# Patient Record
Sex: Female | Born: 1935 | Race: White | Hispanic: No | Smoking: Never smoker
Health system: Southern US, Community
[De-identification: ages and names within clinical notes are randomized; demographics above are authoritative.]

## PROBLEM LIST (undated history)

## (undated) DIAGNOSIS — G629 Polyneuropathy, unspecified: Secondary | ICD-10-CM

## (undated) DIAGNOSIS — I739 Peripheral vascular disease, unspecified: Secondary | ICD-10-CM

## (undated) DIAGNOSIS — Z9889 Other specified postprocedural states: Secondary | ICD-10-CM

## (undated) DIAGNOSIS — D649 Anemia, unspecified: Secondary | ICD-10-CM

## (undated) DIAGNOSIS — R55 Syncope and collapse: Secondary | ICD-10-CM

## (undated) DIAGNOSIS — R112 Nausea with vomiting, unspecified: Secondary | ICD-10-CM

## (undated) DIAGNOSIS — I10 Essential (primary) hypertension: Secondary | ICD-10-CM

## (undated) DIAGNOSIS — Z950 Presence of cardiac pacemaker: Secondary | ICD-10-CM

## (undated) DIAGNOSIS — K219 Gastro-esophageal reflux disease without esophagitis: Secondary | ICD-10-CM

## (undated) HISTORY — PX: COLONOSCOPY: SHX174

## (undated) HISTORY — DX: Anemia, unspecified: D64.9

## (undated) HISTORY — DX: Syncope and collapse: R55

## (undated) HISTORY — DX: Gastro-esophageal reflux disease without esophagitis: K21.9

---

## 1967-12-13 HISTORY — PX: APPENDECTOMY: SHX54

## 1967-12-13 HISTORY — PX: ABDOMINAL HYSTERECTOMY: SHX81

## 1999-12-13 HISTORY — PX: BACK SURGERY: SHX140

## 2002-12-12 HISTORY — PX: JOINT REPLACEMENT: SHX530

## 2002-12-12 HISTORY — PX: INSERT / REPLACE / REMOVE PACEMAKER: SUR710

## 2004-10-28 ENCOUNTER — Ambulatory Visit: Payer: Self-pay | Admitting: Unknown Physician Specialty

## 2004-11-08 ENCOUNTER — Ambulatory Visit: Payer: Self-pay | Admitting: Unknown Physician Specialty

## 2008-09-02 ENCOUNTER — Ambulatory Visit: Payer: Self-pay | Admitting: Specialist

## 2009-03-16 ENCOUNTER — Ambulatory Visit: Payer: Self-pay

## 2009-04-30 ENCOUNTER — Ambulatory Visit: Payer: Self-pay | Admitting: Specialist

## 2009-04-30 ENCOUNTER — Ambulatory Visit: Payer: Self-pay | Admitting: Cardiology

## 2009-05-07 ENCOUNTER — Ambulatory Visit: Payer: Self-pay | Admitting: Specialist

## 2009-08-06 ENCOUNTER — Ambulatory Visit: Payer: Self-pay | Admitting: Unknown Physician Specialty

## 2009-09-08 ENCOUNTER — Encounter: Payer: Self-pay | Admitting: Specialist

## 2009-09-11 ENCOUNTER — Encounter: Payer: Self-pay | Admitting: Specialist

## 2009-10-12 ENCOUNTER — Encounter: Payer: Self-pay | Admitting: Specialist

## 2010-01-17 ENCOUNTER — Inpatient Hospital Stay: Payer: Self-pay | Admitting: Internal Medicine

## 2010-01-22 ENCOUNTER — Ambulatory Visit: Payer: Self-pay | Admitting: Unknown Physician Specialty

## 2010-05-03 ENCOUNTER — Encounter: Payer: Self-pay | Admitting: Unknown Physician Specialty

## 2010-05-17 ENCOUNTER — Encounter: Payer: Self-pay | Admitting: Unknown Physician Specialty

## 2010-05-28 ENCOUNTER — Ambulatory Visit: Payer: Self-pay | Admitting: Neurological Surgery

## 2010-06-11 ENCOUNTER — Encounter: Payer: Self-pay | Admitting: Unknown Physician Specialty

## 2010-07-17 ENCOUNTER — Observation Stay: Payer: Self-pay | Admitting: Internal Medicine

## 2010-07-20 ENCOUNTER — Ambulatory Visit: Payer: Self-pay | Admitting: Neurology

## 2010-07-27 ENCOUNTER — Ambulatory Visit: Payer: Self-pay | Admitting: Unknown Physician Specialty

## 2010-09-06 ENCOUNTER — Encounter: Payer: Self-pay | Admitting: Internal Medicine

## 2010-09-11 ENCOUNTER — Encounter: Payer: Self-pay | Admitting: Internal Medicine

## 2010-10-07 ENCOUNTER — Ambulatory Visit: Payer: Self-pay | Admitting: Internal Medicine

## 2010-10-12 ENCOUNTER — Ambulatory Visit: Payer: Self-pay | Admitting: Internal Medicine

## 2010-10-15 ENCOUNTER — Ambulatory Visit: Payer: Self-pay | Admitting: Internal Medicine

## 2010-10-22 ENCOUNTER — Ambulatory Visit: Payer: Self-pay | Admitting: Internal Medicine

## 2010-10-29 ENCOUNTER — Ambulatory Visit: Payer: Self-pay | Admitting: Internal Medicine

## 2010-11-03 ENCOUNTER — Ambulatory Visit: Payer: Self-pay | Admitting: Internal Medicine

## 2010-11-10 ENCOUNTER — Ambulatory Visit: Payer: Self-pay | Admitting: Internal Medicine

## 2010-11-15 ENCOUNTER — Ambulatory Visit: Payer: Self-pay | Admitting: Internal Medicine

## 2010-11-18 ENCOUNTER — Ambulatory Visit: Payer: Self-pay | Admitting: Internal Medicine

## 2010-11-22 ENCOUNTER — Ambulatory Visit: Payer: Self-pay | Admitting: Internal Medicine

## 2010-11-29 ENCOUNTER — Ambulatory Visit: Payer: Self-pay | Admitting: Internal Medicine

## 2010-12-03 ENCOUNTER — Ambulatory Visit: Payer: Self-pay | Admitting: Internal Medicine

## 2010-12-08 ENCOUNTER — Ambulatory Visit: Payer: Self-pay

## 2011-03-09 ENCOUNTER — Encounter: Payer: Self-pay | Admitting: Neurological Surgery

## 2011-03-13 ENCOUNTER — Encounter: Payer: Self-pay | Admitting: Neurological Surgery

## 2011-04-12 ENCOUNTER — Encounter: Payer: Self-pay | Admitting: Neurological Surgery

## 2011-05-11 DIAGNOSIS — M069 Rheumatoid arthritis, unspecified: Secondary | ICD-10-CM | POA: Insufficient documentation

## 2011-10-11 ENCOUNTER — Encounter: Payer: Self-pay | Admitting: Neurological Surgery

## 2011-10-13 ENCOUNTER — Encounter: Payer: Self-pay | Admitting: Neurological Surgery

## 2011-11-21 ENCOUNTER — Ambulatory Visit: Payer: Self-pay | Admitting: Unknown Physician Specialty

## 2011-12-20 DIAGNOSIS — K602 Anal fissure, unspecified: Secondary | ICD-10-CM | POA: Insufficient documentation

## 2011-12-20 DIAGNOSIS — Z9889 Other specified postprocedural states: Secondary | ICD-10-CM | POA: Insufficient documentation

## 2012-04-02 ENCOUNTER — Ambulatory Visit: Payer: Self-pay | Admitting: Specialist

## 2012-04-04 ENCOUNTER — Ambulatory Visit: Payer: Self-pay | Admitting: Internal Medicine

## 2012-05-21 DIAGNOSIS — K8020 Calculus of gallbladder without cholecystitis without obstruction: Secondary | ICD-10-CM | POA: Insufficient documentation

## 2012-11-13 DIAGNOSIS — I447 Left bundle-branch block, unspecified: Secondary | ICD-10-CM | POA: Insufficient documentation

## 2012-11-21 ENCOUNTER — Ambulatory Visit: Payer: Self-pay | Admitting: Internal Medicine

## 2012-12-12 HISTORY — PX: PACEMAKER INSERTION: SHX728

## 2013-09-09 ENCOUNTER — Ambulatory Visit: Payer: Self-pay | Admitting: Unknown Physician Specialty

## 2013-11-12 DIAGNOSIS — I442 Atrioventricular block, complete: Secondary | ICD-10-CM | POA: Insufficient documentation

## 2013-11-22 ENCOUNTER — Ambulatory Visit: Payer: Self-pay | Admitting: Internal Medicine

## 2014-06-11 DIAGNOSIS — I10 Essential (primary) hypertension: Secondary | ICD-10-CM | POA: Diagnosis present

## 2014-06-11 DIAGNOSIS — D649 Anemia, unspecified: Secondary | ICD-10-CM | POA: Diagnosis present

## 2014-10-20 DIAGNOSIS — R1312 Dysphagia, oropharyngeal phase: Secondary | ICD-10-CM | POA: Insufficient documentation

## 2014-11-24 ENCOUNTER — Ambulatory Visit: Payer: Self-pay | Admitting: Internal Medicine

## 2015-01-07 ENCOUNTER — Ambulatory Visit: Payer: Self-pay | Admitting: Neurology

## 2015-01-29 ENCOUNTER — Ambulatory Visit: Payer: Self-pay | Admitting: Internal Medicine

## 2015-02-02 DIAGNOSIS — M5412 Radiculopathy, cervical region: Secondary | ICD-10-CM | POA: Insufficient documentation

## 2015-09-07 ENCOUNTER — Other Ambulatory Visit: Payer: Self-pay | Admitting: Internal Medicine

## 2015-09-07 DIAGNOSIS — Z1231 Encounter for screening mammogram for malignant neoplasm of breast: Secondary | ICD-10-CM

## 2015-09-10 ENCOUNTER — Encounter: Payer: Medicare Other | Attending: Surgery | Admitting: Surgery

## 2015-09-10 DIAGNOSIS — X58XXXA Exposure to other specified factors, initial encounter: Secondary | ICD-10-CM | POA: Diagnosis not present

## 2015-09-10 DIAGNOSIS — M109 Gout, unspecified: Secondary | ICD-10-CM | POA: Diagnosis not present

## 2015-09-10 DIAGNOSIS — M069 Rheumatoid arthritis, unspecified: Secondary | ICD-10-CM | POA: Diagnosis not present

## 2015-09-10 DIAGNOSIS — I10 Essential (primary) hypertension: Secondary | ICD-10-CM | POA: Diagnosis not present

## 2015-09-10 DIAGNOSIS — M1990 Unspecified osteoarthritis, unspecified site: Secondary | ICD-10-CM | POA: Insufficient documentation

## 2015-09-10 DIAGNOSIS — T23272A Burn of second degree of left wrist, initial encounter: Secondary | ICD-10-CM | POA: Insufficient documentation

## 2015-09-10 NOTE — Progress Notes (Signed)
[name], [name] ([id number]) Visit Report for 09/10/2015 Abuse/Suicide Risk Screen Details Patient Name: [name], [name] 09/10/2015 10:15 Date of Service: AM Medical Record [id number] Number: Patient Account Number: [id number] [birth year] (81 y.o. Treating RN: Clover Mealy, RN, BSN, [place] [facility] Date of Birth/Sex: Female) Other Clinician: Primary Care Physician: Aram Beecham Treating Britto, Errol Referring Physician: Physician/Extender: Tania Ade in Treatment: 0 Abuse/Suicide Risk Screen Items Answer ABUSE/SUICIDE RISK SCREEN: Has anyone close to you tried to hurt or harm you recentlyo No Do you feel uncomfortable with anyone in your familyo No Has anyone forced you do things that you didnot want to doo No Do you have any thoughts of harming yourselfo No Patient displays signs or symptoms of abuse and/or neglect. No Electronic Signature(s) Signed: 09/10/2015 10:28:19 AM By: Elpidio Eric BSN, RN Entered By: Elpidio Eric on 09/10/2015 10:28:19 [name] ([id number]) -------------------------------------------------------------------------------- Activities of Daily Living Details Patient Name: [name], [name] 09/10/2015 10:15 Date of Service: AM Medical Record [id number] Number: Patient Account Number: [id number] 10/15/1930 (81 y.o. Treating RN: Clover Mealy, RN, BSN, [place] [facility] Date of Birth/Sex: Female) Other Clinician: Primary Care Physician: Aram Beecham Treating Evlyn Kanner Referring Physician: Physician/Extender: Tania Ade in Treatment: 0 Activities of Daily Living Items Answer Activities of Daily Living (Please select one for each item) Drive Automobile Completely Able Take Medications Completely Able Use Telephone Completely Able Care for Appearance Completely Able Use Toilet Completely Able Bath / Shower Completely Able Dress Self Completely Able Feed Self Completely Able Walk Completely Able Get In / Out Bed Completely Able Housework Completely Able Prepare Meals Completely  Able Handle Money Completely Able Shop for Self Completely Able Electronic Signature(s) Signed: 09/10/2015 10:28:56 AM By: Elpidio Eric BSN, RN Entered By: Elpidio Eric on 09/10/2015 10:28:55 [name] ([id number]) -------------------------------------------------------------------------------- Education Assessment Details Patient Name: [name], [name] 09/10/2015 10:15 Date of Service: AM Medical Record [id number] Number: Patient Account Number: [id number] Jan 17, 1930 (81 y.o. Treating RN: Clover Mealy, RN, BSN, [place] Date of Birth/Sex: Female) Other Clinician: Primary Care Physician: Aram Beecham Treating Evlyn Kanner Referring Physician: Physician/Extender: Tania Ade in Treatment: 0 Primary Learner Assessed: Patient Learning Preferences/Education Level/Primary Language Learning Preference: Explanation Highest Education Level: College or Above Preferred Language: English Cognitive Barrier Assessment/Beliefs Language Barrier: No Physical Barrier Assessment Impaired Vision: No Impaired Hearing: Yes Hearing Aid Decreased Hand dexterity: No Knowledge/Comprehension Assessment Knowledge Level: High Comprehension Level: High Ability to understand written High instructions: Ability to understand verbal High instructions: Motivation Assessment Anxiety Level: Calm Cooperation: Cooperative Education Importance: Acknowledges Need Interest in Health Problems: Asks Questions Perception: Coherent Willingness to Engage in Self- High Management Activities: Readiness to Engage in Self- High Management Activities: Electronic Signature(s) Signed: 09/10/2015 10:29:44 AM By: Elpidio Eric BSN, RN Entered By: Elpidio Eric on 09/10/2015 10:29:43 [name] ([id number]) [name], [name] ([id number]) -------------------------------------------------------------------------------- Fall Risk Assessment Details Patient Name: [name], [name] 09/10/2015 10:15 Date of Service: AM Medical  Record [id number] Number: Patient Account Number: [id number] 1930-07-23 (81 y.o. Treating RN: Clover Mealy, RN, BSN, [place] [facility] Date of Birth/Sex: Female) Other Clinician: Primary Care Physician: Aram Beecham Treating Britto, Errol Referring Physician: Physician/Extender: Tania Ade in Treatment: 0 Fall Risk Assessment Items FALL RISK ASSESSMENT: History of falling - immediate or within 3 months 0 No Secondary diagnosis 0 No Ambulatory aid None/bed rest/wheelchair/nurse 0 Yes Crutches/cane/walker 0 No Furniture 0 No IV Access/Saline Lock 0 No Gait/Training Normal/bed rest/immobile 0 Yes Weak 0 No Impaired 0 No Mental Status Oriented to own ability 0 Yes Electronic Signature(s) Signed: 09/10/2015 10:29:54 AM By: Elpidio Eric BSN, RN Entered  ByElpidio Eric on 09/10/2015 10:29:54 [name] ([id number]) -------------------------------------------------------------------------------- Foot Assessment Details Patient Name: [name], [name] 09/10/2015 10:15 Date of Service: AM Medical Record [id number] Number: Patient Account Number: [id number] 22-Jul-1930 (81 y.o. Treating RN: Clover Mealy, RN, BSN, [place] Date of Birth/Sex: Female) Other Clinician: Primary Care Physician: Aram Beecham Treating Britto, Errol Referring Physician: Physician/Extender: Tania Ade in Treatment: 0 Foot Assessment Items Site Locations + = Sensation present, - = Sensation absent, C = Callus, U = Ulcer R = Redness, W = Warmth, M = Maceration, PU = Pre-ulcerative lesion F = Fissure, S = Swelling, D = Dryness Assessment Right: Left: Other Deformity: No No Prior Foot Ulcer: No No Prior Amputation: No No Charcot Joint: No No Ambulatory Status: Ambulatory Without Help Gait: Steady Electronic Signature(s) Signed: 09/10/2015 10:30:19 AM By: Elpidio Eric BSN, RN Entered By: Elpidio Eric on 09/10/2015 10:30:19 [name]  ([id number]) -------------------------------------------------------------------------------- Nutrition Risk Assessment Details Patient Name: [name], [name] 09/10/2015 10:15 Date of Service: AM Medical Record [id number] Number: Patient Account Number: [id number] 1930/08/28 (81 y.o. Treating RN: Afful, RN, BSN, [place] Date of Birth/Sex: Female) Other Clinician: Primary Care Physician: Aram Beecham Treating Britto, Errol Referring Physician: Physician/Extender: Tania Ade in Treatment: 0 Height (in): 61 Weight (lbs): 128 Body Mass Index (BMI): 24.2 Nutrition Risk Assessment Items NUTRITION RISK SCREEN: I have an illness or condition that made me change the kind and/or 0 No amount of food I eat I eat fewer than two meals per day 0 No I eat few fruits and vegetables, or milk products 0 No I have three or more drinks of beer, liquor or wine almost every day 0 No I have tooth or mouth problems that make it hard for me to eat 0 No I don't always have enough money to buy the food I need 0 No I eat alone most of the time 0 No I take three or more different prescribed or over-the-counter drugs a 0 No day Without wanting to, I have lost or gained 10 pounds in the last six 0 No months I am not always physically able to shop, cook and/or feed myself 0 No Nutrition Protocols Good Risk Protocol 0 No interventions needed Moderate Risk Protocol Electronic Signature(s) Signed: 09/10/2015 10:30:10 AM By: Elpidio Eric BSN, RN Entered By: Elpidio Eric on 09/10/2015 10:30:10

## 2015-09-10 NOTE — Progress Notes (Signed)
[name], [name] ([id number]) Visit Report for 09/10/2015 Allergy List Details Patient Name: [name], [name]. Date of Service: 09/10/2015 10:15 AM Medical Record Number: [id number] Patient Account Number: [id number] Date of Birth/Sex: 1930-05-26 (81 y.o. Female) Treating RN: Afful, RN, BSN, [place] Sink Primary Care Physician: Aram Beecham Other Clinician: Referring Physician: Treating Physician/Extender: Rudene Re in Treatment: 0 Allergies Active Allergies Penicillins Reaction: rash Severity: Moderate Sporanox Reaction: elevated LFT Severity: Moderate adhesive Reaction: skin alergy Severity: Moderate codeine Reaction: n/v Severity: Moderate Cipro Reaction: diarrhea Severity: Moderate Allergy Notes Electronic Signature(s) Signed: 09/10/2015 10:23:15 AM By: Elpidio Eric BSN, RN Entered By: Elpidio Eric on 09/10/2015 10:23:14 [name] ([id number]) -------------------------------------------------------------------------------- Arrival Information Details Patient Name: [name] Date of Service: 09/10/2015 10:15 AM Medical Record Number: [id number] Patient Account Number: [id number] Date of Birth/Sex: 24-Dec-1929 (81 y.o. Female) Treating RN: Clover Mealy, RN, BSN, [place] Sink Primary Care Physician: Aram Beecham Other Clinician: Referring Physician: Treating Physician/Extender: Rudene Re in Treatment: 0 Visit Information Patient Arrived: Ambulatory Arrival Time: 10:26 Accompanied By: SELF Transfer Assistance: None Patient Identification Verified: Yes Secondary Verification Process Yes Completed: Patient Requires Transmission-Based No Precautions: Patient Has Alerts: No History Since Last Visit Added or deleted any medications: No Any new allergies or adverse reactions: No Had a fall or experienced change in activities of daily living that may affect risk of falls: No Signs or symptoms of abuse/neglect since last visito No Hospitalized since last  visit: No Has Dressing in Place as Prescribed: Yes Pain Present Now: No Electronic Signature(s) Signed: 09/10/2015 10:27:21 AM By: Elpidio Eric BSN, RN Entered By: Elpidio Eric on 09/10/2015 10:27:21 [name] ([id number]) -------------------------------------------------------------------------------- Clinic Level of Care Assessment Details Patient Name: [name] Date of Service: 09/10/2015 10:15 AM Medical Record Number: [id number] Patient Account Number: [id number] Date of Birth/Sex: 1930-03-25 (81 y.o. Female) Treating RN: Afful, RN, BSN, [place] Sink Primary Care Physician: Aram Beecham Other Clinician: Referring Physician: Treating Physician/Extender: Rudene Re in Treatment: 0 Clinic Level of Care Assessment Items TOOL 2 Quantity Score []  - Use when only an EandM is performed on the INITIAL visit 0 ASSESSMENTS - Nursing Assessment / Reassessment X - General Physical Exam (combine w/ comprehensive assessment (listed just 1 20 below) when performed on new pt. evals) X - Comprehensive Assessment (HX, ROS, Risk Assessments, Wounds Hx, etc.) 1 25 ASSESSMENTS - Wound and Skin Assessment / Reassessment X - Simple Wound Assessment / Reassessment - one wound 1 5 []  - Complex Wound Assessment / Reassessment - multiple wounds 0 []  - Dermatologic / Skin Assessment (not related to wound area) 0 ASSESSMENTS - Ostomy and/or Continence Assessment and Care []  - Incontinence Assessment and Management 0 []  - Ostomy Care Assessment and Management (repouching, etc.) 0 PROCESS - Coordination of Care X - Simple Patient / Family Education for ongoing care 1 15 []  - Complex (extensive) Patient / Family Education for ongoing care 0 X - Staff obtains , Records, Test Results / Process Orders 1 10 []  - Staff telephones HHA, Nursing Homes / Clarify orders / etc 0 []  - Routine Transfer to another Facility (non-emergent condition) 0 []  - Routine Hospital Admission (non-emergent  condition) 0 []  - New Admissions / / Ordering NPWT, Apligraf, etc. 0 []  - Emergency Hospital Admission (emergent condition) 0 X - Simple Discharge Coordination 1 10 CATERIN, TABARES V. ( ) []  - Complex (extensive) Discharge Coordination 0 PROCESS - Special Needs []  - Pediatric / Minor Patient Management 0 []  - Isolation Patient Management 0 []  -  Hearing / Language / Visual special needs 0 []  - Assessment of Community assistance (transportation, D/C planning, etc.) 0 []  - Additional assistance / Altered mentation 0 []  - Support Surface(s) Assessment (bed, cushion, seat, etc.) 0 INTERVENTIONS - Wound Cleansing / Measurement X - Wound Imaging (photographs - any number of wounds) 1 5 []  - Wound Tracing (instead of photographs) 0 X - Simple Wound Measurement - one wound 1 5 []  - Complex Wound Measurement - multiple wounds 0 []  - Simple Wound Cleansing - one wound 0 []  - Complex Wound Cleansing - multiple wounds 0 INTERVENTIONS - Wound Dressings X - Small Wound Dressing one or multiple wounds 1 10 []  - Medium Wound Dressing one or multiple wounds 0 []  - Large Wound Dressing one or multiple wounds 0 []  - Application of Medications - injection 0 INTERVENTIONS - Miscellaneous []  - External ear exam 0 []  - Specimen Collection (cultures, biopsies, blood, body fluids, etc.) 0 []  - Specimen(s) / Culture(s) sent or taken to Lab for analysis 0 []  - Patient Transfer (multiple staff / / Similar devices) 0 []  - Simple Staple / Suture removal (25 or less) 0 []  - Complex Staple / Suture removal (26 or more) 0 DELAYNA, SPARLIN V. ( ) []  - Hypo / Hyperglycemic Management (close monitor of Blood Glucose) 0 []  - Ankle / Brachial Index (ABI) - do not check if billed separately 0 Has the patient been seen at the hospital within the last three years: Yes Total Score: 105 Level Of Care: New/Established - Level 3 Electronic Signature(s) Signed: 09/10/2015  10:42:40 AM By: BSN, RN Entered By: on 09/10/2015 10:42:40 ( ) -------------------------------------------------------------------------------- Encounter Discharge Information Details Patient Name: Date of Service: 09/10/2015 10:15 AM Medical Record Number: Patient Account Number: Date of Birth/Sex: 01-17-1930 (81 y.o. Female) Treating RN: , RN, BSN, Primary Care Physician: Candiss Norse Other Clinician: Referring Physician: Treating Physician/Extender: [id number] in Treatment: 0 Encounter Discharge Information Items Discharge Pain Level: 0 Discharge Condition: Stable Ambulatory Status: Ambulatory Discharge Destination: Home Transportation: Private Auto Accompanied By: self Schedule Follow-up Appointment: No Medication Reconciliation completed and provided to Patient/Care No Kirsta Probert: Provided on Clinical Summary of Care: 09/10/2015 Form Type Recipient Paper Patient [facility] Electronic Signature(s) Signed: 09/10/2015 10:50:08 AM By: Elpidio Eric Previous Signature: 09/10/2015 10:40:02 AM Version By: 09/12/2015 BSN, RN Entered By: [name] on 09/10/2015 10:50:08 [name] (09/12/2015) -------------------------------------------------------------------------------- Lower Extremity Assessment Details Patient Name: [id number] Date of Service: 09/10/2015 10:15 AM Medical Record Number: 13/05/1930 Patient Account Number: 05-05-2006 Date of Birth/Sex: 12-29-1929 (81 y.o. Female) Treating RN: Afful, RN, BSN, Aram Beecham Primary Care Physician: Rudene Re Other Clinician: Referring Physician: Treating Physician/Extender: 09/12/2015 in Treatment: 0 Electronic Signature(s) Signed: 09/10/2015 10:30:42 AM By: 09/12/2015 BSN, RN Entered By: Gwenlyn Perking on 09/10/2015 10:30:42 Elpidio Eric  (Gwenlyn Perking) -------------------------------------------------------------------------------- Multi Wound Chart Details Patient Name: 09/12/2015 Date of Service: 09/10/2015 10:15 AM Medical Record Number: [id number] Patient Account Number: [name] Date of Birth/Sex: [birth year] (81 y.o. Female) Treating RN: [id number], RN, BSN, 13/05/1930 Primary Care Physician: 05-05-2006 Other Clinician: Referring Physician: Treating Physician/Extender: [place] Sink in Treatment: 0 Vital Signs Height(in): 61 Pulse(bpm): 67 Weight(lbs): 128 Blood Pressure 111/42 (mmHg): Body Mass Index(BMI): 24 Temperature(F): 97.8 Respiratory Rate 16 (breaths/min): Photos: [2:No Photos] [N/A:N/A] Wound Location: [2:Left Wrist] [N/A:N/A] Wounding Event: [2:Shear/Friction] [N/A:N/A] Primary Etiology: [2:1st degree Burn] [N/A:N/A] Comorbid History: [2:Hypertension, Gout, Rheumatoid  Arthritis, Osteoarthritis] [N/A:N/A] Date Acquired: [2:09/02/2015] [N/A:N/A] Weeks of Treatment: [2:0] [N/A:N/A] Wound Status: [2:Open] [N/A:N/A] Measurements L x W x D 1x1.1x0.1 [N/A:N/A] (cm) Area (cm) : [2:0.864] [N/A:N/A] Volume (cm) : [2:0.086] [N/A:N/A] % Reduction in Area: [2:0.00%] [N/A:N/A] % Reduction in Volume: 0.00% [N/A:N/A] Classification: [2:Partial Thickness] [N/A:N/A] Exudate Amount: [2:Small] [N/A:N/A] Wound Margin: [2:Distinct, outline attached] [N/A:N/A] Granulation Amount: [2:Medium (34-66%)] [N/A:N/A] Granulation Quality: [2:Pale] [N/A:N/A] Necrotic Amount: [2:Small (1-33%)] [N/A:N/A] Exposed Structures: [2:Fascia: No Fat: No Tendon: No Muscle: No Joint: No Bone: No Limited to Skin Breakdown] [N/A:N/A] Epithelialization: None N/A N/A Periwound Skin Texture: Edema: No N/A N/A Excoriation: No Induration: No Callus: No Crepitus: No Fluctuance: No Friable: No Rash: No Scarring: No Periwound Skin Moist: Yes N/A N/A Moisture: Maceration: No Dry/Scaly: No Periwound Skin Color: Atrophie  Blanche: No N/A N/A Cyanosis: No Ecchymosis: No Erythema: No Hemosiderin Staining: No Mottled: No Pallor: No Rubor: No Temperature: No Abnormality N/A N/A Tenderness on No N/A N/A Palpation: Wound Preparation: Ulcer Cleansing: N/A N/A Rinsed/Irrigated with Saline Topical Anesthetic Applied: Other: LIDOCAINE 4% Treatment Notes Electronic Signature(s) Signed: 09/10/2015 10:38:36 AM By: Elpidio Eric BSN, RN Entered By: Elpidio Eric on 09/10/2015 10:38:36 [name] ([id number]) -------------------------------------------------------------------------------- Multi-Disciplinary Care Plan Details Patient Name: [name] Date of Service: 09/10/2015 10:15 AM Medical Record Number: [id number] Patient Account Number: [id number] Date of Birth/Sex: Mar 30, 1930 (81 y.o. Female) Treating RN: Afful, RN, BSN, [place] Sink Primary Care Physician: Aram Beecham Other Clinician: Referring Physician: Treating Physician/Extender: Rudene Re in Treatment: 0 Active Inactive Orientation to the Wound Care Program Nursing Diagnoses: Knowledge deficit related to the wound healing center program Goals: Patient/caregiver will verbalize understanding of the Wound Healing Center Program Date Initiated: 09/10/2015 Goal Status: Active Interventions: Provide education on orientation to the wound center Notes: Wound/Skin Impairment Nursing Diagnoses: Knowledge deficit related to ulceration/compromised skin integrity Goals: Patient/caregiver will verbalize understanding of skin care regimen Date Initiated: 09/10/2015 Goal Status: Active Ulcer/skin breakdown will have a volume reduction of 30% by week 4 Date Initiated: 09/10/2015 Goal Status: Active Ulcer/skin breakdown will have a volume reduction of 50% by week 8 Date Initiated: 09/10/2015 Goal Status: Active Ulcer/skin breakdown will have a volume reduction of 80% by week 12 Date Initiated: 09/10/2015 Goal Status: Active Ulcer/skin  breakdown will heal within 14 weeks Date Initiated: 09/10/2015 Goal Status: Active REITA, SHINDLER ([id number]) Interventions: Assess patient/caregiver ability to obtain necessary supplies Assess patient/caregiver ability to perform ulcer/skin care regimen upon admission and as needed Assess ulceration(s) every visit Provide education on ulcer and skin care Treatment Activities: Skin care regimen initiated : 09/10/2015 Topical wound management initiated : 09/10/2015 Notes: Electronic Signature(s) Signed: 09/10/2015 10:37:46 AM By: Elpidio Eric BSN, RN Entered By: Elpidio Eric on 09/10/2015 10:37:46 [name] ([id number]) -------------------------------------------------------------------------------- Pain Assessment Details Patient Name: [name] Date of Service: 09/10/2015 10:15 AM Medical Record Number: [id number] Patient Account Number: [id number] Date of Birth/Sex: 06-May-1930 (81 y.o. Female) Treating RN: Clover Mealy, RN, BSN, [place] Sink Primary Care Physician: Aram Beecham Other Clinician: Referring Physician: Treating Physician/Extender: Rudene Re in Treatment: 0 Active Problems Location of Pain Severity and Description of Pain Patient Has Paino No Site Locations Pain Management and Medication Current Pain Management: Electronic Signature(s) Signed: 09/10/2015 10:27:37 AM By: Elpidio Eric BSN, RN Entered By: Elpidio Eric on 09/10/2015 10:27:37 [name] ([id number]) -------------------------------------------------------------------------------- Patient/Caregiver Education Details Patient Name: [name] Date of Service: 09/10/2015 10:15 AM Medical Record Number: [id number] Patient Account Number: [id number] Date of Birth/Gender: 04-Aug-1930 (81 y.o.  Female) Treating RN: Clover Mealy, RN, BSN, [place] Sink Primary Care Physician: Aram Beecham Other Clinician: Referring Physician: Treating Physician/Extender: Rudene Re in Treatment: 0 Education  Assessment Education Provided To: Patient Education Topics Provided Basic Hygiene: Methods: Explain/Verbal Responses: State content correctly Welcome To The [facility]: Methods: Explain/Verbal Responses: State content correctly Wound/Skin Impairment: Methods: Explain/Verbal Responses: State content correctly Electronic Signature(s) Signed: 09/10/2015 10:40:21 AM By: Elpidio Eric BSN, RN Entered By: Elpidio Eric on 09/10/2015 10:40:21 [name] ([id number]) -------------------------------------------------------------------------------- Wound Assessment Details Patient Name: [name] Date of Service: 09/10/2015 10:15 AM Medical Record Number: [id number] Patient Account Number: [id number] Date of Birth/Sex: 02/09/1930 (81 y.o. Female) Treating RN: Afful, RN, BSN, [place] Primary Care Physician: Aram Beecham Other Clinician: Referring Physician: Treating Physician/Extender: Rudene Re in Treatment: 0 Wound Status Wound Number: 2 Primary 1st degree Burn Etiology: Wound Location: Left Wrist Wound Open Wounding Event: Shear/Friction Status: Date Acquired: 09/02/2015 Comorbid Hypertension, Gout, Rheumatoid Weeks Of Treatment: 0 History: Arthritis, Osteoarthritis Clustered Wound: No Photos Photo Uploaded By: Curtis Sites on 09/10/2015 10:58:07 Wound Measurements Length: (cm) 1 Width: (cm) 1.1 Depth: (cm) 0.1 Area: (cm) 0.864 Volume: (cm) 0.086 % Reduction in Area: 0% % Reduction in Volume: 0% Epithelialization: None Tunneling: No Undermining: No Wound Description Classification: Partial Thickness Wound Margin: Distinct, outline attached Exudate Amount: Small Foul Odor After Cleansing: No Wound Bed Granulation Amount: Medium (34-66%) Exposed Structure Granulation Quality: Pale Fascia Exposed: No Necrotic Amount: Small (1-33%) Fat Layer Exposed: No Necrotic Quality: Adherent Slough Tendon Exposed: No Muscle Exposed: No Joint  Exposed: No VIRA, BILSON ([id number]) Bone Exposed: No Limited to Skin Breakdown Periwound Skin Texture Texture Color No Abnormalities Noted: No No Abnormalities Noted: No Callus: No Atrophie Blanche: No Crepitus: No Cyanosis: No Excoriation: No Ecchymosis: No Fluctuance: No Erythema: No Friable: No Hemosiderin Staining: No Induration: No Mottled: No Localized Edema: No Pallor: No Rash: No Rubor: No Scarring: No Temperature / Pain Moisture Temperature: No Abnormality No Abnormalities Noted: No Dry / Scaly: No Maceration: No Moist: Yes Wound Preparation Ulcer Cleansing: Rinsed/Irrigated with Saline Topical Anesthetic Applied: Other: LIDOCAINE 4%, Electronic Signature(s) Signed: 09/10/2015 10:32:58 AM By: Elpidio Eric BSN, RN Entered By: Elpidio Eric on 09/10/2015 10:32:58 [name] ([id number]) -------------------------------------------------------------------------------- Vitals Details Patient Name: [name] Date of Service: 09/10/2015 10:15 AM Medical Record Number: [id number] Patient Account Number: [id number] Date of Birth/Sex: 04-27-1930 (81 y.o. Female) Treating RN: Afful, RN, BSN, [place] Primary Care Physician: Aram Beecham Other Clinician: Referring Physician: Treating Physician/Extender: Rudene Re in Treatment: 0 Vital Signs Time Taken: 10:27 Temperature (F): 97.8 Height (in): 61 Pulse (bpm): 67 Source: Stated Respiratory Rate (breaths/min): 16 Weight (lbs): 128 Blood Pressure (mmHg): 111/42 Source: Stated Reference Range: 80 - 120 mg / dl Body Mass Index (BMI): 24.2 Electronic Signature(s) Signed: 09/10/2015 10:33:44 AM By: Elpidio Eric BSN, RN Previous Signature: 09/10/2015 10:28:05 AM Version By: Elpidio Eric BSN, RN Entered By: Elpidio Eric on 09/10/2015 10:33:44

## 2015-09-11 NOTE — Progress Notes (Signed)
SUANN, EGGLETON ([id number]) Visit Report for 09/10/2015 Chief Complaint Document Details Patient Name: [name], [name] 09/10/2015 10:15 Date of Service: AM Medical Record [id number] Number: Patient Account Number: [id number] 12/06/1930 (81 y.o. Treating RN: Clover Mealy, RN, BSN, [place] Sink Date of Birth/Sex: Female) Other Clinician: Primary Care Physician: Aram Beecham Treating Evlyn Kanner Referring Physician: Physician/Extender: Tania Ade in Treatment: 0 Information Obtained from: Patient Chief Complaint Patient presents to the wound care center with burn wound too her left wrist which she sustained about a week ago. Electronic Signature(s) Signed: 09/10/2015 10:47:49 AM By: Evlyn Kanner MD, FACS Previous Signature: 09/10/2015 10:47:37 AM Version By: Evlyn Kanner MD, FACS Entered By: Evlyn Kanner on 09/10/2015 10:47:48 [name] ([id number]) -------------------------------------------------------------------------------- HPI Details Patient Name: [name], [name] 09/10/2015 10:15 Date of Service: AM Medical Record [id number] Number: Patient Account Number: [id number] June 28, 1930 (81 y.o. Treating RN: Afful, RN, BSN, [place] Sink Date of Birth/Sex: Female) Other Clinician: Primary Care Physician: Aram Beecham Treating Evlyn Kanner Referring Physician: Physician/Extender: Tania Ade in Treatment: 0 History of Present Illness Location: left medial wrist Quality: Patient reports experiencing an aching pain to affected area(s). Severity: Patient states wound (s) are getting better. Duration: Patient has had the wound for < 2 weeks prior to presenting for treatment Timing: Pain in wound is Intermittent (comes and goes Context: The wound occurred when the patient burned herself with a hot dish coming out of the abdomen Modifying Factors: Other treatment(s) tried include: has been applying some Neosporin ointment over this Associated Signs and Symptoms: Patient reports having: no bleeding or  discharge from the wound. HPI Description: 81 year old patient who is looking much younger than his stated age comes in with a history of having a burn to her medial left wrist which she sustained about a week ago. She has several medical comorbidities including degenerative arthritis, scoliosis, history of back surgery, pacemaker placement,AMA positive, ulnar neuropathy and left carpal tunnel syndrome. she is also had sclerotherapy for varicose veins in May 2003. her medications include some prednisone at the present time which she may be coming off soon. Electronic Signature(s) Signed: 09/10/2015 10:53:27 AM By: Evlyn Kanner MD, FACS Entered By: Evlyn Kanner on 09/10/2015 10:53:27 [name] ([id number]) -------------------------------------------------------------------------------- Physical Exam Details Patient Name: [name], [name] 09/10/2015 10:15 Date of Service: AM Medical Record [id number] Number: Patient Account Number: [id number] 08/31/1930 (81 y.o. Treating RN: Clover Mealy, RN, BSN, [place] Sink Date of Birth/Sex: Female) Other Clinician: Primary Care Physician: Aram Beecham Treating Evlyn Kanner Referring Physician: Physician/Extender: Tania Ade in Treatment: 0 Constitutional . Pulse regular. Respirations normal and unlabored. Afebrile. . Eyes Nonicteric. Reactive to light. Ears, Nose, Mouth, and Throat Lips, teeth, and gums WNL.Marland Kitchen Moist mucosa without lesions . Neck supple and nontender. No palpable supraclavicular or cervical adenopathy. Normal sized without goiter. Respiratory WNL. No retractions.. Cardiovascular Pedal Pulses WNL. No clubbing, cyanosis or edema. Gastrointestinal (GI) Abdomen without masses or tenderness.. No liver or spleen enlargement or tenderness.. Lymphatic No adneopathy. No adenopathy. No adenopathy. Musculoskeletal Adexa without tenderness or enlargement.. Digits and nails w/o clubbing, cyanosis, infection, petechiae, ischemia, or inflammatory  conditions.. Integumentary (Hair, Skin) No suspicious lesions. No crepitus or fluctuance. No peri-wound warmth or erythema. No masses.Marland Kitchen Psychiatric Judgement and insight Intact.. No evidence of depression, anxiety, or agitation.. Notes She has a second-degree burn on the medial left wrist which has brisk bleeding on gently wiping it off with saline gauze. Electronic Signature(s) Signed: 09/10/2015 10:54:14 AM By: Evlyn Kanner MD, FACS Entered By: Evlyn Kanner on 09/10/2015 10:54:12 [name] ([id number]) --------------------------------------------------------------------------------  Physician Orders Details Patient Name: [name], [name] 09/10/2015 10:15 Date of Service: AM Medical Record [id number] Number: Patient Account Number: [id number] 02-14-1930 (81 y.o. Treating RN: Clover Mealy, RN, BSN, [place] Sink Date of Birth/Sex: Female) Other Clinician: Primary Care Physician: Aram Beecham Treating Evlyn Kanner Referring Physician: Physician/Extender: Tania Ade in Treatment: 0 Verbal / Phone Orders: Yes Clinician: Afful, RN, BSN, [place] Read Back and Verified: Yes Diagnosis Coding Wound Cleansing Wound #2 Left Wrist o Cleanse wound with mild soap and water o May Shower, gently pat wound dry prior to applying new dressing. o May shower with protection. Anesthetic Wound #2 Left Wrist o Topical Lidocaine 4% cream applied to wound bed prior to debridement Primary Wound Dressing o Silvadene Cream Secondary Dressing Wound #2 Left Wrist o Gauze and Kerlix/Conform Dressing Change Frequency Wound #2 Left Wrist o Change dressing every day. Follow-up Appointments Wound #2 Left Wrist o Return Appointment in 1 week. Electronic Signature(s) Signed: 09/10/2015 10:41:26 AM By: Elpidio Eric BSN, RN Signed: 09/10/2015 5:16:51 PM By: Evlyn Kanner MD, FACS Entered By: Elpidio Eric on 09/10/2015 10:41:26 [name]  ([id number]) -------------------------------------------------------------------------------- Problem List Details Patient Name: [name], [name] 09/10/2015 10:15 Date of Service: AM Medical Record [id number] Number: Patient Account Number: [id number] July 23, 1930 (81 y.o. Treating RN: Clover Mealy, RN, BSN, [place] Sink Date of Birth/Sex: Female) Other Clinician: Primary Care Physician: Aram Beecham Treating Evlyn Kanner Referring Physician: Physician/Extender: Tania Ade in Treatment: 0 Active Problems ICD-10 Encounter Code Description Active Date Diagnosis T23.272A Burn of second degree of left wrist, initial encounter 09/10/2015 Yes Inactive Problems Resolved Problems Electronic Signature(s) Signed: 09/10/2015 10:47:02 AM By: Evlyn Kanner MD, FACS Entered By: Evlyn Kanner on 09/10/2015 10:47:02 [name] ([id number]) -------------------------------------------------------------------------------- Progress Note Details Patient Name: [name], [name] 09/10/2015 10:15 Date of Service: AM Medical Record [id number] Number: Patient Account Number: [id number] 06/04/1930 (81 y.o. Treating RN: Clover Mealy, RN, BSN, [place] Sink Date of Birth/Sex: Female) Other Clinician: Primary Care Physician: Aram Beecham Treating Evlyn Kanner Referring Physician: Physician/Extender: Tania Ade in Treatment: 0 Subjective Chief Complaint Information obtained from Patient Patient presents to the wound care center with burn wound too her left wrist which she sustained about a week ago. History of Present Illness (HPI) The following HPI elements were documented for the patient's wound: Location: left medial wrist Quality: Patient reports experiencing an aching pain to affected area(s). Severity: Patient states wound (s) are getting better. Duration: Patient has had the wound for < 2 weeks prior to presenting for treatment Timing: Pain in wound is Intermittent (comes and goes Context: The wound occurred when the  patient burned herself with a hot dish coming out of the abdomen Modifying Factors: Other treatment(s) tried include: has been applying some Neosporin ointment over this Associated Signs and Symptoms: Patient reports having: no bleeding or discharge from the wound. 81 year old patient who is looking much younger than his stated age comes in with a history of having a burn to her medial left wrist which she sustained about a week ago. She has several medical comorbidities including degenerative arthritis, scoliosis, history of back surgery, pacemaker placement,AMA positive, ulnar neuropathy and left carpal tunnel syndrome. she is also had sclerotherapy for varicose veins in May 2003. her medications include some prednisone at the present time which she may be coming off soon. Patient History Information obtained from Patient. Allergies Penicillins (Severity: Moderate, Reaction: rash), Sporanox (Severity: Moderate, Reaction: elevated LFT), adhesive (Severity: Moderate, Reaction: skin alergy), codeine (Severity: Moderate, Reaction: n/Barnett), Cipro (Severity: Moderate, Reaction: diarrhea) Family History Cancer - Mother, Siblings,  Diabetes - Father, Heart Disease - Father, Siblings, Hypertension - Father, Siblings, Lung Disease - Siblings, [name], [name] ([id number]) No family history of Hereditary Spherocytosis, Kidney Disease, Seizures, Stroke, Thyroid Problems, Tuberculosis. Social History Never smoker, Marital Status - Married, Alcohol Use - Never, Drug Use - No History, Caffeine Use - Never. Medical And Surgical History Notes Ear/Nose/Mouth/Throat bilateral hearing aides Cardiovascular pacemaker Review of Systems (ROS) Eyes Complains or has symptoms of Glasses / Contacts. Ear/Nose/Mouth/Throat The patient has no complaints or symptoms. Hematologic/Lymphatic The patient has no complaints or symptoms. Respiratory The patient has no complaints or symptoms. Cardiovascular The patient  has no complaints or symptoms. Gastrointestinal The patient has no complaints or symptoms. Endocrine The patient has no complaints or symptoms. Genitourinary The patient has no complaints or symptoms. Immunological The patient has no complaints or symptoms. Integumentary (Skin) Complains or has symptoms of Wounds. Musculoskeletal The patient has no complaints or symptoms. Neurologic The patient has no complaints or symptoms. Oncologic The patient has no complaints or symptoms. Psychiatric The patient has no complaints or symptoms. List of medications: vitamin B12, Tylenol, prednisone, vitamin D, omeprazole, Cozaar, amlodipine, multivitamin, oropharyngeal, vitamin C, Bystolic, Neurontin. [name], Natasha VMarland Kitchen ([id number]) Objective Constitutional Pulse regular. Respirations normal and unlabored. Afebrile. Vitals Time Taken: 10:27 AM, Height: 61 in, Source: Stated, Weight: 128 lbs, Source: Stated, BMI: 24.2, Temperature: 97.8 F, Pulse: 67 bpm, Respiratory Rate: 16 breaths/min, Blood Pressure: 111/42 mmHg. Eyes Nonicteric. Reactive to light. Ears, Nose, Mouth, and Throat Lips, teeth, and gums WNL.Marland Kitchen Moist mucosa without lesions . Neck supple and nontender. No palpable supraclavicular or cervical adenopathy. Normal sized without goiter. Respiratory WNL. No retractions.. Cardiovascular Pedal Pulses WNL. No clubbing, cyanosis or edema. Gastrointestinal (GI) Abdomen without masses or tenderness.. No liver or spleen enlargement or tenderness.. Lymphatic No adneopathy. No adenopathy. No adenopathy. Musculoskeletal Adexa without tenderness or enlargement.. Digits and nails w/o clubbing, cyanosis, infection, petechiae, ischemia, or inflammatory conditions.Marland Kitchen Psychiatric Judgement and insight Intact.. No evidence of depression, anxiety, or agitation.. General Notes: She has a second-degree burn on the medial left wrist which has brisk bleeding on gently wiping it off with saline  gauze. Integumentary (Hair, Skin) No suspicious lesions. No crepitus or fluctuance. No peri-wound warmth or erythema. No masses.Marland Kitchen [name], [name] ([id number]) Wound #2 status is Open. Original cause of wound was Shear/Friction. The wound is located on the Left Wrist. The wound measures 1cm length x 1.1cm width x 0.1cm depth; 0.864cm^2 area and 0.086cm^3 volume. The wound is limited to skin breakdown. There is no tunneling or undermining noted. There is a small amount of drainage noted. The wound margin is distinct with the outline attached to the wound base. There is medium (34-66%) pale granulation within the wound bed. There is a small (1-33%) amount of necrotic tissue within the wound bed including Adherent Slough. The periwound skin appearance exhibited: Moist. The periwound skin appearance did not exhibit: Callus, Crepitus, Excoriation, Fluctuance, Friable, Induration, Localized Edema, Rash, Scarring, Dry/Scaly, Maceration, Atrophie Blanche, Cyanosis, Ecchymosis, Hemosiderin Staining, Mottled, Pallor, Rubor, Erythema. Periwound temperature was noted as No Abnormality. Assessment Active Problems ICD-10 T23.272A - Burn of second degree of left wrist, initial encounter The patient has a second-degree burn on the left medial wrist and I have recommended application of Silvadene ointment and daily basis and a bordered foam over this. She can take a shower daily wash with soap and water pat it dry and then do her dressing. She will come back and see me in a week's time.  Plan Wound Cleansing: Wound #2 Left Wrist: Cleanse wound with mild soap and water May Shower, gently pat wound dry prior to applying new dressing. May shower with protection. Anesthetic: Wound #2 Left Wrist: Topical Lidocaine 4% cream applied to wound bed prior to debridement Primary Wound Dressing: Silvadene Cream Secondary Dressing: Wound #2 Left Wrist: Gauze and Kerlix/Conform Dressing Change Frequency: Wound #2  Left Wrist: [name], [name] ([id number]) Change dressing every day. Follow-up Appointments: Wound #2 Left Wrist: Return Appointment in 1 week. The patient has a second-degree burn on the left medial wrist and I have recommended application of Silvadene ointment and daily basis and a bordered foam over this. She can take a shower daily wash with soap and water pat it dry and then do her dressing. She will come back and see me in a week's time. Electronic Signature(s) Signed: 09/10/2015 10:57:04 AM By: Evlyn Kanner MD, FACS Entered By: Evlyn Kanner on 09/10/2015 10:57:04 [name] ([id number]) -------------------------------------------------------------------------------- ROS/PFSH Details Patient Name: [name], [name] 09/10/2015 10:15 Date of Service: AM Medical Record [id number] Number: Patient Account Number: [id number] 12-03-1930 (81 y.o. Treating RN: Clover Mealy, RN, BSN, [place] Sink Date of Birth/Sex: Female) Other Clinician: Primary Care Physician: Aram Beecham Treating Evlyn Kanner Referring Physician: Physician/Extender: Tania Ade in Treatment: 0 Information Obtained From Patient Wound History Do you currently have one or more open woundso Yes Eyes Complaints and Symptoms: Positive for: Glasses / Contacts Medical History: Negative for: Cataracts; Glaucoma; Optic Neuritis Integumentary (Skin) Complaints and Symptoms: Positive for: Wounds Ear/Nose/Mouth/Throat Complaints and Symptoms: No Complaints or Symptoms Medical History: Negative for: Chronic sinus problems/congestion; Middle ear problems Past Medical History Notes: bilateral hearing aides Hematologic/Lymphatic Complaints and Symptoms: No Complaints or Symptoms Medical History: Negative for: Anemia; Hemophilia; Human Immunodeficiency Virus; Lymphedema; Sickle Cell Disease Respiratory Complaints and Symptoms: No Complaints or Symptoms KLAUDIA, BEIRNE ([id number]) Medical History: Negative for: Aspiration;  Asthma; Chronic Obstructive Pulmonary Disease (COPD); Pneumothorax; Sleep Apnea; Tuberculosis Cardiovascular Complaints and Symptoms: No Complaints or Symptoms Medical History: Positive for: Hypertension Negative for: Angina; Arrhythmia; Congestive Heart Failure; Coronary Artery Disease; Deep Vein Thrombosis; Hypotension; Myocardial Infarction; Peripheral Arterial Disease; Peripheral Venous Disease; Phlebitis; Vasculitis Past Medical History Notes: pacemaker Gastrointestinal Complaints and Symptoms: No Complaints or Symptoms Medical History: Negative for: Cirrhosis ; Colitis; Crohnos; Hepatitis A; Hepatitis B; Hepatitis C Endocrine Complaints and Symptoms: No Complaints or Symptoms Medical History: Negative for: Type I Diabetes; Type II Diabetes Genitourinary Complaints and Symptoms: No Complaints or Symptoms Medical History: Negative for: End Stage Renal Disease Immunological Complaints and Symptoms: No Complaints or Symptoms Medical History: Negative for: Lupus Erythematosus; Raynaudos; Scleroderma Musculoskeletal LEVIE, OWENSBY ([id number]) Complaints and Symptoms: No Complaints or Symptoms Medical History: Positive for: Gout; Rheumatoid Arthritis; Osteoarthritis Negative for: Osteomyelitis Neurologic Complaints and Symptoms: No Complaints or Symptoms Medical History: Negative for: Dementia; Neuropathy; Quadriplegia; Paraplegia; Seizure Disorder Oncologic Complaints and Symptoms: No Complaints or Symptoms Medical History: Negative for: Received Chemotherapy; Received Radiation Psychiatric Complaints and Symptoms: No Complaints or Symptoms Medical History: Negative for: Anorexia/bulimia; Confinement Anxiety Family and Social History Cancer: Yes - Mother, Siblings; Diabetes: Yes - Father; Heart Disease: Yes - Father, Siblings; Hereditary Spherocytosis: No; Hypertension: Yes - Father, Siblings; Kidney Disease: No; Lung Disease: Yes - Siblings; Seizures: No;  Stroke: No; Thyroid Problems: No; Tuberculosis: No; Never smoker; Marital Status - Married; Alcohol Use: Never; Drug Use: No History; Caffeine Use: Never; Financial Concerns: No; Food, Clothing or Shelter Needs: No; Support System Lacking: No; Transportation Concerns: No; Advanced Directives: No; Patient  does not want information on Advanced Directives; Medical Power of Attorney: Yes - Demyah Smyre (Copy provided) Electronic Signature(s) Signed: 09/10/2015 10:24:21 AM By: Elpidio Eric BSN, RN Signed: 09/10/2015 5:16:51 PM By: Evlyn Kanner MD, FACS Entered By: Elpidio Eric on 09/10/2015 10:24:20 [name] ([id number]) -------------------------------------------------------------------------------- SuperBill Details Patient Name: [name] Date of Service: 09/10/2015 Medical Record Patient Account Number: [id number] Number: Afful, RN, BSN, Treating RN: [birth year] (81 y.o. [place] Sink Date of Birth/Sex: Female) Other Clinician: Primary Care Physician: Aram Beecham Treating Evlyn Kanner Referring Physician: Physician/Extender: Tania Ade in Treatment: 0 Diagnosis Coding ICD-10 Codes Code Description T23.272A Burn of second degree of left wrist, initial encounter Facility Procedures CPT4 Code: 41660630 Description: 99213 - WOUND CARE VISIT-LEV 3 EST PT Modifier: Quantity: 1 Physician Procedures CPT4 Code: 1601093 Description: 99214 - WC PHYS LEVEL 4 - EST PT ICD-10 Description Diagnosis T23.272A Burn of second degree of left wrist, initial enco Modifier: unter Quantity: 1 Electronic Signature(s) Signed: 09/10/2015 10:57:40 AM By: Evlyn Kanner MD, FACS Previous Signature: 09/10/2015 10:42:51 AM Version By: Elpidio Eric BSN, RN Entered By: Evlyn Kanner on 09/10/2015 10:57:40

## 2015-09-16 ENCOUNTER — Ambulatory Visit: Payer: Self-pay | Admitting: Surgery

## 2015-09-17 ENCOUNTER — Encounter: Payer: Self-pay | Admitting: General Surgery

## 2015-09-17 ENCOUNTER — Encounter: Payer: Medicare Other | Attending: General Surgery | Admitting: General Surgery

## 2015-09-17 DIAGNOSIS — T23272A Burn of second degree of left wrist, initial encounter: Secondary | ICD-10-CM | POA: Diagnosis present

## 2015-09-17 DIAGNOSIS — S51802D Unspecified open wound of left forearm, subsequent encounter: Secondary | ICD-10-CM

## 2015-09-17 DIAGNOSIS — X58XXXA Exposure to other specified factors, initial encounter: Secondary | ICD-10-CM | POA: Insufficient documentation

## 2015-09-17 NOTE — Progress Notes (Addendum)
RIFKA, THOLE ([id number]) Visit Report for 09/17/2015 Arrival Information Details Patient Name: [name], [name]. Date of Service: 09/17/2015 10:15 AM Medical Record Number: [id number] Patient Account Number: [id number] Date of Birth/Sex: 03/25/1930 (81 y.o. Female) Treating RN: Afful, RN, BSN, [place] Sink Primary Care Physician: Aram Beecham Other Clinician: Referring Physician: Aram Beecham Treating Physician/Extender: Elayne Snare in Treatment: 1 Visit Information History Since Last Visit Any new allergies or adverse reactions: No Patient Arrived: Ambulatory Had a fall or experienced change in No Arrival Time: 10:15 activities of daily living that may affect Accompanied By: self risk of falls: Transfer Assistance: None Signs or symptoms of abuse/neglect since last No Patient Identification Verified: Yes visito Secondary Verification Process Yes Hospitalized since last visit: No Completed: Has Dressing in Place as Prescribed: Yes Patient Requires Transmission-Based No Pain Present Now: No Precautions: Patient Has Alerts: No Electronic Signature(s) Signed: 09/17/2015 10:16:22 AM By: Elpidio Eric BSN, RN Entered By: Elpidio Eric on 09/17/2015 10:16:22 [name] ([id number]) -------------------------------------------------------------------------------- Clinic Level of Care Assessment Details Patient Name: [name] Date of Service: 09/17/2015 10:15 AM Medical Record Number: [id number] Patient Account Number: [id number] Date of Birth/Sex: 05-Jul-1930 (81 y.o. Female) Treating RN: Afful, RN, BSN, [place] Sink Primary Care Physician: Aram Beecham Other Clinician: Referring Physician: Aram Beecham Treating Physician/Extender: Elayne Snare in Treatment: 1 Clinic Level of Care Assessment Items TOOL 4 Quantity Score []  - Use when only an EandM is performed on FOLLOW-UP visit 0 ASSESSMENTS - Nursing Assessment / Reassessment X - Reassessment of  Co-morbidities (includes updates in patient status) 1 10 X - Reassessment of Adherence to Treatment Plan 1 5 ASSESSMENTS - Wound and Skin Assessment / Reassessment X - Simple Wound Assessment / Reassessment - one wound 1 5 []  - Complex Wound Assessment / Reassessment - multiple wounds 0 []  - Dermatologic / Skin Assessment (not related to wound area) 0 ASSESSMENTS - Focused Assessment []  - Circumferential Edema Measurements - multi extremities 0 []  - Nutritional Assessment / Counseling / Intervention 0 []  - Lower Extremity Assessment (monofilament, tuning fork, pulses) 0 []  - Peripheral Arterial Disease Assessment (using hand held doppler) 0 ASSESSMENTS - Ostomy and/or Continence Assessment and Care []  - Incontinence Assessment and Management 0 []  - Ostomy Care Assessment and Management (repouching, etc.) 0 PROCESS - Coordination of Care []  - Simple Patient / Family Education for ongoing care 0 []  - Complex (extensive) Patient / Family Education for ongoing care 0 []  - Staff obtains Chiropractor, Records, Test Results / Process Orders 0 []  - Staff telephones HHA, Nursing Homes / Clarify orders / etc 0 []  - Routine Transfer to another Facility (non-emergent condition) 0 ESTEFANY, JANTZ ([id number]) []  - Routine Hospital Admission (non-emergent condition) 0 []  - New Admissions / Manufacturing engineer / Ordering NPWT, Apligraf, etc. 0 []  - Emergency Hospital Admission (emergent condition) 0 X - Simple Discharge Coordination 1 10 []  - Complex (extensive) Discharge Coordination 0 PROCESS - Special Needs []  - Pediatric / Minor Patient Management 0 []  - Isolation Patient Management 0 []  - Hearing / Language / Visual special needs 0 []  - Assessment of Community assistance (transportation, D/C planning, etc.) 0 []  - Additional assistance / Altered mentation 0 []  - Support Surface(s) Assessment (bed, cushion, seat, etc.) 0 INTERVENTIONS - Wound Cleansing / Measurement []  - Simple Wound  Cleansing - one wound 0 []  - Complex Wound Cleansing - multiple wounds 0 X - Wound Imaging (photographs - any number of wounds) 1 5 []  - Wound Tracing (instead  of photographs) 0 []  - Simple Wound Measurement - one wound 0 []  - Complex Wound Measurement - multiple wounds 0 INTERVENTIONS - Wound Dressings []  - Small Wound Dressing one or multiple wounds 0 []  - Medium Wound Dressing one or multiple wounds 0 []  - Large Wound Dressing one or multiple wounds 0 []  - Application of Medications - topical 0 []  - Application of Medications - injection 0 INTERVENTIONS - Miscellaneous []  - External ear exam 0 HILARI, WETHINGTON ( ) []  - Specimen Collection (cultures, biopsies, blood, body fluids, etc.) 0 []  - Specimen(s) / Culture(s) sent or taken to Lab for analysis 0 []  - Patient Transfer (multiple staff / Lift / Similar devices) 0 []  - Simple Staple / Suture removal (25 or less) 0 []  - Complex Staple / Suture removal (26 or more) 0 []  - Hypo / Hyperglycemic Management (close monitor of Blood Glucose) 0 []  - Ankle / Brachial Index (ABI) - do not check if billed separately 0 X - Vital Signs 1 5 Has the patient been seen at the hospital within the last three years: Yes Total Score: 40 Level Of Care: New/Established - Level 2 Electronic Signature(s) Signed: 09/17/2015 10:30:12 AM By: BSN, RN Entered By: on 09/17/2015 10:30:10 ([name]) -------------------------------------------------------------------------------- Encounter Discharge Information Details Patient Name: [id number] Date of Service: 09/17/2015 10:15 AM Medical Record Number: Patient Account Number: Date of Birth/Sex: 08-20-1930 (81 y.o. Female) Treating RN: , RN, BSN, Primary Care Physician: Other Clinician: Referring Physician: 11/17/2015 Treating Physician/Extender: Elpidio Eric in Treatment: 1 Encounter  Discharge Information Items Discharge Pain Level: 0 Discharge Condition: Stable Ambulatory Status: Ambulatory Discharge Destination: Home Transportation: Private Auto Accompanied By: self Schedule Follow-up Appointment: No Medication Reconciliation completed and provided to Patient/Care No Tahj Lindseth: Provided on Clinical Summary of Care: 09/17/2015 Form Type Recipient Paper Patient [facility] Electronic Signature(s) Signed: 09/17/2015 5:11:41 PM By: [id number] MD Previous Signature: 09/17/2015 10:30:37 AM Version By: 11/17/2015 BSN, RN Previous Signature: 09/17/2015 10:29:10 AM Version By: [id number] Entered By: 13/05/1930 on 09/17/2015 12:31:47 Clover Mealy ([place] Sink) -------------------------------------------------------------------------------- Lower Extremity Assessment Details Patient Name: Aram Beecham Date of Service: 09/17/2015 10:15 AM Medical Record Number: Elayne Snare Patient Account Number: 11/17/2015 Date of Birth/Sex: [birth year] (81 y.o. Female) Treating RN: Afful, RN, BSN, Ardath Sax Primary Care Physician: 11/17/2015 Other Clinician: Referring Physician: Elpidio Eric Treating Physician/Extender: 11/17/2015 in Treatment: 1 Electronic Signature(s) Signed: 09/17/2015 10:17:50 AM By: Ardath Sax BSN, RN Entered By: 11/17/2015 on 09/17/2015 10:17:50 [id number] ([name]) -------------------------------------------------------------------------------- Multi Wound Chart Details Patient Name: 11/17/2015 Date of Service: 09/17/2015 10:15 AM Medical Record Number: [id number] Patient Account Number: 13/05/1930 Date of Birth/Sex: 1930-06-20 (81 y.o. Female) Treating RN: Aram Beecham, RN, BSN, Aram Beecham Primary Care Physician: Elayne Snare Other Clinician: Referring Physician: 11/17/2015 Treating Physician/Extender: Elpidio Eric in Treatment: 1 Vital Signs Height(in): 61 Pulse(bpm): 59 Weight(lbs): 128 Blood  Pressure 132/48 (mmHg): Body Mass Index(BMI): 24 Temperature(F): 97.8 Respiratory Rate 16 (breaths/min): Photos: [2:No Photos] [N/A:N/A] Wound Location: [2:Left Wrist] [N/A:N/A] Wounding Event: [2:Shear/Friction] [N/A:N/A] Primary Etiology: [2:2nd degree Burn] [N/A:N/A] Comorbid History: [2:Hypertension, Gout, Rheumatoid Arthritis, Osteoarthritis] [N/A:N/A] Date Acquired: [2:09/02/2015] [N/A:N/A] Weeks of Treatment: [2:1] [N/A:N/A] Wound Status: [2:Healed - Epithelialized] [N/A:N/A] Measurements L x W x D 0x0x0 [N/A:N/A] (cm) Area (cm) : [2:0] [N/A:N/A] Volume (cm) : [2:0] [N/A:N/A] % Reduction in Area: [2:100.00%] [N/A:N/A] % Reduction in Volume: 100.00% [N/A:N/A] Classification: [  2:Partial Thickness] [N/A:N/A] Exudate Amount: [2:None Present] [N/A:N/A] Wound Margin: [2:Flat and Intact] [N/A:N/A] Granulation Amount: [2:Large (67-100%)] [N/A:N/A] Necrotic Amount: [2:None Present (0%)] [N/A:N/A] Exposed Structures: [2:Fascia: No Fat: No Tendon: No Muscle: No Joint: No Bone: No Limited to Skin Breakdown] [N/A:N/A] Epithelialization: [2:Large (67-100%)] [N/A:N/A] Periwound Skin Texture: Edema: No N/A N/A Excoriation: No Induration: No Callus: No Crepitus: No Fluctuance: No Friable: No Rash: No Scarring: No Periwound Skin Dry/Scaly: Yes N/A N/A Moisture: Maceration: No Moist: No Periwound Skin Color: Atrophie Blanche: No N/A N/A Cyanosis: No Ecchymosis: No Erythema: No Hemosiderin Staining: No Mottled: No Pallor: No Rubor: No Temperature: No Abnormality N/A N/A Tenderness on No N/A N/A Palpation: Wound Preparation: Ulcer Cleansing: N/A N/A Rinsed/Irrigated with Saline Topical Anesthetic Applied: None Treatment Notes Electronic Signature(s) Signed: 09/17/2015 10:29:33 AM By: Elpidio Eric BSN, RN Entered By: Elpidio Eric on 09/17/2015 10:29:32 [name]  ([id number]) -------------------------------------------------------------------------------- Multi-Disciplinary Care Plan Details Patient Name: [name] Date of Service: 09/17/2015 10:15 AM Medical Record Number: [id number] Patient Account Number: [id number] Date of Birth/Sex: August 07, 1930 (81 y.o. Female) Treating RN: Afful, RN, BSN, [place] Sink Primary Care Physician: Aram Beecham Other Clinician: Referring Physician: Aram Beecham Treating Physician/Extender: Elayne Snare in Treatment: 1 Active Inactive Electronic Signature(s) Signed: 09/17/2015 10:26:26 AM By: Elpidio Eric BSN, RN Previous Signature: 09/17/2015 10:08:00 AM Version By: Elpidio Eric BSN, RN Entered By: Elpidio Eric on 09/17/2015 10:26:25 [name] ([id number]) -------------------------------------------------------------------------------- Pain Assessment Details Patient Name: [name] Date of Service: 09/17/2015 10:15 AM Medical Record Number: [id number] Patient Account Number: [id number] Date of Birth/Sex: [birth year] (81 y.o. Female) Treating RN: Clover Mealy, RN, BSN, [place] Sink Primary Care Physician: Aram Beecham Other Clinician: Referring Physician: Aram Beecham Treating Physician/Extender: Elayne Snare in Treatment: 1 Active Problems Location of Pain Severity and Description of Pain Patient Has Paino No Site Locations Pain Management and Medication Current Pain Management: Electronic Signature(s) Signed: 09/17/2015 10:17:39 AM By: Elpidio Eric BSN, RN Entered By: Elpidio Eric on 09/17/2015 10:17:39 [name] ([id number]) -------------------------------------------------------------------------------- Patient/Caregiver Education Details Patient Name: [name] Date of Service: 09/17/2015 10:15 AM Medical Record Number: [id number] Patient Account Number: [id number] Date of Birth/Gender: [birth year] (81 y.o. Female) Treating RN: Clover Mealy, RN, BSN, [place] Sink Primary Care Physician:  Aram Beecham Other Clinician: Referring Physician: Aram Beecham Treating Physician/Extender: Elayne Snare in Treatment: 1 Education Assessment Education Provided To: Patient Education Topics Provided Basic Hygiene: Methods: Explain/Verbal Responses: State content correctly Wound/Skin Impairment: Methods: Explain/Verbal Responses: State content correctly Electronic Signature(s) Signed: 09/17/2015 5:11:41 PM By: Ardath Sax MD Previous Signature: 09/17/2015 10:30:54 AM Version By: Elpidio Eric BSN, RN Entered By: Ardath Sax on 09/17/2015 12:31:58 [name] ([id number]) -------------------------------------------------------------------------------- Wound Assessment Details Patient Name: [name] Date of Service: 09/17/2015 10:15 AM Medical Record Number: [id number] Patient Account Number: [id number] Date of Birth/Sex: [birth year] (81 y.o. Female) Treating RN: Afful, RN, BSN, [place] Sink Primary Care Physician: Aram Beecham Other Clinician: Referring Physician: Aram Beecham Treating Physician/Extender: Elayne Snare in Treatment: 1 Wound Status Wound Number: 2 Primary 2nd degree Burn Etiology: Wound Location: Left Wrist Wound Healed - Epithelialized Wounding Event: Shear/Friction Status: Date Acquired: 09/02/2015 Comorbid Hypertension, Gout, Rheumatoid Weeks Of Treatment: 1 History: Arthritis, Osteoarthritis Clustered Wound: No Photos Photo Uploaded By: Elpidio Eric on 09/17/2015 15:04:27 Wound Measurements Length: (cm) 0 % Reduction Width: (cm) 0 % Reduction Depth: (cm) 0 Epithelializ Area: (cm) 0 Tunneling: Volume: (cm) 0 Undermining in Area: 100% in Volume: 100% ation: Large (67-100%) No : No Wound Description Classification:  Partial Thickness Wound Margin: Flat and Intact Exudate Amount: None Present Foul Odor After Cleansing: No Wound Bed Granulation Amount: Large (67-100%) Exposed Structure Necrotic Amount: None  Present (0%) Fascia Exposed: No Fat Layer Exposed: No Tendon Exposed: No Muscle Exposed: No Joint Exposed: No MAGDALYN, ARENIVAS ([id number]) Bone Exposed: No Limited to Skin Breakdown Periwound Skin Texture Texture Color No Abnormalities Noted: No No Abnormalities Noted: No Callus: No Atrophie Blanche: No Crepitus: No Cyanosis: No Excoriation: No Ecchymosis: No Fluctuance: No Erythema: No Friable: No Hemosiderin Staining: No Induration: No Mottled: No Localized Edema: No Pallor: No Rash: No Rubor: No Scarring: No Temperature / Pain Moisture Temperature: No Abnormality No Abnormalities Noted: No Dry / Scaly: Yes Maceration: No Moist: No Wound Preparation Ulcer Cleansing: Rinsed/Irrigated with Saline Topical Anesthetic Applied: None Electronic Signature(s) Signed: 09/17/2015 10:28:54 AM By: Elpidio Eric BSN, RN Entered By: Elpidio Eric on 09/17/2015 10:28:54 [name] ([id number]) -------------------------------------------------------------------------------- Vitals Details Patient Name: [name] Date of Service: 09/17/2015 10:15 AM Medical Record Number: [id number] Patient Account Number: [id number] Date of Birth/Sex: Mar 28, 1930 (81 y.o. Female) Treating RN: Afful, RN, BSN, Rita Primary Care Physician: Aram Beecham Other Clinician: Referring Physician: Aram Beecham Treating Physician/Extender: Elayne Snare in Treatment: 1 Vital Signs Time Taken: 10:18 Temperature (F): 97.8 Height (in): 61 Pulse (bpm): 59 Weight (lbs): 128 Respiratory Rate (breaths/min): 16 Body Mass Index (BMI): 24.2 Blood Pressure (mmHg): 132/48 Reference Range: 80 - 120 mg / dl Electronic Signature(s) Signed: 09/17/2015 10:18:42 AM By: Elpidio Eric BSN, RN Entered By: Elpidio Eric on 09/17/2015 10:18:41

## 2015-09-17 NOTE — Progress Notes (Addendum)
[name], [name] ([id number]) Visit Report for Barnett/05/2015 Chief Complaint Document Details Patient Name: [name], [name] Barnett/05/2015 Barnett:15 Date of Service: AM Medical Record [id number] Number: Patient Account Number: [id number] [name] (81 y.o. Treating RN: Clover Mealy, RN, BSN, [place] Sink Date of Birth/Sex: Female) Other Clinician: Primary Care Physician: Audie Clear, Zenobia Kuennen Referring Physician: Aram Beecham Physician/Extender: Weeks in Treatment: 1 Information Obtained from: Patient Chief Complaint Patient presents to the wound care center with burn wound too her left wrist which she sustained about a week ago. Electronic Signature(s) Signed: 09/17/2015 5:11:41 PM By: Ardath Sax MD Entered By: Ardath Sax on Barnett/05/2015 12:30:29 [name] ([id number]) -------------------------------------------------------------------------------- HPI Details Patient Name: [name], [name] Barnett/05/2015 Barnett:15 Date of Service: AM Medical Record [id number] Number: Patient Account Number: [id number] [name] (81 y.o. Treating RN: Clover Mealy, RN, BSN, [place] Sink Date of Birth/Sex: Female) Other Clinician: Primary Care Physician: Audie Clear, Nasha Diss Referring Physician: Aram Beecham Physician/Extender: Weeks in Treatment: 1 History of Present Illness Location: left medial wrist Quality: Patient reports experiencing an aching pain to affected area(s). Severity: Patient states wound (s) are getting better. Duration: Patient has had the wound for < 2 weeks prior to presenting for treatment Timing: Pain in wound is Intermittent (comes and goes Context: The wound occurred when the patient burned herself with a hot dish coming out of the abdomen Modifying Factors: Other treatment(s) tried include: has been applying some Neosporin ointment over this Associated Signs and Symptoms: Patient reports having: no bleeding or discharge from the wound. HPI Description:  81 year old patient who is looking much younger than his stated age comes in with a history of having a burn to her medial left wrist which she sustained about a week ago. She has several medical comorbidities including degenerative arthritis, scoliosis, history of back surgery, pacemaker placement,AMA positive, ulnar neuropathy and left carpal tunnel syndrome. she is also had sclerotherapy for varicose veins in May 2003. her medications include some prednisone at the present time which she may be coming off soon. Electronic Signature(s) Signed: 09/17/2015 5:11:41 PM By: Ardath Sax MD Entered By: Ardath Sax on Barnett/05/2015 12:30:36 [name] ([id number]) -------------------------------------------------------------------------------- Physical Exam Details Patient Name: [name], [name] Barnett/05/2015 Barnett:15 Date of Service: AM Medical Record [id number] Number: Patient Account Number: [id number] [name] (81 y.o. Treating RN: Clover Mealy, RN, BSN, [place] Sink Date of Birth/Sex: Female) Other Clinician: Primary Care Physician: Audie Clear, Sarh Kirschenbaum Referring Physician: Aram Beecham Physician/Extender: Tania Ade in Treatment: 1 Electronic Signature(s) Signed: 09/17/2015 5:11:41 PM By: Ardath Sax MD Entered By: Ardath Sax on Barnett/05/2015 12:30:44 [name] ([id number]) -------------------------------------------------------------------------------- Physician Orders Details Patient Name: [name], [name] Barnett/05/2015 Barnett:15 Date of Service: AM Medical Record [id number] Number: Patient Account Number: [id number] March 20, [name] (81 y.o. Treating RN: Clover Mealy, RN, BSN, [place] Sink Date of Birth/Sex: Female) Other Clinician: Primary Care Physician: Audie Clear, [place]cl[place] Mailloux Referring Physician: Aram Beecham Physician/Extender: Tania Ade in Treatment: 1 Verbal / Phone Orders: Yes Clinician: Afful, RN, BSN, Rita Read Back and Verified: Yes Diagnosis Coding Discharge From  [facility] Services o Discharge from Wound Care Center - Treatment Completed Electronic Signature(s) Signed: 09/22/2015 9:09:19 AM By: Ardath Sax MD Previous Signature: Barnett/05/2015 Barnett:29:24 AM Version By: Elpidio Eric BSN, RN Entered By: Ardath Sax on Barnett/10/2015 09:09:19 [name] ([id number]) -------------------------------------------------------------------------------- Problem List Details Patient Name: [name], [name] Barnett/05/2015 Barnett:15 Date of Service: AM Medical Record [id number] Number: Patient Account Number: [id number] [name] (81 y.o. Treating RN: Clover Mealy, RN, BSN, [place] Sink Date of Birth/Sex: Female) Other Clinician: Primary  Care Physician: Aram Beecham Treating Ardath Sax Referring Physician: Aram Beecham Physician/Extender: Tania Ade in Treatment: 1 Active Problems ICD-Barnett Encounter Code Description Active Date Diagnosis T23.272A Burn of second degree of left wrist, initial encounter 09/10/2015 Yes Inactive Problems Resolved Problems Electronic Signature(s) Signed: 09/17/2015 5:11:41 PM By: Ardath Sax MD Entered By: Ardath Sax on Barnett/05/2015 12:30:16 [name] ([id number]) -------------------------------------------------------------------------------- Progress Note Details Patient Name: [name], [name] Barnett/05/2015 Barnett:15 Date of Service: AM Medical Record [id number] Number: Patient Account Number: [id number] March [birth year], [name] (81 y.o. Treating RN: Clover Mealy, RN, BSN, [place] Sink Date of Birth/Sex: Female) Other Clinician: Primary Care Physician: Audie Clear, Breckon Reeves Referring Physician: Aram Beecham Physician/Extender: Weeks in Treatment: 1 Subjective Chief Complaint Information obtained from Patient Patient presents to the wound care center with burn wound too her left wrist which she sustained about a week ago. History of Present Illness (HPI) The following HPI elements were documented for the patient's wound: Location: left medial  wrist Quality: Patient reports experiencing an aching pain to affected area(s). Severity: Patient states wound (s) are getting better. Duration: Patient has had the wound for < 2 weeks prior to presenting for treatment Timing: Pain in wound is Intermittent (comes and goes Context: The wound occurred when the patient burned herself with a hot dish coming out of the abdomen Modifying Factors: Other treatment(s) tried include: has been applying some Neosporin ointment over this Associated Signs and Symptoms: Patient reports having: no bleeding or discharge from the wound. 81 year old patient who is looking much younger than his stated age comes in with a history of having a burn to her medial left wrist which she sustained about a week ago. She has several medical comorbidities including degenerative arthritis, scoliosis, history of back surgery, pacemaker placement,AMA positive, ulnar neuropathy and left carpal tunnel syndrome. she is also had sclerotherapy for varicose veins in May 2003. her medications include some prednisone at the present time which she may be coming off soon. Objective Constitutional Vitals Time Taken: Barnett:18 AM, Height: 61 in, Weight: 128 lbs, BMI: 24.2, Temperature: 97.8 F, Pulse: 59 bpm, Respiratory Rate: 16 breaths/min, Blood Pressure: 132/48 mmHg. Integumentary (Hair, Skin) [name], [name] ([id number]) Wound #2 status is Healed - Epithelialized. Original cause of wound was Shear/Friction. The wound is located on the Left Wrist. The wound measures 0cm length x 0cm width x 0cm depth; 0cm^2 area and 0cm^3 volume. The wound is limited to skin breakdown. There is no tunneling or undermining noted. There is a none present amount of drainage noted. The wound margin is flat and intact. There is large (67-100%) granulation within the wound bed. There is no necrotic tissue within the wound bed. The periwound skin appearance exhibited: Dry/Scaly. The periwound skin appearance  did not exhibit: Callus, Crepitus, Excoriation, Fluctuance, Friable, Induration, Localized Edema, Rash, Scarring, Maceration, Moist, Atrophie Blanche, Cyanosis, Ecchymosis, Hemosiderin Staining, Mottled, Pallor, Rubor, Erythema. Periwound temperature was noted as No Abnormality. Assessment Active Problems ICD-Barnett T23.272A - Burn of second degree of left wrist, initial encounter Procedures Wound healed Plan Discharge From [facility] Services: Discharge from Wound Care Center - Treatment Completed Follow-Up Appointments: A Patient Clinical Summary of Care was provided to [facility] Wound healed return prn [name], [name] ([id number]) Electronic Signature(s) Signed: 09/17/2015 5:11:41 PM By: Ardath Sax MD Entered By: Ardath Sax on Barnett/05/2015 12:31:17 [name] ([id number]) -------------------------------------------------------------------------------- SuperBill Details Patient Name: [name] Date of Service: Barnett/05/2015 Medical Record Patient Account Number: [id number] Number: Afful, RN, BSN, Treating RN: [name], [name] (81 y.o. [place] Sink  Date of Birth/Sex: Female) Other Clinician: Primary Care Physician: Audie Clear, [place] Quezada Referring Physician: Aram Beecham Physician/Extender: Weeks in Treatment: 1 Diagnosis Coding ICD-Barnett Codes Code Description T23.272A Burn of second degree of left wrist, initial encounter Facility Procedures CPT4 Code: [id number] Description: [phone] - WOUND CARE VISIT-LEV 2 EST PT Modifier: Quantity: 1 Physician Procedures CPT4 Code: [id number] Description: 99211 - WC PHYS LEVEL 1 EST PT ICD-Barnett Description Diagnosis T23.272A Burn of second degree of left wrist, initial enc Modifier: ounter Quantity: 1 Electronic Signature(s) Signed: 09/17/2015 5:11:41 PM By: Ardath Sax MD Previous Signature: Barnett/05/2015 Barnett:31:31 AM Version By: Elpidio Eric BSN, RN Entered By: Ardath Sax on Barnett/05/2015 12:31:31

## 2015-09-17 NOTE — Progress Notes (Signed)
seeiheal 

## 2015-11-13 ENCOUNTER — Other Ambulatory Visit: Payer: Self-pay | Admitting: Internal Medicine

## 2015-11-13 DIAGNOSIS — R102 Pelvic and perineal pain: Secondary | ICD-10-CM

## 2015-11-13 DIAGNOSIS — R109 Unspecified abdominal pain: Principal | ICD-10-CM

## 2015-11-19 ENCOUNTER — Ambulatory Visit: Admission: RE | Admit: 2015-11-19 | Payer: Medicare Other | Source: Ambulatory Visit

## 2015-11-26 ENCOUNTER — Other Ambulatory Visit: Payer: Self-pay | Admitting: Internal Medicine

## 2015-11-26 ENCOUNTER — Ambulatory Visit
Admission: RE | Admit: 2015-11-26 | Discharge: 2015-11-26 | Disposition: A | Discharge status: Home / Self Care | Payer: Medicare Other | Source: Ambulatory Visit | Attending: Internal Medicine | Admitting: Internal Medicine

## 2015-11-26 DIAGNOSIS — Z1231 Encounter for screening mammogram for malignant neoplasm of breast: Secondary | ICD-10-CM | POA: Diagnosis not present

## 2015-11-26 DIAGNOSIS — J309 Allergic rhinitis, unspecified: Secondary | ICD-10-CM | POA: Insufficient documentation

## 2015-11-26 DIAGNOSIS — K219 Gastro-esophageal reflux disease without esophagitis: Secondary | ICD-10-CM | POA: Diagnosis present

## 2015-11-27 ENCOUNTER — Ambulatory Visit
Admission: RE | Admit: 2015-11-27 | Discharge: 2015-11-27 | Disposition: A | Discharge status: Home / Self Care | Payer: Medicare Other | Source: Ambulatory Visit | Attending: Internal Medicine | Admitting: Internal Medicine

## 2015-11-27 DIAGNOSIS — R102 Pelvic and perineal pain: Secondary | ICD-10-CM

## 2015-11-27 DIAGNOSIS — R109 Unspecified abdominal pain: Secondary | ICD-10-CM

## 2015-11-27 DIAGNOSIS — K8020 Calculus of gallbladder without cholecystitis without obstruction: Secondary | ICD-10-CM | POA: Insufficient documentation

## 2015-11-27 DIAGNOSIS — R1030 Lower abdominal pain, unspecified: Secondary | ICD-10-CM | POA: Insufficient documentation

## 2015-11-27 HISTORY — DX: Essential (primary) hypertension: I10

## 2015-11-27 MED ORDER — IOHEXOL 300 MG/ML  SOLN
100.0000 mL | Freq: Once | INTRAMUSCULAR | Status: AC | PRN
  Administered 2015-11-27: 75 mL via INTRAVENOUS

## 2016-07-04 ENCOUNTER — Encounter: Payer: Medicare Other | Attending: Surgery | Admitting: Surgery

## 2016-07-04 DIAGNOSIS — S81812A Laceration without foreign body, left lower leg, initial encounter: Secondary | ICD-10-CM | POA: Diagnosis not present

## 2016-07-04 DIAGNOSIS — G629 Polyneuropathy, unspecified: Secondary | ICD-10-CM | POA: Diagnosis not present

## 2016-07-04 DIAGNOSIS — I10 Essential (primary) hypertension: Secondary | ICD-10-CM | POA: Diagnosis not present

## 2016-07-04 DIAGNOSIS — M1990 Unspecified osteoarthritis, unspecified site: Secondary | ICD-10-CM | POA: Diagnosis not present

## 2016-07-04 DIAGNOSIS — M419 Scoliosis, unspecified: Secondary | ICD-10-CM | POA: Diagnosis not present

## 2016-07-04 DIAGNOSIS — M109 Gout, unspecified: Secondary | ICD-10-CM | POA: Diagnosis not present

## 2016-07-04 DIAGNOSIS — X58XXXA Exposure to other specified factors, initial encounter: Secondary | ICD-10-CM | POA: Insufficient documentation

## 2016-07-04 DIAGNOSIS — Z950 Presence of cardiac pacemaker: Secondary | ICD-10-CM | POA: Insufficient documentation

## 2016-07-04 DIAGNOSIS — Z880 Allergy status to penicillin: Secondary | ICD-10-CM | POA: Insufficient documentation

## 2016-07-04 DIAGNOSIS — L97222 Non-pressure chronic ulcer of left calf with fat layer exposed: Secondary | ICD-10-CM | POA: Diagnosis not present

## 2016-07-04 NOTE — Progress Notes (Signed)
[name], [name] ([id number]) Visit Report for 07/04/2016 Allergy List Details Patient Name: [name], MONTS. Date of Service: 07/04/2016 8:45 AM Medical Record Number: [id number] Patient Account Number: [id number] Date of Birth/Sex: 29-Aug-1930 (82 y.o. Female) Treating RN: Huel Coventry Primary Care Physician: Aram Beecham Other Clinician: Referring Physician: Aram Beecham Treating Physician/Extender: Rudene Re in Treatment: 0 Allergies Active Allergies Penicillins Reaction: rash Severity: Moderate Sporanox Reaction: elevated LFT Severity: Moderate adhesive Reaction: skin alergy Severity: Moderate codeine Reaction: n/v Severity: Moderate Cipro Reaction: diarrhea Severity: Moderate Allergy Notes Electronic Signature(s) Signed: 07/04/2016 3:03:12 PM By: Elliot Gurney, RN, BSN, Kim RN, BSN Entered By: Elliot Gurney, RN, BSN, Kim on 07/04/2016 09:03:41 [name] ([id number]) -------------------------------------------------------------------------------- Arrival Information Details Patient Name: [name] Date of Service: 07/04/2016 8:45 AM Medical Record Number: [id number] Patient Account Number: [id number] Date of Birth/Sex: 09-02-1930 (82 y.o. Female) Treating RN: Huel Coventry Primary Care Physician: Aram Beecham Other Clinician: Referring Physician: Aram Beecham Treating Physician/Extender: Rudene Re in Treatment: 0 Visit Information Patient Arrived: Ambulatory Arrival Time: 08:54 Accompanied By: self Transfer Assistance: None Patient Identification Verified: Yes Secondary Verification Process Yes Completed: Patient Requires Transmission-Based No Precautions: Patient Has Alerts: No History Since Last Visit Added or deleted any medications: No Any new allergies or adverse reactions: No Had a fall or experienced change in activities of daily living that may affect risk of falls: No Signs or symptoms of abuse/neglect since last visito  No Electronic Signature(s) Signed: 07/04/2016 3:03:12 PM By: Elliot Gurney, RN, BSN, Kim RN, BSN Entered By: Elliot Gurney, RN, BSN, Kim on 07/04/2016 08:55:05 [name] ([id number]) -------------------------------------------------------------------------------- Clinic Level of Care Assessment Details Patient Name: [name] Date of Service: 07/04/2016 8:45 AM Medical Record Number: [id number] Patient Account Number: [id number] Date of Birth/Sex: June 25, 1930 (82 y.o. Female) Treating RN: Huel Coventry Primary Care Physician: Aram Beecham Other Clinician: Referring Physician: Aram Beecham Treating Physician/Extender: Rudene Re in Treatment: 0 Clinic Level of Care Assessment Items TOOL 2 Quantity Score []  - Use when only an EandM is performed on the INITIAL visit 0 ASSESSMENTS - Nursing Assessment / Reassessment X - General Physical Exam (combine w/ comprehensive assessment (listed just 1 20 below) when performed on new pt. evals) X - Comprehensive Assessment (HX, ROS, Risk Assessments, Wounds Hx, etc.) 1 25 ASSESSMENTS - Wound and Skin Assessment / Reassessment X - Simple Wound Assessment / Reassessment - one wound 1 5 []  - Complex Wound Assessment / Reassessment - multiple wounds 0 []  - Dermatologic / Skin Assessment (not related to wound area) 0 ASSESSMENTS - Ostomy and/or Continence Assessment and Care []  - Incontinence Assessment and Management 0 []  - Ostomy Care Assessment and Management (repouching, etc.) 0 PROCESS - Coordination of Care X - Simple Patient / Family Education for ongoing care 1 15 []  - Complex (extensive) Patient / Family Education for ongoing care 0 X - Staff obtains Chiropractor, Records, Test Results / Process Orders 1 10 []  - Staff telephones HHA, Nursing Homes / Clarify orders / etc 0 []  - Routine Transfer to another Facility (non-emergent condition) 0 []  - Routine Hospital Admission (non-emergent condition) 0 X - New Admissions / Ambulance person / Ordering NPWT, Apligraf, etc. 1 15 []  - Emergency Hospital Admission (emergent condition) 0 X - Simple Discharge Coordination 1 10 [name], [name] ([id number]) []  - Complex (extensive) Discharge Coordination 0 PROCESS - Special Needs []  - Pediatric / Minor Patient Management 0 []  - Isolation Patient Management 0 []  - Hearing / Language / Visual  special needs 0 []  - Assessment of Community assistance (transportation, D/C planning, etc.) 0 []  - Additional assistance / Altered mentation 0 []  - Support Surface(s) Assessment (bed, cushion, seat, etc.) 0 INTERVENTIONS - Wound Cleansing / Measurement X - Wound Imaging (photographs - any number of wounds) 1 5 []  - Wound Tracing (instead of photographs) 0 X - Simple Wound Measurement - one wound 1 5 []  - Complex Wound Measurement - multiple wounds 0 X - Simple Wound Cleansing - one wound 1 5 []  - Complex Wound Cleansing - multiple wounds 0 INTERVENTIONS - Wound Dressings []  - Small Wound Dressing one or multiple wounds 0 X - Medium Wound Dressing one or multiple wounds 1 15 []  - Large Wound Dressing one or multiple wounds 0 []  - Application of Medications - injection 0 INTERVENTIONS - Miscellaneous []  - External ear exam 0 []  - Specimen Collection (cultures, biopsies, blood, body fluids, etc.) 0 []  - Specimen(s) / Culture(s) sent or taken to Lab for analysis 0 []  - Patient Transfer (multiple staff / / Similar devices) 0 []  - Simple Staple / Suture removal (25 or less) 0 []  - Complex Staple / Suture removal (26 or more) 0 [name], [name] ( ) []  - Hypo / Hyperglycemic Management (close monitor of Blood Glucose) 0 X - Ankle / Brachial Index (ABI) - do not check if billed separately 1 15 Has the patient been seen at the hospital within the last three years: Yes Total Score: 145 Level Of Care: New/Established - Level 4 Electronic Signature(s) Signed: 07/04/2016 3:03:12 PM By: , RN, BSN, Kim RN,  BSN Entered By: , RN, BSN, Kim on 07/04/2016 09:41:45 ( ) -------------------------------------------------------------------------------- Encounter Discharge Information Details Patient Name: Date of Service: 07/04/2016 8:45 AM Medical Record Number: Patient Account Number: Date of Birth/Sex: December 14, 1929 (82 y.o. Female) Treating RN: Primary Care Physician: Candiss Norse Other Clinician: Referring Physician: [id number] Treating Physician/Extender: in Treatment: 0 Encounter Discharge Information Items Discharge Pain Level: 0 Discharge Condition: Stable Ambulatory Status: Ambulatory Discharge Destination: Home Transportation: Other Accompanied By: self Schedule Follow-up Appointment: Yes Medication Reconciliation completed and provided to Patient/Care Yes Aimar Borghi: Provided on Clinical Summary of Care: 07/04/2016 Form Type Recipient Paper Patient [facility] Notes patient sis not bring cane to visit. RN escorted patient out to her car. Electronic Signature(s) Signed: 07/04/2016 3:03:12 PM By: 07/06/2016 RN, BSN, Kim RN, BSN Previous Signature: 07/04/2016 9:31:02 AM Version By: [id number] Entered By: Natasha Basset RN, BSN, Kim on 07/04/2016 09:44:21 [id number] ([id number]) -------------------------------------------------------------------------------- Lower Extremity Assessment Details Patient Name: [name], [name] Date of Service: 07/04/2016 8:45 AM Medical Record Number: Huel Coventry Patient Account Number: Aram Beecham Date of Birth/Sex: [birth year] (82 y.o. Female) Treating RN: 07/06/2016 Primary Care Physician: [facility] Other Clinician: Referring Physician: 07/06/2016 Treating Physician/Extender: Elliot Gurney in Treatment: 0 Edema Assessment Assessed: 07/06/2016: No] [Right: No] E[Left: dema] [Right: :] Calf Left: Right: Point of Measurement: 34 cm From Medial  Instep 34 cm cm Ankle Left: Right: Point of Measurement: 10 cm From Medial Instep 21 cm cm Vascular Assessment Pulses: Posterior Tibial Dorsalis Pedis Palpable: [Left:Yes] Extremity colors, hair growth, and conditions: Extremity Color: [Left:Hyperpigmented] Hair Growth on Extremity: [Left:No] Temperature of Extremity: [Left:Warm] Capillary Refill: [Left:< 3 seconds] Blood Pressure: Brachial: [Left:120] Dorsalis Pedis: 140 [Left:Dorsalis Pedis:] Ankle: Posterior Tibial: [Left:Posterior Tibial: 1.17] Toe Nail Assessment Left: Right: Thick: No Discolored: No Deformed: No Improper Length and Hygiene: No Electronic  Signature(s) Signed: 07/04/2016 3:03:12 PM By: Elliot Gurney RN, BSN, Kim RN, BSN [address], [place] ([id number]) Entered By: Elliot Gurney, RN, BSN, Kim on 07/04/2016 09:06:56 [name] ([id number]) -------------------------------------------------------------------------------- Multi Wound Chart Details Patient Name: [name], [name] Date of Service: 07/04/2016 8:45 AM Medical Record Number: [id number] Patient Account Number: [id number] Date of Birth/Sex: 1930/10/22 (82 y.o. Female) Treating RN: Huel Coventry Primary Care Physician: Aram Beecham Other Clinician: Referring Physician: Aram Beecham Treating Physician/Extender: Rudene Re in Treatment: 0 Vital Signs Height(in): 60 Pulse(bpm): 66 Weight(lbs): 126 Blood Pressure 131/49 (mmHg): Body Mass Index(BMI): 25 Temperature(F): 98.0 Respiratory Rate 18 (breaths/min): Photos: [N/A:N/A] Wound Location: Left Lower Leg - Medial N/A N/A Wounding Event: Trauma N/A N/A Primary Etiology: Trauma, Other N/A N/A Date Acquired: 06/27/2016 N/A N/A Weeks of Treatment: 0 N/A N/A Wound Status: Open N/A N/A Measurements L x W x D 6x8x0.1 N/A N/A (cm) Area (cm) : 37.699 N/A N/A Volume (cm) : 3.77 N/A N/A Classification: Partial Thickness N/A N/A Exudate Amount: Medium N/A N/A Exudate Type: Serous N/A  N/A Exudate Color: amber N/A N/A Wound Margin: Flat and Intact N/A N/A Granulation Amount: None Present (0%) N/A N/A Necrotic Amount: None Present (0%) N/A N/A Exposed Structures: Fascia: No N/A N/A Fat: No Tendon: No Muscle: No Joint: No Bone: No KEIONNA, KINNAIRD V. ([id number]) Limited to Skin Breakdown Epithelialization: None N/A N/A Periwound Skin Texture: Edema: No N/A N/A Excoriation: No Induration: No Callus: No Crepitus: No Fluctuance: No Friable: No Rash: No Scarring: No Periwound Skin Moist: Yes N/A N/A Moisture: Maceration: No Dry/Scaly: No Periwound Skin Color: Ecchymosis: Yes N/A N/A Atrophie Blanche: No Cyanosis: No Erythema: No Hemosiderin Staining: No Mottled: No Pallor: No Rubor: No Temperature: No Abnormality N/A N/A Tenderness on Yes N/A N/A Palpation: Wound Preparation: Ulcer Cleansing: N/A N/A Rinsed/Irrigated with Saline Topical Anesthetic Applied: Other: lidociane 4% Treatment Notes Electronic Signature(s) Signed: 07/04/2016 3:03:12 PM By: Elliot Gurney, RN, BSN, Kim RN, BSN Entered By: Elliot Gurney, RN, BSN, Kim on 07/04/2016 09:11:02 [name] ([id number]) -------------------------------------------------------------------------------- Multi-Disciplinary Care Plan Details Patient Name: [name], [name] Date of Service: 07/04/2016 8:45 AM Medical Record Number: [id number] Patient Account Number: [id number] Date of Birth/Sex: 1930-10-28 (82 y.o. Female) Treating RN: Huel Coventry Primary Care Physician: Aram Beecham Other Clinician: Referring Physician: Aram Beecham Treating Physician/Extender: Rudene Re in Treatment: 0 Active Inactive Abuse / Safety / Falls / Self Care Management Nursing Diagnoses: Potential for falls Goals: Patient will remain injury free Date Initiated: 07/04/2016 Goal Status: Active Interventions: Assess fall risk on admission and as needed Notes: Orientation to the Wound Care Program Nursing  Diagnoses: Knowledge deficit related to the wound healing center program Goals: Patient/caregiver will verbalize understanding of the Wound Healing Center Program Date Initiated: 07/04/2016 Goal Status: Active Interventions: Provide education on orientation to the wound center Notes: Soft Tissue Infection Nursing Diagnoses: Impaired tissue integrity Potential for infection: soft tissue Goals: Patient will remain free of wound infection [name], [name] ([id number]) Date Initiated: 07/04/2016 Goal Status: Active Interventions: Assess signs and symptoms of infection every visit Notes: Wound/Skin Impairment Nursing Diagnoses: Impaired tissue integrity Goals: Ulcer/skin breakdown will have a volume reduction of 30% by week 4 Date Initiated: 07/04/2016 Goal Status: Active Interventions: Assess ulceration(s) every visit Notes: Electronic Signature(s) Signed: 07/04/2016 3:03:12 PM By: Elliot Gurney, RN, BSN, Kim RN, BSN Entered By: Elliot Gurney, RN, BSN, Kim on 07/04/2016 09:10:36 [name] ([id number]) -------------------------------------------------------------------------------- Pain Assessment Details Patient Name: [name] Date of Service: 07/04/2016 8:45 AM Medical Record Number:  [id number] Patient Account Number: [id number] Date of Birth/Sex: [birth year] (82 y.o. Female) Treating RN: Huel Coventry Primary Care Physician: Aram Beecham Other Clinician: Referring Physician: Aram Beecham Treating Physician/Extender: Rudene Re in Treatment: 0 Active Problems Location of Pain Severity and Description of Pain Patient Has Paino No Site Locations With Dressing Change: No Pain Management and Medication Current Pain Management: Electronic Signature(s) Signed: 07/04/2016 3:03:12 PM By: Elliot Gurney, RN, BSN, Kim RN, BSN Entered By: Elliot Gurney, RN, BSN, Kim on 07/04/2016 08:55:12 [name]  ([id number]) -------------------------------------------------------------------------------- Patient/Caregiver Education Details Patient Name: [name] Date of Service: 07/04/2016 8:45 AM Medical Record Number: [id number] Patient Account Number: [id number] Date of Birth/Gender: Jan 02, 1930 (82 y.o. Female) Treating RN: Huel Coventry Primary Care Physician: Aram Beecham Other Clinician: Referring Physician: Aram Beecham Treating Physician/Extender: Rudene Re in Treatment: 0 Education Assessment Education Provided To: Patient Education Topics Provided Wound/Skin Impairment: Handouts: Caring for Your Ulcer, Other: do not get dressing wet Methods: Demonstration, Explain/Verbal Responses: State content correctly Electronic Signature(s) Signed: 07/04/2016 3:03:12 PM By: Elliot Gurney, RN, BSN, Kim RN, BSN Entered By: Elliot Gurney, RN, BSN, Kim on 07/04/2016 09:44:54 [name] ([id number]) -------------------------------------------------------------------------------- Wound Assessment Details Patient Name: [name] Date of Service: 07/04/2016 8:45 AM Medical Record Number: [id number] Patient Account Number: [id number] Date of Birth/Sex: 25-Jun-1930 (82 y.o. Female) Treating RN: Huel Coventry Primary Care Physician: Aram Beecham Other Clinician: Referring Physician: Aram Beecham Treating Physician/Extender: Rudene Re in Treatment: 0 Wound Status Wound Number: 3 Primary Etiology: Trauma, Other Wound Location: Left Lower Leg - Medial Wound Status: Open Wounding Event: Trauma Date Acquired: 06/27/2016 Weeks Of Treatment: 0 Clustered Wound: No Photos Wound Measurements Length: (cm) 6 Width: (cm) 8 Depth: (cm) 0.1 Area: (cm) 37.699 Volume: (cm) 3.77 % Reduction in Area: % Reduction in Volume: Epithelialization: None Tunneling: No Undermining: No Wound Description Classification: Partial Thickness Wound Margin: Flat and Intact Exudate  Amount: Medium Exudate Type: Serous Exudate Color: amber Foul Odor After Cleansing: No Wound Bed Granulation Amount: None Present (0%) Exposed Structure Necrotic Amount: None Present (0%) Fascia Exposed: No Fat Layer Exposed: No Tendon Exposed: No Muscle Exposed: No [name], [name] ([id number]) Joint Exposed: No Bone Exposed: No Limited to Skin Breakdown Periwound Skin Texture Texture Color No Abnormalities Noted: No No Abnormalities Noted: No Callus: No Atrophie Blanche: No Crepitus: No Cyanosis: No Excoriation: No Ecchymosis: Yes Fluctuance: No Erythema: No Friable: No Hemosiderin Staining: No Induration: No Mottled: No Localized Edema: No Pallor: No Rash: No Rubor: No Scarring: No Temperature / Pain Moisture Temperature: No Abnormality No Abnormalities Noted: No Tenderness on Palpation: Yes Dry / Scaly: No Maceration: No Moist: Yes Wound Preparation Ulcer Cleansing: Rinsed/Irrigated with Saline Topical Anesthetic Applied: Other: lidociane 4%, Electronic Signature(s) Signed: 07/04/2016 3:03:12 PM By: Elliot Gurney, RN, BSN, Kim RN, BSN Entered By: Elliot Gurney, RN, BSN, Kim on 07/04/2016 09:03:07 [name] ([id number]) -------------------------------------------------------------------------------- Vitals Details Patient Name: [name] Date of Service: 07/04/2016 8:45 AM Medical Record Number: [id number] Patient Account Number: [id number] Date of Birth/Sex: 15-Oct-1930 (82 y.o. Female) Treating RN: Huel Coventry Primary Care Physician: Aram Beecham Other Clinician: Referring Physician: Aram Beecham Treating Physician/Extender: Rudene Re in Treatment: 0 Vital Signs Time Taken: 08:55 Temperature (F): 98.0 Height (in): 60 Pulse (bpm): 66 Source: Stated Respiratory Rate (breaths/min): 18 Weight (lbs): 126 Blood Pressure (mmHg): 131/49 Source: Stated Reference Range: 80 - 120 mg / dl Body Mass Index (BMI): 24.6 Electronic  Signature(s) Signed: 07/04/2016 3:03:12 PM By: Elliot Gurney, RN, BSN, Kim RN, BSN  Entered By: Elliot Gurney, RN, BSN, Kim on 07/04/2016 08:56:01

## 2016-07-04 NOTE — Progress Notes (Signed)
[name], [name] ([id number]) Visit Report for 07/04/2016 Abuse/Suicide Risk Screen Details Patient Name: [name], [name] Date of Service: 07/04/2016 8:45 AM Medical Record Number: [id number] Patient Account Number: [id number] Date of Birth/Sex: 01/20/1930 (82 y.o. Female) Treating RN: Huel Coventry Primary Care Physician: Aram Beecham Other Clinician: Referring Physician: Aram Beecham Treating Physician/Extender: Rudene Re in Treatment: 0 Abuse/Suicide Risk Screen Items Answer ABUSE/SUICIDE RISK SCREEN: Has anyone close to you tried to hurt or harm you recentlyo No Do you feel uncomfortable with anyone in your familyo No Has anyone forced you do things that you didnot want to doo No Do you have any thoughts of harming yourselfo No Patient displays signs or symptoms of abuse and/or neglect. No Electronic Signature(s) Signed: 07/04/2016 3:03:12 PM By: Elliot Gurney, RN, BSN, Kim RN, BSN Entered By: Elliot Gurney, RN, BSN, Kim on 07/04/2016 09:07:13 [name] ([id number]) -------------------------------------------------------------------------------- Activities of Daily Living Details Patient Name: [name], [name] Date of Service: 07/04/2016 8:45 AM Medical Record Number: [id number] Patient Account Number: [id number] Date of Birth/Sex: 12/22/1929 (82 y.o. Female) Treating RN: Huel Coventry Primary Care Physician: Aram Beecham Other Clinician: Referring Physician: Aram Beecham Treating Physician/Extender: Rudene Re in Treatment: 0 Activities of Daily Living Items Answer Activities of Daily Living (Please select one for each item) Drive Automobile Completely Able Take Medications Completely Able Use Telephone Completely Able Care for Appearance Completely Able Use Toilet Completely Able Bath / Shower Completely Able Dress Self Completely Able Feed Self Completely Able Walk Completely Able Get In / Out Bed Completely Able Housework Completely Able Prepare Meals  Completely Able Handle Money Completely Able Shop for Self Completely Able Electronic Signature(s) Signed: 07/04/2016 3:03:12 PM By: Elliot Gurney, RN, BSN, Kim RN, BSN Entered By: Elliot Gurney, RN, BSN, Kim on 07/04/2016 09:07:25 [name] ([id number]) -------------------------------------------------------------------------------- Education Assessment Details Patient Name: [name] Date of Service: 07/04/2016 8:45 AM Medical Record Number: [id number] Patient Account Number: [id number] Date of Birth/Sex: 07-09-1930 (82 y.o. Female) Treating RN: Huel Coventry Primary Care Physician: Aram Beecham Other Clinician: Referring Physician: Aram Beecham Treating Physician/Extender: Rudene Re in Treatment: 0 Primary Learner Assessed: Patient Learning Preferences/Education Level/Primary Language Learning Preference: Explanation, Demonstration Highest Education Level: College or Above Preferred Language: English Cognitive Barrier Assessment/Beliefs Language Barrier: No Translator Needed: No Memory Deficit: No Emotional Barrier: No Cultural/Religious Beliefs Affecting Medical No Care: Physical Barrier Assessment Impaired Vision: No Impaired Hearing: No Decreased Hand dexterity: No Knowledge/Comprehension Assessment Knowledge Level: High Comprehension Level: High Ability to understand written High instructions: Ability to understand verbal High instructions: Motivation Assessment Anxiety Level: Calm Cooperation: Cooperative Education Importance: Acknowledges Need Interest in Health Problems: Asks Questions Perception: Coherent Willingness to Engage in Self- High Management Activities: Readiness to Engage in Self- High Management Activities: Electronic Signature(s) [name], [name] ([id number]) Signed: 07/04/2016 3:03:12 PM By: Elliot Gurney, RN, BSN, Kim RN, BSN Entered By: Elliot Gurney, RN, BSN, Kim on 07/04/2016 09:08:12 [name]  ([id number]) -------------------------------------------------------------------------------- Fall Risk Assessment Details Patient Name: [name] Date of Service: 07/04/2016 8:45 AM Medical Record Number: [id number] Patient Account Number: [id number] Date of Birth/Sex: 1930/12/07 (81 y.o. Female) Treating RN: Huel Coventry Primary Care Physician: Aram Beecham Other Clinician: Referring Physician: Aram Beecham Treating Physician/Extender: Rudene Re in Treatment: 0 Fall Risk Assessment Items Have you had 2 or more falls in the last 12 monthso 0 Yes Have you had any fall that resulted in injury in the last 12 monthso 0 Yes FALL RISK ASSESSMENT: History of falling - immediate or within  3 months 25 Yes Secondary diagnosis 0 No Ambulatory aid None/bed rest/wheelchair/nurse 0 Yes Crutches/cane/walker 0 No Furniture 0 No IV Access/Saline Lock 0 No Gait/Training Normal/bed rest/immobile 0 No Weak 10 Yes Impaired 0 No Mental Status Oriented to own ability 0 Yes Electronic Signature(s) Signed: 07/04/2016 3:03:12 PM By: Elliot Gurney, RN, BSN, Kim RN, BSN Entered By: Elliot Gurney, RN, BSN, Kim on 07/04/2016 09:08:33 [name] ([id number]) -------------------------------------------------------------------------------- Foot Assessment Details Patient Name: [name] Date of Service: 07/04/2016 8:45 AM Medical Record Number: [id number] Patient Account Number: [id number] Date of Birth/Sex: 1930/11/30 (82 y.o. Female) Treating RN: Huel Coventry Primary Care Physician: Aram Beecham Other Clinician: Referring Physician: Aram Beecham Treating Physician/Extender: Rudene Re in Treatment: 0 Foot Assessment Items Site Locations + = Sensation present, - = Sensation absent, C = Callus, U = Ulcer R = Redness, W = Warmth, M = Maceration, PU = Pre-ulcerative lesion F = Fissure, S = Swelling, D = Dryness Assessment Right: Left: Other Deformity: No No Prior Foot Ulcer:  No No Prior Amputation: No No Charcot Joint: No No Ambulatory Status: Ambulatory Without Help Gait: Steady Electronic Signature(s) Signed: 07/04/2016 3:03:12 PM By: Elliot Gurney, RN, BSN, Kim RN, BSN Entered By: Elliot Gurney, RN, BSN, Kim on 07/04/2016 09:09:29 [name] ([id number]) -------------------------------------------------------------------------------- Nutrition Risk Assessment Details Patient Name: [name] Date of Service: 07/04/2016 8:45 AM Medical Record Number: [id number] Patient Account Number: [id number] Date of Birth/Sex: [birth year] (82 y.o. Female) Treating RN: Huel Coventry Primary Care Physician: Aram Beecham Other Clinician: Referring Physician: Aram Beecham Treating Physician/Extender: Rudene Re in Treatment: 0 Height (in): 60 Weight (lbs): 126 Body Mass Index (BMI): 24.6 Nutrition Risk Assessment Items NUTRITION RISK SCREEN: I have an illness or condition that made me change the kind and/or 0 No amount of food I eat I eat fewer than two meals per day 0 No I eat few fruits and vegetables, or milk products 0 No I have three or more drinks of beer, liquor or wine almost every day 0 No I have tooth or mouth problems that make it hard for me to eat 0 No I don't always have enough money to buy the food I need 0 No I eat alone most of the time 0 No I take three or more different prescribed or over-the-counter drugs a 1 Yes day Without wanting to, I have lost or gained 10 pounds in the last six 0 No months I am not always physically able to shop, cook and/or feed myself 0 No Nutrition Protocols Good Risk Protocol 0 No interventions needed Moderate Risk Protocol Electronic Signature(s) Signed: 07/04/2016 3:03:12 PM By: Elliot Gurney, RN, BSN, Kim RN, BSN Entered By: Elliot Gurney, RN, BSN, Kim on 07/04/2016 09:08:46

## 2016-07-05 NOTE — Progress Notes (Addendum)
[name] ([id number]) Visit Report for 07/04/2016 Chief Complaint Document Details Patient Name: [name], [name]. Date of Service: 07/04/2016 8:45 AM Medical Record Number: [id number] Patient Account Number: [id number] Date of Birth/Sex: 1930-12-03 (82 y.o. Female) Treating Barnett: [name] Primary Care Physician: [name] Other Clinician: Referring Physician: Aram Barnett Treating Physician/Extender: [name] in Treatment: 0 Information Obtained from: Patient Chief Complaint Patient seen for complaints of Non-Healing Wound to the left lower extremity for about a week Electronic Signature(s) Signed: 07/04/2016 9:24:48 AM By: Natasha Kanner MD, FACS Entered By: [name] on 07/04/2016 09:24:48 [name] ([id number]) -------------------------------------------------------------------------------- HPI Details Patient Name: [name] Date of Service: 07/04/2016 8:45 AM Medical Record Number: [id number] Patient Account Number: [id number] Date of Birth/Sex: July 29, 1930 (82 y.o. Female) Treating Barnett: [name] Primary Care Physician: [name] Other Clinician: Referring Physician: Aram Barnett Treating Physician/Extender: [name] in Treatment: 0 History of Present Illness Location: left lower extremity Quality: Patient reports experiencing an aching pain to affected area(s). Severity: Patient states wound (s) are getting better. Duration: Patient has had the wound for < 1 weeks prior to presenting for treatment Timing: Pain in wound is Intermittent (comes and goes) Context: The wound occurred when the patient tripped and injured her left lower extremity where she had some skin tears. Modifying Factors: Other treatment(s) tried include: has been applying some Neosporin ointment over this Associated Signs and Symptoms: Patient reports having: no bleeding or discharge from the wound. HPI Description: 82 year old patient who is  looking much younger than his stated age comes in with a history of having a laceration to her left lower extremity which she sustained about a week ago. She has several medical comorbidities including degenerative arthritis, scoliosis, history of back surgery, pacemaker placement,AMA positive, ulnar neuropathy and left carpal tunnel syndrome. she is also had sclerotherapy for varicose veins in May 2003. her medications include some prednisone at the present time which she may be coming off soon. She went to the [facility] where they have been dressing her wound and she is hear for review. Electronic Signature(s) Signed: 07/04/2016 9:26:38 AM By: Natasha Kanner MD, FACS Entered By: [name] on 07/04/2016 09:26:37 [name] ([id number]) -------------------------------------------------------------------------------- Physical Exam Details Patient Name: [name] Date of Service: 07/04/2016 8:45 AM Medical Record Number: [id number] Patient Account Number: [id number] Date of Birth/Sex: May 05, 1930 (82 y.o. Female) Treating Barnett: [name] Primary Care Physician: [name] Other Clinician: Referring Physician: Aram Barnett Treating Physician/Extender: [name] in Treatment: 0 Constitutional . Pulse regular. Respirations normal and unlabored. Afebrile. . Eyes Nonicteric. Reactive to light. Ears, Nose, Mouth, and Throat Lips, teeth, and gums WNL.Marland Kitchen Moist mucosa without lesions. Neck supple and nontender. No palpable supraclavicular or cervical adenopathy. Normal sized without goiter. Respiratory WNL. No retractions.. Cardiovascular Pedal Pulses WNL. ABI on the left is 1.17. No clubbing, cyanosis or edema. Chest Breast tissue WNL, no masses, lumps, or tenderness.. Gastrointestinal (GI) Abdomen without masses or tenderness.. No liver or spleen enlargement or tenderness.. Lymphatic No adneopathy. No adenopathy. No adenopathy. Musculoskeletal Adexa  without tenderness or enlargement.. Digits and nails w/o clubbing, cyanosis, infection, petechiae, ischemia, or inflammatory conditions.. Integumentary (Hair, Skin) No suspicious lesions. No crepitus or fluctuance. No peri-wound warmth or erythema. No masses.Marland Kitchen Psychiatric Judgement and insight Intact.. No evidence of depression, anxiety, or agitation.. Notes She has 2 skin tears on the left medial calf a little below her knee and both areas have significant amount of ecchymosis around them.  The exposed subcutaneous tissue is a bit dry but there is no cellulitis or inflammation. Electronic Signature(s) Signed: 07/04/2016 9:27:55 AM By: Natasha Kanner MD, FACS Entered By: [name] on 07/04/2016 09:27:54 [name] ([id number]) [name], [name] ([id number]) -------------------------------------------------------------------------------- Physician Orders Details Patient Name: [name] Date of Service: 07/04/2016 8:45 AM Medical Record Number: [id number] Patient Account Number: [id number] Date of Birth/Sex: May 30, 1930 (82 y.o. Female) Treating Barnett: [name] Primary Care Physician: [name] Other Clinician: Referring Physician: Aram Barnett Treating Physician/Extender: [name] in Treatment: 0 Verbal / Phone Orders: Yes Clinician: Huel Barnett Read Back and Verified: Yes Diagnosis Coding Wound Cleansing Wound #3 Left,Medial Lower Leg o Clean wound with Normal Saline. Primary Wound Dressing Wound #3 Left,Medial Lower Leg o Mepitel One o Foam Dressing Change Frequency Wound #3 Left,Medial Lower Leg o Change dressing every week Follow-up Appointments Wound #3 Left,Medial Lower Leg o Return Appointment in 1 week. Edema Control o Other: - Kerlix and Coban from base of toes to 3 cm below knee. Electronic Signature(s) Signed: 07/04/2016 3:03:12 PM By: Natasha Gurney Barnett, BSN, [name], BSN Signed: 07/04/2016 4:20:18 PM By: Natasha Kanner MD,  FACS Entered By: Natasha Gurney Barnett, BSN, Natasha on 07/04/2016 09:21:38 [name] ([id number]) -------------------------------------------------------------------------------- Problem List Details Patient Name: [name], [name] Date of Service: 07/04/2016 8:45 AM Medical Record Number: [id number] Patient Account Number: [id number] Date of Birth/Sex: 1930-01-12 (82 y.o. 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 Female) Treating Barnett: [name] Primary Care Physician: [name] Other Clinician: Referring Physician: Aram Barnett Treating Physician/Extender: [name] in Treatment: 0 Subjective Chief Complaint Information obtained from Patient Patient seen for complaints of Non-Healing Wound to the left lower extremity for about a week History of Present Illness (HPI) The following HPI elements were documented for the patient's wound: Location: left lower extremity Quality: Patient reports experiencing an aching pain to affected area(s). Severity: Patient states wound (s) are getting better. Duration: Patient has  had the wound for < 1 weeks prior to presenting for treatment Timing: Pain in wound is Intermittent (comes and goes) Context: The wound occurred when the patient tripped and injured her left lower extremity where she had some skin tears. Modifying Factors: Other treatment(s) tried include: has been applying some Neosporin ointment over this Associated Signs and Symptoms: Patient reports having: no bleeding or discharge from the wound. 82 year old patient who is looking much younger than his stated age comes in with a history of having a laceration to her left lower extremity which she sustained about a week ago. She has several medical comorbidities including degenerative arthritis, scoliosis, history of back surgery, pacemaker placement,AMA positive, ulnar neuropathy and left carpal tunnel syndrome. she is also had sclerotherapy for varicose veins in May 2003. her medications include some prednisone at the present time which she may be coming off soon. She went to the [facility] where they have been dressing her wound and she is hear for review. Wound History Patient presents with 2 open wounds that have been present for approximately 1 week. Patient has been treating wounds in the following manner: polysporin. Laboratory tests have not been performed in the last month. Patient reportedly has not tested positive for an antibiotic resistant organism. Patient reportedly has not tested positive for osteomyelitis. Patient reportedly has not had testing performed to evaluate circulation in the legs. Patient History Information obtained from Patient. Allergies Penicillins (Severity: Moderate, Reaction: rash), Sporanox (Severity: Moderate, Reaction: elevated LFT), adhesive (Severity: Moderate, Reaction: skin alergy), codeine (Severity: Moderate, Reaction: n/v), Cipro [name], [name] ([id number]) (Severity: Moderate, Reaction: diarrhea) Family History Cancer - Mother, Siblings, Diabetes -  Father, Heart Disease - Father, Siblings, Hypertension - Father, Siblings, Lung Disease - Siblings, No family history of Hereditary Spherocytosis, Kidney Disease, Seizures, Stroke, Thyroid Problems, Tuberculosis. Social History Never smoker, Marital Status - Married, Alcohol Use - Never, Drug Use - No History, Caffeine Use - Never. Medical History Musculoskeletal Denies history of Rheumatoid Arthritis Neurologic Patient has history of Neuropathy - lower legs Medical And Surgical History Notes Constitutional Symptoms (General Health) Back pain Ear/Nose/Mouth/Throat bilateral hearing aides Cardiovascular pacemaker Review of Systems (ROS) Constitutional Symptoms (General Health) The patient has no complaints or symptoms. Eyes The patient has no complaints or symptoms. Ear/Nose/Mouth/Throat The patient has no complaints or symptoms. Hematologic/Lymphatic The patient has no complaints or symptoms. Cardiovascular Complains or has symptoms of Chest pain. Denies complaints or symptoms of LE edema. Gastrointestinal The patient has no complaints or symptoms. Endocrine The patient has no complaints or symptoms. Genitourinary The patient has no complaints or symptoms. Immunological The patient has no complaints or symptoms. Integumentary (Skin) Complains or has symptoms of Wounds. Oncologic The patient has no complaints or symptoms. Psychiatric [name], [name] ([id number]) The patient has no complaints or symptoms. Objective Constitutional Pulse regular. Respirations normal and unlabored. Afebrile. Vitals Time Taken: 8:55 AM, Height: 60 in, Source: Stated, Weight: 126 lbs, Source: Stated, BMI: 24.6, Temperature: 98.0 F, Pulse: 66 bpm, Respiratory Rate: 18 breaths/min, Blood Pressure: 131/49 mmHg. Eyes Nonicteric. Reactive to light. Ears, Nose, Mouth, and Throat Lips, teeth, and gums WNL.Marland Kitchen Moist mucosa without lesions. Neck supple and nontender. No palpable supraclavicular  or cervical adenopathy. Normal sized without goiter. Respiratory WNL. No retractions.. Cardiovascular Pedal Pulses WNL. ABI on the left is 1.17. No clubbing, cyanosis or edema. Chest Breast tissue WNL, no masses, lumps, or tenderness.. Gastrointestinal (GI) Abdomen without masses or tenderness.. No liver or spleen enlargement or tenderness.. Lymphatic No adneopathy. No adenopathy. No adenopathy. Musculoskeletal Adexa without tenderness or enlargement.. Digits and nails w/o clubbing, cyanosis, infection, petechiae, ischemia, or inflammatory conditions.Marland Kitchen Psychiatric Judgement and insight Intact.. No evidence of depression, anxiety, or agitation.. General Notes: She has 2 skin tears on the left medial calf a little below her knee and both areas have significant amount of ecchymosis around them. The exposed subcutaneous tissue is a bit dry but there is NELSON, WESTERGAARD ([id number]) no cellulitis or inflammation. Integumentary (Hair, Skin) No suspicious lesions. No crepitus or fluctuance. No peri-wound warmth or erythema. No masses.. Wound #3 status is Open. Original cause of wound was Trauma. The wound is located on the Left,Medial Lower Leg. The wound measures 6cm length x 8cm width x 0.1cm depth; 37.699cm^2 area and 3.77cm^3 volume. The wound is limited to skin breakdown. There is no tunneling or undermining noted. There is a medium amount of serous drainage noted.  The wound margin is flat and intact. There is no granulation within the wound bed. There is no necrotic tissue within the wound bed. The periwound skin appearance exhibited: Moist, Ecchymosis. The periwound skin appearance did not exhibit: Callus, Crepitus, Excoriation, Fluctuance, Friable, Induration, Localized Edema, Rash, Scarring, Dry/Scaly, Maceration, Atrophie Blanche, Cyanosis, Hemosiderin Staining, Mottled, Pallor, Rubor, Erythema. Periwound temperature was noted as No Abnormality. The periwound has tenderness on  palpation. Assessment Active Problems ICD-10 S81.812A - Laceration without foreign body, left lower leg, initial encounter L97.222 - Non-pressure chronic ulcer of left calf with fat layer exposed This pleasant 82 year old patient has had a laceration of her left calf with significant amount of ecchymosis but no cellulitis. I recommended a piece of Mepitel on the wound and foam over this. We will wrap her leg with Kerlix and Coban and leave it on for a week. All her questions have been answered. Plan Wound Cleansing: Wound #3 Left,Medial Lower Leg: Clean wound with Normal Saline. Primary Wound Dressing: Wound #3 Left,Medial Lower Leg: Mepitel One Foam ILIYAH, [name] ([id number]) Dressing Change Frequency: Wound #3 Left,Medial Lower Leg: Change dressing every week Follow-up Appointments: Wound #3 Left,Medial Lower Leg: Return Appointment in 1 week. Edema Control: Other: - Kerlix and Coban from base of toes to 3 cm below knee. This pleasant 82 year old patient has had a laceration of her left calf with significant amount of ecchymosis but no cellulitis. I recommended a piece of Mepitel on the wound and foam over this. We will wrap her leg with Kerlix and Coban and leave it on for a week. All her questions have been answered. Electronic Signature(s) Signed: 07/04/2016 4:21:29 PM By: Natasha Kanner MD, FACS Previous Signature: 07/04/2016 9:29:46 AM Version By: Natasha Kanner MD, FACS Entered By: [name] on 07/04/2016 16:21:29 [name] ([id number]) -------------------------------------------------------------------------------- ROS/PFSH Details Patient Name: [name] Date of Service: 07/04/2016 8:45 AM Medical Record Number: [id number] Patient Account Number: [id number] Date of Birth/Sex: 26-Sep-1930 (82 y.o. Female) Treating Barnett: [name] Primary Care Physician: [name] Other Clinician: Referring Physician: Aram Barnett Treating Physician/Extender:  [name] in Treatment: 0 Information Obtained From Patient Wound History Do you currently have one or more open woundso Yes How many open wounds do you currently haveo 2 Approximately how long have you had your woundso 1 week How have you been treating your wound(s) until nowo polysporin Has your wound(s) ever healed and then Barnett-openedo No Have you had any lab work done in the past montho No Have you tested positive for an antibiotic resistant organism (MRSA, VRE)o No Have you tested positive for osteomyelitis (bone infection)o No Have you had any tests for circulation on your legso No Cardiovascular Complaints and Symptoms: Positive for: Chest pain Negative for: LE edema Medical History: Positive for: Hypertension Negative for: Angina; Arrhythmia; Congestive Heart Failure; Coronary Artery Disease; Deep Vein Thrombosis; Hypotension; Myocardial Infarction; Peripheral Arterial Disease; Peripheral Venous Disease; Phlebitis; Vasculitis Past Medical History Notes: pacemaker Integumentary (Skin) Complaints and Symptoms: Positive for: Wounds Constitutional Symptoms (General Health) Complaints and Symptoms: No Complaints or Symptoms Medical History: Past Medical History Notes: Back pain Eyes [name], [name] ([id number]) Complaints and Symptoms: No Complaints or Symptoms Medical History: Negative for: Cataracts; Glaucoma; Optic Neuritis Ear/Nose/Mouth/Throat Complaints and Symptoms: No Complaints or Symptoms Medical History: Negative for: Chronic sinus problems/congestion; Middle ear problems Past Medical History Notes: bilateral hearing aides Hematologic/Lymphatic Complaints and Symptoms: No Complaints or Symptoms Medical History: Negative for: Anemia; Hemophilia; Human Immunodeficiency Virus; Lymphedema; Sickle Cell  Disease Respiratory Medical History: Negative for: Aspiration; Asthma; Chronic Obstructive Pulmonary Disease (COPD); Pneumothorax; Sleep Apnea;  Tuberculosis Gastrointestinal Complaints and Symptoms: No Complaints or Symptoms Medical History: Negative for: Cirrhosis ; Colitis; Crohnos; Hepatitis A; Hepatitis B; Hepatitis C Endocrine Complaints and Symptoms: No Complaints or Symptoms Medical History: Negative for: Type I Diabetes; Type II Diabetes Genitourinary Complaints and Symptoms: No Complaints or Symptoms Medical HistoryNAYDELINE, [name] ([id number]) Negative for: End Stage Renal Disease Immunological Complaints and Symptoms: No Complaints or Symptoms Medical History: Negative for: Lupus Erythematosus; Raynaudos; Scleroderma Musculoskeletal Medical History: Positive for: Gout; Osteoarthritis Negative for: Rheumatoid Arthritis; Osteomyelitis Neurologic Medical History: Positive for: Neuropathy - lower legs Negative for: Dementia; Quadriplegia; Paraplegia; Seizure Disorder Oncologic Complaints and Symptoms: No Complaints or Symptoms Medical History: Negative for: Received Chemotherapy; Received Radiation Psychiatric Complaints and Symptoms: No Complaints or Symptoms Medical History: Negative for: Anorexia/bulimia; Confinement Anxiety Immunizations Tetanus Vaccine: Last tetanus shot: 07/16/2010 Family and Social History Cancer: Yes - Mother, Siblings; Diabetes: Yes - Father; Heart Disease: Yes - Father, Siblings; Hereditary Spherocytosis: No; Hypertension: Yes - Father, Siblings; Kidney Disease: No; Lung Disease: Yes - Siblings; Seizures: No; Stroke: No; Thyroid Problems: No; Tuberculosis: No; Never smoker; Marital Status - Married; Alcohol Use: Never; Drug Use: No History; Caffeine Use: Never; Financial Concerns: No; Food, Clothing or Shelter Needs: No; Support System Lacking: No; Transportation Concerns: No; Advanced Directives: No; Patient does not want information on Advanced Directives; Medical Power of Attorney: Yes - Meegan Shanafelt (Copy provided) Physician Affirmation [name], [name] ([id number]) I  have reviewed and agree with the above information. Electronic Signature(s) Signed: 07/04/2016 3:03:12 PM By: Natasha Gurney Barnett, BSN, [name], BSN Signed: 07/04/2016 4:20:18 PM By: Natasha Kanner MD, FACS Entered By: Natasha Gurney Barnett, BSN, Natasha on 07/04/2016 09:45:32 [name] ([id number]) -------------------------------------------------------------------------------- SuperBill Details Patient Name: [name], [name] Date of Service: 07/04/2016 Medical Record Number: [id number] Patient Account Number: [id number] Date of Birth/Sex: 1930-03-13 (82 y.o. Female) Treating Barnett: [name] Primary Care Physician: [name] Other Clinician: Referring Physician: Aram Barnett Treating Physician/Extender: [name] in Treatment: 0 Diagnosis Coding ICD-10 Codes Code Description [phone] Laceration without foreign body, left lower leg, initial encounter L97.222 Non-pressure chronic ulcer of left calf with fat layer exposed Facility Procedures CPT4 Code: [id number] Description: 99214 - WOUND CARE VISIT-LEV 4 EST PT Modifier: Quantity: 1 Physician Procedures CPT4 Code Description: [id number] 99214 - WC PHYS LEVEL 4 - EST PT ICD-10 Description Diagnosis S81.812A Laceration without foreign body, left lower leg, ini L97.222 Non-pressure chronic ulcer of left calf with fat lay Modifier: tial encounter er exposed Quantity: 1 Electronic Signature(s) Signed: 07/04/2016 3:03:12 PM By: Natasha Gurney, Barnett, BSN, [name], BSN Signed: 07/04/2016 4:20:18 PM By: Natasha Kanner MD, FACS Previous Signature: 07/04/2016 9:29:59 AM Version By: Natasha Kanner MD, FACS Entered By: Natasha Gurney, Barnett, BSN, Natasha on 07/04/2016 09:42:27

## 2016-07-11 ENCOUNTER — Ambulatory Visit: Payer: Medicare Other | Admitting: Surgery

## 2016-07-11 ENCOUNTER — Encounter: Payer: Medicare Other | Admitting: Surgery

## 2016-07-11 DIAGNOSIS — S81812A Laceration without foreign body, left lower leg, initial encounter: Secondary | ICD-10-CM | POA: Diagnosis not present

## 2016-07-11 NOTE — Progress Notes (Signed)
ZAKARIAH, DEJARNETTE ([id number]) Visit Report for 07/11/2016 Chief Complaint Document Details Patient Name: [name], [name]. Date of Service: 07/11/2016 3:45 PM Medical Record Number: [id number] Patient Account Number: [id number] Date of Birth/Sex: 12/06/1930 (82 y.o. Female) Treating RN: Curtis Sites Primary Care Physician: Aram Beecham Other Clinician: Referring Physician: Aram Beecham Treating Physician/Extender: Rudene Re in Treatment: 1 Information Obtained from: Patient Chief Complaint Patient seen for complaints of Non-Healing Wound to the left lower extremity for about a week Electronic Signature(s) Signed: 07/11/2016 4:22:41 PM By: Evlyn Kanner MD, FACS Entered By: Evlyn Kanner on 07/11/2016 16:22:41 [name] ([id number]) -------------------------------------------------------------------------------- HPI Details Patient Name: [name] Date of Service: 07/11/2016 3:45 PM Medical Record Number: [id number] Patient Account Number: [id number] Date of Birth/Sex: 1930/01/21 (82 y.o. Female) Treating RN: Curtis Sites Primary Care Physician: Aram Beecham Other Clinician: Referring Physician: Aram Beecham Treating Physician/Extender: Rudene Re in Treatment: 1 History of Present Illness Location: left lower extremity Quality: Patient reports experiencing an aching pain to affected area(s). Severity: Patient states wound (s) are getting better. Duration: Patient has had the wound for < 1 weeks prior to presenting for treatment Timing: Pain in wound is Intermittent (comes and goes) Context: The wound occurred when the patient tripped and injured her left lower extremity where she had some skin tears. Modifying Factors: Other treatment(s) tried include: has been applying some Neosporin ointment over this Associated Signs and Symptoms: Patient reports having: no bleeding or discharge from the wound. HPI Description: 83 year old patient who  is looking much younger than his stated age comes in with a history of having a laceration to her left lower extremity which she sustained about a week ago. She has several medical comorbidities including degenerative arthritis, scoliosis, history of back surgery, pacemaker placement,AMA positive, ulnar neuropathy and left carpal tunnel syndrome. she is also had sclerotherapy for varicose veins in May 2003. her medications include some prednisone at the present time which she may be coming off soon. She went to the [place] [facility] where they have been dressing her wound and she is hear for review. Electronic Signature(s) Signed: 07/11/2016 4:22:45 PM By: Evlyn Kanner MD, FACS Entered By: Evlyn Kanner on 07/11/2016 16:22:45 [name] ([id number]) -------------------------------------------------------------------------------- Physical Exam Details Patient Name: [name] Date of Service: 07/11/2016 3:45 PM Medical Record Number: [id number] Patient Account Number: [id number] Date of Birth/Sex: [birth year] (82 y.o. Female) Treating RN: Curtis Sites Primary Care Physician: Aram Beecham Other Clinician: Referring Physician: Aram Beecham Treating Physician/Extender: Rudene Re in Treatment: 1 Constitutional . Pulse regular. Respirations normal and unlabored. Afebrile. . Eyes Nonicteric. Reactive to light. Ears, Nose, Mouth, and Throat Lips, teeth, and gums WNL.Marland Kitchen Moist mucosa without lesions. Neck supple and nontender. No palpable supraclavicular or cervical adenopathy. Normal sized without goiter. Respiratory WNL. No retractions.. Cardiovascular Pedal Pulses WNL. No clubbing, cyanosis or edema. Lymphatic No adneopathy. No adenopathy. No adenopathy. Musculoskeletal Adexa without tenderness or enlargement.. Digits and nails w/o clubbing, cyanosis, infection, petechiae, ischemia, or inflammatory conditions.. Integumentary (Hair, Skin) No suspicious lesions.  No crepitus or fluctuance. No peri-wound warmth or erythema. No masses.Marland Kitchen Psychiatric Judgement and insight Intact.. No evidence of depression, anxiety, or agitation.. Notes the skin flaps are still in place and the there are dusky they do not seem to be necrotic. No debridement was required today Electronic Signature(s) Signed: 07/11/2016 4:23:19 PM By: Evlyn Kanner MD, FACS Entered By: Evlyn Kanner on 07/11/2016 16:23:19 [name] ([id number]) -------------------------------------------------------------------------------- Physician Orders Details Patient Name: Natasha Norse  V. Date of Service: 07/11/2016 3:45 PM Medical Record Number: [id number] Patient Account Number: [id number] Date of Birth/Sex: [birth year] (82 y.o. Female) Treating RN: Curtis Sites Primary Care Physician: Aram Beecham Other Clinician: Referring Physician: Aram Beecham Treating Physician/Extender: Rudene Re in Treatment: 1 Verbal / Phone Orders: Yes Clinician: Curtis Sites Read Back and Verified: Yes Diagnosis Coding Wound Cleansing Wound #3 Left,Medial Lower Leg o Clean wound with Normal Saline. Anesthetic Wound #3 Left,Medial Lower Leg o Topical Lidocaine 4% cream applied to wound bed prior to debridement - for clinic use Skin Barriers/Peri-Wound Care Wound #3 Left,Medial Lower Leg o Skin Prep Primary Wound Dressing Wound #3 Left,Medial Lower Leg o Other: - Mepitel Secondary Dressing Wound #3 Left,Medial Lower Leg o Foam Dressing Change Frequency Wound #3 Left,Medial Lower Leg o Change dressing every week Follow-up Appointments Wound #3 Left,Medial Lower Leg o Return Appointment in 1 week. Edema Control o Elevate legs to the level of the heart and pump ankles as often as possible o Other: - Kerlix and Coban from base of toes to 3 cm below knee. Additional Orders / Instructions Wound #3 Left,Medial Lower Leg o Increase protein intake. KHILYNN, BORNTREGER ([id number]) Electronic Signature(s) Signed: 07/11/2016 4:24:16 PM By: Evlyn Kanner MD, FACS Signed: 07/11/2016 4:29:04 PM By: Curtis Sites Entered By: Curtis Sites on 07/11/2016 16:02:35 [name] ([id number]) -------------------------------------------------------------------------------- Problem List Details Patient Name: TOBEY, LIPPARD Date of Service: 07/11/2016 3:45 PM Medical Record Number: [id number] Patient Account Number: [id number] Date of Birth/Sex: 02/24/1930 (82 y.o. 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 Female) Treating RN: Curtis Sites Primary Care Physician: Aram Beecham Other Clinician: Referring Physician: Aram Beecham Treating Physician/Extender: Rudene Re in Treatment: 1 Subjective Chief Complaint Information obtained from Patient Patient seen for complaints of Non-Healing Wound to the left lower extremity for about a week History of Present Illness (HPI) The following HPI elements were documented for the patient's wound: Location: left lower extremity Quality:  Patient reports experiencing an aching pain to affected area(s). Severity: Patient states wound (s) are getting better. Duration: Patient has had the wound for < 1 weeks prior to presenting for treatment Timing: Pain in wound is Intermittent (comes and goes) Context: The wound occurred when the patient tripped and injured her left lower extremity where she had some skin tears. Modifying Factors: Other treatment(s) tried include: has been applying some Neosporin ointment over this Associated Signs and Symptoms: Patient reports having: no bleeding or discharge from the wound. 82 year old patient who is looking much younger than his stated age comes in with a history of having a laceration to her left lower extremity which she sustained about a week ago. She has several medical comorbidities including degenerative arthritis, scoliosis, history of back surgery, pacemaker placement,AMA positive, ulnar neuropathy and left carpal tunnel syndrome. she is also had sclerotherapy for varicose veins in May 2003. her medications  include some prednisone at the present time which she may be coming off soon. She went to the [place] [facility] where they have been dressing her wound and she is hear for review. Objective Constitutional Pulse regular. Respirations normal and unlabored. Afebrile. Vitals Time Taken: 3:49 PM, Height: 60 in, Weight: 126 lbs, BMI: 24.6, Temperature: 98.6 F, Pulse: 75 bpm, Respiratory Rate: 18 breaths/min, Blood Pressure: 100/43 mmHg. SOILA, IPPOLITI VMarland Kitchen ([id number]) Eyes Nonicteric. Reactive to light. Ears, Nose, Mouth, and Throat Lips, teeth, and gums WNL.Marland Kitchen Moist mucosa without lesions. Neck supple and nontender. No palpable supraclavicular or cervical adenopathy. Normal sized without goiter. Respiratory WNL. No retractions.. Cardiovascular Pedal Pulses WNL. No clubbing, cyanosis or edema. Lymphatic No adneopathy. No adenopathy. No adenopathy. Musculoskeletal Adexa without  tenderness or enlargement.. Digits and nails w/o clubbing, cyanosis, infection, petechiae, ischemia, or inflammatory conditions.Marland Kitchen Psychiatric Judgement and insight Intact.. No evidence of depression, anxiety, or agitation.. General Notes: the skin flaps are still in place and the there are dusky they do not seem to be necrotic. No debridement was required today Integumentary (Hair, Skin) No suspicious lesions. No crepitus or fluctuance. No peri-wound warmth or erythema. No masses.. Wound #3 status is Open. Original cause of wound was Trauma. The wound is located on the Left,Medial Lower Leg. The wound measures 4.6cm length x 7.3cm width x 0.1cm depth; 26.374cm^2 area and 2.637cm^3 volume. The wound is limited to skin breakdown. There is no tunneling or undermining noted. There is a medium amount of serous drainage noted. The wound margin is flat and intact. There is no granulation within the wound bed. There is no necrotic tissue within the wound bed. The periwound skin appearance exhibited: Moist, Ecchymosis. The periwound skin appearance did not exhibit: Callus, Crepitus, Excoriation, Fluctuance, Friable, Induration, Localized Edema, Rash, Scarring, Dry/Scaly, Maceration, Atrophie Blanche, Cyanosis, Hemosiderin Staining, Mottled, Pallor, Rubor, Erythema. Periwound temperature was noted as No Abnormality. The periwound has tenderness on palpation. Assessment Active Problems ICD-10 FAATIMA, PEIKERT ([id number]) [phone] - Laceration without foreign body, left lower leg, initial encounter L97.222 - Non-pressure chronic ulcer of left calf with fat layer exposed Plan Wound Cleansing: Wound #3 Left,Medial Lower Leg: Clean wound with Normal Saline. Anesthetic: Wound #3 Left,Medial Lower Leg: Topical Lidocaine 4% cream applied to wound bed prior to debridement - for clinic use Skin Barriers/Peri-Wound Care: Wound #3 Left,Medial Lower Leg: Skin Prep Primary Wound Dressing: Wound #3  Left,Medial Lower Leg: Other: - Mepitel Secondary Dressing: Wound #3 Left,Medial Lower Leg: Foam Dressing Change Frequency: Wound #3 Left,Medial Lower Leg: Change dressing every week Follow-up Appointments: Wound #3 Left,Medial Lower Leg: Return Appointment in 1 week. Edema Control: Elevate legs to the level of the heart and pump ankles as often as possible Other: - Kerlix and Coban from base of toes to 3 cm below knee. Additional Orders / Instructions: Wound #3 Left,Medial Lower Leg: Increase protein intake. I recommended a piece of Mepitel on the wound and foam over this. We will wrap her leg with Kerlix and Coban and leave it on for a week. All her questions have been answered. KEIMANI, PAIR ([id number]) Electronic Signature(s) Signed: 07/11/2016 4:23:44 PM By: Evlyn Kanner MD, FACS Entered By: Evlyn Kanner on 07/11/2016 16:23:44 [name] ([id number]) -------------------------------------------------------------------------------- SuperBill Details Patient Name: [name] Date of Service: 07/11/2016 Medical Record Number: [id number] Patient Account Number: [id number] Date of Birth/Sex: 1929/12/20 (82 y.o. Female) Treating RN: Curtis Sites Primary Care Physician: Aram Beecham Other Clinician: Referring Physician: Aram Beecham Treating Physician/Extender: Evlyn Kanner  Weeks in Treatment: 1 Diagnosis Coding ICD-10 Codes Code Description [phone] Laceration without foreign body, left lower leg, initial encounter L97.222 Non-pressure chronic ulcer of left calf with fat layer exposed Facility Procedures CPT4 Code: [id number] Description: 99213 - WOUND CARE VISIT-LEV 3 EST PT Modifier: Quantity: 1 Physician Procedures CPT4 Code Description: [id number] 99213 - WC PHYS LEVEL 3 - EST PT ICD-10 Description Diagnosis S81.812A Laceration without foreign body, left lower leg, ini L97.222 Non-pressure chronic ulcer of left calf with fat lay Modifier: tial  encounter er exposed Quantity: 1 Electronic Signature(s) Signed: 07/11/2016 4:23:55 PM By: Evlyn Kanner MD, FACS Entered By: Evlyn Kanner on 07/11/2016 16:23:55

## 2016-07-11 NOTE — Progress Notes (Signed)
[name], [name] ([id number]) Visit Report for 07/11/2016 Arrival Information Details Patient Name: [name], [name]. Date of Service: 07/11/2016 3:45 PM Medical Record Number: [id number] Patient Account Number: [id number] Date of Birth/Sex: Oct 16, 1930 (82 y.o. Female) Treating RN: Curtis Sites Primary Care Physician: Aram Beecham Other Clinician: Referring Physician: Aram Beecham Treating Physician/Extender: Rudene Re in Treatment: 1 Visit Information History Since Last Visit All ordered tests and consults were completed: No Patient Arrived: [name] Added or deleted any medications: No Arrival Time: 15:47 Any new allergies or adverse reactions: No Accompanied By: self Had a fall or experienced change in No Transfer Assistance: None activities of daily living that may affect Patient Identification Verified: Yes risk of falls: Secondary Verification Process Completed: Yes Signs or symptoms of abuse/neglect since last No Patient Requires Transmission-Based No visito Precautions: Hospitalized since last visit: No Patient Has Alerts: No Pain Present Now: No Electronic Signature(s) Signed: 07/11/2016 4:29:04 PM By: Curtis Sites Entered By: Curtis Sites on 07/11/2016 15:48:03 [name] ([id number]) -------------------------------------------------------------------------------- Clinic Level of Care Assessment Details Patient Name: [name] Date of Service: 07/11/2016 3:45 PM Medical Record Number: [id number] Patient Account Number: [id number] Date of Birth/Sex: 02-Mar-1930 (82 y.o. Female) Treating RN: Curtis Sites Primary Care Physician: Aram Beecham Other Clinician: Referring Physician: Aram Beecham Treating Physician/Extender: Rudene Re in Treatment: 1 Clinic Level of Care Assessment Items TOOL 4 Quantity Score []  - Use when only an EandM is performed on FOLLOW-UP visit 0 ASSESSMENTS - Nursing Assessment / Reassessment X -  Reassessment of Co-morbidities (includes updates in patient status) 1 10 X - Reassessment of Adherence to Treatment Plan 1 5 ASSESSMENTS - Wound and Skin Assessment / Reassessment X - Simple Wound Assessment / Reassessment - one wound 1 5 []  - Complex Wound Assessment / Reassessment - multiple wounds 0 []  - Dermatologic / Skin Assessment (not related to wound area) 0 ASSESSMENTS - Focused Assessment X - Circumferential Edema Measurements - multi extremities 1 5 []  - Nutritional Assessment / Counseling / Intervention 0 X - Lower Extremity Assessment (monofilament, tuning fork, pulses) 1 5 []  - Peripheral Arterial Disease Assessment (using hand held doppler) 0 ASSESSMENTS - Ostomy and/or Continence Assessment and Care []  - Incontinence Assessment and Management 0 []  - Ostomy Care Assessment and Management (repouching, etc.) 0 PROCESS - Coordination of Care X - Simple Patient / Family Education for ongoing care 1 15 []  - Complex (extensive) Patient / Family Education for ongoing care 0 []  - Staff obtains , Records, Test Results / Process Orders 0 []  - Staff telephones HHA, Nursing Homes / Clarify orders / etc 0 []  - Routine Transfer to another Facility (non-emergent condition) 0 [name], [name] ( ) []  - Routine Hospital Admission (non-emergent condition) 0 []  - New Admissions / / Ordering NPWT, Apligraf, etc. 0 []  - Emergency Hospital Admission (emergent condition) 0 X - Simple Discharge Coordination 1 10 []  - Complex (extensive) Discharge Coordination 0 PROCESS - Special Needs []  - Pediatric / Minor Patient Management 0 []  - Isolation Patient Management 0 []  - Hearing / Language / Visual special needs 0 []  - Assessment of Community assistance (transportation, D/C planning, etc.) 0 []  - Additional assistance / Altered mentation 0 []  - Support Surface(s) Assessment (bed, cushion, seat, etc.) 0 INTERVENTIONS - Wound Cleansing / Measurement X -  Simple Wound Cleansing - one wound 1 5 []  - Complex Wound Cleansing - multiple wounds 0 X - Wound Imaging (photographs - any number of wounds) 1 5 []  -  Wound Tracing (instead of photographs) 0 X - Simple Wound Measurement - one wound 1 5 []  - Complex Wound Measurement - multiple wounds 0 INTERVENTIONS - Wound Dressings []  - Small Wound Dressing one or multiple wounds 0 []  - Medium Wound Dressing one or multiple wounds 0 X - Large Wound Dressing one or multiple wounds 1 20 []  - Application of Medications - topical 0 []  - Application of Medications - injection 0 INTERVENTIONS - Miscellaneous []  - External ear exam 0 [name], [name] ( ) []  - Specimen Collection (cultures, biopsies, blood, body fluids, etc.) 0 []  - Specimen(s) / Culture(s) sent or taken to Lab for analysis 0 []  - Patient Transfer (multiple staff / Lift / Similar devices) 0 []  - Simple Staple / Suture removal (25 or less) 0 []  - Complex Staple / Suture removal (26 or more) 0 []  - Hypo / Hyperglycemic Management (close monitor of Blood Glucose) 0 []  - Ankle / Brachial Index (ABI) - do not check if billed separately 0 X - Vital Signs 1 5 Has the patient been seen at the hospital within the last three years: Yes Total Score: 95 Level Of Care: New/Established - Level 3 Electronic Signature(s) Signed: 07/11/2016 4:29:04 PM By: Entered By: on 07/11/2016 16:19:54 [id number] ( ) -------------------------------------------------------------------------------- Encounter Discharge Information Details Patient Name: Date of Service: 07/11/2016 3:45 PM Medical Record Number: Michiel Sites Patient Account Number: Date of Birth/Sex: [birth year] (82 y.o. Female) Treating RN: Primary Care Physician: 07/13/2016 Other Clinician: Referring Physician: Curtis Sites Treating Physician/Extender: Curtis Sites in Treatment:  1 Encounter Discharge Information Items Discharge Pain Level: 0 Discharge Condition: Stable Ambulatory Status: Cane Discharge Destination: Home Transportation: Private Auto Accompanied By: self Schedule Follow-up Appointment: Yes Medication Reconciliation completed No and provided to Patient/Care Avanna Sowder: Provided on Clinical Summary of Care: 07/11/2016 Form Type Recipient Paper Patient [facility] Electronic Signature(s) Signed: 07/11/2016 4:14:41 PM By: [name] Entered By: 07/13/2016 on 07/11/2016 16:14:40 [id number] (13/05/1930) -------------------------------------------------------------------------------- Lower Extremity Assessment Details Patient Name: 89 Date of Service: 07/11/2016 3:45 PM Medical Record Number: Aram Beecham Patient Account Number: Aram Beecham Date of Birth/Sex: Apr 11, 1930 (82 y.o. Female) Treating RN: [facility] Primary Care Physician: 07/13/2016 Other Clinician: Referring Physician: Gwenlyn Perking Treating Physician/Extender: Gwenlyn Perking in Treatment: 1 Edema Assessment Assessed: [Left: No] [Right: No] E[Left: dema] [Right: :] Calf Left: Right: Point of Measurement: 24 cm From Medial Instep 31 cm cm Ankle Left: Right: Point of Measurement: 10 cm From Medial Instep 19.2 cm cm Vascular Assessment Pulses: Posterior Tibial Dorsalis Pedis Palpable: [Left:Yes] Extremity colors, hair growth, and conditions: Extremity Color: [Left:Hyperpigmented] Temperature of Extremity: [Left:Warm] Capillary Refill: [Left:< 3 seconds] Toe Nail Assessment Left: Right: Thick: No Discolored: No Deformed: No Improper Length and Hygiene: No Electronic Signature(s) Signed: 07/11/2016 4:29:04 PM By: [name] Entered By: [id number] on 07/11/2016 15:53:15 07/13/2016 ([id number]) -------------------------------------------------------------------------------- Multi Wound Chart Details Patient Name: [id number] Date of Service: 07/11/2016 3:45 PM Medical Record Number: 91 Patient Account Number: Curtis Sites Date of Birth/Sex: Mar 11, 1930 (82 y.o. Female) Treating RN: Rudene Re Primary Care Physician: 07/13/2016 Other Clinician: Referring Physician: Curtis Sites Treating Physician/Extender: Curtis Sites in Treatment: 1 Vital Signs Height(in): 60 Pulse(bpm): 75 Weight(lbs): 126 Blood Pressure 100/43 (mmHg): Body Mass Index(BMI): 25 Temperature(F): 98.6 Respiratory Rate 18 (breaths/min): Photos: [N/A:N/A] Wound Location: Left Lower Leg - Medial N/A N/A Wounding Event: Trauma N/A N/A Primary Etiology:  Trauma, Other N/A N/A Comorbid History: Hypertension, Gout, N/A N/A Osteoarthritis, Neuropathy Date Acquired: 06/27/2016 N/A N/A Weeks of Treatment: 1 N/A N/A Wound Status: Open N/A N/A Clustered Wound: Yes N/A N/A Measurements L x W x D 4.6x7.3x0.1 N/A N/A (cm) Area (cm) : 26.374 N/A N/A Volume (cm) : 2.637 N/A N/A % Reduction in Area: 30.00% N/A N/A % Reduction in Volume: 30.10% N/A N/A Classification: Partial Thickness N/A N/A Exudate Amount: Medium N/A N/A Exudate Type: Serous N/A N/A Exudate Color: amber N/A N/A Wound Margin: Flat and Intact N/A N/A Granulation Amount: None Present (0%) N/A N/A Necrotic Amount: None Present (0%) N/A N/A [name], [name] ([id number]) Exposed Structures: Fascia: No N/A N/A Fat: No Tendon: No Muscle: No Joint: No Bone: No Limited to Skin Breakdown Epithelialization: None N/A N/A Periwound Skin Texture: Edema: No N/A N/A Excoriation: No Induration: No Callus: No Crepitus: No Fluctuance: No Friable: No Rash: No Scarring: No Periwound Skin Moist: Yes N/A N/A Moisture: Maceration: No Dry/Scaly: No Periwound Skin Color: Ecchymosis: Yes N/A N/A Atrophie Blanche: No Cyanosis: No Erythema: No Hemosiderin Staining: No Mottled: No Pallor: No Rubor: No Temperature: No Abnormality N/A N/A Tenderness  on Yes N/A N/A Palpation: Wound Preparation: Ulcer Cleansing: N/A N/A Rinsed/Irrigated with Saline Topical Anesthetic Applied: Other: lidociane 4% Treatment Notes Electronic Signature(s) Signed: 07/11/2016 4:29:04 PM By: Curtis Sites Entered By: Curtis Sites on 07/11/2016 15:55:35 [name] ([id number]) -------------------------------------------------------------------------------- Multi-Disciplinary Care Plan Details Patient Name: [name] Date of Service: 07/11/2016 3:45 PM Medical Record Number: [id number] Patient Account Number: [id number] Date of Birth/Sex: 04/14/1930 (82 y.o. Female) Treating RN: Curtis Sites Primary Care Physician: Aram Beecham Other Clinician: Referring Physician: Aram Beecham Treating Physician/Extender: Rudene Re in Treatment: 1 Active Inactive Abuse / Safety / Falls / Self Care Management Nursing Diagnoses: Potential for falls Goals: Patient will remain injury free Date Initiated: 07/04/2016 Goal Status: Active Interventions: Assess fall risk on admission and as needed Notes: Orientation to the Wound Care Program Nursing Diagnoses: Knowledge deficit related to the wound healing center program Goals: Patient/caregiver will verbalize understanding of the Wound Healing Center Program Date Initiated: 07/04/2016 Goal Status: Active Interventions: Provide education on orientation to the wound center Notes: Soft Tissue Infection Nursing Diagnoses: Impaired tissue integrity Potential for infection: soft tissue Goals: Patient will remain free of wound infection [name], [name] ([id number]) Date Initiated: 07/04/2016 Goal Status: Active Interventions: Assess signs and symptoms of infection every visit Notes: Wound/Skin Impairment Nursing Diagnoses: Impaired tissue integrity Goals: Ulcer/skin breakdown will have a volume reduction of 30% by week 4 Date Initiated: 07/04/2016 Goal Status:  Active Interventions: Assess ulceration(s) every visit Notes: Electronic Signature(s) Signed: 07/11/2016 4:29:04 PM By: Curtis Sites Entered By: Curtis Sites on 07/11/2016 15:55:28 [name] ([id number]) -------------------------------------------------------------------------------- Pain Assessment Details Patient Name: [name] Date of Service: 07/11/2016 3:45 PM Medical Record Number: [id number] Patient Account Number: [id number] Date of Birth/Sex: [birth year] (82 y.o. Female) Treating RN: Curtis Sites Primary Care Physician: Aram Beecham Other Clinician: Referring Physician: Aram Beecham Treating Physician/Extender: Rudene Re in Treatment: 1 Active Problems Location of Pain Severity and Description of Pain Patient Has Paino No Site Locations With Dressing Change: No Pain Management and Medication Current Pain Management: Electronic Signature(s) Signed: 07/11/2016 4:29:04 PM By: Curtis Sites Entered By: Curtis Sites on 07/11/2016 15:49:32 [name] ([id number]) -------------------------------------------------------------------------------- Patient/Caregiver Education Details Patient Name: [name] Date of Service: 07/11/2016 3:45 PM Medical Record Number: [id number] Patient Account Number: [id number] Date of Birth/Gender:  09-Jul-1930 (82 y.o. Female) Treating RN: Curtis Sites Primary Care Physician: Aram Beecham Other Clinician: Referring Physician: Aram Beecham Treating Physician/Extender: Rudene Re in Treatment: 1 Education Assessment Education Provided To: Patient Education Topics Provided Wound/Skin Impairment: Handouts: Other: do not get your wrap wet Methods: Demonstration, Explain/Verbal Responses: State content correctly Electronic Signature(s) Signed: 07/11/2016 4:29:04 PM By: Curtis Sites Entered By: Curtis Sites on 07/11/2016 16:04:27 [name]  ([id number]) -------------------------------------------------------------------------------- Wound Assessment Details Patient Name: [name] Date of Service: 07/11/2016 3:45 PM Medical Record Number: [id number] Patient Account Number: [id number] Date of Birth/Sex: [birth year] (82 y.o. Female) Treating RN: Curtis Sites Primary Care Physician: Aram Beecham Other Clinician: Referring Physician: Aram Beecham Treating Physician/Extender: Rudene Re in Treatment: 1 Wound Status Wound Number: 3 Primary Trauma, Other Etiology: Wound Location: Left Lower Leg - Medial Wound Status: Open Wounding Event: Trauma Comorbid Hypertension, Gout, Osteoarthritis, Date Acquired: 06/27/2016 History: Neuropathy Weeks Of Treatment: 1 Clustered Wound: Yes Photos Wound Measurements Length: (cm) 4.6 Width: (cm) 7.3 Depth: (cm) 0.1 Area: (cm) 26.374 Volume: (cm) 2.637 % Reduction in Area: 30% % Reduction in Volume: 30.1% Epithelialization: None Tunneling: No Undermining: No Wound Description Classification: Partial Thickness Wound Margin: Flat and Intact Exudate Amount: Medium Exudate Type: Serous Exudate Color: amber Foul Odor After Cleansing: No Wound Bed Granulation Amount: None Present (0%) Exposed Structure Necrotic Amount: None Present (0%) Fascia Exposed: No Fat Layer Exposed: No Tendon Exposed: No Muscle Exposed: No CATHYANN, KILFOYLE ([id number]) Joint Exposed: No Bone Exposed: No Limited to Skin Breakdown Periwound Skin Texture Texture Color No Abnormalities Noted: No No Abnormalities Noted: No Callus: No Atrophie Blanche: No Crepitus: No Cyanosis: No Excoriation: No Ecchymosis: Yes Fluctuance: No Erythema: No Friable: No Hemosiderin Staining: No Induration: No Mottled: No Localized Edema: No Pallor: No Rash: No Rubor: No Scarring: No Temperature / Pain Moisture Temperature: No Abnormality No Abnormalities Noted: No Tenderness on  Palpation: Yes Dry / Scaly: No Maceration: No Moist: Yes Wound Preparation Ulcer Cleansing: Rinsed/Irrigated with Saline Topical Anesthetic Applied: Other: lidociane 4%, Treatment Notes Wound #3 (Left, Medial Lower Leg) 1. Cleansed with: Cleanse wound with antibacterial soap and water 2. Anesthetic Topical Lidocaine 4% cream to wound bed prior to debridement 3. Peri-wound Care: Skin Prep 4. Dressing Applied: Mepitel 5. Secondary Dressing Applied Foam 7. Secured with Tape Notes Kerlix and Coban from base of toes to 3cm below knee. Electronic Signature(s) Signed: 07/11/2016 4:29:04 PM By: Curtis Sites Entered By: Curtis Sites on 07/11/2016 15:55:09 [name] ([id number]) [address] [place]as ([id number]) -------------------------------------------------------------------------------- Vitals Details Patient Name: [name] Date of Service: 07/11/2016 3:45 PM Medical Record Number: [id number] Patient Account Number: [id number] Date of Birth/Sex: [birth year] (82 y.o. Female) Treating RN: Curtis Sites Primary Care Physician: Aram Beecham Other Clinician: Referring Physician: Aram Beecham Treating Physician/Extender: Rudene Re in Treatment: 1 Vital Signs Time Taken: 15:49 Temperature (F): 98.6 Height (in): 60 Pulse (bpm): 75 Weight (lbs): 126 Respiratory Rate (breaths/min): 18 Body Mass Index (BMI): 24.6 Blood Pressure (mmHg): 100/43 Reference Range: 80 - 120 mg / dl Electronic Signature(s) Signed: 07/11/2016 4:29:04 PM By: Curtis Sites Entered By: Curtis Sites on 07/11/2016 15:50:01

## 2016-07-13 ENCOUNTER — Encounter: Payer: Medicare Other | Attending: Internal Medicine

## 2016-07-13 DIAGNOSIS — W19XXXA Unspecified fall, initial encounter: Secondary | ICD-10-CM | POA: Insufficient documentation

## 2016-07-13 DIAGNOSIS — M1990 Unspecified osteoarthritis, unspecified site: Secondary | ICD-10-CM | POA: Diagnosis not present

## 2016-07-13 DIAGNOSIS — I10 Essential (primary) hypertension: Secondary | ICD-10-CM | POA: Diagnosis not present

## 2016-07-13 DIAGNOSIS — G629 Polyneuropathy, unspecified: Secondary | ICD-10-CM | POA: Insufficient documentation

## 2016-07-13 DIAGNOSIS — Z880 Allergy status to penicillin: Secondary | ICD-10-CM | POA: Diagnosis not present

## 2016-07-13 DIAGNOSIS — M109 Gout, unspecified: Secondary | ICD-10-CM | POA: Diagnosis not present

## 2016-07-13 DIAGNOSIS — M419 Scoliosis, unspecified: Secondary | ICD-10-CM | POA: Diagnosis not present

## 2016-07-13 DIAGNOSIS — S81812A Laceration without foreign body, left lower leg, initial encounter: Secondary | ICD-10-CM | POA: Diagnosis present

## 2016-07-13 DIAGNOSIS — L97222 Non-pressure chronic ulcer of left calf with fat layer exposed: Secondary | ICD-10-CM | POA: Diagnosis not present

## 2016-07-13 DIAGNOSIS — Z950 Presence of cardiac pacemaker: Secondary | ICD-10-CM | POA: Insufficient documentation

## 2016-07-14 ENCOUNTER — Ambulatory Visit: Payer: Medicare Other | Admitting: Surgery

## 2016-07-14 NOTE — Progress Notes (Signed)
Natasha, Barnett ([id number]) Visit Report for 07/13/2016 Arrival Information Details Patient Name: [name], [name]. Date of Service: 07/13/2016 9:45 AM Medical Record Patient Account Number: [id number] Number: Treating RN: Huel Coventry 1930-03-22 (82 y.o. Other Clinician: Date of Birth/Sex: Female) Treating ROBSON, MICHAEL Primary Care Physician/Extender: Tawanna Solo Physician: Referring Physician: Bing Plume in Treatment: 1 Visit Information History Since Last Visit Added or deleted any medications: No Patient Arrived: Ambulatory Any new allergies or adverse reactions: No Arrival Time: 10:20 Had a fall or experienced change in No Accompanied By: self activities of daily living that may affect Transfer Assistance: None risk of falls: Patient Identification Verified: Yes Signs or symptoms of abuse/neglect since last No Secondary Verification Process Yes visito Completed: Hospitalized since last visit: No Patient Requires Transmission-Based No Has Dressing in Place as Prescribed: Yes Precautions: Has Compression in Place as Prescribed: Yes Patient Has Alerts: No Pain Present Now: No Electronic Signature(s) Signed: 07/14/2016 8:02:19 AM By: Elliot Gurney, RN, BSN, Kim RN, BSN Entered By: Elliot Gurney, RN, BSN, Kim on 07/13/2016 10:28:35 [name] ([id number]) -------------------------------------------------------------------------------- Clinic Level of Care Assessment Details Patient Name: [name] Date of Service: 07/13/2016 9:45 AM Medical Record Patient Account Number: [id number] Number: Treating RN: Huel Coventry [birth year] (82 y.o. Other Clinician: Date of Birth/Sex: Female) Treating ROBSON, MICHAEL Primary Care Physician/Extender: Tawanna Solo Physician: Referring Physician: Bing Plume in Treatment: 1 Clinic Level of Care Assessment Items TOOL 4 Quantity Score []  - Use when only an EandM is performed on FOLLOW-UP  visit 0 ASSESSMENTS - Nursing Assessment / Reassessment []  - Reassessment of Co-morbidities (includes updates in patient status) 0 X - Reassessment of Adherence to Treatment Plan 1 5 ASSESSMENTS - Wound and Skin Assessment / Reassessment X - Simple Wound Assessment / Reassessment - one wound 1 5 []  - Complex Wound Assessment / Reassessment - multiple wounds 0 []  - Dermatologic / Skin Assessment (not related to wound area) 0 ASSESSMENTS - Focused Assessment []  - Circumferential Edema Measurements - multi extremities 0 []  - Nutritional Assessment / Counseling / Intervention 0 []  - Lower Extremity Assessment (monofilament, tuning fork, pulses) 0 []  - Peripheral Arterial Disease Assessment (using hand held doppler) 0 ASSESSMENTS - Ostomy and/or Continence Assessment and Care []  - Incontinence Assessment and Management 0 []  - Ostomy Care Assessment and Management (repouching, etc.) 0 PROCESS - Coordination of Care X - Simple Patient / Family Education for ongoing care 1 15 []  - Complex (extensive) Patient / Family Education for ongoing care 0 []  - Staff obtains Consents, Records, Test Results / Process Orders 0 ALAYSHA, ZIMMERER ([id number]) []  - Staff telephones HHA, Nursing Homes / Clarify orders / etc 0 []  - Routine Transfer to another Facility (non-emergent condition) 0 []  - Routine Hospital Admission (non-emergent condition) 0 []  - New Admissions / Manufacturing engineer / Ordering NPWT, Apligraf, etc. 0 []  - Emergency Hospital Admission (emergent condition) 0 X - Simple Discharge Coordination 1 10 []  - Complex (extensive) Discharge Coordination 0 PROCESS - Special Needs []  - Pediatric / Minor Patient Management 0 []  - Isolation Patient Management 0 []  - Hearing / Language / Visual special needs 0 []  - Assessment of Community assistance (transportation, D/C planning, etc.) 0 []  - Additional assistance / Altered mentation 0 []  - Support Surface(s) Assessment (bed, cushion, seat,  etc.) 0 INTERVENTIONS - Wound Cleansing / Measurement []  - Simple Wound Cleansing - one wound 0 []  - Complex Wound Cleansing - multiple wounds 0 []  -  Wound Imaging (photographs - any number of wounds) 0 []  - Wound Tracing (instead of photographs) 0 X - Simple Wound Measurement - one wound 1 5 []  - Complex Wound Measurement - multiple wounds 0 INTERVENTIONS - Wound Dressings []  - Small Wound Dressing one or multiple wounds 0 []  - Medium Wound Dressing one or multiple wounds 0 []  - Large Wound Dressing one or multiple wounds 0 []  - Application of Medications - topical 0 []  - Application of Medications - injection 0 ARYIANNA, EARWOOD ( ) INTERVENTIONS - Miscellaneous []  - External ear exam 0 []  - Specimen Collection (cultures, biopsies, blood, body fluids, etc.) 0 []  - Specimen(s) / Culture(s) sent or taken to Lab for analysis 0 []  - Patient Transfer (multiple staff / / Similar devices) 0 []  - Simple Staple / Suture removal (25 or less) 0 []  - Complex Staple / Suture removal (26 or more) 0 []  - Hypo / Hyperglycemic Management (close monitor of Blood Glucose) 0 []  - Ankle / Brachial Index (ABI) - do not check if billed separately 0 []  - Vital Signs 0 Has the patient been seen at the hospital within the last three years: Yes Total Score: 40 Level Of Care: New/Established - Level 2 Electronic Signature(s) Signed: 07/14/2016 8:02:19 AM By: , RN, BSN, Kim RN, BSN Entered By: , RN, BSN, Kim on 07/13/2016 10:32:12 [name] ([id number]) -------------------------------------------------------------------------------- Encounter Discharge Information Details Patient Name: Date of Service: 07/13/2016 9:45 AM Medical Record Patient Account Number:  Number: Treating RN: Nurse, adult 06-16-1930 (82 y.o. Other Clinician: Date of Birth/Sex: Female) Treating ROBSON, MICHAEL Primary Care Physician/Extender: Physician: Referring Physician: in Treatment: 1 Encounter Discharge Information Items Discharge Pain Level: 0 Discharge Condition: Stable Ambulatory Status: Cane Discharge Destination: Home Private Transportation: Auto Accompanied By: self Schedule Follow-up Appointment: No Medication Reconciliation completed and Yes provided to Patient/Care Liana Camerer: Clinical Summary of Care: Electronic Signature(s) Signed: 07/14/2016 8:02:19 AM By: 09/13/2016, RN, BSN, Kim RN, BSN Entered By: Elliot Gurney, RN, BSN, Kim on 07/13/2016 10:31:39 09/12/2016 ([name]) -------------------------------------------------------------------------------- Patient/Caregiver Education Details Patient Name: [id number] Date of Service: 07/13/2016 9:45 AM Medical Record Patient Account Number: 09/12/2016 [id number] Number: Treating RN: [id number] 07-May-1930 (82 y.o. Other Clinician: Date of Birth/Gender: Female) Treating ROBSON, MICHAEL Primary Care Physician/Extender: 01-21-1990 Physician: Tawanna Solo in Treatment: 1 Referring Physician: Bing Plume Education Assessment Education Provided To: Patient Education Topics Provided Venous: Handouts: Controlling Swelling with Multilayered Compression Wraps Methods: Demonstration, Explain/Verbal Responses: State content correctly Wound/Skin Impairment: Electronic Signature(s) Signed: 07/14/2016 8:02:19 AM By: Elliot Gurney, RN, BSN, Kim RN, BSN Entered By: Elliot Gurney, RN, BSN, Kim on 07/13/2016 10:31:01 [name] ([id number]) -------------------------------------------------------------------------------- Wound Assessment Details Patient Name: [name] Date of Service: 07/13/2016 9:45 AM Medical Record Patient Account Number: [id number] Number: Treating RN: Huel Coventry 1930-01-07 (82 y.o. Other Clinician: Date of Birth/Sex: Female) Treating ROBSON, MICHAEL Primary Care Physician/Extender: Tawanna Solo Physician: Referring Physician: Tania Ade in Treatment: 1 Wound Status Wound Number: 3 Primary Trauma, Other Etiology: Wound Location: Left Lower Leg - Medial Wound Status: Open Wounding Event: Trauma Comorbid Hypertension, Gout, Osteoarthritis, Date Acquired: 06/27/2016 History: Neuropathy Weeks Of Treatment: 1 Clustered Wound: Yes Wound Measurements Length: (cm) 4.5 Width: (cm) 7.3 Depth: (cm) 0.1 Area: (cm) 25.8 Volume: (cm) 2.58 % Reduction in Area: 31.6% % Reduction in Volume: 31.6% Epithelialization: None Wound Description Classification: Partial Thickness Wound Margin: Flat and Intact  Exudate Amount: Medium Exudate Type: Serous Exudate Color: amber Foul Odor After Cleansing: No Wound Bed Granulation Amount: None Present (0%) Exposed Structure Necrotic Amount: None Present (0%) Fascia Exposed: No Fat Layer Exposed: No Tendon Exposed: No Muscle Exposed: No Joint Exposed: No Bone Exposed: No Limited to Skin Breakdown Periwound Skin Texture Texture Color No Abnormalities Noted: No No Abnormalities Noted: No Callus: No Atrophie Blanche: No RAYCHELL, HOLCOMB ([id number]) Crepitus: No Cyanosis: No Excoriation: No Ecchymosis: Yes Fluctuance: No Erythema: No Friable: No Hemosiderin Staining: No Induration: No Mottled: No Localized Edema: No Pallor: No Rash: No Rubor: No Scarring: No Temperature / Pain Moisture Temperature: No Abnormality No Abnormalities Noted: No Tenderness on Palpation: Yes Dry / Scaly: No Maceration: No Moist: Yes Wound Preparation Ulcer Cleansing: Not Cleansed Topical Anesthetic Applied: None Treatment Notes Wound #3 (Left, Medial Lower Leg) 4. Dressing Applied: Mepitel 5. Secondary Dressing Applied Foam 7. Secured with Other (specify in notes) Notes Kerlix and Coban from base of toes to 3cm below knee. Electronic Signature(s) Signed: 07/14/2016 8:02:19 AM By: Elliot Gurney, RN, BSN, Kim RN, BSN Entered  By: Elliot Gurney, RN, BSN, Kim on 07/13/2016 10:29:49

## 2016-07-18 ENCOUNTER — Encounter: Payer: Medicare Other | Admitting: Surgery

## 2016-07-18 DIAGNOSIS — S81812A Laceration without foreign body, left lower leg, initial encounter: Secondary | ICD-10-CM | POA: Diagnosis not present

## 2016-07-19 NOTE — Progress Notes (Signed)
ADRINNE, SZE ([id number]) Visit Report for 07/18/2016 Chief Complaint Document Details Patient Name: LANGLEY, FLATLEY. Date of Service: 07/18/2016 10:45 AM Medical Record Number: [id number] Patient Account Number: [id number] Date of Birth/Sex: 04-10-1930 (82 y.o. Female) Treating RN: Huel Coventry Primary Care Physician: Aram Beecham Other Clinician: Referring Physician: Aram Beecham Treating Physician/Extender: Rudene Re in Treatment: 2 Information Obtained from: Patient Chief Complaint Patient seen for complaints of Non-Healing Wound to the left lower extremity for about a week Electronic Signature(s) Signed: 07/18/2016 11:12:30 AM By: Evlyn Kanner MD, FACS Entered By: Evlyn Kanner on 07/18/2016 11:12:29 [name] ([id number]) -------------------------------------------------------------------------------- HPI Details Patient Name: [name] Date of Service: 07/18/2016 10:45 AM Medical Record Number: [id number] Patient Account Number: [id number] Date of Birth/Sex: 10-04-1930 (82 y.o. Female) Treating RN: Huel Coventry Primary Care Physician: Aram Beecham Other Clinician: Referring Physician: Aram Beecham Treating Physician/Extender: Rudene Re in Treatment: 2 History of Present Illness Location: left lower extremity Quality: Patient reports experiencing an aching pain to affected area(s). Severity: Patient states wound (s) are getting better. Duration: Patient has had the wound for < 1 weeks prior to presenting for treatment Timing: Pain in wound is Intermittent (comes and goes) Context: The wound occurred when the patient tripped and injured her left lower extremity where she had some skin tears. Modifying Factors: Other treatment(s) tried include: has been applying some Neosporin ointment over this Associated Signs and Symptoms: Patient reports having: no bleeding or discharge from the wound. HPI Description: 82 year old patient who is  looking much younger than his stated age comes in with a history of having a laceration to her left lower extremity which she sustained about a week ago. She has several medical comorbidities including degenerative arthritis, scoliosis, history of back surgery, pacemaker placement,AMA positive, ulnar neuropathy and left carpal tunnel syndrome. she is also had sclerotherapy for varicose veins in May 2003. her medications include some prednisone at the present time which she may be coming off soon. She went to the [facility] where they have been dressing her wound and she is hear for review. Electronic Signature(s) Signed: 07/18/2016 11:12:35 AM By: Evlyn Kanner MD, FACS Entered By: Evlyn Kanner on 07/18/2016 11:12:34 [name] ([id number]) -------------------------------------------------------------------------------- Physical Exam Details Patient Name: [name] Date of Service: 07/18/2016 10:45 AM Medical Record Number: [id number] Patient Account Number: [id number] Date of Birth/Sex: 1930-01-21 (82 y.o. Female) Treating RN: Huel Coventry Primary Care Physician: Aram Beecham Other Clinician: Referring Physician: Aram Beecham Treating Physician/Extender: Rudene Re in Treatment: 2 Constitutional . Pulse regular. Respirations normal and unlabored. Afebrile. . Eyes Nonicteric. Reactive to light. Ears, Nose, Mouth, and Throat Lips, teeth, and gums WNL.Marland Kitchen Moist mucosa without lesions. Neck supple and nontender. No palpable supraclavicular or cervical adenopathy. Normal sized without goiter. Respiratory WNL. No retractions.. Cardiovascular Pedal Pulses WNL. No clubbing, cyanosis or edema. Lymphatic No adneopathy. No adenopathy. No adenopathy. Musculoskeletal Adexa without tenderness or enlargement.. Digits and nails w/o clubbing, cyanosis, infection, petechiae, ischemia, or inflammatory conditions.. Integumentary (Hair, Skin) No suspicious lesions. No  crepitus or fluctuance. No peri-wound warmth or erythema. No masses.Marland Kitchen Psychiatric Judgement and insight Intact.. No evidence of depression, anxiety, or agitation.. Notes the skin was washed out with moist saline and some of the epidermis came out easily. No debridement was required today. Electronic Signature(s) Signed: 07/18/2016 11:13:04 AM By: Evlyn Kanner MD, FACS Entered By: Evlyn Kanner on 07/18/2016 11:13:03 [name] ([id number]) -------------------------------------------------------------------------------- Physician Orders Details Patient Name: [name] Date of  Service: 07/18/2016 10:45 AM Medical Record Number: [id number] Patient Account Number: [id number] Date of Birth/Sex: November 02, 1930 (82 y.o. Female) Treating RN: Huel Coventry Primary Care Physician: Aram Beecham Other Clinician: Referring Physician: Aram Beecham Treating Physician/Extender: Rudene Re in Treatment: 2 Verbal / Phone Orders: Yes Clinician: Huel Coventry Read Back and Verified: Yes Diagnosis Coding Wound Cleansing Wound #3 Left,Medial Lower Leg o Clean wound with Normal Saline. Primary Wound Dressing Wound #3 Left,Medial Lower Leg o Prisma Ag Secondary Dressing Wound #3 Left,Medial Lower Leg o ABD pad Dressing Change Frequency Wound #3 Left,Medial Lower Leg o Change dressing every week Follow-up Appointments Wound #3 Left,Medial Lower Leg o Return Appointment in 1 week. Edema Control Wound #3 Left,Medial Lower Leg o Other: - Kerlix and coban from toes to knee Electronic Signature(s) Signed: 07/18/2016 3:30:17 PM By: Evlyn Kanner MD, FACS Signed: 07/18/2016 5:30:41 PM By: Elliot Gurney, RN, BSN, Kim RN, BSN Entered By: Elliot Gurney, RN, BSN, Kim on 07/18/2016 11:07:39 [name] ([id number]) -------------------------------------------------------------------------------- Problem List Details Patient Name: NAYRA, COURY Date of Service: 07/18/2016 10:45 AM Medical  Record Number: [id number] Patient Account Number: [id number] Date of Birth/Sex: [birth year] (82 y.o. Female) Treating RN: Huel Coventry Primary Care Physician: Aram Beecham Other Clinician: Referring Physician: Aram Beecham Treating Physician/Extender: Rudene Re in Treatment: 2 Active Problems ICD-10 Encounter Code Description Active Date Diagnosis S81.812A Laceration without foreign body, left lower leg, initial 07/04/2016 Yes encounter L97.222 Non-pressure chronic ulcer of left calf with fat layer 07/04/2016 Yes exposed Inactive Problems Resolved Problems Electronic Signature(s) Signed: 07/18/2016 11:12:23 AM By: Evlyn Kanner MD, FACS Entered By: Evlyn Kanner on 07/18/2016 11:12:22 [name] ([id number]) -------------------------------------------------------------------------------- Progress Note Details Patient Name: [name] Date of Service: 07/18/2016 10:45 AM Medical Record Number: [id number] Patient Account Number: [id number] Date of Birth/Sex: 10-04-1930 (82 y.o. Female) Treating RN: Huel Coventry Primary Care Physician: Aram Beecham Other Clinician: Referring Physician: Aram Beecham Treating Physician/Extender: Rudene Re in Treatment: 2 Subjective Chief Complaint Information obtained from Patient Patient seen for complaints of Non-Healing Wound to the left lower extremity for about a week History of Present Illness (HPI) The following HPI elements were documented for the patient's wound: Location: left lower extremity Quality: Patient reports experiencing an aching pain to affected area(s). Severity: Patient states wound (s) are getting better. Duration: Patient has had the wound for < 1 weeks prior to presenting for treatment Timing: Pain in wound is Intermittent (comes and goes) Context: The wound occurred when the patient tripped and injured her left lower extremity where she had some skin tears. Modifying Factors: Other  treatment(s) tried include: has been applying some Neosporin ointment over this Associated Signs and Symptoms: Patient reports having: no bleeding or discharge from the wound. 82 year old patient who is looking much younger than his stated age comes in with a history of having a laceration to her left lower extremity which she sustained about a week ago. She has several medical comorbidities including degenerative arthritis, scoliosis, history of back surgery, pacemaker placement,AMA positive, ulnar neuropathy and left carpal tunnel syndrome. she is also had sclerotherapy for varicose veins in May 2003. her medications include some prednisone at the present time which she may be coming off soon. She went to the [facility] where they have been dressing her wound and she is hear for review. Objective Constitutional Pulse regular. Respirations normal and unlabored. Afebrile. Vitals Time Taken: 10:54 AM, Height: 60 in, Weight: 126 lbs, BMI: 24.6, Temperature: 98.6 F, Pulse: 66 bpm, Respiratory Rate:  18 breaths/min, Blood Pressure: 148/56 mmHg. CARALINE, DEUTSCHMAN VMarland Kitchen ([id number]) Eyes Nonicteric. Reactive to light. Ears, Nose, Mouth, and Throat Lips, teeth, and gums WNL.Marland Kitchen Moist mucosa without lesions. Neck supple and nontender. No palpable supraclavicular or cervical adenopathy. Normal sized without goiter. Respiratory WNL. No retractions.. Cardiovascular Pedal Pulses WNL. No clubbing, cyanosis or edema. Lymphatic No adneopathy. No adenopathy. No adenopathy. Musculoskeletal Adexa without tenderness or enlargement.. Digits and nails w/o clubbing, cyanosis, infection, petechiae, ischemia, or inflammatory conditions.Marland Kitchen Psychiatric Judgement and insight Intact.. No evidence of depression, anxiety, or agitation.. General Notes: the skin was washed out with moist saline and some of the epidermis came out easily. No debridement was required today. Integumentary (Hair, Skin) No suspicious  lesions. No crepitus or fluctuance. No peri-wound warmth or erythema. No masses.. Wound #3 status is Open. Original cause of wound was Trauma. The wound is located on the Left,Medial Lower Leg. The wound measures 5.2cm length x 7cm width x 0.1cm depth; 28.588cm^2 area and 2.859cm^3 volume. The wound is limited to skin breakdown. There is a medium amount of serous drainage noted. The wound margin is flat and intact. There is no granulation within the wound bed. There is no necrotic tissue within the wound bed. The periwound skin appearance exhibited: Moist, Ecchymosis. The periwound skin appearance did not exhibit: Callus, Crepitus, Excoriation, Fluctuance, Friable, Induration, Localized Edema, Rash, Scarring, Dry/Scaly, Maceration, Atrophie Blanche, Cyanosis, Hemosiderin Staining, Mottled, Pallor, Rubor, Erythema. Periwound temperature was noted as No Abnormality. The periwound has tenderness on palpation. Assessment Active Problems ICD-10 AFREEN, SIEBELS ([id number]) [phone] - Laceration without foreign body, left lower leg, initial encounter L97.222 - Non-pressure chronic ulcer of left calf with fat layer exposed Plan Wound Cleansing: Wound #3 Left,Medial Lower Leg: Clean wound with Normal Saline. Primary Wound Dressing: Wound #3 Left,Medial Lower Leg: Prisma Ag Secondary Dressing: Wound #3 Left,Medial Lower Leg: ABD pad Dressing Change Frequency: Wound #3 Left,Medial Lower Leg: Change dressing every week Follow-up Appointments: Wound #3 Left,Medial Lower Leg: Return Appointment in 1 week. Edema Control: Wound #3 Left,Medial Lower Leg: Other: - Kerlix and coban from toes to knee I recommended a piece of Prisma AG on the wound and foam over this. We will wrap her leg with Kerlix and Coban and leave it on for a week. All her questions have been answered. Electronic Signature(s) Signed: 07/18/2016 11:13:28 AM By: Evlyn Kanner MD, FACS Entered By: Evlyn Kanner on 07/18/2016  11:13:28 [name] ([id number]) -------------------------------------------------------------------------------- SuperBill Details Patient Name: [name] Date of Service: 07/18/2016 Medical Record Number: [id number] Patient Account Number: [id number] Date of Birth/Sex: June 07, 1930 (82 y.o. Female) Treating RN: Huel Coventry Primary Care Physician: Aram Beecham Other Clinician: Referring Physician: Aram Beecham Treating Physician/Extender: Rudene Re in Treatment: 2 Diagnosis Coding ICD-10 Codes Code Description [phone] Laceration without foreign body, left lower leg, initial encounter L97.222 Non-pressure chronic ulcer of left calf with fat layer exposed Physician Procedures CPT4 Code Description: [id number] - WC PHYS LEVEL 3 - EST PT ICD-10 Description Diagnosis S81.812A Laceration without foreign body, left lower leg, ini L97.222 Non-pressure chronic ulcer of left calf with fat lay Modifier: tial encounter er exposed Quantity: 1 Electronic Signature(s) Signed: 07/18/2016 11:13:41 AM By: Evlyn Kanner MD, FACS Entered By: Evlyn Kanner on 07/18/2016 11:13:41

## 2016-07-19 NOTE — Progress Notes (Signed)
[name], [name] ([id number]) Visit Report for 07/18/2016 Arrival Information Details Patient Name: [name]. Date of Service: 07/18/2016 10:45 AM Medical Record Number: [id number] Patient Account Number: [id number] Date of Birth/Sex: Oct 16, 1930 (82 y.o. Female) Treating RN: Huel Coventry Primary Care Physician: Aram Beecham Other Clinician: Referring Physician: Aram Beecham Treating Physician/Extender: Rudene Re in Treatment: 2 Visit Information History Since Last Visit Added or deleted any medications: No Patient Arrived: [place] Any new allergies or adverse reactions: No Arrival Time: 10:48 Had a fall or experienced change in No Accompanied By: self activities of daily living that may affect Transfer Assistance: None risk of falls: Patient Identification Verified: Yes Signs or symptoms of abuse/neglect since last No Secondary Verification Process Completed: Yes visito Patient Requires Transmission-Based No Hospitalized since last visit: No Precautions: Has Dressing in Place as Prescribed: Yes Patient Has Alerts: No Pain Present Now: No Electronic Signature(s) Signed: 07/18/2016 5:30:41 PM By: Elliot Gurney, RN, BSN, Kim RN, BSN Entered By: Elliot Gurney, RN, BSN, Kim on 07/18/2016 10:54:13 [name] ([id number]) -------------------------------------------------------------------------------- Clinic Level of Care Assessment Details Patient Name: [name] Date of Service: 07/18/2016 10:45 AM Medical Record Number: [id number] Patient Account Number: [id number] Date of Birth/Sex: 1930-04-06 (82 y.o. Female) Treating RN: Huel Coventry Primary Care Physician: Aram Beecham Other Clinician: Referring Physician: Aram Beecham Treating Physician/Extender: Rudene Re in Treatment: 2 Clinic Level of Care Assessment Items TOOL 1 Quantity Score []  - Use when EandM and Procedure is performed on INITIAL visit 0 ASSESSMENTS - Nursing Assessment / Reassessment X -  General Physical Exam (combine w/ comprehensive assessment (listed just 1 20 below) when performed on new pt. evals) X - Comprehensive Assessment (HX, ROS, Risk Assessments, Wounds Hx, etc.) 1 25 ASSESSMENTS - Wound and Skin Assessment / Reassessment []  - Dermatologic / Skin Assessment (not related to wound area) 0 ASSESSMENTS - Ostomy and/or Continence Assessment and Care []  - Incontinence Assessment and Management 0 []  - Ostomy Care Assessment and Management (repouching, etc.) 0 PROCESS - Coordination of Care X - Simple Patient / Family Education for ongoing care 1 15 []  - Complex (extensive) Patient / Family Education for ongoing care 0 X - Staff obtains Chiropractor, Records, Test Results / Process Orders 1 10 []  - Staff telephones HHA, Nursing Homes / Clarify orders / etc 0 []  - Routine Transfer to another Facility (non-emergent condition) 0 []  - Routine Hospital Admission (non-emergent condition) 0 X - New Admissions / Manufacturing engineer / Ordering NPWT, Apligraf, etc. 1 15 []  - Emergency Hospital Admission (emergent condition) 0 PROCESS - Special Needs []  - Pediatric / Minor Patient Management 0 []  - Isolation Patient Management 0 [name], [name] ([id number]) []  - Hearing / Language / Visual special needs 0 []  - Assessment of Community assistance (transportation, D/C planning, etc.) 0 []  - Additional assistance / Altered mentation 0 []  - Support Surface(s) Assessment (bed, cushion, seat, etc.) 0 INTERVENTIONS - Miscellaneous []  - External ear exam 0 []  - Patient Transfer (multiple staff / Nurse, adult / Similar devices) 0 []  - Simple Staple / Suture removal (25 or less) 0 []  - Complex Staple / Suture removal (26 or more) 0 []  - Hypo/Hyperglycemic Management (do not check if billed separately) 0 []  - Ankle / Brachial Index (ABI) - do not check if billed separately 0 Has the patient been seen at the hospital within the last three years: Yes Total Score: 85 Level Of Care:  New/Established - Level 3 Electronic Signature(s) Signed: 07/18/2016 5:30:41 PM By:  Elliot Gurney, RN, BSN, Radio producer, BSN Entered By: Elliot Gurney, RN, BSN, Kim on 07/18/2016 11:20:08 [name] ([id number]) -------------------------------------------------------------------------------- Encounter Discharge Information Details Patient Name: [name], [name] Date of Service: 07/18/2016 10:45 AM Medical Record Number: [id number] Patient Account Number: [id number] Date of Birth/Sex: 02-16-1930 (82 y.o. Female) Treating RN: Huel Coventry Primary Care Physician: Aram Beecham Other Clinician: Referring Physician: Aram Beecham Treating Physician/Extender: Rudene Re in Treatment: 2 Encounter Discharge Information Items Discharge Pain Level: 0 Discharge Condition: Stable Ambulatory Status: Cane Discharge Destination: Home Transportation: Private Auto Accompanied By: self Schedule Follow-up Appointment: Yes Medication Reconciliation completed Yes and provided to Patient/Care Jannine Abreu: Provided on Clinical Summary of Care: 07/18/2016 Form Type Recipient Paper Patient [facility] Electronic Signature(s) Signed: 07/18/2016 5:30:41 PM By: Elliot Gurney RN, BSN, Kim RN, BSN Previous Signature: 07/18/2016 11:16:25 AM Version By: Gwenlyn Perking Entered By: Elliot Gurney RN, BSN, Kim on 07/18/2016 11:22:13 [name] ([id number]) -------------------------------------------------------------------------------- Lower Extremity Assessment Details Patient Name: [name] Date of Service: 07/18/2016 10:45 AM Medical Record Number: [id number] Patient Account Number: [id number] Date of Birth/Sex: 02-Mar-1930 (82 y.o. Female) Treating RN: Huel Coventry Primary Care Physician: Aram Beecham Other Clinician: Referring Physician: Aram Beecham Treating Physician/Extender: Rudene Re in Treatment: 2 Edema Assessment Assessed: [Left: No] [Right: No] E[Left: dema] [Right: :] Calf Left: Right: Point of  Measurement: 24 cm From Medial Instep 30 cm cm Ankle Left: Right: Point of Measurement: 10 cm From Medial Instep 19.4 cm cm Vascular Assessment Pulses: Posterior Tibial Palpable: [Left:Yes] Dorsalis Pedis Palpable: [Left:Yes] Extremity colors, hair growth, and conditions: Extremity Color: [Left:Hyperpigmented] Hair Growth on Extremity: [Left:Yes] Temperature of Extremity: [Left:Warm] Capillary Refill: [Left:< 3 seconds] Toe Nail Assessment Left: Right: Thick: No Discolored: No Deformed: No Improper Length and Hygiene: No Electronic Signature(s) Signed: 07/18/2016 5:30:41 PM By: Elliot Gurney, RN, BSN, Kim RN, BSN Entered By: Elliot Gurney, RN, BSN, Kim on 07/18/2016 10:57:39 [name] ([id number]) [name], [name] ([id number]) -------------------------------------------------------------------------------- Multi Wound Chart Details Patient Name: [name] Date of Service: 07/18/2016 10:45 AM Medical Record Number: [id number] Patient Account Number: [id number] Date of Birth/Sex: 27-Apr-1930 (82 y.o. Female) Treating RN: Huel Coventry Primary Care Physician: Aram Beecham Other Clinician: Referring Physician: Aram Beecham Treating Physician/Extender: Rudene Re in Treatment: 2 Vital Signs Height(in): 60 Pulse(bpm): 66 Weight(lbs): 126 Blood Pressure 148/56 (mmHg): Body Mass Index(BMI): 25 Temperature(F): 98.6 Respiratory Rate 18 (breaths/min): Photos: [N/A:N/A] Wound Location: Left Lower Leg - Medial N/A N/A Wounding Event: Trauma N/A N/A Primary Etiology: Trauma, Other N/A N/A Comorbid History: Hypertension, Gout, N/A N/A Osteoarthritis, Neuropathy Date Acquired: 06/27/2016 N/A N/A Weeks of Treatment: 2 N/A N/A Wound Status: Open N/A N/A Clustered Wound: Yes N/A N/A Measurements L x W x D 5.2x7x0.1 N/A N/A (cm) Area (cm) : 28.588 N/A N/A Volume (cm) : 2.859 N/A N/A % Reduction in Area: 24.20% N/A N/A % Reduction in Volume: 24.20% N/A  N/A Classification: Partial Thickness N/A N/A Exudate Amount: Medium N/A N/A Exudate Type: Serous N/A N/A Exudate Color: amber N/A N/A Wound Margin: Flat and Intact N/A N/A Granulation Amount: None Present (0%) N/A N/A Necrotic Amount: None Present (0%) N/A N/A [name], [name] ([id number]) Exposed Structures: Fascia: No N/A N/A Fat: No Tendon: No Muscle: No Joint: No Bone: No Limited to Skin Breakdown Epithelialization: None N/A N/A Periwound Skin Texture: Edema: No N/A N/A Excoriation: No Induration: No Callus: No Crepitus: No Fluctuance: No Friable: No Rash: No Scarring: No Periwound Skin Moist: Yes N/A N/A Moisture: Maceration: No Dry/Scaly: No Periwound Skin  Color: Ecchymosis: Yes N/A N/A Atrophie Blanche: No Cyanosis: No Erythema: No Hemosiderin Staining: No Mottled: No Pallor: No Rubor: No Temperature: No Abnormality N/A N/A Tenderness on Yes N/A N/A Palpation: Wound Preparation: Ulcer Cleansing: N/A N/A Rinsed/Irrigated with Saline Topical Anesthetic Applied: Other: lidocaine 4% Treatment Notes Electronic Signature(s) Signed: 07/18/2016 5:30:41 PM By: Elliot Gurney, RN, BSN, Kim RN, BSN Entered By: Elliot Gurney, RN, BSN, Kim on 07/18/2016 11:00:30 [name] ([id number]) -------------------------------------------------------------------------------- Multi-Disciplinary Care Plan Details Patient Name: [name], [name] Date of Service: 07/18/2016 10:45 AM Medical Record Number: [id number] Patient Account Number: [id number] Date of Birth/Sex: [birth year] (82 y.o. Female) Treating RN: Huel Coventry Primary Care Physician: Aram Beecham Other Clinician: Referring Physician: Aram Beecham Treating Physician/Extender: Rudene Re in Treatment: 2 Active Inactive Abuse / Safety / Falls / Self Care Management Nursing Diagnoses: Potential for falls Goals: Patient will remain injury free Date Initiated: 07/04/2016 Goal Status: Active Interventions: Assess  fall risk on admission and as needed Notes: Soft Tissue Infection Nursing Diagnoses: Impaired tissue integrity Potential for infection: soft tissue Goals: Patient will remain free of wound infection Date Initiated: 07/04/2016 Goal Status: Active Interventions: Assess signs and symptoms of infection every visit Notes: Wound/Skin Impairment Nursing Diagnoses: Impaired tissue integrity Goals: Ulcer/skin breakdown will have a volume reduction of 30% by week 4 [name], [name] ([id number]) Date Initiated: 07/04/2016 Goal Status: Active Interventions: Assess ulceration(s) every visit Notes: Electronic Signature(s) Signed: 07/18/2016 5:30:41 PM By: Elliot Gurney, RN, BSN, Kim RN, BSN Entered By: Elliot Gurney, RN, BSN, Kim on 07/18/2016 11:00:07 [name] ([id number]) -------------------------------------------------------------------------------- Pain Assessment Details Patient Name: [name] Date of Service: 07/18/2016 10:45 AM Medical Record Number: [id number] Patient Account Number: [id number] Date of Birth/Sex: 02/12/1930 (82 y.o. Female) Treating RN: Huel Coventry Primary Care Physician: Aram Beecham Other Clinician: Referring Physician: Aram Beecham Treating Physician/Extender: Rudene Re in Treatment: 2 Active Problems Location of Pain Severity and Description of Pain Patient Has Paino No Site Locations Pain Management and Medication Current Pain Management: Electronic Signature(s) Signed: 07/18/2016 5:30:41 PM By: Elliot Gurney, RN, BSN, Kim RN, BSN Entered By: Elliot Gurney, RN, BSN, Kim on 07/18/2016 10:54:19 [name] ([id number]) -------------------------------------------------------------------------------- Patient/Caregiver Education Details Patient Name: [name] Date of Service: 07/18/2016 10:45 AM Medical Record Number: [id number] Patient Account Number: [id number] Date of Birth/Gender: 06-21-1930 (82 y.o. Female) Treating RN: Huel Coventry Primary Care  Physician: Aram Beecham Other Clinician: Referring Physician: Aram Beecham Treating Physician/Extender: Rudene Re in Treatment: 2 Education Assessment Education Provided To: Patient Education Topics Provided Venous: Controlling Swelling with Multilayered Compression Wraps, Other: continue wounbd care as Handouts: prescribed Methods: Demonstration, Explain/Verbal Responses: State content correctly Electronic Signature(s) Signed: 07/18/2016 5:30:41 PM By: Elliot Gurney, RN, BSN, Kim RN, BSN Entered By: Elliot Gurney, RN, BSN, Kim on 07/18/2016 11:22:52 [name] ([id number]) -------------------------------------------------------------------------------- Wound Assessment Details Patient Name: [name] Date of Service: 07/18/2016 10:45 AM Medical Record Number: [id number] Patient Account Number: [id number] Date of Birth/Sex: [birth year] (82 y.o. Female) Treating RN: Huel Coventry Primary Care Physician: Aram Beecham Other Clinician: Referring Physician: Aram Beecham Treating Physician/Extender: Rudene Re in Treatment: 2 Wound Status Wound Number: 3 Primary Trauma, Other Etiology: Wound Location: Left Lower Leg - Medial Wound Status: Open Wounding Event: Trauma Comorbid Hypertension, Gout, Osteoarthritis, Date Acquired: 06/27/2016 History: Neuropathy Weeks Of Treatment: 2 Clustered Wound: Yes Photos Wound Measurements Length: (cm) 5.2 Width: (cm) 7 Depth: (cm) 0.1 Area: (cm) 28.588 Volume: (cm) 2.859 % Reduction in Area: 24.2% % Reduction in Volume: 24.2% Epithelialization: None  Wound Description Classification: Partial Thickness Wound Margin: Flat and Intact Exudate Amount: Medium Exudate Type: Serous Exudate Color: amber Foul Odor After Cleansing: No Wound Bed Granulation Amount: None Present (0%) Exposed Structure Necrotic Amount: None Present (0%) Fascia Exposed: No Fat Layer Exposed: No Tendon Exposed: No Muscle Exposed:  No [name], [name] ([id number]) Joint Exposed: No Bone Exposed: No Limited to Skin Breakdown Periwound Skin Texture Texture Color No Abnormalities Noted: No No Abnormalities Noted: No Callus: No Atrophie Blanche: No Crepitus: No Cyanosis: No Excoriation: No Ecchymosis: Yes Fluctuance: No Erythema: No Friable: No Hemosiderin Staining: No Induration: No Mottled: No Localized Edema: No Pallor: No Rash: No Rubor: No Scarring: No Temperature / Pain Moisture Temperature: No Abnormality No Abnormalities Noted: No Tenderness on Palpation: Yes Dry / Scaly: No Maceration: No Moist: Yes Wound Preparation Ulcer Cleansing: Rinsed/Irrigated with Saline Topical Anesthetic Applied: None Treatment Notes Wound #3 (Left, Medial Lower Leg) 1. Cleansed with: Clean wound with Normal Saline 4. Dressing Applied: Prisma Ag 5. Secondary Dressing Applied ABD Pad 7. Secured with Tape Other (specify in notes) Notes Kerlix and Coban from base of toes to 3cm below knee. Electronic Signature(s) Signed: 07/18/2016 5:30:41 PM By: Elliot Gurney, RN, BSN, Kim RN, BSN Entered By: Elliot Gurney, RN, BSN, Kim on 07/18/2016 11:01:06 [name] ([id number]) -------------------------------------------------------------------------------- Vitals Details Patient Name: [name] Date of Service: 07/18/2016 10:45 AM Medical Record Number: [id number] Patient Account Number: [id number] Date of Birth/Sex: [birth year] (82 y.o. Female) Treating RN: Huel Coventry Primary Care Physician: Aram Beecham Other Clinician: Referring Physician: Aram Beecham Treating Physician/Extender: Rudene Re in Treatment: 2 Vital Signs Time Taken: 10:54 Temperature (F): 98.6 Height (in): 60 Pulse (bpm): 66 Weight (lbs): 126 Respiratory Rate (breaths/min): 18 Body Mass Index (BMI): 24.6 Blood Pressure (mmHg): 148/56 Reference Range: 80 - 120 mg / dl Electronic Signature(s) Signed: 07/18/2016 5:30:41 PM By: Elliot Gurney,  RN, BSN, Kim RN, BSN Entered By: Elliot Gurney, RN, BSN, Kim on 07/18/2016 10:54:39

## 2016-07-25 ENCOUNTER — Encounter: Payer: Medicare Other | Admitting: Surgery

## 2016-07-25 DIAGNOSIS — S81812A Laceration without foreign body, left lower leg, initial encounter: Secondary | ICD-10-CM | POA: Diagnosis not present

## 2016-07-29 NOTE — Progress Notes (Signed)
[name], [name] ([id number]) Visit Report for 07/25/2016 Arrival Information Details Patient Name: [name], [name] Date of Service: 07/25/2016 2:30 PM Medical Record Number: [id number] Patient Account Number: [id number] Date of Birth/Sex: 21-Jul-1930 (82 y.o. Female) Treating RN: Huel Coventry Primary Care Physician: Aram Beecham Other Clinician: Referring Physician: Aram Beecham Treating Physician/Extender: Rudene Re in Treatment: 3 Visit Information History Since Last Visit Added or deleted any medications: No Patient Arrived: Ambulatory Any new allergies or adverse reactions: No Arrival Time: 14:27 Had a fall or experienced change in No Accompanied By: self activities of daily living that may affect Transfer Assistance: None risk of falls: Patient Identification Verified: Yes Signs or symptoms of abuse/neglect since last No Secondary Verification Process Yes visito Completed: Hospitalized since last visit: No Patient Requires Transmission-Based No Has Dressing in Place as Prescribed: Yes Precautions: Pain Present Now: No Patient Has Alerts: No Electronic Signature(s) Signed: 07/28/2016 5:06:33 PM By: Elliot Gurney, RN, BSN, Kim RN, BSN Entered By: Elliot Gurney, RN, BSN, Kim on 07/25/2016 14:29:17 [name] ([id number]) -------------------------------------------------------------------------------- Encounter Discharge Information Details Patient Name: [name] Date of Service: 07/25/2016 2:30 PM Medical Record Number: [id number] Patient Account Number: [id number] Date of Birth/Sex: 03/10/1930 (82 y.o. Female) Treating RN: Huel Coventry Primary Care Physician: Aram Beecham Other Clinician: Referring Physician: Aram Beecham Treating Physician/Extender: Rudene Re in Treatment: 3 Encounter Discharge Information Items Discharge Pain Level: 2 Discharge Condition: Stable Ambulatory Status: Cane Discharge Destination: Home Transportation: Private  Auto Accompanied By: self Schedule Follow-up Appointment: Yes Medication Reconciliation completed No and provided to Patient/Care Tawnie Ehresman: Provided on Clinical Summary of Care: 07/25/2016 Form Type Recipient Paper Patient [facility] Electronic Signature(s) Signed: 07/28/2016 5:06:33 PM By: Elliot Gurney RN, BSN, Kim RN, BSN Previous Signature: 07/25/2016 3:05:58 PM Version By: Gwenlyn Perking Entered By: Elliot Gurney RN, BSN, Kim on 07/25/2016 15:06:49 [name] ([id number]) -------------------------------------------------------------------------------- Lower Extremity Assessment Details Patient Name: [name] Date of Service: 07/25/2016 2:30 PM Medical Record Number: [id number] Patient Account Number: [id number] Date of Birth/Sex: 1930/05/23 (82 y.o. Female) Treating RN: Huel Coventry Primary Care Physician: Aram Beecham Other Clinician: Referring Physician: Aram Beecham Treating Physician/Extender: Rudene Re in Treatment: 3 Edema Assessment Assessed: [Left: No] [Right: No] E[Left: dema] [Right: :] Calf Left: Right: Point of Measurement: 24 cm From Medial Instep 30 cm cm Ankle Left: Right: Point of Measurement: 10 cm From Medial Instep 19.4 cm cm Vascular Assessment Pulses: Posterior Tibial Dorsalis Pedis Palpable: [Left:Yes] Extremity colors, hair growth, and conditions: Extremity Color: [Left:Hyperpigmented] Temperature of Extremity: [Left:Cool] Capillary Refill: [Left:< 3 seconds] Toe Nail Assessment Left: Right: Thick: No Discolored: No Deformed: No Improper Length and Hygiene: No Electronic Signature(s) Signed: 07/28/2016 5:06:33 PM By: Elliot Gurney, RN, BSN, Kim RN, BSN Entered By: Elliot Gurney, RN, BSN, Kim on 07/25/2016 14:41:32 [name] ([id number]) -------------------------------------------------------------------------------- Multi Wound Chart Details Patient Name: [name] Date of Service: 07/25/2016 2:30 PM Medical Record Number:  [id number] Patient Account Number: [id number] Date of Birth/Sex: Feb 29, 1930 (82 y.o. Female) Treating RN: Huel Coventry Primary Care Physician: Aram Beecham Other Clinician: Referring Physician: Aram Beecham Treating Physician/Extender: Rudene Re in Treatment: 3 Vital Signs Height(in): 60 Pulse(bpm): 68 Weight(lbs): 126 Blood Pressure 119/42 (mmHg): Body Mass Index(BMI): 25 Temperature(F): 98.2 Respiratory Rate 18 (breaths/min): Photos: [N/A:N/A] Wound Location: Left Lower Leg - Medial N/A N/A Wounding Event: Trauma N/A N/A Primary Etiology: Trauma, Other N/A N/A Comorbid History: Hypertension, Gout, N/A N/A Osteoarthritis, Neuropathy Date Acquired: 06/27/2016 N/A N/A Weeks of Treatment: 3 N/A N/A Wound Status: Open  N/A N/A Clustered Wound: Yes N/A N/A Measurements L x W x D 5x6x0.1 N/A N/A (cm) Area (cm) : 23.562 N/A N/A Volume (cm) : 2.356 N/A N/A % Reduction in Area: 37.50% N/A N/A % Reduction in Volume: 37.50% N/A N/A Classification: Partial Thickness N/A N/A Exudate Amount: Medium N/A N/A Exudate Type: Serous N/A N/A Exudate Color: amber N/A N/A Wound Margin: Flat and Intact N/A N/A Granulation Amount: None Present (0%) N/A N/A Necrotic Amount: None Present (0%) N/A N/A [name], [name] ([id number]) Exposed Structures: Fascia: No N/A N/A Fat: No Tendon: No Muscle: No Joint: No Bone: No Limited to Skin Breakdown Epithelialization: None N/A N/A Periwound Skin Texture: Edema: No N/A N/A Excoriation: No Induration: No Callus: No Crepitus: No Fluctuance: No Friable: No Rash: No Scarring: No Periwound Skin Moist: Yes N/A N/A Moisture: Maceration: No Dry/Scaly: No Periwound Skin Color: Ecchymosis: Yes N/A N/A Atrophie Blanche: No Cyanosis: No Erythema: No Hemosiderin Staining: No Mottled: No Pallor: No Rubor: No Temperature: No Abnormality N/A N/A Tenderness on Yes N/A N/A Palpation: Wound Preparation: Ulcer Cleansing: N/A  N/A Rinsed/Irrigated with Saline Topical Anesthetic Applied: None Treatment Notes Electronic Signature(s) Signed: 07/28/2016 5:06:33 PM By: Elliot Gurney, RN, BSN, Kim RN, BSN Entered By: Elliot Gurney, RN, BSN, Kim on 07/25/2016 14:43:31 [name] ([id number]) -------------------------------------------------------------------------------- Multi-Disciplinary Care Plan Details Patient Name: [name] Date of Service: 07/25/2016 2:30 PM Medical Record Number: [id number] Patient Account Number: [id number] Date of Birth/Sex: April 04, 1930 (82 y.o. Female) Treating RN: Huel Coventry Primary Care Physician: Aram Beecham Other Clinician: Referring Physician: Aram Beecham Treating Physician/Extender: Rudene Re in Treatment: 3 Active Inactive Abuse / Safety / Falls / Self Care Management Nursing Diagnoses: Potential for falls Goals: Patient will remain injury free Date Initiated: 07/04/2016 Goal Status: Active Interventions: Assess fall risk on admission and as needed Notes: Soft Tissue Infection Nursing Diagnoses: Impaired tissue integrity Potential for infection: soft tissue Goals: Patient will remain free of wound infection Date Initiated: 07/04/2016 Goal Status: Active Interventions: Assess signs and symptoms of infection every visit Notes: Wound/Skin Impairment Nursing Diagnoses: Impaired tissue integrity Goals: Ulcer/skin breakdown will have a volume reduction of 30% by week 4 [name], [name] ([id number]) Date Initiated: 07/04/2016 Goal Status: Active Interventions: Assess ulceration(s) every visit Notes: Electronic Signature(s) Signed: 07/28/2016 5:06:33 PM By: Elliot Gurney, RN, BSN, Kim RN, BSN Entered By: Elliot Gurney, RN, BSN, Kim on 07/25/2016 14:43:22 [name] ([id number]) -------------------------------------------------------------------------------- Pain Assessment Details Patient Name: [name] Date of Service: 07/25/2016 2:30 PM Medical Record  Number: [id number] Patient Account Number: [id number] Date of Birth/Sex: September 27, 1930 (82 y.o. Female) Treating RN: Huel Coventry Primary Care Physician: Aram Beecham Other Clinician: Referring Physician: Aram Beecham Treating Physician/Extender: Rudene Re in Treatment: 3 Active Problems Location of Pain Severity and Description of Pain Patient Has Paino No Site Locations With Dressing Change: No Pain Management and Medication Current Pain Management: Electronic Signature(s) Signed: 07/28/2016 5:06:33 PM By: Elliot Gurney, RN, BSN, Kim RN, BSN Entered By: Elliot Gurney, RN, BSN, Kim on 07/25/2016 14:29:27 [name] ([id number]) -------------------------------------------------------------------------------- Patient/Caregiver Education Details Patient Name: [name] Date of Service: 07/25/2016 2:30 PM Medical Record Number: [id number] Patient Account Number: [id number] Date of Birth/Gender: 1930/08/09 (82 y.o. Female) Treating RN: Huel Coventry Primary Care Physician: Aram Beecham Other Clinician: Referring Physician: Aram Beecham Treating Physician/Extender: Rudene Re in Treatment: 3 Education Assessment Education Provided To: Patient Education Topics Provided Wound/Skin Impairment: Handouts: Caring for Your Ulcer, Other: wound care as prescribed Methods: Demonstration, Explain/Verbal Responses: State content  correctly Electronic Signature(s) Signed: 07/28/2016 5:06:33 PM By: Elliot Gurney, RN, BSN, Kim RN, BSN Entered By: Elliot Gurney, RN, BSN, Kim on 07/25/2016 15:07:10 [name] ([id number]) -------------------------------------------------------------------------------- Wound Assessment Details Patient Name: [name], [name] Date of Service: 07/25/2016 2:30 PM Medical Record Number: [id number] Patient Account Number: [id number] Date of Birth/Sex: 1930-10-21 (82 y.o. Female) Treating RN: Huel Coventry Primary Care Physician: Aram Beecham Other  Clinician: Referring Physician: Aram Beecham Treating Physician/Extender: Rudene Re in Treatment: 3 Wound Status Wound Number: 3 Primary Trauma, Other Etiology: Wound Location: Left Lower Leg - Medial Wound Status: Open Wounding Event: Trauma Comorbid Hypertension, Gout, Osteoarthritis, Date Acquired: 06/27/2016 History: Neuropathy Weeks Of Treatment: 3 Clustered Wound: Yes Photos Wound Measurements Length: (cm) 5 Width: (cm) 6 Depth: (cm) 0.1 Area: (cm) 23.562 Volume: (cm) 2.356 % Reduction in Area: 37.5% % Reduction in Volume: 37.5% Epithelialization: None Tunneling: No Undermining: No Wound Description Classification: Partial Thickness Wound Margin: Flat and Intact Exudate Amount: Medium Exudate Type: Serous Exudate Color: amber Foul Odor After Cleansing: No Wound Bed Granulation Amount: None Present (0%) Exposed Structure Necrotic Amount: None Present (0%) Fascia Exposed: No Fat Layer Exposed: No Tendon Exposed: No Muscle Exposed: No [name], [name] ([id number]) Joint Exposed: No Bone Exposed: No Limited to Skin Breakdown Periwound Skin Texture Texture Color No Abnormalities Noted: No No Abnormalities Noted: No Callus: No Atrophie Blanche: No Crepitus: No Cyanosis: No Excoriation: No Ecchymosis: Yes Fluctuance: No Erythema: No Friable: No Hemosiderin Staining: No Induration: No Mottled: No Localized Edema: No Pallor: No Rash: No Rubor: No Scarring: No Temperature / Pain Moisture Temperature: No Abnormality No Abnormalities Noted: No Tenderness on Palpation: Yes Dry / Scaly: No Maceration: No Moist: Yes Wound Preparation Ulcer Cleansing: Rinsed/Irrigated with Saline Topical Anesthetic Applied: None Treatment Notes Wound #3 (Left, Medial Lower Leg) 1. Cleansed with: Clean wound with Normal Saline 2. Anesthetic Topical Lidocaine 4% cream to wound bed prior to debridement 4. Dressing Applied: Mepitel 5. Secondary  Dressing Applied Foam 7. Secured with Other (specify in notes) Notes Kerlix and Coban from base of toes to 3cm below knee. Electronic Signature(s) Signed: 07/28/2016 5:06:33 PM By: Elliot Gurney, RN, BSN, Kim RN, BSN Entered By: Elliot Gurney, RN, BSN, Kim on 07/25/2016 14:42:18 [name] ([id number]) -------------------------------------------------------------------------------- Vitals Details Patient Name: [name] Date of Service: 07/25/2016 2:30 PM Medical Record Number: [id number] Patient Account Number: [id number] Date of Birth/Sex: Nov 01, 1930 (82 y.o. Female) Treating RN: Huel Coventry Primary Care Physician: Aram Beecham Other Clinician: Referring Physician: Aram Beecham Treating Physician/Extender: Rudene Re in Treatment: 3 Vital Signs Time Taken: 14:29 Temperature (F): 98.2 Height (in): 60 Pulse (bpm): 68 Weight (lbs): 126 Respiratory Rate (breaths/min): 18 Body Mass Index (BMI): 24.6 Blood Pressure (mmHg): 119/42 Reference Range: 80 - 120 mg / dl Electronic Signature(s) Signed: 07/28/2016 5:06:33 PM By: Elliot Gurney, RN, BSN, Kim RN, BSN Entered By: Elliot Gurney, RN, BSN, Kim on 07/25/2016 14:29:48

## 2016-07-29 NOTE — Progress Notes (Signed)
[name], [name] ([id number]) Visit Report for 07/25/2016 Chief Complaint Document Details Patient Name: [name], PEELER. Date of Service: 07/25/2016 2:30 PM Medical Record Number: [id number] Patient Account Number: [id number] Date of Birth/Sex: [birth year] (82 y.o. Female) Treating RN: Huel Coventry Primary Care Physician: Aram Beecham Other Clinician: Referring Physician: Aram Beecham Treating Physician/Extender: Rudene Re in Treatment: 3 Information Obtained from: Patient Chief Complaint Patient seen for complaints of Non-Healing Wound to the left lower extremity for about a week Electronic Signature(s) Signed: 07/25/2016 3:00:33 PM By: Evlyn Kanner MD, FACS Entered By: Evlyn Kanner on 07/25/2016 15:00:32 [name] ([id number]) -------------------------------------------------------------------------------- Debridement Details Patient Name: [name] Date of Service: 07/25/2016 2:30 PM Medical Record Number: [id number] Patient Account Number: [id number] Date of Birth/Sex: [birth year] (82 y.o. Female) Treating RN: Huel Coventry Primary Care Physician: Aram Beecham Other Clinician: Referring Physician: Aram Beecham Treating Physician/Extender: Rudene Re in Treatment: 3 Debridement Performed for Wound #3 Left,Medial Lower Leg Assessment: Performed By: Physician Evlyn Kanner, MD Debridement: Debridement Pre-procedure Yes Verification/Time Out Taken: Start Time: 14:45 Pain Control: Other : licocaine 4% Level: Skin/Subcutaneous Tissue Total Area Debrided (L x 3 (cm) x 6 (cm) = 18 (cm) W): Tissue and other Viable, Non-Viable, Eschar, Exudate, Fibrin/Slough, Skin, Subcutaneous material debrided: Instrument: Forceps Bleeding: Minimum Hemostasis Achieved: Pressure End Time: 14:55 Procedural Pain: 0 Post Procedural Pain: 0 Response to Treatment: Procedure was tolerated well Post Debridement Measurements of Total Wound Length: (cm)  5 Width: (cm) 6 Depth: (cm) 0.1 Volume: (cm) 2.356 Post Procedure Diagnosis Same as Pre-procedure Electronic Signature(s) Signed: 07/25/2016 2:58:45 PM By: Evlyn Kanner MD, FACS Signed: 07/28/2016 5:06:33 PM By: Elliot Gurney RN, BSN, Kim RN, BSN Entered By: Evlyn Kanner on 07/25/2016 14:58:45 [name] ([id number]) -------------------------------------------------------------------------------- HPI Details Patient Name: [name] Date of Service: 07/25/2016 2:30 PM Medical Record Number: [id number] Patient Account Number: [id number] Date of Birth/Sex: 1930/07/21 (82 y.o. Female) Treating RN: Huel Coventry Primary Care Physician: Aram Beecham Other Clinician: Referring Physician: Aram Beecham Treating Physician/Extender: Rudene Re in Treatment: 3 History of Present Illness Location: left lower extremity Quality: Patient reports experiencing an aching pain to affected area(s). Severity: Patient states wound (s) are getting better. Duration: Patient has had the wound for < 1 weeks prior to presenting for treatment Timing: Pain in wound is Intermittent (comes and goes) Context: The wound occurred when the patient tripped and injured her left lower extremity where she had some skin tears. Modifying Factors: Other treatment(s) tried include: has been applying some Neosporin ointment over this Associated Signs and Symptoms: Patient reports having: no bleeding or discharge from the wound. HPI Description: 82 year old patient who is looking much younger than his stated age comes in with a history of having a laceration to her left lower extremity which she sustained about a week ago. She has several medical comorbidities including degenerative arthritis, scoliosis, history of back surgery, pacemaker placement,AMA positive, ulnar neuropathy and left carpal tunnel syndrome. she is also had sclerotherapy for varicose veins in May 2003. her medications include some  prednisone at the present time which she may be coming off soon. She went to the [facility] where they have been dressing her wound and she is hear for review. Electronic Signature(s) Signed: 07/25/2016 3:00:39 PM By: Evlyn Kanner MD, FACS Entered By: Evlyn Kanner on 07/25/2016 15:00:39 [name] ([id number]) -------------------------------------------------------------------------------- Physical Exam Details Patient Name: [name] Date of Service: 07/25/2016 2:30 PM Medical Record Number: [id number] Patient Account Number: [id number] Date of  Birth/Sex: 1930/04/28 (82 y.o. Female) Treating RN: Huel Coventry Primary Care Physician: Aram Beecham Other Clinician: Referring Physician: Aram Beecham Treating Physician/Extender: Rudene Re in Treatment: 3 Constitutional . Pulse regular. Respirations normal and unlabored. Afebrile. . Eyes Nonicteric. Reactive to light. Ears, Nose, Mouth, and Throat Lips, teeth, and gums WNL.Marland Kitchen Moist mucosa without lesions. Neck supple and nontender. No palpable supraclavicular or cervical adenopathy. Normal sized without goiter. Respiratory WNL. No retractions.. Cardiovascular Pedal Pulses WNL. No clubbing, cyanosis or edema. Lymphatic No adneopathy. No adenopathy. No adenopathy. Musculoskeletal Adexa without tenderness or enlargement.. Digits and nails w/o clubbing, cyanosis, infection, petechiae, ischemia, or inflammatory conditions.. Integumentary (Hair, Skin) No suspicious lesions. No crepitus or fluctuance. No peri-wound warmth or erythema. No masses.Marland Kitchen Psychiatric Judgement and insight Intact.. No evidence of depression, anxiety, or agitation.. Notes there was a lot of subcutaneous debris and adherent dressing material which I had to sharply removed with a forcep and scissors. Minimal bleeding controlled with pressure. Electronic Signature(s) Signed: 07/25/2016 3:02:48 PM By: Evlyn Kanner MD, FACS Entered By:  Evlyn Kanner on 07/25/2016 15:02:47 [name] ([id number]) -------------------------------------------------------------------------------- Physician Orders Details Patient Name: [name] Date of Service: 07/25/2016 2:30 PM Medical Record Number: [id number] Patient Account Number: [id number] Date of Birth/Sex: 1930-06-07 (82 y.o. Female) Treating RN: Huel Coventry Primary Care Physician: Aram Beecham Other Clinician: Referring Physician: Aram Beecham Treating Physician/Extender: Rudene Re in Treatment: 3 Verbal / Phone Orders: Yes Clinician: Huel Coventry Read Back and Verified: Yes Diagnosis Coding Wound Cleansing Wound #3 Left,Medial Lower Leg o Clean wound with Normal Saline. Primary Wound Dressing Wound #3 Left,Medial Lower Leg o Mepitel One Secondary Dressing Wound #3 Left,Medial Lower Leg o Foam Dressing Change Frequency Wound #3 Left,Medial Lower Leg o Change dressing every week Follow-up Appointments Wound #3 Left,Medial Lower Leg o Return Appointment in 1 week. Edema Control Wound #3 Left,Medial Lower Leg o Other: - Kerlix and coban from toes to knee Electronic Signature(s) Signed: 07/25/2016 4:23:27 PM By: Evlyn Kanner MD, FACS Signed: 07/28/2016 5:06:33 PM By: Elliot Gurney, RN, BSN, Kim RN, BSN Entered By: Elliot Gurney, RN, BSN, Kim on 07/25/2016 14:55:47 [name] ([id number]) -------------------------------------------------------------------------------- Problem List Details Patient Name: [name], [name] Date of Service: 07/25/2016 2:30 PM Medical Record Number: [id number] Patient Account Number: [id number] Date of Birth/Sex: 12/15/1929 (82 y.o. 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 Female) Treating RN: Huel Coventry Primary Care Physician: Aram Beecham Other Clinician: Referring Physician: Aram Beecham Treating Physician/Extender: Rudene Re in Treatment: 3 Subjective Chief Complaint Information obtained from Patient Patient seen for complaints of Non-Healing Wound to the left lower extremity for about a week History of Present Illness (HPI) The following HPI elements were documented for the patient's wound: Location: left lower extremity Quality: Patient reports experiencing an aching pain to affected area(s). Severity: Patient states wound (s) are getting better. Duration: Patient has had the  wound for < 1 weeks prior to presenting for treatment Timing: Pain in wound is Intermittent (comes and goes) Context: The wound occurred when the patient tripped and injured her left lower extremity where she had some skin tears. Modifying Factors: Other treatment(s) tried include: has been applying some Neosporin ointment over this Associated Signs and Symptoms: Patient reports having: no bleeding or discharge from the wound. 82 year old patient who is looking much younger than his stated age comes in with a history of having a laceration to her left lower extremity which she sustained about a week ago. She has several medical comorbidities  including degenerative arthritis, scoliosis, history of back surgery, pacemaker placement,AMA positive, ulnar neuropathy and left carpal tunnel syndrome. she is also had sclerotherapy for varicose veins in May 2003. her medications include some prednisone at the present time which she may be coming off soon. She went to the [facility] where they have been dressing her wound and she is hear for review. Objective Constitutional Pulse regular. Respirations normal and unlabored. Afebrile. Vitals Time Taken: 2:29 PM, Height: 60 in, Weight: 126 lbs, BMI: 24.6, Temperature: 98.2 F, Pulse: 68 bpm, Respiratory Rate: 18 breaths/min, Blood Pressure: 119/42 mmHg. [name], [name] VMarland Kitchen ([id number]) Eyes Nonicteric. Reactive to light. Ears, Nose, Mouth, and Throat Lips, teeth, and gums WNL.Marland Kitchen Moist mucosa without lesions. Neck supple and nontender. No palpable supraclavicular or cervical adenopathy. Normal sized without goiter. Respiratory WNL. No retractions.. Cardiovascular Pedal Pulses WNL. No clubbing, cyanosis or edema. Lymphatic No adneopathy. No adenopathy. No adenopathy. Musculoskeletal Adexa without tenderness or enlargement.. Digits and nails w/o clubbing, cyanosis, infection, petechiae, ischemia, or inflammatory conditions.Marland Kitchen Psychiatric Judgement and insight Intact.. No evidence of depression, anxiety, or agitation.. General Notes: there was a lot of subcutaneous debris and adherent dressing material which I had to sharply removed with a forcep and scissors. Minimal bleeding controlled with pressure. Integumentary (Hair, Skin) No suspicious lesions. No crepitus or fluctuance. No peri-wound warmth or erythema. No masses.. Wound #3 status is Open. Original cause of wound was Trauma. The wound is located on the Left,Medial Lower Leg. The wound measures 5cm length x 6cm width x 0.1cm depth; 23.562cm^2 area and 2.356cm^3 volume. The wound is limited to skin breakdown. There is no tunneling  or undermining noted. There is a medium amount of serous drainage noted. The wound margin is flat and intact. There is no granulation within the wound bed. There is no necrotic tissue within the wound bed. The periwound skin appearance exhibited: Moist, Ecchymosis. The periwound skin appearance did not exhibit: Callus, Crepitus, Excoriation, Fluctuance, Friable, Induration, Localized Edema, Rash, Scarring, Dry/Scaly, Maceration, Atrophie Blanche, Cyanosis, Hemosiderin Staining, Mottled, Pallor, Rubor, Erythema. Periwound temperature was noted as No Abnormality. The periwound has tenderness on palpation. Assessment Active Problems ICD-10 [name], [name] ([id number]) [phone] - Laceration without foreign body, left lower leg, initial encounter L97.222 - Non-pressure chronic ulcer of left calf with fat layer exposed Procedures Wound #3 Wound #3 is a Trauma, Other located on the Left,Medial Lower Leg . There was a Skin/Subcutaneous Tissue Debridement ([id number]) debridement with total area of 18 sq cm performed by Evlyn Kanner, MD. with the following instrument(s): Forceps to remove Viable and Non-Viable tissue/material including Exudate, Fibrin/Slough, Eschar, Skin, and Subcutaneous after achieving pain control using Other (licocaine 4%). A time out was conducted prior to the start of the procedure. A Minimum amount of bleeding was controlled with Pressure. The procedure was tolerated well with a pain level of 0 throughout and a pain level  of 0 following the procedure. Post Debridement Measurements: 5cm length x 6cm width x 0.1cm depth; 2.356cm^3 volume. Post procedure Diagnosis Wound #3: Same as Pre-Procedure Plan Wound Cleansing: Wound #3 Left,Medial Lower Leg: Clean wound with Normal Saline. Primary Wound Dressing: Wound #3 Left,Medial Lower Leg: Mepitel One Secondary Dressing: Wound #3 Left,Medial Lower Leg: Foam Dressing Change Frequency: Wound #3 Left,Medial Lower  Leg: Change dressing every week Follow-up Appointments: Wound #3 Left,Medial Lower Leg: Return Appointment in 1 week. Edema Control: Wound #3 Left,Medial Lower Leg: Other: - Kerlix and coban from toes to knee [name], [name] ([id number]) Her edema has increased a bit and I have recommended elevation and exercise. we'll use a piece of Mepitel over this wound and then some foam and use Kerlix and Coban as before. She will come back and see me next week and hopefully it'll be healing a bit better. Electronic Signature(s) Signed: 07/25/2016 3:04:02 PM By: Evlyn Kanner MD, FACS Entered By: Evlyn Kanner on 07/25/2016 15:04:02 [name] ([id number]) -------------------------------------------------------------------------------- SuperBill Details Patient Name: [name] Date of Service: 07/25/2016 Medical Record Number: [id number] Patient Account Number: [id number] Date of Birth/Sex: 1930/11/07 (82 y.o. 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

## 2016-08-01 ENCOUNTER — Encounter: Payer: Medicare Other | Admitting: Surgery

## 2016-08-01 DIAGNOSIS — S81812A Laceration without foreign body, left lower leg, initial encounter: Secondary | ICD-10-CM | POA: Diagnosis not present

## 2016-08-02 NOTE — Progress Notes (Signed)
EDOM, SCHMUHL ([id number]) Visit Report for 08/01/2016 Arrival Information Details Patient Name: [name], [name]. Date of Service: 08/01/2016 12:45 PM Medical Record Number: [id number] Patient Account Number: [id number] Date of Birth/Sex: 1930/06/17 (82 y.o. Female) Treating RN: Georgana Curio Primary Care Physician: Aram Beecham Other Clinician: Referring Physician: Aram Beecham Treating Physician/Extender: Rudene Re in Treatment: 4 Visit Information History Since Last Visit All ordered tests and consults were completed: No Patient Arrived: Gilmer Mor Added or deleted any medications: No Arrival Time: 12:49 Any new allergies or adverse reactions: No Accompanied By: self Had a fall or experienced change in No Transfer Assistance: None activities of daily living that may affect Patient Identification Verified: Yes risk of falls: Secondary Verification Process Completed: Yes Signs or symptoms of abuse/neglect since last No Patient Requires Transmission-Based No visito Precautions: Hospitalized since last visit: No Patient Has Alerts: No Has Dressing in Place as Prescribed: Yes Has Compression in Place as Prescribed: Yes Pain Present Now: No Electronic Signature(s) Signed: 08/01/2016 4:58:52 PM By: Georgana Curio Entered By: Georgana Curio on 08/01/2016 12:55:35 [name] ([id number]) -------------------------------------------------------------------------------- Clinic Level of Care Assessment Details Patient Name: [name] Date of Service: 08/01/2016 12:45 PM Medical Record Number: [id number] Patient Account Number: [id number] Date of Birth/Sex: [birth year] (82 y.o. Female) Treating RN: Georgana Curio Primary Care Physician: Aram Beecham Other Clinician: Referring Physician: Aram Beecham Treating Physician/Extender: Rudene Re in Treatment: 4 Clinic Level of Care Assessment Items TOOL 4 Quantity Score X - Use when only an EandM is  performed on FOLLOW-UP visit 1 0 ASSESSMENTS - Nursing Assessment / Reassessment X - Reassessment of Co-morbidities (includes updates in patient status) 1 10 X - Reassessment of Adherence to Treatment Plan 1 5 ASSESSMENTS - Wound and Skin Assessment / Reassessment X - Simple Wound Assessment / Reassessment - one wound 1 5 []  - Complex Wound Assessment / Reassessment - multiple wounds 0 []  - Dermatologic / Skin Assessment (not related to wound area) 0 ASSESSMENTS - Focused Assessment []  - Circumferential Edema Measurements - multi extremities 0 []  - Nutritional Assessment / Counseling / Intervention 0 X - Lower Extremity Assessment (monofilament, tuning fork, pulses) 1 5 []  - Peripheral Arterial Disease Assessment (using hand held doppler) 0 ASSESSMENTS - Ostomy and/or Continence Assessment and Care []  - Incontinence Assessment and Management 0 []  - Ostomy Care Assessment and Management (repouching, etc.) 0 PROCESS - Coordination of Care []  - Simple Patient / Family Education for ongoing care 0 []  - Complex (extensive) Patient / Family Education for ongoing care 0 X - Staff obtains , Records, Test Results / Process Orders 1 10 []  - Staff telephones HHA, Nursing Homes / Clarify orders / etc 0 []  - Routine Transfer to another Facility (non-emergent condition) 0 [name], [name] ( ) []  - Routine Hospital Admission (non-emergent condition) 0 []  - New Admissions / / Ordering NPWT, Apligraf, etc. 0 []  - Emergency Hospital Admission (emergent condition) 0 X - Simple Discharge Coordination 1 10 []  - Complex (extensive) Discharge Coordination 0 PROCESS - Special Needs []  - Pediatric / Minor Patient Management 0 []  - Isolation Patient Management 0 []  - Hearing / Language / Visual special needs 0 []  - Assessment of Community assistance (transportation, D/C planning, etc.) 0 []  - Additional assistance / Altered mentation 0 []  - Support Surface(s)  Assessment (bed, cushion, seat, etc.) 0 INTERVENTIONS - Wound Cleansing / Measurement X - Simple Wound Cleansing - one wound 1 5 []  - Complex Wound Cleansing - multiple  wounds 0 []  - Wound Imaging (photographs - any number of wounds) 0 []  - Wound Tracing (instead of photographs) 0 X - Simple Wound Measurement - one wound 1 5 []  - Complex Wound Measurement - multiple wounds 0 INTERVENTIONS - Wound Dressings X - Small Wound Dressing one or multiple wounds 1 10 []  - Medium Wound Dressing one or multiple wounds 0 []  - Large Wound Dressing one or multiple wounds 0 []  - Application of Medications - topical 0 []  - Application of Medications - injection 0 INTERVENTIONS - Miscellaneous []  - External ear exam 0 [name], [name] ([id number]) []  - Specimen Collection (cultures, biopsies, blood, body fluids, etc.) 0 []  - Specimen(s) / Culture(s) sent or taken to Lab for analysis 0 []  - Patient Transfer (multiple staff / Michiel Sites Lift / Similar devices) 0 []  - Simple Staple / Suture removal (25 or less) 0 []  - Complex Staple / Suture removal (26 or more) 0 []  - Hypo / Hyperglycemic Management (close monitor of Blood Glucose) 0 []  - Ankle / Brachial Index (ABI) - do not check if billed separately 0 X - Vital Signs 1 5 Has the patient been seen at the hospital within the last three years: Yes Total Score: 70 Level Of Care: New/Established - Level 2 Electronic Signature(s) Signed: 08/01/2016 4:58:52 PM By: Georgana Curio Entered By: Georgana Curio on 08/01/2016 13:48:43 [name] ([id number]) -------------------------------------------------------------------------------- Encounter Discharge Information Details Patient Name: [name] Date of Service: 08/01/2016 12:45 PM Medical Record Number: [id number] Patient Account Number: [id number] Date of Birth/Sex: 05/15/1930 (82 y.o. Female) Treating RN: Georgana Curio Primary Care Physician: Aram Beecham Other Clinician: Referring Physician:  Aram Beecham Treating Physician/Extender: Rudene Re in Treatment: 4 Encounter Discharge Information Items Discharge Pain Level: 0 Discharge Condition: Stable Ambulatory Status: Cane Discharge Destination: Home Private Transportation: Auto Accompanied By: self Schedule Follow-up Appointment: Yes Medication Reconciliation completed and Yes provided to Patient/Care Atarah Cadogan: Patient Clinical Summary of Care: Declined Electronic Signature(s) Signed: 08/01/2016 4:58:52 PM By: Georgana Curio Entered By: Georgana Curio on 08/01/2016 13:45:45 [name] ([id number]) -------------------------------------------------------------------------------- Lower Extremity Assessment Details Patient Name: [name] Date of Service: 08/01/2016 12:45 PM Medical Record Number: [id number] Patient Account Number: [id number] Date of Birth/Sex: [birth year] (82 y.o. Female) Treating RN: Georgana Curio Primary Care Physician: Aram Beecham Other Clinician: Referring Physician: Aram Beecham Treating Physician/Extender: Rudene Re in Treatment: 4 Edema Assessment Assessed: [name]: No] [Right: No] E[Left: dema] [Right: :] Calf Left: Right: Point of Measurement: cm From Medial Instep 33.3 cm cm Ankle Left: Right: Point of Measurement: cm From Medial Instep 19.8 cm cm Vascular Assessment Pulses: Posterior Tibial Dorsalis Pedis Palpable: [Left:Yes] Extremity colors, hair growth, and conditions: Extremity Color: [Left:Hyperpigmented] Hair Growth on Extremity: [Left:No] Temperature of Extremity: [Left:Warm] Capillary Refill: [Left:< 3 seconds] Dependent Rubor: [Left:No] Lipodermatosclerosis: [Left:No] Toe Nail Assessment Left: Right: Thick: Yes Discolored: No Deformed: No Improper Length and Hygiene: No Electronic Signature(s) Signed: 08/01/2016 4:58:52 PM By: Georgana Curio Entered By: Georgana Curio on 08/01/2016 13:04:34 [name] ([id number]) [name],  [name] ([id number]) -------------------------------------------------------------------------------- Multi Wound Chart Details Patient Name: [name] Date of Service: 08/01/2016 12:45 PM Medical Record Number: [id number] Patient Account Number: [id number] Date of Birth/Sex: [birth year] (82 y.o. Female) Treating RN: Georgana Curio Primary Care Physician: Aram Beecham Other Clinician: Referring Physician: Aram Beecham Treating Physician/Extender: Rudene Re in Treatment: 4 Vital Signs Height(in): 60 Pulse(bpm): 62 Weight(lbs): 126 Blood Pressure 107/39 (mmHg): Body Mass Index(BMI): 25 Temperature(F): 97.9 Respiratory Rate  16 (breaths/min): Photos: [3:No Photos] [N/A:N/A] Wound Location: [3:Left Lower Leg - Medial N/A] Wounding Event: [3:Trauma] [N/A:N/A] Primary Etiology: [3:Trauma, Other] [N/A:N/A] Comorbid History: [3:Hypertension, Gout, Osteoarthritis, Neuropathy] [N/A:N/A] Date Acquired: [3:06/27/2016] [N/A:N/A] Weeks of Treatment: [3:4] [N/A:N/A] Wound Status: [3:Open] [N/A:N/A] Clustered Wound: [3:Yes] [N/A:N/A] Measurements L x W x D 5.2x5.8x0.1 [N/A:N/A] (cm) Area (cm) : [3:23.688] [N/A:N/A] Volume (cm) : [3:2.369] [N/A:N/A] % Reduction in Area: [3:37.20%] [N/A:N/A] % Reduction in Volume: 37.20% [N/A:N/A] Classification: [3:Partial Thickness] [N/A:N/A] Exudate Amount: [3:Medium] [N/A:N/A] Exudate Type: [3:Serous] [N/A:N/A] Exudate Color: [3:amber] [N/A:N/A] Wound Margin: [3:Flat and Intact] [N/A:N/A] Granulation Amount: [3:Large (67-100%)] [N/A:N/A] Necrotic Amount: [3:Small (1-33%)] [N/A:N/A] Exposed Structures: [3:Fascia: No Fat: No Tendon: No Muscle: No Joint: No Bone: No] [N/A:N/A] Limited to Skin Breakdown Epithelialization: Small (1-33%) N/A N/A Periwound Skin Texture: Edema: No N/A N/A Excoriation: No Induration: No Callus: No Crepitus: No Fluctuance: No Friable: No Rash: No Scarring: No Periwound Skin Moist: Yes N/A  N/A Moisture: Maceration: No Dry/Scaly: No Periwound Skin Color: Ecchymosis: Yes N/A N/A Atrophie Blanche: No Cyanosis: No Erythema: No Hemosiderin Staining: No Mottled: No Pallor: No Rubor: No Temperature: No Abnormality N/A N/A Tenderness on Yes N/A N/A Palpation: Wound Preparation: Ulcer Cleansing: N/A N/A Rinsed/Irrigated with Saline Topical Anesthetic Applied: Other: lidocaine 4% Treatment Notes Electronic Signature(s) Signed: 08/01/2016 4:58:52 PM By: Georgana Curio Entered By: Georgana Curio on 08/01/2016 13:23:59 [name] ([id number]) -------------------------------------------------------------------------------- Multi-Disciplinary Care Plan Details Patient Name: [name] Date of Service: 08/01/2016 12:45 PM Medical Record Number: [id number] Patient Account Number: [id number] Date of Birth/Sex: [birth year] (82 y.o. Female) Treating RN: Georgana Curio Primary Care Physician: Aram Beecham Other Clinician: Referring Physician: Aram Beecham Treating Physician/Extender: Rudene Re in Treatment: 4 Active Inactive Abuse / Safety / Falls / Self Care Management Nursing Diagnoses: Potential for falls Goals: Patient will remain injury free Date Initiated: 07/04/2016 Goal Status: Active Interventions: Assess fall risk on admission and as needed Notes: Soft Tissue Infection Nursing Diagnoses: Impaired tissue integrity Potential for infection: soft tissue Goals: Patient will remain free of wound infection Date Initiated: 07/04/2016 Goal Status: Active Interventions: Assess signs and symptoms of infection every visit Notes: Wound/Skin Impairment Nursing Diagnoses: Impaired tissue integrity Goals: Ulcer/skin breakdown will have a volume reduction of 30% by week 4 [name], [name] ([id number]) Date Initiated: 07/04/2016 Goal Status: Active Interventions: Assess ulceration(s) every visit Notes: Electronic Signature(s) Signed: 08/01/2016  4:58:52 PM By: Georgana Curio Entered By: Georgana Curio on 08/01/2016 13:15:48 [name] ([id number]) -------------------------------------------------------------------------------- Pain Assessment Details Patient Name: [name] Date of Service: 08/01/2016 12:45 PM Medical Record Number: [id number] Patient Account Number: [id number] Date of Birth/Sex: [birth year] (82 y.o. Female) Treating RN: Georgana Curio Primary Care Physician: Aram Beecham Other Clinician: Referring Physician: Aram Beecham Treating Physician/Extender: Rudene Re in Treatment: 4 Active Problems Location of Pain Severity and Description of Pain Patient Has Paino No Site Locations Pain Management and Medication Current Pain Management: Electronic Signature(s) Signed: 08/01/2016 4:58:52 PM By: Georgana Curio Entered By: Georgana Curio on 08/01/2016 13:00:42 [name] ([id number]) -------------------------------------------------------------------------------- Patient/Caregiver Education Details Patient Name: [name] Date of Service: 08/01/2016 12:45 PM Medical Record Number: [id number] Patient Account Number: [id number] Date of Birth/Gender: January 26, 1930 (82 y.o. Female) Treating RN: Georgana Curio Primary Care Physician: Aram Beecham Other Clinician: Referring Physician: Aram Beecham Treating Physician/Extender: Rudene Re in Treatment: 4 Education Assessment Education Provided To: Patient Education Topics Provided Wound/Skin Impairment: Methods: Explain/Verbal Responses: State content correctly Electronic Signature(s) Signed: 08/01/2016 4:58:52 PM By: Eliberto Ivory,  Betsy Entered By: Georgana Curio on 08/01/2016 13:46:10 [name] ([id number]) -------------------------------------------------------------------------------- Wound Assessment Details Patient Name: [name], [name] Date of Service: 08/01/2016 12:45 PM Medical Record Number: [id number] Patient  Account Number: [id number] Date of Birth/Sex: 1930-03-22 (82 y.o. Female) Treating RN: Georgana Curio Primary Care Physician: Aram Beecham Other Clinician: Referring Physician: Aram Beecham Treating Physician/Extender: Rudene Re in Treatment: 4 Wound Status Wound Number: 3 Primary Trauma, Other Etiology: Wound Location: Left Lower Leg - Medial Wound Status: Open Wounding Event: Trauma Comorbid Hypertension, Gout, Osteoarthritis, Date Acquired: 06/27/2016 History: Neuropathy Weeks Of Treatment: 4 Clustered Wound: Yes Wound Measurements Length: (cm) 5.2 Width: (cm) 5.8 Depth: (cm) 0.1 Area: (cm) 23.688 Volume: (cm) 2.369 % Reduction in Area: 37.2% % Reduction in Volume: 37.2% Epithelialization: Small (1-33%) Tunneling: No Undermining: No Wound Description Classification: Partial Thickness Foul Odor Af Wound Margin: Flat and Intact Exudate Amount: Medium Exudate Type: Serous Exudate Color: amber ter Cleansing: No Wound Bed Granulation Amount: Large (67-100%) Exposed Structure Necrotic Amount: Small (1-33%) Fascia Exposed: No Fat Layer Exposed: No Tendon Exposed: No Muscle Exposed: No Joint Exposed: No Bone Exposed: No Limited to Skin Breakdown Periwound Skin Texture Texture Color No Abnormalities Noted: No No Abnormalities Noted: No Callus: No Atrophie Blanche: No Crepitus: No Cyanosis: No Excoriation: No Ecchymosis: Yes Fluctuance: No Erythema: No [name], [name] ([id number]) Friable: No Hemosiderin Staining: No Induration: No Mottled: No Localized Edema: No Pallor: No Rash: No Rubor: No Scarring: No Temperature / Pain Moisture Temperature: No Abnormality No Abnormalities Noted: No Tenderness on Palpation: Yes Dry / Scaly: No Maceration: No Moist: Yes Wound Preparation Ulcer Cleansing: Rinsed/Irrigated with Saline Topical Anesthetic Applied: Other: lidocaine 4%, Electronic Signature(s) Signed: 08/01/2016 4:58:52 PM By:  Georgana Curio Entered By: Georgana Curio on 08/01/2016 13:10:24 [name] ([id number]) -------------------------------------------------------------------------------- Vitals Details Patient Name: [name] Date of Service: 08/01/2016 12:45 PM Medical Record Number: [id number] Patient Account Number: [id number] Date of Birth/Sex: [birth year] (82 y.o. Female) Treating RN: Georgana Curio Primary Care Physician: Aram Beecham Other Clinician: Referring Physician: Aram Beecham Treating Physician/Extender: Rudene Re in Treatment: 4 Vital Signs Time Taken: 01:00 Temperature (F): 97.9 Height (in): 60 Pulse (bpm): 62 Weight (lbs): 126 Respiratory Rate (breaths/min): 16 Body Mass Index (BMI): 24.6 Blood Pressure (mmHg): 107/39 Reference Range: 80 - 120 mg / dl Electronic Signature(s) Signed: 08/01/2016 4:58:52 PM By: Georgana Curio Entered By: Georgana Curio on 08/01/2016 13:02:19

## 2016-08-02 NOTE — Progress Notes (Signed)
GABRIALLE, DIVITO ([id number]) Visit Report for 08/01/2016 Chief Complaint Document Details Patient Name: MECHEL, PATRAS 08/01/2016 12:45 Date of Service: PM Medical Record [id number] Number: Patient Account Number: [id number] 1930-11-30 (82 y.o. Treating RN: Georgana Curio Date of Birth/Sex: Female) Other Clinician: Primary Care Physician: Aram Beecham Treating Evlyn Kanner Referring Physician: Aram Beecham Physician/Extender: Tania Ade in Treatment: 4 Information Obtained from: Patient Chief Complaint Patient seen for complaints of Non-Healing Wound to the left lower extremity for about a week Electronic Signature(s) Signed: 08/01/2016 1:28:38 PM By: Evlyn Kanner MD, FACS Entered By: Evlyn Kanner on 08/01/2016 13:28:38 [name] ([id number]) -------------------------------------------------------------------------------- HPI Details Patient Name: THERASA, KORNBLUTH 08/01/2016 12:45 Date of Service: PM Medical Record [id number] Number: Patient Account Number: [id number] 1930-07-03 (82 y.o. Treating RN: Georgana Curio Date of Birth/Sex: Female) Other Clinician: Primary Care Physician: Aram Beecham Treating Evlyn Kanner Referring Physician: Aram Beecham Physician/Extender: Tania Ade in Treatment: 4 History of Present Illness Location: left lower extremity Quality: Patient reports experiencing an aching pain to affected area(s). Severity: Patient states wound (s) are getting better. Duration: Patient has had the wound for < 1 weeks prior to presenting for treatment Timing: Pain in wound is Intermittent (comes and goes) Context: The wound occurred when the patient tripped and injured her left lower extremity where she had some skin tears. Modifying Factors: Other treatment(s) tried include: has been applying some Neosporin ointment over this Associated Signs and Symptoms: Patient reports having: no bleeding or discharge from the wound. HPI Description: 82 year old  patient who is looking much younger than his stated age comes in with a history of having a laceration to her left lower extremity which she sustained about a week ago. She has several medical comorbidities including degenerative arthritis, scoliosis, history of back surgery, pacemaker placement,AMA positive, ulnar neuropathy and left carpal tunnel syndrome. she is also had sclerotherapy for varicose veins in May 2003. her medications include some prednisone at the present time which she may be coming off soon. She went to the [facility] [place] [facility] where they have been dressing her wound and she is hear for review. Electronic Signature(s) Signed: 08/01/2016 1:28:44 PM By: Evlyn Kanner MD, FACS Entered By: Evlyn Kanner on 08/01/2016 13:28:44 [name] ([id number]) -------------------------------------------------------------------------------- Physical Exam Details Patient Name: MARYALICE, AYOTTE 08/01/2016 12:45 Date of Service: PM Medical Record [id number] Number: Patient Account Number: [id number] 10-May-1930 (82 y.o. Treating RN: Georgana Curio Date of Birth/Sex: Female) Other Clinician: Primary Care Physician: Aram Beecham Treating Evlyn Kanner Referring Physician: Aram Beecham Physician/Extender: Tania Ade in Treatment: 4 Constitutional . Pulse regular. Respirations normal and unlabored. Afebrile. . Eyes Nonicteric. Reactive to light. Ears, Nose, Mouth, and Throat Lips, teeth, and gums WNL.Marland Kitchen Moist mucosa without lesions. Neck supple and nontender. No palpable supraclavicular or cervical adenopathy. Normal sized without goiter. Respiratory WNL. No retractions.. Cardiovascular Pedal Pulses WNL. No clubbing, cyanosis or edema. Lymphatic No adneopathy. No adenopathy. No adenopathy. Musculoskeletal Adexa without tenderness or enlargement.. Digits and nails w/o clubbing, cyanosis, infection, petechiae, ischemia, or inflammatory conditions.. Integumentary (Hair, Skin) No  suspicious lesions. No crepitus or fluctuance. No peri-wound warmth or erythema. No masses.Marland Kitchen Psychiatric Judgement and insight Intact.. No evidence of depression, anxiety, or agitation.. Notes the wound looks much better today with no adherence of dressing material and the subcutaneous tissue looks clean and it was washed out with moist saline gauze. Electronic Signature(s) Signed: 08/01/2016 1:29:23 PM By: Evlyn Kanner MD, FACS Entered By: Evlyn Kanner on 08/01/2016 13:29:22 [name] ([id number]) -------------------------------------------------------------------------------- Physician Orders Details Patient  Name: [name], Randy V. 08/01/2016 12:45 Date of Service: PM Medical Record [id number] Number: Patient Account Number: [id number] 08/30/1930 (82 y.o. Treating RN: Georgana CurioAustin, Betsy Date of Birth/Sex: Female) Other Clinician: Primary Care Physician: Aram BeechamSparks, Jeffrey Treating Evlyn KannerBritto, Sanaai Doane Referring Physician: Aram BeechamSparks, Jeffrey Physician/Extender: Tania AdeWeeks in Treatment: 4 Verbal / Phone Orders: No Diagnosis Coding Wound Cleansing Wound #3 Left,Medial Lower Leg o Clean wound with Normal Saline. Primary Wound Dressing Wound #3 Left,Medial Lower Leg o Mepitel One Secondary Dressing Wound #3 Left,Medial Lower Leg o Foam Dressing Change Frequency Wound #3 Left,Medial Lower Leg o Change dressing every week Follow-up Appointments Wound #3 Left,Medial Lower Leg o Return Appointment in 1 week. Edema Control Wound #3 Left,Medial Lower Leg o Other: - Kerlix and coban from toes to knee Electronic Signature(s) Signed: 08/01/2016 4:08:58 PM By: Evlyn KannerBritto, Aulani Shipton MD, FACS Signed: 08/01/2016 4:58:52 PM By: Georgana CurioAustin, Betsy Entered By: Georgana CurioAustin, Betsy on 08/01/2016 13:26:02 [name], Samari V. ([id number]) -------------------------------------------------------------------------------- Problem List Details Patient Name: [name], Micheale V. 08/01/2016 12:45 Date of Service: PM Medical  Record [id number] Number: Patient Account Number: [id number] 12/14/1929 (82 y.o. Treating RN: Georgana CurioAustin, Betsy Date of Birth/Sex: Female) Other Clinician: Primary Care Physician: Aram BeechamSparks, Jeffrey Treating Evlyn KannerBritto, Taquita Demby Referring Physician: Aram BeechamSparks, Jeffrey Physician/Extender: Tania AdeWeeks in Treatment: 4 Active Problems ICD-10 Encounter Code Description Active Date Diagnosis S81.812A Laceration without foreign body, left lower leg, initial 07/04/2016 Yes encounter L97.222 Non-pressure chronic ulcer of left calf with fat layer 07/04/2016 Yes exposed Inactive Problems Resolved Problems Electronic Signature(s) Signed: 08/01/2016 1:28:31 PM By: Evlyn KannerBritto, Romi Rathel MD, FACS Entered By: Evlyn KannerBritto, Awais Cobarrubias on 08/01/2016 13:28:31 [name], Mckaylie V. ([id number]) -------------------------------------------------------------------------------- Progress Note Details Patient Name: [name], Banessa V. 08/01/2016 12:45 Date of Service: PM Medical Record [id number] Number: Patient Account Number: [id number] 02/16/1930 (82 y.o. Treating RN: Georgana CurioAustin, Betsy Date of Birth/Sex: Female) Other Clinician: Primary Care Physician: Aram BeechamSparks, Jeffrey Treating Evlyn KannerBritto, Annette Liotta Referring Physician: Aram BeechamSparks, Jeffrey Physician/Extender: Tania AdeWeeks in Treatment: 4 Subjective Chief Complaint Information obtained from Patient Patient seen for complaints of Non-Healing Wound to the left lower extremity for about a week History of Present Illness (HPI) The following HPI elements were documented for the patient's wound: Location: left lower extremity Quality: Patient reports experiencing an aching pain to affected area(s). Severity: Patient states wound (s) are getting better. Duration: Patient has had the wound for < 1 weeks prior to presenting for treatment Timing: Pain in wound is Intermittent (comes and goes) Context: The wound occurred when the patient tripped and injured her left lower extremity where she had some skin tears. Modifying  Factors: Other treatment(s) tried include: has been applying some Neosporin ointment over this Associated Signs and Symptoms: Patient reports having: no bleeding or discharge from the wound. 82 year old patient who is looking much younger than his stated age comes in with a history of having a laceration to her left lower extremity which she sustained about a week ago. She has several medical comorbidities including degenerative arthritis, scoliosis, history of back surgery, pacemaker placement,AMA positive, ulnar neuropathy and left carpal tunnel syndrome. she is also had sclerotherapy for varicose veins in May 2003. her medications include some prednisone at the present time which she may be coming off soon. [facility] to [facility] where they have been dressing her wound and she is hear for review. Objective Constitutional Pulse regular. Respirations normal and unlabored. Afebrile. Vitals Time Taken: 1:00 AM, Height: 60 in, Weight: 126 lbs, BMI: 24.6, Temperature: 97.9 F, Pulse: 62 bpm, Respiratory Rate: 16 breaths/min, Blood Pressure: 107/39 mmHg. Cappelli, Dimitria V. (  [id number]) Eyes Nonicteric. Reactive to light. Ears, Nose, Mouth, and Throat Lips, teeth, and gums WNL.Marland Kitchen Moist mucosa without lesions. Neck supple and nontender. No palpable supraclavicular or cervical adenopathy. Normal sized without goiter. Respiratory WNL. No retractions.. Cardiovascular Pedal Pulses WNL. No clubbing, cyanosis or edema. Lymphatic No adneopathy. No adenopathy. No adenopathy. Musculoskeletal Adexa without tenderness or enlargement.. Digits and nails w/o clubbing, cyanosis, infection, petechiae, ischemia, or inflammatory conditions.Marland Kitchen Psychiatric Judgement and insight Intact.. No evidence of depression, anxiety, or agitation.. General Notes: the wound looks much better today with no adherence of dressing material and the subcutaneous tissue looks clean and it was washed out with moist saline  gauze. Integumentary (Hair, Skin) No suspicious lesions. No crepitus or fluctuance. No peri-wound warmth or erythema. No masses.. Wound #3 status is Open. Original cause of wound was Trauma. The wound is located on the Left,Medial Lower Leg. The wound measures 5.2cm length x 5.8cm width x 0.1cm depth; 23.688cm^2 area and 2.369cm^3 volume. The wound is limited to skin breakdown. There is no tunneling or undermining noted. There is a medium amount of serous drainage noted. The wound margin is flat and intact. There is large (67-100%) granulation within the wound bed. There is a small (1-33%) amount of necrotic tissue within the wound bed. The periwound skin appearance exhibited: Moist, Ecchymosis. The periwound skin appearance did not exhibit: Callus, Crepitus, Excoriation, Fluctuance, Friable, Induration, Localized Edema, Rash, Scarring, Dry/Scaly, Maceration, Atrophie Blanche, Cyanosis, Hemosiderin Staining, Mottled, Pallor, Rubor, Erythema. Periwound temperature was noted as No Abnormality. The periwound has tenderness on palpation. Assessment KARLEY, PHO ([id number]) Active Problems ICD-10 [phone] - Laceration without foreign body, left lower leg, initial encounter L97.222 - Non-pressure chronic ulcer of left calf with fat layer exposed Plan Wound Cleansing: Wound #3 Left,Medial Lower Leg: Clean wound with Normal Saline. Primary Wound Dressing: Wound #3 Left,Medial Lower Leg: Mepitel One Secondary Dressing: Wound #3 Left,Medial Lower Leg: Foam Dressing Change Frequency: Wound #3 Left,Medial Lower Leg: Change dressing every week Follow-up Appointments: Wound #3 Left,Medial Lower Leg: Return Appointment in 1 week. Edema Control: Wound #3 Left,Medial Lower Leg: Other: - Kerlix and coban from toes to knee Her edema has increased a bit and I have recommended elevation and exercise. we'll use a piece of Mepitel over this wound and then some foam and use Kerlix and Coban as  before. She will come back and see me next week. Electronic Signature(s) Signed: 08/01/2016 1:30:15 PM By: Evlyn Kanner MD, FACS [name] ([id number]) Entered By: Evlyn Kanner on 08/01/2016 13:30:15 [name] ([id number]) -------------------------------------------------------------------------------- SuperBill Details Patient Name: [name] Date of Service: 08/01/2016 Medical Record Number: [id number] Patient Account Number: [id number] Date of Birth/Sex: 1930-09-27 (82 y.o. Female) Treating RN: Georgana Curio Primary Care Physician: Aram Beecham Other Clinician: Referring Physician: Aram Beecham Treating Physician/Extender: Rudene Re in Treatment: 4 Diagnosis Coding ICD-10 Codes Code Description [phone] Laceration without foreign body, left lower leg, initial encounter L97.222 Non-pressure chronic ulcer of left calf with fat layer exposed Facility Procedures CPT4 Code: [id number] Description: [phone] - WOUND CARE VISIT-LEV 2 EST PT Modifier: Quantity: 1 Physician Procedures CPT4 Code Description: [id number] - WC PHYS LEVEL 3 - EST PT ICD-10 Description Diagnosis S81.812A Laceration without foreign body, left lower leg, ini L97.222 Non-pressure chronic ulcer of left calf with fat lay Modifier: tial encounter er exposed Quantity: 1 Electronic Signature(s) Signed: 08/01/2016 4:08:58 PM By: Evlyn Kanner MD, FACS Signed: 08/01/2016 4:58:52 PM By: Georgana Curio Previous Signature: 08/01/2016 1:30:36 PM Version  By: Evlyn Kanner MD, FACS Entered By: Georgana Curio on 08/01/2016 13:48:59

## 2016-08-08 ENCOUNTER — Encounter: Payer: Medicare Other | Admitting: Surgery

## 2016-08-08 DIAGNOSIS — S81812A Laceration without foreign body, left lower leg, initial encounter: Secondary | ICD-10-CM | POA: Diagnosis not present

## 2016-08-08 NOTE — Progress Notes (Signed)
RAKSHA, WOLFGANG ([id number]) Visit Report for 08/08/2016 Chief Complaint Document Details Patient Name: [name], [name] 08/08/2016 10:45 Date of Service: AM Medical Record [id number] Number: Patient Account Number: [id number] [birth year] (82 y.o. Treating RN: Huel Coventry Date of Birth/Sex: Female) Other Clinician: Primary Care Physician: Aram Beecham Treating Evlyn Kanner Referring Physician: Aram Beecham Physician/Extender: Tania Ade in Treatment: 5 Information Obtained from: Patient Chief Complaint Patient seen for complaints of Non-Healing Wound to the left lower extremity for about a week Electronic Signature(s) Signed: 08/08/2016 11:24:01 AM By: Evlyn Kanner MD, FACS Entered By: Evlyn Kanner on 08/08/2016 11:24:01 [name] ([id number]) -------------------------------------------------------------------------------- HPI Details Patient Name: [name], [name] 08/08/2016 10:45 Date of Service: AM Medical Record [id number] Number: Patient Account Number: [id number] May 21, 1930 (82 y.o. Treating RN: Huel Coventry Date of Birth/Sex: Female) Other Clinician: Primary Care Physician: Aram Beecham Treating Evlyn Kanner Referring Physician: Aram Beecham Physician/Extender: Tania Ade in Treatment: 5 History of Present Illness Location: left lower extremity Quality: Patient reports experiencing an aching pain to affected area(s). Severity: Patient states wound (s) are getting better. Duration: Patient has had the wound for < 1 weeks prior to presenting for treatment Timing: Pain in wound is Intermittent (comes and goes) Context: The wound occurred when the patient tripped and injured her left lower extremity where she had some skin tears. Modifying Factors: Other treatment(s) tried include: has been applying some Neosporin ointment over this Associated Signs and Symptoms: Patient reports having: no bleeding or discharge from the wound. HPI Description: 82 year old patient  who is looking much younger than his stated age comes in with a history of having a laceration to her left lower extremity which she sustained about a week ago. She has several medical comorbidities including degenerative arthritis, scoliosis, history of back surgery, pacemaker placement,AMA positive, ulnar neuropathy and left carpal tunnel syndrome. she is also had sclerotherapy for varicose veins in May 2003. her medications include some prednisone at the present time which she may be coming off soon. She went to the [facility] where they have been dressing her wound and she is hear for review. Electronic Signature(s) Signed: 08/08/2016 11:24:07 AM By: Evlyn Kanner MD, FACS Entered By: Evlyn Kanner on 08/08/2016 11:24:07 [name] ([id number]) -------------------------------------------------------------------------------- Physical Exam Details Patient Name: [name], [name] 08/08/2016 10:45 Date of Service: AM Medical Record [id number] Number: Patient Account Number: [id number] [birth year] (82 y.o. Treating RN: Huel Coventry Date of Birth/Sex: Female) Other Clinician: Primary Care Physician: Aram Beecham Treating Evlyn Kanner Referring Physician: Aram Beecham Physician/Extender: Tania Ade in Treatment: 5 Constitutional . Pulse regular. Respirations normal and unlabored. Afebrile. . Eyes Nonicteric. Reactive to light. Ears, Nose, Mouth, and Throat Lips, teeth, and gums WNL.Marland Kitchen Moist mucosa without lesions. Neck supple and nontender. No palpable supraclavicular or cervical adenopathy. Normal sized without goiter. Respiratory WNL. No retractions.. Breath sounds WNL, No rubs, rales, rhonchi, or wheeze.. Cardiovascular Heart rhythm and rate regular, no murmur or gallop.. Pedal Pulses WNL. No clubbing, cyanosis or edema. Chest Breasts symmetical and no nipple discharge.. Breast tissue WNL, no masses, lumps, or tenderness.. Lymphatic No adneopathy. No adenopathy. No  adenopathy. Musculoskeletal Adexa without tenderness or enlargement.. Digits and nails w/o clubbing, cyanosis, infection, petechiae, ischemia, or inflammatory conditions.. Integumentary (Hair, Skin) No suspicious lesions. No crepitus or fluctuance. No peri-wound warmth or erythema. No masses.Marland Kitchen Psychiatric Judgement and insight Intact.. No evidence of depression, anxiety, or agitation.. Notes the wounds both look very good and there is minimal amount of debris which was washed out with moist saline gauze.  Electronic Signature(s) Signed: 08/08/2016 11:24:35 AM By: Evlyn Kanner MD, FACS Entered By: Evlyn Kanner on 08/08/2016 11:24:34 [name] ([id number]) -------------------------------------------------------------------------------- Physician Orders Details Patient Name: [name], [name] 08/08/2016 10:45 Date of Service: AM Medical Record [id number] Number: Patient Account Number: [id number] 1930/09/17 (82 y.o. Treating RN: Huel Coventry Date of Birth/Sex: Female) Other Clinician: Primary Care Physician: Aram Beecham Treating Evlyn Kanner Referring Physician: Aram Beecham Physician/Extender: Tania Ade in Treatment: 5 Verbal / Phone Orders: Yes Clinician: Huel Coventry Read Back and Verified: Yes Diagnosis Coding Wound Cleansing Wound #3 Left,Medial Lower Leg o Clean wound with Normal Saline. o May Shower, gently pat wound dry prior to applying new dressing. Anesthetic Wound #3 Left,Medial Lower Leg o Topical Lidocaine 4% cream applied to wound bed prior to debridement Primary Wound Dressing Wound #3 Left,Medial Lower Leg o Mepitel One Secondary Dressing Wound #3 Left,Medial Lower Leg o Boardered Foam Dressing Dressing Change Frequency Wound #3 Left,Medial Lower Leg o Change dressing every other day. Follow-up Appointments Wound #3 Left,Medial Lower Leg o Return Appointment in 2 weeks. Edema Control Wound #3 Left,Medial Lower Leg o Support  Garment 30-40 mm/Hg pressure to: Electronic Signature(s) Signed: 08/08/2016 3:52:04 PM By: Evlyn Kanner MD, FACS Signed: 08/08/2016 4:33:55 PM By: Elliot Gurney RN, BSN, Kim RN, BSN [address], [place] ([id number]) Entered By: Elliot Gurney, RN, BSN, Kim on 08/08/2016 11:12:20 [name] ([id number]) -------------------------------------------------------------------------------- Problem List Details Patient Name: [name], [name] 08/08/2016 10:45 Date of Service: AM Medical Record [id number] Number: Patient Account Number: [id number] 11/18/1930 (82 y.o. Treating RN: Huel Coventry Date of Birth/Sex: Female) Other Clinician: Primary Care Physician: Aram Beecham Treating Evlyn Kanner Referring Physician: Aram Beecham Physician/Extender: Tania Ade in Treatment: 5 Active Problems ICD-10 Encounter Code Description Active Date Diagnosis S81.812A Laceration without foreign body, left lower leg, initial 07/04/2016 Yes encounter L97.222 Non-pressure chronic ulcer of left calf with fat layer 07/04/2016 Yes exposed Inactive Problems Resolved Problems Electronic Signature(s) Signed: 08/08/2016 11:23:54 AM By: Evlyn Kanner MD, FACS Entered By: Evlyn Kanner on 08/08/2016 11:23:54 [name] ([id number]) -------------------------------------------------------------------------------- Progress Note Details Patient Name: [name], [name] 08/08/2016 10:45 Date of Service: AM Medical Record [id number] Number: Patient Account Number: [id number] 09/21/1930 (82 y.o. Treating RN: Huel Coventry Date of Birth/Sex: Female) Other Clinician: Primary Care Physician: Aram Beecham Treating Evlyn Kanner Referring Physician: Aram Beecham Physician/Extender: Tania Ade in Treatment: 5 Subjective Chief Complaint Information obtained from Patient Patient seen for complaints of Non-Healing Wound to the left lower extremity for about a week History of Present Illness (HPI) The following HPI elements were  documented for the patient's wound: Location: left lower extremity Quality: Patient reports experiencing an aching pain to affected area(s). Severity: Patient states wound (s) are getting better. Duration: Patient has had the wound for < 1 weeks prior to presenting for treatment Timing: Pain in wound is Intermittent (comes and goes) Context: The wound occurred when the patient tripped and injured her left lower extremity where she had some skin tears. Modifying Factors: Other treatment(s) tried include: has been applying some Neosporin ointment over this Associated Signs and Symptoms: Patient reports having: no bleeding or discharge from the wound. 82 year old patient who is looking much younger than his stated age comes in with a history of having a laceration to her left lower extremity which she sustained about a week ago. She has several medical comorbidities including degenerative arthritis, scoliosis, history of back surgery, pacemaker placement,AMA positive, ulnar neuropathy and left carpal tunnel syndrome. she is also had sclerotherapy for varicose veins in May  2003. her medications include some prednisone at the present time which she may be coming off soon. She went to the [place] [facility] where they have been dressing her wound and she is hear for review. Objective Constitutional Pulse regular. Respirations normal and unlabored. Afebrile. Vitals Time Taken: 10:49 AM, Height: 60 in, Weight: 126 lbs, BMI: 24.6, Temperature: 97.7 F, Pulse: 63 bpm, Respiratory Rate: 18 breaths/min, Blood Pressure: 121/47 mmHg. [name], [name] ([id number]) General Notes: Patient instructed to follow-up with PCP regarding diastolic BP. Eyes Nonicteric. Reactive to light. Ears, Nose, Mouth, and Throat Lips, teeth, and gums WNL.Marland Kitchen Moist mucosa without lesions. Neck supple and nontender. No palpable supraclavicular or cervical adenopathy. Normal sized without goiter. Respiratory WNL. No retractions..  Breath sounds WNL, No rubs, rales, rhonchi, or wheeze.. Cardiovascular Heart rhythm and rate regular, no murmur or gallop.. Pedal Pulses WNL. No clubbing, cyanosis or edema. Chest Breasts symmetical and no nipple discharge.. Breast tissue WNL, no masses, lumps, or tenderness.. Lymphatic No adneopathy. No adenopathy. No adenopathy. Musculoskeletal Adexa without tenderness or enlargement.. Digits and nails w/o clubbing, cyanosis, infection, petechiae, ischemia, or inflammatory conditions.Marland Kitchen Psychiatric Judgement and insight Intact.. No evidence of depression, anxiety, or agitation.. General Notes: the wounds both look very good and there is minimal amount of debris which was washed out with moist saline gauze. Integumentary (Hair, Skin) No suspicious lesions. No crepitus or fluctuance. No peri-wound warmth or erythema. No masses.. Wound #3 status is Open. Original cause of wound was Trauma. The wound is located on the Left,Medial Lower Leg. The wound measures 3.2cm length x 5.8cm width x 0.1cm depth; 14.577cm^2 area and 1.458cm^3 volume. The wound is limited to skin breakdown. There is no tunneling or undermining noted. There is a medium amount of serosanguineous drainage noted. The wound margin is flat and intact. There is medium (34-66%) granulation within the wound bed. There is a medium (34-66%) amount of necrotic tissue within the wound bed including Adherent Slough. The periwound skin appearance exhibited: Moist, Ecchymosis. The periwound skin appearance did not exhibit: Callus, Crepitus, Excoriation, Fluctuance, Friable, Induration, Localized Edema, Rash, Scarring, Dry/Scaly, Maceration, Atrophie Blanche, Cyanosis, Hemosiderin Staining, Mottled, Pallor, Rubor, Erythema. Periwound temperature was noted as No Abnormality. The periwound has tenderness on palpation. [name], [name] ([id number]) Assessment Active Problems ICD-10 [phone] - Laceration without foreign body, left lower leg,  initial encounter L97.222 - Non-pressure chronic ulcer of left calf with fat layer exposed Plan Wound Cleansing: Wound #3 Left,Medial Lower Leg: Clean wound with Normal Saline. May Shower, gently pat wound dry prior to applying new dressing. Anesthetic: Wound #3 Left,Medial Lower Leg: Topical Lidocaine 4% cream applied to wound bed prior to debridement Primary Wound Dressing: Wound #3 Left,Medial Lower Leg: Mepitel One Secondary Dressing: Wound #3 Left,Medial Lower Leg: Boardered Foam Dressing Dressing Change Frequency: Wound #3 Left,Medial Lower Leg: Change dressing every other day. Follow-up Appointments: Wound #3 Left,Medial Lower Leg: Return Appointment in 2 weeks. Edema Control: Wound #3 Left,Medial Lower Leg: Support Garment 30-40 mm/Hg pressure to: We'll use a piece of Mepitel over this wound and then some foam and use her dural layer compression stockings to manage her lymphedema. She will come back and see me Monday after next because of the holiday [name], [name] ([id number]) Electronic Signature(s) Signed: 08/08/2016 11:25:52 AM By: Evlyn Kanner MD, FACS Entered By: Evlyn Kanner on 08/08/2016 11:25:52 [name] ([id number]) -------------------------------------------------------------------------------- SuperBill Details Patient Name: [name] Date of Service: 08/08/2016 Medical Record Number: [id number] Patient Account Number: [id number] Date of  Birth/Sex: 29-Aug-1930 (82 y.o. Female) Treating RN: Huel Coventry Primary Care Physician: Aram Beecham Other Clinician: Referring Physician: Aram Beecham Treating Physician/Extender: Rudene Re in Treatment: 5 Diagnosis Coding ICD-10 Codes Code Description [phone] Laceration without foreign body, left lower leg, initial encounter L97.222 Non-pressure chronic ulcer of left calf with fat layer exposed Facility Procedures CPT4 Code: [id number] Description: [phone] - WOUND CARE VISIT-LEV 2 EST  PT Modifier: Quantity: 1 Physician Procedures CPT4 Code Description: [id number] - WC PHYS LEVEL 3 - EST PT ICD-10 Description Diagnosis S81.812A Laceration without foreign body, left lower leg, ini L97.222 Non-pressure chronic ulcer of left calf with fat lay Modifier: tial encounter er exposed Quantity: 1 Electronic Signature(s) Signed: 08/08/2016 11:26:02 AM By: Evlyn Kanner MD, FACS Entered By: Evlyn Kanner on 08/08/2016 11:26:02

## 2016-08-08 NOTE — Progress Notes (Signed)
[name], Camesha V. ([id number]) Visit Report for 08/08/2016 Arrival Information Details Patient Name: [name], [name] Date of Service: 08/08/2016 10:45 AM Medical Record Number: [id number] Patient Account Number: [id number] Date of Birth/Sex: 08/07/1930 (82 y.o. Female) Treating RN: Huel CoventryWoody, Kim Primary Care Physician: Aram BeechamSparks, Jeffrey Other Clinician: Referring Physician: Aram BeechamSparks, Jeffrey Treating Physician/Extender: Rudene ReBritto, Errol Weeks in Treatment: 5 Visit Information History Since Last Visit Added or deleted any medications: No Patient Arrived: [facility] MorCane Any new allergies or adverse reactions: No Arrival Time: 10:48 Had a fall or experienced change in No Accompanied By: self activities of daily living that may affect Transfer Assistance: None risk of falls: Patient Identification Verified: Yes Signs or symptoms of abuse/neglect since last No Secondary Verification Process Completed: Yes visito Patient Requires Transmission-Based No Hospitalized since last visit: No Precautions: Has Dressing in Place as Prescribed: Yes Patient Has Alerts: No Has Compression in Place as Prescribed: Yes Pain Present Now: No Electronic Signature(s) Signed: 08/08/2016 4:33:55 PM By: Elliot GurneyWoody, RN, BSN, Kim RN, BSN Entered By: Elliot GurneyWoody, RN, BSN, Kim on 08/08/2016 10:49:20 [name], [name] ([id number]) -------------------------------------------------------------------------------- Clinic Level of Care Assessment Details Patient Name: [name], Angelissa V. Date of Service: 08/08/2016 10:45 AM Medical Record Number: [id number] Patient Account Number: [id number] Date of Birth/Sex: 02/04/1930 (82 y.o. Female) Treating RN: Huel CoventryWoody, Kim Primary Care Physician: Aram BeechamSparks, Jeffrey Other Clinician: Referring Physician: Aram BeechamSparks, Jeffrey Treating Physician/Extender: Rudene ReBritto, Errol Weeks in Treatment: 5 Clinic Level of Care Assessment Items TOOL 4 Quantity Score []  - Use when only an EandM is performed on FOLLOW-UP visit  0 ASSESSMENTS - Nursing Assessment / Reassessment []  - Reassessment of Co-morbidities (includes updates in patient status) 0 X - Reassessment of Adherence to Treatment Plan 1 5 ASSESSMENTS - Wound and Skin Assessment / Reassessment X - Simple Wound Assessment / Reassessment - one wound 1 5 []  - Complex Wound Assessment / Reassessment - multiple wounds 0 []  - Dermatologic / Skin Assessment (not related to wound area) 0 ASSESSMENTS - Focused Assessment X - Circumferential Edema Measurements - multi extremities 1 5 []  - Nutritional Assessment / Counseling / Intervention 0 []  - Lower Extremity Assessment (monofilament, tuning fork, pulses) 0 []  - Peripheral Arterial Disease Assessment (using hand held doppler) 0 ASSESSMENTS - Ostomy and/or Continence Assessment and Care []  - Incontinence Assessment and Management 0 []  - Ostomy Care Assessment and Management (repouching, etc.) 0 PROCESS - Coordination of Care []  - Simple Patient / Family Education for ongoing care 0 []  - Complex (extensive) Patient / Family Education for ongoing care 0 []  - Staff obtains ChiropractorConsents, Records, Test Results / Process Orders 0 []  - Staff telephones HHA, Nursing Homes / Clarify orders / etc 0 []  - Routine Transfer to another Facility (non-emergent condition) 0 [name], [name] ([id number]) []  - Routine Hospital Admission (non-emergent condition) 0 []  - New Admissions / Manufacturing engineernsurance Authorizations / Ordering NPWT, Apligraf, etc. 0 []  - Emergency Hospital Admission (emergent condition) 0 X - Simple Discharge Coordination 1 10 []  - Complex (extensive) Discharge Coordination 0 PROCESS - Special Needs []  - Pediatric / Minor Patient Management 0 []  - Isolation Patient Management 0 []  - Hearing / Language / Visual special needs 0 []  - Assessment of Community assistance (transportation, D/C planning, etc.) 0 []  - Additional assistance / Altered mentation 0 []  - Support Surface(s) Assessment (bed, cushion, seat, etc.)  0 INTERVENTIONS - Wound Cleansing / Measurement X - Simple Wound Cleansing - one wound 1 5 []  - Complex Wound Cleansing - multiple wounds 0 X - Wound  Imaging (photographs - any number of wounds) 1 5 []  - Wound Tracing (instead of photographs) 0 X - Simple Wound Measurement - one wound 1 5 []  - Complex Wound Measurement - multiple wounds 0 INTERVENTIONS - Wound Dressings X - Small Wound Dressing one or multiple wounds 1 10 []  - Medium Wound Dressing one or multiple wounds 0 []  - Large Wound Dressing one or multiple wounds 0 []  - Application of Medications - topical 0 []  - Application of Medications - injection 0 INTERVENTIONS - Miscellaneous []  - External ear exam 0 [name], [name] ( ) []  - Specimen Collection (cultures, biopsies, blood, body fluids, etc.) 0 []  - Specimen(s) / Culture(s) sent or taken to Lab for analysis 0 []  - Patient Transfer (multiple staff / Lift / Similar devices) 0 []  - Simple Staple / Suture removal (25 or less) 0 []  - Complex Staple / Suture removal (26 or more) 0 []  - Hypo / Hyperglycemic Management (close monitor of Blood Glucose) 0 []  - Ankle / Brachial Index (ABI) - do not check if billed separately 0 X - Vital Signs 1 5 Has the patient been seen at the hospital within the last three years: Yes Total Score: 55 Level Of Care: New/Established - Level 2 Electronic Signature(s) Signed: 08/08/2016 4:33:55 PM By: , RN, BSN, Kim RN, BSN Entered By: , RN, BSN, Kim on 08/08/2016 11:13:06 [name] ([id number]) -------------------------------------------------------------------------------- Encounter Discharge Information Details Patient Name: Date of Service: 08/08/2016 10:45 AM Medical Record Number: Patient Account Number: Michiel Sites Date of Birth/Sex: 11-29-1930 (82 y.o. Female) Treating RN: Primary Care Physician: Other Clinician: Referring Physician: 08/10/2016 Treating Physician/Extender: Elliot Gurney in Treatment: 5 Encounter Discharge Information Items Discharge Pain Level: 0 Discharge Condition: Stable Ambulatory Status: Cane Discharge Destination: Home Transportation: Private Auto Accompanied By: self Schedule Follow-up Appointment: Yes Medication Reconciliation completed Yes and provided to Patient/Care Codie Krogh: Provided on Clinical Summary of Care: 08/08/2016 Form Type Recipient Paper Patient [facility] Electronic Signature(s) Signed: 08/08/2016 11:24:36 AM By: [id number] Entered By: [name] on 08/08/2016 11:24:36 [id number] ([id number]) -------------------------------------------------------------------------------- Lower Extremity Assessment Details Patient Name: 13/05/1930 Date of Service: 08/08/2016 10:45 AM Medical Record Number: Huel Coventry Patient Account Number: Aram Beecham Date of Birth/Sex: Jun 29, 1930 (83 y.o. Female) Treating RN: 08/10/2016 Primary Care Physician: [facility] Other Clinician: Referring Physician: 08/10/2016 Treating Physician/Extender: Gwenlyn Perking in Treatment: 5 Edema Assessment Assessed: [Left: No] [Right: No] E[Left: dema] [Right: :] Calf Left: Right: Point of Measurement: 24 cm From Medial Instep 28.7 cm cm Ankle Left: Right: Point of Measurement: 10 cm From Medial Instep 19.5 cm cm Vascular Assessment Pulses: Posterior Tibial Dorsalis Pedis Palpable: [Left:Yes] Extremity colors, hair growth, and conditions: Extremity Color: [Left:Hyperpigmented] Hair Growth on Extremity: [Left:No] Temperature of Extremity: [Left:Warm] Capillary Refill: [Left:< 3 seconds] Toe Nail Assessment Left: Right: Thick: Yes Discolored: No Deformed: No Improper Length and Hygiene: No Electronic Signature(s) Signed: 08/08/2016 4:33:55 PM By: 08/10/2016, RN, BSN, Kim RN, BSN Entered By: Natasha Basset, RN, BSN, Kim on 08/08/2016 10:58:45 [name]  (08/10/2016) -------------------------------------------------------------------------------- Multi Wound Chart Details Patient Name: [id number] Date of Service: 08/08/2016 10:45 AM Medical Record Number: 13/05/1930 Patient Account Number: [id number] Date of Birth/Sex: [birth year] (82 y.o. Female) Treating RN: Aram Beecham Primary Care Physician: Rudene Re Other Clinician: Referring Physician: 08/10/2016 Treating Physician/Extender: Elliot Gurney in Treatment: 5 Vital Signs Height(in): 60 Pulse(bpm): 63 Weight(lbs): 126 Blood Pressure 121/47 (mmHg):  Body Mass Index(BMI): 25 Temperature(F): 97.7 Respiratory Rate 18 (breaths/min): Photos: [N/A:N/A] Wound Location: Left Lower Leg - Medial N/A N/A Wounding Event: Trauma N/A N/A Primary Etiology: Trauma, Other N/A N/A Comorbid History: Hypertension, Gout, N/A N/A Osteoarthritis, Neuropathy Date Acquired: 06/27/2016 N/A N/A Weeks of Treatment: 5 N/A N/A Wound Status: Open N/A N/A Clustered Wound: Yes N/A N/A Measurements L x W x D 3.2x5.8x0.1 N/A N/A (cm) Area (cm) : 14.577 N/A N/A Volume (cm) : 1.458 N/A N/A % Reduction in Area: 61.30% N/A N/A % Reduction in Volume: 61.30% N/A N/A Classification: Partial Thickness N/A N/A Exudate Amount: Medium N/A N/A Exudate Type: Serosanguineous N/A N/A Exudate Color: red, brown N/A N/A Wound Margin: Flat and Intact N/A N/A Granulation Amount: Medium (34-66%) N/A N/A Necrotic Amount: Medium (34-66%) N/A N/A [name], [name] ([id number]) Exposed Structures: Fascia: No N/A N/A Fat: No Tendon: No Muscle: No Joint: No Bone: No Limited to Skin Breakdown Epithelialization: Small (1-33%) N/A N/A Periwound Skin Texture: Edema: No N/A N/A Excoriation: No Induration: No Callus: No Crepitus: No Fluctuance: No Friable: No Rash: No Scarring: No Periwound Skin Moist: Yes N/A N/A Moisture: Maceration: No Dry/Scaly: No Periwound Skin Color: Ecchymosis: Yes N/A  N/A Atrophie Blanche: No Cyanosis: No Erythema: No Hemosiderin Staining: No Mottled: No Pallor: No Rubor: No Temperature: No Abnormality N/A N/A Tenderness on Yes N/A N/A Palpation: Wound Preparation: Ulcer Cleansing: N/A N/A Rinsed/Irrigated with Saline Topical Anesthetic Applied: Other: lidocaine 4% Treatment Notes Electronic Signature(s) Signed: 08/08/2016 4:33:55 PM By: Elliot Gurney, RN, BSN, Kim RN, BSN Entered By: Elliot Gurney, RN, BSN, Kim on 08/08/2016 11:08:50 [name] ([id number]) -------------------------------------------------------------------------------- Multi-Disciplinary Care Plan Details Patient Name: [name], [name] Date of Service: 08/08/2016 10:45 AM Medical Record Number: [id number] Patient Account Number: [id number] Date of Birth/Sex: Dec 21, 1929 (82 y.o. Female) Treating RN: Huel Coventry Primary Care Physician: Aram Beecham Other Clinician: Referring Physician: Aram Beecham Treating Physician/Extender: Rudene Re in Treatment: 5 Active Inactive Abuse / Safety / Falls / Self Care Management Nursing Diagnoses: Potential for falls Goals: Patient will remain injury free Date Initiated: 07/04/2016 Goal Status: Active Interventions: Assess fall risk on admission and as needed Notes: Soft Tissue Infection Nursing Diagnoses: Impaired tissue integrity Potential for infection: soft tissue Goals: Patient will remain free of wound infection Date Initiated: 07/04/2016 Goal Status: Active Interventions: Assess signs and symptoms of infection every visit Notes: Wound/Skin Impairment Nursing Diagnoses: Impaired tissue integrity Goals: Ulcer/skin breakdown will have a volume reduction of 30% by week 4 [name], [name] ([id number]) Date Initiated: 07/04/2016 Goal Status: Active Interventions: Assess ulceration(s) every visit Notes: Electronic Signature(s) Signed: 08/08/2016 4:33:55 PM By: Elliot Gurney, RN, BSN, Kim RN, BSN Entered By: Elliot Gurney, RN, BSN,  Kim on 08/08/2016 11:02:39 [name] ([id number]) -------------------------------------------------------------------------------- Pain Assessment Details Patient Name: [name] Date of Service: 08/08/2016 10:45 AM Medical Record Number: [id number] Patient Account Number: [id number] Date of Birth/Sex: [birth year] (82 y.o. Female) Treating RN: Huel Coventry Primary Care Physician: Aram Beecham Other Clinician: Referring Physician: Aram Beecham Treating Physician/Extender: Rudene Re in Treatment: 5 Active Problems Location of Pain Severity and Description of Pain Patient Has Paino No Site Locations With Dressing Change: No Pain Management and Medication Current Pain Management: Electronic Signature(s) Signed: 08/08/2016 4:33:55 PM By: Elliot Gurney, RN, BSN, Kim RN, BSN Entered By: Elliot Gurney, RN, BSN, Kim on 08/08/2016 10:49:27 [name] ([id number]) -------------------------------------------------------------------------------- Patient/Caregiver Education Details Patient Name: [name] Date of Service: 08/08/2016 10:45 AM Medical Record Number: [id number] Patient Account Number: [id number] Date  of Birth/Gender: August 05, 1930 (82 y.o. Female) Treating RN: Huel Coventry Primary Care Physician: Aram Beecham Other Clinician: Referring Physician: Aram Beecham Treating Physician/Extender: Rudene Re in Treatment: 5 Education Assessment Education Provided To: Patient Education Topics Provided Venous: Handouts: Controlling Swelling with Compression Stockings Methods: Demonstration Responses: State content correctly Wound/Skin Impairment: Handouts: Caring for Your Ulcer, Other: wound care every other day as prescribed Methods: Demonstration Responses: State content correctly Electronic Signature(s) Signed: 08/08/2016 4:33:55 PM By: Elliot Gurney, RN, BSN, Kim RN, BSN Entered By: Elliot Gurney, RN, BSN, Kim on 08/08/2016 11:15:33 [name]  ([id number]) -------------------------------------------------------------------------------- Wound Assessment Details Patient Name: [name] Date of Service: 08/08/2016 10:45 AM Medical Record Number: [id number] Patient Account Number: [id number] Date of Birth/Sex: 11-07-1930 (82 y.o. Female) Treating RN: Huel Coventry Primary Care Physician: Aram Beecham Other Clinician: Referring Physician: Aram Beecham Treating Physician/Extender: Rudene Re in Treatment: 5 Wound Status Wound Number: 3 Primary Trauma, Other Etiology: Wound Location: Left Lower Leg - Medial Wound Status: Open Wounding Event: Trauma Comorbid Hypertension, Gout, Osteoarthritis, Date Acquired: 06/27/2016 History: Neuropathy Weeks Of Treatment: 5 Clustered Wound: Yes Photos Wound Measurements Length: (cm) 3.2 Width: (cm) 5.8 Depth: (cm) 0.1 Area: (cm) 14.577 Volume: (cm) 1.458 % Reduction in Area: 61.3% % Reduction in Volume: 61.3% Epithelialization: Small (1-33%) Tunneling: No Undermining: No Wound Description Classification: Partial Thickness Wound Margin: Flat and Intact Exudate Amount: Medium Exudate Type: Serosanguineous Exudate Color: red, brown Foul Odor After Cleansing: No Wound Bed Granulation Amount: Medium (34-66%) Exposed Structure Necrotic Amount: Medium (34-66%) Fascia Exposed: No Necrotic Quality: Adherent Slough Fat Layer Exposed: No Tendon Exposed: No Muscle Exposed: No [name], [name] ([id number]) Joint Exposed: No Bone Exposed: No Limited to Skin Breakdown Periwound Skin Texture Texture Color No Abnormalities Noted: No No Abnormalities Noted: No Callus: No Atrophie Blanche: No Crepitus: No Cyanosis: No Excoriation: No Ecchymosis: Yes Fluctuance: No Erythema: No Friable: No Hemosiderin Staining: No Induration: No Mottled: No Localized Edema: No Pallor: No Rash: No Rubor: No Scarring: No Temperature / Pain Moisture Temperature: No  Abnormality No Abnormalities Noted: No Tenderness on Palpation: Yes Dry / Scaly: No Maceration: No Moist: Yes Wound Preparation Ulcer Cleansing: Rinsed/Irrigated with Saline Topical Anesthetic Applied: Other: lidocaine 4%, Treatment Notes Wound #3 (Left, Medial Lower Leg) 1. Cleansed with: Clean wound with Normal Saline May Shower, gently pat wound dry prior to applying new dressing. 2. Anesthetic Topical Lidocaine 4% cream to wound bed prior to debridement 4. Dressing Applied: Mepitel 5. Secondary Dressing Applied Bordered Foam Dressing 6. Footwear/Offloading device applied Compression stockings Electronic Signature(s) Signed: 08/08/2016 4:33:55 PM By: Elliot Gurney, RN, BSN, Kim RN, BSN Entered By: Elliot Gurney, RN, BSN, Kim on 08/08/2016 11:01:12 [name] ([id number]) -------------------------------------------------------------------------------- Vitals Details Patient Name: [name] Date of Service: 08/08/2016 10:45 AM Medical Record Number: [id number] Patient Account Number: [id number] Date of Birth/Sex: 21-Dec-1929 (82 y.o. Female) Treating RN: Huel Coventry Primary Care Physician: Aram Beecham Other Clinician: Referring Physician: Aram Beecham Treating Physician/Extender: Rudene Re in Treatment: 5 Vital Signs Time Taken: 10:49 Temperature (F): 97.7 Height (in): 60 Pulse (bpm): 63 Weight (lbs): 126 Respiratory Rate (breaths/min): 18 Body Mass Index (BMI): 24.6 Blood Pressure (mmHg): 121/47 Reference Range: 80 - 120 mg / dl Notes Patient instructed to follow-up with PCP regarding diastolic BP. Electronic Signature(s) Signed: 08/08/2016 4:33:55 PM By: Elliot Gurney, RN, BSN, Kim RN, BSN Entered By: Elliot Gurney, RN, BSN, Kim on 08/08/2016 10:54:27

## 2016-08-18 ENCOUNTER — Encounter: Payer: Medicare Other | Attending: Surgery | Admitting: Surgery

## 2016-08-18 DIAGNOSIS — Z880 Allergy status to penicillin: Secondary | ICD-10-CM | POA: Insufficient documentation

## 2016-08-18 DIAGNOSIS — S81812A Laceration without foreign body, left lower leg, initial encounter: Secondary | ICD-10-CM | POA: Insufficient documentation

## 2016-08-18 DIAGNOSIS — L97222 Non-pressure chronic ulcer of left calf with fat layer exposed: Secondary | ICD-10-CM | POA: Insufficient documentation

## 2016-08-18 DIAGNOSIS — M1990 Unspecified osteoarthritis, unspecified site: Secondary | ICD-10-CM | POA: Insufficient documentation

## 2016-08-18 DIAGNOSIS — M419 Scoliosis, unspecified: Secondary | ICD-10-CM | POA: Insufficient documentation

## 2016-08-18 DIAGNOSIS — W19XXXA Unspecified fall, initial encounter: Secondary | ICD-10-CM | POA: Insufficient documentation

## 2016-08-18 DIAGNOSIS — G629 Polyneuropathy, unspecified: Secondary | ICD-10-CM | POA: Diagnosis not present

## 2016-08-18 DIAGNOSIS — M109 Gout, unspecified: Secondary | ICD-10-CM | POA: Diagnosis not present

## 2016-08-18 DIAGNOSIS — Z950 Presence of cardiac pacemaker: Secondary | ICD-10-CM | POA: Insufficient documentation

## 2016-08-18 DIAGNOSIS — I10 Essential (primary) hypertension: Secondary | ICD-10-CM | POA: Diagnosis not present

## 2016-08-19 NOTE — Progress Notes (Signed)
[name], [name] ([id number]) Visit Report for 08/18/2016 Chief Complaint Document Details Patient Name: [name]. Date of Service: 08/18/2016 10:45 AM Medical Record Number: [id number] Patient Account Number: [id number] Date of Birth/Sex: 20-Feb-1930 (82 y.o. Female) Treating RN: Huel Coventry Primary Care Physician: Aram Beecham Other Clinician: Referring Physician: Aram Beecham Treating Physician/Extender: Rudene Re in Treatment: 6 Information Obtained from: Patient Chief Complaint Patient seen for complaints of Non-Healing Wound to the left lower extremity for about a week Electronic Signature(s) Signed: 08/18/2016 11:34:27 AM By: Evlyn Kanner MD, FACS Entered By: Evlyn Kanner on 08/18/2016 11:34:27 [name] ([id number]) -------------------------------------------------------------------------------- HPI Details Patient Name: [name] Date of Service: 08/18/2016 10:45 AM Medical Record Number: [id number] Patient Account Number: [id number] Date of Birth/Sex: 11/04/1930 (82 y.o. Female) Treating RN: Huel Coventry Primary Care Physician: Aram Beecham Other Clinician: Referring Physician: Aram Beecham Treating Physician/Extender: Rudene Re in Treatment: 6 History of Present Illness Location: left lower extremity Quality: Patient reports experiencing an aching pain to affected area(s). Severity: Patient states wound (s) are getting better. Duration: Patient has had the wound for < 1 weeks prior to presenting for treatment Timing: Pain in wound is Intermittent (comes and goes) Context: The wound occurred when the patient tripped and injured her left lower extremity where she had some skin tears. Modifying Factors: Other treatment(s) tried include: has been applying some Neosporin ointment over this Associated Signs and Symptoms: Patient reports having: no bleeding or discharge from the wound. HPI Description: 82 year old patient who is  looking much younger than his stated age comes in with a history of having a laceration to her left lower extremity which she sustained about a week ago. She has several medical comorbidities including degenerative arthritis, scoliosis, history of back surgery, pacemaker placement,AMA positive, ulnar neuropathy and left carpal tunnel syndrome. she is also had sclerotherapy for varicose veins in May 2003. her medications include some prednisone at the present time which she may be coming off soon. She went to the [facility] where they have been dressing her wound and she is hear for review. 08/18/2016 -- a small traumatic ulceration just superior medial to her previous wound and this was caused while she was trying to get her dressing off Electronic Signature(s) Signed: 08/18/2016 11:34:52 AM By: Evlyn Kanner MD, FACS Entered By: Evlyn Kanner on 08/18/2016 11:34:52 [name] ([id number]) -------------------------------------------------------------------------------- Physical Exam Details Patient Name: [name] Date of Service: 08/18/2016 10:45 AM Medical Record Number: [id number] Patient Account Number: [id number] Date of Birth/Sex: 1930-05-23 (82 y.o. Female) Treating RN: Huel Coventry Primary Care Physician: Aram Beecham Other Clinician: Referring Physician: Aram Beecham Treating Physician/Extender: Rudene Re in Treatment: 6 Constitutional . Pulse regular. Respirations normal and unlabored. Afebrile. . Eyes Nonicteric. Reactive to light. Ears, Nose, Mouth, and Throat Lips, teeth, and gums WNL.Marland Kitchen Moist mucosa without lesions. Neck supple and nontender. No palpable supraclavicular or cervical adenopathy. Normal sized without goiter. Respiratory WNL. No retractions.. Breath sounds WNL, No rubs, rales, rhonchi, or wheeze.. Cardiovascular Heart rhythm and rate regular, no murmur or gallop.. Pedal Pulses WNL. No clubbing, cyanosis or edema. Lymphatic No  adneopathy. No adenopathy. No adenopathy. Musculoskeletal Adexa without tenderness or enlargement.. Digits and nails w/o clubbing, cyanosis, infection, petechiae, ischemia, or inflammatory conditions.. Integumentary (Hair, Skin) No suspicious lesions. No crepitus or fluctuance. No peri-wound warmth or erythema. No masses.Marland Kitchen Psychiatric Judgement and insight Intact.. No evidence of depression, anxiety, or agitation.. Notes wound has minimal hyper granulation tissue and the new area  is fairly superficial and no debridement was required today. Electronic Signature(s) Signed: 08/18/2016 11:35:20 AM By: Evlyn Kanner MD, FACS Entered By: Evlyn Kanner on 08/18/2016 11:35:19 [name] ([id number]) -------------------------------------------------------------------------------- Physician Orders Details Patient Name: [name] Date of Service: 08/18/2016 10:45 AM Medical Record Patient Account Number: [id number] Number: Afful, RN, BSN, Treating RN: Nov 05, 1930 (82 y.o. [name] Date of Birth/Sex: Female) Other Clinician: Primary Care Physician: Aram Beecham Treating Evlyn Kanner Referring Physician: Aram Beecham Physician/Extender: Tania Ade in Treatment: 6 Verbal / Phone Orders: Yes Clinician: Afful, RN, BSN, Rita Read Back and Verified: Yes Diagnosis Coding Wound Cleansing Wound #3 Left,Medial Lower Leg o Clean wound with Normal Saline. o May Shower, gently pat wound dry prior to applying new dressing. Anesthetic Wound #3 Left,Medial Lower Leg o Topical Lidocaine 4% cream applied to wound bed prior to debridement Primary Wound Dressing Wound #3 Left,Medial Lower Leg o Hydrafera Blue Wound #4 Left,Medial Lower Leg o Hydrafera Blue Secondary Dressing o ABD and Kerlix/Conform Wound #3 Left,Medial Lower Leg o ABD and Kerlix/Conform Dressing Change Frequency Wound #3 Left,Medial Lower Leg o Change dressing every other day. Follow-up  Appointments Wound #3 Left,Medial Lower Leg o Return Appointment in 2 weeks. Edema Control Wound #3 Left,Medial Lower Leg o Support Garment 30-40 mm/Hg pressure to: [name], [name] ([id number]) Additional Orders / Instructions Wound #3 Left,Medial Lower Leg o Increase protein intake. o Activity as tolerated Wound #4 Left,Medial Lower Leg o Increase protein intake. o Activity as tolerated Electronic Signature(s) Signed: 08/18/2016 4:26:59 PM By: Evlyn Kanner MD, FACS Signed: 08/18/2016 5:31:48 PM By: Elpidio Eric BSN, RN Entered By: Elpidio Eric on 08/18/2016 11:25:31 [name] ([id number]) -------------------------------------------------------------------------------- Problem List Details Patient Name: [name] Date of Service: 08/18/2016 10:45 AM Medical Record Number: [id number] Patient Account Number: [id number] Date of Birth/Sex: [birth year] (82 y.o. 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 Female) Treating RN: Huel Coventry Primary Care Physician: Aram Beecham Other Clinician: Referring  Physician: Aram Beecham Treating Physician/Extender: Rudene Re in Treatment: 6 Subjective Chief Complaint Information obtained from Patient Patient seen for complaints of Non-Healing Wound to the left lower extremity for about a week History of Present Illness (HPI) The following HPI elements were documented for the patient's wound: Location: left lower extremity Quality: Patient reports experiencing an aching pain to affected area(s). Severity: Patient states wound (s) are getting better. Duration: Patient has had the wound for < 1 weeks prior to presenting for treatment Timing: Pain in wound is Intermittent (comes and goes) Context: The wound occurred when the patient tripped and injured her left lower extremity where she had some skin tears. Modifying Factors: Other treatment(s) tried include: has been applying some Neosporin ointment over this Associated Signs and Symptoms: Patient reports having: no bleeding or discharge from the wound. 82 year old patient who is looking much younger than his stated age comes in with  a history of having a laceration to her left lower extremity which she sustained about a week ago. She has several medical comorbidities including degenerative arthritis, scoliosis, history of back surgery, pacemaker placement,AMA positive, ulnar neuropathy and left carpal tunnel syndrome. she is also had sclerotherapy for varicose veins in May 2003. her medications include some prednisone at the present time which she may be coming off soon. She went to the [facility] where they have been dressing her wound and she is hear for review. 08/18/2016 -- a small traumatic ulceration just superior medial to her previous wound and this was caused while she was trying to get her dressing off Objective Constitutional Pulse regular. Respirations normal and unlabored. Afebrile. Vitals Time Taken: 11:01 AM, Height: 60 in, Weight: 126 lbs, BMI: 24.6, Temperature:  97.8 F, Pulse: 62 [name], [name] ([id number]) bpm, Respiratory Rate: 16 breaths/min, Blood Pressure: 148/48 mmHg. Eyes Nonicteric. Reactive to light. Ears, Nose, Mouth, and Throat Lips, teeth, and gums WNL.Marland Kitchen Moist mucosa without lesions. Neck supple and nontender. No palpable supraclavicular or cervical adenopathy. Normal sized without goiter. Respiratory WNL. No retractions.. Breath sounds WNL, No rubs, rales, rhonchi, or wheeze.. Cardiovascular Heart rhythm and rate regular, no murmur or gallop.. Pedal Pulses WNL. No clubbing, cyanosis or edema. Lymphatic No adneopathy. No adenopathy. No adenopathy. Musculoskeletal Adexa without tenderness or enlargement.. Digits and nails w/o clubbing, cyanosis, infection, petechiae, ischemia, or inflammatory conditions.Marland Kitchen Psychiatric Judgement and insight Intact.. No evidence of depression, anxiety, or agitation.. General Notes: wound has minimal hyper granulation tissue and the new area is fairly superficial and no debridement was required today. Integumentary (Hair, Skin) No suspicious lesions. No crepitus or fluctuance. No peri-wound warmth or erythema. No masses.. Wound #3 status is Open. Original cause of wound was Trauma. The wound is located on the Left,Medial Lower Leg. The wound measures 3.2cm length x 4.5cm width x 0.1cm depth; 11.31cm^2 area and 1.131cm^3 volume. The wound is limited to skin breakdown. There is no tunneling or undermining noted. There is a medium amount of serosanguineous drainage noted. The wound margin is flat and intact. There is medium (34-66%) granulation within the wound bed. There is a medium (34-66%) amount of necrotic tissue within the wound bed including Adherent Slough. The periwound skin appearance exhibited: Moist, Ecchymosis. The periwound skin appearance did not exhibit: Callus, Crepitus, Excoriation, Fluctuance, Friable, Induration, Localized Edema, Rash, Scarring, Dry/Scaly, Maceration, Atrophie  Blanche, Cyanosis, Hemosiderin Staining, Mottled, Pallor, Rubor, Erythema. Periwound temperature was noted as No Abnormality. The periwound has tenderness on palpation. Wound #4 status is Open. Original cause of wound was Trauma. The wound is located on the Left,Medial Lower Leg. The wound measures 0.7cm length x 0.7cm width x 0.1cm depth; 0.385cm^2 area and 0.038cm^3 volume. The wound is limited to skin breakdown. There is no tunneling or undermining noted. There is a medium amount of serosanguineous drainage noted. The wound margin is flat and intact. There is medium (34-66%) red granulation within the wound bed. There is a medium (34-66%) amount of necrotic [name], [name]. ([id number]) tissue within the wound bed including Eschar and Adherent Slough. The periwound skin appearance exhibited: Moist. The periwound skin appearance did not exhibit: Callus, Crepitus, Excoriation, Fluctuance, Friable, Induration, Localized Edema, Rash, Scarring, Dry/Scaly, Maceration, Atrophie Blanche, Cyanosis, Ecchymosis, Hemosiderin Staining, Mottled, Pallor, Rubor, Erythema. Assessment Active Problems ICD-10 S81.812A - Laceration without foreign body, left lower leg, initial encounter L97.222 - Non-pressure chronic ulcer of left calf with fat layer exposed Plan Wound Cleansing: Wound #3 Left,Medial Lower  Leg: Clean wound with Normal Saline. May Shower, gently pat wound dry prior to applying new dressing. Anesthetic: Wound #3 Left,Medial Lower Leg: Topical Lidocaine 4% cream applied to wound bed prior to debridement Primary Wound Dressing: Wound #3 Left,Medial Lower Leg: Hydrafera Blue Wound #4 Left,Medial Lower Leg: Hydrafera Blue Secondary Dressing: ABD and Kerlix/Conform Wound #3 Left,Medial Lower Leg: ABD and Kerlix/Conform Dressing Change Frequency: Wound #3 Left,Medial Lower Leg: Change dressing every other day. Follow-up Appointments: Wound #3 Left,Medial Lower Leg: Return Appointment in  2 weeks. Edema Control: Wound #3 Left,Medial Lower Leg: Support Garment 30-40 mm/Hg pressure to: Additional Orders / Instructions: Wound #3 Left,Medial Lower Leg: BIRTIE, FULLAM ([id number]) Increase protein intake. Activity as tolerated Wound #4 Left,Medial Lower Leg: Increase protein intake. Activity as tolerated We'll use a piece of RTD over this wound and then some foam and use her dural layer compression stockings to manage her lymphedema. She will come back and see me next week Electronic Signature(s) Signed: 08/18/2016 11:36:07 AM By: Evlyn Kanner MD, FACS Entered By: Evlyn Kanner on 08/18/2016 11:36:06 [name] ([id number]) -------------------------------------------------------------------------------- SuperBill Details Patient Name: [name] Date of Service: 08/18/2016 Medical Record Number: [id number] Patient Account Number: [id number] Date of Birth/Sex: 1930/03/21 (82 y.o. Female) Treating RN: Huel Coventry Primary Care Physician: Aram Beecham Other Clinician: Referring Physician: Aram Beecham Treating Physician/Extender: Rudene Re in Treatment: 6 Diagnosis Coding ICD-10 Codes Code Description [id number] Laceration without foreign body, left lower leg, initial encounter L97.222 Non-pressure chronic ulcer of left calf with fat layer exposed Facility Procedures CPT4 Code: [id number] Description: 99213 - WOUND CARE VISIT-LEV 3 EST PT Modifier: Quantity: 1 Physician Procedures CPT4 Code Description: [id number] 99213 - WC PHYS LEVEL 3 - EST PT ICD-10 Description Diagnosis S81.812A Laceration without foreign body, left lower leg, ini L97.222 Non-pressure chronic ulcer of left calf with fat lay Modifier: tial encounter er exposed Quantity: 1 Electronic Signature(s) Signed: 08/18/2016 11:42:29 AM By: Elpidio Eric BSN, RN Signed: 08/18/2016 4:26:59 PM By: Evlyn Kanner MD, FACS Previous Signature: 08/18/2016 11:36:19 AM Version By: Evlyn Kanner MD,  FACS Entered By: Elpidio Eric on 08/18/2016 11:42:29

## 2016-08-19 NOTE — Progress Notes (Signed)
ARINE, FOLEY ([id number]) Visit Report for 08/18/2016 Arrival Information Details Patient Name: [name], [name]. Date of Service: 08/18/2016 10:45 AM Medical Record Number: [id number] Patient Account Number: [id number] Date of Birth/Sex: 06-12-1930 (82 y.o. Female) Treating RN: Huel Coventry Primary Care Physician: Aram Beecham Other Clinician: Referring Physician: Aram Beecham Treating Physician/Extender: Rudene Re in Treatment: 6 Visit Information History Since Last Visit Added or deleted any medications: No Patient Arrived: Ambulatory Any new allergies or adverse reactions: No Arrival Time: 11:00 Had a fall or experienced change in No Accompanied By: self activities of daily living that may affect Transfer Assistance: None risk of falls: Patient Identification Verified: Yes Signs or symptoms of abuse/neglect since last No Secondary Verification Process Yes visito Completed: Hospitalized since last visit: No Patient Requires Transmission-Based No Has Dressing in Place as Prescribed: Yes Precautions: Has Compression in Place as Prescribed: Yes Patient Has Alerts: No Pain Present Now: No Electronic Signature(s) Signed: 08/18/2016 3:26:10 PM By: Elliot Gurney, RN, BSN, Kim RN, BSN Entered By: Elliot Gurney, RN, BSN, Kim on 08/18/2016 11:01:27 [name] ([id number]) -------------------------------------------------------------------------------- Clinic Level of Care Assessment Details Patient Name: [name] Date of Service: 08/18/2016 10:45 AM Medical Record Number: [id number] Patient Account Number: [id number] Date of Birth/Sex: 10-18-1930 (82 y.o. Female) Treating RN: Afful, RN, BSN, Rita Primary Care Physician: Aram Beecham Other Clinician: Referring Physician: Aram Beecham Treating Physician/Extender: Rudene Re in Treatment: 6 Clinic Level of Care Assessment Items TOOL 4 Quantity Score []  - Use when only [name] EandM is performed on FOLLOW-UP visit  0 ASSESSMENTS - Nursing Assessment / Reassessment X - Reassessment of Co-morbidities (includes updates in patient status) 1 10 X - Reassessment of Adherence to Treatment Plan 1 5 ASSESSMENTS - Wound and Skin Assessment / Reassessment []  - Simple Wound Assessment / Reassessment - one wound 0 X - Complex Wound Assessment / Reassessment - multiple wounds 2 5 []  - Dermatologic / Skin Assessment (not related to wound area) 0 ASSESSMENTS - Focused Assessment []  - Circumferential Edema Measurements - multi extremities 0 []  - Nutritional Assessment / Counseling / Intervention 0 X - Lower Extremity Assessment (monofilament, tuning fork, pulses) 1 5 []  - Peripheral Arterial Disease Assessment (using hand held doppler) 0 ASSESSMENTS - Ostomy and/or Continence Assessment and Care []  - Incontinence Assessment and Management 0 []  - Ostomy Care Assessment and Management (repouching, etc.) 0 PROCESS - Coordination of Care X - Simple Patient / Family Education for ongoing care 1 15 []  - Complex (extensive) Patient / Family Education for ongoing care 0 []  - Staff obtains , Records, Test Results / Process Orders 0 []  - Staff telephones HHA, Nursing Homes / Clarify orders / etc 0 []  - Routine Transfer to another Facility (non-emergent condition) 0 [name], [name] ( ) []  - Routine Hospital Admission (non-emergent condition) 0 []  - New Admissions / / Ordering NPWT, Apligraf, etc. 0 []  - Emergency Hospital Admission (emergent condition) 0 []  - Simple Discharge Coordination 0 []  - Complex (extensive) Discharge Coordination 0 PROCESS - Special Needs []  - Pediatric / Minor Patient Management 0 []  - Isolation Patient Management 0 []  - Hearing / Language / Visual special needs 0 []  - Assessment of Community assistance (transportation, D/C planning, etc.) 0 []  - Additional assistance / Altered mentation 0 []  - Support Surface(s) Assessment (bed, cushion, seat, etc.)  0 INTERVENTIONS - Wound Cleansing / Measurement []  - Simple Wound Cleansing - one wound 0 X - Complex Wound Cleansing - multiple wounds 2 5 []  -  Wound Imaging (photographs - any number of wounds) 0 []  - Wound Tracing (instead of photographs) 0 []  - Simple Wound Measurement - one wound 0 X - Complex Wound Measurement - multiple wounds 2 5 INTERVENTIONS - Wound Dressings X - Small Wound Dressing one or multiple wounds 2 10 []  - Medium Wound Dressing one or multiple wounds 0 []  - Large Wound Dressing one or multiple wounds 0 []  - Application of Medications - topical 0 []  - Application of Medications - injection 0 INTERVENTIONS - Miscellaneous []  - External ear exam 0 [name], [name] ([id number]) []  - Specimen Collection (cultures, biopsies, blood, body fluids, etc.) 0 []  - Specimen(s) / Culture(s) sent or taken to Lab for analysis 0 []  - Patient Transfer (multiple staff / Michiel SitesHoyer Lift / Similar devices) 0 []  - Simple Staple / Suture removal (25 or less) 0 []  - Complex Staple / Suture removal (26 or more) 0 []  - Hypo / Hyperglycemic Management (close monitor of Blood Glucose) 0 []  - Ankle / Brachial Index (ABI) - do not check if billed separately 0 X - Vital Signs 1 5 Has the patient been seen at the hospital within the last three years: Yes Total Score: 90 Level Of Care: New/Established - Level 3 Electronic Signature(s) Signed: 08/18/2016 5:31:48 PM By: Elpidio EricAfful, Rita BSN, RN Entered By: Elpidio EricAfful, Rita on 08/18/2016 11:42:12 [name], [name] ([id number]) -------------------------------------------------------------------------------- Encounter Discharge Information Details Patient Name: [name], [name] Date of Service: 08/18/2016 10:45 AM Medical Record Number: [id number] Patient Account Number: [id number] Date of Birth/Sex: 09/14/1930 (82 y.o. Female) Treating RN: Huel CoventryWoody, Kim Primary Care Physician: Aram BeechamSparks, Jeffrey Other Clinician: Referring Physician: Aram BeechamSparks, Jeffrey Treating  Physician/Extender: Rudene ReBritto, Errol Weeks in Treatment: 6 Encounter Discharge Information Items Discharge Pain Level: 0 Discharge Condition: Stable Ambulatory Status: Cane Discharge Destination: Home Transportation: Private Auto Accompanied By: self Schedule Follow-up Appointment: Yes Medication Reconciliation completed Yes and provided to Patient/Care Adrina Armijo: Provided on Clinical Summary of Care: 08/18/2016 Form Type [facility]ipient Paper [facility] Signature(s) Signed: 08/18/2016 11:32:59 AM By: Gwenlyn PerkingMoore, Shelia Entered By: Gwenlyn PerkingMoore, Shelia on 08/18/2016 11:32:59 [name], Charizma V. ([id number]) -------------------------------------------------------------------------------- Lower Extremity Assessment Details Patient Name: [name], [name] Date of Service: 08/18/2016 10:45 AM Medical Record Number: [id number] Patient Account Number: [id number] Date of Birth/Sex: 10/16/1930 (82 y.o. Female) Treating RN: Huel CoventryWoody, Kim Primary Care Physician: Aram BeechamSparks, Jeffrey Other Clinician: Referring Physician: Aram BeechamSparks, Jeffrey Treating Physician/Extender: Rudene ReBritto, Errol Weeks in Treatment: 6 Edema Assessment Assessed: [Left: No] [Right: No] E[Left: dema] [Right: :] Calf Left: Right: Point of Measurement: 24 cm From Medial Instep 30 cm cm Ankle Left: Right: Point of Measurement: 10 cm From Medial Instep 19.8 cm cm Vascular Assessment Claudication: Claudication Assessment [Left:None] Pulses: Posterior Tibial Palpable: [Left:Yes] Dorsalis Pedis Palpable: [Left:Yes] Extremity colors, hair growth, and conditions: Extremity Color: [Left:Hyperpigmented] Hair Growth on Extremity: [Left:Yes] Temperature of Extremity: [Left:Cool] Capillary Refill: [Left:< 3 seconds] Dependent Rubor: [Left:No] Blanched when Elevated: [Left:No] Lipodermatosclerosis: [Left:No] Toe Nail Assessment Left: Right: Thick: No Discolored: No Deformed: No Improper Length and Hygiene: No [name], [name]  ([id number]) Electronic Signature(s) Signed: 08/18/2016 3:26:10 PM By: Elliot GurneyWoody, RN, BSN, Kim RN, BSN Entered By: Elliot GurneyWoody, RN, BSN, Kim on 08/18/2016 11:07:54 [name], Yenesis V. ([id number]) -------------------------------------------------------------------------------- Multi Wound Chart Details Patient Name: [name], [name] Date of Service: 08/18/2016 10:45 AM Medical Record Number: [id number] Patient Account Number: [id number] Date of Birth/Sex: 05/31/1930 (82 y.o. Female) Treating RN: Afful, RN, BSN, [place] Primary Care Physician: Aram BeechamSparks, Jeffrey Other Clinician: Referring Physician: Aram BeechamSparks, Jeffrey Treating Physician/Extender: Meyer RusselBritto,  Errol Weeks in Treatment: 6 Vital Signs Height(in): 60 Pulse(bpm): 62 Weight(lbs): 126 Blood Pressure 148/48 (mmHg): Body Mass Index(BMI): 25 Temperature(F): 97.8 Respiratory Rate 16 (breaths/min): Photos: [N/A:N/A] Wound Location: Left Lower Leg - Medial Left Lower Leg - Medial N/A Wounding Event: Trauma Trauma N/A Primary Etiology: Trauma, Other Trauma, Other N/A Comorbid History: Hypertension, Gout, Hypertension, Gout, N/A Osteoarthritis, Neuropathy Osteoarthritis, Neuropathy Date Acquired: 06/27/2016 08/11/2016 N/A Weeks of Treatment: 6 0 N/A Wound Status: Open Open N/A Clustered Wound: Yes No N/A Clustered Quantity: 2 N/A N/A Measurements L x W x D 3.2x4.5x0.1 0.7x0.7x0.1 N/A (cm) Area (cm) : 11.31 0.385 N/A Volume (cm) : 1.131 0.038 N/A % Reduction in Area: 70.00% N/A N/A % Reduction in Volume: 70.00% N/A N/A Classification: Partial Thickness Partial Thickness N/A Exudate Amount: Medium Medium N/A Exudate Type: Serosanguineous Serosanguineous N/A Exudate Color: red, brown red, brown N/A Wound Margin: Flat and Intact Flat and Intact N/A Granulation Amount: Medium (34-66%) Medium (34-66%) N/A HILA, DEANES ([id number]) Granulation Quality: N/A Red N/A Necrotic Amount: Medium (34-66%) Medium (34-66%) N/A Necrotic Tissue: Adherent  Slough Eschar, Adherent Slough N/A Exposed Structures: Fascia: No Fascia: No N/A Fat: No Fat: No Tendon: No Tendon: No Muscle: No Muscle: No Joint: No Joint: No Bone: No Bone: No Limited to Skin Limited to Skin Breakdown Breakdown Epithelialization: Small (1-33%) Large (67-100%) N/A Periwound Skin Texture: Edema: No Edema: No N/A Excoriation: No Excoriation: No Induration: No Induration: No Callus: No Callus: No Crepitus: No Crepitus: No Fluctuance: No Fluctuance: No Friable: No Friable: No Rash: No Rash: No Scarring: No Scarring: No Periwound Skin Moist: Yes Moist: Yes N/A Moisture: Maceration: No Maceration: No Dry/Scaly: No Dry/Scaly: No Periwound Skin Color: Ecchymosis: Yes Atrophie Blanche: No N/A Atrophie Blanche: No Cyanosis: No Cyanosis: No Ecchymosis: No Erythema: No Erythema: No Hemosiderin Staining: No Hemosiderin Staining: No Mottled: No Mottled: No Pallor: No Pallor: No Rubor: No Rubor: No Temperature: No Abnormality N/A N/A Tenderness on Yes No N/A Palpation: Wound Preparation: Ulcer Cleansing: Ulcer Cleansing: N/A Rinsed/Irrigated with Rinsed/Irrigated with Saline Saline Topical Anesthetic Topical Anesthetic Applied: Other: lidocaine Applied: Other: lidocaine 4% 4% Treatment Notes Electronic Signature(s) Signed: 08/18/2016 5:31:48 PM By: Elpidio Eric BSN, RN Entered By: Elpidio Eric on 08/18/2016 11:21:19 [name] ([id number]) ANEVAEH, GANGI ([id number]) -------------------------------------------------------------------------------- Multi-Disciplinary Care Plan Details Patient Name: [name], [name] Date of Service: 08/18/2016 10:45 AM Medical Record Number: [id number] Patient Account Number: [id number] Date of Birth/Sex: Mar 23, 1930 (82 y.o. Female) Treating RN: Afful, RN, BSN, [place] Primary Care Physician: Aram Beecham Other Clinician: Referring Physician: Aram Beecham Treating Physician/Extender: Rudene Re in Treatment: 6 Active Inactive Abuse / Safety / Falls / Self Care Management Nursing Diagnoses: Potential for falls Goals: Patient will remain injury free Date Initiated: 07/04/2016 Goal Status: Active Interventions: Assess fall risk on admission and as needed Notes: Soft Tissue Infection Nursing Diagnoses: Impaired tissue integrity Potential for infection: soft tissue Goals: Patient will remain free of wound infection Date Initiated: 07/04/2016 Goal Status: Active Interventions: Assess signs and symptoms of infection every visit Notes: Wound/Skin Impairment Nursing Diagnoses: Impaired tissue integrity Goals: Ulcer/skin breakdown will have a volume reduction of 30% by week 4 PRANIKA, KAPLA ([id number]) Date Initiated: 07/04/2016 Goal Status: Active Interventions: Assess ulceration(s) every visit Notes: Electronic Signature(s) Signed: 08/18/2016 5:31:48 PM By: Elpidio Eric BSN, RN Entered By: Elpidio Eric on 08/18/2016 11:20:11 [name] ([id number]) -------------------------------------------------------------------------------- Pain Assessment Details Patient Name: [name] Date of Service: 08/18/2016 10:45 AM Medical Record Number:  [id number] Patient Account Number: [id number] Date of Birth/Sex: 11-07-1930 (82 y.o. Female) Treating RN: Huel Coventry Primary Care Physician: Aram Beecham Other Clinician: Referring Physician: Aram Beecham Treating Physician/Extender: Rudene Re in Treatment: 6 Active Problems Location of Pain Severity and Description of Pain Patient Has Paino No Site Locations With Dressing Change: No Pain Management and Medication Current Pain Management: Electronic Signature(s) Signed: 08/18/2016 3:26:10 PM By: Elliot Gurney, RN, BSN, Kim RN, BSN Entered By: Elliot Gurney, RN, BSN, Kim on 08/18/2016 11:01:36 [name]  ([id number]) -------------------------------------------------------------------------------- Patient/Caregiver Education Details Patient Name: [name] Date of Service: 08/18/2016 10:45 AM Medical Record Number: [id number] Patient Account Number: [id number] Date of Birth/Gender: 1930-05-24 (82 y.o. Female) Treating RN: Huel Coventry Primary Care Physician: Aram Beecham Other Clinician: Referring Physician: Aram Beecham Treating Physician/Extender: Rudene Re in Treatment: 6 Education Assessment Education Provided To: Patient Education Topics Provided Wound/Skin Impairment: Handouts: Caring for Your Ulcer, Other: continue wound care as prescribed Methods: Demonstration Responses: State content correctly Electronic Signature(s) Signed: 08/18/2016 3:26:10 PM By: Elliot Gurney, RN, BSN, Kim RN, BSN Entered By: Elliot Gurney, RN, BSN, Kim on 08/18/2016 11:32:50 [name] ([id number]) -------------------------------------------------------------------------------- Wound Assessment Details Patient Name: [name] Date of Service: 08/18/2016 10:45 AM Medical Record Number: [id number] Patient Account Number: [id number] Date of Birth/Sex: Apr 08, 1930 (82 y.o. Female) Treating RN: Huel Coventry Primary Care Physician: Aram Beecham Other Clinician: Referring Physician: Aram Beecham Treating Physician/Extender: Rudene Re in Treatment: 6 Wound Status Wound Number: 3 Primary Trauma, Other Etiology: Wound Location: Left Lower Leg - Medial Wound Status: Open Wounding Event: Trauma Comorbid Hypertension, Gout, Osteoarthritis, Date Acquired: 06/27/2016 History: Neuropathy Weeks Of Treatment: 6 Clustered Wound: Yes Photos Wound Measurements Length: (cm) 3.2 Width: (cm) 4.5 Depth: (cm) 0.1 Clustered Quantity: 2 Area: (cm) 11.31 Volume: (cm) 1.131 % Reduction in Area: 70% % Reduction in Volume: 70% Epithelialization: Small (1-33%) Tunneling:  No Undermining: No Wound Description Classification: Partial Thickness Wound Margin: Flat and Intact Exudate Amount: Medium Exudate Type: Serosanguineous Exudate Color: red, brown Foul Odor After Cleansing: No Wound Bed Granulation Amount: Medium (34-66%) Exposed Structure Necrotic Amount: Medium (34-66%) Fascia Exposed: No Necrotic Quality: Adherent Slough Fat Layer Exposed: No Tendon Exposed: No [name], [name] ([id number]) Muscle Exposed: No Joint Exposed: No Bone Exposed: No Limited to Skin Breakdown Periwound Skin Texture Texture Color No Abnormalities Noted: No No Abnormalities Noted: No Callus: No Atrophie Blanche: No Crepitus: No Cyanosis: No Excoriation: No Ecchymosis: Yes Fluctuance: No Erythema: No Friable: No Hemosiderin Staining: No Induration: No Mottled: No Localized Edema: No Pallor: No Rash: No Rubor: No Scarring: No Temperature / Pain Moisture Temperature: No Abnormality No Abnormalities Noted: No Tenderness on Palpation: Yes Dry / Scaly: No Maceration: No Moist: Yes Wound Preparation Ulcer Cleansing: Rinsed/Irrigated with Saline Topical Anesthetic Applied: Other: lidocaine 4%, Treatment Notes Wound #3 (Left, Medial Lower Leg) 1. Cleansed with: Clean wound with Normal Saline 2. Anesthetic Topical Lidocaine 4% cream to wound bed prior to debridement 4. Dressing Applied: Hydrafera Blue 5. Secondary Dressing Applied ABD and Kerlix/Conform 7. Secured with Magazine features editor) Signed: 08/18/2016 3:26:10 PM By: Elliot Gurney, RN, BSN, Kim RN, BSN Entered By: Elliot Gurney, RN, BSN, Kim on 08/18/2016 11:12:37 [name] ([id number]) -------------------------------------------------------------------------------- Wound Assessment Details Patient Name: [name] Date of Service: 08/18/2016 10:45 AM Medical Record Number: [id number] Patient Account Number: [id number] Date of Birth/Sex: [birth year] (82 y.o. Female) Treating RN:  Huel Coventry Primary Care Physician: Aram Beecham Other Clinician: Referring Physician: Aram Beecham Treating Physician/Extender: Meyer Russel,  Errol Weeks in Treatment: 6 Wound Status Wound Number: 4 Primary Trauma, Other Etiology: Wound Location: Left Lower Leg - Medial Wound Status: Open Wounding Event: Trauma Comorbid Hypertension, Gout, Osteoarthritis, Date Acquired: 08/11/2016 History: Neuropathy Weeks Of Treatment: 0 Clustered Wound: No Photos Wound Measurements Length: (cm) 0.7 Width: (cm) 0.7 Depth: (cm) 0.1 Area: (cm) 0.385 Volume: (cm) 0.038 % Reduction in Area: % Reduction in Volume: Epithelialization: Large (67-100%) Tunneling: No Undermining: No Wound Description Classification: Partial Thickness Wound Margin: Flat and Intact Exudate Amount: Medium Exudate Type: Serosanguineous Exudate Color: red, brown Wound Bed Granulation Amount: Medium (34-66%) Exposed Structure Granulation Quality: Red Fascia Exposed: No Necrotic Amount: Medium (34-66%) Fat Layer Exposed: No Necrotic Quality: Eschar, Adherent Slough Tendon Exposed: No Muscle Exposed: No KHALESSI, BLOUGH ([id number]) Joint Exposed: No Bone Exposed: No Limited to Skin Breakdown Periwound Skin Texture Texture Color No Abnormalities Noted: No No Abnormalities Noted: No Callus: No Atrophie Blanche: No Crepitus: No Cyanosis: No Excoriation: No Ecchymosis: No Fluctuance: No Erythema: No Friable: No Hemosiderin Staining: No Induration: No Mottled: No Localized Edema: No Pallor: No Rash: No Rubor: No Scarring: No Moisture No Abnormalities Noted: No Dry / Scaly: No Maceration: No Moist: Yes Wound Preparation Ulcer Cleansing: Rinsed/Irrigated with Saline Topical Anesthetic Applied: Other: lidocaine 4%, Treatment Notes Wound #4 (Left, Medial Lower Leg) 1. Cleansed with: Clean wound with Normal Saline 2. Anesthetic Topical Lidocaine 4% cream to wound bed prior to debridement 4.  Dressing Applied: Hydrafera Blue 5. Secondary Dressing Applied ABD and Kerlix/Conform 7. Secured with Magazine features editor) Signed: 08/18/2016 3:26:10 PM By: Elliot Gurney, RN, BSN, Kim RN, BSN Entered By: Elliot Gurney, RN, BSN, Kim on 08/18/2016 11:15:02 [name] ([id number]) -------------------------------------------------------------------------------- Vitals Details Patient Name: [name] Date of Service: 08/18/2016 10:45 AM Medical Record Number: [id number] Patient Account Number: [id number] Date of Birth/Sex: [birth year] (82 y.o. Female) Treating RN: Huel Coventry Primary Care Physician: Aram Beecham Other Clinician: Referring Physician: Aram Beecham Treating Physician/Extender: Rudene Re in Treatment: 6 Vital Signs Time Taken: 11:01 Temperature (F): 97.8 Height (in): 60 Pulse (bpm): 62 Weight (lbs): 126 Respiratory Rate (breaths/min): 16 Body Mass Index (BMI): 24.6 Blood Pressure (mmHg): 148/48 Reference Range: 80 - 120 mg / dl Electronic Signature(s) Signed: 08/18/2016 3:26:10 PM By: Elliot Gurney, RN, BSN, Kim RN, BSN Entered By: Elliot Gurney, RN, BSN, Kim on 08/18/2016 11:01:59

## 2016-08-22 ENCOUNTER — Ambulatory Visit: Payer: Medicare Other | Admitting: Surgery

## 2016-08-25 ENCOUNTER — Encounter: Payer: Medicare Other | Admitting: Surgery

## 2016-08-25 DIAGNOSIS — S81812A Laceration without foreign body, left lower leg, initial encounter: Secondary | ICD-10-CM | POA: Diagnosis not present

## 2016-08-25 NOTE — Progress Notes (Signed)
SHARIFA, BUCHOLZ ([id number]) Visit Report for 08/25/2016 Chief Complaint Document Details Patient Name: [name], [name] 08/25/2016 10:45 Date of Service: AM Medical Record [id number] Number: Patient Account Number: [id number] 1929/12/30 (82 y.o. Treating RN: Phillis Haggis Date of Birth/Sex: Female) Other Clinician: Primary Care Physician: Aram Beecham Treating Evlyn Kanner Referring Physician: Aram Beecham Physician/Extender: Tania Ade in Treatment: 7 Information Obtained from: Patient Chief Complaint Patient seen for complaints of Non-Healing Wound to the left lower extremity for about a week Electronic Signature(s) Signed: 08/25/2016 11:25:10 AM By: Evlyn Kanner MD, FACS Entered By: Evlyn Kanner on 08/25/2016 11:25:09 [name] ([id number]) -------------------------------------------------------------------------------- HPI Details Patient Name: SHANENA, PELLEGRINO 08/25/2016 10:45 Date of Service: AM Medical Record [id number] Number: Patient Account Number: [id number] Feb 04, 1930 (82 y.o. Treating RN: Phillis Haggis Date of Birth/Sex: Female) Other Clinician: Primary Care Physician: Aram Beecham Treating Evlyn Kanner Referring Physician: Aram Beecham Physician/Extender: Tania Ade in Treatment: 7 History of Present Illness Location: left lower extremity Quality: Patient reports experiencing an aching pain to affected area(s). Severity: Patient states wound (s) are getting better. Duration: Patient has had the wound for < 1 weeks prior to presenting for treatment Timing: Pain in wound is Intermittent (comes and goes) Context: The wound occurred when the patient tripped and injured her left lower extremity where she had some skin tears. Modifying Factors: Other treatment(s) tried include: has been applying some Neosporin ointment over this Associated Signs and Symptoms: Patient reports having: no bleeding or discharge from the wound. HPI Description:  82 year old patient who is looking much younger than his stated age comes in with a history of having a laceration to her left lower extremity which she sustained about a week ago. She has several medical comorbidities including degenerative arthritis, scoliosis, history of back surgery, pacemaker placement,AMA positive, ulnar neuropathy and left carpal tunnel syndrome. she is also had sclerotherapy for varicose veins in May 2003. her medications include some prednisone at the present time which she may be coming off soon. She went to the [facility] where they have been dressing her wound and she is hear for review. 08/18/2016 -- a small traumatic ulceration just superior medial to her previous wound and this was caused while she was trying to get her dressing off Electronic Signature(s) Signed: 08/25/2016 11:25:18 AM By: Evlyn Kanner MD, FACS Entered By: Evlyn Kanner on 08/25/2016 11:25:18 [name] ([id number]) -------------------------------------------------------------------------------- Physical Exam Details Patient Name: MORGIN, HALLS 08/25/2016 10:45 Date of Service: AM Medical Record [id number] Number: Patient Account Number: [id number] 10/07/1930 (82 y.o. Treating RN: Phillis Haggis Date of Birth/Sex: Female) Other Clinician: Primary Care Physician: Aram Beecham Treating Evlyn Kanner Referring Physician: Aram Beecham Physician/Extender: Weeks in Treatment: 7 Constitutional . Pulse regular. Respirations normal and unlabored. Afebrile. . Eyes Nonicteric. Reactive to light. Ears, Nose, Mouth, and Throat Lips, teeth, and gums WNL.Marland Kitchen Moist mucosa without lesions. Neck supple and nontender. No palpable supraclavicular or cervical adenopathy. Normal sized without goiter. Respiratory WNL. No retractions.. Cardiovascular Pedal Pulses WNL. No clubbing, cyanosis or edema. Lymphatic No adneopathy. No adenopathy. No adenopathy. Musculoskeletal Adexa without  tenderness or enlargement.. Digits and nails w/o clubbing, cyanosis, infection, petechiae, ischemia, or inflammatory conditions.. Integumentary (Hair, Skin) No suspicious lesions. No crepitus or fluctuance. No peri-wound warmth or erythema. No masses.Marland Kitchen Psychiatric Judgement and insight Intact.. No evidence of depression, anxiety, or agitation.. Notes wound looks excellent and no debridement was required and overall the lymphedema has gone down significantly. Electronic Signature(s) Signed: 08/25/2016 11:26:12 AM By: Evlyn Kanner MD, FACS Entered  By: Evlyn Kanner on 08/25/2016 11:26:10 [name] ([id number]) -------------------------------------------------------------------------------- Physician Orders Details Patient Name: LATOIA, MANGOLD 08/25/2016 10:45 Date of Service: AM Medical Record [id number] Number: Patient Account Number: [id number] 10/23/1930 (82 y.o. Treating RN: Clover Mealy, RN, BSN,  Sink Date of Birth/Sex: Female) Other Clinician: Primary Care Physician: Monia Sabal Referring Physician: Aram Beecham Physician/Extender: Tania Ade in Treatment: 7 Verbal / Phone Orders: Yes Clinician: Afful, RN, BSN, Rita Read Back and Verified: Yes Diagnosis Coding Wound Cleansing Wound #3 Left,Medial Lower Leg o Clean wound with Normal Saline. o May Shower, gently pat wound dry prior to applying new dressing. Anesthetic Wound #3 Left,Medial Lower Leg o Topical Lidocaine 4% cream applied to wound bed prior to debridement Primary Wound Dressing Wound #3 Left,Medial Lower Leg o Hydrafera Blue Wound #4 Left,Proximal Lower Leg o Hydrafera Blue Secondary Dressing o ABD and Kerlix/Conform Wound #3 Left,Medial Lower Leg o ABD and Kerlix/Conform Dressing Change Frequency Wound #3 Left,Medial Lower Leg o Change dressing every other day. Follow-up Appointments Wound #3 Left,Medial Lower Leg o Return Appointment in 1 week. Wound #4  Left,Proximal Lower Leg o Return Appointment in 1 week. Edema Control ZIANNA, PARELLA ([id number]) Wound #3 Left,Medial Lower Leg o Support Garment 30-40 mm/Hg pressure to: Wound #4 Left,Proximal Lower Leg o Support Garment 30-40 mm/Hg pressure to: Additional Orders / Instructions Wound #3 Left,Medial Lower Leg o Increase protein intake. o Activity as tolerated Wound #4 Left,Proximal Lower Leg o Increase protein intake. o Activity as tolerated Electronic Signature(s) Signed: 08/25/2016 12:46:10 PM By: Evlyn Kanner MD, FACS Signed: 08/25/2016 12:49:55 PM By: Elpidio Eric BSN, RN Entered By: Elpidio Eric on 08/25/2016 11:22:53 [name] ([id number]) -------------------------------------------------------------------------------- Problem List Details Patient Name: SYREETA, BRAUTIGAN 08/25/2016 10:45 Date of Service: AM Medical Record [id number] Number: Patient Account Number: [id number] Feb 16, 1930 (82 y.o. Treating RN: Phillis Haggis Date of Birth/Sex: Female) Other Clinician: Primary Care Physician: Aram Beecham Treating Evlyn Kanner Referring Physician: Aram Beecham Physician/Extender: Tania Ade in Treatment: 7 Active Problems ICD-10 Encounter Code Description Active Date Diagnosis S81.812A Laceration without foreign body, left lower leg, initial 07/04/2016 Yes encounter L97.222 Non-pressure chronic ulcer of left calf with fat layer 07/04/2016 Yes exposed Inactive Problems Resolved Problems Electronic Signature(s) Signed: 08/25/2016 11:24:52 AM By: Evlyn Kanner MD, FACS Entered By: Evlyn Kanner on 08/25/2016 11:24:52 [name] ([id number]) -------------------------------------------------------------------------------- Progress Note Details Patient Name: STEVE, SCHEIBLE 08/25/2016 10:45 Date of Service: AM Medical Record [id number] Number: Patient Account Number: [id number] 09-12-1930 (82 y.o. Treating RN: Phillis Haggis Date of  Birth/Sex: Female) Other Clinician: Primary Care Physician: Aram Beecham Treating Evlyn Kanner Referring Physician: Aram Beecham Physician/Extender: Tania Ade in Treatment: 7 Subjective Chief Complaint Information obtained from Patient Patient seen for complaints of Non-Healing Wound to the left lower extremity for about a week History of Present Illness (HPI) The following HPI elements were documented for the patient's wound: Location: left lower extremity Quality: Patient reports experiencing an aching pain to affected area(s). Severity: Patient states wound (s) are getting better. Duration: Patient has had the wound for < 1 weeks prior to presenting for treatment Timing: Pain in wound is Intermittent (comes and goes) Context: The wound occurred when the patient tripped and injured her left lower extremity where she had some skin tears. Modifying Factors: Other treatment(s) tried include: has been applying some Neosporin ointment over this Associated Signs and Symptoms: Patient reports having: no bleeding or discharge from the wound. 82 year old patient who is looking much younger than his stated age comes  in with a history of having a laceration to her left lower extremity which she sustained about a week ago. She has several medical comorbidities including degenerative arthritis, scoliosis, history of back surgery, pacemaker placement,AMA positive, ulnar neuropathy and left carpal tunnel syndrome. she is also had sclerotherapy for varicose veins in May 2003. her medications include some prednisone at the present time which she may be coming off soon. She went to the [facility] where they have been dressing her wound and she is hear for review. 08/18/2016 -- a small traumatic ulceration just superior medial to her previous wound and this was caused while she was trying to get her dressing off Objective Constitutional Pulse regular. Respirations normal and unlabored.  Afebrile. CRISTIN, PENAFLOR VMarland Kitchen ([id number]) Vitals Time Taken: 11:04 AM, Height: 60 in, Weight: 126 lbs, BMI: 24.6, Temperature: 98.0 F, Pulse: 60 bpm, Respiratory Rate: 18 breaths/min, Blood Pressure: 116/52 mmHg. Eyes Nonicteric. Reactive to light. Ears, Nose, Mouth, and Throat Lips, teeth, and gums WNL.Marland Kitchen Moist mucosa without lesions. Neck supple and nontender. No palpable supraclavicular or cervical adenopathy. Normal sized without goiter. Respiratory WNL. No retractions.. Cardiovascular Pedal Pulses WNL. No clubbing, cyanosis or edema. Lymphatic No adneopathy. No adenopathy. No adenopathy. Musculoskeletal Adexa without tenderness or enlargement.. Digits and nails w/o clubbing, cyanosis, infection, petechiae, ischemia, or inflammatory conditions.Marland Kitchen Psychiatric Judgement and insight Intact.. No evidence of depression, anxiety, or agitation.. General Notes: wound looks excellent and no debridement was required and overall the lymphedema has gone down significantly. Integumentary (Hair, Skin) No suspicious lesions. No crepitus or fluctuance. No peri-wound warmth or erythema. No masses.. Wound #3 status is Open. Original cause of wound was Trauma. The wound is located on the Left,Medial Lower Leg. The wound measures 1cm length x 1cm width x 0.1cm depth; 0.785cm^2 area and 0.079cm^3 volume. The wound is limited to skin breakdown. There is no tunneling or undermining noted. There is a large amount of serosanguineous drainage noted. The wound margin is flat and intact. There is small (1-33%) red granulation within the wound bed. There is a small (1-33%) amount of necrotic tissue within the wound bed including Adherent Slough. The periwound skin appearance exhibited: Moist, Ecchymosis. The periwound skin appearance did not exhibit: Callus, Crepitus, Excoriation, Fluctuance, Friable, Induration, Localized Edema, Rash, Scarring, Dry/Scaly, Maceration, Atrophie Blanche, Cyanosis,  Hemosiderin Staining, Mottled, Pallor, Rubor, Erythema. Periwound temperature was noted as No Abnormality. The periwound has tenderness on palpation. Wound #4 status is Open. Original cause of wound was Trauma. The wound is located on the Left,Proximal Lower Leg. The wound measures 0.1cm length x 0.1cm width x 0.1cm depth; 0.008cm^2 area and 0.001cm^3 volume. The wound is limited to skin breakdown. There is no tunneling or undermining noted. FANY, CAVANAUGH VMarland Kitchen ([id number]) There is a none present amount of drainage noted. The wound margin is flat and intact. There is no granulation within the wound bed. There is a large (67-100%) amount of necrotic tissue within the wound bed including Eschar. The periwound skin appearance exhibited: Moist. The periwound skin appearance did not exhibit: Callus, Crepitus, Excoriation, Fluctuance, Friable, Induration, Localized Edema, Rash, Scarring, Dry/Scaly, Maceration, Atrophie Blanche, Cyanosis, Ecchymosis, Hemosiderin Staining, Mottled, Pallor, Rubor, Erythema. Assessment Active Problems ICD-10 S81.812A - Laceration without foreign body, left lower leg, initial encounter L97.222 - Non-pressure chronic ulcer of left calf with fat layer exposed Plan Wound Cleansing: Wound #3 Left,Medial Lower Leg: Clean wound with Normal Saline. May Shower, gently pat wound dry prior to applying new dressing. Anesthetic: Wound #3 Left,Medial Lower Leg:  Topical Lidocaine 4% cream applied to wound bed prior to debridement Primary Wound Dressing: Wound #3 Left,Medial Lower Leg: Hydrafera Blue Wound #4 Left,Proximal Lower Leg: Hydrafera Blue Secondary Dressing: ABD and Kerlix/Conform Wound #3 Left,Medial Lower Leg: ABD and Kerlix/Conform Dressing Change Frequency: Wound #3 Left,Medial Lower Leg: Change dressing every other day. Follow-up Appointments: Wound #3 Left,Medial Lower Leg: Return Appointment in 1 week. Wound #4 Left,Proximal Lower Leg: Return  Appointment in 1 week. Edema Control: AAREN, MCINTOSH ([id number]) Wound #3 Left,Medial Lower Leg: Support Garment 30-40 mm/Hg pressure to: Wound #4 Left,Proximal Lower Leg: Support Garment 30-40 mm/Hg pressure to: Additional Orders / Instructions: Wound #3 Left,Medial Lower Leg: Increase protein intake. Activity as tolerated Wound #4 Left,Proximal Lower Leg: Increase protein intake. Activity as tolerated We'll use a piece of RTD over this wound and then some foam and use her dural layer compression stockings to manage her lymphedema. She will come back and see me next week Electronic Signature(s) Signed: 08/25/2016 11:26:55 AM By: Evlyn Kanner MD, FACS Entered By: Evlyn Kanner on 08/25/2016 11:26:55 [name] ([id number]) -------------------------------------------------------------------------------- SuperBill Details Patient Name: [name] Date of Service: 08/25/2016 Medical Record Number: [id number] Patient Account Number: [id number] Date of Birth/Sex: 1930-05-05 (82 y.o. Female) Treating RN: Ashok Cordia, Debi Primary Care Physician: Aram Beecham Other Clinician: Referring Physician: Aram Beecham Treating Physician/Extender: Rudene Re in Treatment: 7 Diagnosis Coding ICD-10 Codes Code Description [phone] Laceration without foreign body, left lower leg, initial encounter L97.222 Non-pressure chronic ulcer of left calf with fat layer exposed Facility Procedures CPT4 Code: 72094709 Description: 99213 - WOUND CARE VISIT-LEV 3 EST PT Modifier: Quantity: 1 Physician Procedures CPT4 Code Description: [id number] 99213 - WC PHYS LEVEL 3 - EST PT ICD-10 Description Diagnosis S81.812A Laceration without foreign body, left lower leg, ini L97.222 Non-pressure chronic ulcer of left calf with fat lay Modifier: tial encounter er exposed Quantity: 1 Electronic Signature(s) Signed: 08/25/2016 11:42:49 AM By: Elpidio Eric BSN, RN Signed: 08/25/2016 12:46:10 PM  By: Evlyn Kanner MD, FACS Previous Signature: 08/25/2016 11:27:11 AM Version By: Evlyn Kanner MD, FACS Entered By: Elpidio Eric on 08/25/2016 11:42:49

## 2016-08-27 NOTE — Progress Notes (Signed)
THAILA, BOTTOMS ([id number]) Visit Report for 08/25/2016 Arrival Information Details Patient Name: [name], [name] Date of Service: 08/25/2016 10:45 AM Medical Record Number: [id number] Patient Account Number: [id number] Date of Birth/Sex: [birth year] (82 y.o. Female) Treating RN: Ashok Cordia, Debi Primary Care Physician: Aram Beecham Other Clinician: Referring Physician: Aram Beecham Treating Physician/Extender: Rudene Re in Treatment: 7 Visit Information History Since Last Visit All ordered tests and consults were completed: No Patient Arrived: Gilmer Mor Added or deleted any medications: No Arrival Time: 11:02 Any new allergies or adverse reactions: No Accompanied By: self Had a fall or experienced change in No Transfer Assistance: None activities of daily living that may affect Patient Identification Verified: Yes risk of falls: Secondary Verification Process Completed: Yes Signs or symptoms of abuse/neglect since last No Patient Requires Transmission-Based No visito Precautions: Hospitalized since last visit: No Patient Has Alerts: No Pain Present Now: No Electronic Signature(s) Signed: 08/26/2016 4:43:58 PM By: Alejandro Mulling Entered By: Alejandro Mulling on 08/25/2016 11:02:47 [name] ([id number]) -------------------------------------------------------------------------------- Clinic Level of Care Assessment Details Patient Name: [name] Date of Service: 08/25/2016 10:45 AM Medical Record Number: [id number] Patient Account Number: [id number] Date of Birth/Sex: 04-14-1930 (82 y.o. Female) Treating RN: Afful, RN, BSN, Rita Primary Care Physician: Aram Beecham Other Clinician: Referring Physician: Aram Beecham Treating Physician/Extender: Rudene Re in Treatment: 7 Clinic Level of Care Assessment Items TOOL 4 Quantity Score []  - Use when only an EandM is performed on FOLLOW-UP visit 0 ASSESSMENTS - Nursing Assessment /  Reassessment X - Reassessment of Co-morbidities (includes updates in patient status) 1 10 X - Reassessment of Adherence to Treatment Plan 1 5 ASSESSMENTS - Wound and Skin Assessment / Reassessment X - Simple Wound Assessment / Reassessment - one wound 1 5 []  - Complex Wound Assessment / Reassessment - multiple wounds 0 []  - Dermatologic / Skin Assessment (not related to wound area) 0 ASSESSMENTS - Focused Assessment []  - Circumferential Edema Measurements - multi extremities 0 []  - Nutritional Assessment / Counseling / Intervention 0 X - Lower Extremity Assessment (monofilament, tuning fork, pulses) 1 5 []  - Peripheral Arterial Disease Assessment (using hand held doppler) 0 ASSESSMENTS - Ostomy and/or Continence Assessment and Care []  - Incontinence Assessment and Management 0 []  - Ostomy Care Assessment and Management (repouching, etc.) 0 PROCESS - Coordination of Care X - Simple Patient / Family Education for ongoing care 1 15 []  - Complex (extensive) Patient / Family Education for ongoing care 0 []  - Staff obtains , Records, Test Results / Process Orders 0 []  - Staff telephones HHA, Nursing Homes / Clarify orders / etc 0 []  - Routine Transfer to another Facility (non-emergent condition) 0 ANABELLA, CAPSHAW ( ) []  - Routine Hospital Admission (non-emergent condition) 0 []  - New Admissions / / Ordering NPWT, Apligraf, etc. 0 []  - Emergency Hospital Admission (emergent condition) 0 []  - Simple Discharge Coordination 0 []  - Complex (extensive) Discharge Coordination 0 PROCESS - Special Needs []  - Pediatric / Minor Patient Management 0 []  - Isolation Patient Management 0 []  - Hearing / Language / Visual special needs 0 []  - Assessment of Community assistance (transportation, D/C planning, etc.) 0 []  - Additional assistance / Altered mentation 0 []  - Support Surface(s) Assessment (bed, cushion, seat, etc.) 0 INTERVENTIONS - Wound Cleansing /  Measurement []  - Simple Wound Cleansing - one wound 0 X - Complex Wound Cleansing - multiple wounds 2 5 X - Wound Imaging (photographs - any number of wounds) 1 5 []  -  Wound Tracing (instead of photographs) 0 []  - Simple Wound Measurement - one wound 0 X - Complex Wound Measurement - multiple wounds 2 5 INTERVENTIONS - Wound Dressings X - Small Wound Dressing one or multiple wounds 2 10 []  - Medium Wound Dressing one or multiple wounds 0 []  - Large Wound Dressing one or multiple wounds 0 []  - Application of Medications - topical 0 []  - Application of Medications - injection 0 INTERVENTIONS - Miscellaneous []  - External ear exam 0 MELIA, HOPES ( ) []  - Specimen Collection (cultures, biopsies, blood, body fluids, etc.) 0 []  - Specimen(s) / Culture(s) sent or taken to Lab for analysis 0 []  - Patient Transfer (multiple staff / Lift / Similar devices) 0 []  - Simple Staple / Suture removal (25 or less) 0 []  - Complex Staple / Suture removal (26 or more) 0 []  - Hypo / Hyperglycemic Management (close monitor of Blood Glucose) 0 []  - Ankle / Brachial Index (ABI) - do not check if billed separately 0 X - Vital Signs 1 5 Has the patient been seen at the hospital within the last three years: Yes Total Score: 90 Level Of Care: New/Established - Level 3 Electronic Signature(s) Signed: 08/25/2016 12:49:55 PM By: BSN, RN Entered By: on 08/25/2016 11:42:32 [id number] ( ) -------------------------------------------------------------------------------- Encounter Discharge Information Details Patient Name: Date of Service: 08/25/2016 10:45 AM Medical Record Number: Michiel Sites Patient Account Number: Date of Birth/Sex: [birth year] (82 y.o. Female) Treating RN: Primary Care Physician: 08/27/2016 Other Clinician: Referring Physician: Elpidio Eric Treating Physician/Extender: Elpidio Eric  in Treatment: 7 Encounter Discharge Information Items Discharge Pain Level: 0 Discharge Condition: Stable Ambulatory Status: Cane Discharge Destination: Home Transportation: Private Auto Accompanied By: self Schedule Follow-up Appointment: Yes Medication Reconciliation completed Yes and provided to Patient/Care Caryl Fate: Provided on Clinical Summary of Care: 08/25/2016 Form Type Recipient Paper Patient [facility] Electronic Signature(s) Signed: 08/25/2016 11:30:39 AM By: [name] Entered By: 08/27/2016 on 08/25/2016 11:30:39 [id number] (13/05/1930) -------------------------------------------------------------------------------- Lower Extremity Assessment Details Patient Name: 01-21-1990 Date of Service: 08/25/2016 10:45 AM Medical Record Number: Aram Beecham Patient Account Number: Aram Beecham Date of Birth/Sex: 09/27/1930 (82 y.o. Female) Treating RN: [facility], Debi Primary Care Physician: 08/27/2016 Other Clinician: Referring Physician: Gwenlyn Perking Treating Physician/Extender: Gwenlyn Perking in Treatment: 7 Vascular Assessment Pulses: Posterior Tibial Dorsalis Pedis Palpable: [Left:Yes] Extremity colors, hair growth, and conditions: Extremity Color: [Left:Hyperpigmented] Temperature of Extremity: [Left:Warm] Capillary Refill: [Left:< 3 seconds] Toe Nail Assessment Left: Right: Thick: No Discolored: No Deformed: No Improper Length and Hygiene: No Electronic Signature(s) Signed: 08/26/2016 4:43:58 PM By: [name] Entered By: [id number] on 08/25/2016 11:05:31 08/27/2016 ([id number]) -------------------------------------------------------------------------------- Multi Wound Chart Details Patient Name: [id number] Date of Service: 08/25/2016 10:45 AM Medical Record Number: 01-21-1990 Patient Account Number: Ashok Cordia Date of Birth/Sex: [birth year] (82 y.o. Female) Treating RN: Rudene Re, RN, BSN, 08/28/2016 Primary Care Physician:  Alejandro Mulling Other Clinician: Referring Physician: Alejandro Mulling Treating Physician/Extender: 08/27/2016 in Treatment: 7 Vital Signs Height(in): 60 Pulse(bpm): 60 Weight(lbs): 126 Blood Pressure 116/52 (mmHg): Body Mass Index(BMI): 25 Temperature(F): 98.0 Respiratory Rate 18 (breaths/min): Photos: [3:No Photos] [4:No Photos] [N/A:N/A] Wound Location: [3:Left Lower Leg - Medial Left Lower Leg - Proximal N/A] Wounding Event: [3:Trauma] [4:Trauma] [N/A:N/A] Primary Etiology: [3:Trauma, Other] [4:Trauma, Other] [N/A:N/A] Comorbid History: [3:Hypertension, Gout, Osteoarthritis, Neuropathy Osteoarthritis, Neuropathy] [4:Hypertension, Gout,] [N/A:N/A] Date Acquired: [3:06/27/2016] [4:08/11/2016] [N/A:N/A] Weeks of Treatment: [3:7] [4:1] [N/A:N/A]  Wound Status: [3:Open] [4:Open] [N/A:N/A] Clustered Wound: [3:Yes] [4:No] [N/A:N/A] Clustered Quantity: [3:2] [4:N/A] [N/A:N/A] Measurements L x W x D 1x1x0.1 [4:0.1x0.1x0.1] [N/A:N/A] (cm) Area (cm) : [3:0.785] [4:0.008] [N/A:N/A] Volume (cm) : [3:0.079] [4:0.001] [N/A:N/A] % Reduction in Area: [3:97.90%] [4:97.90%] [N/A:N/A] % Reduction in Volume: 97.90% [4:97.40%] [N/A:N/A] Classification: [3:Partial Thickness] [4:Partial Thickness] [N/A:N/A] Exudate Amount: [3:Large] [4:None Present] [N/A:N/A] Exudate Type: [3:Serosanguineous] [4:N/A] [N/A:N/A] Exudate Color: [3:red, brown] [4:N/A] [N/A:N/A] Wound Margin: [3:Flat and Intact] [4:Flat and Intact] [N/A:N/A] Granulation Amount: [3:Small (1-33%)] [4:None Present (0%)] [N/A:N/A] Granulation Quality: [3:Red] [4:N/A] [N/A:N/A] Necrotic Amount: [3:Small (1-33%)] [4:Large (67-100%)] [N/A:N/A] Necrotic Tissue: [3:Adherent Slough] [4:Eschar] [N/A:N/A] Exposed Structures: [3:Fascia: No Fat: No Tendon: No] [4:Fascia: No Fat: No Tendon: No] [N/A:N/A] Muscle: No Muscle: No Joint: No Joint: No Bone: No Bone: No Limited to Skin Limited to Skin Breakdown  Breakdown Epithelialization: Small (1-33%) Large (67-100%) N/A Periwound Skin Texture: Edema: No Edema: No N/A Excoriation: No Excoriation: No Induration: No Induration: No Callus: No Callus: No Crepitus: No Crepitus: No Fluctuance: No Fluctuance: No Friable: No Friable: No Rash: No Rash: No Scarring: No Scarring: No Periwound Skin Moist: Yes Moist: Yes N/A Moisture: Maceration: No Maceration: No Dry/Scaly: No Dry/Scaly: No Periwound Skin Color: Ecchymosis: Yes Atrophie Blanche: No N/A Atrophie Blanche: No Cyanosis: No Cyanosis: No Ecchymosis: No Erythema: No Erythema: No Hemosiderin Staining: No Hemosiderin Staining: No Mottled: No Mottled: No Pallor: No Pallor: No Rubor: No Rubor: No Temperature: No Abnormality N/A N/A Tenderness on Yes No N/A Palpation: Wound Preparation: Ulcer Cleansing: Ulcer Cleansing: N/A Rinsed/Irrigated with Rinsed/Irrigated with Saline Saline Topical Anesthetic Topical Anesthetic Applied: Other: lidocaine Applied: Other: lidocaine 4% 4% Treatment Notes Electronic Signature(s) Signed: 08/25/2016 12:49:55 PM By: Elpidio Eric BSN, RN Entered By: Elpidio Eric on 08/25/2016 11:19:44 [name] ([id number]) -------------------------------------------------------------------------------- Multi-Disciplinary Care Plan Details Patient Name: [name] Date of Service: 08/25/2016 10:45 AM Medical Record Number: [id number] Patient Account Number: [id number] Date of Birth/Sex: 1930-03-23 (82 y.o. Female) Treating RN: Afful, RN, BSN,  Sink Primary Care Physician: Aram Beecham Other Clinician: Referring Physician: Aram Beecham Treating Physician/Extender: Rudene Re in Treatment: 7 Active Inactive Abuse / Safety / Falls / Self Care Management Nursing Diagnoses: Potential for falls Goals: Patient will remain injury free Date Initiated: 07/04/2016 Goal Status: Active Interventions: Assess fall risk on admission  and as needed Notes: Soft Tissue Infection Nursing Diagnoses: Impaired tissue integrity Potential for infection: soft tissue Goals: Patient will remain free of wound infection Date Initiated: 07/04/2016 Goal Status: Active Interventions: Assess signs and symptoms of infection every visit Notes: Wound/Skin Impairment Nursing Diagnoses: Impaired tissue integrity Goals: Ulcer/skin breakdown will have a volume reduction of 30% by week 4 ANGELO, CAROLL ([id number]) Date Initiated: 07/04/2016 Goal Status: Active Interventions: Assess ulceration(s) every visit Notes: Electronic Signature(s) Signed: 08/25/2016 12:49:55 PM By: Elpidio Eric BSN, RN Entered By: Elpidio Eric on 08/25/2016 11:19:36 [name] ([id number]) -------------------------------------------------------------------------------- Pain Assessment Details Patient Name: [name] Date of Service: 08/25/2016 10:45 AM Medical Record Number: [id number] Patient Account Number: [id number] Date of Birth/Sex: 09/01/1930 (82 y.o. Female) Treating RN: Phillis Haggis Primary Care Physician: Aram Beecham Other Clinician: Referring Physician: Aram Beecham Treating Physician/Extender: Rudene Re in Treatment: 7 Active Problems Location of Pain Severity and Description of Pain Patient Has Paino No Site Locations With Dressing Change: No Pain Management and Medication Current Pain Management: Electronic Signature(s) Signed: 08/26/2016 4:43:58 PM By: Alejandro Mulling Entered By: Alejandro Mulling on 08/25/2016 11:02:53 [name] ([id number]) -------------------------------------------------------------------------------- Patient/Caregiver Education  Details Patient Name: ARIAL, BUBEL. Date of Service: 08/25/2016 10:45 AM Medical Record Number: [id number] Patient Account Number: [id number] Date of Birth/Gender: 03/11/1930 (82 y.o. Female) Treating RN: Phillis Haggis Primary Care Physician:  Aram Beecham Other Clinician: Referring Physician: Aram Beecham Treating Physician/Extender: Rudene Re in Treatment: 7 Education Assessment Education Provided To: Patient Education Topics Provided Wound/Skin Impairment: Handouts: Other: change dressing as ordered Methods: Demonstration, Explain/Verbal Responses: State content correctly Electronic Signature(s) Signed: 08/26/2016 4:43:58 PM By: Alejandro Mulling Entered By: Alejandro Mulling on 08/25/2016 11:29:26 [name] ([id number]) -------------------------------------------------------------------------------- Wound Assessment Details Patient Name: [name] Date of Service: 08/25/2016 10:45 AM Medical Record Number: [id number] Patient Account Number: [id number] Date of Birth/Sex: 10-Jun-1930 (82 y.o. Female) Treating RN: Ashok Cordia, Debi Primary Care Physician: Aram Beecham Other Clinician: Referring Physician: Aram Beecham Treating Physician/Extender: Rudene Re in Treatment: 7 Wound Status Wound Number: 3 Primary Trauma, Other Etiology: Wound Location: Left Lower Leg - Medial Wound Status: Open Wounding Event: Trauma Comorbid Hypertension, Gout, Osteoarthritis, Date Acquired: 06/27/2016 History: Neuropathy Weeks Of Treatment: 7 Clustered Wound: Yes Photos Photo Uploaded By: Alejandro Mulling on 08/25/2016 11:36:11 Wound Measurements Length: (cm) 1 Width: (cm) 1 Depth: (cm) 0.1 Clustered Quantity: 2 Area: (cm) 0.785 Volume: (cm) 0.079 % Reduction in Area: 97.9% % Reduction in Volume: 97.9% Epithelialization: Small (1-33%) Tunneling: No Undermining: No Wound Description Classification: Partial Thickness Wound Margin: Flat and Intact Exudate Amount: Large Exudate Type: Serosanguineous Exudate Color: red, brown Foul Odor After Cleansing: No Wound Bed Granulation Amount: Small (1-33%) Exposed Structure Granulation Quality: Red Fascia Exposed: No Necrotic Amount:  Small (1-33%) Fat Layer Exposed: No CONNELLY, ARAMBURU ([id number]) Necrotic Quality: Adherent Slough Tendon Exposed: No Muscle Exposed: No Joint Exposed: No Bone Exposed: No Limited to Skin Breakdown Periwound Skin Texture Texture Color No Abnormalities Noted: No No Abnormalities Noted: No Callus: No Atrophie Blanche: No Crepitus: No Cyanosis: No Excoriation: No Ecchymosis: Yes Fluctuance: No Erythema: No Friable: No Hemosiderin Staining: No Induration: No Mottled: No Localized Edema: No Pallor: No Rash: No Rubor: No Scarring: No Temperature / Pain Moisture Temperature: No Abnormality No Abnormalities Noted: No Tenderness on Palpation: Yes Dry / Scaly: No Maceration: No Moist: Yes Wound Preparation Ulcer Cleansing: Rinsed/Irrigated with Saline Topical Anesthetic Applied: Other: lidocaine 4%, Treatment Notes Wound #3 (Left, Medial Lower Leg) 1. Cleansed with: Clean wound with Normal Saline 2. Anesthetic Topical Lidocaine 4% cream to wound bed prior to debridement 4. Dressing Applied: Hydrafera Blue 5. Secondary Dressing Applied Dry Gauze Kerlix/Conform 7. Secured with Secretary/administrator) Signed: 08/26/2016 4:43:58 PM By: Alejandro Mulling Entered By: Alejandro Mulling on 08/25/2016 11:12:04 [name] ([id number]) [address], [place] ([id number]) -------------------------------------------------------------------------------- Wound Assessment Details Patient Name: [name] Date of Service: 08/25/2016 10:45 AM Medical Record Number: [id number] Patient Account Number: [id number] Date of Birth/Sex: Nov 29, 1930 (82 y.o. Female) Treating RN: Ashok Cordia, Debi Primary Care Physician: Aram Beecham Other Clinician: Referring Physician: Aram Beecham Treating Physician/Extender: Rudene Re in Treatment: 7 Wound Status Wound Number: 4 Primary Trauma, Other Etiology: Wound Location: Left Lower Leg - Proximal Wound Status:  Open Wounding Event: Trauma Comorbid Hypertension, Gout, Osteoarthritis, Date Acquired: 08/11/2016 History: Neuropathy Weeks Of Treatment: 1 Clustered Wound: No Photos Photo Uploaded By: Alejandro Mulling on 08/25/2016 11:36:12 Wound Measurements Length: (cm) 0.1 Width: (cm) 0.1 Depth: (cm) 0.1 Area: (cm) 0.008 Volume: (cm) 0.001 % Reduction in Area: 97.9% % Reduction in Volume: 97.4% Epithelialization: Large (67-100%) Tunneling: No Undermining: No Wound Description Classification: Partial Thickness Wound Margin:  Flat and Intact Exudate Amount: None Present Wound Bed Granulation Amount: None Present (0%) Exposed Structure Necrotic Amount: Large (67-100%) Fascia Exposed: No Necrotic Quality: Eschar Fat Layer Exposed: No Tendon Exposed: No Muscle Exposed: No Joint Exposed: No [name], Pariss V. ([id number]) Bone Exposed: No Limited to Skin Breakdown Periwound Skin Texture Texture Color No Abnormalities Noted: No No Abnormalities Noted: No Callus: No Atrophie Blanche: No Crepitus: No Cyanosis: No Excoriation: No Ecchymosis: No Fluctuance: No Erythema: No Friable: No Hemosiderin Staining: No Induration: No Mottled: No Localized Edema: No Pallor: No Rash: No Rubor: No Scarring: No Moisture No Abnormalities Noted: No Dry / Scaly: No Maceration: No Moist: Yes Wound Preparation Ulcer Cleansing: Rinsed/Irrigated with Saline Topical Anesthetic Applied: Other: lidocaine 4%, Treatment Notes Wound #4 (Left, Proximal Lower Leg) 1. Cleansed with: Clean wound with Normal Saline 2. Anesthetic Topical Lidocaine 4% cream to wound bed prior to debridement 4. Dressing Applied: Hydrafera Blue 5. Secondary Dressing Applied Dry Gauze Kerlix/Conform 7. Secured with Secretary/administratorTape Electronic Signature(s) Signed: 08/26/2016 4:43:58 PM By: Alejandro MullingPinkerton, Debra Entered By: Alejandro MullingPinkerton, Debra on 08/25/2016 11:12:25 [name], Shamon V.  ([id number]) -------------------------------------------------------------------------------- Vitals Details Patient Name: [name], Portia V. Date of Service: 08/25/2016 10:45 AM Medical Record Number: [id number] Patient Account Number: [id number] Date of Birth/Sex: 11/03/1930 (82 y.o. Female) Treating RN: Ashok CordiaPinkerton, Debi Primary Care Physician: Aram BeechamSparks, Jeffrey Other Clinician: Referring Physician: Aram BeechamSparks, Jeffrey Treating Physician/Extender: Rudene ReBritto, Errol Weeks in Treatment: 7 Vital Signs Time Taken: 11:04 Temperature (F): 98.0 Height (in): 60 Pulse (bpm): 60 Weight (lbs): 126 Respiratory Rate (breaths/min): 18 Body Mass Index (BMI): 24.6 Blood Pressure (mmHg): 116/52 Reference Range: 80 - 120 mg / dl Electronic Signature(s) Signed: 08/26/2016 4:43:58 PM By: Alejandro MullingPinkerton, Debra Entered By: Alejandro MullingPinkerton, Debra on 08/25/2016 11:04:35

## 2016-09-01 ENCOUNTER — Encounter: Payer: Medicare Other | Admitting: Surgery

## 2016-09-01 DIAGNOSIS — S81812A Laceration without foreign body, left lower leg, initial encounter: Secondary | ICD-10-CM | POA: Diagnosis not present

## 2016-09-02 ENCOUNTER — Other Ambulatory Visit: Payer: Self-pay | Admitting: Internal Medicine

## 2016-09-02 DIAGNOSIS — Z1231 Encounter for screening mammogram for malignant neoplasm of breast: Secondary | ICD-10-CM

## 2016-09-02 NOTE — Progress Notes (Signed)
[name], [name] ([id number]) Visit Report for 09/01/2016 Arrival Information Details Patient Name: [name], [name]. Date of Service: 09/01/2016 12:45 PM Medical Record Number: [id number] Patient Account Number: [id number] Date of Birth/Sex: 14-Apr-1930 (82 y.o. Female) Treating RN: Huel Coventry Primary Care Physician: Aram Beecham Other Clinician: Referring Physician: Aram Beecham Treating Physician/Extender: Rudene Re in Treatment: 8 Visit Information History Since Last Visit Added or deleted any medications: Yes Patient Arrived: Ambulatory Any new allergies or adverse reactions: No Arrival Time: 12:55 Had a fall or experienced change in No Accompanied By: self activities of daily living that may affect Transfer Assistance: None risk of falls: Patient Identification Verified: Yes Signs or symptoms of abuse/neglect since last No Secondary Verification Process Yes visito Completed: Hospitalized since last visit: No Patient Requires Transmission-Based No Has Dressing in Place as Prescribed: Yes Precautions: Pain Present Now: No Patient Has Alerts: No Electronic Signature(s) Signed: 09/02/2016 10:50:50 AM By: Elliot Gurney, RN, BSN, Kim RN, BSN Entered By: Elliot Gurney, RN, BSN, Kim on 09/01/2016 12:55:44 [name] ([id number]) -------------------------------------------------------------------------------- Clinic Level of Care Assessment Details Patient Name: [name] Date of Service: 09/01/2016 12:45 PM Medical Record Number: [id number] Patient Account Number: [id number] Date of Birth/Sex: [birth year] (82 y.o. Female) Treating RN: Huel Coventry Primary Care Physician: Aram Beecham Other Clinician: Referring Physician: Aram Beecham Treating Physician/Extender: Rudene Re in Treatment: 8 Clinic Level of Care Assessment Items TOOL 4 Quantity Score []  - Use when only an EandM is performed on FOLLOW-UP visit 0 ASSESSMENTS - Nursing Assessment /  Reassessment []  - Reassessment of Co-morbidities (includes updates in patient status) 0 X - Reassessment of Adherence to Treatment Plan 1 5 ASSESSMENTS - Wound and Skin Assessment / Reassessment X - Simple Wound Assessment / Reassessment - one wound 1 5 []  - Complex Wound Assessment / Reassessment - multiple wounds 0 []  - Dermatologic / Skin Assessment (not related to wound area) 0 ASSESSMENTS - Focused Assessment []  - Circumferential Edema Measurements - multi extremities 0 []  - Nutritional Assessment / Counseling / Intervention 0 []  - Lower Extremity Assessment (monofilament, tuning fork, pulses) 0 []  - Peripheral Arterial Disease Assessment (using hand held doppler) 0 ASSESSMENTS - Ostomy and/or Continence Assessment and Care []  - Incontinence Assessment and Management 0 []  - Ostomy Care Assessment and Management (repouching, etc.) 0 PROCESS - Coordination of Care X - Simple Patient / Family Education for ongoing care 1 15 []  - Complex (extensive) Patient / Family Education for ongoing care 0 X - Staff obtains Chiropractor, Records, Test Results / Process Orders 1 10 []  - Staff telephones HHA, Nursing Homes / Clarify orders / etc 0 []  - Routine Transfer to another Facility (non-emergent condition) 0 [name], [name] ([id number]) []  - Routine Hospital Admission (non-emergent condition) 0 []  - New Admissions / Manufacturing engineer / Ordering NPWT, Apligraf, etc. 0 []  - Emergency Hospital Admission (emergent condition) 0 X - Simple Discharge Coordination 1 10 []  - Complex (extensive) Discharge Coordination 0 PROCESS - Special Needs []  - Pediatric / Minor Patient Management 0 []  - Isolation Patient Management 0 []  - Hearing / Language / Visual special needs 0 []  - Assessment of Community assistance (transportation, D/C planning, etc.) 0 []  - Additional assistance / Altered mentation 0 []  - Support Surface(s) Assessment (bed, cushion, seat, etc.) 0 INTERVENTIONS - Wound Cleansing /  Measurement X - Simple Wound Cleansing - one wound 1 5 []  - Complex Wound Cleansing - multiple wounds 0 X - Wound Imaging (photographs - any number of  wounds) 1 5 []  - Wound Tracing (instead of photographs) 0 X - Simple Wound Measurement - one wound 1 5 []  - Complex Wound Measurement - multiple wounds 0 INTERVENTIONS - Wound Dressings X - Small Wound Dressing one or multiple wounds 1 10 []  - Medium Wound Dressing one or multiple wounds 0 []  - Large Wound Dressing one or multiple wounds 0 []  - Application of Medications - topical 0 []  - Application of Medications - injection 0 INTERVENTIONS - Miscellaneous []  - External ear exam 0 [name], [name] ([id number]) []  - Specimen Collection (cultures, biopsies, blood, body fluids, etc.) 0 []  - Specimen(s) / Culture(s) sent or taken to Lab for analysis 0 []  - Patient Transfer (multiple staff / Michiel Sites Lift / Similar devices) 0 []  - Simple Staple / Suture removal (25 or less) 0 []  - Complex Staple / Suture removal (26 or more) 0 []  - Hypo / Hyperglycemic Management (close monitor of Blood Glucose) 0 []  - Ankle / Brachial Index (ABI) - do not check if billed separately 0 X - Vital Signs 1 5 Has the patient been seen at the hospital within the last three years: Yes Total Score: 75 Level Of Care: New/Established - Level 2 Electronic Signature(s) Signed: 09/02/2016 10:50:50 AM By: Elliot Gurney, RN, BSN, Kim RN, BSN Entered By: Elliot Gurney, RN, BSN, Kim on 09/01/2016 13:18:48 [name] ([id number]) -------------------------------------------------------------------------------- Encounter Discharge Information Details Patient Name: [name] Date of Service: 09/01/2016 12:45 PM Medical Record Number: [id number] Patient Account Number: [id number] Date of Birth/Sex: 03/10/1930 (82 y.o. Female) Treating RN: Huel Coventry Primary Care Physician: Aram Beecham Other Clinician: Referring Physician: Aram Beecham Treating Physician/Extender: Rudene Re in Treatment: 8 Encounter Discharge Information Items Discharge Pain Level: 0 Discharge Condition: Stable Ambulatory Status: Cane Discharge Destination: Home Transportation: Private Auto Accompanied By: self Schedule Follow-up Appointment: Yes Medication Reconciliation completed Yes and provided to Patient/Care Rubi Tooley: Provided on Clinical Summary of Care: 09/01/2016 Form Type Recipient Paper Patient [name] Electronic Signature(s) Signed: 09/02/2016 10:50:50 AM By: Elliot Gurney RN, BSN, Kim RN, BSN Previous Signature: 09/01/2016 1:19:09 PM Version By: Gwenlyn Perking Entered By: Elliot Gurney RN, BSN, Kim on 09/01/2016 13:19:39 [name] ([id number]) -------------------------------------------------------------------------------- Lower Extremity Assessment Details Patient Name: [name] Date of Service: 09/01/2016 12:45 PM Medical Record Number: [id number] Patient Account Number: [id number] Date of Birth/Sex: December 20, 1929 (82 y.o. Female) Treating RN: Huel Coventry Primary Care Physician: Aram Beecham Other Clinician: Referring Physician: Aram Beecham Treating Physician/Extender: Rudene Re in Treatment: 8 Vascular Assessment Pulses: Posterior Tibial Dorsalis Pedis Palpable: [Left:Yes] Extremity colors, hair growth, and conditions: Extremity Color: [Left:Hyperpigmented] Hair Growth on Extremity: [Left:No] Temperature of Extremity: [Left:Warm] Capillary Refill: [Left:< 3 seconds] Dependent Rubor: [Left:No] Blanched when Elevated: [Left:No] Lipodermatosclerosis: [Left:No] Toe Nail Assessment Left: Right: Thick: No Discolored: No Deformed: No Improper Length and Hygiene: No Electronic Signature(s) Signed: 09/02/2016 10:50:50 AM By: Elliot Gurney, RN, BSN, Kim RN, BSN Entered By: Elliot Gurney, RN, BSN, Kim on 09/01/2016 13:02:15 [name] ([id number]) -------------------------------------------------------------------------------- Multi Wound Chart  Details Patient Name: [name] Date of Service: 09/01/2016 12:45 PM Medical Record Number: [id number] Patient Account Number: [id number] Date of Birth/Sex: 05/29/1930 (82 y.o. Female) Treating RN: Huel Coventry Primary Care Physician: Aram Beecham Other Clinician: Referring Physician: Aram Beecham Treating Physician/Extender: Rudene Re in Treatment: 8 Vital Signs Height(in): 60 Pulse(bpm): 63 Weight(lbs): 126 Blood Pressure 150/57 (mmHg): Body Mass Index(BMI): 25 Temperature(F): 98.3 Respiratory Rate 18 (breaths/min): Photos: [N/A:N/A] Wound Location: Left Lower Leg - Medial Left, Proximal  Lower Leg N/A Wounding Event: Trauma Trauma N/A Primary Etiology: Trauma, Other Trauma, Other N/A Comorbid History: Hypertension, Gout, N/A N/A Osteoarthritis, Neuropathy Date Acquired: 06/27/2016 08/11/2016 N/A Weeks of Treatment: 8 2 N/A Wound Status: Open Healed - Epithelialized N/A Clustered Wound: Yes No N/A Clustered Quantity: 2 N/A N/A Measurements L x W x D 0.9x0.8x0.1 0x0x0 N/A (cm) Area (cm) : 0.565 0 N/A Volume (cm) : 0.057 0 N/A % Reduction in Area: 98.50% 100.00% N/A % Reduction in Volume: 98.50% 100.00% N/A Classification: Partial Thickness Partial Thickness N/A Exudate Amount: Large N/A N/A Exudate Type: Serosanguineous N/A N/A Exudate Color: red, brown N/A N/A Wound Margin: Flat and Intact N/A N/A Granulation Amount: Small (1-33%) N/A N/A Granulation Quality: Red, Hyper-granulation N/A N/A Necrotic Amount: Small (1-33%) N/A N/A [name], [name] ([id number]) Exposed Structures: Fascia: No N/A N/A Fat: No Tendon: No Muscle: No Joint: No Bone: No Limited to Skin Breakdown Epithelialization: Medium (34-66%) N/A N/A Periwound Skin Texture: Scarring: Yes No Abnormalities Noted N/A Edema: No Excoriation: No Induration: No Callus: No Crepitus: No Fluctuance: No Friable: No Rash: No Periwound Skin Moist: Yes No Abnormalities Noted  N/A Moisture: Maceration: No Dry/Scaly: No Periwound Skin Color: Ecchymosis: Yes No Abnormalities Noted N/A Atrophie Blanche: No Cyanosis: No Erythema: No Hemosiderin Staining: No Mottled: No Pallor: No Rubor: No Temperature: No Abnormality N/A N/A Tenderness on Yes No N/A Palpation: Wound Preparation: Ulcer Cleansing: N/A N/A Rinsed/Irrigated with Saline Topical Anesthetic Applied: Other: lidocaine 4% Treatment Notes Electronic Signature(s) Signed: 09/02/2016 10:50:50 AM By: Elliot Gurney, RN, BSN, Kim RN, BSN Entered By: Elliot Gurney, RN, BSN, Kim on 09/01/2016 13:05:15 [name] ([id number]) -------------------------------------------------------------------------------- Multi-Disciplinary Care Plan Details Patient Name: [name], [name] Date of Service: 09/01/2016 12:45 PM Medical Record Number: [id number] Patient Account Number: [id number] Date of Birth/Sex: [birth year] (82 y.o. Female) Treating RN: Huel Coventry Primary Care Physician: Aram Beecham Other Clinician: Referring Physician: Aram Beecham Treating Physician/Extender: Rudene Re in Treatment: 8 Active Inactive Abuse / Safety / Falls / Self Care Management Nursing Diagnoses: Potential for falls Goals: Patient will remain injury free Date Initiated: 07/04/2016 Goal Status: Active Interventions: Assess fall risk on admission and as needed Notes: Soft Tissue Infection Nursing Diagnoses: Impaired tissue integrity Potential for infection: soft tissue Goals: Patient will remain free of wound infection Date Initiated: 07/04/2016 Goal Status: Active Interventions: Assess signs and symptoms of infection every visit Notes: Wound/Skin Impairment Nursing Diagnoses: Impaired tissue integrity Goals: Ulcer/skin breakdown will have a volume reduction of 30% by week 4 [name], [name] ([id number]) Date Initiated: 07/04/2016 Goal Status: Active Interventions: Assess ulceration(s) every  visit Notes: Electronic Signature(s) Signed: 09/02/2016 10:50:50 AM By: Elliot Gurney, RN, BSN, Kim RN, BSN Entered By: Elliot Gurney, RN, BSN, Kim on 09/01/2016 13:05:03 [name] ([id number]) -------------------------------------------------------------------------------- Pain Assessment Details Patient Name: [name] Date of Service: 09/01/2016 12:45 PM Medical Record Number: [id number] Patient Account Number: [id number] Date of Birth/Sex: 22-Jun-1930 (82 y.o. Female) Treating RN: Huel Coventry Primary Care Physician: Aram Beecham Other Clinician: Referring Physician: Aram Beecham Treating Physician/Extender: Rudene Re in Treatment: 8 Active Problems Location of Pain Severity and Description of Pain Patient Has Paino No Site Locations With Dressing Change: No Pain Management and Medication Current Pain Management: Electronic Signature(s) Signed: 09/02/2016 10:50:50 AM By: Elliot Gurney, RN, BSN, Kim RN, BSN Entered By: Elliot Gurney, RN, BSN, Kim on 09/01/2016 12:57:13 [name] ([id number]) -------------------------------------------------------------------------------- Patient/Caregiver Education Details Patient Name: [name] Date of Service: 09/01/2016 12:45 PM Medical Record Number: [id number] Patient Account  Number: [id number] Date of Birth/Gender: 01/17/1930 (82 y.o. Female) Treating RN: Huel CoventryWoody, Kim Primary Care Physician: Aram BeechamSparks, Jeffrey Other Clinician: Referring Physician: Aram BeechamSparks, Jeffrey Treating Physician/Extender: Rudene ReBritto, Errol Weeks in Treatment: 8 Education Assessment Education Provided To: Patient Education Topics Provided Venous: Handouts: Controlling Swelling with Compression Stockings Methods: Demonstration Responses: State content correctly Electronic Signature(s) Signed: 09/02/2016 10:50:50 AM By: Elliot GurneyWoody, RN, BSN, Kim RN, BSN Entered By: Elliot GurneyWoody, RN, BSN, Kim on 09/01/2016 13:19:52 [name], [name]  ([id number]) -------------------------------------------------------------------------------- Wound Assessment Details Patient Name: [name], [name] Date of Service: 09/01/2016 12:45 PM Medical Record Number: [id number] Patient Account Number: [id number] Date of Birth/Sex: 06/13/1930 (82 y.o. Female) Treating RN: Huel CoventryWoody, Kim Primary Care Physician: Aram BeechamSparks, Jeffrey Other Clinician: Referring Physician: Aram BeechamSparks, Jeffrey Treating Physician/Extender: Rudene ReBritto, Errol Weeks in Treatment: 8 Wound Status Wound Number: 3 Primary Trauma, Other Etiology: Wound Location: Left Lower Leg - Medial Wound Status: Open Wounding Event: Trauma Comorbid Hypertension, Gout, Osteoarthritis, Date Acquired: 06/27/2016 History: Neuropathy Weeks Of Treatment: 8 Clustered Wound: Yes Photos Wound Measurements Length: (cm) 0.9 Width: (cm) 0.8 Depth: (cm) 0.1 Clustered Quantity: 2 Area: (cm) 0.565 Volume: (cm) 0.057 % Reduction in Area: 98.5% % Reduction in Volume: 98.5% Epithelialization: Medium (34-66%) Tunneling: No Undermining: No Wound Description Classification: Partial Thickness Wound Margin: Flat and Intact Exudate Amount: Large Exudate Type: Serosanguineous Exudate Color: red, brown Foul Odor After Cleansing: No Wound Bed Granulation Amount: Small (1-33%) Exposed Structure Granulation Quality: Red, Hyper-granulation Fascia Exposed: No Necrotic Amount: Small (1-33%) Fat Layer Exposed: No Necrotic Quality: Adherent Slough Tendon Exposed: No Muscle Exposed: No Joint Exposed: No [name], [name] ([id number]) Bone Exposed: No Limited to Skin Breakdown Periwound Skin Texture Texture Color No Abnormalities Noted: No No Abnormalities Noted: No Callus: No Atrophie Blanche: No Crepitus: No Cyanosis: No Excoriation: No Ecchymosis: Yes Fluctuance: No Erythema: No Friable: No Hemosiderin Staining: No Induration: No Mottled: No Localized Edema: No Pallor: No Rash: No Rubor:  No Scarring: Yes Temperature / Pain Moisture Temperature: No Abnormality No Abnormalities Noted: No Tenderness on Palpation: Yes Dry / Scaly: No Maceration: No Moist: Yes Wound Preparation Ulcer Cleansing: Rinsed/Irrigated with Saline Topical Anesthetic Applied: Other: lidocaine 4%, Treatment Notes Wound #3 (Left, Medial Lower Leg) 1. Cleansed with: Clean wound with Normal Saline 2. Anesthetic Topical Lidocaine 4% cream to wound bed prior to debridement 4. Dressing Applied: Hydrafera Blue 5. Secondary Dressing Applied Bordered Foam Dressing 7. Secured with Patient to wear own compression stockings Electronic Signature(s) Signed: 09/02/2016 10:50:50 AM By: Elliot GurneyWoody, RN, BSN, Kim RN, BSN Entered By: Elliot GurneyWoody, RN, BSN, Kim on 09/01/2016 13:03:33 [name], [place] V. ([id number]) -------------------------------------------------------------------------------- Wound Assessment Details Patient Name: [name], [name] Date of Service: 09/01/2016 12:45 PM Medical Record Number: [id number] Patient Account Number: [id number] Date of Birth/Sex: 03/27/1930 (82 y.o. Female) Treating RN: Huel CoventryWoody, Kim Primary Care Physician: Aram BeechamSparks, Jeffrey Other Clinician: Referring Physician: Aram BeechamSparks, Jeffrey Treating Physician/Extender: Rudene ReBritto, Errol Weeks in Treatment: 8 Wound Status Wound Number: 4 Primary Etiology: Trauma, Other Wound Location: Left, Proximal Lower Leg Wound Status: Healed - Epithelialized Wounding Event: Trauma Date Acquired: 08/11/2016 Weeks Of Treatment: 2 Clustered Wound: No Photos Photo Uploaded By: Elliot GurneyWoody, RN, BSN, Kim on 09/01/2016 13:03:59 Wound Measurements Length: (cm) 0 % Reduction Width: (cm) 0 % Reduction Depth: (cm) 0 Area: (cm) 0 Volume: (cm) 0 in Area: 100% in Volume: 100% Wound Description Classification: Partial Thickness Periwound Skin Texture Texture Color No Abnormalities Noted: No No Abnormalities Noted: No Moisture No Abnormalities Noted: No Electronic  Signature(s) Signed: 09/02/2016 10:50:50 AM By: Elliot GurneyWoody,  RN, BSN, Radio producer, BSN Entered By: Elliot Gurney, RN, BSN, Kim on 09/01/2016 13:02:44 [name] ([id number]) -------------------------------------------------------------------------------- Vitals Details Patient Name: [name] Date of Service: 09/01/2016 12:45 PM Medical Record Number: [id number] Patient Account Number: [id number] Date of Birth/Sex: March 12, 1930 (82 y.o. Female) Treating RN: Huel Coventry Primary Care Physician: Aram Beecham Other Clinician: Referring Physician: Aram Beecham Treating Physician/Extender: Rudene Re in Treatment: 8 Vital Signs Time Taken: 12:57 Temperature (F): 98.3 Height (in): 60 Pulse (bpm): 63 Weight (lbs): 126 Respiratory Rate (breaths/min): 18 Body Mass Index (BMI): 24.6 Blood Pressure (mmHg): 150/57 Reference Range: 80 - 120 mg / dl Electronic Signature(s) Signed: 09/02/2016 10:50:50 AM By: Elliot Gurney, RN, BSN, Kim RN, BSN Entered By: Elliot Gurney, RN, BSN, Kim on 09/01/2016 12:57:49

## 2016-09-02 NOTE — Progress Notes (Signed)
[name], Rishita V. ([id number]) Visit Report for 09/01/2016 Chief Complaint Document Details Patient Name: [name], [name] 09/01/2016 12:45 Date of Service: PM Medical Record [id number] Number: Patient Account Number: [id number] 11/08/1930 (82 y.o. Treating RN: Huel CoventryWoody, Kim Date of Birth/Sex: Female) Other Clinician: Primary Care Physician: Aram BeechamSparks, Jeffrey Treating Evlyn KannerBritto, Armaan Pond Referring Physician: Aram BeechamSparks, Jeffrey Physician/Extender: Tania AdeWeeks in Treatment: 8 Information Obtained from: Patient Chief Complaint Patient seen for complaints of Non-Healing Wound to the left lower extremity for about a week Electronic Signature(s) Signed: 09/01/2016 1:24:06 PM By: Evlyn KannerBritto, Shaarav Ripple MD, FACS Entered By: Evlyn KannerBritto, Julian Askin on 09/01/2016 13:24:06 [name], Hildagard V. ([id number]) -------------------------------------------------------------------------------- HPI Details Patient Name: [name], [name] 09/01/2016 12:45 Date of Service: PM Medical Record [id number] Number: Patient Account Number: [id number] 09/09/1930 (82 y.o. Treating RN: Huel CoventryWoody, Kim Date of Birth/Sex: Female) Other Clinician: Primary Care Physician: Aram BeechamSparks, Jeffrey Treating Evlyn KannerBritto, Tyion Boylen Referring Physician: Aram BeechamSparks, Jeffrey Physician/Extender: Tania AdeWeeks in Treatment: 8 History of Present Illness Location: left lower extremity Quality: Patient reports experiencing an aching pain to affected area(s). Severity: Patient states wound (s) are getting better. Duration: Patient has had the wound for < 1 weeks prior to presenting for treatment Timing: Pain in wound is Intermittent (comes and goes) Context: The wound occurred when the patient tripped and injured her left lower extremity where she had some skin tears. Modifying Factors: Other treatment(s) tried include: has been applying some Neosporin ointment over this Associated Signs and Symptoms: Patient reports having: no bleeding or discharge from the wound. HPI Description: 82 year old patient  who is looking much younger than his stated age comes in with a history of having a laceration to her left lower extremity which she sustained about a week ago. She has several medical comorbidities including degenerative arthritis, scoliosis, history of back surgery, pacemaker placement,AMA positive, ulnar neuropathy and left carpal tunnel syndrome. she is also had sclerotherapy for varicose veins in May 2003. her medications include some prednisone at the present time which she may be coming off soon. [facility] where they have been dressing her wound and she is hear for review. 08/18/2016 -- a small traumatic ulceration just superior medial to her previous wound and this was caused while she was trying to get her dressing off Electronic Signature(s) Signed: 09/01/2016 1:24:11 PM By: Evlyn KannerBritto, Lanard Arguijo MD, FACS Entered By: Evlyn KannerBritto, Akshitha Culmer on 09/01/2016 13:24:11 [name], Anjolaoluwa V. ([id number]) -------------------------------------------------------------------------------- Physical Exam Details Patient Name: [name], [name] 09/01/2016 12:45 Date of Service: PM Medical Record [id number] Number: Patient Account Number: [id number] 11/09/1930 (82 y.o. Treating RN: Huel CoventryWoody, Kim Date of Birth/Sex: Female) Other Clinician: Primary Care Physician: Aram BeechamSparks, Jeffrey Treating Evlyn KannerBritto, Elyas Villamor Referring Physician: Aram BeechamSparks, Jeffrey Physician/Extender: Tania AdeWeeks in Treatment: 8 Constitutional . Pulse regular. Respirations normal and unlabored. Afebrile. . Eyes Nonicteric. Reactive to light. Ears, Nose, Mouth, and Throat Lips, teeth, and gums WNL.Marland Kitchen. Moist mucosa without lesions. Neck supple and nontender. No palpable supraclavicular or cervical adenopathy. Normal sized without goiter. Respiratory WNL. No retractions.. Breath sounds WNL, No rubs, rales, rhonchi, or wheeze.. Cardiovascular Heart rhythm and rate regular, no murmur or gallop.. Pedal Pulses WNL. No clubbing, cyanosis or  edema. Lymphatic No adneopathy. No adenopathy. No adenopathy. Musculoskeletal Adexa without tenderness or enlargement.. Digits and nails w/o clubbing, cyanosis, infection, petechiae, ischemia, or inflammatory conditions.. Integumentary (Hair, Skin) No suspicious lesions. No crepitus or fluctuance. No peri-wound warmth or erythema. No masses.Marland Kitchen. Psychiatric Judgement and insight Intact.. No evidence of depression, anxiety, or agitation.. Notes No debridement is required and lymphedema is looking much better  and the wound is much smaller. Electronic Signature(s) Signed: 09/01/2016 1:24:37 PM By: Evlyn Kanner MD, FACS Entered By: Evlyn Kanner on 09/01/2016 13:24:36 [name] ([id number]) -------------------------------------------------------------------------------- Physician Orders Details Patient Name: [name] 09/01/2016 12:45 Date of Service: PM Medical Record [id number] Number: Patient Account Number: [id number] 07/19/1930 (82 y.o. Treating RN: Huel Coventry Date of Birth/Sex: Female) Other Clinician: Primary Care Physician: Aram Beecham Treating Evlyn Kanner Referring Physician: Aram Beecham Physician/Extender: Tania Ade in Treatment: 8 Verbal / Phone Orders: Yes Clinician: Huel Coventry Read Back and Verified: Yes Diagnosis Coding Wound Cleansing Wound #3 Left,Medial Lower Leg o Clean wound with Normal Saline. o May Shower, gently pat wound dry prior to applying new dressing. Anesthetic Wound #3 Left,Medial Lower Leg o Topical Lidocaine 4% cream applied to wound bed prior to debridement Primary Wound Dressing Wound #3 Left,Medial Lower Leg o Hydrafera Blue Secondary Dressing o ABD and Kerlix/Conform Wound #3 Left,Medial Lower Leg o Gauze and Kerlix/Conform Dressing Change Frequency Wound #3 Left,Medial Lower Leg o Change dressing every other day. Follow-up Appointments Wound #3 Left,Medial Lower Leg o Return Appointment in 1  week. Edema Control Wound #3 Left,Medial Lower Leg o Support Garment 30-40 mm/Hg pressure to: Additional Orders / Instructions Wound #3 Left,Medial Lower Leg o Increase protein intake. KELLSEY, SANSONE ([id number]) o Activity as tolerated Electronic Signature(s) Signed: 09/01/2016 4:25:20 PM By: Evlyn Kanner MD, FACS Signed: 09/02/2016 10:50:50 AM By: Elliot Gurney RN, BSN, Kim RN, BSN Entered By: Elliot Gurney, RN, BSN, Kim on 09/01/2016 13:15:35 [name] ([id number]) -------------------------------------------------------------------------------- Problem List Details Patient Name: FAYLINN, SCHWENN 09/01/2016 12:45 Date of Service: PM Medical Record [id number] Number: Patient Account Number: [id number] 01/19/1930 (82 y.o. Treating RN: Huel Coventry Date of Birth/Sex: Female) Other Clinician: Primary Care Physician: Aram Beecham Treating Evlyn Kanner Referring Physician: Aram Beecham Physician/Extender: Tania Ade in Treatment: 8 Active Problems ICD-10 Encounter Code Description Active Date Diagnosis S81.812A Laceration without foreign body, left lower leg, initial 07/04/2016 Yes encounter L97.222 Non-pressure chronic ulcer of left calf with fat layer 07/04/2016 Yes exposed Inactive Problems Resolved Problems Electronic Signature(s) Signed: 09/01/2016 1:23:56 PM By: Evlyn Kanner MD, FACS Entered By: Evlyn Kanner on 09/01/2016 13:23:56 [name] ([id number]) -------------------------------------------------------------------------------- Progress Note Details Patient Name: MERIAN, WROE 09/01/2016 12:45 Date of Service: PM Medical Record [id number] Number: Patient Account Number: [id number] November 17, 1930 (82 y.o. Treating RN: Huel Coventry Date of Birth/Sex: Female) Other Clinician: Primary Care Physician: Aram Beecham Treating Evlyn Kanner Referring Physician: Aram Beecham Physician/Extender: Tania Ade in Treatment: 8 Subjective Chief Complaint Information  obtained from Patient Patient seen for complaints of Non-Healing Wound to the left lower extremity for about a week History of Present Illness (HPI) The following HPI elements were documented for the patient's wound: Location: left lower extremity Quality: Patient reports experiencing an aching pain to affected area(s). Severity: Patient states wound (s) are getting better. Duration: Patient has had the wound for < 1 weeks prior to presenting for treatment Timing: Pain in wound is Intermittent (comes and goes) Context: The wound occurred when the patient tripped and injured her left lower extremity where she had some skin tears. Modifying Factors: Other treatment(s) tried include: has been applying some Neosporin ointment over this Associated Signs and Symptoms: Patient reports having: no bleeding or discharge from the wound. 82 year old patient who is looking much younger than his stated age comes in with a history of having a laceration to her left lower extremity which she sustained about a week ago. She has several medical comorbidities including  degenerative arthritis, scoliosis, history of back surgery, pacemaker placement,AMA positive, ulnar neuropathy and left carpal tunnel syndrome. she is also had sclerotherapy for varicose veins in May 2003. her medications include some prednisone at the present time which she may be coming off soon. She went to the [facility] where they have been dressing her wound and she is hear for review. 08/18/2016 -- a small traumatic ulceration just superior medial to her previous wound and this was caused while she was trying to get her dressing off Objective Constitutional Pulse regular. Respirations normal and unlabored. Afebrile. JAHYRA, SUKUP ([id number]) Vitals Time Taken: 12:57 PM, Height: 60 in, Weight: 126 lbs, BMI: 24.6, Temperature: 98.3 F, Pulse: 63 bpm, Respiratory Rate: 18 breaths/min, Blood Pressure: 150/57  mmHg. Eyes Nonicteric. Reactive to light. Ears, Nose, Mouth, and Throat Lips, teeth, and gums WNL.Marland Kitchen Moist mucosa without lesions. Neck supple and nontender. No palpable supraclavicular or cervical adenopathy. Normal sized without goiter. Respiratory WNL. No retractions.. Breath sounds WNL, No rubs, rales, rhonchi, or wheeze.. Cardiovascular Heart rhythm and rate regular, no murmur or gallop.. Pedal Pulses WNL. No clubbing, cyanosis or edema. Lymphatic No adneopathy. No adenopathy. No adenopathy. Musculoskeletal Adexa without tenderness or enlargement.. Digits and nails w/o clubbing, cyanosis, infection, petechiae, ischemia, or inflammatory conditions.Marland Kitchen Psychiatric Judgement and insight Intact.. No evidence of depression, anxiety, or agitation.. General Notes: No debridement is required and lymphedema is looking much better and the wound is much smaller. Integumentary (Hair, Skin) No suspicious lesions. No crepitus or fluctuance. No peri-wound warmth or erythema. No masses.. Wound #3 status is Open. Original cause of wound was Trauma. The wound is located on the Left,Medial Lower Leg. The wound measures 0.9cm length x 0.8cm width x 0.1cm depth; 0.565cm^2 area and 0.057cm^3 volume. The wound is limited to skin breakdown. There is no tunneling or undermining noted. There is a large amount of serosanguineous drainage noted. The wound margin is flat and intact. There is small (1-33%) red granulation within the wound bed. There is a small (1-33%) amount of necrotic tissue within the wound bed including Adherent Slough. The periwound skin appearance exhibited: Scarring, Moist, Ecchymosis. The periwound skin appearance did not exhibit: Callus, Crepitus, Excoriation, Fluctuance, Friable, Induration, Localized Edema, Rash, Dry/Scaly, Maceration, Atrophie Blanche, Cyanosis, Hemosiderin Staining, Mottled, Pallor, Rubor, Erythema. Periwound temperature was noted as No Abnormality. The periwound has  tenderness on palpation. Wound #4 status is Healed - Epithelialized. Original cause of wound was Trauma. The wound is located on the Left,Proximal Lower Leg. The wound measures 0cm length x 0cm width x 0cm depth; 0cm^2 area and LAKRISTA, SCADUTO V. ([id number]) 0cm^3 volume. Assessment Active Problems ICD-10 S81.812A - Laceration without foreign body, left lower leg, initial encounter L97.222 - Non-pressure chronic ulcer of left calf with fat layer exposed Plan Wound Cleansing: Wound #3 Left,Medial Lower Leg: Clean wound with Normal Saline. May Shower, gently pat wound dry prior to applying new dressing. Anesthetic: Wound #3 Left,Medial Lower Leg: Topical Lidocaine 4% cream applied to wound bed prior to debridement Primary Wound Dressing: Wound #3 Left,Medial Lower Leg: Hydrafera Blue Secondary Dressing: ABD and Kerlix/Conform Wound #3 Left,Medial Lower Leg: Gauze and Kerlix/Conform Dressing Change Frequency: Wound #3 Left,Medial Lower Leg: Change dressing every other day. Follow-up Appointments: Wound #3 Left,Medial Lower Leg: Return Appointment in 1 week. Edema Control: Wound #3 Left,Medial Lower Leg: Support Garment 30-40 mm/Hg pressure to: Additional Orders / Instructions: Wound #3 Left,Medial Lower Leg: Increase protein intake. Activity as tolerated SPECIAL, RANES ([id number]) We'll use a  piece of RTD over this wound and then some foam and use her dural layer compression stockings to manage her lymphedema. She will come back and see me next week Electronic Signature(s) Signed: 09/01/2016 1:25:02 PM By: Evlyn Kanner MD, FACS Entered By: Evlyn Kanner on 09/01/2016 13:25:02 [name] ([id number]) -------------------------------------------------------------------------------- SuperBill Details Patient Name: [name] Date of Service: 09/01/2016 Medical Record Number: [id number] Patient Account Number: [id number] Date of Birth/Sex: 1930-09-05 (82 y.o.  Female) Treating RN: Huel Coventry Primary Care Physician: Aram Beecham Other Clinician: Referring Physician: Aram Beecham Treating Physician/Extender: Rudene Re in Treatment: 8 Diagnosis Coding ICD-10 Codes Code Description [phone] Laceration without foreign body, left lower leg, initial encounter L97.222 Non-pressure chronic ulcer of left calf with fat layer exposed Facility Procedures CPT4 Code: [id number] Description: [phone] - WOUND CARE VISIT-LEV 2 EST PT Modifier: Quantity: 1 Physician Procedures CPT4 Code Description: [id number] - WC PHYS LEVEL 3 - EST PT ICD-10 Description Diagnosis S81.812A Laceration without foreign body, left lower leg, ini L97.222 Non-pressure chronic ulcer of left calf with fat lay Modifier: tial encounter er exposed Quantity: 1 Electronic Signature(s) Signed: 09/01/2016 1:25:20 PM By: Evlyn Kanner MD, FACS Entered By: Evlyn Kanner on 09/01/2016 13:25:20

## 2016-09-08 ENCOUNTER — Encounter: Payer: Medicare Other | Admitting: Surgery

## 2016-09-08 DIAGNOSIS — S81812A Laceration without foreign body, left lower leg, initial encounter: Secondary | ICD-10-CM | POA: Diagnosis not present

## 2016-09-09 NOTE — Progress Notes (Signed)
RASHEA, HOSKIE ([id number]) Visit Report for 09/08/2016 Arrival Information Details Patient Name: [name], [name]. Date of Service: 09/08/2016 10:00 AM Medical Record Number: [id number] Patient Account Number: [id number] Date of Birth/Sex: 01-30-1930 (82 y.o. Female) Treating RN: Huel Coventry Primary Care Physician: Aram Beecham Other Clinician: Referring Physician: Aram Beecham Treating Physician/Extender: Rudene Re in Treatment: 9 Visit Information History Since Last Visit Added or deleted any medications: No Patient Arrived: Ambulatory Any new allergies or adverse reactions: No Arrival Time: 10:04 Had a fall or experienced change in No Accompanied By: self activities of daily living that may affect Transfer Assistance: None risk of falls: Patient Identification Verified: Yes Signs or symptoms of abuse/neglect since last No Secondary Verification Process Yes visito Completed: Hospitalized since last visit: No Patient Requires Transmission-Based No Has Dressing in Place as Prescribed: Yes Precautions: Pain Present Now: No Patient Has Alerts: No Electronic Signature(s) Signed: 09/08/2016 1:56:10 PM By: Elliot Gurney, RN, BSN, Kim RN, BSN Entered By: Elliot Gurney, RN, BSN, Kim on 09/08/2016 10:05:13 [name] ([id number]) -------------------------------------------------------------------------------- Clinic Level of Care Assessment Details Patient Name: [name] Date of Service: 09/08/2016 10:00 AM Medical Record Number: [id number] Patient Account Number: [id number] Date of Birth/Sex: [birth year] (82 y.o. Female) Treating RN: Afful, RN, BSN, Rita Primary Care Physician: Aram Beecham Other Clinician: Referring Physician: Aram Beecham Treating Physician/Extender: Rudene Re in Treatment: 9 Clinic Level of Care Assessment Items TOOL 4 Quantity Score []  - Use when only an EandM is performed on FOLLOW-UP visit 0 ASSESSMENTS - Nursing Assessment /  Reassessment X - Reassessment of Co-morbidities (includes updates in patient status) 1 10 X - Reassessment of Adherence to Treatment Plan 1 5 ASSESSMENTS - Wound and Skin Assessment / Reassessment X - Simple Wound Assessment / Reassessment - one wound 1 5 []  - Complex Wound Assessment / Reassessment - multiple wounds 0 []  - Dermatologic / Skin Assessment (not related to wound area) 0 ASSESSMENTS - Focused Assessment []  - Circumferential Edema Measurements - multi extremities 0 []  - Nutritional Assessment / Counseling / Intervention 0 X - Lower Extremity Assessment (monofilament, tuning fork, pulses) 1 5 []  - Peripheral Arterial Disease Assessment (using hand held doppler) 0 ASSESSMENTS - Ostomy and/or Continence Assessment and Care []  - Incontinence Assessment and Management 0 []  - Ostomy Care Assessment and Management (repouching, etc.) 0 PROCESS - Coordination of Care X - Simple Patient / Family Education for ongoing care 1 15 []  - Complex (extensive) Patient / Family Education for ongoing care 0 []  - Staff obtains , Records, Test Results / Process Orders 0 []  - Staff telephones HHA, Nursing Homes / Clarify orders / etc 0 []  - Routine Transfer to another Facility (non-emergent condition) 0 [name], [name] ( ) []  - Routine Hospital Admission (non-emergent condition) 0 []  - New Admissions / / Ordering NPWT, Apligraf, etc. 0 []  - Emergency Hospital Admission (emergent condition) 0 []  - Simple Discharge Coordination 0 []  - Complex (extensive) Discharge Coordination 0 PROCESS - Special Needs []  - Pediatric / Minor Patient Management 0 []  - Isolation Patient Management 0 []  - Hearing / Language / Visual special needs 0 []  - Assessment of Community assistance (transportation, D/C planning, etc.) 0 []  - Additional assistance / Altered mentation 0 []  - Support Surface(s) Assessment (bed, cushion, seat, etc.) 0 INTERVENTIONS - Wound Cleansing /  Measurement X - Simple Wound Cleansing - one wound 1 5 []  - Complex Wound Cleansing - multiple wounds 0 X - Wound Imaging (photographs - any  number of wounds) 1 5 []  - Wound Tracing (instead of photographs) 0 X - Simple Wound Measurement - one wound 1 5 []  - Complex Wound Measurement - multiple wounds 0 INTERVENTIONS - Wound Dressings X - Small Wound Dressing one or multiple wounds 1 10 []  - Medium Wound Dressing one or multiple wounds 0 []  - Large Wound Dressing one or multiple wounds 0 []  - Application of Medications - topical 0 []  - Application of Medications - injection 0 INTERVENTIONS - Miscellaneous []  - External ear exam 0 [name], [name] ([id number]) []  - Specimen Collection (cultures, biopsies, blood, body fluids, etc.) 0 []  - Specimen(s) / Culture(s) sent or taken to Lab for analysis 0 []  - Patient Transfer (multiple staff / Michiel Sites Lift / Similar devices) 0 []  - Simple Staple / Suture removal (25 or less) 0 []  - Complex Staple / Suture removal (26 or more) 0 []  - Hypo / Hyperglycemic Management (close monitor of Blood Glucose) 0 []  - Ankle / Brachial Index (ABI) - do not check if billed separately 0 X - Vital Signs 1 5 Has the patient been seen at the hospital within the last three years: Yes Total Score: 70 Level Of Care: New/Established - Level 2 Electronic Signature(s) Signed: 09/08/2016 5:32:18 PM By: Elpidio Eric BSN, RN Entered By: Elpidio Eric on 09/08/2016 10:24:30 [name] ([id number]) -------------------------------------------------------------------------------- Encounter Discharge Information Details Patient Name: [name] Date of Service: 09/08/2016 10:00 AM Medical Record Number: [id number] Patient Account Number: [id number] Date of Birth/Sex: Oct 24, 1930 (82 y.o. Female) Treating RN: Huel Coventry Primary Care Physician: Aram Beecham Other Clinician: Referring Physician: Aram Beecham Treating Physician/Extender: Rudene Re in  Treatment: 9 Encounter Discharge Information Items Discharge Pain Level: 0 Discharge Condition: Stable Ambulatory Status: Ambulatory Discharge Destination: Home Transportation: Private Auto Accompanied By: self Schedule Follow-up Appointment: Yes Medication Reconciliation completed and provided to Patient/Care Yes Brixon Zhen: Provided on Clinical Summary of Care: 09/08/2016 Form Type Recipient Paper Patient [facility] Electronic Signature(s) Signed: 09/08/2016 5:32:18 PM By: Elpidio Eric BSN, RN Previous Signature: 09/08/2016 10:28:47 AM Version By: Gwenlyn Perking Entered By: Elpidio Eric on 09/08/2016 10:31:02 [name] ([id number]) -------------------------------------------------------------------------------- Lower Extremity Assessment Details Patient Name: [name] Date of Service: 09/08/2016 10:00 AM Medical Record Number: [id number] Patient Account Number: [id number] Date of Birth/Sex: 09-Dec-1930 (82 y.o. Female) Treating RN: Huel Coventry Primary Care Physician: Aram Beecham Other Clinician: Referring Physician: Aram Beecham Treating Physician/Extender: Rudene Re in Treatment: 9 Vascular Assessment Pulses: Posterior Tibial Palpable: [Left:Yes] Dorsalis Pedis Palpable: [Left:Yes] Extremity colors, hair growth, and conditions: Extremity Color: [Left:Hyperpigmented] Hair Growth on Extremity: [Left:No] Temperature of Extremity: [Left:Cool] Capillary Refill: [Left:< 3 seconds] Dependent Rubor: [Left:No] Blanched when Elevated: [Left:No] Lipodermatosclerosis: [Left:No] Toe Nail Assessment Left: Right: Thick: No Discolored: No Deformed: No Improper Length and Hygiene: No Electronic Signature(s) Signed: 09/08/2016 1:56:10 PM By: Elliot Gurney, RN, BSN, Kim RN, BSN Entered By: Elliot Gurney, RN, BSN, Kim on 09/08/2016 10:11:01 [name] ([id number]) -------------------------------------------------------------------------------- Multi Wound Chart  Details Patient Name: [name] Date of Service: 09/08/2016 10:00 AM Medical Record Number: [id number] Patient Account Number: [id number] Date of Birth/Sex: [birth year] (82 y.o. Female) Treating RN: Clover Mealy, RN, BSN, [place] Primary Care Physician: Aram Beecham Other Clinician: Referring Physician: Aram Beecham Treating Physician/Extender: Rudene Re in Treatment: 9 Vital Signs Height(in): 60 Pulse(bpm): 65 Weight(lbs): 126 Blood Pressure 124/49 (mmHg): Body Mass Index(BMI): 25 Temperature(F): 97.9 Respiratory Rate 18 (breaths/min): Photos: [N/A:N/A] Wound Location: Left Lower Leg - Medial N/A N/A Wounding Event:  Trauma N/A N/A Primary Etiology: Trauma, Other N/A N/A Comorbid History: Hypertension, Gout, N/A N/A Osteoarthritis, Neuropathy Date Acquired: 06/27/2016 N/A N/A Weeks of Treatment: 9 N/A N/A Wound Status: Open N/A N/A Clustered Wound: Yes N/A N/A Clustered Quantity: 2 N/A N/A Measurements L x W x D 0.5x0.4x0.1 N/A N/A (cm) Area (cm) : 0.157 N/A N/A Volume (cm) : 0.016 N/A N/A % Reduction in Area: 99.60% N/A N/A % Reduction in Volume: 99.60% N/A N/A Classification: Partial Thickness N/A N/A Exudate Amount: Large N/A N/A Exudate Type: Serosanguineous N/A N/A Exudate Color: red, brown N/A N/A Wound Margin: Flat and Intact N/A N/A Granulation Amount: Small (1-33%) N/A N/A Granulation Quality: Red, Hyper-granulation N/A N/A Necrotic Amount: Small (1-33%) N/A N/A [name], [name] ([id number]) Exposed Structures: Fascia: No N/A N/A Fat: No Tendon: No Muscle: No Joint: No Bone: No Limited to Skin Breakdown Epithelialization: Medium (34-66%) N/A N/A Periwound Skin Texture: Scarring: Yes N/A N/A Edema: No Excoriation: No Induration: No Callus: No Crepitus: No Fluctuance: No Friable: No Rash: No Periwound Skin Moist: Yes N/A N/A Moisture: Maceration: No Dry/Scaly: No Periwound Skin Color: Ecchymosis: Yes N/A N/A Atrophie Blanche:  No Cyanosis: No Erythema: No Hemosiderin Staining: No Mottled: No Pallor: No Rubor: No Temperature: No Abnormality N/A N/A Tenderness on Yes N/A N/A Palpation: Wound Preparation: Ulcer Cleansing: N/A N/A Rinsed/Irrigated with Saline Topical Anesthetic Applied: Other: lidocaine 4% Treatment Notes Electronic Signature(s) Signed: 09/08/2016 5:32:18 PM By: Elpidio Eric BSN, RN Entered By: Elpidio Eric on 09/08/2016 10:20:31 [name] ([id number]) -------------------------------------------------------------------------------- Multi-Disciplinary Care Plan Details Patient Name: [name] Date of Service: 09/08/2016 10:00 AM Medical Record Number: [id number] Patient Account Number: [id number] Date of Birth/Sex: 12/28/1929 (82 y.o. Female) Treating RN: Afful, RN, BSN, [place]ink Primary Care Physician: Aram Beecham Other Clinician: Referring Physician: Aram Beecham Treating Physician/Extender: Rudene Re in Treatment: 9 Active Inactive Abuse / Safety / Falls / Self Care Management Nursing Diagnoses: Potential for falls Goals: Patient will remain injury free Date Initiated: 07/04/2016 Goal Status: Active Interventions: Assess fall risk on admission and as needed Notes: Soft Tissue Infection Nursing Diagnoses: Impaired tissue integrity Potential for infection: soft tissue Goals: Patient will remain free of wound infection Date Initiated: 07/04/2016 Goal Status: Active Interventions: Assess signs and symptoms of infection every visit Notes: Wound/Skin Impairment Nursing Diagnoses: Impaired tissue integrity Goals: Ulcer/skin breakdown will have a volume reduction of 30% by week 4 [name], [name] ([id number]) Date Initiated: 07/04/2016 Goal Status: Active Interventions: Assess ulceration(s) every visit Notes: Electronic Signature(s) Signed: 09/08/2016 5:32:18 PM By: Elpidio Eric BSN, RN Entered By: Elpidio Eric on 09/08/2016 10:20:11 [name]  ([id number]) -------------------------------------------------------------------------------- Pain Assessment Details Patient Name: [name] Date of Service: 09/08/2016 10:00 AM Medical Record Number: [id number] Patient Account Number: [id number] Date of Birth/Sex: 20-Jan-1930 (82 y.o. Female) Treating RN: Huel Coventry Primary Care Physician: Aram Beecham Other Clinician: Referring Physician: Aram Beecham Treating Physician/Extender: Rudene Re in Treatment: 9 Active Problems Location of Pain Severity and Description of Pain Patient Has Paino No Site Locations With Dressing Change: No Pain Management and Medication Current Pain Management: Electronic Signature(s) Signed: 09/08/2016 1:56:10 PM By: Elliot Gurney, RN, BSN, Kim RN, BSN Entered By: Elliot Gurney, RN, BSN, Kim on 09/08/2016 10:05:20 [name] ([id number]) -------------------------------------------------------------------------------- Wound Assessment Details Patient Name: [name] Date of Service: 09/08/2016 10:00 AM Medical Record Number: [id number] Patient Account Number: [id number] Date of Birth/Sex: [birth year] (82 y.o. Female) Treating RN: Huel Coventry Primary Care Physician: Aram Beecham Other Clinician: Referring  Physician: Aram Beecham Treating Physician/Extender: Rudene Re in Treatment: 9 Wound Status Wound Number: 3 Primary Trauma, Other Etiology: Wound Location: Left Lower Leg - Medial Wound Status: Open Wounding Event: Trauma Comorbid Hypertension, Gout, Osteoarthritis, Date Acquired: 06/27/2016 History: Neuropathy Weeks Of Treatment: 9 Clustered Wound: Yes Photos Wound Measurements Length: (cm) 0.5 Width: (cm) 0.4 Depth: (cm) 0.1 Clustered Quantity: 2 Area: (cm) 0.157 Volume: (cm) 0.016 % Reduction in Area: 99.6% % Reduction in Volume: 99.6% Epithelialization: Medium (34-66%) Tunneling: No Undermining: No Wound Description Classification: Partial  Thickness Wound Margin: Flat and Intact Exudate Amount: Large Exudate Type: Serosanguineous Exudate Color: red, brown Foul Odor After Cleansing: No Wound Bed Granulation Amount: Small (1-33%) Exposed Structure Granulation Quality: Red, Hyper-granulation Fascia Exposed: No Necrotic Amount: Small (1-33%) Fat Layer Exposed: No Necrotic Quality: Adherent Slough Tendon Exposed: No Muscle Exposed: No Joint Exposed: No [name], [name] ([id number]) Bone Exposed: No Limited to Skin Breakdown Periwound Skin Texture Texture Color No Abnormalities Noted: No No Abnormalities Noted: No Callus: No Atrophie Blanche: No Crepitus: No Cyanosis: No Excoriation: No Ecchymosis: Yes Fluctuance: No Erythema: No Friable: No Hemosiderin Staining: No Induration: No Mottled: No Localized Edema: No Pallor: No Rash: No Rubor: No Scarring: Yes Temperature / Pain Moisture Temperature: No Abnormality No Abnormalities Noted: No Tenderness on Palpation: Yes Dry / Scaly: No Maceration: No Moist: Yes Wound Preparation Ulcer Cleansing: Rinsed/Irrigated with Saline Topical Anesthetic Applied: Other: lidocaine 4%, Treatment Notes Wound #3 (Left, Medial Lower Leg) 1. Cleansed with: Clean wound with Normal Saline 2. Anesthetic Topical Lidocaine 4% cream to wound bed prior to debridement 4. Dressing Applied: Hydrafera Blue 5. Secondary Dressing Applied Bordered Foam Dressing Electronic Signature(s) Signed: 09/08/2016 1:56:10 PM By: Elliot Gurney, RN, BSN, Kim RN, BSN Entered By: Elliot Gurney, RN, BSN, Kim on 09/08/2016 10:10:06 [name] ([id number]) -------------------------------------------------------------------------------- Vitals Details Patient Name: [name] Date of Service: 09/08/2016 10:00 AM Medical Record Number: [id number] Patient Account Number: [id number] Date of Birth/Sex: [birth year] (82 y.o. Female) Treating RN: Huel Coventry Primary Care Physician: Aram Beecham Other  Clinician: Referring Physician: Aram Beecham Treating Physician/Extender: Rudene Re in Treatment: 9 Vital Signs Time Taken: 10:05 Temperature (F): 97.9 Height (in): 60 Pulse (bpm): 65 Weight (lbs): 126 Respiratory Rate (breaths/min): 18 Body Mass Index (BMI): 24.6 Blood Pressure (mmHg): 124/49 Reference Range: 80 - 120 mg / dl Electronic Signature(s) Signed: 09/08/2016 1:56:10 PM By: Elliot Gurney, RN, BSN, Kim RN, BSN Entered By: Elliot Gurney, RN, BSN, Kim on 09/08/2016 10:06:01

## 2016-09-09 NOTE — Progress Notes (Signed)
MASSIE, COGLIANO ([id number]) Visit Report for 09/08/2016 Chief Complaint Document Details Patient Name: [name], [name] 09/08/2016 10:00 Date of Service: AM Medical Record [id number] Number: Patient Account Number: [id number] 02-02-1930 (82 y.o. Treating RN: Huel Coventry Date of Birth/Sex: Female) Other Clinician: Primary Care Physician: Aram Beecham Treating Evlyn Kanner Referring Physician: Aram Beecham Physician/Extender: Tania Ade in Treatment: 9 Information Obtained from: Patient Chief Complaint Patient seen for complaints of Non-Healing Wound to the left lower extremity for about a week Electronic Signature(s) Signed: 09/08/2016 10:25:09 AM By: Evlyn Kanner MD, FACS Entered By: Evlyn Kanner on 09/08/2016 10:25:09 [name] ([id number]) -------------------------------------------------------------------------------- HPI Details Patient Name: [name], [name] 09/08/2016 10:00 Date of Service: AM Medical Record [id number] Number: Patient Account Number: [id number] 25-Jul-1930 (82 y.o. Treating RN: Huel Coventry Date of Birth/Sex: Female) Other Clinician: Primary Care Physician: Aram Beecham Treating Evlyn Kanner Referring Physician: Aram Beecham Physician/Extender: Tania Ade in Treatment: 9 History of Present Illness Location: left lower extremity Quality: Patient reports experiencing an aching pain to affected area(s). Severity: Patient states wound (s) are getting better. Duration: Patient has had the wound for < 1 weeks prior to presenting for treatment Timing: Pain in wound is Intermittent (comes and goes) Context: The wound occurred when the patient tripped and injured her left lower extremity where she had some skin tears. Modifying Factors: Other treatment(s) tried include: has been applying some Neosporin ointment over this Associated Signs and Symptoms: Patient reports having: no bleeding or discharge from the wound. HPI Description: 82 year old patient  who is looking much younger than his stated age comes in with a history of having a laceration to her left lower extremity which she sustained about a week ago. She has several medical comorbidities including degenerative arthritis, scoliosis, history of back surgery, pacemaker placement,AMA positive, ulnar neuropathy and left carpal tunnel syndrome. she is also had sclerotherapy for varicose veins in May 2003. her medications include some prednisone at the present time which she may be coming off soon. She went to the [facility] where they have been dressing her wound and she is hear for review. 08/18/2016 -- a small traumatic ulceration just superior medial to her previous wound and this was caused while she was trying to get her dressing off Electronic Signature(s) Signed: 09/08/2016 10:25:16 AM By: Evlyn Kanner MD, FACS Entered By: Evlyn Kanner on 09/08/2016 10:25:16 [name] ([id number]) -------------------------------------------------------------------------------- Physical Exam Details Patient Name: [name], [name] 09/08/2016 10:00 Date of Service: AM Medical Record [id number] Number: Patient Account Number: [id number] [birth year] (82 y.o. Treating RN: Huel Coventry Date of Birth/Sex: Female) Other Clinician: Primary Care Physician: Aram Beecham Treating Evlyn Kanner Referring Physician: Aram Beecham Physician/Extender: Weeks in Treatment: 9 Constitutional . Pulse regular. Respirations normal and unlabored. Afebrile. . Eyes Nonicteric. Reactive to light. Ears, Nose, Mouth, and Throat Lips, teeth, and gums WNL.Marland Kitchen Moist mucosa without lesions. Neck supple and nontender. No palpable supraclavicular or cervical adenopathy. Normal sized without goiter. Respiratory WNL. No retractions.. Breath sounds WNL, No rubs, rales, rhonchi, or wheeze.. Cardiovascular Heart rhythm and rate regular, no murmur or gallop.. Pedal Pulses WNL. No clubbing, cyanosis or  edema. Chest Breasts symmetical and no nipple discharge.. Breast tissue WNL, no masses, lumps, or tenderness.. Lymphatic No adneopathy. No adenopathy. No adenopathy. Musculoskeletal Adexa without tenderness or enlargement.. Digits and nails w/o clubbing, cyanosis, infection, petechiae, ischemia, or inflammatory conditions.. Integumentary (Hair, Skin) No suspicious lesions. No crepitus or fluctuance. No peri-wound warmth or erythema. No masses.Marland Kitchen Psychiatric Judgement and insight Intact.. No evidence of  depression, anxiety, or agitation.. Notes the wound is looking very healthy and after removing some superficial eschar there is healthy granulation tissue Electronic Signature(s) Signed: 09/08/2016 10:25:42 AM By: Evlyn Kanner MD, FACS Entered By: Evlyn Kanner on 09/08/2016 10:25:41 [name] ([id number]) -------------------------------------------------------------------------------- Physician Orders Details Patient Name: [name], [name] 09/08/2016 10:00 Date of Service: AM Medical Record [id number] Number: Patient Account Number: [id number] 05/11/1930 (82 y.o. Treating RN: Clover Mealy, RN, BSN, [place] Date of Birth/Sex: Female) Other Clinician: Primary Care Physician: Monia Sabal Referring Physician: Aram Beecham Physician/Extender: Weeks in Treatment: 9 Verbal / Phone Orders: Yes Clinician: Afful, RN, BSN, Rita Read Back and Verified: Yes Diagnosis Coding Wound Cleansing Wound #3 Left,Medial Lower Leg o Clean wound with Normal Saline. o May Shower, gently pat wound dry prior to applying new dressing. Anesthetic Wound #3 Left,Medial Lower Leg o Topical Lidocaine 4% cream applied to wound bed prior to debridement Primary Wound Dressing Wound #3 Left,Medial Lower Leg o Hydrafera Blue Dressing Change Frequency Wound #3 Left,Medial Lower Leg o Change dressing every other day. Follow-up Appointments Wound #3 Left,Medial Lower  Leg o Return Appointment in 2 weeks. - she prefers a monday Edema Control Wound #3 Left,Medial Lower Leg o Support Garment 30-40 mm/Hg pressure to: Additional Orders / Instructions Wound #3 Left,Medial Lower Leg o Increase protein intake. o Activity as tolerated Notes Bandaid as secondary dressing KARIANA, WILES ([id number]) Electronic Signature(s) Signed: 09/08/2016 4:25:16 PM By: Evlyn Kanner MD, FACS Signed: 09/08/2016 5:32:18 PM By: Elpidio Eric BSN, RN Entered By: Elpidio Eric on 09/08/2016 10:25:04 [name] ([id number]) -------------------------------------------------------------------------------- Problem List Details Patient Name: [name], [name] 09/08/2016 10:00 Date of Service: AM Medical Record [id number] Number: Patient Account Number: [id number] Mar 17, 1930 (82 y.o. 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 Treating RN: Huel Coventry Date of Birth/Sex: Female) Other Clinician: Primary Care Physician: Aram Beecham Treating Evlyn Kanner Referring Physician: Aram Beecham Physician/Extender: Tania Ade in Treatment: 9 Subjective Chief Complaint Information obtained from Patient Patient seen for complaints of Non-Healing Wound to the left lower extremity for about a week History of Present Illness (HPI) The following HPI elements were documented for the patient's wound: Location: left lower extremity Quality: Patient reports experiencing an aching pain to affected area(s). Severity: Patient states wound (s) are getting better. Duration: Patient has had the wound for < 1 weeks prior to presenting for treatment Timing: Pain in wound is Intermittent (comes and goes) Context: The wound occurred when the patient tripped and injured her left lower extremity where she had some skin tears. Modifying Factors: Other treatment(s) tried include: has been applying some Neosporin ointment over this Associated Signs and Symptoms: Patient reports having: no bleeding or discharge from the wound. 82 year old patient who is looking much younger than his stated age comes in with a history of having a laceration to her left lower extremity which she  sustained about a week ago. She has several medical comorbidities including degenerative arthritis, scoliosis, history of back surgery, pacemaker placement,AMA positive, ulnar neuropathy and left carpal tunnel syndrome. she is also had sclerotherapy for varicose veins in May 2003. her medications include some prednisone at the present time which she may be coming off soon. She went to the [facility] where they have been dressing her wound and she is hear for review. 08/18/2016 -- a small traumatic ulceration just superior medial to her previous wound and this was caused while she was trying to get her dressing off Objective Constitutional Pulse regular. Respirations normal and unlabored. Afebrile. [name], [name] Kitchen ([id number]) Vitals Time Taken: 10:05 AM, Height: 60 in, Weight: 126 lbs, BMI: 24.6, Temperature: 97.9 F,  Pulse: 65 bpm, Respiratory Rate: 18 breaths/min, Blood Pressure: 124/49 mmHg. Eyes Nonicteric. Reactive to light. Ears, Nose, Mouth, and Throat Lips, teeth, and gums WNL.Marland Kitchen Moist mucosa without lesions. Neck supple and nontender. No palpable supraclavicular or cervical adenopathy. Normal sized without goiter. Respiratory WNL. No retractions.. Breath sounds WNL, No rubs, rales, rhonchi, or wheeze.. Cardiovascular Heart rhythm and rate regular, no murmur or gallop.. Pedal Pulses WNL. No clubbing, cyanosis or edema. Chest Breasts symmetical and no nipple discharge.. Breast tissue WNL, no masses, lumps, or tenderness.. Lymphatic No adneopathy. No adenopathy. No adenopathy. Musculoskeletal Adexa without tenderness or enlargement.. Digits and nails w/o clubbing, cyanosis, infection, petechiae, ischemia, or inflammatory conditions.Marland Kitchen Psychiatric Judgement and insight Intact.. No evidence of depression, anxiety, or agitation.. General Notes: the wound is looking very healthy and after removing some superficial eschar there is healthy granulation tissue Integumentary (Hair, Skin) No suspicious lesions. No crepitus or fluctuance. No peri-wound warmth or erythema. No masses.. Wound #3 status is Open. Original cause of wound was Trauma. The wound is located on the Left,Medial Lower Leg. The wound measures 0.5cm length x 0.4cm width x 0.1cm depth; 0.157cm^2 area and 0.016cm^3 volume. The wound is limited to skin breakdown. There is no tunneling or undermining noted. There is a large amount of serosanguineous drainage noted. The wound margin is flat and intact. There is small (1-33%) red granulation within the wound bed. There is a small (1-33%) amount of necrotic tissue within the wound bed including Adherent Slough. The periwound skin appearance exhibited: Scarring, Moist, Ecchymosis. The periwound skin appearance did not exhibit: Callus, Crepitus, Excoriation, Fluctuance, Friable, Induration,  Localized Edema, Rash, Dry/Scaly, Maceration, Atrophie Blanche, Cyanosis, Hemosiderin Staining, Mottled, Pallor, Rubor, Erythema. Periwound temperature was noted as No Abnormality. The periwound has tenderness on palpation. [name], [name] ([id number]) Assessment Active Problems ICD-10 [phone] - Laceration without foreign body, left lower leg, initial encounter L97.222 - Non-pressure chronic ulcer of left calf with fat layer exposed Plan Wound Cleansing: Wound #3 Left,Medial Lower Leg: Clean wound with Normal Saline. May Shower, gently pat wound dry prior to applying new dressing. Anesthetic: Wound #3 Left,Medial Lower Leg: Topical Lidocaine 4% cream applied to wound bed prior to debridement Primary Wound Dressing: Wound #3 Left,Medial Lower Leg: Hydrafera Blue Dressing Change Frequency: Wound #3 Left,Medial Lower Leg: Change dressing every other day. Follow-up Appointments: Wound #3 Left,Medial Lower Leg: Return Appointment in 2 weeks. - she prefers a monday Edema Control: Wound #3 Left,Medial Lower Leg: Support Garment 30-40 mm/Hg pressure to: Additional Orders / Instructions: Wound #3 Left,Medial Lower Leg: Increase protein intake. Activity as tolerated General Notes: Bandaid as secondary dressing We'll use a piece of RTD over this wound and then some foam and use her dual layer compression stockings  to manage her lymphedema. [name], LIMMER ([id number]) She will come back and see me next week Electronic Signature(s) Signed: 09/08/2016 10:26:23 AM By: Evlyn Kanner MD, FACS Entered By: Evlyn Kanner on 09/08/2016 10:26:23 [name] ([id number]) -------------------------------------------------------------------------------- SuperBill Details Patient Name: [name] Date of Service: 09/08/2016 Medical Record Number: [id number] Patient Account Number: [id number] Date of Birth/Sex: [birth year] (82 y.o. Female) Treating RN: Huel Coventry Primary Care Physician:  Aram Beecham Other Clinician: Referring Physician: Aram Beecham Treating Physician/Extender: Rudene Re in Treatment: 9 Diagnosis Coding ICD-10 Codes Code Description [phone] Laceration without foreign body, left lower leg, initial encounter L97.222 Non-pressure chronic ulcer of left calf with fat layer exposed Facility Procedures CPT4 Code: [id number] Description: [phone] - WOUND CARE VISIT-LEV 2 EST PT Modifier: Quantity: 1 Physician Procedures CPT4 Code Description: [id number] - WC PHYS LEVEL 3 - EST PT ICD-10 Description Diagnosis S81.812A Laceration without foreign body, left lower leg, ini L97.222 Non-pressure chronic ulcer of left calf with fat lay Modifier: tial encounter er exposed Quantity: 1 Electronic Signature(s) Signed: 09/08/2016 10:26:34 AM By: Evlyn Kanner MD, FACS Entered By: Evlyn Kanner on 09/08/2016 10:26:34

## 2016-09-19 ENCOUNTER — Encounter: Payer: Medicare Other | Attending: Nurse Practitioner | Admitting: Nurse Practitioner

## 2016-09-19 DIAGNOSIS — S81812A Laceration without foreign body, left lower leg, initial encounter: Secondary | ICD-10-CM | POA: Insufficient documentation

## 2016-09-19 DIAGNOSIS — M109 Gout, unspecified: Secondary | ICD-10-CM | POA: Diagnosis not present

## 2016-09-19 DIAGNOSIS — Z880 Allergy status to penicillin: Secondary | ICD-10-CM | POA: Insufficient documentation

## 2016-09-19 DIAGNOSIS — M1990 Unspecified osteoarthritis, unspecified site: Secondary | ICD-10-CM | POA: Diagnosis not present

## 2016-09-19 DIAGNOSIS — W19XXXA Unspecified fall, initial encounter: Secondary | ICD-10-CM | POA: Insufficient documentation

## 2016-09-19 DIAGNOSIS — G629 Polyneuropathy, unspecified: Secondary | ICD-10-CM | POA: Insufficient documentation

## 2016-09-19 DIAGNOSIS — I10 Essential (primary) hypertension: Secondary | ICD-10-CM | POA: Insufficient documentation

## 2016-09-19 DIAGNOSIS — M419 Scoliosis, unspecified: Secondary | ICD-10-CM | POA: Diagnosis not present

## 2016-09-19 DIAGNOSIS — Z950 Presence of cardiac pacemaker: Secondary | ICD-10-CM | POA: Insufficient documentation

## 2016-09-19 DIAGNOSIS — L97222 Non-pressure chronic ulcer of left calf with fat layer exposed: Secondary | ICD-10-CM | POA: Insufficient documentation

## 2016-09-20 NOTE — Progress Notes (Signed)
TICARA, WANER ([id number]) Visit Report for 09/19/2016 Arrival Information Details Patient Name: [name], [name]. Date of Service: 09/19/2016 2:15 PM Medical Record Number: [id number] Patient Account Number: [id number] Date of Birth/Sex: [birth year] (82 y.o. Female) Treating RN: Ashok Cordia, Debi Primary Care Physician: Aram Beecham Other Clinician: Referring Physician: Aram Beecham Treating Physician/Extender: Eugene Garnet in Treatment: 11 Visit Information History Since Last Visit All ordered tests and consults were completed: No Patient Arrived: Gilmer Mor Added or deleted any medications: No Arrival Time: 14:26 Any new allergies or adverse reactions: No Accompanied By: self Had a fall or experienced change in No Transfer Assistance: None activities of daily living that may affect Patient Identification Verified: Yes risk of falls: Secondary Verification Process Completed: Yes Signs or symptoms of abuse/neglect since last No Patient Requires Transmission-Based No visito Precautions: Hospitalized since last visit: No Patient Has Alerts: No Pain Present Now: No Electronic Signature(s) Signed: 09/19/2016 4:44:08 PM By: Alejandro Mulling Entered By: Alejandro Mulling on 09/19/2016 14:26:24 [name] ([id number]) -------------------------------------------------------------------------------- Clinic Level of Care Assessment Details Patient Name: [name] Date of Service: 09/19/2016 2:15 PM Medical Record Number: [id number] Patient Account Number: [id number] Date of Birth/Sex: 12-25-1929 (82 y.o. Female) Treating RN: Ashok Cordia, Debi Primary Care Physician: Aram Beecham Other Clinician: Referring Physician: Aram Beecham Treating Physician/Extender: Eugene Garnet in Treatment: 11 Clinic Level of Care Assessment Items TOOL 4 Quantity Score X - Use when only an EandM is performed on FOLLOW-UP visit 1 0 ASSESSMENTS - Nursing Assessment /  Reassessment X - Reassessment of Co-morbidities (includes updates in patient status) 1 10 X - Reassessment of Adherence to Treatment Plan 1 5 ASSESSMENTS - Wound and Skin Assessment / Reassessment X - Simple Wound Assessment / Reassessment - one wound 1 5 []  - Complex Wound Assessment / Reassessment - multiple wounds 0 []  - Dermatologic / Skin Assessment (not related to wound area) 0 ASSESSMENTS - Focused Assessment []  - Circumferential Edema Measurements - multi extremities 0 []  - Nutritional Assessment / Counseling / Intervention 0 []  - Lower Extremity Assessment (monofilament, tuning fork, pulses) 0 []  - Peripheral Arterial Disease Assessment (using hand held doppler) 0 ASSESSMENTS - Ostomy and/or Continence Assessment and Care []  - Incontinence Assessment and Management 0 []  - Ostomy Care Assessment and Management (repouching, etc.) 0 PROCESS - Coordination of Care X - Simple Patient / Family Education for ongoing care 1 15 []  - Complex (extensive) Patient / Family Education for ongoing care 0 X - Staff obtains , Records, Test Results / Process Orders 1 10 []  - Staff telephones HHA, Nursing Homes / Clarify orders / etc 0 []  - Routine Transfer to another Facility (non-emergent condition) 0 OSIRIS, CHARLES ( ) []  - Routine Hospital Admission (non-emergent condition) 0 []  - New Admissions / / Ordering NPWT, Apligraf, etc. 0 []  - Emergency Hospital Admission (emergent condition) 0 X - Simple Discharge Coordination 1 10 []  - Complex (extensive) Discharge Coordination 0 PROCESS - Special Needs []  - Pediatric / Minor Patient Management 0 []  - Isolation Patient Management 0 []  - Hearing / Language / Visual special needs 0 []  - Assessment of Community assistance (transportation, D/C planning, etc.) 0 []  - Additional assistance / Altered mentation 0 []  - Support Surface(s) Assessment (bed, cushion, seat, etc.) 0 INTERVENTIONS - Wound Cleansing /  Measurement X - Simple Wound Cleansing - one wound 1 5 []  - Complex Wound Cleansing - multiple wounds 0 X - Wound Imaging (photographs - any number of wounds) 1 5 []  -  Wound Tracing (instead of photographs) 0 X - Simple Wound Measurement - one wound 1 5 []  - Complex Wound Measurement - multiple wounds 0 INTERVENTIONS - Wound Dressings X - Small Wound Dressing one or multiple wounds 1 10 []  - Medium Wound Dressing one or multiple wounds 0 []  - Large Wound Dressing one or multiple wounds 0 X - Application of Medications - topical 1 5 []  - Application of Medications - injection 0 INTERVENTIONS - Miscellaneous []  - External ear exam 0 TANISA, LAGACE ( ) []  - Specimen Collection (cultures, biopsies, blood, body fluids, etc.) 0 []  - Specimen(s) / Culture(s) sent or taken to Lab for analysis 0 []  - Patient Transfer (multiple staff / Lift / Similar devices) 0 []  - Simple Staple / Suture removal (25 or less) 0 []  - Complex Staple / Suture removal (26 or more) 0 []  - Hypo / Hyperglycemic Management (close monitor of Blood Glucose) 0 []  - Ankle / Brachial Index (ABI) - do not check if billed separately 0 X - Vital Signs 1 5 Has the patient been seen at the hospital within the last three years: Yes Total Score: 90 Level Of Care: New/Established - Level 3 Electronic Signature(s) Signed: 09/19/2016 4:44:08 PM By: Entered By: [name] on 09/19/2016 16:41:48 ( ) -------------------------------------------------------------------------------- Encounter Discharge Information Details Patient Name: Date of Service: 09/19/2016 2:15 PM Medical Record Number: Patient Account Number: Date of Birth/Sex: 07-Dec-1930 (82 y.o. Female) Treating RN: 11/19/2016, Debi Primary Care Physician: Alejandro Mulling Other Clinician: Referring Physician: Alejandro Mulling Treating Physician/Extender: 11/19/2016 in Treatment: 11 Encounter Discharge Information Items Discharge Pain Level: 0 Discharge Condition: Stable Ambulatory Status: Cane Discharge Destination: Home Transportation: Private Auto Accompanied By: self Schedule Follow-up Appointment: Yes Medication Reconciliation completed Yes and provided to Patient/Care Shamell Hittle: Provided on Clinical Summary of Care: 09/19/2016 Form Type Recipient Paper Patient [facility] Electronic Signature(s) Signed: 09/19/2016 4:44:08 PM By: 11/19/2016 Previous Signature: 09/19/2016 2:52:32 PM Version By: [id number] Entered By: 13/05/1930 on 09/19/2016 15:01:39 Ashok Cordia (Aram Beecham) -------------------------------------------------------------------------------- Lower Extremity Assessment Details Patient Name: Aram Beecham Date of Service: 09/19/2016 2:15 PM Medical Record Number: 11/19/2016 Patient Account Number: [facility] Date of Birth/Sex: 1930/09/17 (82 y.o. Female) Treating RN: 11/19/2016, Debi Primary Care Physician: Gwenlyn Perking Other Clinician: Referring Physician: Alejandro Mulling Treating Physician/Extender: 11/19/2016 in Treatment: 11 Vascular Assessment Pulses: Posterior Tibial Dorsalis Pedis Palpable: [Left:Yes] Extremity colors, hair growth, and conditions: Extremity Color: [Left:Hyperpigmented] Temperature of Extremity: [Left:Cool] Capillary Refill: [Left:< 3 seconds] Toe Nail Assessment Left: Right: Thick: No Discolored: No Deformed: No Improper Length and Hygiene: No Electronic Signature(s) Signed: 09/19/2016 4:44:08 PM By: [id number] Entered By: [name] on 09/19/2016 14:31:08 [id number] ([id number]) -------------------------------------------------------------------------------- Multi Wound Chart Details Patient Name: 13/05/1930 Date of Service: 09/19/2016 2:15 PM Medical Record Number: Ashok Cordia Patient Account Number: Aram Beecham Date of  Birth/Sex: 1930/05/10 (82 y.o. Female) Treating RN: 11/19/2016 Primary Care Physician: Alejandro Mulling Other Clinician: Referring Physician: Alejandro Mulling Treating Physician/Extender: 11/19/2016 in Treatment: 11 Vital Signs Height(in): 60 Pulse(bpm): 64 Weight(lbs): 126 Blood Pressure 117/47 (mmHg): Body Mass Index(BMI): 25 Temperature(F): 97.9 Respiratory Rate 18 (breaths/min): Photos: [3:No Photos] [N/A:N/A] Wound Location: [3:Left Lower Leg - Medial N/A] Wounding Event: [3:Trauma] [N/A:N/A] Primary Etiology: [3:Trauma, Other] [N/A:N/A] Comorbid History: [3:Hypertension, Gout, Osteoarthritis, Neuropathy] [N/A:N/A] Date Acquired: [3:06/27/2016] [N/A:N/A] Weeks of Treatment: [3:11] [N/A:N/A] Wound Status: [3:Open] [N/A:N/A] Clustered Wound: [3:Yes] [N/A:N/A] Clustered Quantity: [  3:2] [N/A:N/A] Measurements L x W x D 0.1x0.1x0.1 [N/A:N/A] (cm) Area (cm) : [3:0.008] [N/A:N/A] Volume (cm) : [3:0.001] [N/A:N/A] % Reduction in Area: [3:100.00%] [N/A:N/A] % Reduction in Volume: 100.00% [N/A:N/A] Classification: [3:Partial Thickness] [N/A:N/A] Exudate Amount: [3:Small] [N/A:N/A] Exudate Type: [3:Serosanguineous] [N/A:N/A] Exudate Color: [3:red, brown] [N/A:N/A] Wound Margin: [3:Flat and Intact] [N/A:N/A] Granulation Amount: [3:Large (67-100%)] [N/A:N/A] Granulation Quality: [3:Red, Hyper-granulation] [N/A:N/A] Necrotic Amount: [3:None Present (0%)] [N/A:N/A] Exposed Structures: [3:Fascia: No Fat: No Tendon: No Muscle: No Joint: No] [N/A:N/A] Bone: No Limited to Skin Breakdown Epithelialization: Large (67-100%) N/A N/A Periwound Skin Texture: Scarring: Yes N/A N/A Edema: No Excoriation: No Induration: No Callus: No Crepitus: No Fluctuance: No Friable: No Rash: No Periwound Skin Moist: Yes N/A N/A Moisture: Maceration: No Dry/Scaly: No Periwound Skin Color: Ecchymosis: Yes N/A N/A Atrophie Blanche: No Cyanosis: No Erythema: No Hemosiderin  Staining: No Mottled: No Pallor: No Rubor: No Temperature: No Abnormality N/A N/A Tenderness on Yes N/A N/A Palpation: Wound Preparation: Ulcer Cleansing: N/A N/A Rinsed/Irrigated with Saline Topical Anesthetic Applied: Other: lidocaine 4% Treatment Notes Electronic Signature(s) Signed: 09/19/2016 4:44:08 PM By: Alejandro Mulling Entered By: Alejandro Mulling on 09/19/2016 14:40:18 [name] ([id number]) -------------------------------------------------------------------------------- Multi-Disciplinary Care Plan Details Patient Name: [name] Date of Service: 09/19/2016 2:15 PM Medical Record Number: [id number] Patient Account Number: [id number] Date of Birth/Sex: 07-Jan-1930 (82 y.o. Female) Treating RN: Ashok Cordia, Debi Primary Care Physician: Aram Beecham Other Clinician: Referring Physician: Aram Beecham Treating Physician/Extender: Eugene Garnet in Treatment: 11 Active Inactive Abuse / Safety / Falls / Self Care Management Nursing Diagnoses: Potential for falls Goals: Patient will remain injury free Date Initiated: 07/04/2016 Goal Status: Active Interventions: Assess fall risk on admission and as needed Notes: Soft Tissue Infection Nursing Diagnoses: Impaired tissue integrity Potential for infection: soft tissue Goals: Patient will remain free of wound infection Date Initiated: 07/04/2016 Goal Status: Active Interventions: Assess signs and symptoms of infection every visit Notes: Wound/Skin Impairment Nursing Diagnoses: Impaired tissue integrity Goals: Ulcer/skin breakdown will have a volume reduction of 30% by week 4 LAYTON, TAPPAN ([id number]) Date Initiated: 07/04/2016 Goal Status: Active Interventions: Assess ulceration(s) every visit Notes: Electronic Signature(s) Signed: 09/19/2016 4:44:08 PM By: Alejandro Mulling Entered By: Alejandro Mulling on 09/19/2016 14:39:28 [name]  ([id number]) -------------------------------------------------------------------------------- Pain Assessment Details Patient Name: [name] Date of Service: 09/19/2016 2:15 PM Medical Record Number: [id number] Patient Account Number: [id number] Date of Birth/Sex: [birth year] (82 y.o. Female) Treating RN: Ashok Cordia, Debi Primary Care Physician: Aram Beecham Other Clinician: Referring Physician: Aram Beecham Treating Physician/Extender: Eugene Garnet in Treatment: 11 Active Problems Location of Pain Severity and Description of Pain Patient Has Paino No Site Locations With Dressing Change: No Pain Management and Medication Current Pain Management: Electronic Signature(s) Signed: 09/19/2016 4:44:08 PM By: Alejandro Mulling Entered By: Alejandro Mulling on 09/19/2016 14:26:33 [name] ([id number]) -------------------------------------------------------------------------------- Patient/Caregiver Education Details Patient Name: [name] Date of Service: 09/19/2016 2:15 PM Medical Record Number: [id number] Patient Account Number: [id number] Date of Birth/Gender: [birth year] (82 y.o. Female) Treating RN: Ashok Cordia, Debi Primary Care Physician: Aram Beecham Other Clinician: Referring Physician: Aram Beecham Treating Physician/Extender: Eugene Garnet in Treatment: 11 Education Assessment Education Provided To: Patient Education Topics Provided Wound/Skin Impairment: Handouts: Other: change dressing as ordered Methods: Demonstration, Explain/Verbal Responses: State content correctly Electronic Signature(s) Signed: 09/19/2016 4:44:08 PM By: Alejandro Mulling Entered By: Alejandro Mulling on 09/19/2016 15:01:53 [name] ([id number]) -------------------------------------------------------------------------------- Wound Assessment Details Patient Name: [name] Date of Service: 09/19/2016 2:15 PM  Medical Record Number:  [id number] Patient Account Number: [id number] Date of Birth/Sex: 11/12/1930 (82 y.o. Female) Treating RN: Ashok Cordia, Debi Primary Care Physician: Aram Beecham Other Clinician: Referring Physician: Aram Beecham Treating Physician/Extender: Eugene Garnet in Treatment: 11 Wound Status Wound Number: 3 Primary Trauma, Other Etiology: Wound Location: Left Lower Leg - Medial Wound Status: Open Wounding Event: Trauma Comorbid Hypertension, Gout, Osteoarthritis, Date Acquired: 06/27/2016 History: Neuropathy Weeks Of Treatment: 11 Clustered Wound: Yes Wound Measurements Length: (cm) 0.1 Width: (cm) 0.1 Depth: (cm) 0.1 Clustered Quantity: 2 Area: (cm) 0.008 Volume: (cm) 0.001 % Reduction in Area: 100% % Reduction in Volume: 100% Epithelialization: Large (67-100%) Tunneling: No Undermining: No Wound Description Classification: Partial Thickness Wound Margin: Flat and Intact Exudate Amount: Small Exudate Type: Serosanguineous Exudate Color: red, brown Foul Odor After Cleansing: No Wound Bed Granulation Amount: Large (67-100%) Exposed Structure Granulation Quality: Red, Hyper-granulation Fascia Exposed: No Necrotic Amount: None Present (0%) Fat Layer Exposed: No Tendon Exposed: No Muscle Exposed: No Joint Exposed: No Bone Exposed: No Limited to Skin Breakdown Periwound Skin Texture Texture Color No Abnormalities Noted: No No Abnormalities Noted: No Callus: No Atrophie Blanche: No Crepitus: No Cyanosis: No Excoriation: No Ecchymosis: Yes DAFFANY, TOBIN ([id number]) Fluctuance: No Erythema: No Friable: No Hemosiderin Staining: No Induration: No Mottled: No Localized Edema: No Pallor: No Rash: No Rubor: No Scarring: Yes Temperature / Pain Moisture Temperature: No Abnormality No Abnormalities Noted: No Tenderness on Palpation: Yes Dry / Scaly: No Maceration: No Moist: Yes Wound Preparation Ulcer Cleansing: Rinsed/Irrigated with  Saline Topical Anesthetic Applied: Other: lidocaine 4%, Treatment Notes Wound #3 (Left, Medial Lower Leg) 1. Cleansed with: Clean wound with Normal Saline 2. Anesthetic Topical Lidocaine 4% cream to wound bed prior to debridement 4. Dressing Applied: Other dressing (specify in notes) 5. Secondary Dressing Applied Dry Gauze Non-Adherent pad Notes band-aide, iodosorb-gel Electronic Signature(s) Signed: 09/19/2016 4:44:08 PM By: Alejandro Mulling Entered By: Alejandro Mulling on 09/19/2016 14:35:13 [name] ([id number]) -------------------------------------------------------------------------------- Vitals Details Patient Name: [name] Date of Service: 09/19/2016 2:15 PM Medical Record Number: [id number] Patient Account Number: [id number] Date of Birth/Sex: [birth year] (82 y.o. Female) Treating RN: Ashok Cordia, Debi Primary Care Physician: Aram Beecham Other Clinician: Referring Physician: Aram Beecham Treating Physician/Extender: Eugene Garnet in Treatment: 11 Vital Signs Time Taken: 14:26 Temperature (F): 97.9 Height (in): 60 Pulse (bpm): 64 Weight (lbs): 126 Respiratory Rate (breaths/min): 18 Body Mass Index (BMI): 24.6 Blood Pressure (mmHg): 117/47 Reference Range: 80 - 120 mg / dl Notes Natasha Roan, NP aware of BP. Electronic Signature(s) Signed: 09/19/2016 4:44:08 PM By: Alejandro Mulling Entered By: Alejandro Mulling on 09/19/2016 14:30:57

## 2016-09-20 NOTE — Progress Notes (Signed)
[name], [name] ([id number]) Visit Report for 09/19/2016 Chief Complaint Document Details Patient Name: [name], [name]. Date of Service: 09/19/2016 2:15 PM Medical Record Number: [id number] Patient Account Number: [id number] Date of Birth/Sex: 1930-02-18 (82 y.o. Female) Treating RN: Ashok Cordia, Debi Primary Care Physician: Aram Beecham Other Clinician: Referring Physician: Aram Beecham Treating Physician/Extender: Eugene Garnet in Treatment: 11 Information Obtained from: Patient Chief Complaint Patient seen for complaints of Non-Healing Wound to the left lower extremity for about a week Electronic Signature(s) Signed: 09/19/2016 4:05:49 PM By: Georges Lynch FNP Entered By: Georges Lynch on 09/19/2016 14:57:06 [name] ([id number]) -------------------------------------------------------------------------------- HPI Details Patient Name: [name] Date of Service: 09/19/2016 2:15 PM Medical Record Number: [id number] Patient Account Number: [id number] Date of Birth/Sex: Nov 14, 1930 (82 y.o. Female) Treating RN: Phillis Haggis Primary Care Physician: Aram Beecham Other Clinician: Referring Physician: Aram Beecham Treating Physician/Extender: Eugene Garnet in Treatment: 11 History of Present Illness Location: left lower extremity Quality: Patient reports experiencing an aching pain to affected area(s). Severity: Patient states wound (s) are getting better. Duration: Patient has had the wound for < 1 weeks prior to presenting for treatment Timing: Pain in wound is Intermittent (comes and goes) Context: The wound occurred when the patient tripped and injured her left lower extremity where she had some skin tears. Modifying Factors: Other treatment(s) tried include: has been applying some Neosporin ointment over this Associated Signs and Symptoms: Patient reports having: no bleeding or discharge from the wound. HPI Description: 82 year old  patient who is looking much younger than his stated age comes in with a history of having a laceration to her left lower extremity which she sustained about a week ago. She has several medical comorbidities including degenerative arthritis, scoliosis, history of back surgery, pacemaker placement,AMA positive, ulnar neuropathy and left carpal tunnel syndrome. she is also had sclerotherapy for varicose veins in May 2003. her medications include some prednisone at the present time which she may be coming off soon. She went to the [facility] where they have been dressing her wound and she is hear for review. 08/18/2016 -- a small traumatic ulceration just superior medial to her previous wound and this was caused while she was trying to get her dressing off 09/19/16: returns today for ongoing evaluation and management of a left lower extremity wound, which is very small today. denies new wounds or skin breakdown. no systemic s/s of infection. Electronic Signature(s) Signed: 09/19/2016 4:05:49 PM By: Georges Lynch FNP Entered By: Georges Lynch on 09/19/2016 14:57:49 [name] ([id number]) -------------------------------------------------------------------------------- Physical Exam Details Patient Name: [name] Date of Service: 09/19/2016 2:15 PM Medical Record Number: [id number] Patient Account Number: [id number] Date of Birth/Sex: 19-Dec-1929 (82 y.o. Female) Treating RN: Phillis Haggis Primary Care Physician: Aram Beecham Other Clinician: Referring Physician: Aram Beecham Treating Physician/Extender: Eugene Garnet in Treatment: 11 Constitutional Patient's appearance is neat and clean. Appears in no acute distress. Well nourished and well developed.. Ears, Nose, Mouth, and Throat Patient can hear normal speaking tones without difficulty.. Cardiovascular Extremities are free of varicosities, clubbing or edema. Peripheral pulses strong and equal.  Capillary refill < 3 seconds.. Psychiatric Judgement and insight intact.. Alert and oriented times 3.. Short and long term memory intact.. No evidence of depression, anxiety, or agitation. Calm, cooperative, and communicative. Appropriate interactions and affect.. Electronic Signature(s) Signed: 09/19/2016 4:05:49 PM By: Georges Lynch FNP Entered By: Georges Lynch on 09/19/2016 14:58:21 [name] ([id number]) -------------------------------------------------------------------------------- Physician Orders Details Patient Name: [name] Date  of Service: 09/19/2016 2:15 PM Medical Record Number: [id number] Patient Account Number: [id number] Date of Birth/Sex: 1930-03-03 (82 y.o. Female) Treating RN: Ashok Cordia, Debi Primary Care Physician: Aram Beecham Other Clinician: Referring Physician: Aram Beecham Treating Physician/Extender: Eugene Garnet in Treatment: 11 Verbal / Phone Orders: Yes Clinician: Ashok Cordia, Debi Read Back and Verified: Yes Diagnosis Coding ICD-10 Coding Code Description [phone] Laceration without foreign body, left lower leg, initial encounter L97.222 Non-pressure chronic ulcer of left calf with fat layer exposed Wound Cleansing Wound #3 Left,Medial Lower Leg o Clean wound with Normal Saline. o May Shower, gently pat wound dry prior to applying new dressing. Anesthetic Wound #3 Left,Medial Lower Leg o Topical Lidocaine 4% cream applied to wound bed prior to debridement Primary Wound Dressing Wound #3 Left,Medial Lower Leg o Iodosorb Ointment Secondary Dressing Wound #3 Left,Medial Lower Leg o Dry Gauze o Non-adherent pad - band-aide Dressing Change Frequency Wound #3 Left,Medial Lower Leg o Change dressing every other day. Follow-up Appointments Wound #3 Left,Medial Lower Leg o Return Appointment in 1 week. Edema Control Wound #3 Left,Medial Lower Leg o Support Garment 30-40 mm/Hg pressure  to: [name], [name] ([id number]) Additional Orders / Instructions Wound #3 Left,Medial Lower Leg o Increase protein intake. o Activity as tolerated Electronic Signature(s) Signed: 09/19/2016 4:05:49 PM By: Georges Lynch FNP Signed: 09/19/2016 4:44:08 PM By: Alejandro Mulling Entered By: Alejandro Mulling on 09/19/2016 14:44:00 [name] ([id number]) -------------------------------------------------------------------------------- Problem List Details Patient Name: [name] Date of Service: 09/19/2016 2:15 PM Medical Record Number: [id number] Patient Account Number: [id number] Date of Birth/Sex: [birth year] (82 y.o. Female) Treating RN: Ashok Cordia, Debi Primary Care Physician: Aram Beecham Other Clinician: Referring Physician: Aram Beecham Treating Physician/Extender: Eugene Garnet in Treatment: 11 Active Problems ICD-10 Encounter Code Description Active Date Diagnosis S81.812A Laceration without foreign body, left lower leg, initial 07/04/2016 Yes encounter L97.222 Non-pressure chronic ulcer of left calf with fat layer 07/04/2016 Yes exposed Inactive Problems Resolved Problems Electronic Signature(s) Signed: 09/19/2016 4:05:49 PM By: Georges Lynch FNP Entered By: Georges Lynch on 09/19/2016 14:56:58 [name] ([id number]) -------------------------------------------------------------------------------- Progress Note Details Patient Name: [name] Date of Service: 09/19/2016 2:15 PM Medical Record Number: [id number] Patient Account Number: [id number] Date of Birth/Sex: 06-19-1930 (82 y.o. Female) Treating RN: Ashok Cordia, Debi Primary Care Physician: Aram Beecham Other Clinician: Referring Physician: Aram Beecham Treating Physician/Extender: Eugene Garnet in Treatment: 11 Subjective Chief Complaint Information obtained from Patient Patient seen for complaints of Non-Healing Wound to the left lower extremity for  about a week History of Present Illness (HPI) The following HPI elements were documented for the patient's wound: Location: left lower extremity Quality: Patient reports experiencing an aching pain to affected area(s). Severity: Patient states wound (s) are getting better. Duration: Patient has had the wound for < 1 weeks prior to presenting for treatment Timing: Pain in wound is Intermittent (comes and goes) Context: The wound occurred when the patient tripped and injured her left lower extremity where she had some skin tears. Modifying Factors: Other treatment(s) tried include: has been applying some Neosporin ointment over this Associated Signs and Symptoms: Patient reports having: no bleeding or discharge from the wound. 82 year old patient who is looking much younger than his stated age comes in with a history of having a laceration to her left lower extremity which she sustained about a week ago. She has several medical comorbidities including degenerative arthritis, scoliosis, history of back surgery, pacemaker placement,AMA positive, ulnar neuropathy and left carpal tunnel syndrome. she is also had  sclerotherapy for varicose veins in May 2003. her medications include some prednisone at the present time which she may be coming off soon. She went to the [facility] where they have been dressing her wound and she is hear for review. 08/18/2016 -- a small traumatic ulceration just superior medial to her previous wound and this was caused while she was trying to get her dressing off 09/19/16: returns today for ongoing evaluation and management of a left lower extremity wound, which is very small today. denies new wounds or skin breakdown. no systemic s/s of infection. Objective Constitutional Patient's appearance is neat and clean. Appears in no acute distress. Well nourished and well developed.Marland Kitchen [name], [name] Kitchen ([id number]) Vitals Time Taken: 2:26 PM, Height: 60 in, Weight: 126 lbs,  BMI: 24.6, Temperature: 97.9 F, Pulse: 64 bpm, Respiratory Rate: 18 breaths/min, Blood Pressure: 117/47 mmHg. General Notes: Natasha Roan, NP aware of BP. Ears, Nose, Mouth, and Throat Patient can hear normal speaking tones without difficulty.. Cardiovascular Extremities are free of varicosities, clubbing or edema. Peripheral pulses strong and equal. Capillary refill < 3 seconds.. Psychiatric Judgement and insight intact.. Alert and oriented times 3.. Short and long term memory intact.. No evidence of depression, anxiety, or agitation. Calm, cooperative, and communicative. Appropriate interactions and affect.. Integumentary (Hair, Skin) Wound #3 status is Open. Original cause of wound was Trauma. The wound is located on the Left,Medial Lower Leg. The wound measures 0.1cm length x 0.1cm width x 0.1cm depth; 0.008cm^2 area and 0.001cm^3 volume. The wound is limited to skin breakdown. There is no tunneling or undermining noted. There is a small amount of serosanguineous drainage noted. The wound margin is flat and intact. There is large (67-100%) red granulation within the wound bed. There is no necrotic tissue within the wound bed. The periwound skin appearance exhibited: Scarring, Moist, Ecchymosis. The periwound skin appearance did not exhibit: Callus, Crepitus, Excoriation, Fluctuance, Friable, Induration, Localized Edema, Rash, Dry/Scaly, Maceration, Atrophie Blanche, Cyanosis, Hemosiderin Staining, Mottled, Pallor, Rubor, Erythema. Periwound temperature was noted as No Abnormality. The periwound has tenderness on palpation. Assessment Active Problems ICD-10 S81.812A - Laceration without foreign body, left lower leg, initial encounter I45.809 - Non-pressure chronic ulcer of left calf with fat layer exposed Plan Wound Cleansing: TNIA, SZOSTEK ([id number]) Wound #3 Left,Medial Lower Leg: Clean wound with Normal Saline. May Shower, gently pat wound dry prior to applying new  dressing. Anesthetic: Wound #3 Left,Medial Lower Leg: Topical Lidocaine 4% cream applied to wound bed prior to debridement Primary Wound Dressing: Wound #3 Left,Medial Lower Leg: Iodosorb Ointment Secondary Dressing: Wound #3 Left,Medial Lower Leg: Dry Gauze Non-adherent pad - band-aide Dressing Change Frequency: Wound #3 Left,Medial Lower Leg: Change dressing every other day. Follow-up Appointments: Wound #3 Left,Medial Lower Leg: Return Appointment in 1 week. Edema Control: Wound #3 Left,Medial Lower Leg: Support Garment 30-40 mm/Hg pressure to: Additional Orders / Instructions: Wound #3 Left,Medial Lower Leg: Increase protein intake. Activity as tolerated Follow-Up Appointments: A Patient Clinical Summary of Care was provided to [facility] 1. discussed clinical findings and implications with pt. all questions were answered. 2. d/c current wound dressings. 3. Iodosorb to wound qod and prn. Electronic Signature(s) Signed: 09/19/2016 4:05:49 PM By: Georges Lynch FNP Entered By: Georges Lynch on 09/19/2016 15:00:08 [name] ([id number]) -------------------------------------------------------------------------------- SuperBill Details Patient Name: [name] Date of Service: 09/19/2016 Medical Record Number: [id number] Patient Account Number: [id number] Date of Birth/Sex: August 20, 1930 (82 y.o. Female) Treating RN: Ashok Cordia, Debi Primary Care Physician: Aram Beecham Other Clinician: Referring  Physician: Aram Beecham Treating Physician/Extender: Eugene Garnet in Treatment: 11 Diagnosis Coding ICD-10 Codes Code Description [phone] Laceration without foreign body, left lower leg, initial encounter L97.222 Non-pressure chronic ulcer of left calf with fat layer exposed Facility Procedures CPT4 Code: [id number] Description: 99213 - WOUND CARE VISIT-LEV 3 EST PT Modifier: Quantity: 1 Physician Procedures CPT4 Code Description: [id number] 99213 - WC PHYS  LEVEL 3 - EST PT ICD-10 Description Diagnosis S81.812A Laceration without foreign body, left lower leg, ini L97.222 Non-pressure chronic ulcer of left calf with fat lay Modifier: tial encounter er exposed Quantity: 1 Electronic Signature(s) Signed: 09/19/2016 4:44:08 PM By: Alejandro Mulling Previous Signature: 09/19/2016 4:05:49 PM Version By: Georges Lynch FNP Entered By: Alejandro Mulling on 09/19/2016 16:42:07

## 2016-09-26 ENCOUNTER — Encounter (HOSPITAL_BASED_OUTPATIENT_CLINIC_OR_DEPARTMENT_OTHER): Payer: Medicare Other | Admitting: General Surgery

## 2016-09-26 DIAGNOSIS — L97222 Non-pressure chronic ulcer of left calf with fat layer exposed: Secondary | ICD-10-CM

## 2016-09-26 DIAGNOSIS — S81812A Laceration without foreign body, left lower leg, initial encounter: Secondary | ICD-10-CM | POA: Diagnosis not present

## 2016-09-26 NOTE — Progress Notes (Signed)
ADLYNN, LARIOS ([id number]) Visit Report for 09/26/2016 Arrival Information Details Patient Name: [name], [name]. Date of Service: 09/26/2016 1:30 PM Medical Record Number: [id number] Patient Account Number: [id number] Date of Birth/Sex: 02-09-1930 (82 y.o. Female) Treating RN: [name], [name] Primary Care Physician: Aram Beecham Other Clinician: Referring Physician: Aram Beecham Treating Physician/Extender: [name] in Treatment: 12 Visit Information History Since Last Visit All ordered tests and consults were completed: No Patient Arrived: [name] Added or deleted any medications: No Arrival Time: 13:32 Any new allergies or adverse reactions: No Accompanied By: self Had a fall or experienced change in No Transfer Assistance: None activities of daily living that may affect Patient Identification Verified: Yes risk of falls: Secondary Verification Process Completed: Yes Signs or symptoms of abuse/neglect since last No Patient Requires Transmission-Based No visito Precautions: Hospitalized since last visit: No Patient Has Alerts: No Pain Present Now: No Electronic Signature(s) Signed: 09/26/2016 4:03:46 PM By: [name] Entered By: [name] on 09/26/2016 13:35:09 [name] ([id number]) -------------------------------------------------------------------------------- Clinic Level of Care Assessment Details Patient Name: [name] Date of Service: 09/26/2016 1:30 PM Medical Record Number: [id number] Patient Account Number: [id number] Date of Birth/Sex: 11-Oct-1930 (82 y.o. Female) Treating RN: [name], [name] Primary Care Physician: Aram Beecham Other Clinician: Referring Physician: Aram Beecham Treating Physician/Extender: [name] in Treatment: 12 Clinic Level of Care Assessment Items TOOL 4 Quantity Score X - Use when only an EandM is performed on FOLLOW-UP visit 1 0 ASSESSMENTS - Nursing Assessment /  Reassessment X - Reassessment of Co-morbidities (includes updates in patient status) 1 10 X - Reassessment of Adherence to Treatment Plan 1 5 ASSESSMENTS - Wound and Skin Assessment / Reassessment X - Simple Wound Assessment / Reassessment - one wound 1 5 []  - Complex Wound Assessment / Reassessment - multiple wounds 0 []  - Dermatologic / Skin Assessment (not related to wound area) 0 ASSESSMENTS - Focused Assessment []  - Circumferential Edema Measurements - multi extremities 0 []  - Nutritional Assessment / Counseling / Intervention 0 []  - Lower Extremity Assessment (monofilament, tuning fork, pulses) 0 []  - Peripheral Arterial Disease Assessment (using hand held doppler) 0 ASSESSMENTS - Ostomy and/or Continence Assessment and Care []  - Incontinence Assessment and Management 0 []  - Ostomy Care Assessment and Management (repouching, etc.) 0 PROCESS - Coordination of Care X - Simple Patient / Family Education for ongoing care 1 15 []  - Complex (extensive) Patient / Family Education for ongoing care 0 X - Staff obtains Chiropractor, Records, Test Results / Process Orders 1 10 []  - Staff telephones HHA, Nursing Homes / Clarify orders / etc 0 []  - Routine Transfer to another Facility (non-emergent condition) 0 [name], [name] ([id number]) []  - Routine Hospital Admission (non-emergent condition) 0 []  - New Admissions / Manufacturing engineer / Ordering NPWT, Apligraf, etc. 0 []  - Emergency Hospital Admission (emergent condition) 0 X - Simple Discharge Coordination 1 10 []  - Complex (extensive) Discharge Coordination 0 PROCESS - Special Needs []  - Pediatric / Minor Patient Management 0 []  - Isolation Patient Management 0 []  - Hearing / Language / Visual special needs 0 []  - Assessment of Community assistance (transportation, D/C planning, etc.) 0 []  - Additional assistance / Altered mentation 0 []  - Support Surface(s) Assessment (bed, cushion, seat, etc.) 0 INTERVENTIONS - Wound Cleansing /  Measurement X - Simple Wound Cleansing - one wound 1 5 []  - Complex Wound Cleansing - multiple wounds 0 X - Wound Imaging (photographs - any number of wounds) 1 5 []  -  Wound Tracing (instead of photographs) 0 []  - Simple Wound Measurement - one wound 0 []  - Complex Wound Measurement - multiple wounds 0 INTERVENTIONS - Wound Dressings []  - Small Wound Dressing one or multiple wounds 0 []  - Medium Wound Dressing one or multiple wounds 0 []  - Large Wound Dressing one or multiple wounds 0 []  - Application of Medications - topical 0 []  - Application of Medications - injection 0 INTERVENTIONS - Miscellaneous []  - External ear exam 0 [name], [name] ([id number]) []  - Specimen Collection (cultures, biopsies, blood, body fluids, etc.) 0 []  - Specimen(s) / Culture(s) sent or taken to Lab for analysis 0 []  - Patient Transfer (multiple staff / Michiel Sites Lift / Similar devices) 0 []  - Simple Staple / Suture removal (25 or less) 0 []  - Complex Staple / Suture removal (26 or more) 0 []  - Hypo / Hyperglycemic Management (close monitor of Blood Glucose) 0 []  - Ankle / Brachial Index (ABI) - do not check if billed separately 0 X - Vital Signs 1 5 Has the patient been seen at the hospital within the last three years: Yes Total Score: 70 Level Of Care: New/Established - Level 2 Electronic Signature(s) Signed: 09/26/2016 4:03:46 PM By: [name] Entered By: [name] on 09/26/2016 15:16:20 [name] ([id number]) -------------------------------------------------------------------------------- Encounter Discharge Information Details Patient Name: [name] Date of Service: 09/26/2016 1:30 PM Medical Record Number: [id number] Patient Account Number: [id number] Date of Birth/Sex: 1930/06/16 (82 y.o. Female) Treating RN: [name] Primary Care Physician: Aram Beecham Other Clinician: Referring Physician: Aram Beecham Treating Physician/Extender: [name] in  Treatment: 12 Encounter Discharge Information Items Discharge Pain Level: 0 Discharge Condition: Stable Ambulatory Status: Cane Discharge Destination: Home Transportation: Private Auto Accompanied By: self Schedule Follow-up Appointment: No Medication Reconciliation completed and provided to Patient/Care Yes [name]: Provided on Clinical Summary of Care: 09/26/2016 Form Type Recipient Paper Patient [facility] Electronic Signature(s) Signed: 09/26/2016 2:20:42 PM By: Natasha Sax MD Previous Signature: 09/26/2016 2:03:27 PM Version By: Gwenlyn Perking Entered By: [name] on 09/26/2016 14:20:41 [name] ([id number]) -------------------------------------------------------------------------------- Lower Extremity Assessment Details Patient Name: [name] Date of Service: 09/26/2016 1:30 PM Medical Record Number: [id number] Patient Account Number: [id number] Date of Birth/Sex: [birth year] (82 y.o. Female) Treating RN: [name], [name] Primary Care Physician: Aram Beecham Other Clinician: Referring Physician: Aram Beecham Treating Physician/Extender: [name] in Treatment: 12 Vascular Assessment Pulses: Posterior Tibial Dorsalis Pedis Palpable: [Left:Yes] Extremity colors, hair growth, and conditions: Extremity Color: [Left:Hyperpigmented] Temperature of Extremity: [Left:Warm] Capillary Refill: [Left:< 3 seconds] Electronic Signature(s) Signed: 09/26/2016 4:03:46 PM By: [name] Entered By: [name] on 09/26/2016 13:37:56 [name] ([id number]) -------------------------------------------------------------------------------- Multi Wound Chart Details Patient Name: [name] Date of Service: 09/26/2016 1:30 PM Medical Record Number: [id number] Patient Account Number: [id number] Date of Birth/Sex: [birth year] (82 y.o. Female) Treating RN: [name] Primary Care Physician: Aram Beecham Other  Clinician: Referring Physician: Aram Beecham Treating Physician/Extender: [name] in Treatment: 12 Vital Signs Height(in): 60 Pulse(bpm): 66 Weight(lbs): 126 Blood Pressure 109/40 (mmHg): Body Mass Index(BMI): 25 Temperature(F): 97.6 Respiratory Rate 18 (breaths/min): Photos: [3:No Photos] [N/A:N/A] Wound Location: [3:Left Lower Leg - Medial N/A] Wounding Event: [3:Trauma] [N/A:N/A] Primary Etiology: [3:Trauma, Other] [N/A:N/A] Comorbid History: [3:Hypertension, Gout, Osteoarthritis, Neuropathy] [N/A:N/A] Date Acquired: [3:06/27/2016] [N/A:N/A] Weeks of Treatment: [3:12] [N/A:N/A] Wound Status: [3:Open] [N/A:N/A] Clustered Wound: [3:Yes] [N/A:N/A] Clustered Quantity: [3:2] [N/A:N/A] Measurements L x W x D 0x0x0 [N/A:N/A] (cm) Area (cm) : [3:0] [N/A:N/A] Volume (cm) : [  3:0] [N/A:N/A] % Reduction in Area: [3:100.00%] [N/A:N/A] % Reduction in Volume: 100.00% [N/A:N/A] Classification: [3:Partial Thickness] [N/A:N/A] Exudate Amount: [3:None Present] [N/A:N/A] Wound Margin: [3:Flat and Intact] [N/A:N/A] Granulation Amount: [3:None Present (0%)] [N/A:N/A] Necrotic Amount: [3:None Present (0%)] [N/A:N/A] Exposed Structures: [3:Fascia: No Fat: No Tendon: No Muscle: No Joint: No Bone: No Limited to Skin Breakdown] [N/A:N/A] Epithelialization: Large (67-100%) N/A N/A Periwound Skin Texture: Scarring: Yes N/A N/A Edema: No Excoriation: No Induration: No Callus: No Crepitus: No Fluctuance: No Friable: No Rash: No Periwound Skin Moist: Yes N/A N/A Moisture: Maceration: No Dry/Scaly: No Periwound Skin Color: Ecchymosis: Yes N/A N/A Atrophie Blanche: No Cyanosis: No Erythema: No Hemosiderin Staining: No Mottled: No Pallor: No Rubor: No Temperature: No Abnormality N/A N/A Tenderness on Yes N/A N/A Palpation: Wound Preparation: Ulcer Cleansing: N/A N/A Rinsed/Irrigated with Saline Topical Anesthetic Applied: None Treatment Notes Electronic  Signature(s) Signed: 09/26/2016 4:03:46 PM By: [name] Entered By: [name] on 09/26/2016 14:00:01 [name] ([id number]) -------------------------------------------------------------------------------- Multi-Disciplinary Care Plan Details Patient Name: [name] Date of Service: 09/26/2016 1:30 PM Medical Record Number: [id number] Patient Account Number: [id number] Date of Birth/Sex: Apr 20, 1930 (82 y.o. Female) Treating RN: [name], [name] Primary Care Physician: Aram Beecham Other Clinician: Referring Physician: Aram Beecham Treating Physician/Extender: [name] in Treatment: 12 Active Inactive Electronic Signature(s) Signed: 09/26/2016 4:03:46 PM By: [name] Entered By: [name] on 09/26/2016 13:59:45 [name] ([id number]) -------------------------------------------------------------------------------- Pain Assessment Details Patient Name: [name] Date of Service: 09/26/2016 1:30 PM Medical Record Number: [id number] Patient Account Number: [id number] Date of Birth/Sex: [birth year] (82 y.o. Female) Treating RN: [name] Primary Care Physician: Aram Beecham Other Clinician: Referring Physician: Aram Beecham Treating Physician/Extender: [name] in Treatment: 12 Active Problems Location of Pain Severity and Description of Pain Patient Has Paino No Site Locations With Dressing Change: No Pain Management and Medication Current Pain Management: Electronic Signature(s) Signed: 09/26/2016 4:03:46 PM By: [name] Entered By: [name] on 09/26/2016 13:35:31 [name] ([id number]) -------------------------------------------------------------------------------- Patient/Caregiver Education Details Patient Name: [name] Date of Service: 09/26/2016 1:30 PM Medical Record Number: [id number] Patient Account Number: [id number] Date of Birth/Gender:  [birth year] (82 y.o. Female) Treating RN: [name] Primary Care Physician: Aram Beecham Other Clinician: Referring Physician: Aram Beecham Treating Physician/Extender: [name] in Treatment: 12 Education Assessment Education Provided To: Patient Education Topics Provided Wound/Skin Impairment: Handouts: Other: Please call our office if you have any questions or concerns. Methods: Explain/Verbal Responses: State content correctly Electronic Signature(s) Signed: 09/26/2016 3:40:17 PM By: Natasha Sax MD Entered By: [name] on 09/26/2016 14:20:51 [name] ([id number]) -------------------------------------------------------------------------------- Wound Assessment Details Patient Name: [name] Date of Service: 09/26/2016 1:30 PM Medical Record Number: [id number] Patient Account Number: [id number] Date of Birth/Sex: [birth year] (82 y.o. Female) Treating RN: [name], [name] Primary Care Physician: Aram Beecham Other Clinician: Referring Physician: Aram Beecham Treating Physician/Extender: [name] in Treatment: 12 Wound Status Wound Number: 3 Primary Trauma, Other Etiology: Wound Location: Left Lower Leg - Medial Wound Status: Open Wounding Event: Trauma Comorbid Hypertension, Gout, Osteoarthritis, Date Acquired: 06/27/2016 History: Neuropathy Weeks Of Treatment: 12 Clustered Wound: Yes Photos Photo Uploaded By: [name] on 09/26/2016 16:13:27 Wound Measurements Length: (cm) 0 Width: (cm) 0 Depth: (cm) 0 Clustered Quantity: 2 Area: (cm) 0 Volume: (cm) 0 % Reduction in Area: 100% % Reduction in Volume: 100% Epithelialization: Large (67-100%) Tunneling: No Undermining: No Wound Description Classification: Partial Thickness Wound Margin: Flat and Intact Exudate Amount: None  Present Foul Odor After Cleansing: No Wound Bed Granulation Amount: None Present (0%) Exposed Structure Necrotic Amount:  None Present (0%) Fascia Exposed: No Fat Layer Exposed: No Tendon Exposed: No Muscle Exposed: No MAHALIA, DYKES ([id number]) Joint Exposed: No Bone Exposed: No Limited to Skin Breakdown Periwound Skin Texture Texture Color No Abnormalities Noted: No No Abnormalities Noted: No Callus: No Atrophie Blanche: No Crepitus: No Cyanosis: No Excoriation: No Ecchymosis: Yes Fluctuance: No Erythema: No Friable: No Hemosiderin Staining: No Induration: No Mottled: No Localized Edema: No Pallor: No Rash: No Rubor: No Scarring: Yes Temperature / Pain Moisture Temperature: No Abnormality No Abnormalities Noted: No Tenderness on Palpation: Yes Dry / Scaly: No Maceration: No Moist: Yes Wound Preparation Ulcer Cleansing: Rinsed/Irrigated with Saline Topical Anesthetic Applied: None Electronic Signature(s) Signed: 09/26/2016 4:03:46 PM By: [name] Entered By: [name] on 09/26/2016 13:41:24 [name] ([id number]) -------------------------------------------------------------------------------- Vitals Details Patient Name: [name] Date of Service: 09/26/2016 1:30 PM Medical Record Number: [id number] Patient Account Number: [id number] Date of Birth/Sex: 11-02-1930 (82 y.o. Female) Treating RN: [name], [name] Primary Care Physician: Aram Beecham Other Clinician: Referring Physician: Aram Beecham Treating Physician/Extender: [name] in Treatment: 12 Vital Signs Time Taken: 13:35 Temperature (F): 97.6 Height (in): 60 Pulse (bpm): 66 Weight (lbs): 126 Respiratory Rate (breaths/min): 18 Body Mass Index (BMI): 24.6 Blood Pressure (mmHg): 109/40 Reference Range: 80 - 120 mg / dl Notes Made Dr. Jimmey Ralph aware of pt's BP. Electronic Signature(s) Signed: 09/26/2016 4:03:46 PM By: [name] Entered By: [name] on 09/26/2016 13:37:44

## 2016-09-26 NOTE — Progress Notes (Signed)
ANAY, WALTER ([id number]) Visit Report for 09/26/2016 Chief Complaint Document Details Patient Name: [name], [name] 09/26/2016 1:30 Date of Service: PM Medical Record [id number] Number: Patient Account Number: [id number] Aug 22, 1930 (82 y.o. Treating RN: Phillis Haggis Date of Birth/Sex: Female) Other Clinician: Primary Care Physician: Audie Clear, Fantasha Daniele Referring Physician: Aram Beecham Physician/Extender: Weeks in Treatment: 12 Information Obtained from: Patient Chief Complaint Patient seen for complaints of Non-Healing Wound to the left lower extremity for about a week Electronic Signature(s) Signed: 09/26/2016 2:15:34 PM By: Ardath Sax MD Entered By: Ardath Sax on 09/26/2016 14:15:34 [name] ([id number]) -------------------------------------------------------------------------------- HPI Details Patient Name: [name], [name] 09/26/2016 1:30 Date of Service: PM Medical Record [id number] Number: Patient Account Number: [id number] [birth year] (82 y.o. Treating RN: Phillis Haggis Date of Birth/Sex: Female) Other Clinician: Primary Care Physician: Audie Clear, Jourden Gilson Referring Physician: Aram Beecham Physician/Extender: Weeks in Treatment: 12 History of Present Illness Location: left lower extremity Quality: Patient reports experiencing an aching pain to affected area(s). Severity: Patient states wound (s) are getting better. Duration: Patient has had the wound for < 1 weeks prior to presenting for treatment Timing: Pain in wound is Intermittent (comes and goes) Context: The wound occurred when the patient tripped and injured her left lower extremity where she had some skin tears. Modifying Factors: Other treatment(s) tried include: has been applying some Neosporin ointment over this Associated Signs and Symptoms: Patient reports having: no bleeding or discharge from the wound. HPI Description: 82 year old  patient who is looking much younger than his stated age comes in with a history of having a laceration to her left lower extremity which she sustained about a week ago. She has several medical comorbidities including degenerative arthritis, scoliosis, history of back surgery, pacemaker placement,AMA positive, ulnar neuropathy and left carpal tunnel syndrome. she is also had sclerotherapy for varicose veins in May 2003. her medications include some prednisone at the present time which she may be coming off soon. She went to the [facility] where they have been dressing her wound and she is hear for review. 08/18/2016 -- a small traumatic ulceration just superior medial to her previous wound and this was caused while she was trying to get her dressing off 09/19/16: returns today for ongoing evaluation and management of a left lower extremity wound, which is very small today. denies new wounds or skin breakdown. no systemic s/s of infection. Electronic Signature(s) Signed: 09/26/2016 2:16:16 PM By: Ardath Sax MD Entered By: Ardath Sax on 09/26/2016 14:16:16 [name] ([id number]) -------------------------------------------------------------------------------- Physical Exam Details Patient Name: [name], [name] 09/26/2016 1:30 Date of Service: PM Medical Record [id number] Number: Patient Account Number: [id number] Jul 24, 1977 (82 y.o. Treating RN: Phillis Haggis Date of Birth/Sex: Female) Other Clinician: Primary Care Physician: Audie Clear, Bryer Cozzolino Referring Physician: Aram Beecham Physician/Extender: Tania Ade in Treatment: 12 Electronic Signature(s) Signed: 09/26/2016 2:16:28 PM By: Ardath Sax MD Entered By: Ardath Sax on 09/26/2016 14:16:28 [name] ([id number]) -------------------------------------------------------------------------------- Physician Orders Details Patient Name: [name], [name] 09/26/2016 1:30 Date of  Service: PM Medical Record [id number] Number: Patient Account Number: [id number] 06/15/1977 (82 y.o. Treating RN: Phillis Haggis Date of Birth/Sex: Female) Other Clinician: Primary Care Physician: Audie Clear, Rajvir Ernster Referring Physician: Aram Beecham Physician/Extender: Tania Ade in Treatment: 12 Verbal / Phone Orders: Yes Clinician: Ashok Cordia, Debi Read Back and Verified: Yes Diagnosis Coding Discharge From [facility] o Discharge from Wound Care Center - Please call our office if you have any questions or concerns.  Electronic Signature(s) Signed: 09/26/2016 3:40:17 PM By: Ardath Sax MD Signed: 09/26/2016 4:03:46 PM By: Alejandro Mulling Entered By: Alejandro Mulling on 09/26/2016 14:00:40 [name] ([id number]) -------------------------------------------------------------------------------- Problem List Details Patient Name: [name], [name] 09/26/2016 1:30 Date of Service: PM Medical Record [id number] Number: Patient Account Number: [id number] July 22, 1977 (82 y.o. Treating RN: Phillis Haggis Date of Birth/Sex: Female) Other Clinician: Primary Care Physician: Audie Clear, Gerarda Conklin Referring Physician: Aram Beecham Physician/Extender: Weeks in Treatment: 12 Active Problems ICD-10 Encounter Code Description Active Date Diagnosis S81.812A Laceration without foreign body, left lower leg, initial 07/04/2016 Yes encounter L97.222 Non-pressure chronic ulcer of left calf with fat layer 07/04/2016 Yes exposed Inactive Problems Resolved Problems Electronic Signature(s) Signed: 09/26/2016 2:15:09 PM By: Ardath Sax MD Entered By: Ardath Sax on 09/26/2016 14:15:09 [name] ([id number]) -------------------------------------------------------------------------------- Progress Note Details Patient Name: [name], [name] 09/26/2016 1:30 Date of Service: PM Medical Record [id number] Number: Patient Account Number:  [id number] May 06, 1977 (82 y.o. Treating RN: Phillis Haggis Date of Birth/Sex: Female) Other Clinician: Primary Care Physician: Audie Clear, Afrah Burlison Referring Physician: Aram Beecham Physician/Extender: Weeks in Treatment: 12 Subjective Chief Complaint Information obtained from Patient Patient seen for complaints of Non-Healing Wound to the left lower extremity for about a week History of Present Illness (HPI) The following HPI elements were documented for the patient's wound: Location: left lower extremity Quality: Patient reports experiencing an aching pain to affected area(s). Severity: Patient states wound (s) are getting better. Duration: Patient has had the wound for < 1 weeks prior to presenting for treatment Timing: Pain in wound is Intermittent (comes and goes) Context: The wound occurred when the patient tripped and injured her left lower extremity where she had some skin tears. Modifying Factors: Other treatment(s) tried include: has been applying some Neosporin ointment over this Associated Signs and Symptoms: Patient reports having: no bleeding or discharge from the wound. 82 year old patient who is looking much younger than his stated age comes in with a history of having a laceration to her left lower extremity which she sustained about a week ago. She has several medical comorbidities including degenerative arthritis, scoliosis, history of back surgery, pacemaker placement,AMA positive, ulnar neuropathy and left carpal tunnel syndrome. she is also had sclerotherapy for varicose veins in May 2003. her medications include some prednisone at the present time which she may be coming off soon. She went to the [facility] where they have been dressing her wound and she is hear for review. 08/18/2016 -- a small traumatic ulceration just superior medial to her previous wound and this was caused while she was trying to get her dressing off 09/19/16:  returns today for ongoing evaluation and management of a left lower extremity wound, which is very small today. denies new wounds or skin breakdown. no systemic s/s of infection. Objective Constitutional [name], [name] ([id number]) Vitals Time Taken: 1:35 PM, Height: 60 in, Weight: 126 lbs, BMI: 24.6, Temperature: 97.6 F, Pulse: 66 bpm, Respiratory Rate: 18 breaths/min, Blood Pressure: 109/40 mmHg. General Notes: Made Dr. Jimmey Ralph aware of pt's BP. Integumentary (Hair, Skin) Wound #3 status is Open. Original cause of wound was Trauma. The wound is located on the Left,Medial Lower Leg. The wound measures 0cm length x 0cm width x 0cm depth; 0cm^2 area and 0cm^3 volume. The wound is limited to skin breakdown. There is no tunneling or undermining noted. There is a none present amount of drainage noted. The wound margin is flat and intact. There is no granulation within  the wound bed. There is no necrotic tissue within the wound bed. The periwound skin appearance exhibited: Scarring, Moist, Ecchymosis. The periwound skin appearance did not exhibit: Callus, Crepitus, Excoriation, Fluctuance, Friable, Induration, Localized Edema, Rash, Dry/Scaly, Maceration, Atrophie Blanche, Cyanosis, Hemosiderin Staining, Mottled, Pallor, Rubor, Erythema. Periwound temperature was noted as No Abnormality. The periwound has tenderness on palpation. Traumatic wound almost healed. Contiue collagen. RTC prn Assessment Active Problems ICD-10 S81.812A - Laceration without foreign body, left lower leg, initial encounter L97.222 - Non-pressure chronic ulcer of left calf with fat layer exposed Plan Discharge From [facility]: Discharge from Wound Care Center - Please call our office if you have any questions or concerns. Follow-Up Appointments: Medication Reconciliation completed and provided to Patient/Care Provider. A Patient Clinical Summary of Care was provided to [facility] [name], [name] ([id number]) Electronic  Signature(s) Signed: 09/26/2016 2:19:35 PM By: Ardath Sax MD Entered By: Ardath Sax on 09/26/2016 14:19:35 [name] ([id number]) -------------------------------------------------------------------------------- SuperBill Details Patient Name: [name] Date of Service: 09/26/2016 Medical Record Number: [id number] Patient Account Number: [id number] Date of Birth/Sex: February 10, 1930 (82 y.o. Female) Treating RN: Ashok Cordia, Debi Primary Care Physician: Aram Beecham Other Clinician: Referring Physician: Aram Beecham Treating Physician/Extender: Elayne Snare in Treatment: 12 Diagnosis Coding ICD-10 Codes Code Description [phone] Laceration without foreign body, left lower leg, initial encounter L97.222 Non-pressure chronic ulcer of left calf with fat layer exposed Facility Procedures CPT4 Code: [id number] Description: [phone] - WOUND CARE VISIT-LEV 2 EST PT Modifier: Quantity: 1 Physician Procedures CPT4 Code Description: [id number] - WC PHYS LEVEL 2 - EST PT ICD-10 Description Diagnosis S81.812A Laceration without foreign body, left lower leg, ini L97.222 Non-pressure chronic ulcer of left calf with fat lay Modifier: tial encounter er exposed Quantity: 1 Electronic Signature(s) Signed: 09/26/2016 3:40:17 PM By: Ardath Sax MD Signed: 09/26/2016 4:03:46 PM By: Alejandro Mulling Previous Signature: 09/26/2016 2:20:22 PM Version By: Ardath Sax MD Entered By: Alejandro Mulling on 09/26/2016 15:16:32

## 2016-09-26 NOTE — Progress Notes (Signed)
See I heal 

## 2016-10-04 DIAGNOSIS — R270 Ataxia, unspecified: Secondary | ICD-10-CM | POA: Insufficient documentation

## 2016-10-04 DIAGNOSIS — G2581 Restless legs syndrome: Secondary | ICD-10-CM | POA: Insufficient documentation

## 2016-10-12 ENCOUNTER — Other Ambulatory Visit: Payer: Self-pay | Admitting: Neurology

## 2016-10-12 DIAGNOSIS — R270 Ataxia, unspecified: Secondary | ICD-10-CM

## 2016-10-24 ENCOUNTER — Ambulatory Visit
Admission: RE | Admit: 2016-10-24 | Discharge: 2016-10-24 | Disposition: A | Discharge status: Home / Self Care | Payer: Medicare Other | Source: Ambulatory Visit | Attending: Neurology | Admitting: Neurology

## 2016-10-24 DIAGNOSIS — G319 Degenerative disease of nervous system, unspecified: Secondary | ICD-10-CM | POA: Diagnosis not present

## 2016-10-24 DIAGNOSIS — R269 Unspecified abnormalities of gait and mobility: Secondary | ICD-10-CM | POA: Diagnosis present

## 2016-10-24 DIAGNOSIS — R270 Ataxia, unspecified: Secondary | ICD-10-CM

## 2016-10-24 DIAGNOSIS — J0130 Acute sphenoidal sinusitis, unspecified: Secondary | ICD-10-CM | POA: Insufficient documentation

## 2016-11-28 ENCOUNTER — Ambulatory Visit
Admission: RE | Admit: 2016-11-28 | Discharge: 2016-11-28 | Disposition: A | Discharge status: Home / Self Care | Payer: Medicare Other | Source: Ambulatory Visit | Attending: Internal Medicine | Admitting: Internal Medicine

## 2016-11-28 DIAGNOSIS — Z1231 Encounter for screening mammogram for malignant neoplasm of breast: Secondary | ICD-10-CM | POA: Diagnosis not present

## 2017-01-10 ENCOUNTER — Other Ambulatory Visit: Payer: Self-pay | Admitting: Physician Assistant

## 2017-01-10 DIAGNOSIS — M5416 Radiculopathy, lumbar region: Secondary | ICD-10-CM

## 2017-01-10 DIAGNOSIS — M5412 Radiculopathy, cervical region: Secondary | ICD-10-CM

## 2017-01-27 ENCOUNTER — Ambulatory Visit
Admission: RE | Admit: 2017-01-27 | Discharge: 2017-01-27 | Disposition: A | Discharge status: Home / Self Care | Payer: Medicare Other | Source: Ambulatory Visit | Attending: Physician Assistant | Admitting: Physician Assistant

## 2017-01-27 DIAGNOSIS — Z981 Arthrodesis status: Secondary | ICD-10-CM | POA: Diagnosis not present

## 2017-01-27 DIAGNOSIS — M5416 Radiculopathy, lumbar region: Secondary | ICD-10-CM

## 2017-01-27 DIAGNOSIS — M4802 Spinal stenosis, cervical region: Secondary | ICD-10-CM | POA: Diagnosis not present

## 2017-01-27 DIAGNOSIS — Z9889 Other specified postprocedural states: Secondary | ICD-10-CM | POA: Insufficient documentation

## 2017-01-27 DIAGNOSIS — M5412 Radiculopathy, cervical region: Secondary | ICD-10-CM | POA: Insufficient documentation

## 2017-01-27 DIAGNOSIS — M4316 Spondylolisthesis, lumbar region: Secondary | ICD-10-CM | POA: Insufficient documentation

## 2017-01-27 DIAGNOSIS — M48061 Spinal stenosis, lumbar region without neurogenic claudication: Secondary | ICD-10-CM | POA: Diagnosis not present

## 2017-01-27 MED ORDER — LIDOCAINE HCL (PF) 1 % IJ SOLN
5.0000 mL | Freq: Once | INTRAMUSCULAR | Status: AC
  Administered 2017-01-27: 5 mL
  Filled 2017-01-27: qty 5

## 2017-01-27 MED ORDER — IOPAMIDOL (ISOVUE-M 300) INJECTION 61%
15.0000 mL | Freq: Once | INTRAMUSCULAR | Status: AC | PRN
  Administered 2017-01-27: 10 mL via INTRATHECAL

## 2017-01-27 MED ORDER — IOPAMIDOL (ISOVUE-300) INJECTION 61%: 10.0000 mL | Freq: Once | INTRAVENOUS | Status: DC | PRN

## 2017-01-27 NOTE — Discharge Instructions (Signed)
Myelogram, Care After °Introduction °Refer to this sheet in the next few weeks. These instructions provide you with information about caring for yourself after your procedure. Your health care provider may also give you more specific instructions. Your treatment has been planned according to current medical practices, but problems sometimes occur. Call your health care provider if you have any problems or questions after your procedure. °What can I expect after the procedure? °After the procedure, it is common to have: °· Soreness at your injection site. °· A mild headache. °Follow these instructions at home: °· Drink enough fluid to keep your urine clear or pale yellow. This will help flush out the dye (contrast material) from your spine. °· Rest as told by your health care provider. Lie flat with your head slightly raised (elevated) to reduce the risk of headache. °· Do not bend, lift, or do any strenuous activity for 24-48 hours or as told by your health care provider. °· Take over-the-counter and prescription medicines only as told by your health care provider. °· Take care of and remove your bandage (dressing) as told by your health care provider. °· Bathe or shower as told by your health care provider. °Contact a health care provider if: °· You have a fever. °· You have a headache that lasts longer than 24 hours. °· You feel nauseous or vomit. °· You have a stiff neck or numbness in your legs. °· You are unable to urinate or have a bowel movement. °· You develop a rash, itching, or sneezing. °Get help right away if: °· You have new symptoms or your symptoms get worse. °· You have a seizure. °· You have trouble breathing. °This information is not intended to replace advice given to you by your health care provider. Make sure you discuss any questions you have with your health care provider. °Document Released: 12/25/2015 Document Revised: 05/05/2016 Document Reviewed: 09/10/2015 °© 2017 [employer]vier ° °

## 2017-01-27 NOTE — Progress Notes (Signed)
Pt. Stable after myelogram.VSS.Back stable.D/C instructions given.F/U with her M.D.

## 2017-02-22 DIAGNOSIS — Z79631 Long term (current) use of antimetabolite agent: Secondary | ICD-10-CM | POA: Insufficient documentation

## 2017-03-21 DIAGNOSIS — M4712 Other spondylosis with myelopathy, cervical region: Secondary | ICD-10-CM | POA: Insufficient documentation

## 2017-06-27 ENCOUNTER — Ambulatory Visit: Payer: Medicare Other | Attending: Family Medicine | Admitting: Physical Therapy

## 2017-06-27 DIAGNOSIS — M542 Cervicalgia: Secondary | ICD-10-CM | POA: Diagnosis present

## 2017-06-27 DIAGNOSIS — M25512 Pain in left shoulder: Secondary | ICD-10-CM | POA: Diagnosis present

## 2017-06-27 DIAGNOSIS — R262 Difficulty in walking, not elsewhere classified: Secondary | ICD-10-CM | POA: Diagnosis present

## 2017-06-27 DIAGNOSIS — G8929 Other chronic pain: Secondary | ICD-10-CM | POA: Diagnosis present

## 2017-06-27 NOTE — Patient Instructions (Addendum)
5x sit to stand - 28.11 seconds with valgus and use of UEs  64m Walk - 21.78 seconds with a cane   TUG - 25.36 seconds with cane   FGA 1. 1 2. 2 3. 0 4. 0  5. 1  6. 0 7. 0 8.  0 9. 1 10.  2 Total - 7/30  VOR x 1 - no nystagmus noted  VOR x 2 - no nystagmus noted   Feels more comfortable turning head to L while ambulating not as much with R (appears to be more mechanical vs having "vertigo" like symptoms).   TherEx  Standing hip abductions with yellow t-band  Supine bridging  Mini Squats  Side stepping with yellow t-band

## 2017-06-27 NOTE — Therapy (Signed)
[name] [facility] PHYSICAL AND SPORTS MEDICINE [address], [place], [address] Phone: [phone]   Fax:  [phone]  Physical Therapy Evaluation  Patient Details  Name: [name] MRN: [id number] Date of Birth: Jan 24, 1930 Referring Provider: Dr. Lorenso Courier  Encounter Date: 06/27/2017      PT End of Session - 06/27/17 1415    Visit Number 1   Number of Visits 13   Date for PT Re-Evaluation 08/22/17   PT Start Time 1302   PT Stop Time 1405   PT Time Calculation (min) 63 min   Activity Tolerance Patient tolerated treatment well   Behavior During Therapy [facility] for tasks assessed/performed      Past Medical History:  Diagnosis Date  . Hypertension     No past surgical history on file.  There were no vitals filed for this visit.       Subjective Assessment - 06/27/17 1311    Subjective Patient reports she underwent ACDF for C4-C6 on 04/19/2017 primarily for balance deficits and difficulty with walking. She reports she loses her balance if she looks R vs L while ambulating. She reports she was having some pain/weakness in her UEs for some time (has had injections). In her L arm she still has issues from her L shoulder to elbow. She has had a few falls reports she would pass out, she saw a cardiologist and he put in a pacemaker and has not had any more related to mechanical deficits. Her neck now only gets sore/tired as do her arms with prolonged sitting like watching TV and    Pertinent History She had a previous cervical surgery when she was 38, has 3 surgeries in her lumbar spine.    Limitations Walking;House hold activities   Patient Stated Goals To be able to walk her dog around the neighborhood and be able to garden.    Currently in Pain? Yes   Pain Score --  Gets pain for staying on her feet too long (more low back ache), neck and shoulders feel tired after prolonged sitting.    Pain Location Thoracic   Pain Orientation Posterior   Pain Descriptors / Indicators Sore   Pain Type Surgical pain;Chronic pain   Pain Onset More than a month ago   Pain Frequency Intermittent            [facility] PT Assessment - 06/27/17 1422      Assessment   Medical Diagnosis S/P ACDF   Referring Provider Dr. Lorenso Courier   Onset Date/Surgical Date 04/19/17   Hand Dominance Right     Precautions   Precautions Cervical   Precaution Comments No lifting > 10 pounds     Restrictions   Weight Bearing Restrictions No     Balance Screen   Has the patient fallen in the past 6 months Yes   How many times? 1     Home Environment   Living Environment Private residence   Living Arrangements Alone   Additional Comments Able to live on 1st floor, a few steps in     Prior Function   Level of Independence Requires assistive device for independence   Vocation Retired     IT consultant   Overall Cognitive Status Within Functional Limits for tasks assessed      5x sit to stand - 28.11 seconds with valgus and use of UEs  64m Walk - 21.78 seconds with a cane   TUG - 25.36 seconds with cane  FGA 1. 1 2. 2 3. 0 4. 0  5. 1  6. 0 7. 0 8.  0 9. 1 10.  2 Total - 7/30  VOR x 1 - no nystagmus noted  VOR x 2 - no nystagmus noted   Feels more comfortable turning head to L while ambulating not as much with R (appears to be more mechanical vs having "vertigo" like symptoms).   TherEx  Standing hip abductions with yellow t-band x 10 with cuing for more pure abduction so as not to use TFL Supine bridging x 8 repetitions (appropriately challenging) Mini Squats -- cuing for more posterior hip movement while completing, otherwise appropriate knee flexion to roughly 45 degress with HHA while completing  Side stepping with yellow t-band x 8 per side with yellow t-band, appropriate use of hip abductors.        Objective measurements completed on examination: See above findings.                  PT Education - 06/27/17 1415     Education provided Yes   Education Details Severe LE weakness indicated by all testing and likely contibuting to her decreased balance and difficulty ambulating.    Person(s) Educated Patient   Methods Explanation;Demonstration;Handout   Comprehension Verbalized understanding;Returned demonstration;Need further instruction             PT Long Term Goals - 06/27/17 1417      PT LONG TERM GOAL #1   Title Patient will be able to demonstrate sit to stand without use of UEs demonstrating improved functional LE strength.    Baseline Unable to stand without use of UEs.    Time 8   Period Weeks   Status New     PT LONG TERM GOAL #2   Title Patient will demonstrate FGA of at least 12/30 to demonstrate improved dynamic balance to demonstrate improved safety with community mobility.    Baseline 7/30   Time 8   Period Weeks   Status New     PT LONG TERM GOAL #3   Title Patient will demonstrate 5x sit to stand and TUG score of less than 17" to demonstrate improved safety with ambulation.    Time 8   Period Weeks   Status New     PT LONG TERM GOAL #4   Title Patient will be able to ambulate around her neighborhood with her dog to return to previous level of activity.    Baseline Unable to complete.    Time 8   Period Weeks   Status New                Plan - 06/27/17 1420    Clinical Impression Statement Patient is an 83 y/o female that presents roughly 2 months s/p ACDF for balance improvement and weakness. She has not seen much improvement to date, in this evaluation she is noted to have significant LE power and strengthe defiicts. She is unable to turn without taking quarter steps as she reports some of her balance deficits are when she turns her head, though this does not seem to be related to vestibular system. Balance is generally multi-factorial, however based on her presentation, improving her functional strength and speed are paramount . She really does not appear to be  having any pain or overt weakness post-op in her neck or UEs. Patient would benefit from skilled PT services to address her LE weakness and balance deficits to allow her to  ambulate with less risk of falling.    Clinical Presentation Stable   Clinical Decision Making Moderate   Rehab Potential Fair   PT Frequency 2x / week   PT Duration 6 weeks   PT Treatment/Interventions Aquatic Therapy;Moist Heat;Cryotherapy;Electrical Stimulation;Ultrasound;Therapeutic exercise;Therapeutic activities;Gait training;Stair training;Neuromuscular re-education;Manual techniques;Taping;Patient/family education   PT Next Visit Plan Progress dynamic functional balance and generalized strengthening.    PT Home Exercise Plan Supine bridging, mini squats, standing hip abductions with yellow t-bands, side stepping with yellow t-band   Consulted and Agree with Plan of Care Patient      Patient will benefit from skilled therapeutic intervention in order to improve the following deficits and impairments:  Abnormal gait, Decreased activity tolerance, Decreased balance, Decreased endurance, Decreased strength, Difficulty walking, Decreased knowledge of precautions, Decreased mobility, Pain  Visit Diagnosis: Difficulty in walking, not elsewhere classified - Plan: PT plan of care cert/re-cert  Neck pain - Plan: PT plan of care cert/re-cert  Chronic left shoulder pain - Plan: PT plan of care cert/re-cert      G-Codes - 07-03-17 1416    Functional Assessment Tool Used (Outpatient Only) 50m walk, FGA, TUG, 5x sit to stand    Functional Limitation Mobility: Walking and moving around   Mobility: Walking and Moving Around Current Status (G6440) At least 40 percent but less than 60 percent impaired, limited or restricted   Mobility: Walking and Moving Around Goal Status (H4742) At least 1 percent but less than 20 percent impaired, limited or restricted       Problem List There are no active problems to display for this  patient.  Alva Garnet PT, DPT, CSCS    03-Jul-2017, 5:19 PM  [place] [facility] PHYSICAL AND SPORTS MEDICINE [address] [place], [address] Phone: [phone]   Fax:  [phone]  Name: [name] MRN: [id number] Date of Birth: Aug 03, 1930

## 2017-07-04 ENCOUNTER — Ambulatory Visit: Payer: Medicare Other | Admitting: Physical Therapy

## 2017-07-04 DIAGNOSIS — R262 Difficulty in walking, not elsewhere classified: Secondary | ICD-10-CM

## 2017-07-04 DIAGNOSIS — M542 Cervicalgia: Secondary | ICD-10-CM

## 2017-07-04 DIAGNOSIS — M25512 Pain in left shoulder: Secondary | ICD-10-CM

## 2017-07-04 DIAGNOSIS — G8929 Other chronic pain: Secondary | ICD-10-CM

## 2017-07-04 NOTE — Therapy (Signed)
[place] [facility] PH[facility]ORTS MEDICINE [address] [place], [address] Phone: [phone]   Fax:  [phone]  Physical Therapy Treatment  Patient Details  Name: [name] MRN: [id number] Date of Birth: 1930-03-21 Referring Provider: Dr. Lorenso Courier  Encounter Date: 07/04/2017      PT End of Session - 07/04/17 1513    Visit Number 2   Number of Visits 13   Date for PT Re-Evaluation 08/22/17   PT Start Time 1353   PT Stop Time 1431   PT Time Calculation (min) 38 min   Activity Tolerance Patient tolerated treatment well   Behavior During Therapy [facility] for tasks assessed/performed      Past Medical History:  Diagnosis Date  . Hypertension     No past surgical history on file.  There were no vitals filed for this visit.      Subjective Assessment - 07/04/17 1418    Subjective Patient reports no new falls. She has been doing her HEP with no pain, but does report feeling wobbly/weakness in her LEs after completion.    Pertinent History She had a previous cervical surgery when she was 24, has 3 surgeries in her lumbar spine.    Limitations Walking;House hold activities   Patient Stated Goals To be able to walk her dog around the neighborhood and be able to garden.    Currently in Pain? No/denies      TRX sit to stands x 5 for 3 sets   Agility ladder laterally with bilateral HHA progressing to responding to verbal cuing with notable delays, but appropriate kinematics noted.   Standing hip abductions with yellow t-band x 10 for 2 sets   Single leg stance bowler squats x 12 with HHA on blue foam pad   Single leg stance with forward, lateral, and retro stepping on blue foam pad x 10 for 2 sets with random cuing from therapist   Gait with bilateral HHA with cuing for longer stride lengths which she was able to increase gait speed and stride length, likely a result of increased confidence in gait, though likely attributable to LE  weakness as well.   Lateral walking on blue foam pad with 1 finger HHA x 5 laps on TM.                             PT Education - 07/04/17 1512    Education provided Yes   Education Details Try HEP every other day to allow for more rest/recovery time.    Person(s) Educated Patient   Methods Explanation   Comprehension Verbalized understanding             PT Long Term Goals - 06/27/17 1417      PT LONG TERM GOAL #1   Title Patient will be able to demonstrate sit to stand without use of UEs demonstrating improved functional LE strength.    Baseline Unable to stand without use of UEs.    Time 8   Period Weeks   Status New     PT LONG TERM GOAL #2   Title Patient will demonstrate FGA of at least 12/30 to demonstrate improved dynamic balance to demonstrate improved safety with community mobility.    Baseline 7/30   Time 8   Period Weeks   Status New     PT LONG TERM GOAL #3   Title Patient will demonstrate 5x sit to  stand and TUG score of less than 17" to demonstrate improved safety with ambulation.    Time 8   Period Weeks   Status New     PT LONG TERM GOAL #4   Title Patient will be able to ambulate around her neighborhood with her dog to return to previous level of activity.    Baseline Unable to complete.    Time 8   Period Weeks   Status New               Plan - 07/04/17 1513    Clinical Impression Statement Patient demonstrates ability to take longer strides/increase gait speed with cuing and bilateral HHA. Her typical stride length is quite limited by LE weakness, which is being managed with HEP. She also has challenge with quick change of direction tasks, which will need to continue to be addressed.    Clinical Presentation Stable   Clinical Decision Making Moderate   Rehab Potential Fair   PT Frequency 2x / week   PT Duration 6 weeks   PT Treatment/Interventions Aquatic Therapy;Moist Heat;Cryotherapy;Electrical  Stimulation;Ultrasound;Therapeutic exercise;Therapeutic activities;Gait training;Stair training;Neuromuscular re-education;Manual techniques;Taping;Patient/family education   PT Next Visit Plan Progress dynamic functional balance and generalized strengthening.    PT Home Exercise Plan Supine bridging, mini squats, standing hip abductions with yellow t-bands, side stepping with yellow t-band   Consulted and Agree with Plan of Care Patient      Patient will benefit from skilled therapeutic intervention in order to improve the following deficits and impairments:  Abnormal gait, Decreased activity tolerance, Decreased balance, Decreased endurance, Decreased strength, Difficulty walking, Decreased knowledge of precautions, Decreased mobility, Pain  Visit Diagnosis: Difficulty in walking, not elsewhere classified  Neck pain  Chronic left shoulder pain     Problem List There are no active problems to display for this patient.  Alva Garnet PT, DPT, CSCS    07/04/2017, 3:15 PM  [place] [facility] PHYSICAL AND SPORTS MEDICINE [address], [place], [address] Phone: [phone]   Fax:  [phone]  Name: [name] MRN: [id number] Date of Birth: 04/08/1930

## 2017-07-04 NOTE — Patient Instructions (Signed)
TRX sit to stands x 5 for 3 sets   Agility ladder laterally with bilateral HHA   Standing hip abductions with yellow t-band x 10 for 2 sets   Single leg stance bowler squats x 12 with HHA on blue foam pad   Single leg stance with forward, lateral, and retro stepping on blue foam pad x 10 for 2 sets with random cuing from therapist   Gait with bilateral HHA with cuing for longer stride lengths   Lateral walking on blue foam pad with 1 finger HHA x 5 laps on TM.

## 2017-07-06 ENCOUNTER — Ambulatory Visit: Payer: Medicare Other | Admitting: Physical Therapy

## 2017-07-06 DIAGNOSIS — R262 Difficulty in walking, not elsewhere classified: Secondary | ICD-10-CM

## 2017-07-06 DIAGNOSIS — M25512 Pain in left shoulder: Secondary | ICD-10-CM

## 2017-07-06 DIAGNOSIS — M542 Cervicalgia: Secondary | ICD-10-CM

## 2017-07-06 DIAGNOSIS — G8929 Other chronic pain: Secondary | ICD-10-CM

## 2017-07-06 NOTE — Therapy (Signed)
[place] [facility] PH[facility]ORTS MEDICINE [address] [place], [address] Phone: [phone]   Fax:  [phone]  Physical Therapy Treatment  Patient Details  Name: [name] MRN: [id number] Date of Birth: Jun 27, 1930 Referring Provider: Dr. Lorenso Courier  Encounter Date: 07/06/2017      PT End of Session - 07/06/17 1250    Visit Number 3   Number of Visits 13   Date for PT Re-Evaluation 08/22/17   PT Start Time 1120   PT Stop Time 1200   PT Time Calculation (min) 40 min   Activity Tolerance Patient tolerated treatment well   Behavior During Therapy [facility] for tasks assessed/performed      Past Medical History:  Diagnosis Date  . Hypertension     No past surgical history on file.  There were no vitals filed for this visit.      Subjective Assessment - 07/06/17 1248    Subjective Patient reports she had some fatigue after previous session, no pain or soreness noted.    Pertinent History She had a previous cervical surgery when she was 82, has 3 surgeries in her lumbar spine.    Limitations Walking;House hold activities   Patient Stated Goals To be able to walk her dog around the neighborhood and be able to garden.    Currently in Pain? No/denies      Sit to stands from black mat table at difficult ranges for her (near the bottom or tracks) x 6 for 3 sets with yellow t-band around her knees to increase gluteal demand.   SLS on blue foam with minimal use of 1 finger for balance assistance x 6 repetitions for 2 sets x 5-10" holds   Monster walks without bands while holding on to PT hands, x 15' forward and retro.   Hip abductions on blue foam pad x 13 repetitions x 3 sets   Cervical ROM 48 degrees to the R, 58 degrees to the L   TUG 26.46 (did not follow all commands), on 2nd bout 18.64 seconds.   73m walk - 17.06 seconds   Educated and cued patient to round corners and turns with increased stride length and upright LEs  while leaning torso slightly, which she was able to do with SPC confidently and with increased gait speed.                            PT Education - 07/06/17 1249    Education provided Yes   Education Details Technique for rounding corners and turning.    Person(s) Educated Patient   Methods Explanation   Comprehension Verbalized understanding             PT Long Term Goals - 06/27/17 1417      PT LONG TERM GOAL #1   Title Patient will be able to demonstrate sit to stand without use of UEs demonstrating improved functional LE strength.    Baseline Unable to stand without use of UEs.    Time 8   Period Weeks   Status New     PT LONG TERM GOAL #2   Title Patient will demonstrate FGA of at least 12/30 to demonstrate improved dynamic balance to demonstrate improved safety with community mobility.    Baseline 7/30   Time 8   Period Weeks   Status New     PT LONG TERM GOAL #3   Title Patient will demonstrate  5x sit to stand and TUG score of less than 17" to demonstrate improved safety with ambulation.    Time 8   Period Weeks   Status New     PT LONG TERM GOAL #4   Title Patient will be able to ambulate around her neighborhood with her dog to return to previous level of activity.    Baseline Unable to complete.    Time 8   Period Weeks   Status New               Plan - 07/06/17 1250    Clinical Impression Statement Patient is demonstrating increasing gait speed, and is able to perform turns with increasing confidence with cuing for increased stride length and not shuffling. She still lacks Le strength still to ambulate independently, thus will require continued progressive resistance training.    Clinical Presentation Stable   Clinical Decision Making Moderate   Rehab Potential Fair   PT Frequency 2x / week   PT Duration 6 weeks   PT Treatment/Interventions Aquatic Therapy;Moist Heat;Cryotherapy;Electrical  Stimulation;Ultrasound;Therapeutic exercise;Therapeutic activities;Gait training;Stair training;Neuromuscular re-education;Manual techniques;Taping;Patient/family education   PT Next Visit Plan Progress dynamic functional balance and generalized strengthening.    PT Home Exercise Plan Supine bridging, mini squats, standing hip abductions with yellow t-bands, side stepping with yellow t-band   Consulted and Agree with Plan of Care Patient      Patient will benefit from skilled therapeutic intervention in order to improve the following deficits and impairments:  Abnormal gait, Decreased activity tolerance, Decreased balance, Decreased endurance, Decreased strength, Difficulty walking, Decreased knowledge of precautions, Decreased mobility, Pain  Visit Diagnosis: Difficulty in walking, not elsewhere classified  Neck pain  Chronic left shoulder pain     Problem List There are no active problems to display for this patient.  Alva Garnet PT, DPT, CSCS    07/06/2017, 12:54 PM  [facility] PHYSICAL AND SPORTS MEDICINE [address], [place], [address] Phone: [phone]   Fax:  [phone]  Name: [name] MRN: [id number] Date of Birth: 19-Jan-1930

## 2017-07-11 ENCOUNTER — Ambulatory Visit: Payer: Medicare Other | Admitting: Physical Therapy

## 2017-07-11 DIAGNOSIS — M25512 Pain in left shoulder: Secondary | ICD-10-CM

## 2017-07-11 DIAGNOSIS — R262 Difficulty in walking, not elsewhere classified: Secondary | ICD-10-CM | POA: Diagnosis not present

## 2017-07-11 DIAGNOSIS — M542 Cervicalgia: Secondary | ICD-10-CM

## 2017-07-11 DIAGNOSIS — G8929 Other chronic pain: Secondary | ICD-10-CM

## 2017-07-11 NOTE — Therapy (Addendum)
[place] [facility] PHYSICAL [facility]ORTS MEDICINE [address] [place], [address] Phone: [phone]   Fax:  [phone]  Physical Therapy Treatment  Patient Details  Name: [name] MRN: [id number] Date of Birth: [birth year] Referring Provider: Dr. Lorenso Courier  Encounter Date: 07/11/2017      PT End of Session - 07/11/17 1635    Visit Number 4   Number of Visits 13   Date for PT Re-Evaluation 08/22/17   PT Start Time 1430   PT Stop Time 1515   PT Time Calculation (min) 45 min   Activity Tolerance Patient tolerated treatment well   Behavior During Therapy [facility] for tasks assessed/performed      Past Medical History:  Diagnosis Date  . Hypertension     No past surgical history on file.  There were no vitals filed for this visit.      Subjective Assessment - 07/11/17 1634    Subjective Patient reports she thinks she is a bit stiffer than normal today due to the weather, reports she has noticed some improvement in the strength she is experiencing in her LEs.    Pertinent History She had a previous cervical surgery when she was 50, has 3 surgeries in her lumbar spine.    Limitations Walking;House hold activities   Patient Stated Goals To be able to walk her dog around the neighborhood and be able to garden.    Currently in Pain? No/denies     FGA  1. - 2 2. - 2 3. -2 4. -2 5. -3 6.- 1 7. - 0 8. -0 9. -2 10. -2  TUG - 16.49 seconds with SPC  14m walk with SPC - 13.02 seconds   Eyes closed SLS on firm surface with minimal assistance from 1-2 fingers x 5 bouts x 5-10" holds per side   Agility ladder forward/laterally/diagonally with HHA with cuing for improving gait speed -- notable for improving gait speed and trunk control relative to previous sessions, though she continues to be quite hesitant with step distance.  Tandem gait at TM x 3 laps with HHA (cued to complete at home as narrow BOS continues to be quite difficult for  her.  Late addition exercise selection  Alva Garnet PT, DPT, CSCS                             PT Education - 07/11/17 1635    Education provided Yes   Education Details Improvement in balance outcome measures thus far.    Person(s) Educated Patient   Methods Explanation   Comprehension Verbalized understanding             PT Long Term Goals - 06/27/17 1417      PT LONG TERM GOAL #1   Title Patient will be able to demonstrate sit to stand without use of UEs demonstrating improved functional LE strength.    Baseline Unable to stand without use of UEs.    Time 8   Period Weeks   Status New     PT LONG TERM GOAL #2   Title Patient will demonstrate FGA of at least 12/30 to demonstrate improved dynamic balance to demonstrate improved safety with community mobility.    Baseline 7/30   Time 8   Period Weeks   Status New     PT LONG TERM GOAL #3   Title Patient will demonstrate 5x sit to stand and TUG  score of less than 17" to demonstrate improved safety with ambulation.    Time 8   Period Weeks   Status New     PT LONG TERM GOAL #4   Title Patient will be able to ambulate around her neighborhood with her dog to return to previous level of activity.    Baseline Unable to complete.    Time 8   Period Weeks   Status New               Plan - 07/11/17 1635    Clinical Impression Statement Patient continues to demonstrate improvement in 28m walk time and TUG. Her FGA is indicative of deficits with tandem stance/balance as well as heavy reliance on use of eyes to complete balance tasks.    Clinical Presentation Stable   Clinical Decision Making Moderate   Rehab Potential Fair   PT Frequency 2x / week   PT Duration 6 weeks   PT Treatment/Interventions Aquatic Therapy;Moist Heat;Cryotherapy;Electrical Stimulation;Ultrasound;Therapeutic exercise;Therapeutic activities;Gait training;Stair training;Neuromuscular re-education;Manual  techniques;Taping;Patient/family education   PT Next Visit Plan Progress dynamic functional balance and generalized strengthening.    PT Home Exercise Plan Supine bridging, mini squats, standing hip abductions with yellow t-bands, side stepping with yellow t-band   Consulted and Agree with Plan of Care Patient      Patient will benefit from skilled therapeutic intervention in order to improve the following deficits and impairments:  Abnormal gait, Decreased activity tolerance, Decreased balance, Decreased endurance, Decreased strength, Difficulty walking, Decreased knowledge of precautions, Decreased mobility, Pain  Visit Diagnosis: Difficulty in walking, not elsewhere classified  Neck pain  Chronic left shoulder pain     Problem List There are no active problems to display for this patient.  Alva Garnet PT, DPT, CSCS    07/11/2017, 4:37 PM  [place] [facility] PHYSICAL AND SPORTS MEDICINE [address] [place], [address] Phone: [phone]   Fax:  [phone]  Name: [name] MRN: [id number] Date of Birth: 1930/11/11

## 2017-07-13 ENCOUNTER — Ambulatory Visit: Payer: Medicare Other | Attending: Family Medicine | Admitting: Physical Therapy

## 2017-07-13 DIAGNOSIS — R262 Difficulty in walking, not elsewhere classified: Secondary | ICD-10-CM | POA: Insufficient documentation

## 2017-07-13 DIAGNOSIS — M542 Cervicalgia: Secondary | ICD-10-CM | POA: Insufficient documentation

## 2017-07-13 DIAGNOSIS — M25512 Pain in left shoulder: Secondary | ICD-10-CM | POA: Insufficient documentation

## 2017-07-13 DIAGNOSIS — G8929 Other chronic pain: Secondary | ICD-10-CM | POA: Diagnosis present

## 2017-07-13 NOTE — Therapy (Signed)
[place] [facility] PHYSICAL AND SPORTS MEDICINE [address], [place], [address] Phone: [phone]   Fax:  [phone]  Physical Therapy Treatment  Patient Details  Name: [name] MRN: [id number] Date of Birth: 03/30/1930 Referring Provider: Dr. Lorenso Courier  Encounter Date: 07/13/2017      PT End of Session - 07/13/17 1537    Visit Number 5   Number of Visits 13   Date for PT Re-Evaluation 08/22/17   PT Start Time 1517   PT Stop Time 1600   PT Time Calculation (min) 43 min   Activity Tolerance Patient tolerated treatment well   Behavior During Therapy [facility] for tasks assessed/performed      Past Medical History:  Diagnosis Date  . Hypertension     No past surgical history on file.  There were no vitals filed for this visit.      Subjective Assessment - 07/13/17 1519    Subjective Patient reports she has continued to be quite active. She has been fairly tired today.    Pertinent History She had a previous cervical surgery when she was 89, has 3 surgeries in her lumbar spine.    Limitations Walking;House hold activities   Patient Stated Goals To be able to walk her dog around the neighborhood and be able to garden.    Currently in Pain? No/denies      Standing hip abductions on MATRIX 10# x 12 for 3 sets   Tandem stance (and eyes closed with use of HHA on railing when needed for lateral sway) -- patient demonstrated significant decrease in performance and increase in lateral sway with eyes closed condition indicating strong reliance on visual input for balance.   Sit to stands with 2# from mat table with tactile cuing x 10 repetitions (cuing not to collapse into valgus) -- verbal cuing to anteriorly translate weight to improve quad leverage   Balance course with blue foam and half ball with min-mod use of UEs for assistance. (for perturbation and reactionary training)                             PT  Education - 07/13/17 1653    Education provided Yes   Education Details Need to continue to work on balance strategies outside of vision (proprioceptive, sensory) via eyes closed training. Will work on [employer] as well.    Person(s) Educated Patient   Methods Explanation   Comprehension Verbalized understanding             PT Long Term Goals - 06/27/17 1417      PT LONG TERM GOAL #1   Title Patient will be able to demonstrate sit to stand without use of UEs demonstrating improved functional LE strength.    Baseline Unable to stand without use of UEs.    Time 8   Period Weeks   Status New     PT LONG TERM GOAL #2   Title Patient will demonstrate FGA of at least 12/30 to demonstrate improved dynamic balance to demonstrate improved safety with community mobility.    Baseline 7/30   Time 8   Period Weeks   Status New     PT LONG TERM GOAL #3   Title Patient will demonstrate 5x sit to stand and TUG score of less than 17" to demonstrate improved safety with ambulation.    Time 8   Period Weeks   Status New  PT LONG TERM GOAL #4   Title Patient will be able to ambulate around her neighborhood with her dog to return to previous level of activity.    Baseline Unable to complete.    Time 8   Period Weeks   Status New               Plan - 07/13/17 1545    Clinical Impression Statement Patient continues to tolerate increase in demand with resistance training, notable for difficulty but improving balance with eyes closed on firm and soft surfaces. She continues to show hesitation and limited step length with gait initially.    Clinical Presentation Stable   Clinical Decision Making Moderate   Rehab Potential Fair   PT Frequency 2x / week   PT Duration 6 weeks   PT Treatment/Interventions Aquatic Therapy;Moist Heat;Cryotherapy;Electrical Stimulation;Ultrasound;Therapeutic exercise;Therapeutic activities;Gait training;Stair training;Neuromuscular  re-education;Manual techniques;Taping;Patient/family education   PT Next Visit Plan Progress dynamic functional balance and generalized strengthening.    PT Home Exercise Plan Supine bridging, mini squats, standing hip abductions with yellow t-bands, side stepping with yellow t-band   Consulted and Agree with Plan of Care Patient      Patient will benefit from skilled therapeutic intervention in order to improve the following deficits and impairments:  Abnormal gait, Decreased activity tolerance, Decreased balance, Decreased endurance, Decreased strength, Difficulty walking, Decreased knowledge of precautions, Decreased mobility, Pain  Visit Diagnosis: Difficulty in walking, not elsewhere classified  Neck pain  Chronic left shoulder pain     Problem List There are no active problems to display for this patient.  Alva Garnet PT, DPT, CSCS     07/13/2017, 4:55 PM  [place] [facility] PHYSICAL AND SPORTS MEDICINE [address] [place], [address] Phone: [phone]   Fax:  [phone]  Name: PERCY COMP MRN: [id number] Date of Birth: 1930/09/07

## 2017-07-13 NOTE — Patient Instructions (Addendum)
Standing hip abductions on MATRIX 10# x 12 for 3 sets   Tandem stance (and eyes closed with use of HHA on railing when needed for lateral sway)   Sit to stands with 2# from mat table with tactile cuing

## 2017-07-17 ENCOUNTER — Ambulatory Visit: Payer: Medicare Other | Admitting: Physical Therapy

## 2017-07-17 DIAGNOSIS — G8929 Other chronic pain: Secondary | ICD-10-CM

## 2017-07-17 DIAGNOSIS — R262 Difficulty in walking, not elsewhere classified: Secondary | ICD-10-CM

## 2017-07-17 DIAGNOSIS — M25512 Pain in left shoulder: Secondary | ICD-10-CM

## 2017-07-17 DIAGNOSIS — M542 Cervicalgia: Secondary | ICD-10-CM

## 2017-07-17 NOTE — Patient Instructions (Addendum)
BP - 120/57 HR-72 bpm   Standing hip abductions 3 x 12 @ 25#  Cervical flexion/overpressure   Cervical rotation with gait   Agility ladder (forward/retro) going R and L

## 2017-07-18 NOTE — Therapy (Signed)
[place] [facility] [address], [place], [address] Phone: [phone]   Fax:  [phone]  Physical Therapy Treatment  Patient Details  Name: [name] MRN: [id number] Date of Birth: 1930/07/15 Referring Provider: Dr. Lorenso Courier  Encounter Date: 07/17/2017      PT End of Session - 07/17/17 1320    Visit Number 6   Number of Visits 13   Date for PT Re-Evaluation 08/22/17   PT Start Time 1305   PT Stop Time 1345   PT Time Calculation (min) 40 min   Activity Tolerance Patient tolerated treatment well   Behavior During Therapy [facility] for tasks assessed/performed      Past Medical History:  Diagnosis Date  . Hypertension     No past surgical history on file.  There were no vitals filed for this visit.      Subjective Assessment - 07/17/17 1310    Subjective Patient reports she has felt quite weak and wobbly today in her LEs. She reports she did "well" over the weekend. Her 3 month follow up is tomorrow. She is anxious about her husband potentially returning home from ALF as she does not feel physically ready to manage him yet.    Pertinent History She had a previous cervical surgery when she was 67, has 3 surgeries in her lumbar spine.    Limitations Walking;House hold activities   Patient Stated Goals To be able to walk her dog around the neighborhood and be able to garden.    Currently in Pain? No/denies      BP - 120/57 HR-72 bpm   Standing hip abductions 3 x 12 @ 25# (too difficult) thus all sets performed at 10# with bilateral HHA  Cervical rotation/overpressure x 8 bilaterally with 2 finger overpressure by treating therapist, discussed with patient to add this to her HEP as her ROM continues to be quite limited R>L in terms of limitations   Cervical rotation with gait -- patient continues to require decrease in speed, increased reliance on SPC while looking laterally.  Agility ladder (forward/retro)  going R and L with cuing from therapist for unexpected change of direction -- patient demonstrates intermittent steps where she does not adequately flex her thigh to step over barrier, but on the whole this is much improved from previous visits, she is able to take longer strides and react more quickly to verbal stimuli to change directions.   L drill (cones set up in a L pattern roughly 8' apart -- observed initially to require coming to near full stop with quarter turns to re-direct her gait pattern, cued patient to round corners to complete more quickly, scan towards target by rotating at cervical spine which she was able to complete with improved gait speed and improved mechanics into turns.                             PT Education - 07/18/17 1118    Education provided Yes   Education Details Educated patient need to begin to address ROM deficits in her cervical spine to allow for improved balance with turning/looking to the side while ambulating.    Person(s) Educated Patient   Methods Explanation   Comprehension Verbalized understanding             PT Long Term Goals - 06/27/17 1417      PT LONG TERM GOAL #1   Title  Patient will be able to demonstrate sit to stand without use of UEs demonstrating improved functional LE strength.    Baseline Unable to stand without use of UEs.    Time 8   Period Weeks   Status New     PT LONG TERM GOAL #2   Title Patient will demonstrate FGA of at least 12/30 to demonstrate improved dynamic balance to demonstrate improved safety with community mobility.    Baseline 7/30   Time 8   Period Weeks   Status New     PT LONG TERM GOAL #3   Title Patient will demonstrate 5x sit to stand and TUG score of less than 17" to demonstrate improved safety with ambulation.    Time 8   Period Weeks   Status New     PT LONG TERM GOAL #4   Title Patient will be able to ambulate around her neighborhood with her dog to return to  previous level of activity.    Baseline Unable to complete.    Time 8   Period Weeks   Status New               Plan - 07/18/17 1118    Clinical Impression Statement Patient noted to have moderate to severe loss of cervical rotation ROM, given her significant dependence on visual input for balance, it appears this is likely also contributing to her balance deficits. She was able to respond to cues to round off turns to not have to stop and perform quarter turns (compensation for not having the cervical ROM to see targets laterally). She does report her LEs are feeling stronger, and her gait speed has certainly improved. She will continue to benefit from work on increasing cervical ROM and training to decrease reliance on visual inputs for balance.    Clinical Presentation Stable   Clinical Decision Making Moderate   Rehab Potential Fair   PT Frequency 2x / week   PT Duration 6 weeks   PT Treatment/Interventions Aquatic Therapy;Moist Heat;Cryotherapy;Electrical Stimulation;Ultrasound;Therapeutic exercise;Therapeutic activities;Gait training;Stair training;Neuromuscular re-education;Manual techniques;Taping;Patient/family education   PT Next Visit Plan Progress dynamic functional balance and generalized strengthening.    PT Home Exercise Plan Supine bridging, mini squats, standing hip abductions with yellow t-bands, side stepping with yellow t-band   Consulted and Agree with Plan of Care Patient      Patient will benefit from skilled therapeutic intervention in order to improve the following deficits and impairments:  Abnormal gait, Decreased activity tolerance, Decreased balance, Decreased endurance, Decreased strength, Difficulty walking, Decreased knowledge of precautions, Decreased mobility, Pain  Visit Diagnosis: Difficulty in walking, not elsewhere classified  Neck pain  Chronic left shoulder pain     Problem List There are no active problems to display for this  patient.  Alva Garnet PT, DPT, CSCS    07/18/2017, 11:24 AM  [place] [facility] PHYSICAL AND SPORTS MEDICINE [address], [place], [address] Phone: [phone]   Fax:  [phone]  Name: [name] MRN: [id number] Date of Birth: Jul 13, 1930

## 2017-07-20 ENCOUNTER — Ambulatory Visit: Payer: Medicare Other | Admitting: Physical Therapy

## 2017-07-20 DIAGNOSIS — M542 Cervicalgia: Secondary | ICD-10-CM

## 2017-07-20 DIAGNOSIS — G8929 Other chronic pain: Secondary | ICD-10-CM

## 2017-07-20 DIAGNOSIS — R262 Difficulty in walking, not elsewhere classified: Secondary | ICD-10-CM

## 2017-07-20 DIAGNOSIS — M25512 Pain in left shoulder: Secondary | ICD-10-CM

## 2017-07-20 NOTE — Therapy (Signed)
[place] [facility] PH[facility]TS MEDICINE [address], [place], [address] Phone: [phone]   Fax:  [phone]  Physical Therapy Treatment  Patient Details  Name: [name] MRN: [id number] Date of Birth: [birth year] Referring Provider: Dr. Lorenso Courier  Encounter Date: 07/20/2017      PT End of Session - 07/20/17 1734    Visit Number 7   Number of Visits 13   Date for PT Re-Evaluation 08/22/17   PT Start Time 1420   PT Stop Time 1500   PT Time Calculation (min) 40 min   Activity Tolerance Patient tolerated treatment well   Behavior During Therapy [facility] for tasks assessed/performed      Past Medical History:  Diagnosis Date  . Hypertension     No past surgical history on file.  There were no vitals filed for this visit.      Subjective Assessment - 07/20/17 1730    Subjective Patient reports she still does not feel great today. She chose not to go to her follow up yesterday as she is not satisfied with her progress thus far. She does feel that PT has been beneficial thus far and has been doing her HEP.    Pertinent History She had a previous cervical surgery when she was 30, has 3 surgeries in her lumbar spine.    Limitations Walking;House hold activities   Patient Stated Goals To be able to walk her dog around the neighborhood and be able to garden.    Currently in Pain? No/denies      Turning with SPC and cuing to use her cervical rotation ROM to scan turns x 6 repetitions per side with canes and objects used as reference point in main gym area   Standing hip abductions with red t-band x 12 for 2 sets with single HHA   Side stepping with red t-band at TM x 12 for 2 sets with bilateral HHA   Sit to stands with pillow doubled over with no hands (inconsistent loss of balance posteriorly) x 6 for 3 sets (continues to show significant valgus collapse and use of adductors to help with sit to stand from chair.   Tandem stance  with eyes closed x 10" holds x 3 bilaterally with min A from L hand.                              PT Education - 07/20/17 1731    Education provided Yes   Education Details Educated patient that improving balance is both difficult and can be a lengthy process. Need to improve strength, proprioception, and postural reactions.    Person(s) Educated Patient   Methods Explanation;Demonstration;Handout   Comprehension Verbalized understanding;Returned demonstration             PT Long Term Goals - 06/27/17 1417      PT LONG TERM GOAL #1   Title Patient will be able to demonstrate sit to stand without use of UEs demonstrating improved functional LE strength.    Baseline Unable to stand without use of UEs.    Time 8   Period Weeks   Status New     PT LONG TERM GOAL #2   Title Patient will demonstrate FGA of at least 12/30 to demonstrate improved dynamic balance to demonstrate improved safety with community mobility.    Baseline 7/30   Time 8   Period Weeks   Status New  PT LONG TERM GOAL #3   Title Patient will demonstrate 5x sit to stand and TUG score of less than 17" to demonstrate improved safety with ambulation.    Time 8   Period Weeks   Status New     PT LONG TERM GOAL #4   Title Patient will be able to ambulate around her neighborhood with her dog to return to previous level of activity.    Baseline Unable to complete.    Time 8   Period Weeks   Status New               Plan - 07/20/17 1735    Clinical Impression Statement Patient educated that improving balance especially with her strength loss and other deficits will take a long time and multiple bouts of completing her HEP. Her strength is improving slowly in functional tasks (can perform sit to stands with a pillow underneath her without hands now).    Clinical Presentation Stable   Clinical Decision Making Moderate   Rehab Potential Fair   PT Frequency 2x / week   PT  Duration 6 weeks   PT Treatment/Interventions Aquatic Therapy;Moist Heat;Cryotherapy;Electrical Stimulation;Ultrasound;Therapeutic exercise;Therapeutic activities;Gait training;Stair training;Neuromuscular re-education;Manual techniques;Taping;Patient/family education   PT Next Visit Plan Progress dynamic functional balance and generalized strengthening.    PT Home Exercise Plan Supine bridging, mini squats, standing hip abductions with yellow t-bands, side stepping with yellow t-band   Consulted and Agree with Plan of Care Patient      Patient will benefit from skilled therapeutic intervention in order to improve the following deficits and impairments:  Abnormal gait, Decreased activity tolerance, Decreased balance, Decreased endurance, Decreased strength, Difficulty walking, Decreased knowledge of precautions, Decreased mobility, Pain  Visit Diagnosis: Difficulty in walking, not elsewhere classified  Neck pain  Chronic left shoulder pain     Problem List There are no active problems to display for this patient.  Alva Garnet PT, DPT, CSCS    07/20/2017, 5:39 PM  [place] [facility] PHYS[facility] AND SPORTS MEDICINE [address] [place], [address] Phone: [phone]   Fax:  [phone]  Name: [name] MRN: [id number] Date of Birth: [birth year]

## 2017-07-25 ENCOUNTER — Ambulatory Visit: Payer: Medicare Other | Admitting: Physical Therapy

## 2017-07-27 ENCOUNTER — Encounter: Payer: Medicare Other | Admitting: Physical Therapy

## 2017-07-27 ENCOUNTER — Encounter: Payer: Self-pay | Admitting: Physical Therapy

## 2017-07-27 ENCOUNTER — Ambulatory Visit: Payer: Medicare Other | Admitting: Physical Therapy

## 2017-07-27 DIAGNOSIS — R262 Difficulty in walking, not elsewhere classified: Secondary | ICD-10-CM

## 2017-07-27 DIAGNOSIS — G8929 Other chronic pain: Secondary | ICD-10-CM

## 2017-07-27 DIAGNOSIS — M25512 Pain in left shoulder: Secondary | ICD-10-CM

## 2017-07-27 DIAGNOSIS — M542 Cervicalgia: Secondary | ICD-10-CM

## 2017-07-27 NOTE — Therapy (Signed)
[place] [facility] PHYSICAL [facility] SPORTS MEDICINE [address], [place], [address] Phone: [phone]   Fax:  [phone]  Physical Therapy Treatment  Patient Details  Name: [name] MRN: [id number] Date of Birth: 10/29/1930 Referring Provider: Dr. Lorenso Courier  Encounter Date: 07/27/2017      PT End of Session - 07/27/17 1010    Visit Number 8   Number of Visits 13   Date for PT Re-Evaluation 08/22/17   PT Start Time 1010   PT Stop Time 1056   PT Time Calculation (min) 46 min   Activity Tolerance Patient tolerated treatment well   Behavior During Therapy [facility] for tasks assessed/performed      Past Medical History:  Diagnosis Date  . Hypertension     History reviewed. No pertinent surgical history.  There were no vitals filed for this visit.      Subjective Assessment - 07/27/17 1012    Subjective Pt saw her PCP who suggested for pt to have another EMG done in the next few weeks to re-check for neuropathy.  Pt reports her balance is worse in the morning.  Pt had difficulty getting RTB around her feet so she has been completing these exercises as part of her HEP without the band.  No complaints or concerns.     Pertinent History She had a previous cervical surgery when she was 51, has 3 surgeries in her lumbar spine.    Limitations Walking;House hold activities   Patient Stated Goals To be able to walk her dog around the neighborhood and be able to garden.    Currently in Pain? No/denies   Multiple Pain Sites No       TREATMENT   Ambulating in gym with [facility] and practicing turns and vertical and horizontal head turns x400 ft. Most difficulty with vertical head turns, pt decreases gait speed with this.   Practiced set up of RTB in sitting and positioning of chair directly in front of countertop (TM during session) which pt was able to perform independently.   Standing hip abductions with red t-band x 10 for 2 sets with single HHA    Side stepping with red t-band at TM x 10 for 2 sets with bilateral HHA. Cues for slight knee bend for greater glute recruitment.   Sit to stands without UE support with cues to power up to standing x10. Repeated with mirror feedback and cues to avoid hip adduction x10 and again without mirror with proper form x10.   Tandem stance with intermittent 1UE supported on railing 2x30 seconds each LE.   Tandem walking on airex pad x10 lengths   Marching on airex pad x20 each LE with 1UE supported.   Step ups to airex pad x20 each LE with 1UE supported.             PT Education - 07/27/17 1009    Education provided Yes   Education Details Exercise technique   Person(s) Educated Patient   Methods Explanation;Demonstration   Comprehension Verbalized understanding;Returned demonstration;Need further instruction             PT Long Term Goals - 06/27/17 1417      PT LONG TERM GOAL #1   Title Patient will be able to demonstrate sit to stand without use of UEs demonstrating improved functional LE strength.    Baseline Unable to stand without use of UEs.    Time 8   Period Weeks   Status New  PT LONG TERM GOAL #2   Title Patient will demonstrate FGA of at least 12/30 to demonstrate improved dynamic balance to demonstrate improved safety with community mobility.    Baseline 7/30   Time 8   Period Weeks   Status New     PT LONG TERM GOAL #3   Title Patient will demonstrate 5x sit to stand and TUG score of less than 17" to demonstrate improved safety with ambulation.    Time 8   Period Weeks   Status New     PT LONG TERM GOAL #4   Title Patient will be able to ambulate around her neighborhood with her dog to return to previous level of activity.    Baseline Unable to complete.    Time 8   Period Weeks   Status New               Plan - 07/27/17 1101    Clinical Impression Statement Practiced set up using RTB to complete HEP which pt performed independently.   She demonstrates improved technique and form with sit<>stand exercise with cues and mirror feedback for decreased Bil hip adduction.  She demonstrates instability with balance activities and has a difficult time decreasing amount of UE support due to fear of falling.  She demonstrates instability with head turns, especially vertical head turns.  She will benefit from continued skilled PT for improved balance, strength, and decreased risk of falling.     Rehab Potential Fair   PT Frequency 2x / week   PT Duration 6 weeks   PT Treatment/Interventions Aquatic Therapy;Moist Heat;Cryotherapy;Electrical Stimulation;Ultrasound;Therapeutic exercise;Therapeutic activities;Gait training;Stair training;Neuromuscular re-education;Manual techniques;Taping;Patient/family education   PT Next Visit Plan Progress dynamic functional balance and generalized strengthening.    PT Home Exercise Plan Supine bridging, mini squats, standing hip abductions with yellow t-bands, side stepping with yellow t-band   Consulted and Agree with Plan of Care Patient      Patient will benefit from skilled therapeutic intervention in order to improve the following deficits and impairments:  Abnormal gait, Decreased activity tolerance, Decreased balance, Decreased endurance, Decreased strength, Difficulty walking, Decreased knowledge of precautions, Decreased mobility, Pain  Visit Diagnosis: Difficulty in walking, not elsewhere classified  Neck pain  Chronic left shoulder pain     Problem List There are no active problems to display for this patient.   Encarnacion Chu PT, DPT 07/27/2017, 11:04 AM  [place] [facility] PHYSICAL AND SPORTS MEDICINE [address] [place], [address] Phone: [phone]   Fax:  [phone]  Name: [name] MRN: [id number] Date of Birth: 04-01-1930

## 2017-08-01 ENCOUNTER — Ambulatory Visit: Payer: Medicare Other | Admitting: Physical Therapy

## 2017-08-01 ENCOUNTER — Encounter: Payer: Self-pay | Admitting: Physical Therapy

## 2017-08-01 DIAGNOSIS — M25512 Pain in left shoulder: Secondary | ICD-10-CM

## 2017-08-01 DIAGNOSIS — G8929 Other chronic pain: Secondary | ICD-10-CM

## 2017-08-01 DIAGNOSIS — M542 Cervicalgia: Secondary | ICD-10-CM

## 2017-08-01 DIAGNOSIS — R262 Difficulty in walking, not elsewhere classified: Secondary | ICD-10-CM | POA: Diagnosis not present

## 2017-08-01 NOTE — Therapy (Signed)
[place] [facility] PHYS[facility] SPORTS MEDICINE [address] [place], [address] Phone: [phone]   Fax:  [phone]  Physical Therapy Treatment  Patient Details  Name: [name] MRN: [id number] Date of Birth: [birth year] Referring Provider: Dr. Lorenso Courier  Encounter Date: 08/01/2017      PT End of Session - 08/01/17 1329    Visit Number 9   Number of Visits 13   Date for PT Re-Evaluation 08/22/17   PT Start Time 1312   PT Stop Time 1344   PT Time Calculation (min) 32 min   Activity Tolerance Patient tolerated treatment well   Behavior During Therapy [facility] for tasks assessed/performed      Past Medical History:  Diagnosis Date  . Hypertension     History reviewed. No pertinent surgical history.  There were no vitals filed for this visit.      Subjective Assessment - 08/01/17 1313    Subjective Pt arrived 12 minutes late to her appointment.  She reports her husband came home from ALF this past weekend and is requring some assist from her which has been challenging with her patientce, she denies needing to assist him physically.  She has been completing her HEP and has questions about ensuring she is not overdoing her exercises.     Pertinent History She had a previous cervical surgery when she was 89, has 3 surgeries in her lumbar spine.    Limitations Walking;House hold activities   Patient Stated Goals To be able to walk her dog around the neighborhood and be able to garden.    Currently in Pain? No/denies        TREATMENT  AAROM cervical rotation Bil x10 in sitting. Cues to only push into mild to moderate stretch and not past this.  Ambulating in gym with [facility] and practicing turns and vertical and horizontal head turns x400 ft.  Lateral lunges on airex pad x15 each direction  Sit to stands with intermittent UE support and cues to avoid Bil hip adduction. x10  Tandem walking on airex pad x10 lengths  Lateral side stepping over  long airex pad x2 minutes  Ambulating in gym with randomized cues for ant/post/L/R x3 minutes  Marching on airex pad x20 each LE with 1UE supported.  Step ups to airex pad x20 each LE with 1UE supported.                            PT Education - 08/01/17 1328    Education provided Yes   Education Details Exercise technique   Person(s) Educated Patient   Methods Explanation;Demonstration   Comprehension Verbalized understanding;Returned demonstration;Need further instruction;Verbal cues required             PT Long Term Goals - 06/27/17 1417      PT LONG TERM GOAL #1   Title Patient will be able to demonstrate sit to stand without use of UEs demonstrating improved functional LE strength.    Baseline Unable to stand without use of UEs.    Time 8   Period Weeks   Status New     PT LONG TERM GOAL #2   Title Patient will demonstrate FGA of at least 12/30 to demonstrate improved dynamic balance to demonstrate improved safety with community mobility.    Baseline 7/30   Time 8   Period Weeks   Status New     PT LONG TERM GOAL #3  Title Patient will demonstrate 5x sit to stand and TUG score of less than 17" to demonstrate improved safety with ambulation.    Time 8   Period Weeks   Status New     PT LONG TERM GOAL #4   Title Patient will be able to ambulate around her neighborhood with her dog to return to previous level of activity.    Baseline Unable to complete.    Time 8   Period Weeks   Status New               Plan - 08/01/17 1330    Clinical Impression Statement Pt continues to be self limiting with strengthening and balance exercises often saying, "but it's easier if I do it this way".  Education provided on reasoning behind challenging herself with exercises which she gradually improved with throughout session.  She demonstrates difficulty with stepping up and over objects and from spontaneous direction changes when ambulating.  She  will benefit from continued skilled PT interventions for improved strength, balance, and to decrease risk of falling.   Rehab Potential Fair   PT Frequency 2x / week   PT Duration 6 weeks   PT Treatment/Interventions Aquatic Therapy;Moist Heat;Cryotherapy;Electrical Stimulation;Ultrasound;Therapeutic exercise;Therapeutic activities;Gait training;Stair training;Neuromuscular re-education;Manual techniques;Taping;Patient/family education   PT Next Visit Plan Progress dynamic functional balance and generalized strengthening.    PT Home Exercise Plan Supine bridging, mini squats, standing hip abductions with yellow t-bands, side stepping with yellow t-band   Consulted and Agree with Plan of Care Patient      Patient will benefit from skilled therapeutic intervention in order to improve the following deficits and impairments:  Abnormal gait, Decreased activity tolerance, Decreased balance, Decreased endurance, Decreased strength, Difficulty walking, Decreased knowledge of precautions, Decreased mobility, Pain  Visit Diagnosis: Difficulty in walking, not elsewhere classified  Neck pain  Chronic left shoulder pain     Problem List There are no active problems to display for this patient.   Encarnacion Chu PT, DPT 08/01/2017, 1:46 PM  [place] [facility] PHYSICAL AND SPORTS MEDICINE [address], [place], [address] Phone: [phone]   Fax:  [phone]  Name: [name] MRN: [id number] Date of Birth: 07/26/1930

## 2017-08-08 ENCOUNTER — Ambulatory Visit: Payer: Medicare Other

## 2017-08-08 DIAGNOSIS — R262 Difficulty in walking, not elsewhere classified: Secondary | ICD-10-CM | POA: Diagnosis not present

## 2017-08-08 NOTE — Therapy (Signed)
[facility] PHYSICAL AND SPORTS MEDICINE [address], [place], [address] Phone: [phone]   Fax:  [phone]  Physical Therapy Treatment  Patient Details  Name: [name] MRN: [id number] Date of Birth: 10/02/1930 Referring Provider: Dr. Lorenso Courier  Encounter Date: 08/08/2017      PT End of Session - 08/09/17 0833    Visit Number 10   Number of Visits 13   Date for PT Re-Evaluation 08/22/17   PT Start Time 1347   PT Stop Time 1430   PT Time Calculation (min) 43 min   Equipment Utilized During Treatment Gait belt   Activity Tolerance Patient tolerated treatment well   Behavior During Therapy [facility] for tasks assessed/performed      Past Medical History:  Diagnosis Date  . Hypertension     No past surgical history on file.  There were no vitals filed for this visit.      Subjective Assessment - 08/08/17 1359    Subjective Pt reports she is doing well on this date. She states that she likes to come for therapy twice a week but the primary therapist decreased her frequency to once/week. She is performing HEP and states that exercises are going well except she has some difficulty getting the bands around her knees.    Pertinent History She had a previous cervical surgery when she was 59, has 3 surgeries in her lumbar spine.    Limitations Walking;House hold activities   Patient Stated Goals To be able to walk her dog around the neighborhood and be able to garden.    Currently in Pain? No/denies            [facility] PT Assessment - 08/09/17 0838      Standardized Balance Assessment   Standardized Balance Assessment 10 meter walk test;Five Times Sit to Stand;Timed Up and Go Test   Five times sit to stand comments  Unable to perform without UE support   10 Meter Walk 13.41s = 0.75 m/s     Timed Up and Go Test   TUG Normal TUG   Normal TUG (seconds) 16.6     Functional Gait  Assessment   Gait assessed  Yes   Gait Level  Surface Walks 20 ft in less than 7 sec but greater than 5.5 sec, uses assistive device, slower speed, mild gait deviations, or deviates 6-10 in outside of the 12 in walkway width.   Change in Gait Speed Makes only minor adjustments to walking speed, or accomplishes a change in speed with significant gait deviations, deviates 10-15 in outside the 12 in walkway width, or changes speed but loses balance but is able to recover and continue walking.   Gait with Horizontal Head Turns Performs head turns with moderate changes in gait velocity, slows down, deviates 10-15 in outside 12 in walkway width but recovers, can continue to walk.   Gait with Vertical Head Turns Performs task with moderate change in gait velocity, slows down, deviates 10-15 in outside 12 in walkway width but recovers, can continue to walk.   Gait and Pivot Turn Turns slowly, requires verbal cueing, or requires several small steps to catch balance following turn and stop   Step Over Obstacle Cannot perform without assistance.   Gait with Narrow Base of Support Ambulates less than 4 steps heel to toe or cannot perform without assistance.   Gait with Eyes Closed Cannot walk 20 ft without assistance, severe gait deviations or imbalance, deviates greater  than 15 in outside 12 in walkway width or will not attempt task.   Ambulating Backwards Walks 20 ft, uses assistive device, slower speed, mild gait deviations, deviates 6-10 in outside 12 in walkway width.   Steps Alternating feet, must use rail.   Total Score 10       TREATMENT  There-ex Outcome measures: TUG 16.6s, attempted sit to stand without UE support but unable to perform, FGA: 10/30, 44m gait speed: 13.41s= 0.75 m/s with spc  Sit to stand with minA+1 from therapist x 5, heavy cues for anterior weight shifting;  Neuromuscular Re-education Airex balance with toe taps on treadmill, alternating LE x 15 each; Airex balance with feet together and eyes closed 30s x 2; Lateral side  stepping over spc with faded UE support, constant CGA/minA+1 from therapist Forward/backward stepping over spc with faded UE support, constant CGA/minA+1 from therapist; Attempted side stepping over Airex balance beam but too challenging for patient; Heel/toe raises x 15 each; Semitandem balance with horizontal head turns 30s x 2;                    PT Education - 08/09/17 0832    Education provided Yes   Education Details Exercise technique, progress with goals   Person(s) Educated Patient   Methods Explanation   Comprehension Verbalized understanding             PT Long Term Goals - 08/08/17 1352      PT LONG TERM GOAL #1   Title Patient will be able to demonstrate sit to stand without use of UEs demonstrating improved functional LE strength.    Baseline Unable to stand without use of UEs; 08/08/17: unable to perform without UE support   Time 8   Period Weeks   Status On-going     PT LONG TERM GOAL #2   Title Patient will demonstrate FGA of at least 12/30 to demonstrate improved dynamic balance to demonstrate improved safety with community mobility.    Baseline 7/30 at initial evaluation; 08/08/17: 10/30   Time 8   Period Weeks   Status On-going     PT LONG TERM GOAL #3   Title Patient will demonstrate 5x sit to stand and TUG score of less than 17" to demonstrate improved safety with ambulation.    Baseline 08/08/17: TUG: 16.6s; 5TSTS: unable to perform   Time 8   Period Weeks   Status On-going     PT LONG TERM GOAL #4   Title Patient will be able to ambulate around her neighborhood with her dog to return to previous level of activity.    Baseline Unable to complete. 08/08/17: pt still unable to perform   Time 8   Period Weeks   Status On-going               Plan - 08/09/17 3710    Clinical Impression Statement Pt is making progress toward reaching her goals with therapist on this date. Her TUG has improved to below 17s as outlined in her  initial goals. She is still unable to perform a sit to stand transfer without utilizing UE support. Her FGA improved from 7/30 on initial evaluation to 10/30 today. She continues to struggle with head turns during ambulation and loses her balance almost immediately when trying to close her eyes during ambulation. She will continue to benefit from skilled PT interventions for improving strength and balance. Pt encouraged to discuss the PT freuqency with her primary therapist.  Continue HEP and follow-up as scheduled.    Clinical Presentation Stable   Clinical Decision Making Moderate   Rehab Potential Fair   PT Frequency 2x / week   PT Duration 6 weeks   PT Treatment/Interventions Aquatic Therapy;Moist Heat;Cryotherapy;Electrical Stimulation;Ultrasound;Therapeutic exercise;Therapeutic activities;Gait training;Stair training;Neuromuscular re-education;Manual techniques;Taping;Patient/family education   PT Next Visit Plan Progress dynamic functional balance and generalized strengthening.    PT Home Exercise Plan Supine bridging, mini squats, standing hip abductions with yellow t-bands, side stepping with yellow t-band   Consulted and Agree with Plan of Care Patient      Patient will benefit from skilled therapeutic intervention in order to improve the following deficits and impairments:  Abnormal gait, Decreased activity tolerance, Decreased balance, Decreased endurance, Decreased strength, Difficulty walking, Decreased knowledge of precautions, Decreased mobility, Pain  Visit Diagnosis: Difficulty in walking, not elsewhere classified       G-Codes - 08/19/17 0841    Functional Assessment Tool Used (Outpatient Only) 28m walk, FGA, TUG, 5x sit to stand   Functional Limitation Mobility: Walking and moving around   Mobility: Walking and Moving Around Current Status [phone]) At least 40 percent but less than 60 percent impaired, limited or restricted   Mobility: Walking and Moving Around Goal Status  [phone]) At least 1 percent but less than 20 percent impaired, limited or restricted      Problem List There are no active problems to display for this patient.  Lynnea Maizes PT, DPT   Valene Villa 2017-08-19, 8:45 AM  [place] [facility] PHYSICAL AND SPORTS MEDICINE [address] [place], [address] Phone: [phone]   Fax:  [phone]  Name: KATRINKA YOKOTA MRN: [id number] Date of Birth: 1930-10-31

## 2017-08-15 ENCOUNTER — Ambulatory Visit: Payer: Medicare Other | Admitting: Physical Therapy

## 2017-08-16 ENCOUNTER — Ambulatory Visit: Payer: Medicare Other | Attending: Family Medicine | Admitting: Physical Therapy

## 2017-08-16 DIAGNOSIS — R262 Difficulty in walking, not elsewhere classified: Secondary | ICD-10-CM

## 2017-08-16 DIAGNOSIS — M25511 Pain in right shoulder: Secondary | ICD-10-CM | POA: Diagnosis present

## 2017-08-16 DIAGNOSIS — M25512 Pain in left shoulder: Secondary | ICD-10-CM | POA: Diagnosis present

## 2017-08-16 DIAGNOSIS — M542 Cervicalgia: Secondary | ICD-10-CM | POA: Insufficient documentation

## 2017-08-16 DIAGNOSIS — G8929 Other chronic pain: Secondary | ICD-10-CM | POA: Diagnosis present

## 2017-08-16 NOTE — Patient Instructions (Signed)
Sit to stands with no use of UEs with 1 pillow from green chair (noted significant bilateral valgus) x 10 (added red t-band around her knees)

## 2017-08-17 NOTE — Therapy (Signed)
[place] [facility] PHYS[facility]ORTS MEDICINE [address] [place], [address] Phone: [phone]   Fax:  [phone]  Physical Therapy Treatment  Patient Details  Name: [name] MRN: [id number] Date of Birth: 10-21-1930 Referring Provider: Dr. Lorenso Courier  Encounter Date: 08/16/2017      PT End of Session - 08/16/17 1144    Visit Number 11   Number of Visits 13   Date for PT Re-Evaluation 08/22/17   PT Start Time 1136   PT Stop Time 1205   PT Time Calculation (min) 29 min   Equipment Utilized During Treatment Gait belt   Activity Tolerance Patient tolerated treatment well   Behavior During Therapy [facility] for tasks assessed/performed      Past Medical History:  Diagnosis Date  . Hypertension     No past surgical history on file.  There were no vitals filed for this visit.      Subjective Assessment - 08/16/17 1136    Subjective Patient reports she is doing well, no new falls but would like to start coming 2x per week. She reports she has been doing her HEP diligently.    Pertinent History She had a previous cervical surgery when she was 34, has 3 surgeries in her lumbar spine.    Limitations Walking;House hold activities   Patient Stated Goals To be able to walk her dog around the neighborhood and be able to garden.    Currently in Pain? No/denies      Sit to stands with no use of UEs with 1 pillow from green chair (noted significant bilateral valgus) x 10 (added red t-band around her knees) x 4 sets  Tandem Stance x 8 repetitions for 15" holds x 2 bouts bilaterally (significant reduction in the amount of UE support she required.  Tandem stance on blue foam x 5 repetitions for 15" holds with cga from therapist and intermittent use of UEs for support from treadmill x 2 bouts bilaterally   Tandem stance gait x 5 laps at the edge of treadmill with cuing for technique and balance tips from therapist, intermittent use of UEs.                             PT Education - 08/16/17 1147    Education provided Yes   Education Details Progressed HEP to include tandem walking with HHA.    Person(s) Educated Patient   Methods Explanation   Comprehension Verbalized understanding             PT Long Term Goals - 08/08/17 1352      PT LONG TERM GOAL #1   Title Patient will be able to demonstrate sit to stand without use of UEs demonstrating improved functional LE strength.    Baseline Unable to stand without use of UEs; 08/08/17: unable to perform without UE support   Time 8   Period Weeks   Status On-going     PT LONG TERM GOAL #2   Title Patient will demonstrate FGA of at least 12/30 to demonstrate improved dynamic balance to demonstrate improved safety with community mobility.    Baseline 7/30 at initial evaluation; 08/08/17: 10/30   Time 8   Period Weeks   Status On-going     PT LONG TERM GOAL #3   Title Patient will demonstrate 5x sit to stand and TUG score of less than 17" to demonstrate improved safety with ambulation.  Baseline 08/08/17: TUG: 16.6s; 5TSTS: unable to perform   Time 8   Period Weeks   Status On-going     PT LONG TERM GOAL #4   Title Patient will be able to ambulate around her neighborhood with her dog to return to previous level of activity.    Baseline Unable to complete. 08/08/17: pt still unable to perform   Time 8   Period Weeks   Status On-going               Plan - 08/16/17 1147    Clinical Impression Statement Patient continues to have deficits with LE strength (adduction and strong use of UEs) to perform sit to stands. She is still having difficulty with progressive balance tasks as well (holding tandem stance, moving tandem stance). She will progressively require increase in balance activities, though this is a long process.    Clinical Presentation Stable   Clinical Decision Making Moderate   Rehab Potential Fair   PT Frequency 2x / week    PT Duration 6 weeks   PT Treatment/Interventions Aquatic Therapy;Moist Heat;Cryotherapy;Electrical Stimulation;Ultrasound;Therapeutic exercise;Therapeutic activities;Gait training;Stair training;Neuromuscular re-education;Manual techniques;Taping;Patient/family education   PT Next Visit Plan Progress dynamic functional balance and generalized strengthening.    PT Home Exercise Plan Supine bridging, mini squats, standing hip abductions with yellow t-bands, side stepping with yellow t-band   Consulted and Agree with Plan of Care Patient      Patient will benefit from skilled therapeutic intervention in order to improve the following deficits and impairments:  Abnormal gait, Decreased activity tolerance, Decreased balance, Decreased endurance, Decreased strength, Difficulty walking, Decreased knowledge of precautions, Decreased mobility, Pain  Visit Diagnosis: Difficulty in walking, not elsewhere classified  Neck pain  Chronic left shoulder pain     Problem List There are no active problems to display for this patient.  Alva Garnet PT, DPT, CSCS    08/17/2017, 12:46 PM  [place] [facility] PHYSICAL AND SPORTS MEDICINE [address] [place], [address] Phone: [phone]   Fax:  [phone]  Name: [name] MRN: [id number] Date of Birth: 1930-08-31

## 2017-08-22 ENCOUNTER — Ambulatory Visit: Payer: Medicare Other | Admitting: Physical Therapy

## 2017-08-22 DIAGNOSIS — G8929 Other chronic pain: Secondary | ICD-10-CM

## 2017-08-22 DIAGNOSIS — M25512 Pain in left shoulder: Secondary | ICD-10-CM

## 2017-08-22 DIAGNOSIS — R262 Difficulty in walking, not elsewhere classified: Secondary | ICD-10-CM | POA: Diagnosis not present

## 2017-08-22 DIAGNOSIS — M542 Cervicalgia: Secondary | ICD-10-CM

## 2017-08-22 NOTE — Patient Instructions (Signed)
TUG with SPC -15.10 seconds  Almost able to perform sit to stand without UEs, she is able to get up but falls backwards.   Sit to stands with 1 pillow- intermittently able to complete, regressed to 2 pillows with therapist providing reinforcement to avoid adduction x 8 repetitions, x 10 repetitions, tried for 1 pillow on last set x 5 repetitions   Step Ups to 1 riser with HHA bilaterally x 12, to 2 risers x 12 with 1 HHA   Soft tissue mobilization  Standing hip abductions

## 2017-08-22 NOTE — Therapy (Signed)
[place] [facility] PHYS[facility]ORTS MEDICINE [address], [place], [address] Phone: [phone]   Fax:  [phone]  Physical Therapy Treatment  Patient Details  Name: [name] MRN: [id number] Date of Birth: June 12, 1930 Referring Provider: Dr. Lorenso Courier  Encounter Date: 08/22/2017      PT End of Session - 08/22/17 0959    Visit Number 12   Number of Visits 25   Date for PT Re-Evaluation 10/17/17   PT Start Time 0954   PT Stop Time 1032   PT Time Calculation (min) 38 min   Equipment Utilized During Treatment Gait belt   Activity Tolerance Patient tolerated treatment well   Behavior During Therapy [facility] for tasks assessed/performed      Past Medical History:  Diagnosis Date  . Hypertension     No past surgical history on file.  There were no vitals filed for this visit.      Subjective Assessment - 08/22/17 0958    Subjective Patient reports she is no better or wose. She has been working on her sit to stands and LE strength. She reports she is still having R shoulder pain "due to bursitis".    Pertinent History She had a previous cervical surgery when she was 10, has 3 surgeries in her lumbar spine.    Limitations Walking;House hold activities   Patient Stated Goals To be able to walk her dog around the neighborhood and be able to garden.    Currently in Pain? No/denies      TUG with SPC -15.10 seconds  Almost able to perform sit to stand without UEs, she is able to get up but falls backwards.   Sit to stands with 1 pillow- intermittently able to complete, regressed to 2 pillows with therapist providing reinforcement to avoid adduction x 8 repetitions, x 10 repetitions, tried for 1 pillow on last set x 5 repetitions   Step Ups to 1 riser with HHA bilaterally x 12, to 2 risers x 12 with 1 HHA   Soft tissue mobilization provided to cervical extensor musculature with patient reporting relief after completion.  Standing hip  abductions  With red t-band x12, with red and yellow t-band x 12 for 2 sets.                            PT Education - 08/22/17 1642    Education provided Yes   Education Details Demonstrating improving LE strength, though her balance deficits will take some time to improve.    Person(s) Educated Patient   Methods Explanation   Comprehension Verbalized understanding             PT Long Term Goals - 08/08/17 1352      PT LONG TERM GOAL #1   Title Patient will be able to demonstrate sit to stand without use of UEs demonstrating improved functional LE strength.    Baseline Unable to stand without use of UEs; 08/08/17: unable to perform without UE support   Time 8   Period Weeks   Status On-going     PT LONG TERM GOAL #2   Title Patient will demonstrate FGA of at least 12/30 to demonstrate improved dynamic balance to demonstrate improved safety with community mobility.    Baseline 7/30 at initial evaluation; 08/08/17: 10/30   Time 8   Period Weeks   Status On-going     PT LONG TERM GOAL #3  Title Patient will demonstrate 5x sit to stand and TUG score of less than 17" to demonstrate improved safety with ambulation.    Baseline 08/08/17: TUG: 16.6s; 5TSTS: unable to perform   Time 8   Period Weeks   Status On-going     PT LONG TERM GOAL #4   Title Patient will be able to ambulate around her neighborhood with her dog to return to previous level of activity.    Baseline Unable to complete. 08/08/17: pt still unable to perform   Time 8   Period Weeks   Status On-going               Plan - 08/22/17 0959    Clinical Impression Statement Patient demonstrates multiple attempts where she is almost able to perform sit to stands without use of her UEs. She progressed well with hip abduction exercises, though she is having residual tightness/pain/discomfort in her posterior cervical spine secondary to surgery. She does report intermittent R long head of  biceps area pain as well with activity. Her balance scores are slowly but steadily improving.    Clinical Presentation Stable   Clinical Decision Making Moderate   Rehab Potential Fair   PT Frequency 2x / week   PT Duration 6 weeks   PT Treatment/Interventions Aquatic Therapy;Moist Heat;Cryotherapy;Electrical Stimulation;Ultrasound;Therapeutic exercise;Therapeutic activities;Gait training;Stair training;Neuromuscular re-education;Manual techniques;Taping;Patient/family education   PT Next Visit Plan Progress dynamic functional balance and generalized strengthening.    PT Home Exercise Plan Supine bridging, mini squats, standing hip abductions with yellow t-bands, side stepping with yellow t-band   Consulted and Agree with Plan of Care Patient      Patient will benefit from skilled therapeutic intervention in order to improve the following deficits and impairments:  Abnormal gait, Decreased activity tolerance, Decreased balance, Decreased endurance, Decreased strength, Difficulty walking, Decreased knowledge of precautions, Decreased mobility, Pain  Visit Diagnosis: Neck pain  Chronic left shoulder pain  Difficulty in walking, not elsewhere classified     Problem List There are no active problems to display for this patient.  Alva Garnet PT, DPT, CSCS    08/22/2017, 4:46 PM  [place] [facility] PHYSICAL AND SPORTS MEDICINE [address] [place], [address] Phone: [phone]   Fax:  [phone]  Name: KANDANCE YANO MRN: [id number] Date of Birth: 1930-07-22

## 2017-08-29 ENCOUNTER — Ambulatory Visit: Payer: Medicare Other | Admitting: Physical Therapy

## 2017-08-29 DIAGNOSIS — R262 Difficulty in walking, not elsewhere classified: Secondary | ICD-10-CM

## 2017-08-29 DIAGNOSIS — G8929 Other chronic pain: Secondary | ICD-10-CM

## 2017-08-29 DIAGNOSIS — M25512 Pain in left shoulder: Principal | ICD-10-CM

## 2017-08-29 DIAGNOSIS — M542 Cervicalgia: Secondary | ICD-10-CM

## 2017-08-29 NOTE — Therapy (Signed)
[place] [facility] PHYSICAL AND SPORTS MEDICINE [address], [place], [address] Phone: [phone]   Fax:  [phone]  Physical Therapy Treatment  Patient Details  Name: [name] MRN: [id number] Date of Birth: 12-19-1929 Referring Provider: Dr. Lorenso Courier  Encounter Date: 08/29/2017      PT End of Session - 08/29/17 1403    Visit Number 13   Number of Visits 25   Date for PT Re-Evaluation 10/17/17   PT Start Time 1355   PT Stop Time 1430   PT Time Calculation (min) 35 min   Equipment Utilized During Treatment Gait belt   Activity Tolerance Patient tolerated treatment well   Behavior During Therapy [facility] for tasks assessed/performed      Past Medical History:  Diagnosis Date  . Hypertension     No past surgical history on file.  There were no vitals filed for this visit.      Subjective Assessment - 08/29/17 1356    Subjective Patient reports today has been having more issues with neuropathy in her LEs today. She has been using her SPC for at least 6 months.   Pertinent History She had a previous cervical surgery when she was 28, has 3 surgeries in her lumbar spine.    Limitations Walking;House hold activities   Patient Stated Goals To be able to walk her dog around the neighborhood and be able to garden.    Currently in Pain? No/denies      TRX sit to stands x 15  Able to stand up from green chair x 5 repetitions before she was too fatigued to complete more  Standing hip abductions on blue side of BOSU up x 15 per side for 2 sets (with 1 finger assist bilaterally)  Step ups (lateral modified) to Blue side up of BOSU x10 repetitions   Patient asked therapist to screen her R shoulder   Palpation - tender throughout distal arm (by bicipital transition to tendon), not around G-H joint   ER passively in sitting is WNL and non painful  AROM into flexion is WNL with no pain   Elbow flexion/extension MMT - 5/5 with no pain    Soft tissue mobilization to posterior cervical musculature which patient reports is relieving of tension and pressure she had been feeling in that area.                            PT Education - 08/29/17 1403    Education provided Yes   Education Details If she is interested in PT for her arm, she will need a referral.    Person(s) Educated Patient   Methods Explanation;Demonstration   Comprehension Verbalized understanding;Returned demonstration             PT Long Term Goals - 08/08/17 1352      PT LONG TERM GOAL #1   Title Patient will be able to demonstrate sit to stand without use of UEs demonstrating improved functional LE strength.    Baseline Unable to stand without use of UEs; 08/08/17: unable to perform without UE support   Time 8   Period Weeks   Status On-going     PT LONG TERM GOAL #2   Title Patient will demonstrate FGA of at least 12/30 to demonstrate improved dynamic balance to demonstrate improved safety with community mobility.    Baseline 7/30 at initial evaluation; 08/08/17: 10/30   Time 8  Period Weeks   Status On-going     PT LONG TERM GOAL #3   Title Patient will demonstrate 5x sit to stand and TUG score of less than 17" to demonstrate improved safety with ambulation.    Baseline 08/08/17: TUG: 16.6s; 5TSTS: unable to perform   Time 8   Period Weeks   Status On-going     PT LONG TERM GOAL #4   Title Patient will be able to ambulate around her neighborhood with her dog to return to previous level of activity.    Baseline Unable to complete. 08/08/17: pt still unable to perform   Time 8   Period Weeks   Status On-going               Plan - 08/29/17 1404    Clinical Impression Statement Patient demonstrates improvement in her LE strength demonstrated by ability to perform sit to stands consecutively without use of her UEs. She does faitgue quickly, failing after her 4th in a row, but this represents a significant  improvement from previous sessions. She is able to perform single leg step ups to a BOSU, but again fatigues quickly. She is progressing nicely in strength development and will be progressed to more advanced balance activities to facilitate transmutation of strength to improved balance.    Clinical Presentation Stable   Clinical Decision Making Moderate   Rehab Potential Fair   PT Frequency 2x / week   PT Duration 6 weeks   PT Treatment/Interventions Aquatic Therapy;Moist Heat;Cryotherapy;Electrical Stimulation;Ultrasound;Therapeutic exercise;Therapeutic activities;Gait training;Stair training;Neuromuscular re-education;Manual techniques;Taping;Patient/family education   PT Next Visit Plan Progress dynamic functional balance and generalized strengthening.    PT Home Exercise Plan Supine bridging, mini squats, standing hip abductions with yellow t-bands, side stepping with yellow t-band   Consulted and Agree with Plan of Care Patient      Patient will benefit from skilled therapeutic intervention in order to improve the following deficits and impairments:  Abnormal gait, Decreased activity tolerance, Decreased balance, Decreased endurance, Decreased strength, Difficulty walking, Decreased knowledge of precautions, Decreased mobility, Pain  Visit Diagnosis: Chronic left shoulder pain  Difficulty in walking, not elsewhere classified  Neck pain     Problem List There are no active problems to display for this patient.  Alva Garnet PT, DPT, CSCS    08/29/2017, 2:41 PM  [facility] PHYSICAL AND SPORTS MEDICINE [address] [place], [address] Phone: [phone]   Fax:  [phone]  Name: [name] MRN: [id number] Date of Birth: July 02, 1930

## 2017-08-29 NOTE — Patient Instructions (Addendum)
TRX sit to stands x 15  Able to stand up from green chair x 5 repetitions before she was too fatigued to complete more  Standing hip abductions on blue side of BOSU up x 15 per side for 2 sets (with 1 finger assist bilaterally)  Step ups (lateral modified) to Blue side up of BOSU x10 repetitions   Patient asked therapist to screen her R shoulder   Palpation - tender throughout distal arm (by bicipital transition to tendon), not around G-H joint   ER passively in sitting is WNL and non painful  AROM into flexion is WNL with no pain   Elbow flexion/extension MMT - 5/5 with no pain

## 2017-09-01 ENCOUNTER — Ambulatory Visit: Payer: Medicare Other | Admitting: Physical Therapy

## 2017-09-01 DIAGNOSIS — M542 Cervicalgia: Secondary | ICD-10-CM

## 2017-09-01 DIAGNOSIS — G8929 Other chronic pain: Secondary | ICD-10-CM

## 2017-09-01 DIAGNOSIS — M25512 Pain in left shoulder: Principal | ICD-10-CM

## 2017-09-01 DIAGNOSIS — R262 Difficulty in walking, not elsewhere classified: Secondary | ICD-10-CM

## 2017-09-01 NOTE — Patient Instructions (Addendum)
5x sit to stands -34.80 seconds without her hands  TUG - 16.02 seconds with SPC, 14.95 seconds with SPC on second bout   SLS on blue foam with 1 finger assist   50m walk - 13.36 seconds with SPC   Step ups with min A from 1 finger x 12 for 2 sets   Obstacle course with turns and half balls she had to step on and over x 2 bouts with HHA and SPC   5x sit to stand - 13.61 seconds with hands

## 2017-09-01 NOTE — Therapy (Signed)
[place] [facility] AND SPORTS MEDIC[facility] [address], [place], [address] Phone: [phone]   Fax:  [phone]  Physical Therapy Treatment  Patient Details  Name: [name] MRN: [id number] Date of Birth: [birth year] Referring Provider: Dr. Prince Rome  Encounter Date: 09/01/2017      PT End of Session - 09/01/17 1220    Visit Number 14   Number of Visits 25   Date for PT Re-Evaluation 10/17/17   PT Start Time 1220   PT Stop Time 1300   PT Time Calculation (min) 40 min   Equipment Utilized During Treatment Gait belt   Activity Tolerance Patient tolerated treatment well   Behavior During Therapy [facility] for tasks assessed/performed      Past Medical History:  Diagnosis Date  . Hypertension     No past surgical history on file.  There were no vitals filed for this visit.      Subjective Assessment - 09/01/17 1406    Subjective Patient reports she discussed with Dr. Ammie Ferrier office about a PT referral for her R shoulder, she has an old note indicating she has a R biceps tendon rupture.    Pertinent History She had a previous cervical surgery when she was 56, has 3 surgeries in her lumbar spine.    Limitations Walking;House hold activities   Patient Stated Goals To be able to walk her dog around the neighborhood and be able to garden.    Currently in Pain? No/denies      5x sit to stands -34.80 seconds without her hands  TUG - 16.02 seconds with SPC, 14.95 seconds with SPC on second bout   SLS on blue foam with 1 finger assist x5" progressed to no assistance to complete x 2 sets   40mwalk - 13.36 seconds with SPC   Step ups with min A from 1 finger x 12 for 2 sets onto blue side of BOSU  Obstacle course with turns and half balls she had to step on and over x 2 bouts with HHA and SPC   5x sit to stand - 13.61 seconds with hands                            PT Education - 09/01/17 1406    Education  provided Yes   Education Details Significant improvement in balance outcome measures, will add in tandem gait to follow up.    Person(s) Educated Patient   Methods Explanation   Comprehension Verbalized understanding             PT Long Term Goals - 09/01/17 1226      PT LONG TERM GOAL #1   Title Patient will be able to demonstrate sit to stand without use of UEs demonstrating improved functional LE strength.    Baseline Unable to stand without use of UEs; 08/08/17: unable to perform without UE support   Time 8   Period Weeks   Status Achieved     PT LONG TERM GOAL #2   Title Patient will demonstrate FGA of at least 12/30 to demonstrate improved dynamic balance to demonstrate improved safety with community mobility.    Baseline 7/30 at initial evaluation; 08/08/17: 10/30   Time 8   Period Weeks   Status On-going     PT LONG TERM GOAL #3   Title Patient will demonstrate 5x sit to stand and TUG score of less than 17"  to demonstrate improved safety with ambulation.    Baseline 08/08/17: TUG: 16.6s; 5TSTS: unable to perform   Time 8   Period Weeks   Status Partially Met     PT LONG TERM GOAL #4   Title Patient will be able to ambulate around her neighborhood with her dog to return to previous level of activity.    Baseline Unable to complete. 08/08/17: pt still unable to perform   Time 8   Period Weeks   Status On-going               Plan - 09/01/17 1220    Clinical Impression Statement Patient has shown tremendous improvement in TUG, 5x sit to stand, and 73mwalk times. Her sagittal plane motion has improved tremendously, thus will begin to incorporate more frontal and rotational plane demands as this is where he primary deficits lie. She has progressed quite well thus far.    Clinical Presentation Stable   Clinical Decision Making Moderate   Rehab Potential Fair   PT Frequency 2x / week   PT Duration 6 weeks   PT Treatment/Interventions Aquatic Therapy;Moist  Heat;Cryotherapy;Electrical Stimulation;Ultrasound;Therapeutic exercise;Therapeutic activities;Gait training;Stair training;Neuromuscular re-education;Manual techniques;Taping;Patient/family education   PT Next Visit Plan Progress dynamic functional balance and generalized strengthening.    PT Home Exercise Plan Supine bridging, mini squats, standing hip abductions with yellow t-bands, side stepping with yellow t-band   Consulted and Agree with Plan of Care Patient      Patient will benefit from skilled therapeutic intervention in order to improve the following deficits and impairments:  Abnormal gait, Decreased activity tolerance, Decreased balance, Decreased endurance, Decreased strength, Difficulty walking, Decreased knowledge of precautions, Decreased mobility, Pain  Visit Diagnosis: Chronic left shoulder pain  Difficulty in walking, not elsewhere classified  Neck pain     Problem List There are no active problems to display for this patient.  PRoyce MacadamiaPT, DPT, CSCS    09/01/2017, 2:09 PM  [facility] [address] [place] [address]Phone: [phone]  Fax:  [phone] Name: JMERRIE EPLERMRN: [id number] of Birth: 103/01/1930

## 2017-09-04 ENCOUNTER — Encounter: Payer: Medicare Other | Admitting: Physical Therapy

## 2017-09-06 ENCOUNTER — Ambulatory Visit: Payer: Medicare Other | Admitting: Physical Therapy

## 2017-09-06 DIAGNOSIS — M25512 Pain in left shoulder: Principal | ICD-10-CM

## 2017-09-06 DIAGNOSIS — R262 Difficulty in walking, not elsewhere classified: Secondary | ICD-10-CM | POA: Diagnosis not present

## 2017-09-06 DIAGNOSIS — M25511 Pain in right shoulder: Secondary | ICD-10-CM

## 2017-09-06 DIAGNOSIS — M542 Cervicalgia: Secondary | ICD-10-CM

## 2017-09-06 DIAGNOSIS — G8929 Other chronic pain: Secondary | ICD-10-CM

## 2017-09-06 NOTE — Therapy (Addendum)
[place] PHYSICAL AND SPORTS MEDICINE [address], [place], [address] Phone: [phone]   Fax:  [phone]  Physical Therapy Re-evaluation  Patient Details  Name: [name] MRN: [id number] Date of Birth: 16-Sep-1930 Referring Provider: Dr. Prince Rome  Encounter Date: 09/06/2017    Past Medical History:  Diagnosis Date  . Hypertension     No past surgical history on file.  There were no vitals filed for this visit.    RUE flexion to 140 degrees  LUE to 147 degrees (flexion)   Abduction to 112 degrees bilaterally (with no pain)   R IR, ER, elbow flexion/extension, retraction all 5/5 with minimal discomfort   Negative speed's test in supine   Palpation over the biceps and into middle deltoid were all painful and reproductive of her pain.   PROM for ER/IR WNL with no pain   Manual Therapy and directed exercise intervention completed:  Performed soft tissue mobilization over biceps long head tendon, into middle deltoid area, and distally into mid-belly of biceps as this is where she was most tender, with significant reduction in tenderness/reactivity.   Educated and observed patient complete sidelying ERs with 1# DB with towel between ribcage and elbow x 10 repetitions   Educated and observed patient complete bilateral ER with yellow t-band x 10 repetitions with towel between ribcage and elbow x 10 repetitions                                 PT Long Term Goals - 09/06/17 1251      PT LONG TERM GOAL #1   Title Patient will be able to demonstrate sit to stand without use of UEs demonstrating improved functional LE strength.    Baseline Unable to stand without use of UEs; 08/08/17: unable to perform without UE support   Time 8   Period Weeks   Status Achieved     PT LONG TERM GOAL #2   Title Patient will demonstrate FGA of at least 12/30 to demonstrate improved dynamic balance to demonstrate  improved safety with community mobility.    Baseline 7/30 at initial evaluation; 08/08/17: 10/30   Time 8   Period Weeks   Status On-going     PT LONG TERM GOAL #3   Title Patient will demonstrate 5x sit to stand and TUG score of less than 17" to demonstrate improved safety with ambulation.    Baseline 08/08/17: TUG: 16.6s; 5TSTS: unable to perform   Time 8   Period Weeks   Status Partially Met     PT LONG TERM GOAL #4   Title Patient will be able to ambulate around her neighborhood with her dog to return to previous level of activity.    Baseline Unable to complete. 08/08/17: pt still unable to perform   Time 8   Period Weeks   Status On-going     PT LONG TERM GOAL #5   Title Patient will report worst pain of no more than 1/10 with shifting gears or lifting her pocket up to demonstrate improved tolerance for ADLs.    Baseline Moderate to severe pain and inability to complete.    Time 6   Period Weeks   Status New   Target Date 10/18/17             Patient will benefit from skilled therapeutic intervention in order to improve the following deficits and impairments:  Abnormal gait, Decreased activity tolerance, Decreased balance, Decreased endurance, Decreased strength, Difficulty walking, Decreased knowledge of precautions, Decreased mobility, Pain, Decreased range of motion  Visit Diagnosis: Chronic left shoulder pain - Plan: PT plan of care cert/re-cert  Difficulty in walking, not elsewhere classified - Plan: PT plan of care cert/re-cert  Neck pain - Plan: PT plan of care cert/re-cert  Chronic right shoulder pain - Plan: PT plan of care cert/re-cert       G-Codes - 09-13-2017 4765    Functional Assessment Tool Used (Outpatient Only) Pain report and ROM provided by patient    Functional Limitation Carrying, moving and handling objects   Carrying, Moving and Handling Objects Current Status (Y6503) At least 1 percent but less than 20 percent impaired, limited or  restricted   Carrying, Moving and Handling Objects Goal Status (T4656) At least 1 percent but less than 20 percent impaired, limited or restricted      Problem List There are no active problems to display for this patient.  Royce Macadamia PT, DPT, CSCS   Late addendum: G-codes on Sep 13, 2017 Royce Macadamia PT, DPT, CSCS    09/13/2017, 9:18 AM  [place] PH[facility] AND SPOR[facility] [address] [place], [address] Phone: [phone]   Fax:  [phone]  Name: [name] MRN: [id number] Date of Birth: January 10, 1930

## 2017-09-06 NOTE — Patient Instructions (Addendum)
RUE flexion to 140 degrees  LUE to 147 degrees (flexion)   Abduction to 112 degrees bilaterally (with no pain)   R IR, ER, elbow flexion/extension, retraction all 5/5 with minimal discomfort   Negative speed's test in supine   Palpation over the biceps and into middle deltoid were all painful and reproductive of her pain.   PROM for ER/IR WNL with no pain

## 2017-09-08 ENCOUNTER — Ambulatory Visit: Payer: Medicare Other | Admitting: Physical Therapy

## 2017-09-08 DIAGNOSIS — G8929 Other chronic pain: Secondary | ICD-10-CM

## 2017-09-08 DIAGNOSIS — M25512 Pain in left shoulder: Principal | ICD-10-CM

## 2017-09-08 DIAGNOSIS — R262 Difficulty in walking, not elsewhere classified: Secondary | ICD-10-CM | POA: Diagnosis not present

## 2017-09-08 DIAGNOSIS — M542 Cervicalgia: Secondary | ICD-10-CM

## 2017-09-08 DIAGNOSIS — M25511 Pain in right shoulder: Secondary | ICD-10-CM

## 2017-09-08 NOTE — Therapy (Signed)
[place] PH[facility] AND SPORTS MEDIC[facility] [address] [place], [address] Phone: [phone]   Fax:  [phone]  Physical Therapy Treatment  Patient Details  Name: [name] MRN: [id number] Date of Birth: 02/20/1930 Referring Provider: Dr. Prince Rome  Encounter Date: 09/08/2017      PT End of Session - 09/08/17 1314    Visit Number 16   Number of Visits 25   Date for PT Re-Evaluation 11/01/17   PT Start Time 1228   PT Stop Time 1310   PT Time Calculation (min) 42 min   Activity Tolerance Patient tolerated treatment well   Behavior During Therapy [facility] for tasks assessed/performed      Past Medical History:  Diagnosis Date  . Hypertension     No past surgical history on file.  There were no vitals filed for this visit.      Subjective Assessment - 09/08/17 1229    Subjective Patient reports her arm has been very sore since the previous session, she reports it is easing, but has been pretty tender. She reports she has noticed a significant improvement in her balance, but reports she is having fear of being bumped and falling in public still.    Pertinent History She had a previous cervical surgery when she was 21, has 3 surgeries in her lumbar spine.    Limitations Walking;House hold activities   Patient Stated Goals To be able to walk her dog around the neighborhood and be able to garden.    Currently in Pain? Yes   Pain Score --  Reports her arm is sore, especially in the biceps area.    Pain Location Arm   Pain Orientation Right   Pain Descriptors / Indicators Aching   Pain Type Chronic pain   Pain Onset More than a month ago   Pain Frequency Intermittent         Soft tissue mobilization through her biceps area and long head of biceps tendon with multiple areas of tenderness which were relieved with graded exposure.   Marching in place on blue foam x30" with difficulty holding her COM over her BOS.  Standing hip  abductions on blue foam pad with 1 finger HHA   1/2 star drill on blue foam with 1 finger assist x 10 repetitions per side   Marching on BOSU x 8 per side with alternating use of UEs   Marching on BOSU without alternating x8 per side with alternating use of her UEs.   Obstacle course -- patient is able to step over canes, round corners with no loss of balance or assistance from her hands, though she still struggles with turning her head to find new direction.   FGA 1.2 2. 3 3. 3 4. 3 5. 3 6. 1 7. 0 8. 0 9. 1 10.  2  18/30  40mwalk with no cane - 12.2 seconds                         PT Education - 09/08/17 1314    Education provided Yes   Education Details Her FGA has improved tremendously, will need to continue to focus on eyes closed and strengthening to allow her to return to walking her dog.    Person(s) Educated Patient   Methods Explanation;Demonstration   Comprehension Verbalized understanding;Returned demonstration             PT Long Term Goals - 09/06/17 1251  PT LONG TERM GOAL #1   Title Patient will be able to demonstrate sit to stand without use of UEs demonstrating improved functional LE strength.    Baseline Unable to stand without use of UEs; 08/08/17: unable to perform without UE support   Time 8   Period Weeks   Status Achieved     PT LONG TERM GOAL #2   Title Patient will demonstrate FGA of at least 12/30 to demonstrate improved dynamic balance to demonstrate improved safety with community mobility.    Baseline 7/30 at initial evaluation; 08/08/17: 10/30   Time 8   Period Weeks   Status On-going     PT LONG TERM GOAL #3   Title Patient will demonstrate 5x sit to stand and TUG score of less than 17" to demonstrate improved safety with ambulation.    Baseline 08/08/17: TUG: 16.6s; 5TSTS: unable to perform   Time 8   Period Weeks   Status Partially Met     PT LONG TERM GOAL #4   Title Patient will be able to  ambulate around her neighborhood with her dog to return to previous level of activity.    Baseline Unable to complete. 08/08/17: pt still unable to perform   Time 8   Period Weeks   Status On-going     PT LONG TERM GOAL #5   Title Patient will report worst pain of no more than 1/10 with shifting gears or lifting her pocket up to demonstrate improved tolerance for ADLs.    Baseline Moderate to severe pain and inability to complete.    Time 6   Period Weeks   Status New   Target Date 10/18/17               Plan - 09-28-2017 1315    Clinical Impression Statement Patient has made excellent progress with her FGA score (improving from baseline score of 7 to 18 today). Her 55mwalk score has reduced tremendously too (21+ seconds with cane initially, 12.2 seconds with no cane today). She certainly has pain and tenderness over her R biceps, however she reports this has decreased from last session as well.    Clinical Presentation Stable   Clinical Decision Making Moderate   Rehab Potential Fair   PT Frequency 2x / week   PT Duration 6 weeks   PT Treatment/Interventions Aquatic Therapy;Moist Heat;Cryotherapy;Electrical Stimulation;Ultrasound;Therapeutic exercise;Therapeutic activities;Gait training;Barnett training;Neuromuscular re-education;Manual techniques;Taping;Patient/family education   PT Next Visit Plan Progress dynamic functional balance and generalized strengthening.    PT Home Exercise Plan Supine bridging, mini squats, standing hip abductions with yellow t-bands, side stepping with yellow t-band   Consulted and Agree with Plan of Care Patient      Patient will benefit from skilled therapeutic intervention in order to improve the following deficits and impairments:  Abnormal gait, Decreased activity tolerance, Decreased balance, Decreased endurance, Decreased strength, Difficulty walking, Decreased knowledge of precautions, Decreased mobility, Pain, Decreased range of motion  Visit  Diagnosis: Chronic left shoulder pain  Difficulty in walking, not elsewhere classified  Neck pain  Chronic right shoulder pain       G-Codes - 010/18/20180918    Functional Assessment Tool Used (Outpatient Only) Pain report and ROM provided by patient    Functional Limitation Carrying, moving and handling objects   Carrying, Moving and Handling Objects Current Status ((R9163 At least 1 percent but less than 20 percent impaired, limited or restricted   Carrying, Moving and Handling Objects Goal Status ((W4665 At  least 1 percent but less than 20 percent impaired, limited or restricted      Problem List There are no active problems to display for this patient.  Royce Macadamia PT, DPT, CSCS    09/08/2017, 1:25 PM  [place] [facility] [address], [place], [address] Phone: [phone]   Fax:  [phone]  Name: [name] MRN: [id number] Date of Birth: 07-Dec-1930

## 2017-09-08 NOTE — Patient Instructions (Addendum)
Soft tissue mobilization through her biceps area and long head of biceps tendon with multiple areas of tenderness which were relieved with graded exposure.   Marching in place on blue foam x30" with difficulty holding her COM over her BOS.  Standing hip abductions on blue foam pad with 1 finger HHA   1/2 star drill on blue foam with 1 finger assist x 10 repetitions per side   Marching on BOSU x 8 per side with alternating use of UEs   Marching on BOSU without alternating x8 per side with alternating use of her UEs.   Obstacle course -- patient is able to step over canes, round corners with no loss of balance or assistance from her hands, though she still struggles with turning her head to find new direction.   FGA 1.2 2. 3 3. 3 4. 3 5. 3 6. 1 7. 0 8. 0 9. 1 10.  2  18/30  74m walk with no cane - 12.2 seconds

## 2017-09-11 ENCOUNTER — Ambulatory Visit: Payer: Medicare Other | Attending: Family Medicine | Admitting: Physical Therapy

## 2017-09-11 DIAGNOSIS — M542 Cervicalgia: Secondary | ICD-10-CM | POA: Diagnosis present

## 2017-09-11 DIAGNOSIS — M25512 Pain in left shoulder: Secondary | ICD-10-CM | POA: Diagnosis present

## 2017-09-11 DIAGNOSIS — G8929 Other chronic pain: Secondary | ICD-10-CM | POA: Diagnosis present

## 2017-09-11 DIAGNOSIS — M25511 Pain in right shoulder: Secondary | ICD-10-CM | POA: Insufficient documentation

## 2017-09-11 DIAGNOSIS — R262 Difficulty in walking, not elsewhere classified: Secondary | ICD-10-CM | POA: Diagnosis present

## 2017-09-11 NOTE — Patient Instructions (Signed)
Isometric into concentric x 12 for 3" holds x 3 sets   Tandem gait on blue foam pad progressing from   Eyes closed Rhomberg stance   Eyes closed marching on blue foam   Eyes closed hip abduction on blue foam

## 2017-09-12 NOTE — Therapy (Signed)
[facility] [address], [place], [address] Phone: [phone]   Fax:  [phone]  Physical Therapy Treatment  Patient Details  Name: [name] MRN: [id number] Date of Birth: 05-31-1930 Referring Provider: Dr. Prince Rome  Encounter Date: 09/11/2017      PT End of Session - 09/12/17 1508    Visit Number 17   Number of Visits 25   Date for PT Re-Evaluation 11/01/17   PT Start Time 1307   PT Stop Time 1345   PT Time Calculation (min) 38 min   Activity Tolerance Patient tolerated treatment well   Behavior During Therapy [facility] for tasks assessed/performed      Past Medical History:  Diagnosis Date  . Hypertension     No past surgical history on file.  There were no vitals filed for this visit.      Subjective Assessment - 09/11/17 1308    Subjective Patient reports her R arm has been much better at night and during the day. She reports shifting the gears in the car has still been uncomfortable, but perhaps a bit better.    Pertinent History She had a previous cervical surgery when she was 24, has 3 surgeries in her lumbar spine.    Limitations Walking;House hold activities   Patient Stated Goals To be able to walk her dog around the neighborhood and be able to garden.    Currently in Pain? No/denies        Isometric into concentric x 12 for 3" holds x 3 sets  For elbow flexion to begin to gradually load her biceps   Tandem gait on blue foam pad progressing from x 2 bouts x 4 repetitions with progression to using less and eventually only intermittent hand support.   Eyes closed Rhomberg stance on blue foam pad x 10" for 3 bouts (mild postural sway noted)  Eyes closed marching on blue foam x 10 per side for 3 bouts with significant postural sway and drifting to R mildly.   Eyes closed hip abduction on blue foam x 12 for 2 sets bilaterally with 1 finger assist for balance.                           PT Education - 09/12/17 1507    Education provided Yes   Education Details She has improved many areas of balance that needed work, will now focus on eyes closed for HEP.   Person(s) Educated Patient   Methods Explanation;Demonstration   Comprehension Verbalized understanding;Returned demonstration             PT Long Term Goals - 09/06/17 1251      PT LONG TERM GOAL #1   Title Patient will be able to demonstrate sit to stand without use of UEs demonstrating improved functional LE strength.    Baseline Unable to stand without use of UEs; 08/08/17: unable to perform without UE support   Time 8   Period Weeks   Status Achieved     PT LONG TERM GOAL #2   Title Patient will demonstrate FGA of at least 12/30 to demonstrate improved dynamic balance to demonstrate improved safety with community mobility.    Baseline 7/30 at initial evaluation; 08/08/17: 10/30   Time 8   Period Weeks   Status On-going     PT LONG TERM GOAL #3   Title Patient will demonstrate 5x sit to stand and  TUG score of less than 17" to demonstrate improved safety with ambulation.    Baseline 08/08/17: TUG: 16.6s; 5TSTS: unable to perform   Time 8   Period Weeks   Status Partially Met     PT LONG TERM GOAL #4   Title Patient will be able to ambulate around her neighborhood with her dog to return to previous level of activity.    Baseline Unable to complete. 08/08/17: pt still unable to perform   Time 8   Period Weeks   Status On-going     PT LONG TERM GOAL #5   Title Patient will report worst pain of no more than 1/10 with shifting gears or lifting her pocket up to demonstrate improved tolerance for ADLs.    Baseline Moderate to severe pain and inability to complete.    Time 6   Period Weeks   Status New   Target Date 10/18/17               Plan - 09/12/17 1508    Clinical Impression Statement Patient continues to have significant deficits with her  eyes closed in both static and dynamic balance. She has made a substantial improvement in her gait speed, LE strength/power, and dynamic balance with her eyes open. She has also had less shoulder pain throughout much of the day since last session, and seems to be responding well to combination of manual and ER strengthening.    Clinical Presentation Stable   Clinical Decision Making Moderate   Rehab Potential Fair   PT Frequency 2x / week   PT Duration 6 weeks   PT Treatment/Interventions Aquatic Therapy;Moist Heat;Cryotherapy;Electrical Stimulation;Ultrasound;Therapeutic exercise;Therapeutic activities;Gait training;Stair training;Neuromuscular re-education;Manual techniques;Taping;Patient/family education   PT Next Visit Plan Progress dynamic functional balance and generalized strengthening.    PT Home Exercise Plan Supine bridging, mini squats, standing hip abductions with yellow t-bands, side stepping with yellow t-band   Consulted and Agree with Plan of Care Patient      Patient will benefit from skilled therapeutic intervention in order to improve the following deficits and impairments:  Abnormal gait, Decreased activity tolerance, Decreased balance, Decreased endurance, Decreased strength, Difficulty walking, Decreased knowledge of precautions, Decreased mobility, Pain, Decreased range of motion  Visit Diagnosis: Chronic left shoulder pain  Difficulty in walking, not elsewhere classified  Neck pain  Chronic right shoulder pain     Problem List There are no active problems to display for this patient.  Royce Macadamia PT, DPT, CSCS    09/12/2017, 3:12 PM  [facility] [address], [place], [address] Phone: [phone]   Fax:  [phone]  Name: [name] MRN: [id number] Date of Birth: 1930/08/23

## 2017-09-14 ENCOUNTER — Ambulatory Visit: Payer: Medicare Other | Admitting: Physical Therapy

## 2017-09-14 DIAGNOSIS — M25512 Pain in left shoulder: Secondary | ICD-10-CM | POA: Diagnosis not present

## 2017-09-14 DIAGNOSIS — R262 Difficulty in walking, not elsewhere classified: Secondary | ICD-10-CM

## 2017-09-14 DIAGNOSIS — M542 Cervicalgia: Secondary | ICD-10-CM

## 2017-09-14 DIAGNOSIS — G8929 Other chronic pain: Secondary | ICD-10-CM

## 2017-09-14 DIAGNOSIS — M25511 Pain in right shoulder: Secondary | ICD-10-CM

## 2017-09-14 NOTE — Therapy (Signed)
[facility] [address] [place], [address] Phone: [phone]   Fax:  [phone]  Physical Therapy Treatment  Patient Details  Name: [name] MRN: [id number] Date of Birth: 23-May-1930 Referring Provider: Dr. Prince Rome  Encounter Date: 09/14/2017      PT End of Session - 09/14/17 1505    Visit Number 18   Number of Visits 25   Date for PT Re-Evaluation 11/01/17   PT Start Time 1316   PT Stop Time 1355   PT Time Calculation (min) 39 min   Activity Tolerance Patient tolerated treatment well   Behavior During Therapy [facility] for tasks assessed/performed      Past Medical History:  Diagnosis Date  . Hypertension     No past surgical history on file.  There were no vitals filed for this visit.      Subjective Assessment - 09/14/17 1317    Subjective Patient reports her R arm has been feeling much better. She saw her MD on Monday, and discussed her displeasure with her most recent cervical operation.    Pertinent History She had a previous cervical surgery when she was 16, has 3 surgeries in her lumbar spine.    Limitations Walking;House hold activities   Patient Stated Goals To be able to walk her dog around the neighborhood and be able to garden.    Currently in Pain? No/denies      BOSU step ups to blue side up x 10 with bilateral HHA   Tandem gait with eyes closed and HHA x 6 laps of 20' per lap.   Standing hip abductions with 2.5# ankle weight x 12, 4# ankle weight x 12, 7.5# ankle weight x 12  Attempted hip abduction machine which was too uncomfortable on the side of her thigh so dc'd  Single leg stands on BOSU progressing to no use of her UE's x 5 repetitions for 10-15" holds which she could intermittently do without assistance.  Patient ambulated without AD through obstacle course with other therapy staff member crossing her gait path, including turns to make her slow down, no loss of  balance observed on 2 separate bouts                            PT Education - 09/14/17 1504    Education provided Yes   Education Details That she is continuing to make good progress, and appears safe to ambulate short community distances without AD.    Person(s) Educated Patient   Methods Explanation   Comprehension Verbalized understanding             PT Long Term Goals - 09/06/17 1251      PT LONG TERM GOAL #1   Title Patient will be able to demonstrate sit to stand without use of UEs demonstrating improved functional LE strength.    Baseline Unable to stand without use of UEs; 08/08/17: unable to perform without UE support   Time 8   Period Weeks   Status Achieved     PT LONG TERM GOAL #2   Title Patient will demonstrate FGA of at least 12/30 to demonstrate improved dynamic balance to demonstrate improved safety with community mobility.    Baseline 7/30 at initial evaluation; 08/08/17: 10/30   Time 8   Period Weeks   Status On-going     PT LONG TERM GOAL #3   Title Patient  will demonstrate 5x sit to stand and TUG score of less than 17" to demonstrate improved safety with ambulation.    Baseline 08/08/17: TUG: 16.6s; 5TSTS: unable to perform   Time 8   Period Weeks   Status Partially Met     PT LONG TERM GOAL #4   Title Patient will be able to ambulate around her neighborhood with her dog to return to previous level of activity.    Baseline Unable to complete. 08/08/17: pt still unable to perform   Time 8   Period Weeks   Status On-going     PT LONG TERM GOAL #5   Title Patient will report worst pain of no more than 1/10 with shifting gears or lifting her pocket up to demonstrate improved tolerance for ADLs.    Baseline Moderate to severe pain and inability to complete.    Time 6   Period Weeks   Status New   Target Date 10/18/17               Plan - 09/14/17 1505    Clinical Impression Statement Patient is able to navigate  obstacle course without use of UEs quite well, her LE strength has improved tremendously. Her main deficit now is her fear of falling, which limits her gait speed/step length especially when she first stands, however her gait steadiness and speed have improved quite nicely.    Clinical Presentation Stable   Clinical Decision Making Moderate   Rehab Potential Fair   PT Frequency 2x / week   PT Duration 6 weeks   PT Treatment/Interventions Aquatic Therapy;Moist Heat;Cryotherapy;Electrical Stimulation;Ultrasound;Therapeutic exercise;Therapeutic activities;Gait training;Stair training;Neuromuscular re-education;Manual techniques;Taping;Patient/family education   PT Next Visit Plan Progress dynamic functional balance and generalized strengthening.    PT Home Exercise Plan Supine bridging, mini squats, standing hip abductions with yellow t-bands, side stepping with yellow t-band   Consulted and Agree with Plan of Care Patient      Patient will benefit from skilled therapeutic intervention in order to improve the following deficits and impairments:  Abnormal gait, Decreased activity tolerance, Decreased balance, Decreased endurance, Decreased strength, Difficulty walking, Decreased knowledge of precautions, Decreased mobility, Pain, Decreased range of motion  Visit Diagnosis: Chronic left shoulder pain  Difficulty in walking, not elsewhere classified  Neck pain  Chronic right shoulder pain     Problem List There are no active problems to display for this patient.  Royce Macadamia PT, DPT, CSCS    09/14/2017, 3:07 PM  [facility] [address] [place], [address] Phone: [phone]   Fax:  [phone]  Name: [name] MRN: [id number] Date of Birth: 09-12-1930

## 2017-09-14 NOTE — Patient Instructions (Signed)
BOSU step ups to blue side up x 10 with bilateral HHA   Tandem gait with eyes closed and HHA x 6 laps of 20' per lap.   Standing hip abductions with 2.5# ankle weight x 12, 4# ankle weight x 12, 7.5# ankle weight x 12  Attempted hip abduction machine which was too uncomfortable on the side of her thigh so dc'd  Single leg stands on BOSU progressing to no use of her UE's x 5 repetitions for 10-15" holds which she could intermittently do without assistance.

## 2017-09-18 ENCOUNTER — Ambulatory Visit: Payer: Medicare Other | Admitting: Physical Therapy

## 2017-09-18 DIAGNOSIS — M25512 Pain in left shoulder: Principal | ICD-10-CM

## 2017-09-18 DIAGNOSIS — M542 Cervicalgia: Secondary | ICD-10-CM

## 2017-09-18 DIAGNOSIS — G8929 Other chronic pain: Secondary | ICD-10-CM

## 2017-09-18 DIAGNOSIS — M25511 Pain in right shoulder: Secondary | ICD-10-CM

## 2017-09-18 DIAGNOSIS — R262 Difficulty in walking, not elsewhere classified: Secondary | ICD-10-CM

## 2017-09-18 NOTE — Therapy (Signed)
[facility] [address], [place], [address] Phone: [phone]   Fax:  [phone]  Physical Therapy Treatment  Patient Details  Name: [name] MRN: [id number] Date of Birth: 1930/08/05 Referring Provider: Dr. Prince Rome  Encounter Date: 09/18/2017      PT End of Session - 09/18/17 1350    Visit Number 19   Number of Visits 25   Date for PT Re-Evaluation 11/01/17   PT Start Time 2263   PT Stop Time 1348   PT Time Calculation (min) 42 min   Activity Tolerance Patient tolerated treatment well   Behavior During Therapy [facility] for tasks assessed/performed      Past Medical History:  Diagnosis Date  . Hypertension     No past surgical history on file.  There were no vitals filed for this visit.      Subjective Assessment - 09/18/17 1310    Pertinent History She had a previous cervical surgery when she was 38, has 3 surgeries in her lumbar spine.    Limitations Walking;House hold activities   Patient Stated Goals To be able to walk her dog around the neighborhood and be able to garden.    Currently in Pain? Other (Comment)        TUG - 13.84 seconds with no hands and no device - 13.53 seconds 2nd bout   Eyes closed on theraband rocker board (forwards/backwards) and laterally x 5 bouts x 10" holds with cga-min A from therapist in both conditions.   Single leg hip abductions in single leg stance with 1 finger HHA x 15 for 2 sets bilaterally   Total gym single leg squats at level 22 x 10 per side for 3 sets   Total gym squats at level 24 as fast as possible x 6 for 3 sets   Steps with no hands on the way up, 1 HHA on the way down x 6 bouts total, no overt loss of balance though descending was certainly more difficult for her.   Obstacle course stepping over canes/step pylons with no loss of balance                           PT Education - 09/18/17 1350    Education  provided Yes   Education Details Progressing nicely with balance, will continue to work on progressions of eyes closed in clinic.    Person(s) Educated Patient   Methods Explanation   Comprehension Verbalized understanding             PT Long Term Goals - 09/06/17 1251      PT LONG TERM GOAL #1   Title Patient will be able to demonstrate sit to stand without use of UEs demonstrating improved functional LE strength.    Baseline Unable to stand without use of UEs; 08/08/17: unable to perform without UE support   Time 8   Period Weeks   Status Achieved     PT LONG TERM GOAL #2   Title Patient will demonstrate FGA of at least 12/30 to demonstrate improved dynamic balance to demonstrate improved safety with community mobility.    Baseline 7/30 at initial evaluation; 08/08/17: 10/30   Time 8   Period Weeks   Status On-going     PT LONG TERM GOAL #3   Title Patient will demonstrate 5x sit to stand and TUG score of less than 17" to demonstrate improved  safety with ambulation.    Baseline 08/08/17: TUG: 16.6s; 5TSTS: unable to perform   Time 8   Period Weeks   Status Partially Met     PT LONG TERM GOAL #4   Title Patient will be able to ambulate around her neighborhood with her dog to return to previous level of activity.    Baseline Unable to complete. 08/08/17: pt still unable to perform   Time 8   Period Weeks   Status On-going     PT LONG TERM GOAL #5   Title Patient will report worst pain of no more than 1/10 with shifting gears or lifting her pocket up to demonstrate improved tolerance for ADLs.    Baseline Moderate to severe pain and inability to complete.    Time 6   Period Weeks   Status New   Target Date 10/18/17               Plan - 09/18/17 1350    Clinical Impression Statement Patient continues to progress nicely with her dynamic balance while ambulating around clinic. She is able to step over obstacles with no loss of balance, she is still demonstrating  deficits with eyes closed, but her single leg balance continues to progress nicely.    Clinical Presentation Stable   Clinical Decision Making Moderate   Rehab Potential Fair   PT Frequency 2x / week   PT Duration 6 weeks   PT Treatment/Interventions Aquatic Therapy;Moist Heat;Cryotherapy;Electrical Stimulation;Ultrasound;Therapeutic exercise;Therapeutic activities;Gait training;Stair training;Neuromuscular re-education;Manual techniques;Taping;Patient/family education   PT Next Visit Plan Progress dynamic functional balance and generalized strengthening.    PT Home Exercise Plan Supine bridging, mini squats, standing hip abductions with yellow t-bands, side stepping with yellow t-band   Consulted and Agree with Plan of Care Patient      Patient will benefit from skilled therapeutic intervention in order to improve the following deficits and impairments:  Abnormal gait, Decreased activity tolerance, Decreased balance, Decreased endurance, Decreased strength, Difficulty walking, Decreased knowledge of precautions, Decreased mobility, Pain, Decreased range of motion  Visit Diagnosis: Chronic left shoulder pain  Difficulty in walking, not elsewhere classified  Neck pain  Chronic right shoulder pain     Problem List There are no active problems to display for this patient.  Royce Macadamia PT, DPT, CSCS    09/18/2017, 1:52 PM  [facility] [address], [place], [address] Phone: [phone]   Fax:  [phone]  Name: [name] MRN: [id number] Date of Birth: 17-Apr-1930

## 2017-09-18 NOTE — Patient Instructions (Addendum)
TUG - 13.84 seconds with no hands and no device - 13.53 seconds 2nd bout   Eyes closed on theraband rocker board (forwards/backwards) and laterally   Single leg hip abductions in single leg stance with 1 finger HHA x 15 for 2 sets bilaterally   Total gym single leg squats at level 22 x 10 per side for 3 sets   Total gym squats at level 24 as fast as possible x 6 for 3 sets   Steps with no hands on the way up, 1 HHA on the way down   Obstacle course stepping over canes/step pylons with no loss of balance

## 2017-09-21 ENCOUNTER — Ambulatory Visit: Payer: Medicare Other | Admitting: Physical Therapy

## 2017-09-21 ENCOUNTER — Encounter: Payer: Medicare Other | Admitting: Physical Therapy

## 2017-09-21 DIAGNOSIS — M25511 Pain in right shoulder: Secondary | ICD-10-CM

## 2017-09-21 DIAGNOSIS — R262 Difficulty in walking, not elsewhere classified: Secondary | ICD-10-CM

## 2017-09-21 DIAGNOSIS — M25512 Pain in left shoulder: Secondary | ICD-10-CM | POA: Diagnosis not present

## 2017-09-21 DIAGNOSIS — G8929 Other chronic pain: Secondary | ICD-10-CM

## 2017-09-21 DIAGNOSIS — M542 Cervicalgia: Secondary | ICD-10-CM

## 2017-09-21 NOTE — Therapy (Signed)
[facility] [address] [place], [address] Phone: [phone]   Fax:  [phone]  Physical Therapy Treatment  Patient Details  Name: [name] MRN: [id number] Date of Birth: [birth year] Referring Provider: Dr. Prince Rome  Encounter Date: 09/21/2017      PT End of Session - 09/21/17 1320    Visit Number 20   Number of Visits 25   Date for PT Re-Evaluation 11/01/17   PT Start Time 2536   PT Stop Time 1204   PT Time Calculation (min) 41 min   Activity Tolerance Patient tolerated treatment well   Behavior During Therapy [facility] for tasks assessed/performed      Past Medical History:  Diagnosis Date  . Hypertension     No past surgical history on file.  There were no vitals filed for this visit.      Subjective Assessment - 09/21/17 1127    Subjective Patient reports she still has some issues intermittently with neck and shoulder, but both are feeling good today.    Pertinent History She had a previous cervical surgery when she was 42, has 3 surgeries in her lumbar spine.    Limitations Walking;House hold activities   Patient Stated Goals To be able to walk her dog around the neighborhood and be able to garden.    Currently in Pain? No/denies      31mwalk time with no AD - 13.02 seconds  Sit to stand x 7 with 1# DBs, valgus noted with repetition, but not initially   Eyes closed tandem stepping after eyes closed tandem stance x 25" x 6 bouts with cuing for maintaining heel toe, otherwise improved balance and ability to complete from previous sessions.   Standing hip abduction and hip marching on rocker board on lateral then forward orientations x 15 for 2 sets bilaterally with min use of finger for balance assist   Sidestepping on blue foam pad with eyes closed with  Minimal use of finger assist x 2 laps x 3 bouts  Eyes closed stance on blue foam - mild to moderate wobble/sway noted   Lateral  stepping in agility ladder with cuing from therapist for directional changes with 1 finger assist- excellent reactions with no loss of balance noted                           PT Education - 09/21/17 1319    Education provided Yes   Education Details She is making nice progress with balance work and LE strength.    Person(s) Educated Patient   Methods Explanation   Comprehension Verbalized understanding             PT Long Term Goals - 09/06/17 1251      PT LONG TERM GOAL #1   Title Patient will be able to demonstrate sit to stand without use of UEs demonstrating improved functional LE strength.    Baseline Unable to stand without use of UEs; 08/08/17: unable to perform without UE support   Time 8   Period Weeks   Status Achieved     PT LONG TERM GOAL #2   Title Patient will demonstrate FGA of at least 12/30 to demonstrate improved dynamic balance to demonstrate improved safety with community mobility.    Baseline 7/30 at initial evaluation; 08/08/17: 10/30   Time 8   Period Weeks   Status On-going     PT  LONG TERM GOAL #3   Title Patient will demonstrate 5x sit to stand and TUG score of less than 17" to demonstrate improved safety with ambulation.    Baseline 08/08/17: TUG: 16.6s; 5TSTS: unable to perform   Time 8   Period Weeks   Status Partially Met     PT LONG TERM GOAL #4   Title Patient will be able to ambulate around her neighborhood with her dog to return to previous level of activity.    Baseline Unable to complete. 08/08/17: pt still unable to perform   Time 8   Period Weeks   Status On-going     PT LONG TERM GOAL #5   Title Patient will report worst pain of no more than 1/10 with shifting gears or lifting her pocket up to demonstrate improved tolerance for ADLs.    Baseline Moderate to severe pain and inability to complete.    Time 6   Period Weeks   Status New   Target Date 10/18/17               Plan - 09/21/17 1320     Clinical Impression Statement Patient has made excellent progress with her balance todate, she is now able to complete tandem gait with minimal deficits, ambulate in and out of clinic safely with no AD. She consistently performs weighted sit to stands with minimal valgus. She is progressing towards all goals nicely.    Clinical Presentation Stable   Clinical Decision Making Moderate   Rehab Potential Fair   PT Frequency 2x / week   PT Duration 6 weeks   PT Treatment/Interventions Aquatic Therapy;Moist Heat;Cryotherapy;Electrical Stimulation;Ultrasound;Therapeutic exercise;Therapeutic activities;Gait training;Stair training;Neuromuscular re-education;Manual techniques;Taping;Patient/family education   PT Next Visit Plan Progress dynamic functional balance and generalized strengthening.    PT Home Exercise Plan Supine bridging, mini squats, standing hip abductions with yellow t-bands, side stepping with yellow t-band   Consulted and Agree with Plan of Care Patient      Patient will benefit from skilled therapeutic intervention in order to improve the following deficits and impairments:  Abnormal gait, Decreased activity tolerance, Decreased balance, Decreased endurance, Decreased strength, Difficulty walking, Decreased knowledge of precautions, Decreased mobility, Pain, Decreased range of motion  Visit Diagnosis: Chronic left shoulder pain  Difficulty in walking, not elsewhere classified  Neck pain  Chronic right shoulder pain     Problem List There are no active problems to display for this patient.  Royce Macadamia PT, DPT, CSCS    09/21/2017, 1:23 PM  [facility] [address], [place], [address] Phone: [phone]   Fax:  [phone]  Name: [name] MRN: [id number] Date of Birth: 17-Feb-1930

## 2017-09-21 NOTE — Patient Instructions (Signed)
36m walk time with no AD - 13.02 seconds  Sit to stand x 7 with 1# DBs, valgus noted with repetition, but not initially   Eyes closed tandem stepping after eyes closed tandem stance   Standing hip abduction and hip marching on rocker board  Sidestepping on blue foam pad with eyes closed with   Eyes closed stance on blue foam - mild to moderate wobble/sway noted

## 2017-09-25 ENCOUNTER — Ambulatory Visit: Payer: Medicare Other | Admitting: Physical Therapy

## 2017-09-25 DIAGNOSIS — M25512 Pain in left shoulder: Secondary | ICD-10-CM | POA: Diagnosis not present

## 2017-09-25 DIAGNOSIS — M542 Cervicalgia: Secondary | ICD-10-CM

## 2017-09-25 DIAGNOSIS — M25511 Pain in right shoulder: Secondary | ICD-10-CM

## 2017-09-25 DIAGNOSIS — R262 Difficulty in walking, not elsewhere classified: Secondary | ICD-10-CM

## 2017-09-25 DIAGNOSIS — G8929 Other chronic pain: Secondary | ICD-10-CM

## 2017-09-25 NOTE — Therapy (Signed)
[facility] [address], [place], [address] Phone: [phone]   Fax:  [phone]  Physical Therapy Treatment  Patient Details  Name: [name] MRN: [id number] Date of Birth: September 09, 1930 Referring Provider: Dr. Prince Rome  Encounter Date: 09/25/2017      PT End of Session - 09/25/17 1316    Visit Number 21   Number of Visits 25   Date for PT Re-Evaluation 11/01/17   PT Start Time 1314   PT Stop Time 1345   PT Time Calculation (min) 31 min   Activity Tolerance Patient tolerated treatment well   Behavior During Therapy [facility] for tasks assessed/performed      Past Medical History:  Diagnosis Date  . Hypertension     No past surgical history on file.  There were no vitals filed for this visit.      Subjective Assessment - 09/25/17 1316    Subjective Patient reports she is getting some pain down to [name] and below, she will be seeing an orthopedist soon to further examine this.   Pertinent History She had a previous cervical surgery when she was 43, has 3 surgeries in her lumbar spine.    Limitations Walking;House hold activities   Patient Stated Goals To be able to walk her dog around the neighborhood and be able to garden.    Currently in Pain? Other (Comment)  Some pain/tightness noted in L upper trap area      Patient points to her L upper trap, reports she gets some pain that goes down to her elbow   Soft tissue mobilization to L upper trap x 12 minutes, patient reported relief of some of the pain and tightness she was experiencing in this area after completion.   Sit to stands (noted to be collapsing into valgus and falling backward) added red t-band around her knees performed x 12 total, multiple attempts where she fell backwards secondary to not appropriately shifting weight anteriorly onto her toes.   Side stepping with red t-bands x 2 laps for 2 bouts -- began to fatigue  appropriately in her hips  Leg Press 55# x 20 repetitions, 75# x 15, 85# x (from the furthest seat position)                            PT Education - 09/25/17 1316    Education provided Yes   Education Details Discussed POC as well as her progress thus far.    Person(s) Educated Patient   Methods Explanation;Demonstration   Comprehension Returned demonstration;Verbalized understanding             PT Long Term Goals - 09/06/17 1251      PT LONG TERM GOAL #1   Title Patient will be able to demonstrate sit to stand without use of UEs demonstrating improved functional LE strength.    Baseline Unable to stand without use of UEs; 08/08/17: unable to perform without UE support   Time 8   Period Weeks   Status Achieved     PT LONG TERM GOAL #2   Title Patient will demonstrate FGA of at least 12/30 to demonstrate improved dynamic balance to demonstrate improved safety with community mobility.    Baseline 7/30 at initial evaluation; 08/08/17: 10/30   Time 8   Period Weeks   Status On-going     PT LONG TERM GOAL #3   Title  Patient will demonstrate 5x sit to stand and TUG score of less than 17" to demonstrate improved safety with ambulation.    Baseline 08/08/17: TUG: 16.6s; 5TSTS: unable to perform   Time 8   Period Weeks   Status Partially Met     PT LONG TERM GOAL #4   Title Patient will be able to ambulate around her neighborhood with her dog to return to previous level of activity.    Baseline Unable to complete. 08/08/17: pt still unable to perform   Time 8   Period Weeks   Status On-going     PT LONG TERM GOAL #5   Title Patient will report worst pain of no more than 1/10 with shifting gears or lifting her pocket up to demonstrate improved tolerance for ADLs.    Baseline Moderate to severe pain and inability to complete.    Time 6   Period Weeks   Status New   Target Date 10/18/17               Plan - 09/25/17 1317    Clinical  Impression Statement Patient reponded well to manual therapy provided in this session, with reduced pain and stiffness in her L UT area. She will be seeing an orthopedist, though would benefit from more thorough PT screening as her R arm has seemed to respond well to soft tissue work and ER strengthening.    Clinical Presentation Stable   Clinical Decision Making Moderate   Rehab Potential Fair   PT Frequency 2x / week   PT Duration 6 weeks   PT Treatment/Interventions Aquatic Therapy;Moist Heat;Cryotherapy;Electrical Stimulation;Ultrasound;Therapeutic exercise;Therapeutic activities;Gait training;Stair training;Neuromuscular re-education;Manual techniques;Taping;Patient/family education   PT Next Visit Plan Progress dynamic functional balance and generalized strengthening.    PT Home Exercise Plan Supine bridging, mini squats, standing hip abductions with yellow t-bands, side stepping with yellow t-band   Consulted and Agree with Plan of Care Patient      Patient will benefit from skilled therapeutic intervention in order to improve the following deficits and impairments:  Abnormal gait, Decreased activity tolerance, Decreased balance, Decreased endurance, Decreased strength, Difficulty walking, Decreased knowledge of precautions, Decreased mobility, Pain, Decreased range of motion  Visit Diagnosis: Chronic left shoulder pain  Difficulty in walking, not elsewhere classified  Neck pain  Chronic right shoulder pain     Problem List There are no active problems to display for this patient.  Royce Macadamia PT, DPT, CSCS    09/25/2017, 1:50 PM  [facility] [address] [place], [address] Phone: [phone]   Fax:  [phone]  Name: [name] MRN: [id number] Date of Birth: Dec 21, 1929

## 2017-09-25 NOTE — Patient Instructions (Signed)
Patient points to her L upper trap, reports she gets some pain that goes down to her elbow   Soft tissue mobilization to L upper trap   Sit to stands (noted to be collapsing into valgus and falling backward) added red t-band around her knees   Side stepping with red t-bands x 2 laps for 2 bouts -- began to fatigue appropriately in her hips  Leg Press 55# x 20 repetitions, 75# x 15, 85# x (from the furthest seat position)

## 2017-09-28 ENCOUNTER — Ambulatory Visit: Payer: Medicare Other | Admitting: Physical Therapy

## 2017-09-28 DIAGNOSIS — R262 Difficulty in walking, not elsewhere classified: Secondary | ICD-10-CM

## 2017-09-28 DIAGNOSIS — M25512 Pain in left shoulder: Secondary | ICD-10-CM | POA: Diagnosis not present

## 2017-09-28 DIAGNOSIS — G8929 Other chronic pain: Secondary | ICD-10-CM

## 2017-09-28 NOTE — Therapy (Signed)
[facility] [address], [place], [address] Phone: [phone]   Fax:  [phone]  Physical Therapy Treatment  Patient Details  Name: [name] MRN: [id number] Date of Birth: [birth year] Referring Provider: Dr. Prince Rome  Encounter Date: 09/28/2017      PT End of Session - 09/28/17 1411    Visit Number 22   Number of Visits 25   Date for PT Re-Evaluation 11/01/17   PT Start Time 1304   PT Stop Time 1350   PT Time Calculation (min) 46 min   Equipment Utilized During Treatment Gait belt   Activity Tolerance Patient tolerated treatment well   Behavior During Therapy [facility] for tasks assessed/performed      Past Medical History:  Diagnosis Date  . Hypertension     No past surgical history on file.  There were no vitals filed for this visit.      Subjective Assessment - 09/28/17 1357    Subjective [name] reports that she still has some difficulty getting from a seated position to standing.  She reports that she hasn't purchased a foam pad yet to practice balance ex's at home.   Pain Score 0-No pain                         [facility] Adult PT Treatment/Exercise - 09/28/17 0001      Neuro Re-ed    Neuro Re-ed Details  Eyes open and Eyes closed; single leg stance on floor and on foam; Tandem stance and tandem gait with finger hold; narrow BOS on foam with eyes closed     Exercises   Exercises Knee/Hip     Knee/Hip Exercises: Machines for Strengthening   Total Gym Leg Press 55# 20 reps; 75# 15 reps; 85# 5 reps     Knee/Hip Exercises: Seated   Other Seated Knee/Hip Exercises sit to stands from green chair 3x10 reps with red t-band around thighs and no use of UE's     Knee/Hip Exercises: Supine   Bridges 3 sets;10 reps  with TA squeeze and single leg extension                PT Education - 09/28/17 1407    Education provided Yes   Education Details Educated pt on  strengthening of hip extensors through bridging ex at home for improving sit to stand strength   Person(s) Educated Patient   Methods Explanation   Comprehension Returned demonstration;Verbalized understanding             PT Long Term Goals - 09/28/17 1416      PT LONG TERM GOAL #1   Title Patient will be able to demonstrate sit to stand without use of UEs demonstrating improved functional LE strength.    Baseline Unable to stand without use of UEs; 08/08/17: unable to perform without UE support   Time 8   Period Weeks   Status Achieved     PT LONG TERM GOAL #2   Title Patient will demonstrate FGA of at least 12/30 to demonstrate improved dynamic balance to demonstrate improved safety with community mobility.    Baseline 7/30 at initial evaluation; 08/08/17: 10/30   Time 8   Period Weeks     PT LONG TERM GOAL #3   Title Patient will demonstrate 5x sit to stand and TUG score of less than 17" to demonstrate improved safety with ambulation.    Baseline  08/08/17: TUG: 16.6s; 5TSTS: unable to perform   Period Weeks   Status Partially Met     PT LONG TERM GOAL #4   Title Patient will be able to ambulate around her neighborhood with her dog to return to previous level of activity.    Baseline Unable to complete. 08/08/17: pt still unable to perform   Time 8   Period Weeks   Status On-going     PT LONG TERM GOAL #5   Title Patient will report worst pain of no more than 1/10 with shifting gears or lifting her pocket up to demonstrate improved tolerance for ADLs.    Baseline Moderate to severe pain and inability to complete.    Time 6   Period Weeks   Status New               Plan - 09/28/17 1414    Clinical Impression Statement Pt responded well to NMR training today.  She was able to verbalize instructions without prompting for proper technique with sit-to-stands ex from previous visit.   Clinical Presentation Stable   Clinical Decision Making Moderate   Rehab  Potential Fair   PT Frequency 2x / week   PT Duration 6 weeks   PT Treatment/Interventions Aquatic Therapy;Moist Heat;Cryotherapy;Electrical Stimulation;Ultrasound;Therapeutic exercise;Therapeutic activities;Gait training;Stair training;Neuromuscular re-education;Manual techniques;Taping;Patient/family education   PT Next Visit Plan Progress dynamic functional balance and generalized strengthening.    PT Home Exercise Plan Supine bridging, mini squats, standing hip abductions with yellow t-bands, side stepping with yellow t-band   Consulted and Agree with Plan of Care Patient      Patient will benefit from skilled therapeutic intervention in order to improve the following deficits and impairments:  Abnormal gait, Decreased activity tolerance, Decreased balance, Decreased endurance, Decreased strength, Difficulty walking, Decreased knowledge of precautions, Decreased mobility, Pain, Decreased range of motion  Visit Diagnosis: Difficulty in walking, not elsewhere classified  Chronic left shoulder pain     Problem List There are no active problems to display for this patient.   [name], MPT 09/28/2017, 2:20 PM  [facility] [address] [place], [address] Phone: [phone]   Fax:  [phone]  Name: [name] MRN: [id number] Date of Birth: [birth year]

## 2017-10-02 ENCOUNTER — Ambulatory Visit: Payer: Medicare Other | Admitting: Physical Therapy

## 2017-10-02 ENCOUNTER — Encounter: Payer: Self-pay | Admitting: Physical Therapy

## 2017-10-02 DIAGNOSIS — G8929 Other chronic pain: Secondary | ICD-10-CM

## 2017-10-02 DIAGNOSIS — M25512 Pain in left shoulder: Secondary | ICD-10-CM

## 2017-10-02 DIAGNOSIS — R262 Difficulty in walking, not elsewhere classified: Secondary | ICD-10-CM

## 2017-10-02 NOTE — Therapy (Signed)
[facility] [address] [place], [address] Phone: [phone]   Fax:  [phone]  Physical Therapy Treatment  Patient Details  Name: [name] MRN: [id number] Date of Birth: [birth year] Referring Provider: Dr. Prince Rome  Encounter Date: 10/02/2017      PT End of Session - 10/02/17 1355    Visit Number 23   Number of Visits 25   Date for PT Re-Evaluation 11/01/17   Authorization Type 8/10   PT Start Time 1355   PT Stop Time 1445   PT Time Calculation (min) 50 min   Equipment Utilized During Treatment Gait belt   Activity Tolerance Patient tolerated treatment well   Behavior During Therapy [facility] for tasks assessed/performed      Past Medical History:  Diagnosis Date  . Hypertension     History reviewed. No pertinent surgical history.  There were no vitals filed for this visit.      Subjective Assessment - 10/02/17 1358    Subjective Pt reports her R shoulder is bothering her more than usual today.  She has been told that it is a bursitis.  Pt had an MD appointment this morning where her MD suggested having a CT done on her R shoulder but pt refused as she does not want to have shoulder surgery.  Pt is upset that she could not have another injection in her R shoulder as it has not been 3 months since her last injection.  She reports her LUE is feeling better since STM during therapy and with use of heat at home.  She had an injection in her L shoulder this morning at her appointment.   Currently in Pain? Yes   Pain Score 9   at it's worst   Pain Location Shoulder   Pain Orientation Right   Pain Descriptors / Indicators Aching   Pain Type Chronic pain   Pain Onset More than a month ago      TREATMENT   Neuro Re-ed:   Marching on airex pad x20 each LE   Rhomberg stance with eyes closed 2x30 seconds with occasional min assist   Rhomberg stance with eyes open with ball toss inside and  outside BOS x1 minute. Pt requires occasional min assist due to instability. Pt reports pain in R shoulder so discontinued.   SLS with opposite foot on pink ball rolling it forward and back. Repeated on other extremity. 60 seconds each side.   SLS while tapping opposite foot forward, to the side, and back. x1 minute each LE.       Therapeutic Exercise:   Total gym leg press. 65# x10, 75# x10, 85# x10 with cues to avoid Bil knee valgus   Sit to stands from chair 3x10 reps with red t-band around thighs and no use of UE's. Max verbal and tactile cues to avoid knee valgus for greater glute recruitment. Occasional min assist due to LOB.   Side stepping with cues for slight knee flexion Bil for greater glute recruitment x8 lengths at treadmill             PT Education - 10/02/17 1355    Education provided Yes   Education Details Exercise technique   Person(s) Educated Patient   Methods Explanation;Demonstration;Verbal cues   Comprehension Verbalized understanding;Returned demonstration;Verbal cues required;Need further instruction             PT Long Term Goals - 09/28/17 1416  PT LONG TERM GOAL #1   Title Patient will be able to demonstrate sit to stand without use of UEs demonstrating improved functional LE strength.    Baseline Unable to stand without use of UEs; 08/08/17: unable to perform without UE support   Time 8   Period Weeks   Status Achieved     PT LONG TERM GOAL #2   Title Patient will demonstrate FGA of at least 12/30 to demonstrate improved dynamic balance to demonstrate improved safety with community mobility.    Baseline 7/30 at initial evaluation; 08/08/17: 10/30   Time 8   Period Weeks     PT LONG TERM GOAL #3   Title Patient will demonstrate 5x sit to stand and TUG score of less than 17" to demonstrate improved safety with ambulation.    Baseline 08/08/17: TUG: 16.6s; 5TSTS: unable to perform   Period Weeks   Status Partially Met     PT  LONG TERM GOAL #4   Title Patient will be able to ambulate around her neighborhood with her dog to return to previous level of activity.    Baseline Unable to complete. 08/08/17: pt still unable to perform   Time 8   Period Weeks   Status On-going     PT LONG TERM GOAL #5   Title Patient will report worst pain of no more than 1/10 with shifting gears or lifting her pocket up to demonstrate improved tolerance for ADLs.    Baseline Moderate to severe pain and inability to complete.    Time 6   Period Weeks   Status New               Plan - 10/02/17 1448    Clinical Impression Statement Pt fatigues quickly with strengthening exercises and requires several seated rest breaks throughout session.  Pt completed a combination of strengthening and balance exercises and toleratd all well.  She has significant difficulty with SLS activities (L more challenging than R).  She will benefit from continued skilled PT interventions for improved strength, balance, and QOL.    Rehab Potential Fair   PT Frequency 2x / week   PT Duration 6 weeks   PT Treatment/Interventions Aquatic Therapy;Moist Heat;Cryotherapy;Electrical Stimulation;Ultrasound;Therapeutic exercise;Therapeutic activities;Gait training;Stair training;Neuromuscular re-education;Manual techniques;Taping;Patient/family education   PT Next Visit Plan Progress dynamic functional balance and generalized strengthening.    PT Home Exercise Plan Supine bridging, mini squats, standing hip abductions with yellow t-bands, side stepping with yellow t-band   Consulted and Agree with Plan of Care Patient      Patient will benefit from skilled therapeutic intervention in order to improve the following deficits and impairments:  Abnormal gait, Decreased activity tolerance, Decreased balance, Decreased endurance, Decreased strength, Difficulty walking, Decreased knowledge of precautions, Decreased mobility, Pain, Decreased range of motion  Visit  Diagnosis: Difficulty in walking, not elsewhere classified  Chronic left shoulder pain     Problem List There are no active problems to display for this patient.   Collie Siad PT, DPT 10/02/2017, 2:49 PM  [facility] [address] [place], [address] Phone: [phone]   Fax:  [phone]  Name: [name] MRN: [id number] Date of Birth: 1930/05/30

## 2017-10-05 ENCOUNTER — Ambulatory Visit: Payer: Medicare Other | Admitting: Physical Therapy

## 2017-10-09 ENCOUNTER — Encounter: Payer: Medicare Other | Admitting: Physical Therapy

## 2017-10-09 ENCOUNTER — Encounter (INDEPENDENT_AMBULATORY_CARE_PROVIDER_SITE_OTHER): Payer: Self-pay | Admitting: Vascular Surgery

## 2017-10-09 ENCOUNTER — Ambulatory Visit (INDEPENDENT_AMBULATORY_CARE_PROVIDER_SITE_OTHER): Payer: Medicare Other | Admitting: Vascular Surgery

## 2017-10-09 DIAGNOSIS — I872 Venous insufficiency (chronic) (peripheral): Secondary | ICD-10-CM | POA: Diagnosis not present

## 2017-10-09 DIAGNOSIS — M79605 Pain in left leg: Secondary | ICD-10-CM

## 2017-10-09 DIAGNOSIS — I10 Essential (primary) hypertension: Secondary | ICD-10-CM | POA: Diagnosis not present

## 2017-10-09 DIAGNOSIS — M79604 Pain in right leg: Secondary | ICD-10-CM

## 2017-10-09 DIAGNOSIS — M7989 Other specified soft tissue disorders: Secondary | ICD-10-CM | POA: Diagnosis not present

## 2017-10-09 DIAGNOSIS — I890 Lymphedema, not elsewhere classified: Secondary | ICD-10-CM | POA: Diagnosis not present

## 2017-10-09 DIAGNOSIS — J449 Chronic obstructive pulmonary disease, unspecified: Secondary | ICD-10-CM

## 2017-10-10 ENCOUNTER — Ambulatory Visit: Payer: Medicare Other | Admitting: Physical Therapy

## 2017-10-10 DIAGNOSIS — M25512 Pain in left shoulder: Secondary | ICD-10-CM | POA: Diagnosis not present

## 2017-10-10 DIAGNOSIS — M25511 Pain in right shoulder: Secondary | ICD-10-CM

## 2017-10-10 DIAGNOSIS — M542 Cervicalgia: Secondary | ICD-10-CM

## 2017-10-10 DIAGNOSIS — R262 Difficulty in walking, not elsewhere classified: Secondary | ICD-10-CM

## 2017-10-10 DIAGNOSIS — G8929 Other chronic pain: Secondary | ICD-10-CM

## 2017-10-10 NOTE — Patient Instructions (Signed)
Supine ER MMT from 70 degrees abduction neutral ER-- mild pain, same with IR but 4+/5 strength. No pain, no weakness with abduction.  Patient reports pain is up by upper trapezius

## 2017-10-11 DIAGNOSIS — I890 Lymphedema, not elsewhere classified: Secondary | ICD-10-CM | POA: Insufficient documentation

## 2017-10-11 DIAGNOSIS — M7989 Other specified soft tissue disorders: Secondary | ICD-10-CM | POA: Insufficient documentation

## 2017-10-11 DIAGNOSIS — I10 Essential (primary) hypertension: Secondary | ICD-10-CM | POA: Insufficient documentation

## 2017-10-11 DIAGNOSIS — J449 Chronic obstructive pulmonary disease, unspecified: Secondary | ICD-10-CM | POA: Insufficient documentation

## 2017-10-11 DIAGNOSIS — I872 Venous insufficiency (chronic) (peripheral): Secondary | ICD-10-CM | POA: Insufficient documentation

## 2017-10-11 DIAGNOSIS — M79606 Pain in leg, unspecified: Secondary | ICD-10-CM | POA: Insufficient documentation

## 2017-10-11 NOTE — Progress Notes (Signed)
MRN : [id number]  [name] is a 83 y.o. ([birth year]) female who presents with chief complaint of  Chief Complaint  Patient presents with  . New Patient (Initial Visit)    Venous insuffiencey  .  History of Present Illness: Patient is seen for evaluation of leg pain and leg swelling. The patient first noticed the swelling remotely. The swelling is associated with pain and discoloration. The pain and swelling worsens with prolonged dependency and improves with elevation. The pain is unrelated to activity.  The patient notes that in the morning the legs are significantly improved but they steadily worsened throughout the course of the day. The patient also notes a steady worsening of the discoloration in the ankle and shin area.   The patient denies claudication symptoms.  The patient denies symptoms consistent with rest pain.  The patient denies and extensive history of DJD and LS spine disease.  The patient has no had any past angiography, interventions or vascular surgery.  Elevation makes the leg symptoms better, dependency makes them much worse. There is no history of ulcerations. The patient denies any recent changes in medications.  The patient has not been wearing graduated compression.  The patient denies a history of DVT or PE. There is no prior history of phlebitis. There is no history of primary lymphedema.  No history of malignancies. No history of trauma or groin or pelvic surgery. There is no history of radiation treatment to the groin or pelvis  The patient denies amaurosis fugax or recent TIA symptoms. There are no recent neurological changes noted. The patient denies recent episodes of angina or shortness of breath  Current Meds  Medication Sig  . acetaminophen (TYLENOL) 650 MG CR tablet Take 1,300 mg by mouth 2 (two) times daily.  Marland Kitchen amLODipine (NORVASC) 2.5 MG tablet Take 2.5 mg by mouth daily.  . Azelaic Acid (FINACEA) 15 % cream Apply 1 application topically  every other day. After skin is thoroughly washed and patted dry, gently but thoroughly massage a thin film of azelaic acid cream into the affected area twice daily, in the morning and evening.  . benzocaine-menthol (CHLORAEPTIC) 6-10 MG lozenge Take by mouth.  . busPIRone (BUSPAR) 10 MG tablet Take 10 mg by mouth.  . Calcium Carbonate-Vitamin D (CALCIUM-D) 600-400 MG-UNIT TABS Take 1 tablet by mouth 2 (two) times daily.  . Calcium Polycarbophil (FIBER-CAPS PO) Take 3 tablets by mouth daily.  . Cholecalciferol (VITAMIN D-3) 5000 units TABS Take 5,000 Units by mouth daily.  . cyanocobalamin (,VITAMIN B-12,) 1000 MCG/ML injection Inject 1,000 mcg into the muscle every 30 (thirty) days.  . diclofenac sodium (VOLTAREN) 1 % GEL Apply 2 g topically as needed.  Marland Kitchen estradiol (ESTRACE) 0.1 MG/GM vaginal cream Place 1 Applicatorful vaginally 3 (three) times a week. MON, WED, FRI  . folic acid (FOLVITE) 1 MG tablet Take 2 mg by mouth daily.  Marland Kitchen gabapentin (NEURONTIN) 100 MG capsule Take 200 mg by mouth.  . Glucosamine Sulfate 1000 MG TABS Take 1 tablet by mouth daily.  . Homeopathic Products (CALM PLUS SL) Place 1 tablet under the tongue as needed.  . hydroxychloroquine (PLAQUENIL) 200 MG tablet Take 200 mg by mouth daily.  Marland Kitchen ipratropium (ATROVENT) 0.06 % nasal spray Place 2 sprays into both nostrils daily as needed for rhinitis.  Marland Kitchen lidocaine (LIDODERM) 5 % Place 1 patch onto the skin daily as needed. Remove & Discard patch within 12 hours or as directed by MD  . methotrexate  50 MG/2ML injection   . Methotrexate, PF, 25 MG/0.4ML SOAJ Inject 0.4 mLs into the skin once a week. On Saturday  . Multiple Vitamin (MULTIVITAMIN) tablet Take 1 tablet by mouth daily.  . nebivolol (BYSTOLIC) 5 MG tablet Take 5 mg by mouth daily.  Marland Kitchen omega-3 acid ethyl esters (LOVAZA) 1 g capsule Take by mouth 2 (two) times daily.  . Omega-3 Fatty Acids (FISH OIL PO) Take by mouth.  Marland Kitchen omeprazole (PRILOSEC) 40 MG capsule Take 40 mg by  mouth daily as needed.  . predniSONE (DELTASONE) 2.5 MG tablet Take 2.5 mg by mouth daily with breakfast.  . Probiotic Product (PHILLIPS COLON HEALTH PO) Take 1 tablet by mouth daily.  . propranolol ER (INDERAL LA) 60 MG 24 hr capsule Take by mouth.  . senna-docusate (SENOKOT-S) 8.6-50 MG tablet Take by mouth.  . sertraline (ZOLOFT) 50 MG tablet   . Tretinoin (RETIN-A EX) Apply 1 application topically every other day.  . [DISCONTINUED] Cyanocobalamin 1000 MCG LOZG Inject 1,000 mcg as directed monthly.    Past Medical History:  Diagnosis Date  . Anemia   . GERD (gastroesophageal reflux disease)   . Hypertension   . Syncope     No past surgical history on file.  Social History Social History  Substance Use Topics  . Smoking status: Never Smoker  . Smokeless tobacco: Never Used  . Alcohol use Yes     Comment: glass of wine    Family History Family History  Problem Relation Age of Onset  . Breast cancer Mother 72  No family history of bleeding/clotting disorders, porphyria or autoimmune disease   Allergies  Allergen Reactions  . Amitriptyline     Other reaction(s): Other (See Comments) Caused nervousness  . Ciprofloxacin Diarrhea  . Clarithromycin   . [facility] Allergy]   . Codeine Nausea Only  . Doxycycline     Flu-like s/s  . Itraconazole Other (See Comments)    Elevated liver enzymes  . Morphine     Other reaction(s): Unknown  . Nsaids     Other reaction(s): Other (See Comments)  . Other     Bioxin-upset stomach  . Tramadol     Other reaction(s): Unknown Other reaction(s): Unknown  . Adhesive [Tape] Rash  . Erythromycin Rash  . Penicillins Rash     REVIEW OF SYSTEMS (Negative unless checked)  Constitutional: [] Weight loss  [] Fever  [] Chills Cardiac: [] Chest pain   [] Chest pressure   [] Palpitations   [] Shortness of breath when laying flat   [] Shortness of breath with exertion. Vascular:  [] Pain in legs with walking   [x] Pain in legs at rest  [] History  of DVT   [] Phlebitis   [x] Swelling in legs   [] Varicose veins   [] Non-healing ulcers Pulmonary:   [] Uses home oxygen   [] Productive cough   [] Hemoptysis   [] Wheeze  [] COPD   [] Asthma Neurologic:  [] Dizziness   [] Seizures   [] History of stroke   [] History of TIA  [] Aphasia   [] Vissual changes   [] Weakness or numbness in arm   [] Weakness or numbness in leg Musculoskeletal:   [] Joint swelling   [] Joint pain   [] Low back pain Hematologic:  [] Easy bruising  [] Easy bleeding   [] Hypercoagulable state   [] Anemic Gastrointestinal:  [] Diarrhea   [] Vomiting  [] Gastroesophageal reflux/heartburn   [] Difficulty swallowing. Genitourinary:  [] Chronic kidney disease   [] Difficult urination  [] Frequent urination   [] Blood in urine Skin:  [] Rashes   [] Ulcers  Psychological:  [] History of anxiety   []   History of major depression.  Physical Examination  Vitals:   10/09/17 1323  BP: 132/61  Pulse: 66  Resp: 14  Weight: 128 lb (58.1 kg)  Height: 5' (1.524 m)   Body mass index is 25 kg/m. Gen: WD/WN, NAD Head: [place]/AT, No temporalis wasting.  Ear/Nose/Throat: Hearing grossly intact, nares w/o erythema or drainage, poor dentition Eyes: PER, EOMI, sclera nonicteric.  Neck: Supple, no masses.  No bruit or JVD.  Pulmonary:  Good air movement, clear to auscultation bilaterally, no use of accessory muscles.  Cardiac: RRR, normal S1, S2, no Murmurs. Vascular: scattered varicosities present bilaterally.  Mild venous stasis changes to the legs bilaterally.  2+ soft pitting edema Vessel Right Left  Radial Palpable Palpable  PT Palpable Palpable  DP Palpable Palpable  Gastrointestinal: soft, non-distended. No guarding/no peritoneal signs.  Musculoskeletal: M/S 5/5 throughout.  No deformity or atrophy.  Neurologic: CN 2-12 intact. Pain and light touch intact in extremities.  Symmetrical.  Speech is fluent. Motor exam as listed above. Psychiatric: Judgment intact, Mood & affect appropriate for pt's clinical  situation. Dermatologic: venous rashes no ulcers noted.  No changes consistent with cellulitis. Lymph : No Cervical lymphadenopathy, no lichenification or skin changes of chronic lymphedema.  CBC No results found for: WBC, HGB, HCT, MCV, PLT  BMET No results found for: NA, K, CL, CO2, GLUCOSE, BUN, CREATININE, CALCIUM, GFRNONAA, GFRAA CrCl cannot be calculated (No order found.).  COAG No results found for: INR, PROTIME  Radiology No results found.  Assessment/Plan 1. Lymphedema No surgery or intervention at this point in time.  I have reviewed my discussion with the patient regarding venous insufficiency and why it causes symptoms. I have discussed with the patient the chronic skin changes that accompany venous insufficiency and the long term sequela such as ulceration. Patient will contnue wearing graduated compression stockings on a daily basis, as this has provided excellent control of his edema. The patient will put the stockings on first thing in the morning and removing them in the evening. The patient is reminded not to sleep in the stockings.  In addition, behavioral modification including elevation during the day will be initiated. Exercise is strongly encouraged.  Given the patient's good control and lack of any problems regarding the venous insufficiency and lymphedema a lymph pump in not need at this time.  The patient will follow up with me PRN should anything change.  The patient voices agreement with this plan.   2. Chronic venous insufficiency No surgery or intervention at this point in time.    I have had a long discussion with the patient regarding venous insufficiency and why it  causes symptoms. I have discussed with the patient the chronic skin changes that accompany venous insufficiency and the long term sequela such as infection and ulceration.  Patient will begin wearing graduated compression stockings class 1 (20-30 mmHg) or compression wraps on a daily basis  a prescription was given. The patient will put the stockings on first thing in the morning and removing them in the evening. The patient is instructed specifically not to sleep in the stockings.    In addition, behavioral modification including several periods of elevation of the lower extremities during the day will be continued. I have demonstrated that proper elevation is a position with the ankles at heart level.  The patient is instructed to begin routine exercise, especially walking on a daily basis  Following the review of the ultrasound the patient will follow up in 2-3  months to reassess the degree of swelling and the control that graduated compression stockings or compression wraps  is offering.   The patient can be assessed for a Lymph Pump at that time  3. Pain in both lower extremities Multifactorial with a small contribution from her venous disease/lymphedema  4. Leg swelling See #1&2  5. Chronic obstructive pulmonary disease, unspecified COPD type (HCC) Continue pulmonary medications and aerosols as already ordered, these medications have been reviewed and there are no changes at this time.    6. Essential hypertension Continue antihypertensive medications as already ordered, these medications have been reviewed and there are no changes at this time.     Levora DredgeGregory Sherral Dirocco, MD  10/11/2017 12:08 PM

## 2017-10-11 NOTE — Therapy (Signed)
[facility] [address] [place], [address] Phone: [phone]   Fax:  [phone]  Physical Therapy Treatment  Patient Details  Name: [name] MRN: [id number] Date of Birth: Nov 21, 1930 Referring Provider: Dr. Prince Rome  Encounter Date: 10/10/2017      PT End of Session - 10/11/17 0855    Visit Number 24   Number of Visits 25   Date for PT Re-Evaluation 11/01/17   Authorization Type 9/10   PT Start Time 1535   PT Stop Time 1605   PT Time Calculation (min) 30 min   Equipment Utilized During Treatment Gait belt   Activity Tolerance Patient tolerated treatment well   Behavior During Therapy [facility] for tasks assessed/performed      Past Medical History:  Diagnosis Date  . Anemia   . GERD (gastroesophageal reflux disease)   . Hypertension   . Syncope     No past surgical history on file.  There were no vitals filed for this visit.      Subjective Assessment - 10/10/17 1544    Subjective Patient reports her R shoulder continues to bother her, she does not want surgery but wants to have less pain. She discusses her options of injections vs. other conservative management. She has noticed a significant improvement in her balance since starting therapyt.    Pertinent History She had a previous cervical surgery when she was 38, has 3 surgeries in her lumbar spine.    Limitations Walking;House hold activities   Patient Stated Goals To be able to walk her dog around the neighborhood and be able to garden.    Currently in Pain? Yes   Pain Score --  She does not rate, but indicates mild to moderate pain around upper trapezius and blending into cervical musculature on RUE.   Pain Location Shoulder   Pain Orientation Right   Pain Descriptors / Indicators Aching   Pain Type Chronic pain   Pain Onset More than a month ago   Pain Frequency Intermittent      Supine ER MMT from 70 degrees abduction neutral  ER-- mild pain, same with IR but 4+/5 strength. No pain, no weakness with abduction. PROM into ER was WNL and not acutely painful   Patient reports pain is up by upper trapezius   Performed soft tissue mobilization over upper trapezius, blending into levator scapulae on R upper extremity. Patient reported significant relief of resting pain in her RUE after completion, discussed use of TDN in this area in follow up, which she agreed to.                            PT Education - 10/11/17 0854    Education provided Yes   Education Details That exercise and soft tissue mobilization may be a viable option to help manage her R shoulder pain.    Person(s) Educated Patient   Methods Explanation;Demonstration   Comprehension Verbalized understanding;Returned demonstration             PT Long Term Goals - 09/28/17 1416      PT LONG TERM GOAL #1   Title Patient will be able to demonstrate sit to stand without use of UEs demonstrating improved functional LE strength.    Baseline Unable to stand without use of UEs; 08/08/17: unable to perform without UE support   Time 8   Period Weeks   Status  Achieved     PT LONG TERM GOAL #2   Title Patient will demonstrate FGA of at least 12/30 to demonstrate improved dynamic balance to demonstrate improved safety with community mobility.    Baseline 7/30 at initial evaluation; 08/08/17: 10/30   Time 8   Period Weeks     PT LONG TERM GOAL #3   Title Patient will demonstrate 5x sit to stand and TUG score of less than 17" to demonstrate improved safety with ambulation.    Baseline 08/08/17: TUG: 16.6s; 5TSTS: unable to perform   Period Weeks   Status Partially Met     PT LONG TERM GOAL #4   Title Patient will be able to ambulate around her neighborhood with her dog to return to previous level of activity.    Baseline Unable to complete. 08/08/17: pt still unable to perform   Time 8   Period Weeks   Status On-going     PT LONG  TERM GOAL #5   Title Patient will report worst pain of no more than 1/10 with shifting gears or lifting her pocket up to demonstrate improved tolerance for ADLs.    Baseline Moderate to severe pain and inability to complete.    Time 6   Period Weeks   Status New               Plan - 2017-10-31 0855    Clinical Impression Statement Patient presents with pain/trigger points in her RUE, localized primarily in her R upper trapezius area. She responds quite well to soft tissue mobilization, and agreed that trigger point dry needling may be appropriate at follow up. She would likely benefit from strengthening of her RUE as well, specifically posterior cuff and peri-scapular musculature when she is less acutely painful.    Clinical Presentation Stable   Clinical Decision Making Moderate   Rehab Potential Fair   PT Frequency 2x / week   PT Duration 6 weeks   PT Treatment/Interventions Aquatic Therapy;Moist Heat;Cryotherapy;Electrical Stimulation;Ultrasound;Therapeutic exercise;Therapeutic activities;Gait training;Stair training;Neuromuscular re-education;Manual techniques;Taping;Patient/family education   PT Next Visit Plan Progress dynamic functional balance and generalized strengthening.    PT Home Exercise Plan Supine bridging, mini squats, standing hip abductions with yellow t-bands, side stepping with yellow t-band   Consulted and Agree with Plan of Care Patient      Patient will benefit from skilled therapeutic intervention in order to improve the following deficits and impairments:  Abnormal gait, Decreased activity tolerance, Decreased balance, Decreased endurance, Decreased strength, Difficulty walking, Decreased knowledge of precautions, Decreased mobility, Pain, Decreased range of motion  Visit Diagnosis: Difficulty in walking, not elsewhere classified  Chronic right shoulder pain  Neck pain  Chronic left shoulder pain       G-Codes - 2017/10/31 0857    Functional  Assessment Tool Used (Outpatient Only) Pain report and ROM provided by patient    Functional Limitation Carrying, moving and handling objects   Mobility: Walking and Moving Around Current Status (H5456) At least 1 percent but less than 20 percent impaired, limited or restricted   Mobility: Walking and Moving Around Goal Status [phone]) At least 1 percent but less than 20 percent impaired, limited or restricted   Carrying, Moving and Handling Objects Current Status (L3734) At least 20 percent but less than 40 percent impaired, limited or restricted   Carrying, Moving and Handling Objects Goal Status (K8768) At least 1 percent but less than 20 percent impaired, limited or restricted      Problem List There  are no active problems to display for this patient.  Royce Macadamia PT, DPT, CSCS    10/11/2017, 8:58 AM  [facility] [address], [place], [address] Phone: [phone]   Fax:  [phone]  Name: [name] MRN: [id number] Date of Birth: 09/27/1930

## 2017-10-12 ENCOUNTER — Ambulatory Visit: Payer: Medicare Other | Attending: Family Medicine | Admitting: Physical Therapy

## 2017-10-12 DIAGNOSIS — M25511 Pain in right shoulder: Secondary | ICD-10-CM | POA: Diagnosis present

## 2017-10-12 DIAGNOSIS — R262 Difficulty in walking, not elsewhere classified: Secondary | ICD-10-CM | POA: Diagnosis not present

## 2017-10-12 DIAGNOSIS — M542 Cervicalgia: Secondary | ICD-10-CM

## 2017-10-12 DIAGNOSIS — G8929 Other chronic pain: Secondary | ICD-10-CM

## 2017-10-12 DIAGNOSIS — M25512 Pain in left shoulder: Secondary | ICD-10-CM | POA: Insufficient documentation

## 2017-10-16 ENCOUNTER — Ambulatory Visit: Payer: Medicare Other | Admitting: Physical Therapy

## 2017-10-16 DIAGNOSIS — M25512 Pain in left shoulder: Secondary | ICD-10-CM

## 2017-10-16 DIAGNOSIS — R262 Difficulty in walking, not elsewhere classified: Secondary | ICD-10-CM | POA: Diagnosis not present

## 2017-10-16 DIAGNOSIS — G8929 Other chronic pain: Secondary | ICD-10-CM

## 2017-10-16 DIAGNOSIS — M25511 Pain in right shoulder: Principal | ICD-10-CM

## 2017-10-16 DIAGNOSIS — M542 Cervicalgia: Secondary | ICD-10-CM

## 2017-10-16 NOTE — Patient Instructions (Addendum)
STM   Sidelying ER with towel and 1# weight with PT cuing for proper mechanics x12, with 2# x 12, 3# x  Low rows standing with Red t-band x 12 for 3 sets with cuing for proper technique   Seated rows with OMEGA with 10# x 10 for 2 sets (cuing for keeping her R upper trap inactive)

## 2017-10-16 NOTE — Therapy (Signed)
[facility] [address] [place], [address] Phone: [phone]   Fax:  [phone]  Physical Therapy Treatment  Patient Details  Name: [name] MRN: [id number] Date of Birth: 10-15-1930 Referring Provider: Dr. Prince Rome   Encounter Date: 10/12/2017  PT End of Session - 10/16/17 0841    Visit Number  25    Number of Visits  35    Date for PT Re-Evaluation  11/27/17    Authorization Type  1/10    PT Start Time  1505    PT Stop Time  1550    PT Time Calculation (min)  45 min    Equipment Utilized During Treatment  --    Activity Tolerance  Patient tolerated treatment well    Behavior During Therapy  [facility] for tasks assessed/performed       Past Medical History:  Diagnosis Date  . Anemia   . GERD (gastroesophageal reflux disease)   . Hypertension   . Syncope     No past surgical history on file.  There were no vitals filed for this visit.  Subjective Assessment - 10/16/17 0845    Subjective  Patient reports she continues to get pain in her R shoulder especially with activity. She reports she is frustrated as the shots have not been as helpful as they had been previously and she is not sure what her medical options are for treatment as she does not want surgery.     Pertinent History  She had a previous cervical surgery when she was 51, has 3 surgeries in her lumbar spine.     Limitations  Walking;House hold activities    Patient Stated Goals  To be able to walk her dog around the neighborhood and be able to garden.     Currently in Pain?  Yes    Pain Score  -- Patient reports mild to moderate discomfort in her R shoulder and upper trapezius areas   Patient reports mild to moderate discomfort in her R shoulder and upper trapezius areas   Pain Location  Shoulder    Pain Orientation  Right;Upper;Posterior;Lateral    Pain Descriptors / Indicators  Aching    Pain Type  Chronic pain    Pain Onset  More  than a month ago    Pain Frequency  Intermittent    Aggravating Factors   Overhead motions and shifting gears in her car.        Soft tissue mobilization performed over R upper trapezius and R lateral glenohumeral joint areas secondary to notable pain/discomfort upon palpation which reduced with continued pressure holds/STM, patient reports less resting pain to complete    Isometric ER in supine at neutral rotation and roughly 75 degrees of abduction for 3 sets x 10 repetitions for 3-5" holds   Sidelying ER with towel roll between rib cage and humerus with PT guiding movement with 1# DB x 12 repetitions for 2 sets (appropriate activation)  Observed gait mechanics, noted patient may be hiking RUE with excessive weightbearing through her RUE at mid-stance which may be increasing loading demand and length-tension relationship of RUE upper trapezius. She may benefit from lowering her cane or cuing to reduce how much weight she bears through it.                        PT Education - 10/16/17 0844    Education provided  Yes  Education Details  Role of isometrics and ER strengthening as well as possibly changing height of her cane or relying less upon it to decrease RUE loading.     Person(s) Educated  Patient    Methods  Explanation;Demonstration    Comprehension  Verbalized understanding;Returned demonstration          PT Long Term Goals - 09/28/17 1416      PT LONG TERM GOAL #1   Title  Patient will be able to demonstrate sit to stand without use of UEs demonstrating improved functional LE strength.     Baseline  Unable to stand without use of UEs; 08/08/17: unable to perform without UE support    Time  8    Period  Weeks    Status  Achieved      PT LONG TERM GOAL #2   Title  Patient will demonstrate FGA of at least 12/30 to demonstrate improved dynamic balance to demonstrate improved safety with community mobility.     Baseline  7/30 at initial evaluation;  08/08/17: 10/30    Time  8    Period  Weeks      PT LONG TERM GOAL #3   Title  Patient will demonstrate 5x sit to stand and TUG score of less than 17" to demonstrate improved safety with ambulation.     Baseline  08/08/17: TUG: 16.6s; 5TSTS: unable to perform    Period  Weeks    Status  Partially Met      PT LONG TERM GOAL #4   Title  Patient will be able to ambulate around her neighborhood with her dog to return to previous level of activity.     Baseline  Unable to complete. 08/08/17: pt still unable to perform    Time  8    Period  Weeks    Status  On-going      PT LONG TERM GOAL #5   Title  Patient will report worst pain of no more than 1/10 with shifting gears or lifting her pocket up to demonstrate improved tolerance for ADLs.     Baseline  Moderate to severe pain and inability to complete.     Time  6    Period  Weeks    Status  New            Plan - 10/16/17 6144    Clinical Impression Statement  Patient continues to present with tenderness in R long head of biceps area along with R upper trapezius. She is able to tolerate soft tissue mobilization and gentle isometric/active strengthening progressions in this session, though she may benefit from education on changing height or how much she relies on cane as she is likely hiking her shoulder and increasing upper trap loading.     Clinical Presentation  Stable    Clinical Decision Making  Moderate    Rehab Potential  Fair    PT Frequency  2x / week    PT Duration  6 weeks    PT Treatment/Interventions  Aquatic Therapy;Moist Heat;Cryotherapy;Electrical Stimulation;Ultrasound;Therapeutic exercise;Therapeutic activities;Gait training;Stair training;Neuromuscular re-education;Manual techniques;Taping;Patient/family education    PT Next Visit Plan  Progress dynamic functional balance and generalized strengthening.     PT Home Exercise Plan  Supine bridging, mini squats, standing hip abductions with yellow t-bands, side stepping  with yellow t-band    Consulted and Agree with Plan of Care  Patient       Patient will benefit from skilled therapeutic intervention in order to  improve the following deficits and impairments:  Abnormal gait, Decreased activity tolerance, Decreased balance, Decreased endurance, Decreased strength, Difficulty walking, Decreased knowledge of precautions, Decreased mobility, Pain, Decreased range of motion  Visit Diagnosis: Difficulty in walking, not elsewhere classified  Chronic right shoulder pain  Neck pain  Chronic left shoulder pain     Problem List Patient Active Problem List   Diagnosis Date Noted  . Lymphedema 10/11/2017  . Chronic venous insufficiency 10/11/2017  . Leg pain 10/11/2017  . Leg swelling 10/11/2017  . COPD (chronic obstructive pulmonary disease) ([place]) 10/11/2017  . Essential hypertension 10/11/2017    Royce Macadamia PT, DPT, CSCS    10/16/2017, 8:48 AM  [facility] [address] [place], [address] Phone: [phone]   Fax:  [phone]  Name: [name] MRN: [id number] Date of Birth: [birth year]

## 2017-10-17 NOTE — Therapy (Signed)
[facility] [address] [place], [address] Phone: [phone]   Fax:  [phone]  Physical Therapy Treatment  Patient Details  Name: [name] MRN: [id number] Date of Birth: [birth year] Referring Provider: Dr. Prince Rome   Encounter Date: 10/16/2017  PT End of Session - 10/17/17 0941    Visit Number  26    Number of Visits  35    Date for PT Re-Evaluation  11/27/17    Authorization Type  2/10    PT Start Time  1310    PT Stop Time  1345    PT Time Calculation (min)  35 min    Activity Tolerance  Patient tolerated treatment well    Behavior During Therapy  [facility] for tasks assessed/performed       Past Medical History:  Diagnosis Date  . Anemia   . GERD (gastroesophageal reflux disease)   . Hypertension   . Syncope     No past surgical history on file.  There were no vitals filed for this visit.  Subjective Assessment - 10/16/17 1312    Subjective  Patient reports her RUE has continued to bother her. She has found relief temporarily with soft tissue mobilization and therapy treatments thus far, however it has not been long lasting yet.     Pertinent History  She had a previous cervical surgery when she was 81, has 3 surgeries in her lumbar spine.     Limitations  Walking;House hold activities    Patient Stated Goals  To be able to walk her dog around the neighborhood and be able to garden.     Currently in Pain?  Yes    Pain Score  -- Patient reports more discomfort (moderate) in the lateral portion of R upper extremity in the [facility] joint area and posterior cuff tendons.   Patient reports more discomfort (moderate) in the lateral portion of R upper extremity in the [facility] joint area and posterior cuff tendons.   Pain Onset  More than a month ago    Pain Frequency  Constant       STM over painful areas indicated by patient, specifically noted in the infraspinatus and teres minor as well as middle  deltoid and over the long head of the biceps. Patient reported decrease in symptoms after completion (less pain at rest).   Sidelying ER with towel and 1# weight with PT cuing for proper mechanics x12, with 2# x 12, 3# x12  Low rows standing with Red t-band x 12 for 3 sets with cuing for proper technique   Seated rows with OMEGA with 10# x 10 for 2 sets (cuing for keeping her R upper trap inactive)                       PT Education - 10/17/17 0940    Education provided  Yes    Education Details  Previous biceps injury can lead to some of the complaints she is describing, PT can help by strengthening cuff musculature.     Person(s) Educated  Patient    Methods  Explanation;Demonstration    Comprehension  Verbalized understanding;Returned demonstration          PT Long Term Goals - 09/28/17 1416      PT LONG TERM GOAL #1   Title  Patient will be able to demonstrate sit to stand without use of UEs demonstrating improved functional LE strength.  Baseline  Unable to stand without use of UEs; 08/08/17: unable to perform without UE support    Time  8    Period  Weeks    Status  Achieved      PT LONG TERM GOAL #2   Title  Patient will demonstrate FGA of at least 12/30 to demonstrate improved dynamic balance to demonstrate improved safety with community mobility.     Baseline  7/30 at initial evaluation; 08/08/17: 10/30    Time  8    Period  Weeks      PT LONG TERM GOAL #3   Title  Patient will demonstrate 5x sit to stand and TUG score of less than 17" to demonstrate improved safety with ambulation.     Baseline  08/08/17: TUG: 16.6s; 5TSTS: unable to perform    Period  Weeks    Status  Partially Met      PT LONG TERM GOAL #4   Title  Patient will be able to ambulate around her neighborhood with her dog to return to previous level of activity.     Baseline  Unable to complete. 08/08/17: pt still unable to perform    Time  8    Period  Weeks    Status   On-going      PT LONG TERM GOAL #5   Title  Patient will report worst pain of no more than 1/10 with shifting gears or lifting her pocket up to demonstrate improved tolerance for ADLs.     Baseline  Moderate to severe pain and inability to complete.     Time  6    Period  Weeks    Status  New            Plan - 10/17/17 0941    Clinical Impression Statement  Patient has a history of biceps injury on her RUE and complains of symptoms of clicking in the shoulder, often seen with labral injuries. She is quite weak and has poor awareness of how to utilize posterior cuff musculature, and continues to demonstrate weakness secondary to pain in her RUE limiting her ability to actively lift her RUE. She would likely continue to benefit from skilled PT services to address the listed deficits.     Clinical Presentation  Stable    Clinical Decision Making  Moderate    Rehab Potential  Fair    PT Frequency  2x / week    PT Duration  6 weeks    PT Treatment/Interventions  Aquatic Therapy;Moist Heat;Cryotherapy;Electrical Stimulation;Ultrasound;Therapeutic exercise;Therapeutic activities;Gait training;Stair training;Neuromuscular re-education;Manual techniques;Taping;Patient/family education    PT Next Visit Plan  Progress dynamic functional balance and generalized strengthening.     PT Home Exercise Plan  Supine bridging, mini squats, standing hip abductions with yellow t-bands, side stepping with yellow t-band    Consulted and Agree with Plan of Care  Patient       Patient will benefit from skilled therapeutic intervention in order to improve the following deficits and impairments:  Abnormal gait, Decreased activity tolerance, Decreased balance, Decreased endurance, Decreased strength, Difficulty walking, Decreased knowledge of precautions, Decreased mobility, Pain, Decreased range of motion  Visit Diagnosis: Chronic right shoulder pain  Neck pain  Chronic left shoulder pain     Problem  List Patient Active Problem List   Diagnosis Date Noted  . Lymphedema 10/11/2017  . Chronic venous insufficiency 10/11/2017  . Leg pain 10/11/2017  . Leg swelling 10/11/2017  . COPD (chronic obstructive pulmonary disease) ([place]) 10/11/2017  .  Essential hypertension 10/11/2017   Royce Macadamia PT, DPT, CSCS    10/17/2017, 9:44 AM  [facility] [address], [place], [address] Phone: [phone]   Fax:  [phone]  Name: [name] MRN: [id number] Date of Birth: 19-Dec-1929

## 2017-10-19 ENCOUNTER — Ambulatory Visit: Payer: Medicare Other | Admitting: Physical Therapy

## 2017-10-19 DIAGNOSIS — G8929 Other chronic pain: Secondary | ICD-10-CM

## 2017-10-19 DIAGNOSIS — R262 Difficulty in walking, not elsewhere classified: Secondary | ICD-10-CM | POA: Diagnosis not present

## 2017-10-19 DIAGNOSIS — M542 Cervicalgia: Secondary | ICD-10-CM

## 2017-10-19 DIAGNOSIS — M25511 Pain in right shoulder: Principal | ICD-10-CM

## 2017-10-19 NOTE — Therapy (Signed)
[facility] [address] [place], [address] Phone: [phone]   Fax:  [phone]  Physical Therapy Treatment  Patient Details  Name: [name] MRN: [id number] Date of Birth: [birth year] Referring Provider: Dr. Prince Rome   Encounter Date: 10/19/2017  PT End of Session - 10/19/17 1638    Visit Number  27    Number of Visits  35    Date for PT Re-Evaluation  11/27/17    Authorization Type  2/10    PT Start Time  1608    PT Stop Time  1648    PT Time Calculation (min)  40 min    Activity Tolerance  Patient tolerated treatment well    Behavior During Therapy  [facility] for tasks assessed/performed       Past Medical History:  Diagnosis Date  . Anemia   . GERD (gastroesophageal reflux disease)   . Hypertension   . Syncope     No past surgical history on file.  There were no vitals filed for this visit.  Subjective Assessment - 10/19/17 1635    Subjective  Patient reports her RUE has been feeling some better, she has found the soft tissue mobilization to be helpful. She still has some pain and achiness and difficulty with lifting her RUE.     Pertinent History  She had a previous cervical surgery when she was 60, has 3 surgeries in her lumbar spine.     Limitations  Walking;House hold activities    Patient Stated Goals  To be able to walk her dog around the neighborhood and be able to garden.     Currently in Pain?  Yes    Pain Score  -- Mild pain    Pain Location  Arm    Pain Orientation  Right;Lateral;Upper    Pain Descriptors / Indicators  Aching    Pain Type  Chronic pain    Pain Onset  More than a month ago    Pain Frequency  Intermittent         Soft tissue mobilization over levator scapulae as patient initially had multiple tender areas, though with prolonged bout of mobilization, noted to have decreased discomfort from previous sessions,   AAROM with PVC pipe - x 12  Joint mobilizations  with most reproductive pain around T3/T4  Attempted rhythmic stabilization - patient reported some catching sensation in the shoulder   Isometric ER 3 sets x 8 repetitions with limited concentric motion                       PT Education - 10/19/17 1638    Education provided  Yes    Education Details  Will progress strengthening and manual techniques as indicated to continue to reduce discomfort and improve function.     Person(s) Educated  Patient    Methods  Explanation;Demonstration    Comprehension  Verbalized understanding;Returned demonstration          PT Long Term Goals - 09/28/17 1416      PT LONG TERM GOAL #1   Title  Patient will be able to demonstrate sit to stand without use of UEs demonstrating improved functional LE strength.     Baseline  Unable to stand without use of UEs; 08/08/17: unable to perform without UE support    Time  8    Period  Weeks    Status  Achieved  PT LONG TERM GOAL #2   Title  Patient will demonstrate FGA of at least 12/30 to demonstrate improved dynamic balance to demonstrate improved safety with community mobility.     Baseline  7/30 at initial evaluation; 08/08/17: 10/30    Time  8    Period  Weeks      PT LONG TERM GOAL #3   Title  Patient will demonstrate 5x sit to stand and TUG score of less than 17" to demonstrate improved safety with ambulation.     Baseline  08/08/17: TUG: 16.6s; 5TSTS: unable to perform    Period  Weeks    Status  Partially Met      PT LONG TERM GOAL #4   Title  Patient will be able to ambulate around her neighborhood with her dog to return to previous level of activity.     Baseline  Unable to complete. 08/08/17: pt still unable to perform    Time  8    Period  Weeks    Status  On-going      PT LONG TERM GOAL #5   Title  Patient will report worst pain of no more than 1/10 with shifting gears or lifting her pocket up to demonstrate improved tolerance for ADLs.     Baseline  Moderate to  severe pain and inability to complete.     Time  6    Period  Weeks    Status  New            Plan - 10/19/17 1638    Clinical Impression Statement  Patient reports significant improvement in pain with active motion and overhead motions. She is still having significant ER strength deficits, which are likely contributing to the poor mechanics noted with her overhead motion.     Clinical Presentation  Stable    Clinical Decision Making  Moderate    Rehab Potential  Fair    PT Frequency  2x / week    PT Duration  6 weeks    PT Treatment/Interventions  Aquatic Therapy;Moist Heat;Cryotherapy;Electrical Stimulation;Ultrasound;Therapeutic exercise;Therapeutic activities;Gait training;Stair training;Neuromuscular re-education;Manual techniques;Taping;Patient/family education    PT Next Visit Plan  Progress dynamic functional balance and generalized strengthening.     PT Home Exercise Plan  Supine bridging, mini squats, standing hip abductions with yellow t-bands, side stepping with yellow t-band    Consulted and Agree with Plan of Care  Patient       Patient will benefit from skilled therapeutic intervention in order to improve the following deficits and impairments:  Abnormal gait, Decreased activity tolerance, Decreased balance, Decreased endurance, Decreased strength, Difficulty walking, Decreased knowledge of precautions, Decreased mobility, Pain, Decreased range of motion  Visit Diagnosis: Chronic right shoulder pain  Neck pain     Problem List Patient Active Problem List   Diagnosis Date Noted  . Lymphedema 10/11/2017  . Chronic venous insufficiency 10/11/2017  . Leg pain 10/11/2017  . Leg swelling 10/11/2017  . COPD (chronic obstructive pulmonary disease) ([place]) 10/11/2017  . Essential hypertension 10/11/2017   Royce Macadamia PT, DPT, CSCS    10/19/2017, 5:01 PM  [facility] [address] [place], [address] Phone: [phone]   Fax:  [phone]  Name: [name] MRN: [id number] Date of Birth: 19-Nov-1930

## 2017-10-19 NOTE — Patient Instructions (Addendum)
Soft tissue mobilization over levator scapulae   AAROM with PVC pipe - x 12  Joint mobilizations with most reproductive pain around T3/T4  Attempted rhythmic stabilization - patient reported some catching sensation in the shoulder   Isometric ER 3 sets x 8 repetitions with limited concentric motion

## 2017-10-23 ENCOUNTER — Ambulatory Visit: Payer: Medicare Other | Admitting: Physical Therapy

## 2017-10-23 DIAGNOSIS — M25512 Pain in left shoulder: Secondary | ICD-10-CM

## 2017-10-23 DIAGNOSIS — R262 Difficulty in walking, not elsewhere classified: Secondary | ICD-10-CM | POA: Diagnosis not present

## 2017-10-23 DIAGNOSIS — M25511 Pain in right shoulder: Principal | ICD-10-CM

## 2017-10-23 DIAGNOSIS — M542 Cervicalgia: Secondary | ICD-10-CM

## 2017-10-23 DIAGNOSIS — G8929 Other chronic pain: Secondary | ICD-10-CM

## 2017-10-23 NOTE — Therapy (Signed)
[facility] [address], [place], [address] Phone: [phone]   Fax:  [phone]  Physical Therapy Treatment  Patient Details  Name: [name] MRN: [id number] Date of Birth: [birth year] Referring Provider: Dr. Prince Rome   Encounter Date: 10/23/2017  PT End of Session - 10/23/17 1330    Visit Number  28    Number of Visits  35    Date for PT Re-Evaluation  11/27/17    Authorization Type  2/10    PT Start Time  1306    PT Stop Time  1350    PT Time Calculation (min)  44 min    Activity Tolerance  Patient tolerated treatment well    Behavior During Therapy  [facility] for tasks assessed/performed       Past Medical History:  Diagnosis Date  . Anemia   . GERD (gastroesophageal reflux disease)   . Hypertension   . Syncope     No past surgical history on file.  There were no vitals filed for this visit.  Subjective Assessment - 10/23/17 1325    Subjective  Patient reports she would like to try dry needling as she continues to have discomfort in her R upper trapezius and posterior cervical extensor musculature.     Pertinent History  She had a previous cervical surgery when she was 66, has 3 surgeries in her lumbar spine.     Limitations  Walking;House hold activities    Patient Stated Goals  To be able to walk her dog around the neighborhood and be able to garden.     Currently in Pain?  Other (Comment) At rest she is not having much pain, pain in the upper trap and pectoral areas on the R side with active movements    Pain Location  Arm    Pain Orientation  Right;Upper    Pain Descriptors / Indicators  Aching    Pain Onset  More than a month ago    Pain Frequency  Intermittent       TDN -- patient consented, educated on risks, contra-indications, complications, and anticipated sensations throughout process. Performed in R upper trapezius and R cervical extensor musculature at roughly C7, T1 (2 sticks  applied) Patient noted to have local twitch response in both instances, no immediate complications noted. (Unbilled)   ER PROM- WNL   Chest press 2# x 12, 3# x 10, x 12 with min A on 3rd set   Scaption with 1# Db x 10 repetitions (pain in biceps on 2nd repetition, which dissipated) for 2 sets   1 arm cable rows 5# x 11 repetitions, x 12 for 2 sets   Seated OMEGA rows with bilateral UEs and 10# x 12 repetitions for 3 sets   In scaption at 90 degrees of abduction going into end range ER with BW x 12, with 1# weight x 10 for 3 sets (PT providing cuing/tactile and verbal to perform correctly)                        PT Education - 10/23/17 1351    Education provided  Yes    Education Details  Will perform TDN again if she has positive effects in the next few days.     Person(s) Educated  Patient    Methods  Explanation;Demonstration    Comprehension  Verbalized understanding;Returned demonstration          PT Long  Term Goals - 09/28/17 1416      PT LONG TERM GOAL #1   Title  Patient will be able to demonstrate sit to stand without use of UEs demonstrating improved functional LE strength.     Baseline  Unable to stand without use of UEs; 08/08/17: unable to perform without UE support    Time  8    Period  Weeks    Status  Achieved      PT LONG TERM GOAL #2   Title  Patient will demonstrate FGA of at least 12/30 to demonstrate improved dynamic balance to demonstrate improved safety with community mobility.     Baseline  7/30 at initial evaluation; 08/08/17: 10/30    Time  8    Period  Weeks      PT LONG TERM GOAL #3   Title  Patient will demonstrate 5x sit to stand and TUG score of less than 17" to demonstrate improved safety with ambulation.     Baseline  08/08/17: TUG: 16.6s; 5TSTS: unable to perform    Period  Weeks    Status  Partially Met      PT LONG TERM GOAL #4   Title  Patient will be able to ambulate around her neighborhood with her dog to return  to previous level of activity.     Baseline  Unable to complete. 08/08/17: pt still unable to perform    Time  8    Period  Weeks    Status  On-going      PT LONG TERM GOAL #5   Title  Patient will report worst pain of no more than 1/10 with shifting gears or lifting her pocket up to demonstrate improved tolerance for ADLs.     Baseline  Moderate to severe pain and inability to complete.     Time  6    Period  Weeks    Status  New            Plan - 10/23/17 1330    Clinical Impression Statement  Patient continues to have ongoing R shoulder pain, with TDN two twitch responses were noted and after completing ther-ex in clinic today she is able to lift her arm past 90 degrees without a catch. She would benefit from continued progression of pain management and UE strengthening to allow for her to have more functional use of RUE.     Clinical Presentation  Stable    Clinical Decision Making  Moderate    Rehab Potential  Fair    PT Frequency  2x / week    PT Duration  6 weeks    PT Treatment/Interventions  Aquatic Therapy;Moist Heat;Cryotherapy;Electrical Stimulation;Ultrasound;Therapeutic exercise;Therapeutic activities;Gait training;Stair training;Neuromuscular re-education;Manual techniques;Taping;Patient/family education    PT Next Visit Plan  Progress dynamic functional balance and generalized strengthening.     PT Home Exercise Plan  Supine bridging, mini squats, standing hip abductions with yellow t-bands, side stepping with yellow t-band    Consulted and Agree with Plan of Care  Patient       Patient will benefit from skilled therapeutic intervention in order to improve the following deficits and impairments:  Abnormal gait, Decreased activity tolerance, Decreased balance, Decreased endurance, Decreased strength, Difficulty walking, Decreased knowledge of precautions, Decreased mobility, Pain, Decreased range of motion  Visit Diagnosis: Chronic right shoulder pain  Neck  pain  Difficulty in walking, not elsewhere classified  Chronic left shoulder pain     Problem List Patient Active Problem List  Diagnosis Date Noted  . Lymphedema 10/11/2017  . Chronic venous insufficiency 10/11/2017  . Leg pain 10/11/2017  . Leg swelling 10/11/2017  . COPD (chronic obstructive pulmonary disease) ([place]) 10/11/2017  . Essential hypertension 10/11/2017   Royce Macadamia PT, DPT, CSCS    10/23/2017, 2:20 PM  [facility] [address] [place], [address] Phone: [phone]   Fax:  [phone]  Name: [name] MRN: [id number] Date of Birth: 1930-04-02

## 2017-10-23 NOTE — Patient Instructions (Addendum)
TDN  ER PROM- WNL   Chest press 2# x 12, 3# x 10, x 12 with min A on 3rd set   Scaption with 1# Db x 10 repetitions (pain in biceps on 2nd repetition, which dissipated) for 2 sets   1 arm cable rows 5# x 11 repetitions, x 12 for 2 sets   Seated OMEGA rows with bilateral UEs and 10# x 12 repetitions for 3 sets   In scaption at 90 degrees of abduction going into end range ER with BW x 12, with 1# weight x 10 for 3 sets (PT providing cuing/tactile and verbal to perform correctly)

## 2017-10-25 ENCOUNTER — Encounter: Payer: Medicare Other | Admitting: Physical Therapy

## 2017-10-26 ENCOUNTER — Ambulatory Visit: Payer: Medicare Other | Admitting: Physical Therapy

## 2017-10-26 DIAGNOSIS — M25511 Pain in right shoulder: Principal | ICD-10-CM

## 2017-10-26 DIAGNOSIS — M542 Cervicalgia: Secondary | ICD-10-CM

## 2017-10-26 DIAGNOSIS — G8929 Other chronic pain: Secondary | ICD-10-CM

## 2017-10-26 DIAGNOSIS — R262 Difficulty in walking, not elsewhere classified: Secondary | ICD-10-CM | POA: Diagnosis not present

## 2017-10-26 NOTE — Therapy (Signed)
[facility] [address] [place], [address] Phone: [phone]   Fax:  [phone]  Physical Therapy Treatment  Patient Details  Name: [name] MRN: [id number] Date of Birth: 1930/11/05 Referring Provider: Dr. Prince Rome   Encounter Date: 10/26/2017  PT End of Session - 10/26/17 1244    Visit Number  29    Number of Visits  35    Date for PT Re-Evaluation  11/27/17    Authorization Type  2/10    PT Start Time  1125    PT Stop Time  1203    PT Time Calculation (min)  38 min    Activity Tolerance  Patient tolerated treatment well    Behavior During Therapy  [facility] for tasks assessed/performed       Past Medical History:  Diagnosis Date  . Anemia   . GERD (gastroesophageal reflux disease)   . Hypertension   . Syncope     No past surgical history on file.  There were no vitals filed for this visit.  Subjective Assessment - 10/26/17 1150    Subjective  Patient reports the dry needling in the last session was very helpful, she has been able to lift her R arm independently, though she still gets some pain. Previously she had been having to help her RUE with her LUE     Pertinent History  She had a previous cervical surgery when she was 38, has 3 surgeries in her lumbar spine.     Limitations  Walking;House hold activities    Patient Stated Goals  To be able to walk her dog around the neighborhood and be able to garden.     Currently in Pain?  Other (Comment)    Pain Onset  More than a month ago      Trigger point dry needling applied to T1 and C6 cervical extensor musculature 1 finger width to the right of spinous process, noted to have decreased tissue resistance and LTR, especially noted at T2. (unbilled) Patient able to flex RUE through available ROM with mild pain without use of contralateral UE.   Seated rows 10# x 12 for 3 sets   Standing ER on RUE only with yellow t-band anchored to pillars x  15 repetitions x 3 sets   Low rows with green t-band x 12 for 3 sets   Soft tissue mobilization performed over levator scapulae, middle trapezius, and upper trapezius with notable tender areas in middle trapezius which improved with prolonged mobilization.                      PT Education - 10/26/17 1244    Education provided  Yes    Education Details  Will continue to use TDN if it is beneficial for her to allow for her to complete ther-ex.    Person(s) Educated  Patient    Methods  Explanation    Comprehension  Verbalized understanding          PT Long Term Goals - 09/28/17 1416      PT LONG TERM GOAL #1   Title  Patient will be able to demonstrate sit to stand without use of UEs demonstrating improved functional LE strength.     Baseline  Unable to stand without use of UEs; 08/08/17: unable to perform without UE support    Time  8    Period  Weeks    Status  Achieved  PT LONG TERM GOAL #2   Title  Patient will demonstrate FGA of at least 12/30 to demonstrate improved dynamic balance to demonstrate improved safety with community mobility.     Baseline  7/30 at initial evaluation; 08/08/17: 10/30    Time  8    Period  Weeks      PT LONG TERM GOAL #3   Title  Patient will demonstrate 5x sit to stand and TUG score of less than 17" to demonstrate improved safety with ambulation.     Baseline  08/08/17: TUG: 16.6s; 5TSTS: unable to perform    Period  Weeks    Status  Partially Met      PT LONG TERM GOAL #4   Title  Patient will be able to ambulate around her neighborhood with her dog to return to previous level of activity.     Baseline  Unable to complete. 08/08/17: pt still unable to perform    Time  8    Period  Weeks    Status  On-going      PT LONG TERM GOAL #5   Title  Patient will report worst pain of no more than 1/10 with shifting gears or lifting her pocket up to demonstrate improved tolerance for ADLs.     Baseline  Moderate to severe pain  and inability to complete.     Time  6    Period  Weeks    Status  New            Plan - 10/26/17 1246    Clinical Impression Statement  Patient has had significant improvement in AROM with TDN in c-spine area, she can now lift her RUE above shoulder level independently without use of contralteral UE. She is able to tolerate ther-ex much better today after TDN and soft tissue work as well, and will likely continue to benefit from manual and ther-ex.    Clinical Presentation  Stable    Clinical Decision Making  Moderate    Rehab Potential  Fair    PT Frequency  2x / week    PT Duration  6 weeks    PT Treatment/Interventions  Aquatic Therapy;Moist Heat;Cryotherapy;Electrical Stimulation;Ultrasound;Therapeutic exercise;Therapeutic activities;Gait training;Stair training;Neuromuscular re-education;Manual techniques;Taping;Patient/family education    PT Next Visit Plan  Progress dynamic functional balance and generalized strengthening.     PT Home Exercise Plan  Supine bridging, mini squats, standing hip abductions with yellow t-bands, side stepping with yellow t-band    Consulted and Agree with Plan of Care  Patient       Patient will benefit from skilled therapeutic intervention in order to improve the following deficits and impairments:  Abnormal gait, Decreased activity tolerance, Decreased balance, Decreased endurance, Decreased strength, Difficulty walking, Decreased knowledge of precautions, Decreased mobility, Pain, Decreased range of motion  Visit Diagnosis: Chronic right shoulder pain  Neck pain     Problem List Patient Active Problem List   Diagnosis Date Noted  . Lymphedema 10/11/2017  . Chronic venous insufficiency 10/11/2017  . Leg pain 10/11/2017  . Leg swelling 10/11/2017  . COPD (chronic obstructive pulmonary disease) ([place]) 10/11/2017  . Essential hypertension 10/11/2017    Royce Macadamia PT, DPT, CSCS    10/26/2017, 12:49 PM  [facility] [address] [place], [address] Phone: [phone]   Fax:  [phone]  Name: [name] MRN: [id number] Date of Birth: 01-Jun-1930

## 2017-10-26 NOTE — Patient Instructions (Signed)
Seated rows 10# x 12 for 3 sets   Standing ER on RUE only with yellow t-band anchored to pillars x 15 repetitions x 3 sets

## 2017-10-30 ENCOUNTER — Ambulatory Visit: Payer: Medicare Other | Admitting: Physical Therapy

## 2017-10-30 DIAGNOSIS — M542 Cervicalgia: Secondary | ICD-10-CM

## 2017-10-30 DIAGNOSIS — G8929 Other chronic pain: Secondary | ICD-10-CM

## 2017-10-30 DIAGNOSIS — M25512 Pain in left shoulder: Secondary | ICD-10-CM

## 2017-10-30 DIAGNOSIS — M25511 Pain in right shoulder: Secondary | ICD-10-CM

## 2017-10-30 DIAGNOSIS — R262 Difficulty in walking, not elsewhere classified: Secondary | ICD-10-CM | POA: Diagnosis not present

## 2017-10-30 NOTE — Patient Instructions (Signed)
Chest press 2# x 12, 4# x12, 5# x 12  Single arm rows on cable machine with 5# x 12 for 3 sets   Low rows with red t-band x 12 (next two sets with green t-band x 12)   Seated rows with 10# x 12, 12# x 12 for 2 sets (appropriate technique)   Seated ER at 75 degrees of abduction with arm supported (pain in anterior shoulder, thus lowered to 45 degrees) x 15 with 1# weight, progressed to 2# weight x15 for 2 sets with PT guiding motion   Seated ER with red t-band x 10 for 3 sets (no pain reported)

## 2017-10-30 NOTE — Therapy (Signed)
[facility] [address], [place], [address] Phone: [phone]   Fax:  [phone]  Physical Therapy Treatment  Patient Details  Name: [name] MRN: [id number] Date of Birth: 1930/01/12 Referring Provider: Dr. Prince Rome   Encounter Date: 10/30/2017  PT End of Session - 10/30/17 1520    Visit Number  30    Number of Visits  35    Date for PT Re-Evaluation  11/27/17    Authorization Type  2/10    PT Start Time  1308    PT Stop Time  1349    PT Time Calculation (min)  41 min    Activity Tolerance  Patient tolerated treatment well    Behavior During Therapy  [facility] for tasks assessed/performed       Past Medical History:  Diagnosis Date  . Anemia   . GERD (gastroesophageal reflux disease)   . Hypertension   . Syncope     No past surgical history on file.  There were no vitals filed for this visit.  Subjective Assessment - 10/30/17 1311    Subjective  Patient reports she is still getting pain with reaching for objects or manipulating them into internal rotation. Otherwise she can now lift her arm independently and is not having resting pain.     Pertinent History  She had a previous cervical surgery when she was 73, has 3 surgeries in her lumbar spine.     Limitations  Walking;House hold activities    Patient Stated Goals  To be able to walk her dog around the neighborhood and be able to garden.     Currently in Pain?  No/denies       Chest press 2# x 12, 4# x12, 5# x 12  Single arm rows on cable machine with 5# x 12 for 3 sets   Low rows with red t-band x 12 (next two sets with green t-band x 12)   Seated rows with 10# x 12, 12# x 12 for 2 sets (appropriate technique)   Seated ER at 75 degrees of abduction with arm supported (pain in anterior shoulder, thus lowered to 45 degrees) x 15 with 1# weight, progressed to 2# weight x15 for 2 sets with PT guiding motion   Seated ER with red t-band x  10 for 3 sets (no pain reported)                        PT Education - 10/30/17 1520    Education provided  Yes    Education Details  Patient will benefit from TDN as necessary, but is better suited to stick with strength development at this point.     Person(s) Educated  Patient    Methods  Explanation;Demonstration    Comprehension  Verbalized understanding;Returned demonstration          PT Long Term Goals - 09/28/17 1416      PT LONG TERM GOAL #1   Title  Patient will be able to demonstrate sit to stand without use of UEs demonstrating improved functional LE strength.     Baseline  Unable to stand without use of UEs; 08/08/17: unable to perform without UE support    Time  8    Period  Weeks    Status  Achieved      PT LONG TERM GOAL #2   Title  Patient will demonstrate FGA of at least 12/30 to  demonstrate improved dynamic balance to demonstrate improved safety with community mobility.     Baseline  7/30 at initial evaluation; 08/08/17: 10/30    Time  8    Period  Weeks      PT LONG TERM GOAL #3   Title  Patient will demonstrate 5x sit to stand and TUG score of less than 17" to demonstrate improved safety with ambulation.     Baseline  08/08/17: TUG: 16.6s; 5TSTS: unable to perform    Period  Weeks    Status  Partially Met      PT LONG TERM GOAL #4   Title  Patient will be able to ambulate around her neighborhood with her dog to return to previous level of activity.     Baseline  Unable to complete. 08/08/17: pt still unable to perform    Time  8    Period  Weeks    Status  On-going      PT LONG TERM GOAL #5   Title  Patient will report worst pain of no more than 1/10 with shifting gears or lifting her pocket up to demonstrate improved tolerance for ADLs.     Baseline  Moderate to severe pain and inability to complete.     Time  6    Period  Weeks    Status  New            Plan - 10/30/17 1521    Clinical Impression Statement  Patient has  had excellent response to TDN, manual therapy, and strengthening of rotator cuff musculature. She is now able to elevate her RUE without assistance from her LUE and is reporting no pain at rest or with movement. She would benefit from continued strengthening of peri-scapular musculature and posterior rotator cuff to continue with functional ROM progress.     Clinical Presentation  Stable    Clinical Decision Making  Moderate    Rehab Potential  Fair    PT Frequency  2x / week    PT Duration  6 weeks    PT Treatment/Interventions  Aquatic Therapy;Moist Heat;Cryotherapy;Electrical Stimulation;Ultrasound;Therapeutic exercise;Therapeutic activities;Gait training;Stair training;Neuromuscular re-education;Manual techniques;Taping;Patient/family education    PT Next Visit Plan  Progress dynamic functional balance and generalized strengthening.     PT Home Exercise Plan  Supine bridging, mini squats, standing hip abductions with yellow t-bands, side stepping with yellow t-band    Consulted and Agree with Plan of Care  Patient       Patient will benefit from skilled therapeutic intervention in order to improve the following deficits and impairments:  Abnormal gait, Decreased activity tolerance, Decreased balance, Decreased endurance, Decreased strength, Difficulty walking, Decreased knowledge of precautions, Decreased mobility, Pain, Decreased range of motion  Visit Diagnosis: Neck pain  Chronic right shoulder pain  Chronic left shoulder pain     Problem List Patient Active Problem List   Diagnosis Date Noted  . Lymphedema 10/11/2017  . Chronic venous insufficiency 10/11/2017  . Leg pain 10/11/2017  . Leg swelling 10/11/2017  . COPD (chronic obstructive pulmonary disease) ([place]) 10/11/2017  . Essential hypertension 10/11/2017   Royce Macadamia PT, DPT, CSCS    10/30/2017, 3:24 PM  [facility] [address] [place], [address] Phone: [phone]   Fax:  [phone]  Name: [name] MRN: [id number] Date of Birth: 01-01-1930

## 2017-10-31 ENCOUNTER — Other Ambulatory Visit: Payer: Self-pay | Admitting: Internal Medicine

## 2017-10-31 DIAGNOSIS — Z1231 Encounter for screening mammogram for malignant neoplasm of breast: Secondary | ICD-10-CM

## 2017-11-01 ENCOUNTER — Ambulatory Visit: Payer: Medicare Other | Admitting: Physical Therapy

## 2017-11-01 DIAGNOSIS — R262 Difficulty in walking, not elsewhere classified: Secondary | ICD-10-CM | POA: Diagnosis not present

## 2017-11-01 DIAGNOSIS — G8929 Other chronic pain: Secondary | ICD-10-CM

## 2017-11-01 DIAGNOSIS — M25511 Pain in right shoulder: Secondary | ICD-10-CM

## 2017-11-01 DIAGNOSIS — M542 Cervicalgia: Secondary | ICD-10-CM

## 2017-11-01 NOTE — Therapy (Signed)
[facility] [address] [place], [address] Phone: [phone]   Fax:  [phone]  Physical Therapy Treatment  Patient Details  Name: [name] MRN: [id number] Date of Birth: May 06, 1930 Referring Provider: Dr. Prince Rome   Encounter Date: 11/01/2017  PT End of Session - 11/01/17 1054    Visit Number  31    Number of Visits  35    Date for PT Re-Evaluation  11/27/17    PT Start Time  0955    PT Stop Time  1035    PT Time Calculation (min)  40 min    Activity Tolerance  Patient tolerated treatment well    Behavior During Therapy  [facility] for tasks assessed/performed       Past Medical History:  Diagnosis Date  . Anemia   . GERD (gastroesophageal reflux disease)   . Hypertension   . Syncope     No past surgical history on file.  There were no vitals filed for this visit.  Subjective Assessment - 11/01/17 1059    Subjective  Patient reports her arm is more sore in the lateral portion this date, she does not recall doing anything out of the ordinary to cause it. She is still able to lift her arm without pain inhibiting her.     Pertinent History  She had a previous cervical surgery when she was 42, has 3 surgeries in her lumbar spine.     Limitations  Walking;House hold activities    Patient Stated Goals  To be able to walk her dog around the neighborhood and be able to garden.     Currently in Pain?  Other (Comment) Patient reports she is having soreness in her R biceps/mid-belly to palpation is tender and is sore with motion but reports as mild.     Pain Location  Arm    Pain Orientation  Right;Lateral    Pain Descriptors / Indicators  Sore    Pain Type  Chronic pain    Pain Onset  More than a month ago    Pain Frequency  Intermittent       Standing ER with green t-band x 12 for 3 sets on RUE  Soft tissue mobilization on R biceps/triceps -- multiple tender areas noted, which decreased in  severity after multiple bouts of petrissage applied   Standing low rows with green t-band x 15 for 3 sets on RUE only   Seated rows with 15# with bilateral UEs x 12 for 3 sets   Standing rows with 2# x 15 (too easy) 5# x 15                       PT Education - 11/01/17 1053    Education provided  Yes    Education Details  That soft tissue will be modality of choice for pain management for her arm as needed, but will continue working on strengthening posterior musculature for more prolonged relief.     Person(s) Educated  Patient    Methods  Explanation;Demonstration    Comprehension  Verbalized understanding;Returned demonstration          PT Long Term Goals - 09/28/17 1416      PT LONG TERM GOAL #1   Title  Patient will be able to demonstrate sit to stand without use of UEs demonstrating improved functional LE strength.     Baseline  Unable to stand without use of  UEs; 08/08/17: unable to perform without UE support    Time  8    Period  Weeks    Status  Achieved      PT LONG TERM GOAL #2   Title  Patient will demonstrate FGA of at least 12/30 to demonstrate improved dynamic balance to demonstrate improved safety with community mobility.     Baseline  7/30 at initial evaluation; 08/08/17: 10/30    Time  8    Period  Weeks      PT LONG TERM GOAL #3   Title  Patient will demonstrate 5x sit to stand and TUG score of less than 17" to demonstrate improved safety with ambulation.     Baseline  08/08/17: TUG: 16.6s; 5TSTS: unable to perform    Period  Weeks    Status  Partially Met      PT LONG TERM GOAL #4   Title  Patient will be able to ambulate around her neighborhood with her dog to return to previous level of activity.     Baseline  Unable to complete. 08/08/17: pt still unable to perform    Time  8    Period  Weeks    Status  On-going      PT LONG TERM GOAL #5   Title  Patient will report worst pain of no more than 1/10 with shifting gears or lifting  her pocket up to demonstrate improved tolerance for ADLs.     Baseline  Moderate to severe pain and inability to complete.     Time  6    Period  Weeks    Status  New            Plan - 11/01/17 1054    Clinical Impression Statement  Patient continues to have mild flare ups of pain in the lateral arm in the biceps area, however her AROM and functional utilization of her RUE is much improved from previous sessions. She is demonstrating improving posterior shoulder girdle strength in each follow up session, and is reporting less pain with reaching and manipulating her RUE.     Clinical Presentation  Stable    Clinical Decision Making  Moderate    Rehab Potential  Fair    PT Frequency  2x / week    PT Duration  6 weeks    PT Treatment/Interventions  Aquatic Therapy;Moist Heat;Cryotherapy;Electrical Stimulation;Ultrasound;Therapeutic exercise;Therapeutic activities;Gait training;Stair training;Neuromuscular re-education;Manual techniques;Taping;Patient/family education    PT Next Visit Plan  Progress dynamic functional balance and generalized strengthening.     PT Home Exercise Plan  Supine bridging, mini squats, standing hip abductions with yellow t-bands, side stepping with yellow t-band    Consulted and Agree with Plan of Care  Patient       Patient will benefit from skilled therapeutic intervention in order to improve the following deficits and impairments:  Abnormal gait, Decreased activity tolerance, Decreased balance, Decreased endurance, Decreased strength, Difficulty walking, Decreased knowledge of precautions, Decreased mobility, Pain, Decreased range of motion  Visit Diagnosis: Neck pain  Chronic right shoulder pain     Problem List Patient Active Problem List   Diagnosis Date Noted  . Lymphedema 10/11/2017  . Chronic venous insufficiency 10/11/2017  . Leg pain 10/11/2017  . Leg swelling 10/11/2017  . COPD (chronic obstructive pulmonary disease) ([place]) 10/11/2017  .  Essential hypertension 10/11/2017   Royce Macadamia PT, DPT, CSCS    11/01/2017, 11:01 AM  [facility] [address], [place], [address] Phone: [phone]   Fax:  [phone]  Name: [name] MRN: [id number] Date of Birth: 09/22/1930

## 2017-11-01 NOTE — Patient Instructions (Signed)
Standing ER with green t-band   Soft tissue mobilization on R biceps/triceps   Standing low rows with green t-band x 15 for 3 sets on RUE only   Seated rows   Standing rows with 2# x 15 (too easy) 5# x 15

## 2017-11-06 ENCOUNTER — Ambulatory Visit: Payer: Medicare Other | Admitting: Physical Therapy

## 2017-11-06 ENCOUNTER — Ambulatory Visit: Payer: Medicare Other | Admitting: Surgery

## 2017-11-06 DIAGNOSIS — R262 Difficulty in walking, not elsewhere classified: Secondary | ICD-10-CM | POA: Diagnosis not present

## 2017-11-06 DIAGNOSIS — M25511 Pain in right shoulder: Secondary | ICD-10-CM

## 2017-11-06 DIAGNOSIS — M542 Cervicalgia: Secondary | ICD-10-CM

## 2017-11-06 DIAGNOSIS — G8929 Other chronic pain: Secondary | ICD-10-CM

## 2017-11-08 NOTE — Therapy (Signed)
[facility] [address] [place], [address] Phone: [phone]   Fax:  [phone]  Physical Therapy Treatment  Patient Details  Name: [name] MRN: [id number] Date of Birth: 1930-06-03 Referring Provider: Dr. Prince Rome   Encounter Date: 11/06/2017  PT End of Session - 11/08/17 1457    Visit Number  32    Number of Visits  35    Date for PT Re-Evaluation  11/27/17    PT Start Time  0916    PT Stop Time  0945    PT Time Calculation (min)  29 min    Activity Tolerance  Patient tolerated treatment well    Behavior During Therapy  [facility] for tasks assessed/performed       Past Medical History:  Diagnosis Date  . Anemia   . GERD (gastroesophageal reflux disease)   . Hypertension   . Syncope     No past surgical history on file.  There were no vitals filed for this visit.  Subjective Assessment - 11/07/17 2236    Subjective  Patient reports the dry needling has really helped her R shoulder pain and she continues to be able to lift her arm without pain. She is still having pain in her mid-arm and would like to have dry needling performed if appropriate.     Pertinent History  She had a previous cervical surgery when she was 53, has 3 surgeries in her lumbar spine.     Limitations  Walking;House hold activities    Patient Stated Goals  To be able to walk her dog around the neighborhood and be able to garden.     Currently in Pain?  Yes    Pain Score  -- Mild to moderate discomfort in the mid belly of the lateral triceps/biceps area.    Pain Location  Arm    Pain Orientation  Right;Lateral    Pain Descriptors / Indicators  Aching;Sore    Pain Type  Chronic pain    Pain Onset  More than a month ago    Pain Frequency  Intermittent      Performed trigger point dry needling with pinching technique in lateral triceps/biceps with index finger as backdrop, with LTR elicited. (unbilled)   Performed soft  tissue mobilization throughout biceps and lateral triceps over areas where patient had previously had discomfort and trigger points. Patient reported significant decrease in pain/trigger points found throughout her arm.                         PT Education - 11/08/17 1456    Education provided  Yes    Education Details  Educated on risks and benefits of dry needling, as well as follow up instructions for her R arm discomfort.     Person(s) Educated  Patient    Methods  Explanation;Demonstration    Comprehension  Verbalized understanding;Returned demonstration          PT Long Term Goals - 09/28/17 1416      PT LONG TERM GOAL #1   Title  Patient will be able to demonstrate sit to stand without use of UEs demonstrating improved functional LE strength.     Baseline  Unable to stand without use of UEs; 08/08/17: unable to perform without UE support    Time  8    Period  Weeks    Status  Achieved      PT LONG  TERM GOAL #2   Title  Patient will demonstrate FGA of at least 12/30 to demonstrate improved dynamic balance to demonstrate improved safety with community mobility.     Baseline  7/30 at initial evaluation; 08/08/17: 10/30    Time  8    Period  Weeks      PT LONG TERM GOAL #3   Title  Patient will demonstrate 5x sit to stand and TUG score of less than 17" to demonstrate improved safety with ambulation.     Baseline  08/08/17: TUG: 16.6s; 5TSTS: unable to perform    Period  Weeks    Status  Partially Met      PT LONG TERM GOAL #4   Title  Patient will be able to ambulate around her neighborhood with her dog to return to previous level of activity.     Baseline  Unable to complete. 08/08/17: pt still unable to perform    Time  8    Period  Weeks    Status  On-going      PT LONG TERM GOAL #5   Title  Patient will report worst pain of no more than 1/10 with shifting gears or lifting her pocket up to demonstrate improved tolerance for ADLs.     Baseline   Moderate to severe pain and inability to complete.     Time  6    Period  Weeks    Status  New            Plan - 11/08/17 1458    Clinical Impression Statement  Patient has a positive response to dry needling and soft tissue work performed this date. She arrived late to session, thus clinical time was limited, however no immediate complications were noted and patient reported significant decrease in pain this date.     Clinical Presentation  Stable    Clinical Decision Making  Moderate    Rehab Potential  Fair    PT Frequency  2x / week    PT Duration  6 weeks    PT Treatment/Interventions  Aquatic Therapy;Moist Heat;Cryotherapy;Electrical Stimulation;Ultrasound;Therapeutic exercise;Therapeutic activities;Gait training;Stair training;Neuromuscular re-education;Manual techniques;Taping;Patient/family education    PT Next Visit Plan  Progress dynamic functional balance and generalized strengthening.     PT Home Exercise Plan  Supine bridging, mini squats, standing hip abductions with yellow t-bands, side stepping with yellow t-band    Consulted and Agree with Plan of Care  Patient       Patient will benefit from skilled therapeutic intervention in order to improve the following deficits and impairments:  Abnormal gait, Decreased activity tolerance, Decreased balance, Decreased endurance, Decreased strength, Difficulty walking, Decreased knowledge of precautions, Decreased mobility, Pain, Decreased range of motion  Visit Diagnosis: Neck pain  Chronic right shoulder pain     Problem List Patient Active Problem List   Diagnosis Date Noted  . Lymphedema 10/11/2017  . Chronic venous insufficiency 10/11/2017  . Leg pain 10/11/2017  . Leg swelling 10/11/2017  . COPD (chronic obstructive pulmonary disease) ([place]) 10/11/2017  . Essential hypertension 10/11/2017   Royce Macadamia PT, DPT, CSCS    11/08/2017, 3:06 PM  [facility] [address], [place], [address] Phone: [phone]   Fax:  [phone]  Name: [name] MRN: [id number] Date of Birth: [birth year]

## 2017-11-09 ENCOUNTER — Encounter: Payer: Medicare Other | Admitting: Physical Therapy

## 2017-11-09 ENCOUNTER — Ambulatory Visit: Payer: Medicare Other | Admitting: Physical Therapy

## 2017-11-09 DIAGNOSIS — M542 Cervicalgia: Secondary | ICD-10-CM

## 2017-11-09 DIAGNOSIS — R262 Difficulty in walking, not elsewhere classified: Secondary | ICD-10-CM | POA: Diagnosis not present

## 2017-11-09 DIAGNOSIS — M25511 Pain in right shoulder: Secondary | ICD-10-CM

## 2017-11-09 DIAGNOSIS — G8929 Other chronic pain: Secondary | ICD-10-CM

## 2017-11-09 DIAGNOSIS — M25512 Pain in left shoulder: Secondary | ICD-10-CM

## 2017-11-09 NOTE — Therapy (Signed)
[facility] [address], [place], [address] Phone: [phone]   Fax:  [phone]  Physical Therapy Treatment  Patient Details  Name: [name] MRN: [id number] Date of Birth: [birth year] Referring Provider: Dr. Prince Rome   Encounter Date: 11/09/2017  PT End of Session - 11/09/17 1324    Visit Number  33    Number of Visits  35    Date for PT Re-Evaluation  11/27/17    PT Start Time  3662    PT Stop Time  1348    PT Time Calculation (min)  40 min    Activity Tolerance  Patient tolerated treatment well    Behavior During Therapy  [facility] for tasks assessed/performed       Past Medical History:  Diagnosis Date  . Anemia   . GERD (gastroesophageal reflux disease)   . Hypertension   . Syncope     No past surgical history on file.  There were no vitals filed for this visit.  Subjective Assessment - 11/09/17 1309    Subjective  Patient reports she still gets pain with reaching out. She is still able to lift her shoulder, but is getting discomfort/pain from her anterior shoulder down through her biceps. She has not found any benefit from this bout of dry needling in the biceps. Lifting her R arm up does not hurt,it does hurt when she reaches forward.     Pertinent History  She had a previous cervical surgery when she was 79, has 3 surgeries in her lumbar spine.     Limitations  Walking;House hold activities    Patient Stated Goals  To be able to walk her dog around the neighborhood and be able to garden.     Currently in Pain?  Other (Comment)    Pain Location  Arm    Pain Orientation  Right    Pain Descriptors / Indicators  Aching    Pain Type  Chronic pain    Pain Onset  More than a month ago       Supine chest press with 2# x 12, 3# x 12 for 2 sets 1# x 15 repetitions through full ROM  Mild loss of IR (15-20 degrees), mild loss of ER (10 degrees deficit from 90)   IR in sitting isometrics x 12  repetitions for 2" holds (tolerable, no pain reported) (External rotation for 3 sets as well)  Seated rows with 15# x 8 repetitions for 3 sets   Seated chest press on OMEGA with 10# x 12 repetitions for 1st set, 2nd set - 8 repetitions, 3rd set x 10 repetitions   Sitting ER with red t-band x 12 repetitions x 3 sets                        PT Education - 11/09/17 1342    Education provided  Yes    Education Details  Will continue to emphasize ER strengthening and begin pressing exercises for strengthening to allow her to reach forward without as much discomfort.     Person(s) Educated  Patient    Methods  Explanation;Demonstration    Comprehension  Verbalized understanding;Returned demonstration          PT Long Term Goals - 09/28/17 1416      PT LONG TERM GOAL #1   Title  Patient will be able to demonstrate sit to stand without use of UEs demonstrating improved  functional LE strength.     Baseline  Unable to stand without use of UEs; 08/08/17: unable to perform without UE support    Time  8    Period  Weeks    Status  Achieved      PT LONG TERM GOAL #2   Title  Patient will demonstrate FGA of at least 12/30 to demonstrate improved dynamic balance to demonstrate improved safety with community mobility.     Baseline  7/30 at initial evaluation; 08/08/17: 10/30    Time  8    Period  Weeks      PT LONG TERM GOAL #3   Title  Patient will demonstrate 5x sit to stand and TUG score of less than 17" to demonstrate improved safety with ambulation.     Baseline  08/08/17: TUG: 16.6s; 5TSTS: unable to perform    Period  Weeks    Status  Partially Met      PT LONG TERM GOAL #4   Title  Patient will be able to ambulate around her neighborhood with her dog to return to previous level of activity.     Baseline  Unable to complete. 08/08/17: pt still unable to perform    Time  8    Period  Weeks    Status  On-going      PT LONG TERM GOAL #5   Title  Patient will report  worst pain of no more than 1/10 with shifting gears or lifting her pocket up to demonstrate improved tolerance for ADLs.     Baseline  Moderate to severe pain and inability to complete.     Time  6    Period  Weeks    Status  New            Plan - Nov 17, 2017 1324    Clinical Impression Statement  Patient will have orthopedist evaluate her R shoulder tomorrow, she continues to have anterior shoulder pain with reaching, likely related to weakness and old R biceps injury. She appeared to tolerate resisted pushing activities well this date, and will be progressed in anterior pushing movement strengthening so long as she does not have any increase in pain over the next few days.     Clinical Presentation  Stable    Clinical Decision Making  Moderate    Rehab Potential  Fair    PT Frequency  2x / week    PT Duration  6 weeks    PT Treatment/Interventions  Aquatic Therapy;Moist Heat;Cryotherapy;Electrical Stimulation;Ultrasound;Therapeutic exercise;Therapeutic activities;Gait training;Stair training;Neuromuscular re-education;Manual techniques;Taping;Patient/family education    PT Next Visit Plan  Progress dynamic functional balance and generalized strengthening.     PT Home Exercise Plan  Supine bridging, mini squats, standing hip abductions with yellow t-bands, side stepping with yellow t-band    Consulted and Agree with Plan of Care  Patient       Patient will benefit from skilled therapeutic intervention in order to improve the following deficits and impairments:  Abnormal gait, Decreased activity tolerance, Decreased balance, Decreased endurance, Decreased strength, Difficulty walking, Decreased knowledge of precautions, Decreased mobility, Pain, Decreased range of motion  Visit Diagnosis: Neck pain  Chronic right shoulder pain  Chronic left shoulder pain   G-Codes - 2017/11/17 1540    Functional Assessment Tool Used (Outpatient Only)  Pain report and ROM provided by patient      Functional Limitation  Carrying, moving and handling objects    Carrying, Moving and Handling Objects Current Status (J6811)  At  least 1 percent but less than 20 percent impaired, limited or restricted    Carrying, Moving and Handling Objects Goal Status (Y6063)  At least 1 percent but less than 20 percent impaired, limited or restricted       Problem List Patient Active Problem List   Diagnosis Date Noted  . Lymphedema 10/11/2017  . Chronic venous insufficiency 10/11/2017  . Leg pain 10/11/2017  . Leg swelling 10/11/2017  . COPD (chronic obstructive pulmonary disease) ([place]) 10/11/2017  . Essential hypertension 10/11/2017    Royce Macadamia PT, DPT, CSCS    11/09/2017, 3:41 PM  [facility] [address], [place], [address] Phone: [phone]   Fax:  [phone]  Name: [name] MRN: [id number] Date of Birth: Feb 21, 1930

## 2017-11-09 NOTE — Patient Instructions (Addendum)
Supine chest press with 2# x 12, 3# x 12 for 2 sets 1# x 15 repetitions through full ROM  Mild loss of IR (15-20 degrees), mild loss of ER (10 degrees deficit from 90)   IR in sitting isometrics x 12 repetitions for 2" holds (tolerable, no pain reported) (External rotation for 3 sets as well)  Seated rows with 15# x 8 repetitions for 3 sets   Seated chest press on OMEGA with 10# x 12 repetitions for 1st set, 2nd set - 8 repetitions, 3rd set x 10 repetitions   Sitting ER with red t-band x 12 repetitions x 3 sets

## 2017-11-14 ENCOUNTER — Encounter: Payer: Medicare Other | Attending: Physician Assistant | Admitting: Physician Assistant

## 2017-11-14 ENCOUNTER — Encounter: Payer: Medicare Other | Admitting: Physical Therapy

## 2017-11-14 DIAGNOSIS — M109 Gout, unspecified: Secondary | ICD-10-CM | POA: Insufficient documentation

## 2017-11-14 DIAGNOSIS — X58XXXA Exposure to other specified factors, initial encounter: Secondary | ICD-10-CM | POA: Insufficient documentation

## 2017-11-14 DIAGNOSIS — M1990 Unspecified osteoarthritis, unspecified site: Secondary | ICD-10-CM | POA: Insufficient documentation

## 2017-11-14 DIAGNOSIS — G629 Polyneuropathy, unspecified: Secondary | ICD-10-CM | POA: Insufficient documentation

## 2017-11-14 DIAGNOSIS — Z885 Allergy status to narcotic agent status: Secondary | ICD-10-CM | POA: Insufficient documentation

## 2017-11-14 DIAGNOSIS — Z880 Allergy status to penicillin: Secondary | ICD-10-CM | POA: Diagnosis not present

## 2017-11-14 DIAGNOSIS — S81811A Laceration without foreign body, right lower leg, initial encounter: Secondary | ICD-10-CM | POA: Diagnosis not present

## 2017-11-14 DIAGNOSIS — I10 Essential (primary) hypertension: Secondary | ICD-10-CM | POA: Insufficient documentation

## 2017-11-14 DIAGNOSIS — L97812 Non-pressure chronic ulcer of other part of right lower leg with fat layer exposed: Secondary | ICD-10-CM | POA: Diagnosis not present

## 2017-11-15 NOTE — Progress Notes (Signed)
SHAHD, OCCHIPINTI ([id number]) Visit Report for 11/14/2017 Abuse/Suicide Risk Screen Details Patient Name: [name], [name] Date of Service: 11/14/2017 12:30 PM Medical Record Number: [id number] Patient Account Number: [id number] Date of Birth/Sex: 1930-07-04 (83 y.o. Female) Treating RN: Renne Crigler Primary Care Shray Hunley: Aram Beecham Other Clinician: Referring Kekai Geter: Referral, Self Treating Leshay Desaulniers/Extender: Linwood Dibbles, HOYT Weeks in Treatment: 0 Abuse/Suicide Risk Screen Items Answer ABUSE/SUICIDE RISK SCREEN: Has anyone close to you tried to hurt or harm you recentlyo No Do you feel uncomfortable with anyone in your familyo No Has anyone forced you do things that you didnot want to doo No Do you have any thoughts of harming yourselfo No Patient displays signs or symptoms of abuse and/or neglect. No Electronic Signature(s) Signed: 11/14/2017 1:56:40 PM By: Renne Crigler Entered By: Renne Crigler on 11/14/2017 12:53:05 [name] ([id number]) -------------------------------------------------------------------------------- Activities of Daily Living Details Patient Name: [name], [name] Date of Service: 11/14/2017 12:30 PM Medical Record Number: [id number] Patient Account Number: [id number] Date of Birth/Sex: November 11, 1930 (83 y.o. Female) Treating RN: Renne Crigler Primary Care Tejon Gracie: Aram Beecham Other Clinician: Referring Asiya Cutbirth: Referral, Self Treating Sevastian Witczak/Extender: Linwood Dibbles, HOYT Weeks in Treatment: 0 Activities of Daily Living Items Answer Activities of Daily Living (Please select one for each item) Drive Automobile Completely Able Take Medications Completely Able Use Telephone Completely Able Care for Appearance Completely Able Use Toilet Completely Able Bath / Shower Completely Able Dress Self Completely Able Feed Self Completely Able Walk Completely Able Get In / Out Bed Completely Able Housework Completely Able Prepare Meals  Completely Able Handle Money Completely Able Shop for Self Completely Able Electronic Signature(s) Signed: 11/14/2017 1:56:40 PM By: Renne Crigler Entered By: Renne Crigler on 11/14/2017 12:53:29 [name] ([id number]) -------------------------------------------------------------------------------- Education Assessment Details Patient Name: [name] Date of Service: 11/14/2017 12:30 PM Medical Record Number: [id number] Patient Account Number: [id number] Date of Birth/Sex: December 14, 1929 (83 y.o. Female) Treating RN: Renne Crigler Primary Care Tempest Frankland: Aram Beecham Other Clinician: Referring Jael Waldorf: Referral, Self Treating Blakeley Margraf/Extender: Skeet Simmer in Treatment: 0 Primary Learner Assessed: Patient Learning Preferences/Education Level/Primary Language Learning Preference: Explanation Highest Education Level: College or Above Preferred Language: English Cognitive Barrier Assessment/Beliefs Language Barrier: No Translator Needed: No Memory Deficit: No Emotional Barrier: No Cultural/Religious Beliefs Affecting Medical Care: No Physical Barrier Assessment Impaired Vision: Yes Glasses, Contacts Impaired Hearing: No Decreased Hand dexterity: No Knowledge/Comprehension Assessment Knowledge Level: High Comprehension Level: High Ability to understand written High instructions: Ability to understand verbal High instructions: Motivation Assessment Anxiety Level: Calm Cooperation: Cooperative Education Importance: Acknowledges Need Interest in Health Problems: Asks Questions Perception: Coherent Willingness to Engage in Self- High Management Activities: Readiness to Engage in Self- High Management Activities: Electronic Signature(s) Signed: 11/14/2017 1:56:40 PM By: Renne Crigler Entered By: Renne Crigler on 11/14/2017 12:54:14 [name]  ([id number]) -------------------------------------------------------------------------------- Fall Risk Assessment Details Patient Name: [name] Date of Service: 11/14/2017 12:30 PM Medical Record Number: [id number] Patient Account Number: [id number] Date of Birth/Sex: 1930-10-05 (83 y.o. Female) Treating RN: Renne Crigler Primary Care Adaya Garmany: Aram Beecham Other Clinician: Referring Azarian Starace: Referral, Self Treating Lorna Strother/Extender: Linwood Dibbles, HOYT Weeks in Treatment: 0 Fall Risk Assessment Items Have you had 2 or more falls in the last 12 monthso 0 No Have you had any fall that resulted in injury in the last 12 monthso 0 No FALL RISK ASSESSMENT: History of falling - immediate or within 3 months 0 No Secondary diagnosis 0 No Ambulatory aid None/bed rest/wheelchair/nurse 0 No Crutches/cane/walker 0 No  Furniture 0 No IV Access/Saline Lock 0 No Gait/Training Normal/bed rest/immobile 0 No Weak 0 No Impaired 0 No Mental Status Oriented to own ability 0 No Electronic Signature(s) Signed: 11/14/2017 1:56:40 PM By: Renne Crigler Entered By: Renne Crigler on 11/14/2017 12:54:31 [name] ([id number]) -------------------------------------------------------------------------------- Foot Assessment Details Patient Name: [name] Date of Service: 11/14/2017 12:30 PM Medical Record Number: [id number] Patient Account Number: [id number] Date of Birth/Sex: 05/21/1930 (83 y.o. Female) Treating RN: Renne Crigler Primary Care Cristela Stalder: Aram Beecham Other Clinician: Referring Junette Bernat: Referral, Self Treating Davonne Baby/Extender: Linwood Dibbles, HOYT Weeks in Treatment: 0 Foot Assessment Items Site Locations + = Sensation present, - = Sensation absent, C = Callus, U = Ulcer R = Redness, W = Warmth, M = Maceration, PU = Pre-ulcerative lesion F = Fissure, S = Swelling, D = Dryness Assessment Right: Left: Other Deformity: No No Prior Foot Ulcer: No No Prior  Amputation: No No Charcot Joint: No No Ambulatory Status: Ambulatory With Help Assistance Device: Cane Gait: Steady Electronic Signature(s) Signed: 11/14/2017 1:56:40 PM By: Renne Crigler Entered By: Renne Crigler on 11/14/2017 12:56:41 [name] ([id number]) -------------------------------------------------------------------------------- Nutrition Risk Assessment Details Patient Name: [name] Date of Service: 11/14/2017 12:30 PM Medical Record Number: [id number] Patient Account Number: [id number] Date of Birth/Sex: 1930-04-22 (83 y.o. Female) Treating RN: Renne Crigler Primary Care Kamani Magnussen: Aram Beecham Other Clinician: Referring Kerin Cecchi: Referral, Self Treating Timarion Agcaoili/Extender: Linwood Dibbles, HOYT Weeks in Treatment: 0 Height (in): 60 Weight (lbs): 123 Body Mass Index (BMI): 24 Nutrition Risk Assessment Items NUTRITION RISK SCREEN: I have an illness or condition that made me change the kind and/or amount of 0 No food I eat I eat fewer than two meals per day 0 No I eat few fruits and vegetables, or milk products 0 No I have three or more drinks of beer, liquor or wine almost every day 0 No I have tooth or mouth problems that make it hard for me to eat 0 No I don't always have enough money to buy the food I need 0 No I eat alone most of the time 0 No I take three or more different prescribed or over-the-counter drugs a day 0 No Without wanting to, I have lost or gained 10 pounds in the last six months 0 No I am not always physically able to shop, cook and/or feed myself 0 No Nutrition Protocols Good Risk Protocol 0 No interventions needed Moderate Risk Protocol Electronic Signature(s) Signed: 11/14/2017 1:56:40 PM By: Renne Crigler Entered By: Renne Crigler on 11/14/2017 12:56:01

## 2017-11-16 ENCOUNTER — Ambulatory Visit: Payer: Medicare Other | Attending: Specialist | Admitting: Physical Therapy

## 2017-11-16 DIAGNOSIS — M6281 Muscle weakness (generalized): Secondary | ICD-10-CM | POA: Insufficient documentation

## 2017-11-16 DIAGNOSIS — G8929 Other chronic pain: Secondary | ICD-10-CM | POA: Insufficient documentation

## 2017-11-16 DIAGNOSIS — R2681 Unsteadiness on feet: Secondary | ICD-10-CM | POA: Insufficient documentation

## 2017-11-16 DIAGNOSIS — R2689 Other abnormalities of gait and mobility: Secondary | ICD-10-CM | POA: Insufficient documentation

## 2017-11-16 DIAGNOSIS — M25511 Pain in right shoulder: Secondary | ICD-10-CM | POA: Insufficient documentation

## 2017-11-16 NOTE — Therapy (Signed)
[facility] [address], [place], [address] Phone: [phone]   Fax:  [phone]  Physical Therapy Treatment  Patient Details  Name: [name] MRN: [id number] Date of Birth: 1930/04/01 Referring Provider: Dr. Prince Rome   Encounter Date: 11/16/2017    Past Medical History:  Diagnosis Date  . Anemia   . GERD (gastroesophageal reflux disease)   . Hypertension   . Syncope     No past surgical history on file.  There were no vitals filed for this visit.                                PT Long Term Goals - 09/28/17 1416      PT LONG TERM GOAL #1   Title  Patient will be able to demonstrate sit to stand without use of UEs demonstrating improved functional LE strength.     Baseline  Unable to stand without use of UEs; 08/08/17: unable to perform without UE support    Time  8    Period  Weeks    Status  Achieved      PT LONG TERM GOAL #2   Title  Patient will demonstrate FGA of at least 12/30 to demonstrate improved dynamic balance to demonstrate improved safety with community mobility.     Baseline  7/30 at initial evaluation; 08/08/17: 10/30    Time  8    Period  Weeks      PT LONG TERM GOAL #3   Title  Patient will demonstrate 5x sit to stand and TUG score of less than 17" to demonstrate improved safety with ambulation.     Baseline  08/08/17: TUG: 16.6s; 5TSTS: unable to perform    Period  Weeks    Status  Partially Met      PT LONG TERM GOAL #4   Title  Patient will be able to ambulate around her neighborhood with her dog to return to previous level of activity.     Baseline  Unable to complete. 08/08/17: pt still unable to perform    Time  8    Period  Weeks    Status  On-going      PT LONG TERM GOAL #5   Title  Patient will report worst pain of no more than 1/10 with shifting gears or lifting her pocket up to demonstrate improved tolerance for ADLs.     Baseline  Moderate to severe pain and inability to complete.     Time  6    Period  Weeks    Status  New            Plan - 11/16/17 1206    Clinical Impression Statement  Patient reports she had a L shoulder injection over the last week and is very satisfied with her results. She would like a similar procedure for her R shoulder, and after seeing her neurologist yesterday she would like to start working on her balance and LE strengthening again. Will discontinue/complete current order for her shoulder and get order for LE strengthening/.balance from her PCP.     Clinical Presentation  Stable    Clinical Decision Making  Moderate    Rehab Potential  Fair    PT Frequency  2x / week    PT Duration  6 weeks    PT Treatment/Interventions  Aquatic Therapy;Moist Heat;Cryotherapy;Electrical Stimulation;Ultrasound;Therapeutic exercise;Therapeutic activities;Gait training;Stair training;Neuromuscular  re-education;Manual techniques;Taping;Patient/family education    PT Next Visit Plan  Progress dynamic functional balance and generalized strengthening.     PT Home Exercise Plan  Supine bridging, mini squats, standing hip abductions with yellow t-bands, side stepping with yellow t-band    Consulted and Agree with Plan of Care  Patient       Patient will benefit from skilled therapeutic intervention in order to improve the following deficits and impairments:  Abnormal gait, Decreased activity tolerance, Decreased balance, Decreased endurance, Decreased strength, Difficulty walking, Decreased knowledge of precautions, Decreased mobility, Pain, Decreased range of motion  Visit Diagnosis: Chronic right shoulder pain     Problem List Patient Active Problem List   Diagnosis Date Noted  . Lymphedema 10/11/2017  . Chronic venous insufficiency 10/11/2017  . Leg pain 10/11/2017  . Leg swelling 10/11/2017  . COPD (chronic obstructive pulmonary disease) ([place]) 10/11/2017  . Essential hypertension  10/11/2017   Royce Macadamia PT, DPT, CSCS    11/16/2017, 12:10 PM  [facility] [address] [place], [address] Phone: [phone]   Fax:  [phone]  Name: [name] MRN: [id number] Date of Birth: Mar 15, 1930

## 2017-11-20 ENCOUNTER — Ambulatory Visit: Payer: Medicare Other | Admitting: Physical Therapy

## 2017-11-21 ENCOUNTER — Ambulatory Visit: Payer: Medicare Other | Admitting: Internal Medicine

## 2017-11-21 NOTE — Progress Notes (Signed)
[name], [name] ([id number]) Visit Report for 11/14/2017 Allergy List Details Patient Name: [name], [name] Date of Service: 11/14/2017 12:30 PM Medical Record Number: [id number] Patient Account Number: [id number] Date of Birth/Sex: 02/16/1930 (83 y.o. Female) Treating RN: Renne CriglerFlinchum, Cheryl Primary Care Kiaria Quinnell: Aram BeechamSparks, Jeffrey Other Clinician: Referring Ashlyne Olenick: Referral, Self Treating Rees Matura/Extender: STONE III, HOYT Weeks in Treatment: 0 Allergies Active Allergies Penicillins Reaction: rash Severity: Moderate Sporanox Reaction: elevated LFT Severity: Moderate adhesive Reaction: skin alergy Severity: Moderate codeine Reaction: n/v Severity: Moderate Cipro Reaction: diarrhea Severity: Moderate bioxin Reaction: upset stomach doxycycline Reaction: flu like systems Allergy Notes Electronic Signature(s) Signed: 11/14/2017 1:56:40 PM By: Renne CriglerFlinchum, Cheryl Entered By: Renne CriglerFlinchum, Cheryl on 11/14/2017 13:29:37 [name], [name] ([id number]) -------------------------------------------------------------------------------- Arrival Information Details Patient Name: [name], [name] Date of Service: 11/14/2017 12:30 PM Medical Record Number: [id number] Patient Account Number: [id number] Date of Birth/Sex: 10/07/1930 (83 y.o. Female) Treating RN: Renne CriglerFlinchum, Cheryl Primary Care Vartan Kerins: Aram BeechamSparks, Jeffrey Other Clinician: Referring Preslyn Warr: Referral, Self Treating Layken Beg/Extender: Linwood DibblesSTONE III, HOYT Weeks in Treatment: 0 Visit Information Patient Arrived: Cane Arrival Time: 12:43 Accompanied By: self Transfer Assistance: None Patient Identification Verified: Yes Secondary Verification Process Completed: Yes Patient Requires Transmission-Based Precautions: No Patient Has Alerts: No History Since Last Visit All ordered tests and consults were completed: No Added or deleted any medications: No Had a fall or experienced change in activities of daily living that may affect risk of  falls: No Signs or symptoms of abuse/neglect since last visito No Hospitalized since last visit: No Electronic Signature(s) Signed: 11/14/2017 1:56:40 PM By: Renne CriglerFlinchum, Cheryl Entered By: Renne CriglerFlinchum, Cheryl on 11/14/2017 12:45:02 [name], [name] ([id number]) -------------------------------------------------------------------------------- Encounter Discharge Information Details Patient Name: [name], [name] Date of Service: 11/14/2017 12:30 PM Medical Record Number: [id number] Patient Account Number: [id number] Date of Birth/Sex: 05/11/1930 (83 y.o. Female) Treating RN: Renne CriglerFlinchum, Cheryl Primary Care Yan Pankratz: Aram BeechamSparks, Jeffrey Other Clinician: Referring Shellie Goettl: Referral, Self Treating Laine Giovanetti/Extender: Linwood DibblesSTONE III, HOYT Weeks in Treatment: 0 Encounter Discharge Information Items Discharge Pain Level: 2 Discharge Condition: Stable Ambulatory Status: Cane Discharge Destination: Home Transportation: Private Auto Accompanied By: self Schedule Follow-up Appointment: Yes Medication Reconciliation completed and No provided to Patient/Care Oluwatobi Visser: Provided on Clinical Summary of Care: 11/14/2017 Form Type Recipient Paper [name] Electronic Signature(s) Signed: 11/14/2017 1:56:40 PM By: Renne CriglerFlinchum, Cheryl Entered By: Renne CriglerFlinchum, Cheryl on 11/14/2017 13:32:06 [name], [name] ([id number]) -------------------------------------------------------------------------------- Lower Extremity Assessment Details Patient Name: [name], [name] Date of Service: 11/14/2017 12:30 PM Medical Record Number: [id number] Patient Account Number: [id number] Date of Birth/Sex: 12/10/1930 (83 y.o. Female) Treating RN: Renne CriglerFlinchum, Cheryl Primary Care Kriti Katayama: Aram BeechamSparks, Jeffrey Other Clinician: Referring Lataja Newland: Referral, Self Treating Blakelyn Dinges/Extender: Linwood DibblesSTONE III, HOYT Weeks in Treatment: 0 Edema Assessment Assessed: [Left: No] [Right: No] Edema: [Left: N] [Right: o] Calf Left: Right: Point of Measurement: 30  cm From Medial Instep cm 33.7 cm Ankle Left: Right: Point of Measurement: 10 cm From Medial Instep cm 20.2 cm Vascular Assessment Pulses: Dorsalis Pedis Palpable: [Right:Yes] Posterior Tibial Extremity colors, hair growth, and conditions: Extremity Color: [Right:Hyperpigmented] Hair Growth on Extremity: [Right:Yes] Temperature of Extremity: [Right:Warm] Capillary Refill: [Right:> 3 seconds] Blood Pressure: Brachial: [Right:120] Dorsalis Pedis: [Left:Dorsalis Pedis: 170] Ankle: Posterior Tibial: [Left:Posterior Tibial: 178] [Right:1.48] Toe Nail Assessment Left: Right: Thick: No Discolored: No Deformed: No Improper Length and Hygiene: No Notes right heel to knee 42 cm Electronic Signature(s) Signed: 11/14/2017 1:56:40 PM By: Renne CriglerFlinchum, Cheryl Entered By: Renne CriglerFlinchum, Cheryl on 11/14/2017 13:29:28 [name], [name] ([id number]) -------------------------------------------------------------------------------- Multi Wound Chart Details Patient Name: [name], [name]  Date of Service: 11/14/2017 12:30 PM Medical Record Number: [id number] Patient Account Number: [id number] Date of Birth/Sex: May 03, 1930 (83 y.o. Female) Treating RN: Renne Crigler Primary Care Price Lachapelle: Aram Beecham Other Clinician: Referring Birttany Dechellis: Referral, Self Treating Bunny Lowdermilk/Extender: Linwood Dibbles, HOYT Weeks in Treatment: 0 Vital Signs Height(in): 60 Pulse(bpm): 71 Weight(lbs): 123 Blood Pressure(mmHg): 93/70 Body Mass Index(BMI): 24 Temperature(F): 98.4 Respiratory Rate 20 (breaths/min): Photos: [5:No Photos] [N/A:N/A] Wound Location: [5:Right Lower Leg - Anterior] [N/A:N/A] Wounding Event: [5:Trauma] [N/A:N/A] Primary Etiology: [5:Trauma, Other] [N/A:N/A] Date Acquired: [5:09/05/2017] [N/A:N/A] Weeks of Treatment: [5:0] [N/A:N/A] Wound Status: [5:Open] [N/A:N/A] Measurements L x W x D [5:1.2x1.4x0.1] [N/A:N/A] (cm) Area (cm) : [5:1.319] [N/A:N/A] Volume (cm) : [5:0.132]  [N/A:N/A] Classification: [5:Partial Thickness] [N/A:N/A] Exudate Amount: [5:Large] [N/A:N/A] Exudate Type: [5:Serosanguineous] [N/A:N/A] Exudate Color: [5:red, brown] [N/A:N/A] Wound Margin: [5:Distinct, outline attached] [N/A:N/A] Granulation Amount: [5:Medium (34-66%)] [N/A:N/A] Granulation Quality: [5:Red] [N/A:N/A] Necrotic Amount: [5:Medium (34-66%)] [N/A:N/A] Exposed Structures: [5:Fascia: No Fat Layer (Subcutaneous Tissue) Exposed: No Tendon: No Muscle: No Joint: No Bone: No] [N/A:N/A] Epithelialization: [5:None] [N/A:N/A] Debridement: [5:Debridement (11042-11047)] [N/A:N/A] Pre-procedure [5:13:17] [N/A:N/A] Verification/Time Out Taken: Pain Control: [5:Lidocaine 4% Topical Solution] [N/A:N/A] Tissue Debrided: [5:Fibrin/Slough, Exudates, Subcutaneous] [N/A:N/A] Level: [5:Skin/Subcutaneous Tissue] [N/A:N/A] Debridement Area (sq cm): 1.68 [N/A:N/A] Instrument: [5:Curette] [N/A:N/A] Bleeding: [5:Minimum] [N/A:N/A] Hemostasis Achieved: Pressure N/A N/A Procedural Pain: 0 N/A N/A Post Procedural Pain: 0 N/A N/A Debridement Treatment Procedure was tolerated well N/A N/A Response: Post Debridement 1.2x1.4x0.2 N/A N/A Measurements L x W x D (cm) Post Debridement Volume: 0.264 N/A N/A (cm) Periwound Skin Texture: No Abnormalities Noted N/A N/A Periwound Skin Moisture: No Abnormalities Noted N/A N/A Periwound Skin Color: Erythema: Yes N/A N/A Erythema Location: Circumferential N/A N/A Temperature: No Abnormality N/A N/A Tenderness on Palpation: Yes N/A N/A Wound Preparation: Ulcer Cleansing: N/A N/A Rinsed/Irrigated with Saline Topical Anesthetic Applied: Other: lidocaine 45 Procedures Performed: Debridement N/A N/A Treatment Notes Electronic Signature(s) Signed: 11/14/2017 1:56:40 PM By: Renne Crigler Entered By: Renne Crigler on 11/14/2017 13:30:09 Natasha Basset  ([id number]) -------------------------------------------------------------------------------- Multi-Disciplinary Care Plan Details Patient Name: Natasha Basset Date of Service: 11/14/2017 12:30 PM Medical Record Number: [id number] Patient Account Number: [id number] Date of Birth/Sex: 1930/09/14 (83 y.o. Female) Treating RN: Renne Crigler Primary Care Phineas Mcenroe: Aram Beecham Other Clinician: Referring Elmire Amrein: Referral, Self Treating Kyeisha Janowicz/Extender: Linwood Dibbles, HOYT Weeks in Treatment: 0 Active Inactive ` Orientation to the Wound Care Program Nursing Diagnoses: Knowledge deficit related to the wound healing center program Goals: Patient/caregiver will verbalize understanding of the Wound Healing Center Program Date Initiated: 11/14/2017 Target Resolution Date: 11/28/2017 Goal Status: Active Interventions: Provide education on orientation to the wound center Notes: ` Wound/Skin Impairment Nursing Diagnoses: Impaired tissue integrity Knowledge deficit related to ulceration/compromised skin integrity Goals: Patient/caregiver will verbalize understanding of skin care regimen Date Initiated: 11/14/2017 Target Resolution Date: 11/28/2017 Goal Status: Active Ulcer/skin breakdown will have a volume reduction of 30% by week 4 Date Initiated: 11/14/2017 Target Resolution Date: 11/28/2017 Goal Status: Active Interventions: Assess patient/caregiver ability to obtain necessary supplies Assess patient/caregiver ability to perform ulcer/skin care regimen upon admission and as needed Assess ulceration(s) every visit Treatment Activities: Skin care regimen initiated : 11/14/2017 Notes: Electronic Signature(s) Signed: 11/14/2017 1:56:40 PM By: Lenore Cordia, Lawernce Keas ([id number]) Entered By: Renne Crigler on 11/14/2017 13:29:56 Natasha Basset ([id number]) -------------------------------------------------------------------------------- Pain Assessment Details Patient  Name: Natasha Basset Date of Service: 11/14/2017 12:30 PM Medical Record Number: [id number] Patient Account Number: [id number] Date of Birth/Sex: [birth year] (83 y.o. Female) Treating RN: Renne Crigler Primary  Care Marcey Persad: Aram Beecham Other Clinician: Referring Boen Sterbenz: Referral, Self Treating Denard Tuminello/Extender: Linwood Dibbles, HOYT Weeks in Treatment: 0 Active Problems Location of Pain Severity and Description of Pain Patient Has Paino No Site Locations Pain Management and Medication Current Pain Management: Electronic Signature(s) Signed: 11/14/2017 1:56:40 PM By: Renne Crigler Entered By: Renne Crigler on 11/14/2017 12:45:08 Natasha Basset ([id number]) -------------------------------------------------------------------------------- Patient/Caregiver Education Details Patient Name: Natasha Basset Date of Service: 11/14/2017 12:30 PM Medical Record Number: [id number] Patient Account Number: [id number] Date of Birth/Gender: 11-Aug-1930 (83 y.o. Female) Treating RN: Renne Crigler Primary Care Physician: Aram Beecham Other Clinician: Referring Physician: Referral, Self Treating Physician/Extender: Skeet Simmer in Treatment: 0 Education Assessment Education Provided To: Patient Education Topics Provided Wound Debridement: Handouts: Wound Debridement Methods: Explain/Verbal Responses: State content correctly Wound/Skin Impairment: Handouts: Caring for Your Ulcer Methods: Explain/Verbal Responses: State content correctly Electronic Signature(s) Signed: 11/14/2017 1:56:40 PM By: Renne Crigler Entered By: Renne Crigler on 11/14/2017 13:32:24 Natasha Basset ([id number]) -------------------------------------------------------------------------------- Wound Assessment Details Patient Name: Natasha Basset Date of Service: 11/14/2017 12:30 PM Medical Record Number: [id number] Patient Account Number: [id number] Date of Birth/Sex: [birth year] (83 y.o.  Female) Treating RN: Renne Crigler Primary Care Bryauna Byrum: Aram Beecham Other Clinician: Referring Aydian Dimmick: Referral, Self Treating Stephaie Dardis/Extender: Linwood Dibbles, HOYT Weeks in Treatment: 0 Wound Status Wound Number: 5 Primary Etiology: Trauma, Other Wound Location: Right Lower Leg - Anterior Wound Status: Open Wounding Event: Trauma Comorbid Hypertension, Gout, Osteoarthritis, History: Neuropathy Date Acquired: 09/05/2017 Weeks Of Treatment: 0 Clustered Wound: No Photos Wound Measurements Length: (cm) 1.2 Width: (cm) 1.4 Depth: (cm) 0.1 Area: (cm) 1.319 Volume: (cm) 0.132 % Reduction in Area: 0% % Reduction in Volume: 0% Epithelialization: None Tunneling: No Undermining: No Wound Description Full Thickness Without Exposed Support Foul Odo Classification: Structures Slough/F Wound Margin: Distinct, outline attached Exudate Large Amount: Exudate Type: Serosanguineous Exudate Color: red, brown r After Cleansing: No ibrino Yes Wound Bed Granulation Amount: Medium (34-66%) Exposed Structure Granulation Quality: Red Fascia Exposed: No Necrotic Amount: Medium (34-66%) Fat Layer (Subcutaneous Tissue) Exposed: No Necrotic Quality: Adherent Slough Tendon Exposed: No Muscle Exposed: No Joint Exposed: No Bone Exposed: No Periwound Skin Texture TASHEBA, SELLARDS ([id number]) Texture Color No Abnormalities Noted: No No Abnormalities Noted: No Erythema: Yes Moisture Erythema Location: Circumferential No Abnormalities Noted: No Temperature / Pain Temperature: No Abnormality Tenderness on Palpation: Yes Wound Preparation Ulcer Cleansing: Rinsed/Irrigated with Saline Topical Anesthetic Applied: Other: lidocaine 45, Treatment Notes Wound #5 (Right, Anterior Lower Leg) 1. Cleansed with: Clean wound with Normal Saline 2. Anesthetic Topical Lidocaine 4% cream to wound bed prior to debridement 4. Dressing Applied: Santyl Ointment 5. Secondary Dressing  Applied Bordered Foam Dressing Electronic Signature(s) Signed: 11/17/2017 9:09:55 AM By: Curtis Sites Signed: 11/17/2017 4:47:10 PM By: Renne Crigler Previous Signature: 11/14/2017 1:56:24 PM Version By: Renne Crigler Entered By: Curtis Sites on 11/17/2017 09:09:55 Natasha Basset ([id number]) -------------------------------------------------------------------------------- Vitals Details Patient Name: Natasha Basset Date of Service: 11/14/2017 12:30 PM Medical Record Number: [id number] Patient Account Number: [id number] Date of Birth/Sex: May 18, 1930 (83 y.o. Female) Treating RN: Renne Crigler Primary Care Fatmata Legere: Aram Beecham Other Clinician: Referring Mareon Robinette: Referral, Self Treating Naveed Humphres/Extender: Linwood Dibbles, HOYT Weeks in Treatment: 0 Vital Signs Time Taken: 12:45 Temperature (F): 98.4 Height (in): 60 Pulse (bpm): 71 Source: Stated Respiratory Rate (breaths/min): 20 Weight (lbs): 123 Blood Pressure (mmHg): 93/70 Source: Stated Reference Range: 80 - 120 mg / dl Body Mass Index (BMI): 24 Electronic Signature(s) Signed: 11/14/2017 1:56:40 PM By: Renne Crigler  Entered By: Renne Crigler on 11/14/2017 12:46:19

## 2017-11-22 ENCOUNTER — Encounter: Payer: Medicare Other | Admitting: Physical Therapy

## 2017-11-22 ENCOUNTER — Ambulatory Visit: Payer: Medicare Other | Admitting: Physical Therapy

## 2017-11-22 ENCOUNTER — Encounter: Payer: Medicare Other | Admitting: Internal Medicine

## 2017-11-22 DIAGNOSIS — S81811A Laceration without foreign body, right lower leg, initial encounter: Secondary | ICD-10-CM | POA: Diagnosis not present

## 2017-11-25 NOTE — Progress Notes (Signed)
GINGER, LEETH ([id number]) Visit Report for 11/22/2017 Arrival Information Details Patient Name: [name], [name] Date of Service: 11/22/2017 1:30 PM Medical Record Number: [id number] Patient Account Number: [id number] Date of Birth/Sex: 1930-10-30 (83 y.o. Female) Treating RN: Curtis Sites Primary Care Quentez Lober: Aram Beecham Other Clinician: Referring Angeles Zehner: Aram Beecham Treating Alexx Mcburney/Extender: [facility] [place] in Treatment: 1 Visit Information History Since Last Visit Added or deleted any medications: No Patient Arrived: Cane Any new allergies or adverse reactions: No Arrival Time: 13:54 Had a fall or experienced change in No Accompanied By: self activities of daily living that may affect Transfer Assistance: None risk of falls: Patient Identification Verified: Yes Signs or symptoms of abuse/neglect since last visito No Secondary Verification Process Completed: Yes Hospitalized since last visit: No Patient Requires Transmission-Based Precautions: No Has Dressing in Place as Prescribed: Yes Patient Has Alerts: No Pain Present Now: No Electronic Signature(s) Signed: 11/22/2017 5:01:45 PM By: Curtis Sites Entered By: Curtis Sites on 11/22/2017 13:57:23 [name] ([id number]) -------------------------------------------------------------------------------- Encounter Discharge Information Details Patient Name: [name] Date of Service: 11/22/2017 1:30 PM Medical Record Number: [id number] Patient Account Number: [id number] Date of Birth/Sex: 1930/08/05 (83 y.o. Female) Treating RN: Curtis Sites Primary Care Ridley Dileo: Aram Beecham Other Clinician: Referring Xia Stohr: Aram Beecham Treating Monta Police/Extender: [facility] [place] in Treatment: 1 Encounter Discharge Information Items Discharge Pain Level: 0 Discharge Condition: Stable Ambulatory Status: Cane Discharge Destination: Home Transportation: Private Auto Accompanied  By: self Schedule Follow-up Appointment: Yes Medication Reconciliation completed and No provided to Patient/Care Wilkin Lippy: Provided on Clinical Summary of Care: 11/22/2017 Form Type Recipient Paper Patient [facility] Electronic Signature(s) Signed: 11/22/2017 2:38:11 PM By: Curtis Sites Entered By: Curtis Sites on 11/22/2017 14:38:11 [name] ([id number]) -------------------------------------------------------------------------------- Lower Extremity Assessment Details Patient Name: [name] Date of Service: 11/22/2017 1:30 PM Medical Record Number: [id number] Patient Account Number: [id number] Date of Birth/Sex: 09-30-1930 (83 y.o. Female) Treating RN: Curtis Sites Primary Care Tenishia Ekman: Aram Beecham Other Clinician: Referring Kierstynn Babich: Aram Beecham Treating Jishnu Jenniges/Extender: [facility] [place] in Treatment: 1 Edema Assessment Assessed: [Left: No] [Right: No] [Left: Edema] [Right: :] Calf Left: Right: Point of Measurement: 30 cm From Medial Instep cm 34.2 cm Ankle Left: Right: Point of Measurement: 10 cm From Medial Instep cm 21.2 cm Vascular Assessment Pulses: Dorsalis Pedis Palpable: [Right:Yes] Posterior Tibial Palpable: [Right:Yes] Extremity colors, hair growth, and conditions: Extremity Color: [Right:Hyperpigmented] Hair Growth on Extremity: [Right:No] Temperature of Extremity: [Right:Warm] Capillary Refill: [Right:< 3 seconds] Electronic Signature(s) Signed: 11/22/2017 5:01:45 PM By: Curtis Sites Entered By: Curtis Sites on 11/22/2017 14:02:01 [name] ([id number]) -------------------------------------------------------------------------------- Multi Wound Chart Details Patient Name: [name] Date of Service: 11/22/2017 1:30 PM Medical Record Number: [id number] Patient Account Number: [id number] Date of Birth/Sex: Mar 04, 1930 (83 y.o. Female) Treating RN: Curtis Sites Primary Care Jovita Persing: Aram Beecham Other  Clinician: Referring Kesler Wickham: Aram Beecham Treating Chia Mowers/Extender: [facility] [place] in Treatment: 1 Vital Signs Height(in): 60 Pulse(bpm): 84 Weight(lbs): 123 Blood Pressure(mmHg): 114/63 Body Mass Index(BMI): 24 Temperature(F): 98.3 Respiratory Rate 18 (breaths/min): Photos: [5:No Photos] [N/A:N/A] Wound Location: [5:Right Lower Leg - Anterior] [N/A:N/A] Wounding Event: [5:Trauma] [N/A:N/A] Primary Etiology: [5:Trauma, Other] [N/A:N/A] Comorbid History: [5:Hypertension, Gout, Osteoarthritis, Neuropathy] [N/A:N/A] Date Acquired: [5:09/05/2017] [N/A:N/A] Weeks of Treatment: [5:1] [N/A:N/A] Wound Status: [5:Open] [N/A:N/A] Measurements L x W x D [5:1.1x1.2x0.1] [N/A:N/A] (cm) Area (cm) : [5:1.037] [N/A:N/A] Volume (cm) : [5:0.104] [N/A:N/A] % Reduction in Area: [5:21.40%] [N/A:N/A] % Reduction in Volume: [5:21.20%] [N/A:N/A] Classification: [5:Full Thickness With Exposed  Support Structures] [N/A:N/A] Exudate Amount: [5:Large] [N/A:N/A] Exudate Type: [5:Serosanguineous] [N/A:N/A] Exudate Color: [5:red, brown] [N/A:N/A] Wound Margin: [5:Distinct, outline attached] [N/A:N/A] Granulation Amount: [5:Medium (34-66%)] [N/A:N/A] Granulation Quality: [5:Red] [N/A:N/A] Necrotic Amount: [5:Medium (34-66%)] [N/A:N/A] Exposed Structures: [5:Fat Layer (Subcutaneous Tissue) Exposed: Yes Fascia: No Tendon: No Muscle: No Joint: No Bone: No] [N/A:N/A] Epithelialization: [5:None] [N/A:N/A] Debridement: [5:Debridement (11042-11047)] [N/A:N/A] Pre-procedure [5:14:10] [N/A:N/A] Verification/Time Out Taken: Pain Control: [5:Lidocaine 4% Topical Solution] [N/A:N/A] Tissue Debrided: [5:Fibrin/Slough, Subcutaneous] [N/A:N/A] Level: Skin/Subcutaneous Tissue N/A N/A Debridement Area (sq cm): 1.32 N/A N/A Instrument: Curette N/A N/A Bleeding: Minimum N/A N/A Hemostasis Achieved: Pressure N/A N/A Procedural Pain: 0 N/A N/A Post Procedural Pain: 0 N/A N/A Debridement Treatment  Procedure was tolerated well N/A N/A Response: Post Debridement 1.1x1.2x0.2 N/A N/A Measurements L x W x D (cm) Post Debridement Volume: 0.207 N/A N/A (cm) Periwound Skin Texture: No Abnormalities Noted N/A N/A Periwound Skin Moisture: No Abnormalities Noted N/A N/A Periwound Skin Color: Erythema: Yes N/A N/A Erythema Location: Circumferential N/A N/A Temperature: No Abnormality N/A N/A Tenderness on Palpation: Yes N/A N/A Wound Preparation: Ulcer Cleansing: N/A N/A Rinsed/Irrigated with Saline Topical Anesthetic Applied: Other: lidocaine 4% Procedures Performed: Debridement N/A N/A Treatment Notes Electronic Signature(s) Signed: 11/22/2017 5:01:55 PM By: Baltazar Najjar MD Entered By: Baltazar Najjar on 11/22/2017 14:24:07 [name] ([id number]) -------------------------------------------------------------------------------- Multi-Disciplinary Care Plan Details Patient Name: [name] Date of Service: 11/22/2017 1:30 PM Medical Record Number: [id number] Patient Account Number: [id number] Date of Birth/Sex: 02-26-1930 (83 y.o. Female) Treating RN: Curtis Sites Primary Care Sriya Kroeze: Aram Beecham Other Clinician: Referring Seibert Keeter: Aram Beecham Treating Berklee Battey/Extender: [facility] [place] in Treatment: 1 Active Inactive ` Orientation to the Wound Care Program Nursing Diagnoses: Knowledge deficit related to the wound healing center program Goals: Patient/caregiver will verbalize understanding of the Wound Healing Center Program Date Initiated: 11/14/2017 Target Resolution Date: 11/28/2017 Goal Status: Active Interventions: Provide education on orientation to the wound center Notes: ` Wound/Skin Impairment Nursing Diagnoses: Impaired tissue integrity Knowledge deficit related to ulceration/compromised skin integrity Goals: Patient/caregiver will verbalize understanding of skin care regimen Date Initiated: 11/14/2017 Target Resolution  Date: 11/28/2017 Goal Status: Active Ulcer/skin breakdown will have a volume reduction of 30% by week 4 Date Initiated: 11/14/2017 Target Resolution Date: 11/28/2017 Goal Status: Active Interventions: Assess patient/caregiver ability to obtain necessary supplies Assess patient/caregiver ability to perform ulcer/skin care regimen upon admission and as needed Assess ulceration(s) every visit Treatment Activities: Skin care regimen initiated : 11/14/2017 Notes: Electronic Signature(s) Signed: 11/22/2017 5:01:45 PM By: Estrella Myrtle, Lawernce Keas ([id number]) Entered By: Curtis Sites on 11/22/2017 14:11:03 [name] ([id number]) -------------------------------------------------------------------------------- Pain Assessment Details Patient Name: [name] Date of Service: 11/22/2017 1:30 PM Medical Record Number: [id number] Patient Account Number: [id number] Date of Birth/Sex: 08-19-1930 (83 y.o. Female) Treating RN: Curtis Sites Primary Care Claudie Rathbone: Aram Beecham Other Clinician: Referring Tine Mabee: Aram Beecham Treating Vishnu Moeller/Extender: [facility]e [place] in Treatment: 1 Active Problems Location of Pain Severity and Description of Pain Patient Has Paino Yes Site Locations Pain Location: Pain in Ulcers With Dressing Change: Yes Duration of the Pain. Constant / Intermittento Constant Pain Management and Medication Current Pain Management: Notes Topical or injectable lidocaine is offered to patient for acute pain when surgical debridement is performed. If needed, Patient is instructed to use over the counter pain medication for the following 24-48 hours after debridement. Wound care MDs do not prescribed pain medications. Patient has chronic pain or uncontrolled pain. Patient has been instructed to make an  appointment with their Primary Care Physician for pain management. Electronic Signature(s) Signed: 11/22/2017 5:01:45 PM By: Curtis Sites Entered By: Curtis Sites on 11/22/2017 13:57:36 [name] ([id number]) -------------------------------------------------------------------------------- Patient/Caregiver Education Details Patient Name: [name] Date of Service: 11/22/2017 1:30 PM Medical Record Number: [id number] Patient Account Number: [id number] Date of Birth/Gender: Jun 21, 1930 (83 y.o. Female) Treating RN: Curtis Sites Primary Care Physician: Aram Beecham Other Clinician: Referring Physician: Aram Beecham Treating Physician/Extender: [facility] [place] in Treatment: 1 Education Assessment Education Provided To: Patient Education Topics Provided Wound/Skin Impairment: Handouts: Other: wound care as ordered Methods: Demonstration, Explain/Verbal Responses: State content correctly Electronic Signature(s) Signed: 11/22/2017 5:01:45 PM By: Curtis Sites Entered By: Curtis Sites on 11/22/2017 14:38:29 [name] ([id number]) -------------------------------------------------------------------------------- Wound Assessment Details Patient Name: [name] Date of Service: 11/22/2017 1:30 PM Medical Record Number: [id number] Patient Account Number: [id number] Date of Birth/Sex: July 23, 1930 (83 y.o. Female) Treating RN: Curtis Sites Primary Care Chancey Cullinane: Aram Beecham Other Clinician: Referring Aeon Kessner: Aram Beecham Treating Lateef Juncaj/Extender: [facility]e [place] in Treatment: 1 Wound Status Wound Number: 5 Primary Etiology: Trauma, Other Wound Location: Right Lower Leg - Anterior Wound Status: Open Wounding Event: Trauma Comorbid Hypertension, Gout, Osteoarthritis, History: Neuropathy Date Acquired: 09/05/2017 Weeks Of Treatment: 1 Clustered Wound: No Wound Measurements Length: (cm) 1.1 Width: (cm) 1.2 Depth: (cm) 0.1 Area: (cm) 1.037 Volume: (cm) 0.104 % Reduction in Area: 21.4% % Reduction in Volume: 21.2% Epithelialization:  None Tunneling: No Undermining: No Wound Description Full Thickness With Exposed Support Foul Classification: Structures Sloug Wound Margin: Distinct, outline attached Exudate Large Amount: Exudate Type: Serosanguineous Exudate Color: red, brown Odor After Cleansing: No h/Fibrino Yes Wound Bed Granulation Amount: Medium (34-66%) Exposed Structure Granulation Quality: Red Fascia Exposed: No Necrotic Amount: Medium (34-66%) Fat Layer (Subcutaneous Tissue) Exposed: Yes Necrotic Quality: Adherent Slough Tendon Exposed: No Muscle Exposed: No Joint Exposed: No Bone Exposed: No Periwound Skin Texture Texture Color No Abnormalities Noted: No No Abnormalities Noted: No Erythema: Yes Moisture Erythema Location: Circumferential No Abnormalities Noted: No Temperature / Pain Temperature: No Abnormality Tenderness on Palpation: Yes Wound Preparation Ulcer Cleansing: Rinsed/Irrigated with Saline Topical Anesthetic Applied: Other: lidocaine 4%, [place], CULL ([id number]) Treatment Notes Wound #5 (Right, Anterior Lower Leg) 1. Cleansed with: Clean wound with Normal Saline 2. Anesthetic Topical Lidocaine 4% cream to wound bed prior to debridement 4. Dressing Applied: Santyl Ointment 5. Secondary Dressing Applied Bordered Foam Dressing Dry Gauze Electronic Signature(s) Signed: 11/22/2017 5:01:45 PM By: Curtis Sites Entered By: Curtis Sites on 11/22/2017 14:03:36 [name] ([id number]) -------------------------------------------------------------------------------- Vitals Details Patient Name: [name] Date of Service: 11/22/2017 1:30 PM Medical Record Number: [id number] Patient Account Number: [id number] Date of Birth/Sex: Dec 29, 1929 (83 y.o. Female) Treating RN: Curtis Sites Primary Care Malini Flemings: Aram Beecham Other Clinician: Referring Monasia Lair: Aram Beecham Treating Tyrez Berrios/Extender: [facility]e [place] in Treatment: 1 Vital  Signs Time Taken: 13:57 Temperature (F): 98.3 Height (in): 60 Pulse (bpm): 84 Weight (lbs): 123 Respiratory Rate (breaths/min): 18 Body Mass Index (BMI): 24 Blood Pressure (mmHg): 114/63 Reference Range: 80 - 120 mg / dl Electronic Signature(s) Signed: 11/22/2017 5:01:45 PM By: Curtis Sites Entered By: Curtis Sites on 11/22/2017 13:58:52
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
## 2017-11-28 ENCOUNTER — Encounter: Payer: Self-pay | Admitting: Physical Therapy

## 2017-11-28 ENCOUNTER — Ambulatory Visit: Payer: Medicare Other | Admitting: Physical Therapy

## 2017-11-28 ENCOUNTER — Other Ambulatory Visit: Payer: Self-pay

## 2017-11-28 DIAGNOSIS — R2681 Unsteadiness on feet: Secondary | ICD-10-CM | POA: Diagnosis present

## 2017-11-28 DIAGNOSIS — M6281 Muscle weakness (generalized): Secondary | ICD-10-CM | POA: Diagnosis present

## 2017-11-28 DIAGNOSIS — G8929 Other chronic pain: Secondary | ICD-10-CM | POA: Diagnosis present

## 2017-11-28 DIAGNOSIS — M25511 Pain in right shoulder: Secondary | ICD-10-CM | POA: Diagnosis present

## 2017-11-28 DIAGNOSIS — R2689 Other abnormalities of gait and mobility: Secondary | ICD-10-CM | POA: Diagnosis present

## 2017-11-28 NOTE — Addendum Note (Signed)
Addended by: Encarnacion Chu D on: 11/28/2017 10:21 PM   Modules accepted: Orders

## 2017-11-28 NOTE — Therapy (Signed)
[place] [facility]INE [address] [place], [address] Phone: [phone]   Fax:  [phone]  Physical Therapy Evaluation  Patient Details  Name: [name] MRN: [id number] Date of Birth: 1930/07/03 Referring Provider: Aram Beecham, MD   Encounter Date: 11/28/2017  PT End of Session - 11/28/17 1504    Visit Number  1    Number of Visits  13    Date for PT Re-Evaluation  01/09/18    Authorization Type  G codes 1/10    PT Start Time  1347    PT Stop Time  1430    PT Time Calculation (min)  43 min    Equipment Utilized During Treatment  Gait belt    Activity Tolerance  Patient tolerated treatment well    Behavior During Therapy  [facility] for tasks assessed/performed       Past Medical History:  Diagnosis Date  . Anemia   . GERD (gastroesophageal reflux disease)   . Hypertension   . Syncope     History reviewed. No pertinent surgical history.  There were no vitals filed for this visit.   Subjective Assessment - 11/28/17 1358    Subjective  Pt presenting for impaired balance    Pertinent History  Pt is well known to this clinic, just recently d/c from this clinic on 12/6 after being seen for R shoulder pain.  Pt presents today reporting that her balance is not good and finds herself unsteady at times.  Pt has had EMGs performed BUEs and BLEs which were both negative.  Pt reports she does have numbness in L foot up to ankle at all times.  Pt reports that her balance is worse when standing still, improves when she is moving. Goals: would like to be able to walk through the grass to check on her garden. Pt with h/o ACDF C4-6 on 04/19/17. Pt has a h/o 3 surgeries in her lumbar spine.     Limitations  Standing;Walking    How long can you sit comfortably?  1 hr    How long can you stand comfortably?  10 minutes    How long can you walk comfortably?  2 or 3 blocks    Diagnostic tests  EMG testing BLEs and BUEs which  were negative    Patient Stated Goals  To improve her balance, to be able to walk her dog    Currently in Pain?  No/denies    Multiple Pain Sites  No         [facility] PT Assessment - 11/28/17 1402      Assessment   Medical Diagnosis  Bilateral leg weakness    Referring Provider  Aram Beecham, MD    Onset Date/Surgical Date  -- progressively over the past few years    Hand Dominance  Right    Prior Therapy  Yes      Precautions   Precautions  Cervical    Precaution Comments  No lifting > 10 pounds      Restrictions   Weight Bearing Restrictions  No      Balance Screen   Has the patient fallen in the past 6 months  No    How many times?  0    Has the patient had a decrease in activity level because of a fear of falling?   No    Is the patient reluctant to leave their home because of a fear of falling?  No      Home Environment   Living Environment  Private residence    Living Arrangements  Alone    Type of Home  House    Home Access  Stairs to enter    Entrance Stairs-Number of Steps  5    Entrance Stairs-Rails  Right    Home Layout  Two level;Able to live on main level with bedroom/bathroom    Home Equipment  Cane - single point      Prior Function   Level of Independence  Requires assistive device for independence    Vocation  Retired      IT consultant   Overall Cognitive Status  Within Functional Limits for tasks assessed      ROM / Strength   AROM / PROM / Strength  Strength      Strength   Overall Strength  Deficits    Strength Assessment Site  Hip;Knee;Ankle    Right/Left Hip  Left;Right    Right Hip Flexion  3/5    Right Hip External Rotation   3/5    Right Hip Internal Rotation  5/5    Right Hip ABduction  4-/5    Right Hip ADduction  5/5    Left Hip Flexion  3/5    Left Hip External Rotation  3/5    Left Hip Internal Rotation  3/5    Left Hip ABduction  4-/5    Left Hip ADduction  5/5    Right/Left Knee  Left;Right    Right Knee Flexion  3/5     Right Knee Extension  3/5    Left Knee Flexion  3/5    Left Knee Extension  3/5    Right/Left Ankle  Right;Left    Right Ankle Dorsiflexion  4/5    Left Ankle Dorsiflexion  3/5      Balance   Balance Assessed  Yes      Standardized Balance Assessment   Standardized Balance Assessment  Berg Balance Test      Berg Balance Test   Sit to Stand  Able to stand  independently using hands    Standing Unsupported  Able to stand safely 2 minutes    Sitting with Back Unsupported but Feet Supported on Floor or Stool  Able to sit safely and securely 2 minutes    Stand to Sit  Controls descent by using hands    Transfers  Able to transfer safely, minor use of hands    Standing Unsupported with Eyes Closed  Able to stand 10 seconds safely    Standing Ubsupported with Feet Together  Able to place feet together independently and stand 1 minute safely    From Standing, Reach Forward with Outstretched Arm  Can reach forward >5 cm safely (2")    From Standing Position, Pick up Object from Floor  Able to pick up shoe, needs supervision    From Standing Position, Turn to Look Behind Over each Shoulder  Turn sideways only but maintains balance    Turn 360 Degrees  Able to turn 360 degrees safely but slowly    Standing Unsupported, Alternately Place Feet on Step/Stool  Needs assistance to keep from falling or unable to try    Standing Unsupported, One Foot in [employer] balance while stepping or standing    Standing on One Leg  Unable to try or needs assist to prevent fall    Total Score  35  EXAMINATION  Outcome measures were completed and results explained to the patient:  5xSTS: 25:30 sec  (without AD): 0.58 m/s  Berg: 35/56   Posture: mild thoracic kyphosis and forward head   Gait Analysis: very guarded posture with decreased arm swing and dec trunk rotation Bil. Deg DF Bil, dec step length Bil, pt looking down with mildly flexed posture.      TREATMENT  Sit<>stand 2x5  without UE support   Ambulating in gym without AD with cues for upright posture and forward gaze as well as slightly increasing step length Bil       Objective measurements completed on examination: See above findings.              PT Education - 11/28/17 1503    Education provided  Yes    Education Details  Role of PT, POC, HEP initiated and handout provided, results of examination    Person(s) Educated  Patient    Methods  Explanation;Demonstration;Verbal cues;Handout    Comprehension  Verbalized understanding;Returned demonstration;Verbal cues required;Need further instruction       PT Short Term Goals - 11/28/17 2111      PT SHORT TERM GOAL #1   Title  Pt will be independent with HEP for carryover between sessions    Time  2    Period  Weeks    Status  New        PT Long Term Goals - 11/28/17 2112      PT LONG TERM GOAL #1   Title  Pt will improve Berg score to at least 45/56 to demonstrate improved balance    Baseline  35/56    Time  5    Period  Weeks    Status  New      PT LONG TERM GOAL #2   Title  Pt will improve 5xSTS time to at least 15 seconds to demonstrate improved BLE strength and balance    Baseline  25:30    Time  4    Period  Weeks    Status  New      PT LONG TERM GOAL #3   Title  Pt will demonstrate BLE strength at least 4/5 throughout for improved functional mobility    Baseline  See Evaluation    Time  6    Period  Weeks    Status  New      PT LONG TERM GOAL #4   Title  Pt will be able to walk through her grass to check on her garden without LOB for improved QOL    Baseline  Unable     Time  4    Period  Weeks    Status  New      PT LONG TERM GOAL #5   Title  Pt will improve to at least 1.0 m/s for pt to transition to community ambulator    Baseline  0.58 m/s    Time  4    Period  Weeks    Status  New             Plan - 11/28/17 1505    Clinical Impression Statement  Pt is a 83 y/o F who presents with  impaired balance.  Her 5xSTS and MMT suggest BLE weakness with LLE more weak than RLE.  Pt scored a 35/56 on the [employer], indicating pt is at an increased risk of falling.  Her demonstrates a decreased gait speed and  pt demonstrates impaired gait mechanics placing her at greater risk of falling.  The pt will benefit from skilled PT interventions for improved balance and BLE strength to decrease risk of falling and improved QOL.     History and Personal Factors relevant to plan of care:  (-) H/o multiple back surgeries as well as ACDF, h/o Bil shoulder pain, multiple comorbidities  (+) pt motivated to to improve her balance    Clinical Presentation  Stable    Clinical Presentation due to:  Pt with no new develpment attributing to balance impairments.  Pt with h/o slowly progressing impaired balance over the past several years.     Clinical Decision Making  Low    Rehab Potential  Good    PT Frequency  2x / week    PT Duration  6 weeks    PT Treatment/Interventions  ADLs/Self Care Home Management;Aquatic Therapy;Biofeedback;Cryotherapy;Electrical Stimulation;Iontophoresis 4mg /ml Dexamethasone;Moist Heat;Ultrasound;Parrafin;Fluidtherapy;Contrast Bath;DME Instruction;Gait training;Stair training;Functional mobility training;Therapeutic activities;Therapeutic exercise;Balance training;Neuromuscular re-education;Patient/family education;Orthotic Fit/Training;Manual techniques;Passive range of motion;Dry needling;Energy conservation;Taping    PT Next Visit Plan  Have pt complete ABC scale, assess HEP and progress strengthening exercises, initiate balance exercises    PT Home Exercise Plan  Sit<>stand    Recommended Other Services  None at this time.     Consulted and Agree with Plan of Care  Patient       Patient will benefit from skilled therapeutic intervention in order to improve the following deficits and impairments:  Abnormal gait, Decreased activity tolerance, Decreased balance,  Decreased endurance, Decreased knowledge of use of DME, Decreased mobility, Decreased range of motion, Decreased safety awareness, Decreased strength, Difficulty walking, Impaired perceived functional ability, Impaired flexibility, Impaired sensation, Impaired UE functional use, Improper body mechanics, Postural dysfunction  Visit Diagnosis: Unsteadiness on feet  Muscle weakness (generalized)  Other abnormalities of gait and mobility  G-Codes - 2017-12-03 04/01/20    Functional Assessment Tool Used (Outpatient Only)  Berg Balance Test, 5xSTS, 2122, clinical judgement, MMT, gait assessment    Functional Limitation  Mobility: Walking and moving around    Mobility: Walking and Moving Around Current Status [phone])  At least 20 percent but less than 40 percent impaired, limited or restricted    Mobility: Walking and Moving Around Goal Status [phone])  At least 1 percent but less than 20 percent impaired, limited or restricted        Problem List Patient Active Problem List   Diagnosis Date Noted  . Lymphedema 10/11/2017  . Chronic venous insufficiency 10/11/2017  . Leg pain 10/11/2017  . Leg swelling 10/11/2017  . COPD (chronic obstructive pulmonary disease) (HCC) 10/11/2017  . Essential hypertension 10/11/2017    10/13/2017 PT, DPT December 03, 2017, 9:23 PM  [place] [facility] PH[facility] SPORTS MEDICINE [address], [address], [place] Phone: [phone]   Fax:  [phone]  Name: [name] MRN: [name] Date of Birth: Oct 20, 1930

## 2017-11-29 ENCOUNTER — Encounter: Payer: Medicare Other | Admitting: Internal Medicine

## 2017-11-29 ENCOUNTER — Encounter: Payer: Medicare Other | Admitting: Physical Therapy

## 2017-11-29 ENCOUNTER — Ambulatory Visit
Admission: RE | Admit: 2017-11-29 | Discharge: 2017-11-29 | Disposition: A | Discharge status: Home / Self Care | Payer: Medicare Other | Source: Ambulatory Visit | Attending: Internal Medicine | Admitting: Internal Medicine

## 2017-11-29 DIAGNOSIS — Z1231 Encounter for screening mammogram for malignant neoplasm of breast: Secondary | ICD-10-CM | POA: Insufficient documentation

## 2017-11-29 DIAGNOSIS — S81811A Laceration without foreign body, right lower leg, initial encounter: Secondary | ICD-10-CM | POA: Diagnosis not present

## 2017-11-30 ENCOUNTER — Encounter: Payer: Self-pay | Admitting: Physical Therapy

## 2017-11-30 ENCOUNTER — Other Ambulatory Visit: Payer: Self-pay

## 2017-11-30 ENCOUNTER — Ambulatory Visit: Payer: Medicare Other | Admitting: Physical Therapy

## 2017-11-30 DIAGNOSIS — R2681 Unsteadiness on feet: Secondary | ICD-10-CM

## 2017-11-30 DIAGNOSIS — M6281 Muscle weakness (generalized): Secondary | ICD-10-CM

## 2017-11-30 DIAGNOSIS — R2689 Other abnormalities of gait and mobility: Secondary | ICD-10-CM

## 2017-11-30 DIAGNOSIS — M25511 Pain in right shoulder: Secondary | ICD-10-CM | POA: Diagnosis not present

## 2017-11-30 NOTE — Therapy (Signed)
[place] [name] [address] [place], [address] Phone: [phone]   Fax:  [phone]  Physical Therapy Treatment  Patient Details  Name: [name] MRN: [id number] Date of Birth: 11/21/1930 Referring Provider: Aram Beecham, MD   Encounter Date: 11/30/2017  PT End of Session - 11/30/17 1448    Visit Number  2    Number of Visits  13    Date for PT Re-Evaluation  01/09/18    Authorization Type  G codes 2023-02-13    PT Start Time  1447    PT Stop Time  1532    PT Time Calculation (min)  45 min    Equipment Utilized During Treatment  Gait belt    Activity Tolerance  Patient tolerated treatment well    Behavior During Therapy  [facility] for tasks assessed/performed       Past Medical History:  Diagnosis Date  . Anemia   . GERD (gastroesophageal reflux disease)   . Hypertension   . Syncope     History reviewed. No pertinent surgical history.  There were no vitals filed for this visit.  Subjective Assessment - 11/30/17 1449    Subjective  Pt has not new complaints or concerns.  Pt has not been practicing her walking with bigger steps.  She has been doing her sit<>stand as her HEP without questions or concerns.     Pertinent History  Pt is well known to this clinic, just recently d/c from this clinic on 12/6 after being seen for R shoulder pain.  Pt presents today reporting that her balance is not good and finds herself unsteady at times.  Pt has had EMGs performed BUEs and BLEs which were both negative.  Pt reports she does have numbness in L foot up to ankle at all times.  Pt reports that her balance is worse when standing still, improves when she is moving. Goals: would like to be able to walk through the grass to check on her garden. Pt with h/o ACDF C4-6 on 04/19/17. Pt has a h/o 3 surgeries in her lumbar spine.     Limitations  Standing;Walking    How long can you sit comfortably?  1 hr    How long can you  stand comfortably?  10 minutes    How long can you walk comfortably?  2 or 3 blocks    Diagnostic tests  EMG testing BLEs and BUEs which were negative    Patient Stated Goals  To improve her balance, to be able to walk her dog    Currently in Pain?  No/denies       TREATMENT  ABC scale: 51%  Sit<>stand 2x5 without UE support with occasional posterior LOB. Cues to be sure her weight is shifting anteriorly enough and to avoid all weight through heels.  Ambulating in gym without AD with cues for upright posture and forward gaze as well as slightly increasing step length Bil  Mini squats without UE support with cues to avoid knee valgus. 2x10  BLE leg press with 45# 3x10 with ball between knees to prevent knee valgus  Tandem stance on level ground 2x30 second each LE (added to HEP)  SLS on level ground 2x30 seconds each LE with intermittent UE support (added to HEP)  Lateral walking with slight knee bend for glute activation 8x10 ft  PT Education - 11/30/17 1448    Education provided  Yes    Education Details  Exercise technique    Person(s) Educated  Patient    Methods  Explanation;Demonstration;Verbal cues    Comprehension  Verbalized understanding;Returned demonstration;Verbal cues required;Need further instruction       PT Short Term Goals - 11/28/17 2111      PT SHORT TERM GOAL #1   Title  Pt will be independent with HEP for carryover between sessions    Time  2    Period  Weeks    Status  New        PT Long Term Goals - 11/30/17 1528      PT LONG TERM GOAL #1   Title  Pt will improve Berg score to at least 45/56 to demonstrate improved balance    Baseline  35/56    Time  5    Period  Weeks    Status  New      PT LONG TERM GOAL #2   Title  Pt will improve 5xSTS time to at least 15 seconds to demonstrate improved BLE strength and balance    Baseline  25:30    Time  4    Period  Weeks    Status  New      PT LONG TERM  GOAL #3   Title  Pt will demonstrate BLE strength at least 4/5 throughout for improved functional mobility    Baseline  See Evaluation    Time  6    Period  Weeks    Status  New      PT LONG TERM GOAL #4   Title  Pt will be able to walk through her grass to check on her garden without LOB for improved QOL    Baseline  Unable     Time  4    Period  Weeks    Status  New      PT LONG TERM GOAL #5   Title  Pt will improve to at least 1.0 m/s for pt to transition to community ambulator    Baseline  0.58 m/s    Time  4    Period  Weeks    Status  New      Additional Long Term Goals   Additional Long Term Goals  Yes      PT LONG TERM GOAL #6   Title  Pt will improve ABC scale to at least 30% to demonstrate decreased effect of impaired balance on daily activities    Baseline  51%    Time  4    Period  Weeks    Status  New            Plan - 11/30/17 1456    Clinical Impression Statement  Emphasized importance of completing HEP as prescribed.  Pt instructed in strengthening and balance exercises this session.  Pt demonstrates fatigue with strengthening exercises and requires rest breaks between exercises.  Pt demonstrates instability with tandem stance and SLS activities. She will benefit from continued skilled PT interventions for improved balance and strengthening for decreased risk of falling.     Rehab Potential  Good    PT Frequency  2x / week    PT Duration  6 weeks    PT Treatment/Interventions  ADLs/Self Care Home Management;Aquatic Therapy;Biofeedback;Cryotherapy;Electrical Stimulation;Iontophoresis 4mg /ml Dexamethasone;Moist Heat;Ultrasound;Parrafin;Fluidtherapy;Contrast Bath;DME Instruction;Gait training;Stair training;Functional mobility training;Therapeutic activities;Therapeutic exercise;Balance training;Neuromuscular re-education;Patient/family education;Orthotic Fit/Training;Manual techniques;Passive range of motion;Dry needling;Energy conservation;Taping  PT  Next Visit Plan  Have pt complete ABC scale, assess HEP and progress strengthening exercises, initiate balance exercises    PT Home Exercise Plan  Sit<>stand    Consulted and Agree with Plan of Care  Patient       Patient will benefit from skilled therapeutic intervention in order to improve the following deficits and impairments:  Abnormal gait, Decreased activity tolerance, Decreased balance, Decreased endurance, Decreased knowledge of use of DME, Decreased mobility, Decreased range of motion, Decreased safety awareness, Decreased strength, Difficulty walking, Impaired perceived functional ability, Impaired flexibility, Impaired sensation, Impaired UE functional use, Improper body mechanics, Postural dysfunction  Visit Diagnosis: Unsteadiness on feet  Muscle weakness (generalized)  Other abnormalities of gait and mobility     Problem List Patient Active Problem List   Diagnosis Date Noted  . Lymphedema 10/11/2017  . Chronic venous insufficiency 10/11/2017  . Leg pain 10/11/2017  . Leg swelling 10/11/2017  . COPD (chronic obstructive pulmonary disease) (HCC) 10/11/2017  . Essential hypertension 10/11/2017    Encarnacion Chu PT, DPT 11/30/2017, 3:33 PM  [place] [facility] PHYSICAL AND SPORTS MEDICINE [address] [place], [address] Phone: [phone]   Fax:  [phone]  Name: [name] MRN: [id number] Date of Birth: [birth year]

## 2017-12-03 NOTE — Progress Notes (Signed)
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 Negative for: Anorexia/bulimia; Confinement Anxiety Immunizations Pneumococcal Vaccine: Received Pneumococcal Vaccination: Yes Implantable Devices Family and Social History Cancer: Yes - Mother,Siblings; Diabetes: Yes - Father; Heart Disease: Yes - Father,Siblings; Hereditary Spherocytosis: No; Hypertension: Yes - Father,Siblings; Kidney Disease: No; Lung Disease: Yes - Siblings; Seizures: No; Stroke: No; Thyroid [name], [name] ([id number]) Problems: No; Tuberculosis: No; Never smoker; Marital Status - Married; Alcohol Use: Never; Drug Use: No History; Caffeine Use: Never; Financial Concerns: No; Food, Clothing or Shelter Needs: No; Support System Lacking: No; Transportation Concerns: No; Advanced Directives: No; Patient does not want information on Advanced Directives; Medical Power of Attorney: Yes - Dyann Goodspeed (Copy provided) Electronic Signature(s) Signed: 11/14/2017 1:56:40 PM By: Renne Crigler Signed: 11/14/2017 5:06:08 PM By: Lenda Kelp PA-C Entered By: Renne Crigler on 11/14/2017 12:52:19 [name] ([id number]) -------------------------------------------------------------------------------- SuperBill Details Patient Name: [name] Date of Service: 11/14/2017 Medical Record Number: [id number] Patient Account Number: [id number] Date of Birth/Sex: 1930-08-28 (83 y.o. Female)  Treating RN: Renne Crigler Primary Care Provider: Aram Beecham Other Clinician: Referring Provider: Aram Beecham Treating Provider/Extender: Linwood Dibbles, Yina Riviere Weeks in Treatment: 0 Diagnosis Coding ICD-10 Codes Code Description [phone] Laceration without foreign body, right lower leg, initial encounter L97.812 Non-pressure chronic ulcer of other part of right lower leg with fat layer exposed Facility Procedures CPT4 Code Description: [id number] 11042 - DEB SUBQ TISSUE 20 SQ CM/< ICD-10 Diagnosis Description L97.812 Non-pressure chronic ulcer of other part of right lower leg wit Modifier: h fat layer expo Quantity: 1 sed Physician Procedures CPT4 Code Description: [id number] 99213 - WC PHYS LEVEL 3 - EST PT ICD-10 Diagnosis Description S81.811A Laceration without foreign body, right lower leg, initial encou L97.812 Non-pressure chronic ulcer of other part of right lower leg wit Modifier: 25 nter h fat layer expo Quantity: 1 sed CPT4 Code Description: [id number] 11042 - WC PHYS SUBQ TISS 20 SQ CM ICD-10 Diagnosis Description L97.812 Non-pressure chronic ulcer of other part of right lower leg wit Modifier: h fat layer expo Quantity: 1 sed Electronic Signature(s) Signed: 11/14/2017 5:06:08 PM By: Lenda Kelp PA-C Entered By: Lenda Kelp on 11/14/2017 13:41:55

## 2017-12-03 NOTE — Progress Notes (Signed)
[name], Laconda V. ([id number]) Visit Report for 11/29/2017 Arrival Information Details Patient Name: [name], Ariam V. Date of Service: 11/29/2017 11:00 AM Medical Record Number: [id number] Patient Account Number: [id number] Date of Birth/Sex: 12/01/1930 (83 y.o. Female) Treating RN: Curtis Sitesorthy, Joanna Primary Care Sweta Halseth: Aram BeechamSparks, Jeffrey Other Clinician: Referring Hezekiah Veltre: Aram BeechamSparks, Jeffrey Treating Omid Deardorff/Extender: Altamese CarolinaOBSON, [name] Weeks in Treatment: 2 Visit Information History Since Last Visit Added or deleted any medications: No Patient Arrived: Cane Any new allergies or adverse reactions: No Arrival Time: 11:02 Had a fall or experienced change in No Accompanied By: self activities of daily living that may affect Transfer Assistance: None risk of falls: Patient Identification Verified: Yes Signs or symptoms of abuse/neglect since last visito No Secondary Verification Process Completed: Yes Hospitalized since last visit: No Patient Requires Transmission-Based Precautions: No Has Dressing in Place as Prescribed: Yes Patient Has Alerts: No Pain Present Now: No Electronic Signature(s) Signed: 11/29/2017 4:36:31 PM By: Curtis Sitesorthy, Joanna Entered By: Curtis Sitesorthy, Joanna on 11/29/2017 11:05:32 [name], Anallely V. ([id number]) -------------------------------------------------------------------------------- Encounter Discharge Information Details Patient Name: [name], Aylene V. Date of Service: 11/29/2017 11:00 AM Medical Record Number: [id number] Patient Account Number: [id number] Date of Birth/Sex: 04/13/1930 (83 y.o. Female) Treating RN: Curtis Sitesorthy, Joanna Primary Care Jaxon Flatt: Aram BeechamSparks, Jeffrey Other Clinician: Referring Ladarrian Asencio: Aram BeechamSparks, Jeffrey Treating Vester Titsworth/Extender: Altamese CarolinaOBSON, [name] Weeks in Treatment: 2 Encounter Discharge Information Items Discharge Pain Level: 0 Discharge Condition: Stable Ambulatory Status: Cane Discharge Destination: Home Transportation: Private  Auto Accompanied By: self Schedule Follow-up Appointment: Yes Medication Reconciliation completed and No provided to Patient/Care Oluwaferanmi Wain: Provided on Clinical Summary of Care: 11/29/2017 Form Type [facility]ipient Paper [facility] Electronic Signature(s) Signed: 11/29/2017 11:48:20 AM By: Curtis Sitesorthy, Joanna Entered By: Curtis Sitesorthy, Joanna on 11/29/2017 11:48:20 [name], Abbrielle V. ([id number]) -------------------------------------------------------------------------------- Lower Extremity Assessment Details Patient Name: [name], Wyoma V. Date of Service: 11/29/2017 11:00 AM Medical Record Number: [id number] Patient Account Number: [id number] Date of Birth/Sex: 06/13/1930 (83 y.o. Female) Treating RN: Curtis Sitesorthy, Joanna Primary Care Sherri Levenhagen: Aram BeechamSparks, Jeffrey Other Clinician: Referring Corinda Ammon: Aram BeechamSparks, Jeffrey Treating Montrail Mehrer/Extender: Altamese CarolinaOBSON, [name] Weeks in Treatment: 2 Edema Assessment Assessed: [Left: No] [Right: No] [Left: Edema] [Right: :] Calf Left: Right: Point of Measurement: 30 cm From Medial Instep cm 34.7 cm Ankle Left: Right: Point of Measurement: 10 cm From Medial Instep cm 21.3 cm Vascular Assessment Pulses: Dorsalis Pedis Palpable: [Right:Yes] Posterior Tibial Palpable: [Right:Yes] Extremity colors, hair growth, and conditions: Extremity Color: [Right:Hyperpigmented] Hair Growth on Extremity: [Right:No] Temperature of Extremity: [Right:Warm] Capillary Refill: [Right:< 3 seconds] Electronic Signature(s) Signed: 11/29/2017 4:36:31 PM By: Curtis Sitesorthy, Joanna Entered By: Curtis Sitesorthy, Joanna on 11/29/2017 11:15:16 [name], Gwyndolyn V. ([id number]) -------------------------------------------------------------------------------- Multi Wound Chart Details Patient Name: [name], Maylani V. Date of Service: 11/29/2017 11:00 AM Medical Record Number: [id number] Patient Account Number: [id number] Date of Birth/Sex: 08/23/1930 (83 y.o. Female) Treating RN: Curtis Sitesorthy, Joanna Primary Care Bralon Antkowiak:  Aram BeechamSparks, Jeffrey Other Clinician: Referring Lamonda Noxon: Aram BeechamSparks, Jeffrey Treating Brandyn Lowrey/Extender: Altamese CarolinaOBSON, [name] Weeks in Treatment: 2 Vital Signs Height(in): 60 Pulse(bpm): 67 Weight(lbs): 123 Blood Pressure(mmHg): 127/56 Body Mass Index(BMI): 24 Temperature(F): 98.0 Respiratory Rate 18 (breaths/min): Photos: [5:No Photos] [N/A:N/A] Wound Location: [5:Right Lower Leg - Anterior] [N/A:N/A] Wounding Event: [5:Trauma] [N/A:N/A] Primary Etiology: [5:Trauma, Other] [N/A:N/A] Comorbid History: [5:Hypertension, Gout, Osteoarthritis, Neuropathy] [N/A:N/A] Date Acquired: [5:09/05/2017] [N/A:N/A] Weeks of Treatment: [5:2] [N/A:N/A] Wound Status: [5:Open] [N/A:N/A] Measurements L x W x D [5:1.1x1.1x0.1] [N/A:N/A] (cm) Area (cm) : [5:0.95] [N/A:N/A] Volume (cm) : [5:0.095] [N/A:N/A] % Reduction in Area: [5:28.00%] [N/A:N/A] % Reduction in Volume: [5:28.00%] [N/A:N/A] Classification: [5:Full Thickness With Exposed  Support Structures] [N/A:N/A] Exudate Amount: [5:Large] [N/A:N/A] Exudate Type: [5:Serosanguineous] [N/A:N/A] Exudate Color: [5:red, brown] [N/A:N/A] Wound Margin: [5:Distinct, outline attached] [N/A:N/A] Granulation Amount: [5:Medium (34-66%)] [N/A:N/A] Granulation Quality: [5:Red] [N/A:N/A] Necrotic Amount: [5:Medium (34-66%)] [N/A:N/A] Exposed Structures: [5:Fat Layer (Subcutaneous Tissue) Exposed: Yes Fascia: No Tendon: No Muscle: No Joint: No Bone: No] [N/A:N/A] Epithelialization: [5:None] [N/A:N/A] Debridement: [5:Debridement (11042-11047)] [N/A:N/A] Pre-procedure [5:11:21] [N/A:N/A] Verification/Time Out Taken: Pain Control: [5:Lidocaine 4% Topical Solution] [N/A:N/A] Tissue Debrided: [5:Fibrin/Slough, Subcutaneous] [N/A:N/A] Level: Skin/Subcutaneous Tissue N/A N/A Debridement Area (sq cm): 1.21 N/A N/A Instrument: Curette N/A N/A Bleeding: Minimum N/A N/A Hemostasis Achieved: Pressure N/A N/A Procedural Pain: 0 N/A N/A Post Procedural Pain: 0 N/A  N/A Debridement Treatment Procedure was tolerated well N/A N/A Response: Post Debridement 1.1x1.1x0.2 N/A N/A Measurements L x W x D (cm) Post Debridement Volume: 0.19 N/A N/A (cm) Periwound Skin Texture: No Abnormalities Noted N/A N/A Periwound Skin Moisture: No Abnormalities Noted N/A N/A Periwound Skin Color: Erythema: Yes N/A N/A Erythema Location: Circumferential N/A N/A Temperature: No Abnormality N/A N/A Tenderness on Palpation: Yes N/A N/A Wound Preparation: Ulcer Cleansing: N/A N/A Rinsed/Irrigated with Saline Topical Anesthetic Applied: Other: lidocaine 4% Procedures Performed: Debridement N/A N/A Treatment Notes Wound #5 (Right, Anterior Lower Leg) 1. Cleansed with: Clean wound with Normal Saline 2. Anesthetic Topical Lidocaine 4% cream to wound bed prior to debridement 4. Dressing Applied: Santyl Ointment 5. Secondary Dressing Applied Bordered Foam Dressing Dry Gauze Electronic Signature(s) Signed: 12/03/2017 7:28:22 AM By: Baltazar Najjar MD Entered By: Baltazar Najjar on 11/29/2017 12:46:00 Natasha Basset ([id number]) -------------------------------------------------------------------------------- Multi-Disciplinary Care Plan Details Patient Name: Natasha Basset Date of Service: 11/29/2017 11:00 AM Medical Record Number: [id number] Patient Account Number: [id number] Date of Birth/Sex: [birth year] (83 y.o. Female) Treating RN: Curtis Sites Primary Care Yarelis Ambrosino: Aram Beecham Other Clinician: Referring Taiz Bickle: Aram Beecham Treating Marquinn Meschke/Extender: Altamese [place] in Treatment: 2 Active Inactive ` Orientation to the Wound Care Program Nursing Diagnoses: Knowledge deficit related to the wound healing center program Goals: Patient/caregiver will verbalize understanding of the Wound Healing Center Program Date Initiated: 11/14/2017 Target Resolution Date: 11/28/2017 Goal Status: Active Interventions: Provide education on  orientation to the wound center Notes: ` Wound/Skin Impairment Nursing Diagnoses: Impaired tissue integrity Knowledge deficit related to ulceration/compromised skin integrity Goals: Patient/caregiver will verbalize understanding of skin care regimen Date Initiated: 11/14/2017 Target Resolution Date: 11/28/2017 Goal Status: Active Ulcer/skin breakdown will have a volume reduction of 30% by week 4 Date Initiated: 11/14/2017 Target Resolution Date: 11/28/2017 Goal Status: Active Interventions: Assess patient/caregiver ability to obtain necessary supplies Assess patient/caregiver ability to perform ulcer/skin care regimen upon admission and as needed Assess ulceration(s) every visit Treatment Activities: Skin care regimen initiated : 11/14/2017 Notes: Electronic Signature(s) Signed: 11/29/2017 4:36:31 PM By: Estrella Myrtle, Lawernce Keas ([id number]) Entered By: Curtis Sites on 11/29/2017 11:15:25 Natasha Basset ([id number]) -------------------------------------------------------------------------------- Pain Assessment Details Patient Name: Natasha Basset Date of Service: 11/29/2017 11:00 AM Medical Record Number: [id number] Patient Account Number: [id number] Date of Birth/Sex: 1930-05-03 (83 y.o. Female) Treating RN: Curtis Sites Primary Care Jatavia Keltner: Aram Beecham Other Clinician: Referring Karma Ansley: Aram Beecham Treating Cliffie Gingras/Extender: Altamese [place] in Treatment: 2 Active Problems Location of Pain Severity and Description of Pain Patient Has Paino Yes Site Locations Pain Location: Pain in Ulcers With Dressing Change: Yes Duration of the Pain. Constant / Intermittento Intermittent Pain Management and Medication Current Pain Management: Notes Topical or injectable lidocaine is offered to patient for acute pain when surgical debridement is performed. If  needed, Patient is instructed to use over the counter pain medication for the following 24-48  hours after debridement. Wound care MDs do not prescribed pain medications. Patient has chronic pain or uncontrolled pain. Patient has been instructed to make an appointment with their Primary Care Physician for pain management. Electronic Signature(s) Signed: 11/29/2017 4:36:31 PM By: Curtis Sites Entered By: Curtis Sites on 11/29/2017 11:05:45 Natasha Basset ([id number]) -------------------------------------------------------------------------------- Patient/Caregiver Education Details Patient Name: Natasha Basset Date of Service: 11/29/2017 11:00 AM Medical Record Number: [id number] Patient Account Number: [id number] Date of Birth/Gender: 1930-03-26 (83 y.o. Female) Treating RN: Curtis Sites Primary Care Physician: Aram Beecham Other Clinician: Referring Physician: Aram Beecham Treating Physician/Extender: Altamese [place] in Treatment: 2 Education Assessment Education Provided To: Patient Education Topics Provided Wound/Skin Impairment: Handouts: Other: wound care as ordered Methods: Demonstration, Explain/Verbal Responses: State content correctly Electronic Signature(s) Signed: 11/29/2017 4:36:31 PM By: Curtis Sites Entered By: Curtis Sites on 11/29/2017 11:49:54 Natasha Basset ([id number]) -------------------------------------------------------------------------------- Wound Assessment Details Patient Name: Natasha Basset Date of Service: 11/29/2017 11:00 AM Medical Record Number: [id number] Patient Account Number: [id number] Date of Birth/Sex: Oct 31, 1930 (83 y.o. Female) Treating RN: Curtis Sites Primary Care Eldene Plocher: Aram Beecham Other Clinician: Referring Chasady Longwell: Aram Beecham Treating Baljit Liebert/Extender: Altamese [place] in Treatment: 2 Wound Status Wound Number: 5 Primary Etiology: Trauma, Other Wound Location: Right Lower Leg - Anterior Wound Status: Open Wounding Event: Trauma Comorbid Hypertension, Gout,  Osteoarthritis, History: Neuropathy Date Acquired: 09/05/2017 Weeks Of Treatment: 2 Clustered Wound: No Photos Photo Uploaded By: Curtis Sites on 11/29/2017 16:24:33 Wound Measurements Length: (cm) 1.1 Width: (cm) 1.1 Depth: (cm) 0.1 Area: (cm) 0.95 Volume: (cm) 0.095 % Reduction in Area: 28% % Reduction in Volume: 28% Epithelialization: None Tunneling: No Undermining: No Wound Description Full Thickness With Exposed Support Foul O Classification: Structures Slough Wound Margin: Distinct, outline attached Exudate Large Amount: Exudate Type: Serosanguineous Exudate Color: red, brown dor After Cleansing: No /Fibrino Yes Wound Bed Granulation Amount: Medium (34-66%) Exposed Structure Granulation Quality: Red Fascia Exposed: No Necrotic Amount: Medium (34-66%) Fat Layer (Subcutaneous Tissue) Exposed: Yes Necrotic Quality: Adherent Slough Tendon Exposed: No Muscle Exposed: No Joint Exposed: No Bone Exposed: No TAIA, BRAMLETT ([id number]) Periwound Skin Texture Texture Color No Abnormalities Noted: No No Abnormalities Noted: No Erythema: Yes Moisture Erythema Location: Circumferential No Abnormalities Noted: No Temperature / Pain Temperature: No Abnormality Tenderness on Palpation: Yes Wound Preparation Ulcer Cleansing: Rinsed/Irrigated with Saline Topical Anesthetic Applied: Other: lidocaine 4%, Treatment Notes Wound #5 (Right, Anterior Lower Leg) 1. Cleansed with: Clean wound with Normal Saline 2. Anesthetic Topical Lidocaine 4% cream to wound bed prior to debridement 4. Dressing Applied: Santyl Ointment 5. Secondary Dressing Applied Bordered Foam Dressing Dry Gauze Electronic Signature(s) Signed: 11/29/2017 4:36:31 PM By: Curtis Sites Entered By: Curtis Sites on 11/29/2017 11:14:23 Natasha Basset ([id number]) -------------------------------------------------------------------------------- Vitals Details Patient Name: Natasha Basset Date of Service: 11/29/2017 11:00 AM Medical Record Number: [id number] Patient Account Number: [id number] Date of Birth/Sex: 03-17-1930 (83 y.o. Female) Treating RN: Curtis Sites Primary Care Naomy Esham: Aram Beecham Other Clinician: Referring Niana Martorana: Aram Beecham Treating Jossalyn Forgione/Extender: Altamese [place] in Treatment: 2 Vital Signs Time Taken: 11:06 Temperature (F): 98.0 Height (in): 60 Pulse (bpm): 67 Weight (lbs): 123 Respiratory Rate (breaths/min): 18 Body Mass Index (BMI): 24 Blood Pressure (mmHg): 127/56 Reference Range: 80 - 120 mg / dl Electronic Signature(s) Signed: 11/29/2017 4:36:31 PM By: Curtis Sites Entered By: Curtis Sites on 11/29/2017 11:11:12
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
## 2017-12-06 ENCOUNTER — Encounter: Payer: Medicare Other | Admitting: Internal Medicine

## 2017-12-06 ENCOUNTER — Ambulatory Visit: Payer: Medicare Other | Admitting: Physical Therapy

## 2017-12-06 DIAGNOSIS — R2681 Unsteadiness on feet: Secondary | ICD-10-CM

## 2017-12-06 DIAGNOSIS — R2689 Other abnormalities of gait and mobility: Secondary | ICD-10-CM

## 2017-12-06 DIAGNOSIS — M6281 Muscle weakness (generalized): Secondary | ICD-10-CM

## 2017-12-06 DIAGNOSIS — M25511 Pain in right shoulder: Secondary | ICD-10-CM | POA: Diagnosis not present

## 2017-12-06 DIAGNOSIS — S81811A Laceration without foreign body, right lower leg, initial encounter: Secondary | ICD-10-CM | POA: Diagnosis not present

## 2017-12-06 NOTE — Therapy (Signed)
[place] [facility] PH[facility] AND [facility] MEDICINE [address] [place], [address] Phone: [phone]   Fax:  [phone]  Physical Therapy Treatment  Patient Details  Name: JONEE FRANKE MRN: [id number] Date of Birth: [birth year] Referring Provider: Aram Beecham, MD   Encounter Date: 12/06/2017  PT End of Session - 12/06/17 1529    Visit Number  3    Number of Visits  13    Date for PT Re-Evaluation  01/09/18    Authorization Type  G codes 03/17/23    PT Start Time  1438    PT Stop Time  1517    PT Time Calculation (min)  39 min    Equipment Utilized During Treatment  Gait belt    Activity Tolerance  Patient tolerated treatment well    Behavior During Therapy  [facility] for tasks assessed/performed       Past Medical History:  Diagnosis Date  . Anemia   . GERD (gastroesophageal reflux disease)   . Hypertension   . Syncope     No past surgical history on file.  There were no vitals filed for this visit.  Subjective Assessment - 12/06/17 1501    Subjective  Patient reports her legs and knees were pretty sore after her last appointment. Reports she has a shot in her R shoulder scheduled for this Friday.     Pertinent History  Pt is well known to this clinic, just recently d/c from this clinic on 12/6 after being seen for R shoulder pain.  Pt presents today reporting that her balance is not good and finds herself unsteady at times.  Pt has had EMGs performed BUEs and BLEs which were both negative.  Pt reports she does have numbness in L foot up to ankle at all times.  Pt reports that her balance is worse when standing still, improves when she is moving. Goals: would like to be able to walk through the grass to check on her garden. Pt with h/o ACDF C4-6 on 04/19/17. Pt has a h/o 3 surgeries in her lumbar spine.     Limitations  Standing;Walking    How long can you sit comfortably?  1 hr    How long can you stand comfortably?  10 minutes    How long  can you walk comfortably?  2 or 3 blocks    Diagnostic tests  EMG testing BLEs and BUEs which were negative    Patient Stated Goals  To improve her balance, to be able to walk her dog    Currently in Pain?  Other (Comment) Reports she feels a little sick to her stomach this date       Leg Press 35# x 15, 55# x 10 repetitions for 2 sets   Step ups with 2 steps underneath riser x 12, x 10 for 2 sets  with 1 HHA bilaterally   Toe taps on blue foam pad with 1 hand to 2 finger assistance for medial, central, and lateral cones anterior to her COM x 5 bouts through all 3 cones for 3 sets (3 separate tasks including touching all 3 before setting foot down, touch a cone and then back down to pad, then touching cones/pads as fast as possible) performed bilaterally   Side stepping on blue foam pad at treadmill with minimal to no assistance from her UEs with transitioning a ball between hands x 3 bouts of 5 laps on blue foam pad at treadmill (noted to be  substantially fatigued by the end of the session)                         PT Education - 12/06/17 1441    Education provided  Yes    Education Details  Exercise progressions.     Person(s) Educated  Patient    Methods  Explanation;Demonstration;Handout    Comprehension  Verbalized understanding;Returned demonstration       PT Short Term Goals - 11/28/17 2111      PT SHORT TERM GOAL #1   Title  Pt will be independent with HEP for carryover between sessions    Time  2    Period  Weeks    Status  New        PT Long Term Goals - 11/30/17 1528      PT LONG TERM GOAL #1   Title  Pt will improve Berg score to at least 45/56 to demonstrate improved balance    Baseline  35/56    Time  5    Period  Weeks    Status  New      PT LONG TERM GOAL #2   Title  Pt will improve 5xSTS time to at least 15 seconds to demonstrate improved BLE strength and balance    Baseline  25:30    Time  4    Period  Weeks    Status  New       PT LONG TERM GOAL #3   Title  Pt will demonstrate BLE strength at least 4/5 throughout for improved functional mobility    Baseline  See Evaluation    Time  6    Period  Weeks    Status  New      PT LONG TERM GOAL #4   Title  Pt will be able to walk through her grass to check on her garden without LOB for improved QOL    Baseline  Unable     Time  4    Period  Weeks    Status  New      PT LONG TERM GOAL #5   Title  Pt will improve to at least 1.0 m/s for pt to transition to community ambulator    Baseline  0.58 m/s    Time  4    Period  Weeks    Status  New      Additional Long Term Goals   Additional Long Term Goals  Yes      PT LONG TERM GOAL #6   Title  Pt will improve ABC scale to at least 30% to demonstrate decreased effect of impaired balance on daily activities    Baseline  51%    Time  4    Period  Weeks    Status  New            Plan - 12/06/17 1530    Clinical Impression Statement  Patient reports some discomfort in her LEs after last PT session, likely a result of increased resistance and demands on her LEs. Her LEs have lost a substantial amount of strength and power, and in conjunction with dynamic balance deficits appears to play a large role in her instability with gait. She will likely benefit from continued work on dynamic balance on variable surfaces as well as LE strength/power development to allow for postural adjustments in gait.     Clinical Presentation  Stable    Clinical  Decision Making  Moderate    Rehab Potential  Good    PT Frequency  2x / week    PT Duration  6 weeks    PT Treatment/Interventions  ADLs/Self Care Home Management;Aquatic Therapy;Biofeedback;Cryotherapy;Electrical Stimulation;Iontophoresis 4mg /ml Dexamethasone;Moist Heat;Ultrasound;Parrafin;Fluidtherapy;Contrast Bath;DME Instruction;Gait training;Stair training;Functional mobility training;Therapeutic activities;Therapeutic exercise;Balance training;Neuromuscular  re-education;Patient/family education;Orthotic Fit/Training;Manual techniques;Passive range of motion;Dry needling;Energy conservation;Taping    PT Next Visit Plan  Have pt complete ABC scale, assess HEP and progress strengthening exercises, initiate balance exercises    PT Home Exercise Plan  Sit<>stand    Consulted and Agree with Plan of Care  Patient       Patient will benefit from skilled therapeutic intervention in order to improve the following deficits and impairments:  Abnormal gait, Decreased activity tolerance, Decreased balance, Decreased endurance, Decreased knowledge of use of DME, Decreased mobility, Decreased range of motion, Decreased safety awareness, Decreased strength, Difficulty walking, Impaired perceived functional ability, Impaired flexibility, Impaired sensation, Impaired UE functional use, Improper body mechanics, Postural dysfunction  Visit Diagnosis: Unsteadiness on feet  Muscle weakness (generalized)  Other abnormalities of gait and mobility     Problem List Patient Active Problem List   Diagnosis Date Noted  . Lymphedema 10/11/2017  . Chronic venous insufficiency 10/11/2017  . Leg pain 10/11/2017  . Leg swelling 10/11/2017  . COPD (chronic obstructive pulmonary disease) (HCC) 10/11/2017  . Essential hypertension 10/11/2017   10/13/2017 PT, DPT, CSCS    12/06/2017, 3:32 PM  [place] [facility]IC[facility] [address], [place] Phone: [phone]   Fax:  [phone]  Name: VENBA ZENNER MRN: [name] Date of Birth: 1930/03/11

## 2017-12-06 NOTE — Patient Instructions (Addendum)
Leg Press 35# x 15, 55# x 10 repetitions for 2 sets   Step ups with 2 steps underneath riser x 12, x 10 for 2 sets  with 1 HHA bilaterally   Toe taps on blue foam pad with 1 hand to 2 finger assistance for medial, central, and lateral cones anterior to her COM   Side stepping on blue foam pad at treadmill with minimal to no assistance from her UEs with transitioning a ball between hands
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
## 2017-12-07 ENCOUNTER — Ambulatory Visit: Payer: Medicare Other | Admitting: Physical Therapy

## 2017-12-07 DIAGNOSIS — R2681 Unsteadiness on feet: Secondary | ICD-10-CM

## 2017-12-07 DIAGNOSIS — M25511 Pain in right shoulder: Secondary | ICD-10-CM | POA: Diagnosis not present

## 2017-12-07 DIAGNOSIS — M6281 Muscle weakness (generalized): Secondary | ICD-10-CM

## 2017-12-07 NOTE — Patient Instructions (Signed)
SLDL on blue foam pad x12 with 1 HHA x 2 sets   Side step ups x12 for 3 sets to blue side up of BOSU with 1 HHA   Single leg stance on blue side up of BOSU with 2 finger HHA bilaterally (while reciting names from letters of the alphabet for dual tasking)

## 2017-12-07 NOTE — Therapy (Signed)
[facility] [address], [place], [address] Phone: [phone]   Fax:  [phone]  Physical Therapy Treatment  Patient Details  Name: [name] MRN: [id number] Date of Birth: 1930-07-24 Referring Provider: Aram Beecham, MD   Encounter Date: 12/07/2017  PT End of Session - 12/07/17 1238    Visit Number  4    Number of Visits  13    Date for PT Re-Evaluation  01/09/18    Authorization Type  G codes 03-26-2023    PT Start Time  1123    PT Stop Time  1204    PT Time Calculation (min)  41 min    Equipment Utilized During Treatment  Gait belt    Activity Tolerance  Patient tolerated treatment well    Behavior During Therapy  [facility] for tasks assessed/performed       Past Medical History:  Diagnosis Date  . Anemia   . GERD (gastroesophageal reflux disease)   . Hypertension   . Syncope     No past surgical history on file.  There were no vitals filed for this visit.  Subjective Assessment - 12/07/17 1131    Subjective  Patient reports her legs felt a little wobbly last night, and that she felt like she got a good workout. She reports no residual pain this date.     Pertinent History  Pt is well known to this clinic, just recently d/c from this clinic on 12/6 after being seen for R shoulder pain.  Pt presents today reporting that her balance is not good and finds herself unsteady at times.  Pt has had EMGs performed BUEs and BLEs which were both negative.  Pt reports she does have numbness in L foot up to ankle at all times.  Pt reports that her balance is worse when standing still, improves when she is moving. Goals: would like to be able to walk through the grass to check on her garden. Pt with h/o ACDF C4-6 on 04/19/17. Pt has a h/o 3 surgeries in her lumbar spine.     Limitations  Standing;Walking    How long can you sit comfortably?  1 hr    How long can you stand comfortably?  10 minutes    How long can  you walk comfortably?  2 or 3 blocks    Diagnostic tests  EMG testing BLEs and BUEs which were negative    Patient Stated Goals  To improve her balance, to be able to walk her dog    Currently in Pain?  Other (Comment) Reports her R shoulder still bothers her when she reaches forwards       SLDL on blue foam pad x12 with 1 HHA x 2 sets   Side step ups x12 for 3 sets to blue side up of BOSU with 1 HHA   Single leg stance on blue side up of BOSU with 2 finger HHA bilaterally (while reciting names from letters of the alphabet for dual tasking)   Tandem gait on balance beam with 1 hand to 2 finger assistance x 6 laps in total for with mild balance deficits noted (lateral sway) however much improved from previous sessions.   Side stepping at treadmill on blue balance beam with 2 finger assistance x 6 laps total with 2 finger assistance (again noted mild balance deficits, though improving from previous sessions   Tandem gait on blue balance beam touching cones laterally with 1  HHA from PT, progressed to sidestepping on blue foam long pad with 1 HHA from PT with lateral balance deficits noted, likely from more ankle strategy for postural correction and weakness in hip musculature.   Catching and throwing plastic ball with feet together on firm surface followed by blue foam pad x 2 minutes in each condition with PT spotting patient (one loss of balance posteriorly, multiple near losses of balance in blue foam condition)                        PT Education - 12/07/17 1237    Education provided  Yes    Education Details  Technique with exercise progressions provided this date.     Person(s) Educated  Patient    Methods  Explanation;Demonstration;Tactile cues    Comprehension  Verbalized understanding;Returned demonstration;Tactile cues required       PT Short Term Goals - 11/28/17 2111      PT SHORT TERM GOAL #1   Title  Pt will be independent with HEP for carryover  between sessions    Time  2    Period  Weeks    Status  New        PT Long Term Goals - 11/30/17 1528      PT LONG TERM GOAL #1   Title  Pt will improve Berg score to at least 45/56 to demonstrate improved balance    Baseline  35/56    Time  5    Period  Weeks    Status  New      PT LONG TERM GOAL #2   Title  Pt will improve 5xSTS time to at least 15 seconds to demonstrate improved BLE strength and balance    Baseline  25:30    Time  4    Period  Weeks    Status  New      PT LONG TERM GOAL #3   Title  Pt will demonstrate BLE strength at least 4/5 throughout for improved functional mobility    Baseline  See Evaluation    Time  6    Period  Weeks    Status  New      PT LONG TERM GOAL #4   Title  Pt will be able to walk through her grass to check on her garden without LOB for improved QOL    Baseline  Unable     Time  4    Period  Weeks    Status  New      PT LONG TERM GOAL #5   Title  Pt will improve to at least 1.0 m/s for pt to transition to community ambulator    Baseline  0.58 m/s    Time  4    Period  Weeks    Status  New      Additional Long Term Goals   Additional Long Term Goals  Yes      PT LONG TERM GOAL #6   Title  Pt will improve ABC scale to at least 30% to demonstrate decreased effect of impaired balance on daily activities    Baseline  51%    Time  4    Period  Weeks    Status  New            Plan - 12/07/17 1238    Clinical Impression Statement  Patient was able to complete higher level dynamic balance activities this date on plyable  surfaces to address her balance deficits with mobility. She was able to complete many activities with 2 fingers to 1 hand held assistance which is an improvement from previous sessions. She still has balance challenges and uses ankle strategy for activities involving her UEs (passing a ball) which is a continued area of focus in clinic.     Clinical Presentation  Stable    Clinical Decision Making   Moderate    Rehab Potential  Good    PT Frequency  2x / week    PT Duration  6 weeks    PT Treatment/Interventions  ADLs/Self Care Home Management;Aquatic Therapy;Biofeedback;Cryotherapy;Electrical Stimulation;Iontophoresis 4mg /ml Dexamethasone;Moist Heat;Ultrasound;Parrafin;Fluidtherapy;Contrast Bath;DME Instruction;Gait training;Stair training;Functional mobility training;Therapeutic activities;Therapeutic exercise;Balance training;Neuromuscular re-education;Patient/family education;Orthotic Fit/Training;Manual techniques;Passive range of motion;Dry needling;Energy conservation;Taping    PT Next Visit Plan  Have pt complete ABC scale, assess HEP and progress strengthening exercises, initiate balance exercises    PT Home Exercise Plan  Sit<>stand    Consulted and Agree with Plan of Care  Patient       Patient will benefit from skilled therapeutic intervention in order to improve the following deficits and impairments:  Abnormal gait, Decreased activity tolerance, Decreased balance, Decreased endurance, Decreased knowledge of use of DME, Decreased mobility, Decreased range of motion, Decreased safety awareness, Decreased strength, Difficulty walking, Impaired perceived functional ability, Impaired flexibility, Impaired sensation, Impaired UE functional use, Improper body mechanics, Postural dysfunction  Visit Diagnosis: Unsteadiness on feet  Muscle weakness (generalized)     Problem List Patient Active Problem List   Diagnosis Date Noted  . Lymphedema 10/11/2017  . Chronic venous insufficiency 10/11/2017  . Leg pain 10/11/2017  . Leg swelling 10/11/2017  . COPD (chronic obstructive pulmonary disease) (HCC) 10/11/2017  . Essential hypertension 10/11/2017   Alva Garnet PT, DPT, CSCS    12/07/2017, 12:43 PM  [facility] PH[facility] SPORTS MEDICINE [address] [place], [address] Phone: [phone]   Fax:  [phone]  Name:  VIOLETA PAWLICKI MRN: [id number] Date of Birth: Mar 26, 1930

## 2017-12-08 ENCOUNTER — Ambulatory Visit: Payer: Medicare Other | Admitting: Physical Therapy

## 2017-12-09 NOTE — Progress Notes (Signed)
TRANISHA, TISSUE ([id number]) Visit Report for 12/06/2017 Arrival Information Details Patient Name: [name], [name]. Date of Service: 12/06/2017 10:00 AM Medical Record Number: [id number] Patient Account Number: [id number] Date of Birth/Sex: 09-05-1930 (83 y.o. Female) Treating RN: Ashok Cordia, Debi Primary Care Graden Hoshino: Aram Beecham Other Clinician: Referring Ayoub Arey: Aram Beecham Treating Senya Hinzman/Extender: Altamese [place] in Treatment: 3 Visit Information History Since Last Visit All ordered tests and consults were completed: No Patient Arrived: Gilmer Mor Added or deleted any medications: No Arrival Time: 10:19 Any new allergies or adverse reactions: No Accompanied By: self Had a fall or experienced change in No Transfer Assistance: EasyPivot Patient activities of daily living that may affect Lift risk of falls: Patient Identification Verified: Yes Signs or symptoms of abuse/neglect since last visito No Secondary Verification Process Yes Hospitalized since last visit: No Completed: Has Dressing in Place as Prescribed: Yes Patient Requires Transmission-Based No Precautions: Pain Present Now: No Patient Has Alerts: No Electronic Signature(s) Signed: 12/06/2017 4:38:32 PM By: Alejandro Mulling Entered By: Alejandro Mulling on 12/06/2017 10:19:36 [name] ([id number]) -------------------------------------------------------------------------------- Encounter Discharge Information Details Patient Name: [name] Date of Service: 12/06/2017 10:00 AM Medical Record Number: [id number] Patient Account Number: [id number] Date of Birth/Sex: June 27, 1930 (83 y.o. Female) Treating RN: Ashok Cordia, Debi Primary Care Shelva Hetzer: Aram Beecham Other Clinician: Referring Kemesha Mosey: Aram Beecham Treating Akaiya Touchette/Extender: Altamese [place] in Treatment: 3 Encounter Discharge Information Items Discharge Pain Level: 0 Discharge Condition: Stable Ambulatory  Status: Cane Discharge Destination: Home Transportation: Private Auto Accompanied By: self Schedule Follow-up Appointment: Yes Medication Reconciliation completed and No provided to Patient/Care Lynita Groseclose: Patient Clinical Summary of Care: Declined Electronic Signature(s) Signed: 12/08/2017 2:37:55 PM By: Gwenlyn Perking Previous Signature: 12/06/2017 10:26:57 AM Version By: Alejandro Mulling Entered By: Gwenlyn Perking on 12/06/2017 10:38:53 [name] ([id number]) -------------------------------------------------------------------------------- Lower Extremity Assessment Details Patient Name: [name] Date of Service: 12/06/2017 10:00 AM Medical Record Number: [id number] Patient Account Number: [id number] Date of Birth/Sex: 12/30/1929 (83 y.o. Female) Treating RN: Ashok Cordia, Debi Primary Care Kayn Haymore: Aram Beecham Other Clinician: Referring Oluwademilade Kellett: Aram Beecham Treating Donaldson Richter/Extender: Altamese [place] in Treatment: 3 Vascular Assessment Pulses: Dorsalis Pedis Palpable: [Right:Yes] Posterior Tibial Extremity colors, hair growth, and conditions: Extremity Color: [Right:Hyperpigmented] Temperature of Extremity: [Right:Warm] Capillary Refill: [Right:< 3 seconds] Toe Nail Assessment Left: Right: Thick: No Discolored: No Deformed: No Improper Length and Hygiene: No Electronic Signature(s) Signed: 12/06/2017 4:38:32 PM By: Alejandro Mulling Entered By: Alejandro Mulling on 12/06/2017 10:25:19 [name] ([id number]) -------------------------------------------------------------------------------- Multi Wound Chart Details Patient Name: [name] Date of Service: 12/06/2017 10:00 AM Medical Record Number: [id number] Patient Account Number: [id number] Date of Birth/Sex: February 09, 1930 (83 y.o. Female) Treating RN: Ashok Cordia, Debi Primary Care Kitt Minardi: Aram Beecham Other Clinician: Referring Sandon Yoho: Aram Beecham Treating  Eland Lamantia/Extender: Altamese Dougherty in Treatment: 3 Vital Signs Height(in): 60 Pulse(bpm): 72 Weight(lbs): 123 Blood Pressure(mmHg): 110/52 Body Mass Index(BMI): 24 Temperature(F): 98.5 Respiratory Rate 18 (breaths/min): Photos: [5:No Photos] [N/A:N/A] Wound Location: [5:Right Lower Leg - Anterior] [N/A:N/A] Wounding Event: [5:Trauma] [N/A:N/A] Primary Etiology: [5:Trauma, Other] [N/A:N/A] Comorbid History: [5:Hypertension, Gout, Osteoarthritis, Neuropathy] [N/A:N/A] Date Acquired: [5:09/05/2017] [N/A:N/A] Weeks of Treatment: [5:3] [N/A:N/A] Wound Status: [5:Open] [N/A:N/A] Measurements L x W x D [5:0.9x1x0.1] [N/A:N/A] (cm) Area (cm) : [5:0.707] [N/A:N/A] Volume (cm) : [5:0.071] [N/A:N/A] % Reduction in Area: [5:46.40%] [N/A:N/A] % Reduction in Volume: [5:46.20%] [N/A:N/A] Classification: [5:Full Thickness With Exposed Support Structures] [N/A:N/A] Exudate Amount: [5:Large] [N/A:N/A] Exudate Type: [5:Serous] [N/A:N/A] Exudate Color: [5:amber] [N/A:N/A] Wound Margin: [5:Distinct,  outline attached] [N/A:N/A] Granulation Amount: [5:Small (1-33%)] [N/A:N/A] Granulation Quality: [5:Red] [N/A:N/A] Necrotic Amount: [5:Large (67-100%)] [N/A:N/A] Exposed Structures: [5:Fat Layer (Subcutaneous Tissue) Exposed: Yes Fascia: No Tendon: No Muscle: No Joint: No Bone: No] [N/A:N/A] Epithelialization: [5:None] [N/A:N/A] Debridement: [5:Debridement (11042-11047)] [N/A:N/A] Pre-procedure [5:10:30] [N/A:N/A] Verification/Time Out Taken: Pain Control: [5:Lidocaine 4% Topical Solution] [N/A:N/A] Tissue Debrided: [N/A:N/A] Fibrin/Slough, Exudates, Subcutaneous Level: Skin/Subcutaneous Tissue N/A N/A Debridement Area (sq cm): 0.9 N/A N/A Instrument: Curette N/A N/A Bleeding: Minimum N/A N/A Hemostasis Achieved: Pressure N/A N/A Procedural Pain: 0 N/A N/A Post Procedural Pain: 0 N/A N/A Debridement Treatment Procedure was tolerated well N/A N/A Response: Post Debridement  0.9x1x0.2 N/A N/A Measurements L x W x D (cm) Post Debridement Volume: 0.141 N/A N/A (cm) Periwound Skin Texture: No Abnormalities Noted N/A N/A Periwound Skin Moisture: No Abnormalities Noted N/A N/A Periwound Skin Color: Erythema: Yes N/A N/A Erythema Location: Circumferential N/A N/A Temperature: No Abnormality N/A N/A Tenderness on Palpation: Yes N/A N/A Wound Preparation: Ulcer Cleansing: N/A N/A Rinsed/Irrigated with Saline Topical Anesthetic Applied: Other: lidocaine 4% Procedures Performed: Debridement N/A N/A Treatment Notes Wound #5 (Right, Anterior Lower Leg) 1. Cleansed with: Clean wound with Normal Saline 2. Anesthetic Topical Lidocaine 4% cream to wound bed prior to debridement 4. Dressing Applied: Santyl Ointment 5. Secondary Dressing Applied Bordered Foam Dressing Dry Gauze Electronic Signature(s) Signed: 12/06/2017 4:35:05 PM By: Baltazar Najjar MD Previous Signature: 12/06/2017 10:26:42 AM Version By: Alejandro Mulling Entered By: Baltazar Najjar on 12/06/2017 11:31:23 [name] ([id number]) -------------------------------------------------------------------------------- Multi-Disciplinary Care Plan Details Patient Name: [name], [name] Date of Service: 12/06/2017 10:00 AM Medical Record Number: [id number] Patient Account Number: [id number] Date of Birth/Sex: 23-Feb-1930 (83 y.o. Female) Treating RN: Ashok Cordia, Debi Primary Care Gagan Dillion: Aram Beecham Other Clinician: Referring Nicholas Trompeter: Aram Beecham Treating Mikhala Kenan/Extender: Altamese [place] in Treatment: 3 Active Inactive ` Orientation to the Wound Care Program Nursing Diagnoses: Knowledge deficit related to the wound healing center program Goals: Patient/caregiver will verbalize understanding of the Wound Healing Center Program Date Initiated: 11/14/2017 Target Resolution Date: 11/28/2017 Goal Status: Active Interventions: Provide education on orientation to the wound  center Notes: ` Wound/Skin Impairment Nursing Diagnoses: Impaired tissue integrity Knowledge deficit related to ulceration/compromised skin integrity Goals: Patient/caregiver will verbalize understanding of skin care regimen Date Initiated: 11/14/2017 Target Resolution Date: 11/28/2017 Goal Status: Active Ulcer/skin breakdown will have a volume reduction of 30% by week 4 Date Initiated: 11/14/2017 Target Resolution Date: 11/28/2017 Goal Status: Active Interventions: Assess patient/caregiver ability to obtain necessary supplies Assess patient/caregiver ability to perform ulcer/skin care regimen upon admission and as needed Assess ulceration(s) every visit Treatment Activities: Skin care regimen initiated : 11/14/2017 Notes: Electronic Signature(s) Signed: 12/06/2017 10:26:36 AM By: Vira Agar, Lawernce Keas ([id number]) Entered By: Alejandro Mulling on 12/06/2017 10:26:35 [name] ([id number]) -------------------------------------------------------------------------------- Pain Assessment Details Patient Name: [name] Date of Service: 12/06/2017 10:00 AM Medical Record Number: [id number] Patient Account Number: [id number] Date of Birth/Sex: 19-May-1930 (83 y.o. Female) Treating RN: Ashok Cordia, Debi Primary Care Jourdain Guay: Aram Beecham Other Clinician: Referring Tiran Sauseda: Aram Beecham Treating Bo Teicher/Extender: Altamese  in Treatment: 3 Active Problems Location of Pain Severity and Description of Pain Patient Has Paino No Site Locations Pain Management and Medication Current Pain Management: Electronic Signature(s) Signed: 12/06/2017 4:38:32 PM By: Alejandro Mulling Entered By: Alejandro Mulling on 12/06/2017 10:19:41 [name] ([id number]) -------------------------------------------------------------------------------- Patient/Caregiver Education Details Patient Name: [name] Date of Service: 12/06/2017 10:00  AM Medical Record Number: [id number] Patient Account Number: [id number]  Date of Birth/Gender: 07-30-1930 (83 y.o. Female) Treating RN: Phillis Haggis Primary Care Physician: Aram Beecham Other Clinician: Referring Physician: Aram Beecham Treating Physician/Extender: Altamese [place] in Treatment: 3 Education Assessment Education Provided To: Patient Education Topics Provided Wound/Skin Impairment: Handouts: Other: change dressing as ordered Methods: Demonstration, Explain/Verbal Responses: State content correctly Electronic Signature(s) Signed: 12/06/2017 4:38:32 PM By: Alejandro Mulling Entered By: Alejandro Mulling on 12/06/2017 10:27:17 [name] ([id number]) -------------------------------------------------------------------------------- Wound Assessment Details Patient Name: [name] Date of Service: 12/06/2017 10:00 AM Medical Record Number: [id number] Patient Account Number: [id number] Date of Birth/Sex: Jan 06, 1930 (83 y.o. Female) Treating RN: Ashok Cordia, Debi Primary Care Deryn Massengale: Aram Beecham Other Clinician: Referring Natlie Asfour: Aram Beecham Treating Kerisha Goughnour/Extender: Altamese [place] in Treatment: 3 Wound Status Wound Number: 5 Primary Etiology: Trauma, Other Wound Location: Right Lower Leg - Anterior Wound Status: Open Wounding Event: Trauma Comorbid Hypertension, Gout, Osteoarthritis, History: Neuropathy Date Acquired: 09/05/2017 Weeks Of Treatment: 3 Clustered Wound: No Photos Photo Uploaded By: Alejandro Mulling on 12/06/2017 14:43:12 Wound Measurements Length: (cm) 0.9 Width: (cm) 1 Depth: (cm) 0.1 Area: (cm) 0.707 Volume: (cm) 0.071 % Reduction in Area: 46.4% % Reduction in Volume: 46.2% Epithelialization: None Tunneling: No Undermining: No Wound Description Full Thickness With Exposed Support Foul Classification: Structures Slou Wound Margin: Distinct, outline attached Exudate Large Amount: Exudate  Type: Serous Exudate Color: amber Odor After Cleansing: No gh/Fibrino Yes Wound Bed Granulation Amount: Small (1-33%) Exposed Structure Granulation Quality: Red Fascia Exposed: No Necrotic Amount: Large (67-100%) Fat Layer (Subcutaneous Tissue) Exposed: Yes Necrotic Quality: Adherent Slough Tendon Exposed: No Muscle Exposed: No Joint Exposed: No Bone Exposed: No [name], [name] ([id number]) Periwound Skin Texture Texture Color No Abnormalities Noted: No No Abnormalities Noted: No Erythema: Yes Moisture Erythema Location: Circumferential No Abnormalities Noted: No Temperature / Pain Temperature: No Abnormality Tenderness on Palpation: Yes Wound Preparation Ulcer Cleansing: Rinsed/Irrigated with Saline Topical Anesthetic Applied: Other: lidocaine 4%, Treatment Notes Wound #5 (Right, Anterior Lower Leg) 1. Cleansed with: Clean wound with Normal Saline 2. Anesthetic Topical Lidocaine 4% cream to wound bed prior to debridement 4. Dressing Applied: Santyl Ointment 5. Secondary Dressing Applied Bordered Foam Dressing Dry Gauze Electronic Signature(s) Signed: 12/06/2017 4:38:32 PM By: Alejandro Mulling Entered By: Alejandro Mulling on 12/06/2017 10:24:40 [name] ([id number]) -------------------------------------------------------------------------------- Vitals Details Patient Name: [name] Date of Service: 12/06/2017 10:00 AM Medical Record Number: [id number] Patient Account Number: [id number] Date of Birth/Sex: February 17, 1930 (83 y.o. Female) Treating RN: Ashok Cordia, Debi Primary Care Mirela Parsley: Aram Beecham Other Clinician: Referring Zayaan Kozak: Aram Beecham Treating Jozlin Bently/Extender: Altamese [place] in Treatment: 3 Vital Signs Time Taken: 10:19 Temperature (F): 98.5 Height (in): 60 Pulse (bpm): 72 Weight (lbs): 123 Respiratory Rate (breaths/min): 18 Body Mass Index (BMI): 24 Blood Pressure (mmHg): 110/52 Reference Range: 80 - 120 mg  / dl Electronic Signature(s) Signed: 12/06/2017 4:38:32 PM By: Alejandro Mulling Entered By: Alejandro Mulling on 12/06/2017 10:22:35

## 2017-12-11 ENCOUNTER — Ambulatory Visit: Payer: Medicare Other | Admitting: Physical Therapy

## 2017-12-11 DIAGNOSIS — M6281 Muscle weakness (generalized): Secondary | ICD-10-CM

## 2017-12-11 DIAGNOSIS — R2681 Unsteadiness on feet: Secondary | ICD-10-CM

## 2017-12-11 DIAGNOSIS — R2689 Other abnormalities of gait and mobility: Secondary | ICD-10-CM

## 2017-12-11 DIAGNOSIS — M25511 Pain in right shoulder: Secondary | ICD-10-CM | POA: Diagnosis not present

## 2017-12-11 NOTE — Therapy (Signed)
[place] [facility] [address] [place], [address] Phone: [phone]   Fax:  [phone]  Physical Therapy Treatment  Patient Details  Name: [name] MRN: [id number] Date of Birth: 21-Jul-1930 Referring Provider: Aram Beecham, MD   Encounter Date: 12/11/2017  PT End of Session - 12/11/17 1601    Visit Number  5    Number of Visits  13    Date for PT Re-Evaluation  01/09/18    Authorization Type  G codes 5/10    PT Start Time  1430    PT Stop Time  1520    PT Time Calculation (min)  50 min    Equipment Utilized During Treatment  Gait belt    Activity Tolerance  Patient tolerated treatment well    Behavior During Therapy  [facility] for tasks assessed/performed       Past Medical History:  Diagnosis Date  . Anemia   . GERD (gastroesophageal reflux disease)   . Hypertension   . Syncope     No past surgical history on file.  There were no vitals filed for this visit.  Subjective Assessment - 12/11/17 1558    Subjective  Pt states she feels she has good and bad days in regards to her balance and today "feels like a bad day."  She reports she has no pain upon arrival and has been compliant with her HEP.    Pertinent History  Pt is well known to this clinic, just recently d/c from this clinic on 12/6 after being seen for R shoulder pain.  Pt reports that her balance is not good and finds herself unsteady at times.  Pt has had EMGs performed BUEs and BLEs which were both negative.  Pt reports she does have numbness in L foot up to ankle at all times.  Pt reports that her balance is worse when standing still, improves when she is moving. Goals: would like to be able to walk through the grass to check on her garden. Pt with h/o ACDF C4-6 on 04/19/17. Pt has a h/o 3 surgeries in her lumbar spine.     Limitations  Standing;Walking    How long can you sit comfortably?  1 hr    How long can you stand comfortably?  10  minutes    How long can you walk comfortably?  2 or 3 blocks    Diagnostic tests  EMG testing BLEs and BUEs which were negative    Patient Stated Goals  To improve her balance, to be able to walk her dog    Currently in Pain?  No/denies    Pain Onset  More than a month ago    Multiple Pain Sites  No        Treatment:  SLS on floor and on AirEx with EO and EC and fingertip hold on bar and CGAx1; Tandem stance on floor and on AirEx foam pad with EO and EC and fingertip hold on bar with CGAx1; tandem gait along bar with CGA/minAx1; balancing on half foam roller with B feet with 1 finger hold on bar; Alt toe tapping on 6" step with fingertip to no hold with SBA to CGAx1; sit to stands from chair 2x10; seated leg press machine 35# 30x; 55# 25x.                      PT Education - 12/11/17 1600    Education provided  Yes    Education Details  Reviewed safe ways to work on NMR/balance at kitchen sink (SLS and tandem stance) with pt today    Person(s) Educated  Patient    Methods  Explanation;Demonstration    Comprehension  Verbalized understanding;Returned demonstration;Tactile cues required       PT Short Term Goals - 12/11/17 1604      PT SHORT TERM GOAL #1   Title  Pt will be independent with HEP for carryover between sessions    Time  2    Period  Weeks    Status  New        PT Long Term Goals - 12/11/17 1604      PT LONG TERM GOAL #1   Title  Pt will improve Berg score to at least 45/56 to demonstrate improved balance    Baseline  35/56    Time  5    Period  Weeks    Status  New      PT LONG TERM GOAL #2   Title  Pt will improve 5xSTS time to at least 15 seconds to demonstrate improved BLE strength and balance    Baseline  25:30    Time  4    Period  Weeks    Status  New      PT LONG TERM GOAL #3   Title  Pt will demonstrate BLE strength at least 4/5 throughout for improved functional mobility    Baseline  See Evaluation    Time  6    Period   Weeks    Status  New      PT LONG TERM GOAL #4   Title  Pt will be able to walk through her grass to check on her garden without LOB for improved QOL    Baseline  Unable     Time  4    Period  Weeks    Status  New      PT LONG TERM GOAL #5   Title  Pt will improve to at least 1.0 m/s for pt to transition to community ambulator    Baseline  0.58 m/s    Time  4    Period  Weeks    Status  New      PT LONG TERM GOAL #6   Title  Pt will improve ABC scale to at least 30% to demonstrate decreased effect of impaired balance on daily activities    Baseline  51%    Time  4    Period  Weeks    Status  New            Plan - 12/11/17 1602    Clinical Impression Statement  Pt continues to need at least 1-2 finger hold with most dynamic balance activities.  She needs frequent verbal cueing to remember to use as little "holding on" as possible with NMR activities.  She had one LOB on Airex pad posteriorly, but was able to correct herself.    History and Personal Factors relevant to plan of care:  B shoulder pain; multiple comorbidities    Clinical Presentation  Stable    Clinical Decision Making  Moderate    Rehab Potential  Good    PT Frequency  2x / week    PT Duration  6 weeks    PT Treatment/Interventions  ADLs/Self Care Home Management;Aquatic Therapy;Biofeedback;Cryotherapy;Electrical Stimulation;Iontophoresis 4mg /ml Dexamethasone;Moist Heat;Ultrasound;Parrafin;Fluidtherapy;Contrast Bath;DME Instruction;Gait training;Stair training;Functional mobility training;Therapeutic activities;Therapeutic exercise;Balance training;Neuromuscular re-education;Patient/family education;Orthotic Fit/Training;Manual techniques;Passive  range of motion;Dry needling;Energy conservation;Taping    PT Next Visit Plan  Have pt complete ABC scale, assess HEP and progress strengthening exercises, initiate balance exercises    PT Home Exercise Plan  Sit<>stand    Consulted and Agree with Plan of Care   Patient       Patient will benefit from skilled therapeutic intervention in order to improve the following deficits and impairments:  Abnormal gait, Decreased activity tolerance, Decreased balance, Decreased endurance, Decreased knowledge of use of DME, Decreased mobility, Decreased range of motion, Decreased safety awareness, Decreased strength, Difficulty walking, Impaired perceived functional ability, Impaired flexibility, Impaired sensation, Impaired UE functional use, Improper body mechanics, Postural dysfunction  Visit Diagnosis: Unsteadiness on feet  Muscle weakness (generalized)  Other abnormalities of gait and mobility     Problem List Patient Active Problem List   Diagnosis Date Noted  . Lymphedema 10/11/2017  . Chronic venous insufficiency 10/11/2017  . Leg pain 10/11/2017  . Leg swelling 10/11/2017  . COPD (chronic obstructive pulmonary disease) (HCC) 10/11/2017  . Essential hypertension 10/11/2017    Illona Bulman, MPT 12/11/2017, 4:06 PM  [place] [facility] [address] [place], [address] Phone: [phone]   Fax:  [phone]  Name: [name] MRN: [id number] Date of Birth: 10-May-1930

## 2017-12-13 ENCOUNTER — Encounter: Payer: Medicare Other | Attending: Internal Medicine | Admitting: Internal Medicine

## 2017-12-13 ENCOUNTER — Ambulatory Visit: Payer: Medicare Other | Attending: Internal Medicine | Admitting: Physical Therapy

## 2017-12-13 DIAGNOSIS — M79604 Pain in right leg: Secondary | ICD-10-CM | POA: Insufficient documentation

## 2017-12-13 DIAGNOSIS — R2681 Unsteadiness on feet: Secondary | ICD-10-CM | POA: Insufficient documentation

## 2017-12-13 DIAGNOSIS — X58XXXA Exposure to other specified factors, initial encounter: Secondary | ICD-10-CM | POA: Insufficient documentation

## 2017-12-13 DIAGNOSIS — I10 Essential (primary) hypertension: Secondary | ICD-10-CM | POA: Diagnosis not present

## 2017-12-13 DIAGNOSIS — Z950 Presence of cardiac pacemaker: Secondary | ICD-10-CM | POA: Insufficient documentation

## 2017-12-13 DIAGNOSIS — M419 Scoliosis, unspecified: Secondary | ICD-10-CM | POA: Insufficient documentation

## 2017-12-13 DIAGNOSIS — M79605 Pain in left leg: Secondary | ICD-10-CM | POA: Diagnosis present

## 2017-12-13 DIAGNOSIS — R2689 Other abnormalities of gait and mobility: Secondary | ICD-10-CM | POA: Insufficient documentation

## 2017-12-13 DIAGNOSIS — L97812 Non-pressure chronic ulcer of other part of right lower leg with fat layer exposed: Secondary | ICD-10-CM | POA: Diagnosis not present

## 2017-12-13 DIAGNOSIS — G5602 Carpal tunnel syndrome, left upper limb: Secondary | ICD-10-CM | POA: Insufficient documentation

## 2017-12-13 DIAGNOSIS — S81811A Laceration without foreign body, right lower leg, initial encounter: Secondary | ICD-10-CM | POA: Diagnosis not present

## 2017-12-13 DIAGNOSIS — M6281 Muscle weakness (generalized): Secondary | ICD-10-CM | POA: Insufficient documentation

## 2017-12-13 NOTE — Patient Instructions (Addendum)
Leg Press - 65# x 15, 95# x12 (felt a little in her back) decreased to 85# x 12  Step ups to 2 risers with 1# weight x 3 sets x 10 repetitions  Side step ups to 2 risers with bilateral min A x 10 for 2 sets bilaterally   Side stepping through agility ladder and in open gym space with bilateral UE support from therapist   Gait mechanics without AD

## 2017-12-14 NOTE — Therapy (Signed)
[facility] [facility] [address], [place], [address] Phone: [phone]   Fax:  [phone]  Physical Therapy Treatment  Patient Details  Name: [name] MRN: [id number] Date of Birth: [birth year] Referring Provider: Aram Beecham, MD   Encounter Date: 12/13/2017  PT End of Session - 12/13/17 1412    Visit Number  6    Number of Visits  13    Date for PT Re-Evaluation  01/09/18    Authorization Type  G codes 6/10    PT Start Time  1351    PT Stop Time  1430    PT Time Calculation (min)  39 min    Equipment Utilized During Treatment  Gait belt    Activity Tolerance  Patient tolerated treatment well    Behavior During Therapy  [facility] for tasks assessed/performed       Past Medical History:  Diagnosis Date  . Anemia   . GERD (gastroesophageal reflux disease)   . Hypertension   . Syncope     No past surgical history on file.  There were no vitals filed for this visit.  Subjective Assessment - 12/14/17 1233    Subjective  Patient reports she is feeling a bit "wobbly" today but has felt her LEs are much stronger than they used to be.     Pertinent History  Pt is well known to this clinic, just recently d/c from this clinic on 12/6 after being seen for R shoulder pain.  Pt reports that her balance is not good and finds herself unsteady at times.  Pt has had EMGs performed BUEs and BLEs which were both negative.  Pt reports she does have numbness in L foot up to ankle at all times.  Pt reports that her balance is worse when standing still, improves when she is moving. Goals: would like to be able to walk through the grass to check on her garden. Pt with h/o ACDF C4-6 on 04/19/17. Pt has a h/o 3 surgeries in her lumbar spine.     Limitations  Standing;Walking    How long can you sit comfortably?  1 hr    How long can you stand comfortably?  10 minutes    How long can you walk comfortably?  2 or 3 blocks    Diagnostic tests  EMG testing BLEs and BUEs which were negative    Patient Stated Goals  To improve her balance, to be able to walk her dog    Currently in Pain?  No/denies       Leg Press - 65# x 15, 95# x12 (felt a little in her back) decreased to 85# x 12  Step ups to 2 risers with 1# weight x 3 sets x 10 repetitions  Side step ups to 2 risers with bilateral min A x 10 for 2 sets bilaterally   Side stepping through agility ladder and in open gym space with bilateral UE support from therapist with random tasks included (stepping L vs R when audibly cued by therapist)   Gait mechanics without AD with improved stride length and decreased lateral sway noted when she was cued to ambulate with more confidence (improved stride length without AD).   Side stepping and tandem stepping on blue foam pad with very minimal assistance from 1 UE with no loss of balance, just mild sway.  PT Education - 12/14/17 1234    Education provided  Yes    Education Details  Educated patient on need to be more confident in gait pattern without AD.     Person(s) Educated  Patient    Methods  Explanation;Demonstration    Comprehension  Verbalized understanding;Returned demonstration       PT Short Term Goals - 12/11/17 1604      PT SHORT TERM GOAL #1   Title  Pt will be independent with HEP for carryover between sessions    Time  2    Period  Weeks    Status  New        PT Long Term Goals - 12/11/17 1604      PT LONG TERM GOAL #1   Title  Pt will improve Berg score to at least 45/56 to demonstrate improved balance    Baseline  35/56    Time  5    Period  Weeks    Status  New      PT LONG TERM GOAL #2   Title  Pt will improve 5xSTS time to at least 15 seconds to demonstrate improved BLE strength and balance    Baseline  25:30    Time  4    Period  Weeks    Status  New      PT LONG TERM GOAL #3   Title  Pt will demonstrate BLE strength at least 4/5  throughout for improved functional mobility    Baseline  See Evaluation    Time  6    Period  Weeks    Status  New      PT LONG TERM GOAL #4   Title  Pt will be able to walk through her grass to check on her garden without LOB for improved QOL    Baseline  Unable     Time  4    Period  Weeks    Status  New      PT LONG TERM GOAL #5   Title  Pt will improve to at least 1.0 m/s for pt to transition to community ambulator    Baseline  0.58 m/s    Time  4    Period  Weeks    Status  New      PT LONG TERM GOAL #6   Title  Pt will improve ABC scale to at least 30% to demonstrate decreased effect of impaired balance on daily activities    Baseline  51%    Time  4    Period  Weeks    Status  New            Plan - 12/13/17 1419    Clinical Impression Statement  Patient demonstrates gait pattern without AD that is indicative of lack of confidence (decreased stride length) but no over instability. She was cued to increase stride length and that her balance is likely much better than what she believes. Even with higher level balance tasks she requires only very minimal assistance from her UEs to maintain her balance (much improved from initial PT sessions).     Clinical Presentation  Stable    Clinical Decision Making  Moderate    Rehab Potential  Good    PT Frequency  2x / week    PT Duration  6 weeks    PT Treatment/Interventions  ADLs/Self Care Home Management;Aquatic Therapy;Biofeedback;Cryotherapy;Electrical Stimulation;Iontophoresis 4mg /ml Dexamethasone;Moist Heat;Ultrasound;Parrafin;Fluidtherapy;Contrast Bath;DME Instruction;Gait training;Stair training;Functional mobility training;Therapeutic activities;Therapeutic exercise;Balance  training;Neuromuscular re-education;Patient/family education;Orthotic Fit/Training;Manual techniques;Passive range of motion;Dry needling;Energy conservation;Taping    PT Next Visit Plan  Have pt complete ABC scale, assess HEP and progress  strengthening exercises, initiate balance exercises    PT Home Exercise Plan  Sit<>stand    Consulted and Agree with Plan of Care  Patient       Patient will benefit from skilled therapeutic intervention in order to improve the following deficits and impairments:  Abnormal gait, Decreased activity tolerance, Decreased balance, Decreased endurance, Decreased knowledge of use of DME, Decreased mobility, Decreased range of motion, Decreased safety awareness, Decreased strength, Difficulty walking, Impaired perceived functional ability, Impaired flexibility, Impaired sensation, Impaired UE functional use, Improper body mechanics, Postural dysfunction  Visit Diagnosis: Unsteadiness on feet  Muscle weakness (generalized)  Other abnormalities of gait and mobility     Problem List Patient Active Problem List   Diagnosis Date Noted  . Lymphedema 10/11/2017  . Chronic venous insufficiency 10/11/2017  . Leg pain 10/11/2017  . Leg swelling 10/11/2017  . COPD (chronic obstructive pulmonary disease) (HCC) 10/11/2017  . Essential hypertension 10/11/2017   Alva Garnet PT, DPT, CSCS    12/14/2017, 12:39 PM  [place] [facility] [address], [place], [address] Phone: [phone]   Fax:  [phone]  Name: DELOISE BROSIUS MRN: [id number] Date of Birth: June 30, 1930

## 2017-12-15 NOTE — Progress Notes (Signed)
Natasha, Barnett ([id number]) Visit Report for 12/13/2017 Arrival Information Details Patient Name: [name], [name] Date of Service: 12/13/2017 10:00 AM Medical Record Number: [id number] Patient Account Number: [id number] Date of Birth/Sex: [birth year] (84 y.o. Female) Treating RN: Huel Coventry Primary Care Dekker Verga: Aram Beecham Other Clinician: Referring Zylie Mumaw: Aram Beecham Treating Nijah Tejera/Extender: Altamese [place] in Treatment: 4 Visit Information History Since Last Visit Added or deleted any medications: No Patient Arrived: [place] Any new allergies or adverse reactions: No Arrival Time: 10:17 Had a fall or experienced change in No Accompanied By: self activities of daily living that may affect Transfer Assistance: None risk of falls: Patient Identification Verified: Yes Signs or symptoms of abuse/neglect since last visito No Secondary Verification Process Completed: Yes Hospitalized since last visit: No Patient Requires Transmission-Based Precautions: No Has Dressing in Place as Prescribed: Yes Patient Has Alerts: No Pain Present Now: No Electronic Signature(s) Signed: 12/13/2017 10:25:54 AM By: Elliot Gurney, BSN, RN, CWS, Kim RN, BSN Entered By: Elliot Gurney, BSN, RN, CWS, Kim on 12/13/2017 10:25:53 [name] ([id number]) -------------------------------------------------------------------------------- Clinic Level of Care Assessment Details Patient Name: [name] Date of Service: 12/13/2017 10:00 AM Medical Record Number: [id number] Patient Account Number: [id number] Date of Birth/Sex: 10/19/1930 (84 y.o. Female) Treating RN: Huel Coventry Primary Care Cheyanne Lamison: Aram Beecham Other Clinician: Referring Macgregor Aeschliman: Aram Beecham Treating Orville Mena/Extender: Altamese [place] in Treatment: 4 Clinic Level of Care Assessment Items TOOL 4 Quantity Score []  - Use when only an EandM is performed on FOLLOW-UP visit 0 ASSESSMENTS - Nursing Assessment /  Reassessment []  - Reassessment of Co-morbidities (includes updates in patient status) 0 []  - 0 Reassessment of Adherence to Treatment Plan ASSESSMENTS - Wound and Skin Assessment / Reassessment X - Simple Wound Assessment / Reassessment - one wound 1 5 []  - 0 Complex Wound Assessment / Reassessment - multiple wounds []  - 0 Dermatologic / Skin Assessment (not related to wound area) ASSESSMENTS - Focused Assessment []  - Circumferential Edema Measurements - multi extremities 0 []  - 0 Nutritional Assessment / Counseling / Intervention []  - 0 Lower Extremity Assessment (monofilament, tuning fork, pulses) []  - 0 Peripheral Arterial Disease Assessment (using hand held doppler) ASSESSMENTS - Ostomy and/or Continence Assessment and Care []  - Incontinence Assessment and Management 0 []  - 0 Ostomy Care Assessment and Management (repouching, etc.) PROCESS - Coordination of Care X - Simple Patient / Family Education for ongoing care 1 15 []  - 0 Complex (extensive) Patient / Family Education for ongoing care []  - 0 Staff obtains Chiropractor, Records, Test Results / Process Orders []  - 0 Staff telephones HHA, Nursing Homes / Clarify orders / etc []  - 0 Routine Transfer to another Facility (non-emergent condition) []  - 0 Routine Hospital Admission (non-emergent condition) []  - 0 New Admissions / Manufacturing engineer / Ordering NPWT, Apligraf, etc. []  - 0 Emergency Hospital Admission (emergent condition) X- 1 10 Simple Discharge Coordination SHAILYNN, FONG ([id number]) []  - 0 Complex (extensive) Discharge Coordination PROCESS - Special Needs []  - Pediatric / Minor Patient Management 0 []  - 0 Isolation Patient Management []  - 0 Hearing / Language / Visual special needs []  - 0 Assessment of Community assistance (transportation, D/C planning, etc.) []  - 0 Additional assistance / Altered mentation []  - 0 Support Surface(s) Assessment (bed, cushion, seat, etc.) INTERVENTIONS -  Wound Cleansing / Measurement X - Simple Wound Cleansing - one wound 1 5 []  - 0 Complex Wound Cleansing - multiple wounds X- 1 5 Wound Imaging (photographs - any  number of wounds) []  - 0 Wound Tracing (instead of photographs) X- 1 5 Simple Wound Measurement - one wound []  - 0 Complex Wound Measurement - multiple wounds INTERVENTIONS - Wound Dressings []  - Small Wound Dressing one or multiple wounds 0 X- 1 15 Medium Wound Dressing one or multiple wounds []  - 0 Large Wound Dressing one or multiple wounds []  - 0 Application of Medications - topical []  - 0 Application of Medications - injection INTERVENTIONS - Miscellaneous []  - External ear exam 0 []  - 0 Specimen Collection (cultures, biopsies, blood, body fluids, etc.) []  - 0 Specimen(s) / Culture(s) sent or taken to Lab for analysis []  - 0 Patient Transfer (multiple staff / Nurse, adult / Similar devices) []  - 0 Simple Staple / Suture removal (25 or less) []  - 0 Complex Staple / Suture removal (26 or more) []  - 0 Hypo / Hyperglycemic Management (close monitor of Blood Glucose) []  - 0 Ankle / Brachial Index (ABI) - do not check if billed separately X- 1 5 Vital Signs SYNETTA, GOGUE ([id number]) Has the patient been seen at the hospital within the last three years: Yes Total Score: 65 Level Of Care: New/Established - Level 2 Electronic Signature(s) Signed: 12/14/2017 5:50:18 PM By: Elliot Gurney, BSN, RN, CWS, Kim RN, BSN Entered By: Elliot Gurney, BSN, RN, CWS, Kim on 12/13/2017 10:47:36 [name] ([id number]) -------------------------------------------------------------------------------- Encounter Discharge Information Details Patient Name: [name] Date of Service: 12/13/2017 10:00 AM Medical Record Number: [id number] Patient Account Number: [id number] Date of Birth/Sex: 1930-08-29 (84 y.o. Female) Treating RN: Huel Coventry Primary Care Betty Daidone: Aram Beecham Other Clinician: Referring Renda Pohlman: Aram Beecham Treating Yoltzin Barg/Extender: Altamese [place] in Treatment: 4 Encounter Discharge Information Items Schedule Follow-up Appointment: No Medication Reconciliation completed and No provided to Patient/Care Aishah Teffeteller: Provided on Clinical Summary of Care: 12/13/2017 Form Type Recipient Paper Patient [facility] Electronic Signature(s) Signed: 12/15/2017 8:42:55 AM By: Gwenlyn Perking Entered By: Gwenlyn Perking on 12/13/2017 10:44:55 [name] ([id number]) -------------------------------------------------------------------------------- Lower Extremity Assessment Details Patient Name: [name] Date of Service: 12/13/2017 10:00 AM Medical Record Number: [id number] Patient Account Number: [id number] Date of Birth/Sex: 1930-10-27 (84 y.o. Female) Treating RN: Huel Coventry Primary Care Janel Beane: Aram Beecham Other Clinician: Referring Jamillia Closson: Aram Beecham Treating Derian Dimalanta/Extender: Altamese [place] in Treatment: 4 Vascular Assessment Pulses: Dorsalis Pedis Palpable: [Right:Yes] Posterior Tibial Extremity colors, hair growth, and conditions: Extremity Color: [Right:Hyperpigmented] Hair Growth on Extremity: [Right:No] Temperature of Extremity: [Right:Warm] Capillary Refill: [Right:< 3 seconds] Toe Nail Assessment Left: Right: Thick: No Discolored: No Deformed: No Improper Length and Hygiene: No Electronic Signature(s) Signed: 12/14/2017 5:50:18 PM By: Elliot Gurney, BSN, RN, CWS, Kim RN, BSN Entered By: Elliot Gurney, BSN, RN, CWS, Kim on 12/13/2017 10:22:23 [name] ([id number]) -------------------------------------------------------------------------------- Multi Wound Chart Details Patient Name: [name] Date of Service: 12/13/2017 10:00 AM Medical Record Number: [id number] Patient Account Number: [id number] Date of Birth/Sex: 06/22/1930 (84 y.o. Female) Treating RN: Huel Coventry Primary Care Yan Okray: Aram Beecham Other Clinician: Referring Vernon Maish:  Aram Beecham Treating Britlyn Martine/Extender: Altamese [place] in Treatment: 4 Vital Signs Height(in): 60 Pulse(bpm): 65 Weight(lbs): 123 Blood Pressure(mmHg): 145/50 Body Mass Index(BMI): 24 Temperature(F): 97.8 Respiratory Rate 16 (breaths/min): Photos: [N/A:N/A] Wound Location: Right Lower Leg - Anterior N/A N/A Wounding Event: Trauma N/A N/A Primary Etiology: Trauma, Other N/A N/A Comorbid History: Hypertension, Gout, N/A N/A Osteoarthritis, Neuropathy Date Acquired: 09/05/2017 N/A N/A Weeks of Treatment: 4 N/A N/A Wound Status: Open N/A N/A Measurements L x W x  D 1x0.9x0.1 N/A N/A (cm) Area (cm) : 0.707 N/A N/A Volume (cm) : 0.071 N/A N/A % Reduction in Area: 46.40% N/A N/A % Reduction in Volume: 46.20% N/A N/A Classification: Full Thickness With Exposed N/A N/A Support Structures Exudate Amount: Large N/A N/A Exudate Type: Serous N/A N/A Exudate Color: amber N/A N/A Wound Margin: Distinct, outline attached N/A N/A Granulation Amount: Medium (34-66%) N/A N/A Granulation Quality: Pink N/A N/A Necrotic Amount: Medium (34-66%) N/A N/A Exposed Structures: Fat Layer (Subcutaneous N/A N/A Tissue) Exposed: Yes Fascia: No Tendon: No Muscle: No Joint: No Bone: No Epithelialization: None N/A N/A DAELYN, PETTAWAY ([id number]) Periwound Skin Texture: No Abnormalities Noted N/A N/A Periwound Skin Moisture: No Abnormalities Noted N/A N/A Periwound Skin Color: Erythema: Yes N/A N/A Erythema Location: Circumferential N/A N/A Temperature: No Abnormality N/A N/A Tenderness on Palpation: Yes N/A N/A Wound Preparation: Ulcer Cleansing: N/A N/A Rinsed/Irrigated with Saline Topical Anesthetic Applied: Other: lidocaine 4% Treatment Notes Electronic Signature(s) Signed: 12/13/2017 5:48:27 PM By: Baltazar Najjar MD Entered By: Baltazar Najjar on 12/13/2017 10:48:28 [name]  ([id number]) -------------------------------------------------------------------------------- Multi-Disciplinary Care Plan Details Patient Name: AMYLIA, COLLAZOS Date of Service: 12/13/2017 10:00 AM Medical Record Number: [id number] Patient Account Number: [id number] Date of Birth/Sex: 1930-11-28 (84 y.o. Female) Treating RN: Huel Coventry Primary Care Lajuan Godbee: Aram Beecham Other Clinician: Referring Caryl Fate: Aram Beecham Treating Elizabella Nolet/Extender: Altamese [place] in Treatment: 4 Active Inactive ` Orientation to the Wound Care Program Nursing Diagnoses: Knowledge deficit related to the wound healing center program Goals: Patient/caregiver will verbalize understanding of the Wound Healing Center Program Date Initiated: 11/14/2017 Target Resolution Date: 11/28/2017 Goal Status: Active Interventions: Provide education on orientation to the wound center Notes: ` Wound/Skin Impairment Nursing Diagnoses: Impaired tissue integrity Knowledge deficit related to ulceration/compromised skin integrity Goals: Patient/caregiver will verbalize understanding of skin care regimen Date Initiated: 11/14/2017 Target Resolution Date: 11/28/2017 Goal Status: Active Ulcer/skin breakdown will have a volume reduction of 30% by week 4 Date Initiated: 11/14/2017 Target Resolution Date: 11/28/2017 Goal Status: Active Interventions: Assess patient/caregiver ability to obtain necessary supplies Assess patient/caregiver ability to perform ulcer/skin care regimen upon admission and as needed Assess ulceration(s) every visit Treatment Activities: Skin care regimen initiated : 11/14/2017 Notes: Electronic Signature(s) Signed: 12/14/2017 5:50:18 PM By: Elliot Gurney, BSN, RN, CWS, Kim RN, BSN [address] [place] ([id number]) Entered By: Elliot Gurney, BSN, RN, CWS, Kim on 12/13/2017 10:24:07 [name] ([id number]) -------------------------------------------------------------------------------- Pain Assessment  Details Patient Name: [name] Date of Service: 12/13/2017 10:00 AM Medical Record Number: [id number] Patient Account Number: [id number] Date of Birth/Sex: 23-Aug-1930 (84 y.o. Female) Treating RN: Huel Coventry Primary Care Taniya Dasher: Aram Beecham Other Clinician: Referring Akisha Sturgill: Aram Beecham Treating Kanan Sobek/Extender: Altamese  in Treatment: 4 Active Problems Location of Pain Severity and Description of Pain Patient Has Paino No Site Locations With Dressing Change: No Pain Management and Medication Current Pain Management: Goals for Pain Management Topical or injectable lidocaine is offered to patient for acute pain when surgical debridement is performed. If needed, Patient is instructed to use over the counter pain medication for the following 24-48 hours after debridement. Wound care MDs do not prescribed pain medications. Patient has chronic pain or uncontrolled pain. Patient has been instructed to make an appointment with their Primary Care Physician for pain management. Electronic Signature(s) Signed: 12/14/2017 5:50:18 PM By: Elliot Gurney, BSN, RN, CWS, Kim RN, BSN Entered By: Elliot Gurney, BSN, RN, CWS, Kim on 12/13/2017 10:17:34 [name] ([id number]) -------------------------------------------------------------------------------- Wound Assessment Details Patient Name: [place] Date of Service: 12/13/2017 10:00 AM Medical Record Number: [id number] Patient Account Number: [id number] Date of Birth/Sex: [birth year] (84 y.o. Female) Treating RN: Huel Coventry Primary Care Dorrance Sellick: Aram Beecham Other Clinician: Referring Suda Forbess: Aram Beecham Treating Brendolyn Stockley/Extender: Altamese [name] in Treatment: 4 Wound Status Wound Number: 5 Primary Etiology: Trauma, Other Wound Location: Right Lower Leg - Anterior Wound Status: Open Wounding Event: Trauma Comorbid Hypertension, Gout, Osteoarthritis, History: Neuropathy Date Acquired:  09/05/2017 Weeks Of Treatment: 4 Clustered Wound: No Photos Wound Measurements Length: (cm) 1 Width: (cm) 0.9 Depth: (cm) 0.1 Area: (cm) 0.707 Volume: (cm) 0.071 % Reduction in Area: 46.4% % Reduction in Volume: 46.2% Epithelialization: None Tunneling: No Undermining: No Wound Description Full Thickness With Exposed Support Foul O Classification: Structures Slough Wound Margin: Distinct, outline attached Exudate Large Amount: Exudate Type: Serous Exudate Color: amber dor After Cleansing: No /Fibrino Yes Wound Bed Granulation Amount: Medium (34-66%) Exposed Structure Granulation Quality: Pink Fascia Exposed: No Necrotic Amount: Medium (34-66%) Fat Layer (Subcutaneous Tissue) Exposed: Yes Necrotic Quality: Adherent Slough Tendon Exposed: No Muscle Exposed: No Joint Exposed: No Bone Exposed: No Periwound Skin Texture Texture Color No Abnormalities Noted: No No Abnormalities Noted: No TASHEEMA, PERRONE ([id number]) Moisture Erythema: Yes No Abnormalities Noted: No Erythema Location: Circumferential Temperature / Pain Temperature: No Abnormality Tenderness on Palpation: Yes Wound Preparation Ulcer Cleansing: Rinsed/Irrigated with Saline Topical Anesthetic Applied: Other: lidocaine 4%, Electronic Signature(s) Signed: 12/14/2017 5:50:18 PM By: Elliot Gurney, BSN, RN, CWS, Kim RN, BSN Entered By: Elliot Gurney, BSN, RN, CWS, Kim on 12/13/2017 10:21:42 [name] ([id number]) -------------------------------------------------------------------------------- Vitals Details Patient Name: [name] Date of Service: 12/13/2017 10:00 AM Medical Record Number: [id number] Patient Account Number: [id number] Date of Birth/Sex: [birth year] (84 y.o. Female) Treating RN: Huel Coventry Primary Care Meilech Virts: Aram Beecham Other Clinician: Referring Musette Kisamore: Aram Beecham Treating Vanesa Renier/Extender: Altamese [place] in Treatment: 4 Vital Signs Time Taken: 10:17 Temperature  (F): 97.8 Height (in): 60 Pulse (bpm): 65 Weight (lbs): 123 Respiratory Rate (breaths/min): 16 Body Mass Index (BMI): 24 Blood Pressure (mmHg): 145/50 Reference Range: 80 - 120 mg / dl Electronic Signature(s) Signed: 12/14/2017 5:50:18 PM By: Elliot Gurney, BSN, RN, CWS, Kim RN, BSN Entered By: Elliot Gurney, BSN, RN, CWS, Kim on 12/13/2017 10:18:14
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
## 2017-12-19 ENCOUNTER — Encounter: Payer: Medicare Other | Admitting: Physical Therapy

## 2017-12-20 ENCOUNTER — Encounter: Payer: Medicare Other | Admitting: Internal Medicine

## 2017-12-20 DIAGNOSIS — L97812 Non-pressure chronic ulcer of other part of right lower leg with fat layer exposed: Secondary | ICD-10-CM | POA: Diagnosis not present
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
## 2017-12-22 ENCOUNTER — Ambulatory Visit: Payer: Medicare Other | Admitting: Physical Therapy

## 2017-12-23 NOTE — Progress Notes (Signed)
RHETTA, CLEEK ([id number]) Visit Report for 12/20/2017 Arrival Information Details Patient Name: [name], [name] Date of Service: 12/20/2017 2:15 PM Medical Record Number: [id number] Patient Account Number: [id number] Date of Birth/Sex: 1930/04/14 (84 y.o. Female) Treating RN: Ashok Cordia, Debi Primary Care Lateef Juncaj: Aram Beecham Other Clinician: Referring Dwanda Tufano: Aram Beecham Treating Khylie Larmore/Extender: Altamese [place] in Treatment: 5 Visit Information History Since Last Visit All ordered tests and consults were completed: No Patient Arrived: Gilmer Mor Added or deleted any medications: No Arrival Time: 14:33 Any new allergies or adverse reactions: No Accompanied By: self Had a fall or experienced change in No Transfer Assistance: EasyPivot Patient activities of daily living that may affect Lift risk of falls: Patient Identification Verified: Yes Signs or symptoms of abuse/neglect since last visito No Secondary Verification Process Yes Hospitalized since last visit: No Completed: Has Dressing in Place as Prescribed: Yes Patient Requires Transmission-Based No Precautions: Pain Present Now: No Patient Has Alerts: No Electronic Signature(s) Signed: 12/20/2017 4:47:31 PM By: Alejandro Mulling Entered By: Alejandro Mulling on 12/20/2017 14:33:46 [name] ([id number]) -------------------------------------------------------------------------------- Clinic Level of Care Assessment Details Patient Name: [name] Date of Service: 12/20/2017 2:15 PM Medical Record Number: [id number] Patient Account Number: [id number] Date of Birth/Sex: 10-28-1930 (84 y.o. Female) Treating RN: Ashok Cordia, Debi Primary Care Raneshia Derick: Aram Beecham Other Clinician: Referring Shantoya Geurts: Aram Beecham Treating Sabriya Yono/Extender: Altamese [place] in Treatment: 5 Clinic Level of Care Assessment Items TOOL 4 Quantity Score X - Use when only an EandM is performed on FOLLOW-UP  visit 1 0 ASSESSMENTS - Nursing Assessment / Reassessment X - Reassessment of Co-morbidities (includes updates in patient status) 1 10 X- 1 5 Reassessment of Adherence to Treatment Plan ASSESSMENTS - Wound and Skin Assessment / Reassessment X - Simple Wound Assessment / Reassessment - one wound 1 5 []  - 0 Complex Wound Assessment / Reassessment - multiple wounds []  - 0 Dermatologic / Skin Assessment (not related to wound area) ASSESSMENTS - Focused Assessment []  - Circumferential Edema Measurements - multi extremities 0 []  - 0 Nutritional Assessment / Counseling / Intervention []  - 0 Lower Extremity Assessment (monofilament, tuning fork, pulses) []  - 0 Peripheral Arterial Disease Assessment (using hand held doppler) ASSESSMENTS - Ostomy and/or Continence Assessment and Care []  - Incontinence Assessment and Management 0 []  - 0 Ostomy Care Assessment and Management (repouching, etc.) PROCESS - Coordination of Care X - Simple Patient / Family Education for ongoing care 1 15 []  - 0 Complex (extensive) Patient / Family Education for ongoing care []  - 0 Staff obtains , Records, Test Results / Process Orders []  - 0 Staff telephones HHA, Nursing Homes / Clarify orders / etc []  - 0 Routine Transfer to another Facility (non-emergent condition) []  - 0 Routine Hospital Admission (non-emergent condition) []  - 0 New Admissions / / Ordering NPWT, Apligraf, etc. []  - 0 Emergency Hospital Admission (emergent condition) X- 1 10 Simple Discharge Coordination [name], [name] ( ) []  - 0 Complex (extensive) Discharge Coordination PROCESS - Special Needs []  - Pediatric / Minor Patient Management 0 []  - 0 Isolation Patient Management []  - 0 Hearing / Language / Visual special needs []  - 0 Assessment of Community assistance (transportation, D/C planning, etc.) []  - 0 Additional assistance / Altered mentation []  - 0 Support Surface(s) Assessment  (bed, cushion, seat, etc.) INTERVENTIONS - Wound Cleansing / Measurement X - Simple Wound Cleansing - one wound 1 5 []  - 0 Complex Wound Cleansing - multiple wounds X- 1 5 Wound  Imaging (photographs - any number of wounds) []  - 0 Wound Tracing (instead of photographs) X- 1 5 Simple Wound Measurement - one wound []  - 0 Complex Wound Measurement - multiple wounds INTERVENTIONS - Wound Dressings X - Small Wound Dressing one or multiple wounds 1 10 []  - 0 Medium Wound Dressing one or multiple wounds []  - 0 Large Wound Dressing one or multiple wounds X- 1 5 Application of Medications - topical []  - 0 Application of Medications - injection INTERVENTIONS - Miscellaneous []  - External ear exam 0 []  - 0 Specimen Collection (cultures, biopsies, blood, body fluids, etc.) []  - 0 Specimen(s) / Culture(s) sent or taken to Lab for analysis []  - 0 Patient Transfer (multiple staff / / Similar devices) []  - 0 Simple Staple / Suture removal (25 or less) []  - 0 Complex Staple / Suture removal (26 or more) []  - 0 Hypo / Hyperglycemic Management (close monitor of Blood Glucose) []  - 0 Ankle / Brachial Index (ABI) - do not check if billed separately X- 1 5 Vital Signs [name], [name] ( ) Has the patient been seen at the hospital within the last three years: Yes Total Score: 80 Level Of Care: New/Established - Level 3 Electronic Signature(s) Signed: 12/20/2017 4:47:31 PM By: Entered By: on 12/20/2017 15:48:52 ( ) -------------------------------------------------------------------------------- Encounter Discharge Information Details Patient Name: Nurse, adult Date of Service: 12/20/2017 2:15 PM Medical Record Number: Patient Account Number: Date of Birth/Sex: 1930-04-23 (84 y.o. Female) Treating RN: [id number], Debi Primary Care Takeem Krotzer: 02/17/2018 Other Clinician: Referring  Librada Castronovo: Alejandro Mulling Treating Arbor Cohen/Extender: Alejandro Mulling in Treatment: 5 Encounter Discharge Information Items Discharge Pain Level: 0 Discharge Condition: Stable Ambulatory Status: Cane Discharge Destination: Home Transportation: Private Auto Accompanied By: self Schedule Follow-up Appointment: Yes Medication Reconciliation completed and No provided to Patient/Care Jadwiga Faidley: Provided on Clinical Summary of Care: 12/20/2017 Form Type Recipient Paper Patient [facility] Electronic Signature(s) Signed: 12/22/2017 10:03:49 AM By: [name] Previous Signature: 12/20/2017 2:58:22 PM Version By: [id number] Entered By: [id number] on 12/20/2017 15:16:28 83 (Ashok Cordia) -------------------------------------------------------------------------------- Lower Extremity Assessment Details Patient Name: Aram Beecham Date of Service: 12/20/2017 2:15 PM Medical Record Number: Altamese [place] Patient Account Number: 02/17/2018 Date of Birth/Sex: 1930/01/29 (84 y.o. Female) Treating RN: Gwenlyn Perking, Debi Primary Care [facility]: 02/17/2018 Other Clinician: Referring Zamir Staples: Alejandro Mulling Treating Lauralee Waters/Extender: Gwenlyn Perking in Treatment: 5 Vascular Assessment Pulses: Dorsalis Pedis Palpable: [Right:Yes] Posterior Tibial Extremity colors, hair growth, and conditions: Extremity Color: [Right:Hyperpigmented] Temperature of Extremity: [Right:Warm] Capillary Refill: [Right:< 3 seconds] Toe Nail Assessment Left: Right: Thick: No Discolored: No Deformed: No Improper Length and Hygiene: No Electronic Signature(s) Signed: 12/20/2017 4:47:31 PM By: [name] Entered By: [id number] on 12/20/2017 14:40:20 02/17/2018 ([id number]) -------------------------------------------------------------------------------- Multi Wound Chart Details Patient Name: [id number] Date of Service: 12/20/2017 2:15 PM Medical Record Number:  [id number] Patient Account Number: Ashok Cordia Date of Birth/Sex: 1930/10/08 (84 y.o. Female) Treating RN: Altamese [place], Debi Primary Care Dayden Viverette: 02/17/2018 Other Clinician: Referring Fahima Cifelli: Alejandro Mulling Treating Nandi Tonnesen/Extender: Alejandro Mulling in Treatment: 5 Vital Signs Height(in): 60 Pulse(bpm): 66 Weight(lbs): 123 Blood Pressure(mmHg): 128/50 Body Mass Index(BMI): 24 Temperature(F): 98.1 Respiratory Rate 16 (breaths/min): Photos: [5:No Photos] [N/A:N/A] Wound Location: [5:Right Lower Leg - Anterior] [N/A:N/A] Wounding Event: [5:Trauma] [N/A:N/A] Primary Etiology: [5:Trauma, Other] [N/A:N/A] Comorbid History: [5:Hypertension, Gout, Osteoarthritis, Neuropathy] [N/A:N/A] Date Acquired: [5:09/05/2017] [N/A:N/A] Weeks of Treatment: [5:5] [N/A:N/A] Wound Status: [  5:Open] [N/A:N/A] Measurements L x W x D [5:1x0.9x0.2] [N/A:N/A] (cm) Area (cm) : [5:0.707] [N/A:N/A] Volume (cm) : [5:0.141] [N/A:N/A] % Reduction in Area: [5:46.40%] [N/A:N/A] % Reduction in Volume: [5:-6.80%] [N/A:N/A] Classification: [5:Full Thickness With Exposed Support Structures] [N/A:N/A] Exudate Amount: [5:Large] [N/A:N/A] Exudate Type: [5:Serous] [N/A:N/A] Exudate Color: [5:amber] [N/A:N/A] Wound Margin: [5:Distinct, outline attached] [N/A:N/A] Granulation Amount: [5:Small (1-33%)] [N/A:N/A] Granulation Quality: [5:Red] [N/A:N/A] Necrotic Amount: [5:Large (67-100%)] [N/A:N/A] Exposed Structures: [5:Fat Layer (Subcutaneous Tissue) Exposed: Yes Fascia: No Tendon: No Muscle: No Joint: No Bone: No] [N/A:N/A] Epithelialization: [5:None] [N/A:N/A] Periwound Skin Texture: [5:No Abnormalities Noted] [N/A:N/A] Periwound Skin Moisture: [5:No Abnormalities Noted] [N/A:N/A] Periwound Skin Color: [5:Erythema: Yes] [N/A:N/A] Erythema Location: [5:Circumferential] [N/A:N/A] Temperature: [5:No Abnormality] [N/A:N/A] Tenderness on Palpation: Yes N/A N/A Wound Preparation: Ulcer Cleansing: N/A  N/A Rinsed/Irrigated with Saline Topical Anesthetic Applied: Other: lidocaine 4% Treatment Notes Wound #5 (Right, Anterior Lower Leg) 1. Cleansed with: Clean wound with Normal Saline 2. Anesthetic Topical Lidocaine 4% cream to wound bed prior to debridement 3. Peri-wound Care: Skin Prep 4. Dressing Applied: Iodoflex 5. Secondary Dressing Applied Bordered Foam Dressing Electronic Signature(s) Signed: 12/20/2017 4:32:47 PM By: Baltazar Najjar MD Entered By: Baltazar Najjar on 12/20/2017 15:35:07 [name] ([id number]) -------------------------------------------------------------------------------- Multi-Disciplinary Care Plan Details Patient Name: [name], [name] Date of Service: 12/20/2017 2:15 PM Medical Record Number: [id number] Patient Account Number: [id number] Date of Birth/Sex: 02/08/1930 (84 y.o. Female) Treating RN: Ashok Cordia, Debi Primary Care Diane Mochizuki: Aram Beecham Other Clinician: Referring Hunt Zajicek: Aram Beecham Treating Miana Politte/Extender: Altamese [place] in Treatment: 5 Active Inactive ` Orientation to the Wound Care Program Nursing Diagnoses: Knowledge deficit related to the wound healing center program Goals: Patient/caregiver will verbalize understanding of the Wound Healing Center Program Date Initiated: 11/14/2017 Target Resolution Date: 11/28/2017 Goal Status: Active Interventions: Provide education on orientation to the wound center Notes: ` Wound/Skin Impairment Nursing Diagnoses: Impaired tissue integrity Knowledge deficit related to ulceration/compromised skin integrity Goals: Patient/caregiver will verbalize understanding of skin care regimen Date Initiated: 11/14/2017 Target Resolution Date: 11/28/2017 Goal Status: Active Ulcer/skin breakdown will have a volume reduction of 30% by week 4 Date Initiated: 11/14/2017 Target Resolution Date: 11/28/2017 Goal Status: Active Interventions: Assess patient/caregiver ability to  obtain necessary supplies Assess patient/caregiver ability to perform ulcer/skin care regimen upon admission and as needed Assess ulceration(s) every visit Treatment Activities: Skin care regimen initiated : 11/14/2017 Notes: Electronic Signature(s) Signed: 12/20/2017 4:47:31 PM By: Vira Agar, Lawernce Keas ([id number]) Entered By: Alejandro Mulling on 12/20/2017 14:40:24 [name] ([id number]) -------------------------------------------------------------------------------- Pain Assessment Details Patient Name: [name] Date of Service: 12/20/2017 2:15 PM Medical Record Number: [id number] Patient Account Number: [id number] Date of Birth/Sex: 09-26-1930 (84 y.o. Female) Treating RN: Ashok Cordia, Debi Primary Care Marisal Swarey: Aram Beecham Other Clinician: Referring Brexlee Heberlein: Aram Beecham Treating Bryona Foxworthy/Extender: Altamese [place] in Treatment: 5 Active Problems Location of Pain Severity and Description of Pain Patient Has Paino No Site Locations Pain Management and Medication Current Pain Management: Electronic Signature(s) Signed: 12/20/2017 4:47:31 PM By: Alejandro Mulling Entered By: Alejandro Mulling on 12/20/2017 14:33:52 [name] ([id number]) -------------------------------------------------------------------------------- Patient/Caregiver Education Details Patient Name: [name] Date of Service: 12/20/2017 2:15 PM Medical Record Number: [id number] Patient Account Number: [id number] Date of Birth/Gender: 04/11/1930 (84 y.o. Female) Treating RN: Phillis Haggis Primary Care Physician: Aram Beecham Other Clinician: Referring Physician: Aram Beecham Treating Physician/Extender: Altamese [place] in Treatment: 5 Education Assessment Education Provided To: Patient Education Topics Provided Wound/Skin Impairment: Handouts: Caring for Your Ulcer, Other: change  dressing as ordered Methods: Demonstration,  Explain/Verbal Responses: State content correctly Electronic Signature(s) Signed: 12/20/2017 4:47:31 PM By: Alejandro Mulling Entered By: Alejandro Mulling on 12/20/2017 14:58:43 [name] ([id number]) -------------------------------------------------------------------------------- Wound Assessment Details Patient Name: [name] Date of Service: 12/20/2017 2:15 PM Medical Record Number: [id number] Patient Account Number: [id number] Date of Birth/Sex: Jul 05, 1930 (84 y.o. Female) Treating RN: Ashok Cordia, Debi Primary Care Merl Bommarito: Aram Beecham Other Clinician: Referring Cledis Sohn: Aram Beecham Treating Ivett Luebbe/Extender: Altamese [place] in Treatment: 5 Wound Status Wound Number: 5 Primary Etiology: Trauma, Other Wound Location: Right Lower Leg - Anterior Wound Status: Open Wounding Event: Trauma Comorbid Hypertension, Gout, Osteoarthritis, History: Neuropathy Date Acquired: 09/05/2017 Weeks Of Treatment: 5 Clustered Wound: No Photos Photo Uploaded By: Alejandro Mulling on 12/20/2017 16:27:19 Wound Measurements Length: (cm) 1 Width: (cm) 0.9 Depth: (cm) 0.2 Area: (cm) 0.707 Volume: (cm) 0.141 % Reduction in Area: 46.4% % Reduction in Volume: -6.8% Epithelialization: None Tunneling: No Wound Description Full Thickness With Exposed Support Foul Classification: Structures Slou Wound Margin: Distinct, outline attached Exudate Large Amount: Exudate Type: Serous Exudate Color: amber Odor After Cleansing: No gh/Fibrino Yes Wound Bed Granulation Amount: Small (1-33%) Exposed Structure Granulation Quality: Red Fascia Exposed: No Necrotic Amount: Large (67-100%) Fat Layer (Subcutaneous Tissue) Exposed: Yes Necrotic Quality: Adherent Slough Tendon Exposed: No Muscle Exposed: No Joint Exposed: No Bone Exposed: No [name], [name] ([id number]) Periwound Skin Texture Texture Color No Abnormalities Noted: No No Abnormalities Noted: No Erythema:  Yes Moisture Erythema Location: Circumferential No Abnormalities Noted: No Temperature / Pain Temperature: No Abnormality Tenderness on Palpation: Yes Wound Preparation Ulcer Cleansing: Rinsed/Irrigated with Saline Topical Anesthetic Applied: Other: lidocaine 4%, Treatment Notes Wound #5 (Right, Anterior Lower Leg) 1. Cleansed with: Clean wound with Normal Saline 2. Anesthetic Topical Lidocaine 4% cream to wound bed prior to debridement 3. Peri-wound Care: Skin Prep 4. Dressing Applied: Iodoflex 5. Secondary Dressing Applied Bordered Foam Dressing Electronic Signature(s) Signed: 12/20/2017 4:47:31 PM By: Alejandro Mulling Entered By: Alejandro Mulling on 12/20/2017 14:41:31 [name] ([id number]) -------------------------------------------------------------------------------- Vitals Details Patient Name: [name] Date of Service: 12/20/2017 2:15 PM Medical Record Number: [id number] Patient Account Number: [id number] Date of Birth/Sex: 1930/07/08 (84 y.o. Female) Treating RN: Ashok Cordia, Debi Primary Care Mateus Rewerts: Aram Beecham Other Clinician: Referring Jamieson Hetland: Aram Beecham Treating Paxtyn Boyar/Extender: Altamese [place] in Treatment: 5 Vital Signs Time Taken: 14:33 Temperature (F): 98.1 Height (in): 60 Pulse (bpm): 66 Weight (lbs): 123 Respiratory Rate (breaths/min): 16 Body Mass Index (BMI): 24 Blood Pressure (mmHg): 128/50 Reference Range: 80 - 120 mg / dl Electronic Signature(s) Signed: 12/20/2017 4:47:31 PM By: Alejandro Mulling Entered By: Alejandro Mulling on 12/20/2017 14:36:57

## 2017-12-26 ENCOUNTER — Ambulatory Visit: Payer: Medicare Other

## 2017-12-26 DIAGNOSIS — R2681 Unsteadiness on feet: Secondary | ICD-10-CM | POA: Diagnosis not present

## 2017-12-26 DIAGNOSIS — M6281 Muscle weakness (generalized): Secondary | ICD-10-CM

## 2017-12-26 NOTE — Therapy (Signed)
[facility] PH[facility] MEDICINE [address] [place], [address] Phone: [phone]   Fax:  [phone]  Physical Therapy Treatment  Patient Details  Name: [name] MRN: [id number] Date of Birth: 28-Dec-1929 Referring Provider: Aram Beecham, MD   Encounter Date: 12/26/2017  PT End of Session - 12/26/17 1301    Visit Number  7    Number of Visits  13    Date for PT Re-Evaluation  01/09/18    PT Start Time  1302    PT Stop Time  1348    PT Time Calculation (min)  46 min    Equipment Utilized During Treatment  Gait belt    Activity Tolerance  Patient tolerated treatment well    Behavior During Therapy  [facility] for tasks assessed/performed       Past Medical History:  Diagnosis Date  . Anemia   . GERD (gastroesophageal reflux disease)   . Hypertension   . Syncope     History reviewed. No pertinent surgical history.  There were no vitals filed for this visit.  Subjective Assessment - 12/26/17 1300    Subjective  Pt reports she is doing well on this date but she continues to feel "wobbly" on her feet. She denies pain at this time. She would like more information about a gym membership for community-based exercise program.     Pertinent History  Pt is well known to this clinic, just recently d/c from this clinic on 12/6 after being seen for R shoulder pain.  Pt reports that her balance is not good and finds herself unsteady at times.  Pt has had EMGs performed BUEs and BLEs which were both negative.  Pt reports she does have numbness in L foot up to ankle at all times.  Pt reports that her balance is worse when standing still, improves when she is moving. Goals: would like to be able to walk through the grass to check on her garden. Pt with h/o ACDF C4-6 on 04/19/17. Pt has a h/o 3 surgeries in her lumbar spine.     Limitations  Standing;Walking    How long can you sit comfortably?  1 hr    How long can you stand comfortably?   10 minutes    How long can you walk comfortably?  2 or 3 blocks    Diagnostic tests  EMG testing BLEs and BUEs which were negative    Patient Stated Goals  To improve her balance, to be able to walk her dog    Currently in Pain?  No/denies          TREATMENT  Ther-ex  NuStep L1 x 3 minutes, L2 x 3 minutes for warm-up during history: OMEGA leg press 65# x 15, 75# x 15; Attempted Matrix hip abduction but pt unable to perform due to increase in pain where pad hits leg; Standing hip abduction with red theraband for resistance 2 x 10; Standing hip extension with red theraband for resistance x 10; Forward step ups to 1 riser without UE support alternating LE x 10 each, pt struggles not to reach out for support during exercise; Side step ups to 1 riser without UE support alternating LE x 10 each, pt struggles not to reach out for support during exercise;  Neuromuscular Re-education  Toe taps to 6" step without UE support, constant CGA/minA with pt intermittently reaching out to touch support; Airex balance with feet apart and eyes closed x 30s,  multiple bouts; Airex balance with feet together and horizontal/vertical head turns x multiple bouts each; Airex balance beam side stepping 5" x 6 laps;                      PT Education - 12/26/17 1301    Education provided  Yes    Education Details  exercise form/technique    Person(s) Educated  Patient    Methods  Explanation;Demonstration    Comprehension  Verbalized understanding;Returned demonstration       PT Short Term Goals - 12/11/17 1604      PT SHORT TERM GOAL #1   Title  Pt will be independent with HEP for carryover between sessions    Time  2    Period  Weeks    Status  New        PT Long Term Goals - 12/11/17 1604      PT LONG TERM GOAL #1   Title  Pt will improve Berg score to at least 45/56 to demonstrate improved balance    Baseline  35/56    Time  5    Period  Weeks    Status  New       PT LONG TERM GOAL #2   Title  Pt will improve 5xSTS time to at least 15 seconds to demonstrate improved BLE strength and balance    Baseline  25:30    Time  4    Period  Weeks    Status  New      PT LONG TERM GOAL #3   Title  Pt will demonstrate BLE strength at least 4/5 throughout for improved functional mobility    Baseline  See Evaluation    Time  6    Period  Weeks    Status  New      PT LONG TERM GOAL #4   Title  Pt will be able to walk through her grass to check on her garden without LOB for improved QOL    Baseline  Unable     Time  4    Period  Weeks    Status  New      PT LONG TERM GOAL #5   Title  Pt will improve to at least 1.0 m/s for pt to transition to community ambulator    Baseline  0.58 m/s    Time  4    Period  Weeks    Status  New      PT LONG TERM GOAL #6   Title  Pt will improve ABC scale to at least 30% to demonstrate decreased effect of impaired balance on daily activities    Baseline  51%    Time  4    Period  Weeks    Status  New            Plan - 12/26/17 1301    Clinical Impression Statement  Patient encouraged to discuss community based exercise programs and gyms with full time replacement therapist. She demonstrates significant instability today with balance exercises and struggles not to reach for support with challenge. She also demonstrates signficant LE weakness with strength training. Pt encouraged to continue HEP and follow-up as scheduled.     Rehab Potential  Good    PT Frequency  2x / week    PT Duration  6 weeks    PT Treatment/Interventions  ADLs/Self Care Home Management;Aquatic Therapy;Biofeedback;Cryotherapy;Electrical Stimulation;Iontophoresis 4mg /ml Dexamethasone;Moist Heat;Ultrasound;Parrafin;Fluidtherapy;Contrast Bath;DME Instruction;Gait training;Stair training;Functional  mobility training;Therapeutic activities;Therapeutic exercise;Balance training;Neuromuscular re-education;Patient/family education;Orthotic  Fit/Training;Manual techniques;Passive range of motion;Dry needling;Energy conservation;Taping    PT Next Visit Plan  progress strengthening exercises and balance exercises    PT Home Exercise Plan  --    Consulted and Agree with Plan of Care  Patient       Patient will benefit from skilled therapeutic intervention in order to improve the following deficits and impairments:  Abnormal gait, Decreased activity tolerance, Decreased balance, Decreased endurance, Decreased knowledge of use of DME, Decreased mobility, Decreased range of motion, Decreased safety awareness, Decreased strength, Difficulty walking, Impaired perceived functional ability, Impaired flexibility, Impaired sensation, Impaired UE functional use, Improper body mechanics, Postural dysfunction  Visit Diagnosis: Unsteadiness on feet  Muscle weakness (generalized)     Problem List Patient Active Problem List   Diagnosis Date Noted  . Lymphedema 10/11/2017  . Chronic venous insufficiency 10/11/2017  . Leg pain 10/11/2017  . Leg swelling 10/11/2017  . COPD (chronic obstructive pulmonary disease) (HCC) 10/11/2017  . Essential hypertension 10/11/2017   Lynnea Maizes PT, DPT   Delorean Knutzen 12/26/2017, 3:42 PM  [facility] PHYS[facility] AND SPORTS MEDICINE [address] [place], [address] Phone: [phone]   Fax:  [phone]  Name: [name] MRN: [id number] Date of Birth: 1930-04-10

## 2017-12-27 ENCOUNTER — Encounter: Payer: Medicare Other | Admitting: Internal Medicine

## 2017-12-27 DIAGNOSIS — L97812 Non-pressure chronic ulcer of other part of right lower leg with fat layer exposed: Secondary | ICD-10-CM | POA: Diagnosis not present

## 2017-12-28 ENCOUNTER — Ambulatory Visit: Payer: Medicare Other | Admitting: Physical Therapy

## 2017-12-28 DIAGNOSIS — R2681 Unsteadiness on feet: Secondary | ICD-10-CM

## 2017-12-28 DIAGNOSIS — M6281 Muscle weakness (generalized): Secondary | ICD-10-CM

## 2017-12-28 DIAGNOSIS — R2689 Other abnormalities of gait and mobility: Secondary | ICD-10-CM

## 2017-12-28 NOTE — Therapy (Signed)
[facility] [address] [place], [address] Phone: [phone]   Fax:  [phone]  Physical Therapy Treatment  Patient Details  Name: [name] MRN: [id number] Date of Birth: 09-12-1930 Referring Provider: Aram Beecham, MD   Encounter Date: 12/28/2017  PT End of Session - 12/28/17 1540    Visit Number  8    Number of Visits  13    Date for PT Re-Evaluation  01/09/18    PT Start Time  1115    PT Stop Time  1200    PT Time Calculation (min)  45 min    Equipment Utilized During Treatment  Gait belt    Activity Tolerance  Patient tolerated treatment well    Behavior During Therapy  [facility] for tasks assessed/performed       Past Medical History:  Diagnosis Date  . Anemia   . GERD (gastroesophageal reflux disease)   . Hypertension   . Syncope     No past surgical history on file.  There were no vitals filed for this visit.  Subjective Assessment - 12/28/17 1437    Subjective  Pt reports she continues to feel unsteady on her feet.  Pt reports compliance with her HEP.    Pertinent History  Pt is well known to this clinic, just recently d/c from this clinic on 12/6 after being seen for R shoulder pain.  Pt reports that her balance is not good and finds herself unsteady at times.  Pt has had EMGs performed BUEs and BLEs which were both negative.  Pt reports she does have numbness in L foot up to ankle at all times.  Pt reports that her balance is worse when standing still, improves when she is moving. Goals: would like to be able to walk through the grass to check on her garden. Pt with h/o ACDF C4-6 on 04/19/17. Pt has a h/o 3 surgeries in her lumbar spine.     Limitations  Standing;Walking    How long can you sit comfortably?  1 hr    How long can you stand comfortably?  10 minutes    How long can you walk comfortably?  2 or 3 blocks    Diagnostic tests  EMG testing BLEs and BUEs which were negative     Patient Stated Goals  To improve her balance, to be able to walk her dog    Currently in Pain?  No/denies    Pain Onset  More than a month ago    Multiple Pain Sites  No            PT Treatment:  Nustep L5 for 6 min warm up; f/b Omega Leg Press 65# 20x, then 75# 20x; forward and lateral step ups on 6" step with hand on rail 2x10 each; standing hip abd and extension with RTB 2x10 each B; EO/EC with narrow BOS on AirEx; tandem stance and tandem gait on AirEx balance beam; SLS on each leg on AirEx pad.  Attempted sit to stands but pt unable to perform them at end of session without use of hands due to LE fatigue.                  PT Education - 12/28/17 1540    Education provided  Yes    Education Details  exercise form/technique    Person(s) Educated  Patient    Methods  Explanation;Demonstration    Comprehension  Verbalized understanding;Returned  demonstration       PT Short Term Goals - 12/28/17 1546      PT SHORT TERM GOAL #1   Title  Pt will be independent with HEP for carryover between sessions    Time  2    Period  Weeks    Status  New        PT Long Term Goals - 12/28/17 1546      PT LONG TERM GOAL #1   Title  Pt will improve Berg score to at least 45/56 to demonstrate improved balance    Baseline  35/56    Time  5    Period  Weeks    Status  New      PT LONG TERM GOAL #2   Title  Pt will improve 5xSTS time to at least 15 seconds to demonstrate improved BLE strength and balance    Baseline  25:30    Time  4    Period  Weeks    Status  New      PT LONG TERM GOAL #3   Title  Pt will demonstrate BLE strength at least 4/5 throughout for improved functional mobility    Baseline  See Evaluation    Time  6    Period  Weeks    Status  New      PT LONG TERM GOAL #4   Title  Pt will be able to walk through her grass to check on her garden without LOB for improved QOL    Baseline  Unable     Time  4    Period  Weeks    Status  New      PT LONG  TERM GOAL #5   Title  Pt will improve to at least 1.0 m/s for pt to transition to community ambulator    Baseline  0.58 m/s    Time  4    Period  Weeks    Status  New      PT LONG TERM GOAL #6   Title  Pt will improve ABC scale to at least 30% to demonstrate decreased effect of impaired balance on daily activities    Baseline  51%    Time  4    Period  Weeks    Status  New            Plan - 12/28/17 1541    Clinical Impression Statement  Pt continues to exhibit poor dynamic standing balance and twice had LOB in clinic but caught herself.  She is hesitant to not use hands during NMR activities despite cueing.    Rehab Potential  Good    PT Frequency  2x / week    PT Duration  6 weeks    PT Treatment/Interventions  ADLs/Self Care Home Management;Aquatic Therapy;Biofeedback;Cryotherapy;Electrical Stimulation;Iontophoresis 4mg /ml Dexamethasone;Moist Heat;Ultrasound;Parrafin;Fluidtherapy;Contrast Bath;DME Instruction;Gait training;Stair training;Functional mobility training;Therapeutic activities;Therapeutic exercise;Balance training;Neuromuscular re-education;Patient/family education;Orthotic Fit/Training;Manual techniques;Passive range of motion;Dry needling;Energy conservation;Taping    PT Next Visit Plan  progress strengthening exercises and balance exercises    PT Home Exercise Plan  Sit<>stand    Consulted and Agree with Plan of Care  Patient       Patient will benefit from skilled therapeutic intervention in order to improve the following deficits and impairments:  Abnormal gait, Decreased activity tolerance, Decreased balance, Decreased endurance, Decreased knowledge of use of DME, Decreased mobility, Decreased range of motion, Decreased safety awareness, Decreased strength, Difficulty walking, Impaired perceived functional ability, Impaired flexibility,  Impaired sensation, Impaired UE functional use, Improper body mechanics, Postural dysfunction  Visit  Diagnosis: Unsteadiness on feet  Muscle weakness (generalized)  Other abnormalities of gait and mobility     Problem List Patient Active Problem List   Diagnosis Date Noted  . Lymphedema 10/11/2017  . Chronic venous insufficiency 10/11/2017  . Leg pain 10/11/2017  . Leg swelling 10/11/2017  . COPD (chronic obstructive pulmonary disease) (HCC) 10/11/2017  . Essential hypertension 10/11/2017    Jamice Carreno, MPT 12/28/2017, 3:48 PM  [facility] [address] [place], [address] Phone: [phone]   Fax:  [phone]  Name: NIOMI VALENT MRN: [id number] Date of Birth: [birth year]
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
## 2017-12-28 NOTE — Progress Notes (Signed)
Natasha, Barnett ([id number]) Visit Report for 12/27/2017 Arrival Information Details Patient Name: [name], [name] Date of Service: 12/27/2017 2:45 PM Medical Record Number: [id number] Patient Account Number: [id number] Date of Birth/Sex: [birth year] (84 y.o. Female) Treating RN: Renne Crigler Primary Care Farrell Broerman: Aram Beecham Other Clinician: Referring Meta Kroenke: Aram Beecham Treating Adiel Erney/Extender: Altamese [place] in Treatment: 6 Visit Information History Since Last Visit All ordered tests and consults were completed: No Patient Arrived: Gilmer Mor Added or deleted any medications: No Arrival Time: 15:02 Any new allergies or adverse reactions: No Accompanied By: self Had a fall or experienced change in No Transfer Assistance: None activities of daily living that may affect Patient Identification Verified: Yes risk of falls: Secondary Verification Process Completed: Yes Signs or symptoms of abuse/neglect since last visito No Patient Requires Transmission-Based Precautions: No Hospitalized since last visit: No Patient Has Alerts: No Pain Present Now: No Electronic Signature(s) Signed: 12/27/2017 4:51:06 PM By: Renne Crigler Entered By: Renne Crigler on 12/27/2017 15:02:30 [name] ([id number]) -------------------------------------------------------------------------------- Encounter Discharge Information Details Patient Name: [name] Date of Service: 12/27/2017 2:45 PM Medical Record Number: [id number] Patient Account Number: [id number] Date of Birth/Sex: 05-26-1930 (84 y.o. Female) Treating RN: Renne Crigler Primary Care Akirah Storck: Aram Beecham Other Clinician: Referring Adedamola Seto: Aram Beecham Treating Greenley Martone/Extender: Altamese [place] in Treatment: 6 Encounter Discharge Information Items Discharge Pain Level: 0 Discharge Condition: Stable Ambulatory Status: Cane Discharge Destination:  Home Private Transportation: Auto Accompanied By: self Schedule Follow-up Appointment: Yes Medication Reconciliation completed and provided No to Patient/Care Kalaya Infantino: Clinical Summary of Care: Electronic Signature(s) Signed: 12/27/2017 4:51:06 PM By: Renne Crigler Entered By: Renne Crigler on 12/27/2017 15:36:54 [name] ([id number]) -------------------------------------------------------------------------------- Lower Extremity Assessment Details Patient Name: [name] Date of Service: 12/27/2017 2:45 PM Medical Record Number: [id number] Patient Account Number: [id number] Date of Birth/Sex: 24-Sep-1930 (84 y.o. Female) Treating RN: Renne Crigler Primary Care Shadeed Colberg: Aram Beecham Other Clinician: Referring Nicasio Barlowe: Aram Beecham Treating Tomasina Keasling/Extender: Altamese [place] in Treatment: 6 Vascular Assessment Claudication: Claudication Assessment [Right:None] Pulses: Dorsalis Pedis Palpable: [Right:Yes] Posterior Tibial Extremity colors, hair growth, and conditions: Extremity Color: [Right:Hyperpigmented] Hair Growth on Extremity: [Right:No] Temperature of Extremity: [Right:Warm] Capillary Refill: [Right:< 3 seconds] Toe Nail Assessment Left: Right: Thick: No Discolored: No Deformed: No Improper Length and Hygiene: No Electronic Signature(s) Signed: 12/27/2017 4:51:06 PM By: Renne Crigler Entered By: Renne Crigler on 12/27/2017 15:10:19 [name] ([id number]) -------------------------------------------------------------------------------- Multi Wound Chart Details Patient Name: [name] Date of Service: 12/27/2017 2:45 PM Medical Record Number: [id number] Patient Account Number: [id number] Date of Birth/Sex: 09-06-1930 (84 y.o. Female) Treating RN: Renne Crigler Primary Care Kenry Daubert: Aram Beecham Other Clinician: Referring Haddie Bruhl: Aram Beecham Treating Zayda Angell/Extender: Altamese [place] in  Treatment: 6 Vital Signs Height(in): 60 Pulse(bpm): 66 Weight(lbs): 123 Blood Pressure(mmHg): 124/57 Body Mass Index(BMI): 24 Temperature(F): 97.8 Respiratory Rate 16 (breaths/min): Photos: [5:No Photos] [N/A:N/A] Wound Location: [5:Right Lower Leg - Anterior] [N/A:N/A] Wounding Event: [5:Trauma] [N/A:N/A] Primary Etiology: [5:Trauma, Other] [N/A:N/A] Comorbid History: [5:Hypertension, Gout, Osteoarthritis, Neuropathy] [N/A:N/A] Date Acquired: [5:09/05/2017] [N/A:N/A] Weeks of Treatment: [5:6] [N/A:N/A] Wound Status: [5:Open] [N/A:N/A] Measurements L x W x D [5:1.2x1x0.2] [N/A:N/A] (cm) Area (cm) : [5:0.942] [N/A:N/A] Volume (cm) : [5:0.188] [N/A:N/A] % Reduction in Area: [5:28.60%] [N/A:N/A] % Reduction in Volume: [5:-42.40%] [N/A:N/A] Classification: [5:Full Thickness With Exposed Support Structures] [N/A:N/A] Exudate Amount: [5:Medium] [N/A:N/A] Exudate Type: [5:Serous] [N/A:N/A] Exudate Color: [5:amber] [N/A:N/A] Wound Margin: [5:Distinct, outline attached] [N/A:N/A] Granulation Amount: [5:Small (1-33%)] [N/A:N/A] Granulation Quality: [5:Red] [  N/A:N/A] Necrotic Amount: [5:Large (67-100%)] [N/A:N/A] Exposed Structures: [5:Fat Layer (Subcutaneous Tissue) Exposed: Yes Fascia: No Tendon: No Muscle: No Joint: No Bone: No] [N/A:N/A] Epithelialization: [5:None] [N/A:N/A] Debridement: [5:Debridement (11042-11047)] [N/A:N/A] Pre-procedure [5:03:27] [N/A:N/A] Verification/Time Out Taken: Pain Control: [5:Other] [N/A:N/A] Tissue Debrided: [N/A:N/A] Fibrin/Slough, Skin, Subcutaneous Level: Skin/Subcutaneous Tissue N/A N/A Debridement Area (sq cm): 1.2 N/A N/A Instrument: Curette N/A N/A Bleeding: Moderate N/A N/A Hemostasis Achieved: Pressure N/A N/A Procedural Pain: 0 N/A N/A Post Procedural Pain: 0 N/A N/A Debridement Treatment Procedure was tolerated well N/A N/A Response: Post Debridement 1.2x1x0.3 N/A N/A Measurements L x W x D (cm) Post Debridement Volume:  0.283 N/A N/A (cm) Periwound Skin Texture: Induration: Yes N/A N/A Excoriation: No Callus: No Crepitus: No Rash: No Scarring: No Periwound Skin Moisture: Maceration: No N/A N/A Dry/Scaly: No Periwound Skin Color: Atrophie Blanche: No N/A N/A Cyanosis: No Ecchymosis: No Erythema: No Hemosiderin Staining: No Mottled: No Pallor: No Rubor: No Temperature: No Abnormality N/A N/A Tenderness on Palpation: Yes N/A N/A Wound Preparation: Ulcer Cleansing: N/A N/A Rinsed/Irrigated with Saline Topical Anesthetic Applied: Other: lidocaine 4% Procedures Performed: Debridement N/A N/A Treatment Notes Wound #5 (Right, Anterior Lower Leg) 1. Cleansed with: Clean wound with Normal Saline 2. Anesthetic Topical Lidocaine 4% cream to wound bed prior to debridement Hurricaine Topical Anesthetic Spray 4. Dressing Applied: Iodoflex 5. Secondary Dressing Applied Bordered Foam Dressing Electronic Signature(s) Signed: 12/27/2017 4:56:29 PM By: Baltazar Najjar MD [name] ([id number]) Entered By: Baltazar Najjar on 12/27/2017 16:04:23 [name] ([id number]) -------------------------------------------------------------------------------- Multi-Disciplinary Care Plan Details Patient Name: EZRIE, BUNYAN Date of Service: 12/27/2017 2:45 PM Medical Record Number: [id number] Patient Account Number: [id number] Date of Birth/Sex: November 25, 1930 (84 y.o. Female) Treating RN: Renne Crigler Primary Care Cythnia Osmun: Aram Beecham Other Clinician: Referring Kainan Patty: Aram Beecham Treating Shirl Ludington/Extender: Altamese [place] in Treatment: 6 Active Inactive ` Orientation to the Wound Care Program Nursing Diagnoses: Knowledge deficit related to the wound healing center program Goals: Patient/caregiver will verbalize understanding of the Wound Healing Center Program Date Initiated: 11/14/2017 Target Resolution Date: 11/28/2017 Goal Status: Active Interventions: Provide  education on orientation to the wound center Notes: ` Wound/Skin Impairment Nursing Diagnoses: Impaired tissue integrity Knowledge deficit related to ulceration/compromised skin integrity Goals: Patient/caregiver will verbalize understanding of skin care regimen Date Initiated: 11/14/2017 Target Resolution Date: 11/28/2017 Goal Status: Active Ulcer/skin breakdown will have a volume reduction of 30% by week 4 Date Initiated: 11/14/2017 Target Resolution Date: 11/28/2017 Goal Status: Active Interventions: Assess patient/caregiver ability to obtain necessary supplies Assess patient/caregiver ability to perform ulcer/skin care regimen upon admission and as needed Assess ulceration(s) every visit Treatment Activities: Skin care regimen initiated : 11/14/2017 Notes: Electronic Signature(s) Signed: 12/27/2017 4:51:06 PM By: Lenore Cordia, Lawernce Keas ([id number]) Entered By: Renne Crigler on 12/27/2017 15:11:18 [name] ([id number]) -------------------------------------------------------------------------------- Pain Assessment Details Patient Name: [name] Date of Service: 12/27/2017 2:45 PM Medical Record Number: [id number] Patient Account Number: [id number] Date of Birth/Sex: [birth year] (84 y.o. Female) Treating RN: Renne Crigler Primary Care [place] Kielbas[place]: Aram Beecham Other Clinician: Referring Zaleah Ternes: Aram Beecham Treating Bertina Guthridge/Extender: Altamese [place] in Treatment: 6 Active Problems Location of Pain Severity and Description of Pain Patient Has Paino No Site Locations Pain Management and Medication Current Pain Management: Electronic Signature(s) Signed: 12/27/2017 4:51:06 PM By: Renne Crigler Entered By: Renne Crigler on 12/27/2017 15:03:12 [name] ([id number]) -------------------------------------------------------------------------------- Patient/Caregiver Education Details Patient Name: [name] Date of  Service: 12/27/2017 2:45 PM Medical Record Number: [id number] Patient  Account Number: [id number] Date of Birth/Gender: Dec 15, 1929 (84 y.o. Female) Treating RN: Renne Crigler Primary Care Physician: Aram Beecham Other Clinician: Referring Physician: Aram Beecham Treating Physician/Extender: Altamese [place] in Treatment: 6 Education Assessment Education Provided To: Patient Education Topics Provided Wound Debridement: Handouts: Wound Debridement Methods: Explain/Verbal Responses: State content correctly Wound/Skin Impairment: Handouts: Caring for Your Ulcer Methods: Explain/Verbal Responses: State content correctly Electronic Signature(s) Signed: 12/27/2017 4:51:06 PM By: Renne Crigler Entered By: Renne Crigler on 12/27/2017 15:37:11 [name] ([id number]) -------------------------------------------------------------------------------- Wound Assessment Details Patient Name: [name] Date of Service: 12/27/2017 2:45 PM Medical Record Number: [id number] Patient Account Number: [id number] Date of Birth/Sex: 24-Nov-1930 (84 y.o. Female) Treating RN: Renne Crigler Primary Care Paiden Caraveo: Aram Beecham Other Clinician: Referring Canaan Holzer: Aram Beecham Treating Garmon Dehn/Extender: Altamese [place] in Treatment: 6 Wound Status Wound Number: 5 Primary Etiology: Trauma, Other Wound Location: Right Lower Leg - Anterior Wound Status: Open Wounding Event: Trauma Comorbid Hypertension, Gout, Osteoarthritis, History: Neuropathy Date Acquired: 09/05/2017 Weeks Of Treatment: 6 Clustered Wound: No Photos Photo Uploaded By: Renne Crigler on 12/27/2017 16:48:47 Wound Measurements Length: (cm) 1.2 Width: (cm) 1 Depth: (cm) 0.2 Area: (cm) 0.942 Volume: (cm) 0.188 % Reduction in Area: 28.6% % Reduction in Volume: -42.4% Epithelialization: None Tunneling: No Undermining: No Wound Description Full Thickness With Exposed Support  Foul Classification: Structures Slou Wound Margin: Distinct, outline attached Exudate Medium Amount: Exudate Type: Serous Exudate Color: amber Odor After Cleansing: No gh/Fibrino Yes Wound Bed Granulation Amount: Small (1-33%) Exposed Structure Granulation Quality: Red Fascia Exposed: No Necrotic Amount: Large (67-100%) Fat Layer (Subcutaneous Tissue) Exposed: Yes Necrotic Quality: Adherent Slough Tendon Exposed: No Muscle Exposed: No Joint Exposed: No Bone Exposed: No JUANITTA, HELL V. ([id number]) Periwound Skin Texture Texture Color No Abnormalities Noted: No No Abnormalities Noted: No Callus: No Atrophie Blanche: No Crepitus: No Cyanosis: No Excoriation: No Ecchymosis: No Induration: Yes Erythema: No Rash: No Hemosiderin Staining: No Scarring: No Mottled: No Pallor: No Moisture Rubor: No No Abnormalities Noted: No Dry / Scaly: No Temperature / Pain Maceration: No Temperature: No Abnormality Tenderness on Palpation: Yes Wound Preparation Ulcer Cleansing: Rinsed/Irrigated with Saline Topical Anesthetic Applied: Other: lidocaine 4%, Treatment Notes Wound #5 (Right, Anterior Lower Leg) 1. Cleansed with: Clean wound with Normal Saline 2. Anesthetic Topical Lidocaine 4% cream to wound bed prior to debridement Hurricaine Topical Anesthetic Spray 4. Dressing Applied: Iodoflex 5. Secondary Dressing Applied Bordered Foam Dressing Electronic Signature(s) Signed: 12/27/2017 4:51:06 PM By: Renne Crigler Entered By: Renne Crigler on 12/27/2017 15:09:34 [name] ([id number]) -------------------------------------------------------------------------------- Vitals Details Patient Name: [name] Date of Service: 12/27/2017 2:45 PM Medical Record Number: [id number] Patient Account Number: [id number] Date of Birth/Sex: [birth year] (84 y.o. Female) Treating RN: Renne Crigler Primary Care Mateusz Neilan: Aram Beecham Other Clinician: Referring  Zeta Bucy: Aram Beecham Treating Jill Ruppe/Extender: Altamese [place] in Treatment: 6 Vital Signs Time Taken: 03:03 Temperature (F): 97.8 Height (in): 60 Pulse (bpm): 66 Weight (lbs): 123 Respiratory Rate (breaths/min): 16 Body Mass Index (BMI): 24 Blood Pressure (mmHg): 124/57 Reference Range: 80 - 120 mg / dl Electronic Signature(s) Signed: 12/27/2017 4:51:06 PM By: Renne Crigler Entered By: Renne Crigler on 12/27/2017 15:04:11

## 2018-01-02 ENCOUNTER — Encounter: Payer: Medicare Other | Admitting: Physician Assistant

## 2018-01-02 ENCOUNTER — Ambulatory Visit: Payer: Medicare Other | Admitting: Physical Therapy

## 2018-01-02 DIAGNOSIS — M6281 Muscle weakness (generalized): Secondary | ICD-10-CM

## 2018-01-02 DIAGNOSIS — R2689 Other abnormalities of gait and mobility: Secondary | ICD-10-CM

## 2018-01-02 DIAGNOSIS — R2681 Unsteadiness on feet: Secondary | ICD-10-CM

## 2018-01-02 DIAGNOSIS — L97812 Non-pressure chronic ulcer of other part of right lower leg with fat layer exposed: Secondary | ICD-10-CM | POA: Diagnosis not present

## 2018-01-02 NOTE — Therapy (Signed)
[facility] [address] [place], [address] Phone: [phone]   Fax:  [phone]  Physical Therapy Treatment  Patient Details  Name: SHAHD OCCHIPINTI MRN: [id number] Date of Birth: 21-Dec-1929 Referring Provider: Fulton Reek, MD   Encounter Date: 01/02/2018  PT End of Session - 01/02/18 1128    Visit Number  9    Number of Visits  13    Date for PT Re-Evaluation  01/09/18    PT Start Time  1120    PT Stop Time  1205    PT Time Calculation (min)  45 min    Equipment Utilized During Treatment  Gait belt    Activity Tolerance  Patient tolerated treatment well;Patient limited by fatigue    Behavior During Therapy  [facility] for tasks assessed/performed       Past Medical History:  Diagnosis Date  . Anemia   . GERD (gastroesophageal reflux disease)   . Hypertension   . Syncope     No past surgical history on file.  There were no vitals filed for this visit.  Subjective Assessment - 01/02/18 1126    Subjective  Pt reports she is doing well today. She reports some shoulder pain intermitently d/t bilat bursitis. She her recently stopped her CALM, which has decreased her diarrhea, but she is concerned about the return of her BLE cramping.     Pertinent History  Pt is well known to this clinic, just recently d/c from this clinic on 12/6 after being seen for R shoulder pain.  Pt reports that her balance is not good and finds herself unsteady at times.  Pt has had EMGs performed BUEs and BLEs which were both negative.  Pt reports she does have numbness in L foot up to ankle at all times.  Pt reports that her balance is worse when standing still, improves when she is moving. Goals: would like to be able to walk through the grass to check on her garden. Pt with h/o ACDF C4-6 on 04/19/17. Pt has a h/o 3 surgeries in her lumbar spine.     Currently in Pain?  No/denies         Intervention Today:  Therex -4 minute WU  on NuSTEP during history collection (unbilled) -STS from chair+airex: 2x12 (heavy VC to avoid dynamic valgus)  -Seated Marching 2x10, VC for erect upright posture -Standing hip extension, BUE support, 2x10 c 2lb cuff weight -Standing hip extension, BUE support, 2x10 c 2lb cuff weight -standing heel raise: 2x15   Neuromuscular Reeducation -lateral step up and over  -Narrow stance on airex: 5x15sec , on treadmill for // bars as needed for safety -Lateral Step up and over: 4" step, attempted several times with hand held assist and SPC, but falls anxiety is too limiting; attempted with 25lb plate, but pt appears anxious and ultimately refuses attempt, reporting this too difficult to attempt.  -Lateral side-step with [id number], VC to use line to avoid FWD/BKD drifting.      PT Short Term Goals - 12/28/17 1546      PT SHORT TERM GOAL #1   Title  Pt will be independent with HEP for carryover between sessions    Time  2    Period  Weeks    Status  New        PT Long Term Goals - 12/28/17 1546      PT LONG TERM GOAL #1   Title  Pt will  improve Berg score to at least 45/56 to demonstrate improved balance    Baseline  35/56    Time  5    Period  Weeks    Status  New      PT LONG TERM GOAL #2   Title  Pt will improve 5xSTS time to at least 15 seconds to demonstrate improved BLE strength and balance    Baseline  25:30    Time  4    Period  Weeks    Status  New      PT LONG TERM GOAL #3   Title  Pt will demonstrate BLE strength at least 4/5 throughout for improved functional mobility    Baseline  See Evaluation    Time  6    Period  Weeks    Status  New      PT LONG TERM GOAL #4   Title  Pt will be able to walk through her grass to check on her garden without LOB for improved QOL    Baseline  Unable     Time  4    Period  Weeks    Status  New      PT LONG TERM GOAL #5   Title  Pt will improve 82mT to at least 1.0 m/s for pt to transition to community ambulator     Baseline  0.58 m/s    Time  4    Period  Weeks    Status  New      PT LONG TERM GOAL #6   Title  Pt will improve ABC scale to at least 30% to demonstrate decreased effect of impaired balance on daily activities    Baseline  51%    Time  4    Period  Weeks    Status  New            Plan - 01/02/18 1143    Clinical Impression Statement  COntinued with progression of gross BLE strengthening and progression of balanc etraining in static, dynamic, and unstable postures. Pt demonstrating improved funcitonal strength overall. AMB stability remains limited in AMB. Attempted progression of amblatory balance training on unlevel surfaces and multiplanar gait is met with considerable anxiety with patien tmaking it difficult to instill confidence, Program is modified to improve participation and confidence. Pt feels like PT is really helping with her functional mobility.     Rehab Potential  Good    PT Frequency  2x / week    PT Duration  6 weeks    PT Treatment/Interventions  ADLs/Self Care Home Management;Aquatic Therapy;Biofeedback;Cryotherapy;Electrical Stimulation;Iontophoresis 471mml Dexamethasone;Moist Heat;Ultrasound;Parrafin;Fluidtherapy;Contrast Bath;DME Instruction;Gait training;Stair training;Functional mobility training;Therapeutic activities;Therapeutic exercise;Balance training;Neuromuscular re-education;Patient/family education;Orthotic Fit/Training;Manual techniques;Passive range of motion;Dry needling;Energy conservation;Taping    PT Next Visit Plan  progress strengthening exercises and progress bands to ankle weights as tolerated (keep loose d/t skin integrity)    PT Home Exercise Plan  Sit<>stand    Consulted and Agree with Plan of Care  Patient       Patient will benefit from skilled therapeutic intervention in order to improve the following deficits and impairments:  Abnormal gait, Decreased activity tolerance, Decreased balance, Decreased endurance, Decreased knowledge of use  of DME, Decreased mobility, Decreased range of motion, Decreased safety awareness, Decreased strength, Difficulty walking, Impaired perceived functional ability, Impaired flexibility, Impaired sensation, Impaired UE functional use, Improper body mechanics, Postural dysfunction  Visit Diagnosis: Unsteadiness on feet  Muscle weakness (generalized)  Other abnormalities of gait and mobility  Problem List Patient Active Problem List   Diagnosis Date Noted  . Lymphedema 10/11/2017  . Chronic venous insufficiency 10/11/2017  . Leg pain 10/11/2017  . Leg swelling 10/11/2017  . COPD (chronic obstructive pulmonary disease) ([place]) 10/11/2017  . Essential hypertension 10/11/2017   12:14 PM, 01/02/18 Etta Grandchild, PT, DPT Relief Physical Therapist - [place] [phone] (Office)   Woodward Klem C 01/02/2018, 12:14 PM  [facility] [address], [place], [address] Phone: [phone]   Fax:  [phone]  Name: KRISHAUNA SCHATZMAN MRN: [id number] Date of Birth: 05-Dec-1930

## 2018-01-03 NOTE — Progress Notes (Signed)
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 [name] Date of Service: 01/02/2018 Medical Record Number: [id number] Patient Account Number: [id number] Date of Birth/Sex: May 12, 1930 (84 y.o. Female) Treating RN: Renne Crigler Primary Care Provider: Aram Beecham Other Clinician: Referring Provider: Aram Beecham Treating Provider/Extender: Linwood Dibbles, HOYT Weeks in Treatment: 7 Diagnosis Coding ICD-10 Codes Code Description [id number] Laceration without foreign body, right lower leg, initial encounter L97.812 Non-pressure chronic ulcer of other part of right lower leg with fat layer exposed Facility Procedures CPT4 Code Description: 13244010 11042 - DEB SUBQ TISSUE 20 SQ CM/< ICD-10 Diagnosis Description L97.812 Non-pressure chronic  ulcer of other part of right lower leg wit Modifier: h fat layer expo Quantity: 1 sed Physician Procedures CPT4 Code  Description: 4098119 11042 - WC PHYS SUBQ TISS 20 SQ CM ICD-10 Diagnosis Description L97.812 Non-pressure chronic ulcer of other part of right lower leg wit Modifier: h fat layer expo Quantity: 1 sed Electronic Signature(s) Signed: 01/03/2018 7:56:11 AM By: Lenda Kelp PA-C Entered By: Lenda Kelp on 01/02/2018 15:57:50

## 2018-01-04 ENCOUNTER — Encounter: Payer: Self-pay | Admitting: Physical Therapy

## 2018-01-04 ENCOUNTER — Ambulatory Visit: Payer: Medicare Other | Admitting: Physical Therapy

## 2018-01-04 DIAGNOSIS — R2681 Unsteadiness on feet: Secondary | ICD-10-CM

## 2018-01-04 DIAGNOSIS — M79604 Pain in right leg: Secondary | ICD-10-CM

## 2018-01-04 DIAGNOSIS — M79605 Pain in left leg: Secondary | ICD-10-CM

## 2018-01-04 NOTE — Therapy (Signed)
[name] [address] [place], [address] Phone: [phone]   Fax:  [phone]  Physical Therapy Treatment  Patient Details  Name: [name] MRN: [id number] Date of Birth: 08-28-1930 Referring Provider: Aram Beecham, MD   Encounter Date: 01/04/2018    Past Medical History:  Diagnosis Date  . Anemia   . GERD (gastroesophageal reflux disease)   . Hypertension   . Syncope     History reviewed. No pertinent surgical history.  There were no vitals filed for this visit.  Subjective Assessment - 01/04/18 1433    Subjective  Pt reports she is doing well today, just that she feels "jittery" and fears she will fall.     Pertinent History  Pt is well known to this clinic, just recently d/c from this clinic on 12/6 after being seen for R shoulder pain.  Pt reports that her balance is not good and finds herself unsteady at times.  Pt has had EMGs performed BUEs and BLEs which were both negative.  Pt reports she does have numbness in L foot up to ankle at all times.  Pt reports that her balance is worse when standing still, improves when she is moving. Goals: would like to be able to walk through the grass to check on her garden. Pt with h/o ACDF C4-6 on 04/19/17. Pt has a h/o 3 surgeries in her lumbar spine.     Limitations  Standing;Walking    How long can you sit comfortably?  1 hr    How long can you stand comfortably?  10 minutes    How long can you walk comfortably?  2 or 3 blocks    Diagnostic tests  EMG testing BLEs and BUEs which were negative    Patient Stated Goals  To improve her balance, to be able to walk her dog    Currently in Pain?  No/denies    Pain Score  0-No pain    Pain Onset  More than a month ago          Ther-Ex -Nustep during hx intake and education on muscular strength gains in relation to decreasing fall risk -Standing hip ext 3x 10 w/ 2# ankle wt with BUE support at  treadmill and VC for glute activation -Standing hip abd 3x 10 w/ 2# ankle wt with BUE support at treadmill and VC for proper pelvic alignment (patient requires prolonged rest break following this exercise) -Heel raises 3x 10 w/ 1 finger support (each hand) w/ cuing needed for eccentric control -Lateral step downs 3x 10 with BUE support on stair rail with cuing needed for erect posture throughout exercise -OMEGA leg press 3x 10 65#                      PT Education - 01/04/18 1435    Education provided  Yes    Education Details  Education on strengthening LE to reduce fall risk    Person(s) Educated  Patient    Methods  Explanation    Comprehension  Verbalized understanding       PT Short Term Goals - 12/28/17 1546      PT SHORT TERM GOAL #1   Title  Pt will be independent with HEP for carryover between sessions    Time  2    Period  Weeks    Status  New        PT Long Term  Goals - 12/28/17 1546      PT LONG TERM GOAL #1   Title  Pt will improve Berg score to at least 45/56 to demonstrate improved balance    Baseline  35/56    Time  5    Period  Weeks    Status  New      PT LONG TERM GOAL #2   Title  Pt will improve 5xSTS time to at least 15 seconds to demonstrate improved BLE strength and balance    Baseline  25:30    Time  4    Period  Weeks    Status  New      PT LONG TERM GOAL #3   Title  Pt will demonstrate BLE strength at least 4/5 throughout for improved functional mobility    Baseline  See Evaluation    Time  6    Period  Weeks    Status  New      PT LONG TERM GOAL #4   Title  Pt will be able to walk through her grass to check on her garden without LOB for improved QOL    Baseline  Unable     Time  4    Period  Weeks    Status  New      PT LONG TERM GOAL #5   Title  Pt will improve to at least 1.0 m/s for pt to transition to community ambulator    Baseline  0.58 m/s    Time  4    Period  Weeks    Status  New      PT LONG  TERM GOAL #6   Title  Pt will improve ABC scale to at least 30% to demonstrate decreased effect of impaired balance on daily activities    Baseline  51%    Time  4    Period  Weeks    Status  New              Patient will benefit from skilled therapeutic intervention in order to improve the following deficits and impairments:     Visit Diagnosis: Unsteadiness on feet  Pain in both lower extremities     Problem List Patient Active Problem List   Diagnosis Date Noted  . Lymphedema 10/11/2017  . Chronic venous insufficiency 10/11/2017  . Leg pain 10/11/2017  . Leg swelling 10/11/2017  . COPD (chronic obstructive pulmonary disease) (HCC) 10/11/2017  . Essential hypertension 10/11/2017   Staci Acosta PT, DPT  Staci Acosta 01/04/2018, 3:01 PM  [facility] [address], [place], [address] Phone: [phone]   Fax:  [phone]  Name: [name] MRN: [id number] Date of Birth: June 25, 1930

## 2018-01-05 NOTE — Progress Notes (Signed)
[name], [name] ([id number]) Visit Report for 01/02/2018 Arrival Information Details Patient Name: [name], [name]. Date of Service: 01/02/2018 2:45 PM Medical Record Number: [id number] Patient Account Number: [id number] Date of Birth/Sex: 04-07-1930 (84 y.o. Female) Treating RN: Renne Crigler Primary Care Avianah Pellman: Aram Beecham Other Clinician: Referring Marlisa Caridi: Aram Beecham Treating Kirsta Probert/Extender: Linwood Dibbles, HOYT Weeks in Treatment: 7 Visit Information History Since Last Visit All ordered tests and consults were completed: No Patient Arrived: Ambulatory Added or deleted any medications: No Arrival Time: 15:16 Any new allergies or adverse reactions: No Accompanied By: self Had a fall or experienced change in No Transfer Assistance: None activities of daily living that may affect Patient Identification Verified: Yes risk of falls: Secondary Verification Process Completed: Yes Signs or symptoms of abuse/neglect since last visito No Patient Requires Transmission-Based No Hospitalized since last visit: No Precautions: Pain Present Now: No Patient Has Alerts: No Electronic Signature(s) Signed: 01/02/2018 4:49:38 PM By: Renne Crigler Entered By: Renne Crigler on 01/02/2018 15:19:44 [name] ([id number]) -------------------------------------------------------------------------------- Encounter Discharge Information Details Patient Name: [name] Date of Service: 01/02/2018 2:45 PM Medical Record Number: [id number] Patient Account Number: [id number] Date of Birth/Sex: [birth year] (84 y.o. Female) Treating RN: Renne Crigler Primary Care Maximino Cozzolino: Aram Beecham Other Clinician: Referring Branden Shallenberger: Aram Beecham Treating Tyrina Hines/Extender: Linwood Dibbles, HOYT Weeks in Treatment: 7 Encounter Discharge Information Items Discharge Pain Level: 0 Discharge Condition: Stable Ambulatory Status: Cane Discharge Destination: Home Transportation: Private  Auto Accompanied By: self Schedule Follow-up Appointment: Yes Medication Reconciliation completed and No provided to Patient/Care Rondell Frick: Provided on Clinical Summary of Care: 01/02/2018 Form Type Recipient Paper Patient [facility] Electronic Signature(s) Signed: 01/04/2018 11:25:55 AM By: Gwenlyn Perking Entered By: Gwenlyn Perking on 01/02/2018 15:52:05 [name] ([id number]) -------------------------------------------------------------------------------- Lower Extremity Assessment Details Patient Name: [name] Date of Service: 01/02/2018 2:45 PM Medical Record Number: [id number] Patient Account Number: [id number] Date of Birth/Sex: 1929-12-26 (84 y.o. Female) Treating RN: Renne Crigler Primary Care Shayan Bramhall: Aram Beecham Other Clinician: Referring Leaf Kernodle: Aram Beecham Treating Cherrish Vitali/Extender: Linwood Dibbles, HOYT Weeks in Treatment: 7 Edema Assessment Assessed: [Left: No] [Right: No] Edema: [Left: N] [Right: o] Vascular Assessment Claudication: Claudication Assessment [Right:None] Pulses: Dorsalis Pedis Palpable: [Right:Yes] Posterior Tibial Extremity colors, hair growth, and conditions: Extremity Color: [Right:Normal] Hair Growth on Extremity: [Right:No] Temperature of Extremity: [Right:Cool] Capillary Refill: [Right:< 3 seconds] Toe Nail Assessment Left: Right: Thick: No Discolored: No Deformed: No Improper Length and Hygiene: No Electronic Signature(s) Signed: 01/02/2018 4:49:38 PM By: Renne Crigler Entered By: Renne Crigler on 01/02/2018 15:27:50 [name] ([id number]) -------------------------------------------------------------------------------- Multi Wound Chart Details Patient Name: [name] Date of Service: 01/02/2018 2:45 PM Medical Record Number: [id number] Patient Account Number: [id number] Date of Birth/Sex: 29-Oct-1930 (84 y.o. Female) Treating RN: Renne Crigler Primary Care Gabrien Mentink: Aram Beecham Other  Clinician: Referring Madisen Ludvigsen: Aram Beecham Treating Terence Googe/Extender: Linwood Dibbles, HOYT Weeks in Treatment: 7 Vital Signs Height(in): 60 Pulse(bpm): 72 Weight(lbs): 123 Blood Pressure(mmHg): 120/50 Body Mass Index(BMI): 24 Temperature(F): 98.0 Respiratory Rate 16 (breaths/min): Photos: [5:No Photos] [N/A:N/A] Wound Location: [5:Right Lower Leg - Anterior] [N/A:N/A] Wounding Event: [5:Trauma] [N/A:N/A] Primary Etiology: [5:Trauma, Other] [N/A:N/A] Comorbid History: [5:Hypertension, Gout, Osteoarthritis, Neuropathy] [N/A:N/A] Date Acquired: [5:09/05/2017] [N/A:N/A] Weeks of Treatment: [5:7] [N/A:N/A] Wound Status: [5:Open] [N/A:N/A] Measurements L x W x D [5:1.1x1x0.2] [N/A:N/A] (cm) Area (cm) : [5:0.864] [N/A:N/A] Volume (cm) : [5:0.173] [N/A:N/A] % Reduction in Area: [5:34.50%] [N/A:N/A] % Reduction in Volume: [5:-31.10%] [N/A:N/A] Classification: [5:Full Thickness With Exposed Support Structures] [N/A:N/A] Exudate Amount: [5:Medium] [N/A:N/A] Exudate  Type: [5:Serous] [N/A:N/A] Exudate Color: [5:amber] [N/A:N/A] Wound Margin: [5:Distinct, outline attached] [N/A:N/A] Granulation Amount: [5:Small (1-33%)] [N/A:N/A] Granulation Quality: [5:Red] [N/A:N/A] Necrotic Amount: [5:Large (67-100%)] [N/A:N/A] Exposed Structures: [5:Fat Layer (Subcutaneous Tissue) Exposed: Yes Fascia: No Tendon: No Muscle: No Joint: No Bone: No] [N/A:N/A] Epithelialization: [5:None] [N/A:N/A] Periwound Skin Texture: [5:Induration: Yes Excoriation: No Callus: No Crepitus: No] [N/A:N/A] Rash: No Scarring: No Periwound Skin Moisture: Maceration: No N/A N/A Dry/Scaly: No Periwound Skin Color: Atrophie Blanche: No N/A N/A Cyanosis: No Ecchymosis: No Erythema: No Hemosiderin Staining: No Mottled: No Pallor: No Rubor: No Temperature: No Abnormality N/A N/A Tenderness on Palpation: Yes N/A N/A Wound Preparation: Ulcer Cleansing: N/A N/A Rinsed/Irrigated with Saline Topical Anesthetic  Applied: Other: lidocaine 4% Treatment Notes Electronic Signature(s) Signed: 01/02/2018 4:49:38 PM By: Renne Crigler Entered By: Renne Crigler on 01/02/2018 15:28:04 [name] ([id number]) -------------------------------------------------------------------------------- Multi-Disciplinary Care Plan Details Patient Name: [name] Date of Service: 01/02/2018 2:45 PM Medical Record Number: [id number] Patient Account Number: [id number] Date of Birth/Sex: 11-24-1930 (84 y.o. Female) Treating RN: Renne Crigler Primary Care Briea Mcenery: Aram Beecham Other Clinician: Referring Dickie Labarre: Aram Beecham Treating Havoc Sanluis/Extender: Linwood Dibbles, HOYT Weeks in Treatment: 7 Active Inactive ` Orientation to the Wound Care Program Nursing Diagnoses: Knowledge deficit related to the wound healing center program Goals: Patient/caregiver will verbalize understanding of the Wound Healing Center Program Date Initiated: 11/14/2017 Target Resolution Date: 11/28/2017 Goal Status: Active Interventions: Provide education on orientation to the wound center Notes: ` Wound/Skin Impairment Nursing Diagnoses: Impaired tissue integrity Knowledge deficit related to ulceration/compromised skin integrity Goals: Patient/caregiver will verbalize understanding of skin care regimen Date Initiated: 11/14/2017 Target Resolution Date: 11/28/2017 Goal Status: Active Ulcer/skin breakdown will have a volume reduction of 30% by week 4 Date Initiated: 11/14/2017 Target Resolution Date: 11/28/2017 Goal Status: Active Interventions: Assess patient/caregiver ability to obtain necessary supplies Assess patient/caregiver ability to perform ulcer/skin care regimen upon admission and as needed Assess ulceration(s) every visit Treatment Activities: Skin care regimen initiated : 11/14/2017 Notes: Electronic Signature(s) Signed: 01/02/2018 4:49:38 PM By: Lenore Cordia, Lawernce Keas  ([id number]) Entered By: Renne Crigler on 01/02/2018 15:27:54 [name] ([id number]) -------------------------------------------------------------------------------- Pain Assessment Details Patient Name: [name] Date of Service: 01/02/2018 2:45 PM Medical Record Number: [id number] Patient Account Number: [id number] Date of Birth/Sex: 09-12-1930 (84 y.o. Female) Treating RN: Renne Crigler Primary Care Lael Pilch: Aram Beecham Other Clinician: Referring Anna-Marie Coller: Aram Beecham Treating Casondra Gasca/Extender: Linwood Dibbles, HOYT Weeks in Treatment: 7 Active Problems Location of Pain Severity and Description of Pain Patient Has Paino No Site Locations Pain Management and Medication Current Pain Management: Electronic Signature(s) Signed: 01/02/2018 4:49:38 PM By: Renne Crigler Entered By: Renne Crigler on 01/02/2018 15:19:51 [name] ([id number]) -------------------------------------------------------------------------------- Patient/Caregiver Education Details Patient Name: [name] Date of Service: 01/02/2018 2:45 PM Medical Record Number: [id number] Patient Account Number: [id number] Date of Birth/Gender: 1930/03/05 (84 y.o. Female) Treating RN: Renne Crigler Primary Care Physician: Aram Beecham Other Clinician: Referring Physician: Aram Beecham Treating Physician/Extender: Skeet Simmer in Treatment: 7 Education Assessment Education Provided To: Patient Education Topics Provided Wound Debridement: Handouts: Wound Debridement Methods: Explain/Verbal Responses: State content correctly Wound/Skin Impairment: Handouts: Caring for Your Ulcer Methods: Explain/Verbal Responses: State content correctly Electronic Signature(s) Signed: 01/02/2018 4:49:38 PM By: Renne Crigler Entered By: Renne Crigler on 01/02/2018 15:50:46 [name]  ([id number]) -------------------------------------------------------------------------------- Wound Assessment Details Patient Name: [name] Date of Service: 01/02/2018 2:45 PM Medical Record Number: [id number] Patient Account Number: [id number] Date of Birth/Sex: 1930/01/21 (84  y.o. Female) Treating RN: Renne Crigler Primary Care Mardell Cragg: Aram Beecham Other Clinician: Referring Wilho Sharpley: Aram Beecham Treating Nochum Fenter/Extender: Linwood Dibbles, HOYT Weeks in Treatment: 7 Wound Status Wound Number: 5 Primary Etiology: Trauma, Other Wound Location: Right Lower Leg - Anterior Wound Status: Open Wounding Event: Trauma Comorbid Hypertension, Gout, Osteoarthritis, History: Neuropathy Date Acquired: 09/05/2017 Weeks Of Treatment: 7 Clustered Wound: No Photos Photo Uploaded By: Renne Crigler on 01/02/2018 16:11:24 Wound Measurements Length: (cm) 1.1 Width: (cm) 1 Depth: (cm) 0.2 Area: (cm) 0.864 Volume: (cm) 0.173 % Reduction in Area: 34.5% % Reduction in Volume: -31.1% Epithelialization: None Tunneling: No Undermining: No Wound Description Full Thickness With Exposed Support Foul Classification: Structures Slou Wound Margin: Distinct, outline attached Exudate Medium Amount: Exudate Type: Serous Exudate Color: amber Odor After Cleansing: No gh/Fibrino Yes Wound Bed Granulation Amount: Small (1-33%) Exposed Structure Granulation Quality: Red Fascia Exposed: No Necrotic Amount: Large (67-100%) Fat Layer (Subcutaneous Tissue) Exposed: Yes Necrotic Quality: Adherent Slough Tendon Exposed: No Muscle Exposed: No Joint Exposed: No Bone Exposed: No ANTANIYA, [name] ([id number]) Periwound Skin Texture Texture Color No Abnormalities Noted: No No Abnormalities Noted: No Callus: No Atrophie Blanche: No Crepitus: No Cyanosis: No Excoriation: No Ecchymosis: No Induration: Yes Erythema: No Rash: No Hemosiderin Staining: No Scarring: No Mottled:  No Pallor: No Moisture Rubor: No No Abnormalities Noted: No Dry / Scaly: No Temperature / Pain Maceration: No Temperature: No Abnormality Tenderness on Palpation: Yes Wound Preparation Ulcer Cleansing: Rinsed/Irrigated with Saline Topical Anesthetic Applied: Other: lidocaine 4%, Treatment Notes Wound #5 (Right, Anterior Lower Leg) 1. Cleansed with: Clean wound with Normal Saline 2. Anesthetic Topical Lidocaine 4% cream to wound bed prior to debridement Hurricaine Topical Anesthetic Spray 4. Dressing Applied: Iodoflex 5. Secondary Dressing Applied Bordered Foam Dressing Electronic Signature(s) Signed: 01/02/2018 4:49:38 PM By: Renne Crigler Entered By: Renne Crigler on 01/02/2018 15:26:28 [name] ([id number]) -------------------------------------------------------------------------------- Vitals Details Patient Name: [name] Date of Service: 01/02/2018 2:45 PM Medical Record Number: [id number] Patient Account Number: [id number] Date of Birth/Sex: 1930/03/25 (84 y.o. Female) Treating RN: Renne Crigler Primary Care Dace Denn: Aram Beecham Other Clinician: Referring Dimitry Holsworth: Aram Beecham Treating Dailyn Reith/Extender: Linwood Dibbles, HOYT Weeks in Treatment: 7 Vital Signs Time Taken: 03:19 Temperature (F): 98.0 Height (in): 60 Pulse (bpm): 72 Weight (lbs): 123 Respiratory Rate (breaths/min): 16 Body Mass Index (BMI): 24 Blood Pressure (mmHg): 120/50 Reference Range: 80 - 120 mg / dl Electronic Signature(s) Signed: 01/02/2018 4:49:38 PM By: Renne Crigler Entered By: Renne Crigler on 01/02/2018 15:21:26

## 2018-01-09 ENCOUNTER — Ambulatory Visit: Payer: Medicare Other | Admitting: Physical Therapy

## 2018-01-09 ENCOUNTER — Encounter: Payer: Self-pay | Admitting: Physical Therapy

## 2018-01-09 ENCOUNTER — Encounter: Payer: Medicare Other | Admitting: Nurse Practitioner

## 2018-01-09 DIAGNOSIS — M79605 Pain in left leg: Principal | ICD-10-CM

## 2018-01-09 DIAGNOSIS — R2681 Unsteadiness on feet: Secondary | ICD-10-CM

## 2018-01-09 DIAGNOSIS — L97812 Non-pressure chronic ulcer of other part of right lower leg with fat layer exposed: Secondary | ICD-10-CM | POA: Diagnosis not present

## 2018-01-09 DIAGNOSIS — M6281 Muscle weakness (generalized): Secondary | ICD-10-CM

## 2018-01-09 DIAGNOSIS — M79604 Pain in right leg: Secondary | ICD-10-CM

## 2018-01-09 NOTE — Therapy (Signed)
[facility] [address], [place], [address] Phone: [phone]   Fax:  [phone]  Physical Therapy Treatment  Patient Details  Name: [name] MRN: [id number] Date of Birth: 12/07/1930 Referring Provider: Aram Beecham, MD   Encounter Date: 01/09/2018  PT End of Session - 01/09/18 1401    Visit Number  10    Number of Visits  13    Date for PT Re-Evaluation  01/09/18    PT Start Time  0152    PT Stop Time  0230    PT Time Calculation (min)  38 min    Equipment Utilized During Treatment  Gait belt    Activity Tolerance  Patient tolerated treatment well;Patient limited by fatigue    Behavior During Therapy  [facility] for tasks assessed/performed       Past Medical History:  Diagnosis Date  . Anemia   . GERD (gastroesophageal reflux disease)   . Hypertension   . Syncope     History reviewed. No pertinent surgical history.  There were no vitals filed for this visit.  Subjective Assessment - 01/09/18 1358    Subjective  Pt arrived late to PT d/t weather. Pt reports she was sore following last treatment, but that soreness subsided within 24hours. Patient reports she is feeling good today, and thinks she will do better with PT in the afternoon.     Pertinent History  Pt is well known to this clinic, just recently d/c from this clinic on 12/6 after being seen for R shoulder pain.  Pt reports that her balance is not good and finds herself unsteady at times.  Pt has had EMGs performed BUEs and BLEs which were both negative.  Pt reports she does have numbness in L foot up to ankle at all times.  Pt reports that her balance is worse when standing still, improves when she is moving. Goals: would like to be able to walk through the grass to check on her garden. Pt with h/o ACDF C4-6 on 04/19/17. Pt has a h/o 3 surgeries in her lumbar spine.     Limitations  Standing;Walking    How long can you sit comfortably?  1 hr     How long can you stand comfortably?  10 minutes    How long can you walk comfortably?  2 or 3 blocks    Diagnostic tests  EMG testing BLEs and BUEs which were negative    Patient Stated Goals  To improve her balance, to be able to walk her dog    Pain Onset  More than a month ago          Ther-Ex -Nustep: 4 min during hx intake and education -Standing hip abd w/ 1# ankle wt 3x 10 with VC for glute activation w/ treadmill bar support -Standing hip ext w/ 1# ankle wt 3x 10 with VC for glute activation w/ treadmill bar support *patient requires seated rest breaks between exercises*  Neuro-Muscular Re-Ed  -Side stepping balance activity on 5.5 airex bad with treadmill support 6x down and back starting with bilat UE support> 2 finger support > 1 finger support > "hovering hands" support w/ PT contact guard for safety -Airex static standing positions: feet shoulder width apart with bilat 2 fingers support> 1 finger support > hovering hands holding for 30sec at a time, repeat with feet together in all positions -Standing on airex pad w/ dynamic reaching activity reaching with BUE outside  patient's BOS with CGA from PT  -Ambulating with cane through gym with sudden cues to "stop and turn" turning to the R and L with standing rest breaks between ambulation trials w/ PT contact guard assist for safety -Ambulation with random distraction (another PT running in front of patient) where patient was able to stop and maintain balance w/ no PT assistance                     PT Education - 01/09/18 1359    Education provided  Yes    Education Details  Educated patient on muscular soreness and time frame for soreness to reduce following exercise    Person(s) Educated  Patient    Methods  Explanation    Comprehension  Verbalized understanding       PT Short Term Goals - 12/28/17 1546      PT SHORT TERM GOAL #1   Title  Pt will be independent with HEP for carryover between  sessions    Time  2    Period  Weeks    Status  New        PT Long Term Goals - 12/28/17 1546      PT LONG TERM GOAL #1   Title  Pt will improve Berg score to at least 45/56 to demonstrate improved balance    Baseline  35/56    Time  5    Period  Weeks    Status  New      PT LONG TERM GOAL #2   Title  Pt will improve 5xSTS time to at least 15 seconds to demonstrate improved BLE strength and balance    Baseline  25:30    Time  4    Period  Weeks    Status  New      PT LONG TERM GOAL #3   Title  Pt will demonstrate BLE strength at least 4/5 throughout for improved functional mobility    Baseline  See Evaluation    Time  6    Period  Weeks    Status  New      PT LONG TERM GOAL #4   Title  Pt will be able to walk through her grass to check on her garden without LOB for improved QOL    Baseline  Unable     Time  4    Period  Weeks    Status  New      PT LONG TERM GOAL #5   Title  Pt will improve to at least 1.0 m/s for pt to transition to community ambulator    Baseline  0.58 m/s    Time  4    Period  Weeks    Status  New      PT LONG TERM GOAL #6   Title  Pt will improve ABC scale to at least 30% to demonstrate decreased effect of impaired balance on daily activities    Baseline  51%    Time  4    Period  Weeks    Status  New            Plan - 01/09/18 1413    Clinical Impression Statement  Pt continued to tolerated        Patient will benefit from skilled therapeutic intervention in order to improve the following deficits and impairments:     Visit Diagnosis: Pain in both lower extremities  Unsteadiness on feet  Muscle weakness (  generalized)     Problem List Patient Active Problem List   Diagnosis Date Noted  . Lymphedema 10/11/2017  . Chronic venous insufficiency 10/11/2017  . Leg pain 10/11/2017  . Leg swelling 10/11/2017  . COPD (chronic obstructive pulmonary disease) (HCC) 10/11/2017  . Essential hypertension 10/11/2017     Staci Acosta PT, DPT  Staci Acosta 01/09/2018, 2:54 PM  [facility] [address], [place], [address] Phone: [phone]   Fax:  [phone]  Name: [name] MRN: [id number] Date of Birth: 1930-09-28
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
## 2018-01-11 ENCOUNTER — Encounter: Payer: Medicare Other | Admitting: Physical Therapy

## 2018-01-14 NOTE — Progress Notes (Signed)
ELVIA, BROSCH ([id number]) Visit Report for 01/09/2018 Arrival Information Details Patient Name: [name], [name]. Date of Service: 01/09/2018 9:30 AM Medical Record Number: [id number] Patient Account Number: [id number] Date of Birth/Sex: June 29, 1930 (84 y.o. Female) Treating RN: Renne Crigler Primary Care Jaquita Bessire: Aram Beecham Other Clinician: Referring Simone Tuckey: Aram Beecham Treating Douglas Rooks/Extender: Kathreen Cosier in Treatment: 8 Visit Information History Since Last Visit All ordered tests and consults were completed: No Patient Arrived: Gilmer Mor Added or deleted any medications: No Arrival Time: 09:47 Any new allergies or adverse reactions: No Accompanied By: self Had a fall or experienced change in No Transfer Assistance: None activities of daily living that may affect Patient Requires Transmission-Based Precautions: No risk of falls: Patient Has Alerts: No Signs or symptoms of abuse/neglect since last visito No Hospitalized since last visit: No Pain Present Now: No Electronic Signature(s) Signed: 01/09/2018 2:06:29 PM By: Renne Crigler Entered By: Renne Crigler on 01/09/2018 09:48:13 [name] ([id number]) -------------------------------------------------------------------------------- Encounter Discharge Information Details Patient Name: [name] Date of Service: 01/09/2018 9:30 AM Medical Record Number: [id number] Patient Account Number: [id number] Date of Birth/Sex: 08-03-1930 (84 y.o. Female) Treating RN: Renne Crigler Primary Care Richel Millspaugh: Aram Beecham Other Clinician: Referring Jamelah Sitzer: Aram Beecham Treating Anabelen Kaminsky/Extender: Kathreen Cosier in Treatment: 8 Encounter Discharge Information Items Discharge Pain Level: 0 Discharge Condition: Stable Ambulatory Status: Cane Discharge Destination: Home Transportation: Private Auto Accompanied By: self Schedule Follow-up Appointment: Yes Medication Reconciliation  completed and No provided to Patient/Care [name]: Provided on Clinical Summary of Care: 01/09/2018 Form Type Recipient Paper Patient [facility] Electronic Signature(s) Signed: 01/12/2018 4:27:15 PM By: Gwenlyn Perking Entered By: Gwenlyn Perking on 01/09/2018 10:30:12 [name] ([id number]) -------------------------------------------------------------------------------- Lower Extremity Assessment Details Patient Name: [name] Date of Service: 01/09/2018 9:30 AM Medical Record Number: [id number] Patient Account Number: [id number] Date of Birth/Sex: [birth year] (84 y.o. Female) Treating RN: Renne Crigler Primary Care Melbourne Jakubiak: Aram Beecham Other Clinician: Referring Narissa Beaufort: Aram Beecham Treating Debar Plate/Extender: Kathreen Cosier in Treatment: 8 Edema Assessment Assessed: [Left: No] [Right: No] Edema: [Left: N] [Right: o] Vascular Assessment Claudication: Claudication Assessment [Right:None] Pulses: Dorsalis Pedis Palpable: [Right:Yes] Posterior Tibial Extremity colors, hair growth, and conditions: Extremity Color: [Right:Normal] Hair Growth on Extremity: [Right:No] Temperature of Extremity: [Right:Cool] Capillary Refill: [Right:< 3 seconds] Toe Nail Assessment Left: Right: Thick: No Discolored: No Deformed: No Improper Length and Hygiene: No Electronic Signature(s) Signed: 01/09/2018 2:06:29 PM By: Renne Crigler Entered By: Renne Crigler on 01/09/2018 09:58:13 [name] ([id number]) -------------------------------------------------------------------------------- Multi Wound Chart Details Patient Name: [name] Date of Service: 01/09/2018 9:30 AM Medical Record Number: [id number] Patient Account Number: [id number] Date of Birth/Sex: 05/06/1930 (84 y.o. Female) Treating RN: Renne Crigler Primary Care Joh Rao: Aram Beecham Other Clinician: Referring Tytionna Cloyd: Aram Beecham Treating Lonny Eisen/Extender: Kathreen Cosier in  Treatment: 8 Vital Signs Height(in): 60 Pulse(bpm): 69 Weight(lbs): 123 Blood Pressure(mmHg): 113/46 Body Mass Index(BMI): 24 Temperature(F): 98.0 Respiratory Rate 16 (breaths/min): Photos: [5:No Photos] [N/A:N/A] Wound Location: [5:Right Lower Leg - Anterior] [N/A:N/A] Wounding Event: [5:Trauma] [N/A:N/A] Primary Etiology: [5:Trauma, Other] [N/A:N/A] Comorbid History: [5:Hypertension, Gout, Osteoarthritis, Neuropathy] [N/A:N/A] Date Acquired: [5:09/05/2017] [N/A:N/A] Weeks of Treatment: [5:8] [N/A:N/A] Wound Status: [5:Open] [N/A:N/A] Measurements L x W x D [5:1.1x1x0.1] [N/A:N/A] (cm) Area (cm) : [5:0.864] [N/A:N/A] Volume (cm) : [5:0.086] [N/A:N/A] % Reduction in Area: [5:34.50%] [N/A:N/A] % Reduction in Volume: [5:34.80%] [N/A:N/A] Classification: [5:Full Thickness With Exposed Support Structures] [N/A:N/A] Exudate Amount: [5:Medium] [N/A:N/A] Exudate Type: [5:Serous] [N/A:N/A] Exudate Color: [5:amber] [N/A:N/A] Wound Margin: [5:Distinct, outline attached] [N/A:N/A]  Granulation Amount: [5:Small (1-33%)] [N/A:N/A] Granulation Quality: [5:Red] [N/A:N/A] Necrotic Amount: [5:Large (67-100%)] [N/A:N/A] Exposed Structures: [5:Fat Layer (Subcutaneous Tissue) Exposed: Yes Fascia: No Tendon: No Muscle: No Joint: No Bone: No] [N/A:N/A] Epithelialization: [5:None] [N/A:N/A] Debridement: [5:Open Wound/Selective ([id number]) - Selective] [N/A:N/A] Pre-procedure [5:10:09] [N/A:N/A] Verification/Time Out Taken: Pain Control: [5:Other] [N/A:N/A] Tissue Debrided: Fibrin/Slough, Skin N/A N/A Level: Non-Viable Tissue N/A N/A Debridement Area (sq cm): 1.1 N/A N/A Instrument: Curette N/A N/A Bleeding: Minimum N/A N/A Hemostasis Achieved: Pressure N/A N/A Procedural Pain: 0 N/A N/A Post Procedural Pain: 0 N/A N/A Debridement Treatment Procedure was tolerated well N/A N/A Response: Post Debridement 1.1x1x0.2 N/A N/A Measurements L x W x D (cm) Post Debridement Volume: 0.173  N/A N/A (cm) Periwound Skin Texture: Induration: Yes N/A N/A Scarring: Yes Excoriation: No Callus: No Crepitus: No Rash: No Periwound Skin Moisture: Maceration: No N/A N/A Dry/Scaly: No Periwound Skin Color: Atrophie Blanche: No N/A N/A Cyanosis: No Ecchymosis: No Erythema: No Hemosiderin Staining: No Mottled: No Pallor: No Rubor: No Temperature: No Abnormality N/A N/A Tenderness on Palpation: Yes N/A N/A Wound Preparation: Ulcer Cleansing: N/A N/A Rinsed/Irrigated with Saline Topical Anesthetic Applied: Other: lidocaine 4% Procedures Performed: Debridement N/A N/A Treatment Notes Electronic Signature(s) Signed: 01/09/2018 2:06:29 PM By: Renne Crigler Entered By: Renne Crigler on 01/09/2018 10:27:24 [name] ([id number]) -------------------------------------------------------------------------------- Multi-Disciplinary Care Plan Details Patient Name: [name] Date of Service: 01/09/2018 9:30 AM Medical Record Number: [id number] Patient Account Number: [id number] Date of Birth/Sex: 06-16-1930 (84 y.o. Female) Treating RN: Renne Crigler Primary Care Keeven Matty: Aram Beecham Other Clinician: Referring Trinh Sanjose: Aram Beecham Treating Zane Samson/Extender: Kathreen Cosier in Treatment: 8 Active Inactive ` Orientation to the Wound Care Program Nursing Diagnoses: Knowledge deficit related to the wound healing center program Goals: Patient/caregiver will verbalize understanding of the Wound Healing Center Program Date Initiated: 11/14/2017 Target Resolution Date: 11/28/2017 Goal Status: Active Interventions: Provide education on orientation to the wound center Notes: ` Wound/Skin Impairment Nursing Diagnoses: Impaired tissue integrity Knowledge deficit related to ulceration/compromised skin integrity Goals: Patient/caregiver will verbalize understanding of skin care regimen Date Initiated: 11/14/2017 Target Resolution Date:  11/28/2017 Goal Status: Active Ulcer/skin breakdown will have a volume reduction of 30% by week 4 Date Initiated: 11/14/2017 Target Resolution Date: 11/28/2017 Goal Status: Active Interventions: Assess patient/caregiver ability to obtain necessary supplies Assess patient/caregiver ability to perform ulcer/skin care regimen upon admission and as needed Assess ulceration(s) every visit Treatment Activities: Skin care regimen initiated : 11/14/2017 Notes: Electronic Signature(s) Signed: 01/09/2018 2:06:29 PM By: Lenore Cordia, Lawernce Keas ([id number]) Entered By: Renne Crigler on 01/09/2018 10:27:11 [name] ([id number]) -------------------------------------------------------------------------------- Pain Assessment Details Patient Name: [name] Date of Service: 01/09/2018 9:30 AM Medical Record Number: [id number] Patient Account Number: [id number] Date of Birth/Sex: 02-Sep-1930 (84 y.o. Female) Treating RN: Renne Crigler Primary Care Alonza Knisley: Aram Beecham Other Clinician: Referring Nicklas Mcsweeney: Aram Beecham Treating Shanora Christensen/Extender: Kathreen Cosier in Treatment: 8 Active Problems Location of Pain Severity and Description of Pain Patient Has Paino No Site Locations Pain Management and Medication Current Pain Management: Notes states tender to touch Electronic Signature(s) Signed: 01/09/2018 2:06:29 PM By: Renne Crigler Entered By: Renne Crigler on 01/09/2018 09:48:42 [name] ([id number]) -------------------------------------------------------------------------------- Patient/Caregiver Education Details Patient Name: [name] Date of Service: 01/09/2018 9:30 AM Medical Record Number: [id number] Patient Account Number: [id number] Date of Birth/Gender: September 03, 1930 (84 y.o. Female) Treating RN: Renne Crigler Primary Care Physician: Aram Beecham Other Clinician: Referring Physician: Aram Beecham Treating  Physician/Extender: Kathreen Cosier in Treatment:  8 Education Assessment Education Provided To: Patient Education Topics Provided Wound Debridement: Handouts: Wound Debridement Methods: Explain/Verbal Responses: State content correctly Wound/Skin Impairment: Handouts: Caring for Your Ulcer Methods: Explain/Verbal Responses: State content correctly Electronic Signature(s) Signed: 01/09/2018 2:06:29 PM By: Renne Crigler Entered By: Renne Crigler on 01/09/2018 10:29:53 [name] ([id number]) -------------------------------------------------------------------------------- Wound Assessment Details Patient Name: [name] Date of Service: 01/09/2018 9:30 AM Medical Record Number: [id number] Patient Account Number: [id number] Date of Birth/Sex: [birth year] (84 y.o. Female) Treating RN: Renne Crigler Primary Care Domingos Riggi: Aram Beecham Other Clinician: Referring Jamaury Gumz: Aram Beecham Treating Ezreal Turay/Extender: Kathreen Cosier in Treatment: 8 Wound Status Wound Number: 5 Primary Etiology: Trauma, Other Wound Location: Right Lower Leg - Anterior Wound Status: Open Wounding Event: Trauma Comorbid Hypertension, Gout, Osteoarthritis, History: Neuropathy Date Acquired: 09/05/2017 Weeks Of Treatment: 8 Clustered Wound: No Photos Photo Uploaded By: Renne Crigler on 01/09/2018 11:33:52 Wound Measurements Length: (cm) 1.1 Width: (cm) 1 Depth: (cm) 0.1 Area: (cm) 0.864 Volume: (cm) 0.086 % Reduction in Area: 34.5% % Reduction in Volume: 34.8% Epithelialization: None Tunneling: No Undermining: No Wound Description Full Thickness With Exposed Support Foul Classification: Structures Slou Wound Margin: Distinct, outline attached Exudate Medium Amount: Exudate Type: Serous Exudate Color: amber Odor After Cleansing: No gh/Fibrino Yes Wound Bed Granulation Amount: Small (1-33%) Exposed Structure Granulation Quality: Red Fascia Exposed:  No Necrotic Amount: Large (67-100%) Fat Layer (Subcutaneous Tissue) Exposed: Yes Necrotic Quality: Adherent Slough Tendon Exposed: No Muscle Exposed: No Joint Exposed: No Bone Exposed: No [name], [name] ([id number]) Periwound Skin Texture Texture Color No Abnormalities Noted: No No Abnormalities Noted: No Callus: No Atrophie Blanche: No Crepitus: No Cyanosis: No Excoriation: No Ecchymosis: No Induration: Yes Erythema: No Rash: No Hemosiderin Staining: No Scarring: Yes Mottled: No Pallor: No Moisture Rubor: No No Abnormalities Noted: No Dry / Scaly: No Temperature / Pain Maceration: No Temperature: No Abnormality Tenderness on Palpation: Yes Wound Preparation Ulcer Cleansing: Rinsed/Irrigated with Saline Topical Anesthetic Applied: Other: lidocaine 4%, Treatment Notes Wound #5 (Right, Anterior Lower Leg) 1. Cleansed with: Clean wound with Normal Saline 2. Anesthetic Topical Lidocaine 4% cream to wound bed prior to debridement Hurricaine Topical Anesthetic Spray 4. Dressing Applied: Iodoflex 5. Secondary Dressing Applied Bordered Foam Dressing Electronic Signature(s) Signed: 01/09/2018 2:06:29 PM By: Renne Crigler Entered By: Renne Crigler on 01/09/2018 09:57:07 [name] ([id number]) -------------------------------------------------------------------------------- Vitals Details Patient Name: [name] Date of Service: 01/09/2018 9:30 AM Medical Record Number: [id number] Patient Account Number: [id number] Date of Birth/Sex: [birth year] (84 y.o. Female) Treating RN: Renne Crigler Primary Care Analycia Khokhar: Aram Beecham Other Clinician: Referring Elin Seats: Aram Beecham Treating Jayra Choyce/Extender: Kathreen Cosier in Treatment: 8 Vital Signs Time Taken: 09:52 Temperature (F): 98.0 Height (in): 60 Pulse (bpm): 69 Weight (lbs): 123 Respiratory Rate (breaths/min): 16 Body Mass Index (BMI): 24 Blood Pressure (mmHg):  113/46 Reference Range: 80 - 120 mg / dl Electronic Signature(s) Signed: 01/09/2018 2:06:29 PM By: Renne Crigler Entered By: Renne Crigler on 01/09/2018 09:52:52

## 2018-01-16 ENCOUNTER — Encounter: Payer: Medicare Other | Attending: Physician Assistant | Admitting: Physician Assistant

## 2018-01-16 DIAGNOSIS — I10 Essential (primary) hypertension: Secondary | ICD-10-CM | POA: Diagnosis not present

## 2018-01-16 DIAGNOSIS — L97812 Non-pressure chronic ulcer of other part of right lower leg with fat layer exposed: Secondary | ICD-10-CM | POA: Insufficient documentation

## 2018-01-17 ENCOUNTER — Ambulatory Visit: Payer: Medicare Other | Attending: Internal Medicine | Admitting: Physical Therapy

## 2018-01-17 DIAGNOSIS — R2681 Unsteadiness on feet: Secondary | ICD-10-CM | POA: Diagnosis not present

## 2018-01-17 NOTE — Therapy (Signed)
[facility] [address], [place], [address] Phone: [phone]   Fax:  [phone]  Physical Therapy Treatment  Patient Details  Name: [name] MRN: [id number] Date of Birth: [birth year] Referring Provider: Fulton Reek, MD   Encounter Date: 01/17/2018  PT End of Session - 01/17/18 1414    Visit Number  11    Number of Visits  21    Date for PT Re-Evaluation  03/07/18    PT Start Time  0141    PT Stop Time  0236    PT Time Calculation (min)  55 min    Equipment Utilized During Treatment  Gait belt    Activity Tolerance  Patient tolerated treatment well;Patient limited by fatigue    Behavior During Therapy  [facility] for tasks assessed/performed       Past Medical History:  Diagnosis Date  . Anemia   . GERD (gastroesophageal reflux disease)   . Hypertension   . Syncope     No past surgical history on file.  There were no vitals filed for this visit.  Subjective Assessment - 01/17/18 1356    Subjective  Pt arrived late to PT today because she was "afraid it was going to rain". She reports she feels as thought her exercising is beneficial and she has been somewhat compliant with her HEP. Patient reports no pain other than wound on R leg.  PT discussed further plan of care with patient and patient agrees that at this point she needs to be more consistant with her HEP and can come to PT at 1x/ week frequency for maintinence and balance training.    Pertinent History  Pt is well known to this clinic, just recently d/c from this clinic on 12/6 after being seen for R shoulder pain.  Pt reports that her balance is not good and finds herself unsteady at times.  Pt has had EMGs performed BUEs and BLEs which were both negative.  Pt reports she does have numbness in L foot up to ankle at all times.  Pt reports that her balance is worse when standing still, improves when she is moving. Goals: would like to be able to  walk through the grass to check on her garden. Pt with h/o ACDF C4-6 on 04/19/17. Pt has a h/o 3 surgeries in her lumbar spine.     Limitations  Standing;Walking    How long can you sit comfortably?  1 hr    How long can you stand comfortably?  10 minutes    How long can you walk comfortably?  2 or 3 blocks    Diagnostic tests  EMG testing BLEs and BUEs which were negative    Patient Stated Goals  To improve her balance, to be able to walk her dog    Currently in Pain?  No/denies    Pain Score  0-No pain    Pain Onset  More than a month ago    Multiple Pain Sites  No         -Nustep 5 min during hx intake -Patient filled out ABC questionnaire (unbillied) and PT explained score and improvements and educated patient on psychological effects (lack of confidence) can feed into the negative feedback loop of not moving as much > increased fall risk -Patient was taken through BERG balance exercises and required multiple seated rest breaks. Patient was able to attempt all measures without AD, but was unable to complete SLS  tasks -Patient was led through MMT of bilat LE in multiple positions demonstrating good strength progree- continues to have deficits in hip ext (but still improving) -Patient participated in 10MWT with use of AD and demonstrated slight  Improvement, but did report feeling more confident -HEP review -Patient was educated on continue strengthening at home and strengthening programs' positive effect on balance, and the future POC                      PT Education - 01/17/18 1441    Education provided  Yes    Education Details  Educated patient on further plan of care (to continue strengthening at home and to come to therapy 1x/week to address balance deficits d/t pt busy schedule, and goal progress)    Person(s) Educated  Patient    Methods  Explanation    Comprehension  Verbalized understanding       PT Short Term Goals - 12/28/17 1546      PT SHORT TERM  GOAL #1   Title  Pt will be independent with HEP for carryover between sessions    Time  2    Period  Weeks    Status  New        PT Long Term Goals - 01/17/18 1402      PT LONG TERM GOAL #1   Title  Pt will improve Berg score to at least 45/56 to demonstrate improved balance    Baseline  44/56    Status  Partially Met      PT LONG TERM GOAL #2   Title  Pt will improve 5xSTS time to at least 15 seconds to demonstrate improved BLE strength and balance    Baseline  13 seconds    Status  Achieved      PT LONG TERM GOAL #3   Title  Pt will demonstrate BLE strength at least 4/5 throughout for improved functional mobility    Baseline  4/5 for all LE strength- except 3+/5 for bilat hip ext and bilat ankle DF/PF 5/5    Time  6    Period  Weeks    Status  On-going      PT LONG TERM GOAL #4   Title  Pt will be able to walk through her grass to check on her garden without LOB for improved QOL    Baseline  Unable     Time  6    Period  Weeks    Status  Revised      PT LONG TERM GOAL #5   Title  Pt will improve 65mT to at least 1.0 m/s for pt to transition to community ambulator    Baseline  0.62    Time  6    Period  Weeks    Status  On-going      PT LONG TERM GOAL #6   Title  Pt will improve ABC scale to at least 30% to demonstrate decreased effect of impaired balance on daily activities    Baseline  47.5    Time  6    Period  Weeks    Status  On-going            Plan - 01/17/18 1446    Clinical Impression Statement  Pt tolerated all reassessment tests and measures well today, needing prolonged rest breaks between sets, but no pain noted. Patient is demonstrating good progress toward goals, especially with strength gains, but continues to struggle with  balance and functional gait and SLS. 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 D/t patient's schedule and progress toward strength goals, PT suggested patient continue robust HEP for strength  and attend PT 1x/week with a focus on balance and functional gait. Patient agreed to this new POC. Patient still reports feeling like she will fall if she is in a crowd and this makes her uneasy.     Rehab Potential  Good    PT Frequency  2x / week    PT Duration  6 weeks    PT Treatment/Interventions  ADLs/Self Care Home Management;Aquatic Therapy;Biofeedback;Cryotherapy;Electrical Stimulation;Iontophoresis 51m/ml Dexamethasone;Moist Heat;Ultrasound;Parrafin;Fluidtherapy;Contrast Bath;DME Instruction;Gait training;Stair training;Functional mobility training;Therapeutic activities;Therapeutic exercise;Balance training;Neuromuscular re-education;Patient/family education;Orthotic Fit/Training;Manual techniques;Passive range of motion;Dry needling;Energy conservation;Taping    PT Next Visit Plan  Transition to balance and functional gait focus    PT Home Exercise Plan  Sit<>stand, standing hip ext and abd, bridging, and heel raises    Consulted and Agree with Plan of Care  Patient       Patient will benefit from skilled therapeutic intervention in order to improve the following deficits and impairments:  Abnormal gait, Decreased activity tolerance, Decreased balance, Decreased endurance, Decreased knowledge of use of DME, Decreased mobility, Decreased range of motion, Decreased safety awareness, Decreased strength, Difficulty walking, Impaired perceived functional ability, Impaired flexibility, Impaired sensation, Impaired UE functional use, Improper body mechanics, Postural dysfunction  Visit Diagnosis: Unsteadiness on feet     Problem List Patient Active Problem List   Diagnosis Date Noted  . Lymphedema 10/11/2017  . Chronic venous insufficiency 10/11/2017  . Leg pain 10/11/2017  . Leg swelling 10/11/2017  . COPD (chronic obstructive pulmonary disease) ([place] 10/11/2017  . Essential hypertension 10/11/2017   CShelton SilvasPT, DPT CShelton Silvas2/05/2018, 3:07 PM  [facility] [address] [place] [address]Phone: [phone]  Fax:  [phone] Name: JMALEE GRAYSMRN: [id number] of Birth: 131-Jan-1931

## 2018-01-17 NOTE — Addendum Note (Signed)
Addended by: Staci Acosta on: 01/17/2018 03:37 PM   Modules accepted: Orders

## 2018-01-17 NOTE — Progress Notes (Addendum)
Natasha, Barnett ([id number]) Visit Report for 01/16/2018 Arrival Information Details Patient Name: [name], [name]. Date of Service: 01/16/2018 1:45 PM Medical Record Number: [id number] Patient Account Number: [id number] Date of Birth/Sex: [birth year] (84 y.o. Female) Treating RN: Renne Crigler Primary Care Neka Bise: Aram Beecham Other Clinician: Referring Tjay Velazquez: Aram Beecham Treating Demaris Bousquet/Extender: Linwood Dibbles, HOYT Weeks in Treatment: 9 Visit Information History Since Last Visit All ordered tests and consults were completed: No Patient Arrived: Gilmer Mor Added or deleted any medications: No Arrival Time: 13:58 Had a fall or experienced change in No Accompanied By: self activities of daily living that may affect Transfer Assistance: None risk of falls: Patient Identification Verified: Yes Signs or symptoms of abuse/neglect since last visito No Secondary Verification Process Completed: Yes Pain Present Now: Yes Patient Requires Transmission-Based Precautions: No Patient Has Alerts: No Electronic Signature(s) Signed: 01/16/2018 2:14:47 PM By: Renne Crigler Entered By: Renne Crigler on 01/16/2018 13:58:58 [name] ([id number]) -------------------------------------------------------------------------------- Encounter Discharge Information Details Patient Name: [name] Date of Service: 01/16/2018 1:45 PM Medical Record Number: [id number] Patient Account Number: [id number] Date of Birth/Sex: May 15, 1930 (84 y.o. Female) Treating RN: Renne Crigler Primary Care Nicolasa Milbrath: Aram Beecham Other Clinician: Referring Emer Onnen: Aram Beecham Treating Cougar Imel/Extender: Linwood Dibbles, HOYT Weeks in Treatment: 9 Encounter Discharge Information Items Discharge Pain Level: 0 Discharge Condition: Stable Ambulatory Status: Cane Discharge Destination: Home Transportation: Private Auto Accompanied By: self Schedule Follow-up Appointment: Yes Medication Reconciliation  completed and No provided to Patient/Care Ceara Wrightson: Provided on Clinical Summary of Care: 01/16/2018 Form Type Recipient Paper Patient [facility] Electronic Signature(s) Signed: 01/17/2018 9:47:07 AM By: Gwenlyn Perking Entered By: Gwenlyn Perking on 01/16/2018 14:52:18 [name] ([id number]) -------------------------------------------------------------------------------- Lower Extremity Assessment Details Patient Name: [name] Date of Service: 01/16/2018 1:45 PM Medical Record Number: [id number] Patient Account Number: [id number] Date of Birth/Sex: [birth year] (84 y.o. Female) Treating RN: Renne Crigler Primary Care Ebonique Hallstrom: Aram Beecham Other Clinician: Referring Kynzlee Hucker: Aram Beecham Treating Donnelle Rubey/Extender: Linwood Dibbles, HOYT Weeks in Treatment: 9 Edema Assessment Assessed: [Left: No] [Right: No] Edema: [Left: N] [Right: o] Vascular Assessment Claudication: Claudication Assessment [Right:None] Pulses: Dorsalis Pedis Palpable: [Right:Yes] Posterior Tibial Extremity colors, hair growth, and conditions: Extremity Color: [Right:Hyperpigmented] Hair Growth on Extremity: [Right:Yes] Temperature of Extremity: [Right:Cool] Capillary Refill: [Right:< 3 seconds] Toe Nail Assessment Left: Right: Thick: No Discolored: No Deformed: No Improper Length and Hygiene: No Electronic Signature(s) Signed: 01/16/2018 2:14:47 PM By: Renne Crigler Entered By: Renne Crigler on 01/16/2018 14:07:01 [name] ([id number]) -------------------------------------------------------------------------------- Multi Wound Chart Details Patient Name: [name] Date of Service: 01/16/2018 1:45 PM Medical Record Number: [id number] Patient Account Number: [id number] Date of Birth/Sex: 10-04-1930 (84 y.o. Female) Treating RN: Renne Crigler Primary Care Sophronia Varney: Aram Beecham Other Clinician: Referring Alexxia Stankiewicz: Aram Beecham Treating Omega Durante/Extender: Linwood Dibbles,  HOYT Weeks in Treatment: 9 Vital Signs Height(in): 60 Pulse(bpm): 68 Weight(lbs): 123 Blood Pressure(mmHg): 120/48 Body Mass Index(BMI): 24 Temperature(F): 98.1 Respiratory Rate 16 (breaths/min): Photos: [5:No Photos] [N/A:N/A] Wound Location: [5:Right Lower Leg - Anterior] [N/A:N/A] Wounding Event: [5:Trauma] [N/A:N/A] Primary Etiology: [5:Trauma, Other] [N/A:N/A] Comorbid History: [5:Hypertension, Gout, Osteoarthritis, Neuropathy] [N/A:N/A] Date Acquired: [5:09/05/2017] [N/A:N/A] Weeks of Treatment: [5:9] [N/A:N/A] Wound Status: [5:Open] [N/A:N/A] Measurements L x W x D [5:0.9x1x0.2] [N/A:N/A] (cm) Area (cm) : [5:0.707] [N/A:N/A] Volume (cm) : [5:0.141] [N/A:N/A] % Reduction in Area: [5:46.40%] [N/A:N/A] % Reduction in Volume: [5:-6.80%] [N/A:N/A] Classification: [5:Full Thickness With Exposed Support Structures] [N/A:N/A] Exudate Amount: [5:Medium] [N/A:N/A] Exudate Type: [5:Serous] [N/A:N/A] Exudate Color: [5:amber] [N/A:N/A] Wound Margin: [5:Distinct, outline attached] [  N/A:N/A] Granulation Amount: [5:Small (1-33%)] [N/A:N/A] Granulation Quality: [5:Red] [N/A:N/A] Necrotic Amount: [5:Large (67-100%)] [N/A:N/A] Exposed Structures: [5:Fat Layer (Subcutaneous Tissue) Exposed: Yes Fascia: No Tendon: No Muscle: No Joint: No Bone: No] [N/A:N/A] Epithelialization: [5:Small (1-33%)] [N/A:N/A] Periwound Skin Texture: [5:Induration: Yes Scarring: Yes Excoriation: No Callus: No] [N/A:N/A] Crepitus: No Rash: No Periwound Skin Moisture: Maceration: No N/A N/A Dry/Scaly: No Periwound Skin Color: Atrophie Blanche: No N/A N/A Cyanosis: No Ecchymosis: No Erythema: No Hemosiderin Staining: No Mottled: No Pallor: No Rubor: No Temperature: No Abnormality N/A N/A Tenderness on Palpation: Yes N/A N/A Wound Preparation: Ulcer Cleansing: N/A N/A Rinsed/Irrigated with Saline Topical Anesthetic Applied: Other: lidocaine 4% Treatment Notes Electronic Signature(s) Signed:  01/16/2018 2:14:47 PM By: Renne Crigler Entered By: Renne Crigler on 01/16/2018 14:07:22 [name] ([id number]) -------------------------------------------------------------------------------- Multi-Disciplinary Care Plan Details Patient Name: [name] Date of Service: 01/16/2018 1:45 PM Medical Record Number: [id number] Patient Account Number: [id number] Date of Birth/Sex: [birth year] (84 y.o. Female) Treating RN: Renne Crigler Primary Care Shaun Zuccaro: Aram Beecham Other Clinician: Referring Samrat Hayward: Aram Beecham Treating Shourya Macpherson/Extender: Linwood Dibbles, HOYT Weeks in Treatment: 9 Active Inactive ` Orientation to the Wound Care Program Nursing Diagnoses: Knowledge deficit related to the wound healing center program Goals: Patient/caregiver will verbalize understanding of the Wound Healing Center Program Date Initiated: 11/14/2017 Target Resolution Date: 11/28/2017 Goal Status: Active Interventions: Provide education on orientation to the wound center Notes: ` Wound/Skin Impairment Nursing Diagnoses: Impaired tissue integrity Knowledge deficit related to ulceration/compromised skin integrity Goals: Patient/caregiver will verbalize understanding of skin care regimen Date Initiated: 11/14/2017 Target Resolution Date: 11/28/2017 Goal Status: Active Ulcer/skin breakdown will have a volume reduction of 30% by week 4 Date Initiated: 11/14/2017 Target Resolution Date: 11/28/2017 Goal Status: Active Interventions: Assess patient/caregiver ability to obtain necessary supplies Assess patient/caregiver ability to perform ulcer/skin care regimen upon admission and as needed Assess ulceration(s) every visit Treatment Activities: Skin care regimen initiated : 11/14/2017 Notes: Electronic Signature(s) Signed: 01/16/2018 2:14:47 PM By: Lenore Cordia, Lawernce Keas ([id number]) Entered By: Renne Crigler on 01/16/2018 14:07:09 [name]  ([id number]) -------------------------------------------------------------------------------- Pain Assessment Details Patient Name: [name] Date of Service: 01/16/2018 1:45 PM Medical Record Number: [id number] Patient Account Number: [id number] Date of Birth/Sex: 1930-12-01 (84 y.o. Female) Treating RN: Renne Crigler Primary Care Darianny Momon: Aram Beecham Other Clinician: Referring Kory Panjwani: Aram Beecham Treating Tuvia Woodrick/Extender: Linwood Dibbles, HOYT Weeks in Treatment: 9 Active Problems Location of Pain Severity and Description of Pain Patient Has Paino Patient Unable to Respond Site Locations Duration of the Pain. Constant / Intermittento Constant Rate the pain. Current Pain Level: 7 Character of Pain Describe the Pain: Aching Pain Management and Medication Current Pain Management: Notes states very sensitive to touch Electronic Signature(s) Signed: 01/16/2018 2:14:47 PM By: Renne Crigler Entered By: Renne Crigler on 01/16/2018 13:59:34 [name] ([id number]) -------------------------------------------------------------------------------- Patient/Caregiver Education Details Patient Name: [name] Date of Service: 01/16/2018 1:45 PM Medical Record Number: [id number] Patient Account Number: [id number] Date of Birth/Gender: [birth year] (84 y.o. Female) Treating RN: Renne Crigler Primary Care Physician: Aram Beecham Other Clinician: Referring Physician: Aram Beecham Treating Physician/Extender: Skeet Simmer in Treatment: 9 Education Assessment Education Provided To: Patient Education Topics Provided Wound Debridement: Methods: Explain/Verbal Responses: State content correctly Wound/Skin Impairment: Handouts: Caring for Your Ulcer Methods: Explain/Verbal Responses: State content correctly Electronic Signature(s) Signed: 01/16/2018 4:48:53 PM By: Renne Crigler Entered By: Renne Crigler on 01/16/2018 14:51:05 [name]  ([id number]) -------------------------------------------------------------------------------- Wound Assessment Details Patient Name: [name] Date of  Service: 01/16/2018 1:45 PM Medical Record Number: [id number] Patient Account Number: [id number] Date of Birth/Sex: February 21, 1930 (84 y.o. Female) Treating RN: Renne Crigler Primary Care Jermia Rigsby: Aram Beecham Other Clinician: Referring Karley Pho: Aram Beecham Treating Levi Crass/Extender: Linwood Dibbles, HOYT Weeks in Treatment: 9 Wound Status Wound Number: 5 Primary Etiology: Trauma, Other Wound Location: Right Lower Leg - Anterior Wound Status: Open Wounding Event: Trauma Comorbid Hypertension, Gout, Osteoarthritis, History: Neuropathy Date Acquired: 09/05/2017 Weeks Of Treatment: 9 Clustered Wound: No Photos Photo Uploaded By: Renne Crigler on 01/16/2018 16:42:29 Wound Measurements Length: (cm) 0.9 Width: (cm) 1 Depth: (cm) 0.2 Area: (cm) 0.707 Volume: (cm) 0.141 % Reduction in Area: 46.4% % Reduction in Volume: -6.8% Epithelialization: Small (1-33%) Tunneling: No Undermining: No Wound Description Full Thickness With Exposed Support Foul Classification: Structures Slou Wound Margin: Distinct, outline attached Exudate Medium Amount: Exudate Type: Serous Exudate Color: amber Odor After Cleansing: No gh/Fibrino Yes Wound Bed Granulation Amount: Small (1-33%) Exposed Structure Granulation Quality: Red Fascia Exposed: No Necrotic Amount: Large (67-100%) Fat Layer (Subcutaneous Tissue) Exposed: Yes Necrotic Quality: Adherent Slough Tendon Exposed: No Muscle Exposed: No Joint Exposed: No Bone Exposed: No TEMA, ALIRE V. ([id number]) Periwound Skin Texture Texture Color No Abnormalities Noted: No No Abnormalities Noted: No Callus: No Atrophie Blanche: No Crepitus: No Cyanosis: No Excoriation: No Ecchymosis: No Induration: Yes Erythema: No Rash: No Hemosiderin Staining: No Scarring:  Yes Mottled: No Pallor: No Moisture Rubor: No No Abnormalities Noted: No Dry / Scaly: No Temperature / Pain Maceration: No Temperature: No Abnormality Tenderness on Palpation: Yes Wound Preparation Ulcer Cleansing: Rinsed/Irrigated with Saline Topical Anesthetic Applied: Other: lidocaine 4%, Treatment Notes Wound #5 (Right, Anterior Lower Leg) 1. Cleansed with: Clean wound with Normal Saline 2. Anesthetic Topical Lidocaine 4% cream to wound bed prior to debridement Hurricaine Topical Anesthetic Spray 4. Dressing Applied: Prisma Ag 5. Secondary Dressing Applied Bordered Foam Dressing Electronic Signature(s) Signed: 01/16/2018 2:14:47 PM By: Renne Crigler Entered By: Renne Crigler on 01/16/2018 14:05:53 [name] ([id number]) -------------------------------------------------------------------------------- Vitals Details Patient Name: [name] Date of Service: 01/16/2018 1:45 PM Medical Record Number: [id number] Patient Account Number: [id number] Date of Birth/Sex: 1930-02-20 (84 y.o. Female) Treating RN: Renne Crigler Primary Care Wynelle Dreier: Aram Beecham Other Clinician: Referring Eldwin Volkov: Aram Beecham Treating Neita Landrigan/Extender: Linwood Dibbles, HOYT Weeks in Treatment: 9 Vital Signs Time Taken: 01:59 Temperature (F): 98.1 Height (in): 60 Pulse (bpm): 68 Weight (lbs): 123 Respiratory Rate (breaths/min): 16 Body Mass Index (BMI): 24 Blood Pressure (mmHg): 120/48 Reference Range: 80 - 120 mg / dl Electronic Signature(s) Signed: 01/16/2018 2:14:47 PM By: Renne Crigler Entered By: Renne Crigler on 01/16/2018 14:01:49

## 2018-01-18 NOTE — Progress Notes (Signed)
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 Crohnos; Hepatitis A; Hepatitis B; Hepatitis C Endocrine Medical History: Negative for: Type I Diabetes; Type II Diabetes Genitourinary Medical History: Negative for: End Stage Renal Disease Immunological Medical History: Negative for: Lupus Erythematosus; Raynaudos; Scleroderma Musculoskeletal Medical History: Positive for: Gout; Osteoarthritis Negative for: Rheumatoid Arthritis; Osteomyelitis Neurologic Medical History: Positive for: Neuropathy - lower legs Negative  for: Dementia; Quadriplegia; Paraplegia; Seizure Disorder Oncologic Medical History: Negative for: Received Chemotherapy; Received Radiation Psychiatric Complaints and Symptoms: No Complaints or Symptoms Medical History: Negative for: Anorexia/bulimia; Confinement  Anxiety ELIDE, STALZER ([id number]) Immunizations Pneumococcal Vaccine: Received Pneumococcal Vaccination: Yes Implantable Devices Family and Social History Cancer: Yes - Mother,Siblings; Diabetes: Yes - Father; Heart Disease: Yes - Father,Siblings; Hereditary Spherocytosis: No; Hypertension: Yes - Father,Siblings; Kidney Disease: No; Lung Disease: Yes - Siblings; Seizures: No; Stroke: No; Thyroid Problems: No; Tuberculosis: No; Never smoker; Marital Status - Married; Alcohol Use: Never; Drug Use: No History; Caffeine Use: Never; Financial Concerns: No; Food, Clothing or Shelter Needs: No; Support System Lacking: No; Transportation Concerns: No; Advanced Directives: No; Patient does not want information on Advanced Directives; Medical Power of Attorney: Yes - Lakishia Bourassa (Copy provided) Physician Affirmation I have reviewed and agree with the above information. Electronic Signature(s) Signed: 01/16/2018 7:27:36 PM By: Lenda Kelp PA-C Signed: 01/17/2018 4:36:59 PM By: Renne Crigler Entered By: Lenda Kelp on 01/16/2018 18:40:42 [name] ([id number]) -------------------------------------------------------------------------------- SuperBill Details Patient Name: [name] Date of Service: 01/16/2018 Medical Record Number: [id number] Patient Account Number: [id number] Date of Birth/Sex: Sep 16, 1930 (84 y.o. Female) Treating RN: Renne Crigler Primary Care Provider: Aram Beecham Other Clinician: Referring Provider: Aram Beecham Treating Provider/Extender: Linwood Dibbles, HOYT Weeks in Treatment: 9 Diagnosis Coding ICD-10 Codes Code Description S81.811S Laceration without foreign body, right lower leg, sequela L97.812 Non-pressure chronic ulcer of other part of right lower leg with fat layer exposed Facility Procedures CPT4 Code Description: [id number] 11042 - DEB SUBQ TISSUE 20 SQ CM/< ICD-10 Diagnosis Description L97.812 Non-pressure chronic ulcer of other part of  right lower leg wit Modifier: h fat layer expo Quantity: 1 sed Physician Procedures CPT4 Code Description: [id number] 11042 - WC PHYS SUBQ TISS 20 SQ CM ICD-10 Diagnosis Description L97.812 Non-pressure chronic ulcer of other part of right lower leg wit Modifier: h fat layer expo Quantity: 1 sed Electronic Signature(s) Signed: 01/16/2018 7:27:36 PM By: Lenda Kelp PA-C Entered By: Lenda Kelp on 01/16/2018 18:43:51

## 2018-01-23 ENCOUNTER — Ambulatory Visit: Payer: Medicare Other | Admitting: Physical Therapy

## 2018-01-25 ENCOUNTER — Encounter: Payer: Medicare Other | Admitting: Physical Therapy

## 2018-01-25 ENCOUNTER — Ambulatory Visit: Payer: Medicare Other | Admitting: Nurse Practitioner

## 2018-01-30 ENCOUNTER — Encounter: Payer: Medicare Other | Admitting: Physician Assistant

## 2018-01-30 ENCOUNTER — Ambulatory Visit: Payer: Medicare Other | Admitting: Physical Therapy

## 2018-01-30 DIAGNOSIS — L97812 Non-pressure chronic ulcer of other part of right lower leg with fat layer exposed: Secondary | ICD-10-CM | POA: Diagnosis not present

## 2018-01-31 NOTE — Progress Notes (Signed)
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 prescribed: lidocaine topical 4 % cream 1 1 cream topical was prescribed at facility At this point I'm going to suggest that for the upper/posterior leg ulcer we used Xeroform gauze dressing although we will use of petroleum gauze today since we do not have a Xeroform in stock. Subsequently I do believe this will likely resolve quite nicely will cover this with a Boarder Foam Dressing for protection. Otherwise we will continue with the Prisma for the left anterior lower extremity ulcer. We will see were things stand in one weeks time. Hopefully she'll be doing much better in both scenarios. Please see above for specific wound care orders. We will see patient for re-evaluation in 1 week(s) here in the clinic. If anything worsens or changes patient will contact our office for additional recommendations. [name], [name] ([id number]) Electronic Signature(s) Signed: 01/30/2018 5:27:42 PM By: Lenda Kelp PA-C Entered  By: Lenda Kelp on 01/30/2018 15:37:29 [name] ([id number]) -------------------------------------------------------------------------------- ROS/PFSH Details Patient Name: [name] Date of Service: 01/30/2018 2:30 PM Medical Record Number: [id number] Patient Account Number: [id number] Date of Birth/Sex: 1930/06/17 (84 y.o. Female) Treating RN: Ashok Cordia, Debi Primary Care Provider: Aram Beecham Other Clinician: Referring Provider: Aram Beecham Treating Provider/Extender: Linwood Dibbles, Tobie Perdue Weeks in Treatment: 11 Information Obtained From Patient Wound History Do you currently have one or more open woundso Yes How many open wounds do you currently haveo 1 Approximately how long have you had your woundso 10 weeks How have you been treating your wound(s) until nowo mupirocin Has your wound(s) ever healed and then re-openedo No Have you had any lab work done in the past montho No Have you tested positive for an antibiotic resistant organism (MRSA, VRE)o No Have you tested positive for osteomyelitis (bone infection)o No Have you had any tests for circulation on your legso Yes Who ordered the testo dr schnier [facility] Have you had other problems associated with your woundso Swelling Constitutional Symptoms (General Health) Complaints and Symptoms: Negative for: Fever; Chills Medical History: Past Medical History Notes: Back pain Eyes Medical History: Negative for: Cataracts; Glaucoma; Optic Neuritis Ear/Nose/Mouth/Throat Medical History: Negative for: Chronic sinus problems/congestion; Middle ear problems Past Medical History Notes: bilateral hearing aides Hematologic/Lymphatic Medical History: Negative for: Anemia; Hemophilia; Human Immunodeficiency Virus; Lymphedema; Sickle Cell Disease Respiratory Complaints and Symptoms: No Complaints or Symptoms Medical History: Negative for: Aspiration; Asthma; Chronic Obstructive Pulmonary  Disease (COPD); Pneumothorax; Sleep Apnea; [name], [name] ([id number]) Tuberculosis Cardiovascular Complaints and Symptoms: No Complaints or Symptoms Medical History: Positive for: Hypertension Negative for: Angina; Arrhythmia; Congestive Heart Failure; Coronary Artery Disease; Deep Vein Thrombosis; Hypotension; Myocardial Infarction; Peripheral Arterial Disease; Peripheral Venous Disease; Phlebitis; Vasculitis Past Medical History Notes: pacemaker Gastrointestinal Medical History: Negative for: Cirrhosis ; Colitis; Crohnos; Hepatitis A; Hepatitis B; Hepatitis C Endocrine Medical History: Negative for: Type I Diabetes; Type II Diabetes Genitourinary Medical History: Negative for: End Stage Renal Disease Immunological Medical History: Negative for: Lupus Erythematosus; Raynaudos; Scleroderma Musculoskeletal Medical History: Positive for: Gout; Osteoarthritis Negative for: Rheumatoid Arthritis; Osteomyelitis Neurologic Medical History: Positive for: Neuropathy - lower legs Negative for: Dementia; Quadriplegia; Paraplegia; Seizure Disorder Oncologic Medical History: Negative for: Received Chemotherapy; Received Radiation Psychiatric Complaints and  Symptoms: No Complaints or Symptoms Medical History: Negative for: Anorexia/bulimia; Confinement Anxiety [name], [name] ([id number]) Immunizations Pneumococcal Vaccine: Received Pneumococcal Vaccination: Yes Implantable Devices Family and Social History Cancer: Yes - Mother,Siblings; Diabetes: Yes - Father; Heart Disease: Yes - Father,Siblings; Hereditary Spherocytosis: No; Hypertension: Yes - Father,Siblings; Kidney Disease: No; Lung Disease: Yes - Siblings; Seizures: No; Stroke: No; Thyroid Problems: No; Tuberculosis: No; Never smoker; Marital Status - Married; Alcohol Use: Never; Drug Use: No History; Caffeine Use: Never; Financial Concerns: No; Food, Clothing or Shelter Needs: No; Support System Lacking: No;  Transportation Concerns: No; Advanced Directives: No; Patient does not want information on Advanced Directives; Medical Power of Attorney: Yes - Sonnie Bias (Copy provided) Physician Affirmation I have reviewed and agree with the above information. Electronic Signature(s) Signed: 01/30/2018 4:06:48 PM By: Alejandro Mulling Signed: 01/30/2018 5:27:42 PM By: Lenda Kelp PA-C Entered By: Lenda Kelp on 01/30/2018 15:35:52 [name] ([id number]) -------------------------------------------------------------------------------- SuperBill Details Patient Name: [name] Date of Service: 01/30/2018 Medical Record Number: [id number] Patient Account Number: [id number] Date of Birth/Sex: 1930-05-19 (84 y.o. Female) Treating RN: Ashok Cordia, Debi Primary Care Provider: Aram Beecham Other Clinician: Referring Provider: Aram Beecham Treating Provider/Extender: Linwood Dibbles, Anderson [place]brooks Weeks in Treatment: 11 Diagnosis Coding ICD-10 Codes Code Description S81.811S Laceration without foreign body, right lower leg, sequela L97.812 Non-pressure chronic ulcer of other part of right lower leg with fat layer exposed Facility Procedures CPT4 Code: [id number] Description: 99213 - WOUND CARE VISIT-LEV 3 EST PT Modifier: Quantity: 1 Physician Procedures CPT4 Code Description: [id number] 99213 - WC PHYS LEVEL 3 - EST PT ICD-10 Diagnosis Description S81.811S Laceration without foreign body, right lower leg, sequela L97.812 Non-pressure chronic ulcer of other part of right lower leg wi Modifier: th fat layer expo Quantity: 1 sed Electronic Signature(s) Signed: 01/30/2018 5:27:42 PM By: Lenda Kelp PA-C Entered By: Lenda Kelp on 01/30/2018 15:37:48

## 2018-02-01 ENCOUNTER — Ambulatory Visit: Payer: Medicare Other | Admitting: Physical Therapy

## 2018-02-01 NOTE — Progress Notes (Signed)
[name], [name] ([id number]) Visit Report for 01/30/2018 Arrival Information Details Patient Name: [name], [name] Date of Service: 01/30/2018 2:30 PM Medical Record Number: [id number] Patient Account Number: [id number] Date of Birth/Sex: [birth year] (84 y.o. Female) Treating RN: Curtis Sites Primary Care [name]: [name] Other Clinician: Referring [name]: [name] Treating Anush Wiedeman/Extender: [name], [name] in Treatment: 11 Visit Information History Since Last Visit Added or deleted any medications: No Patient Arrived: Cane Any new allergies or adverse reactions: No Arrival Time: 14:30 Had a fall or experienced change in No Accompanied By: self activities of daily living that may affect Transfer Assistance: None risk of falls: Patient Identification Verified: Yes Signs or symptoms of abuse/neglect since last visito No Secondary Verification Process Completed: Yes Hospitalized since last visit: No Patient Requires Transmission-Based Precautions: No Has Dressing in Place as Prescribed: Yes Patient Has Alerts: No Pain Present Now: No Electronic Signature(s) Signed: 01/30/2018 4:30:06 PM By: Curtis Sites Entered By: Curtis Sites on 01/30/2018 14:31:00 [name] ([id number]) -------------------------------------------------------------------------------- Clinic Level of Care Assessment Details Patient Name: [name] Date of Service: 01/30/2018 2:30 PM Medical Record Number: [id number] Patient Account Number: [id number] Date of Birth/Sex: Apr 08, 1930 (84 y.o. Female) Treating RN: [name], Debi Primary Care Dawud Mays: [name] Other Clinician: Referring Azim Gillingham: [name] Treating Merrick Maggio/Extender: [name], [name] in Treatment: 11 Clinic Level of Care Assessment Items TOOL 4 Quantity Score X - Use when only an EandM is performed on FOLLOW-UP visit 1 0 ASSESSMENTS - Nursing Assessment / Reassessment X - Reassessment of  Co-morbidities (includes updates in patient status) 1 10 X- 1 5 Reassessment of Adherence to Treatment Plan ASSESSMENTS - Wound and Skin Assessment / Reassessment []  - Simple Wound Assessment / Reassessment - one wound 0 X- 2 5 Complex Wound Assessment / Reassessment - multiple wounds []  - 0 Dermatologic / Skin Assessment (not related to wound area) ASSESSMENTS - Focused Assessment []  - Circumferential Edema Measurements - multi extremities 0 []  - 0 Nutritional Assessment / Counseling / Intervention []  - 0 Lower Extremity Assessment (monofilament, tuning fork, pulses) []  - 0 Peripheral Arterial Disease Assessment (using hand held doppler) ASSESSMENTS - Ostomy and/or Continence Assessment and Care []  - Incontinence Assessment and Management 0 []  - 0 Ostomy Care Assessment and Management (repouching, etc.) PROCESS - Coordination of Care X - Simple Patient / Family Education for ongoing care 1 15 []  - 0 Complex (extensive) Patient / Family Education for ongoing care []  - 0 Staff obtains Chiropractor, Records, Test Results / Process Orders []  - 0 Staff telephones HHA, Nursing Homes / Clarify orders / etc []  - 0 Routine Transfer to another Facility (non-emergent condition) []  - 0 Routine Hospital Admission (non-emergent condition) []  - 0 New Admissions / Manufacturing engineer / Ordering NPWT, Apligraf, etc. []  - 0 Emergency Hospital Admission (emergent condition) X- 1 10 Simple Discharge Coordination [name], [name] ([id number]) []  - 0 Complex (extensive) Discharge Coordination PROCESS - Special Needs []  - Pediatric / Minor Patient Management 0 []  - 0 Isolation Patient Management []  - 0 Hearing / Language / Visual special needs []  - 0 Assessment of Community assistance (transportation, D/C planning, etc.) []  - 0 Additional assistance / Altered mentation []  - 0 Support Surface(s) Assessment (bed, cushion, seat, etc.) INTERVENTIONS - Wound Cleansing / Measurement []  -  Simple Wound Cleansing - one wound 0 X- 2 5 Complex Wound Cleansing - multiple wounds X- 1 5 Wound Imaging (photographs - any number of wounds) []  - 0 Wound Tracing (  instead of photographs) []  - 0 Simple Wound Measurement - one wound X- 2 5 Complex Wound Measurement - multiple wounds INTERVENTIONS - Wound Dressings X - Small Wound Dressing one or multiple wounds 2 10 []  - 0 Medium Wound Dressing one or multiple wounds []  - 0 Large Wound Dressing one or multiple wounds X- 1 5 Application of Medications - topical []  - 0 Application of Medications - injection INTERVENTIONS - Miscellaneous []  - External ear exam 0 []  - 0 Specimen Collection (cultures, biopsies, blood, body fluids, etc.) []  - 0 Specimen(s) / Culture(s) sent or taken to Lab for analysis []  - 0 Patient Transfer (multiple staff / / Similar devices) []  - 0 Simple Staple / Suture removal (25 or less) []  - 0 Complex Staple / Suture removal (26 or more) []  - 0 Hypo / Hyperglycemic Management (close monitor of Blood Glucose) []  - 0 Ankle / Brachial Index (ABI) - do not check if billed separately X- 1 5 Vital Signs [name], [name] ( ) Has the patient been seen at the hospital within the last three years: Yes Total Score: 105 Level Of Care: New/Established - Level 3 Electronic Signature(s) Signed: 01/30/2018 4:06:48 PM By: Entered By: on 01/30/2018 15:26:47 (Nurse, adult) -------------------------------------------------------------------------------- Encounter Discharge Information Details Patient Name: Date of Service: 01/30/2018 2:30 PM Medical Record Number: Patient Account Number: Date of Birth/Sex: 29-Apr-1930 (84 y.o. Female) Treating RN: 02/01/2018, Debi Primary Care Imaan Padgett: Alejandro Mulling Other Clinician: Referring Darran Gabay: Alejandro Mulling Treating Mardie Kellen/Extender: 02/01/2018, [name] in  Treatment: 11 Encounter Discharge Information Items Discharge Pain Level: 0 Discharge Condition: Stable Ambulatory Status: Cane Discharge Destination: Home Transportation: Private Auto Accompanied By: friend Schedule Follow-up Appointment: Yes Medication Reconciliation completed and No provided to Patient/Care Alianna Wurster: Provided on Clinical Summary of Care: 01/30/2018 Form Type Recipient Paper Patient [facility] Electronic Signature(s) Signed: 01/31/2018 9:41:33 AM By: 02/01/2018 Entered By: [id number] on 01/30/2018 15:23:43 [name] ([id number]) -------------------------------------------------------------------------------- Lower Extremity Assessment Details Patient Name: [name] Date of Service: 01/30/2018 2:30 PM Medical Record Number: [name] Patient Account Number: [name] Date of Birth/Sex: [birth year] (84 y.o. Female) Treating RN: 02/02/2018 Primary Care Alenna Russell: Renne Crigler Other Clinician: Referring Deyja Sochacki: Renne Crigler Treating Kortlyn Koltz/Extender: 02/01/2018, [name] in Treatment: 11 Vascular Assessment Pulses: Dorsalis Pedis Palpable: [Right:Yes] Posterior Tibial Extremity colors, hair growth, and conditions: Extremity Color: [Right:Mottled] Hair Growth on Extremity: [Right:No] Temperature of Extremity: [Right:Warm] Capillary Refill: [Right:< 3 seconds] Electronic Signature(s) Signed: 01/30/2018 4:30:06 PM By: [id number] Entered By: [name] on 01/30/2018 14:35:43 [id number] ([id number]) -------------------------------------------------------------------------------- Multi Wound Chart Details Patient Name: [name] Date of Service: 01/30/2018 2:30 PM Medical Record Number: Curtis Sites Patient Account Number: [name] Date of Birth/Sex: October 01, 1930 (84 y.o. Female) Treating RN: 02/01/2018, Debi Primary Care Millard Bautch: Curtis Sites Other Clinician: Referring Jodee Wagenaar: Curtis Sites Treating  Kyndall Chaplin/Extender: 02/01/2018, [name] in Treatment: 11 Vital Signs Height(in): 60 Pulse(bpm): 67 Weight(lbs): 123 Blood Pressure(mmHg): 140/61 Body Mass Index(BMI): 24 Temperature(F): 97.8 Respiratory Rate 16 (breaths/min): Photos: [5:No Photos] [6:No Photos] [N/A:N/A] Wound Location: [5:Right Lower Leg - Anterior] [6:Right Lower Leg - Posterior] [N/A:N/A] Wounding Event: [5:Trauma] [6:Trauma] [N/A:N/A] Primary Etiology: [5:Trauma, Other] [6:Skin Tear] [N/A:N/A] Comorbid History: [5:Hypertension, Gout, Osteoarthritis, Neuropathy] [6:Hypertension, Gout, Osteoarthritis, Neuropathy] [N/A:N/A] Date Acquired: [5:09/05/2017] [6:01/26/2018] [N/A:N/A] Barnett of Treatment: [5:11] [6:0] [N/A:N/A] Wound Status: [5:Open] [6:Open] [N/A:N/A] Measurements L x W x D [5:0.9x0.9x0.1] [6:2.4x1.6x0.1] [N/A:N/A] (cm) Area (cm) : [  5:0.636] [6:3.016] [N/A:N/A] Volume (cm) : [5:0.064] [6:0.302] [N/A:N/A] % Reduction in Area: [5:51.80%] [6:N/A] [N/A:N/A] % Reduction in Volume: [5:51.50%] [6:N/A] [N/A:N/A] Classification: [5:Full Thickness With Exposed Support Structures] [6:Partial Thickness] [N/A:N/A] Exudate Amount: [5:Medium] [6:Medium] [N/A:N/A] Exudate Type: [5:Serous] [6:Serous] [N/A:N/A] Exudate Color: [5:amber] [6:amber] [N/A:N/A] Wound Margin: [5:Distinct, outline attached] [6:Flat and Intact] [N/A:N/A] Granulation Amount: [5:Medium (34-66%)] [6:Large (67-100%)] [N/A:N/A] Granulation Quality: [5:Red] [6:Red] [N/A:N/A] Necrotic Amount: [5:Medium (34-66%)] [6:None Present (0%)] [N/A:N/A] Exposed Structures: [5:Fat Layer (Subcutaneous Tissue) Exposed: Yes Fascia: No Tendon: No Muscle: No Joint: No Bone: No] [6:Fascia: No Fat Layer (Subcutaneous Tissue) Exposed: No Tendon: No Muscle: No Joint: No Bone: No] [N/A:N/A] Epithelialization: [5:Small (1-33%)] [6:Medium (34-66%)] [N/A:N/A] Periwound Skin Texture: [5:Induration: Yes Scarring: Yes Excoriation: No Callus: No] [6:Excoriation: No  Induration: No Callus: No Crepitus: No] [N/A:N/A] Crepitus: No Rash: No Rash: No Scarring: No Periwound Skin Moisture: Maceration: No Maceration: No N/A Dry/Scaly: No Dry/Scaly: No Periwound Skin Color: Atrophie Blanche: No Atrophie Blanche: No N/A Cyanosis: No Cyanosis: No Ecchymosis: No Ecchymosis: No Erythema: No Erythema: No Hemosiderin Staining: No Hemosiderin Staining: No Mottled: No Mottled: No Pallor: No Pallor: No Rubor: No Rubor: No Temperature: No Abnormality No Abnormality N/A Tenderness on Palpation: Yes Yes N/A Wound Preparation: Ulcer Cleansing: Ulcer Cleansing: N/A Rinsed/Irrigated with Saline Rinsed/Irrigated with Saline Topical Anesthetic Applied: Topical Anesthetic Applied: Other: lidocaine 4% Other: lidocaine 4% Treatment Notes Electronic Signature(s) Signed: 01/30/2018 4:06:48 PM By: Alejandro Mulling Entered By: Alejandro Mulling on 01/30/2018 14:57:53 [name] ([id number]) -------------------------------------------------------------------------------- Multi-Disciplinary Care Plan Details Patient Name: [name] Date of Service: 01/30/2018 2:30 PM Medical Record Number: [id number] Patient Account Number: [id number] Date of Birth/Sex: 1930/02/18 (84 y.o. Female) Treating RN: [name], Debi Primary Care Janyth Riera: [name] Other Clinician: Referring Prayan Ulin: [name] Treating Joury Allcorn/Extender: [name], [name] in Treatment: 11 Active Inactive ` Orientation to the Wound Care Program Nursing Diagnoses: Knowledge deficit related to the wound healing center program Goals: Patient/caregiver will verbalize understanding of the Wound Healing Center Program Date Initiated: 11/14/2017 Target Resolution Date: 11/28/2017 Goal Status: Active Interventions: Provide education on orientation to the wound center Notes: ` Wound/Skin Impairment Nursing Diagnoses: Impaired tissue integrity Knowledge deficit related to  ulceration/compromised skin integrity Goals: Patient/caregiver will verbalize understanding of skin care regimen Date Initiated: 11/14/2017 Target Resolution Date: 11/28/2017 Goal Status: Active Ulcer/skin breakdown will have a volume reduction of 30% by week 4 Date Initiated: 11/14/2017 Target Resolution Date: 11/28/2017 Goal Status: Active Interventions: Assess patient/caregiver ability to obtain necessary supplies Assess patient/caregiver ability to perform ulcer/skin care regimen upon admission and as needed Assess ulceration(s) every visit Treatment Activities: Skin care regimen initiated : 11/14/2017 Notes: Electronic Signature(s) Signed: 01/30/2018 4:06:48 PM By: Vira Agar, Lawernce Keas ([id number]) Entered By: Alejandro Mulling on 01/30/2018 14:57:38 [name] ([id number]) -------------------------------------------------------------------------------- Pain Assessment Details Patient Name: [name] Date of Service: 01/30/2018 2:30 PM Medical Record Number: [id number] Patient Account Number: [id number] Date of Birth/Sex: 27-Nov-1930 (84 y.o. Female) Treating RN: Curtis Sites Primary Care Carolie Mcilrath: [name] Other Clinician: Referring Warrene Kapfer: [name] Treating Francess Mullen/Extender: [name], [name] in Treatment: 11 Active Problems Location of Pain Severity and Description of Pain Patient Has Paino No Site Locations Pain Management and Medication Current Pain Management: Electronic Signature(s) Signed: 01/30/2018 4:30:06 PM By: Curtis Sites Entered By: Curtis Sites on 01/30/2018 14:31:08 [name] ([id number]) -------------------------------------------------------------------------------- Patient/Caregiver Education Details Patient Name: [name] Date of Service: 01/30/2018 2:30 PM Medical Record Number: [id number] Patient Account Number: [id number] Date  of Birth/Gender: January 16, 1930 (84 y.o. Female) Treating RN:  Renne Crigler Primary Care Physician: [name] Other Clinician: Referring Physician: Aram Barnett Treating Physician/Extender: Skeet Simmer in Treatment: 11 Education Assessment Education Provided To: Patient Education Topics Provided Wound/Skin Impairment: Methods: Explain/Verbal Responses: State content correctly Electronic Signature(s) Signed: 01/31/2018 9:41:33 AM By: Renne Crigler Entered By: Renne Crigler on 01/30/2018 15:23:54 [name] ([id number]) -------------------------------------------------------------------------------- Wound Assessment Details Patient Name: [name] Date of Service: 01/30/2018 2:30 PM Medical Record Number: [id number] Patient Account Number: [id number] Date of Birth/Sex: 1930/02/03 (84 y.o. Female) Treating RN: Curtis Sites Primary Care Marquis Down: [name] Other Clinician: Referring Rayder Sullenger: [name] Treating Joanna Borawski/Extender: [name], [name] in Treatment: 11 Wound Status Wound Number: 5 Primary Etiology: Trauma, Other Wound Location: Right Lower Leg - Anterior Wound Status: Open Wounding Event: Trauma Comorbid Hypertension, Gout, Osteoarthritis, History: Neuropathy Date Acquired: 09/05/2017 Barnett Of Treatment: 11 Clustered Wound: No Photos Photo Uploaded By: Curtis Sites on 01/30/2018 15:02:19 Wound Measurements Length: (cm) 0.9 Width: (cm) 0.9 Depth: (cm) 0.1 Area: (cm) 0.636 Volume: (cm) 0.064 % Reduction in Area: 51.8% % Reduction in Volume: 51.5% Epithelialization: Small (1-33%) Tunneling: No Undermining: No Wound Description Full Thickness With Exposed Support Foul O Classification: Structures Slough Wound Margin: Distinct, outline attached Exudate Medium Amount: Exudate Type: Serous Exudate Color: amber dor After Cleansing: No /Fibrino Yes Wound Bed Granulation Amount: Medium (34-66%) Exposed Structure Granulation Quality: Red Fascia Exposed:  No Necrotic Amount: Medium (34-66%) Fat Layer (Subcutaneous Tissue) Exposed: Yes Necrotic Quality: Adherent Slough Tendon Exposed: No Muscle Exposed: No Joint Exposed: No Bone Exposed: No [name], [name] ([id number]) Periwound Skin Texture Texture Color No Abnormalities Noted: No No Abnormalities Noted: No Callus: No Atrophie Blanche: No Crepitus: No Cyanosis: No Excoriation: No Ecchymosis: No Induration: Yes Erythema: No Rash: No Hemosiderin Staining: No Scarring: Yes Mottled: No Pallor: No Moisture Rubor: No No Abnormalities Noted: No Dry / Scaly: No Temperature / Pain Maceration: No Temperature: No Abnormality Tenderness on Palpation: Yes Wound Preparation Ulcer Cleansing: Rinsed/Irrigated with Saline Topical Anesthetic Applied: Other: lidocaine 4%, Treatment Notes Wound #5 (Right, Anterior Lower Leg) 1. Cleansed with: Clean wound with Normal Saline 2. Anesthetic Topical Lidocaine 4% cream to wound bed prior to debridement 4. Dressing Applied: Prisma Ag 5. Secondary Dressing Applied Bordered Foam Dressing Electronic Signature(s) Signed: 01/30/2018 4:30:06 PM By: Curtis Sites Entered By: Curtis Sites on 01/30/2018 14:35:27 [name] ([id number]) -------------------------------------------------------------------------------- Wound Assessment Details Patient Name: [name] Date of Service: 01/30/2018 2:30 PM Medical Record Number: [id number] Patient Account Number: [id number] Date of Birth/Sex: 1930-10-31 (84 y.o. Female) Treating RN: Curtis Sites Primary Care Prospero Mahnke: [name] Other Clinician: Referring Yossi Hinchman: [name] Treating Nada Godley/Extender: [name], [name] in Treatment: 11 Wound Status Wound Number: 6 Primary Etiology: Skin Tear Wound Location: Right Lower Leg - Posterior Wound Status: Open Wounding Event: Trauma Comorbid Hypertension, Gout, Osteoarthritis, History: Neuropathy Date Acquired:  01/26/2018 Barnett Of Treatment: 0 Clustered Wound: No Photos Photo Uploaded By: Curtis Sites on 01/30/2018 15:02:46 Wound Measurements Length: (cm) 2.4 Width: (cm) 1.6 Depth: (cm) 0.1 Area: (cm) 3.016 Volume: (cm) 0.302 % Reduction in Area: % Reduction in Volume: Epithelialization: Medium (34-66%) Tunneling: No Undermining: No Wound Description Classification: Partial Thickness Foul O Wound Margin: Flat and Intact Slough Exudate Amount: Medium Exudate Type: Serous Exudate Color: amber dor After Cleansing: No /Fibrino No Wound Bed Granulation Amount: Large (67-100%) Exposed Structure Granulation Quality: Red Fascia Exposed: No Necrotic Amount: None Present (0%) Fat Layer (  Subcutaneous Tissue) Exposed: No Tendon Exposed: No Muscle Exposed: No Joint Exposed: No Bone Exposed: No Periwound Skin Texture [name], DOFFING. ([id number]) Texture Color No Abnormalities Noted: No No Abnormalities Noted: No Callus: No Atrophie Blanche: No Crepitus: No Cyanosis: No Excoriation: No Ecchymosis: No Induration: No Erythema: No Rash: No Hemosiderin Staining: No Scarring: No Mottled: No Pallor: No Moisture Rubor: No No Abnormalities Noted: No Dry / Scaly: No Temperature / Pain Maceration: No Temperature: No Abnormality Tenderness on Palpation: Yes Wound Preparation Ulcer Cleansing: Rinsed/Irrigated with Saline Topical Anesthetic Applied: Other: lidocaine 4%, Treatment Notes Wound #6 (Right, Posterior Lower Leg) 1. Cleansed with: Clean wound with Normal Saline 2. Anesthetic Topical Lidocaine 4% cream to wound bed prior to debridement 4. Dressing Applied: Other dressing (specify in notes) 5. Secondary Dressing Applied Bordered Foam Dressing Notes [employer] Electronic Signature(s) Signed: 01/30/2018 4:30:06 PM By: Curtis Sites Entered By: Curtis Sites on 01/30/2018 14:38:26 [name]  ([id number]) -------------------------------------------------------------------------------- Vitals Details Patient Name: [name] Date of Service: 01/30/2018 2:30 PM Medical Record Number: [id number] Patient Account Number: [id number] Date of Birth/Sex: [birth year] (84 y.o. Female) Treating RN: Curtis Sites Primary Care Chaniece Barbato: [name] Other Clinician: Referring Elizandro Laura: [name] Treating Ioan Landini/Extender: [name], [name] in Treatment: 11 Vital Signs Time Taken: 14:31 Temperature (F): 97.8 Height (in): 60 Pulse (bpm): 67 Weight (lbs): 123 Respiratory Rate (breaths/min): 16 Body Mass Index (BMI): 24 Blood Pressure (mmHg): 140/61 Reference Range: 80 - 120 mg / dl Electronic Signature(s) Signed: 01/30/2018 4:30:06 PM By: Curtis Sites Entered By: Curtis Sites on 01/30/2018 14:33:34

## 2018-02-06 ENCOUNTER — Ambulatory Visit: Payer: Medicare Other | Admitting: Physical Therapy

## 2018-02-06 ENCOUNTER — Encounter: Payer: Medicare Other | Admitting: Physician Assistant

## 2018-02-06 DIAGNOSIS — L97812 Non-pressure chronic ulcer of other part of right lower leg with fat layer exposed: Secondary | ICD-10-CM | POA: Diagnosis not present

## 2018-02-08 ENCOUNTER — Encounter: Payer: Medicare Other | Admitting: Physical Therapy

## 2018-02-08 NOTE — Progress Notes (Signed)
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 every other day. Wound #6 Right,Posterior Lower Leg: Change dressing every other day. Follow-up Appointments: Wound #5 Right,Anterior Lower Leg: Return Appointment in 1 week. Wound #6 Right,Posterior Lower Leg: Return Appointment in 1 week. Edema Control: Wound #5 Right,Anterior Lower Leg: Elevate legs to the level of the heart and pump ankles as often as possible Wound #6 Right,Posterior Lower Leg: Elevate legs to the level of the heart and pump ankles as often as possible Additional Orders / Instructions: Wound #5 Right,Anterior Lower Leg: Increase protein intake. Wound #6 Right,Posterior Lower Leg: Increase protein intake. The following medication(s) was prescribed: lidocaine topical 4 % cream 1 1 cream topical was prescribed at facility I am going to recommend at this point that we continue continue with the current wound care measures for the next week since she seems to be doing so well. Patient is in agreement that plan. In regard to  the posterior thigh however on the right we will allow her just to use the Vaseline with iodine in it that she has currently in use a Band-Aid over this so that could be changed more often if need be. KEVIANA, BALSAM ([id number]) Please see above for specific wound care orders. We will see patient for re-evaluation in 1 week(s) here in the clinic. If anything worsens or changes patient will contact our office for additional recommendations. Electronic Signature(s) Signed: 02/06/2018 5:35:20 PM By: Lenda Kelp PA-C Entered By: Lenda Kelp on 02/06/2018 16:41:11 [name] ([id number]) -------------------------------------------------------------------------------- ROS/PFSH Details Patient Name: [name] Date of Service: 02/06/2018 2:45 PM Medical Record Number: [id number] Patient Account Number: [id number] Date of Birth/Sex: 24-Oct-1930 (84 y.o. Female) Treating RN: Ashok Cordia, Debi Primary Care Provider: Aram Beecham Other Clinician: Referring Provider: Aram Beecham Treating Provider/Extender: Linwood Dibbles, Lauryn Lizardi Weeks in Treatment: 12 Information Obtained From Patient Wound History Do you currently have one or more open woundso Yes How many open wounds do you currently haveo 1 Approximately how long have you had your woundso 10 weeks How have you been treating your wound(s) until nowo mupirocin Has your wound(s) ever healed and then re-openedo No Have you had any lab work done in the past montho No Have you tested positive for an antibiotic resistant organism (MRSA, VRE)o No Have you tested positive for osteomyelitis (bone infection)o No Have you had any tests for circulation on your legso Yes Who ordered the testo dr schnier [facility] Have you had other problems associated with your woundso Swelling Constitutional Symptoms (General Health) Complaints and Symptoms: Negative for: Fever; Chills Medical History: Past Medical History  Notes: Back pain Cardiovascular Complaints and Symptoms: Positive for: LE edema Medical History: Positive for: Hypertension Negative for: Angina; Arrhythmia; Congestive Heart Failure; Coronary Artery Disease; Deep Vein Thrombosis; Hypotension; Myocardial Infarction; Peripheral Arterial Disease; Peripheral Venous Disease; Phlebitis; Vasculitis Past Medical History Notes: pacemaker Eyes Medical History: Negative for: Cataracts; Glaucoma; Optic Neuritis Ear/Nose/Mouth/Throat Medical History: Negative for: Chronic sinus problems/congestion; Middle ear problems Past Medical History Notes: bilateral hearing aides HAVYNN, CASTERLINE ([id number]) Hematologic/Lymphatic Medical History: Negative for: Anemia; Hemophilia; Human Immunodeficiency Virus; Lymphedema; Sickle Cell Disease Respiratory  Complaints and Symptoms: No Complaints or Symptoms Medical History: Negative for: Aspiration; Asthma; Chronic Obstructive Pulmonary Disease (COPD); Pneumothorax; Sleep Apnea; Tuberculosis Gastrointestinal Medical History: Negative for: Cirrhosis ; Colitis; Crohnos; Hepatitis A; Hepatitis B; Hepatitis C Endocrine Medical History: Negative for: Type I Diabetes; Type II Diabetes Genitourinary Medical History: Negative for: End Stage Renal Disease Immunological Medical History: Negative for: Lupus Erythematosus; Raynaudos; Scleroderma Musculoskeletal Medical History: Positive for: Gout; Osteoarthritis Negative for: Rheumatoid Arthritis; Osteomyelitis Neurologic Medical History: Positive for: Neuropathy - lower legs Negative for: Dementia; Quadriplegia; Paraplegia; Seizure Disorder Oncologic Medical History: Negative for: Received Chemotherapy; Received Radiation Psychiatric Complaints and Symptoms: No Complaints or Symptoms Medical History: Negative for: Anorexia/bulimia; Confinement Anxiety Immunizations KRITI, KATAYAMA ([id number]) Pneumococcal Vaccine: Received Pneumococcal  Vaccination: Yes Implantable Devices Family and Social History Cancer: Yes - Mother,Siblings; Diabetes: Yes - Father; Heart Disease: Yes - Father,Siblings; Hereditary Spherocytosis: No; Hypertension: Yes - Father,Siblings; Kidney Disease: No; Lung Disease: Yes - Siblings; Seizures: No; Stroke: No; Thyroid Problems: No; Tuberculosis: No; Never smoker; Marital Status - Married; Alcohol Use: Never; Drug Use: No History; Caffeine Use: Never; Financial Concerns: No; Food, Clothing or Shelter Needs: No; Support System Lacking: No; Transportation Concerns: No; Advanced Directives: No; Patient does not want information on Advanced Directives; Medical Power of Attorney: Yes - Aviendha Azbell (Copy provided) Physician Affirmation I have reviewed and agree with the above information. Electronic Signature(s) Signed: 02/06/2018 5:35:20 PM By: Lenda Kelp PA-C Signed: 02/07/2018 4:30:35 PM By: Alejandro Mulling Entered By: Lenda Kelp on 02/06/2018 16:39:48 [name] ([id number]) -------------------------------------------------------------------------------- SuperBill Details Patient Name: [name] Date of Service: 02/06/2018 Medical Record Number: [id number] Patient Account Number: [id number] Date of Birth/Sex: 1929/12/15 (84 y.o. Female) Treating RN: Ashok Cordia, Debi Primary Care Provider: Aram Beecham Other Clinician: Referring Provider: Aram Beecham Treating Provider/Extender: Linwood Dibbles, Zaki Gertsch Weeks in Treatment: 12 Diagnosis Coding ICD-10 Codes Code Description S81.811S Laceration without foreign body, right lower leg, sequela L97.812 Non-pressure chronic ulcer of other part of right lower leg with fat layer exposed Facility Procedures CPT4 Code: [id number] Description: 99213 - WOUND CARE VISIT-LEV 3 EST PT Modifier: Quantity: 1 Physician Procedures CPT4 Code Description: [id number] 99213 - WC PHYS LEVEL 3 - EST PT ICD-10 Diagnosis Description S81.811S Laceration without  foreign body, right lower leg, sequela L97.812 Non-pressure chronic ulcer of other part of right lower leg wi Modifier: th fat layer expo Quantity: 1 sed Electronic Signature(s) Signed: 02/06/2018 5:35:20 PM By: Lenda Kelp PA-C Previous Signature: 02/06/2018 4:22:45 PM Version By: Alejandro Mulling Entered By: Lenda Kelp on 02/06/2018 16:41:30

## 2018-02-09 ENCOUNTER — Ambulatory Visit: Payer: Medicare Other | Admitting: Physical Therapy

## 2018-02-11 NOTE — Progress Notes (Signed)
Natasha, Barnett ([id number]) Visit Report for 02/06/2018 Arrival Information Details Patient Name: [name], [name]. Date of Service: 02/06/2018 2:45 PM Medical Record Number: [id number] Patient Account Number: [id number] Date of Birth/Sex: May 30, 1930 (84 y.o. Female) Treating RN: Ashok Cordia, Debi Primary Care Fernandez Kenley: Aram Beecham Other Clinician: Referring Massiah Minjares: Aram Beecham Treating Sebastiano Luecke/Extender: Linwood Dibbles, HOYT Weeks in Treatment: 12 Visit Information History Since Last Visit Added or deleted any medications: No Patient Arrived: Cane Any new allergies or adverse reactions: No Arrival Time: 14:56 Had a fall or experienced change in No Accompanied By: caregiver activities of daily living that may affect Transfer Assistance: Manual risk of falls: Patient Identification Verified: Yes Signs or symptoms of abuse/neglect since last visito No Secondary Verification Process Completed: Yes Has Dressing in Place as Prescribed: Yes Patient Requires Transmission-Based No Pain Present Now: No Precautions: Patient Has Alerts: No Electronic Signature(s) Signed: 02/06/2018 4:18:18 PM By: Dayton Martes RCP, RRT, CHT Entered By: Dayton Martes on 02/06/2018 14:57:28 [name] ([id number]) -------------------------------------------------------------------------------- Clinic Level of Care Assessment Details Patient Name: [name] Date of Service: 02/06/2018 2:45 PM Medical Record Number: [id number] Patient Account Number: [id number] Date of Birth/Sex: January 19, 1930 (84 y.o. Female) Treating RN: Ashok Cordia, Debi Primary Care Dreshawn Hendershott: Aram Beecham Other Clinician: Referring Ulas Zuercher: Aram Beecham Treating Chayse Zatarain/Extender: Linwood Dibbles, HOYT Weeks in Treatment: 12 Clinic Level of Care Assessment Items TOOL 4 Quantity Score X - Use when only an EandM is performed on FOLLOW-UP visit 1 0 ASSESSMENTS - Nursing Assessment / Reassessment X -  Reassessment of Co-morbidities (includes updates in patient status) 1 10 X- 1 5 Reassessment of Adherence to Treatment Plan ASSESSMENTS - Wound and Skin Assessment / Reassessment []  - Simple Wound Assessment / Reassessment - one wound 0 X- 2 5 Complex Wound Assessment / Reassessment - multiple wounds []  - 0 Dermatologic / Skin Assessment (not related to wound area) ASSESSMENTS - Focused Assessment []  - Circumferential Edema Measurements - multi extremities 0 []  - 0 Nutritional Assessment / Counseling / Intervention []  - 0 Lower Extremity Assessment (monofilament, tuning fork, pulses) []  - 0 Peripheral Arterial Disease Assessment (using hand held doppler) ASSESSMENTS - Ostomy and/or Continence Assessment and Care []  - Incontinence Assessment and Management 0 []  - 0 Ostomy Care Assessment and Management (repouching, etc.) PROCESS - Coordination of Care X - Simple Patient / Family Education for ongoing care 1 15 []  - 0 Complex (extensive) Patient / Family Education for ongoing care []  - 0 Staff obtains , Records, Test Results / Process Orders []  - 0 Staff telephones HHA, Nursing Homes / Clarify orders / etc []  - 0 Routine Transfer to another Facility (non-emergent condition) []  - 0 Routine Hospital Admission (non-emergent condition) []  - 0 New Admissions / / Ordering NPWT, Apligraf, etc. []  - 0 Emergency Hospital Admission (emergent condition) X- 1 10 Simple Discharge Coordination SARAI, JANUARY ( ) []  - 0 Complex (extensive) Discharge Coordination PROCESS - Special Needs []  - Pediatric / Minor Patient Management 0 []  - 0 Isolation Patient Management []  - 0 Hearing / Language / Visual special needs []  - 0 Assessment of Community assistance (transportation, D/C planning, etc.) []  - 0 Additional assistance / Altered mentation []  - 0 Support Surface(s) Assessment (bed, cushion, seat, etc.) INTERVENTIONS - Wound Cleansing /  Measurement []  - Simple Wound Cleansing - one wound 0 X- 2 5 Complex Wound Cleansing - multiple wounds X- 1 5 Wound Imaging (photographs - any number of wounds) []  - 0 Wound Tracing (  instead of photographs) []  - 0 Simple Wound Measurement - one wound X- 2 5 Complex Wound Measurement - multiple wounds INTERVENTIONS - Wound Dressings X - Small Wound Dressing one or multiple wounds 2 10 []  - 0 Medium Wound Dressing one or multiple wounds []  - 0 Large Wound Dressing one or multiple wounds X- 1 5 Application of Medications - topical []  - 0 Application of Medications - injection INTERVENTIONS - Miscellaneous []  - External ear exam 0 []  - 0 Specimen Collection (cultures, biopsies, blood, body fluids, etc.) []  - 0 Specimen(s) / Culture(s) sent or taken to Lab for analysis []  - 0 Patient Transfer (multiple staff / / Similar devices) []  - 0 Simple Staple / Suture removal (25 or less) []  - 0 Complex Staple / Suture removal (26 or more) []  - 0 Hypo / Hyperglycemic Management (close monitor of Blood Glucose) []  - 0 Ankle / Brachial Index (ABI) - do not check if billed separately X- 1 5 Vital Signs TENYA, ARAQUE ( ) Has the patient been seen at the hospital within the last three years: Yes Total Score: 105 Level Of Care: New/Established - Level 3 Electronic Signature(s) Signed: 02/07/2018 4:30:35 PM By: Entered By: on 02/06/2018 16:22:39 (Nurse, adult) -------------------------------------------------------------------------------- Encounter Discharge Information Details Patient Name: Date of Service: 02/06/2018 2:45 PM Medical Record Number: Patient Account Number: Date of Birth/Sex: 1930-01-04 (84 y.o. Female) Treating RN: 02/09/2018, Debi Primary Care Elodie Panameno: Alejandro Mulling Other Clinician: Referring Lynett Brasil: Alejandro Mulling Treating Latiesha Harada/Extender: 02/08/2018,  HOYT Weeks in Treatment: 12 Encounter Discharge Information Items Discharge Pain Level: 0 Discharge Condition: Stable Ambulatory Status: Cane Discharge Destination: Home Transportation: Private Auto Accompanied By: friend Schedule Follow-up Appointment: Yes Medication Reconciliation completed and No provided to Patient/Care Josephmichael Lisenbee: Provided on Clinical Summary of Care: 02/06/2018 Form Type Recipient Paper Patient [facility] Electronic Signature(s) Signed: 02/09/2018 5:56:09 PM By: 02/08/2018 Entered By: [id number] on 02/06/2018 15:38:16 13/05/1930 (85) -------------------------------------------------------------------------------- Lower Extremity Assessment Details Patient Name: Ashok Cordia Date of Service: 02/06/2018 2:45 PM Medical Record Number: Aram Beecham Patient Account Number: Linwood Dibbles Date of Birth/Sex: May 02, 1930 (84 y.o. Female) Treating RN: 04/11/2018 Primary Care Loris Winrow: Renne Crigler Other Clinician: Referring Xitlaly Ault: Renne Crigler Treating Gerianne Simonet/Extender: 02/08/2018, HOYT Weeks in Treatment: 12 Vascular Assessment Pulses: Dorsalis Pedis Palpable: [Right:Yes] Posterior Tibial Extremity colors, hair growth, and conditions: Extremity Color: [Right:Hyperpigmented] Hair Growth on Extremity: [Right:Yes] Temperature of Extremity: [Right:Warm] Capillary Refill: [Right:< 3 seconds] Toe Nail Assessment Left: Right: Thick: Yes Discolored: No Deformed: No Improper Length and Hygiene: No Electronic Signature(s) Signed: 02/06/2018 4:46:41 PM By: [id number] Entered By: [name] on 02/06/2018 15:04:42 [id number] ([id number]) -------------------------------------------------------------------------------- Multi Wound Chart Details Patient Name: 13/05/1930 Date of Service: 02/06/2018 2:45 PM Medical Record Number: Curtis Sites Patient Account Number: Aram Beecham Date of Birth/Sex: 12-Feb-1930 (84 y.o.  Female) Treating RN: 02/08/2018, Debi Primary Care Nanette Wirsing: Curtis Sites Other Clinician: Referring Jaren Kearn: Curtis Sites Treating Sadie Pickar/Extender: 02/08/2018, HOYT Weeks in Treatment: 12 Vital Signs Height(in): 60 Pulse(bpm): 70 Weight(lbs): 123 Blood Pressure(mmHg): 124/53 Body Mass Index(BMI): 24 Temperature(F): 97.8 Respiratory Rate 16 (breaths/min): Photos: [5:No Photos] [6:No Photos] [N/A:N/A] Wound Location: [5:Right Lower Leg - Anterior] [6:Right Lower Leg - Posterior] [N/A:N/A] Wounding Event: [5:Trauma] [6:Trauma] [N/A:N/A] Primary Etiology: [5:Trauma, Other] [6:Skin Tear] [N/A:N/A] Comorbid History: [5:Hypertension, Gout, Osteoarthritis, Neuropathy] [6:Hypertension, Gout, Osteoarthritis, Neuropathy] [N/A:N/A] Date Acquired: [5:09/05/2017] [6:01/26/2018] [N/A:N/A] Weeks of Treatment: [5:12] [6:1] [N/A:N/A] Wound Status: [  5:Open] [6:Open] [N/A:N/A] Measurements L x W x D [5:0.8x0.7x0.1] [6:0.5x0.3x0.1] [N/A:N/A] (cm) Area (cm) : [5:0.44] [6:0.118] [N/A:N/A] Volume (cm) : [5:0.044] [6:0.012] [N/A:N/A] % Reduction in Area: [5:66.60%] [6:96.10%] [N/A:N/A] % Reduction in Volume: [5:66.70%] [6:96.00%] [N/A:N/A] Classification: [5:Full Thickness With Exposed Support Structures] [6:Partial Thickness] [N/A:N/A] Exudate Amount: [5:Medium] [6:Medium] [N/A:N/A] Exudate Type: [5:Serous] [6:Serous] [N/A:N/A] Exudate Color: [5:amber] [6:amber] [N/A:N/A] Wound Margin: [5:Distinct, outline attached] [6:Flat and Intact] [N/A:N/A] Granulation Amount: [5:Medium (34-66%)] [6:Large (67-100%)] [N/A:N/A] Granulation Quality: [5:Red] [6:Red] [N/A:N/A] Necrotic Amount: [5:Medium (34-66%)] [6:None Present (0%)] [N/A:N/A] Exposed Structures: [5:Fat Layer (Subcutaneous Tissue) Exposed: Yes Fascia: No Tendon: No Muscle: No Joint: No Bone: No] [6:Fascia: No Fat Layer (Subcutaneous Tissue) Exposed: No Tendon: No Muscle: No Joint: No Bone: No] [N/A:N/A] Epithelialization: [5:Small  (1-33%)] [6:Medium (34-66%)] [N/A:N/A] Periwound Skin Texture: [5:Induration: Yes Scarring: Yes Excoriation: No Callus: No] [6:Excoriation: No Induration: No Callus: No Crepitus: No] [N/A:N/A] Crepitus: No Rash: No Rash: No Scarring: No Periwound Skin Moisture: Maceration: No Maceration: No N/A Dry/Scaly: No Dry/Scaly: No Periwound Skin Color: Atrophie Blanche: No Atrophie Blanche: No N/A Cyanosis: No Cyanosis: No Ecchymosis: No Ecchymosis: No Erythema: No Erythema: No Hemosiderin Staining: No Hemosiderin Staining: No Mottled: No Mottled: No Pallor: No Pallor: No Rubor: No Rubor: No Temperature: No Abnormality No Abnormality N/A Tenderness on Palpation: Yes Yes N/A Wound Preparation: Ulcer Cleansing: Ulcer Cleansing: N/A Rinsed/Irrigated with Saline Rinsed/Irrigated with Saline Topical Anesthetic Applied: Topical Anesthetic Applied: Other: lidocaine 4% Other: lidocaine 4% Treatment Notes Electronic Signature(s) Signed: 02/07/2018 4:30:35 PM By: Alejandro Mulling Entered By: Alejandro Mulling on 02/06/2018 15:23:53 [name] ([id number]) -------------------------------------------------------------------------------- Multi-Disciplinary Care Plan Details Patient Name: [name] Date of Service: 02/06/2018 2:45 PM Medical Record Number: [id number] Patient Account Number: [id number] Date of Birth/Sex: 10-16-1930 (84 y.o. Female) Treating RN: Ashok Cordia, Debi Primary Care Jonte Wollam: Aram Beecham Other Clinician: Referring Harbor Vanover: Aram Beecham Treating Nubia Ziesmer/Extender: Linwood Dibbles, HOYT Weeks in Treatment: 12 Active Inactive ` Orientation to the Wound Care Program Nursing Diagnoses: Knowledge deficit related to the wound healing center program Goals: Patient/caregiver will verbalize understanding of the Wound Healing Center Program Date Initiated: 11/14/2017 Target Resolution Date: 11/28/2017 Goal Status: Active Interventions: Provide education  on orientation to the wound center Notes: ` Wound/Skin Impairment Nursing Diagnoses: Impaired tissue integrity Knowledge deficit related to ulceration/compromised skin integrity Goals: Patient/caregiver will verbalize understanding of skin care regimen Date Initiated: 11/14/2017 Target Resolution Date: 11/28/2017 Goal Status: Active Ulcer/skin breakdown will have a volume reduction of 30% by week 4 Date Initiated: 11/14/2017 Target Resolution Date: 11/28/2017 Goal Status: Active Interventions: Assess patient/caregiver ability to obtain necessary supplies Assess patient/caregiver ability to perform ulcer/skin care regimen upon admission and as needed Assess ulceration(s) every visit Treatment Activities: Skin care regimen initiated : 11/14/2017 Notes: Electronic Signature(s) Signed: 02/07/2018 4:30:35 PM By: Vira Agar, Lawernce Keas ([id number]) Entered By: Alejandro Mulling on 02/06/2018 15:23:44 [name] ([id number]) -------------------------------------------------------------------------------- Pain Assessment Details Patient Name: [name] Date of Service: 02/06/2018 2:45 PM Medical Record Number: [id number] Patient Account Number: [id number] Date of Birth/Sex: 1930/04/22 (84 y.o. Female) Treating RN: Ashok Cordia, Debi Primary Care Dayton Kenley: Aram Beecham Other Clinician: Referring Lavender Stanke: Aram Beecham Treating Caz Weaver/Extender: Linwood Dibbles, HOYT Weeks in Treatment: 12 Active Problems Location of Pain Severity and Description of Pain Patient Has Paino No Site Locations Pain Management and Medication Current Pain Management: Electronic Signature(s) Signed: 02/06/2018 4:18:18 PM By: Sallee Provencal, RRT, CHT Signed: 02/07/2018 4:30:35 PM By: Alejandro Mulling Entered By: Dayton Martes on 02/06/2018 14:57:44  BRITTAN, BUTTERBAUGH  ([id number]) -------------------------------------------------------------------------------- Patient/Caregiver Education Details Patient Name: ANELI, ZARA Date of Service: 02/06/2018 2:45 PM Medical Record Number: [id number] Patient Account Number: [id number] Date of Birth/Gender: 1930-02-06 (84 y.o. Female) Treating RN: Renne Crigler Primary Care Physician: Aram Beecham Other Clinician: Referring Physician: Aram Beecham Treating Physician/Extender: Skeet Simmer in Treatment: 12 Education Assessment Education Provided To: Patient Education Topics Provided Wound/Skin Impairment: Handouts: Caring for Your Ulcer Methods: Explain/Verbal Responses: State content correctly Electronic Signature(s) Signed: 02/09/2018 5:56:09 PM By: Renne Crigler Entered By: Renne Crigler on 02/06/2018 15:38:27 [name] ([id number]) -------------------------------------------------------------------------------- Wound Assessment Details Patient Name: [name] Date of Service: 02/06/2018 2:45 PM Medical Record Number: [id number] Patient Account Number: [id number] Date of Birth/Sex: [birth year] (84 y.o. Female) Treating RN: Curtis Sites Primary Care Mickael Mcnutt: Aram Beecham Other Clinician: Referring Kainoa Swoboda: Aram Beecham Treating Marleen Moret/Extender: Linwood Dibbles, HOYT Weeks in Treatment: 12 Wound Status Wound Number: 5 Primary Etiology: Trauma, Other Wound Location: Right Lower Leg - Anterior Wound Status: Open Wounding Event: Trauma Comorbid Hypertension, Gout, Osteoarthritis, History: Neuropathy Date Acquired: 09/05/2017 Weeks Of Treatment: 12 Clustered Wound: No Photos Photo Uploaded By: Curtis Sites on 02/06/2018 15:27:35 Wound Measurements Length: (cm) 0.8 Width: (cm) 0.7 Depth: (cm) 0.1 Area: (cm) 0.44 Volume: (cm) 0.044 % Reduction in Area: 66.6% % Reduction in Volume: 66.7% Epithelialization: Small (1-33%) Tunneling: No Undermining:  No Wound Description Full Thickness With Exposed Support Foul O Classification: Structures Slough Wound Margin: Distinct, outline attached Exudate Medium Amount: Exudate Type: Serous Exudate Color: amber dor After Cleansing: No /Fibrino Yes Wound Bed Granulation Amount: Medium (34-66%) Exposed Structure Granulation Quality: Red Fascia Exposed: No Necrotic Amount: Medium (34-66%) Fat Layer (Subcutaneous Tissue) Exposed: Yes Necrotic Quality: Adherent Slough Tendon Exposed: No Muscle Exposed: No Joint Exposed: No Bone Exposed: No RENESMEE, RAINE V. ([id number]) Periwound Skin Texture Texture Color No Abnormalities Noted: No No Abnormalities Noted: No Callus: No Atrophie Blanche: No Crepitus: No Cyanosis: No Excoriation: No Ecchymosis: No Induration: Yes Erythema: No Rash: No Hemosiderin Staining: No Scarring: Yes Mottled: No Pallor: No Moisture Rubor: No No Abnormalities Noted: No Dry / Scaly: No Temperature / Pain Maceration: No Temperature: No Abnormality Tenderness on Palpation: Yes Wound Preparation Ulcer Cleansing: Rinsed/Irrigated with Saline Topical Anesthetic Applied: Other: lidocaine 4%, Treatment Notes Wound #5 (Right, Anterior Lower Leg) 1. Cleansed with: Clean wound with Normal Saline 2. Anesthetic Topical Lidocaine 4% cream to wound bed prior to debridement 4. Dressing Applied: Prisma Ag 5. Secondary Dressing Applied Bordered Foam Dressing Electronic Signature(s) Signed: 02/06/2018 4:46:41 PM By: Curtis Sites Entered By: Curtis Sites on 02/06/2018 15:03:29 [name] ([id number]) -------------------------------------------------------------------------------- Wound Assessment Details Patient Name: [name] Date of Service: 02/06/2018 2:45 PM Medical Record Number: [id number] Patient Account Number: [id number] Date of Birth/Sex: 10-Sep-1930 (84 y.o. Female) Treating RN: Curtis Sites Primary Care Matheson Vandehei: Aram Beecham  Other Clinician: Referring Jassmin Kemmerer: Aram Beecham Treating Avaya Mcjunkins/Extender: Linwood Dibbles, HOYT Weeks in Treatment: 12 Wound Status Wound Number: 6 Primary Etiology: Skin Tear Wound Location: Right Lower Leg - Posterior Wound Status: Open Wounding Event: Trauma Comorbid Hypertension, Gout, Osteoarthritis, History: Neuropathy Date Acquired: 01/26/2018 Weeks Of Treatment: 1 Clustered Wound: No Photos Photo Uploaded By: Curtis Sites on 02/06/2018 15:27:36 Wound Measurements Length: (cm) 0.5 Width: (cm) 0.3 Depth: (cm) 0.1 Area: (cm) 0.118 Volume: (cm) 0.012 % Reduction in Area: 96.1% % Reduction in Volume: 96% Epithelialization: Medium (34-66%) Tunneling: No Undermining: No Wound Description Classification: Partial Thickness Foul O Wound Margin: Flat and Intact Slough Exudate  Amount: Medium Exudate Type: Serous Exudate Color: amber dor After Cleansing: No /Fibrino No Wound Bed Granulation Amount: Large (67-100%) Exposed Structure Granulation Quality: Red Fascia Exposed: No Necrotic Amount: None Present (0%) Fat Layer (Subcutaneous Tissue) Exposed: No Tendon Exposed: No Muscle Exposed: No Joint Exposed: No Bone Exposed: No Periwound Skin Texture KAISLEY, BELMONTE V. ([id number]) Texture Color No Abnormalities Noted: No No Abnormalities Noted: No Callus: No Atrophie Blanche: No Crepitus: No Cyanosis: No Excoriation: No Ecchymosis: No Induration: No Erythema: No Rash: No Hemosiderin Staining: No Scarring: No Mottled: No Pallor: No Moisture Rubor: No No Abnormalities Noted: No Dry / Scaly: No Temperature / Pain Maceration: No Temperature: No Abnormality Tenderness on Palpation: Yes Wound Preparation Ulcer Cleansing: Rinsed/Irrigated with Saline Topical Anesthetic Applied: Other: lidocaine 4%, Treatment Notes Wound #6 (Right, Posterior Lower Leg) 1. Cleansed with: Clean wound with Normal Saline 2. Anesthetic Topical Lidocaine 4% cream to wound  bed prior to debridement 4. Dressing Applied: Prisma Ag 5. Secondary Dressing Applied Bordered Foam Dressing Electronic Signature(s) Signed: 02/06/2018 4:46:41 PM By: Curtis Sites Entered By: Curtis Sites on 02/06/2018 15:04:20 [name] ([id number]) -------------------------------------------------------------------------------- Vitals Details Patient Name: [name] Date of Service: 02/06/2018 2:45 PM Medical Record Number: [id number] Patient Account Number: [id number] Date of Birth/Sex: March 14, 1930 (84 y.o. Female) Treating RN: Ashok Cordia, Debi Primary Care Chantz Montefusco: Aram Beecham Other Clinician: Referring Joni Norrod: Aram Beecham Treating Avi Archuleta/Extender: Linwood Dibbles, HOYT Weeks in Treatment: 12 Vital Signs Time Taken: 14:50 Temperature (F): 97.8 Height (in): 60 Pulse (bpm): 70 Weight (lbs): 123 Respiratory Rate (breaths/min): 16 Body Mass Index (BMI): 24 Blood Pressure (mmHg): 124/53 Reference Range: 80 - 120 mg / dl Electronic Signature(s) Signed: 02/06/2018 4:18:18 PM By: Dayton Martes RCP, RRT, CHT Entered By: Dayton Martes on 02/06/2018 14:58:16

## 2018-02-12 ENCOUNTER — Encounter: Payer: Medicare Other | Attending: Physician Assistant | Admitting: Physician Assistant

## 2018-02-12 DIAGNOSIS — L97812 Non-pressure chronic ulcer of other part of right lower leg with fat layer exposed: Secondary | ICD-10-CM | POA: Insufficient documentation

## 2018-02-12 DIAGNOSIS — Z950 Presence of cardiac pacemaker: Secondary | ICD-10-CM | POA: Diagnosis not present

## 2018-02-12 DIAGNOSIS — G629 Polyneuropathy, unspecified: Secondary | ICD-10-CM | POA: Diagnosis not present

## 2018-02-12 DIAGNOSIS — M109 Gout, unspecified: Secondary | ICD-10-CM | POA: Diagnosis not present

## 2018-02-12 DIAGNOSIS — I10 Essential (primary) hypertension: Secondary | ICD-10-CM | POA: Insufficient documentation

## 2018-02-13 ENCOUNTER — Ambulatory Visit: Payer: Medicare Other | Attending: Internal Medicine | Admitting: Physical Therapy

## 2018-02-13 DIAGNOSIS — R2681 Unsteadiness on feet: Secondary | ICD-10-CM | POA: Insufficient documentation

## 2018-02-15 NOTE — Progress Notes (Signed)
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 been treating your wound(s) until nowo mupirocin Has your wound(s) ever healed and then re-openedo No Have you had any lab work done in the past montho No Have you tested positive for an antibiotic resistant organism (MRSA, VRE)o No Have you tested positive for osteomyelitis (bone infection)o No Have you had any tests for circulation on your legso Yes Who ordered the testo dr schnier [facility] Have you had other problems associated with your woundso Swelling Constitutional Symptoms (General Health) Complaints and Symptoms: Negative for: Fever; Chills Medical History: Past Medical History Notes: Back pain Eyes Medical History: Negative for: Cataracts; Glaucoma; Optic Neuritis Ear/Nose/Mouth/Throat Medical History: Negative for: Chronic sinus problems/congestion; Middle ear problems Past Medical History Notes: bilateral hearing aides Hematologic/Lymphatic Medical History: Negative for: Anemia; Hemophilia; Human Immunodeficiency Virus; Lymphedema; Sickle Cell  Disease Respiratory Complaints and Symptoms: No Complaints or Symptoms Medical History: Negative for: Aspiration; Asthma; Chronic Obstructive Pulmonary Disease (COPD); Pneumothorax; Sleep Apnea; [name], [name] ([id number]) Tuberculosis Cardiovascular Complaints and Symptoms: No Complaints or Symptoms Medical History: Positive for: Hypertension Negative for: Angina; Arrhythmia; Congestive Heart Failure; Coronary Artery Disease; Deep Vein Thrombosis; Hypotension; Myocardial Infarction; Peripheral Arterial Disease; Peripheral Venous Disease; Phlebitis; Vasculitis Past Medical History Notes: pacemaker Gastrointestinal Medical History: Negative for: Cirrhosis ; Colitis; Crohnos; Hepatitis A; Hepatitis B; Hepatitis C Endocrine Medical History: Negative for: Type I Diabetes; Type II Diabetes Genitourinary Medical History: Negative for: End Stage Renal Disease Immunological Medical History: Negative for: Lupus Erythematosus; Raynaudos; Scleroderma Musculoskeletal Medical History: Positive for: Gout; Osteoarthritis Negative for: Rheumatoid Arthritis; Osteomyelitis Neurologic Medical History: Positive for: Neuropathy - lower legs Negative for: Dementia; Quadriplegia; Paraplegia; Seizure Disorder Oncologic Medical History: Negative for: Received Chemotherapy; Received Radiation Psychiatric Complaints and Symptoms: No Complaints or Symptoms Medical History: Negative for: Anorexia/bulimia; Confinement Anxiety [name], [name] ([id number]) Immunizations Pneumococcal Vaccine: Received Pneumococcal Vaccination: Yes Implantable Devices Family and Social History Cancer: Yes - Mother,Siblings; Diabetes: Yes - Father; Heart Disease: Yes - Father,Siblings; Hereditary Spherocytosis: No; Hypertension: Yes - Father,Siblings; Kidney Disease: No; Lung Disease: Yes - Siblings; Seizures: No; Stroke: No; Thyroid Problems: No; Tuberculosis: No; Never smoker; Marital Status - Married; Alcohol  Use: Never; Drug Use: No History; Caffeine Use: Never; Financial Concerns: No; Food, Clothing or Shelter Needs: No; Support System Lacking: No; Transportation Concerns: No; Advanced Directives: No; Patient does not want information on Advanced Directives; Medical Power of Attorney: Yes - [name] (Copy provided) Physician Affirmation I have reviewed and agree with the above information. Electronic Signature(s) Signed: 02/12/2018 3:03:07 PM By: Lenda Kelp PA-C Signed: 02/14/2018 4:15:18 PM By: Renne Crigler Entered By: Lenda Kelp on 02/12/2018 11:27:05 [name] ([id number]) -------------------------------------------------------------------------------- SuperBill Details Patient Name: [name] Date of Service: 02/12/2018 Medical Record Number: [id number] Patient Account Number: [id number] Date of Birth/Sex: 28-Apr-1930 (84 y.o. Female) Treating  RN: Renne Crigler Primary Care Provider: Aram Beecham Other Clinician: Referring Provider: Aram Beecham Treating Provider/Extender: Linwood Dibbles, HOYT Weeks in Treatment: 12 Diagnosis Coding ICD-10 Codes Code Description S81.811S Laceration without foreign body, right lower leg, sequela L97.812 Non-pressure chronic ulcer of other part of right lower leg with fat layer exposed Facility Procedures CPT4 Code: 82500370 Description: 48889 - WOUND CARE VISIT-LEV 2 EST PT Modifier: Quantity: 1 Physician Procedures CPT4 Code Description: 1694503 99213 - WC PHYS LEVEL 3 - EST PT ICD-10 Diagnosis Description S81.811S Laceration without foreign body, right lower leg, sequela L97.812 Non-pressure chronic ulcer of other part of right lower leg wi Modifier: th fat layer expo Quantity: 1 sed Electronic Signature(s) Signed: 02/12/2018 3:03:07 PM By: Lenda Kelp PA-C Entered By: Lenda Kelp on 02/12/2018 11:28:44

## 2018-02-16 NOTE — Progress Notes (Signed)
AALAYA, YADAO ([id number]) Visit Report for 02/12/2018 Arrival Information Details Patient Name: [name], [name] Date of Service: 02/12/2018 10:15 AM Medical Record Number: [id number] Patient Account Number: [id number] Date of Birth/Sex: [birth year] (84 y.o. Female) Treating RN: Ashok Cordia, Debi Primary Care Lorian Yaun: Aram Beecham Other Clinician: Referring Devin Foskey: Aram Beecham Treating Fenna Semel/Extender: Linwood Dibbles, HOYT Weeks in Treatment: 12 Visit Information History Since Last Visit All ordered tests and consults were completed: No Patient Arrived: Gilmer Mor Added or deleted any medications: No Arrival Time: 10:29 Any new allergies or adverse reactions: No Accompanied By: self Had a fall or experienced change in No Transfer Assistance: None activities of daily living that may affect Patient Identification Verified: Yes risk of falls: Secondary Verification Process Completed: Yes Signs or symptoms of abuse/neglect since last visito No Patient Requires Transmission-Based Precautions: No Hospitalized since last visit: No Patient Has Alerts: No Has Dressing in Place as Prescribed: Yes Pain Present Now: No Electronic Signature(s) Signed: 02/14/2018 4:15:20 PM By: Alejandro Mulling Entered By: Alejandro Mulling on 02/12/2018 10:29:35 [name] ([id number]) -------------------------------------------------------------------------------- Clinic Level of Care Assessment Details Patient Name: [name] Date of Service: 02/12/2018 10:15 AM Medical Record Number: [id number] Patient Account Number: [id number] Date of Birth/Sex: 1930-04-04 (84 y.o. Female) Treating RN: Renne Crigler Primary Care Neosha Switalski: Aram Beecham Other Clinician: Referring Graziella Connery: Aram Beecham Treating Kyelle Urbas/Extender: Linwood Dibbles, HOYT Weeks in Treatment: 12 Clinic Level of Care Assessment Items TOOL 4 Quantity Score []  - Use when only an EandM is performed on FOLLOW-UP visit 0 ASSESSMENTS -  Nursing Assessment / Reassessment X - Reassessment of Co-morbidities (includes updates in patient status) 1 10 X- 1 5 Reassessment of Adherence to Treatment Plan ASSESSMENTS - Wound and Skin Assessment / Reassessment X - Simple Wound Assessment / Reassessment - one wound 1 5 []  - 0 Complex Wound Assessment / Reassessment - multiple wounds []  - 0 Dermatologic / Skin Assessment (not related to wound area) ASSESSMENTS - Focused Assessment []  - Circumferential Edema Measurements - multi extremities 0 []  - 0 Nutritional Assessment / Counseling / Intervention []  - 0 Lower Extremity Assessment (monofilament, tuning fork, pulses) []  - 0 Peripheral Arterial Disease Assessment (using hand held doppler) ASSESSMENTS - Ostomy and/or Continence Assessment and Care []  - Incontinence Assessment and Management 0 []  - 0 Ostomy Care Assessment and Management (repouching, etc.) PROCESS - Coordination of Care X - Simple Patient / Family Education for ongoing care 1 15 []  - 0 Complex (extensive) Patient / Family Education for ongoing care []  - 0 Staff obtains Chiropractor, Records, Test Results / Process Orders []  - 0 Staff telephones HHA, Nursing Homes / Clarify orders / etc []  - 0 Routine Transfer to another Facility (non-emergent condition) []  - 0 Routine Hospital Admission (non-emergent condition) []  - 0 New Admissions / Manufacturing engineer / Ordering NPWT, Apligraf, etc. []  - 0 Emergency Hospital Admission (emergent condition) X- 1 10 Simple Discharge Coordination SAMYRIA, RUDIE ([id number]) []  - 0 Complex (extensive) Discharge Coordination PROCESS - Special Needs []  - Pediatric / Minor Patient Management 0 []  - 0 Isolation Patient Management []  - 0 Hearing / Language / Visual special needs []  - 0 Assessment of Community assistance (transportation, D/C planning, etc.) []  - 0 Additional assistance / Altered mentation []  - 0 Support Surface(s) Assessment (bed, cushion, seat,  etc.) INTERVENTIONS - Wound Cleansing / Measurement X - Simple Wound Cleansing - one wound 1 5 []  - 0 Complex Wound Cleansing - multiple wounds X- 1 5 Wound Imaging (photographs -  any number of wounds) []  - 0 Wound Tracing (instead of photographs) X- 1 5 Simple Wound Measurement - one wound []  - 0 Complex Wound Measurement - multiple wounds INTERVENTIONS - Wound Dressings X - Small Wound Dressing one or multiple wounds 1 10 []  - 0 Medium Wound Dressing one or multiple wounds []  - 0 Large Wound Dressing one or multiple wounds []  - 0 Application of Medications - topical []  - 0 Application of Medications - injection INTERVENTIONS - Miscellaneous []  - External ear exam 0 []  - 0 Specimen Collection (cultures, biopsies, blood, body fluids, etc.) []  - 0 Specimen(s) / Culture(s) sent or taken to Lab for analysis []  - 0 Patient Transfer (multiple staff / / Similar devices) []  - 0 Simple Staple / Suture removal (25 or less) []  - 0 Complex Staple / Suture removal (26 or more) []  - 0 Hypo / Hyperglycemic Management (close monitor of Blood Glucose) []  - 0 Ankle / Brachial Index (ABI) - do not check if billed separately X- 1 5 Vital Signs BEYA, TIPPS ( ) Has the patient been seen at the hospital within the last three years: Yes Total Score: 75 Level Of Care: New/Established - Level 2 Electronic Signature(s) Signed: 02/14/2018 4:15:18 PM By: Entered By: on 02/12/2018 10:54:51 ( ) -------------------------------------------------------------------------------- Encounter Discharge Information Details Patient Name: Date of Service: 02/12/2018 10:15 AM Medical Record Number: Patient Account Number: Date of Birth/Sex: December 30, 1929 (84 y.o. Female) Treating RN: [name] Primary Care Eriona Kinchen: [id number] Other Clinician: Referring Maygan Koeller: 04/16/2018 Treating Serafino Burciaga/Extender: Renne Crigler, HOYT Weeks in Treatment: 12 Encounter Discharge Information Items Schedule Follow-up Appointment: No Medication Reconciliation completed and No provided to Patient/Care Montserrat Shek: Patient Clinical Summary of Care: Declined Electronic Signature(s) Signed: 02/15/2018 11:42:25 AM By: 04/14/2018 Entered By: [name] on 02/12/2018 10:59:46 [name] (04/14/2018) -------------------------------------------------------------------------------- Lower Extremity Assessment Details Patient Name: [id number] Date of Service: 02/12/2018 10:15 AM Medical Record Number: 13/05/[id number] Patient Account Number: [id number] Date of Birth/Sex: 1930/01/17 (84 y.o. Female) Treating RN: Aram Beecham, Debi Primary Care Brick Ketcher: Linwood Dibbles Other Clinician: Referring Jameriah Trotti: 04/17/2018 Treating Rebekka Lobello/Extender: Gwenlyn Perking, HOYT Weeks in Treatment: 12 Vascular Assessment Pulses: Dorsalis Pedis Palpable: [Right:Yes] Posterior Tibial Extremity colors, hair growth, and conditions: Extremity Color: [Right:Hyperpigmented] Temperature of Extremity: [Right:Warm] Capillary Refill: [Right:< 3 seconds] Toe Nail Assessment Left: Right: Thick: No Discolored: No Deformed: No Improper Length and Hygiene: Yes Electronic Signature(s) Signed: 02/14/2018 4:15:20 PM By: 04/14/2018 Entered By: [name] on 02/12/2018 10:37:57 [name] (04/14/2018) -------------------------------------------------------------------------------- Multi Wound Chart Details Patient Name: [id number] Date of Service: 02/12/2018 10:15 AM Medical Record Number: 13/05/[id number] Patient Account Number: [id number] Date of Birth/Sex: 12/20/1929 (84 y.o. Female) Treating RN: Aram Beecham Primary Care Eupha Lobb: Linwood Dibbles Other Clinician: Referring Kamyia Thomason: 04/16/2018 Treating Jhamari Markowicz/Extender: Alejandro Mulling, HOYT Weeks in Treatment: 12 Vital  Signs Height(in): 60 Pulse(bpm): 71 Weight(lbs): 123 Blood Pressure(mmHg): 131/50 Body Mass Index(BMI): 24 Temperature(F): 98.0 Respiratory Rate 16 (breaths/min): Photos: [5:No Photos] [6:No Photos] [N/A:N/A] Wound Location: [5:Right Lower Leg - Anterior] [6:Right Lower Leg - Posterior] [N/A:N/A] Wounding Event: [5:Trauma] [6:Trauma] [N/A:N/A] Primary Etiology: [5:Trauma, Other] [6:Skin Tear] [N/A:N/A] Comorbid History: [5:Hypertension, Gout, Osteoarthritis, Neuropathy] [6:Hypertension, Gout, Osteoarthritis, Neuropathy] [N/A:N/A] Date Acquired: [5:09/05/2017] [6:01/26/2018] [N/A:N/A] Weeks of Treatment: [5:12] [6:1] [N/A:N/A] Wound Status: [5:Open] [6:Open] [N/A:N/A] Measurements L x W x D [5:0.8x0.7x0.1] [6:0.4x0.3x0.1] [N/A:N/A] (cm) Area (cm) : [5:0.44] [6:0.094] [N/A:N/A] Volume (cm) : [  5:0.044] [6:0.009] [N/A:N/A] % Reduction in Area: [5:66.60%] [6:96.90%] [N/A:N/A] % Reduction in Volume: [5:66.70%] [6:97.00%] [N/A:N/A] Classification: [5:Full Thickness With Exposed Support Structures] [6:Partial Thickness] [N/A:N/A] Exudate Amount: [5:Large] [6:None Present] [N/A:N/A] Exudate Type: [5:Purulent] [6:N/A] [N/A:N/A] Exudate Color: [5:yellow, brown, green] [6:N/A] [N/A:N/A] Wound Margin: [5:Distinct, outline attached] [6:Flat and Intact] [N/A:N/A] Granulation Amount: [5:Large (67-100%)] [6:None Present (0%)] [N/A:N/A] Granulation Quality: [5:Red, Hyper-granulation] [6:N/A] [N/A:N/A] Necrotic Amount: [5:Small (1-33%)] [6:Large (67-100%)] [N/A:N/A] Necrotic Tissue: [5:Adherent Slough] [6:Eschar] [N/A:N/A] Exposed Structures: [5:Fat Layer (Subcutaneous Tissue) Exposed: Yes Fascia: No Tendon: No Muscle: No Joint: No Bone: No] [6:Fascia: No Fat Layer (Subcutaneous Tissue) Exposed: No Tendon: No Muscle: No Joint: No Bone: No] [N/A:N/A] Epithelialization: [5:Small (1-33%)] [6:Medium (34-66%)] [N/A:N/A] Periwound Skin Texture: [5:Induration: Yes Scarring: Yes Excoriation: No Callus:  No] [6:Excoriation: No Induration: No Callus: No Crepitus: No] [N/A:N/A] Crepitus: No Rash: No Rash: No Scarring: No Periwound Skin Moisture: Maceration: No Maceration: No N/A Dry/Scaly: No Dry/Scaly: No Periwound Skin Color: Atrophie Blanche: No Atrophie Blanche: No N/A Cyanosis: No Cyanosis: No Ecchymosis: No Ecchymosis: No Erythema: No Erythema: No Hemosiderin Staining: No Hemosiderin Staining: No Mottled: No Mottled: No Pallor: No Pallor: No Rubor: No Rubor: No Temperature: No Abnormality No Abnormality N/A Tenderness on Palpation: Yes Yes N/A Wound Preparation: Ulcer Cleansing: Ulcer Cleansing: N/A Rinsed/Irrigated with Saline Rinsed/Irrigated with Saline Topical Anesthetic Applied: Topical Anesthetic Applied: Other: lidocaine 4% Other: lidocaine 4% Treatment Notes Electronic Signature(s) Signed: 02/14/2018 4:15:18 PM By: Renne Crigler Entered By: Renne Crigler on 02/12/2018 10:51:01 [name] ([id number]) -------------------------------------------------------------------------------- Multi-Disciplinary Care Plan Details Patient Name: [name] Date of Service: 02/12/2018 10:15 AM Medical Record Number: [id number] Patient Account Number: [id number] Date of Birth/Sex: 1930/10/14 (84 y.o. Female) Treating RN: Renne Crigler Primary Care Tonantzin Mimnaugh: Aram Beecham Other Clinician: Referring Juan Kissoon: Aram Beecham Treating Paisleigh Maroney/Extender: Linwood Dibbles, HOYT Weeks in Treatment: 12 Active Inactive ` Orientation to the Wound Care Program Nursing Diagnoses: Knowledge deficit related to the wound healing center program Goals: Patient/caregiver will verbalize understanding of the Wound Healing Center Program Date Initiated: 11/14/2017 Target Resolution Date: 11/28/2017 Goal Status: Active Interventions: Provide education on orientation to the wound center Notes: ` Wound/Skin Impairment Nursing Diagnoses: Impaired tissue  integrity Knowledge deficit related to ulceration/compromised skin integrity Goals: Patient/caregiver will verbalize understanding of skin care regimen Date Initiated: 11/14/2017 Target Resolution Date: 11/28/2017 Goal Status: Active Ulcer/skin breakdown will have a volume reduction of 30% by week 4 Date Initiated: 11/14/2017 Target Resolution Date: 11/28/2017 Goal Status: Active Interventions: Assess patient/caregiver ability to obtain necessary supplies Assess patient/caregiver ability to perform ulcer/skin care regimen upon admission and as needed Assess ulceration(s) every visit Treatment Activities: Skin care regimen initiated : 11/14/2017 Notes: Electronic Signature(s) Signed: 02/14/2018 4:15:18 PM By: Lenore Cordia, Lawernce Keas ([id number]) Entered By: Renne Crigler on 02/12/2018 10:50:52 [name] ([id number]) -------------------------------------------------------------------------------- Pain Assessment Details Patient Name: [name] Date of Service: 02/12/2018 10:15 AM Medical Record Number: [id number] Patient Account Number: [id number] Date of Birth/Sex: Mar 10, 1930 (84 y.o. Female) Treating RN: Ashok Cordia, Debi Primary Care Cassian Torelli: Aram Beecham Other Clinician: Referring Korene Dula: Aram Beecham Treating Clifton Kovacic/Extender: Linwood Dibbles, HOYT Weeks in Treatment: 12 Active Problems Location of Pain Severity and Description of Pain Patient Has Paino No Site Locations Pain Management and Medication Current Pain Management: Electronic Signature(s) Signed: 02/14/2018 4:15:20 PM By: Alejandro Mulling Entered By: Alejandro Mulling on 02/12/2018 10:29:42 [name] ([id number]) -------------------------------------------------------------------------------- Wound Assessment Details Patient Name: [name] Date of Service: 02/12/2018 10:15 AM Medical Record Number: [id number] Patient  Account Number: [id number] Date of Birth/Sex: 1930/11/01 (84  y.o. Female) Treating RN: Ashok Cordia, Debi Primary Care Deyanna Mctier: Aram Beecham Other Clinician: Referring Georgeanna Radziewicz: Aram Beecham Treating Maxey Ransom/Extender: Linwood Dibbles, HOYT Weeks in Treatment: 12 Wound Status Wound Number: 5 Primary Etiology: Trauma, Other Wound Location: Right Lower Leg - Anterior Wound Status: Open Wounding Event: Trauma Comorbid Hypertension, Gout, Osteoarthritis, History: Neuropathy Date Acquired: 09/05/2017 Weeks Of Treatment: 12 Clustered Wound: No Photos Photo Uploaded By: Renne Crigler on 02/12/2018 11:22:48 Wound Measurements Length: (cm) 0.8 Width: (cm) 0.7 Depth: (cm) 0.1 Area: (cm) 0.44 Volume: (cm) 0.044 % Reduction in Area: 66.6% % Reduction in Volume: 66.7% Epithelialization: Small (1-33%) Tunneling: No Undermining: No Wound Description Full Thickness With Exposed Support Foul Classification: Structures Slou Wound Margin: Distinct, outline attached Exudate Large Amount: Exudate Type: Purulent Exudate Color: yellow, brown, green Odor After Cleansing: No gh/Fibrino Yes Wound Bed Granulation Amount: Large (67-100%) Exposed Structure Granulation Quality: Red, Hyper-granulation Fascia Exposed: No Necrotic Amount: Small (1-33%) Fat Layer (Subcutaneous Tissue) Exposed: Yes Necrotic Quality: Adherent Slough Tendon Exposed: No Muscle Exposed: No Joint Exposed: No Bone Exposed: No SHAKEIA, LAMPKIN V. ([id number]) Periwound Skin Texture Texture Color No Abnormalities Noted: No No Abnormalities Noted: No Callus: No Atrophie Blanche: No Crepitus: No Cyanosis: No Excoriation: No Ecchymosis: No Induration: Yes Erythema: No Rash: No Hemosiderin Staining: No Scarring: Yes Mottled: No Pallor: No Moisture Rubor: No No Abnormalities Noted: No Dry / Scaly: No Temperature / Pain Maceration: No Temperature: No Abnormality Tenderness on Palpation: Yes Wound Preparation Ulcer Cleansing: Rinsed/Irrigated with Saline Topical  Anesthetic Applied: Other: lidocaine 4%, Electronic Signature(s) Signed: 02/14/2018 4:15:20 PM By: Alejandro Mulling Entered By: Alejandro Mulling on 02/12/2018 10:37:32 [name] ([id number]) -------------------------------------------------------------------------------- Wound Assessment Details Patient Name: [name] Date of Service: 02/12/2018 10:15 AM Medical Record Number: [id number] Patient Account Number: [id number] Date of Birth/Sex: [birth year] (84 y.o. Female) Treating RN: Ashok Cordia, Debi Primary Care Eloyce Bultman: Aram Beecham Other Clinician: Referring Sherice Ijames: Aram Beecham Treating Lekia Nier/Extender: Linwood Dibbles, HOYT Weeks in Treatment: 12 Wound Status Wound Number: 6 Primary Etiology: Skin Tear Wound Location: Right Lower Leg - Posterior Wound Status: Open Wounding Event: Trauma Comorbid Hypertension, Gout, Osteoarthritis, History: Neuropathy Date Acquired: 01/26/2018 Weeks Of Treatment: 1 Clustered Wound: No Photos Photo Uploaded By: Renne Crigler on 02/12/2018 11:23:03 Wound Measurements Length: (cm) 0.4 Width: (cm) 0.3 Depth: (cm) 0.1 Area: (cm) 0.094 Volume: (cm) 0.009 % Reduction in Area: 96.9% % Reduction in Volume: 97% Epithelialization: Medium (34-66%) Tunneling: No Undermining: No Wound Description Classification: Partial Thickness Wound Margin: Flat and Intact Exudate Amount: None Present Foul Odor After Cleansing: No Slough/Fibrino No Wound Bed Granulation Amount: None Present (0%) Exposed Structure Necrotic Amount: Large (67-100%) Fascia Exposed: No Necrotic Quality: Eschar Fat Layer (Subcutaneous Tissue) Exposed: No Tendon Exposed: No Muscle Exposed: No Joint Exposed: No Bone Exposed: No Periwound Skin Texture Texture Color No Abnormalities Noted: No No Abnormalities Noted: No LORIAH, DELIRA ([id number]) Callus: No Atrophie Blanche: No Crepitus: No Cyanosis: No Excoriation: No Ecchymosis: No Induration:  No Erythema: No Rash: No Hemosiderin Staining: No Scarring: No Mottled: No Pallor: No Moisture Rubor: No No Abnormalities Noted: No Dry / Scaly: No Temperature / Pain Maceration: No Temperature: No Abnormality Tenderness on Palpation: Yes Wound Preparation Ulcer Cleansing: Rinsed/Irrigated with Saline Topical Anesthetic Applied: Other: lidocaine 4%, Electronic Signature(s) Signed: 02/14/2018 4:15:20 PM By: Alejandro Mulling Entered By: Alejandro Mulling on 02/12/2018 10:35:49 [name] ([id number]) -------------------------------------------------------------------------------- Vitals Details Patient Name: [name] Date of Service: 02/12/2018 10:15  AM Medical Record Number: [id number] Patient Account Number: [id number] Date of Birth/Sex: February 09, 1930 (84 y.o. Female) Treating RN: Ashok Cordia, Debi Primary Care Maverick Dieudonne: Aram Beecham Other Clinician: Referring Diamond Martucci: Aram Beecham Treating Jp Eastham/Extender: Linwood Dibbles, HOYT Weeks in Treatment: 12 Vital Signs Time Taken: 10:29 Temperature (F): 98.0 Height (in): 60 Pulse (bpm): 71 Weight (lbs): 123 Respiratory Rate (breaths/min): 16 Body Mass Index (BMI): 24 Blood Pressure (mmHg): 131/50 Reference Range: 80 - 120 mg / dl Electronic Signature(s) Signed: 02/14/2018 4:15:20 PM By: Alejandro Mulling Entered By: Alejandro Mulling on 02/12/2018 10:31:20

## 2018-02-20 ENCOUNTER — Encounter: Payer: Medicare Other | Admitting: Physician Assistant

## 2018-02-20 ENCOUNTER — Ambulatory Visit: Payer: Medicare Other | Admitting: Physical Therapy

## 2018-02-20 DIAGNOSIS — L97812 Non-pressure chronic ulcer of other part of right lower leg with fat layer exposed: Secondary | ICD-10-CM | POA: Diagnosis not present

## 2018-02-22 NOTE — Progress Notes (Signed)
Natasha, Barnett ([id number]) Visit Report for 02/20/2018 Arrival Information Details Patient Name: [name], [name] Date of Service: 02/20/2018 1:45 PM Medical Record Number: [id number] Patient Account Number: [id number] Date of Birth/Sex: 1930/08/09 (84 y.o. Female) Treating RN: Curtis Sites Primary Care Luverne Farone: Aram Beecham Other Clinician: Referring Leshawn Straka: Aram Beecham Treating Orion Mole/Extender: Linwood Dibbles, HOYT Weeks in Treatment: 14 Visit Information History Since Last Visit Added or deleted any medications: No Patient Arrived: Cane Any new allergies or adverse reactions: No Arrival Time: 13:53 Had a fall or experienced change in No Accompanied By: self activities of daily living that may affect Transfer Assistance: None risk of falls: Patient Identification Verified: Yes Signs or symptoms of abuse/neglect since last visito No Secondary Verification Process Completed: Yes Hospitalized since last visit: No Patient Requires Transmission-Based Precautions: No Has Dressing in Place as Prescribed: Yes Patient Has Alerts: No Pain Present Now: No Electronic Signature(s) Signed: 02/20/2018 4:54:11 PM By: Curtis Sites Entered By: Curtis Sites on 02/20/2018 13:57:26 [name] ([id number]) -------------------------------------------------------------------------------- Clinic Level of Care Assessment Details Patient Name: [name] Date of Service: 02/20/2018 1:45 PM Medical Record Number: [id number] Patient Account Number: [id number] Date of Birth/Sex: 03-02-1930 (84 y.o. Female) Treating RN: Ashok Cordia, Debi Primary Care Adaysha Dubinsky: Aram Beecham Other Clinician: Referring Darol Cush: Aram Beecham Treating Charlayne Vultaggio/Extender: Linwood Dibbles, HOYT Weeks in Treatment: 14 Clinic Level of Care Assessment Items TOOL 4 Quantity Score X - Use when only an EandM is performed on FOLLOW-UP visit 1 0 ASSESSMENTS - Nursing Assessment / Reassessment X - Reassessment of  Co-morbidities (includes updates in patient status) 1 10 X- 1 5 Reassessment of Adherence to Treatment Plan ASSESSMENTS - Wound and Skin Assessment / Reassessment []  - Simple Wound Assessment / Reassessment - one wound 0 X- 2 5 Complex Wound Assessment / Reassessment - multiple wounds []  - 0 Dermatologic / Skin Assessment (not related to wound area) ASSESSMENTS - Focused Assessment []  - Circumferential Edema Measurements - multi extremities 0 []  - 0 Nutritional Assessment / Counseling / Intervention []  - 0 Lower Extremity Assessment (monofilament, tuning fork, pulses) []  - 0 Peripheral Arterial Disease Assessment (using hand held doppler) ASSESSMENTS - Ostomy and/or Continence Assessment and Care []  - Incontinence Assessment and Management 0 []  - 0 Ostomy Care Assessment and Management (repouching, etc.) PROCESS - Coordination of Care X - Simple Patient / Family Education for ongoing care 1 15 []  - 0 Complex (extensive) Patient / Family Education for ongoing care []  - 0 Staff obtains Chiropractor, Records, Test Results / Process Orders []  - 0 Staff telephones HHA, Nursing Homes / Clarify orders / etc []  - 0 Routine Transfer to another Facility (non-emergent condition) []  - 0 Routine Hospital Admission (non-emergent condition) []  - 0 New Admissions / Manufacturing engineer / Ordering NPWT, Apligraf, etc. []  - 0 Emergency Hospital Admission (emergent condition) X- 1 10 Simple Discharge Coordination ZABRIA, LISS ([id number]) []  - 0 Complex (extensive) Discharge Coordination PROCESS - Special Needs []  - Pediatric / Minor Patient Management 0 []  - 0 Isolation Patient Management []  - 0 Hearing / Language / Visual special needs []  - 0 Assessment of Community assistance (transportation, D/C planning, etc.) []  - 0 Additional assistance / Altered mentation []  - 0 Support Surface(s) Assessment (bed, cushion, seat, etc.) INTERVENTIONS - Wound Cleansing / Measurement []  -  Simple Wound Cleansing - one wound 0 X- 2 5 Complex Wound Cleansing - multiple wounds []  - 0 Wound Imaging (photographs - any number of wounds) X- 1 5 Wound Tracing (  instead of photographs) X- 1 5 Simple Wound Measurement - one wound []  - 0 Complex Wound Measurement - multiple wounds INTERVENTIONS - Wound Dressings X - Small Wound Dressing one or multiple wounds 1 10 []  - 0 Medium Wound Dressing one or multiple wounds []  - 0 Large Wound Dressing one or multiple wounds X- 1 5 Application of Medications - topical []  - 0 Application of Medications - injection INTERVENTIONS - Miscellaneous []  - External ear exam 0 []  - 0 Specimen Collection (cultures, biopsies, blood, body fluids, etc.) []  - 0 Specimen(s) / Culture(s) sent or taken to Lab for analysis []  - 0 Patient Transfer (multiple staff / Lift / Similar devices) []  - 0 Simple Staple / Suture removal (25 or less) []  - 0 Complex Staple / Suture removal (26 or more) []  - 0 Hypo / Hyperglycemic Management (close monitor of Blood Glucose) []  - 0 Ankle / Brachial Index (ABI) - do not check if billed separately X- 1 5 Vital Signs CONSETTA, COSNER ( ) Has the patient been seen at the hospital within the last three years: Yes Total Score: 90 Level Of Care: New/Established - Level 3 Electronic Signature(s) Signed: 02/21/2018 8:38:15 AM By: Entered By: on 02/20/2018 14:40:23 (Michiel Sites) -------------------------------------------------------------------------------- Encounter Discharge Information Details Patient Name: Date of Service: 02/20/2018 1:45 PM Medical Record Number: Patient Account Number: Date of Birth/Sex: [birth year] (84 y.o. Female) Treating RN: 02/23/2018 Primary Care Emya Picado: Alejandro Mulling Other Clinician: Referring Yuchen Fedor: Alejandro Mulling Treating Sesar Madewell/Extender: 04/22/2018, HOYT Weeks in  Treatment: 14 Encounter Discharge Information Items Discharge Pain Level: 0 Discharge Condition: Stable Ambulatory Status: Cane Discharge Destination: Home Private Transportation: Auto Accompanied By: self Schedule Follow-up Appointment: Yes Medication Reconciliation completed and provided No to Patient/Care Tyjae Issa: Clinical Summary of Care: Electronic Signature(s) Signed: 02/21/2018 8:28:46 AM By: [id number] Entered By: [name] on 02/20/2018 14:37:13 [id number] ([id number]) -------------------------------------------------------------------------------- Lower Extremity Assessment Details Patient Name: 13/05/1930 Date of Service: 02/20/2018 1:45 PM Medical Record Number: Renne Crigler Patient Account Number: Aram Beecham Date of Birth/Sex: 08-25-1930 (84 y.o. Female) Treating RN: 02/23/2018 Primary Care Joelly Bolanos: Renne Crigler Other Clinician: Referring Ranay Ketter: Renne Crigler Treating Jalyn Rosero/Extender: 04/22/2018, HOYT Weeks in Treatment: 14 Vascular Assessment Pulses: Dorsalis Pedis Palpable: [Right:Yes] Posterior Tibial Extremity colors, hair growth, and conditions: Extremity Color: [Right:Hyperpigmented] Hair Growth on Extremity: [Right:No] Temperature of Extremity: [Right:Warm] Capillary Refill: [Right:< 3 seconds] Electronic Signature(s) Signed: 02/20/2018 4:54:11 PM By: [id number] Entered By: [name] on 02/20/2018 14:05:29 [id number] ([id number]) -------------------------------------------------------------------------------- Multi Wound Chart Details Patient Name: 13/05/1930 Date of Service: 02/20/2018 1:45 PM Medical Record Number: Curtis Sites Patient Account Number: Aram Beecham Date of Birth/Sex: 11-11-1930 (84 y.o. Female) Treating RN: 04/22/2018, Debi Primary Care Coreena Rubalcava: Curtis Sites Other Clinician: Referring Rogue Pautler: Curtis Sites Treating Zykee Avakian/Extender: 04/22/2018, HOYT Weeks in Treatment:  14 Vital Signs Height(in): 60 Pulse(bpm): 74 Weight(lbs): 123 Blood Pressure(mmHg): 130/46 Body Mass Index(BMI): 24 Temperature(F): 98.2 Respiratory Rate 18 (breaths/min): Photos: [5:No Photos] [6:No Photos] [N/A:N/A] Wound Location: [5:Right Lower Leg - Anterior] [6:Right Lower Leg - Posterior] [N/A:N/A] Wounding Event: [5:Trauma] [6:Trauma] [N/A:N/A] Primary Etiology: [5:Trauma, Other] [6:Skin Tear] [N/A:N/A] Comorbid History: [5:Hypertension, Gout, Osteoarthritis, Neuropathy] [6:Hypertension, Gout, Osteoarthritis, Neuropathy] [N/A:N/A] Date Acquired: [5:09/05/2017] [6:01/26/2018] [N/A:N/A] Weeks of Treatment: [5:14] [6:3] [N/A:N/A] Wound Status: [5:Open] [6:Open] [N/A:N/A] Measurements L x W x D [5:0.7x0.6x0.1] [6:0.3x0.2x0.1] [N/A:N/A] (cm) Area (cm) : [5:0.33] [6:0.047] [N/A:N/A] Volume (cm) : [5:0.033] [6:0.005] [N/A:N/A] %  Reduction in Area: [5:75.00%] [6:98.40%] [N/A:N/A] % Reduction in Volume: [5:75.00%] [6:98.30%] [N/A:N/A] Classification: [5:Full Thickness With Exposed Support Structures] [6:Partial Thickness] [N/A:N/A] Exudate Amount: [5:Large] [6:None Present] [N/A:N/A] Exudate Type: [5:Purulent] [6:N/A] [N/A:N/A] Exudate Color: [5:yellow, brown, green] [6:N/A] [N/A:N/A] Wound Margin: [5:Distinct, outline attached] [6:Flat and Intact] [N/A:N/A] Granulation Amount: [5:Large (67-100%)] [6:Large (67-100%)] [N/A:N/A] Granulation Quality: [5:Red, Hyper-granulation] [6:Red] [N/A:N/A] Necrotic Amount: [5:Small (1-33%)] [6:None Present (0%)] [N/A:N/A] Exposed Structures: [5:Fat Layer (Subcutaneous Tissue) Exposed: Yes Fascia: No Tendon: No Muscle: No Joint: No Bone: No] [6:Fascia: No Fat Layer (Subcutaneous Tissue) Exposed: No Tendon: No Muscle: No Joint: No Bone: No] [N/A:N/A] Epithelialization: [5:Small (1-33%)] [6:Medium (34-66%)] [N/A:N/A] Periwound Skin Texture: [5:Induration: Yes Scarring: Yes Excoriation: No Callus: No] [6:Excoriation: No Induration: No Callus: No  Crepitus: No] [N/A:N/A] Crepitus: No Rash: No Rash: No Scarring: No Periwound Skin Moisture: Maceration: No Maceration: No N/A Dry/Scaly: No Dry/Scaly: No Periwound Skin Color: Atrophie Blanche: No Atrophie Blanche: No N/A Cyanosis: No Cyanosis: No Ecchymosis: No Ecchymosis: No Erythema: No Erythema: No Hemosiderin Staining: No Hemosiderin Staining: No Mottled: No Mottled: No Pallor: No Pallor: No Rubor: No Rubor: No Temperature: No Abnormality No Abnormality N/A Tenderness on Palpation: Yes Yes N/A Wound Preparation: Ulcer Cleansing: Ulcer Cleansing: N/A Rinsed/Irrigated with Saline Rinsed/Irrigated with Saline Topical Anesthetic Applied: Topical Anesthetic Applied: Other: lidocaine 4% Other: lidocaine 4% Treatment Notes Electronic Signature(s) Signed: 02/21/2018 8:38:15 AM By: Alejandro Mulling Entered By: Alejandro Mulling on 02/20/2018 14:19:03 [name] ([id number]) -------------------------------------------------------------------------------- Multi-Disciplinary Care Plan Details Patient Name: [name] Date of Service: 02/20/2018 1:45 PM Medical Record Number: [id number] Patient Account Number: [id number] Date of Birth/Sex: 1930/02/23 (84 y.o. Female) Treating RN: Ashok Cordia, Debi Primary Care Jarissa Sheriff: Aram Beecham Other Clinician: Referring Lataysha Vohra: Aram Beecham Treating Tikia Skilton/Extender: Linwood Dibbles, HOYT Weeks in Treatment: 14 Active Inactive ` Orientation to the Wound Care Program Nursing Diagnoses: Knowledge deficit related to the wound healing center program Goals: Patient/caregiver will verbalize understanding of the Wound Healing Center Program Date Initiated: 11/14/2017 Target Resolution Date: 11/28/2017 Goal Status: Active Interventions: Provide education on orientation to the wound center Notes: ` Wound/Skin Impairment Nursing Diagnoses: Impaired tissue integrity Knowledge deficit related to ulceration/compromised  skin integrity Goals: Patient/caregiver will verbalize understanding of skin care regimen Date Initiated: 11/14/2017 Target Resolution Date: 11/28/2017 Goal Status: Active Ulcer/skin breakdown will have a volume reduction of 30% by week 4 Date Initiated: 11/14/2017 Target Resolution Date: 11/28/2017 Goal Status: Active Interventions: Assess patient/caregiver ability to obtain necessary supplies Assess patient/caregiver ability to perform ulcer/skin care regimen upon admission and as needed Assess ulceration(s) every visit Treatment Activities: Skin care regimen initiated : 11/14/2017 Notes: Electronic Signature(s) Signed: 02/21/2018 8:38:15 AM By: Vira Agar, Lawernce Keas ([id number]) Entered By: Alejandro Mulling on 02/20/2018 14:18:52 [name] ([id number]) -------------------------------------------------------------------------------- Pain Assessment Details Patient Name: [name] Date of Service: 02/20/2018 1:45 PM Medical Record Number: [id number] Patient Account Number: [id number] Date of Birth/Sex: [birth year] (84 y.o. Female) Treating RN: Curtis Sites Primary Care Araseli Sherry: Aram Beecham Other Clinician: Referring Berl Bonfanti: Aram Beecham Treating Daianna Vasques/Extender: Linwood Dibbles, HOYT Weeks in Treatment: 14 Active Problems Location of Pain Severity and Description of Pain Patient Has Paino No Site Locations Pain Management and Medication Current Pain Management: Notes Topical or injectable lidocaine is offered to patient for acute pain when surgical debridement is performed. If needed, Patient is instructed to use over the counter pain medication for the following 24-48 hours after debridement. Wound care MDs do not prescribed pain medications. Patient has chronic pain or uncontrolled  pain. Patient has been instructed to make an appointment with their Primary Care Physician for pain management. Electronic Signature(s) Signed: 02/20/2018 4:54:11 PM  By: Curtis Sites Entered By: Curtis Sites on 02/20/2018 13:57:38 [name] ([id number]) -------------------------------------------------------------------------------- Patient/Caregiver Education Details Patient Name: [name] Date of Service: 02/20/2018 1:45 PM Medical Record Number: [id number] Patient Account Number: [id number] Date of Birth/Gender: 04-22-1930 (84 y.o. Female) Treating RN: Renne Crigler Primary Care Physician: Aram Beecham Other Clinician: Referring Physician: Aram Beecham Treating Physician/Extender: Skeet Simmer in Treatment: 14 Education Assessment Education Provided To: Patient Education Topics Provided Wound/Skin Impairment: Handouts: Caring for Your Ulcer Methods: Explain/Verbal Responses: State content correctly Electronic Signature(s) Signed: 02/21/2018 8:28:46 AM By: Renne Crigler Entered By: Renne Crigler on 02/20/2018 14:37:26 [name] ([id number]) -------------------------------------------------------------------------------- Wound Assessment Details Patient Name: [name] Date of Service: 02/20/2018 1:45 PM Medical Record Number: [id number] Patient Account Number: [id number] Date of Birth/Sex: Apr 19, 1930 (84 y.o. Female) Treating RN: Curtis Sites Primary Care Meriem Lemieux: Aram Beecham Other Clinician: Referring Patrina Andreas: Aram Beecham Treating Joelee Snoke/Extender: Linwood Dibbles, HOYT Weeks in Treatment: 14 Wound Status Wound Number: 5 Primary Etiology: Trauma, Other Wound Location: Right Lower Leg - Anterior Wound Status: Open Wounding Event: Trauma Comorbid Hypertension, Gout, Osteoarthritis, History: Neuropathy Date Acquired: 09/05/2017 Weeks Of Treatment: 14 Clustered Wound: No Photos Photo Uploaded By: Curtis Sites on 02/20/2018 14:25:04 Wound Measurements Length: (cm) 0.7 Width: (cm) 0.6 Depth: (cm) 0.1 Area: (cm) 0.33 Volume: (cm) 0.033 % Reduction in Area: 75% %  Reduction in Volume: 75% Epithelialization: Small (1-33%) Tunneling: No Undermining: No Wound Description Full Thickness With Exposed Support Foul O Classification: Structures Slough Wound Margin: Distinct, outline attached Exudate Large Amount: Exudate Type: Purulent Exudate Color: yellow, brown, green dor After Cleansing: No /Fibrino Yes Wound Bed Granulation Amount: Large (67-100%) Exposed Structure Granulation Quality: Red, Hyper-granulation Fascia Exposed: No Necrotic Amount: Small (1-33%) Fat Layer (Subcutaneous Tissue) Exposed: Yes Necrotic Quality: Adherent Slough Tendon Exposed: No Muscle Exposed: No Joint Exposed: No Bone Exposed: No TARRIA, ASKEY V. ([id number]) Periwound Skin Texture Texture Color No Abnormalities Noted: No No Abnormalities Noted: No Callus: No Atrophie Blanche: No Crepitus: No Cyanosis: No Excoriation: No Ecchymosis: No Induration: Yes Erythema: No Rash: No Hemosiderin Staining: No Scarring: Yes Mottled: No Pallor: No Moisture Rubor: No No Abnormalities Noted: No Dry / Scaly: No Temperature / Pain Maceration: No Temperature: No Abnormality Tenderness on Palpation: Yes Wound Preparation Ulcer Cleansing: Rinsed/Irrigated with Saline Topical Anesthetic Applied: Other: lidocaine 4%, Treatment Notes Wound #5 (Right, Anterior Lower Leg) 1. Cleansed with: Clean wound with Normal Saline 2. Anesthetic Topical Lidocaine 4% cream to wound bed prior to debridement 3. Peri-wound Care: Barrier cream 4. Dressing Applied: Prisma Ag 5. Secondary Dressing Applied Bordered Foam Dressing Electronic Signature(s) Signed: 02/20/2018 4:54:11 PM By: Curtis Sites Entered By: Curtis Sites on 02/20/2018 14:04:13 [name] ([id number]) -------------------------------------------------------------------------------- Wound Assessment Details Patient Name: [name] Date of Service: 02/20/2018 1:45 PM Medical Record Number:  [id number] Patient Account Number: [id number] Date of Birth/Sex: 05-14-1930 (84 y.o. Female) Treating RN: Ashok Cordia, Debi Primary Care Sahily Biddle: Aram Beecham Other Clinician: Referring Makaila Windle: Aram Beecham Treating Darrien Laakso/Extender: Linwood Dibbles, HOYT Weeks in Treatment: 14 Wound Status Wound Number: 6 Primary Etiology: Skin Tear Wound Location: Right Lower Leg - Posterior Wound Status: Healed - Epithelialized Wounding Event: Trauma Comorbid Hypertension, Gout, Osteoarthritis, History: Neuropathy Date Acquired: 01/26/2018 Weeks Of Treatment: 3 Clustered Wound: No Photos Photo Uploaded By: Curtis Sites on 02/20/2018 14:25:05 Wound Measurements Length: (cm) 0 %  Redu Width: (cm) 0 % Redu Depth: (cm) 0 Epithe Area: (cm) 0 Tunne Volume: (cm) 0 Under ction in Area: 100% ction in Volume: 100% lialization: Large (67-100%) ling: No mining: No Wound Description Classification: Partial Thickness Foul O Wound Margin: Flat and Intact Slough Exudate Amount: None Present dor After Cleansing: No /Fibrino No Wound Bed Granulation Amount: None Present (0%) Exposed Structure Necrotic Amount: None Present (0%) Fascia Exposed: No Fat Layer (Subcutaneous Tissue) Exposed: No Tendon Exposed: No Muscle Exposed: No Joint Exposed: No Bone Exposed: No Periwound Skin Texture Texture Color No Abnormalities Noted: No No Abnormalities Noted: No MYRL, BYNUM ([id number]) Callus: No Atrophie Blanche: No Crepitus: No Cyanosis: No Excoriation: No Ecchymosis: No Induration: No Erythema: No Rash: No Hemosiderin Staining: No Scarring: No Mottled: No Pallor: No Moisture Rubor: No No Abnormalities Noted: No Dry / Scaly: No Temperature / Pain Maceration: No Temperature: No Abnormality Tenderness on Palpation: Yes Wound Preparation Ulcer Cleansing: Rinsed/Irrigated with Saline Topical Anesthetic Applied: None Electronic Signature(s) Signed: 02/21/2018 8:38:15 AM By:  Alejandro Mulling Entered By: Alejandro Mulling on 02/20/2018 14:19:40 [name] ([id number]) -------------------------------------------------------------------------------- Vitals Details Patient Name: [name] Date of Service: 02/20/2018 1:45 PM Medical Record Number: [id number] Patient Account Number: [id number] Date of Birth/Sex: 1930/05/11 (84 y.o. Female) Treating RN: Curtis Sites Primary Care Hoke Baer: Aram Beecham Other Clinician: Referring Biana Haggar: Aram Beecham Treating Raigen Jagielski/Extender: Linwood Dibbles, HOYT Weeks in Treatment: 14 Vital Signs Time Taken: 13:57 Temperature (F): 98.2 Height (in): 60 Pulse (bpm): 74 Weight (lbs): 123 Respiratory Rate (breaths/min): 18 Body Mass Index (BMI): 24 Blood Pressure (mmHg): 130/46 Reference Range: 80 - 120 mg / dl Electronic Signature(s) Signed: 02/20/2018 4:54:11 PM By: Curtis Sites Entered By: Curtis Sites on 02/20/2018 14:00:05

## 2018-02-22 NOTE — Progress Notes (Signed)
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 Follow-up Appointments: Wound #5 Right,Anterior Lower Leg: Return Appointment in 1 week. Additional Orders / Instructions: Wound #5 Right,Anterior Lower Leg: Increase protein intake. The following medication(s) was prescribed: lidocaine topical 4 % cream 1 1 cream topical was prescribed at facility I am going to recommend that we continue with the Current wound care measures per above. Patient is in agreement with plan. Hopefully she will continue to show signs of improvement week by week. Please see above for specific wound care orders. We will see patient for re-evaluation in 1 week(s) here in the clinic. If anything worsens or changes patient will contact our office for additional recommendations. Electronic Signature(s) Signed: 02/21/2018 12:09:36 AM By: Lenda Kelp PA-C Entered By: Lenda Kelp on 02/20/2018 23:32:35 [name] ([id number]) -------------------------------------------------------------------------------- ROS/PFSH Details Patient Name: [name] Date of Service:  02/20/2018 1:45 PM Medical Record Number: [id number] Patient Account Number: [id number] Date of Birth/Sex: [birth year] (84 y.o. Female) Treating RN: Ashok Cordia, Debi Primary Care Provider: Aram Beecham Other Clinician: Referring Provider: Aram Beecham Treating Provider/Extender: Linwood Dibbles, Malakie Balis Weeks in Treatment: 14 Information Obtained From Patient Wound History Do you currently have one or more open woundso Yes How many open wounds do you currently haveo 1 Approximately how long have you had your woundso 10 weeks How have you been treating your wound(s) until nowo mupirocin Has your wound(s) ever healed and then re-openedo No Have you had any lab work done in the past montho No Have you tested positive for an antibiotic resistant organism (MRSA, VRE)o No Have you tested positive for osteomyelitis (bone infection)o No Have you had any tests for circulation on your legso Yes Who ordered the testo dr schnier [facility] Have you had other problems associated with your woundso Swelling Constitutional Symptoms (General Health) Complaints and Symptoms: Negative for: Fever; Chills Medical History: Past Medical History Notes: Back pain Eyes Medical History: Negative for: Cataracts; Glaucoma; Optic Neuritis Ear/Nose/Mouth/Throat Medical History: Negative for: Chronic sinus problems/congestion; Middle ear problems Past Medical History Notes: bilateral hearing aides Hematologic/Lymphatic Medical History: Negative for: Anemia; Hemophilia; Human Immunodeficiency Virus; Lymphedema; Sickle Cell Disease Respiratory Complaints and Symptoms: No Complaints or Symptoms Medical History: Negative for: Aspiration; Asthma; Chronic Obstructive Pulmonary Disease (COPD); Pneumothorax; Sleep Apnea; [name], [name] ([id number]) Tuberculosis Cardiovascular Complaints and Symptoms: No Complaints or Symptoms Medical History: Positive for: Hypertension Negative for: Angina;  Arrhythmia; Congestive Heart Failure; Coronary Artery Disease; Deep Vein Thrombosis; Hypotension; Myocardial Infarction; Peripheral Arterial Disease; Peripheral Venous Disease; Phlebitis; Vasculitis Past Medical History Notes: pacemaker Gastrointestinal Medical History: Negative for: Cirrhosis ; Colitis; Crohnos; Hepatitis A; Hepatitis B; Hepatitis C Endocrine Medical History: Negative for: Type I Diabetes; Type II Diabetes Genitourinary Medical History: Negative for: End Stage Renal Disease Immunological Medical History: Negative for: Lupus Erythematosus; Raynaudos; Scleroderma Musculoskeletal Medical History: Positive for: Gout; Osteoarthritis Negative for: Rheumatoid Arthritis; Osteomyelitis Neurologic Medical History: Positive for: Neuropathy - lower legs Negative for: Dementia; Quadriplegia; Paraplegia; Seizure Disorder Oncologic Medical History: Negative for: Received Chemotherapy;  Received Radiation Psychiatric Complaints and Symptoms: No Complaints or Symptoms Medical History: Negative for: Anorexia/bulimia; Confinement Anxiety [name], [name] ([id number]) Immunizations Pneumococcal Vaccine: Received Pneumococcal Vaccination: Yes Implantable Devices Family and Social History Cancer: Yes - Mother,Siblings; Diabetes: Yes - Father; Heart Disease: Yes - Father,Siblings; Hereditary Spherocytosis: No; Hypertension: Yes - Father,Siblings; Kidney Disease: No; Lung Disease: Yes - Siblings; Seizures: No; Stroke: No; Thyroid Problems: No; Tuberculosis: No; Never smoker; Marital Status - Married; Alcohol Use: Never; Drug Use: No History; Caffeine Use: Never; Financial Concerns: No; Food, Clothing or Shelter Needs: No; Support System Lacking: No; Transportation Concerns: No; Advanced Directives: No; Patient does not want information on Advanced Directives; Medical Power of Attorney: Yes - Valyn Latchford (Copy provided) Physician Affirmation I have reviewed and agree with the above  information. Electronic Signature(s) Signed: 02/21/2018 12:09:36 AM By: Lenda Kelp PA-C Signed: 02/21/2018 8:38:15 AM By: Alejandro Mulling Entered By: Lenda Kelp on 02/20/2018 23:31:32 [name] ([id number]) -------------------------------------------------------------------------------- SuperBill Details Patient Name: [name] Date of Service: 02/20/2018 Medical Record Number: [id number] Patient Account Number: [id number] Date of Birth/Sex: Jun 15, 1930 (84 y.o. Female) Treating RN: Ashok Cordia, Debi Primary Care Provider: Aram Beecham Other Clinician: Referring Provider: Aram Beecham Treating Provider/Extender: Linwood Dibbles, Gottfried Standish Weeks in Treatment: 14 Diagnosis Coding ICD-10 Codes Code Description S81.811S Laceration without foreign body, right lower leg, sequela L97.812 Non-pressure chronic ulcer of other part of right lower leg with fat layer exposed Facility Procedures CPT4 Code: 14239532 Description: 99213 - WOUND CARE VISIT-LEV 3 EST PT Modifier: Quantity: 1 Physician Procedures CPT4 Code Description: 0233435 99213 - WC PHYS LEVEL 3 - EST PT ICD-10 Diagnosis Description S81.811S Laceration without foreign body, right lower leg, sequela L97.812 Non-pressure chronic ulcer of other part of right lower leg wi Modifier: th fat layer expo Quantity: 1 sed Electronic Signature(s) Signed: 02/21/2018 12:09:36 AM By: Lenda Kelp PA-C Entered By: Lenda Kelp on 02/20/2018 23:32:53

## 2018-02-26 ENCOUNTER — Encounter: Payer: Medicare Other | Admitting: Physician Assistant

## 2018-02-26 DIAGNOSIS — L97812 Non-pressure chronic ulcer of other part of right lower leg with fat layer exposed: Secondary | ICD-10-CM | POA: Diagnosis not present

## 2018-02-27 ENCOUNTER — Ambulatory Visit: Payer: Medicare Other | Admitting: Physical Therapy

## 2018-02-27 DIAGNOSIS — R2681 Unsteadiness on feet: Secondary | ICD-10-CM | POA: Diagnosis not present

## 2018-02-27 NOTE — Therapy (Signed)
[facility] [address], [place], [address] Phone: [phone]   Fax:  [phone]  Physical Therapy Treatment  Patient Details  Name: [name] MRN: [id number] Date of Birth: 1930-03-14 Referring Provider: Aram Beecham, MD   Encounter Date: 02/27/2018  PT End of Session - 02/27/18 1132    Visit Number  12    Number of Visits  21    Date for PT Re-Evaluation  03/07/18    PT Start Time  1030    PT Stop Time  1117    PT Time Calculation (min)  47 min    Equipment Utilized During Treatment  Gait belt    Activity Tolerance  Patient tolerated treatment well;Patient limited by fatigue    Behavior During Therapy  [facility] for tasks assessed/performed       Past Medical History:  Diagnosis Date  . Anemia   . GERD (gastroesophageal reflux disease)   . Hypertension   . Syncope     No past surgical history on file.  There were no vitals filed for this visit.  Subjective Assessment - 02/27/18 1034    Subjective  Pt reports she fell 2/6 and that she "did not break anything" but that she saw her PCP yesterday and that he "aspirated a lot of blood from her elbow" and that she is having trouble with that. She reported she had pain in her back from a bruise from her shoulder blade to her bottom, that has gotten better following increased cryotherapy. Patient reports she reached backward for the handrail on her porch while it was raining outside and she missed the rail falling backward on her brick porch. Patient reports since her fall her pain is greatly reduced, that she is still having trouble with her elbow (which she is being followed for at [facility] clinic), and reports she reports her "balance is not good". Patient reports she has made an appointment with neurology next week, and reports her balance is "shaky" and she reports she has just been able to take her dog out into the yard over the past couple weeks, but  that she feels much less confident walking in her community and has not been out as much since her fall.  She reports her children have been helping with her husband as his dementia is becoming difficult for her to handle on her own in her current state. Patient reports over the past 2 weeks she has began doing "some exercises" including her bridges and step ups onto stair with use of rail    Pertinent History  Pt is well known to this clinic, just recently d/c from this clinic on 12/6 after being seen for R shoulder pain.  Pt reports that her balance is not good and finds herself unsteady at times.  Pt has had EMGs performed BUEs and BLEs which were both negative.  Pt reports she does have numbness in L foot up to ankle at all times.  Pt reports that her balance is worse when standing still, improves when she is moving. Goals: would like to be able to walk through the grass to check on her garden. Pt with h/o ACDF C4-6 on 04/19/17. Pt has a h/o 3 surgeries in her lumbar spine.     Limitations  Standing;Walking    How long can you sit comfortably?  1 hr    How long can you stand comfortably?  10 minutes    How  long can you walk comfortably?  2 or 3 blocks    Diagnostic tests  EMG testing BLEs and BUEs which were negative    Patient Stated Goals  To improve her balance, to be able to walk her dog    Pain Score  1     Pain Location  Back    Pain Orientation  Posterior;Upper;Mid;Lower    Pain Onset  More than a month ago    Pain Frequency  Occasional    Multiple Pain Sites  No            Ther-Ex -Education on machine use through wellness program for LE strengthening that is safe and feasible for patient goals.  -BERG balance assessment where PT provided supervision- CGA to patient with gait belt to ensure safety with all activity, noting increased difficulty with transitional and dynamic movements.  -5x STS test patient unable to perform without use of UE support, and PT giving patient supervision  to prevent LOB with gait belt. 2 trials leading to second trial with use of UE (1st trial patient was unable to stand following multiple attempts without UE) -Gait speed test where patient is able to demonstrate speed of .7m/s with cane without LOB or increased sway -Discussion with patient on stair ambulation in the home to prevent further falls                   PT Education - 02/27/18 1129    Education provided  Yes    Education Details  Educated on new POC approach; safety in balance exercises    Person(s) Educated  Patient    Methods  Explanation;Verbal cues    Comprehension  Verbalized understanding;Verbal cues required       PT Short Term Goals - 12/28/17 1546      PT SHORT TERM GOAL #1   Title  Pt will be independent with HEP for carryover between sessions    Time  2    Period  Weeks    Status  New        PT Long Term Goals - 02/27/18 1047      PT LONG TERM GOAL #1   Title  Pt will improve Berg score to at least 45/56 to demonstrate improved balance    Baseline  3/19 38/56    Time  6    Period  Weeks    Status  On-going      PT LONG TERM GOAL #2   Title  Pt will improve 5xSTS time to at least 15 seconds to demonstrate improved BLE strength and balance    Baseline  3/19: 19 sec using UE to stand    Time  6    Period  Weeks    Status  On-going      PT LONG TERM GOAL #3   Title  Pt will demonstrate BLE strength at least 4/5 throughout for improved functional mobility    Baseline  3/19: time did not allow for full assessment, but can assume strength less that 4/5 d/t inability to sit/stand without use of hands    Time  6    Period  Weeks    Status  On-going      PT LONG TERM GOAL #4   Title  Pt will be able to walk through her grass to check on her garden without LOB for improved QOL    Baseline  3/19 Unable    Time  6    Period  Weeks    Status  On-going      PT LONG TERM GOAL #5   Title  Pt will improve to at least 1.0 m/s for pt to  transition to community ambulator    Baseline  3/19: 0.92m/s    Time  6    Status  On-going      PT LONG TERM GOAL #6   Title  Pt will improve ABC scale by at least 30% to demonstrate decreased effect of impaired balance on daily activities    Baseline  43.13%    Time  6    Period  Weeks    Status  On-going            Plan - 02/27/18 1143    Clinical Impression Statement  Pt tolerated reaassessment tests and measures well with increased apprehension from previous assessment. Patient refused to attempt alternating foot placement on stool, and demonstrated increased difficulty with SLS and tandem stance, and dynamic activities such as turning and transferring; from prior assessment. Patient is unable to stand without UE support demonstrating decreased LE strength. Patient gait speed remained about the same on level ground, but patient continues to report she feels unsteady on grass. Patient reported she wants to be seen at the [facility] through [employer] or the Wellness program which PT reported would be good for her and provided her information on whom to get in touch with and what machines would be appropriate and safe for lower body strengthening. Patient discussed that d/t set back with recent fall she wanted to attend PT 2x/week for a few weeks until she started at wellness program and PT agreed this was feasable.     Rehab Potential  Good    PT Frequency  2x / week    PT Duration  6 weeks    PT Treatment/Interventions  ADLs/Self Care Home Management;Aquatic Therapy;Biofeedback;Cryotherapy;Electrical Stimulation;Iontophoresis 4mg /ml Dexamethasone;Moist Heat;Ultrasound;Parrafin;Fluidtherapy;Contrast Bath;DME Instruction;Gait training;Stair training;Functional mobility training;Therapeutic activities;Therapeutic exercise;Balance training;Neuromuscular re-education;Patient/family education;Orthotic Fit/Training;Manual techniques;Passive range of motion;Dry needling;Energy conservation;Taping     PT Next Visit Plan  Transition to balance and functional gait focus    PT Home Exercise Plan  Sit<>stand, standing hip ext and abd, bridging, and heel raises    Consulted and Agree with Plan of Care  Patient       Patient will benefit from skilled therapeutic intervention in order to improve the following deficits and impairments:  Abnormal gait, Decreased activity tolerance, Decreased balance, Decreased endurance, Decreased knowledge of use of DME, Decreased mobility, Decreased range of motion, Decreased safety awareness, Decreased strength, Difficulty walking, Impaired perceived functional ability, Impaired flexibility, Impaired sensation, Impaired UE functional use, Improper body mechanics, Postural dysfunction  Visit Diagnosis: Unsteadiness on feet     Problem List Patient Active Problem List   Diagnosis Date Noted  . Lymphedema 10/11/2017  . Chronic venous insufficiency 10/11/2017  . Leg pain 10/11/2017  . Leg swelling 10/11/2017  . COPD (chronic obstructive pulmonary disease) (HCC) 10/11/2017  . Essential hypertension 10/11/2017    Staci Acosta 02/27/2018, 12:12 PM  [place] [facility]ICAL AND SPORTS MEDICINE [address] [place], [address] Phone: [phone]   Fax:  [phone]  Name: [name] MRN: [id number] Date of Birth: 1930-01-23

## 2018-02-27 NOTE — Progress Notes (Signed)
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 Medical History: Negative for: Anemia; Hemophilia; Human Immunodeficiency Virus; Lymphedema; Sickle Cell Disease Respiratory Complaints and Symptoms: No Complaints or Symptoms Medical History: Negative for: Aspiration; Asthma; Chronic Obstructive Pulmonary Disease (COPD); Pneumothorax; Sleep Apnea; [name], [name] ([id number]) Tuberculosis Cardiovascular Complaints and Symptoms: No Complaints or Symptoms Medical History: Positive for: Hypertension Negative for: Angina; Arrhythmia; Congestive Heart Failure; Coronary Artery Disease; Deep Vein Thrombosis; Hypotension; Myocardial Infarction; Peripheral Arterial Disease; Peripheral Venous Disease; Phlebitis; Vasculitis Past Medical History Notes: pacemaker Gastrointestinal Medical History: Negative for: Cirrhosis ; Colitis; Crohnos; Hepatitis A; Hepatitis B; Hepatitis C Endocrine Medical History: Negative for: Type I Diabetes; Type II  Diabetes Genitourinary Medical History: Negative for: End Stage Renal Disease Immunological Medical History: Negative for: Lupus Erythematosus; Raynaudos; Scleroderma Musculoskeletal Medical History: Positive for: Gout; Osteoarthritis Negative for: Rheumatoid Arthritis; Osteomyelitis Neurologic Medical History: Positive for: Neuropathy - lower legs Negative for: Dementia; Quadriplegia; Paraplegia; Seizure Disorder Oncologic Medical History: Negative for: Received Chemotherapy; Received Radiation Psychiatric Complaints and Symptoms: No Complaints or Symptoms Medical History: Negative for: Anorexia/bulimia; Confinement Anxiety [name], [name] ([id number]) Immunizations Pneumococcal Vaccine: Received Pneumococcal Vaccination: Yes Implantable Devices Family and Social History Cancer: Yes - Mother,Siblings; Diabetes: Yes - Father; Heart Disease: Yes - Father,Siblings; Hereditary Spherocytosis: No; Hypertension: Yes - Father,Siblings; Kidney Disease: No; Lung Disease: Yes - Siblings; Seizures: No; Stroke: No; Thyroid Problems: No; Tuberculosis: No; Never smoker; Marital Status - Married; Alcohol Use: Never; Drug Use: No History; Caffeine Use: Never; Financial Concerns: No; Food, Clothing or Shelter Needs: No; Support System Lacking: No; Transportation Concerns: No; Advanced Directives: No; Patient does not want information on Advanced Directives; Medical Power of Attorney: Yes - [place] (Copy provided) Physician Affirmation I have reviewed and agree with the above information. Electronic Signature(s) Signed: 02/26/2018 4:59:35 PM By: Renne Crigler Signed: 02/27/2018 12:14:27 AM By: Lenda Kelp PA-C Entered By: Lenda Kelp on 02/26/2018 15:18:15 [name] ([id number]) -------------------------------------------------------------------------------- SuperBill Details Patient Name: [name] Date of Service: 02/26/2018 Medical Record Number:  [id number] Patient Account Number: [id number] Date of Birth/Sex: [birth year] (84 y.o. Female) Treating RN: Renne Crigler Primary Care Provider: Aram Beecham Other Clinician: Referring Provider: Aram Beecham Treating Provider/Extender: Linwood Dibbles, HOYT Weeks in Treatment: 14 Diagnosis Coding ICD-10 Codes Code Description S81.811S Laceration without foreign body, right lower leg, sequela L97.812 Non-pressure chronic ulcer of other part of right lower leg with fat layer exposed Facility Procedures CPT4 Code Description: 78938101 11042 - DEB SUBQ TISSUE 20 SQ CM/< ICD-10 Diagnosis Description L97.812 Non-pressure chronic ulcer of other part of right lower leg wit Modifier: h fat layer expo Quantity: 1 sed Physician Procedures CPT4 Code Description: 7510258 11042 - WC PHYS SUBQ TISS 20 SQ CM ICD-10 Diagnosis Description L97.812 Non-pressure chronic ulcer of other part of right lower leg wit Modifier: h fat layer expo Quantity: 1 sed Electronic Signature(s) Signed: 02/27/2018 12:14:27 AM By: Lenda Kelp PA-C Entered By: Lenda Kelp on 02/26/2018 15:19:22

## 2018-03-01 NOTE — Progress Notes (Signed)
Natasha, Barnett ([id number]) Visit Report for 02/26/2018 Arrival Information Details Patient Name: [name], [name] Date of Service: 02/26/2018 2:30 PM Medical Record Number: [id number] Patient Account Number: [id number] Date of Birth/Sex: 1930-06-12 (84 y.o. Female) Treating RN: Huel Coventry Primary Care Alese Furniss: Aram Beecham Other Clinician: Referring Saniyyah Elster: Aram Beecham Treating Luster Hechler/Extender: Linwood Dibbles, HOYT Weeks in Treatment: 14 Visit Information History Since Last Visit Added or deleted any medications: No Patient Arrived: [name] Any new allergies or adverse reactions: No Arrival Time: 14:35 Had a fall or experienced change in No Accompanied By: caregiver activities of daily living that may affect Transfer Assistance: None risk of falls: Patient Identification Verified: Yes Signs or symptoms of abuse/neglect since last visito No Secondary Verification Process Completed: Yes Hospitalized since last visit: No Patient Requires Transmission-Based No Has Dressing in Place as Prescribed: Yes Precautions: Pain Present Now: No Patient Has Alerts: No Electronic Signature(s) Signed: 02/26/2018 4:48:13 PM By: Elliot Gurney, BSN, RN, CWS, Kim RN, BSN Entered By: Elliot Gurney, BSN, RN, CWS, Kim on 02/26/2018 14:35:44 [name] ([id number]) -------------------------------------------------------------------------------- Encounter Discharge Information Details Patient Name: [name] Date of Service: 02/26/2018 2:30 PM Medical Record Number: [id number] Patient Account Number: [id number] Date of Birth/Sex: [birth year] (84 y.o. Female) Treating RN: Renne Crigler Primary Care Rashelle Ireland: Aram Beecham Other Clinician: Referring Quenesha Douglass: Aram Beecham Treating Damara Klunder/Extender: Linwood Dibbles, HOYT Weeks in Treatment: 14 Encounter Discharge Information Items Schedule Follow-up Appointment: No Medication Reconciliation completed and No provided to Patient/Care  Jemya Depierro: Patient Clinical Summary of Care: Declined Electronic Signature(s) Signed: 02/28/2018 10:32:15 AM By: Gwenlyn Perking Entered By: Gwenlyn Perking on 02/26/2018 15:00:19 [name] ([id number]) -------------------------------------------------------------------------------- Lower Extremity Assessment Details Patient Name: [name] Date of Service: 02/26/2018 2:30 PM Medical Record Number: [id number] Patient Account Number: [id number] Date of Birth/Sex: [birth year] (84 y.o. Female) Treating RN: Huel Coventry Primary Care Vinicio Lynk: Aram Beecham Other Clinician: Referring Annamary Buschman: Aram Beecham Treating Marvelene Stoneberg/Extender: Linwood Dibbles, HOYT Weeks in Treatment: 14 Vascular Assessment Claudication: Claudication Assessment [Right:None] Pulses: Dorsalis Pedis Palpable: [Right:Yes] Posterior Tibial Extremity colors, hair growth, and conditions: Extremity Color: [Right:Hyperpigmented] Hair Growth on Extremity: [Right:No] Temperature of Extremity: [Right:Warm] Capillary Refill: [Right:< 3 seconds] Electronic Signature(s) Signed: 02/26/2018 4:48:13 PM By: Elliot Gurney, BSN, RN, CWS, Kim RN, BSN Entered By: Elliot Gurney, BSN, RN, CWS, Kim on 02/26/2018 14:42:17 [name] ([id number]) -------------------------------------------------------------------------------- Multi Wound Chart Details Patient Name: [name] Date of Service: 02/26/2018 2:30 PM Medical Record Number: [id number] Patient Account Number: [id number] Date of Birth/Sex: 12/29/1929 (84 y.o. Female) Treating RN: Renne Crigler Primary Care Syrenity Klepacki: Aram Beecham Other Clinician: Referring Natassia Guthridge: Aram Beecham Treating Nyajah Hyson/Extender: Linwood Dibbles, HOYT Weeks in Treatment: 14 Vital Signs Height(in): 60 Pulse(bpm): 77 Weight(lbs): 123 Blood Pressure(mmHg): 117/47 Body Mass Index(BMI): 24 Temperature(F): 97.8 Respiratory Rate 16 (breaths/min): Photos: [5:No Photos] [N/A:N/A] Wound Location:  [5:Right Lower Leg - Anterior] [N/A:N/A] Wounding Event: [5:Trauma] [N/A:N/A] Primary Etiology: [5:Trauma, Other] [N/A:N/A] Comorbid History: [5:Hypertension, Gout, Osteoarthritis, Neuropathy] [N/A:N/A] Date Acquired: [5:09/05/2017] [N/A:N/A] Weeks of Treatment: [5:14] [N/A:N/A] Wound Status: [5:Open] [N/A:N/A] Measurements L x W x D [5:0.8x0.6x0.1] [N/A:N/A] (cm) Area (cm) : [5:0.377] [N/A:N/A] Volume (cm) : [5:0.038] [N/A:N/A] % Reduction in Area: [5:71.40%] [N/A:N/A] % Reduction in Volume: [5:71.20%] [N/A:N/A] Classification: [5:Full Thickness With Exposed Support Structures] [N/A:N/A] Exudate Amount: [5:Large] [N/A:N/A] Exudate Type: [5:Purulent] [N/A:N/A] Exudate Color: [5:yellow, brown, green] [N/A:N/A] Wound Margin: [5:Distinct, outline attached] [N/A:N/A] Granulation Amount: [5:Large (67-100%)] [N/A:N/A] Granulation Quality: [5:Red, Hyper-granulation] [N/A:N/A] Necrotic Amount: [5:Small (1-33%)] [N/A:N/A] Exposed Structures: [5:Fat Layer (Subcutaneous Tissue) Exposed: Yes Fascia:  No Tendon: No Muscle: No Joint: No Bone: No] [N/A:N/A] Epithelialization: [5:Small (1-33%)] [N/A:N/A] Periwound Skin Texture: [5:Induration: Yes Scarring: Yes Excoriation: No Callus: No] [N/A:N/A] Crepitus: No Rash: No Periwound Skin Moisture: Maceration: No N/A N/A Dry/Scaly: No Periwound Skin Color: Atrophie Blanche: No N/A N/A Cyanosis: No Ecchymosis: No Erythema: No Hemosiderin Staining: No Mottled: No Pallor: No Rubor: No Temperature: No Abnormality N/A N/A Tenderness on Palpation: Yes N/A N/A Wound Preparation: Ulcer Cleansing: N/A N/A Rinsed/Irrigated with Saline Topical Anesthetic Applied: Other: lidocaine 4% Treatment Notes Electronic Signature(s) Signed: 02/26/2018 4:59:35 PM By: Renne Crigler Entered By: Renne Crigler on 02/26/2018 14:50:54 [name]  ([id number]) -------------------------------------------------------------------------------- Multi-Disciplinary Care Plan Details Patient Name: [name] Date of Service: 02/26/2018 2:30 PM Medical Record Number: [id number] Patient Account Number: [id number] Date of Birth/Sex: Feb 07, 1930 (84 y.o. Female) Treating RN: Renne Crigler Primary Care Gizella [place]: Aram Beecham Other Clinician: Referring Ellisha Bankson: Aram Beecham Treating Cayci Mcnabb/Extender: Linwood Dibbles, HOYT Weeks in Treatment: 14 Active Inactive ` Orientation to the Wound Care Program Nursing Diagnoses: Knowledge deficit related to the wound healing center program Goals: Patient/caregiver will verbalize understanding of the Wound Healing Center Program Date Initiated: 11/14/2017 Target Resolution Date: 11/28/2017 Goal Status: Active Interventions: Provide education on orientation to the wound center Notes: ` Wound/Skin Impairment Nursing Diagnoses: Impaired tissue integrity Knowledge deficit related to ulceration/compromised skin integrity Goals: Patient/caregiver will verbalize understanding of skin care regimen Date Initiated: 11/14/2017 Target Resolution Date: 11/28/2017 Goal Status: Active Ulcer/skin breakdown will have a volume reduction of 30% by week 4 Date Initiated: 11/14/2017 Target Resolution Date: 11/28/2017 Goal Status: Active Interventions: Assess patient/caregiver ability to obtain necessary supplies Assess patient/caregiver ability to perform ulcer/skin care regimen upon admission and as needed Assess ulceration(s) every visit Treatment Activities: Skin care regimen initiated : 11/14/2017 Notes: Electronic Signature(s) Signed: 02/26/2018 4:59:35 PM By: Lenore Cordia, Lawernce Keas ([id number]) Entered By: Renne Crigler on 02/26/2018 14:50:43 [name] ([id number]) -------------------------------------------------------------------------------- Pain Assessment Details Patient  Name: [name] Date of Service: 02/26/2018 2:30 PM Medical Record Number: [id number] Patient Account Number: [id number] Date of Birth/Sex: 10/16/1930 (84 y.o. Female) Treating RN: Huel Coventry Primary Care Kiri Hinderliter: Aram Beecham Other Clinician: Referring Katelynd Blauvelt: Aram Beecham Treating Niveah Boerner/Extender: Linwood Dibbles, HOYT Weeks in Treatment: 14 Active Problems Location of Pain Severity and Description of Pain Patient Has Paino No Site Locations With Dressing Change: No Pain Management and Medication Current Pain Management: Goals for Pain Management Topical or injectable lidocaine is offered to patient for acute pain when surgical debridement is performed. If needed, Patient is instructed to use over the counter pain medication for the following 24-48 hours after debridement. Wound care MDs do not prescribed pain medications. Patient has chronic pain or uncontrolled pain. Patient has been instructed to make an appointment with their Primary Care Physician for pain management. Electronic Signature(s) Signed: 02/26/2018 4:48:13 PM By: Elliot Gurney, BSN, RN, CWS, Kim RN, BSN Entered By: Elliot Gurney, BSN, RN, CWS, Kim on 02/26/2018 14:35:53 [name] ([id number]) -------------------------------------------------------------------------------- Wound Assessment Details Patient Name: [name] Date of Service: 02/26/2018 2:30 PM Medical Record Number: [id number] Patient Account Number: [id number] Date of Birth/Sex: 04-Feb-1930 (84 y.o. Female) Treating RN: Huel Coventry Primary Care Briana Newman: Aram Beecham Other Clinician: Referring Tagen Brethauer: Aram Beecham Treating Traevon Meiring/Extender: Linwood Dibbles, HOYT Weeks in Treatment: 14 Wound Status Wound Number: 5 Primary Etiology: Trauma, Other Wound Location: Right Lower Leg - Anterior Wound Status: Open Wounding Event: Trauma Comorbid Hypertension, Gout, Osteoarthritis, History: Neuropathy Date Acquired: 09/05/2017 Weeks Of Treatment:  14 Clustered Wound: No Photos Photo Uploaded By: Elliot Gurney, BSN, RN, CWS, Kim on 02/26/2018 16:41:28 Wound Measurements Length: (cm) 0.8 Width: (cm) 0.6 Depth: (cm) 0.1 Area: (cm) 0.377 Volume: (cm) 0.038 % Reduction in Area: 71.4% % Reduction in Volume: 71.2% Epithelialization: Small (1-33%) Tunneling: No Undermining: No Wound Description Full Thickness With Exposed Support Foul Odor Classification: Structures Slough/Fi Wound Margin: Distinct, outline attached Exudate Large Amount: Exudate Type: Purulent Exudate Color: yellow, brown, green After Cleansing: No brino Yes Wound Bed Granulation Amount: Large (67-100%) Exposed Structure Granulation Quality: Red, Hyper-granulation Fascia Exposed: No Necrotic Amount: Small (1-33%) Fat Layer (Subcutaneous Tissue) Exposed: Yes Necrotic Quality: Adherent Slough Tendon Exposed: No Muscle Exposed: No Joint Exposed: No Bone Exposed: No Periwound Skin Texture Texture Color ZAYDEN, HAHNE V. ([id number]) No Abnormalities Noted: No No Abnormalities Noted: No Callus: No Atrophie Blanche: No Crepitus: No Cyanosis: No Excoriation: No Ecchymosis: No Induration: Yes Erythema: No Rash: No Hemosiderin Staining: No Scarring: Yes Mottled: No Pallor: No Moisture Rubor: No No Abnormalities Noted: No Dry / Scaly: No Temperature / Pain Maceration: No Temperature: No Abnormality Tenderness on Palpation: Yes Wound Preparation Ulcer Cleansing: Rinsed/Irrigated with Saline Topical Anesthetic Applied: Other: lidocaine 4%, Electronic Signature(s) Signed: 02/26/2018 4:48:13 PM By: Elliot Gurney, BSN, RN, CWS, Kim RN, BSN Entered By: Elliot Gurney, BSN, RN, CWS, Kim on 02/26/2018 14:41:00 [name] ([id number]) -------------------------------------------------------------------------------- Vitals Details Patient Name: [name] Date of Service: 02/26/2018 2:30 PM Medical Record Number: [id number] Patient Account Number:  [id number] Date of Birth/Sex: 1930/02/23 (84 y.o. Female) Treating RN: Huel Coventry Primary Care Ovidio Steele: Aram Beecham Other Clinician: Referring Aslan Himes: Aram Beecham Treating Deejay Koppelman/Extender: Linwood Dibbles, HOYT Weeks in Treatment: 14 Vital Signs Time Taken: 14:35 Temperature (F): 97.8 Height (in): 60 Pulse (bpm): 77 Weight (lbs): 123 Respiratory Rate (breaths/min): 16 Body Mass Index (BMI): 24 Blood Pressure (mmHg): 117/47 Reference Range: 80 - 120 mg / dl Electronic Signature(s) Signed: 02/26/2018 4:48:13 PM By: Elliot Gurney, BSN, RN, CWS, Kim RN, BSN Entered By: Elliot Gurney, BSN, RN, CWS, Kim on 02/26/2018 14:38:33

## 2018-03-05 ENCOUNTER — Encounter: Payer: Medicare Other | Admitting: Physician Assistant

## 2018-03-05 DIAGNOSIS — L97812 Non-pressure chronic ulcer of other part of right lower leg with fat layer exposed: Secondary | ICD-10-CM | POA: Diagnosis not present

## 2018-03-06 ENCOUNTER — Ambulatory Visit: Payer: Medicare Other | Admitting: Physical Therapy

## 2018-03-06 NOTE — Progress Notes (Signed)
ALLEIGH, MOLLICA ([id number]) Visit Report for 03/05/2018 Arrival Information Details Patient Name: [name], [name]. Date of Service: 03/05/2018 2:30 PM Medical Record Number: [id number] Patient Account Number: [id number] Date of Birth/Sex: 1930/04/07 (84 y.o. F) Treating RN: Phillis Haggis Primary Care Genna Casimir: Aram Beecham Other Clinician: Referring Kirat Mezquita: Aram Beecham Treating Helmi Hechavarria/Extender: Linwood Dibbles, HOYT Weeks in Treatment: 15 Visit Information History Since Last Visit All ordered tests and consults were completed: No Patient Arrived: Gilmer Mor Added or deleted any medications: No Arrival Time: 14:39 Any new allergies or adverse reactions: No Accompanied By: self Had a fall or experienced change in No Transfer Assistance: EasyPivot Patient activities of daily living that may affect Lift risk of falls: Patient Identification Verified: Yes Signs or symptoms of abuse/neglect since last visito No Secondary Verification Process Yes Hospitalized since last visit: No Completed: Implantable device outside of the clinic excluding No Patient Requires Transmission-Based No cellular tissue based products placed in the center Precautions: since last visit: Patient Has Alerts: No Has Dressing in Place as Prescribed: Yes Pain Present Now: No Electronic Signature(s) Signed: 03/05/2018 4:28:49 PM By: Alejandro Mulling Entered By: Alejandro Mulling on 03/05/2018 14:40:13 [name] ([id number]) -------------------------------------------------------------------------------- Encounter Discharge Information Details Patient Name: [name] Date of Service: 03/05/2018 2:30 PM Medical Record Number: [id number] Patient Account Number: [id number] Date of Birth/Sex: 10-01-1930 (84 y.o. F) Treating RN: Curtis Sites Primary Care Mailey Landstrom: Aram Beecham Other Clinician: Referring Shyasia Funches: Aram Beecham Treating Norene Oliveri/Extender: Linwood Dibbles, HOYT Weeks in Treatment:  15 Encounter Discharge Information Items Discharge Pain Level: 0 Discharge Condition: Stable Ambulatory Status: Cane Discharge Destination: Home Private Transportation: Auto Accompanied By: self Schedule Follow-up Appointment: Yes Medication Reconciliation completed and provided No to Patient/Care Orean Giarratano: Clinical Summary of Care: Electronic Signature(s) Signed: 03/05/2018 4:46:08 PM By: Curtis Sites Entered By: Curtis Sites on 03/05/2018 15:14:15 [name] ([id number]) -------------------------------------------------------------------------------- Lower Extremity Assessment Details Patient Name: [name] Date of Service: 03/05/2018 2:30 PM Medical Record Number: [id number] Patient Account Number: [id number] Date of Birth/Sex: 03-Feb-1930 (84 y.o. F) Treating RN: Phillis Haggis Primary Care Darneshia Demary: Aram Beecham Other Clinician: Referring Codi Kertz: Aram Beecham Treating Ayriana Wix/Extender: Linwood Dibbles, HOYT Weeks in Treatment: 15 Vascular Assessment Pulses: Dorsalis Pedis Palpable: [Right:Yes] Posterior Tibial Extremity colors, hair growth, and conditions: Extremity Color: [Right:Hyperpigmented] Temperature of Extremity: [Right:Warm] Capillary Refill: [Right:< 3 seconds] Toe Nail Assessment Left: Right: Thick: No Discolored: No Deformed: No Improper Length and Hygiene: No Electronic Signature(s) Signed: 03/05/2018 4:28:49 PM By: Alejandro Mulling Entered By: Alejandro Mulling on 03/05/2018 14:46:05 [name] ([id number]) -------------------------------------------------------------------------------- Multi Wound Chart Details Patient Name: [name] Date of Service: 03/05/2018 2:30 PM Medical Record Number: [id number] Patient Account Number: [id number] Date of Birth/Sex: 08-12-1930 (84 y.o. F) Treating RN: Renne Crigler Primary Care Mccauley Diehl: Aram Beecham Other Clinician: Referring Essie Gehret: Aram Beecham Treating  Cloy Cozzens/Extender: Linwood Dibbles, HOYT Weeks in Treatment: 15 Vital Signs Height(in): 60 Pulse(bpm): 71 Weight(lbs): 123 Blood Pressure(mmHg): 140/51 Body Mass Index(BMI): 24 Temperature(F): 97.6 Respiratory Rate 18 (breaths/min): Photos: [5:No Photos] [N/A:N/A] Wound Location: [5:Right Lower Leg - Anterior] [N/A:N/A] Wounding Event: [5:Trauma] [N/A:N/A] Primary Etiology: [5:Trauma, Other] [N/A:N/A] Comorbid History: [5:Hypertension, Gout, Osteoarthritis, Neuropathy] [N/A:N/A] Date Acquired: [5:09/05/2017] [N/A:N/A] Weeks of Treatment: [5:15] [N/A:N/A] Wound Status: [5:Open] [N/A:N/A] Measurements L x W x D [5:0.7x0.5x0.1] [N/A:N/A] (cm) Area (cm) : [5:0.275] [N/A:N/A] Volume (cm) : [5:0.027] [N/A:N/A] % Reduction in Area: [5:79.20%] [N/A:N/A] % Reduction in Volume: [5:79.50%] [N/A:N/A] Classification: [5:Full Thickness With Exposed Support Structures] [N/A:N/A] Exudate Amount: [5:Large] [N/A:N/A] Exudate Type: [5:Serosanguineous] [  N/A:N/A] Exudate Color: [5:red, brown] [N/A:N/A] Wound Margin: [5:Distinct, outline attached] [N/A:N/A] Granulation Amount: [5:Large (67-100%)] [N/A:N/A] Granulation Quality: [5:Red, Hyper-granulation] [N/A:N/A] Necrotic Amount: [5:Small (1-33%)] [N/A:N/A] Exposed Structures: [5:Fat Layer (Subcutaneous Tissue) Exposed: Yes Fascia: No Tendon: No Muscle: No Joint: No Bone: No] [N/A:N/A] Epithelialization: [5:Small (1-33%)] [N/A:N/A] Periwound Skin Texture: [5:Induration: Yes Scarring: Yes Excoriation: No Callus: No] [N/A:N/A] Crepitus: No Rash: No Periwound Skin Moisture: Maceration: No N/A N/A Dry/Scaly: No Periwound Skin Color: Atrophie Blanche: No N/A N/A Cyanosis: No Ecchymosis: No Erythema: No Hemosiderin Staining: No Mottled: No Pallor: No Rubor: No Temperature: No Abnormality N/A N/A Tenderness on Palpation: Yes N/A N/A Wound Preparation: Ulcer Cleansing: N/A N/A Rinsed/Irrigated with Saline Topical Anesthetic  Applied: Other: lidocaine 4% Treatment Notes Electronic Signature(s) Signed: 03/05/2018 4:23:05 PM By: Renne Crigler Entered By: Renne Crigler on 03/05/2018 15:00:25 [name] ([id number]) -------------------------------------------------------------------------------- Multi-Disciplinary Care Plan Details Patient Name: [name] Date of Service: 03/05/2018 2:30 PM Medical Record Number: [id number] Patient Account Number: [id number] Date of Birth/Sex: 1930/06/20 (84 y.o. F) Treating RN: Renne Crigler Primary Care Lamere Lightner: Aram Beecham Other Clinician: Referring Sadeel Fiddler: Aram Beecham Treating Evyn Putzier/Extender: Linwood Dibbles, HOYT Weeks in Treatment: 15 Active Inactive ` Orientation to the Wound Care Program Nursing Diagnoses: Knowledge deficit related to the wound healing center program Goals: Patient/caregiver will verbalize understanding of the Wound Healing Center Program Date Initiated: 11/14/2017 Target Resolution Date: 11/28/2017 Goal Status: Active Interventions: Provide education on orientation to the wound center Notes: ` Wound/Skin Impairment Nursing Diagnoses: Impaired tissue integrity Knowledge deficit related to ulceration/compromised skin integrity Goals: Patient/caregiver will verbalize understanding of skin care regimen Date Initiated: 11/14/2017 Target Resolution Date: 11/28/2017 Goal Status: Active Ulcer/skin breakdown will have a volume reduction of 30% by week 4 Date Initiated: 11/14/2017 Target Resolution Date: 11/28/2017 Goal Status: Active Interventions: Assess patient/caregiver ability to obtain necessary supplies Assess patient/caregiver ability to perform ulcer/skin care regimen upon admission and as needed Assess ulceration(s) every visit Treatment Activities: Skin care regimen initiated : 11/14/2017 Notes: Electronic Signature(s) Signed: 03/05/2018 4:23:05 PM By: Lenore Cordia, Lawernce Keas ([id number]) Entered  By: Renne Crigler on 03/05/2018 15:00:16 [name] ([id number]) -------------------------------------------------------------------------------- Pain Assessment Details Patient Name: [name] Date of Service: 03/05/2018 2:30 PM Medical Record Number: [id number] Patient Account Number: [id number] Date of Birth/Sex: 1930-10-30 (84 y.o. F) Treating RN: Phillis Haggis Primary Care Jade Burright: Aram Beecham Other Clinician: Referring Rabiah Goeser: Aram Beecham Treating Trajan Grove/Extender: Linwood Dibbles, HOYT Weeks in Treatment: 15 Active Problems Location of Pain Severity and Description of Pain Patient Has Paino No Site Locations Pain Management and Medication Current Pain Management: Electronic Signature(s) Signed: 03/05/2018 4:28:49 PM By: Alejandro Mulling Entered By: Alejandro Mulling on 03/05/2018 14:40:19 [name] ([id number]) -------------------------------------------------------------------------------- Patient/Caregiver Education Details Patient Name: [name] Date of Service: 03/05/2018 2:30 PM Medical Record Number: [id number] Patient Account Number: [id number] Date of Birth/Gender: September 15, 1930 (84 y.o. F) Treating RN: Curtis Sites Primary Care Physician: Aram Beecham Other Clinician: Referring Physician: Aram Beecham Treating Physician/Extender: Skeet Simmer in Treatment: 15 Education Assessment Education Provided To: Patient Education Topics Provided Wound/Skin Impairment: Handouts: Other: wound care as ordered Methods: Demonstration, Explain/Verbal Responses: State content correctly Electronic Signature(s) Signed: 03/05/2018 4:46:08 PM By: Curtis Sites Entered By: Curtis Sites on 03/05/2018 15:14:31 [name] ([id number]) -------------------------------------------------------------------------------- Wound Assessment Details Patient Name: [name] Date of Service: 03/05/2018 2:30 PM Medical Record Number:  [id number] Patient Account Number: [id number] Date of Birth/Sex: 1930-12-03 (84 y.o. F) Treating RN: Phillis Haggis Primary Care  Majesty Stehlin: Aram Beecham Other Clinician: Referring Kalif Kattner: Aram Beecham Treating Tacoya Altizer/Extender: Linwood Dibbles, HOYT Weeks in Treatment: 15 Wound Status Wound Number: 5 Primary Etiology: Trauma, Other Wound Location: Right Lower Leg - Anterior Wound Status: Open Wounding Event: Trauma Comorbid Hypertension, Gout, Osteoarthritis, History: Neuropathy Date Acquired: 09/05/2017 Weeks Of Treatment: 15 Clustered Wound: No Photos Photo Uploaded By: Alejandro Mulling on 03/05/2018 15:56:22 Wound Measurements Length: (cm) 0.7 Width: (cm) 0.5 Depth: (cm) 0.1 Area: (cm) 0.275 Volume: (cm) 0.027 % Reduction in Area: 79.2% % Reduction in Volume: 79.5% Epithelialization: Small (1-33%) Tunneling: No Undermining: No Wound Description Full Thickness With Exposed Support Foul Classification: Structures Slou Wound Margin: Distinct, outline attached Exudate Large Amount: Exudate Type: Serosanguineous Exudate Color: red, brown Odor After Cleansing: No gh/Fibrino Yes Wound Bed Granulation Amount: Large (67-100%) Exposed Structure Granulation Quality: Red, Hyper-granulation Fascia Exposed: No Necrotic Amount: Small (1-33%) Fat Layer (Subcutaneous Tissue) Exposed: Yes Necrotic Quality: Adherent Slough Tendon Exposed: No Muscle Exposed: No Joint Exposed: No Bone Exposed: No Periwound Skin Texture JELANI, HLAVATY V. ([id number]) Texture Color No Abnormalities Noted: No No Abnormalities Noted: No Callus: No Atrophie Blanche: No Crepitus: No Cyanosis: No Excoriation: No Ecchymosis: No Induration: Yes Erythema: No Rash: No Hemosiderin Staining: No Scarring: Yes Mottled: No Pallor: No Moisture Rubor: No No Abnormalities Noted: No Dry / Scaly: No Temperature / Pain Maceration: No Temperature: No Abnormality Tenderness on Palpation:  Yes Wound Preparation Ulcer Cleansing: Rinsed/Irrigated with Saline Topical Anesthetic Applied: Other: lidocaine 4%, Treatment Notes Wound #5 (Right, Anterior Lower Leg) 1. Cleansed with: Clean wound with Normal Saline 2. Anesthetic Topical Lidocaine 4% cream to wound bed prior to debridement 4. Dressing Applied: Prisma Ag 5. Secondary Dressing Applied Bordered Foam Dressing Dry Gauze Electronic Signature(s) Signed: 03/05/2018 4:28:49 PM By: Alejandro Mulling Entered By: Alejandro Mulling on 03/05/2018 14:45:20 [name] ([id number]) -------------------------------------------------------------------------------- Vitals Details Patient Name: [name] Date of Service: 03/05/2018 2:30 PM Medical Record Number: [id number] Patient Account Number: [id number] Date of Birth/Sex: 22-May-1930 (84 y.o. F) Treating RN: Phillis Haggis Primary Care Danene Montijo: Aram Beecham Other Clinician: Referring Nour Rodrigues: Aram Beecham Treating Mahira Petras/Extender: Linwood Dibbles, HOYT Weeks in Treatment: 15 Vital Signs Time Taken: 14:40 Temperature (F): 97.6 Height (in): 60 Pulse (bpm): 71 Weight (lbs): 123 Respiratory Rate (breaths/min): 18 Body Mass Index (BMI): 24 Blood Pressure (mmHg): 140/51 Reference Range: 80 - 120 mg / dl Electronic Signature(s) Signed: 03/05/2018 4:28:49 PM By: Alejandro Mulling Entered By: Alejandro Mulling on 03/05/2018 14:42:03

## 2018-03-07 ENCOUNTER — Ambulatory Visit: Payer: Medicare Other | Admitting: Physical Therapy

## 2018-03-08 ENCOUNTER — Encounter: Payer: Medicare Other | Admitting: Physical Therapy

## 2018-03-08 NOTE — Progress Notes (Signed)
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 problems associated with your woundso Swelling Constitutional Symptoms (General Health) Complaints and Symptoms: Negative for: Fever; Chills Medical History: Past Medical History Notes: Back pain Eyes Medical History: Negative for: Cataracts; Glaucoma; Optic Neuritis Ear/Nose/Mouth/Throat Medical History: Negative for: Chronic sinus problems/congestion; Middle ear problems Past Medical History Notes: bilateral hearing aides Hematologic/Lymphatic Medical History: Negative for: Anemia; Hemophilia; Human Immunodeficiency Virus; Lymphedema; Sickle Cell Disease Respiratory Complaints and Symptoms: No Complaints or Symptoms Medical History: Negative for: Aspiration; Asthma; Chronic Obstructive Pulmonary Disease (COPD); Pneumothorax; Sleep Apnea; [name], [name] ([id number]) Tuberculosis Cardiovascular Complaints and Symptoms: No Complaints or Symptoms Medical History: Positive for: Hypertension Negative for: Angina; Arrhythmia; Congestive Heart Failure; Coronary Artery Disease; Deep Vein  Thrombosis; Hypotension; Myocardial Infarction; Peripheral Arterial Disease; Peripheral Venous Disease; Phlebitis; Vasculitis Past Medical History Notes: pacemaker Gastrointestinal Medical History: Negative for: Cirrhosis ; Colitis; Crohnos; Hepatitis A; Hepatitis B; Hepatitis C Endocrine Medical History: Negative for: Type I Diabetes; Type II Diabetes Genitourinary Medical History: Negative for: End Stage Renal Disease Immunological Medical History: Negative for: Lupus Erythematosus; Raynaudos; Scleroderma Musculoskeletal Medical History: Positive for: Gout; Osteoarthritis Negative for: Rheumatoid Arthritis; Osteomyelitis Neurologic Medical History: Positive for: Neuropathy - lower legs Negative for: Dementia; Quadriplegia; Paraplegia; Seizure Disorder Oncologic Medical History: Negative for: Received Chemotherapy; Received Radiation Psychiatric Complaints and Symptoms: No Complaints or Symptoms Medical History: Negative for: Anorexia/bulimia; Confinement Anxiety [name], [name] ([id number]) Immunizations Pneumococcal Vaccine: Received Pneumococcal Vaccination: Yes Implantable Devices Family and Social History Cancer: Yes - Mother,Siblings; Diabetes: Yes - Father; Heart Disease: Yes - Father,Siblings; Hereditary Spherocytosis: No; Hypertension: Yes - Father,Siblings; Kidney Disease: No; Lung Disease: Yes - Siblings; Seizures: No; Stroke: No; Thyroid Problems: No; Tuberculosis: No; Never smoker; Marital Status - Married; Alcohol Use: Never; Drug Use: No History; Caffeine Use: Never; Financial Concerns: No; Food, Clothing or Shelter Needs: No; Support System Lacking: No; Transportation Concerns: No; Advanced Directives: No; Patient does not want information on Advanced Directives; Medical Power of Attorney: Yes - Karisha Chaudhari (Copy provided) Physician Affirmation I have reviewed and agree with the above information. Electronic Signature(s) Signed: 03/06/2018 12:16:15 AM By:  Lenda Kelp PA-C Signed: 03/07/2018 7:46:43 AM By: Renne Crigler Entered By: Lenda Kelp on 03/06/2018 00:01:55 [name] ([id number]) -------------------------------------------------------------------------------- SuperBill Details Patient Name: [name] Date of Service: 03/05/2018 Medical Record Number: [id number] Patient Account Number: [id number] Date of Birth/Sex: Jul 15, 1930 (84 y.o. F) Treating RN: Renne Crigler Primary Care Provider: Aram Beecham Other Clinician: Referring Provider: Aram Beecham Treating Provider/Extender: Linwood Dibbles, HOYT Weeks in Treatment: 15 Diagnosis Coding ICD-10 Codes Code Description S81.811S Laceration without foreign body, right lower leg, sequela L97.812 Non-pressure chronic ulcer of other part of right lower leg with fat layer exposed Facility Procedures CPT4 Code Description: [id number] 11042 - DEB SUBQ TISSUE 20 SQ CM/< ICD-10 Diagnosis Description L97.812 Non-pressure chronic ulcer of other part of right lower leg wit Modifier: h fat layer expo Quantity: 1 sed Physician Procedures CPT4 Code Description: [id number] 11042 - WC PHYS SUBQ TISS 20 SQ CM ICD-10 Diagnosis Description L97.812 Non-pressure chronic ulcer of other part of right lower leg wit Modifier: h fat layer expo Quantity: 1 sed Electronic  Signature(s) Signed: 03/06/2018 12:16:15 AM By: Lenda Kelp PA-C Entered By: Lenda Kelp on 03/06/2018 00:02:48

## 2018-03-12 ENCOUNTER — Encounter: Payer: Medicare Other | Attending: Physician Assistant | Admitting: Physician Assistant

## 2018-03-12 DIAGNOSIS — I10 Essential (primary) hypertension: Secondary | ICD-10-CM | POA: Insufficient documentation

## 2018-03-12 DIAGNOSIS — L97812 Non-pressure chronic ulcer of other part of right lower leg with fat layer exposed: Secondary | ICD-10-CM | POA: Diagnosis not present

## 2018-03-12 DIAGNOSIS — G629 Polyneuropathy, unspecified: Secondary | ICD-10-CM | POA: Insufficient documentation

## 2018-03-12 DIAGNOSIS — M109 Gout, unspecified: Secondary | ICD-10-CM | POA: Insufficient documentation

## 2018-03-12 DIAGNOSIS — L97822 Non-pressure chronic ulcer of other part of left lower leg with fat layer exposed: Secondary | ICD-10-CM | POA: Insufficient documentation

## 2018-03-13 ENCOUNTER — Ambulatory Visit: Payer: Medicare Other | Attending: Internal Medicine | Admitting: Physical Therapy

## 2018-03-13 DIAGNOSIS — R2681 Unsteadiness on feet: Secondary | ICD-10-CM | POA: Insufficient documentation

## 2018-03-13 NOTE — Progress Notes (Signed)
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 Virus; Lymphedema; Sickle Cell Disease Respiratory Complaints and Symptoms: No Complaints or Symptoms Medical History: Negative for: Aspiration; Asthma; Chronic Obstructive Pulmonary Disease (COPD); Pneumothorax; Sleep Apnea; Tuberculosis Gastrointestinal Medical History: Negative for: Cirrhosis ; Colitis; Crohnos; Hepatitis A; Hepatitis B; Hepatitis C Endocrine Medical History: Negative for: Type I Diabetes; Type II Diabetes Genitourinary Medical History: Negative for: End Stage Renal Disease Immunological Medical History: Negative for: Lupus Erythematosus; Raynaudos; Scleroderma Musculoskeletal Medical History: Positive for: Gout; Osteoarthritis Negative for: Rheumatoid Arthritis; Osteomyelitis Neurologic Medical History: Positive for: Neuropathy - lower legs Negative for: Dementia; Quadriplegia; Paraplegia; Seizure Disorder Oncologic Medical History: Negative for:  Received Chemotherapy; Received Radiation Psychiatric Complaints and Symptoms: No Complaints or Symptoms Medical History: Negative for: Anorexia/bulimia; Confinement Anxiety Immunizations DEASHA, CLENDENIN ([id number]) Pneumococcal Vaccine: Received Pneumococcal Vaccination: Yes Implantable Devices Family and Social History Cancer: Yes - Mother,Siblings; Diabetes: Yes - Father; Heart Disease: Yes - Father,Siblings; Hereditary Spherocytosis: No; Hypertension: Yes - Father,Siblings; Kidney Disease: No; Lung Disease: Yes - Siblings; Seizures: No; Stroke: No; Thyroid Problems: No; Tuberculosis: No; Never smoker; Marital Status - Married; Alcohol Use: Never; Drug Use: No History; Caffeine Use: Never; Financial Concerns: No; Food, Clothing or Shelter Needs: No; Support System Lacking: No; Transportation Concerns: No; Advanced Directives: No; Patient does not want information on Advanced Directives; Medical Power of Attorney: Yes - [name] (Copy provided) Physician Affirmation I have reviewed and agree with the above information. Electronic Signature(s) Signed: 03/12/2018 5:00:26 PM By: Renne Crigler Signed: 03/13/2018 8:40:52 AM By: Lenda Kelp PA-C Entered By: Lenda Kelp on 03/12/2018 15:03:24 [name] ([id number]) -------------------------------------------------------------------------------- SuperBill Details Patient Name: [name] Date of Service: 03/12/2018 Medical Record Number: [id number] Patient Account Number: [id number] Date of Birth/Sex: September 20, 1930 (84 y.o. F) Treating RN: Renne Crigler Primary Care Provider: Aram Beecham Other Clinician: Referring Provider: Aram Beecham Treating Provider/Extender: Linwood Dibbles, HOYT Weeks in Treatment: 16 Diagnosis Coding ICD-10 Codes Code Description S81.811S Laceration without foreign body, right lower leg, sequela L97.812 Non-pressure chronic ulcer of other part of right lower leg with fat layer  exposed Facility Procedures CPT4 Code Description: [id number] 11042 - DEB SUBQ TISSUE 20 SQ CM/< ICD-10 Diagnosis Description L97.812 Non-pressure chronic ulcer of other part of right lower leg wit Modifier: h fat layer expo Quantity: 1 sed Physician Procedures CPT4 Code Description: 0254270 11042 - WC PHYS SUBQ TISS 20 SQ CM ICD-10 Diagnosis Description L97.812 Non-pressure chronic ulcer of other part of right lower leg wit Modifier: h fat layer expo Quantity: 1 sed Electronic Signature(s) Signed: 03/13/2018 8:40:52 AM By: Lenda Kelp PA-C Entered By: Lenda Kelp on 03/12/2018 15:06:26

## 2018-03-14 ENCOUNTER — Other Ambulatory Visit: Payer: Self-pay | Admitting: Neurology

## 2018-03-14 DIAGNOSIS — R269 Unspecified abnormalities of gait and mobility: Secondary | ICD-10-CM

## 2018-03-15 ENCOUNTER — Ambulatory Visit: Payer: Medicare Other | Admitting: Physical Therapy

## 2018-03-15 ENCOUNTER — Encounter: Payer: Self-pay | Admitting: Physical Therapy

## 2018-03-15 DIAGNOSIS — R2681 Unsteadiness on feet: Secondary | ICD-10-CM

## 2018-03-15 NOTE — Therapy (Signed)
[facility] [address] [place], [address] Phone: [phone]   Fax:  [phone]  Physical Therapy Treatment  Patient Details  Name: [name] MRN: [id number] Date of Birth: 10-19-1930 Referring Provider: Aram Beecham, MD   Encounter Date: 03/15/2018  PT End of Session - 03/15/18 1546    Visit Number  [id number]    Number of Visits  37    Date for PT Re-Evaluation  05/10/18    PT Start Time  0327    PT Stop Time  0400    PT Time Calculation (min)  33 min    Equipment Utilized During Treatment  Gait belt    Activity Tolerance  Patient tolerated treatment well;Patient limited by fatigue    Behavior During Therapy  [facility] for tasks assessed/performed       Past Medical History:  Diagnosis Date  . Anemia   . GERD (gastroesophageal reflux disease)   . Hypertension   . Syncope     History reviewed. No pertinent surgical history.  There were no vitals filed for this visit.  Subjective Assessment - 03/15/18 1533    Subjective  Patient arrives late to PT today, and as PT leds her to nustep patient sits in chair in the clinic and reports that she needs to "explain something to PT". Patient offers apologies for missing appointments and running late today. Patient reports she is having a hard time, and saw her PCP Tuesday and they reported she had a "tear of her triceps tendon" and she reports she is going to have to wear an immobilizer, which she is very discouraged about because she is going to have to board her dog, and she is still looking for a sitter for her husband, and that she just has "so much going on". Patient reports she saw her neurologist on Tuesday who scheduled a CT scan for next week and that they told her that she may have fluid in her brain that may be affecting her balance. She reports that she is anxious about possibly having a procedure for this. Patient reports she has had no falls, but that she  has been very upset lately which she feels like has affected her walking. Patient feels as though her balance is worst in the am,. Patient reports at this time she is only having pain in the L arm at this time .    Pertinent History  Pt is well known to this clinic, just recently d/c from this clinic on 12/6 after being seen for R shoulder pain.  Pt reports that her balance is not good and finds herself unsteady at times.  Pt has had EMGs performed BUEs and BLEs which were both negative.  Pt reports she does have numbness in L foot up to ankle at all times.  Pt reports that her balance is worse when standing still, improves when she is moving. Goals: would like to be able to walk through the grass to check on her garden. Pt with h/o ACDF C4-6 on 04/19/17. Pt has a h/o 3 surgeries in her lumbar spine.     Limitations  Standing;Walking    How long can you sit comfortably?  1 hr    How long can you stand comfortably?  10 minutes    How long can you walk comfortably?  2 or 3 blocks    Diagnostic tests  EMG testing BLEs and BUEs which were negative  Patient Stated Goals  To improve her balance, to be able to walk her dog    Pain Onset  More than a month ago       Ther-Ex -OMEGA knee ext 10# 3x10 with cuing for eccentric control  -OMEGA knee flex 15# 3x 10 with cuing for eccentric control -Heel raises at treadmill bar for balance support 3x 10 with cuing for 4sec lowering -HEP review with education on continuing mat and chair exercises as a safe means of increasing LE strength  Prolonged rest needed between seated exercises                        PT Education - 03/15/18 1545    Education provided  Yes    Education Details  Exercise form; discussed with patient that we understand if she needs to miss appt, but to try to call PT first    Person(s) Educated  Patient    Methods  Explanation;Demonstration;Tactile cues;Verbal cues    Comprehension  Verbalized understanding;Returned  demonstration;Verbal cues required;Tactile cues required       PT Short Term Goals - 12/28/17 1546      PT SHORT TERM GOAL #1   Title  Pt will be independent with HEP for carryover between sessions    Time  2    Period  Weeks    Status  New        PT Long Term Goals - 02/27/18 1047      PT LONG TERM GOAL #1   Title  Pt will improve Berg score to at least 45/56 to demonstrate improved balance    Baseline  3/19 38/56    Time  6    Period  Weeks    Status  On-going      PT LONG TERM GOAL #2   Title  Pt will improve 5xSTS time to at least 15 seconds to demonstrate improved BLE strength and balance    Baseline  3/19: 19 sec using UE to stand    Time  6    Period  Weeks    Status  On-going      PT LONG TERM GOAL #3   Title  Pt will demonstrate BLE strength at least 4/5 throughout for improved functional mobility    Baseline  3/19: time did not allow for full assessment, but can assume strength less that 4/5 d/t inability to sit/stand without use of hands    Time  6    Period  Weeks    Status  On-going      PT LONG TERM GOAL #4   Title  Pt will be able to walk through her grass to check on her garden without LOB for improved QOL    Baseline  3/19 Unable    Time  6    Period  Weeks    Status  On-going      PT LONG TERM GOAL #5   Title  Pt will improve to at least 1.0 m/s for pt to transition to community ambulator    Baseline  3/19: 0.24m/s    Time  6    Status  On-going      PT LONG TERM GOAL #6   Title  Pt will improve ABC scale by at least 30% to demonstrate decreased effect of impaired balance on daily activities    Baseline  43.13%    Time  6    Period  Weeks  Status  On-going            Plan - 03/15/18 1548    Clinical Impression Statement  Pt subjective took increased time, as patient revealed that she had a lot weighing on her mind. Patient revealed no suicidal thoughts, or concerning thoughts, just that she is working hard to not be too  overwhelmed. Following subjective, PT asked patient if she felt up to working with PT and patient replied she felt like she could and would like to try some exercises that will not put strain on her arms. PT printed patient new schedule, and informed patient to call clinic with any HEP questions/concerns, and in case she      PT Frequency  2x / week    PT Duration  6 weeks    PT Treatment/Interventions  ADLs/Self Care Home Management;Aquatic Therapy;Biofeedback;Cryotherapy;Electrical Stimulation;Iontophoresis 4mg /ml Dexamethasone;Moist Heat;Ultrasound;Parrafin;Fluidtherapy;Contrast Bath;DME Instruction;Gait training;Stair training;Functional mobility training;Therapeutic activities;Therapeutic exercise;Balance training;Neuromuscular re-education;Patient/family education;Orthotic Fit/Training;Manual techniques;Passive range of motion;Dry needling;Energy conservation;Taping    PT Next Visit Plan  Transition to balance and functional gait focus    PT Home Exercise Plan  Sit<>stand, standing hip ext and abd, bridging, and heel raises    Consulted and Agree with Plan of Care  Patient       Patient will benefit from skilled therapeutic intervention in order to improve the following deficits and impairments:  Abnormal gait, Decreased activity tolerance, Decreased balance, Decreased endurance, Decreased knowledge of use of DME, Decreased mobility, Decreased range of motion, Decreased safety awareness, Decreased strength, Difficulty walking, Impaired perceived functional ability, Impaired flexibility, Impaired sensation, Impaired UE functional use, Improper body mechanics, Postural dysfunction  Visit Diagnosis: Unsteadiness on feet     Problem List Patient Active Problem List   Diagnosis Date Noted  . Lymphedema 10/11/2017  . Chronic venous insufficiency 10/11/2017  . Leg pain 10/11/2017  . Leg swelling 10/11/2017  . COPD (chronic obstructive pulmonary disease) (HCC) 10/11/2017  . Essential  hypertension 10/11/2017   Staci Acosta PT, DPT Staci Acosta 03/15/2018, 4:01 PM  [facility] SP[facility]IC[facility] [address] [place], [address] Phone: [phone]   Fax:  [phone]  Name: [name] MRN: [id number] Date of Birth: 1930/02/12

## 2018-03-15 NOTE — Addendum Note (Signed)
Addended by: Staci Acosta on: 03/15/2018 02:05 PM   Modules accepted: Orders

## 2018-03-16 NOTE — Progress Notes (Signed)
[name], [name] ([id number]) Visit Report for 03/12/2018 Arrival Information Details Patient Name: [name], [name] Date of Service: 03/12/2018 2:00 PM Medical Record Number: [id number] Patient Account Number: [id number] Date of Birth/Sex: 1930/06/30 (84 y.o. F) Treating RN: Phillis Haggis Primary Care Jacqualine Weichel: Aram Beecham Other Clinician: Referring Berlyn Malina: Aram Beecham Treating Damian Hofstra/Extender: Linwood Dibbles, HOYT Weeks in Treatment: 16 Visit Information History Since Last Visit All ordered tests and consults were completed: No Patient Arrived: [name] Added or deleted any medications: No Arrival Time: 14:08 Any new allergies or adverse reactions: No Accompanied By: caregiver Had a fall or experienced change in No Transfer Assistance: EasyPivot Patient activities of daily living that may affect Lift risk of falls: Patient Identification Verified: Yes Signs or symptoms of abuse/neglect since last visito No Secondary Verification Process Yes Hospitalized since last visit: No Completed: Implantable device outside of the clinic excluding No Patient Requires Transmission-Based No cellular tissue based products placed in the center Precautions: since last visit: Patient Has Alerts: No Has Dressing in Place as Prescribed: Yes Pain Present Now: No Electronic Signature(s) Signed: 03/15/2018 4:32:30 PM By: Alejandro Mulling Entered By: Alejandro Mulling on 03/12/2018 14:09:27 [name] ([id number]) -------------------------------------------------------------------------------- Encounter Discharge Information Details Patient Name: [name] Date of Service: 03/12/2018 2:00 PM Medical Record Number: [id number] Patient Account Number: [id number] Date of Birth/Sex: 07-10-1930 (84 y.o. F) Treating RN: Renne Crigler Primary Care Miran Kautzman: Aram Beecham Other Clinician: Referring Conlin Brahm: Aram Beecham Treating Cyleigh Massaro/Extender: Linwood Dibbles, HOYT Weeks in Treatment:  16 Encounter Discharge Information Items Discharge Pain Level: 0 Discharge Condition: Stable Ambulatory Status: Cane Discharge Destination: Home Transportation: Private Auto Accompanied By: self Schedule Follow-up Appointment: Yes Medication Reconciliation completed and No provided to Patient/Care Corneisha Alvi: Patient Clinical Summary of Care: Declined Electronic Signature(s) Signed: 03/12/2018 3:32:11 PM By: Curtis Sites Entered By: Curtis Sites on 03/12/2018 15:32:11 [name] ([id number]) -------------------------------------------------------------------------------- Lower Extremity Assessment Details Patient Name: [name] Date of Service: 03/12/2018 2:00 PM Medical Record Number: [id number] Patient Account Number: [id number] Date of Birth/Sex: 03/29/1930 (84 y.o. F) Treating RN: Phillis Haggis Primary Care Dayra Rapley: Aram Beecham Other Clinician: Referring Syndi Pua: Aram Beecham Treating Ashmi Blas/Extender: Linwood Dibbles, HOYT Weeks in Treatment: 16 Vascular Assessment Pulses: Dorsalis Pedis Palpable: [Right:Yes] Posterior Tibial Extremity colors, hair growth, and conditions: Extremity Color: [Right:Hyperpigmented] Temperature of Extremity: [Right:Warm] Capillary Refill: [Right:< 3 seconds] Toe Nail Assessment Left: Right: Thick: No Discolored: No Deformed: No Improper Length and Hygiene: No Electronic Signature(s) Signed: 03/15/2018 4:32:30 PM By: Alejandro Mulling Entered By: Alejandro Mulling on 03/12/2018 14:15:04 [name] ([id number]) -------------------------------------------------------------------------------- Multi Wound Chart Details Patient Name: [name] Date of Service: 03/12/2018 2:00 PM Medical Record Number: [id number] Patient Account Number: [id number] Date of Birth/Sex: 03-30-1930 (84 y.o. F) Treating RN: Renne Crigler Primary Care Martha Ellerby: Aram Beecham Other Clinician: Referring Gracy Ehly: Aram Beecham Treating Zymiere Trostle/Extender: Linwood Dibbles, HOYT Weeks in Treatment: 16 Vital Signs Height(in): 60 Pulse(bpm): 73 Weight(lbs): 123 Blood Pressure(mmHg): 118/51 Body Mass Index(BMI): 24 Temperature(F): 97.8 Respiratory Rate 18 (breaths/min): Photos: [5:No Photos] [N/A:N/A] Wound Location: [5:Right Lower Leg - Anterior] [N/A:N/A] Wounding Event: [5:Trauma] [N/A:N/A] Primary Etiology: [5:Trauma, Other] [N/A:N/A] Comorbid History: [5:Hypertension, Gout, Osteoarthritis, Neuropathy] [N/A:N/A] Date Acquired: [5:09/05/2017] [N/A:N/A] Weeks of Treatment: [5:16] [N/A:N/A] Wound Status: [5:Open] [N/A:N/A] Measurements L x W x D [5:0.5x0.5x0.1] [N/A:N/A] (cm) Area (cm) : [5:0.196] [N/A:N/A] Volume (cm) : [5:0.02] [N/A:N/A] % Reduction in Area: [5:85.10%] [N/A:N/A] % Reduction in Volume: [5:84.80%] [N/A:N/A] Classification: [5:Full Thickness With Exposed Support Structures] [N/A:N/A] Exudate Amount: [5:Large] [N/A:N/A] Exudate  Type: [5:Serosanguineous] [N/A:N/A] Exudate Color: [5:red, brown] [N/A:N/A] Wound Margin: [5:Distinct, outline attached] [N/A:N/A] Granulation Amount: [5:None Present (0%)] [N/A:N/A] Necrotic Amount: [5:Large (67-100%)] [N/A:N/A] Exposed Structures: [5:Fat Layer (Subcutaneous Tissue) Exposed: Yes Fascia: No Tendon: No Muscle: No Joint: No Bone: No] [N/A:N/A] Epithelialization: [5:Small (1-33%)] [N/A:N/A] Periwound Skin Texture: [5:Induration: Yes Scarring: Yes Excoriation: No Callus: No Crepitus: No Rash: No] [N/A:N/A] Periwound Skin Moisture: Maceration: No N/A N/A Dry/Scaly: No Periwound Skin Color: Atrophie Blanche: No N/A N/A Cyanosis: No Ecchymosis: No Erythema: No Hemosiderin Staining: No Mottled: No Pallor: No Rubor: No Temperature: No Abnormality N/A N/A Tenderness on Palpation: Yes N/A N/A Wound Preparation: Ulcer Cleansing: N/A N/A Rinsed/Irrigated with Saline Topical Anesthetic Applied: Other: lidocaine 4% Treatment  Notes Electronic Signature(s) Signed: 03/12/2018 5:00:26 PM By: Renne Crigler Entered By: Renne Crigler on 03/12/2018 14:54:37 [name] ([id number]) -------------------------------------------------------------------------------- Multi-Disciplinary Care Plan Details Patient Name: [name] Date of Service: 03/12/2018 2:00 PM Medical Record Number: [id number] Patient Account Number: [id number] Date of Birth/Sex: [birth year] (84 y.o. F) Treating RN: Renne Crigler Primary Care Kash Mothershead: Aram Beecham Other Clinician: Referring Anglia Blakley: Aram Beecham Treating Olden Klauer/Extender: Linwood Dibbles, HOYT Weeks in Treatment: 16 Active Inactive ` Orientation to the Wound Care Program Nursing Diagnoses: Knowledge deficit related to the wound healing center program Goals: Patient/caregiver will verbalize understanding of the Wound Healing Center Program Date Initiated: 11/14/2017 Target Resolution Date: 11/28/2017 Goal Status: Active Interventions: Provide education on orientation to the wound center Notes: ` Wound/Skin Impairment Nursing Diagnoses: Impaired tissue integrity Knowledge deficit related to ulceration/compromised skin integrity Goals: Patient/caregiver will verbalize understanding of skin care regimen Date Initiated: 11/14/2017 Target Resolution Date: 11/28/2017 Goal Status: Active Ulcer/skin breakdown will have a volume reduction of 30% by week 4 Date Initiated: 11/14/2017 Target Resolution Date: 11/28/2017 Goal Status: Active Interventions: Assess patient/caregiver ability to obtain necessary supplies Assess patient/caregiver ability to perform ulcer/skin care regimen upon admission and as needed Assess ulceration(s) every visit Treatment Activities: Skin care regimen initiated : 11/14/2017 Notes: Electronic Signature(s) Signed: 03/12/2018 5:00:26 PM By: Lenore Cordia, Lawernce Keas ([id number]) Entered By: Renne Crigler on 03/12/2018  14:54:25 [name] ([id number]) -------------------------------------------------------------------------------- Pain Assessment Details Patient Name: [name] Date of Service: 03/12/2018 2:00 PM Medical Record Number: [id number] Patient Account Number: [id number] Date of Birth/Sex: Dec 11, 1930 (84 y.o. F) Treating RN: Phillis Haggis Primary Care Bayron Dalto: Aram Beecham Other Clinician: Referring Jupiter Boys: Aram Beecham Treating Kealohilani Maiorino/Extender: Linwood Dibbles, HOYT Weeks in Treatment: 16 Active Problems Location of Pain Severity and Description of Pain Patient Has Paino No Site Locations Pain Management and Medication Current Pain Management: Electronic Signature(s) Signed: 03/15/2018 4:32:30 PM By: Alejandro Mulling Entered By: Alejandro Mulling on 03/12/2018 14:09:35 [name] ([id number]) -------------------------------------------------------------------------------- Patient/Caregiver Education Details Patient Name: [name] Date of Service: 03/12/2018 2:00 PM Medical Record Number: [id number] Patient Account Number: [id number] Date of Birth/Gender: 08-Aug-1930 (84 y.o. F) Treating RN: Curtis Sites Primary Care Physician: Aram Beecham Other Clinician: Referring Physician: Aram Beecham Treating Physician/Extender: Skeet Simmer in Treatment: 16 Education Assessment Education Provided To: Patient Education Topics Provided Wound/Skin Impairment: Handouts: Other: wound care as ordered Methods: Demonstration, Explain/Verbal Responses: State content correctly Electronic Signature(s) Signed: 03/12/2018 5:08:18 PM By: Curtis Sites Entered By: Curtis Sites on 03/12/2018 15:33:16 [name] ([id number]) -------------------------------------------------------------------------------- Wound Assessment Details Patient Name: [name] Date of Service: 03/12/2018 2:00 PM Medical Record Number: [id number] Patient Account Number:  [id number] Date of Birth/Sex: [birth year] (84 y.o. F) Treating RN: Phillis Haggis Primary Care Jasimine Simms: Judithann Sheen,  Tinnie Gens Other Clinician: Referring Dajane Valli: Aram Beecham Treating Raeden Belzer/Extender: Linwood Dibbles, HOYT Weeks in Treatment: 16 Wound Status Wound Number: 5 Primary Etiology: Trauma, Other Wound Location: Right Lower Leg - Anterior Wound Status: Open Wounding Event: Trauma Comorbid Hypertension, Gout, Osteoarthritis, History: Neuropathy Date Acquired: 09/05/2017 Weeks Of Treatment: 16 Clustered Wound: No Photos Photo Uploaded By: Renne Crigler on 03/12/2018 16:46:56 Wound Measurements Length: (cm) 0.5 Width: (cm) 0.5 Depth: (cm) 0.1 Area: (cm) 0.196 Volume: (cm) 0.02 % Reduction in Area: 85.1% % Reduction in Volume: 84.8% Epithelialization: Small (1-33%) Tunneling: No Undermining: No Wound Description Full Thickness With Exposed Support Foul Classification: Structures Slou Wound Margin: Distinct, outline attached Exudate Large Amount: Exudate Type: Serosanguineous Exudate Color: red, brown Odor After Cleansing: No gh/Fibrino Yes Wound Bed Granulation Amount: None Present (0%) Exposed Structure Necrotic Amount: Large (67-100%) Fascia Exposed: No Necrotic Quality: Adherent Slough Fat Layer (Subcutaneous Tissue) Exposed: Yes Tendon Exposed: No Muscle Exposed: No Joint Exposed: No Bone Exposed: No [name], [name] ([id number]) Periwound Skin Texture Texture Color No Abnormalities Noted: No No Abnormalities Noted: No Callus: No Atrophie Blanche: No Crepitus: No Cyanosis: No Excoriation: No Ecchymosis: No Induration: Yes Erythema: No Rash: No Hemosiderin Staining: No Scarring: Yes Mottled: No Pallor: No Moisture Rubor: No No Abnormalities Noted: No Dry / Scaly: No Temperature / Pain Maceration: No Temperature: No Abnormality Tenderness on Palpation: Yes Wound Preparation Ulcer Cleansing: Rinsed/Irrigated with Saline Topical  Anesthetic Applied: Other: lidocaine 4%, Treatment Notes Wound #5 (Right, Anterior Lower Leg) 1. Cleansed with: Clean wound with Normal Saline 2. Anesthetic Topical Lidocaine 4% cream to wound bed prior to debridement 4. Dressing Applied: Prisma Ag 5. Secondary Dressing Applied Bordered Foam Dressing Electronic Signature(s) Signed: 03/15/2018 4:32:30 PM By: Alejandro Mulling Entered By: Alejandro Mulling on 03/12/2018 14:12:48 [name] ([id number]) -------------------------------------------------------------------------------- Vitals Details Patient Name: [name] Date of Service: 03/12/2018 2:00 PM Medical Record Number: [id number] Patient Account Number: [id number] Date of Birth/Sex: 14-Jul-1930 (84 y.o. F) Treating RN: Phillis Haggis Primary Care Quinley Nesler: Aram Beecham Other Clinician: Referring Odie Rauen: Aram Beecham Treating Haeley Fordham/Extender: Linwood Dibbles, HOYT Weeks in Treatment: 16 Vital Signs Time Taken: 14:13 Temperature (F): 97.8 Height (in): 60 Pulse (bpm): 73 Weight (lbs): 123 Respiratory Rate (breaths/min): 18 Body Mass Index (BMI): 24 Blood Pressure (mmHg): 118/51 Reference Range: 80 - 120 mg / dl Electronic Signature(s) Signed: 03/15/2018 4:32:30 PM By: Alejandro Mulling Entered By: Alejandro Mulling on 03/12/2018 14:14:40

## 2018-03-19 ENCOUNTER — Encounter: Payer: Medicare Other | Admitting: Physician Assistant

## 2018-03-19 DIAGNOSIS — L97812 Non-pressure chronic ulcer of other part of right lower leg with fat layer exposed: Secondary | ICD-10-CM | POA: Diagnosis not present

## 2018-03-20 ENCOUNTER — Ambulatory Visit: Payer: Medicare Other | Admitting: Physical Therapy

## 2018-03-21 NOTE — Progress Notes (Signed)
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 Complaints or Symptoms Medical History: Negative for: Anorexia/bulimia; Confinement Anxiety [name], [name] ([id number]) Immunizations Pneumococcal Vaccine: Received Pneumococcal Vaccination: Yes Implantable Devices Family and Social History Cancer: Yes - Mother,Siblings; Diabetes: Yes - Father; Heart Disease: Yes - Father,Siblings; Hereditary Spherocytosis: No; Hypertension: Yes - Father,Siblings; Kidney Disease: No; Lung Disease: Yes - Siblings; Seizures: No; Stroke: No; Thyroid Problems: No; Tuberculosis: No; Never smoker; Marital Status - Married; Alcohol Use: Never; Drug Use: No History; Caffeine Use: Never; Financial Concerns: No; Food, Clothing or Shelter Needs: No; Support System Lacking: No; Transportation Concerns: No; Advanced Directives: No; Patient does not want information on Advanced Directives; Medical Power of Attorney: Yes - Laniesha Das (Copy provided) Physician Affirmation I have reviewed and agree with the above information. Electronic Signature(s) Signed: 03/20/2018 8:17:58 AM By:  Lenda Kelp PA-C Signed: 03/20/2018 4:09:12 PM By: Renne Crigler Entered By: Lenda Kelp on 03/20/2018 08:00:12 [name] ([id number]) -------------------------------------------------------------------------------- SuperBill Details Patient Name: [name] Date of Service: 03/19/2018 Medical Record Number: [id number] Patient Account Number: [id number] Date of Birth/Sex: March 20, 1930 (84 y.o. F) Treating RN: Renne Crigler Primary Care Provider: Aram Beecham Other Clinician: Referring Provider: Aram Beecham Treating Provider/Extender: Linwood Dibbles, Aylssa Herrig Weeks in Treatment: 17 Diagnosis Coding ICD-10 Codes Code Description S81.811S Laceration without foreign body, right lower leg, sequela L97.812 Non-pressure chronic ulcer of other part of right lower leg with fat layer exposed Facility Procedures CPT4 Code: [id number] Description: 74259 - WOUND CARE VISIT-LEV 2 EST PT Modifier: Quantity: 1 Physician Procedures CPT4 Code Description: 5638756 99213 - WC PHYS LEVEL 3 - EST PT ICD-10 Diagnosis Description S81.811S Laceration without foreign body, right lower leg, sequela L97.812 Non-pressure chronic ulcer of other part of right lower leg wi Modifier: th fat layer expo Quantity: 1 sed Electronic Signature(s) Signed: 03/20/2018 8:17:58 AM By: Lenda Kelp PA-C Entered By: Lenda Kelp on 03/20/2018 08:01:29

## 2018-03-21 NOTE — Progress Notes (Signed)
Natasha, Barnett ([id number]) Visit Report for 03/19/2018 Arrival Information Details Patient Name: Natasha, Barnett Date of Service: 03/19/2018 1:00 PM Medical Record Number: [id number] Patient Account Number: [id number] Date of Birth/Sex: February 17, 1930 (84 y.o. F) Treating RN: Phillis Haggis Primary Care Barrett Goldie: Aram Beecham Other Clinician: Referring Nakeia Calvi: Aram Beecham Treating Marcelia Petersen/Extender: Linwood Dibbles, HOYT Weeks in Treatment: 17 Visit Information History Since Last Visit All ordered tests and consults were completed: No Patient Arrived: Cane Added or deleted any medications: No Arrival Time: 13:05 Any new allergies or adverse reactions: No Accompanied By: self Had a fall or experienced change in No Transfer Assistance: EasyPivot Patient activities of daily living that may affect Lift risk of falls: Patient Identification Verified: Yes Signs or symptoms of abuse/neglect since last visito No Secondary Verification Process Yes Hospitalized since last visit: No Completed: Implantable device outside of the clinic excluding No Patient Requires Transmission-Based No cellular tissue based products placed in the center Precautions: since last visit: Patient Has Alerts: No Has Dressing in Place as Prescribed: Yes Pain Present Now: No Electronic Signature(s) Signed: 03/19/2018 4:32:12 PM By: Alejandro Mulling Entered By: Alejandro Mulling on 03/19/2018 13:07:18 [name] ([id number]) -------------------------------------------------------------------------------- Clinic Level of Care Assessment Details Patient Name: [name] Date of Service: 03/19/2018 1:00 PM Medical Record Number: [id number] Patient Account Number: [id number] Date of Birth/Sex: 15-Nov-1930 (84 y.o. F) Treating RN: Renne Crigler Primary Care Elazar Argabright: Aram Beecham Other Clinician: Referring Javoris Star: Aram Beecham Treating Cledith Abdou/Extender: Linwood Dibbles, HOYT Weeks in Treatment:  17 Clinic Level of Care Assessment Items TOOL 4 Quantity Score []  - Use when only an EandM is performed on FOLLOW-UP visit 0 ASSESSMENTS - Nursing Assessment / Reassessment X - Reassessment of Co-morbidities (includes updates in patient status) 1 10 X- 1 5 Reassessment of Adherence to Treatment Plan ASSESSMENTS - Wound and Skin Assessment / Reassessment X - Simple Wound Assessment / Reassessment - one wound 1 5 []  - 0 Complex Wound Assessment / Reassessment - multiple wounds []  - 0 Dermatologic / Skin Assessment (not related to wound area) ASSESSMENTS - Focused Assessment []  - Circumferential Edema Measurements - multi extremities 0 []  - 0 Nutritional Assessment / Counseling / Intervention []  - 0 Lower Extremity Assessment (monofilament, tuning fork, pulses) []  - 0 Peripheral Arterial Disease Assessment (using hand held doppler) ASSESSMENTS - Ostomy and/or Continence Assessment and Care []  - Incontinence Assessment and Management 0 []  - 0 Ostomy Care Assessment and Management (repouching, etc.) PROCESS - Coordination of Care X - Simple Patient / Family Education for ongoing care 1 15 []  - 0 Complex (extensive) Patient / Family Education for ongoing care []  - 0 Staff obtains , Records, Test Results / Process Orders []  - 0 Staff telephones HHA, Nursing Homes / Clarify orders / etc []  - 0 Routine Transfer to another Facility (non-emergent condition) []  - 0 Routine Hospital Admission (non-emergent condition) []  - 0 New Admissions / / Ordering NPWT, Apligraf, etc. []  - 0 Emergency Hospital Admission (emergent condition) X- 1 10 Simple Discharge Coordination Natasha, Barnett ( ) []  - 0 Complex (extensive) Discharge Coordination PROCESS - Special Needs []  - Pediatric / Minor Patient Management 0 []  - 0 Isolation Patient Management []  - 0 Hearing / Language / Visual special needs []  - 0 Assessment of Community assistance  (transportation, D/C planning, etc.) []  - 0 Additional assistance / Altered mentation []  - 0 Support Surface(s) Assessment (bed, cushion, seat, etc.) INTERVENTIONS - Wound Cleansing / Measurement X - Simple Wound Cleansing -  one wound 1 5 []  - 0 Complex Wound Cleansing - multiple wounds X- 1 5 Wound Imaging (photographs - any number of wounds) []  - 0 Wound Tracing (instead of photographs) X- 1 5 Simple Wound Measurement - one wound []  - 0 Complex Wound Measurement - multiple wounds INTERVENTIONS - Wound Dressings X - Small Wound Dressing one or multiple wounds 1 10 []  - 0 Medium Wound Dressing one or multiple wounds []  - 0 Large Wound Dressing one or multiple wounds []  - 0 Application of Medications - topical []  - 0 Application of Medications - injection INTERVENTIONS - Miscellaneous []  - External ear exam 0 []  - 0 Specimen Collection (cultures, biopsies, blood, body fluids, etc.) []  - 0 Specimen(s) / Culture(s) sent or taken to Lab for analysis []  - 0 Patient Transfer (multiple staff / Nurse, adult / Similar devices) []  - 0 Simple Staple / Suture removal (25 or less) []  - 0 Complex Staple / Suture removal (26 or more) []  - 0 Hypo / Hyperglycemic Management (close monitor of Blood Glucose) []  - 0 Ankle / Brachial Index (ABI) - do not check if billed separately X- 1 5 Vital Signs Natasha, Barnett ([id number]) Has the patient been seen at the hospital within the last three years: Yes Total Score: 75 Level Of Care: New/Established - Level 2 Electronic Signature(s) Signed: 03/19/2018 2:19:56 PM By: Renne Crigler Entered By: Renne Crigler on 03/19/2018 13:47:17 [name] ([id number]) -------------------------------------------------------------------------------- Encounter Discharge Information Details Patient Name: [name] Date of Service: 03/19/2018 1:00 PM Medical Record Number: [id number] Patient Account Number: [id number] Date of Birth/Sex:  [birth year] (84 y.o. F) Treating RN: Renne Crigler Primary Care Delvon Chipps: Aram Beecham Other Clinician: Referring Shaquan Puerta: Aram Beecham Treating Atilla Zollner/Extender: Linwood Dibbles, HOYT Weeks in Treatment: 5 Encounter Discharge Information Items Discharge Pain Level: 0 Discharge Condition: Stable Ambulatory Status: Cane Discharge Destination: Home Transportation: Private Auto Accompanied By: caregiver Schedule Follow-up Appointment: Yes Medication Reconciliation completed and No provided to Patient/Care Latiqua Daloia: Patient Clinical Summary of Care: Declined Electronic Signature(s) Signed: 03/19/2018 4:32:12 PM By: Alejandro Mulling Entered By: Alejandro Mulling on 03/19/2018 13:53:51 [name] ([id number]) -------------------------------------------------------------------------------- Lower Extremity Assessment Details Patient Name: [name] Date of Service: 03/19/2018 1:00 PM Medical Record Number: [id number] Patient Account Number: [id number] Date of Birth/Sex: Sep 17, 1930 (84 y.o. F) Treating RN: Phillis Haggis Primary Care Ryliee Figge: Aram Beecham Other Clinician: Referring Naliyah Neth: Aram Beecham Treating Scottlyn Mchaney/Extender: Linwood Dibbles, HOYT Weeks in Treatment: 17 Vascular Assessment Pulses: Dorsalis Pedis Palpable: [Right:Yes] Posterior Tibial Extremity colors, hair growth, and conditions: Extremity Color: [Right:Hyperpigmented] Temperature of Extremity: [Right:Warm] Capillary Refill: [Right:< 3 seconds] Toe Nail Assessment Left: Right: Thick: No Discolored: No Deformed: No Improper Length and Hygiene: No Electronic Signature(s) Signed: 03/19/2018 4:32:12 PM By: Alejandro Mulling Entered By: Alejandro Mulling on 03/19/2018 13:15:20 [name] ([id number]) -------------------------------------------------------------------------------- Multi Wound Chart Details Patient Name: [name] Date of Service: 03/19/2018 1:00 PM Medical Record  Number: [id number] Patient Account Number: [id number] Date of Birth/Sex: 1930/04/16 (84 y.o. F) Treating RN: Renne Crigler Primary Care Janthony Holleman: Aram Beecham Other Clinician: Referring Wilhemina Grall: Aram Beecham Treating Auden Wettstein/Extender: Linwood Dibbles, HOYT Weeks in Treatment: 17 Vital Signs Height(in): 60 Pulse(bpm): 63 Weight(lbs): 123 Blood Pressure(mmHg): 127/65 Body Mass Index(BMI): 24 Temperature(F): 98.1 Respiratory Rate 18 (breaths/min): Photos: [5:No Photos] [N/A:N/A] Wound Location: [5:Right Lower Leg - Anterior] [N/A:N/A] Wounding Event: [5:Trauma] [N/A:N/A] Primary Etiology: [5:Trauma, Other] [N/A:N/A] Comorbid History: [5:Hypertension, Gout, Osteoarthritis, Neuropathy] [N/A:N/A] Date Acquired: [5:09/05/2017] [N/A:N/A] Weeks of Treatment: [5:17] [N/A:N/A] Wound  Status: [5:Open] [N/A:N/A] Measurements L x W x D [5:0.3x0.5x0.1] [N/A:N/A] (cm) Area (cm) : [5:0.118] [N/A:N/A] Volume (cm) : [5:0.012] [N/A:N/A] % Reduction in Area: [5:91.10%] [N/A:N/A] % Reduction in Volume: [5:90.90%] [N/A:N/A] Classification: [5:Full Thickness With Exposed Support Structures] [N/A:N/A] Exudate Amount: [5:Large] [N/A:N/A] Exudate Type: [5:Serosanguineous] [N/A:N/A] Exudate Color: [5:red, brown] [N/A:N/A] Wound Margin: [5:Distinct, outline attached] [N/A:N/A] Granulation Amount: [5:Medium (34-66%)] [N/A:N/A] Granulation Quality: [5:Red] [N/A:N/A] Necrotic Amount: [5:Medium (34-66%)] [N/A:N/A] Exposed Structures: [5:Fat Layer (Subcutaneous Tissue) Exposed: Yes Fascia: No Tendon: No Muscle: No Joint: No Bone: No] [N/A:N/A] Epithelialization: [5:Small (1-33%)] [N/A:N/A] Periwound Skin Texture: [5:Induration: Yes Scarring: Yes Excoriation: No Callus: No] [N/A:N/A] Crepitus: No Rash: No Periwound Skin Moisture: Maceration: No N/A N/A Dry/Scaly: No Periwound Skin Color: Atrophie Blanche: No N/A N/A Cyanosis: No Ecchymosis: No Erythema: No Hemosiderin Staining: No Mottled:  No Pallor: No Rubor: No Temperature: No Abnormality N/A N/A Tenderness on Palpation: Yes N/A N/A Wound Preparation: Ulcer Cleansing: N/A N/A Rinsed/Irrigated with Saline Topical Anesthetic Applied: Other: lidocaine 4% Treatment Notes Electronic Signature(s) Signed: 03/19/2018 2:19:56 PM By: Renne Crigler Entered By: Renne Crigler on 03/19/2018 13:44:30 [name] ([id number]) -------------------------------------------------------------------------------- Multi-Disciplinary Care Plan Details Patient Name: [name] Date of Service: 03/19/2018 1:00 PM Medical Record Number: [id number] Patient Account Number: [id number] Date of Birth/Sex: 05/09/1930 (84 y.o. F) Treating RN: Renne Crigler Primary Care Emma-Lee Oddo: Aram Beecham Other Clinician: Referring Tatanisha Cuthbert: Aram Beecham Treating Yatzari Jonsson/Extender: Linwood Dibbles, HOYT Weeks in Treatment: 17 Active Inactive ` Orientation to the Wound Care Program Nursing Diagnoses: Knowledge deficit related to the wound healing center program Goals: Patient/caregiver will verbalize understanding of the Wound Healing Center Program Date Initiated: 11/14/2017 Target Resolution Date: 11/28/2017 Goal Status: Active Interventions: Provide education on orientation to the wound center Notes: ` Wound/Skin Impairment Nursing Diagnoses: Impaired tissue integrity Knowledge deficit related to ulceration/compromised skin integrity Goals: Patient/caregiver will verbalize understanding of skin care regimen Date Initiated: 11/14/2017 Target Resolution Date: 11/28/2017 Goal Status: Active Ulcer/skin breakdown will have a volume reduction of 30% by week 4 Date Initiated: 11/14/2017 Target Resolution Date: 11/28/2017 Goal Status: Active Interventions: Assess patient/caregiver ability to obtain necessary supplies Assess patient/caregiver ability to perform ulcer/skin care regimen upon admission and as needed Assess ulceration(s) every  visit Treatment Activities: Skin care regimen initiated : 11/14/2017 Notes: Electronic Signature(s) Signed: 03/19/2018 2:19:56 PM By: Lenore Cordia, Lawernce Keas ([id number]) Entered By: Renne Crigler on 03/19/2018 13:44:22 [name] ([id number]) -------------------------------------------------------------------------------- Pain Assessment Details Patient Name: [name] Date of Service: 03/19/2018 1:00 PM Medical Record Number: [id number] Patient Account Number: [id number] Date of Birth/Sex: [birth year] (84 y.o. F) Treating RN: Phillis Haggis Primary Care Sahithi Ordoyne: Aram Beecham Other Clinician: Referring Britini Garcilazo: Aram Beecham Treating Bryssa Tones/Extender: Linwood Dibbles, HOYT Weeks in Treatment: 17 Active Problems Location of Pain Severity and Description of Pain Patient Has Paino No Site Locations Pain Management and Medication Current Pain Management: Electronic Signature(s) Signed: 03/19/2018 4:32:12 PM By: Alejandro Mulling Entered By: Alejandro Mulling on 03/19/2018 13:09:07 [name] ([id number]) -------------------------------------------------------------------------------- Patient/Caregiver Education Details Patient Name: [name] Date of Service: 03/19/2018 1:00 PM Medical Record Number: [id number] Patient Account Number: [id number] Date of Birth/Gender: 07-28-1930 (84 y.o. F) Treating RN: Phillis Haggis Primary Care Physician: Aram Beecham Other Clinician: Referring Physician: Aram Beecham Treating Physician/Extender: Skeet Simmer in Treatment: 17 Education Assessment Education Provided To: Patient Education Topics Provided Wound/Skin Impairment: Handouts: Caring for Your Ulcer, Other: change dressing as ordered Methods: Demonstration, Explain/Verbal Responses: State content correctly Electronic Signature(s) Signed: 03/19/2018 4:32:12  PM By: Alejandro Mulling Entered By: Alejandro Mulling on 03/19/2018 13:54:08 [name] ([id number]) -------------------------------------------------------------------------------- Wound Assessment Details Patient Name: [name] Date of Service: 03/19/2018 1:00 PM Medical Record Number: [id number] Patient Account Number: [id number] Date of Birth/Sex: 14-Apr-1930 (84 y.o. F) Treating RN: Phillis Haggis Primary Care Darolyn Double: Aram Beecham Other Clinician: Referring Blair Mesina: Aram Beecham Treating Briton Sellman/Extender: Linwood Dibbles, HOYT Weeks in Treatment: 17 Wound Status Wound Number: 5 Primary Etiology: Trauma, Other Wound Location: Right Lower Leg - Anterior Wound Status: Open Wounding Event: Trauma Comorbid Hypertension, Gout, Osteoarthritis, History: Neuropathy Date Acquired: 09/05/2017 Weeks Of Treatment: 17 Clustered Wound: No Photos Photo Uploaded By: Elliot Gurney, BSN, RN, CWS, Kim on 03/20/2018 08:47:50 Wound Measurements Length: (cm) 0.3 Width: (cm) 0.5 Depth: (cm) 0.1 Area: (cm) 0.118 Volume: (cm) 0.012 % Reduction in Area: 91.1% % Reduction in Volume: 90.9% Epithelialization: Small (1-33%) Tunneling: No Undermining: No Wound Description Full Thickness With Exposed Support Foul Odor Classification: Structures Slough/Fi Wound Margin: Distinct, outline attached Exudate Large Amount: Exudate Type: Serosanguineous Exudate Color: red, brown After Cleansing: No brino Yes Wound Bed Granulation Amount: Medium (34-66%) Exposed Structure Granulation Quality: Red Fascia Exposed: No Necrotic Amount: Medium (34-66%) Fat Layer (Subcutaneous Tissue) Exposed: Yes Necrotic Quality: Adherent Slough Tendon Exposed: No Muscle Exposed: No Joint Exposed: No Bone Exposed: No Periwound Skin Texture Texture Color [name], [name] ([id number]) No Abnormalities Noted: No No Abnormalities Noted: No Callus: No Atrophie Blanche: No Crepitus: No Cyanosis: No Excoriation: No Ecchymosis: No Induration: Yes Erythema: No Rash: No Hemosiderin  Staining: No Scarring: Yes Mottled: No Pallor: No Moisture Rubor: No No Abnormalities Noted: No Dry / Scaly: No Temperature / Pain Maceration: No Temperature: No Abnormality Tenderness on Palpation: Yes Wound Preparation Ulcer Cleansing: Rinsed/Irrigated with Saline Topical Anesthetic Applied: Other: lidocaine 4%, Treatment Notes Wound #5 (Right, Anterior Lower Leg) 1. Cleansed with: Clean wound with Normal Saline 2. Anesthetic Topical Lidocaine 4% cream to wound bed prior to debridement 4. Dressing Applied: Other dressing (specify in notes) 5. Secondary Dressing Applied Bordered Foam Dressing Notes endoform Electronic Signature(s) Signed: 03/19/2018 4:32:12 PM By: Alejandro Mulling Entered By: Alejandro Mulling on 03/19/2018 13:14:28 [name] ([id number]) -------------------------------------------------------------------------------- Vitals Details Patient Name: [name] Date of Service: 03/19/2018 1:00 PM Medical Record Number: [id number] Patient Account Number: [id number] Date of Birth/Sex: 12/17/1929 (84 y.o. F) Treating RN: Phillis Haggis Primary Care Noheli Melder: Aram Beecham Other Clinician: Referring Koula Venier: Aram Beecham Treating Danira Nylander/Extender: Linwood Dibbles, HOYT Weeks in Treatment: 17 Vital Signs Time Taken: 13:09 Temperature (F): 98.1 Height (in): 60 Pulse (bpm): 63 Weight (lbs): 123 Respiratory Rate (breaths/min): 18 Body Mass Index (BMI): 24 Blood Pressure (mmHg): 127/65 Reference Range: 80 - 120 mg / dl Electronic Signature(s) Signed: 03/19/2018 4:32:12 PM By: Alejandro Mulling Entered By: Alejandro Mulling on 03/19/2018 13:09:42

## 2018-03-22 ENCOUNTER — Ambulatory Visit: Payer: Medicare Other | Admitting: Physical Therapy

## 2018-03-26 ENCOUNTER — Encounter: Payer: Medicare Other | Admitting: Physician Assistant

## 2018-03-26 DIAGNOSIS — L97812 Non-pressure chronic ulcer of other part of right lower leg with fat layer exposed: Secondary | ICD-10-CM | POA: Diagnosis not present

## 2018-03-27 ENCOUNTER — Encounter
Admission: RE | Disposition: A | Discharge status: Home / Self Care | Payer: Self-pay | Source: Ambulatory Visit | Attending: Surgery

## 2018-03-27 ENCOUNTER — Ambulatory Visit: Payer: Medicare Other | Admitting: Physician Assistant

## 2018-03-27 ENCOUNTER — Observation Stay
Admission: RE | Admit: 2018-03-27 | Discharge: 2018-03-28 | Disposition: A | Discharge status: Home / Self Care | Payer: Medicare Other | Source: Ambulatory Visit | Attending: Surgery | Admitting: Surgery

## 2018-03-27 ENCOUNTER — Encounter: Payer: Medicare Other | Admitting: Physical Therapy

## 2018-03-27 ENCOUNTER — Ambulatory Visit: Payer: Medicare Other | Admitting: Anesthesiology

## 2018-03-27 ENCOUNTER — Other Ambulatory Visit: Payer: Self-pay

## 2018-03-27 ENCOUNTER — Encounter: Payer: Self-pay | Admitting: *Deleted

## 2018-03-27 DIAGNOSIS — Z9181 History of falling: Secondary | ICD-10-CM | POA: Diagnosis not present

## 2018-03-27 DIAGNOSIS — Z96652 Presence of left artificial knee joint: Secondary | ICD-10-CM | POA: Diagnosis not present

## 2018-03-27 DIAGNOSIS — I10 Essential (primary) hypertension: Secondary | ICD-10-CM | POA: Insufficient documentation

## 2018-03-27 DIAGNOSIS — K219 Gastro-esophageal reflux disease without esophagitis: Secondary | ICD-10-CM | POA: Diagnosis not present

## 2018-03-27 DIAGNOSIS — Z79899 Other long term (current) drug therapy: Secondary | ICD-10-CM | POA: Diagnosis not present

## 2018-03-27 DIAGNOSIS — G629 Polyneuropathy, unspecified: Secondary | ICD-10-CM | POA: Insufficient documentation

## 2018-03-27 DIAGNOSIS — I739 Peripheral vascular disease, unspecified: Secondary | ICD-10-CM | POA: Diagnosis not present

## 2018-03-27 DIAGNOSIS — Z950 Presence of cardiac pacemaker: Secondary | ICD-10-CM | POA: Diagnosis not present

## 2018-03-27 DIAGNOSIS — J449 Chronic obstructive pulmonary disease, unspecified: Secondary | ICD-10-CM | POA: Diagnosis not present

## 2018-03-27 DIAGNOSIS — M7022 Olecranon bursitis, left elbow: Secondary | ICD-10-CM | POA: Diagnosis not present

## 2018-03-27 DIAGNOSIS — Z79890 Hormone replacement therapy (postmenopausal): Secondary | ICD-10-CM | POA: Insufficient documentation

## 2018-03-27 DIAGNOSIS — M71122 Other infective bursitis, left elbow: Secondary | ICD-10-CM | POA: Diagnosis present

## 2018-03-27 HISTORY — PX: OLECRANON BURSECTOMY: SHX2097

## 2018-03-27 HISTORY — DX: Nausea with vomiting, unspecified: R11.2

## 2018-03-27 HISTORY — DX: Peripheral vascular disease, unspecified: I73.9

## 2018-03-27 HISTORY — DX: Other specified postprocedural states: Z98.890

## 2018-03-27 HISTORY — PX: I & D EXTREMITY: SHX5045

## 2018-03-27 SURGERY — BURSECTOMY, ELBOW
Anesthesia: General | Laterality: Left

## 2018-03-27 MED ORDER — FENTANYL CITRATE (PF) 100 MCG/2ML IJ SOLN
INTRAMUSCULAR | Status: AC
  Filled 2018-03-27: qty 2

## 2018-03-27 MED ORDER — PANTOPRAZOLE SODIUM 40 MG PO TBEC
40.0000 mg | DELAYED_RELEASE_TABLET | Freq: Every day | ORAL | Status: DC
  Administered 2018-03-28: 40 mg via ORAL
  Filled 2018-03-27: qty 1

## 2018-03-27 MED ORDER — FENTANYL CITRATE (PF) 100 MCG/2ML IJ SOLN
INTRAMUSCULAR | Status: AC
  Administered 2018-03-27: 25 ug via INTRAVENOUS
  Filled 2018-03-27: qty 2

## 2018-03-27 MED ORDER — PROPRANOLOL HCL ER 60 MG PO CP24
60.0000 mg | ORAL_CAPSULE | Freq: Every day | ORAL | Status: DC
  Administered 2018-03-28: 60 mg via ORAL
  Filled 2018-03-27 (×2): qty 1

## 2018-03-27 MED ORDER — SODIUM CHLORIDE 0.9 % IV SOLN
INTRAVENOUS | Status: DC
  Administered 2018-03-27 (×2): via INTRAVENOUS

## 2018-03-27 MED ORDER — DIPHENHYDRAMINE HCL 12.5 MG/5ML PO ELIX: 12.5000 mg | ORAL_SOLUTION | ORAL | Status: DC | PRN

## 2018-03-27 MED ORDER — ONDANSETRON HCL 4 MG/2ML IJ SOLN: 4.0000 mg | Freq: Four times a day (QID) | INTRAMUSCULAR | Status: DC | PRN

## 2018-03-27 MED ORDER — HYDROCODONE-ACETAMINOPHEN 5-325 MG PO TABS: 1.0000 | ORAL_TABLET | ORAL | Status: DC | PRN

## 2018-03-27 MED ORDER — DOCUSATE SODIUM 100 MG PO CAPS
100.0000 mg | ORAL_CAPSULE | Freq: Two times a day (BID) | ORAL | Status: DC
  Administered 2018-03-27 – 2018-03-28 (×2): 100 mg via ORAL
  Filled 2018-03-27 (×2): qty 1

## 2018-03-27 MED ORDER — CALCIUM POLYCARBOPHIL 625 MG PO TABS
625.0000 mg | ORAL_TABLET | Freq: Every day | ORAL | Status: DC
  Administered 2018-03-28: 625 mg via ORAL
  Filled 2018-03-27 (×2): qty 1

## 2018-03-27 MED ORDER — ACETAMINOPHEN 10 MG/ML IV SOLN
INTRAVENOUS | Status: AC
Start: 2018-03-27 — End: ?
  Filled 2018-03-27: qty 100

## 2018-03-27 MED ORDER — FAMOTIDINE 20 MG PO TABS: 20.0000 mg | ORAL_TABLET | Freq: Once | ORAL | Status: DC

## 2018-03-27 MED ORDER — ACETAMINOPHEN 325 MG PO TABS
650.0000 mg | ORAL_TABLET | Freq: Four times a day (QID) | ORAL | Status: DC
  Administered 2018-03-27 – 2018-03-28 (×3): 650 mg via ORAL
  Filled 2018-03-27 (×3): qty 2

## 2018-03-27 MED ORDER — FLEET ENEMA 7-19 GM/118ML RE ENEM: 1.0000 | ENEMA | Freq: Once | RECTAL | Status: DC | PRN

## 2018-03-27 MED ORDER — ONDANSETRON HCL 4 MG/2ML IJ SOLN
INTRAMUSCULAR | Status: DC | PRN
  Administered 2018-03-27: 4 mg via INTRAVENOUS

## 2018-03-27 MED ORDER — FOLIC ACID 1 MG PO TABS
2.0000 mg | ORAL_TABLET | Freq: Every day | ORAL | Status: DC
Start: 2018-03-27 — End: 2018-03-28
  Administered 2018-03-27 – 2018-03-28 (×2): 2 mg via ORAL
  Filled 2018-03-27 (×2): qty 2

## 2018-03-27 MED ORDER — VITAMIN B-12 1000 MCG PO TABS
1000.0000 ug | ORAL_TABLET | Freq: Every day | ORAL | Status: DC
  Administered 2018-03-28: 1000 ug via ORAL
  Filled 2018-03-27: qty 1

## 2018-03-27 MED ORDER — CLINDAMYCIN PHOSPHATE 900 MG/50ML IV SOLN
INTRAVENOUS | Status: AC
  Filled 2018-03-27: qty 50

## 2018-03-27 MED ORDER — FENTANYL CITRATE (PF) 100 MCG/2ML IJ SOLN
INTRAMUSCULAR | Status: DC | PRN
  Administered 2018-03-27: 25 ug via INTRAVENOUS
  Administered 2018-03-27: 75 ug via INTRAVENOUS

## 2018-03-27 MED ORDER — DEXAMETHASONE SODIUM PHOSPHATE 4 MG/ML IJ SOLN
INTRAMUSCULAR | Status: DC | PRN
  Administered 2018-03-27: 5 mg via INTRAVENOUS

## 2018-03-27 MED ORDER — AZELAIC ACID 15 % EX GEL: 1.0000 "application " | CUTANEOUS | Status: DC

## 2018-03-27 MED ORDER — BISACODYL 10 MG RE SUPP: 10.0000 mg | Freq: Every day | RECTAL | Status: DC | PRN

## 2018-03-27 MED ORDER — CALMME PO TABS: ORAL_TABLET | Freq: Every day | ORAL | Status: DC

## 2018-03-27 MED ORDER — VITAMIN D 1000 UNITS PO TABS
5000.0000 [IU] | ORAL_TABLET | Freq: Every day | ORAL | Status: DC
  Administered 2018-03-28: 5000 [IU] via ORAL
  Filled 2018-03-27: qty 5

## 2018-03-27 MED ORDER — DEXAMETHASONE SODIUM PHOSPHATE 10 MG/ML IJ SOLN
INTRAMUSCULAR | Status: AC
  Filled 2018-03-27: qty 1

## 2018-03-27 MED ORDER — LIDOCAINE HCL (CARDIAC) 20 MG/ML IV SOLN
INTRAVENOUS | Status: DC | PRN
  Administered 2018-03-27: 100 mg via INTRAVENOUS

## 2018-03-27 MED ORDER — PHILLIPS COLON HEALTH PO CAPS: ORAL_CAPSULE | Freq: Every day | ORAL | Status: DC

## 2018-03-27 MED ORDER — HYDROXYCHLOROQUINE SULFATE 200 MG PO TABS
200.0000 mg | ORAL_TABLET | Freq: Every day | ORAL | Status: DC
  Administered 2018-03-27 – 2018-03-28 (×2): 200 mg via ORAL
  Filled 2018-03-27 (×2): qty 1

## 2018-03-27 MED ORDER — ADULT MULTIVITAMIN W/MINERALS CH
1.0000 | ORAL_TABLET | Freq: Every day | ORAL | Status: DC
  Administered 2018-03-28: 1 via ORAL
  Filled 2018-03-27: qty 1

## 2018-03-27 MED ORDER — NEOMYCIN-POLYMYXIN B GU 40-200000 IR SOLN
Status: DC | PRN
  Administered 2018-03-27: 4 mL

## 2018-03-27 MED ORDER — ONDANSETRON HCL 4 MG/2ML IJ SOLN: 4.0000 mg | Freq: Once | INTRAMUSCULAR | Status: DC | PRN

## 2018-03-27 MED ORDER — CALCIUM CARBONATE-VITAMIN D 500-200 MG-UNIT PO TABS
1.0000 | ORAL_TABLET | Freq: Two times a day (BID) | ORAL | Status: DC
  Administered 2018-03-27 – 2018-03-28 (×2): 1 via ORAL
  Filled 2018-03-27 (×2): qty 1

## 2018-03-27 MED ORDER — GLUCOSAMINE SULFATE 1000 MG PO TABS: 1000.0000 mg | ORAL_TABLET | Freq: Every day | ORAL | Status: DC

## 2018-03-27 MED ORDER — FAMOTIDINE 20 MG PO TABS
ORAL_TABLET | ORAL | Status: AC
  Filled 2018-03-27: qty 1

## 2018-03-27 MED ORDER — METOCLOPRAMIDE HCL 10 MG PO TABS: 5.0000 mg | ORAL_TABLET | Freq: Three times a day (TID) | ORAL | Status: DC | PRN

## 2018-03-27 MED ORDER — SUGAMMADEX SODIUM 200 MG/2ML IV SOLN
INTRAVENOUS | Status: AC
  Filled 2018-03-27: qty 2

## 2018-03-27 MED ORDER — FENTANYL CITRATE (PF) 100 MCG/2ML IJ SOLN
25.0000 ug | INTRAMUSCULAR | Status: DC | PRN
  Administered 2018-03-27 (×4): 25 ug via INTRAVENOUS

## 2018-03-27 MED ORDER — PROPRANOLOL HCL 60 MG PO TABS
60.0000 mg | ORAL_TABLET | Freq: Once | ORAL | Status: AC
  Administered 2018-03-27: 60 mg via ORAL
  Filled 2018-03-27: qty 1

## 2018-03-27 MED ORDER — CELECOXIB 200 MG PO CAPS
200.0000 mg | ORAL_CAPSULE | Freq: Two times a day (BID) | ORAL | Status: DC
  Administered 2018-03-27 – 2018-03-28 (×2): 200 mg via ORAL
  Filled 2018-03-27 (×2): qty 1

## 2018-03-27 MED ORDER — CLINDAMYCIN PHOSPHATE 900 MG/50ML IV SOLN
900.0000 mg | Freq: Once | INTRAVENOUS | Status: AC
  Administered 2018-03-27: 900 mg via INTRAVENOUS

## 2018-03-27 MED ORDER — PROPOFOL 10 MG/ML IV BOLUS
INTRAVENOUS | Status: DC | PRN
  Administered 2018-03-27: 100 mg via INTRAVENOUS

## 2018-03-27 MED ORDER — CEFAZOLIN SODIUM-DEXTROSE 2-4 GM/100ML-% IV SOLN: 2.0000 g | Freq: Four times a day (QID) | INTRAVENOUS | Status: DC

## 2018-03-27 MED ORDER — GABAPENTIN 100 MG PO CAPS
200.0000 mg | ORAL_CAPSULE | Freq: Every day | ORAL | Status: DC
  Administered 2018-03-27: 200 mg via ORAL
  Filled 2018-03-27: qty 2

## 2018-03-27 MED ORDER — MAGNESIUM HYDROXIDE 400 MG/5ML PO SUSP: 30.0000 mL | Freq: Every day | ORAL | Status: DC | PRN

## 2018-03-27 MED ORDER — ACETAMINOPHEN 10 MG/ML IV SOLN
INTRAVENOUS | Status: DC | PRN
  Administered 2018-03-27: 1000 mg via INTRAVENOUS

## 2018-03-27 MED ORDER — CLINDAMYCIN PHOSPHATE 600 MG/50ML IV SOLN
600.0000 mg | Freq: Four times a day (QID) | INTRAVENOUS | Status: DC
  Administered 2018-03-27 – 2018-03-28 (×3): 600 mg via INTRAVENOUS
  Filled 2018-03-27 (×4): qty 50

## 2018-03-27 MED ORDER — METOCLOPRAMIDE HCL 5 MG/ML IJ SOLN: 5.0000 mg | Freq: Three times a day (TID) | INTRAMUSCULAR | Status: DC | PRN

## 2018-03-27 MED ORDER — ACETAMINOPHEN 325 MG PO TABS: 325.0000 mg | ORAL_TABLET | Freq: Four times a day (QID) | ORAL | Status: DC | PRN

## 2018-03-27 MED ORDER — LACTATED RINGERS IV SOLN
INTRAVENOUS | Status: DC
  Administered 2018-03-27: 100 mL/h via INTRAVENOUS

## 2018-03-27 MED ORDER — LIDOCAINE HCL (PF) 2 % IJ SOLN
INTRAMUSCULAR | Status: AC
  Filled 2018-03-27: qty 10

## 2018-03-27 MED ORDER — OMEGA-3-ACID ETHYL ESTERS 1 G PO CAPS
1.0000 g | ORAL_CAPSULE | Freq: Two times a day (BID) | ORAL | Status: DC
  Administered 2018-03-27 – 2018-03-28 (×2): 1 g via ORAL
  Filled 2018-03-27 (×2): qty 1

## 2018-03-27 MED ORDER — ONDANSETRON HCL 4 MG PO TABS: 4.0000 mg | ORAL_TABLET | Freq: Four times a day (QID) | ORAL | Status: DC | PRN

## 2018-03-27 MED ORDER — TRAMADOL HCL 50 MG PO TABS
50.0000 mg | ORAL_TABLET | Freq: Four times a day (QID) | ORAL | Status: DC
  Administered 2018-03-27 – 2018-03-28 (×2): 50 mg via ORAL
  Filled 2018-03-27 (×3): qty 1

## 2018-03-27 MED ORDER — PROPOFOL 10 MG/ML IV BOLUS
INTRAVENOUS | Status: AC
  Filled 2018-03-27: qty 20

## 2018-03-27 MED ORDER — BUPIVACAINE HCL (PF) 0.5 % IJ SOLN
INTRAMUSCULAR | Status: AC
  Filled 2018-03-27: qty 30

## 2018-03-27 MED ORDER — ROCURONIUM BROMIDE 100 MG/10ML IV SOLN
INTRAVENOUS | Status: DC | PRN
  Administered 2018-03-27: 15 mg via INTRAVENOUS
  Administered 2018-03-27: 5 mg via INTRAVENOUS

## 2018-03-27 MED ORDER — PHENYLEPHRINE HCL 10 MG/ML IJ SOLN
INTRAMUSCULAR | Status: DC | PRN
  Administered 2018-03-27 (×2): 100 ug via INTRAVENOUS

## 2018-03-27 MED ORDER — ONDANSETRON HCL 4 MG/2ML IJ SOLN
INTRAMUSCULAR | Status: AC
  Filled 2018-03-27: qty 2

## 2018-03-27 SURGICAL SUPPLY — 56 items
BANDAGE ACE 3X5.8 VEL STRL LF (GAUZE/BANDAGES/DRESSINGS) ×3 IMPLANT
BLADE SURG SZ10 CARB STEEL (BLADE) ×3 IMPLANT
BNDG COHESIVE 4X5 TAN STRL (GAUZE/BANDAGES/DRESSINGS) ×3 IMPLANT
BNDG ESMARK 4X12 TAN STRL LF (GAUZE/BANDAGES/DRESSINGS) ×3 IMPLANT
CANISTER SUCT 1200ML W/VALVE (MISCELLANEOUS) ×3 IMPLANT
CHLORAPREP W/TINT 26ML (MISCELLANEOUS) ×3 IMPLANT
CLOSURE WOUND 1/2 X4 (GAUZE/BANDAGES/DRESSINGS) ×1
CUFF TOURN 18 STER (MISCELLANEOUS) IMPLANT
CUFF TOURN 24 STER (MISCELLANEOUS) IMPLANT
DRAPE SHEET LG 3/4 BI-LAMINATE (DRAPES) ×3 IMPLANT
ELECT CAUTERY BLADE 6.4 (BLADE) ×3 IMPLANT
ELECT REM PT RETURN 9FT ADLT (ELECTROSURGICAL) ×3
ELECTRODE REM PT RTRN 9FT ADLT (ELECTROSURGICAL) ×1 IMPLANT
GAUZE PETRO XEROFOAM 1X8 (MISCELLANEOUS) ×3 IMPLANT
GAUZE SPONGE 4X4 12PLY STRL (GAUZE/BANDAGES/DRESSINGS) ×3 IMPLANT
GLOVE BIO SURGEON STRL SZ8 (GLOVE) ×6 IMPLANT
GLOVE BIOGEL M 7.0 STRL (GLOVE) ×6 IMPLANT
GLOVE BIOGEL PI IND STRL 7.5 (GLOVE) ×1 IMPLANT
GLOVE BIOGEL PI INDICATOR 7.5 (GLOVE) ×2
GLOVE INDICATOR 8.0 STRL GRN (GLOVE) ×6 IMPLANT
GLOVE SURG ORTHO 8.5 STRL (GLOVE) ×3 IMPLANT
GOWN STRL REUS W/ TWL LRG LVL3 (GOWN DISPOSABLE) ×1 IMPLANT
GOWN STRL REUS W/ TWL XL LVL3 (GOWN DISPOSABLE) ×1 IMPLANT
GOWN STRL REUS W/TWL LRG LVL3 (GOWN DISPOSABLE) ×2
GOWN STRL REUS W/TWL XL LVL3 (GOWN DISPOSABLE) ×2
KIT PREVENA INCISION MGT 13 (CANNISTER) ×3 IMPLANT
KIT TURNOVER KIT A (KITS) ×3 IMPLANT
LABEL OR SOLS (LABEL) ×3 IMPLANT
NDL SAFETY ECLIPSE 18X1.5 (NEEDLE) ×1 IMPLANT
NEEDLE FILTER BLUNT 18X 1/2SAF (NEEDLE) ×2
NEEDLE FILTER BLUNT 18X1 1/2 (NEEDLE) ×1 IMPLANT
NEEDLE HYPO 18GX1.5 SHARP (NEEDLE) ×2
NS IRRIG 1000ML POUR BTL (IV SOLUTION) ×3 IMPLANT
NS IRRIG 500ML POUR BTL (IV SOLUTION) ×3 IMPLANT
PACK EXTREMITY ARMC (MISCELLANEOUS) ×3 IMPLANT
PAD ABD DERMACEA PRESS 5X9 (GAUZE/BANDAGES/DRESSINGS) ×3 IMPLANT
PAD CAST CTTN 4X4 STRL (SOFTGOODS) ×1 IMPLANT
PADDING CAST COTTON 4X4 STRL (SOFTGOODS) ×2
SPLINT CAST 1 STEP 3X12 (MISCELLANEOUS) ×3 IMPLANT
SPONGE LAP 18X18 5 PK (GAUZE/BANDAGES/DRESSINGS) ×3 IMPLANT
STAPLER SKIN PROX 35W (STAPLE) ×3 IMPLANT
STOCKINETTE BIAS CUT 4 980044 (GAUZE/BANDAGES/DRESSINGS) ×3 IMPLANT
STOCKINETTE IMPERVIOUS 9X36 MD (GAUZE/BANDAGES/DRESSINGS) ×3 IMPLANT
STRIP CLOSURE SKIN 1/2X4 (GAUZE/BANDAGES/DRESSINGS) ×2 IMPLANT
SUT ETHILON 4-0 (SUTURE) ×2
SUT ETHILON 4-0 FS2 18XMFL BLK (SUTURE) ×1
SUT PROLENE 2 0 FS (SUTURE) ×3 IMPLANT
SUT VIC AB 2-0 CT1 36 (SUTURE) ×3 IMPLANT
SUT VIC AB 2-0 SH 27 (SUTURE) ×2
SUT VIC AB 2-0 SH 27XBRD (SUTURE) ×1 IMPLANT
SUT VIC AB 3-0 PS2 18 (SUTURE) ×3 IMPLANT
SUT VIC AB 4-0 SH 27 (SUTURE) ×2
SUT VIC AB 4-0 SH 27XANBCTRL (SUTURE) ×1 IMPLANT
SUT VICRYL+ 3-0 36IN CT-1 (SUTURE) ×3 IMPLANT
SUTURE ETHLN 4-0 FS2 18XMF BLK (SUTURE) ×1 IMPLANT
SYR 10ML LL (SYRINGE) ×3 IMPLANT

## 2018-03-27 NOTE — Op Note (Signed)
03/27/2018  3:35 PM  Patient:   [name]  Pre-Op Diagnosis:   Septic olecranon bursitis, left elbow.  Post-Op Diagnosis:   Same.  Procedure:   Irrigation and debridement with excision of septic olecranon bursa, left elbow.  Surgeon:   Maryagnes Amos, MD  Assistant:   None  Anesthesia:   General LMA  Findings:   As above.  Complications:   None  Fluids:   700 cc crystalloid  EBL:   10 cc  UOP:   None  TT:   23 minutes at 250 mmHg  Drains:   Praveena x1  Closure:   2-0 Prolene interrupted sutures  Brief Clinical Note:   The patient is an 84 year old female who fell onto her left upper extremity several months ago.  She was diagnosed with a partial tear of her left triceps tendon.  Initially, this was treated with a hinged range of motion brace, but the patient did not tolerate this well.  Consequently a posterior splint was applied.  She developed a small ulceration over the posterior aspect of her elbow.  This became infected.  It did not respond to p.o. antibiotics.  The patient presents at this time for irrigation and debridement of the septic left olecranon bursa with excision of the olecranon bursa and supplemental IV antibiotics.  Procedure:   The patient was brought into the operating room and lain in the supine position.  After adequate general laryngeal mask anesthesia was obtained, the patient's left upper extremity was prepped with ChloraPrep solution before being draped sterilely.  Preoperative antibiotics were administered.  A timeout was performed to verify the appropriate surgical site before the tourniquet was inflated to 250 mmHg.  The arm was held in elevated position for several minutes before inflating the tourniquet.  An approximately 3 cm incision was made extending proximally from the 1 cm diameter ulceration over the posterior olecranon.  In addition, the skin was debrided circumferentially from around the ulcerative site the olecranon bursa was  identified and debrided in its entirety.  Fibrinous and necrotic tissue was removed using sharp dissection and rongeurs.  The wound was copiously irrigated with 1 L of antibiotic irrigation using bulb irrigation.  With the elbow held at approximately 30 degrees of flexion, the skin could be closed in its entirety without undue tension over the posterior elbow region.  Given the patient's age and relatively frail state, it is felt best to try to obtain primary closure.  The wound was reapproximated using 2-0 Prolene interrupted sutures utilizing a near-far-far-near construct.  A Praveena wound VAC was applied over the posterior aspect of the elbow before a fiberglass splint was applied over the anterior aspect of the elbow, maintaining the elbow at approximately 25-30 degrees of flexion.  The patient was then awakened, extubated, and returned to the recovery room in satisfactory condition after tolerating the procedure well.

## 2018-03-27 NOTE — H&P (Signed)
Paper H&P to be scanned into permanent record. H&P reviewed and patient re-examined. No changes. 

## 2018-03-27 NOTE — Anesthesia Post-op Follow-up Note (Signed)
Anesthesia QCDR form completed.        

## 2018-03-27 NOTE — Anesthesia Procedure Notes (Signed)
Procedure Name: LMA Insertion Date/Time: 03/27/2018 2:26 PM Performed by: Darrol Jump, CRNA Pre-anesthesia Checklist: Patient identified, Emergency Drugs available, Suction available and Patient being monitored Patient Re-evaluated:Patient Re-evaluated prior to induction Oxygen Delivery Method: Circle system utilized Preoxygenation: Pre-oxygenation with 100% oxygen (blowby refused mask) Induction Type: IV induction LMA Size: 3.5 Number of attempts: 1 Tube secured with: Tape Dental Injury: Teeth and Oropharynx as per pre-operative assessment

## 2018-03-27 NOTE — Anesthesia Preprocedure Evaluation (Addendum)
Anesthesia Evaluation  Patient identified by MRN, date of birth, ID band Patient awake    Reviewed: Allergy & Precautions, H&P , NPO status , Patient's Chart, lab work & pertinent test results, reviewed documented beta blocker date and time   Airway Mallampati: II  TM Distance: >3 FB Neck ROM: full    Dental  (+) Dental Advidsory Given, Teeth Intact   Pulmonary neg pulmonary ROS,           Cardiovascular Exercise Tolerance: Good hypertension, (-) angina(-) CAD, (-) Past MI, (-) Cardiac Stents and (-) CABG + dysrhythmias (complete heart block) + pacemaker + Valvular Problems/Murmurs      Neuro/Psych negative neurological ROS  negative psych ROS   GI/Hepatic Neg liver ROS, GERD  ,  Endo/Other  negative endocrine ROS  Renal/GU negative Renal ROS  negative genitourinary   Musculoskeletal   Abdominal   Peds  Hematology negative hematology ROS (+)   Anesthesia Other Findings Past Medical History: No date: Anemia No date: GERD (gastroesophageal reflux disease) No date: Hypertension No date: Peripheral vascular disease (HCC)     Comment:  possible neuropathies in lower extremeties No date: Syncope   Reproductive/Obstetrics negative OB ROS                             Anesthesia Physical Anesthesia Plan  ASA: IV  Anesthesia Plan: General   Post-op Pain Management:    Induction: Intravenous  PONV Risk Score and Plan: 3 and Ondansetron, Dexamethasone and Treatment may vary due to age or medical condition  Airway Management Planned: LMA  Additional Equipment:   Intra-op Plan:   Post-operative Plan: Extubation in OR  Informed Consent: I have reviewed the patients History and Physical, chart, labs and discussed the procedure including the risks, benefits and alternatives for the proposed anesthesia with the patient or authorized representative who has indicated his/her understanding and  acceptance.   Dental Advisory Given  Plan Discussed with: Anesthesiologist, CRNA and Surgeon  Anesthesia Plan Comments:         Anesthesia Quick Evaluation

## 2018-03-27 NOTE — Progress Notes (Signed)
PHARMACIST - PHYSICIAN ORDER COMMUNICATION  CONCERNING: P&T Medication Policy on Herbal Medications  DESCRIPTION:  This patient's order for:  Glucoasmine sulfate, CalmMe tabs and [employer] has been noted.  This product(s) is classified as an "herbal" or natural product. Due to a lack of definitive safety studies or FDA approval, nonstandard manufacturing practices, plus the potential risk of unknown drug-drug interactions while on inpatient medications, the Pharmacy and Therapeutics Committee does not permit the use of "herbal" or natural products of this type within [facility].   ACTION TAKEN: The pharmacy department is unable to verify this order at this time and your patient has been informed of this safety policy. Please reevaluate patient's clinical condition at discharge and address if the herbal or natural product(s) should be resumed at that time.

## 2018-03-27 NOTE — Transfer of Care (Signed)
Immediate Anesthesia Transfer of Care Note  Patient: [name]  Procedure(s) Performed: LEFT OLECRANON BURSA (Left ) IRRIGATION AND DEBRIDEMENT LEFT ELBOW / OLECRANON BURSA (Left )  Patient Location: PACU  Anesthesia Type:General  Level of Consciousness: awake, alert  and oriented  Airway & Oxygen Therapy: Patient connected to face mask oxygen  Post-op Assessment: Post -op Vital signs reviewed and stable  Post vital signs: stable  Last Vitals:  Vitals Value Taken Time  BP 133/62 03/27/2018  3:29 PM  Temp 36.8 C 03/27/2018  3:29 PM  Pulse 81 03/27/2018  3:30 PM  Resp 16 03/27/2018  3:30 PM  SpO2 100 % 03/27/2018  3:30 PM  Vitals shown include unvalidated device data.  Last Pain:  Vitals:   03/27/18 1529  TempSrc: Temporal  PainSc:       Patients Stated Pain Goal: 0 (03/27/18 1340)  Complications: No apparent anesthesia complications

## 2018-03-28 ENCOUNTER — Encounter: Payer: Self-pay | Admitting: Surgery

## 2018-03-28 DIAGNOSIS — M7022 Olecranon bursitis, left elbow: Secondary | ICD-10-CM | POA: Diagnosis not present

## 2018-03-28 LAB — BASIC METABOLIC PANEL
Anion gap: 6 (ref 5–15)
BUN: 20 mg/dL (ref 6–20)
CHLORIDE: 96 mmol/L — AB (ref 101–111)
CO2: 24 mmol/L (ref 22–32)
CREATININE: 1.07 mg/dL — AB (ref 0.44–1.00)
Calcium: 8.3 mg/dL — ABNORMAL LOW (ref 8.9–10.3)
GFR calc Af Amer: 53 mL/min — ABNORMAL LOW (ref >60)
GFR calc non Af Amer: 46 mL/min — ABNORMAL LOW (ref >60)
GLUCOSE: 167 mg/dL — AB (ref 65–99)
POTASSIUM: 5 mmol/L (ref 3.5–5.1)
Sodium: 126 mmol/L — ABNORMAL LOW (ref 135–145)

## 2018-03-28 MED ORDER — CEPHALEXIN 500 MG PO CAPS: 500.0000 mg | ORAL_CAPSULE | Freq: Four times a day (QID) | ORAL | 0 refills | Status: AC

## 2018-03-28 MED ORDER — HYDROCODONE-ACETAMINOPHEN 5-325 MG PO TABS: 0.5000 | ORAL_TABLET | ORAL | 0 refills | Status: DC | PRN

## 2018-03-28 NOTE — Care Management (Signed)
Spoke with Cranston Neighbor, PA. To discuss recommendations of PT He does not feel PT is appropriate at this time. Will not have PT follow up at home.

## 2018-03-28 NOTE — Evaluation (Signed)
Physical Therapy Evaluation Patient Details Name: [name] MRN: [id number] DOB: 22-Jan-1930 Today's Date: 03/28/2018   History of Present Illness  Pt is an 84 yo F diagnosed with septic olecranon bursitis of the left elbow and is s/p irrigation and debridement with excision of septic olecranon bursa.  PMH includes: HTN, PVD, syncope, possible neuropathy, ACDF C4-6, and multiple back surgeries.      Clinical Impression  Pt presents with deficits in strength, transfers, mobility, gait, balance, and activity tolerance.  Pt was CGA with transfers with cues to prevent pushing up with LUE.  Pt was able to amb 1 x 30' and 1 x 150' with very slow cadence with cues to gently rest LUE on RW with no weight bearing as a precaution.  Pt was anxious ascending and descending 4 stairs with RUE on the rail and required min verbal cues for sequencing but was steady without LOB.  Pt will benefit from HHPT services upon discharge to safely address above deficits for decreased caregiver assistance and eventual return to PLOF.      Follow Up Recommendations Home health PT    Equipment Recommendations  None recommended by PT    Recommendations for Other Services       Precautions / Restrictions Precautions Precautions: Fall Required Braces or Orthoses: Other Brace/Splint Other Brace/Splint: LUE splint and ace bandage Restrictions Weight Bearing Restrictions: (None noted in chart)      Mobility  Bed Mobility               General bed mobility comments: NT with pt up in recliner  Transfers Overall transfer level: Needs assistance Equipment used: Rolling walker (2 wheeled) Transfers: Sit to/from Stand Sit to Stand: Min guard         General transfer comment: MIn verbal cues for sequencing to prevent use of LUE  Ambulation/Gait Ambulation/Gait assistance: Min guard Ambulation Distance (Feet): 150 Feet Assistive device: Rolling walker (2 wheeled) Gait Pattern/deviations:  Step-through pattern;Decreased step length - right;Decreased step length - left     General Gait Details: Verbal cues to rest LUE on RW but not to bear weight; Slow cadence with gait with short B step length but no LOB; min verbal cues for amb closer to [employer] Stairs: Yes Stairs assistance: Min guard Stair Management: One rail Right Number of Stairs: 4 General stair comments: Slow, cautious cadence up/down stairs with one rail with min verbal cues for sequencing to prevent pt from using LUE on rail  Wheelchair Mobility    Modified Rankin (Stroke Patients Only)       Balance Overall balance assessment: Mild deficits observed, not formally tested                                           Pertinent Vitals/Pain Pain Assessment: 0-10 Pain Score: 1  Pain Location: LUE Pain Descriptors / Indicators: Sore Pain Intervention(s): Premedicated before session;Monitored during session    Home Living Family/patient expects to be discharged to:: Private residence Living Arrangements: Spouse/significant other Available Help at Discharge: Family;Personal care attendant;Other (Comment)(Pt has a PCA as needed 7x/wk) Type of Home: House Home Access: Stairs to enter Entrance Stairs-Rails: Right Entrance Stairs-Number of Steps: 4 Home Layout: Two level;Able to live on main level with bedroom/bathroom Home Equipment: Dan Humphreys - 2 wheels;Cane - single point      Prior Function Level of Independence: Independent  Comments: Ind Amb without AD limited community distances, Ind with ADLs, one fall in the last year tripped up by dog     Hand Dominance        Extremity/Trunk Assessment   Upper Extremity Assessment Upper Extremity Assessment: LUE deficits/detail LUE: Unable to fully assess due to immobilization LUE Sensation: WNL    Lower Extremity Assessment Lower Extremity Assessment: Generalized weakness       Communication   Communication: No  difficulties  Cognition Arousal/Alertness: Awake/alert Behavior During Therapy: WFL for tasks assessed/performed Overall Cognitive Status: Within Functional Limits for tasks assessed                                        General Comments      Exercises     Assessment/Plan    PT Assessment Patient needs continued PT services  PT Problem List Decreased strength;Decreased activity tolerance;Decreased balance       PT Treatment Interventions DME instruction;Gait training;Stair training;Functional mobility training;Balance training;Therapeutic exercise;Therapeutic activities;Patient/family education    PT Goals (Current goals can be found in the Care Plan section)  Acute Rehab PT Goals Patient Stated Goal: To walk better with better balance PT Goal Formulation: With patient Time For Goal Achievement: 04/10/18 Potential to Achieve Goals: Good    Frequency Min 2X/week   Barriers to discharge        Co-evaluation               AM-PAC PT "6 Clicks" Daily Activity  Outcome Measure Difficulty turning over in bed (including adjusting bedclothes, sheets and blankets)?: A Little Difficulty moving from lying on back to sitting on the side of the bed? : A Little Difficulty sitting down on and standing up from a chair with arms (e.g., wheelchair, bedside commode, etc,.)?: Unable Help needed moving to and from a bed to chair (including a wheelchair)?: A Little Help needed walking in hospital room?: A Little Help needed climbing 3-5 steps with a railing? : A Little 6 Click Score: 16    End of Session Equipment Utilized During Treatment: Gait belt Activity Tolerance: Patient tolerated treatment well;No increased pain Patient left: in chair;with call bell/phone within reach Nurse Communication: Mobility status PT Visit Diagnosis: Muscle weakness (generalized) (M62.81);Difficulty in walking, not elsewhere classified (R26.2)    Time: 2876-8115 PT Time  Calculation (min) (ACUTE ONLY): 43 min   Charges:   PT Evaluation $PT Eval Low Complexity: 1 Low PT Treatments $Gait Training: 8-22 mins   PT G Codes:        DElly Modena PT, DPT 03/28/18, 11:43 AM

## 2018-03-28 NOTE — Discharge Summary (Signed)
Physician Discharge Summary  Patient ID: [name] MRN: [id number] DOB/AGE: [birth year] 82 y.o.  Admit date: 03/27/2018 Discharge date: 03/28/2018  Admission Diagnoses:  septic olecranon bursitis of left elbow   Discharge Diagnoses: Patient Active Problem List   Diagnosis Date Noted  . Septic olecranon bursitis of left elbow 03/27/2018  . Lymphedema 10/11/2017  . Chronic venous insufficiency 10/11/2017  . Leg pain 10/11/2017  . Leg swelling 10/11/2017  . COPD (chronic obstructive pulmonary disease) (HCC) 10/11/2017  . Essential hypertension 10/11/2017    Past Medical History:  Diagnosis Date  . Anemia   . GERD (gastroesophageal reflux disease)   . Hypertension   . Peripheral vascular disease (HCC)    possible neuropathies in lower extremeties  . PONV (postoperative nausea and vomiting)    happens sometimes but better with pre med of zofran  . Syncope      Transfusion: none   Consultants (if any):   Discharged Condition: Improved  Hospital Course: [name] is an 84 y.o. female who was admitted 03/27/2018 with a diagnosis of left elbow septic olecranon bursa and went to the operating room on 03/27/2018 and underwent the above named procedures.    Surgeries: Procedure(s): LEFT OLECRANON BURSA IRRIGATION AND DEBRIDEMENT LEFT ELBOW / OLECRANON BURSA on 03/27/2018 Patient tolerated the surgery well. Taken to PACU where she was stabilized and then transferred to the orthopedic floor.  Patient was given IV antibiotics.  On postop day 1 cultures are pending.  She was switched over to oral cephalexin.  Wound VAC was applied to the left elbow along with a splint.  No drainage on postop day 1 and wound VAC system.   on postop day 1 pain well-controlled..   She was given perioperative antibiotics:  Anti-infectives (From admission, onward)   Start     Dose/Rate Route Frequency Ordered Stop   03/28/18 0000  cephALEXin (KEFLEX) 500 MG capsule     500 mg Oral 4 times daily  03/28/18 0803 04/07/18 2359   03/27/18 1845  hydroxychloroquine (PLAQUENIL) tablet 200 mg     200 mg Oral Daily 03/27/18 1839     03/27/18 1800  clindamycin (CLEOCIN) IVPB 600 mg     600 mg 100 mL/hr over 30 Minutes Intravenous Every 6 hours 03/27/18 1545 03/28/18 1759   03/27/18 1600  ceFAZolin (ANCEF) IVPB 2g/100 mL premix  Status:  Discontinued     2 g 200 mL/hr over 30 Minutes Intravenous Every 6 hours 03/27/18 1554 03/27/18 1554   03/27/18 1415  clindamycin (CLEOCIN) IVPB 900 mg     900 mg 100 mL/hr over 30 Minutes Intravenous  Once 03/27/18 1406 03/27/18 1451   03/27/18 1224  clindamycin (CLEOCIN) 900 MG/50ML IVPB    Note to Pharmacy:  Dellis Filbert   : cabinet override      03/27/18 1224 03/27/18 1436    .   She benefited maximally from the hospital stay and there were no complications.    Recent vital signs:  Vitals:   03/27/18 2313 03/28/18 0323  BP: 118/66 (!) 145/56  Pulse: 77 66  Resp: 20 19  Temp: 98.6 F (37 C) 97.8 F (36.6 C)  SpO2: 97% 97%    Recent laboratory studies:  No results found for: HGB No results found for: WBC, PLT No results found for: INR Lab Results  Component Value Date   NA 126 (L) 03/28/2018   K 5.0 03/28/2018   CL 96 (L) 03/28/2018   CO2 24 03/28/2018  BUN 20 03/28/2018   CREATININE 1.07 (H) 03/28/2018   GLUCOSE 167 (H) 03/28/2018    Discharge Medications:   Allergies as of 03/28/2018      Reactions   Codeine Nausea And Vomiting   Doxycycline Other (See Comments)   Flu-like s/s   Morphine Nausea And Vomiting, Anxiety   Hallucinations and anxiety   Tramadol Nausea And Vomiting, Other (See Comments)   Causes poor balance   Adhesive [tape] Rash   Amitriptyline Other (See Comments)   Caused nervousness. Patient states she has never taken this.   Ciprofloxacin Diarrhea   Clarithromycin Nausea Only   Flu like symptoms   Erythromycin Rash   Itraconazole Other (See Comments)   Elevated liver enzymes   Nsaids Nausea Only    Unknown   Penicillins Rash   Hives also. Doctor told her to never ever take pcn unless it is her only option   Zoloft [sertraline Hcl] Other (See Comments)   Patient unaware of any reaction. Probably upset stomach      Medication List    STOP taking these medications   sulfamethoxazole-trimethoprim 800-160 MG tablet Commonly known as:  BACTRIM DS,SEPTRA DS     TAKE these medications   acetaminophen 650 MG CR tablet Commonly known as:  TYLENOL Take 1,300 mg by mouth 2 (two) times daily.   Calcium-D 600-400 MG-UNIT Tabs Take 1 tablet by mouth 2 (two) times daily.   CALMME PO Take 1 Package by mouth daily.   cephALEXin 500 MG capsule Commonly known as:  KEFLEX Take 1 capsule (500 mg total) by mouth 4 (four) times daily for 10 days.   cyanocobalamin 1000 MCG/ML injection Commonly known as:  (VITAMIN B-12) Inject 1,000 mcg into the muscle every 30 (thirty) days.   vitamin B-12 1000 MCG tablet Commonly known as:  CYANOCOBALAMIN Take 1,000 mcg by mouth daily.   FIBER-CAPS PO Take 1 capsule by mouth at bedtime.   FINACEA 15 % cream Generic drug:  Azelaic Acid Apply 1 application topically every other day.   FISH OIL PO Take 1,000 mg by mouth 2 (two) times daily.   folic acid 1 MG tablet Commonly known as:  FOLVITE Take 2 mg by mouth daily.   gabapentin 100 MG capsule Commonly known as:  NEURONTIN Take 200 mg by mouth at bedtime.   Glucosamine Sulfate 1000 MG Tabs Take 1,000 mg by mouth daily.   HYDROcodone-acetaminophen 5-325 MG tablet Commonly known as:  NORCO/VICODIN Take 0.5-1 tablets by mouth every 4 (four) hours as needed for moderate pain (pain score 4-6).   hydroxychloroquine 200 MG tablet Commonly known as:  PLAQUENIL Take 200 mg by mouth daily.   methotrexate 50 MG/2ML injection Inject 25 mg into the muscle every Saturday.   multivitamin tablet Take 1 tablet by mouth daily.   omeprazole 40 MG capsule Commonly known as:  PRILOSEC Take 40 mg by  mouth daily.   PHILLIPS COLON HEALTH PO Take 1 tablet by mouth daily.   propranolol ER 60 MG 24 hr capsule Commonly known as:  INDERAL LA Take 60 mg by mouth daily.   RETIN-A 0.05 % cream Generic drug:  tretinoin Apply 1 application topically every other day.   Vitamin D-3 5000 units Tabs Take 5,000 Units by mouth daily.       Diagnostic Studies: No results found.  Disposition:     Follow-up Information    Anson Oregon, PA-C. Call on 04/02/2018.   Specialty:  Physician Assistant Why:  Please  call the office to schedule appointment to be seen on 04/02/2018.  At this visit we will remove the dressing and wound VAC system to check your wound Contact information: [address] [place] [address] [phone]            Signed: Amador Cunas CHRISTOPHER 03/28/2018, 8:08 AM

## 2018-03-28 NOTE — Discharge Instructions (Signed)
Diet: As you were doing prior to hospitalization   Shower: To not shower until after your first postop visit.  Dressing: Keep splint and wound VAC system on at all times.   To prevent constipation: you may use a stool softener such as -  Colace (over the counter) 100 mg by mouth twice a day  Drink plenty of fluids (prune juice may be helpful) and high fiber foods Miralax (over the counter) for constipation as needed.    Itching:  If you experience itching with your medications, try taking only a single pain pill, or even half a pain pill at a time.  You may take up to 10 pain pills per day, and you can also use benadryl over the counter for itching or also to help with sleep.   Precautions:  If you experience chest pain or shortness of breath - call 911 immediately for transfer to the hospital emergency department!!  If you develop a fever greater that 101 F, purulent drainage from wound, increased redness or drainage from wound, or calf pain-Call [facility]                                              Follow- Up Appointment:  Please call for an appointment to be seen on 04/02/2018

## 2018-03-28 NOTE — Care Management Obs Status (Signed)
MEDICARE OBSERVATION STATUS NOTIFICATION   Patient Details  Name: [name] MRN: [id number] Date of Birth: November 05, 1930   Medicare Observation Status Notification Given:  Yes    Marily Memos, RN 03/28/2018, 11:54 AM

## 2018-03-28 NOTE — Clinical Social Work Note (Signed)
CSW received referral for SNF.  Case discussed with case manager and plan is to discharge home with home health.  CSW to sign off please re-consult if social work needs arise.  Aleli Navedo R. Karrah Mangini, MSW, LCSWA [phone]  

## 2018-03-28 NOTE — Progress Notes (Signed)
   Subjective: 1 Day Post-Op Procedure(s) (LRB): LEFT OLECRANON BURSA (Left) IRRIGATION AND DEBRIDEMENT LEFT ELBOW / OLECRANON BURSA (Left) Patient reports pain as mild.   Patient is doing well but complaining of discomfort from the splint along the radial aspect of her wrist. Denies any CP, SOB, ABD pain. We will continue therapy today.  Plan is to go Home after hospital stay.  Objective: Vital signs in last 24 hours: Temp:  [97.8 F (36.6 C)-98.9 F (37.2 C)] 97.8 F (36.6 C) (04/17 0323) Pulse Rate:  [66-90] 66 (04/17 0323) Resp:  [12-22] 19 (04/17 0323) BP: (100-153)/(53-77) 145/56 (04/17 0323) SpO2:  [95 %-100 %] 97 % (04/17 0323) Weight:  [54.4 kg (120 lb)] 54.4 kg (120 lb) (04/16 1810)  Intake/Output from previous day: 04/16 0701 - 04/17 0700 In: 1000 [I.V.:1000] Out: -  Intake/Output this shift: No intake/output data recorded.  No results for input(s): HGB in the last 72 hours. No results for input(s): WBC, RBC, HCT, PLT in the last 72 hours. Recent Labs    03/28/18 0306  NA 126*  K 5.0  CL 96*  CO2 24  BUN 20  CREATININE 1.07*  GLUCOSE 167*  CALCIUM 8.3*   No results for input(s): LABPT, INR in the last 72 hours.  EXAM General - Patient is Alert, Appropriate and Oriented Left upper extremity - Neurovascular intact Sensation intact distally Intact pulses distally No cellulitis present Compartment soft  Able to make a fist.  Minimal swelling throughout the hand. 2+ radial pulse Dressing - dressing C/D/I, no drainage and Wound VAC is intact with no drainage Motor Function - intact, moving wrist and digits well on exam   Past Medical History:  Diagnosis Date  . Anemia   . GERD (gastroesophageal reflux disease)   . Hypertension   . Peripheral vascular disease (HCC)    possible neuropathies in lower extremeties  . PONV (postoperative nausea and vomiting)    happens sometimes but better with pre med of zofran  . Syncope     Assessment/Plan:   1  Day Post-Op Procedure(s) (LRB): LEFT OLECRANON BURSA (Left) IRRIGATION AND DEBRIDEMENT LEFT ELBOW / OLECRANON BURSA (Left) Active Problems:   Septic olecranon bursitis of left elbow  Estimated body mass index is 23.44 kg/m as calculated from the following:   Height as of this encounter: 5' (1.524 m).   Weight as of this encounter: 54.4 kg (120 lb). Advance diet  Splint removed, cast padding applied under the splint.  New Ace wrap applied Cultures pending Can discharge home with oral cephalexin Wound VAC is intact with no drainage Follow-up with [facility] 04/02/2018 for wound check     T. Cranston Neighbor, PA-C [facility] 03/28/2018, 7:56 AM

## 2018-03-29 ENCOUNTER — Encounter: Payer: Medicare Other | Admitting: Physical Therapy

## 2018-03-29 NOTE — Anesthesia Postprocedure Evaluation (Signed)
Anesthesia Post Note  Patient: [name]  Procedure(s) Performed: LEFT OLECRANON BURSA (Left ) IRRIGATION AND DEBRIDEMENT LEFT ELBOW / OLECRANON BURSA (Left )  Patient location during evaluation: PACU Anesthesia Type: General Level of consciousness: awake and alert Pain management: pain level controlled Vital Signs Assessment: post-procedure vital signs reviewed and stable Respiratory status: spontaneous breathing, nonlabored ventilation, respiratory function stable and patient connected to nasal cannula oxygen Cardiovascular status: blood pressure returned to baseline and stable Postop Assessment: no apparent nausea or vomiting Anesthetic complications: no     Last Vitals:  Vitals:   03/28/18 0323 03/28/18 0835  BP: (!) 145/56 128/70  Pulse: 66 67  Resp: 19 18  Temp: 36.6 C 36.6 C  SpO2: 97% 97%    Last Pain:  Vitals:   03/28/18 0835  TempSrc: Oral  PainSc:                  Lenard Simmer

## 2018-03-30 NOTE — Progress Notes (Signed)
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 Exposed exposed. There is no tunneling or undermining noted. There is a large amount of serous drainage noted. The wound margin is flat and intact. There is small (1-33%) red granulation within the wound bed. There is a large (67-100%) amount of necrotic tissue within the wound bed including Eschar and Adherent Slough. The periwound skin appearance did not exhibit: Callus, Crepitus, Excoriation, Induration, Rash, Scarring, Dry/Scaly, Maceration, Atrophie Blanche, Cyanosis, Ecchymosis, Hemosiderin Staining, Mottled, Pallor, Rubor, Erythema. Periwound temperature was noted as No Abnormality. The periwound has tenderness on palpation. Wound #8 status is Open. Original cause of wound was Trauma. The wound is located on the Left,Distal Lower Leg. The wound measures 1cm length x 1.6cm width x 0.1cm depth; 1.257cm^2 area and 0.126cm^3 volume. There is no tunneling or undermining noted. There is a medium amount of serous drainage noted. The wound margin is flat and intact. There is medium (34-66%) red granulation  within the wound bed. There is a medium (34-66%) amount of necrotic tissue within the wound bed including Adherent Slough. The periwound skin appearance did not exhibit: Callus, Crepitus, Excoriation, Induration, Rash, Scarring, Dry/Scaly, Maceration, Atrophie Blanche, Cyanosis, Ecchymosis, Hemosiderin Staining, Mottled, Pallor, Rubor, Erythema. Periwound temperature was noted as No Abnormality. 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 There was a Selective/Open Wound Non-Viable Tissue Debridement with a total area of 0.4 sq cm performed by STONE III, HOYT E., PA-C. With the following instrument(s): Forceps, and Scissors. to remove Non-Viable tissue/material Material removed includes Eschar after achieving pain control using Other (lidocaine 4%). No specimens were taken. A time out was conducted at 15:28, prior to the start of the procedure. A Minimum amount of bleeding was controlled with Pressure. The procedure was tolerated well with a pain level of 0 throughout and a pain level of 0 following the procedure. Post Debridement Measurements: 1cm length x 2cm width x 0.1cm depth; 0.157cm^3 volume. Character of Wound/Ulcer Post Debridement is stable. Post procedure Diagnosis Wound #8: Same as Pre-Procedure Plan Wound Cleansing: Wound #5 Right,Anterior Lower Leg: Clean wound with Normal Saline. Wound #7 Left,Proximal Lower Leg: Clean wound with Normal Saline. Wound #8 Left,Distal Lower Leg: Clean wound with Normal Saline. Anesthetic (add  to Medication List): Wound #5 Right,Anterior Lower Leg: Topical Lidocaine 4% cream applied to wound bed prior to debridement (In Clinic Only). Wound #7 Left,Proximal Lower Leg: Topical Lidocaine  4% cream applied to wound bed prior to debridement (In Clinic Only). Wound #8 Left,Distal Lower Leg: Topical Lidocaine 4% cream applied to wound bed prior to debridement (In Clinic Only). Primary Wound Dressing: Wound #5 Right,Anterior Lower Leg: Other: - endoform Wound #7 Left,Proximal Lower Leg: Xeroform Wound #8 Left,Distal Lower Leg: Xeroform [name], [name] ([id number]) Secondary Dressing: Wound #5 Right,Anterior Lower Leg: Boardered Foam Dressing Wound #7 Left,Proximal Lower Leg: Boardered Foam Dressing Wound #8 Left,Distal Lower Leg: Boardered Foam Dressing Dressing Change Frequency: Wound #5 Right,Anterior Lower Leg: Change dressing every other day. Follow-up Appointments: Wound #5 Right,Anterior Lower Leg: Return Appointment in 1 week. Additional Orders / Instructions: Wound #5 Right,Anterior Lower Leg: Increase protein intake. I am going to suggest at this point that we continue with the Current wound care measures for the right lower extremity. I'm gonna initiate a Xeroform gauze dressing for the left lower extremity ulcers over the next week. Patient is in agreement with this plan. We will subsequently see were things stand in one weeks time. Please see above for specific wound care orders. We will see patient for re-evaluation in 1 week(s) here in the clinic. If anything worsens or changes patient will contact our office for additional recommendations. Electronic Signature(s) Signed: 03/26/2018 5:02:11 PM By: Lenda Kelp PA-C Entered By: Lenda Kelp on 03/26/2018 16:24:57 [name] ([id number]) -------------------------------------------------------------------------------- ROS/PFSH Details Patient Name: [name] Date of Service: 03/26/2018 2:30  PM Medical Record Number: [id number] Patient Account Number: [id number] Date of Birth/Sex: [birth year] (84 y.o. F) Treating RN: Renne Crigler Primary Care Provider: Aram Beecham Other Clinician: Referring Provider: Aram Beecham Treating Provider/Extender: Linwood Dibbles, HOYT Weeks in Treatment: 18 Information Obtained From Patient Wound History Do you currently have one or more open woundso Yes How many open wounds do you currently haveo 1 Approximately how long have you had your woundso 10 weeks How have you been treating your wound(s) until nowo mupirocin Has your wound(s) ever healed and then re-openedo No Have you had any lab work done in the past montho No Have you tested positive for an antibiotic resistant organism (MRSA, VRE)o No Have you tested positive for osteomyelitis (bone infection)o No Have you had any tests for circulation on your legso Yes Who ordered the testo dr schnier [facility] Have you had other problems associated with your woundso Swelling Constitutional Symptoms (General Health) Complaints and Symptoms: Negative for: Fever; Chills Medical History: Past Medical History Notes: Back pain Eyes Medical History: Negative for: Cataracts; Glaucoma; Optic Neuritis Ear/Nose/Mouth/Throat Medical History: Negative for: Chronic sinus problems/congestion; Middle ear problems Past Medical History Notes: bilateral hearing aides Hematologic/Lymphatic Medical History: Negative for: Anemia; Hemophilia; Human Immunodeficiency Virus; Lymphedema; Sickle Cell Disease Respiratory Complaints and Symptoms: No Complaints or Symptoms Medical History: Negative for: Aspiration; Asthma; Chronic Obstructive Pulmonary Disease (COPD); Pneumothorax; Sleep Apnea; [name], [name] ([id number]) Tuberculosis Cardiovascular Complaints and Symptoms: No Complaints or Symptoms Medical History: Positive for: Hypertension Negative for: Angina; Arrhythmia;  Congestive Heart Failure; Coronary Artery Disease; Deep Vein Thrombosis; Hypotension; Myocardial Infarction; Peripheral Arterial Disease; Peripheral Venous Disease; Phlebitis; Vasculitis Past Medical History Notes: pacemaker Gastrointestinal Medical History: Negative for: Cirrhosis ; Colitis; Crohnos; Hepatitis A; Hepatitis B; Hepatitis C Endocrine Medical History: Negative for: Type I Diabetes; Type II Diabetes Genitourinary Medical History: Negative for: End Stage Renal Disease Immunological Medical History: Negative for: Lupus Erythematosus; Raynaudos; Scleroderma Musculoskeletal Medical History: Positive for: Gout; Osteoarthritis Negative for: Rheumatoid Arthritis; Osteomyelitis Neurologic Medical History: Positive for:  Neuropathy - lower legs Negative for: Dementia; Quadriplegia; Paraplegia; Seizure Disorder Oncologic Medical History: Negative for: Received Chemotherapy; Received Radiation Psychiatric Complaints and Symptoms: No Complaints or Symptoms Medical History: Negative for: Anorexia/bulimia; Confinement Anxiety [name], [name] ([id number]) Immunizations Pneumococcal Vaccine: Received Pneumococcal Vaccination: Yes Implantable Devices Family and Social History Cancer: Yes - Mother,Siblings; Diabetes: Yes - Father; Heart Disease: Yes - Father,Siblings; Hereditary Spherocytosis: No; Hypertension: Yes - Father,Siblings; Kidney Disease: No; Lung Disease: Yes - Siblings; Seizures: No; Stroke: No; Thyroid Problems: No; Tuberculosis: No; Never smoker; Marital Status - Married; Alcohol Use: Never; Drug Use: No History; Caffeine Use: Never; Financial Concerns: No; Food, Clothing or Shelter Needs: No; Support System Lacking: No; Transportation Concerns: No; Advanced Directives: No; Patient does not want information on Advanced Directives; Medical Power of Attorney: Yes - [name] (Copy provided) Physician Affirmation I have reviewed and agree with the above  information. Electronic Signature(s) Signed: 03/26/2018 5:02:11 PM By: Lenda Kelp PA-C Signed: 03/26/2018 5:03:04 PM By: Renne Crigler Entered By: Lenda Kelp on 03/26/2018 16:21:53 [name] ([id number]) -------------------------------------------------------------------------------- SuperBill Details Patient Name: [name] Date of Service: 03/26/2018 Medical Record Number: [id number] Patient Account Number: [id number] Date of Birth/Sex: February 02, 1930 (84 y.o. F) Treating RN: Renne Crigler Primary Care Provider: Aram Beecham Other Clinician: Referring Provider: Aram Beecham Treating Provider/Extender: Linwood Dibbles, HOYT Weeks in Treatment: 18 Diagnosis Coding ICD-10 Codes Code Description S81.811S Laceration without foreign body, right lower leg, sequela L97.812 Non-pressure chronic ulcer of other part of right lower leg with fat layer exposed S81.802A Unspecified open wound, left lower leg, initial encounter L97.822 Non-pressure chronic ulcer of other part of left lower leg with fat layer exposed Facility Procedures CPT4 Code Description: [id number] - DEBRIDE WOUND 1ST 20 SQ CM OR < ICD-10 Diagnosis Description L97.822 Non-pressure chronic ulcer of other part of left lower leg with S81.802A Unspecified open wound, left lower leg, initial encounter Modifier: fat layer expos Quantity: 1 ed Physician Procedures CPT4 Code Description: [id number] - WC PHYS LEVEL 4 - EST PT ICD-10 Diagnosis Description S81.811S Laceration without foreign body, right lower leg, sequela L97.812 Non-pressure chronic ulcer of other part of right lower leg with S81.802A Unspecified  open wound, left lower leg, initial encounter L97.822 Non-pressure chronic ulcer of other part of left lower leg with Modifier: 25 fat layer expo fat layer expos Quantity: 1 sed ed CPT4 Code Description: [id number] - WC PHYS DEBR WO ANESTH 20 SQ CM ICD-10 Diagnosis Description L97.822 Non-pressure  chronic ulcer of other part of left lower leg with S81.802A Unspecified open wound, left lower leg, initial encounter Modifier: fat layer expos Quantity: 1 ed Electronic Signature(s) Signed: 03/26/2018 5:02:11 PM By: Lenda Kelp PA-C Entered By: Lenda Kelp on 03/26/2018 16:25:27

## 2018-03-31 NOTE — Progress Notes (Signed)
MAHLI, GLAHN ([id number]) Visit Report for 03/26/2018 Arrival Information Details Patient Name: [name] Date of Service: 03/26/2018 2:30 PM Medical Record Number: [id number] Patient Account Number: [id number] Date of Birth/Sex: Aug 25, 1930 (84 y.o. F) Treating RN: Curtis Sites Primary Care Corde Antonini: Aram Beecham Other Clinician: Referring Deryck Hippler: Aram Beecham Treating Delanie Tirrell/Extender: Linwood Dibbles, HOYT Weeks in Treatment: 18 Visit Information History Since Last Visit Added or deleted any medications: No Patient Arrived: Walker Any new allergies or adverse reactions: No Arrival Time: 14:57 Had a fall or experienced change in No Accompanied By: self activities of daily living that may affect Transfer Assistance: None risk of falls: Patient Identification Verified: Yes Signs or symptoms of abuse/neglect since last visito No Secondary Verification Process Completed: Yes Hospitalized since last visit: No Patient Requires Transmission-Based Precautions: No Implantable device outside of the clinic excluding No Patient Has Alerts: No cellular tissue based products placed in the center since last visit: Has Dressing in Place as Prescribed: Yes Pain Present Now: No Electronic Signature(s) Signed: 03/26/2018 4:05:33 PM By: Curtis Sites Entered By: Curtis Sites on 03/26/2018 14:57:29 [name] ([id number]) -------------------------------------------------------------------------------- Encounter Discharge Information Details Patient Name: [name] Date of Service: 03/26/2018 2:30 PM Medical Record Number: [id number] Patient Account Number: [id number] Date of Birth/Sex: 10/01/1930 (84 y.o. F) Treating RN: Curtis Sites Primary Care Franco Duley: Aram Beecham Other Clinician: Referring Koah Chisenhall: Aram Beecham Treating Avaleen Brownley/Extender: Linwood Dibbles, HOYT Weeks in Treatment: 78 Encounter Discharge Information Items Discharge Pain Level: 0 Discharge  Condition: Stable Ambulatory Status: Cane Discharge Destination: Home Transportation: Private Auto Schedule Follow-up Appointment: Yes Medication Reconciliation completed and No provided to Patient/Care Juniper Snyders: Patient Clinical Summary of Care: Declined Electronic Signature(s) Signed: 03/27/2018 3:03:21 PM By: Gwenlyn Perking Entered By: Gwenlyn Perking on 03/26/2018 15:43:31 [name] ([id number]) -------------------------------------------------------------------------------- Lower Extremity Assessment Details Patient Name: [name] Date of Service: 03/26/2018 2:30 PM Medical Record Number: [id number] Patient Account Number: [id number] Date of Birth/Sex: [birth year] (84 y.o. F) Treating RN: Curtis Sites Primary Care Takashi Korol: Aram Beecham Other Clinician: Referring Jessia Kief: Aram Beecham Treating Kazim Corrales/Extender: Linwood Dibbles, HOYT Weeks in Treatment: 33 Vascular Assessment Pulses: Dorsalis Pedis Palpable: [Left:Yes] [Right:Yes] Posterior Tibial Extremity colors, hair growth, and conditions: Extremity Color: [Left:Hyperpigmented] [Right:Hyperpigmented] Hair Growth on Extremity: [Left:No] [Right:No] Temperature of Extremity: [Left:Warm] [Right:Warm] Capillary Refill: [Left:< 3 seconds] [Right:< 3 seconds] Electronic Signature(s) Signed: 03/26/2018 4:05:33 PM By: Curtis Sites Entered By: Curtis Sites on 03/26/2018 15:03:45 [name] ([id number]) -------------------------------------------------------------------------------- Multi Wound Chart Details Patient Name: [name] Date of Service: 03/26/2018 2:30 PM Medical Record Number: [id number] Patient Account Number: [id number] Date of Birth/Sex: [birth year] (84 y.o. F) Treating RN: Curtis Sites Primary Care Tavon Magnussen: Aram Beecham Other Clinician: Referring Porschea Borys: Aram Beecham Treating Jannell Franta/Extender: Linwood Dibbles, HOYT Weeks in Treatment: 18 Vital Signs Height(in):  60 Pulse(bpm): 78 Weight(lbs): 123 Blood Pressure(mmHg): 133/47 Body Mass Index(BMI): 24 Temperature(F): 98.2 Respiratory Rate 16 (breaths/min): Photos: Wound Location: Right Lower Leg - Anterior Left Lower Leg - Proximal Left Lower Leg - Distal Wounding Event: Trauma Trauma Trauma Primary Etiology: Trauma, Other Trauma, Other Trauma, Other Comorbid History: Hypertension, Gout, Hypertension, Gout, Hypertension, Gout, Osteoarthritis, Neuropathy Osteoarthritis, Neuropathy Osteoarthritis, Neuropathy Date Acquired: 09/05/2017 03/22/2018 03/22/2018 Weeks of Treatment: 18 0 0 Wound Status: Open Open Open Measurements L x W x D 0.5x0.5x0.1 1.6x0.5x0.2 1x1.6x0.1 (cm) Area (cm) : 0.196 0.628 1.257 Volume (cm) : 0.02 0.126 0.126 % Reduction in Area: 85.10% N/A N/A % Reduction in Volume: 84.80% N/A N/A Classification: Full Thickness With Exposed  Full Thickness Without Partial Thickness Support Structures Exposed Support Structures Exudate Amount: Large Large Medium Exudate Type: Serosanguineous Serous Serous Exudate Color: red, brown amber amber Wound Margin: Distinct, outline attached Flat and Intact Flat and Intact Granulation Amount: Medium (34-66%) Small (1-33%) Medium (34-66%) Granulation Quality: Red Red Red Necrotic Amount: Medium (34-66%) Large (67-100%) Medium (34-66%) Necrotic Tissue: Adherent Slough Eschar, Adherent Slough Adherent [employer] Exposed Structures: Fat Layer (Subcutaneous Fat Layer (Subcutaneous Fascia: No Tissue) Exposed: Yes Tissue) Exposed: Yes Fat Layer (Subcutaneous Fascia: No Fascia: No Tissue) Exposed: No Tendon: No Tendon: No Tendon: No Muscle: No Muscle: No Muscle: No [name], [name] ([id number]) Joint: No Joint: No Joint: No Bone: No Bone: No Bone: No Epithelialization: Small (1-33%) None None Periwound Skin Texture: Induration: Yes Excoriation: No Excoriation: No Scarring: Yes Induration: No Induration: No Excoriation: No Callus:  No Callus: No Callus: No Crepitus: No Crepitus: No Crepitus: No Rash: No Rash: No Rash: No Scarring: No Scarring: No Periwound Skin Moisture: Maceration: No Maceration: No Maceration: No Dry/Scaly: No Dry/Scaly: No Dry/Scaly: No Periwound Skin Color: Atrophie Blanche: No Atrophie Blanche: No Atrophie Blanche: No Cyanosis: No Cyanosis: No Cyanosis: No Ecchymosis: No Ecchymosis: No Ecchymosis: No Erythema: No Erythema: No Erythema: No Hemosiderin Staining: No Hemosiderin Staining: No Hemosiderin Staining: No Mottled: No Mottled: No Mottled: No Pallor: No Pallor: No Pallor: No Rubor: No Rubor: No Rubor: No Temperature: No Abnormality No Abnormality No Abnormality Tenderness on Palpation: Yes Yes No Wound Preparation: Ulcer Cleansing: Ulcer Cleansing: Ulcer Cleansing: Rinsed/Irrigated with Saline Rinsed/Irrigated with Saline Rinsed/Irrigated with Saline Topical Anesthetic Applied: Topical Anesthetic Applied: Topical Anesthetic Applied: Other: lidocaine 4% Other: lidocaine 4% Other: lidocaine 4% Treatment Notes Electronic Signature(s) Signed: 03/26/2018 4:05:33 PM By: Curtis Sites Entered By: Curtis Sites on 03/26/2018 15:24:55 [name] ([id number]) -------------------------------------------------------------------------------- Multi-Disciplinary Care Plan Details Patient Name: [name] Date of Service: 03/26/2018 2:30 PM Medical Record Number: [id number] Patient Account Number: [id number] Date of Birth/Sex: 08-16-1930 (84 y.o. F) Treating RN: Curtis Sites Primary Care Lawerance Matsuo: Aram Beecham Other Clinician: Referring Undrea Archbold: Aram Beecham Treating Wilmore Holsomback/Extender: Linwood Dibbles, HOYT Weeks in Treatment: 53 Active Inactive ` Orientation to the Wound Care Program Nursing Diagnoses: Knowledge deficit related to the wound healing center program Goals: Patient/caregiver will verbalize understanding of the Wound Healing Center  Program Date Initiated: 11/14/2017 Target Resolution Date: 11/28/2017 Goal Status: Active Interventions: Provide education on orientation to the wound center Notes: ` Wound/Skin Impairment Nursing Diagnoses: Impaired tissue integrity Knowledge deficit related to ulceration/compromised skin integrity Goals: Patient/caregiver will verbalize understanding of skin care regimen Date Initiated: 11/14/2017 Target Resolution Date: 11/28/2017 Goal Status: Active Ulcer/skin breakdown will have a volume reduction of 30% by week 4 Date Initiated: 11/14/2017 Target Resolution Date: 11/28/2017 Goal Status: Active Interventions: Assess patient/caregiver ability to obtain necessary supplies Assess patient/caregiver ability to perform ulcer/skin care regimen upon admission and as needed Assess ulceration(s) every visit Treatment Activities: Skin care regimen initiated : 11/14/2017 Notes: Electronic Signature(s) Signed: 03/26/2018 4:05:33 PM By: Estrella Myrtle, Lawernce Keas ([id number]) Entered By: Curtis Sites on 03/26/2018 15:24:44 [name] ([id number]) -------------------------------------------------------------------------------- Pain Assessment Details Patient Name: [name] Date of Service: 03/26/2018 2:30 PM Medical Record Number: [id number] Patient Account Number: [id number] Date of Birth/Sex: [birth year] (84 y.o. F) Treating RN: Curtis Sites Primary Care Tymere Depuy: Aram Beecham Other Clinician: Referring Jillisa Harris: Aram Beecham Treating Ermalee Mealy/Extender: Linwood Dibbles, HOYT Weeks in Treatment: 33 Active Problems Location of Pain Severity and Description of Pain Patient Has Paino No Site Locations Pain  Management and Medication Current Pain Management: Electronic Signature(s) Signed: 03/26/2018 4:05:33 PM By: Curtis Sites Entered By: Curtis Sites on 03/26/2018 14:57:35 [name]  ([id number]) -------------------------------------------------------------------------------- Patient/Caregiver Education Details Patient Name: [name] Date of Service: 03/26/2018 2:30 PM Medical Record Number: [id number] Patient Account Number: [id number] Date of Birth/Gender: Aug 02, 1930 (84 y.o. F) Treating RN: Curtis Sites Primary Care Physician: Aram Beecham Other Clinician: Referring Physician: Aram Beecham Treating Physician/Extender: Skeet Simmer in Treatment: 71 Education Assessment Education Provided To: Patient Education Topics Provided Wound Debridement: Handouts: Wound Debridement Methods: Explain/Verbal Responses: State content correctly Wound/Skin Impairment: Handouts: Caring for Your Ulcer Methods: Explain/Verbal Responses: State content correctly Electronic Signature(s) Signed: 03/26/2018 4:05:33 PM By: Curtis Sites Entered By: Curtis Sites on 03/26/2018 15:41:19 [name] ([id number]) -------------------------------------------------------------------------------- Wound Assessment Details Patient Name: [name] Date of Service: 03/26/2018 2:30 PM Medical Record Number: [id number] Patient Account Number: [id number] Date of Birth/Sex: 24-Jul-1930 (84 y.o. F) Treating RN: Curtis Sites Primary Care Daxtyn Rottenberg: Aram Beecham Other Clinician: Referring Lakeya Mulka: Aram Beecham Treating Aryon Nham/Extender: Linwood Dibbles, HOYT Weeks in Treatment: 18 Wound Status Wound Number: 5 Primary Etiology: Trauma, Other Wound Location: Right Lower Leg - Anterior Wound Status: Open Wounding Event: Trauma Comorbid Hypertension, Gout, Osteoarthritis, History: Neuropathy Date Acquired: 09/05/2017 Weeks Of Treatment: 18 Clustered Wound: No Photos Photo Uploaded By: Curtis Sites on 03/26/2018 15:13:20 Wound Measurements Length: (cm) 0.5 Width: (cm) 0.5 Depth: (cm) 0.1 Area: (cm) 0.196 Volume: (cm) 0.02 % Reduction in Area:  85.1% % Reduction in Volume: 84.8% Epithelialization: Small (1-33%) Tunneling: No Undermining: No Wound Description Full Thickness With Exposed Support Foul Classification: Structures Sloug Wound Margin: Distinct, outline attached Exudate Large Amount: Exudate Type: Serosanguineous Exudate Color: red, brown Odor After Cleansing: No h/Fibrino Yes Wound Bed Granulation Amount: Medium (34-66%) Exposed Structure Granulation Quality: Red Fascia Exposed: No Necrotic Amount: Medium (34-66%) Fat Layer (Subcutaneous Tissue) Exposed: Yes Necrotic Quality: Adherent Slough Tendon Exposed: No Muscle Exposed: No Joint Exposed: No Bone Exposed: No WANDRA, KAZEMI V. ([id number]) Periwound Skin Texture Texture Color No Abnormalities Noted: No No Abnormalities Noted: No Callus: No Atrophie Blanche: No Crepitus: No Cyanosis: No Excoriation: No Ecchymosis: No Induration: Yes Erythema: No Rash: No Hemosiderin Staining: No Scarring: Yes Mottled: No Pallor: No Moisture Rubor: No No Abnormalities Noted: No Dry / Scaly: No Temperature / Pain Maceration: No Temperature: No Abnormality Tenderness on Palpation: Yes Wound Preparation Ulcer Cleansing: Rinsed/Irrigated with Saline Topical Anesthetic Applied: Other: lidocaine 4%, Treatment Notes Wound #5 (Right, Anterior Lower Leg) 1. Cleansed with: Clean wound with Normal Saline 2. Anesthetic Topical Lidocaine 4% cream to wound bed prior to debridement 4. Dressing Applied: Other dressing (specify in notes) 5. Secondary Dressing Applied Bordered Foam Dressing Notes endoform Electronic Signature(s) Signed: 03/26/2018 4:05:33 PM By: Curtis Sites Entered By: Curtis Sites on 03/26/2018 15:05:03 [name] ([id number]) -------------------------------------------------------------------------------- Wound Assessment Details Patient Name: [name] Date of Service: 03/26/2018 2:30 PM Medical Record Number:  [id number] Patient Account Number: [id number] Date of Birth/Sex: 13-Aug-1930 (84 y.o. F) Treating RN: Curtis Sites Primary Care Ranvir Renovato: Aram Beecham Other Clinician: Referring Kiaira Pointer: Aram Beecham Treating Jennfer Gassen/Extender: Linwood Dibbles, HOYT Weeks in Treatment: 18 Wound Status Wound Number: 7 Primary Etiology: Trauma, Other Wound Location: Left Lower Leg - Proximal Wound Status: Open Wounding Event: Trauma Comorbid Hypertension, Gout, Osteoarthritis, History: Neuropathy Date Acquired: 03/22/2018 Weeks Of Treatment: 0 Clustered Wound: No Photos Photo Uploaded By: Curtis Sites on 03/26/2018 15:13:21 Wound Measurements Length: (cm) 1.6 Width: (cm) 0.5 Depth: (cm)  0.2 Area: (cm) 0.628 Volume: (cm) 0.126 % Reduction in Area: % Reduction in Volume: Epithelialization: None Tunneling: No Undermining: No Wound Description Full Thickness Without Exposed Support Foul Classification: Structures Sloug Wound Margin: Flat and Intact Exudate Large Amount: Exudate Type: Serous Exudate Color: amber Odor After Cleansing: No h/Fibrino Yes Wound Bed Granulation Amount: Small (1-33%) Exposed Structure Granulation Quality: Red Fascia Exposed: No Necrotic Amount: Large (67-100%) Fat Layer (Subcutaneous Tissue) Exposed: Yes Necrotic Quality: Eschar, Adherent Slough Tendon Exposed: No Muscle Exposed: No Joint Exposed: No Bone Exposed: No JANAYE, COR[employer] V. ([id number]) Periwound Skin Texture Texture Color No Abnormalities Noted: No No Abnormalities Noted: No Callus: No Atrophie Blanche: No Crepitus: No Cyanosis: No Excoriation: No Ecchymosis: No Induration: No Erythema: No Rash: No Hemosiderin Staining: No Scarring: No Mottled: No Pallor: No Moisture Rubor: No No Abnormalities Noted: No Dry / Scaly: No Temperature / Pain Maceration: No Temperature: No Abnormality Tenderness on Palpation: Yes Wound Preparation Ulcer Cleansing: Rinsed/Irrigated with  Saline Topical Anesthetic Applied: Other: lidocaine 4%, Treatment Notes Wound #7 (Left, Proximal Lower Leg) 1. Cleansed with: Clean wound with Normal Saline 2. Anesthetic Topical Lidocaine 4% cream to wound bed prior to debridement 4. Dressing Applied: Xeroform 5. Secondary Dressing Applied Bordered Foam Dressing Electronic Signature(s) Signed: 03/26/2018 4:05:33 PM By: Curtis Sites Entered By: Curtis Sites on 03/26/2018 15:07:14 [name] ([id number]) -------------------------------------------------------------------------------- Wound Assessment Details Patient Name: [name] Date of Service: 03/26/2018 2:30 PM Medical Record Number: [id number] Patient Account Number: [id number] Date of Birth/Sex: [birth year] (84 y.o. F) Treating RN: Curtis Sites Primary Care Kenishia Plack: Aram Beecham Other Clinician: Referring Annalina Needles: Aram Beecham Treating Jennfier Abdulla/Extender: Linwood Dibbles, HOYT Weeks in Treatment: 18 Wound Status Wound Number: 8 Primary Etiology: Trauma, Other Wound Location: Left Lower Leg - Distal Wound Status: Open Wounding Event: Trauma Comorbid Hypertension, Gout, Osteoarthritis, History: Neuropathy Date Acquired: 03/22/2018 Weeks Of Treatment: 0 Clustered Wound: No Photos Photo Uploaded By: Curtis Sites on 03/26/2018 15:13:49 Wound Measurements Length: (cm) 1 Width: (cm) 1.6 Depth: (cm) 0.1 Area: (cm) 1.257 Volume: (cm) 0.126 % Reduction in Area: % Reduction in Volume: Epithelialization: None Tunneling: No Undermining: No Wound Description Classification: Partial Thickness Foul Wound Margin: Flat and Intact Sloug Exudate Amount: Medium Exudate Type: Serous Exudate Color: amber Odor After Cleansing: No h/Fibrino No Wound Bed Granulation Amount: Medium (34-66%) Exposed Structure Granulation Quality: Red Fascia Exposed: No Necrotic Amount: Medium (34-66%) Fat Layer (Subcutaneous Tissue) Exposed: No Necrotic Quality: Adherent  Slough Tendon Exposed: No Muscle Exposed: No Joint Exposed: No Bone Exposed: No Periwound Skin Texture ZAMYA, CULHANE V. ([id number]) Texture Color No Abnormalities Noted: No No Abnormalities Noted: No Callus: No Atrophie Blanche: No Crepitus: No Cyanosis: No Excoriation: No Ecchymosis: No Induration: No Erythema: No Rash: No Hemosiderin Staining: No Scarring: No Mottled: No Pallor: No Moisture Rubor: No No Abnormalities Noted: No Dry / Scaly: No Temperature / Pain Maceration: No Temperature: No Abnormality Wound Preparation Ulcer Cleansing: Rinsed/Irrigated with Saline Topical Anesthetic Applied: Other: lidocaine 4%, Treatment Notes Wound #8 (Left, Distal Lower Leg) 1. Cleansed with: Clean wound with Normal Saline 2. Anesthetic Topical Lidocaine 4% cream to wound bed prior to debridement 4. Dressing Applied: Xeroform 5. Secondary Dressing Applied Bordered Foam Dressing Electronic Signature(s) Signed: 03/26/2018 4:05:33 PM By: Curtis Sites Entered By: Curtis Sites on 03/26/2018 15:08:24 [name] ([id number]) -------------------------------------------------------------------------------- Vitals Details Patient Name: [name] Date of Service: 03/26/2018 2:30 PM Medical Record Number: [id number] Patient Account Number: [id number] Date of Birth/Sex: September 28, 1930 (84 y.o. F) Treating  RN: Curtis Sites Primary Care Amelio Brosky: Aram Beecham Other Clinician: Referring Anina Schnake: Aram Beecham Treating Liban Guedes/Extender: Linwood Dibbles, HOYT Weeks in Treatment: 18 Vital Signs Time Taken: 14:57 Temperature (F): 98.2 Height (in): 60 Pulse (bpm): 78 Weight (lbs): 123 Respiratory Rate (breaths/min): 16 Body Mass Index (BMI): 24 Blood Pressure (mmHg): 133/47 Reference Range: 80 - 120 mg / dl Electronic Signature(s) Signed: 03/26/2018 4:05:33 PM By: Curtis Sites Entered By: Curtis Sites on 03/26/2018 14:59:51

## 2018-04-01 LAB — AEROBIC/ANAEROBIC CULTURE (SURGICAL/DEEP WOUND)

## 2018-04-01 LAB — AEROBIC/ANAEROBIC CULTURE W GRAM STAIN (SURGICAL/DEEP WOUND)

## 2018-04-03 ENCOUNTER — Encounter: Payer: Medicare Other | Admitting: Physical Therapy

## 2018-04-03 ENCOUNTER — Ambulatory Visit
Admission: RE | Admit: 2018-04-03 | Discharge: 2018-04-03 | Disposition: A | Discharge status: Home / Self Care | Payer: Medicare Other | Source: Ambulatory Visit | Attending: Neurology | Admitting: Neurology

## 2018-04-03 ENCOUNTER — Encounter: Payer: Medicare Other | Admitting: Physician Assistant

## 2018-04-03 DIAGNOSIS — G9389 Other specified disorders of brain: Secondary | ICD-10-CM | POA: Insufficient documentation

## 2018-04-03 DIAGNOSIS — R269 Unspecified abnormalities of gait and mobility: Secondary | ICD-10-CM | POA: Diagnosis present

## 2018-04-03 DIAGNOSIS — L97812 Non-pressure chronic ulcer of other part of right lower leg with fat layer exposed: Secondary | ICD-10-CM | POA: Diagnosis not present

## 2018-04-09 ENCOUNTER — Encounter: Payer: Medicare Other | Admitting: Physician Assistant

## 2018-04-09 DIAGNOSIS — L97812 Non-pressure chronic ulcer of other part of right lower leg with fat layer exposed: Secondary | ICD-10-CM | POA: Diagnosis not present

## 2018-04-10 ENCOUNTER — Ambulatory Visit: Payer: Medicare Other | Admitting: Physician Assistant

## 2018-04-10 NOTE — Progress Notes (Signed)
[name], [name] ([id number]) Visit Report for 04/03/2018 Arrival Information Details Patient Name: [name], [name]. Date of Service: 04/03/2018 2:30 PM Medical Record Number: [id number] Patient Account Number: [id number] Date of Birth/Sex: [birth year] (84 y.o. F) Treating RN: Curtis Sites Primary Care Mahdiya Mossberg: Aram Beecham Other Clinician: Referring Teresia Myint: Aram Beecham Treating Bodhi Stenglein/Extender: Linwood Dibbles, HOYT Weeks in Treatment: 20 Visit Information History Since Last Visit Added or deleted any medications: No Patient Arrived: Walker Any new allergies or adverse reactions: No Arrival Time: 14:59 Had a fall or experienced change in No Accompanied By: self activities of daily living that may affect Transfer Assistance: None risk of falls: Patient Identification Verified: Yes Signs or symptoms of abuse/neglect since last visito No Secondary Verification Process Completed: Yes Hospitalized since last visit: No Patient Requires Transmission-Based Precautions: No Implantable device outside of the clinic excluding No Patient Has Alerts: No cellular tissue based products placed in the center since last visit: Has Dressing in Place as Prescribed: Yes Pain Present Now: No Electronic Signature(s) Signed: 04/03/2018 4:18:46 PM By: Curtis Sites Entered By: Curtis Sites on 04/03/2018 14:59:20 [name] ([id number]) -------------------------------------------------------------------------------- Encounter Discharge Information Details Patient Name: [name] Date of Service: 04/03/2018 2:30 PM Medical Record Number: [id number] Patient Account Number: [id number] Date of Birth/Sex: 1930/07/24 (84 y.o. F) Treating RN: Phillis Haggis Primary Care Lezette Kitts: Aram Beecham Other Clinician: Referring Breawna Montenegro: Aram Beecham Treating Sorin Frimpong/Extender: Linwood Dibbles, HOYT Weeks in Treatment: 20 Encounter Discharge Information Items Discharge Pain Level: 0 Discharge  Condition: Stable Ambulatory Status: Walker Discharge Destination: Home Transportation: Private Auto Accompanied By: caregiver Schedule Follow-up Appointment: Yes Medication Reconciliation completed and No provided to Patient/Care Ashari Llewellyn: Patient Clinical Summary of Care: Declined Electronic Signature(s) Signed: 04/03/2018 3:38:41 PM By: Alejandro Mulling Entered By: Alejandro Mulling on 04/03/2018 15:38:40 [name] ([id number]) -------------------------------------------------------------------------------- Lower Extremity Assessment Details Patient Name: [name] Date of Service: 04/03/2018 2:30 PM Medical Record Number: [id number] Patient Account Number: [id number] Date of Birth/Sex: 19-Jan-1930 (84 y.o. F) Treating RN: Curtis Sites Primary Care Briant Angelillo: Aram Beecham Other Clinician: Referring Zyann Mabry: Aram Beecham Treating Quina Wilbourne/Extender: Linwood Dibbles, HOYT Weeks in Treatment: 20 Vascular Assessment Pulses: Dorsalis Pedis Palpable: [Left:Yes] [Right:Yes] Posterior Tibial Extremity colors, hair growth, and conditions: Extremity Color: [Left:Hyperpigmented] [Right:Hyperpigmented] Hair Growth on Extremity: [Left:No] [Right:No] Temperature of Extremity: [Left:Warm] [Right:Warm] Capillary Refill: [Left:< 3 seconds] [Right:< 3 seconds] Electronic Signature(s) Signed: 04/03/2018 4:18:46 PM By: Curtis Sites Entered By: Curtis Sites on 04/03/2018 15:06:48 [name] ([id number]) -------------------------------------------------------------------------------- Multi Wound Chart Details Patient Name: [name] Date of Service: 04/03/2018 2:30 PM Medical Record Number: [id number] Patient Account Number: [id number] Date of Birth/Sex: [birth year] (84 y.o. F) Treating RN: Phillis Haggis Primary Care Anoushka Divito: Aram Beecham Other Clinician: Referring Kendal Ghazarian: Aram Beecham Treating Lilymarie Scroggins/Extender: Linwood Dibbles, HOYT Weeks in Treatment:  20 Vital Signs Height(in): 60 Pulse(bpm): 71 Weight(lbs): 123 Blood Pressure(mmHg): 119/45 Body Mass Index(BMI): 24 Temperature(F): 97.9 Respiratory Rate 18 (breaths/min): Photos: [5:No Photos] [7:No Photos] [8:No Photos] Wound Location: [5:Right Lower Leg - Anterior] [7:Left Lower Leg - Proximal] [8:Left Lower Leg - Distal] Wounding Event: [5:Trauma] [7:Trauma] [8:Trauma] Primary Etiology: [5:Trauma, Other] [7:Trauma, Other] [8:Trauma, Other] Comorbid History: [5:Hypertension, Gout, Osteoarthritis, Neuropathy] [7:Hypertension, Gout, Osteoarthritis, Neuropathy] [8:Hypertension, Gout, Osteoarthritis, Neuropathy] Date Acquired: [5:09/05/2017] [7:03/22/2018] [8:03/22/2018] Weeks of Treatment: [5:20] [7:1] [8:1] Wound Status: [5:Open] [7:Open] [8:Open] Measurements L x W x D [5:0.3x0.2x0.1] [7:1.6x0.5x0.2] [8:0.7x1.2x0.1] (cm) Area (cm) : [5:0.047] [7:0.628] [8:0.66] Volume (cm) : [5:0.005] [7:0.126] [8:0.066] % Reduction in Area: [5:96.40%] [7:0.00%] [8:47.50%] % Reduction in  Volume: [5:96.20%] [7:0.00%] [8:47.60%] Classification: [5:Full Thickness With Exposed Support Structures] [7:Full Thickness Without Exposed Support Structures] [8:Partial Thickness] Exudate Amount: [5:Large] [7:Large] [8:Medium] Exudate Type: [5:Serosanguineous] [7:Serous] [8:Serous] Exudate Color: [5:red, brown] [7:amber] [8:amber] Wound Margin: [5:Distinct, outline attached] [7:Flat and Intact] [8:Flat and Intact] Granulation Amount: [5:Large (67-100%)] [7:Small (1-33%)] [8:Medium (34-66%)] Granulation Quality: [5:Red] [7:Red] [8:Red] Necrotic Amount: [5:Small (1-33%)] [7:Large (67-100%)] [8:Medium (34-66%)] Necrotic Tissue: [5:Adherent Slough] [7:Eschar, Adherent Slough] [8:Adherent Slough] Exposed Structures: [5:Fat Layer (Subcutaneous Tissue) Exposed: Yes Fascia: No Tendon: No Muscle: No Joint: No Bone: No] [7:Fat Layer (Subcutaneous Tissue) Exposed: Yes Fascia: No Tendon: No Muscle: No Joint: No Bone:  No] [8:Fascia: No Fat Layer (Subcutaneous Tissue)  Exposed: No Tendon: No Muscle: No Joint: No Bone: No] Epithelialization: [5:Small (1-33%)] [7:None] [8:None] Periwound Skin Texture: [5:Induration: Yes Scarring: Yes Excoriation: No Callus: No] [7:Excoriation: No Induration: No Callus: No Crepitus: No] [8:Excoriation: No Induration: No Callus: No Crepitus: No] Crepitus: No Rash: No Rash: No Rash: No Scarring: No Scarring: No Periwound Skin Moisture: Maceration: No Maceration: No Maceration: No Dry/Scaly: No Dry/Scaly: No Dry/Scaly: No Periwound Skin Color: Atrophie Blanche: No Atrophie Blanche: No Atrophie Blanche: No Cyanosis: No Cyanosis: No Cyanosis: No Ecchymosis: No Ecchymosis: No Ecchymosis: No Erythema: No Erythema: No Erythema: No Hemosiderin Staining: No Hemosiderin Staining: No Hemosiderin Staining: No Mottled: No Mottled: No Mottled: No Pallor: No Pallor: No Pallor: No Rubor: No Rubor: No Rubor: No Temperature: No Abnormality No Abnormality No Abnormality Tenderness on Palpation: Yes Yes No Wound Preparation: Ulcer Cleansing: Ulcer Cleansing: Ulcer Cleansing: Rinsed/Irrigated with Saline Rinsed/Irrigated with Saline Rinsed/Irrigated with Saline Topical Anesthetic Applied: Topical Anesthetic Applied: Topical Anesthetic Applied: Other: lidocaine 4% Other: lidocaine 4% Other: lidocaine 4% Treatment Notes Electronic Signature(s) Signed: 04/06/2018 4:29:16 PM By: Alejandro Mulling Entered By: Alejandro Mulling on 04/03/2018 15:12:00 [name] ([id number]) -------------------------------------------------------------------------------- Multi-Disciplinary Care Plan Details Patient Name: [name] Date of Service: 04/03/2018 2:30 PM Medical Record Number: [id number] Patient Account Number: [id number] Date of Birth/Sex: [birth year] (84 y.o. F) Treating RN: Phillis Haggis Primary Care Abbigayle Toole: Aram Beecham Other Clinician: Referring  Gualberto Wahlen: Aram Beecham Treating Natane Heward/Extender: Linwood Dibbles, HOYT Weeks in Treatment: 20 Active Inactive ` Orientation to the Wound Care Program Nursing Diagnoses: Knowledge deficit related to the wound healing center program Goals: Patient/caregiver will verbalize understanding of the Wound Healing Center Program Date Initiated: 11/14/2017 Target Resolution Date: 11/28/2017 Goal Status: Active Interventions: Provide education on orientation to the wound center Notes: ` Wound/Skin Impairment Nursing Diagnoses: Impaired tissue integrity Knowledge deficit related to ulceration/compromised skin integrity Goals: Patient/caregiver will verbalize understanding of skin care regimen Date Initiated: 11/14/2017 Target Resolution Date: 11/28/2017 Goal Status: Active Ulcer/skin breakdown will have a volume reduction of 30% by week 4 Date Initiated: 11/14/2017 Target Resolution Date: 11/28/2017 Goal Status: Active Interventions: Assess patient/caregiver ability to obtain necessary supplies Assess patient/caregiver ability to perform ulcer/skin care regimen upon admission and as needed Assess ulceration(s) every visit Treatment Activities: Skin care regimen initiated : 11/14/2017 Notes: Electronic Signature(s) Signed: 04/06/2018 4:29:16 PM By: Vira Agar, Lawernce Keas ([id number]) Entered By: Alejandro Mulling on 04/03/2018 15:11:50 [name] ([id number]) -------------------------------------------------------------------------------- Pain Assessment Details Patient Name: [name] Date of Service: 04/03/2018 2:30 PM Medical Record Number: [id number] Patient Account Number: [id number] Date of Birth/Sex: Apr 06, 1930 (84 y.o. F) Treating RN: Curtis Sites Primary Care Emilyann Banka: Aram Beecham Other Clinician: Referring Kaden Daughdrill: Aram Beecham Treating Britany Callicott/Extender: Linwood Dibbles, HOYT Weeks in Treatment: 20 Active Problems Location of Pain Severity and  Description of Pain  Patient Has Paino No Site Locations Pain Management and Medication Current Pain Management: Electronic Signature(s) Signed: 04/03/2018 4:18:46 PM By: Curtis Sites Entered By: Curtis Sites on 04/03/2018 15:00:30 [name] ([id number]) -------------------------------------------------------------------------------- Patient/Caregiver Education Details Patient Name: [name] Date of Service: 04/03/2018 2:30 PM Medical Record Number: [id number] Patient Account Number: [id number] Date of Birth/Gender: [birth year] (84 y.o. F) Treating RN: Phillis Haggis Primary Care Physician: Aram Beecham Other Clinician: Referring Physician: Aram Beecham Treating Physician/Extender: Skeet Simmer in Treatment: 20 Education Assessment Education Provided To: Patient Education Topics Provided Wound/Skin Impairment: Handouts: Caring for Your Ulcer, Other: change dressing as ordered Methods: Demonstration, Explain/Verbal Responses: State content correctly Electronic Signature(s) Signed: 04/06/2018 4:29:16 PM By: Alejandro Mulling Entered By: Alejandro Mulling on 04/03/2018 15:38:58 [name] ([id number]) -------------------------------------------------------------------------------- Wound Assessment Details Patient Name: [name] Date of Service: 04/03/2018 2:30 PM Medical Record Number: [id number] Patient Account Number: [id number] Date of Birth/Sex: 12/27/1929 (84 y.o. F) Treating RN: Curtis Sites Primary Care Everlynn Sagun: Aram Beecham Other Clinician: Referring Teira Arcilla: Aram Beecham Treating Santo Zahradnik/Extender: Linwood Dibbles, HOYT Weeks in Treatment: 20 Wound Status Wound Number: 5 Primary Etiology: Trauma, Other Wound Location: Right Lower Leg - Anterior Wound Status: Open Wounding Event: Trauma Comorbid Hypertension, Gout, Osteoarthritis, History: Neuropathy Date Acquired: 09/05/2017 Weeks Of Treatment: 20 Clustered Wound:  No Photos Photo Uploaded By: Curtis Sites on 04/03/2018 16:15:01 Wound Measurements Length: (cm) 0.3 Width: (cm) 0.2 Depth: (cm) 0.1 Area: (cm) 0.047 Volume: (cm) 0.005 % Reduction in Area: 96.4% % Reduction in Volume: 96.2% Epithelialization: Small (1-33%) Tunneling: No Undermining: No Wound Description Full Thickness With Exposed Support Foul Classification: Structures Sloug Wound Margin: Distinct, outline attached Exudate Large Amount: Exudate Type: Serosanguineous Exudate Color: red, brown Odor After Cleansing: No h/Fibrino Yes Wound Bed Granulation Amount: Large (67-100%) Exposed Structure Granulation Quality: Red Fascia Exposed: No Necrotic Amount: Small (1-33%) Fat Layer (Subcutaneous Tissue) Exposed: Yes Necrotic Quality: Adherent Slough Tendon Exposed: No Muscle Exposed: No Joint Exposed: No Bone Exposed: No [name], [name] ([id number]) Periwound Skin Texture Texture Color No Abnormalities Noted: No No Abnormalities Noted: No Callus: No Atrophie Blanche: No Crepitus: No Cyanosis: No Excoriation: No Ecchymosis: No Induration: Yes Erythema: No Rash: No Hemosiderin Staining: No Scarring: Yes Mottled: No Pallor: No Moisture Rubor: No No Abnormalities Noted: No Dry / Scaly: No Temperature / Pain Maceration: No Temperature: No Abnormality Tenderness on Palpation: Yes Wound Preparation Ulcer Cleansing: Rinsed/Irrigated with Saline Topical Anesthetic Applied: Other: lidocaine 4%, Electronic Signature(s) Signed: 04/03/2018 4:18:46 PM By: Curtis Sites Entered By: Curtis Sites on 04/03/2018 15:05:59 [name] ([id number]) -------------------------------------------------------------------------------- Wound Assessment Details Patient Name: [name] Date of Service: 04/03/2018 2:30 PM Medical Record Number: [id number] Patient Account Number: [id number] Date of Birth/Sex: 1930/01/03 (84 y.o. F) Treating RN: Curtis Sites Primary Care Karolyn Messing: Aram Beecham Other Clinician: Referring Tiani Stanbery: Aram Beecham Treating Traves Majchrzak/Extender: Linwood Dibbles, HOYT Weeks in Treatment: 20 Wound Status Wound Number: 7 Primary Etiology: Trauma, Other Wound Location: Left Lower Leg - Proximal Wound Status: Open Wounding Event: Trauma Comorbid Hypertension, Gout, Osteoarthritis, History: Neuropathy Date Acquired: 03/22/2018 Weeks Of Treatment: 1 Clustered Wound: No Photos Photo Uploaded By: Curtis Sites on 04/03/2018 16:15:13 Wound Measurements Length: (cm) 1.6 Width: (cm) 0.5 Depth: (cm) 0.2 Area: (cm) 0.628 Volume: (cm) 0.126 % Reduction in Area: 0% % Reduction in Volume: 0% Epithelialization: None Tunneling: No Undermining: No Wound Description Full Thickness Without Exposed Support Foul Classification: Structures Sloug Wound Margin: Flat and Intact Exudate Large Amount: Exudate Type:  Serous Exudate Color: amber Odor After Cleansing: No h/Fibrino Yes Wound Bed Granulation Amount: Small (1-33%) Exposed Structure Granulation Quality: Red Fascia Exposed: No Necrotic Amount: Large (67-100%) Fat Layer (Subcutaneous Tissue) Exposed: Yes Necrotic Quality: Eschar, Adherent Slough Tendon Exposed: No Muscle Exposed: No Joint Exposed: No Bone Exposed: No [name], [name] ([id number]) Periwound Skin Texture Texture Color No Abnormalities Noted: No No Abnormalities Noted: No Callus: No Atrophie Blanche: No Crepitus: No Cyanosis: No Excoriation: No Ecchymosis: No Induration: No Erythema: No Rash: No Hemosiderin Staining: No Scarring: No Mottled: No Pallor: No Moisture Rubor: No No Abnormalities Noted: No Dry / Scaly: No Temperature / Pain Maceration: No Temperature: No Abnormality Tenderness on Palpation: Yes Wound Preparation Ulcer Cleansing: Rinsed/Irrigated with Saline Topical Anesthetic Applied: Other: lidocaine 4%, Electronic Signature(s) Signed: 04/03/2018 4:18:46  PM By: Curtis Sites Entered By: Curtis Sites on 04/03/2018 15:06:14 [name] ([id number]) -------------------------------------------------------------------------------- Wound Assessment Details Patient Name: [name] Date of Service: 04/03/2018 2:30 PM Medical Record Number: [id number] Patient Account Number: [id number] Date of Birth/Sex: [birth year] (84 y.o. F) Treating RN: Curtis Sites Primary Care Layann Bluett: Aram Beecham Other Clinician: Referring Charod Slawinski: Aram Beecham Treating Elfego Giammarino/Extender: Linwood Dibbles, HOYT Weeks in Treatment: 20 Wound Status Wound Number: 8 Primary Etiology: Trauma, Other Wound Location: Left Lower Leg - Distal Wound Status: Open Wounding Event: Trauma Comorbid Hypertension, Gout, Osteoarthritis, History: Neuropathy Date Acquired: 03/22/2018 Weeks Of Treatment: 1 Clustered Wound: No Photos Photo Uploaded By: Curtis Sites on 04/03/2018 16:15:25 Wound Measurements Length: (cm) 0.7 Width: (cm) 1.2 Depth: (cm) 0.1 Area: (cm) 0.66 Volume: (cm) 0.066 % Reduction in Area: 47.5% % Reduction in Volume: 47.6% Epithelialization: None Tunneling: No Undermining: No Wound Description Classification: Partial Thickness Foul Wound Margin: Flat and Intact Sloug Exudate Amount: Medium Exudate Type: Serous Exudate Color: amber Odor After Cleansing: No h/Fibrino No Wound Bed Granulation Amount: Medium (34-66%) Exposed Structure Granulation Quality: Red Fascia Exposed: No Necrotic Amount: Medium (34-66%) Fat Layer (Subcutaneous Tissue) Exposed: No Necrotic Quality: Adherent Slough Tendon Exposed: No Muscle Exposed: No Joint Exposed: No Bone Exposed: No Periwound Skin Texture [name], [name] ([id number]) Texture Color No Abnormalities Noted: No No Abnormalities Noted: No Callus: No Atrophie Blanche: No Crepitus: No Cyanosis: No Excoriation: No Ecchymosis: No Induration: No Erythema: No Rash: No Hemosiderin  Staining: No Scarring: No Mottled: No Pallor: No Moisture Rubor: No No Abnormalities Noted: No Dry / Scaly: No Temperature / Pain Maceration: No Temperature: No Abnormality Wound Preparation Ulcer Cleansing: Rinsed/Irrigated with Saline Topical Anesthetic Applied: Other: lidocaine 4%, Electronic Signature(s) Signed: 04/03/2018 4:18:46 PM By: Curtis Sites Entered By: Curtis Sites on 04/03/2018 15:06:29 [name] ([id number]) -------------------------------------------------------------------------------- Vitals Details Patient Name: [name] Date of Service: 04/03/2018 2:30 PM Medical Record Number: [id number] Patient Account Number: [id number] Date of Birth/Sex: 05/21/1930 (84 y.o. F) Treating RN: Curtis Sites Primary Care Pretty Weltman: Aram Beecham Other Clinician: Referring Kriya Westra: Aram Beecham Treating Alwin Lanigan/Extender: Linwood Dibbles, HOYT Weeks in Treatment: 20 Vital Signs Time Taken: 15:00 Temperature (F): 97.9 Height (in): 60 Pulse (bpm): 71 Weight (lbs): 123 Respiratory Rate (breaths/min): 18 Body Mass Index (BMI): 24 Blood Pressure (mmHg): 119/45 Reference Range: 80 - 120 mg / dl Electronic Signature(s) Signed: 04/03/2018 4:18:46 PM By: Curtis Sites Entered By: Curtis Sites on 04/03/2018 15:01:29

## 2018-04-10 NOTE — Progress Notes (Signed)
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 current dressing which is specifically the Iodoflex. Obviously this is a known side effect of  the iodine in the dressing and I believe that may be giving her trouble. No fevers, chills, nausea, or vomiting noted at this time. Otherwise the wound does appear to be doing well. LATERIA, ALDERMAN ([id number]) 01/30/18 on evaluation today patient appears to be doing well in regard to her right anterior lower extremity ulcer. She notes that this does seem to be smaller and she wonders why we did not start the Prisma dressing sooner since it has made such a big difference in such a short amount of time. I explained that obviously we have to wait for the wound to get to a certain point along his healing path before we can initiate the Prisma otherwise it will not be effective. Therefore once the wound became clean it was then time to initiate the Prisma. Nonetheless good news is she is noting excellent improvement she does still have some discomfort but nothing as significant as previously  noted. 03/12/18 on evaluation today patient appears to be doing very well in regard to her right anterior lower extremity ulcer. She has been tolerating the dressing changes without complication. There does not appear to be any evidence of infection which is good news. Each week she continues to show signs of improvement as far as the wound size is concerned. This is definitely good news. 03/19/18 on evaluation today patient appears to be a little bit smaller in regard to her wound. Fortunately she does not seem to have any evidence of infection which is good news. She is making progress although it is somewhat slow currently. She does have some depth to the wound that was slough covering the surface of the wound. Fortunately she overall seems to be in the correct direction as far as her friends although she is proceeding in that direction very slowly. Currently will been using Prisma. 03/26/18 on evaluation today patient appears to be doing very well in regard to her right anterior lower extremity ulcer. This has been very slow to heal but fortunately does seem to be showing signs of improvement. With that being said she unfortunately is having issues right now with two small skin tears on the left lower extremity which are new and occurred as a result of her walker falling yesterday and striking her in the leg. Unfortunately it appears that the scan has pulled back and is not going to be able to be pulled back over the surface of the wound unfortunately. She has not been having a lot of pain although when this is touched or cleansed she does have more discomfort. 04/03/18 on evaluation today the patient appears to be doing a little better in regard to her right anterior lower extremity ulcer. She has been tolerating the dressing changes without complication. With that being said she does continue to have some issues at this point in regard to her left lower extremity where she had a more recent skin  tear noted last week. In fact she actually has two. It's doing a little bit better but still show some issues going on at this point. Patient History Information obtained from Patient. Family History Cancer - Mother,Siblings, Diabetes - Father, Heart Disease - Father,Siblings, Hypertension - Father,Siblings, Lung Disease - Siblings, No family history of Hereditary Spherocytosis, Kidney Disease, Seizures, Stroke, Thyroid Problems, Tuberculosis. Social History Never smoker, Marital Status - Married, Alcohol Use - Never, Drug Use - No History, Caffeine Use - Never. Medical And Surgical History Notes Constitutional Symptoms (General Health) Back pain Ear/Nose/Mouth/Throat bilateral hearing aides  Cardiovascular pacemaker Review of Systems (ROS) Constitutional Symptoms (General Health) Denies complaints or symptoms of Fever, Chills. Respiratory The patient has no complaints or symptoms. Cardiovascular The patient has no complaints or symptoms. Psychiatric The patient has no complaints or symptoms. [name], [name] ([id number]) Objective Constitutional Well-nourished and well-hydrated in no acute distress. Vitals Time Taken: 3:00 PM, Height: 60 in, Weight: 123 lbs, BMI: 24, Temperature: 97.9 F, Pulse: 71 bpm, Respiratory Rate: 18 breaths/min, Blood Pressure: 119/45 mmHg. Respiratory normal breathing without difficulty. clear to auscultation bilaterally. Cardiovascular regular rate and rhythm with normal S1, S2. Psychiatric this patient is able to make decisions and demonstrates good insight into disease process. Alert and Oriented x 3. pleasant and cooperative. General Notes: Patient's wound actually did show a necrotic flap that I tried to save and get to read here but it does not seem to be doing so at this point. For that reason actually did have to go ahead and debride this away today she tolerated that without complication and post debridement the wound bed appear to be doing  much better. Overall I do think this will heal very well although I'm still somewhat concerned about the fact that she continues to have injuries frequently I meant to ask her about the derma saver protectors for her lower extremities that I gave her the information about last week that I did not have a chance to do that today. Integumentary (Hair, Skin) Wound #5 status is Open. Original cause of wound was Trauma. The wound is located on the Right,Anterior Lower Leg. The wound measures 0.3cm length x 0.2cm width x 0.1cm depth; 0.047cm^2 area and 0.005cm^3 volume. There is Fat Layer (Subcutaneous Tissue) Exposed exposed. There is no tunneling or undermining noted. There is a large amount of serosanguineous drainage noted. The wound margin is distinct with the outline attached to the wound base. There is large (67-100%) red granulation within the wound bed. There is a small (1-33%) amount of necrotic tissue within the wound bed including Adherent Slough. The periwound skin appearance exhibited: Induration, Scarring. The periwound skin appearance did not exhibit: Callus, Crepitus, Excoriation, Rash, Dry/Scaly, Maceration, Atrophie Blanche, Cyanosis, Ecchymosis, Hemosiderin Staining, Mottled, Pallor, Rubor, Erythema. Periwound temperature was noted as No Abnormality. The periwound has tenderness on palpation. Wound #7 status is Open. Original cause of wound was Trauma. The wound is located on the Left,Proximal Lower Leg. The wound measures 1.6cm length x 0.5cm width x 0.2cm depth; 0.628cm^2 area and 0.126cm^3 volume. There is Fat Layer (Subcutaneous Tissue) Exposed exposed. There is no tunneling or undermining noted. There is a large amount of serous drainage noted. The wound margin is flat and intact. There is small (1-33%) red granulation within the wound bed. There is a large (67-100%) amount of necrotic tissue within the wound bed including Eschar and Adherent Slough. The periwound skin appearance  did not exhibit: Callus, Crepitus, Excoriation, Induration, Rash, Scarring, Dry/Scaly, Maceration, Atrophie Blanche, Cyanosis, Ecchymosis, Hemosiderin Staining, Mottled, Pallor, Rubor, Erythema. Periwound temperature was noted as No Abnormality. The periwound has tenderness on palpation. Wound #8 status is Open. Original cause of wound was Trauma. The wound is located on the Left,Distal Lower Leg. The wound measures 0.7cm length x 1.2cm width x 0.1cm depth; 0.66cm^2 area and 0.066cm^3 volume. There is no tunneling or undermining noted. There is a medium amount of serous drainage noted. The wound margin is flat and intact. There is [name], [name] ([id number]) medium (34-66%) red granulation within the wound bed. There is a medium (  34-66%) amount of necrotic tissue within the wound bed including Adherent Slough. The periwound skin appearance did not exhibit: Callus, Crepitus, Excoriation, Induration, Rash, Scarring, Dry/Scaly, Maceration, Atrophie Blanche, Cyanosis, Ecchymosis, Hemosiderin Staining, Mottled, Pallor, Rubor, Erythema. Periwound temperature was noted as No Abnormality. 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 There was a Excisional Skin/Subcutaneous Tissue Debridement with a total area of 0.06 sq cm performed by STONE III, Chenell Lozon E., PA-C. With the following instrument(s): Curette. to remove Viable and Non-Viable tissue/material Material removed includes Subcutaneous Tissue, and Slough, Fibrin/Exudate, and [place] after achieving pain control using Lidocaine  4% Topical Solution. No specimens were taken. A time out was conducted at 15:15, prior to the start of the procedure. A Minimum amount of bleeding was controlled with Pressure. The procedure was tolerated well with a pain level of 0 throughout and a pain level of 0 following the procedure. Post Debridement Measurements: 0.3cm length x 0.2cm width x 0.2cm depth; 0.009cm^3 volume. Character of Wound/Ulcer Post Debridement requires further debridement. Post procedure Diagnosis Wound #5: Same as Pre-Procedure Wound #7 Pre-procedure diagnosis of Wound #7 is a Trauma, Other located on the Left,Proximal Lower Leg . There was a Excisional Skin/Subcutaneous Tissue Debridement with a total area of 0.8 sq cm performed by STONE III, Isauro Skelley E., PA-C. With the following instrument(s): Curette. to remove Viable and Non-Viable tissue/material Material removed includes Subcutaneous Tissue, and Slough, Fibrin/Exudate, and [place] after achieving pain control using Lidocaine 4% Topical Solution. No specimens were taken. A time out was conducted at 15:15, prior to the start of the procedure. A Minimum amount of bleeding was controlled with Pressure. The procedure was tolerated well with a pain level of 0 throughout and a pain level of 0 following the procedure. Post Debridement Measurements: 1.7cm length x 0.6cm width x 0.1cm depth; 0.08cm^3 volume. Character of Wound/Ulcer Post Debridement requires further debridement. Post procedure Diagnosis Wound #7: Same as Pre-Procedure Wound #8 Pre-procedure diagnosis of Wound #8 is a Trauma, Other located on the Left,Distal Lower Leg . There was a Excisional Skin/Subcutaneous Tissue Debridement with a total area of 0.84 sq cm performed by STONE III, Kelii Chittum E., PA-C. With the following instrument(s): Curette. to remove Viable and Non-Viable tissue/material Material removed includes Subcutaneous Tissue, and Slough, Fibrin/Exudate, and [place] after achieving pain control using  Lidocaine 4% Topical Solution. No specimens were taken. A time out was conducted at 15:15, prior to the start of the procedure. A Minimum amount of bleeding was controlled with Pressure. The procedure was tolerated well with a pain level of 0 throughout and a pain level of 0 following the procedure. Post Debridement Measurements: 0.8cm length x 1.3cm width x 0.1cm depth; 0.082cm^3 volume. Character of Wound/Ulcer Post Debridement requires further debridement. AMAURI, [name] ([id number]) Post procedure Diagnosis Wound #8: Same as Pre-Procedure Plan Wound Cleansing: Wound #5 Right,Anterior Lower Leg: Clean wound with Normal Saline. Wound #7 Left,Proximal Lower Leg: Clean wound with Normal Saline. Wound #8 Left,Distal Lower Leg: Clean wound with Normal Saline. Anesthetic (add to Medication List): Wound #5 Right,Anterior Lower Leg: Topical Lidocaine 4% cream applied to wound bed prior  to debridement (In Clinic Only). Benzocaine Topical Anesthetic Spray applied to wound bed prior to debridement (In Clinic Only). Wound #7 Left,Proximal Lower Leg: Topical Lidocaine 4% cream applied to wound bed prior to debridement (In Clinic Only). Benzocaine Topical Anesthetic Spray applied to wound bed prior to debridement (In Clinic Only). Wound #8 Left,Distal Lower Leg: Topical Lidocaine 4% cream applied to wound bed prior to debridement (In Clinic Only). Benzocaine Topical Anesthetic Spray applied to wound bed prior to debridement (In Clinic Only). Primary Wound Dressing: Wound #5 Right,Anterior Lower Leg: Other: - endoform Wound #7 Left,Proximal Lower Leg: Xeroform Wound #8 Left,Distal Lower Leg: Xeroform Secondary Dressing: Wound #5 Right,Anterior Lower Leg: Boardered Foam Dressing Wound #7 Left,Proximal Lower Leg: Boardered Foam Dressing Wound #8 Left,Distal Lower Leg: Boardered Foam Dressing Dressing Change Frequency: Wound #5 Right,Anterior Lower Leg: Change dressing every other  day. Follow-up Appointments: Wound #5 Right,Anterior Lower Leg: Return Appointment in 1 week. Additional Orders / Instructions: Wound #5 Right,Anterior Lower Leg: Increase protein intake. The following medication(s) was prescribed: lidocaine topical 4 % cream 1 1 cream topical was prescribed at facility I Natasha going to suggest at this point that we continue with the Current wound care measures for both lower extremities currently. She's in agreement this plan. Will subsequently see were things stand in one weeks time when we see her for reevaluation. [name], [name] ([id number]) Please see above for specific wound care orders. We will see patient for re-evaluation in 1 week(s) here in the clinic. If anything worsens or changes patient will contact our office for additional recommendations. Electronic Signature(s) Signed: 04/03/2018 8:59:01 PM By: Lenda Kelp PA-C Entered By: Lenda Kelp on 04/03/2018 20:31:42 [name] ([id number]) -------------------------------------------------------------------------------- ROS/PFSH Details Patient Name: [name] Date of Service: 04/03/2018 2:30 PM Medical Record Number: [id number] Patient Account Number: [id number] Date of Birth/Sex: 11/26/1930 (84 y.o. F) Treating RN: Phillis Haggis Primary Care Provider: Aram Beecham Other Clinician: Referring Provider: Aram Beecham Treating Provider/Extender: Linwood Dibbles, Toma Arts Weeks in Treatment: 20 Information Obtained From Patient Wound History Do you currently have one or more open woundso Yes How many open wounds do you currently haveo 1 Approximately how long have you had your woundso 10 weeks How have you been treating your wound(s) until nowo mupirocin Has your wound(s) ever healed and then re-openedo No Have you had any lab work done in the past montho No Have you tested positive for an antibiotic resistant organism (MRSA, VRE)o No Have you tested positive for osteomyelitis  (bone infection)o No Have you had any tests for circulation on your legso Yes Who ordered the testo dr schnier [facility] Have you had other problems associated with your woundso Swelling Constitutional Symptoms (General Health) Complaints and Symptoms: Negative for: Fever; Chills Medical History: Past Medical History Notes: Back pain Eyes Medical History: Negative for: Cataracts; Glaucoma; Optic Neuritis Ear/Nose/Mouth/Throat Medical History: Negative for: Chronic sinus problems/congestion; Middle ear problems Past Medical History Notes: bilateral hearing aides Hematologic/Lymphatic Medical History: Negative for: Anemia; Hemophilia; Human Immunodeficiency Virus; Lymphedema; Sickle Cell Disease Respiratory Complaints and Symptoms: No Complaints or Symptoms Medical History: Negative for: Aspiration; Asthma; Chronic Obstructive Pulmonary Disease (COPD); Pneumothorax; Sleep Apnea; [name], [name] ([id number]) Tuberculosis Cardiovascular Complaints and Symptoms: No Complaints or Symptoms Medical History: Positive for: Hypertension Negative for: Angina; Arrhythmia; Congestive Heart Failure; Coronary Artery Disease; Deep Vein Thrombosis; Hypotension; Myocardial Infarction; Peripheral Arterial Disease; Peripheral Venous Disease; Phlebitis; Vasculitis Past Medical History Notes: pacemaker Gastrointestinal Medical History:  Negative for: Cirrhosis ; Colitis; Crohnos; Hepatitis A; Hepatitis B; Hepatitis C Endocrine Medical History: Negative for: Type I Diabetes; Type II Diabetes Genitourinary Medical History: Negative for: End Stage Renal Disease Immunological Medical History: Negative for: Lupus Erythematosus; Raynaudos; Scleroderma Musculoskeletal Medical History: Positive for: Gout; Osteoarthritis Negative for: Rheumatoid Arthritis; Osteomyelitis Neurologic Medical History: Positive for: Neuropathy - lower legs Negative for: Dementia; Quadriplegia;  Paraplegia; Seizure Disorder Oncologic Medical History: Negative for: Received Chemotherapy; Received Radiation Psychiatric Complaints and Symptoms: No Complaints or Symptoms Medical History: Negative for: Anorexia/bulimia; Confinement Anxiety [name], [name] ([id number]) Immunizations Pneumococcal Vaccine: Received Pneumococcal Vaccination: Yes Implantable Devices Family and Social History Cancer: Yes - Mother,Siblings; Diabetes: Yes - Father; Heart Disease: Yes - Father,Siblings; Hereditary Spherocytosis: No; Hypertension: Yes - Father,Siblings; Kidney Disease: No; Lung Disease: Yes - Siblings; Seizures: No; Stroke: No; Thyroid Problems: No; Tuberculosis: No; Never smoker; Marital Status - Married; Alcohol Use: Never; Drug Use: No History; Caffeine Use: Never; Financial Concerns: No; Food, Clothing or Shelter Needs: No; Support System Lacking: No; Transportation Concerns: No; Advanced Directives: No; Patient does not want information on Advanced Directives; Medical Power of Attorney: Yes - Nerissa Constantin (Copy provided) Physician Affirmation I have reviewed and agree with the above information. Electronic Signature(s) Signed: 04/03/2018 8:59:01 PM By: Lenda Kelp PA-C Signed: 04/06/2018 4:29:16 PM By: Alejandro Mulling Entered By: Lenda Kelp on 04/03/2018 20:30:31 [name] ([id number]) -------------------------------------------------------------------------------- SuperBill Details Patient Name: [name] Date of Service: 04/03/2018 Medical Record Number: [id number] Patient Account Number: [id number] Date of Birth/Sex: April 01, 1930 (84 y.o. F) Treating RN: Phillis Haggis Primary Care Provider: Aram Beecham Other Clinician: Referring Provider: Aram Beecham Treating Provider/Extender: Linwood Dibbles, Leontae Bostock Weeks in Treatment: 20 Diagnosis Coding ICD-10 Codes Code Description S81.811S Laceration without foreign body, right lower leg, sequela L97.812 Non-pressure  chronic ulcer of other part of right lower leg with fat layer exposed S81.802A Unspecified open wound, left lower leg, initial encounter L97.822 Non-pressure chronic ulcer of other part of left lower leg with fat layer exposed Facility Procedures CPT4 Code Description: [id number] 11042 - DEB SUBQ TISSUE 20 SQ CM/< ICD-10 Diagnosis Description L97.812 Non-pressure chronic ulcer of other part of right lower leg wit Modifier: h fat layer expo Quantity: 1 sed Physician Procedures CPT4 Code Description: [id number] 11042 - WC PHYS SUBQ TISS 20 SQ CM ICD-10 Diagnosis Description L97.812 Non-pressure chronic ulcer of other part of right lower leg wit Modifier: h fat layer expo Quantity: 1 sed Electronic Signature(s) Signed: 04/03/2018 8:59:01 PM By: Lenda Kelp PA-C Entered By: Lenda Kelp on 04/03/2018 20:31:52

## 2018-04-12 ENCOUNTER — Encounter: Payer: Medicare Other | Admitting: Physical Therapy

## 2018-04-13 ENCOUNTER — Ambulatory Visit: Payer: Medicare Other | Admitting: Physician Assistant

## 2018-04-13 NOTE — Progress Notes (Signed)
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 doing much better in regard to her right lower extremity ulcer this is becoming very small and is very close to completely resolving. Her left lower extremity ulcer seem to be doing better although there still some discomfort she's having overall slough it is becoming less and I do think that with good debridement today this will continue to show signs of good improvement. Fortunately she has been tolerating the dressings without complication. Patient History Information obtained from Patient. Family History Cancer - Mother,Siblings, Diabetes - Father, Heart Disease - Father,Siblings, Hypertension - Father,Siblings, Lung Disease - Siblings, No family history of Hereditary Spherocytosis, Kidney Disease, Seizures, Stroke, Thyroid Problems, Tuberculosis. Social History Never smoker, Marital Status - Married, Alcohol Use - Never, Drug Use - No History, Caffeine Use - Never. Medical And Surgical History Notes Constitutional Symptoms (General Health) Back  pain Ear/Nose/Mouth/Throat bilateral hearing aides Cardiovascular pacemaker Review of Systems (ROS) Constitutional Symptoms (General Health) Denies complaints or symptoms of Fever, Chills. Respiratory [name], [name] ([id number]) The patient has no complaints or symptoms. Cardiovascular The patient has no complaints or symptoms. Psychiatric The patient has no complaints or symptoms. Objective Constitutional Well-nourished and well-hydrated in no acute distress. Vitals Time Taken: 2:15 PM, Height: 60 in, Weight: 123 lbs, BMI: 24, Temperature: 97.8 F, Pulse: 69 bpm, Respiratory Rate: 16 breaths/min, Blood Pressure: 112/47 mmHg. Respiratory normal breathing without difficulty. Psychiatric this patient is able to make decisions and demonstrates good insight into disease process. Alert and Oriented x 3. pleasant and cooperative. General Notes: Patient's right lower extremity ulcer did not require sharp debridement today it appears to be doing excellent. The left lower extremity ulcer does seem to be showing signs of slough which is gonna require sharp debridement in this was performed today without complication. She did have some mild discomfort but nothing significant. Post debridement the wound beds both appear to be doing much better I'm gonna recommend initiating Prisma to try to help with more appropriate speedy healing. Patient will continue to wear her current compression stockings. Integumentary (Hair, Skin) Wound #5 status is Open. Original cause of wound was Trauma. The wound is located on the Right,Anterior Lower Leg. The wound measures 0.2cm length x 0.2cm width x 0.1cm depth; 0.031cm^2 area and 0.003cm^3 volume. There is Fat Layer (Subcutaneous Tissue) Exposed exposed. There is no tunneling or undermining noted. There is a small amount of serosanguineous drainage noted. The wound margin is distinct with the outline attached to the wound base. There is large (67-100%) red  granulation within the wound bed. There is a small (1-33%) amount of necrotic tissue within the wound bed including Adherent Slough. The periwound skin appearance exhibited: Induration, Scarring. The periwound  skin appearance did not exhibit: Callus, Crepitus, Excoriation, Rash, Dry/Scaly, Maceration, Atrophie Blanche, Cyanosis, Ecchymosis, Hemosiderin Staining, Mottled, Pallor, Rubor, Erythema. Periwound temperature was noted as No Abnormality. The periwound has tenderness on palpation. Wound #7 status is Open. Original cause of wound was Trauma. The wound is located on the Left,Proximal Lower Leg. The wound measures 1.6cm length x 0.9cm width x 0.1cm depth; 1.131cm^2 area and 0.113cm^3 volume. There is Fat Layer (Subcutaneous Tissue) Exposed exposed. There is no tunneling or undermining noted. There is a large amount of serous drainage noted. The wound margin is flat and intact. There is small (1-33%) red granulation within the wound bed. There is a large (67-100%) amount of necrotic tissue within the wound bed including Eschar and Adherent Slough. The periwound skin appearance did not exhibit: Callus, Crepitus, Excoriation, Induration, Rash, Scarring, Dry/Scaly, Maceration, Atrophie Blanche, Cyanosis, Ecchymosis, Hemosiderin Staining, Mottled, Pallor, Rubor, Erythema. Periwound temperature was noted as No Abnormality. The periwound has tenderness on palpation. Wound #8 status is Open. Original cause of wound was Trauma. The wound is located on the Left,Distal Lower Leg. The wound measures 0.7cm length x 1.4cm width x 0.1cm depth; 0.77cm^2 area and 0.077cm^3 volume. There is no tunneling or undermining noted. There is a medium amount of serous drainage noted. The wound margin is flat and intact. There is [name], [name] ([id number]) medium (34-66%) red granulation within the wound bed. There is a medium (34-66%) amount of necrotic tissue within the wound bed including Adherent Slough. The  periwound skin appearance did not exhibit: Callus, Crepitus, Excoriation, Induration, Rash, Scarring, Dry/Scaly, Maceration, Atrophie Blanche, Cyanosis, Ecchymosis, Hemosiderin Staining, Mottled, Pallor, Rubor, Erythema. Periwound temperature was noted as No Abnormality. 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 There was a Excisional Skin/Subcutaneous Tissue Debridement with a total area of 1.44 sq cm performed by STONE III, HOYT E., PA-C. With the following instrument(s): Curette. to remove Viable and Non-Viable tissue/material Material removed includes Subcutaneous Tissue, and Slough, Skin: Dermis, Skin: Epidermis, Biofilm, and Slough after achieving pain control using Other (LIDOCAINE 4%). No specimens were taken. A time out was conducted at 15:21, prior to the start of the procedure. A Minimum amount of bleeding was controlled with Pressure. The procedure was tolerated well with a pain level of 0 throughout and a pain level of 0 following the procedure. Post Debridement Measurements: 1.6cm length x 0.9cm width x 0.1cm depth; 0.113cm^3 volume. Character of Wound/Ulcer Post Debridement is stable. Post procedure Diagnosis Wound #7: Same as Pre-Procedure Wound #8 Pre-procedure diagnosis of Wound #8 is a Trauma, Other located on the Left,Distal Lower Leg . There was a Excisional Skin/Subcutaneous Tissue Debridement with a total area of 0.98 sq cm performed by STONE III, HOYT E., PA-C. With the following instrument(s): Curette. to remove Viable and Non-Viable  tissue/material Material removed includes Subcutaneous Tissue, and Slough, Skin: Dermis, Skin: Epidermis, Biofilm, and Slough after achieving pain control using Other (LIDOCAINE 4%). No specimens were taken. A time out was conducted at 15:21, prior to the start of the procedure. A Minimum amount of bleeding was controlled with Pressure. The procedure was tolerated  well with a pain level of 0 throughout and a pain level of 0 following the procedure. Post Debridement Measurements: 0.7cm length x 1.4cm width x 0.1cm depth; 0.077cm^3 volume. Character of Wound/Ulcer Post Debridement is stable. Post procedure Diagnosis Wound #8: Same as Pre-Procedure Plan Wound Cleansing: Wound #5 Right,Anterior Lower Leg: Clean wound with Normal Saline. [name], [name] Kitchen ([id number]) Wound #7 Left,Proximal Lower Leg: Clean wound with Normal Saline. Wound #8 Left,Distal Lower Leg: Clean wound with Normal Saline. Anesthetic (add to Medication List): Wound #5 Right,Anterior Lower Leg: Topical Lidocaine 4% cream applied to wound bed prior to debridement (In Clinic Only). Benzocaine Topical Anesthetic Spray applied to wound bed prior to debridement (In Clinic Only). Wound #7 Left,Proximal Lower Leg: Topical Lidocaine 4% cream applied to wound bed prior to debridement (In Clinic Only). Benzocaine Topical Anesthetic Spray applied to wound bed prior to debridement (In Clinic Only). Wound #8 Left,Distal Lower Leg: Topical Lidocaine 4% cream applied to wound bed prior to debridement (In Clinic Only). Primary Wound Dressing: Wound #5 Right,Anterior Lower Leg: Silver Collagen Wound #7 Left,Proximal Lower Leg: Silver Collagen Wound #8 Left,Distal Lower Leg: Silver Collagen Secondary Dressing: Wound #5 Right,Anterior Lower Leg: Boardered Foam Dressing Wound #7 Left,Proximal Lower Leg: Boardered Foam Dressing Wound #8 Left,Distal Lower Leg: Boardered Foam Dressing Dressing Change Frequency: Wound #5 Right,Anterior  Lower Leg: Change dressing every other day. Follow-up Appointments: Wound #5 Right,Anterior Lower Leg: Return Appointment in 1 week. Additional Orders / Instructions: Wound #5 Right,Anterior Lower Leg: Increase protein intake. I'm gonna suggest that we initiate the above wound care orders for all wounds involved at this point. This is going to be including the Prisma. We will see were things stand in one weeks time we see her for reevaluation. Hopefully the right lower extremity ulcer is going to close shortly I know she's becoming quite frustrated with the site and how long it has taken. She still has not gotten the leg protectors yet that I recommended although she has looked into it. Please see above for specific wound care orders. We will see patient for re-evaluation in 1 week(s) here in the clinic. If anything worsens or changes patient will contact our office for additional recommendations. Electronic Signature(s) Signed: 04/10/2018 8:17:44 AM By: Lenda Kelp PA-C Entered By: Lenda Kelp on 04/09/2018 17:40:58 [name] ([id number]) -------------------------------------------------------------------------------- ROS/PFSH Details Patient Name: [name] Date of Service: 04/09/2018 2:00 PM Medical Record Number: [id number] Patient Account Number: [id number] Date of Birth/Sex: 1930-11-04 (84 y.o. F) Treating RN: Renne Crigler Primary Care Provider: Aram Beecham Other Clinician: Referring Provider: Aram Beecham Treating Provider/Extender: Linwood Dibbles, HOYT Weeks in Treatment: 20 Information Obtained From Patient Wound History Do you currently have one or more open woundso Yes How many open wounds do you currently haveo 1 Approximately how long have you had your woundso 10 weeks How have you been treating your wound(s) until nowo mupirocin Has your wound(s) ever healed and then re-openedo No Have you had any lab work done in the past montho No Have you  tested positive for an antibiotic resistant organism (MRSA, VRE)o No Have you tested positive for osteomyelitis (bone infection)o No Have you had any tests for circulation on your legso Yes Who ordered the testo dr schnier [facility] Have you had other problems associated with your woundso Swelling Constitutional Symptoms (General Health) Complaints and Symptoms: Negative for: Fever; Chills Medical History: Past Medical History Notes: Back pain Eyes Medical History: Negative for:  Cataracts; Glaucoma; Optic Neuritis Ear/Nose/Mouth/Throat Medical History: Negative for: Chronic sinus problems/congestion; Middle ear problems Past Medical History Notes: bilateral hearing aides Hematologic/Lymphatic Medical History: Negative for: Anemia; Hemophilia; Human Immunodeficiency Virus; Lymphedema; Sickle Cell Disease Respiratory Complaints and Symptoms: No Complaints or Symptoms Medical History: Negative for: Aspiration; Asthma; Chronic Obstructive Pulmonary Disease (COPD); Pneumothorax; Sleep Apnea; SHEALA, DOSH ([id number]) Tuberculosis Cardiovascular Complaints and Symptoms: No Complaints or Symptoms Medical History: Positive for: Hypertension Negative for: Angina; Arrhythmia; Congestive Heart Failure; Coronary Artery Disease; Deep Vein Thrombosis; Hypotension; Myocardial Infarction; Peripheral Arterial Disease; Peripheral Venous Disease; Phlebitis; Vasculitis Past Medical History Notes: pacemaker Gastrointestinal Medical History: Negative for: Cirrhosis ; Colitis; Crohnos; Hepatitis A; Hepatitis B; Hepatitis C Endocrine Medical History: Negative for: Type I Diabetes; Type II Diabetes Genitourinary Medical History: Negative for: End Stage Renal Disease Immunological Medical History: Negative for: Lupus Erythematosus; Raynaudos; Scleroderma Musculoskeletal Medical History: Positive for: Gout; Osteoarthritis Negative for: Rheumatoid Arthritis;  Osteomyelitis Neurologic Medical History: Positive for: Neuropathy - lower legs Negative for: Dementia; Quadriplegia; Paraplegia; Seizure Disorder Oncologic Medical History: Negative for: Received Chemotherapy; Received Radiation Psychiatric Complaints and Symptoms: No Complaints or Symptoms Medical History: Negative for: Anorexia/bulimia; Confinement Anxiety [name], [name] ([id number]) Immunizations Pneumococcal Vaccine: Received Pneumococcal Vaccination: Yes Implantable Devices Family and Social History Cancer: Yes - Mother,Siblings; Diabetes: Yes - Father; Heart Disease: Yes - Father,Siblings; Hereditary Spherocytosis: No; Hypertension: Yes - Father,Siblings; Kidney Disease: No; Lung Disease: Yes - Siblings; Seizures: No; Stroke: No; Thyroid Problems: No; Tuberculosis: No; Never smoker; Marital Status - Married; Alcohol Use: Never; Drug Use: No History; Caffeine Use: Never; Financial Concerns: No; Food, Clothing or Shelter Needs: No; Support System Lacking: No; Transportation Concerns: No; Advanced Directives: No; Patient does not want information on Advanced Directives; Medical Power of Attorney: Yes - Charnese Federici (Copy provided) Physician Affirmation I have reviewed and agree with the above information. Electronic Signature(s) Signed: 04/10/2018 8:17:44 AM By: Lenda Kelp PA-C Signed: 04/10/2018 3:57:53 PM By: Renne Crigler Entered By: Lenda Kelp on 04/09/2018 17:39:56 [name] ([id number]) -------------------------------------------------------------------------------- SuperBill Details Patient Name: [name] Date of Service: 04/09/2018 Medical Record Number: [id number] Patient Account Number: [id number] Date of Birth/Sex: Oct 20, 1930 (84 y.o. F) Treating RN: Renne Crigler Primary Care Provider: Aram Beecham Other Clinician: Referring Provider: Aram Beecham Treating Provider/Extender: Linwood Dibbles, HOYT Weeks in Treatment: 20 Diagnosis  Coding ICD-10 Codes Code Description S81.811S Laceration without foreign body, right lower leg, sequela L97.812 Non-pressure chronic ulcer of other part of right lower leg with fat layer exposed S81.802A Unspecified open wound, left lower leg, initial encounter L97.822 Non-pressure chronic ulcer of other part of left lower leg with fat layer exposed Facility Procedures CPT4 Code Description: [id number] 11042 - DEB SUBQ TISSUE 20 SQ CM/< ICD-10 Diagnosis Description L97.822 Non-pressure chronic ulcer of other part of left lower leg with Modifier: fat layer expos Quantity: 1 ed Physician Procedures CPT4 Code Description: [id number] 11042 - WC PHYS SUBQ TISS 20 SQ CM ICD-10 Diagnosis Description L97.822 Non-pressure chronic ulcer of other part of left lower leg with Modifier: fat layer expos Quantity: 1 ed Electronic Signature(s) Signed: 04/10/2018 8:17:44 AM By: Lenda Kelp PA-C Entered By: Lenda Kelp on 04/09/2018 17:41:23

## 2018-04-14 NOTE — Progress Notes (Signed)
[name], [name] ([id number]) Visit Report for 04/09/2018 Arrival Information Details Patient Name: [name], [name]. Date of Service: 04/09/2018 2:00 PM Medical Record Number: [id number] Patient Account Number: [id number] Date of Birth/Sex: 01-08-1930 (84 y.o. F) Treating RN: Renne Crigler Primary Care Obadiah Dennard: Aram Beecham Other Clinician: Referring Klye Besecker: Aram Beecham Treating Jyquan Kenley/Extender: Linwood Dibbles, HOYT Weeks in Treatment: 20 Visit Information History Since Last Visit Added or deleted any medications: No Patient Arrived: Walker Any new allergies or adverse reactions: No Arrival Time: 14:15 Had a fall or experienced change in No Accompanied By: caregiver activities of daily living that may affect Transfer Assistance: None risk of falls: Patient Identification Verified: Yes Signs or symptoms of abuse/neglect since last visito No Secondary Verification Process Completed: Yes Hospitalized since last visit: No Patient Requires Transmission-Based No Implantable device outside of the clinic excluding No Precautions: cellular tissue based products placed in the center Patient Has Alerts: No since last visit: Has Dressing in Place as Prescribed: Yes Pain Present Now: No Electronic Signature(s) Signed: 04/09/2018 5:05:53 PM By: Dayton Martes RCP, RRT, CHT Entered By: Dayton Martes on 04/09/2018 14:17:02 [name] ([id number]) -------------------------------------------------------------------------------- Encounter Discharge Information Details Patient Name: [name] Date of Service: 04/09/2018 2:00 PM Medical Record Number: [id number] Patient Account Number: [id number] Date of Birth/Sex: 1930-07-18 (84 y.o. F) Treating RN: Phillis Haggis Primary Care Dawana Asper: Aram Beecham Other Clinician: Referring Jaylan Duggar: Aram Beecham Treating Somnang Mahan/Extender: Linwood Dibbles, HOYT Weeks in Treatment: 20 Encounter Discharge  Information Items Discharge Pain Level: 0 Discharge Condition: Stable Ambulatory Status: Walker Discharge Destination: Home Transportation: Private Auto Accompanied By: caregiver Schedule Follow-up Appointment: Yes Medication Reconciliation completed and No provided to Patient/Care Ward Boissonneault: Patient Clinical Summary of Care: Declined Electronic Signature(s) Signed: 04/09/2018 4:29:39 PM By: Alejandro Mulling Entered By: Alejandro Mulling on 04/09/2018 16:29:38 [name] ([id number]) -------------------------------------------------------------------------------- Lower Extremity Assessment Details Patient Name: [name] Date of Service: 04/09/2018 2:00 PM Medical Record Number: [id number] Patient Account Number: [id number] Date of Birth/Sex: 12/23/1929 (84 y.o. F) Treating RN: Curtis Sites Primary Care Makailey Hodgkin: Aram Beecham Other Clinician: Referring Ludivina Guymon: Aram Beecham Treating Moe Brier/Extender: Linwood Dibbles, HOYT Weeks in Treatment: 20 Vascular Assessment Pulses: Dorsalis Pedis Palpable: [Left:Yes] [Right:Yes] Posterior Tibial Extremity colors, hair growth, and conditions: Extremity Color: [Left:Hyperpigmented] [Right:Hyperpigmented] Hair Growth on Extremity: [Left:No] [Right:No] Temperature of Extremity: [Left:Warm] [Right:Warm] Capillary Refill: [Left:< 3 seconds] [Right:< 3 seconds] Toe Nail Assessment Left: Right: Thick: Yes Yes Discolored: No No Deformed: No No Improper Length and Hygiene: No No Electronic Signature(s) Signed: 04/09/2018 4:56:01 PM By: Curtis Sites Entered By: Curtis Sites on 04/09/2018 14:26:35 [name] ([id number]) -------------------------------------------------------------------------------- Multi Wound Chart Details Patient Name: [name] Date of Service: 04/09/2018 2:00 PM Medical Record Number: [id number] Patient Account Number: [id number] Date of Birth/Sex: 10-Apr-1930 (84 y.o. F) Treating RN:  Renne Crigler Primary Care Wilba Mutz: Aram Beecham Other Clinician: Referring Trinitee Horgan: Aram Beecham Treating Emmette Katt/Extender: Linwood Dibbles, HOYT Weeks in Treatment: 20 Vital Signs Height(in): 60 Pulse(bpm): 69 Weight(lbs): 123 Blood Pressure(mmHg): 112/47 Body Mass Index(BMI): 24 Temperature(F): 97.8 Respiratory Rate 16 (breaths/min): Photos: [5:No Photos] [7:No Photos] [8:No Photos] Wound Location: [5:Right Lower Leg - Anterior] [7:Left Lower Leg - Proximal] [8:Left Lower Leg - Distal] Wounding Event: [5:Trauma] [7:Trauma] [8:Trauma] Primary Etiology: [5:Trauma, Other] [7:Trauma, Other] [8:Trauma, Other] Comorbid History: [5:Hypertension, Gout, Osteoarthritis, Neuropathy] [7:Hypertension, Gout, Osteoarthritis, Neuropathy] [8:Hypertension, Gout, Osteoarthritis, Neuropathy] Date Acquired: [5:09/05/2017] [7:03/22/2018] [8:03/22/2018] Weeks of Treatment: [5:20] [7:2] [8:2] Wound Status: [5:Open] [7:Open] [8:Open] Measurements L x W x D [5:0.2x0.2x0.1] [  7:1.6x0.9x0.1] [8:0.7x1.4x0.1] (cm) Area (cm) : [5:0.031] [7:1.131] [8:0.77] Volume (cm) : [5:0.003] [7:0.113] [8:0.077] % Reduction in Area: [5:97.60%] [7:-80.10%] [8:38.70%] % Reduction in Volume: [5:97.70%] [7:10.30%] [8:38.90%] Classification: [5:Full Thickness With Exposed Support Structures] [7:Full Thickness Without Exposed Support Structures] [8:Partial Thickness] Exudate Amount: [5:Small] [7:Large] [8:Medium] Exudate Type: [5:Serosanguineous] [7:Serous] [8:Serous] Exudate Color: [5:red, brown] [7:amber] [8:amber] Wound Margin: [5:Distinct, outline attached] [7:Flat and Intact] [8:Flat and Intact] Granulation Amount: [5:Large (67-100%)] [7:Small (1-33%)] [8:Medium (34-66%)] Granulation Quality: [5:Red] [7:Red] [8:Red] Necrotic Amount: [5:Small (1-33%)] [7:Large (67-100%)] [8:Medium (34-66%)] Necrotic Tissue: [5:Adherent Slough] [7:Eschar, Adherent Slough] [8:Adherent Slough] Exposed Structures: [5:Fat Layer  (Subcutaneous Tissue) Exposed: Yes Fascia: No Tendon: No Muscle: No Joint: No Bone: No] [7:Fat Layer (Subcutaneous Tissue) Exposed: Yes Fascia: No Tendon: No Muscle: No Joint: No Bone: No] [8:Fascia: No Fat Layer (Subcutaneous Tissue)  Exposed: No Tendon: No Muscle: No Joint: No Bone: No] Epithelialization: [5:Small (1-33%)] [7:None] [8:None] Periwound Skin Texture: [5:Induration: Yes Scarring: Yes Excoriation: No Callus: No] [7:Excoriation: No Induration: No Callus: No Crepitus: No] [8:Excoriation: No Induration: No Callus: No Crepitus: No] Crepitus: No Rash: No Rash: No Rash: No Scarring: No Scarring: No Periwound Skin Moisture: Maceration: No Maceration: No Maceration: No Dry/Scaly: No Dry/Scaly: No Dry/Scaly: No Periwound Skin Color: Atrophie Blanche: No Atrophie Blanche: No Atrophie Blanche: No Cyanosis: No Cyanosis: No Cyanosis: No Ecchymosis: No Ecchymosis: No Ecchymosis: No Erythema: No Erythema: No Erythema: No Hemosiderin Staining: No Hemosiderin Staining: No Hemosiderin Staining: No Mottled: No Mottled: No Mottled: No Pallor: No Pallor: No Pallor: No Rubor: No Rubor: No Rubor: No Temperature: No Abnormality No Abnormality No Abnormality Tenderness on Palpation: Yes Yes No Wound Preparation: Ulcer Cleansing: Ulcer Cleansing: Ulcer Cleansing: Rinsed/Irrigated with Saline Rinsed/Irrigated with Saline Rinsed/Irrigated with Saline Topical Anesthetic Applied: Topical Anesthetic Applied: Topical Anesthetic Applied: Other: lidocaine 4% Other: lidocaine 4% Other: lidocaine 4% Treatment Notes Electronic Signature(s) Signed: 04/09/2018 5:23:19 PM By: Renne Crigler Entered By: Renne Crigler on 04/09/2018 15:20:16 [name] ([id number]) -------------------------------------------------------------------------------- Multi-Disciplinary Care Plan Details Patient Name: [name] Date of Service: 04/09/2018 2:00 PM Medical Record Number:  [id number] Patient Account Number: [id number] Date of Birth/Sex: 04/05/1930 (84 y.o. F) Treating RN: Renne Crigler Primary Care Yair Dusza: Aram Beecham Other Clinician: Referring Meghna Hagmann: Aram Beecham Treating Narya Beavin/Extender: Linwood Dibbles, HOYT Weeks in Treatment: 20 Active Inactive ` Orientation to the Wound Care Program Nursing Diagnoses: Knowledge deficit related to the wound healing center program Goals: Patient/caregiver will verbalize understanding of the Wound Healing Center Program Date Initiated: 11/14/2017 Target Resolution Date: 11/28/2017 Goal Status: Active Interventions: Provide education on orientation to the wound center Notes: ` Wound/Skin Impairment Nursing Diagnoses: Impaired tissue integrity Knowledge deficit related to ulceration/compromised skin integrity Goals: Patient/caregiver will verbalize understanding of skin care regimen Date Initiated: 11/14/2017 Target Resolution Date: 11/28/2017 Goal Status: Active Ulcer/skin breakdown will have a volume reduction of 30% by week 4 Date Initiated: 11/14/2017 Target Resolution Date: 11/28/2017 Goal Status: Active Interventions: Assess patient/caregiver ability to obtain necessary supplies Assess patient/caregiver ability to perform ulcer/skin care regimen upon admission and as needed Assess ulceration(s) every visit Treatment Activities: Skin care regimen initiated : 11/14/2017 Notes: Electronic Signature(s) Signed: 04/09/2018 5:23:19 PM By: Lenore Cordia, Lawernce Keas ([id number]) Entered By: Renne Crigler on 04/09/2018 15:19:56 [name] ([id number]) -------------------------------------------------------------------------------- Pain Assessment Details Patient Name: [name] Date of Service: 04/09/2018 2:00 PM Medical Record Number: [id number] Patient Account Number: [id number] Date of Birth/Sex: Oct 05, 1930 (84 y.o. F) Treating RN: Renne Crigler Primary Care Neave Lenger:  Aram Beecham  Other Clinician: Referring Caitlin Ainley: Aram Beecham Treating Kaoru Benda/Extender: Linwood Dibbles, HOYT Weeks in Treatment: 20 Active Problems Location of Pain Severity and Description of Pain Patient Has Paino No Site Locations Pain Management and Medication Current Pain Management: Electronic Signature(s) Signed: 04/09/2018 5:05:53 PM By: Dayton Martes RCP, RRT, CHT Signed: 04/09/2018 5:23:19 PM By: Renne Crigler Entered By: Dayton Martes on 04/09/2018 14:17:21 [name] ([id number]) -------------------------------------------------------------------------------- Patient/Caregiver Education Details Patient Name: [name] Date of Service: 04/09/2018 2:00 PM Medical Record Number: [id number] Patient Account Number: [id number] Date of Birth/Gender: [birth year] (84 y.o. F) Treating RN: Phillis Haggis Primary Care Physician: Aram Beecham Other Clinician: Referring Physician: Aram Beecham Treating Physician/Extender: Skeet Simmer in Treatment: 20 Education Assessment Education Provided To: Patient Education Topics Provided Wound/Skin Impairment: Handouts: Caring for Your Ulcer, Other: change dressing as ordered Methods: Demonstration, Explain/Verbal Responses: State content correctly Electronic Signature(s) Signed: 04/11/2018 4:27:21 PM By: Alejandro Mulling Entered By: Alejandro Mulling on 04/09/2018 16:29:54 [name] ([id number]) -------------------------------------------------------------------------------- Wound Assessment Details Patient Name: [name] Date of Service: 04/09/2018 2:00 PM Medical Record Number: [id number] Patient Account Number: [id number] Date of Birth/Sex: 1930-08-13 (84 y.o. F) Treating RN: Curtis Sites Primary Care Kaile Bixler: Aram Beecham Other Clinician: Referring Emidio Warrell: Aram Beecham Treating Michaelann Gunnoe/Extender: Linwood Dibbles, HOYT Weeks in Treatment: 20 Wound  Status Wound Number: 5 Primary Etiology: Trauma, Other Wound Location: Right Lower Leg - Anterior Wound Status: Open Wounding Event: Trauma Comorbid Hypertension, Gout, Osteoarthritis, History: Neuropathy Date Acquired: 09/05/2017 Weeks Of Treatment: 20 Clustered Wound: No Photos Photo Uploaded By: Curtis Sites on 04/09/2018 16:48:08 Wound Measurements Length: (cm) 0.2 Width: (cm) 0.2 Depth: (cm) 0.1 Area: (cm) 0.031 Volume: (cm) 0.003 % Reduction in Area: 97.6% % Reduction in Volume: 97.7% Epithelialization: Small (1-33%) Tunneling: No Undermining: No Wound Description Full Thickness With Exposed Support Foul Classification: Structures Sloug Wound Margin: Distinct, outline attached Exudate Small Amount: Exudate Type: Serosanguineous Exudate Color: red, brown Odor After Cleansing: No h/Fibrino Yes Wound Bed Granulation Amount: Large (67-100%) Exposed Structure Granulation Quality: Red Fascia Exposed: No Necrotic Amount: Small (1-33%) Fat Layer (Subcutaneous Tissue) Exposed: Yes Necrotic Quality: Adherent Slough Tendon Exposed: No Muscle Exposed: No Joint Exposed: No Bone Exposed: No MITHRA, BRANDEN V. ([id number]) Periwound Skin Texture Texture Color No Abnormalities Noted: No No Abnormalities Noted: No Callus: No Atrophie Blanche: No Crepitus: No Cyanosis: No Excoriation: No Ecchymosis: No Induration: Yes Erythema: No Rash: No Hemosiderin Staining: No Scarring: Yes Mottled: No Pallor: No Moisture Rubor: No No Abnormalities Noted: No Dry / Scaly: No Temperature / Pain Maceration: No Temperature: No Abnormality Tenderness on Palpation: Yes Wound Preparation Ulcer Cleansing: Rinsed/Irrigated with Saline Topical Anesthetic Applied: Other: lidocaine 4%, Treatment Notes Wound #5 (Right, Anterior Lower Leg) 1. Cleansed with: Clean wound with Normal Saline 2. Anesthetic Topical Lidocaine 4% cream to wound bed prior to debridement 3.  Peri-wound Care: Skin Prep 4. Dressing Applied: Prisma Ag 5. Secondary Dressing Applied Bordered Foam Dressing Electronic Signature(s) Signed: 04/09/2018 4:56:01 PM By: Curtis Sites Entered By: Curtis Sites on 04/09/2018 14:24:00 [name] ([id number]) -------------------------------------------------------------------------------- Wound Assessment Details Patient Name: [name] Date of Service: 04/09/2018 2:00 PM Medical Record Number: [id number] Patient Account Number: [id number] Date of Birth/Sex: January 09, 1930 (84 y.o. F) Treating RN: Curtis Sites Primary Care Loxley Cibrian: Aram Beecham Other Clinician: Referring Tyaira Heward: Aram Beecham Treating Correy Weidner/Extender: Linwood Dibbles, HOYT Weeks in Treatment: 20 Wound Status Wound Number: 7 Primary Etiology: Trauma, Other Wound Location: Left Lower Leg - Proximal Wound Status: Open  Wounding Event: Trauma Comorbid Hypertension, Gout, Osteoarthritis, History: Neuropathy Date Acquired: 03/22/2018 Weeks Of Treatment: 2 Clustered Wound: No Photos Photo Uploaded By: Curtis Sites on 04/09/2018 16:48:58 Wound Measurements Length: (cm) 1.6 Width: (cm) 0.9 Depth: (cm) 0.1 Area: (cm) 1.131 Volume: (cm) 0.113 % Reduction in Area: -80.1% % Reduction in Volume: 10.3% Epithelialization: None Tunneling: No Undermining: No Wound Description Full Thickness Without Exposed Support Foul Classification: Structures Sloug Wound Margin: Flat and Intact Exudate Large Amount: Exudate Type: Serous Exudate Color: amber Odor After Cleansing: No h/Fibrino Yes Wound Bed Granulation Amount: Small (1-33%) Exposed Structure Granulation Quality: Red Fascia Exposed: No Necrotic Amount: Large (67-100%) Fat Layer (Subcutaneous Tissue) Exposed: Yes Necrotic Quality: Eschar, Adherent Slough Tendon Exposed: No Muscle Exposed: No Joint Exposed: No Bone Exposed: No KAMEREN, REPINSKI V. ([id number]) Periwound Skin Texture Texture  Color No Abnormalities Noted: No No Abnormalities Noted: No Callus: No Atrophie Blanche: No Crepitus: No Cyanosis: No Excoriation: No Ecchymosis: No Induration: No Erythema: No Rash: No Hemosiderin Staining: No Scarring: No Mottled: No Pallor: No Moisture Rubor: No No Abnormalities Noted: No Dry / Scaly: No Temperature / Pain Maceration: No Temperature: No Abnormality Tenderness on Palpation: Yes Wound Preparation Ulcer Cleansing: Rinsed/Irrigated with Saline Topical Anesthetic Applied: Other: lidocaine 4%, Treatment Notes Wound #7 (Left, Proximal Lower Leg) 1. Cleansed with: Clean wound with Normal Saline 2. Anesthetic Topical Lidocaine 4% cream to wound bed prior to debridement 3. Peri-wound Care: Skin Prep 4. Dressing Applied: Prisma Ag 5. Secondary Dressing Applied Bordered Foam Dressing Electronic Signature(s) Signed: 04/09/2018 4:56:01 PM By: Curtis Sites Entered By: Curtis Sites on 04/09/2018 14:24:21 [name] ([id number]) -------------------------------------------------------------------------------- Wound Assessment Details Patient Name: [name] Date of Service: 04/09/2018 2:00 PM Medical Record Number: [id number] Patient Account Number: [id number] Date of Birth/Sex: December 18, 1929 (84 y.o. F) Treating RN: Curtis Sites Primary Care Dontray Haberland: Aram Beecham Other Clinician: Referring Chandrika Sandles: Aram Beecham Treating Yaniv Lage/Extender: Linwood Dibbles, HOYT Weeks in Treatment: 20 Wound Status Wound Number: 8 Primary Etiology: Trauma, Other Wound Location: Left Lower Leg - Distal Wound Status: Open Wounding Event: Trauma Comorbid Hypertension, Gout, Osteoarthritis, History: Neuropathy Date Acquired: 03/22/2018 Weeks Of Treatment: 2 Clustered Wound: No Photos Photo Uploaded By: Curtis Sites on 04/09/2018 16:48:59 Wound Measurements Length: (cm) 0.7 Width: (cm) 1.4 Depth: (cm) 0.1 Area: (cm) 0.77 Volume: (cm) 0.077 %  Reduction in Area: 38.7% % Reduction in Volume: 38.9% Epithelialization: None Tunneling: No Undermining: No Wound Description Classification: Partial Thickness Foul Wound Margin: Flat and Intact Sloug Exudate Amount: Medium Exudate Type: Serous Exudate Color: amber Odor After Cleansing: No h/Fibrino No Wound Bed Granulation Amount: Medium (34-66%) Exposed Structure Granulation Quality: Red Fascia Exposed: No Necrotic Amount: Medium (34-66%) Fat Layer (Subcutaneous Tissue) Exposed: No Necrotic Quality: Adherent Slough Tendon Exposed: No Muscle Exposed: No Joint Exposed: No Bone Exposed: No Periwound Skin Texture AMAHYA, GEITZ V. ([id number]) Texture Color No Abnormalities Noted: No No Abnormalities Noted: No Callus: No Atrophie Blanche: No Crepitus: No Cyanosis: No Excoriation: No Ecchymosis: No Induration: No Erythema: No Rash: No Hemosiderin Staining: No Scarring: No Mottled: No Pallor: No Moisture Rubor: No No Abnormalities Noted: No Dry / Scaly: No Temperature / Pain Maceration: No Temperature: No Abnormality Wound Preparation Ulcer Cleansing: Rinsed/Irrigated with Saline Topical Anesthetic Applied: Other: lidocaine 4%, Treatment Notes Wound #8 (Left, Distal Lower Leg) 1. Cleansed with: Clean wound with Normal Saline 2. Anesthetic Topical Lidocaine 4% cream to wound bed prior to debridement 3. Peri-wound Care: Skin Prep 4. Dressing Applied: Prisma Ag 5. Secondary Dressing  Applied Bordered Foam Dressing Electronic Signature(s) Signed: 04/09/2018 4:56:01 PM By: Curtis Sites Entered By: Curtis Sites on 04/09/2018 14:24:39 [name] ([id number]) -------------------------------------------------------------------------------- Vitals Details Patient Name: [name] Date of Service: 04/09/2018 2:00 PM Medical Record Number: [id number] Patient Account Number: [id number] Date of Birth/Sex: 06/17/1930 (84 y.o. F) Treating RN: Renne Crigler Primary Care Ryon Layton: Aram Beecham Other Clinician: Referring Jaydn Fincher: Aram Beecham Treating Madlynn Lundeen/Extender: Linwood Dibbles, HOYT Weeks in Treatment: 20 Vital Signs Time Taken: 14:15 Temperature (F): 97.8 Height (in): 60 Pulse (bpm): 69 Weight (lbs): 123 Respiratory Rate (breaths/min): 16 Body Mass Index (BMI): 24 Blood Pressure (mmHg): 112/47 Reference Range: 80 - 120 mg / dl Electronic Signature(s) Signed: 04/09/2018 5:05:53 PM By: Dayton Martes RCP, RRT, CHT Entered By: Dayton Martes on 04/09/2018 14:17:58

## 2018-04-16 ENCOUNTER — Ambulatory Visit: Payer: Medicare Other | Admitting: Physician Assistant

## 2018-04-17 ENCOUNTER — Encounter: Payer: Medicare Other | Admitting: Physical Therapy

## 2018-04-17 ENCOUNTER — Encounter: Payer: Medicare Other | Attending: Physician Assistant | Admitting: Physician Assistant

## 2018-04-17 DIAGNOSIS — L98492 Non-pressure chronic ulcer of skin of other sites with fat layer exposed: Secondary | ICD-10-CM | POA: Diagnosis not present

## 2018-04-17 DIAGNOSIS — L97822 Non-pressure chronic ulcer of other part of left lower leg with fat layer exposed: Secondary | ICD-10-CM | POA: Insufficient documentation

## 2018-04-17 DIAGNOSIS — I10 Essential (primary) hypertension: Secondary | ICD-10-CM | POA: Insufficient documentation

## 2018-04-18 ENCOUNTER — Other Ambulatory Visit
Admission: RE | Admit: 2018-04-18 | Discharge: 2018-04-18 | Disposition: A | Discharge status: Home / Self Care | Payer: Medicare Other | Source: Ambulatory Visit | Attending: Physician Assistant | Admitting: Physician Assistant

## 2018-04-18 DIAGNOSIS — L0889 Other specified local infections of the skin and subcutaneous tissue: Secondary | ICD-10-CM | POA: Insufficient documentation

## 2018-04-19 DIAGNOSIS — M810 Age-related osteoporosis without current pathological fracture: Secondary | ICD-10-CM | POA: Insufficient documentation

## 2018-04-19 NOTE — Progress Notes (Signed)
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 Left,Distal Lower Leg. The wound measures 1.5cm length x 0.7cm width x 0.1cm depth; 0.825cm^2 area and 0.082cm^3 volume. 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 There was a Excisional Skin/Subcutaneous Tissue Debridement with a total area of 1.05 sq cm performed by STONE III, Solange Emry E., PA-C. With the following instrument(s): Curette. to remove Viable tissue/material Material removed includes Subcutaneous Tissue and Fibrin/Exudate and after achieving pain control using Lidocaine 4% Topical Solution. 1 specimen was taken by a Swab and [name], [name] ([id number]) sent to the lab per facility protocol. A time out was conducted at 16:24,  prior to the start of the procedure. A Minimum amount of bleeding was controlled with Pressure. The procedure was tolerated well with a pain level of 0 throughout and a pain level of 0 following the procedure. Patient s Level of Consciousness post procedure was recorded as Awake and Alert and post- procedure vitals were taken including Temperature: 98.4 F, Pulse: 68 bpm, Respiratory Rate: 18 breaths/min, Blood Pressure: (131)/(60) mmHg. Post Debridement Measurements: 1.5cm length x 0.7cm width x 0.2cm depth; 0.165cm^3 volume. Character of Wound/Ulcer Post Debridement requires further debridement. Post procedure Diagnosis Wound #8: Same as Pre-Procedure Plan Wound Cleansing: Wound #7 Left,Proximal Lower Leg: Clean wound with Normal Saline. Wound #8 Left,Distal Lower Leg: Clean wound with Normal Saline. Anesthetic (add to Medication List): Wound #7 Left,Proximal Lower Leg: Topical Lidocaine 4% cream applied to wound bed prior to debridement ([facility] Only). Benzocaine Topical Anesthetic Spray applied to wound bed prior to debridement ([facility] Only). Wound #8 Left,Distal Lower Leg: Topical Lidocaine 4% cream applied to wound bed prior to debridement ([facility] Only). Primary Wound Dressing: Wound #7 Left,Proximal Lower Leg: Hydrogel Silver Collagen Wound #8 Left,Distal Lower Leg: Hydrogel Silver Collagen Secondary Dressing: Wound #7 Left,Proximal Lower Leg: Boardered Foam Dressing Wound #8 Left,Distal Lower Leg: Boardered Foam Dressing Dressing Change Frequency: Wound #7 Left,Proximal Lower Leg: Change dressing every other day. Wound #8 Left,Distal Lower Leg: Change dressing every other day. Follow-up Appointments: Wound #7 Left,Proximal Lower Leg: Return Appointment [facility] 1 week. Wound #8 Left,Distal Lower Leg: Return Appointment [facility] 1 week. Edema Control: Wound #7 Left,Proximal Lower Leg: Elevate legs to the level of the heart and pump ankles as often as possible Wound #8  Left,Distal Lower Leg: Elevate legs to the level of the heart and pump ankles as often as possible Additional Orders / Instructions: Wound #7 Left,Proximal Lower Leg: Increase protein intake. Wound #8 Left,Distal Lower Leg: Increase protein intake. [name], [name] ([id number]) Laboratory ordered were: Wound culture routine - left distal lower leg The following medication(s) was prescribed: lidocaine topical 4 % cream 1 1 cream topical was prescribed at facility Bactrim DS oral 800 mg-160 mg tablet 1 1 tablet oral taken 2 times a day for 10 days starting 04/17/2018 We will see were things stand [facility] one weeks time we see her for reevaluation. [facility] the meantime I'm  gonna recommend that she continue with the Prisma and again I will go ahead and send [facility] the prescription for the Septra for her. We will see how things stand [facility] one weeks time we see her for reevaluation. If anything changes [facility] the meantime she will contact our office. Please see above for specific wound care orders. We will see patient for re-evaluation [facility] 1 week(s) here [facility] the clinic. If anything worsens or changes patient will contact our office for additional recommendations. Electronic Signature(s) Signed: 04/17/2018 5:56:33 PM By: Lenda Kelp PA-C Entered By: Lenda Kelp on 04/17/2018 17:07:55 [name] ([id number]) -------------------------------------------------------------------------------- ROS/PFSH Details Patient Name: [name] Date of Service: 04/17/2018 4:00 PM Medical Record Number: [id number] Patient Account Number: [id number] Date of Birth/Sex: 1930-07-11 (84 y.o. F) Treating RN: Phillis Haggis Primary Care Provider: Aram Beecham Other Clinician: Referring Provider: Aram Beecham Treating Provider/Extender: Linwood Dibbles, Deantae Shackleton Weeks [facility] Treatment: 22 Information Obtained From Patient Wound History Do you currently have one or more open woundso Yes How many open wounds do you currently haveo  1 Approximately how long have you had your woundso 10 weeks How have you been treating your wound(s) until nowo mupirocin Has your wound(s) ever healed and then re-openedo No Have you had any lab work done [facility] the past montho No Have you tested positive for an antibiotic resistant organism (MRSA, VRE)o No Have you tested positive for osteomyelitis (bone infection)o No Have you had any tests for circulation on your legso Yes Who ordered the testo dr schnier [facility] Have you had other problems associated with your woundso Swelling Constitutional Symptoms (General Health) Complaints and Symptoms: Negative for: Fever; Chills Medical History: Past Medical History Notes: Back pain Eyes Medical History: Negative for: Cataracts; Glaucoma; Optic Neuritis Ear/Nose/Mouth/Throat Medical History: Negative for: Chronic sinus problems/congestion; Middle ear problems Past Medical History Notes: bilateral hearing aides Hematologic/Lymphatic Medical History: Negative for: Anemia; Hemophilia; Human Immunodeficiency Virus; Lymphedema; Sickle Cell Disease Respiratory Complaints and Symptoms: No Complaints or Symptoms Medical History: Negative for: Aspiration; Asthma; Chronic Obstructive Pulmonary Disease (COPD); Pneumothorax; Sleep Apnea; [name], [name] ([id number]) Tuberculosis Cardiovascular Complaints and Symptoms: No Complaints or Symptoms Medical History: Positive for: Hypertension Negative for: Angina; Arrhythmia; Congestive Heart Failure; Coronary Artery Disease; Deep Vein Thrombosis; Hypotension; Myocardial Infarction; Peripheral Arterial Disease; Peripheral Venous Disease; Phlebitis; Vasculitis Past Medical History Notes: pacemaker Gastrointestinal Medical History: Negative for: Cirrhosis ; Colitis; Crohnos; Hepatitis A; Hepatitis B; Hepatitis C Endocrine Medical History: Negative for: Type I Diabetes; Type II Diabetes Genitourinary Medical  History: Negative for: End Stage Renal Disease Immunological Medical History: Negative for: Lupus Erythematosus; Raynaudos; Scleroderma Musculoskeletal Medical History: Positive for: Gout; Osteoarthritis Negative for: Rheumatoid Arthritis; Osteomyelitis Neurologic Medical History: Positive for: Neuropathy - lower legs Negative for: Dementia; Quadriplegia; Paraplegia; Seizure Disorder Oncologic Medical History: Negative for: Received Chemotherapy; Received Radiation Psychiatric Complaints and Symptoms: No Complaints or Symptoms Medical History: Negative for: Anorexia/bulimia; Confinement Anxiety [name], [name] ([id number]) Immunizations Pneumococcal Vaccine: Received Pneumococcal Vaccination: Yes Implantable Devices Family and Social History Cancer: Yes - Mother,Siblings; Diabetes: Yes - Father; Heart Disease: Yes - Father,Siblings; Hereditary Spherocytosis: No; Hypertension: Yes - Father,Siblings; Kidney Disease: No; Lung Disease: Yes - Siblings; Seizures: No; Stroke: No; Thyroid Problems: No; Tuberculosis: No; Never smoker; Marital Status - Married; Alcohol Use: Never; Drug Use: No History; Caffeine Use: Never; Financial Concerns: No; Food, Clothing or Shelter Needs: No; Support System Lacking: No; Transportation Concerns: No; Advanced Directives: No; Patient does not want  information on Advanced Directives; Medical Power of Attorney: Yes - [name] (Copy provided) Physician Affirmation I have reviewed and agree with the above information. Electronic Signature(s) Signed: 04/17/2018 5:11:14 PM By: Alejandro Mulling Signed: 04/17/2018 5:56:33 PM By: Lenda Kelp PA-C Entered By: Lenda Kelp on 04/17/2018 17:05:35 [name] ([id number]) -------------------------------------------------------------------------------- SuperBill Details Patient Name: [name] Date of Service: 04/17/2018 Medical Record Number: [id number] Patient Account Number: [id number] Date  of Birth/Sex: 07-01-1930 (84 y.o. F) Treating RN: Phillis Haggis Primary Care Provider: Aram Beecham Other Clinician: Referring Provider: Aram Beecham Treating Provider/Extender: Linwood Dibbles, Shakendra Griffeth Weeks [facility] Treatment: 22 Diagnosis Coding ICD-10 Codes Code Description S81.811S Laceration without foreign body, right lower leg, sequela L97.812 Non-pressure chronic ulcer of other part of right lower leg with fat layer exposed S81.802A Unspecified open wound, left lower leg, initial encounter L97.822 Non-pressure chronic ulcer of other part of left lower leg with fat layer exposed Facility Procedures CPT4 Code Description: [id number] 11042 - DEB SUBQ TISSUE 20 SQ CM/< ICD-10 Diagnosis Description L97.822 Non-pressure chronic ulcer of other part of left lower leg with Modifier: fat layer expos Quantity: 1 ed Physician Procedures CPT4 Code Description: [id number] 99214 - WC PHYS LEVEL 4 - EST PT ICD-10 Diagnosis Description S81.811S Laceration without foreign body, right lower leg, sequela L97.812 Non-pressure chronic ulcer of other part of right lower leg wit S81.802A Unspecified  open wound, left lower leg, initial encounter L97.822 Non-pressure chronic ulcer of other part of left lower leg with Modifier: 25 h fat layer expo fat layer expos Quantity: 1 sed ed CPT4 Code Description: [id number] 11042 - WC PHYS SUBQ TISS 20 SQ CM ICD-10 Diagnosis Description L97.822 Non-pressure chronic ulcer of other part of left lower leg with Modifier: fat layer expos Quantity: 1 ed Electronic Signature(s) Signed: 04/17/2018 5:56:33 PM By: Lenda Kelp PA-C Entered By: Lenda Kelp on 04/17/2018 17:08:26

## 2018-04-20 LAB — AEROBIC CULTURE  (SUPERFICIAL SPECIMEN): GRAM STAIN: NONE SEEN

## 2018-04-20 LAB — AEROBIC CULTURE W GRAM STAIN (SUPERFICIAL SPECIMEN)

## 2018-04-21 NOTE — Progress Notes (Signed)
[name], [name] ([id number]) Visit Report for 04/17/2018 Arrival Information Details Patient Name: [name] Date of Service: 04/17/2018 4:00 PM Medical Record Number: [id number] Patient Account Number: [id number] Date of Birth/Sex: 1930-08-07 (84 y.o. F) Treating RN: Huel Coventry Primary Care Dakota Vanwart: Aram Beecham Other Clinician: Referring Lilliah Priego: Aram Beecham Treating Astrid Vides/Extender: Linwood Dibbles, HOYT Weeks in Treatment: 22 Visit Information History Since Last Visit Added or deleted any medications: No Patient Arrived: Dan Humphreys Any new allergies or adverse reactions: No Arrival Time: 15:57 Had a fall or experienced change in No Accompanied By: self activities of daily living that may affect Transfer Assistance: None risk of falls: Patient Identification Verified: Yes Signs or symptoms of abuse/neglect since last visito No Secondary Verification Process Completed: Yes Hospitalized since last visit: No Patient Requires Transmission-Based Precautions: No Implantable device outside of the clinic excluding No Patient Has Alerts: No cellular tissue based products placed in the center since last visit: Has Dressing in Place as Prescribed: Yes Pain Present Now: No Electronic Signature(s) Signed: 04/18/2018 5:47:31 PM By: Elliot Gurney, BSN, RN, CWS, Kim RN, BSN Entered By: Elliot Gurney, BSN, RN, CWS, Kim on 04/17/2018 15:58:17 [name] ([id number]) -------------------------------------------------------------------------------- Encounter Discharge Information Details Patient Name: [name] Date of Service: 04/17/2018 4:00 PM Medical Record Number: [id number] Patient Account Number: [id number] Date of Birth/Sex: 1930-03-14 (84 y.o. F) Treating RN: Renne Crigler Primary Care Galileo Colello: Aram Beecham Other Clinician: Referring Alice Burnside: Aram Beecham Treating Aleigh Grunden/Extender: Linwood Dibbles, HOYT Weeks in Treatment: 22 Encounter Discharge Information Items Discharge  Condition: Stable Ambulatory Status: Walker Discharge Destination: Home Transportation: Private Auto Schedule Follow-up Appointment: Yes Clinical Summary of Care: Electronic Signature(s) Signed: 04/17/2018 4:56:20 PM By: Renne Crigler Entered By: Renne Crigler on 04/17/2018 16:33:54 [name] ([id number]) -------------------------------------------------------------------------------- Lower Extremity Assessment Details Patient Name: [name] Date of Service: 04/17/2018 4:00 PM Medical Record Number: [id number] Patient Account Number: [id number] Date of Birth/Sex: 09/22/1930 (84 y.o. F) Treating RN: Huel Coventry Primary Care Lamees Gable: Aram Beecham Other Clinician: Referring Alick Lecomte: Aram Beecham Treating Doloris Servantes/Extender: Linwood Dibbles, HOYT Weeks in Treatment: 22 Edema Assessment Assessed: [Left: No] [Right: No] Edema: [Left: Yes] [Right: Yes] Vascular Assessment Claudication: Claudication Assessment [Left:None] [Right:None] Pulses: Dorsalis Pedis Palpable: [Left:Yes] [Right:Yes] Posterior Tibial Extremity colors, hair growth, and conditions: Extremity Color: [Left:Hyperpigmented] [Right:Hyperpigmented] Hair Growth on Extremity: [Left:No] [Right:No] Temperature of Extremity: [Left:Cool] [Right:Cool] Capillary Refill: [Left:< 3 seconds] [Right:< 3 seconds] Toe Nail Assessment Left: Right: Thick: No No Discolored: No No Deformed: No No Improper Length and Hygiene: No No Electronic Signature(s) Signed: 04/18/2018 5:47:31 PM By: Elliot Gurney, BSN, RN, CWS, Kim RN, BSN Entered By: Elliot Gurney, BSN, RN, CWS, Kim on 04/17/2018 16:11:36 [name] ([id number]) -------------------------------------------------------------------------------- Multi Wound Chart Details Patient Name: [name] Date of Service: 04/17/2018 4:00 PM Medical Record Number: [id number] Patient Account Number: [id number] Date of Birth/Sex: Nov 20, 1930 (84 y.o. F) Treating RN: Phillis Haggis Primary Care Khristina Janota: Aram Beecham Other Clinician: Referring Grier Czerwinski: Aram Beecham Treating Malonie Tatum/Extender: Linwood Dibbles, HOYT Weeks in Treatment: 22 Vital Signs Height(in): 60 Pulse(bpm): 70 Weight(lbs): 123 Blood Pressure(mmHg): 116/37 Body Mass Index(BMI): 24 Temperature(F): 97.7 Respiratory Rate 16 (breaths/min): Photos: [5:No Photos] [7:No Photos] [8:No Photos] Wound Location: [5:Right, Anterior Lower Leg] [7:Left, Proximal Lower Leg] [8:Left, Distal Lower Leg] Wounding Event: [5:Trauma] [7:Trauma] [8:Trauma] Primary Etiology: [5:Trauma, Other] [7:Trauma, Other] [8:Trauma, Other] Date Acquired: [5:09/05/2017] [7:03/22/2018] [8:03/22/2018] Weeks of Treatment: [5:22] [7:3] [8:3] Wound Status: [5:Open] [7:Open] [8:Open] Measurements L x W x D [5:0.1x0.1x0.1] [7:0.8x1.6x0.1] [8:1.5x0.7x0.1] (cm) Area (cm) : [5:0.008] [  7:1.005] [8:0.825] Volume (cm) : [5:0.001] [7:0.101] [8:0.082] % Reduction in Area: [5:99.40%] [7:-60.00%] [8:34.40%] % Reduction in Volume: [5:99.20%] [7:19.80%] [8:34.90%] Classification: [5:Full Thickness With Exposed Support Structures] [7:Full Thickness Without Exposed Support Structures] [8:Partial Thickness] Periwound Skin Texture: [5:No Abnormalities Noted] [7:No Abnormalities Noted] [8:No Abnormalities Noted] Periwound Skin Moisture: [5:No Abnormalities Noted] [7:No Abnormalities Noted] [8:No Abnormalities Noted] Periwound Skin Color: [5:No Abnormalities Noted No] [7:No Abnormalities Noted No] [8:No Abnormalities Noted No] Treatment Notes Electronic Signature(s) Signed: 04/17/2018 5:11:14 PM By: Alejandro Mulling Entered By: Alejandro Mulling on 04/17/2018 16:18:20 [name] ([id number]) -------------------------------------------------------------------------------- Multi-Disciplinary Care Plan Details Patient Name: [name] Date of Service: 04/17/2018 4:00 PM Medical Record Number: [id number] Patient Account Number:  [id number] Date of Birth/Sex: 1930-11-23 (84 y.o. F) Treating RN: Phillis Haggis Primary Care Charla Criscione: Aram Beecham Other Clinician: Referring Shandelle Borrelli: Aram Beecham Treating [name] Glendening/Extender: Linwood Dibbles, HOYT Weeks in Treatment: 22 Active Inactive ` Orientation to the Wound Care Program Nursing Diagnoses: Knowledge deficit related to the wound healing center program Goals: Patient/caregiver will verbalize understanding of the Wound Healing Center Program Date Initiated: 11/14/2017 Target Resolution Date: 11/28/2017 Goal Status: Active Interventions: Provide education on orientation to the wound center Notes: ` Wound/Skin Impairment Nursing Diagnoses: Impaired tissue integrity Knowledge deficit related to ulceration/compromised skin integrity Goals: Patient/caregiver will verbalize understanding of skin care regimen Date Initiated: 11/14/2017 Target Resolution Date: 11/28/2017 Goal Status: Active Ulcer/skin breakdown will have a volume reduction of 30% by week 4 Date Initiated: 11/14/2017 Target Resolution Date: 11/28/2017 Goal Status: Active Interventions: Assess patient/caregiver ability to obtain necessary supplies Assess patient/caregiver ability to perform ulcer/skin care regimen upon admission and as needed Assess ulceration(s) every visit Treatment Activities: Skin care regimen initiated : 11/14/2017 Notes: Electronic Signature(s) Signed: 04/17/2018 5:11:14 PM By: Vira Agar, Lawernce Keas ([id number]) Entered By: Alejandro Mulling on 04/17/2018 16:18:08 [name] ([id number]) -------------------------------------------------------------------------------- Pain Assessment Details Patient Name: [name] Date of Service: 04/17/2018 4:00 PM Medical Record Number: [id number] Patient Account Number: [id number] Date of Birth/Sex: 03/29/1930 (84 y.o. F) Treating RN: Huel Coventry Primary Care Pheobe Sandiford: Aram Beecham Other Clinician: Referring  Hong Moring: Aram Beecham Treating Sherene Plancarte/Extender: Linwood Dibbles, HOYT Weeks in Treatment: 22 Active Problems Location of Pain Severity and Description of Pain Patient Has Paino No Site Locations Pain Management and Medication Current Pain Management: Electronic Signature(s) Signed: 04/18/2018 5:47:31 PM By: Elliot Gurney, BSN, RN, CWS, Kim RN, BSN Entered By: Elliot Gurney, BSN, RN, CWS, Kim on 04/17/2018 15:58:32 [name] ([id number]) -------------------------------------------------------------------------------- Patient/Caregiver Education Details Patient Name: [name] Date of Service: 04/17/2018 4:00 PM Medical Record Number: [id number] Patient Account Number: [id number] Date of Birth/Gender: 04-09-1930 (84 y.o. F) Treating RN: Renne Crigler Primary Care Physician: Aram Beecham Other Clinician: Referring Physician: Aram Beecham Treating Physician/Extender: Skeet Simmer in Treatment: 22 Education Assessment Education Provided To: Patient Education Topics Provided Wound Debridement: Handouts: Wound Debridement Methods: Explain/Verbal Responses: State content correctly Wound/Skin Impairment: Handouts: Caring for Your Ulcer Methods: Explain/Verbal Responses: State content correctly Electronic Signature(s) Signed: 04/17/2018 4:56:20 PM By: Renne Crigler Entered By: Renne Crigler on 04/17/2018 16:34:15 [name] ([id number]) -------------------------------------------------------------------------------- Wound Assessment Details Patient Name: [name] Date of Service: 04/17/2018 4:00 PM Medical Record Number: [id number] Patient Account Number: [id number] Date of Birth/Sex: 1930/09/27 (84 y.o. F) Treating RN: Phillis Haggis Primary Care Halden Phegley: Aram Beecham Other Clinician: Referring Maddon Horton: Aram Beecham Treating Rhegan Trunnell/Extender: Linwood Dibbles, HOYT Weeks in Treatment: 22 Wound Status Wound Number: 5 Primary Etiology: Trauma,  Other Wound Location: Right Lower  Leg - Anterior Wound Status: Healed - Epithelialized Wounding Event: Trauma Comorbid Hypertension, Gout, Osteoarthritis, History: Neuropathy Date Acquired: 09/05/2017 Weeks Of Treatment: 22 Clustered Wound: No Wound Measurements Length: (cm) 0 % Reduc Width: (cm) 0 % Reduc Depth: (cm) 0 Epithel Area: (cm) 0 Tunnel Volume: (cm) 0 Underm tion in Area: 100% tion in Volume: 100% ialization: Large (67-100%) ing: No ining: No Wound Description Full Thickness With Exposed Support Foul Od Classification: Structures Slough/ Wound Margin: Distinct, outline attached Exudate None Present Amount: or After Cleansing: No Fibrino No Wound Bed Granulation Amount: None Present (0%) Exposed Structure Necrotic Amount: None Present (0%) Fascia Exposed: No Fat Layer (Subcutaneous Tissue) Exposed: Yes Tendon Exposed: No Muscle Exposed: No Joint Exposed: No Bone Exposed: No Periwound Skin Texture Texture Color No Abnormalities Noted: No No Abnormalities Noted: No Callus: No Atrophie Blanche: No Crepitus: No Cyanosis: No Excoriation: No Ecchymosis: No Induration: Yes Erythema: No Rash: No Hemosiderin Staining: No Scarring: Yes Mottled: No Pallor: No Moisture Rubor: No No Abnormalities Noted: No Dry / Scaly: No Temperature / Pain Maceration: No Temperature: No Abnormality Tenderness on Palpation: Yes [name], [name] ([id number]) Wound Preparation Ulcer Cleansing: Rinsed/Irrigated with Saline Topical Anesthetic Applied: None Electronic Signature(s) Signed: 04/17/2018 5:11:14 PM By: Alejandro Mulling Entered By: Alejandro Mulling on 04/17/2018 16:19:54 [name] ([id number]) -------------------------------------------------------------------------------- Wound Assessment Details Patient Name: [name] Date of Service: 04/17/2018 4:00 PM Medical Record Number: [id number] Patient Account Number: [id number] Date of Birth/Sex:  [birth year] (84 y.o. F) Treating RN: Huel Coventry Primary Care Tene Gato: Aram Beecham Other Clinician: Referring Estle Huguley: Aram Beecham Treating Carel Schnee/Extender: Linwood Dibbles, HOYT Weeks in Treatment: 22 Wound Status Wound Number: 7 Primary Etiology: Trauma, Other Wound Location: Left, Proximal Lower Leg Wound Status: Open Wounding Event: Trauma Date Acquired: 03/22/2018 Weeks Of Treatment: 3 Clustered Wound: No Wound Measurements Length: (cm) 0.8 Width: (cm) 1.6 Depth: (cm) 0.1 Area: (cm) 1.005 Volume: (cm) 0.101 % Reduction in Area: -60% % Reduction in Volume: 19.8% Wound Description Full Thickness Without Exposed Support Classification: Structures Periwound Skin Texture Texture Color No Abnormalities Noted: No No Abnormalities Noted: No Moisture No Abnormalities Noted: No Treatment Notes Wound #7 (Left, Proximal Lower Leg) 1. Cleansed with: Clean wound with Normal Saline 2. Anesthetic Topical Lidocaine 4% cream to wound bed prior to debridement 4. Dressing Applied: Hydrogel Prisma Ag 5. Secondary Dressing Applied Bordered Foam Dressing Electronic Signature(s) Signed: 04/18/2018 5:47:31 PM By: Elliot Gurney, BSN, RN, CWS, Kim RN, BSN Entered By: Elliot Gurney, BSN, RN, CWS, Kim on 04/17/2018 16:08:47 [name] ([id number]) -------------------------------------------------------------------------------- Wound Assessment Details Patient Name: [name] Date of Service: 04/17/2018 4:00 PM Medical Record Number: [id number] Patient Account Number: [id number] Date of Birth/Sex: 1930/01/28 (84 y.o. F) Treating RN: Huel Coventry Primary Care Vestal Crandall: Aram Beecham Other Clinician: Referring Trista Ciocca: Aram Beecham Treating Faizaan Falls/Extender: Linwood Dibbles, HOYT Weeks in Treatment: 22 Wound Status Wound Number: 8 Primary Etiology: Trauma, Other Wound Location: Left, Distal Lower Leg Wound Status: Open Wounding Event: Trauma Date Acquired: 03/22/2018 Weeks Of  Treatment: 3 Clustered Wound: No Wound Measurements Length: (cm) 1.5 Width: (cm) 0.7 Depth: (cm) 0.1 Area: (cm) 0.825 Volume: (cm) 0.082 % Reduction in Area: 34.4% % Reduction in Volume: 34.9% Wound Description Classification: Partial Thickness Periwound Skin Texture Texture Color No Abnormalities Noted: No No Abnormalities Noted: No Moisture No Abnormalities Noted: No Treatment Notes Wound #8 (Left, Distal Lower Leg) 1. Cleansed with: Clean wound with Normal Saline 2. Anesthetic Topical Lidocaine 4% cream to wound bed prior to debridement  4. Dressing Applied: Hydrogel Prisma Ag 5. Secondary Dressing Applied Bordered Foam Dressing Electronic Signature(s) Signed: 04/18/2018 5:47:31 PM By: Elliot Gurney, BSN, RN, CWS, Kim RN, BSN Entered By: Elliot Gurney, BSN, RN, CWS, Kim on 04/17/2018 16:08:48 [name] ([id number]) -------------------------------------------------------------------------------- Vitals Details Patient Name: [name] Date of Service: 04/17/2018 4:00 PM Medical Record Number: [id number] Patient Account Number: [id number] Date of Birth/Sex: 1930-03-29 (84 y.o. F) Treating RN: Huel Coventry Primary Care Dawsyn Zurn: Aram Beecham Other Clinician: Referring Taiesha Bovard: Aram Beecham Treating Lavante Toso/Extender: Linwood Dibbles, HOYT Weeks in Treatment: 22 Vital Signs Time Taken: 15:58 Temperature (F): 97.7 Height (in): 60 Pulse (bpm): 70 Weight (lbs): 123 Respiratory Rate (breaths/min): 16 Body Mass Index (BMI): 24 Blood Pressure (mmHg): 116/37 Reference Range: 80 - 120 mg / dl Electronic Signature(s) Signed: 04/18/2018 5:47:31 PM By: Elliot Gurney, BSN, RN, CWS, Kim RN, BSN Entered By: Elliot Gurney, BSN, RN, CWS, Kim on 04/17/2018 15:58:55

## 2018-04-24 ENCOUNTER — Ambulatory Visit: Payer: Medicare Other | Admitting: Physician Assistant

## 2018-04-24 ENCOUNTER — Encounter: Payer: Medicare Other | Admitting: Physical Therapy

## 2018-04-27 ENCOUNTER — Encounter: Payer: Medicare Other | Admitting: Physician Assistant

## 2018-04-27 DIAGNOSIS — L97822 Non-pressure chronic ulcer of other part of left lower leg with fat layer exposed: Secondary | ICD-10-CM | POA: Diagnosis not present

## 2018-04-29 NOTE — Progress Notes (Signed)
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 is located on the Left,Distal Lower Leg. The wound measures 1.4cm length x 0.6cm width x 0.1cm depth; 0.66cm^2 area and 0.066cm^3 volume. There is Fat Layer (Subcutaneous Tissue) Exposed exposed. There is no tunneling or undermining noted. There is a large amount of serous drainage noted. The wound margin is distinct with the outline attached to the wound base. There is medium (34-66%) red, hyper - granulation within the wound bed. There is a medium (34-66%) amount of necrotic tissue within the wound bed including Adherent Slough. The periwound skin appearance did not exhibit: Callus, Crepitus, Excoriation, Induration, Rash, Scarring, Dry/Scaly, Maceration, Atrophie Blanche, Cyanosis, Ecchymosis, Hemosiderin Staining, Mottled, Pallor, Rubor, Erythema. Periwound temperature was noted as No Abnormality. The periwound has tenderness on palpation. Wound #9 status is Open. Original cause of wound was Trauma. The wound is located on the Left Elbow. The  wound measures 0.3cm length x 0.3cm width x 0.2cm depth; 0.071cm^2 area and 0.014cm^3 volume. There is no tunneling or undermining noted. There is a large amount of serous drainage noted. The wound margin is distinct with the outline attached to the wound base. There is small (1-33%) pink granulation within the wound bed. There is a large (67-100%) amount of necrotic tissue within the wound bed including Adherent Slough. The periwound skin appearance exhibited: Excoriation, Maceration. The periwound skin appearance did not exhibit: Callus, Crepitus, Induration, Rash, Scarring, Dry/Scaly, Atrophie Blanche, Cyanosis, Ecchymosis, Hemosiderin Staining, Mottled, Pallor, Rubor, Erythema. Periwound temperature was noted as No Abnormality. The periwound has tenderness on palpation. 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 There was a Excisional Skin/Subcutaneous Tissue Debridement with a total area of 0.09 sq cm performed by STONE III, HOYT E., PA-C. With the following instrument(s): Curette to remove Viable and Non-Viable tissue/material. Material removed includes Subcutaneous Tissue and Slough and after achieving pain control using Lidocaine 4% Topical Solution. No specimens were taken. A time out was conducted at 14:22, prior to the start of the procedure. A Minimum amount of bleeding was controlled with Pressure. The procedure was tolerated well with a pain level of 0 throughout and a pain level of 0  following the procedure. Patient s Level of Consciousness post procedure was recorded as  Awake and Alert and post-procedure vitals were taken including Temperature: 98.4 F, Pulse: 84 bpm, Respiratory Rate: 16 breaths/min, Blood Pressure: (111)/(57) mmHg. Post Debridement Measurements: 0.8cm length x 0.5cm width x 0.5cm depth; 0.157cm^3 volume. Character of Wound/Ulcer Post Debridement is improved. Post procedure Diagnosis Wound #9: Same as Pre-Procedure Plan Wound Cleansing: Wound #7 Left,Proximal Lower Leg: Clean wound with Normal Saline. Wound #8 Left,Distal Lower Leg: Clean wound with Normal Saline. Wound #9 Left Elbow: Clean wound with Normal Saline. Anesthetic (add to Medication List): Wound #7 Left,Proximal Lower Leg: Topical Lidocaine 4% cream applied to wound bed prior to debridement (In Clinic Only). Benzocaine Topical Anesthetic Spray applied to wound bed prior to debridement (In Clinic Only). Wound #8 Left,Distal Lower Leg: Topical Lidocaine 4% cream applied to wound bed prior to debridement (In Clinic Only). Benzocaine Topical Anesthetic Spray applied to wound bed prior to debridement (In Clinic Only). Wound #9 Left Elbow: Topical Lidocaine 4% cream applied to wound bed prior to debridement (In Clinic Only). Benzocaine Topical Anesthetic Spray applied to wound bed prior to debridement (In Clinic Only). Primary Wound Dressing: Wound #7 Left,Proximal Lower Leg: Hydrogel Silver Collagen Wound #8 Left,Distal Lower Leg: Hydrogel [name], [name] ([id number]) Silver Collagen Wound #9 Left Elbow: Hydrogel Silver Collagen Secondary Dressing: Wound #7 Left,Proximal Lower Leg: Boardered Foam Dressing Wound #8 Left,Distal Lower Leg: Boardered Foam Dressing Wound #9 Left Elbow: Boardered Foam Dressing Dressing Change Frequency: Wound #7 Left,Proximal Lower Leg: Change dressing every other day. Wound #8 Left,Distal Lower Leg: Change dressing every other day. Wound #9 Left  Elbow: Change dressing every other day. Follow-up Appointments: Wound #7 Left,Proximal Lower Leg: Return Appointment in 1 week. Wound #8 Left,Distal Lower Leg: Return Appointment in 1 week. Wound #9 Left Elbow: Return Appointment in 1 week. Edema Control: Wound #7 Left,Proximal Lower Leg: Elevate legs to the level of the heart and pump ankles as often as possible Wound #8 Left,Distal Lower Leg: Elevate legs to the level of the heart and pump ankles as often as possible Additional Orders / Instructions: Wound #7 Left,Proximal Lower Leg: Increase protein intake. Wound #8 Left,Distal Lower Leg: Increase protein intake. Wound #9 Left Elbow: Increase protein intake. Since the patient seems to be doing well in regard to her legs I do not think we need to change anything and we're gonna go ahead and initiate treatment with the Prisma for the left elbow site as well which again is a surgical wound. She's in agreement with this plan. We will subsequently see were things stand in one weeks time when I see her for reevaluation. My hope is that we can get the wound to heal without her having to have additional surgery this is also her hope. We will see how things do over the next several weeks and she will also be having follow-ups with her orthopedist on a regular basis. Please see above for specific wound care orders. We will see patient for re-evaluation in 1 week(s) here in the clinic. If anything worsens or changes patient will contact our office for additional recommendations. Electronic Signature(s) Signed: 04/28/2018 1:18:07 PM By: Lenda Kelp PA-C Entered By: Lenda Kelp on 04/27/2018 14:42:30 [name] ([id number]) -------------------------------------------------------------------------------- ROS/PFSH Details Patient Name: [name] Date of Service: 04/27/2018 1:30 PM Medical Record Number: [id number] Patient Account Number: [id number] Date of Birth/Sex:  [birth year] (84 y.o. F) Treating RN: Curtis Sites Primary Care Provider: Aram Beecham Other Clinician: Referring Provider: Aram Beecham Treating Provider/Extender: Linwood Dibbles, HOYT Weeks in  Treatment: 23 Information Obtained From Patient Wound History Do you currently have one or more open woundso Yes How many open wounds do you currently haveo 1 Approximately how long have you had your woundso 10 weeks How have you been treating your wound(s) until nowo mupirocin Has your wound(s) ever healed and then re-openedo No Have you had any lab work done in the past montho No Have you tested positive for an antibiotic resistant organism (MRSA, VRE)o No Have you tested positive for osteomyelitis (bone infection)o No Have you had any tests for circulation on your legso Yes Who ordered the testo dr schnier [facility] Have you had other problems associated with your woundso Swelling Constitutional Symptoms (General Health) Complaints and Symptoms: Negative for: Fever; Chills Medical History: Past Medical History Notes: Back pain Eyes Medical History: Negative for: Cataracts; Glaucoma; Optic Neuritis Ear/Nose/Mouth/Throat Medical History: Negative for: Chronic sinus problems/congestion; Middle ear problems Past Medical History Notes: bilateral hearing aides Hematologic/Lymphatic Medical History: Negative for: Anemia; Hemophilia; Human Immunodeficiency Virus; Lymphedema; Sickle Cell Disease Respiratory Complaints and Symptoms: No Complaints or Symptoms Medical History: Negative for: Aspiration; Asthma; Chronic Obstructive Pulmonary Disease (COPD); Pneumothorax; Sleep Apnea; [name], [name] ([id number]) Tuberculosis Cardiovascular Complaints and Symptoms: No Complaints or Symptoms Medical History: Positive for: Hypertension Negative for: Angina; Arrhythmia; Congestive Heart Failure; Coronary Artery Disease; Deep Vein Thrombosis; Hypotension; Myocardial  Infarction; Peripheral Arterial Disease; Peripheral Venous Disease; Phlebitis; Vasculitis Past Medical History Notes: pacemaker Gastrointestinal Medical History: Negative for: Cirrhosis ; Colitis; Crohnos; Hepatitis A; Hepatitis B; Hepatitis C Endocrine Medical History: Negative for: Type I Diabetes; Type II Diabetes Genitourinary Medical History: Negative for: End Stage Renal Disease Immunological Medical History: Negative for: Lupus Erythematosus; Raynaudos; Scleroderma Musculoskeletal Medical History: Positive for: Gout; Osteoarthritis Negative for: Rheumatoid Arthritis; Osteomyelitis Neurologic Medical History: Positive for: Neuropathy - lower legs Negative for: Dementia; Quadriplegia; Paraplegia; Seizure Disorder Oncologic Medical History: Negative for: Received Chemotherapy; Received Radiation Psychiatric Complaints and Symptoms: No Complaints or Symptoms Medical History: Negative for: Anorexia/bulimia; Confinement Anxiety [name], [name] ([id number]) Immunizations Pneumococcal Vaccine: Received Pneumococcal Vaccination: Yes Implantable Devices Family and Social History Cancer: Yes - Mother,Siblings; Diabetes: Yes - Father; Heart Disease: Yes - Father,Siblings; Hereditary Spherocytosis: No; Hypertension: Yes - Father,Siblings; Kidney Disease: No; Lung Disease: Yes - Siblings; Seizures: No; Stroke: No; Thyroid Problems: No; Tuberculosis: No; Never smoker; Marital Status - Married; Alcohol Use: Never; Drug Use: No History; Caffeine Use: Never; Financial Concerns: No; Food, Clothing or Shelter Needs: No; Support System Lacking: No; Transportation Concerns: No; Advanced Directives: No; Patient does not want information on Advanced Directives; Medical Power of Attorney: Yes - Miller [place] (Copy provided) Physician Affirmation I have reviewed and agree with the above information. Electronic Signature(s) Signed: 04/27/2018 4:56:04 PM By: Curtis Sites Signed: 04/28/2018  1:18:07 PM By: Lenda Kelp PA-C Entered By: Lenda Kelp on 04/27/2018 14:39:52 [name] ([id number]) -------------------------------------------------------------------------------- SuperBill Details Patient Name: [name] Date of Service: 04/27/2018 Medical Record Number: [id number] Patient Account Number: [id number] Date of Birth/Sex: 1930-08-14 (84 y.o. F) Treating RN: Curtis Sites Primary Care Provider: Aram Beecham Other Clinician: Referring Provider: Aram Beecham Treating Provider/Extender: Linwood Dibbles, HOYT Weeks in Treatment: 23 Diagnosis Coding ICD-10 Codes Code Description [phone] Laceration without foreign body, right lower leg, sequela L97.822 Non-pressure chronic ulcer of other part of left lower leg with fat layer exposed T81.31XA Disruption of external operation (surgical) wound, not elsewhere classified, initial encounter L98.495 Non-pressure chronic ulcer of skin of other sites with  muscle involvement without evidence of necrosis Facility Procedures CPT4: Description Modifier Quantity Code [id number] 11042 - DEB SUBQ TISSUE 20 SQ CM/< 1 ICD-10 Diagnosis Description L98.495 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 Physician Procedures CPT4: Description Modifier Quantity Code [id number] 99213 - WC PHYS LEVEL 3 - EST PT 25 1 ICD-10 Diagnosis Description S81.811S Laceration without foreign body, right lower leg, sequela L97.822 Non-pressure chronic ulcer of other part of left lower leg with  fat layer exposed T81.31XA Disruption of external operation (surgical) wound, not elsewhere classified, initial encounter L98.495 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

## 2018-04-29 NOTE — Progress Notes (Signed)
LEXISS, LAMKIN ([id number]) Visit Report for 04/27/2018 Arrival Information Details Patient Name: [name], [name] Date of Service: 04/27/2018 1:30 PM Medical Record Number: [id number] Patient Account Number: [id number] Date of Birth/Sex: [birth year] (84 y.o. F) Treating RN: Renne Crigler Primary Care Chivonne Rascon: Aram Beecham Other Clinician: Referring Beulah Capobianco: Aram Beecham Treating Kalyssa Anker/Extender: Linwood Dibbles, HOYT Weeks in Treatment: 23 Visit Information History Since Last Visit All ordered tests and consults were completed: No Patient Arrived: Dan Humphreys Added or deleted any medications: No Arrival Time: 13:41 Any new allergies or adverse reactions: No Accompanied By: self Had a fall or experienced change in No Transfer Assistance: None activities of daily living that may affect Patient Identification Verified: Yes risk of falls: Secondary Verification Process Yes Signs or symptoms of abuse/neglect since last visito No Completed: Hospitalized since last visit: No Patient Requires Transmission-Based No Implantable device outside of the clinic excluding No Precautions: cellular tissue based products placed in the center Patient Has Alerts: Yes since last visit: Patient Alerts: non compressible left Pain Present Now: No leg Electronic Signature(s) Signed: 04/27/2018 4:25:58 PM By: Renne Crigler Entered By: Renne Crigler on 04/27/2018 14:03:01 [name] ([id number]) -------------------------------------------------------------------------------- Encounter Discharge Information Details Patient Name: [name] Date of Service: 04/27/2018 1:30 PM Medical Record Number: [id number] Patient Account Number: [id number] Date of Birth/Sex: 1930-08-27 (84 y.o. F) Treating RN: Phillis Haggis Primary Care Mikeisha Lemonds: Aram Beecham Other Clinician: Referring Torryn Hudspeth: Aram Beecham Treating Kierstynn Babich/Extender: Linwood Dibbles, HOYT Weeks in Treatment: 23 Encounter  Discharge Information Items Discharge Condition: Stable Ambulatory Status: Walker Discharge Destination: Home Transportation: Private Auto Accompanied By: caregiver Schedule Follow-up Appointment: Yes Clinical Summary of Care: Electronic Signature(s) Signed: 04/27/2018 2:50:32 PM By: Alejandro Mulling Entered By: Alejandro Mulling on 04/27/2018 14:50:31 [name] ([id number]) -------------------------------------------------------------------------------- Lower Extremity Assessment Details Patient Name: [name] Date of Service: 04/27/2018 1:30 PM Medical Record Number: [id number] Patient Account Number: [id number] Date of Birth/Sex: [birth year] (84 y.o. F) Treating RN: Renne Crigler Primary Care Kita Neace: Aram Beecham Other Clinician: Referring Jarriel Papillion: Aram Beecham Treating Dorea Duff/Extender: Linwood Dibbles, HOYT Weeks in Treatment: 23 Edema Assessment Assessed: [Left: No] [Right: No] Edema: [Left: N] [Right: o] Vascular Assessment Claudication: Claudication Assessment [Left:None] Pulses: Dorsalis Pedis Palpable: [Left:Yes] Doppler Audible: [Left:Yes] Posterior Tibial Extremity colors, hair growth, and conditions: Extremity Color: [Left:Hyperpigmented] Hair Growth on Extremity: [Left:No] Temperature of Extremity: [Left:Cool] Capillary Refill: [Left:> 3 seconds] Toe Nail Assessment Left: Right: Thick: Yes Discolored: Yes Deformed: No Improper Length and Hygiene: No Electronic Signature(s) Signed: 04/27/2018 4:25:58 PM By: Renne Crigler Entered By: Renne Crigler on 04/27/2018 14:03:47 [name] ([id number]) -------------------------------------------------------------------------------- Multi Wound Chart Details Patient Name: [name] Date of Service: 04/27/2018 1:30 PM Medical Record Number: [id number] Patient Account Number: [id number] Date of Birth/Sex: 1930-03-26 (84 y.o. F) Treating RN: Curtis Sites Primary Care Keirstan Iannello:  Aram Beecham Other Clinician: Referring Meril Dray: Aram Beecham Treating Anely Spiewak/Extender: Linwood Dibbles, HOYT Weeks in Treatment: 23 Vital Signs Height(in): 60 Pulse(bpm): 84 Weight(lbs): 123 Blood Pressure(mmHg): 111/57 Body Mass Index(BMI): 24 Temperature(F): 98.4 Respiratory Rate 16 (breaths/min): Photos: [7:No Photos] [8:No Photos] [9:No Photos] Wound Location: [7:Left Lower Leg - Proximal] [8:Left Lower Leg - Distal] [9:Left Elbow] Wounding Event: [7:Trauma] [8:Trauma] [9:Trauma] Primary Etiology: [7:Trauma, Other] [8:Trauma, Other] [9:Trauma, Other] Comorbid History: [7:Hypertension, Gout, Osteoarthritis, Neuropathy] [8:Hypertension, Gout, Osteoarthritis, Neuropathy] [9:Hypertension, Gout, Osteoarthritis, Neuropathy] Date Acquired: [7:03/22/2018] [8:03/22/2018] [9:01/26/2018] Weeks of Treatment: [7:4] [8:4] [9:0] Wound Status: [7:Open] [8:Open] [9:Open] Measurements L x W x D [7:0.8x1x0.1] [8:1.4x0.6x0.1] [9:0.3x0.3x0.2] (cm) Area (cm) : [7:0.628] [8:0.66] [  9:0.071] Volume (cm) : [7:0.063] [8:0.066] [9:0.014] % Reduction in Area: [7:0.00%] [8:47.50%] [9:0.00%] % Reduction in Volume: [7:50.00%] [8:47.60%] [9:0.00%] Classification: [7:Full Thickness Without Exposed Support Structures] [8:Partial Thickness] [9:Full Thickness Without Exposed Support Structures] Exudate Amount: [7:Large] [8:Large] [9:Large] Exudate Type: [7:Serous] [8:Serous] [9:Serous] Exudate Color: [7:amber] [8:amber] [9:amber] Wound Margin: [7:Distinct, outline attached] [8:Distinct, outline attached] [9:Distinct, outline attached] Granulation Amount: [7:Medium (34-66%)] [8:Medium (34-66%)] [9:Small (1-33%)] Granulation Quality: [7:Red] [8:Red, Hyper-granulation] [9:Pink] Necrotic Amount: [7:Medium (34-66%)] [8:Medium (34-66%)] [9:Large (67-100%)] Exposed Structures: [7:Fat Layer (Subcutaneous Tissue) Exposed: Yes Fascia: No Tendon: No Muscle: No Joint: No Bone: No] [8:Fat Layer (Subcutaneous Tissue)  Exposed: Yes Fascia: No Tendon: No Muscle: No Joint: No Bone: No] [9:Fascia: No Fat Layer (Subcutaneous Tissue)  Exposed: No Tendon: No Muscle: No Joint: No Bone: No] Epithelialization: [7:None] [8:N/A] [9:None] Periwound Skin Texture: [7:Excoriation: No Induration: No Callus: No Crepitus: No] [8:Excoriation: No Induration: No Callus: No Crepitus: No] [9:Excoriation: Yes Induration: No Callus: No Crepitus: No] Rash: No Rash: No Rash: No Scarring: No Scarring: No Scarring: No Periwound Skin Moisture: Maceration: No Maceration: No Maceration: Yes Dry/Scaly: No Dry/Scaly: No Dry/Scaly: No Periwound Skin Color: Atrophie Blanche: No Atrophie Blanche: No Atrophie Blanche: No Cyanosis: No Cyanosis: No Cyanosis: No Ecchymosis: No Ecchymosis: No Ecchymosis: No Erythema: No Erythema: No Erythema: No Hemosiderin Staining: No Hemosiderin Staining: No Hemosiderin Staining: No Mottled: No Mottled: No Mottled: No Pallor: No Pallor: No Pallor: No Rubor: No Rubor: No Rubor: No Temperature: N/A No Abnormality No Abnormality Tenderness on Palpation: No Yes Yes Wound Preparation: Ulcer Cleansing: Ulcer Cleansing: Ulcer Cleansing: Rinsed/Irrigated with Saline Rinsed/Irrigated with Saline Rinsed/Irrigated with Saline Topical Anesthetic Applied: Topical Anesthetic Applied: Topical Anesthetic Applied: Other: lidocaine 4% Other: lidocaine 4% Other: lidocaine 4% Treatment Notes Electronic Signature(s) Signed: 04/27/2018 4:56:04 PM By: Curtis Sites Entered By: Curtis Sites on 04/27/2018 14:17:56 [name] ([id number]) -------------------------------------------------------------------------------- Multi-Disciplinary Care Plan Details Patient Name: [name] Date of Service: 04/27/2018 1:30 PM Medical Record Number: [id number] Patient Account Number: [id number] Date of Birth/Sex: Jan 24, 1930 (84 y.o. F) Treating RN: Curtis Sites Primary Care Kathan Kirker: Aram Beecham Other Clinician: Referring Armond Cuthrell: Aram Beecham Treating Kimiyah Blick/Extender: Linwood Dibbles, HOYT Weeks in Treatment: 22 Active Inactive ` Orientation to the Wound Care Program Nursing Diagnoses: Knowledge deficit related to the wound healing center program Goals: Patient/caregiver will verbalize understanding of the Wound Healing Center Program Date Initiated: 11/14/2017 Target Resolution Date: 11/28/2017 Goal Status: Active Interventions: Provide education on orientation to the wound center Notes: ` Wound/Skin Impairment Nursing Diagnoses: Impaired tissue integrity Knowledge deficit related to ulceration/compromised skin integrity Goals: Patient/caregiver will verbalize understanding of skin care regimen Date Initiated: 11/14/2017 Target Resolution Date: 11/28/2017 Goal Status: Active Ulcer/skin breakdown will have a volume reduction of 30% by week 4 Date Initiated: 11/14/2017 Target Resolution Date: 11/28/2017 Goal Status: Active Interventions: Assess patient/caregiver ability to obtain necessary supplies Assess patient/caregiver ability to perform ulcer/skin care regimen upon admission and as needed Assess ulceration(s) every visit Treatment Activities: Skin care regimen initiated : 11/14/2017 Notes: Electronic Signature(s) Signed: 04/27/2018 4:56:04 PM By: Estrella Myrtle, Lawernce Keas ([id number]) Entered By: Curtis Sites on 04/27/2018 14:17:45 [name] ([id number]) -------------------------------------------------------------------------------- Pain Assessment Details Patient Name: [name] Date of Service: 04/27/2018 1:30 PM Medical Record Number: [id number] Patient Account Number: [id number] Date of Birth/Sex: 1930/10/19 (84 y.o. F) Treating RN: Renne Crigler Primary Care [name] Crumby: Aram Beecham Other Clinician: Referring Hubert Derstine: Aram Beecham Treating Kalea Perine/Extender: Linwood Dibbles, HOYT Weeks in Treatment: 33 Active  Problems Location of  Pain Severity and Description of Pain Patient Has Paino No Site Locations Pain Management and Medication Current Pain Management: Electronic Signature(s) Signed: 04/27/2018 4:25:58 PM By: Renne Crigler Entered By: Renne Crigler on 04/27/2018 13:47:32 [name] ([id number]) -------------------------------------------------------------------------------- Patient/Caregiver Education Details Patient Name: [name] Date of Service: 04/27/2018 1:30 PM Medical Record Number: [id number] Patient Account Number: [id number] Date of Birth/Gender: [birth year] (84 y.o. F) Treating RN: Phillis Haggis Primary Care Physician: Aram Beecham Other Clinician: Referring Physician: Aram Beecham Treating Physician/Extender: Skeet Simmer in Treatment: 24 Education Assessment Education Provided To: Patient and Caregiver caregiver Education Topics Provided Wound/Skin Impairment: Handouts: Caring for Your Ulcer, Other: change dressing as ordered Methods: Demonstration, Explain/Verbal Responses: State content correctly Electronic Signature(s) Signed: 04/27/2018 4:38:26 PM By: Alejandro Mulling Entered By: Alejandro Mulling on 04/27/2018 14:50:53 [name] ([id number]) -------------------------------------------------------------------------------- Wound Assessment Details Patient Name: [name] Date of Service: 04/27/2018 1:30 PM Medical Record Number: [id number] Patient Account Number: [id number] Date of Birth/Sex: 09-Jan-1930 (84 y.o. F) Treating RN: Renne Crigler Primary Care Yolunda Kloos: Aram Beecham Other Clinician: Referring Tatiyana Foucher: Aram Beecham Treating Brayan Votaw/Extender: Linwood Dibbles, HOYT Weeks in Treatment: 23 Wound Status Wound Number: 7 Primary Etiology: Trauma, Other Wound Location: Left Lower Leg - Proximal Wound Status: Open Wounding Event: Trauma Comorbid Hypertension, Gout, Osteoarthritis, History: Neuropathy Date  Acquired: 03/22/2018 Weeks Of Treatment: 4 Clustered Wound: No Photos Photo Uploaded By: Renne Crigler on 04/27/2018 16:28:45 Wound Measurements Length: (cm) 0.8 Width: (cm) 1 Depth: (cm) 0.1 Area: (cm) 0.628 Volume: (cm) 0.063 % Reduction in Area: 0% % Reduction in Volume: 50% Epithelialization: None Tunneling: No Undermining: No Wound Description Full Thickness Without Exposed Support Foul Classification: Structures Slou Wound Margin: Distinct, outline attached Exudate Large Amount: Exudate Type: Serous Exudate Color: amber Odor After Cleansing: No gh/Fibrino Yes Wound Bed Granulation Amount: Medium (34-66%) Exposed Structure Granulation Quality: Red Fascia Exposed: No Necrotic Amount: Medium (34-66%) Fat Layer (Subcutaneous Tissue) Exposed: Yes Tendon Exposed: No Muscle Exposed: No Joint Exposed: No Bone Exposed: No FANTA, WIMBERLEY V. ([id number]) Periwound Skin Texture Texture Color No Abnormalities Noted: No No Abnormalities Noted: No Callus: No Atrophie Blanche: No Crepitus: No Cyanosis: No Excoriation: No Ecchymosis: No Induration: No Erythema: No Rash: No Hemosiderin Staining: No Scarring: No Mottled: No Pallor: No Moisture Rubor: No No Abnormalities Noted: No Dry / Scaly: No Maceration: No Wound Preparation Ulcer Cleansing: Rinsed/Irrigated with Saline Topical Anesthetic Applied: Other: lidocaine 4%, Treatment Notes Wound #7 (Left, Proximal Lower Leg) 1. Cleansed with: Clean wound with Normal Saline 2. Anesthetic Topical Lidocaine 4% cream to wound bed prior to debridement 3. Peri-wound Care: Skin Prep 4. Dressing Applied: Prisma Ag 5. Secondary Dressing Applied Bordered Foam Dressing Electronic Signature(s) Signed: 04/27/2018 4:25:58 PM By: Renne Crigler Entered By: Renne Crigler on 04/27/2018 14:07:23 [name] ([id number]) -------------------------------------------------------------------------------- Wound  Assessment Details Patient Name: [name] Date of Service: 04/27/2018 1:30 PM Medical Record Number: [id number] Patient Account Number: [id number] Date of Birth/Sex: 15-Jan-1930 (84 y.o. F) Treating RN: Renne Crigler Primary Care Arfa Lamarca: Aram Beecham Other Clinician: Referring Scout Guyett: Aram Beecham Treating Safiyyah Vasconez/Extender: Linwood Dibbles, HOYT Weeks in Treatment: 23 Wound Status Wound Number: 8 Primary Etiology: Trauma, Other Wound Location: Left Lower Leg - Distal Wound Status: Open Wounding Event: Trauma Comorbid Hypertension, Gout, Osteoarthritis, History: Neuropathy Date Acquired: 03/22/2018 Weeks Of Treatment: 4 Clustered Wound: No Photos Photo Uploaded By: Renne Crigler on 04/27/2018 16:28:46 Wound Measurements Length: (cm) 1.4 Width: (cm) 0.6 Depth: (cm) 0.1 Area: (cm) 0.66 Volume: (cm)  0.066 % Reduction in Area: 47.5% % Reduction in Volume: 47.6% Tunneling: No Undermining: No Wound Description Classification: Partial Thickness Foul Wound Margin: Distinct, outline attached Slou Exudate Amount: Large Exudate Type: Serous Exudate Color: amber Odor After Cleansing: No gh/Fibrino Yes Wound Bed Granulation Amount: Medium (34-66%) Exposed Structure Granulation Quality: Red, Hyper-granulation Fascia Exposed: No Necrotic Amount: Medium (34-66%) Fat Layer (Subcutaneous Tissue) Exposed: Yes Necrotic Quality: Adherent Slough Tendon Exposed: No Muscle Exposed: No Joint Exposed: No Bone Exposed: No Periwound Skin Texture MARIACRISTINA, ADAY V. ([id number]) Texture Color No Abnormalities Noted: No No Abnormalities Noted: No Callus: No Atrophie Blanche: No Crepitus: No Cyanosis: No Excoriation: No Ecchymosis: No Induration: No Erythema: No Rash: No Hemosiderin Staining: No Scarring: No Mottled: No Pallor: No Moisture Rubor: No No Abnormalities Noted: No Dry / Scaly: No Temperature / Pain Maceration: No Temperature: No  Abnormality Tenderness on Palpation: Yes Wound Preparation Ulcer Cleansing: Rinsed/Irrigated with Saline Topical Anesthetic Applied: Other: lidocaine 4%, Treatment Notes Wound #8 (Left, Distal Lower Leg) 1. Cleansed with: Clean wound with Normal Saline 2. Anesthetic Topical Lidocaine 4% cream to wound bed prior to debridement 3. Peri-wound Care: Skin Prep 4. Dressing Applied: Prisma Ag 5. Secondary Dressing Applied Bordered Foam Dressing Electronic Signature(s) Signed: 04/27/2018 4:25:58 PM By: Renne Crigler Entered By: Renne Crigler on 04/27/2018 14:05:55 [name] ([id number]) -------------------------------------------------------------------------------- Wound Assessment Details Patient Name: [name] Date of Service: 04/27/2018 1:30 PM Medical Record Number: [id number] Patient Account Number: [id number] Date of Birth/Sex: 1930-04-15 (84 y.o. F) Treating RN: Renne Crigler Primary Care Darlean Warmoth: Aram Beecham Other Clinician: Referring Brown Dunlap: Aram Beecham Treating Rondia Higginbotham/Extender: Linwood Dibbles, HOYT Weeks in Treatment: 23 Wound Status Wound Number: 9 Primary Etiology: Trauma, Other Wound Location: Left Elbow Wound Status: Open Wounding Event: Trauma Comorbid Hypertension, Gout, Osteoarthritis, History: Neuropathy Date Acquired: 01/26/2018 Weeks Of Treatment: 0 Clustered Wound: No Photos Photo Uploaded By: Renne Crigler on 04/27/2018 16:29:09 Wound Measurements Length: (cm) 0.3 Width: (cm) 0.3 Depth: (cm) 0.2 Area: (cm) 0.071 Volume: (cm) 0.014 % Reduction in Area: 0% % Reduction in Volume: 0% Epithelialization: None Tunneling: No Undermining: No Wound Description Full Thickness Without Exposed Support Foul Classification: Structures Slou Wound Margin: Distinct, outline attached Exudate Large Amount: Exudate Type: Serous Exudate Color: amber Odor After Cleansing: No gh/Fibrino Yes Wound Bed Granulation Amount:  Small (1-33%) Exposed Structure Granulation Quality: Pink Fascia Exposed: No Necrotic Amount: Large (67-100%) Fat Layer (Subcutaneous Tissue) Exposed: No Necrotic Quality: Adherent Slough Tendon Exposed: No Muscle Exposed: No Joint Exposed: No Bone Exposed: No REHAM, SLABAUGH V. ([id number]) Periwound Skin Texture Texture Color No Abnormalities Noted: No No Abnormalities Noted: No Callus: No Atrophie Blanche: No Crepitus: No Cyanosis: No Excoriation: Yes Ecchymosis: No Induration: No Erythema: No Rash: No Hemosiderin Staining: No Scarring: No Mottled: No Pallor: No Moisture Rubor: No No Abnormalities Noted: No Dry / Scaly: No Temperature / Pain Maceration: Yes Temperature: No Abnormality Tenderness on Palpation: Yes Wound Preparation Ulcer Cleansing: Rinsed/Irrigated with Saline Topical Anesthetic Applied: Other: lidocaine 4%, Treatment Notes Wound #9 (Left Elbow) 1. Cleansed with: Clean wound with Normal Saline 2. Anesthetic Topical Lidocaine 4% cream to wound bed prior to debridement 3. Peri-wound Care: Skin Prep 4. Dressing Applied: Prisma Ag 5. Secondary Dressing Applied Bordered Foam Dressing Electronic Signature(s) Signed: 04/27/2018 4:25:58 PM By: Renne Crigler Entered By: Renne Crigler on 04/27/2018 14:04:22 [name] ([id number]) -------------------------------------------------------------------------------- Vitals Details Patient Name: [name] Date of Service: 04/27/2018 1:30 PM Medical Record Number: [id number] Patient Account Number: [id number] Date  of Birth/Sex: [birth year] (84 y.o. F) Treating RN: Renne Crigler Primary Care Anil Havard: Aram Beecham Other Clinician: Referring Exavier Lina: Aram Beecham Treating Haroon Shatto/Extender: Linwood Dibbles, HOYT Weeks in Treatment: 23 Vital Signs Time Taken: 13:48 Temperature (F): 98.4 Height (in): 60 Pulse (bpm): 84 Weight (lbs): 123 Respiratory Rate (breaths/min): 16 Body Mass  Index (BMI): 24 Blood Pressure (mmHg): 111/57 Reference Range: 80 - 120 mg / dl Electronic Signature(s) Signed: 04/27/2018 4:25:58 PM By: Renne Crigler Entered By: Renne Crigler on 04/27/2018 13:50:42

## 2018-05-01 ENCOUNTER — Encounter: Payer: Medicare Other | Admitting: Physical Therapy

## 2018-05-04 ENCOUNTER — Encounter: Payer: Medicare Other | Admitting: Physician Assistant

## 2018-05-04 DIAGNOSIS — L97822 Non-pressure chronic ulcer of other part of left lower leg with fat layer exposed: Secondary | ICD-10-CM | POA: Diagnosis not present

## 2018-05-06 NOTE — Progress Notes (Signed)
[name] ([id number]) Visit Report for 05/04/2018 Arrival Information Details Patient Name: [name], [name]. Date of Service: 05/04/2018 2:15 PM Medical Record Number: [id number] Patient Account Number: [id number] Date of Birth/Sex: 1930/08/26 (84 y.o. F) Treating RN: Renne Crigler Primary Care Yehya Brendle: Aram Beecham Other Clinician: Referring Zakia Sainato: Aram Beecham Treating Oluwateniola Leitch/Extender: Linwood Dibbles, HOYT Weeks in Treatment: 24 Visit Information History Since Last Visit All ordered tests and consults were completed: No Patient Arrived: Dan Humphreys Added or deleted any medications: No Arrival Time: 14:42 Any new allergies or adverse reactions: No Accompanied By: self Had a fall or experienced change in No Transfer Assistance: None activities of daily living that may affect Patient Identification Verified: Yes risk of falls: Secondary Verification Process Yes Signs or symptoms of abuse/neglect since last visito No Completed: Hospitalized since last visit: No Patient Requires Transmission-Based No Implantable device outside of the clinic excluding No Precautions: cellular tissue based products placed in the center Patient Has Alerts: Yes since last visit: Patient Alerts: non compressible left Pain Present Now: No leg Electronic Signature(s) Signed: 05/04/2018 4:56:16 PM By: Renne Crigler Entered By: Renne Crigler on 05/04/2018 14:43:12 [name] ([id number]) -------------------------------------------------------------------------------- Lower Extremity Assessment Details Patient Name: [name] Date of Service: 05/04/2018 2:15 PM Medical Record Number: [id number] Patient Account Number: [id number] Date of Birth/Sex: 1929/12/29 (84 y.o. F) Treating RN: Renne Crigler Primary Care Jamilette Suchocki: Aram Beecham Other Clinician: Referring Ladonte Verstraete: Aram Beecham Treating Santhosh Gulino/Extender: Linwood Dibbles, HOYT Weeks in Treatment: 24 Edema  Assessment Assessed: [Left: No] [Right: No] Edema: [Left: N] [Right: o] Vascular Assessment Pulses: Dorsalis Pedis Palpable: [Left:Yes] Posterior Tibial Extremity colors, hair growth, and conditions: Hair Growth on Extremity: [Left:No] Temperature of Extremity: [Left:Warm] Capillary Refill: [Left:< 3 seconds] Toe Nail Assessment Left: Right: Thick: No Discolored: No Deformed: No Improper Length and Hygiene: No Electronic Signature(s) Signed: 05/04/2018 4:56:16 PM By: Renne Crigler Entered By: Renne Crigler on 05/04/2018 15:00:41 [name] ([id number]) -------------------------------------------------------------------------------- Multi Wound Chart Details Patient Name: [name] Date of Service: 05/04/2018 2:15 PM Medical Record Number: [id number] Patient Account Number: [id number] Date of Birth/Sex: [birth year] (84 y.o. F) Treating RN: Curtis Sites Primary Care Doral Ventrella: Aram Beecham Other Clinician: Referring Humphrey Guerreiro: Aram Beecham Treating Azelyn Batie/Extender: Linwood Dibbles, HOYT Weeks in Treatment: 24 Vital Signs Height(in): 60 Pulse(bpm): 82 Weight(lbs): 123 Blood Pressure(mmHg): 119/47 Body Mass Index(BMI): 24 Temperature(F): 98.1 Respiratory Rate 16 (breaths/min): Photos: [7:No Photos] [8:No Photos] [9:No Photos] Wound Location: [7:Left Lower Leg - Proximal] [8:Left Lower Leg - Distal] [9:Left Elbow] Wounding Event: [7:Trauma] [8:Trauma] [9:Trauma] Primary Etiology: [7:Trauma, Other] [8:Trauma, Other] [9:Trauma, Other] Comorbid History: [7:Hypertension, Gout, Osteoarthritis, Neuropathy] [8:Hypertension, Gout, Osteoarthritis, Neuropathy] [9:Hypertension, Gout, Osteoarthritis, Neuropathy] Date Acquired: [7:03/22/2018] [8:03/22/2018] [9:01/26/2018] Weeks of Treatment: [7:5] [8:5] [9:1] Wound Status: [7:Open] [8:Open] [9:Open] Measurements L x W x D [7:0.5x0.3x0.1] [8:1x1.3x0.1] [9:0.5x0.5x0.3] (cm) Area (cm) : [7:0.118] [8:1.021]  [9:0.196] Volume (cm) : [7:0.012] [8:0.102] [9:0.059] % Reduction in Area: [7:81.20%] [8:18.80%] [9:-176.10%] % Reduction in Volume: [7:90.50%] [8:19.00%] [9:-321.40%] Starting Position 1 [9:11] (o'clock): Ending Position 1 [9:2] (o'clock): Maximum Distance 1 (cm): [9:0.3] Undermining: [7:No] [8:No] [9:Yes] Classification: [7:Full Thickness Without Exposed Support Structures] [8:Partial Thickness] [9:Full Thickness Without Exposed Support Structures] Exudate Amount: [7:Medium] [8:Large] [9:Large] Exudate Type: [7:Serous] [8:Serous] [9:Serous] Exudate Color: [7:amber] [8:amber] [9:amber] Wound Margin: [7:Distinct, outline attached] [8:Distinct, outline attached] [9:Distinct, outline attached] Granulation Amount: [7:Large (67-100%)] [8:Large (67-100%)] [9:Medium (34-66%)] Granulation Quality: [7:Red] [8:Red, Hyper-granulation] [9:Pink] Necrotic Amount: [7:Small (1-33%)] [8:Small (1-33%)] [9:Medium (34-66%)] Exposed Structures: [7:Fat Layer (Subcutaneous Tissue) Exposed: Yes Fascia:  No Tendon: No Muscle: No Joint: No Bone: No] [8:Fat Layer (Subcutaneous Tissue) Exposed: Yes Fascia: No Tendon: No Muscle: No Joint: No Bone: No] [9:Fat Layer (Subcutaneous Tissue) Exposed: Yes  Fascia: No Tendon: No Muscle: No Joint: No Bone: No] Epithelialization: None None None Periwound Skin Texture: Excoriation: No Excoriation: No Excoriation: No Induration: No Induration: No Induration: No Callus: No Callus: No Callus: No Crepitus: No Crepitus: No Crepitus: No Rash: No Rash: No Rash: No Scarring: No Scarring: No Scarring: No Periwound Skin Moisture: Maceration: No Maceration: No Maceration: No Dry/Scaly: No Dry/Scaly: No Dry/Scaly: No Periwound Skin Color: Atrophie Blanche: No Atrophie Blanche: No Atrophie Blanche: No Cyanosis: No Cyanosis: No Cyanosis: No Ecchymosis: No Ecchymosis: No Ecchymosis: No Erythema: No Erythema: No Erythema: No Hemosiderin Staining:  No Hemosiderin Staining: No Hemosiderin Staining: No Mottled: No Mottled: No Mottled: No Pallor: No Pallor: No Pallor: No Rubor: No Rubor: No Rubor: No Temperature: No Abnormality No Abnormality No Abnormality Tenderness on Palpation: No Yes Yes Wound Preparation: Ulcer Cleansing: Ulcer Cleansing: Ulcer Cleansing: Rinsed/Irrigated with Saline Rinsed/Irrigated with Saline Rinsed/Irrigated with Saline Topical Anesthetic Applied: Topical Anesthetic Applied: Topical Anesthetic Applied: Other: lidocaine 4% Other: lidocaine 4% Other: lidocaine 4% Treatment Notes Electronic Signature(s) Signed: 05/04/2018 5:24:01 PM By: Curtis Sites Entered By: Curtis Sites on 05/04/2018 15:23:00 [name] ([id number]) -------------------------------------------------------------------------------- Multi-Disciplinary Care Plan Details Patient Name: [name] Date of Service: 05/04/2018 2:15 PM Medical Record Number: [id number] Patient Account Number: [id number] Date of Birth/Sex: 11-10-1930 (84 y.o. F) Treating RN: Curtis Sites Primary Care Euell Schiff: Aram Beecham Other Clinician: Referring Aara Jacquot: Aram Beecham Treating Azeem Poorman/Extender: Linwood Dibbles, HOYT Weeks in Treatment: 24 Active Inactive ` Orientation to the Wound Care Program Nursing Diagnoses: Knowledge deficit related to the wound healing center program Goals: Patient/caregiver will verbalize understanding of the Wound Healing Center Program Date Initiated: 11/14/2017 Target Resolution Date: 11/28/2017 Goal Status: Active Interventions: Provide education on orientation to the wound center Notes: ` Wound/Skin Impairment Nursing Diagnoses: Impaired tissue integrity Knowledge deficit related to ulceration/compromised skin integrity Goals: Patient/caregiver will verbalize understanding of skin care regimen Date Initiated: 11/14/2017 Target Resolution Date: 11/28/2017 Goal Status: Active Ulcer/skin  breakdown will have a volume reduction of 30% by week 4 Date Initiated: 11/14/2017 Target Resolution Date: 11/28/2017 Goal Status: Active Interventions: Assess patient/caregiver ability to obtain necessary supplies Assess patient/caregiver ability to perform ulcer/skin care regimen upon admission and as needed Assess ulceration(s) every visit Treatment Activities: Skin care regimen initiated : 11/14/2017 Notes: Electronic Signature(s) Signed: 05/04/2018 5:24:01 PM By: Estrella Myrtle, Lawernce Keas ([id number]) Entered By: Curtis Sites on 05/04/2018 15:22:49 [name] ([id number]) -------------------------------------------------------------------------------- Pain Assessment Details Patient Name: [name] Date of Service: 05/04/2018 2:15 PM Medical Record Number: [id number] Patient Account Number: [id number] Date of Birth/Sex: 03-27-1930 (84 y.o. F) Treating RN: Renne Crigler Primary Care Alazne Quant: Aram Beecham Other Clinician: Referring Jinny Sweetland: Aram Beecham Treating Jesica Goheen/Extender: Linwood Dibbles, HOYT Weeks in Treatment: 24 Active Problems Location of Pain Severity and Description of Pain Patient Has Paino No Site Locations Pain Management and Medication Current Pain Management: Electronic Signature(s) Signed: 05/04/2018 4:56:16 PM By: Renne Crigler Entered By: Renne Crigler on 05/04/2018 14:43:20 [name] ([id number]) -------------------------------------------------------------------------------- Wound Assessment Details Patient Name: [name] Date of Service: 05/04/2018 2:15 PM Medical Record Number: [id number] Patient Account Number: [id number] Date of Birth/Sex: Feb 10, 1930 (84 y.o. F) Treating RN: Renne Crigler Primary Care Cedarius Kersh: Aram Beecham Other Clinician: Referring Dandy Lazaro: Aram Beecham Treating Kanitra Purifoy/Extender: Linwood Dibbles, HOYT Weeks in Treatment: 85  Wound Status Wound Number: 7 Primary Etiology: Trauma,  Other Wound Location: Left Lower Leg - Proximal Wound Status: Open Wounding Event: Trauma Comorbid Hypertension, Gout, Osteoarthritis, History: Neuropathy Date Acquired: 03/22/2018 Weeks Of Treatment: 5 Clustered Wound: No Photos Photo Uploaded By: Renne Crigler on 05/04/2018 17:09:32 Wound Measurements Length: (cm) 0.5 Width: (cm) 0.3 Depth: (cm) 0.1 Area: (cm) 0.118 Volume: (cm) 0.012 % Reduction in Area: 81.2% % Reduction in Volume: 90.5% Epithelialization: None Tunneling: No Undermining: No Wound Description Full Thickness Without Exposed Support Foul Classification: Structures Slou Wound Margin: Distinct, outline attached Exudate Medium Amount: Exudate Type: Serous Exudate Color: amber Odor After Cleansing: No gh/Fibrino Yes Wound Bed Granulation Amount: Large (67-100%) Exposed Structure Granulation Quality: Red Fascia Exposed: No Necrotic Amount: Small (1-33%) Fat Layer (Subcutaneous Tissue) Exposed: Yes Necrotic Quality: Adherent Slough Tendon Exposed: No Muscle Exposed: No Joint Exposed: No Bone Exposed: No CORLISS, COGGESHALL V. ([id number]) Periwound Skin Texture Texture Color No Abnormalities Noted: No No Abnormalities Noted: No Callus: No Atrophie Blanche: No Crepitus: No Cyanosis: No Excoriation: No Ecchymosis: No Induration: No Erythema: No Rash: No Hemosiderin Staining: No Scarring: No Mottled: No Pallor: No Moisture Rubor: No No Abnormalities Noted: No Dry / Scaly: No Temperature / Pain Maceration: No Temperature: No Abnormality Wound Preparation Ulcer Cleansing: Rinsed/Irrigated with Saline Topical Anesthetic Applied: Other: lidocaine 4%, Electronic Signature(s) Signed: 05/04/2018 4:56:16 PM By: Renne Crigler Entered By: Renne Crigler on 05/04/2018 14:57:21 [name] ([id number]) -------------------------------------------------------------------------------- Wound Assessment Details Patient Name: [name] Date of Service: 05/04/2018 2:15 PM Medical Record Number: [id number] Patient Account Number: [id number] Date of Birth/Sex: 01-Mar-1930 (84 y.o. F) Treating RN: Renne Crigler Primary Care Barb Shear: Aram Beecham Other Clinician: Referring Marq Rebello: Aram Beecham Treating Andreina Outten/Extender: Linwood Dibbles, HOYT Weeks in Treatment: 24 Wound Status Wound Number: 8 Primary Etiology: Trauma, Other Wound Location: Left Lower Leg - Distal Wound Status: Open Wounding Event: Trauma Comorbid Hypertension, Gout, Osteoarthritis, History: Neuropathy Date Acquired: 03/22/2018 Weeks Of Treatment: 5 Clustered Wound: No Photos Photo Uploaded By: Renne Crigler on 05/04/2018 17:09:32 Wound Measurements Length: (cm) 1 Width: (cm) 1.3 Depth: (cm) 0.1 Area: (cm) 1.021 Volume: (cm) 0.102 % Reduction in Area: 18.8% % Reduction in Volume: 19% Epithelialization: None Tunneling: No Undermining: No Wound Description Classification: Partial Thickness Foul Wound Margin: Distinct, outline attached Slou Exudate Amount: Large Exudate Type: Serous Exudate Color: amber Odor After Cleansing: No gh/Fibrino Yes Wound Bed Granulation Amount: Large (67-100%) Exposed Structure Granulation Quality: Red, Hyper-granulation Fascia Exposed: No Necrotic Amount: Small (1-33%) Fat Layer (Subcutaneous Tissue) Exposed: Yes Necrotic Quality: Adherent Slough Tendon Exposed: No Muscle Exposed: No Joint Exposed: No Bone Exposed: No Periwound Skin Texture LIZANNE, ERKER V. ([id number]) Texture Color No Abnormalities Noted: No No Abnormalities Noted: No Callus: No Atrophie Blanche: No Crepitus: No Cyanosis: No Excoriation: No Ecchymosis: No Induration: No Erythema: No Rash: No Hemosiderin Staining: No Scarring: No Mottled: No Pallor: No Moisture Rubor: No No Abnormalities Noted: No Dry / Scaly: No Temperature / Pain Maceration: No Temperature: No Abnormality Tenderness on Palpation: Yes Wound  Preparation Ulcer Cleansing: Rinsed/Irrigated with Saline Topical Anesthetic Applied: Other: lidocaine 4%, Electronic Signature(s) Signed: 05/04/2018 4:56:16 PM By: Renne Crigler Entered By: Renne Crigler on 05/04/2018 14:59:13 [name] ([id number]) -------------------------------------------------------------------------------- Wound Assessment Details Patient Name: [name] Date of Service: 05/04/2018 2:15 PM Medical Record Number: [id number] Patient Account Number: [id number] Date of Birth/Sex: 11/05/1930 (84 y.o. F) Treating RN: Renne Crigler Primary Care Naydeline Morace: Aram Beecham Other Clinician: Referring Aribelle Mccosh: Aram Beecham Treating  Ivyanna Sibert/Extender: Linwood Dibbles, HOYT Weeks in Treatment: 24 Wound Status Wound Number: 9 Primary Etiology: Trauma, Other Wound Location: Left Elbow Wound Status: Open Wounding Event: Trauma Comorbid Hypertension, Gout, Osteoarthritis, History: Neuropathy Date Acquired: 01/26/2018 Weeks Of Treatment: 1 Clustered Wound: No Photos Photo Uploaded By: Renne Crigler on 05/04/2018 17:10:03 Wound Measurements Length: (cm) 0.5 Width: (cm) 0.5 Depth: (cm) 0.3 Area: (cm) 0.196 Volume: (cm) 0.059 % Reduction in Area: -176.1% % Reduction in Volume: -321.4% Epithelialization: None Tunneling: No Undermining: Yes Starting Position (o'clock): 11 Ending Position (o'clock): 2 Maximum Distance: (cm) 0.3 Wound Description Full Thickness Without Exposed Support Foul Classification: Structures Slou Wound Margin: Distinct, outline attached Exudate Large Amount: Exudate Type: Serous Exudate Color: amber Odor After Cleansing: No gh/Fibrino Yes Wound Bed Granulation Amount: Medium (34-66%) Exposed Structure Granulation Quality: Pink Fascia Exposed: No Necrotic Amount: Medium (34-66%) Fat Layer (Subcutaneous Tissue) Exposed: Yes ELARIA, SUDDETH ([id number]) Necrotic Quality: Adherent Slough Tendon Exposed:  No Muscle Exposed: No Joint Exposed: No Bone Exposed: No Periwound Skin Texture Texture Color No Abnormalities Noted: No No Abnormalities Noted: No Callus: No Atrophie Blanche: No Crepitus: No Cyanosis: No Excoriation: No Ecchymosis: No Induration: No Erythema: No Rash: No Hemosiderin Staining: No Scarring: No Mottled: No Pallor: No Moisture Rubor: No No Abnormalities Noted: No Dry / Scaly: No Temperature / Pain Maceration: No Temperature: No Abnormality Tenderness on Palpation: Yes Wound Preparation Ulcer Cleansing: Rinsed/Irrigated with Saline Topical Anesthetic Applied: Other: lidocaine 4%, Electronic Signature(s) Signed: 05/04/2018 4:56:16 PM By: Renne Crigler Entered By: Renne Crigler on 05/04/2018 14:55:02 [name] ([id number]) -------------------------------------------------------------------------------- Vitals Details Patient Name: [name] Date of Service: 05/04/2018 2:15 PM Medical Record Number: [id number] Patient Account Number: [id number] Date of Birth/Sex: 28-Sep-1930 (84 y.o. F) Treating RN: Renne Crigler Primary Care Lyly Canizales: Aram Beecham Other Clinician: Referring Palmina Clodfelter: Aram Beecham Treating Vietta Bonifield/Extender: Linwood Dibbles, HOYT Weeks in Treatment: 24 Vital Signs Time Taken: 14:44 Temperature (F): 98.1 Height (in): 60 Pulse (bpm): 82 Weight (lbs): 123 Respiratory Rate (breaths/min): 16 Body Mass Index (BMI): 24 Blood Pressure (mmHg): 119/47 Reference Range: 80 - 120 mg / dl Electronic Signature(s) Signed: 05/04/2018 4:56:16 PM By: Renne Crigler Entered By: Renne Crigler on 05/04/2018 14:45:26

## 2018-05-08 ENCOUNTER — Encounter: Payer: Medicare Other | Admitting: Physical Therapy

## 2018-05-09 NOTE — Progress Notes (Signed)
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 Patient's left anterior lower extremity ulcers appear to be doing very well there's no evidence of infection at this point and her anterior/proximal ulcer seems to be very close to closing the inferior ulcer is showing signs of improvement but lacking a behind just a little. With that being said it is the elbow that has been most concerned at this point I honestly do not believe that she has made significant progress since last week and obviously this could take time but nonetheless I'm not sure that in three weeks time we're going to have much of an answer although it depends on several factors. She states nonetheless she would like to try to give this the best shot we can and try to see if we can get things better. Integumentary (Hair, Skin) Wound #7 status is Open. Original cause of wound was Trauma. The wound is located on the Left,Proximal Lower Leg. The wound measures 0.5cm length x 0.3cm width x 0.1cm depth; 0.118cm^2 area and 0.012cm^3 volume. There is Fat Layer (Subcutaneous Tissue) Exposed exposed. There  is no tunneling or undermining noted. There is a medium amount of serous drainage noted. The wound margin is distinct with the outline attached to the wound base. There is large (67-100%) red granulation within the wound bed. There is a small (1-33%) amount of necrotic tissue within the wound bed including Adherent Slough. The periwound skin appearance did not exhibit: Callus, Crepitus, Excoriation, Induration, Rash, Scarring, Dry/Scaly, Maceration, Atrophie Blanche, Cyanosis, Ecchymosis, Hemosiderin Staining, Mottled, Pallor, Rubor, Erythema. Periwound temperature was noted as No Abnormality. Wound #8 status is Open. Original cause of wound was Trauma. The wound is located on the Left,Distal Lower Leg. The wound measures 1cm length x 1.3cm width x 0.1cm depth; 1.021cm^2 area and 0.102cm^3 volume. There is Fat Layer (Subcutaneous Tissue) Exposed exposed. There is no tunneling or undermining noted. There is a large amount of serous drainage noted. The wound margin is distinct with the outline attached to the wound base. There is large (67-100%) red, hyper - granulation within the wound bed. There is a small (1-33%) amount of necrotic tissue within the wound bed including Adherent Slough. The periwound skin appearance did not exhibit: Callus, Crepitus, Excoriation, Induration, Rash, Scarring, Dry/Scaly, Maceration, Atrophie Blanche, Cyanosis, Ecchymosis, Hemosiderin Staining, Mottled, Pallor, Rubor, Erythema. Periwound temperature was noted as No Abnormality. The periwound has tenderness on palpation. Wound #9 status is Open. Original cause of wound was Trauma. The wound is located on the Left Elbow. The wound measures 0.5cm length x  0.5cm width x 0.3cm depth; 0.196cm^2 area and 0.059cm^3 volume. There is Fat Layer (Subcutaneous Tissue) Exposed exposed. There is no tunneling noted, however, there is undermining starting at 11:00 and ending at 2:00 with a maximum distance of 0.3cm. There is a large  amount of serous drainage noted. The wound margin is distinct with the outline attached to the wound base. There is medium (34-66%) pink granulation within the wound bed. There is a medium (34-66%) amount of necrotic tissue within the wound bed including Adherent Slough. The periwound skin appearance did not exhibit: Callus, Crepitus, Excoriation, Induration, Rash, Scarring, Dry/Scaly, Maceration, Atrophie Blanche, Cyanosis, Ecchymosis, Hemosiderin Staining, Mottled, Pallor, Rubor, Erythema. Periwound temperature was noted as No Abnormality. The periwound has tenderness on palpation. 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 There was a Excisional Skin/Subcutaneous Tissue Debridement with a total area of 1.3 sq cm performed by STONE III, HOYT E., PA-C. With the following instrument(s): Curette to remove Viable and Non-Viable tissue/material. Material removed includes Subcutaneous Tissue and Slough and after achieving pain control using Lidocaine 4% Topical Solution. No specimens were taken. A time out was conducted at 15:23, prior to the start of the procedure. A Minimum amount of bleeding was controlled with Pressure. The procedure was tolerated well with a pain level of 0 throughout and a pain level of 0 following the procedure. Patient s Level of Consciousness post procedure was recorded as Awake and Alert and post-procedure vitals were taken including Temperature: 98.1 F, Pulse: 82  bpm, Respiratory Rate: 16 breaths/min, Blood Pressure: (119)/(47) mmHg. Post Debridement Measurements: 1cm length x 1.3cm width x 0.2cm depth; 0.204cm^3 volume. Character of Wound/Ulcer Post Debridement is improved. Post procedure Diagnosis Wound #8: Same as Pre-Procedure Plan Wound Cleansing: Wound #7 Left,Proximal Lower Leg: Clean wound with Normal Saline. Wound #8 Left,Distal Lower Leg: Clean wound with Normal Saline. Wound #9 Left Elbow: Clean wound with Normal Saline. Anesthetic (add to Medication List): Wound #7 Left,Proximal Lower Leg: Topical Lidocaine 4% cream applied to wound bed prior to debridement (In Clinic Only). Benzocaine Topical Anesthetic Spray applied to wound bed prior to debridement (In Clinic Only). Wound #8 Left,Distal Lower Leg: Topical Lidocaine 4% cream applied to wound bed prior to debridement (In Clinic Only). Benzocaine Topical Anesthetic Spray applied to wound bed prior to debridement (In Clinic Only). Wound #9 Left Elbow: Topical Lidocaine 4% cream applied to wound bed prior to debridement (In Clinic Only). Benzocaine Topical Anesthetic Spray applied to wound bed prior to debridement (In Clinic Only). Primary Wound Dressing: Wound #7 Left,Proximal Lower Leg: [name], [name] ([id number]) Silver Collagen Wound #8 Left,Distal Lower Leg: Silver Collagen Wound #9 Left Elbow: Hydrogel Silver Collagen Secondary Dressing: Wound #7 Left,Proximal Lower Leg: Boardered Foam Dressing - or coverlet or bandaid Wound #8 Left,Distal Lower Leg: Boardered Foam Dressing Wound #9 Left Elbow: Boardered Foam Dressing - or telfa island Dressing Change Frequency: Wound #7 Left,Proximal Lower Leg: Change dressing every other day. Wound #8 Left,Distal Lower Leg: Change dressing every other day. Wound #9 Left Elbow: Change dressing every other day. Follow-up  Appointments: Wound #7 Left,Proximal Lower Leg: Return Appointment in 1 week. Wound #8 Left,Distal Lower  Leg: Return Appointment in 1 week. Wound #9 Left Elbow: Return Appointment in 1 week. Edema Control: Wound #7 Left,Proximal Lower Leg: Elevate legs to the level of the heart and pump ankles as often as possible Wound #8 Left,Distal Lower Leg: Elevate legs to the level of the heart and pump ankles as often as possible Additional Orders / Instructions: Wound #7 Left,Proximal Lower Leg: Increase protein intake. Wound #8 Left,Distal Lower Leg: Increase protein intake. Wound #9 Left Elbow: Increase protein intake. I am going to suggest that we continue with the Current wound care measures for the next week. She's in agreement the plan. We will see were things stand at that point. Please see above for specific wound care orders. We will see patient for re-evaluation in 1 week(s) here in the clinic. If anything worsens or changes patient will contact our office for additional recommendations. Electronic Signature(s) Signed: 05/04/2018 9:42:33 PM By: Lenda Kelp PA-C Entered By: Lenda Kelp on 05/04/2018 21:14:42 [name] ([id number]) -------------------------------------------------------------------------------- ROS/PFSH Details Patient Name: [name] Date of Service: 05/04/2018 2:15 PM Medical Record Number: [id number] Patient Account Number: [id number] Date of Birth/Sex: 1930-09-26 (84 y.o. F) Treating RN: Curtis Sites Primary Care Provider: Aram Beecham Other Clinician: Referring Provider: Aram Beecham Treating Provider/Extender: Linwood Dibbles, HOYT Weeks in Treatment: 24 Information Obtained From Patient Wound History Do you currently have one or more open woundso Yes How many open wounds do you currently haveo 1 Approximately how long have you had your woundso 10 weeks How have you been treating your wound(s) until nowo mupirocin Has your wound(s) ever healed and then re-openedo No Have you had any lab work done in the past montho No Have you tested  positive for an antibiotic resistant organism (MRSA, VRE)o No Have you tested positive for osteomyelitis (bone infection)o No Have you had any tests for circulation on your legso Yes Who ordered the testo dr schnier [facility] Have you had other problems associated with your woundso Swelling Constitutional Symptoms (General Health) Complaints and Symptoms: Negative for: Fever; Chills Medical History: Past Medical History Notes: Back pain Eyes Medical History: Negative for: Cataracts; Glaucoma; Optic Neuritis Ear/Nose/Mouth/Throat Medical History: Negative for: Chronic sinus problems/congestion; Middle ear problems Past Medical History Notes: bilateral hearing aides Hematologic/Lymphatic Medical History: Negative for: Anemia; Hemophilia; Human Immunodeficiency Virus; Lymphedema; Sickle Cell Disease Respiratory Complaints and Symptoms: No Complaints or Symptoms Medical History: Negative for: Aspiration; Asthma; Chronic Obstructive Pulmonary Disease (COPD); Pneumothorax; Sleep Apnea; [name], [name] ([id number]) Tuberculosis Cardiovascular Complaints and Symptoms: No Complaints or Symptoms Medical History: Positive for: Hypertension Negative for: Angina; Arrhythmia; Congestive Heart Failure; Coronary Artery Disease; Deep Vein Thrombosis; Hypotension; Myocardial Infarction; Peripheral Arterial Disease; Peripheral Venous Disease; Phlebitis; Vasculitis Past Medical History Notes: pacemaker Gastrointestinal Medical History: Negative for: Cirrhosis ; Colitis; Crohnos; Hepatitis A; Hepatitis B; Hepatitis C Endocrine Medical History: Negative for: Type I Diabetes; Type II Diabetes Genitourinary Medical History: Negative for: End Stage Renal Disease Immunological Medical History: Negative for: Lupus Erythematosus; Raynaudos; Scleroderma Musculoskeletal Medical History: Positive for: Gout; Osteoarthritis Negative for: Rheumatoid Arthritis;  Osteomyelitis Neurologic Medical History: Positive for: Neuropathy - lower legs Negative for: Dementia; Quadriplegia; Paraplegia; Seizure Disorder Oncologic Medical History: Negative for: Received Chemotherapy; Received Radiation Psychiatric Complaints and Symptoms: No Complaints or Symptoms Medical History: Negative for: Anorexia/bulimia; Confinement Anxiety [name], [name] ([id number]) Immunizations Pneumococcal Vaccine: Received Pneumococcal Vaccination: Yes  Implantable Devices Family and Social History Cancer: Yes - Mother,Siblings; Diabetes: Yes - Father; Heart Disease: Yes - Father,Siblings; Hereditary Spherocytosis: No; Hypertension: Yes - Father,Siblings; Kidney Disease: No; Lung Disease: Yes - Siblings; Seizures: No; Stroke: No; Thyroid Problems: No; Tuberculosis: No; Never smoker; Marital Status - Married; Alcohol Use: Never; Drug Use: No History; Caffeine Use: Never; Financial Concerns: No; Food, Clothing or Shelter Needs: No; Support System Lacking: No; Transportation Concerns: No; Advanced Directives: No; Patient does not want information on Advanced Directives; Medical Power of Attorney: Yes - Mckaylee Dimalanta (Copy provided) Physician Affirmation I have reviewed and agree with the above information. Electronic Signature(s) Signed: 05/04/2018 9:42:33 PM By: Lenda Kelp PA-C Signed: 05/08/2018 5:40:20 PM By: Curtis Sites Entered By: Lenda Kelp on 05/04/2018 21:13:48 [name] ([id number]) -------------------------------------------------------------------------------- SuperBill Details Patient Name: [name] Date of Service: 05/04/2018 Medical Record Number: [id number] Patient Account Number: [id number] Date of Birth/Sex: May 06, 1930 (84 y.o. 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

## 2018-05-11 ENCOUNTER — Encounter: Payer: Medicare Other | Admitting: Physician Assistant

## 2018-05-11 DIAGNOSIS — L97822 Non-pressure chronic ulcer of other part of left lower leg with fat layer exposed: Secondary | ICD-10-CM | POA: Diagnosis not present

## 2018-05-13 NOTE — Progress Notes (Signed)
ROMANDA, SUM ([id number]) Visit Report for 05/11/2018 Arrival Information Details Patient Name: [name], [name]. Date of Service: 05/11/2018 3:15 PM Medical Record Number: [id number] Patient Account Number: [id number] Date of Birth/Sex: 1930-06-18 (84 y.o. F) Treating RN: Huel Coventry Primary Care Ahuva Poynor: Aram Beecham Other Clinician: Referring Jaymir Struble: Aram Beecham Treating Khyle Goodell/Extender: Linwood Dibbles, HOYT Weeks in Treatment: 25 Visit Information History Since Last Visit Added or deleted any medications: No Patient Arrived: Dan Humphreys Any new allergies or adverse reactions: No Arrival Time: 15:26 Had a fall or experienced change in No Accompanied By: caregiver activities of daily living that may affect Transfer Assistance: None risk of falls: Patient Identification Verified: Yes Signs or symptoms of abuse/neglect since last visito No Secondary Verification Process Yes Hospitalized since last visit: No Completed: Has Dressing in Place as Prescribed: Yes Patient Requires Transmission-Based No Pain Present Now: No Precautions: Patient Has Alerts: Yes Patient Alerts: non compressible left leg Electronic Signature(s) Signed: 05/11/2018 5:30:19 PM By: Elliot Gurney, BSN, RN, CWS, Kim RN, BSN Entered By: Elliot Gurney, BSN, RN, CWS, Kim on 05/11/2018 15:27:10 [name] ([id number]) -------------------------------------------------------------------------------- Encounter Discharge Information Details Patient Name: [name] Date of Service: 05/11/2018 3:15 PM Medical Record Number: [id number] Patient Account Number: [id number] Date of Birth/Sex: 02-14-1930 (84 y.o. F) Treating RN: Phillis Haggis Primary Care Rambo Sarafian: Aram Beecham Other Clinician: Referring Nollie Terlizzi: Aram Beecham Treating Elena Cothern/Extender: Linwood Dibbles, HOYT Weeks in Treatment: 25 Encounter Discharge Information Items Discharge Condition: Stable Ambulatory Status: Walker Discharge Destination:  Home Transportation: Private Auto Accompanied By: caregiver Schedule Follow-up Appointment: Yes Clinical Summary of Care: Electronic Signature(s) Signed: 05/11/2018 5:02:39 PM By: Alejandro Mulling Entered By: Alejandro Mulling on 05/11/2018 17:02:39 [name] ([id number]) -------------------------------------------------------------------------------- Lower Extremity Assessment Details Patient Name: [name] Date of Service: 05/11/2018 3:15 PM Medical Record Number: [id number] Patient Account Number: [id number] Date of Birth/Sex: 09/20/1930 (84 y.o. F) Treating RN: Huel Coventry Primary Care Milad Bublitz: Aram Beecham Other Clinician: Referring Drue Harr: Aram Beecham Treating Devlyn Retter/Extender: Linwood Dibbles, HOYT Weeks in Treatment: 25 Edema Assessment Assessed: [Left: No] [Right: No] [Left: Edema] [Right: :] Calf Left: Right: Point of Measurement: 30 cm From Medial Instep 32 cm cm Ankle Left: Right: Point of Measurement: 10 cm From Medial Instep 22.5 cm cm Vascular Assessment Pulses: Dorsalis Pedis Palpable: [Left:Yes] Posterior Tibial Extremity colors, hair growth, and conditions: Extremity Color: [Left:Hyperpigmented] Hair Growth on Extremity: [Left:No] Temperature of Extremity: [Left:Warm] Capillary Refill: [Left:< 3 seconds] Toe Nail Assessment Left: Right: Thick: No Discolored: No Deformed: No Improper Length and Hygiene: No Notes 42cm to knee Electronic Signature(s) Signed: 05/11/2018 5:30:19 PM By: Elliot Gurney, BSN, RN, CWS, Kim RN, BSN Entered By: Elliot Gurney, BSN, RN, CWS, Kim on 05/11/2018 15:38:32 [name] ([id number]) -------------------------------------------------------------------------------- Multi Wound Chart Details Patient Name: [name] Date of Service: 05/11/2018 3:15 PM Medical Record Number: [id number] Patient Account Number: [id number] Date of Birth/Sex: [birth year] (84 y.o. F) Treating RN: Curtis Sites Primary Care Allisen Pidgeon:  Aram Beecham Other Clinician: Referring Mackenzy Grumbine: Aram Beecham Treating Kierstyn Baranowski/Extender: Linwood Dibbles, HOYT Weeks in Treatment: 25 Vital Signs Height(in): 60 Pulse(bpm): 67 Weight(lbs): 123 Blood Pressure(mmHg): 130/52 Body Mass Index(BMI): 24 Temperature(F): 98.0 Respiratory Rate 16 (breaths/min): Photos: [7:No Photos] [8:No Photos] [9:No Photos] Wound Location: [7:Left, Proximal Lower Leg] [8:Left, Distal Lower Leg] [9:Left Elbow] Wounding Event: [7:Trauma] [8:Trauma] [9:Trauma] Primary Etiology: [7:Trauma, Other] [8:Trauma, Other] [9:Trauma, Other] Comorbid History: [7:N/A] [8:N/A] [9:Hypertension, Gout, Osteoarthritis, Neuropathy] Date Acquired: [7:03/22/2018] [8:03/22/2018] [9:01/26/2018] Weeks of Treatment: [7:6] [8:6] [9:2] Wound Status: [7:Open] [8:Open] [9:Open] Measurements L x  W x D [7:0.2x0.2x0.1] [8:0.9x1.2x0.1] [9:0.6x0.4x0.3] (cm) Area (cm) : [7:0.031] [8:0.848] [9:0.188] Volume (cm) : [7:0.003] [8:0.085] [9:0.057] % Reduction in Area: [7:95.10%] [8:32.50%] [9:-164.80%] % Reduction in Volume: [7:97.60%] [8:32.50%] [9:-307.10%] Starting Position 1 [9:1] (o'clock): Ending Position 1 [9:1] (o'clock): Maximum Distance 1 (cm): [9:0.4] Undermining: [7:N/A] [8:N/A] [9:Yes] Classification: [7:Full Thickness Without Exposed Support Structures] [8:Partial Thickness] [9:Full Thickness Without Exposed Support Structures] Exudate Amount: [7:N/A] [8:N/A] [9:Large] Exudate Type: [7:N/A] [8:N/A] [9:Serous] Exudate Color: [7:N/A] [8:N/A] [9:amber] Wound Margin: [7:N/A] [8:N/A] [9:Distinct, outline attached] Granulation Amount: [7:N/A] [8:N/A] [9:Medium (34-66%)] Granulation Quality: [7:N/A] [8:N/A] [9:Pink] Necrotic Amount: [7:N/A] [8:N/A] [9:Medium (34-66%)] Epithelialization: [7:N/A] [8:N/A] [9:None] Periwound Skin Texture: [7:No Abnormalities Noted] [8:No Abnormalities Noted] [9:Excoriation: No Induration: No Callus: No Crepitus: No Rash: No Scarring:  No] Periwound Skin Moisture: No Abnormalities Noted No Abnormalities Noted Maceration: No Dry/Scaly: No Periwound Skin Color: No Abnormalities Noted No Abnormalities Noted Atrophie Blanche: No Cyanosis: No Ecchymosis: No Erythema: No Hemosiderin Staining: No Mottled: No Pallor: No Rubor: No Temperature: N/A N/A No Abnormality Tenderness on Palpation: No No Yes Wound Preparation: N/A N/A Ulcer Cleansing: Rinsed/Irrigated with Saline Topical Anesthetic Applied: Other: lidocaine 4% Treatment Notes Electronic Signature(s) Signed: 05/11/2018 5:45:37 PM By: Curtis Sites Entered By: Curtis Sites on 05/11/2018 16:12:14 [name] ([id number]) -------------------------------------------------------------------------------- Multi-Disciplinary Care Plan Details Patient Name: [name] Date of Service: 05/11/2018 3:15 PM Medical Record Number: [id number] Patient Account Number: [id number] Date of Birth/Sex: 11-02-1930 (84 y.o. F) Treating RN: Curtis Sites Primary Care Jalynn Waddell: Aram Beecham Other Clinician: Referring Lupita Rosales: Aram Beecham Treating Indiyah Paone/Extender: Linwood Dibbles, HOYT Weeks in Treatment: 25 Active Inactive ` Orientation to the Wound Care Program Nursing Diagnoses: Knowledge deficit related to the wound healing center program Goals: Patient/caregiver will verbalize understanding of the Wound Healing Center Program Date Initiated: 11/14/2017 Target Resolution Date: 11/28/2017 Goal Status: Active Interventions: Provide education on orientation to the wound center Notes: ` Wound/Skin Impairment Nursing Diagnoses: Impaired tissue integrity Knowledge deficit related to ulceration/compromised skin integrity Goals: Patient/caregiver will verbalize understanding of skin care regimen Date Initiated: 11/14/2017 Target Resolution Date: 11/28/2017 Goal Status: Active Ulcer/skin breakdown will have a volume reduction of 30% by week 4 Date Initiated:  11/14/2017 Target Resolution Date: 11/28/2017 Goal Status: Active Interventions: Assess patient/caregiver ability to obtain necessary supplies Assess patient/caregiver ability to perform ulcer/skin care regimen upon admission and as needed Assess ulceration(s) every visit Treatment Activities: Skin care regimen initiated : 11/14/2017 Notes: Electronic Signature(s) Signed: 05/11/2018 5:45:37 PM By: Estrella Myrtle, Lawernce Keas ([id number]) Entered By: Curtis Sites on 05/11/2018 16:12:04 [name] ([id number]) -------------------------------------------------------------------------------- Pain Assessment Details Patient Name: [name] Date of Service: 05/11/2018 3:15 PM Medical Record Number: [id number] Patient Account Number: [id number] Date of Birth/Sex: 1930/09/19 (84 y.o. F) Treating RN: Huel Coventry Primary Care Kelena Garrow: Aram Beecham Other Clinician: Referring Kollyn Lingafelter: Aram Beecham Treating Braelyn Jenson/Extender: Linwood Dibbles, HOYT Weeks in Treatment: 25 Active Problems Location of Pain Severity and Description of Pain Patient Has Paino No Site Locations With Dressing Change: No Pain Management and Medication Current Pain Management: Goals for Pain Management Topical or injectable lidocaine is offered to patient for acute pain when surgical debridement is performed. If needed, Patient is instructed to use over the counter pain medication for the following 24-48 hours after debridement. Wound care MDs do not prescribed pain medications. Patient has chronic pain or uncontrolled pain. Patient has been instructed to make an appointment with their Primary Care Physician for pain management. Electronic Signature(s) Signed: 05/11/2018 5:30:19 PM By: Elliot Gurney,  BSN, RN, CWS, Kim RN, BSN Entered By: Elliot Gurney, BSN, RN, CWS, Kim on 05/11/2018 15:27:20 [name] ([id number]) -------------------------------------------------------------------------------- Patient/Caregiver  Education Details Patient Name: [name] Date of Service: 05/11/2018 3:15 PM Medical Record Number: [id number] Patient Account Number: [id number] Date of Birth/Gender: 30-Oct-1930 (84 y.o. F) Treating RN: Phillis Haggis Primary Care Physician: Aram Beecham Other Clinician: Referring Physician: Aram Beecham Treating Physician/Extender: Skeet Simmer in Treatment: 25 Education Assessment Education Provided To: Patient Education Topics Provided Wound/Skin Impairment: Handouts: Caring for Your Ulcer, Skin Care Do's and Dont's, Other: change dressing as ordered Methods: Demonstration, Explain/Verbal Responses: State content correctly Electronic Signature(s) Signed: 05/11/2018 5:38:20 PM By: Alejandro Mulling Entered By: Alejandro Mulling on 05/11/2018 17:02:56 [name] ([id number]) -------------------------------------------------------------------------------- Wound Assessment Details Patient Name: [name] Date of Service: 05/11/2018 3:15 PM Medical Record Number: [id number] Patient Account Number: [id number] Date of Birth/Sex: 1930/09/05 (84 y.o. F) Treating RN: Huel Coventry Primary Care Loran Fleet: Aram Beecham Other Clinician: Referring Aubry Rankin: Aram Beecham Treating Audrielle Vankuren/Extender: Linwood Dibbles, HOYT Weeks in Treatment: 25 Wound Status Wound Number: 7 Primary Etiology: Trauma, Other Wound Location: Left, Proximal Lower Leg Wound Status: Open Wounding Event: Trauma Date Acquired: 03/22/2018 Weeks Of Treatment: 6 Clustered Wound: No Photos Photo Uploaded By: Elliot Gurney, BSN, RN, CWS, Kim on 05/11/2018 16:27:50 Wound Measurements Length: (cm) 0.2 Width: (cm) 0.2 Depth: (cm) 0.1 Area: (cm) 0.031 Volume: (cm) 0.003 % Reduction in Area: 95.1% % Reduction in Volume: 97.6% Wound Description Full Thickness Without Exposed Support Classification: Structures Periwound Skin Texture Texture Color No Abnormalities Noted: No No Abnormalities  Noted: No Moisture No Abnormalities Noted: No Treatment Notes Wound #7 (Left, Proximal Lower Leg) 1. Cleansed with: Clean wound with Normal Saline 2. Anesthetic Topical Lidocaine 4% cream to wound bed prior to debridement [name], [name] ([id number]) 4. Dressing Applied: Hydrogel Prisma Ag 5. Secondary Dressing Applied Bordered Foam Dressing Electronic Signature(s) Signed: 05/11/2018 5:30:19 PM By: Elliot Gurney, BSN, RN, CWS, Kim RN, BSN Entered By: Elliot Gurney, BSN, RN, CWS, Kim on 05/11/2018 15:35:04 [name] ([id number]) -------------------------------------------------------------------------------- Wound Assessment Details Patient Name: [name] Date of Service: 05/11/2018 3:15 PM Medical Record Number: [id number] Patient Account Number: [id number] Date of Birth/Sex: 26-Jan-1930 (84 y.o. F) Treating RN: Huel Coventry Primary Care Sanam Marmo: Aram Beecham Other Clinician: Referring Dusty Raczkowski: Aram Beecham Treating Jaquaya Coyle/Extender: Linwood Dibbles, HOYT Weeks in Treatment: 25 Wound Status Wound Number: 8 Primary Etiology: Trauma, Other Wound Location: Left, Distal Lower Leg Wound Status: Open Wounding Event: Trauma Date Acquired: 03/22/2018 Weeks Of Treatment: 6 Clustered Wound: No Photos Photo Uploaded By: Elliot Gurney, BSN, RN, CWS, Kim on 05/11/2018 16:33:34 Wound Measurements Length: (cm) 0.9 Width: (cm) 1.2 Depth: (cm) 0.1 Area: (cm) 0.848 Volume: (cm) 0.085 % Reduction in Area: 32.5% % Reduction in Volume: 32.5% Wound Description Classification: Partial Thickness Periwound Skin Texture Texture Color No Abnormalities Noted: No No Abnormalities Noted: No Moisture No Abnormalities Noted: No Treatment Notes Wound #8 (Left, Distal Lower Leg) 1. Cleansed with: Clean wound with Normal Saline 2. Anesthetic Topical Lidocaine 4% cream to wound bed prior to debridement 4. Dressing Applied: [name], [name] ([id number]) Hydrogel Prisma Ag 5. Secondary Dressing  Applied Bordered Foam Dressing Electronic Signature(s) Signed: 05/11/2018 5:30:19 PM By: Elliot Gurney, BSN, RN, CWS, Kim RN, BSN Entered By: Elliot Gurney, BSN, RN, CWS, Kim on 05/11/2018 15:35:04 [name] ([id number]) -------------------------------------------------------------------------------- Wound Assessment Details Patient Name: [name] Date of Service: 05/11/2018 3:15 PM Medical Record Number: [id number] Patient Account Number: [id number] Date of Birth/Sex:  Oct 30, 1930 (84 y.o. F) Treating RN: Huel Coventry Primary Care Maevis Mumby: Aram Beecham Other Clinician: Referring Ryheem Jay: Aram Beecham Treating Sherran Margolis/Extender: Linwood Dibbles, HOYT Weeks in Treatment: 25 Wound Status Wound Number: 9 Primary Etiology: Trauma, Other Wound Location: Left Elbow Wound Status: Open Wounding Event: Trauma Comorbid Hypertension, Gout, Osteoarthritis, History: Neuropathy Date Acquired: 01/26/2018 Weeks Of Treatment: 2 Clustered Wound: No Photos Wound Measurements Length: (cm) 0.6 % Reduction Width: (cm) 0.4 % Reduction Depth: (cm) 0.3 Epitheliali Area: (cm) 0.188 Tunneling: Volume: (cm) 0.057 Underminin Starting Ending P Maximum in Area: -164.8% in Volume: -307.1% zation: None No g: Yes Position (o'clock): 1 osition (o'clock): 1 Distance: (cm) 0.4 Wound Description Full Thickness With Exposed Support Foul Odor Classification: Structures Slough/Fib Wound Margin: Distinct, outline attached Exudate Large Amount: Exudate Type: Serous Exudate Color: amber After Cleansing: No rino Yes Wound Bed Granulation Amount: Medium (34-66%) Exposed Structure Granulation Quality: Pink Fascia Exposed: No Necrotic Amount: Medium (34-66%) Fat Layer (Subcutaneous Tissue) Exposed: Yes Necrotic Quality: Adherent Slough Tendon Exposed: No [name], [name] ([id number]) Muscle Exposed: No Joint Exposed: No Bone Exposed: Yes Periwound Skin Texture Texture Color No Abnormalities Noted:  No No Abnormalities Noted: No Callus: No Atrophie Blanche: No Crepitus: No Cyanosis: No Excoriation: No Ecchymosis: No Induration: No Erythema: No Rash: No Hemosiderin Staining: No Scarring: No Mottled: No Pallor: No Moisture Rubor: No No Abnormalities Noted: No Dry / Scaly: No Temperature / Pain Maceration: No Temperature: No Abnormality Tenderness on Palpation: Yes Wound Preparation Ulcer Cleansing: Rinsed/Irrigated with Saline Topical Anesthetic Applied: Other: lidocaine 4%, Treatment Notes Wound #9 (Left Elbow) 1. Cleansed with: Clean wound with Normal Saline 2. Anesthetic Topical Lidocaine 4% cream to wound bed prior to debridement 4. Dressing Applied: Hydrogel Prisma Ag 5. Secondary Dressing Applied Bordered Foam Dressing Electronic Signature(s) Signed: 05/11/2018 5:30:19 PM By: Elliot Gurney, BSN, RN, CWS, Kim RN, BSN Entered By: Elliot Gurney, BSN, RN, CWS, Kim on 05/11/2018 16:34:19 [name] ([id number]) -------------------------------------------------------------------------------- Vitals Details Patient Name: [name] Date of Service: 05/11/2018 3:15 PM Medical Record Number: [id number] Patient Account Number: [id number] Date of Birth/Sex: 1930-01-10 (84 y.o. F) Treating RN: Huel Coventry Primary Care Pilar Corrales: Aram Beecham Other Clinician: Referring Deshon Koslowski: Aram Beecham Treating Reeva Davern/Extender: Linwood Dibbles, HOYT Weeks in Treatment: 25 Vital Signs Time Taken: 13:27 Temperature (F): 98.0 Height (in): 60 Pulse (bpm): 67 Weight (lbs): 123 Respiratory Rate (breaths/min): 16 Body Mass Index (BMI): 24 Blood Pressure (mmHg): 130/52 Reference Range: 80 - 120 mg / dl Electronic Signature(s) Signed: 05/11/2018 5:30:19 PM By: Elliot Gurney, BSN, RN, CWS, Kim RN, BSN Entered By: Elliot Gurney, BSN, RN, CWS, Kim on 05/11/2018 15:27:40

## 2018-05-14 NOTE — Progress Notes (Signed)
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 ulcers. Unfortunately in regard to her elbow this is very slow coming about as far as any improvement is concerned. I do feel like there may be a little bit more granulation noted in the base of the wound but nothing too significant unfortunately. I still can probe bone in the proximal portion of the wound which obviously explain to the patient is not good. She will be having a follow-up with her orthopedic surgeon in the next couple of weeks. In the meantime we are trying to do as much as we can to try to show signs of improvement in healing to avoid the need for any additional and further surgery. Nonetheless I explained to the patient yet again today I'm not sure if that is going to be feasible or not obviously it's more risk for her to continue to have an open wound with bone exposure then to the back in for additional surgery even though I know she doesn't want to go that route. Patient History Information obtained from Patient. Family History Cancer - Mother,Siblings, Diabetes - Father, Heart Disease - Father,Siblings, Hypertension -  Father,Siblings, Lung Disease - Siblings, No family history of Hereditary Spherocytosis, Kidney Disease, Seizures, Stroke, Thyroid Problems, Tuberculosis. Social History Never smoker, Marital Status - Married, Alcohol Use - Never, Drug Use - No History, Caffeine Use - Never. Medical And Surgical History Notes Constitutional Symptoms (General Health) Back pain Ear/Nose/Mouth/Throat bilateral hearing aides Cardiovascular pacemaker Review of Systems (ROS) Constitutional Symptoms (General Health) Denies complaints or symptoms of Fever, Chills. Respiratory TANAY, MASSIAH ([id number]) The patient has no complaints or symptoms. Cardiovascular The patient has no complaints or symptoms. Psychiatric The patient has no complaints or symptoms. Objective Constitutional Well-nourished and well-hydrated in no acute distress. Vitals Time Taken: 1:27 PM, Height: 60 in, Weight: 123 lbs, BMI: 24, Temperature: 98.0 F, Pulse: 67 bpm, Respiratory Rate: 16 breaths/min, Blood Pressure: 130/52 mmHg. Respiratory normal breathing without difficulty. clear to auscultation bilaterally. Cardiovascular regular rate and rhythm with normal S1, S2. Psychiatric this patient is able to make decisions and demonstrates good insight into disease process. Alert  and Oriented x 3. pleasant and cooperative. General Notes: Patient's wound on the left anterior lower extremity show signs of being very dry. Upon further questioning the advantage that she is using is just a Band-Aid at this point and unfortunately it's one that has open sides where the pad is. This is allowing too much air to get to the wound and it's actually dried out quite significantly which is one reason why her dressing seem to be getting stuck to the wound bed so readily. At this point I suggested that she get a different type of dressing and she was given the name as well is the overall appearance of what this would look like in order to find this  at the drugstore. Nonetheless in regard to her left elbow I did clean this out to breeding the wound well today but carefully in order to remove any slough and remaining dressing so that new Prisma could be applied. She tolerated this with minimal discomfort. Post debridement still bone as noted at the proximal portion of the wound although again there is some granulation. Integumentary (Hair, Skin) Wound #7 status is Open. Original cause of wound was Trauma. The wound is located on the Left,Proximal Lower Leg. The wound measures 0.2cm length x 0.2cm width x 0.1cm depth; 0.031cm^2 area and 0.003cm^3 volume. Wound #8 status is Open. Original cause of wound was Trauma. The wound is located on the Left,Distal Lower Leg. The wound measures 0.9cm length x 1.2cm width x 0.1cm depth; 0.848cm^2 area and 0.085cm^3 volume. Wound #9 status is Open. Original cause of wound was Trauma. The wound is located on the Left Elbow. The wound measures 0.6cm length x 0.4cm width x 0.3cm depth; 0.188cm^2 area and 0.057cm^3 volume. There is bone and Fat Layer (Subcutaneous Tissue) Exposed exposed. There is no tunneling noted, however, there is undermining starting at 1:00 and ending at 1:00 with a maximum distance of 0.4cm. There is a large amount of serous drainage noted. The wound margin is distinct with the outline attached to the wound base. There is medium (34-66%) pink granulation within the wound bed. There is a medium (34-66%) amount of necrotic tissue within the wound bed including Adherent Slough. The periwound skin appearance did not exhibit: Callus, Crepitus, Excoriation, Induration, Rash, Scarring, Dry/Scaly, Maceration, Atrophie Blanche, Cyanosis, Ecchymosis, Hemosiderin Staining, Mottled, Pallor, Rubor, Erythema. Periwound temperature was noted as No Abnormality. The periwound has tenderness on palpation. 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 There was a Excisional Skin/Subcutaneous Tissue Debridement with a total area of 0.24 sq cm performed by STONE III, HOYT E., PA-C. With the following instrument(s): Curette to remove Viable and Non-Viable tissue/material. Material removed includes Subcutaneous Tissue, Slough, and Other: prisma after achieving pain control using Lidocaine 4% Topical Solution. No specimens were taken. A time out was conducted at 16:19, prior to the start of the procedure. A Minimum amount of bleeding was controlled with Pressure. The procedure was tolerated well with a pain level of 0 throughout and a pain level of 0 following the procedure. Patient s  Level of Consciousness post procedure was recorded as Awake and Alert. Post Debridement Measurements: 0.6cm length x 0.4cm width x 0.4cm depth; 0.075cm^3 volume. Character of Wound/Ulcer Post Debridement is improved. Post procedure Diagnosis Wound #9: Same as Pre-Procedure Plan Wound Cleansing: Wound #7 Left,Proximal Lower Leg: Clean wound with Normal Saline. Wound #8 Left,Distal Lower Leg: Clean wound with Normal Saline. Wound #9 Left Elbow: Clean wound with Normal Saline. Anesthetic (add to Medication List): Wound #7 Left,Proximal Lower Leg: Topical Lidocaine 4% cream applied to wound bed prior to debridement (In Clinic Only). Benzocaine Topical Anesthetic Spray applied to wound bed prior to debridement (In Clinic Only). Wound #8 Left,Distal Lower Leg: Topical Lidocaine 4% cream applied to wound bed prior to  debridement (In Clinic Only). Benzocaine Topical Anesthetic Spray applied to wound bed prior to debridement (In Clinic Only). Wound #9 Left Elbow: Topical Lidocaine 4% cream applied to wound bed prior to debridement (In Clinic Only). Benzocaine Topical Anesthetic Spray applied to wound bed prior to debridement (In Clinic Only). Primary Wound Dressing: Wound #7 Left,Proximal Lower Leg: Other: - coverlet [name], [name] ([id number]) Wound #8 Left,Distal Lower Leg: Hydrogel Silver Collagen Wound #9 Left Elbow: Silver Collagen Secondary Dressing: Wound #8 Left,Distal Lower Leg: Boardered Foam Dressing Wound #9 Left Elbow: Boardered Foam Dressing - or [place]a [place] Dressing Change Frequency: Wound #7 Left,Proximal Lower Leg: Change dressing every other day. Wound #8 Left,Distal Lower Leg: Change dressing every other day. Wound #9 Left Elbow: Change dressing every other day. Follow-up Appointments: Wound #7 Left,Proximal Lower Leg: Return Appointment in 1 week. Wound #8 Left,Distal Lower Leg: Return Appointment in 1 week. Wound #9 Left Elbow: Return Appointment in 1 week. Edema Control: Wound #7 Left,Proximal Lower Leg: Elevate legs to the level of the heart and pump ankles as often as possible Wound #8 Left,Distal Lower Leg: Elevate legs to the level of the heart and pump ankles as often as possible Additional Orders / Instructions: Wound #7 Left,Proximal Lower Leg: Increase protein intake. Wound #8 Left,Distal Lower Leg: Increase protein intake. Wound #9 Left Elbow: Increase protein intake. We will see were things stand in one weeks time when I see her for reevaluation. If anything changes or worsens in the interim she will contact the office and let me know. Otherwise I'm hopeful that we will note some additional granulation in regard to the left elbow. I'm also hopeful that her lower extremity ulcers will continue to do better especially with the new bandages which I  think will be much better helping to keep the wound bed moist and not allowing it to dry out. Please see above for specific wound care orders. We will see patient for re-evaluation in 1 week(s) here in the clinic. If anything worsens or changes patient will contact our office for additional recommendations. Electronic Signature(s) Signed: 05/11/2018 11:45:50 PM By: Lenda Kelp PA-C Entered By: Lenda Kelp on 05/11/2018 23:44:49 [name] ([id number]) -------------------------------------------------------------------------------- ROS/PFSH Details Patient Name: [name] Date of Service: 05/11/2018 3:15 PM Medical Record Number: [id number] Patient Account Number: [id number] Date of Birth/Sex: Sep 10, 1930 (84 y.o. F) Treating RN: Curtis Sites Primary Care Provider: Aram Beecham Other Clinician: Referring Provider: Aram Beecham Treating Provider/Extender: Linwood Dibbles, HOYT Weeks in Treatment: 25 Information Obtained From Patient Wound History Do you currently have one or more open woundso Yes How many open wounds do you currently haveo 1 Approximately how long have you had your woundso 10 weeks How have you been treating your wound(s) until nowo  mupirocin Has your wound(s) ever healed and then re-openedo No Have you had any lab work done in the past montho No Have you tested positive for an antibiotic resistant organism (MRSA, VRE)o No Have you tested positive for osteomyelitis (bone infection)o No Have you had any tests for circulation on your legso Yes Who ordered the testo dr schnier [facility] Have you had other problems associated with your woundso Swelling Constitutional Symptoms (General Health) Complaints and Symptoms: Negative for: Fever; Chills Medical History: Past Medical History Notes: Back pain Eyes Medical History: Negative for: Cataracts; Glaucoma; Optic Neuritis Ear/Nose/Mouth/Throat Medical History: Negative for:  Chronic sinus problems/congestion; Middle ear problems Past Medical History Notes: bilateral hearing aides Hematologic/Lymphatic Medical History: Negative for: Anemia; Hemophilia; Human Immunodeficiency Virus; Lymphedema; Sickle Cell Disease Respiratory Complaints and Symptoms: No Complaints or Symptoms Medical History: Negative for: Aspiration; Asthma; Chronic Obstructive Pulmonary Disease (COPD); Pneumothorax; Sleep Apnea; [name], [name] ([id number]) Tuberculosis Cardiovascular Complaints and Symptoms: No Complaints or Symptoms Medical History: Positive for: Hypertension Negative for: Angina; Arrhythmia; Congestive Heart Failure; Coronary Artery Disease; Deep Vein Thrombosis; Hypotension; Myocardial Infarction; Peripheral Arterial Disease; Peripheral Venous Disease; Phlebitis; Vasculitis Past Medical History Notes: pacemaker Gastrointestinal Medical History: Negative for: Cirrhosis ; Colitis; Crohnos; Hepatitis A; Hepatitis B; Hepatitis C Endocrine Medical History: Negative for: Type I Diabetes; Type II Diabetes Genitourinary Medical History: Negative for: End Stage Renal Disease Immunological Medical History: Negative for: Lupus Erythematosus; Raynaudos; Scleroderma Musculoskeletal Medical History: Positive for: Gout; Osteoarthritis Negative for: Rheumatoid Arthritis; Osteomyelitis Neurologic Medical History: Positive for: Neuropathy - lower legs Negative for: Dementia; Quadriplegia; Paraplegia; Seizure Disorder Oncologic Medical History: Negative for: Received Chemotherapy; Received Radiation Psychiatric Complaints and Symptoms: No Complaints or Symptoms Medical History: Negative for: Anorexia/bulimia; Confinement Anxiety [name], [name] ([id number]) Immunizations Pneumococcal Vaccine: Received Pneumococcal Vaccination: Yes Implantable Devices Family and Social History Cancer: Yes - Mother,Siblings; Diabetes: Yes - Father; Heart Disease: Yes -  Father,Siblings; Hereditary Spherocytosis: No; Hypertension: Yes - Father,Siblings; Kidney Disease: No; Lung Disease: Yes - Siblings; Seizures: No; Stroke: No; Thyroid Problems: No; Tuberculosis: No; Never smoker; Marital Status - Married; Alcohol Use: Never; Drug Use: No History; Caffeine Use: Never; Financial Concerns: No; Food, Clothing or Shelter Needs: No; Support System Lacking: No; Transportation Concerns: No; Advanced Directives: No; Patient does not want information on Advanced Directives; Medical Power of Attorney: Yes - Esabella Stockinger (Copy provided) Physician Affirmation I have reviewed and agree with the above information. Electronic Signature(s) Signed: 05/11/2018 11:45:50 PM By: Lenda Kelp PA-C Signed: 05/14/2018 3:38:14 PM By: Curtis Sites Entered By: Lenda Kelp on 05/11/2018 23:43:04 [name] ([id number]) -------------------------------------------------------------------------------- SuperBill Details Patient Name: [name] Date of Service: 05/11/2018 Medical Record Number: [id number] Patient Account Number: [id number] Date of Birth/Sex: October 14, 1930 (84 y.o. 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 Facility Procedures CPT4: Description Modifier Quantity Code [id number] 11042 - DEB SUBQ TISSUE 20 SQ CM/< 1 ICD-10 Diagnosis Description L98.495 Non-pressure chronic ulcer of skin of other sites with  muscle involvement without evidence of necrosis T81.31XA Disruption of  external operation (surgical) wound, not elsewhere classified, initial encounter Physician Procedures CPT4: Description Modifier Quantity Code [id number] 11042 - WC PHYS SUBQ TISS 20 SQ CM 1 ICD-10 Diagnosis Description L98.495 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

## 2018-05-15 ENCOUNTER — Encounter: Payer: Medicare Other | Attending: Physician Assistant | Admitting: Physician Assistant

## 2018-05-15 DIAGNOSIS — L98496 Non-pressure chronic ulcer of skin of other sites with bone involvement without evidence of necrosis: Secondary | ICD-10-CM | POA: Diagnosis not present

## 2018-05-15 DIAGNOSIS — I10 Essential (primary) hypertension: Secondary | ICD-10-CM | POA: Insufficient documentation

## 2018-05-15 DIAGNOSIS — L98491 Non-pressure chronic ulcer of skin of other sites limited to breakdown of skin: Secondary | ICD-10-CM | POA: Insufficient documentation

## 2018-05-15 DIAGNOSIS — L97822 Non-pressure chronic ulcer of other part of left lower leg with fat layer exposed: Secondary | ICD-10-CM | POA: Insufficient documentation

## 2018-05-16 NOTE — Progress Notes (Signed)
SAESHA, LLERENAS ([id number]) Visit Report for 05/15/2018 Arrival Information Details Patient Name: [name], [name] Date of Service: 05/15/2018 8:45 AM Medical Record Number: [id number] Patient Account Number: [id number] Date of Birth/Sex: 1930-08-19 (84 y.o. F) Treating RN: Curtis Sites Primary Care Jakwon Gayton: Aram Beecham Other Clinician: Referring Rahmah Mccamy: Aram Beecham Treating Tinita Brooker/Extender: Linwood Dibbles, HOYT Weeks in Treatment: 26 Visit Information History Since Last Visit Added or deleted any medications: No Patient Arrived: Walker Any new allergies or adverse reactions: No Arrival Time: 08:48 Had a fall or experienced change in No Accompanied By: caregiver activities of daily living that may affect Transfer Assistance: None risk of falls: Patient Identification Verified: Yes Signs or symptoms of abuse/neglect since last visito No Secondary Verification Process Yes Hospitalized since last visit: No Completed: Implantable device outside of the clinic excluding No Patient Requires Transmission-Based No cellular tissue based products placed in the center Precautions: since last visit: Patient Has Alerts: Yes Has Dressing in Place as Prescribed: Yes Patient Alerts: non compressible left Has Compression in Place as Prescribed: No leg Pain Present Now: No Electronic Signature(s) Signed: 05/15/2018 5:22:13 PM By: Curtis Sites Entered By: Curtis Sites on 05/15/2018 08:48:52 [name] ([id number]) -------------------------------------------------------------------------------- Clinic Level of Care Assessment Details Patient Name: [name] Date of Service: 05/15/2018 8:45 AM Medical Record Number: [id number] Patient Account Number: [id number] Date of Birth/Sex: 1930-06-15 (84 y.o. F) Treating RN: Phillis Haggis Primary Care Kaelei Wheeler: Aram Beecham Other Clinician: Referring Kayle Correa: Aram Beecham Treating Dejan Angert/Extender: Linwood Dibbles, HOYT Weeks  in Treatment: 26 Clinic Level of Care Assessment Items TOOL 4 Quantity Score X - Use when only an EandM is performed on FOLLOW-UP visit 1 0 ASSESSMENTS - Nursing Assessment / Reassessment X - Reassessment of Co-morbidities (includes updates in patient status) 1 10 X- 1 5 Reassessment of Adherence to Treatment Plan ASSESSMENTS - Wound and Skin Assessment / Reassessment []  - Simple Wound Assessment / Reassessment - one wound 0 X- 2 5 Complex Wound Assessment / Reassessment - multiple wounds []  - 0 Dermatologic / Skin Assessment (not related to wound area) ASSESSMENTS - Focused Assessment []  - Circumferential Edema Measurements - multi extremities 0 []  - 0 Nutritional Assessment / Counseling / Intervention []  - 0 Lower Extremity Assessment (monofilament, tuning fork, pulses) []  - 0 Peripheral Arterial Disease Assessment (using hand held doppler) ASSESSMENTS - Ostomy and/or Continence Assessment and Care []  - Incontinence Assessment and Management 0 []  - 0 Ostomy Care Assessment and Management (repouching, etc.) PROCESS - Coordination of Care X - Simple Patient / Family Education for ongoing care 1 15 []  - 0 Complex (extensive) Patient / Family Education for ongoing care []  - 0 Staff obtains Chiropractor, Records, Test Results / Process Orders []  - 0 Staff telephones HHA, Nursing Homes / Clarify orders / etc []  - 0 Routine Transfer to another Facility (non-emergent condition) []  - 0 Routine Hospital Admission (non-emergent condition) []  - 0 New Admissions / Manufacturing engineer / Ordering NPWT, Apligraf, etc. []  - 0 Emergency Hospital Admission (emergent condition) X- 1 10 Simple Discharge Coordination RAELEE, ROSSMANN ([id number]) []  - 0 Complex (extensive) Discharge Coordination PROCESS - Special Needs []  - Pediatric / Minor Patient Management 0 []  - 0 Isolation Patient Management []  - 0 Hearing / Language / Visual special needs []  - 0 Assessment of Community  assistance (transportation, D/C planning, etc.) []  - 0 Additional assistance / Altered mentation []  - 0 Support Surface(s) Assessment (bed, cushion, seat, etc.) INTERVENTIONS - Wound Cleansing / Measurement []  -  Simple Wound Cleansing - one wound 0 X- 2 5 Complex Wound Cleansing - multiple wounds X- 1 5 Wound Imaging (photographs - any number of wounds) []  - 0 Wound Tracing (instead of photographs) []  - 0 Simple Wound Measurement - one wound X- 2 5 Complex Wound Measurement - multiple wounds INTERVENTIONS - Wound Dressings X - Small Wound Dressing one or multiple wounds 2 10 []  - 0 Medium Wound Dressing one or multiple wounds []  - 0 Large Wound Dressing one or multiple wounds X- 1 5 Application of Medications - topical []  - 0 Application of Medications - injection INTERVENTIONS - Miscellaneous []  - External ear exam 0 []  - 0 Specimen Collection (cultures, biopsies, blood, body fluids, etc.) []  - 0 Specimen(s) / Culture(s) sent or taken to Lab for analysis []  - 0 Patient Transfer (multiple staff / Nurse, adult / Similar devices) []  - 0 Simple Staple / Suture removal (25 or less) []  - 0 Complex Staple / Suture removal (26 or more) []  - 0 Hypo / Hyperglycemic Management (close monitor of Blood Glucose) X- 1 15 Ankle / Brachial Index (ABI) - do not check if billed separately X- 1 5 Vital Signs ALYRIA, SWEIGERT ([id number]) Has the patient been seen at the hospital within the last three years: Yes Total Score: 120 Level Of Care: New/Established - Level 4 Electronic Signature(s) Signed: 05/15/2018 5:20:44 PM By: Alejandro Mulling Entered By: Alejandro Mulling on 05/15/2018 09:34:31 [name] ([id number]) -------------------------------------------------------------------------------- Encounter Discharge Information Details Patient Name: [name] Date of Service: 05/15/2018 8:45 AM Medical Record Number: [id number] Patient Account Number: [id number] Date of  Birth/Sex: 04-Feb-1930 (84 y.o. F) Treating RN: Phillis Haggis Primary Care Darion Juhasz: Aram Beecham Other Clinician: Referring Joseline Mccampbell: Aram Beecham Treating Aseret Hoffman/Extender: Linwood Dibbles, HOYT Weeks in Treatment: 76 Encounter Discharge Information Items Discharge Condition: Stable Ambulatory Status: Walker Discharge Destination: Home Transportation: Private Auto Accompanied By: caregiver Schedule Follow-up Appointment: Yes Clinical Summary of Care: Electronic Signature(s) Signed: 05/15/2018 5:20:44 PM By: Alejandro Mulling Entered By: Alejandro Mulling on 05/15/2018 09:50:35 [name] ([id number]) -------------------------------------------------------------------------------- Lower Extremity Assessment Details Patient Name: [name] Date of Service: 05/15/2018 8:45 AM Medical Record Number: [id number] Patient Account Number: [id number] Date of Birth/Sex: 1930/11/04 (84 y.o. F) Treating RN: Curtis Sites Primary Care Roel Douthat: Aram Beecham Other Clinician: Referring Izamar Linden: Aram Beecham Treating Tyner Codner/Extender: Linwood Dibbles, HOYT Weeks in Treatment: 26 Edema Assessment Assessed: [Left: No] [Right: No] [Left: Edema] [Right: :] Calf Left: Right: Point of Measurement: 30 cm From Medial Instep 32.1 cm cm Ankle Left: Right: Point of Measurement: 10 cm From Medial Instep 22.7 cm cm Vascular Assessment Pulses: Posterior Tibial Blood Pressure: Brachial: [Left:140] Dorsalis Pedis: 180 [Left:Dorsalis Pedis:] Ankle: Posterior Tibial: 182 [Left:Posterior Tibial: 1.30] Notes heel to knee-38cm Electronic Signature(s) Signed: 05/15/2018 5:20:44 PM By: Alejandro Mulling Signed: 05/15/2018 5:22:13 PM By: Curtis Sites Entered By: Alejandro Mulling on 05/15/2018 17:12:53 [name] ([id number]) -------------------------------------------------------------------------------- Multi Wound Chart Details Patient Name: [name] Date of Service: 05/15/2018  8:45 AM Medical Record Number: [id number] Patient Account Number: [id number] Date of Birth/Sex: September 08, 1930 (84 y.o. F) Treating RN: Phillis Haggis Primary Care Reginald Mangels: Aram Beecham Other Clinician: Referring Verdun Rackley: Aram Beecham Treating Shaylynn Nulty/Extender: Linwood Dibbles, HOYT Weeks in Treatment: 26 Vital Signs Height(in): 60 Pulse(bpm): 64 Weight(lbs): 123 Blood Pressure(mmHg): 145/49 Body Mass Index(BMI): 24 Temperature(F): 97.9 Respiratory Rate 16 (breaths/min): Photos: [7:No Photos] [8:No Photos] [9:No Photos] Wound Location: [7:Left Lower Leg - Proximal] [8:Left Lower Leg - Distal] [9:Left Elbow] Wounding  Event: [7:Trauma] [8:Trauma] [9:Trauma] Primary Etiology: [7:Trauma, Other] [8:Trauma, Other] [9:Trauma, Other] Comorbid History: [7:Hypertension, Gout, Osteoarthritis, Neuropathy] [8:Hypertension, Gout, Osteoarthritis, Neuropathy] [9:Hypertension, Gout, Osteoarthritis, Neuropathy] Date Acquired: [7:03/22/2018] [8:03/22/2018] [9:01/26/2018] Weeks of Treatment: [7:7] [8:7] [9:2] Wound Status: [7:Open] [8:Open] [9:Open] Measurements L x W x D [7:0.1x0.1x0.1] [8:0.6x1.1x0.1] [9:0.6x0.4x0.4] (cm) Area (cm) : [7:0.008] [8:0.518] [9:0.188] Volume (cm) : [7:0.001] [8:0.052] [9:0.075] % Reduction in Area: [7:98.70%] [8:58.80%] [9:-164.80%] % Reduction in Volume: [7:99.20%] [8:58.70%] [9:-435.70%] Starting Position 1 [9:12] (o'clock): Ending Position 1 [9:12] (o'clock): Maximum Distance 1 (cm): [9:0.9] Undermining: [7:No] [8:No] [9:Yes] Classification: [7:Full Thickness Without Exposed Support Structures] [8:Partial Thickness] [9:Full Thickness With Exposed Support Structures] Exudate Amount: [7:None Present] [8:Medium] [9:Large] Exudate Type: [7:N/A] [8:Serous] [9:Serous] Exudate Color: [7:N/A] [8:amber] [9:amber] Wound Margin: [7:Indistinct, nonvisible] [8:Flat and Intact] [9:Distinct, outline attached] Granulation Amount: [7:None Present (0%)] [8:Medium (34-66%)]  [9:Medium (34-66%)] Granulation Quality: [7:N/A] [8:Red] [9:Pink] Necrotic Amount: [7:None Present (0%)] [8:Medium (34-66%)] [9:Medium (34-66%)] Exposed Structures: [7:Fascia: No Fat Layer (Subcutaneous Tissue) Exposed: No Tendon: No Muscle: No Joint: No Bone: No] [8:Fat Layer (Subcutaneous Tissue) Exposed: Yes Fascia: No Tendon: No Muscle: No Joint: No Bone: No] [9:Fat Layer (Subcutaneous Tissue) Exposed: Yes Bone:  Yes Fascia: No Tendon: No Muscle: No Joint: No] Epithelialization: None None None Periwound Skin Texture: Excoriation: No Excoriation: No Excoriation: No Induration: No Induration: No Induration: No Callus: No Callus: No Callus: No Crepitus: No Crepitus: No Crepitus: No Rash: No Rash: No Rash: No Scarring: No Scarring: No Scarring: No Periwound Skin Moisture: Maceration: No Maceration: No Maceration: No Dry/Scaly: No Dry/Scaly: No Dry/Scaly: No Periwound Skin Color: Hemosiderin Staining: Yes Ecchymosis: Yes Atrophie Blanche: No Atrophie Blanche: No Hemosiderin Staining: Yes Cyanosis: No Cyanosis: No Atrophie Blanche: No Ecchymosis: No Ecchymosis: No Cyanosis: No Erythema: No Erythema: No Erythema: No Hemosiderin Staining: No Mottled: No Mottled: No Mottled: No Pallor: No Pallor: No Pallor: No Rubor: No Rubor: No Rubor: No Temperature: No Abnormality No Abnormality No Abnormality Tenderness on Palpation: No Yes Yes Wound Preparation: Ulcer Cleansing: Ulcer Cleansing: Ulcer Cleansing: Rinsed/Irrigated with Saline Rinsed/Irrigated with Saline Rinsed/Irrigated with Saline Topical Anesthetic Applied: Topical Anesthetic Applied: Topical Anesthetic Applied: None Other: lidocaine 4% Other: lidocaine 4% Treatment Notes Electronic Signature(s) Signed: 05/15/2018 5:20:44 PM By: Alejandro Mulling Entered By: Alejandro Mulling on 05/15/2018 09:17:41 [name]  ([id number]) -------------------------------------------------------------------------------- Multi-Disciplinary Care Plan Details Patient Name: [name] Date of Service: 05/15/2018 8:45 AM Medical Record Number: [id number] Patient Account Number: [id number] Date of Birth/Sex: 1930-12-01 (84 y.o. F) Treating RN: Phillis Haggis Primary Care Laurenashley Viar: Aram Beecham Other Clinician: Referring Nyoka Alcoser: Aram Beecham Treating Saundra Gin/Extender: Linwood Dibbles, HOYT Weeks in Treatment: 67 Active Inactive ` Orientation to the Wound Care Program Nursing Diagnoses: Knowledge deficit related to the wound healing center program Goals: Patient/caregiver will verbalize understanding of the Wound Healing Center Program Date Initiated: 11/14/2017 Target Resolution Date: 11/28/2017 Goal Status: Active Interventions: Provide education on orientation to the wound center Notes: ` Wound/Skin Impairment Nursing Diagnoses: Impaired tissue integrity Knowledge deficit related to ulceration/compromised skin integrity Goals: Patient/caregiver will verbalize understanding of skin care regimen Date Initiated: 11/14/2017 Target Resolution Date: 11/28/2017 Goal Status: Active Ulcer/skin breakdown will have a volume reduction of 30% by week 4 Date Initiated: 11/14/2017 Target Resolution Date: 11/28/2017 Goal Status: Active Interventions: Assess patient/caregiver ability to obtain necessary supplies Assess patient/caregiver ability to perform ulcer/skin care regimen upon admission and as needed Assess ulceration(s) every visit Treatment Activities: Skin care regimen initiated : 11/14/2017 Notes: Electronic Signature(s) Signed: 05/15/2018 5:20:44 PM By:  Alejandro Mulling Aucilla, Luana VMarland Kitchen ([id number]) Entered By: Alejandro Mulling on 05/15/2018 09:17:24 [name] ([id number]) -------------------------------------------------------------------------------- Pain Assessment Details Patient Name:  CHANEE, HENRICKSON Date of Service: 05/15/2018 8:45 AM Medical Record Number: [id number] Patient Account Number: [id number] Date of Birth/Sex: [birth year] (84 y.o. F) Treating RN: Curtis Sites Primary Care Elijah Michaelis: Aram Beecham Other Clinician: Referring Yuma Blucher: Aram Beecham Treating Josua Ferrebee/Extender: Linwood Dibbles, HOYT Weeks in Treatment: 26 Active Problems Location of Pain Severity and Description of Pain Patient Has Paino Yes Site Locations Pain Location: Pain in Ulcers With Dressing Change: Yes Duration of the Pain. Constant / Intermittento Intermittent Pain Management and Medication Current Pain Management: Electronic Signature(s) Signed: 05/15/2018 5:22:13 PM By: Curtis Sites Entered By: Curtis Sites on 05/15/2018 08:49:08 [name] ([id number]) -------------------------------------------------------------------------------- Patient/Caregiver Education Details Patient Name: [name] Date of Service: 05/15/2018 8:45 AM Medical Record Number: [id number] Patient Account Number: [id number] Date of Birth/Gender: [birth year] (84 y.o. F) Treating RN: Phillis Haggis Primary Care Physician: Aram Beecham Other Clinician: Referring Physician: Aram Beecham Treating Physician/Extender: Skeet Simmer in Treatment: 78 Education Assessment Education Provided To: Patient Education Topics Provided Wound/Skin Impairment: Handouts: Caring for Your Ulcer Methods: Explain/Verbal Responses: State content correctly Electronic Signature(s) Signed: 05/15/2018 5:20:44 PM By: Alejandro Mulling Entered By: Alejandro Mulling on 05/15/2018 09:50:48 [name] ([id number]) -------------------------------------------------------------------------------- Wound Assessment Details Patient Name: [name] Date of Service: 05/15/2018 8:45 AM Medical Record Number: [id number] Patient Account Number: [id number] Date of Birth/Sex: February 15, 1930 (84 y.o. F) Treating RN:  Phillis Haggis Primary Care Jara Feider: Aram Beecham Other Clinician: Referring Grayer Sproles: Aram Beecham Treating Rocio Wolak/Extender: Linwood Dibbles, HOYT Weeks in Treatment: 26 Wound Status Wound Number: 7 Primary Etiology: Trauma, Other Wound Location: Left Lower Leg - Proximal Wound Status: Healed - Epithelialized Wounding Event: Trauma Comorbid Hypertension, Gout, Osteoarthritis, History: Neuropathy Date Acquired: 03/22/2018 Weeks Of Treatment: 7 Clustered Wound: No Photos Photo Uploaded By: Curtis Sites on 05/15/2018 09:38:19 Wound Measurements Length: (cm) 0 Width: (cm) 0 Depth: (cm) 0 Area: (cm) 0 Volume: (cm) 0 % Reduction in Area: 100% % Reduction in Volume: 100% Epithelialization: Large (67-100%) Tunneling: No Undermining: No Wound Description Full Thickness Without Exposed Support Foul O Classification: Structures Slough Wound Margin: Indistinct, nonvisible Exudate None Present Amount: dor After Cleansing: No /Fibrino No Wound Bed Granulation Amount: None Present (0%) Exposed Structure Necrotic Amount: None Present (0%) Fascia Exposed: No Fat Layer (Subcutaneous Tissue) Exposed: No Tendon Exposed: No Muscle Exposed: No Joint Exposed: No Bone Exposed: No Periwound Skin Texture Texture Color YAELI, HARTUNG ([id number]) No Abnormalities Noted: No No Abnormalities Noted: No Callus: No Atrophie Blanche: No Crepitus: No Cyanosis: No Excoriation: No Ecchymosis: No Induration: No Erythema: No Rash: No Hemosiderin Staining: Yes Scarring: No Mottled: No Pallor: No Moisture Rubor: No No Abnormalities Noted: No Dry / Scaly: No Temperature / Pain Maceration: No Temperature: No Abnormality Wound Preparation Ulcer Cleansing: Rinsed/Irrigated with Saline Topical Anesthetic Applied: None Electronic Signature(s) Signed: 05/15/2018 5:20:44 PM By: Alejandro Mulling Entered By: Alejandro Mulling on 05/15/2018 09:18:25 [name]  ([id number]) -------------------------------------------------------------------------------- Wound Assessment Details Patient Name: [name] Date of Service: 05/15/2018 8:45 AM Medical Record Number: [id number] Patient Account Number: [id number] Date of Birth/Sex: Jul 01, 1930 (84 y.o. F) Treating RN: Curtis Sites Primary Care Leathia Farnell: Aram Beecham Other Clinician: Referring Ichelle Harral: Aram Beecham Treating Akayla Brass/Extender: Linwood Dibbles, HOYT Weeks in Treatment: 26 Wound Status Wound Number: 8 Primary Etiology: Trauma, Other Wound Location: Left Lower Leg - Distal Wound Status: Open Wounding Event: Trauma Comorbid  Hypertension, Gout, Osteoarthritis, History: Neuropathy Date Acquired: 03/22/2018 Weeks Of Treatment: 7 Clustered Wound: No Photos Photo Uploaded By: Curtis Sites on 05/15/2018 09:38:45 Wound Measurements Length: (cm) 0.6 Width: (cm) 1.1 Depth: (cm) 0.1 Area: (cm) 0.518 Volume: (cm) 0.052 % Reduction in Area: 58.8% % Reduction in Volume: 58.7% Epithelialization: None Tunneling: No Undermining: No Wound Description Classification: Partial Thickness Foul O Wound Margin: Flat and Intact Slough Exudate Amount: Medium Exudate Type: Serous Exudate Color: amber dor After Cleansing: No /Fibrino Yes Wound Bed Granulation Amount: Medium (34-66%) Exposed Structure Granulation Quality: Red Fascia Exposed: No Necrotic Amount: Medium (34-66%) Fat Layer (Subcutaneous Tissue) Exposed: Yes Necrotic Quality: Adherent Slough Tendon Exposed: No Muscle Exposed: No Joint Exposed: No Bone Exposed: No Periwound Skin Texture FLANNERY, CAVALLERO V. ([id number]) Texture Color No Abnormalities Noted: No No Abnormalities Noted: No Callus: No Atrophie Blanche: No Crepitus: No Cyanosis: No Excoriation: No Ecchymosis: Yes Induration: No Erythema: No Rash: No Hemosiderin Staining: Yes Scarring: No Mottled: No Pallor: No Moisture Rubor: No No Abnormalities  Noted: No Dry / Scaly: No Temperature / Pain Maceration: No Temperature: No Abnormality Tenderness on Palpation: Yes Wound Preparation Ulcer Cleansing: Rinsed/Irrigated with Saline Topical Anesthetic Applied: Other: lidocaine 4%, Treatment Notes Wound #8 (Left, Distal Lower Leg) 1. Cleansed with: Clean wound with Normal Saline 2. Anesthetic Topical Lidocaine 4% cream to wound bed prior to debridement 4. Dressing Applied: Prisma Ag 5. Secondary Dressing Applied Bordered Foam Dressing Telfa Island Notes telfa island elbow BFD left lower leg Electronic Signature(s) Signed: 05/15/2018 5:22:13 PM By: Curtis Sites Entered By: Curtis Sites on 05/15/2018 08:57:22 [name] ([id number]) -------------------------------------------------------------------------------- Wound Assessment Details Patient Name: [name] Date of Service: 05/15/2018 8:45 AM Medical Record Number: [id number] Patient Account Number: [id number] Date of Birth/Sex: [birth year] (84 y.o. F) Treating RN: Curtis Sites Primary Care Illene Sweeting: Aram Beecham Other Clinician: Referring Clifford Coudriet: Aram Beecham Treating Fahed Morten/Extender: Linwood Dibbles, HOYT Weeks in Treatment: 26 Wound Status Wound Number: 9 Primary Etiology: Trauma, Other Wound Location: Left Elbow Wound Status: Open Wounding Event: Trauma Comorbid Hypertension, Gout, Osteoarthritis, History: Neuropathy Date Acquired: 01/26/2018 Weeks Of Treatment: 2 Clustered Wound: No Photos Photo Uploaded By: Curtis Sites on 05/15/2018 09:38:46 Wound Measurements Length: (cm) 0.6 Width: (cm) 0.4 Depth: (cm) 0.4 Area: (cm) 0.188 Volume: (cm) 0.075 % Reduction in Area: -164.8% % Reduction in Volume: -435.7% Epithelialization: None Tunneling: No Undermining: Yes Starting Position (o'clock): 12 Ending Position (o'clock): 12 Maximum Distance: (cm) 0.9 Wound Description Full Thickness With Exposed Support Foul  O Classification: Structures Slough Wound Margin: Distinct, outline attached Exudate Large Amount: Exudate Type: Serous Exudate Color: amber dor After Cleansing: No /Fibrino Yes Wound Bed Granulation Amount: Medium (34-66%) Exposed Structure Granulation Quality: Pink Fascia Exposed: No Necrotic Amount: Medium (34-66%) Fat Layer (Subcutaneous Tissue) Exposed: Yes LASHIKA, ERKER ([id number]) Necrotic Quality: Adherent Slough Tendon Exposed: No Muscle Exposed: No Joint Exposed: No Bone Exposed: Yes Periwound Skin Texture Texture Color No Abnormalities Noted: No No Abnormalities Noted: No Callus: No Atrophie Blanche: No Crepitus: No Cyanosis: No Excoriation: No Ecchymosis: No Induration: No Erythema: No Rash: No Hemosiderin Staining: No Scarring: No Mottled: No Pallor: No Moisture Rubor: No No Abnormalities Noted: No Dry / Scaly: No Temperature / Pain Maceration: No Temperature: No Abnormality Tenderness on Palpation: Yes Wound Preparation Ulcer Cleansing: Rinsed/Irrigated with Saline Topical Anesthetic Applied: Other: lidocaine 4%, Treatment Notes Wound #9 (Left Elbow) 1. Cleansed with: Clean wound with Normal Saline 2. Anesthetic Topical Lidocaine 4% cream to wound bed prior to debridement  4. Dressing Applied: Prisma Ag 5. Secondary Dressing Applied Bordered Foam Dressing Telfa [place] Notes telfa island elbow BFD left lower leg Electronic Signature(s) Signed: 05/15/2018 5:22:13 PM By: Curtis Sites Entered By: Curtis Sites on 05/15/2018 09:02:27 [name] ([id number]) -------------------------------------------------------------------------------- Vitals Details Patient Name: [name] Date of Service: 05/15/2018 8:45 AM Medical Record Number: [id number] Patient Account Number: [id number] Date of Birth/Sex: [birth year] (84 y.o. F) Treating RN: Curtis Sites Primary Care Annely Sliva: Aram Beecham Other Clinician: Referring Hadlei Stitt:  Aram Beecham Treating Lakeena Downie/Extender: Linwood Dibbles, HOYT Weeks in Treatment: 26 Vital Signs Time Taken: 08:49 Temperature (F): 97.9 Height (in): 60 Pulse (bpm): 64 Weight (lbs): 123 Respiratory Rate (breaths/min): 16 Body Mass Index (BMI): 24 Blood Pressure (mmHg): 145/49 Reference Range: 80 - 120 mg / dl Electronic Signature(s) Signed: 05/15/2018 5:22:13 PM By: Curtis Sites Entered By: Curtis Sites on 05/15/2018 08:51:43

## 2018-05-16 NOTE — Progress Notes (Signed)
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 which obviously explain to the patient is not good. She will be having a follow-up with her orthopedic surgeon in the next couple of weeks. In the meantime we are trying to do as much as we can to try to show signs of improvement in healing to avoid the need for any additional and further surgery. Nonetheless I explained to the patient yet again today I'm not sure if that is going to be feasible or not obviously it's more risk for her to continue to have an open wound with bone exposure then to the back in for additional surgery even though I know she doesn't want to go that route. 05/15/18 on evaluation today patient presents for follow-up concerning her ongoing lower extremity ulcers on the left as well as the left elbow ulcer. She has at this point in time been tolerating the dressing changes without complication. Her left lower extremity ulcer appears to be doing very well. In regard to the left elbow ulcer she actually does seem to have additional granulation today which is  good news. I am definitely seeing signs of improvement although obviously this is somewhat slow improvement. Nonetheless I'm hopeful we will be able to avoid her having to have any further surgery but again that would definitely be a conversation between herself as well as her surgeon once he sees her for reevaluation. Otherwise she does want to see about having a three order compression stockings for her today Patient History Information obtained from Patient. Family History Cancer - Mother,Siblings, Diabetes - Father, Heart Disease - Father,Siblings, Hypertension - Father,Siblings, Lung Disease - Siblings, No family history of Hereditary Spherocytosis, Kidney Disease, Seizures, Stroke, Thyroid Problems, Tuberculosis. Social History Never smoker, Marital Status - Married, Alcohol Use - Never, Drug Use - No History, Caffeine Use - Never. Medical And Surgical History Notes Constitutional Symptoms (General Health) Back pain Ear/Nose/Mouth/Throat [name], [name] ([id number]) bilateral hearing aides Cardiovascular pacemaker Review of Systems (ROS) Constitutional Symptoms (General Health) Denies complaints or symptoms of Fever, Chills. Respiratory The patient has no complaints or symptoms. Cardiovascular Complains or has symptoms of LE edema. Psychiatric The  patient has no complaints or symptoms. Objective Constitutional Well-nourished and well-hydrated in no acute distress. Vitals Time Taken: 8:49 AM, Height: 60 in, Weight: 123 lbs, BMI: 24, Temperature: 97.9 F, Pulse: 64 bpm, Respiratory Rate: 16 breaths/min, Blood Pressure: 145/49 mmHg. Respiratory normal breathing without difficulty. clear to auscultation bilaterally. Cardiovascular regular rate and rhythm with normal S1, S2. Psychiatric this patient is able to make decisions and demonstrates good insight into disease process. Alert and Oriented x 3. pleasant and cooperative. General Notes: Currently patient's left lower  [place] ulcers did not require any sharp debridement today it appears that an excellent granulation bed there is no evidence of infection all of which is good news. In regard to the left elbow I do feel like there's additional granulation starting to appear in the base of the wound and obviously I'm pleased with this. My hope is that she will continue to show signs of improvement in regard to her elbow over the next weeks time. She does not require any additional debridement today. Integumentary (Hair, Skin) Wound #7 status is Healed - Epithelialized. Original cause of wound was Trauma. The wound is located on the Left,Proximal Lower Leg. The wound measures 0cm length x 0cm width x 0cm depth; 0cm^2 area and 0cm^3 volume. There is no tunneling or undermining noted. There is a none present amount of drainage noted. The wound margin is indistinct and nonvisible. There is no granulation within the wound bed. There is no necrotic tissue within the wound bed. The periwound skin appearance exhibited: Hemosiderin Staining. The periwound skin appearance did not exhibit: Callus, Crepitus, Excoriation, Induration, Rash, Scarring, Dry/Scaly, Maceration, Atrophie Blanche, Cyanosis, Ecchymosis, Mottled, Pallor, Rubor, Erythema. Periwound temperature was noted as No Abnormality. Wound #8 status is Open. Original cause of wound was Trauma. The wound is located on the Left,Distal Lower Leg. The wound measures 0.6cm length x 1.1cm width x 0.1cm depth; 0.518cm^2 area and 0.052cm^3 volume. There is Fat [name], [name] ([id number]) (Subcutaneous Tissue) Exposed exposed. There is no tunneling or undermining noted. There is a medium amount of serous drainage noted. The wound margin is flat and intact. There is medium (34-66%) red granulation within the wound bed. There is a medium (34-66%) amount of necrotic tissue within the wound bed including Adherent Slough. The periwound skin appearance exhibited: Ecchymosis,  Hemosiderin Staining. The periwound skin appearance did not exhibit: Callus, Crepitus, Excoriation, Induration, Rash, Scarring, Dry/Scaly, Maceration, Atrophie Blanche, Cyanosis, Mottled, Pallor, Rubor, Erythema. Periwound temperature was noted as No Abnormality. The periwound has tenderness on palpation. Wound #9 status is Open. Original cause of wound was Trauma. The wound is located on the Left Elbow. The wound measures 0.6cm length x 0.4cm width x 0.4cm depth; 0.188cm^2 area and 0.075cm^3 volume. There is bone and Fat Layer (Subcutaneous Tissue) Exposed exposed. There is no tunneling noted, however, there is undermining starting at 12:00 and ending at 12:00 with a maximum distance of 0.9cm. There is a large amount of serous drainage noted. The wound margin is distinct with the outline attached to the wound base. There is medium (34-66%) pink granulation within the wound bed. There is a medium (34-66%) amount of necrotic tissue within the wound bed including Adherent Slough. The periwound skin appearance did not exhibit: Callus, Crepitus, Excoriation, Induration, Rash, Scarring, Dry/Scaly, Maceration, Atrophie Blanche, Cyanosis, Ecchymosis, Hemosiderin Staining, Mottled, Pallor, Rubor, Erythema. Periwound temperature was noted as No Abnormality. The periwound has tenderness on palpation. 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 Wound #9 Left Elbow: Clean wound with Normal Saline. Anesthetic (add to Medication List): Wound #8 Left,Distal Lower Leg: Topical Lidocaine 4% cream  applied to wound bed prior to debridement (In Clinic Only). Benzocaine Topical Anesthetic Spray applied to wound bed prior to debridement (In Clinic Only). Wound #9 Left Elbow: Topical Lidocaine 4% cream applied to wound bed prior to debridement (In Clinic Only). Benzocaine Topical Anesthetic Spray applied to wound bed prior to debridement (In Clinic Only). Primary Wound Dressing: Wound #8 Left,Distal Lower Leg: Silver Collagen - using plain collagen in office Wound #9 Left Elbow: Silver Collagen - using plain collagen in office Secondary Dressing: Wound #8 Left,Distal Lower Leg: Boardered Foam Dressing Wound #9 Left Elbow: [name], [name] ([id number]) Boardered Foam Dressing - or t[place]a [place] Dressing Change Frequency: Wound #8 Left,Distal Lower Leg: Change dressing every other day. Wound #9 Left Elbow: Change dressing every other day. Follow-up Appointments: Wound #8 Left,Distal Lower Leg: Return Appointment in 1 week. Wound #9 Left Elbow: Return Appointment in 1 week. Edema Control: Wound #8 Left,Distal Lower Leg: Elevate legs to the level of the heart and pump ankles as often as possible Other: - perform ABI Additional Orders / Instructions: Wound #8 Left,Distal Lower Leg: Increase protein intake. Wound #9 Left Elbow: Increase protein intake. The following medication(s) was prescribed: lidocaine topical 4 % cream 1 1 cream topical was prescribed at facility I am going to suggest currently that we continue with the Current wound care measures for the next week. Patient is in agreement the plan. We will subsequently see were things stand in one weeks time. Hopefully things will show signs of improvement even better by that point so that hopefully will be able to avoid any additional surgery in regard to this left elbow. Please see above for specific wound care orders. We will see patient for re-evaluation in 1 week(s) here in the clinic. If anything worsens or changes  patient will contact our office for additional recommendations. Electronic Signature(s) Signed: 05/16/2018 8:38:06 AM By: Lenda Kelp PA-C Entered By: Lenda Kelp on 05/15/2018 09:35:21 [name] ([id number]) -------------------------------------------------------------------------------- ROS/PFSH Details Patient Name: [name] Date of Service: 05/15/2018 8:45 AM Medical Record Number: [id number] Patient Account Number: [id number] Date of Birth/Sex: December 02, 1930 (84 y.o. F) Treating RN: Phillis Haggis Primary Care Provider: Aram Beecham Other Clinician: Referring Provider: Aram Beecham Treating Provider/Extender: Linwood Dibbles, HOYT Weeks in Treatment: 26 Information Obtained From Patient Wound History Do you currently have one or more open woundso Yes How many open wounds do you currently haveo 1 Approximately how long have you had your woundso 10 weeks How have you been treating your wound(s) until nowo mupirocin Has your wound(s) ever healed and then re-openedo No Have you had any lab work done in the past montho No Have you tested positive for an antibiotic resistant organism (MRSA, VRE)o No Have you tested positive for osteomyelitis (bone infection)o No Have you had any tests for circulation on your legso Yes Who ordered the testo dr schnier [facility] test [facility] Have you had other problems associated with your woundso Swelling Constitutional Symptoms (General Health) Complaints and Symptoms: Negative for: Fever; Chills Medical  History: Past Medical History Notes: Back pain Cardiovascular Complaints and Symptoms: Positive for: LE edema Medical History: Positive for: Hypertension Negative for: Angina; Arrhythmia; Congestive Heart Failure; Coronary Artery Disease; Deep Vein Thrombosis; Hypotension; Myocardial Infarction; Peripheral Arterial Disease; Peripheral Venous Disease; Phlebitis; Vasculitis Past Medical History  Notes: pacemaker Eyes Medical History: Negative for: Cataracts; Glaucoma; Optic Neuritis Ear/Nose/Mouth/Throat Medical History: Negative for: Chronic sinus problems/congestion; Middle ear problems Past Medical History Notes: bilateral hearing aides [name], [name] ([id number]) Hematologic/Lymphatic Medical History: Negative for: Anemia; Hemophilia; Human Immunodeficiency Virus; Lymphedema; Sickle Cell Disease Respiratory Complaints and Symptoms: No Complaints or Symptoms Medical History: Negative for: Aspiration; Asthma; Chronic Obstructive Pulmonary Disease (COPD); Pneumothorax; Sleep Apnea; Tuberculosis Gastrointestinal Medical History: Negative for: Cirrhosis ; Colitis; Crohnos; Hepatitis A; Hepatitis B; Hepatitis C Endocrine Medical History: Negative for: Type I Diabetes; Type II Diabetes Genitourinary Medical History: Negative for: End Stage Renal Disease Immunological Medical History: Negative for: Lupus Erythematosus; Raynaudos; Scleroderma Musculoskeletal Medical History: Positive for: Gout; Osteoarthritis Negative for: Rheumatoid Arthritis; Osteomyelitis Neurologic Medical History: Positive for: Neuropathy - lower legs Negative for: Dementia; Quadriplegia; Paraplegia; Seizure Disorder Oncologic Medical History: Negative for: Received Chemotherapy; Received Radiation Psychiatric Complaints and Symptoms: No Complaints or Symptoms Medical History: Negative for: Anorexia/bulimia; Confinement Anxiety Immunizations [name], [name] ([id number]) Pneumococcal Vaccine: Received Pneumococcal Vaccination: Yes Implantable Devices Family and Social History Cancer: Yes - Mother,Siblings; Diabetes: Yes - Father; Heart Disease: Yes - Father,Siblings; Hereditary Spherocytosis: No; Hypertension: Yes - Father,Siblings; Kidney Disease: No; Lung Disease: Yes - Siblings; Seizures: No; Stroke: No; Thyroid Problems: No; Tuberculosis: No; Never smoker; Marital Status - Married;  Alcohol Use: Never; Drug Use: No History; Caffeine Use: Never; Financial Concerns: No; Food, Clothing or Shelter Needs: No; Support System Lacking: No; Transportation Concerns: No; Advanced Directives: No; Patient does not want information on Advanced Directives; Medical Power of Attorney: Yes - Khadeejah Castner (Copy provided) Physician Affirmation I have reviewed and agree with the above information. Electronic Signature(s) Signed: 05/15/2018 5:20:44 PM By: Alejandro Mulling Signed: 05/16/2018 8:38:06 AM By: Lenda Kelp PA-C Entered By: Lenda Kelp on 05/15/2018 09:33:50 [name] ([id number]) -------------------------------------------------------------------------------- SuperBill Details Patient Name: [name] Date of Service: 05/15/2018 Medical Record Number: [id number] Patient Account Number: [id number] Date of Birth/Sex: Nov 30, 1930 (84 y.o. 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 Facility Procedures CPT4 Code: 22979892 Description: 99214 - WOUND CARE VISIT-LEV 4 EST PT Modifier: Quantity: 1 Physician Procedures CPT4: Description Modifier Quantity Code [id number] 99213 - WC PHYS LEVEL 3 - EST PT 1 ICD-10 Diagnosis Description S81.811S Laceration without foreign body, right lower leg, sequela L97.822 Non-pressure chronic ulcer of other part of left lower leg with  fat layer exposed T81.31XA Disruption of  external operation (surgical) wound, not elsewhere classified, initial encounter L98.495 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

## 2018-05-21 ENCOUNTER — Encounter: Payer: Medicare Other | Admitting: Physician Assistant

## 2018-05-21 DIAGNOSIS — L97822 Non-pressure chronic ulcer of other part of left lower leg with fat layer exposed: Secondary | ICD-10-CM | POA: Diagnosis not present

## 2018-05-24 NOTE — Progress Notes (Signed)
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 elbow ulcer she actually does seem to have additional granulation today which is good news. I am definitely seeing signs of improvement although obviously this is somewhat slow improvement. Nonetheless I'm hopeful we will be able to avoid her having to have any further surgery but again that would definitely be a conversation between herself as well as her surgeon once he sees her for reevaluation. Otherwise she does want to see about having a three order compression stockings for her today 05/21/18 on evaluation today patient appears to be doing well in regard to her left lower surety ulcer. This is almost completely healed and seems to be progressing very nicely. With that being said her left elbow is another story. I'm not really convinced in the past three weeks we've seen a significant improvement in this wound. With that being said if this is something that there is no surgical option for him we have to continue to work on this from the standpoint of conservative management with wound care she may make improvement given time.  Nonetheless it appears that her surgeon is somewhat concerned about the possibility of infection and really is leaning towards additional surgery to try and help close this wound. Nonetheless the patient is still unsure of exactly what to do. Patient History Information obtained from Patient. Family History Cancer - Mother,Siblings, Diabetes - Father, Heart Disease - Father,Siblings, Hypertension - Father,Siblings, Lung Disease - Siblings, No family history of Hereditary Spherocytosis, Kidney Disease, Seizures, Stroke, Thyroid Problems, Tuberculosis. AYJAH, SHOW ([id number]) Social History Never smoker, Marital Status - Married, Alcohol Use - Never,  Drug Use - No History, Caffeine Use - Never. Medical And Surgical History Notes Constitutional Symptoms (General Health) Back pain Ear/Nose/Mouth/Throat bilateral hearing aides Cardiovascular pacemaker Review of Systems (ROS) Constitutional Symptoms (General Health) Denies complaints or symptoms of Fever, Chills. Respiratory The patient has no complaints or symptoms. Cardiovascular Complains or has symptoms of LE edema. Psychiatric The patient has no complaints or symptoms. Objective Constitutional Well-nourished and well-hydrated in no acute distress. Vitals Time Taken: 3:53 PM, Height: 60 in, Weight: 123 lbs, BMI: 24, Temperature: 97.6 F, Pulse: 68 bpm, Respiratory Rate: 16 breaths/min, Blood Pressure: 136/50 mmHg. Respiratory normal breathing without difficulty. clear to auscultation bilaterally. Cardiovascular regular rate and rhythm with normal S1, S2. trace pitting edema of the bilateral lower extremities. Psychiatric this patient is able to make decisions and demonstrates good insight into disease process. Alert and Oriented x 3. pleasant and cooperative. General Notes: I am concerned that this point again about the patient's left elbow wound I'm still fine significant undermining at this point there does not appear  to be a much in the way of additional granulation at this time. With that being said she does not have erythema as was noted previous and it's possible that this may continue to show signs of improvement given time. I do not think three weeks is going to be sufficient which is the amount of time we had before her appointment with her surgeon this Friday in order to allow this to heal. Integumentary (Hair, Skin) Wound #8 status is Open. Original cause of wound was Trauma. The wound is located on the Left,Distal Lower Leg. The wound measures 0.5cm length x 1cm width x 0.1cm depth; 0.393cm^2 area and 0.039cm^3 volume. KENZLEI, RUNIONS ([id number]) Wound #9 status is Open. Original cause of wound was Trauma. The wound is located on the Left Elbow. The wound measures 0.5cm length x 0.3cm width x 0.3cm depth; 0.118cm^2 area and 0.035cm^3 volume. There is bone and Fat Layer (Subcutaneous Tissue) Exposed exposed. There is no tunneling noted, however, there is undermining starting at 1:00 and ending at 1:00 with a maximum distance of 1.4cm. There is a large amount of serous drainage noted. The wound margin is distinct with the outline attached to the wound base. There is medium (34-66%) pink granulation within the wound bed. There is a medium (34-66%) amount of necrotic tissue within the wound bed including Adherent Slough. The periwound skin appearance did not exhibit: Callus, Crepitus, Excoriation, Induration, Rash, Scarring, Dry/Scaly, Maceration, Atrophie Blanche, Cyanosis, Ecchymosis, Hemosiderin Staining, Mottled, Pallor, Rubor, Erythema. Periwound temperature was noted as No Abnormality. The periwound has tenderness on palpation. 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 Wound #9 Left Elbow: Clean wound with Normal Saline. Anesthetic (add to Medication List): Wound #8 Left,Distal Lower Leg: Topical Lidocaine 4% cream applied to wound bed prior to debridement (In Clinic Only). Benzocaine Topical Anesthetic Spray applied to wound bed prior to debridement (In Clinic Only). Wound #9 Left Elbow: Topical Lidocaine 4% cream applied to wound bed prior to debridement (In Clinic Only). Primary Wound Dressing: Wound #8 Left,Distal Lower Leg: Silver Collagen - using plain collagen in office Wound #9 Left Elbow: Silver  Collagen - using plain collagen in office Secondary Dressing: Wound #8 Left,Distal Lower Leg: Boardered Foam Dressing Wound #9 Left Elbow: Boardered Foam Dressing - or [place] Dressing Change Frequency: Wound #8 Left,Distal Lower Leg: Change dressing every other day. Wound #9 Left Elbow: Change dressing every other day. Follow-up Appointments: ADAISHA, CAMPISE ([id number]) Wound #8 Left,Distal Lower Leg: Return Appointment in 1 week. Wound #9 Left Elbow: Return Appointment in 1 week. Edema Control: Wound #8 Left,Distal Lower Leg: Elevate legs to the level of the heart and pump ankles as often as possible Other: - perform ABI Additional Orders / Instructions: Wound #8 Left,Distal Lower Leg: Increase protein intake. Wound #9 Left Elbow: Increase protein intake. Currently I did have a rather lengthy conversation with patient concerning her wounds and the fact that I do believe that she potentially could see some signs of improvement although I'm not certain that this will be significant or even rapid. She understands. With that being said she really does not seem sure that she wants to undergo another surgery in regard to her left elbow.  Again I can completely understand this as well. This is a very difficult situation and it's hard for me to tell her exactly what to do as again it's hard to know what the outcome will be. She understands. Nonetheless we will see her back for reevaluation in one weeks time. In the interim she will be seeing her orthopedic surgeon. I do think she could get a second opinion from a plastic surgeon if she would like. She let me know what she wants to do at follow-up. Please see above for specific wound care orders. We will see patient for re-evaluation in 1 week(s) here in the clinic. If anything worsens or changes patient will contact our office for additional recommendations. Electronic Signature(s) Signed: 05/21/2018 11:45:54 PM By: Lenda Kelp PA-C Entered By: Lenda Kelp on 05/21/2018 17:20:35 [name] ([id number]) -------------------------------------------------------------------------------- ROS/PFSH Details Patient Name: [name] Date of Service: 05/21/2018 3:45 PM Medical Record Number: [id number] Patient Account Number: [id number] Date of Birth/Sex: [birth year] (84 y.o. F) Treating RN: Renne Crigler Primary Care Provider: Aram Beecham Other Clinician: Referring Provider: Aram Beecham Treating Provider/Extender: Linwood Dibbles, HOYT Weeks in Treatment: 26 Information Obtained From Patient Wound History Do you currently have one or more open woundso Yes How many open wounds do you currently haveo 1 Approximately how long have you had your woundso 10 weeks How have you been treating your wound(s) until nowo mupirocin Has your wound(s) ever healed and then re-openedo No Have you had any lab work done in the past montho No Have you tested positive for an antibiotic resistant organism (MRSA, VRE)o No Have you tested positive for osteomyelitis (bone infection)o No Have you had any tests for circulation on your legso Yes Who ordered the testo dr schnier Where was  [facility] test [facility] Have you had other problems associated with your woundso Swelling Constitutional Symptoms (General Health) Complaints and Symptoms: Negative for: Fever; Chills Medical History: Past Medical History Notes: Back pain Cardiovascular Complaints and Symptoms: Positive for: LE edema Medical History: Positive for: Hypertension Negative for: Angina; Arrhythmia; Congestive Heart Failure; Coronary Artery Disease; Deep Vein Thrombosis; Hypotension; Myocardial Infarction; Peripheral Arterial Disease; Peripheral Venous Disease; Phlebitis; Vasculitis Past Medical History Notes: pacemaker Eyes Medical History: Negative for: Cataracts; Glaucoma; Optic Neuritis Ear/Nose/Mouth/Throat Medical History: Negative for: Chronic sinus problems/congestion; Middle ear problems Past Medical History Notes: bilateral hearing aides EVAROSE, ALTLAND ([id number]) Hematologic/Lymphatic  Medical History: Negative for: Anemia; Hemophilia; Human Immunodeficiency Virus; Lymphedema; Sickle Cell Disease Respiratory Complaints and Symptoms: No Complaints or Symptoms Medical History: Negative for: Aspiration; Asthma; Chronic Obstructive Pulmonary Disease (COPD); Pneumothorax; Sleep Apnea; Tuberculosis Gastrointestinal Medical History: Negative for: Cirrhosis ; Colitis; Crohnos; Hepatitis A; Hepatitis B; Hepatitis C Endocrine Medical History: Negative for: Type I Diabetes; Type II Diabetes Genitourinary Medical History: Negative for: End Stage Renal Disease Immunological Medical History: Negative for: Lupus Erythematosus; Raynaudos; Scleroderma Musculoskeletal Medical History: Positive for: Gout; Osteoarthritis Negative for: Rheumatoid Arthritis; Osteomyelitis Neurologic Medical History: Positive for: Neuropathy - lower legs Negative for: Dementia; Quadriplegia; Paraplegia; Seizure Disorder Oncologic Medical History: Negative for: Received Chemotherapy; Received  Radiation Psychiatric Complaints and Symptoms: No Complaints or Symptoms Medical History: Negative for: Anorexia/bulimia; Confinement Anxiety Immunizations CARMON, AGREDANO ([id number]) Pneumococcal Vaccine: Received Pneumococcal Vaccination: Yes Implantable Devices Family and Social History Cancer: Yes - Mother,Siblings; Diabetes: Yes - Father; Heart Disease: Yes - Father,Siblings; Hereditary Spherocytosis: No; Hypertension: Yes - Father,Siblings; Kidney Disease: No; Lung Disease: Yes - Siblings; Seizures: No; Stroke: No; Thyroid Problems: No; Tuberculosis: No; Never smoker; Marital Status - Married; Alcohol Use: Never; Drug Use: No History; Caffeine Use: Never; Financial Concerns: No; Food, Clothing or Shelter Needs: No; Support System Lacking: No; Transportation Concerns: No; Advanced Directives: No; Patient does not want information on Advanced Directives; Medical Power of Attorney: Yes - Shanitha Bartnik (Copy provided) Physician Affirmation I have reviewed and agree with the above information. Electronic Signature(s) Signed: 05/21/2018 11:45:54 PM By: Lenda Kelp PA-C Signed: 05/22/2018 11:44:07 AM By: Renne Crigler Entered By: Lenda Kelp on 05/21/2018 17:19:07 [name] ([id number]) -------------------------------------------------------------------------------- SuperBill Details Patient Name: [name] Date of Service: 05/21/2018 Medical Record Number: [id number] Patient Account Number: [id number] Date of Birth/Sex: [birth year] (84 y.o. 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 Facility Procedures CPT4 Code: 42353614 Description: 99213 - WOUND CARE VISIT-LEV 3 EST PT Modifier: Quantity: 1 Physician Procedures CPT4: Description Modifier Quantity Code [id number] 99213 - WC PHYS LEVEL 3 - EST PT 1 ICD-10 Diagnosis Description S81.811S Laceration without foreign body, right lower leg, sequela L97.822 Non-pressure chronic ulcer of other part of left lower leg with  fat layer exposed T81.31XA Disruption of external operation (surgical) wound, not elsewhere classified, initial encounter L98.495 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

## 2018-05-24 NOTE — Progress Notes (Signed)
[name], [name] ([id number]) Visit Report for 05/21/2018 Arrival Information Details Patient Name: [name], [name] Date of Service: 05/21/2018 3:45 PM Medical Record Number: [id number] Patient Account Number: [id number] Date of Birth/Sex: Apr 19, 1930 (84 y.o. F) Treating RN: [name] Primary Care [name]: [name] Other Clinician: Referring [name]: [name] Treating [name]/Extender: [name], [name] in Treatment: 26 Visit Information History Since Last Visit Added or deleted any medications: No Patient Arrived: Walker Any new allergies or adverse reactions: No Arrival Time: 15:50 Had a fall or experienced change in No Accompanied By: self activities of daily living that may affect Transfer Assistance: None risk of falls: Patient Identification Verified: Yes Signs or symptoms of abuse/neglect since last visito No Secondary Verification Process Yes Hospitalized since last visit: No Completed: Implantable device outside of the clinic excluding No Patient Requires Transmission-Based No cellular tissue based products placed in the center Precautions: since last visit: Patient Has Alerts: Yes Has Dressing in Place as Prescribed: Yes Patient Alerts: non compressible left Pain Present Now: No leg Electronic Signature(s) Signed: 05/21/2018 5:19:07 PM By: [name] Entered By: [name] on 05/21/2018 15:51:50 [name] ([id number]) -------------------------------------------------------------------------------- Clinic Level of Care Assessment Details Patient Name: [name] Date of Service: 05/21/2018 3:45 PM Medical Record Number: [id number] Patient Account Number: [id number] Date of Birth/Sex: Jan 01, 1930 (84 y.o. F) Treating RN: [name] Primary Care Kaniya Trueheart: [name] Other Clinician: Referring [name]: [name] Treating [name]/Extender: [name], [name] in Treatment: 26 Clinic Level of Care  Assessment Items TOOL 4 Quantity Score X - Use when only an EandM is performed on FOLLOW-UP visit 1 0 ASSESSMENTS - Nursing Assessment / Reassessment X - Reassessment of Co-morbidities (includes updates in patient status) 1 10 X- 1 5 Reassessment of Adherence to Treatment Plan ASSESSMENTS - Wound and Skin Assessment / Reassessment X - Simple Wound Assessment / Reassessment - one wound 1 5 []  - 0 Complex Wound Assessment / Reassessment - multiple wounds []  - 0 Dermatologic / Skin Assessment (not related to wound area) ASSESSMENTS - Focused Assessment []  - Circumferential Edema Measurements - multi extremities 0 []  - 0 Nutritional Assessment / Counseling / Intervention []  - 0 Lower Extremity Assessment (monofilament, tuning fork, pulses) []  - 0 Peripheral Arterial Disease Assessment (using hand held doppler) ASSESSMENTS - Ostomy and/or Continence Assessment and Care []  - Incontinence Assessment and Management 0 []  - 0 Ostomy Care Assessment and Management (repouching, etc.) PROCESS - Coordination of Care X - Simple Patient / Family Education for ongoing care 1 15 []  - 0 Complex (extensive) Patient / Family Education for ongoing care []  - 0 Staff obtains Chiropractor, Records, Test Results / Process Orders []  - 0 Staff telephones HHA, Nursing Homes / Clarify orders / etc []  - 0 Routine Transfer to another Facility (non-emergent condition) []  - 0 Routine Hospital Admission (non-emergent condition) []  - 0 New Admissions / Manufacturing engineer / Ordering NPWT, Apligraf, etc. []  - 0 Emergency Hospital Admission (emergent condition) []  - 0 Simple Discharge Coordination [name], MORNING ([id number]) []  - 0 Complex (extensive) Discharge Coordination PROCESS - Special Needs []  - Pediatric / Minor Patient Management 0 []  - 0 Isolation Patient Management []  - 0 Hearing / Language / Visual special needs []  - 0 Assessment of Community assistance (transportation, D/C planning,  etc.) []  - 0 Additional assistance / Altered mentation []  - 0 Support Surface(s) Assessment (bed, cushion, seat, etc.) INTERVENTIONS - Wound Cleansing / Measurement []  - Simple Wound Cleansing - one wound  0 X- 2 5 Complex Wound Cleansing - multiple wounds X- 1 5 Wound Imaging (photographs - any number of wounds) []  - 0 Wound Tracing (instead of photographs) []  - 0 Simple Wound Measurement - one wound X- 2 5 Complex Wound Measurement - multiple wounds INTERVENTIONS - Wound Dressings X - Small Wound Dressing one or multiple wounds 2 10 []  - 0 Medium Wound Dressing one or multiple wounds []  - 0 Large Wound Dressing one or multiple wounds []  - 0 Application of Medications - topical []  - 0 Application of Medications - injection INTERVENTIONS - Miscellaneous []  - External ear exam 0 []  - 0 Specimen Collection (cultures, biopsies, blood, body fluids, etc.) []  - 0 Specimen(s) / Culture(s) sent or taken to Lab for analysis []  - 0 Patient Transfer (multiple staff / Nurse, adult / Similar devices) []  - 0 Simple Staple / Suture removal (25 or less) []  - 0 Complex Staple / Suture removal (26 or more) []  - 0 Hypo / Hyperglycemic Management (close monitor of Blood Glucose) []  - 0 Ankle / Brachial Index (ABI) - do not check if billed separately X- 1 5 Vital Signs [name], [name] ([id number]) Has the patient been seen at the hospital within the last three years: Yes Total Score: 85 Level Of Care: New/Established - Level 3 Electronic Signature(s) Signed: 05/22/2018 11:44:07 AM By: [name] Entered By: [name] on 05/21/2018 16:22:46 [name] ([id number]) -------------------------------------------------------------------------------- Encounter Discharge Information Details Patient Name: [name] Date of Service: 05/21/2018 3:45 PM Medical Record Number: [id number] Patient Account Number: [id number] Date of Birth/Sex: 1930-07-06 (84 y.o. F) Treating  RN: [name] Primary Care Hall Birchard: [name] Other Clinician: Referring Neill Jurewicz: [name] Treating [name]/Extender: [name], [name] in Treatment: 81 Encounter Discharge Information Items Discharge Condition: Stable Ambulatory Status: Walker Discharge Destination: Home Transportation: Private Auto Accompanied By: self Schedule Follow-up Appointment: Yes Clinical Summary of Care: Electronic Signature(s) Signed: 05/21/2018 4:38:51 PM By: Alejandro Mulling Entered By: Alejandro Mulling on 05/21/2018 16:26:21 [name] ([id number]) -------------------------------------------------------------------------------- Lower Extremity Assessment Details Patient Name: [name] Date of Service: 05/21/2018 3:45 PM Medical Record Number: [id number] Patient Account Number: [id number] Date of Birth/Sex: 23-Oct-1930 (84 y.o. F) Treating RN: [name] Primary Care Sheronica Corey: [name] Other Clinician: Referring Marquavis Hannen: [name] Treating Kaelyn Nauta/Extender: [name], [name] in Treatment: 26 Edema Assessment Assessed: [Left: No] [Right: No] [Left: Edema] [Right: :] Calf Left: Right: Point of Measurement: 30 cm From Medial Instep 32.2 cm cm Ankle Left: Right: Point of Measurement: 10 cm From Medial Instep 22.5 cm cm Vascular Assessment Pulses: Dorsalis Pedis Palpable: [Left:Yes] Posterior Tibial Extremity colors, hair growth, and conditions: Extremity Color: [Left:Hyperpigmented] Hair Growth on Extremity: [Left:No] Temperature of Extremity: [Left:Warm] Capillary Refill: [Left:< 3 seconds] Toe Nail Assessment Left: Right: Thick: Yes Discolored: Yes Deformed: No Improper Length and Hygiene: No Electronic Signature(s) Signed: 05/21/2018 5:19:07 PM By: [name] Entered By: [name] on 05/21/2018 15:59:56 [name]  ([id number]) -------------------------------------------------------------------------------- Multi Wound Chart Details Patient Name: [name] Date of Service: 05/21/2018 3:45 PM Medical Record Number: [id number] Patient Account Number: [id number] Date of Birth/Sex: Jun 26, 1930 (84 y.o. F) Treating RN: [name] Primary Care Dennison Mcdaid: [name] Other Clinician: Referring Naya Ilagan: [name] Treating Jonanthony Nahar/Extender: [name], [name] in Treatment: 26 Vital Signs Height(in): 60 Pulse(bpm): 68 Weight(lbs): 123 Blood Pressure(mmHg): 136/50 Body Mass Index(BMI): 24 Temperature(F): 97.6 Respiratory Rate 16 (breaths/min): Photos: [8:No Photos] [9:No Photos] [N/A:N/A] Wound Location: [8:Left, Distal Lower Leg] [  9:Left Elbow] [N/A:N/A] Wounding Event: [8:Trauma] [9:Trauma] [N/A:N/A] Primary Etiology: [8:Trauma, Other] [9:Trauma, Other] [N/A:N/A] Comorbid History: [8:N/A] [9:Hypertension, Gout, Osteoarthritis, Neuropathy] [N/A:N/A] Date Acquired: [8:03/22/2018] [9:01/26/2018] [N/A:N/A] Barnett of Treatment: [8:8] [9:3] [N/A:N/A] Wound Status: [8:Open] [9:Open] [N/A:N/A] Measurements L x W x D [8:0.5x1x0.1] [9:0.5x0.3x0.3] [N/A:N/A] (cm) Area (cm) : [8:0.393] [9:0.118] [N/A:N/A] Volume (cm) : [8:0.039] [9:0.035] [N/A:N/A] % Reduction in Area: [8:68.70%] [9:-66.20%] [N/A:N/A] % Reduction in Volume: [8:69.00%] [9:-150.00%] [N/A:N/A] Starting Position 1 [9:1] (o'clock): Ending Position 1 [9:1] (o'clock): Maximum Distance 1 (cm): [9:1.4] Undermining: [8:N/A] [9:Yes] [N/A:N/A] Classification: [8:Partial Thickness] [9:Full Thickness With Exposed Support Structures] [N/A:N/A] Exudate Amount: [8:N/A] [9:Large] [N/A:N/A] Exudate Type: [8:N/A] [9:Serous] [N/A:N/A] Exudate Color: [8:N/A] [9:amber] [N/A:N/A] Wound Margin: [8:N/A] [9:Distinct, outline attached] [N/A:N/A] Granulation Amount: [8:N/A] [9:Medium (34-66%)] [N/A:N/A] Granulation Quality: [8:N/A]  [9:Pink] [N/A:N/A] Necrotic Amount: [8:N/A] [9:Medium (34-66%)] [N/A:N/A] Epithelialization: [8:N/A] [9:None] [N/A:N/A] Periwound Skin Texture: [8:No Abnormalities Noted] [9:Excoriation: No Induration: No Callus: No Crepitus: No Rash: No Scarring: No] [N/A:N/A] Periwound Skin Moisture: No Abnormalities Noted Maceration: No N/A Dry/Scaly: No Periwound Skin Color: No Abnormalities Noted Atrophie Blanche: No N/A Cyanosis: No Ecchymosis: No Erythema: No Hemosiderin Staining: No Mottled: No Pallor: No Rubor: No Temperature: N/A No Abnormality N/A Tenderness on Palpation: No Yes N/A Wound Preparation: N/A Ulcer Cleansing: N/A Rinsed/Irrigated with Saline Topical Anesthetic Applied: Other: lidocaine 4% Treatment Notes Electronic Signature(s) Signed: 05/22/2018 11:44:07 AM By: [name] Entered By: [name] on 05/21/2018 16:12:15 [name] ([id number]) -------------------------------------------------------------------------------- Multi-Disciplinary Care Plan Details Patient Name: [name] Date of Service: 05/21/2018 3:45 PM Medical Record Number: [id number] Patient Account Number: [id number] Date of Birth/Sex: [birth year] (84 y.o. F) Treating RN: [name] Primary Care Westin Knotts: [name] Other Clinician: Referring Maddisen Vought: [name] Treating Caylan Schifano/Extender: [name], [name] in Treatment: 11 Active Inactive ` Orientation to the Wound Care Program Nursing Diagnoses: Knowledge deficit related to the wound healing center program Goals: Patient/caregiver will verbalize understanding of the Wound Healing Center Program Date Initiated: 11/14/2017 Target Resolution Date: 11/28/2017 Goal Status: Active Interventions: Provide education on orientation to the wound center Notes: ` Wound/Skin Impairment Nursing Diagnoses: Impaired tissue integrity Knowledge deficit related to ulceration/compromised skin  integrity Goals: Patient/caregiver will verbalize understanding of skin care regimen Date Initiated: 11/14/2017 Target Resolution Date: 11/28/2017 Goal Status: Active Ulcer/skin breakdown will have a volume reduction of 30% by week 4 Date Initiated: 11/14/2017 Target Resolution Date: 11/28/2017 Goal Status: Active Interventions: Assess patient/caregiver ability to obtain necessary supplies Assess patient/caregiver ability to perform ulcer/skin care regimen upon admission and as needed Assess ulceration(s) every visit Treatment Activities: Skin care regimen initiated : 11/14/2017 Notes: Electronic Signature(s) Signed: 05/22/2018 11:44:07 AM By: Lenore Cordia, Lawernce Keas ([id number]) Entered By: [name] on 05/21/2018 16:11:57 [name] ([id number]) -------------------------------------------------------------------------------- Pain Assessment Details Patient Name: [name] Date of Service: 05/21/2018 3:45 PM Medical Record Number: [id number] Patient Account Number: [id number] Date of Birth/Sex: 11-09-1930 (84 y.o. F) Treating RN: [name] Primary Care Gerhard Rappaport: [name] Other Clinician: Referring Marsela Kuan: [name] Treating Qusai Kem/Extender: [name], [name] in Treatment: 26 Active Problems Location of Pain Severity and Description of Pain Patient Has Paino No Site Locations Pain Management and Medication Current Pain Management: Electronic Signature(s) Signed: 05/21/2018 5:19:07 PM By: [name] Entered By: [name] on 05/21/2018 15:51:57 [name] ([id number]) -------------------------------------------------------------------------------- Patient/Caregiver Education Details Patient Name: [name] Date of Service: 05/21/2018 3:45 PM Medical Record Number: [id number] Patient Account Number: [id number] Date of Birth/Gender: [birth year] (84 y.o. F) Treating  RN: [name] Primary Care Physician:  [name] Other Clinician: Referring Physician: Aram Barnett Treating Physician/Extender: Skeet Simmer in Treatment: 89 Education Assessment Education Provided To: Patient Education Topics Provided Wound/Skin Impairment: Handouts: Caring for Your Ulcer, Skin Care Do's and Dont's, Other: change dressing as ordered Methods: Demonstration, Explain/Verbal Responses: State content correctly Electronic Signature(s) Signed: 05/21/2018 4:38:51 PM By: Alejandro Mulling Entered By: Alejandro Mulling on 05/21/2018 16:26:45 [name] ([id number]) -------------------------------------------------------------------------------- Wound Assessment Details Patient Name: [name] Date of Service: 05/21/2018 3:45 PM Medical Record Number: [id number] Patient Account Number: [id number] Date of Birth/Sex: 03-Sep-1930 (84 y.o. F) Treating RN: [name] Primary Care Kriston Mckinnie: [name] Other Clinician: Referring Kloey Cazarez: [name] Treating Lydiann Bonifas/Extender: [name], [name] in Treatment: 26 Wound Status Wound Number: 8 Primary Etiology: Trauma, Other Wound Location: Left, Distal Lower Leg Wound Status: Open Wounding Event: Trauma Date Acquired: 03/22/2018 Barnett Of Treatment: 8 Clustered Wound: No Photos Photo Uploaded By: [name] on 05/21/2018 17:18:43 Wound Measurements Length: (cm) 0.5 Width: (cm) 1 Depth: (cm) 0.1 Area: (cm) 0.393 Volume: (cm) 0.039 % Reduction in Area: 68.7% % Reduction in Volume: 69% Wound Description Classification: Partial Thickness Periwound Skin Texture Texture Color No Abnormalities Noted: No No Abnormalities Noted: No Moisture No Abnormalities Noted: No Treatment Notes Wound #8 (Left, Distal Lower Leg) 1. Cleansed with: Clean wound with Normal Saline 2. Anesthetic Topical Lidocaine 4% cream to wound bed prior to debridement 4. Dressing Applied: TELECIA, LAROCQUE ([id number]) Prisma Ag 5.  Secondary Dressing Applied Bordered Foam Dressing Notes telfa island elbow BFD left lower leg Electronic Signature(s) Signed: 05/21/2018 5:19:07 PM By: [name] Entered By: [name] on 05/21/2018 15:55:56 [name] ([id number]) -------------------------------------------------------------------------------- Wound Assessment Details Patient Name: [name] Date of Service: 05/21/2018 3:45 PM Medical Record Number: [id number] Patient Account Number: [id number] Date of Birth/Sex: 1930-02-04 (84 y.o. F) Treating RN: [name] Primary Care Anistyn Graddy: [name] Other Clinician: Referring Charelle Petrakis: [name] Treating Anand Tejada/Extender: [name], [name] in Treatment: 26 Wound Status Wound Number: 9 Primary Etiology: Trauma, Other Wound Location: Left Elbow Wound Status: Open Wounding Event: Trauma Comorbid Hypertension, Gout, Osteoarthritis, History: Neuropathy Date Acquired: 01/26/2018 Barnett Of Treatment: 3 Clustered Wound: No Photos Photo Uploaded By: [name] on 05/21/2018 17:18:44 Wound Measurements Length: (cm) 0.5 % Redu Width: (cm) 0.3 % Redu Depth: (cm) 0.3 Epithe Area: (cm) 0.118 Tunne Volume: (cm) 0.035 Under Sta End Max ction in Area: -66.2% ction in Volume: -150% lialization: None ling: No mining: Yes rting Position (o'clock): 1 ing Position (o'clock): 1 imum Distance: (cm) 1.4 Wound Description Full Thickness With Exposed Support Foul Classification: Structures Sloug Wound Margin: Distinct, outline attached Exudate Large Amount: Exudate Type: Serous Exudate Color: amber Odor After Cleansing: No h/Fibrino Yes Wound Bed Granulation Amount: Medium (34-66%) Exposed Structure Granulation Quality: Pink Fascia Exposed: No Necrotic Amount: Medium (34-66%) Fat Layer (Subcutaneous Tissue) Exposed: Yes NAYLEA, WIGINGTON ([id number]) Necrotic Quality: Adherent Slough Tendon Exposed: No Muscle Exposed:  No Joint Exposed: No Bone Exposed: Yes Periwound Skin Texture Texture Color No Abnormalities Noted: No No Abnormalities Noted: No Callus: No Atrophie Blanche: No Crepitus: No Cyanosis: No Excoriation: No Ecchymosis: No Induration: No Erythema: No Rash: No Hemosiderin Staining: No Scarring: No Mottled: No Pallor: No Moisture Rubor: No No Abnormalities Noted: No Dry / Scaly: No Temperature / Pain Maceration: No Temperature: No Abnormality Tenderness on Palpation: Yes Wound Preparation Ulcer Cleansing: Rinsed/Irrigated with Saline Topical Anesthetic Applied: Other: lidocaine 4%, Treatment Notes  Wound #9 (Left Elbow) 1. Cleansed with: Clean wound with Normal Saline 2. Anesthetic Topical Lidocaine 4% cream to wound bed prior to debridement 4. Dressing Applied: Prisma Ag 5. Secondary Dressing Applied Telfa [place] Notes telfa island elbow BFD left lower leg Electronic Signature(s) Signed: 05/21/2018 5:19:07 PM By: [name] Entered By: [name] on 05/21/2018 15:59:30 [name] ([id number]) -------------------------------------------------------------------------------- Vitals Details Patient Name: [name] Date of Service: 05/21/2018 3:45 PM Medical Record Number: [id number] Patient Account Number: [id number] Date of Birth/Sex: [birth year] (84 y.o. F) Treating RN: [name] Primary Care Tianah Lonardo: [name] Other Clinician: Referring Esaiah Wanless: [name] Treating Wilferd Ritson/Extender: [name], [name] in Treatment: 26 Vital Signs Time Taken: 15:53 Temperature (F): 97.6 Height (in): 60 Pulse (bpm): 68 Weight (lbs): 123 Respiratory Rate (breaths/min): 16 Body Mass Index (BMI): 24 Blood Pressure (mmHg): 136/50 Reference Range: 80 - 120 mg / dl Electronic Signature(s) Signed: 05/21/2018 5:19:07 PM By: [name] Entered By: [name] on 05/21/2018 15:53:55

## 2018-05-29 ENCOUNTER — Encounter: Payer: Medicare Other | Admitting: Physician Assistant

## 2018-05-29 DIAGNOSIS — L97822 Non-pressure chronic ulcer of other part of left lower leg with fat layer exposed: Secondary | ICD-10-CM | POA: Diagnosis not present

## 2018-06-01 ENCOUNTER — Encounter
Admission: RE | Admit: 2018-06-01 | Discharge: 2018-06-01 | Disposition: A | Discharge status: Home / Self Care | Payer: Medicare Other | Source: Ambulatory Visit | Attending: Surgery | Admitting: Surgery

## 2018-06-01 ENCOUNTER — Other Ambulatory Visit: Payer: Self-pay

## 2018-06-01 DIAGNOSIS — M71522 Other bursitis, not elsewhere classified, left elbow: Secondary | ICD-10-CM | POA: Insufficient documentation

## 2018-06-01 DIAGNOSIS — Z01812 Encounter for preprocedural laboratory examination: Secondary | ICD-10-CM | POA: Diagnosis present

## 2018-06-01 HISTORY — DX: Presence of cardiac pacemaker: Z95.0

## 2018-06-01 LAB — CBC
HCT: 32.8 % — ABNORMAL LOW (ref 35.0–47.0)
Hemoglobin: 11.7 g/dL — ABNORMAL LOW (ref 12.0–16.0)
MCH: 38.1 pg — AB (ref 26.0–34.0)
MCHC: 35.6 g/dL (ref 32.0–36.0)
MCV: 107.1 fL — AB (ref 80.0–100.0)
PLATELETS: 274 10*3/uL (ref 150–440)
RBC: 3.07 MIL/uL — AB (ref 3.80–5.20)
RDW: 13.8 % (ref 11.5–14.5)
WBC: 5.3 10*3/uL (ref 3.6–11.0)

## 2018-06-01 LAB — BASIC METABOLIC PANEL
Anion gap: 7 (ref 5–15)
BUN: 23 mg/dL — AB (ref 6–20)
CO2: 27 mmol/L (ref 22–32)
Calcium: 9.4 mg/dL (ref 8.9–10.3)
Chloride: 100 mmol/L — ABNORMAL LOW (ref 101–111)
Creatinine, Ser: 0.98 mg/dL (ref 0.44–1.00)
GFR calc Af Amer: 59 mL/min — ABNORMAL LOW (ref >60)
GFR, EST NON AFRICAN AMERICAN: 51 mL/min — AB (ref >60)
GLUCOSE: 98 mg/dL (ref 65–99)
POTASSIUM: 4.2 mmol/L (ref 3.5–5.1)
Sodium: 134 mmol/L — ABNORMAL LOW (ref 135–145)

## 2018-06-01 NOTE — Patient Instructions (Signed)
Your procedure is scheduled on: Tuesday June 05, 2018 Report to Same Day Surgery 2nd floor medical mall (Medical Mall Entrance-take elevator on left to 2nd floor.  Check in with surgery information desk.) To find out your arrival time please call [phone] between 1PM - 3PM on June 04, 2018  Remember: Instructions that are not followed completely may result in serious medical risk, up to and including death, or upon the discretion of your surgeon and anesthesiologist your surgery may need to be rescheduled.    _x___ 1. Do not eat food after midnight the night before your procedure. You may drink clear liquids up to 2 hours before you are scheduled to arrive at the hospital for your procedure.  Do not drink clear liquids within 2 hours of your scheduled arrival to the hospital.  Clear liquids include  --Water or Apple juice without pulp  --Clear carbohydrate beverage such as Gatorade  --Black Coffee or Clear Tea (No milk, no creamers, do not add anything to the coffee or tea Type 1 and type 2 diabetics should only drink water.  No gum chewing or hard candies.      __x__ 2. No Alcohol for 24 hours before or after surgery.   __x__3. No Smoking or e-cigarettes for 24 prior to surgery.  Do not use any chewable tobacco products for at least 6 hour prior to surgery   ____  4. Bring all medications with you on the day of surgery if instructed.    __x__ 5. Notify your doctor if there is any change in your medical condition     (cold, fever, infections).   __x__6. On the morning of surgery brush your teeth with toothpaste and water.  You may rinse your mouth with mouth wash if you wish.  Do not swallow any toothpaste or mouthwash.   Do not wear jewelry, make-up, hairpins, clips or nail polish.  Do not wear lotions, powders, deodorant, or perfumes.   Do not shave 48 hours prior to surgery.   Do not bring valuables to the hospital.    [facility] is not responsible for any belongings or  valuables.               Contacts, dentures or bridgework may not be worn into surgery.  Leave your suitcase in the car. After surgery it may be brought to your room.  For patients admitted to the hospital, discharge time is determined by your treatment team.  Please read over the following fact sheets that you were given:   [facility] Preparing for Surgery and or MRSA Information   _x___ Take anti-hypertensive listed below, cardiac, seizure, asthma, anti-reflux and psychiatric medicines. These include:  1. Nebivolol/Bystolic  2. Omeprazole/Prilosec  3.  4.  5.  6.  ____Fleets enema or Magnesium Citrate as directed.   _x___ Use CHG Soap or sage wipes as directed on instruction sheet   ____ Use inhalers on the day of surgery and bring to hospital day of surgery  ____ Stop Metformin and Janumet 2 days prior to surgery.    ____ Take 1/2 of usual insulin dose the night before surgery and none on the morning of surgery.   _x___ Follow recommendations from Cardiologist, Pulmonologist or PCP regarding stopping Aspirin, Coumadin, Plavix ,Eliquis, Effient, or Pradaxa, and Pletal.  _x___Stop Anti-inflammatories such as Advil, Aleve, Ibuprofen, Motrin, Naproxen, Naprosyn, Goodies powders or aspirin products. OK to take Tylenol and Celebrex.   _x___ Stop all supplements now until after surgery.  But may continue Vitamin D, Vitamin B, and multivitamin.  ____ Bring C-Pap to the hospital.
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
## 2018-06-04 ENCOUNTER — Encounter: Payer: Medicare Other | Admitting: Physician Assistant

## 2018-06-04 ENCOUNTER — Ambulatory Visit: Payer: Medicare Other | Admitting: Physician Assistant

## 2018-06-04 DIAGNOSIS — L97822 Non-pressure chronic ulcer of other part of left lower leg with fat layer exposed: Secondary | ICD-10-CM | POA: Diagnosis not present

## 2018-06-04 NOTE — Pre-Procedure Instructions (Signed)
SENT REQUEST TO HAVE CLEARANCE AND PACEMAKER FORM FAXED TO [phone]. NOTIFIED TIFFANY AT DR [facility]

## 2018-06-04 NOTE — Pre-Procedure Instructions (Signed)
Request for cardiac clearance/pacemaker device faxed to Dr. [name] Cook [facility], FYI faxed to Dr. Binnie Rail office.

## 2018-06-05 ENCOUNTER — Ambulatory Visit: Payer: Medicare Other | Admitting: Physician Assistant

## 2018-06-05 ENCOUNTER — Ambulatory Visit: Payer: Medicare Other | Admitting: Anesthesiology

## 2018-06-05 ENCOUNTER — Encounter
Admission: RE | Disposition: A | Discharge status: Home / Self Care | Payer: Self-pay | Source: Ambulatory Visit | Attending: Surgery

## 2018-06-05 ENCOUNTER — Encounter: Payer: Self-pay | Admitting: Emergency Medicine

## 2018-06-05 ENCOUNTER — Ambulatory Visit
Admission: RE | Admit: 2018-06-05 | Discharge: 2018-06-05 | Disposition: A | Discharge status: Home / Self Care | Payer: Medicare Other | Source: Ambulatory Visit | Attending: Surgery | Admitting: Surgery

## 2018-06-05 DIAGNOSIS — T8131XA Disruption of external operation (surgical) wound, not elsewhere classified, initial encounter: Secondary | ICD-10-CM | POA: Insufficient documentation

## 2018-06-05 DIAGNOSIS — Z79899 Other long term (current) drug therapy: Secondary | ICD-10-CM | POA: Insufficient documentation

## 2018-06-05 DIAGNOSIS — Y929 Unspecified place or not applicable: Secondary | ICD-10-CM | POA: Diagnosis not present

## 2018-06-05 DIAGNOSIS — Y838 Other surgical procedures as the cause of abnormal reaction of the patient, or of later complication, without mention of misadventure at the time of the procedure: Secondary | ICD-10-CM | POA: Diagnosis not present

## 2018-06-05 DIAGNOSIS — I10 Essential (primary) hypertension: Secondary | ICD-10-CM | POA: Insufficient documentation

## 2018-06-05 DIAGNOSIS — K219 Gastro-esophageal reflux disease without esophagitis: Secondary | ICD-10-CM | POA: Diagnosis not present

## 2018-06-05 HISTORY — PX: I & D EXTREMITY: SHX5045

## 2018-06-05 SURGERY — IRRIGATION AND DEBRIDEMENT EXTREMITY
Anesthesia: General | Site: Elbow | Laterality: Left | Wound class: "Clean Contaminated "

## 2018-06-05 MED ORDER — LIDOCAINE HCL (CARDIAC) PF 100 MG/5ML IV SOSY
PREFILLED_SYRINGE | INTRAVENOUS | Status: DC | PRN
  Administered 2018-06-05: 100 mg via INTRAVENOUS

## 2018-06-05 MED ORDER — TRAMADOL HCL 50 MG PO TABS
ORAL_TABLET | ORAL | Status: AC
  Filled 2018-06-05: qty 1

## 2018-06-05 MED ORDER — ONDANSETRON HCL 4 MG/2ML IJ SOLN: 4.0000 mg | Freq: Four times a day (QID) | INTRAMUSCULAR | Status: DC | PRN

## 2018-06-05 MED ORDER — LIDOCAINE HCL (PF) 2 % IJ SOLN
INTRAMUSCULAR | Status: AC
  Filled 2018-06-05: qty 10

## 2018-06-05 MED ORDER — LACTATED RINGERS IV SOLN
INTRAVENOUS | Status: DC
  Administered 2018-06-05: 10:00:00 via INTRAVENOUS

## 2018-06-05 MED ORDER — TRAMADOL HCL 50 MG PO TABS: 50.0000 mg | ORAL_TABLET | Freq: Four times a day (QID) | ORAL | 0 refills | Status: DC | PRN

## 2018-06-05 MED ORDER — BUPIVACAINE HCL (PF) 0.5 % IJ SOLN
INTRAMUSCULAR | Status: DC | PRN
  Administered 2018-06-05: 5 mL

## 2018-06-05 MED ORDER — DEXAMETHASONE SODIUM PHOSPHATE 10 MG/ML IJ SOLN
INTRAMUSCULAR | Status: DC | PRN
  Administered 2018-06-05: 6 mg via INTRAVENOUS

## 2018-06-05 MED ORDER — TRAMADOL HCL 50 MG PO TABS
50.0000 mg | ORAL_TABLET | Freq: Four times a day (QID) | ORAL | Status: DC
Start: 2018-06-05 — End: 2018-06-05
  Administered 2018-06-05: 50 mg via ORAL

## 2018-06-05 MED ORDER — ONDANSETRON HCL 4 MG PO TABS: 4.0000 mg | ORAL_TABLET | Freq: Four times a day (QID) | ORAL | Status: DC | PRN

## 2018-06-05 MED ORDER — FENTANYL CITRATE (PF) 100 MCG/2ML IJ SOLN
25.0000 ug | INTRAMUSCULAR | Status: DC | PRN
  Administered 2018-06-05: 25 ug via INTRAVENOUS

## 2018-06-05 MED ORDER — PROPOFOL 10 MG/ML IV BOLUS
INTRAVENOUS | Status: AC
  Filled 2018-06-05: qty 20

## 2018-06-05 MED ORDER — BUPIVACAINE HCL (PF) 0.5 % IJ SOLN
INTRAMUSCULAR | Status: AC
  Filled 2018-06-05: qty 30

## 2018-06-05 MED ORDER — SUGAMMADEX SODIUM 200 MG/2ML IV SOLN
INTRAVENOUS | Status: AC
  Filled 2018-06-05: qty 2

## 2018-06-05 MED ORDER — METOCLOPRAMIDE HCL 5 MG/ML IJ SOLN: 5.0000 mg | Freq: Three times a day (TID) | INTRAMUSCULAR | Status: DC | PRN

## 2018-06-05 MED ORDER — FENTANYL CITRATE (PF) 100 MCG/2ML IJ SOLN
INTRAMUSCULAR | Status: AC
  Filled 2018-06-05: qty 2

## 2018-06-05 MED ORDER — SUGAMMADEX SODIUM 200 MG/2ML IV SOLN
INTRAVENOUS | Status: DC | PRN
  Administered 2018-06-05: 100 mg via INTRAVENOUS

## 2018-06-05 MED ORDER — ONDANSETRON HCL 4 MG/2ML IJ SOLN
INTRAMUSCULAR | Status: DC | PRN
  Administered 2018-06-05: 4 mg via INTRAVENOUS

## 2018-06-05 MED ORDER — ROCURONIUM BROMIDE 100 MG/10ML IV SOLN
INTRAVENOUS | Status: DC | PRN
  Administered 2018-06-05: 15 mg via INTRAVENOUS

## 2018-06-05 MED ORDER — PROPOFOL 10 MG/ML IV BOLUS
INTRAVENOUS | Status: DC | PRN
  Administered 2018-06-05: 100 mg via INTRAVENOUS

## 2018-06-05 MED ORDER — POTASSIUM CHLORIDE IN NACL 20-0.9 MEQ/L-% IV SOLN
INTRAVENOUS | Status: DC
  Filled 2018-06-05 (×3): qty 1000

## 2018-06-05 MED ORDER — CLINDAMYCIN PHOSPHATE 900 MG/50ML IV SOLN
INTRAVENOUS | Status: AC
  Filled 2018-06-05: qty 50

## 2018-06-05 MED ORDER — FENTANYL CITRATE (PF) 100 MCG/2ML IJ SOLN
INTRAMUSCULAR | Status: DC | PRN
  Administered 2018-06-05 (×2): 50 ug via INTRAVENOUS

## 2018-06-05 MED ORDER — ONDANSETRON HCL 4 MG/2ML IJ SOLN: 4.0000 mg | Freq: Once | INTRAMUSCULAR | Status: DC | PRN

## 2018-06-05 MED ORDER — SUCCINYLCHOLINE CHLORIDE 20 MG/ML IJ SOLN
INTRAMUSCULAR | Status: DC | PRN
  Administered 2018-06-05: 10 mg via INTRAVENOUS

## 2018-06-05 MED ORDER — METOCLOPRAMIDE HCL 10 MG PO TABS: 5.0000 mg | ORAL_TABLET | Freq: Three times a day (TID) | ORAL | Status: DC | PRN

## 2018-06-05 MED ORDER — FENTANYL CITRATE (PF) 100 MCG/2ML IJ SOLN
INTRAMUSCULAR | Status: AC
  Administered 2018-06-05: 25 ug via INTRAVENOUS
  Filled 2018-06-05: qty 2

## 2018-06-05 MED ORDER — CLINDAMYCIN PHOSPHATE 900 MG/50ML IV SOLN
900.0000 mg | Freq: Once | INTRAVENOUS | Status: AC
Start: 2018-06-05 — End: 2018-06-05
  Administered 2018-06-05: 900 mg via INTRAVENOUS

## 2018-06-05 SURGICAL SUPPLY — 38 items
BLADE SURG SZ10 CARB STEEL (BLADE) ×3 IMPLANT
BNDG COHESIVE 4X5 TAN STRL (GAUZE/BANDAGES/DRESSINGS) ×3 IMPLANT
BNDG ESMARK 4X12 TAN STRL LF (GAUZE/BANDAGES/DRESSINGS) ×3 IMPLANT
CANISTER SUCT 1200ML W/VALVE (MISCELLANEOUS) ×3 IMPLANT
CHLORAPREP W/TINT 26ML (MISCELLANEOUS) ×3 IMPLANT
CUFF TOURN 18 STER (MISCELLANEOUS) ×2 IMPLANT
CUFF TOURN 24 STER (MISCELLANEOUS) IMPLANT
ELECT REM PT RETURN 9FT ADLT (ELECTROSURGICAL) ×3
ELECTRODE REM PT RTRN 9FT ADLT (ELECTROSURGICAL) ×1 IMPLANT
GLOVE BIO SURGEON STRL SZ8 (GLOVE) ×3 IMPLANT
GLOVE INDICATOR 8.0 STRL GRN (GLOVE) ×3 IMPLANT
GLOVE SURG ORTHO 8.5 STRL (GLOVE) ×3 IMPLANT
GOWN STRL REUS W/ TWL LRG LVL3 (GOWN DISPOSABLE) ×1 IMPLANT
GOWN STRL REUS W/ TWL XL LVL3 (GOWN DISPOSABLE) ×1 IMPLANT
GOWN STRL REUS W/TWL LRG LVL3 (GOWN DISPOSABLE) ×2
GOWN STRL REUS W/TWL XL LVL3 (GOWN DISPOSABLE) ×2
KIT TURNOVER KIT A (KITS) ×3 IMPLANT
LABEL OR SOLS (LABEL) ×1 IMPLANT
NDL SAFETY ECLIPSE 18X1.5 (NEEDLE) ×1 IMPLANT
NEEDLE HYPO 18GX1.5 SHARP (NEEDLE) ×2
NS IRRIG 1000ML POUR BTL (IV SOLUTION) ×3 IMPLANT
PACK EXTREMITY ARMC (MISCELLANEOUS) ×3 IMPLANT
PAD CAST CTTN 4X4 STRL (SOFTGOODS) ×1 IMPLANT
PADDING CAST COTTON 4X4 STRL (SOFTGOODS)
SPLINT CAST 1 STEP 3X12 (MISCELLANEOUS) ×1 IMPLANT
SPONGE LAP 18X18 RF (DISPOSABLE) ×3 IMPLANT
STAPLER SKIN PROX 35W (STAPLE) ×1 IMPLANT
STOCKINETTE BIAS CUT 4 980044 (GAUZE/BANDAGES/DRESSINGS) ×3 IMPLANT
STOCKINETTE IMPERVIOUS 9X36 MD (GAUZE/BANDAGES/DRESSINGS) ×3 IMPLANT
SUT ETHILON 4-0 (SUTURE)
SUT ETHILON 4-0 FS2 18XMFL BLK (SUTURE)
SUT PROLENE 0 CT 2 (SUTURE) ×2 IMPLANT
SUT VIC AB 2-0 CT1 36 (SUTURE) ×1 IMPLANT
SUT VIC AB 4-0 SH 27 (SUTURE)
SUT VIC AB 4-0 SH 27XANBCTRL (SUTURE) ×1 IMPLANT
SUT VICRYL+ 3-0 36IN CT-1 (SUTURE) ×1 IMPLANT
SUTURE ETHLN 4-0 FS2 18XMF BLK (SUTURE) ×1 IMPLANT
SYR 10ML LL (SYRINGE) ×3 IMPLANT
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
## 2018-06-05 NOTE — Anesthesia Preprocedure Evaluation (Signed)
Anesthesia Evaluation  Patient identified by MRN, date of birth, ID band Patient awake    Reviewed: Allergy & Precautions, H&P , NPO status , Patient's Chart, lab work & pertinent test results, reviewed documented beta blocker date and time   History of Anesthesia Complications (+) PONV and history of anesthetic complications  Airway Mallampati: II  TM Distance: >3 FB Neck ROM: full    Dental  (+) Dental Advidsory Given, Teeth Intact   Pulmonary neg pulmonary ROS,           Cardiovascular Exercise Tolerance: Good hypertension, (-) angina(-) CAD, (-) Past MI, (-) Cardiac Stents and (-) CABG + dysrhythmias (complete heart block) + pacemaker + Valvular Problems/Murmurs      Neuro/Psych negative neurological ROS  negative psych ROS   GI/Hepatic Neg liver ROS, GERD  ,  Endo/Other  negative endocrine ROS  Renal/GU negative Renal ROS  negative genitourinary   Musculoskeletal   Abdominal   Peds  Hematology negative hematology ROS (+)   Anesthesia Other Findings Past Medical History: No date: Anemia No date: GERD (gastroesophageal reflux disease) No date: Hypertension No date: Peripheral vascular disease (HCC)     Comment:  possible neuropathies in lower extremeties No date: Syncope   Reproductive/Obstetrics negative OB ROS                             Anesthesia Physical  Anesthesia Plan  ASA: IV  Anesthesia Plan: General   Post-op Pain Management:    Induction: Intravenous  PONV Risk Score and Plan: 3 and Ondansetron, Dexamethasone and Treatment may vary due to age or medical condition  Airway Management Planned: LMA  Additional Equipment:   Intra-op Plan:   Post-operative Plan: Extubation in OR  Informed Consent: I have reviewed the patients History and Physical, chart, labs and discussed the procedure including the risks, benefits and alternatives for the proposed anesthesia  with the patient or authorized representative who has indicated his/her understanding and acceptance.   Dental Advisory Given  Plan Discussed with: Anesthesiologist, CRNA and Surgeon  Anesthesia Plan Comments:         Anesthesia Quick Evaluation
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
## 2018-06-05 NOTE — Discharge Instructions (Addendum)
Orthopedic discharge instructions: Keep dressing dry and intact. Keep hand elevated above heart level. May shower after dressing removed on postop day 4 (Saturday). Cover sutures with special padded adhesive dressing after drying off. Apply ice to affected area frequently. Take ES Tylenol or pain medication as prescribed when needed.  Return for follow-up in 10-14 days or as scheduled.  AMBULATORY SURGERY  DISCHARGE INSTRUCTIONS   1) The drugs that you were given will stay in your system until tomorrow so for the next 24 hours you should not:  A) Drive an automobile B) Make any legal decisions C) Drink any alcoholic beverage   2) You may resume regular meals tomorrow.  Today it is better to start with liquids and gradually work up to solid foods.  You may eat anything you prefer, but it is better to start with liquids, then soup and crackers, and gradually work up to solid foods.   3) Please notify your doctor immediately if you have any unusual bleeding, trouble breathing, redness and pain at the surgery site, drainage, fever, or pain not relieved by medication.    4) Additional Instructions:        Please contact your physician with any problems or Same Day Surgery at [phone], Monday through Friday 6 am to 4 pm, or [place] at [facility] number at [phone].

## 2018-06-05 NOTE — Op Note (Signed)
06/05/2018  11:26 AM  Patient:   [name]  Pre-Op Diagnosis:   Superficial wound dehiscence status post I&D of septic olecranon bursa, left elbow.  Post-Op Diagnosis:   Same.  Procedure:   Irrigation and debridement with delayed primary closure, left posterior elbow wound.  Surgeon:   Maryagnes Amos, MD  Assistant:   None  Anesthesia:   General LMA  Findings:   As above.  Complications:   None  Fluids:   600 cc crystalloid  EBL:   2 cc  UOP:   None  TT:   17 minutes at 250 mmHg  Drains:   None  Closure:   #0 Prolene interrupted sutures  Brief Clinical Note:   The patient is an 84 year old female who underwent a formal irrigation and debridement of a septic olecranon bursa 2 months ago.  A small portion of the wound directly over the posterior elbow opened up.  It has failed to heal despite dressing changes and assistance from the wound care clinic.  The patient presents at this time for irrigation and debridement with delayed primary closure of the posterior left elbow wound.  Procedure:   The patient was brought into the operating room and lain in the supine position.  After adequate general laryngeal mask anesthesia was obtained, the patient's left upper extremity was prepped with ChloraPrep solution before being draped sterilely.  Preoperative antibiotics were administered.  The wound was carefully ellipsed out sharply with a #15 blade before the deeper tissues were debrided using a small curette and rongeurs to remove the fibrinous rind overlying the triceps tendon.  The wound was then irrigated thoroughly with sterile saline solution before it was closed using several #0 Prolene interrupted sutures placed in a near-far-far-near fashion.  A total of 5 cc of 0.5% plain Sensorcaine was injected in around the incision to help with postoperative analgesia before a sterile bulky dressing was applied to the wound.  The patient was then awakened, extubated, and returned to the  recovery room in satisfactory condition after tolerating the procedure well.

## 2018-06-05 NOTE — Anesthesia Procedure Notes (Signed)
Procedure Name: LMA Insertion Date/Time: 06/05/2018 10:21 AM Performed by: Darrol Jump, CRNA Pre-anesthesia Checklist: Patient identified, Emergency Drugs available, Suction available and Patient being monitored Patient Re-evaluated:Patient Re-evaluated prior to induction Oxygen Delivery Method: Circle system utilized Preoxygenation: Pre-oxygenation with 100% oxygen Induction Type: IV induction LMA: LMA inserted LMA Size: 4.0 Number of attempts: 1 Placement Confirmation: breath sounds checked- equal and bilateral and positive ETCO2 Tube secured with: Tape Dental Injury: Teeth and Oropharynx as per pre-operative assessment

## 2018-06-05 NOTE — H&P (Signed)
Paper H&P to be scanned into permanent record. H&P reviewed and patient re-examined. No changes. 

## 2018-06-05 NOTE — Anesthesia Post-op Follow-up Note (Signed)
Anesthesia QCDR form completed.        

## 2018-06-05 NOTE — OR Nursing (Signed)
Discharge instructions discussed with pt and son. Both voice understanding. Pt has dressing for left elbow wound in pt belonging bag to go home with pt.

## 2018-06-05 NOTE — Transfer of Care (Signed)
Immediate Anesthesia Transfer of Care Note  Patient: [name]  Procedure(s) Performed: IRRIGATION AND DEBRIDEMENT EXTREMITY (Left Elbow)  Patient Location: PACU  Anesthesia Type:General  Level of Consciousness: awake, alert , oriented and patient cooperative  Airway & Oxygen Therapy: Patient Spontanous Breathing and Patient connected to nasal cannula oxygen  Post-op Assessment: Report given to RN and Post -op Vital signs reviewed and stable  Post vital signs: Reviewed and stable  Last Vitals:  Vitals Value Taken Time  BP    Temp    Pulse 67 06/05/2018 11:02 AM  Resp 9 06/05/2018 11:02 AM  SpO2 100 % 06/05/2018 11:02 AM  Vitals shown include unvalidated device data.  Last Pain:  Vitals:   06/05/18 0920  TempSrc: Temporal  PainSc: 0-No pain         Complications: No apparent anesthesia complications

## 2018-06-06 ENCOUNTER — Encounter: Payer: Self-pay | Admitting: Surgery

## 2018-06-06 NOTE — Anesthesia Postprocedure Evaluation (Signed)
Anesthesia Post Note  Patient: [name]  Procedure(s) Performed: IRRIGATION AND DEBRIDEMENT EXTREMITY (Left Elbow)  Patient location during evaluation: PACU Anesthesia Type: General Level of consciousness: awake and alert Pain management: pain level controlled Vital Signs Assessment: post-procedure vital signs reviewed and stable Respiratory status: spontaneous breathing, nonlabored ventilation, respiratory function stable and patient connected to nasal cannula oxygen Cardiovascular status: blood pressure returned to baseline and stable Postop Assessment: no apparent nausea or vomiting Anesthetic complications: no     Last Vitals:  Vitals:   06/05/18 1153 06/05/18 1211  BP: (!) 175/77 (!) 162/56  Pulse: 70 65  Resp: 14   Temp:    SpO2: 97% 98%    Last Pain:  Vitals:   06/06/18 0750  TempSrc:   PainSc: 7                  Lenard Simmer

## 2018-06-11 ENCOUNTER — Ambulatory Visit: Payer: Medicare Other | Admitting: Physician Assistant

## 2018-06-12 ENCOUNTER — Ambulatory Visit: Payer: Medicare Other | Admitting: Physician Assistant

## 2018-06-29 ENCOUNTER — Encounter: Payer: Medicare Other | Attending: Physician Assistant | Admitting: Physician Assistant

## 2018-06-29 DIAGNOSIS — G8929 Other chronic pain: Secondary | ICD-10-CM | POA: Insufficient documentation

## 2018-06-30 NOTE — Progress Notes (Addendum)
REISE, HIETALA ([id number]) Visit Report for 06/29/2018 Arrival Information Details Patient Name: [name], [name] Date of Service: 06/29/2018 2:45 PM Medical Record Number: [id number] Patient Account Number: [id number] Date of Birth/Sex: 07-15-1930 (84 y.o. F) Treating RN: Rema [name] Primary Care Aleiyah Halpin: Aram Beecham Other Clinician: Referring Torien Ramroop: Aram Beecham Treating Nehal Shives/Extender: Linwood Dibbles, HOYT Weeks in Treatment: 32 Visit Information History Since Last Visit Added or deleted any medications: No Patient Arrived: Walker Any new allergies or adverse reactions: No Arrival Time: 14:53 Had a fall or experienced change in No Accompanied By: self activities of daily living that may affect Transfer Assistance: None risk of falls: Patient Identification Verified: Yes Signs or symptoms of abuse/neglect since last visito No Secondary Verification Process Yes Hospitalized since last visit: No Completed: Implantable device outside of the clinic excluding No Patient Requires Transmission-Based No cellular tissue based products placed in the center Precautions: since last visit: Patient Has Alerts: Yes Has Dressing in Place as Prescribed: Yes Patient Alerts: non compressible left Pain Present Now: No leg Electronic Signature(s) Signed: 06/29/2018 3:03:32 PM By: Rema [name] Entered By: Rema [name] on 06/29/2018 14:54:12 [name] ([id number]) -------------------------------------------------------------------------------- Encounter Discharge Information Details Patient Name: [name] Date of Service: 06/29/2018 2:45 PM Medical Record Number: [id number] Patient Account Number: [id number] Date of Birth/Sex: [birth year] (84 y.o. F) Treating RN: Curtis Sites Primary Care Starlynn Klinkner: Aram Beecham Other Clinician: Referring Concetta Guion: Aram Beecham Treating Lagina Reader/Extender: Linwood Dibbles, HOYT Weeks in Treatment: 82 Encounter Discharge Information  Items Discharge Condition: Stable Ambulatory Status: Walker Discharge Destination: Home Transportation: Private Auto Accompanied By: self Schedule Follow-up Appointment: No Clinical Summary of Care: Electronic Signature(s) Signed: 07/02/2018 2:22:32 PM By: Curtis Sites Entered By: Curtis Sites on 06/29/2018 15:15:08 [name] ([id number]) -------------------------------------------------------------------------------- General Visit Notes Details Patient Name: [name] Date of Service: 06/29/2018 2:45 PM Medical Record Number: [id number] Patient Account Number: [id number] Date of Birth/Sex: 1930/02/21 (84 y.o. F) Treating RN: Curtis Sites Primary Care Marcheta Horsey: Aram Beecham Other Clinician: Referring Zena Vitelli: Aram Beecham Treating Billey Wojciak/Extender: Linwood Dibbles, HOYT Weeks in Treatment: 32 Notes Patient's elbow wound is currently under the treatment of Dr Joice Lofts r/t this being a new surgical wound Electronic Signature(s) Signed: 07/02/2018 2:22:32 PM By: Curtis Sites Entered By: Curtis Sites on 06/29/2018 15:11:49 [name] ([id number]) -------------------------------------------------------------------------------- Lower Extremity Assessment Details Patient Name: [name] Date of Service: 06/29/2018 2:45 PM Medical Record Number: [id number] Patient Account Number: [id number] Date of Birth/Sex: [birth year] (84 y.o. F) Treating RN: Curtis Sites Primary Care Clora Ohmer: Aram Beecham Other Clinician: Referring Sallie Maker: Aram Beecham Treating Kaiulani Sitton/Extender: Linwood Dibbles, HOYT Weeks in Treatment: 32 Electronic Signature(s) Signed: 07/02/2018 2:22:32 PM By: Curtis Sites Entered By: Curtis Sites on 06/29/2018 15:11:58 [name] ([id number]) -------------------------------------------------------------------------------- Pain Assessment Details Patient Name: [name] Date of Service: 06/29/2018 2:45 PM Medical Record  Number: [id number] Patient Account Number: [id number] Date of Birth/Sex: 1930/08/29 (83 y.o. F) Treating RN: Rema [name] Primary Care Darvell Monteforte: Aram Beecham Other Clinician: Referring Abiel Antrim: Aram Beecham Treating Trinka Keshishyan/Extender: Linwood Dibbles, HOYT Weeks in Treatment: 32 Active Problems Location of Pain Severity and Description of Pain Patient Has Paino No Site Locations Pain Management and Medication Current Pain Management: Goals for Pain Management Topical or injectable lidocaine is offered to patient for acute pain when surgical debridement is performed. If needed, Patient is instructed to use over the counter pain medication for the following 24-48 hours after debridement. Wound care MDs do not prescribed pain medications. Patient has chronic pain or  uncontrolled pain. Patient has been instructed to make an appointment with their Primary Care Physician for pain management. Electronic Signature(s) Signed: 06/29/2018 3:03:32 PM By: Rema [name] Entered By: Rema [name] on 06/29/2018 14:57:14 [name] ([id number]) -------------------------------------------------------------------------------- Patient/Caregiver Education Details Patient Name: [name] Date of Service: 06/29/2018 2:45 PM Medical Record Number: [id number] Patient Account Number: [id number] Date of Birth/Gender: Jun 08, 1930 (84 y.o. F) Treating RN: Curtis Sites Primary Care Physician: Aram Beecham Other Clinician: Referring Physician: Aram Beecham Treating Physician/Extender: Skeet Simmer in Treatment: 90 Education Assessment Education Provided To: Patient Education Topics Provided Wound/Skin Impairment: Handouts: Other: go to Dr Binnie Rail office Methods: Explain/Verbal Responses: State content correctly Electronic Signature(s) Signed: 07/02/2018 2:22:32 PM By: Curtis Sites Entered By: Curtis Sites on 06/29/2018 15:15:40 [name]  ([id number]) -------------------------------------------------------------------------------- Vitals Details Patient Name: [name] Date of Service: 06/29/2018 2:45 PM Medical Record Number: [id number] Patient Account Number: [id number] Date of Birth/Sex: 01-09-1930 (84 y.o. F) Treating RN: Rema [name] Primary Care Ednamae Schiano: Aram Beecham Other Clinician: Referring Edithe Dobbin: Aram Beecham Treating Ramces Shomaker/Extender: Linwood Dibbles, HOYT Weeks in Treatment: 32 Vital Signs Time Taken: 14:57 Temperature (F): 98.4 Height (in): 60 Pulse (bpm): 68 Weight (lbs): 123 Respiratory Rate (breaths/min): 16 Body Mass Index (BMI): 24 Blood Pressure (mmHg): 157/76 Reference Range: 80 - 120 mg / dl Electronic Signature(s) Signed: 06/29/2018 3:03:32 PM By: Rema [name] Entered By: Rema [name] on 06/29/2018 14:57:49

## 2018-07-02 NOTE — Progress Notes (Signed)
RANDALYN, AHMED ([id number]) Visit Report for 06/29/2018 Physician Orders Details Patient Name: [name], [name]. Date of Service: 06/29/2018 2:45 PM Medical Record Number: [id number] Patient Account Number: [id number] Date of Birth/Sex: February 15, 1930 (84 y.o. F) Treating RN: Curtis Sites Primary Care Provider: Aram Beecham Other Clinician: Referring Provider: Aram Beecham Treating Provider/Extender: Linwood Dibbles, Johnie Stadel Weeks in Treatment: 75 Verbal / Phone Orders: No Diagnosis Coding Discharge From [facility] Wound #9 Left Elbow o Discharge from [facility] - [name] is advised to go straight to Dr Poggi's office related to concerns about new surgical wound on her elbow Electronic Signature(s) Signed: 06/30/2018 1:32:06 AM By: Lenda Kelp PA-C Signed: 07/02/2018 2:22:32 PM By: Curtis Sites Entered By: Curtis Sites on 06/29/2018 15:14:49

## 2018-07-04 ENCOUNTER — Encounter: Payer: Medicare Other | Admitting: Internal Medicine

## 2018-07-04 ENCOUNTER — Ambulatory Visit
Admission: RE | Admit: 2018-07-04 | Discharge: 2018-07-04 | Disposition: A | Discharge status: Home / Self Care | Payer: Medicare Other | Source: Ambulatory Visit | Attending: Internal Medicine | Admitting: Internal Medicine

## 2018-07-04 ENCOUNTER — Other Ambulatory Visit (HOSPITAL_BASED_OUTPATIENT_CLINIC_OR_DEPARTMENT_OTHER): Payer: Self-pay | Admitting: Internal Medicine

## 2018-07-04 DIAGNOSIS — S41109A Unspecified open wound of unspecified upper arm, initial encounter: Secondary | ICD-10-CM

## 2018-07-04 DIAGNOSIS — X58XXXA Exposure to other specified factors, initial encounter: Secondary | ICD-10-CM | POA: Insufficient documentation

## 2018-07-04 DIAGNOSIS — G8929 Other chronic pain: Secondary | ICD-10-CM | POA: Diagnosis present

## 2018-07-05 ENCOUNTER — Other Ambulatory Visit
Admission: RE | Admit: 2018-07-05 | Discharge: 2018-07-05 | Disposition: A | Discharge status: Home / Self Care | Payer: Medicare Other | Source: Ambulatory Visit | Attending: Internal Medicine | Admitting: Internal Medicine

## 2018-07-05 DIAGNOSIS — B9689 Other specified bacterial agents as the cause of diseases classified elsewhere: Secondary | ICD-10-CM | POA: Insufficient documentation

## 2018-07-05 DIAGNOSIS — L089 Local infection of the skin and subcutaneous tissue, unspecified: Secondary | ICD-10-CM | POA: Insufficient documentation

## 2018-07-07 LAB — AEROBIC CULTURE W GRAM STAIN (SUPERFICIAL SPECIMEN): Culture: NORMAL

## 2018-07-07 LAB — AEROBIC CULTURE  (SUPERFICIAL SPECIMEN)

## 2018-07-09 ENCOUNTER — Ambulatory Visit: Payer: Medicare Other | Admitting: Physician Assistant

## 2018-07-11 ENCOUNTER — Encounter: Payer: Medicare Other | Admitting: Internal Medicine

## 2018-07-11 DIAGNOSIS — G8929 Other chronic pain: Secondary | ICD-10-CM | POA: Diagnosis not present

## 2018-07-18 ENCOUNTER — Encounter: Payer: Medicare Other | Attending: Nurse Practitioner | Admitting: Nurse Practitioner

## 2018-07-18 DIAGNOSIS — L97812 Non-pressure chronic ulcer of other part of right lower leg with fat layer exposed: Secondary | ICD-10-CM | POA: Insufficient documentation

## 2018-07-18 DIAGNOSIS — M351 Other overlap syndromes: Secondary | ICD-10-CM | POA: Diagnosis not present

## 2018-07-18 DIAGNOSIS — M069 Rheumatoid arthritis, unspecified: Secondary | ICD-10-CM | POA: Insufficient documentation

## 2018-07-18 DIAGNOSIS — M419 Scoliosis, unspecified: Secondary | ICD-10-CM | POA: Diagnosis not present

## 2018-07-18 DIAGNOSIS — Z950 Presence of cardiac pacemaker: Secondary | ICD-10-CM | POA: Insufficient documentation

## 2018-07-18 DIAGNOSIS — L98495 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: Secondary | ICD-10-CM | POA: Insufficient documentation

## 2018-07-23 NOTE — Progress Notes (Signed)
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 complication. She did see Dr. Darleen Crocker earlier today and he states that he's willing to give this three weeks to see if she's making any progress with wound care. However he states that she's really not then he will need to go back in and perform further surgery. Obviously she is trying to avoid surgery if at all possible although I'm not sure if this is going to be possible or least not that quickly. 05/11/18 on evaluation today patient appears to be doing very well in regard to her left lower extremity ulcers. Unfortunately in regard to her elbow this is very slow coming about as far as any improvement is concerned. I do feel like there may be a little bit more granulation noted in the base of the wound but nothing too significant unfortunately. I still can probe bone in the proximal portion of the wound which obviously explain to the patient is not good. She will be having a follow-up with her orthopedic surgeon in the next couple of weeks. In the meantime we are trying to do as much as we can to try to show signs of improvement in healing to avoid the need for any additional and further surgery.  Nonetheless I explained to the patient yet again today I'm not sure if that is going to be feasible or not obviously it's more risk for her to continue to have an open wound with bone exposure then to the back in for additional surgery even though I know she doesn't want to go that route. 05/15/18 on evaluation today patient presents for follow-up concerning her ongoing lower extremity ulcers on the left as well as the left elbow ulcer. She has at this point in time been tolerating the dressing changes without complication. Her left lower extremity ulcer appears to be doing very well. In regard to the left elbow ulcer she actually does seem to have additional granulation today which is good news. I am definitely seeing signs of improvement  although obviously this is somewhat slow improvement. Nonetheless I'm hopeful we will be able to avoid her having to have any further surgery but again that would definitely be a conversation between herself as well as her surgeon once he sees her for reevaluation. Otherwise she does want to see about having a three order compression stockings for her today 05/21/18 on evaluation today patient appears to be doing well in regard to her left lower surety ulcer. This is almost completely healed and seems to be progressing very nicely. With that being said her left elbow is another story. I'm not really convinced in the past three weeks we've seen a significant improvement in this wound. With that being said if this is something that there is no surgical option for him we have to continue to work on this from the standpoint of conservative management with wound care she may make improvement given time. Nonetheless it appears that her surgeon is somewhat concerned about the possibility of infection and really is leaning towards additional surgery to try and help close this wound. Nonetheless the patient is still unsure of exactly what to do. 05/29/18 on evaluation today  patient appears to be doing well in regard to her left lower extremity ulcer. She's been tolerating the dressing changes without complication which is good news. With that being said she's been having issues specifically with her elbow she did see her surgeon Dr. Joice Lofts and he is recommending a repeat surgery to the left elbow in order to correct the issue. The patient is still somewhat unsure of this but feels like this may be better than trying to take time to let this heal over a longer period of time through normal wound care measures. Again I explained that I agree this may be a faster way to go if her surgeon feels that this is indeed a good direction to take. Obviously only he can make the judgment on whether or not the surgery would likely be successful. 06/04/18 on evaluation today patient actually presents for follow-up concerning her left elbow and left lower from the ulcer she seems to be doing very well at this point in time. She has been tolerating the dressing changes without complication. With that being said her elbow is not significantly better she actually is scheduled for surgery tomorrow. 07/04/18; the patient had an area on her left leg that is remaining closed. The open area she has now is a postsurgical wound on the left elbow. I think we have clearance from the surgeon to see this now. [address] [place] VMarland Kitchen ([id number]) 07/11/18; we're currently dealing with a surgical wound on the left olecranon process. The patient complains of a lot of pain and drainage. When I saw her last week we did an x-ray that showed soft tissue wound and probable elbow joint effusion but no erosion to suggest osteomyelitis. The culture I did of this was somewhat surprisingly negative. She has a small open wound with not a viable surface there is considerable undermining relative to the wound size. She is on methotrexate for rheumatoid arthritis/overlap syndrome also plaquenil. We've been  using silver collagen Objective Constitutional Sitting or standing Blood Pressure is within target range for patient.. Pulse regular and within target range for patient.Marland Kitchen Respirations regular, non-labored and within target range.. Temperature is normal and within the target range for the patient.Marland Kitchen appears in no distress. Vitals Time Taken: 3:39 PM, Height: 60 in, Weight: 123 lbs, BMI: 24, Temperature: 98.3 F, Pulse:  70 bpm, Respiratory Rate: 18 breaths/min, Blood Pressure: 120/50 mmHg. Respiratory Respiratory effort is easy and symmetric bilaterally. Rate is normal at rest and on room air.Marland Kitchen Lymphatic none palpable in the epitrochlear area. Musculoskeletal there is no obvious effusion in the left elbow. General Notes: wound exam; nonviable wound bed. Using #3 curet I removed adherent debris from the wound surface. Hemostasis with direct pressure. She has very significant tenderness and some erythema around the wound there is no crepitus. If this is not cellulitis and the reason for the inflammation is unclear. Integumentary (Hair, Skin) significant chronic venous insufficiency in the lower extremities but no open wound here.. Wound #9 status is Open. Original cause of wound was Trauma. The wound is located on the Left Elbow. The wound measures 0.8cm length x 0.4cm width x 0.2cm depth; 0.251cm^2 area and 0.05cm^3 volume. There is bone and Fat Layer (Subcutaneous Tissue) Exposed exposed. There is no tunneling noted, however, there is undermining starting at 12:00 and ending at 12:00 with a maximum distance of 1.2cm. There is a large amount of purulent drainage noted. The wound margin is distinct with the outline attached to the wound base. There is small (1-33%) pink granulation within the wound bed. There is a large (67-100%) amount of necrotic tissue within the wound bed including Adherent Slough. The periwound skin appearance exhibited: Callus, Scarring. The periwound skin appearance did  not exhibit: Crepitus, Excoriation, Induration, Rash, Dry/Scaly, Maceration, Atrophie Blanche, Cyanosis, Ecchymosis, Hemosiderin Staining, Mottled, Pallor, Rubor, Erythema. Periwound temperature was noted as No Abnormality. The periwound has tenderness on palpation. 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 There was a Excisional Skin/Subcutaneous Tissue Debridement with a total area of 0.32 sq cm performed by Maxwell Caul, MD. With the following instrument(s): Curette to remove Non-Viable tissue/material. Material removed includes Subcutaneous Tissue and Slough and after achieving pain control using Other (lidocaine 4%). No specimens were taken. A time out was conducted at 16:05, prior to the start of the procedure. A Minimum amount of bleeding was controlled with Pressure. The procedure was tolerated well. Patient s Level of Consciousness post procedure was recorded as Awake and Alert. Post Debridement Measurements: 0.8cm length x 0.4cm width x 0.2cm depth; 0.05cm^3 volume. Character of Wound/Ulcer Post Debridement requires further debridement. Post procedure Diagnosis Wound #9: Same as Pre-Procedure Plan Wound Cleansing: Wound #9 Left Elbow: Clean wound with Normal Saline. Anesthetic (add to Medication List): Wound #9 Left Elbow: Topical Lidocaine 4% cream applied to wound bed prior to debridement (In Clinic Only). Primary Wound Dressing: Wound #9 Left Elbow: Silver Collagen Secondary Dressing: Wound #9 Left  Elbow: Boardered Foam Dressing Dressing Change Frequency: Wound #9 Left Elbow: Change Dressing Monday, Wednesday, Friday Follow-up Appointments: Wound #9 Left Elbow: Return Appointment in 1 week. Medications-please add to medication list.: Wound #9 Left Elbow: P.O. Antibiotics The following medication(s) was prescribed: azithromycin oral 500 mg tablet 1 tablet oral daily for 7 days for wound infection starting 07/11/2018 [name] ([id number]) #1 very difficult situation #2 I'm going to give her empiric antibiotics however she is allergic to penicillin, doxycycline and ciprofloxacin. Also trimethoprim  and sulfamethoxazole is contraindicated with methotrexate. This left knee with the option of using a macrolide so I gave her azithromycin. These have some methicillin sensitive staph aureus coverage. Clindamycin might of been an option. I'd like to give her this for 7 days #3 if we can reduce the inflammation and drainage in this wound then I think she is going to need a skin substitute perhaps graphix PL orOasis to see if we can stimulate granulation and get some tissue adherence with the base of the wound. #4 her culture and x-ray that I did last week were negative. But she is still having significant drainage from this Electronic Signature(s) Signed: 07/11/2018 6:17:43 PM By: Baltazar Najjar MD Entered By: Baltazar Najjar on 07/11/2018 17:45:56 [name] ([id number]) -------------------------------------------------------------------------------- SuperBill Details Patient Name: [name] Date of Service: 07/11/2018 Medical Record Number: [id number] Patient Account Number: [id number] Date of Birth/Sex: [birth year] (84 y.o. 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 Facility Procedures CPT4: Description Modifier Quantity Code [id number] 11042 - DEB SUBQ TISSUE 20 SQ CM/< 1 ICD-10 Diagnosis Description T81.31XA Disruption of external operation (surgical) wound, not elsewhere classified, initial encounter Physician Procedures CPT4: Description Modifier Quantity Code [id number] 11042 - WC PHYS SUBQ TISS 20 SQ CM 1 ICD-10 Diagnosis Description T81.31XA Disruption of external operation (surgical) wound, not elsewhere classified, initial encounter Electronic Signature(s) Signed: 07/11/2018 6:17:43 PM By: Baltazar Najjar MD Entered By: Baltazar Najjar on 07/11/2018 17:46:35

## 2018-07-25 ENCOUNTER — Other Ambulatory Visit
Admission: RE | Admit: 2018-07-25 | Discharge: 2018-07-25 | Disposition: A | Discharge status: Home / Self Care | Payer: Medicare Other | Source: Ambulatory Visit | Attending: Nurse Practitioner | Admitting: Nurse Practitioner

## 2018-07-25 ENCOUNTER — Encounter: Payer: Medicare Other | Admitting: Nurse Practitioner

## 2018-07-25 DIAGNOSIS — L98495 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: Secondary | ICD-10-CM | POA: Diagnosis not present

## 2018-07-25 DIAGNOSIS — L089 Local infection of the skin and subcutaneous tissue, unspecified: Secondary | ICD-10-CM | POA: Insufficient documentation

## 2018-07-25 NOTE — Progress Notes (Signed)
AQUARIUS, TREMPER ([id number]) Visit Report for 07/11/2018 Arrival Information Details Patient Name: [name], [name]. Date of Service: 07/11/2018 3:15 PM Medical Record Number: [id number] Patient Account Number: [id number] Date of Birth/Sex: 1930-02-08 (84 y.o. F) Treating RN: Phillis Haggis Primary Care Laquentin Loudermilk: Aram Beecham Other Clinician: Referring Kahmari Koller: Aram Beecham Treating Athena Baltz/Extender: Altamese [place] in Treatment: 73 Visit Information History Since Last Visit All ordered tests and consults were completed: No Patient Arrived: Dan Humphreys Added or deleted any medications: No Arrival Time: 15:36 Any new allergies or adverse reactions: No Accompanied By: self Had a fall or experienced change in No Transfer Assistance: EasyPivot Patient activities of daily living that may affect Lift risk of falls: Patient Identification Verified: Yes Signs or symptoms of abuse/neglect since last visito No Secondary Verification Process Yes Hospitalized since last visit: No Completed: Implantable device outside of the clinic excluding No Patient Requires Transmission-Based No cellular tissue based products placed in the center Precautions: since last visit: Patient Has Alerts: Yes Has Dressing in Place as Prescribed: Yes Patient Alerts: non compressible left Pain Present Now: Yes leg Electronic Signature(s) Signed: 07/16/2018 4:59:33 PM By: Alejandro Mulling Entered By: Alejandro Mulling on 07/11/2018 15:38:31 [name] ([id number]) -------------------------------------------------------------------------------- Encounter Discharge Information Details Patient Name: [name] Date of Service: 07/11/2018 3:15 PM Medical Record Number: [id number] Patient Account Number: [id number] Date of Birth/Sex: [birth year] (84 y.o. F) Treating RN: Curtis Sites Primary Care Andrianna Manalang: Aram Beecham Other Clinician: Referring Shabrea Weldin: Aram Beecham Treating  Emmely Bittinger/Extender: Altamese [place] in Treatment: 67 Encounter Discharge Information Items Discharge Condition: Stable Ambulatory Status: Walker Discharge Destination: Home Transportation: Private Auto Accompanied By: self Schedule Follow-up Appointment: Yes Clinical Summary of Care: Electronic Signature(s) Signed: 07/11/2018 5:07:03 PM By: Curtis Sites Entered By: Curtis Sites on 07/11/2018 17:07:03 [name] ([id number]) -------------------------------------------------------------------------------- Lower Extremity Assessment Details Patient Name: [name] Date of Service: 07/11/2018 3:15 PM Medical Record Number: [id number] Patient Account Number: [id number] Date of Birth/Sex: 06-Nov-1930 (84 y.o. F) Treating RN: Phillis Haggis Primary Care Madlyn Crosby: Aram Beecham Other Clinician: Referring Naomie Crow: Aram Beecham Treating Quincey Nored/Extender: Altamese [place] in Treatment: 34 Electronic Signature(s) Signed: 07/16/2018 4:59:33 PM By: Alejandro Mulling Entered By: Alejandro Mulling on 07/11/2018 15:47:07 [name] ([id number]) -------------------------------------------------------------------------------- Multi Wound Chart Details Patient Name: [name] Date of Service: 07/11/2018 3:15 PM Medical Record Number: [id number] Patient Account Number: [id number] Date of Birth/Sex: 1930-04-04 (84 y.o. F) Treating RN: Huel Coventry Primary Care Emmabelle Fear: Aram Beecham Other Clinician: Referring Valyncia Wiens: Aram Beecham Treating Donyetta Ogletree/Extender: Altamese [place] in Treatment: 34 Vital Signs Height(in): 60 Pulse(bpm): 70 Weight(lbs): 123 Blood Pressure(mmHg): 120/50 Body Mass Index(BMI): 24 Temperature(F): 98.3 Respiratory Rate 18 (breaths/min): Photos: [9:No Photos] [N/A:N/A] Wound Location: [9:Left Elbow] [N/A:N/A] Wounding Event: [9:Trauma] [N/A:N/A] Primary Etiology: [9:Trauma, Other] [N/A:N/A] Comorbid History:  [9:Hypertension, Gout, Osteoarthritis, Neuropathy] [N/A:N/A] Date Acquired: [9:01/26/2018] [N/A:N/A] Weeks of Treatment: [9:10] [N/A:N/A] Wound Status: [9:Open] [N/A:N/A] Measurements L x W x D [9:0.8x0.4x0.2] [N/A:N/A] (cm) Area (cm) : [9:0.251] [N/A:N/A] Volume (cm) : [9:0.05] [N/A:N/A] % Reduction in Area: [9:-253.50%] [N/A:N/A] % Reduction in Volume: [9:-257.10%] [N/A:N/A] Starting Position 1 [9:12] (o'clock): Ending Position 1 [9:12] (o'clock): Maximum Distance 1 (cm): [9:1.2] Undermining: [9:Yes] [N/A:N/A] Classification: [9:Full Thickness With Exposed Support Structures] [N/A:N/A] Exudate Amount: [9:Large] [N/A:N/A] Exudate Type: [9:Purulent] [N/A:N/A] Exudate Color: [9:yellow, brown, green] [N/A:N/A] Wound Margin: [9:Distinct, outline attached] [N/A:N/A] Granulation Amount: [9:Small (1-33%)] [N/A:N/A] Granulation Quality: [9:Pink] [N/A:N/A] Necrotic Amount: [9:Large (67-100%)] [N/A:N/A] Exposed Structures: [9:Fat Layer (Subcutaneous Tissue) Exposed: Yes  Bone: Yes Fascia: No Tendon: No Muscle: No Joint: No] [N/A:N/A] Epithelialization: None N/A N/A Debridement: Debridement - Excisional N/A N/A Pre-procedure 16:05 N/A N/A Verification/Time Out Taken: Pain Control: Other N/A N/A Tissue Debrided: Subcutaneous, Slough N/A N/A Level: Skin/Subcutaneous Tissue N/A N/A Debridement Area (sq cm): 0.32 N/A N/A Instrument: Curette N/A N/A Bleeding: Minimum N/A N/A Hemostasis Achieved: Pressure N/A N/A Debridement Treatment Procedure was tolerated well N/A N/A Response: Post Debridement 0.8x0.4x0.2 N/A N/A Measurements L x W x D (cm) Post Debridement Volume: 0.05 N/A N/A (cm) Periwound Skin Texture: Callus: Yes N/A N/A Scarring: Yes Excoriation: No Induration: No Crepitus: No Rash: No Periwound Skin Moisture: Maceration: No N/A N/A Dry/Scaly: No Periwound Skin Color: Atrophie Blanche: No N/A N/A Cyanosis: No Ecchymosis: No Erythema: No Hemosiderin Staining:  No Mottled: No Pallor: No Rubor: No Temperature: No Abnormality N/A N/A Tenderness on Palpation: Yes N/A N/A Wound Preparation: Ulcer Cleansing: N/A N/A Rinsed/Irrigated with Saline Topical Anesthetic Applied: Other: lidocaine 4% Procedures Performed: Debridement N/A N/A Treatment Notes Wound #9 (Left Elbow) 1. Cleansed with: Clean wound with Normal Saline 3. Peri-wound Care: Skin Prep 4. Dressing Applied: Prisma Ag 5. Secondary Dressing Applied Bordered Foam Dressing Dry Gauze [name], [name] ([id number]) Electronic Signature(s) Signed: 07/11/2018 6:17:43 PM By: Baltazar Najjar MD Entered By: Baltazar Najjar on 07/11/2018 17:38:51 [name] ([id number]) -------------------------------------------------------------------------------- Multi-Disciplinary Care Plan Details Patient Name: [name], [name] Date of Service: 07/11/2018 3:15 PM Medical Record Number: [id number] Patient Account Number: [id number] Date of Birth/Sex: 1930/10/04 (84 y.o. F) Treating RN: Huel Coventry Primary Care Citlali Gautney: Aram Beecham Other Clinician: Referring Mixtli Reno: Aram Beecham Treating Janeliz Prestwood/Extender: Altamese [place] in Treatment: 71 Active Inactive ` Wound/Skin Impairment Nursing Diagnoses: Impaired tissue integrity Knowledge deficit related to ulceration/compromised skin integrity Goals: Patient/caregiver will verbalize understanding of skin care regimen Date Initiated: 11/14/2017 Target Resolution Date: 11/28/2017 Goal Status: Active Ulcer/skin breakdown will have a volume reduction of 30% by week 4 Date Initiated: 11/14/2017 Target Resolution Date: 11/28/2017 Goal Status: Active Interventions: Assess patient/caregiver ability to obtain necessary supplies Assess patient/caregiver ability to perform ulcer/skin care regimen upon admission and as needed Assess ulceration(s) every visit Treatment Activities: Skin care regimen initiated :  11/14/2017 Notes: Electronic Signature(s) Signed: 07/13/2018 6:17:48 PM By: Elliot Gurney, BSN, RN, CWS, Kim RN, BSN Entered By: Elliot Gurney, BSN, RN, CWS, Kim on 07/11/2018 16:06:33 [name] ([id number]) -------------------------------------------------------------------------------- Pain Assessment Details Patient Name: [name] Date of Service: 07/11/2018 3:15 PM Medical Record Number: [id number] Patient Account Number: [id number] Date of Birth/Sex: 1930-03-05 (84 y.o. F) Treating RN: Phillis Haggis Primary Care Latroy Gaymon: Aram Beecham Other Clinician: Referring Glennis Borger: Aram Beecham Treating Terrionna Bridwell/Extender: Altamese [place] in Treatment: 16 Active Problems Location of Pain Severity and Description of Pain Patient Has Paino Yes Site Locations Pain Location: Pain in Ulcers Rate the pain. Current Pain Level: 8 Character of Pain Describe the Pain: Aching Pain Management and Medication Current Pain Management: Electronic Signature(s) Signed: 07/16/2018 4:59:33 PM By: Alejandro Mulling Entered By: Alejandro Mulling on 07/11/2018 15:39:20 [name] ([id number]) -------------------------------------------------------------------------------- Patient/Caregiver Education Details Patient Name: [name] Date of Service: 07/11/2018 3:15 PM Medical Record Number: [id number] Patient Account Number: [id number] Date of Birth/Gender: 08/14/1930 (84 y.o. F) Treating RN: Curtis Sites Primary Care Physician: Aram Beecham Other Clinician: Referring Physician: Aram Beecham Treating Physician/Extender: Altamese [place] in Treatment: 40 Education Assessment Education Provided To: Patient Education Topics Provided Wound/Skin Impairment: Handouts: Other: wound care as ordered Methods: Demonstration, Explain/Verbal Responses: State content  correctly Electronic Signature(s) Signed: 07/11/2018 5:29:24 PM By: Curtis Sites Entered By: Curtis Sites on  07/11/2018 17:07:25 [name] ([id number]) -------------------------------------------------------------------------------- Wound Assessment Details Patient Name: [name] Date of Service: 07/11/2018 3:15 PM Medical Record Number: [id number] Patient Account Number: [id number] Date of Birth/Sex: 1930-02-25 (84 y.o. F) Treating RN: Phillis Haggis Primary Care Angline Schweigert: Aram Beecham Other Clinician: Referring Jenisa Monty: Aram Beecham Treating Gennette Shadix/Extender: Altamese [place] in Treatment: 34 Wound Status Wound Number: 9 Primary Etiology: Trauma, Other Wound Location: Left Elbow Wound Status: Open Wounding Event: Trauma Comorbid Hypertension, Gout, Osteoarthritis, History: Neuropathy Date Acquired: 01/26/2018 Weeks Of Treatment: 10 Clustered Wound: No Photos Photo Uploaded By: Alejandro Mulling on 07/12/2018 07:35:40 Wound Measurements Length: (cm) 0.8 Width: (cm) 0.4 Depth: (cm) 0.2 Area: (cm) 0.251 Volume: (cm) 0.05 % Reduction in Area: -253.5% % Reduction in Volume: -257.1% Epithelialization: None Tunneling: No Undermining: Yes Starting Position (o'clock): 12 Ending Position (o'clock): 12 Maximum Distance: (cm) 1.2 Wound Description Full Thickness With Exposed Support Foul Classification: Structures Slou Wound Margin: Distinct, outline attached Exudate Large Amount: Exudate Type: Purulent Exudate Color: yellow, brown, green Odor After Cleansing: No gh/Fibrino Yes Wound Bed Granulation Amount: Small (1-33%) Exposed Structure Granulation Quality: Pink Fascia Exposed: No Necrotic Amount: Large (67-100%) Fat Layer (Subcutaneous Tissue) Exposed: Yes [name], [name] ([id number]) Necrotic Quality: Adherent Slough Tendon Exposed: No Muscle Exposed: No Joint Exposed: No Bone Exposed: Yes Periwound Skin Texture Texture Color No Abnormalities Noted: No No Abnormalities Noted: No Callus: Yes Atrophie Blanche: No Crepitus:  No Cyanosis: No Excoriation: No Ecchymosis: No Induration: No Erythema: No Rash: No Hemosiderin Staining: No Scarring: Yes Mottled: No Pallor: No Moisture Rubor: No No Abnormalities Noted: No Dry / Scaly: No Temperature / Pain Maceration: No Temperature: No Abnormality Tenderness on Palpation: Yes Wound Preparation Ulcer Cleansing: Rinsed/Irrigated with Saline Topical Anesthetic Applied: Other: lidocaine 4%, Electronic Signature(s) Signed: 07/16/2018 4:59:33 PM By: Alejandro Mulling Entered By: Alejandro Mulling on 07/11/2018 15:46:11 [name] ([id number]) -------------------------------------------------------------------------------- Vitals Details Patient Name: [name] Date of Service: 07/11/2018 3:15 PM Medical Record Number: [id number] Patient Account Number: [id number] Date of Birth/Sex: 28-Mar-1930 (84 y.o. F) Treating RN: Phillis Haggis Primary Care Audrey Thull: Aram Beecham Other Clinician: Referring Efraim Vanallen: Aram Beecham Treating Megham Dwyer/Extender: Altamese [place] in Treatment: 34 Vital Signs Time Taken: 15:39 Temperature (F): 98.3 Height (in): 60 Pulse (bpm): 70 Weight (lbs): 123 Respiratory Rate (breaths/min): 18 Body Mass Index (BMI): 24 Blood Pressure (mmHg): 120/50 Reference Range: 80 - 120 mg / dl Electronic Signature(s) Signed: 07/16/2018 4:59:33 PM By: Alejandro Mulling Entered By: Alejandro Mulling on 07/11/2018 15:41:48

## 2018-07-28 LAB — AEROBIC CULTURE  (SUPERFICIAL SPECIMEN): CULTURE: NO GROWTH

## 2018-07-28 LAB — AEROBIC CULTURE W GRAM STAIN (SUPERFICIAL SPECIMEN)

## 2018-07-30 NOTE — Progress Notes (Signed)
[name] ([id number]) Visit Report for 07/18/2018 Arrival Information Details Patient Name: [name]. Date of Service: 07/18/2018 10:15 AM Medical Record Number: [id number] Patient Account Number: [id number] Date of Birth/Sex: June 24, 1930 (84 y.o. F) Treating RN: Curtis Sites Primary Care Mandie Crabbe: Aram Beecham Other Clinician: Referring Ayona Yniguez: Aram Beecham Treating Zeeva Courser/Extender: Kathreen Cosier in Treatment: 35 Visit Information History Since Last Visit Added or deleted any medications: No Patient Arrived: Walker Any new allergies or adverse reactions: No Arrival Time: 10:22 Had a fall or experienced change in No Accompanied By: self activities of daily living that may affect Transfer Assistance: None risk of falls: Patient Identification Verified: Yes Signs or symptoms of abuse/neglect since last visito No Secondary Verification Process Yes Hospitalized since last visit: No Completed: Implantable device outside of the clinic excluding No Patient Requires Transmission-Based No cellular tissue based products placed in the center Precautions: since last visit: Patient Has Alerts: Yes Has Dressing in Place as Prescribed: Yes Patient Alerts: non compressible left Pain Present Now: No leg Electronic Signature(s) Signed: 07/18/2018 3:43:39 PM By: Curtis Sites Entered By: Curtis Sites on 07/18/2018 10:23:36 [name] ([id number]) -------------------------------------------------------------------------------- Encounter Discharge Information Details Patient Name: [name] Date of Service: 07/18/2018 10:15 AM Medical Record Number: [id number] Patient Account Number: [id number] Date of Birth/Sex: 03/19/1930 (84 y.o. F) Treating RN: Phillis Haggis Primary Care Ewart Carrera: Aram Beecham Other Clinician: Referring Chi Woodham: Aram Beecham Treating Ellisyn Icenhower/Extender: Kathreen Cosier in Treatment: 43 Encounter Discharge Information  Items Discharge Condition: Stable Ambulatory Status: Walker Discharge Destination: Home Transportation: Private Auto Accompanied By: self Schedule Follow-up Appointment: Yes Clinical Summary of Care: Electronic Signature(s) Signed: 07/18/2018 11:04:09 AM By: Alejandro Mulling Entered By: Alejandro Mulling on 07/18/2018 11:04:08 [name] ([id number]) -------------------------------------------------------------------------------- Lower Extremity Assessment Details Patient Name: [name] Date of Service: 07/18/2018 10:15 AM Medical Record Number: [id number] Patient Account Number: [id number] Date of Birth/Sex: 03-Oct-1930 (84 y.o. F) Treating RN: Curtis Sites Primary Care Stefhanie Kachmar: Aram Beecham Other Clinician: Referring Tylah Mancillas: Aram Beecham Treating Haniyah Maciolek/Extender: Bonnell Public Weeks in Treatment: 35 Electronic Signature(s) Signed: 07/18/2018 3:43:39 PM By: Curtis Sites Entered By: Curtis Sites on 07/18/2018 10:25:26 [name] ([id number]) -------------------------------------------------------------------------------- Multi Wound Chart Details Patient Name: [name] Date of Service: 07/18/2018 10:15 AM Medical Record Number: [id number] Patient Account Number: [id number] Date of Birth/Sex: [birth year] (84 y.o. F) Treating RN: Huel Coventry Primary Care Darrill Vreeland: Aram Beecham Other Clinician: Referring Adrieana Fennelly: Aram Beecham Treating Elenor Wildes/Extender: Kathreen Cosier in Treatment: 35 Vital Signs Height(in): 60 Pulse(bpm): 87 Weight(lbs): 123 Blood Pressure(mmHg): 114/85 Body Mass Index(BMI): 24 Temperature(F): 97.8 Respiratory Rate 18 (breaths/min): Photos: [9:No Photos] [N/A:N/A] Wound Location: [9:Left Elbow] [N/A:N/A] Wounding Event: [9:Trauma] [N/A:N/A] Primary Etiology: [9:Trauma, Other] [N/A:N/A] Comorbid History: [9:Hypertension, Gout, Osteoarthritis, Neuropathy] [N/A:N/A] Date Acquired: [9:01/26/2018] [N/A:N/A] Weeks  of Treatment: [9:11] [N/A:N/A] Wound Status: [9:Open] [N/A:N/A] Measurements L x W x D [9:0.8x0.4x0.2] [N/A:N/A] (cm) Area (cm) : [9:0.251] [N/A:N/A] Volume (cm) : [9:0.05] [N/A:N/A] % Reduction in Area: [9:-253.50%] [N/A:N/A] % Reduction in Volume: [9:-257.10%] [N/A:N/A] Starting Position 1 [9:1] (o'clock): Ending Position 1 [9:1] (o'clock): Maximum Distance 1 (cm): [9:1.2] Undermining: [9:Yes] [N/A:N/A] Classification: [9:Full Thickness With Exposed Support Structures] [N/A:N/A] Exudate Amount: [9:Large] [N/A:N/A] Exudate Type: [9:Purulent] [N/A:N/A] Exudate Color: [9:yellow, brown, green] [N/A:N/A] Wound Margin: [9:Distinct, outline attached] [N/A:N/A] Granulation Amount: [9:Small (1-33%)] [N/A:N/A] Granulation Quality: [9:Pink] [N/A:N/A] Necrotic Amount: [9:Large (67-100%)] [N/A:N/A] Exposed Structures: [9:Fat Layer (Subcutaneous Tissue) Exposed: Yes Bone: Yes Fascia: No Tendon: No Muscle: No Joint: No] [N/A:N/A] Epithelialization: None N/A  N/A Debridement: Debridement - Selective/Open N/A N/A Wound Pre-procedure 10:52 N/A N/A Verification/Time Out Taken: Pain Control: Other N/A N/A Tissue Debrided: Slough N/A N/A Level: Skin/Dermis N/A N/A Debridement Area (sq cm): 0.32 N/A N/A Instrument: Blade N/A N/A Bleeding: Minimum N/A N/A Hemostasis Achieved: Pressure N/A N/A Debridement Treatment Procedure was tolerated well N/A N/A Response: Post Debridement 0.8x0.4x0.2 N/A N/A Measurements L x W x D (cm) Post Debridement Volume: 0.05 N/A N/A (cm) Periwound Skin Texture: Callus: Yes N/A N/A Scarring: Yes Excoriation: No Induration: No Crepitus: No Rash: No Periwound Skin Moisture: Maceration: No N/A N/A Dry/Scaly: No Periwound Skin Color: Erythema: Yes N/A N/A Atrophie Blanche: No Cyanosis: No Ecchymosis: No Hemosiderin Staining: No Mottled: No Pallor: No Rubor: No Erythema Location: Circumferential N/A N/A Temperature: No Abnormality N/A  N/A Tenderness on Palpation: Yes N/A N/A Wound Preparation: Ulcer Cleansing: N/A N/A Rinsed/Irrigated with Saline Topical Anesthetic Applied: Other: lidocaine 4% Procedures Performed: Debridement N/A N/A Treatment Notes Electronic Signature(s) Signed: 07/18/2018 10:55:51 AM By: Bonnell Public Entered By: Bonnell Public on 07/18/2018 10:55:51 [name] ([id number]) -------------------------------------------------------------------------------- Multi-Disciplinary Care Plan Details Patient Name: [name] Date of Service: 07/18/2018 10:15 AM Medical Record Number: [id number] Patient Account Number: [id number] Date of Birth/Sex: [birth year] (84 y.o. F) Treating RN: Huel Coventry Primary Care Berdene Askari: Aram Beecham Other Clinician: Referring Anton Cheramie: Aram Beecham Treating Marlan Steward/Extender: Kathreen Cosier in Treatment: 65 Active Inactive ` Soft Tissue Infection Nursing Diagnoses: Impaired tissue integrity Goals: Patient's soft tissue infection will resolve Date Initiated: 07/18/2018 Target Resolution Date: 07/27/2018 Goal Status: Active Interventions: Assess signs and symptoms of infection every visit Treatment Activities: Culture and sensitivity : 07/18/2018 Systemic antibiotics : 07/18/2018 Notes: ` Wound/Skin Impairment Nursing Diagnoses: Impaired tissue integrity Knowledge deficit related to ulceration/compromised skin integrity Goals: Patient/caregiver will verbalize understanding of skin care regimen Date Initiated: 11/14/2017 Target Resolution Date: 11/28/2017 Goal Status: Active Ulcer/skin breakdown will have a volume reduction of 30% by week 4 Date Initiated: 11/14/2017 Target Resolution Date: 11/28/2017 Goal Status: Active Interventions: Assess patient/caregiver ability to obtain necessary supplies Assess patient/caregiver ability to perform ulcer/skin care regimen upon admission and as needed Assess ulceration(s) every visit Treatment  Activities: Skin care regimen initiated : 11/14/2017 NATAISHA, UTHOFF ([id number]) Notes: Electronic Signature(s) Signed: 07/18/2018 5:11:54 PM By: Elliot Gurney, BSN, RN, CWS, Kim RN, BSN Entered By: Elliot Gurney, BSN, RN, CWS, Kim on 07/18/2018 10:51:59 [name] ([id number]) -------------------------------------------------------------------------------- Pain Assessment Details Patient Name: [name] Date of Service: 07/18/2018 10:15 AM Medical Record Number: [id number] Patient Account Number: [id number] Date of Birth/Sex: Nov 23, 1930 (84 y.o. F) Treating RN: Curtis Sites Primary Care Izaya Netherton: Aram Beecham Other Clinician: Referring Lurleen Soltero: Aram Beecham Treating Sula Fetterly/Extender: Kathreen Cosier in Treatment: 29 Active Problems Location of Pain Severity and Description of Pain Patient Has Paino Yes Site Locations Pain Location: Pain in Ulcers With Dressing Change: Yes Duration of the Pain. Constant / Intermittento Intermittent Pain Management and Medication Current Pain Management: Electronic Signature(s) Signed: 07/18/2018 3:43:39 PM By: Curtis Sites Entered By: Curtis Sites on 07/18/2018 10:25:15 [name] ([id number]) -------------------------------------------------------------------------------- Patient/Caregiver Education Details Patient Name: [name] Date of Service: 07/18/2018 10:15 AM Medical Record Number: [id number] Patient Account Number: [id number] Date of Birth/Gender: 03/19/1930 (84 y.o. F) Treating RN: Phillis Haggis Primary Care Physician: Aram Beecham Other Clinician: Referring Physician: Aram Beecham Treating Physician/Extender: Kathreen Cosier in Treatment: 19 Education Assessment Education Provided To: Patient Education Topics Provided Wound/Skin Impairment: Handouts: Caring for Your Ulcer, Skin Care Do's and Dont's, Other: change  dressing as ordered Methods: Demonstration, Explain/Verbal Responses: State  content correctly Electronic Signature(s) Signed: 07/19/2018 4:46:33 PM By: Alejandro Mulling Entered By: Alejandro Mulling on 07/18/2018 11:04:23 [name] ([id number]) -------------------------------------------------------------------------------- Wound Assessment Details Patient Name: [name] Date of Service: 07/18/2018 10:15 AM Medical Record Number: [id number] Patient Account Number: [id number] Date of Birth/Sex: 07/13/1930 (84 y.o. F) Treating RN: Curtis Sites Primary Care Kadisha Goodine: Aram Beecham Other Clinician: Referring Maximos Zayas: Aram Beecham Treating Blanca Thornton/Extender: Kathreen Cosier in Treatment: 35 Wound Status Wound Number: 9 Primary Etiology: Trauma, Other Wound Location: Left Elbow Wound Status: Open Wounding Event: Trauma Comorbid Hypertension, Gout, Osteoarthritis, History: Neuropathy Date Acquired: 01/26/2018 Weeks Of Treatment: 11 Clustered Wound: No Photos Photo Uploaded By: Curtis Sites on 07/18/2018 11:47:46 Wound Measurements Length: (cm) 0.8 Width: (cm) 0.4 Depth: (cm) 0.2 Area: (cm) 0.251 Volume: (cm) 0.05 % Reduction in Area: -253.5% % Reduction in Volume: -257.1% Epithelialization: None Tunneling: No Undermining: Yes Starting Position (o'clock): 1 Ending Position (o'clock): 1 Maximum Distance: (cm) 1.2 Wound Description Full Thickness With Exposed Support Foul Classification: Structures Sloug Wound Margin: Distinct, outline attached Exudate Large Amount: Exudate Type: Purulent Exudate Color: yellow, brown, green Odor After Cleansing: No h/Fibrino Yes Wound Bed Granulation Amount: Small (1-33%) Exposed Structure Granulation Quality: Pink Fascia Exposed: No Necrotic Amount: Large (67-100%) Fat Layer (Subcutaneous Tissue) Exposed: Yes LATISHIA, SUITT ([id number]) Necrotic Quality: Adherent Slough Tendon Exposed: No Muscle Exposed: No Joint Exposed: No Bone Exposed: Yes Periwound Skin Texture Texture  Color No Abnormalities Noted: No No Abnormalities Noted: No Callus: Yes Atrophie Blanche: No Crepitus: No Cyanosis: No Excoriation: No Ecchymosis: No Induration: No Erythema: Yes Rash: No Erythema Location: Circumferential Scarring: Yes Hemosiderin Staining: No Mottled: No Moisture Pallor: No No Abnormalities Noted: No Rubor: No Dry / Scaly: No Maceration: No Temperature / Pain Temperature: No Abnormality Tenderness on Palpation: Yes Wound Preparation Ulcer Cleansing: Rinsed/Irrigated with Saline Topical Anesthetic Applied: Other: lidocaine 4%, Treatment Notes Wound #9 (Left Elbow) 1. Cleansed with: Clean wound with Normal Saline 2. Anesthetic Topical Lidocaine 4% cream to wound bed prior to debridement 3. Peri-wound Care: Skin Prep 4. Dressing Applied: Iodoflex 5. Secondary Dressing Applied Dry Gauze [place] Electronic Signature(s) Signed: 07/18/2018 3:43:39 PM By: Curtis Sites Entered By: Curtis Sites on 07/18/2018 10:30:11 [name] ([id number]) -------------------------------------------------------------------------------- Vitals Details Patient Name: [name] Date of Service: 07/18/2018 10:15 AM Medical Record Number: [id number] Patient Account Number: [id number] Date of Birth/Sex: 08/08/1930 (84 y.o. F) Treating RN: Curtis Sites Primary Care Gae Bihl: Aram Beecham Other Clinician: Referring Tyreek Clabo: Aram Beecham Treating Yisrael Obryan/Extender: Kathreen Cosier in Treatment: 35 Vital Signs Time Taken: 10:23 Temperature (F): 97.8 Height (in): 60 Pulse (bpm): 87 Weight (lbs): 123 Respiratory Rate (breaths/min): 18 Body Mass Index (BMI): 24 Blood Pressure (mmHg): 114/85 Reference Range: 80 - 120 mg / dl Electronic Signature(s) Signed: 07/18/2018 3:43:39 PM By: Curtis Sites Entered By: Curtis Sites on 07/18/2018 10:23:58

## 2018-07-30 NOTE — Progress Notes (Signed)
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 nothing too significant unfortunately. I still can probe bone in the proximal portion of the wound which obviously explain to the patient is not good. She will be having a follow-up with her orthopedic surgeon in the next couple of weeks. In the meantime we are trying to do as much as we can to try to show signs of improvement in healing to avoid the need for any additional and further surgery. Nonetheless I explained to the patient yet again today I'm not sure if that is going to be feasible or not obviously it's more risk for her to continue to have an open wound with bone exposure then to the back in for additional surgery even though I know she doesn't want to go that route. 05/15/18 on evaluation today patient presents for follow-up concerning her ongoing lower extremity ulcers on the left as well as the left elbow ulcer. She has at this point in time been tolerating the dressing changes without complication. Her left lower extremity ulcer appears to be doing very well. In regard to the left elbow ulcer she actually does seem to have additional granulation today which is good news. I am definitely seeing  signs of improvement although obviously this is somewhat slow improvement. Nonetheless I'm hopeful we will be able to avoid her having to have any further surgery but again that would definitely be a conversation between herself as well as her surgeon once he sees her for reevaluation. Otherwise she does want to see about having a three order compression stockings for her today 05/21/18 on evaluation today patient appears to be doing well in regard to her left lower surety ulcer. This is almost completely healed and seems to be progressing very nicely. With that being  said her left elbow is another story. I'm not really convinced in the past three weeks we've seen a significant improvement in this wound. With that being said if this is something that there is no surgical option for him we have to continue to work on this from the standpoint of conservative management with wound care she may make improvement given time. Nonetheless it appears that her surgeon is somewhat concerned about the possibility of infection and really is leaning towards additional surgery to try and help close this wound. Nonetheless the patient is still unsure of exactly what to do. 05/29/18 on evaluation today patient appears to be doing well in regard to her left lower extremity ulcer. She's been tolerating the dressing changes without complication which is good news. With that being said she's been having issues specifically with her elbow she did see her surgeon Dr. Joice Lofts and he is recommending a repeat surgery to the left elbow in order to correct the issue. The patient is still somewhat unsure of this but feels like this may be better than trying to take time to let this heal over a longer period of time through normal wound care measures. Again I explained that I agree this may be a faster way to go if her surgeon feels that this is indeed a good direction to take. Obviously only he can make the judgment on whether or  not the surgery would likely be successful. DESHANTA, LADY ([id number]) 06/04/18 on evaluation today patient actually presents for follow-up concerning her left elbow and left lower from the ulcer she seems to be doing very well at this point in time. She has been tolerating the dressing changes without complication. With that being said her elbow is not significantly better she actually is scheduled for surgery tomorrow. 07/04/18; the patient had an area on her left leg that is remaining closed. The open area she has now is a postsurgical wound on the left elbow. I think we have clearance from the surgeon to see this now. We're using Prisma 07/11/18; we're currently dealing with a surgical wound on the left olecranon process. The patient complains of a lot of pain and drainage. When I saw her last week we did an x-ray that showed soft tissue wound and probable elbow joint effusion but no erosion to suggest osteomyelitis. The culture I did of this was somewhat surprisingly negative. She has a small open wound with not a viable surface there is considerable undermining relative to the wound size. She is on methotrexate for rheumatoid arthritis/overlap syndrome also plaquenil. We've been using silver collagen 07/18/18-She is seen in follow-up laceration for a left elbow wound. There is essentially no change. She is currently on Zithromax and will complete that on Friday, there is no indication to extend this. We will change to iodosorb/iodoflex and monitor for response Patient History Social History Never smoker. Medical History Eyes Patient has history of Cataracts, Glaucoma, Optic Neuritis Ear/Nose/Mouth/Throat Patient has history of Chronic sinus problems/congestion, Middle ear problems Medical And Surgical History Notes Constitutional Symptoms (General Health) Back pain Ear/Nose/Mouth/Throat bilateral hearing aides Review of Systems (ROS) Constitutional Symptoms (General  Health) Complains or has symptoms of Fatigue, Fever, Chills. Objective Constitutional Vitals Time Taken: 10:23 AM, Height: 60 in, Weight: 123 lbs, BMI: 24, Temperature: 97.8 F, Pulse: 87 bpm, Respiratory Rate: 18 breaths/min, Blood Pressure: 114/85 mmHg. Integumentary (Hair, Skin) Wound #9 status is Open. Original cause of wound was Trauma. The wound is located on the Left Elbow.  The wound measures 0.8cm length x 0.4cm width x 0.2cm depth; 0.251cm^2 area and 0.05cm^3 volume. There is bone and Fat Layer (Subcutaneous Tissue) Exposed exposed. There is no tunneling noted, however, there is undermining starting at 1:00 and ending at 1:00 with a maximum distance of 1.2cm. There is a large amount of purulent drainage noted. The wound margin is distinct with the outline attached to the wound base. There is small (1-33%) pink granulation within the wound bed. There is a large (67-100%) amount of necrotic tissue within the wound bed including Adherent Slough. The periwound skin appearance exhibited: Callus, Scarring, Erythema. The periwound skin appearance did not exhibit: Crepitus, Excoriation, Induration, Rash, Dry/Scaly, Maceration, Atrophie Blanche, Cyanosis, Ecchymosis, Hemosiderin Staining, Mottled, Pallor, Rubor. The JURLINE, FOLGER ([id number]) surrounding wound skin color is noted with erythema which is circumferential. Periwound temperature was noted as No Abnormality. The periwound has tenderness on palpation. 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 There was a Selective/Open Wound Skin/Dermis Debridement with a total area of 0.32 sq cm performed by Bonnell Public, NP. With the following instrument(s): Blade to remove Non-Viable tissue/material.  Material removed includes Slough and Skin: Dermis and after achieving pain control using Other (lidocaine 4%). No specimens were taken. A time out was conducted at 10:52, prior to the start of the procedure. A Minimum amount of bleeding was controlled with Pressure. The procedure was tolerated well. Patient s Level of Consciousness post procedure was recorded as Awake and Alert. Post Debridement Measurements: 0.8cm length x 0.4cm width x 0.2cm depth; 0.05cm^3 volume. Character of Wound/Ulcer Post Debridement is stable. Post procedure Diagnosis Wound #9: Same as Pre-Procedure Plan Wound Cleansing: Wound #9 Left Elbow: Clean wound with Normal Saline. Anesthetic (add to Medication List): Wound #9 Left Elbow: Topical Lidocaine 4% cream applied to wound bed prior to debridement (In Clinic Only). Primary Wound Dressing: Wound #9 Left Elbow: Iodosorb Ointment Secondary Dressing: Wound #9 Left Elbow: Boardered Foam Dressing Dressing Change Frequency: Wound #9 Left Elbow: Change Dressing Monday, Wednesday, Friday - clean out left over dressing with saline and q-tip before applying new dressing. Follow-up Appointments: Wound #9 Left Elbow: Return Appointment in 1 week. RENNIE, HACK VMarland Kitchen ([id number]) Off-Loading: Wound #9 Left Elbow: Other: - Keep pressure off of elbow. Medications-please add to medication list.: Wound #9 Left Elbow: P.O. Antibiotics - complete antibiotics Electronic Signature(s) Signed: 07/18/2018 10:58:01 AM By: Bonnell Public Entered By: Bonnell Public on 07/18/2018 10:58:01 [name] ([id number]) -------------------------------------------------------------------------------- ROS/PFSH Details Patient Name: [name] Date of Service: 07/18/2018 10:15 AM Medical Record Number: [id number] Patient Account Number: [id number] Date of Birth/Sex: Apr 20, 1930 (84 y.o. F) Treating RN: Huel Coventry Primary Care Provider: Aram Beecham Other Clinician: Referring  Provider: Aram Beecham Treating Provider/Extender: Kathreen Cosier in Treatment: 35 Wound History Constitutional Symptoms (General Health) Complaints and Symptoms: Positive for: Fatigue; Fever; Chills Medical History: Past Medical History Notes: Back pain Eyes Medical History: Positive for: Cataracts; Glaucoma; Optic Neuritis Ear/Nose/Mouth/Throat Medical History: Positive for: Chronic sinus problems/congestion; Middle ear problems Past Medical History Notes: bilateral hearing aides HBO Extended History Items Ear/Nose/Mouth/Throat: Eyes: Eyes: Ear/Nose/Mouth/Throat: Chronic sinus Cataracts Glaucoma Middle ear problems problems/congestion Implantable Devices Family and Social History Never smoker Electronic Signature(s) Unsigned Entered ByBonnell Public on 07/18/2018 10:57:38 Signature(s): Date(s): ANDORA, KRULL VMarland Kitchen ([id number]) --------------------------------------------------------------------------------  SuperBill Details Patient Name: KALEYA, DEER. Date of Service: 07/18/2018 Medical Record Number: [id number] Patient Account Number: [id number] Date of Birth/Sex: 19-Nov-1930 (84 y.o. 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 Facility Procedures CPT4: Description Modifier Quantity Code [id number] - DEBRIDE WOUND 1ST 20 SQ CM OR < 1 ICD-10 Diagnosis Description S51.002S Unspecified open wound of left elbow, sequela L98.495 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  involvement without evidence of necrosis Physician Procedures CPT4: Description Modifier Quantity Code [id number] - WC  PHYS DEBR WO ANESTH 20 SQ CM 1 ICD-10 Diagnosis Description S51.002S Unspecified open wound of left elbow, sequela L98.495 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  involvement without evidence of necrosis Electronic Signature(s) Signed: 07/18/2018 10:59:19 AM By: Bonnell Public Entered By: Bonnell Public on 07/18/2018 10:59:18

## 2018-08-01 ENCOUNTER — Encounter: Payer: Medicare Other | Admitting: Internal Medicine

## 2018-08-01 DIAGNOSIS — L98495 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: Secondary | ICD-10-CM | POA: Diagnosis not present

## 2018-08-08 ENCOUNTER — Encounter: Payer: Medicare Other | Admitting: Internal Medicine

## 2018-08-08 DIAGNOSIS — L98495 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: Secondary | ICD-10-CM | POA: Diagnosis not present

## 2018-08-08 NOTE — Progress Notes (Signed)
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 05/11/18 on evaluation today patient appears to be doing very well in regard to her left lower extremity ulcers. Unfortunately in regard to her elbow this is very slow coming about as far as any improvement is concerned. I do feel like there may be a little bit more granulation noted in the base of the wound but nothing too significant unfortunately. I still can probe bone in the proximal portion of the wound which obviously explain to the patient is not good. She will be having a follow-up with her orthopedic surgeon in the next couple of weeks. In the meantime we are trying to do as much as we can to try to show signs of improvement in healing to avoid the need for any additional and further surgery. Nonetheless I explained to the patient yet again today I'm not sure if that is going to be feasible or not obviously it's more risk for her to continue to have an open wound with bone exposure then to the back in for additional surgery even though I know she doesn't want to go that route. 05/15/18 on evaluation today patient presents for follow-up concerning her ongoing lower extremity ulcers on the left as well as the left elbow ulcer. She has at  this point in time been tolerating the dressing changes without complication. Her left lower extremity ulcer appears to be doing very well. In regard to the left elbow ulcer she actually does seem to have additional granulation today which is good news. I am definitely seeing signs of improvement although obviously this is somewhat slow improvement. Nonetheless I'm hopeful we will be able to avoid her having to have any further surgery but again that would definitely be a conversation between herself as well as her surgeon once he sees her for reevaluation. Otherwise she does want to see about having a  three order compression stockings for her today 05/21/18 on evaluation today patient appears to be doing well in regard to her left lower surety ulcer. This is almost completely healed and seems to be progressing very nicely. With that being said her left elbow is another story. I'm not really convinced in the past three weeks we've seen a significant improvement in this wound. With that being said if this is something that there is no surgical option for him we have to continue to work on this from the standpoint of conservative management with wound care she may make improvement given time. Nonetheless it appears that her surgeon is somewhat concerned about the possibility of infection and really is leaning towards additional surgery to try and help close this wound. Nonetheless the patient is still unsure of exactly what to do. 05/29/18 on evaluation today patient appears to be doing well in regard to her left lower extremity ulcer. She's been tolerating the dressing changes without complication which is good news. With that being said she's been having issues specifically with her elbow she did see her surgeon Dr. Joice Lofts and he is recommending a repeat surgery to the left elbow in order to correct the issue. The patient is still somewhat unsure of this but feels like this may be better than trying to  take time to let this heal over a longer period of time through normal wound care measures. Again I explained that I agree this may be a faster way to go if her surgeon feels that this is indeed a good direction to take. Obviously only he can make the judgment on whether or not the surgery would likely be successful. [name], [name] ([id number]) 06/04/18 on evaluation today patient actually presents for follow-up concerning her left elbow and left lower from the ulcer she seems to be doing very well at this point in time. She has been tolerating the dressing changes without complication. With that being said her elbow is not significantly better she actually is scheduled for surgery tomorrow. 07/04/18; the patient had an area on her left leg that is remaining closed. The open area she has now is a postsurgical wound on the left elbow. I think we have clearance from the surgeon to see this now. We're using Prisma 07/11/18; we're currently dealing with a surgical wound on the left olecranon process. The patient complains of a lot of pain and drainage. When I saw her last week we did an x-ray that showed soft tissue wound and probable elbow joint effusion but no erosion to suggest osteomyelitis. The culture I did of this was somewhat surprisingly negative. She has a small open wound with not a viable surface there is considerable undermining relative to the wound size. She is on methotrexate for rheumatoid arthritis/overlap syndrome also plaquenil. We've been using silver collagen 07/18/18-She is seen in follow-up evaluation for a left elbow wound. There is essentially no change. She is currently on Zithromax and will complete that on Friday, there is no indication to extend this. We will change to iodosorb/iodoflex and monitor for response 07/25/18-She is seen in follow-up evaluation for left elbow wound. The wound is stable with no overt evidence of infection. She has counseled with her rheumatologist.  She is wanting to restart her methotrexate; a culture was obtained to rule out occult infection before starting her methotrexate. We will continue with Iodosorb/Iodoflex and she will follow-up next week. Objective Constitutional Vitals Time Taken: 3:05 PM, Height: 60 in, Weight: 123 lbs,  BMI: 24, Temperature: 98.4 F, Pulse: 77 bpm, Respiratory Rate: 18 breaths/min, Blood Pressure: 126/51 mmHg. Integumentary (Hair, Skin) Wound #9 status is Open. Original cause of wound was Trauma. The wound is located on the Left Elbow. The wound measures 0.7cm length x 0.4cm width x 0.2cm depth; 0.22cm^2 area and 0.044cm^3 volume. There is bone and Fat Layer (Subcutaneous Tissue) Exposed exposed. There is no tunneling or undermining noted. There is a large amount of serous drainage noted. The wound margin is distinct with the outline attached to the wound base. There is small (1-33%) pink granulation within the wound bed. There is a large (67-100%) amount of necrotic tissue within the wound bed including Eschar and Adherent Slough. The periwound skin appearance exhibited: Callus, Scarring, Erythema. The periwound skin appearance did not exhibit: Crepitus, Excoriation, Induration, Rash, Dry/Scaly, Maceration, Atrophie Blanche, Cyanosis, Ecchymosis, Hemosiderin Staining, Mottled, Pallor, Rubor. The surrounding wound skin color is noted with erythema which is circumferential. Periwound temperature was noted as No Abnormality. The periwound has tenderness on palpation. 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 There was a Excisional Skin/Subcutaneous Tissue Debridement with a total area of 0.28 sq cm performed by Bonnell Public, NP. With the  following instrument(s): Curette to remove Viable and Non-Viable tissue/material. Material removed includes Subcutaneous Tissue, Skin: Dermis, and Skin: Epidermis after achieving pain control using Other (lidocaine 4%). No specimens were taken. A time out was conducted at 15:19, prior to the start of the procedure. A Minimum amount of bleeding was controlled with Pressure. The procedure was tolerated well. Patient s Level of Consciousness post procedure was recorded as Awake and Alert. Post Debridement Measurements: 0.7cm length x 0.4cm width x 0.2cm depth; 0.044cm^3 volume. Character of Wound/Ulcer Post Debridement requires further debridement. Post procedure Diagnosis Wound #9: Same as Pre-Procedure Plan Wound Cleansing: Wound #9 Left Elbow: Clean wound with Normal Saline. Anesthetic (add to Medication List): Wound #9 Left Elbow: Topical Lidocaine 4% cream applied to wound bed prior to debridement (In Clinic Only). Primary Wound Dressing: Wound #9 Left Elbow: Iodosorb Ointment Secondary Dressing: Wound #9 Left Elbow: Boardered Foam Dressing Dressing Change Frequency: Wound #9 Left Elbow: Change Dressing Monday, Wednesday, Friday - clean out left over dressing with saline and q-tip before applying new dressing. Follow-up Appointments: Wound #9 Left Elbow: Return Appointment in 1 week. Off-Loading: Wound #9 Left Elbow: Other: - Keep pressure off of elbow. Laboratory ordered were: Wound culture routine - Left Elbow Electronic Signature(s) Signed: 07/25/2018 4:26:46 PM By: Bonnell Public Entered By: Bonnell Public on 07/25/2018 16:26:45 [name] ([id number]) -------------------------------------------------------------------------------- SuperBill Details Patient Name: [name] Date of Service: 07/25/2018 Medical Record Number: [id number] Patient Account Number: [id number] Date of Birth/Sex: May 22, 1930 (84 y.o. 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

## 2018-08-09 NOTE — Progress Notes (Signed)
[name], [name] ([id number]) Visit Report for 07/25/2018 Arrival Information Details Patient Name: [name], [name] Date of Service: 07/25/2018 3:00 PM Medical Record Number: [id number] Patient Account Number: [id number] Date of Birth/Sex: [birth year] (84 y.o. F) Treating RN: Phillis Haggis Primary Care Dailah Opperman: Aram Beecham Other Clinician: Referring Rajvi Armentor: Aram Beecham Treating Tanaka Gillen/Extender: Kathreen Cosier in Treatment: 40 Visit Information History Since Last Visit All ordered tests and consults were completed: No Patient Arrived: [name] Added or deleted any medications: No Arrival Time: 15:04 Any new allergies or adverse reactions: No Accompanied By: caregiver Had a fall or experienced change in No Transfer Assistance: EasyPivot Patient activities of daily living that may affect Lift risk of falls: Patient Identification Verified: Yes Signs or symptoms of abuse/neglect since last visito No Secondary Verification Process Yes Hospitalized since last visit: No Completed: Implantable device outside of the clinic excluding No Patient Requires Transmission-Based No cellular tissue based products placed in the center Precautions: since last visit: Patient Has Alerts: Yes Has Dressing in Place as Prescribed: Yes Patient Alerts: non compressible left Pain Present Now: No leg Electronic Signature(s) Signed: 07/30/2018 5:34:19 PM By: Alejandro Mulling Entered By: Alejandro Mulling on 07/25/2018 15:05:47 [name] ([id number]) -------------------------------------------------------------------------------- Encounter Discharge Information Details Patient Name: [name] Date of Service: 07/25/2018 3:00 PM Medical Record Number: [id number] Patient Account Number: [id number] Date of Birth/Sex: 01-24-1930 (84 y.o. F) Treating RN: Curtis Sites Primary Care Diamonte Stavely: Aram Beecham Other Clinician: Referring Kitt Minardi: Aram Beecham Treating  Kenshawn Maciolek/Extender: Kathreen Cosier in Treatment: 23 Encounter Discharge Information Items Discharge Condition: Stable Ambulatory Status: Walker Discharge Destination: Home Transportation: Private Auto Accompanied By: Caregiver Schedule Follow-up Appointment: Yes Clinical Summary of Care: Electronic Signature(s) Signed: 07/25/2018 9:27:36 PM By: Curtis Sites Entered By: Curtis Sites on 07/25/2018 21:27:36 [name] ([id number]) -------------------------------------------------------------------------------- Lower Extremity Assessment Details Patient Name: [name] Date of Service: 07/25/2018 3:00 PM Medical Record Number: [id number] Patient Account Number: [id number] Date of Birth/Sex: March 17, 1930 (84 y.o. F) Treating RN: Phillis Haggis Primary Care Lenward Able: Aram Beecham Other Clinician: Referring Jaclyn Carew: Aram Beecham Treating Stephenie Navejas/Extender: Bonnell Public Weeks in Treatment: 36 Electronic Signature(s) Signed: 07/30/2018 5:34:19 PM By: Alejandro Mulling Entered By: Alejandro Mulling on 07/25/2018 15:08:00 [name] ([id number]) -------------------------------------------------------------------------------- Multi Wound Chart Details Patient Name: [name] Date of Service: 07/25/2018 3:00 PM Medical Record Number: [id number] Patient Account Number: [id number] Date of Birth/Sex: 08-19-1930 (84 y.o. F) Treating RN: Huel Coventry Primary Care Jaben Benegas: Aram Beecham Other Clinician: Referring Samhita Kretsch: Aram Beecham Treating Skyy Nilan/Extender: Kathreen Cosier in Treatment: 36 Vital Signs Height(in): 60 Pulse(bpm): 77 Weight(lbs): 123 Blood Pressure(mmHg): 126/51 Body Mass Index(BMI): 24 Temperature(F): 98.4 Respiratory Rate 18 (breaths/min): Photos: [9:No Photos] [N/A:N/A] Wound Location: [9:Left Elbow] [N/A:N/A] Wounding Event: [9:Trauma] [N/A:N/A] Primary Etiology: [9:Trauma, Other] [N/A:N/A] Comorbid History:  [9:Cataracts, Glaucoma, Optic Neuritis, Chronic sinus problems/congestion, Middle ear problems] [N/A:N/A] Date Acquired: [9:01/26/2018] [N/A:N/A] Weeks of Treatment: [9:12] [N/A:N/A] Wound Status: [9:Open] [N/A:N/A] Measurements L x W x D [9:0.7x0.4x0.2] [N/A:N/A] (cm) Area (cm) : [9:0.22] [N/A:N/A] Volume (cm) : [9:0.044] [N/A:N/A] % Reduction in Area: [9:-209.90%] [N/A:N/A] % Reduction in Volume: [9:-214.30%] [N/A:N/A] Classification: [9:Full Thickness With Exposed Support Structures] [N/A:N/A] Exudate Amount: [9:Large] [N/A:N/A] Exudate Type: [9:Serous] [N/A:N/A] Exudate Color: [9:amber] [N/A:N/A] Wound Margin: [9:Distinct, outline attached] [N/A:N/A] Granulation Amount: [9:Small (1-33%)] [N/A:N/A] Granulation Quality: [9:Pink] [N/A:N/A] Necrotic Amount: [9:Large (67-100%)] [N/A:N/A] Necrotic Tissue: [9:Eschar, Adherent Slough] [N/A:N/A] Exposed Structures: [9:Fat Layer (Subcutaneous Tissue) Exposed: Yes Bone: Yes Fascia: No Tendon: No Muscle: No Joint: No] [N/A:N/A] Epithelialization: [  9:None] [N/A:N/A] Debridement: [9:Debridement - Excisional 15:19] [N/A:N/A N/A] Pre-procedure Verification/Time Out Taken: Pain Control: Other N/A N/A Tissue Debrided: Subcutaneous N/A N/A Level: Skin/Subcutaneous Tissue N/A N/A Debridement Area (sq cm): 0.28 N/A N/A Instrument: Curette N/A N/A Bleeding: Minimum N/A N/A Hemostasis Achieved: Pressure N/A N/A Debridement Treatment Procedure was tolerated well N/A N/A Response: Post Debridement 0.7x0.4x0.2 N/A N/A Measurements L x W x D (cm) Post Debridement Volume: 0.044 N/A N/A (cm) Periwound Skin Texture: Callus: Yes N/A N/A Scarring: Yes Excoriation: No Induration: No Crepitus: No Rash: No Periwound Skin Moisture: Maceration: No N/A N/A Dry/Scaly: No Periwound Skin Color: Erythema: Yes N/A N/A Atrophie Blanche: No Cyanosis: No Ecchymosis: No Hemosiderin Staining: No Mottled: No Pallor: No Rubor: No Erythema Location:  Circumferential N/A N/A Temperature: No Abnormality N/A N/A Tenderness on Palpation: Yes N/A N/A Wound Preparation: Ulcer Cleansing: N/A N/A Rinsed/Irrigated with Saline Topical Anesthetic Applied: Other: lidocaine 4% Procedures Performed: Debridement N/A N/A Treatment Notes Electronic Signature(s) Signed: 07/25/2018 3:51:46 PM By: Bonnell Public Entered By: Bonnell Public on 07/25/2018 15:51:45 [name] ([id number]) -------------------------------------------------------------------------------- Multi-Disciplinary Care Plan Details Patient Name: [name] Date of Service: 07/25/2018 3:00 PM Medical Record Number: [id number] Patient Account Number: [id number] Date of Birth/Sex: 03/24/1930 (84 y.o. F) Treating RN: Huel Coventry Primary Care Minerva Bluett: Aram Beecham Other Clinician: Referring Sue Mcalexander: Aram Beecham Treating Joann Jorge/Extender: Kathreen Cosier in Treatment: 35 Active Inactive ` Soft Tissue Infection Nursing Diagnoses: Impaired tissue integrity Goals: Patient's soft tissue infection will resolve Date Initiated: 07/18/2018 Target Resolution Date: 07/27/2018 Goal Status: Active Interventions: Assess signs and symptoms of infection every visit Treatment Activities: Culture and sensitivity : 07/18/2018 Systemic antibiotics : 07/18/2018 Notes: ` Wound/Skin Impairment Nursing Diagnoses: Impaired tissue integrity Knowledge deficit related to ulceration/compromised skin integrity Goals: Patient/caregiver will verbalize understanding of skin care regimen Date Initiated: 11/14/2017 Target Resolution Date: 11/28/2017 Goal Status: Active Ulcer/skin breakdown will have a volume reduction of 30% by week 4 Date Initiated: 11/14/2017 Target Resolution Date: 11/28/2017 Goal Status: Active Interventions: Assess patient/caregiver ability to obtain necessary supplies Assess patient/caregiver ability to perform ulcer/skin care regimen upon admission and as  needed Assess ulceration(s) every visit Treatment Activities: Skin care regimen initiated : 11/14/2017 [name], [name] ([id number]) Notes: Electronic Signature(s) Signed: 07/25/2018 5:18:50 PM By: Elliot Gurney, BSN, RN, CWS, Kim RN, BSN Entered By: Elliot Gurney, BSN, RN, CWS, Kim on 07/25/2018 15:17:11 [name] ([id number]) -------------------------------------------------------------------------------- Pain Assessment Details Patient Name: [name] Date of Service: 07/25/2018 3:00 PM Medical Record Number: [id number] Patient Account Number: [id number] Date of Birth/Sex: 1930/07/09 (84 y.o. F) Treating RN: Phillis Haggis Primary Care Roger Kettles: Aram Beecham Other Clinician: Referring Isaiahs Chancy: Aram Beecham Treating Iyanna Drummer/Extender: Kathreen Cosier in Treatment: 57 Active Problems Location of Pain Severity and Description of Pain Patient Has Paino No Site Locations Pain Management and Medication Current Pain Management: Electronic Signature(s) Signed: 07/30/2018 5:34:19 PM By: Alejandro Mulling Entered By: Alejandro Mulling on 07/25/2018 15:05:54 [name] ([id number]) -------------------------------------------------------------------------------- Patient/Caregiver Education Details Patient Name: [name] Date of Service: 07/25/2018 3:00 PM Medical Record Number: [id number] Patient Account Number: [id number] Date of Birth/Gender: 01-Dec-1930 (84 y.o. F) Treating RN: Curtis Sites Primary Care Physician: Aram Beecham Other Clinician: Referring Physician: Aram Beecham Treating Physician/Extender: Kathreen Cosier in Treatment: 45 Education Assessment Education Provided To: Patient and Caregiver Education Topics Provided Wound/Skin Impairment: Handouts: Other: Wound care as ordered Methods: Demonstration, Explain/Verbal Responses: State content correctly Electronic Signature(s) Signed: 07/26/2018 4:33:06 PM By: Curtis Sites Entered By:  Curtis Sites on  07/25/2018 21:28:43 [name], [name] ([id number]) -------------------------------------------------------------------------------- Wound Assessment Details Patient Name: [name], [name] Date of Service: 07/25/2018 3:00 PM Medical Record Number: [id number] Patient Account Number: [id number] Date of Birth/Sex: 10-May-1930 (84 y.o. F) Treating RN: Phillis Haggis Primary Care Neeraj Housand: Aram Beecham Other Clinician: Referring Razi Hickle: Aram Beecham Treating Yen Wandell/Extender: Kathreen Cosier in Treatment: 36 Wound Status Wound Number: 9 Primary Trauma, Other Etiology: Wound Location: Left Elbow Wound Open Wounding Event: Trauma Status: Date Acquired: 01/26/2018 Comorbid Cataracts, Glaucoma, Optic Neuritis, Chronic Weeks Of Treatment: 12 History: sinus problems/congestion, Middle ear problems Clustered Wound: No Photos Photo Uploaded By: Alejandro Mulling on 07/25/2018 16:13:58 Wound Measurements Length: (cm) 0.7 Width: (cm) 0.4 Depth: (cm) 0.2 Area: (cm) 0.22 Volume: (cm) 0.044 % Reduction in Area: -209.9% % Reduction in Volume: -214.3% Epithelialization: None Tunneling: No Undermining: No Wound Description Full Thickness With Exposed Support Classification: Structures Wound Margin: Distinct, outline attached Exudate Large Amount: Exudate Type: Serous Exudate Color: amber Foul Odor After Cleansing: No Slough/Fibrino Yes Wound Bed Granulation Amount: Small (1-33%) Exposed Structure Granulation Quality: Pink Fascia Exposed: No Necrotic Amount: Large (67-100%) Fat Layer (Subcutaneous Tissue) Exposed: Yes Necrotic Quality: Eschar, Adherent Slough Tendon Exposed: No Muscle Exposed: No Joint Exposed: No Bone Exposed: Yes [name], [name] ([id number]) Periwound Skin Texture Texture Color No Abnormalities Noted: No No Abnormalities Noted: No Callus: Yes Atrophie Blanche: No Crepitus: No Cyanosis: No Excoriation: No Ecchymosis:  No Induration: No Erythema: Yes Rash: No Erythema Location: Circumferential Scarring: Yes Hemosiderin Staining: No Mottled: No Moisture Pallor: No No Abnormalities Noted: No Rubor: No Dry / Scaly: No Maceration: No Temperature / Pain Temperature: No Abnormality Tenderness on Palpation: Yes Wound Preparation Ulcer Cleansing: Rinsed/Irrigated with Saline Topical Anesthetic Applied: Other: lidocaine 4%, Electronic Signature(s) Signed: 07/30/2018 5:34:19 PM By: Alejandro Mulling Entered By: Alejandro Mulling on 07/25/2018 15:11:40 [name] ([id number]) -------------------------------------------------------------------------------- Vitals Details Patient Name: [name] Date of Service: 07/25/2018 3:00 PM Medical Record Number: [id number] Patient Account Number: [id number] Date of Birth/Sex: [birth year] (84 y.o. F) Treating RN: Phillis Haggis Primary Care Aidynn Krenn: Aram Beecham Other Clinician: Referring Annjeanette Sarwar: Aram Beecham Treating Elorah Dewing/Extender: Kathreen Cosier in Treatment: 36 Vital Signs Time Taken: 15:05 Temperature (F): 98.4 Height (in): 60 Pulse (bpm): 77 Weight (lbs): 123 Respiratory Rate (breaths/min): 18 Body Mass Index (BMI): 24 Blood Pressure (mmHg): 126/51 Reference Range: 80 - 120 mg / dl Electronic Signature(s) Signed: 07/30/2018 5:34:19 PM By: Alejandro Mulling Entered By: Alejandro Mulling on 07/25/2018 15:07:23

## 2018-08-10 NOTE — Progress Notes (Signed)
Natasha, Barnett ([id number]) Visit Report for 08/01/2018 Arrival Information Details Patient Name: [name], [name]. Date of Service: 08/01/2018 2:45 PM Medical Record Number: [id number] Patient Account Number: [id number] Date of Birth/Sex: 05-Apr-1930 (84 y.o. F) Treating RN: Phillis Haggis Primary Care Daveon Arpino: Aram Beecham Other Clinician: Referring Omauri Boeve: Aram Beecham Treating Khalin Royce/Extender: Altamese [place] in Treatment: 37 Visit Information History Since Last Visit All ordered tests and consults were completed: No Patient Arrived: Dan Humphreys Added or deleted any medications: No Arrival Time: 15:00 Any new allergies or adverse reactions: No Accompanied By: self Had a fall or experienced change in No Transfer Assistance: EasyPivot Patient activities of daily living that may affect Lift risk of falls: Patient Identification Verified: Yes Signs or symptoms of abuse/neglect since last visito No Secondary Verification Process Yes Hospitalized since last visit: No Completed: Implantable device outside of the clinic excluding No Patient Requires Transmission-Based No cellular tissue based products placed in the center Precautions: since last visit: Patient Has Alerts: Yes Has Dressing in Place as Prescribed: Yes Patient Alerts: non compressible left Pain Present Now: No leg Electronic Signature(s) Signed: 08/01/2018 4:43:18 PM By: Alejandro Mulling Entered By: Alejandro Mulling on 08/01/2018 15:01:59 [name] ([id number]) -------------------------------------------------------------------------------- Encounter Discharge Information Details Patient Name: [name] Date of Service: 08/01/2018 2:45 PM Medical Record Number: [id number] Patient Account Number: [id number] Date of Birth/Sex: [birth year] (84 y.o. F) Treating RN: Renne Crigler Primary Care Marche Hottenstein: Aram Beecham Other Clinician: Referring Ethelmae Ringel: Aram Beecham Treating  Salahuddin Arismendez/Extender: Altamese [place] in Treatment: 51 Encounter Discharge Information Items Discharge Condition: Stable Ambulatory Status: Walker Discharge Destination: Home Transportation: Private Auto Schedule Follow-up Appointment: Yes Clinical Summary of Care: Electronic Signature(s) Signed: 08/01/2018 4:37:51 PM By: Renne Crigler Entered By: Renne Crigler on 08/01/2018 15:56:02 [name] ([id number]) -------------------------------------------------------------------------------- Lower Extremity Assessment Details Patient Name: [name] Date of Service: 08/01/2018 2:45 PM Medical Record Number: [id number] Patient Account Number: [id number] Date of Birth/Sex: 02-Jan-1930 (84 y.o. F) Treating RN: Phillis Haggis Primary Care Delma Villalva: Aram Beecham Other Clinician: Referring Jemiah [place]: Aram Beecham Treating Muzammil Bruins/Extender: Altamese [place] in Treatment: 37 Electronic Signature(s) Signed: 08/01/2018 4:43:18 PM By: Alejandro Mulling Entered By: Alejandro Mulling on 08/01/2018 15:06:02 [name] ([id number]) -------------------------------------------------------------------------------- Multi Wound Chart Details Patient Name: [name] Date of Service: 08/01/2018 2:45 PM Medical Record Number: [id number] Patient Account Number: [id number] Date of Birth/Sex: 1930/05/31 (84 y.o. F) Treating RN: Curtis Sites Primary Care Shareece Bultman: Aram Beecham Other Clinician: Referring Orlandus Borowski: Aram Beecham Treating Aniayah Alaniz/Extender: Altamese [place] in Treatment: 37 Vital Signs Height(in): 60 Pulse(bpm): 67 Weight(lbs): 123 Blood Pressure(mmHg): 127/51 Body Mass Index(BMI): 24 Temperature(F): 97.8 Respiratory Rate 18 (breaths/min): Photos: [9:No Photos] [N/A:N/A] Wound Location: [9:Left Elbow] [N/A:N/A] Wounding Event: [9:Trauma] [N/A:N/A] Primary Etiology: [9:Trauma, Other] [N/A:N/A] Comorbid History: [9:Cataracts,  Glaucoma, Optic Neuritis, Chronic sinus problems/congestion, Middle ear problems] [N/A:N/A] Date Acquired: [9:01/26/2018] [N/A:N/A] Weeks of Treatment: [9:13] [N/A:N/A] Wound Status: [9:Open] [N/A:N/A] Measurements L x W x D [9:0.5x0.3x0.2] [N/A:N/A] (cm) Area (cm) : [9:0.118] [N/A:N/A] Volume (cm) : [9:0.024] [N/A:N/A] % Reduction in Area: [9:-66.20%] [N/A:N/A] % Reduction in Volume: [9:-71.40%] [N/A:N/A] Starting Position 1 [9:6] (o'clock): Ending Position 1 [9:12] (o'clock): Maximum Distance 1 (cm): [9:1] Undermining: [9:Yes] [N/A:N/A] Classification: [9:Full Thickness With Exposed Support Structures] [N/A:N/A] Exudate Amount: [9:Large] [N/A:N/A] Exudate Type: [9:Serous] [N/A:N/A] Exudate Color: [9:amber] [N/A:N/A] Wound Margin: [9:Distinct, outline attached] [N/A:N/A] Granulation Amount: [9:Small (1-33%)] [N/A:N/A] Granulation Quality: [9:Pink] [N/A:N/A] Necrotic Amount: [9:Large (67-100%)] [N/A:N/A] Necrotic Tissue: [9:Eschar, Adherent Slough] [N/A:N/A] Exposed  Structures: [9:Fat Layer (Subcutaneous Tissue) Exposed: Yes Bone: Yes Fascia: No] [N/A:N/A] Tendon: No Muscle: No Joint: No Epithelialization: None N/A N/A Debridement: Debridement - Excisional N/A N/A Pre-procedure 15:40 N/A N/A Verification/Time Out Taken: Pain Control: Lidocaine 4% Topical Solution N/A N/A Tissue Debrided: Callus, Subcutaneous, Slough N/A N/A Level: Skin/Subcutaneous Tissue N/A N/A Debridement Area (sq cm): 0.15 N/A N/A Instrument: Curette N/A N/A Bleeding: Minimum N/A N/A Hemostasis Achieved: Pressure N/A N/A Procedural Pain: 0 N/A N/A Post Procedural Pain: 0 N/A N/A Debridement Treatment Procedure was tolerated well N/A N/A Response: Post Debridement 0.5x0.3x0.2 N/A N/A Measurements L x W x D (cm) Post Debridement Volume: 0.024 N/A N/A (cm) Periwound Skin Texture: Callus: Yes N/A N/A Scarring: Yes Excoriation: No Induration: No Crepitus: No Rash: No Periwound Skin  Moisture: Maceration: No N/A N/A Dry/Scaly: No Periwound Skin Color: Erythema: Yes N/A N/A Atrophie Blanche: No Cyanosis: No Ecchymosis: No Hemosiderin Staining: No Mottled: No Pallor: No Rubor: No Erythema Location: Circumferential N/A N/A Temperature: No Abnormality N/A N/A Tenderness on Palpation: Yes N/A N/A Wound Preparation: Ulcer Cleansing: N/A N/A Rinsed/Irrigated with Saline Topical Anesthetic Applied: Other: lidocaine 4% Procedures Performed: Debridement N/A N/A Treatment Notes Wound #9 (Left Elbow) 1. Cleansed with: Clean wound with Normal Saline 2. Anesthetic Topical Lidocaine 4% cream to wound bed prior to debridement 4. Dressing Applied: DAMITA, EPPARD ([id number]) 5. Secondary Dressing Applied Bordered Foam Dressing Electronic Signature(s) Signed: 08/01/2018 5:35:06 PM By: Baltazar Najjar MD Entered By: Baltazar Najjar on 08/01/2018 16:11:02 [name] ([id number]) -------------------------------------------------------------------------------- Multi-Disciplinary Care Plan Details Patient Name: LOTTIE, SIGMAN Date of Service: 08/01/2018 2:45 PM Medical Record Number: [id number] Patient Account Number: [id number] Date of Birth/Sex: 07/31/1930 (84 y.o. F) Treating RN: Curtis Sites Primary Care Nyanna Heideman: Aram Beecham Other Clinician: Referring Trinda Harlacher: Aram Beecham Treating Kori Goins/Extender: Altamese [place] in Treatment: 9 Active Inactive ` Soft Tissue Infection Nursing Diagnoses: Impaired tissue integrity Goals: Patient's soft tissue infection will resolve Date Initiated: 07/18/2018 Target Resolution Date: 07/27/2018 Goal Status: Active Interventions: Assess signs and symptoms of infection every visit Treatment Activities: Culture and sensitivity : 07/18/2018 Systemic antibiotics : 07/18/2018 Notes: ` Wound/Skin Impairment Nursing Diagnoses: Impaired tissue integrity Knowledge deficit related to  ulceration/compromised skin integrity Goals: Patient/caregiver will verbalize understanding of skin care regimen Date Initiated: 11/14/2017 Target Resolution Date: 11/28/2017 Goal Status: Active Ulcer/skin breakdown will have a volume reduction of 30% by week 4 Date Initiated: 11/14/2017 Target Resolution Date: 11/28/2017 Goal Status: Active Interventions: Assess patient/caregiver ability to obtain necessary supplies Assess patient/caregiver ability to perform ulcer/skin care regimen upon admission and as needed Assess ulceration(s) every visit Treatment Activities: Skin care regimen initiated : 11/14/2017 JALYNNE, PERSICO ([id number]) Notes: Electronic Signature(s) Signed: 08/01/2018 5:18:29 PM By: Curtis Sites Entered By: Curtis Sites on 08/01/2018 15:39:15 [name] ([id number]) -------------------------------------------------------------------------------- Pain Assessment Details Patient Name: [name] Date of Service: 08/01/2018 2:45 PM Medical Record Number: [id number] Patient Account Number: [id number] Date of Birth/Sex: 07/20/1930 (84 y.o. F) Treating RN: Phillis Haggis Primary Care Izzah Pasqua: Aram Beecham Other Clinician: Referring Ocean Schildt: Aram Beecham Treating Alanna Storti/Extender: Altamese [place] in Treatment: 37 Active Problems Location of Pain Severity and Description of Pain Patient Has Paino No Site Locations Pain Management and Medication Current Pain Management: Electronic Signature(s) Signed: 08/01/2018 4:43:18 PM By: Alejandro Mulling Entered By: Alejandro Mulling on 08/01/2018 15:02:04 [name] ([id number]) -------------------------------------------------------------------------------- Patient/Caregiver Education Details Patient Name: [name] Date of Service: 08/01/2018 2:45 PM Medical Record Number: [id number] Patient Account Number:  [id number] Date of Birth/Gender: 1930-07-28 (84 y.o. F) Treating RN: Renne Crigler Primary Care Physician: Aram Beecham Other Clinician: Referring Physician: Aram Beecham Treating Physician/Extender: Altamese [place] in Treatment: 77 Education Assessment Education Provided To: Patient Education Topics Provided Wound/Skin Impairment: Handouts: Caring for Your Ulcer Methods: Explain/Verbal Responses: State content correctly Electronic Signature(s) Signed: 08/01/2018 4:37:51 PM By: Renne Crigler Entered By: Renne Crigler on 08/01/2018 15:56:15 [name] ([id number]) -------------------------------------------------------------------------------- Wound Assessment Details Patient Name: [name] Date of Service: 08/01/2018 2:45 PM Medical Record Number: [id number] Patient Account Number: [id number] Date of Birth/Sex: [birth year] (84 y.o. F) Treating RN: Phillis Haggis Primary Care Tristan Proto: Aram Beecham Other Clinician: Referring Michele Judy: Aram Beecham Treating Rannie Craney/Extender: Altamese [place] in Treatment: 37 Wound Status Wound Number: 9 Primary Trauma, Other Etiology: Wound Location: Left Elbow Wound Open Wounding Event: Trauma Status: Date Acquired: 01/26/2018 Comorbid Cataracts, Glaucoma, Optic Neuritis, Chronic Weeks Of Treatment: 13 History: sinus problems/congestion, Middle ear problems Clustered Wound: No Photos Photo Uploaded By: Alejandro Mulling on 08/01/2018 16:26:03 Wound Measurements Length: (cm) 0.5 Width: (cm) 0.3 Depth: (cm) 0.2 Area: (cm) 0.118 Volume: (cm) 0.024 % Reduction in Area: -66.2% % Reduction in Volume: -71.4% Epithelialization: None Tunneling: No Undermining: Yes Starting Position (o'clock): 6 Ending Position (o'clock): 12 Maximum Distance: (cm) 1 Wound Description Full Thickness With Exposed Support Classification: Structures Wound Margin: Distinct, outline attached Exudate Large Amount: Exudate Type: Serous Exudate Color: amber Foul Odor After Cleansing:  No Slough/Fibrino Yes Wound Bed Granulation Amount: Small (1-33%) Exposed Structure Granulation Quality: Pink Fascia Exposed: No Necrotic Amount: Large (67-100%) Fat Layer (Subcutaneous Tissue) Exposed: Yes CHARLESTINE, ROOKSTOOL ([id number]) Necrotic Quality: Eschar, Adherent Slough Tendon Exposed: No Muscle Exposed: No Joint Exposed: No Bone Exposed: Yes Periwound Skin Texture Texture Color No Abnormalities Noted: No No Abnormalities Noted: No Callus: Yes Atrophie Blanche: No Crepitus: No Cyanosis: No Excoriation: No Ecchymosis: No Induration: No Erythema: Yes Rash: No Erythema Location: Circumferential Scarring: Yes Hemosiderin Staining: No Mottled: No Moisture Pallor: No No Abnormalities Noted: No Rubor: No Dry / Scaly: No Maceration: No Temperature / Pain Temperature: No Abnormality Tenderness on Palpation: Yes Wound Preparation Ulcer Cleansing: Rinsed/Irrigated with Saline Topical Anesthetic Applied: Other: lidocaine 4%, Treatment Notes Wound #9 (Left Elbow) 1. Cleansed with: Clean wound with Normal Saline 2. Anesthetic Topical Lidocaine 4% cream to wound bed prior to debridement 4. Dressing Applied: Iodoflex 5. Secondary Dressing Applied Bordered Foam Dressing Electronic Signature(s) Signed: 08/01/2018 4:43:18 PM By: Alejandro Mulling Entered By: Alejandro Mulling on 08/01/2018 15:09:52 [name] ([id number]) -------------------------------------------------------------------------------- Vitals Details Patient Name: [name] Date of Service: 08/01/2018 2:45 PM Medical Record Number: [id number] Patient Account Number: [id number] Date of Birth/Sex: Aug 22, 1930 (84 y.o. F) Treating RN: Phillis Haggis Primary Care Janilah Hojnacki: Aram Beecham Other Clinician: Referring Mayukha Symmonds: Aram Beecham Treating Shenise Wolgamott/Extender: Altamese [place] in Treatment: 37 Vital Signs Time Taken: 15:02 Temperature (F): 97.8 Height (in): 60 Pulse (bpm):  67 Weight (lbs): 123 Respiratory Rate (breaths/min): 18 Body Mass Index (BMI): 24 Blood Pressure (mmHg): 127/51 Reference Range: 80 - 120 mg / dl Electronic Signature(s) Signed: 08/01/2018 4:43:18 PM By: Alejandro Mulling Entered By: Alejandro Mulling on 08/01/2018 15:03:51

## 2018-08-11 NOTE — Progress Notes (Signed)
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 Unfortunately her elbow which we are also managing at this point in time has not made as much progress unfortunately. She has been tolerating the dressing changes without complication. She did see Dr. Darleen Crocker earlier today and he states that he's willing to give this three weeks to see if she's making any progress with wound care. However he states that she's really not then he will need to go back in and perform further surgery. Obviously she is trying to avoid surgery if at all possible although I'm not sure if this is going to be possible or least not that quickly. 05/11/18 on evaluation today patient appears to be doing very well in regard to her left lower extremity ulcers. Unfortunately in regard to her elbow this is very slow coming about as far as any improvement is concerned. I do feel like there may be a little bit more granulation noted in the base of the wound but nothing too significant unfortunately. I still can probe bone in the proximal portion of the wound which obviously explain to the patient is not good. She will be having a follow-up  with her orthopedic surgeon in the next couple of weeks. In the meantime we are trying to do as much as we can to try to show signs of improvement in healing to avoid the need for any additional and further surgery. Nonetheless I explained to the patient yet again today I'm not sure if that is going to be feasible or not obviously it's more risk for her to continue to have an open wound with bone exposure then to the back in for additional surgery even though I know she doesn't want to go that route. 05/15/18 on evaluation today patient  presents for follow-up concerning her ongoing lower extremity ulcers on the left as well as the left elbow ulcer. She has at this point in time been tolerating the dressing changes without complication. Her left lower extremity ulcer appears to be doing very well. In regard to the left elbow ulcer she actually does seem to have additional granulation today which is good news. I am definitely seeing signs of improvement although obviously this is somewhat slow improvement. Nonetheless I'm hopeful we will be able to avoid her having to have any further surgery but again that would definitely be a conversation between herself as well as her surgeon once he sees her for reevaluation. Otherwise she does want to see about having a three order compression stockings for her today 05/21/18 on evaluation today patient appears to be doing well in regard to her left lower surety ulcer. This is almost completely healed and seems to be progressing very nicely. With that being said her left elbow is another story. I'm not really convinced in the past three weeks we've seen a significant improvement in this wound. With that being said if this is something that there is no surgical option for him we have to continue to work on this from the standpoint of conservative management with wound care she may make improvement given time. Nonetheless it appears that her surgeon is somewhat  concerned about the possibility of infection and really is leaning towards additional surgery to try and help close this wound. Nonetheless the patient is still unsure of exactly what to do. 05/29/18 on evaluation today patient appears to be doing well in regard to her left lower extremity ulcer. She's been tolerating the dressing changes without complication which is good news. With that being said she's been having issues specifically with her elbow she did see her surgeon Dr. Joice Lofts and he is recommending a repeat surgery to the left elbow in order to correct the issue. The patient is still somewhat unsure of this but feels like this may be better than trying to take time to let this heal over a longer period of time through normal wound care measures. Again I explained that I agree this may be a faster way to go if her surgeon feels that this is indeed a good direction to take. Obviously only he can make the judgment on whether or not the surgery would likely be successful. 06/04/18 on evaluation today patient actually presents for follow-up concerning her left elbow and left lower from the ulcer she seems to be doing very well at this point in time. She has been tolerating the dressing changes without complication. With that being said her elbow is not significantly better she actually is scheduled for surgery tomorrow. 07/04/18; the patient had an area on her left leg that is remaining closed. The open area she has now is a postsurgical wound on the left elbow. I think we have clearance from the surgeon to see this now. [address] [place] [facility] ([id number]) 07/11/18; we're currently dealing with a surgical wound on the left olecranon process. The patient complains of a lot of pain and drainage. When I saw her last week we did an x-ray that showed soft tissue wound and probable elbow joint effusion but no erosion to suggest osteomyelitis. The culture I did of this was somewhat  surprisingly negative. She has a small open wound with not a viable surface there is considerable undermining relative to the wound size. She is on methotrexate  for rheumatoid arthritis/overlap syndrome also plaquenil. We've been using silver collagen 07/18/18-She is seen in follow-up evaluation for a left elbow wound. There is essentially no change. She is currently on Zithromax and will complete that on Friday, there is no indication to extend this. We will change to iodosorb/iodoflex and monitor for response 07/25/18-She is seen in follow-up evaluation for left elbow wound. The wound is stable with no overt evidence of infection. She has counseled with her rheumatologist. She is wanting to restart her methotrexate; a culture was obtained to rule out occult infection before starting her methotrexate. We will continue with Iodosorb/Iodoflex and she will follow-up next week. 08/01/18; this is a difficult wound over her left olecranon process. There is been concerned about infection although cultures including one done last week were negative. Pending 3 weeks ago I gave her an empiric course of antibiotics. She is having a lot of rheumatologic pain in her hands with pain and stiffness. She wants to go on her weekly methotrexate and I think it would be reasonable to do so. We have been using Iodoflex Objective Constitutional Vitals Time Taken: 3:02 PM, Height: 60 in, Weight: 123 lbs, BMI: 24, Temperature: 97.8 F, Pulse: 67 bpm, Respiratory Rate: 18 breaths/min, Blood Pressure: 127/51 mmHg. Integumentary (Hair, Skin) Wound #9 status is Open. Original cause of wound was Trauma. The wound is located on the Left Elbow. The wound measures 0.5cm length x 0.3cm width x 0.2cm depth; 0.118cm^2 area and 0.024cm^3 volume. There is bone and Fat Layer (Subcutaneous Tissue) Exposed exposed. There is no tunneling noted, however, there is undermining starting at 6:00 and ending at 12:00 with a maximum distance of  1cm. There is a large amount of serous drainage noted. The wound margin is distinct with the outline attached to the wound base. There is small (1-33%) pink granulation within the wound bed. There is a large (67-100%) amount of necrotic tissue within the wound bed including Eschar and Adherent Slough. The periwound skin appearance exhibited: Callus, Scarring, Erythema. The periwound skin appearance did not exhibit: Crepitus, Excoriation, Induration, Rash, Dry/Scaly, Maceration, Atrophie Blanche, Cyanosis, Ecchymosis, Hemosiderin Staining, Mottled, Pallor, Rubor. The surrounding wound skin color is noted with erythema which is circumferential. Periwound temperature was noted as No Abnormality. The periwound has tenderness on palpation. 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 There was a Excisional Skin/Subcutaneous Tissue Debridement with a total area of 0.15 sq cm performed by Maxwell Caul, MD. With the following instrument(s): Curette to remove Viable and Non-Viable tissue/material. Material removed includes Callus, Subcutaneous Tissue, and Slough after achieving pain control using Lidocaine 4% Topical Solution. No specimens were taken. A time out was conducted at 15:40, prior to the start of the procedure. A Minimum amount of bleeding was controlled with Pressure. The procedure was tolerated well with a pain level of 0 throughout and a pain level of 0 following the procedure. Patient s Level of Consciousness post procedure was recorded as Awake and Alert. Post Debridement Measurements: 0.5cm length x 0.3cm width x 0.2cm depth; 0.024cm^3 volume. Character of Wound/Ulcer Post Debridement is improved. Post procedure  Diagnosis Wound #9: Same as Pre-Procedure Plan Wound Cleansing: Wound #9 Left Elbow: Clean wound with Normal Saline. Anesthetic (add to Medication List): Wound #9 Left Elbow:  Topical Lidocaine 4% cream applied to wound bed prior to debridement (In Clinic Only). Primary Wound Dressing: Wound #9 Left Elbow: Iodoflex Secondary Dressing: Wound #9 Left Elbow: Boardered Foam Dressing Dressing Change Frequency: Wound #9 Left Elbow: Change Dressing Monday, Wednesday, Friday - clean out left over dressing with saline and q-tip before applying new dressing. Follow-up Appointments: Wound #9 Left Elbow: Return Appointment in 1 week. Off-Loading: Wound #9 Left Elbow: Other: - Keep pressure off of elbow. #1continue with Iodoflex for now #2 [employer] or possibly single layer [employer] through her insurance #3 the patient has very significant rheumatologic type pain in her hands. She apparently has overlap syndrome. Given the amount of discomfort pain and stiffness she describes I would support going back on the methotrexate Electronic Signature(s) Signed: 08/01/2018 5:35:06 PM By: Baltazar Najjar MD [name] ([id number]) Entered By: Baltazar Najjar on 08/01/2018 16:15:22 [name] ([id number]) -------------------------------------------------------------------------------- SuperBill Details Patient Name: [name] Date of Service: 08/01/2018 Medical Record Number: [id number] Patient Account Number: [id number] Date of Birth/Sex: 12-26-1929 (84 y.o. 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

## 2018-08-15 ENCOUNTER — Encounter: Payer: Medicare Other | Attending: Internal Medicine | Admitting: Internal Medicine

## 2018-08-15 DIAGNOSIS — M351 Other overlap syndromes: Secondary | ICD-10-CM | POA: Insufficient documentation

## 2018-08-15 DIAGNOSIS — L98495 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: Secondary | ICD-10-CM | POA: Insufficient documentation

## 2018-08-15 DIAGNOSIS — Z950 Presence of cardiac pacemaker: Secondary | ICD-10-CM | POA: Diagnosis not present

## 2018-08-15 DIAGNOSIS — M419 Scoliosis, unspecified: Secondary | ICD-10-CM | POA: Insufficient documentation

## 2018-08-15 DIAGNOSIS — M069 Rheumatoid arthritis, unspecified: Secondary | ICD-10-CM | POA: Diagnosis not present

## 2018-08-15 DIAGNOSIS — L97905 Non-pressure chronic ulcer of unspecified part of unspecified lower leg with muscle involvement without evidence of necrosis: Secondary | ICD-10-CM | POA: Diagnosis not present

## 2018-08-16 NOTE — Progress Notes (Signed)
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 note that I reviewed. She has been placed on Bactrim DS by need for her leg wounds this probably helped a little bit with the left elbow surgery site. Obviously I do think this is something we can try to help her out with. 05/04/18 on evaluation today patient appears to be doing well in regard to her left anterior lower Trinity ulcers. She is making good progress which is great news. Unfortunately her elbow which we are also managing at this point in time has not made as much progress unfortunately. She has been tolerating the dressing changes without complication. She did see Dr. Darleen Crocker earlier today and he states that he's willing to give this three weeks to see if she's making any progress with wound care. However he states that she's really not then he will need to go back in and perform further surgery. Obviously she is trying to avoid surgery if at all possible although I'm not sure if this is going to be possible or least not that quickly. 05/11/18 on evaluation today patient appears to be doing very well in regard to her left lower extremity ulcers. Unfortunately in regard to her elbow  this is very slow coming about as far as any improvement is concerned. I do feel like there may be a little bit more granulation noted in the base of the wound but nothing too significant unfortunately. I still can probe bone in the proximal portion of the wound which obviously explain to the patient is not good. She will be having a follow-up with her orthopedic surgeon in the next couple of weeks. In the meantime we are trying to do as much as we can to try to show signs of improvement in healing to avoid the need for any  additional and further surgery. Nonetheless I explained to the patient yet again today I'm not sure if that is going to be feasible or not obviously it's more risk for her to continue to have an open wound with bone exposure then to the back in for additional surgery even though I know she doesn't want to go that route. 05/15/18 on evaluation today patient presents for follow-up concerning her ongoing lower extremity ulcers on the left as well as the left elbow ulcer. She has at this point in time been tolerating the dressing changes without complication. Her left lower extremity ulcer appears to be doing very well. In regard to the left elbow ulcer she actually does seem to have additional granulation today which is good news. I am definitely seeing signs of improvement although obviously this is somewhat slow improvement. Nonetheless I'm hopeful we will be able to avoid her having to have any further surgery but again that would definitely be a conversation between herself as well as her surgeon once he sees her for reevaluation. Otherwise she does want to see about having a three order compression stockings for her today 05/21/18 on evaluation today patient appears to be doing well in regard to her left lower surety ulcer. This is almost completely healed and seems to be progressing very nicely. With that being said her left elbow is another story. I'm not really convinced in the  past three weeks we've seen a significant improvement in this wound. With that being said if this is something that there is no surgical option for him we have to continue to work on this from the standpoint of conservative management with wound care she may make improvement given time. Nonetheless it appears that her surgeon is somewhat concerned about the possibility of infection and really is leaning towards additional surgery to try and help close this wound. Nonetheless the patient is still unsure of exactly what to do. 05/29/18 on evaluation today patient appears to be doing well in regard to her left lower extremity ulcer. She's been tolerating the dressing changes without complication which is good news. With that being said she's been having issues specifically with her elbow she did see her surgeon Dr. Joice Lofts and he is recommending a repeat surgery to the left elbow in order to correct the issue. The patient is still somewhat unsure of this but feels like this may be better than trying to take time to let this heal over a longer period of time through normal wound care measures. Again I explained that I agree this may be a faster way to go if her surgeon feels that this is indeed a good direction to take. Obviously only he can make the judgment on whether or not the surgery would likely be successful. 06/04/18 on evaluation today patient actually presents for follow-up concerning her left elbow and left lower from the ulcer she seems to be doing very well at this point in time. She has been tolerating the dressing changes without complication. With that being said her elbow is not significantly better she actually is scheduled for surgery tomorrow. 07/04/18; the patient had an area on her left leg that is remaining closed. The open area she has now is a postsurgical wound on the left elbow. I think we have clearance from the surgeon to see this now. [address] [place] VMarland Kitchen  ([id number]) 07/11/18; we're currently dealing with a surgical wound on the left olecranon process. The patient complains of a  lot of pain and drainage. When I saw her last week we did an x-ray that showed soft tissue wound and probable elbow joint effusion but no erosion to suggest osteomyelitis. The culture I did of this was somewhat surprisingly negative. She has a small open wound with not a viable surface there is considerable undermining relative to the wound size. She is on methotrexate for rheumatoid arthritis/overlap syndrome also plaquenil. We've been using silver collagen 07/18/18-She is seen in follow-up evaluation for a left elbow wound. There is essentially no change. She is currently on Zithromax and will complete that on Friday, there is no indication to extend this. We will change to iodosorb/iodoflex and monitor for response 07/25/18-She is seen in follow-up evaluation for left elbow wound. The wound is stable with no overt evidence of infection. She has counseled with her rheumatologist. She is wanting to restart her methotrexate; a culture was obtained to rule out occult infection before starting her methotrexate. We will continue with Iodosorb/Iodoflex and she will follow-up next week. 08/01/18; this is a difficult wound over her left olecranon process. There is been concerned about infection although cultures including one done last week were negative. Pending 3 weeks ago I gave her an empiric course of antibiotics. She is having a lot of rheumatologic pain in her hands with pain and stiffness. She wants to go on her weekly methotrexate and I think it would be reasonable to do so. We have been using Iodoflex 08/01/18; difficult wound over her left olecranon process. She started back on methotrexate last week because of rheumatologic pain in her hands. We have been using Iodoflex to try and clean out the wound bed. She has been approved for Graphix PL 08/15/18; 2 week follow-up.  Difficult wound over her left olecranon process. Graphix PL #1 with collagen backing Objective Constitutional Sitting or standing Blood Pressure is within target range for patient.. Pulse regular and within target range for patient.Marland Kitchen Respirations regular, non-labored and within target range.. Temperature is normal and within the target range for the patient.Marland Kitchen appears in no distress. Vitals Time Taken: 11:00 AM, Height: 60 in, Weight: 123 lbs, BMI: 24, Temperature: 97.8 F, Pulse: 86 bpm, Respiratory Rate: 18 breaths/min, Blood Pressure: 137/68 mmHg. General Notes: wound exam; small opening however still some undermining from 6-12 of at least a centimeter. Graphix PL carefully placed in the undermining area although I did not have a big enough piece Integumentary (Hair, Skin) Wound #9 status is Open. Original cause of wound was Trauma. The wound is located on the Left Elbow. The wound measures 0.5cm length x 0.3cm width x 0.3cm depth; 0.118cm^2 area and 0.035cm^3 volume. There is Fat Layer (Subcutaneous Tissue) Exposed exposed. There is no tunneling or undermining noted. There is a small amount of serous drainage noted. The wound margin is distinct with the outline attached to the wound base. There is small (1-33%) pink granulation within the wound bed. There is a large (67-100%) amount of necrotic tissue within the wound bed including Eschar and Adherent Slough. The periwound skin appearance exhibited: Callus, Scarring, Erythema. The periwound skin appearance did not exhibit: Crepitus, Excoriation, Induration, Rash, Dry/Scaly, Maceration, Atrophie Blanche, Cyanosis, Ecchymosis, Hemosiderin Staining, Mottled, Pallor, Rubor. The surrounding wound skin color is noted with erythema which is circumferential. Periwound temperature was noted as No Abnormality. The periwound has tenderness on palpation. 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 A skin graft procedure using a bioengineered skin substitute/cellular or tissue based product was performed by Maxwell Caul, MD with the following instrument(s): Forceps and Scissors. Other was applied and secured with Steri-Strips. 2 sq cm of product was utilized and 0 sq cm was wasted. Post Application, mepitel was applied. A Time Out was conducted at 11:44, prior to the start of the procedure. The procedure was tolerated well. Patient s Level of Consciousness post procedure was recorded as Awake and Alert. Post procedure Diagnosis Wound #9: Same as Pre-Procedure . Plan #1 we will need a larger size of the Graphix PL #2 collagen backing Electronic Signature(s) Signed: 08/15/2018 4:51:46 PM By: Baltazar Najjar MD Entered By: Baltazar Najjar on 08/15/2018 12:45:16 [name] ([id number]) -------------------------------------------------------------------------------- SuperBill Details Patient Name: [name] Date of Service: 08/15/2018 Medical Record Number: [id number] Patient Account Number: [id number] Date of Birth/Sex: [birth year] (84 y.o. F) Treating RN: Huel Coventry Primary Care Provider: Aram Beecham Other Clinician: Referring Provider: Aram Beecham Treating Provider/Extender: Altamese [place] in Treatment: 39 Diagnosis Coding ICD-10 Codes Code Description S51.002S Unspecified open wound of left elbow, sequela L98.495 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 Facility Procedures CPT4 Code: [id number] Description: 15271 - SKIN SUB GRAFT TRNK/ARM/LEG ICD-10 Diagnosis Description S51.002S Unspecified open wound of left elbow, sequela Modifier: Quantity: 1 CPT4 Code:  84696295 Description: Q4133 Grafix PL 16 mm Disk ICD-10 Diagnosis Description S51.002S Unspecified open wound of left elbow, sequela Modifier: Quantity: 2 Physician Procedures CPT4 Code: [id number] Description: 15271 - WC PHYS SKIN SUB GRAFT TRNK/ARM/LEG ICD-10 Diagnosis Description S51.002S Unspecified open wound of left elbow, sequela Modifier: Quantity: 1 Electronic Signature(s) Unsigned Previous Signature: 08/15/2018 4:51:46 PM Version By: Baltazar Najjar MD Entered By: Francie Massing on 08/16/2018 10:37:22 Signature(s): Date(s):

## 2018-08-22 ENCOUNTER — Encounter: Payer: Medicare Other | Admitting: Internal Medicine

## 2018-08-22 DIAGNOSIS — L98495 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: Secondary | ICD-10-CM | POA: Diagnosis not present

## 2018-08-24 NOTE — Progress Notes (Signed)
MICHAELLA, KREITER ([id number]) Visit Report for 08/22/2018 Arrival Information Details Patient Name: [name], [name] Date of Service: 08/22/2018 1:30 PM Medical Record Number: [id number] Patient Account Number: [id number] Date of Birth/Sex: 18-Sep-1930 (84 y.o. F) Treating RN: Curtis Sites Primary Care Darlisha Kelm: Aram Beecham Other Clinician: Referring Ebelin Dillehay: Aram Beecham Treating Domino Holten/Extender: Altamese [place] in Treatment: 40 Visit Information History Since Last Visit Added or deleted any medications: No Patient Arrived: Walker Any new allergies or adverse reactions: No Arrival Time: 13:32 Had a fall or experienced change in No Accompanied By: self activities of daily living that may affect Transfer Assistance: None risk of falls: Patient Identification Verified: Yes Signs or symptoms of abuse/neglect since last visito No Secondary Verification Process Yes Hospitalized since last visit: No Completed: Implantable device outside of the clinic excluding No Patient Requires Transmission-Based No cellular tissue based products placed in the center Precautions: since last visit: Patient Has Alerts: Yes Has Dressing in Place as Prescribed: Yes Patient Alerts: non compressible left Pain Present Now: No leg Electronic Signature(s) Signed: 08/22/2018 3:03:26 PM By: Dayton Martes RCP, RRT, CHT Entered By: Dayton Martes on 08/22/2018 13:33:05 [name] ([id number]) -------------------------------------------------------------------------------- Encounter Discharge Information Details Patient Name: [name] Date of Service: 08/22/2018 1:30 PM Medical Record Number: [id number] Patient Account Number: [id number] Date of Birth/Sex: Jun 06, 1930 (84 y.o. F) Treating RN: Curtis Sites Primary Care Eiman Maret: Aram Beecham Other Clinician: Referring Shan Padgett: Aram Beecham Treating Yacoub Diltz/Extender: Altamese [place]  in Treatment: 55 Encounter Discharge Information Items Discharge Condition: Stable Ambulatory Status: Walker Discharge Destination: Home Transportation: Private Auto Accompanied By: self Schedule Follow-up Appointment: Yes Clinical Summary of Care: Electronic Signature(s) Signed: 08/22/2018 5:20:22 PM By: Curtis Sites Entered By: Curtis Sites on 08/22/2018 14:10:37 [name] ([id number]) -------------------------------------------------------------------------------- Lower Extremity Assessment Details Patient Name: [name] Date of Service: 08/22/2018 1:30 PM Medical Record Number: [id number] Patient Account Number: [id number] Date of Birth/Sex: 11-12-1930 (84 y.o. F) Treating RN: Rema [name] Primary Care Freddi Schrager: Aram Beecham Other Clinician: Referring Brannon Decaire: Aram Beecham Treating Accalia Rigdon/Extender: Maxwell Caul [place] in Treatment: 40 Electronic Signature(s) Signed: 08/22/2018 1:57:12 PM By: Rema [name] Entered By: Rema [name] on 08/22/2018 13:43:07 [name] ([id number]) -------------------------------------------------------------------------------- Multi Wound Chart Details Patient Name: [name] Date of Service: 08/22/2018 1:30 PM Medical Record Number: [id number] Patient Account Number: [id number] Date of Birth/Sex: [birth year] (84 y.o. F) Treating RN: Curtis Sites Primary Care Taejah Ohalloran: Aram Beecham Other Clinician: Referring Lillyanna Glandon: Aram Beecham Treating Magaret Justo/Extender: Altamese  in Treatment: 40 Vital Signs Height(in): 60 Pulse(bpm): 66 Weight(lbs): 123 Blood Pressure(mmHg): 125/46 Body Mass Index(BMI): 24 Temperature(F): 98.2 Respiratory Rate 18 (breaths/min): Photos: [N/A:N/A] Wound Location: Left Elbow N/A N/A Wounding Event: Trauma N/A N/A Primary Etiology: Trauma, Other N/A N/A Comorbid History: Cataracts, Glaucoma, Optic N/A N/A Neuritis, Chronic sinus problems/congestion, Middle ear  problems Date Acquired: 01/26/2018 N/A N/A Weeks of Treatment: 16 N/A N/A Wound Status: Open N/A N/A Measurements L x W x D 0.5x0.5x0.3 N/A N/A (cm) Area (cm) : 0.196 N/A N/A Volume (cm) : 0.059 N/A N/A % Reduction in Area: -176.10% N/A N/A % Reduction in Volume: -321.40% N/A N/A Classification: Full Thickness With Exposed N/A N/A Support Structures Exudate Amount: Small N/A N/A Exudate Type: Serous N/A N/A Exudate Color: amber N/A N/A Wound Margin: Distinct, outline attached N/A N/A Granulation Amount: None Present (0%) N/A N/A Necrotic Amount: Large (67-100%) N/A N/A Necrotic Tissue: Eschar, Adherent Slough N/A N/A Exposed Structures: Fat Layer (Subcutaneous N/A  N/A Tissue) Exposed: Yes Fascia: No Tendon: No [name], [name] ([id number]) Muscle: No Joint: No Bone: No Epithelialization: None N/A N/A Periwound Skin Texture: Callus: Yes N/A N/A Scarring: Yes Excoriation: No Induration: No Crepitus: No Rash: No Periwound Skin Moisture: Maceration: Yes N/A N/A Dry/Scaly: No Periwound Skin Color: Erythema: Yes N/A N/A Atrophie Blanche: No Cyanosis: No Ecchymosis: No Hemosiderin Staining: No Mottled: No Pallor: No Rubor: No Erythema Location: Circumferential N/A N/A Temperature: No Abnormality N/A N/A Tenderness on Palpation: Yes N/A N/A Wound Preparation: Ulcer Cleansing: N/A N/A Rinsed/Irrigated with Saline Topical Anesthetic Applied: Other: lidocaine 4% Procedures Performed: Cellular or Tissue Based N/A N/A Product Treatment Notes Wound #9 (Left Elbow) 1. Cleansed with: Clean wound with Normal Saline 2. Anesthetic Topical Lidocaine 4% cream to wound bed prior to debridement 3. Peri-wound Care: Skin Prep 4. Dressing Applied: Mepitel Other dressing (specify in notes) 5. Secondary Dressing Applied Bordered Foam Dressing Notes grafix pl applied in clinic today, mepitel, steri strips, foam and bordered foam Electronic Signature(s) Signed: 08/22/2018  5:30:50 PM By: Baltazar Najjar MD Entered By: Baltazar Najjar on 08/22/2018 14:18:52 [name] ([id number]) -------------------------------------------------------------------------------- Multi-Disciplinary Care Plan Details Patient Name: [name] Date of Service: 08/22/2018 1:30 PM Medical Record Number: [id number] Patient Account Number: [id number] Date of Birth/Sex: [birth year] (84 y.o. F) Treating RN: Curtis Sites Primary Care Evonda Enge: Aram Beecham Other Clinician: Referring Abeer Deskins: Aram Beecham Treating Marianne Golightly/Extender: Altamese [place] in Treatment: 40 Active Inactive ` Soft Tissue Infection Nursing Diagnoses: Impaired tissue integrity Goals: Patient's soft tissue infection will resolve Date Initiated: 07/18/2018 Target Resolution Date: 07/27/2018 Goal Status: Active Interventions: Assess signs and symptoms of infection every visit Treatment Activities: Culture and sensitivity : 07/18/2018 Systemic antibiotics : 07/18/2018 Notes: ` Wound/Skin Impairment Nursing Diagnoses: Impaired tissue integrity Knowledge deficit related to ulceration/compromised skin integrity Goals: Patient/caregiver will verbalize understanding of skin care regimen Date Initiated: 11/14/2017 Target Resolution Date: 11/28/2017 Goal Status: Active Ulcer/skin breakdown will have a volume reduction of 30% by week 4 Date Initiated: 11/14/2017 Target Resolution Date: 11/28/2017 Goal Status: Active Interventions: Assess patient/caregiver ability to obtain necessary supplies Assess patient/caregiver ability to perform ulcer/skin care regimen upon admission and as needed Assess ulceration(s) every visit Treatment Activities: Skin care regimen initiated : 11/14/2017 [name], [name] ([id number]) Notes: Electronic Signature(s) Signed: 08/22/2018 5:20:22 PM By: Curtis Sites Entered By: Curtis Sites on 08/22/2018 13:59:35 [name]  ([id number]) -------------------------------------------------------------------------------- Pain Assessment Details Patient Name: [name] Date of Service: 08/22/2018 1:30 PM Medical Record Number: [id number] Patient Account Number: [id number] Date of Birth/Sex: 1930-04-01 (84 y.o. F) Treating RN: Curtis Sites Primary Care Oliviya Gilkison: Aram Beecham Other Clinician: Referring Nadezhda Pollitt: Aram Beecham Treating Oniyah Rohe/Extender: Altamese [place] in Treatment: 40 Active Problems Location of Pain Severity and Description of Pain Patient Has Paino No Site Locations Pain Management and Medication Current Pain Management: Electronic Signature(s) Signed: 08/22/2018 3:03:26 PM By: Sallee Provencal, RRT, CHT Signed: 08/22/2018 5:20:22 PM By: Curtis Sites Entered By: Dayton Martes on 08/22/2018 13:33:13 [name] ([id number]) -------------------------------------------------------------------------------- Patient/Caregiver Education Details Patient Name: [name] Date of Service: 08/22/2018 1:30 PM Medical Record Number: [id number] Patient Account Number: [id number] Date of Birth/Gender: 1929-12-16 (84 y.o. F) Treating RN: Curtis Sites Primary Care Physician: Aram Beecham Other Clinician: Referring Physician: Aram Beecham Treating Physician/Extender: Altamese [place] in Treatment: 84 Education Assessment Education Provided To: Patient Education Topics Provided Wound/Skin Impairment: Handouts: Other: changing outter bandage if needed Methods: Demonstration, Explain/Verbal Responses: State content  correctly Electronic Signature(s) Signed: 08/22/2018 5:20:22 PM By: Curtis Sites Entered By: Curtis Sites on 08/22/2018 14:11:04 [name] ([id number]) -------------------------------------------------------------------------------- Wound Assessment Details Patient Name: [name] Date of Service:  08/22/2018 1:30 PM Medical Record Number: [id number] Patient Account Number: [id number] Date of Birth/Sex: [birth year] (84 y.o. F) Treating RN: Rema [name] Primary Care Ladaja Yusupov: Aram Beecham Other Clinician: Referring Carlton Sweaney: Aram Beecham Treating Shreyan Hinz/Extender: Altamese [place] in Treatment: 40 Wound Status Wound Number: 9 Primary Trauma, Other Etiology: Wound Location: Left Elbow Wound Open Wounding Event: Trauma Status: Date Acquired: 01/26/2018 Comorbid Cataracts, Glaucoma, Optic Neuritis, Chronic Weeks Of Treatment: 16 History: sinus problems/congestion, Middle ear problems Clustered Wound: No Photos Wound Measurements Length: (cm) 0.5 Width: (cm) 0.5 Depth: (cm) 0.3 Area: (cm) 0.196 Volume: (cm) 0.059 % Reduction in Area: -176.1% % Reduction in Volume: -321.4% Epithelialization: None Tunneling: No Undermining: No Wound Description Full Thickness With Exposed Support Classification: Structures Wound Margin: Distinct, outline attached Exudate Small Amount: Exudate Type: Serous Exudate Color: amber Foul Odor After Cleansing: No Slough/Fibrino Yes Wound Bed Granulation Amount: None Present (0%) Exposed Structure Necrotic Amount: Large (67-100%) Fascia Exposed: No Necrotic Quality: Eschar, Adherent Slough Fat Layer (Subcutaneous Tissue) Exposed: Yes Tendon Exposed: No Muscle Exposed: No Joint Exposed: No Bone Exposed: No Periwound Skin Texture [name], [name] ([id number]) Texture Color No Abnormalities Noted: No No Abnormalities Noted: No Callus: Yes Atrophie Blanche: No Crepitus: No Cyanosis: No Excoriation: No Ecchymosis: No Induration: No Erythema: Yes Rash: No Erythema Location: Circumferential Scarring: Yes Hemosiderin Staining: No Mottled: No Moisture Pallor: No No Abnormalities Noted: No Rubor: No Dry / Scaly: No Maceration: Yes Temperature / Pain Temperature: No Abnormality Tenderness on Palpation: Yes Wound  Preparation Ulcer Cleansing: Rinsed/Irrigated with Saline Topical Anesthetic Applied: Other: lidocaine 4%, Treatment Notes Wound #9 (Left Elbow) 1. Cleansed with: Clean wound with Normal Saline 2. Anesthetic Topical Lidocaine 4% cream to wound bed prior to debridement 3. Peri-wound Care: Skin Prep 4. Dressing Applied: Mepitel Other dressing (specify in notes) 5. Secondary Dressing Applied Bordered Foam Dressing Notes grafix pl applied in clinic today, mepitel, steri strips, foam and bordered foam Electronic Signature(s) Signed: 08/22/2018 2:52:08 PM By: Rema [name] Previous Signature: 08/22/2018 1:57:12 PM Version By: Rema [name] Entered By: Rema [name] on 08/22/2018 14:00:59 [name] ([id number]) -------------------------------------------------------------------------------- Vitals Details Patient Name: [name] Date of Service: 08/22/2018 1:30 PM Medical Record Number: [id number] Patient Account Number: [id number] Date of Birth/Sex: 1930-09-27 (84 y.o. F) Treating RN: Curtis Sites Primary Care Blakelyn Dinges: Aram Beecham Other Clinician: Referring Inez Rosato: Aram Beecham Treating Fredda Clarida/Extender: Altamese [place] in Treatment: 40 Vital Signs Time Taken: 13:30 Temperature (F): 98.2 Height (in): 60 Pulse (bpm): 66 Weight (lbs): 123 Respiratory Rate (breaths/min): 18 Body Mass Index (BMI): 24 Blood Pressure (mmHg): 125/46 Reference Range: 80 - 120 mg / dl Electronic Signature(s) Signed: 08/22/2018 3:03:26 PM By: Dayton Martes RCP, RRT, CHT Entered By: Dayton Martes on 08/22/2018 13:35:13

## 2018-08-24 NOTE — Progress Notes (Signed)
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 wounds this probably helped a little bit with the left elbow surgery site. Obviously I do think this is something we can try to help her out with. 05/04/18 on evaluation today patient appears to be doing well in regard to her left anterior lower Trinity ulcers. She is making good progress which is great news. Unfortunately her elbow which we are also managing at this point in time has not made as much progress unfortunately. She has been tolerating the dressing changes without complication. She did see Dr. Darleen Crocker earlier today and he states that he's willing to give this three weeks to see if she's making any progress with wound care. However he states that she's really not then he will need to go back in and perform further surgery. Obviously she is trying to avoid surgery if at all possible although I'm not sure if this is going to be possible or least not that quickly. 05/11/18 on evaluation today patient appears to be doing very well in regard to her left lower extremity ulcers. Unfortunately in regard to her elbow this is very slow coming about as far as any improvement is concerned. I do feel  like there may be a little bit more granulation noted in the base of the wound but nothing too significant unfortunately. I still can probe bone in the proximal portion of the wound which obviously explain to the patient is not good. She will be having a follow-up with her orthopedic surgeon in the next couple of weeks. In the meantime we are trying to do as much as we can to try to show signs of improvement in healing to avoid the need for any additional and further surgery. Nonetheless I explained to the patient yet again today I'm not sure if that is going to be  feasible or not obviously it's more risk for her to continue to have an open wound with bone exposure then to the back in for additional surgery even though I know she doesn't want to go that route. 05/15/18 on evaluation today patient presents for follow-up concerning her ongoing lower extremity ulcers on the left as well as the left elbow ulcer. She has at this point in time been tolerating the dressing changes without complication. Her left lower extremity ulcer appears to be doing very well. In regard to the left elbow ulcer she actually does seem to have additional granulation today which is good news. I am definitely seeing signs of improvement although obviously this is somewhat slow improvement. Nonetheless I'm hopeful we will be able to avoid her having to have any further surgery but again that would definitely be a conversation between herself as well as her surgeon once he sees her for reevaluation. Otherwise she does want to see about having a three order compression stockings for her today 05/21/18 on evaluation today patient appears to be doing well in regard to her left lower surety ulcer. This is almost completely healed and seems to be progressing very nicely. With that being said her left elbow is another story. I'm not really convinced in the past three weeks we've seen a significant improvement in this wound. With that  being said if this is something that there is no surgical option for him we have to continue to work on this from the standpoint of conservative management with wound care she may make improvement given time. Nonetheless it appears that her surgeon is somewhat concerned about the possibility of infection and really is leaning towards additional surgery to try and help close this wound. Nonetheless the patient is still unsure of exactly what to do. 05/29/18 on evaluation today patient appears to be doing well in regard to her left lower extremity ulcer. She's been tolerating the dressing changes without complication which is good news. With that being said she's been having issues specifically with her elbow she did see her surgeon Dr. Joice Lofts and he is recommending a repeat surgery to the left elbow in order to correct the issue. The patient is still somewhat unsure of this but feels like this may be better than trying to take time to let this heal over a longer period of time through normal wound care measures. Again I explained that I agree this may be a faster way to go if her surgeon feels that this is indeed a good direction to take. Obviously only he can make the judgment on whether or not the surgery would likely be successful. 06/04/18 on evaluation today patient actually presents for follow-up concerning her left elbow and left lower from the ulcer she seems to be doing very well at this point in time. She has been tolerating the dressing changes without complication. With that being said her elbow is not significantly better she actually is scheduled for surgery tomorrow. 07/04/18; the patient had an area on her left leg that is remaining closed. The open area she has now is a postsurgical wound on the left elbow. I think we have clearance from the surgeon to see this now. [address] [place] VMarland Kitchen ([id number]) 07/11/18; we're currently dealing with a surgical wound on the left  olecranon process. The patient complains of a lot of pain and drainage. When I saw her last week we did an x-ray that showed soft tissue wound and probable  elbow joint effusion but no erosion to suggest osteomyelitis. The culture I did of this was somewhat surprisingly negative. She has a small open wound with not a viable surface there is considerable undermining relative to the wound size. She is on methotrexate for rheumatoid arthritis/overlap syndrome also plaquenil. We've been using silver collagen 07/18/18-She is seen in follow-up evaluation for a left elbow wound. There is essentially no change. She is currently on Zithromax and will complete that on Friday, there is no indication to extend this. We will change to iodosorb/iodoflex and monitor for response 07/25/18-She is seen in follow-up evaluation for left elbow wound. The wound is stable with no overt evidence of infection. She has counseled with her rheumatologist. She is wanting to restart her methotrexate; a culture was obtained to rule out occult infection before starting her methotrexate. We will continue with Iodosorb/Iodoflex and she will follow-up next week. 08/01/18; this is a difficult wound over her left olecranon process. There is been concerned about infection although cultures including one done last week were negative. Pending 3 weeks ago I gave her an empiric course of antibiotics. She is having a lot of rheumatologic pain in her hands with pain and stiffness. She wants to go on her weekly methotrexate and I think it would be reasonable to do so. We have been using Iodoflex 08/01/18; difficult wound over her left olecranon process. She started back on methotrexate last week because of rheumatologic pain in her hands. We have been using Iodoflex to try and clean out the wound bed. She has been approved for Graphix PL 08/15/18; 2 week follow-up. Difficult wound over her left olecranon process. Graphix PL #1 with collagen  backing 08/22/18; one-week follow-up. Difficult wound over her left olecranon process. Graphix PL #2 Objective Constitutional Sitting or standing Blood Pressure is within target range for patient.. Pulse regular and within target range for patient.Marland Kitchen Respirations regular, non-labored and within target range.. Temperature is normal and within the target range for the patient.Marland Kitchen appears in no distress. Vitals Time Taken: 1:30 PM, Height: 60 in, Weight: 123 lbs, BMI: 24, Temperature: 98.2 F, Pulse: 66 bpm, Respiratory Rate: 18 breaths/min, Blood Pressure: 125/46 mmHg. General Notes: wound exam; small opening. The undermining from 8-12 seems less impressive to me than last week. Wound bed healthy. Graphix PL carefully applied Integumentary (Hair, Skin) Wound #9 status is Open. Original cause of wound was Trauma. The wound is located on the Left Elbow. The wound measures 0.5cm length x 0.5cm width x 0.3cm depth; 0.196cm^2 area and 0.059cm^3 volume. There is Fat Layer (Subcutaneous Tissue) Exposed exposed. There is no tunneling or undermining noted. There is a small amount of serous drainage noted. The wound margin is distinct with the outline attached to the wound base. There is no granulation within the wound bed. There is a large (67-100%) amount of necrotic tissue within the wound bed including Eschar and Adherent Slough. The periwound skin appearance exhibited: Callus, Scarring, Maceration, Erythema. The periwound skin appearance did not exhibit: Crepitus, Excoriation, Induration, Rash, Dry/Scaly, Atrophie Blanche, Cyanosis, Ecchymosis, Hemosiderin Staining, Mottled, Pallor, Rubor. The surrounding wound skin color is noted with erythema which is circumferential. Periwound temperature was noted as No Abnormality. The periwound has tenderness on palpation. 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 A skin graft procedure using a bioengineered skin substitute/cellular or tissue based product was performed by Maxwell Caul, MD with the following instrument(s): Forceps and Scissors. Other was applied and secured with Steri-Strips. 3 sq cm of product was utilized and 0 sq cm was wasted. Post Application, mepitel one was applied. A Time Out was conducted at 14:03, prior to the start of the procedure. The procedure was tolerated well with a pain level of 0 throughout and a pain level of 0 following the procedure. Patient s Level of Consciousness post procedure was recorded as Awake and Alert. Post procedure Diagnosis Wound #9: Same as Pre-Procedure . Plan Wound Cleansing: Wound #9 Left Elbow: Clean wound with Normal Saline. Anesthetic (add to Medication List): Wound #9 Left Elbow: Topical Lidocaine 4% cream applied to wound bed prior to debridement (In Clinic Only). Skin Barriers/Peri-Wound Care: Wound #9 Left Elbow: Skin Prep Primary Wound Dressing: Other: - Grafix PL application in clinic; including contact layer, fixation with steri strips, dry gauze and cover dressing. Dressing Change Frequency: Wound #9 Left Elbow: Change dressing every week - may change outer dressing if it gets soiled or wet Follow-up Appointments: Wound #9 Left Elbow: Return Appointment in 1 week. #1 graphix PL #2 applied in the standard fashion #2 I think there is less undermining. I am really hoping initially to adhere the overlying skin to the wound bed. Electronic Signature(s) [name], [name] ([id number]) Signed: 08/22/2018 5:30:50 PM By: Baltazar Najjar MD Entered By: Baltazar Najjar on 08/22/2018 14:25:15 [name] ([id number]) -------------------------------------------------------------------------------- SuperBill  Details Patient Name: [name] Date of Service: 08/22/2018 Medical Record Number: [id number] Patient Account Number: [id number] Date of Birth/Sex: 06-Nov-1930 (84 y.o. F) Treating RN: Curtis Sites Primary Care Provider: Aram Beecham Other Clinician: Referring Provider: Aram Beecham Treating Provider/Extender: Altamese Ponshewaing in Treatment: 40 Diagnosis Coding ICD-10 Codes Code Description S51.002S Unspecified open wound of left elbow, sequela L98.495 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 Facility Procedures CPT4 Code: [id number] Description: 15271 - SKIN SUB GRAFT TRNK/ARM/LEG ICD-10 Diagnosis Description S51.002S Unspecified open wound of left elbow, sequela Modifier: Quantity: 1 Physician Procedures CPT4 Code: [id number] Description: 15271 - WC PHYS SKIN SUB GRAFT TRNK/ARM/LEG ICD-10 Diagnosis Description S51.002S Unspecified open wound of left elbow, sequela Modifier: Quantity: 1 Electronic Signature(s) Signed: 08/22/2018 5:30:50 PM By: Baltazar Najjar MD Entered By: Baltazar Najjar on 08/22/2018 14:25:31

## 2018-08-24 NOTE — Progress Notes (Signed)
OSIRIS, RENTERIA ([id number]) Visit Report for 08/15/2018 Arrival Information Details Patient Name: Natasha, Barnett. Date of Service: 08/15/2018 10:45 AM Medical Record Number: [id number] Patient Account Number: [id number] Date of Birth/Sex: [birth year] (84 y.o. F) Treating RN: Rema [name] Primary Care Duane Earnshaw: Aram Beecham Other Clinician: Referring Chea Malan: Aram Beecham Treating Zelma Mazariego/Extender: Altamese [place] in Treatment: 42 Visit Information History Since Last Visit Added or deleted any medications: No Patient Arrived: Walker Any new allergies or adverse reactions: No Arrival Time: 10:54 Had a fall or experienced change in No Accompanied By: self activities of daily living that may affect Transfer Assistance: None risk of falls: Patient Identification Verified: Yes Signs or symptoms of abuse/neglect since last visito No Secondary Verification Process Yes Hospitalized since last visit: No Completed: Implantable device outside of the clinic excluding No Patient Requires Transmission-Based No cellular tissue based products placed in the center Precautions: since last visit: Patient Has Alerts: Yes Has Dressing in Place as Prescribed: Yes Patient Alerts: non compressible left Pain Present Now: No leg Electronic Signature(s) Signed: 08/15/2018 2:39:50 PM By: Rema [name] Entered By: Rema [name] on 08/15/2018 11:01:22 [name] ([id number]) -------------------------------------------------------------------------------- Complex / Palliative Patient Assessment Details Patient Name: [name] Date of Service: 08/15/2018 10:45 AM Medical Record Number: [id number] Patient Account Number: [id number] Date of Birth/Sex: 1930/11/25 (84 y.o. F) Treating RN: Huel Coventry Primary Care Ramiyah Mcclenahan: Aram Beecham Other Clinician: Referring Sephira Zellman: Aram Beecham Treating Ashwini Jago/Extender: Altamese [place] in Treatment: 39 Palliative Management  Criteria Complex Wound Management Criteria Patient has remarkable or complex co-morbidities requiring medications or treatments that extend wound healing times. Examples: o Diabetes mellitus with chronic renal failure or end stage renal disease requiring dialysis o Advanced or poorly controlled rheumatoid arthritis o Diabetes mellitus and end stage chronic obstructive pulmonary disease o Active cancer with current chemo- or radiation therapy RA Care Approach Wound Care Plan: Complex Wound Management Electronic Signature(s) Signed: 08/16/2018 11:29:42 AM By: Elliot Gurney, BSN, RN, CWS, Kim RN, BSN Signed: 08/22/2018 5:30:50 PM By: Baltazar Najjar MD Entered By: Elliot Gurney, BSN, RN, CWS, Kim on 08/16/2018 11:29:42 [name] ([id number]) -------------------------------------------------------------------------------- Encounter Discharge Information Details Patient Name: Natasha, Barnett. Date of Service: 08/15/2018 10:45 AM Medical Record Number: [id number] Patient Account Number: [id number] Date of Birth/Sex: Mar 12, 1930 (84 y.o. F) Treating RN: Curtis Sites Primary Care Jaiyanna Safran: Aram Beecham Other Clinician: Referring Kinzley Savell: Aram Beecham Treating Urias Sheek/Extender: Altamese [place] in Treatment: 30 Encounter Discharge Information Items Discharge Condition: Stable Ambulatory Status: Walker Discharge Destination: Home Transportation: Private Auto Accompanied By: self Schedule Follow-up Appointment: Yes Clinical Summary of Care: Electronic Signature(s) Signed: 08/15/2018 2:59:01 PM By: Curtis Sites Entered By: Curtis Sites on 08/15/2018 14:59:01 [name] ([id number]) -------------------------------------------------------------------------------- Lower Extremity Assessment Details Patient Name: [name] Date of Service: 08/15/2018 10:45 AM Medical Record Number: [id number] Patient Account Number: [id number] Date of Birth/Sex: 27-Jul-1930 (84 y.o.  F) Treating RN: Rema [name] Primary Care Shaylen Nephew: Aram Beecham Other Clinician: Referring Iverna Hammac: Aram Beecham Treating Demauri Advincula/Extender: Altamese [place] in Treatment: 39 Electronic Signature(s) Signed: 08/15/2018 2:39:50 PM By: Rema [name] Entered By: Rema [name] on 08/15/2018 11:02:24 [name] ([id number]) -------------------------------------------------------------------------------- Multi Wound Chart Details Patient Name: [name] Date of Service: 08/15/2018 10:45 AM Medical Record Number: [id number] Patient Account Number: [id number] Date of Birth/Sex: [birth year] (84 y.o. F) Treating RN: Huel Coventry Primary Care Enma Maeda: Aram Beecham Other Clinician: Referring Anne-Marie Genson: Aram Beecham Treating Maxine Fredman/Extender: Altamese  in Treatment: 39 Vital Signs Height(in):  60 Pulse(bpm): 86 Weight(lbs): 123 Blood Pressure(mmHg): 137/68 Body Mass Index(BMI): 24 Temperature(F): 97.8 Respiratory Rate 18 (breaths/min): Photos: [N/A:N/A] Wound Location: Left Elbow N/A N/A Wounding Event: Trauma N/A N/A Primary Etiology: Trauma, Other N/A N/A Comorbid History: Cataracts, Glaucoma, Optic N/A N/A Neuritis, Chronic sinus problems/congestion, Middle ear problems Date Acquired: 01/26/2018 N/A N/A Weeks of Treatment: 15 N/A N/A Wound Status: Open N/A N/A Measurements L x W x D 0.5x0.3x0.3 N/A N/A (cm) Area (cm) : 0.118 N/A N/A Volume (cm) : 0.035 N/A N/A % Reduction in Area: -66.20% N/A N/A % Reduction in Volume: -150.00% N/A N/A Classification: Full Thickness With Exposed N/A N/A Support Structures Exudate Amount: Small N/A N/A Exudate Type: Serous N/A N/A Exudate Color: amber N/A N/A Wound Margin: Distinct, outline attached N/A N/A Granulation Amount: Small (1-33%) N/A N/A Granulation Quality: Pink N/A N/A Necrotic Amount: Large (67-100%) N/A N/A Necrotic Tissue: Eschar, Adherent Slough N/A N/A Exposed Structures: Fat Layer  (Subcutaneous N/A N/A Tissue) Exposed: Yes Fascia: No Natasha, Barnett ([id number]) Tendon: No Muscle: No Joint: No Bone: No Epithelialization: None N/A N/A Periwound Skin Texture: Callus: Yes N/A N/A Scarring: Yes Excoriation: No Induration: No Crepitus: No Rash: No Periwound Skin Moisture: Maceration: No N/A N/A Dry/Scaly: No Periwound Skin Color: Erythema: Yes N/A N/A Atrophie Blanche: No Cyanosis: No Ecchymosis: No Hemosiderin Staining: No Mottled: No Pallor: No Rubor: No Erythema Location: Circumferential N/A N/A Temperature: No Abnormality N/A N/A Tenderness on Palpation: Yes N/A N/A Wound Preparation: Ulcer Cleansing: N/A N/A Rinsed/Irrigated with Saline Topical Anesthetic Applied: Other: lidocaine 4% Procedures Performed: Cellular or Tissue Based N/A N/A Product Treatment Notes Electronic Signature(s) Signed: 08/15/2018 4:51:46 PM By: Baltazar Najjar MD Entered By: Baltazar Najjar on 08/15/2018 12:40:21 [name] ([id number]) -------------------------------------------------------------------------------- Multi-Disciplinary Care Plan Details Patient Name: [name] Date of Service: 08/15/2018 10:45 AM Medical Record Number: [id number] Patient Account Number: [id number] Date of Birth/Sex: 19-Aug-1930 (84 y.o. F) Treating RN: Huel Coventry Primary Care Laaibah Wartman: Aram Beecham Other Clinician: Referring Sonali Wivell: Aram Beecham Treating Kentley Cedillo/Extender: Altamese [place] in Treatment: 35 Active Inactive ` Soft Tissue Infection Nursing Diagnoses: Impaired tissue integrity Goals: Patient's soft tissue infection will resolve Date Initiated: 07/18/2018 Target Resolution Date: 07/27/2018 Goal Status: Active Interventions: Assess signs and symptoms of infection every visit Treatment Activities: Culture and sensitivity : 07/18/2018 Systemic antibiotics : 07/18/2018 Notes: ` Wound/Skin Impairment Nursing Diagnoses: Impaired tissue  integrity Knowledge deficit related to ulceration/compromised skin integrity Goals: Patient/caregiver will verbalize understanding of skin care regimen Date Initiated: 11/14/2017 Target Resolution Date: 11/28/2017 Goal Status: Active Ulcer/skin breakdown will have a volume reduction of 30% by week 4 Date Initiated: 11/14/2017 Target Resolution Date: 11/28/2017 Goal Status: Active Interventions: Assess patient/caregiver ability to obtain necessary supplies Assess patient/caregiver ability to perform ulcer/skin care regimen upon admission and as needed Assess ulceration(s) every visit Treatment Activities: Skin care regimen initiated : 11/14/2017 Natasha, Barnett ([id number]) Notes: Electronic Signature(s) Signed: 08/16/2018 5:22:10 PM By: Elliot Gurney, BSN, RN, CWS, Kim RN, BSN Entered By: Elliot Gurney, BSN, RN, CWS, Kim on 08/15/2018 11:44:05 [name] ([id number]) -------------------------------------------------------------------------------- Pain Assessment Details Patient Name: [name] Date of Service: 08/15/2018 10:45 AM Medical Record Number: [id number] Patient Account Number: [id number] Date of Birth/Sex: 03/11/1930 (84 y.o. F) Treating RN: Rema [name] Primary Care Sulaiman Imbert: Aram Beecham Other Clinician: Referring Dilia Alemany: Aram Beecham Treating Starlit Raburn/Extender: Altamese [place] in Treatment: 38 Active Problems Location of Pain Severity and Description of Pain Patient Has Paino No Site Locations Pain Management and Medication  Current Pain Management: Goals for Pain Management pt denies any pain at this time. Electronic Signature(s) Signed: 08/15/2018 2:39:50 PM By: Rema [name] Entered By: Rema [name] on 08/15/2018 11:01:43 [name] ([id number]) -------------------------------------------------------------------------------- Patient/Caregiver Education Details Patient Name: [name] Date of Service: 08/15/2018 10:45 AM Medical Record Number:  [id number] Patient Account Number: [id number] Date of Birth/Gender: 1930-03-16 (84 y.o. F) Treating RN: Curtis Sites Primary Care Physician: Aram Beecham Other Clinician: Referring Physician: Aram Beecham Treating Physician/Extender: Altamese [place] in Treatment: 57 Education Assessment Education Provided To: Patient Education Topics Provided Wound/Skin Impairment: Handouts: Other: change cover bandage if needed Methods: Demonstration, Explain/Verbal Responses: State content correctly Electronic Signature(s) Signed: 08/15/2018 4:57:10 PM By: Curtis Sites Entered By: Curtis Sites on 08/15/2018 14:59:21 [name] ([id number]) -------------------------------------------------------------------------------- Wound Assessment Details Patient Name: [name] Date of Service: 08/15/2018 10:45 AM Medical Record Number: [id number] Patient Account Number: [id number] Date of Birth/Sex: [birth year] (84 y.o. F) Treating RN: Rema [name] Primary Care Korinna Tat: Aram Beecham Other Clinician: Referring Arabell Neria: Aram Beecham Treating Kadiatou Oplinger/Extender: Altamese [place] in Treatment: 39 Wound Status Wound Number: [id number] Primary Trauma, Other Etiology: Wound Location: Left Elbow Wound Open Wounding Event: Trauma Status: Date Acquired: 01/26/2018 Comorbid Cataracts, Glaucoma, Optic Neuritis, Chronic Weeks Of Treatment: 15 History: sinus problems/congestion, Middle ear problems Clustered Wound: No Photos Photo Uploaded By: Rema [name] on 08/15/2018 11:13:42 Wound Measurements Length: (cm) 0.5 Width: (cm) 0.3 Depth: (cm) 0.3 Area: (cm) 0.118 Volume: (cm) 0.035 % Reduction in Area: -66.2% % Reduction in Volume: -150% Epithelialization: None Tunneling: No Undermining: No Wound Description Full Thickness With Exposed Support Classification: Structures Wound Margin: Distinct, outline attached Exudate Small Amount: Exudate Type: Serous Exudate Color:  amber Foul Odor After Cleansing: No Slough/Fibrino Yes Wound Bed Granulation Amount: Small (1-33%) Exposed Structure Granulation Quality: Pink Fascia Exposed: No Necrotic Amount: Large (67-100%) Fat Layer (Subcutaneous Tissue) Exposed: Yes Necrotic Quality: Eschar, Adherent Slough Tendon Exposed: No Muscle Exposed: No Joint Exposed: No Bone Exposed: No Natasha, [name] ([id number]) Periwound Skin Texture Texture Color No Abnormalities Noted: No No Abnormalities Noted: No Callus: Yes Atrophie Blanche: No Crepitus: No Cyanosis: No Excoriation: No Ecchymosis: No Induration: No Erythema: Yes Rash: No Erythema Location: Circumferential Scarring: Yes Hemosiderin Staining: No Mottled: No Moisture Pallor: No No Abnormalities Noted: No Rubor: No Dry / Scaly: No Maceration: No Temperature / Pain Temperature: No Abnormality Tenderness on Palpation: Yes Wound Preparation Ulcer Cleansing: Rinsed/Irrigated with Saline Topical Anesthetic Applied: Other: lidocaine 4%, Electronic Signature(s) Signed: 08/15/2018 2:39:50 PM By: Rema [name] Entered By: Rema [name] on 08/15/2018 11:10:01 [name] ([id number]) -------------------------------------------------------------------------------- Vitals Details Patient Name: [name] Date of Service: 08/15/2018 10:45 AM Medical Record Number: [id number] Patient Account Number: [id number] Date of Birth/Sex: 04-02-1930 (85 y.o. F) Treating RN: Rema [name] Primary Care Daeshaun Specht: Aram Beecham Other Clinician: Referring Loralei Radcliffe: Aram Beecham Treating Damico Partin/Extender: Altamese [place] in Treatment: 39 Vital Signs Time Taken: 11:00 Temperature (F): 97.8 Height (in): 60 Pulse (bpm): 86 Weight (lbs): 123 Respiratory Rate (breaths/min): 18 Body Mass Index (BMI): 24 Blood Pressure (mmHg): 137/68 Reference Range: 80 - 120 mg / dl Electronic Signature(s) Signed: 08/15/2018 2:39:50 PM By: Rema [name] Entered By: Rema [name]  on 08/15/2018 11:02:13

## 2018-08-29 ENCOUNTER — Encounter: Payer: Medicare Other | Admitting: Internal Medicine

## 2018-08-29 DIAGNOSIS — L98495 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: Secondary | ICD-10-CM | POA: Diagnosis not present

## 2018-09-01 NOTE — Progress Notes (Signed)
[name], [name] ([id number]) Visit Report for 08/29/2018 Arrival Information Details Patient Name: [name], [name] Date of Service: 08/29/2018 9:30 AM Medical Record Number: [id number] Patient Account Number: [id number] Date of Birth/Sex: [birth year] (84 y.o. F) Treating RN: Huel Coventry Primary Care Mackenzye Mackel: Aram Beecham Other Clinician: Referring Marvetta Vohs: Aram Beecham Treating Annamae Shivley/Extender: Altamese [place] in Treatment: 44 Visit Information History Since Last Visit Added or deleted any medications: No Patient Arrived: Walker Any new allergies or adverse reactions: No Arrival Time: 09:39 Had a fall or experienced change in No Accompanied By: self activities of daily living that may affect Transfer Assistance: None risk of falls: Patient Requires Transmission-Based No Signs or symptoms of abuse/neglect since last visito No Precautions: Hospitalized since last visit: No Patient Has Alerts: Yes Implantable device outside of the clinic excluding No Patient Alerts: non compressible left cellular tissue based products placed in the center leg since last visit: Has Dressing in Place as Prescribed: Yes Pain Present Now: No Electronic Signature(s) Signed: 08/29/2018 3:44:28 PM By: Dayton Martes RCP, RRT, CHT Entered By: Dayton Martes on 08/29/2018 09:40:45 [name] ([id number]) -------------------------------------------------------------------------------- Encounter Discharge Information Details Patient Name: [name] Date of Service: 08/29/2018 9:30 AM Medical Record Number: [id number] Patient Account Number: [id number] Date of Birth/Sex: 08/11/1930 (84 y.o. F) Treating RN: Curtis Sites Primary Care Blayde Bacigalupi: Aram Beecham Other Clinician: Referring Dashanique Brownstein: Aram Beecham Treating Lundon Rosier/Extender: Altamese [place] in Treatment: 16 Encounter Discharge Information Items Discharge Condition:  Stable Ambulatory Status: Walker Discharge Destination: Home Transportation: Private Auto Accompanied By: self Schedule Follow-up Appointment: Yes Clinical Summary of Care: Post Procedure Vitals: Temperature (F): 98.0 Pulse (bpm): 72 Respiratory Rate (breaths/min): 18 Blood Pressure (mmHg): 145/49 Electronic Signature(s) Signed: 08/29/2018 10:38:49 AM By: Curtis Sites Entered By: Curtis Sites on 08/29/2018 10:38:49 [name] ([id number]) -------------------------------------------------------------------------------- Lower Extremity Assessment Details Patient Name: [name] Date of Service: 08/29/2018 9:30 AM Medical Record Number: [id number] Patient Account Number: [id number] Date of Birth/Sex: [birth year] (83 y.o. F) Treating RN: Rema [name] Primary Care Sharmeka Palmisano: Aram Beecham Other Clinician: Referring Percy Winterrowd: Aram Beecham Treating Aldair Rickel/Extender: Altamese [place] in Treatment: 41 Electronic Signature(s) Signed: 08/29/2018 1:04:55 PM By: Rema [name] Entered By: Rema [name] on 08/29/2018 09:55:09 [name] ([id number]) -------------------------------------------------------------------------------- Multi Wound Chart Details Patient Name: [name] Date of Service: 08/29/2018 9:30 AM Medical Record Number: [id number] Patient Account Number: [id number] Date of Birth/Sex: Nov 20, 1930 (83 y.o. F) Treating RN: Huel Coventry Primary Care Erica Osuna: Aram Beecham Other Clinician: Referring Alma Mohiuddin: Aram Beecham Treating Zaydenn Balaguer/Extender: Altamese [place] in Treatment: 67 Vital Signs Height(in): 60 Pulse(bpm): 72 Weight(lbs): 123 Blood Pressure(mmHg): 145/49 Body Mass Index(BMI): 24 Temperature(F): 98.0 Respiratory Rate 18 (breaths/min): Photos: [N/A:N/A] Wound Location: Left Elbow N/A N/A Wounding Event: Trauma N/A N/A Primary Etiology: Trauma, Other N/A N/A Comorbid History: Cataracts, Glaucoma, Optic N/A  N/A Neuritis, Chronic sinus problems/congestion, Middle ear problems Date Acquired: 01/26/2018 N/A N/A Weeks of Treatment: 17 N/A N/A Wound Status: Open N/A N/A Measurements L x W x D 0.9x0.5x0.3 N/A N/A (cm) Area (cm) : 0.353 N/A N/A Volume (cm) : 0.106 N/A N/A % Reduction in Area: -397.20% N/A N/A % Reduction in Volume: -657.10% N/A N/A Starting Position 1 7 (o'clock): Ending Position 1 11 (o'clock): Maximum Distance 1 (cm): 0.8 Undermining: Yes N/A N/A Classification: Full Thickness With Exposed N/A N/A Support Structures Exudate Amount: Small N/A N/A Exudate Type: Purulent N/A N/A Exudate Color: yellow, brown, green N/A N/A Wound Margin: Distinct, outline  attached N/A N/A Granulation Amount: None Present (0%) N/A N/A [name], [name] ([id number]) Necrotic Amount: Large (67-100%) N/A N/A Necrotic Tissue: Eschar, Adherent Slough N/A N/A Exposed Structures: Fat Layer (Subcutaneous N/A N/A Tissue) Exposed: Yes Fascia: No Tendon: No Muscle: No Joint: No Bone: No Epithelialization: None N/A N/A Debridement: Debridement - Excisional N/A N/A Pre-procedure 10:01 N/A N/A Verification/Time Out Taken: Pain Control: Other N/A N/A Tissue Debrided: Subcutaneous N/A N/A Level: Skin/Subcutaneous Tissue N/A N/A Debridement Area (sq cm): 0.45 N/A N/A Instrument: Curette N/A N/A Bleeding: None N/A N/A Debridement Treatment Procedure was tolerated well N/A N/A Response: Post Debridement 0.9x0.5x0.3 N/A N/A Measurements L x W x D (cm) Post Debridement Volume: 0.106 N/A N/A (cm) Periwound Skin Texture: Callus: Yes N/A N/A Scarring: Yes Excoriation: No Induration: No Crepitus: No Rash: No Periwound Skin Moisture: Maceration: Yes N/A N/A Dry/Scaly: No Periwound Skin Color: Erythema: Yes N/A N/A Atrophie Blanche: No Cyanosis: No Ecchymosis: No Hemosiderin Staining: No Mottled: No Pallor: No Rubor: No Erythema Location: Circumferential N/A N/A Temperature: No  Abnormality N/A N/A Tenderness on Palpation: Yes N/A N/A Wound Preparation: Ulcer Cleansing: N/A N/A Rinsed/Irrigated with Saline Topical Anesthetic Applied: Other: lidocaine 4% Procedures Performed: Cellular or Tissue Based N/A N/A Product Debridement Treatment Notes Wound #9 (Left Elbow) 1. Cleansed with: [name], [name] ([id number]) Clean wound with Normal Saline 2. Anesthetic Topical Lidocaine 4% cream to wound bed prior to debridement 3. Peri-wound Care: Skin Prep 4. Dressing Applied: Mepitel Other dressing (specify in notes) 5. Secondary Dressing Applied Bordered Foam Dressing Notes grafix pl applied in clinic today, mepitel, steri strips, and bordered foam Electronic Signature(s) Signed: 08/29/2018 4:51:54 PM By: Baltazar Najjar MD Entered By: Baltazar Najjar on 08/29/2018 10:49:51 [name] ([id number]) -------------------------------------------------------------------------------- Multi-Disciplinary Care Plan Details Patient Name: [name] Date of Service: 08/29/2018 9:30 AM Medical Record Number: [id number] Patient Account Number: [id number] Date of Birth/Sex: 1930-10-25 (84 y.o. F) Treating RN: Huel Coventry Primary Care Govind Furey: Aram Beecham Other Clinician: Referring Reggie Bise: Aram Beecham Treating Ameka Krigbaum/Extender: Altamese [place] in Treatment: 13 Active Inactive ` Soft Tissue Infection Nursing Diagnoses: Impaired tissue integrity Goals: Patient's soft tissue infection will resolve Date Initiated: 07/18/2018 Target Resolution Date: 07/27/2018 Goal Status: Active Interventions: Assess signs and symptoms of infection every visit Treatment Activities: Culture and sensitivity : 07/18/2018 Systemic antibiotics : 07/18/2018 Notes: ` Wound/Skin Impairment Nursing Diagnoses: Impaired tissue integrity Knowledge deficit related to ulceration/compromised skin integrity Goals: Patient/caregiver will verbalize understanding of skin care  regimen Date Initiated: 11/14/2017 Target Resolution Date: 11/28/2017 Goal Status: Active Ulcer/skin breakdown will have a volume reduction of 30% by week 4 Date Initiated: 11/14/2017 Target Resolution Date: 11/28/2017 Goal Status: Active Interventions: Assess patient/caregiver ability to obtain necessary supplies Assess patient/caregiver ability to perform ulcer/skin care regimen upon admission and as needed Assess ulceration(s) every visit Treatment Activities: Skin care regimen initiated : 11/14/2017 [name], [name] ([id number]) Notes: Electronic Signature(s) Signed: 08/30/2018 5:07:26 PM By: Elliot Gurney, BSN, RN, CWS, Kim RN, BSN Entered By: Elliot Gurney, BSN, RN, CWS, Kim on 08/29/2018 10:00:44 [name] ([id number]) -------------------------------------------------------------------------------- Pain Assessment Details Patient Name: [name] Date of Service: 08/29/2018 9:30 AM Medical Record Number: [id number] Patient Account Number: [id number] Date of Birth/Sex: 04/27/1930 (84 y.o. F) Treating RN: Huel Coventry Primary Care Tyquarius Paglia: Aram Beecham Other Clinician: Referring Ashantia Amaral: Aram Beecham Treating Codee Tutson/Extender: Altamese [place] in Treatment: 21 Active Problems Location of Pain Severity and Description of Pain Patient Has Paino No Site Locations Pain Management and Medication Current  Pain Management: Electronic Signature(s) Signed: 08/29/2018 3:44:28 PM By: Dayton Martes RCP, RRT, CHT Signed: 08/30/2018 5:07:26 PM By: Elliot Gurney, BSN, RN, CWS, Kim RN, BSN Entered By: Dayton Martes on 08/29/2018 09:40:53 [name] ([id number]) -------------------------------------------------------------------------------- Patient/Caregiver Education Details Patient Name: [name] Date of Service: 08/29/2018 9:30 AM Medical Record Number: [id number] Patient Account Number: [id number] Date of Birth/Gender: 24-Feb-1930 (84 y.o.  F) Treating RN: Curtis Sites Primary Care Physician: Aram Beecham Other Clinician: Referring Physician: Aram Beecham Treating Physician/Extender: Altamese [place] in Treatment: 66 Education Assessment Education Provided To: Patient Education Topics Provided Wound/Skin Impairment: Handouts: Other: wound care if needed Methods: Demonstration, Explain/Verbal Responses: State content correctly Electronic Signature(s) Signed: 08/29/2018 4:04:54 PM By: Curtis Sites Entered By: Curtis Sites on 08/29/2018 10:39:11 [name] ([id number]) -------------------------------------------------------------------------------- Wound Assessment Details Patient Name: [name] Date of Service: 08/29/2018 9:30 AM Medical Record Number: [id number] Patient Account Number: [id number] Date of Birth/Sex: November 18, 1930 (84 y.o. F) Treating RN: Rema [name] Primary Care Kashaun Bebo: Aram Beecham Other Clinician: Referring Lamine Laton: Aram Beecham Treating Calee Nugent/Extender: Altamese [place] in Treatment: 41 Wound Status Wound Number: 9 Primary Trauma, Other Etiology: Wound Location: Left Elbow Wound Open Wounding Event: Trauma Status: Date Acquired: 01/26/2018 Comorbid Cataracts, Glaucoma, Optic Neuritis, Chronic Weeks Of Treatment: 17 History: sinus problems/congestion, Middle ear problems Clustered Wound: No Photos Photo Uploaded By: Rema [name] on 08/29/2018 09:58:58 Wound Measurements Length: (cm) 0.9 Width: (cm) 0.5 Depth: (cm) 0.3 Area: (cm) 0.353 Volume: (cm) 0.106 % Reduction in Area: -397.2% % Reduction in Volume: -657.1% Epithelialization: None Tunneling: No Undermining: Yes Starting Position (o'clock): 7 Ending Position (o'clock): 11 Maximum Distance: (cm) 0.8 Wound Description Full Thickness With Exposed Support Foul Odo Classification: Structures Slough/F Wound Margin: Distinct, outline attached Exudate Small Amount: Exudate Type:  Purulent Exudate Color: yellow, brown, green r After Cleansing: No ibrino Yes Wound Bed Granulation Amount: None Present (0%) Exposed Structure Necrotic Amount: Large (67-100%) Fascia Exposed: No Necrotic Quality: Eschar, Adherent Slough Fat Layer (Subcutaneous Tissue) Exposed: Yes [name], [name] ([id number]) Tendon Exposed: No Muscle Exposed: No Joint Exposed: No Bone Exposed: No Periwound Skin Texture Texture Color No Abnormalities Noted: No No Abnormalities Noted: No Callus: Yes Atrophie Blanche: No Crepitus: No Cyanosis: No Excoriation: No Ecchymosis: No Induration: No Erythema: Yes Rash: No Erythema Location: Circumferential Scarring: Yes Hemosiderin Staining: No Mottled: No Moisture Pallor: No No Abnormalities Noted: No Rubor: No Dry / Scaly: No Maceration: Yes Temperature / Pain Temperature: No Abnormality Tenderness on Palpation: Yes Wound Preparation Ulcer Cleansing: Rinsed/Irrigated with Saline Topical Anesthetic Applied: Other: lidocaine 4%, Treatment Notes Wound #9 (Left Elbow) 1. Cleansed with: Clean wound with Normal Saline 2. Anesthetic Topical Lidocaine 4% cream to wound bed prior to debridement 3. Peri-wound Care: Skin Prep 4. Dressing Applied: Mepitel Other dressing (specify in notes) 5. Secondary Dressing Applied Bordered Foam Dressing Notes grafix pl applied in clinic today, mepitel, steri strips, and bordered foam Electronic Signature(s) Signed: 08/29/2018 1:04:55 PM By: Rema [name] Entered By: Rema [name] on 08/29/2018 09:54:59 [name] ([id number]) -------------------------------------------------------------------------------- Vitals Details Patient Name: [name] Date of Service: 08/29/2018 9:30 AM Medical Record Number: [id number] Patient Account Number: [id number] Date of Birth/Sex: [birth year] (84 y.o. F) Treating RN: Huel Coventry Primary Care Remington Skalsky: Aram Beecham Other Clinician: Referring Naja Apperson: Aram Beecham Treating Fulton Merry/Extender: Altamese [place] in Treatment: 97 Vital Signs Time Taken: 09:40 Temperature (F): 98.0 Height (in): 60 Pulse (bpm): 72 Weight (lbs): 123 Respiratory Rate (breaths/min): 18  Body Mass Index (BMI): 24 Blood Pressure (mmHg): 145/49 Reference Range: 80 - 120 mg / dl Electronic Signature(s) Signed: 08/29/2018 3:44:28 PM By: Dayton Martes RCP, RRT, CHT Entered By: Weyman Rodney, Lucio Edward on 08/29/2018 09:43:23

## 2018-09-01 NOTE — Progress Notes (Signed)
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 which is specifically the Iodoflex. Obviously this is a known side effect of the iodine in the dressing and I believe that may be giving her trouble. No fevers, chills, nausea, or vomiting noted at this time. Otherwise the wound does appear to be doing well. 01/30/18 on evaluation today patient appears to be doing well in regard to her right anterior lower extremity ulcer. She notes that this does seem to be smaller and she wonders why we did not start the Prisma dressing sooner since it has made such a big difference in such a short amount of time. I explained that obviously we have to wait for the wound to get to a certain point along his healing path before we can initiate the Prisma otherwise it will not be effective. Therefore once the wound became KALYNNE, WOMAC ([id number]) clean it was then time to initiate the Prisma. Nonetheless good news is she is noting excellent improvement she does still have some discomfort but nothing as significant as previously noted. 04/17/18 on evaluation today patient  appears to be doing very well and in fact her right lower extremity ulcer has completely healed at this point I'm pleased with this. The left lower extremity ulcer seem to be doing better although she still does have some openings noted the Prisma I think is helping more than the Xeroform was in my pinion. With that being said she still has a lot of healing to do in this regard. 04/27/18 on evaluation today patient appears to be doing very well in regard to her left lower Trinity ulcers. She has been tolerating the dressing changes without complication. I do have a note from her orthopedic surgeon today and they would like for me to help with treating her left elbow surgery site where she had the bursa removed and this was performed roughly 4 weeks ago according to the note that I reviewed. She has been placed on Bactrim DS by need for her leg wounds this probably helped a little bit with the left elbow surgery site. Obviously I do think this is something we can try to help her out with. 05/04/18 on evaluation today patient appears to be doing well in regard to her left  anterior lower Trinity ulcers. She is making good progress which is great news. Unfortunately her elbow which we are also managing at this point in time has not made as much progress unfortunately. She has been tolerating the dressing changes without complication. She did see Dr. Darleen Crocker earlier today and he states that he's willing to give this three weeks to see if she's making any progress with wound care. However he states that she's really not then he will need to go back in and perform further surgery. Obviously she is trying to avoid surgery if at all possible although I'm not sure if this is going to be possible or least not that quickly. 05/11/18 on evaluation today patient appears to be doing very well in regard to her left lower extremity ulcers. Unfortunately in regard to her elbow this is very slow coming about as far as any  improvement is concerned. I do feel like there may be a little bit more granulation noted in the base of the wound but nothing too significant unfortunately. I still can probe bone in the proximal portion of the wound which obviously explain to the patient is not good. She will be having a follow-up with her orthopedic surgeon in the next couple of weeks. In the meantime we are trying to do as much as we can to try to show signs of improvement in healing to avoid the need for any additional and further surgery. Nonetheless I explained to the patient yet again today I'm not sure if that is going to be feasible or not obviously it's more risk for her to continue to have an open wound with bone exposure then to the back in for additional surgery even though I know she doesn't want to go that route. 05/15/18 on evaluation today patient presents for follow-up concerning her ongoing lower extremity ulcers on the left as well as the left elbow ulcer. She has at this point in time been tolerating the dressing changes without complication. Her left lower extremity ulcer appears to be doing very well. In regard to the left elbow ulcer she actually does seem to have additional granulation today which is good news. I am definitely seeing signs of improvement although obviously this is somewhat slow improvement. Nonetheless I'm hopeful we will be able to avoid her having to have any further surgery but again that would definitely be a conversation between herself as well as her surgeon once he sees her for reevaluation. Otherwise she does want to see about having a three order compression stockings for her today 05/21/18 on evaluation today patient appears to be doing well in regard to her left lower surety ulcer. This is almost completely healed and seems to be progressing very nicely. With that being said her left elbow is another story. I'm not really convinced in the past three weeks we've seen a significant  improvement in this wound. With that being said if this is something that there is no surgical option for him we have to continue to work on this from the standpoint of conservative management with wound care she may make improvement given time. Nonetheless it appears that her surgeon is somewhat concerned about the possibility of infection and really is leaning towards additional surgery to try and help close this wound. Nonetheless the patient is still unsure of exactly what to do. 05/29/18 on evaluation today patient appears to be doing well in regard to her left lower extremity ulcer. She's been tolerating the dressing changes  without complication which is good news. With that being said she's been having issues specifically with her elbow she did see her surgeon Dr. Joice Lofts and he is recommending a repeat surgery to the left elbow in order to correct the issue. The patient is still somewhat unsure of this but feels like this may be better than trying to take time to let this heal over a longer period of time through normal wound care measures. Again I explained that I agree this may be a faster way to go if her surgeon feels that this is indeed a good direction to take. Obviously only he can make the judgment on whether or not the surgery would likely be successful. 06/04/18 on evaluation today patient actually presents for follow-up concerning her left elbow and left lower from the ulcer she seems to be doing very well at this point in time. She has been tolerating the dressing changes without complication. With that being said her elbow is not significantly better she actually is scheduled for surgery tomorrow. 07/04/18; the patient had an area on her left leg that is remaining closed. The open area she has now is a postsurgical wound on the left elbow. I think we have clearance from the surgeon to see this now. [address] [place] V[place]land Kitchen ([id number]) 07/11/18; we're currently dealing with  a surgical wound on the left olecranon process. The patient complains of a lot of pain and drainage. When I saw her last week we did an x-ray that showed soft tissue wound and probable elbow joint effusion but no erosion to suggest osteomyelitis. The culture I did of this was somewhat surprisingly negative. She has a small open wound with not a viable surface there is considerable undermining relative to the wound size. She is on methotrexate for rheumatoid arthritis/overlap syndrome also plaquenil. We've been using silver collagen 07/18/18-She is seen in follow-up evaluation for a left elbow wound. There is essentially no change. She is currently on Zithromax and will complete that on Friday, there is no indication to extend this. We will change to iodosorb/iodoflex and monitor for response 07/25/18-She is seen in follow-up evaluation for left elbow wound. The wound is stable with no overt evidence of infection. She has counseled with her rheumatologist. She is wanting to restart her methotrexate; a culture was obtained to rule out occult infection before starting her methotrexate. We will continue with Iodosorb/Iodoflex and she will follow-up next week. 08/01/18; this is a difficult wound over her left olecranon process. There is been concerned about infection although cultures including one done last week were negative. Pending 3 weeks ago I gave her an empiric course of antibiotics. She is having a lot of rheumatologic pain in her hands with pain and stiffness. She wants to go on her weekly methotrexate and I think it would be reasonable to do so. We have been using Iodoflex 08/01/18; difficult wound over her left olecranon process. She started back on methotrexate last week because of rheumatologic pain in her hands. We have been using Iodoflex to try and clean out the wound bed. She has been approved for Graphix PL 08/15/18; 2 week follow-up. Difficult wound over her left olecranon process. Graphix  PL #1 with collagen backing 08/22/18; one-week follow-up. Difficult wound over her left olecranon process. Graphix PL #2 08/29/18; no major improvement. Difficult wound over her left olecranon process. Still considerable undermining. Graphics PL #3 Objective Constitutional Patient is hypertensive.. Pulse regular and within target range for patient.Marland Kitchen Respirations  regular, non-labored and within target range.. Temperature is normal and within the target range for the patient.Marland Kitchen appears in no distress. Vitals Time Taken: 9:40 AM, Height: 60 in, Weight: 123 lbs, BMI: 24, Temperature: 98.0 F, Pulse: 72 bpm, Respiratory Rate: 18 breaths/min, Blood Pressure: 145/49 mmHg. General Notes: wound exam; wound is debrided with #3 curet. Necrotic surface debris. Graphix PL applied in the standard fashion. No evidence of infection Integumentary (Hair, Skin) Wound #9 status is Open. Original cause of wound was Trauma. The wound is located on the Left Elbow. The wound measures 0.9cm length x 0.5cm width x 0.3cm depth; 0.353cm^2 area and 0.106cm^3 volume. There is Fat Layer (Subcutaneous Tissue) Exposed exposed. There is no tunneling noted, however, there is undermining starting at 7:00 and ending at 11:00 with a maximum distance of 0.8cm. There is a small amount of purulent drainage noted. The wound margin is distinct with the outline attached to the wound base. There is no granulation within the wound bed. There is a large (67-100%) amount of necrotic tissue within the wound bed including Eschar and Adherent Slough. The periwound skin appearance exhibited: Callus, Scarring, Maceration, Erythema. The periwound skin appearance did not exhibit: Crepitus, Excoriation, Induration, Rash, Dry/Scaly, Atrophie Blanche, Cyanosis, Ecchymosis, Hemosiderin Staining, Mottled, Pallor, Rubor. The surrounding wound skin color is noted with erythema which is circumferential. Periwound temperature was noted as No Abnormality. The  periwound has tenderness on palpation. 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 There was a Excisional Skin/Subcutaneous Tissue Debridement with a total area of 0.45 sq cm performed by Maxwell Caul, MD. With the following instrument(s): Curette to remove Viable tissue/material. Material removed includes Subcutaneous Tissue after achieving pain control using Other (idocaone). No specimens were taken. A time out was conducted at 10:01, prior to the start of the procedure. There was no bleeding. The procedure was tolerated well. Post Debridement Measurements: 0.9cm length x 0.5cm width x 0.3cm depth; 0.106cm^3 volume. Character of Wound/Ulcer Post Debridement is stable. Post procedure Diagnosis Wound #9: Same as Pre-Procedure Pre-procedure diagnosis of Wound #9 is a Trauma, Other located on the Left Elbow. A skin graft procedure using a bioengineered skin substitute/cellular or tissue based product was performed by Maxwell Caul, MD with the following instrument(s): Forceps. Other was applied and secured with Steri-Strips. 4 sq cm of product was utilized and 0 sq cm was wasted. Post Application, mepitel was applied. A Time Out was conducted at 10:05, prior to the start of the procedure. The procedure was tolerated well with a pain level of 0 throughout and a pain level of 0 following the procedure. Post procedure Diagnosis Wound #9: Same as Pre-Procedure . Plan Wound Cleansing: Wound #9 Left Elbow: Clean wound with Normal Saline. Anesthetic (add to Medication List): Wound #9 Left Elbow: Topical Lidocaine 4% cream applied to wound bed prior to debridement (In Clinic Only). Skin Barriers/Peri-Wound Care: Wound  #9 Left Elbow: Skin Prep Primary Wound Dressing: Other: - Grafix PL application in clinic; including contact layer, fixation with steri strips, dry gauze and cover dressing. Dressing Change Frequency: Wound #9 Left Elbow: Change dressing every week - may change outer dressing if it gets soiled or wet Follow-up Appointments: Wound #9 Left Elbow: ZABDI, MIS ([id number]) Return Appointment in 1  week. Advanced Therapies: Wound #9 Left Elbow: Grafix PL application in clinic; including contact layer, fixation with steri strips, dry gauze and cover dressing. #1 graphix Pl #3 applied Electronic Signature(s) Signed: 08/29/2018 4:51:54 PM By: Baltazar Najjar MD Entered By: Baltazar Najjar on 08/29/2018 10:55:19 [name] ([id number]) -------------------------------------------------------------------------------- SuperBill Details Patient Name: [name] Date of Service: 08/29/2018 Medical Record Number: [id number] Patient Account Number: [id number] Date of Birth/Sex: 20-Jan-1930 (84 y.o. 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 CPT4: 30865784 Q4133- Grafix PL Prime 1.5 x 2 cm 4 Physician Procedures CPT4: Description Modifier Quantity Code [id number] 15271 - WC PHYS SKIN SUB GRAFT TRNK/ARM/LEG 1 ICD-10 Diagnosis Description L98.495 Non-pressure chronic ulcer of skin of other sites  with muscle involvement without evidence of necrosis Electronic Signature(s) Unsigned Previous Signature: 08/29/2018 4:51:54 PM Version By: Baltazar Najjar MD Previous Signature: 08/29/2018 10:13:48 AM Version By: Elliot Gurney, BSN, RN, CWS, Kim RN, BSN Previous Signature: 08/29/2018 10:13:35 AM Version By: Elliot Gurney, BSN, RN, CWS, Kim RN, BSN Entered By: Francie Massing on 08/30/2018 15:09:51 Signature(s): Date(s):

## 2018-09-05 ENCOUNTER — Encounter: Payer: Medicare Other | Admitting: Internal Medicine

## 2018-09-05 ENCOUNTER — Ambulatory Visit: Payer: Medicare Other

## 2018-09-05 DIAGNOSIS — L98495 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: Secondary | ICD-10-CM | POA: Diagnosis not present

## 2018-09-06 ENCOUNTER — Ambulatory Visit: Payer: Medicare Other | Attending: Neurology

## 2018-09-06 DIAGNOSIS — M545 Low back pain: Secondary | ICD-10-CM | POA: Diagnosis present

## 2018-09-06 DIAGNOSIS — M6281 Muscle weakness (generalized): Secondary | ICD-10-CM

## 2018-09-06 DIAGNOSIS — R2681 Unsteadiness on feet: Secondary | ICD-10-CM | POA: Insufficient documentation

## 2018-09-06 DIAGNOSIS — R262 Difficulty in walking, not elsewhere classified: Secondary | ICD-10-CM | POA: Insufficient documentation

## 2018-09-06 DIAGNOSIS — Z9181 History of falling: Secondary | ICD-10-CM | POA: Diagnosis present

## 2018-09-06 DIAGNOSIS — G8929 Other chronic pain: Secondary | ICD-10-CM | POA: Diagnosis present

## 2018-09-06 NOTE — Therapy (Signed)
[facility] [address] [place], [address] Phone: [phone]   Fax:  [phone]  Physical Therapy Evaluation  Patient Details  Name: [name] MRN: [id number] Date of Birth: May 28, 1930 Referring Provider (PT): Cristopher Peru, MD   Encounter Date: 09/06/2018  PT End of Session - 09/06/18 1624    Visit Number  1    Number of Visits  17    Date for PT Re-Evaluation  11/01/18    Authorization Type  1    Authorization Time Period  of 10 progress report    PT Start Time  1625   pt arrived late   PT Stop Time  1728    PT Time Calculation (min)  63 min    Equipment Utilized During Treatment  Gait belt    Activity Tolerance  Patient tolerated treatment well    Behavior During Therapy  WFL for tasks assessed/performed       Past Medical History:  Diagnosis Date  . Anemia   . GERD (gastroesophageal reflux disease)   . Hypertension   . Peripheral vascular disease (HCC)    possible neuropathies in lower extremeties  . PONV (postoperative nausea and vomiting)    happens sometimes but better with pre med of zofran  . Presence of permanent cardiac pacemaker   . Syncope     Past Surgical History:  Procedure Laterality Date  . ABDOMINAL HYSTERECTOMY  1969  . APPENDECTOMY  1969   with hysterectomy  . BACK SURGERY  2001   rods in back. surgery on back x 3  . COLONOSCOPY    . I&D EXTREMITY Left 03/27/2018   Procedure: IRRIGATION AND DEBRIDEMENT LEFT ELBOW / OLECRANON BURSA;  Surgeon: Christena Flake, MD;  Location: [facility];  Service: Orthopedics;  Laterality: Left;  . I&D EXTREMITY Left 06/05/2018   Procedure: IRRIGATION AND DEBRIDEMENT EXTREMITY;  Surgeon: Christena Flake, MD;  Location: [facility];  Service: Orthopedics;  Laterality: Left;  . INSERT / REPLACE / REMOVE PACEMAKER  2004  . JOINT REPLACEMENT Left 2004   partial knee replacement  . OLECRANON BURSECTOMY Left 03/27/2018   Procedure: LEFT OLECRANON  BURSA;  Surgeon: Christena Flake, MD;  Location: [facility];  Service: Orthopedics;  Laterality: Left;  . PACEMAKER INSERTION  2014   dual chamber for complete heart block    There were no vitals filed for this visit.   Subjective Assessment - 09/06/18 1627    Subjective  Back pain: 0/10 in sitting, 6-7/10 ache when walking with rw; 8/10 at most for the past 3 months. No LE pain but feels weakness L LE > R.     Pertinent History  Imparied gait. Pt states that she has had balance issues for too long. Feels more comfortable walking with her rw. Had tests which showed that she has neuropathy. However the doctor at [facility] said that her balance is coming from her back. Just wants to be strong so she can walk.  Started walking with her rw off and on for the past several months.  Also feels like she might need a new sleep number bed. Also had 2 wound surgeries at [name]. Pt fell in February 2019. Had to stop PT due to the L elbow surgery April 06, 2018 to remove a bursa. The wound still has not healed.   Currently sees a wound doctor.   Feels numbness L foot from the ankle down.  No R foot numbness.  Sometimes unsure of where her L LE is in space.  Pt also states that she tires too quickly.  Has not been able to walk her dog in 2 years. Has not really been doing exercises at home except the sit <> stand. Pt states that prolonged sitting without back support will increase back pain     Patient Stated Goals  Improve LE strength. Wants to use the machines at the gym. Walk more steadily. Be able to take her dog (16 lbs) out on a leash.     Currently in Pain?  Yes    Pain Score  6     Pain Location  Back    Pain Orientation  Posterior    Pain Descriptors / Indicators  Aching;Nagging    Pain Type  Chronic pain    Pain Onset  More than a month ago    Pain Frequency  Occasional    Aggravating Factors   picking her L foot up causes L hip pain. Sitting for too long, standing tolerance of 15-20 minutes          [facility] PT Assessment - 09/06/18 1623      Assessment   Medical Diagnosis  Impaired gait    Referring Provider (PT)  Cristopher Peru, MD    Onset Date/Surgical Date  08/08/18   Date PT referral signed. Chronic condition   Prior Therapy  yes      Balance Screen   Has the patient fallen in the past 6 months  No   Last fall was in February 2019.    Has the patient had a decrease in activity level because of a fear of falling?   Yes    Is the patient reluctant to leave their home because of a fear of falling?   No      Home Environment   Living Environment  Private residence    Living Arrangements  Alone    Type of Home  House    Home Access  Stairs to enter    Entrance Stairs-Number of Steps  5    Entrance Stairs-Rails  Right    Home Layout  Two level;Able to live on main level with bedroom/bathroom    Home Equipment  Cane - single point    Additional Comments  also has a rw      Prior Function   Level of Independence  Requires assistive device for independence    Vocation  Retired      IT consultant   Overall Cognitive Status  Within Functional Limits for tasks assessed      Observation/Other Assessments   Observations  decreased femoral control with sit <> stand       Posture/Postural Control   Posture Comments  L side bend, bilateral scapular protraction, protracted neck, decreased bilateral hip extension      AROM   Overall AROM Comments  Performed in sitting    Lumbar Flexion  WFL    Lumbar Extension  limited    Lumbar - Right Side Bend  WFL    Lumbar - Left Side Bend  WFL with R low back pulling sensation    Lumbar - Right Rotation  limited    Lumbar - Left Rotation  limited      Strength   Overall Strength Comments  decreased femoral control with hip flexion    Right Hip Flexion  4-/5    Right Hip Extension  4/5   seated manually  resisted   Right Hip ABduction  4-/5   clamshell isometric   Left Hip Flexion  4-/5    Left Hip Extension  4/5   seated manually  resisted   Left Hip ABduction  4-/5   clamshell isometric   Right Knee Flexion  4-/5    Right Knee Extension  4/5    Left Knee Flexion  4/5    Left Knee Extension  4+/5      Palpation   Palpation comment  TTP L Low back around L5 area.  L lumbar paraspinal muscle tension       Ambulation/Gait   Gait Comments  with rw, antalgic, decreased stance L LE, decreased bilateral hip extension      Dynamic Gait Index   Level Surface  Mild Impairment    Change in Gait Speed  Moderate Impairment    Gait with Horizontal Head Turns  Moderate Impairment    Gait with Vertical Head Turns  Mild Impairment    Gait and Pivot Turn  Mild Impairment    Step Over Obstacle  Moderate Impairment    Step Around Obstacles  Normal    Steps  Moderate Impairment    Total Score  13    DGI comment:  performed with rw                Objective measurements completed on examination: See above findings.    Blood pressure R arm sitting, mechanically taken: 106/47, HR 81. No lightheadedness or dizziness  Pt states that her BP was 159 systolic 2 days ago at her cardiologist    Has a pacemaker  10 MWT 20 seconds (0.5 m/s)   Unable to obtain FOTO information at this time.   Patient is an 84 year old female who came to physical therapy secondary to impaired gait. She also presents with decreased femoral control, back pain, bilateral LE weakness, poor posture, decreased balance, and difficulty performing standing tasks. Patient will benefit from skilled physical therapy services to address the aforementioned deficits.       PT Education - 09/06/18 2135    Education provided  Yes    Education Details  plan of care    Person(s) Educated  Patient    Methods  Explanation;Demonstration;Tactile cues;Verbal cues    Comprehension  Returned demonstration;Verbalized understanding       PT Short Term Goals - 09/06/18 2136      PT SHORT TERM GOAL #1   Title  Patient will be independent with her HEP to  improve strength and balance.     Time  3    Period  Weeks    Status  New    Target Date  09/27/18        PT Long Term Goals - 09/06/18 1836      PT LONG TERM GOAL #1   Title  Patient will improve her 10 MWT to 0.8 m/s or more with rw to promote better community ambulation.     Baseline  0.5 seconds with rw (09/06/2018)    Time  8    Period  Weeks    Status  New    Target Date  11/01/18      PT LONG TERM GOAL #2   Title  Patient will improve her DGI score using rw or least restrictive AD to 19/24 or more to promote balance.     Baseline  13/24 using rw (09/06/2018)    Time  8    Period  Weeks    Status  New    Target Date  11/01/18      PT LONG TERM GOAL #3   Title  Patient will improve bilateral LE strength by at least 1/2 MMT to promote ability to support herself when performing standing tasks and improve balance.     Time  8    Period  Weeks    Status  New    Target Date  11/01/18      PT LONG TERM GOAL #4   Title  Patient will have a decrease in back pain/ache to 5/10 or less to promote ability to perform standing tasks with less difficulty.    Baseline  8/10 at most for the past 3 months (09/06/2018)    Time  8    Period  Weeks    Status  New    Target Date  11/01/18             Plan - 09/06/18 1824    Clinical Impression Statement  Patient is an 84 year old female who came to physical therapy secondary to impaired gait. She also presents with decreased femoral control, back pain, bilateral LE weakness, poor posture, decreased balance, and difficulty performing standing tasks. Patient will benefit from skilled physical therapy services to address the aforementioned deficits.     History and Personal Factors relevant to plan of care:  Chronicity of condition, back pain, weakness, multiple comorbidities, age    Clinical Presentation  Stable    Clinical Presentation due to:  pt has not fallen since February 2019 and currently using rw    Clinical Decision Making   Low    Rehab Potential  Fair    Clinical Impairments Affecting Rehab Potential  (-) chronicity of condition, age, comorbidities; (+) motivated    PT Frequency  2x / week    PT Duration  8 weeks    PT Treatment/Interventions  Aquatic Therapy;Gait training;Therapeutic activities;Therapeutic exercise;Balance training;Neuromuscular re-education;Patient/family education;Manual techniques;Dry needling    PT Next Visit Plan  trunk, hip, scapular strengthening, balance, gait, manual techniques PRN    Consulted and Agree with Plan of Care  Patient       Patient will benefit from skilled therapeutic intervention in order to improve the following deficits and impairments:  Abnormal gait, Decreased activity tolerance, Decreased balance, Decreased endurance, Decreased range of motion, Decreased strength, Difficulty walking, Improper body mechanics, Postural dysfunction, Pain, Decreased knowledge of use of DME  Visit Diagnosis: Unsteadiness on feet - Plan: PT plan of care cert/re-cert  History of falling - Plan: PT plan of care cert/re-cert  Muscle weakness (generalized) - Plan: PT plan of care cert/re-cert  Chronic low back pain, unspecified back pain laterality, with sciatica presence unspecified - Plan: PT plan of care cert/re-cert  Difficulty in walking, not elsewhere classified - Plan: PT plan of care cert/re-cert     Problem List Patient Active Problem List   Diagnosis Date Noted  . Septic olecranon bursitis of left elbow 03/27/2018  . Lymphedema 10/11/2017  . Chronic venous insufficiency 10/11/2017  . Leg pain 10/11/2017  . Leg swelling 10/11/2017  . COPD (chronic obstructive pulmonary disease) (HCC) 10/11/2017  . Essential hypertension 10/11/2017    Loralyn Freshwater PT, DPT   09/06/2018, 9:47 PM  [facility] SP[facility]TS [facility]IC[facility] [address], [place], [address] Phone: [phone]   Fax:  [phone]  Name: [name] MRN:  [id number] Date of Birth: 1930/03/13

## 2018-09-07 NOTE — Progress Notes (Signed)
Natasha, Barnett ([id number]) Visit Report for 09/05/2018 Arrival Information Details Patient Name: [name], [name]. Date of Service: 09/05/2018 11:15 AM Medical Record Number: [id number] Patient Account Number: [id number] Date of Birth/Sex: April 09, 1930 (84 y.o. F) Treating RN: Huel Coventry Primary Care Deya Bigos: Aram Beecham Other Clinician: Referring Javarius Tsosie: Aram Beecham Treating Yoland Scherr/Extender: Altamese [place] in Treatment: 73 Visit Information History Since Last Visit Added or deleted any medications: No Patient Arrived: Walker Any new allergies or adverse reactions: No Arrival Time: 11:16 Had a fall or experienced change in No Accompanied By: self activities of daily living that may affect Transfer Assistance: None risk of falls: Patient Identification Verified: Yes Signs or symptoms of abuse/neglect since last visito No Secondary Verification Process Yes Hospitalized since last visit: No Completed: Implantable device outside of the clinic excluding No Patient Requires Transmission-Based No cellular tissue based products placed in the center Precautions: since last visit: Patient Has Alerts: Yes Has Dressing in Place as Prescribed: Yes Patient Alerts: non compressible left Pain Present Now: No leg Electronic Signature(s) Signed: 09/05/2018 11:53:05 AM By: Dayton Martes RCP, RRT, CHT Entered By: Dayton Martes on 09/05/2018 11:20:45 [name] ([id number]) -------------------------------------------------------------------------------- Encounter Discharge Information Details Patient Name: [name] Date of Service: 09/05/2018 11:15 AM Medical Record Number: [id number] Patient Account Number: [id number] Date of Birth/Sex: 1930/06/05 (84 y.o. F) Treating RN: Curtis Sites Primary Care Nica Friske: Aram Beecham Other Clinician: Referring Janayla Marik: Aram Beecham Treating Jorrell Kuster/Extender: Altamese [place] in  Treatment: 72 Encounter Discharge Information Items Discharge Condition: Stable Ambulatory Status: Walker Discharge Destination: Home Transportation: Private Auto Accompanied By: self Schedule Follow-up Appointment: Yes Clinical Summary of Care: Post Procedure Vitals: Temperature (F): 98.1 Pulse (bpm): 63 Respiratory Rate (breaths/min): 16 Blood Pressure (mmHg): 113/45 Electronic Signature(s) Signed: 09/05/2018 11:57:55 AM By: Curtis Sites Entered By: Curtis Sites on 09/05/2018 11:57:55 [name] ([id number]) -------------------------------------------------------------------------------- Lower Extremity Assessment Details Patient Name: [name] Date of Service: 09/05/2018 11:15 AM Medical Record Number: [id number] Patient Account Number: [id number] Date of Birth/Sex: [birth year] (84 y.o. F) Treating RN: Renne Crigler Primary Care Kerry-Anne Mezo: Aram Beecham Other Clinician: Referring Ameri Cahoon: Aram Beecham Treating Akyia Borelli/Extender: Altamese [place] in Treatment: 42 Electronic Signature(s) Signed: 09/05/2018 4:06:29 PM By: Renne Crigler Entered By: Renne Crigler on 09/05/2018 11:29:54 [name] ([id number]) -------------------------------------------------------------------------------- Multi Wound Chart Details Patient Name: [name] Date of Service: 09/05/2018 11:15 AM Medical Record Number: [id number] Patient Account Number: [id number] Date of Birth/Sex: 08/08/1930 (84 y.o. F) Treating RN: Huel Coventry Primary Care Rupal Childress: Aram Beecham Other Clinician: Referring Emori Kamau: Aram Beecham Treating Dontrey Snell[place]ve/Extender: Altamese [place] in Treatment: 42 Vital Signs Height(in): 60 Pulse(bpm): 63 Weight(lbs): 123 Blood Pressure(mmHg): 113/45 Body Mass Index(BMI): 24 Temperature(F): 98.1 Respiratory Rate 18 (breaths/min): Photos: [9:No Photos] [N/A:N/A] Wound Location: [9:Left Elbow] [N/A:N/A] Wounding Event:  [9:Trauma] [N/A:N/A] Primary Etiology: [9:Trauma, Other] [N/A:N/A] Comorbid History: [9:Cataracts, Glaucoma, Optic Neuritis, Chronic sinus problems/congestion, Middle ear problems] [N/A:N/A] Date Acquired: [9:01/26/2018] [N/A:N/A] Weeks of Treatment: [9:18] [N/A:N/A] Wound Status: [9:Open] [N/A:N/A] Measurements L x W x D [9:0.6x0.5x0.3] [N/A:N/A] (cm) Area (cm) : [9:0.236] [N/A:N/A] Volume (cm) : [9:0.071] [N/A:N/A] % Reduction in Area: [9:-232.40%] [N/A:N/A] % Reduction in Volume: [9:-407.10%] [N/A:N/A] Starting Position 1 [9:5] (o'clock): Ending Position 1 [9:9] (o'clock): Maximum Distance 1 (cm): [9:0.5] Undermining: [9:Yes] [N/A:N/A] Classification: [9:Full Thickness With Exposed Support Structures] [N/A:N/A] Exudate Amount: [9:Small] [N/A:N/A] Exudate Type: [9:Purulent] [N/A:N/A] Exudate Color: [9:yellow, brown, green] [N/A:N/A] Wound Margin: [9:Distinct, outline attached] [N/A:N/A] Granulation Amount: [9:Small (1-33%)] [  N/A:N/A] Granulation Quality: [9:Red, Pink] [N/A:N/A] Necrotic Amount: [9:Large (67-100%)] [N/A:N/A] Necrotic Tissue: [9:Eschar, Adherent Slough] [N/A:N/A] Exposed Structures: [9:Fat Layer (Subcutaneous Tissue) Exposed: Yes Fascia: No Tendon: No] [N/A:N/A] Muscle: No Joint: No Bone: No Epithelialization: None N/A N/A Periwound Skin Texture: Callus: Yes N/A N/A Scarring: Yes Excoriation: No Induration: No Crepitus: No Rash: No Periwound Skin Moisture: Maceration: Yes N/A N/A Dry/Scaly: No Periwound Skin Color: Erythema: Yes N/A N/A Atrophie Blanche: No Cyanosis: No Ecchymosis: No Hemosiderin Staining: No Mottled: No Pallor: No Rubor: No Erythema Location: Circumferential N/A N/A Temperature: No Abnormality N/A N/A Tenderness on Palpation: Yes N/A N/A Wound Preparation: Ulcer Cleansing: N/A N/A Rinsed/Irrigated with Saline Topical Anesthetic Applied: Other: lidocaine 4% Procedures Performed: Cellular or Tissue Based N/A  N/A Product Treatment Notes Wound #9 (Left Elbow) 1. Cleansed with: Clean wound with Normal Saline 2. Anesthetic Topical Lidocaine 4% cream to wound bed prior to debridement 3. Peri-wound Care: Skin Prep 4. Dressing Applied: Other dressing (specify in notes) 5. Secondary Dressing Applied Bordered Foam Dressing Dry Gauze Notes grafix pl applied in clinic today, mepitel, steri strips, and bordered foam Electronic Signature(s) Signed: 09/05/2018 6:15:06 PM By: Baltazar Najjar MD Entered By: Baltazar Najjar on 09/05/2018 12:05:40 [name] ([id number]) -------------------------------------------------------------------------------- Multi-Disciplinary Care Plan Details Patient Name: [name] Date of Service: 09/05/2018 11:15 AM Medical Record Number: [id number] Patient Account Number: [id number] Date of Birth/Sex: July 04, 1930 (84 y.o. F) Treating RN: Huel Coventry Primary Care Londin Antone: Aram Beecham Other Clinician: Referring Kyerra Vargo: Aram Beecham Treating Twanisha Foulk/Extender: Altamese [place] in Treatment: 70 Active Inactive ` Soft Tissue Infection Nursing Diagnoses: Impaired tissue integrity Goals: Patient's soft tissue infection will resolve Date Initiated: 07/18/2018 Target Resolution Date: 07/27/2018 Goal Status: Active Interventions: Assess signs and symptoms of infection every visit Treatment Activities: Culture and sensitivity : 07/18/2018 Systemic antibiotics : 07/18/2018 Notes: ` Wound/Skin Impairment Nursing Diagnoses: Impaired tissue integrity Knowledge deficit related to ulceration/compromised skin integrity Goals: Patient/caregiver will verbalize understanding of skin care regimen Date Initiated: 11/14/2017 Target Resolution Date: 11/28/2017 Goal Status: Active Ulcer/skin breakdown will have a volume reduction of 30% by week 4 Date Initiated: 11/14/2017 Target Resolution Date: 11/28/2017 Goal Status: Active Interventions: Assess  patient/caregiver ability to obtain necessary supplies Assess patient/caregiver ability to perform ulcer/skin care regimen upon admission and as needed Assess ulceration(s) every visit Treatment Activities: Skin care regimen initiated : 11/14/2017 ANTWANETTE, WESCHE ([id number]) Notes: Electronic Signature(s) Signed: 09/05/2018 5:31:05 PM By: Elliot Gurney, BSN, RN, CWS, Kim RN, BSN Entered By: Elliot Gurney, BSN, RN, CWS, Kim on 09/05/2018 11:38:52 [name] ([id number]) -------------------------------------------------------------------------------- Pain Assessment Details Patient Name: [name] Date of Service: 09/05/2018 11:15 AM Medical Record Number: [id number] Patient Account Number: [id number] Date of Birth/Sex: 1930-02-26 (84 y.o. F) Treating RN: Huel Coventry Primary Care Donzel Romack: Aram Beecham Other Clinician: Referring Sydnie Sigmund: Aram Beecham Treating Sameeha Rockefeller/Extender: Altamese [place] in Treatment: 57 Active Problems Location of Pain Severity and Description of Pain Patient Has Paino No Site Locations Pain Management and Medication Current Pain Management: Electronic Signature(s) Signed: 09/05/2018 11:53:05 AM By: Dayton Martes RCP, RRT, CHT Signed: 09/05/2018 5:31:05 PM By: Elliot Gurney, BSN, RN, CWS, Kim RN, BSN Entered By: Dayton Martes on 09/05/2018 11:20:34 [name] ([id number]) -------------------------------------------------------------------------------- Patient/Caregiver Education Details Patient Name: [name] Date of Service: 09/05/2018 11:15 AM Medical Record Number: [id number] Patient Account Number: [id number] Date of Birth/Gender: [birth year] (84 y.o. F) Treating RN: Huel Coventry Primary Care Physician: Aram Beecham Other Clinician: Referring Physician: Aram Beecham Treating Physician/Extender:  ROBSON, [name] Weeks in Treatment: 73 Education Assessment Education Provided To: Patient Education Topics  Provided Wound/Skin Impairment: Handouts: Caring for Your Ulcer Methods: Demonstration, Explain/Verbal Responses: State content correctly Electronic Signature(s) Signed: 09/05/2018 5:31:05 PM By: Elliot Gurney, BSN, RN, CWS, Kim RN, BSN Entered By: Elliot Gurney, BSN, RN, CWS, Kim on 09/05/2018 11:49:51 [name] ([id number]) -------------------------------------------------------------------------------- Wound Assessment Details Patient Name: [name] Date of Service: 09/05/2018 11:15 AM Medical Record Number: [id number] Patient Account Number: [id number] Date of Birth/Sex: [birth year] (84 y.o. F) Treating RN: Renne Crigler Primary Care Ladye Macnaughton: Aram Beecham Other Clinician: Referring Seri Kimmer: Aram Beecham Treating Juno Alers/Extender: Altamese [place] in Treatment: 42 Wound Status Wound Number: 9 Primary Trauma, Other Etiology: Wound Location: Left Elbow Wound Open Wounding Event: Trauma Status: Date Acquired: 01/26/2018 Comorbid Cataracts, Glaucoma, Optic Neuritis, Chronic Weeks Of Treatment: 18 History: sinus problems/congestion, Middle ear problems Clustered Wound: No Photos Photo Uploaded By: Renne Crigler on 09/05/2018 16:19:32 Wound Measurements Length: (cm) 0.6 Width: (cm) 0.5 Depth: (cm) 0.3 Area: (cm) 0.236 Volume: (cm) 0.071 % Reduction in Area: -232.4% % Reduction in Volume: -407.1% Epithelialization: None Tunneling: No Undermining: Yes Starting Position (o'clock): 5 Ending Position (o'clock): 9 Maximum Distance: (cm) 0.5 Wound Description Full Thickness With Exposed Support Classification: Structures Wound Margin: Distinct, outline attached Exudate Small Amount: Exudate Type: Purulent Exudate Color: yellow, brown, green Foul Odor After Cleansing: No Slough/Fibrino Yes Wound Bed Granulation Amount: Small (1-33%) Exposed Structure Granulation Quality: Red, Pink Fascia Exposed: No Necrotic Amount: Large (67-100%) Fat Layer  (Subcutaneous Tissue) Exposed: Yes MARIADE, BUBIER ([id number]) Necrotic Quality: Eschar, Adherent Slough Tendon Exposed: No Muscle Exposed: No Joint Exposed: No Bone Exposed: No Periwound Skin Texture Texture Color No Abnormalities Noted: No No Abnormalities Noted: No Callus: Yes Atrophie Blanche: No Crepitus: No Cyanosis: No Excoriation: No Ecchymosis: No Induration: No Erythema: Yes Rash: No Erythema Location: Circumferential Scarring: Yes Hemosiderin Staining: No Mottled: No Moisture Pallor: No No Abnormalities Noted: No Rubor: No Dry / Scaly: No Maceration: Yes Temperature / Pain Temperature: No Abnormality Tenderness on Palpation: Yes Wound Preparation Ulcer Cleansing: Rinsed/Irrigated with Saline Topical Anesthetic Applied: Other: lidocaine 4%, Treatment Notes Wound #9 (Left Elbow) 1. Cleansed with: Clean wound with Normal Saline 2. Anesthetic Topical Lidocaine 4% cream to wound bed prior to debridement 3. Peri-wound Care: Skin Prep 4. Dressing Applied: Other dressing (specify in notes) 5. Secondary Dressing Applied Bordered Foam Dressing Dry Gauze Notes grafix pl applied in clinic today, mepitel, steri strips, and bordered foam Electronic Signature(s) Signed: 09/05/2018 4:06:29 PM By: Renne Crigler Entered By: Renne Crigler on 09/05/2018 11:29:44 [name] ([id number]) -------------------------------------------------------------------------------- Vitals Details Patient Name: [name] Date of Service: 09/05/2018 11:15 AM Medical Record Number: [id number] Patient Account Number: [id number] Date of Birth/Sex: March 24, 1930 (84 y.o. F) Treating RN: Huel Coventry Primary Care Shell Blanchette: Aram Beecham Other Clinician: Referring Shondell Fabel: Aram Beecham Treating Lore Polka/Extender: Altamese [place] in Treatment: 18 Vital Signs Time Taken: 11:20 Temperature (F): 98.1 Height (in): 60 Pulse (bpm): 63 Weight (lbs):  123 Respiratory Rate (breaths/min): 18 Body Mass Index (BMI): 24 Blood Pressure (mmHg): 113/45 Reference Range: 80 - 120 mg / dl Electronic Signature(s) Signed: 09/05/2018 11:53:05 AM By: Dayton Martes RCP, RRT, CHT Entered By: Dayton Martes on 09/05/2018 11:23:33

## 2018-09-07 NOTE — Progress Notes (Signed)
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 this is very slow coming about as far as any improvement is concerned. I do feel like there may be a little bit more granulation noted in the base of the wound but nothing too significant unfortunately. I still can probe bone in the proximal portion of the wound which obviously explain to the patient is not good. She will be having a follow-up with her orthopedic surgeon in the next couple of weeks. In the meantime we are trying to do as much as we can to try to show signs of improvement in healing to avoid the need for any additional and further surgery. Nonetheless I explained to the patient yet again today I'm not sure if that is going to be feasible or not obviously it's more risk for her to continue to have an open wound with bone exposure then to the back in for additional surgery even though I know she doesn't want to go that route. 05/15/18 on evaluation today patient presents for follow-up concerning her ongoing lower extremity ulcers on the left as well as the left elbow ulcer. She has at this point in time been tolerating the dressing changes without complication. Her left lower extremity ulcer appears to be doing very well. In regard to the left elbow ulcer she actually does seem to  have additional granulation today which is good news. I am definitely seeing signs of improvement although obviously this is somewhat slow improvement. Nonetheless I'm hopeful we will be able to avoid her having to have any further surgery but again that would definitely be a conversation between herself as well as  her surgeon once he sees her for reevaluation. Otherwise she does want to see about having a three order compression stockings for her today 05/21/18 on evaluation today patient appears to be doing well in regard to her left lower surety ulcer. This is almost completely healed and seems to be progressing very nicely. With that being said her left elbow is another story. I'm not really convinced in the past three weeks we've seen a significant improvement in this wound. With that being said if this is something that there is no surgical option for him we have to continue to work on this from the standpoint of conservative management with wound care she may make improvement given time. Nonetheless it appears that her surgeon is somewhat concerned about the possibility of infection and really is leaning towards additional surgery to try and help close this wound. Nonetheless the patient is still unsure of exactly what to do. 05/29/18 on evaluation today patient appears to be doing well in regard to her left lower extremity ulcer. She's been tolerating the dressing changes without complication which is good news. With that being said she's been having issues specifically with her elbow she did see her surgeon Dr. Joice Lofts and he is recommending a repeat surgery to the left elbow in order to correct the issue. The patient is still somewhat unsure of this but feels like this may be better than trying to take time to let this heal over a longer period of time through normal wound care measures. Again I explained that I agree this may be a faster way to go if her surgeon feels that this is indeed a  good direction to take. Obviously only he can make the judgment on whether or not the surgery would likely be successful. 06/04/18 on evaluation today patient actually presents for follow-up concerning her left elbow and left lower from the ulcer she seems to be doing very well at this point in time. She has been tolerating the dressing changes without complication. With that being said her elbow is not significantly better she actually is scheduled for surgery tomorrow. 07/04/18; the patient had an area on her left leg that is remaining closed. The open area she has now is a postsurgical wound on the left elbow. I think we have clearance from the surgeon to see this now. [address] [place] VMarland Kitchen ([id number]) 07/11/18; we're currently dealing with a surgical wound on the left olecranon process. The patient complains of a lot of pain and drainage. When I saw her last week we did an x-ray that showed soft tissue wound and probable elbow joint effusion but no erosion to suggest osteomyelitis. The culture I did of this was somewhat surprisingly negative. She has a small open wound with not a viable surface there is considerable undermining relative to the wound size. She is on methotrexate for rheumatoid arthritis/overlap syndrome also plaquenil. We've been using silver collagen 07/18/18-She is seen in follow-up evaluation for a left elbow wound. There is essentially no change. She is currently on Zithromax and will complete that on Friday, there is no indication to extend this. We will change to iodosorb/iodoflex and monitor for response 07/25/18-She is seen in follow-up evaluation for left elbow wound. The wound is stable with no overt evidence of infection. She has counseled with her rheumatologist. She is wanting to restart her methotrexate; a culture was obtained to rule out occult infection before starting her methotrexate. We will continue with Iodosorb/Iodoflex and she will  follow-up next  week. 08/01/18; this is a difficult wound over her left olecranon process. There is been concerned about infection although cultures including one done last week were negative. Pending 3 weeks ago I gave her an empiric course of antibiotics. She is having a lot of rheumatologic pain in her hands with pain and stiffness. She wants to go on her weekly methotrexate and I think it would be reasonable to do so. We have been using Iodoflex 08/08/18; difficult wound over her left olecranon process. She started back on methotrexate last week because of rheumatologic pain in her hands. We have been using Iodoflex to try and clean out the wound bed. She has been approved for [employer] Objective Constitutional Sitting or standing Blood Pressure is within target range for patient.. Pulse regular and within target range for patient.Marland Kitchen Respirations regular, non-labored and within target range.. Temperature is normal and within the target range for the patient.Marland Kitchen appears in no distress. Vitals Time Taken: 3:55 PM, Height: 60 in, Weight: 123 lbs, BMI: 24, Temperature: 98.2 F, Pulse: 79 bpm, Respiratory Rate: 18 breaths/min, Blood Pressure: 123/53 mmHg. Eyes Conjunctivae clear. No discharge. Respiratory Respiratory effort is easy and symmetric bilaterally. Rate is normal at rest and on room air.Marland Kitchen Psychiatric No evidence of depression, anxiety, or agitation. Calm, cooperative, and communicative. Appropriate interactions and affect.. General Notes: wound exam; small opening however still some undermining especially inferiorly. The undermining areas about a centimeter. There is no evidence of surrounding infection no erythema. There is no palpable bone. Her radial pulses are palpable She does not have evidence of epitrochlear or axillary adenopathy. Integumentary (Hair, Skin) no primary skin disorder is seen. Wound #9 status is Open. Original cause of wound was Trauma. The wound is located on the Left Elbow.  The wound measures 0.5cm length x 0.3cm width x 0.2cm depth; 0.118cm^2 area and 0.024cm^3 volume. There is bone and Fat Layer (Subcutaneous Tissue) Exposed exposed. There is no tunneling noted, however, there is undermining starting at 1:00 and ending at 1:00 with a maximum distance of 0.8cm. There is a large amount of serous drainage noted. The wound margin is distinct with the outline attached to the wound base. There is small (1-33%) pink granulation within the wound bed. There is a [name], [name] ([id number]) large (67-100%) amount of necrotic tissue within the wound bed including Eschar and Adherent Slough. The periwound skin appearance exhibited: Callus, Scarring, Erythema. The periwound skin appearance did not exhibit: Crepitus, Excoriation, Induration, Rash, Dry/Scaly, Maceration, Atrophie Blanche, Cyanosis, Ecchymosis, Hemosiderin Staining, Mottled, Pallor, Rubor. The surrounding wound skin color is noted with erythema which is circumferential. Periwound temperature was noted as No Abnormality. The periwound has tenderness on palpation. 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 Anesthetic (add to Medication List): Wound #9 Left Elbow: Topical Lidocaine 4% cream applied to wound bed prior to debridement (In Clinic Only). Primary Wound Dressing: Wound #9 Left Elbow: Iodoflex Secondary Dressing: Wound #9 Left Elbow: Boardered Foam Dressing Dressing Change Frequency: Wound #9 Left Elbow: Change Dressing Monday, Wednesday, Friday - clean out left over dressing with saline and q-tip before applying new dressing. Follow-up Appointments: Wound #9 Left Elbow: Return Appointment in 1 week. Off-Loading: Wound #9 Left Elbow: Other: - Keep pressure off of  elbow. General Notes: Order Grafix 16mm #1 continue with Iodoflex  for this week #2 likely to require a debridement with a #3 curet next week [name], [name] ([id number]) #3 graphics P I'll plan for next week. We'll have to see if we can stimulate some granulation and adhesion with the skin at the circumference of the wound Electronic Signature(s) Signed: 09/05/2018 8:07:52 AM By: Elliot Gurney, BSN, RN, CWS, Kim RN, BSN Signed: 09/05/2018 6:15:06 PM By: Baltazar Najjar MD Previous Signature: 08/08/2018 5:12:47 PM Version By: Baltazar Najjar MD Entered By: Elliot Gurney, BSN, RN, CWS, Kim on 09/05/2018 08:07:52 [name] ([id number]) -------------------------------------------------------------------------------- SuperBill Details Patient Name: [name] Date of Service: 08/08/2018 Medical Record Number: [id number] Patient Account Number: [id number] Date of Birth/Sex: [birth year] (84 y.o. 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

## 2018-09-07 NOTE — Progress Notes (Signed)
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 this was performed roughly 4 weeks ago according to the note that I reviewed. She has been placed on Bactrim DS by need for her leg wounds this probably helped a little bit with the left elbow surgery site. Obviously I do think this is something we can try to help her out with. 05/04/18 on evaluation today patient appears to be doing well in regard to her left anterior lower Trinity ulcers. She is making good progress which is great news. Unfortunately her elbow which we are also managing at this point in time has not made as much progress unfortunately. She has been tolerating the dressing changes without complication. She did see Dr. Darleen Crocker earlier today and he states that he's willing to give this three weeks to see if she's making any progress with wound care. However he states that she's really not then he will need to go back in and perform further surgery. Obviously she is trying to avoid surgery if at all possible although I'm not sure if this is going to be possible or least not that quickly. 05/11/18 on evaluation today patient appears to be doing very well in regard to her left lower extremity ulcers. Unfortunately in regard to  her elbow this is very slow coming about as far as any improvement is concerned. I do feel like there may be a little bit more granulation noted in the base of the wound but nothing too significant unfortunately. I still can probe bone in the proximal portion of the wound which obviously explain to the patient is not good. She will be having a follow-up with her orthopedic surgeon in the next couple of weeks. In the meantime we are trying to do as much as we can to try to show signs of improvement in healing to avoid the need  for any additional and further surgery. Nonetheless I explained to the patient yet again today I'm not sure if that is going to be feasible or not obviously it's more risk for her to continue to have an open wound with bone exposure then to the back in for additional surgery even though I know she doesn't want to go that route. 05/15/18 on evaluation today patient presents for follow-up concerning her ongoing lower extremity ulcers on the left as well as the left elbow ulcer. She has at this point in time been tolerating the dressing changes without complication. Her left lower extremity ulcer appears to be doing very well. In regard to the left elbow ulcer she actually does seem to have additional granulation today which is good news. I am definitely seeing signs of improvement although obviously this is somewhat slow improvement. Nonetheless I'm hopeful we will be able to avoid her having to have any further surgery but again that would definitely be a conversation between herself as well as her surgeon once he sees her for reevaluation. Otherwise she does want to see about having a three order compression stockings for her today 05/21/18 on evaluation today patient appears to be doing well in regard to her left lower surety ulcer. This is almost completely healed and seems to be progressing very nicely. With that being said her left elbow is another story. I'm not really  convinced in the past three weeks we've seen a significant improvement in this wound. With that being said if this is something that there is no surgical option for him we have to continue to work on this from the standpoint of conservative management with wound care she may make improvement given time. Nonetheless it appears that her surgeon is somewhat concerned about the possibility of infection and really is leaning towards additional surgery to try and help close this wound. Nonetheless the patient is still unsure of exactly what to do. 05/29/18 on evaluation today patient appears to be doing well in regard to her left lower extremity ulcer. She's been tolerating the dressing changes without complication which is good news. With that being said she's been having issues specifically with her elbow she did see her surgeon Dr. Joice Lofts and he is recommending a repeat surgery to the left elbow in order to correct the issue. The patient is still somewhat unsure of this but feels like this may be better than trying to take time to let this heal over a longer period of time through normal wound care measures. Again I explained that I agree this may be a faster way to go if her surgeon feels that this is indeed a good direction to take. Obviously only he can make the judgment on whether or not the surgery would likely be successful. 06/04/18 on evaluation today patient actually presents for follow-up concerning her left elbow and left lower from the ulcer she seems to be doing very well at this point in time. She has been tolerating the dressing changes without complication. With that being said her elbow is not significantly better she actually is scheduled for surgery tomorrow. 07/04/18; the patient had an area on her left leg that is remaining closed. The open area she has now is a postsurgical wound on the left elbow. I think we have clearance from the surgeon to see this now. [address],  Beatty VMarland Kitchen ([id number]) 07/11/18; we're currently dealing with a surgical wound on the left olecranon process. The patient complains  of a lot of pain and drainage. When I saw her last week we did an x-ray that showed soft tissue wound and probable elbow joint effusion but no erosion to suggest osteomyelitis. The culture I did of this was somewhat surprisingly negative. She has a small open wound with not a viable surface there is considerable undermining relative to the wound size. She is on methotrexate for rheumatoid arthritis/overlap syndrome also plaquenil. We've been using silver collagen 07/18/18-She is seen in follow-up evaluation for a left elbow wound. There is essentially no change. She is currently on Zithromax and will complete that on Friday, there is no indication to extend this. We will change to iodosorb/iodoflex and monitor for response 07/25/18-She is seen in follow-up evaluation for left elbow wound. The wound is stable with no overt evidence of infection. She has counseled with her rheumatologist. She is wanting to restart her methotrexate; a culture was obtained to rule out occult infection before starting her methotrexate. We will continue with Iodosorb/Iodoflex and she will follow-up next week. 08/01/18; this is a difficult wound over her left olecranon process. There is been concerned about infection although cultures including one done last week were negative. Pending 3 weeks ago I gave her an empiric course of antibiotics. She is having a lot of rheumatologic pain in her hands with pain and stiffness. She wants to go on her weekly methotrexate and I think it would be reasonable to do so. We have been using Iodoflex 08/01/18; difficult wound over her left olecranon process. She started back on methotrexate last week because of rheumatologic pain in her hands. We have been using Iodoflex to try and clean out the wound bed. She has been approved for Graphix PL 08/15/18; 2 week  follow-up. Difficult wound over her left olecranon process. Graphix PL #1 with collagen backing 08/22/18; one-week follow-up. Difficult wound over her left olecranon process. Graphix PL #2 08/29/18; no major improvement. Difficult wound over her left olecranon process. Still considerable undermining. Graphics PL #3 1 week follow-up. Graphics #4 Objective Constitutional Vitals Time Taken: 11:20 AM, Height: 60 in, Weight: 123 lbs, BMI: 24, Temperature: 98.1 F, Pulse: 63 bpm, Respiratory Rate: 18 breaths/min, Blood Pressure: 113/45 mmHg. Integumentary (Hair, Skin) Wound #9 status is Open. Original cause of wound was Trauma. The wound is located on the Left Elbow. The wound measures 0.6cm length x 0.5cm width x 0.3cm depth; 0.236cm^2 area and 0.071cm^3 volume. There is Fat Layer (Subcutaneous Tissue) Exposed exposed. There is no tunneling noted, however, there is undermining starting at 5:00 and ending at 9:00 with a maximum distance of 0.5cm. There is a small amount of purulent drainage noted. The wound margin is distinct with the outline attached to the wound base. There is small (1-33%) red, pink granulation within the wound bed. There is a large (67-100%) amount of necrotic tissue within the wound bed including Eschar and Adherent Slough. The periwound skin appearance exhibited: Callus, Scarring, Maceration, Erythema. The periwound skin appearance did not exhibit: Crepitus, Excoriation, Induration, Rash, Dry/Scaly, Atrophie Blanche, Cyanosis, Ecchymosis, Hemosiderin Staining, Mottled, Pallor, Rubor. The surrounding wound skin color is noted with erythema which is circumferential. Periwound temperature was noted as No Abnormality. The periwound has tenderness on palpation. 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  A skin graft procedure using a bioengineered skin substitute/cellular or tissue based product was performed by Maxwell Caul, MD with the following instrument(s): Forceps. Other was applied and secured with Steri-Strips. 3 sq cm of product was utilized and 0 sq cm was wasted. Post Application, mepitel one was applied. A Time Out was conducted at 11:35, prior to the start of the procedure. The procedure was tolerated well with a pain level of 0 throughout and a pain level of 0 following the procedure. Post procedure Diagnosis Wound #9: Same as Pre-Procedure . Plan Wound Cleansing: Wound #9 Left Elbow: Clean wound with Normal Saline. Anesthetic (add to Medication List): Wound #9 Left Elbow: Topical Lidocaine 4% cream applied to wound bed prior to debridement (In Clinic Only). Skin Barriers/Peri-Wound Care: Wound #9 Left Elbow: Skin Prep Primary Wound Dressing: Other: - Endoform in wound and in undermining Dressing Change Frequency: Wound #9 Left Elbow: Change dressing every week - may change outer dressing if it gets soiled or wet Follow-up Appointments: Wound #9 Left Elbow: Return Appointment in 1 week. Advanced Therapies: Wound #9 Left Elbow: Grafix PL application in clinic; including contact layer, fixation with steri strips, dry gauze and cover dressing. #1 graphics PL #4 #2 unfortunately very little progress here. Electronic Signature(s) Signed: 09/05/2018 6:15:06 PM By: Baltazar Najjar MD [name] ([id number]) Entered By: Baltazar Najjar on 09/05/2018 12:11:06 [name] ([id number]) -------------------------------------------------------------------------------- SuperBill Details Patient Name: [name] Date of Service: 09/05/2018 Medical Record Number: [id number] Patient Account Number: [id number] Date of Birth/Sex: [birth year] (84 y.o.  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

## 2018-09-11 ENCOUNTER — Ambulatory Visit: Payer: Medicare Other

## 2018-09-11 ENCOUNTER — Ambulatory Visit: Payer: Medicare Other | Attending: Neurology

## 2018-09-11 VITALS — BP 125/58 | HR 84

## 2018-09-11 DIAGNOSIS — M545 Low back pain: Secondary | ICD-10-CM | POA: Insufficient documentation

## 2018-09-11 DIAGNOSIS — R262 Difficulty in walking, not elsewhere classified: Secondary | ICD-10-CM | POA: Diagnosis present

## 2018-09-11 DIAGNOSIS — M6281 Muscle weakness (generalized): Secondary | ICD-10-CM

## 2018-09-11 DIAGNOSIS — Z9181 History of falling: Secondary | ICD-10-CM | POA: Insufficient documentation

## 2018-09-11 DIAGNOSIS — G8929 Other chronic pain: Secondary | ICD-10-CM | POA: Insufficient documentation

## 2018-09-11 DIAGNOSIS — R2681 Unsteadiness on feet: Secondary | ICD-10-CM | POA: Diagnosis not present

## 2018-09-11 NOTE — Therapy (Signed)
[place] [facility] [address], [place], [address] Phone: [phone]   Fax:  [phone]  Physical Therapy Treatment  Patient Details  Name: [name] MRN: [id number] Date of Birth: 06-18-1930 Referring Provider (PT): Cristopher Peru, MD   Encounter Date: 09/11/2018  PT End of Session - 09/11/18 1405    Visit Number  2    Number of Visits  17    Date for PT Re-Evaluation  11/01/18    Authorization Type  2    Authorization Time Period  of 10 progress report    PT Start Time  1403    PT Stop Time  1430    PT Time Calculation (min)  27 min    Equipment Utilized During Treatment  Gait belt    Activity Tolerance  Patient tolerated treatment well    Behavior During Therapy  WFL for tasks assessed/performed       Past Medical History:  Diagnosis Date  . Anemia   . GERD (gastroesophageal reflux disease)   . Hypertension   . Peripheral vascular disease (HCC)    possible neuropathies in lower extremeties  . PONV (postoperative nausea and vomiting)    happens sometimes but better with pre med of zofran  . Presence of permanent cardiac pacemaker   . Syncope     Past Surgical History:  Procedure Laterality Date  . ABDOMINAL HYSTERECTOMY  1969  . APPENDECTOMY  1969   with hysterectomy  . BACK SURGERY  2001   rods in back. surgery on back x 3  . COLONOSCOPY    . I&D EXTREMITY Left 03/27/2018   Procedure: IRRIGATION AND DEBRIDEMENT LEFT ELBOW / OLECRANON BURSA;  Surgeon: Christena Flake, MD;  Location: [facility] ORS;  Service: Orthopedics;  Laterality: Left;  . I&D EXTREMITY Left 06/05/2018   Procedure: IRRIGATION AND DEBRIDEMENT EXTREMITY;  Surgeon: Christena Flake, MD;  Location: [facility] ORS;  Service: Orthopedics;  Laterality: Left;  . INSERT / REPLACE / REMOVE PACEMAKER  2004  . JOINT REPLACEMENT Left 2004   partial knee replacement  . OLECRANON BURSECTOMY Left 03/27/2018   Procedure: LEFT OLECRANON BURSA;  Surgeon:  Christena Flake, MD;  Location: [facility] ORS;  Service: Orthopedics;  Laterality: Left;  . PACEMAKER INSERTION  2014   dual chamber for complete heart block    Vitals:   09/11/18 1402  BP: (!) 125/58  Pulse: 84  SpO2: 99%    Subjective Assessment - 09/11/18 1403    Subjective  Pt reports that she is doing well on this date. She denies back pain upon arrival. No specific questions or concerns at this time.     Pertinent History  Imparied gait. Pt states that she has had balance issues for too long. Feels more comfortable walking with her rw. Had tests which showed that she has neuropathy. However the doctor at [facility] said that her balance is coming from her back. Just wants to be strong so she can walk.  Started walking with her rw off and on for the past several months.  Also feels like she might need a new sleep number bed. Also had 2 wound surgeries at [name]. Pt fell in February 2019. Had to stop PT due to the L elbow surgery April 06, 2018 to remove a bursa. The wound still has not healed.   Currently sees a wound doctor.   Feels numbness L foot from the ankle down.  No  R foot numbness.  Sometimes unsure of where her L LE is in space.  Pt also states that she tires too quickly.  Has not been able to walk her dog in 2 years. Has not really been doing exercises at home except the sit <> stand. Pt states that prolonged sitting without back support will increase back pain     Limitations  Standing;Walking    How long can you sit comfortably?  1 hr    How long can you stand comfortably?  10 minutes    How long can you walk comfortably?  2 or 3 blocks    Diagnostic tests  EMG testing BLEs and BUEs which were negative    Patient Stated Goals  Improve LE strength. Wants to use the machines at the gym. Walk more steadily. Be able to take her dog (16 lbs) out on a leash.     Currently in Pain?  No/denies           TREATMENT  Ther-ex  NuStep L1 x 5 minutes for warm-up during history; Sit  to stand from regular height chair with Airex pad on seat without UE support 2 x 10; Standing mini squat 2 x 10; Standing slow marches without UE support 2 x 10; Standing heel raises without 2 finger assist 2 x 10; Step-up/down to 4" step alternating LE x 10 each;  Pt educated throughout session about proper posture and technique with exercises. Improved exercise technique, movement at target joints, use of target muscles after min to mod verbal, visual, tactile cues.    Pt arrived approximately 15 minutes late and then required additional time to get back to treatment area. She is able to perform all exercises as therapist instructs with good motivation. Pt encouraged to continue HEP and follow-up as scheduled. Patient will benefit from skilled physical therapy services to address the aforementioned deficits.                     PT Short Term Goals - 09/06/18 2136      PT SHORT TERM GOAL #1   Title  Patient will be independent with her HEP to improve strength and balance.     Time  3    Period  Weeks    Status  New    Target Date  09/27/18        PT Long Term Goals - 09/06/18 1836      PT LONG TERM GOAL #1   Title  Patient will improve her 10 MWT to 0.8 m/s or more with rw to promote better community ambulation.     Baseline  0.5 seconds with rw (09/06/2018)    Time  8    Period  Weeks    Status  New    Target Date  11/01/18      PT LONG TERM GOAL #2   Title  Patient will improve her DGI score using rw or least restrictive AD to 19/24 or more to promote balance.     Baseline  13/24 using rw (09/06/2018)    Time  8    Period  Weeks    Status  New    Target Date  11/01/18      PT LONG TERM GOAL #3   Title  Patient will improve bilateral LE strength by at least 1/2 MMT to promote ability to support herself when performing standing tasks and improve balance.     Time  8  Period  Weeks    Status  New    Target Date  11/01/18      PT LONG TERM GOAL #4    Title  Patient will have a decrease in back pain/ache to 5/10 or less to promote ability to perform standing tasks with less difficulty.    Baseline  8/10 at most for the past 3 months (09/06/2018)    Time  8    Period  Weeks    Status  New    Target Date  11/01/18            Plan - 09/11/18 1406    Clinical Impression Statement  Pt arrived approximately 15 minutes late and then required additional time to get back to treatment area. She is able to perform all exercises as therapist instructs with good motivation. Pt encouraged to continue HEP and follow-up as scheduled. Patient will benefit from skilled physical therapy services to address the aforementioned deficits.    Rehab Potential  Fair    Clinical Impairments Affecting Rehab Potential  (-) chronicity of condition, age, comorbidities; (+) motivated    PT Frequency  2x / week    PT Duration  8 weeks    PT Treatment/Interventions  Aquatic Therapy;Gait training;Therapeutic activities;Therapeutic exercise;Balance training;Neuromuscular re-education;Patient/family education;Manual techniques;Dry needling    PT Next Visit Plan  trunk, hip, scapular strengthening, balance, gait, manual techniques PRN    Consulted and Agree with Plan of Care  Patient       Patient will benefit from skilled therapeutic intervention in order to improve the following deficits and impairments:  Abnormal gait, Decreased activity tolerance, Decreased balance, Decreased endurance, Decreased range of motion, Decreased strength, Difficulty walking, Improper body mechanics, Postural dysfunction, Pain, Decreased knowledge of use of DME  Visit Diagnosis: Unsteadiness on feet  History of falling  Muscle weakness (generalized)     Problem List Patient Active Problem List   Diagnosis Date Noted  . Septic olecranon bursitis of left elbow 03/27/2018  . Lymphedema 10/11/2017  . Chronic venous insufficiency 10/11/2017  . Leg pain 10/11/2017  . Leg swelling  10/11/2017  . COPD (chronic obstructive pulmonary disease) (HCC) 10/11/2017  . Essential hypertension 10/11/2017    Rosela Supak 09/12/2018, 10:34 AM  [place] [facility] [address], [place], [address] Phone: [phone]   Fax:  [phone]  Name: ANALICE HOLTZ MRN: [id number] Date of Birth: 1930-07-05

## 2018-09-12 ENCOUNTER — Encounter: Payer: Medicare Other | Attending: Internal Medicine | Admitting: Internal Medicine

## 2018-09-12 DIAGNOSIS — X58XXXA Exposure to other specified factors, initial encounter: Secondary | ICD-10-CM | POA: Diagnosis not present

## 2018-09-12 DIAGNOSIS — M069 Rheumatoid arthritis, unspecified: Secondary | ICD-10-CM | POA: Insufficient documentation

## 2018-09-12 DIAGNOSIS — Z950 Presence of cardiac pacemaker: Secondary | ICD-10-CM | POA: Insufficient documentation

## 2018-09-12 DIAGNOSIS — L98495 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: Secondary | ICD-10-CM | POA: Insufficient documentation

## 2018-09-12 DIAGNOSIS — S51002A Unspecified open wound of left elbow, initial encounter: Secondary | ICD-10-CM | POA: Diagnosis present

## 2018-09-13 ENCOUNTER — Encounter: Payer: Self-pay | Admitting: Physical Therapy

## 2018-09-13 ENCOUNTER — Ambulatory Visit: Payer: Medicare Other

## 2018-09-13 ENCOUNTER — Ambulatory Visit: Payer: Medicare Other | Admitting: Physical Therapy

## 2018-09-13 DIAGNOSIS — R2681 Unsteadiness on feet: Secondary | ICD-10-CM | POA: Diagnosis not present

## 2018-09-13 NOTE — Therapy (Signed)
[facility]ICINE [address], [place], [address] Phone: [phone]   Fax:  [phone]  Physical Therapy Treatment  Patient Details  Name: [name] MRN: [id number] Date of Birth: 27-Nov-1930 Referring Provider (PT): Cristopher Peru, MD   Encounter Date: 09/13/2018  PT End of Session - 09/13/18 1525    Visit Number  3    Number of Visits  17    Date for PT Re-Evaluation  11/01/18    Authorization Type  3    Authorization Time Period  of 10 progress report    PT Start Time  0315    PT Stop Time  0400    PT Time Calculation (min)  45 min    Equipment Utilized During Treatment  Gait belt    Activity Tolerance  Patient tolerated treatment well    Behavior During Therapy  [facility] for tasks assessed/performed       Past Medical History:  Diagnosis Date  . Anemia   . GERD (gastroesophageal reflux disease)   . Hypertension   . Peripheral vascular disease (HCC)    possible neuropathies in lower extremeties  . PONV (postoperative nausea and vomiting)    happens sometimes but better with pre med of zofran  . Presence of permanent cardiac pacemaker   . Syncope     Past Surgical History:  Procedure Laterality Date  . ABDOMINAL HYSTERECTOMY  1969  . APPENDECTOMY  1969   with hysterectomy  . BACK SURGERY  2001   rods in back. surgery on back x 3  . COLONOSCOPY    . I&D EXTREMITY Left 03/27/2018   Procedure: IRRIGATION AND DEBRIDEMENT LEFT ELBOW / OLECRANON BURSA;  Surgeon: Christena Flake, MD;  Location: [facility] ORS;  Service: Orthopedics;  Laterality: Left;  . I&D EXTREMITY Left 06/05/2018   Procedure: IRRIGATION AND DEBRIDEMENT EXTREMITY;  Surgeon: Christena Flake, MD;  Location: [facility] ORS;  Service: Orthopedics;  Laterality: Left;  . INSERT / REPLACE / REMOVE PACEMAKER  2004  . JOINT REPLACEMENT Left 2004   partial knee replacement  . OLECRANON BURSECTOMY Left 03/27/2018   Procedure: LEFT OLECRANON BURSA;  Surgeon:  Christena Flake, MD;  Location: [facility] ORS;  Service: Orthopedics;  Laterality: Left;  . PACEMAKER INSERTION  2014   dual chamber for complete heart block    There were no vitals filed for this visit.  Subjective Assessment - 09/13/18 1522    Subjective  Pt reports she is doing well today. Patient reports she went to the wound center yesterday and that she is anticipating a skin graft surgery for her elbow wound. Patient reports no other pain at this time. Reports compliance with HEP with no questions or concerns.     Pertinent History  Imparied gait. Pt states that she has had balance issues for too long. Feels more comfortable walking with her rw. Had tests which showed that she has neuropathy. However the doctor at [facility] said that her balance is coming from her back. Just wants to be strong so she can walk.  Started walking with her rw off and on for the past several months.  Also feels like she might need a new sleep number bed. Also had 2 wound surgeries at [name]. Pt fell in February 2019. Had to stop PT due to the L elbow surgery April 06, 2018 to remove a bursa. The wound still has not healed.   Currently sees a wound  doctor.   Feels numbness L foot from the ankle down.  No R foot numbness.  Sometimes unsure of where her L LE is in space.  Pt also states that she tires too quickly.  Has not been able to walk her dog in 2 years. Has not really been doing exercises at home except the sit <> stand. Pt states that prolonged sitting without back support will increase back pain     Limitations  Standing;Walking    How long can you sit comfortably?  1 hr    How long can you stand comfortably?  10 minutes    How long can you walk comfortably?  2 or 3 blocks    Diagnostic tests  EMG testing BLEs and BUEs which were negative    Patient Stated Goals  Improve LE strength. Wants to use the machines at the gym. Walk more steadily. Be able to take her dog (16 lbs) out on a leash.     Pain Onset  More  than a month ago       Ther-ex  - NuStep LLE L1 x 5 minutes aerobic exercise in safe setting; - Mini squat with treadmill bar in front of patient and chair behind for safety 3x 10 with min cuing for proper knee positioning - Standing marches without UE support 3 x 10 each with min cuing for proper knee height - Sidestepping over 5in cane obstacle with 1 UE support on handrail and CGA from PT 3x 8 (over and back = 1 rep) - Stepping forward and backward over 5in cane obstacle with bilat UE support on handrail 3x 8 (over and back = 1 rep) CGA with occasional minA needed to prevent LOB                         PT Education - 09/13/18 1524    Education provided  Yes    Education Details  Exercise form/techniques    Person(s) Educated  Patient    Methods  Explanation;Demonstration;Verbal cues    Comprehension  Verbalized understanding;Returned demonstration;Verbal cues required       PT Short Term Goals - 09/06/18 2136      PT SHORT TERM GOAL #1   Title  Patient will be independent with her HEP to improve strength and balance.     Time  3    Period  Weeks    Status  New    Target Date  09/27/18        PT Long Term Goals - 09/06/18 1836      PT LONG TERM GOAL #1   Title  Patient will improve her 10 MWT to 0.8 m/s or more with rw to promote better community ambulation.     Baseline  0.5 seconds with rw (09/06/2018)    Time  8    Period  Weeks    Status  New    Target Date  11/01/18      PT LONG TERM GOAL #2   Title  Patient will improve her DGI score using rw or least restrictive AD to 19/24 or more to promote balance.     Baseline  13/24 using rw (09/06/2018)    Time  8    Period  Weeks    Status  New    Target Date  11/01/18      PT LONG TERM GOAL #3   Title  Patient will improve bilateral LE strength by at least  1/2 MMT to promote ability to support herself when performing standing tasks and improve balance.     Time  8    Period  Weeks    Status   New    Target Date  11/01/18      PT LONG TERM GOAL #4   Title  Patient will have a decrease in back pain/ache to 5/10 or less to promote ability to perform standing tasks with less difficulty.    Baseline  8/10 at most for the past 3 months (09/06/2018)    Time  8    Period  Weeks    Status  New    Target Date  11/01/18            Plan - 09/13/18 1607    Clinical Impression Statement  PT was able to progress balance demand with therex  with patient requiring assist from PT for safety. Patient is able to complete therex with accuracy following cuing from PT and is very motivated throughout session. Patietn does require seated rest breaks between sets, but is able to complete all reps of therex successfully.     Rehab Potential  Fair    Clinical Impairments Affecting Rehab Potential  (-) chronicity of condition, age, comorbidities; (+) motivated    PT Frequency  2x / week    PT Duration  8 weeks    PT Treatment/Interventions  Aquatic Therapy;Gait training;Therapeutic activities;Therapeutic exercise;Balance training;Neuromuscular re-education;Patient/family education;Manual techniques;Dry needling    PT Next Visit Plan  trunk, hip, scapular strengthening, balance, gait, manual techniques PRN    PT Home Exercise Plan  Sit<>stand, standing hip ext and abd, bridging, and heel raises    Consulted and Agree with Plan of Care  Patient       Patient will benefit from skilled therapeutic intervention in order to improve the following deficits and impairments:  Abnormal gait, Decreased activity tolerance, Decreased balance, Decreased endurance, Decreased range of motion, Decreased strength, Difficulty walking, Improper body mechanics, Postural dysfunction, Pain, Decreased knowledge of use of DME  Visit Diagnosis: Unsteadiness on feet     Problem List Patient Active Problem List   Diagnosis Date Noted  . Septic olecranon bursitis of left elbow 03/27/2018  . Lymphedema 10/11/2017  .  Chronic venous insufficiency 10/11/2017  . Leg pain 10/11/2017  . Leg swelling 10/11/2017  . COPD (chronic obstructive pulmonary disease) (HCC) 10/11/2017  . Essential hypertension 10/11/2017   Staci Acosta PT, DPT Staci Acosta 09/13/2018, 5:22 PM  [facility] PHYSICAL AND SPORTS MEDICINE [address] [place], [address] Phone: [phone]   Fax:  [phone]  Name: [name] MRN: [id number] Date of Birth: [birth year]

## 2018-09-15 NOTE — Progress Notes (Signed)
[name], [name] ([id number]) Visit Report for 09/12/2018 Arrival Information Details Patient Name: [name], [name]. Date of Service: 09/12/2018 11:15 AM Medical Record Number: [id number] Patient Account Number: [id number] Date of Birth/Sex: [birth year] (84 y.o. F) Treating RN: Huel Coventry Primary Care Jadene Stemmer: Aram Beecham Other Clinician: Referring Imanie Darrow: Aram Beecham Treating Tishina Lown/Extender: Altamese [place] in Treatment: 72 Visit Information History Since Last Visit Added or deleted any medications: No Patient Arrived: Walker Any new allergies or adverse reactions: No Arrival Time: 11:20 Had a fall or experienced change in No Accompanied By: self activities of daily living that may affect Transfer Assistance: None risk of falls: Patient Identification Verified: Yes Signs or symptoms of abuse/neglect since last visito No Secondary Verification Process Yes Hospitalized since last visit: No Completed: Implantable device outside of the clinic excluding No Patient Requires Transmission-Based No cellular tissue based products placed in the center Precautions: since last visit: Patient Has Alerts: Yes Has Dressing in Place as Prescribed: Yes Patient Alerts: non compressible left Pain Present Now: No leg Electronic Signature(s) Signed: 09/12/2018 1:31:10 PM By: Dayton Martes RCP, RRT, CHT Entered By: Dayton Martes on 09/12/2018 11:22:52 [name] ([id number]) -------------------------------------------------------------------------------- Lower Extremity Assessment Details Patient Name: [name] Date of Service: 09/12/2018 11:15 AM Medical Record Number: [id number] Patient Account Number: [id number] Date of Birth/Sex: [birth year] (84 y.o. F) Treating RN: Curtis Sites Primary Care Levander Katzenstein: Aram Beecham Other Clinician: Referring Liliya Fullenwider: Aram Beecham Treating Nahiara Kretzschmar/Extender: Altamese [place] in  Treatment: 59 Electronic Signature(s) Signed: 09/12/2018 5:06:12 PM By: Curtis Sites Entered By: Curtis Sites on 09/12/2018 11:35:18 [name] ([id number]) -------------------------------------------------------------------------------- Multi Wound Chart Details Patient Name: [name] Date of Service: 09/12/2018 11:15 AM Medical Record Number: [id number] Patient Account Number: [id number] Date of Birth/Sex: Jun 08, 1930 (84 y.o. F) Treating RN: Huel Coventry Primary Care Latreece Mochizuki: Aram Beecham Other Clinician: Referring Koi Zangara: Aram Beecham Treating Hersey Maclellan/Extender: Altamese [place] in Treatment: 18 Vital Signs Height(in): 60 Pulse(bpm): 63 Weight(lbs): 123 Blood Pressure(mmHg): 124/58 Body Mass Index(BMI): 24 Temperature(F): 98.0 Respiratory Rate 18 (breaths/min): Photos: [9:No Photos] [N/A:N/A] Wound Location: [9:Left Elbow] [N/A:N/A] Wounding Event: [9:Trauma] [N/A:N/A] Primary Etiology: [9:Trauma, Other] [N/A:N/A] Comorbid History: [9:Cataracts, Glaucoma, Optic Neuritis, Chronic sinus problems/congestion, Middle ear problems] [N/A:N/A] Date Acquired: [9:01/26/2018] [N/A:N/A] Weeks of Treatment: [9:19] [N/A:N/A] Wound Status: [9:Open] [N/A:N/A] Measurements L x W x D [9:0.6x0.5x0.3] [N/A:N/A] (cm) Area (cm) : [9:0.236] [N/A:N/A] Volume (cm) : [9:0.071] [N/A:N/A] % Reduction in Area: [9:-232.40%] [N/A:N/A] % Reduction in Volume: [9:-407.10%] [N/A:N/A] Starting Position 1 [9:5] (o'clock): Ending Position 1 [9:10] (o'clock): Maximum Distance 1 (cm): [9:0.8] Undermining: [9:Yes] [N/A:N/A] Classification: [9:Full Thickness With Exposed Support Structures] [N/A:N/A] Exudate Amount: [9:Medium] [N/A:N/A] Exudate Type: [9:Purulent] [N/A:N/A] Exudate Color: [9:yellow, brown, green] [N/A:N/A] Wound Margin: [9:Distinct, outline attached] [N/A:N/A] Granulation Amount: [9:Small (1-33%)] [N/A:N/A] Granulation Quality: [9:Red, Pink]  [N/A:N/A] Necrotic Amount: [9:Large (67-100%)] [N/A:N/A] Necrotic Tissue: [9:Eschar, Adherent Slough] [N/A:N/A] Exposed Structures: [9:Fat Layer (Subcutaneous Tissue) Exposed: Yes Fascia: No Tendon: No] [N/A:N/A] Muscle: No Joint: No Bone: No Epithelialization: None N/A N/A Debridement: Debridement - Excisional N/A N/A Pre-procedure 12:00 N/A N/A Verification/Time Out Taken: Pain Control: Other N/A N/A Tissue Debrided: Subcutaneous, Slough N/A N/A Level: Skin/Subcutaneous Tissue N/A N/A Debridement Area (sq cm): 0.3 N/A N/A Instrument: Curette N/A N/A Bleeding: Minimum N/A N/A Hemostasis Achieved: Pressure N/A N/A Debridement Treatment Procedure was tolerated well N/A N/A Response: Post Debridement 0.6x0.5x0.3 N/A N/A Measurements L x W x D (cm) Post Debridement Volume: 0.071 N/A N/A (cm) Periwound Skin  Texture: Callus: Yes N/A N/A Scarring: Yes Excoriation: No Induration: No Crepitus: No Rash: No Periwound Skin Moisture: Maceration: Yes N/A N/A Dry/Scaly: No Periwound Skin Color: Erythema: Yes N/A N/A Atrophie Blanche: No Cyanosis: No Ecchymosis: No Hemosiderin Staining: No Mottled: No Pallor: No Rubor: No Erythema Location: Circumferential N/A N/A Temperature: No Abnormality N/A N/A Tenderness on Palpation: Yes N/A N/A Wound Preparation: Ulcer Cleansing: N/A N/A Rinsed/Irrigated with Saline Topical Anesthetic Applied: Other: lidocaine 4% Procedures Performed: Cellular or Tissue Based N/A N/A Product Debridement Treatment Notes Electronic Signature(s) Signed: 09/13/2018 5:56:52 AM By: Baltazar Najjar MD Entered By: Baltazar Najjar on 09/12/2018 15:32:19 [name] ([id number]) -------------------------------------------------------------------------------- Multi-Disciplinary Care Plan Details Patient Name: [name] Date of Service: 09/12/2018 11:15 AM Medical Record Number: [id number] Patient Account Number: [id number] Date of Birth/Sex:  11/24/1930 (84 y.o. F) Treating RN: Huel Coventry Primary Care Marx Doig: Aram Beecham Other Clinician: Referring Patsie Mccardle: Aram Beecham Treating Jacksyn Beeks/Extender: Altamese [place] in Treatment: 45 Active Inactive ` Soft Tissue Infection Nursing Diagnoses: Impaired tissue integrity Goals: Patient's soft tissue infection will resolve Date Initiated: 07/18/2018 Target Resolution Date: 07/27/2018 Goal Status: Active Interventions: Assess signs and symptoms of infection every visit Treatment Activities: Culture and sensitivity : 07/18/2018 Systemic antibiotics : 07/18/2018 Notes: ` Wound/Skin Impairment Nursing Diagnoses: Impaired tissue integrity Knowledge deficit related to ulceration/compromised skin integrity Goals: Patient/caregiver will verbalize understanding of skin care regimen Date Initiated: 11/14/2017 Target Resolution Date: 11/28/2017 Goal Status: Active Ulcer/skin breakdown will have a volume reduction of 30% by week 4 Date Initiated: 11/14/2017 Target Resolution Date: 11/28/2017 Goal Status: Active Interventions: Assess patient/caregiver ability to obtain necessary supplies Assess patient/caregiver ability to perform ulcer/skin care regimen upon admission and as needed Assess ulceration(s) every visit Treatment Activities: Skin care regimen initiated : 11/14/2017 [name], [name] ([id number]) Notes: Electronic Signature(s) Signed: 09/13/2018 3:05:38 PM By: Elliot Gurney, BSN, RN, CWS, Kim RN, BSN Entered By: Elliot Gurney, BSN, RN, CWS, Kim on 09/12/2018 12:01:20 [name] ([id number]) -------------------------------------------------------------------------------- Pain Assessment Details Patient Name: [name] Date of Service: 09/12/2018 11:15 AM Medical Record Number: [id number] Patient Account Number: [id number] Date of Birth/Sex: [birth year] (84 y.o. F) Treating RN: Huel Coventry Primary Care Kazuko Clemence: Aram Beecham Other Clinician: Referring Karole Oo:  Aram Beecham Treating Eschol Auxier/Extender: Altamese [place] in Treatment: 43 Active Problems Location of Pain Severity and Description of Pain Patient Has Paino No Site Locations Pain Management and Medication Current Pain Management: Electronic Signature(s) Signed: 09/12/2018 1:31:10 PM By: Dayton Martes RCP, RRT, CHT Signed: 09/13/2018 3:05:38 PM By: Elliot Gurney, BSN, RN, CWS, Kim RN, BSN Entered By: Dayton Martes on 09/12/2018 11:23:01 [name] ([id number]) -------------------------------------------------------------------------------- Patient/Caregiver Education Details Patient Name: [name] Date of Service: 09/12/2018 11:15 AM Medical Record Number: [id number] Patient Account Number: [id number] Date of Birth/Gender: 1930-08-10 (84 y.o. F) Treating RN: Huel Coventry Primary Care Physician: Aram Beecham Other Clinician: Referring Physician: Aram Beecham Treating Physician/Extender: Altamese [place] in Treatment: 34 Education Assessment Education Provided To: Patient Education Topics Provided Wound/Skin Impairment: Handouts: Caring for Your Ulcer Methods: Demonstration, Explain/Verbal Responses: State content correctly Electronic Signature(s) Signed: 09/13/2018 3:05:38 PM By: Elliot Gurney, BSN, RN, CWS, Kim RN, BSN Entered By: Elliot Gurney, BSN, RN, CWS, Kim on 09/12/2018 12:17:02 [name] ([id number]) -------------------------------------------------------------------------------- Wound Assessment Details Patient Name: [name] Date of Service: 09/12/2018 11:15 AM Medical Record Number: [id number] Patient Account Number: [id number] Date of Birth/Sex: December 27, 1929 (84 y.o. F) Treating RN: Curtis Sites Primary Care Jarriel Papillion: Aram Beecham Other Clinician: Referring  Darci Lykins: Aram Beecham Treating Brigitte Soderberg/Extender: Altamese [place] in Treatment: 55 Wound Status Wound Number: 9 Primary Trauma,  Other Etiology: Wound Location: Left Elbow Wound Open Wounding Event: Trauma Status: Date Acquired: 01/26/2018 Comorbid Cataracts, Glaucoma, Optic Neuritis, Chronic Weeks Of Treatment: 19 History: sinus problems/congestion, Middle ear problems Clustered Wound: No Photos Photo Uploaded By: Curtis Sites on 09/12/2018 17:03:44 Wound Measurements Length: (cm) 0.6 Width: (cm) 0.5 Depth: (cm) 0.3 Area: (cm) 0.236 Volume: (cm) 0.071 % Reduction in Area: -232.4% % Reduction in Volume: -407.1% Epithelialization: None Tunneling: No Undermining: Yes Starting Position (o'clock): 5 Ending Position (o'clock): 10 Maximum Distance: (cm) 0.8 Wound Description Full Thickness With Exposed Support Classification: Structures Wound Margin: Distinct, outline attached Exudate Medium Amount: Exudate Type: Purulent Exudate Color: yellow, brown, green Foul Odor After Cleansing: No Slough/Fibrino Yes Wound Bed Granulation Amount: Small (1-33%) Exposed Structure Granulation Quality: Red, Pink Fascia Exposed: No Necrotic Amount: Large (67-100%) Fat Layer (Subcutaneous Tissue) Exposed: Yes [name], [name] ([id number]) Necrotic Quality: Eschar, Adherent Slough Tendon Exposed: No Muscle Exposed: No Joint Exposed: No Bone Exposed: No Periwound Skin Texture Texture Color No Abnormalities Noted: No No Abnormalities Noted: No Callus: Yes Atrophie Blanche: No Crepitus: No Cyanosis: No Excoriation: No Ecchymosis: No Induration: No Erythema: Yes Rash: No Erythema Location: Circumferential Scarring: Yes Hemosiderin Staining: No Mottled: No Moisture Pallor: No No Abnormalities Noted: No Rubor: No Dry / Scaly: No Maceration: Yes Temperature / Pain Temperature: No Abnormality Tenderness on Palpation: Yes Wound Preparation Ulcer Cleansing: Rinsed/Irrigated with Saline Topical Anesthetic Applied: Other: lidocaine 4%, Electronic Signature(s) Signed: 09/12/2018 5:06:12 PM By:  Curtis Sites Entered By: Curtis Sites on 09/12/2018 11:35:06 [name] ([id number]) -------------------------------------------------------------------------------- Vitals Details Patient Name: [name] Date of Service: 09/12/2018 11:15 AM Medical Record Number: [id number] Patient Account Number: [id number] Date of Birth/Sex: [birth year] (84 y.o. F) Treating RN: Huel Coventry Primary Care Chord Takahashi: Aram Beecham Other Clinician: Referring Elisabella Hacker: Aram Beecham Treating Quantasia Stegner/Extender: Altamese [place] in Treatment: 36 Vital Signs Time Taken: 11:23 Temperature (F): 98.0 Height (in): 60 Pulse (bpm): 63 Weight (lbs): 123 Respiratory Rate (breaths/min): 18 Body Mass Index (BMI): 24 Blood Pressure (mmHg): 124/58 Reference Range: 80 - 120 mg / dl Electronic Signature(s) Signed: 09/12/2018 1:31:10 PM By: Dayton Martes RCP, RRT, CHT Entered By: Weyman Rodney, Lucio Edward on 09/12/2018 11:28:02

## 2018-09-15 NOTE — Progress Notes (Signed)
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 presents with debris on the wound although it looks better. She is using Iodoflex entering her third week now. Still requiring debridement 01/16/18 on evaluation today patient seems to be doing fairly well in regard to her right lower extremity ulcer. She has been tolerating the dressing changes without complication. With that being said she does note that she's been having a lot of burning with the current dressing which is specifically the Iodoflex. Obviously this is a known side effect of the iodine in the dressing and I believe that may be giving her trouble. No fevers, chills, nausea, or vomiting noted at this time. Otherwise the wound does appear to be doing well. 01/30/18 on evaluation today patient appears to be doing well in regard to her right anterior lower extremity ulcer. She notes that this does seem to be smaller and she wonders why we did not start the Prisma dressing sooner since it has made such a big difference in such a short amount of time. I explained that obviously we have to wait for the wound to get to a certain point along his healing  path before we can initiate the Prisma otherwise it will not be effective. Therefore once the wound became [name], [name] ([id number]) clean it was then time to initiate the Prisma. Nonetheless good news is she is noting excellent improvement she does still have some discomfort but nothing as significant as previously noted. 04/17/18 on evaluation today patient appears to be doing very well and in fact her right lower extremity ulcer has completely healed at this point I'm pleased with this. The left lower extremity ulcer seem to be doing better although she still does have some openings noted the Prisma I think is helping more than the Xeroform was in my pinion. With that being said she still has a lot of healing to do in this regard. 04/27/18 on evaluation today patient appears to be doing very well in regard to her left lower Trinity ulcers. She has been tolerating the dressing changes without complication. I do have a note from her orthopedic surgeon today and they would like for me to help with treating her left elbow surgery site where she had the bursa removed and this was performed roughly 4 weeks ago according to  the note that I reviewed. She has been placed on Bactrim DS by need for her leg wounds this probably helped a little bit with the left elbow surgery site. Obviously I do think this is something we can try to help her out with. 05/04/18 on evaluation today patient appears to be doing well in regard to her left anterior lower Trinity ulcers. She is making good progress which is great news. Unfortunately her elbow which we are also managing at this point in time has not made as much progress unfortunately. She has been tolerating the dressing changes without complication. She did see Dr. Darleen Crocker earlier today and he states that he's willing to give this three weeks to see if she's making any progress with wound care. However he states that she's really not then he will need to go back in  and perform further surgery. Obviously she is trying to avoid surgery if at all possible although I'm not sure if this is going to be possible or least not that quickly. 05/11/18 on evaluation today patient appears to be doing very well in regard to her left lower extremity ulcers. Unfortunately in regard to her elbow this is very slow coming about as far as any improvement is concerned. I do feel like there may be a little bit more granulation noted in the base of the wound but nothing too significant unfortunately. I still can probe bone in the proximal portion of the wound which obviously explain to the patient is not good. She will be having a follow-up with her orthopedic surgeon in the next couple of weeks. In the meantime we are trying to do as much as we can to try to show signs of improvement in healing to avoid the need for any additional and further surgery. Nonetheless I explained to the patient yet again today I'm not sure if that is going to be feasible or not obviously it's more risk for her to continue to have an open wound with bone exposure then to the back in for additional surgery even though I know she doesn't want to go that route. 05/15/18 on evaluation today patient presents for follow-up concerning her ongoing lower extremity ulcers on the left as well as the left elbow ulcer. She has at this point in time been tolerating the dressing changes without complication. Her left lower extremity ulcer appears to be doing very well. In regard to the left elbow ulcer she actually does seem to have additional granulation today which is good news. I am definitely seeing signs of improvement although obviously this is somewhat slow improvement. Nonetheless I'm hopeful we will be able to avoid her having to have any further surgery but again that would definitely be a conversation between herself as well as her surgeon once he sees her for reevaluation. Otherwise she does want to see about  having a three order compression stockings for her today 05/21/18 on evaluation today patient appears to be doing well in regard to her left lower surety ulcer. This is almost completely healed and seems to be progressing very nicely. With that being said her left elbow is another story. I'm not really convinced in the past three weeks we've seen a significant improvement in this wound. With that being said if this is something that there is no surgical option for him we have to continue to work on this from the standpoint of conservative management with wound care she may make improvement given time. Nonetheless it appears  that her surgeon is somewhat concerned about the possibility of infection and really is leaning towards additional surgery to try and help close this wound. Nonetheless the patient is still unsure of exactly what to do. 05/29/18 on evaluation today patient appears to be doing well in regard to her left lower extremity ulcer. She's been tolerating the dressing changes without complication which is good news. With that being said she's been having issues specifically with her elbow she did see her surgeon Dr. Joice Lofts and he is recommending a repeat surgery to the left elbow in order to correct the issue. The patient is still somewhat unsure of this but feels like this may be better than trying to take time to let this heal over a longer period of time through normal wound care measures. Again I explained that I agree this may be a faster way to go if her surgeon feels that this is indeed a good direction to take. Obviously only he can make the judgment on whether or not the surgery would likely be successful. 06/04/18 on evaluation today patient actually presents for follow-up concerning her left elbow and left lower from the ulcer she seems to be doing very well at this point in time. She has been tolerating the dressing changes without complication. With that being said her elbow is  not significantly better she actually is scheduled for surgery tomorrow. 07/04/18; the patient had an area on her left leg that is remaining closed. The open area she has now is a postsurgical wound on the left elbow. I think we have clearance from the surgeon to see this now. [address] [name] ([id number]) 07/11/18; we're currently dealing with a surgical wound on the left olecranon process. The patient complains of a lot of pain and drainage. When I saw her last week we did an x-ray that showed soft tissue wound and probable elbow joint effusion but no erosion to suggest osteomyelitis. The culture I did of this was somewhat surprisingly negative. She has a small open wound with not a viable surface there is considerable undermining relative to the wound size. She is on methotrexate for rheumatoid arthritis/overlap syndrome also plaquenil. We've been using silver collagen 07/18/18-She is seen in follow-up evaluation for a left elbow wound. There is essentially no change. She is currently on Zithromax and will complete that on Friday, there is no indication to extend this. We will change to iodosorb/iodoflex and monitor for response 07/25/18-She is seen in follow-up evaluation for left elbow wound. The wound is stable with no overt evidence of infection. She has counseled with her rheumatologist. She is wanting to restart her methotrexate; a culture was obtained to rule out occult infection before starting her methotrexate. We will continue with Iodosorb/Iodoflex and she will follow-up next week. 08/01/18; this is a difficult wound over her left olecranon process. There is been concerned about infection although cultures including one done last week were negative. Pending 3 weeks ago I gave her an empiric course of antibiotics. She is having a lot of rheumatologic pain in her hands with pain and stiffness. She wants to go on her weekly methotrexate and I think it would be reasonable to  do so. We have been using Iodoflex 08/01/18; difficult wound over her left olecranon process. She started back on methotrexate last week because of rheumatologic pain in her hands. We have been using Iodoflex to try and clean out the wound bed. She has been approved for Graphix PL 08/15/18;  2 week follow-up. Difficult wound over her left olecranon process. Graphix PL #1 with collagen backing 08/22/18; one-week follow-up. Difficult wound over her left olecranon process. Graphix PL #2 08/29/18; no major improvement. Difficult wound over her left olecranon process. Still considerable undermining. Graphics PL #3 1 week follow-up. Graphics #4 09/12/18 graphics #5. Some improvement in wound area although the undermining superiorly still has not closed down as much as I would like Objective Constitutional Sitting or standing Blood Pressure is within target range for patient.. Pulse regular and within target range for patient.Marland Barnett Respirations regular, non-labored and within target range.. Temperature is normal and within the target range for the patient.Marland Barnett appears in no distress. Vitals Time Taken: 11:23 AM, Height: 60 in, Weight: 123 lbs, BMI: 24, Temperature: 98.0 F, Pulse: 63 bpm, Respiratory Rate: 18 breaths/min, Blood Pressure: 124/58 mmHg. General Notes: Wound exam; debrided with a #3 curet to remove surface adherent necrotic material. Then graphix PL was applied with careful attention to try to pack this into the undermining area at 12:00. In general the surface of the wound looks better and we have adherence of about 50% of the wound circumference Integumentary (Hair, Skin) Wound #9 status is Open. Original cause of wound was Trauma. The wound is located on the Left Elbow. The wound measures 0.6cm length x 0.5cm width x 0.3cm depth; 0.236cm^2 area and 0.071cm^3 volume. There is Fat Layer (Subcutaneous Tissue) Exposed exposed. There is no tunneling noted, however, there is undermining starting at  5:00 and ending at 10:00 with a maximum distance of 0.8cm. There is a medium amount of purulent drainage noted. The wound margin is distinct with the outline attached to the wound base. There is small (1-33%) red, pink granulation within the wound bed. There is a large (67-100%) amount of necrotic tissue within the wound bed including Eschar and Adherent Slough. The periwound skin appearance exhibited: Callus, Scarring, Maceration, Erythema. The periwound skin appearance did not exhibit: Crepitus, Excoriation, Induration, Rash, Dry/Scaly, Atrophie Blanche, Cyanosis, Ecchymosis, Hemosiderin Staining, [name], [name] ([id number]) Mottled, Pallor, Rubor. The surrounding wound skin color is noted with erythema which is circumferential. Periwound temperature was noted as No Abnormality. The periwound has tenderness on palpation. 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 There was a Excisional Skin/Subcutaneous Tissue Debridement with a total area of 0.3 sq cm performed by Maxwell Caul, MD. With the following instrument(s): Curette to remove Viable and Non-Viable tissue/material. Material removed includes Subcutaneous Tissue and Slough and after achieving pain control using Other (lidocaine 4%). No specimens were taken. A time out was conducted at 12:00, prior to the start of the procedure. A Minimum amount of bleeding was controlled with Pressure. The procedure was tolerated well. Post Debridement Measurements: 0.6cm length x 0.5cm width x 0.3cm depth; 0.071cm^3 volume. Character of Wound/Ulcer Post Debridement requires further debridement. Post procedure Diagnosis Wound #9: Same as Pre-Procedure Pre-procedure diagnosis of Wound #9 is a Trauma, Other  located on the Left Elbow. A skin graft procedure using a bioengineered skin substitute/cellular or tissue based product was performed by Maxwell Caul, MD with the following instrument(s): Forceps and Scissors. Other was applied and secured with Steri-Strips. 3 sq cm of product was utilized and 0 sq cm was wasted. Post Application, mepitel one was applied. A Time  Out was conducted at 12:02, prior to the start of the procedure. The procedure was tolerated well with a pain level of 1 throughout and a pain level of 0 following the procedure. Post procedure Diagnosis Wound #9: Same as Pre-Procedure . Plan Wound Cleansing: Wound #9 Left Elbow: Clean wound with Normal Saline. Anesthetic (add to Medication List): Wound #9 Left Elbow: Topical Lidocaine 4% cream applied to wound bed prior to debridement (In Clinic Only). Skin Barriers/Peri-Wound Care: Wound #9 Left Elbow: Skin Prep Primary Wound Dressing: [name], [name] ([id number]) Other: - Endoform in wound and in undermining Dressing Change Frequency: Wound #9 Left Elbow: Change dressing every week - may change outer dressing if it gets soiled or wet Follow-up Appointments: Wound #9 Left Elbow: Return Appointment in 1 week. Advanced Therapies: Wound #9 Left Elbow: Grafix PL application in clinic; including contact layer, fixation with steri strips, dry gauze and cover dressing. #1 graffixNo. 5 applied. I think we've had some improvement. There is still a deep tunnel however which I have not been able to close or promote tissue adherence in that part of the wound arc Electronic Signature(s) Signed: 09/13/2018 5:56:52 AM By: Baltazar Najjar MD Entered By: Baltazar Najjar on 09/12/2018 15:36:39 [name] ([id number]) -------------------------------------------------------------------------------- SuperBill Details Patient Name: [name] Date of Service: 09/12/2018 Medical Record Number: [id number] Patient Account  Number: [id number] Date of Birth/Sex: 1930-11-16 (84 y.o. 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  without evidence of necrosis CPT4: 32440102 Q4133- Grafix PL Prime 1.5 x 2 cm 3 Physician Procedures CPT4: Description Modifier Quantity Code [id number] 15271 - WC PHYS SKIN SUB GRAFT TRNK/ARM/LEG 1 ICD-10 Diagnosis Description S51.002S Unspecified open wound of left elbow, sequela L98.495 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  involvement without evidence of necrosis Electronic Signature(s) Signed: 09/13/2018 4:54:32 PM By: Francie Massing Previous Signature: 09/13/2018 5:56:52 AM Version By: Baltazar Najjar MD Previous Signature: 09/12/2018 12:16:33 PM Version By: Elliot Gurney, BSN, RN, CWS, Kim RN, BSN Entered By: Francie Massing on 09/13/2018 16:54:18

## 2018-09-17 ENCOUNTER — Ambulatory Visit: Payer: Medicare Other

## 2018-09-17 DIAGNOSIS — R2681 Unsteadiness on feet: Secondary | ICD-10-CM

## 2018-09-17 DIAGNOSIS — Z9181 History of falling: Secondary | ICD-10-CM

## 2018-09-17 DIAGNOSIS — M545 Low back pain: Secondary | ICD-10-CM

## 2018-09-17 DIAGNOSIS — R262 Difficulty in walking, not elsewhere classified: Secondary | ICD-10-CM

## 2018-09-17 DIAGNOSIS — M6281 Muscle weakness (generalized): Secondary | ICD-10-CM

## 2018-09-17 DIAGNOSIS — G8929 Other chronic pain: Secondary | ICD-10-CM

## 2018-09-17 NOTE — Therapy (Signed)
[facility] PHYSICAL AND [facility]ORTS MEDICINE [address], [place], [address] Phone: [phone]   Fax:  [phone]  Physical Therapy Treatment  Patient Details  Name: [name] MRN: [id number] Date of Birth: [birth year] Referring Provider (PT): Cristopher Peru, MD   Encounter Date: 09/17/2018  PT End of Session - 09/17/18 1435    Visit Number  4    Number of Visits  17    Date for PT Re-Evaluation  11/01/18    Authorization Type  4    Authorization Time Period  of 10 progress report    PT Start Time  1435    PT Stop Time  1523    PT Time Calculation (min)  48 min    Equipment Utilized During Treatment  Gait belt    Activity Tolerance  Patient tolerated treatment well    Behavior During Therapy  WFL for tasks assessed/performed       Past Medical History:  Diagnosis Date  . Anemia   . GERD (gastroesophageal reflux disease)   . Hypertension   . Peripheral vascular disease (HCC)    possible neuropathies in lower extremeties  . PONV (postoperative nausea and vomiting)    happens sometimes but better with pre med of zofran  . Presence of permanent cardiac pacemaker   . Syncope     Past Surgical History:  Procedure Laterality Date  . ABDOMINAL HYSTERECTOMY  1969  . APPENDECTOMY  1969   with hysterectomy  . BACK SURGERY  2001   rods in back. surgery on back x 3  . COLONOSCOPY    . I&D EXTREMITY Left 03/27/2018   Procedure: IRRIGATION AND DEBRIDEMENT LEFT ELBOW / OLECRANON BURSA;  Surgeon: Christena Flake, MD;  Location: [facility];  Service: Orthopedics;  Laterality: Left;  . I&D EXTREMITY Left 06/05/2018   Procedure: IRRIGATION AND DEBRIDEMENT EXTREMITY;  Surgeon: Christena Flake, MD;  Location: [facility];  Service: Orthopedics;  Laterality: Left;  . INSERT / REPLACE / REMOVE PACEMAKER  2004  . JOINT REPLACEMENT Left 2004   partial knee replacement  . OLECRANON BURSECTOMY Left 03/27/2018   Procedure: LEFT OLECRANON BURSA;  Surgeon:  Christena Flake, MD;  Location: [facility];  Service: Orthopedics;  Laterality: Left;  . PACEMAKER INSERTION  2014   dual chamber for complete heart block    There were no vitals filed for this visit.  Subjective Assessment - 09/17/18 1437    Subjective  Pt states that she lost her balance and fell yesterday at the kitchen. The neighbor was called to help her up. Has a soreness at the back of her head to the R. Told her doctor about it this morning.  Does not feel like she broke anything.  LOB backwards while grilling lamb chops. Might have grabbed the oven door for balance but the door went backwards with the patient.  L rear-end soreness > R.   Feels uncertain with walking.  Pt states that she sleeps on her back due to bilateral lateral hip pain.  Balance feels worse in the morning. Better as the day progresses.     Pertinent History  Imparied gait. Pt states that she has had balance issues for too long. Feels more comfortable walking with her rw. Had tests which showed that she has neuropathy. However the doctor at [facility] said that her balance is coming from her back. Just wants to be strong so she can walk.  Started walking with her  rw off and on for the past several months.  Also feels like she might need a new sleep number bed. Also had 2 wound surgeries at [name]. Pt fell in February 2019. Had to stop PT due to the L elbow surgery April 06, 2018 to remove a bursa. The wound still has not healed.   Currently sees a wound doctor.   Feels numbness L foot from the ankle down.  No R foot numbness.  Sometimes unsure of where her L LE is in space.  Pt also states that she tires too quickly.  Has not been able to walk her dog in 2 years. Has not really been doing exercises at home except the sit <> stand. Pt states that prolonged sitting without back support will increase back pain     Limitations  Standing;Walking    How long can you sit comfortably?  1 hr    How long can you stand comfortably?  10  minutes    How long can you walk comfortably?  2 or 3 blocks    Diagnostic tests  EMG testing BLEs and BUEs which were negative    Patient Stated Goals  Improve LE strength. Wants to use the machines at the gym. Walk more steadily. Be able to take her dog (16 lbs) out on a leash.     Currently in Pain?  Yes    Pain Score  7    L rear end ache   Pain Orientation  Left    Pain Descriptors / Indicators  Aching    Pain Onset  More than a month ago                               PT Education - 09/17/18 1509    Education provided  Yes    Education Details  ther-ex, HEP    Person(s) Educated  Patient    Methods  Explanation;Demonstration;Tactile cues;Verbal cues;Handout    Comprehension  Returned demonstration;Verbalized understanding       Objectives  Pt ambulating with rw  TTP bilateral greater trochanter L > R   L elbow wound    Ther-ex  Standing forward weight shifting 10x2 with 5 seconds holds   Standing alternating toe taps onto treadmill platform with light touch assist 10x2 each LE  Seated clamshell isometrics 40 % effort 1 min x 5 with 1 minute rest breaks in between. Decreased hip discomfort with walking afterwards.  Sit <> stand from regular chair with  bilateral UE assist, emphasis on femoral control 5x 2   Seated naval ins/transversus abdominis contraction 10x2 with 5 seconds to promote trunk strength and help decrease back pain.   Reviewed and given as part of her HEP. Pt demonstrated and verbalized understanding.   Improved exercise technique, movement at target joints, use of target muscles after mod verbal, visual, tactile cues.   Worked on gentle forward weight shifting to help decrease backward lean and decrease LOB backwards. Worked on glute med isometrics to help decrease lateral hip discomfort with walking. Worked on LE strengthening as well with sit <> stand exercise to promote strength to help support herself when performing  standing tasks, emphasis on femoral control as well to help decrease stress to bilateral lateral hips. Pt tolerated session well without aggravation of symptoms. Patient will benefit from continued skilled physical therapy services to improve strength, balance, and decrease fall risk.  PT Short Term Goals - 09/06/18 2136      PT SHORT TERM GOAL #1   Title  Patient will be independent with her HEP to improve strength and balance.     Time  3    Period  Weeks    Status  New    Target Date  09/27/18        PT Long Term Goals - 09/06/18 1836      PT LONG TERM GOAL #1   Title  Patient will improve her 10 MWT to 0.8 m/s or more with rw to promote better community ambulation.     Baseline  0.5 seconds with rw (09/06/2018)    Time  8    Period  Weeks    Status  New    Target Date  11/01/18      PT LONG TERM GOAL #2   Title  Patient will improve her DGI score using rw or least restrictive AD to 19/24 or more to promote balance.     Baseline  13/24 using rw (09/06/2018)    Time  8    Period  Weeks    Status  New    Target Date  11/01/18      PT LONG TERM GOAL #3   Title  Patient will improve bilateral LE strength by at least 1/2 MMT to promote ability to support herself when performing standing tasks and improve balance.     Time  8    Period  Weeks    Status  New    Target Date  11/01/18      PT LONG TERM GOAL #4   Title  Patient will have a decrease in back pain/ache to 5/10 or less to promote ability to perform standing tasks with less difficulty.    Baseline  8/10 at most for the past 3 months (09/06/2018)    Time  8    Period  Weeks    Status  New    Target Date  11/01/18            Plan - 09/17/18 1529    Clinical Impression Statement  Worked on gentle forward weight shifting to help decrease backward lean and decrease LOB backwards. Worked on glute med isometrics to help decrease lateral hip discomfort with walking. Worked on LE strengthening as  well with sit <> stand exercise to promote strength to help support herself when performing standing tasks, emphasis on femoral control as well to help decrease stress to bilateral lateral hips. Pt tolerated session well without aggravation of symptoms. Patient will benefit from continued skilled physical therapy services to improve strength, balance, and decrease fall risk.     Rehab Potential  Fair    Clinical Impairments Affecting Rehab Potential  (-) chronicity of condition, age, comorbidities; (+) motivated    PT Frequency  2x / week    PT Duration  8 weeks    PT Treatment/Interventions  Aquatic Therapy;Gait training;Therapeutic activities;Therapeutic exercise;Balance training;Neuromuscular re-education;Patient/family education;Manual techniques;Dry needling    PT Next Visit Plan  trunk, hip, scapular strengthening, balance, gait, manual techniques PRN    PT Home Exercise Plan  Sit<>stand, standing hip ext and abd, bridging, and heel raises    Consulted and Agree with Plan of Care  Patient       Patient will benefit from skilled therapeutic intervention in order to improve the following deficits and impairments:  Abnormal gait, Decreased activity tolerance, Decreased balance, Decreased endurance, Decreased range of  motion, Decreased strength, Difficulty walking, Improper body mechanics, Postural dysfunction, Pain, Decreased knowledge of use of DME  Visit Diagnosis: Unsteadiness on feet  History of falling  Muscle weakness (generalized)  Difficulty in walking, not elsewhere classified  Chronic low back pain, unspecified back pain laterality, unspecified whether sciatica present     Problem List Patient Active Problem List   Diagnosis Date Noted  . Septic olecranon bursitis of left elbow 03/27/2018  . Lymphedema 10/11/2017  . Chronic venous insufficiency 10/11/2017  . Leg pain 10/11/2017  . Leg swelling 10/11/2017  . COPD (chronic obstructive pulmonary disease) (HCC) 10/11/2017   . Essential hypertension 10/11/2017   Loralyn Freshwater PT, DPT   09/17/2018, 3:36 PM  [facility] [place] [facility] PHYSICAL AND SPORTS MEDICINE [address], [place], [address] Phone: [phone]   Fax:  [phone]  Name: MAKINZIE CONSIDINE MRN: [id number] Date of Birth: 1930/04/21

## 2018-09-17 NOTE — Patient Instructions (Signed)
  Sitting naval ins   Sitting on a chair   Gently tighten your tummy for 5 seconds.    Make sure you keep breathing.    Repeat 10 times   Perform 3 sets daily

## 2018-09-19 ENCOUNTER — Ambulatory Visit: Payer: Medicare Other

## 2018-09-19 ENCOUNTER — Encounter: Payer: Medicare Other | Admitting: Internal Medicine

## 2018-09-19 DIAGNOSIS — R2681 Unsteadiness on feet: Secondary | ICD-10-CM | POA: Diagnosis not present

## 2018-09-19 DIAGNOSIS — S51002A Unspecified open wound of left elbow, initial encounter: Secondary | ICD-10-CM | POA: Diagnosis not present

## 2018-09-19 DIAGNOSIS — M545 Low back pain, unspecified: Secondary | ICD-10-CM

## 2018-09-19 DIAGNOSIS — R262 Difficulty in walking, not elsewhere classified: Secondary | ICD-10-CM

## 2018-09-19 DIAGNOSIS — G8929 Other chronic pain: Secondary | ICD-10-CM

## 2018-09-19 DIAGNOSIS — M6281 Muscle weakness (generalized): Secondary | ICD-10-CM

## 2018-09-19 DIAGNOSIS — Z9181 History of falling: Secondary | ICD-10-CM

## 2018-09-19 NOTE — Therapy (Signed)
[facility] [address] [place], [address] Phone: [phone]   Fax:  [phone]  Physical Therapy Treatment  Patient Details  Name: [name] MRN: [id number] Date of Birth: November 25, 1930 Referring Provider (PT): Cristopher Peru, MD   Encounter Date: 09/19/2018  PT End of Session - 09/19/18 1520    Visit Number  5    Number of Visits  17    Date for PT Re-Evaluation  11/01/18    Authorization Type  5    Authorization Time Period  of 10 progress report    PT Start Time  1520    PT Stop Time  1600    PT Time Calculation (min)  40 min    Equipment Utilized During Treatment  Gait belt    Activity Tolerance  Patient tolerated treatment well    Behavior During Therapy  [facility] for tasks assessed/performed       Past Medical History:  Diagnosis Date  . Anemia   . GERD (gastroesophageal reflux disease)   . Hypertension   . Peripheral vascular disease (HCC)    possible neuropathies in lower extremeties  . PONV (postoperative nausea and vomiting)    happens sometimes but better with pre med of zofran  . Presence of permanent cardiac pacemaker   . Syncope     Past Surgical History:  Procedure Laterality Date  . ABDOMINAL HYSTERECTOMY  1969  . APPENDECTOMY  1969   with hysterectomy  . BACK SURGERY  2001   rods in back. surgery on back x 3  . COLONOSCOPY    . I&D EXTREMITY Left 03/27/2018   Procedure: IRRIGATION AND DEBRIDEMENT LEFT ELBOW / OLECRANON BURSA;  Surgeon: Christena Flake, MD;  Location: [facility];  Service: Orthopedics;  Laterality: Left;  . I&D EXTREMITY Left 06/05/2018   Procedure: IRRIGATION AND DEBRIDEMENT EXTREMITY;  Surgeon: Christena Flake, MD;  Location: [facility];  Service: Orthopedics;  Laterality: Left;  . INSERT / REPLACE / REMOVE PACEMAKER  2004  . JOINT REPLACEMENT Left 2004   partial knee replacement  . OLECRANON BURSECTOMY Left 03/27/2018   Procedure: LEFT OLECRANON BURSA;  Surgeon:  Christena Flake, MD;  Location: [facility];  Service: Orthopedics;  Laterality: Left;  . PACEMAKER INSERTION  2014   dual chamber for complete heart block    There were no vitals filed for this visit.  Subjective Assessment - 09/19/18 1521    Subjective  Had a problem with a medication. One of her medications made her walking unsteady. The medication was supposed to help with neuropathy.  The medication was stopped and has a follow up appointment with her doctor in a week.   Went to the wound appointment this morning and there is some healing seen at [name].     Pertinent History  Imparied gait. Pt states that she has had balance issues for too long. Feels more comfortable walking with her rw. Had tests which showed that she has neuropathy. However the doctor at [facility] said that her balance is coming from her back. Just wants to be strong so she can walk.  Started walking with her rw off and on for the past several months.  Also feels like she might need a new sleep number bed. Also had 2 wound surgeries at [name]. Pt fell in February 2019. Had to stop PT due to the L elbow surgery April 06, 2018 to remove a bursa.  The wound still has not healed.   Currently sees a wound doctor.   Feels numbness L foot from the ankle down.  No R foot numbness.  Sometimes unsure of where her L LE is in space.  Pt also states that she tires too quickly.  Has not been able to walk her dog in 2 years. Has not really been doing exercises at home except the sit <> stand. Pt states that prolonged sitting without back support will increase back pain     Limitations  Standing;Walking    How long can you sit comfortably?  1 hr    How long can you stand comfortably?  10 minutes    How long can you walk comfortably?  2 or 3 blocks    Diagnostic tests  EMG testing BLEs and BUEs which were negative    Patient Stated Goals  Improve LE strength. Wants to use the machines at the gym. Walk more steadily. Be able to take her  dog (16 lbs) out on a leash.     Currently in Pain?  No/denies    Pain Onset  More than a month ago                               PT Education - 09/19/18 1523    Education provided  Yes    Education Details  ther-ex    [employer]) Educated  Patient    Methods  Explanation;Demonstration;Tactile cues;Verbal cues    Comprehension  Returned demonstration;Verbalized understanding      Objectives  Pt ambulating with rw  TTP bilateral greater trochanter L > R   L elbow wound    Ther-ex  CGA with standing activities.   Standing forward weight shifting 10x3 with 5 seconds holds   Side stepping with bilateral UE assist 5 ft to the R and 5 ft to the L for 6x  Gait forward with one UE assist 5x, 2 Gait backward with one UE assist 5x2  Standing alternating toe taps onto treadmill platform with light touch assist 10x3 each LE  Standing mini squats with bilateral UE assist, emphasis on femoral control 5x3. Light touch to no UE assist  LAQ resisting 2 lbs 10x3 each LE  Standing hip abduction with bilateral UE assist 10x3 each LE  glute med muscle use felt   Seated naval ins/transversus abdominis contraction 10x2 with 5 seconds to promote trunk strength and help decrease back pain.   Sit <> stand throughout session with emphasis on femoral control    Improved exercise technique, movement at target joints, use of target muscles after mod verbal, visual, tactile cues.   Continued working on LE strengthening and balance to help decrease fall risk. Pt tolerated session well without aggravation of symptoms.      PT Short Term Goals - 09/06/18 2136      PT SHORT TERM GOAL #1   Title  Patient will be independent with her HEP to improve strength and balance.     Time  3    Period  Weeks    Status  New    Target Date  09/27/18        PT Long Term Goals - 09/06/18 1836      PT LONG TERM GOAL #1   Title  Patient will improve her 10 MWT to 0.8  m/s or more with rw to promote better community ambulation.     Baseline  0.5 seconds with rw (09/06/2018)    Time  8    Period  Weeks    Status  New    Target Date  11/01/18      PT LONG TERM GOAL #2   Title  Patient will improve her DGI score using rw or least restrictive AD to 19/24 or more to promote balance.     Baseline  13/24 using rw (09/06/2018)    Time  8    Period  Weeks    Status  New    Target Date  11/01/18      PT LONG TERM GOAL #3   Title  Patient will improve bilateral LE strength by at least 1/2 MMT to promote ability to support herself when performing standing tasks and improve balance.     Time  8    Period  Weeks    Status  New    Target Date  11/01/18      PT LONG TERM GOAL #4   Title  Patient will have a decrease in back pain/ache to 5/10 or less to promote ability to perform standing tasks with less difficulty.    Baseline  8/10 at most for the past 3 months (09/06/2018)    Time  8    Period  Weeks    Status  New    Target Date  11/01/18            Plan - 09/19/18 1528    Clinical Impression Statement  Continued working on LE strengthening and balance to help decrease fall risk. Pt tolerated session well without aggravation of symptoms.     Rehab Potential  Fair    Clinical Impairments Affecting Rehab Potential  (-) chronicity of condition, age, comorbidities; (+) motivated    PT Frequency  2x / week    PT Duration  8 weeks    PT Treatment/Interventions  Aquatic Therapy;Gait training;Therapeutic activities;Therapeutic exercise;Balance training;Neuromuscular re-education;Patient/family education;Manual techniques;Dry needling    PT Next Visit Plan  trunk, hip, scapular strengthening, balance, gait, manual techniques PRN    PT Home Exercise Plan  Sit<>stand, standing hip ext and abd, bridging, and heel raises    Consulted and Agree with Plan of Care  Patient       Patient will benefit from skilled therapeutic intervention in order to improve the  following deficits and impairments:  Abnormal gait, Decreased activity tolerance, Decreased balance, Decreased endurance, Decreased range of motion, Decreased strength, Difficulty walking, Improper body mechanics, Postural dysfunction, Pain, Decreased knowledge of use of DME  Visit Diagnosis: Unsteadiness on feet  History of falling  Muscle weakness (generalized)  Difficulty in walking, not elsewhere classified  Chronic low back pain, unspecified back pain laterality, unspecified whether sciatica present     Problem List Patient Active Problem List   Diagnosis Date Noted  . Septic olecranon bursitis of left elbow 03/27/2018  . Lymphedema 10/11/2017  . Chronic venous insufficiency 10/11/2017  . Leg pain 10/11/2017  . Leg swelling 10/11/2017  . COPD (chronic obstructive pulmonary disease) (HCC) 10/11/2017  . Essential hypertension 10/11/2017    Loralyn Freshwater PT, DPT    09/19/2018, 6:51 PM  [facility] PHYSICAL AND SPORTS MEDICINE [address], [place], [address] Phone: [phone]   Fax:  [phone]  Name: [name] MRN: [id number] Date of Birth: Jul 09, 1930

## 2018-09-22 NOTE — Progress Notes (Signed)
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 dressing changes without complication. I do have a note from her orthopedic surgeon today and they would like for me to help with treating her left elbow surgery site where she had the bursa removed and this was performed roughly 4 weeks ago according to the note that I reviewed. She has been placed on Bactrim DS by need for her leg wounds this probably helped a little bit with the left elbow surgery site. Obviously I do think this is something we can try to help her out with. 05/04/18 on evaluation today patient appears to be doing well in regard to her left anterior lower Trinity ulcers. She is making good progress which is great news. Unfortunately her elbow which we are also managing at this point in time has not made as much progress unfortunately. She has been tolerating the dressing changes without complication. She did see Dr. Darleen Crocker earlier today and he states that he's willing to give this three weeks to see if she's making any progress with wound care. However he states that she's really not then he will need to go back in  and perform further surgery. Obviously she is trying to avoid surgery if at all possible although I'm not sure if this is going to be possible or least not that quickly. 05/11/18 on evaluation today patient appears to be doing very well in regard to her left lower extremity ulcers. Unfortunately in regard to her elbow this is very slow coming about as far as any improvement is concerned. I do feel like there may be a little bit more granulation noted in the base of the wound but nothing too significant unfortunately. I still can probe bone in the proximal portion of the wound which obviously explain to the patient is not good. She will be having a follow-up with  her orthopedic surgeon in the next couple of weeks. In the meantime we are trying to do as much as we can to try to show signs of improvement in healing to avoid the need for any additional and further surgery. Nonetheless I explained to the patient yet again today I'm not sure if that is going to be feasible or not obviously it's more risk for her to continue to have an open wound with bone exposure then to the back in for additional surgery even though I know she doesn't want to go that route. 05/15/18 on evaluation today patient presents for follow-up concerning her ongoing lower extremity ulcers on the left as well as the left elbow ulcer. She has at this point in time been tolerating the dressing changes without complication. Her left lower extremity ulcer appears to be doing very well. In regard to the left elbow ulcer she actually does seem to have additional granulation today which is good news. I am definitely seeing signs of improvement although obviously this is somewhat slow improvement. Nonetheless I'm hopeful we will be able to avoid her having to have any further surgery but again that would definitely be a conversation between herself as well as her surgeon once he sees her for reevaluation. Otherwise she does want to see about  having a three order compression stockings for her today 05/21/18 on evaluation today patient appears to be doing well in regard to her left lower surety ulcer. This is almost completely healed and seems to be progressing very nicely. With that being said her left elbow is another story. I'm not really convinced in the past three weeks we've seen a significant improvement in this wound. With that being said if this is something that there is no surgical option for him we have to continue to work on this from the standpoint of conservative management with wound care she may make improvement given time. Nonetheless it appears that her surgeon is somewhat concerned about the possibility of infection and really is leaning towards additional surgery to try and help close this wound. Nonetheless the patient is still unsure of exactly what to do. 05/29/18 on evaluation today patient appears to be doing well in regard to her left lower extremity ulcer. She's been tolerating the dressing changes without complication which is good news. With that being said she's been having issues specifically with her elbow she did see her surgeon Dr. Joice Lofts and he is recommending a repeat surgery to the left elbow in order to correct the issue. The patient is still somewhat unsure of this but feels like this may be better than trying to take time to let this heal over a longer period of time through normal wound care measures. Again I explained that I agree this may be a faster way to go if her surgeon feels that this is indeed a good direction to take. Obviously only he can make the judgment on whether or not the surgery would likely be successful. 06/04/18 on evaluation today patient actually presents for follow-up concerning her left elbow and left lower from the ulcer she seems to be doing very well at this point in time. She has been tolerating the dressing changes without complication. With that being said her elbow is  not significantly better she actually is scheduled for surgery tomorrow. 07/04/18; the patient had an area on her left leg that is remaining closed. The open area she has now is a postsurgical wound on the left elbow.  I think we have clearance from the surgeon to see this now. [address], [place] VMarland Kitchen ([id number]) 07/11/18; we're currently dealing with a surgical wound on the left olecranon process. The patient complains of a lot of pain and drainage. When I saw her last week we did an x-ray that showed soft tissue wound and probable elbow joint effusion but no erosion to suggest osteomyelitis. The culture I did of this was somewhat surprisingly negative. She has a small open wound with not a viable surface there is considerable undermining relative to the wound size. She is on methotrexate for rheumatoid arthritis/overlap syndrome also plaquenil. We've been using silver collagen 07/18/18-She is seen in follow-up evaluation for a left elbow wound. There is essentially no change. She is currently on Zithromax and will complete that on Friday, there is no indication to extend this. We will change to iodosorb/iodoflex and monitor for response 07/25/18-She is seen in follow-up evaluation for left elbow wound. The wound is stable with no overt evidence of infection. She has counseled with her rheumatologist. She is wanting to restart her methotrexate; a culture was obtained to rule out occult infection before starting her methotrexate. We will continue with Iodosorb/Iodoflex and she will follow-up next week. 08/01/18; this is a difficult wound over her left olecranon process. There is been concerned about infection although cultures including one done last week were negative. Pending 3 weeks ago I gave her an empiric course of antibiotics. She is having a lot of rheumatologic pain in her hands with pain and stiffness. She wants to go on her weekly methotrexate and I think it would be reasonable to  do so. We have been using Iodoflex 08/01/18; difficult wound over her left olecranon process. She started back on methotrexate last week because of rheumatologic pain in her hands. We have been using Iodoflex to try and clean out the wound bed. She has been approved for Graphix PL 08/15/18; 2 week follow-up. Difficult wound over her left olecranon process. Graphix PL #1 with collagen backing 08/22/18; one-week follow-up. Difficult wound over her left olecranon process. Graphix PL #2 08/29/18; no major improvement. Difficult wound over her left olecranon process. Still considerable undermining. Graphics PL #3 1 week follow-up. Graphix #4 09/12/18 graphics #5. Some improvement in wound area although the undermining superiorly still has not closed down as much as I would like 09/19/18; Graphix #6 I think there is improvement in the undermining from 7 to 9:00. Wound bed looks healthy. Objective Constitutional Vitals Time Taken: 11:23 AM, Height: 60 in, Weight: 123 lbs, BMI: 24, Temperature: 97.9 F, Pulse: 71 bpm, Respiratory Rate: 18 breaths/min, Blood Pressure: 112/42 mmHg. Integumentary (Hair, Skin) Wound #9 status is Open. Original cause of wound was Trauma. The wound is located on the Left Elbow. The wound measures 0.6cm length x 0.5cm width x 0.2cm depth; 0.236cm^2 area and 0.047cm^3 volume. There is Fat Layer (Subcutaneous Tissue) Exposed exposed. There is no tunneling noted, however, there is undermining starting at 5:00 and ending at 9:00 with a maximum distance of 0.5cm. There is a medium amount of purulent drainage noted. The wound margin is distinct with the outline attached to the wound base. There is small (1-33%) red, pink granulation within the wound bed. There is a large (67-100%) amount of necrotic tissue within the wound bed including Eschar and Adherent Slough. The periwound skin appearance exhibited: Callus, Scarring, Maceration, Erythema. The periwound skin appearance did  not exhibit: Crepitus, Excoriation, Induration, Rash, Dry/Scaly, Atrophie Blanche, Cyanosis, Ecchymosis,  Hemosiderin Staining, Mottled, Pallor, Rubor. The surrounding wound skin color is noted with erythema which is circumferential. Periwound temperature was noted as No Abnormality. The periwound has tenderness on palpation. 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 A skin graft procedure using a bioengineered skin substitute/cellular or tissue based product was performed by Maxwell Caul, MD with the following instrument(s): Forceps and Scissors. Other was applied and secured with Steri-Strips. 6 sq cm of product was utilized and 0 sq cm was wasted. Post Application, mepitel one was applied. A Time Out was conducted at 11:56, prior to the start of the procedure. The procedure was tolerated well with a pain level of 0 throughout and a pain level of 0 following the procedure. Post procedure Diagnosis Wound #9: Same as Pre-Procedure . Plan Wound Cleansing: Wound #9 Left Elbow: Clean wound with Normal Saline. Anesthetic (add to Medication List): Wound #9 Left Elbow: Topical Lidocaine 4% cream applied to wound bed prior to debridement (In Clinic Only). Skin Barriers/Peri-Wound Care: Wound #9 Left Elbow: Skin Prep Dressing Change Frequency: Wound #9 Left Elbow: Change dressing every week - may change outer dressing if it gets soiled or wet Follow-up Appointments: Wound #9 Left Elbow: Return Appointment in 1 week. Advanced Therapies: Wound #9 Left Elbow: Grafix PL application in clinic; including contact layer, fixation with steri strips, dry gauze and cover dressing. graffix pl #6 continue Electronic  Signature(s) [name], [name] ([id number]) Signed: 09/19/2018 6:14:59 PM By: Baltazar Najjar MD Entered By: Baltazar Najjar on 09/19/2018 12:53:29 [name] ([id number]) -------------------------------------------------------------------------------- SuperBill Details Patient Name: [name] Date of Service: 09/19/2018 Medical Record Number: [id number] Patient Account Number: [id number] Date of Birth/Sex: March 27, 1930 (84 y.o. 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 Facility Procedures CPT4: Description Modifier Quantity Code [id number] 15271 - SKIN SUB GRAFT TRNK/ARM/LEG 1 ICD-10 Diagnosis Description S51.002S Unspecified open wound of left elbow, sequela L98.495 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  without evidence of necrosis CPT4: 32440102 [id number] Grafix PL 2x3 CM 6 Physician Procedures CPT4: Description Modifier Quantity Code [id number] 15271 - WC PHYS SKIN SUB GRAFT TRNK/ARM/LEG 1 ICD-10 Diagnosis Description S51.002S Unspecified open wound of left elbow, sequela L98.495 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  involvement without evidence of necrosis Electronic Signature(s) Signed: 09/19/2018 6:14:59 PM By: Baltazar Najjar MD Previous Signature: 09/19/2018 12:42:42 PM Version By: Elliot Gurney, BSN, RN, CWS, Kim RN, BSN Entered By: Baltazar Najjar on 09/19/2018 12:53:50

## 2018-09-22 NOTE — Progress Notes (Signed)
[name], KRETSCHMER ([id number]) Visit Report for 09/19/2018 Arrival Information Details Patient Name: [name], [name]. Date of Service: 09/19/2018 11:15 AM Medical Record Number: [id number] Patient Account Number: [id number] Date of Birth/Sex: [birth year] (84 y.o. F) Treating RN: Huel Coventry Primary Care Maciej Schweitzer: Aram Beecham Other Clinician: Referring Chandan Fly: Aram Beecham Treating Mafalda Mcginniss/Extender: Altamese [employer] in Treatment: 73 Visit Information History Since Last Visit Added or deleted any medications: No Patient Arrived: Walker Any new allergies or adverse reactions: No Arrival Time: 11:22 Had a fall or experienced change in No Accompanied By: self activities of daily living that may affect Transfer Assistance: None risk of falls: Patient Identification Verified: Yes Signs or symptoms of abuse/neglect since last visito No Secondary Verification Process Yes Hospitalized since last visit: No Completed: Implantable device outside of the clinic excluding No Patient Requires Transmission-Based No cellular tissue based products placed in the center Precautions: since last visit: Patient Has Alerts: Yes Has Dressing in Place as Prescribed: Yes Patient Alerts: non compressible left Pain Present Now: No leg Electronic Signature(s) Signed: 09/19/2018 4:51:34 PM By: Dayton Martes RCP, RRT, CHT Entered By: Dayton Martes on 09/19/2018 11:24:15 [name] ([id number]) -------------------------------------------------------------------------------- Encounter Discharge Information Details Patient Name: [name] Date of Service: 09/19/2018 11:15 AM Medical Record Number: [id number] Patient Account Number: [id number] Date of Birth/Sex: [birth year] (84 y.o. F) Treating RN: Huel Coventry Primary Care Nylan Nevel: Aram Beecham Other Clinician: Referring Mishka Stegemann: Aram Beecham Treating Avondre Richens/Extender: Altamese [place] in  Treatment: 89 Encounter Discharge Information Items Discharge Condition: Stable Ambulatory Status: Walker Discharge Destination: Home Transportation: Private Auto Accompanied By: self Schedule Follow-up Appointment: Yes Clinical Summary of Care: Post Procedure Vitals: Temperature (F): 97.2 Pulse (bpm): 71 Respiratory Rate (breaths/min): 16 Blood Pressure (mmHg): 112/64 Electronic Signature(s) Signed: 09/19/2018 5:34:51 PM By: Elliot Gurney, BSN, RN, CWS, Kim RN, BSN Entered By: Elliot Gurney, BSN, RN, CWS, Kim on 09/19/2018 12:15:38 [name] ([id number]) -------------------------------------------------------------------------------- Lower Extremity Assessment Details Patient Name: [name] Date of Service: 09/19/2018 11:15 AM Medical Record Number: [id number] Patient Account Number: [id number] Date of Birth/Sex: 13-Nov-1930 (84 y.o. F) Treating RN: Curtis Sites Primary Care Wenonah Milo: Aram Beecham Other Clinician: Referring Zeppelin Commisso: Aram Beecham Treating Tobyn Osgood/Extender: Altamese [place] in Treatment: 11 Electronic Signature(s) Signed: 09/19/2018 5:27:24 PM By: Curtis Sites Entered By: Curtis Sites on 09/19/2018 11:34:05 [name] ([id number]) -------------------------------------------------------------------------------- Multi Wound Chart Details Patient Name: [name] Date of Service: 09/19/2018 11:15 AM Medical Record Number: [id number] Patient Account Number: [id number] Date of Birth/Sex: 1930-11-29 (84 y.o. F) Treating RN: Huel Coventry Primary Care Eren Ryser: Aram Beecham Other Clinician: Referring Winford Hehn: Aram Beecham Treating Garlon Tuggle/Extender: Altamese [place] in Treatment: 29 Vital Signs Height(in): 60 Pulse(bpm): 71 Weight(lbs): 123 Blood Pressure(mmHg): 112/42 Body Mass Index(BMI): 24 Temperature(F): 97.9 Respiratory Rate 18 (breaths/min): Photos: [N/A:N/A] Wound Location: Left Elbow N/A N/A Wounding Event:  Trauma N/A N/A Primary Etiology: Trauma, Other N/A N/A Comorbid History: Cataracts, Glaucoma, Optic N/A N/A Neuritis, Chronic sinus problems/congestion, Middle ear problems Date Acquired: 01/26/2018 N/A N/A Weeks of Treatment: 20 N/A N/A Wound Status: Open N/A N/A Measurements L x W x D 0.6x0.5x0.2 N/A N/A (cm) Area (cm) : 0.236 N/A N/A Volume (cm) : 0.047 N/A N/A % Reduction in Area: -232.40% N/A N/A % Reduction in Volume: -235.70% N/A N/A Starting Position 1 5 (o'clock): Ending Position 1 9 (o'clock): Maximum Distance 1 (cm): 0.5 Undermining: Yes N/A N/A Classification: Full Thickness With Exposed N/A N/A Support Structures Exudate Amount: Medium N/A  N/A Exudate Type: Purulent N/A N/A Exudate Color: yellow, brown, green N/A N/A Wound Margin: Distinct, outline attached N/A N/A Granulation Amount: Small (1-33%) N/A N/A KIONDRA, CAICEDO ([id number]) Granulation Quality: Red, Pink N/A N/A Necrotic Amount: Large (67-100%) N/A N/A Necrotic Tissue: Eschar, Adherent Slough N/A N/A Exposed Structures: Fat Layer (Subcutaneous N/A N/A Tissue) Exposed: Yes Fascia: No Tendon: No Muscle: No Joint: No Bone: No Epithelialization: None N/A N/A Periwound Skin Texture: Callus: Yes N/A N/A Scarring: Yes Excoriation: No Induration: No Crepitus: No Rash: No Periwound Skin Moisture: Maceration: Yes N/A N/A Dry/Scaly: No Periwound Skin Color: Erythema: Yes N/A N/A Atrophie Blanche: No Cyanosis: No Ecchymosis: No Hemosiderin Staining: No Mottled: No Pallor: No Rubor: No Erythema Location: Circumferential N/A N/A Temperature: No Abnormality N/A N/A Tenderness on Palpation: Yes N/A N/A Wound Preparation: Ulcer Cleansing: N/A N/A Rinsed/Irrigated with Saline Topical Anesthetic Applied: Other: lidocaine 4% Procedures Performed: Cellular or Tissue Based N/A N/A Product Treatment Notes Wound #9 (Left Elbow) Notes grafix pl applied in clinic today, mepitel, steri strips,  and bordered foam Electronic Signature(s) Signed: 09/19/2018 6:14:59 PM By: Baltazar Najjar MD Entered By: Baltazar Najjar on 09/19/2018 12:47:17 [name] ([id number]) -------------------------------------------------------------------------------- Multi-Disciplinary Care Plan Details Patient Name: [name] Date of Service: 09/19/2018 11:15 AM Medical Record Number: [id number] Patient Account Number: [id number] Date of Birth/Sex: 1930-06-21 (84 y.o. F) Treating RN: Huel Coventry Primary Care Rhodie Cienfuegos: Aram Beecham Other Clinician: Referring Lora Chavers: Aram Beecham Treating Jamella Grayer/Extender: Altamese [place] in Treatment: 52 Active Inactive ` Soft Tissue Infection Nursing Diagnoses: Impaired tissue integrity Goals: Patient's soft tissue infection will resolve Date Initiated: 07/18/2018 Target Resolution Date: 07/27/2018 Goal Status: Active Interventions: Assess signs and symptoms of infection every visit Treatment Activities: Culture and sensitivity : 07/18/2018 Systemic antibiotics : 07/18/2018 Notes: ` Wound/Skin Impairment Nursing Diagnoses: Impaired tissue integrity Knowledge deficit related to ulceration/compromised skin integrity Goals: Patient/caregiver will verbalize understanding of skin care regimen Date Initiated: 11/14/2017 Target Resolution Date: 11/28/2017 Goal Status: Active Ulcer/skin breakdown will have a volume reduction of 30% by week 4 Date Initiated: 11/14/2017 Target Resolution Date: 11/28/2017 Goal Status: Active Interventions: Assess patient/caregiver ability to obtain necessary supplies Assess patient/caregiver ability to perform ulcer/skin care regimen upon admission and as needed Assess ulceration(s) every visit Treatment Activities: Skin care regimen initiated : 11/14/2017 DAVIE, SAGONA ([id number]) Notes: Electronic Signature(s) Signed: 09/19/2018 5:34:51 PM By: Elliot Gurney, BSN, RN, CWS, Kim RN, BSN Entered By: Elliot Gurney, BSN, RN,  CWS, Kim on 09/19/2018 11:54:20 [name] ([id number]) -------------------------------------------------------------------------------- Pain Assessment Details Patient Name: [name] Date of Service: 09/19/2018 11:15 AM Medical Record Number: [id number] Patient Account Number: [id number] Date of Birth/Sex: 10-Feb-1930 (84 y.o. F) Treating RN: Huel Coventry Primary Care Brynley Cuddeback: Aram Beecham Other Clinician: Referring Javonna Balli: Aram Beecham Treating Tyden Kann/Extender: Altamese [place] in Treatment: 10 Active Problems Location of Pain Severity and Description of Pain Patient Has Paino No Site Locations Pain Management and Medication Current Pain Management: Electronic Signature(s) Signed: 09/19/2018 4:51:34 PM By: Dayton Martes RCP, RRT, CHT Signed: 09/19/2018 5:34:51 PM By: Elliot Gurney, BSN, RN, CWS, Kim RN, BSN Entered By: Dayton Martes on 09/19/2018 11:24:02 [name] ([id number]) -------------------------------------------------------------------------------- Patient/Caregiver Education Details Patient Name: [name] Date of Service: 09/19/2018 11:15 AM Medical Record Number: [id number] Patient Account Number: [id number] Date of Birth/Gender: [birth year] (84 y.o. F) Treating RN: Huel Coventry Primary Care Physician: Aram Beecham Other Clinician: Referring Physician: Aram Beecham Treating Physician/Extender: Altamese  in Treatment: 33 Education Assessment  Education Provided To: Patient Education Topics Provided Wound/Skin Impairment: Handouts: Caring for Your Ulcer Methods: Demonstration, Explain/Verbal Responses: State content correctly Electronic Signature(s) Signed: 09/19/2018 5:34:51 PM By: Elliot Gurney, BSN, RN, CWS, Kim RN, BSN Entered By: Elliot Gurney, BSN, RN, CWS, Kim on 09/19/2018 12:14:30 [name] ([id number]) -------------------------------------------------------------------------------- Wound  Assessment Details Patient Name: [name] Date of Service: 09/19/2018 11:15 AM Medical Record Number: [id number] Patient Account Number: [id number] Date of Birth/Sex: 1930-01-14 (84 y.o. F) Treating RN: Curtis Sites Primary Care Breunna Nordmann: Aram Beecham Other Clinician: Referring Oumar Marcott: Aram Beecham Treating Sandrine Bloodsworth/Extender: Altamese [place] in Treatment: 44 Wound Status Wound Number: 9 Primary Trauma, Other Etiology: Wound Location: Left Elbow Wound Open Wounding Event: Trauma Status: Date Acquired: 01/26/2018 Comorbid Cataracts, Glaucoma, Optic Neuritis, Chronic Weeks Of Treatment: 20 History: sinus problems/congestion, Middle ear problems Clustered Wound: No Photos Photo Uploaded By: Rema [name] on 09/19/2018 11:43:44 Wound Measurements Length: (cm) 0.6 Width: (cm) 0.5 Depth: (cm) 0.2 Area: (cm) 0.236 Volume: (cm) 0.047 % Reduction in Area: -232.4% % Reduction in Volume: -235.7% Epithelialization: None Tunneling: No Undermining: Yes Starting Position (o'clock): 5 Ending Position (o'clock): 9 Maximum Distance: (cm) 0.5 Wound Description Full Thickness With Exposed Support Foul Odo Classification: Structures Slough/F Wound Margin: Distinct, outline attached Exudate Medium Amount: Exudate Type: Purulent Exudate Color: yellow, brown, green r After Cleansing: No ibrino Yes Wound Bed Granulation Amount: Small (1-33%) Exposed Structure Granulation Quality: Red, Pink Fascia Exposed: No Necrotic Amount: Large (67-100%) Fat Layer (Subcutaneous Tissue) Exposed: Yes DEMETRICA, ZIPP ([id number]) Necrotic Quality: Eschar, Adherent Slough Tendon Exposed: No Muscle Exposed: No Joint Exposed: No Bone Exposed: No Periwound Skin Texture Texture Color No Abnormalities Noted: No No Abnormalities Noted: No Callus: Yes Atrophie Blanche: No Crepitus: No Cyanosis: No Excoriation: No Ecchymosis: No Induration: No Erythema: Yes Rash:  No Erythema Location: Circumferential Scarring: Yes Hemosiderin Staining: No Mottled: No Moisture Pallor: No No Abnormalities Noted: No Rubor: No Dry / Scaly: No Maceration: Yes Temperature / Pain Temperature: No Abnormality Tenderness on Palpation: Yes Wound Preparation Ulcer Cleansing: Rinsed/Irrigated with Saline Topical Anesthetic Applied: Other: lidocaine 4%, Treatment Notes Wound #9 (Left Elbow) Notes grafix pl applied in clinic today, mepitel, steri strips, and bordered foam Electronic Signature(s) Signed: 09/19/2018 5:27:24 PM By: Curtis Sites Entered By: Curtis Sites on 09/19/2018 11:39:47 [name] ([id number]) -------------------------------------------------------------------------------- Vitals Details Patient Name: [name] Date of Service: 09/19/2018 11:15 AM Medical Record Number: [id number] Patient Account Number: [id number] Date of Birth/Sex: June 30, 1930 (84 y.o. F) Treating RN: Huel Coventry Primary Care Shylin Keizer: Aram Beecham Other Clinician: Referring Naif Alabi: Aram Beecham Treating Lynk Marti/Extender: Altamese [place] in Treatment: 42 Vital Signs Time Taken: 11:23 Temperature (F): 97.9 Height (in): 60 Pulse (bpm): 71 Weight (lbs): 123 Respiratory Rate (breaths/min): 18 Body Mass Index (BMI): 24 Blood Pressure (mmHg): 112/42 Reference Range: 80 - 120 mg / dl Electronic Signature(s) Signed: 09/19/2018 4:51:34 PM By: Dayton Martes RCP, RRT, CHT Entered By: Dayton Martes on 09/19/2018 11:27:47

## 2018-09-24 ENCOUNTER — Ambulatory Visit: Payer: Medicare Other

## 2018-09-24 DIAGNOSIS — R2681 Unsteadiness on feet: Secondary | ICD-10-CM

## 2018-09-24 DIAGNOSIS — Z9181 History of falling: Secondary | ICD-10-CM

## 2018-09-24 DIAGNOSIS — R262 Difficulty in walking, not elsewhere classified: Secondary | ICD-10-CM

## 2018-09-24 DIAGNOSIS — M6281 Muscle weakness (generalized): Secondary | ICD-10-CM

## 2018-09-24 NOTE — Therapy (Signed)
[facility] [address], [place], [address] Phone: [phone]   Fax:  [phone]  Physical Therapy Treatment  Patient Details  Name: [name] MRN: [id number] Date of Birth: 07/07/1930 Referring Provider (PT): Cristopher Peru, MD   Encounter Date: 09/24/2018  PT End of Session - 09/24/18 1525    Visit Number  6    Number of Visits  17    Date for PT Re-Evaluation  11/01/18    Authorization Type  6    Authorization Time Period  of 10 progress report    PT Start Time  1526   pt arrived late   PT Stop Time  1559    PT Time Calculation (min)  33 min    Equipment Utilized During Treatment  Gait belt    Activity Tolerance  Patient tolerated treatment well    Behavior During Therapy  WFL for tasks assessed/performed       Past Medical History:  Diagnosis Date  . Anemia   . GERD (gastroesophageal reflux disease)   . Hypertension   . Peripheral vascular disease (HCC)    possible neuropathies in lower extremeties  . PONV (postoperative nausea and vomiting)    happens sometimes but better with pre med of zofran  . Presence of permanent cardiac pacemaker   . Syncope     Past Surgical History:  Procedure Laterality Date  . ABDOMINAL HYSTERECTOMY  1969  . APPENDECTOMY  1969   with hysterectomy  . BACK SURGERY  2001   rods in back. surgery on back x 3  . COLONOSCOPY    . I&D EXTREMITY Left 03/27/2018   Procedure: IRRIGATION AND DEBRIDEMENT LEFT ELBOW / OLECRANON BURSA;  Surgeon: Christena Flake, MD;  Location: [facility] ORS;  Service: Orthopedics;  Laterality: Left;  . I&D EXTREMITY Left 06/05/2018   Procedure: IRRIGATION AND DEBRIDEMENT EXTREMITY;  Surgeon: Christena Flake, MD;  Location: [facility] ORS;  Service: Orthopedics;  Laterality: Left;  . INSERT / REPLACE / REMOVE PACEMAKER  2004  . JOINT REPLACEMENT Left 2004   partial knee replacement  . OLECRANON BURSECTOMY Left 03/27/2018   Procedure: LEFT OLECRANON  BURSA;  Surgeon: Christena Flake, MD;  Location: [facility] ORS;  Service: Orthopedics;  Laterality: Left;  . PACEMAKER INSERTION  2014   dual chamber for complete heart block    There were no vitals filed for this visit.  Subjective Assessment - 09/24/18 1528    Subjective  Balance feels awful today. Knees felt like mush this morning.     Pertinent History  Imparied gait. Pt states that she has had balance issues for too long. Feels more comfortable walking with her rw. Had tests which showed that she has neuropathy. However the doctor at [facility] said that her balance is coming from her back. Just wants to be strong so she can walk.  Started walking with her rw off and on for the past several months.  Also feels like she might need a new sleep number bed. Also had 2 wound surgeries at [name]. Pt fell in February 2019. Had to stop PT due to the L elbow surgery April 06, 2018 to remove a bursa. The wound still has not healed.   Currently sees a wound doctor.   Feels numbness L foot from the ankle down.  No R foot numbness.  Sometimes unsure of where her L LE is in space.  Pt also states that  she tires too quickly.  Has not been able to walk her dog in 2 years. Has not really been doing exercises at home except the sit <> stand. Pt states that prolonged sitting without back support will increase back pain     Limitations  Standing;Walking    How long can you sit comfortably?  1 hr    How long can you stand comfortably?  10 minutes    How long can you walk comfortably?  2 or 3 blocks    Diagnostic tests  EMG testing BLEs and BUEs which were negative    Patient Stated Goals  Improve LE strength. Wants to use the machines at the gym. Walk more steadily. Be able to take her dog (16 lbs) out on a leash.     Currently in Pain?  No/denies    Pain Score  0-No pain    Pain Onset  More than a month ago                               PT Education - 09/24/18 1541    Education  provided  Yes    Education Details  ther-ex    [employer]) Educated  Patient    Methods  Explanation;Demonstration;Tactile cues;Verbal cues    Comprehension  Returned demonstration;Verbalized understanding          Objectives  Pt ambulating with rw  TTP bilateral greater trochanter L >R   L elbow wound    Ther-ex  CGA with standing activities.   Sit <> stand from regular chair with B UE assist 5x3. Mod to max cues for placing and maintaining her body weight over her base of support.   Standing forward weight shifting 10x3 with 5 seconds holds   Forward step up onto Air Ex pad with one UE assist  R 10x  L 10x2. More difficulty compared to R LE    Side stepping with bilateral UE assist 5 ft to the R and 5 ft to the L for 6x   Gait forward with one UE assist 10x Gait backward with one UE assist 10x   Improved exercise technique, movement at target joints, use of target muscles after min to mod verbal, visual, tactile cues.    Continued working on LE strengthening to promote ability to support herself when performing standing tasks to help decrease fall risk. Pt also needed consistent cues to place and maintain her center of gravity over her base of support when performing standing tasks, especially when stepping onto Air Ex pad with L LE.      PT Short Term Goals - 09/06/18 2136      PT SHORT TERM GOAL #1   Title  Patient will be independent with her HEP to improve strength and balance.     Time  3    Period  Weeks    Status  New    Target Date  09/27/18        PT Long Term Goals - 09/06/18 1836      PT LONG TERM GOAL #1   Title  Patient will improve her 10 MWT to 0.8 m/s or more with rw to promote better community ambulation.     Baseline  0.5 seconds with rw (09/06/2018)    Time  8    Period  Weeks    Status  New    Target Date  11/01/18  PT LONG TERM GOAL #2   Title  Patient will improve her DGI score using rw or least restrictive AD  to 19/24 or more to promote balance.     Baseline  13/24 using rw (09/06/2018)    Time  8    Period  Weeks    Status  New    Target Date  11/01/18      PT LONG TERM GOAL #3   Title  Patient will improve bilateral LE strength by at least 1/2 MMT to promote ability to support herself when performing standing tasks and improve balance.     Time  8    Period  Weeks    Status  New    Target Date  11/01/18      PT LONG TERM GOAL #4   Title  Patient will have a decrease in back pain/ache to 5/10 or less to promote ability to perform standing tasks with less difficulty.    Baseline  8/10 at most for the past 3 months (09/06/2018)    Time  8    Period  Weeks    Status  New    Target Date  11/01/18            Plan - 09/24/18 1603    Clinical Impression Statement  Continued working on LE strengthening to promote ability to support herself when performing standing tasks to help decrease fall risk. Pt also needed consistent cues to place and maintain her center of gravity over her base of support when performing standing tasks, especially when stepping onto Air Ex pad with L LE.     Rehab Potential  Fair    Clinical Impairments Affecting Rehab Potential  (-) chronicity of condition, age, comorbidities; (+) motivated    PT Frequency  2x / week    PT Duration  8 weeks    PT Treatment/Interventions  Aquatic Therapy;Gait training;Therapeutic activities;Therapeutic exercise;Balance training;Neuromuscular re-education;Patient/family education;Manual techniques;Dry needling    PT Next Visit Plan  trunk, hip, scapular strengthening, balance, gait, manual techniques PRN    PT Home Exercise Plan  Sit<>stand, standing hip ext and abd, bridging, and heel raises    Consulted and Agree with Plan of Care  Patient       Patient will benefit from skilled therapeutic intervention in order to improve the following deficits and impairments:  Abnormal gait, Decreased activity tolerance, Decreased balance,  Decreased endurance, Decreased range of motion, Decreased strength, Difficulty walking, Improper body mechanics, Postural dysfunction, Pain, Decreased knowledge of use of DME  Visit Diagnosis: Unsteadiness on feet  History of falling  Muscle weakness (generalized)  Difficulty in walking, not elsewhere classified     Problem List Patient Active Problem List   Diagnosis Date Noted  . Septic olecranon bursitis of left elbow 03/27/2018  . Lymphedema 10/11/2017  . Chronic venous insufficiency 10/11/2017  . Leg pain 10/11/2017  . Leg swelling 10/11/2017  . COPD (chronic obstructive pulmonary disease) (HCC) 10/11/2017  . Essential hypertension 10/11/2017    Loralyn Freshwater PT, DPT   09/24/2018, 6:42 PM  [facility] [address] [place], [address] Phone: [phone]   Fax:  [phone]  Name: LOGYN DEDOMINICIS MRN: [id number] Date of Birth: 02/08/1930

## 2018-09-26 ENCOUNTER — Encounter: Payer: Medicare Other | Admitting: Internal Medicine

## 2018-09-26 ENCOUNTER — Ambulatory Visit: Payer: Medicare Other

## 2018-09-26 DIAGNOSIS — G8929 Other chronic pain: Secondary | ICD-10-CM

## 2018-09-26 DIAGNOSIS — M545 Low back pain, unspecified: Secondary | ICD-10-CM

## 2018-09-26 DIAGNOSIS — M6281 Muscle weakness (generalized): Secondary | ICD-10-CM

## 2018-09-26 DIAGNOSIS — S51002A Unspecified open wound of left elbow, initial encounter: Secondary | ICD-10-CM | POA: Diagnosis not present

## 2018-09-26 DIAGNOSIS — R262 Difficulty in walking, not elsewhere classified: Secondary | ICD-10-CM

## 2018-09-26 DIAGNOSIS — R2681 Unsteadiness on feet: Secondary | ICD-10-CM

## 2018-09-26 DIAGNOSIS — Z9181 History of falling: Secondary | ICD-10-CM

## 2018-09-26 NOTE — Therapy (Signed)
[facility] [address] [place], [address] Phone: [phone]   Fax:  [phone]  Physical Therapy Treatment  Patient Details  Name: [name] MRN: [id number] Date of Birth: July 18, 1930 Referring Provider (PT): Cristopher Peru, MD   Encounter Date: 09/26/2018  PT End of Session - 09/26/18 1413    Visit Number  7    Number of Visits  17    Date for PT Re-Evaluation  11/01/18    Authorization Type  7    Authorization Time Period  of 10 progress report    PT Start Time  1413    PT Stop Time  1457    PT Time Calculation (min)  44 min    Equipment Utilized During Treatment  Gait belt    Activity Tolerance  Patient tolerated treatment well    Behavior During Therapy  [facility] for tasks assessed/performed       Past Medical History:  Diagnosis Date  . Anemia   . GERD (gastroesophageal reflux disease)   . Hypertension   . Peripheral vascular disease (HCC)    possible neuropathies in lower extremeties  . PONV (postoperative nausea and vomiting)    happens sometimes but better with pre med of zofran  . Presence of permanent cardiac pacemaker   . Syncope     Past Surgical History:  Procedure Laterality Date  . ABDOMINAL HYSTERECTOMY  1969  . APPENDECTOMY  1969   with hysterectomy  . BACK SURGERY  2001   rods in back. surgery on back x 3  . COLONOSCOPY    . I&D EXTREMITY Left 03/27/2018   Procedure: IRRIGATION AND DEBRIDEMENT LEFT ELBOW / OLECRANON BURSA;  Surgeon: Christena Flake, MD;  Location: [facility] ORS;  Service: Orthopedics;  Laterality: Left;  . I&D EXTREMITY Left 06/05/2018   Procedure: IRRIGATION AND DEBRIDEMENT EXTREMITY;  Surgeon: Christena Flake, MD;  Location: [facility] ORS;  Service: Orthopedics;  Laterality: Left;  . INSERT / REPLACE / REMOVE PACEMAKER  2004  . JOINT REPLACEMENT Left 2004   partial knee replacement  . OLECRANON BURSECTOMY Left 03/27/2018   Procedure: LEFT OLECRANON BURSA;  Surgeon:  Christena Flake, MD;  Location: [facility] ORS;  Service: Orthopedics;  Laterality: Left;  . PACEMAKER INSERTION  2014   dual chamber for complete heart block    There were no vitals filed for this visit.  Subjective Assessment - 09/26/18 1417    Subjective  Frustrated with her appointment at the wound center this morning due to [name] wound not healing for the past 7 months. Back is ok. Not bothering her today.     Pertinent History  Imparied gait. Pt states that she has had balance issues for too long. Feels more comfortable walking with her rw. Had tests which showed that she has neuropathy. However the doctor at [facility] said that her balance is coming from her back. Just wants to be strong so she can walk.  Started walking with her rw off and on for the past several months.  Also feels like she might need a new sleep number bed. Also had 2 wound surgeries at [name]. Pt fell in February 2019. Had to stop PT due to the L elbow surgery April 06, 2018 to remove a bursa. The wound still has not healed.   Currently sees a wound doctor.   Feels numbness L foot from the ankle down.  No R foot numbness.  Sometimes unsure of where her L LE is in space.  Pt also states that she tires too quickly.  Has not been able to walk her dog in 2 years. Has not really been doing exercises at home except the sit <> stand. Pt states that prolonged sitting without back support will increase back pain     Limitations  Standing;Walking    How long can you sit comfortably?  1 hr    How long can you stand comfortably?  10 minutes    How long can you walk comfortably?  2 or 3 blocks    Diagnostic tests  EMG testing BLEs and BUEs which were negative    Patient Stated Goals  Improve LE strength. Wants to use the machines at the gym. Walk more steadily. Be able to take her dog (16 lbs) out on a leash.     Currently in Pain?  No/denies    Pain Score  0-No pain    Pain Onset  More than a month ago                                PT Education - 09/26/18 1435    Education provided  Yes    Education Details  ther-ex    [employer]) Educated  Patient    Methods  Explanation;Demonstration;Tactile cues;Verbal cues    Comprehension  Returned demonstration;Verbalized understanding      Objectives  Pt ambulating with rw  TTP bilateral greater trochanter L >R   L elbow wound    Ther-ex  CGA with standing activities.    Sit <> stand from regular chair with B UE assist 5x3. Min to mod cues for placing and maintaining her body weight over her base of support and for femoral control   Standing LE leg press with bilateral UE assist, resisting blue band 10x3 each LE. More difficulty with R LE compared to L (L LE weight bearing with R LE leg press.   Forward step up onto Air ex pad with B UE assist 10x2 each LE  Standing alternating toe taps onto treadmill platform with B UE assist. 10x each LE Then with 2 finger touch assist 10x2 each LE  Stepping over Air Ex beam with one UE assist 5x each LE. Cues for foot clearance  Then 2x each LE. Light headedness.  BP R arm sitting, mechanically taken, normal cuff: 118/49, HR 80   Seated ankle DF/PF 10x3 each way. No lightheadedness afterwards  Standing glute max squeeze 10x5 seconds for 3 sets   Improved exercise technique, movement at target joints, use of target muscles after min to mod verbal, visual, tactile cues.    Continued working on LE strengthening, especially with L LE secondary to L LE weakness during single stance phases of standing activities > R LE. Improving femoral control and forward weight shifting onto her feet with sit <> stand transfers. Dizziness with stepping over Air Ex beam exercise, probably due to low blood pressure levels and pt having to turn around after each rep to continue exercise due to using only 1 beam to step over. Dizziness disappeared with sitting as well as with  exercises to promote musculovenous pump to circulate blood in her circulatory system. No complain of back pain throughout session. Pt will benefit from continued skilled physical therapy services to improve LE strength, balance, and decrease fall risk.         PT Short  Term Goals - 09/06/18 2136      PT SHORT TERM GOAL #1   Title  Patient will be independent with her HEP to improve strength and balance.     Time  3    Period  Weeks    Status  New    Target Date  09/27/18        PT Long Term Goals - 09/06/18 1836      PT LONG TERM GOAL #1   Title  Patient will improve her 10 MWT to 0.8 m/s or more with rw to promote better community ambulation.     Baseline  0.5 seconds with rw (09/06/2018)    Time  8    Period  Weeks    Status  New    Target Date  11/01/18      PT LONG TERM GOAL #2   Title  Patient will improve her DGI score using rw or least restrictive AD to 19/24 or more to promote balance.     Baseline  13/24 using rw (09/06/2018)    Time  8    Period  Weeks    Status  New    Target Date  11/01/18      PT LONG TERM GOAL #3   Title  Patient will improve bilateral LE strength by at least 1/2 MMT to promote ability to support herself when performing standing tasks and improve balance.     Time  8    Period  Weeks    Status  New    Target Date  11/01/18      PT LONG TERM GOAL #4   Title  Patient will have a decrease in back pain/ache to 5/10 or less to promote ability to perform standing tasks with less difficulty.    Baseline  8/10 at most for the past 3 months (09/06/2018)    Time  8    Period  Weeks    Status  New    Target Date  11/01/18            Plan - 09/26/18 1411    Clinical Impression Statement  Continued working on LE strengthening, especially with L LE secondary to L LE weakness during single stance phases of standing activities > R LE. Improving femoral control and forward weight shifting onto her feet with sit <> stand transfers. Dizziness with  stepping over Air Ex beam exercise, probably due to low blood pressure levels and pt having to turn around after each rep to continue exercise due to using only 1 beam to step over. Dizziness disappeared with sitting as well as with exercises to promote musculovenous pump to circulate blood in her circulatory system. No complain of back pain throughout session. Pt will benefit from continued skilled physical therapy services to improve LE strength, balance, and decrease fall risk.    Rehab Potential  Fair    Clinical Impairments Affecting Rehab Potential  (-) chronicity of condition, age, comorbidities; (+) motivated    PT Frequency  2x / week    PT Duration  8 weeks    PT Treatment/Interventions  Aquatic Therapy;Gait training;Therapeutic activities;Therapeutic exercise;Balance training;Neuromuscular re-education;Patient/family education;Manual techniques;Dry needling    PT Next Visit Plan  trunk, hip, scapular strengthening, balance, gait, manual techniques PRN    PT Home Exercise Plan  Sit<>stand, standing hip ext and abd, bridging, and heel raises    Consulted and Agree with Plan of Care  Patient       Patient  will benefit from skilled therapeutic intervention in order to improve the following deficits and impairments:  Abnormal gait, Decreased activity tolerance, Decreased balance, Decreased endurance, Decreased range of motion, Decreased strength, Difficulty walking, Improper body mechanics, Postural dysfunction, Pain, Decreased knowledge of use of DME  Visit Diagnosis: Unsteadiness on feet  History of falling  Muscle weakness (generalized)  Difficulty in walking, not elsewhere classified  Chronic low back pain, unspecified back pain laterality, unspecified whether sciatica present     Problem List Patient Active Problem List   Diagnosis Date Noted  . Septic olecranon bursitis of left elbow 03/27/2018  . Lymphedema 10/11/2017  . Chronic venous insufficiency 10/11/2017  . Leg  pain 10/11/2017  . Leg swelling 10/11/2017  . COPD (chronic obstructive pulmonary disease) (HCC) 10/11/2017  . Essential hypertension 10/11/2017   Loralyn Freshwater PT, DPT   09/26/2018, 3:08 PM  [facility] PHYSICAL AND SPORTS MEDICINE [address], [place], [address] Phone: [phone]   Fax:  [phone]  Name: ALIVIYA SCHOELLER MRN: [id number] Date of Birth: [birth year]

## 2018-09-28 NOTE — Progress Notes (Signed)
MILY, MALECKI ([id number]) Visit Report for 09/26/2018 Arrival Information Details Patient Name: [name], [name] Date of Service: 09/26/2018 11:15 AM Medical Record Number: [id number] Patient Account Number: [id number] Date of Birth/Sex: 07-23-1930 (84 y.o. F) Treating RN: Huel Coventry Primary Care Jannah Guardiola: Aram Beecham Other Clinician: Referring Frank Pilger: Aram Beecham Treating Carrissa Taitano/Extender: Altamese [place] in Treatment: 45 Visit Information History Since Last Visit Added or deleted any medications: No Patient Arrived: Walker Any new allergies or adverse reactions: No Arrival Time: 11:17 Had a fall or experienced change in No Accompanied By: self activities of daily living that may affect Transfer Assistance: None risk of falls: Patient Identification Verified: Yes Signs or symptoms of abuse/neglect since last visito No Secondary Verification Process Yes Hospitalized since last visit: No Completed: Implantable device outside of the clinic excluding No Patient Requires Transmission-Based No cellular tissue based products placed in the center Precautions: since last visit: Patient Has Alerts: Yes Has Dressing in Place as Prescribed: Yes Patient Alerts: non compressible left Pain Present Now: No leg Electronic Signature(s) Signed: 09/26/2018 11:25:30 AM By: Dayton Martes RCP, RRT, CHT Entered By: Dayton Martes on 09/26/2018 11:17:56 [name] ([id number]) -------------------------------------------------------------------------------- Lower Extremity Assessment Details Patient Name: [name] Date of Service: 09/26/2018 11:15 AM Medical Record Number: [id number] Patient Account Number: [id number] Date of Birth/Sex: Feb 05, 1930 (84 y.o. F) Treating RN: Rema [name] Primary Care Janie Strothman: Aram Beecham Other Clinician: Referring Abrahan Fulmore: Aram Beecham Treating Jonnelle Lawniczak/Extender: Altamese [place] in  Treatment: 45 Electronic Signature(s) Signed: 09/26/2018 4:03:08 PM By: Rema [name] Entered By: Rema [name] on 09/26/2018 11:27:25 [name] ([id number]) -------------------------------------------------------------------------------- Multi Wound Chart Details Patient Name: [name] Date of Service: 09/26/2018 11:15 AM Medical Record Number: [id number] Patient Account Number: [id number] Date of Birth/Sex: November 09, 1930 (84 y.o. F) Treating RN: Huel Coventry Primary Care Shloime Keilman: Aram Beecham Other Clinician: Referring Kellyanne Ellwanger: Aram Beecham Treating Jesiel Garate/Extender: Altamese [place] in Treatment: 45 Vital Signs Height(in): 60 Pulse(bpm): 68 Weight(lbs): 123 Blood Pressure(mmHg): 122/52 Body Mass Index(BMI): 24 Temperature(F): 98 Respiratory Rate 18 (breaths/min): Photos: [N/A:N/A] Wound Location: Left Elbow N/A N/A Wounding Event: Trauma N/A N/A Primary Etiology: Trauma, Other N/A N/A Comorbid History: Cataracts, Glaucoma, Optic N/A N/A Neuritis, Chronic sinus problems/congestion, Middle ear problems Date Acquired: 01/26/2018 N/A N/A Weeks of Treatment: 21 N/A N/A Wound Status: Open N/A N/A Measurements L x W x D 0.6x0.5x0.2 N/A N/A (cm) Area (cm) : 0.236 N/A N/A Volume (cm) : 0.047 N/A N/A % Reduction in Area: -232.40% N/A N/A % Reduction in Volume: -235.70% N/A N/A Starting Position 1 6 (o'clock): Ending Position 1 11 (o'clock): Maximum Distance 1 (cm): 0.5 Undermining: Yes N/A N/A Classification: Full Thickness With Exposed N/A N/A Support Structures Exudate Amount: Small N/A N/A Exudate Type: Purulent N/A N/A Exudate Color: yellow, brown, green N/A N/A Wound Margin: Distinct, outline attached N/A N/A Granulation Amount: None Present (0%) N/A N/A [name], [name] ([id number]) Necrotic Amount: Large (67-100%) N/A N/A Necrotic Tissue: Eschar, Adherent Slough N/A N/A Exposed Structures: Fat Layer (Subcutaneous N/A N/A Tissue) Exposed:  Yes Fascia: No Tendon: No Muscle: No Joint: No Bone: No Epithelialization: None N/A N/A Periwound Skin Texture: Callus: Yes N/A N/A Scarring: Yes Excoriation: No Induration: No Crepitus: No Rash: No Periwound Skin Moisture: Maceration: Yes N/A N/A Dry/Scaly: No Periwound Skin Color: Erythema: Yes N/A N/A Atrophie Blanche: No Cyanosis: No Ecchymosis: No Hemosiderin Staining: No Mottled: No Pallor: No Rubor: No Erythema Location: Circumferential N/A N/A Temperature: No Abnormality N/A N/A  Tenderness on Palpation: Yes N/A N/A Wound Preparation: Ulcer Cleansing: N/A N/A Rinsed/Irrigated with Saline Topical Anesthetic Applied: Other: lidocaine 4% Procedures Performed: Cellular or Tissue Based N/A N/A Product Treatment Notes Electronic Signature(s) Signed: 09/26/2018 4:50:13 PM By: Baltazar Najjar MD Entered By: Baltazar Najjar on 09/26/2018 12:04:09 [name] ([id number]) -------------------------------------------------------------------------------- Multi-Disciplinary Care Plan Details Patient Name: [name] Date of Service: 09/26/2018 11:15 AM Medical Record Number: [id number] Patient Account Number: [id number] Date of Birth/Sex: 1930/09/19 (84 y.o. F) Treating RN: Huel Coventry Primary Care Jayley Hustead: Aram Beecham Other Clinician: Referring Makyle Eslick: Aram Beecham Treating Lauralee Waters/Extender: Altamese [place] in Treatment: 32 Active Inactive ` Soft Tissue Infection Nursing Diagnoses: Impaired tissue integrity Goals: Patient's soft tissue infection will resolve Date Initiated: 07/18/2018 Target Resolution Date: 07/27/2018 Goal Status: Active Interventions: Assess signs and symptoms of infection every visit Treatment Activities: Culture and sensitivity : 07/18/2018 Systemic antibiotics : 07/18/2018 Notes: ` Wound/Skin Impairment Nursing Diagnoses: Impaired tissue integrity Knowledge deficit related to ulceration/compromised skin  integrity Goals: Patient/caregiver will verbalize understanding of skin care regimen Date Initiated: 11/14/2017 Target Resolution Date: 11/28/2017 Goal Status: Active Ulcer/skin breakdown will have a volume reduction of 30% by week 4 Date Initiated: 11/14/2017 Target Resolution Date: 11/28/2017 Goal Status: Active Interventions: Assess patient/caregiver ability to obtain necessary supplies Assess patient/caregiver ability to perform ulcer/skin care regimen upon admission and as needed Assess ulceration(s) every visit Treatment Activities: Skin care regimen initiated : 11/14/2017 [name], [name] ([id number]) Notes: Electronic Signature(s) Signed: 09/26/2018 5:17:02 PM By: Elliot Gurney, BSN, RN, CWS, Kim RN, BSN Entered By: Elliot Gurney, BSN, RN, CWS, Kim on 09/26/2018 11:37:05 [name] ([id number]) -------------------------------------------------------------------------------- Pain Assessment Details Patient Name: [name] Date of Service: 09/26/2018 11:15 AM Medical Record Number: [id number] Patient Account Number: [id number] Date of Birth/Sex: 06-25-1930 (84 y.o. F) Treating RN: Huel Coventry Primary Care Bernardina Cacho: Aram Beecham Other Clinician: Referring Rajni Holsworth: Aram Beecham Treating Quinetta Shilling/Extender: Altamese [place] in Treatment: 92 Active Problems Location of Pain Severity and Description of Pain Patient Has Paino No Site Locations Pain Management and Medication Current Pain Management: Electronic Signature(s) Signed: 09/26/2018 11:25:30 AM By: Dayton Martes RCP, RRT, CHT Signed: 09/26/2018 5:17:02 PM By: Elliot Gurney, BSN, RN, CWS, Kim RN, BSN Entered By: Dayton Martes on 09/26/2018 11:18:04 [name] ([id number]) -------------------------------------------------------------------------------- Patient/Caregiver Education Details Patient Name: [name] Date of Service: 09/26/2018 11:15 AM Medical Record Number:  [id number] Patient Account Number: [id number] Date of Birth/Gender: 1930/10/12 (84 y.o. F) Treating RN: Huel Coventry Primary Care Physician: Aram Beecham Other Clinician: Referring Physician: Aram Beecham Treating Physician/Extender: Altamese [place] in Treatment: 26 Education Assessment Education Provided To: Patient Education Topics Provided Wound/Skin Impairment: Handouts: Caring for Your Ulcer Methods: Demonstration, Explain/Verbal Responses: State content correctly Electronic Signature(s) Signed: 09/26/2018 5:17:02 PM By: Elliot Gurney, BSN, RN, CWS, Kim RN, BSN Entered By: Elliot Gurney, BSN, RN, CWS, Kim on 09/26/2018 11:51:05 [name] ([id number]) -------------------------------------------------------------------------------- Wound Assessment Details Patient Name: [name] Date of Service: 09/26/2018 11:15 AM Medical Record Number: [id number] Patient Account Number: [id number] Date of Birth/Sex: Oct 08, 1930 (84 y.o. F) Treating RN: Rema [name] Primary Care Jermesha Sottile: Aram Beecham Other Clinician: Referring Ursula Dermody: Aram Beecham Treating Shondell Fabel/Extender: Altamese [place] in Treatment: 45 Wound Status Wound Number: 9 Primary Trauma, Other Etiology: Wound Location: Left Elbow Wound Open Wounding Event: Trauma Status: Date Acquired: 01/26/2018 Comorbid Cataracts, Glaucoma, Optic Neuritis, Chronic Weeks Of Treatment: 21 History: sinus problems/congestion, Middle ear problems Clustered Wound: No Photos Wound Measurements Length: (cm)  0.6 Width: (cm) 0.5 Depth: (cm) 0.2 Area: (cm) 0.236 Volume: (cm) 0.047 % Reduction in Area: -232.4% % Reduction in Volume: -235.7% Epithelialization: None Tunneling: No Undermining: Yes Starting Position (o'clock): 6 Ending Position (o'clock): 11 Maximum Distance: (cm) 0.5 Wound Description Full Thickness With Exposed Support Foul Odo Classification: Structures Slough/F Wound Margin: Distinct, outline  attached Exudate Small Amount: Exudate Type: Purulent Exudate Color: yellow, brown, green r After Cleansing: No ibrino Yes Wound Bed Granulation Amount: None Present (0%) Exposed Structure Necrotic Amount: Large (67-100%) Fascia Exposed: No Necrotic Quality: Eschar, Adherent Slough Fat Layer (Subcutaneous Tissue) Exposed: Yes Tendon Exposed: No [name], [name] ([id number]) Muscle Exposed: No Joint Exposed: No Bone Exposed: No Periwound Skin Texture Texture Color No Abnormalities Noted: No No Abnormalities Noted: No Callus: Yes Atrophie Blanche: No Crepitus: No Cyanosis: No Excoriation: No Ecchymosis: No Induration: No Erythema: Yes Rash: No Erythema Location: Circumferential Scarring: Yes Hemosiderin Staining: No Mottled: No Moisture Pallor: No No Abnormalities Noted: No Rubor: No Dry / Scaly: No Maceration: Yes Temperature / Pain Temperature: No Abnormality Tenderness on Palpation: Yes Wound Preparation Ulcer Cleansing: Rinsed/Irrigated with Saline Topical Anesthetic Applied: Other: lidocaine 4%, Electronic Signature(s) Signed: 09/26/2018 4:03:08 PM By: Rema [name] Signed: 09/26/2018 5:17:02 PM By: Elliot Gurney, BSN, RN, CWS, Kim RN, BSN Entered By: Elliot Gurney, BSN, RN, CWS, Kim on 09/26/2018 11:38:23 [name] ([id number]) -------------------------------------------------------------------------------- Vitals Details Patient Name: [name] Date of Service: 09/26/2018 11:15 AM Medical Record Number: [id number] Patient Account Number: [id number] Date of Birth/Sex: 1930/01/31 (84 y.o. F) Treating RN: Huel Coventry Primary Care Adelaide Pfefferkorn: Aram Beecham Other Clinician: Referring Khambrel Amsden: Aram Beecham Treating Camilah Spillman/Extender: Altamese [place] in Treatment: 45 Vital Signs Time Taken: 11:17 Temperature (F): 98 Height (in): 60 Pulse (bpm): 68 Weight (lbs): 123 Respiratory Rate (breaths/min): 18 Body Mass Index (BMI): 24 Blood Pressure  (mmHg): 122/52 Reference Range: 80 - 120 mg / dl Electronic Signature(s) Signed: 09/26/2018 11:25:30 AM By: Dayton Martes RCP, RRT, CHT Entered By: Weyman Rodney, Lucio Edward on 09/26/2018 11:23:08

## 2018-09-28 NOTE — Progress Notes (Signed)
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 with this. The left lower extremity ulcer seem to be doing better although she still does have some openings noted the Prisma I think is helping more than the Xeroform was in my pinion. With that being said she still has a lot of healing to do in this regard. 04/27/18 on evaluation today patient appears to be doing very well in regard to her left lower Trinity ulcers. She has been tolerating the dressing changes without complication. I do have a note from her orthopedic surgeon today and they would like for me to help with treating her left elbow surgery site where she had the bursa removed and this was performed roughly 4 weeks ago according to the note that I reviewed. She has been placed on Bactrim DS by need for her leg wounds this probably helped a little bit with the left elbow surgery site. Obviously I do think this is something we can try to help her out with. 05/04/18 on evaluation today patient appears to be doing well in regard to her left anterior lower Trinity ulcers. She is making good progress which is great news. Unfortunately her elbow which we are also managing at this point in  time has not made as much progress unfortunately. She has been tolerating the dressing changes without complication. She did see Dr. Darleen Crocker earlier today and he states that he's willing to give this three weeks to see if she's making any progress with wound care. However he states that she's really not then he will need to go back in and perform further surgery. Obviously she is trying to avoid surgery if at all possible although I'm not sure if this is going to be possible or least not that quickly. 05/11/18 on evaluation today patient appears to be doing very well in regard to her left lower extremity ulcers. Unfortunately in regard to  her elbow this is very slow coming about as far as any improvement is concerned. I do feel like there may be a little bit more granulation noted in the base of the wound but nothing too significant unfortunately. I still can probe bone in the proximal portion of the wound which obviously explain to the patient is not good. She will be having a follow-up with her orthopedic surgeon in the next couple of weeks. In the meantime we are trying to do as much as we can to try to show signs of improvement in healing to avoid the need for any additional and further surgery. Nonetheless I explained to the patient yet again today I'm not sure if that is going to be feasible or not obviously it's more risk for her to continue to have an open wound with bone exposure then to the back in for additional surgery even though I know she doesn't want to go that route. 05/15/18 on evaluation today patient presents for follow-up concerning her ongoing lower extremity ulcers on the left as well as the left elbow ulcer. She has at this point in time been tolerating the dressing changes without complication. Her left lower extremity ulcer appears to be doing very well. In regard to the left elbow ulcer she actually does seem to have additional granulation today which is good news. I am  definitely seeing signs of improvement although obviously this is somewhat slow improvement. Nonetheless I'm hopeful we will be able to avoid her having to have any further surgery but again that would definitely be a conversation between herself as well as her surgeon once he sees her for reevaluation. Otherwise she does want to see about having a three order compression stockings for her today 05/21/18 on evaluation today patient appears to be doing well in regard to her left lower surety ulcer. This is almost completely healed and seems to be progressing very nicely. With that being said her left elbow is another story. I'm not really convinced in the past three weeks we've seen a significant improvement in this wound. With that being said if this is something that there is no surgical option for him we have to continue to work on this from the standpoint of conservative management with wound care she may make improvement given time. Nonetheless it appears that her surgeon is somewhat concerned about the possibility of infection and really is leaning towards additional surgery to try and help close this wound. Nonetheless the patient is still unsure of exactly what to do. 05/29/18 on evaluation today patient appears to be doing well in regard to her left lower extremity ulcer. She's been tolerating the dressing changes without complication which is good news. With that being said she's been having issues specifically with her elbow she did see her surgeon Dr. Joice Lofts and he is recommending a repeat surgery to the left elbow in order to correct the issue. The patient is still somewhat unsure of this but feels like this may be better than trying to take time to let this heal over a longer period of time through normal wound care measures. Again I explained that I agree this may be a faster way to go if her surgeon feels that this is indeed a good direction to take. Obviously only he can make the  judgment on whether or not the surgery would likely be successful. 06/04/18 on evaluation today patient actually presents for follow-up concerning her left elbow and left lower from the  ulcer she seems to be doing very well at this point in time. She has been tolerating the dressing changes without complication. With that being said her elbow is not significantly better she actually is scheduled for surgery tomorrow. 07/04/18; the patient had an area on her left leg that is remaining closed. The open area she has now is a postsurgical wound on the left elbow. I think we have clearance from the surgeon to see this now. [address], [place] V[place] Kitchen ([id number]) 07/11/18; we're currently dealing with a surgical wound on the left olecranon process. The patient complains of a lot of pain and drainage. When I saw her last week we did an x-ray that showed soft tissue wound and probable elbow joint effusion but no erosion to suggest osteomyelitis. The culture I did of this was somewhat surprisingly negative. She has a small open wound with not a viable surface there is considerable undermining relative to the wound size. She is on methotrexate for rheumatoid arthritis/overlap syndrome also plaquenil. We've been using silver collagen 07/18/18-She is seen in follow-up evaluation for a left elbow wound. There is essentially no change. She is currently on Zithromax and will complete that on Friday, there is no indication to extend this. We will change to iodosorb/iodoflex and monitor for response 07/25/18-She is seen in follow-up evaluation for left elbow wound. The wound is stable with no overt evidence of infection. She has counseled with her rheumatologist. She is wanting to restart her methotrexate; a culture was obtained to rule out occult infection before starting her methotrexate. We will continue with Iodosorb/Iodoflex and she will follow-up next week. 08/01/18; this is a difficult wound over her left  olecranon process. There is been concerned about infection although cultures including one done last week were negative. Pending 3 weeks ago I gave her an empiric course of antibiotics. She is having a lot of rheumatologic pain in her hands with pain and stiffness. She wants to go on her weekly methotrexate and I think it would be reasonable to do so. We have been using Iodoflex 08/01/18; difficult wound over her left olecranon process. She started back on methotrexate last week because of rheumatologic pain in her hands. We have been using Iodoflex to try and clean out the wound bed. She has been approved for Graphix PL 08/15/18; 2 week follow-up. Difficult wound over her left olecranon process. Graphix PL #1 with collagen backing 08/22/18; one-week follow-up. Difficult wound over her left olecranon process. Graphix PL #2 08/29/18; no major improvement. Difficult wound over her left olecranon process. Still considerable undermining. Graphics PL #3 1 week follow-up. Graphix #4 09/12/18 graphics #5. Some improvement in wound area although the undermining superiorly still has not closed down as much as I would like 09/19/18; Graphix #6 I think there is improvement in the undermining from 7 to 9:00. Wound bed looks healthy. 09/26/18 Graffix #7 undermining is 0.5 cm maximally at roughly 8:00. From 12 to 7:00 the tissue is adherent which is a major improvement there is some advancing skin from this side. Objective Constitutional Sitting or standing Blood Pressure is within target range for patient.. Pulse regular and within target range for patient.Marland Kitchen Respirations regular, non-labored and within target range.. Temperature is normal and within the target range for the patient.Marland Kitchen appears in no distress. Vitals Time Taken: 11:17 AM, Height: 60 in, Weight: 123 lbs, BMI: 24, Temperature: 98 F, Pulse: 68 bpm, Respiratory Rate: 18 breaths/min, Blood Pressure: 122/52 mmHg. General Notes: Wound exam; wound  bed is  satisfactory. There is skin coming from 12 to 4:00. Still undermining from about 6-10 maximal at 7.5 cm. Graffix PL applied in the standard fashion Integumentary (Hair, Skin) Wound #9 status is Open. Original cause of wound was Trauma. The wound is located on the Left Elbow. The wound measures 0.6cm length x 0.5cm width x 0.2cm depth; 0.236cm^2 area and 0.047cm^3 volume. There is Fat Layer (Subcutaneous Tissue) Exposed exposed. There is no tunneling noted, however, there is undermining starting at 6:00 and ending at 11:00 with a maximum distance of 0.5cm. There is a small amount of purulent drainage noted. The wound margin is distinct with the outline attached to the wound base. There is no granulation within the wound bed. There is a large (67-100%) amount of necrotic tissue within the wound bed including Eschar and Adherent Slough. The periwound skin appearance PHILIPPA, VESSEY ([id number]) exhibited: Callus, Scarring, Maceration, Erythema. The periwound skin appearance did not exhibit: Crepitus, Excoriation, Induration, Rash, Dry/Scaly, Atrophie Blanche, Cyanosis, Ecchymosis, Hemosiderin Staining, Mottled, Pallor, Rubor. The surrounding wound skin color is noted with erythema which is circumferential. Periwound temperature was noted as No Abnormality. The periwound has tenderness on palpation. 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 A skin graft procedure using a bioengineered skin substitute/cellular or tissue based product was performed by Maxwell Caul, MD  with the following instrument(s): Forceps and Scissors. Other was applied and secured with Steri-Strips. 3 sq cm of product was utilized and 0 sq cm was wasted. Post Application, mepitel one was applied. A Time Out was conducted at 11:41, prior to the start of the procedure. The procedure was tolerated well with a pain level of 1 throughout and a pain level of 1 following the procedure. Post procedure Diagnosis Wound #9: Same as Pre-Procedure . Plan Wound Cleansing: Wound #9 Left Elbow: Clean wound with Normal Saline. Anesthetic (add to Medication List): Wound #9 Left Elbow: Topical Lidocaine 4% cream applied to wound bed prior to debridement (In Clinic Only). Skin Barriers/Peri-Wound Care: Wound #9 Left Elbow: Skin Prep Dressing Change Frequency: Wound #9 Left Elbow: Change dressing every week - may change outer dressing if it gets soiled or wet Follow-up Appointments: Wound #9 Left Elbow: QUINLEE, SCIARRA ([id number]) Return Appointment in 1 week. Advanced Therapies: Wound #9 Left Elbow: Grafix PL application in clinic; including contact layer, fixation with steri strips, dry gauze and cover dressing. #1 left elbow olecranon process. Graffix PL #7 we are making some progress there is less undermining, less undermining depth and some advancing epithelialization from about 12 to 3:00 in this now small circular wound. Electronic Signature(s) Signed: 09/26/2018 4:50:13 PM By: Baltazar Najjar MD Entered By: Baltazar Najjar on 09/26/2018 12:11:00 [name] ([id number]) -------------------------------------------------------------------------------- SuperBill Details Patient Name: [name] Date of Service: 09/26/2018 Medical Record Number: [id number] Patient Account Number: [id number] Date of Birth/Sex: [birth year] (84 y.o. F) Treating RN: Huel Coventry Primary Care Provider: Aram Beecham Other Clinician: Referring Provider: Aram Beecham Treating Provider/Extender:  Altamese [place] in Treatment: 45 Diagnosis Coding ICD-10 Codes Code Description S51.002S Unspecified open wound of left elbow, sequela L98.495 Non-pressure chronic ulcer  of skin of other sites with muscle involvement without evidence of necrosis Facility Procedures CPT4: Description Modifier Quantity Code [id number] [id number] - SKIN SUB GRAFT TRNK/ARM/LEG 1 ICD-10 Diagnosis Description S51.002S Unspecified open wound of left elbow, sequela L98.495 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  without evidence of necrosis CPT4: [id number] Q4133- Grafix PL Prime 1.5 x 2 cm 3 ICD-10 Diagnosis Description S51.002S Unspecified open wound of left elbow, sequela L98.495 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

## 2018-10-03 ENCOUNTER — Ambulatory Visit: Payer: Medicare Other

## 2018-10-03 ENCOUNTER — Encounter: Payer: Medicare Other | Admitting: Internal Medicine

## 2018-10-03 DIAGNOSIS — S51002A Unspecified open wound of left elbow, initial encounter: Secondary | ICD-10-CM | POA: Diagnosis not present

## 2018-10-05 NOTE — Progress Notes (Signed)
FAY, BAGG ([id number]) Visit Report for 10/03/2018 Arrival Information Details Patient Name: [name], [name]. Date of Service: 10/03/2018 11:00 AM Medical Record Number: [id number] Patient Account Number: [id number] Date of Birth/Sex: November 30, 1930 (84 y.o. F) Treating RN: Huel Coventry Primary Care Romualdo Prosise: Aram Beecham Other Clinician: Referring Gizelle Whetsel: Aram Beecham Treating Earnestine Shipp/Extender: Altamese [place] in Treatment: 37 Visit Information History Since Last Visit Added or deleted any medications: No Patient Arrived: Walker Any new allergies or adverse reactions: No Arrival Time: 11:04 Had a fall or experienced change in No Accompanied By: self activities of daily living that may affect Transfer Assistance: None risk of falls: Patient Requires Transmission-Based No Signs or symptoms of abuse/neglect since last visito No Precautions: Hospitalized since last visit: No Patient Has Alerts: Yes Implantable device outside of the clinic excluding No Patient Alerts: non compressible left cellular tissue based products placed in the center leg since last visit: Has Dressing in Place as Prescribed: Yes Pain Present Now: No Electronic Signature(s) Signed: 10/03/2018 3:20:57 PM By: Dayton Martes RCP, RRT, CHT Entered By: Dayton Martes on 10/03/2018 11:05:16 [name] ([id number]) -------------------------------------------------------------------------------- Encounter Discharge Information Details Patient Name: [name] Date of Service: 10/03/2018 11:00 AM Medical Record Number: [id number] Patient Account Number: [id number] Date of Birth/Sex: [birth year] (84 y.o. F) Treating RN: Huel Coventry Primary Care Jakaylah Schlafer: Aram Beecham Other Clinician: Referring Nimai Burbach: Aram Beecham Treating Natilie Krabbenhoft/Extender: Altamese [place] in Treatment: 56 Encounter Discharge Information Items Discharge Condition:  Stable Ambulatory Status: Walker Discharge Destination: Home Transportation: Private Auto Schedule Follow-up Appointment: No Clinical Summary of Care: Post Procedure Vitals: Temperature (F): 97.8 Pulse (bpm): 69 Respiratory Rate (breaths/min): 16 Blood Pressure (mmHg): 129/53 Electronic Signature(s) Signed: 10/03/2018 4:26:56 PM By: Elliot Gurney, BSN, RN, CWS, Kim RN, BSN Entered By: Elliot Gurney, BSN, RN, CWS, Kim on 10/03/2018 16:26:56 [name] ([id number]) -------------------------------------------------------------------------------- Lower Extremity Assessment Details Patient Name: [name] Date of Service: 10/03/2018 11:00 AM Medical Record Number: [id number] Patient Account Number: [id number] Date of Birth/Sex: 1930/05/19 (84 y.o. F) Treating RN: Huel Coventry Primary Care Adalida Garver: Aram Beecham Other Clinician: Referring Nivia Gervase: Aram Beecham Treating Ertha Nabor/Extender: Altamese [place] in Treatment: 10 Electronic Signature(s) Signed: 10/03/2018 4:45:51 PM By: Elliot Gurney, BSN, RN, CWS, Kim RN, BSN Entered By: Elliot Gurney, BSN, RN, CWS, Kim on 10/03/2018 11:16:28 [name] ([id number]) -------------------------------------------------------------------------------- Multi Wound Chart Details Patient Name: [name] Date of Service: 10/03/2018 11:00 AM Medical Record Number: [id number] Patient Account Number: [id number] Date of Birth/Sex: 07/26/1930 (84 y.o. F) Treating RN: Huel Coventry Primary Care Sharie Amorin: Aram Beecham Other Clinician: Referring Maahi Lannan: Aram Beecham Treating Deazia Lampi/Extender: Altamese [place] in Treatment: 51 Vital Signs Height(in): 60 Pulse(bpm): 69 Weight(lbs): 123 Blood Pressure(mmHg): 129/53 Body Mass Index(BMI): 24 Temperature(F): 97.8 Respiratory Rate 18 (breaths/min): Photos: [9:No Photos] [N/A:N/A] Wound Location: [9:Left Elbow] [N/A:N/A] Wounding Event: [9:Trauma] [N/A:N/A] Primary Etiology: [9:Trauma,  Other] [N/A:N/A] Comorbid History: [9:Cataracts, Glaucoma, Optic Neuritis, Chronic sinus problems/congestion, Middle ear problems] [N/A:N/A] Date Acquired: [9:01/26/2018] [N/A:N/A] Weeks of Treatment: [9:22] [N/A:N/A] Wound Status: [9:Open] [N/A:N/A] Measurements L x W x D [9:0.5x0.4x0.2] [N/A:N/A] (cm) Area (cm) : [9:0.157] [N/A:N/A] Volume (cm) : [9:0.031] [N/A:N/A] % Reduction in Area: [9:-121.10%] [N/A:N/A] % Reduction in Volume: [9:-121.40%] [N/A:N/A] Starting Position 1 [9:6] (o'clock): Ending Position 1 [9:10] (o'clock): Maximum Distance 1 (cm): [9:0.8] Undermining: [9:Yes] [N/A:N/A] Classification: [9:Full Thickness With Exposed Support Structures] [N/A:N/A] Exudate Amount: [9:Medium] [N/A:N/A] Exudate Type: [9:Serous] [N/A:N/A] Exudate Color: [9:amber] [N/A:N/A] Wound Margin: [9:Distinct, outline attached] [N/A:N/A] Granulation Amount: [  9:Large (67-100%)] [N/A:N/A] Necrotic Amount: [9:None Present (0%)] [N/A:N/A] Exposed Structures: [9:Fat Layer (Subcutaneous Tissue) Exposed: Yes Fascia: No Tendon: No Muscle: No] [N/A:N/A] Joint: No Bone: No Epithelialization: None N/A N/A Periwound Skin Texture: Excoriation: No N/A N/A Induration: No Callus: No Crepitus: No Rash: No Scarring: No Periwound Skin Moisture: Maceration: No N/A N/A Dry/Scaly: No Periwound Skin Color: Erythema: Yes N/A N/A Atrophie Blanche: No Cyanosis: No Ecchymosis: No Hemosiderin Staining: No Mottled: No Pallor: No Rubor: No Erythema Location: Circumferential N/A N/A Temperature: No Abnormality N/A N/A Tenderness on Palpation: Yes N/A N/A Wound Preparation: Ulcer Cleansing: N/A N/A Rinsed/Irrigated with Saline Topical Anesthetic Applied: Other: lidocaine 4% Procedures Performed: Cellular or Tissue Based N/A N/A Product Treatment Notes Electronic Signature(s) Signed: 10/03/2018 4:09:45 PM By: Baltazar Najjar MD Entered By: Baltazar Najjar on 10/03/2018 12:04:11 [name]  ([id number]) -------------------------------------------------------------------------------- Multi-Disciplinary Care Plan Details Patient Name: [name] Date of Service: 10/03/2018 11:00 AM Medical Record Number: [id number] Patient Account Number: [id number] Date of Birth/Sex: 07/06/1930 (84 y.o. F) Treating RN: Huel Coventry Primary Care Raequan Vanschaick: Aram Beecham Other Clinician: Referring Vonna Brabson: Aram Beecham Treating Emaya Preston/Extender: Altamese [place] in Treatment: 36 Active Inactive ` Soft Tissue Infection Nursing Diagnoses: Impaired tissue integrity Goals: Patient's soft tissue infection will resolve Date Initiated: 07/18/2018 Target Resolution Date: 07/27/2018 Goal Status: Active Interventions: Assess signs and symptoms of infection every visit Treatment Activities: Culture and sensitivity : 07/18/2018 Systemic antibiotics : 07/18/2018 Notes: ` Wound/Skin Impairment Nursing Diagnoses: Impaired tissue integrity Knowledge deficit related to ulceration/compromised skin integrity Goals: Patient/caregiver will verbalize understanding of skin care regimen Date Initiated: 11/14/2017 Target Resolution Date: 11/28/2017 Goal Status: Active Ulcer/skin breakdown will have a volume reduction of 30% by week 4 Date Initiated: 11/14/2017 Target Resolution Date: 11/28/2017 Goal Status: Active Interventions: Assess patient/caregiver ability to obtain necessary supplies Assess patient/caregiver ability to perform ulcer/skin care regimen upon admission and as needed Assess ulceration(s) every visit Treatment Activities: Skin care regimen initiated : 11/14/2017 ARDINE, IACOVELLI ([id number]) Notes: Electronic Signature(s) Signed: 10/03/2018 4:45:51 PM By: Elliot Gurney, BSN, RN, CWS, Kim RN, BSN Entered By: Elliot Gurney, BSN, RN, CWS, Kim on 10/03/2018 11:25:34 [name] ([id number]) -------------------------------------------------------------------------------- Pain Assessment  Details Patient Name: [name] Date of Service: 10/03/2018 11:00 AM Medical Record Number: [id number] Patient Account Number: [id number] Date of Birth/Sex: [birth year] (84 y.o. F) Treating RN: Huel Coventry Primary Care Shirle Provencal: Aram Beecham Other Clinician: Referring Hasani Diemer: Aram Beecham Treating Ruchama Kubicek/Extender: Altamese [place] in Treatment: 27 Active Problems Location of Pain Severity and Description of Pain Patient Has Paino No Site Locations Pain Management and Medication Current Pain Management: Electronic Signature(s) Signed: 10/03/2018 3:20:57 PM By: Dayton Martes RCP, RRT, CHT Signed: 10/03/2018 4:45:51 PM By: Elliot Gurney, BSN, RN, CWS, Kim RN, BSN Entered By: Dayton Martes on 10/03/2018 11:05:24 [name] ([id number]) -------------------------------------------------------------------------------- Wound Assessment Details Patient Name: [name] Date of Service: 10/03/2018 11:00 AM Medical Record Number: [id number] Patient Account Number: [id number] Date of Birth/Sex: 09/24/1930 (84 y.o. F) Treating RN: Huel Coventry Primary Care Jamisha Hoeschen: Aram Beecham Other Clinician: Referring Jerad Dunlap: Aram Beecham Treating Davian Wollenberg/Extender: Altamese [place] in Treatment: 46 Wound Status Wound Number: 9 Primary Trauma, Other Etiology: Wound Location: Left Elbow Wound Open Wounding Event: Trauma Status: Date Acquired: 01/26/2018 Comorbid Cataracts, Glaucoma, Optic Neuritis, Chronic Weeks Of Treatment: 22 History: sinus problems/congestion, Middle ear problems Clustered Wound: No Photos Photo Uploaded By: Rema [name] on 10/03/2018 16:03:48 Wound Measurements Length: (cm) 0.5 Width: (cm) 0.4 Depth: (cm)  0.2 Area: (cm) 0.157 Volume: (cm) 0.031 % Reduction in Area: -121.1% % Reduction in Volume: -121.4% Epithelialization: None Tunneling: No Undermining: Yes Starting Position (o'clock): 6 Ending  Position (o'clock): 10 Maximum Distance: (cm) 0.8 Wound Description Full Thickness With Exposed Support Foul Odo Classification: Structures Slough/F Wound Margin: Distinct, outline attached Exudate Medium Amount: Exudate Type: Serous Exudate Color: amber r After Cleansing: No ibrino Yes Wound Bed Granulation Amount: Large (67-100%) Exposed Structure Necrotic Amount: None Present (0%) Fascia Exposed: No Fat Layer (Subcutaneous Tissue) Exposed: Yes VANESSIA, ROSZKOWSKI ([id number]) Tendon Exposed: No Muscle Exposed: No Joint Exposed: No Bone Exposed: No Periwound Skin Texture Texture Color No Abnormalities Noted: No No Abnormalities Noted: No Callus: No Atrophie Blanche: No Crepitus: No Cyanosis: No Excoriation: No Ecchymosis: No Induration: No Erythema: Yes Rash: No Erythema Location: Circumferential Scarring: No Hemosiderin Staining: No Mottled: No Moisture Pallor: No No Abnormalities Noted: No Rubor: No Dry / Scaly: No Maceration: No Temperature / Pain Temperature: No Abnormality Tenderness on Palpation: Yes Wound Preparation Ulcer Cleansing: Rinsed/Irrigated with Saline Topical Anesthetic Applied: Other: lidocaine 4%, Treatment Notes Wound #9 (Left Elbow) Notes grafix pl applied in clinic today, mepitel, steri strips, drawtex and bordered foam Electronic Signature(s) Signed: 10/03/2018 4:45:51 PM By: Elliot Gurney, BSN, RN, CWS, Kim RN, BSN Entered By: Elliot Gurney, BSN, RN, CWS, Kim on 10/03/2018 11:16:16 [name] ([id number]) -------------------------------------------------------------------------------- Vitals Details Patient Name: [name] Date of Service: 10/03/2018 11:00 AM Medical Record Number: [id number] Patient Account Number: [id number] Date of Birth/Sex: 16-Aug-1930 (84 y.o. F) Treating RN: Huel Coventry Primary Care Conway Fedora: Aram Beecham Other Clinician: Referring Marshelle Bilger: Aram Beecham Treating Onita Pfluger/Extender: Altamese  in Treatment: 55 Vital Signs Time Taken: 11:05 Temperature (F): 97.8 Height (in): 60 Pulse (bpm): 69 Weight (lbs): 123 Respiratory Rate (breaths/min): 18 Body Mass Index (BMI): 24 Blood Pressure (mmHg): 129/53 Reference Range: 80 - 120 mg / dl Electronic Signature(s) Signed: 10/03/2018 3:20:57 PM By: Dayton Martes RCP, RRT, CHT Entered By: Weyman Rodney, Lucio Edward on 10/03/2018 11:08:12

## 2018-10-05 NOTE — Progress Notes (Signed)
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 I think is helping more than the Xeroform was in my pinion. With that being said she still has a lot of healing to do in this regard. 04/27/18 on evaluation today patient appears to be doing very well in regard to her left lower Trinity ulcers. She has been tolerating the dressing changes without complication. I do have a note from her orthopedic surgeon today and they would like for me to help with treating her left elbow surgery site where she had the bursa removed and this was performed roughly 4 weeks ago according to the note that I reviewed. She has been placed on Bactrim DS by need for her leg wounds this probably helped a little bit with the left elbow surgery site. Obviously I do think this is something we can try to help her out with. 05/04/18 on evaluation today patient appears to be doing well in regard to her left anterior lower Trinity ulcers. She is making good progress which is great news. Unfortunately her elbow which we are also managing at this point in time has not made as much progress unfortunately. She has been tolerating the dressing changes without complication. She did see Dr. Darleen Crocker earlier today and he  states that he's willing to give this three weeks to see if she's making any progress with wound care. However he states that she's really not then he will need to go back in and perform further surgery. Obviously she is trying to avoid surgery if at all possible although I'm not sure if this is going to be possible or least not that quickly. 05/11/18 on evaluation today patient appears to be doing very well in regard to her left lower extremity ulcers. Unfortunately in regard to her elbow this is very slow coming about as far as any improvement is concerned. I do feel like there may be a little  bit more granulation noted in the base of the wound but nothing too significant unfortunately. I still can probe bone in the proximal portion of the wound which obviously explain to the patient is not good. She will be having a follow-up with her orthopedic surgeon in the next couple of weeks. In the meantime we are trying to do as much as we can to try to show signs of improvement in healing to avoid the need for any additional and further surgery. Nonetheless I explained to the patient yet again today I'm not sure if that is going to be feasible or not obviously it's more risk for her to continue to have an open wound with bone exposure then to the back in for additional surgery even though I know she doesn't want to go that route. 05/15/18 on evaluation today patient presents for follow-up concerning her ongoing lower extremity ulcers on the left as well as the left elbow ulcer. She has at this point in time been tolerating the dressing changes without complication. Her left lower extremity ulcer appears to be doing very well. In regard to the left elbow ulcer she actually does seem to have additional granulation today which is good news. I am definitely seeing signs of improvement although obviously this is somewhat slow improvement. Nonetheless I'm hopeful we will be able to avoid her having to have any  further surgery but again that would definitely be a conversation between herself as well as her surgeon once he sees her for reevaluation. Otherwise she does want to see about having a three order compression stockings for her today 05/21/18 on evaluation today patient appears to be doing well in regard to her left lower surety ulcer. This is almost completely healed and seems to be progressing very nicely. With that being said her left elbow is another story. I'm not really convinced in the past three weeks we've seen a significant improvement in this wound. With that being said if this is something that there is no surgical option for him we have to continue to work on this from the standpoint of conservative management with wound care she may make improvement given time. Nonetheless it appears that her surgeon is somewhat concerned about the possibility of infection and really is leaning towards additional surgery to try and help close this wound. Nonetheless the patient is still unsure of exactly what to do. 05/29/18 on evaluation today patient appears to be doing well in regard to her left lower extremity ulcer. She's been tolerating the dressing changes without complication which is good news. With that being said she's been having issues specifically with her elbow she did see her surgeon Dr. Joice Lofts and he is recommending a repeat surgery to the left elbow in order to correct the issue. The patient is still somewhat unsure of this but feels like this may be better than trying to take time to let this heal over a longer period of time through normal wound care measures. Again I explained that I agree this may be a faster way to go if her surgeon feels that this is indeed a good direction to take. Obviously only he can make the judgment on whether or not the surgery would likely be successful. 06/04/18 on evaluation today patient actually presents for follow-up concerning her left elbow and left  lower from the ulcer she seems to be doing very well at this point in time. She has been tolerating the dressing changes without complication. With that  being said her elbow is not significantly better she actually is scheduled for surgery tomorrow. 07/04/18; the patient had an area on her left leg that is remaining closed. The open area she has now is a postsurgical wound on the left elbow. I think we have clearance from the surgeon to see this now. [address] [place] ([id number]) 07/11/18; we're currently dealing with a surgical wound on the left olecranon process. The patient complains of a lot of pain and drainage. When I saw her last week we did an x-ray that showed soft tissue wound and probable elbow joint effusion but no erosion to suggest osteomyelitis. The culture I did of this was somewhat surprisingly negative. She has a small open wound with not a viable surface there is considerable undermining relative to the wound size. She is on methotrexate for rheumatoid arthritis/overlap syndrome also plaquenil. We've been using silver collagen 07/18/18-She is seen in follow-up evaluation for a left elbow wound. There is essentially no change. She is currently on Zithromax and will complete that on Friday, there is no indication to extend this. We will change to iodosorb/iodoflex and monitor for response 07/25/18-She is seen in follow-up evaluation for left elbow wound. The wound is stable with no overt evidence of infection. She has counseled with her rheumatologist. She is wanting to restart her methotrexate; a culture was obtained to rule out occult infection before starting her methotrexate. We will continue with Iodosorb/Iodoflex and she will follow-up next week. 08/01/18; this is a difficult wound over her left olecranon process. There is been concerned about infection although cultures including one done last week were negative. Pending 3 weeks ago I gave her an empiric course  of antibiotics. She is having a lot of rheumatologic pain in her hands with pain and stiffness. She wants to go on her weekly methotrexate and I think it would be reasonable to do so. We have been using Iodoflex 08/01/18; difficult wound over her left olecranon process. She started back on methotrexate last week because of rheumatologic pain in her hands. We have been using Iodoflex to try and clean out the wound bed. She has been approved for Graphix PL 08/15/18; 2 week follow-up. Difficult wound over her left olecranon process. Graphix PL #1 with collagen backing 08/22/18; one-week follow-up. Difficult wound over her left olecranon process. Graphix PL #2 08/29/18; no major improvement. Difficult wound over her left olecranon process. Still considerable undermining. Graphics PL #3 1 week follow-up. Graphix #4 09/12/18 graphics #5. Some improvement in wound area although the undermining superiorly still has not closed down as much as I would like 09/19/18; Graphix #6 I think there is improvement in the undermining from 7 to 9:00. Wound bed looks healthy. 09/26/18 Graffix #7 undermining is 0.5 cm maximally at roughly 8:00. From 12 to 7:00 the tissue is adherent which is a major improvement there is some advancing skin from this side. 10/03/18; Graphix #8 no major changes from last week Objective Constitutional Vitals Time Taken: 11:05 AM, Height: 60 in, Weight: 123 lbs, BMI: 24, Temperature: 97.8 F, Pulse: 69 bpm, Respiratory Rate: 18 breaths/min, Blood Pressure: 129/53 mmHg. Integumentary (Hair, Skin) Wound #9 status is Open. Original cause of wound was Trauma. The wound is located on the Left Elbow. The wound measures 0.5cm length x 0.4cm width x 0.2cm depth; 0.157cm^2 area and 0.031cm^3 volume. There is Fat Layer (Subcutaneous Tissue) Exposed exposed. There is no tunneling noted, however, there is undermining starting at 6:00 and ending at  10:00 with a maximum distance of 0.8cm. There is a  medium amount of serous drainage noted. The wound margin is distinct with the outline attached to the wound base. There is large (67-100%) granulation within the wound bed. There is no necrotic tissue within the wound bed. The periwound skin appearance exhibited: Erythema. The periwound skin appearance did not exhibit: Callus, Crepitus, Excoriation, Induration, Rash, Scarring, Dry/Scaly, Maceration, Atrophie Blanche, Cyanosis, Ecchymosis, Hemosiderin Staining, Mottled, Pallor, Rubor. The surrounding wound skin color is noted with erythema which is circumferential. Periwound temperature was noted as No Abnormality. The periwound has tenderness on palpation. 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 A skin graft procedure using a bioengineered skin substitute/cellular or tissue based product was performed by Maxwell Caul, MD with the following instrument(s): Forceps and Scissors. Other was applied and secured with Steri-Strips. 3 sq cm of product was utilized and 0 sq cm was wasted. Post Application, mepitel one was applied. A Time Out was conducted at 11:27, prior to the start of the procedure. The procedure was tolerated well with a pain level of 0 throughout and a pain level of 0 following the procedure. Post procedure Diagnosis Wound #9: Same as Pre-Procedure . Plan Wound Cleansing: Wound #9 Left Elbow: Clean wound with Normal Saline. Anesthetic (add to Medication List): Wound #9 Left Elbow: Topical Lidocaine 4% cream applied to  wound bed prior to debridement (In Clinic Only). Skin Barriers/Peri-Wound Care: Wound #9 Left Elbow: Skin Prep Secondary Dressing: Wound #9 Left Elbow: Drawtex Dressing Change Frequency: Wound #9 Left Elbow: Change dressing every week - may change outer dressing if it gets soiled or wet Follow-up Appointments: Wound #9 Left Elbow: Return Appointment in 1 week. Advanced Therapies: [name], [name] ([id number]) Wound #9 Left Elbow: Grafix PL application in clinic; including contact layer, fixation with steri strips, dry gauze and cover dressing. #1 graffix PL #8 #2 if this is not better in the next 2 or 3 weeks I'll consider referring her to plastic surgery Electronic Signature(s) Signed: 10/03/2018 4:09:45 PM By: Baltazar Najjar MD Entered By: Baltazar Najjar on 10/03/2018 12:12:29 [name] ([id number]) -------------------------------------------------------------------------------- SuperBill Details Patient Name: [name] Date of Service: 10/03/2018 Medical Record Number: [id number] Patient Account Number: [id number] Date of Birth/Sex: [birth year] (84 y.o. 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

## 2018-10-09 ENCOUNTER — Ambulatory Visit: Payer: Medicare Other

## 2018-10-09 DIAGNOSIS — R262 Difficulty in walking, not elsewhere classified: Secondary | ICD-10-CM

## 2018-10-09 DIAGNOSIS — R2681 Unsteadiness on feet: Secondary | ICD-10-CM

## 2018-10-09 DIAGNOSIS — G8929 Other chronic pain: Secondary | ICD-10-CM

## 2018-10-09 DIAGNOSIS — M6281 Muscle weakness (generalized): Secondary | ICD-10-CM

## 2018-10-09 DIAGNOSIS — M545 Low back pain: Secondary | ICD-10-CM

## 2018-10-09 DIAGNOSIS — Z9181 History of falling: Secondary | ICD-10-CM

## 2018-10-09 NOTE — Therapy (Signed)
[facility] PHYSICAL AND SPORTS MEDICINE [address] [place], [address] Phone: [phone]   Fax:  [phone]  Physical Therapy Treatment  Patient Details  Name: [name] MRN: [id number] Date of Birth: 1930/05/29 Referring Provider (PT): Cristopher Peru, MD   Encounter Date: 10/09/2018  PT End of Session - 10/09/18 1440    Visit Number  8    Number of Visits  17    Date for PT Re-Evaluation  11/01/18    Authorization Type  8    Authorization Time Period  of 10 progress report    PT Start Time  1441    PT Stop Time  1527    PT Time Calculation (min)  46 min    Equipment Utilized During Treatment  Gait belt    Activity Tolerance  Patient tolerated treatment well    Behavior During Therapy  [facility] for tasks assessed/performed       Past Medical History:  Diagnosis Date  . Anemia   . GERD (gastroesophageal reflux disease)   . Hypertension   . Peripheral vascular disease (HCC)    possible neuropathies in lower extremeties  . PONV (postoperative nausea and vomiting)    happens sometimes but better with pre med of zofran  . Presence of permanent cardiac pacemaker   . Syncope     Past Surgical History:  Procedure Laterality Date  . ABDOMINAL HYSTERECTOMY  1969  . APPENDECTOMY  1969   with hysterectomy  . BACK SURGERY  2001   rods in back. surgery on back x 3  . COLONOSCOPY    . I&D EXTREMITY Left 03/27/2018   Procedure: IRRIGATION AND DEBRIDEMENT LEFT ELBOW / OLECRANON BURSA;  Surgeon: Christena Flake, MD;  Location: [facility] ORS;  Service: Orthopedics;  Laterality: Left;  . I&D EXTREMITY Left 06/05/2018   Procedure: IRRIGATION AND DEBRIDEMENT EXTREMITY;  Surgeon: Christena Flake, MD;  Location: [facility] ORS;  Service: Orthopedics;  Laterality: Left;  . INSERT / REPLACE / REMOVE PACEMAKER  2004  . JOINT REPLACEMENT Left 2004   partial knee replacement  . OLECRANON BURSECTOMY Left 03/27/2018   Procedure: LEFT OLECRANON BURSA;  Surgeon:  Christena Flake, MD;  Location: [facility] ORS;  Service: Orthopedics;  Laterality: Left;  . PACEMAKER INSERTION  2014   dual chamber for complete heart block    There were no vitals filed for this visit.  Subjective Assessment - 10/09/18 1443    Subjective  Not much change in back and balance. No back pain currently. Needs to do something about her mattress due to her body leaving an imprint on her bed.  Pt states that her endurance in cooking/fixing a meal is not as good as it used to.     Pertinent History  Imparied gait. Pt states that she has had balance issues for too long. Feels more comfortable walking with her rw. Had tests which showed that she has neuropathy. However the doctor at [facility] said that her balance is coming from her back. Just wants to be strong so she can walk.  Started walking with her rw off and on for the past several months.  Also feels like she might need a new sleep number bed. Also had 2 wound surgeries at [name]. Pt fell in February 2019. Had to stop PT due to the L elbow surgery April 06, 2018 to remove a bursa. The wound still has not healed.   Currently sees a wound  doctor.   Feels numbness L foot from the ankle down.  No R foot numbness.  Sometimes unsure of where her L LE is in space.  Pt also states that she tires too quickly.  Has not been able to walk her dog in 2 years. Has not really been doing exercises at home except the sit <> stand. Pt states that prolonged sitting without back support will increase back pain     Limitations  Standing;Walking    How long can you sit comfortably?  1 hr    How long can you stand comfortably?  10 minutes    How long can you walk comfortably?  2 or 3 blocks    Diagnostic tests  EMG testing BLEs and BUEs which were negative    Patient Stated Goals  Improve LE strength. Wants to use the machines at the gym. Walk more steadily. Be able to take her dog (16 lbs) out on a leash.     Currently in Pain?  No/denies    Pain Onset   More than a month ago                               PT Education - 10/09/18 1514    Education provided  Yes    Education Details  ther-ex    [employer]) Educated  Patient    Methods  Explanation;Demonstration;Tactile cues;Verbal cues    Comprehension  Returned demonstration;Verbalized understanding      Objectives  Pt ambulating with rw  TTP bilateral greater trochanter L >R   L elbow wound    Ther-ex  CGA with standing activities.  Sit <> stand from regular chair with arms with B UE assist 5x and throughout session.  Emphasis on placing and maintaining her center of gravity over base of support    Standing alternating toe taps onto treadmill platform with B UE assist. 10x2 each LE Then with 2 finger touch assist 10x each LE Then with light touch to no UE assist 10x each LE   R lateral toe taps onto treadmill with R UE assist 10x3 (for L LE weight bearing)  Standing R trunk side bend 10x, then 10x5 seconds. No change in L ankle and foot numbness. Slight L low back discomfort. Feels better with sitting rest break.   Gait with SPC on R hand 105 ft, 100 ft. CGA. Pt fearful at first but improved ability to perform with less fear with practice.    Standing LE leg press with bilateral UE assist, resisting blue band 10x3 each LE. Less difficulty with R LE compared to L observed today.     Standing forward weight shifting 10x5 seconds 3 sets, No UE assist, CGA  Forward step up onto Air ex pad with B UE assist 10x  Then 2 finger assist 10x each LE  Improved exercise technique, movement at target joints, use of target muscles after mod verbal, visual, tactile cues.    Better able to stand up from sitting position with cues to place and maintain her center of gravity onto her feet. Continued working on standing balance related activities such as toe taps and forward weight shifting to help feel where her L foot is in space and to help  decrease fall risk. Also worked on LE strengthening and gait with SPC to help decrease fall risk and help return patient to prior level of function.  Pt will benefit from continued skilled  physical therapy services to improve LE strength, balance, decrease back pain, decrease fall risk and improve function.         PT Short Term Goals - 09/06/18 2136      PT SHORT TERM GOAL #1   Title  Patient will be independent with her HEP to improve strength and balance.     Time  3    Period  Weeks    Status  New    Target Date  09/27/18        PT Long Term Goals - 09/06/18 1836      PT LONG TERM GOAL #1   Title  Patient will improve her 10 MWT to 0.8 m/s or more with rw to promote better community ambulation.     Baseline  0.5 seconds with rw (09/06/2018)    Time  8    Period  Weeks    Status  New    Target Date  11/01/18      PT LONG TERM GOAL #2   Title  Patient will improve her DGI score using rw or least restrictive AD to 19/24 or more to promote balance.     Baseline  13/24 using rw (09/06/2018)    Time  8    Period  Weeks    Status  New    Target Date  11/01/18      PT LONG TERM GOAL #3   Title  Patient will improve bilateral LE strength by at least 1/2 MMT to promote ability to support herself when performing standing tasks and improve balance.     Time  8    Period  Weeks    Status  New    Target Date  11/01/18      PT LONG TERM GOAL #4   Title  Patient will have a decrease in back pain/ache to 5/10 or less to promote ability to perform standing tasks with less difficulty.    Baseline  8/10 at most for the past 3 months (09/06/2018)    Time  8    Period  Weeks    Status  New    Target Date  11/01/18            Plan - 10/09/18 1438    Clinical Impression Statement  Better able to stand up from sitting position with cues to place and maintain her center of gravity onto her feet. Continued working on standing balance related activities such as toe taps and  forward weight shifting to help feel where her L foot is in space and to help decrease fall risk. Also worked on LE strengthening and gait with SPC to help decrease fall risk and help return patient to prior level of function.  Pt will benefit from continued skilled physical therapy services to improve LE strength, balance, decrease back pain, decrease fall risk and improve function.    Rehab Potential  Fair    Clinical Impairments Affecting Rehab Potential  (-) chronicity of condition, age, comorbidities; (+) motivated    PT Frequency  2x / week    PT Duration  8 weeks    PT Treatment/Interventions  Aquatic Therapy;Gait training;Therapeutic activities;Therapeutic exercise;Balance training;Neuromuscular re-education;Patient/family education;Manual techniques;Dry needling    PT Next Visit Plan  trunk, hip, scapular strengthening, balance, gait, manual techniques PRN    PT Home Exercise Plan  Sit<>stand, standing hip ext and abd, bridging, and heel raises    Consulted and Agree with Plan of Care  Patient  Patient will benefit from skilled therapeutic intervention in order to improve the following deficits and impairments:  Abnormal gait, Decreased activity tolerance, Decreased balance, Decreased endurance, Decreased range of motion, Decreased strength, Difficulty walking, Improper body mechanics, Postural dysfunction, Pain, Decreased knowledge of use of DME  Visit Diagnosis: Unsteadiness on feet  History of falling  Muscle weakness (generalized)  Difficulty in walking, not elsewhere classified  Chronic low back pain, unspecified back pain laterality, unspecified whether sciatica present     Problem List Patient Active Problem List   Diagnosis Date Noted  . Septic olecranon bursitis of left elbow 03/27/2018  . Lymphedema 10/11/2017  . Chronic venous insufficiency 10/11/2017  . Leg pain 10/11/2017  . Leg swelling 10/11/2017  . COPD (chronic obstructive pulmonary disease) (HCC)  10/11/2017  . Essential hypertension 10/11/2017   Loralyn Freshwater PT, DPT   10/09/2018, 3:37 PM  [facility] PHYSICAL AND SPORTS MEDICINE [address] [place], [address] Phone: [phone]   Fax:  [phone]  Name: CHLOEANN ALFRED MRN: [id number] Date of Birth: [birth year]

## 2018-10-10 ENCOUNTER — Encounter: Payer: Medicare Other | Admitting: Internal Medicine

## 2018-10-10 DIAGNOSIS — S51002A Unspecified open wound of left elbow, initial encounter: Secondary | ICD-10-CM | POA: Diagnosis not present

## 2018-10-13 NOTE — Progress Notes (Signed)
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 then time to initiate the Prisma. Nonetheless good news is she is noting excellent improvement she does still have some discomfort but nothing as significant as previously noted. 04/17/18 on evaluation today patient appears to be doing very well and in fact her right lower extremity ulcer has completely healed at this point I'm pleased with this. The left lower extremity ulcer seem to be doing better although she still does have some openings noted the Prisma I think is helping more than the Xeroform was in my pinion. With that being said she still has a lot of healing to do in this regard. 04/27/18 on evaluation today patient appears to be doing very well in regard to her left lower Trinity ulcers. She has been tolerating the dressing changes without complication. I do have a note from her orthopedic surgeon today and they would like for me to help with treating her left elbow surgery site where she had the bursa removed and this was performed roughly 4 weeks ago according to the note that I reviewed. She has been placed on Bactrim DS by need for her leg wounds this probably helped a little bit with the left  elbow surgery site. Obviously I do think this is something we can try to help her out with. 05/04/18 on evaluation today patient appears to be doing well in regard to her left anterior lower Trinity ulcers. She is making good progress which is great news. Unfortunately her elbow which we are also managing at this point in time has not made as much progress unfortunately. She has been tolerating the dressing changes without complication. She did see Dr. Darleen Crocker earlier today and he states that he's willing to give this three weeks to see if she's making any progress with wound care. However he states that she's really not then he will need to go back in and perform further surgery. Obviously she  is trying to avoid surgery if at all possible although I'm not sure if this is going to be possible or least not that quickly. 05/11/18 on evaluation today patient appears to be doing very well in regard to her left lower extremity ulcers. Unfortunately in regard to her elbow this is very slow coming about as far as any improvement is concerned. I do feel like there may be a little bit more granulation noted in the base of the wound but nothing too significant unfortunately. I still can probe bone in the proximal portion of the wound which obviously explain to the patient is not good. She will be having a follow-up with her orthopedic surgeon in the next couple of weeks. In the meantime we are trying to do as much as we can to try to show signs of improvement in healing to avoid the need for any additional and further surgery. Nonetheless I explained to the patient yet again today I'm not sure if that is going to be feasible or not obviously it's more risk for her to continue to have an open wound with bone exposure then to the back in for additional surgery even though I know she doesn't want to go that route. 05/15/18 on evaluation today patient presents for follow-up concerning her ongoing lower extremity ulcers  on the left as well as the left elbow ulcer. She has at this point in time been tolerating the dressing changes without complication. Her left lower extremity ulcer appears to be doing very well. In regard to the left elbow ulcer she actually does seem to have additional granulation today which is good news. I am definitely seeing signs of improvement although obviously this is somewhat slow improvement. Nonetheless I'm hopeful we will be able to avoid her having to have any further surgery but again that would definitely be a conversation between herself as well as her surgeon once he sees her for reevaluation. Otherwise she does want to see about having a three order compression stockings for her today 05/21/18 on evaluation today patient appears to be doing well in regard to her left lower surety ulcer. This is almost completely healed and seems to be progressing very nicely. With that being said her left elbow is another story. I'm not really convinced in the past three weeks we've seen a significant improvement in this wound. With that being said if this is something that there is no surgical option for him we have to continue to work on this from the standpoint of conservative management with wound care she may make improvement given time. Nonetheless it appears that her surgeon is somewhat concerned about the possibility of infection and really is leaning towards additional surgery to try and help close this wound. Nonetheless the patient is still unsure of exactly what to do. 05/29/18 on evaluation today patient appears to be doing well in regard to her left lower extremity ulcer. She's been tolerating the dressing changes without complication which is good news. With that being said she's been having issues specifically with her elbow she did see her surgeon Dr. Joice Lofts and he is recommending a repeat surgery to the left elbow in order to correct the issue. The patient is still somewhat unsure  of this but feels like this may be better than trying to take time to let this heal over a longer period of time through normal wound care measures. Again I explained that I agree this may be a faster way to go  if her surgeon feels that this is indeed a good direction to take. Obviously only he can make the judgment on whether or not the surgery would likely be successful. 06/04/18 on evaluation today patient actually presents for follow-up concerning her left elbow and left lower from the ulcer she seems to be doing very well at this point in time. She has been tolerating the dressing changes without complication. With that being said her elbow is not significantly better she actually is scheduled for surgery tomorrow. 07/04/18; the patient had an area on her left leg that is remaining closed. The open area she has now is a postsurgical wound on the left elbow. I think we have clearance from the surgeon to see this now. [address] [place] VMarland Kitchen ([id number]) 07/11/18; we're currently dealing with a surgical wound on the left olecranon process. The patient complains of a lot of pain and drainage. When I saw her last week we did an x-ray that showed soft tissue wound and probable elbow joint effusion but no erosion to suggest osteomyelitis. The culture I did of this was somewhat surprisingly negative. She has a small open wound with not a viable surface there is considerable undermining relative to the wound size. She is on methotrexate for rheumatoid arthritis/overlap syndrome also plaquenil. We've been using silver collagen 07/18/18-She is seen in follow-up evaluation for a left elbow wound. There is essentially no change. She is currently on Zithromax and will complete that on Friday, there is no indication to extend this. We will change to iodosorb/iodoflex and monitor for response 07/25/18-She is seen in follow-up evaluation for left elbow wound. The wound is stable with no overt evidence of  infection. She has counseled with her rheumatologist. She is wanting to restart her methotrexate; a culture was obtained to rule out occult infection before starting her methotrexate. We will continue with Iodosorb/Iodoflex and she will follow-up next week. 08/01/18; this is a difficult wound over her left olecranon process. There is been concerned about infection although cultures including one done last week were negative. Pending 3 weeks ago I gave her an empiric course of antibiotics. She is having a lot of rheumatologic pain in her hands with pain and stiffness. She wants to go on her weekly methotrexate and I think it would be reasonable to do so. We have been using Iodoflex 08/01/18; difficult wound over her left olecranon process. She started back on methotrexate last week because of rheumatologic pain in her hands. We have been using Iodoflex to try and clean out the wound bed. She has been approved for Graphix PL 08/15/18; 2 week follow-up. Difficult wound over her left olecranon process. Graphix PL #1 with collagen backing 08/22/18; one-week follow-up. Difficult wound over her left olecranon process. Graphix PL #2 08/29/18; no major improvement. Difficult wound over her left olecranon process. Still considerable undermining. Graphics PL #3 1 week follow-up. Graphix #4 09/12/18 graphics #5. Some improvement in wound area although the undermining superiorly still has not closed down as much as I would like 09/19/18; Graphix #6 I think there is improvement in the undermining from 7 to 9:00. Wound bed looks healthy. 09/26/18 Graffix #7 undermining is 0.5 cm maximally at roughly 8:00. From 12 to 7:00 the tissue is adherent which is a major improvement there is some advancing skin from this side. 10/03/18; Graphix #8 no major changes from last week 10/10/18 Graffix #8 There are improvements. There appears to be granulation coming up to the surface here and  there is a lot less undermining at  8:00. Objective Constitutional Sitting or standing Blood Pressure is within target range for patient.. Pulse regular and within target range for patient.Marland Kitchen Respirations regular, non-labored and within target range.. Temperature is normal and within the target range for the patient.Marland Kitchen appears in no distress. Vitals Time Taken: 11:11 AM, Height: 60 in, Weight: 123 lbs, BMI: 24, Temperature: 98.0 F, Pulse: 66 bpm, Respiratory Rate: 18 breaths/min, Blood Pressure: 120/42 mmHg. General Notes: Wound exam; wound bed is satisfactory. There appears to be advancing granulation tissue and the probing area 8:00 seems to have filled in. This is improvement on all fronts. Integumentary (Hair, Skin) Wound #9 status is Open. Original cause of wound was Trauma. The wound is located on the Left Elbow. The wound measures 0.8cm length x 0.6cm width x 0.3cm depth; 0.377cm^2 area and 0.113cm^3 volume. There is Fat Layer (Subcutaneous Tissue) Exposed exposed. There is no tunneling noted, however, there is undermining starting at 3:00 and [name], [name]. ([id number]) ending at 9:00 with a maximum distance of 0.5cm. There is a large amount of serous drainage noted. The wound margin is distinct with the outline attached to the wound base. There is no granulation within the wound bed. There is no necrotic tissue within the wound bed. The periwound skin appearance exhibited: Erythema. The periwound skin appearance did not exhibit: Callus, Crepitus, Excoriation, Induration, Rash, Scarring, Dry/Scaly, Maceration, Atrophie Blanche, Cyanosis, Ecchymosis, Hemosiderin Staining, Mottled, Pallor, Rubor. The surrounding wound skin color is noted with erythema which is circumferential. Periwound temperature was noted as No Abnormality. The periwound has tenderness on palpation. 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 A skin graft procedure using a bioengineered skin substitute/cellular or tissue based product was performed by Maxwell Caul, MD with the following instrument(s): Forceps and Scissors. Other was applied and secured with Steri-Strips. 0.5 sq cm of product was utilized and 2.5 sq cm was wasted due to wound size. Post Application, mepitel one was applied. A Time Out was conducted at 11:46, prior to the start of the procedure. The procedure was not tolerated well with a pain level of 0 throughout and a pain level of 0 following the procedure. Post procedure Diagnosis Wound #9: Same as Pre-Procedure . Plan Wound Cleansing: Wound #9 Left Elbow: Clean wound with Normal Saline. Anesthetic (add to Medication List): Wound #9 Left Elbow: Topical Lidocaine 4% cream applied to wound bed prior to debridement (In Clinic Only). Skin Barriers/Peri-Wound Care: Wound #9 Left Elbow: Skin Prep Secondary Dressing: Wound #9 Left Elbow: [name], [name] ([id number]) Dressing Change Frequency: Wound #9 Left Elbow: Change dressing every week - may change outer dressing if it gets soiled or wet Follow-up Appointments: Wound #9 Left Elbow: Return Appointment in 1 week. Advanced Therapies: Wound #9 Left Elbow: Grafix PL application in clinic; including contact layer, fixation with steri strips, dry gauze and cover dressing. #1Graphix PL #2 fairly substantial and reproducible improvement noticed by myself and our case coordinator Electronic Signature(s) Signed: 10/10/2018 5:32:05 PM By: Baltazar Najjar MD Entered By: Baltazar Najjar on 10/10/2018 13:33:27 [name]  ([id number]) -------------------------------------------------------------------------------- SuperBill Details Patient  Name: [name], [name] Date of Service: 10/10/2018 Medical Record Number: [id number] Patient Account Number: [id number] Date of Birth/Sex: [birth year] (84 y.o. 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

## 2018-10-13 NOTE — Progress Notes (Signed)
HENNESY, ICENHOWER ([id number]) Visit Report for 10/10/2018 Arrival Information Details Patient Name: [name], [name]. Date of Service: 10/10/2018 11:15 AM Medical Record Number: [id number] Patient Account Number: [id number] Date of Birth/Sex: 1930-07-17 (84 y.o. F) Treating RN: Rema [name] Primary Care Nazar Kuan: Aram Beecham Other Clinician: Referring Masha Orbach: Aram Beecham Treating Ivania Teagarden/Extender: Altamese [place] in Treatment: 3 Visit Information History Since Last Visit Added or deleted any medications: No Patient Arrived: Walker Any new allergies or adverse reactions: No Arrival Time: 11:11 Had a fall or experienced change in No Accompanied By: self activities of daily living that may affect Transfer Assistance: None risk of falls: Patient Identification Verified: Yes Signs or symptoms of abuse/neglect since last visito No Secondary Verification Process Yes Hospitalized since last visit: No Completed: Has Dressing in Place as Prescribed: Yes Patient Requires Transmission-Based No Pain Present Now: No Precautions: Patient Has Alerts: Yes Patient Alerts: non compressible left leg Electronic Signature(s) Signed: 10/10/2018 3:57:54 PM By: Rema [name] Entered By: Rema [name] on 10/10/2018 11:16:40 [name] ([id number]) -------------------------------------------------------------------------------- Encounter Discharge Information Details Patient Name: [name] Date of Service: 10/10/2018 11:15 AM Medical Record Number: [id number] Patient Account Number: [id number] Date of Birth/Sex: 19-Apr-1930 (83 y.o. F) Treating RN: Huel Coventry Primary Care Telly Jawad: Aram Beecham Other Clinician: Referring Merna Baldi: Aram Beecham Treating Erial Fikes/Extender: Altamese [place] in Treatment: 58 Encounter Discharge Information Items Post Procedure Vitals Discharge Condition: Stable Temperature (F): 98.0 Ambulatory Status: Walker Pulse (bpm):  66 Discharge Destination: Home Respiratory Rate (breaths/min): 16 Transportation: Private Auto Blood Pressure (mmHg): 120/42 Accompanied By: self Schedule Follow-up Appointment: Yes Clinical Summary of Care: Electronic Signature(s) Signed: 10/11/2018 7:26:08 AM By: Elliot Gurney, BSN, RN, CWS, Kim RN, BSN Entered By: Elliot Gurney, BSN, RN, CWS, Kim on 10/10/2018 11:56:43 [name] ([id number]) -------------------------------------------------------------------------------- Lower Extremity Assessment Details Patient Name: [name] Date of Service: 10/10/2018 11:15 AM Medical Record Number: [id number] Patient Account Number: [id number] Date of Birth/Sex: 1930/03/22 (84 y.o. F) Treating RN: Rema [name] Primary Care Tyjuan Demetro: Aram Beecham Other Clinician: Referring Keylon Labelle: Aram Beecham Treating Kiante Petrovich/Extender: Altamese [place] in Treatment: 36 Electronic Signature(s) Signed: 10/10/2018 3:57:54 PM By: Rema [name] Entered By: Rema [name] on 10/10/2018 11:27:16 [name] ([id number]) -------------------------------------------------------------------------------- Multi Wound Chart Details Patient Name: [name] Date of Service: 10/10/2018 11:15 AM Medical Record Number: [id number] Patient Account Number: [id number] Date of Birth/Sex: 17-Dec-1929 (84 y.o. F) Treating RN: Huel Coventry Primary Care Lyra Alaimo: Aram Beecham Other Clinician: Referring Genelda Roark: Aram Beecham Treating Makenzey Nanni/Extender: Altamese [place] in Treatment: 25 Vital Signs Height(in): 60 Pulse(bpm): 66 Weight(lbs): 123 Blood Pressure(mmHg): 120/42 Body Mass Index(BMI): 24 Temperature(F): 98.0 Respiratory Rate 18 (breaths/min): Photos: [N/A:N/A] Wound Location: Left Elbow N/A N/A Wounding Event: Trauma N/A N/A Primary Etiology: Trauma, Other N/A N/A Comorbid History: Cataracts, Glaucoma, Optic N/A N/A Neuritis, Chronic sinus problems/congestion, Middle ear problems Date  Acquired: 01/26/2018 N/A N/A Weeks of Treatment: 23 N/A N/A Wound Status: Open N/A N/A Measurements L x W x D 0.8x0.6x0.3 N/A N/A (cm) Area (cm) : 0.377 N/A N/A Volume (cm) : 0.113 N/A N/A % Reduction in Area: -431.00% N/A N/A % Reduction in Volume: -707.10% N/A N/A Starting Position 1 3 (o'clock): Ending Position 1 9 (o'clock): Maximum Distance 1 (cm): 0.5 Undermining: Yes N/A N/A Classification: Full Thickness With Exposed N/A N/A Support Structures Exudate Amount: Large N/A N/A Exudate Type: Serous N/A N/A Exudate Color: amber N/A N/A Wound Margin: Distinct, outline attached N/A N/A Granulation Amount: None Present (0%) N/A  [name], [name] ([id number]) Necrotic Amount: None Present (0%) N/A N/A Exposed Structures: Fat Layer (Subcutaneous N/A N/A Tissue) Exposed: Yes Fascia: No Tendon: No Muscle: No Joint: No Bone: No Epithelialization: None N/A N/A Periwound Skin Texture: Excoriation: No N/A N/A Induration: No Callus: No Crepitus: No Rash: No Scarring: No Periwound Skin Moisture: Maceration: No N/A N/A Dry/Scaly: No Periwound Skin Color: Erythema: Yes N/A N/A Atrophie Blanche: No Cyanosis: No Ecchymosis: No Hemosiderin Staining: No Mottled: No Pallor: No Rubor: No Erythema Location: Circumferential N/A N/A Temperature: No Abnormality N/A N/A Tenderness on Palpation: Yes N/A N/A Wound Preparation: Ulcer Cleansing: N/A N/A Rinsed/Irrigated with Saline Topical Anesthetic Applied: Other: lidocaine 4% Procedures Performed: Cellular or Tissue Based N/A N/A Product Treatment Notes Wound #9 (Left Elbow) 1. Cleansed with: Clean wound with Normal Saline 2. Anesthetic Topical Lidocaine 4% cream to wound bed prior to debridement 3. Peri-wound Care: Skin Prep Notes grafix pl applied in clinic today, mepitel, steri strips, drawtex and telfa [employer]) Signed: 10/10/2018 5:32:05 PM By: Baltazar Najjar MD Entered By: Baltazar Najjar on 10/10/2018 13:27:09 [name] ([id number]) -------------------------------------------------------------------------------- Multi-Disciplinary Care Plan Details Patient Name: [name] Date of Service: 10/10/2018 11:15 AM Medical Record Number: [id number] Patient Account Number: [id number] Date of Birth/Sex: 1930-03-16 (84 y.o. F) Treating RN: Huel Coventry Primary Care Bran Aldridge: Aram Beecham Other Clinician: Referring Murlene Revell: Aram Beecham Treating Lucian Baswell/Extender: Altamese [place] in Treatment: 55 Active Inactive ` Soft Tissue Infection Nursing Diagnoses: Impaired tissue integrity Goals: Patient's soft tissue infection will resolve Date Initiated: 07/18/2018 Target Resolution Date: 07/27/2018 Goal Status: Active Interventions: Assess signs and symptoms of infection every visit Treatment Activities: Culture and sensitivity : 07/18/2018 Systemic antibiotics : 07/18/2018 Notes: ` Wound/Skin Impairment Nursing Diagnoses: Impaired tissue integrity Knowledge deficit related to ulceration/compromised skin integrity Goals: Patient/caregiver will verbalize understanding of skin care regimen Date Initiated: 11/14/2017 Target Resolution Date: 11/28/2017 Goal Status: Active Ulcer/skin breakdown will have a volume reduction of 30% by week 4 Date Initiated: 11/14/2017 Target Resolution Date: 11/28/2017 Goal Status: Active Interventions: Assess patient/caregiver ability to obtain necessary supplies Assess patient/caregiver ability to perform ulcer/skin care regimen upon admission and as needed Assess ulceration(s) every visit Treatment Activities: Skin care regimen initiated : 11/14/2017 [name], [name] ([id number]) Notes: Electronic Signature(s) Signed: 10/11/2018 7:26:08 AM By: Elliot Gurney, BSN, RN, CWS, Kim RN, BSN Entered By: Elliot Gurney, BSN, RN, CWS, Kim on 10/10/2018 11:38:57 [name]  ([id number]) -------------------------------------------------------------------------------- Pain Assessment Details Patient Name: [name] Date of Service: 10/10/2018 11:15 AM Medical Record Number: [id number] Patient Account Number: [id number] Date of Birth/Sex: 06-16-1930 (84 y.o. F) Treating RN: Rema [name] Primary Care Isley Weisheit: Aram Beecham Other Clinician: Referring Thomas Rhude: Aram Beecham Treating Ermagene Saidi/Extender: Altamese [place] in Treatment: 107 Active Problems Location of Pain Severity and Description of Pain Patient Has Paino No Site Locations Pain Management and Medication Current Pain Management: Goals for Pain Management pt denies any pain at this time. Electronic Signature(s) Signed: 10/10/2018 3:57:54 PM By: Rema [name] Entered By: Rema [name] on 10/10/2018 11:17:00 [name] ([id number]) -------------------------------------------------------------------------------- Patient/Caregiver Education Details Patient Name: [name] Date of Service: 10/10/2018 11:15 AM Medical Record Number: [id number] Patient Account Number: [id number] Date of Birth/Gender: 1930/08/30 (84 y.o. F) Treating RN: Huel Coventry Primary Care Physician: Aram Beecham Other Clinician: Referring Physician: Aram Beecham Treating Physician/Extender: Altamese [place] in Treatment: 29 Education Assessment Education Provided To: Patient Education Topics Provided Wound/Skin Impairment: Handouts: Other: change cover bandage if needed Methods:  Explain/Verbal Responses: State content correctly Electronic Signature(s) Signed: 10/11/2018 7:26:08 AM By: Elliot Gurney, BSN, RN, CWS, Kim RN, BSN Entered By: Elliot Gurney, BSN, RN, CWS, Kim on 10/10/2018 11:57:56 [name] ([id number]) -------------------------------------------------------------------------------- Wound Assessment Details Patient Name: [name] Date of Service: 10/10/2018 11:15 AM Medical  Record Number: [id number] Patient Account Number: [id number] Date of Birth/Sex: [birth year] (84 y.o. F) Treating RN: Rema [name] Primary Care Catheryne Deford: Aram Beecham Other Clinician: Referring Tazaria Dlugosz: Aram Beecham Treating Hilding Quintanar/Extender: Altamese [place] in Treatment: 14 Wound Status Wound Number: 9 Primary Trauma, Other Etiology: Wound Location: Left Elbow Wound Open Wounding Event: Trauma Status: Date Acquired: 01/26/2018 Comorbid Cataracts, Glaucoma, Optic Neuritis, Chronic Weeks Of Treatment: 23 History: sinus problems/congestion, Middle ear problems Clustered Wound: No Photos Photo Uploaded By: Rema [name] on 10/10/2018 11:31:23 Wound Measurements Length: (cm) 0.8 Width: (cm) 0.6 Depth: (cm) 0.3 Area: (cm) 0.377 Volume: (cm) 0.113 % Reduction in Area: -431% % Reduction in Volume: -707.1% Epithelialization: None Tunneling: No Undermining: Yes Starting Position (o'clock): 3 Ending Position (o'clock): 9 Maximum Distance: (cm) 0.5 Wound Description Full Thickness With Exposed Support Foul Odo Classification: Structures Slough/F Wound Margin: Distinct, outline attached Exudate Large Amount: Exudate Type: Serous Exudate Color: amber r After Cleansing: No ibrino No Wound Bed Granulation Amount: None Present (0%) Exposed Structure Necrotic Amount: None Present (0%) Fascia Exposed: No Fat Layer (Subcutaneous Tissue) Exposed: Yes [name], [name] ([id number]) Tendon Exposed: No Muscle Exposed: No Joint Exposed: No Bone Exposed: No Periwound Skin Texture Texture Color No Abnormalities Noted: No No Abnormalities Noted: No Callus: No Atrophie Blanche: No Crepitus: No Cyanosis: No Excoriation: No Ecchymosis: No Induration: No Erythema: Yes Rash: No Erythema Location: Circumferential Scarring: No Hemosiderin Staining: No Mottled: No Moisture Pallor: No No Abnormalities Noted: No Rubor: No Dry / Scaly: No Maceration: No Temperature /  Pain Temperature: No Abnormality Tenderness on Palpation: Yes Wound Preparation Ulcer Cleansing: Rinsed/Irrigated with Saline Topical Anesthetic Applied: Other: lidocaine 4%, Treatment Notes Wound #9 (Left Elbow) 1. Cleansed with: Clean wound with Normal Saline 2. Anesthetic Topical Lidocaine 4% cream to wound bed prior to debridement 3. Peri-wound Care: Skin Prep Notes grafix pl applied in clinic today, mepitel, steri strips, drawtex and telfa [employer]) Signed: 10/10/2018 3:57:54 PM By: Rema [name] Entered By: Rema [name] on 10/10/2018 11:27:03 [name] ([id number]) -------------------------------------------------------------------------------- Vitals Details Patient Name: [name] Date of Service: 10/10/2018 11:15 AM Medical Record Number: [id number] Patient Account Number: [id number] Date of Birth/Sex: 1930-09-01 (84 y.o. F) Treating RN: Rema [name] Primary Care Arelene Moroni: Aram Beecham Other Clinician: Referring Adean Milosevic: Aram Beecham Treating Aladdin Kollmann/Extender: Altamese [place] in Treatment: 32 Vital Signs Time Taken: 11:11 Temperature (F): 98.0 Height (in): 60 Pulse (bpm): 66 Weight (lbs): 123 Respiratory Rate (breaths/min): 18 Body Mass Index (BMI): 24 Blood Pressure (mmHg): 120/42 Reference Range: 80 - 120 mg / dl Electronic Signature(s) Signed: 10/10/2018 3:57:54 PM By: Rema [name] Entered ByRema [name] on 10/10/2018 11:18:53

## 2018-10-15 ENCOUNTER — Ambulatory Visit: Payer: Medicare Other

## 2018-10-16 ENCOUNTER — Ambulatory Visit: Payer: Medicare Other

## 2018-10-17 ENCOUNTER — Encounter: Payer: Medicare Other | Attending: Internal Medicine | Admitting: Internal Medicine

## 2018-10-17 ENCOUNTER — Ambulatory Visit: Payer: Medicare Other

## 2018-10-17 DIAGNOSIS — Z79899 Other long term (current) drug therapy: Secondary | ICD-10-CM | POA: Diagnosis not present

## 2018-10-17 DIAGNOSIS — M069 Rheumatoid arthritis, unspecified: Secondary | ICD-10-CM | POA: Diagnosis not present

## 2018-10-17 DIAGNOSIS — T8189XA Other complications of procedures, not elsewhere classified, initial encounter: Secondary | ICD-10-CM | POA: Diagnosis not present

## 2018-10-17 DIAGNOSIS — S51002A Unspecified open wound of left elbow, initial encounter: Secondary | ICD-10-CM | POA: Diagnosis present

## 2018-10-17 DIAGNOSIS — Z09 Encounter for follow-up examination after completed treatment for conditions other than malignant neoplasm: Secondary | ICD-10-CM | POA: Diagnosis not present

## 2018-10-17 DIAGNOSIS — Y838 Other surgical procedures as the cause of abnormal reaction of the patient, or of later complication, without mention of misadventure at the time of the procedure: Secondary | ICD-10-CM | POA: Insufficient documentation

## 2018-10-18 ENCOUNTER — Ambulatory Visit: Payer: Medicare Other | Attending: Neurology

## 2018-10-18 DIAGNOSIS — M545 Low back pain, unspecified: Secondary | ICD-10-CM

## 2018-10-18 DIAGNOSIS — G8929 Other chronic pain: Secondary | ICD-10-CM | POA: Insufficient documentation

## 2018-10-18 DIAGNOSIS — R262 Difficulty in walking, not elsewhere classified: Secondary | ICD-10-CM | POA: Insufficient documentation

## 2018-10-18 DIAGNOSIS — R2681 Unsteadiness on feet: Secondary | ICD-10-CM | POA: Diagnosis present

## 2018-10-18 DIAGNOSIS — Z9181 History of falling: Secondary | ICD-10-CM | POA: Diagnosis present

## 2018-10-18 DIAGNOSIS — M6281 Muscle weakness (generalized): Secondary | ICD-10-CM

## 2018-10-18 NOTE — Therapy (Signed)
[facility] [address] [place], [address] Phone: [phone]   Fax:  [phone]  Physical Therapy Treatment And Progress Report (09/06/2018 to 10/18/2018)  Patient Details  Name: [name] MRN: [id number] Date of Birth: May 17, 1930 Referring Provider (PT): Jennings Books, MD   Encounter Date: 10/18/2018  PT End of Session - 10/18/18 1532    Visit Number  9    Number of Visits  17    Date for PT Re-Evaluation  11/01/18    Authorization Type  9    Authorization Time Period  of 10 progress report    PT Start Time  1533    PT Stop Time  1621    PT Time Calculation (min)  48 min    Equipment Utilized During Treatment  Gait belt    Activity Tolerance  Patient tolerated treatment well    Behavior During Therapy  [facility] for tasks assessed/performed       Past Medical History:  Diagnosis Date  . Anemia   . GERD (gastroesophageal reflux disease)   . Hypertension   . Peripheral vascular disease (HCC)    possible neuropathies in lower extremeties  . PONV (postoperative nausea and vomiting)    happens sometimes but better with pre med of zofran  . Presence of permanent cardiac pacemaker   . Syncope     Past Surgical History:  Procedure Laterality Date  . ABDOMINAL HYSTERECTOMY  1969  . APPENDECTOMY  1969   with hysterectomy  . BACK SURGERY  2001   rods in back. surgery on back x 3  . COLONOSCOPY    . I&D EXTREMITY Left 03/27/2018   Procedure: IRRIGATION AND DEBRIDEMENT LEFT ELBOW / OLECRANON BURSA;  Surgeon: Corky Mull, MD;  Location: [facility] ORS;  Service: Orthopedics;  Laterality: Left;  . I&D EXTREMITY Left 06/05/2018   Procedure: IRRIGATION AND DEBRIDEMENT EXTREMITY;  Surgeon: Corky Mull, MD;  Location: [facility] ORS;  Service: Orthopedics;  Laterality: Left;  . INSERT / REPLACE / REMOVE PACEMAKER  2004  . JOINT REPLACEMENT Left 2004   partial knee replacement  . OLECRANON BURSECTOMY Left 03/27/2018    Procedure: LEFT OLECRANON BURSA;  Surgeon: Corky Mull, MD;  Location: [facility] ORS;  Service: Orthopedics;  Laterality: Left;  . PACEMAKER INSERTION  2014   dual chamber for complete heart block    There were no vitals filed for this visit.  Subjective Assessment - 10/18/18 1534    Subjective  Pt states that walking is bad in the morning. A neurologist told her that her neuropathy is coming from her back. Feels like her L LE is weak. Feels numbness at L foot and ankle.   No back pain currently.  5-6/10 back pain at most for the past 7 days.     Pertinent History  Imparied gait. Pt states that she has had balance issues for too long. Feels more comfortable walking with her rw. Had tests which showed that she has neuropathy. However the doctor at [facility] said that her balance is coming from her back. Just wants to be strong so she can walk.  Started walking with her rw off and on for the past several months.  Also feels like she might need a new sleep number bed. Also had 2 wound surgeries at [name]. Pt fell in February 2019. Had to stop PT due to the L elbow surgery April 06, 2018 to remove a bursa.  The wound still has not healed.   Currently sees a wound doctor.   Feels numbness L foot from the ankle down.  No R foot numbness.  Sometimes unsure of where her L LE is in space.  Pt also states that she tires too quickly.  Has not been able to walk her dog in 2 years. Has not really been doing exercises at home except the sit <> stand. Pt states that prolonged sitting without back support will increase back pain     Limitations  Standing;Walking    How long can you sit comfortably?  1 hr    How long can you stand comfortably?  10 minutes    How long can you walk comfortably?  2 or 3 blocks    Diagnostic tests  EMG testing BLEs and BUEs which were negative    Patient Stated Goals  Improve LE strength. Wants to use the machines at the gym. Walk more steadily. Be able to take her dog (16 lbs) out  on a leash.     Currently in Pain?  No/denies    Pain Score  0-No pain    Pain Onset  More than a month ago         [facility] PT Assessment - 10/18/18 0001      Observation/Other Assessments   Observations  10 meter walk test with rw: 0.63 m/second average                            PT Education - 10/18/18 1844    Education provided  Yes    Education Details  ther-ex, HEP, neuropathy    Person(s) Educated  Patient    Methods  Explanation;Demonstration;Verbal cues;Tactile cues;Handout    Comprehension  Returned demonstration;Verbalized understanding      Objectives  Pt ambulating with rw  TTP bilateral greater trochanter L >R   L elbow wound  Medbridge Access Code: [email or web address]    Ther-ex  CGA to SBA with standing activities.  Time taken to answer pt question/education on neuropathy  Seated manually resisted trunk flexion isometrics 10x3 with 5 second holds  Seated scapular retraction resisting red band 10x5 seconds sitting to promote thoracic extension  Then 10x2 with 5 second holds in standing.   Side stepping 38f to the R and 5 ft to the L 8x  Standing hip abduction with B UE assist 10x2 each LE  Reviewed HEP. Pt demonstrated and verbalized understanding.   Sit <> stand from NuStep chair without UE assist 8x, then 5x2  Emphasis on placing and maintaining her center of gravity over her base of support and for femoral control.   Gait x 10 meters with rw 2x  15.86 seconds (0.63 m/s), 15.91 seconds (0.63 m/s)    Improved exercise technique, movement at target joints, use of target muscles after mod verbal, visual, tactile cues.     Continued working on thoracic extension, trunk and glute muscle strengthening to promote ability to perform standing tasks with less back pain as well as promote ability to ambulate with less fall risk. Pt demonstrates improved 10 meter walk speed using  her rw as well as decreased overall back pain at worst  since initial evaluation. Pt making some progress with PT towards goals. Pt will benefit from continued skilled physical therapy services to continue to decrease back pain, improve LE strength, balance, and ability to ambulate with least restrictive assistive device.  PT Short Term Goals - 10/18/18 1850      PT SHORT TERM GOAL #1   Title  Patient will be independent with her HEP to improve strength and balance.     Time  3    Period  Weeks    Status  On-going    Target Date  09/27/18        PT Long Term Goals - 10/18/18 1851      PT LONG TERM GOAL #1   Title  Patient will improve her 10 MWT to 0.8 m/s or more with rw to promote better community ambulation.     Baseline  0.5 seconds with rw (09/06/2018); 0.63 seconds average with rw (10/18/2018)    Time  8    Period  Weeks    Status  On-going    Target Date  11/01/18      PT LONG TERM GOAL #2   Title  Patient will improve her DGI score using rw or least restrictive AD to 19/24 or more to promote balance.     Baseline  13/24 using rw (09/06/2018)    Time  8    Period  Weeks    Status  On-going    Target Date  11/01/18      PT LONG TERM GOAL #3   Title  Patient will improve bilateral LE strength by at least 1/2 MMT to promote ability to support herself when performing standing tasks and improve balance.     Time  8    Period  Weeks    Status  On-going    Target Date  11/01/18      PT LONG TERM GOAL #4   Title  Patient will have a decrease in back pain/ache to 5/10 or less to promote ability to perform standing tasks with less difficulty.    Baseline  8/10 at most for the past 3 months (09/06/2018); 5-6/10 at worst for the past 7 days (10/18/2018)    Time  8    Period  Weeks    Status  Partially Met    Target Date  11/01/18            Plan - 10/18/18 1532    Clinical Impression Statement  Continued working on thoracic extension, trunk and glute muscle strengthening to promote ability to perform standing  tasks with less back pain as well as promote ability to ambulate with less fall risk. Pt demonstrates improved 10 meter walk speed using  her rw as well as decreased overall back pain at worst since initial evaluation. Pt making some progress with PT towards goals. Pt will benefit from continued skilled physical therapy services to continue to decrease back pain, improve LE strength, balance, and ability to ambulate with least restrictive assistive device.     History and Personal Factors relevant to plan of care:  Chronicity of condition, back pain, weakness, multiple comorbidities, age    Clinical Presentation  Stable    Clinical Presentation due to:  Pt making some progress with PT towards goals.     Clinical Decision Making  Low    Rehab Potential  Fair    Clinical Impairments Affecting Rehab Potential  (-) chronicity of condition, age, comorbidities; (+) motivated    PT Frequency  2x / week    PT Duration  8 weeks    PT Treatment/Interventions  Aquatic Therapy;Gait training;Therapeutic activities;Therapeutic exercise;Balance training;Neuromuscular re-education;Patient/family education;Manual techniques;Dry needling    PT Next Visit Plan  trunk, hip,  scapular strengthening, balance, gait, manual techniques PRN    PT Home Exercise Plan  --    Consulted and Agree with Plan of Care  Patient       Patient will benefit from skilled therapeutic intervention in order to improve the following deficits and impairments:  Abnormal gait, Decreased activity tolerance, Decreased balance, Decreased endurance, Decreased range of motion, Decreased strength, Difficulty walking, Improper body mechanics, Postural dysfunction, Pain, Decreased knowledge of use of DME  Visit Diagnosis: Unsteadiness on feet  History of falling  Muscle weakness (generalized)  Difficulty in walking, not elsewhere classified  Chronic low back pain, unspecified back pain laterality, unspecified whether sciatica  present     Problem List Patient Active Problem List   Diagnosis Date Noted  . Septic olecranon bursitis of left elbow 03/27/2018  . Lymphedema 10/11/2017  . Chronic venous insufficiency 10/11/2017  . Leg pain 10/11/2017  . Leg swelling 10/11/2017  . COPD (chronic obstructive pulmonary disease) ([place]) 10/11/2017  . Essential hypertension 10/11/2017   Thank you for your referral.  Joneen Boers PT, DPT   10/18/2018, 7:05 PM  [facility] [address], [place], [address] Phone: [phone]   Fax:  [phone]  Name: [name] MRN: [id number] Date of Birth: 13-Oct-1930

## 2018-10-18 NOTE — Patient Instructions (Addendum)
Medbridge Access Code: [email or web address]     Standing scapular retraction, no resistance 10x3 with 5 second holds  Standing Hip Abduction with Counter Support  10x2 each LE

## 2018-10-20 NOTE — Progress Notes (Signed)
Natasha, Barnett ([id number]) Visit Report for 10/17/2018 Arrival Information Details Patient Name: [name], [name] Date of Service: 10/17/2018 11:15 AM Medical Record Number: [id number] Patient Account Number: [id number] Date of Birth/Sex: 08-27-1930 (84 y.o. F) Treating RN: Huel Coventry Primary Care Khaleel Beckom: Aram Beecham Other Clinician: Referring Semaj Coburn: Aram Beecham Treating Novis League/Extender: Altamese [place] in Treatment: 48 Visit Information History Since Last Visit Added or deleted any medications: No Patient Arrived: Walker Any new allergies or adverse reactions: No Arrival Time: 11:15 Had a fall or experienced change in No Accompanied By: self activities of daily living that may affect Transfer Assistance: None risk of falls: Patient Identification Verified: Yes Signs or symptoms of abuse/neglect since last visito No Secondary Verification Process Yes Hospitalized since last visit: No Completed: Implantable device outside of the clinic excluding No Patient Requires Transmission-Based No cellular tissue based products placed in the center Precautions: since last visit: Patient Has Alerts: Yes Has Dressing in Place as Prescribed: Yes Patient Alerts: non compressible left Pain Present Now: No leg Electronic Signature(s) Signed: 10/17/2018 12:16:48 PM By: Dayton Martes RCP, RRT, CHT Entered By: Dayton Martes on 10/17/2018 11:15:48 [name] ([id number]) -------------------------------------------------------------------------------- Encounter Discharge Information Details Patient Name: [name] Date of Service: 10/17/2018 11:15 AM Medical Record Number: [id number] Patient Account Number: [id number] Date of Birth/Sex: 09/03/1930 (84 y.o. F) Treating RN: Huel Coventry Primary Care Kipton Skillen: Aram Beecham Other Clinician: Referring Deandrew Hoecker: Aram Beecham Treating Quintara Bost/Extender: Altamese [place] in  Treatment: 24 Encounter Discharge Information Items Post Procedure Vitals Discharge Condition: Stable Temperature (F): 97.8 Ambulatory Status: Walker Pulse (bpm): 68 Discharge Destination: Home Respiratory Rate (breaths/min): 16 Transportation: Private Auto Blood Pressure (mmHg): 137/49 Accompanied By: self Schedule Follow-up Appointment: Yes Clinical Summary of Care: Electronic Signature(s) Signed: 10/18/2018 10:02:59 AM By: Elliot Gurney, BSN, RN, CWS, Kim RN, BSN Entered By: Elliot Gurney, BSN, RN, CWS, Kim on 10/17/2018 11:45:59 [name] ([id number]) -------------------------------------------------------------------------------- Lower Extremity Assessment Details Patient Name: [name] Date of Service: 10/17/2018 11:15 AM Medical Record Number: [id number] Patient Account Number: [id number] Date of Birth/Sex: 12-31-1929 (84 y.o. F) Treating RN: Curtis Sites Primary Care Yojan Paskett: Aram Beecham Other Clinician: Referring Brach Birdsall: Aram Beecham Treating Marleta Lapierre/Extender: Altamese [place] in Treatment: 48 Electronic Signature(s) Signed: 10/18/2018 5:01:53 PM By: Curtis Sites Entered By: Curtis Sites on 10/17/2018 11:22:50 [name] ([id number]) -------------------------------------------------------------------------------- Multi Wound Chart Details Patient Name: [name] Date of Service: 10/17/2018 11:15 AM Medical Record Number: [id number] Patient Account Number: [id number] Date of Birth/Sex: 10-02-1930 (84 y.o. F) Treating RN: Huel Coventry Primary Care Cowan Pilar: Aram Beecham Other Clinician: Referring Brayn Eckstein: Aram Beecham Treating Kayla Deshaies/Extender: Altamese [place] in Treatment: 48 Vital Signs Height(in): 60 Pulse(bpm): 68 Weight(lbs): 123 Blood Pressure(mmHg): 137/49 Body Mass Index(BMI): 24 Temperature(F): 97.8 Respiratory Rate 18 (breaths/min): Photos: [9:No Photos] [N/A:N/A] Wound Location: [9:Left Elbow]  [N/A:N/A] Wounding Event: [9:Trauma] [N/A:N/A] Primary Etiology: [9:Trauma, Other] [N/A:N/A] Comorbid History: [9:Cataracts, Glaucoma, Optic Neuritis, Chronic sinus problems/congestion, Middle ear problems] [N/A:N/A] Date Acquired: [9:01/26/2018] [N/A:N/A] Weeks of Treatment: [9:24] [N/A:N/A] Wound Status: [9:Open] [N/A:N/A] Measurements L x W x D [9:0.6x0.5x0.2] [N/A:N/A] (cm) Area (cm) : [9:0.236] [N/A:N/A] Volume (cm) : [9:0.047] [N/A:N/A] % Reduction in Area: [9:-232.40%] [N/A:N/A] % Reduction in Volume: [9:-235.70%] [N/A:N/A] Starting Position 1 [9:5] (o'clock): Ending Position 1 [9:8] (o'clock): Maximum Distance 1 (cm): [9:0.4] Undermining: [9:Yes] [N/A:N/A] Classification: [9:Full Thickness With Exposed Support Structures] [N/A:N/A] Exudate Amount: [9:Large] [N/A:N/A] Exudate Type: [9:Serous] [N/A:N/A] Exudate Color: [9:amber] [N/A:N/A] Wound Margin: [9:Distinct, outline  attached] [N/A:N/A] Granulation Amount: [9:Small (1-33%)] [N/A:N/A] Granulation Quality: [9:Pink] [N/A:N/A] Necrotic Amount: [9:Large (67-100%)] [N/A:N/A] Exposed Structures: [9:Fat Layer (Subcutaneous Tissue) Exposed: Yes Fascia: No Tendon: No Muscle: No] [N/A:N/A] Joint: No Bone: No Epithelialization: None N/A N/A Periwound Skin Texture: Excoriation: No N/A N/A Induration: No Callus: No Crepitus: No Rash: No Scarring: No Periwound Skin Moisture: Maceration: No N/A N/A Dry/Scaly: No Periwound Skin Color: Erythema: Yes N/A N/A Atrophie Blanche: No Cyanosis: No Ecchymosis: No Hemosiderin Staining: No Mottled: No Pallor: No Rubor: No Erythema Location: Circumferential N/A N/A Temperature: No Abnormality N/A N/A Tenderness on Palpation: Yes N/A N/A Wound Preparation: Ulcer Cleansing: N/A N/A Rinsed/Irrigated with Saline Topical Anesthetic Applied: Other: lidocaine 4% Procedures Performed: Cellular or Tissue Based N/A N/A Product Treatment Notes Wound #9 (Left Elbow) Notes grafix  pl applied in clinic today, mepitel, steri strips, drawtex and bordered foam dressing Electronic Signature(s) Signed: 10/17/2018 4:33:49 PM By: Baltazar Najjar MD Entered By: Baltazar Najjar on 10/17/2018 12:23:25 [name] ([id number]) -------------------------------------------------------------------------------- Multi-Disciplinary Care Plan Details Patient Name: [name] Date of Service: 10/17/2018 11:15 AM Medical Record Number: [id number] Patient Account Number: [id number] Date of Birth/Sex: 20-Jun-1930 (84 y.o. F) Treating RN: Huel Coventry Primary Care Shady Bradish: Aram Beecham Other Clinician: Referring Hiedi Touchton: Aram Beecham Treating Raelle Chambers/Extender: Altamese [place] in Treatment: 31 Active Inactive ` Soft Tissue Infection Nursing Diagnoses: Impaired tissue integrity Goals: Patient's soft tissue infection will resolve Date Initiated: 07/18/2018 Target Resolution Date: 07/27/2018 Goal Status: Active Interventions: Assess signs and symptoms of infection every visit Treatment Activities: Culture and sensitivity : 07/18/2018 Systemic antibiotics : 07/18/2018 Notes: ` Wound/Skin Impairment Nursing Diagnoses: Impaired tissue integrity Knowledge deficit related to ulceration/compromised skin integrity Goals: Patient/caregiver will verbalize understanding of skin care regimen Date Initiated: 11/14/2017 Target Resolution Date: 11/28/2017 Goal Status: Active Ulcer/skin breakdown will have a volume reduction of 30% by week 4 Date Initiated: 11/14/2017 Target Resolution Date: 11/28/2017 Goal Status: Active Interventions: Assess patient/caregiver ability to obtain necessary supplies Assess patient/caregiver ability to perform ulcer/skin care regimen upon admission and as needed Assess ulceration(s) every visit Treatment Activities: Skin care regimen initiated : 11/14/2017 VERLAINE, EMBRY ([id number]) Notes: Electronic Signature(s) Signed: 10/18/2018 10:02:59  AM By: Elliot Gurney, BSN, RN, CWS, Kim RN, BSN Entered By: Elliot Gurney, BSN, RN, CWS, Kim on 10/17/2018 11:36:00 [name] ([id number]) -------------------------------------------------------------------------------- Pain Assessment Details Patient Name: [name] Date of Service: 10/17/2018 11:15 AM Medical Record Number: [id number] Patient Account Number: [id number] Date of Birth/Sex: 18-May-1930 (84 y.o. F) Treating RN: Huel Coventry Primary Care Heaven Meeker: Aram Beecham Other Clinician: Referring Yoltzin Ransom: Aram Beecham Treating Jacody Beneke/Extender: Altamese [place] in Treatment: 28 Active Problems Location of Pain Severity and Description of Pain Patient Has Paino No Site Locations Pain Management and Medication Current Pain Management: Electronic Signature(s) Signed: 10/17/2018 12:16:48 PM By: Dayton Martes RCP, RRT, CHT Signed: 10/18/2018 10:02:59 AM By: Elliot Gurney, BSN, RN, CWS, Kim RN, BSN Entered By: Dayton Martes on 10/17/2018 11:15:55 [name] ([id number]) -------------------------------------------------------------------------------- Patient/Caregiver Education Details Patient Name: [name] Date of Service: 10/17/2018 11:15 AM Medical Record Number: [id number] Patient Account Number: [id number] Date of Birth/Gender: 1930/01/21 (84 y.o. F) Treating RN: Huel Coventry Primary Care Physician: Aram Beecham Other Clinician: Referring Physician: Aram Beecham Treating Physician/Extender: Altamese [place] in Treatment: 25 Education Assessment Education Provided To: Patient Education Topics Provided Wound/Skin Impairment: Handouts: Other: wound care if needed as ordered Methods: Demonstration, Explain/Verbal Responses: State content correctly Electronic Signature(s) Signed: 10/18/2018 10:02:59 AM By: Elliot Gurney,  BSN, RN, CWS, Kim RN, BSN Entered By: Elliot Gurney, BSN, RN, CWS, Kim on 10/17/2018 11:44:18 [name]  ([id number]) -------------------------------------------------------------------------------- Wound Assessment Details Patient Name: [name] Date of Service: 10/17/2018 11:15 AM Medical Record Number: [id number] Patient Account Number: [id number] Date of Birth/Sex: 1930/11/05 (84 y.o. F) Treating RN: Curtis Sites Primary Care Ahaana Rochette: Aram Beecham Other Clinician: Referring Trystian Crisanto: Aram Beecham Treating Kelcy Laible/Extender: Altamese [place] in Treatment: 48 Wound Status Wound Number: 9 Primary Trauma, Other Etiology: Wound Location: Left Elbow Wound Open Wounding Event: Trauma Status: Date Acquired: 01/26/2018 Comorbid Cataracts, Glaucoma, Optic Neuritis, Chronic Weeks Of Treatment: 24 History: sinus problems/congestion, Middle ear problems Clustered Wound: No Photos Photo Uploaded By: Curtis Sites on 10/17/2018 14:02:06 Wound Measurements Length: (cm) 0.6 Width: (cm) 0.5 Depth: (cm) 0.2 Area: (cm) 0.236 Volume: (cm) 0.047 % Reduction in Area: -232.4% % Reduction in Volume: -235.7% Epithelialization: None Tunneling: No Undermining: Yes Starting Position (o'clock): 5 Ending Position (o'clock): 8 Maximum Distance: (cm) 0.4 Wound Description Full Thickness With Exposed Support Classification: Structures Wound Margin: Distinct, outline attached Exudate Large Amount: Exudate Type: Serous Exudate Color: amber Foul Odor After Cleansing: No Slough/Fibrino No Wound Bed Granulation Amount: Small (1-33%) Exposed Structure Granulation Quality: Pink Fascia Exposed: No Necrotic Amount: Large (67-100%) Fat Layer (Subcutaneous Tissue) Exposed: Yes JOLYN, DESHMUKH ([id number]) Necrotic Quality: Adherent Slough Tendon Exposed: No Muscle Exposed: No Joint Exposed: No Bone Exposed: No Periwound Skin Texture Texture Color No Abnormalities Noted: No No Abnormalities Noted: No Callus: No Atrophie Blanche: No Crepitus: No Cyanosis:  No Excoriation: No Ecchymosis: No Induration: No Erythema: Yes Rash: No Erythema Location: Circumferential Scarring: No Hemosiderin Staining: No Mottled: No Moisture Pallor: No No Abnormalities Noted: No Rubor: No Dry / Scaly: No Maceration: No Temperature / Pain Temperature: No Abnormality Tenderness on Palpation: Yes Wound Preparation Ulcer Cleansing: Rinsed/Irrigated with Saline Topical Anesthetic Applied: Other: lidocaine 4%, Treatment Notes Wound #9 (Left Elbow) Notes grafix pl applied in clinic today, mepitel, steri strips, drawtex and bordered foam dressing Electronic Signature(s) Signed: 10/18/2018 5:01:53 PM By: Curtis Sites Entered By: Curtis Sites on 10/17/2018 11:25:49 [name] ([id number]) -------------------------------------------------------------------------------- Vitals Details Patient Name: [name] Date of Service: 10/17/2018 11:15 AM Medical Record Number: [id number] Patient Account Number: [id number] Date of Birth/Sex: 1930/01/03 (84 y.o. F) Treating RN: Huel Coventry Primary Care Quintell Bonnin: Aram Beecham Other Clinician: Referring Doaa Kendzierski: Aram Beecham Treating Shandra Szymborski/Extender: Altamese [place] in Treatment: 48 Vital Signs Time Taken: 11:12 Temperature (F): 97.8 Height (in): 60 Pulse (bpm): 68 Weight (lbs): 123 Respiratory Rate (breaths/min): 18 Body Mass Index (BMI): 24 Blood Pressure (mmHg): 137/49 Reference Range: 80 - 120 mg / dl Electronic Signature(s) Signed: 10/17/2018 12:16:48 PM By: Dayton Martes RCP, RRT, CHT Entered By: Dayton Martes on 10/17/2018 11:19:08

## 2018-10-20 NOTE — Progress Notes (Signed)
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 to get to a certain point along his healing path before we can initiate the Prisma otherwise it will not be effective. Therefore once the wound became [name], [name] ([id number]) clean it was then time to initiate the Prisma. Nonetheless good news is she is noting excellent improvement she does still have some discomfort but nothing as significant as previously noted. 04/17/18 on evaluation today patient appears to be doing very well and in fact her right lower extremity ulcer has completely healed at this point I'm pleased with this. The left lower extremity ulcer seem to be doing better although she still does have some openings noted the Prisma I think is helping more than the Xeroform was in my pinion. With that being said she still has a lot of healing to do in this regard. 04/27/18 on evaluation today patient appears to be doing very well in regard to her left lower Trinity ulcers. She has been tolerating the dressing changes without complication. I do have a note from her orthopedic surgeon today and they would like for me to help with treating her left elbow surgery site where she had the bursa removed and this was performed roughly  4 weeks ago according to the note that I reviewed. She has been placed on Bactrim DS by need for her leg wounds this probably helped a little bit with the left elbow surgery site. Obviously I do think this is something we can try to help her out with. 05/04/18 on evaluation today patient appears to be doing well in regard to her left anterior lower Trinity ulcers. She is making good progress which is great news. Unfortunately her elbow which we are also managing at this point in time has not made as much progress unfortunately. She has been tolerating the dressing changes without complication. She did see Dr. Darleen Crocker earlier today and he states that he's willing to give this three weeks to see if  she's making any progress with wound care. However he states that she's really not then he will need to go back in and perform further surgery. Obviously she is trying to avoid surgery if at all possible although I'm not sure if this is going to be possible or least not that quickly. 05/11/18 on evaluation today patient appears to be doing very well in regard to her left lower extremity ulcers. Unfortunately in regard to her elbow this is very slow coming about as far as any improvement is concerned. I do feel like there may be a little bit more granulation noted in the base of the wound but nothing too significant unfortunately. I still can probe bone in the proximal portion of the wound which obviously explain to the patient is not good. She will be having a follow-up with her orthopedic surgeon in the next couple of weeks. In the meantime we are trying to do as much as we can to try to show signs of improvement in healing to avoid the need for any additional and further surgery. Nonetheless I explained to the patient yet again today I'm not sure if that is going to be feasible or not obviously it's more risk for her to continue to have an open wound with bone exposure then to the back in for additional  surgery even though I know she doesn't want to go that route. 05/15/18 on evaluation today patient presents for follow-up concerning her ongoing lower extremity ulcers on the left as well as the left elbow ulcer. She has at this point in time been tolerating the dressing changes without complication. Her left lower extremity ulcer appears to be doing very well. In regard to the left elbow ulcer she actually does seem to have additional granulation today which is good news. I am definitely seeing signs of improvement although obviously this is somewhat slow improvement. Nonetheless I'm hopeful we will be able to avoid her having to have any further surgery but again that would definitely be a conversation between herself as well as her surgeon once he sees her for reevaluation. Otherwise she does want to see about having a three order compression stockings for her today 05/21/18 on evaluation today patient appears to be doing well in regard to her left lower surety ulcer. This is almost completely healed and seems to be progressing very nicely. With that being said her left elbow is another story. I'm not really convinced in the past three weeks we've seen a significant improvement in this wound. With that being said if this is something that there is no surgical option for him we have to continue to work on this from the standpoint of conservative management with wound care she may make improvement given time. Nonetheless it appears that her surgeon is somewhat concerned about the possibility of infection and really is leaning towards additional surgery to try and help close this wound. Nonetheless the patient is still unsure of exactly what to do. 05/29/18 on evaluation today patient appears to be doing well in regard to her left lower extremity ulcer. She's been tolerating the dressing changes without complication which is good news. With that being said she's been having issues specifically with her  elbow she did see her surgeon Dr. Joice Lofts and he is recommending a repeat surgery to the left elbow in order to correct the issue. The patient is still somewhat unsure of this but feels like this may be better than trying to take time to  let this heal over a longer period of time through normal wound care measures. Again I explained that I agree this may be a faster way to go if her surgeon feels that this is indeed a good direction to take. Obviously only he can make the judgment on whether or not the surgery would likely be successful. 06/04/18 on evaluation today patient actually presents for follow-up concerning her left elbow and left lower from the ulcer she seems to be doing very well at this point in time. She has been tolerating the dressing changes without complication. With that being said her elbow is not significantly better she actually is scheduled for surgery tomorrow. 07/04/18; the patient had an area on her left leg that is remaining closed. The open area she has now is a postsurgical wound on the left elbow. I think we have clearance from the surgeon to see this now. [address], [place] VMarland Kitchen ([id number]) 07/11/18; we're currently dealing with a surgical wound on the left olecranon process. The patient complains of a lot of pain and drainage. When I saw her last week we did an x-ray that showed soft tissue wound and probable elbow joint effusion but no erosion to suggest osteomyelitis. The culture I did of this was somewhat surprisingly negative. She has a small open wound with not a viable surface there is considerable undermining relative to the wound size. She is on methotrexate for rheumatoid arthritis/overlap syndrome also plaquenil. We've been using silver collagen 07/18/18-She is seen in follow-up evaluation for a left elbow wound. There is essentially no change. She is currently on Zithromax and will complete that on Friday, there is no indication to extend this. We will  change to iodosorb/iodoflex and monitor for response 07/25/18-She is seen in follow-up evaluation for left elbow wound. The wound is stable with no overt evidence of infection. She has counseled with her rheumatologist. She is wanting to restart her methotrexate; a culture was obtained to rule out occult infection before starting her methotrexate. We will continue with Iodosorb/Iodoflex and she will follow-up next week. 08/01/18; this is a difficult wound over her left olecranon process. There is been concerned about infection although cultures including one done last week were negative. Pending 3 weeks ago I gave her an empiric course of antibiotics. She is having a lot of rheumatologic pain in her hands with pain and stiffness. She wants to go on her weekly methotrexate and I think it would be reasonable to do so. We have been using Iodoflex 08/01/18; difficult wound over her left olecranon process. She started back on methotrexate last week because of rheumatologic pain in her hands. We have been using Iodoflex to try and clean out the wound bed. She has been approved for Graphix PL 08/15/18; 2 week follow-up. Difficult wound over her left olecranon process. Graphix PL #1 with collagen backing 08/22/18; one-week follow-up. Difficult wound over her left olecranon process. Graphix PL #2 08/29/18; no major improvement. Difficult wound over her left olecranon process. Still considerable undermining. Graphics PL #3 1 week follow-up. Graphix #4 09/12/18 graphics #5. Some improvement in wound area although the undermining superiorly still has not closed down as much as I would like 09/19/18; Graphix #6 I think there is improvement in the undermining from 7 to 9:00. Wound bed looks healthy. 09/26/18 Graffix #7 undermining is 0.5 cm maximally at roughly 8:00. From 12 to 7:00 the tissue is adherent which is a major improvement there is some advancing skin from this  side. 10/03/18; Graphix #8 no major changes  from last week 10/10/18 Graffix #9 There are improvements. There appears to be granulation coming up to the surface here and there is a lot less undermining at 8:00. 10/17/18. Graffix #10; Dimensions are improved less undermining surface felt the but the wound is still open. Initially a surgical wound following a bursectomy Objective Constitutional Sitting or standing Blood Pressure is within target range for patient.. Pulse regular and within target range for patient.Marland Kitchen Respirations regular, non-labored and within target range.. Temperature is normal and within the target range for the patient.Marland Kitchen appears in no distress. Vitals Time Taken: 11:12 AM, Height: 60 in, Weight: 123 lbs, BMI: 24, Temperature: 97.8 F, Pulse: 68 bpm, Respiratory Rate: 18 breaths/min, Blood Pressure: 137/49 mmHg. General Notes: Wound exam; wound bed is satisfactory. We have had some improvement in this without any doubt but there is still undermining extending from perhaps 6 to 12:00. Applied GRAFfIX in the standard fashion Integumentary (Hair, Skin) Wound #9 status is Open. Original cause of wound was Trauma. The wound is located on the Left Elbow. The wound [name], [name] ([id number]) measures 0.6cm length x 0.5cm width x 0.2cm depth; 0.236cm^2 area and 0.047cm^3 volume. There is Fat Layer (Subcutaneous Tissue) Exposed exposed. There is no tunneling noted, however, there is undermining starting at 5:00 and ending at 8:00 with a maximum distance of 0.4cm. There is a large amount of serous drainage noted. The wound margin is distinct with the outline attached to the wound base. There is small (1-33%) pink granulation within the wound bed. There is a large (67-100%) amount of necrotic tissue within the wound bed including Adherent Slough. The periwound skin appearance exhibited: Erythema. The periwound skin appearance did not exhibit: Callus, Crepitus, Excoriation, Induration, Rash, Scarring, Dry/Scaly, Maceration,  Atrophie Blanche, Cyanosis, Ecchymosis, Hemosiderin Staining, Mottled, Pallor, Rubor. The surrounding wound skin color is noted with erythema which is circumferential. Periwound temperature was noted as No Abnormality. The periwound has tenderness on palpation. 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 A skin graft procedure using a bioengineered skin substitute/cellular or tissue based product was performed by Maxwell Caul, MD with the following instrument(s): Forceps and Scissors. Other was applied and secured with Steri-Strips. 3 sq cm of product was utilized and 0 sq cm was wasted. Post Application, mepitel one was applied. A Time Out was conducted at 11:42, prior to the start of the procedure. The procedure was tolerated well with a pain level of 0 throughout and a pain level of 0 following the procedure. Post procedure Diagnosis Wound #9: Same as Pre-Procedure . Plan Wound Cleansing: Wound #9 Left Elbow: Clean wound with Normal Saline. Anesthetic (add to Medication List): Wound #9 Left Elbow: Topical Lidocaine 4% cream applied to wound bed prior to debridement (In Clinic Only). Skin Barriers/Peri-Wound Care: Wound #9 Left Elbow: Skin Prep [name], [name] ([id number]) Secondary Dressing: Wound #9 Left Elbow: Boardered Foam Dressing Drawtex Dressing Change Frequency: Wound #9 Left Elbow: Change dressing every week - may change outer dressing if it gets soiled or wet Follow-up Appointments: Wound #9 Left Elbow: Return Appointment in 1  week.  Advanced Therapies: Wound #9 Left Elbow: Grafix PL application in clinic; including contact layer, fixation with steri strips, dry gauze and cover dressing. #1 grafix PL #10 #2 we'll see if she is able to have more of this applied. Electronic Signature(s) Signed: 10/17/2018 4:33:49 PM By: Baltazar Najjar MD Entered By: Baltazar Najjar on 10/17/2018 12:28:27 [name] ([id number]) -------------------------------------------------------------------------------- SuperBill Details Patient Name: [name] Date of Service: 10/17/2018 Medical Record Number: [id number] Patient Account Number: [id number] Date of Birth/Sex: [birth year] (84 y.o. 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  without evidence of necrosis CPT4: [id number] Q4133- Grafix PL Prime 1.5 x 2 cm 3 Physician Procedures CPT4: Description Modifier Quantity Code [id number] 15271 - WC PHYS SKIN SUB GRAFT TRNK/ARM/LEG 1 ICD-10 Diagnosis Description S51.002S Unspecified open wound of left elbow, sequela L98.495 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  involvement without evidence of necrosis Electronic Signature(s) Signed: 10/17/2018 4:31:22 PM By: Elliot Gurney, BSN, RN, CWS, Kim RN, BSN Signed: 10/17/2018 4:33:49 PM By: Baltazar Najjar MD Entered By: Elliot Gurney, BSN, RN, CWS, Kim on  10/17/2018 16:31:22

## 2018-10-22 ENCOUNTER — Other Ambulatory Visit: Payer: Self-pay | Admitting: Internal Medicine

## 2018-10-22 ENCOUNTER — Ambulatory Visit: Payer: Medicare Other

## 2018-10-22 DIAGNOSIS — M6281 Muscle weakness (generalized): Secondary | ICD-10-CM

## 2018-10-22 DIAGNOSIS — Z1231 Encounter for screening mammogram for malignant neoplasm of breast: Secondary | ICD-10-CM

## 2018-10-22 DIAGNOSIS — R2681 Unsteadiness on feet: Secondary | ICD-10-CM | POA: Diagnosis not present

## 2018-10-22 DIAGNOSIS — M545 Low back pain: Secondary | ICD-10-CM

## 2018-10-22 DIAGNOSIS — R262 Difficulty in walking, not elsewhere classified: Secondary | ICD-10-CM

## 2018-10-22 DIAGNOSIS — G8929 Other chronic pain: Secondary | ICD-10-CM

## 2018-10-22 DIAGNOSIS — Z9181 History of falling: Secondary | ICD-10-CM

## 2018-10-22 NOTE — Therapy (Signed)
[facility] [address] [place], [address] Phone: [phone]   Fax:  [phone]  Physical Therapy Treatment  Patient Details  Name: [name] MRN: [id number] Date of Birth: July 08, 1930  Referring Provider (PT): Jennings Books, MD   Encounter Date: 10/22/2018  PT End of Session - 10/22/18 1352    Visit Number  10    Number of Visits  17    Date for PT Re-Evaluation  11/01/18    Authorization Type  1    Authorization Time Period  of 10 progress report    PT Start Time  1350    PT Stop Time  1430    PT Time Calculation (min)  40 min    Equipment Utilized During Treatment  Gait belt    Activity Tolerance  Patient tolerated treatment well    Behavior During Therapy  [facility] for tasks assessed/performed       Past Medical History:  Diagnosis Date  . Anemia   . GERD (gastroesophageal reflux disease)   . Hypertension   . Peripheral vascular disease (HCC)    possible neuropathies in lower extremeties  . PONV (postoperative nausea and vomiting)    happens sometimes but better with pre med of zofran  . Presence of permanent cardiac pacemaker   . Syncope     Past Surgical History:  Procedure Laterality Date  . ABDOMINAL HYSTERECTOMY  1969  . APPENDECTOMY  1969   with hysterectomy  . BACK SURGERY  2001   rods in back. surgery on back x 3  . COLONOSCOPY    . I&D EXTREMITY Left 03/27/2018   Procedure: IRRIGATION AND DEBRIDEMENT LEFT ELBOW / OLECRANON BURSA;  Surgeon: Corky Mull, MD;  Location: [facility] ORS;  Service: Orthopedics;  Laterality: Left;  . I&D EXTREMITY Left 06/05/2018   Procedure: IRRIGATION AND DEBRIDEMENT EXTREMITY;  Surgeon: Corky Mull, MD;  Location: [facility] ORS;  Service: Orthopedics;  Laterality: Left;  . INSERT / REPLACE / REMOVE PACEMAKER  2004  . JOINT REPLACEMENT Left 2004   partial knee replacement  . OLECRANON BURSECTOMY Left 03/27/2018   Procedure: LEFT OLECRANON BURSA;  Surgeon:  Corky Mull, MD;  Location: [facility] ORS;  Service: Orthopedics;  Laterality: Left;  . PACEMAKER INSERTION  2014   dual chamber for complete heart block    There were no vitals filed for this visit.  Subjective Assessment - 10/22/18 1352    Subjective  Was sore after last session. Soreness is better today.  No pain currently.     Pertinent History  Imparied gait. Pt states that she has had balance issues for too long. Feels more comfortable walking with her rw. Had tests which showed that she has neuropathy. However the doctor at [facility] said that her balance is coming from her back. Just wants to be strong so she can walk.  Started walking with her rw off and on for the past several months.  Also feels like she might need a new sleep number bed. Also had 2 wound surgeries at [name]. Pt fell in February 2019. Had to stop PT due to the L elbow surgery April 06, 2018 to remove a bursa. The wound still has not healed.   Currently sees a wound doctor.   Feels numbness L foot from the ankle down.  No R foot numbness.  Sometimes unsure of where her L LE is in space.  Pt also states that  she tires too quickly.  Has not been able to walk her dog in 2 years. Has not really been doing exercises at home except the sit <> stand. Pt states that prolonged sitting without back support will increase back pain     Limitations  Standing;Walking    How long can you sit comfortably?  1 hr    How long can you stand comfortably?  10 minutes    How long can you walk comfortably?  2 or 3 blocks    Diagnostic tests  EMG testing BLEs and BUEs which were negative    Patient Stated Goals  Improve LE strength. Wants to use the machines at the gym. Walk more steadily. Be able to take her dog (16 lbs) out on a leash.     Currently in Pain?  No/denies    Pain Score  0-No pain    Pain Onset  More than a month ago                               PT Education - 10/22/18 1356    Education provided   Yes    Education Details  ther-ex    [employer]) Educated  Patient    Methods  Explanation;Demonstration;Tactile cues;Verbal cues    Comprehension  Returned demonstration;Verbalized understanding       Objectives  Pt ambulating with rw  TTP bilateral greater trochanter L >R   L elbow wound   Medbridge Access Code: [email or web address]    Ther-ex  CGA to SBA with standing activities.  Seated manually resisted trunk flexion isometrics 10x3 with 5 second holds  Seated B scapular retraction 10x5 seconds for 3 sets to promote thoracic extension and decrease low back extension pressure   Forward step ups onto Air Ex pad with one UE assist   R 10x2  L 10x2  Side stepping 5 ft to the R and 5 ft to the L 10x (upgraded number or repetitions)  Good glute med muscle use felt  Gait with SPC 20 ft x 4  Standing alternating toe taps onto treadmill platform with B UE assist 10x2 each LE   Then with 2 finger assist 10x each LE    Forward step over 2 fallen canes with one UE assist 5x 2 each LE   Improved exercise technique, movement at target joints, use of target muscles after mod verbal, visual, tactile cues.   Continued working on thoracic extension and trunk strengthening to help decrease pressure to low back. Continued working on LE strengthening as well as balance activities on uneven surface, obstacle negotiation and gait with least restrictive AD to help decrease fall risk. Pt tolerated session well without aggravation of symptoms.         PT Short Term Goals - 10/18/18 1850      PT SHORT TERM GOAL #1   Title  Patient will be independent with her HEP to improve strength and balance.     Time  3    Period  Weeks    Status  On-going    Target Date  09/27/18        PT Long Term Goals - 10/18/18 1851      PT LONG TERM GOAL #1   Title  Patient will improve her 10 MWT to 0.8 m/s or more with rw to promote better community ambulation.     Baseline  0.5 seconds  with rw (09/06/2018); 0.63 seconds  average with rw (10/18/2018)    Time  8    Period  Weeks    Status  On-going    Target Date  11/01/18      PT LONG TERM GOAL #2   Title  Patient will improve her DGI score using rw or least restrictive AD to 19/24 or more to promote balance.     Baseline  13/24 using rw (09/06/2018)    Time  8    Period  Weeks    Status  On-going    Target Date  11/01/18      PT LONG TERM GOAL #3   Title  Patient will improve bilateral LE strength by at least 1/2 MMT to promote ability to support herself when performing standing tasks and improve balance.     Time  8    Period  Weeks    Status  On-going    Target Date  11/01/18      PT LONG TERM GOAL #4   Title  Patient will have a decrease in back pain/ache to 5/10 or less to promote ability to perform standing tasks with less difficulty.    Baseline  8/10 at most for the past 3 months (09/06/2018); 5-6/10 at worst for the past 7 days (10/18/2018)    Time  8    Period  Weeks    Status  Partially Met    Target Date  11/01/18            Plan - 10/22/18 1410    Clinical Impression Statement  Continued working on thoracic extension and trunk strengthening to help decrease pressure to low back. Continued working on LE strengthening as well as balance activities on uneven surface, obstacle negotiation and gait with least restrictive AD to help decrease fall risk. Pt tolerated session well without aggravation of symptoms.     Rehab Potential  Fair    Clinical Impairments Affecting Rehab Potential  (-) chronicity of condition, age, comorbidities; (+) motivated    PT Frequency  2x / week    PT Duration  8 weeks    PT Treatment/Interventions  Aquatic Therapy;Gait training;Therapeutic activities;Therapeutic exercise;Balance training;Neuromuscular re-education;Patient/family education;Manual techniques;Dry needling    PT Next Visit Plan  trunk, hip, scapular strengthening, balance, gait, manual techniques PRN     Consulted and Agree with Plan of Care  Patient       Patient will benefit from skilled therapeutic intervention in order to improve the following deficits and impairments:  Abnormal gait, Decreased activity tolerance, Decreased balance, Decreased endurance, Decreased range of motion, Decreased strength, Difficulty walking, Improper body mechanics, Postural dysfunction, Pain, Decreased knowledge of use of DME  Visit Diagnosis: Unsteadiness on feet  History of falling  Muscle weakness (generalized)  Difficulty in walking, not elsewhere classified  Chronic low back pain, unspecified back pain laterality, unspecified whether sciatica present     Problem List Patient Active Problem List   Diagnosis Date Noted  . Septic olecranon bursitis of left elbow 03/27/2018  . Lymphedema 10/11/2017  . Chronic venous insufficiency 10/11/2017  . Leg pain 10/11/2017  . Leg swelling 10/11/2017  . COPD (chronic obstructive pulmonary disease) ([place]) 10/11/2017  . Essential hypertension 10/11/2017   Joneen Boers PT, DPT   10/22/2018, 8:57 PM  [facility] [address], [place], [address] Phone: [phone]   Fax:  [phone]  Name: [name] MRN: [id number] Date of Birth: Dec 01, 1930

## 2018-10-24 ENCOUNTER — Encounter: Payer: Medicare Other | Admitting: Internal Medicine

## 2018-10-24 DIAGNOSIS — T8189XA Other complications of procedures, not elsewhere classified, initial encounter: Secondary | ICD-10-CM | POA: Diagnosis not present

## 2018-10-25 ENCOUNTER — Ambulatory Visit: Payer: Medicare Other

## 2018-10-25 DIAGNOSIS — M545 Low back pain, unspecified: Secondary | ICD-10-CM

## 2018-10-25 DIAGNOSIS — Z9181 History of falling: Secondary | ICD-10-CM

## 2018-10-25 DIAGNOSIS — R2681 Unsteadiness on feet: Secondary | ICD-10-CM

## 2018-10-25 DIAGNOSIS — R262 Difficulty in walking, not elsewhere classified: Secondary | ICD-10-CM

## 2018-10-25 DIAGNOSIS — M6281 Muscle weakness (generalized): Secondary | ICD-10-CM

## 2018-10-25 DIAGNOSIS — G8929 Other chronic pain: Secondary | ICD-10-CM

## 2018-10-25 NOTE — Therapy (Signed)
[facility] [address] [place], [address] Phone: [phone]   Fax:  [phone]  Physical Therapy Treatment  Patient Details  Name: [name] MRN: [id number] Date of Birth: 11/29/1930 Referring Provider (PT): Jennings Books, MD   Encounter Date: 10/25/2018  PT End of Session - 10/25/18 1515    Visit Number  11    Number of Visits  17    Date for PT Re-Evaluation  11/01/18    Authorization Type  2    Authorization Time Period  of 10 progress report    PT Start Time  1516    PT Stop Time  1558    PT Time Calculation (min)  42 min    Equipment Utilized During Treatment  Gait belt    Activity Tolerance  Patient tolerated treatment well    Behavior During Therapy  WFL for tasks assessed/performed       Past Medical History:  Diagnosis Date  . Anemia   . GERD (gastroesophageal reflux disease)   . Hypertension   . Peripheral vascular disease (HCC)    possible neuropathies in lower extremeties  . PONV (postoperative nausea and vomiting)    happens sometimes but better with pre med of zofran  . Presence of permanent cardiac pacemaker   . Syncope     Past Surgical History:  Procedure Laterality Date  . ABDOMINAL HYSTERECTOMY  1969  . APPENDECTOMY  1969   with hysterectomy  . BACK SURGERY  2001   rods in back. surgery on back x 3  . COLONOSCOPY    . I&D EXTREMITY Left 03/27/2018   Procedure: IRRIGATION AND DEBRIDEMENT LEFT ELBOW / OLECRANON BURSA;  Surgeon: Corky Mull, MD;  Location: [facility];  Service: Orthopedics;  Laterality: Left;  . I&D EXTREMITY Left 06/05/2018   Procedure: IRRIGATION AND DEBRIDEMENT EXTREMITY;  Surgeon: Corky Mull, MD;  Location: [facility];  Service: Orthopedics;  Laterality: Left;  . INSERT / REPLACE / REMOVE PACEMAKER  2004  . JOINT REPLACEMENT Left 2004   partial knee replacement  . OLECRANON BURSECTOMY Left 03/27/2018   Procedure: LEFT OLECRANON BURSA;  Surgeon:  Corky Mull, MD;  Location: [facility];  Service: Orthopedics;  Laterality: Left;  . PACEMAKER INSERTION  2014   dual chamber for complete heart block    There were no vitals filed for this visit.  Subjective Assessment - 10/25/18 1518    Subjective  Has not had a super day. Too much stuff going on at home. Husband not happy and has dementia and is unreasonable with a lot of things.  No back pain currently.  Was really tired after last session.     Pertinent History  Imparied gait. Pt states that she has had balance issues for too long. Feels more comfortable walking with her rw. Had tests which showed that she has neuropathy. However the doctor at [facility] said that her balance is coming from her back. Just wants to be strong so she can walk.  Started walking with her rw off and on for the past several months.  Also feels like she might need a new sleep number bed. Also had 2 wound surgeries at [name]. Pt fell in February 2019. Had to stop PT due to the L elbow surgery April 06, 2018 to remove a bursa. The wound still has not healed.   Currently sees a wound doctor.   Feels numbness L foot  from the ankle down.  No R foot numbness.  Sometimes unsure of where her L LE is in space.  Pt also states that she tires too quickly.  Has not been able to walk her dog in 2 years. Has not really been doing exercises at home except the sit <> stand. Pt states that prolonged sitting without back support will increase back pain     Limitations  Standing;Walking    How long can you sit comfortably?  1 hr    How long can you stand comfortably?  10 minutes    How long can you walk comfortably?  2 or 3 blocks    Diagnostic tests  EMG testing BLEs and BUEs which were negative    Patient Stated Goals  Improve LE strength. Wants to use the machines at the gym. Walk more steadily. Be able to take her dog (16 lbs) out on a leash.     Currently in Pain?  No/denies    Pain Score  0-No pain    Pain Onset  More than a  month ago         [facility] PT Assessment - 10/25/18 1532      Strength   Right Hip Flexion  4/5    Right Hip Extension  4/5   seated manually resisted   Right Hip ABduction  4/5   clamshell isometric   Left Hip Flexion  4/5    Left Hip Extension  4/5   seated manually resisted   Left Hip ABduction  4/5   clamshell isometric   Right Knee Flexion  4/5    Right Knee Extension  4+/5    Left Knee Flexion  4/5    Left Knee Extension  5/5                           PT Education - 10/25/18 1815    Education provided  Yes    Education Details  ther-ex    Person(s) Educated  Patient    Methods  Explanation;Demonstration;Tactile cues;Verbal cues    Comprehension  Returned demonstration;Verbalized understanding        Objectives  Pt ambulating with rw  TTP bilateral greater trochanter L >R   L elbow wound   No latex band allergies   MedbridgeAccess Code: [email or web address]   Ther-ex  CGAto SBAwith standing activities.  Seated manually resisted trunk flexion isometrics 10x3 with 5 second holds  Seated B scapular retraction 10x5 seconds for 3 sets to promote thoracic extension and decrease low back extension pressure   3rd set with feet propped on 4 inch step. R hip did not bother pt afterwards with exercise  Seated manually resisted hip flexion, hip extension, clamshell isometrics, knee flexion, knee extension 1-2x each way for each LE  Reviewed progress/current status with LE strength with pt.    Seated clamshell, hips less than 90 degrees flexion resisting red band 10x3  Forward step ups onto Air Ex pad with one UE assist              R 10x2             L 10x2  Gait with SPC 20 ft x 4  Improved exercise technique, movement at target joints, use of target muscles after mod verbal, visual, tactile cues.    Pt demonstrates overall improved LE strength compared to initial evaluation measurements. Decreased R hip symptoms with sitting  and slight low back  flexion. Continued working on thoracic extension, trunk and glute strengthening to help decrease pressure to low back when performing standing tasks as well as to help promote LE strength and balance.    PT Short Term Goals - 10/18/18 1850      PT SHORT TERM GOAL #1   Title  Patient will be independent with her HEP to improve strength and balance.     Time  3    Period  Weeks    Status  On-going    Target Date  09/27/18        PT Long Term Goals - 10/18/18 1851      PT LONG TERM GOAL #1   Title  Patient will improve her 10 MWT to 0.8 m/s or more with rw to promote better community ambulation.     Baseline  0.5 seconds with rw (09/06/2018); 0.63 seconds average with rw (10/18/2018)    Time  8    Period  Weeks    Status  On-going    Target Date  11/01/18      PT LONG TERM GOAL #2   Title  Patient will improve her DGI score using rw or least restrictive AD to 19/24 or more to promote balance.     Baseline  13/24 using rw (09/06/2018)    Time  8    Period  Weeks    Status  On-going    Target Date  11/01/18      PT LONG TERM GOAL #3   Title  Patient will improve bilateral LE strength by at least 1/2 MMT to promote ability to support herself when performing standing tasks and improve balance.     Time  8    Period  Weeks    Status  On-going    Target Date  11/01/18      PT LONG TERM GOAL #4   Title  Patient will have a decrease in back pain/ache to 5/10 or less to promote ability to perform standing tasks with less difficulty.    Baseline  8/10 at most for the past 3 months (09/06/2018); 5-6/10 at worst for the past 7 days (10/18/2018)    Time  8    Period  Weeks    Status  Partially Met    Target Date  11/01/18            Plan - 10/25/18 1508    Clinical Impression Statement  Pt demonstrates overall improved LE strength compared to initial evaluation measurements. Decreased R hip symptoms with sitting and slight low back flexion. Continued working on  thoracic extension, trunk and glute strengthening to help decrease pressure to low back when performing standing tasks as well as to help promote LE strength and balance.     Rehab Potential  Fair    Clinical Impairments Affecting Rehab Potential  (-) chronicity of condition, age, comorbidities; (+) motivated    PT Frequency  2x / week    PT Duration  8 weeks    PT Treatment/Interventions  Aquatic Therapy;Gait training;Therapeutic activities;Therapeutic exercise;Balance training;Neuromuscular re-education;Patient/family education;Manual techniques;Dry needling    PT Next Visit Plan  trunk, hip, scapular strengthening, balance, gait, manual techniques PRN    Consulted and Agree with Plan of Care  Patient       Patient will benefit from skilled therapeutic intervention in order to improve the following deficits and impairments:  Abnormal gait, Decreased activity tolerance, Decreased balance, Decreased endurance, Decreased range of motion, Decreased strength, Difficulty walking,  Improper body mechanics, Postural dysfunction, Pain, Decreased knowledge of use of DME  Visit Diagnosis: Unsteadiness on feet  History of falling  Muscle weakness (generalized)  Difficulty in walking, not elsewhere classified  Chronic low back pain, unspecified back pain laterality, unspecified whether sciatica present     Problem List Patient Active Problem List   Diagnosis Date Noted  . Septic olecranon bursitis of left elbow 03/27/2018  . Lymphedema 10/11/2017  . Chronic venous insufficiency 10/11/2017  . Leg pain 10/11/2017  . Leg swelling 10/11/2017  . COPD (chronic obstructive pulmonary disease) ([place]) 10/11/2017  . Essential hypertension 10/11/2017   Joneen Boers PT, DPT   10/25/2018, 6:24 PM  [facility] [address] [place], [address] Phone: [phone]   Fax:  [phone]  Name: [name] MRN: [id number] Date  of Birth: [birth year]

## 2018-10-26 NOTE — Progress Notes (Signed)
[name], [name] ([id number]) Visit Report for 10/24/2018 Arrival Information Details Patient Name: [name], [name] Date of Service: 10/24/2018 11:15 AM Medical Record Number: [id number] Patient Account Number: [id number] Date of Birth/Sex: Jan 20, 1930 (84 y.o. F) Treating RN: Rema [name] Primary Care Chanee Henrickson: Aram Beecham Other Clinician: Referring Doss Cybulski: Aram Beecham Treating Ahmod Gillespie/Extender: Altamese [place] in Treatment: 32 Visit Information History Since Last Visit Added or deleted any medications: No Patient Arrived: Walker Any new allergies or adverse reactions: No Arrival Time: 11:25 Had a fall or experienced change in No Accompanied By: self activities of daily living that may affect Transfer Assistance: None risk of falls: Patient Identification Verified: Yes Signs or symptoms of abuse/neglect since last visito No Secondary Verification Process Yes Hospitalized since last visit: No Completed: Implantable device outside of the clinic excluding No Patient Requires Transmission-Based No cellular tissue based products placed in the center Precautions: since last visit: Patient Has Alerts: Yes Has Dressing in Place as Prescribed: Yes Patient Alerts: non compressible left Pain Present Now: No leg Electronic Signature(s) Signed: 10/24/2018 11:50:29 AM By: Rema [name] Entered By: Rema [name] on 10/24/2018 11:27:21 [name] ([id number]) -------------------------------------------------------------------------------- Encounter Discharge Information Details Patient Name: [name] Date of Service: 10/24/2018 11:15 AM Medical Record Number: [id number] Patient Account Number: [id number] Date of Birth/Sex: Aug 20, 1930 (84 y.o. F) Treating RN: Huel Coventry Primary Care Grabiela Wohlford: Aram Beecham Other Clinician: Referring Kiyo Heal: Aram Beecham Treating Sheryn Aldaz/Extender: Altamese [place] in Treatment: 45 Encounter Discharge Information Items  Post Procedure Vitals Discharge Condition: Stable Temperature (F): 98.1 Ambulatory Status: Walker Pulse (bpm): 67 Discharge Destination: Home Respiratory Rate (breaths/min): 16 Transportation: Private Auto Blood Pressure (mmHg): 113/74 Accompanied By: self Schedule Follow-up Appointment: Yes Clinical Summary of Care: Electronic Signature(s) Signed: 10/24/2018 5:34:01 PM By: Elliot Gurney, BSN, RN, CWS, Kim RN, BSN Entered By: Elliot Gurney, BSN, RN, CWS, Kim on 10/24/2018 11:55:09 [name] ([id number]) -------------------------------------------------------------------------------- Lower Extremity Assessment Details Patient Name: [name] Date of Service: 10/24/2018 11:15 AM Medical Record Number: [id number] Patient Account Number: [id number] Date of Birth/Sex: 1930/11/26 (84 y.o. F) Treating RN: Rema [name] Primary Care Manolito Jurewicz: Aram Beecham Other Clinician: Referring Islam Eichinger: Aram Beecham Treating Chasitie Passey/Extender: Maxwell Caul Weeks in Treatment: 52 Electronic Signature(s) Signed: 10/24/2018 11:50:29 AM By: Rema [name] Entered By: Rema [name] on 10/24/2018 11:28:12 [name] ([id number]) -------------------------------------------------------------------------------- Multi Wound Chart Details Patient Name: [name] Date of Service: 10/24/2018 11:15 AM Medical Record Number: [id number] Patient Account Number: [id number] Date of Birth/Sex: 1930/09/09 (84 y.o. F) Treating RN: Huel Coventry Primary Care Remmie Bembenek: Aram Beecham Other Clinician: Referring Maxyne Derocher: Aram Beecham Treating Rhythm Wigfall/Extender: Altamese [place] in Treatment: 30 Vital Signs Height(in): 60 Pulse(bpm): 67 Weight(lbs): 123 Blood Pressure(mmHg): 113/74 Body Mass Index(BMI): 24 Temperature(F): 98.1 Respiratory Rate 16 (breaths/min): Photos: [N/A:N/A] Wound Location: Left Elbow N/A N/A Wounding Event: Trauma N/A N/A Primary Etiology: Trauma, Other N/A N/A Comorbid  History: Cataracts, Glaucoma, Optic N/A N/A Neuritis, Chronic sinus problems/congestion, Middle ear problems Date Acquired: 01/26/2018 N/A N/A Weeks of Treatment: 25 N/A N/A Wound Status: Open N/A N/A Measurements L x W x D 0.5x0.5x0.2 N/A N/A (cm) Area (cm) : 0.196 N/A N/A Volume (cm) : 0.039 N/A N/A % Reduction in Area: -176.10% N/A N/A % Reduction in Volume: -178.60% N/A N/A Starting Position 1 3 (o'clock): Ending Position 1 12 (o'clock): Maximum Distance 1 (cm): 0.7 Undermining: Yes N/A N/A Classification: Full Thickness With Exposed N/A N/A Support Structures Exudate Amount: Medium N/A N/A Exudate Type: Serous N/A  N/A Exudate Color: amber N/A N/A Wound Margin: Distinct, outline attached N/A N/A Granulation Amount: None Present (0%) N/A N/A [name] ([id number]) Necrotic Amount: Medium (34-66%) N/A N/A Exposed Structures: Fat Layer (Subcutaneous N/A N/A Tissue) Exposed: Yes Fascia: No Tendon: No Muscle: No Joint: No Bone: No Epithelialization: None N/A N/A Periwound Skin Texture: Excoriation: No N/A N/A Induration: No Callus: No Crepitus: No Rash: No Scarring: No Periwound Skin Moisture: Maceration: No N/A N/A Dry/Scaly: No Periwound Skin Color: Erythema: Yes N/A N/A Atrophie Blanche: No Cyanosis: No Ecchymosis: No Hemosiderin Staining: No Mottled: No Pallor: No Rubor: No Temperature: No Abnormality N/A N/A Tenderness on Palpation: Yes N/A N/A Wound Preparation: Ulcer Cleansing: N/A N/A Rinsed/Irrigated with Saline Topical Anesthetic Applied: Other: lidocaine 4% Procedures Performed: Cellular or Tissue Based N/A N/A Product Treatment Notes Wound #9 (Left Elbow) Notes grafix pl applied in clinic today, mepitel, steri strips, drawtex and bordered foam dressing Electronic Signature(s) Signed: 10/24/2018 5:24:02 PM By: Baltazar Najjar MD Entered By: Baltazar Najjar on 10/24/2018 12:14:00 [name]  ([id number]) -------------------------------------------------------------------------------- Multi-Disciplinary Care Plan Details Patient Name: [name] Date of Service: 10/24/2018 11:15 AM Medical Record Number: [id number] Patient Account Number: [id number] Date of Birth/Sex: 1930-04-18 (84 y.o. F) Treating RN: Huel Coventry Primary Care Nika Yazzie: Aram Beecham Other Clinician: Referring Seirra Kos: Aram Beecham Treating Amily Depp/Extender: Altamese [place] in Treatment: 78 Active Inactive ` Soft Tissue Infection Nursing Diagnoses: Impaired tissue integrity Goals: Patient's soft tissue infection will resolve Date Initiated: 07/18/2018 Target Resolution Date: 07/27/2018 Goal Status: Active Interventions: Assess signs and symptoms of infection every visit Treatment Activities: Culture and sensitivity : 07/18/2018 Systemic antibiotics : 07/18/2018 Notes: ` Wound/Skin Impairment Nursing Diagnoses: Impaired tissue integrity Knowledge deficit related to ulceration/compromised skin integrity Goals: Patient/caregiver will verbalize understanding of skin care regimen Date Initiated: 11/14/2017 Target Resolution Date: 11/28/2017 Goal Status: Active Ulcer/skin breakdown will have a volume reduction of 30% by week 4 Date Initiated: 11/14/2017 Target Resolution Date: 11/28/2017 Goal Status: Active Interventions: Assess patient/caregiver ability to obtain necessary supplies Assess patient/caregiver ability to perform ulcer/skin care regimen upon admission and as needed Assess ulceration(s) every visit Treatment Activities: Skin care regimen initiated : 11/14/2017 DABNEY, DEVER ([id number]) Notes: Electronic Signature(s) Signed: 10/24/2018 5:34:01 PM By: Elliot Gurney, BSN, RN, CWS, Kim RN, BSN Entered By: Elliot Gurney, BSN, RN, CWS, Kim on 10/24/2018 11:43:53 [name] ([id number]) -------------------------------------------------------------------------------- Pain Assessment  Details Patient Name: [name] Date of Service: 10/24/2018 11:15 AM Medical Record Number: [id number] Patient Account Number: [id number] Date of Birth/Sex: 09/16/1930 (84 y.o. F) Treating RN: Rema [name] Primary Care Kacy Conely: Aram Beecham Other Clinician: Referring Orson Rho: Aram Beecham Treating Sebastyan Snodgrass/Extender: Altamese [place] in Treatment: 50 Active Problems Location of Pain Severity and Description of Pain Patient Has Paino No Site Locations Pain Management and Medication Current Pain Management: Goals for Pain Management pt denies any pain at this time. Electronic Signature(s) Signed: 10/24/2018 11:50:29 AM By: Rema [name] Entered By: Rema [name] on 10/24/2018 11:27:41 [name] ([id number]) -------------------------------------------------------------------------------- Patient/Caregiver Education Details Patient Name: [name] Date of Service: 10/24/2018 11:15 AM Medical Record Number: [id number] Patient Account Number: [id number] Date of Birth/Gender: [birth year] (84 y.o. F) Treating RN: Huel Coventry Primary Care Physician: Aram Beecham Other Clinician: Referring Physician: Aram Beecham Treating Physician/Extender: Altamese [place] in Treatment: 66 Education Assessment Education Provided To: Patient Education Topics Provided Wound/Skin Impairment: Handouts: Other: wound care as ordered Methods: Demonstration, Explain/Verbal Responses: State content correctly Electronic Signature(s) Signed: 10/24/2018 5:34:01 PM  By: Elliot Gurney, BSN, RN, CWS, Kim RN, BSN Entered By: Elliot Gurney, BSN, RN, CWS, Kim on 10/24/2018 11:54:02 [name] ([id number]) -------------------------------------------------------------------------------- Wound Assessment Details Patient Name: DANYELL, KROLAK Date of Service: 10/24/2018 11:15 AM Medical Record Number: [id number] Patient Account Number: [id number] Date of Birth/Sex: [birth year] (84 y.o.  F) Treating RN: Rema [name] Primary Care Derrius Furtick: Aram Beecham Other Clinician: Referring Abdulrahman Bracey: Aram Beecham Treating Caitlinn Klinker/Extender: Altamese [place] in Treatment: 49 Wound Status Wound Number: 9 Primary Trauma, Other Etiology: Wound Location: Left Elbow Wound Open Wounding Event: Trauma Status: Date Acquired: 01/26/2018 Comorbid Cataracts, Glaucoma, Optic Neuritis, Chronic Weeks Of Treatment: 25 History: sinus problems/congestion, Middle ear problems Clustered Wound: No Photos Photo Uploaded By: Rema [name] on 10/24/2018 11:39:45 Wound Measurements Length: (cm) 0.5 Width: (cm) 0.5 Depth: (cm) 0.2 Area: (cm) 0.196 Volume: (cm) 0.039 % Reduction in Area: -176.1% % Reduction in Volume: -178.6% Epithelialization: None Tunneling: No Undermining: Yes Starting Position (o'clock): 3 Ending Position (o'clock): 12 Maximum Distance: (cm) 0.7 Wound Description Full Thickness With Exposed Support Foul Odo Classification: Structures Slough/F Wound Margin: Distinct, outline attached Exudate Medium Amount: Exudate Type: Serous Exudate Color: amber r After Cleansing: No ibrino No Wound Bed Granulation Amount: None Present (0%) Exposed Structure Necrotic Amount: Medium (34-66%) Fascia Exposed: No Necrotic Quality: Adherent Slough Fat Layer (Subcutaneous Tissue) Exposed: Yes STEVEN, PLACERES ([id number]) Tendon Exposed: No Muscle Exposed: No Joint Exposed: No Bone Exposed: No Periwound Skin Texture Texture Color No Abnormalities Noted: No No Abnormalities Noted: No Callus: No Atrophie Blanche: No Crepitus: No Cyanosis: No Excoriation: No Ecchymosis: No Induration: No Erythema: Yes Rash: No Hemosiderin Staining: No Scarring: No Mottled: No Pallor: No Moisture Rubor: No No Abnormalities Noted: No Dry / Scaly: No Temperature / Pain Maceration: No Temperature: No Abnormality Tenderness on Palpation: Yes Wound Preparation Ulcer Cleansing:  Rinsed/Irrigated with Saline Topical Anesthetic Applied: Other: lidocaine 4%, Treatment Notes Wound #9 (Left Elbow) Notes grafix pl applied in clinic today, mepitel, steri strips, drawtex and bordered foam dressing Electronic Signature(s) Signed: 10/24/2018 11:50:29 AM By: Rema [name] Entered By: Rema [name] on 10/24/2018 11:34:06 [name] ([id number]) -------------------------------------------------------------------------------- Vitals Details Patient Name: [name] Date of Service: 10/24/2018 11:15 AM Medical Record Number: [id number] Patient Account Number: [id number] Date of Birth/Sex: 10-20-1930 (84 y.o. F) Treating RN: Rema [name] Primary Care Katilynn Sinkler: Aram Beecham Other Clinician: Referring Canden Cieslinski: Aram Beecham Treating Katheleen Stella/Extender: Altamese [place] in Treatment: 35 Vital Signs Time Taken: 11:27 Temperature (F): 98.1 Height (in): 60 Pulse (bpm): 67 Weight (lbs): 123 Respiratory Rate (breaths/min): 16 Body Mass Index (BMI): 24 Blood Pressure (mmHg): 113/74 Reference Range: 80 - 120 mg / dl Electronic Signature(s) Signed: 10/24/2018 11:50:29 AM By: Rema [name] Entered ByRema [name] on 10/24/2018 11:28:04

## 2018-10-29 ENCOUNTER — Ambulatory Visit: Payer: Medicare Other

## 2018-10-29 DIAGNOSIS — R2681 Unsteadiness on feet: Secondary | ICD-10-CM | POA: Diagnosis not present

## 2018-10-29 DIAGNOSIS — M6281 Muscle weakness (generalized): Secondary | ICD-10-CM

## 2018-10-29 DIAGNOSIS — G8929 Other chronic pain: Secondary | ICD-10-CM

## 2018-10-29 DIAGNOSIS — M545 Low back pain, unspecified: Secondary | ICD-10-CM

## 2018-10-29 DIAGNOSIS — Z9181 History of falling: Secondary | ICD-10-CM

## 2018-10-29 DIAGNOSIS — R262 Difficulty in walking, not elsewhere classified: Secondary | ICD-10-CM

## 2018-10-29 NOTE — Therapy (Signed)
[facility] [address] [place], [address] Phone: [phone]   Fax:  [phone]  Physical Therapy Treatment  Patient Details  Name: [name] MRN: [id number] Date of Birth: [birth year] Referring Provider (PT): Jennings Books, MD   Encounter Date: 10/29/2018  PT End of Session - 10/29/18 1354    Visit Number  12    Number of Visits  33    Date for PT Re-Evaluation  12/27/18    Authorization Type  3    Authorization Time Period  of 10 progress report    PT Start Time  1354   pt arrived late   PT Stop Time  1434    PT Time Calculation (min)  40 min    Equipment Utilized During Treatment  Gait belt    Activity Tolerance  Patient tolerated treatment well    Behavior During Therapy  WFL for tasks assessed/performed       Past Medical History:  Diagnosis Date  . Anemia   . GERD (gastroesophageal reflux disease)   . Hypertension   . Peripheral vascular disease (HCC)    possible neuropathies in lower extremeties  . PONV (postoperative nausea and vomiting)    happens sometimes but better with pre med of zofran  . Presence of permanent cardiac pacemaker   . Syncope     Past Surgical History:  Procedure Laterality Date  . ABDOMINAL HYSTERECTOMY  1969  . APPENDECTOMY  1969   with hysterectomy  . BACK SURGERY  2001   rods in back. surgery on back x 3  . COLONOSCOPY    . I&D EXTREMITY Left 03/27/2018   Procedure: IRRIGATION AND DEBRIDEMENT LEFT ELBOW / OLECRANON BURSA;  Surgeon: Corky Mull, MD;  Location: [facility];  Service: Orthopedics;  Laterality: Left;  . I&D EXTREMITY Left 06/05/2018   Procedure: IRRIGATION AND DEBRIDEMENT EXTREMITY;  Surgeon: Corky Mull, MD;  Location: [facility];  Service: Orthopedics;  Laterality: Left;  . INSERT / REPLACE / REMOVE PACEMAKER  2004  . JOINT REPLACEMENT Left 2004   partial knee replacement  . OLECRANON BURSECTOMY Left 03/27/2018   Procedure: LEFT OLECRANON  BURSA;  Surgeon: Corky Mull, MD;  Location: [facility];  Service: Orthopedics;  Laterality: Left;  . PACEMAKER INSERTION  2014   dual chamber for complete heart block    There were no vitals filed for this visit.  Subjective Assessment - 10/29/18 1357    Subjective  Back is sore. Arms are sore as well. Going to schedule an appointment with a plastic surgoen for her non-healing elbow wound.  Walking is ok.  Pt states she wants to continue PT.     Pertinent History  Imparied gait. Pt states that she has had balance issues for too long. Feels more comfortable walking with her rw. Had tests which showed that she has neuropathy. However the doctor at [facility] said that her balance is coming from her back. Just wants to be strong so she can walk.  Started walking with her rw off and on for the past several months.  Also feels like she might need a new sleep number bed. Also had 2 wound surgeries at [name]. Pt fell in February 2019. Had to stop PT due to the L elbow surgery April 06, 2018 to remove a bursa. The wound still has not healed.   Currently sees a wound doctor.   Feels numbness L foot from  the ankle down.  No R foot numbness.  Sometimes unsure of where her L LE is in space.  Pt also states that she tires too quickly.  Has not been able to walk her dog in 2 years. Has not really been doing exercises at home except the sit <> stand. Pt states that prolonged sitting without back support will increase back pain     Limitations  Standing;Walking    How long can you sit comfortably?  1 hr    How long can you stand comfortably?  10 minutes    How long can you walk comfortably?  2 or 3 blocks    Diagnostic tests  EMG testing BLEs and BUEs which were negative    Patient Stated Goals  Improve LE strength. Wants to use the machines at the gym. Walk more steadily. Be able to take her dog (16 lbs) out on a leash.     Currently in Pain?  No/denies   no complain of pain, just soreness   Pain Onset   More than a month ago         [facility] PT Assessment - 10/29/18 1410      Dynamic Gait Index   Level Surface  Normal    Change in Gait Speed  Moderate Impairment    Gait with Horizontal Head Turns  Mild Impairment    Gait with Vertical Head Turns  Mild Impairment    Gait and Pivot Turn  Mild Impairment    Step Over Obstacle  Severe Impairment    Step Around Obstacles  Normal    Steps  Moderate Impairment    Total Score  14    DGI comment:  performed with rw                           PT Education - 10/29/18 1408    Education provided  Yes    Education Details  ther-ex    [employer]) Educated  Patient    Methods  Explanation;Demonstration;Tactile cues;Verbal cues    Comprehension  Returned demonstration;Verbalized understanding      Objectives  Pt ambulating with rw  TTP bilateral greater trochanter L >R   L elbow wound  No latex band allergies   MedbridgeAccess Code: [email or web address]   Ther-ex  CGAto SBAwith standing activities.  Sit <> stand throughout session with emphasis on femoral control   Gait with SPC 170 ft with CGA  Side stepping on Air Ex Beam with B UE assist 10x to the L and 10x to the R   Directed patient with gait with normal gait speed, with changes in speed, 180 degree pivot turn, with R and L cervical rotation position, with cervical flexion and extension position, stepping around obstacles, stepping over an obstacle, ascending and descending 4 regular steps with UE assist    Reviewed plan of care: 2x/week for 8 weeks  Stepping over a shoe box with R and L feet with one UE assist 5x each LE  Standing mini squats with B UE assist 10x2  Standing hip abduction 5x5 seconds each LE  Improved exercise technique, movement at target joints, use of target muscles after mod verbal, visual, tactile cues.      Pt demonstrates overall improved B LE strength, gait speed with her rw, and overall decreased back pain  since initial evaluation. Similar DGI score in which decreased confidence in stepping over an obstacle such as a shoe box prevented her  from scoring points. Pt able to step over a shoe box multiple times later in the session. Pt still demonstrates B LE weakness, back pain, decreased balance, and difficulty with gait and would benefit from continued skilled physical therapy services to address the aforementioned deficits.               PT Short Term Goals - 10/18/18 1850      PT SHORT TERM GOAL #1   Title  Patient will be independent with her HEP to improve strength and balance.     Time  3    Period  Weeks    Status  On-going    Target Date  09/27/18        PT Long Term Goals - 10/29/18 1643      PT LONG TERM GOAL #1   Title  Patient will improve her 10 MWT to 0.8 m/s or more with rw to promote better community ambulation.     Baseline  0.5 seconds with rw (09/06/2018); 0.63 seconds average with rw (10/18/2018)    Time  8    Period  Weeks    Status  On-going    Target Date  12/27/18      PT LONG TERM GOAL #2   Title  Patient will improve her DGI score using rw or least restrictive AD to 19/24 or more to promote balance.     Baseline  13/24 using rw (09/06/2018); 14/24 using rw (10/29/2018)    Time  8    Period  Weeks    Status  On-going    Target Date  12/27/18      PT LONG TERM GOAL #3   Title  Patient will improve bilateral LE strength by at least 1/2 MMT to promote ability to support herself when performing standing tasks and improve balance.     Time  8    Period  Weeks    Status  On-going      PT LONG TERM GOAL #4   Title  Patient will have a decrease in back pain/ache to 5/10 or less to promote ability to perform standing tasks with less difficulty.    Baseline  8/10 at most for the past 3 months (09/06/2018); 5-6/10 at worst for the past 7 days (10/18/2018)    Time  8    Period  Weeks    Status  Partially Met            Plan - 10/29/18 1408     Clinical Impression Statement  Pt demonstrates overall improved B LE strength, gait speed with her rw, and overall decreased back pain since initial evaluation. Similar DGI score in which decreased confidence in stepping over an obstacle such as a shoe box prevented her from scoring points. Pt able to step over a shoe box multiple times later in the session. Pt still demonstrates B LE weakness, back pain, decreased balance, and difficulty with gait and would benefit from continued skilled physical therapy services to address the aforementioned deficits.     History and Personal Factors relevant to plan of care:  Chronicity of conditon, back pain, weakness, multiple comorbidities, age    Clinical Presentation  Stable    Clinical Presentation due to:  Pt making some progress with PT towards goals     Clinical Decision Making  Low    Rehab Potential  Fair    Clinical Impairments Affecting Rehab Potential  (-) chronicity of condition, age, comorbidities; (+) motivated  PT Frequency  2x / week    PT Duration  8 weeks    PT Treatment/Interventions  Aquatic Therapy;Gait training;Therapeutic activities;Therapeutic exercise;Balance training;Neuromuscular re-education;Patient/family education;Manual techniques;Dry needling    PT Next Visit Plan  trunk, hip, scapular strengthening, balance, gait, manual techniques PRN    Consulted and Agree with Plan of Care  Patient       Patient will benefit from skilled therapeutic intervention in order to improve the following deficits and impairments:  Abnormal gait, Decreased activity tolerance, Decreased balance, Decreased endurance, Decreased range of motion, Decreased strength, Difficulty walking, Improper body mechanics, Postural dysfunction, Pain, Decreased knowledge of use of DME  Visit Diagnosis: Unsteadiness on feet - Plan: PT plan of care cert/re-cert  History of falling - Plan: PT plan of care cert/re-cert  Muscle weakness (generalized) - Plan: PT plan  of care cert/re-cert  Difficulty in walking, not elsewhere classified - Plan: PT plan of care cert/re-cert  Chronic low back pain, unspecified back pain laterality, unspecified whether sciatica present - Plan: PT plan of care cert/re-cert     Problem List Patient Active Problem List   Diagnosis Date Noted  . Septic olecranon bursitis of left elbow 03/27/2018  . Lymphedema 10/11/2017  . Chronic venous insufficiency 10/11/2017  . Leg pain 10/11/2017  . Leg swelling 10/11/2017  . COPD (chronic obstructive pulmonary disease) ([place]) 10/11/2017  . Essential hypertension 10/11/2017    Joneen Boers PT, DPT   10/29/2018, 5:03 PM  [facility] [address] [place], [address] Phone: [phone]   Fax:  [phone]  Name: [name] MRN: [id number] Date of Birth: 06-30-1930

## 2018-10-31 ENCOUNTER — Encounter: Payer: Medicare Other | Admitting: Internal Medicine

## 2018-10-31 DIAGNOSIS — T8189XA Other complications of procedures, not elsewhere classified, initial encounter: Secondary | ICD-10-CM | POA: Diagnosis not present

## 2018-11-01 ENCOUNTER — Ambulatory Visit: Payer: Medicare Other

## 2018-11-01 DIAGNOSIS — G8929 Other chronic pain: Secondary | ICD-10-CM

## 2018-11-01 DIAGNOSIS — R2681 Unsteadiness on feet: Secondary | ICD-10-CM | POA: Diagnosis not present

## 2018-11-01 DIAGNOSIS — R262 Difficulty in walking, not elsewhere classified: Secondary | ICD-10-CM

## 2018-11-01 DIAGNOSIS — M6281 Muscle weakness (generalized): Secondary | ICD-10-CM

## 2018-11-01 DIAGNOSIS — M545 Low back pain, unspecified: Secondary | ICD-10-CM

## 2018-11-01 DIAGNOSIS — Z9181 History of falling: Secondary | ICD-10-CM

## 2018-11-01 NOTE — Therapy (Signed)
[facility] [address], [place], [address] Phone: [phone]   Fax:  [phone]  Physical Therapy Treatment  Patient Details  Name: [name] MRN: [id number] Date of Birth: 07/28/1930 Referring Provider (PT): Jennings Books, MD   Encounter Date: 11/01/2018  PT End of Session - 11/01/18 1518    Visit Number  13    Number of Visits  33    Date for PT Re-Evaluation  12/27/18    Authorization Type  4    Authorization Time Period  of 10 progress report    PT Start Time  1518    PT Stop Time  1603    PT Time Calculation (min)  45 min    Equipment Utilized During Treatment  Gait belt    Activity Tolerance  Patient tolerated treatment well    Behavior During Therapy  WFL for tasks assessed/performed       Past Medical History:  Diagnosis Date  . Anemia   . GERD (gastroesophageal reflux disease)   . Hypertension   . Peripheral vascular disease (HCC)    possible neuropathies in lower extremeties  . PONV (postoperative nausea and vomiting)    happens sometimes but better with pre med of zofran  . Presence of permanent cardiac pacemaker   . Syncope     Past Surgical History:  Procedure Laterality Date  . ABDOMINAL HYSTERECTOMY  1969  . APPENDECTOMY  1969   with hysterectomy  . BACK SURGERY  2001   rods in back. surgery on back x 3  . COLONOSCOPY    . I&D EXTREMITY Left 03/27/2018   Procedure: IRRIGATION AND DEBRIDEMENT LEFT ELBOW / OLECRANON BURSA;  Surgeon: Corky Mull, MD;  Location: [facility];  Service: Orthopedics;  Laterality: Left;  . I&D EXTREMITY Left 06/05/2018   Procedure: IRRIGATION AND DEBRIDEMENT EXTREMITY;  Surgeon: Corky Mull, MD;  Location: [facility];  Service: Orthopedics;  Laterality: Left;  . INSERT / REPLACE / REMOVE PACEMAKER  2004  . JOINT REPLACEMENT Left 2004   partial knee replacement  . OLECRANON BURSECTOMY Left 03/27/2018   Procedure: LEFT OLECRANON BURSA;  Surgeon:  Corky Mull, MD;  Location: [facility];  Service: Orthopedics;  Laterality: Left;  . PACEMAKER INSERTION  2014   dual chamber for complete heart block    There were no vitals filed for this visit.  Subjective Assessment - 11/01/18 1520    Subjective  Walking is ok. Would like to walk without the walker. Going to see the plastic surgeon around December 2019 for [name].      Pertinent History  Imparied gait. Pt states that she has had balance issues for too long. Feels more comfortable walking with her rw. Had tests which showed that she has neuropathy. However the doctor at [facility] said that her balance is coming from her back. Just wants to be strong so she can walk.  Started walking with her rw off and on for the past several months.  Also feels like she might need a new sleep number bed. Also had 2 wound surgeries at [name]. Pt fell in February 2019. Had to stop PT due to the L elbow surgery April 06, 2018 to remove a bursa. The wound still has not healed.   Currently sees a wound doctor.   Feels numbness L foot from the ankle down.  No R foot numbness.  Sometimes unsure of where her  L LE is in space.  Pt also states that she tires too quickly.  Has not been able to walk her dog in 2 years. Has not really been doing exercises at home except the sit <> stand. Pt states that prolonged sitting without back support will increase back pain     Limitations  Standing;Walking    How long can you sit comfortably?  1 hr    How long can you stand comfortably?  10 minutes    How long can you walk comfortably?  2 or 3 blocks    Diagnostic tests  EMG testing BLEs and BUEs which were negative    Patient Stated Goals  Improve LE strength. Wants to use the machines at the gym. Walk more steadily. Be able to take her dog (16 lbs) out on a leash.     Currently in Pain?  No/denies    Pain Onset  More than a month ago                               PT Education - 11/01/18 1544     Education provided  Yes    Education Details  ther-ex    [employer]) Educated  Patient    Methods  Explanation;Demonstration;Tactile cues;Verbal cues    Comprehension  Returned demonstration;Verbalized understanding      Objectives  Pt ambulating with rw  TTP bilateral greater trochanter L >R   L elbow wound  No latex band allergies   [employer] Code: [email or web address]   Ther-ex  CGAto SBAwith standing activities.   Sit <> stand throughout session with emphasis on femoral control   Seated manually resisted trunk flexion isometrics in neutral 10x5 seconds for 3 sets  Gait with SPC 200 ft x 2 with CGA  Cues for increasing hip and knee flexion for better foot clearance as well as step length.     Four square step exercise 5x with SPC. Unsteady. Cues to bring [facility] with her instead of keeping it at one spot.    Standing alternating toe taps onto treadmill platform with light touch assist 10x3 each LE  Standing LE leg press resisting double red band at bottom rung of treadmill bar 10x3 each LE to promote glute strengthening.  Side stepping 5 ft to the L and 5 ft to the R with B UE assist 4x Side stepping on Air Ex Beam with B UE assist 10x to the L and 10x to the R   Stepping over a shoe box with R and L feet with one UE assist 5x each LE  Improved exercise technique, movement at target joints, use of target muscles after mod verbal, visual, tactile cues.    Pt demonstrates decreased balance confidence observed with tendency to shuffle her feet and hold onto objects as she is walking with SPC even though pt seems like she can support herself with the [facility]. Cues also needed to increase hip and knee flexion to promote foot clearance and cues for increased step length to promote a more normal gait pattern. Improving ability to maintain femoral control with sit <> stand while in the clinic. Pt also seems to be improving activity tolerance with pt able to perform  4 exercises in a row prior to needing a sitting rest break today. Pt will benefit from continued skilled physical therapy services to improve LE strength, balance, and improve ability to ambulate.  PT Short Term Goals - 10/18/18 1850      PT SHORT TERM GOAL #1   Title  Patient will be independent with her HEP to improve strength and balance.     Time  3    Period  Weeks    Status  On-going    Target Date  09/27/18        PT Long Term Goals - 10/29/18 1643      PT LONG TERM GOAL #1   Title  Patient will improve her 10 MWT to 0.8 m/s or more with rw to promote better community ambulation.     Baseline  0.5 seconds with rw (09/06/2018); 0.63 seconds average with rw (10/18/2018)    Time  8    Period  Weeks    Status  On-going    Target Date  12/27/18      PT LONG TERM GOAL #2   Title  Patient will improve her DGI score using rw or least restrictive AD to 19/24 or more to promote balance.     Baseline  13/24 using rw (09/06/2018); 14/24 using rw (10/29/2018)    Time  8    Period  Weeks    Status  On-going    Target Date  12/27/18      PT LONG TERM GOAL #3   Title  Patient will improve bilateral LE strength by at least 1/2 MMT to promote ability to support herself when performing standing tasks and improve balance.     Time  8    Period  Weeks    Status  On-going      PT LONG TERM GOAL #4   Title  Patient will have a decrease in back pain/ache to 5/10 or less to promote ability to perform standing tasks with less difficulty.    Baseline  8/10 at most for the past 3 months (09/06/2018); 5-6/10 at worst for the past 7 days (10/18/2018)    Time  8    Period  Weeks    Status  Partially Met            Plan - 11/01/18 1515    Clinical Impression Statement  Pt demonstrates decreased balance confidence observed with tendency to shuffle her feet and hold onto objects as she is walking with SPC even though pt seems like she can support herself with the [facility]. Cues also  needed to increase hip and knee flexion to promote foot clearance and cues for increased step length to promote a more normal gait pattern. Improving ability to maintain femoral control with sit <> stand while in the clinic. Pt also seems to be improving activity tolerance with pt able to perform 4 exercises in a row prior to needing a sitting rest break today. Pt will benefit from continued skilled physical therapy services to improve LE strength, balance, and improve ability to ambulate.      Rehab Potential  Fair    Clinical Impairments Affecting Rehab Potential  (-) chronicity of condition, age, comorbidities; (+) motivated    PT Frequency  2x / week    PT Duration  8 weeks    PT Treatment/Interventions  Aquatic Therapy;Gait training;Therapeutic activities;Therapeutic exercise;Balance training;Neuromuscular re-education;Patient/family education;Manual techniques;Dry needling    PT Next Visit Plan  trunk, hip, scapular strengthening, balance, gait, manual techniques PRN    Consulted and Agree with Plan of Care  Patient       Patient will benefit from skilled therapeutic intervention in order to  improve the following deficits and impairments:  Abnormal gait, Decreased activity tolerance, Decreased balance, Decreased endurance, Decreased range of motion, Decreased strength, Difficulty walking, Improper body mechanics, Postural dysfunction, Pain, Decreased knowledge of use of DME  Visit Diagnosis: Unsteadiness on feet  History of falling  Muscle weakness (generalized)  Difficulty in walking, not elsewhere classified  Chronic low back pain, unspecified back pain laterality, unspecified whether sciatica present     Problem List Patient Active Problem List   Diagnosis Date Noted  . Septic olecranon bursitis of left elbow 03/27/2018  . Lymphedema 10/11/2017  . Chronic venous insufficiency 10/11/2017  . Leg pain 10/11/2017  . Leg swelling 10/11/2017  . COPD (chronic obstructive  pulmonary disease) ([place]) 10/11/2017  . Essential hypertension 10/11/2017    Joneen Boers PT, DPT   11/01/2018, 4:20 PM  [facility] [address] [place], [address] Phone: [phone]   Fax:  [phone]  Name: MINTIE WITHERINGTON MRN: [id number] Date of Birth: [birth year]

## 2018-11-02 NOTE — Progress Notes (Signed)
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 surgery site. Obviously I do think this is something we can try to help her out with. 05/04/18 on evaluation today patient appears to be doing well in regard to her left anterior lower Trinity ulcers. She is making good progress which is great news. Unfortunately her elbow which we are also managing at this point in time has not made as much progress unfortunately. She has been tolerating the dressing changes without complication. She did see Dr. Darleen Crocker earlier today and he states that he's willing to give this three weeks to see if she's making any progress with wound care. However he states that she's really not then he will need to go back in and perform further surgery. Obviously she is trying to avoid surgery if at all possible although I'm not sure if this is going to be possible or least not that quickly. 05/11/18 on evaluation today patient appears to be doing very well in regard to her left lower extremity ulcers. Unfortunately in regard to her elbow this is very slow coming about as far as any improvement is concerned. I do feel like there may be a little bit more granulation noted in the base of the wound but nothing too significant  unfortunately. I still can probe bone in the proximal portion of the wound which obviously explain to the patient is not good. She will be having a follow-up with her orthopedic surgeon in the next couple of weeks. In the meantime we are trying to do as much as we can to try  to show signs of improvement in healing to avoid the need for any additional and further surgery. Nonetheless I explained to the patient yet again today I'm not sure if that is going to be feasible or not obviously it's more risk for her to continue to have an open wound with bone exposure then to the back in for additional surgery even though I know she doesn't want to go that route. 05/15/18 on evaluation today patient presents for follow-up concerning her ongoing lower extremity ulcers on the left as well as the left elbow ulcer. She has at this point in time been tolerating the dressing changes without complication. Her left lower extremity ulcer appears to be doing very well. In regard to the left elbow ulcer she actually does seem to have additional granulation today which is good news. I am definitely seeing signs of improvement although obviously this is somewhat slow improvement. Nonetheless I'm hopeful we will be able to avoid her having to have any further surgery but again that would definitely be a conversation between herself as well as her surgeon once he sees her for reevaluation. Otherwise she does want to see about having a three order compression stockings for her today 05/21/18 on evaluation today patient appears to be doing well in regard to her left lower surety ulcer. This is almost completely healed and seems to be progressing very nicely. With that being said her left elbow is another story. I'm not really convinced in the past three weeks we've seen a significant improvement in this wound. With that being said if this is something that there is no surgical option for him we have to continue to work on this  from the standpoint of conservative management with wound care she may make improvement given time. Nonetheless it appears that her surgeon is somewhat concerned about the possibility of infection and really is leaning towards additional surgery to try and help close this wound. Nonetheless the patient is still unsure of exactly what to do. 05/29/18 on evaluation today patient appears to be doing well in regard to her left lower extremity ulcer. She's been tolerating the dressing changes without complication which is good news. With that being said she's been having issues specifically with her elbow she did see her surgeon Dr. Joice Lofts and he is recommending a repeat surgery to the left elbow in order to correct the issue. The patient is still somewhat unsure of this but feels like this may be better than trying to take time to let this heal over a longer period of time through normal wound care measures. Again I explained that I agree this may be a faster way to go if her surgeon feels that this is indeed a good direction to take. Obviously only he can make the judgment on whether or not the surgery would likely be successful. 06/04/18 on evaluation today patient actually presents for follow-up concerning her left elbow and left lower from the ulcer she seems to be doing very well at this point in time. She has been tolerating the dressing changes without complication. With that being said her elbow is not significantly better she actually is scheduled for surgery tomorrow. 07/04/18; the patient had an area on her left leg that is remaining closed. The open area she has now is a postsurgical wound on the left elbow. I think we have clearance from the surgeon to see this now. [address] [place] V[place]land Kitchen ([id number]) 07/11/18; we're currently dealing with a  surgical wound on the left olecranon process. The patient complains of a lot of pain and drainage. When I saw her last week we did an x-ray  that showed soft tissue wound and probable elbow joint effusion but no erosion to suggest osteomyelitis. The culture I did of this was somewhat surprisingly negative. She has a small open wound with not a viable surface there is considerable undermining relative to the wound size. She is on methotrexate for rheumatoid arthritis/overlap syndrome also plaquenil. We've been using silver collagen 07/18/18-She is seen in follow-up evaluation for a left elbow wound. There is essentially no change. She is currently on Zithromax and will complete that on Friday, there is no indication to extend this. We will change to iodosorb/iodoflex and monitor for response 07/25/18-She is seen in follow-up evaluation for left elbow wound. The wound is stable with no overt evidence of infection. She has counseled with her rheumatologist. She is wanting to restart her methotrexate; a culture was obtained to rule out occult infection before starting her methotrexate. We will continue with Iodosorb/Iodoflex and she will follow-up next week. 08/01/18; this is a difficult wound over her left olecranon process. There is been concerned about infection although cultures including one done last week were negative. Pending 3 weeks ago I gave her an empiric course of antibiotics. She is having a lot of rheumatologic pain in her hands with pain and stiffness. She wants to go on her weekly methotrexate and I think it would be reasonable to do so. We have been using Iodoflex 08/01/18; difficult wound over her left olecranon process. She started back on methotrexate last week because of rheumatologic pain in her hands. We have been using Iodoflex to try and clean out the wound bed. She has been approved for Graphix PL 08/15/18; 2 week follow-up. Difficult wound over her left olecranon process. Graphix PL #1 with collagen backing 08/22/18; one-week follow-up. Difficult wound over her left olecranon process. Graphix PL #2 08/29/18; no major  improvement. Difficult wound over her left olecranon process. Still considerable undermining. Graphics PL #3 1 week follow-up. Graphix #4 09/12/18 graphics #5. Some improvement in wound area although the undermining superiorly still has not closed down as much as I would like 09/19/18; Graphix #6 I think there is improvement in the undermining from 7 to 9:00. Wound bed looks healthy. 09/26/18 Graffix #7 undermining is 0.5 cm maximally at roughly 8:00. From 12 to 7:00 the tissue is adherent which is a major improvement there is some advancing skin from this side. 10/03/18; Graphix #8 no major changes from last week 10/10/18 Graffix #9 There are improvements. There appears to be granulation coming up to the surface here and there is a lot less undermining at 8:00. 10/17/18. Graffix #10; Dimensions are improved less undermining surface felt the but the wound is still open. Initially a surgical wound following a bursectomy 10/24/18; Graffix #11. This is really stalled over the last 2 weeks. If there is no further improvement this will be the last application.The final option for this difficult area would be plastic surgery and will set up a consult with Dr. Marina Goodell in [facility] 10/31/18; wound looks about the same. The undermining superiorly is 0.7 cm. On the lateral edges perhaps some improvement there is no drainage. Objective Constitutional Sitting or standing Blood Pressure is within target range for patient.. Pulse regular and within target range for patient.Marland Kitchen Respirations regular, non-labored and within target range.. Temperature is normal and within the target range for the patient.Marland Kitchen  appears in no distress. Vitals Time Taken: 11:44 AM, Height: 60 in, Weight: 123 lbs, BMI: 24, Temperature: 97.9 F, Pulse: 64 bpm, Respiratory Rate: 18 breaths/min, Blood Pressure: 141/51 mmHg. FRANCES, AMBROSINO ([id number]) General Notes: When exam; the wound bed is satisfactory still 0.7 cm of undermining at  9:00. There is appears to be less undermining in other areas. What I can see in the wound bed looks healthy there is no evidence of any infection. Integumentary (Hair, Skin) Wound #9 status is Open. Original cause of wound was Trauma. The wound is located on the Left Elbow. The wound measures 0.5cm length x 0.5cm width x 0.2cm depth; 0.196cm^2 area and 0.039cm^3 volume. There is Fat Layer (Subcutaneous Tissue) Exposed exposed. There is no tunneling noted, however, there is undermining starting at 2:00 and ending at 7:00 with a maximum distance of 0.7cm. There is a medium amount of serous drainage noted. The wound margin is distinct with the outline attached to the wound base. There is no granulation within the wound bed. There is a medium (34- 66%) amount of necrotic tissue within the wound bed including Adherent Slough. The periwound skin appearance exhibited: Erythema. The periwound skin appearance did not exhibit: Callus, Crepitus, Excoriation, Induration, Rash, Scarring, Dry/Scaly, Maceration, Atrophie Blanche, Cyanosis, Ecchymosis, Hemosiderin Staining, Mottled, Pallor, Rubor. The surrounding wound skin color is noted with erythema. Periwound temperature was noted as No Abnormality. The periwound has tenderness on palpation. 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 Anesthetic (add to Medication List): Wound #9 Left Elbow: Topical Lidocaine 4% cream applied to wound bed prior to debridement (In Clinic Only). Skin Barriers/Peri-Wound Care: Wound #9 Left Elbow: Skin Prep Primary Wound Dressing: Wound #9 Left Elbow: Other: - Endoform Secondary Dressing: Wound #9 Left Elbow: Boardered Foam Dressing Dressing Change Frequency: Wound #9 Left Elbow: Other: - Change  dressing every three days Follow-up Appointments: Wound #9 Left Elbow: Return Appointment in 1 week. NORITA, MEIGS ([id number]) #1 changed her to Endoform this will be need to be changed every 2-3 days. The patient has a caregiver will help her with this. Her to still looking for her plastic surgery appointment. Apparently she was called Psychologist, prison and probation services) Signed: 10/31/2018 5:31:58 PM By: Baltazar Najjar MD Entered By: Baltazar Najjar on 10/31/2018 13:26:37 [name] ([id number]) -------------------------------------------------------------------------------- SuperBill Details Patient Name: [name] Date of Service: 10/31/2018 Medical Record Number: [id number] Patient Account Number: [id number] Date of Birth/Sex: 1930-10-30 (84 y.o. 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

## 2018-11-02 NOTE — Progress Notes (Signed)
PAULLETTE, MCKAIN ([id number]) Visit Report for 10/31/2018 Arrival Information Details Patient Name: RAYNIE, STEINHAUS Date of Service: 10/31/2018 11:15 AM Medical Record Number: [id number] Patient Account Number: [id number] Date of Birth/Sex: 04/02/1930 (84 y.o. F) Treating RN: Curtis Sites Primary Care Kyson Kupper: Aram Beecham Other Clinician: Referring Bilal Manzer: Aram Beecham Treating Alphonsine Minium/Extender: Altamese [place] in Treatment: 50 Visit Information History Since Last Visit Added or deleted any medications: No Patient Arrived: [place] Any new allergies or adverse reactions: No Arrival Time: 11:43 Had a fall or experienced change in No Accompanied By: self activities of daily living that may affect Transfer Assistance: None risk of falls: Patient Identification Verified: Yes Signs or symptoms of abuse/neglect since last visito No Secondary Verification Process Yes Hospitalized since last visit: No Completed: Implantable device outside of the clinic excluding No Patient Requires Transmission-Based No cellular tissue based products placed in the center Precautions: since last visit: Patient Has Alerts: Yes Has Dressing in Place as Prescribed: Yes Patient Alerts: non compressible left Pain Present Now: No leg Electronic Signature(s) Signed: 10/31/2018 3:28:51 PM By: Curtis Sites Entered By: Curtis Sites on 10/31/2018 11:43:56 [name] ([id number]) -------------------------------------------------------------------------------- Clinic Level of Care Assessment Details Patient Name: [name] Date of Service: 10/31/2018 11:15 AM Medical Record Number: [id number] Patient Account Number: [id number] Date of Birth/Sex: [birth year] (84 y.o. F) Treating RN: Huel Coventry Primary Care Doyce Stonehouse: Aram Beecham Other Clinician: Referring Andromeda Poppen: Aram Beecham Treating Ranya Fiddler/Extender: Altamese [place] in Treatment: 50 Clinic Level of Care  Assessment Items TOOL 4 Quantity Score []  - Use when only an EandM is performed on FOLLOW-UP visit 0 ASSESSMENTS - Nursing Assessment / Reassessment X - Reassessment of Co-morbidities (includes updates in patient status) 1 10 X- 1 5 Reassessment of Adherence to Treatment Plan ASSESSMENTS - Wound and Skin Assessment / Reassessment X - Simple Wound Assessment / Reassessment - one wound 1 5 []  - 0 Complex Wound Assessment / Reassessment - multiple wounds []  - 0 Dermatologic / Skin Assessment (not related to wound area) ASSESSMENTS - Focused Assessment []  - Circumferential Edema Measurements - multi extremities 0 []  - 0 Nutritional Assessment / Counseling / Intervention []  - 0 Lower Extremity Assessment (monofilament, tuning fork, pulses) []  - 0 Peripheral Arterial Disease Assessment (using hand held doppler) ASSESSMENTS - Ostomy and/or Continence Assessment and Care []  - Incontinence Assessment and Management 0 []  - 0 Ostomy Care Assessment and Management (repouching, etc.) PROCESS - Coordination of Care X - Simple Patient / Family Education for ongoing care 1 15 []  - 0 Complex (extensive) Patient / Family Education for ongoing care X- 1 10 Staff obtains Chiropractor, Records, Test Results / Process Orders []  - 0 Staff telephones HHA, Nursing Homes / Clarify orders / etc []  - 0 Routine Transfer to another Facility (non-emergent condition) []  - 0 Routine Hospital Admission (non-emergent condition) []  - 0 New Admissions / Manufacturing engineer / Ordering NPWT, Apligraf, etc. []  - 0 Emergency Hospital Admission (emergent condition) X- 1 10 Simple Discharge Coordination ALVERIA, MCGLAUGHLIN ([id number]) []  - 0 Complex (extensive) Discharge Coordination PROCESS - Special Needs []  - Pediatric / Minor Patient Management 0 []  - 0 Isolation Patient Management []  - 0 Hearing / Language / Visual special needs []  - 0 Assessment of Community assistance (transportation, D/C planning,  etc.) []  - 0 Additional assistance / Altered mentation []  - 0 Support Surface(s) Assessment (bed, cushion, seat, etc.) INTERVENTIONS - Wound Cleansing / Measurement X - Simple Wound Cleansing - one wound 1  5 []  - 0 Complex Wound Cleansing - multiple wounds []  - 0 Wound Imaging (photographs - any number of wounds) []  - 0 Wound Tracing (instead of photographs) X- 1 5 Simple Wound Measurement - one wound []  - 0 Complex Wound Measurement - multiple wounds INTERVENTIONS - Wound Dressings X - Small Wound Dressing one or multiple wounds 1 10 []  - 0 Medium Wound Dressing one or multiple wounds []  - 0 Large Wound Dressing one or multiple wounds []  - 0 Application of Medications - topical []  - 0 Application of Medications - injection INTERVENTIONS - Miscellaneous []  - External ear exam 0 []  - 0 Specimen Collection (cultures, biopsies, blood, body fluids, etc.) []  - 0 Specimen(s) / Culture(s) sent or taken to Lab for analysis []  - 0 Patient Transfer (multiple staff / / Similar devices) []  - 0 Simple Staple / Suture removal (25 or less) []  - 0 Complex Staple / Suture removal (26 or more) []  - 0 Hypo / Hyperglycemic Management (close monitor of Blood Glucose) []  - 0 Ankle / Brachial Index (ABI) - do not check if billed separately X- 1 5 Vital Signs SELINA, TAPPER ( ) Has the patient been seen at the hospital within the last three years: Yes Total Score: 80 Level Of Care: New/Established - Level 3 Electronic Signature(s) Signed: 10/31/2018 5:21:06 PM By: , BSN, RN, CWS, Kim RN, BSN Entered By: , BSN, RN, CWS, Kim on 10/31/2018 12:40:26 ( ) -------------------------------------------------------------------------------- Lower Extremity Assessment Details Patient Name: Date of Service: 10/31/2018 11:15 AM Medical Record Number: Patient Account Number: Nurse, adult Date of Birth/Sex: Sep 11, 1930  (84 y.o. F) Treating RN: Primary Care Talonda Artist: Other Clinician: Referring Shyonna Carlin: [name] Treating Tanis Burnley/Extender: [id number] in Treatment: 50 Electronic Signature(s) Signed: 10/31/2018 3:28:51 PM By: Elliot Gurney Entered By: Elliot Gurney on 10/31/2018 11:55:01 [name] ([id number]) -------------------------------------------------------------------------------- Multi Wound Chart Details Patient Name: [name] Date of Service: 10/31/2018 11:15 AM Medical Record Number: [id number] Patient Account Number: [id number] Date of Birth/Sex: 14-Feb-1930 (84 y.o. F) Treating RN: Curtis Sites Primary Care Jayr Lupercio: Aram Beecham Other Clinician: Referring Haydee Jabbour: Aram Beecham Treating Coraima Tibbs/Extender: Altamese [place] in Treatment: 50 Vital Signs Height(in): 60 Pulse(bpm): 64 Weight(lbs): 123 Blood Pressure(mmHg): 141/51 Body Mass Index(BMI): 24 Temperature(F): 97.9 Respiratory Rate 18 (breaths/min): Photos: [9:No Photos] [N/A:N/A] Wound Location: [9:Left Elbow] [N/A:N/A] Wounding Event: [9:Trauma] [N/A:N/A] Primary Etiology: [9:Trauma, Other] [N/A:N/A] Comorbid History: [9:Cataracts, Glaucoma, Optic Neuritis, Chronic sinus problems/congestion, Middle ear problems] [N/A:N/A] Date Acquired: [9:01/26/2018] [N/A:N/A] Weeks of Treatment: [9:26] [N/A:N/A] Wound Status: [9:Open] [N/A:N/A] Measurements L x W x D [9:0.5x0.5x0.2] [N/A:N/A] (cm) Area (cm) : [9:0.196] [N/A:N/A] Volume (cm) : [9:0.039] [N/A:N/A] % Reduction in Area: [9:-176.10%] [N/A:N/A] % Reduction in Volume: [9:-178.60%] [N/A:N/A] Starting Position 1 [9:2] (o'clock): Ending Position 1 [9:7] (o'clock): Maximum Distance 1 (cm): [9:0.7] Undermining: [9:Yes] [N/A:N/A] Classification: [9:Full Thickness With Exposed Support Structures] [N/A:N/A] Exudate Amount: [9:Medium] [N/A:N/A] Exudate Type: [9:Serous] [N/A:N/A] Exudate Color:  [9:amber] [N/A:N/A] Wound Margin: [9:Distinct, outline attached] [N/A:N/A] Granulation Amount: [9:None Present (0%)] [N/A:N/A] Necrotic Amount: [9:Medium (34-66%)] [N/A:N/A] Exposed Structures: [9:Fat Layer (Subcutaneous Tissue) Exposed: Yes Fascia: No Tendon: No Muscle: No] [N/A:N/A] Joint: No Bone: No Epithelialization: None N/A N/A Periwound Skin Texture: Excoriation: No N/A N/A Induration: No Callus: No Crepitus: No Rash: No Scarring: No Periwound Skin Moisture: Maceration: No N/A N/A Dry/Scaly: No Periwound Skin Color: Erythema: Yes N/A N/A Atrophie Blanche: No Cyanosis: No Ecchymosis: No  Hemosiderin Staining: No Mottled: No Pallor: No Rubor: No Temperature: No Abnormality N/A N/A Tenderness on Palpation: Yes N/A N/A Wound Preparation: Ulcer Cleansing: N/A N/A Rinsed/Irrigated with Saline Topical Anesthetic Applied: Other: lidocaine 4% Treatment Notes Electronic Signature(s) Signed: 10/31/2018 5:21:06 PM By: Elliot Gurney, BSN, RN, CWS, Kim RN, BSN Entered By: Elliot Gurney, BSN, RN, CWS, Kim on 10/31/2018 12:33:01 [name] ([id number]) -------------------------------------------------------------------------------- Multi-Disciplinary Care Plan Details Patient Name: ZANAIYAH, PODESTA Date of Service: 10/31/2018 11:15 AM Medical Record Number: [id number] Patient Account Number: [id number] Date of Birth/Sex: 30-Jan-1930 (84 y.o. F) Treating RN: Huel Coventry Primary Care Louie Flenner: Aram Beecham Other Clinician: Referring Wash Nienhaus: Aram Beecham Treating Omkar Stratmann/Extender: Altamese [place] in Treatment: 72 Active Inactive ` Soft Tissue Infection Nursing Diagnoses: Impaired tissue integrity Goals: Patient's soft tissue infection will resolve Date Initiated: 07/18/2018 Target Resolution Date: 07/27/2018 Goal Status: Active Interventions: Assess signs and symptoms of infection every visit Treatment Activities: Culture and sensitivity : 07/18/2018 Systemic  antibiotics : 07/18/2018 Notes: ` Wound/Skin Impairment Nursing Diagnoses: Impaired tissue integrity Knowledge deficit related to ulceration/compromised skin integrity Goals: Patient/caregiver will verbalize understanding of skin care regimen Date Initiated: 11/14/2017 Target Resolution Date: 11/28/2017 Goal Status: Active Ulcer/skin breakdown will have a volume reduction of 30% by week 4 Date Initiated: 11/14/2017 Target Resolution Date: 11/28/2017 Goal Status: Active Interventions: Assess patient/caregiver ability to obtain necessary supplies Assess patient/caregiver ability to perform ulcer/skin care regimen upon admission and as needed Assess ulceration(s) every visit Treatment Activities: Skin care regimen initiated : 11/14/2017 NEFRETIRI, OLIVERIA ([id number]) Notes: Electronic Signature(s) Signed: 10/31/2018 5:21:06 PM By: Elliot Gurney, BSN, RN, CWS, Kim RN, BSN Entered By: Elliot Gurney, BSN, RN, CWS, Kim on 10/31/2018 12:32:53 [name] ([id number]) -------------------------------------------------------------------------------- Pain Assessment Details Patient Name: [name] Date of Service: 10/31/2018 11:15 AM Medical Record Number: [id number] Patient Account Number: [id number] Date of Birth/Sex: 1930/05/03 (84 y.o. F) Treating RN: Curtis Sites Primary Care Keyron Pokorski: Aram Beecham Other Clinician: Referring Ashdon Gillson: Aram Beecham Treating Ashlon Lottman/Extender: Altamese [place] in Treatment: 50 Active Problems Location of Pain Severity and Description of Pain Patient Has Paino No Site Locations Pain Management and Medication Current Pain Management: Electronic Signature(s) Signed: 10/31/2018 3:28:51 PM By: Curtis Sites Entered By: Curtis Sites on 10/31/2018 11:44:04 [name] ([id number]) -------------------------------------------------------------------------------- Patient/Caregiver Education Details Patient Name: [name] Date of  Service: 10/31/2018 11:15 AM Medical Record Number: [id number] Patient Account Number: [id number] Date of Birth/Gender: 1930/06/23 (84 y.o. F) Treating RN: Huel Coventry Primary Care Physician: Aram Beecham Other Clinician: Referring Physician: Aram Beecham Treating Physician/Extender: Altamese [place] in Treatment: 35 Education Assessment Education Provided To: Patient Education Topics Provided Wound/Skin Impairment: Handouts: Caring for Your Ulcer Methods: Demonstration, Explain/Verbal Responses: State content correctly Electronic Signature(s) Signed: 10/31/2018 5:21:06 PM By: Elliot Gurney, BSN, RN, CWS, Kim RN, BSN Entered By: Elliot Gurney, BSN, RN, CWS, Kim on 10/31/2018 12:40:51 [name] ([id number]) -------------------------------------------------------------------------------- Wound Assessment Details Patient Name: [name] Date of Service: 10/31/2018 11:15 AM Medical Record Number: [id number] Patient Account Number: [id number] Date of Birth/Sex: 10/20/1930 (84 y.o. F) Treating RN: Curtis Sites Primary Care Akeem Heppler: Aram Beecham Other Clinician: Referring Alira Fretwell: Aram Beecham Treating Raylin Diguglielmo/Extender: Altamese [place] in Treatment: 50 Wound Status Wound Number: [id number] Primary Trauma, Other Etiology: Wound Location: Left Elbow Wound Open Wounding Event: Trauma Status: Date Acquired: 01/26/2018 Comorbid Cataracts, Glaucoma, Optic Neuritis, Chronic Weeks Of Treatment: 26 History: sinus problems/congestion, Middle ear problems Clustered Wound: No Photos Photo Uploaded By: Curtis Sites on 10/31/2018 13:03:41 Wound  Measurements Length: (cm) 0.5 Width: (cm) 0.5 Depth: (cm) 0.2 Area: (cm) 0.196 Volume: (cm) 0.039 % Reduction in Area: -176.1% % Reduction in Volume: -178.6% Epithelialization: None Tunneling: No Undermining: Yes Starting Position (o'clock): 2 Ending Position (o'clock): 7 Maximum Distance: (cm) 0.7 Wound  Description Full Thickness With Exposed Support Classification: Structures Wound Margin: Distinct, outline attached Exudate Medium Amount: Exudate Type: Serous Exudate Color: amber Foul Odor After Cleansing: No Slough/Fibrino No Wound Bed Granulation Amount: None Present (0%) Exposed Structure Necrotic Amount: Medium (34-66%) Fascia Exposed: No Necrotic Quality: Adherent Slough Fat Layer (Subcutaneous Tissue) Exposed: Yes ANIYLAH, AVANS ([id number]) Tendon Exposed: No Muscle Exposed: No Joint Exposed: No Bone Exposed: No Periwound Skin Texture Texture Color No Abnormalities Noted: No No Abnormalities Noted: No Callus: No Atrophie Blanche: No Crepitus: No Cyanosis: No Excoriation: No Ecchymosis: No Induration: No Erythema: Yes Rash: No Hemosiderin Staining: No Scarring: No Mottled: No Pallor: No Moisture Rubor: No No Abnormalities Noted: No Dry / Scaly: No Temperature / Pain Maceration: No Temperature: No Abnormality Tenderness on Palpation: Yes Wound Preparation Ulcer Cleansing: Rinsed/Irrigated with Saline Topical Anesthetic Applied: Other: lidocaine 4%, Electronic Signature(s) Signed: 10/31/2018 3:28:51 PM By: Curtis Sites Entered By: Curtis Sites on 10/31/2018 11:54:00 [name] ([id number]) -------------------------------------------------------------------------------- Vitals Details Patient Name: [name] Date of Service: 10/31/2018 11:15 AM Medical Record Number: [id number] Patient Account Number: [id number] Date of Birth/Sex: [birth year] (84 y.o. F) Treating RN: Curtis Sites Primary Care Mailen Newborn: Aram Beecham Other Clinician: Referring Christell Steinmiller: Aram Beecham Treating Nour Scalise/Extender: Altamese [place] in Treatment: 50 Vital Signs Time Taken: 11:44 Temperature (F): 97.9 Height (in): 60 Pulse (bpm): 64 Weight (lbs): 123 Respiratory Rate (breaths/min): 18 Body Mass Index (BMI): 24 Blood Pressure (mmHg):  141/51 Reference Range: 80 - 120 mg / dl Electronic Signature(s) Signed: 10/31/2018 3:28:51 PM By: Curtis Sites Entered By: Curtis Sites on 10/31/2018 11:48:17

## 2018-11-02 NOTE — Progress Notes (Signed)
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 her trouble. No fevers, chills, nausea, or vomiting noted at this time. Otherwise the wound does appear to be doing well. 01/30/18 on evaluation today patient appears to be doing well in regard to her right anterior lower extremity ulcer. She notes that this does seem to be smaller and she wonders why we did not start the Prisma dressing sooner since it has made such a big difference in such a short amount of time. I explained that obviously we have to wait for the wound to get to a certain point along his healing path before we can initiate the Prisma otherwise it will not be effective. Therefore once the wound became [name], [name] ([id number]) clean it was then time to initiate the Prisma. Nonetheless good news is she is noting excellent improvement she does still have some discomfort but nothing as significant as previously noted. 04/17/18 on evaluation today patient appears to be doing very well and in fact her right lower extremity ulcer has completely healed at this point I'm pleased with this. The left lower extremity ulcer seem to be doing better although she still does have some openings noted the Prisma I think is helping more than the  Xeroform was in my pinion. With that being said she still has a lot of healing to do in this regard. 04/27/18 on evaluation today patient appears to be doing very well in regard to her left lower Trinity ulcers. She has been tolerating the dressing changes without complication. I do have a note from her orthopedic surgeon today and they would like for me to help with treating her left elbow surgery site where she had the bursa removed and this was performed roughly 4 weeks ago according to the note that I reviewed. She has been placed on Bactrim DS by need for her leg wounds this probably helped a little bit with the left elbow surgery site. Obviously I do think this is something we can  try to help her out with. 05/04/18 on evaluation today patient appears to be doing well in regard to her left anterior lower Trinity ulcers. She is making good progress which is great news. Unfortunately her elbow which we are also managing at this point in time has not made as much progress unfortunately. She has been tolerating the dressing changes without complication. She did see Dr. Darleen Crocker earlier today and he states that he's willing to give this three weeks to see if she's making any progress with wound care. However he states that she's really not then he will need to go back in and perform further surgery. Obviously she is trying to avoid surgery if at all possible although I'm not sure if this is going to be possible or least not that quickly. 05/11/18 on evaluation today patient appears to be doing very well in regard to her left lower extremity ulcers. Unfortunately in regard to her elbow this is very slow coming about as far as any improvement is concerned. I do feel like there may be a little bit more granulation noted in the base of the wound but nothing too significant unfortunately. I still can probe bone in the proximal portion of the wound which obviously explain to the patient is not good. She will be  having a follow-up with her orthopedic surgeon in the next couple of weeks. In the meantime we are trying to do as much as we can to try to show signs of improvement in healing to avoid the need for any additional and further surgery. Nonetheless I explained to the patient yet again today I'm not sure if that is going to be feasible or not obviously it's more risk for her to continue to have an open wound with bone exposure then to the back in for additional surgery even though I know she doesn't want to go that route. 05/15/18 on evaluation today patient presents for follow-up concerning her ongoing lower extremity ulcers on the left as well as the left elbow ulcer. She has at this point in time been tolerating the dressing changes without complication. Her left lower extremity ulcer appears to be doing very well. In regard to the left elbow ulcer she actually does seem to have additional granulation today which is good news. I am definitely seeing signs of improvement although obviously this is somewhat slow improvement. Nonetheless I'm hopeful we will be able to avoid her having to have any further surgery but again that would definitely be a conversation between herself as well as her surgeon once he sees her for reevaluation. Otherwise she does want to see about having a three order compression stockings for her today 05/21/18 on evaluation today patient appears to be doing well in regard to her left lower surety ulcer. This is almost completely healed and seems to be progressing very nicely. With that being said her left elbow is another story. I'm not really convinced in the past three weeks we've seen a significant improvement in this wound. With that being said if this is something that there is no surgical option for him we have to continue to work on this from the standpoint of conservative management with wound care she may make improvement given time. Nonetheless it appears that her surgeon  is somewhat concerned about the possibility of infection and really is leaning towards additional surgery to try and help close this wound. Nonetheless the patient is still unsure of exactly what to do. 05/29/18 on evaluation  today patient appears to be doing well in regard to her left lower extremity ulcer. She's been tolerating the dressing changes without complication which is good news. With that being said she's been having issues specifically with her elbow she did see her surgeon Dr. Joice Lofts and he is recommending a repeat surgery to the left elbow in order to correct the issue. The patient is still somewhat unsure of this but feels like this may be better than trying to take time to let this heal over a longer period of time through normal wound care measures. Again I explained that I agree this may be a faster way to go if her surgeon feels that this is indeed a good direction to take. Obviously only he can make the judgment on whether or not the surgery would likely be successful. 06/04/18 on evaluation today patient actually presents for follow-up concerning her left elbow and left lower from the ulcer she seems to be doing very well at this point in time. She has been tolerating the dressing changes without complication. With that being said her elbow is not significantly better she actually is scheduled for surgery tomorrow. 07/04/18; the patient had an area on her left leg that is remaining closed. The open area she has now is a postsurgical wound on the left elbow. I think we have clearance from the surgeon to see this now. [address], [place] V[place]land Kitchen ([id number]) 07/11/18; we're currently dealing with a surgical wound on the left olecranon process. The patient complains of a lot of pain and drainage. When I saw her last week we did an x-ray that showed soft tissue wound and probable elbow joint effusion but no erosion to suggest osteomyelitis. The culture I did of this was somewhat  surprisingly negative. She has a small open wound with not a viable surface there is considerable undermining relative to the wound size. She is on methotrexate for rheumatoid arthritis/overlap syndrome also plaquenil. We've been using silver collagen 07/18/18-She is seen in follow-up evaluation for a left elbow wound. There is essentially no change. She is currently on Zithromax and will complete that on Friday, there is no indication to extend this. We will change to iodosorb/iodoflex and monitor for response 07/25/18-She is seen in follow-up evaluation for left elbow wound. The wound is stable with no overt evidence of infection. She has counseled with her rheumatologist. She is wanting to restart her methotrexate; a culture was obtained to rule out occult infection before starting her methotrexate. We will continue with Iodosorb/Iodoflex and she will follow-up next week. 08/01/18; this is a difficult wound over her left olecranon process. There is been concerned about infection although cultures including one done last week were negative. Pending 3 weeks ago I gave her an empiric course of antibiotics. She is having a lot of rheumatologic pain in her hands with pain and stiffness. She wants to go on her weekly methotrexate and I think it would be reasonable to do so. We have been using Iodoflex 08/01/18; difficult wound over her left olecranon process. She started back on methotrexate last week because of rheumatologic pain in her hands. We have been using Iodoflex to try and clean out the wound bed. She has been approved for Graphix PL 08/15/18; 2 week follow-up. Difficult wound over her left olecranon process. Graphix PL #1 with collagen backing 08/22/18; one-week follow-up. Difficult wound over her left olecranon process. Graphix PL #2 08/29/18; no major improvement. Difficult wound over her left olecranon process.  Still considerable undermining. Graphics PL #3 1 week follow-up. Graphix #4 09/12/18  graphics #5. Some improvement in wound area although the undermining superiorly still has not closed down as much as I would like 09/19/18; Graphix #6 I think there is improvement in the undermining from 7 to 9:00. Wound bed looks healthy. 09/26/18 Graffix #7 undermining is 0.5 cm maximally at roughly 8:00. From 12 to 7:00 the tissue is adherent which is a major improvement there is some advancing skin from this side. 10/03/18; Graphix #8 no major changes from last week 10/10/18 Graffix #9 There are improvements. There appears to be granulation coming up to the surface here and there is a lot less undermining at 8:00. 10/17/18. Graffix #10; Dimensions are improved less undermining surface felt the but the wound is still open. Initially a surgical wound following a bursectomy 10/24/18; Graffix #11. This is really stalled over the last 2 weeks. If there is no further improvement this will be the last application.The final option for this difficult area would be plastic surgery and will set up a consult with Dr. Marina Goodell in [place] Objective Constitutional Sitting or standing Blood Pressure is within target range for patient.. Pulse regular and within target range for patient.Marland Kitchen Respirations regular, non-labored and within target range.. Temperature is normal and within the target range for the patient.Marland Kitchen appears in no distress. Vitals Time Taken: 11:27 AM, Height: 60 in, Weight: 123 lbs, BMI: 24, Temperature: 98.1 F, Pulse: 67 bpm, Respiratory Rate: 16 breaths/min, Blood Pressure: 113/74 mmHg. General Notes: Exam; the wound bed is satisfactory although we continue to have roughly 0.7 cm of undermining from 6 to 12:00. What I can see of the base of this looks healthy but I have been unable to get any more progression of granulation. There is no surrounding infection. Having said I applied graffix PL 1 further application. There is no further change this will be [name], [name] ([id number]) the  last application. Integumentary (Hair, Skin) Wound #9 status is Open. Original cause of wound was Trauma. The wound is located on the Left Elbow. The wound measures 0.5cm length x 0.5cm width x 0.2cm depth; 0.196cm^2 area and 0.039cm^3 volume. There is Fat Layer (Subcutaneous Tissue) Exposed exposed. There is no tunneling noted, however, there is undermining starting at 3:00 and ending at 12:00 with a maximum distance of 0.7cm. There is a medium amount of serous drainage noted. The wound margin is distinct with the outline attached to the wound base. There is no granulation within the wound bed. There is a medium (34- 66%) amount of necrotic tissue within the wound bed including Adherent Slough. The periwound skin appearance exhibited: Erythema. The periwound skin appearance did not exhibit: Callus, Crepitus, Excoriation, Induration, Rash, Scarring, Dry/Scaly, Maceration, Atrophie Blanche, Cyanosis, Ecchymosis, Hemosiderin Staining, Mottled, Pallor, Rubor. The surrounding wound skin color is noted with erythema. Periwound temperature was noted as No Abnormality. The periwound has tenderness on palpation. 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 A skin graft procedure using a bioengineered skin substitute/cellular or tissue based product was performed by Maxwell Caul, MD with the following instrument(s): Forceps and Scissors. Other was  applied and secured with Steri-Strips. 3 sq cm of product was utilized and 0 sq cm was wasted. Post Application, mepitel one was  applied. A Time Out was conducted at 11:46, prior to the start of the procedure. The procedure was tolerated well with a pain level of 0 throughout and a pain level of 0 following the procedure. Post procedure Diagnosis Wound #9: Same as Pre-Procedure . Plan Wound Cleansing: Wound #9 Left Elbow: Clean wound with Normal Saline. Anesthetic (add to Medication List): Wound #9 Left Elbow: [name], [name] ([id number]) Topical Lidocaine 4% cream applied to wound bed prior to debridement (In Clinic Only). Skin Barriers/Peri-Wound Care: Wound #9 Left Elbow: Skin Prep Secondary Dressing: Wound #9 Left Elbow: Boardered Foam Dressing Drawtex Dressing Change Frequency: Wound #9 Left Elbow: Change dressing every week - may change outer dressing if it gets soiled or wet Follow-up Appointments: Wound #9 Left Elbow: Return Appointment in 1 week. Advanced Therapies: Wound #9 Left Elbow: Grafix PL application in clinic; including contact layer, fixation with steri strips, dry gauze and cover dressing. Consults ordered were: Plastic Surgery #1 grafix PL#11 #2 I'm going to arrange for plastic surgery consult with Dr. Leta Baptist in [place] #3 if this is not improved in terms of the undermining next week, this will be the last application Electronic Signature(s) Signed: 10/24/2018 5:24:48 PM By: Elliot Gurney, BSN, RN, CWS, Kim RN, BSN Signed: 10/31/2018 5:31:58 PM By: Baltazar Najjar MD Previous Signature: 10/24/2018 5:24:02 PM Version By: Baltazar Najjar MD Entered By: Elliot Gurney, BSN, RN, CWS, Kim on 10/24/2018 17:24:48 [name] ([id number]) -------------------------------------------------------------------------------- SuperBill Details Patient Name: [name] Date of Service: 10/24/2018 Medical Record Number: [id number] Patient Account Number: [id number] Date of Birth/Sex: 1930/05/22 (84 y.o. 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 Facility Procedures CPT4: Description Modifier Quantity Code [id number] 15271 - SKIN SUB GRAFT TRNK/ARM/LEG 1 ICD-10 Diagnosis Description S51.002S Unspecified open wound of left elbow, sequela L98.495 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  without evidence of necrosis CPT4: 32440102 Q4133- Grafix PL Prime 1.5 x 2 cm 3 Physician Procedures CPT4: Description Modifier Quantity Code [id number] 15271 - WC PHYS SKIN SUB GRAFT TRNK/ARM/LEG 1 ICD-10 Diagnosis Description S51.002S Unspecified open wound of left elbow, sequela L98.495 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  involvement without evidence of necrosis Electronic Signature(s) Signed: 10/24/2018 5:24:02 PM By: Baltazar Najjar MD Entered By: Baltazar Najjar on 10/24/2018 12:18:34

## 2018-11-05 ENCOUNTER — Ambulatory Visit: Payer: Medicare Other

## 2018-11-05 DIAGNOSIS — M6281 Muscle weakness (generalized): Secondary | ICD-10-CM

## 2018-11-05 DIAGNOSIS — R2681 Unsteadiness on feet: Secondary | ICD-10-CM

## 2018-11-05 DIAGNOSIS — Z9181 History of falling: Secondary | ICD-10-CM

## 2018-11-05 DIAGNOSIS — M545 Low back pain: Secondary | ICD-10-CM

## 2018-11-05 DIAGNOSIS — R262 Difficulty in walking, not elsewhere classified: Secondary | ICD-10-CM

## 2018-11-05 DIAGNOSIS — G8929 Other chronic pain: Secondary | ICD-10-CM

## 2018-11-05 NOTE — Therapy (Signed)
[facility] [address], [place], [address] Phone: [phone]   Fax:  [phone]  Physical Therapy Treatment  Patient Details  Name: [name] MRN: [id number] Date of Birth: May 30, 1930 Referring Provider (PT): Jennings Books, MD   Encounter Date: 11/05/2018  PT End of Session - 11/05/18 1517    Visit Number  14    Number of Visits  33    Date for PT Re-Evaluation  12/27/18    Authorization Type  5    Authorization Time Period  of 10 progress report    PT Start Time  1518    PT Stop Time  1604    PT Time Calculation (min)  46 min    Equipment Utilized During Treatment  Gait belt    Activity Tolerance  Patient tolerated treatment well    Behavior During Therapy  WFL for tasks assessed/performed       Past Medical History:  Diagnosis Date  . Anemia   . GERD (gastroesophageal reflux disease)   . Hypertension   . Peripheral vascular disease (HCC)    possible neuropathies in lower extremeties  . PONV (postoperative nausea and vomiting)    happens sometimes but better with pre med of zofran  . Presence of permanent cardiac pacemaker   . Syncope     Past Surgical History:  Procedure Laterality Date  . ABDOMINAL HYSTERECTOMY  1969  . APPENDECTOMY  1969   with hysterectomy  . BACK SURGERY  2001   rods in back. surgery on back x 3  . COLONOSCOPY    . I&D EXTREMITY Left 03/27/2018   Procedure: IRRIGATION AND DEBRIDEMENT LEFT ELBOW / OLECRANON BURSA;  Surgeon: Corky Mull, MD;  Location: [facility];  Service: Orthopedics;  Laterality: Left;  . I&D EXTREMITY Left 06/05/2018   Procedure: IRRIGATION AND DEBRIDEMENT EXTREMITY;  Surgeon: Corky Mull, MD;  Location: [facility];  Service: Orthopedics;  Laterality: Left;  . INSERT / REPLACE / REMOVE PACEMAKER  2004  . JOINT REPLACEMENT Left 2004   partial knee replacement  . OLECRANON BURSECTOMY Left 03/27/2018   Procedure: LEFT OLECRANON BURSA;  Surgeon:  Corky Mull, MD;  Location: [facility];  Service: Orthopedics;  Laterality: Left;  . PACEMAKER INSERTION  2014   dual chamber for complete heart block    There were no vitals filed for this visit.  Subjective Assessment - 11/05/18 1519    Subjective  Pt states doing ok. No complain of pain. Pt states arms are sore. December 11 is her consultation appt with her plastic surgeon in [place] for [name].  Feels like her pace pushing her grocery cart is not as good compared to about 6 weeks ago.  Pt also states feeling not as balanced when she has to let go of the grocery cart to check out an item.     Pertinent History  Imparied gait. Pt states that she has had balance issues for too long. Feels more comfortable walking with her rw. Had tests which showed that she has neuropathy. However the doctor at [facility] said that her balance is coming from her back. Just wants to be strong so she can walk.  Started walking with her rw off and on for the past several months.  Also feels like she might need a new sleep number bed. Also had 2 wound surgeries at [name]. Pt fell in February 2019. Had to stop PT  due to the L elbow surgery April 06, 2018 to remove a bursa. The wound still has not healed.   Currently sees a wound doctor.   Feels numbness L foot from the ankle down.  No R foot numbness.  Sometimes unsure of where her L LE is in space.  Pt also states that she tires too quickly.  Has not been able to walk her dog in 2 years. Has not really been doing exercises at home except the sit <> stand. Pt states that prolonged sitting without back support will increase back pain     Limitations  Standing;Walking    How long can you sit comfortably?  1 hr    How long can you stand comfortably?  10 minutes    How long can you walk comfortably?  2 or 3 blocks    Diagnostic tests  EMG testing BLEs and BUEs which were negative    Patient Stated Goals  Improve LE strength. Wants to use the machines at the gym.  Walk more steadily. Be able to take her dog (16 lbs) out on a leash.     Currently in Pain?  Other (Comment)   arms are sore.    Pain Onset  More than a month ago                               PT Education - 11/05/18 1526    Education provided  Yes    Education Details  ther-ex    [employer]) Educated  Patient    Methods  Explanation;Demonstration;Tactile cues;Verbal cues    Comprehension  Returned demonstration;Verbalized understanding      Objectives  Pt ambulating with rw  TTP bilateral greater trochanter L >R   L elbow wound  No latex band allergies   MedbridgeAccess Code: [email or web address]   Ther-ex  CGAto SBAwith standing activities.   Seated manually resisted trunk flexion isometrics in neutral 10x5 seconds for 3 sets  Sit <> stand 5x. Better able to place and maintain her center of gravity over her base of support.   Gait x 200 ft CGA   LOB forward after about 160 ft with min A to recover  Pt states that she saw someone in front of her and was concerned if she was going to run into her (then pt LOB)  Slow gait with sudden stops (to promote ability and confidence to stop and not run into someone in front of her)  32 ft x 4, then 20 ft.  Pt able to stop suddenly when walking slowly. 2 sudden stops with slight unsteadiness but pt able to recover.   Stepping over 2 fallen canes using SPC on R 4x, then 5x Better able to perform with more confidence during 2nd set.   Standing hip abduction resisting 2 lbs 10x each LE to promote glute med muscle use. No complain of pain or discomfort.   Four square step exercise 5x with SPC. Unsteady.   Sit <> stand throughout session with emphasis on femoral control  Improved exercise technique, movement at target joints, use of target muscles after mod verbal, visual, tactile cues.   Continued working on LE balance, as well as gait using SPC. Pt demonstrates fear with performing  gait/balance related activities such as walking with sudden stops as well as stepping over 2 fallen canes using a SPC but improved ability to perform task after practice.  PT Short Term Goals - 10/18/18 1850      PT SHORT TERM GOAL #1   Title  Patient will be independent with her HEP to improve strength and balance.     Time  3    Period  Weeks    Status  On-going    Target Date  09/27/18        PT Long Term Goals - 10/29/18 1643      PT LONG TERM GOAL #1   Title  Patient will improve her 10 MWT to 0.8 m/s or more with rw to promote better community ambulation.     Baseline  0.5 seconds with rw (09/06/2018); 0.63 seconds average with rw (10/18/2018)    Time  8    Period  Weeks    Status  On-going    Target Date  12/27/18      PT LONG TERM GOAL #2   Title  Patient will improve her DGI score using rw or least restrictive AD to 19/24 or more to promote balance.     Baseline  13/24 using rw (09/06/2018); 14/24 using rw (10/29/2018)    Time  8    Period  Weeks    Status  On-going    Target Date  12/27/18      PT LONG TERM GOAL #3   Title  Patient will improve bilateral LE strength by at least 1/2 MMT to promote ability to support herself when performing standing tasks and improve balance.     Time  8    Period  Weeks    Status  On-going      PT LONG TERM GOAL #4   Title  Patient will have a decrease in back pain/ache to 5/10 or less to promote ability to perform standing tasks with less difficulty.    Baseline  8/10 at most for the past 3 months (09/06/2018); 5-6/10 at worst for the past 7 days (10/18/2018)    Time  8    Period  Weeks    Status  Partially Met            Plan - 11/05/18 1549    Clinical Impression Statement  Continued working on LE balance, as well as gait using SPC. Pt demonstrates fear with performing gait/balance related activities such as walking with sudden stops as well as stepping over 2 fallen canes using a SPC but improved ability to  perform task after practice.     Rehab Potential  Fair    Clinical Impairments Affecting Rehab Potential  (-) chronicity of condition, age, comorbidities; (+) motivated    PT Frequency  2x / week    PT Duration  8 weeks    PT Treatment/Interventions  Aquatic Therapy;Gait training;Therapeutic activities;Therapeutic exercise;Balance training;Neuromuscular re-education;Patient/family education;Manual techniques;Dry needling    PT Next Visit Plan  trunk, hip, scapular strengthening, balance, gait, manual techniques PRN    Consulted and Agree with Plan of Care  Patient       Patient will benefit from skilled therapeutic intervention in order to improve the following deficits and impairments:  Abnormal gait, Decreased activity tolerance, Decreased balance, Decreased endurance, Decreased range of motion, Decreased strength, Difficulty walking, Improper body mechanics, Postural dysfunction, Pain, Decreased knowledge of use of DME  Visit Diagnosis: Unsteadiness on feet  History of falling  Muscle weakness (generalized)  Difficulty in walking, not elsewhere classified  Chronic low back pain, unspecified back pain laterality, unspecified whether sciatica present     Problem List  Patient Active Problem List   Diagnosis Date Noted  . Septic olecranon bursitis of left elbow 03/27/2018  . Lymphedema 10/11/2017  . Chronic venous insufficiency 10/11/2017  . Leg pain 10/11/2017  . Leg swelling 10/11/2017  . COPD (chronic obstructive pulmonary disease) ([place]) 10/11/2017  . Essential hypertension 10/11/2017    Joneen Boers PT, DPT   11/05/2018, 7:00 PM  [facility] [facility] [address], [place], [address] Phone: [phone]   Fax:  [phone]  Name: [name] MRN: [id number] Date of Birth: 1930/08/20

## 2018-11-07 ENCOUNTER — Encounter: Payer: Medicare Other | Admitting: Family Medicine

## 2018-11-07 ENCOUNTER — Ambulatory Visit: Payer: Medicare Other

## 2018-11-07 DIAGNOSIS — R262 Difficulty in walking, not elsewhere classified: Secondary | ICD-10-CM

## 2018-11-07 DIAGNOSIS — Z9181 History of falling: Secondary | ICD-10-CM

## 2018-11-07 DIAGNOSIS — G8929 Other chronic pain: Secondary | ICD-10-CM

## 2018-11-07 DIAGNOSIS — M545 Low back pain, unspecified: Secondary | ICD-10-CM

## 2018-11-07 DIAGNOSIS — M6281 Muscle weakness (generalized): Secondary | ICD-10-CM

## 2018-11-07 DIAGNOSIS — T8189XA Other complications of procedures, not elsewhere classified, initial encounter: Secondary | ICD-10-CM | POA: Diagnosis not present

## 2018-11-07 DIAGNOSIS — R2681 Unsteadiness on feet: Secondary | ICD-10-CM

## 2018-11-07 NOTE — Patient Instructions (Signed)
MedbridgeAccess Code: [email or web address]   Hooklying Isometric Clamshell  10x3 red band

## 2018-11-07 NOTE — Therapy (Signed)
[place] [facility] [address], [place], [address] Phone: [phone]   Fax:  [phone]  Physical Therapy Treatment  Patient Details  Name: [name] MRN: [id number] Date of Birth: 1930/07/15 Referring Provider (PT): Jennings Books, MD   Encounter Date: 11/07/2018  PT End of Session - 11/07/18 1344    Visit Number  15    Number of Visits  33    Date for PT Re-Evaluation  12/27/18    Authorization Type  6    Authorization Time Period  of 10 progress report    PT Start Time  1346    PT Stop Time  1431    PT Time Calculation (min)  45 min    Equipment Utilized During Treatment  Gait belt    Activity Tolerance  Patient tolerated treatment well    Behavior During Therapy  WFL for tasks assessed/performed       Past Medical History:  Diagnosis Date  . Anemia   . GERD (gastroesophageal reflux disease)   . Hypertension   . Peripheral vascular disease (HCC)    possible neuropathies in lower extremeties  . PONV (postoperative nausea and vomiting)    happens sometimes but better with pre med of zofran  . Presence of permanent cardiac pacemaker   . Syncope     Past Surgical History:  Procedure Laterality Date  . ABDOMINAL HYSTERECTOMY  1969  . APPENDECTOMY  1969   with hysterectomy  . BACK SURGERY  2001   rods in back. surgery on back x 3  . COLONOSCOPY    . I&D EXTREMITY Left 03/27/2018   Procedure: IRRIGATION AND DEBRIDEMENT LEFT ELBOW / OLECRANON BURSA;  Surgeon: Corky Mull, MD;  Location: [facility];  Service: Orthopedics;  Laterality: Left;  . I&D EXTREMITY Left 06/05/2018   Procedure: IRRIGATION AND DEBRIDEMENT EXTREMITY;  Surgeon: Corky Mull, MD;  Location: [facility];  Service: Orthopedics;  Laterality: Left;  . INSERT / REPLACE / REMOVE PACEMAKER  2004  . JOINT REPLACEMENT Left 2004   partial knee replacement  . OLECRANON BURSECTOMY Left 03/27/2018   Procedure: LEFT OLECRANON BURSA;  Surgeon:  Corky Mull, MD;  Location: [facility];  Service: Orthopedics;  Laterality: Left;  . PACEMAKER INSERTION  2014   dual chamber for complete heart block    There were no vitals filed for this visit.  Subjective Assessment - 11/07/18 1348    Subjective  Back and walking are ok.     Pertinent History  Imparied gait. Pt states that she has had balance issues for too long. Feels more comfortable walking with her rw. Had tests which showed that she has neuropathy. However the doctor at [facility] said that her balance is coming from her back. Just wants to be strong so she can walk.  Started walking with her rw off and on for the past several months.  Also feels like she might need a new sleep number bed. Also had 2 wound surgeries at [name]. Pt fell in February 2019. Had to stop PT due to the L elbow surgery April 06, 2018 to remove a bursa. The wound still has not healed.   Currently sees a wound doctor.   Feels numbness L foot from the ankle down.  No R foot numbness.  Sometimes unsure of where her L LE is in space.  Pt also states that she tires too quickly.  Has not been able  to walk her dog in 2 years. Has not really been doing exercises at home except the sit <> stand. Pt states that prolonged sitting without back support will increase back pain     Limitations  Standing;Walking    How long can you sit comfortably?  1 hr    How long can you stand comfortably?  10 minutes    How long can you walk comfortably?  2 or 3 blocks    Diagnostic tests  EMG testing BLEs and BUEs which were negative    Patient Stated Goals  Improve LE strength. Wants to use the machines at the gym. Walk more steadily. Be able to take her dog (16 lbs) out on a leash.     Currently in Pain?  No/denies    Pain Score  0-No pain    Pain Onset  More than a month ago                               PT Education - 11/07/18 1351    Education provided  Yes    Education Details  ther-ex    [employer])  Educated  Patient    Methods  Explanation;Demonstration;Tactile cues;Verbal cues    Comprehension  Returned demonstration;Verbalized understanding       Objectives  Pt ambulating with rw  TTP bilateral greater trochanter L >R   L elbow wound  No latex band allergies   [employer]Access Code: [email or web address]   Ther-ex  CGAto SBAwith standing activities.   Seated manually resisted trunk flexion isometrics in neutral 10x5 seconds for 3 sets  Gait with SPC x 200 ft CGA     LOB forward after about 170 ft with min A to mod A to recover  Slow gait with sudden stops (to promote ability and confidence to stop and not run into someone in front of her)             32 ft x 4. Back fatigue felt by pt.   Seated bilateral scapular retraction to promote thoracic extension and decrease low back pressure 10x5 seconds for 3 sets  Seated clamshell from elevated mat table resisting red band, hips less than 90 degrees flexion 10x  Supine clamshell 10x3 resisting red band   Reviewed and given as part of her HEP. Pt demonstrated and verbalized understanding.   Seated hip adduction pillow and glute max squeeze 10x5 seconds for 3 sets   Four square step exercise5x with SPC. Unsteady.    Improved exercise technique, movement at target joints, use of target muscles after mod verbal, visual, tactile cues.   Continued working on gentle thoracic extension, trunk and glute strengthening to help decreased pressure to low back to promote ability to perform standing tasks and walk more comfortably and safely.           PT Short Term Goals - 10/18/18 1850      PT SHORT TERM GOAL #1   Title  Patient will be independent with her HEP to improve strength and balance.     Time  3    Period  Weeks    Status  On-going    Target Date  09/27/18        PT Long Term Goals - 10/29/18 1643      PT LONG TERM GOAL #1   Title  Patient will improve her 10 MWT to 0.8 m/s or more  with rw to promote better  community ambulation.     Baseline  0.5 seconds with rw (09/06/2018); 0.63 seconds average with rw (10/18/2018)    Time  8    Period  Weeks    Status  On-going    Target Date  12/27/18      PT LONG TERM GOAL #2   Title  Patient will improve her DGI score using rw or least restrictive AD to 19/24 or more to promote balance.     Baseline  13/24 using rw (09/06/2018); 14/24 using rw (10/29/2018)    Time  8    Period  Weeks    Status  On-going    Target Date  12/27/18      PT LONG TERM GOAL #3   Title  Patient will improve bilateral LE strength by at least 1/2 MMT to promote ability to support herself when performing standing tasks and improve balance.     Time  8    Period  Weeks    Status  On-going      PT LONG TERM GOAL #4   Title  Patient will have a decrease in back pain/ache to 5/10 or less to promote ability to perform standing tasks with less difficulty.    Baseline  8/10 at most for the past 3 months (09/06/2018); 5-6/10 at worst for the past 7 days (10/18/2018)    Time  8    Period  Weeks    Status  Partially Met            Plan - 11/07/18 1343    Clinical Impression Statement  Continued working on gentle thoracic extension, trunk and glute strengthening to help decreased pressure to low back to promote ability to perform standing tasks and walk more comfortably and safely.     Rehab Potential  Fair    Clinical Impairments Affecting Rehab Potential  (-) chronicity of condition, age, comorbidities; (+) motivated    PT Frequency  2x / week    PT Duration  8 weeks    PT Treatment/Interventions  Aquatic Therapy;Gait training;Therapeutic activities;Therapeutic exercise;Balance training;Neuromuscular re-education;Patient/family education;Manual techniques;Dry needling    PT Next Visit Plan  trunk, hip, scapular strengthening, balance, gait, manual techniques PRN    Consulted and Agree with Plan of Care  Patient       Patient will benefit from  skilled therapeutic intervention in order to improve the following deficits and impairments:  Abnormal gait, Decreased activity tolerance, Decreased balance, Decreased endurance, Decreased range of motion, Decreased strength, Difficulty walking, Improper body mechanics, Postural dysfunction, Pain, Decreased knowledge of use of DME  Visit Diagnosis: Unsteadiness on feet  History of falling  Muscle weakness (generalized)  Difficulty in walking, not elsewhere classified  Chronic low back pain, unspecified back pain laterality, unspecified whether sciatica present     Problem List Patient Active Problem List   Diagnosis Date Noted  . Septic olecranon bursitis of left elbow 03/27/2018  . Lymphedema 10/11/2017  . Chronic venous insufficiency 10/11/2017  . Leg pain 10/11/2017  . Leg swelling 10/11/2017  . COPD (chronic obstructive pulmonary disease) ([place]) 10/11/2017  . Essential hypertension 10/11/2017    Joneen Boers PT, DPT   11/07/2018, 6:41 PM  [facility] [address] [place], [address] Phone: [phone]   Fax:  [phone]  Name: [name] MRN: [id number] Date of Birth: 12-May-1930

## 2018-11-13 ENCOUNTER — Ambulatory Visit: Payer: Medicare Other

## 2018-11-14 ENCOUNTER — Encounter: Payer: Medicare Other | Attending: Internal Medicine | Admitting: Internal Medicine

## 2018-11-14 DIAGNOSIS — M069 Rheumatoid arthritis, unspecified: Secondary | ICD-10-CM | POA: Diagnosis not present

## 2018-11-14 DIAGNOSIS — Z79899 Other long term (current) drug therapy: Secondary | ICD-10-CM | POA: Diagnosis not present

## 2018-11-14 DIAGNOSIS — Z950 Presence of cardiac pacemaker: Secondary | ICD-10-CM | POA: Insufficient documentation

## 2018-11-14 DIAGNOSIS — S51002A Unspecified open wound of left elbow, initial encounter: Secondary | ICD-10-CM | POA: Insufficient documentation

## 2018-11-14 DIAGNOSIS — L97905 Non-pressure chronic ulcer of unspecified part of unspecified lower leg with muscle involvement without evidence of necrosis: Secondary | ICD-10-CM | POA: Insufficient documentation

## 2018-11-14 DIAGNOSIS — M351 Other overlap syndromes: Secondary | ICD-10-CM | POA: Insufficient documentation

## 2018-11-14 DIAGNOSIS — M419 Scoliosis, unspecified: Secondary | ICD-10-CM | POA: Diagnosis not present

## 2018-11-14 DIAGNOSIS — H409 Unspecified glaucoma: Secondary | ICD-10-CM | POA: Diagnosis not present

## 2018-11-14 DIAGNOSIS — L98495 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: Secondary | ICD-10-CM | POA: Insufficient documentation

## 2018-11-21 ENCOUNTER — Other Ambulatory Visit
Admission: RE | Admit: 2018-11-21 | Discharge: 2018-11-21 | Disposition: A | Discharge status: Home / Self Care | Payer: Medicare Other | Source: Other Acute Inpatient Hospital | Attending: Internal Medicine | Admitting: Internal Medicine

## 2018-11-21 ENCOUNTER — Ambulatory Visit: Payer: Medicare Other

## 2018-11-21 ENCOUNTER — Encounter: Payer: Medicare Other | Admitting: Internal Medicine

## 2018-11-21 DIAGNOSIS — L97905 Non-pressure chronic ulcer of unspecified part of unspecified lower leg with muscle involvement without evidence of necrosis: Secondary | ICD-10-CM | POA: Diagnosis not present

## 2018-11-21 DIAGNOSIS — B999 Unspecified infectious disease: Secondary | ICD-10-CM | POA: Insufficient documentation

## 2018-11-23 NOTE — Progress Notes (Signed)
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 time. I explained that obviously we have to wait for the wound to get to a certain point along his healing path before we can initiate the Prisma otherwise it will not be effective. Therefore once the wound became Natasha, Barnett ([id number]) clean it was then time to initiate the Prisma. Nonetheless good news is she is noting excellent improvement she does still have some discomfort but nothing as significant as previously noted. 04/17/18 on evaluation today patient appears to be doing very well and in fact her right lower extremity ulcer has completely healed at this point I'm pleased with this. The left lower extremity ulcer seem to be doing better although she still does have some openings noted the Prisma I think is helping more than the Xeroform was in my pinion. With that being said she still has a lot of healing to do in this regard. 04/27/18 on evaluation today patient appears to be doing very well in regard to her left lower Trinity ulcers. She has been tolerating the dressing changes without complication. I do have a note from her orthopedic  surgeon today and they would like for me to help with treating her left elbow surgery site where she had the bursa removed and this was performed roughly 4 weeks ago according to the note that I reviewed. She has been placed on Bactrim DS by need for her leg wounds this probably helped a little bit with the left elbow surgery site. Obviously I do think this is something we can try to help her out with. 05/04/18 on evaluation today patient appears to be doing well in regard to her left anterior lower  Trinity ulcers. She is making good progress which is great news. Unfortunately her elbow which we are also managing at this point in time has not made as much progress unfortunately. She has been tolerating the dressing changes without complication. She did see Dr. Darleen Crocker earlier today and he states that he's willing to give this three weeks to see if she's making any progress with wound care. However he states that she's really not then he will need to go back in and perform further surgery. Obviously she is trying to avoid surgery if at all possible although I'm not sure if this is going to be possible or least not that quickly. 05/11/18 on evaluation today patient appears to be doing very well in regard to her left lower extremity ulcers. Unfortunately in regard to her elbow this is very slow coming about as far as any improvement is concerned. I do feel like there may be a little bit more granulation noted in the base of the wound but nothing too significant unfortunately. I still can probe bone in the proximal portion of the wound which obviously explain to the patient is not good. She will be having a follow-up with her orthopedic surgeon in the next couple of weeks. In the meantime we are trying to do as much as we can to try to show signs of improvement in healing to avoid the need for any additional and further surgery. Nonetheless I explained to the patient yet again today I'm not sure if that is  going to be feasible or not obviously it's more risk for her to continue to have an open wound with bone exposure then to the back in for additional surgery even though I know she doesn't want to go that route. 05/15/18 on evaluation today patient presents for follow-up concerning her ongoing lower extremity ulcers on the left as well as the left elbow ulcer. She has at this point in time been tolerating the dressing changes without complication. Her left lower extremity ulcer appears to be doing very well. In regard to the left elbow ulcer she actually does seem to have additional granulation today which is good news. I am definitely seeing signs of improvement although obviously this is somewhat slow improvement. Nonetheless I'm hopeful we will be able to avoid her having to have any further surgery but again that would definitely be a conversation between herself as well as her surgeon once he sees her for reevaluation. Otherwise she does want to see about having a three order compression stockings for her today 05/21/18 on evaluation today patient appears to be doing well in regard to her left lower surety ulcer. This is almost completely healed and seems to be progressing very nicely. With that being said her left elbow is another story. I'm not really convinced in the past three weeks we've seen a significant improvement in this wound. With that being said if this is something that there is no surgical option for him we have to continue to work on this from the standpoint of conservative management with wound care she may make improvement given time. Nonetheless it appears that her surgeon is somewhat concerned about the possibility of infection and really is leaning towards additional surgery to try and help close this wound. Nonetheless the patient is still unsure of exactly what to do. 05/29/18 on evaluation today patient appears to be doing well in regard to her left lower extremity ulcer.  She's been tolerating the dressing changes without complication  which is good news. With that being said she's been having issues specifically with her elbow she did see her surgeon Dr. Joice Lofts and he is recommending a repeat surgery to the left elbow in order to correct the issue. The patient is still somewhat unsure of this but feels like this may be better than trying to take time to let this heal over a longer period of time through normal wound care measures. Again I explained that I agree this may be a faster way to go if her surgeon feels that this is indeed a good direction to take. Obviously only he can make the judgment on whether or not the surgery would likely be successful. 06/04/18 on evaluation today patient actually presents for follow-up concerning her left elbow and left lower from the ulcer she seems to be doing very well at this point in time. She has been tolerating the dressing changes without complication. With that being said her elbow is not significantly better she actually is scheduled for surgery tomorrow. 07/04/18; the patient had an area on her left leg that is remaining closed. The open area she has now is a postsurgical wound on the left elbow. I think we have clearance from the surgeon to see this now. [address] [place] VMarland Kitchen ([id number]) 07/11/18; we're currently dealing with a surgical wound on the left olecranon process. The patient complains of a lot of pain and drainage. When I saw her last week we did an x-ray that showed soft tissue wound and probable elbow joint effusion but no erosion to suggest osteomyelitis. The culture I did of this was somewhat surprisingly negative. She has a small open wound with not a viable surface there is considerable undermining relative to the wound size. She is on methotrexate for rheumatoid arthritis/overlap syndrome also plaquenil. We've been using silver collagen 07/18/18-She is seen in follow-up evaluation for a left  elbow wound. There is essentially no change. She is currently on Zithromax and will complete that on Friday, there is no indication to extend this. We will change to iodosorb/iodoflex and monitor for response 07/25/18-She is seen in follow-up evaluation for left elbow wound. The wound is stable with no overt evidence of infection. She has counseled with her rheumatologist. She is wanting to restart her methotrexate; a culture was obtained to rule out occult infection before starting her methotrexate. We will continue with Iodosorb/Iodoflex and she will follow-up next week. 08/01/18; this is a difficult wound over her left olecranon process. There is been concerned about infection although cultures including one done last week were negative. Pending 3 weeks ago I gave her an empiric course of antibiotics. She is having a lot of rheumatologic pain in her hands with pain and stiffness. She wants to go on her weekly methotrexate and I think it would be reasonable to do so. We have been using Iodoflex 08/01/18; difficult wound over her left olecranon process. She started back on methotrexate last week because of rheumatologic pain in her hands. We have been using Iodoflex to try and clean out the wound bed. She has been approved for Graphix PL 08/15/18; 2 week follow-up. Difficult wound over her left olecranon process. Graphix PL #1 with collagen backing 08/22/18; one-week follow-up. Difficult wound over her left olecranon process. Graphix PL #2 08/29/18; no major improvement. Difficult wound over her left olecranon process. Still considerable undermining. Graphics PL #3 1 week follow-up. Graphix #4 09/12/18 graphics #5. Some improvement in wound area although the undermining superiorly  still has not closed down as much as I would like 09/19/18; Graphix #6 I think there is improvement in the undermining from 7 to 9:00. Wound bed looks healthy. 09/26/18 Graffix #7 undermining is 0.5 cm maximally at roughly  8:00. From 12 to 7:00 the tissue is adherent which is a major improvement there is some advancing skin from this side. 10/03/18; Graphix #8 no major changes from last week 10/10/18 Graffix #9 There are improvements. There appears to be granulation coming up to the surface here and there is a lot less undermining at 8:00. 10/17/18. Graffix #10; Dimensions are improved less undermining surface felt the but the wound is still open. Initially a surgical wound following a bursectomy 10/24/18; Graffix #11. This is really stalled over the last 2 weeks. If there is no further improvement this will be the last application.The final option for this difficult area would be plastic surgery and will set up a consult with Dr. Marina Goodell in [facility] 10/31/18; wound looks about the same. The undermining superiorly is 0.7 cm. On the lateral edges perhaps some improvement there is no drainage. 11-07-2018 patient seen today for follow-up and management of left elbow wound. She has completed a total of 11 treatments of the graffix with not much improvement. She has an upcoming appointment with plastic surgery to assist with additional treatment options for the left elbow wound on 11/19/18. There is significant amount surrounding undermining of the wound is 0.9 cm. Currently prescribed methotrexate. Wound is being treated with Indoform and border dressing. No drainage from wound. No fever, chills. or pain. 11/21/18; the patient continues to have the wound looking roughly the same with undermining from about 12 to 6:00. This has not changed all that much. She does have skin irritation around the wound that looks like drainage maceration issues. The patient states that she was not able to have her wound dressing changed because of illness in the person he usually does this. She also did not attend her clinic appointment today with Dr. Marina Goodell because of transportation issues. She is rebooked for some time in mid  January Objective Natasha, Barnett ([id number]) Constitutional Sitting or standing Blood Pressure is within target range for patient.. Pulse regular and within target range for patient.Marland Kitchen Respirations regular, non-labored and within target range.. Temperature is normal and within the target range for the patient.Marland Kitchen appears in no distress. Vitals Time Taken: 11:22 AM, Height: 60 in, Weight: 123 lbs, BMI: 24, Temperature: 97.9 F, Pulse: 75 bpm, Respiratory Rate: 18 breaths/min, Blood Pressure: 133/74 mmHg. General Notes: When exam; the patient has a small open area on the tip of the olecranon process although the orifice looks slightly larger than the last time I saw this. The undermining is about the same. What I can see of the baseline tissue still looks reasonably healthy. No debridement was necessary. There is erythema around this area but I think this is some form of irritant dermatitis rather than cellulitis. There was no palpable tenderness. Integumentary (Hair, Skin) Wound #9 status is Open. Original cause of wound was Trauma. The wound is located on the Left Elbow. The wound measures 0.8cm length x 0.6cm width x 0.3cm depth; 0.377cm^2 area and 0.113cm^3 volume. There is Fat Layer (Subcutaneous Tissue) Exposed exposed. There is no tunneling noted, however, there is undermining starting at 2:00 and ending at 11:00 with a maximum distance of 0.3cm. There is a medium amount of purulent drainage noted. The wound margin is distinct with the outline attached to the  wound base. There is small (1-33%) pink granulation within the wound bed. There is a medium (34-66%) amount of necrotic tissue within the wound bed including Adherent Slough. The periwound skin appearance exhibited: Maceration, Erythema. The periwound skin appearance did not exhibit: Callus, Crepitus, Excoriation, Induration, Rash, Scarring, Dry/Scaly, Atrophie Blanche, Cyanosis, Ecchymosis, Hemosiderin Staining, Mottled,  Pallor, Rubor. The surrounding wound skin color is noted with erythema which is circumferential. Periwound temperature was noted as No Abnormality. The periwound has tenderness on palpation. 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 Anesthetic (add to Medication List): Wound #9 Left Elbow: Topical Lidocaine 4% cream applied to wound bed prior to debridement (In Clinic Only). Skin Barriers/Peri-Wound Care: Wound #9 Left Elbow: Skin Prep Primary Wound Dressing: Wound #9 Left Elbow: Other: Natasha, Barnett ([id number]) Secondary Dressing: Wound #9 Left Elbow: Boardered Foam Dressing Dressing Change Frequency: Wound #9 Left Elbow: Change dressing every other day. Follow-up Appointments: Wound #9 Left Elbow: Return Appointment in 1 week. Laboratory ordered were: Wound culture routine - Left Elbow #1 I'm going to continue with the Endoform possibly until she is seen by plastic surgery in mid-January #2 my other thoughts would include trials of Oasis or perhaps regranex. Electronic Signature(s) Signed: 11/21/2018 4:51:45 PM By: Baltazar Najjar MD Entered By: Baltazar Najjar on 11/21/2018 12:09:22 [name] ([id number]) -------------------------------------------------------------------------------- SuperBill Details Patient Name: [name] Date of Service: 11/21/2018 Medical Record Number: [id number] Patient Account Number: [id number] Date of Birth/Sex: 08-18-1930 (84 y.o. 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

## 2018-11-23 NOTE — Progress Notes (Signed)
Natasha, Barnett ([id number]) Visit Report for 11/14/2018 Arrival Information Details Patient Name: [name], [name]. Date of Service: 11/14/2018 11:15 AM Medical Record Number: [id number] Patient Account Number: [id number] Date of Birth/Sex: January 05, 1930 (84 y.o. F) Treating RN: [name] Primary Care [name]: [name] Other Clinician: Referring [name]: [name] Treating [name]: [name] in Treatment: 31 Visit Information History Since Last Visit Added or deleted any medications: No Patient Arrived: Walker Any new allergies or adverse reactions: No Arrival Time: 11:29 Had a fall or experienced change in No Accompanied By: self activities of daily living that may affect Transfer Assistance: None risk of falls: Patient Identification Verified: Yes Signs or symptoms of abuse/neglect since last visito No Secondary Verification Process Yes Hospitalized since last visit: No Completed: Implantable device outside of the clinic excluding No Patient Requires Transmission-Based No cellular tissue based products placed in the center Precautions: since last visit: Patient Has Alerts: Yes Has Dressing in Place as Prescribed: Yes Patient Alerts: non compressible left Pain Present Now: No leg Electronic Signature(s) Signed: 11/14/2018 4:48:38 PM By: [name] Entered By: [name] on 11/14/2018 11:31:11 [name] ([id number]) -------------------------------------------------------------------------------- Clinic Level of Care Assessment Details Patient Name: [name] Date of Service: 11/14/2018 11:15 AM Medical Record Number: [id number] Patient Account Number: [id number] Date of Birth/Sex: 12/25/1929 (84 y.o. F) Treating RN: [name] Primary Care Berania Peedin: [name] Other Clinician: Referring [name]: [name] Treating [name]/Extender: [name] in Treatment: 4 Clinic Level of Care Assessment  Items TOOL 4 Quantity Score []  - Use when only an EandM is performed on FOLLOW-UP visit 0 ASSESSMENTS - Nursing Assessment / Reassessment []  - Reassessment of Co-morbidities (includes updates in patient status) 0 X- 1 5 Reassessment of Adherence to Treatment Plan ASSESSMENTS - Wound and Skin Assessment / Reassessment X - Simple Wound Assessment / Reassessment - one wound 1 5 []  - 0 Complex Wound Assessment / Reassessment - multiple wounds []  - 0 Dermatologic / Skin Assessment (not related to wound area) ASSESSMENTS - Focused Assessment []  - Circumferential Edema Measurements - multi extremities 0 []  - 0 Nutritional Assessment / Counseling / Intervention []  - 0 Lower Extremity Assessment (monofilament, tuning fork, pulses) []  - 0 Peripheral Arterial Disease Assessment (using hand held doppler) ASSESSMENTS - Ostomy and/or Continence Assessment and Care []  - Incontinence Assessment and Management 0 []  - 0 Ostomy Care Assessment and Management (repouching, etc.) PROCESS - Coordination of Care X - Simple Patient / Family Education for ongoing care 1 15 []  - 0 Complex (extensive) Patient / Family Education for ongoing care []  - 0 Staff obtains , Records, Test Results / Process Orders []  - 0 Staff telephones HHA, Nursing Homes / Clarify orders / etc []  - 0 Routine Transfer to another Facility (non-emergent condition) []  - 0 Routine Hospital Admission (non-emergent condition) []  - 0 New Admissions / / Ordering NPWT, Apligraf, etc. []  - 0 Emergency Hospital Admission (emergent condition) X- 1 10 Simple Discharge Coordination [name] ( ) []  - 0 Complex (extensive) Discharge Coordination PROCESS - Special Needs []  - Pediatric / Minor Patient Management 0 []  - 0 Isolation Patient Management []  - 0 Hearing / Language / Visual special needs []  - 0 Assessment of Community assistance (transportation, D/C planning, etc.) []  -  0 Additional assistance / Altered mentation []  - 0 Support Surface(s) Assessment (bed, cushion, seat, etc.) INTERVENTIONS - Wound Cleansing / Measurement X - Simple Wound Cleansing - one wound 1 5 []  -  0 Complex Wound Cleansing - multiple wounds X- 1 5 Wound Imaging (photographs - any number of wounds) []  - 0 Wound Tracing (instead of photographs) X- 1 5 Simple Wound Measurement - one wound []  - 0 Complex Wound Measurement - multiple wounds INTERVENTIONS - Wound Dressings X - Small Wound Dressing one or multiple wounds 1 10 []  - 0 Medium Wound Dressing one or multiple wounds []  - 0 Large Wound Dressing one or multiple wounds []  - 0 Application of Medications - topical []  - 0 Application of Medications - injection INTERVENTIONS - Miscellaneous []  - External ear exam 0 []  - 0 Specimen Collection (cultures, biopsies, blood, body fluids, etc.) []  - 0 Specimen(s) / Culture(s) sent or taken to Lab for analysis []  - 0 Patient Transfer (multiple staff / Nurse, adult / Similar devices) []  - 0 Simple Staple / Suture removal (25 or less) []  - 0 Complex Staple / Suture removal (26 or more) []  - 0 Hypo / Hyperglycemic Management (close monitor of Blood Glucose) []  - 0 Ankle / Brachial Index (ABI) - do not check if billed separately X- 1 5 Vital Signs [name] ([id number]) Has the patient been seen at the hospital within the last three years: Yes Total Score: 65 Level Of Care: New/Established - Level 2 Electronic Signature(s) Signed: 11/14/2018 6:02:29 PM By: Natasha Gurney, BSN, RN, CWS, [name] Entered By: [name] on 11/14/2018 11:46:56 [name] ([id number]) -------------------------------------------------------------------------------- Encounter Discharge Information Details Patient Name: [name] Date of Service: 11/14/2018 11:15 AM Medical Record Number: [id number] Patient Account Number: [id number] Date of Birth/Sex: 05-Jan-1930 (84 y.o.  F) Treating RN: [name] Primary Care Jaramie Bastos: [name] Other Clinician: Referring [name]: [name] Treating Illyria Sobocinski/Extender: Natasha [place] in Treatment: 59 Encounter Discharge Information Items Discharge Condition: Stable Ambulatory Status: Ambulatory Discharge Destination: Home Transportation: Private Auto Accompanied By: self Schedule Follow-up Appointment: Yes Clinical Summary of Care: Electronic Signature(s) Signed: 11/14/2018 4:48:38 PM By: [name] Entered By: [name] on 11/14/2018 11:57:35 [name] ([id number]) -------------------------------------------------------------------------------- Lower Extremity Assessment Details Patient Name: [name] Date of Service: 11/14/2018 11:15 AM Medical Record Number: [id number] Patient Account Number: [id number] Date of Birth/Sex: [birth year] (84 y.o. F) Treating RN: [name] Primary Care Jedrek Dinovo: [name] Other Clinician: Referring Norelle Runnion: [name] Treating Emerick Weatherly/Extender: Natasha [place] in Treatment: 48 Electronic Signature(s) Signed: 11/14/2018 4:48:38 PM By: [name] Entered By: [name] on 11/14/2018 11:32:31 [name] ([id number]) -------------------------------------------------------------------------------- Multi Wound Chart Details Patient Name: [name] Date of Service: 11/14/2018 11:15 AM Medical Record Number: [id number] Patient Account Number: [id number] Date of Birth/Sex: 1929/12/20 (84 y.o. F) Treating RN: [name] Primary Care Lennis Rader: [name] Other Clinician: Referring Stark Aguinaga: [name] Treating Deion Forgue/Extender: Natasha [place] in Treatment: 77 Vital Signs Height(in): 60 Pulse(bpm): 67 Weight(lbs): 123 Blood Pressure(mmHg): 141/54 Body Mass Index(BMI): 24 Temperature(F): 98.0 Respiratory Rate 18 (breaths/min): Photos: [N/A:N/A] Wound Location: Left Elbow N/A N/A Wounding Event:  Trauma N/A N/A Primary Etiology: Trauma, Other N/A N/A Comorbid History: Cataracts, Glaucoma, Optic N/A N/A Neuritis, Chronic sinus problems/congestion, Middle ear problems Date Acquired: 01/26/2018 N/A N/A Weeks of Treatment: 28 N/A N/A Wound Status: Open N/A N/A Measurements L x W x D 0.6x0.5x0.3 N/A N/A (cm) Area (cm) : 0.236 N/A N/A Volume (cm) : 0.071 N/A N/A % Reduction in Area: -232.40% N/A N/A % Reduction in Volume: -407.10% N/A N/A Starting Position 1 5 (o'clock): Ending Position 1 12 (  o'clock): Maximum Distance 1 (cm): 0.5 Undermining: Yes N/A N/A Classification: Full Thickness With Exposed N/A N/A Support Structures Exudate Amount: Medium N/A N/A Exudate Type: Purulent N/A N/A Exudate Color: yellow, brown, green N/A N/A Wound Margin: Distinct, outline attached N/A N/A Granulation Amount: Small (1-33%) N/A N/A PALMINA, PRZYBYL ([id number]) Granulation Quality: Pink N/A N/A Necrotic Amount: Medium (34-66%) N/A N/A Exposed Structures: Fat Layer (Subcutaneous N/A N/A Tissue) Exposed: Yes Fascia: No Tendon: No Muscle: No Joint: No Bone: No Epithelialization: None N/A N/A Periwound Skin Texture: Excoriation: No N/A N/A Induration: No Callus: No Crepitus: No Rash: No Scarring: No Periwound Skin Moisture: Maceration: Yes N/A N/A Dry/Scaly: No Periwound Skin Color: Erythema: Yes N/A N/A Atrophie Blanche: No Cyanosis: No Ecchymosis: No Hemosiderin Staining: No Mottled: No Pallor: No Rubor: No Erythema Location: Circumferential N/A N/A Temperature: No Abnormality N/A N/A Tenderness on Palpation: Yes N/A N/A Wound Preparation: Ulcer Cleansing: N/A N/A Rinsed/Irrigated with Saline Topical Anesthetic Applied: Other: lidocaine 4% Treatment Notes Electronic Signature(s) Signed: 11/21/2018 4:51:45 PM By: Baltazar Najjar MD Entered By: Baltazar Najjar on 11/14/2018 11:49:42 [name]  ([id number]) -------------------------------------------------------------------------------- Multi-Disciplinary Care Plan Details Patient Name: [name] Date of Service: 11/14/2018 11:15 AM Medical Record Number: [id number] Patient Account Number: [id number] Date of Birth/Sex: August 02, 1930 (84 y.o. F) Treating RN: [name] Primary Care Powell Halbert: [name] Other Clinician: Referring Fenix Ruppe: [name] Treating Keath Matera/Extender: Natasha [place] in Treatment: 29 Active Inactive Soft Tissue Infection Nursing Diagnoses: Impaired tissue integrity Goals: Patient's soft tissue infection will resolve Date Initiated: 07/18/2018 Target Resolution Date: 07/27/2018 Goal Status: Active Interventions: Assess signs and symptoms of infection every visit Treatment Activities: Culture and sensitivity : 07/18/2018 Systemic antibiotics : 07/18/2018 Notes: Wound/Skin Impairment Nursing Diagnoses: Impaired tissue integrity Knowledge deficit related to ulceration/compromised skin integrity Goals: Patient/caregiver will verbalize understanding of skin care regimen Date Initiated: 11/14/2017 Target Resolution Date: 11/28/2017 Goal Status: Active Ulcer/skin breakdown will have a volume reduction of 30% by week 4 Date Initiated: 11/14/2017 Target Resolution Date: 11/28/2017 Goal Status: Active Interventions: Assess patient/caregiver ability to obtain necessary supplies Assess patient/caregiver ability to perform ulcer/skin care regimen upon admission and as needed Assess ulceration(s) every visit Treatment Activities: Skin care regimen initiated : 11/14/2017 RANEA, RENEGAR ([id number]) Notes: Electronic Signature(s) Signed: 11/14/2018 6:02:29 PM By: Natasha Gurney, BSN, RN, CWS, [name] Entered By: [name] on 11/14/2018 11:42:05 [name] ([id number]) -------------------------------------------------------------------------------- Pain Assessment  Details Patient Name: [name] Date of Service: 11/14/2018 11:15 AM Medical Record Number: [id number] Patient Account Number: [id number] Date of Birth/Sex: 1930/01/02 (84 y.o. F) Treating RN: [name] Primary Care Crews Mccollam: [name] Other Clinician: Referring Emilio Baylock: [name] Treating Felipa Laroche/Extender: Natasha [place] in Treatment: 31 Active Problems Location of Pain Severity and Description of Pain Patient Has Paino No Site Locations Character of Pain Describe the Pain: Aching Pain Management and Medication Current Pain Management: Goals for Pain Management pt stated has intermittent pain to wound site. Electronic Signature(s) Signed: 11/14/2018 4:48:38 PM By: [name] Entered By: [name] on 11/14/2018 11:31:56 [name] ([id number]) -------------------------------------------------------------------------------- Patient/Caregiver Education Details Patient Name: [name] Date of Service: 11/14/2018 11:15 AM Medical Record Number: [id number] Patient Account Number: [id number] Date of Birth/Gender: 09/27/1930 (84 y.o. F) Treating RN: [name] Primary Care Physician: [name] Other Clinician: Referring Physician: Aram Barnett Treating Physician/Extender: Natasha Galva in Treatment: 72 Education Assessment Education Provided To: Patient Education Topics Provided Wound/Skin Impairment: Handouts: Caring  for Your Ulcer, Other: continue wound care as prescribed Methods: Demonstration, Explain/Verbal Responses: State content correctly Electronic Signature(s) Signed: 11/14/2018 4:48:38 PM By: [name] Entered By: [name] on 11/14/2018 11:58:07 [name] ([id number]) -------------------------------------------------------------------------------- Wound Assessment Details Patient Name: [name] Date of Service: 11/14/2018 11:15 AM Medical Record Number: [id number] Patient Account Number:  [id number] Date of Birth/Sex: 1930-11-08 (84 y.o. F) Treating RN: [name] Primary Care Jerome Otter: [name] Other Clinician: Referring Arly Salminen: [name] Treating Brentlee Sciara/Extender: Natasha [place] in Treatment: 52 Wound Status Wound Number: 9 Primary Trauma, Other Etiology: Wound Location: Left Elbow Wound Open Wounding Event: Trauma Status: Date Acquired: 01/26/2018 Comorbid Cataracts, Glaucoma, Optic Neuritis, Chronic Weeks Of Treatment: 28 History: sinus problems/congestion, Middle ear problems Clustered Wound: No Photos Wound Measurements Length: (cm) 0.6 Width: (cm) 0.5 Depth: (cm) 0.3 Area: (cm) 0.236 Volume: (cm) 0.071 % Reduction in Area: -232.4% % Reduction in Volume: -407.1% Epithelialization: None Tunneling: No Undermining: Yes Starting Position (o'clock): 5 Ending Position (o'clock): 12 Maximum Distance: (cm) 0.5 Wound Description Full Thickness With Exposed Support Foul Odo Classification: Structures Slough/F Wound Margin: Distinct, outline attached Exudate Medium Amount: Exudate Type: Purulent Exudate Color: yellow, brown, green r After Cleansing: No ibrino No Wound Bed Granulation Amount: Small (1-33%) Exposed Structure Granulation Quality: Pink Fascia Exposed: No Necrotic Amount: Medium (34-66%) Fat Layer (Subcutaneous Tissue) Exposed: Yes Necrotic Quality: Adherent Slough Tendon Exposed: No FATEMAH, POURCIAU ([id number]) Muscle Exposed: No Joint Exposed: No Bone Exposed: No Periwound Skin Texture Texture Color No Abnormalities Noted: No No Abnormalities Noted: No Callus: No Atrophie Blanche: No Crepitus: No Cyanosis: No Excoriation: No Ecchymosis: No Induration: No Erythema: Yes Rash: No Erythema Location: Circumferential Scarring: No Hemosiderin Staining: No Mottled: No Moisture Pallor: No No Abnormalities Noted: No Rubor: No Dry / Scaly: No Maceration: Yes Temperature / Pain Temperature: No  Abnormality Tenderness on Palpation: Yes Wound Preparation Ulcer Cleansing: Rinsed/Irrigated with Saline Topical Anesthetic Applied: Other: lidocaine 4%, Electronic Signature(s) Signed: 11/14/2018 6:02:29 PM By: Natasha Gurney, BSN, RN, CWS, [name] Entered By: [name] on 11/14/2018 11:43:54 [name] ([id number]) -------------------------------------------------------------------------------- Vitals Details Patient Name: [name] Date of Service: 11/14/2018 11:15 AM Medical Record Number: [id number] Patient Account Number: [id number] Date of Birth/Sex: January 19, 1930 (84 y.o. F) Treating RN: [name] Primary Care Aaryan Essman: [name] Other Clinician: Referring Nisaiah Bechtol: [name] Treating Inaaya Vellucci/Extender: Natasha Morrice in Treatment: 70 Vital Signs Time Taken: 11:32 Temperature (F): 98.0 Height (in): 60 Pulse (bpm): 67 Weight (lbs): 123 Respiratory Rate (breaths/min): 18 Body Mass Index (BMI): 24 Blood Pressure (mmHg): 141/54 Reference Range: 80 - 120 mg / dl Electronic Signature(s) Signed: 11/14/2018 4:48:38 PM By: [name] Entered ByRema Barnett on 11/14/2018 11:32:21

## 2018-11-23 NOTE — Progress Notes (Addendum)
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 have some openings noted the Prisma I think is helping more than the Xeroform was in my pinion. With that being said she still has a lot of healing to do in this regard. 04/27/18 on evaluation today patient appears to be doing very well in regard to her left lower Trinity ulcers. She has been tolerating the dressing changes without complication. I do have a note from her orthopedic surgeon today and they would like for me to help with treating her left elbow surgery site where she had the bursa removed and this was performed roughly 4 weeks ago according to the note that I reviewed. She has been placed on Bactrim DS by need for her leg wounds this probably helped a little bit with the left elbow surgery site. Obviously I do think this is something we can try to help her out with. 05/04/18 on evaluation today patient appears to be doing well in regard to her left anterior lower Trinity ulcers. She is making good progress which is great news. Unfortunately her elbow which we are also managing at this point in time has not made as much progress unfortunately. She has been tolerating the dressing changes without complication. She did  see Dr. Darleen Crocker earlier today and he states that he's willing to give this three weeks to see if she's making any progress with wound care. However he states that she's really not then he will need to go back in and perform further surgery. Obviously she is trying to avoid surgery if at all possible although I'm not sure if this is going to be possible or least  not that quickly. 05/11/18 on evaluation today patient appears to be doing very well in regard to her left lower extremity ulcers. Unfortunately in regard to her elbow this is very slow coming about as far as any improvement is concerned. I do feel like there may be a little bit more granulation noted in the base of the wound but nothing too significant unfortunately. I still can probe bone in the proximal portion of the wound which obviously explain to the patient is not good. She will be having a follow-up with her orthopedic surgeon in the next couple of weeks. In the meantime we are trying to do as much as we can to try to show signs of improvement in healing to avoid the need for any additional and further surgery. Nonetheless I explained to the patient yet again today I'm not sure if that is going to be feasible or not obviously it's more risk for her to continue to have an open wound with bone exposure then to the back in for additional surgery even though I know she doesn't want to go that route. 05/15/18 on evaluation today patient presents for follow-up concerning her ongoing lower extremity ulcers on the left as well as the left elbow ulcer. She has at this point in time been tolerating the dressing changes without complication. Her left lower extremity ulcer appears to be doing very well. In regard to the left elbow ulcer she actually does seem to have additional granulation today which is good news. I am definitely seeing signs of improvement although obviously this is somewhat slow improvement. Nonetheless I'm hopeful we will be able  to avoid her having to have any further surgery but again that would definitely be a conversation between herself as well as her surgeon once he sees her for reevaluation. Otherwise she does want to see about having a three order compression stockings for her today 05/21/18 on evaluation today patient appears to be doing well in regard to her left lower surety ulcer. This is almost completely healed and seems to be progressing very nicely. With that being said her left elbow is another story. I'm not really convinced in the past three weeks we've seen a significant improvement in this wound. With that being said if this is something that there is no surgical option for him we have to continue to work on this from the standpoint of conservative management with wound care she may make improvement given time. Nonetheless it appears that her surgeon is somewhat concerned about the possibility of infection and really is leaning towards additional surgery to try and help close this wound. Nonetheless the patient is still unsure of exactly what to do. 05/29/18 on evaluation today patient appears to be doing well in regard to her left lower extremity ulcer. She's been tolerating the dressing changes without complication which is good news. With that being said she's been having issues specifically with her elbow she did see her surgeon Dr. Joice Lofts and he is recommending a repeat surgery to the left elbow in order to correct the issue. The patient is still somewhat unsure of this but feels like this may be better than trying to take time to let this heal over a longer period of time through normal wound care measures. Again I explained that I agree this may be a faster way to go if her surgeon feels that this is indeed a good direction to take. Obviously only he can make the judgment on whether or  not the surgery would likely be successful. 06/04/18 on evaluation today patient actually presents for follow-up  concerning her left elbow and left lower from the ulcer she seems to be doing very well at this point in time. She has been tolerating the dressing changes without complication. With that being said her elbow is not significantly better she actually is scheduled for surgery tomorrow. 07/04/18; the patient had an area on her left leg that is remaining closed. The open area she has now is a postsurgical wound on the left elbow. I think we have clearance from the surgeon to see this now. [address], [place] VMarland Kitchen ([id number]) 07/11/18; we're currently dealing with a surgical wound on the left olecranon process. The patient complains of a lot of pain and drainage. When I saw her last week we did an x-ray that showed soft tissue wound and probable elbow joint effusion but no erosion to suggest osteomyelitis. The culture I did of this was somewhat surprisingly negative. She has a small open wound with not a viable surface there is considerable undermining relative to the wound size. She is on methotrexate for rheumatoid arthritis/overlap syndrome also plaquenil. We've been using silver collagen 07/18/18-She is seen in follow-up evaluation for a left elbow wound. There is essentially no change. She is currently on Zithromax and will complete that on Friday, there is no indication to extend this. We will change to iodosorb/iodoflex and monitor for response 07/25/18-She is seen in follow-up evaluation for left elbow wound. The wound is stable with no overt evidence of infection. She has counseled with her rheumatologist. She is wanting to restart her methotrexate; a culture was obtained to rule out occult infection before starting her methotrexate. We will continue with Iodosorb/Iodoflex and she will follow-up next week. 08/01/18; this is a difficult wound over her left olecranon process. There is been concerned about infection although cultures including one done last week were negative. Pending 3 weeks  ago I gave her an empiric course of antibiotics. She is having a lot of rheumatologic pain in her hands with pain and stiffness. She wants to go on her weekly methotrexate and I think it would be reasonable to do so. We have been using Iodoflex 08/01/18; difficult wound over her left olecranon process. She started back on methotrexate last week because of rheumatologic pain in her hands. We have been using Iodoflex to try and clean out the wound bed. She has been approved for Graphix PL 08/15/18; 2 week follow-up. Difficult wound over her left olecranon process. Graphix PL #1 with collagen backing 08/22/18; one-week follow-up. Difficult wound over her left olecranon process. Graphix PL #2 08/29/18; no major improvement. Difficult wound over her left olecranon process. Still considerable undermining. Graphics PL #3 1 week follow-up. Graphix #4 09/12/18 graphics #5. Some improvement in wound area although the undermining superiorly still has not closed down as much as I would like 09/19/18; Graphix #6 I think there is improvement in the undermining from 7 to 9:00. Wound bed looks healthy. 09/26/18 Graffix #7 undermining is 0.5 cm maximally at roughly 8:00. From 12 to 7:00 the tissue is adherent which is a major improvement there is some advancing skin from this side. 10/03/18; Graphix #8 no major changes from last week 10/10/18 Graffix #9 There are improvements. There appears to be granulation coming up to the surface here and there is a lot less undermining at 8:00. 10/17/18. Graffix #10; Dimensions are improved less undermining surface felt the but the wound  is still open. Initially a surgical wound following a bursectomy 10/24/18; Graffix #11. This is really stalled over the last 2 weeks. If there is no further improvement this will be the last application.The final option for this difficult area would be plastic surgery and will set up a consult with Dr. Marina Goodell in [facility] 10/31/18; wound looks  about the same. The undermining superiorly is 0.7 cm. On the lateral edges perhaps some improvement there is no drainage. 11-07-2018 patient seen today for follow-up and management of left elbow wound. She has completed a total of 11 treatments of the graffix with not much improvement. She has an upcoming appointment with plastic surgery to assist with additional treatment options for the left elbow wound on 11/19/18. There is significant amount surrounding undermining of the wound is 0.9 cm. Currently prescribed methotrexate. Wound is being treated with Indoform and border dressing. No drainage from wound. No fever, chills. or pain. 11/14/2018; the patient's wound is about the same as last week. Still undermining. There is no drainage Objective Constitutional Sitting or standing Blood Pressure is within target range for patient.. Pulse regular and within target range for patient.Marland Kitchen Respirations regular, non-labored and within target range.. Temperature is normal and within the target range for the patient.Marland Kitchen CARMELL, DUPIN ([id number]) appears in no distress. Vitals Time Taken: 11:32 AM, Height: 60 in, Weight: 123 lbs, BMI: 24, Temperature: 98.0 F, Pulse: 67 bpm, Respiratory Rate: 18 breaths/min, Blood Pressure: 141/54 mmHg. General Notes: Wound exam;The patient has a small open area on the tip of the olecranon process of her left elbow. This is improved from when I first saw this. She still has undermining from 6 to 12:00 maximum at 9:00 at 0.5 cm. There is no surrounding erythema and no drainage no evidence of active infection. There is no palpable bone Integumentary (Hair, Skin) Wound #9 status is Open. Original cause of wound was Trauma. The wound is located on the Left Elbow. The wound measures 0.6cm length x 0.5cm width x 0.3cm depth; 0.236cm^2 area and 0.071cm^3 volume. There is Fat Layer (Subcutaneous Tissue) Exposed exposed. There is no tunneling noted, however, there is undermining  starting at 5:00 and ending at 12:00 with a maximum distance of 0.5cm. There is a medium amount of purulent drainage noted. The wound margin is distinct with the outline attached to the wound base. There is small (1-33%) pink granulation within the wound bed. There is a medium (34-66%) amount of necrotic tissue within the wound bed including Adherent Slough. The periwound skin appearance exhibited: Maceration, Erythema. The periwound skin appearance did not exhibit: Callus, Crepitus, Excoriation, Induration, Rash, Scarring, Dry/Scaly, Atrophie Blanche, Cyanosis, Ecchymosis, Hemosiderin Staining, Mottled, Pallor, Rubor. The surrounding wound skin color is noted with erythema which is circumferential. Periwound temperature was noted as No Abnormality. The periwound has tenderness on palpation. 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 Anesthetic (add to Medication List): Wound #9 Left Elbow: Topical Lidocaine 4% cream applied to wound bed prior to debridement (In Clinic Only). Skin Barriers/Peri-Wound Care: Wound #9 Left Elbow: Skin Prep Primary Wound Dressing: Wound #9 Left Elbow: Other: - Endoform Secondary Dressing: Wound #9 Left Elbow: Boardered Foam Dressing Dressing Change Frequency: Wound #9 Left Elbow: SANJUANITA, HOFFMEYER ([id number]) Other: - Change dressing every three days Follow-up Appointments: Wound #9 Left Elbow: Return Appointment  in 1 week. 1. Primary dressing changed to endoform 2. Consider a plastic surgery consult and we talked to her about this Electronic Signature(s) Signed: 01/09/2019 8:31:07 AM By: Baltazar Najjar MD Entered By: Baltazar Najjar on 11/28/2018 17:04:30 [name]  ([id number]) -------------------------------------------------------------------------------- SuperBill Details Patient Name: [name] Date of Service: 11/14/2018 Medical Record Number: [id number] Patient Account Number: [id number] Date of Birth/Sex: [birth year] (84 y.o. 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

## 2018-11-24 LAB — AEROBIC CULTURE  (SUPERFICIAL SPECIMEN): GRAM STAIN: NONE SEEN

## 2018-11-24 LAB — AEROBIC CULTURE W GRAM STAIN (SUPERFICIAL SPECIMEN)

## 2018-11-28 ENCOUNTER — Encounter: Payer: Medicare Other | Admitting: Internal Medicine

## 2018-11-28 ENCOUNTER — Ambulatory Visit: Payer: Medicare Other | Attending: Neurology

## 2018-11-28 DIAGNOSIS — R2681 Unsteadiness on feet: Secondary | ICD-10-CM | POA: Insufficient documentation

## 2018-11-28 DIAGNOSIS — G8929 Other chronic pain: Secondary | ICD-10-CM | POA: Diagnosis present

## 2018-11-28 DIAGNOSIS — Z9181 History of falling: Secondary | ICD-10-CM | POA: Diagnosis present

## 2018-11-28 DIAGNOSIS — L97905 Non-pressure chronic ulcer of unspecified part of unspecified lower leg with muscle involvement without evidence of necrosis: Secondary | ICD-10-CM | POA: Diagnosis not present

## 2018-11-28 DIAGNOSIS — M545 Low back pain: Secondary | ICD-10-CM | POA: Insufficient documentation

## 2018-11-28 DIAGNOSIS — S41102S Unspecified open wound of left upper arm, sequela: Secondary | ICD-10-CM | POA: Insufficient documentation

## 2018-11-28 DIAGNOSIS — R262 Difficulty in walking, not elsewhere classified: Secondary | ICD-10-CM | POA: Insufficient documentation

## 2018-11-28 DIAGNOSIS — M6281 Muscle weakness (generalized): Secondary | ICD-10-CM | POA: Diagnosis present

## 2018-11-28 NOTE — Progress Notes (Addendum)
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 does seem to be smaller and she wonders why we did not start the Prisma dressing sooner since it has made such a big difference in such a short amount of time. I explained that obviously we have to wait for the wound to get to a certain point along his healing path before we can initiate the Prisma otherwise it will not be effective. Therefore once the wound became ILANNA, DEIHL ([id number]) clean it was then time to initiate the Prisma. Nonetheless good news is she is noting excellent improvement she does still have some discomfort but nothing as significant as previously noted. 04/17/18 on evaluation today patient appears to be doing very well and in fact her right lower extremity ulcer has completely healed at this point I'm pleased with this. The left lower extremity ulcer seem to be doing better although she still does have some openings noted the Prisma I think is helping more than the Xeroform was in my pinion. With that being said she still has a lot of healing to do in this regard. 04/27/18 on evaluation today patient appears to be doing very well in regard to her left lower Trinity  ulcers. She has been tolerating the dressing changes without complication. I do have a note from her orthopedic surgeon today and they would like for me to help with treating her left elbow surgery site where she had the bursa removed and this was performed roughly 4 weeks ago according to the note that I reviewed. She has been placed on Bactrim DS by need for her leg wounds this probably helped a little bit with the left elbow surgery site. Obviously I do think this is something we can  try to help her out with. 05/04/18 on evaluation today patient appears to be doing well in regard to her left anterior lower Trinity ulcers. She is making good progress which is great news. Unfortunately her elbow which we are also managing at this point in time has not made as much progress unfortunately. She has been tolerating the dressing changes without complication. She did see Dr. Darleen Crocker earlier today and he states that he's willing to give this three weeks to see if she's making any progress with wound care. However he states that she's really not then he will need to go back in and perform further surgery. Obviously she is trying to avoid surgery if at all possible although I'm not sure if this is going to be possible or least not that quickly. 05/11/18 on evaluation today patient appears to be doing very well in regard to her left lower extremity ulcers. Unfortunately in regard to her elbow this is very slow coming about as far as any improvement is concerned. I do feel like there may be a little bit more granulation noted in the base of the wound but nothing too significant unfortunately. I still can probe bone in the proximal portion of the wound which obviously explain to the patient is not good. She will be having a follow-up with her orthopedic surgeon in the next couple of weeks. In the meantime we are trying to do as much as we can to try to show signs of improvement in healing to avoid the need for any  additional and further surgery. Nonetheless I explained to the patient yet again today I'm not sure if that is going to be feasible or not obviously it's more risk for her to continue to have an open wound with bone exposure then to the back in for additional surgery even though I know she doesn't want to go that route. 05/15/18 on evaluation today patient presents for follow-up concerning her ongoing lower extremity ulcers on the left as well as the left elbow ulcer. She has at this point in time been tolerating the dressing changes without complication. Her left lower extremity ulcer appears to be doing very well. In regard to the left elbow ulcer she actually does seem to have additional granulation today which is good news. I am definitely seeing signs of improvement although obviously this is somewhat slow improvement. Nonetheless I'm hopeful we will be able to avoid her having to have any further surgery but again that would definitely be a conversation between herself as well as her surgeon once he sees her for reevaluation. Otherwise she does want to see about having a three order compression stockings for her today 05/21/18 on evaluation today patient appears to be doing well in regard to her left lower surety ulcer. This is almost completely healed and seems to be progressing very nicely. With that being said her left elbow is another story. I'm not really convinced in the past three weeks we've seen a significant improvement in this wound. With that being said if this is something that there is no surgical option for him we have to continue to work on this from the standpoint of conservative management with wound care she may make improvement given time. Nonetheless it appears that her surgeon is somewhat concerned about the possibility of infection and really is leaning towards additional surgery to try and help close this wound. Nonetheless the patient is still unsure of exactly what to  do. 05/29/18 on evaluation  today patient appears to be doing well in regard to her left lower extremity ulcer. She's been tolerating the dressing changes without complication which is good news. With that being said she's been having issues specifically with her elbow she did see her surgeon Dr. Joice Lofts and he is recommending a repeat surgery to the left elbow in order to correct the issue. The patient is still somewhat unsure of this but feels like this may be better than trying to take time to let this heal over a longer period of time through normal wound care measures. Again I explained that I agree this may be a faster way to go if her surgeon feels that this is indeed a good direction to take. Obviously only he can make the judgment on whether or not the surgery would likely be successful. 06/04/18 on evaluation today patient actually presents for follow-up concerning her left elbow and left lower from the ulcer she seems to be doing very well at this point in time. She has been tolerating the dressing changes without complication. With that being said her elbow is not significantly better she actually is scheduled for surgery tomorrow. 07/04/18; the patient had an area on her left leg that is remaining closed. The open area she has now is a postsurgical wound on the left elbow. I think we have clearance from the surgeon to see this now. [address], [place] V[place] Kitchen ([id number]) 07/11/18; we're currently dealing with a surgical wound on the left olecranon process. The patient complains of a lot of pain and drainage. When I saw her last week we did an x-ray that showed soft tissue wound and probable elbow joint effusion but no erosion to suggest osteomyelitis. The culture I did of this was somewhat surprisingly negative. She has a small open wound with not a viable surface there is considerable undermining relative to the wound size. She is on methotrexate for rheumatoid arthritis/overlap  syndrome also plaquenil. We've been using silver collagen 07/18/18-She is seen in follow-up evaluation for a left elbow wound. There is essentially no change. She is currently on Zithromax and will complete that on Friday, there is no indication to extend this. We will change to iodosorb/iodoflex and monitor for response 07/25/18-She is seen in follow-up evaluation for left elbow wound. The wound is stable with no overt evidence of infection. She has counseled with her rheumatologist. She is wanting to restart her methotrexate; a culture was obtained to rule out occult infection before starting her methotrexate. We will continue with Iodosorb/Iodoflex and she will follow-up next week. 08/01/18; this is a difficult wound over her left olecranon process. There is been concerned about infection although cultures including one done last week were negative. Pending 3 weeks ago I gave her an empiric course of antibiotics. She is having a lot of rheumatologic pain in her hands with pain and stiffness. She wants to go on her weekly methotrexate and I think it would be reasonable to do so. We have been using Iodoflex 08/01/18; difficult wound over her left olecranon process. She started back on methotrexate last week because of rheumatologic pain in her hands. We have been using Iodoflex to try and clean out the wound bed. She has been approved for Graphix PL 08/15/18; 2 week follow-up. Difficult wound over her left olecranon process. Graphix PL #1 with collagen backing 08/22/18; one-week follow-up. Difficult wound over her left olecranon process. Graphix PL #2 08/29/18; no major improvement. Difficult wound over her left olecranon process.  Still considerable undermining. Graphics PL #3 1 week follow-up. Graphix #4 09/12/18 graphics #5. Some improvement in wound area although the undermining superiorly still has not closed down as much as I would like 09/19/18; Graphix #6 I think there is improvement in the  undermining from 7 to 9:00. Wound bed looks healthy. 09/26/18 Graffix #7 undermining is 0.5 cm maximally at roughly 8:00. From 12 to 7:00 the tissue is adherent which is a major improvement there is some advancing skin from this side. 10/03/18; Graphix #8 no major changes from last week 10/10/18 Graffix #9 There are improvements. There appears to be granulation coming up to the surface here and there is a lot less undermining at 8:00. 10/17/18. Graffix #10; Dimensions are improved less undermining surface felt the but the wound is still open. Initially a surgical wound following a bursectomy 10/24/18; Graffix #11. This is really stalled over the last 2 weeks. If there is no further improvement this will be the last application.The final option for this difficult area would be plastic surgery and will set up a consult with Dr. Marina Goodell in [facility] 10/31/18; wound looks about the same. The undermining superiorly is 0.7 cm. On the lateral edges perhaps some improvement there is no drainage. 11-07-2018 patient seen today for follow-up and management of left elbow wound. She has completed a total of 11 treatments of the graffix with not much improvement. She has an upcoming appointment with plastic surgery to assist with additional treatment options for the left elbow wound on 11/19/18. There is significant amount surrounding undermining of the wound is 0.9 cm. Currently prescribed methotrexate. Wound is being treated with Indoform and border dressing. No drainage from wound. No fever, chills. or pain. 11/21/18; the patient continues to have the wound looking roughly the same with undermining from about 12 to 6:00. This has not changed all that much. She does have skin irritation around the wound that looks like drainage maceration issues. The patient states that she was not able to have her wound dressing changed because of illness in the person he usually does this. She also did not attend her  clinic appointment today with Dr. Marina Goodell because of transportation issues. She is rebooked for some time in mid January 06/28/2018; the patient has less undermining using endoform. As a understandings she saw Dr. Thad Ranger who is Dr. Lonni Fix partner. He recommended putting her in a elbow brace and I believe is written a prescription for it. He also recommended Motrin 800 mg 3 times daily. This is prescription strength ibuprofen although he did not write his prescription. This apparently was for 2 weeks. Culture I did last time grew a few methicillin sensitive staph aureus. After some difficulty due to drug intolerances/allergies and drug interactions I settled on a 5-day course of azithromycin BROOKLEY, OSIKA ([id number]) Objective Constitutional Vitals Time Taken: 11:20 AM, Height: 60 in, Weight: 123 lbs, BMI: 24, Temperature: 98.1 F, Pulse: 66 bpm, Respiratory Rate: 18 breaths/min, Blood Pressure: 134/51 mmHg. Integumentary (Hair, Skin) Wound #9 status is Open. Original cause of wound was Trauma. The wound is located on the Left Elbow. The wound measures 0.6cm length x 0.5cm width x 0.3cm depth; 0.236cm^2 area and 0.071cm^3 volume. There is Fat Layer (Subcutaneous Tissue) Exposed exposed. There is no tunneling noted, however, there is undermining starting at 4:00 and ending at 11:00 with a maximum distance of 0.4cm. There is a medium amount of purulent drainage noted. The wound margin is distinct with the outline attached to the wound  base. There is no granulation within the wound bed. There is no necrotic tissue within the wound bed. The periwound skin appearance exhibited: Maceration, Erythema. The periwound skin appearance did not exhibit: Callus, Crepitus, Excoriation, Induration, Rash, Scarring, Dry/Scaly, Atrophie Blanche, Cyanosis, Ecchymosis, Hemosiderin Staining, Mottled, Pallor, Rubor. The surrounding wound skin color is noted with erythema which is circumferential. Periwound  temperature was noted as No Abnormality. The periwound has tenderness on palpation. 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 Anesthetic (add to Medication List): Wound #9 Left Elbow: Topical Lidocaine 4% cream applied to wound bed prior to debridement (In Clinic Only). Skin Barriers/Peri-Wound Care: Wound #9 Left Elbow: Skin Prep Primary Wound Dressing: Wound #9 Left Elbow: Other: - Endoform Secondary Dressing: Wound #9 Left Elbow: Boardered Foam Dressing Dressing Change Frequency: Wound #9 Left Elbow: Change dressing every other day. Follow-up Appointments: CLAUDELL, RHODY ([id number]) Wound #9 Left Elbow: Return Appointment in 3 weeks. The following medication(s) was prescribed: azithromycin oral 250 mg tablet 2 tablets oral on day 1 then 1 tablet orally for 4 additional days for wound infection starting 11/28/2018 1. I am going to continue with the endoform. 2. Brace for the elbow was suggested but I am not sure she understands how to get this arranged and I am not going to be able to help her 3. Azithromycin for 5 days given for the MSSA cultured last time 4. She had a note suggesting ibuprofen 800 3 times daily for 2 weeks. I am not going to give this to an 84 year old woman. I advised her to contact Dr. Thad Ranger. This is more I think than the over-the-counter dose which is 200 mg/tab. Electronic Signature(s) Signed: 11/28/2018 5:55:34 PM By: Baltazar Najjar MD Entered By: Baltazar Najjar on 11/28/2018 14:59:09 [name] ([id number]) -------------------------------------------------------------------------------- SuperBill Details Patient Name: [name] Date of Service: 11/28/2018 Medical Record Number: [id number] Patient Account Number:  [id number] Date of Birth/Sex: 16-Apr-1930 (84 y.o. 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

## 2018-11-28 NOTE — Therapy (Signed)
[facility] [address], [place], [address] Phone: [phone]   Fax:  [phone]  Physical Therapy Treatment  Patient Details  Name: [name] MRN: [id number] Date of Birth: [birth year] Referring Provider (PT): Jennings Books, MD   Encounter Date: 11/28/2018  PT End of Session - 11/28/18 1401    Visit Number  16    Number of Visits  33    Date for PT Re-Evaluation  12/27/18    Authorization Type  7    Authorization Time Period  of 10 progress report    PT Start Time  6222   pt arrived late   PT Stop Time  1428    PT Time Calculation (min)  26 min    Equipment Utilized During Treatment  Gait belt    Activity Tolerance  Patient tolerated treatment well    Behavior During Therapy  WFL for tasks assessed/performed       Past Medical History:  Diagnosis Date  . Anemia   . GERD (gastroesophageal reflux disease)   . Hypertension   . Peripheral vascular disease (HCC)    possible neuropathies in lower extremeties  . PONV (postoperative nausea and vomiting)    happens sometimes but better with pre med of zofran  . Presence of permanent cardiac pacemaker   . Syncope     Past Surgical History:  Procedure Laterality Date  . ABDOMINAL HYSTERECTOMY  1969  . APPENDECTOMY  1969   with hysterectomy  . BACK SURGERY  2001   rods in back. surgery on back x 3  . COLONOSCOPY    . I&D EXTREMITY Left 03/27/2018   Procedure: IRRIGATION AND DEBRIDEMENT LEFT ELBOW / OLECRANON BURSA;  Surgeon: Corky Mull, MD;  Location: [facility];  Service: Orthopedics;  Laterality: Left;  . I&D EXTREMITY Left 06/05/2018   Procedure: IRRIGATION AND DEBRIDEMENT EXTREMITY;  Surgeon: Corky Mull, MD;  Location: [facility];  Service: Orthopedics;  Laterality: Left;  . INSERT / REPLACE / REMOVE PACEMAKER  2004  . JOINT REPLACEMENT Left 2004   partial knee replacement  . OLECRANON BURSECTOMY Left 03/27/2018   Procedure: LEFT OLECRANON  BURSA;  Surgeon: Corky Mull, MD;  Location: [facility];  Service: Orthopedics;  Laterality: Left;  . PACEMAKER INSERTION  2014   dual chamber for complete heart block    There were no vitals filed for this visit.  Subjective Assessment - 11/28/18 1404    Subjective  Saw the plastic surgeon yesterday. Needing a splint for [name] to immobilize it to help it heal. Has been doing her exercises at home.     Pertinent History  Imparied gait. Pt states that she has had balance issues for too long. Feels more comfortable walking with her rw. Had tests which showed that she has neuropathy. However the doctor at [facility] said that her balance is coming from her back. Just wants to be strong so she can walk.  Started walking with her rw off and on for the past several months.  Also feels like she might need a new sleep number bed. Also had 2 wound surgeries at [name]. Pt fell in February 2019. Had to stop PT due to the L elbow surgery April 06, 2018 to remove a bursa. The wound still has not healed.   Currently sees a wound doctor.   Feels numbness L foot from the ankle down.  No R foot numbness.  Sometimes unsure of where her L LE is in space.  Pt also states that she tires too quickly.  Has not been able to walk her dog in 2 years. Has not really been doing exercises at home except the sit <> stand. Pt states that prolonged sitting without back support will increase back pain     Limitations  Standing;Walking    How long can you sit comfortably?  1 hr    How long can you stand comfortably?  10 minutes    How long can you walk comfortably?  2 or 3 blocks    Diagnostic tests  EMG testing BLEs and BUEs which were negative    Patient Stated Goals  Improve LE strength. Wants to use the machines at the gym. Walk more steadily. Be able to take her dog (16 lbs) out on a leash.     Currently in Pain?  No/denies    Pain Score  0-No pain    Pain Onset  More than a month ago                                PT Education - 11/28/18 1415    Education provided  Yes    Education Details  ther-ex    [employer]) Educated  Patient    Methods  Explanation;Demonstration;Tactile cues;Verbal cues    Comprehension  Returned demonstration;Verbalized understanding      Objectives  Pt ambulating with rw  TTP bilateral greater trochanter L >R   L elbow wound  No latex band allergies   MedbridgeAccess Code: [email or web address]   Ther-ex  CGAto SBAwith standing activities.  Seated manually resisted trunk flexion isometrics at neutral 10x2 with 5 second holds. Slight R low back discomfort.   Seated manually resisted trunk extension isometrics in neutral 10x5 seconds for 3 sets. No R low back discomfort.   Standing hip abduction with B UE assist 2 lbs each LE 10x3  Forward step up onto 4 inch step with B UE assist  R LE 10x3  L LE 10x3  Gait with SPC x 100 ft x 2 CGA   Pt rested after each 100 ft secondary to fatigue   Improved exercise technique, movement at target joints, use of target muscles after min to mod verbal, visual, tactile cues.    Pt arrived late and session was adjusted accordingly. Continued working on trunk and glute med strengthening to help decrease stress to low back when performing standing activities.  Cues needed to increase step length when ambulating with her SPC. Pt demonstrates tendency to shuffle feet when walking with SPC in which fear of falling as well as fatigue may play a factor. Pt tolerated session well without aggravation of symptoms. Pt will benefit from continued skilled physical therapy services to improve strength, balance, and function.       PT Short Term Goals - 10/18/18 1850      PT SHORT TERM GOAL #1   Title  Patient will be independent with her HEP to improve strength and balance.     Time  3    Period  Weeks    Status  On-going    Target Date  09/27/18        PT Long Term  Goals - 10/29/18 1643      PT LONG TERM GOAL #1   Title  Patient will improve her 10 MWT to 0.8 m/s or more with rw  to promote better community ambulation.     Baseline  0.5 seconds with rw (09/06/2018); 0.63 seconds average with rw (10/18/2018)    Time  8    Period  Weeks    Status  On-going    Target Date  12/27/18      PT LONG TERM GOAL #2   Title  Patient will improve her DGI score using rw or least restrictive AD to 19/24 or more to promote balance.     Baseline  13/24 using rw (09/06/2018); 14/24 using rw (10/29/2018)    Time  8    Period  Weeks    Status  On-going    Target Date  12/27/18      PT LONG TERM GOAL #3   Title  Patient will improve bilateral LE strength by at least 1/2 MMT to promote ability to support herself when performing standing tasks and improve balance.     Time  8    Period  Weeks    Status  On-going      PT LONG TERM GOAL #4   Title  Patient will have a decrease in back pain/ache to 5/10 or less to promote ability to perform standing tasks with less difficulty.    Baseline  8/10 at most for the past 3 months (09/06/2018); 5-6/10 at worst for the past 7 days (10/18/2018)    Time  8    Period  Weeks    Status  Partially Met            Plan - 11/28/18 1415    Clinical Impression Statement  Pt arrived late and session was adjusted accordingly. Continued working on trunk and glute med strengthening to help decrease stress to low back when performing standing activities.  Cues needed to increase step length when ambulating with her SPC. Pt demonstrates tendency to shuffle feet when walking with SPC in which fear of falling as well as fatigue may play a factor. Pt tolerated session well without aggravation of symptoms. Pt will benefit from continued skilled physical therapy services to improve strength, balance, and function.     Rehab Potential  Fair    Clinical Impairments Affecting Rehab Potential  (-) chronicity of condition, age, comorbidities; (+)  motivated    PT Frequency  2x / week    PT Duration  8 weeks    PT Treatment/Interventions  Aquatic Therapy;Gait training;Therapeutic activities;Therapeutic exercise;Balance training;Neuromuscular re-education;Patient/family education;Manual techniques;Dry needling    PT Next Visit Plan  trunk, hip, scapular strengthening, balance, gait, manual techniques PRN    Consulted and Agree with Plan of Care  Patient       Patient will benefit from skilled therapeutic intervention in order to improve the following deficits and impairments:  Abnormal gait, Decreased activity tolerance, Decreased balance, Decreased endurance, Decreased range of motion, Decreased strength, Difficulty walking, Improper body mechanics, Postural dysfunction, Pain, Decreased knowledge of use of DME  Visit Diagnosis: Unsteadiness on feet  History of falling  Muscle weakness (generalized)  Difficulty in walking, not elsewhere classified  Chronic low back pain, unspecified back pain laterality, unspecified whether sciatica present     Problem List Patient Active Problem List   Diagnosis Date Noted  . Septic olecranon bursitis of left elbow 03/27/2018  . Lymphedema 10/11/2017  . Chronic venous insufficiency 10/11/2017  . Leg pain 10/11/2017  . Leg swelling 10/11/2017  . COPD (chronic obstructive pulmonary disease) ([place]) 10/11/2017  . Essential hypertension 10/11/2017    Joneen Boers PT, DPT  11/28/2018, 3:56 PM  [facility] [address], [place], [address] Phone: [phone]   Fax:  [phone]  Name: KENSLEI HEARTY MRN: [id number] Date of Birth: 21-Aug-1930

## 2018-11-29 NOTE — Progress Notes (Signed)
SORANGEL, LOPRESTI ([id number]) Visit Report for 11/28/2018 Arrival Information Details Patient Name: [name], [name] Date of Service: 11/28/2018 11:15 AM Medical Record Number: [id number] Patient Account Number: [id number] Date of Birth/Sex: 09/24/1930 (84 y.o. F) Treating RN: Rema [name] Primary Care Ilah Boule: Aram Beecham Other Clinician: Referring Jeray Shugart: Aram Beecham Treating Edmon Magid/Extender: Altamese [place] in Treatment: 27 Visit Information History Since Last Visit Added or deleted any medications: No Patient Arrived: [place] Any new allergies or adverse reactions: No Arrival Time: 11:19 Had a fall or experienced change in No Accompanied By: self activities of daily living that may affect Transfer Assistance: None risk of falls: Patient Identification Verified: Yes Signs or symptoms of abuse/neglect since last visito No Secondary Verification Process Yes Hospitalized since last visit: No Completed: Implantable device outside of the clinic excluding No Patient Requires Transmission-Based No cellular tissue based products placed in the center Precautions: since last visit: Patient Has Alerts: Yes Has Dressing in Place as Prescribed: Yes Patient Alerts: non compressible left Pain Present Now: No leg Electronic Signature(s) Signed: 11/28/2018 1:44:44 PM By: Rema [name] Entered By: Rema [name] on 11/28/2018 11:20:13 [name] ([id number]) -------------------------------------------------------------------------------- Clinic Level of Care Assessment Details Patient Name: [name] Date of Service: 11/28/2018 11:15 AM Medical Record Number: [id number] Patient Account Number: [id number] Date of Birth/Sex: 1930-07-01 (84 y.o. F) Treating RN: Huel Coventry Primary Care Annice Jolly: Aram Beecham Other Clinician: Referring Baya Lentz: Aram Beecham Treating Sayda Grable/Extender: Altamese [place] in Treatment: 44 Clinic Level of Care Assessment  Items TOOL 4 Quantity Score []  - Use when only an EandM is performed on FOLLOW-UP visit 0 ASSESSMENTS - Nursing Assessment / Reassessment []  - Reassessment of Co-morbidities (includes updates in patient status) 0 X- 1 5 Reassessment of Adherence to Treatment Plan ASSESSMENTS - Wound and Skin Assessment / Reassessment X - Simple Wound Assessment / Reassessment - one wound 1 5 []  - 0 Complex Wound Assessment / Reassessment - multiple wounds []  - 0 Dermatologic / Skin Assessment (not related to wound area) ASSESSMENTS - Focused Assessment []  - Circumferential Edema Measurements - multi extremities 0 []  - 0 Nutritional Assessment / Counseling / Intervention []  - 0 Lower Extremity Assessment (monofilament, tuning fork, pulses) []  - 0 Peripheral Arterial Disease Assessment (using hand held doppler) ASSESSMENTS - Ostomy and/or Continence Assessment and Care []  - Incontinence Assessment and Management 0 []  - 0 Ostomy Care Assessment and Management (repouching, etc.) PROCESS - Coordination of Care X - Simple Patient / Family Education for ongoing care 1 15 []  - 0 Complex (extensive) Patient / Family Education for ongoing care []  - 0 Staff obtains Chiropractor, Records, Test Results / Process Orders []  - 0 Staff telephones HHA, Nursing Homes / Clarify orders / etc []  - 0 Routine Transfer to another Facility (non-emergent condition) []  - 0 Routine Hospital Admission (non-emergent condition) []  - 0 New Admissions / Manufacturing engineer / Ordering NPWT, Apligraf, etc. []  - 0 Emergency Hospital Admission (emergent condition) X- 1 10 Simple Discharge Coordination [name], [name] ([id number]) []  - 0 Complex (extensive) Discharge Coordination PROCESS - Special Needs []  - Pediatric / Minor Patient Management 0 []  - 0 Isolation Patient Management []  - 0 Hearing / Language / Visual special needs []  - 0 Assessment of Community assistance (transportation, D/C planning, etc.) []  -  0 Additional assistance / Altered mentation []  - 0 Support Surface(s) Assessment (bed, cushion, seat, etc.) INTERVENTIONS - Wound Cleansing / Measurement X - Simple Wound Cleansing - one wound 1 5 []  -  0 Complex Wound Cleansing - multiple wounds X- 1 5 Wound Imaging (photographs - any number of wounds) []  - 0 Wound Tracing (instead of photographs) X- 1 5 Simple Wound Measurement - one wound []  - 0 Complex Wound Measurement - multiple wounds INTERVENTIONS - Wound Dressings []  - Small Wound Dressing one or multiple wounds 0 X- 1 15 Medium Wound Dressing one or multiple wounds []  - 0 Large Wound Dressing one or multiple wounds []  - 0 Application of Medications - topical []  - 0 Application of Medications - injection INTERVENTIONS - Miscellaneous []  - External ear exam 0 []  - 0 Specimen Collection (cultures, biopsies, blood, body fluids, etc.) []  - 0 Specimen(s) / Culture(s) sent or taken to Lab for analysis []  - 0 Patient Transfer (multiple staff / / Similar devices) []  - 0 Simple Staple / Suture removal (25 or less) []  - 0 Complex Staple / Suture removal (26 or more) []  - 0 Hypo / Hyperglycemic Management (close monitor of Blood Glucose) []  - 0 Ankle / Brachial Index (ABI) - do not check if billed separately X- 1 5 Vital Signs [name], [name] ( ) Has the patient been seen at the hospital within the last three years: Yes Total Score: 70 Level Of Care: New/Established - Level 2 Electronic Signature(s) Signed: 11/28/2018 5:32:05 PM By: , BSN, RN, CWS, Kim RN, BSN Entered By: , BSN, RN, CWS, Kim on 11/28/2018 11:39:01 ( ) -------------------------------------------------------------------------------- Lower Extremity Assessment Details Patient Name: Date of Service: 11/28/2018 11:15 AM Medical Record Number: Patient Account Number: Date of Birth/Sex: 1930-02-20 (84 y.o.  F) Treating RN: [name] Primary Care Lashuna Tamashiro: [id number] Other Clinician: Referring Jacinda Kanady: 11/30/2018 Treating Jaymes Revels/Extender: Elliot Gurney in Treatment: 67 Electronic Signature(s) Signed: 11/28/2018 1:44:44 PM By: [name] Entered By: [id number] on 11/28/2018 11:28:57 11/30/2018 ([id number]) -------------------------------------------------------------------------------- Multi Wound Chart Details Patient Name: [id number] Date of Service: 11/28/2018 11:15 AM Medical Record Number: [id number] Patient Account Number: Rema [name] Date of Birth/Sex: 04/02/1930 (84 y.o. F) Treating RN: Altamese [place] Primary Care Herson Prichard: 57 Other Clinician: Referring Kenai Fluegel: 11/30/2018 Treating Genese Quebedeaux/Extender: Rema [name] in Treatment: 49 Vital Signs Height(in): 60 Pulse(bpm): 66 Weight(lbs): 123 Blood Pressure(mmHg): 134/51 Body Mass Index(BMI): 24 Temperature(F): 98.1 Respiratory Rate 18 (breaths/min): Photos: [N/A:N/A] Wound Location: Left Elbow N/A N/A Wounding Event: Trauma N/A N/A Primary Etiology: Trauma, Other N/A N/A Comorbid History: Cataracts, Glaucoma, Optic N/A N/A Neuritis, Chronic sinus problems/congestion, Middle ear problems Date Acquired: 01/26/2018 N/A N/A Weeks of Treatment: 30 N/A N/A Wound Status: Open N/A N/A Measurements L x W x D 0.6x0.5x0.3 N/A N/A (cm) Area (cm) : 0.236 N/A N/A Volume (cm) : 0.071 N/A N/A % Reduction in Area: -232.40% N/A N/A % Reduction in Volume: -407.10% N/A N/A Starting Position 1 4 (o'clock): Ending Position 1 11 (o'clock): Maximum Distance 1 (cm): 0.4 Undermining: Yes N/A N/A Classification: Full Thickness With Exposed N/A N/A Support Structures Exudate Amount: Medium N/A N/A Exudate Type: Purulent N/A N/A Exudate Color: yellow, brown, green N/A N/A Wound Margin: Distinct, outline attached N/A N/A Granulation Amount: None Present (0%) N/A N/A [name], [name] ([id number]) Necrotic Amount: None Present (0%) N/A N/A Exposed Structures: Fat Layer (Subcutaneous N/A N/A Tissue) Exposed: Yes Fascia: No Tendon: No Muscle: No Joint: No Bone: No Epithelialization: None N/A N/A Periwound Skin Texture: Excoriation: No N/A N/A Induration: No Callus: No Crepitus: No Rash: No Scarring: No Periwound  Skin Moisture: Maceration: Yes N/A N/A Dry/Scaly: No Periwound Skin Color: Erythema: Yes N/A N/A Atrophie Blanche: No Cyanosis: No Ecchymosis: No Hemosiderin Staining: No Mottled: No Pallor: No Rubor: No Erythema Location: Circumferential N/A N/A Temperature: No Abnormality N/A N/A Tenderness on Palpation: Yes N/A N/A Wound Preparation: Ulcer Cleansing: N/A N/A Rinsed/Irrigated with Saline Topical Anesthetic Applied: Other: lidocaine 4% Treatment Notes Electronic Signature(s) Signed: 11/28/2018 5:55:34 PM By: Baltazar Najjar MD Entered By: Baltazar Najjar on 11/28/2018 14:47:48 [name] ([id number]) -------------------------------------------------------------------------------- Multi-Disciplinary Care Plan Details Patient Name: [name] Date of Service: 11/28/2018 11:15 AM Medical Record Number: [id number] Patient Account Number: [id number] Date of Birth/Sex: 1930-11-17 (84 y.o. F) Treating RN: Huel Coventry Primary Care Cozetta Seif: Aram Beecham Other Clinician: Referring Jairus Tonne: Aram Beecham Treating Devory Mckinzie/Extender: Altamese [place] in Treatment: 15 Active Inactive Soft Tissue Infection Nursing Diagnoses: Impaired tissue integrity Goals: Patient's soft tissue infection will resolve Date Initiated: 07/18/2018 Target Resolution Date: 07/27/2018 Goal Status: Active Interventions: Assess signs and symptoms of infection every visit Treatment Activities: Culture and sensitivity : 07/18/2018 Systemic antibiotics : 07/18/2018 Notes: Wound/Skin Impairment Nursing Diagnoses: Impaired tissue  integrity Knowledge deficit related to ulceration/compromised skin integrity Goals: Patient/caregiver will verbalize understanding of skin care regimen Date Initiated: 11/14/2017 Target Resolution Date: 11/28/2017 Goal Status: Active Ulcer/skin breakdown will have a volume reduction of 30% by week 4 Date Initiated: 11/14/2017 Target Resolution Date: 11/28/2017 Goal Status: Active Interventions: Assess patient/caregiver ability to obtain necessary supplies Assess patient/caregiver ability to perform ulcer/skin care regimen upon admission and as needed Assess ulceration(s) every visit Treatment Activities: Skin care regimen initiated : 11/14/2017 [name], [name] ([id number]) Notes: Electronic Signature(s) Signed: 11/28/2018 5:32:05 PM By: Elliot Gurney, BSN, RN, CWS, Kim RN, BSN Entered By: Elliot Gurney, BSN, RN, CWS, Kim on 11/28/2018 11:33:44 [name] ([id number]) -------------------------------------------------------------------------------- Pain Assessment Details Patient Name: [name] Date of Service: 11/28/2018 11:15 AM Medical Record Number: [id number] Patient Account Number: [id number] Date of Birth/Sex: 1930-08-07 (84 y.o. F) Treating RN: Rema [name] Primary Care Malayjah Otoole: Aram Beecham Other Clinician: Referring Syniyah Bourne: Aram Beecham Treating Quenisha Lovins/Extender: Altamese [place] in Treatment: 97 Active Problems Location of Pain Severity and Description of Pain Patient Has Paino No Site Locations Pain Management and Medication Current Pain Management: Goals for Pain Management pt denies any pain at this time. Electronic Signature(s) Signed: 11/28/2018 1:44:44 PM By: Rema [name] Entered By: Rema [name] on 11/28/2018 11:20:34 [name] ([id number]) -------------------------------------------------------------------------------- Patient/Caregiver Education Details Patient Name: [name] Date of Service: 11/28/2018 11:15 AM Medical Record Number:  [id number] Patient Account Number: [id number] Date of Birth/Gender: [birth year] (84 y.o. F) Treating RN: Huel Coventry Primary Care Physician: Aram Beecham Other Clinician: Referring Physician: Aram Beecham Treating Physician/Extender: Altamese [place] in Treatment: 77 Education Assessment Education Provided To: Patient Education Topics Provided Wound/Skin Impairment: Handouts: Caring for Your Ulcer Methods: Demonstration, Explain/Verbal Responses: State content correctly Electronic Signature(s) Signed: 11/28/2018 5:32:05 PM By: Elliot Gurney, BSN, RN, CWS, Kim RN, BSN Entered By: Elliot Gurney, BSN, RN, CWS, Kim on 11/28/2018 11:39:17 [name] ([id number]) -------------------------------------------------------------------------------- Wound Assessment Details Patient Name: [name] Date of Service: 11/28/2018 11:15 AM Medical Record Number: [id number] Patient Account Number: [id number] Date of Birth/Sex: 1930/06/28 (84 y.o. F) Treating RN: Rema [name] Primary Care Kimmie Berggren: Aram Beecham Other Clinician: Referring Graci Hulce: Aram Beecham Treating Shandricka Monroy/Extender: Altamese [place] in Treatment: 54 Wound Status Wound Number: 9 Primary Trauma, Other Etiology: Wound Location: Left Elbow Wound Open Wounding Event: Trauma Status: Date Acquired: 01/26/2018  Comorbid Cataracts, Glaucoma, Optic Neuritis, Chronic Weeks Of Treatment: 30 History: sinus problems/congestion, Middle ear problems Clustered Wound: No Photos Photo Uploaded By: Rema [name] on 11/28/2018 12:58:48 Wound Measurements Length: (cm) 0.6 Width: (cm) 0.5 Depth: (cm) 0.3 Area: (cm) 0.236 Volume: (cm) 0.071 % Reduction in Area: -232.4% % Reduction in Volume: -407.1% Epithelialization: None Tunneling: No Undermining: Yes Starting Position (o'clock): 4 Ending Position (o'clock): 11 Maximum Distance: (cm) 0.4 Wound Description Full Thickness With Exposed Support Foul  Odo Classification: Structures Slough/F Wound Margin: Distinct, outline attached Exudate Medium Amount: Exudate Type: Purulent Exudate Color: yellow, brown, green r After Cleansing: No ibrino No Wound Bed Granulation Amount: None Present (0%) Exposed Structure Necrotic Amount: None Present (0%) Fascia Exposed: No Fat Layer (Subcutaneous Tissue) Exposed: Yes [name], [name] ([id number]) Tendon Exposed: No Muscle Exposed: No Joint Exposed: No Bone Exposed: No Periwound Skin Texture Texture Color No Abnormalities Noted: No No Abnormalities Noted: No Callus: No Atrophie Blanche: No Crepitus: No Cyanosis: No Excoriation: No Ecchymosis: No Induration: No Erythema: Yes Rash: No Erythema Location: Circumferential Scarring: No Hemosiderin Staining: No Mottled: No Moisture Pallor: No No Abnormalities Noted: No Rubor: No Dry / Scaly: No Maceration: Yes Temperature / Pain Temperature: No Abnormality Tenderness on Palpation: Yes Wound Preparation Ulcer Cleansing: Rinsed/Irrigated with Saline Topical Anesthetic Applied: Other: lidocaine 4%, Electronic Signature(s) Signed: 11/28/2018 1:44:44 PM By: Rema [name] Entered By: Rema [name] on 11/28/2018 11:28:48 [name] ([id number]) -------------------------------------------------------------------------------- Vitals Details Patient Name: [name] Date of Service: 11/28/2018 11:15 AM Medical Record Number: [id number] Patient Account Number: [id number] Date of Birth/Sex: [birth year] (84 y.o. F) Treating RN: Rema [name] Primary Care [place]: Aram Beecham Other Clinician: Referring Jigar Zielke: Aram Beecham Treating Hussien Greenblatt/Extender: Altamese [place] in Treatment: 67 Vital Signs Time Taken: 11:20 Temperature (F): 98.1 Height (in): 60 Pulse (bpm): 66 Weight (lbs): 123 Respiratory Rate (breaths/min): 18 Body Mass Index (BMI): 24 Blood Pressure (mmHg): 134/51 Reference Range: 80 - 120 mg /  dl Electronic Signature(s) Signed: 11/28/2018 1:44:44 PM By: Rema [name] Entered ByRema [name] on 11/28/2018 11:24:14

## 2018-12-03 ENCOUNTER — Encounter: Payer: Medicare Other | Admitting: Physician Assistant

## 2018-12-03 DIAGNOSIS — L97905 Non-pressure chronic ulcer of unspecified part of unspecified lower leg with muscle involvement without evidence of necrosis: Secondary | ICD-10-CM | POA: Diagnosis not present

## 2018-12-06 ENCOUNTER — Ambulatory Visit: Payer: Medicare Other

## 2018-12-06 DIAGNOSIS — M545 Low back pain, unspecified: Secondary | ICD-10-CM

## 2018-12-06 DIAGNOSIS — R262 Difficulty in walking, not elsewhere classified: Secondary | ICD-10-CM

## 2018-12-06 DIAGNOSIS — R2681 Unsteadiness on feet: Secondary | ICD-10-CM | POA: Diagnosis not present

## 2018-12-06 DIAGNOSIS — M6281 Muscle weakness (generalized): Secondary | ICD-10-CM

## 2018-12-06 DIAGNOSIS — Z9181 History of falling: Secondary | ICD-10-CM

## 2018-12-06 DIAGNOSIS — G8929 Other chronic pain: Secondary | ICD-10-CM

## 2018-12-06 NOTE — Therapy (Signed)
[facility] [address] [place], [address] Phone: [phone]   Fax:  [phone]  Physical Therapy Treatment  Patient Details  Name: [name] MRN: [id number] Date of Birth: 03/20/1930 Referring Provider (PT): Jennings Books, MD   Encounter Date: 12/06/2018  PT End of Session - 12/06/18 1550    Visit Number  17    Number of Visits  33    Date for PT Re-Evaluation  12/27/18    Authorization Type  8    Authorization Time Period  of 10 progress report    PT Start Time  6226    PT Stop Time  1637    PT Time Calculation (min)  47 min    Equipment Utilized During Treatment  Gait belt    Activity Tolerance  Patient tolerated treatment well    Behavior During Therapy  [facility] for tasks assessed/performed       Past Medical History:  Diagnosis Date  . Anemia   . GERD (gastroesophageal reflux disease)   . Hypertension   . Peripheral vascular disease (HCC)    possible neuropathies in lower extremeties  . PONV (postoperative nausea and vomiting)    happens sometimes but better with pre med of zofran  . Presence of permanent cardiac pacemaker   . Syncope     Past Surgical History:  Procedure Laterality Date  . ABDOMINAL HYSTERECTOMY  1969  . APPENDECTOMY  1969   with hysterectomy  . BACK SURGERY  2001   rods in back. surgery on back x 3  . COLONOSCOPY    . I&D EXTREMITY Left 03/27/2018   Procedure: IRRIGATION AND DEBRIDEMENT LEFT ELBOW / OLECRANON BURSA;  Surgeon: Corky Mull, MD;  Location: [facility];  Service: Orthopedics;  Laterality: Left;  . I&D EXTREMITY Left 06/05/2018   Procedure: IRRIGATION AND DEBRIDEMENT EXTREMITY;  Surgeon: Corky Mull, MD;  Location: [facility] OR[facility];  Service: Orthopedics;  Laterality: Left;  . INSERT / REPLACE / REMOVE PACEMAKER  2004  . JOINT REPLACEMENT Left 2004   partial knee replacement  . OLECRANON BURSECTOMY Left 03/27/2018   Procedure: LEFT OLECRANON BURSA;  Surgeon:  Corky Mull, MD;  Location: [facility];  Service: Orthopedics;  Laterality: Left;  . PACEMAKER INSERTION  2014   dual chamber for complete heart block    There were no vitals filed for this visit.  Subjective Assessment - 12/06/18 1554    Subjective  No back pain currently. Sees Maureen on Monday to get a splint for [name].     Pertinent History  Imparied gait. Pt states that she has had balance issues for too long. Feels more comfortable walking with her rw. Had tests which showed that she has neuropathy. However the doctor at [facility] said that her balance is coming from her back. Just wants to be strong so she can walk.  Started walking with her rw off and on for the past several months.  Also feels like she might need a new sleep number bed. Also had 2 wound surgeries at [name]. Pt fell in February 2019. Had to stop PT due to the L elbow surgery April 06, 2018 to remove a bursa. The wound still has not healed.   Currently sees a wound doctor.   Feels numbness L foot from the ankle down.  No R foot numbness.  Sometimes unsure of where her L LE is in space.  Pt also  states that she tires too quickly.  Has not been able to walk her dog in 2 years. Has not really been doing exercises at home except the sit <> stand. Pt states that prolonged sitting without back support will increase back pain     Limitations  Standing;Walking    How long can you sit comfortably?  1 hr    How long can you stand comfortably?  10 minutes    How long can you walk comfortably?  2 or 3 blocks    Diagnostic tests  EMG testing BLEs and BUEs which were negative    Patient Stated Goals  Improve LE strength. Wants to use the machines at the gym. Walk more steadily. Be able to take her dog (16 lbs) out on a leash.     Currently in Pain?  No/denies    Pain Score  0-No pain    Pain Onset  More than a month ago         [facility] PT Assessment - 12/06/18 1616      Strength   Right Hip Flexion  4/5    Right Hip  Extension  4/5   seated manually resisted   Right Hip ABduction  4+/5   clamshell isometric   Left Hip Flexion  4/5    Left Hip Extension  4/5   seated manually resisted   Left Hip ABduction  4+/5   clamshell isometric   Right Knee Flexion  4/5    Right Knee Extension  4+/5    Left Knee Flexion  4/5    Left Knee Extension  5/5                           PT Education - 12/06/18 1611    Education provided  Yes    Education Details  ther-ex    Person(s) Educated  Patient    Methods  Explanation;Demonstration;Tactile cues;Verbal cues    Comprehension  Returned demonstration;Verbalized understanding      Objectives  Pt ambulating with rw  TTP bilateral greater trochanter L >R   L elbow wound  No latex band allergies   MedbridgeAccess Code: [email or web address]   Ther-ex  CGAto SBAwith standing activities.  Seated manually resisted trunk flexion isometrics at neutral 10x3 with 5 second holds.    Stepping over shoe box with one UE assist, CGA 10x each LE   Able to clear feet better compared to DGI attempt at 10/29/2018  Gait with SPC CGA 200 ft. No LOB.   Seated manually resisted trunk extension isometrics in neutral 10x5 seconds for 3 sets. No R low back discomfort.   Seated manually resisted hip flexion, hip extension, clamshell isometrics, knee flexion, knee extension 1-2x each way for each LE  Reviewed progress/current status with strength with pt.   Improved glute med muscle strength   Forward step up onto 4 inch step with B UE assist             R LE 10x3             L LE 10x3  Standing hip abduction with B UE assist 2 lbs each LE 10x3  Give as part of HEP if pt able to get weights    Improved exercise technique, movement at target joints, use of target muscles after min to mod verbal, visual, tactile cues.     Improved ability to ambulate with SPC up to 200 ft without rest breaks  as well as without LOB today. Pt also better  able to clear a shoe box obstacle with one UE assist without LOB multiple times today. Unable to perform that obstacle negotiation when initially attempted at her DGI. Continued working on trunk and LE strength, to promote ability to ambulate with least restrictive device. Pt will benefit from continued skilled physical therapy services to improve strength and function.     PT Short Term Goals - 10/18/18 1850      PT SHORT TERM GOAL #1   Title  Patient will be independent with her HEP to improve strength and balance.     Time  3    Period  Weeks    Status  On-going    Target Date  09/27/18        PT Long Term Goals - 10/29/18 1643      PT LONG TERM GOAL #1   Title  Patient will improve her 10 MWT to 0.8 m/s or more with rw to promote better community ambulation.     Baseline  0.5 seconds with rw (09/06/2018); 0.63 seconds average with rw (10/18/2018)    Time  8    Period  Weeks    Status  On-going    Target Date  12/27/18      PT LONG TERM GOAL #2   Title  Patient will improve her DGI score using rw or least restrictive AD to 19/24 or more to promote balance.     Baseline  13/24 using rw (09/06/2018); 14/24 using rw (10/29/2018)    Time  8    Period  Weeks    Status  On-going    Target Date  12/27/18      PT LONG TERM GOAL #3   Title  Patient will improve bilateral LE strength by at least 1/2 MMT to promote ability to support herself when performing standing tasks and improve balance.     Time  8    Period  Weeks    Status  On-going      PT LONG TERM GOAL #4   Title  Patient will have a decrease in back pain/ache to 5/10 or less to promote ability to perform standing tasks with less difficulty.    Baseline  8/10 at most for the past 3 months (09/06/2018); 5-6/10 at worst for the past 7 days (10/18/2018)    Time  8    Period  Weeks    Status  Partially Met            Plan - 12/06/18 1546    Clinical Impression Statement  Improved ability to ambulate with SPC up to 200  ft without rest breaks as well as without LOB today. Pt also better able to clear a shoe box obstacle with one UE assist without LOB multiple times today. Unable to perform that obstacle negotiation when initially attempted at her DGI. Continued working on trunk and LE strength, to promote ability to ambulate with least restrictive device. Pt will benefit from continued skilled physical therapy services to improve strength and function.     Rehab Potential  Fair    Clinical Impairments Affecting Rehab Potential  (-) chronicity of condition, age, comorbidities; (+) motivated    PT Frequency  2x / week    PT Duration  8 weeks    PT Treatment/Interventions  Aquatic Therapy;Gait training;Therapeutic activities;Therapeutic exercise;Balance training;Neuromuscular re-education;Patient/family education;Manual techniques;Dry needling    PT Next Visit Plan  trunk, hip, scapular strengthening, balance,  gait, manual techniques PRN    Consulted and Agree with Plan of Care  Patient       Patient will benefit from skilled therapeutic intervention in order to improve the following deficits and impairments:  Abnormal gait, Decreased activity tolerance, Decreased balance, Decreased endurance, Decreased range of motion, Decreased strength, Difficulty walking, Improper body mechanics, Postural dysfunction, Pain, Decreased knowledge of use of DME  Visit Diagnosis: Unsteadiness on feet  History of falling  Muscle weakness (generalized)  Difficulty in walking, not elsewhere classified  Chronic low back pain, unspecified back pain laterality, unspecified whether sciatica present     Problem List Patient Active Problem List   Diagnosis Date Noted  . Septic olecranon bursitis of left elbow 03/27/2018  . Lymphedema 10/11/2017  . Chronic venous insufficiency 10/11/2017  . Leg pain 10/11/2017  . Leg swelling 10/11/2017  . COPD (chronic obstructive pulmonary disease) ([place]) 10/11/2017  . Essential hypertension  10/11/2017    Joneen Boers PT, DPT   12/06/2018, 4:46 PM  [facility] [address] [place], [address] Phone: [phone]   Fax:  [phone]  Name: [name] MRN: [id number] Date of Birth: 04/23/1930

## 2018-12-07 ENCOUNTER — Ambulatory Visit
Admission: RE | Admit: 2018-12-07 | Discharge: 2018-12-07 | Disposition: A | Discharge status: Home / Self Care | Payer: Medicare Other | Source: Ambulatory Visit | Attending: Internal Medicine | Admitting: Internal Medicine

## 2018-12-07 DIAGNOSIS — Z1231 Encounter for screening mammogram for malignant neoplasm of breast: Secondary | ICD-10-CM | POA: Diagnosis present

## 2018-12-07 NOTE — Progress Notes (Signed)
TRENT, THEISEN ([id number]) Visit Report for 12/03/2018 Arrival Information Details Patient Name: [name], [name] Date of Service: 12/03/2018 10:30 AM Medical Record Number: [id number] Patient Account Number: [id number] Date of Birth/Sex: 06-30-1930 (84 y.o. F) Treating RN: Arnette Norris Primary Care Zayne Draheim: Aram Beecham Other Clinician: Referring Arbie Blankley: Aram Beecham Treating Maiya Kates/Extender: Linwood Dibbles, HOYT Weeks in Treatment: 74 Visit Information History Since Last Visit Added or deleted any medications: No Patient Arrived: Walker Any new allergies or adverse reactions: No Arrival Time: 10:31 Had a fall or experienced change in No Accompanied By: self activities of daily living that may affect Transfer Assistance: None risk of falls: Patient Identification Verified: Yes Signs or symptoms of abuse/neglect since last visito No Secondary Verification Process Yes Hospitalized since last visit: No Completed: Implantable device outside of the clinic excluding No Patient Requires Transmission-Based No cellular tissue based products placed in the center Precautions: since last visit: Patient Has Alerts: Yes Has Dressing in Place as Prescribed: Yes Patient Alerts: non compressible left Pain Present Now: No leg Electronic Signature(s) Signed: 12/03/2018 3:57:44 PM By: Dayton Martes RCP, RRT, CHT Entered By: Dayton Martes on 12/03/2018 10:32:16 [name] ([id number]) -------------------------------------------------------------------------------- Clinic Level of Care Assessment Details Patient Name: [name] Date of Service: 12/03/2018 10:30 AM Medical Record Number: [id number] Patient Account Number: [id number] Date of Birth/Sex: February 02, 1930 (84 y.o. F) Treating RN: Arnette Norris Primary Care Alida Greiner: Aram Beecham Other Clinician: Referring Kassidy Dockendorf: Aram Beecham Treating Britanee Vanblarcom/Extender: Linwood Dibbles,  HOYT Weeks in Treatment: 66 Clinic Level of Care Assessment Items TOOL 4 Quantity Score []  - Use when only an EandM is performed on FOLLOW-UP visit 0 ASSESSMENTS - Nursing Assessment / Reassessment X - Reassessment of Co-morbidities (includes updates in patient status) 1 10 X- 1 5 Reassessment of Adherence to Treatment Plan ASSESSMENTS - Wound and Skin Assessment / Reassessment X - Simple Wound Assessment / Reassessment - one wound 1 5 []  - 0 Complex Wound Assessment / Reassessment - multiple wounds []  - 0 Dermatologic / Skin Assessment (not related to wound area) ASSESSMENTS - Focused Assessment []  - Circumferential Edema Measurements - multi extremities 0 []  - 0 Nutritional Assessment / Counseling / Intervention []  - 0 Lower Extremity Assessment (monofilament, tuning fork, pulses) []  - 0 Peripheral Arterial Disease Assessment (using hand held doppler) ASSESSMENTS - Ostomy and/or Continence Assessment and Care []  - Incontinence Assessment and Management 0 []  - 0 Ostomy Care Assessment and Management (repouching, etc.) PROCESS - Coordination of Care X - Simple Patient / Family Education for ongoing care 1 15 []  - 0 Complex (extensive) Patient / Family Education for ongoing care X- 1 10 Staff obtains , Records, Test Results / Process Orders []  - 0 Staff telephones HHA, Nursing Homes / Clarify orders / etc []  - 0 Routine Transfer to another Facility (non-emergent condition) []  - 0 Routine Hospital Admission (non-emergent condition) []  - 0 New Admissions / / Ordering NPWT, Apligraf, etc. []  - 0 Emergency Hospital Admission (emergent condition) X- 1 10 Simple Discharge Coordination [name], [name] ( ) []  - 0 Complex (extensive) Discharge Coordination PROCESS - Special Needs []  - Pediatric / Minor Patient Management 0 []  - 0 Isolation Patient Management []  - 0 Hearing / Language / Visual special needs []  - 0 Assessment of  Community assistance (transportation, D/C planning, etc.) []  - 0 Additional assistance / Altered mentation []  - 0 Support Surface(s) Assessment (bed, cushion, seat, etc.) INTERVENTIONS - Wound Cleansing / Measurement X - Simple Wound  Cleansing - one wound 1 5 []  - 0 Complex Wound Cleansing - multiple wounds X- 1 5 Wound Imaging (photographs - any number of wounds) []  - 0 Wound Tracing (instead of photographs) X- 1 5 Simple Wound Measurement - one wound []  - 0 Complex Wound Measurement - multiple wounds INTERVENTIONS - Wound Dressings X - Small Wound Dressing one or multiple wounds 1 10 []  - 0 Medium Wound Dressing one or multiple wounds []  - 0 Large Wound Dressing one or multiple wounds []  - 0 Application of Medications - topical []  - 0 Application of Medications - injection INTERVENTIONS - Miscellaneous []  - External ear exam 0 []  - 0 Specimen Collection (cultures, biopsies, blood, body fluids, etc.) []  - 0 Specimen(s) / Culture(s) sent or taken to Lab for analysis []  - 0 Patient Transfer (multiple staff / / Similar devices) []  - 0 Simple Staple / Suture removal (25 or less) []  - 0 Complex Staple / Suture removal (26 or more) []  - 0 Hypo / Hyperglycemic Management (close monitor of Blood Glucose) []  - 0 Ankle / Brachial Index (ABI) - do not check if billed separately X- 1 5 Vital Signs [name], [name] ( ) Has the patient been seen at the hospital within the last three years: Yes Total Score: 85 Level Of Care: New/Established - Level 3 Electronic Signature(s) Signed: 12/04/2018 1:37:43 PM By: Entered By: on 12/03/2018 10:51:00 ( ) -------------------------------------------------------------------------------- Encounter Discharge Information Details Patient Name: Date of Service: 12/03/2018 10:30 AM Medical Record Number: Nurse, adult Patient Account Number:  Date of Birth/Sex: Jan 29, 1930 (84 y.o. F) Treating RN: Primary Care Faiz Weber: [name] Other Clinician: Referring Shandy Checo: [phone] Treating Nuriya Stuck/Extender: 12/06/2018, HOYT Weeks in Treatment: 4 Encounter Discharge Information Items Discharge Condition: Stable Ambulatory Status: Ambulatory Discharge Destination: Home Transportation: Private Auto Accompanied By: self Schedule Follow-up Appointment: Yes Clinical Summary of Care: Electronic Signature(s) Signed: 12/04/2018 1:27:23 PM By: 12/05/2018, BSN, RN, CWS, Kim RN, BSN Entered By: [name], BSN, RN, CWS, Kim on 12/03/2018 10:56:46 [name] (12/05/2018) -------------------------------------------------------------------------------- Lower Extremity Assessment Details Patient Name: [name], [name] Date of Service: 12/03/2018 10:30 AM Medical Record Number: 13/05/[id number] Patient Account Number: [id number] Date of Birth/Sex: [birth year] (84 y.o. F) Treating RN: Aram Beecham Primary Care Odie Rauen: Linwood Dibbles Other Clinician: Referring Adhya Cocco: 57 Treating Natasha Tout/Extender: 12/06/2018, HOYT Weeks in Treatment: 70 Electronic Signature(s) Signed: 12/03/2018 4:55:08 PM By: 12/05/2018 Entered By: [name] on 12/03/2018 10:36:58 [name] (12/05/2018) -------------------------------------------------------------------------------- Multi Wound Chart Details Patient Name: [name] Date of Service: 12/03/2018 10:30 AM Medical Record Number: 13/05/1930 Patient Account Number: [id number] Date of Birth/Sex: 03-Feb-1930 (84 y.o. F) Treating RN: Aram Beecham Primary Care Shivangi Lutz: Linwood Dibbles Other Clinician: Referring Dera Vanaken: 57 Treating Emmali Karow/Extender: 12/05/2018, HOYT Weeks in Treatment: 55 Vital Signs Height(in): 60 Pulse(bpm): 66 Weight(lbs): 123 Blood Pressure(mmHg): 149/53 Body Mass Index(BMI): 24 Temperature(F):  98.0 Respiratory Rate 18 (breaths/min): Photos: [9:No Photos] [N/A:N/A] Wound Location: [9:Left Elbow] [N/A:N/A] Wounding Event: [9:Trauma] [N/A:N/A] Primary Etiology: [9:Trauma, Other] [N/A:N/A] Comorbid History: [9:Cataracts, Glaucoma, Optic Neuritis, Chronic sinus problems/congestion, Middle ear problems] [N/A:N/A] Date Acquired: [9:01/26/2018] [N/A:N/A] Weeks of Treatment: [9:31] [N/A:N/A] Wound Status: [9:Open] [N/A:N/A] Measurements L x W x D [9:0.8x0.5x0.2] [N/A:N/A] (cm) Area (cm) : [9:0.314] [N/A:N/A] Volume (cm) : [9:0.063] [N/A:N/A] % Reduction in Area: [9:-342.30%] [N/A:N/A] % Reduction in Volume: [9:-350.00%] [N/A:N/A] Starting Position 1 [9:6] (o'clock): Ending Position 1 [9:8] (o'clock): Maximum  Distance 1 (cm): [9:0.6] Undermining: [9:Yes] [N/A:N/A] Classification: [9:Full Thickness With Exposed Support Structures] [N/A:N/A] Exudate Amount: [9:Medium] [N/A:N/A] Exudate Type: [9:Purulent] [N/A:N/A] Exudate Color: [9:yellow, brown, green] [N/A:N/A] Wound Margin: [9:Distinct, outline attached] [N/A:N/A] Granulation Amount: [9:Large (67-100%)] [N/A:N/A] Granulation Quality: [9:Pink] [N/A:N/A] Necrotic Amount: [9:Small (1-33%)] [N/A:N/A] Exposed Structures: [9:Fat Layer (Subcutaneous Tissue) Exposed: Yes Fascia: No Tendon: No Muscle: No] [N/A:N/A] Joint: No Bone: No Epithelialization: None N/A N/A Periwound Skin Texture: Excoriation: No N/A N/A Induration: No Callus: No Crepitus: No Rash: No Scarring: No Periwound Skin Moisture: Maceration: Yes N/A N/A Dry/Scaly: No Periwound Skin Color: Erythema: Yes N/A N/A Atrophie Blanche: No Cyanosis: No Ecchymosis: No Hemosiderin Staining: No Mottled: No Pallor: No Rubor: No Erythema Location: Circumferential N/A N/A Temperature: No Abnormality N/A N/A Tenderness on Palpation: Yes N/A N/A Wound Preparation: Ulcer Cleansing: N/A N/A Rinsed/Irrigated with Saline Topical Anesthetic Applied: Other:  lidocaine 4% Treatment Notes Electronic Signature(s) Signed: 12/04/2018 1:37:43 PM By: Arnette Norris Entered By: Arnette Norris on 12/03/2018 10:47:30 [name] ([id number]) -------------------------------------------------------------------------------- Multi-Disciplinary Care Plan Details Patient Name: [name] Date of Service: 12/03/2018 10:30 AM Medical Record Number: [id number] Patient Account Number: [id number] Date of Birth/Sex: [birth year] (84 y.o. F) Treating RN: Arnette Norris Primary Care Athea Haley: Aram Beecham Other Clinician: Referring Ranita Stjulien: Aram Beecham Treating Jaimie Pippins/Extender: Linwood Dibbles, HOYT Weeks in Treatment: 8 Active Inactive Soft Tissue Infection Nursing Diagnoses: Impaired tissue integrity Goals: Patient's soft tissue infection will resolve Date Initiated: 07/18/2018 Target Resolution Date: 07/27/2018 Goal Status: Active Interventions: Assess signs and symptoms of infection every visit Treatment Activities: Culture and sensitivity : 07/18/2018 Systemic antibiotics : 07/18/2018 Notes: Wound/Skin Impairment Nursing Diagnoses: Impaired tissue integrity Knowledge deficit related to ulceration/compromised skin integrity Goals: Patient/caregiver will verbalize understanding of skin care regimen Date Initiated: 11/14/2017 Target Resolution Date: 11/28/2017 Goal Status: Active Ulcer/skin breakdown will have a volume reduction of 30% by week 4 Date Initiated: 11/14/2017 Target Resolution Date: 11/28/2017 Goal Status: Active Interventions: Assess patient/caregiver ability to obtain necessary supplies Assess patient/caregiver ability to perform ulcer/skin care regimen upon admission and as needed Assess ulceration(s) every visit Treatment Activities: Skin care regimen initiated : 11/14/2017 ALIMA, ALTO ([id number]) Notes: Electronic Signature(s) Signed: 12/04/2018 1:37:43 PM By: Arnette Norris Entered By: Arnette Norris on  12/03/2018 10:47:20 [name] ([id number]) -------------------------------------------------------------------------------- Pain Assessment Details Patient Name: [name] Date of Service: 12/03/2018 10:30 AM Medical Record Number: [id number] Patient Account Number: [id number] Date of Birth/Sex: 1930-02-08 (84 y.o. F) Treating RN: Arnette Norris Primary Care Luria Rosario: Aram Beecham Other Clinician: Referring Asyah Candler: Aram Beecham Treating Tonga Prout/Extender: Linwood Dibbles, HOYT Weeks in Treatment: 31 Active Problems Location of Pain Severity and Description of Pain Patient Has Paino No Site Locations Pain Management and Medication Current Pain Management: Electronic Signature(s) Signed: 12/03/2018 3:57:44 PM By: Dayton Martes RCP, RRT, CHT Signed: 12/04/2018 1:37:43 PM By: Arnette Norris Entered By: Dayton Martes on 12/03/2018 10:32:24 [name] ([id number]) -------------------------------------------------------------------------------- Patient/Caregiver Education Details Patient Name: [name] Date of Service: 12/03/2018 10:30 AM Medical Record Number: [id number] Patient Account Number: [id number] Date of Birth/Gender: February 06, 1930 (84 y.o. F) Treating RN: Arnette Norris Primary Care Physician: Aram Beecham Other Clinician: Referring Physician: Aram Beecham Treating Physician/Extender: Skeet Simmer in Treatment: 36 Education Assessment Education Provided To: Patient Education Topics Provided Wound/Skin Impairment: Handouts: Caring for Your Ulcer, Other: continue wound care as prescribed Methods: Demonstration, Explain/Verbal Responses: State content correctly Electronic Signature(s) Signed: 12/04/2018 1:27:23 PM By: Elliot Gurney, BSN, RN, CWS, Kim RN, BSN Entered By:  Elliot Gurney, BSN, RN, CWS, Kim on 12/03/2018 10:57:07 [name], [name]  ([id number]) -------------------------------------------------------------------------------- Wound Assessment Details Patient Name: [name], [name] Date of Service: 12/03/2018 10:30 AM Medical Record Number: [id number] Patient Account Number: [id number] Date of Birth/Sex: [birth year] (84 y.o. F) Treating RN: Curtis Sites Primary Care Ashely Goosby: Aram Beecham Other Clinician: Referring Shaliah Wann: Aram Beecham Treating Galen Russman/Extender: Linwood Dibbles, HOYT Weeks in Treatment: 54 Wound Status Wound Number: 9 Primary Trauma, Other Etiology: Wound Location: Left Elbow Wound Open Wounding Event: Trauma Status: Date Acquired: 01/26/2018 Comorbid Cataracts, Glaucoma, Optic Neuritis, Chronic Weeks Of Treatment: 31 History: sinus problems/congestion, Middle ear problems Clustered Wound: No Photos Photo Uploaded By: Curtis Sites on 12/03/2018 14:21:41 Wound Measurements Length: (cm) 0.8 Width: (cm) 0.5 Depth: (cm) 0.2 Area: (cm) 0.314 Volume: (cm) 0.063 % Reduction in Area: -342.3% % Reduction in Volume: -350% Epithelialization: None Tunneling: No Undermining: Yes Starting Position (o'clock): 6 Ending Position (o'clock): 8 Maximum Distance: (cm) 0.6 Wound Description Full Thickness With Exposed Support Classification: Structures Wound Margin: Distinct, outline attached Exudate Medium Amount: Exudate Type: Purulent Exudate Color: yellow, brown, green Foul Odor After Cleansing: No Slough/Fibrino No Wound Bed Granulation Amount: Large (67-100%) Exposed Structure Granulation Quality: Pink Fascia Exposed: No Necrotic Amount: Small (1-33%) Fat Layer (Subcutaneous Tissue) Exposed: Yes [name], [name] ([id number]) Necrotic Quality: Adherent Slough Tendon Exposed: No Muscle Exposed: No Joint Exposed: No Bone Exposed: No Periwound Skin Texture Texture Color No Abnormalities Noted: No No Abnormalities Noted: No Callus: No Atrophie Blanche: No Crepitus:  No Cyanosis: No Excoriation: No Ecchymosis: No Induration: No Erythema: Yes Rash: No Erythema Location: Circumferential Scarring: No Hemosiderin Staining: No Mottled: No Moisture Pallor: No No Abnormalities Noted: No Rubor: No Dry / Scaly: No Maceration: Yes Temperature / Pain Temperature: No Abnormality Tenderness on Palpation: Yes Wound Preparation Ulcer Cleansing: Rinsed/Irrigated with Saline Topical Anesthetic Applied: Other: lidocaine 4%, Treatment Notes Wound #9 (Left Elbow) Notes endoform, border foam dressing Electronic Signature(s) Signed: 12/03/2018 4:55:08 PM By: Curtis Sites Entered By: Curtis Sites on 12/03/2018 10:37:43 [name] ([id number]) -------------------------------------------------------------------------------- Vitals Details Patient Name: [name] Date of Service: 12/03/2018 10:30 AM Medical Record Number: [id number] Patient Account Number: [id number] Date of Birth/Sex: [birth year] (84 y.o. F) Treating RN: Arnette Norris Primary Care Kashton Mcartor: Aram Beecham Other Clinician: Referring Kentaro Alewine: Aram Beecham Treating Ronold Hardgrove/Extender: Linwood Dibbles, HOYT Weeks in Treatment: 42 Vital Signs Time Taken: 10:32 Temperature (F): 98.0 Height (in): 60 Pulse (bpm): 66 Weight (lbs): 123 Respiratory Rate (breaths/min): 18 Body Mass Index (BMI): 24 Blood Pressure (mmHg): 149/53 Reference Range: 80 - 120 mg / dl Electronic Signature(s) Signed: 12/03/2018 3:57:44 PM By: Dayton Martes RCP, RRT, CHT Entered By: Dayton Martes on 12/03/2018 10:32:55

## 2018-12-08 NOTE — Progress Notes (Signed)
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 depending on whether the area is being cleansed/touched or not. She always has some pain however. She does see [facility] and does have compression hose that she typically wears. She states however she has not been wearing them as much since she was dealing with this issue due to the fact that she notes that the wound seems to leak and bleed more when she has the compression hose on. 11/22/17; patient was readmitted to clinic last week with a traumatic wound on her right anterior leg. This is a reasonably small wound but covered in an adherent necrotic debris. She is been using Santyl. 11/29/17 minimal improvement in wound dimensions to this initially traumatic wound on her right anterior leg. Reasonably small wound but still adherent thick necrotic debris. We have been using Santyl 12/06/17 traumatic wound on the right anterior leg. Small wound but again adherent necrotic debris on the surface 95%. We have been using Santyl 12/13/86; small lright anterior traumatic leg wound. Using Santyl that again with adherent debris perhaps down to 50%. I changed her to Iodoflex today 12/20/17; right anterior leg traumatic wound. She again presents with debris about 50% of the wound. I changed her to Iodoflex last week but so far not a lot in the way of response 12/27/17; right anterior leg traumatic wound. She again presents with debris on the wound although it looks better. She is using Iodoflex entering her third week  now. Still requiring debridement 01/16/18 on evaluation today patient seems to be doing fairly well in regard to her right lower extremity ulcer. She has been tolerating the dressing changes without complication. With that being said she does note that she's been having a lot of burning with the current dressing which is specifically the Iodoflex. Obviously this is a known side effect of the iodine in the dressing and I believe that may be giving her trouble. No fevers, chills, nausea, or vomiting noted at this time. Otherwise the wound does appear to be doing well. [name], [name] ([id number]) 01/30/18 on evaluation today patient appears to be doing well in regard to her right anterior lower extremity ulcer. She notes that this does seem to be smaller and she wonders why we did not start the Prisma dressing sooner since it has made such a big difference in such a short amount of time.  I explained that obviously we have to wait for the wound to get to a certain point along his healing path before we can initiate the Prisma otherwise it will not be effective. Therefore once the wound became clean it was then time to initiate the Prisma. Nonetheless good news is she is noting excellent improvement she does still have some discomfort but nothing as significant as previously noted. 04/17/18 on evaluation today patient appears to be doing very well and in fact her right lower extremity ulcer has completely healed at this point I'm pleased with this. The left lower extremity ulcer seem to be doing better although she still does have some openings noted the Prisma I think is helping more than the Xeroform was in my pinion. With that being said she still has a lot of healing to do in this regard. 04/27/18 on evaluation today patient appears to be doing very well in regard to her left lower Trinity ulcers. She has been tolerating the dressing changes without complication. I do have a note from her orthopedic  surgeon today and they would like for me to help with treating her left elbow surgery site where she had the bursa removed and this was performed roughly 4 weeks ago according to the note that I reviewed. She has been placed on Bactrim DS by need for her leg wounds this probably helped a little bit with the left elbow surgery site. Obviously I do think this is something we can try to help her out with. 05/04/18 on evaluation today patient appears to be doing well in regard to her left anterior lower Trinity ulcers. She is making good progress which is great news. Unfortunately her elbow which we are also managing at this point in time has not made as much progress unfortunately. She has been tolerating the dressing changes without complication. She did see Dr. Darleen Crocker earlier today and he states that he's willing to give this three weeks to see if she's making any progress with wound care. However he states that she's really not then he will need to go back in and perform further surgery. Obviously she is trying to avoid surgery if at all possible although I'm not sure if this is going to be possible or least not that quickly. 05/11/18 on evaluation today patient appears to be doing very well in regard to her left lower extremity ulcers. Unfortunately in regard to her elbow this is very slow coming about as far as any improvement is concerned. I do feel like there may be a little bit more granulation noted in the base of the wound but nothing too significant unfortunately. I still can probe bone in the proximal portion of the wound which obviously explain to the patient is not good. She will be having a follow-up with her orthopedic surgeon in the next couple of weeks. In the meantime we are trying to do as much as we can to try to show signs of improvement in healing to avoid the need for any additional and further surgery. Nonetheless I explained to the patient yet again today I'm not sure if that is  going to be feasible or not obviously it's more risk for her to continue to have an open wound with bone exposure then to the back in for additional surgery even though I know she doesn't want to go that route. 05/15/18 on evaluation today patient presents for follow-up concerning her ongoing lower extremity ulcers on the left as well as  the left elbow ulcer. She has at this point in time been tolerating the dressing changes without complication. Her left lower extremity ulcer appears to be doing very well. In regard to the left elbow ulcer she actually does seem to have additional granulation today which is good news. I am definitely seeing signs of improvement although obviously this is somewhat slow improvement. Nonetheless I'm hopeful we will be able to avoid her having to have any further surgery but again that would definitely be a conversation between herself as well as her surgeon once he sees her for reevaluation. Otherwise she does want to see about having a three order compression stockings for her today 05/21/18 on evaluation today patient appears to be doing well in regard to her left lower surety ulcer. This is almost completely healed and seems to be progressing very nicely. With that being said her left elbow is another story. I'm not really convinced in the past three weeks we've seen a significant improvement in this wound. With that being said if this is something that there is no surgical option for him we have to continue to work on this from the standpoint of conservative management with wound care she may make improvement given time. Nonetheless it appears that her surgeon is somewhat concerned about the possibility of infection and really is leaning towards additional surgery to try and help close this wound. Nonetheless the patient is still unsure of exactly what to do. 05/29/18 on evaluation today patient appears to be doing well in regard to her left lower extremity ulcer.  She's been tolerating the dressing changes without complication which is good news. With that being said she's been having issues specifically with her elbow she did see her surgeon Dr. Joice Lofts and he is recommending a repeat surgery to the left elbow in order to correct the issue. The patient is still somewhat unsure of this but feels like this may be better than trying to take time to let this heal over a longer period of time through normal wound care measures. Again I explained that I agree this may be a faster way to go if her surgeon feels that this is indeed a good direction to take. Obviously only he can make the judgment on whether or not the surgery would likely be successful. [name], [name] ([id number]) 06/04/18 on evaluation today patient actually presents for follow-up concerning her left elbow and left lower from the ulcer she seems to be doing very well at this point in time. She has been tolerating the dressing changes without complication. With that being said her elbow is not significantly better she actually is scheduled for surgery tomorrow. 07/04/18; the patient had an area on her left leg that is remaining closed. The open area she has now is a postsurgical wound on the left elbow. I think we have clearance from the surgeon to see this now. We're using Prisma 07/11/18; we're currently dealing with a surgical wound on the left olecranon process. The patient complains of a lot of pain and drainage. When I saw her last week we did an x-ray that showed soft tissue wound and probable elbow joint effusion but no erosion to suggest osteomyelitis. The culture I did of this was somewhat surprisingly negative. She has a small open wound with not a viable surface there is considerable undermining relative to the wound size. She is on methotrexate for rheumatoid arthritis/overlap syndrome also plaquenil. We've been using silver collagen 07/18/18-She is seen in follow-up  evaluation for a left  elbow wound. There is essentially no change. She is currently on Zithromax and will complete that on Friday, there is no indication to extend this. We will change to iodosorb/iodoflex and monitor for response 07/25/18-She is seen in follow-up evaluation for left elbow wound. The wound is stable with no overt evidence of infection. She has counseled with her rheumatologist. She is wanting to restart her methotrexate; a culture was obtained to rule out occult infection before starting her methotrexate. We will continue with Iodosorb/Iodoflex and she will follow-up next week. 08/01/18; this is a difficult wound over her left olecranon process. There is been concerned about infection although cultures including one done last week were negative. Pending 3 weeks ago I gave her an empiric course of antibiotics. She is having a lot of rheumatologic pain in her hands with pain and stiffness. She wants to go on her weekly methotrexate and I think it would be reasonable to do so. We have been using Iodoflex 08/01/18; difficult wound over her left olecranon process. She started back on methotrexate last week because of rheumatologic pain in her hands. We have been using Iodoflex to try and clean out the wound bed. She has been approved for Graphix PL 08/15/18; 2 week follow-up. Difficult wound over her left olecranon process. Graphix PL #1 with collagen backing 08/22/18; one-week follow-up. Difficult wound over her left olecranon process. Graphix PL #2 08/29/18; no major improvement. Difficult wound over her left olecranon process. Still considerable undermining. Graphics PL #3 1 week follow-up. Graphix #4 09/12/18 graphics #5. Some improvement in wound area although the undermining superiorly still has not closed down as much as I would like 09/19/18; Graphix #6 I think there is improvement in the undermining from 7 to 9:00. Wound bed looks healthy. 09/26/18 Graffix #7 undermining is 0.5 cm maximally at roughly  8:00. From 12 to 7:00 the tissue is adherent which is a major improvement there is some advancing skin from this side. 10/03/18; Graphix #8 no major changes from last week 10/10/18 Graffix #9 There are improvements. There appears to be granulation coming up to the surface here and there is a lot less undermining at 8:00. 10/17/18. Graffix #10; Dimensions are improved less undermining surface felt the but the wound is still open. Initially a surgical wound following a bursectomy 10/24/18; Graffix #11. This is really stalled over the last 2 weeks. If there is no further improvement this will be the last application.The final option for this difficult area would be plastic surgery and will set up a consult with Dr. Marina Goodell in [facility] 10/31/18; wound looks about the same. The undermining superiorly is 0.7 cm. On the lateral edges perhaps some improvement there is no drainage. 11-07-2018 patient seen today for follow-up and management of left elbow wound. She has completed a total of 11 treatments of the graffix with not much improvement. She has an upcoming appointment with plastic surgery to assist with additional treatment options for the left elbow wound on 11/19/18. There is significant amount surrounding undermining of the wound is 0.9 cm. Currently prescribed methotrexate. Wound is being treated with Indoform and border dressing. No drainage from wound. No fever, chills. or pain. 11/21/18; the patient continues to have the wound looking roughly the same with undermining from about 12 to 6:00. This has not changed all that much. She does have skin irritation around the wound that looks like drainage maceration issues. The patient states that she was not able to have her  wound dressing changed because of illness in the person he usually does this. She also did not attend her clinic appointment today with Dr. Marina Goodell because of transportation issues. She is rebooked for some time in mid  January 06/28/2018; the patient has less undermining using endoform. As a understandings she saw Dr. Thad Ranger who is Dr. Lonni Fix partner. He recommended putting her in a elbow brace and I believe is written a prescription for it. He also recommended Motrin 800 mg 3 times daily. This is prescription strength ibuprofen although he did not write his prescription. This apparently was for 2 weeks. Culture I did last time grew a few methicillin sensitive staph aureus. After some difficulty due to drug intolerances/allergies and drug interactions I settled on a 5-day course of azithromycin [name], SADDLER ([id number]) 12/03/18 on evaluation today patient actually appears to be doing fairly well in regard to her elbow when compared to last time I evaluated her. With that being said there does not appear to be any signs of infection at this time. That was the big concern currently as far as the patient was concerned. Nonetheless I do feel like she is making progress in regard to the feeling of this ulcer it has been slow. She did see a Engineer, petroleum they are talking about putting her in a brace in order to allow this area to heal more appropriately. Patient History Information obtained from Patient. Social History Never smoker. Medical And Surgical History Notes Constitutional Symptoms (General Health) Back pain Ear/Nose/Mouth/Throat bilateral hearing aides Review of Systems (ROS) Constitutional Symptoms (General Health) Denies complaints or symptoms of Fever, Chills. Respiratory The patient has no complaints or symptoms. Cardiovascular The patient has no complaints or symptoms. Psychiatric The patient has no complaints or symptoms. Objective Constitutional Well-nourished and well-hydrated in no acute distress. Vitals Time Taken: 10:32 AM, Height: 60 in, Weight: 123 lbs, BMI: 24, Temperature: 98.0 F, Pulse: 66 bpm, Respiratory Rate: 18 breaths/min, Blood Pressure: 149/53  mmHg. Respiratory normal breathing without difficulty. clear to auscultation bilaterally. Cardiovascular regular rate and rhythm with normal S1, S2. Psychiatric this patient is able to make decisions and demonstrates good insight into disease process. Alert and Oriented x 3. pleasant and cooperative. General Notes: Patient's wound bed currently did not show any signs of infection and did not require any sharp debridement at this point. Both of which were excellent news. Fortunately there is no sign of anything worsening at this time which is also HELLON, VACCARELLA. ([id number]) good news. I am very pleased in this regard. Integumentary (Hair, Skin) Wound #9 status is Open. Original cause of wound was Trauma. The wound is located on the Left Elbow. The wound measures 0.8cm length x 0.5cm width x 0.2cm depth; 0.314cm^2 area and 0.063cm^3 volume. There is Fat Layer (Subcutaneous Tissue) Exposed exposed. There is no tunneling noted, however, there is undermining starting at 6:00 and ending at 8:00 with a maximum distance of 0.6cm. There is a medium amount of purulent drainage noted. The wound margin is distinct with the outline attached to the wound base. There is large (67-100%) pink granulation within the wound bed. There is a small (1-33%) amount of necrotic tissue within the wound bed including Adherent Slough. The periwound skin appearance exhibited: Maceration, Erythema. The periwound skin appearance did not exhibit: Callus, Crepitus, Excoriation, Induration, Rash, Scarring, Dry/Scaly, Atrophie Blanche, Cyanosis, Ecchymosis, Hemosiderin Staining, Mottled, Pallor, Rubor. The surrounding wound skin color is noted with erythema which is circumferential. Periwound temperature was noted as No  Abnormality. The periwound has tenderness on palpation. 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 Anesthetic (add to Medication List): Wound #9 Left Elbow: Topical Lidocaine 4% cream applied to wound bed prior to debridement (In Clinic Only). Skin Barriers/Peri-Wound Care: Wound #9 Left Elbow: Skin Prep Primary Wound Dressing: Wound #9 Left Elbow: Other: - Endoform Secondary Dressing: Wound #9 Left Elbow: Boardered Foam Dressing Dressing Change Frequency: Wound #9 Left Elbow: Change dressing every other day. Follow-up Appointments: Wound #9 Left Elbow: Return Appointment in 3 weeks. NOELLIA, LOPINTO ([id number]) I'm gonna recommend that we continue with the above wound care measures for the next week. The patient is in agreement with the plan. If anything changes or worsens in the meantime she will contact the office and let us know. Please see above for specific wound care orders. We will see patient for re-evaluation in 1 week(s) here in the clinic. If anything worsens or changes patient will contact our office for additional recommendations. Electronic Signature(s) Signed: 12/06/2018 10:16:39 PM By: Lenda Kelp PA-C Entered By: Lenda Kelp on 12/06/2018 22:02:52 [name] ([id number]) -------------------------------------------------------------------------------- ROS/PFSH Details Patient Name: [name] Date of Service: 12/03/2018 10:30 AM Medical Record Number: [id number] Patient Account Number: [id number] Date of Birth/Sex: [birth year] (84 y.o. F) Treating RN: Arnette Norris Primary Care Provider: Aram Beecham Other Clinician: Referring Provider: Aram Beecham Treating Provider/Extender: Linwood Dibbles, HOYT Weeks in Treatment: 62 Information Obtained From Patient Wound History Do you currently have one or more open woundso Yes How many open wounds do you currently haveo 1 Constitutional Symptoms (General Health) Complaints  and Symptoms: Negative for: Fever; Chills Medical History: Past Medical History Notes: Back pain Eyes Medical History: Positive for: Cataracts; Glaucoma; Optic Neuritis Ear/Nose/Mouth/Throat Medical History: Positive for: Chronic sinus problems/congestion; Middle ear problems Past Medical History Notes: bilateral hearing aides Respiratory Complaints and Symptoms: No Complaints or Symptoms Cardiovascular Complaints and Symptoms: No Complaints or Symptoms Psychiatric Complaints and Symptoms: No Complaints or Symptoms HBO Extended History Items Ear/Nose/Mouth/Throat: Eyes: Eyes: Ear/Nose/Mouth/Throat: Chronic sinus Cataracts Glaucoma Middle ear problems problems/congestion Immunizations EBONIE, GRISSETT ([id number]) Pneumococcal Vaccine: Received Pneumococcal Vaccination: No Implantable Devices Family and Social History Never smoker Physician Affirmation I have reviewed and agree with the above information. Electronic Signature(s) Signed: 12/06/2018 10:16:39 PM By: Lenda Kelp PA-C Signed: 12/07/2018 5:10:00 PM By: Arnette Norris Entered By: Lenda Kelp on 12/06/2018 22:02:05 [name] ([id number]) -------------------------------------------------------------------------------- SuperBill Details Patient Name: [name] Date of Service: 12/03/2018 Medical Record Number: [id number] Patient Account Number: [id number] Date of Birth/Sex: 03/14/1930 (84 y.o. F) Treating RN: Arnette Norris Primary Care Provider: Aram Beecham Other Clinician: Referring Provider: Aram Beecham Treating Provider/Extender: Linwood Dibbles, HOYT Weeks in Treatment: 22 Diagnosis Coding ICD-10 Codes Code Description S51.002S Unspecified open wound of left elbow, sequela L98.495 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 Facility Procedures CPT4 Code: [id number] Description: 99213 - WOUND CARE VISIT-LEV 3 EST  PT Modifier: Quantity: 1 Physician Procedures CPT4: Description Modifier Quantity Code 3013143 99214 - WC PHYS LEVEL 4 - EST PT 1 ICD-10 Diagnosis Description S51.002S Unspecified open wound of left elbow, sequela L98.495 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  without evidence of necrosis Electronic Signature(s) Signed: 12/06/2018 10:16:39 PM By: Larina Bras  III, Hoyt PA-C Entered By: Lenda Kelp on 12/06/2018 22:03:11

## 2018-12-10 ENCOUNTER — Ambulatory Visit: Payer: Medicare Other | Admitting: Occupational Therapy

## 2018-12-10 ENCOUNTER — Encounter: Payer: Self-pay | Admitting: Occupational Therapy

## 2018-12-10 ENCOUNTER — Ambulatory Visit: Payer: Medicare Other

## 2018-12-10 ENCOUNTER — Other Ambulatory Visit: Payer: Self-pay

## 2018-12-10 DIAGNOSIS — M6281 Muscle weakness (generalized): Secondary | ICD-10-CM

## 2018-12-10 DIAGNOSIS — R2681 Unsteadiness on feet: Secondary | ICD-10-CM | POA: Diagnosis not present

## 2018-12-10 DIAGNOSIS — R262 Difficulty in walking, not elsewhere classified: Secondary | ICD-10-CM

## 2018-12-10 DIAGNOSIS — S41102S Unspecified open wound of left upper arm, sequela: Secondary | ICD-10-CM

## 2018-12-10 DIAGNOSIS — M545 Low back pain, unspecified: Secondary | ICD-10-CM

## 2018-12-10 DIAGNOSIS — Z9181 History of falling: Secondary | ICD-10-CM

## 2018-12-10 DIAGNOSIS — G8929 Other chronic pain: Secondary | ICD-10-CM

## 2018-12-10 NOTE — Patient Instructions (Signed)
Keep elbow splint on at all times per MD order - but can take off 2-3 x day for AROM pain free for elbow flexion and extention  10 -15 reps

## 2018-12-10 NOTE — Therapy (Signed)
[place] [address] [place], [address] Phone: [phone]   Fax:  [phone]  Physical Therapy Treatment  Patient Details  Name: [name] MRN: [id number] Date of Birth: 04-Jun-1930 Referring Provider (PT): Jennings Books, MD   Encounter Date: 12/10/2018  PT End of Session - 12/10/18 1519    Visit Number  18    Number of Visits  33    Date for PT Re-Evaluation  12/27/18    Authorization Type  9    Authorization Time Period  of 10 progress report    PT Start Time  1519    PT Stop Time  1604    PT Time Calculation (min)  45 min    Equipment Utilized During Treatment  Gait belt    Activity Tolerance  Patient tolerated treatment well    Behavior During Therapy  WFL for tasks assessed/performed       Past Medical History:  Diagnosis Date  . Anemia   . GERD (gastroesophageal reflux disease)   . Hypertension   . Peripheral vascular disease (HCC)    possible neuropathies in lower extremeties  . PONV (postoperative nausea and vomiting)    happens sometimes but better with pre med of zofran  . Presence of permanent cardiac pacemaker   . Syncope     Past Surgical History:  Procedure Laterality Date  . ABDOMINAL HYSTERECTOMY  1969  . APPENDECTOMY  1969   with hysterectomy  . BACK SURGERY  2001   rods in back. surgery on back x 3  . COLONOSCOPY    . I&D EXTREMITY Left 03/27/2018   Procedure: IRRIGATION AND DEBRIDEMENT LEFT ELBOW / OLECRANON BURSA;  Surgeon: Corky Mull, MD;  Location: [facility];  Service: Orthopedics;  Laterality: Left;  . I&D EXTREMITY Left 06/05/2018   Procedure: IRRIGATION AND DEBRIDEMENT EXTREMITY;  Surgeon: Corky Mull, MD;  Location: [facility];  Service: Orthopedics;  Laterality: Left;  . INSERT / REPLACE / REMOVE PACEMAKER  2004  . JOINT REPLACEMENT Left 2004   partial knee replacement  . OLECRANON BURSECTOMY Left 03/27/2018   Procedure: LEFT OLECRANON BURSA;  Surgeon:  Corky Mull, MD;  Location: [facility];  Service: Orthopedics;  Laterality: Left;  . PACEMAKER INSERTION  2014   dual chamber for complete heart block    There were no vitals filed for this visit.  Subjective Assessment - 12/10/18 1521    Subjective  Back is ok. Thinks her mattress makes her lower back achy. Feels better when she gets up and does her exercises.  5/10 back pain at most for the past 7 days. Does not really bother her.     Pertinent History  Imparied gait. Pt states that she has had balance issues for too long. Feels more comfortable walking with her rw. Had tests which showed that she has neuropathy. However the doctor at [facility] said that her balance is coming from her back. Just wants to be strong so she can walk.  Started walking with her rw off and on for the past several months.  Also feels like she might need a new sleep number bed. Also had 2 wound surgeries at [name]. Pt fell in February 2019. Had to stop PT due to the L elbow surgery April 06, 2018 to remove a bursa. The wound still has not healed.   Currently sees a wound doctor.   Feels numbness L foot from the  ankle down.  No R foot numbness.  Sometimes unsure of where her L LE is in space.  Pt also states that she tires too quickly.  Has not been able to walk her dog in 2 years. Has not really been doing exercises at home except the sit <> stand. Pt states that prolonged sitting without back support will increase back pain     Limitations  Standing;Walking    How long can you sit comfortably?  1 hr    How long can you stand comfortably?  10 minutes    How long can you walk comfortably?  2 or 3 blocks    Diagnostic tests  EMG testing BLEs and BUEs which were negative    Patient Stated Goals  Improve LE strength. Wants to use the machines at the gym. Walk more steadily. Be able to take her dog (16 lbs) out on a leash.     Currently in Pain?  No/denies    Pain Score  0-No pain    Pain Onset  More than a month ago          [facility] PT Assessment - 12/10/18 1525      Observation/Other Assessments   Observations  10 MWT 0.69 m/s average      Dynamic Gait Index   Level Surface  Mild Impairment    Change in Gait Speed  Mild Impairment    Gait with Horizontal Head Turns  Mild Impairment    Gait with Vertical Head Turns  Mild Impairment    Gait and Pivot Turn  Mild Impairment    Step Over Obstacle  Moderate Impairment    Step Around Obstacles  Normal    Steps  Mild Impairment    Total Score  16    DGI comment:  performed with rw                           PT Education - 12/10/18 1539    Education provided  Yes    Education Details  ther-ex    [employer]) Educated  Patient    Methods  Explanation;Demonstration;Tactile cues;Verbal cues    Comprehension  Verbalized understanding;Returned demonstration        Objectives  Pt ambulating with rw  TTP bilateral greater trochanter L >R   L elbow wound  No latex band allergies   MedbridgeAccess Code: [email or web address]   Ther-ex  CGAto SBAwith standing activities.  Directed patient with gait with normal gait speed, with changes in speed, 180 degree pivot turn, with R and L cervical rotation position, with cervical flexion and extension position, stepping around obstacles, stepping over an obstacle, ascending and descending 4 regular steps with UE assist    Improved DGI score by 3 points since eval   Seated manually resisted trunk flexionisometrics at neutral 10x3 with 5 second holds.    Seated manually resisted trunk extension isometrics in neutral 10x5 second for 3 sets  Standing hip abduction with B UE assist 2 lbs each LE 10x3   Stepping over shoe box with one UE assist, CGA 10x each LE             10 MWT with rw 14 seconds, 15 seconds (14.5 seconds average; 0.69 m/s average )   Gait with SPC CGA 200 ft. No LOB.    Forward step up onto 4 inch step with B UE assist R LE  10x2 L LE 10x2  Improved exercise technique, movement  at target joints, use of target muscles after mod verbal, visual, tactile cues.    Patient demonstrates slight improved 10 MWT and DGI scores since last measured. Pt also demonstrates overall decreased back pain since initial evaluation. Continued working on trunk and LE strengthening to promote ability to perform standing tasks. Pt tolerated session well without aggravation of symptoms.           PT Short Term Goals - 10/18/18 1850      PT SHORT TERM GOAL #1   Title  Patient will be independent with her HEP to improve strength and balance.     Time  3    Period  Weeks    Status  On-going    Target Date  09/27/18        PT Long Term Goals - 10/29/18 1643      PT LONG TERM GOAL #1   Title  Patient will improve her 10 MWT to 0.8 m/s or more with rw to promote better community ambulation.     Baseline  0.5 seconds with rw (09/06/2018); 0.63 seconds average with rw (10/18/2018)    Time  8    Period  Weeks    Status  On-going    Target Date  12/27/18      PT LONG TERM GOAL #2   Title  Patient will improve her DGI score using rw or least restrictive AD to 19/24 or more to promote balance.     Baseline  13/24 using rw (09/06/2018); 14/24 using rw (10/29/2018)    Time  8    Period  Weeks    Status  On-going    Target Date  12/27/18      PT LONG TERM GOAL #3   Title  Patient will improve bilateral LE strength by at least 1/2 MMT to promote ability to support herself when performing standing tasks and improve balance.     Time  8    Period  Weeks    Status  On-going      PT LONG TERM GOAL #4   Title  Patient will have a decrease in back pain/ache to 5/10 or less to promote ability to perform standing tasks with less difficulty.    Baseline  8/10 at most for the past 3 months (09/06/2018); 5-6/10 at worst for the past 7 days (10/18/2018)    Time  8    Period  Weeks    Status  Partially Met             Plan - 12/10/18 1540    Clinical Impression Statement  Pt demonstrates improved overall L knee pain and ability to step up using her L LE based on subjective reports. Continued working on glute strengthening to promote femoral control to help decrease knee pain as well as worked on overall LE and UE strengthening to promote ability to perform floor to stand transfers.     Rehab Potential  Fair    Clinical Impairments Affecting Rehab Potential  (-) chronicity of condition, age, comorbidities; (+) motivated    PT Frequency  2x / week    PT Duration  8 weeks    PT Treatment/Interventions  Aquatic Therapy;Gait training;Therapeutic activities;Therapeutic exercise;Balance training;Neuromuscular re-education;Patient/family education;Manual techniques;Dry needling    PT Next Visit Plan  trunk, hip, scapular strengthening, balance, gait, manual techniques PRN    Consulted and Agree with Plan of Care  Patient       Patient will benefit from skilled therapeutic intervention  in order to improve the following deficits and impairments:  Abnormal gait, Decreased activity tolerance, Decreased balance, Decreased endurance, Decreased range of motion, Decreased strength, Difficulty walking, Improper body mechanics, Postural dysfunction, Pain, Decreased knowledge of use of DME  Visit Diagnosis: Unsteadiness on feet  Difficulty in walking, not elsewhere classified  Muscle weakness (generalized)  Chronic low back pain, unspecified back pain laterality, unspecified whether sciatica present  History of falling     Problem List Patient Active Problem List   Diagnosis Date Noted  . Septic olecranon bursitis of left elbow 03/27/2018  . Lymphedema 10/11/2017  . Chronic venous insufficiency 10/11/2017  . Leg pain 10/11/2017  . Leg swelling 10/11/2017  . COPD (chronic obstructive pulmonary disease) ([place]) 10/11/2017  . Essential hypertension 10/11/2017    Joneen Boers PT, DPT   12/10/2018,  5:17 PM  [place] [facility] [address], [place], [address] Phone: [phone]   Fax:  [phone]  Name: [name] MRN: [id number] Date of Birth: 05-11-1930

## 2018-12-10 NOTE — Therapy (Signed)
[facility] [address] [place], [address] Phone: [phone]   Fax:  [phone]  Occupational Therapy Evaluation  Patient Details  Name: [name] MRN: [id number] Date of Birth: [birth year] Referring Provider (OT): Thad Ranger   Encounter Date: 12/10/2018  OT End of Session - 12/10/18 1506    Visit Number  1    Number of Visits  3    Date for OT Re-Evaluation  12/24/18    OT Start Time  1405    OT Stop Time  1455    OT Time Calculation (min)  50 min    Activity Tolerance  Patient tolerated treatment well    Behavior During Therapy  [facility] for tasks assessed/performed       Past Medical History:  Diagnosis Date  . Anemia   . GERD (gastroesophageal reflux disease)   . Hypertension   . Peripheral vascular disease (HCC)    possible neuropathies in lower extremeties  . PONV (postoperative nausea and vomiting)    happens sometimes but better with pre med of zofran  . Presence of permanent cardiac pacemaker   . Syncope     Past Surgical History:  Procedure Laterality Date  . ABDOMINAL HYSTERECTOMY  1969  . APPENDECTOMY  1969   with hysterectomy  . BACK SURGERY  2001   rods in back. surgery on back x 3  . COLONOSCOPY    . I&D EXTREMITY Left 03/27/2018   Procedure: IRRIGATION AND DEBRIDEMENT LEFT ELBOW / OLECRANON BURSA;  Surgeon: Christena Flake, MD;  Location: [facility];  Service: Orthopedics;  Laterality: Left;  . I&D EXTREMITY Left 06/05/2018   Procedure: IRRIGATION AND DEBRIDEMENT EXTREMITY;  Surgeon: Christena Flake, MD;  Location: [facility];  Service: Orthopedics;  Laterality: Left;  . INSERT / REPLACE / REMOVE PACEMAKER  2004  . JOINT REPLACEMENT Left 2004   partial knee replacement  . OLECRANON BURSECTOMY Left 03/27/2018   Procedure: LEFT OLECRANON BURSA;  Surgeon: Christena Flake, MD;  Location: [facility];  Service: Orthopedics;  Laterality: Left;  . PACEMAKER INSERTION  2014   dual chamber for  complete heart block    There were no vitals filed for this visit.  Subjective Assessment - 12/10/18 1502    Subjective   I had this wound since April - and seen plastic surgeon and want me to get elbow splint to keep elbow immobilize for wound to heal- and take off 2-3 x for ROM     Patient Stated Goals  Need elbow splint to keep my elbow splint to get wound to heal    Currently in Pain?  No/denies        [facility] OT Assessment - 12/10/18 0001      Assessment   Medical Diagnosis  Bursectomy need elbow splint     Referring Provider (OT)  Reynolds    Onset Date/Surgical Date  03/30/18    Hand Dominance  Right      Precautions   Required Braces or Orthoses  --   custom elbow splint for immobilization     Home  Environment   Lives With  Spouse      Prior Function   Vocation  Retired    Leisure  caretaker , drive , read      ROM / Strength   AROM / PROM / Strength  --   AROM WNL  OT Education - 12/10/18 1506    Education Details  splint wearing and precautions     Person(s) Educated  Patient    Methods  Explanation;Demonstration;Verbal cues    Comprehension  Verbalized understanding;Returned demonstration          OT Long Term Goals - 12/10/18 1511      OT LONG TERM GOAL #1   Title  Pt to be ind in donning , doffing and wearing of elbow splint     Baseline  needed min A and verbal cues - but able to demo in clinic     Time  2    Period  Weeks    Status  New    Target Date  12/24/18            Plan - 12/10/18 1507    Clinical Impression Statement  Pt refer to OT for custom elbow splint for L elbow to immobilize for wound healing - pt had bursectomy in April 19 - and been seen at wound clinic since then - but not healing - pt  seen Dr Thad Ranger that ordered elbow splint to keep elbow immobilize for wound healing -OT fabricated at [employer] elbow splint at 130 degrees so pt can drive and use her walker still - pt ed on donning ,  doffing and wearing of splint -as well as precautions - to take off 2-3 x day for AROM but pain free     Occupational performance deficits (Please refer to evaluation for details):  ADL's    Rehab Potential  Good    OT Frequency  --   1-2 visits   OT Duration  2 weeks    OT Treatment/Interventions  Splinting;Patient/family education    Plan  pt to contact me if need new straps or modifications to splint     Clinical Decision Making  Limited treatment options, no task modification necessary    Consulted and Agree with Plan of Care  Patient       Patient will benefit from skilled therapeutic intervention in order to improve the following deficits and impairments:  Decreased skin integrity, Decreased knowledge of precautions  Visit Diagnosis: Wound of left upper extremity, sequela - Plan: Ot plan of care cert/re-cert    Problem List Patient Active Problem List   Diagnosis Date Noted  . Septic olecranon bursitis of left elbow 03/27/2018  . Lymphedema 10/11/2017  . Chronic venous insufficiency 10/11/2017  . Leg pain 10/11/2017  . Leg swelling 10/11/2017  . COPD (chronic obstructive pulmonary disease) (HCC) 10/11/2017  . Essential hypertension 10/11/2017    [name] OTR/L,CLT 12/10/2018, 3:15 PM  [facility] [address] [place], [address] Phone: [phone]   Fax:  [phone]  Name: [name] MRN: [id number] Date of Birth: 18-May-1930

## 2018-12-13 ENCOUNTER — Ambulatory Visit: Payer: Medicare Other | Attending: Neurology

## 2018-12-13 DIAGNOSIS — M545 Low back pain, unspecified: Secondary | ICD-10-CM

## 2018-12-13 DIAGNOSIS — M6281 Muscle weakness (generalized): Secondary | ICD-10-CM | POA: Diagnosis present

## 2018-12-13 DIAGNOSIS — S41102S Unspecified open wound of left upper arm, sequela: Secondary | ICD-10-CM | POA: Diagnosis present

## 2018-12-13 DIAGNOSIS — R262 Difficulty in walking, not elsewhere classified: Secondary | ICD-10-CM | POA: Diagnosis present

## 2018-12-13 DIAGNOSIS — Z9181 History of falling: Secondary | ICD-10-CM | POA: Insufficient documentation

## 2018-12-13 DIAGNOSIS — R2681 Unsteadiness on feet: Secondary | ICD-10-CM | POA: Diagnosis present

## 2018-12-13 DIAGNOSIS — G8929 Other chronic pain: Secondary | ICD-10-CM | POA: Diagnosis present

## 2018-12-13 NOTE — Therapy (Signed)
[facility] [address] [place], [address] Phone: [phone]   Fax:  [phone]  Physical Therapy Treatment And Progress Report (10/18/2018 - 12/13/2018)  Patient Details  Name: [name] MRN: [id number] Date of Birth: March 17, 1930 Referring Provider (PT): Jennings Books, MD   Encounter Date: 12/13/2018  PT End of Session - 12/13/18 0953    Visit Number  19    Number of Visits  45    Date for PT Re-Evaluation  01/24/19    Authorization Type  10    Authorization Time Period  of 10 progress report    PT Start Time  0954   pt arrived late   PT Stop Time  1034    PT Time Calculation (min)  40 min    Equipment Utilized During Treatment  Gait belt    Activity Tolerance  Patient tolerated treatment well    Behavior During Therapy  [facility] for tasks assessed/performed       Past Medical History:  Diagnosis Date  . Anemia   . GERD (gastroesophageal reflux disease)   . Hypertension   . Peripheral vascular disease (HCC)    possible neuropathies in lower extremeties  . PONV (postoperative nausea and vomiting)    happens sometimes but better with pre med of zofran  . Presence of permanent cardiac pacemaker   . Syncope     Past Surgical History:  Procedure Laterality Date  . ABDOMINAL HYSTERECTOMY  1969  . APPENDECTOMY  1969   with hysterectomy  . BACK SURGERY  2001   rods in back. surgery on back x 3  . COLONOSCOPY    . I&D EXTREMITY Left 03/27/2018   Procedure: IRRIGATION AND DEBRIDEMENT LEFT ELBOW / OLECRANON BURSA;  Surgeon: Corky Mull, MD;  Location: [facility];  Service: Orthopedics;  Laterality: Left;  . I&D EXTREMITY Left 06/05/2018   Procedure: IRRIGATION AND DEBRIDEMENT EXTREMITY;  Surgeon: Corky Mull, MD;  Location: [facility];  Service: Orthopedics;  Laterality: Left;  . INSERT / REPLACE / REMOVE PACEMAKER  2004  . JOINT REPLACEMENT Left 2004   partial knee replacement  . OLECRANON  BURSECTOMY Left 03/27/2018   Procedure: LEFT OLECRANON BURSA;  Surgeon: Corky Mull, MD;  Location: [facility];  Service: Orthopedics;  Laterality: Left;  . PACEMAKER INSERTION  2014   dual chamber for complete heart block    There were no vitals filed for this visit.  Subjective Assessment - 12/13/18 0956    Subjective  Pt states that her elbow brace is bothering her wrist. Walking is ok. Uses the rw in the house. If she takes her time, she does not need to use the rw but uses her furniture.  Better able to walk at home without using her rw.  Pt states that her R anterior arm bothers her.  Pt state she wants to continue PT.     Pertinent History  Imparied gait. Pt states that she has had balance issues for too long. Feels more comfortable walking with her rw. Had tests which showed that she has neuropathy. However the doctor at [facility] said that her balance is coming from her back. Just wants to be strong so she can walk.  Started walking with her rw off and on for the past several months.  Also feels like she might need a new sleep number bed. Also had 2 wound surgeries at [name]. Pt fell in February 2019.  Had to stop PT due to the L elbow surgery April 06, 2018 to remove a bursa. The wound still has not healed.   Currently sees a wound doctor.   Feels numbness L foot from the ankle down.  No R foot numbness.  Sometimes unsure of where her L LE is in space.  Pt also states that she tires too quickly.  Has not been able to walk her dog in 2 years. Has not really been doing exercises at home except the sit <> stand. Pt states that prolonged sitting without back support will increase back pain     Limitations  Standing;Walking    How long can you sit comfortably?  1 hr    How long can you stand comfortably?  10 minutes    How long can you walk comfortably?  2 or 3 blocks    Diagnostic tests  EMG testing BLEs and BUEs which were negative    Patient Stated Goals  Improve LE strength. Wants to  use the machines at the gym. Walk more steadily. Be able to take her dog (16 lbs) out on a leash.     Currently in Pain?  Yes    Pain Score  8    R anterior shoulder pain when she moves her R arm.    Pain Onset  More than a month ago                               PT Education - 12/13/18 1650    Education provided  Yes    Education Details  ther-ex    [employer]) Educated  Patient    Methods  Explanation;Demonstration;Tactile cues;Verbal cues    Comprehension  Returned demonstration;Verbalized understanding      Objectives  Pt ambulating with rw  TTP bilateral greater trochanter L >R   L elbow wound  No latex band allergies   MedbridgeAccess Code: [email or web address]   Ther-ex  CGAto SBAwith standing activities.  Seated manually resisted trunk flexionisometrics at neutral 10x3with 5 second holds.   Seated manually resisted trunk extension isometrics in neutral 10x5 second for 3 sets   Forward step up onto 4 inch step with B UE assist R LE 10x2 L LE 10x2  Backwards and forward walk with PT assist and hand held assist 5 ft each direction  10x each direction. Min to mod A backwards, min A to CGA forward  Sideways walk with PT assist 5 ft each direction   7x to the R and 7x to the L min A   Stepping over shoe box with one UE assist, CGA 10x each LE  PT hand held assist at times min A when stepping over with R LE  Reviewed plan of care: continue 2x/week for 6 more weeks to continue progress  LAQ 2 lbs  10x2 each LE  Improved exercise technique, movement at target joints, use of target muscles after min to mod verbal, visual, tactile cues.   Pt demonstrates overall improved B LE strength, DGI score with rw, gait speed with rw and decreased back pain since initial evaluatino. Pt still demonstrates LE weakness, and difficulty walking and would benefit from continued skilled physical therapy services to  address the aforementioned deficits.             PT Short Term Goals - 12/13/18 1651      PT SHORT TERM GOAL #1   Title  Patient will  be independent with her HEP to improve strength and balance.     Time  3    Period  Weeks    Status  On-going    Target Date  01/03/19        PT Long Term Goals - 12/13/18 1652      PT LONG TERM GOAL #1   Title  Patient will improve her 10 MWT to 0.8 m/s or more with rw to promote better community ambulation.     Baseline  0.5 seconds with rw (09/06/2018); 0.63 seconds average with rw (10/18/2018); 0.69 m/s average using rw (12/10/2018)    Time  6    Period  Weeks    Status  On-going    Target Date  01/24/19      PT LONG TERM GOAL #2   Title  Patient will improve her DGI score using rw or least restrictive AD to 19/24 or more to promote balance.     Baseline  13/24 using rw (09/06/2018); 14/24 using rw (10/29/2018); 16/24 (12/10/2018)    Time  6    Period  Weeks    Status  On-going    Target Date  01/24/19      PT LONG TERM GOAL #3   Title  Patient will improve bilateral LE strength by at least 1/2 MMT to promote ability to support herself when performing standing tasks and improve balance.     Time  6    Period  Weeks    Status  Partially Met    Target Date  01/24/19      PT LONG TERM GOAL #4   Title  Patient will have a decrease in back pain/ache to 5/10 or less to promote ability to perform standing tasks with less difficulty.    Baseline  8/10 at most for the past 3 months (09/06/2018); 5-6/10 at worst for the past 7 days (10/18/2018); 5/10 at worst for the past 7 days (12/10/2018)    Time  6    Period  Weeks    Status  Achieved    Target Date  01/24/19            Plan - 12/13/18 1650    Clinical Impression Statement  Pt demonstrates overall improved B LE strength, DGI score with rw, gait speed with rw and decreased back pain since initial evaluatino. Pt still demonstrates LE weakness, and difficulty walking and would  benefit from continued skilled physical therapy services to address the aforementioned deficits.     History and Personal Factors relevant to plan of care:  Chronicity of condition, back pain, weakness, multiple comorbidities, age    Clinical Presentation  Stable    Clinical Presentation due to:  Pt making some progress with PT towards goals    Clinical Decision Making  Low    Rehab Potential  Fair    Clinical Impairments Affecting Rehab Potential  (-) chronicity of condition, age, comorbidities; (+) motivated    PT Frequency  2x / week    PT Duration  6 weeks    PT Treatment/Interventions  Aquatic Therapy;Gait training;Therapeutic activities;Therapeutic exercise;Balance training;Neuromuscular re-education;Patient/family education;Manual techniques;Dry needling    PT Next Visit Plan  trunk, hip, scapular strengthening, balance, gait, manual techniques PRN    Consulted and Agree with Plan of Care  Patient       Patient will benefit from skilled therapeutic intervention in order to improve the following deficits and impairments:  Abnormal gait, Decreased activity tolerance, Decreased balance, Decreased  endurance, Decreased range of motion, Decreased strength, Difficulty walking, Improper body mechanics, Postural dysfunction, Pain, Decreased knowledge of use of DME  Visit Diagnosis: Unsteadiness on feet - Plan: PT plan of care cert/re-cert  Difficulty in walking, not elsewhere classified - Plan: PT plan of care cert/re-cert  Muscle weakness (generalized) - Plan: PT plan of care cert/re-cert  Chronic low back pain, unspecified back pain laterality, unspecified whether sciatica present - Plan: PT plan of care cert/re-cert  History of falling - Plan: PT plan of care cert/re-cert     Problem List Patient Active Problem List   Diagnosis Date Noted  . Septic olecranon bursitis of left elbow 03/27/2018  . Lymphedema 10/11/2017  . Chronic venous insufficiency 10/11/2017  . Leg pain  10/11/2017  . Leg swelling 10/11/2017  . COPD (chronic obstructive pulmonary disease) ([place]) 10/11/2017  . Essential hypertension 10/11/2017    Thank you for your referral.  Joneen Boers PT, DPT   12/13/2018, 5:05 PM  [facility] [address], [place], [address] Phone: [phone]   Fax:  [phone]  Name: [name] MRN: [id number] Date of Birth: 12/14/1929

## 2018-12-17 ENCOUNTER — Ambulatory Visit: Payer: Medicare Other

## 2018-12-17 DIAGNOSIS — R262 Difficulty in walking, not elsewhere classified: Secondary | ICD-10-CM

## 2018-12-17 DIAGNOSIS — M545 Low back pain, unspecified: Secondary | ICD-10-CM

## 2018-12-17 DIAGNOSIS — R2681 Unsteadiness on feet: Secondary | ICD-10-CM

## 2018-12-17 DIAGNOSIS — M6281 Muscle weakness (generalized): Secondary | ICD-10-CM

## 2018-12-17 DIAGNOSIS — Z9181 History of falling: Secondary | ICD-10-CM

## 2018-12-17 DIAGNOSIS — G8929 Other chronic pain: Secondary | ICD-10-CM

## 2018-12-17 NOTE — Therapy (Signed)
[facility] [address], [place], [address] Phone: [phone]   Fax:  [phone]  Physical Therapy Treatment  Patient Details  Name: [name] MRN: [id number] Date of Birth: 1929/12/29 Referring Provider (PT): Jennings Books, MD   Encounter Date: 12/17/2018  PT End of Session - 12/17/18 1351    Visit Number  20    Number of Visits  45    Date for PT Re-Evaluation  01/24/19    Authorization Type  1    Authorization Time Period  of 10 progress report    PT Start Time  1351    PT Stop Time  1431    PT Time Calculation (min)  40 min    Equipment Utilized During Treatment  Gait belt    Activity Tolerance  Patient tolerated treatment well    Behavior During Therapy  [facility] for tasks assessed/performed       Past Medical History:  Diagnosis Date  . Anemia   . GERD (gastroesophageal reflux disease)   . Hypertension   . Peripheral vascular disease (HCC)    possible neuropathies in lower extremeties  . PONV (postoperative nausea and vomiting)    happens sometimes but better with pre med of zofran  . Presence of permanent cardiac pacemaker   . Syncope     Past Surgical History:  Procedure Laterality Date  . ABDOMINAL HYSTERECTOMY  1969  . APPENDECTOMY  1969   with hysterectomy  . BACK SURGERY  2001   rods in back. surgery on back x 3  . COLONOSCOPY    . I&D EXTREMITY Left 03/27/2018   Procedure: IRRIGATION AND DEBRIDEMENT LEFT ELBOW / OLECRANON BURSA;  Surgeon: Corky Mull, MD;  Location: [facility];  Service: Orthopedics;  Laterality: Left;  . I&D EXTREMITY Left 06/05/2018   Procedure: IRRIGATION AND DEBRIDEMENT EXTREMITY;  Surgeon: Corky Mull, MD;  Location: [facility];  Service: Orthopedics;  Laterality: Left;  . INSERT / REPLACE / REMOVE PACEMAKER  2004  . JOINT REPLACEMENT Left 2004   partial knee replacement  . OLECRANON BURSECTOMY Left 03/27/2018   Procedure: LEFT OLECRANON BURSA;  Surgeon:  Corky Mull, MD;  Location: [facility];  Service: Orthopedics;  Laterality: Left;  . PACEMAKER INSERTION  2014   dual chamber for complete heart block    There were no vitals filed for this visit.  Subjective Assessment - 12/17/18 1353    Subjective  Pt states wanting to take her elbow brace off.  The brace affects her walking and balance too.     Pertinent History  Imparied gait. Pt states that she has had balance issues for too long. Feels more comfortable walking with her rw. Had tests which showed that she has neuropathy. However the doctor at [facility] said that her balance is coming from her back. Just wants to be strong so she can walk.  Started walking with her rw off and on for the past several months.  Also feels like she might need a new sleep number bed. Also had 2 wound surgeries at [name]. Pt fell in February 2019. Had to stop PT due to the L elbow surgery April 06, 2018 to remove a bursa. The wound still has not healed.   Currently sees a wound doctor.   Feels numbness L foot from the ankle down.  No R foot numbness.  Sometimes unsure of where her L LE is in space.  Pt also states that she tires too quickly.  Has not been able to walk her dog in 2 years. Has not really been doing exercises at home except the sit <> stand. Pt states that prolonged sitting without back support will increase back pain     Limitations  Standing;Walking    How long can you sit comfortably?  1 hr    How long can you stand comfortably?  10 minutes    How long can you walk comfortably?  2 or 3 blocks    Diagnostic tests  EMG testing BLEs and BUEs which were negative    Patient Stated Goals  Improve LE strength. Wants to use the machines at the gym. Walk more steadily. Be able to take her dog (16 lbs) out on a leash.     Currently in Pain?  No/denies    Pain Score  0-No pain    Pain Onset  More than a month ago                               PT Education - 12/17/18 1358     Education provided  Yes    Education Details  ther-ex    [employer]) Educated  Patient    Methods  Explanation;Demonstration;Tactile cues;Verbal cues    Comprehension  Returned demonstration;Verbalized understanding      Objectives  Pt ambulating with rw  TTP bilateral greater trochanter L >R   L elbow wound  No latex band allergies   [employer] Code: [email or web address]   Ther-ex  CGAto SBAwith standing activities.   LAQ 2 lbs             10x3 each LE  Seated manually resisted trunk extension isometrics in neutral 10x5 second for 3 sets  standing hip abduction with B UE assist with 2 lbs   R 10x3  L 10x3  Backwards and forward walk with PT assist and hand held assist 5 ft each direction             10x each direction. Min to mod A backwards, min A  forward  Sideways walk with PT assist 5 ft each direction one UE to occasional 2 UE assist.              6x to the R and 6x to the L min A  Rest break secondary to fatigue.    Stepping over shoe box with one UE assist, CGA 10x each LE             PT hand held assist at times min A when stepping over with R LE  Forward step up onto 4 inch step with B UE assist R LE 10x3 L LE 10x3    Improved exercise technique, movement at target joints, use of target muscles after min to mod verbal, visual, tactile cues.    Continued working on LE strengthening and balance to help improve ability to ambulate with least restrictive AD as well as decrease fall risk. Pt tolerated session well without aggravation of symptoms.      PT Short Term Goals - 12/13/18 1651      PT SHORT TERM GOAL #1   Title  Patient will be independent with her HEP to improve strength and balance.     Time  3    Period  Weeks    Status  On-going    Target Date  01/03/19  PT Long Term Goals - 12/13/18 1652      PT LONG TERM GOAL #1   Title  Patient will improve her 10 MWT to 0.8 m/s or more  with rw to promote better community ambulation.     Baseline  0.5 seconds with rw (09/06/2018); 0.63 seconds average with rw (10/18/2018); 0.69 m/s average using rw (12/10/2018)    Time  6    Period  Weeks    Status  On-going    Target Date  01/24/19      PT LONG TERM GOAL #2   Title  Patient will improve her DGI score using rw or least restrictive AD to 19/24 or more to promote balance.     Baseline  13/24 using rw (09/06/2018); 14/24 using rw (10/29/2018); 16/24 (12/10/2018)    Time  6    Period  Weeks    Status  On-going    Target Date  01/24/19      PT LONG TERM GOAL #3   Title  Patient will improve bilateral LE strength by at least 1/2 MMT to promote ability to support herself when performing standing tasks and improve balance.     Time  6    Period  Weeks    Status  Partially Met    Target Date  01/24/19      PT LONG TERM GOAL #4   Title  Patient will have a decrease in back pain/ache to 5/10 or less to promote ability to perform standing tasks with less difficulty.    Baseline  8/10 at most for the past 3 months (09/06/2018); 5-6/10 at worst for the past 7 days (10/18/2018); 5/10 at worst for the past 7 days (12/10/2018)    Time  6    Period  Weeks    Status  Achieved    Target Date  01/24/19            Plan - 12/17/18 1358    Clinical Impression Statement  Continued working on LE strengthening and balance to help improve ability to ambulate with least restrictive AD as well as decrease fall risk. Pt tolerated session well without aggravation of symptoms.    Rehab Potential  Fair    Clinical Impairments Affecting Rehab Potential  (-) chronicity of condition, age, comorbidities; (+) motivated    PT Frequency  2x / week    PT Duration  6 weeks    PT Treatment/Interventions  Aquatic Therapy;Gait training;Therapeutic activities;Therapeutic exercise;Balance training;Neuromuscular re-education;Patient/family education;Manual techniques;Dry needling    PT Next Visit Plan   trunk, hip, scapular strengthening, balance, gait, manual techniques PRN    Consulted and Agree with Plan of Care  Patient       Patient will benefit from skilled therapeutic intervention in order to improve the following deficits and impairments:  Abnormal gait, Decreased activity tolerance, Decreased balance, Decreased endurance, Decreased range of motion, Decreased strength, Difficulty walking, Improper body mechanics, Postural dysfunction, Pain, Decreased knowledge of use of DME  Visit Diagnosis: Unsteadiness on feet  Difficulty in walking, not elsewhere classified  Muscle weakness (generalized)  Chronic low back pain, unspecified back pain laterality, unspecified whether sciatica present  History of falling     Problem List Patient Active Problem List   Diagnosis Date Noted  . Septic olecranon bursitis of left elbow 03/27/2018  . Lymphedema 10/11/2017  . Chronic venous insufficiency 10/11/2017  . Leg pain 10/11/2017  . Leg swelling 10/11/2017  . COPD (chronic obstructive pulmonary disease) ([place]) 10/11/2017  .  Essential hypertension 10/11/2017    Joneen Boers PT, DPT   12/17/2018, 4:48 PM  [facility] [address] [place], [address] Phone: [phone]   Fax:  [phone]  Name: [name] MRN: [id number] Date of Birth: [birth year]

## 2018-12-19 ENCOUNTER — Encounter: Payer: Medicare Other | Attending: Internal Medicine | Admitting: Internal Medicine

## 2018-12-19 DIAGNOSIS — M351 Other overlap syndromes: Secondary | ICD-10-CM | POA: Diagnosis not present

## 2018-12-19 DIAGNOSIS — S51002S Unspecified open wound of left elbow, sequela: Secondary | ICD-10-CM | POA: Insufficient documentation

## 2018-12-19 DIAGNOSIS — M069 Rheumatoid arthritis, unspecified: Secondary | ICD-10-CM | POA: Insufficient documentation

## 2018-12-19 DIAGNOSIS — Z950 Presence of cardiac pacemaker: Secondary | ICD-10-CM | POA: Diagnosis not present

## 2018-12-19 DIAGNOSIS — L98495 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: Secondary | ICD-10-CM | POA: Insufficient documentation

## 2018-12-19 DIAGNOSIS — X58XXXS Exposure to other specified factors, sequela: Secondary | ICD-10-CM | POA: Insufficient documentation

## 2018-12-20 ENCOUNTER — Ambulatory Visit: Payer: Medicare Other | Admitting: Occupational Therapy

## 2018-12-20 ENCOUNTER — Ambulatory Visit: Payer: Medicare Other

## 2018-12-20 DIAGNOSIS — G8929 Other chronic pain: Secondary | ICD-10-CM

## 2018-12-20 DIAGNOSIS — R2681 Unsteadiness on feet: Secondary | ICD-10-CM

## 2018-12-20 DIAGNOSIS — M545 Low back pain: Secondary | ICD-10-CM

## 2018-12-20 DIAGNOSIS — S41102S Unspecified open wound of left upper arm, sequela: Secondary | ICD-10-CM

## 2018-12-20 DIAGNOSIS — M6281 Muscle weakness (generalized): Secondary | ICD-10-CM

## 2018-12-20 DIAGNOSIS — Z9181 History of falling: Secondary | ICD-10-CM

## 2018-12-20 DIAGNOSIS — R262 Difficulty in walking, not elsewhere classified: Secondary | ICD-10-CM

## 2018-12-20 NOTE — Progress Notes (Signed)
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 Reasonably small wound but still adherent thick necrotic debris. We have been using Santyl 12/06/17 traumatic wound on the right anterior leg. Small wound but again adherent necrotic debris on the surface 95%. We have been using Santyl 12/13/86; small lright anterior traumatic leg wound. Using Santyl that again with adherent debris perhaps down to 50%. I changed her to Iodoflex today 12/20/17; right anterior leg traumatic wound. She again presents with debris about 50% of the wound. I changed her to Iodoflex last week but so far not a lot in the way of response 12/27/17; right anterior leg traumatic wound. She again presents with debris on the wound although it looks better. She is using Iodoflex entering her third week now. Still requiring debridement 01/16/18 on evaluation today patient seems to be doing fairly well in regard to her right lower extremity ulcer. She has been tolerating the dressing changes without complication. With that being said she does note that she's been having a lot of burning with the current dressing which is specifically the Iodoflex. Obviously this is a known side effect of the iodine in the dressing and I believe that may be giving her trouble. No fevers, chills, nausea, or vomiting noted at this time. Otherwise the wound does appear to be doing well. 01/30/18 on evaluation today patient appears to be doing well in regard to her right anterior lower extremity ulcer. She notes that this does seem to be smaller and she wonders why we did not start the Prisma dressing sooner since it has made  such a big difference in such a short amount of time. I explained that obviously we have to wait for the wound to get to a certain point along his healing path before we can initiate the Prisma otherwise it will not be effective. Therefore once the wound became CREOLA, KROTZ ([id number]) clean it was then time to initiate the Prisma. Nonetheless good news is she is noting excellent improvement she does still have some discomfort but nothing as significant as previously noted. 04/17/18 on evaluation today patient appears to  be doing very well and in fact her right lower extremity ulcer has completely healed at this point I'm pleased with this. The left lower extremity ulcer seem to be doing better although she still does have some openings noted the Prisma I think is helping more than the Xeroform was in my pinion. With that being said she still has a lot of healing to do in this regard. 04/27/18 on evaluation today patient appears to be doing very well in regard to her left lower Trinity ulcers. She has been tolerating the dressing changes without complication. I do have a note from her orthopedic surgeon today and they would like for me to help with treating her left elbow surgery site where she had the bursa removed and this was performed roughly 4 weeks ago according to the note that I reviewed. She has been placed on Bactrim DS by need for her leg wounds this probably helped a little bit with the left elbow surgery site. Obviously I do think this is something we can try to help her out with. 05/04/18 on evaluation today patient appears to be doing well in regard to her left anterior lower Trinity ulcers. She is making good progress which is great news. Unfortunately her elbow which we are also managing at this point in time has not made as much progress unfortunately. She has been tolerating the dressing changes without complication. She did see Dr. Darleen Crocker earlier today and he states that he's  willing to give this three weeks to see if she's making any progress with wound care. However he states that she's really not then he will need to go back in and perform further surgery. Obviously she is trying to avoid surgery if at all possible although I'm not sure if this is going to be possible or least not that quickly. 05/11/18 on evaluation today patient appears to be doing very well in regard to her left lower extremity ulcers. Unfortunately in regard to her elbow this is very slow coming about as far as any improvement is concerned. I do feel like there may be a little bit more granulation noted in the base of the wound but nothing too significant unfortunately. I still can probe bone in the proximal portion of the wound which obviously explain to the patient is not good. She will be having a follow-up with her orthopedic surgeon in the next couple of weeks. In the meantime we are trying to do as much as we can to try to show signs of improvement in healing to avoid the need for any additional and further surgery. Nonetheless I explained to the patient yet again today I'm not sure if that is going to be feasible or not obviously it's more risk for her to continue to have an open wound with bone exposure then to the back in for additional surgery even though I know she doesn't want to go that route. 05/15/18 on evaluation today patient presents for follow-up concerning her ongoing lower extremity ulcers on the left as well as the left elbow ulcer. She has at this point in time been tolerating the dressing changes without complication. Her left lower extremity ulcer appears to be doing very well. In regard to the left elbow ulcer she actually does seem to have additional granulation today which is good news. I am definitely seeing signs of improvement although obviously this is somewhat slow improvement. Nonetheless I'm hopeful we will be able to avoid her having to have any  further surgery but  again that would definitely be a conversation between herself as well as her surgeon once he sees her for reevaluation. Otherwise she does want to see about having a three order compression stockings for her today 05/21/18 on evaluation today patient appears to be doing well in regard to her left lower surety ulcer. This is almost completely healed and seems to be progressing very nicely. With that being said her left elbow is another story. I'm not really convinced in the past three weeks we've seen a significant improvement in this wound. With that being said if this is something that there is no surgical option for him we have to continue to work on this from the standpoint of conservative management with wound care she may make improvement given time. Nonetheless it appears that her surgeon is somewhat concerned about the possibility of infection and really is leaning towards additional surgery to try and help close this wound. Nonetheless the patient is still unsure of exactly what to do. 05/29/18 on evaluation today patient appears to be doing well in regard to her left lower extremity ulcer. She's been tolerating the dressing changes without complication which is good news. With that being said she's been having issues specifically with her elbow she did see her surgeon Dr. Joice Lofts and he is recommending a repeat surgery to the left elbow in order to correct the issue. The patient is still somewhat unsure of this but feels like this may be better than trying to take time to let this heal over a longer period of time through normal wound care measures. Again I explained that I agree this may be a faster way to go if her surgeon feels that this is indeed a good direction to take. Obviously only he can make the judgment on whether or not the surgery would likely be successful. 06/04/18 on evaluation today patient actually presents for follow-up concerning her left elbow and left lower from the ulcer  she seems to be doing very well at this point in time. She has been tolerating the dressing changes without complication. With that being said her elbow is not significantly better she actually is scheduled for surgery tomorrow. 07/04/18; the patient had an area on her left leg that is remaining closed. The open area she has now is a postsurgical wound on the left elbow. I think we have clearance from the surgeon to see this now. [address], [place] V[place] Kitchen ([id number]) 07/11/18; we're currently dealing with a surgical wound on the left olecranon process. The patient complains of a lot of pain and drainage. When I saw her last week we did an x-ray that showed soft tissue wound and probable elbow joint effusion but no erosion to suggest osteomyelitis. The culture I did of this was somewhat surprisingly negative. She has a small open wound with not a viable surface there is considerable undermining relative to the wound size. She is on methotrexate for rheumatoid arthritis/overlap syndrome also plaquenil. We've been using silver collagen 07/18/18-She is seen in follow-up evaluation for a left elbow wound. There is essentially no change. She is currently on Zithromax and will complete that on Friday, there is no indication to extend this. We will change to iodosorb/iodoflex and monitor for response 07/25/18-She is seen in follow-up evaluation for left elbow wound. The wound is stable with no overt evidence of infection. She has counseled with her rheumatologist. She is wanting to restart her methotrexate; a culture was obtained to  rule out occult infection before starting her methotrexate. We will continue with Iodosorb/Iodoflex and she will follow-up next week. 08/01/18; this is a difficult wound over her left olecranon process. There is been concerned about infection although cultures including one done last week were negative. Pending 3 weeks ago I gave her an empiric course of antibiotics. She is  having a lot of rheumatologic pain in her hands with pain and stiffness. She wants to go on her weekly methotrexate and I think it would be reasonable to do so. We have been using Iodoflex 08/01/18; difficult wound over her left olecranon process. She started back on methotrexate last week because of rheumatologic pain in her hands. We have been using Iodoflex to try and clean out the wound bed. She has been approved for Graphix PL 08/15/18; 2 week follow-up. Difficult wound over her left olecranon process. Graphix PL #1 with collagen backing 08/22/18; one-week follow-up. Difficult wound over her left olecranon process. Graphix PL #2 08/29/18; no major improvement. Difficult wound over her left olecranon process. Still considerable undermining. Graphics PL #3 1 week follow-up. Graphix #4 09/12/18 graphics #5. Some improvement in wound area although the undermining superiorly still has not closed down as much as I would like 09/19/18; Graphix #6 I think there is improvement in the undermining from 7 to 9:00. Wound bed looks healthy. 09/26/18 Graffix #7 undermining is 0.5 cm maximally at roughly 8:00. From 12 to 7:00 the tissue is adherent which is a major improvement there is some advancing skin from this side. 10/03/18; Graphix #8 no major changes from last week 10/10/18 Graffix #9 There are improvements. There appears to be granulation coming up to the surface here and there is a lot less undermining at 8:00. 10/17/18. Graffix #10; Dimensions are improved less undermining surface felt the but the wound is still open. Initially a surgical wound following a bursectomy 10/24/18; Graffix #11. This is really stalled over the last 2 weeks. If there is no further improvement this will be the last application.The final option for this difficult area would be plastic surgery and will set up a consult with Dr. Marina Goodell in [facility] 10/31/18; wound looks about the same. The undermining superiorly is 0.7 cm.  On the lateral edges perhaps some improvement there is no drainage. 11-07-2018 patient seen today for follow-up and management of left elbow wound. She has completed a total of 11 treatments of the graffix with not much improvement. She has an upcoming appointment with plastic surgery to assist with additional treatment options for the left elbow wound on 11/19/18. There is significant amount surrounding undermining of the wound is 0.9 cm. Currently prescribed methotrexate. Wound is being treated with Indoform and border dressing. No drainage from wound. No fever, chills. or pain. 11/21/18; the patient continues to have the wound looking roughly the same with undermining from about 12 to 6:00. This has not changed all that much. She does have skin irritation around the wound that looks like drainage maceration issues. The patient states that she was not able to have her wound dressing changed because of illness in the person he usually does this. She also did not attend her clinic appointment today with Dr. Marina Goodell because of transportation issues. She is rebooked for some time in mid January 11/28/2018; the patient has less undermining using endoform. As a understandings she saw Dr. Thad Ranger who is Dr. Lonni Fix partner. He recommended putting her in a elbow brace and I believe is written a prescription for it. He  also recommended Motrin 800 mg 3 times daily. This is prescription strength ibuprofen although he did not write his prescription. This apparently was for 2 weeks. Culture I did last time grew a few methicillin sensitive staph aureus. After some difficulty due to drug intolerances/allergies and drug interactions I settled on a 5-day course of azithromycin 12/03/18 on evaluation today patient actually appears to be doing fairly well in regard to her elbow when compared to last time I evaluated her. With that being said there does not appear to be any signs of infection at this time. That  was the big concern currently as far as the patient was concerned. Nonetheless I do feel like she is making progress in regard to the feeling of this ulcer it has been slow. She did see a Engineer, petroleum they are talking about putting her in a brace in order to allow this area to heal more appropriately. NERIAH, BROTT ([id number]) 12/19/2018; not much change in this from the last time I have saw this.'s much smaller area than when she first came in and with less circumferential undermining however this is never really adhered. She is wearing the brace that was given or prescribed to her by plastics. She did not have a procedure offered to attempt to close this. We have been using endoform Objective Constitutional Sitting or standing Blood Pressure is within target range for patient.. Pulse regular and within target range for patient.Marland Kitchen Respirations regular, non-labored and within target range.. Temperature is normal and within the target range for the patient.Marland Kitchen appears in no distress. Vitals Time Taken: 11:42 AM, Height: 60 in, Weight: 123 lbs, BMI: 24, Temperature: 97.8 F, Pulse: 66 bpm, Respiratory Rate: 18 breaths/min, Blood Pressure: 139/46 mmHg. General Notes: systolic B/P 46 NAD,NVC bt pt. General Notes: Wound exam; certain amount of surface slough on this but I did not debride. There is no evidence of surrounding infection. I think the undermining is probably about the same from about 4-10 o'clock. Integumentary (Hair, Skin) No primary skin issue is seen. Wound #9 status is Open. Original cause of wound was Trauma. The wound is located on the Left Elbow. The wound measures 0.8cm length x 0.5cm width x 0.2cm depth; 0.314cm^2 area and 0.063cm^3 volume. There is Fat Layer (Subcutaneous Tissue) Exposed exposed. There is no tunneling noted, however, there is undermining starting at 6:00 and ending at 1:00 with a maximum distance of 0.3cm. There is a medium amount of purulent drainage noted.  The wound margin is distinct with the outline attached to the wound base. There is large (67-100%) pink granulation within the wound bed. There is a small (1-33%) amount of necrotic tissue within the wound bed including Adherent Slough. The periwound skin appearance exhibited: Maceration, Erythema. The periwound skin appearance did not exhibit: Callus, Crepitus, Excoriation, Induration, Rash, Scarring, Dry/Scaly, Atrophie Blanche, Cyanosis, Ecchymosis, Hemosiderin Staining, Mottled, Pallor, Rubor. The surrounding wound skin color is noted with erythema which is circumferential. Periwound temperature was noted as No Abnormality. The periwound has tenderness on palpation. 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 Anesthetic (add to Medication List): Wound #9 Left Elbow: Topical Lidocaine 4% cream applied to wound bed prior to debridement (In Clinic Only). Skin Barriers/Peri-Wound Care: Wound #9 Left Elbow: Skin Prep Primary Wound Dressing: Wound #9 Left Elbow:  Other: - Endoform Secondary Dressing: Wound #9 Left Elbow: Boardered Foam Dressing Dressing Change Frequency: Wound #9 Left Elbow: Change dressing every other day. Follow-up Appointments: Wound #9 Left Elbow: Return Appointment in 2 weeks. General Notes: PuraPly authorization 1. Left elbow I am continuing endoform AG. I am going to look at Puraply through her insurance. The idea would to see if I can get a proper matrix to support more granulation. 2. There is no evidence of infection Electronic Signature(s) Signed: 12/19/2018 5:09:58 PM By: Baltazar Najjarobson, Michael MD Entered By: Baltazar Najjarobson, Michael on 12/19/2018 13:27:58 [name], Alexsa V.  ([id number]) -------------------------------------------------------------------------------- SuperBill Details Patient Name: [name], Candiace V. Date of Service: 12/19/2018 Medical Record Number: [id number] Patient Account Number: [id number] Date of Birth/Sex: 07/13/1930 (85 y.o. 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

## 2018-12-20 NOTE — Progress Notes (Signed)
[name], Margert V. ([id number]) Visit Report for 12/19/2018 Arrival Information Details Patient Name: [name], [name] Date of Service: 12/19/2018 11:15 AM Medical Record Number: [id number] Patient Account Number: [id number] Date of Birth/Sex: 11/22/1930 (85 y.o. F) Treating RN: Rema JasmineNg, Wendi Primary Care Michall Noffke: Aram BeechamSparks, Jeffrey Other Clinician: Referring Ladonna Vanorder: Aram BeechamSparks, Jeffrey Treating Kaytelyn Glore/Extender: Altamese CarolinaOBSON, [name] Weeks in Treatment: 6057 Visit Information History Since Last Visit Added or deleted any medications: No Patient Arrived: Walker Any new allergies or adverse reactions: No Arrival Time: 11:39 Had a fall or experienced change in No Accompanied By: self activities of daily living that may affect Transfer Assistance: None risk of falls: Patient Identification Verified: Yes Signs or symptoms of abuse/neglect since last visito No Patient Requires Transmission-Based No Hospitalized since last visit: No Precautions: Implantable device outside of the clinic excluding No Patient Has Alerts: Yes cellular tissue based products placed in the center Patient Alerts: non compressible left since last visit: leg Has Dressing in Place as Prescribed: Yes Pain Present Now: No Electronic Signature(s) Signed: 12/19/2018 4:13:45 PM By: Rema JasmineNg, Wendi Entered By: Rema JasmineNg, Wendi on 12/19/2018 11:40:27 [name], [name] ([id number]) -------------------------------------------------------------------------------- Clinic Level of Care Assessment Details Patient Name: [name], Misao V. Date of Service: 12/19/2018 11:15 AM Medical Record Number: [id number] Patient Account Number: [id number] Date of Birth/Sex: 09/02/1930 (85 y.o. F) Treating RN: Curtis Sitesorthy, Joanna Primary Care Amit Meloy: Aram BeechamSparks, Jeffrey Other Clinician: Referring Sarim Rothman: Aram BeechamSparks, Jeffrey Treating Lilo Wallington/Extender: Altamese CarolinaOBSON, [name] Weeks in Treatment: 557 Clinic Level of Care Assessment Items TOOL 4 Quantity Score []  - Use when only an EandM  is performed on FOLLOW-UP visit 0 ASSESSMENTS - Nursing Assessment / Reassessment X - Reassessment of Co-morbidities (includes updates in patient status) 1 10 X- 1 5 Reassessment of Adherence to Treatment Plan ASSESSMENTS - Wound and Skin Assessment / Reassessment X - Simple Wound Assessment / Reassessment - one wound 1 5 []  - 0 Complex Wound Assessment / Reassessment - multiple wounds []  - 0 Dermatologic / Skin Assessment (not related to wound area) ASSESSMENTS - Focused Assessment []  - Circumferential Edema Measurements - multi extremities 0 []  - 0 Nutritional Assessment / Counseling / Intervention []  - 0 Lower Extremity Assessment (monofilament, tuning fork, pulses) []  - 0 Peripheral Arterial Disease Assessment (using hand held doppler) ASSESSMENTS - Ostomy and/or Continence Assessment and Care []  - Incontinence Assessment and Management 0 []  - 0 Ostomy Care Assessment and Management (repouching, etc.) PROCESS - Coordination of Care X - Simple Patient / Family Education for ongoing care 1 15 []  - 0 Complex (extensive) Patient / Family Education for ongoing care X- 1 10 Staff obtains ChiropractorConsents, Records, Test Results / Process Orders []  - 0 Staff telephones HHA, Nursing Homes / Clarify orders / etc []  - 0 Routine Transfer to another Facility (non-emergent condition) []  - 0 Routine Hospital Admission (non-emergent condition) []  - 0 New Admissions / Manufacturing engineernsurance Authorizations / Ordering NPWT, Apligraf, etc. []  - 0 Emergency Hospital Admission (emergent condition) X- 1 10 Simple Discharge Coordination [name], [name] ([id number]) []  - 0 Complex (extensive) Discharge Coordination PROCESS - Special Needs []  - Pediatric / Minor Patient Management 0 []  - 0 Isolation Patient Management []  - 0 Hearing / Language / Visual special needs []  - 0 Assessment of Community assistance (transportation, D/C planning, etc.) []  - 0 Additional assistance / Altered mentation []  -  0 Support Surface(s) Assessment (bed, cushion, seat, etc.) INTERVENTIONS - Wound Cleansing / Measurement X - Simple Wound Cleansing - one wound 1 5 []  - 0 Complex  Wound Cleansing - multiple wounds X- 1 5 Wound Imaging (photographs - any number of wounds) []  - 0 Wound Tracing (instead of photographs) X- 1 5 Simple Wound Measurement - one wound []  - 0 Complex Wound Measurement - multiple wounds INTERVENTIONS - Wound Dressings X - Small Wound Dressing one or multiple wounds 1 10 []  - 0 Medium Wound Dressing one or multiple wounds []  - 0 Large Wound Dressing one or multiple wounds []  - 0 Application of Medications - topical []  - 0 Application of Medications - injection INTERVENTIONS - Miscellaneous []  - External ear exam 0 []  - 0 Specimen Collection (cultures, biopsies, blood, body fluids, etc.) []  - 0 Specimen(s) / Culture(s) sent or taken to Lab for analysis []  - 0 Patient Transfer (multiple staff / Nurse, adult / Similar devices) []  - 0 Simple Staple / Suture removal (25 or less) []  - 0 Complex Staple / Suture removal (26 or more) []  - 0 Hypo / Hyperglycemic Management (close monitor of Blood Glucose) []  - 0 Ankle / Brachial Index (ABI) - do not check if billed separately X- 1 5 Vital Signs [name], [name] ([id number]) Has the patient been seen at the hospital within the last three years: Yes Total Score: 85 Level Of Care: New/Established - Level 3 Electronic Signature(s) Signed: 12/19/2018 5:48:26 PM By: Curtis Sites Entered By: Curtis Sites on 12/19/2018 12:09:32 [name] ([id number]) -------------------------------------------------------------------------------- Encounter Discharge Information Details Patient Name: [name] Date of Service: 12/19/2018 11:15 AM Medical Record Number: [id number] Patient Account Number: [id number] Date of Birth/Sex: 1930/11/13 (85 y.o. F) Treating RN: Curtis Sites Primary Care Nevelyn [name]: Aram Beecham Other  Clinician: Referring Leigha Olberding: Aram Beecham Treating Omaira Mellen/Extender: Altamese [place] in Treatment: 65 Encounter Discharge Information Items Discharge Condition: Stable Ambulatory Status: Walker Discharge Destination: Home Transportation: Private Auto Accompanied By: self Schedule Follow-up Appointment: Yes Clinical Summary of Care: Electronic Signature(s) Signed: 12/19/2018 5:48:26 PM By: Curtis Sites Entered By: Curtis Sites on 12/19/2018 12:10:22 [name] ([id number]) -------------------------------------------------------------------------------- Lower Extremity Assessment Details Patient Name: [name] Date of Service: 12/19/2018 11:15 AM Medical Record Number: [id number] Patient Account Number: [id number] Date of Birth/Sex: 1930-05-31 (85 y.o. F) Treating RN: Rema [name] Primary Care Allisen Pidgeon: Aram Beecham Other Clinician: Referring Jeania Nater: Aram Beecham Treating Mykel Sponaugle/Extender: Altamese [place] in Treatment: 12 Electronic Signature(s) Signed: 12/19/2018 4:13:45 PM By: Rema [name] Entered By: Rema [name] on 12/19/2018 11:43:15 [name] ([id number]) -------------------------------------------------------------------------------- Multi Wound Chart Details Patient Name: [name] Date of Service: 12/19/2018 11:15 AM Medical Record Number: [id number] Patient Account Number: [id number] Date of Birth/Sex: [birth year] (85 y.o. F) Treating RN: Curtis Sites Primary Care Taleeyah Bora: Aram Beecham Other Clinician: Referring Teresina Bugaj: Aram Beecham Treating Japheth Diekman/Extender: Altamese [place] in Treatment: 53 Vital Signs Height(in): 60 Pulse(bpm): 66 Weight(lbs): 123 Blood Pressure(mmHg): 139/46 Body Mass Index(BMI): 24 Temperature(F): 97.8 Respiratory Rate 18 (breaths/min): Photos: [N/A:N/A] Wound Location: Left Elbow N/A N/A Wounding Event: Trauma N/A N/A Primary Etiology: Trauma, Other N/A N/A Comorbid  History: Cataracts, Glaucoma, Optic N/A N/A Neuritis, Chronic sinus problems/congestion, Middle ear problems Date Acquired: 01/26/2018 N/A N/A Weeks of Treatment: 33 N/A N/A Wound Status: Open N/A N/A Measurements L x W x D 0.8x0.5x0.2 N/A N/A (cm) Area (cm) : 0.314 N/A N/A Volume (cm) : 0.063 N/A N/A % Reduction in Area: -342.30% N/A N/A % Reduction in Volume: -350.00% N/A N/A Starting Position 1 6 (o'clock): Ending Position 1 1 (o'clock): Maximum Distance 1 (cm): 0.3 Undermining: Yes N/A N/A  Classification: Full Thickness With Exposed N/A N/A Support Structures Exudate Amount: Medium N/A N/A Exudate Type: Purulent N/A N/A Exudate Color: yellow, brown, green N/A N/A Wound Margin: Distinct, outline attached N/A N/A Granulation Amount: Large (67-100%) N/A N/A AMOREENA, NEUBERT ([id number]) Granulation Quality: Pink N/A N/A Necrotic Amount: Small (1-33%) N/A N/A Exposed Structures: Fat Layer (Subcutaneous N/A N/A Tissue) Exposed: Yes Fascia: No Tendon: No Muscle: No Joint: No Bone: No Epithelialization: None N/A N/A Periwound Skin Texture: Excoriation: No N/A N/A Induration: No Callus: No Crepitus: No Rash: No Scarring: No Periwound Skin Moisture: Maceration: Yes N/A N/A Dry/Scaly: No Periwound Skin Color: Erythema: Yes N/A N/A Atrophie Blanche: No Cyanosis: No Ecchymosis: No Hemosiderin Staining: No Mottled: No Pallor: No Rubor: No Erythema Location: Circumferential N/A N/A Temperature: No Abnormality N/A N/A Tenderness on Palpation: Yes N/A N/A Wound Preparation: Ulcer Cleansing: N/A N/A Rinsed/Irrigated with Saline Topical Anesthetic Applied: Other: lidocaine 4% Treatment Notes Wound #9 (Left Elbow) Notes endoform, border foam dressing Electronic Signature(s) Signed: 12/19/2018 5:09:58 PM By: Baltazar Najjar MD Entered By: Baltazar Najjar on 12/19/2018 13:23:31 [name]  ([id number]) -------------------------------------------------------------------------------- Multi-Disciplinary Care Plan Details Patient Name: [name] Date of Service: 12/19/2018 11:15 AM Medical Record Number: [id number] Patient Account Number: [id number] Date of Birth/Sex: May 11, 1930 (85 y.o. F) Treating RN: Curtis Sites Primary Care Cici Rodriges: Aram Beecham Other Clinician: Referring Dona Walby: Aram Beecham Treating Linville Decarolis/Extender: Altamese [place] in Treatment: 89 Active Inactive Soft Tissue Infection Nursing Diagnoses: Impaired tissue integrity Goals: Patient's soft tissue infection will resolve Date Initiated: 07/18/2018 Target Resolution Date: 07/27/2018 Goal Status: Active Interventions: Assess signs and symptoms of infection every visit Treatment Activities: Culture and sensitivity : 07/18/2018 Systemic antibiotics : 07/18/2018 Notes: Wound/Skin Impairment Nursing Diagnoses: Impaired tissue integrity Knowledge deficit related to ulceration/compromised skin integrity Goals: Patient/caregiver will verbalize understanding of skin care regimen Date Initiated: 11/14/2017 Target Resolution Date: 11/28/2017 Goal Status: Active Ulcer/skin breakdown will have a volume reduction of 30% by week 4 Date Initiated: 11/14/2017 Target Resolution Date: 11/28/2017 Goal Status: Active Interventions: Assess patient/caregiver ability to obtain necessary supplies Assess patient/caregiver ability to perform ulcer/skin care regimen upon admission and as needed Assess ulceration(s) every visit Treatment Activities: Skin care regimen initiated : 11/14/2017 JENNINGS, STIRLING ([id number]) Notes: Electronic Signature(s) Signed: 12/19/2018 5:48:26 PM By: Curtis Sites Entered By: Curtis Sites on 12/19/2018 12:07:56 [name] ([id number]) -------------------------------------------------------------------------------- Pain Assessment Details Patient Name: [name] Date of Service: 12/19/2018 11:15 AM Medical Record Number: [id number] Patient Account Number: [id number] Date of Birth/Sex: [birth year] (85 y.o. F) Treating RN: Rema [name] Primary Care Rodric Punch: Aram Beecham Other Clinician: Referring Elzie Sheets: Aram Beecham Treating Enma Maeda/Extender: Altamese [place] in Treatment: 46 Active Problems Location of Pain Severity and Description of Pain Patient Has Paino No Site Locations Rate the pain. Current Pain Level: 9 Character of Pain Describe the Pain: Dull Pain Management and Medication Current Pain Management: Notes pt stated pain to wound site 9/10. enc. to see primary for pain management PRN Electronic Signature(s) Signed: 12/19/2018 4:13:45 PM By: Rema [name] Entered By: Rema [name] on 12/19/2018 11:42:37 [name] ([id number]) -------------------------------------------------------------------------------- Patient/Caregiver Education Details Patient Name: [name] Date of Service: 12/19/2018 11:15 AM Medical Record Number: [id number] Patient Account Number: [id number] Date of Birth/Gender: Jun 23, 1930 (85 y.o. F) Treating RN: Curtis Sites Primary Care Physician: Aram Beecham Other Clinician: Referring Physician: Aram Beecham Treating Physician/Extender: Altamese [place] in Treatment: 73 Education Assessment Education Provided To: Patient Education Topics Provided Wound/Skin Impairment:  Handouts: Other: wound care to continue as ordered Methods: Demonstration, Explain/Verbal Responses: State content correctly Electronic Signature(s) Signed: 12/19/2018 5:48:26 PM By: Curtis Sitesorthy, Joanna Entered By: Curtis Sitesorthy, Joanna on 12/19/2018 12:09:53 [name], [name] ([id number]) -------------------------------------------------------------------------------- Wound Assessment Details Patient Name: [name], Stephonie V. Date of Service: 12/19/2018 11:15 AM Medical Record Number: [id number] Patient Account Number:  [id number] Date of Birth/Sex: 01/13/1930 (85 y.o. F) Treating RN: Rema JasmineNg, Wendi Primary Care Adriana Lina: Aram BeechamSparks, Jeffrey Other Clinician: Referring Bartosz Luginbill: Aram BeechamSparks, Jeffrey Treating Kyonna Frier/Extender: Altamese CarolinaOBSON, [name] Weeks in Treatment: 7457 Wound Status Wound Number: 9 Primary Trauma, Other Etiology: Wound Location: Left Elbow Wound Open Wounding Event: Trauma Status: Date Acquired: 01/26/2018 Comorbid Cataracts, Glaucoma, Optic Neuritis, Chronic Weeks Of Treatment: 33 History: sinus problems/congestion, Middle ear problems Clustered Wound: No Photos Photo Uploaded By: Rema JasmineNg, Wendi on 12/19/2018 11:55:25 Wound Measurements Length: (cm) 0.8 Width: (cm) 0.5 Depth: (cm) 0.2 Area: (cm) 0.314 Volume: (cm) 0.063 % Reduction in Area: -342.3% % Reduction in Volume: -350% Epithelialization: None Tunneling: No Undermining: Yes Starting Position (o'clock): 6 Ending Position (o'clock): 1 Maximum Distance: (cm) 0.3 Wound Description Full Thickness With Exposed Support Foul Odo Classification: Structures Slough/F Wound Margin: Distinct, outline attached Exudate Medium Amount: Exudate Type: Purulent Exudate Color: yellow, brown, green r After Cleansing: No ibrino No Wound Bed Granulation Amount: Large (67-100%) Exposed Structure Granulation Quality: Pink Fascia Exposed: No Necrotic Amount: Small (1-33%) Fat Layer (Subcutaneous Tissue) Exposed: Yes [name], Laterria V. ([id number]) Necrotic Quality: Adherent Slough Tendon Exposed: No Muscle Exposed: No Joint Exposed: No Bone Exposed: No Periwound Skin Texture Texture Color No Abnormalities Noted: No No Abnormalities Noted: No Callus: No Atrophie Blanche: No Crepitus: No Cyanosis: No Excoriation: No Ecchymosis: No Induration: No Erythema: Yes Rash: No Erythema Location: Circumferential Scarring: No Hemosiderin Staining: No Mottled: No Moisture Pallor: No No Abnormalities Noted: No Rubor: No Dry / Scaly:  No Maceration: Yes Temperature / Pain Temperature: No Abnormality Tenderness on Palpation: Yes Wound Preparation Ulcer Cleansing: Rinsed/Irrigated with Saline Topical Anesthetic Applied: Other: lidocaine 4%, Electronic Signature(s) Signed: 12/19/2018 4:13:45 PM By: Rema JasmineNg, Wendi Entered By: Rema JasmineNg, Wendi on 12/19/2018 11:49:47 [name], Varnika V. ([id number]) -------------------------------------------------------------------------------- Vitals Details Patient Name: [name], Angellica V. Date of Service: 12/19/2018 11:15 AM Medical Record Number: [id number] Patient Account Number: [id number] Date of Birth/Sex: 07/15/1930 (85 y.o. F) Treating RN: Rema JasmineNg, Wendi Primary Care Joniya Boberg: Aram BeechamSparks, Jeffrey Other Clinician: Referring Loveta Dellis: Aram BeechamSparks, Jeffrey Treating Dustee Bottenfield/Extender: Altamese CarolinaOBSON, [name] Weeks in Treatment: 7857 Vital Signs Time Taken: 11:42 Temperature (F): 97.8 Height (in): 60 Pulse (bpm): 66 Weight (lbs): 123 Respiratory Rate (breaths/min): 18 Body Mass Index (BMI): 24 Blood Pressure (mmHg): 139/46 Reference Range: 80 - 120 mg / dl Notes systolic B/P 46 NAD,NVC bt pt. Electronic Signature(s) Signed: 12/19/2018 4:13:45 PM By: Rema JasmineNg, Wendi Entered By: Rema JasmineNg, Wendi on 12/19/2018 11:44:03

## 2018-12-20 NOTE — Therapy (Signed)
[facility] [address] [place], [address] Phone: [phone]   Fax:  [phone]  Occupational Therapy Treatment  Patient Details  Name: [name] MRN: [id number] Date of Birth: 02-May-1930 Referring Provider (OT): Thad Ranger   Encounter Date: 12/20/2018    Past Medical History:  Diagnosis Date  . Anemia   . GERD (gastroesophageal reflux disease)   . Hypertension   . Peripheral vascular disease (HCC)    possible neuropathies in lower extremeties  . PONV (postoperative nausea and vomiting)    happens sometimes but better with pre med of zofran  . Presence of permanent cardiac pacemaker   . Syncope     Past Surgical History:  Procedure Laterality Date  . ABDOMINAL HYSTERECTOMY  1969  . APPENDECTOMY  1969   with hysterectomy  . BACK SURGERY  2001   rods in back. surgery on back x 3  . COLONOSCOPY    . I&D EXTREMITY Left 03/27/2018   Procedure: IRRIGATION AND DEBRIDEMENT LEFT ELBOW / OLECRANON BURSA;  Surgeon: Christena Flake, MD;  Location: [facility] ORS;  Service: Orthopedics;  Laterality: Left;  . I&D EXTREMITY Left 06/05/2018   Procedure: IRRIGATION AND DEBRIDEMENT EXTREMITY;  Surgeon: Christena Flake, MD;  Location: [facility] ORS;  Service: Orthopedics;  Laterality: Left;  . INSERT / REPLACE / REMOVE PACEMAKER  2004  . JOINT REPLACEMENT Left 2004   partial knee replacement  . OLECRANON BURSECTOMY Left 03/27/2018   Procedure: LEFT OLECRANON BURSA;  Surgeon: Christena Flake, MD;  Location: [facility] OR[facility];  Service: Orthopedics;  Laterality: Left;  . PACEMAKER INSERTION  2014   dual chamber for complete heart block    There were no vitals filed for this visit.  Subjective Assessment - 12/20/18 1140    Subjective   The splint is pushing into my upper arm and causing some brusing - and bother me at the base of my thumb     Patient Stated Goals  Need elbow splint to keep my elbow splint to get wound to heal     Currently in Pain?  No/denies          Pt wearing splint over her sleeve to protect her skin and it is winter- provided her soft foam last week while seeing Pt - for protection at distal edge of splint - it is pushing against her CMC - and using bandaid - doing better  This date pt report that top edge of splint pushing into her upper arm -and causing some brusing. Rolled the proximal edge of splint and did yellow soft foam on edge under moldskin to allow less pressure area at upper arm - pt report it feels better She seen would care specialist -and they report that the dept of wound did not change the last 10 days        cont to Keep elbow splint on at all times per MD order - but can take off 2-3 x day for AROM pain free for elbow flexion and extention  10 -15 reps            OT Education - 12/20/18 1141    Education Details  splint wearing and precautions     Person(s) Educated  Patient    Methods  Explanation;Demonstration;Verbal cues    Comprehension  Verbalized understanding;Returned demonstration          OT Long Term Goals - 12/10/18 1511      OT LONG  TERM GOAL #1   Title  Pt to be ind in donning , doffing and wearing of elbow splint     Baseline  needed min A and verbal cues - but able to demo in clinic     Time  2    Period  Weeks    Status  New    Target Date  12/24/18              Patient will benefit from skilled therapeutic intervention in order to improve the following deficits and impairments:     Visit Diagnosis: Wound of left upper extremity, sequela    Problem List Patient Active Problem List   Diagnosis Date Noted  . Septic olecranon bursitis of left elbow 03/27/2018  . Lymphedema 10/11/2017  . Chronic venous insufficiency 10/11/2017  . Leg pain 10/11/2017  . Leg swelling 10/11/2017  . COPD (chronic obstructive pulmonary disease) (HCC) 10/11/2017  . Essential hypertension 10/11/2017    [name]  OTR/L,CLT 12/20/2018, 11:42 AM   [facility] [address] [place], [address] Phone: [phone]   Fax:  [phone]  Name: [name] MRN: [id number] Date of Birth: 07-22-1930

## 2018-12-20 NOTE — Therapy (Signed)
[facility] [address], [place], [address] Phone: [phone]   Fax:  [phone]  Physical Therapy Treatment  Patient Details  Name: DEONDRIA PURYEAR MRN: [id number] Date of Birth: [birth year] Referring Provider (PT): Jennings Books, MD   Encounter Date: 12/20/2018  PT End of Session - 12/20/18 1035    Visit Number  21    Number of Visits  45    Date for PT Re-Evaluation  01/24/19    Authorization Type  2    Authorization Time Period  of 10 progress report    PT Start Time  1036    PT Stop Time  1117    PT Time Calculation (min)  41 min    Equipment Utilized During Treatment  Gait belt    Activity Tolerance  Patient tolerated treatment well    Behavior During Therapy  WFL for tasks assessed/performed       Past Medical History:  Diagnosis Date  . Anemia   . GERD (gastroesophageal reflux disease)   . Hypertension   . Peripheral vascular disease (HCC)    possible neuropathies in lower extremeties  . PONV (postoperative nausea and vomiting)    happens sometimes but better with pre med of zofran  . Presence of permanent cardiac pacemaker   . Syncope     Past Surgical History:  Procedure Laterality Date  . ABDOMINAL HYSTERECTOMY  1969  . APPENDECTOMY  1969   with hysterectomy  . BACK SURGERY  2001   rods in back. surgery on back x 3  . COLONOSCOPY    . I&D EXTREMITY Left 03/27/2018   Procedure: IRRIGATION AND DEBRIDEMENT LEFT ELBOW / OLECRANON BURSA;  Surgeon: Corky Mull, MD;  Location: [facility];  Service: Orthopedics;  Laterality: Left;  . I&D EXTREMITY Left 06/05/2018   Procedure: IRRIGATION AND DEBRIDEMENT EXTREMITY;  Surgeon: Corky Mull, MD;  Location: [facility];  Service: Orthopedics;  Laterality: Left;  . INSERT / REPLACE / REMOVE PACEMAKER  2004  . JOINT REPLACEMENT Left 2004   partial knee replacement  . OLECRANON BURSECTOMY Left 03/27/2018   Procedure: LEFT OLECRANON BURSA;  Surgeon:  Corky Mull, MD;  Location: [facility];  Service: Orthopedics;  Laterality: Left;  . PACEMAKER INSERTION  2014   dual chamber for complete heart block    There were no vitals filed for this visit.  Subjective Assessment - 12/20/18 1041    Subjective  Has a hard time getting going in the morning. Not sure if she notices a great deal of improvement in her walking and strength. Back doing ok. Brace on her elbow is bothering her.     Pertinent History  Imparied gait. Pt states that she has had balance issues for too long. Feels more comfortable walking with her rw. Had tests which showed that she has neuropathy. However the doctor at [facility] said that her balance is coming from her back. Just wants to be strong so she can walk.  Started walking with her rw off and on for the past several months.  Also feels like she might need a new sleep number bed. Also had 2 wound surgeries at [name]. Pt fell in February 2019. Had to stop PT due to the L elbow surgery April 06, 2018 to remove a bursa. The wound still has not healed.   Currently sees a wound doctor.   Feels numbness L foot from the ankle down.  No R foot numbness.  Sometimes unsure of where her L LE is in space.  Pt also states that she tires too quickly.  Has not been able to walk her dog in 2 years. Has not really been doing exercises at home except the sit <> stand. Pt states that prolonged sitting without back support will increase back pain     Limitations  Standing;Walking    How long can you sit comfortably?  1 hr    How long can you stand comfortably?  10 minutes    How long can you walk comfortably?  2 or 3 blocks    Diagnostic tests  EMG testing BLEs and BUEs which were negative    Patient Stated Goals  Improve LE strength. Wants to use the machines at the gym. Walk more steadily. Be able to take her dog (16 lbs) out on a leash.     Currently in Pain?  No/denies    Pain Score  0-No pain    Pain Onset  More than a month ago                                PT Education - 12/20/18 1045    Education provided  Yes    Education Details  ther-ex    [employer]) Educated  Patient    Methods  Explanation;Demonstration;Tactile cues;Verbal cues    Comprehension  Returned demonstration;Verbalized understanding      Objectives  Pt ambulating with rw  TTP bilateral greater trochanter L >R   L elbow wound  No latex band allergies   MedbridgeAccess Code: [email or web address]   Ther-ex  CGAto SBAwith standing activities.   LAQ 2 lbs 10x2 each LE  Then 3 lbs 10x each LE (upgrade weight suggesting improved strength)  Seated manually resisted trunk flexion isometrics in neutral 10x5 second for 3 sets  Gait with R UE use of rw 50 ft, then with SPC on R 125 ft, CGA, cues to keep SPC closer and to utilize it to help advance L LE.   Pt demonstrates fear of falling. Improved ability to place and maintain her center of gravity over her base of support and improved technique with use of SPC when pt feels safe.   Gait with SPC 200 ft, CGA from PT   standing hip abduction with B UE assist with 2 lbs              R 10x3             L 10x3  Seated bilateral scapular retraction 10x5 seconds for 2 sets to help decrease R shoulder pain as well as promote thoracic extension to decrease low back extension pressure  Improved exercise technique, movement at target joints, use of target muscles after min to mod verbal, visual, tactile cues.    Pt demonstrates fear of falling. Improved ability to place and maintain her center of gravity over her base of support and improved technique with use of SPC when pt feels safe while walking. Continued working on trunk and hip strengthening to help decrease extension pressure to her low back with standing activities. Pt tolerated session well without aggravation of symptoms. Pt will benefit from continued skilled physical therapy services to  improve strength, function, and ability to ambulate.        PT Short Term Goals - 12/13/18 1651      PT SHORT TERM GOAL #1  Title  Patient will be independent with her HEP to improve strength and balance.     Time  3    Period  Weeks    Status  On-going    Target Date  01/03/19        PT Long Term Goals - 12/13/18 1652      PT LONG TERM GOAL #1   Title  Patient will improve her 10 MWT to 0.8 m/s or more with rw to promote better community ambulation.     Baseline  0.5 seconds with rw (09/06/2018); 0.63 seconds average with rw (10/18/2018); 0.69 m/s average using rw (12/10/2018)    Time  6    Period  Weeks    Status  On-going    Target Date  01/24/19      PT LONG TERM GOAL #2   Title  Patient will improve her DGI score using rw or least restrictive AD to 19/24 or more to promote balance.     Baseline  13/24 using rw (09/06/2018); 14/24 using rw (10/29/2018); 16/24 (12/10/2018)    Time  6    Period  Weeks    Status  On-going    Target Date  01/24/19      PT LONG TERM GOAL #3   Title  Patient will improve bilateral LE strength by at least 1/2 MMT to promote ability to support herself when performing standing tasks and improve balance.     Time  6    Period  Weeks    Status  Partially Met    Target Date  01/24/19      PT LONG TERM GOAL #4   Title  Patient will have a decrease in back pain/ache to 5/10 or less to promote ability to perform standing tasks with less difficulty.    Baseline  8/10 at most for the past 3 months (09/06/2018); 5-6/10 at worst for the past 7 days (10/18/2018); 5/10 at worst for the past 7 days (12/10/2018)    Time  6    Period  Weeks    Status  Achieved    Target Date  01/24/19            Plan - 12/20/18 1047    Clinical Impression Statement  Pt demonstrates fear of falling. Improved ability to place and maintain her center of gravity over her base of support and improved technique with use of SPC when pt feels safe while walking.  Continued working on trunk and hip strengthening to help decrease extension pressure to her low back with standing activities. Pt tolerated session well without aggravation of symptoms. Pt will benefit from continued skilled physical therapy services to improve strength, function, and ability to ambulate.     Rehab Potential  Fair    Clinical Impairments Affecting Rehab Potential  (-) chronicity of condition, age, comorbidities; (+) motivated    PT Frequency  2x / week    PT Duration  6 weeks    PT Treatment/Interventions  Aquatic Therapy;Gait training;Therapeutic activities;Therapeutic exercise;Balance training;Neuromuscular re-education;Patient/family education;Manual techniques;Dry needling    PT Next Visit Plan  trunk, hip, scapular strengthening, balance, gait, manual techniques PRN    Consulted and Agree with Plan of Care  Patient       Patient will benefit from skilled therapeutic intervention in order to improve the following deficits and impairments:  Abnormal gait, Decreased activity tolerance, Decreased balance, Decreased endurance, Decreased range of motion, Decreased strength, Difficulty walking, Improper body mechanics, Postural dysfunction, Pain, Decreased  knowledge of use of DME  Visit Diagnosis: Unsteadiness on feet  Difficulty in walking, not elsewhere classified  Muscle weakness (generalized)  History of falling  Chronic low back pain, unspecified back pain laterality, unspecified whether sciatica present     Problem List Patient Active Problem List   Diagnosis Date Noted  . Septic olecranon bursitis of left elbow 03/27/2018  . Lymphedema 10/11/2017  . Chronic venous insufficiency 10/11/2017  . Leg pain 10/11/2017  . Leg swelling 10/11/2017  . COPD (chronic obstructive pulmonary disease) ([place]) 10/11/2017  . Essential hypertension 10/11/2017    Joneen Boers PT, DPT   12/20/2018, 5:46 PM  [facility] [address] [place], [address] Phone: [phone]   Fax:  [phone]  Name: RORIE DELMORE MRN: [id number] Date of Birth: August 26, 1930

## 2018-12-24 ENCOUNTER — Ambulatory Visit: Payer: Medicare Other

## 2018-12-24 DIAGNOSIS — R262 Difficulty in walking, not elsewhere classified: Secondary | ICD-10-CM

## 2018-12-24 DIAGNOSIS — M545 Low back pain: Secondary | ICD-10-CM

## 2018-12-24 DIAGNOSIS — G8929 Other chronic pain: Secondary | ICD-10-CM

## 2018-12-24 DIAGNOSIS — Z9181 History of falling: Secondary | ICD-10-CM

## 2018-12-24 DIAGNOSIS — M6281 Muscle weakness (generalized): Secondary | ICD-10-CM

## 2018-12-24 DIAGNOSIS — R2681 Unsteadiness on feet: Secondary | ICD-10-CM | POA: Diagnosis not present

## 2018-12-24 NOTE — Therapy (Signed)
[facility] [address] [place], [address] Phone: [phone]   Fax:  [phone]  Physical Therapy Treatment  Patient Details  Name: [name] MRN: [id number] Date of Birth: [birth year] Referring Provider (PT): Jennings Books, MD   Encounter Date: 12/24/2018  PT End of Session - 12/24/18 1354    Visit Number  22    Number of Visits  45    Date for PT Re-Evaluation  01/24/19    Authorization Type  3    Authorization Time Period  of 10 progress report    PT Start Time  1354   pt arrived late   PT Stop Time  1443    PT Time Calculation (min)  49 min    Equipment Utilized During Treatment  Gait belt    Activity Tolerance  Patient tolerated treatment well    Behavior During Therapy  WFL for tasks assessed/performed       Past Medical History:  Diagnosis Date  . Anemia   . GERD (gastroesophageal reflux disease)   . Hypertension   . Peripheral vascular disease (HCC)    possible neuropathies in lower extremeties  . PONV (postoperative nausea and vomiting)    happens sometimes but better with pre med of zofran  . Presence of permanent cardiac pacemaker   . Syncope     Past Surgical History:  Procedure Laterality Date  . ABDOMINAL HYSTERECTOMY  1969  . APPENDECTOMY  1969   with hysterectomy  . BACK SURGERY  2001   rods in back. surgery on back x 3  . COLONOSCOPY    . I&D EXTREMITY Left 03/27/2018   Procedure: IRRIGATION AND DEBRIDEMENT LEFT ELBOW / OLECRANON BURSA;  Surgeon: Corky Mull, MD;  Location: [facility];  Service: Orthopedics;  Laterality: Left;  . I&D EXTREMITY Left 06/05/2018   Procedure: IRRIGATION AND DEBRIDEMENT EXTREMITY;  Surgeon: Corky Mull, MD;  Location: [facility];  Service: Orthopedics;  Laterality: Left;  . INSERT / REPLACE / REMOVE PACEMAKER  2004  . JOINT REPLACEMENT Left 2004   partial knee replacement  . OLECRANON BURSECTOMY Left 03/27/2018   Procedure: LEFT OLECRANON  BURSA;  Surgeon: Corky Mull, MD;  Location: [facility];  Service: Orthopedics;  Laterality: Left;  . PACEMAKER INSERTION  2014   dual chamber for complete heart block    There were no vitals filed for this visit.  Subjective Assessment - 12/24/18 1356    Subjective  Had PCP 3 month check up appointment. Getting set up to get and injection for her R shoulder. Her R shoulder bothers her for a while when she pushes up with her hands to stand.  Strength and walking is so so.     Pertinent History  Imparied gait. Pt states that she has had balance issues for too long. Feels more comfortable walking with her rw. Had tests which showed that she has neuropathy. However the doctor at [facility] said that her balance is coming from her back. Just wants to be strong so she can walk.  Started walking with her rw off and on for the past several months.  Also feels like she might need a new sleep number bed. Also had 2 wound surgeries at [name]. Pt fell in February 2019. Had to stop PT due to the L elbow surgery April 06, 2018 to remove a bursa. The wound still has not healed.   Currently sees a wound  doctor.   Feels numbness L foot from the ankle down.  No R foot numbness.  Sometimes unsure of where her L LE is in space.  Pt also states that she tires too quickly.  Has not been able to walk her dog in 2 years. Has not really been doing exercises at home except the sit <> stand. Pt states that prolonged sitting without back support will increase back pain     Limitations  Standing;Walking    How long can you sit comfortably?  1 hr    How long can you stand comfortably?  10 minutes    How long can you walk comfortably?  2 or 3 blocks    Diagnostic tests  EMG testing BLEs and BUEs which were negative    Patient Stated Goals  Improve LE strength. Wants to use the machines at the gym. Walk more steadily. Be able to take her dog (16 lbs) out on a leash.     Currently in Pain?  Yes    Pain Score  8     Pain  Location  Shoulder    Pain Orientation  Right    Pain Type  Chronic pain    Pain Onset  More than a month ago         [facility] PT Assessment - 12/24/18 1412      Observation/Other Assessments   Observations  R shoulder: pain with empty can, Neer's movements, decreased discomfort at anterior shoulder with shoulder flexion with scapular retraction                           PT Education - 12/24/18 1430    Education provided  Yes    Education Details  ther-ex    [employer]) Educated  Patient    Methods  Explanation;Demonstration;Tactile cues;Verbal cues    Comprehension  Returned demonstration;Verbalized understanding        Objectives  Pt ambulating with rw  TTP bilateral greater trochanter L >R   L elbow wound  No latex band allergies   [employer] Code: [email or web address]   Ther-ex  CGAto SBAwith standing activities.  R shoulder: Pain with empty can, Neer's movements. Decreased discomfort at anterior shoulder with shoulder flexion with scapular retraction  4/5 ER, 4+/5 IR stretngth  Worked on shoulder strengthening to promote ability to perform sit <> stand more comfortably for her R shoulder.   Standing unsupported:   R shoulder extension with scapular retraction yellow band 10x3. Max cues for scapular retraction   Seated trunk extension isometrics in neutral 10x5 seconds for 3 sets   To help decrease R low back pain. No R low back pain afterwards  Seated trunk flexion isometrics in neutral 10x5 seconds for 3 sets   Gait 60 ft with R hand held assist 60 ft. R low back catch.  Decreased catch sensation after sitting rest  Then 30 ft with R hand held assist. No R low back catch pain.   LAQ 3lbs 10x3each LE            Seated R hip extension isometrics 10x5 seconds for 2 sets to promote R glute max muscle use. R low back ache.   Seated trunk flexion with physioball 5 seconds x 10  Decreased R low back  discomfort   Improved exercise technique, movement at target joints, use of target muscles after min to mod to max verbal, visual, tactile cues.    Decreased R low back  discomfort with treatment to help decrease low back extension. Pt demonstrates signs and symptoms of impingement in R shoulder. Worked on scapular strengthening to help address and improve ability to perform sit <> stand with less discomfort for R shoulder. Pt states R shoulder felt better after session. Continued working on LE strengthening as well with promote ability to perform standing tasks with less difficulty. Pt will benefit from continued skilled physical therapy services to improve strength, function, and ability to ambulate.     PT Short Term Goals - 12/13/18 1651      PT SHORT TERM GOAL #1   Title  Patient will be independent with her HEP to improve strength and balance.     Time  3    Period  Weeks    Status  On-going    Target Date  01/03/19        PT Long Term Goals - 12/13/18 1652      PT LONG TERM GOAL #1   Title  Patient will improve her 10 MWT to 0.8 m/s or more with rw to promote better community ambulation.     Baseline  0.5 seconds with rw (09/06/2018); 0.63 seconds average with rw (10/18/2018); 0.69 m/s average using rw (12/10/2018)    Time  6    Period  Weeks    Status  On-going    Target Date  01/24/19      PT LONG TERM GOAL #2   Title  Patient will improve her DGI score using rw or least restrictive AD to 19/24 or more to promote balance.     Baseline  13/24 using rw (09/06/2018); 14/24 using rw (10/29/2018); 16/24 (12/10/2018)    Time  6    Period  Weeks    Status  On-going    Target Date  01/24/19      PT LONG TERM GOAL #3   Title  Patient will improve bilateral LE strength by at least 1/2 MMT to promote ability to support herself when performing standing tasks and improve balance.     Time  6    Period  Weeks    Status  Partially Met    Target Date  01/24/19      PT LONG TERM  GOAL #4   Title  Patient will have a decrease in back pain/ache to 5/10 or less to promote ability to perform standing tasks with less difficulty.    Baseline  8/10 at most for the past 3 months (09/06/2018); 5-6/10 at worst for the past 7 days (10/18/2018); 5/10 at worst for the past 7 days (12/10/2018)    Time  6    Period  Weeks    Status  Achieved    Target Date  01/24/19            Plan - 12/24/18 1354    Clinical Impression Statement  Decreased R low back discomfort with treatment to help decrease low back extension. Pt demonstrates signs and symptoms of impingement in R shoulder. Worked on scapular strengthening to help address and improve ability to perform sit <> stand with less discomfort for R shoulder. Pt states R shoulder felt better after session. Continued working on LE strengthening as well with promote ability to perform standing tasks with less difficulty. Pt will benefit from continued skilled physical therapy services to improve strength, function, and ability to ambulate.     Rehab Potential  Fair    Clinical Impairments Affecting Rehab Potential  (-) chronicity of  condition, age, comorbidities; (+) motivated    PT Frequency  2x / week    PT Duration  6 weeks    PT Treatment/Interventions  Aquatic Therapy;Gait training;Therapeutic activities;Therapeutic exercise;Balance training;Neuromuscular re-education;Patient/family education;Manual techniques;Dry needling    PT Next Visit Plan  trunk, hip, scapular strengthening, balance, gait, manual techniques PRN    Consulted and Agree with Plan of Care  Patient       Patient will benefit from skilled therapeutic intervention in order to improve the following deficits and impairments:  Abnormal gait, Decreased activity tolerance, Decreased balance, Decreased endurance, Decreased range of motion, Decreased strength, Difficulty walking, Improper body mechanics, Postural dysfunction, Pain, Decreased knowledge of use of DME  Visit  Diagnosis: Unsteadiness on feet  Difficulty in walking, not elsewhere classified  Muscle weakness (generalized)  Chronic low back pain, unspecified back pain laterality, unspecified whether sciatica present  History of falling     Problem List Patient Active Problem List   Diagnosis Date Noted  . Septic olecranon bursitis of left elbow 03/27/2018  . Lymphedema 10/11/2017  . Chronic venous insufficiency 10/11/2017  . Leg pain 10/11/2017  . Leg swelling 10/11/2017  . COPD (chronic obstructive pulmonary disease) ([place]) 10/11/2017  . Essential hypertension 10/11/2017    Joneen Boers PT, DPT    12/24/2018, 3:11 PM  [facility] [address], [place], [address] Phone: [phone]   Fax:  [phone]  Name: [name] MRN: [id number] Date of Birth: 20-Oct-1930

## 2018-12-26 ENCOUNTER — Other Ambulatory Visit
Admission: RE | Admit: 2018-12-26 | Discharge: 2018-12-26 | Disposition: A | Discharge status: Home / Self Care | Payer: Medicare Other | Source: Ambulatory Visit | Attending: Internal Medicine | Admitting: Internal Medicine

## 2018-12-26 ENCOUNTER — Encounter: Payer: Medicare Other | Admitting: Internal Medicine

## 2018-12-26 ENCOUNTER — Ambulatory Visit: Payer: Medicare Other

## 2018-12-26 DIAGNOSIS — L98495 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: Secondary | ICD-10-CM | POA: Diagnosis not present

## 2018-12-26 DIAGNOSIS — S41102S Unspecified open wound of left upper arm, sequela: Secondary | ICD-10-CM | POA: Insufficient documentation

## 2018-12-26 DIAGNOSIS — X58XXXS Exposure to other specified factors, sequela: Secondary | ICD-10-CM | POA: Insufficient documentation
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
## 2018-12-28 NOTE — Progress Notes (Signed)
[name] ([id number]) Visit Report for 12/26/2018 Arrival Information Details Patient Name: KIMBERLYE, [name] Date of Service: 12/26/2018 1:45 PM Medical Record Number: [id number] Patient Account Number: [id number] Date of Birth/Sex: 1930-04-17 (85 y.o. F) Treating RN: Huel Coventry Primary Care Jaquarious Grey: Aram Beecham Other Clinician: Referring Peachie Barkalow: Aram Beecham Treating Cole Klugh/Extender: Altamese [place] in Treatment: 78 Visit Information History Since Last Visit Added or deleted any medications: No Patient Arrived: Walker Any new allergies or adverse reactions: No Arrival Time: 13:56 Had a fall or experienced change in No Accompanied By: self activities of daily living that may affect Transfer Assistance: None risk of falls: Patient Identification Verified: Yes Signs or symptoms of abuse/neglect since last visito No Secondary Verification Process Yes Hospitalized since last visit: No Completed: Implantable device outside of the clinic excluding No Patient Requires Transmission-Based No cellular tissue based products placed in the center Precautions: since last visit: Patient Has Alerts: Yes Has Dressing in Place as Prescribed: Yes Patient Alerts: non compressible left Pain Present Now: No leg Electronic Signature(s) Signed: 12/27/2018 4:16:21 PM By: Dayton Martes RCP, RRT, CHT Entered By: Dayton Martes on 12/26/2018 13:57:22 [name] ([id number]) -------------------------------------------------------------------------------- Encounter Discharge Information Details Patient Name: [name] Date of Service: 12/26/2018 1:45 PM Medical Record Number: [id number] Patient Account Number: [id number] Date of Birth/Sex: 1930-10-07 (85 y.o. F) Treating RN: Curtis Sites Primary Care Everline Mahaffy: Aram Beecham Other Clinician: Referring Regla Fitzgibbon: Aram Beecham Treating Cristhian Vanhook/Extender: Altamese [place] in  Treatment: 2 Encounter Discharge Information Items Post Procedure Vitals Discharge Condition: Stable Temperature (F): 97.9 Ambulatory Status: Walker Pulse (bpm): 68 Discharge Destination: Home Respiratory Rate (breaths/min): 16 Transportation: Private Auto Blood Pressure (mmHg): 137/69 Accompanied By: self Schedule Follow-up Appointment: Yes Clinical Summary of Care: Electronic Signature(s) Signed: 12/26/2018 4:53:09 PM By: Curtis Sites Entered By: Curtis Sites on 12/26/2018 14:49:02 [name] ([id number]) -------------------------------------------------------------------------------- Lower Extremity Assessment Details Patient Name: [name] Date of Service: 12/26/2018 1:45 PM Medical Record Number: [id number] Patient Account Number: [id number] Date of Birth/Sex: [birth year] (85 y.o. F) Treating RN: Rema [name] Primary Care Erryn Dickison: Aram Beecham Other Clinician: Referring Dian Laprade: Aram Beecham Treating Makarios Madlock/Extender: Altamese [place] in Treatment: 20 Electronic Signature(s) Signed: 12/26/2018 3:32:14 PM By: Rema [name] Entered By: Rema [name] on 12/26/2018 14:12:07 [name] ([id number]) -------------------------------------------------------------------------------- Multi Wound Chart Details Patient Name: [name] Date of Service: 12/26/2018 1:45 PM Medical Record Number: [id number] Patient Account Number: [id number] Date of Birth/Sex: Apr 19, 1930 (85 y.o. F) Treating RN: Huel Coventry Primary Care Wyndham Santilli: Aram Beecham Other Clinician: Referring Anjalina Bergevin: Aram Beecham Treating Mahealani Sulak/Extender: Altamese [place] in Treatment: 22 Vital Signs Height(in): 60 Pulse(bpm): 68 Weight(lbs): 123 Blood Pressure(mmHg): 137/69 Body Mass Index(BMI): 24 Temperature(F): 97.9 Respiratory Rate 18 (breaths/min): Photos: [N/A:N/A] Wound Location: Left Elbow N/A N/A Wounding Event: Surgical Injury N/A N/A Primary Etiology: Open  Surgical Wound N/A N/A Comorbid History: Cataracts, Glaucoma, Optic N/A N/A Neuritis, Chronic sinus problems/congestion, Middle ear problems Date Acquired: 01/26/2018 N/A N/A Weeks of Treatment: 34 N/A N/A Wound Status: Open N/A N/A Measurements L x W x D 0.9x0.7x0.2 N/A N/A (cm) Area (cm) : 0.495 N/A N/A Volume (cm) : 0.099 N/A N/A % Reduction in Area: -597.20% N/A N/A % Reduction in Volume: -607.10% N/A N/A Starting Position 1 1 (o'clock): Ending Position 1 12 (o'clock): Maximum Distance 1 (cm): 0.6 Undermining: Yes N/A N/A Classification: Full Thickness With Exposed N/A N/A Support Structures Exudate Amount: Medium N/A N/A Exudate Type: Purulent N/A N/A  Exudate Color: yellow, brown, green N/A N/A Wound Margin: Distinct, outline attached N/A N/A Granulation Amount: None Present (0%) N/A N/A [name], [name] ([id number]) Necrotic Amount: Medium (34-66%) N/A N/A Exposed Structures: Fat Layer (Subcutaneous N/A N/A Tissue) Exposed: Yes Fascia: No Tendon: No Muscle: No Joint: No Bone: No Epithelialization: None N/A N/A Debridement: Debridement - Excisional N/A N/A Pre-procedure 14:21 N/A N/A Verification/Time Out Taken: Pain Control: Lidocaine N/A N/A Tissue Debrided: Subcutaneous, Slough N/A N/A Level: Skin/Subcutaneous Tissue N/A N/A Debridement Area (sq cm): 0.63 N/A N/A Instrument: Curette N/A N/A Bleeding: Minimum N/A N/A Hemostasis Achieved: Pressure N/A N/A Debridement Treatment Procedure was tolerated well N/A N/A Response: Post Debridement 0.9x0.7x0.3 N/A N/A Measurements L x W x D (cm) Post Debridement Volume: 0.148 N/A N/A (cm) Periwound Skin Texture: Excoriation: No N/A N/A Induration: No Callus: No Crepitus: No Rash: No Scarring: No Periwound Skin Moisture: Maceration: Yes N/A N/A Dry/Scaly: No Periwound Skin Color: Erythema: Yes N/A N/A Atrophie Blanche: No Cyanosis: No Ecchymosis: No Hemosiderin Staining: No Mottled: No Pallor:  No Rubor: No Erythema Location: Circumferential N/A N/A Temperature: No Abnormality N/A N/A Tenderness on Palpation: Yes N/A N/A Wound Preparation: Ulcer Cleansing: N/A N/A Rinsed/Irrigated with Saline Topical Anesthetic Applied: Other: lidocaine 4% Procedures Performed: Debridement N/A N/A Treatment Notes Electronic Signature(s) Signed: 12/26/2018 4:38:41 PM By: Baltazar Najjarobson, Michael MD [name], [name] ([id number]) Entered By: Baltazar Najjarobson, Michael on 12/26/2018 14:39:56 [name], [name] ([id number]) -------------------------------------------------------------------------------- Multi-Disciplinary Care Plan Details Patient Name: [name], [name] Date of Service: 12/26/2018 1:45 PM Medical Record Number: [id number] Patient Account Number: [id number] Date of Birth/Sex: 03/19/1930 (85 y.o. F) Treating RN: Huel CoventryWoody, Kim Primary Care Mang Hazelrigg: Aram Beecham[place], Jeffrey Other Clinician: Referring Kyrese Gartman: Aram Beecham[place], Jeffrey Treating Kailyn Dubie/Extender: Altamese CarolinaOBSON, [name] Weeks in Treatment: 7558 Active Inactive Soft Tissue Infection Nursing Diagnoses: Impaired tissue integrity Goals: Patient's soft tissue infection will resolve Date Initiated: 07/18/2018 Target Resolution Date: 07/27/2018 Goal Status: Active Interventions: Assess signs and symptoms of infection every visit Treatment Activities: Culture and sensitivity : 07/18/2018 Systemic antibiotics : 07/18/2018 Notes: Wound/Skin Impairment Nursing Diagnoses: Impaired tissue integrity Knowledge deficit related to ulceration/compromised skin integrity Goals: Patient/caregiver will verbalize understanding of skin care regimen Date Initiated: 11/14/2017 Target Resolution Date: 11/28/2017 Goal Status: Active Ulcer/skin breakdown will have a volume reduction of 30% by week 4 Date Initiated: 11/14/2017 Target Resolution Date: 11/28/2017 Goal Status: Active Interventions: Assess patient/caregiver ability to obtain necessary supplies Assess patient/caregiver  ability to perform ulcer/skin care regimen upon admission and as needed Assess ulceration(s) every visit Treatment Activities: Skin care regimen initiated : 11/14/2017 [name], [name] ([id number]) Notes: Electronic Signature(s) Signed: 12/26/2018 5:16:46 PM By: Elliot GurneyWoody, BSN, RN, CWS, Kim RN, BSN Entered By: Elliot GurneyWoody, BSN, RN, CWS, Kim on 12/26/2018 14:26:52 [name], Burkley V. ([id number]) -------------------------------------------------------------------------------- Pain Assessment Details Patient Name: [name], Alfie V. Date of Service: 12/26/2018 1:45 PM Medical Record Number: [id number] Patient Account Number: [id number] Date of Birth/Sex: 03/26/1930 (85 y.o. F) Treating RN: Huel CoventryWoody, Kim Primary Care Marlin Brys: Aram BeechamS[place]ks, Jeffrey Other Clinician: Referring Ellesse Antenucci: Aram BeechamS[place], Jeffrey Treating Julio Zappia/Extender: Altamese CarolinaOBSON, [name] Weeks in Treatment: 6958 Active Problems Location of Pain Severity and Description of Pain Patient Has Paino No Site Locations Pain Management and Medication Current Pain Management: Electronic Signature(s) Signed: 12/26/2018 5:16:46 PM By: Elliot GurneyWoody, BSN, RN, CWS, Kim RN, BSN Signed: 12/27/2018 4:16:21 PM By: Dayton MartesWallace, RCP,RRT,CHT, Sallie RCP, RRT, CHT Entered By: Dayton MartesWallace, RCP,RRT,CHT, Sallie on 12/26/2018 13:57:29 [name], [name] ([id number]) -------------------------------------------------------------------------------- Patient/Caregiver Education Details Patient Name: [name], Monserratt V. Date of Service: 12/26/2018 1:45 PM Medical  Record Number: [id number] Patient Account Number: [id number] Date of Birth/Gender: 16-Mar-1930 (85 y.o. F) Treating RN: Huel Coventry Primary Care Physician: Aram Beecham Other Clinician: Referring Physician: Aram Beecham Treating Physician/Extender: Altamese [place] in Treatment: 3 Education Assessment Education Provided To: Patient Education Topics Provided Wound/Skin Impairment: Handouts: Caring for Your Ulcer Methods:  Demonstration, Explain/Verbal Responses: State content correctly Electronic Signature(s) Signed: 12/26/2018 5:16:46 PM By: Elliot Gurney, BSN, RN, CWS, Kim RN, BSN Entered By: Elliot Gurney, BSN, RN, CWS, Kim on 12/26/2018 14:29:27 [name] ([id number]) -------------------------------------------------------------------------------- Wound Assessment Details Patient Name: [name] Date of Service: 12/26/2018 1:45 PM Medical Record Number: [id number] Patient Account Number: [id number] Date of Birth/Sex: 26-Oct-1930 (85 y.o. F) Treating RN: Rema [name] Primary Care Annalynne Ibanez: Aram Beecham Other Clinician: Referring [place] Perra: Aram Beecham Treating Najmo Pardue/Extender: Altamese [place] in Treatment: 58 Wound Status Wound Number: 9 Primary Open Surgical Wound Etiology: Wound Location: Left Elbow Wound Open Wounding Event: Surgical Injury Status: Date Acquired: 01/26/2018 Comorbid Cataracts, Glaucoma, Optic Neuritis, Chronic Weeks Of Treatment: 34 History: sinus problems/congestion, Middle ear problems Clustered Wound: No Photos Photo Uploaded By: Rema [name] on 12/26/2018 14:18:04 Wound Measurements Length: (cm) 0.9 Width: (cm) 0.7 Depth: (cm) 0.2 Area: (cm) 0.495 Volume: (cm) 0.099 % Reduction in Area: -597.2% % Reduction in Volume: -607.1% Epithelialization: None Tunneling: No Undermining: Yes Starting Position (o'clock): 1 Ending Position (o'clock): 12 Maximum Distance: (cm) 0.6 Wound Description Full Thickness With Exposed Support Foul Odo Classification: Structures Slough/F Wound Margin: Distinct, outline attached Exudate Medium Amount: Exudate Type: Purulent Exudate Color: yellow, brown, green r After Cleansing: No ibrino No Wound Bed Granulation Amount: None Present (0%) Exposed Structure Necrotic Amount: Medium (34-66%) Fascia Exposed: No Necrotic Quality: Adherent Slough Fat Layer (Subcutaneous Tissue) Exposed: Yes AARADHYA, MCLAIN  ([id number]) Tendon Exposed: No Muscle Exposed: No Joint Exposed: No Bone Exposed: No Periwound Skin Texture Texture Color No Abnormalities Noted: No No Abnormalities Noted: No Callus: No Atrophie Blanche: No Crepitus: No Cyanosis: No Excoriation: No Ecchymosis: No Induration: No Erythema: Yes Rash: No Erythema Location: Circumferential Scarring: No Hemosiderin Staining: No Mottled: No Moisture Pallor: No No Abnormalities Noted: No Rubor: No Dry / Scaly: No Maceration: Yes Temperature / Pain Temperature: No Abnormality Tenderness on Palpation: Yes Wound Preparation Ulcer Cleansing: Rinsed/Irrigated with Saline Topical Anesthetic Applied: Other: lidocaine 4%, Treatment Notes Wound #9 (Left Elbow) Notes endoform, border foam dressing Electronic Signature(s) Signed: 12/26/2018 3:32:14 PM By: Rema [name] Entered By: Rema [name] on 12/26/2018 14:11:57 [name] ([id number]) -------------------------------------------------------------------------------- Vitals Details Patient Name: [name] Date of Service: 12/26/2018 1:45 PM Medical Record Number: [id number] Patient Account Number: [id number] Date of Birth/Sex: 05-15-1930 (85 y.o. F) Treating RN: Huel Coventry Primary Care Destanie Tibbetts: Aram Beecham Other Clinician: Referring Wilder Kurowski: Aram Beecham Treating Emree Locicero/Extender: Altamese [place] in Treatment: 44 Vital Signs Time Taken: 13:57 Temperature (F): 97.9 Height (in): 60 Pulse (bpm): 68 Weight (lbs): 123 Respiratory Rate (breaths/min): 18 Body Mass Index (BMI): 24 Blood Pressure (mmHg): 137/69 Reference Range: 80 - 120 mg / dl Electronic Signature(s) Signed: 12/27/2018 4:16:21 PM By: Dayton Martes RCP, RRT, CHT Entered By: Dayton Martes on 12/26/2018 14:00:58

## 2018-12-29 LAB — AEROBIC CULTURE W GRAM STAIN (SUPERFICIAL SPECIMEN)
Culture: NORMAL
Gram Stain: NONE SEEN

## 2018-12-29 LAB — AEROBIC CULTURE  (SUPERFICIAL SPECIMEN)

## 2019-01-01 ENCOUNTER — Ambulatory Visit: Payer: Medicare Other

## 2019-01-01 DIAGNOSIS — G8929 Other chronic pain: Secondary | ICD-10-CM

## 2019-01-01 DIAGNOSIS — Z9181 History of falling: Secondary | ICD-10-CM

## 2019-01-01 DIAGNOSIS — M545 Low back pain, unspecified: Secondary | ICD-10-CM

## 2019-01-01 DIAGNOSIS — R2681 Unsteadiness on feet: Secondary | ICD-10-CM | POA: Diagnosis not present

## 2019-01-01 DIAGNOSIS — R262 Difficulty in walking, not elsewhere classified: Secondary | ICD-10-CM

## 2019-01-01 DIAGNOSIS — M6281 Muscle weakness (generalized): Secondary | ICD-10-CM

## 2019-01-01 NOTE — Therapy (Signed)
[facility] [address], [place], [address] Phone: [phone]   Fax:  [phone]  Physical Therapy Treatment  Patient Details  Name: [name] MRN: [id number] Date of Birth: [birth year] Referring Provider (PT): Jennings Books, MD   Encounter Date: 01/01/2019  PT End of Session - 01/01/19 1436    Visit Number  23    Number of Visits  45    Date for PT Re-Evaluation  01/24/19    Authorization Type  4    Authorization Time Period  of 10 progress report    PT Start Time  1437    PT Stop Time  1520    PT Time Calculation (min)  43 min    Equipment Utilized During Treatment  Gait belt    Activity Tolerance  Patient tolerated treatment well    Behavior During Therapy  WFL for tasks assessed/performed       Past Medical History:  Diagnosis Date  . Anemia   . GERD (gastroesophageal reflux disease)   . Hypertension   . Peripheral vascular disease (HCC)    possible neuropathies in lower extremeties  . PONV (postoperative nausea and vomiting)    happens sometimes but better with pre med of zofran  . Presence of permanent cardiac pacemaker   . Syncope     Past Surgical History:  Procedure Laterality Date  . ABDOMINAL HYSTERECTOMY  1969  . APPENDECTOMY  1969   with hysterectomy  . BACK SURGERY  2001   rods in back. surgery on back x 3  . COLONOSCOPY    . I&D EXTREMITY Left 03/27/2018   Procedure: IRRIGATION AND DEBRIDEMENT LEFT ELBOW / OLECRANON BURSA;  Surgeon: Corky Mull, MD;  Location: [facility] ORS;  Service: Orthopedics;  Laterality: Left;  . I&D EXTREMITY Left 06/05/2018   Procedure: IRRIGATION AND DEBRIDEMENT EXTREMITY;  Surgeon: Corky Mull, MD;  Location: [facility] OR[facility];  Service: Orthopedics;  Laterality: Left;  . INSERT / REPLACE / REMOVE PACEMAKER  2004  . JOINT REPLACEMENT Left 2004   partial knee replacement  . OLECRANON BURSECTOMY Left 03/27/2018   Procedure: LEFT OLECRANON BURSA;  Surgeon:  Corky Mull, MD;  Location: [facility] OR[facility];  Service: Orthopedics;  Laterality: Left;  . PACEMAKER INSERTION  2014   dual chamber for complete heart block    There were no vitals filed for this visit.  Subjective Assessment - 01/01/19 1438    Subjective  Doing ok. Not super good (pertaining to motivation and spiritual). Pt was also told to stop the methotrexate for about 4 weeks by her rheumatologist to see if there is any improvement with the elbow.     Pertinent History  Imparied gait. Pt states that she has had balance issues for too long. Feels more comfortable walking with her rw. Had tests which showed that she has neuropathy. However the doctor at [facility] said that her balance is coming from her back. Just wants to be strong so she can walk.  Started walking with her rw off and on for the past several months.  Also feels like she might need a new sleep number bed. Also had 2 wound surgeries at [name]. Pt fell in February 2019. Had to stop PT due to the L elbow surgery April 06, 2018 to remove a bursa. The wound still has not healed.   Currently sees a wound doctor.   Feels numbness L foot from the ankle down.  No R foot numbness.  Sometimes unsure of where her L LE is in space.  Pt also states that she tires too quickly.  Has not been able to walk her dog in 2 years. Has not really been doing exercises at home except the sit <> stand. Pt states that prolonged sitting without back support will increase back pain     Limitations  Standing;Walking    How long can you sit comfortably?  1 hr    How long can you stand comfortably?  10 minutes    How long can you walk comfortably?  2 or 3 blocks    Diagnostic tests  EMG testing BLEs and BUEs which were negative    Patient Stated Goals  Improve LE strength. Wants to use the machines at the gym. Walk more steadily. Be able to take her dog (16 lbs) out on a leash.     Currently in Pain?  No/denies    Pain Score  0-No pain   pt sitting still    Pain Onset  More than a month ago                               PT Education - 01/01/19 1451    Education provided  Yes    Education Details  ther-ex    [employer]) Educated  Patient    Methods  Explanation;Demonstration;Tactile cues;Verbal cues    Comprehension  Returned demonstration;Verbalized understanding        Objectives  Pt ambulating with rw  TTP bilateral greater trochanter L >R   L elbow wound  No latex band allergies    MedbridgeAccess Code: [email or web address]   Ther-ex  CGAto SBAwith standing activities.   LAQ 3lbs 10x3each LE  Standing hip abduction with B UE assist. 2 lbs each LE  10x3 each LE  Seated hip adductor ball and glute max squeeze 10x5 seconds for 2 sets   Seated trunk flexion isometrics in neutral 10x5 seconds for 3 sets   R low back discomfort  Seated trunk extension isometrics in neutral 10x5 seconds for 3 sets   Decreased R low back discomfort     Improved exercise technique, movement at target joints, use of target muscles after min to mod verbal, visual, tactile cues.      Gait training  Gait with SPC on R UE, CGA   100 ft, then 200 ft. No LOB  Gait with one UE assist   Forward, backward 5 ft each way 10x  Improved confidence with backwards walk with one UE assist observed  Stepping over 4 mini hurdles forward with one UE assist  12x   Improved technique, movement at target joints, use of target muscles after mod verbal, visual, tactile cues.    Continued working on trunk and LE strengthening to promote ability to perform standing tasks more comfortably for her back as well as with decreased fall risk. Continued working on gait using [place] or one UE assist to promote ability ambulate with least restrictive device. Able to ambulate 200 ft with SPC and without LOB. Pt also demonstrates improved confidence with backwards walking with one UE assist observed. Pt will benefit from  continued skilled physical therapy services to improve strength, balance, and ability to ambulate.       PT Short Term Goals - 12/13/18 1651      PT SHORT TERM GOAL #1   Title  Patient will be independent with  her HEP to improve strength and balance.     Time  3    Period  Weeks    Status  On-going    Target Date  01/03/19        PT Long Term Goals - 12/13/18 1652      PT LONG TERM GOAL #1   Title  Patient will improve her 10 MWT to 0.8 m/s or more with rw to promote better community ambulation.     Baseline  0.5 seconds with rw (09/06/2018); 0.63 seconds average with rw (10/18/2018); 0.69 m/s average using rw (12/10/2018)    Time  6    Period  Weeks    Status  On-going    Target Date  01/24/19      PT LONG TERM GOAL #2   Title  Patient will improve her DGI score using rw or least restrictive AD to 19/24 or more to promote balance.     Baseline  13/24 using rw (09/06/2018); 14/24 using rw (10/29/2018); 16/24 (12/10/2018)    Time  6    Period  Weeks    Status  On-going    Target Date  01/24/19      PT LONG TERM GOAL #3   Title  Patient will improve bilateral LE strength by at least 1/2 MMT to promote ability to support herself when performing standing tasks and improve balance.     Time  6    Period  Weeks    Status  Partially Met    Target Date  01/24/19      PT LONG TERM GOAL #4   Title  Patient will have a decrease in back pain/ache to 5/10 or less to promote ability to perform standing tasks with less difficulty.    Baseline  8/10 at most for the past 3 months (09/06/2018); 5-6/10 at worst for the past 7 days (10/18/2018); 5/10 at worst for the past 7 days (12/10/2018)    Time  6    Period  Weeks    Status  Achieved    Target Date  01/24/19            Plan - 01/01/19 1436    Clinical Impression Statement  Continued working on trunk and LE strengthening to promote ability to perform standing tasks more comfortably for her back as well as with decreased fall  risk. Continued working on gait using [place] or one UE assist to promote ability ambulate with least restrictive device. Able to ambulate 200 ft with SPC and without LOB. Pt also demonstrates improved confidence with backwards walking with one UE assist observed. Pt will benefit from continued skilled physical therapy services to improve strength, balance, and ability to ambulate.     Rehab Potential  Fair    Clinical Impairments Affecting Rehab Potential  (-) chronicity of condition, age, comorbidities; (+) motivated    PT Frequency  2x / week    PT Duration  6 weeks    PT Treatment/Interventions  Aquatic Therapy;Gait training;Therapeutic activities;Therapeutic exercise;Balance training;Neuromuscular re-education;Patient/family education;Manual techniques;Dry needling    PT Next Visit Plan  trunk, hip, scapular strengthening, balance, gait, manual techniques PRN    Consulted and Agree with Plan of Care  Patient       Patient will benefit from skilled therapeutic intervention in order to improve the following deficits and impairments:  Abnormal gait, Decreased activity tolerance, Decreased balance, Decreased endurance, Decreased range of motion, Decreased strength, Difficulty walking, Improper body mechanics, Postural dysfunction, Pain, Decreased  knowledge of use of DME  Visit Diagnosis: Unsteadiness on feet  Difficulty in walking, not elsewhere classified  Muscle weakness (generalized)  Chronic low back pain, unspecified back pain laterality, unspecified whether sciatica present  History of falling     Problem List Patient Active Problem List   Diagnosis Date Noted  . Septic olecranon bursitis of left elbow 03/27/2018  . Lymphedema 10/11/2017  . Chronic venous insufficiency 10/11/2017  . Leg pain 10/11/2017  . Leg swelling 10/11/2017  . COPD (chronic obstructive pulmonary disease) ([place]) 10/11/2017  . Essential hypertension 10/11/2017    Joneen Boers PT, DPT   01/01/2019, 3:48  PM  [facility] [address] [place], [address] Phone: [phone]   Fax:  [phone]  Name: [name] MRN: [id number] Date of Birth: 1930/09/11

## 2019-01-02 ENCOUNTER — Encounter: Payer: Medicare Other | Admitting: Internal Medicine

## 2019-01-02 DIAGNOSIS — L98495 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: Secondary | ICD-10-CM | POA: Diagnosis not present

## 2019-01-04 NOTE — Progress Notes (Signed)
REALYNN, KONJA ([id number]) Visit Report for 01/02/2019 Arrival Information Details Patient Name: [name], [name]. Date of Service: 01/02/2019 11:00 AM Medical Record Number: [id number] Patient Account Number: [id number] Date of Birth/Sex: 1930-11-02 (85 y.o. F) Treating RN: Huel Coventry Primary Care Hendry Speas: Aram Beecham Other Clinician: Referring Annalisse Minkoff: Aram Beecham Treating Felis Quillin/Extender: Altamese [place] in Treatment: 61 Visit Information History Since Last Visit Added or deleted any medications: No Patient Arrived: Walker Any new allergies or adverse reactions: No Arrival Time: 11:18 Had a fall or experienced change in No Accompanied By: self activities of daily living that may affect Transfer Assistance: None risk of falls: Patient Identification Verified: Yes Signs or symptoms of abuse/neglect since last visito No Secondary Verification Process Yes Hospitalized since last visit: No Completed: Implantable device outside of the clinic excluding No Patient Requires Transmission-Based No cellular tissue based products placed in the center Precautions: since last visit: Patient Has Alerts: Yes Has Dressing in Place as Prescribed: Yes Patient Alerts: non compressible left Pain Present Now: No leg Electronic Signature(s) Signed: 01/02/2019 11:55:15 AM By: Dayton Martes RCP, RRT, CHT Entered By: Dayton Martes on 01/02/2019 11:19:05 [name] ([id number]) -------------------------------------------------------------------------------- Encounter Discharge Information Details Patient Name: [name] Date of Service: 01/02/2019 11:00 AM Medical Record Number: [id number] Patient Account Number: [id number] Date of Birth/Sex: [birth year] (85 y.o. F) Treating RN: Curtis Sites Primary Care Devarius Nelles: Aram Beecham Other Clinician: Referring Jazzma Neidhardt: Aram Beecham Treating Baden Betsch/Extender: Altamese [place] in  Treatment: 71 Encounter Discharge Information Items Post Procedure Vitals Discharge Condition: Stable Temperature (F): 98.1 Ambulatory Status: Walker Pulse (bpm): 65 Discharge Destination: Home Respiratory Rate (breaths/min): 18 Transportation: Private Auto Blood Pressure (mmHg): 113/69 Accompanied By: self Schedule Follow-up Appointment: Yes Clinical Summary of Care: Electronic Signature(s) Signed: 01/02/2019 12:02:20 PM By: Curtis Sites Entered By: Curtis Sites on 01/02/2019 12:02:20 [name] ([id number]) -------------------------------------------------------------------------------- Lower Extremity Assessment Details Patient Name: [name] Date of Service: 01/02/2019 11:00 AM Medical Record Number: [id number] Patient Account Number: [id number] Date of Birth/Sex: 1930/08/24 (85 y.o. F) Treating RN: Rema [name] Primary Care Vicenta Olds: Aram Beecham Other Clinician: Referring Roshanna Cimino: Aram Beecham Treating Agastya Meister/Extender: Altamese [place] in Treatment: 98 Electronic Signature(s) Signed: 01/02/2019 4:29:26 PM By: Rema [name] Entered By: Rema [name] on 01/02/2019 11:24:12 [name] ([id number]) -------------------------------------------------------------------------------- Multi Wound Chart Details Patient Name: [name] Date of Service: 01/02/2019 11:00 AM Medical Record Number: [id number] Patient Account Number: [id number] Date of Birth/Sex: 1930-10-18 (85 y.o. F) Treating RN: Huel Coventry Primary Care Eve Rey: Aram Beecham Other Clinician: Referring Gracin Mcpartland: Aram Beecham Treating Karandeep Resende/Extender: Altamese [place] in Treatment: 36 Vital Signs Height(in): 60 Pulse(bpm): 65 Weight(lbs): 123 Blood Pressure(mmHg): 113/69 Body Mass Index(BMI): 24 Temperature(F): 98.1 Respiratory Rate 18 (breaths/min): Photos: [N/A:N/A] Wound Location: Left Elbow N/A N/A Wounding Event: Surgical Injury N/A N/A Primary Etiology:  Open Surgical Wound N/A N/A Comorbid History: Cataracts, Glaucoma, Optic N/A N/A Neuritis, Chronic sinus problems/congestion, Middle ear problems Date Acquired: 01/26/2018 N/A N/A Weeks of Treatment: 35 N/A N/A Wound Status: Open N/A N/A Measurements L x W x D 0.9x0.7x0.2 N/A N/A (cm) Area (cm) : 0.495 N/A N/A Volume (cm) : 0.099 N/A N/A % Reduction in Area: -597.20% N/A N/A % Reduction in Volume: -607.10% N/A N/A Starting Position 1 1 (o'clock): Ending Position 1 12 (o'clock): Maximum Distance 1 (cm): 0.3 Undermining: Yes N/A N/A Classification: Full Thickness With Exposed N/A N/A Support Structures Exudate Amount: Medium N/A N/A Exudate Type: Purulent N/A N/A  Exudate Color: yellow, brown, green N/A N/A Wound Margin: Distinct, outline attached N/A N/A Granulation Amount: None Present (0%) N/A N/A Natasha BassetHENRY, [name] ([id number]) Necrotic Amount: Medium (34-66%) N/A N/A Exposed Structures: Fat Layer (Subcutaneous N/A N/A Tissue) Exposed: Yes Fascia: No Tendon: No Muscle: No Joint: No Bone: No Epithelialization: None N/A N/A Debridement: Debridement - Excisional N/A N/A Pre-procedure 11:34 N/A N/A Verification/Time Out Taken: Pain Control: Lidocaine N/A N/A Tissue Debrided: Subcutaneous N/A N/A Level: Skin/Subcutaneous Tissue N/A N/A Debridement Area (sq cm): 0.63 N/A N/A Instrument: Curette N/A N/A Bleeding: Moderate N/A N/A Hemostasis Achieved: Pressure N/A N/A Debridement Treatment Procedure was tolerated well N/A N/A Response: Post Debridement 0.9x0.7x0.3 N/A N/A Measurements L x W x D (cm) Post Debridement Volume: 0.148 N/A N/A (cm) Periwound Skin Texture: Excoriation: No N/A N/A Induration: No Callus: No Crepitus: No Rash: No Scarring: No Periwound Skin Moisture: Maceration: Yes N/A N/A Dry/Scaly: No Periwound Skin Color: Erythema: Yes N/A N/A Atrophie Blanche: No Cyanosis: No Ecchymosis: No Hemosiderin Staining: No Mottled: No Pallor:  No Rubor: No Erythema Location: Circumferential N/A N/A Temperature: No Abnormality N/A N/A Tenderness on Palpation: Yes N/A N/A Wound Preparation: Ulcer Cleansing: N/A N/A Rinsed/Irrigated with Saline Topical Anesthetic Applied: Other: lidocaine 4% Procedures Performed: Cellular or Tissue Based N/A N/A Product Debridement Treatment Notes Wound #9 (Left Elbow) Natasha BassetHENRY, [name] ([id number]) Notes PuraPly, steri strips, mepitel, gauze bolster and border foam dressing Electronic Signature(s) Signed: 01/02/2019 5:53:07 PM By: Baltazar Najjarobson, Michael MD Entered By: Baltazar Najjarobson, Michael on 01/02/2019 12:19:01 Natasha BassetHENRY, [name] ([id number]) -------------------------------------------------------------------------------- Multi-Disciplinary Care Plan Details Patient Name: Natasha BassetHENRY, [name] Date of Service: 01/02/2019 11:00 AM Medical Record Number: [id number] Patient Account Number: [id number] Date of Birth/Sex: 05/02/1930 (85 y.o. F) Treating RN: Huel CoventryWoody, Kim Primary Care Tashunda Vandezande: Aram BeechamSparks, Jeffrey Other Clinician: Referring Jacion Dismore: Aram BeechamSparks, Jeffrey Treating Haitham Dolinsky/Extender: Altamese CarolinaOBSON, [name] Weeks in Treatment: 4659 Active Inactive Soft Tissue Infection Nursing Diagnoses: Impaired tissue integrity Goals: Patient's soft tissue infection will resolve Date Initiated: 07/18/2018 Target Resolution Date: 07/27/2018 Goal Status: Active Interventions: Assess signs and symptoms of infection every visit Treatment Activities: Culture and sensitivity : 07/18/2018 Systemic antibiotics : 07/18/2018 Notes: Wound/Skin Impairment Nursing Diagnoses: Impaired tissue integrity Knowledge deficit related to ulceration/compromised skin integrity Goals: Patient/caregiver will verbalize understanding of skin care regimen Date Initiated: 11/14/2017 Target Resolution Date: 11/28/2017 Goal Status: Active Ulcer/skin breakdown will have a volume reduction of 30% by week 4 Date Initiated: 11/14/2017 Target Resolution Date:  11/28/2017 Goal Status: Active Interventions: Assess patient/caregiver ability to obtain necessary supplies Assess patient/caregiver ability to perform ulcer/skin care regimen upon admission and as needed Assess ulceration(s) every visit Treatment Activities: Skin care regimen initiated : 11/14/2017 Natasha BassetHENRY, Temitope V. ([id number]) Notes: Electronic Signature(s) Signed: 01/03/2019 5:43:44 PM By: Elliot GurneyWoody, BSN, RN, CWS, Kim RN, BSN Entered By: Elliot GurneyWoody, BSN, RN, CWS, Kim on 01/02/2019 11:35:43 Natasha BassetHENRY, Roselani V. ([id number]) -------------------------------------------------------------------------------- Pain Assessment Details Patient Name: Natasha BassetHENRY, [name] Date of Service: 01/02/2019 11:00 AM Medical Record Number: [id number] Patient Account Number: [id number] Date of Birth/Sex: 11/19/1930 (85 y.o. F) Treating RN: Huel CoventryWoody, Kim Primary Care Ariza Evans: Aram BeechamSparks, Jeffrey Other Clinician: Referring Zehra Rucci: Aram BeechamSparks, Jeffrey Treating Tylee Yum/Extender: Altamese CarolinaOBSON, [name] Weeks in Treatment: 6959 Active Problems Location of Pain Severity and Description of Pain Patient Has Paino No Site Locations Pain Management and Medication Current Pain Management: Electronic Signature(s) Signed: 01/02/2019 11:55:15 AM By: Sallee ProvencalWallace, RCP,RRT,CHT, Sallie RCP, RRT, CHT Signed: 01/03/2019 5:43:44 PM By: Elliot GurneyWoody, BSN, RN, CWS, Kim RN, BSN Entered By: Dayton MartesWallace, RCP,RRT,CHT, Sallie on 01/02/2019 11:19:14 Sherilyn CooterHENRY,  Natasha KeasJANICE V. ([id number]) -------------------------------------------------------------------------------- Patient/Caregiver Education Details Patient Name: Natasha BassetHENRY, [name] Date of Service: 01/02/2019 11:00 AM Medical Record Number: [id number] Patient Account Number: [id number] Date of Birth/Gender: 02/09/1930 (85 y.o. F) Treating RN: Huel CoventryWoody, Kim Primary Care Physician: Aram BeechamSparks, Jeffrey Other Clinician: Referring Physician: Aram BeechamSparks, Jeffrey Treating Physician/Extender: Altamese CarolinaOBSON, [name] Weeks in Treatment: 1259 Education  Assessment Education Provided To: Patient Education Topics Provided Wound/Skin Impairment: Handouts: Caring for Your Ulcer, Other: leave dressing in place all week. Methods: Demonstration, Explain/Verbal Responses: State content correctly Electronic Signature(s) Signed: 01/03/2019 5:43:44 PM By: Elliot GurneyWoody, BSN, RN, CWS, Kim RN, BSN Entered By: Elliot GurneyWoody, BSN, RN, CWS, Kim on 01/02/2019 11:44:12 Natasha BassetHENRY, Gurneet V. ([id number]) -------------------------------------------------------------------------------- Wound Assessment Details Patient Name: Natasha BassetHENRY, [name] Date of Service: 01/02/2019 11:00 AM Medical Record Number: [id number] Patient Account Number: [id number] Date of Birth/Sex: 08/17/1930 (85 y.o. F) Treating RN: Rema JasmineNg, Wendi Primary Care Farmer Mccahill: Aram BeechamSparks, Jeffrey Other Clinician: Referring Stefon Ramthun: Aram BeechamSparks, Jeffrey Treating Giles Currie/Extender: Altamese CarolinaOBSON, [name] Weeks in Treatment: 4459 Wound Status Wound Number: 9 Primary Open Surgical Wound Etiology: Wound Location: Left Elbow Wound Open Wounding Event: Surgical Injury Status: Date Acquired: 01/26/2018 Comorbid Cataracts, Glaucoma, Optic Neuritis, Chronic Weeks Of Treatment: 35 History: sinus problems/congestion, Middle ear problems Clustered Wound: No Photos Photo Uploaded By: Rema JasmineNg, Wendi on 01/02/2019 11:33:44 Wound Measurements Length: (cm) 0.9 Width: (cm) 0.7 Depth: (cm) 0.2 Area: (cm) 0.495 Volume: (cm) 0.099 % Reduction in Area: -597.2% % Reduction in Volume: -607.1% Epithelialization: None Tunneling: No Undermining: Yes Starting Position (o'clock): 1 Ending Position (o'clock): 12 Maximum Distance: (cm) 0.3 Wound Description Full Thickness With Exposed Support Foul Odo Classification: Structures Slough/F Wound Margin: Distinct, outline attached Exudate Medium Amount: Exudate Type: Purulent Exudate Color: yellow, brown, green r After Cleansing: No ibrino No Wound Bed Granulation Amount: None Present (0%)  Exposed Structure Necrotic Amount: Medium (34-66%) Fascia Exposed: No Necrotic Quality: Adherent Slough Fat Layer (Subcutaneous Tissue) Exposed: Yes Natasha BassetHENRY, [name] ([id number]) Tendon Exposed: No Muscle Exposed: No Joint Exposed: No Bone Exposed: No Periwound Skin Texture Texture Color No Abnormalities Noted: No No Abnormalities Noted: No Callus: No Atrophie Blanche: No Crepitus: No Cyanosis: No Excoriation: No Ecchymosis: No Induration: No Erythema: Yes Rash: No Erythema Location: Circumferential Scarring: No Hemosiderin Staining: No Mottled: No Moisture Pallor: No No Abnormalities Noted: No Rubor: No Dry / Scaly: No Maceration: Yes Temperature / Pain Temperature: No Abnormality Tenderness on Palpation: Yes Wound Preparation Ulcer Cleansing: Rinsed/Irrigated with Saline Topical Anesthetic Applied: Other: lidocaine 4%, Treatment Notes Wound #9 (Left Elbow) Notes PuraPly, steri strips, mepitel, gauze bolster and border foam dressing Electronic Signature(s) Signed: 01/02/2019 4:29:26 PM By: Rema JasmineNg, Wendi Entered By: Rema JasmineNg, Wendi on 01/02/2019 11:30:14 Natasha BassetHENRY, [name] ([id number]) -------------------------------------------------------------------------------- Vitals Details Patient Name: Natasha BassetHENRY, Clatie V. Date of Service: 01/02/2019 11:00 AM Medical Record Number: [id number] Patient Account Number: [id number] Date of Birth/Sex: 09/24/1930 (85 y.o. F) Treating RN: Huel CoventryWoody, Kim Primary Care Larisha Vencill: Aram BeechamSparks, Jeffrey Other Clinician: Referring Deniah Saia: Aram BeechamSparks, Jeffrey Treating Sayward Horvath/Extender: Altamese CarolinaOBSON, [name] Weeks in Treatment: 2859 Vital Signs Time Taken: 11:19 Temperature (F): 98.1 Height (in): 60 Pulse (bpm): 65 Weight (lbs): 123 Respiratory Rate (breaths/min): 18 Body Mass Index (BMI): 24 Blood Pressure (mmHg): 113/69 Reference Range: 80 - 120 mg / dl Electronic Signature(s) Signed: 01/02/2019 11:55:15 AM By: Dayton MartesWallace, RCP,RRT,CHT, Sallie RCP, RRT,  CHT Entered By: Dayton MartesWallace, RCP,RRT,CHT, Sallie on 01/02/2019 11:21:27

## 2019-01-04 NOTE — Progress Notes (Addendum)
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 Facility Procedures CPT4: Description Modifier Quantity Code [id number] 15271 - SKIN SUB GRAFT TRNK/ARM/LEG 1 ICD-10 Diagnosis Description 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

## 2019-01-08 ENCOUNTER — Ambulatory Visit: Payer: Medicare Other

## 2019-01-08 DIAGNOSIS — Z9181 History of falling: Secondary | ICD-10-CM

## 2019-01-08 DIAGNOSIS — M6281 Muscle weakness (generalized): Secondary | ICD-10-CM

## 2019-01-08 DIAGNOSIS — M545 Low back pain, unspecified: Secondary | ICD-10-CM

## 2019-01-08 DIAGNOSIS — R2681 Unsteadiness on feet: Secondary | ICD-10-CM

## 2019-01-08 DIAGNOSIS — R262 Difficulty in walking, not elsewhere classified: Secondary | ICD-10-CM

## 2019-01-08 DIAGNOSIS — G8929 Other chronic pain: Secondary | ICD-10-CM

## 2019-01-08 NOTE — Therapy (Signed)
[facility] [address] [place], [address] Phone: [phone]   Fax:  [phone]  Physical Therapy Treatment  Patient Details  Name: NAYA ILAGAN MRN: [id number] Date of Birth: 08-26-1930 Referring Provider (PT): Jennings Books, MD   Encounter Date: 01/08/2019  PT End of Session - 01/08/19 1442    Visit Number  24    Number of Visits  45    Date for PT Re-Evaluation  01/24/19    Authorization Type  5    Authorization Time Period  of 10 progress report    PT Start Time  1540    PT Stop Time  1532    PT Time Calculation (min)  50 min    Equipment Utilized During Treatment  Gait belt    Activity Tolerance  Patient tolerated treatment well    Behavior During Therapy  [facility] for tasks assessed/performed       Past Medical History:  Diagnosis Date  . Anemia   . GERD (gastroesophageal reflux disease)   . Hypertension   . Peripheral vascular disease (HCC)    possible neuropathies in lower extremeties  . PONV (postoperative nausea and vomiting)    happens sometimes but better with pre med of zofran  . Presence of permanent cardiac pacemaker   . Syncope     Past Surgical History:  Procedure Laterality Date  . ABDOMINAL HYSTERECTOMY  1969  . APPENDECTOMY  1969   with hysterectomy  . BACK SURGERY  2001   rods in back. surgery on back x 3  . COLONOSCOPY    . I&D EXTREMITY Left 03/27/2018   Procedure: IRRIGATION AND DEBRIDEMENT LEFT ELBOW / OLECRANON BURSA;  Surgeon: Corky Mull, MD;  Location: [facility];  Service: Orthopedics;  Laterality: Left;  . I&D EXTREMITY Left 06/05/2018   Procedure: IRRIGATION AND DEBRIDEMENT EXTREMITY;  Surgeon: Corky Mull, MD;  Location: [facility] OR[facility];  Service: Orthopedics;  Laterality: Left;  . INSERT / REPLACE / REMOVE PACEMAKER  2004  . JOINT REPLACEMENT Left 2004   partial knee replacement  . OLECRANON BURSECTOMY Left 03/27/2018   Procedure: LEFT OLECRANON BURSA;  Surgeon:  Corky Mull, MD;  Location: [facility] OR[facility];  Service: Orthopedics;  Laterality: Left;  . PACEMAKER INSERTION  2014   dual chamber for complete heart block    There were no vitals filed for this visit.  Subjective Assessment - 01/08/19 1443    Subjective  Feels ok. Not as perky as she would like. Did the bone density test. Wants exercises to build bones.  Wants to try the NuStep. Pt states having osteoporosis.     Pertinent History  Imparied gait. Pt states that she has had balance issues for too long. Feels more comfortable walking with her rw. Had tests which showed that she has neuropathy. However the doctor at [facility] said that her balance is coming from her back. Just wants to be strong so she can walk.  Started walking with her rw off and on for the past several months.  Also feels like she might need a new sleep number bed. Also had 2 wound surgeries at [name]. Pt fell in February 2019. Had to stop PT due to the L elbow surgery April 06, 2018 to remove a bursa. The wound still has not healed.   Currently sees a wound doctor.   Feels numbness L foot from the ankle down.  No R foot numbness.  Sometimes unsure of where her L LE is in space.  Pt also states that she tires too quickly.  Has not been able to walk her dog in 2 years. Has not really been doing exercises at home except the sit <> stand. Pt states that prolonged sitting without back support will increase back pain     Limitations  Standing;Walking    How long can you sit comfortably?  1 hr    How long can you stand comfortably?  10 minutes    How long can you walk comfortably?  2 or 3 blocks    Diagnostic tests  EMG testing BLEs and BUEs which were negative    Patient Stated Goals  Improve LE strength. Wants to use the machines at the gym. Walk more steadily. Be able to take her dog (16 lbs) out on a leash.     Currently in Pain?  No/denies    Pain Score  0-No pain    Pain Onset  More than a month ago                                PT Education - 01/08/19 1536    Education provided  Yes    Education Details  ther-ex    [employer]) Educated  Patient    Methods  Explanation;Demonstration;Tactile cues;Verbal cues    Comprehension  Returned demonstration;Verbalized understanding      Objectives  Pt ambulating with rw  TTP bilateral greater trochanter L >R   L elbow wound  No latex band allergies    MedbridgeAccess Code: [email or web address]   Gait training  Gait with SPC on R UE, CGA              250 ft, No LOB. Cues for increasing hip and knee flexion to promote foot clearance  Gait 10 meters with SPC CGA  17 seconds, 17 seconds, (0.59 m/s gait speed average)  Gait with PT hand held assist 32 ft forward and 32 ft backward 2x  Able to ambulate forward with min A  Able to ambulate backward with mod A  Improved technique, movement at target joints, use of target muscles after min to mod verbal, visual, tactile cues.   Improved gait with SPC with cues for increased hip flexion    Therapeutic exercise  Leg press seat 2,   B LE plate 45 for 10U7  Cues for femoral control  Performed to promote LE strengthening (glute and quad) as well as for weight bearing exercise to help promote bone density. Good glute muscle use felt.   Standing hip abduction with B UE assit 2 lbs ankle weights  R LE 10x3   L LE 10x3  Good glute med muscle use felt  Forward step ups onto 4 inch step with one UE assist 10x each LE  Nustep seat 5, at level 1x 5 minutes. (3 min unbilled). Cues for technique and pacing.    Improved exercise technique, movement at target joints, use of target muscles after min to mod verbal, visual, tactile cues.      Pt able to ambulate forward with min A hand held assist from PT and able to ambulate backwards with mod A. Improving balance confidence observed. Pt also able to ambulate 250 ft with SPC and CGA, no LOB as well as able to  perform the 10 MWT today using SPC with CGA at 0.59 m/s. Continued working on LE strengthening  as well to promote bone density as well as improve ability to perform standing tasks with less difficulty. Pt tolerated session well without complain of pain. Pt will benefit from continued skilled physical therapy services to improve strength, balance, and function.                  PT Short Term Goals - 12/13/18 1651      PT SHORT TERM GOAL #1   Title  Patient will be independent with her HEP to improve strength and balance.     Time  3    Period  Weeks    Status  On-going    Target Date  01/03/19        PT Long Term Goals - 01/08/19 1539      PT LONG TERM GOAL #1   Title  Patient will improve her 10 MWT to 0.8 m/s or more with rw to promote better community ambulation.     Baseline  0.5 seconds with rw (09/06/2018); 0.63 seconds average with rw (10/18/2018); 0.69 m/s average using rw (12/10/2018); 0.59 m/s using SPC and CGA (01/08/2019)    Time  6    Period  Weeks    Status  On-going    Target Date  01/24/19      PT LONG TERM GOAL #2   Title  Patient will improve her DGI score using rw or least restrictive AD to 19/24 or more to promote balance.     Baseline  13/24 using rw (09/06/2018); 14/24 using rw (10/29/2018); 16/24 (12/10/2018)    Time  6    Period  Weeks    Status  On-going    Target Date  01/24/19      PT LONG TERM GOAL #3   Title  Patient will improve bilateral LE strength by at least 1/2 MMT to promote ability to support herself when performing standing tasks and improve balance.     Time  6    Period  Weeks    Status  Partially Met    Target Date  01/24/19      PT LONG TERM GOAL #4   Title  Patient will have a decrease in back pain/ache to 5/10 or less to promote ability to perform standing tasks with less difficulty.    Baseline  8/10 at most for the past 3 months (09/06/2018); 5-6/10 at worst for the past 7 days (10/18/2018); 5/10 at worst for the past 7 days  (12/10/2018)    Time  6    Period  Weeks    Status  Achieved            Plan - 01/08/19 1536    Clinical Impression Statement  Pt able to ambulate forward with min A hand held assist from PT and able to ambulate backwards with mod A. Improving balance confidence observed. Pt also able to ambulate 250 ft with SPC and CGA, no LOB as well as able to perform the 10 MWT today using SPC with CGA at 0.59 m/s. Continued working on LE strengthening as well to promote bone density as well as improve ability to perform standing tasks with less difficulty. Pt tolerated session well without complain of pain. Pt will benefit from continued skilled physical therapy services to improve strength, balance, and function.      Rehab Potential  Fair    Clinical Impairments Affecting Rehab Potential  (-) chronicity of condition, age, comorbidities; (+) motivated    PT Frequency  2x /  week    PT Duration  6 weeks    PT Treatment/Interventions  Aquatic Therapy;Gait training;Therapeutic activities;Therapeutic exercise;Balance training;Neuromuscular re-education;Patient/family education;Manual techniques;Dry needling    PT Next Visit Plan  trunk, hip, scapular strengthening, balance, gait, manual techniques PRN    Consulted and Agree with Plan of Care  Patient       Patient will benefit from skilled therapeutic intervention in order to improve the following deficits and impairments:  Abnormal gait, Decreased activity tolerance, Decreased balance, Decreased endurance, Decreased range of motion, Decreased strength, Difficulty walking, Improper body mechanics, Postural dysfunction, Pain, Decreased knowledge of use of DME  Visit Diagnosis: Unsteadiness on feet  Difficulty in walking, not elsewhere classified  Muscle weakness (generalized)  History of falling  Chronic low back pain, unspecified back pain laterality, unspecified whether sciatica present     Problem List Patient Active Problem List    Diagnosis Date Noted  . Septic olecranon bursitis of left elbow 03/27/2018  . Lymphedema 10/11/2017  . Chronic venous insufficiency 10/11/2017  . Leg pain 10/11/2017  . Leg swelling 10/11/2017  . COPD (chronic obstructive pulmonary disease) ([place]) 10/11/2017  . Essential hypertension 10/11/2017    Joneen Boers PT, DPT   01/08/2019, 3:44 PM  [facility] [address], [place], [address] Phone: [phone]   Fax:  [phone]  Name: ROSHA COCKER MRN: [id number] Date of Birth: 1930/11/28

## 2019-01-09 ENCOUNTER — Encounter: Payer: Medicare Other | Admitting: Internal Medicine

## 2019-01-09 DIAGNOSIS — L98495 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: Secondary | ICD-10-CM | POA: Diagnosis not present

## 2019-01-10 ENCOUNTER — Ambulatory Visit: Payer: Medicare Other

## 2019-01-10 DIAGNOSIS — M545 Low back pain: Secondary | ICD-10-CM

## 2019-01-10 DIAGNOSIS — R2681 Unsteadiness on feet: Secondary | ICD-10-CM

## 2019-01-10 DIAGNOSIS — R262 Difficulty in walking, not elsewhere classified: Secondary | ICD-10-CM

## 2019-01-10 DIAGNOSIS — Z9181 History of falling: Secondary | ICD-10-CM

## 2019-01-10 DIAGNOSIS — M6281 Muscle weakness (generalized): Secondary | ICD-10-CM

## 2019-01-10 DIAGNOSIS — G8929 Other chronic pain: Secondary | ICD-10-CM

## 2019-01-10 NOTE — Therapy (Signed)
[facility] [address], [place], [address] Phone: [phone]   Fax:  [phone]  Physical Therapy Treatment  Patient Details  Name: [name] MRN: [id number] Date of Birth: 1930-04-20 Referring Provider (PT): Jennings Books, MD   Encounter Date: 01/10/2019  PT End of Session - 01/10/19 1610    Visit Number  25    Number of Visits  45    Date for PT Re-Evaluation  01/24/19    Authorization Type  6    Authorization Time Period  of 10 progress report    PT Start Time  1610    PT Stop Time  1653    PT Time Calculation (min)  43 min    Equipment Utilized During Treatment  Gait belt    Activity Tolerance  Patient tolerated treatment well    Behavior During Therapy  [facility] for tasks assessed/performed       Past Medical History:  Diagnosis Date  . Anemia   . GERD (gastroesophageal reflux disease)   . Hypertension   . Peripheral vascular disease (HCC)    possible neuropathies in lower extremeties  . PONV (postoperative nausea and vomiting)    happens sometimes but better with pre med of zofran  . Presence of permanent cardiac pacemaker   . Syncope     Past Surgical History:  Procedure Laterality Date  . ABDOMINAL HYSTERECTOMY  1969  . APPENDECTOMY  1969   with hysterectomy  . BACK SURGERY  2001   rods in back. surgery on back x 3  . COLONOSCOPY    . I&D EXTREMITY Left 03/27/2018   Procedure: IRRIGATION AND DEBRIDEMENT LEFT ELBOW / OLECRANON BURSA;  Surgeon: Corky Mull, MD;  Location: [facility];  Service: Orthopedics;  Laterality: Left;  . I&D EXTREMITY Left 06/05/2018   Procedure: IRRIGATION AND DEBRIDEMENT EXTREMITY;  Surgeon: Corky Mull, MD;  Location: [facility];  Service: Orthopedics;  Laterality: Left;  . INSERT / REPLACE / REMOVE PACEMAKER  2004  . JOINT REPLACEMENT Left 2004   partial knee replacement  . OLECRANON BURSECTOMY Left 03/27/2018   Procedure: LEFT OLECRANON BURSA;  Surgeon:  Corky Mull, MD;  Location: [facility] OR[facility];  Service: Orthopedics;  Laterality: Left;  . PACEMAKER INSERTION  2014   dual chamber for complete heart block    There were no vitals filed for this visit.  Subjective Assessment - 01/10/19 1613    Subjective  Pt states that she enjoyed the therapy last session. Not sore. Was pleasantly surprised.     Pertinent History  Imparied gait. Pt states that she has had balance issues for too long. Feels more comfortable walking with her rw. Had tests which showed that she has neuropathy. However the doctor at [facility] said that her balance is coming from her back. Just wants to be strong so she can walk.  Started walking with her rw off and on for the past several months.  Also feels like she might need a new sleep number bed. Also had 2 wound surgeries at [name]. Pt fell in February 2019. Had to stop PT due to the L elbow surgery April 06, 2018 to remove a bursa. The wound still has not healed.   Currently sees a wound doctor.   Feels numbness L foot from the ankle down.  No R foot numbness.  Sometimes unsure of where her L LE is in space.  Pt also states that  she tires too quickly.  Has not been able to walk her dog in 2 years. Has not really been doing exercises at home except the sit <> stand. Pt states that prolonged sitting without back support will increase back pain     Limitations  Standing;Walking    How long can you sit comfortably?  1 hr    How long can you stand comfortably?  10 minutes    How long can you walk comfortably?  2 or 3 blocks    Diagnostic tests  EMG testing BLEs and BUEs which were negative    Patient Stated Goals  Improve LE strength. Wants to use the machines at the gym. Walk more steadily. Be able to take her dog (16 lbs) out on a leash.     Currently in Pain?  No/denies    Pain Score  0-No pain    Pain Onset  More than a month ago                               PT Education - 01/10/19 1626     Education provided  Yes    Education Details  ther-ex    [employer]) Educated  Patient    Methods  Explanation;Demonstration;Tactile cues;Verbal cues    Comprehension  Returned demonstration;Verbalized understanding       Objectives  Pt ambulating with rw  TTP bilateral greater trochanter L >R   L elbow wound  No latex band allergies    [employer] Code: [email or web address]   Gait training  Gait with PT hand held assist 200 ft forward, min A. No LOB  Gait with PT hand held assist 32 ft forward and 32 ft backward 2x             Able to ambulate forward with min A             Able to ambulate backward with mod A  Improved technique, movement at target joints, use of target muscles after min to mod verbal, visual, tactile cues.   Able to ambulate longer distances with hand held PT min A without LOB.     Therapeutic exercise  Leg press seat 2,              B LE plate 45 for [id number]             Cues for femoral control             Performed to promote LE strengthening (glute and quad) as well as for weight bearing exercise to help promote bone density. Good glute muscle use felt.   Standing hip abduction with B UE assit 2 lbs ankle weights             R LE 10x3              L LE 10x3             Good glute med muscle use felt  Seated LAQ 3 lbs  R 10x3  L 10x3  Forward step up onto 4 inch step with one UE assist  R LE 10x2  L LE 10x2  Stepping over 4 mini hurdles with one UE assist and CGA to promote balance, obstacle negotiation, and foot clearance 8x. Improved ability to clear hurdles with practice and cues for increasing hip flexion.   Standing mini squats with B UE light touch assist 5x5 seconds  Improved exercise technique, movement at target joints, use of target muscles after min to mod verbal, visual, tactile cues.    Pt tolerated session well without aggravation of symptoms. Improving ability to ambulate with less assist from rw.       Continued working on LE strengthening and weightbearing to promote balance and ability to perform standing tasks with less difficulty. Continued working on gait training to promote ability to ambulate with less difficulty. Improving ability to ambulate with either a SPC or one hand held assist from PT without LOB and with longer distances. Pt will benefit from continued skilled physical therapy services to improve strength, gait, and function.             PT Short Term Goals - 12/13/18 1651      PT SHORT TERM GOAL #1   Title  Patient will be independent with her HEP to improve strength and balance.     Time  3    Period  Weeks    Status  On-going    Target Date  01/03/19        PT Long Term Goals - 01/08/19 1539      PT LONG TERM GOAL #1   Title  Patient will improve her 10 MWT to 0.8 m/s or more with rw to promote better community ambulation.     Baseline  0.5 seconds with rw (09/06/2018); 0.63 seconds average with rw (10/18/2018); 0.69 m/s average using rw (12/10/2018); 0.59 m/s using SPC and CGA (01/08/2019)    Time  6    Period  Weeks    Status  On-going    Target Date  01/24/19      PT LONG TERM GOAL #2   Title  Patient will improve her DGI score using rw or least restrictive AD to 19/24 or more to promote balance.     Baseline  13/24 using rw (09/06/2018); 14/24 using rw (10/29/2018); 16/24 (12/10/2018)    Time  6    Period  Weeks    Status  On-going    Target Date  01/24/19      PT LONG TERM GOAL #3   Title  Patient will improve bilateral LE strength by at least 1/2 MMT to promote ability to support herself when performing standing tasks and improve balance.     Time  6    Period  Weeks    Status  Partially Met    Target Date  01/24/19      PT LONG TERM GOAL #4   Title  Patient will have a decrease in back pain/ache to 5/10 or less to promote ability to perform standing tasks with less difficulty.    Baseline  8/10 at most for the past 3 months (09/06/2018);  5-6/10 at worst for the past 7 days (10/18/2018); 5/10 at worst for the past 7 days (12/10/2018)    Time  6    Period  Weeks    Status  Achieved            Plan - 01/10/19 1627    Clinical Impression Statement  Continued working on LE strengthening and weightbearing to promote balance and ability to perform standing tasks with less difficulty. Continued working on gait training to promote ability to ambulate with less difficulty. Improving ability to ambulate with either a SPC or one hand held assist from PT without LOB and with longer distances. Pt will benefit from continued skilled physical therapy services to improve strength, gait, and  function.     Rehab Potential  Fair    Clinical Impairments Affecting Rehab Potential  (-) chronicity of condition, age, comorbidities; (+) motivated    PT Frequency  2x / week    PT Duration  6 weeks    PT Treatment/Interventions  Aquatic Therapy;Gait training;Therapeutic activities;Therapeutic exercise;Balance training;Neuromuscular re-education;Patient/family education;Manual techniques;Dry needling    PT Next Visit Plan  trunk, hip, scapular strengthening, balance, gait, manual techniques PRN    Consulted and Agree with Plan of Care  Patient       Patient will benefit from skilled therapeutic intervention in order to improve the following deficits and impairments:  Abnormal gait, Decreased activity tolerance, Decreased balance, Decreased endurance, Decreased range of motion, Decreased strength, Difficulty walking, Improper body mechanics, Postural dysfunction, Pain, Decreased knowledge of use of DME  Visit Diagnosis: Unsteadiness on feet  Difficulty in walking, not elsewhere classified  Muscle weakness (generalized)  History of falling  Chronic low back pain, unspecified back pain laterality, unspecified whether sciatica present     Problem List Patient Active Problem List   Diagnosis Date Noted  . Septic olecranon bursitis of left  elbow 03/27/2018  . Lymphedema 10/11/2017  . Chronic venous insufficiency 10/11/2017  . Leg pain 10/11/2017  . Leg swelling 10/11/2017  . COPD (chronic obstructive pulmonary disease) ([place]) 10/11/2017  . Essential hypertension 10/11/2017    Joneen Boers PT, DPT   01/10/2019, 5:26 PM  [place] [facility] [address] [place], [address] Phone: [phone]   Fax:  [phone]  Name: [name] MRN: [id number] Date of Birth: Jun 09, 1930

## 2019-01-11 NOTE — Progress Notes (Signed)
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 [id number] Date of Birth/Sex: [birth year] (85 y.o. 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

## 2019-01-15 ENCOUNTER — Ambulatory Visit: Payer: Medicare Other | Attending: Neurology

## 2019-01-15 DIAGNOSIS — M6281 Muscle weakness (generalized): Secondary | ICD-10-CM | POA: Insufficient documentation

## 2019-01-15 DIAGNOSIS — G8929 Other chronic pain: Secondary | ICD-10-CM | POA: Diagnosis present

## 2019-01-15 DIAGNOSIS — M545 Low back pain: Secondary | ICD-10-CM | POA: Diagnosis present

## 2019-01-15 DIAGNOSIS — R2681 Unsteadiness on feet: Secondary | ICD-10-CM | POA: Diagnosis present

## 2019-01-15 DIAGNOSIS — R262 Difficulty in walking, not elsewhere classified: Secondary | ICD-10-CM

## 2019-01-15 DIAGNOSIS — Z9181 History of falling: Secondary | ICD-10-CM

## 2019-01-15 NOTE — Patient Instructions (Signed)
MedbridgeAccess Code: [email or web address]  Standing Hip Abduction with Counter Support   Added 2 lbs ankle weight resistance 10x3 each LE

## 2019-01-15 NOTE — Therapy (Signed)
[facility] [address] [place], [address] Phone: [phone]   Fax:  [phone]  Physical Therapy Treatment  Patient Details  Name: [name] MRN: [id number] Date of Birth: 1930-02-12 Referring Provider (PT): Jennings Books, MD   Encounter Date: 01/15/2019  PT End of Session - 01/15/19 1038    Visit Number  26    Number of Visits  45    Date for PT Re-Evaluation  01/24/19    Authorization Type  7    Authorization Time Period  of 10 progress report    PT Start Time  1038   pt arrived late   PT Stop Time  1118    PT Time Calculation (min)  40 min    Equipment Utilized During Treatment  Gait belt    Activity Tolerance  Patient tolerated treatment well    Behavior During Therapy  WFL for tasks assessed/performed       Past Medical History:  Diagnosis Date  . Anemia   . GERD (gastroesophageal reflux disease)   . Hypertension   . Peripheral vascular disease (HCC)    possible neuropathies in lower extremeties  . PONV (postoperative nausea and vomiting)    happens sometimes but better with pre med of zofran  . Presence of permanent cardiac pacemaker   . Syncope     Past Surgical History:  Procedure Laterality Date  . ABDOMINAL HYSTERECTOMY  1969  . APPENDECTOMY  1969   with hysterectomy  . BACK SURGERY  2001   rods in back. surgery on back x 3  . COLONOSCOPY    . I&D EXTREMITY Left 03/27/2018   Procedure: IRRIGATION AND DEBRIDEMENT LEFT ELBOW / OLECRANON BURSA;  Surgeon: Corky Mull, MD;  Location: [facility];  Service: Orthopedics;  Laterality: Left;  . I&D EXTREMITY Left 06/05/2018   Procedure: IRRIGATION AND DEBRIDEMENT EXTREMITY;  Surgeon: Corky Mull, MD;  Location: [facility];  Service: Orthopedics;  Laterality: Left;  . INSERT / REPLACE / REMOVE PACEMAKER  2004  . JOINT REPLACEMENT Left 2004   partial knee replacement  . OLECRANON BURSECTOMY Left 03/27/2018   Procedure: LEFT OLECRANON  BURSA;  Surgeon: Corky Mull, MD;  Location: [facility];  Service: Orthopedics;  Laterality: Left;  . PACEMAKER INSERTION  2014   dual chamber for complete heart block    There were no vitals filed for this visit.  Subjective Assessment - 01/15/19 1039    Subjective  Found 2 lb ankle weights at a store for her hip exercises.     Pertinent History  Imparied gait. Pt states that she has had balance issues for too long. Feels more comfortable walking with her rw. Had tests which showed that she has neuropathy. However the doctor at [facility] said that her balance is coming from her back. Just wants to be strong so she can walk.  Started walking with her rw off and on for the past several months.  Also feels like she might need a new sleep number bed. Also had 2 wound surgeries at [name]. Pt fell in February 2019. Had to stop PT due to the L elbow surgery April 06, 2018 to remove a bursa. The wound still has not healed.   Currently sees a wound doctor.   Feels numbness L foot from the ankle down.  No R foot numbness.  Sometimes unsure of where her L LE is in space.  Pt also  states that she tires too quickly.  Has not been able to walk her dog in 2 years. Has not really been doing exercises at home except the sit <> stand. Pt states that prolonged sitting without back support will increase back pain     Limitations  Standing;Walking    How long can you sit comfortably?  1 hr    How long can you stand comfortably?  10 minutes    How long can you walk comfortably?  2 or 3 blocks    Diagnostic tests  EMG testing BLEs and BUEs which were negative    Patient Stated Goals  Improve LE strength. Wants to use the machines at the gym. Walk more steadily. Be able to take her dog (16 lbs) out on a leash.     Currently in Pain?  No/denies    Pain Score  0-No pain   in low back   Pain Onset  More than a month ago                               PT Education - 01/15/19 1639     Education provided  Yes    Education Details  ther-ex, HEP    Person(s) Educated  Patient    Methods  Explanation;Demonstration;Tactile cues;Verbal cues;Handout    Comprehension  Returned demonstration;Verbalized understanding      Objectives  Pt ambulating with rw  TTP bilateral greater trochanter L >R   L elbow wound  No latex band allergies    MedbridgeAccess Code: [email or web address]   Gait training   Gait with SPC 250 ft, LOBx1, independent recovery with use of furniture. SBA (instead of CGA) today.   Pt seems to have gotten afraid of people walking in front of her and resulted in LOB    Gait with SPC 200 ft with another person walking across and around pt as well as with sudden stops as signaled by pt to avoid obstacle/person to help pt with fear of walking with people in front of her. CGA, no LOB.   Gait throughout gym with PT hand held assist to no assist but CGA. Able to ambulate short distances without help but CGA    Improved technique, movement at target joints, use of target muscles after min to mod verbal, visual, tactile cues.  Pt demonstrates fear of walking with people around her which may have played a roll in losing balance 1x. Added gait with a person walking around her and across her with PT signals for sudden stops to help avoid person at times. No LOB with the latter activity.   Therapeutic exercise  Leg press seat 2,  B LE plate 45 for 25O0 Cues for femoral control Performed to promote LE strengthening (glute and quad) as well as for weight bearing exercise to help promote bone density.  Standing hip abduction with B UE assist 2 lbs ankle weights R LE 10x3  L LE 10x3 Good glute med muscle use felt  Seated LAQ 3 lbs             R 10x3             L 10x3  Forward step up onto 4 inch step with one UE to 2 UE assist             R LE 10x2             L LE  10x2  Standing static mini squat 8x5 seconds     Improved exercise technique, movement at target joints, use of target muscles after min to mod verbal, visual, tactile cues.    Pt tolerated session well without aggravation of symptoms.   Continued working on LE strengthening to promote ability to ambulate with least restrictive assistive device.Pt demonstrates fear of walking with people around her which may have played a roll in losing balance 1x while walking with a SPC. Added gait with a person walking around her and across her with PT signals for sudden stops to help avoid person at times. No LOB with the latter activity. Able to ambulate short distances without use of AD but with CGA. Pt seems to be improving ambulation overall. Pt will benefit from continued skilled physical therapy services to improve strength, function, balance, and ability to ambulate.     PT Short Term Goals - 12/13/18 1651      PT SHORT TERM GOAL #1   Title  Patient will be independent with her HEP to improve strength and balance.     Time  3    Period  Weeks    Status  On-going    Target Date  01/03/19        PT Long Term Goals - 01/08/19 1539      PT LONG TERM GOAL #1   Title  Patient will improve her 10 MWT to 0.8 m/s or more with rw to promote better community ambulation.     Baseline  0.5 seconds with rw (09/06/2018); 0.63 seconds average with rw (10/18/2018); 0.69 m/s average using rw (12/10/2018); 0.59 m/s using SPC and CGA (01/08/2019)    Time  6    Period  Weeks    Status  On-going    Target Date  01/24/19      PT LONG TERM GOAL #2   Title  Patient will improve her DGI score using rw or least restrictive AD to 19/24 or more to promote balance.     Baseline  13/24 using rw (09/06/2018); 14/24 using rw (10/29/2018); 16/24 (12/10/2018)    Time  6    Period  Weeks    Status  On-going    Target Date  01/24/19      PT LONG TERM GOAL #3   Title  Patient will improve bilateral LE strength  by at least 1/2 MMT to promote ability to support herself when performing standing tasks and improve balance.     Time  6    Period  Weeks    Status  Partially Met    Target Date  01/24/19      PT LONG TERM GOAL #4   Title  Patient will have a decrease in back pain/ache to 5/10 or less to promote ability to perform standing tasks with less difficulty.    Baseline  8/10 at most for the past 3 months (09/06/2018); 5-6/10 at worst for the past 7 days (10/18/2018); 5/10 at worst for the past 7 days (12/10/2018)    Time  6    Period  Weeks    Status  Achieved            Plan - 01/15/19 1647    Clinical Impression Statement  Continued working on LE strengthening to promote ability to ambulate with least restrictive assistive device.Pt demonstrates fear of walking with people around her which may have played a roll in losing balance 1x while walking with a SPC. Added gait with a  person walking around her and across her with PT signals for sudden stops to help avoid person at times. No LOB with the latter activity. Able to ambulate short distances without use of AD but with CGA. Pt seems to be improving ambulation overall. Pt will benefit from continued skilled physical therapy services to improve strength, function, balance, and ability to ambulate.     Rehab Potential  Fair    Clinical Impairments Affecting Rehab Potential  (-) chronicity of condition, age, comorbidities; (+) motivated    PT Frequency  2x / week    PT Duration  6 weeks    PT Treatment/Interventions  Aquatic Therapy;Gait training;Therapeutic activities;Therapeutic exercise;Balance training;Neuromuscular re-education;Patient/family education;Manual techniques;Dry needling    PT Next Visit Plan  trunk, hip, scapular strengthening, balance, gait, manual techniques PRN    Consulted and Agree with Plan of Care  Patient       Patient will benefit from skilled therapeutic intervention in order to improve the following deficits and  impairments:  Abnormal gait, Decreased activity tolerance, Decreased balance, Decreased endurance, Decreased range of motion, Decreased strength, Difficulty walking, Improper body mechanics, Postural dysfunction, Pain, Decreased knowledge of use of DME  Visit Diagnosis: Unsteadiness on feet  Difficulty in walking, not elsewhere classified  Muscle weakness (generalized)  History of falling     Problem List Patient Active Problem List   Diagnosis Date Noted  . Septic olecranon bursitis of left elbow 03/27/2018  . Lymphedema 10/11/2017  . Chronic venous insufficiency 10/11/2017  . Leg pain 10/11/2017  . Leg swelling 10/11/2017  . COPD (chronic obstructive pulmonary disease) ([place]) 10/11/2017  . Essential hypertension 10/11/2017    Joneen Boers PT, DPT   01/15/2019, 4:49 PM  [facility] [address] [place], [address] Phone: [phone]   Fax:  [phone]  Name: LATAUSHA FLAMM MRN: [id number] Date of Birth: [birth year]

## 2019-01-16 ENCOUNTER — Encounter: Payer: Medicare Other | Attending: Internal Medicine | Admitting: Internal Medicine

## 2019-01-16 DIAGNOSIS — L98495 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: Secondary | ICD-10-CM | POA: Diagnosis not present

## 2019-01-16 DIAGNOSIS — M351 Other overlap syndromes: Secondary | ICD-10-CM | POA: Insufficient documentation

## 2019-01-16 DIAGNOSIS — M419 Scoliosis, unspecified: Secondary | ICD-10-CM | POA: Diagnosis not present

## 2019-01-16 DIAGNOSIS — M069 Rheumatoid arthritis, unspecified: Secondary | ICD-10-CM | POA: Diagnosis not present

## 2019-01-16 DIAGNOSIS — Z950 Presence of cardiac pacemaker: Secondary | ICD-10-CM | POA: Diagnosis not present

## 2019-01-16 DIAGNOSIS — S51002A Unspecified open wound of left elbow, initial encounter: Secondary | ICD-10-CM | POA: Insufficient documentation

## 2019-01-16 DIAGNOSIS — X58XXXA Exposure to other specified factors, initial encounter: Secondary | ICD-10-CM | POA: Insufficient documentation

## 2019-01-16 DIAGNOSIS — L97905 Non-pressure chronic ulcer of unspecified part of unspecified lower leg with muscle involvement without evidence of necrosis: Secondary | ICD-10-CM | POA: Insufficient documentation

## 2019-01-16 DIAGNOSIS — S51002S Unspecified open wound of left elbow, sequela: Secondary | ICD-10-CM | POA: Insufficient documentation

## 2019-01-17 ENCOUNTER — Ambulatory Visit: Payer: Medicare Other

## 2019-01-17 NOTE — Progress Notes (Signed)
[name], [name] ([id number]) Visit Report for 01/16/2019 Arrival Information Details Patient Name: [name], [name] Date of Service: 01/16/2019 11:00 AM Medical Record Number: [id number] Patient Account Number: [id number] Date of Birth/Sex: 08/23/1930 (85 y.o. F) Treating RN: Huel CoventryWoody, Kim Primary Care Adarrius Graeff: Aram BeechamSparks, Jeffrey Other Clinician: Referring Laterica Matarazzo: Aram BeechamSparks, Jeffrey Treating Draedyn Weidinger/Extender: Altamese CarolinaOBSON, [name] Weeks in Treatment: 4261 Visit Information History Since Last Visit Added or deleted any medications: No Patient Arrived: Walker Any new allergies or adverse reactions: No Arrival Time: 11:07 Had a fall or experienced change in No Accompanied By: self activities of daily living that may affect Transfer Assistance: None risk of falls: Patient Identification Verified: Yes Signs or symptoms of abuse/neglect since last visito No Secondary Verification Process Yes Hospitalized since last visit: No Completed: Implantable device outside of the clinic excluding No Patient Requires Transmission-Based No cellular tissue based products placed in the center Precautions: since last visit: Patient Has Alerts: Yes Has Dressing in Place as Prescribed: Yes Patient Alerts: non compressible left Pain Present Now: No leg Electronic Signature(s) Signed: 01/16/2019 11:35:01 AM By: Dayton MartesWallace, RCP,RRT,CHT, Sallie RCP, RRT, CHT Entered By: Dayton MartesWallace, RCP,RRT,CHT, Sallie on 01/16/2019 11:11:04 [name], [name] ([id number]) -------------------------------------------------------------------------------- Lower Extremity Assessment Details Patient Name: [name], [name] Date of Service: 01/16/2019 11:00 AM Medical Record Number: [id number] Patient Account Number: [id number] Date of Birth/Sex: 01/30/1930 (85 y.o. F) Treating RN: Curtis Sitesorthy, Joanna Primary Care Aleksa Collinsworth: Aram BeechamSparks, Jeffrey Other Clinician: Referring Keerat Denicola: Aram BeechamSparks, Jeffrey Treating Tavaras Goody/Extender: Altamese CarolinaOBSON, [name] Weeks in  Treatment: 461 Electronic Signature(s) Signed: 01/16/2019 5:48:53 PM By: Curtis Sitesorthy, Joanna Entered By: Curtis Sitesorthy, Joanna on 01/16/2019 11:30:22 [name], [name] ([id number]) -------------------------------------------------------------------------------- Multi Wound Chart Details Patient Name: [name], [name] Date of Service: 01/16/2019 11:00 AM Medical Record Number: [id number] Patient Account Number: [id number] Date of Birth/Sex: 06/30/1930 (85 y.o. F) Treating RN: Huel CoventryWoody, Kim Primary Care Jakhari Space: Aram BeechamSparks, Jeffrey Other Clinician: Referring Alaira Level: Aram BeechamSparks, Jeffrey Treating Nikolay Demetriou/Extender: Altamese CarolinaOBSON, [name] Weeks in Treatment: 8661 Vital Signs Height(in): 60 Pulse(bpm): 66 Weight(lbs): 123 Blood Pressure(mmHg): 123/51 Body Mass Index(BMI): 24 Temperature(F): 98.0 Respiratory Rate 18 (breaths/min): Photos: [N/A:N/A] Wound Location: Left Elbow N/A N/A Wounding Event: Surgical Injury N/A N/A Primary Etiology: Open Surgical Wound N/A N/A Comorbid History: Cataracts, Glaucoma, Optic N/A N/A Neuritis, Chronic sinus problems/congestion, Middle ear problems Date Acquired: 01/26/2018 N/A N/A Weeks of Treatment: 37 N/A N/A Wound Status: Open N/A N/A Measurements L x W x D 0.7x0.5x0.2 N/A N/A (cm) Area (cm) : 0.275 N/A N/A Volume (cm) : 0.055 N/A N/A % Reduction in Area: -287.30% N/A N/A % Reduction in Volume: -292.90% N/A N/A Starting Position 1 3 (o'clock): Ending Position 1 7 (o'clock): Maximum Distance 1 (cm): 0.6 Undermining: Yes N/A N/A Classification: Full Thickness With Exposed N/A N/A Support Structures Exudate Amount: Medium N/A N/A Exudate Type: Purulent N/A N/A Exudate Color: yellow, brown, green N/A N/A Wound Margin: Distinct, outline attached N/A N/A Granulation Amount: Small (1-33%) N/A N/A [name], [name] ([id number]) Granulation Quality: Pale N/A N/A Necrotic Amount: Medium (34-66%) N/A N/A Exposed Structures: Fat Layer (Subcutaneous N/A N/A Tissue)  Exposed: Yes Fascia: No Tendon: No Muscle: No Joint: No Bone: No Epithelialization: None N/A N/A Debridement: Debridement - Excisional N/A N/A Pre-procedure 11:54 N/A N/A Verification/Time Out Taken: Pain Control: Lidocaine N/A N/A Tissue Debrided: Subcutaneous, Slough N/A N/A Level: Skin/Subcutaneous Tissue N/A N/A Debridement Area (sq cm): 0.35 N/A N/A Instrument: Curette N/A N/A Bleeding: Minimum N/A N/A Hemostasis Achieved: Pressure N/A N/A Debridement Treatment Procedure was tolerated well N/A N/A Response: Post Debridement  0.7x0.5x0.2 N/A N/A Measurements L x W x D (cm) Post Debridement Volume: 0.055 N/A N/A (cm) Periwound Skin Texture: Excoriation: No N/A N/A Induration: No Callus: No Crepitus: No Rash: No Scarring: No Periwound Skin Moisture: Maceration: Yes N/A N/A Dry/Scaly: No Periwound Skin Color: Erythema: Yes N/A N/A Atrophie Blanche: No Cyanosis: No Ecchymosis: No Hemosiderin Staining: No Mottled: No Pallor: No Rubor: No Erythema Location: Circumferential N/A N/A Temperature: No Abnormality N/A N/A Tenderness on Palpation: Yes N/A N/A Wound Preparation: Ulcer Cleansing: N/A N/A Rinsed/Irrigated with Saline Topical Anesthetic Applied: Other: lidocaine 4% Procedures Performed: Cellular or Tissue Based N/A N/A Product Debridement Treatment Notes [name], [name] ([id number]) Electronic Signature(s) Signed: 01/16/2019 5:51:47 PM By: Baltazar Najjarobson, Michael MD Entered By: Baltazar Najjarobson, Michael on 01/16/2019 12:37:54 [name], [name] ([id number]) -------------------------------------------------------------------------------- Multi-Disciplinary Care Plan Details Patient Name: [name], [name] Date of Service: 01/16/2019 11:00 AM Medical Record Number: [id number] Patient Account Number: [id number] Date of Birth/Sex: 06/06/1930 (85 y.o. F) Treating RN: Huel CoventryWoody, Kim Primary Care Tysheem Accardo: Aram BeechamSparks, Jeffrey Other Clinician: Referring Artist Bloom: Aram BeechamSparks,  Jeffrey Treating Windle Huebert/Extender: Altamese CarolinaOBSON, [name] Weeks in Treatment: 3261 Active Inactive Soft Tissue Infection Nursing Diagnoses: Impaired tissue integrity Goals: Patient's soft tissue infection will resolve Date Initiated: 07/18/2018 Target Resolution Date: 07/27/2018 Goal Status: Active Interventions: Assess signs and symptoms of infection every visit Treatment Activities: Culture and sensitivity : 07/18/2018 Systemic antibiotics : 07/18/2018 Notes: Wound/Skin Impairment Nursing Diagnoses: Impaired tissue integrity Knowledge deficit related to ulceration/compromised skin integrity Goals: Patient/caregiver will verbalize understanding of skin care regimen Date Initiated: 11/14/2017 Target Resolution Date: 11/28/2017 Goal Status: Active Ulcer/skin breakdown will have a volume reduction of 30% by week 4 Date Initiated: 11/14/2017 Target Resolution Date: 11/28/2017 Goal Status: Active Interventions: Assess patient/caregiver ability to obtain necessary supplies Assess patient/caregiver ability to perform ulcer/skin care regimen upon admission and as needed Assess ulceration(s) every visit Treatment Activities: Skin care regimen initiated : 11/14/2017 [name], Jacqulyne V. ([id number]) Notes: Electronic Signature(s) Signed: 01/16/2019 5:28:11 PM By: Elliot GurneyWoody, BSN, RN, CWS, Kim RN, BSN Entered By: Elliot GurneyWoody, BSN, RN, CWS, Kim on 01/16/2019 11:54:45 [name], [name] ([id number]) -------------------------------------------------------------------------------- Pain Assessment Details Patient Name: [name], [name] Date of Service: 01/16/2019 11:00 AM Medical Record Number: [id number] Patient Account Number: [id number] Date of Birth/Sex: 04/03/1930 (85 y.o. F) Treating RN: Huel CoventryWoody, Kim Primary Care Kennen Stammer: Aram BeechamSparks, Jeffrey Other Clinician: Referring Damarkus Balis: Aram BeechamSparks, Jeffrey Treating Henrietta Cieslewicz/Extender: Altamese CarolinaOBSON, [name] Weeks in Treatment: 2961 Active Problems Location of Pain Severity and  Description of Pain Patient Has Paino No Site Locations Pain Management and Medication Current Pain Management: Electronic Signature(s) Signed: 01/16/2019 11:35:01 AM By: Dayton MartesWallace, RCP,RRT,CHT, Sallie RCP, RRT, CHT Signed: 01/16/2019 5:28:11 PM By: Elliot GurneyWoody, BSN, RN, CWS, Kim RN, BSN Entered By: Dayton MartesWallace, RCP,RRT,CHT, Sallie on 01/16/2019 11:11:15 [name], [name] ([id number]) -------------------------------------------------------------------------------- Patient/Caregiver Education Details Patient Name: [name], [name] Date of Service: 01/16/2019 11:00 AM Medical Record Number: [id number] Patient Account Number: [id number] Date of Birth/Gender: 09/15/1930 (85 y.o. F) Treating RN: Huel CoventryWoody, Kim Primary Care Physician: Aram BeechamSparks, Jeffrey Other Clinician: Referring Physician: Aram BeechamSparks, Jeffrey Treating Physician/Extender: Altamese CarolinaOBSON, [name] Weeks in Treatment: 7561 Education Assessment Education Provided To: Patient Education Topics Provided Pressure: Handouts: Other: Keep pressure off of area Methods: Demonstration, Explain/Verbal Responses: State content correctly Wound/Skin Impairment: Handouts: Caring for Your Ulcer Methods: Demonstration, Explain/Verbal Responses: State content correctly Electronic Signature(s) Signed: 01/16/2019 5:28:11 PM By: Elliot GurneyWoody, BSN, RN, CWS, Kim RN, BSN Entered By: Elliot GurneyWoody, BSN, RN, CWS, Kim on 01/16/2019 12:02:49 [name], [name] ([id number]) -------------------------------------------------------------------------------- Wound Assessment Details Patient Name:  Solorzano, Renessa V. Date of Service: 01/16/2019 11:00 AM Medical Record Number: [id number] Patient Account Number: [id number] Date of Birth/Sex: 11/26/1930 (85 y.o. F) Treating RN: Curtis Sites Primary Care Bibi Economos: Aram Beecham Other Clinician: Referring Janus Vlcek: Aram Beecham Treating Laporscha Linehan/Extender: Altamese [place] in Treatment: 5 Wound Status Wound Number: 9 Primary Open Surgical  Wound Etiology: Wound Location: Left Elbow Wound Open Wounding Event: Surgical Injury Status: Date Acquired: 01/26/2018 Comorbid Cataracts, Glaucoma, Optic Neuritis, Chronic Weeks Of Treatment: 37 History: sinus problems/congestion, Middle ear problems Clustered Wound: No Photos Photo Uploaded By: Curtis Sites on 01/16/2019 11:47:18 Wound Measurements Length: (cm) 0.7 Width: (cm) 0.5 Depth: (cm) 0.2 Area: (cm) 0.275 Volume: (cm) 0.055 % Reduction in Area: -287.3% % Reduction in Volume: -292.9% Epithelialization: None Tunneling: No Undermining: Yes Starting Position (o'clock): 3 Ending Position (o'clock): 7 Maximum Distance: (cm) 0.6 Wound Description Full Thickness With Exposed Support Classification: Structures Wound Margin: Distinct, outline attached Exudate Medium Amount: Exudate Type: Purulent Exudate Color: yellow, brown, green Foul Odor After Cleansing: No Slough/Fibrino No Wound Bed Granulation Amount: Small (1-33%) Exposed Structure Granulation Quality: Pale Fascia Exposed: No Necrotic Amount: Medium (34-66%) Fat Layer (Subcutaneous Tissue) Exposed: Yes LANASHA, SYLVE ([id number]) Necrotic Quality: Adherent Slough Tendon Exposed: No Muscle Exposed: No Joint Exposed: No Bone Exposed: No Periwound Skin Texture Texture Color No Abnormalities Noted: No No Abnormalities Noted: No Callus: No Atrophie Blanche: No Crepitus: No Cyanosis: No Excoriation: No Ecchymosis: No Induration: No Erythema: Yes Rash: No Erythema Location: Circumferential Scarring: No Hemosiderin Staining: No Mottled: No Moisture Pallor: No No Abnormalities Noted: No Rubor: No Dry / Scaly: No Maceration: Yes Temperature / Pain Temperature: No Abnormality Tenderness on Palpation: Yes Wound Preparation Ulcer Cleansing: Rinsed/Irrigated with Saline Topical Anesthetic Applied: Other: lidocaine 4%, Electronic Signature(s) Signed: 01/16/2019 5:48:53 PM By: Curtis Sites Entered By: Curtis Sites on 01/16/2019 11:37:37 Natasha Basset ([id number]) -------------------------------------------------------------------------------- Vitals Details Patient Name: Natasha Basset Date of Service: 01/16/2019 11:00 AM Medical Record Number: [id number] Patient Account Number: [id number] Date of Birth/Sex: 08-10-1930 (85 y.o. F) Treating RN: Huel Coventry Primary Care Gilverto Dileonardo: Aram Beecham Other Clinician: Referring Tesa Meadors: Aram Beecham Treating Cameran Pettey/Extender: Altamese [place] in Treatment: 51 Vital Signs Time Taken: 11:11 Temperature (F): 98.0 Height (in): 60 Pulse (bpm): 66 Weight (lbs): 123 Respiratory Rate (breaths/min): 18 Body Mass Index (BMI): 24 Blood Pressure (mmHg): 123/51 Reference Range: 80 - 120 mg / dl Airway Electronic Signature(s) Signed: 01/16/2019 11:35:01 AM By: Dayton Martes RCP, RRT, CHT Entered By: Dayton Martes on 01/16/2019 11:14:42

## 2019-01-17 NOTE — Progress Notes (Signed)
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

## 2019-01-21 ENCOUNTER — Ambulatory Visit: Payer: Medicare Other

## 2019-01-21 DIAGNOSIS — R2681 Unsteadiness on feet: Secondary | ICD-10-CM | POA: Diagnosis not present

## 2019-01-21 DIAGNOSIS — M545 Low back pain: Secondary | ICD-10-CM

## 2019-01-21 DIAGNOSIS — Z9181 History of falling: Secondary | ICD-10-CM

## 2019-01-21 DIAGNOSIS — G8929 Other chronic pain: Secondary | ICD-10-CM

## 2019-01-21 DIAGNOSIS — M6281 Muscle weakness (generalized): Secondary | ICD-10-CM

## 2019-01-21 DIAGNOSIS — R262 Difficulty in walking, not elsewhere classified: Secondary | ICD-10-CM

## 2019-01-21 NOTE — Therapy (Signed)
[facility] [address], [place], [address] Phone: [phone]   Fax:  [phone]  Physical Therapy Treatment  Patient Details  Name: [name] MRN: [id number] Date of Birth: Feb 03, 1930 Referring Provider (PT): Jennings Books, MD   Encounter Date: 01/21/2019  PT End of Session - 01/21/19 1300    Visit Number  27    Number of Visits  45    Date for PT Re-Evaluation  01/24/19    Authorization Type  8    Authorization Time Period  of 10 progress report    PT Start Time  1300    PT Stop Time  1350    PT Time Calculation (min)  50 min    Equipment Utilized During Treatment  Gait belt    Activity Tolerance  Patient tolerated treatment well    Behavior During Therapy  WFL for tasks assessed/performed       Past Medical History:  Diagnosis Date  . Anemia   . GERD (gastroesophageal reflux disease)   . Hypertension   . Peripheral vascular disease (HCC)    possible neuropathies in lower extremeties  . PONV (postoperative nausea and vomiting)    happens sometimes but better with pre med of zofran  . Presence of permanent cardiac pacemaker   . Syncope     Past Surgical History:  Procedure Laterality Date  . ABDOMINAL HYSTERECTOMY  1969  . APPENDECTOMY  1969   with hysterectomy  . BACK SURGERY  2001   rods in back. surgery on back x 3  . COLONOSCOPY    . I&D EXTREMITY Left 03/27/2018   Procedure: IRRIGATION AND DEBRIDEMENT LEFT ELBOW / OLECRANON BURSA;  Surgeon: Corky Mull, MD;  Location: [facility];  Service: Orthopedics;  Laterality: Left;  . I&D EXTREMITY Left 06/05/2018   Procedure: IRRIGATION AND DEBRIDEMENT EXTREMITY;  Surgeon: Corky Mull, MD;  Location: [facility];  Service: Orthopedics;  Laterality: Left;  . INSERT / REPLACE / REMOVE PACEMAKER  2004  . JOINT REPLACEMENT Left 2004   partial knee replacement  . OLECRANON BURSECTOMY Left 03/27/2018   Procedure: LEFT OLECRANON BURSA;  Surgeon:  Corky Mull, MD;  Location: [facility];  Service: Orthopedics;  Laterality: Left;  . PACEMAKER INSERTION  2014   dual chamber for complete heart block    There were no vitals filed for this visit.  Subjective Assessment - 01/21/19 1303    Subjective  Back is ok, no pain. Pain is in [name] mostly.     Pertinent History  Imparied gait. Pt states that she has had balance issues for too long. Feels more comfortable walking with her rw. Had tests which showed that she has neuropathy. However the doctor at [facility] said that her balance is coming from her back. Just wants to be strong so she can walk.  Started walking with her rw off and on for the past several months.  Also feels like she might need a new sleep number bed. Also had 2 wound surgeries at [name]. Pt fell in February 2019. Had to stop PT due to the L elbow surgery April 06, 2018 to remove a bursa. The wound still has not healed.   Currently sees a wound doctor.   Feels numbness L foot from the ankle down.  No R foot numbness.  Sometimes unsure of where her L LE is in space.  Pt also states that she tires  too quickly.  Has not been able to walk her dog in 2 years. Has not really been doing exercises at home except the sit <> stand. Pt states that prolonged sitting without back support will increase back pain     Limitations  Standing;Walking    How long can you sit comfortably?  1 hr    How long can you stand comfortably?  10 minutes    How long can you walk comfortably?  2 or 3 blocks    Diagnostic tests  EMG testing BLEs and BUEs which were negative    Patient Stated Goals  Improve LE strength. Wants to use the machines at the gym. Walk more steadily. Be able to take her dog (16 lbs) out on a leash.     Pain Onset  More than a month ago         [facility] PT Assessment - 01/21/19 1333      Observation/Other Assessments   Observations  10 MWT with SPC 0.60 m/s avg;      Dynamic Gait Index   Level Surface  Mild Impairment     Change in Gait Speed  Mild Impairment    Gait with Horizontal Head Turns  Moderate Impairment    Gait with Vertical Head Turns  Moderate Impairment    Gait and Pivot Turn  Mild Impairment    Step Over Obstacle  Moderate Impairment    Step Around Obstacles  Normal    Steps  Mild Impairment    Total Score  14    DGI comment:  with SPC. < 19/24 suggests increased risk for falls                           PT Education - 01/21/19 1338    Education provided  Yes    Education Details  ther-ex, progress/current status    Person(s) Educated  Patient    Methods  Explanation;Demonstration;Tactile cues;Verbal cues    Comprehension  Returned demonstration;Verbalized understanding      Objectives  Pt ambulating with rw  TTP bilateral greater trochanter L >R   L elbow wound  No latex band allergies    MedbridgeAccess Code: [email or web address]   Gait training   Gait with SPC 100 ft. SBA  Gait with SPC 10 meters  17.27 seconds, 16.30 seconds (16.79 seconds average time; 0.60 m/s avg)  Directed patient with gait with normal gait speed, with changes in speed, 180 degree pivot turn, with R and L cervical rotation position, with cervical flexion and extension position, stepping around obstacles, stepping over an obstacle, ascending and descending 4 regular steps with UE assist    Reviewed progress/current status with DGI with pt.    Improved technique, movement at target joints, use of target muscles after min to mod verbal, visual, tactile cues.  Improving ability to ambulate with SPC.     Therapeutic exercise   Seated LAQ 3 lbs R 10x3 L 10x3  Standing static mini squat 10x5 seconds with light touch to no UE assist. No LOB with no UE assist  Leg press seat 2,  B LE plate 45 for 61P5 Cues for femoral control Performed to promote LE strengthening (glute and quad) as well as for  weight bearing exercise to help promote bone density.  Improved exercise technique, movement at target joints, use of target muscles after min to mod verbal, visual, tactile cues.    Pt tolerated session well without aggravation of  symptoms. good muscle use felt with exercises by pt.    Pt improving overall with gait, being able to perform gait acitvities consistently with SPC and ambulating with SPC towards areas where she does her exercises with SPC with CGA to SBA. Able to perform DGI and 10 MWT today with SPC instead of the walker. Pt demonstrates difficulty ambulating at a faster pace overall however. Continued working on LE strengthening as well to continue to promote ability to ambulate and perform standing tasks with less difficulty. Pt will benefit from continued skilled physical therapy services to improve strength, function, and ability to ambulate.       PT Short Term Goals - 12/13/18 1651      PT SHORT TERM GOAL #1   Title  Patient will be independent with her HEP to improve strength and balance.     Time  3    Period  Weeks    Status  On-going    Target Date  01/03/19        PT Long Term Goals - 01/08/19 1539      PT LONG TERM GOAL #1   Title  Patient will improve her 10 MWT to 0.8 m/s or more with rw to promote better community ambulation.     Baseline  0.5 seconds with rw (09/06/2018); 0.63 seconds average with rw (10/18/2018); 0.69 m/s average using rw (12/10/2018); 0.59 m/s using SPC and CGA (01/08/2019)    Time  6    Period  Weeks    Status  On-going    Target Date  01/24/19      PT LONG TERM GOAL #2   Title  Patient will improve her DGI score using rw or least restrictive AD to 19/24 or more to promote balance.     Baseline  13/24 using rw (09/06/2018); 14/24 using rw (10/29/2018); 16/24 (12/10/2018)    Time  6    Period  Weeks    Status  On-going    Target Date  01/24/19      PT LONG TERM GOAL #3   Title  Patient will improve bilateral LE strength by  at least 1/2 MMT to promote ability to support herself when performing standing tasks and improve balance.     Time  6    Period  Weeks    Status  Partially Met    Target Date  01/24/19      PT LONG TERM GOAL #4   Title  Patient will have a decrease in back pain/ache to 5/10 or less to promote ability to perform standing tasks with less difficulty.    Baseline  8/10 at most for the past 3 months (09/06/2018); 5-6/10 at worst for the past 7 days (10/18/2018); 5/10 at worst for the past 7 days (12/10/2018)    Time  6    Period  Weeks    Status  Achieved            Plan - 01/21/19 1255    Clinical Impression Statement  Pt improving overall with gait, being able to perform gait acitvities consistently with SPC and ambulating with SPC towards areas where she does her exercises with SPC with CGA to SBA. Able to perform DGI and 10 MWT today with SPC instead of the walker. Pt demonstrates difficulty ambulating at a faster pace overall however. Continued working on LE strengthening as well to continue to promote ability to ambulate and perform standing tasks with less difficulty. Pt will benefit from continued  skilled physical therapy services to improve strength, function, and ability to ambulate.     Rehab Potential  Fair    Clinical Impairments Affecting Rehab Potential  (-) chronicity of condition, age, comorbidities; (+) motivated    PT Frequency  2x / week    PT Duration  6 weeks    PT Treatment/Interventions  Aquatic Therapy;Gait training;Therapeutic activities;Therapeutic exercise;Balance training;Neuromuscular re-education;Patient/family education;Manual techniques;Dry needling    PT Next Visit Plan  trunk, hip, scapular strengthening, balance, gait, manual techniques PRN    Consulted and Agree with Plan of Care  Patient       Patient will benefit from skilled therapeutic intervention in order to improve the following deficits and impairments:  Abnormal gait, Decreased activity  tolerance, Decreased balance, Decreased endurance, Decreased range of motion, Decreased strength, Difficulty walking, Improper body mechanics, Postural dysfunction, Pain, Decreased knowledge of use of DME  Visit Diagnosis: Unsteadiness on feet  Difficulty in walking, not elsewhere classified  Muscle weakness (generalized)  History of falling  Chronic low back pain, unspecified back pain laterality, unspecified whether sciatica present     Problem List Patient Active Problem List   Diagnosis Date Noted  . Septic olecranon bursitis of left elbow 03/27/2018  . Lymphedema 10/11/2017  . Chronic venous insufficiency 10/11/2017  . Leg pain 10/11/2017  . Leg swelling 10/11/2017  . COPD (chronic obstructive pulmonary disease) ([place]) 10/11/2017  . Essential hypertension 10/11/2017    Joneen Boers PT, DPT   01/21/2019, 4:31 PM  [facility] [address] [place], [address] Phone: [phone]   Fax:  [phone]  Name: [name] MRN: [id number] Date of Birth: 10-25-1930

## 2019-01-23 ENCOUNTER — Encounter: Payer: Medicare Other | Admitting: Internal Medicine

## 2019-01-23 ENCOUNTER — Ambulatory Visit: Payer: Medicare Other

## 2019-01-23 DIAGNOSIS — L98495 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: Secondary | ICD-10-CM | POA: Diagnosis not present

## 2019-01-24 ENCOUNTER — Ambulatory Visit: Payer: Medicare Other

## 2019-01-25 NOTE — Progress Notes (Signed)
TAKYLA, BOIK ([id number]) Visit Report for 01/23/2019 Arrival Information Details Patient Name: [name], [name]. Date of Service: 01/23/2019 1:45 PM Medical Record Number: [id number] Patient Account Number: [id number] Date of Birth/Sex: 18-Jun-1930 (85 y.o. F) Treating RN: Rema [name] Primary Care Jermanie Minshall: Aram Beecham Other Clinician: Referring Anokhi Shannon: Aram Beecham Treating Zacharias Ridling/Extender: Altamese [place] in Treatment: 55 Visit Information History Since Last Visit Added or deleted any medications: No Patient Arrived: Walker Any new allergies or adverse reactions: No Arrival Time: 13:54 Had a fall or experienced change in No Accompanied By: self activities of daily living that may affect Transfer Assistance: None risk of falls: Patient Identification Verified: Yes Signs or symptoms of abuse/neglect since last visito No Secondary Verification Process Yes Hospitalized since last visit: No Completed: Implantable device outside of the clinic excluding No Patient Requires Transmission-Based No cellular tissue based products placed in the center Precautions: since last visit: Patient Has Alerts: Yes Has Dressing in Place as Prescribed: Yes Patient Alerts: non compressible left Pain Present Now: No leg Electronic Signature(s) Signed: 01/23/2019 5:11:19 PM By: Rema [name] Entered By: Rema [name] on 01/23/2019 13:59:13 [name] ([id number]) -------------------------------------------------------------------------------- Encounter Discharge Information Details Patient Name: [name] Date of Service: 01/23/2019 1:45 PM Medical Record Number: [id number] Patient Account Number: [id number] Date of Birth/Sex: 23-Jan-1930 (85 y.o. F) Treating RN: Huel Coventry Primary Care Amarys Sliwinski: Aram Beecham Other Clinician: Referring Danne Scardina: Aram Beecham Treating Morio Widen/Extender: Altamese [place] in Treatment: 81 Encounter Discharge Information Items Post  Procedure Vitals Discharge Condition: Stable Unable to obtain vitals Reason: Patient wanted to leave. Ambulatory Status: Ambulatory Discharge Destination: Home Transportation: Private Auto Accompanied By: self Schedule Follow-up Appointment: Yes Clinical Summary of Care: Electronic Signature(s) Signed: 01/23/2019 6:12:54 PM By: Elliot Gurney, BSN, RN, CWS, Kim RN, BSN Entered By: Elliot Gurney, BSN, RN, CWS, Kim on 01/23/2019 18:12:54 [name] ([id number]) -------------------------------------------------------------------------------- Lower Extremity Assessment Details Patient Name: [name], [name] Date of Service: 01/23/2019 1:45 PM Medical Record Number: [id number] Patient Account Number: [id number] Date of Birth/Sex: 1930-04-28 (85 y.o. F) Treating RN: Rema [name] Primary Care Dayquan Buys: Aram Beecham Other Clinician: Referring Wyland Rastetter: Aram Beecham Treating Emani Taussig/Extender: Altamese [place] in Treatment: 75 Electronic Signature(s) Signed: 01/23/2019 5:11:19 PM By: Rema [name] Entered By: Rema [name] on 01/23/2019 14:10:59 [name] ([id number]) -------------------------------------------------------------------------------- Multi Wound Chart Details Patient Name: [name] Date of Service: 01/23/2019 1:45 PM Medical Record Number: [id number] Patient Account Number: [id number] Date of Birth/Sex: Aug 01, 1930 (85 y.o. F) Treating RN: Huel Coventry Primary Care Jaelle Campanile: Aram Beecham Other Clinician: Referring Corene Resnick: Aram Beecham Treating Lakashia Collison/Extender: Altamese [place] in Treatment: 87 Vital Signs Height(in): 60 Pulse(bpm): 72 Weight(lbs): 123 Blood Pressure(mmHg): 124/46 Body Mass Index(BMI): 24 Temperature(F): 98.2 Respiratory Rate 16 (breaths/min): Photos: [N/A:N/A] Wound Location: Left Elbow N/A N/A Wounding Event: Surgical Injury N/A N/A Primary Etiology: Open Surgical Wound N/A N/A Comorbid History: Cataracts, Glaucoma, Optic N/A  N/A Neuritis, Chronic sinus problems/congestion, Middle ear problems Date Acquired: 01/26/2018 N/A N/A Weeks of Treatment: 38 N/A N/A Wound Status: Open N/A N/A Measurements L x W x D 0.9x0.6x0.4 N/A N/A (cm) Area (cm) : 0.424 N/A N/A Volume (cm) : 0.17 N/A N/A % Reduction in Area: -497.20% N/A N/A % Reduction in Volume: -1114.30% N/A N/A Starting Position 1 4 (o'clock): Ending Position 1 11 (o'clock): Maximum Distance 1 (cm): 0.5 Undermining: Yes N/A N/A Classification: Full Thickness With Exposed N/A N/A Support Structures Exudate Amount: Large N/A N/A Exudate Type: Purulent N/A N/A Exudate Color:  yellow, brown, green N/A N/A Wound Margin: Distinct, outline attached N/A N/A Granulation Amount: None Present (0%) N/A N/A [name], [name] ([id number]) Necrotic Amount: Large (67-100%) N/A N/A Exposed Structures: Fat Layer (Subcutaneous N/A N/A Tissue) Exposed: Yes Fascia: No Tendon: No Muscle: No Joint: No Bone: No Epithelialization: None N/A N/A Debridement: Debridement - Excisional N/A N/A Pre-procedure 14:32 N/A N/A Verification/Time Out Taken: Pain Control: Lidocaine N/A N/A Tissue Debrided: Subcutaneous, Slough N/A N/A Level: Skin/Subcutaneous Tissue N/A N/A Debridement Area (sq cm): 0.54 N/A N/A Instrument: Curette N/A N/A Bleeding: Minimum N/A N/A Hemostasis Achieved: Pressure N/A N/A Debridement Treatment Procedure was tolerated well N/A N/A Response: Post Debridement 0.9x0.6x0.4 N/A N/A Measurements L x W x D (cm) Post Debridement Volume: 0.17 N/A N/A (cm) Periwound Skin Texture: Excoriation: No N/A N/A Induration: No Callus: No Crepitus: No Rash: No Scarring: No Periwound Skin Moisture: Maceration: Yes N/A N/A Dry/Scaly: No Periwound Skin Color: Erythema: Yes N/A N/A Atrophie Blanche: No Cyanosis: No Ecchymosis: No Hemosiderin Staining: No Mottled: No Pallor: No Rubor: No Erythema Location: Circumferential N/A N/A Temperature: No  Abnormality N/A N/A Tenderness on Palpation: Yes N/A N/A Wound Preparation: Ulcer Cleansing: N/A N/A Rinsed/Irrigated with Saline Topical Anesthetic Applied: Other: lidocaine 4% Procedures Performed: Cellular or Tissue Based N/A N/A Product Debridement Treatment Notes Electronic Signature(s) [name], Atley V. ([id number]) Signed: 01/23/2019 6:19:03 PM By: Baltazar Najjarobson, Michael MD Entered By: Baltazar Najjarobson, Michael on 01/23/2019 16:42:36 [name], [name] ([id number]) -------------------------------------------------------------------------------- Multi-Disciplinary Care Plan Details Patient Name: [name], [name] Date of Service: 01/23/2019 1:45 PM Medical Record Number: [id number] Patient Account Number: [id number] Date of Birth/Sex: 07/31/1930 (85 y.o. F) Treating RN: Huel CoventryWoody, Kim Primary Care Bayden Gil: Aram BeechamSparks, Jeffrey Other Clinician: Referring Dawud Mays: Aram BeechamSparks, Jeffrey Treating Caylan Schifano/Extender: Altamese CarolinaOBSON, [name] Weeks in Treatment: 6062 Active Inactive Soft Tissue Infection Nursing Diagnoses: Impaired tissue integrity Goals: Patient's soft tissue infection will resolve Date Initiated: 07/18/2018 Target Resolution Date: 07/27/2018 Goal Status: Active Interventions: Assess signs and symptoms of infection every visit Treatment Activities: Culture and sensitivity : 07/18/2018 Systemic antibiotics : 07/18/2018 Notes: Wound/Skin Impairment Nursing Diagnoses: Impaired tissue integrity Knowledge deficit related to ulceration/compromised skin integrity Goals: Patient/caregiver will verbalize understanding of skin care regimen Date Initiated: 11/14/2017 Target Resolution Date: 11/28/2017 Goal Status: Active Ulcer/skin breakdown will have a volume reduction of 30% by week 4 Date Initiated: 11/14/2017 Target Resolution Date: 11/28/2017 Goal Status: Active Interventions: Assess patient/caregiver ability to obtain necessary supplies Assess patient/caregiver ability to perform ulcer/skin care regimen  upon admission and as needed Assess ulceration(s) every visit Treatment Activities: Skin care regimen initiated : 11/14/2017 [name], [name] ([id number]) Notes: Electronic Signature(s) Signed: 01/24/2019 8:43:41 AM By: Elliot GurneyWoody, BSN, RN, CWS, Kim RN, BSN Entered By: Elliot GurneyWoody, BSN, RN, CWS, Kim on 01/23/2019 14:32:37 [name], [name] ([id number]) -------------------------------------------------------------------------------- Pain Assessment Details Patient Name: [name], [name] Date of Service: 01/23/2019 1:45 PM Medical Record Number: [id number] Patient Account Number: [id number] Date of Birth/Sex: 09/05/1930 (85 y.o. F) Treating RN: Rema JasmineNg, Wendi Primary Care Ziyana Morikawa: Aram BeechamSparks, Jeffrey Other Clinician: Referring Angelia Hazell: Aram BeechamSparks, Jeffrey Treating Brentley Horrell/Extender: Altamese CarolinaOBSON, [name] Weeks in Treatment: 3762 Active Problems Location of Pain Severity and Description of Pain Patient Has Paino No Site Locations Pain Management and Medication Current Pain Management: Notes pt denies any pain at this time. Electronic Signature(s) Signed: 01/23/2019 5:11:19 PM By: Rema JasmineNg, Wendi Entered By: Rema JasmineNg, Wendi on 01/23/2019 14:00:32 [name], Livana V. ([id number]) -------------------------------------------------------------------------------- Patient/Caregiver Education Details Patient Name: [name], Mikia V. Date of Service: 01/23/2019 1:45 PM Medical Record Number: [id number] Patient Account Number:  [id number] Date of Birth/Gender: Feb 26, 1930 (85 y.o. F) Treating RN: Huel Coventry Primary Care Physician: Aram Beecham Other Clinician: Referring Physician: Aram Beecham Treating Physician/Extender: Altamese [place] in Treatment: 75 Education Assessment Education Provided To: Patient Education Topics Provided Wound/Skin Impairment: Handouts: Caring for Your Ulcer Methods: Demonstration, Explain/Verbal Responses: State content correctly Electronic Signature(s) Signed: 01/24/2019 8:43:41 AM By: Elliot Gurney,  BSN, RN, CWS, Kim RN, BSN Entered By: Elliot Gurney, BSN, RN, CWS, Kim on 01/23/2019 18:07:55 [name] ([id number]) -------------------------------------------------------------------------------- Wound Assessment Details Patient Name: [name] Date of Service: 01/23/2019 1:45 PM Medical Record Number: [id number] Patient Account Number: [id number] Date of Birth/Sex: September 30, 1930 (85 y.o. F) Treating RN: Rema [name] Primary Care Kal Chait: Aram Beecham Other Clinician: Referring Josilynn Losh: Aram Beecham Treating Kenyan Karnes/Extender: Altamese [place] in Treatment: 62 Wound Status Wound Number: 9 Primary Open Surgical Wound Etiology: Wound Location: Left Elbow Wound Open Wounding Event: Surgical Injury Status: Date Acquired: 01/26/2018 Comorbid Cataracts, Glaucoma, Optic Neuritis, Chronic Weeks Of Treatment: 38 History: sinus problems/congestion, Middle ear problems Clustered Wound: No Photos Photo Uploaded By: Rema [name] on 01/23/2019 14:55:12 Wound Measurements Length: (cm) 0.9 Width: (cm) 0.6 Depth: (cm) 0.4 Area: (cm) 0.424 Volume: (cm) 0.17 % Reduction in Area: -497.2% % Reduction in Volume: -1114.3% Epithelialization: None Tunneling: No Undermining: Yes Starting Position (o'clock): 4 Ending Position (o'clock): 11 Maximum Distance: (cm) 0.5 Wound Description Full Thickness With Exposed Support Foul Odo Classification: Structures Slough/F Wound Margin: Distinct, outline attached Exudate Large Amount: Exudate Type: Purulent Exudate Color: yellow, brown, green r After Cleansing: No ibrino No Wound Bed Granulation Amount: None Present (0%) Exposed Structure Necrotic Amount: Large (67-100%) Fascia Exposed: No Necrotic Quality: Adherent Slough Fat Layer (Subcutaneous Tissue) Exposed: Yes GAYLIN, HOSBACH ([id number]) Tendon Exposed: No Muscle Exposed: No Joint Exposed: No Bone Exposed: No Periwound Skin Texture Texture Color No Abnormalities  Noted: No No Abnormalities Noted: No Callus: No Atrophie Blanche: No Crepitus: No Cyanosis: No Excoriation: No Ecchymosis: No Induration: No Erythema: Yes Rash: No Erythema Location: Circumferential Scarring: No Hemosiderin Staining: No Mottled: No Moisture Pallor: No No Abnormalities Noted: No Rubor: No Dry / Scaly: No Maceration: Yes Temperature / Pain Temperature: No Abnormality Tenderness on Palpation: Yes Wound Preparation Ulcer Cleansing: Rinsed/Irrigated with Saline Topical Anesthetic Applied: Other: lidocaine 4%, Treatment Notes Wound #9 (Left Elbow) Notes PuraPly, steri strips, mepitel, gauze bolster and border foam dressing Electronic Signature(s) Signed: 01/23/2019 5:11:19 PM By: Rema [name] Entered By: Rema [name] on 01/23/2019 14:10:50 [name] ([id number]) -------------------------------------------------------------------------------- Vitals Details Patient Name: [name] Date of Service: 01/23/2019 1:45 PM Medical Record Number: [id number] Patient Account Number: [id number] Date of Birth/Sex: 06/30/1930 (85 y.o. F) Treating RN: Rema [name] Primary Care Wylie Russon: Aram Beecham Other Clinician: Referring Annina Piotrowski: Aram Beecham Treating Kataleya Zaugg/Extender: Altamese [place] in Treatment: 18 Vital Signs Time Taken: 14:00 Temperature (F): 98.2 Height (in): 60 Pulse (bpm): 72 Weight (lbs): 123 Respiratory Rate (breaths/min): 16 Body Mass Index (BMI): 24 Blood Pressure (mmHg): 124/46 Reference Range: 80 - 120 mg / dl Airway Electronic Signature(s) Signed: 01/23/2019 5:11:19 PM By: Rema [name] Entered By: Rema [name] on 01/23/2019 14:01:12
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
## 2019-01-28 ENCOUNTER — Ambulatory Visit: Payer: Medicare Other

## 2019-01-28 DIAGNOSIS — M6281 Muscle weakness (generalized): Secondary | ICD-10-CM

## 2019-01-28 DIAGNOSIS — G8929 Other chronic pain: Secondary | ICD-10-CM

## 2019-01-28 DIAGNOSIS — R2681 Unsteadiness on feet: Secondary | ICD-10-CM

## 2019-01-28 DIAGNOSIS — M545 Low back pain: Secondary | ICD-10-CM

## 2019-01-28 DIAGNOSIS — Z9181 History of falling: Secondary | ICD-10-CM

## 2019-01-28 DIAGNOSIS — R262 Difficulty in walking, not elsewhere classified: Secondary | ICD-10-CM

## 2019-01-28 NOTE — Therapy (Signed)
[facility] PHYSICAL AND SPORTS MEDICINE [address] [place], [place], [address] Phone: [phone]   Fax:  [phone]  Physical Therapy Treatment  Patient Details  Name: Natasha BassetJanice V Barnett MRN: [id number] Date of Birth: 07/04/1930 Referring Provider (PT): Cristopher PeruHemang Shah, MD   Encounter Date: 01/28/2019  PT End of Session - 01/28/19 1304    Visit Number  28    Number of Visits  62    Date for PT Re-Evaluation  03/28/19    Authorization Type  9    Authorization Time Period  of 10 progress report    PT Start Time  1304    PT Stop Time  1351    PT Time Calculation (min)  47 min    Equipment Utilized During Treatment  Gait belt    Activity Tolerance  Patient tolerated treatment well    Behavior During Therapy  WFL for tasks assessed/performed       Past Medical History:  Diagnosis Date  . Anemia   . GERD (gastroesophageal reflux disease)   . Hypertension   . Peripheral vascular disease (HCC)    possible neuropathies in lower extremeties  . PONV (postoperative nausea and vomiting)    happens sometimes but better with pre med of zofran  . Presence of permanent cardiac pacemaker   . Syncope     Past Surgical History:  Procedure Laterality Date  . ABDOMINAL HYSTERECTOMY  1969  . APPENDECTOMY  1969   with hysterectomy  . BACK SURGERY  2001   rods in back. surgery on back x 3  . COLONOSCOPY    . I&D EXTREMITY Left 03/27/2018   Procedure: IRRIGATION AND DEBRIDEMENT LEFT ELBOW / OLECRANON BURSA;  Surgeon: Christena FlakePoggi, John J, MD;  Location: [facility];  Service: Orthopedics;  Laterality: Left;  . I&D EXTREMITY Left 06/05/2018   Procedure: IRRIGATION AND DEBRIDEMENT EXTREMITY;  Surgeon: Christena FlakePoggi, John J, MD;  Location: [facility];  Service: Orthopedics;  Laterality: Left;  . INSERT / REPLACE / REMOVE PACEMAKER  2004  . JOINT REPLACEMENT Left 2004   partial knee replacement  . OLECRANON BURSECTOMY Left 03/27/2018   Procedure: LEFT OLECRANON BURSA;  Surgeon:  Christena FlakePoggi, John J, MD;  Location: [facility];  Service: Orthopedics;  Laterality: Left;  . PACEMAKER INSERTION  2014   dual chamber for complete heart block    There were no vitals filed for this visit.  Subjective Assessment - 01/28/19 1308    Subjective  Had to cancel last Thursday's appointment because when she woke up and got up onto her feet, she felt like she could not walk.  Had a lot of rain recently. Does not know if it is due to neuropathy or arthritis.  Pt states she sleeps on her back with a wedge under her knees.  Also feels pain B from the knees and up her thighs.  Its better than it was compared to Friday.  Unable to sleep on her sides due to bursitis.     Pertinent History  Imparied gait. Pt states that she has had balance issues for too long. Feels more comfortable walking with her rw. Had tests which showed that she has neuropathy. However [facility] that her balance is coming from her back. Just wants to be strong so she can walk.  Started walking with her rw off and on for the past several months.  Also feels like she might need a new sleep number bed. Also had  2 wound surgeries at [name]. Pt fell in February 2019. Had to stop PT due to the L elbow surgery April 06, 2018 to remove a bursa. The wound still has not healed.   Currently sees a wound doctor.   Feels numbness L foot from the ankle down.  No R foot numbness.  Sometimes unsure of where her L LE is in space.  Pt also states that she tires too quickly.  Has not been able to walk her dog in 2 years. Has not really been doing exercises at home except the sit <> stand. Pt states that prolonged sitting without back support will increase back pain     Limitations  Standing;Walking    How long can you sit comfortably?  1 hr    How long can you stand comfortably?  10 minutes    How long can you walk comfortably?  2 or 3 blocks    Diagnostic tests  EMG testing BLEs and BUEs which were negative    Patient Stated  Goals  Improve LE strength. Wants to use the machines at the gym. Walk more steadily. Be able to take her dog (16 lbs) out on a leash.     Currently in Pain?  No/denies    Pain Score  0-No pain    Pain Onset  More than a month ago         [facility] PT Assessment - 01/28/19 0001      Observation/Other Assessments   Observations  10 MWT with SPC 0.60 m/s avg;   measured on 01/21/2019     Strength   Right Hip Extension  4/5   seated manually resisted   Right Hip ABduction  4/5   clamshell isometric   Left Hip Extension  4+/5   seated manually resisted   Left Hip ABduction  4/5   clamshell isometric     Dynamic Gait Index   Level Surface  Mild Impairment    Change in Gait Speed  Mild Impairment    Gait with Horizontal Head Turns  Moderate Impairment    Gait with Vertical Head Turns  Moderate Impairment    Gait and Pivot Turn  Mild Impairment    Step Over Obstacle  Moderate Impairment    Step Around Obstacles  Normal    Steps  Mild Impairment    Total Score  14    DGI comment:  (measured on 01/21/2019) with SPC. < 19/24 suggests increased risk for falls                           PT Education - 01/28/19 1321    Education provided  Yes    Education Details  ther-ex    [employer]) Educated  Patient    Methods  Explanation;Demonstration;Tactile cues;Verbal cues    Comprehension  Returned demonstration;Verbalized understanding      Objectives  Pt ambulating with rw  TTP bilateral greater trochanter L >R   L elbow wound  No latex band allergies    MedbridgeAccess Code: [email or web address]   Gait training  Adjusted rw to proper height  Gait with SPC 100 ft. CGA. Pt does not seem as confident today. Stuttering steps at times and holding more to exercise equipment as well for support.   Then 100 ft with SPC CGA to Min A after seated trunk flexion exercise  Then 100 ft with SPC CGA after seated trunk extension isometrics in neutral. More steady  and  improved balance confidence.     Improved technique, movement at target joints, use of target muscles after min to mod verbal, visual, tactile cues.   Therapeutic exercise  Seated trunk flexion 10x5 seconds for 3 sets  Seated manually resisted trunk extension isometrics at neutral 10x5 seconds for 3 sets  Then another 10x5 seconds for 2 sets  Improved steadiness with gait with SPC  Leg press seat 2,  B LE plate 45 for [id number] Cues for femoral control Performed to promote LE strengthening (glute and quad) as well as for weight bearing exercise to help promote bone density.    More difficult today compared to previous session per pt .    Seated manually resited hip extension, clamshell isometrics   Improved exercise technique, movement at target joints, use of target muscles after mod verbal, visual, tactile cues.    Improved steadiness and confidence with gait with activation of trunk extensor muscles.    Pt was improving overall with gait with less restrictive AD (SPC), with pt able to perform 10 MWT and DGI with SPC instead of rw, as well as being able to ambulate at least 200 ft with SPC without LOB (with CGA).  Pt however had a recent set back last Thursday 01/24/19 in which pt reports having difficulty ambulating after getting out of bed in the morning. Symptoms are better now compared to last week per subjective reports. Decreased difficulty walking today with activation of trunk extensor muscles. No complain of back pain. Pt will benefit from continued skilled physical therapy services to improve LE strength, balance, ability to ambulate and perform standing tasks.                  PT Short Term Goals - 01/28/19 1929      PT SHORT TERM GOAL #1   Title  Patient will be independent with her HEP to improve strength and balance.     Time  3    Period  Weeks    Status  On-going    Target Date  02/21/19        PT  Long Term Goals - 01/28/19 1929      PT LONG TERM GOAL #1   Title  Patient will improve her 10 MWT to 0.8 m/s or more with rw to promote better community ambulation.     Baseline  0.5 seconds with rw (09/06/2018); 0.63 seconds average with rw (10/18/2018); 0.69 m/s average using rw (12/10/2018); 0.59 m/s using SPC and CGA (01/08/2019); 0.60 m/s using SPC (01/21/2019)    Time  8    Period  Weeks    Status  On-going    Target Date  03/28/19      PT LONG TERM GOAL #2   Title  Patient will improve her DGI score using rw or least restrictive AD to 19/24 or more to promote balance.     Baseline  13/24 using rw (09/06/2018); 14/24 using rw (10/29/2018); 16/24 (12/10/2018); 14/24 using SPC (01/21/2019)    Time  8    Period  Weeks    Status  On-going    Target Date  03/28/19      PT LONG TERM GOAL #3   Title  Patient will improve bilateral LE strength by at least 1/2 MMT to promote ability to support herself when performing standing tasks and improve balance.     Time  8    Period  Weeks    Status  On-going  Target Date  03/28/19      PT LONG TERM GOAL #4   Title  Patient will have a decrease in back pain/ache to 5/10 or less to promote ability to perform standing tasks with less difficulty.    Baseline  8/10 at most for the past 3 months (09/06/2018); 5-6/10 at worst for the past 7 days (10/18/2018); 5/10 at worst for the past 7 days (12/10/2018)    Time  6    Period  Weeks    Status  Achieved            Plan - 01/28/19 1321    Clinical Impression Statement  Pt was improving overall with gait with less restrictive AD (SPC), with pt able to perform 10 MWT and DGI with SPC instead of rw, as well as being able to ambulate at least 200 ft with SPC without LOB (with CGA).  Pt however had a recent set back last Thursday 01/24/19 in which pt reports having difficulty ambulating after getting out of bed in the morning. Symptoms are better now compared to last week per subjective reports. Decreased  difficulty walking today with activation of trunk extensor muscles. No complain of back pain. Pt will benefit from continued skilled physical therapy services to improve LE strength, balance, ability to ambulate and perform standing tasks.     History and Personal Factors relevant to plan of care:  Chronicity of condition, back pain, weakness, multiple comorbidities, age, fear of falling.     Clinical Presentation  Evolving    Clinical Presentation due to:  increased difficulty walking after getting on her feet last Thursday morning after getting up from bed.     Clinical Decision Making  Moderate    Rehab Potential  Fair    Clinical Impairments Affecting Rehab Potential  (-) chronicity of condition, age, comorbidities; (+) motivated    PT Frequency  2x / week    PT Duration  6 weeks    PT Treatment/Interventions  Aquatic Therapy;Gait training;Therapeutic activities;Therapeutic exercise;Balance training;Neuromuscular re-education;Patient/family education;Manual techniques;Dry needling    PT Next Visit Plan  trunk, hip, scapular strengthening, balance, gait, manual techniques PRN    Consulted and Agree with Plan of Care  Patient       Patient will benefit from skilled therapeutic intervention in order to improve the following deficits and impairments:  Abnormal gait, Decreased activity tolerance, Decreased balance, Decreased endurance, Decreased range of motion, Decreased strength, Difficulty walking, Improper body mechanics, Postural dysfunction, Pain, Decreased knowledge of use of DME  Visit Diagnosis: Unsteadiness on feet - Plan: PT plan of care cert/re-cert  Difficulty in walking, not elsewhere classified - Plan: PT plan of care cert/re-cert  Muscle weakness (generalized) - Plan: PT plan of care cert/re-cert  History of falling - Plan: PT plan of care cert/re-cert  Chronic low back pain, unspecified back pain laterality, unspecified whether sciatica present - Plan: PT plan of care  cert/re-cert     Problem List Patient Active Problem List   Diagnosis Date Noted  . Septic olecranon bursitis of left elbow 03/27/2018  . Lymphedema 10/11/2017  . Chronic venous insufficiency 10/11/2017  . Leg pain 10/11/2017  . Leg swelling 10/11/2017  . COPD (chronic obstructive pulmonary disease) (HCC) 10/11/2017  . Essential hypertension 10/11/2017    Loralyn Freshwater PT, DPT   01/28/2019, 7:43 PM  [facility] PHYSICAL AND SPORTS MEDICINE [address] [place], [address] Phone: [phone]   Fax:  [phone]  Name:  [name] MRN: [id number] Date of Birth: July 12, 1930

## 2019-01-30 ENCOUNTER — Ambulatory Visit: Payer: Medicare Other

## 2019-01-30 ENCOUNTER — Encounter: Payer: Medicare Other | Admitting: Internal Medicine

## 2019-01-30 DIAGNOSIS — R2681 Unsteadiness on feet: Secondary | ICD-10-CM | POA: Diagnosis not present

## 2019-01-30 DIAGNOSIS — M545 Low back pain, unspecified: Secondary | ICD-10-CM

## 2019-01-30 DIAGNOSIS — Z9181 History of falling: Secondary | ICD-10-CM

## 2019-01-30 DIAGNOSIS — G8929 Other chronic pain: Secondary | ICD-10-CM

## 2019-01-30 DIAGNOSIS — R262 Difficulty in walking, not elsewhere classified: Secondary | ICD-10-CM

## 2019-01-30 DIAGNOSIS — M6281 Muscle weakness (generalized): Secondary | ICD-10-CM

## 2019-01-30 DIAGNOSIS — L98495 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: Secondary | ICD-10-CM | POA: Diagnosis not present

## 2019-01-30 NOTE — Patient Instructions (Signed)
Reviewed and gave seated trunk extension on chair with pillow (back of chair against wall for safety) 10x5 seconds for 3 sets daily as part of her HEP. Pt demonstrated and verbalized understanding.

## 2019-01-30 NOTE — Therapy (Signed)
[place] [facility] PHYS[facility] AND SPORTS MEDICINE [address], [place], [address] Phone: [phone]   Fax:  [phone]  Physical Therapy Treatment And Progress Report (12/17/2018 - 01/30/2019)  Patient Details  Name: [name] MRN: [id number] Date of Birth: [birth year] Referring Provider (PT): Cristopher Peru, MD   Encounter Date: 01/30/2019  PT End of Session - 01/30/19 1036    Visit Number  29    Number of Visits  62    Date for PT Re-Evaluation  03/28/19    Authorization Type  10    Authorization Time Period  of 10 progress report    PT Start Time  1036    PT Stop Time  1120    PT Time Calculation (min)  44 min    Equipment Utilized During Treatment  Gait belt    Activity Tolerance  Patient tolerated treatment well    Behavior During Therapy  WFL for tasks assessed/performed       Past Medical History:  Diagnosis Date  . Anemia   . GERD (gastroesophageal reflux disease)   . Hypertension   . Peripheral vascular disease (HCC)    possible neuropathies in lower extremeties  . PONV (postoperative nausea and vomiting)    happens sometimes but better with pre med of zofran  . Presence of permanent cardiac pacemaker   . Syncope     Past Surgical History:  Procedure Laterality Date  . ABDOMINAL HYSTERECTOMY  1969  . APPENDECTOMY  1969   with hysterectomy  . BACK SURGERY  2001   rods in back. surgery on back x 3  . COLONOSCOPY    . I&D EXTREMITY Left 03/27/2018   Procedure: IRRIGATION AND DEBRIDEMENT LEFT ELBOW / OLECRANON BURSA;  Surgeon: Christena Flake, MD;  Location: [facility];  Service: Orthopedics;  Laterality: Left;  . I&D EXTREMITY Left 06/05/2018   Procedure: IRRIGATION AND DEBRIDEMENT EXTREMITY;  Surgeon: Christena Flake, MD;  Location: [facility];  Service: Orthopedics;  Laterality: Left;  . INSERT / REPLACE / REMOVE PACEMAKER  2004  . JOINT REPLACEMENT Left 2004   partial knee replacement  . OLECRANON BURSECTOMY Left 03/27/2018    Procedure: LEFT OLECRANON BURSA;  Surgeon: Christena Flake, MD;  Location: [facility];  Service: Orthopedics;  Laterality: Left;  . PACEMAKER INSERTION  2014   dual chamber for complete heart block    There were no vitals filed for this visit.  Subjective Assessment - 01/30/19 1038    Subjective  Walking is not where she will like it to be. Legs still do not feel as strong. Later in the day is usually better. Not as bad as it was the other day. Has also been off the arthritis medicine for about a month because it affects wound healing.   Feels like not taking the arthritis medicine might play a factor with walking.  Also states that taking care of husband affects her ability to take care of herself.     Pertinent History  Imparied gait. Pt states that she has had balance issues for too long. Feels more comfortable walking with her rw. Had tests which showed that she has neuropathy. However the doctor at [facility] said that her balance is coming from her back. Just wants to be strong so she can walk.  Started walking with her rw off and on for the past several months.  Also feels like she might need a new sleep number bed. Also had 2  wound surgeries at [name]. Pt fell in February 2019. Had to stop PT due to the L elbow surgery April 06, 2018 to remove a bursa. The wound still has not healed.   Currently sees a wound doctor.   Feels numbness L foot from the ankle down.  No R foot numbness.  Sometimes unsure of where her L LE is in space.  Pt also states that she tires too quickly.  Has not been able to walk her dog in 2 years. Has not really been doing exercises at home except the sit <> stand. Pt states that prolonged sitting without back support will increase back pain     Limitations  Standing;Walking    How long can you sit comfortably?  1 hr    How long can you stand comfortably?  10 minutes    How long can you walk comfortably?  2 or 3 blocks    Diagnostic tests  EMG testing BLEs and BUEs  which were negative    Patient Stated Goals  Improve LE strength. Wants to use the machines at the gym. Walk more steadily. Be able to take her dog (16 lbs) out on a leash.     Currently in Pain?  Other (Comment)    Pain Score  --   no complain of pain.    Pain Onset  More than a month ago                               PT Education - 01/30/19 1130    Education provided  Yes    Education Details  ther-ex, gait    Person(s) Educated  Patient    Methods  Explanation;Demonstration;Tactile cues;Verbal cues    Comprehension  Returned demonstration;Verbalized understanding        Objectives  Pt ambulating with rw  TTP bilateral greater trochanter L >R   L elbow wound  No latex band allergies    MedbridgeAccess Code: [email or web address]  Gait   Gait with hand held assist 200 ft, CGA to min A. LOB x 1 secondary to pt not picking her feet up as much with mod A to recover.     Then another 200 ft with SPC CGA. No LOB  Stepping over 6 mini hurdles with SPC and CGA. Unable to perform secondary to fear.   Then with hand held assist. Able to perform 2x. More confident with PT assist.   Improved technique, movement at target joints, use of target muscles after mod verbal, visual, tactile cues.    Therapeutic exercise   Seated resisted trunk extension isometrics at neutral 10x5 seconds for 3 sets             Improved steadiness with gait with SPC and hand held assist  Reviewed and given as part of her HEP. Pt demonstrated and verbalized understanding. Handout provided.   Leg press seat 2,  B LE plate 45 for [id number] Cues for femoral control Performed to promote LE strengthening (glute and quad) as well as for weight bearing exercise to help promote bone density.              Side stepping 32 ft each direction for 2 sets with CGA and hand held assist to promote glute med muscle strength and balance.    Improved  exercise technique, movement at target joints, use of target muscles after mod verbal, visual, tactile cues.    Pt  demonstrates improved ability to ambulate and more confidence doing so today compared to last session. Also demonstrates improved ability to ambulate after working on trunk extensor muscle strength. Pt also demonstrates overall improving ability to ambulate with SPC with pt able to perform her 10 MWT and DGI with SPC instead of her rw. Pt will benefit from continued skilled physical therapy services to improve LE strength, balance, ability to ambulate and perform standing tasks.             PT Short Term Goals - 01/28/19 1929      PT SHORT TERM GOAL #1   Title  Patient will be independent with her HEP to improve strength and balance.     Time  3    Period  Weeks    Status  On-going    Target Date  02/21/19        PT Long Term Goals - 01/28/19 1929      PT LONG TERM GOAL #1   Title  Patient will improve her 10 MWT to 0.8 m/s or more with rw to promote better community ambulation.     Baseline  0.5 seconds with rw (09/06/2018); 0.63 seconds average with rw (10/18/2018); 0.69 m/s average using rw (12/10/2018); 0.59 m/s using SPC and CGA (01/08/2019); 0.60 m/s using SPC (01/21/2019)    Time  8    Period  Weeks    Status  On-going    Target Date  03/28/19      PT LONG TERM GOAL #2   Title  Patient will improve her DGI score using rw or least restrictive AD to 19/24 or more to promote balance.     Baseline  13/24 using rw (09/06/2018); 14/24 using rw (10/29/2018); 16/24 (12/10/2018); 14/24 using SPC (01/21/2019)    Time  8    Period  Weeks    Status  On-going    Target Date  03/28/19      PT LONG TERM GOAL #3   Title  Patient will improve bilateral LE strength by at least 1/2 MMT to promote ability to support herself when performing standing tasks and improve balance.     Time  8    Period  Weeks    Status  On-going    Target Date  03/28/19      PT LONG TERM GOAL  #4   Title  Patient will have a decrease in back pain/ache to 5/10 or less to promote ability to perform standing tasks with less difficulty.    Baseline  8/10 at most for the past 3 months (09/06/2018); 5-6/10 at worst for the past 7 days (10/18/2018); 5/10 at worst for the past 7 days (12/10/2018)    Time  6    Period  Weeks    Status  Achieved            Plan - 01/30/19 1131    Clinical Impression Statement  Pt demonstrates improved ability to ambulate and more confidence doing so today compared to last session. Also demonstrates improved ability to ambulate after working on trunk extensor muscle strength. Pt also demonstrates overall improving ability to ambulate with SPC with pt able to perform her 10 MWT and DGI with SPC instead of her rw. Pt will benefit from continued skilled physical therapy services to improve LE strength, balance, ability to ambulate and perform standing tasks.     History and Personal Factors relevant to plan of care:  Chronicity of condition, back pain, weakness, multiple  comorbidities, age, fear of falling.     Clinical Presentation  Stable    Clinical Presentation due to:  improved ability to ambulate today compared to 2 days ago.     Rehab Potential  Fair    Clinical Impairments Affecting Rehab Potential  (-) chronicity of condition, age, comorbidities; (+) motivated    PT Frequency  2x / week    PT Duration  6 weeks    PT Treatment/Interventions  Aquatic Therapy;Gait training;Therapeutic activities;Therapeutic exercise;Balance training;Neuromuscular re-education;Patient/family education;Manual techniques;Dry needling    PT Next Visit Plan  trunk, hip, scapular strengthening, balance, gait, manual techniques PRN    Consulted and Agree with Plan of Care  Patient       Patient will benefit from skilled therapeutic intervention in order to improve the following deficits and impairments:  Abnormal gait, Decreased activity tolerance, Decreased balance, Decreased  endurance, Decreased range of motion, Decreased strength, Difficulty walking, Improper body mechanics, Postural dysfunction, Pain, Decreased knowledge of use of DME  Visit Diagnosis: Unsteadiness on feet  Difficulty in walking, not elsewhere classified  Muscle weakness (generalized)  History of falling  Chronic low back pain, unspecified back pain laterality, unspecified whether sciatica present     Problem List Patient Active Problem List   Diagnosis Date Noted  . Septic olecranon bursitis of left elbow 03/27/2018  . Lymphedema 10/11/2017  . Chronic venous insufficiency 10/11/2017  . Leg pain 10/11/2017  . Leg swelling 10/11/2017  . COPD (chronic obstructive pulmonary disease) (HCC) 10/11/2017  . Essential hypertension 10/11/2017    Thank you for your referral.  Loralyn Freshwater PT, DPT   01/30/2019, 11:55 AM  [place] [facility] PHYSICAL AND SPORTS MEDICINE [address], [place], [address] Phone: [phone]   Fax:  [phone]  Name: BRYNLI PFOST MRN: [id number] Date of Birth: 1930/08/02
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
## 2019-02-02 NOTE — Progress Notes (Signed)
[name], [name] ([id number]) Visit Report for 01/30/2019 Arrival Information Details Patient Name: [name], [name] Date of Service: 01/30/2019 3:30 PM Medical Record Number: [id number] Patient Account Number: [id number] Date of Birth/Sex: [birth year] (85 y.o. F) Treating RN: Rodell Perna Primary Care Annison Birchard: Aram Beecham Other Clinician: Referring Ulyess Muto: Aram Beecham Treating Hanni Milford/Extender: Altamese [place] in Treatment: 53 Visit Information History Since Last Visit Added or deleted any medications: No Patient Arrived: Walker Any new allergies or adverse reactions: No Arrival Time: 16:03 Had a fall or experienced change in No Accompanied By: self activities of daily living that may affect Transfer Assistance: None risk of falls: Patient Identification Verified: Yes Signs or symptoms of abuse/neglect since last visito No Patient Requires Transmission-Based No Hospitalized since last visit: No Precautions: Implantable device outside of the clinic excluding No Patient Has Alerts: Yes cellular tissue based products placed in the center Patient Alerts: non compressible left since last visit: leg Pain Present Now: No Electronic Signature(s) Signed: 02/01/2019 4:09:31 PM By: Rodell Perna Entered By: Rodell Perna on 01/30/2019 16:03:22 [name] ([id number]) -------------------------------------------------------------------------------- Encounter Discharge Information Details Patient Name: [name] Date of Service: 01/30/2019 3:30 PM Medical Record Number: [id number] Patient Account Number: [id number] Date of Birth/Sex: 1930/01/21 (85 y.o. F) Treating RN: Huel Coventry Primary Care Jakaiya Netherland: Aram Beecham Other Clinician: Referring Jamar Weatherall: Aram Beecham Treating Tasean Mancha/Extender: Altamese [place] in Treatment: 23 Encounter Discharge Information Items Post Procedure Vitals Discharge Condition: Stable Temperature (F): 97.7 Ambulatory  Status: Walker Pulse (bpm): 77 Discharge Destination: Home Respiratory Rate (breaths/min): 16 Transportation: Private Auto Blood Pressure (mmHg): 97/50 Accompanied By: self Schedule Follow-up Appointment: No Clinical Summary of Care: Electronic Signature(s) Signed: 01/30/2019 6:42:35 PM By: Elliot Gurney, BSN, RN, CWS, Kim RN, BSN Entered By: Elliot Gurney, BSN, RN, CWS, Kim on 01/30/2019 16:53:53 [name] ([id number]) -------------------------------------------------------------------------------- Lower Extremity Assessment Details Patient Name: [name] Date of Service: 01/30/2019 3:30 PM Medical Record Number: [id number] Patient Account Number: [id number] Date of Birth/Sex: 12/21/1929 (85 y.o. F) Treating RN: Rodell Perna Primary Care Alverto Shedd: Aram Beecham Other Clinician: Referring Konner Saiz: Aram Beecham Treating Bynum Mccullars/Extender: Altamese [place]aliima[place] in Treatment: 9 Electronic Signature(s) Signed: 02/01/2019 4:09:31 PM By: Rodell Perna Entered By: Rodell Perna on 01/30/2019 16:04:36 [name] ([id number]) -------------------------------------------------------------------------------- Multi Wound Chart Details Patient Name: [name] Date of Service: 01/30/2019 3:30 PM Medical Record Number: [id number] Patient Account Number: [id number] Date of Birth/Sex: 06-17-1930 (85 y.o. F) Treating RN: Huel Coventry Primary Care Hayat Warbington: Aram Beecham Other Clinician: Referring Abigaile Rossie: Aram Beecham Treating Dmauri Rosenow/Extender: Altamese [place] in Treatment: 29 Vital Signs Height(in): 60 Pulse(bpm): 77 Weight(lbs): 123 Blood Pressure(mmHg): 97/50 Body Mass Index(BMI): 24 Temperature(F): 97.7 Respiratory Rate 16 (breaths/min): Photos: [9:No Photos] [N/A:N/A] Wound Location: [9:Left Elbow] [N/A:N/A] Wounding Event: [9:Surgical Injury] [N/A:N/A] Primary Etiology: [9:Open Surgical Wound] [N/A:N/A] Comorbid History: [9:Cataracts, Glaucoma, Optic  Neuritis, Chronic sinus problems/congestion, Middle ear problems] [N/A:N/A] Date Acquired: [9:01/26/2018] [N/A:N/A] Weeks of Treatment: [9:39] [N/A:N/A] Wound Status: [9:Open] [N/A:N/A] Measurements L x W x D [9:0.5x0.4x0.4] [N/A:N/A] (cm) Area (cm) : [9:0.157] [N/A:N/A] Volume (cm) : [9:0.063] [N/A:N/A] % Reduction in Area: [9:-121.10%] [N/A:N/A] % Reduction in Volume: [9:-350.00%] [N/A:N/A] Starting Position 1 [9:4] (o'clock): Ending Position 1 [9:12] (o'clock): Maximum Distance 1 (cm): [9:0.9] Undermining: [9:Yes] [N/A:N/A] Classification: [9:Full Thickness With Exposed Support Structures] [N/A:N/A] Exudate Amount: [9:Large] [N/A:N/A] Exudate Type: [9:Purulent] [N/A:N/A] Exudate Color: [9:yellow, brown, green] [N/A:N/A] Wound Margin: [9:Distinct, outline attached] [N/A:N/A] Granulation Amount: [9:None Present (0%)] [N/A:N/A] Necrotic Amount: [9:Large (67-100%)] [N/A:N/A]  Exposed Structures: [9:Fat Layer (Subcutaneous Tissue) Exposed: Yes Fascia: No Tendon: No Muscle: No] [N/A:N/A] Joint: No Bone: No Epithelialization: None N/A N/A Periwound Skin Texture: Excoriation: No N/A N/A Induration: No Callus: No Crepitus: No Rash: No Scarring: No Periwound Skin Moisture: Maceration: Yes N/A N/A Dry/Scaly: No Periwound Skin Color: Erythema: Yes N/A N/A Atrophie Blanche: No Cyanosis: No Ecchymosis: No Hemosiderin Staining: No Mottled: No Pallor: No Rubor: No Erythema Location: Circumferential N/A N/A Temperature: No Abnormality N/A N/A Tenderness on Palpation: Yes N/A N/A Wound Preparation: Ulcer Cleansing: N/A N/A Rinsed/Irrigated with Saline, Not Cleansed: Apligraft Topical Anesthetic Applied: None Procedures Performed: Cellular or Tissue Based N/A N/A Product Treatment Notes Wound #9 (Left Elbow) Notes PuraPly, steri strips, mepitel, gauze bolster and border foam dressing Electronic Signature(s) Signed: 01/30/2019 6:18:32 PM By: Baltazar Najjarobson, Michael MD Entered  By: Baltazar Najjarobson, Michael on 01/30/2019 17:44:29 [name], [name] ([id number]) -------------------------------------------------------------------------------- Multi-Disciplinary Care Plan Details Patient Name: [name], [name] Date of Service: 01/30/2019 3:30 PM Medical Record Number: [id number] Patient Account Number: [id number] Date of Birth/Sex: 12/18/1929 (85 y.o. F) Treating RN: Huel CoventryWoody, Kim Primary Care Braeleigh Pyper: Aram BeechamSparks, Jeffrey Other Clinician: Referring Gwynneth Fabio: Aram BeechamSparks, Jeffrey Treating Serene Kopf/Extender: Altamese CarolinaOBSON, [name] Weeks in Treatment: 7663 Active Inactive Soft Tissue Infection Nursing Diagnoses: Impaired tissue integrity Goals: Patient's soft tissue infection will resolve Date Initiated: 07/18/2018 Target Resolution Date: 07/27/2018 Goal Status: Active Interventions: Assess signs and symptoms of infection every visit Treatment Activities: Culture and sensitivity : 07/18/2018 Systemic antibiotics : 07/18/2018 Notes: Wound/Skin Impairment Nursing Diagnoses: Impaired tissue integrity Knowledge deficit related to ulceration/compromised skin integrity Goals: Patient/caregiver will verbalize understanding of skin care regimen Date Initiated: 11/14/2017 Target Resolution Date: 11/28/2017 Goal Status: Active Ulcer/skin breakdown will have a volume reduction of 30% by week 4 Date Initiated: 11/14/2017 Target Resolution Date: 11/28/2017 Goal Status: Active Interventions: Assess patient/caregiver ability to obtain necessary supplies Assess patient/caregiver ability to perform ulcer/skin care regimen upon admission and as needed Assess ulceration(s) every visit Treatment Activities: Skin care regimen initiated : 11/14/2017 [name], [name] ([id number]) Notes: Electronic Signature(s) Signed: 01/30/2019 6:42:35 PM By: Elliot GurneyWoody, BSN, RN, CWS, Kim RN, BSN Entered By: Elliot GurneyWoody, BSN, RN, CWS, Kim on 01/30/2019 16:45:36 [name], Carlisle V.  ([id number]) -------------------------------------------------------------------------------- Pain Assessment Details Patient Name: [name], [name] Date of Service: 01/30/2019 3:30 PM Medical Record Number: [id number] Patient Account Number: [id number] Date of Birth/Sex: 02/15/1930 (85 y.o. F) Treating RN: Rodell PernaScott, Dajea Primary Care Deeanna Beightol: Aram BeechamSparks, Jeffrey Other Clinician: Referring Jenelle Drennon: Aram BeechamSparks, Jeffrey Treating Airen Dales/Extender: Altamese CarolinaOBSON, [name] Weeks in Treatment: 1663 Active Problems Location of Pain Severity and Description of Pain Patient Has Paino No Site Locations Pain Management and Medication Current Pain Management: Electronic Signature(s) Signed: 02/01/2019 4:09:31 PM By: Rodell PernaScott, Dajea Entered By: Rodell PernaScott, Dajea on 01/30/2019 16:03:28 [name], Eufemia V. ([id number]) -------------------------------------------------------------------------------- Patient/Caregiver Education Details Patient Name: [name], [name] Date of Service: 01/30/2019 3:30 PM Medical Record Number: [id number] Patient Account Number: [id number] Date of Birth/Gender: 08/28/1930 (85 y.o. F) Treating RN: Huel CoventryWoody, Kim Primary Care Physician: Aram BeechamSparks, Jeffrey Other Clinician: Referring Physician: Aram BeechamSparks, Jeffrey Treating Physician/Extender: Altamese CarolinaOBSON, [name] Weeks in Treatment: 4163 Education Assessment Education Provided To: Patient Education Topics Provided Wound/Skin Impairment: Handouts: Caring for Your Ulcer Methods: Demonstration, Explain/Verbal Responses: State content correctly Electronic Signature(s) Signed: 01/30/2019 6:42:35 PM By: Elliot GurneyWoody, BSN, RN, CWS, Kim RN, BSN Entered By: Elliot GurneyWoody, BSN, RN, CWS, Kim on 01/30/2019 16:54:02 [name], [name] ([id number]) -------------------------------------------------------------------------------- Wound Assessment Details Patient Name: [name], [name] Date of Service: 01/30/2019 3:30 PM Medical Record Number: [id number] Patient Account  Number: [id number] Date  of Birth/Sex: 09/08/1930 (85 y.o. F) Treating RN: Huel Coventry Primary Care Audra Kagel: Aram Beecham Other Clinician: Referring Anella Nakata: Aram Beecham Treating Alexah Kivett/Extender: Altamese [place] in Treatment: 53 Wound Status Wound Number: 9 Primary Open Surgical Wound Etiology: Wound Location: Left Elbow Wound Open Wounding Event: Surgical Injury Status: Date Acquired: 01/26/2018 Comorbid Cataracts, Glaucoma, Optic Neuritis, Chronic Weeks Of Treatment: 39 History: sinus problems/congestion, Middle ear problems Clustered Wound: No Photos Photo Uploaded By: Rodell Perna on 02/01/2019 16:04:38 Wound Measurements Length: (cm) 0.5 Width: (cm) 0.4 Depth: (cm) 0.4 Area: (cm) 0.157 Volume: (cm) 0.063 % Reduction in Area: -121.1% % Reduction in Volume: -350% Epithelialization: None Tunneling: No Undermining: Yes Starting Position (o'clock): 4 Ending Position (o'clock): 12 Maximum Distance: (cm) 0.9 Wound Description Full Thickness With Exposed Support Classification: Structures Wound Margin: Distinct, outline attached Exudate Large Amount: Exudate Type: Purulent Exudate Color: yellow, brown, green Foul Odor After Cleansing: No Slough/Fibrino No Wound Bed Granulation Amount: None Present (0%) Exposed Structure Necrotic Amount: Large (67-100%) Fascia Exposed: No Necrotic Quality: Adherent Slough Fat Layer (Subcutaneous Tissue) Exposed: Yes [name], [place] ([id number]) Tendon Exposed: No Muscle Exposed: No Joint Exposed: No Bone Exposed: No Periwound Skin Texture Texture Color No Abnormalities Noted: No No Abnormalities Noted: No Callus: No Atrophie Blanche: No Crepitus: No Cyanosis: No Excoriation: No Ecchymosis: No Induration: No Erythema: Yes Rash: No Erythema Location: Circumferential Scarring: No Hemosiderin Staining: No Mottled: No Moisture Pallor: No No Abnormalities Noted: No Rubor: No Dry / Scaly: No Maceration: Yes Temperature /  Pain Temperature: No Abnormality Tenderness on Palpation: Yes Wound Preparation Ulcer Cleansing: Rinsed/Irrigated with Saline, Not Cleansed: Apligraft, Topical Anesthetic Applied: None Treatment Notes Wound #9 (Left Elbow) Notes PuraPly, steri strips, mepitel, gauze bolster and border foam dressing Electronic Signature(s) Signed: 01/30/2019 6:42:35 PM By: Elliot Gurney, BSN, RN, CWS, Kim RN, BSN Entered By: Elliot Gurney, BSN, RN, CWS, Kim on 01/30/2019 16:43:48 [name] ([id number]) -------------------------------------------------------------------------------- Vitals Details Patient Name: [name] Date of Service: 01/30/2019 3:30 PM Medical Record Number: [id number] Patient Account Number: [id number] Date of Birth/Sex: 07/17/1930 (85 y.o. F) Treating RN: Rodell Perna Primary Care Eliya Bubar: Aram Beecham Other Clinician: Referring Jusiah Aguayo: Aram Beecham Treating Maikayla Beggs/Extender: Altamese [place] in Treatment: 17 Vital Signs Time Taken: 16:01 Temperature (F): 97.7 Height (in): 60 Pulse (bpm): 77 Weight (lbs): 123 Respiratory Rate (breaths/min): 16 Body Mass Index (BMI): 24 Blood Pressure (mmHg): 97/50 Reference Range: 80 - 120 mg / dl Electronic Signature(s) Signed: 02/01/2019 4:09:31 PM By: Rodell Perna Entered By: Rodell Perna on 01/30/2019 16:03:50

## 2019-02-04 ENCOUNTER — Ambulatory Visit: Payer: Medicare Other

## 2019-02-04 DIAGNOSIS — M6281 Muscle weakness (generalized): Secondary | ICD-10-CM

## 2019-02-04 DIAGNOSIS — M545 Low back pain, unspecified: Secondary | ICD-10-CM

## 2019-02-04 DIAGNOSIS — R2681 Unsteadiness on feet: Secondary | ICD-10-CM

## 2019-02-04 DIAGNOSIS — R262 Difficulty in walking, not elsewhere classified: Secondary | ICD-10-CM

## 2019-02-04 DIAGNOSIS — Z9181 History of falling: Secondary | ICD-10-CM

## 2019-02-04 DIAGNOSIS — G8929 Other chronic pain: Secondary | ICD-10-CM

## 2019-02-04 NOTE — Therapy (Signed)
[facility] PHYSICAL AND SPORTS MEDICINE [address] [place], [place], [address] Phone: [phone]   Fax:  [phone]  Physical Therapy Treatment And Progress Report (12/17/2018 - 02/04/2019)    Patient Details  Name: Natasha BassetJanice V Melfi MRN: [id number] Date of Birth: 12/19/1929 Referring Provider (PT): Cristopher PeruHemang Shah, MD   Encounter Date: 02/04/2019  PT End of Session - 02/04/19 1032    Visit Number  30    Number of Visits  62    Date for PT Re-Evaluation  03/28/19    Authorization Type  "10"    Authorization Time Period  of 10 progress report    PT Start Time  1033    PT Stop Time  1120    PT Time Calculation (min)  47 min    Equipment Utilized During Treatment  Gait belt    Activity Tolerance  Patient tolerated treatment well    Behavior During Therapy  WFL for tasks assessed/performed       Past Medical History:  Diagnosis Date  . Anemia   . GERD (gastroesophageal reflux disease)   . Hypertension   . Peripheral vascular disease (HCC)    possible neuropathies in lower extremeties  . PONV (postoperative nausea and vomiting)    happens sometimes but better with pre med of zofran  . Presence of permanent cardiac pacemaker   . Syncope     Past Surgical History:  Procedure Laterality Date  . ABDOMINAL HYSTERECTOMY  1969  . APPENDECTOMY  1969   with hysterectomy  . BACK SURGERY  2001   rods in back. surgery on back x 3  . COLONOSCOPY    . I&D EXTREMITY Left 03/27/2018   Procedure: IRRIGATION AND DEBRIDEMENT LEFT ELBOW / OLECRANON BURSA;  Surgeon: Christena FlakePoggi, John J, MD;  Location: [facility];  Service: Orthopedics;  Laterality: Left;  . I&D EXTREMITY Left 06/05/2018   Procedure: IRRIGATION AND DEBRIDEMENT EXTREMITY;  Surgeon: Christena FlakePoggi, John J, MD;  Location: [facility];  Service: Orthopedics;  Laterality: Left;  . INSERT / REPLACE / REMOVE PACEMAKER  2004  . JOINT REPLACEMENT Left 2004   partial knee replacement  . OLECRANON BURSECTOMY Left  03/27/2018   Procedure: LEFT OLECRANON BURSA;  Surgeon: Christena FlakePoggi, John J, MD;  Location: [facility];  Service: Orthopedics;  Laterality: Left;  . PACEMAKER INSERTION  2014   dual chamber for complete heart block    There were no vitals filed for this visit.  Subjective Assessment - 02/04/19 1035    Subjective  Pt states she needs WD40 to lubricate her joints. Mornings are not as good. Hands have been really swollen since being off her methrotrexate.  Body does not like being off the medication.     Pertinent History  Imparied gait. Pt states that she has had balance issues for too long. Feels more comfortable walking with her rw. Had tests which showed that she has neuropathy. However [facility] that her balance is coming from her back. Just wants to be strong so she can walk.  Started walking with her rw off and on for the past several months.  Also feels like she might need a new sleep number bed. Also had 2 wound surgeries at [name]. Pt fell in February 2019. Had to stop PT due to the L elbow surgery April 06, 2018 to remove a bursa. The wound still has not healed.   Currently sees a wound doctor.   Feels numbness  L foot from the ankle down.  No R foot numbness.  Sometimes unsure of where her L LE is in space.  Pt also states that she tires too quickly.  Has not been able to walk her dog in 2 years. Has not really been doing exercises at home except the sit <> stand. Pt states that prolonged sitting without back support will increase back pain     Limitations  Standing;Walking    How long can you sit comfortably?  1 hr    How long can you stand comfortably?  10 minutes    How long can you walk comfortably?  2 or 3 blocks    Diagnostic tests  EMG testing BLEs and BUEs which were negative    Patient Stated Goals  Improve LE strength. Wants to use the machines at the gym. Walk more steadily. Be able to take her dog (16 lbs) out on a leash.     Currently in Pain?  Other (Comment)    no complain of pain.    Pain Onset  More than a month ago                               PT Education - 02/04/19 1044    Education provided  Yes    Education Details  ther-ex    [employer]) Educated  Patient    Methods  Explanation;Demonstration;Tactile cues;Verbal cues    Comprehension  Returned demonstration;Verbalized understanding      Objectives  Pt ambulating with rw  TTP bilateral greater trochanter L >R   L elbow wound  No latex band allergies    MedbridgeAccess Code: [email or web address]   Therapeutic exercise   Seated resisted trunk extension isometrics at neutral 10x5 seconds for 3 sets  Leg press seat 2,  B LE plate 45 for [id number] Cues for femoral control Performed to promote LE strengthening (glute and quad) as well as for weight bearing exercise to help promote bone density.  Side stepping 32 ft to the R, and 15 ft (x 2) to the L (needed a rest break) mod A and hand held assist to promote glute med muscle strength and balance.    Improved exercise technique, movement at target joints, use of target muscles after min to mod verbal, visual, tactile cues.     Gait   Backwards walk with hand held assist min to mod A. 32 ft x 2. No LOB.  Gait forward CGA. Hand held assist (min A)  to no UE assist. Cues for increased hip and knee flexion  150 ft. No LOB  110 ft. No LOB   Stepping over 6 mini hurdles with hand held assist. Able to perform 4x.    Able to clear all hurdles 2 repetitions.   Gait with SPC to no AD assist 32 ft with cues for increase speed 3x  18 seconds on 3rd try. (0.56 m/s)   Improved technique, movement at target joints, use of target muscles after mod verbal, visual, tactile cues.    Pt able to ambulate forward with to without UE assist up to 150 ft today without LOB. Improving ability to walk forward. Also able to walk backward with hand held min to  mod A without LOB. Better able to walk today with less assist compared to previous 2 sessions. Slightly decreased 10 MWT speed today but pt ambulated without use of SPC at times. Overall, pt seems to be making  progress with PT towards improved walking. Pt still demonstrates LE weakness, decreased balance, and difficulty with ambulating and performing standing tasks and would benefit from continued skilled physical therapy services to address the aforementioned deficits.     PT Short Term Goals - 01/28/19 1929      PT SHORT TERM GOAL #1   Title  Patient will be independent with her HEP to improve strength and balance.     Time  3    Period  Weeks    Status  On-going    Target Date  02/21/19        PT Long Term Goals - 02/04/19 1228      PT LONG TERM GOAL #1   Title  Patient will improve her 10 MWT to 0.8 m/s or more with rw to promote better community ambulation.     Baseline  0.5 seconds with rw (09/06/2018); 0.63 seconds average with rw (10/18/2018); 0.69 m/s average using rw (12/10/2018); 0.59 m/s using SPC and CGA (01/08/2019); 0.60 m/s using SPC (01/21/2019); 0.56 m/s with SPC to no AD assist, CGA (02/04/2019)    Time  8    Period  Weeks    Status  On-going    Target Date  03/28/19      PT LONG TERM GOAL #2   Title  Patient will improve her DGI score using rw or least restrictive AD to 19/24 or more to promote balance.     Baseline  13/24 using rw (09/06/2018); 14/24 using rw (10/29/2018); 16/24 (12/10/2018); 14/24 using SPC (01/21/2019)    Time  8    Period  Weeks    Status  On-going    Target Date  03/28/19      PT LONG TERM GOAL #3   Title  Patient will improve bilateral LE strength by at least 1/2 MMT to promote ability to support herself when performing standing tasks and improve balance.     Time  8    Period  Weeks    Status  On-going    Target Date  03/28/19      PT LONG TERM GOAL #4   Title  Patient will have a decrease in back pain/ache to 5/10 or less to promote  ability to perform standing tasks with less difficulty.    Baseline  8/10 at most for the past 3 months (09/06/2018); 5-6/10 at worst for the past 7 days (10/18/2018); 5/10 at worst for the past 7 days (12/10/2018)    Time  6    Period  Weeks    Status  Achieved            Plan - 02/04/19 1044    Clinical Impression Statement  Pt able to ambulate forward with to without UE assist up to 150 ft today without LOB. Improving ability to walk forward. Also able to walk backward with hand held min to mod A without LOB. Better able to walk today with less assist compared to previous 2 sessions. Slightly decreased 10 MWT speed today but pt ambulated without use of SPC at times. Overall, pt seems to be making progress with PT towards improved walking. Pt still demonstrates LE weakness, decreased balance, and difficulty with ambulating and performing standing tasks and would benefit from continued skilled physical therapy services to address the aforementioned deficits.     History and Personal Factors relevant to plan of care:  Chronicity of condition, back pain, weakness, multiple comorbitities, age, fear of falling    Clinical  Presentation  Stable    Clinical Presentation due to:  Improved ability to ambulate compared to a few days ago     Clinical Decision Making  Low    Rehab Potential  Fair    Clinical Impairments Affecting Rehab Potential  (-) chronicity of condition, age, comorbidities; (+) motivated    PT Frequency  2x / week    PT Duration  8 weeks    PT Treatment/Interventions  Aquatic Therapy;Gait training;Therapeutic activities;Therapeutic exercise;Balance training;Neuromuscular re-education;Patient/family education;Manual techniques;Dry needling    PT Next Visit Plan  trunk, hip, scapular strengthening, balance, gait, manual techniques PRN    Consulted and Agree with Plan of Care  Patient       Patient will benefit from skilled therapeutic intervention in order to improve the following  deficits and impairments:  Abnormal gait, Decreased activity tolerance, Decreased balance, Decreased endurance, Decreased range of motion, Decreased strength, Difficulty walking, Improper body mechanics, Postural dysfunction, Pain, Decreased knowledge of use of DME  Visit Diagnosis: Unsteadiness on feet  Difficulty in walking, not elsewhere classified  Muscle weakness (generalized)  History of falling  Chronic low back pain, unspecified back pain laterality, unspecified whether sciatica present     Problem List Patient Active Problem List   Diagnosis Date Noted  . Septic olecranon bursitis of left elbow 03/27/2018  . Lymphedema 10/11/2017  . Chronic venous insufficiency 10/11/2017  . Leg pain 10/11/2017  . Leg swelling 10/11/2017  . COPD (chronic obstructive pulmonary disease) (HCC) 10/11/2017  . Essential hypertension 10/11/2017    Thank you for your referral.  Loralyn FreshwaterMiguel Laygo PT, DPT  02/04/2019, 12:46 PM  [facility] MEDICINE [address] [place], [place], [address] Phone: [phone]   Fax:  [phone]  Name: Natasha BassetJanice V Sprunger MRN: [id number] Date of Birth: 08/16/1930

## 2019-02-06 ENCOUNTER — Ambulatory Visit: Payer: Medicare Other

## 2019-02-06 ENCOUNTER — Encounter: Payer: Medicare Other | Admitting: Internal Medicine

## 2019-02-06 DIAGNOSIS — R262 Difficulty in walking, not elsewhere classified: Secondary | ICD-10-CM

## 2019-02-06 DIAGNOSIS — R2681 Unsteadiness on feet: Secondary | ICD-10-CM | POA: Diagnosis not present

## 2019-02-06 DIAGNOSIS — M6281 Muscle weakness (generalized): Secondary | ICD-10-CM

## 2019-02-06 DIAGNOSIS — L98495 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: Secondary | ICD-10-CM | POA: Diagnosis not present

## 2019-02-06 DIAGNOSIS — Z9181 History of falling: Secondary | ICD-10-CM

## 2019-02-06 NOTE — Therapy (Signed)
[facility] [facility] PHYSICAL AND SPORTS MEDICINE [address], [place], [address] Phone: [phone]   Fax:  [phone]  Physical Therapy Treatment  Patient Details  Name: [name] MRN: [id number] Date of Birth: Apr 19, 1930 Referring Provider (PT): Cristopher Peru, MD   Encounter Date: 02/06/2019  PT End of Session - 02/06/19 1439    Visit Number  31    Number of Visits  62    Date for PT Re-Evaluation  03/28/19    Authorization Type  1    Authorization Time Period  of 10 progress report    PT Start Time  1439    PT Stop Time  1519    PT Time Calculation (min)  40 min    Equipment Utilized During Treatment  Gait belt    Activity Tolerance  Patient tolerated treatment well    Behavior During Therapy  WFL for tasks assessed/performed       Past Medical History:  Diagnosis Date  . Anemia   . GERD (gastroesophageal reflux disease)   . Hypertension   . Peripheral vascular disease (HCC)    possible neuropathies in lower extremeties  . PONV (postoperative nausea and vomiting)    happens sometimes but better with pre med of zofran  . Presence of permanent cardiac pacemaker   . Syncope     Past Surgical History:  Procedure Laterality Date  . ABDOMINAL HYSTERECTOMY  1969  . APPENDECTOMY  1969   with hysterectomy  . BACK SURGERY  2001   rods in back. surgery on back x 3  . COLONOSCOPY    . I&D EXTREMITY Left 03/27/2018   Procedure: IRRIGATION AND DEBRIDEMENT LEFT ELBOW / OLECRANON BURSA;  Surgeon: Christena Flake, MD;  Location: [facility];  Service: Orthopedics;  Laterality: Left;  . I&D EXTREMITY Left 06/05/2018   Procedure: IRRIGATION AND DEBRIDEMENT EXTREMITY;  Surgeon: Christena Flake, MD;  Location: [facility];  Service: Orthopedics;  Laterality: Left;  . INSERT / REPLACE / REMOVE PACEMAKER  2004  . JOINT REPLACEMENT Left 2004   partial knee replacement  . OLECRANON BURSECTOMY Left 03/27/2018   Procedure: LEFT OLECRANON BURSA;  Surgeon:  Christena Flake, MD;  Location: [facility];  Service: Orthopedics;  Laterality: Left;  . PACEMAKER INSERTION  2014   dual chamber for complete heart block    There were no vitals filed for this visit.  Subjective Assessment - 02/06/19 1442    Subjective  Pt states having a busy day. The not good report from the wound center bothers her. The depth of her wound has not filled in.     Pertinent History  Imparied gait. Pt states that she has had balance issues for too long. Feels more comfortable walking with her rw. Had tests which showed that she has neuropathy. However the doctor at [facility] said that her balance is coming from her back. Just wants to be strong so she can walk.  Started walking with her rw off and on for the past several months.  Also feels like she might need a new sleep number bed. Also had 2 wound surgeries at [name]. Pt fell in February 2019. Had to stop PT due to the L elbow surgery April 06, 2018 to remove a bursa. The wound still has not healed.   Currently sees a wound doctor.   Feels numbness L foot from the ankle down.  No R foot numbness.  Sometimes unsure of where  her L LE is in space.  Pt also states that she tires too quickly.  Has not been able to walk her dog in 2 years. Has not really been doing exercises at home except the sit <> stand. Pt states that prolonged sitting without back support will increase back pain     Limitations  Standing;Walking    How long can you sit comfortably?  1 hr    How long can you stand comfortably?  10 minutes    How long can you walk comfortably?  2 or 3 blocks    Diagnostic tests  EMG testing BLEs and BUEs which were negative    Patient Stated Goals  Improve LE strength. Wants to use the machines at the gym. Walk more steadily. Be able to take her dog (16 lbs) out on a leash.     Currently in Pain?  Other (Comment)    Pain Score  --   no complain of pain   Pain Onset  More than a month ago                                PT Education - 02/06/19 1447    Education provided  Yes    Education Details  ther-ex    [employer] Educated  Patient    Methods  Explanation;Demonstration;Tactile cues;Verbal cues    Comprehension  Returned demonstration;Verbalized understanding         Objectives  Pt ambulating with rw  TTP bilateral greater trochanter L >R   L elbow wound  No latex band allergies    MedbridgeAccess Code: [email or web address]   Therapeutic exercise   Seated resisted trunk extension isometrics at neutral 10x5 seconds for 3 sets  Standing hip abduction with B UE assist 3 lbs ankle weight  R 5x3  L 5x3  Difficulty with 3rd set    Leg press seat 2,  B LE plate 45 for [id number]  Performed to promote LE strengthening (glute and quad) as well as for weight bearing exercise to help promote bone density.   Side stepping 20 ft to the R, and 20 ft to the L mod A and hand held assist to promote glute med muscle strength and balance.   Improved exercise technique, movement at target joints, use of target muscles after min to mod verbal, visual, tactile cues.    Gait   Gait forward CGA. Hand held assist (min A)  to no UE assist. Cues for increased hip and knee flexion             150 ft. No LOB              150 ft. LOB a few times during the last 30 ft when pt tries to walk without holding Pt hand.   Able to ambulate longer distance compared to previous session suggesting improved activity tolerance.   Backwards walk with hand held assist min to mod A. 32 ft x 2. No LOB.   Improved technique, movement at target joints, use of target muscles after mod verbal, visual, tactile cues.   Good muscle use felt with exercises. Pt tolerated session well without aggravation of symptoms. Pt better able to ambulate with one hand assist when assured that PT is there with her and with cues  to improve hip and knee flexion.     Continued performing trunk and hip strengthening to promote ability to ambulate more steadily. Able  to ambulate longer distance today with hand held assist. LOB a few times towards the end of second round of 150 ft when pt tried to ambulate without holding onto PT hand. Pt overall seems to be better able to ambulate without assist from rw but either from [facility] of PT hand held assist with one hand. Pt tolerated session well without aggravation of symptom. Pt will benefit from continued skilled physical therapy services to improve strength, function, and ability to ambulate.    PT Short Term Goals - 01/28/19 1929      PT SHORT TERM GOAL #1   Title  Patient will be independent with her HEP to improve strength and balance.     Time  3    Period  Weeks    Status  On-going    Target Date  02/21/19        PT Long Term Goals - 02/04/19 1228      PT LONG TERM GOAL #1   Title  Patient will improve her 10 MWT to 0.8 m/s or more with rw to promote better community ambulation.     Baseline  0.5 seconds with rw (09/06/2018); 0.63 seconds average with rw (10/18/2018); 0.69 m/s average using rw (12/10/2018); 0.59 m/s using SPC and CGA (01/08/2019); 0.60 m/s using SPC (01/21/2019); 0.56 m/s with SPC to no AD assist, CGA (02/04/2019)    Time  8    Period  Weeks    Status  On-going    Target Date  03/28/19      PT LONG TERM GOAL #2   Title  Patient will improve her DGI score using rw or least restrictive AD to 19/24 or more to promote balance.     Baseline  13/24 using rw (09/06/2018); 14/24 using rw (10/29/2018); 16/24 (12/10/2018); 14/24 using SPC (01/21/2019)    Time  8    Period  Weeks    Status  On-going    Target Date  03/28/19      PT LONG TERM GOAL #3   Title  Patient will improve bilateral LE strength by at least 1/2 MMT to promote ability to support herself when performing standing tasks and improve balance.     Time  8    Period  Weeks    Status  On-going     Target Date  03/28/19      PT LONG TERM GOAL #4   Title  Patient will have a decrease in back pain/ache to 5/10 or less to promote ability to perform standing tasks with less difficulty.    Baseline  8/10 at most for the past 3 months (09/06/2018); 5-6/10 at worst for the past 7 days (10/18/2018); 5/10 at worst for the past 7 days (12/10/2018)    Time  6    Period  Weeks    Status  Achieved            Plan - 02/06/19 1456    Clinical Impression Statement  Continued performing trunk and hip strengthening to promote ability to ambulate more steadily. Able to ambulate longer distance today with hand held assist. LOB a few times towards the end of second round of 150 ft when pt tried to ambulate without holding onto PT hand. Pt overall seems to be better able to ambulate without assist from rw but either from [facility] of PT hand held assist with one hand. Pt tolerated session well without aggravation of symptom. Pt will benefit from continued skilled physical therapy services to  improve strength, function, and ability to ambulate.     Rehab Potential  Fair    Clinical Impairments Affecting Rehab Potential  (-) chronicity of condition, age, comorbidities; (+) motivated    PT Frequency  2x / week    PT Duration  8 weeks    PT Treatment/Interventions  Aquatic Therapy;Gait training;Therapeutic activities;Therapeutic exercise;Balance training;Neuromuscular re-education;Patient/family education;Manual techniques;Dry needling    PT Next Visit Plan  trunk, hip, scapular strengthening, balance, gait, manual techniques PRN    Consulted and Agree with Plan of Care  Patient       Patient will benefit from skilled therapeutic intervention in order to improve the following deficits and impairments:  Abnormal gait, Decreased activity tolerance, Decreased balance, Decreased endurance, Decreased range of motion, Decreased strength, Difficulty walking, Improper body mechanics, Postural dysfunction, Pain, Decreased  knowledge of use of DME  Visit Diagnosis: Unsteadiness on feet  Difficulty in walking, not elsewhere classified  Muscle weakness (generalized)  History of falling     Problem List Patient Active Problem List   Diagnosis Date Noted  . Septic olecranon bursitis of left elbow 03/27/2018  . Lymphedema 10/11/2017  . Chronic venous insufficiency 10/11/2017  . Leg pain 10/11/2017  . Leg swelling 10/11/2017  . COPD (chronic obstructive pulmonary disease) (HCC) 10/11/2017  . Essential hypertension 10/11/2017    Loralyn Freshwater PT, DPT   02/06/2019, 3:31 PM  [place] [facility] PHYSICAL AND SPORTS MEDICINE [address] [place], [address] Phone: [phone]   Fax:  [phone]  Name: CORELLA DEMCHAK MRN: [id number] Date of Birth: 08-Feb-1930
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
## 2019-02-08 NOTE — Progress Notes (Signed)
IMARI, QUIRINDONGO ([id number]) Visit Report for 02/06/2019 Arrival Information Details Patient Name: [name], [name]. Date of Service: 02/06/2019 12:30 PM Medical Record Number: [id number] Patient Account Number: [id number] Date of Birth/Sex: May 12, 1930 (85 y.o. F) Treating RN: Arnette Norris Primary Care Damario Gillie: Aram Beecham Other Clinician: Referring Pierrette Scheu: Aram Beecham Treating Indica Marcott/Extender: Altamese [place] in Treatment: 45 Visit Information History Since Last Visit Added or deleted any medications: No Patient Arrived: Walker Any new allergies or adverse reactions: No Arrival Time: 12:32 Had a fall or experienced change in No Accompanied By: self activities of daily living that may affect Transfer Assistance: None risk of falls: Patient Identification Verified: Yes Signs or symptoms of abuse/neglect since last visito No Secondary Verification Process Yes Hospitalized since last visit: No Completed: Has Dressing in Place as Prescribed: Yes Patient Requires Transmission-Based No Pain Present Now: No Precautions: Patient Has Alerts: Yes Patient Alerts: non compressible left leg Electronic Signature(s) Signed: 02/07/2019 7:54:32 AM By: Arnette Norris Entered By: Arnette Norris on 02/06/2019 12:32:30 [name] ([id number]) -------------------------------------------------------------------------------- Encounter Discharge Information Details Patient Name: [name] Date of Service: 02/06/2019 12:30 PM Medical Record Number: [id number] Patient Account Number: [id number] Date of Birth/Sex: Sep 02, 1930 (85 y.o. F) Treating RN: Rodell Perna Primary Care Shian Goodnow: Aram Beecham Other Clinician: Referring Devina Bezold: Aram Beecham Treating Willistine Ferrall/Extender: Altamese [place] in Treatment: 28 Encounter Discharge Information Items Post Procedure Vitals Discharge Condition: Stable Temperature (F): 97.9 Ambulatory Status: Walker Pulse  (bpm): 62 Discharge Destination: Home Respiratory Rate (breaths/min): 16 Transportation: Private Auto Blood Pressure (mmHg): 147/63 Accompanied By: self Schedule Follow-up Appointment: Yes Clinical Summary of Care: Electronic Signature(s) Signed: 02/07/2019 8:11:22 AM By: Rodell Perna Entered By: Rodell Perna on 02/06/2019 13:08:53 [name] ([id number]) -------------------------------------------------------------------------------- Lower Extremity Assessment Details Patient Name: [name] Date of Service: 02/06/2019 12:30 PM Medical Record Number: [id number] Patient Account Number: [id number] Date of Birth/Sex: 1930/01/02 (85 y.o. F) Treating RN: Arnette Norris Primary Care Courteny Egler: Aram Beecham Other Clinician: Referring Deniel Mcquiston: Aram Beecham Treating Urbano Milhouse/Extender: Altamese [place] in Treatment: 23 Electronic Signature(s) Signed: 02/07/2019 7:54:32 AM By: Arnette Norris Entered By: Arnette Norris on 02/06/2019 12:44:37 [name] ([id number]) -------------------------------------------------------------------------------- Multi Wound Chart Details Patient Name: [name] Date of Service: 02/06/2019 12:30 PM Medical Record Number: [id number] Patient Account Number: [id number] Date of Birth/Sex: [birth year] (85 y.o. F) Treating RN: Huel Coventry Primary Care Drelyn Pistilli: Aram Beecham Other Clinician: Referring Thadd Apuzzo: Aram Beecham Treating Kynadee Dam/Extender: Altamese [place] in Treatment: 40 Vital Signs Height(in): 60 Pulse(bpm): 62 Weight(lbs): 123 Blood Pressure(mmHg): 147/63 Body Mass Index(BMI): 24 Temperature(F): 97.9 Respiratory Rate 18 (breaths/min): Photos: [9:No Photos] [N/A:N/A] Wound Location: [9:Left Elbow] [N/A:N/A] Wounding Event: [9:Surgical Injury] [N/A:N/A] Primary Etiology: [9:Open Surgical Wound] [N/A:N/A] Comorbid History: [9:Cataracts, Glaucoma, Optic Neuritis, Chronic sinus  problems/congestion, Middle ear problems] [N/A:N/A] Date Acquired: [9:01/26/2018] [N/A:N/A] Weeks of Treatment: [9:40] [N/A:N/A] Wound Status: [9:Open] [N/A:N/A] Measurements L x W x D [9:0.6x0.4x0.4] [N/A:N/A] (cm) Area (cm) : [9:0.188] [N/A:N/A] Volume (cm) : [9:0.075] [N/A:N/A] % Reduction in Area: [9:-164.80%] [N/A:N/A] % Reduction in Volume: [9:-435.70%] [N/A:N/A] Starting Position 1 [9:5] (o'clock): Ending Position 1 [9:10] (o'clock): Maximum Distance 1 (cm): [9:0.9] Undermining: [9:Yes] [N/A:N/A] Classification: [9:Full Thickness With Exposed Support Structures] [N/A:N/A] Exudate Amount: [9:Large] [N/A:N/A] Exudate Type: [9:Purulent] [N/A:N/A] Exudate Color: [9:yellow, brown, green] [N/A:N/A] Wound Margin: [9:Distinct, outline attached] [N/A:N/A] Granulation Amount: [9:None Present (0%)] [N/A:N/A] Necrotic Amount: [9:Large (67-100%)] [N/A:N/A] Exposed Structures: [9:Fat Layer (Subcutaneous Tissue) Exposed: Yes Fascia: No Tendon: No Muscle: No] [N/A:N/A]  Joint: No Bone: No Epithelialization: None N/A N/A Debridement: Debridement - Excisional N/A N/A Pre-procedure 12:50 N/A N/A Verification/Time Out Taken: Pain Control: Lidocaine N/A N/A Tissue Debrided: Subcutaneous N/A N/A Level: Skin/Subcutaneous Tissue N/A N/A Debridement Area (sq cm): 0.24 N/A N/A Instrument: Curette N/A N/A Bleeding: Minimum N/A N/A Hemostasis Achieved: Pressure N/A N/A Debridement Treatment Procedure was tolerated well N/A N/A Response: Post Debridement 0.6x0.4x0.4 N/A N/A Measurements L x W x D (cm) Post Debridement Volume: 0.075 N/A N/A (cm) Periwound Skin Texture: Excoriation: No N/A N/A Induration: No Callus: No Crepitus: No Rash: No Scarring: No Periwound Skin Moisture: Maceration: Yes N/A N/A Dry/Scaly: No Periwound Skin Color: Erythema: Yes N/A N/A Atrophie Blanche: No Cyanosis: No Ecchymosis: No Hemosiderin Staining: No Mottled: No Pallor: No Rubor: No Erythema  Location: Circumferential N/A N/A Temperature: No Abnormality N/A N/A Tenderness on Palpation: Yes N/A N/A Wound Preparation: Ulcer Cleansing: N/A N/A Rinsed/Irrigated with Saline Topical Anesthetic Applied: None, Other: lidocaine 4% Procedures Performed: Debridement N/A N/A Treatment Notes Electronic Signature(s) Signed: 02/06/2019 4:42:30 PM By: Baltazar Najjarobson, Michael MD Entered By: Baltazar Najjarobson, Michael on 02/06/2019 13:02:54 [name], [name] ([id number]) -------------------------------------------------------------------------------- Multi-Disciplinary Care Plan Details Patient Name: [name], [name] Date of Service: 02/06/2019 12:30 PM Medical Record Number: [id number] Patient Account Number: [id number] Date of Birth/Sex: 12/14/1929 (85 y.o. F) Treating RN: Huel CoventryWoody, Kim Primary Care De Jaworski: Aram BeechamSparks, Jeffrey Other Clinician: Referring Kenroy Timberman: Aram BeechamSparks, Jeffrey Treating Frieda Arnall/Extender: Altamese CarolinaOBSON, [name] Weeks in Treatment: 5364 Active Inactive Soft Tissue Infection Nursing Diagnoses: Impaired tissue integrity Goals: Patient's soft tissue infection will resolve Date Initiated: 07/18/2018 Target Resolution Date: 07/27/2018 Goal Status: Active Interventions: Assess signs and symptoms of infection every visit Treatment Activities: Culture and sensitivity : 07/18/2018 Systemic antibiotics : 07/18/2018 Notes: Wound/Skin Impairment Nursing Diagnoses: Impaired tissue integrity Knowledge deficit related to ulceration/compromised skin integrity Goals: Patient/caregiver will verbalize understanding of skin care regimen Date Initiated: 11/14/2017 Target Resolution Date: 11/28/2017 Goal Status: Active Ulcer/skin breakdown will have a volume reduction of 30% by week 4 Date Initiated: 11/14/2017 Target Resolution Date: 11/28/2017 Goal Status: Active Interventions: Assess patient/caregiver ability to obtain necessary supplies Assess patient/caregiver ability to perform ulcer/skin care regimen upon  admission and as needed Assess ulceration(s) every visit Treatment Activities: Skin care regimen initiated : 11/14/2017 [name], [name] ([id number]) Notes: Electronic Signature(s) Signed: 02/06/2019 5:55:39 PM By: Elliot GurneyWoody, BSN, RN, CWS, Kim RN, BSN Entered By: Elliot GurneyWoody, BSN, RN, CWS, Kim on 02/06/2019 12:50:15 [name], Lateia V. ([id number]) -------------------------------------------------------------------------------- Pain Assessment Details Patient Name: [name], [name] Date of Service: 02/06/2019 12:30 PM Medical Record Number: [id number] Patient Account Number: [id number] Date of Birth/Sex: 01/31/1930 ([age] y.o. F) Treating RN: Arnette NorrisBiell, Kristina Primary Care Woodley Petzold: Aram BeechamSparks, Jeffrey Other Clinician: Referring Elliette Seabolt: Aram BeechamSparks, Jeffrey Treating Chayne Baumgart/Extender: Altamese CarolinaOBSON, [name] Weeks in Treatment: 6464 Active Problems Location of Pain Severity and Description of Pain Patient Has Paino No Site Locations Pain Management and Medication Current Pain Management: Electronic Signature(s) Signed: 02/07/2019 7:54:32 AM By: Arnette NorrisBiell, Kristina Entered By: Arnette NorrisBiell, Kristina on 02/06/2019 12:32:40 [name], Marsella V. ([id number]) -------------------------------------------------------------------------------- Patient/Caregiver Education Details Patient Name: [name], Andreah V. Date of Service: 02/06/2019 12:30 PM Medical Record Number: [id number] Patient Account Number: [id number] Date of Birth/Gender: 08/09/1930 (85 y.o. F) Treating RN: Huel CoventryWoody, Kim Primary Care Physician: Aram BeechamSparks, Jeffrey Other Clinician: Referring Physician: Aram BeechamSparks, Jeffrey Treating Physician/Extender: Altamese CarolinaOBSON, [name] Weeks in Treatment: 1464 Education Assessment Education Provided To: Patient Education Topics Provided Wound/Skin Impairment: Handouts: Caring for Your Ulcer Methods: Demonstration, Explain/Verbal Responses: State content correctly Electronic Signature(s) Signed: 02/06/2019 5:55:39 PM By:  Elliot GurneyWoody, BSN, RN, CWS, Radio producerKim RN,  BSN Entered By: Elliot GurneyWoody, BSN, RN, CWS, Kim on 02/06/2019 12:57:44 [name], [name] ([id number]) -------------------------------------------------------------------------------- Wound Assessment Details Patient Name: [name], [name] Date of Service: 02/06/2019 12:30 PM Medical Record Number: [id number] Patient Account Number: [id number] Date of Birth/Sex: 01/12/1930 (85 y.o. F) Treating RN: Arnette NorrisBiell, Kristina Primary Care Dashiel Bergquist: Aram BeechamSparks, Jeffrey Other Clinician: Referring Shenica Holzheimer: Aram BeechamSparks, Jeffrey Treating Zonya Gudger/Extender: Altamese CarolinaOBSON, [name] Weeks in Treatment: 3564 Wound Status Wound Number: 9 Primary Open Surgical Wound Etiology: Wound Location: Left Elbow Wound Open Wounding Event: Surgical Injury Status: Date Acquired: 01/26/2018 Comorbid Cataracts, Glaucoma, Optic Neuritis, Chronic Weeks Of Treatment: 40 History: sinus problems/congestion, Middle ear problems Clustered Wound: No Photos Photo Uploaded By: Arnette NorrisBiell, Kristina on 02/06/2019 16:24:02 Wound Measurements Length: (cm) 0.6 Width: (cm) 0.4 Depth: (cm) 0.4 Area: (cm) 0.188 Volume: (cm) 0.075 % Reduction in Area: -164.8% % Reduction in Volume: -435.7% Epithelialization: None Tunneling: No Undermining: Yes Starting Position (o'clock): 5 Ending Position (o'clock): 10 Maximum Distance: (cm) 0.9 Wound Description Full Thickness With Exposed Support Classification: Structures Wound Margin: Distinct, outline attached Exudate Large Amount: Exudate Type: Purulent Exudate Color: yellow, brown, green Foul Odor After Cleansing: No Slough/Fibrino No Wound Bed Granulation Amount: None Present (0%) Exposed Structure Necrotic Amount: Large (67-100%) Fascia Exposed: No Necrotic Quality: Adherent Slough Fat Layer (Subcutaneous Tissue) Exposed: Yes [name], [name] ([id number]) Tendon Exposed: No Muscle Exposed: No Joint Exposed: No Bone Exposed: No Periwound Skin Texture Texture Color No Abnormalities Noted: No No  Abnormalities Noted: No Callus: No Atrophie Blanche: No Crepitus: No Cyanosis: No Excoriation: No Ecchymosis: No Induration: No Erythema: Yes Rash: No Erythema Location: Circumferential Scarring: No Hemosiderin Staining: No Mottled: No Moisture Pallor: No No Abnormalities Noted: No Rubor: No Dry / Scaly: No Maceration: Yes Temperature / Pain Temperature: No Abnormality Tenderness on Palpation: Yes Wound Preparation Ulcer Cleansing: Rinsed/Irrigated with Saline Topical Anesthetic Applied: None, Other: lidocaine 4%, Treatment Notes Wound #9 (Left Elbow) Notes Endoform, border foam dressing Electronic Signature(s) Signed: 02/07/2019 7:54:32 AM By: Arnette NorrisBiell, Kristina Entered By: Arnette NorrisBiell, Kristina on 02/06/2019 12:44:10 [name], [name] ([id number]) -------------------------------------------------------------------------------- Vitals Details Patient Name: [name], [name] Date of Service: 02/06/2019 12:30 PM Medical Record Number: [id number] Patient Account Number: [id number] Date of Birth/Sex: 12/24/1929 (85 y.o. F) Treating RN: Arnette NorrisBiell, Kristina Primary Care Rafaela Dinius: Aram BeechamSparks, Jeffrey Other Clinician: Referring Sahmya Arai: Aram BeechamSparks, Jeffrey Treating Carden Teel/Extender: Altamese CarolinaOBSON, [name] Weeks in Treatment: 5264 Vital Signs Time Taken: 12:32 Temperature (F): 97.9 Height (in): 60 Pulse (bpm): 62 Weight (lbs): 123 Respiratory Rate (breaths/min): 18 Body Mass Index (BMI): 24 Blood Pressure (mmHg): 147/63 Reference Range: 80 - 120 mg / dl Electronic Signature(s) Signed: 02/07/2019 7:54:32 AM By: Arnette NorrisBiell, Kristina Entered By: Arnette NorrisBiell, Kristina on 02/06/2019 12:35:53

## 2019-02-12 ENCOUNTER — Ambulatory Visit: Payer: Medicare Other | Attending: Neurology

## 2019-02-12 DIAGNOSIS — M6281 Muscle weakness (generalized): Secondary | ICD-10-CM

## 2019-02-12 DIAGNOSIS — Z9181 History of falling: Secondary | ICD-10-CM | POA: Insufficient documentation

## 2019-02-12 DIAGNOSIS — R2681 Unsteadiness on feet: Secondary | ICD-10-CM | POA: Diagnosis not present

## 2019-02-12 DIAGNOSIS — M545 Low back pain: Secondary | ICD-10-CM | POA: Diagnosis present

## 2019-02-12 DIAGNOSIS — R262 Difficulty in walking, not elsewhere classified: Secondary | ICD-10-CM | POA: Diagnosis present

## 2019-02-12 DIAGNOSIS — G8929 Other chronic pain: Secondary | ICD-10-CM | POA: Insufficient documentation

## 2019-02-12 NOTE — Therapy (Signed)
[facility] [address] [place], [place], [address] Phone: [phone]   Fax:  [phone]  Physical Therapy Treatment  Patient Details  Name: Natasha BassetJanice V Barnett MRN: [id number] Date of Birth: 02/26/1930 Referring Provider (PT): Cristopher PeruHemang Shah, MD   Encounter Date: 02/12/2019  PT End of Session - 02/12/19 1127    Visit Number  32    Number of Visits  62    Date for PT Re-Evaluation  03/28/19    Authorization Type  2    Authorization Time Period  of 10 progress report    PT Start Time  1128   pt arrived late   PT Stop Time  1200    PT Time Calculation (min)  32 min    Equipment Utilized During Treatment  Gait belt    Activity Tolerance  Patient tolerated treatment well    Behavior During Therapy  WFL for tasks assessed/performed       Past Medical History:  Diagnosis Date  . Anemia   . GERD (gastroesophageal reflux disease)   . Hypertension   . Peripheral vascular disease (HCC)    possible neuropathies in lower extremeties  . PONV (postoperative nausea and vomiting)    happens sometimes but better with pre med of zofran  . Presence of permanent cardiac pacemaker   . Syncope     Past Surgical History:  Procedure Laterality Date  . ABDOMINAL HYSTERECTOMY  1969  . APPENDECTOMY  1969   with hysterectomy  . BACK SURGERY  2001   rods in back. surgery on back x 3  . COLONOSCOPY    . I&D EXTREMITY Left 03/27/2018   Procedure: IRRIGATION AND DEBRIDEMENT LEFT ELBOW / OLECRANON BURSA;  Surgeon: Christena FlakePoggi, John J, MD;  Location: [facility] ORS;  Service: Orthopedics;  Laterality: Left;  . I&D EXTREMITY Left 06/05/2018   Procedure: IRRIGATION AND DEBRIDEMENT EXTREMITY;  Surgeon: Christena FlakePoggi, John J, MD;  Location: [facility] ORS;  Service: Orthopedics;  Laterality: Left;  . INSERT / REPLACE / REMOVE PACEMAKER  2004  . JOINT REPLACEMENT Left 2004   partial knee replacement  . OLECRANON BURSECTOMY Left 03/27/2018   Procedure: LEFT OLECRANON  BURSA;  Surgeon: Christena FlakePoggi, John J, MD;  Location: [facility] ORS;  Service: Orthopedics;  Laterality: Left;  . PACEMAKER INSERTION  2014   dual chamber for complete heart block    There were no vitals filed for this visit.  Subjective Assessment - 02/12/19 1130    Subjective  Pt states that she does not do well when she rushes. Feels slower when she rushes.     Pertinent History  Imparied gait. Pt states that she has had balance issues for too long. Feels more comfortable walking with her rw. Had tests which showed that she has neuropathy. [facility] is coming from her back. Just wants to be strong so she can walk.  Started walking with her rw off and on for the past several months.  Also feels like she might need a new sleep number bed. Also had 2 wound surgeries at [name]. Pt fell in February 2019. Had to stop PT due to the L elbow surgery April 06, 2018 to remove a bursa. The wound still has not healed.   Currently sees a wound doctor.   Feels numbness L foot from the ankle down.  No R foot numbness.  Sometimes unsure of where her L LE is in  space.  Pt also states that she tires too quickly.  Has not been able to walk her dog in 2 years. Has not really been doing exercises at home except the sit <> stand. Pt states that prolonged sitting without back support will increase back pain     Limitations  Standing;Walking    How long can you sit comfortably?  1 hr    How long can you stand comfortably?  10 minutes    How long can you walk comfortably?  2 or 3 blocks    Diagnostic tests  EMG testing BLEs and BUEs which were negative    Patient Stated Goals  Improve LE strength. Wants to use the machines at the gym. Walk more steadily. Be able to take her dog (16 lbs) out on a leash.     Currently in Pain?  Other (Comment)    Pain Score  --   no complain of pain   Pain Onset  More than a month ago                               Objectives  Pt ambulating with rw  TTP bilateral greater trochanter L >R   L elbow wound  No latex band allergies    MedbridgeAccess Code: [email or web address]   Therapeutic exercise   Seated resisted trunk extension isometrics at neutral 10x5 seconds for 3 sets  Standing hip abduction with B UE assist 3 lbs ankle weight             R 5x3             L 5x3        Leg press seat 2,  B LE plate 45 for 41P3  Performed to promote LE strengthening (glute and quad) as well as for weight bearing exercise to help promote bone density.   Side stepping 32 ftto the R, and 32 ftto the L mod A and hand held assist to promote glute med muscle strength and balance.   Improved exercise technique, movement at target joints, use of target muscles aftermin tomod verbal, visual, tactile cues.      Gait   GaitforwardCGA. Hand held assist (min A) to no UE assist. Cues for increased hip and knee flexion  60 ft  Then 200 ft. LOB x 1 when pt did not pick her foot up as high and 2nd LOB when pt was reaching towards the chair at the end.   Stepping over 6 mini hurdles with hand held assist, min to mod A   Improved technique, movement at target joints, use of target muscles after min to mod verbal, visual, tactile cues.     Response to treatment Good muscle use felt with exercises. Slight B knee discomfort with leg press exercise.   Clinical impression Continued working on trunk, glute, and quad strengthening to promote ability to perform standing tasks as well as ambulate with less difficulty. Slight difficulty with gait with hand held assist with LOB x 2 today. Otherwise, pt able to perform hand held assist exercises with CGA to mod A without problems. Pt will benefit from continued skilled physical therapy services to improve strength and function.        PT  Education - 02/12/19 1132    Education provided  Yes    Education Details  ther-ex    [employer]) Educated  Patient    Methods  Explanation;Demonstration;Tactile cues;Verbal cues  Comprehension  Returned demonstration;Verbalized understanding       PT Short Term Goals - 01/28/19 1929      PT SHORT TERM GOAL #1   Title  Patient will be independent with her HEP to improve strength and balance.     Time  3    Period  Weeks    Status  On-going    Target Date  02/21/19        PT Long Term Goals - 02/04/19 1228      PT LONG TERM GOAL #1   Title  Patient will improve her 10 MWT to 0.8 m/s or more with rw to promote better community ambulation.     Baseline  0.5 seconds with rw (09/06/2018); 0.63 seconds average with rw (10/18/2018); 0.69 m/s average using rw (12/10/2018); 0.59 m/s using SPC and CGA (01/08/2019); 0.60 m/s using SPC (01/21/2019); 0.56 m/s with SPC to no AD assist, CGA (02/04/2019)    Time  8    Period  Weeks    Status  On-going    Target Date  03/28/19      PT LONG TERM GOAL #2   Title  Patient will improve her DGI score using rw or least restrictive AD to 19/24 or more to promote balance.     Baseline  13/24 using rw (09/06/2018); 14/24 using rw (10/29/2018); 16/24 (12/10/2018); 14/24 using SPC (01/21/2019)    Time  8    Period  Weeks    Status  On-going    Target Date  03/28/19      PT LONG TERM GOAL #3   Title  Patient will improve bilateral LE strength by at least 1/2 MMT to promote ability to support herself when performing standing tasks and improve balance.     Time  8    Period  Weeks    Status  On-going    Target Date  03/28/19      PT LONG TERM GOAL #4   Title  Patient will have a decrease in back pain/ache to 5/10 or less to promote ability to perform standing tasks with less difficulty.    Baseline  8/10 at most for the past 3 months (09/06/2018); 5-6/10 at worst for the past 7 days (10/18/2018); 5/10 at worst for the past 7 days (12/10/2018)    Time  6     Period  Weeks    Status  Achieved            Plan - 02/12/19 1133    Clinical Impression Statement  Continued working on trunk, glute, and quad strengthening to promote ability to perform standing tasks as well as ambulate with less difficulty. Slight difficulty with gait with hand held assist with LOB x 2 today. Otherwise, pt able to perform hand held assist exercises with CGA to mod A without problems. Pt will benefit from continued skilled physical therapy services to improve strength and function.     Rehab Potential  Fair    Clinical Impairments Affecting Rehab Potential  (-) chronicity of condition, age, comorbidities; (+) motivated    PT Frequency  2x / week    PT Duration  8 weeks    PT Treatment/Interventions  Aquatic Therapy;Gait training;Therapeutic activities;Therapeutic exercise;Balance training;Neuromuscular re-education;Patient/family education;Manual techniques;Dry needling    PT Next Visit Plan  trunk, hip, scapular strengthening, balance, gait, manual techniques PRN    Consulted and Agree with Plan of Care  Patient       Patient will benefit from skilled  therapeutic intervention in order to improve the following deficits and impairments:  Abnormal gait, Decreased activity tolerance, Decreased balance, Decreased endurance, Decreased range of motion, Decreased strength, Difficulty walking, Improper body mechanics, Postural dysfunction, Pain, Decreased knowledge of use of DME  Visit Diagnosis: Unsteadiness on feet  Difficulty in walking, not elsewhere classified  Muscle weakness (generalized)  History of falling     Problem List Patient Active Problem List   Diagnosis Date Noted  . Septic olecranon bursitis of left elbow 03/27/2018  . Lymphedema 10/11/2017  . Chronic venous insufficiency 10/11/2017  . Leg pain 10/11/2017  . Leg swelling 10/11/2017  . COPD (chronic obstructive pulmonary disease) (HCC) 10/11/2017  . Essential hypertension 10/11/2017    Loralyn Freshwater PT, DPT   02/12/2019, 12:54 PM  [facility] PHYSICAL AND SPORTS MEDICINE [address] [place], [address] Phone: [phone]   Fax:  [phone]  Name: [name] MRN: [id number] Date of Birth: November 02, 1930

## 2019-02-13 ENCOUNTER — Encounter: Payer: Medicare Other | Attending: Internal Medicine | Admitting: Internal Medicine

## 2019-02-13 DIAGNOSIS — L97905 Non-pressure chronic ulcer of unspecified part of unspecified lower leg with muscle involvement without evidence of necrosis: Secondary | ICD-10-CM | POA: Diagnosis present

## 2019-02-13 DIAGNOSIS — S51002A Unspecified open wound of left elbow, initial encounter: Secondary | ICD-10-CM | POA: Insufficient documentation

## 2019-02-13 DIAGNOSIS — M069 Rheumatoid arthritis, unspecified: Secondary | ICD-10-CM | POA: Diagnosis not present

## 2019-02-13 DIAGNOSIS — Z885 Allergy status to narcotic agent status: Secondary | ICD-10-CM | POA: Diagnosis not present

## 2019-02-13 DIAGNOSIS — G5602 Carpal tunnel syndrome, left upper limb: Secondary | ICD-10-CM | POA: Diagnosis not present

## 2019-02-13 DIAGNOSIS — I10 Essential (primary) hypertension: Secondary | ICD-10-CM | POA: Insufficient documentation

## 2019-02-13 DIAGNOSIS — Z950 Presence of cardiac pacemaker: Secondary | ICD-10-CM | POA: Diagnosis not present

## 2019-02-13 DIAGNOSIS — Z881 Allergy status to other antibiotic agents status: Secondary | ICD-10-CM | POA: Diagnosis not present

## 2019-02-13 DIAGNOSIS — L98495 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: Secondary | ICD-10-CM | POA: Insufficient documentation

## 2019-02-13 DIAGNOSIS — M351 Other overlap syndromes: Secondary | ICD-10-CM | POA: Diagnosis not present

## 2019-02-13 DIAGNOSIS — M419 Scoliosis, unspecified: Secondary | ICD-10-CM | POA: Diagnosis not present

## 2019-02-13 DIAGNOSIS — Z880 Allergy status to penicillin: Secondary | ICD-10-CM | POA: Insufficient documentation

## 2019-02-14 ENCOUNTER — Ambulatory Visit: Payer: Medicare Other

## 2019-02-14 DIAGNOSIS — R262 Difficulty in walking, not elsewhere classified: Secondary | ICD-10-CM

## 2019-02-14 DIAGNOSIS — M6281 Muscle weakness (generalized): Secondary | ICD-10-CM

## 2019-02-14 DIAGNOSIS — Z9181 History of falling: Secondary | ICD-10-CM

## 2019-02-14 DIAGNOSIS — R2681 Unsteadiness on feet: Secondary | ICD-10-CM | POA: Diagnosis not present

## 2019-02-14 NOTE — Therapy (Signed)
[facility] PH[facility]ICINE [address] [place], [place], [address] Phone: [phone]   Fax:  [phone]  Physical Therapy Treatment  Patient Details  Name: Natasha BassetJanice V Bowery MRN: [id number] Date of Birth: 08/23/1930 Referring Provider (PT): Cristopher PeruHemang Shah, MD   Encounter Date: 02/14/2019  PT End of Session - 02/14/19 1122    Visit Number  33    Number of Visits  62    Date for PT Re-Evaluation  03/28/19    Authorization Type  3    Authorization Time Period  of 10 progress report    PT Start Time  1122    PT Stop Time  1202    PT Time Calculation (min)  40 min    Equipment Utilized During Treatment  Gait belt    Activity Tolerance  Patient tolerated treatment well    Behavior During Therapy  WFL for tasks assessed/performed       Past Medical History:  Diagnosis Date  . Anemia   . GERD (gastroesophageal reflux disease)   . Hypertension   . Peripheral vascular disease (HCC)    possible neuropathies in lower extremeties  . PONV (postoperative nausea and vomiting)    happens sometimes but better with pre med of zofran  . Presence of permanent cardiac pacemaker   . Syncope     Past Surgical History:  Procedure Laterality Date  . ABDOMINAL HYSTERECTOMY  1969  . APPENDECTOMY  1969   with hysterectomy  . BACK SURGERY  2001   rods in back. surgery on back x 3  . COLONOSCOPY    . I&D EXTREMITY Left 03/27/2018   Procedure: IRRIGATION AND DEBRIDEMENT LEFT ELBOW / OLECRANON BURSA;  Surgeon: Christena FlakePoggi, John J, MD;  Location: [facility];  Service: Orthopedics;  Laterality: Left;  . I&D EXTREMITY Left 06/05/2018   Procedure: IRRIGATION AND DEBRIDEMENT EXTREMITY;  Surgeon: Christena FlakePoggi, John J, MD;  Location: [facility];  Service: Orthopedics;  Laterality: Left;  . INSERT / REPLACE / REMOVE PACEMAKER  2004  . JOINT REPLACEMENT Left 2004   partial knee replacement  . OLECRANON BURSECTOMY Left 03/27/2018   Procedure: LEFT OLECRANON BURSA;  Surgeon:  Christena FlakePoggi, John J, MD;  Location: [facility];  Service: Orthopedics;  Laterality: Left;  . PACEMAKER INSERTION  2014   dual chamber for complete heart block    There were no vitals filed for this visit.  Subjective Assessment - 02/14/19 1123    Subjective  Pt states that she is doing ok. Mornings are not her best times.     Pertinent History  Imparied gait. Pt states that she has had balance issues for too long. Feels more comfortable walking with her rw. Had tests which showed that she has neuropathy. [facility] her [facility] is coming from her back. Just wants to be strong so she can walk.  Started walking with her rw off and on for the past several months.  Also feels like she might need a new sleep number bed. Also had 2 wound surgeries at [name]. Pt fell in February 2019. Had to stop PT due to the L elbow surgery April 06, 2018 to remove a bursa. The wound still has not healed.   Currently sees a wound doctor.   Feels numbness L foot from the ankle down.  No R foot numbness.  Sometimes unsure of where her L LE is in space.  Pt also states that she  tires too quickly.  Has not been able to walk her dog in 2 years. Has not really been doing exercises at home except the sit <> stand. Pt states that prolonged sitting without back support will increase back pain     Limitations  Standing;Walking    How long can you sit comfortably?  1 hr    How long can you stand comfortably?  10 minutes    How long can you walk comfortably?  2 or 3 blocks    Diagnostic tests  EMG testing BLEs and BUEs which were negative    Patient Stated Goals  Improve LE strength. Wants to use the machines at the gym. Walk more steadily. Be able to take her dog (16 lbs) out on a leash.     Currently in Pain?  Yes   R shoulder pain   Pain Score  --   no number provided   Pain Onset  More than a month ago                               PT Education - 02/14/19 1149     Education provided  Yes    Education Details  ther-ex. Gait    Person(s) Educated  Patient    Methods  Explanation;Demonstration;Tactile cues;Verbal cues    Comprehension  Returned demonstration;Verbalized understanding       Objectives  Pt ambulating with rw  TTP bilateral greater trochanter L >R   L elbow wound  No latex band allergies    MedbridgeAccess Code: [email or web address]   Gait  Gait with SPC 200 ft with CGA initially but SBA the majority of the time. Pt able to perform well until pt sees people walking nearby causing her to stop or slightly lose balance. Able to recover independently.   Gait with SPC with emphasis on increased gait speed 32 ft x3  CGA  Pt unable to increase gait speed observed.    Improved exercise technique, movement at target joints, use of target muscles after mod verbal, visual, tactile cues.     Therapeutic exercise   Seated resisted trunk extension isometrics at neutral 10x5 seconds for 3 sets  Leg press seat 2,  B LE plate 45 for 54T6  Performed to promote LE strengthening (glute and quad) as well as for weight bearing exercise to help promote bone density   Standing hip abduction with B UE assist 3 lbs ankle weight R 5x3 L 5x3 . standing mini squats without UE assist 5x5 seconds, then 10x5 seconds  Stepping over a shoe box with one UE assist at treadmill bar 5x each LE  Then without UE assist. Unable to perform without UE assist  Standing marches with one UE assist 10x each LE   Then 2 finger assist 5x each LE  Then no UE assist 2x5 each LE  SLS with one UE assist   R 5x5 seconds for 2 sets  L 5x5 seconds for 2 sets   Improved exercise technique, movement at target joints, use of target muscles aftermin tomod verbal, visual, tactile cues.    Response to treatment Good muscle use felt with exercises. Slight B knee discomfort with  leg press exercise.    Continued working on LE strengthening to improve balance as well as ability to ambulate with less difficulty. Pt able to step over a shoe box obstacle with one UE assist but unable to perform without UE assist. Worked  on SLS exercises to help address and promote glute med strengthening. Pt also able to ambulate with SPC with SBA and no LOB until pt sees people which causes her to stop or slightly lose balance in which fear of falling may play a factor. Pt will benefit from continued skilled physical therapy services to improve strength, balance, and function.      PT Short Term Goals - 01/28/19 1929      PT SHORT TERM GOAL #1   Title  Patient will be independent with her HEP to improve strength and balance.     Time  3    Period  Weeks    Status  On-going    Target Date  02/21/19        PT Long Term Goals - 02/04/19 1228      PT LONG TERM GOAL #1   Title  Patient will improve her 10 MWT to 0.8 m/s or more with rw to promote better community ambulation.     Baseline  0.5 seconds with rw (09/06/2018); 0.63 seconds average with rw (10/18/2018); 0.69 m/s average using rw (12/10/2018); 0.59 m/s using SPC and CGA (01/08/2019); 0.60 m/s using SPC (01/21/2019); 0.56 m/s with SPC to no AD assist, CGA (02/04/2019)    Time  8    Period  Weeks    Status  On-going    Target Date  03/28/19      PT LONG TERM GOAL #2   Title  Patient will improve her DGI score using rw or least restrictive AD to 19/24 or more to promote balance.     Baseline  13/24 using rw (09/06/2018); 14/24 using rw (10/29/2018); 16/24 (12/10/2018); 14/24 using SPC (01/21/2019)    Time  8    Period  Weeks    Status  On-going    Target Date  03/28/19      PT LONG TERM GOAL #3   Title  Patient will improve bilateral LE strength by at least 1/2 MMT to promote ability to support herself when performing standing tasks and improve balance.     Time  8    Period  Weeks    Status  On-going    Target Date   03/28/19      PT LONG TERM GOAL #4   Title  Patient will have a decrease in back pain/ache to 5/10 or less to promote ability to perform standing tasks with less difficulty.    Baseline  8/10 at most for the past 3 months (09/06/2018); 5-6/10 at worst for the past 7 days (10/18/2018); 5/10 at worst for the past 7 days (12/10/2018)    Time  6    Period  Weeks    Status  Achieved            Plan - 02/14/19 1219    Clinical Impression Statement  Continued working on LE strengthening to improve balance as well as ability to ambulate with less difficulty. Pt able to step over a shoe box obstacle with one UE assist but unable to perform without UE assist. Worked on SLS exercises to help address and promote glute med strengthening. Pt also able to ambulate with SPC with SBA and no LOB until pt sees people which causes her to stop or slightly lose balance in which fear of falling may play a factor. Pt will benefit from continued skilled physical therapy services to improve strength, balance, and function.     Rehab Potential  Fair  Clinical Impairments Affecting Rehab Potential  (-) chronicity of condition, age, comorbidities; (+) motivated    PT Frequency  2x / week    PT Duration  8 weeks    PT Treatment/Interventions  Aquatic Therapy;Gait training;Therapeutic activities;Therapeutic exercise;Balance training;Neuromuscular re-education;Patient/family education;Manual techniques;Dry needling    PT Next Visit Plan  trunk, hip, scapular strengthening, balance, gait, manual techniques PRN    Consulted and Agree with Plan of Care  Patient       Patient will benefit from skilled therapeutic intervention in order to improve the following deficits and impairments:  Abnormal gait, Decreased activity tolerance, Decreased balance, Decreased endurance, Decreased range of motion, Decreased strength, Difficulty walking, Improper body mechanics, Postural dysfunction, Pain, Decreased knowledge of use of  DME  Visit Diagnosis: Unsteadiness on feet  Difficulty in walking, not elsewhere classified  Muscle weakness (generalized)  History of falling     Problem List Patient Active Problem List   Diagnosis Date Noted  . Septic olecranon bursitis of left elbow 03/27/2018  . Lymphedema 10/11/2017  . Chronic venous insufficiency 10/11/2017  . Leg pain 10/11/2017  . Leg swelling 10/11/2017  . COPD (chronic obstructive pulmonary disease) (HCC) 10/11/2017  . Essential hypertension 10/11/2017    Loralyn Freshwater PT, DPT   02/14/2019, 12:31 PM  [facility] PHYS[facility] AND SPORTS MEDICINE [address], [place], [address] Phone: [phone]   Fax:  [phone]  Name: SHANTAI SHOBE MRN: [id number] Date of Birth: [birth year]
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
## 2019-02-17 NOTE — Progress Notes (Signed)
[name], [name] ([id number]) Visit Report for 02/13/2019 Arrival Information Details Patient Name: [name], [name]. Date of Service: 02/13/2019 11:00 AM Medical Record Number: [id number] Patient Account Number: [id number] Date of Birth/Sex: 1930-07-18 (85 y.o. F) Treating RN: Arnette Norris Primary Care Suhani Stillion: Aram Beecham Other Clinician: Referring Jerry Haugen: Aram Beecham Treating Haidyn Kilburg/Extender: Altamese Co[place] in Treatment: 11 Visit Information History Since Last Visit Added or deleted any medications: No Patient Arrived: Walker Any new allergies or adverse reactions: No Arrival Time: 11:12 Had a fall or experienced change in No Accompanied By: self activities of daily living that may affect Transfer Assistance: None risk of falls: Patient Identification Verified: Yes Signs or symptoms of abuse/neglect since last visito No Secondary Verification Process Yes Hospitalized since last visit: No Completed: Has Dressing in Place as Prescribed: Yes Patient Requires Transmission-Based No Pain Present Now: No Precautions: Patient Has Alerts: Yes Patient Alerts: non compressible left leg Electronic Signature(s) Signed: 02/15/2019 3:21:09 PM By: Arnette Norris Entered By: Arnette Norris on 02/13/2019 11:14:49 [name] ([id number]) -------------------------------------------------------------------------------- Clinic Level of Care Assessment Details Patient Name: [name] Date of Service: 02/13/2019 11:00 AM Medical Record Number: [id number] Patient Account Number: [id number] Date of Birth/Sex: September 20, 1930 (85 y.o. F) Treating RN: Huel Coventry Primary Care Aliana Kreischer: Aram Beecham Other Clinician: Referring Jalayla Chrismer: Aram Beecham Treating Chealsea Paske/Extender: Altamese Riggins in Treatment: 57 Clinic Level of Care Assessment Items TOOL 4 Quantity Score []  - Use when only an EandM is performed on FOLLOW-UP visit 0 ASSESSMENTS - Nursing  Assessment / Reassessment []  - Reassessment of Co-morbidities (includes updates in patient status) 0 X- 1 5 Reassessment of Adherence to Treatment Plan ASSESSMENTS - Wound and Skin Assessment / Reassessment []  - Simple Wound Assessment / Reassessment - one wound 0 X- 1 5 Complex Wound Assessment / Reassessment - multiple wounds []  - 0 Dermatologic / Skin Assessment (not related to wound area) ASSESSMENTS - Focused Assessment []  - Circumferential Edema Measurements - multi extremities 0 []  - 0 Nutritional Assessment / Counseling / Intervention []  - 0 Lower Extremity Assessment (monofilament, tuning fork, pulses) []  - 0 Peripheral Arterial Disease Assessment (using hand held doppler) ASSESSMENTS - Ostomy and/or Continence Assessment and Care []  - Incontinence Assessment and Management 0 []  - 0 Ostomy Care Assessment and Management (repouching, etc.) PROCESS - Coordination of Care X - Simple Patient / Family Education for ongoing care 1 15 []  - 0 Complex (extensive) Patient / Family Education for ongoing care []  - 0 Staff obtains Chiropractor, Records, Test Results / Process Orders []  - 0 Staff telephones HHA, Nursing Homes / Clarify orders / etc []  - 0 Routine Transfer to another Facility (non-emergent condition) []  - 0 Routine Hospital Admission (non-emergent condition) []  - 0 New Admissions / Manufacturing engineer / Ordering NPWT, Apligraf, etc. []  - 0 Emergency Hospital Admission (emergent condition) X- 1 10 Simple Discharge Coordination [name], [name] ([id number]) []  - 0 Complex (extensive) Discharge Coordination PROCESS - Special Needs []  - Pediatric / Minor Patient Management 0 []  - 0 Isolation Patient Management []  - 0 Hearing / Language / Visual special needs []  - 0 Assessment of Community assistance (transportation, D/C planning, etc.) []  - 0 Additional assistance / Altered mentation []  - 0 Support Surface(s) Assessment (bed, cushion, seat,  etc.) INTERVENTIONS - Wound Cleansing / Measurement X - Simple Wound Cleansing - one wound 1 5 []  - 0 Complex Wound Cleansing - multiple wounds X- 1 5 Wound Imaging (photographs - any number of wounds) []  -  0 Wound Tracing (instead of photographs) X- 1 5 Simple Wound Measurement - one wound []  - 0 Complex Wound Measurement - multiple wounds INTERVENTIONS - Wound Dressings []  - Small Wound Dressing one or multiple wounds 0 X- 1 15 Medium Wound Dressing one or multiple wounds []  - 0 Large Wound Dressing one or multiple wounds []  - 0 Application of Medications - topical []  - 0 Application of Medications - injection INTERVENTIONS - Miscellaneous []  - External ear exam 0 []  - 0 Specimen Collection (cultures, biopsies, blood, body fluids, etc.) []  - 0 Specimen(s) / Culture(s) sent or taken to Lab for analysis []  - 0 Patient Transfer (multiple staff / Nurse, adultHoyer Lift / Similar devices) []  - 0 Simple Staple / Suture removal (25 or less) []  - 0 Complex Staple / Suture removal (26 or more) []  - 0 Hypo / Hyperglycemic Management (close monitor of Blood Glucose) []  - 0 Ankle / Brachial Index (ABI) - do not check if billed separately X- 1 5 Vital Signs [name], [name] ([id number]) Has the patient been seen at the hospital within the last three years: Yes Total Score: 70 Level Of Care: New/Established - Level 2 Electronic Signature(s) Signed: 02/13/2019 5:29:19 PM By: Elliot GurneyWoody, BSN, RN, CWS, Kim RN, BSN Entered By: Elliot GurneyWoody, BSN, RN, CWS, Kim on 02/13/2019 12:49:55 [name], Charmane V. ([id number]) -------------------------------------------------------------------------------- Encounter Discharge Information Details Patient Name: [name], [name] Date of Service: 02/13/2019 11:00 AM Medical Record Number: [id number] Patient Account Number: [id number] Date of Birth/Sex: 11/17/1930 (85 y.o. F) Treating RN: Arnette NorrisBiell, Kristina Primary Care Bruin Bolger: Aram BeechamSparks, Jeffrey Other Clinician: Referring Airanna Partin:  Aram BeechamSparks, Jeffrey Treating Le Ferraz/Extender: Altamese CarolinaOBSON, [name] Weeks in Treatment: 3465 Encounter Discharge Information Items Discharge Condition: Stable Ambulatory Status: Walker Discharge Destination: Home Transportation: Private Auto Accompanied By: self Schedule Follow-up Appointment: Yes Clinical Summary of Care: Electronic Signature(s) Signed: 02/15/2019 3:21:09 PM By: Arnette NorrisBiell, Kristina Entered By: Arnette NorrisBiell, Kristina on 02/13/2019 11:47:50 [name], Nandi V. ([id number]) -------------------------------------------------------------------------------- Lower Extremity Assessment Details Patient Name: [name], [name] Date of Service: 02/13/2019 11:00 AM Medical Record Number: [id number] Patient Account Number: [id number] Date of Birth/Sex: 07/31/1930 (85 y.o. F) Treating RN: Arnette NorrisBiell, Kristina Primary Care Mayte Diers: Aram BeechamSparks, Jeffrey Other Clinician: Referring Latiya Navia: Aram BeechamSparks, Jeffrey Treating Tahnee Cifuentes/Extender: Maxwell CaulOBSON, [name] Weeks in Treatment: 4865 Electronic Signature(s) Signed: 02/15/2019 3:21:09 PM By: Arnette NorrisBiell, Kristina Entered By: Arnette NorrisBiell, Kristina on 02/13/2019 11:22:38 [name], Shawnise V. ([id number]) -------------------------------------------------------------------------------- Multi Wound Chart Details Patient Name: [name], [name] Date of Service: 02/13/2019 11:00 AM Medical Record Number: [id number] Patient Account Number: [id number] Date of Birth/Sex: 10/27/1930 (85 y.o. F) Treating RN: Huel CoventryWoody, Kim Primary Care Dyami Umbach: Aram BeechamSparks, Jeffrey Other Clinician: Referring Maysun Meditz: Aram BeechamSparks, Jeffrey Treating Kalanie Fewell/Extender: Altamese CarolinaOBSON, [name] Weeks in Treatment: 6265 Vital Signs Height(in): 60 Pulse(bpm): 67 Weight(lbs): 123 Blood Pressure(mmHg): 140/62 Body Mass Index(BMI): 24 Temperature(F): 97.8 Respiratory Rate 16 (breaths/min): Photos: [N/A:N/A] Wound Location: Left Elbow N/A N/A Wounding Event: Surgical Injury N/A N/A Primary Etiology: Open Surgical Wound N/A N/A Comorbid  History: Cataracts, Glaucoma, Optic N/A N/A Neuritis, Chronic sinus problems/congestion, Middle ear problems Date Acquired: 01/26/2018 N/A N/A Weeks of Treatment: 41 N/A N/A Wound Status: Open N/A N/A Measurements L x W x D 0.7x0.4x0.3 N/A N/A (cm) Area (cm) : 0.22 N/A N/A Volume (cm) : 0.066 N/A N/A % Reduction in Area: -209.90% N/A N/A % Reduction in Volume: -371.40% N/A N/A Starting Position 1 5 (o'clock): Ending Position 1 12 (o'clock): Maximum Distance 1 (cm): 0.4 Undermining: Yes N/A N/A Classification: Full Thickness With Exposed N/A N/A Support Structures  Exudate Amount: Large N/A N/A Exudate Type: Purulent N/A N/A Exudate Color: yellow, brown, green N/A N/A Wound Margin: Distinct, outline attached N/A N/A Granulation Amount: None Present (0%) N/A N/A [name], [name] ([id number]) Necrotic Amount: Large (67-100%) N/A N/A Exposed Structures: Fat Layer (Subcutaneous N/A N/A Tissue) Exposed: Yes Fascia: No Tendon: No Muscle: No Joint: No Bone: No Epithelialization: None N/A N/A Periwound Skin Texture: Excoriation: No N/A N/A Induration: No Callus: No Crepitus: No Rash: No Scarring: No Periwound Skin Moisture: Maceration: Yes N/A N/A Dry/Scaly: No Periwound Skin Color: Erythema: Yes N/A N/A Atrophie Blanche: No Cyanosis: No Ecchymosis: No Hemosiderin Staining: No Mottled: No Pallor: No Rubor: No Erythema Location: Circumferential N/A N/A Temperature: No Abnormality N/A N/A Tenderness on Palpation: Yes N/A N/A Wound Preparation: Ulcer Cleansing: N/A N/A Rinsed/Irrigated with Saline Topical Anesthetic Applied: None, Other: lidocaine 4% Treatment Notes Wound #9 (Left Elbow) Notes Endoform, border foam dressing Electronic Signature(s) Signed: 02/13/2019 5:05:37 PM By: Baltazar Najjarobson, Michael MD Entered By: Baltazar Najjarobson, Michael on 02/13/2019 12:27:18 [name], Genell V.  ([id number]) -------------------------------------------------------------------------------- Multi-Disciplinary Care Plan Details Patient Name: [name], Seylah V. Date of Service: 02/13/2019 11:00 AM Medical Record Number: [id number] Patient Account Number: [id number] Date of Birth/Sex: 04/24/1930 (85 y.o. F) Treating RN: Huel CoventryWoody, Kim Primary Care Yvetta Drotar: Aram BeechamSparks, Jeffrey Other Clinician: Referring Ovie Cornelio: Aram BeechamSparks, Jeffrey Treating Garima Chronis/Extender: Altamese CarolinaOBSON, [name] Weeks in Treatment: 3165 Active Inactive Soft Tissue Infection Nursing Diagnoses: Impaired tissue integrity Goals: Patient's soft tissue infection will resolve Date Initiated: 07/18/2018 Target Resolution Date: 07/27/2018 Goal Status: Active Interventions: Assess signs and symptoms of infection every visit Treatment Activities: Culture and sensitivity : 07/18/2018 Systemic antibiotics : 07/18/2018 Notes: Wound/Skin Impairment Nursing Diagnoses: Impaired tissue integrity Knowledge deficit related to ulceration/compromised skin integrity Goals: Patient/caregiver will verbalize understanding of skin care regimen Date Initiated: 11/14/2017 Target Resolution Date: 11/28/2017 Goal Status: Active Ulcer/skin breakdown will have a volume reduction of 30% by week 4 Date Initiated: 11/14/2017 Target Resolution Date: 11/28/2017 Goal Status: Active Interventions: Assess patient/caregiver ability to obtain necessary supplies Assess patient/caregiver ability to perform ulcer/skin care regimen upon admission and as needed Assess ulceration(s) every visit Treatment Activities: Skin care regimen initiated : 11/14/2017 [name], [name] ([id number]) Notes: Electronic Signature(s) Signed: 02/13/2019 5:29:19 PM By: Elliot GurneyWoody, BSN, RN, CWS, Kim RN, BSN Entered By: Elliot GurneyWoody, BSN, RN, CWS, Kim on 02/13/2019 11:31:14 [name], Adelie V. ([id number]) -------------------------------------------------------------------------------- Pain Assessment  Details Patient Name: [name], Jeffery V. Date of Service: 02/13/2019 11:00 AM Medical Record Number: [id number] Patient Account Number: [id number] Date of Birth/Sex: 12/05/1930 (85 y.o. F) Treating RN: Arnette NorrisBiell, Kristina Primary Care Drey Shaff: Aram BeechamSparks, Jeffrey Other Clinician: Referring Tametria Aho: Aram BeechamSparks, Jeffrey Treating Hadriel Northup/Extender: Altamese CarolinaOBSON, [name] Weeks in Treatment: 2665 Active Problems Location of Pain Severity and Description of Pain Patient Has Paino No Site Locations Pain Management and Medication Current Pain Management: Electronic Signature(s) Signed: 02/15/2019 3:21:09 PM By: Arnette NorrisBiell, Kristina Entered By: Arnette NorrisBiell, Kristina on 02/13/2019 11:15:25 [name], Uri V. ([id number]) -------------------------------------------------------------------------------- Patient/Caregiver Education Details Patient Name: [name], Murlene V. Date of Service: 02/13/2019 11:00 AM Medical Record Number: [id number] Patient Account Number: [id number] Date of Birth/Gender: 06/20/1930 (85 y.o. F) Treating RN: Arnette NorrisBiell, Kristina Primary Care Physician: Aram BeechamSparks, Jeffrey Other Clinician: Referring Physician: Aram BeechamSparks, Jeffrey Treating Physician/Extender: Altamese CarolinaOBSON, [name] Weeks in Treatment: 2565 Education Assessment Education Provided To: Patient Education Topics Provided Wound/Skin Impairment: Handouts: Other: wound care as ordered Methods: Demonstration, Explain/Verbal Responses: State content correctly Electronic Signature(s) Signed: 02/15/2019 3:21:09 PM By: Arnette NorrisBiell, Kristina Entered By: Arnette NorrisBiell, Kristina on 02/13/2019 11:48:22 [name], Jatavia V. ([id number]) --------------------------------------------------------------------------------  Wound Assessment Details Patient Name: CHRISTY, EHRSAM. Date of Service: 02/13/2019 11:00 AM Medical Record Number: [id number] Patient Account Number: [id number] Date of Birth/Sex: January 21, 1930 (85 y.o. F) Treating RN: Arnette Norris Primary Care Abryanna Musolino: Aram Beecham Other  Clinician: Referring Amily Depp: Aram Beecham Treating Melaina Howerton/Extender: Altamese [place] in Treatment: 65 Wound Status Wound Number: 9 Primary Open Surgical Wound Etiology: Wound Location: Left Elbow Wound Open Wounding Event: Surgical Injury Status: Date Acquired: 01/26/2018 Comorbid Cataracts, Glaucoma, Optic Neuritis, Chronic Weeks Of Treatment: 41 History: sinus problems/congestion, Middle ear problems Clustered Wound: No Photos Wound Measurements Length: (cm) 0.7 Width: (cm) 0.4 Depth: (cm) 0.3 Area: (cm) 0.22 Volume: (cm) 0.066 % Reduction in Area: -209.9% % Reduction in Volume: -371.4% Epithelialization: None Undermining: Yes Starting Position (o'clock): 5 Ending Position (o'clock): 12 Maximum Distance: (cm) 0.4 Wound Description Full Thickness With Exposed Support Foul Odo Classification: Structures Slough/F Wound Margin: Distinct, outline attached Exudate Large Amount: Exudate Type: Purulent Exudate Color: yellow, brown, green r After Cleansing: No ibrino No Wound Bed Granulation Amount: None Present (0%) Exposed Structure Necrotic Amount: Large (67-100%) Fascia Exposed: No Necrotic Quality: Adherent Slough Fat Layer (Subcutaneous Tissue) Exposed: Yes Tendon Exposed: No Muscle Exposed: No AMADEA, KEAGY ([id number]) Joint Exposed: No Bone Exposed: No Periwound Skin Texture Texture Color No Abnormalities Noted: No No Abnormalities Noted: No Callus: No Atrophie Blanche: No Crepitus: No Cyanosis: No Excoriation: No Ecchymosis: No Induration: No Erythema: Yes Rash: No Erythema Location: Circumferential Scarring: No Hemosiderin Staining: No Mottled: No Moisture Pallor: No No Abnormalities Noted: No Rubor: No Dry / Scaly: No Maceration: Yes Temperature / Pain Temperature: No Abnormality Tenderness on Palpation: Yes Wound Preparation Ulcer Cleansing: Rinsed/Irrigated with Saline Topical Anesthetic Applied: None, Other:  lidocaine 4%, Treatment Notes Wound #9 (Left Elbow) Notes Endoform, border foam dressing Electronic Signature(s) Signed: 02/15/2019 3:21:09 PM By: Arnette Norris Entered By: Arnette Norris on 02/13/2019 11:22:24 [name] ([id number]) -------------------------------------------------------------------------------- Vitals Details Patient Name: [name] Date of Service: 02/13/2019 11:00 AM Medical Record Number: [id number] Patient Account Number: [id number] Date of Birth/Sex: 1930-08-29 (85 y.o. F) Treating RN: Arnette Norris Primary Care Malinda Mayden: Aram Beecham Other Clinician: Referring Therisa Mennella: Aram Beecham Treating [place] Tarnowski/Extender: Altamese [place] in Treatment: 53 Vital Signs Time Taken: 11:15 Temperature (F): 97.8 Height (in): 60 Pulse (bpm): 67 Weight (lbs): 123 Respiratory Rate (breaths/min): 16 Body Mass Index (BMI): 24 Blood Pressure (mmHg): 140/62 Reference Range: 80 - 120 mg / dl Electronic Signature(s) Signed: 02/15/2019 3:21:09 PM By: Arnette Norris Entered By: Arnette Norris on 02/13/2019 11:18:19

## 2019-02-18 NOTE — Progress Notes (Signed)
[name], [name] ([id number]) Visit Report for 01/09/2019 Arrival Information Details Patient Name: [name], [name] Date of Service: 01/09/2019 11:00 AM Medical Record Number: [id number] Patient Account Number: [id number] Date of Birth/Sex: 10/12/1930 (85 y.o. F) Treating RN: Huel CoventryWoody, Kim Primary Care Noelle Hoogland: Aram BeechamSparks, Jeffrey Other Clinician: Referring Antonyo Hinderer: Aram BeechamSparks, Jeffrey Treating Laksh Hinners/Extender: Altamese CarolinaOBSON, [name] Weeks in Treatment: 60 Visit Information History Since Last Visit Added or deleted any medications: No Patient Arrived: Walker Any new allergies or adverse reactions: No Arrival Time: 11:07 Had a fall or experienced change in No Accompanied By: self activities of daily living that may affect Transfer Assistance: None risk of falls: Patient Identification Verified: Yes Signs or symptoms of abuse/neglect since last visito No Secondary Verification Process Yes Hospitalized since last visit: No Completed: Implantable device outside of the clinic excluding No Patient Requires Transmission-Based No cellular tissue based products placed in the center Precautions: since last visit: Patient Has Alerts: Yes Has Dressing in Place as Prescribed: Yes Patient Alerts: non compressible left Pain Present Now: No leg Electronic Signature(s) Signed: 01/09/2019 11:46:38 AM By: Dayton MartesWallace, RCP,RRT,CHT, Sallie RCP, RRT, CHT Entered By: Dayton MartesWallace, RCP,RRT,CHT, Sallie on 01/09/2019 11:10:30 [name], [name] ([id number]) -------------------------------------------------------------------------------- Encounter Discharge Information Details Patient Name: [name], [name] Date of Service: 01/09/2019 11:00 AM Medical Record Number: [id number] Patient Account Number: [id number] Date of Birth/Sex: 07/15/1930 (85 y.o. F) Treating RN: Huel CoventryWoody, Kim Primary Care Marranda Arakelian: Aram BeechamSparks, Jeffrey Other Clinician: Referring Maricela Schreur: Aram BeechamSparks, Jeffrey Treating Halden Phegley/Extender: Altamese CarolinaOBSON, [name] Weeks in  Treatment: 3560 Encounter Discharge Information Items Post Procedure Vitals Discharge Condition: Stable Temperature (F): 97.5 Ambulatory Status: Walker Pulse (bpm): 67 Discharge Destination: Home Respiratory Rate (breaths/min): 16 Transportation: Private Auto Blood Pressure (mmHg): 117/57 Accompanied By: self Schedule Follow-up Appointment: No Clinical Summary of Care: Electronic Signature(s) Signed: 01/09/2019 6:00:27 PM By: Elliot GurneyWoody, BSN, RN, CWS, Kim RN, BSN Entered By: Elliot GurneyWoody, BSN, RN, CWS, Kim on 01/09/2019 11:49:42 [name], [name] ([id number]) -------------------------------------------------------------------------------- Lower Extremity Assessment Details Patient Name: [name], [name] Date of Service: 01/09/2019 11:00 AM Medical Record Number: [id number] Patient Account Number: [id number] Date of Birth/Sex: 04/28/1930 (85 y.o. F) Treating RN: Arnette NorrisBiell, Kristina Primary Care Kethan Papadopoulos: Aram BeechamSparks, Jeffrey Other Clinician: Referring Gennie Dib: Aram BeechamSparks, Jeffrey Treating Desmund Elman/Extender: Maxwell CaulOBSON, [name] Weeks in Treatment: 60 Electronic Signature(s) Signed: 02/18/2019 10:53:34 AM By: Arnette NorrisBiell, Kristina Entered By: Arnette NorrisBiell, Kristina on 01/09/2019 11:29:07 [name], [name] ([id number]) -------------------------------------------------------------------------------- Multi Wound Chart Details Patient Name: [name], [name] Date of Service: 01/09/2019 11:00 AM Medical Record Number: [id number] Patient Account Number: [id number] Date of Birth/Sex: 02/18/1930 (85 y.o. F) Treating RN: Huel CoventryWoody, Kim Primary Care Angenette Daily: Aram BeechamSparks, Jeffrey Other Clinician: Referring Janny Crute: Aram BeechamSparks, Jeffrey Treating Jerimie Mancuso/Extender: Altamese CarolinaOBSON, [name] Weeks in Treatment: 60 Vital Signs Height(in): 60 Pulse(bpm): 67 Weight(lbs): 123 Blood Pressure(mmHg): 117/57 Body Mass Index(BMI): 24 Temperature(F): 97.5 Respiratory Rate 18 (breaths/min): Photos: [9:No Photos] [N/A:N/A] Wound Location: [9:Left Elbow]  [N/A:N/A] Wounding Event: [9:Surgical Injury] [N/A:N/A] Primary Etiology: [9:Open Surgical Wound] [N/A:N/A] Comorbid History: [9:Cataracts, Glaucoma, Optic Neuritis, Chronic sinus problems/congestion, Middle ear problems] [N/A:N/A] Date Acquired: [9:01/26/2018] [N/A:N/A] Weeks of Treatment: [9:36] [N/A:N/A] Wound Status: [9:Open] [N/A:N/A] Measurements L x W x D [9:0.8x0.5x0.2] [N/A:N/A] (cm) Area (cm) : [9:0.314] [N/A:N/A] Volume (cm) : [9:0.063] [N/A:N/A] % Reduction in Area: [9:-342.30%] [N/A:N/A] % Reduction in Volume: [9:-350.00%] [N/A:N/A] Starting Position 1 [9:1] (o'clock): Ending Position 1 [9:12] (o'clock): Maximum Distance 1 (cm): [9:0.9] Undermining: [9:Yes] [N/A:N/A] Classification: [9:Full Thickness With Exposed Support Structures] [N/A:N/A] Exudate Amount: [9:Medium] [N/A:N/A] Exudate Type: [9:Purulent] [N/A:N/A] Exudate Color: [9:yellow, brown, green] [N/A:N/A]  Wound Margin: [9:Distinct, outline attached] [N/A:N/A] Granulation Amount: [9:None Present (0%)] [N/A:N/A] Necrotic Amount: [9:Medium (34-66%)] [N/A:N/A] Exposed Structures: [9:Fat Layer (Subcutaneous Tissue) Exposed: Yes Fascia: No Tendon: No Muscle: No] [N/A:N/A] Joint: No Bone: No Epithelialization: None N/A N/A Debridement: Debridement - Excisional N/A N/A Pre-procedure 11:40 N/A N/A Verification/Time Out Taken: Pain Control: Other N/A N/A Tissue Debrided: Subcutaneous, Slough N/A N/A Level: Skin/Subcutaneous Tissue N/A N/A Debridement Area (sq cm): 0.4 N/A N/A Instrument: Curette N/A N/A Bleeding: Minimum N/A N/A Hemostasis Achieved: Pressure N/A N/A Debridement Treatment Procedure was tolerated well N/A N/A Response: Post Debridement 0.8x0.5x0.2 N/A N/A Measurements L x W x D (cm) Post Debridement Volume: 0.063 N/A N/A (cm) Periwound Skin Texture: Excoriation: No N/A N/A Induration: No Callus: No Crepitus: No Rash: No Scarring: No Periwound Skin Moisture: Maceration: Yes N/A  N/A Dry/Scaly: No Periwound Skin Color: Erythema: Yes N/A N/A Atrophie Blanche: No Cyanosis: No Ecchymosis: No Hemosiderin Staining: No Mottled: No Pallor: No Rubor: No Erythema Location: Circumferential N/A N/A Temperature: No Abnormality N/A N/A Tenderness on Palpation: Yes N/A N/A Wound Preparation: Ulcer Cleansing: N/A N/A Rinsed/Irrigated with Saline Topical Anesthetic Applied: Other: lidocaine 4% Procedures Performed: Cellular or Tissue Based N/A N/A Product Debridement Treatment Notes Wound #9 (Left Elbow) Notes PuraPly, steri strips, mepitel, gauze bolster and border foam dressing Electronic Signature(s) [name], Helga V. ([id number]) Signed: 01/10/2019 9:49:40 AM By: Baltazar Najjarobson, Michael MD Entered By: Baltazar Najjarobson, Michael on 01/09/2019 11:59:06 [name], Raiza V. ([id number]) -------------------------------------------------------------------------------- Multi-Disciplinary Care Plan Details Patient Name: [name], [name] Date of Service: 01/09/2019 11:00 AM Medical Record Number: [id number] Patient Account Number: [id number] Date of Birth/Sex: 11/10/1930 (85 y.o. F) Treating RN: Huel CoventryWoody, Kim Primary Care Dalayla Aldredge: Aram BeechamSparks, Jeffrey Other Clinician: Referring Helem Reesor: Aram BeechamSparks, Jeffrey Treating Timarie Labell/Extender: Altamese CarolinaOBSON, [name] Weeks in Treatment: 8960 Active Inactive Soft Tissue Infection Nursing Diagnoses: Impaired tissue integrity Goals: Patient's soft tissue infection will resolve Date Initiated: 07/18/2018 Target Resolution Date: 07/27/2018 Goal Status: Active Interventions: Assess signs and symptoms of infection every visit Treatment Activities: Culture and sensitivity : 07/18/2018 Systemic antibiotics : 07/18/2018 Notes: Wound/Skin Impairment Nursing Diagnoses: Impaired tissue integrity Knowledge deficit related to ulceration/compromised skin integrity Goals: Patient/caregiver will verbalize understanding of skin care regimen Date Initiated: 11/14/2017 Target  Resolution Date: 11/28/2017 Goal Status: Active Ulcer/skin breakdown will have a volume reduction of 30% by week 4 Date Initiated: 11/14/2017 Target Resolution Date: 11/28/2017 Goal Status: Active Interventions: Assess patient/caregiver ability to obtain necessary supplies Assess patient/caregiver ability to perform ulcer/skin care regimen upon admission and as needed Assess ulceration(s) every visit Treatment Activities: Skin care regimen initiated : 11/14/2017 [name], Lillianne V. ([id number]) Notes: Electronic Signature(s) Signed: 01/09/2019 6:00:27 PM By: Elliot GurneyWoody, BSN, RN, CWS, Kim RN, BSN Entered By: Elliot GurneyWoody, BSN, RN, CWS, Kim on 01/09/2019 11:38:07 [name], Kora V. ([id number]) -------------------------------------------------------------------------------- Pain Assessment Details Patient Name: [name], Remie V. Date of Service: 01/09/2019 11:00 AM Medical Record Number: [id number] Patient Account Number: [id number] Date of Birth/Sex: 05/28/1930 (85 y.o. F) Treating RN: Huel CoventryWoody, Kim Primary Care Mars Scheaffer: Aram BeechamSparks, Jeffrey Other Clinician: Referring Breyon Blass: Aram BeechamSparks, Jeffrey Treating Deema Juncaj/Extender: Altamese CarolinaOBSON, [name] Weeks in Treatment: 4760 Active Problems Location of Pain Severity and Description of Pain Patient Has Paino No Site Locations Pain Management and Medication Current Pain Management: Electronic Signature(s) Signed: 01/09/2019 11:46:38 AM By: Dayton MartesWallace, RCP,RRT,CHT, Sallie RCP, RRT, CHT Signed: 01/09/2019 6:00:27 PM By: Elliot GurneyWoody, BSN, RN, CWS, Kim RN, BSN Entered By: Dayton MartesWallace, RCP,RRT,CHT, Sallie on 01/09/2019 11:10:43 [name], Aleeya V. ([id number]) -------------------------------------------------------------------------------- Patient/Caregiver Education Details Patient Name: [name], Terica V. Date of  Service: 01/09/2019 11:00 AM Medical Record Number: [id number] Patient Account Number: [id number] Date of Birth/Gender: 1930-11-08 (85 y.o. F) Treating RN: Huel Coventry Primary Care  Physician: Aram Beecham Other Clinician: Referring Physician: Aram Beecham Treating Physician/Extender: Altamese [place] in Treatment: 1 Education Assessment Education Provided To: Patient Education Topics Provided Wound/Skin Impairment: Handouts: Caring for Your Ulcer Methods: Demonstration, Explain/Verbal Responses: State content correctly Electronic Signature(s) Signed: 01/09/2019 6:00:27 PM By: Elliot Gurney, BSN, RN, CWS, Kim RN, BSN Entered By: Elliot Gurney, BSN, RN, CWS, Kim on 01/09/2019 11:48:46 Natasha Basset ([id number]) -------------------------------------------------------------------------------- Wound Assessment Details Patient Name: Natasha Basset Date of Service: 01/09/2019 11:00 AM Medical Record Number: [id number] Patient Account Number: [id number] Date of Birth/Sex: Aug 05, 1930 (85 y.o. F) Treating RN: Arnette Norris Primary Care Zilda No: Aram Beecham Other Clinician: Referring Mazey Mantell: Aram Beecham Treating Kindra Bickham/Extender: Altamese [place] in Treatment: 60 Wound Status Wound Number: 9 Primary Open Surgical Wound Etiology: Wound Location: Left Elbow Wound Open Wounding Event: Surgical Injury Status: Date Acquired: 01/26/2018 Comorbid Cataracts, Glaucoma, Optic Neuritis, Chronic Weeks Of Treatment: 36 History: sinus problems/congestion, Middle ear problems Clustered Wound: No Photos Photo Uploaded By: Arnette Norris on 01/09/2019 17:21:39 Wound Measurements Length: (cm) 0.8 Width: (cm) 0.5 Depth: (cm) 0.2 Area: (cm) 0.314 Volume: (cm) 0.063 % Reduction in Area: -342.3% % Reduction in Volume: -350% Epithelialization: None Tunneling: No Undermining: Yes Starting Position (o'clock): 1 Ending Position (o'clock): 12 Maximum Distance: (cm) 0.9 Wound Description Full Thickness With Exposed Support Classification: Structures Wound Margin: Distinct, outline attached Exudate Medium Amount: Exudate Type: Purulent Exudate  Color: yellow, brown, green Foul Odor After Cleansing: No Slough/Fibrino No Wound Bed Granulation Amount: None Present (0%) Exposed Structure Necrotic Amount: Medium (34-66%) Fascia Exposed: No Necrotic Quality: Adherent Slough Fat Layer (Subcutaneous Tissue) Exposed: Yes ALIZIA, JANKOVIC ([id number]) Tendon Exposed: No Muscle Exposed: No Joint Exposed: No Bone Exposed: No Periwound Skin Texture Texture Color No Abnormalities Noted: No No Abnormalities Noted: No Callus: No Atrophie Blanche: No Crepitus: No Cyanosis: No Excoriation: No Ecchymosis: No Induration: No Erythema: Yes Rash: No Erythema Location: Circumferential Scarring: No Hemosiderin Staining: No Mottled: No Moisture Pallor: No No Abnormalities Noted: No Rubor: No Dry / Scaly: No Maceration: Yes Temperature / Pain Temperature: No Abnormality Tenderness on Palpation: Yes Wound Preparation Ulcer Cleansing: Rinsed/Irrigated with Saline Topical Anesthetic Applied: Other: lidocaine 4%, Electronic Signature(s) Signed: 02/18/2019 10:53:34 AM By: Arnette Norris Entered By: Arnette Norris on 01/09/2019 11:28:03 Natasha Basset ([id number]) -------------------------------------------------------------------------------- Vitals Details Patient Name: Natasha Basset Date of Service: 01/09/2019 11:00 AM Medical Record Number: [id number] Patient Account Number: [id number] Date of Birth/Sex: 1930/12/08 (85 y.o. F) Treating RN: Huel Coventry Primary Care Keondra Haydu: Aram Beecham Other Clinician: Referring Shalyn Koral: Aram Beecham Treating Arwin Bisceglia/Extender: Altamese [place] in Treatment: 60 Vital Signs Time Taken: 11:11 Temperature (F): 97.5 Height (in): 60 Pulse (bpm): 67 Weight (lbs): 123 Respiratory Rate (breaths/min): 18 Body Mass Index (BMI): 24 Blood Pressure (mmHg): 117/57 Reference Range: 80 - 120 mg / dl Electronic Signature(s) Signed: 01/09/2019 11:46:38 AM By: Dayton Martes RCP, RRT, CHT Entered By: Dayton Martes on 01/09/2019 11:15:22

## 2019-02-19 ENCOUNTER — Ambulatory Visit: Payer: Medicare Other

## 2019-02-19 DIAGNOSIS — M6281 Muscle weakness (generalized): Secondary | ICD-10-CM

## 2019-02-19 DIAGNOSIS — R2681 Unsteadiness on feet: Secondary | ICD-10-CM

## 2019-02-19 DIAGNOSIS — Z9181 History of falling: Secondary | ICD-10-CM

## 2019-02-19 DIAGNOSIS — M545 Low back pain: Secondary | ICD-10-CM

## 2019-02-19 DIAGNOSIS — R262 Difficulty in walking, not elsewhere classified: Secondary | ICD-10-CM

## 2019-02-19 DIAGNOSIS — G8929 Other chronic pain: Secondary | ICD-10-CM

## 2019-02-19 NOTE — Therapy (Signed)
[facility] PH[facility]ICINE [address], [place], [address] Phone: [phone]   Fax:  [phone]  Physical Therapy Treatment  Patient Details  Name: [name] MRN: [id number] Date of Birth: 05-Nov-1930 Referring Provider (PT): Cristopher Peru, MD   Encounter Date: 02/19/2019  PT End of Session - 02/19/19 1123    Visit Number  34    Number of Visits  62    Date for PT Re-Evaluation  03/28/19    Authorization Type  4    Authorization Time Period  of 10 progress report    PT Start Time  1123    PT Stop Time  1204    PT Time Calculation (min)  41 min    Equipment Utilized During Treatment  Gait belt    Activity Tolerance  Patient tolerated treatment well    Behavior During Therapy  WFL for tasks assessed/performed       Past Medical History:  Diagnosis Date  . Anemia   . GERD (gastroesophageal reflux disease)   . Hypertension   . Peripheral vascular disease (HCC)    possible neuropathies in lower extremeties  . PONV (postoperative nausea and vomiting)    happens sometimes but better with pre med of zofran  . Presence of permanent cardiac pacemaker   . Syncope     Past Surgical History:  Procedure Laterality Date  . ABDOMINAL HYSTERECTOMY  1969  . APPENDECTOMY  1969   with hysterectomy  . BACK SURGERY  2001   rods in back. surgery on back x 3  . COLONOSCOPY    . I&D EXTREMITY Left 03/27/2018   Procedure: IRRIGATION AND DEBRIDEMENT LEFT ELBOW / OLECRANON BURSA;  Surgeon: Christena Flake, MD;  Location: [facility];  Service: Orthopedics;  Laterality: Left;  . I&D EXTREMITY Left 06/05/2018   Procedure: IRRIGATION AND DEBRIDEMENT EXTREMITY;  Surgeon: Christena Flake, MD;  Location: [facility];  Service: Orthopedics;  Laterality: Left;  . INSERT / REPLACE / REMOVE PACEMAKER  2004  . JOINT REPLACEMENT Left 2004   partial knee replacement  . OLECRANON BURSECTOMY Left 03/27/2018   Procedure: LEFT OLECRANON BURSA;  Surgeon:  Christena Flake, MD;  Location: [facility];  Service: Orthopedics;  Laterality: Left;  . PACEMAKER INSERTION  2014   dual chamber for complete heart block    There were no vitals filed for this visit.  Subjective Assessment - 02/19/19 1125    Subjective  Has not been a good morning. Jittery, and not as sure footed as she wants to be. Feels like she has too much on her plate outside of PT.     Pertinent History  Imparied gait. Pt states that she has had balance issues for too long. Feels more comfortable walking with her rw. Had tests which showed that she has neuropathy. However the doctor at [facility] said that her balance is coming from her back. Just wants to be strong so she can walk.  Started walking with her rw off and on for the past several months.  Also feels like she might need a new sleep number bed. Also had 2 wound surgeries at [name]. Pt fell in February 2019. Had to stop PT due to the L elbow surgery April 06, 2018 to remove a bursa. The wound still has not healed.   Currently sees a wound doctor.   Feels numbness L foot from the ankle down.  No R foot numbness.  Sometimes unsure of where her L LE is in space.  Pt also states that she tires too quickly.  Has not been able to walk her dog in 2 years. Has not really been doing exercises at home except the sit <> stand. Pt states that prolonged sitting without back support will increase back pain     Limitations  Standing;Walking    How long can you sit comfortably?  1 hr    How long can you stand comfortably?  10 minutes    How long can you walk comfortably?  2 or 3 blocks    Diagnostic tests  EMG testing BLEs and BUEs which were negative    Patient Stated Goals  Improve LE strength. Wants to use the machines at the gym. Walk more steadily. Be able to take her dog (16 lbs) out on a leash.     Currently in Pain?  No/denies    Pain Score  --   no complain of pain.    Pain Onset  More than a month ago                                PT Education - 02/19/19 1129    Education provided  Yes    Education Details  ther-ex    Starwood HotelsPerson(s) Educated  Patient    Methods  Explanation;Tactile cues;Demonstration;Verbal cues    Comprehension  Returned demonstration;Verbalized understanding       Objectives  Pt ambulating with rw  TTP bilateral greater trochanter L >R   L elbow wound  No latex band allergies    MedbridgeAccess Code: [email or web address]   Therapeutic exercise   Seated resisted trunk extension isometrics at neutral 10x5 seconds for 3 sets  Standing hip abduction with B UE assist 3 lbs ankle weight R 10x3 L 10x3  Leg press seat 2,  B LE plate 45 for [id number]   Then plate 45 and a 5 lbs dumbbell 5x2 (upgrade)  Performed to promote LE strengthening (glute and quad) as well as for weight bearing exercise to help promote bone density  standing mini squats without UE assist 5x5 seconds  Then holding 2 lbs each hand 4 lbs total 5x5 seconds  Static mini running man with one UE assist  R 10x2  L 10x2  SLS with one UE assist to promote glute med muscle strengthening             R 5x5 seconds for 3 sets             L 5x5 seconds for 3 sets   (upgraded number of sets)  Standing marches with one UE assist 10x each LE              Then 2 finger assist 5x each LE             Then with light touch to no UE assist 2x5 each LE  Improved exercise technique, movement at target joints, use of target muscles after mod verbal, visual, tactile cues.        Response to treatment Good muscle use felt with exercises. Pt tolerated session well without aggravation of symptoms.   Clinical Impression Worked on strengthening today predominantly, especially in weight bearing to promote stronger bones as well as to promote ability to perform standing tasks with less difficulty and improve  balance. Pt tolerated session well without aggravation of symptoms. Upgraded level of difficulty with  some exercises to challenge muscles. Pt will benefit from continued skilled physical therapy services to improve strength, balance, and function.       PT Short Term Goals - 01/28/19 1929      PT SHORT TERM GOAL #1   Title  Patient will be independent with her HEP to improve strength and balance.     Time  3    Period  Weeks    Status  On-going    Target Date  02/21/19        PT Long Term Goals - 02/04/19 1228      PT LONG TERM GOAL #1   Title  Patient will improve her 10 MWT to 0.8 m/s or more with rw to promote better community ambulation.     Baseline  0.5 seconds with rw (09/06/2018); 0.63 seconds average with rw (10/18/2018); 0.69 m/s average using rw (12/10/2018); 0.59 m/s using SPC and CGA (01/08/2019); 0.60 m/s using SPC (01/21/2019); 0.56 m/s with SPC to no AD assist, CGA (02/04/2019)    Time  8    Period  Weeks    Status  On-going    Target Date  03/28/19      PT LONG TERM GOAL #2   Title  Patient will improve her DGI score using rw or least restrictive AD to 19/24 or more to promote balance.     Baseline  13/24 using rw (09/06/2018); 14/24 using rw (10/29/2018); 16/24 (12/10/2018); 14/24 using SPC (01/21/2019)    Time  8    Period  Weeks    Status  On-going    Target Date  03/28/19      PT LONG TERM GOAL #3   Title  Patient will improve bilateral LE strength by at least 1/2 MMT to promote ability to support herself when performing standing tasks and improve balance.     Time  8    Period  Weeks    Status  On-going    Target Date  03/28/19      PT LONG TERM GOAL #4   Title  Patient will have a decrease in back pain/ache to 5/10 or less to promote ability to perform standing tasks with less difficulty.    Baseline  8/10 at most for the past 3 months (09/06/2018); 5-6/10 at worst for the past 7 days (10/18/2018); 5/10 at worst for the past 7 days (12/10/2018)    Time  6     Period  Weeks    Status  Achieved            Plan - 02/19/19 1310    Clinical Impression Statement  Worked on strengthening today predominantly, especially in weight bearing to promote stronger bones as well as to promote ability to perform standing tasks with less difficulty and improve balance. Pt tolerated session well without aggravation of symptoms. Upgraded level of difficulty with some exercises to challenge muscles. Pt will benefit from continued skilled physical therapy services to improve strength, balance, and function.     Rehab Potential  Fair    Clinical Impairments Affecting Rehab Potential  (-) chronicity of condition, age, comorbidities; (+) motivated    PT Frequency  2x / week    PT Duration  8 weeks    PT Treatment/Interventions  Aquatic Therapy;Gait training;Therapeutic activities;Therapeutic exercise;Balance training;Neuromuscular re-education;Patient/family education;Manual techniques;Dry needling    PT Next Visit Plan  trunk, hip, scapular strengthening, balance, gait, manual techniques PRN    Consulted and Agree with Plan of Care  Patient       Patient will benefit from skilled therapeutic intervention in order to improve the following deficits and impairments:  Abnormal gait, Decreased activity tolerance, Decreased balance, Decreased endurance, Decreased range of motion, Decreased strength, Difficulty walking, Improper body mechanics, Postural dysfunction, Pain, Decreased knowledge of use of DME  Visit Diagnosis: Unsteadiness on feet  Difficulty in walking, not elsewhere classified  Muscle weakness (generalized)  History of falling  Chronic low back pain, unspecified back pain laterality, unspecified whether sciatica present     Problem List Patient Active Problem List   Diagnosis Date Noted  . Septic olecranon bursitis of left elbow 03/27/2018  . Lymphedema 10/11/2017  . Chronic venous insufficiency 10/11/2017  . Leg pain 10/11/2017  . Leg  swelling 10/11/2017  . COPD (chronic obstructive pulmonary disease) (HCC) 10/11/2017  . Essential hypertension 10/11/2017    Loralyn Freshwater PT, DPT   02/19/2019, 1:21 PM  [place] [facility] PHYS[facility] AND SPORTS MEDICINE [address], [place], [address] Phone: [phone]   Fax:  [phone]  Name: [name] MRN: [id number] Date of Birth: November 29, 1930

## 2019-02-20 ENCOUNTER — Encounter: Payer: Medicare Other | Admitting: Internal Medicine

## 2019-02-20 ENCOUNTER — Other Ambulatory Visit: Payer: Self-pay

## 2019-02-20 DIAGNOSIS — L97905 Non-pressure chronic ulcer of unspecified part of unspecified lower leg with muscle involvement without evidence of necrosis: Secondary | ICD-10-CM | POA: Diagnosis not present

## 2019-02-21 ENCOUNTER — Ambulatory Visit: Payer: Medicare Other

## 2019-02-21 DIAGNOSIS — R262 Difficulty in walking, not elsewhere classified: Secondary | ICD-10-CM

## 2019-02-21 DIAGNOSIS — M545 Low back pain: Secondary | ICD-10-CM

## 2019-02-21 DIAGNOSIS — M6281 Muscle weakness (generalized): Secondary | ICD-10-CM

## 2019-02-21 DIAGNOSIS — R2681 Unsteadiness on feet: Secondary | ICD-10-CM

## 2019-02-21 DIAGNOSIS — Z9181 History of falling: Secondary | ICD-10-CM

## 2019-02-21 DIAGNOSIS — G8929 Other chronic pain: Secondary | ICD-10-CM
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
## 2019-02-21 NOTE — Therapy (Signed)
[facility] PH[facility]ORTS MEDICINE [address] [place], [address] Phone: [phone]   Fax:  [phone]  Physical Therapy Treatment  Patient Details  Name: [name] MRN: [id number] Date of Birth: 1930-01-02 Referring Provider (PT): Cristopher Peru, MD   Encounter Date: 02/21/2019  PT End of Session - 02/21/19 1122    Visit Number  35    Number of Visits  62    Date for PT Re-Evaluation  03/28/19    Authorization Type  5    Authorization Time Period  of 10 progress report    PT Start Time  1122    PT Stop Time  1209    PT Time Calculation (min)  47 min    Equipment Utilized During Treatment  Gait belt    Activity Tolerance  Patient tolerated treatment well    Behavior During Therapy  WFL for tasks assessed/performed       Past Medical History:  Diagnosis Date  . Anemia   . GERD (gastroesophageal reflux disease)   . Hypertension   . Peripheral vascular disease (HCC)    possible neuropathies in lower extremeties  . PONV (postoperative nausea and vomiting)    happens sometimes but better with pre med of zofran  . Presence of permanent cardiac pacemaker   . Syncope     Past Surgical History:  Procedure Laterality Date  . ABDOMINAL HYSTERECTOMY  1969  . APPENDECTOMY  1969   with hysterectomy  . BACK SURGERY  2001   rods in back. surgery on back x 3  . COLONOSCOPY    . I&D EXTREMITY Left 03/27/2018   Procedure: IRRIGATION AND DEBRIDEMENT LEFT ELBOW / OLECRANON BURSA;  Surgeon: Christena Flake, MD;  Location: [facility];  Service: Orthopedics;  Laterality: Left;  . I&D EXTREMITY Left 06/05/2018   Procedure: IRRIGATION AND DEBRIDEMENT EXTREMITY;  Surgeon: Christena Flake, MD;  Location: [facility];  Service: Orthopedics;  Laterality: Left;  . INSERT / REPLACE / REMOVE PACEMAKER  2004  . JOINT REPLACEMENT Left 2004   partial knee replacement  . OLECRANON BURSECTOMY Left 03/27/2018   Procedure: LEFT OLECRANON BURSA;  Surgeon:  Christena Flake, MD;  Location: [facility];  Service: Orthopedics;  Laterality: Left;  . PACEMAKER INSERTION  2014   dual chamber for complete heart block    There were no vitals filed for this visit.  Subjective Assessment - 02/21/19 1125    Subjective  Doing ok. No pain currently. L knee bothered her Tuesday afterwards, not during the session. Had a partial L knee replacement. Wants to do more strengthening.     Pertinent History  Imparied gait. Pt states that she has had balance issues for too long. Feels more comfortable walking with her rw. Had tests which showed that she has neuropathy. However the doctor at [facility] said that her balance is coming from her back. Just wants to be strong so she can walk.  Started walking with her rw off and on for the past several months.  Also feels like she might need a new sleep number bed. Also had 2 wound surgeries at [name]. Pt fell in February 2019. Had to stop PT due to the L elbow surgery April 06, 2018 to remove a bursa. The wound still has not healed.   Currently sees a wound doctor.   Feels numbness L foot from the ankle down.  No R foot numbness.  Sometimes unsure of  where her L LE is in space.  Pt also states that she tires too quickly.  Has not been able to walk her dog in 2 years. Has not really been doing exercises at home except the sit <> stand. Pt states that prolonged sitting without back support will increase back pain     Limitations  Standing;Walking    How long can you sit comfortably?  1 hr    How long can you stand comfortably?  10 minutes    How long can you walk comfortably?  2 or 3 blocks    Diagnostic tests  EMG testing BLEs and BUEs which were negative    Patient Stated Goals  Improve LE strength. Wants to use the machines at the gym. Walk more steadily. Be able to take her dog (16 lbs) out on a leash.     Currently in Pain?  No/denies    Pain Score  0-No pain    Pain Onset  More than a month ago                                PT Education - 02/21/19 1130    Education provided  Yes    Education Details  ther-ex    [employer]) Educated  Patient    Methods  Explanation;Demonstration;Tactile cues;Verbal cues    Comprehension  Returned demonstration;Verbalized understanding       Objectives  Pt ambulating with rw  TTP bilateral greater trochanter L >R   L elbow wound  No latex band allergies    MedbridgeAccess Code: [email or web address]   Gait  Gait with SPC 160 ft CGA. Pt states feeling dizzy as she made a turn.   Blood pressure R arm sitting, normal cuff, mechanically taken:150/59, HR 63 Dizziness subsided with rest  Gait with SPC, CGA 150 ft. No dizziness, no LOB  Improved technique, movement at target joints, use of target muscles after min to mod verbal, visual, tactile cues.     Therapeutic exercise   Seated resisted trunk extension isometrics at neutral 10x5 seconds for 3 sets  Standing hip abduction with B UE assist 3 lbs ankle weight R 10x3 L 10x3  Static mini running man with one UE assist to promote glute muscle strengthening              R 10x2             L 10x2  BP R arm sitting, mechanically taken, normal cuff: 141/54, HR 65   Leg press seat 2,  B LE plate 45 for 16X0, then 4x                         Then plate 45 and a 5 lbs dumbbell 5x2  Performed to promote LE strengthening (glute and quad) as well as for weight bearing exercise to help promote bone density   SLS with one UE assist to promote glute med muscle strengthening R 5x5 seconds for 3 sets L 5x5 seconds for 3 sets   Improved exercise technique, movement at target joints, use of target muscles after min to mod verbal, visual, tactile cues.    Response to treatment Good muscle use felt with exercises. Pt tolerated session well without aggravation of  symptoms.     Clinical impression Continued working on LE strengthening to decrease difficulty performing standing tasks, improve balance and bone growth. Also worked on gait  with SPC to promote mobility and balance. Pt tolerated session well without aggravation of symptoms. Pt will benefit from continued skilled physical therapy services to improve strength, balance, and function.          PT Short Term Goals - 01/28/19 1929      PT SHORT TERM GOAL #1   Title  Patient will be independent with her HEP to improve strength and balance.     Time  3    Period  Weeks    Status  On-going    Target Date  02/21/19        PT Long Term Goals - 02/04/19 1228      PT LONG TERM GOAL #1   Title  Patient will improve her 10 MWT to 0.8 m/s or more with rw to promote better community ambulation.     Baseline  0.5 seconds with rw (09/06/2018); 0.63 seconds average with rw (10/18/2018); 0.69 m/s average using rw (12/10/2018); 0.59 m/s using SPC and CGA (01/08/2019); 0.60 m/s using SPC (01/21/2019); 0.56 m/s with SPC to no AD assist, CGA (02/04/2019)    Time  8    Period  Weeks    Status  On-going    Target Date  03/28/19      PT LONG TERM GOAL #2   Title  Patient will improve her DGI score using rw or least restrictive AD to 19/24 or more to promote balance.     Baseline  13/24 using rw (09/06/2018); 14/24 using rw (10/29/2018); 16/24 (12/10/2018); 14/24 using SPC (01/21/2019)    Time  8    Period  Weeks    Status  On-going    Target Date  03/28/19      PT LONG TERM GOAL #3   Title  Patient will improve bilateral LE strength by at least 1/2 MMT to promote ability to support herself when performing standing tasks and improve balance.     Time  8    Period  Weeks    Status  On-going    Target Date  03/28/19      PT LONG TERM GOAL #4   Title  Patient will have a decrease in back pain/ache to 5/10 or less to promote ability to perform standing tasks with less difficulty.    Baseline   8/10 at most for the past 3 months (09/06/2018); 5-6/10 at worst for the past 7 days (10/18/2018); 5/10 at worst for the past 7 days (12/10/2018)    Time  6    Period  Weeks    Status  Achieved            Plan - 02/21/19 1149    Clinical Impression Statement  Continued working on LE strengthening to decrease difficulty performing standing tasks, improve balance and bone growth. Also worked on gait with SPC to promote mobility and balance. Pt tolerated session well without aggravation of symptoms. Pt will benefit from continued skilled physical therapy services to improve strength, balance, and function.     Rehab Potential  Fair    Clinical Impairments Affecting Rehab Potential  (-) chronicity of condition, age, comorbidities; (+) motivated    PT Frequency  2x / week    PT Duration  8 weeks    PT Treatment/Interventions  Aquatic Therapy;Gait training;Therapeutic activities;Therapeutic exercise;Balance training;Neuromuscular re-education;Patient/family education;Manual techniques;Dry needling    PT Next Visit Plan  trunk, hip, scapular strengthening, balance, gait, manual techniques PRN    Consulted and Agree with Plan of Care  Patient       Patient will benefit from skilled therapeutic intervention in order to improve the following deficits and impairments:  Abnormal gait, Decreased activity tolerance, Decreased balance, Decreased endurance, Decreased range of motion, Decreased strength, Difficulty walking, Improper body mechanics, Postural dysfunction, Pain, Decreased knowledge of use of DME  Visit Diagnosis: Unsteadiness on feet  Difficulty in walking, not elsewhere classified  Muscle weakness (generalized)  History of falling  Chronic low back pain, unspecified back pain laterality, unspecified whether sciatica present     Problem List Patient Active Problem List   Diagnosis Date Noted  . Septic olecranon bursitis of left elbow 03/27/2018  . Lymphedema 10/11/2017  .  Chronic venous insufficiency 10/11/2017  . Leg pain 10/11/2017  . Leg swelling 10/11/2017  . COPD (chronic obstructive pulmonary disease) (HCC) 10/11/2017  . Essential hypertension 10/11/2017    Loralyn Freshwater PT, DPT   02/21/2019, 12:24 PM  [place] PHYSICAL AND SPORTS MEDICINE [address], [place], [address] Phone: [phone]   Fax:  [phone]  Name: SHELBY NILA MRN: [id number] Date of Birth: 01/13/1930

## 2019-02-26 ENCOUNTER — Ambulatory Visit: Payer: Medicare Other

## 2019-02-26 ENCOUNTER — Other Ambulatory Visit: Payer: Self-pay

## 2019-02-26 DIAGNOSIS — M6281 Muscle weakness (generalized): Secondary | ICD-10-CM

## 2019-02-26 DIAGNOSIS — M545 Low back pain: Secondary | ICD-10-CM

## 2019-02-26 DIAGNOSIS — R2681 Unsteadiness on feet: Secondary | ICD-10-CM

## 2019-02-26 DIAGNOSIS — R262 Difficulty in walking, not elsewhere classified: Secondary | ICD-10-CM

## 2019-02-26 DIAGNOSIS — G8929 Other chronic pain: Secondary | ICD-10-CM

## 2019-02-26 DIAGNOSIS — Z9181 History of falling: Secondary | ICD-10-CM

## 2019-02-26 NOTE — Therapy (Signed)
[facility] [facility] PHYS[facility]TS MEDICINE [address] [place], [place], [address] Phone: [phone]   Fax:  [phone]  Physical Therapy Treatment  Patient Details  Name: [name] MRN: [id number] Date of Birth: 12/02/1930 Referring Provider (PT): Cristopher PeruHemang Shah, MD   Encounter Date: 02/26/2019  PT End of Session - 02/26/19 1122    Visit Number  36    Number of Visits  62    Date for PT Re-Evaluation  03/28/19    Authorization Type  6    Authorization Time Period  of 10 progress report    PT Start Time  1122    PT Stop Time  1213    PT Time Calculation (min)  51 min    Equipment Utilized During Treatment  Gait belt    Activity Tolerance  Patient tolerated treatment well    Behavior During Therapy  WFL for tasks assessed/performed       Past Medical History:  Diagnosis Date  . Anemia   . GERD (gastroesophageal reflux disease)   . Hypertension   . Peripheral vascular disease (HCC)    possible neuropathies in lower extremeties  . PONV (postoperative nausea and vomiting)    happens sometimes but better with pre med of zofran  . Presence of permanent cardiac pacemaker   . Syncope     Past Surgical History:  Procedure Laterality Date  . ABDOMINAL HYSTERECTOMY  1969  . APPENDECTOMY  1969   with hysterectomy  . BACK SURGERY  2001   rods in back. surgery on back x 3  . COLONOSCOPY    . I&D EXTREMITY Left 03/27/2018   Procedure: IRRIGATION AND DEBRIDEMENT LEFT ELBOW / OLECRANON BURSA;  Surgeon: Christena FlakePoggi, John J, MD;  Location: [facility];  Service: Orthopedics;  Laterality: Left;  . I&D EXTREMITY Left 06/05/2018   Procedure: IRRIGATION AND DEBRIDEMENT EXTREMITY;  Surgeon: Christena FlakePoggi, John J, MD;  Location: [facility];  Service: Orthopedics;  Laterality: Left;  . INSERT / REPLACE / REMOVE PACEMAKER  2004  . JOINT REPLACEMENT Left 2004   partial knee replacement  . OLECRANON BURSECTOMY Left 03/27/2018   Procedure: LEFT OLECRANON BURSA;  Surgeon:  Christena FlakePoggi, John J, MD;  Location: [facility];  Service: Orthopedics;  Laterality: Left;  . PACEMAKER INSERTION  2014   dual chamber for complete heart block    There were no vitals filed for this visit.  Subjective Assessment - 02/26/19 1124    Subjective  Feeling ok. Just really concerned about the COVID-19. Pt states that she has a bump on the top of her L foot. Has been there for years. Her rheumatologist knows about it. No unexplained changes in weight.      Pertinent History  Imparied gait. Pt states that she has had balance issues for too long. Feels more comfortable walking with her rw. Had tests which showed that she has neuropathy. However [facility] that her balance is coming from her back. Just wants to be strong so she can walk.  Started walking with her rw off and on for the past several months.  Also feels like she might need a new sleep number bed. Also had 2 wound surgeries at [name]. Pt fell in February 2019. Had to stop PT due to the L elbow surgery April 06, 2018 to remove a bursa. The wound still has not healed.   Currently sees a wound doctor.   Feels numbness L foot from  the ankle down.  No R foot numbness.  Sometimes unsure of where her L LE is in space.  Pt also states that she tires too quickly.  Has not been able to walk her dog in 2 years. Has not really been doing exercises at home except the sit <> stand. Pt states that prolonged sitting without back support will increase back pain     Limitations  Standing;Walking    How long can you sit comfortably?  1 hr    How long can you stand comfortably?  10 minutes    How long can you walk comfortably?  2 or 3 blocks    Diagnostic tests  EMG testing BLEs and BUEs which were negative    Patient Stated Goals  Improve LE strength. Wants to use the machines at the gym. Walk more steadily. Be able to take her dog (16 lbs) out on a leash.     Currently in Pain?  Other (Comment)    Pain Score  --   no complain of  pain   Pain Onset  More than a month ago                               PT Education - 02/26/19 1128    Education provided  Yes    Education Details  ther-ex    [employer]) Educated  Patient    Methods  Explanation;Demonstration;Tactile cues;Verbal cues    Comprehension  Returned demonstration;Verbalized understanding      Objectives  Pt ambulating with rw  TTP bilateral greater trochanter L >R   L elbow wound  No latex band allergies    MedbridgeAccess Code: [email or web address]   Palpation: spherical mass L dorsal medial foot. Pt was recommended to let her PCP know about it. Pt states that he might send her to an orthopedist.   Gait  Gait with SPC 150 ft CGA.  No dizziness  Cues to increase hip and knee flexion to promote better foot clearance. Not as afraid of walking around people observed  Gait with SPC 32 ft with cues for increased gait speed. CGA  Stepping over 4 therabands on the floor with SPC with CGA to prepare for stepping over obstacles using SPC.   8x (2x with PT hand held assist). LOB x 2 towards last 2 repetitions, min to mod A to recover. Pt better able to clear obstacle with hand held assist in which balance confidence may play a factor. Pt seemed more confident.    Improved technique, movement at target joints, use of target muscles after min to mod verbal, visual, tactile cues.     Therapeutic exercise   Seated resisted trunk extension isometrics at neutral 10x5 seconds for 3 sets  Leg press seat 2,  B LE plate 45 for 40X7 Then plate 45 and a 5 lbs dumbbell 5x3  Performed to promote LE strengthening (glute and quad) as well as for weight bearing exercise to help promote bone density   Standing hip abduction with B UE assist 3 lbs ankle weight R 10x3 L 10x3   Nustep seat 5, R arm 5 at level 2 for 5 minutes. Cues  for pacing.   Performed to promote joint nutrition, as well as cardiovascular endurance   Improved exercise technique, movement at target joints, use of target muscles after min to mod verbal, visual, tactile cues.       Response to treatment Good muscle use  felt with exercises.Pt tolerated session well without aggravation of symptoms.     Clinical impression Continued working on LE strengthening to promote ability to ambulate and perform standing tasks with less difficulty. Also worked on gait speed using SPC but pt seems to have difficulty increasing her gait velocity. Worked on stepping over obstacles using SPC to help decrease fall risk if ambulating with the AD. Difficulty to perform in which balance confidence may play a factor. Pt able to step over obstacle with PT one hand held assist with min assistance. Pt will benefit from continued skilled physical therapy services to improve strength, balance, function, and ability to ambulate.      PT Short Term Goals - 01/28/19 1929      PT SHORT TERM GOAL #1   Title  Patient will be independent with her HEP to improve strength and balance.     Time  3    Period  Weeks    Status  On-going    Target Date  02/21/19        PT Long Term Goals - 02/04/19 1228      PT LONG TERM GOAL #1   Title  Patient will improve her 10 MWT to 0.8 m/s or more with rw to promote better community ambulation.     Baseline  0.5 seconds with rw (09/06/2018); 0.63 seconds average with rw (10/18/2018); 0.69 m/s average using rw (12/10/2018); 0.59 m/s using SPC and CGA (01/08/2019); 0.60 m/s using SPC (01/21/2019); 0.56 m/s with SPC to no AD assist, CGA (02/04/2019)    Time  8    Period  Weeks    Status  On-going    Target Date  03/28/19      PT LONG TERM GOAL #2   Title  Patient will improve her DGI score using rw or least restrictive AD to 19/24 or more to promote balance.     Baseline  13/24 using rw (09/06/2018); 14/24 using rw (10/29/2018); 16/24  (12/10/2018); 14/24 using SPC (01/21/2019)    Time  8    Period  Weeks    Status  On-going    Target Date  03/28/19      PT LONG TERM GOAL #3   Title  Patient will improve bilateral LE strength by at least 1/2 MMT to promote ability to support herself when performing standing tasks and improve balance.     Time  8    Period  Weeks    Status  On-going    Target Date  03/28/19      PT LONG TERM GOAL #4   Title  Patient will have a decrease in back pain/ache to 5/10 or less to promote ability to perform standing tasks with less difficulty.    Baseline  8/10 at most for the past 3 months (09/06/2018); 5-6/10 at worst for the past 7 days (10/18/2018); 5/10 at worst for the past 7 days (12/10/2018)    Time  6    Period  Weeks    Status  Achieved            Plan - 02/26/19 1128    Clinical Impression Statement  Continued working on LE strengthening to promote ability to ambulate and perform standing tasks with less difficulty. Also worked on gait speed using SPC but pt seems to have difficulty increasing her gait velocity. Worked on stepping over obstacles using SPC to help decrease fall risk if ambulating with the AD. Difficulty to perform in which balance confidence  may play a factor. Pt able to step over obstacle with PT one hand held assist with min assistance. Pt will benefit from continued skilled physical therapy services to improve strength, balance, function, and ability to ambulate.     Rehab Potential  Fair    Clinical Impairments Affecting Rehab Potential  (-) chronicity of condition, age, comorbidities; (+) motivated    PT Frequency  2x / week    PT Duration  8 weeks    PT Treatment/Interventions  Aquatic Therapy;Gait training;Therapeutic activities;Therapeutic exercise;Balance training;Neuromuscular re-education;Patient/family education;Manual techniques;Dry needling    PT Next Visit Plan  trunk, hip, scapular strengthening, balance, gait, manual techniques PRN    Consulted and  Agree with Plan of Care  Patient       Patient will benefit from skilled therapeutic intervention in order to improve the following deficits and impairments:  Abnormal gait, Decreased activity tolerance, Decreased balance, Decreased endurance, Decreased range of motion, Decreased strength, Difficulty walking, Improper body mechanics, Postural dysfunction, Pain, Decreased knowledge of use of DME  Visit Diagnosis: Unsteadiness on feet  Difficulty in walking, not elsewhere classified  Muscle weakness (generalized)  History of falling  Chronic low back pain, unspecified back pain laterality, unspecified whether sciatica present     Problem List Patient Active Problem List   Diagnosis Date Noted  . Septic olecranon bursitis of left elbow 03/27/2018  . Lymphedema 10/11/2017  . Chronic venous insufficiency 10/11/2017  . Leg pain 10/11/2017  . Leg swelling 10/11/2017  . COPD (chronic obstructive pulmonary disease) (HCC) 10/11/2017  . Essential hypertension 10/11/2017    [name] PT, DPT   02/26/2019, 12:42 PM  [place] [facility] PHYSICAL AND SPORTS MEDICINE [address], [place], [address] Phone: [phone]   Fax:  [phone]  Name: [name] MRN: [id number] Date of Birth: 08/03/1930

## 2019-02-27 ENCOUNTER — Encounter: Payer: Medicare Other | Admitting: Internal Medicine

## 2019-02-27 DIAGNOSIS — L97905 Non-pressure chronic ulcer of unspecified part of unspecified lower leg with muscle involvement without evidence of necrosis: Secondary | ICD-10-CM | POA: Diagnosis not present

## 2019-02-28 ENCOUNTER — Other Ambulatory Visit: Payer: Self-pay

## 2019-02-28 ENCOUNTER — Ambulatory Visit: Payer: Medicare Other

## 2019-02-28 DIAGNOSIS — R2681 Unsteadiness on feet: Secondary | ICD-10-CM | POA: Diagnosis not present

## 2019-02-28 DIAGNOSIS — Z9181 History of falling: Secondary | ICD-10-CM

## 2019-02-28 DIAGNOSIS — M6281 Muscle weakness (generalized): Secondary | ICD-10-CM

## 2019-02-28 DIAGNOSIS — R262 Difficulty in walking, not elsewhere classified: Secondary | ICD-10-CM

## 2019-02-28 NOTE — Therapy (Signed)
[place] [facility] [address] [place], [address] Phone: [phone]   Fax:  [phone]  Physical Therapy Treatment  Patient Details  Name: [name] MRN: [id number] Date of Birth: 07/18/1930 Referring Provider (PT): Cristopher Peru, MD   Encounter Date: 02/28/2019  PT End of Session - 02/28/19 1127    Visit Number  37    Number of Visits  62    Date for PT Re-Evaluation  03/28/19    Authorization Type  7    Authorization Time Period  of 10 progress report    PT Start Time  1128    PT Stop Time  1218    PT Time Calculation (min)  50 min    Equipment Utilized During Treatment  Gait belt    Activity Tolerance  Patient tolerated treatment well    Behavior During Therapy  [facility] for tasks assessed/performed       Past Medical History:  Diagnosis Date  . Anemia   . GERD (gastroesophageal reflux disease)   . Hypertension   . Peripheral vascular disease (HCC)    possible neuropathies in lower extremeties  . PONV (postoperative nausea and vomiting)    happens sometimes but better with pre med of zofran  . Presence of permanent cardiac pacemaker   . Syncope     Past Surgical History:  Procedure Laterality Date  . ABDOMINAL HYSTERECTOMY  1969  . APPENDECTOMY  1969   with hysterectomy  . BACK SURGERY  2001   rods in back. surgery on back x 3  . COLONOSCOPY    . I&D EXTREMITY Left 03/27/2018   Procedure: IRRIGATION AND DEBRIDEMENT LEFT ELBOW / OLECRANON BURSA;  Surgeon: Christena Flake, MD;  Location: [facility];  Service: Orthopedics;  Laterality: Left;  . I&D EXTREMITY Left 06/05/2018   Procedure: IRRIGATION AND DEBRIDEMENT EXTREMITY;  Surgeon: Christena Flake, MD;  Location: [facility];  Service: Orthopedics;  Laterality: Left;  . INSERT / REPLACE / REMOVE PACEMAKER  2004  . JOINT REPLACEMENT Left 2004   partial knee replacement  . OLECRANON BURSECTOMY Left 03/27/2018   Procedure: LEFT OLECRANON BURSA;  Surgeon:  Christena Flake, MD;  Location: [facility];  Service: Orthopedics;  Laterality: Left;  . PACEMAKER INSERTION  2014   dual chamber for complete heart block    There were no vitals filed for this visit.  Subjective Assessment - 02/28/19 1131    Subjective  Pt states wearing her support stocking yesterday which stopped right below her knee. Her L medial knee area bothers her.     Pertinent History  Imparied gait. Pt states that she has had balance issues for too long. Feels more comfortable walking with her rw. Had tests which showed that she has neuropathy. However the doctor at [facility] said that her balance is coming from her back. Just wants to be strong so she can walk.  Started walking with her rw off and on for the past several months.  Also feels like she might need a new sleep number bed. Also had 2 wound surgeries at [name]. Pt fell in February 2019. Had to stop PT due to the L elbow surgery April 06, 2018 to remove a bursa. The wound still has not healed.   Currently sees a wound doctor.   Feels numbness L foot from the ankle down.  No R foot numbness.  Sometimes unsure of where her L LE is in  space.  Pt also states that she tires too quickly.  Has not been able to walk her dog in 2 years. Has not really been doing exercises at home except the sit <> stand. Pt states that prolonged sitting without back support will increase back pain     Limitations  Standing;Walking    How long can you sit comfortably?  1 hr    How long can you stand comfortably?  10 minutes    How long can you walk comfortably?  2 or 3 blocks    Diagnostic tests  EMG testing BLEs and BUEs which were negative    Patient Stated Goals  Improve LE strength. Wants to use the machines at the gym. Walk more steadily. Be able to take her dog (16 lbs) out on a leash.     Currently in Pain?  Yes    Pain Score  5    when walking (L pes anserine area)   Pain Onset  More than a month ago                                PT Education - 02/28/19 1156    Education provided  Yes    Education Details  ther-ex    Starwood HotelsPerson(s) Educated  Patient    Methods  Explanation;Demonstration;Tactile cues;Verbal cues    Comprehension  Returned demonstration;Verbalized understanding      Objectives  Pt ambulating with rw  TTP bilateral greater trochanter L >R   L elbow wound  No latex band allergies    MedbridgeAccess Code: [email or web address]   TTP L pes anserine location    Therapeutic exercise   Seated resisted trunk extension isometrics at neutral 10x5 seconds for 3 sets  Seated L knee flexion isometrics around 40% effort 3x 1 min Seated L knee extension isometrics around 40% effort 3x 1 min Seated hip adduction ball and glute max squeeze about 40% effort 3x 1 minute   Aforementioned exercises performed to promote comfortable tension to the tendons of the muscles in the pes anserinus.   Standing hip abduction with B UE assist 3 lbs ankle weight R 10x3 L 10x3   Leg press seat 2,  B LE plate 45 with 5 lbs dumbbell for 10x2  Performed to promote LE strengthening (glute and quad) as well as for weight bearing exercise to help promote bone density    Improved exercise technique, movement at target joints, use of target muscles after min to mod verbal, visual, tactile cues.     Gait  Gait with PT one hand held assist min to mod A, to no A but CGA 115 ft, then 200 ft            Cues to increase hip and knee flexion to promote better foot clearance.  No L medial knee pain  Improved technique, movement at target joints, use of target muscles after min to mod verbal, visual, tactile cues.   Response to treatment Good muscle use felt with exercises.Pt tolerated session well without aggravation of symptoms. No L medial knee pain with gait afterwards.    Clinical  impression Pt demonstrates TTP L pes anserinus. Worked on isometric knee flexion, knee extension and hip adduction to help address. No L medial knee pain with gait after session. Continued working on glute and quadriceps strengthening as well as walking with as little assist as possible to promote balance, ability to ambulate, and perform  standing tasks. Pt will benefit from continued skilled physical therapy services to improve strength, ambulation, and function.      PT Short Term Goals - 01/28/19 1929      PT SHORT TERM GOAL #1   Title  Patient will be independent with her HEP to improve strength and balance.     Time  3    Period  Weeks    Status  On-going    Target Date  02/21/19        PT Long Term Goals - 02/04/19 1228      PT LONG TERM GOAL #1   Title  Patient will improve her 10 MWT to 0.8 m/s or more with rw to promote better community ambulation.     Baseline  0.5 seconds with rw (09/06/2018); 0.63 seconds average with rw (10/18/2018); 0.69 m/s average using rw (12/10/2018); 0.59 m/s using SPC and CGA (01/08/2019); 0.60 m/s using SPC (01/21/2019); 0.56 m/s with SPC to no AD assist, CGA (02/04/2019)    Time  8    Period  Weeks    Status  On-going    Target Date  03/28/19      PT LONG TERM GOAL #2   Title  Patient will improve her DGI score using rw or least restrictive AD to 19/24 or more to promote balance.     Baseline  13/24 using rw (09/06/2018); 14/24 using rw (10/29/2018); 16/24 (12/10/2018); 14/24 using SPC (01/21/2019)    Time  8    Period  Weeks    Status  On-going    Target Date  03/28/19      PT LONG TERM GOAL #3   Title  Patient will improve bilateral LE strength by at least 1/2 MMT to promote ability to support herself when performing standing tasks and improve balance.     Time  8    Period  Weeks    Status  On-going    Target Date  03/28/19      PT LONG TERM GOAL #4   Title  Patient will have a decrease in back pain/ache to 5/10 or less to promote ability  to perform standing tasks with less difficulty.    Baseline  8/10 at most for the past 3 months (09/06/2018); 5-6/10 at worst for the past 7 days (10/18/2018); 5/10 at worst for the past 7 days (12/10/2018)    Time  6    Period  Weeks    Status  Achieved            Plan - 02/28/19 1126    Clinical Impression Statement  Pt demonstrates TTP L pes anserinus. Worked on isometric knee flexion, knee extension and hip adduction to help address. No L medial knee pain with gait after session. Continued working on glute and quadriceps strengthening as well as walking with as little assist as possible to promote balance, ability to ambulate, and perform standing tasks. Pt will benefit from continued skilled physical therapy services to improve strength, ambulation, and function.     Rehab Potential  Fair    Clinical Impairments Affecting Rehab Potential  (-) chronicity of condition, age, comorbidities; (+) motivated    PT Frequency  2x / week    PT Duration  8 weeks    PT Treatment/Interventions  Aquatic Therapy;Gait training;Therapeutic activities;Therapeutic exercise;Balance training;Neuromuscular re-education;Patient/family education;Manual techniques;Dry needling    PT Next Visit Plan  trunk, hip, scapular strengthening, balance, gait, manual techniques PRN    Consulted and Agree with  Plan of Care  Patient       Patient will benefit from skilled therapeutic intervention in order to improve the following deficits and impairments:  Abnormal gait, Decreased activity tolerance, Decreased balance, Decreased endurance, Decreased range of motion, Decreased strength, Difficulty walking, Improper body mechanics, Postural dysfunction, Pain, Decreased knowledge of use of DME  Visit Diagnosis: Unsteadiness on feet  Difficulty in walking, not elsewhere classified  Muscle weakness (generalized)  History of falling     Problem List Patient Active Problem List   Diagnosis Date Noted  . Septic  olecranon bursitis of left elbow 03/27/2018  . Lymphedema 10/11/2017  . Chronic venous insufficiency 10/11/2017  . Leg pain 10/11/2017  . Leg swelling 10/11/2017  . COPD (chronic obstructive pulmonary disease) (HCC) 10/11/2017  . Essential hypertension 10/11/2017    Loralyn Freshwater PT, DPT   02/28/2019, 2:27 PM  [place] [facility] [facility] PHYSICAL AND SPORTS MEDICINE [address], [place], [address] Phone: [phone]   Fax:  [phone]  Name: [name] MRN: [id number] Date of Birth: 05-11-1930

## 2019-02-28 NOTE — Progress Notes (Signed)
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 wearing them as much since she was dealing with this issue due to the fact that she notes that the wound seems to leak and bleed more when she has the compression hose on. 11/22/17; patient was readmitted to clinic last week with a traumatic wound on her right anterior leg. This is a reasonably small wound but covered in an adherent necrotic debris. She is been using Santyl. 11/29/17 minimal improvement in wound dimensions to this initially traumatic wound on her right anterior leg. Reasonably small wound but still adherent thick necrotic debris. We have been using Santyl 12/06/17 traumatic wound on the right anterior leg. Small wound but again adherent necrotic debris on the surface 95%. We have been using Santyl 12/13/86; small lright anterior traumatic leg wound. Using Santyl that again with adherent debris perhaps down to 50%. I changed her to Iodoflex today 12/20/17; right anterior leg traumatic wound. She again presents with debris about 50% of the wound. I changed her to Iodoflex last week but so far not a lot in the way of response 12/27/17; right anterior leg traumatic wound. She again presents with debris on the wound although it looks better. She  is using Iodoflex entering her third week now. Still requiring debridement 01/16/18 on evaluation today patient seems to be doing fairly well in regard to her right lower extremity ulcer. She has been tolerating the dressing changes without complication. With that being said she does note that she's been having a lot of burning with the current dressing which is specifically the Iodoflex.  Obviously this is a known side effect of the iodine in the dressing and I believe that may be giving her trouble. No fevers, chills, nausea, or vomiting noted at this time. Otherwise the wound does appear to be doing well. 01/30/18 on evaluation today patient appears to be doing well in regard to her right anterior lower extremity ulcer. She notes that this does seem to be smaller and she wonders why we did not start the Prisma dressing sooner since it has made such a big difference in such a short amount of time. I explained that obviously we have to wait for the wound to get to a certain point along his healing path before we can initiate the Prisma otherwise it will not be effective. Therefore once the wound became [name], [name] ([id number]) clean it was then time to initiate the Prisma. Nonetheless good news is she is noting excellent improvement she does still have some discomfort but nothing as significant as previously noted. 04/17/18 on evaluation today patient appears to be doing very well and in fact her right lower extremity ulcer has completely healed at this point I'm pleased with this. The left lower extremity ulcer seem to be doing better although she still does have some openings noted the Prisma I think is helping more than the Xeroform was in my pinion. With that being said she still has a lot of healing to do in this regard. 04/27/18 on evaluation today patient appears to be doing very well in regard to her left lower Trinity ulcers. She has been tolerating the dressing changes without  complication. I do have a note from her orthopedic surgeon today and they would like for me to help with treating her left elbow surgery site where she had the bursa removed and this was performed roughly 4 weeks ago according to the note that I reviewed. She has been placed on Bactrim DS by need for her leg wounds this probably helped a little bit with the left elbow surgery site. Obviously I do think this is something we can try to help her out with. 05/04/18 on evaluation today patient appears to be doing well in regard to her left anterior lower Trinity ulcers. She is making good progress which is great news. Unfortunately her elbow which we are also managing at this point in time has not made as much progress unfortunately. She has been tolerating the dressing changes without complication. She did see Dr. Darleen Crocker earlier today and he states that he's willing to give this three weeks to see if she's making any progress with wound care. However he states that she's really not then he will need to go back in and perform further surgery. Obviously she is trying to avoid surgery if at all possible although I'm not sure if this is going to be possible or least not that quickly. 05/11/18 on evaluation today patient appears to be doing very well in regard to her left lower extremity ulcers. Unfortunately in regard to her elbow this is very slow coming about as far as any improvement is concerned. I do feel like there may be a little bit more granulation noted in the base of the wound but nothing too significant unfortunately. I still can probe bone in the proximal portion of the wound which obviously explain to the patient is not good. She will be having a follow-up with her orthopedic surgeon in the next couple of weeks. In the meantime we are trying to do as  much as we can to try to show signs of improvement in healing to avoid the need for any additional and further surgery. Nonetheless I explained to the  patient yet again today I'm not sure if that is going to be feasible or not obviously it's more risk for her to continue to have an open wound with bone exposure then to the back in for additional surgery even though I know she doesn't want to go that route. 05/15/18 on evaluation today patient presents for follow-up concerning her ongoing lower extremity ulcers on the left as well as the left elbow ulcer. She has at this point in time been tolerating the dressing changes without complication. Her left lower extremity ulcer appears to be doing very well. In regard to the left elbow ulcer she actually does seem to have additional granulation today which is good news. I am definitely seeing signs of improvement although obviously this is somewhat slow improvement. Nonetheless I'm hopeful we will be able to avoid her having to have any further surgery but again that would definitely be a conversation between herself as well as her surgeon once he sees her for reevaluation. Otherwise she does want to see about having a three order compression stockings for her today 05/21/18 on evaluation today patient appears to be doing well in regard to her left lower surety ulcer. This is almost completely healed and seems to be progressing very nicely. With that being said her left elbow is another story. I'm not really convinced in the past three weeks we've seen a significant improvement in this wound. With that being said if this is something that there is no surgical option for him we have to continue to work on this from the standpoint of conservative management with wound care she may make improvement given time. Nonetheless it appears that her surgeon is somewhat concerned about the possibility of infection and really is leaning towards additional surgery to try and help close this wound. Nonetheless the patient is still unsure of exactly what to do. 05/29/18 on evaluation today patient appears to be doing well  in regard to her left lower extremity ulcer. She's been tolerating the dressing changes without complication which is good news. With that being said she's been having issues specifically with her elbow she did see her surgeon Dr. Joice Lofts and he is recommending a repeat surgery to the left elbow in order to correct the issue. The patient is still somewhat unsure of this but feels like this may be better than trying to take time to let this heal over a longer period of time through normal wound care measures. Again I explained that I agree this may be a faster way to go if her surgeon feels that this is indeed a good direction to take. Obviously only he can make the judgment on whether or not the surgery would likely be successful. 06/04/18 on evaluation today patient actually presents for follow-up concerning her left elbow and left lower from the ulcer she seems to be doing very well at this point in time. She has been tolerating the dressing changes without complication. With that being said her elbow is not significantly better she actually is scheduled for surgery tomorrow. 07/04/18; the patient had an area on her left leg that is remaining closed. The open area she has now is a postsurgical wound on the left elbow. I think we have clearance from the surgeon to see this now. We're using [employer], Marye V. (  [id number]) 07/11/18; we're currently dealing with a surgical wound on the left olecranon process. The patient complains of a lot of pain and drainage. When I saw her last week we did an x-ray that showed soft tissue wound and probable elbow joint effusion but no erosion to suggest osteomyelitis. The culture I did of this was somewhat surprisingly negative. She has a small open wound with not a viable surface there is considerable undermining relative to the wound size. She is on methotrexate for rheumatoid arthritis/overlap syndrome also plaquenil. We've been using silver collagen 07/18/18-She  is seen in follow-up evaluation for a left elbow wound. There is essentially no change. She is currently on Zithromax and will complete that on Friday, there is no indication to extend this. We will change to iodosorb/iodoflex and monitor for response 07/25/18-She is seen in follow-up evaluation for left elbow wound. The wound is stable with no overt evidence of infection. She has counseled with her rheumatologist. She is wanting to restart her methotrexate; a culture was obtained to rule out occult infection before starting her methotrexate. We will continue with Iodosorb/Iodoflex and she will follow-up next week. 08/01/18; this is a difficult wound over her left olecranon process. There is been concerned about infection although cultures including one done last week were negative. Pending 3 weeks ago I gave her an empiric course of antibiotics. She is having a lot of rheumatologic pain in her hands with pain and stiffness. She wants to go on her weekly methotrexate and I think it would be reasonable to do so. We have been using Iodoflex 08/01/18; difficult wound over her left olecranon process. She started back on methotrexate last week because of rheumatologic pain in her hands. We have been using Iodoflex to try and clean out the wound bed. She has been approved for Graphix PL 08/15/18; 2 week follow-up. Difficult wound over her left olecranon process. Graphix PL #1 with collagen backing 08/22/18; one-week follow-up. Difficult wound over her left olecranon process. Graphix PL #2 08/29/18; no major improvement. Difficult wound over her left olecranon process. Still considerable undermining. Graphics PL #3 1 week follow-up. Graphix #4 09/12/18 graphics #5. Some improvement in wound area although the undermining superiorly still has not closed down as much as I would like 09/19/18; Graphix #6 I think there is improvement in the undermining from 7 to 9:00. Wound bed looks healthy. 09/26/18 Graffix #7  undermining is 0.5 cm maximally at roughly 8:00. From 12 to 7:00 the tissue is adherent which is a major improvement there is some advancing skin from this side. 10/03/18; Graphix #8 no major changes from last week 10/10/18 Graffix #9 There are improvements. There appears to be granulation coming up to the surface here and there is a lot less undermining at 8:00. 10/17/18. Graffix #10; Dimensions are improved less undermining surface felt the but the wound is still open. Initially a surgical wound following a bursectomy 10/24/18; Graffix #11. This is really stalled over the last 2 weeks. If there is no further improvement this will be the last application.The final option for this difficult area would be plastic surgery and will set up a consult with Dr. Marina Goodell in [facility] 10/31/18; wound looks about the same. The undermining superiorly is 0.7 cm. On the lateral edges perhaps some improvement there is no drainage. 11-07-2018 patient seen today for follow-up and management of left elbow wound. She has completed a total of 11 treatments of the graffix with not much improvement. She has an upcoming appointment  with plastic surgery to assist with additional treatment options for the left elbow wound on 11/19/18. There is significant amount surrounding undermining of the wound is 0.9 cm. Currently prescribed methotrexate. Wound is being treated with Indoform and border dressing. No drainage from wound. No fever, chills. or pain. 11/21/18; the patient continues to have the wound looking roughly the same with undermining from about 12 to 6:00. This has not changed all that much. She does have skin irritation around the wound that looks like drainage maceration issues. The patient states that she was not able to have her wound dressing changed because of illness in the person he usually does this. She also did not attend her clinic appointment today with Dr. Marina Goodell because of transportation issues.  She is rebooked for some time in mid January 11/28/2018; the patient has less undermining using endoform. As a understandings she saw Dr. Thad Ranger who is Dr. Lonni Fix partner. He recommended putting her in a elbow brace and I believe is written a prescription for it. He also recommended Motrin 800 mg 3 times daily. This is prescription strength ibuprofen although he did not write his prescription. This apparently was for 2 weeks. Culture I did last time grew a few methicillin sensitive staph aureus. After some difficulty due to drug intolerances/allergies and drug interactions I settled on a 5-day course of azithromycin 12/03/18 on evaluation today patient actually appears to be doing fairly well in regard to her elbow when compared to last time I evaluated her. With that being said there does not appear to be any signs of infection at this time. That was the big concern currently as far as the patient was concerned. Nonetheless I do feel like she is making progress in regard to the feeling of this ulcer it has been slow. She did see a Engineer, petroleum they are talking about putting her in a brace in order to allow this area to heal more appropriately. [name], [name] ([id number]) 12/19/2018; not much change in this from the last time I have saw this.'s much smaller area than when she first came in and with less circumferential undermining however this is never really adhered. She is wearing the brace that was given or prescribed to her by plastics. She did not have a procedure offered to attempt to close this. We have been using endoform 1/15; wound actually is not doing as well as last week. She was actually not supposed to come into this clinic again until next week but apparently her attendant noticed some redness increasing pain and she came in early. She reports the same amount of drainage. We have been using endoform. She is approved through puraply however I will only consider  starting that next week 1/22; she completed the antibiotics last week. Culture I did was negative. In spite of this there is less erythema and pain complaints in the wound. Puraply #1 applied today 1/29; Puraply #2 today. Wound surface looks a lot better post debridement of adherent fibrinous material. However undermining from 6-12 is measuring worse 2/5; Puraply #3. Using her elbow brace 2/12 puraply #4 2/19 puraply #5. The 9:00 undermining measured at 0.5 cm. Undermining from 4-11 o'clock. Surface of the wound looks better and the circumference of the wound is smaller however the undermining is not really changed 2/26; still not much improvement. She has undermining from 4-9 o'clock 0.9 cm. Surface of the wound covered and adherent debris. 3/4; still no improvement. Undermining from 4-9 o'clock still around a centimeter.  Surface of the wound looks somewhat better. No debridement is required we used endoform after we ended the trial of puraply last week 3/11; really no improvement at all. Still 1 cm undermining from roughly 9-3 o'clock. This is about a centimeter. The base of the wound looks fairly healthy. No debridement. We have been using endoform. I am really out of most usual options here. I could consider either another round of an amniotic advanced treatment product example epifix or perhaps regranex. Understandably the patient is a bit frustrated. We did send her to plastic surgery for a consult. Other than prescribing her a brace to immobilize the elbow they did not think she was a candidate for any further surgery. Notable that the patient is not using the brace today 3/18-Patient returns for attention to the left elbow area which apparently looked red at the home health visit. Patient's elbow looks the same if not better compared with last visit. The area of ulceration remains the same, the base appears healthy. We are continuing to use endoform she has been encouraged to use the  brace to keep the elbow straight Objective Constitutional sitting or standing blood pressure is within target range for patient.. Vitals Time Taken: 4:00 PM, Height: 60 in, Weight: 123 lbs, BMI: 24, Temperature: 98.0 F, Pulse: 64 bpm, Respiratory Rate: 18 breaths/min, Blood Pressure: 114/51 mmHg. General Notes: Left elbow wound exam-unchanged with no signs of infection, surrounding skin appears pinkish with no induration whatsoever Integumentary (Hair, Skin) Wound #9 status is Open. Original cause of wound was Surgical Injury. The wound is located on the Left Elbow. The wound measures 0.8cm length x 0.5cm width x 0.5cm depth; 0.314cm^2 area and 0.157cm^3 volume. There is Fat Layer (Subcutaneous Tissue) Exposed exposed. There is no tunneling noted, however, there is undermining starting at 6:00 and ending at 12:00 with a maximum distance of 0.8cm. There is a medium amount of purulent drainage noted. The wound margin is distinct with the outline attached to the wound base. There is no granulation within the wound bed. There is a large (67-100%) amount of necrotic tissue within the wound bed including Adherent Slough. The periwound skin appearance exhibited: Maceration, Erythema. The periwound skin appearance did not exhibit: Callus, Crepitus, Excoriation, Induration, Rash, Scarring, Dry/Scaly, Atrophie Blanche, Cyanosis, Ecchymosis, Hemosiderin Staining, Mottled, Pallor, Rubor. The [name], [name] ([id number]) surrounding wound skin color is noted with erythema which is circumferential. Periwound temperature was noted as No Abnormality. The periwound has tenderness on palpation. Plan Wound Cleansing: Wound #9 Left Elbow: Clean wound with Normal Saline. Anesthetic (add to Medication List): Wound #9 Left Elbow: Topical Lidocaine 4% cream applied to wound bed prior to debridement (In Clinic Only). Primary Wound Dressing: Wound #9 Left Elbow: Other: - Endoform Secondary Dressing: Wound #9  Left Elbow: Boardered Foam Dressing Dressing Change Frequency: Wound #9 Left Elbow: Change Dressing Monday, Wednesday, Friday Follow-up Appointments: Wound #9 Left Elbow: Return Appointment in 2 weeks. 1. No changes to management 2. Continue endoform dressing, brace to elbow 3. Return to clinic as needed Electronic Signature(s) Signed: 02/27/2019 4:32:29 PM By: Cassandria Anger Entered By: Cassandria Anger on 02/27/2019 16:32:29 [name] ([id number]) -------------------------------------------------------------------------------- SuperBill Details Patient Name: [name] Date of Service: 02/27/2019 Medical Record Number: [id number] Patient Account Number: [id number] Date of Birth/Sex: 09-12-1930 (85 y.o. 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

## 2019-03-04 NOTE — Therapy (Signed)
[facility] AND [facility]ICINE [address], [place], [address] Phone: [phone]   Fax:  [phone]  Patient Details  Name: [name] MRN: [id number] Date of Birth: November 11, 1930 Referring Provider:  No ref. provider found  Encounter Date: 03/04/2019      Called patient and informed patient about current clinic closure for a minimum of 2 weeks due to the corona virus outbreak. Pt states that her L knee area has not bothered her since last treatment. Reviewed pt HEP. Also added standing mini squats with B UE assist 2x5 with 5 second holds so long as her knees do not bother her. Pt verbalized understanding. Pt was informed that when the clinic re-opens, someone should call her to reschedule more appointments if needed.     Loralyn Freshwater PT, DPT   03/04/2019, 12:52 PM  [facility] PHYSICAL AND SPORTS MEDICINE [address], [place], [address] Phone: [phone]   Fax:  [phone]

## 2019-03-04 NOTE — Patient Instructions (Signed)
Added standing mini squats with B UE assist 2x5 with 5 second holds so long as her knees do not bother her. Pt verbalized understanding.

## 2019-03-05 ENCOUNTER — Ambulatory Visit: Payer: Medicare Other

## 2019-03-05 NOTE — Progress Notes (Signed)
LAILI, MANARY ([id number]) Visit Report for 02/27/2019 Arrival Information Details Patient Name: [name], [name] Date of Service: 02/27/2019 3:45 PM Medical Record Number: [id number] Patient Account Number: [id number] Date of Birth/Sex: 1930-09-23 (85 y.o. F) Treating RN: Arnette Norris Primary Care Moorea Boissonneault: Aram Beecham Other Clinician: Referring Adel Burch: Aram Beecham Treating Oland Arquette/Extender: Maryla Morrow in Treatment: 39 Visit Information History Since Last Visit Added or deleted any medications: No Patient Arrived: Walker Any new allergies or adverse reactions: No Arrival Time: 16:06 Had a fall or experienced change in No Accompanied By: self activities of daily living that may affect Transfer Assistance: None risk of falls: Patient Identification Verified: Yes Signs or symptoms of abuse/neglect since last visito No Secondary Verification Process Yes Hospitalized since last visit: No Completed: Has Dressing in Place as Prescribed: Yes Patient Requires Transmission-Based No Pain Present Now: Yes Precautions: Patient Has Alerts: Yes Patient Alerts: non compressible left leg Electronic Signature(s) Signed: 03/05/2019 9:04:30 AM By: Arnette Norris Entered By: Arnette Norris on 02/27/2019 16:06:49 [name] ([id number]) -------------------------------------------------------------------------------- Clinic Level of Care Assessment Details Patient Name: [name] Date of Service: 02/27/2019 3:45 PM Medical Record Number: [id number] Patient Account Number: [id number] Date of Birth/Sex: Jun 09, 1930 (85 y.o. F) Treating RN: Huel Coventry Primary Care Jimmy Stipes: Aram Beecham Other Clinician: Referring Danel Requena: Aram Beecham Treating Mekaela Azizi/Extender: Maryla Morrow in Treatment: 45 Clinic Level of Care Assessment Items TOOL 4 Quantity Score []  - Use when only an EandM is performed on FOLLOW-UP visit 0 ASSESSMENTS - Nursing  Assessment / Reassessment []  - Reassessment of Co-morbidities (includes updates in patient status) 0 X- 1 5 Reassessment of Adherence to Treatment Plan ASSESSMENTS - Wound and Skin Assessment / Reassessment X - Simple Wound Assessment / Reassessment - one wound 1 5 []  - 0 Complex Wound Assessment / Reassessment - multiple wounds []  - 0 Dermatologic / Skin Assessment (not related to wound area) ASSESSMENTS - Focused Assessment []  - Circumferential Edema Measurements - multi extremities 0 []  - 0 Nutritional Assessment / Counseling / Intervention []  - 0 Lower Extremity Assessment (monofilament, tuning fork, pulses) []  - 0 Peripheral Arterial Disease Assessment (using hand held doppler) ASSESSMENTS - Ostomy and/or Continence Assessment and Care []  - Incontinence Assessment and Management 0 []  - 0 Ostomy Care Assessment and Management (repouching, etc.) PROCESS - Coordination of Care X - Simple Patient / Family Education for ongoing care 1 15 []  - 0 Complex (extensive) Patient / Family Education for ongoing care []  - 0 Staff obtains Chiropractor, Records, Test Results / Process Orders []  - 0 Staff telephones HHA, Nursing Homes / Clarify orders / etc []  - 0 Routine Transfer to another Facility (non-emergent condition) []  - 0 Routine Hospital Admission (non-emergent condition) []  - 0 New Admissions / Manufacturing engineer / Ordering NPWT, Apligraf, etc. []  - 0 Emergency Hospital Admission (emergent condition) X- 1 10 Simple Discharge Coordination TANERA, BAUKNIGHT ([id number]) []  - 0 Complex (extensive) Discharge Coordination PROCESS - Special Needs []  - Pediatric / Minor Patient Management 0 []  - 0 Isolation Patient Management []  - 0 Hearing / Language / Visual special needs []  - 0 Assessment of Community assistance (transportation, D/C planning, etc.) []  - 0 Additional assistance / Altered mentation []  - 0 Support Surface(s) Assessment (bed, cushion, seat,  etc.) INTERVENTIONS - Wound Cleansing / Measurement X - Simple Wound Cleansing - one wound 1 5 []  - 0 Complex Wound Cleansing - multiple wounds X- 1 5 Wound Imaging (photographs - any number of wounds) []  -  0 Wound Tracing (instead of photographs) X- 1 5 Simple Wound Measurement - one wound []  - 0 Complex Wound Measurement - multiple wounds INTERVENTIONS - Wound Dressings []  - Small Wound Dressing one or multiple wounds 0 X- 1 15 Medium Wound Dressing one or multiple wounds []  - 0 Large Wound Dressing one or multiple wounds []  - 0 Application of Medications - topical []  - 0 Application of Medications - injection INTERVENTIONS - Miscellaneous []  - External ear exam 0 []  - 0 Specimen Collection (cultures, biopsies, blood, body fluids, etc.) []  - 0 Specimen(s) / Culture(s) sent or taken to Lab for analysis []  - 0 Patient Transfer (multiple staff / Nurse, adult / Similar devices) []  - 0 Simple Staple / Suture removal (25 or less) []  - 0 Complex Staple / Suture removal (26 or more) []  - 0 Hypo / Hyperglycemic Management (close monitor of Blood Glucose) []  - 0 Ankle / Brachial Index (ABI) - do not check if billed separately X- 1 5 Vital Signs KELIAH, ELTER ([id number]) Has the patient been seen at the hospital within the last three years: Yes Total Score: 70 Level Of Care: New/Established - Level 2 Electronic Signature(s) Signed: 02/27/2019 5:54:38 PM By: Elliot Gurney, BSN, RN, CWS, Kim RN, BSN Entered By: Elliot Gurney, BSN, RN, CWS, Kim on 02/27/2019 16:34:04 [name] ([id number]) -------------------------------------------------------------------------------- Encounter Discharge Information Details Patient Name: [name] Date of Service: 02/27/2019 3:45 PM Medical Record Number: [id number] Patient Account Number: [id number] Date of Birth/Sex: [birth year] (85 y.o. F) Treating RN: Huel Coventry Primary Care Kamori Barbier: Aram Beecham Other Clinician: Referring Kidada Ging:  Aram Beecham Treating Jeanpierre Thebeau/Extender: Maryla Morrow in Treatment: 77 Encounter Discharge Information Items Discharge Condition: Stable Ambulatory Status: Ambulatory Discharge Destination: Home Transportation: Private Auto Accompanied By: self Schedule Follow-up Appointment: Yes Clinical Summary of Care: Electronic Signature(s) Signed: 02/27/2019 5:54:38 PM By: Elliot Gurney, BSN, RN, CWS, Kim RN, BSN Entered By: Elliot Gurney, BSN, RN, CWS, Kim on 02/27/2019 16:35:49 [name] ([id number]) -------------------------------------------------------------------------------- Lower Extremity Assessment Details Patient Name: [name] Date of Service: 02/27/2019 3:45 PM Medical Record Number: [id number] Patient Account Number: [id number] Date of Birth/Sex: 1930-03-12 (85 y.o. F) Treating RN: Arnette Norris Primary Care Kaveon Blatz: Aram Beecham Other Clinician: Referring Zakery Normington: Aram Beecham Treating Sissy Goetzke/Extender: Maryla Morrow in Treatment: 29 Electronic Signature(s) Signed: 03/05/2019 9:04:30 AM By: Arnette Norris Entered By: Arnette Norris on 02/27/2019 16:19:44 [name] ([id number]) -------------------------------------------------------------------------------- Multi Wound Chart Details Patient Name: [name] Date of Service: 02/27/2019 3:45 PM Medical Record Number: [id number] Patient Account Number: [id number] Date of Birth/Sex: 09-20-1930 (85 y.o. F) Treating RN: Huel Coventry Primary Care Sondi Desch: Aram Beecham Other Clinician: Referring Gerianne Simonet: Aram Beecham Treating Zell Doucette/Extender: Maryla Morrow in Treatment: 78 Vital Signs Height(in): 60 Pulse(bpm): 64 Weight(lbs): 123 Blood Pressure(mmHg): 114/51 Body Mass Index(BMI): 24 Temperature(F): 98.0 Respiratory Rate 18 (breaths/min): Photos: [N/A:N/A] Wound Location: Left Elbow N/A N/A Wounding Event: Surgical Injury N/A N/A Primary Etiology: Open Surgical  Wound N/A N/A Comorbid History: Cataracts, Glaucoma, Optic N/A N/A Neuritis, Chronic sinus problems/congestion, Middle ear problems Date Acquired: 01/26/2018 N/A N/A Weeks of Treatment: 48 N/A N/A Wound Status: Open N/A N/A Measurements L x W x D 0.8x0.5x0.5 N/A N/A (cm) Area (cm) : 0.314 N/A N/A Volume (cm) : 0.157 N/A N/A % Reduction in Area: -342.30% N/A N/A % Reduction in Volume: -1021.40% N/A N/A Starting Position 1 6 (o'clock): Ending Position 1 12 (o'clock): Maximum Distance 1 (cm): 0.8 Undermining: Yes N/A N/A Classification: Full Thickness With  Exposed N/A N/A Support Structures Exudate Amount: Medium N/A N/A Exudate Type: Purulent N/A N/A Exudate Color: yellow, brown, green N/A N/A Wound Margin: Distinct, outline attached N/A N/A Granulation Amount: None Present (0%) N/A N/A TRIVA, HUEBER ([id number]) Necrotic Amount: Large (67-100%) N/A N/A Exposed Structures: Fat Layer (Subcutaneous N/A N/A Tissue) Exposed: Yes Fascia: No Tendon: No Muscle: No Joint: No Bone: No Epithelialization: None N/A N/A Periwound Skin Texture: Excoriation: No N/A N/A Induration: No Callus: No Crepitus: No Rash: No Scarring: No Periwound Skin Moisture: Maceration: Yes N/A N/A Dry/Scaly: No Periwound Skin Color: Erythema: Yes N/A N/A Atrophie Blanche: No Cyanosis: No Ecchymosis: No Hemosiderin Staining: No Mottled: No Pallor: No Rubor: No Erythema Location: Circumferential N/A N/A Temperature: No Abnormality N/A N/A Tenderness on Palpation: Yes N/A N/A Wound Preparation: Ulcer Cleansing: N/A N/A Rinsed/Irrigated with Saline Topical Anesthetic Applied: None, Other: lidocaine 4% Treatment Notes Electronic Signature(s) Signed: 02/27/2019 5:54:38 PM By: Elliot Gurney, BSN, RN, CWS, Kim RN, BSN Entered By: Elliot Gurney, BSN, RN, CWS, Kim on 02/27/2019 16:27:27 [name]  ([id number]) -------------------------------------------------------------------------------- Multi-Disciplinary Care Plan Details Patient Name: [name] Date of Service: 02/27/2019 3:45 PM Medical Record Number: [id number] Patient Account Number: [id number] Date of Birth/Sex: [birth year] (85 y.o. F) Treating RN: Huel Coventry Primary Care Andrew Blasius: Aram Beecham Other Clinician: Referring Marvie Brevik: Aram Beecham Treating Haralambos Yeatts/Extender: Maryla Morrow in Treatment: 40 Active Inactive Soft Tissue Infection Nursing Diagnoses: Impaired tissue integrity Goals: Patient's soft tissue infection will resolve Date Initiated: 07/18/2018 Target Resolution Date: 07/27/2018 Goal Status: Active Interventions: Assess signs and symptoms of infection every visit Treatment Activities: Culture and sensitivity : 07/18/2018 Systemic antibiotics : 07/18/2018 Notes: Wound/Skin Impairment Nursing Diagnoses: Impaired tissue integrity Knowledge deficit related to ulceration/compromised skin integrity Goals: Patient/caregiver will verbalize understanding of skin care regimen Date Initiated: 11/14/2017 Target Resolution Date: 11/28/2017 Goal Status: Active Ulcer/skin breakdown will have a volume reduction of 30% by week 4 Date Initiated: 11/14/2017 Target Resolution Date: 11/28/2017 Goal Status: Active Interventions: Assess patient/caregiver ability to obtain necessary supplies Assess patient/caregiver ability to perform ulcer/skin care regimen upon admission and as needed Assess ulceration(s) every visit Treatment Activities: Skin care regimen initiated : 11/14/2017 STEPHANI, JANAK ([id number]) Notes: Electronic Signature(s) Signed: 02/27/2019 5:54:38 PM By: Elliot Gurney, BSN, RN, CWS, Kim RN, BSN Entered By: Elliot Gurney, BSN, RN, CWS, Kim on 02/27/2019 16:27:19 [name] ([id number]) -------------------------------------------------------------------------------- Pain Assessment  Details Patient Name: [name] Date of Service: 02/27/2019 3:45 PM Medical Record Number: [id number] Patient Account Number: [id number] Date of Birth/Sex: 09-Sep-1930 (85 y.o. F) Treating RN: Arnette Norris Primary Care Damesha Lawler: Aram Beecham Other Clinician: Referring Monserratt Knezevic: Aram Beecham Treating Maliki Gignac/Extender: Maryla Morrow in Treatment: 57 Active Problems Location of Pain Severity and Description of Pain Patient Has Paino Yes Site Locations Pain Location: Pain in Ulcers Pain Management and Medication Current Pain Management: Electronic Signature(s) Signed: 03/05/2019 9:04:30 AM By: Arnette Norris Entered By: Arnette Norris on 02/27/2019 16:06:59 [name] ([id number]) -------------------------------------------------------------------------------- Patient/Caregiver Education Details Patient Name: [name] Date of Service: 02/27/2019 3:45 PM Medical Record Number: [id number] Patient Account Number: [id number] Date of Birth/Gender: March 06, 1930 (85 y.o. F) Treating RN: Huel Coventry Primary Care Physician: Aram Beecham Other Clinician: Referring Physician: Aram Beecham Treating Physician/Extender: Maryla Morrow in Treatment: 16 Education Assessment Education Provided To: Patient Education Topics Provided Wound/Skin Impairment: Handouts: Caring for Your Ulcer, Other: continue wound care as prescribed Methods: Demonstration, Explain/Verbal Responses: State content correctly Electronic Signature(s) Signed: 02/27/2019 5:54:38 PM By: Elliot Gurney, BSN, RN, CWS,  Selena BattenKim RN, BSN Entered By: Elliot GurneyWoody, BSN, RN, CWS, Kim on 02/27/2019 16:35:20 Natasha BassetHENRY, Yari V. ([id number]) -------------------------------------------------------------------------------- Wound Assessment Details Patient Name: Natasha BassetHENRY, Kathee V. Date of Service: 02/27/2019 3:45 PM Medical Record Number: [id number] Patient Account Number: [id number] Date of Birth/Sex: 05/08/1930 (85  y.o. F) Treating RN: Arnette NorrisBiell, Kristina Primary Care Eriana Suliman: Aram BeechamSparks, Jeffrey Other Clinician: Referring Diamond Martucci: Aram BeechamSparks, Jeffrey Treating Lada Fulbright/Extender: Maryla MorrowMadduri, Murthy Weeks in Treatment: 6367 Wound Status Wound Number: 9 Primary Open Surgical Wound Etiology: Wound Location: Left Elbow Wound Open Wounding Event: Surgical Injury Status: Date Acquired: 01/26/2018 Comorbid Cataracts, Glaucoma, Optic Neuritis, Chronic Weeks Of Treatment: 43 History: sinus problems/congestion, Middle ear problems Clustered Wound: No Photos Wound Measurements Length: (cm) 0.8 % Reduction Width: (cm) 0.5 % Reduction Depth: (cm) 0.5 Epitheliali Area: (cm) 0.314 Tunneling: Volume: (cm) 0.157 Underminin Starting Ending P Maximum in Area: -342.3% in Volume: -1021.4% zation: None No g: Yes Position (o'clock): 6 osition (o'clock): 12 Distance: (cm) 0.8 Wound Description Full Thickness With Exposed Support Foul Odor Classification: Structures Slough/Fib Wound Margin: Distinct, outline attached Exudate Medium Amount: Exudate Type: Purulent Exudate Color: yellow, brown, green After Cleansing: No rino Yes Wound Bed Granulation Amount: None Present (0%) Exposed Structure Necrotic Amount: Large (67-100%) Fascia Exposed: No Necrotic Quality: Adherent Slough Fat Layer (Subcutaneous Tissue) Exposed: Yes Tendon Exposed: No Natasha BassetHENRY, Brynlynn V. ([id number]) Muscle Exposed: No Joint Exposed: No Bone Exposed: No Periwound Skin Texture Texture Color No Abnormalities Noted: No No Abnormalities Noted: No Callus: No Atrophie Blanche: No Crepitus: No Cyanosis: No Excoriation: No Ecchymosis: No Induration: No Erythema: Yes Rash: No Erythema Location: Circumferential Scarring: No Hemosiderin Staining: No Mottled: No Moisture Pallor: No No Abnormalities Noted: No Rubor: No Dry / Scaly: No Maceration: Yes Temperature / Pain Temperature: No Abnormality Tenderness on Palpation:  Yes Wound Preparation Ulcer Cleansing: Rinsed/Irrigated with Saline Topical Anesthetic Applied: None, Other: lidocaine 4%, Treatment Notes Wound #9 (Left Elbow) Notes Endoform, border foam dressing Electronic Signature(s) Signed: 03/05/2019 9:04:30 AM By: Arnette NorrisBiell, Kristina Entered By: Arnette NorrisBiell, Kristina on 02/27/2019 16:18:26 Natasha BassetHENRY, Annaya V. ([id number]) -------------------------------------------------------------------------------- Vitals Details Patient Name: Natasha BassetHENRY, Spirit V. Date of Service: 02/27/2019 3:45 PM Medical Record Number: [id number] Patient Account Number: [id number] Date of Birth/Sex: 05/15/1930 (85 y.o. F) Treating RN: Arnette NorrisBiell, Kristina Primary Care Merl Bommarito: Aram BeechamSparks, Jeffrey Other Clinician: Referring Damyen Knoll: Aram BeechamSparks, Jeffrey Treating Ahnaf Caponi/Extender: Maryla MorrowMadduri, Murthy Weeks in Treatment: 5267 Vital Signs Time Taken: 16:00 Temperature (F): 98.0 Height (in): 60 Pulse (bpm): 64 Weight (lbs): 123 Respiratory Rate (breaths/min): 18 Body Mass Index (BMI): 24 Blood Pressure (mmHg): 114/51 Reference Range: 80 - 120 mg / dl Electronic Signature(s) Signed: 03/05/2019 9:04:30 AM By: Arnette NorrisBiell, Kristina Entered By: Arnette NorrisBiell, Kristina on 02/27/2019 16:12:29

## 2019-03-06 ENCOUNTER — Encounter: Payer: Medicare Other | Admitting: Internal Medicine

## 2019-03-06 ENCOUNTER — Other Ambulatory Visit: Payer: Self-pay

## 2019-03-06 DIAGNOSIS — L97905 Non-pressure chronic ulcer of unspecified part of unspecified lower leg with muscle involvement without evidence of necrosis: Secondary | ICD-10-CM | POA: Diagnosis not present

## 2019-03-06 DIAGNOSIS — R2681 Unsteadiness on feet: Secondary | ICD-10-CM | POA: Diagnosis not present

## 2019-03-06 NOTE — Progress Notes (Addendum)
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 03/06/2019 12:55:01 [name] ([id number]) -------------------------------------------------------------------------------- SuperBill Details Patient Name: [name] Date of Service: 03/06/2019 Medical Record Number: [id number] Patient Account Number: [id number] Date of Birth/Sex: 10/31/1930 (85 y.o. 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

## 2019-03-07 ENCOUNTER — Ambulatory Visit: Payer: Medicare Other

## 2019-03-07 NOTE — Progress Notes (Signed)
[name], [name] ([id number]) Visit Report for 03/06/2019 Arrival Information Details Patient Name: [name], [name]. Date of Service: 03/06/2019 11:00 AM Medical Record Number: [id number] Patient Account Number: [id number] Date of Birth/Sex: 02-03-1930 (85 y.o. F) Treating RN: Arnette Norris Primary Care Delainie Chavana: Aram Beecham Other Clinician: Referring Kelli Egolf: Aram Beecham Treating Jasha Hodzic/Extender: Altamese [place] in Treatment: 66 Visit Information History Since Last Visit Added or deleted any medications: No Patient Arrived: Walker Any new allergies or adverse reactions: No Arrival Time: 11:13 Had a fall or experienced change in No Accompanied By: self activities of daily living that may affect Transfer Assistance: None risk of falls: Patient Identification Verified: Yes Signs or symptoms of abuse/neglect since last visito No Secondary Verification Process Yes Hospitalized since last visit: No Completed: Has Dressing in Place as Prescribed: Yes Patient Requires Transmission-Based No Pain Present Now: No Precautions: Patient Has Alerts: Yes Patient Alerts: non compressible left leg Electronic Signature(s) Signed: 03/07/2019 10:08:57 AM By: Arnette Norris Entered By: Arnette Norris on 03/06/2019 11:14:29 [name] ([id number]) -------------------------------------------------------------------------------- Clinic Level of Care Assessment Details Patient Name: [name] Date of Service: 03/06/2019 11:00 AM Medical Record Number: [id number] Patient Account Number: [id number] Date of Birth/Sex: 1930-03-30 (85 y.o. F) Treating RN: Huel Coventry Primary Care Chrishauna Mee: Aram Beecham Other Clinician: Referring Mavrik Bynum: Aram Beecham Treating [place] Winegarden/Extender: Altamese [place] in Treatment: 66 Clinic Level of Care Assessment Items TOOL 4 Quantity Score []  - Use when only an EandM is performed on FOLLOW-UP visit 0 ASSESSMENTS - Nursing  Assessment / Reassessment []  - Reassessment of Co-morbidities (includes updates in patient status) 0 X- 1 5 Reassessment of Adherence to Treatment Plan ASSESSMENTS - Wound and Skin Assessment / Reassessment X - Simple Wound Assessment / Reassessment - one wound 1 5 []  - 0 Complex Wound Assessment / Reassessment - multiple wounds []  - 0 Dermatologic / Skin Assessment (not related to wound area) ASSESSMENTS - Focused Assessment []  - Circumferential Edema Measurements - multi extremities 0 []  - 0 Nutritional Assessment / Counseling / Intervention []  - 0 Lower Extremity Assessment (monofilament, tuning fork, pulses) []  - 0 Peripheral Arterial Disease Assessment (using hand held doppler) ASSESSMENTS - Ostomy and/or Continence Assessment and Care []  - Incontinence Assessment and Management 0 []  - 0 Ostomy Care Assessment and Management (repouching, etc.) PROCESS - Coordination of Care X - Simple Patient / Family Education for ongoing care 1 15 []  - 0 Complex (extensive) Patient / Family Education for ongoing care X- 1 10 Staff obtains Chiropractor, Records, Test Results / Process Orders []  - 0 Staff telephones HHA, Nursing Homes / Clarify orders / etc []  - 0 Routine Transfer to another Facility (non-emergent condition) []  - 0 Routine Hospital Admission (non-emergent condition) []  - 0 New Admissions / Manufacturing engineer / Ordering NPWT, Apligraf, etc. []  - 0 Emergency Hospital Admission (emergent condition) X- 1 10 Simple Discharge Coordination AMEA, WALSHE ([id number]) []  - 0 Complex (extensive) Discharge Coordination PROCESS - Special Needs []  - Pediatric / Minor Patient Management 0 []  - 0 Isolation Patient Management []  - 0 Hearing / Language / Visual special needs []  - 0 Assessment of Community assistance (transportation, D/C planning, etc.) []  - 0 Additional assistance / Altered mentation []  - 0 Support Surface(s) Assessment (bed, cushion, seat,  etc.) INTERVENTIONS - Wound Cleansing / Measurement X - Simple Wound Cleansing - one wound 1 5 []  - 0 Complex Wound Cleansing - multiple wounds X- 1 5 Wound Imaging (photographs - any number of  wounds) []  - 0 Wound Tracing (instead of photographs) X- 1 5 Simple Wound Measurement - one wound []  - 0 Complex Wound Measurement - multiple wounds INTERVENTIONS - Wound Dressings []  - Small Wound Dressing one or multiple wounds 0 X- 1 15 Medium Wound Dressing one or multiple wounds []  - 0 Large Wound Dressing one or multiple wounds []  - 0 Application of Medications - topical []  - 0 Application of Medications - injection INTERVENTIONS - Miscellaneous []  - External ear exam 0 []  - 0 Specimen Collection (cultures, biopsies, blood, body fluids, etc.) []  - 0 Specimen(s) / Culture(s) sent or taken to Lab for analysis []  - 0 Patient Transfer (multiple staff / Nurse, adultHoyer Lift / Similar devices) []  - 0 Simple Staple / Suture removal (25 or less) []  - 0 Complex Staple / Suture removal (26 or more) []  - 0 Hypo / Hyperglycemic Management (close monitor of Blood Glucose) []  - 0 Ankle / Brachial Index (ABI) - do not check if billed separately X- 1 5 Vital Signs [name], [name] ([id number]) Has the patient been seen at the hospital within the last three years: Yes Total Score: 80 Level Of Care: New/Established - Level 3 Electronic Signature(s) Signed: 03/07/2019 9:14:45 AM By: Elliot GurneyWoody, BSN, RN, CWS, Kim RN, BSN Entered By: Elliot GurneyWoody, BSN, RN, CWS, Kim on 03/06/2019 11:49:54 [name], [name] ([id number]) -------------------------------------------------------------------------------- Encounter Discharge Information Details Patient Name: [name], [name] Date of Service: 03/06/2019 11:00 AM Medical Record Number: [id number] Patient Account Number: [id number] Date of Birth/Sex: 04/29/1930 (85 y.o. F) Treating RN: Arnette NorrisBiell, Kristina Primary Care Kylie Gros: Aram BeechamSparks, Jeffrey Other Clinician: Referring  [place]: Aram BeechamSparks, Jeffrey Treating Angeles Zehner/Extender: Altamese CarolinaOBSON, [name] Weeks in Treatment: 6668 Encounter Discharge Information Items Discharge Condition: Stable Ambulatory Status: Walker Discharge Destination: Home Transportation: Private Auto Accompanied By: self Schedule Follow-up Appointment: Yes Clinical Summary of Care: Electronic Signature(s) Signed: 03/06/2019 12:04:55 PM By: Arnette NorrisBiell, Kristina Entered By: Arnette NorrisBiell, Kristina on 03/06/2019 12:04:54 [name], [name] ([id number]) -------------------------------------------------------------------------------- Lower Extremity Assessment Details Patient Name: [name], [name] Date of Service: 03/06/2019 11:00 AM Medical Record Number: [id number] Patient Account Number: [id number] Date of Birth/Sex: 01/05/1930 (85 y.o. F) Treating RN: Arnette NorrisBiell, Kristina Primary Care Montgomery Favor: Aram BeechamSparks, Jeffrey Other Clinician: Referring [place] Winslow: Aram BeechamSparks, Jeffrey Treating Gaston Dase/Extender: Maxwell CaulOBSON, [name] Weeks in Treatment: 1668 Electronic Signature(s) Signed: 03/07/2019 10:08:57 AM By: Arnette NorrisBiell, Kristina Entered By: Arnette NorrisBiell, Kristina on 03/06/2019 11:27:11 [name], [name] ([id number]) -------------------------------------------------------------------------------- Multi Wound Chart Details Patient Name: [name], [name] Date of Service: 03/06/2019 11:00 AM Medical Record Number: [id number] Patient Account Number: [id number] Date of Birth/Sex: 09/16/1930 (85 y.o. F) Treating RN: Huel CoventryWoody, Kim Primary Care Ligaya Cormier: Aram BeechamSparks, Jeffrey Other Clinician: Referring Payne Garske: Aram BeechamSparks, Jeffrey Treating Hiram Mciver/Extender: Altamese CarolinaOBSON, [name] Weeks in Treatment: 6268 Vital Signs Height(in): 60 Pulse(bpm): 70 Weight(lbs): 123 Blood Pressure(mmHg): 155/59 Body Mass Index(BMI): 24 Temperature(F): 98.0 Respiratory Rate 16 (breaths/min): Photos: [N/A:N/A] Wound Location: Left Elbow N/A N/A Wounding Event: Surgical Injury N/A N/A Primary Etiology: Open Surgical Wound N/A  N/A Comorbid History: Cataracts, Glaucoma, Optic N/A N/A Neuritis, Chronic sinus problems/congestion, Middle ear problems Date Acquired: 01/26/2018 N/A N/A Weeks of Treatment: 44 N/A N/A Wound Status: Open N/A N/A Measurements L x W x D 1x0.7x0.5 N/A N/A (cm) Area (cm) : 0.55 N/A N/A Volume (cm) : 0.275 N/A N/A % Reduction in Area: -674.60% N/A N/A % Reduction in Volume: -1864.30% N/A N/A Starting Position 1 6 (o'clock): Ending Position 1 12 (o'clock): Maximum Distance 1 (cm): 0.8 Undermining: Yes N/A N/A Classification: Full Thickness With Exposed N/A  N/A Support Structures Exudate Amount: Medium N/A N/A Exudate Type: Purulent N/A N/A Exudate Color: yellow, brown, green N/A N/A Wound Margin: Distinct, outline attached N/A N/A Granulation Amount: None Present (0%) N/A N/A JACKEE, BOREY ([id number]) Necrotic Amount: Large (67-100%) N/A N/A Exposed Structures: Fat Layer (Subcutaneous N/A N/A Tissue) Exposed: Yes Fascia: No Tendon: No Muscle: No Joint: No Bone: No Epithelialization: None N/A N/A Periwound Skin Texture: Excoriation: No N/A N/A Induration: No Callus: No Crepitus: No Rash: No Scarring: No Periwound Skin Moisture: Maceration: Yes N/A N/A Dry/Scaly: No Periwound Skin Color: Erythema: Yes N/A N/A Atrophie Blanche: No Cyanosis: No Ecchymosis: No Hemosiderin Staining: No Mottled: No Pallor: No Rubor: No Erythema Location: Circumferential N/A N/A Temperature: No Abnormality N/A N/A Tenderness on Palpation: Yes N/A N/A Wound Preparation: Ulcer Cleansing: N/A N/A Rinsed/Irrigated with Saline Topical Anesthetic Applied: None, Other: lidocaine 4% Treatment Notes Wound #9 (Left Elbow) Notes Silver alginate, BFD Electronic Signature(s) Signed: 03/06/2019 5:18:51 PM By: Baltazar Najjar MD Entered By: Baltazar Najjar on 03/06/2019 12:44:39 [name]  ([id number]) -------------------------------------------------------------------------------- Multi-Disciplinary Care Plan Details Patient Name: [name] Date of Service: 03/06/2019 11:00 AM Medical Record Number: [id number] Patient Account Number: [id number] Date of Birth/Sex: 12/23/1929 (85 y.o. F) Treating RN: Huel Coventry Primary Care Meagen Limones: Aram Beecham Other Clinician: Referring Guynell Kleiber: Aram Beecham Treating Oryon Gary/Extender: Altamese [place] in Treatment: 73 Active Inactive Soft Tissue Infection Nursing Diagnoses: Impaired tissue integrity Goals: Patient's soft tissue infection will resolve Date Initiated: 07/18/2018 Target Resolution Date: 07/27/2018 Goal Status: Active Interventions: Assess signs and symptoms of infection every visit Treatment Activities: Culture and sensitivity : 07/18/2018 Systemic antibiotics : 07/18/2018 Notes: Wound/Skin Impairment Nursing Diagnoses: Impaired tissue integrity Knowledge deficit related to ulceration/compromised skin integrity Goals: Patient/caregiver will verbalize understanding of skin care regimen Date Initiated: 11/14/2017 Target Resolution Date: 11/28/2017 Goal Status: Active Ulcer/skin breakdown will have a volume reduction of 30% by week 4 Date Initiated: 11/14/2017 Target Resolution Date: 11/28/2017 Goal Status: Active Interventions: Assess patient/caregiver ability to obtain necessary supplies Assess patient/caregiver ability to perform ulcer/skin care regimen upon admission and as needed Assess ulceration(s) every visit Treatment Activities: Skin care regimen initiated : 11/14/2017 ANJELIQUE, LUNDY ([id number]) Notes: Electronic Signature(s) Signed: 03/07/2019 9:14:45 AM By: Elliot Gurney, BSN, RN, CWS, Kim RN, BSN Entered By: Elliot Gurney, BSN, RN, CWS, Kim on 03/06/2019 11:46:14 [name] ([id number]) -------------------------------------------------------------------------------- Pain Assessment  Details Patient Name: [name] Date of Service: 03/06/2019 11:00 AM Medical Record Number: [id number] Patient Account Number: [id number] Date of Birth/Sex: Apr 06, 1930 (85 y.o. F) Treating RN: Arnette Norris Primary Care Denna Fryberger: Aram Beecham Other Clinician: Referring Markeith Jue: Aram Beecham Treating Nyesha Cliff/Extender: Altamese [place] in Treatment: 50 Active Problems Location of Pain Severity and Description of Pain Patient Has Paino No Site Locations Pain Management and Medication Current Pain Management: Electronic Signature(s) Signed: 03/07/2019 10:08:57 AM By: Arnette Norris Entered By: Arnette Norris on 03/06/2019 11:14:36 [name] ([id number]) -------------------------------------------------------------------------------- Patient/Caregiver Education Details Patient Name: [name] Date of Service: 03/06/2019 11:00 AM Medical Record Number: [id number] Patient Account Number: [id number] Date of Birth/Gender: 07-14-1930 (85 y.o. F) Treating RN: Huel Coventry Primary Care Physician: Aram Beecham Other Clinician: Referring Physician: Aram Beecham Treating Physician/Extender: Altamese [place] in Treatment: 55 Education Assessment Education Provided To: Patient Education Topics Provided Wound/Skin Impairment: Handouts: Caring for Your Ulcer Methods: Demonstration, Explain/Verbal Responses: State content correctly Electronic Signature(s) Signed: 03/07/2019 9:14:45 AM By: Elliot Gurney, BSN, RN, CWS, Kim RN, BSN Entered By: Elliot Gurney, BSN, RN, CWS,  Kim on 03/06/2019 11:50:12 [name], Valincia V. ([id number]) -------------------------------------------------------------------------------- Wound Assessment Details Patient Name: [name], Miata V. Date of Service: 03/06/2019 11:00 AM Medical Record Number: [id number] Patient Account Number: [id number] Date of Birth/Sex: 08/22/1930 (85 y.o. F) Treating RN: Arnette NorrisBiell, Kristina Primary Care Breyah Akhter: Aram BeechamSparks,  Jeffrey Other Clinician: Referring Kamela Blansett: Aram BeechamSparks, Jeffrey Treating Chayanne Speir/Extender: Altamese CarolinaOBSON, [name] Weeks in Treatment: 8568 Wound Status Wound Number: 9 Primary Open Surgical Wound Etiology: Wound Location: Left Elbow Wound Open Wounding Event: Surgical Injury Status: Date Acquired: 01/26/2018 Comorbid Cataracts, Glaucoma, Optic Neuritis, Chronic Weeks Of Treatment: 44 History: sinus problems/congestion, Middle ear problems Clustered Wound: No Photos Wound Measurements Length: (cm) 1 Width: (cm) 0.7 Depth: (cm) 0.5 Area: (cm) 0.55 Volume: (cm) 0.275 % Reduction in Area: -674.6% % Reduction in Volume: -1864.3% Epithelialization: None Tunneling: No Undermining: Yes Starting Position (o'clock): 6 Ending Position (o'clock): 12 Maximum Distance: (cm) 0.8 Wound Description Full Thickness With Exposed Support Foul Odo Classification: Structures Slough/F Wound Margin: Distinct, outline attached Exudate Medium Amount: Exudate Type: Purulent Exudate Color: yellow, brown, green r After Cleansing: No ibrino Yes Wound Bed Granulation Amount: None Present (0%) Exposed Structure Necrotic Amount: Large (67-100%) Fascia Exposed: No Necrotic Quality: Adherent Slough Fat Layer (Subcutaneous Tissue) Exposed: Yes Tendon Exposed: No [name], Timothy V. ([id number]) Muscle Exposed: No Joint Exposed: No Bone Exposed: No Periwound Skin Texture Texture Color No Abnormalities Noted: No No Abnormalities Noted: No Callus: No Atrophie Blanche: No Crepitus: No Cyanosis: No Excoriation: No Ecchymosis: No Induration: No Erythema: Yes Rash: No Erythema Location: Circumferential Scarring: No Hemosiderin Staining: No Mottled: No Moisture Pallor: No No Abnormalities Noted: No Rubor: No Dry / Scaly: No Maceration: Yes Temperature / Pain Temperature: No Abnormality Tenderness on Palpation: Yes Wound Preparation Ulcer Cleansing: Rinsed/Irrigated with Saline Topical  Anesthetic Applied: None, Other: lidocaine 4%, Treatment Notes Wound #9 (Left Elbow) Notes Silver alginate, BFD Electronic Signature(s) Signed: 03/07/2019 10:08:57 AM By: Arnette NorrisBiell, Kristina Entered By: Arnette NorrisBiell, Kristina on 03/06/2019 11:26:05 [name], Tawona V. ([id number]) -------------------------------------------------------------------------------- Vitals Details Patient Name: [name], Anjanette V. Date of Service: 03/06/2019 11:00 AM Medical Record Number: [id number] Patient Account Number: [id number] Date of Birth/Sex: 10/10/1930 (85 y.o. F) Treating RN: Arnette NorrisBiell, Kristina Primary Care Brigham Cobbins: Aram BeechamSparks, Jeffrey Other Clinician: Referring Pascha Fogal: Aram BeechamSparks, Jeffrey Treating Flannery Cavallero/Extender: Altamese CarolinaOBSON, [name] Weeks in Treatment: 6168 Vital Signs Time Taken: 11:10 Temperature (F): 98.0 Height (in): 60 Pulse (bpm): 70 Weight (lbs): 123 Respiratory Rate (breaths/min): 16 Body Mass Index (BMI): 24 Blood Pressure (mmHg): 155/59 Reference Range: 80 - 120 mg / dl Electronic Signature(s) Signed: 03/07/2019 10:08:57 AM By: Arnette NorrisBiell, Kristina Entered By: Arnette NorrisBiell, Kristina on 03/06/2019 11:16:45

## 2019-03-08 ENCOUNTER — Other Ambulatory Visit: Payer: Self-pay

## 2019-03-08 DIAGNOSIS — L97905 Non-pressure chronic ulcer of unspecified part of unspecified lower leg with muscle involvement without evidence of necrosis: Secondary | ICD-10-CM | POA: Diagnosis not present

## 2019-03-08 NOTE — Progress Notes (Signed)
[name], [name] ([id number]) Visit Report for 03/08/2019 Arrival Information Details Patient Name: [name], [name] Date of Service: 03/08/2019 10:30 AM Medical Record Number: [id number] Patient Account Number: [id number] Date of Birth/Sex: 11/24/1930 (85 y.o. F) Treating RN: Rodell PernaScott, Dajea Primary Care Accalia Rigdon: Aram BeechamSparks, Jeffrey Other Clinician: Referring Detrick Dani: Aram BeechamSparks, Jeffrey Treating Ellieanna Funderburg/Extender: Linwood DibblesSTONE III, HOYT Weeks in Treatment: 4268 Visit Information History Since Last Visit Added or deleted any medications: No Patient Arrived: Walker Any new allergies or adverse reactions: No Arrival Time: 10:54 Had a fall or experienced change in No Accompanied By: self activities of daily living that may affect Transfer Assistance: None risk of falls: Patient Requires Transmission-Based No Signs or symptoms of abuse/neglect since last visito No Precautions: Hospitalized since last visit: No Patient Has Alerts: Yes Implantable device outside of the clinic excluding No Patient Alerts: non compressible left cellular tissue based products placed in the center leg since last visit: Has Dressing in Place as Prescribed: Yes Pain Present Now: No Electronic Signature(s) Signed: 03/08/2019 11:52:41 AM By: Rodell PernaScott, Dajea Entered By: Rodell PernaScott, Dajea on 03/08/2019 10:54:44 [name], [name] ([id number]) -------------------------------------------------------------------------------- Clinic Level of Care Assessment Details Patient Name: [name], [name] Date of Service: 03/08/2019 10:30 AM Medical Record Number: [id number] Patient Account Number: [id number] Date of Birth/Sex: 05/10/1930 (85 y.o. F) Treating RN: Rodell PernaScott, Dajea Primary Care Rehema Muffley: Aram BeechamSparks, Jeffrey Other Clinician: Referring Benjamine Strout: Aram BeechamSparks, Jeffrey Treating Branda Chaudhary/Extender: Linwood DibblesSTONE III, HOYT Weeks in Treatment: 2668 Clinic Level of Care Assessment Items TOOL 4 Quantity Score []  - Use when only an EandM is performed on FOLLOW-UP  visit 0 ASSESSMENTS - Nursing Assessment / Reassessment X - Reassessment of Co-morbidities (includes updates in patient status) 1 10 X- 1 5 Reassessment of Adherence to Treatment Plan ASSESSMENTS - Wound and Skin Assessment / Reassessment X - Simple Wound Assessment / Reassessment - one wound 1 5 []  - 0 Complex Wound Assessment / Reassessment - multiple wounds []  - 0 Dermatologic / Skin Assessment (not related to wound area) ASSESSMENTS - Focused Assessment []  - Circumferential Edema Measurements - multi extremities 0 []  - 0 Nutritional Assessment / Counseling / Intervention []  - 0 Lower Extremity Assessment (monofilament, tuning fork, pulses) []  - 0 Peripheral Arterial Disease Assessment (using hand held doppler) ASSESSMENTS - Ostomy and/or Continence Assessment and Care []  - Incontinence Assessment and Management 0 []  - 0 Ostomy Care Assessment and Management (repouching, etc.) PROCESS - Coordination of Care X - Simple Patient / Family Education for ongoing care 1 15 []  - 0 Complex (extensive) Patient / Family Education for ongoing care []  - 0 Staff obtains ChiropractorConsents, Records, Test Results / Process Orders []  - 0 Staff telephones HHA, Nursing Homes / Clarify orders / etc []  - 0 Routine Transfer to another Facility (non-emergent condition) []  - 0 Routine Hospital Admission (non-emergent condition) []  - 0 New Admissions / Manufacturing engineernsurance Authorizations / Ordering NPWT, Apligraf, etc. []  - 0 Emergency Hospital Admission (emergent condition) X- 1 10 Simple Discharge Coordination [name], Neylan V. ([id number]) []  - 0 Complex (extensive) Discharge Coordination PROCESS - Special Needs []  - Pediatric / Minor Patient Management 0 []  - 0 Isolation Patient Management []  - 0 Hearing / Language / Visual special needs []  - 0 Assessment of Community assistance (transportation, D/C planning, etc.) []  - 0 Additional assistance / Altered mentation []  - 0 Support Surface(s) Assessment  (bed, cushion, seat, etc.) INTERVENTIONS - Wound Cleansing / Measurement X - Simple Wound Cleansing - one wound 1 5 []  - 0 Complex Wound Cleansing - multiple  wounds X- 1 5 Wound Imaging (photographs - any number of wounds) []  - 0 Wound Tracing (instead of photographs) X- 1 5 Simple Wound Measurement - one wound []  - 0 Complex Wound Measurement - multiple wounds INTERVENTIONS - Wound Dressings X - Small Wound Dressing one or multiple wounds 1 10 []  - 0 Medium Wound Dressing one or multiple wounds []  - 0 Large Wound Dressing one or multiple wounds []  - 0 Application of Medications - topical []  - 0 Application of Medications - injection INTERVENTIONS - Miscellaneous []  - External ear exam 0 []  - 0 Specimen Collection (cultures, biopsies, blood, body fluids, etc.) []  - 0 Specimen(s) / Culture(s) sent or taken to Lab for analysis []  - 0 Patient Transfer (multiple staff / Nurse, adultHoyer Lift / Similar devices) []  - 0 Simple Staple / Suture removal (25 or less) []  - 0 Complex Staple / Suture removal (26 or more) []  - 0 Hypo / Hyperglycemic Management (close monitor of Blood Glucose) []  - 0 Ankle / Brachial Index (ABI) - do not check if billed separately []  - 0 Vital Signs [name], [name] ([id number]) Has the patient been seen at the hospital within the last three years: Yes Total Score: 70 Level Of Care: New/Established - Level 2 Electronic Signature(s) Signed: 03/08/2019 11:52:41 AM By: Rodell PernaScott, Dajea Entered By: Rodell PernaScott, Dajea on 03/08/2019 10:58:49 [name], [name] ([id number]) -------------------------------------------------------------------------------- Encounter Discharge Information Details Patient Name: [name], [name] Date of Service: 03/08/2019 10:30 AM Medical Record Number: [id number] Patient Account Number: [id number] Date of Birth/Sex: 02/18/1930 (85 y.o. F) Treating RN: Rodell PernaScott, Dajea Primary Care Saraia Platner: Aram BeechamSparks, Jeffrey Other Clinician: Referring Zofia Peckinpaugh: Aram BeechamSparks,  Jeffrey Treating Cutberto Winfree/Extender: Linwood DibblesSTONE III, HOYT Weeks in Treatment: 5068 Encounter Discharge Information Items Discharge Condition: Stable Ambulatory Status: Walker Discharge Destination: Home Transportation: Private Auto Accompanied By: self Schedule Follow-up Appointment: Yes Clinical Summary of Care: Electronic Signature(s) Signed: 03/08/2019 11:52:41 AM By: Rodell PernaScott, Dajea Entered By: Rodell PernaScott, Dajea on 03/08/2019 10:58:17 [name], [name] ([id number]) -------------------------------------------------------------------------------- Wound Assessment Details Patient Name: [name], Mitchell V. Date of Service: 03/08/2019 10:30 AM Medical Record Number: [id number] Patient Account Number: [id number] Date of Birth/Sex: 04/12/1930 (85 y.o. F) Treating RN: Rodell PernaScott, Dajea Primary Care Maddie Brazier: Aram BeechamSparks, Jeffrey Other Clinician: Referring Larri Yehle: Aram BeechamSparks, Jeffrey Treating Latice Waitman/Extender: Linwood DibblesSTONE III, HOYT Weeks in Treatment: 2868 Wound Status Wound Number: 9 Primary Open Surgical Wound Etiology: Wound Location: Left Elbow Wound Open Wounding Event: Surgical Injury Status: Date Acquired: 01/26/2018 Comorbid Cataracts, Glaucoma, Optic Neuritis, Chronic Weeks Of Treatment: 45 History: sinus problems/congestion, Middle ear Clustered Wound: No problems Photos Photo Uploaded By: Rodell PernaScott, Dajea on 03/08/2019 11:23:57 Wound Measurements Length: (cm) 1 Width: (cm) 0.4 Depth: (cm) 0.3 Area: (cm) 0.314 Volume: (cm) 0.094 % Reduction in Area: -342.3% % Reduction in Volume: -571.4% Epithelialization: None Tunneling: No Undermining: No Wound Description Full Thickness With Exposed Support Classification: Structures Wound Margin: Distinct, outline attached Exudate Medium Amount: Exudate Type: Purulent Exudate Color: yellow, brown, green Foul Odor After Cleansing: No Slough/Fibrino Yes Wound Bed Granulation Amount: None Present (0%) Exposed Structure Necrotic Amount: Large  (67-100%) Fascia Exposed: No Necrotic Quality: Adherent Slough Fat Layer (Subcutaneous Tissue) Exposed: Yes Tendon Exposed: No Muscle Exposed: No Joint Exposed: No Bone Exposed: No Candiss NorseHENRY, Desaree V. ([id number]) Periwound Skin Texture Texture Color No Abnormalities Noted: No No Abnormalities Noted: No Callus: No Atrophie Blanche: No Crepitus: No Cyanosis: No Excoriation: No Ecchymosis: No Induration: No Erythema: Yes Rash: No Erythema Location: Circumferential Scarring: No Hemosiderin Staining: No Mottled: No Moisture Pallor: No No  Abnormalities Noted: No Rubor: No Dry / Scaly: No Maceration: Yes Temperature / Pain Temperature: No Abnormality Tenderness on Palpation: Yes Wound Preparation Ulcer Cleansing: Rinsed/Irrigated with Saline Topical Anesthetic Applied: None, Other: lidocaine 4%, Treatment Notes Wound #9 (Left Elbow) Notes Silver alginate, BFD Electronic Signature(s) Signed: 03/08/2019 11:52:41 AM By: Rodell Perna Entered By: Rodell Perna on 03/08/2019 10:57:31

## 2019-03-10 LAB — AEROBIC CULTURE  (SUPERFICIAL SPECIMEN)

## 2019-03-10 LAB — AEROBIC CULTURE W GRAM STAIN (SUPERFICIAL SPECIMEN)

## 2019-03-11 ENCOUNTER — Ambulatory Visit: Payer: Medicare Other

## 2019-03-12 ENCOUNTER — Other Ambulatory Visit
Admission: RE | Admit: 2019-03-12 | Discharge: 2019-03-12 | Disposition: A | Discharge status: Home / Self Care | Payer: Medicare Other | Source: Ambulatory Visit | Attending: Internal Medicine | Admitting: Internal Medicine

## 2019-03-20 ENCOUNTER — Encounter: Payer: Medicare Other | Attending: Internal Medicine | Admitting: Internal Medicine

## 2019-03-20 ENCOUNTER — Other Ambulatory Visit: Payer: Self-pay

## 2019-03-20 DIAGNOSIS — I10 Essential (primary) hypertension: Secondary | ICD-10-CM | POA: Diagnosis not present

## 2019-03-20 DIAGNOSIS — M1990 Unspecified osteoarthritis, unspecified site: Secondary | ICD-10-CM | POA: Diagnosis not present

## 2019-03-20 DIAGNOSIS — S51002S Unspecified open wound of left elbow, sequela: Secondary | ICD-10-CM | POA: Diagnosis not present

## 2019-03-20 DIAGNOSIS — L97812 Non-pressure chronic ulcer of other part of right lower leg with fat layer exposed: Secondary | ICD-10-CM | POA: Diagnosis not present

## 2019-03-20 DIAGNOSIS — Z950 Presence of cardiac pacemaker: Secondary | ICD-10-CM | POA: Insufficient documentation

## 2019-03-20 DIAGNOSIS — W228XXS Striking against or struck by other objects, sequela: Secondary | ICD-10-CM | POA: Insufficient documentation

## 2019-03-20 DIAGNOSIS — L98495 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: Secondary | ICD-10-CM | POA: Insufficient documentation

## 2019-03-20 DIAGNOSIS — M069 Rheumatoid arthritis, unspecified: Secondary | ICD-10-CM | POA: Insufficient documentation

## 2019-03-20 NOTE — Progress Notes (Signed)
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

## 2019-03-20 NOTE — Progress Notes (Signed)
SHINITA, NICOLE ([id number]) Visit Report for 03/20/2019 Arrival Information Details Patient Name: [name], [name] Date of Service: 03/20/2019 10:45 AM Medical Record Number: [id number] Patient Account Number: [id number] Date of Birth/Sex: 01/17/1930 (85 y.o. F) Treating RN: Curtis Sites Primary Care Rebecka Oelkers: Aram Beecham Other Clinician: Referring Jodette Wik: Aram Beecham Treating [place] Bowlds/Extender: Altamese [place] in Treatment: 18 Visit Information History Since Last Visit Added or deleted any medications: No Patient Arrived: Walker Any new allergies or adverse reactions: No Arrival Time: 11:10 Had a fall or experienced change in No Accompanied By: self activities of daily living that may affect Transfer Assistance: None risk of falls: Patient Identification Verified: Yes Signs or symptoms of abuse/neglect since last visito No Secondary Verification Process Yes Hospitalized since last visit: No Completed: Implantable device outside of the clinic excluding No Patient Requires Transmission-Based No cellular tissue based products placed in the center Precautions: since last visit: Patient Has Alerts: Yes Has Dressing in Place as Prescribed: Yes Patient Alerts: non compressible left Pain Present Now: No leg Electronic Signature(s) Signed: 03/20/2019 2:47:29 PM By: Curtis Sites Entered By: Curtis Sites on 03/20/2019 11:11:09 [name] ([id number]) -------------------------------------------------------------------------------- Clinic Level of Care Assessment Details Patient Name: [name] Date of Service: 03/20/2019 10:45 AM Medical Record Number: [id number] Patient Account Number: [id number] Date of Birth/Sex: July 23, 1930 (85 y.o. F) Treating RN: Huel Coventry Primary Care Myrick Mcnairy: Aram Beecham Other Clinician: Referring Maelle Sheaffer: Aram Beecham Treating Marigene Erler/Extender: Altamese [place] in Treatment: 18 Clinic Level of Care Assessment  Items TOOL 4 Quantity Score []  - Use when only an EandM is performed on FOLLOW-UP visit 0 ASSESSMENTS - Nursing Assessment / Reassessment []  - Reassessment of Co-morbidities (includes updates in patient status) 0 X- 1 5 Reassessment of Adherence to Treatment Plan ASSESSMENTS - Wound and Skin Assessment / Reassessment X - Simple Wound Assessment / Reassessment - one wound 1 5 []  - 0 Complex Wound Assessment / Reassessment - multiple wounds []  - 0 Dermatologic / Skin Assessment (not related to wound area) ASSESSMENTS - Focused Assessment []  - Circumferential Edema Measurements - multi extremities 0 []  - 0 Nutritional Assessment / Counseling / Intervention []  - 0 Lower Extremity Assessment (monofilament, tuning fork, pulses) []  - 0 Peripheral Arterial Disease Assessment (using hand held doppler) ASSESSMENTS - Ostomy and/or Continence Assessment and Care []  - Incontinence Assessment and Management 0 []  - 0 Ostomy Care Assessment and Management (repouching, etc.) PROCESS - Coordination of Care X - Simple Patient / Family Education for ongoing care 1 15 []  - 0 Complex (extensive) Patient / Family Education for ongoing care X- 1 10 Staff obtains Chiropractor, Records, Test Results / Process Orders []  - 0 Staff telephones HHA, Nursing Homes / Clarify orders / etc []  - 0 Routine Transfer to another Facility (non-emergent condition) []  - 0 Routine Hospital Admission (non-emergent condition) []  - 0 New Admissions / Manufacturing engineer / Ordering NPWT, Apligraf, etc. []  - 0 Emergency Hospital Admission (emergent condition) X- 1 10 Simple Discharge Coordination JAYLEA, TAPANI ([id number]) []  - 0 Complex (extensive) Discharge Coordination PROCESS - Special Needs []  - Pediatric / Minor Patient Management 0 []  - 0 Isolation Patient Management []  - 0 Hearing / Language / Visual special needs []  - 0 Assessment of Community assistance (transportation, D/C planning, etc.) []  -  0 Additional assistance / Altered mentation []  - 0 Support Surface(s) Assessment (bed, cushion, seat, etc.) INTERVENTIONS - Wound Cleansing / Measurement X - Simple Wound Cleansing - one wound 1 5 []  -  0 Complex Wound Cleansing - multiple wounds X- 1 5 Wound Imaging (photographs - any number of wounds) []  - 0 Wound Tracing (instead of photographs) X- 1 5 Simple Wound Measurement - one wound []  - 0 Complex Wound Measurement - multiple wounds INTERVENTIONS - Wound Dressings X - Small Wound Dressing one or multiple wounds 1 10 []  - 0 Medium Wound Dressing one or multiple wounds []  - 0 Large Wound Dressing one or multiple wounds []  - 0 Application of Medications - topical []  - 0 Application of Medications - injection INTERVENTIONS - Miscellaneous []  - External ear exam 0 []  - 0 Specimen Collection (cultures, biopsies, blood, body fluids, etc.) []  - 0 Specimen(s) / Culture(s) sent or taken to Lab for analysis []  - 0 Patient Transfer (multiple staff / Nurse, adultHoyer Lift / Similar devices) []  - 0 Simple Staple / Suture removal (25 or less) []  - 0 Complex Staple / Suture removal (26 or more) []  - 0 Hypo / Hyperglycemic Management (close monitor of Blood Glucose) []  - 0 Ankle / Brachial Index (ABI) - do not check if billed separately X- 1 5 Vital Signs [name], [name] ([id number]) Has the patient been seen at the hospital within the last three years: Yes Total Score: 75 Level Of Care: New/Established - Level 2 Electronic Signature(s) Signed: 03/20/2019 4:47:28 PM By: Elliot GurneyWoody, BSN, RN, CWS, Kim RN, BSN Entered By: Elliot GurneyWoody, BSN, RN, CWS, Kim on 03/20/2019 11:40:42 [name], [name] ([id number]) -------------------------------------------------------------------------------- Encounter Discharge Information Details Patient Name: [name], [name] Date of Service: 03/20/2019 10:45 AM Medical Record Number: [id number] Patient Account Number: [id number] Date of Birth/Sex: 07/27/1930 (85 y.o.  F) Treating RN: Rodell PernaScott, Dajea Primary Care Mystery Schrupp: Aram BeechamSparks, Jeffrey Other Clinician: Referring Tanyon Alipio: Aram BeechamSparks, Jeffrey Treating Louna Rothgeb/Extender: Altamese CarolinaOBSON, [name] Weeks in Treatment: 7870 Encounter Discharge Information Items Discharge Condition: Stable Ambulatory Status: Walker Discharge Destination: Home Transportation: Private Auto Accompanied By: self Schedule Follow-up Appointment: Yes Clinical Summary of Care: Electronic Signature(s) Signed: 03/20/2019 12:01:06 PM By: Rodell PernaScott, Dajea Entered By: Rodell PernaScott, Dajea on 03/20/2019 11:57:14 [name], Cristle V. ([id number]) -------------------------------------------------------------------------------- Lower Extremity Assessment Details Patient Name: [name], [name] Date of Service: 03/20/2019 10:45 AM Medical Record Number: [id number] Patient Account Number: [id number] Date of Birth/Sex: 10/22/1930 (85 y.o. F) Treating RN: Curtis Sitesorthy, Joanna Primary Care Ricci Dirocco: Aram BeechamSparks, Jeffrey Other Clinician: Referring Mariajose Mow: Aram BeechamSparks, Jeffrey Treating Lun Muro/Extender: Altamese CarolinaOBSON, [name] Weeks in Treatment: 8370 Electronic Signature(s) Signed: 03/20/2019 2:47:29 PM By: Curtis Sitesorthy, Joanna Entered By: Curtis Sitesorthy, Joanna on 03/20/2019 11:16:50 [name], [name] ([id number]) -------------------------------------------------------------------------------- Multi Wound Chart Details Patient Name: [name], [name] Date of Service: 03/20/2019 10:45 AM Medical Record Number: [id number] Patient Account Number: [id number] Date of Birth/Sex: 08/13/1930 (85 y.o. F) Treating RN: Huel CoventryWoody, Kim Primary Care Daanish Copes: Aram BeechamSparks, Jeffrey Other Clinician: Referring Jamire Shabazz: Aram BeechamSparks, Jeffrey Treating Hoorain Kozakiewicz/Extender: Altamese CarolinaOBSON, [name] Weeks in Treatment: 470 Vital Signs Height(in): 60 Pulse(bpm): 67 Weight(lbs): 123 Blood Pressure(mmHg): 127/52 Body Mass Index(BMI): 24 Temperature(F): 97.9 Respiratory Rate 16 (breaths/min): Photos: [N/A:N/A] Wound Location: Left Elbow N/A  N/A Wounding Event: Surgical Injury N/A N/A Primary Etiology: Open Surgical Wound N/A N/A Comorbid History: Cataracts, Glaucoma, Optic N/A N/A Neuritis, Chronic sinus problems/congestion, Middle ear problems Date Acquired: 01/26/2018 N/A N/A Weeks of Treatment: 46 N/A N/A Wound Status: Open N/A N/A Measurements L x W x D 1x0.5x0.3 N/A N/A (cm) Area (cm) : 0.393 N/A N/A Volume (cm) : 0.118 N/A N/A % Reduction in Area: -453.50% N/A N/A % Reduction in Volume: -742.90% N/A N/A Starting Position 1 5 (o'clock): Ending Position  1 11 (o'clock): Maximum Distance 1 (cm): 0.4 Undermining: Yes N/A N/A Classification: Full Thickness With Exposed N/A N/A Support Structures Exudate Amount: Medium N/A N/A Exudate Type: Purulent N/A N/A Exudate Color: yellow, brown, green N/A N/A Wound Margin: Distinct, outline attached N/A N/A Granulation Amount: Medium (34-66%) N/A N/A KENDRICK, CAPLIN ([id number]) Granulation Quality: Pale, Hyper-granulation N/A N/A Necrotic Amount: Medium (34-66%) N/A N/A Exposed Structures: Fat Layer (Subcutaneous N/A N/A Tissue) Exposed: Yes Fascia: No Tendon: No Muscle: No Joint: No Bone: No Epithelialization: None N/A N/A Periwound Skin Texture: Excoriation: No N/A N/A Induration: No Callus: No Crepitus: No Rash: No Scarring: No Periwound Skin Moisture: Maceration: Yes N/A N/A Dry/Scaly: No Periwound Skin Color: Erythema: Yes N/A N/A Atrophie Blanche: No Cyanosis: No Ecchymosis: No Hemosiderin Staining: No Mottled: No Pallor: No Rubor: No Erythema Location: Circumferential N/A N/A Temperature: No Abnormality N/A N/A Tenderness on Palpation: Yes N/A N/A Treatment Notes Wound #9 (Left Elbow) Notes medihoney, BFD Electronic Signature(s) Signed: 03/20/2019 3:44:34 PM By: Baltazar Najjar MD Entered By: Baltazar Najjar on 03/20/2019 12:59:06 [name]  ([id number]) -------------------------------------------------------------------------------- Multi-Disciplinary Care Plan Details Patient Name: [name] Date of Service: 03/20/2019 10:45 AM Medical Record Number: [id number] Patient Account Number: [id number] Date of Birth/Sex: August 26, 1930 (85 y.o. F) Treating RN: Huel Coventry Primary Care Birdell Frasier: Aram Beecham Other Clinician: Referring Brenna Friesenhahn: Aram Beecham Treating Bernardino Dowell/Extender: Altamese [place] in Treatment: 69 Active Inactive Soft Tissue Infection Nursing Diagnoses: Impaired tissue integrity Goals: Patient's soft tissue infection will resolve Date Initiated: 07/18/2018 Target Resolution Date: 07/27/2018 Goal Status: Active Interventions: Assess signs and symptoms of infection every visit Treatment Activities: Culture and sensitivity : 07/18/2018 Systemic antibiotics : 07/18/2018 Notes: Wound/Skin Impairment Nursing Diagnoses: Impaired tissue integrity Knowledge deficit related to ulceration/compromised skin integrity Goals: Patient/caregiver will verbalize understanding of skin care regimen Date Initiated: 11/14/2017 Target Resolution Date: 11/28/2017 Goal Status: Active Ulcer/skin breakdown will have a volume reduction of 30% by week 4 Date Initiated: 11/14/2017 Target Resolution Date: 11/28/2017 Goal Status: Active Interventions: Assess patient/caregiver ability to obtain necessary supplies Assess patient/caregiver ability to perform ulcer/skin care regimen upon admission and as needed Assess ulceration(s) every visit Treatment Activities: Skin care regimen initiated : 11/14/2017 [name], [name] ([id number]) Notes: Electronic Signature(s) Signed: 03/20/2019 4:47:28 PM By: Elliot Gurney, BSN, RN, CWS, Kim RN, BSN Entered By: Elliot Gurney, BSN, RN, CWS, Kim on 03/20/2019 11:35:19 [name] ([id number]) -------------------------------------------------------------------------------- Pain Assessment  Details Patient Name: [name] Date of Service: 03/20/2019 10:45 AM Medical Record Number: [id number] Patient Account Number: [id number] Date of Birth/Sex: May 08, 1930 (85 y.o. F) Treating RN: Curtis Sites Primary Care Karelyn Brisby: Aram Beecham Other Clinician: Referring Judiann Celia: Aram Beecham Treating Suzanna Zahn/Extender: Altamese [place] in Treatment: 79 Active Problems Location of Pain Severity and Description of Pain Patient Has Paino Yes Site Locations Pain Location: Pain in Ulcers With Dressing Change: Yes Duration of the Pain. Constant / Intermittento Intermittent Pain Management and Medication Current Pain Management: Electronic Signature(s) Signed: 03/20/2019 2:47:29 PM By: Curtis Sites Entered By: Curtis Sites on 03/20/2019 11:11:29 [name] ([id number]) -------------------------------------------------------------------------------- Patient/Caregiver Education Details Patient Name: [name] Date of Service: 03/20/2019 10:45 AM Medical Record Number: [id number] Patient Account Number: [id number] Date of Birth/Gender: [birth year] (85 y.o. F) Treating RN: Huel Coventry Primary Care Physician: Aram Beecham Other Clinician: Referring Physician: Aram Beecham Treating Physician/Extender: Altamese [place] in Treatment: 70 Education Assessment Education Provided To: Patient Education Topics Provided Wound/Skin Impairment: Handouts: Caring for Your Ulcer Methods: Demonstration, Explain/Verbal Responses:  State content correctly Electronic Signature(s) Signed: 03/20/2019 4:47:28 PM By: Elliot Gurney, BSN, RN, CWS, Kim RN, BSN Entered By: Elliot Gurney, BSN, RN, CWS, Kim on 03/20/2019 11:41:12 [name] ([id number]) -------------------------------------------------------------------------------- Wound Assessment Details Patient Name: [name], [name] Date of Service: 03/20/2019 10:45 AM Medical Record Number: [id number] Patient Account Number:  [id number] Date of Birth/Sex: [birth year] (85 y.o. F) Treating RN: Curtis Sites Primary Care Gabrielle Mester: Aram Beecham Other Clinician: Referring Tyshea Imel: Aram Beecham Treating Manuel Lawhead/Extender: Altamese [place] in Treatment: 70 Wound Status Wound Number: 9 Primary Open Surgical Wound Etiology: Wound Location: Left Elbow Wound Open Wounding Event: Surgical Injury Status: Date Acquired: 01/26/2018 Comorbid Cataracts, Glaucoma, Optic Neuritis, Chronic Weeks Of Treatment: 46 History: sinus problems/congestion, Middle ear problems Clustered Wound: No Photos Wound Measurements Length: (cm) 1 Width: (cm) 0.5 Depth: (cm) 0.3 Area: (cm) 0.393 Volume: (cm) 0.118 % Reduction in Area: -453.5% % Reduction in Volume: -742.9% Epithelialization: None Tunneling: No Undermining: Yes Starting Position (o'clock): 5 Ending Position (o'clock): 11 Maximum Distance: (cm) 0.4 Wound Description Full Thickness With Exposed Support Classification: Structures Wound Margin: Distinct, outline attached Exudate Medium Amount: Exudate Type: Purulent Exudate Color: yellow, brown, green Foul Odor After Cleansing: No Slough/Fibrino Yes Wound Bed Granulation Amount: Medium (34-66%) Exposed Structure Granulation Quality: Pale, Hyper-granulation Fascia Exposed: No Necrotic Amount: Medium (34-66%) Fat Layer (Subcutaneous Tissue) Exposed: Yes Necrotic Quality: Adherent Slough Tendon Exposed: No JANELI, LEWISON ([id number]) Muscle Exposed: No Joint Exposed: No Bone Exposed: No Periwound Skin Texture Texture Color No Abnormalities Noted: No No Abnormalities Noted: No Callus: No Atrophie Blanche: No Crepitus: No Cyanosis: No Excoriation: No Ecchymosis: No Induration: No Erythema: Yes Rash: No Erythema Location: Circumferential Scarring: No Hemosiderin Staining: No Mottled: No Moisture Pallor: No No Abnormalities Noted: No Rubor: No Dry / Scaly: No Maceration: Yes  Temperature / Pain Temperature: No Abnormality Tenderness on Palpation: Yes Treatment Notes Wound #9 (Left Elbow) Notes medihoney, BFD Electronic Signature(s) Signed: 03/20/2019 2:47:29 PM By: Curtis Sites Entered By: Curtis Sites on 03/20/2019 11:22:19 [name] ([id number]) -------------------------------------------------------------------------------- Vitals Details Patient Name: [name] Date of Service: 03/20/2019 10:45 AM Medical Record Number: [id number] Patient Account Number: [id number] Date of Birth/Sex: 1930-06-19 (85 y.o. F) Treating RN: Curtis Sites Primary Care Veronique Warga: Aram Beecham Other Clinician: Referring Johnryan Sao: Aram Beecham Treating Jamelyn Bovard/Extender: Altamese [place] in Treatment: 11 Vital Signs Time Taken: 11:14 Temperature (F): 97.9 Height (in): 60 Pulse (bpm): 67 Weight (lbs): 123 Respiratory Rate (breaths/min): 16 Body Mass Index (BMI): 24 Blood Pressure (mmHg): 127/52 Reference Range: 80 - 120 mg / dl Electronic Signature(s) Signed: 03/20/2019 2:47:29 PM By: Curtis Sites Entered By: Curtis Sites on 03/20/2019 11:14:41

## 2019-04-01 NOTE — Therapy (Signed)
[facility] [address], [place], [address] Phone: [phone]   Fax:  [phone]  Patient Details  Name: [name] MRN: [id number] Date of Birth: 18-Nov-1930 Referring Provider:  Cristopher Peru, MD  Encounter Date: 04/01/2019   Cristopher Peru, MD,   [name] Chamber was referred for outpatient physical  therapy services.  Due to COVID-19, it will be more appropriate at this time to refer for home health therapy services.  The patient is at risk for falls with standing activities. Pt needs standing balance activities to progress and someone with her for safety and due to her fear of falling. Pt agreeable to continuing with home health physical therapy while the out patient facility is closed.    Thank you,  Loralyn Freshwater PT, DPT   04/01/2019, 4:31 PM  [facility] PH[facility] AND SPORTS MEDICINE [address], [place], [address] Phone: [phone]   Fax:  [phone]

## 2019-04-02 ENCOUNTER — Telehealth: Payer: Self-pay

## 2019-04-02 NOTE — Telephone Encounter (Signed)
Called Dr. Clydia Llano Shah's office and left a message with his front office staff Gunnar Fusi) recommending home health physical therapy for Mrs. [name] secondary to the outpatient clinic closure as a result of COVID-19. Pt will need someone to be physically with her for safety for balance exercises.

## 2019-04-03 ENCOUNTER — Encounter: Payer: Medicare Other | Admitting: Internal Medicine

## 2019-04-03 ENCOUNTER — Other Ambulatory Visit: Payer: Self-pay

## 2019-04-03 DIAGNOSIS — L98495 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: Secondary | ICD-10-CM | POA: Diagnosis not present

## 2019-04-03 NOTE — Progress Notes (Signed)
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 Signature(s) Signed: 04/03/2019 10:34:24 AM By: Baltazar Najjar MD Entered By: Baltazar Najjar on 04/03/2019 10:34:23 [name] ([id number]) -------------------------------------------------------------------------------- SuperBill Details Patient Name: [name] Date of Service: 04/03/2019 Medical Record Number: [id number] Patient Account Number: [id number] Date of Birth/Sex: [birth year] (85 y.o. 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

## 2019-04-08 ENCOUNTER — Telehealth: Payer: Self-pay

## 2019-04-08 NOTE — Telephone Encounter (Signed)
Called Dr. Clydia Llano Shah's office. Talked to Lurena Joiner, his nurse pertaining to [name]'s continuation of balance treatment with home health PT due to outpatient facility closure from COVID-19 restrictions. Pt will benefit from home health PT better than telehealth secondary to PT will be physically with her for safety when performing balance exercises and activities. Lurena Joiner said that she will get a referral for home health PT set up.

## 2019-04-08 NOTE — Progress Notes (Signed)
[name], [name]. ([id number]) Visit Report for 04/03/2019 Arrival Information Details Patient Name: [name], [name]. Date of Service: 04/03/2019 9:45 AM Medical Record Number: [id number] Patient Account Number: [id number] Date of Birth/Sex: 03/23/1930 (85 y.o. F) Treating RN: [name] Primary Care [name]: [name] Other Clinician: Referring [name] Treating [name] Weeks in Treatment: 4272 Visit Information History Since Last Visit Added or deleted any medications: No Patient Arrived: Walker Any new allergies or adverse reactions: No Arrival Time: 09:54 Had a fall or experienced change in No Accompanied By: self activities of daily living that may affect Transfer Assistance: None risk of falls: Patient Identification Verified: Yes Signs or symptoms of abuse/neglect since last visito No Secondary Verification Process Yes Hospitalized since last visit: No Completed: Implantable device outside of the clinic excluding No Patient Requires Transmission-Based No cellular tissue based products placed in the center Precautions: since last visit: Patient Has Alerts: Yes Has Dressing in Place as Prescribed: Yes Patient Alerts: non compressible left Pain Present Now: Yes leg Electronic Signature(s) Signed: 04/03/2019 2:28:39 PM By: [name] Entered By: [name] on 04/03/2019 09:54:24 [name], [name]. ([id number]) -------------------------------------------------------------------------------- Clinic Level of Care Assessment Details Patient Name: [name], [name]. Date of Service: 04/03/2019 9:45 AM Medical Record Number: [id number] Patient Account Number: [id number] Date of Birth/Sex: 04/28/1930 (85 y.o. F) Treating RN: [name] Primary Care Gaurav Baldree: [name] Other Clinician: Referring Jamayah Myszka: [name] Treating Ivie Maese/Extender: [name] Weeks in Treatment: 3872 Clinic Level of Care  Assessment Items TOOL 4 Quantity Score []  - Use when only an EandM is performed on FOLLOW-UP visit 0 ASSESSMENTS - Nursing Assessment / Reassessment []  - Reassessment of Co-morbidities (includes updates in patient status) 0 X- 1 5 Reassessment of Adherence to Treatment Plan ASSESSMENTS - Wound and Skin Assessment / Reassessment X - Simple Wound Assessment / Reassessment - one wound 1 5 []  - 0 Complex Wound Assessment / Reassessment - multiple wounds []  - 0 Dermatologic / Skin Assessment (not related to wound area) ASSESSMENTS - Focused Assessment []  - Circumferential Edema Measurements - multi extremities 0 []  - 0 Nutritional Assessment / Counseling / Intervention []  - 0 Lower Extremity Assessment (monofilament, tuning fork, pulses) []  - 0 Peripheral Arterial Disease Assessment (using hand held doppler) ASSESSMENTS - Ostomy and/or Continence Assessment and Care []  - Incontinence Assessment and Management 0 []  - 0 Ostomy Care Assessment and Management (repouching, etc.) PROCESS - Coordination of Care X - Simple Patient / Family Education for ongoing care 1 15 []  - 0 Complex (extensive) Patient / Family Education for ongoing care []  - 0 Staff obtains ChiropractorConsents, Records, Test Results / Process Orders []  - 0 Staff telephones HHA, Nursing Homes / Clarify orders / etc []  - 0 Routine Transfer to another Facility (non-emergent condition) []  - 0 Routine Hospital Admission (non-emergent condition) []  - 0 New Admissions / Manufacturing engineernsurance Authorizations / Ordering NPWT, Apligraf, etc. []  - 0 Emergency Hospital Admission (emergent condition) X- 1 10 Simple Discharge Coordination [name], [name]. ([id number]) []  - 0 Complex (extensive) Discharge Coordination PROCESS - Special Needs []  - Pediatric / Minor Patient Management 0 []  - 0 Isolation Patient Management []  - 0 Hearing / Language / Visual special needs []  - 0 Assessment of Community assistance (transportation, D/C planning,  etc.) []  - 0 Additional assistance / Altered mentation []  - 0 Support Surface(s) Assessment (bed, cushion, seat, etc.) INTERVENTIONS - Wound Cleansing / Measurement X - Simple Wound Cleansing - one wound 1 5 []  -  0 Complex Wound Cleansing - multiple wounds X- 1 5 Wound Imaging (photographs - any number of wounds) []  - 0 Wound Tracing (instead of photographs) X- 1 5 Simple Wound Measurement - one wound []  - 0 Complex Wound Measurement - multiple wounds INTERVENTIONS - Wound Dressings []  - Small Wound Dressing one or multiple wounds 0 X- 1 15 Medium Wound Dressing one or multiple wounds []  - 0 Large Wound Dressing one or multiple wounds []  - 0 Application of Medications - topical []  - 0 Application of Medications - injection INTERVENTIONS - Miscellaneous []  - External ear exam 0 []  - 0 Specimen Collection (cultures, biopsies, blood, body fluids, etc.) []  - 0 Specimen(s) / Culture(s) sent or taken to Lab for analysis []  - 0 Patient Transfer (multiple staff / Nurse, adultHoyer Lift / Similar devices) []  - 0 Simple Staple / Suture removal (25 or less) []  - 0 Complex Staple / Suture removal (26 or more) []  - 0 Hypo / Hyperglycemic Management (close monitor of Blood Glucose) []  - 0 Ankle / Brachial Index (ABI) - do not check if billed separately X- 1 5 Vital Signs [name], [name]. ([id number]) Has the patient been seen at the hospital within the last three years: Yes Total Score: 70 Level Of Care: New/Established - Level 2 Electronic Signature(s) Signed: 04/03/2019 4:31:15 PM By: Elliot GurneyWoody, BSN, RN, CWS, Kim RN, BSN Entered By: Elliot GurneyWoody, BSN, RN, CWS, Barnett on 04/03/2019 10:16:11 [name], [name]. ([id number]) -------------------------------------------------------------------------------- Encounter Discharge Information Details Patient Name: [name], [name]. Date of Service: 04/03/2019 9:45 AM Medical Record Number: [id number] Patient Account Number: [id number] Date of Birth/Sex: 09/16/1930  (85 y.o. F) Treating RN: Arnette NorrisBiell, Kristina Primary Care Thompson Mckim: [name] Other Clinician: Referring Shiheem Corporan: [name] Treating Dominique Ressel/Extender: [name] Weeks in Treatment: 7872 Encounter Discharge Information Items Discharge Condition: Stable Ambulatory Status: Walker Discharge Destination: Home Transportation: Private Auto Accompanied By: self Schedule Follow-up Appointment: Yes Clinical Summary of Care: Electronic Signature(s) Signed: 04/08/2019 4:10:42 PM By: Arnette NorrisBiell, Kristina Entered By: Arnette NorrisBiell, Kristina on 04/03/2019 10:29:28 [name], [name]. ([id number]) -------------------------------------------------------------------------------- Lower Extremity Assessment Details Patient Name: [name], Savahna Barnett. Date of Service: 04/03/2019 9:45 AM Medical Record Number: [id number] Patient Account Number: [id number] Date of Birth/Sex: 02/12/1930 (85 y.o. F) Treating RN: [name] Primary Care Jaidence Geisler: [name] Other Clinician: Referring Tavis Kring: [name] Treating Akaylah Lalley/Extender: [name] Weeks in Treatment: 3872 Electronic Signature(s) Signed: 04/03/2019 2:28:39 PM By: [name] Entered By: [name] on 04/03/2019 09:58:15 [name], [name]. ([id number]) -------------------------------------------------------------------------------- Multi Wound Chart Details Patient Name: [name], [name]. Date of Service: 04/03/2019 9:45 AM Medical Record Number: [id number] Patient Account Number: [id number] Date of Birth/Sex: 05/14/1930 (85 y.o. 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/A Neuritis, Chronic sinus problems/congestion, Middle ear problems Date Acquired: 01/26/2018 N/A N/A Weeks of Treatment: 48 N/A N/A Wound Status: Open N/A N/A Measurements L x W x D 1.1x0.4x0.3 N/A N/A (cm) Area (cm) : 0.346 N/A N/A Volume (cm) : 0.104 N/A N/A % Reduction in Area: -387.30% N/A N/A % Reduction in Volume: -642.90% N/A N/A Starting Position 1 5 (o'clock): Ending Position 1  11 (o'clock): Maximum Distance 1 (cm): 0.4 Undermining: Yes N/A N/A Classification: Full Thickness With Exposed N/A N/A Support Structures Exudate Amount: Medium N/A N/A Exudate Type: Purulent N/A N/A Exudate Color: yellow, brown, green N/A N/A Wound Margin: Distinct, outline attached N/A N/A Granulation Amount: Large (67-100%) N/A N/A RHEMA, LOYAL ([id number]) Granulation Quality: Pale, Hyper-granulation N/A N/A Necrotic Amount: Small (1-33%) N/A N/A Exposed Structures: Fat Layer (Subcutaneous N/A N/A Tissue) Exposed: Yes Fascia: No Tendon: No Muscle: No Joint: No Bone: No Epithelialization: None N/A N/A Periwound Skin Texture: Excoriation: No N/A N/A Induration: No Callus: No Crepitus: No Rash: No Scarring: No Periwound Skin Moisture: Maceration: Yes N/A N/A Dry/Scaly: No Periwound Skin Color: Erythema: Yes N/A N/A Atrophie Blanche: No Cyanosis: No Ecchymosis: No Hemosiderin Staining: No Mottled: No Pallor: No Rubor: No Erythema Location: Circumferential N/A N/A Temperature: No Abnormality N/A N/A Tenderness on Palpation: Yes N/A N/A Treatment Notes Electronic Signature(s) Signed: 04/03/2019 3:44:07 PM By: Baltazar Najjar MD Entered By: Baltazar Najjar on 04/03/2019 10:27:13 [name] ([id number]) -------------------------------------------------------------------------------- Multi-Disciplinary Care Plan  Details Patient Name: [name] Date of Service: 04/03/2019 9:45 AM Medical Record Number: [id number] Patient Account Number: [id number] Date of Birth/Sex: 11/09/1930 (85 y.o. F) Treating RN: Natasha Coventry Primary Care Siara Gorder: Natasha Beecham Other Clinician: Referring Iam Lipson: Natasha Beecham Treating Jun Osment/Extender: Natasha [place] in Treatment: 16 Active Inactive Soft Tissue Infection Nursing Diagnoses: Impaired tissue integrity Goals: Patient's soft tissue infection will resolve Date Initiated: 07/18/2018 Target Resolution Date: 07/27/2018 Goal Status: Active Interventions: Assess signs and symptoms of infection every visit Treatment Activities: Culture and sensitivity : 07/18/2018 Systemic antibiotics : 07/18/2018 Notes: Wound/Skin Impairment Nursing Diagnoses: Impaired tissue integrity Knowledge deficit related to ulceration/compromised skin integrity Goals: Patient/caregiver will verbalize understanding of skin care regimen Date Initiated: 11/14/2017 Target Resolution Date: 11/28/2017 Goal Status: Active Ulcer/skin breakdown will have a volume reduction of 30% by week 4 Date Initiated: 11/14/2017 Target Resolution Date: 11/28/2017 Goal Status: Active Interventions: Assess patient/caregiver ability to obtain necessary supplies Assess patient/caregiver ability to perform ulcer/skin care regimen upon admission and as needed Assess ulceration(s) every visit Treatment Activities: Skin care regimen initiated : 11/14/2017 NIKKEA, BOSHELL ([id number]) Notes: Electronic Signature(s) Signed: 04/03/2019 4:31:15 PM By: Elliot Gurney, BSN, RN, CWS, Kim RN, BSN Entered By: Elliot Gurney, BSN, RN, CWS, Barnett on 04/03/2019 10:09:07 [name] ([id number]) -------------------------------------------------------------------------------- Pain Assessment Details Patient Name: [name] Date of Service: 04/03/2019 9:45 AM Medical Record Number: [id number] Patient Account Number:  [id number] Date of Birth/Sex: [birth year] (85 y.o. F) Treating RN: Natasha Sites Primary Care Zasha Belleau: Natasha Beecham Other Clinician: Referring Yanilen Adamik: Natasha Beecham Treating Nolton Denis/Extender: Natasha [place] in Treatment: 68 Active Problems Location of Pain Severity and Description of Pain Patient Has Paino Yes Site Locations Pain Location: Pain in Ulcers With Dressing Change: Yes Duration of the Pain. Constant / Intermittento Intermittent Pain Management and Medication Current Pain Management: Electronic Signature(s) Signed: 04/03/2019 2:28:39 PM By: Natasha Sites Entered By: Natasha Sites on 04/03/2019 09:54:40 [name] ([id number]) -------------------------------------------------------------------------------- Patient/Caregiver Education Details Patient Name: [name] Date of Service: 04/03/2019 9:45 AM Medical Record Number: [id number] Patient Account Number: [id number] Date of Birth/Gender: [birth year] (85 y.o. F) Treating RN: Natasha Coventry Primary Care Physician: Natasha Beecham Other Clinician: Referring Physician: Aram Beecham Treating Physician/Extender: Natasha [place] in Treatment: 58 Education Assessment Education Provided To: Patient Education Topics Provided Peripheral Neuropathy: Pressure: Wound/Skin Impairment: Handouts: Caring for Your Ulcer, Other: Wear brace and wound care as prescribed Methods:  Demonstration, Explain/Verbal Responses: State content correctly Electronic Signature(s) Signed: 04/03/2019 4:31:15 PM By: Elliot Gurney, BSN, RN, CWS, Kim RN, BSN Entered By: Elliot Gurney, BSN, RN, CWS, Barnett on 04/03/2019 10:14:13 [name] ([id number]) -------------------------------------------------------------------------------- Wound Assessment Details Patient Name: [name] Date of Service: 04/03/2019 9:45 AM Medical Record Number: [id number] Patient Account Number: [id number] Date of Birth/Sex: 03-Mar-1930 (85 y.o.  F) Treating RN: Natasha Sites Primary Care Kesley Mullens: Natasha Beecham Other Clinician: Referring Wai Minotti: Natasha Beecham Treating Birt Reinoso/Extender: Natasha [place] in Treatment: 72 Wound Status Wound Number: 9 Primary Open Surgical Wound Etiology: Wound Location: Left Elbow Wound Open Wounding Event: Surgical Injury Status: Date Acquired: 01/26/2018 Comorbid Cataracts, Glaucoma, Optic Neuritis, Chronic Weeks Of Treatment: 48 History: sinus problems/congestion, Middle ear problems Clustered Wound: No Photos Wound Measurements Length: (cm) 1.1 Width: (cm) 0.4 Depth: (cm) 0.3 Area: (cm) 0.346 Volume: (cm) 0.104 % Reduction in Area: -387.3% % Reduction in Volume: -642.9% Epithelialization: None Tunneling: No Undermining: Yes Starting Position (o'clock): 5 Ending Position (o'clock): 11 Maximum Distance: (cm) 0.4 Wound Description Full Thickness With Exposed Support Classification: Structures Wound Margin: Distinct, outline attached Exudate Medium Amount: Exudate Type: Purulent Exudate Color: yellow, brown, green Foul Odor After Cleansing: No Slough/Fibrino Yes Wound Bed Granulation Amount: Large (67-100%) Exposed Structure Granulation Quality: Pale, Hyper-granulation Fascia Exposed: No Necrotic Amount: Small (1-33%) Fat Layer (Subcutaneous Tissue) Exposed: Yes Necrotic Quality: Adherent Slough Tendon Exposed: No KLHOE, BIANCO ([id number]) Muscle Exposed: No Joint Exposed: No Bone Exposed: No Periwound Skin Texture Texture Color No Abnormalities Noted: No No Abnormalities Noted: No Callus: No Atrophie Blanche: No Crepitus: No Cyanosis: No Excoriation: No Ecchymosis: No Induration: No Erythema: Yes Rash: No Erythema Location: Circumferential Scarring: No Hemosiderin Staining: No Mottled: No Moisture Pallor: No No Abnormalities Noted: No Rubor: No Dry / Scaly: No Maceration: Yes Temperature / Pain Temperature: No  Abnormality Tenderness on Palpation: Yes Treatment Notes Wound #9 (Left Elbow) Notes medihoney, BFD Electronic Signature(s) Signed: 04/03/2019 2:28:39 PM By: Natasha Sites Entered By: Natasha Sites on 04/03/2019 10:03:13 [name] ([id number]) -------------------------------------------------------------------------------- Vitals Details Patient Name: [name] Date of Service: 04/03/2019 9:45 AM Medical Record Number: [id number] Patient Account Number: [id number] Date of Birth/Sex: 08-22-1930 (85 y.o. F) Treating RN: Natasha Sites Primary Care Keylee Shrestha: Natasha Beecham Other Clinician: Referring Sanaai Doane: Natasha Beecham Treating Aniesha Haughn/Extender: Natasha [place] in Treatment: 53 Vital Signs Time Taken: 09:57 Temperature (F): 97.7 Height (in): 60 Pulse (bpm): 73 Weight (lbs): 123 Respiratory Rate (breaths/min): 18 Body Mass Index (BMI): 24 Blood Pressure (mmHg): 160/57 Reference Range: 80 - 120 mg / dl Electronic Signature(s) Signed: 04/03/2019 2:28:39 PM By: Natasha Sites Entered By: Natasha Sites on 04/03/2019 09:58:07

## 2019-04-15 ENCOUNTER — Ambulatory Visit: Payer: Medicare Other

## 2019-04-17 ENCOUNTER — Ambulatory Visit: Payer: Medicare Other | Attending: Neurology

## 2019-04-17 ENCOUNTER — Other Ambulatory Visit: Payer: Self-pay

## 2019-04-17 ENCOUNTER — Encounter: Payer: Medicare Other | Attending: Internal Medicine | Admitting: Internal Medicine

## 2019-04-17 DIAGNOSIS — M6281 Muscle weakness (generalized): Secondary | ICD-10-CM | POA: Diagnosis present

## 2019-04-17 DIAGNOSIS — L98495 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: Secondary | ICD-10-CM | POA: Diagnosis not present

## 2019-04-17 DIAGNOSIS — M351 Other overlap syndromes: Secondary | ICD-10-CM | POA: Diagnosis not present

## 2019-04-17 DIAGNOSIS — Z881 Allergy status to other antibiotic agents status: Secondary | ICD-10-CM | POA: Insufficient documentation

## 2019-04-17 DIAGNOSIS — Z950 Presence of cardiac pacemaker: Secondary | ICD-10-CM | POA: Insufficient documentation

## 2019-04-17 DIAGNOSIS — Z880 Allergy status to penicillin: Secondary | ICD-10-CM | POA: Diagnosis not present

## 2019-04-17 DIAGNOSIS — R262 Difficulty in walking, not elsewhere classified: Secondary | ICD-10-CM | POA: Insufficient documentation

## 2019-04-17 DIAGNOSIS — M069 Rheumatoid arthritis, unspecified: Secondary | ICD-10-CM | POA: Insufficient documentation

## 2019-04-17 DIAGNOSIS — T8189XA Other complications of procedures, not elsewhere classified, initial encounter: Secondary | ICD-10-CM | POA: Diagnosis not present

## 2019-04-17 DIAGNOSIS — M545 Low back pain, unspecified: Secondary | ICD-10-CM

## 2019-04-17 DIAGNOSIS — Z9181 History of falling: Secondary | ICD-10-CM | POA: Insufficient documentation

## 2019-04-17 DIAGNOSIS — M419 Scoliosis, unspecified: Secondary | ICD-10-CM | POA: Diagnosis not present

## 2019-04-17 DIAGNOSIS — Y839 Surgical procedure, unspecified as the cause of abnormal reaction of the patient, or of later complication, without mention of misadventure at the time of the procedure: Secondary | ICD-10-CM | POA: Diagnosis not present

## 2019-04-17 DIAGNOSIS — R2681 Unsteadiness on feet: Secondary | ICD-10-CM

## 2019-04-17 DIAGNOSIS — G8929 Other chronic pain: Secondary | ICD-10-CM | POA: Insufficient documentation

## 2019-04-17 DIAGNOSIS — Z885 Allergy status to narcotic agent status: Secondary | ICD-10-CM | POA: Diagnosis not present

## 2019-04-17 NOTE — Therapy (Addendum)
[facility] [address] [place], [address] Phone: [phone]   Fax:  [phone]  Physical Therapy Treatment  Patient Details  Name: [name] MRN: [id number] Date of Birth: 04-06-1930 Referring Provider (PT): Cristopher Peru, MD   Encounter Date: 04/17/2019  PT End of Session - 04/17/19 1309    Visit Number  [id number]    Number of Visits  78    Date for PT Re-Evaluation  06/13/19    Authorization Type  8    Authorization Time Period  of 10 progress report    PT Start Time  1309    PT Stop Time  1351    PT Time Calculation (min)  42 min    Equipment Utilized During Treatment  Gait belt    Activity Tolerance  Patient tolerated treatment well    Behavior During Therapy  WFL for tasks assessed/performed       Past Medical History:  Diagnosis Date  . Anemia   . GERD (gastroesophageal reflux disease)   . Hypertension   . Peripheral vascular disease (HCC)    possible neuropathies in lower extremeties  . PONV (postoperative nausea and vomiting)    happens sometimes but better with pre med of zofran  . Presence of permanent cardiac pacemaker   . Syncope     Past Surgical History:  Procedure Laterality Date  . ABDOMINAL HYSTERECTOMY  1969  . APPENDECTOMY  1969   with hysterectomy  . BACK SURGERY  2001   rods in back. surgery on back x 3  . COLONOSCOPY    . I&D EXTREMITY Left 03/27/2018   Procedure: IRRIGATION AND DEBRIDEMENT LEFT ELBOW / OLECRANON BURSA;  Surgeon: Christena Flake, MD;  Location: [facility];  Service: Orthopedics;  Laterality: Left;  . I&D EXTREMITY Left 06/05/2018   Procedure: IRRIGATION AND DEBRIDEMENT EXTREMITY;  Surgeon: Christena Flake, MD;  Location: [facility];  Service: Orthopedics;  Laterality: Left;  . INSERT / REPLACE / REMOVE PACEMAKER  2004  . JOINT REPLACEMENT Left 2004   partial knee replacement  . OLECRANON BURSECTOMY Left 03/27/2018   Procedure: LEFT OLECRANON BURSA;  Surgeon:  Christena Flake, MD;  Location: [facility];  Service: Orthopedics;  Laterality: Left;  . PACEMAKER INSERTION  2014   dual chamber for complete heart block    There were no vitals filed for this visit.  Subjective Assessment - 04/17/19 1312    Subjective  Pt states that this session is her 4th appointment today. L elbow wound has not healed yet. Feels like she is out of shape. Does her exercises at home but feels like it is not enough.  Pt states feeling that she benefits from outpatient PT more than home health PT. Has not started home health PT.     Pertinent History  Imparied gait. Pt states that she has had balance issues for too long. Feels more comfortable walking with her rw. Had tests which showed that she has neuropathy. However the doctor at [facility] said that her balance is coming from her back. Just wants to be strong so she can walk.  Started walking with her rw off and on for the past several months.  Also feels like she might need a new sleep number bed. Also had 2 wound surgeries at [name]. Pt fell in February 2019. Had to stop PT due to the L elbow surgery April 06, 2018 to remove a bursa. The  wound still has not healed.   Currently sees a wound doctor.   Feels numbness L foot from the ankle down.  No R foot numbness.  Sometimes unsure of where her L LE is in space.  Pt also states that she tires too quickly.  Has not been able to walk her dog in 2 years. Has not really been doing exercises at home except the sit <> stand. Pt states that prolonged sitting without back support will increase back pain     Limitations  Standing;Walking    How long can you sit comfortably?  1 hr    How long can you stand comfortably?  10 minutes    How long can you walk comfortably?  2 or 3 blocks    Diagnostic tests  EMG testing BLEs and BUEs which were negative    Patient Stated Goals  Improve LE strength. Wants to use the machines at the gym. Walk more steadily. Be able to take her dog (16 lbs) out  on a leash.     Currently in Pain?  No/denies    Pain Score  0-No pain    Pain Onset  More than a month ago         [facility] PT Assessment - 04/17/19 1315      Strength   Right Hip Flexion  4/5    Right Hip Extension  4/5   seated manually resisted   Right Hip ABduction  4/5   clamshell isometric   Left Hip Flexion  4/5    Left Hip Extension  4-/5   seated manually resisted   Left Hip ABduction  4/5   clamshell isometric   Right Knee Flexion  4+/5    Right Knee Extension  5/5    Left Knee Flexion  4/5    Left Knee Extension  5/5      Dynamic Gait Index   Level Surface  Mild Impairment   uses SPC on R    Change in Gait Speed  Moderate Impairment    Gait with Horizontal Head Turns  Severe Impairment   Pt stops   Gait with Vertical Head Turns  Severe Impairment   pt stops   Gait and Pivot Turn  Moderate Impairment    Step Over Obstacle  Severe Impairment    Step Around Obstacles  Normal    Steps  Mild Impairment    Total Score  9    DGI comment:  with SPC. < 19/24 suggests increased risk for falls                           PT Education - 04/17/19 1727    Education provided  Yes    Education Details  ther-ex, plan of care    Person(s) Educated  Patient    Methods  Explanation;Demonstration;Tactile cues;Verbal cues    Comprehension  Verbalized understanding;Returned demonstration         Objectives  Pt ambulating with rw  TTP bilateral greater trochanter L >R   L elbow wound  No latex band allergies    MedbridgeAccess Code: [email or web address]   TTP L pes anserine location  Pt has pacemaker   Therapeutic exercise  Seated manually resisted hip flexion, hip extension, clamshell (hips less than 90 degrees flexion), knee flexion, knee extension 1-2x each way for each LE  Decreased L hip extension strength since last measured in February 2020. Maintained strength overall.    10  MWT with SPC on R   23.73 sec, 20.69 seconds  (0.45 m/s average)   Directed patient with gait with normal gait speed, with changes in speed, 180 degree pivot turn, with R and L cervical rotation position, with cervical flexion and extension position, stepping around obstacles, stepping over an obstacle, ascending and descending 4 regular steps with UE assist   Stepping over a shoe box with one UE assist from treadmill bar  L LE 5x  R LE 5x    Then with use of SPC on R only, and stepping over with L LE 10x    Cues to promote foot clearance and decrease circumduction    LOB about 3x with PT min to mod assist to recover. Better able to clear obstacle overall observed with increased balance confidence,.    Improved exercise technique, movement at target joints, use of target muscles after mod verbal, visual, tactile cues.        Response to treatment Pt tolerated session well without aggravation of symptoms. Improved balance with increased balance confidence observed.   Clinical impression  Outpatient clinic currently re-opening, allowing more patients for treatment with application of COVID-19 precautions such as patient and therapist use of mask and frequent cleaning. Pt returns to the clinic after about a 1.5 month hiatus due to COVID-19 related restrictions and demonstrates overall decrease in balance and gait speed (decreased DGI and 10 MWT scores) since last measured. Able to maintain bilateral LE strength overall but some weakness in L hip extension strength. Pt able to perform some of her HEP but unable to perform balance related activities at home due to fall risk. Per pt, she feels a benefit from participating in outpatient PT for her balance based on her subjective reports. Pt will benefit from continued skilled physical therapy services to improve bilateral LE strength, balance, improve balance confidence, function, endurance, and ability to ambulate with less difficulty.          PT Short Term Goals - 04/17/19 1359       PT SHORT TERM GOAL #1   Title  Patient will be independent with her HEP to improve strength and balance.     Time  3    Period  Weeks    Status  On-going    Target Date  05/08/19        PT Long Term Goals - 04/17/19 1323      PT LONG TERM GOAL #1   Title  Patient will improve her 10 MWT to 0.8 m/s or more with rw to promote better community ambulation.     Baseline  0.5 seconds with rw (09/06/2018); 0.63 seconds average with rw (10/18/2018); 0.69 m/s average using rw (12/10/2018); 0.59 m/s using SPC and CGA (01/08/2019); 0.60 m/s using SPC (01/21/2019); 0.56 m/s with SPC to no AD assist, CGA (02/04/2019); 0.45 m/s average (04/17/2019)    Time  8    Period  Weeks    Status  On-going    Target Date  06/13/19      PT LONG TERM GOAL #2   Title  Patient will improve her DGI score using rw or least restrictive AD to 19/24 or more to promote balance.     Baseline  13/24 using rw (09/06/2018); 14/24 using rw (10/29/2018); 16/24 (12/10/2018); 14/24 using SPC (01/21/2019); 9/24 using SPC (04/17/2019)     Time  8    Period  Weeks    Status  On-going    Target Date  06/13/19      PT LONG TERM GOAL #3   Title  Patient will improve bilateral LE strength by at least 1/2 MMT to promote ability to support herself when performing standing tasks and improve balance.     Time  8    Period  Weeks    Status  On-going    Target Date  06/13/19      PT LONG TERM GOAL #4   Title  Patient will have a decrease in back pain/ache to 5/10 or less to promote ability to perform standing tasks with less difficulty.    Baseline  8/10 at most for the past 3 months (09/06/2018); 5-6/10 at worst for the past 7 days (10/18/2018); 5/10 at worst for the past 7 days (12/10/2018)    Time  6    Period  Weeks    Status  Achieved            Plan - 04/17/19 1358    Clinical Impression Statement  Outpatient clinic currently re-opening, allowing more patients for treatment with application of COVID-19 precautions such as  patient and therapist use of mask and frequent cleaning. Pt returns to the clinic after about a 1.5 month hiatus due to COVID-19 related restrictions and demonstrates overall decrease in balance and gait speed (decreased DGI and 10 MWT scores) since last measured. Able to maintain bilateral LE strength overall but some weakness in L hip extension strength. Pt able to perform some of her HEP but unable to perform balance related activities at home due to fall risk. Per pt, she feels a benefit from participating in outpatient PT for her balance based on her subjective reports. Pt will benefit from continued skilled physical therapy services to improve bilateral LE strength, balance, improve balance confidence, function, endurance, and ability to ambulate with less difficulty.     Personal Factors and Comorbidities  Age;Comorbidity 3+;Past/Current Experience;Fitness;Time since onset of injury/illness/exacerbation    Comorbidities   pacemaker, difficulty with healing of L elbow wound, peripheral vascular disease, hx of back surgery    Examination-Activity Limitations  Carry;Lift;Locomotion Level    Stability/Clinical Decision Making  Evolving/Moderate complexity    Clinical Decision Making  Moderate    Clinical Presentation due to:  decreased balance, and gait speed    Rehab Potential  Fair    Clinical Impairments Affecting Rehab Potential  (-) chronicity of condition, age, comorbidities; (+) motivated    PT Frequency  2x / week    PT Duration  8 weeks    PT Treatment/Interventions  Aquatic Therapy;Gait training;Therapeutic activities;Therapeutic exercise;Balance training;Neuromuscular re-education;Patient/family education;Manual techniques;Dry needling    PT Next Visit Plan  trunk, hip, scapular strengthening, balance, gait, manual techniques PRN    Consulted and Agree with Plan of Care  Patient       Patient will benefit from skilled therapeutic intervention in order to improve the following deficits  and impairments:  Abnormal gait, Decreased activity tolerance, Decreased balance, Decreased endurance, Decreased range of motion, Decreased strength, Difficulty walking, Improper body mechanics, Postural dysfunction, Pain, Decreased knowledge of use of DME  Visit Diagnosis: Unsteadiness on feet - Plan: PT plan of care cert/re-cert  Difficulty in walking, not elsewhere classified - Plan: PT plan of care cert/re-cert  Muscle weakness (generalized) - Plan: PT plan of care cert/re-cert  History of falling - Plan: PT plan of care cert/re-cert  Chronic low back pain, unspecified back pain laterality, unspecified whether sciatica present - Plan: PT plan of care cert/re-cert  Problem List Patient Active Problem List   Diagnosis Date Noted  . Septic olecranon bursitis of left elbow 03/27/2018  . Lymphedema 10/11/2017  . Chronic venous insufficiency 10/11/2017  . Leg pain 10/11/2017  . Leg swelling 10/11/2017  . COPD (chronic obstructive pulmonary disease) (HCC) 10/11/2017  . Essential hypertension 10/11/2017    Loralyn Freshwater PT, DPT   04/18/2019, 8:54 AM  [facility]ORTS MEDICINE [address] [place], [address] Phone: [phone]   Fax:  [phone]  Name: [name] MRN: [id number] Date of Birth: 07-19-1930

## 2019-04-18 NOTE — Progress Notes (Signed)
BHUMIKA, PELISSIER ([id number]) Visit Report for 04/17/2019 Arrival Information Details Patient Name: [name], [name]. Date of Service: 04/17/2019 10:00 AM Medical Record Number: [id number] Patient Account Number: [id number] Date of Birth/Sex: 1930-04-12 (85 y.o. F) Treating RN: Huel Coventry Primary Care Geovana Gebel: Aram Beecham Other Clinician: Referring Bristyn Kulesza: Aram Beecham Treating Lakyra Tippins/Extender: Altamese [place] in Treatment: 47 Visit Information History Since Last Visit Added or deleted any medications: No Patient Arrived: Dan Humphreys Any new allergies or adverse reactions: No Arrival Time: 10:08 Had a fall or experienced change in No Accompanied By: self activities of daily living that may affect Transfer Assistance: None risk of falls: Patient Identification Verified: Yes Signs or symptoms of abuse/neglect since last visito No Secondary Verification Process Yes Hospitalized since last visit: No Completed: Implantable device outside of the clinic excluding No Patient Requires Transmission-Based No cellular tissue based products placed in the center Precautions: since last visit: Patient Has Alerts: Yes Has Dressing in Place as Prescribed: Yes Patient Alerts: non compressible left Pain Present Now: No leg Electronic Signature(s) Signed: 04/17/2019 5:13:19 PM By: Elliot Gurney, BSN, RN, CWS, Kim RN, BSN Entered By: Elliot Gurney, BSN, RN, CWS, Kim on 04/17/2019 10:08:48 [name] ([id number]) -------------------------------------------------------------------------------- Clinic Level of Care Assessment Details Patient Name: [name] Date of Service: 04/17/2019 10:00 AM Medical Record Number: [id number] Patient Account Number: [id number] Date of Birth/Sex: 05/06/1930 (85 y.o. F) Treating RN: Huel Coventry Primary Care Lennan Malone: Aram Beecham Other Clinician: Referring Syenna Nazir: Aram Beecham Treating Marilynn Ekstein/Extender: Altamese [place] in Treatment: 63 Clinic  Level of Care Assessment Items TOOL 4 Quantity Score []  - Use when only an EandM is performed on FOLLOW-UP visit 0 ASSESSMENTS - Nursing Assessment / Reassessment []  - Reassessment of Co-morbidities (includes updates in patient status) 0 X- 1 5 Reassessment of Adherence to Treatment Plan ASSESSMENTS - Wound and Skin Assessment / Reassessment X - Simple Wound Assessment / Reassessment - one wound 1 5 []  - 0 Complex Wound Assessment / Reassessment - multiple wounds []  - 0 Dermatologic / Skin Assessment (not related to wound area) ASSESSMENTS - Focused Assessment []  - Circumferential Edema Measurements - multi extremities 0 []  - 0 Nutritional Assessment / Counseling / Intervention []  - 0 Lower Extremity Assessment (monofilament, tuning fork, pulses) []  - 0 Peripheral Arterial Disease Assessment (using hand held doppler) ASSESSMENTS - Ostomy and/or Continence Assessment and Care []  - Incontinence Assessment and Management 0 []  - 0 Ostomy Care Assessment and Management (repouching, etc.) PROCESS - Coordination of Care X - Simple Patient / Family Education for ongoing care 1 15 []  - 0 Complex (extensive) Patient / Family Education for ongoing care []  - 0 Staff obtains Chiropractor, Records, Test Results / Process Orders []  - 0 Staff telephones HHA, Nursing Homes / Clarify orders / etc []  - 0 Routine Transfer to another Facility (non-emergent condition) []  - 0 Routine Hospital Admission (non-emergent condition) []  - 0 New Admissions / Manufacturing engineer / Ordering NPWT, Apligraf, etc. []  - 0 Emergency Hospital Admission (emergent condition) X- 1 10 Simple Discharge Coordination [name], [name] ([id number]) []  - 0 Complex (extensive) Discharge Coordination PROCESS - Special Needs []  - Pediatric / Minor Patient Management 0 []  - 0 Isolation Patient Management []  - 0 Hearing / Language / Visual special needs []  - 0 Assessment of Community assistance (transportation, D/[name]  planning, etc.) []  - 0 Additional assistance / Altered mentation []  - 0 Support Surface(s) Assessment (bed, cushion, seat, etc.) INTERVENTIONS - Wound Cleansing / Measurement X -  Simple Wound Cleansing - one wound 1 5 []  - 0 Complex Wound Cleansing - multiple wounds X- 1 5 Wound Imaging (photographs - any number of wounds) []  - 0 Wound Tracing (instead of photographs) X- 1 5 Simple Wound Measurement - one wound []  - 0 Complex Wound Measurement - multiple wounds INTERVENTIONS - Wound Dressings []  - Small Wound Dressing one or multiple wounds 0 X- 1 15 Medium Wound Dressing one or multiple wounds []  - 0 Large Wound Dressing one or multiple wounds []  - 0 Application of Medications - topical []  - 0 Application of Medications - injection INTERVENTIONS - Miscellaneous []  - External ear exam 0 []  - 0 Specimen Collection (cultures, biopsies, blood, body fluids, etc.) []  - 0 Specimen(s) / Culture(s) sent or taken to Lab for analysis []  - 0 Patient Transfer (multiple staff / Nurse, adult / Similar devices) []  - 0 Simple Staple / Suture removal (25 or less) []  - 0 Complex Staple / Suture removal (26 or more) []  - 0 Hypo / Hyperglycemic Management (close monitor of Blood Glucose) []  - 0 Ankle / Brachial Index (ABI) - do not check if billed separately X- 1 5 Vital Signs [name], [name] ([id number]) Has the patient been seen at the hospital within the last three years: Yes Total Score: 70 Level Of Care: New/Established - Level 2 Electronic Signature(s) Signed: 04/17/2019 5:13:19 PM By: Elliot Gurney, BSN, RN, CWS, Kim RN, BSN Entered By: Elliot Gurney, BSN, RN, CWS, Kim on 04/17/2019 10:23:06 [name] ([id number]) -------------------------------------------------------------------------------- Encounter Discharge Information Details Patient Name: [name] Date of Service: 04/17/2019 10:00 AM Medical Record Number: [id number] Patient Account Number: [id number] Date of Birth/Sex:  [birth year] (85 y.o. F) Treating RN: Curtis Sites Primary Care Erion Hermans: Aram Beecham Other Clinician: Referring Seabron Iannello: Aram Beecham Treating Brycin Kille/Extender: Altamese [place] in Treatment: 11 Encounter Discharge Information Items Discharge Condition: Stable Ambulatory Status: Walker Discharge Destination: Home Transportation: Private Auto Accompanied By: self Schedule Follow-up Appointment: Yes Clinical Summary of Care: Electronic Signature(s) Signed: 04/17/2019 11:57:55 AM By: Curtis Sites Entered By: Curtis Sites on 04/17/2019 11:57:54 [name] ([id number]) -------------------------------------------------------------------------------- Lower Extremity Assessment Details Patient Name: [name] Date of Service: 04/17/2019 10:00 AM Medical Record Number: [id number] Patient Account Number: [id number] Date of Birth/Sex: Apr 29, 1930 (85 y.o. F) Treating RN: Huel Coventry Primary Care Tajia Szeliga: Aram Beecham Other Clinician: Referring Lillyn Wieczorek: Aram Beecham Treating Topacio Cella/Extender: Altamese [place] in Treatment: 85 Electronic Signature(s) Signed: 04/17/2019 5:13:19 PM By: Elliot Gurney, BSN, RN, CWS, Kim RN, BSN Entered By: Elliot Gurney, BSN, RN, CWS, Kim on 04/17/2019 10:15:01 [name] ([id number]) -------------------------------------------------------------------------------- Multi Wound Chart Details Patient Name: [name] Date of Service: 04/17/2019 10:00 AM Medical Record Number: [id number] Patient Account Number: [id number] Date of Birth/Sex: 07/28/1930 (85 y.o. F) Treating RN: Huel Coventry Primary Care Hyman Crossan: Aram Beecham Other Clinician: Referring Sima Lindenberger: Aram Beecham Treating Mickal Meno/Extender: Altamese [place] in Treatment: 53 Vital Signs Height(in): 60 Pulse(bpm): 72 Weight(lbs): 123 Blood Pressure(mmHg): 170/62 Body Mass Index(BMI): 24 Temperature(F): 98.2 Respiratory Rate 16 (breaths/min): Photos:  [N/A:N/A] Wound Location: Left Elbow N/A N/A Wounding Event: Surgical Injury N/A N/A Primary Etiology: Open Surgical Wound N/A N/A Comorbid History: Cataracts, Glaucoma, Optic N/A N/A Neuritis, Chronic sinus problems/congestion, Middle ear problems Date Acquired: 01/26/2018 N/A N/A Weeks of Treatment: 50 N/A N/A Wound Status: Open N/A N/A Measurements L x W x D 0.9x0.6x0.3 N/A N/A (cm) Area (cm) : 0.424 N/A N/A Volume (cm) : 0.127 N/A N/A % Reduction in Area: -  497.20% N/A N/A % Reduction in Volume: -807.10% N/A N/A Starting Position 1 6 (o'clock): Ending Position 1 12 (o'clock): Maximum Distance 1 (cm): 0.5 Undermining: Yes N/A N/A Classification: Full Thickness With Exposed N/A N/A Support Structures Exudate Amount: Medium N/A N/A Exudate Type: Purulent N/A N/A Exudate Color: yellow, brown, green N/A N/A Wound Margin: Distinct, outline attached N/A N/A Granulation Amount: Large (67-100%) N/A N/A [name], [name] ([id number]) Granulation Quality: Pale, Hyper-granulation N/A N/A Necrotic Amount: Small (1-33%) N/A N/A Exposed Structures: Fat Layer (Subcutaneous N/A N/A Tissue) Exposed: Yes Fascia: No Tendon: No Muscle: No Joint: No Bone: No Epithelialization: None N/A N/A Periwound Skin Texture: Excoriation: No N/A N/A Induration: No Callus: No Crepitus: No Rash: No Scarring: No Periwound Skin Moisture: Maceration: Yes N/A N/A Dry/Scaly: No Periwound Skin Color: Erythema: Yes N/A N/A Atrophie Blanche: No Cyanosis: No Ecchymosis: No Hemosiderin Staining: No Mottled: No Pallor: No Rubor: No Erythema Location: Circumferential N/A N/A Temperature: No Abnormality N/A N/A Tenderness on Palpation: Yes N/A N/A Treatment Notes Electronic Signature(s) Signed: 04/17/2019 4:51:59 PM By: Baltazar Najjarobson, Michael MD Entered By: Baltazar Najjarobson, Michael on 04/17/2019 10:30:58 [name], [name]  ([id number]) -------------------------------------------------------------------------------- Multi-Disciplinary Care Plan Details Patient Name: [name], [name] Date of Service: 04/17/2019 10:00 AM Medical Record Number: [id number] Patient Account Number: [id number] Date of Birth/Sex: 07/06/1930 (85 y.o. F) Treating RN: Huel CoventryWoody, Kim Primary Care Kamillah Didonato: Aram BeechamSparks, Jeffrey Other Clinician: Referring Kiyon Fidalgo: Aram BeechamSparks, Jeffrey Treating Teyah Rossy/Extender: Altamese CarolinaOBSON, [name] Weeks in Treatment: 4874 Active Inactive Soft Tissue Infection Nursing Diagnoses: Impaired tissue integrity Goals: Patient's soft tissue infection will resolve Date Initiated: 07/18/2018 Target Resolution Date: 07/27/2018 Goal Status: Active Interventions: Assess signs and symptoms of infection every visit Treatment Activities: Culture and sensitivity : 07/18/2018 Systemic antibiotics : 07/18/2018 Notes: Wound/Skin Impairment Nursing Diagnoses: Impaired tissue integrity Knowledge deficit related to ulceration/compromised skin integrity Goals: Patient/caregiver will verbalize understanding of skin care regimen Date Initiated: 11/14/2017 Target Resolution Date: 11/28/2017 Goal Status: Active Ulcer/skin breakdown will have a volume reduction of 30% by week 4 Date Initiated: 11/14/2017 Target Resolution Date: 11/28/2017 Goal Status: Active Interventions: Assess patient/caregiver ability to obtain necessary supplies Assess patient/caregiver ability to perform ulcer/skin care regimen upon admission and as needed Assess ulceration(s) every visit Treatment Activities: Skin care regimen initiated : 11/14/2017 [name], [name] ([id number]) Notes: Electronic Signature(s) Signed: 04/17/2019 5:13:19 PM By: Elliot GurneyWoody, BSN, RN, CWS, Kim RN, BSN Entered By: Elliot GurneyWoody, BSN, RN, CWS, Kim on 04/17/2019 10:18:22 [name], Vernel V. ([id number]) -------------------------------------------------------------------------------- Pain Assessment  Details Patient Name: [name], [name] Date of Service: 04/17/2019 10:00 AM Medical Record Number: [id number] Patient Account Number: [id number] Date of Birth/Sex: 09/26/1930 (85 y.o. F) Treating RN: Huel CoventryWoody, Kim Primary Care Supriya Beaston: Aram BeechamSparks, Jeffrey Other Clinician: Referring Anthonymichael Munday: Aram BeechamSparks, Jeffrey Treating Cheryln Balcom/Extender: Altamese CarolinaOBSON, [name] Weeks in Treatment: 2374 Active Problems Location of Pain Severity and Description of Pain Patient Has Paino No Site Locations Pain Management and Medication Current Pain Management: Electronic Signature(s) Signed: 04/17/2019 5:13:19 PM By: Elliot GurneyWoody, BSN, RN, CWS, Kim RN, BSN Entered By: Elliot GurneyWoody, BSN, RN, CWS, Kim on 04/17/2019 10:08:56 [name], Cl[place] V. ([id number]) -------------------------------------------------------------------------------- Patient/Caregiver Education Details Patient Name: [name], [name] Date of Service: 04/17/2019 10:00 AM Medical Record Number: [id number] Patient Account Number: [id number] Date of Birth/Gender: 08/01/1930 (85 y.o. F) Treating RN: Huel CoventryWoody, Kim Primary Care Physician: Aram BeechamSparks, Jeffrey Other Clinician: Referring Physician: Aram BeechamSparks, Jeffrey Treating Physician/Extender: Altamese CarolinaOBSON, [name] Weeks in Treatment: 2574 Education Assessment Education Provided To: Patient Education Topics Provided Wound/Skin Impairment: Handouts: Caring for Your Ulcer Methods: Demonstration, Explain/Verbal  Responses: State content correctly Electronic Signature(s) Signed: 04/17/2019 5:13:19 PM By: Elliot Gurney, BSN, RN, CWS, Kim RN, BSN Entered By: Elliot Gurney, BSN, RN, CWS, Kim on 04/17/2019 10:24:40 [name] ([id number]) -------------------------------------------------------------------------------- Wound Assessment Details Patient Name: [name] Date of Service: 04/17/2019 10:00 AM Medical Record Number: [id number] Patient Account Number: [id number] Date of Birth/Sex: July 01, 1930 (85 y.o. F) Treating RN: Huel Coventry Primary Care Cyndi Montejano:  Aram Beecham Other Clinician: Referring Azilee Pirro: Aram Beecham Treating Puneet Selden/Extender: Altamese [place] in Treatment: 25 Wound Status Wound Number: 9 Primary Open Surgical Wound Etiology: Wound Location: Left Elbow Wound Open Wounding Event: Surgical Injury Status: Date Acquired: 01/26/2018 Comorbid Cataracts, Glaucoma, Optic Neuritis, Chronic Weeks Of Treatment: 50 History: sinus problems/congestion, Middle ear problems Clustered Wound: No Photos Wound Measurements Length: (cm) 0.9 Width: (cm) 0.6 Depth: (cm) 0.3 Area: (cm) 0.424 Volume: (cm) 0.127 % Reduction in Area: -497.2% % Reduction in Volume: -807.1% Epithelialization: None Undermining: Yes Starting Position (o'clock): 6 Ending Position (o'clock): 12 Maximum Distance: (cm) 0.5 Wound Description Full Thickness With Exposed Support Classification: Structures Wound Margin: Distinct, outline attached Exudate Medium Amount: Exudate Type: Purulent Exudate Color: yellow, brown, green Foul Odor After Cleansing: No Slough/Fibrino Yes Wound Bed Granulation Amount: Large (67-100%) Exposed Structure Granulation Quality: Pale, Hyper-granulation Fascia Exposed: No Necrotic Amount: Small (1-33%) Fat Layer (Subcutaneous Tissue) Exposed: Yes Necrotic Quality: Adherent Slough Tendon Exposed: No Muscle Exposed: No GINNETTE, GATES ([id number]) Joint Exposed: No Bone Exposed: No Periwound Skin Texture Texture Color No Abnormalities Noted: No No Abnormalities Noted: No Callus: No Atrophie Blanche: No Crepitus: No Cyanosis: No Excoriation: No Ecchymosis: No Induration: No Erythema: Yes Rash: No Erythema Location: Circumferential Scarring: No Hemosiderin Staining: No Mottled: No Moisture Pallor: No No Abnormalities Noted: No Rubor: No Dry / Scaly: No Maceration: Yes Temperature / Pain Temperature: No Abnormality Tenderness on Palpation: Yes Treatment Notes Wound #9 (Left  Elbow) Notes endoform ag, BFD Electronic Signature(s) Signed: 04/17/2019 5:13:19 PM By: Elliot Gurney, BSN, RN, CWS, Kim RN, BSN Entered By: Elliot Gurney, BSN, RN, CWS, Kim on 04/17/2019 10:20:25 [name] ([id number]) -------------------------------------------------------------------------------- Vitals Details Patient Name: [name] Date of Service: 04/17/2019 10:00 AM Medical Record Number: [id number] Patient Account Number: [id number] Date of Birth/Sex: December 06, 1930 (85 y.o. F) Treating RN: Huel Coventry Primary Care Orlondo Holy[place]: Aram Beecham Other Clinician: Referring Alleyah Twombly: Aram Beecham Treating Sanja Elizardo/Extender: Altamese [place] in Treatment: 60 Vital Signs Time Taken: 10:08 Temperature (F): 98.2 Height (in): 60 Pulse (bpm): 72 Weight (lbs): 123 Respiratory Rate (breaths/min): 16 Body Mass Index (BMI): 24 Blood Pressure (mmHg): 170/62 Reference Range: 80 - 120 mg / dl Electronic Signature(s) Signed: 04/17/2019 5:13:19 PM By: Elliot Gurney, BSN, RN, CWS, Kim RN, BSN Entered By: Elliot Gurney, BSN, RN, CWS, Kim on 04/17/2019 10:11:06

## 2019-04-18 NOTE — Addendum Note (Signed)
Addended by: Charlene Brooke on: 04/18/2019 08:54 AM   Modules accepted: Orders

## 2019-04-18 NOTE — Progress Notes (Signed)
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 3. She did have some response to Grafix PL we tried some months ago 4. I see no evidence of infection 5. We gave the meta honey about a month worth of a trial. May have been some adherence but this is clearly not going to close this wound. 6. I have asked her to use her Tubigrip in order to inhibit joint mobility Electronic Signature(s) Signed: 04/17/2019 10:49:42 AM By: Baltazar Najjarobson,  Michael MD Entered By: Baltazar Najjarobson, Michael on 04/17/2019 10:49:41 [name], Jeffery V. ([id number]) -------------------------------------------------------------------------------- SuperBill Details Patient Name: [name], Lakoda V. Date of Service: 04/17/2019 Medical Record Number: [id number] Patient Account Number: [id number] Date of Birth/Sex: 05/06/1930 (85 y.o. 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

## 2019-04-23 ENCOUNTER — Ambulatory Visit: Payer: Medicare Other | Admitting: Physical Therapy

## 2019-04-23 ENCOUNTER — Other Ambulatory Visit: Payer: Self-pay

## 2019-04-23 DIAGNOSIS — R262 Difficulty in walking, not elsewhere classified: Secondary | ICD-10-CM

## 2019-04-23 DIAGNOSIS — M545 Low back pain, unspecified: Secondary | ICD-10-CM

## 2019-04-23 DIAGNOSIS — G8929 Other chronic pain: Secondary | ICD-10-CM

## 2019-04-23 DIAGNOSIS — R2681 Unsteadiness on feet: Secondary | ICD-10-CM

## 2019-04-23 DIAGNOSIS — Z9181 History of falling: Secondary | ICD-10-CM

## 2019-04-23 DIAGNOSIS — M6281 Muscle weakness (generalized): Secondary | ICD-10-CM

## 2019-04-23 NOTE — Therapy (Signed)
[facility] PHYSICAL AND SPOR[facility] MEDICINE [address] [place], [place], [address] Phone: [phone]   Fax:  [phone]  Physical Therapy Treatment  Patient Details  Name: Natasha BassetJanice V Barnett MRN: [id number] Date of Birth: 01/05/1930 Referring Provider (PT): Cristopher PeruHemang Shah, MD   Encounter Date: 04/23/2019  PT End of Session - 04/23/19 1718    Visit Number  [id number]    Number of Visits  78    Date for PT Re-Evaluation  06/13/19    Authorization Type  9    Authorization Time Period  of 10 progress report    PT Start Time  1400    PT Stop Time  1450    PT Time Calculation (min)  50 min    Equipment Utilized During Treatment  Gait belt   RW, SPC   Activity Tolerance  Patient tolerated treatment well;Patient limited by fatigue    Behavior During Therapy  [facility] for tasks assessed/performed       Past Medical History:  Diagnosis Date  . Anemia   . GERD (gastroesophageal reflux disease)   . Hypertension   . Peripheral vascular disease (HCC)    possible neuropathies in lower extremeties  . PONV (postoperative nausea and vomiting)    happens sometimes but better with pre med of zofran  . Presence of permanent cardiac pacemaker   . Syncope     Past Surgical History:  Procedure Laterality Date  . ABDOMINAL HYSTERECTOMY  1969  . APPENDECTOMY  1969   with hysterectomy  . BACK SURGERY  2001   rods in back. surgery on back x 3  . COLONOSCOPY    . I&D EXTREMITY Left 03/27/2018   Procedure: IRRIGATION AND DEBRIDEMENT LEFT ELBOW / OLECRANON BURSA;  Surgeon: Christena FlakePoggi, John J, MD;  Location: [facility] OR[facility];  Service: Orthopedics;  Laterality: Left;  . I&D EXTREMITY Left 06/05/2018   Procedure: IRRIGATION AND DEBRIDEMENT EXTREMITY;  Surgeon: Christena FlakePoggi, John J, MD;  Location: [facility] OR[facility];  Service: Orthopedics;  Laterality: Left;  . INSERT / REPLACE / REMOVE PACEMAKER  2004  . JOINT REPLACEMENT Left 2004   partial knee replacement  . OLECRANON BURSECTOMY Left 03/27/2018   Procedure: LEFT OLECRANON BURSA;  Surgeon: Christena FlakePoggi, John J, MD;  Location: [facility] OR[facility];  Service: Orthopedics;  Laterality: Left;  . PACEMAKER INSERTION  2014   dual chamber for complete heart block    There were no vitals filed for this visit.  Subjective Assessment - 04/23/19 1409    Subjective  Patient reports she is feeling okay today. She states she was talking to a nurse suggested weight bearing exericse and walking would be good for her because she has osteoporosis. She is waiting to get injections for this for when COVID19 has cleared up more. She had two appointments yesterday but no changes in medications. She continues to do her HEP but looks forward to coming to PT. She is not wearing her left elbow splint today. L elbow wound has not healed yet.     Pertinent History  Imparied gait. Pt states that she has had balance issues for too long. Feels more comfortable walking with her rw. Had tests which showed that she has neuropathy. [facility] her balance is coming from her back. Just wants to be strong so she can walk.  Started walking with her rw off and on for the past several months.  Also feels like she might need a new sleep number bed.  Also had 2 wound surgeries at [name]. Pt fell in February 2019. Had to stop PT due to the L elbow surgery April 06, 2018 to remove a bursa. The wound still has not healed.   Currently sees a wound doctor.   Feels numbness L foot from the ankle down.  No R foot numbness.  Sometimes unsure of where her L LE is in space.  Pt also states that she tires too quickly.  Has not been able to walk her dog in 2 years. Has not really been doing exercises at home except the sit <> stand. Pt states that prolonged sitting without back support will increase back pain     Limitations  Standing;Walking    How long can you sit comfortably?  1 hr    How long can you stand comfortably?  10 minutes    How long can you walk comfortably?  2 or 3  blocks    Diagnostic tests  EMG testing BLEs and BUEs which were negative    Patient Stated Goals  Improve LE strength. Wants to use the machines at the gym. Walk more steadily. Be able to take her dog (16 lbs) out on a leash.     Currently in Pain?  No/denies   mild discomfort in hands   Pain Onset  More than a month ago        OBJECTIVE  Pt ambulating with rw  TTP bilateral greater trochanter L >R   L elbow wound  No latex band allergies    MedbridgeAccess Code: [email or web address]   TTP L pes anserine location  Pt has pacemaker   Therapeutic exercise: to centralize symptoms and improve ROM, strength, muscular endurance, and activity tolerance required for successful completion of functional activities. To promote LE strengthening (glute and quad) as well as for weight bearing exercise to help promote bone density  Nustep seat and R arm position 6; at level 2-3 for 7.5 minutes. Cues for pacing.              Performed to promote joint nutrition, as well as cardiovascular endurance.  During subjective exam.    Standing hip abduction with B UE assist 3 lbs ankle weight R 10x3 L 10x3  Leg press seat 2,  B LE plate 45 for 54Y5 Then plate 45 and a 5 lbs dumbbell x15  Step ups to 4" step, 2x10 each side with BUE support concentrating on improving functional strength, activity tolerance, and control.   Amb. 100 feet with unilateral hand held support for pt to use as neccesary. Cuing and focus on heel strike.   Amb with R hand held assist R and L turns around cones placed in star formation x 10 turns, then 130 feet around gym to improve endurance, balance. Cuing to extend stride length when tired.   Step over shoe box with unilateral UE support on bar x 10 each side with close supervision for safety. To prepare for stepping over obsticals in middle of room. Patient requested.   Hurdles (6x6 inches) Step-to  pattern 2x6 hurdles leading with each foot. Attempted with cane but pt greatly preferred unilateral hand held assist. Difficulty initiating movement due to apprehension about catching toe and tripping.   Patient response to treatment:  Pt tolerated treatment well. Pt was able to complete all exercises with minimal to no lasting increase in pain or discomfort. Patient fatigued quickly and required frequent rests between standing activities. She was willing to be challenged to  push herself a bit farther prior to rest when she began to feel fatigue and this was used several times throughout treatment to help improve endurance to work towards patient's stated goal of improving endurance. Patient was fearful to complete stepping exercise over hurdles and box and her fear of falling hindered her ability to initiate stepping, but she gave good effort at overcoming her fear and completing it anyway. Patient seemed pleased with the outcome of the session upon leaving. Pt required multimodal cuing for proper technique and to facilitate improved neuromuscular control, strength, range of motion, and functional ability resulting in improved performance and form. Patient would benefit from continued physical therapy to address remaining impairments and functional limitations to work towards stated goals and return to PLOF or maximal functional independence.     PT Education - 04/23/19 1718    Education provided  Yes    Education Details  Exercise purpose/form. Self management techniques. Education on diagnosis, prognosis, POC, anatomy and physiology of current condition    Person(s) Educated  Patient    Methods  Explanation;Demonstration;Tactile cues;Verbal cues    Comprehension  Verbalized understanding;Returned demonstration;Verbal cues required;Tactile cues required;Need further instruction       PT Short Term Goals - 04/17/19 1359      PT SHORT TERM GOAL #1   Title  Patient will be independent with her HEP  to improve strength and balance.     Time  3    Period  Weeks    Status  On-going    Target Date  05/08/19        PT Long Term Goals - 04/17/19 1323      PT LONG TERM GOAL #1   Title  Patient will improve her 10 MWT to 0.8 m/s or more with rw to promote better community ambulation.     Baseline  0.5 seconds with rw (09/06/2018); 0.63 seconds average with rw (10/18/2018); 0.69 m/s average using rw (12/10/2018); 0.59 m/s using SPC and CGA (01/08/2019); 0.60 m/s using SPC (01/21/2019); 0.56 m/s with SPC to no AD assist, CGA (02/04/2019); 0.45 m/s average (04/17/2019)    Time  8    Period  Weeks    Status  On-going    Target Date  06/13/19      PT LONG TERM GOAL #2   Title  Patient will improve her DGI score using rw or least restrictive AD to 19/24 or more to promote balance.     Baseline  13/24 using rw (09/06/2018); 14/24 using rw (10/29/2018); 16/24 (12/10/2018); 14/24 using SPC (01/21/2019); 9/24 using SPC (04/17/2019)     Time  8    Period  Weeks    Status  On-going    Target Date  06/13/19      PT LONG TERM GOAL #3   Title  Patient will improve bilateral LE strength by at least 1/2 MMT to promote ability to support herself when performing standing tasks and improve balance.     Time  8    Period  Weeks    Status  On-going    Target Date  06/13/19      PT LONG TERM GOAL #4   Title  Patient will have a decrease in back pain/ache to 5/10 or less to promote ability to perform standing tasks with less difficulty.    Baseline  8/10 at most for the past 3 months (09/06/2018); 5-6/10 at worst for the past 7 days (10/18/2018); 5/10 at worst for the past 7  days (12/10/2018)    Time  6    Period  Weeks    Status  Achieved            Plan - 04/23/19 1728    Clinical Impression Statement  Pt tolerated treatment well. Pt was able to complete all exercises with minimal to no lasting increase in pain or discomfort. Patient fatigued quickly and required frequent rests between standing  activities. She was willing to be challenged to push herself a bit farther prior to rest when she began to feel fatigue and this was used several times throughout treatment to help improve endurance to work towards patient's stated goal of improving endurance. Patient was fearful to complete stepping exercise over hurdles and box and her fear of falling hindered her ability to initiate stepping, but she gave good effort at overcoming her fear and completing it anyway. Patient seemed pleased with the outcome of the session upon leaving. Pt required multimodal cuing for proper technique and to facilitate improved neuromuscular control, strength, range of motion, and functional ability resulting in improved performance and form. Patient would benefit from continued physical therapy to address remaining impairments and functional limitations to work towards stated goals and return to PLOF or maximal functional independence.    Personal Factors and Comorbidities  Age;Comorbidity 3+;Past/Current Experience;Fitness;Time since onset of injury/illness/exacerbation    Comorbidities   pacemaker, difficulty with healing of L elbow wound, peripheral vascular disease, hx of back surgery    Examination-Activity Limitations  Carry;Lift;Locomotion Level    Stability/Clinical Decision Making  Evolving/Moderate complexity    Rehab Potential  Fair    Clinical Impairments Affecting Rehab Potential  (-) chronicity of condition, age, comorbidities; (+) motivated    PT Frequency  2x / week    PT Duration  8 weeks    PT Treatment/Interventions  Aquatic Therapy;Gait training;Therapeutic activities;Therapeutic exercise;Balance training;Neuromuscular re-education;Patient/family education;Manual techniques;Dry needling    PT Next Visit Plan  trunk, hip, scapular strengthening, balance, gait, manual techniques PRN    PT Home Exercise Plan  Sit<>stand, standing hip ext and abd, bridging, and heel raises    Consulted and Agree with  Plan of Care  Patient       Patient will benefit from skilled therapeutic intervention in order to improve the following deficits and impairments:  Abnormal gait, Decreased activity tolerance, Decreased balance, Decreased endurance, Decreased range of motion, Decreased strength, Difficulty walking, Improper body mechanics, Postural dysfunction, Pain, Decreased knowledge of use of DME  Visit Diagnosis: Unsteadiness on feet  Difficulty in walking, not elsewhere classified  Muscle weakness (generalized)  History of falling  Chronic low back pain, unspecified back pain laterality, unspecified whether sciatica present     Problem List Patient Active Problem List   Diagnosis Date Noted  . Septic olecranon bursitis of left elbow 03/27/2018  . Lymphedema 10/11/2017  . Chronic venous insufficiency 10/11/2017  . Leg pain 10/11/2017  . Leg swelling 10/11/2017  . COPD (chronic obstructive pulmonary disease) (HCC) 10/11/2017  . Essential hypertension 10/11/2017    Luretha Murphy. Ilsa Iha, PT, DPT 04/23/19, 5:31 PM  [facility] PHYSICAL AND SPORTS MEDICINE [address] [place], [address] Phone: [phone]   Fax:  [phone]  Name: KEELEE HANKERSON MRN: [id number] Date of Birth: 01-16-1930

## 2019-04-24 ENCOUNTER — Encounter: Payer: Medicare Other | Admitting: Internal Medicine

## 2019-04-24 DIAGNOSIS — T8189XA Other complications of procedures, not elsewhere classified, initial encounter: Secondary | ICD-10-CM | POA: Diagnosis not present

## 2019-04-24 NOTE — Progress Notes (Signed)
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 Appropriate interactions and affect.. General Notes: Wound exam; left elbow she has the crescent-shaped undermining from roughly 6-12 o'clock maximum of about a centimeter. The wound surface does not look too bad. There is nothing to debride here. She does have a surrounding degree of erythema with a pinkish-red hue. Not convinced that this represents cellulitis rather inflammation however I will give her a course of antibiotics. I did not do a peripheral culture Integumentary (Hair,  Skin) There is a pinkish hue around the wound area. I think this is chronic inflammation rather than cellulitis however it is very difficult to tell. Some tenderness.. Wound #9 status is Open. Original cause of wound was Surgical Injury. The wound is located on the Left Elbow. The wound measures 0.9cm length x 0.6cm width x 0.3cm depth; 0.424cm^2 area and 0.127cm^3 volume. There is Fat Layer (Subcutaneous Tissue) Exposed exposed. There is no tunneling noted, however, there is undermining starting at 6:00 and ending at 12:00 with a maximum distance of 0.9cm. There is a medium amount of purulent drainage noted. The wound margin is distinct with the outline attached to the wound base. There is large (67-100%) pale, hyper - granulation within the wound bed. There is a small (1-33%) amount of necrotic tissue within the wound bed including Adherent Slough. The periwound skin appearance exhibited: Maceration, Erythema. The periwound skin appearance did not exhibit: Callus, Crepitus, Excoriation, Induration, Rash, Scarring, Dry/Scaly, Atrophie Blanche, Cyanosis, Ecchymosis, Hemosiderin Staining, Mottled, Pallor, Rubor. The surrounding wound skin color is noted with erythema which is circumferential. Periwound temperature was noted as No Abnormality. The periwound has tenderness on palpation. 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 ([id number]) Plan Wound Cleansing: Wound #9 Left Elbow: May Shower, gently pat wound dry prior to applying new dressing. Anesthetic (add to Medication List): Wound #9 Left Elbow: Topical Lidocaine 4% cream applied to wound bed prior to debridement (In Clinic Only). Primary Wound Dressing: Wound #9 Left Elbow: Other: - Endoform antimicrobial; pack into undermining. Secondary Dressing: Wound #9 Left  Elbow: Boardered Foam Dressing Dressing Change Frequency: Wound #9 Left Elbow: Change Dressing Monday, Wednesday, Friday Follow-up Appointments: Wound #9 Left Elbow: Return Appointment in 1 week. Edema Control: Wound #9 Left Elbow: Other: - [place] Medications-please add to medication list.: Wound #9 Left Elbow: P.O. Antibiotics The following medication(s) was prescribed: azithromycin oral 500 mg tablet 1 tablet oral daily for  5 days for cellulitis starting 04/23/2019 General Notes: Epifix Authorization 1. Nonhealing wound area. This is not changed much since last week. 2. I gave her 5 days worth of azithromycin 500 daily to help with the erythema either cellulitis or inflammation. However she has a long list of allergies and this interacts with Plaquenil so only 5 days 3. I was going to consider epifix however the patient requested a referral to a plastic surgeon in [place] with [facility] day Dr. Wyline Mood and we will try to arrange that. We will continue to use the endoform in the meantime. I am doubtful the patient would be eligible for a skin graft to this area 4. Consider re-x-raying the tip of the olecranon process question osteomyelitis Electronic Signature(s) Signed: 04/24/2019 4:30:12 PM By: Baltazar Najjar MD Entered By: Baltazar Najjar on 04/24/2019 12:27:54 [name] ([id number]) -------------------------------------------------------------------------------- SuperBill Details Patient Name: [name] Date of Service: 04/24/2019 Medical Record Number: [id number] Patient Account Number: [id number] Date of Birth/Sex: 1930/04/04 (85 y.o. F) Treating RN: Huel Coventry Primary Care Provider: Aram Beecham Other Clinician: Referring Provider: Aram Beecham Treating Provider/Extender: Altamese [place] in Treatment: 9 Diagnosis Coding ICD-10 Codes Code Description S51.002S Unspecified open wound of left elbow, sequela L98.495 Non-pressure chronic  ulcer of skin of other sites with muscle involvement without evidence of necrosis Facility Procedures CPT4 Code: [id number] Description: [phone] - WOUND CARE VISIT-LEV 2 EST PT Modifier: Quantity: 1 Physician Procedures CPT4: Description Modifier Quantity Code [id number] - WC PHYS LEVEL 3 - EST PT 1 ICD-10 Diagnosis Description S51.002S Unspecified open wound of left elbow, sequela L98.495 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

## 2019-04-25 ENCOUNTER — Other Ambulatory Visit: Payer: Self-pay

## 2019-04-25 ENCOUNTER — Ambulatory Visit: Payer: Medicare Other

## 2019-04-25 DIAGNOSIS — R262 Difficulty in walking, not elsewhere classified: Secondary | ICD-10-CM

## 2019-04-25 DIAGNOSIS — R2681 Unsteadiness on feet: Secondary | ICD-10-CM | POA: Diagnosis not present

## 2019-04-25 DIAGNOSIS — Z9181 History of falling: Secondary | ICD-10-CM

## 2019-04-25 DIAGNOSIS — M6281 Muscle weakness (generalized): Secondary | ICD-10-CM

## 2019-04-25 NOTE — Progress Notes (Addendum)
STEPHINIE, JOLLIE ([id number]) Visit Report for 04/24/2019 Arrival Information Details Patient Name: HANEEFAH, HOPE Date of Service: 04/24/2019 10:00 AM Medical Record Number: [id number] Patient Account Number: [id number] Date of Birth/Sex: January 28, 1930 (85 y.o. F) Treating RN: Arnette Norris Primary Care Ayisha Pol: Aram Beecham Other Clinician: Referring Ajeet Casasola: Aram Beecham Treating Hina Gupta/Extender: Altamese [place] in Treatment: 37 Visit Information History Since Last Visit Added or deleted any medications: No Patient Arrived: Walker Any new allergies or adverse reactions: No Arrival Time: 10:12 Had a fall or experienced change in No Accompanied By: self activities of daily living that may affect Transfer Assistance: None risk of falls: Patient Identification Verified: Yes Signs or symptoms of abuse/neglect since last visito No Secondary Verification Process Yes Hospitalized since last visit: No Completed: Has Dressing in Place as Prescribed: Yes Patient Requires Transmission-Based No Pain Present Now: Yes Precautions: Patient Has Alerts: Yes Patient Alerts: non compressible left leg Electronic Signature(s) Signed: 04/24/2019 4:23:05 PM By: Arnette Norris Entered By: Arnette Norris on 04/24/2019 10:12:29 [name] ([id number]) -------------------------------------------------------------------------------- Clinic Level of Care Assessment Details Patient Name: [name] Date of Service: 04/24/2019 10:00 AM Medical Record Number: [id number] Patient Account Number: [id number] Date of Birth/Sex: 10/31/1930 (85 y.o. F) Treating RN: Huel Coventry Primary Care Fabiola Mudgett: Aram Beecham Other Clinician: Referring Marvalene Barrett: Aram Beecham Treating Averie Meiner/Extender: Altamese [place] in Treatment: 46 Clinic Level of Care Assessment Items TOOL 4 Quantity Score []  - Use when only an EandM is performed on FOLLOW-UP visit 0 ASSESSMENTS - Nursing  Assessment / Reassessment []  - Reassessment of Co-morbidities (includes updates in patient status) 0 []  - 0 Reassessment of Adherence to Treatment Plan ASSESSMENTS - Wound and Skin Assessment / Reassessment X - Simple Wound Assessment / Reassessment - one wound 1 5 []  - 0 Complex Wound Assessment / Reassessment - multiple wounds []  - 0 Dermatologic / Skin Assessment (not related to wound area) ASSESSMENTS - Focused Assessment []  - Circumferential Edema Measurements - multi extremities 0 []  - 0 Nutritional Assessment / Counseling / Intervention []  - 0 Lower Extremity Assessment (monofilament, tuning fork, pulses) []  - 0 Peripheral Arterial Disease Assessment (using hand held doppler) ASSESSMENTS - Ostomy and/or Continence Assessment and Care []  - Incontinence Assessment and Management 0 []  - 0 Ostomy Care Assessment and Management (repouching, etc.) PROCESS - Coordination of Care X - Simple Patient / Family Education for ongoing care 1 15 []  - 0 Complex (extensive) Patient / Family Education for ongoing care X- 1 10 Staff obtains Chiropractor, Records, Test Results / Process Orders []  - 0 Staff telephones HHA, Nursing Homes / Clarify orders / etc []  - 0 Routine Transfer to another Facility (non-emergent condition) []  - 0 Routine Hospital Admission (non-emergent condition) []  - 0 New Admissions / Manufacturing engineer / Ordering NPWT, Apligraf, etc. []  - 0 Emergency Hospital Admission (emergent condition) X- 1 10 Simple Discharge Coordination TERRANCE, DORF ([id number]) []  - 0 Complex (extensive) Discharge Coordination PROCESS - Special Needs []  - Pediatric / Minor Patient Management 0 []  - 0 Isolation Patient Management []  - 0 Hearing / Language / Visual special needs []  - 0 Assessment of Community assistance (transportation, D/C planning, etc.) []  - 0 Additional assistance / Altered mentation []  - 0 Support Surface(s) Assessment (bed, cushion, seat,  etc.) INTERVENTIONS - Wound Cleansing / Measurement X - Simple Wound Cleansing - one wound 1 5 []  - 0 Complex Wound Cleansing - multiple wounds X- 1 5 Wound Imaging (photographs - any number of  wounds)  - 0 Wound Tracing (instead of photographs) X- 1 5 Simple Wound Measurement - one wound  - 0 Complex Wound Measurement - multiple wounds INTERVENTIONS - Wound Dressings  - Small Wound Dressing one or multiple wounds 0 X- 1 15 Medium Wound Dressing one or multiple wounds  - 0 Large Wound Dressing one or multiple wounds  - 0 Application of Medications - topical  - 0 Application of Medications - injection INTERVENTIONS - Miscellaneous  - External ear exam 0  - 0 Specimen Collection (cultures, biopsies, blood, body fluids, etc.)  - 0 Specimen(s) / Culture(s) sent or taken to Lab for analysis  - 0 Patient Transfer (multiple staff / Nurse, adult / Similar devices)  - 0 Simple Staple / Suture removal (25 or less)  - 0 Complex Staple / Suture removal (26 or more)  - 0 Hypo / Hyperglycemic Management (close monitor of Blood Glucose)  - 0 Ankle / Brachial Index (ABI) - do not check if billed separately X- 1 5 Vital Signs LOYAL, RUDY ([id number]) Has the patient been seen at the hospital within the last three years: Yes Total Score: 75 Level Of Care: New/Established - Level 2 Electronic Signature(s) Signed: 04/25/2019 10:03:11 AM By: Elliot Gurney, BSN, RN, CWS, Kim RN, BSN Entered By: Elliot Gurney, BSN, RN, CWS, Kim on 04/24/2019 10:45:46 [name] ([id number]) -------------------------------------------------------------------------------- Encounter Discharge Information Details Patient Name: [name] Date of Service: 04/24/2019 10:00 AM Medical Record Number: [id number] Patient Account Number: [id number] Date of Birth/Sex: 06/08/1930 (85 y.o. F) Treating RN: Curtis Sites Primary Care Pricella Gaugh: Aram Beecham Other Clinician: Referring  Neleh Muldoon: Aram Beecham Treating Phill Steck/Extender: Altamese [place] in Treatment: 19 Encounter Discharge Information Items Discharge Condition: Stable Ambulatory Status: Walker Discharge Destination: Home Transportation: Private Auto Accompanied By: self Schedule Follow-up Appointment: Yes Clinical Summary of Care: Electronic Signature(s) Signed: 04/24/2019 3:06:33 PM By: Curtis Sites Entered By: Curtis Sites on 04/24/2019 10:53:17 [name] ([id number]) -------------------------------------------------------------------------------- Lower Extremity Assessment Details Patient Name: [name] Date of Service: 04/24/2019 10:00 AM Medical Record Number: [id number] Patient Account Number: [id number] Date of Birth/Sex: [birth year] (85 y.o. F) Treating RN: Arnette Norris Primary Care Amera Banos: Aram Beecham Other Clinician: Referring Jameil Whitmoyer: Aram Beecham Treating Deisi Salonga/Extender: Maxwell Caul Weeks in Treatment: 68 Electronic Signature(s) Signed: 04/24/2019 4:23:05 PM By: Arnette Norris Entered By: Arnette Norris on 04/24/2019 10:17:42 [name] ([id number]) -------------------------------------------------------------------------------- Multi Wound Chart Details Patient Name: [name] Date of Service: 04/24/2019 10:00 AM Medical Record Number: [id number] Patient Account Number: [id number] Date of Birth/Sex: 06/18/1930 (85 y.o. F) Treating RN: Huel Coventry Primary Care Natisha Trzcinski: Aram Beecham Other Clinician: Referring Zaydenn Balaguer: Aram Beecham Treating Alisen Marsiglia/Extender: Altamese [place] in Treatment: 34 Vital Signs Height(in): 60 Pulse(bpm): 68 Weight(lbs): 123 Blood Pressure(mmHg): 131/58 Body Mass Index(BMI): 24 Temperature(F): 97.9 Respiratory Rate 16 (breaths/min): Photos: [N/A:N/A] Wound Location: Left Elbow N/A N/A Wounding Event: Surgical Injury N/A N/A Primary Etiology: Open Surgical Wound N/A  N/A Comorbid History: Cataracts, Glaucoma, Optic N/A N/A Neuritis, Chronic sinus problems/congestion, Middle ear problems Date Acquired: 01/26/2018 N/A N/A Weeks of Treatment: 51 N/A N/A Wound Status: Open N/A N/A Measurements L x W x D 0.9x0.6x0.3 N/A N/A (cm) Area (cm) : 0.424 N/A N/A Volume (cm) : 0.127 N/A N/A % Reduction in Area: -497.20% N/A N/A % Reduction in Volume: -807.10% N/A N/A Starting Position 1 6 (o'clock): Ending Position 1 12 (o'clock): Maximum Distance 1 (cm): 0.9 Undermining: Yes N/A N/A Classification: Full Thickness With Exposed N/A  N/A Support Structures Exudate Amount: Medium N/A N/A Exudate Type: Purulent N/A N/A Exudate Color: yellow, brown, green N/A N/A Wound Margin: Distinct, outline attached N/A N/A Granulation Amount: Large (67-100%) N/A N/A RENAI, RHYNER ([id number]) Granulation Quality: Pale, Hyper-granulation N/A N/A Necrotic Amount: Small (1-33%) N/A N/A Exposed Structures: Fat Layer (Subcutaneous N/A N/A Tissue) Exposed: Yes Fascia: No Tendon: No Muscle: No Joint: No Bone: No Epithelialization: None N/A N/A Erythema Location: Circumferential N/A N/A Treatment Notes Wound #9 (Left Elbow) Notes endoform ag, BFD Electronic Signature(s) Signed: 04/24/2019 4:30:12 PM By: Baltazar Najjar MD Entered By: Baltazar Najjar on 04/24/2019 12:16:51 [name] ([id number]) -------------------------------------------------------------------------------- Multi-Disciplinary Care Plan Details Patient Name: [name] Date of Service: 04/24/2019 10:00 AM Medical Record Number: [id number] Patient Account Number: [id number] Date of Birth/Sex: May 25, 1930 (85 y.o. F) Treating RN: Huel Coventry Primary Care Kristof Nadeem: Aram Beecham Other Clinician: Referring Talisa Petrak: Aram Beecham Treating Kamisha Ell/Extender: Altamese [place] in Treatment: 70 Active Inactive Electronic Signature(s) Signed: 05/10/2019 8:14:38 AM By: Elliot Gurney, BSN,  RN, CWS, Kim RN, BSN Previous Signature: 04/25/2019 10:03:11 AM Version By: Elliot Gurney, BSN, RN, CWS, Kim RN, BSN Entered By: Elliot Gurney, BSN, RN, CWS, Kim on 05/10/2019 08:14:38 [name] ([id number]) -------------------------------------------------------------------------------- Pain Assessment Details Patient Name: SINDHUJA, BOLSTAD Date of Service: 04/24/2019 10:00 AM Medical Record Number: [id number] Patient Account Number: [id number] Date of Birth/Sex: 11/07/1930 (85 y.o. F) Treating RN: Arnette Norris Primary Care Nashia Remus: Aram Beecham Other Clinician: Referring Rhya Shan: Aram Beecham Treating Alek Borges/Extender: Altamese [place] in Treatment: 85 Active Problems Location of Pain Severity and Description of Pain Patient Has Paino Yes Site Locations Pain Location: Pain in Ulcers Rate the pain. Current Pain Level: 8 Pain Management and Medication Current Pain Management: Electronic Signature(s) Signed: 04/24/2019 4:23:05 PM By: Arnette Norris Entered By: Arnette Norris on 04/24/2019 10:12:57 [name] ([id number]) -------------------------------------------------------------------------------- Patient/Caregiver Education Details Patient Name: [name] Date of Service: 04/24/2019 10:00 AM Medical Record Number: [id number] Patient Account Number: [id number] Date of Birth/Gender: 11/26/1930 (85 y.o. F) Treating RN: Huel Coventry Primary Care Physician: Aram Beecham Other Clinician: Referring Physician: Aram Beecham Treating Physician/Extender: Altamese [place] in Treatment: 65 Education Assessment Education Provided To: Patient Education Topics Provided Infection: Handouts: Infection Prevention and Management Methods: Demonstration, Explain/Verbal Responses: State content correctly Wound/Skin Impairment: Handouts: Caring for Your Ulcer Methods: Demonstration, Explain/Verbal Responses: State content correctly Electronic  Signature(s) Signed: 04/25/2019 10:03:11 AM By: Elliot Gurney, BSN, RN, CWS, Kim RN, BSN Entered By: Elliot Gurney, BSN, RN, CWS, Kim on 04/24/2019 10:46:37 [name] ([id number]) -------------------------------------------------------------------------------- Wound Assessment Details Patient Name: [name] Date of Service: 04/24/2019 10:00 AM Medical Record Number: [id number] Patient Account Number: [id number] Date of Birth/Sex: [birth year] (85 y.o. F) Treating RN: Arnette Norris Primary Care Numair Masden: Aram Beecham Other Clinician: Referring Khalea Ventura: Aram Beecham Treating Madyx Delfin/Extender: Altamese [place] in Treatment: 75 Wound Status Wound Number: 9 Primary Open Surgical Wound Etiology: Wound Location: Left Elbow Wound Open Wounding Event: Surgical Injury Status: Date Acquired: 01/26/2018 Comorbid Cataracts, Glaucoma, Optic Neuritis, Chronic Weeks Of Treatment: 51 History: sinus problems/congestion, Middle ear problems Clustered Wound: No Photos Wound Measurements Length: (cm) 0.9 Width: (cm) 0.6 Depth: (cm) 0.3 Area: (cm) 0.424 Volume: (cm) 0.127 % Reduction in Area: -497.2% % Reduction in Volume: -807.1% Epithelialization: None Tunneling: No Undermining: Yes Starting Position (o'clock): 6 Ending Position (o'clock): 12 Maximum Distance: (cm) 0.9 Wound Description Full Thickness With Exposed Support Classification: Structures Wound Margin: Distinct, outline attached Exudate Medium Amount: Exudate Type: Purulent  Exudate Color: yellow, brown, green Foul Odor After Cleansing: No Slough/Fibrino Yes Wound Bed Granulation Amount: Large (67-100%) Exposed Structure Granulation Quality: Pale, Hyper-granulation Fascia Exposed: No Necrotic Amount: Small (1-33%) Fat Layer (Subcutaneous Tissue) Exposed: Yes Necrotic Quality: Adherent Slough Tendon Exposed: No [name], Zhoey V. ([id number]) Muscle Exposed: No Joint Exposed: No Bone Exposed:  No Periwound Skin Texture Texture Color No Abnormalities Noted: No No Abnormalities Noted: No Callus: No Atrophie Blanche: No Crepitus: No Cyanosis: No Excoriation: No Ecchymosis: No Induration: No Erythema: Yes Rash: No Erythema Location: Circumferential Scarring: No Hemosiderin Staining: No Mottled: No Moisture Pallor: No No Abnormalities Noted: No Rubor: No Dry / Scaly: No Maceration: Yes Temperature / Pain Temperature: No Abnormality Tenderness on Palpation: Yes Electronic Signature(s) Signed: 04/24/2019 4:23:05 PM By: Arnette NorrisBiell, Kristina Signed: 04/25/2019 10:03:11 AM By: Elliot GurneyWoody, BSN, RN, CWS, Kim RN, BSN Entered By: Elliot GurneyWoody, BSN, RN, CWS, Kim on 04/24/2019 10:41:10 [name], Nakenya V. ([id number]) -------------------------------------------------------------------------------- Vitals Details Patient Name: [name], Arthi V. Date of Service: 04/24/2019 10:00 AM Medical Record Number: [id number] Patient Account Number: [id number] Date of Birth/Sex: 10/19/1930 (85 y.o. F) Treating RN: Arnette NorrisBiell, Kristina Primary Care Crystalina Stodghill: Aram BeechamSparks, Jeffrey Other Clinician: Referring Katera Rybka: Aram BeechamSparks, Jeffrey Treating Nikkol Pai/Extender: Altamese CarolinaOBSON, [name] Weeks in Treatment: 6275 Vital Signs Time Taken: 10:10 Temperature (F): 97.9 Height (in): 60 Pulse (bpm): 68 Weight (lbs): 123 Respiratory Rate (breaths/min): 16 Body Mass Index (BMI): 24 Blood Pressure (mmHg): 131/58 Reference Range: 80 - 120 mg / dl Electronic Signature(s) Signed: 04/24/2019 4:23:05 PM By: Arnette NorrisBiell, Kristina Entered By: Arnette NorrisBiell, Kristina on 04/24/2019 10:16:58

## 2019-04-25 NOTE — Therapy (Signed)
[facility] [address], [place], [address] Phone: [phone]   Fax:  [phone]  Physical Therapy Treatment And Progress Report (02/06/2019- 04/25/2019)  Patient Details  Name: [name] MRN: [id number] Date of Birth: 09/24/1930 Referring Provider (PT): Cristopher Peru, MD   Encounter Date: 04/25/2019  PT End of Session - 04/25/19 1404    Visit Number  40    Number of Visits  78    Date for PT Re-Evaluation  06/13/19    Authorization Type  10    Authorization Time Period  of 10 progress report    PT Start Time  1405    PT Stop Time  1452    PT Time Calculation (min)  47 min    Equipment Utilized During Treatment  Gait belt   RW   Activity Tolerance  Patient tolerated treatment well;Patient limited by fatigue    Behavior During Therapy  [facility] for tasks assessed/performed       Past Medical History:  Diagnosis Date  . Anemia   . GERD (gastroesophageal reflux disease)   . Hypertension   . Peripheral vascular disease (HCC)    possible neuropathies in lower extremeties  . PONV (postoperative nausea and vomiting)    happens sometimes but better with pre med of zofran  . Presence of permanent cardiac pacemaker   . Syncope     Past Surgical History:  Procedure Laterality Date  . ABDOMINAL HYSTERECTOMY  1969  . APPENDECTOMY  1969   with hysterectomy  . BACK SURGERY  2001   rods in back. surgery on back x 3  . COLONOSCOPY    . I&D EXTREMITY Left 03/27/2018   Procedure: IRRIGATION AND DEBRIDEMENT LEFT ELBOW / OLECRANON BURSA;  Surgeon: Christena Flake, MD;  Location: [facility];  Service: Orthopedics;  Laterality: Left;  . I&D EXTREMITY Left 06/05/2018   Procedure: IRRIGATION AND DEBRIDEMENT EXTREMITY;  Surgeon: Christena Flake, MD;  Location: [facility] OR[facility];  Service: Orthopedics;  Laterality: Left;  . INSERT / REPLACE / REMOVE PACEMAKER  2004  . JOINT REPLACEMENT Left 2004   partial knee replacement  .  OLECRANON BURSECTOMY Left 03/27/2018   Procedure: LEFT OLECRANON BURSA;  Surgeon: Christena Flake, MD;  Location: [facility] ORS;  Service: Orthopedics;  Laterality: Left;  . PACEMAKER INSERTION  2014   dual chamber for complete heart block    There were no vitals filed for this visit.  Subjective Assessment - 04/25/19 1407    Subjective  Walking is not as steady as she would like but she is walking. Had a really good work out last session.     Pertinent History  Imparied gait. Pt states that she has had balance issues for too long. Feels more comfortable walking with her rw. Had tests which showed that she has neuropathy. However the doctor at [facility] said that her balance is coming from her back. Just wants to be strong so she can walk.  Started walking with her rw off and on for the past several months.  Also feels like she might need a new sleep number bed. Also had 2 wound surgeries at [name]. Pt fell in February 2019. Had to stop PT due to the L elbow surgery April 06, 2018 to remove a bursa. The wound still has not healed.   Currently sees a wound doctor.   Feels numbness L foot from the ankle down.  No R foot  numbness.  Sometimes unsure of where her L LE is in space.  Pt also states that she tires too quickly.  Has not been able to walk her dog in 2 years. Has not really been doing exercises at home except the sit <> stand. Pt states that prolonged sitting without back support will increase back pain     Limitations  Standing;Walking    How long can you sit comfortably?  1 hr    How long can you stand comfortably?  10 minutes    How long can you walk comfortably?  2 or 3 blocks    Diagnostic tests  EMG testing BLEs and BUEs which were negative    Patient Stated Goals  Improve LE strength. Wants to use the machines at the gym. Walk more steadily. Be able to take her dog (16 lbs) out on a leash.     Currently in Pain?  Yes   Hands hurt   Pain Score  6    5-6/10 low back pain   Pain Onset   More than a month ago                               PT Education - 04/25/19 1720    Education provided  Yes    Education Details  ther-ex    [employer]) Educated  Patient    Methods  Explanation;Demonstration;Tactile cues;Verbal cues    Comprehension  Verbalized understanding;Returned demonstration        OBJECTIVE  Pt ambulating with rw   L elbow wound  No latex band allergies    MedbridgeAccess Code: [email or web address]   Pt has pacemaker   Therapeutic exercise:   seated trunk extension isometrics 10x5 seconds for 3 sets  No back pain afterwards  Give as part of her HEP next visit if appropriate   Standing hip abduction with B UE assist 3 lbs ankle weight R 10x3 L 10x3  Gait around gym with R hand held assist (min A) 100 ft   Then another 100 ft R hand held assist min A to CGA  SLS with light touch assist   10x2 with 5 second holds each LE  Backwards walk to the chair about 3 ft throughout session, hand held min A   Step ups to 4" step, 2x10 each side with BUE support concentrating on improving functional strength, activity tolerance, and control.   Leg press seat 2,  B LE plate 45 for [id number] Then plate 45 and a 5 lbs dumbbell x15  Step over shoe box with unilateral UE support on bar x 10 each side with close supervision for safety.   Gait without UE assist 20 ft and 24 ft, CGA. Cues for pt not to be afraid. Able to perform without LOB.     Improved exercise technique, movement at target joints, use of target muscles after mod verbal, visual, tactile cues.      Patient response to treatment:  Improved ability to ambulate without UE assist and no LOB with improved balance confidence observed.    Clinical impression Continued working on trunk extensor muscle activation to help decrease back pain. Continued working on B LE strengthening to promote ability to  support herself when walking and performing standing tasks. Able to ambulate short distances without UE assist when pt feels confident that she is not going to fall (observed). No complain of back pain after session. Pt tolerated session well without  aggravation of symptoms. Pt will benefit from continued skilled physical therapy services to improve B LE strength, balance, function, and ability to ambulate.     PT Short Term Goals - 04/25/19 1723      PT SHORT TERM GOAL #1   Title  Patient will be independent with her HEP to improve strength and balance.     Time  3    Period  Weeks    Status  On-going    Target Date  05/08/19        PT Long Term Goals - 04/25/19 1455      PT LONG TERM GOAL #1   Title  Patient will improve her 10 MWT to 0.8 m/s or more with rw to promote better community ambulation.     Baseline  0.5 seconds with rw (09/06/2018); 0.63 seconds average with rw (10/18/2018); 0.69 m/s average using rw (12/10/2018); 0.59 m/s using SPC and CGA (01/08/2019); 0.60 m/s using SPC (01/21/2019); 0.56 m/s with SPC to no AD assist, CGA (02/04/2019); 0.45 m/s average (04/17/2019)    Time  8    Period  Weeks    Status  On-going    Target Date  06/13/19      PT LONG TERM GOAL #2   Title  Patient will improve her DGI score using rw or least restrictive AD to 19/24 or more to promote balance.     Baseline  13/24 using rw (09/06/2018); 14/24 using rw (10/29/2018); 16/24 (12/10/2018); 14/24 using SPC (01/21/2019); 9/24 using SPC (04/17/2019)     Time  8    Period  Weeks    Status  On-going    Target Date  06/13/19      PT LONG TERM GOAL #3   Title  Patient will improve bilateral LE strength by at least 1/2 MMT to promote ability to support herself when performing standing tasks and improve balance.     Time  8    Period  Weeks    Status  On-going    Target Date  06/13/19      PT LONG TERM GOAL #4   Title  Patient will have a decrease in back pain/ache to 5/10 or less to promote ability to  perform standing tasks with less difficulty.    Baseline  8/10 at most for the past 3 months (09/06/2018); 5-6/10 at worst for the past 7 days (10/18/2018); 5/10 at worst for the past 7 days (12/10/2018)    Time  6    Period  Weeks    Status  Achieved    Target Date  01/24/19            Plan - 04/25/19 1403    Clinical Impression Statement  Continued working on trunk extensor muscle activation to help decrease back pain. Continued working on B LE strengthening to promote ability to support herself when walking and performing standing tasks. Able to ambulate short distances without UE assist when pt feels confident that she is not going to fall (observed). No complain of back pain after session. Pt tolerated session well without aggravation of symptoms. Pt will benefit from continued skilled physical therapy services to improve B LE strength, balance, function, and ability to ambulate.     Personal Factors and Comorbidities  Age;Comorbidity 3+;Past/Current Experience;Fitness;Time since onset of injury/illness/exacerbation    Comorbidities   pacemaker, difficulty with healing of L elbow wound, peripheral vascular disease, hx of back surgery    Examination-Activity Limitations  Carry;Lift;Locomotion Level  Stability/Clinical Decision Making  Evolving/Moderate complexity    Clinical Decision Making  Moderate    Clinical Presentation due to:  overall decreased balance and gait speed compared to before clinic closed due to COVID-19    Rehab Potential  Fair    Clinical Impairments Affecting Rehab Potential  (-) chronicity of condition, age, comorbidities; (+) motivated    PT Frequency  2x / week    PT Duration  8 weeks    PT Treatment/Interventions  Aquatic Therapy;Gait training;Therapeutic activities;Therapeutic exercise;Balance training;Neuromuscular re-education;Patient/family education;Manual techniques;Dry needling    PT Next Visit Plan  trunk, hip, scapular strengthening, balance, gait,  manual techniques PRN    PT Home Exercise Plan  Sit<>stand, standing hip ext and abd, bridging, and heel raises    Consulted and Agree with Plan of Care  Patient       Patient will benefit from skilled therapeutic intervention in order to improve the following deficits and impairments:  Abnormal gait, Decreased activity tolerance, Decreased balance, Decreased endurance, Decreased range of motion, Decreased strength, Difficulty walking, Improper body mechanics, Postural dysfunction, Pain, Decreased knowledge of use of DME  Visit Diagnosis: Unsteadiness on feet  Difficulty in walking, not elsewhere classified  Muscle weakness (generalized)  History of falling     Problem List Patient Active Problem List   Diagnosis Date Noted  . Septic olecranon bursitis of left elbow 03/27/2018  . Lymphedema 10/11/2017  . Chronic venous insufficiency 10/11/2017  . Leg pain 10/11/2017  . Leg swelling 10/11/2017  . COPD (chronic obstructive pulmonary disease) (HCC) 10/11/2017  . Essential hypertension 10/11/2017    Loralyn FreshwaterMiguel Amiee Wiley PT, DPT   04/25/2019, 5:31 PM  [facility] [address] [place], [place], [address] Phone: [phone]   Fax:  [phone]  Name: Katherine BassetJanice V Inoue MRN: [id number] Date of Birth: 02/18/1930

## 2019-04-30 ENCOUNTER — Ambulatory Visit: Payer: Medicare Other

## 2019-04-30 ENCOUNTER — Other Ambulatory Visit: Payer: Self-pay

## 2019-04-30 DIAGNOSIS — Z9181 History of falling: Secondary | ICD-10-CM

## 2019-04-30 DIAGNOSIS — R262 Difficulty in walking, not elsewhere classified: Secondary | ICD-10-CM

## 2019-04-30 DIAGNOSIS — M6281 Muscle weakness (generalized): Secondary | ICD-10-CM

## 2019-04-30 DIAGNOSIS — R2681 Unsteadiness on feet: Secondary | ICD-10-CM | POA: Diagnosis not present

## 2019-04-30 NOTE — Therapy (Signed)
[facility] [address] [place], [address] Phone: [phone]   Fax:  [phone]  Physical Therapy Treatment  Patient Details  Name: [name] MRN: [id number] Date of Birth: 28-Mar-1930 Referring Provider (PT): Cristopher Peru, MD   Encounter Date: 04/30/2019  PT End of Session - 04/30/19 1406    Visit Number  41    Number of Visits  78    Date for PT Re-Evaluation  06/13/19    Authorization Type  1    Authorization Time Period  of 10 progress report    PT Start Time  1406    PT Stop Time  1448    PT Time Calculation (min)  42 min    Equipment Utilized During Treatment  Gait belt   RW   Activity Tolerance  Patient tolerated treatment well;Patient limited by fatigue    Behavior During Therapy  [facility] for tasks assessed/performed       Past Medical History:  Diagnosis Date  . Anemia   . GERD (gastroesophageal reflux disease)   . Hypertension   . Peripheral vascular disease (HCC)    possible neuropathies in lower extremeties  . PONV (postoperative nausea and vomiting)    happens sometimes but better with pre med of zofran  . Presence of permanent cardiac pacemaker   . Syncope     Past Surgical History:  Procedure Laterality Date  . ABDOMINAL HYSTERECTOMY  1969  . APPENDECTOMY  1969   with hysterectomy  . BACK SURGERY  2001   rods in back. surgery on back x 3  . COLONOSCOPY    . I&D EXTREMITY Left 03/27/2018   Procedure: IRRIGATION AND DEBRIDEMENT LEFT ELBOW / OLECRANON BURSA;  Surgeon: Christena Flake, MD;  Location: [facility];  Service: Orthopedics;  Laterality: Left;  . I&D EXTREMITY Left 06/05/2018   Procedure: IRRIGATION AND DEBRIDEMENT EXTREMITY;  Surgeon: Christena Flake, MD;  Location: [facility] OR[facility];  Service: Orthopedics;  Laterality: Left;  . INSERT / REPLACE / REMOVE PACEMAKER  2004  . JOINT REPLACEMENT Left 2004   partial knee replacement  . OLECRANON BURSECTOMY Left 03/27/2018   Procedure:  LEFT OLECRANON BURSA;  Surgeon: Christena Flake, MD;  Location: [facility] OR[facility];  Service: Orthopedics;  Laterality: Left;  . PACEMAKER INSERTION  2014   dual chamber for complete heart block    There were no vitals filed for this visit.  Subjective Assessment - 04/30/19 1409    Subjective  L knee bothered her after last session resulting in an ache at entire joint. Its the knee with a partial replacement. No pain in L knee but does not feel normal. Does not want to stop doing the exercises.  No back pain today. Back has not bothered her since last Thursday.     Pertinent History  Imparied gait. Pt states that she has had balance issues for too long. Feels more comfortable walking with her rw. Had tests which showed that she has neuropathy. However the doctor at [facility] said that her balance is coming from her back. Just wants to be strong so she can walk.  Started walking with her rw off and on for the past several months.  Also feels like she might need a new sleep number bed. Also had 2 wound surgeries at [name]. Pt fell in February 2019. Had to stop PT due to the L elbow surgery April 06, 2018 to remove a bursa. The  wound still has not healed.   Currently sees a wound doctor.   Feels numbness L foot from the ankle down.  No R foot numbness.  Sometimes unsure of where her L LE is in space.  Pt also states that she tires too quickly.  Has not been able to walk her dog in 2 years. Has not really been doing exercises at home except the sit <> stand. Pt states that prolonged sitting without back support will increase back pain     Limitations  Standing;Walking    How long can you sit comfortably?  1 hr    How long can you stand comfortably?  10 minutes    How long can you walk comfortably?  2 or 3 blocks    Diagnostic tests  EMG testing BLEs and BUEs which were negative    Patient Stated Goals  Improve LE strength. Wants to use the machines at the gym. Walk more steadily. Be able to take her dog (16  lbs) out on a leash.     Currently in Pain?  Other (Comment)    Pain Score  --   L elbow is bothering her   Pain Onset  More than a month ago                               PT Education - 04/30/19 1411    Education provided  Yes    Education Details  ther-ex    [employer]) Educated  Patient    Methods  Explanation;Demonstration;Tactile cues;Verbal cues    Comprehension  Returned demonstration;Verbalized understanding      OBJECTIVE  Pt ambulating with rw   L elbow wound  No latex band allergies    MedbridgeAccess Code: [email or web address]   Pt has pacemaker   Manual therapy   Seated STM L vastus lateralis, IT band, lateral hamstrings  Therapeutic exercise:  Recommended for pt to continue her seated trunk extension isometrics HEP  Gait around gym, handheld assist 200 ft. Pt LOB with mod A to recover at end of 200 ft secondary to pt shuffling feet.   Walking and stepping over shoe box with PT hand held assist. Min to mod A.   R 5x  L 5x  Cues to not circumduct.    Standing hip abduction with B UE assist 3 lbs ankle weight R 10x3 L 10x3  Gait without UE assist but CGA from PT 100 ft. Pt able to perform until she got startled towards the end of the 100 ft.    Leg press seat 2,  B LE plate 45 for [id number]  Improved exercise technique, movement at target joints, use of target muscles after mod verbal, visual, tactile cues.     Patient response to treatment:  Improved ability to ambulate without UE assist and no LOB with improved balance confidence observed.    Clinical impression Worked on decreasing tension to L vastus lateralis, IT band, and lateral hamstrings. No change in L knee "not feeling normal" sensation. No complain of L knee pain throughout session. Continued working on LE strengthening as well as gait with less assist. Pt able to ambulate close to 100 ft without  UE assist but with CGA from PT to help pt feel safe. LOB towards end of 100 ft secondary to pt being startled for reminding her to keep performing hip and knee flexion to maintain foot clearance. Improved ability to ambulate without UE assist  with increased balance confidence. Pt will benefit from continued skilled physical therapy services to improve LE strength, balance, function, and ability to ambulate.      PT Short Term Goals - 04/25/19 1723      PT SHORT TERM GOAL #1   Title  Patient will be independent with her HEP to improve strength and balance.     Time  3    Period  Weeks    Status  On-going    Target Date  05/08/19        PT Long Term Goals - 04/25/19 1455      PT LONG TERM GOAL #1   Title  Patient will improve her 10 MWT to 0.8 m/s or more with rw to promote better community ambulation.     Baseline  0.5 seconds with rw (09/06/2018); 0.63 seconds average with rw (10/18/2018); 0.69 m/s average using rw (12/10/2018); 0.59 m/s using SPC and CGA (01/08/2019); 0.60 m/s using SPC (01/21/2019); 0.56 m/s with SPC to no AD assist, CGA (02/04/2019); 0.45 m/s average (04/17/2019)    Time  8    Period  Weeks    Status  On-going    Target Date  06/13/19      PT LONG TERM GOAL #2   Title  Patient will improve her DGI score using rw or least restrictive AD to 19/24 or more to promote balance.     Baseline  13/24 using rw (09/06/2018); 14/24 using rw (10/29/2018); 16/24 (12/10/2018); 14/24 using SPC (01/21/2019); 9/24 using SPC (04/17/2019)     Time  8    Period  Weeks    Status  On-going    Target Date  06/13/19      PT LONG TERM GOAL #3   Title  Patient will improve bilateral LE strength by at least 1/2 MMT to promote ability to support herself when performing standing tasks and improve balance.     Time  8    Period  Weeks    Status  On-going    Target Date  06/13/19      PT LONG TERM GOAL #4   Title  Patient will have a decrease in back pain/ache to 5/10 or less to promote ability  to perform standing tasks with less difficulty.    Baseline  8/10 at most for the past 3 months (09/06/2018); 5-6/10 at worst for the past 7 days (10/18/2018); 5/10 at worst for the past 7 days (12/10/2018)    Time  6    Period  Weeks    Status  Achieved    Target Date  01/24/19            Plan - 04/30/19 1412    Clinical Impression Statement  Worked on decreasing tension to L vastus lateralis, IT band, and lateral hamstrings. No change in L knee not feeling normal sensation. No complain of L knee pain throughout session. Continued working on LE strengthening as well as gait with less assist. Pt able to ambulate close to 100 ft without UE assist but with CGA from PT to help pt feel safe. LOB towards end of 100 ft secondary to pt being startled for reminding her to keep performing hip and knee flexion to maintain foot clearance. Improved ability to ambulate without UE assist with increased balance confidence. Pt will benefit from continued skilled physical therapy services to improve LE strength, balance, function, and ability to ambulate.     Personal Factors and Comorbidities  Age;Comorbidity 3+;Past/Current Experience;Fitness;Time  since onset of injury/illness/exacerbation    Comorbidities   pacemaker, difficulty with healing of L elbow wound, peripheral vascular disease, hx of back surgery    Examination-Activity Limitations  Carry;Lift;Locomotion Level    Stability/Clinical Decision Making  Evolving/Moderate complexity    Rehab Potential  Fair    Clinical Impairments Affecting Rehab Potential  (-) chronicity of condition, age, comorbidities; (+) motivated    PT Frequency  2x / week    PT Duration  8 weeks    PT Treatment/Interventions  Aquatic Therapy;Gait training;Therapeutic activities;Therapeutic exercise;Balance training;Neuromuscular re-education;Patient/family education;Manual techniques;Dry needling    PT Next Visit Plan  trunk, hip, scapular strengthening, balance, gait, manual  techniques PRN    PT Home Exercise Plan  Sit<>stand, standing hip ext and abd, bridging, and heel raises    Consulted and Agree with Plan of Care  Patient       Patient will benefit from skilled therapeutic intervention in order to improve the following deficits and impairments:  Abnormal gait, Decreased activity tolerance, Decreased balance, Decreased endurance, Decreased range of motion, Decreased strength, Difficulty walking, Improper body mechanics, Postural dysfunction, Pain, Decreased knowledge of use of DME  Visit Diagnosis: Unsteadiness on feet  Difficulty in walking, not elsewhere classified  Muscle weakness (generalized)  History of falling     Problem List Patient Active Problem List   Diagnosis Date Noted  . Septic olecranon bursitis of left elbow 03/27/2018  . Lymphedema 10/11/2017  . Chronic venous insufficiency 10/11/2017  . Leg pain 10/11/2017  . Leg swelling 10/11/2017  . COPD (chronic obstructive pulmonary disease) (HCC) 10/11/2017  . Essential hypertension 10/11/2017    Loralyn FreshwaterMiguel Eyad Rochford PT, DPT   04/30/2019, 5:24 PM  [facility] [address] [place], [place], [address] Phone: [phone]   Fax:  [phone]  Name: [name] MRN: [id number] Date of Birth: 03/27/1930

## 2019-05-01 ENCOUNTER — Encounter: Payer: Medicare Other | Admitting: Internal Medicine

## 2019-05-02 ENCOUNTER — Ambulatory Visit: Payer: Medicare Other

## 2019-05-08 ENCOUNTER — Ambulatory Visit: Payer: Medicare Other | Admitting: Internal Medicine

## 2019-05-09 ENCOUNTER — Ambulatory Visit: Payer: Medicare Other

## 2019-05-14 ENCOUNTER — Other Ambulatory Visit: Payer: Self-pay

## 2019-05-14 ENCOUNTER — Ambulatory Visit: Payer: Medicare Other | Attending: Neurology

## 2019-05-14 DIAGNOSIS — M6281 Muscle weakness (generalized): Secondary | ICD-10-CM | POA: Diagnosis present

## 2019-05-14 DIAGNOSIS — R262 Difficulty in walking, not elsewhere classified: Secondary | ICD-10-CM

## 2019-05-14 DIAGNOSIS — M545 Low back pain: Secondary | ICD-10-CM | POA: Diagnosis present

## 2019-05-14 DIAGNOSIS — G8929 Other chronic pain: Secondary | ICD-10-CM | POA: Insufficient documentation

## 2019-05-14 DIAGNOSIS — Z9181 History of falling: Secondary | ICD-10-CM

## 2019-05-14 DIAGNOSIS — R2681 Unsteadiness on feet: Secondary | ICD-10-CM | POA: Insufficient documentation

## 2019-05-14 NOTE — Therapy (Signed)
[facility] PHYSICAL AND SPORTS MEDICINE [address] [place], [place], [address] Phone: [phone]   Fax:  [phone]  Physical Therapy Treatment  Patient Details  Name: [name] MRN: [id number] Date of Birth: 01/02/1930 Referring Provider (PT): Cristopher PeruHemang Shah, MD   Encounter Date: 05/14/2019  PT End of Session - 05/14/19 1410    Visit Number  42    Number of Visits  78    Date for PT Re-Evaluation  06/13/19    Authorization Type  2    Authorization Time Period  of 10 progress report    PT Start Time  1401    PT Stop Time  1444    PT Time Calculation (min)  43 min    Equipment Utilized During Treatment  Gait belt   RW   Activity Tolerance  Patient tolerated treatment well;Patient limited by fatigue    Behavior During Therapy  [facility] for tasks assessed/performed       Past Medical History:  Diagnosis Date  . Anemia   . GERD (gastroesophageal reflux disease)   . Hypertension   . Peripheral vascular disease (HCC)    possible neuropathies in lower extremeties  . PONV (postoperative nausea and vomiting)    happens sometimes but better with pre med of zofran  . Presence of permanent cardiac pacemaker   . Syncope     Past Surgical History:  Procedure Laterality Date  . ABDOMINAL HYSTERECTOMY  1969  . APPENDECTOMY  1969   with hysterectomy  . BACK SURGERY  2001   rods in back. surgery on back x 3  . COLONOSCOPY    . I&D EXTREMITY Left 03/27/2018   Procedure: IRRIGATION AND DEBRIDEMENT LEFT ELBOW / OLECRANON BURSA;  Surgeon: Christena FlakePoggi, John J, MD;  Location: [facility] ORS;  Service: Orthopedics;  Laterality: Left;  . I&D EXTREMITY Left 06/05/2018   Procedure: IRRIGATION AND DEBRIDEMENT EXTREMITY;  Surgeon: Christena FlakePoggi, John J, MD;  Location: [facility] OR[facility];  Service: Orthopedics;  Laterality: Left;  . INSERT / REPLACE / REMOVE PACEMAKER  2004  . JOINT REPLACEMENT Left 2004   partial knee replacement  . OLECRANON BURSECTOMY Left 03/27/2018   Procedure:  LEFT OLECRANON BURSA;  Surgeon: Christena FlakePoggi, John J, MD;  Location: [facility] ORS;  Service: Orthopedics;  Laterality: Left;  . PACEMAKER INSERTION  2014   dual chamber for complete heart block    There were no vitals filed for this visit.  Subjective Assessment - 05/14/19 1403    Subjective  Pt states that her methotrexate was increased to 8 mL. Pt was told that prednisone and methotrexate can slow down the healing.  The hole in [name] was closed surgically 3 weeks ago. Getting the stitches out this Friday.  L hand is swollen.  Walking is not super good but she is walking.  Does not think that the balance is that much better. Has been walking and tries to walk inside her house without holding onto anything but cautiously.  L knee feels weak when she is on it.     Pertinent History  Imparied gait. Pt states that she has had balance issues for too long. Feels more comfortable walking with her rw. Had tests which showed that she has neuropathy. However [facility] that her balance is coming from her back. Just wants to be strong so she can walk.  Started walking with her rw off and on for the past several months.  Also feels  like she might need a new sleep number bed. Also had 2 wound surgeries at [name]. Pt fell in February 2019. Had to stop PT due to the L elbow surgery April 06, 2018 to remove a bursa. The wound still has not healed.   Currently sees a wound doctor.   Feels numbness L foot from the ankle down.  No R foot numbness.  Sometimes unsure of where her L LE is in space.  Pt also states that she tires too quickly.  Has not been able to walk her dog in 2 years. Has not really been doing exercises at home except the sit <> stand. Pt states that prolonged sitting without back support will increase back pain     Limitations  Standing;Walking    How long can you sit comfortably?  1 hr    How long can you stand comfortably?  10 minutes    How long can you walk comfortably?  2 or 3  blocks    Diagnostic tests  EMG testing BLEs and BUEs which were negative    Patient Stated Goals  Improve LE strength. Wants to use the machines at the gym. Walk more steadily. Be able to take her dog (16 lbs) out on a leash.     Currently in Pain?  Other (Comment)    Pain Score  --   no complain of pain   Pain Onset  More than a month ago                               PT Education - 05/14/19 1410    Education provided  Yes    Education Details  ther-ex    [employer]) Educated  Patient    Methods  Explanation;Demonstration;Tactile cues;Verbal cues    Comprehension  Verbalized understanding;Returned demonstration      OBJECTIVE  Pt ambulating with rw   L elbow wound  No latex band allergies    MedbridgeAccess Code: [email or web address]   Pt has pacemaker   Therapeutic exercise:   seated trunk extension isometrics at neutral, manually resisted 10x5 seconds for 3 sets  Decreased L knee achy weakness sensation with gait  Gait around gym, handheld assist 200 ft. No LOB.    Walking and stepping over shoe box with PT hand held assist. Min to mod A.              R 5x             L 5x             Cues to not circumduct and to improve foot clearance  LOB x 1 secondary to decreased foot clearance, mod A to recover.   Standing hip abduction with B UE assist 3 lbs ankle weight R 10x3 L 10x3  Leg press seat 2,  B LE plate 45 for 23F5   Forward step up onto Air Ex pad with one UE assist  R 10x3  L 10x3  1 standing rest break before last set of 10   Gait without UE assist but CGA from PT 100 ft. R UE light touch furniture assist 3x No LOB               Improved exercise technique, movement at target joints, use of target muscles after min to mod verbal, visual, tactile cues.     Patient response to treatment: Improved ability to ambulate without UE  assist and no LOB with  improved balance confidence observed.    Clinical impression Continued working on LE strengthening to promote ability to support herself in standing and walking with less need for assistance. Some difficulty with stepping over a shoe box secondary to decreased foot clearance one time resulting in LOB. Pt able to ambulated without UE assist about 100 ft with increased balance confidence observed. Pt tolerated session well without aggravation of symptoms. Pt will benefit from continued skilled physical therapy services to improve LE strength, balance, ability to perform standing tasks and decrease difficulty ambulating.       PT Short Term Goals - 04/25/19 1723      PT SHORT TERM GOAL #1   Title  Patient will be independent with her HEP to improve strength and balance.     Time  3    Period  Weeks    Status  On-going    Target Date  05/08/19        PT Long Term Goals - 04/25/19 1455      PT LONG TERM GOAL #1   Title  Patient will improve her 10 MWT to 0.8 m/s or more with rw to promote better community ambulation.     Baseline  0.5 seconds with rw (09/06/2018); 0.63 seconds average with rw (10/18/2018); 0.69 m/s average using rw (12/10/2018); 0.59 m/s using SPC and CGA (01/08/2019); 0.60 m/s using SPC (01/21/2019); 0.56 m/s with SPC to no AD assist, CGA (02/04/2019); 0.45 m/s average (04/17/2019)    Time  8    Period  Weeks    Status  On-going    Target Date  06/13/19      PT LONG TERM GOAL #2   Title  Patient will improve her DGI score using rw or least restrictive AD to 19/24 or more to promote balance.     Baseline  13/24 using rw (09/06/2018); 14/24 using rw (10/29/2018); 16/24 (12/10/2018); 14/24 using SPC (01/21/2019); 9/24 using SPC (04/17/2019)     Time  8    Period  Weeks    Status  On-going    Target Date  06/13/19      PT LONG TERM GOAL #3   Title  Patient will improve bilateral LE strength by at least 1/2 MMT to promote ability to support herself when performing standing  tasks and improve balance.     Time  8    Period  Weeks    Status  On-going    Target Date  06/13/19      PT LONG TERM GOAL #4   Title  Patient will have a decrease in back pain/ache to 5/10 or less to promote ability to perform standing tasks with less difficulty.    Baseline  8/10 at most for the past 3 months (09/06/2018); 5-6/10 at worst for the past 7 days (10/18/2018); 5/10 at worst for the past 7 days (12/10/2018)    Time  6    Period  Weeks    Status  Achieved    Target Date  01/24/19            Plan - 05/14/19 1425    Clinical Impression Statement  Continued working on LE strengthening to promote ability to support herself in standing and walking with less need for assistance. Some difficulty with stepping over a shoe box secondary to decreased foot clearance one time resulting in LOB. Pt able to ambulated without UE assist about 100 ft with increased balance confidence observed.  Pt tolerated session well without aggravation of symptoms. Pt will benefit from continued skilled physical therapy services to improve LE strength, balance, ability to perform standing tasks and decrease difficulty ambulating.     Personal Factors and Comorbidities  Age;Comorbidity 3+;Past/Current Experience;Fitness;Time since onset of injury/illness/exacerbation    Comorbidities   pacemaker, difficulty with healing of L elbow wound, peripheral vascular disease, hx of back surgery    Examination-Activity Limitations  Carry;Lift;Locomotion Level    Stability/Clinical Decision Making  Evolving/Moderate complexity    Rehab Potential  Fair    Clinical Impairments Affecting Rehab Potential  (-) chronicity of condition, age, comorbidities; (+) motivated    PT Frequency  2x / week    PT Duration  8 weeks    PT Treatment/Interventions  Aquatic Therapy;Gait training;Therapeutic activities;Therapeutic exercise;Balance training;Neuromuscular re-education;Patient/family education;Manual techniques;Dry needling     PT Next Visit Plan  trunk, hip, scapular strengthening, balance, gait, manual techniques PRN    PT Home Exercise Plan  Sit<>stand, standing hip ext and abd, bridging, and heel raises    Consulted and Agree with Plan of Care  Patient       Patient will benefit from skilled therapeutic intervention in order to improve the following deficits and impairments:  Abnormal gait, Decreased activity tolerance, Decreased balance, Decreased endurance, Decreased range of motion, Decreased strength, Difficulty walking, Improper body mechanics, Postural dysfunction, Pain, Decreased knowledge of use of DME  Visit Diagnosis: Unsteadiness on feet  Difficulty in walking, not elsewhere classified  Muscle weakness (generalized)  History of falling     Problem List Patient Active Problem List   Diagnosis Date Noted  . Septic olecranon bursitis of left elbow 03/27/2018  . Lymphedema 10/11/2017  . Chronic venous insufficiency 10/11/2017  . Leg pain 10/11/2017  . Leg swelling 10/11/2017  . COPD (chronic obstructive pulmonary disease) (HCC) 10/11/2017  . Essential hypertension 10/11/2017    Loralyn Freshwater PT, DPT   05/14/2019, 2:58 PM  [facility] PHYSICAL AND SPORTS MEDICINE [address] [place], [address] Phone: [phone]   Fax:  [phone]  Name: [name] MRN: [id number] Date of Birth: 1930/11/04

## 2019-05-14 NOTE — Patient Instructions (Addendum)
Gave green theraband (upgrade from red) for pt supine clamshell home exercise

## 2019-05-16 ENCOUNTER — Ambulatory Visit: Payer: Medicare Other

## 2019-05-21 ENCOUNTER — Other Ambulatory Visit: Payer: Self-pay

## 2019-05-21 ENCOUNTER — Ambulatory Visit: Payer: Medicare Other

## 2019-05-21 DIAGNOSIS — Z9181 History of falling: Secondary | ICD-10-CM

## 2019-05-21 DIAGNOSIS — R262 Difficulty in walking, not elsewhere classified: Secondary | ICD-10-CM

## 2019-05-21 DIAGNOSIS — R2681 Unsteadiness on feet: Secondary | ICD-10-CM

## 2019-05-21 DIAGNOSIS — M6281 Muscle weakness (generalized): Secondary | ICD-10-CM

## 2019-05-21 NOTE — Therapy (Signed)
[facility] PHYSICAL AND SPORTS MEDICINE [address] [place], [place], [address] Phone: [phone]   Fax:  [phone]  Physical Therapy Treatment  Patient Details  Name: [name] MRN: [id number] Date of Birth: 11/04/1930 Referring Provider (PT): Cristopher PeruHemang Shah, MD   Encounter Date: 05/21/2019  PT End of Session - 05/21/19 1404    Visit Number  43    Number of Visits  78    Date for PT Re-Evaluation  06/13/19    Authorization Type  3    Authorization Time Period  of 10 progress report    PT Start Time  1404    PT Stop Time  1449    PT Time Calculation (min)  45 min    Equipment Utilized During Treatment  Gait belt   RW   Activity Tolerance  Patient tolerated treatment well;Patient limited by fatigue    Behavior During Therapy  [facility] for tasks assessed/performed       Past Medical History:  Diagnosis Date  . Anemia   . GERD (gastroesophageal reflux disease)   . Hypertension   . Peripheral vascular disease (HCC)    possible neuropathies in lower extremeties  . PONV (postoperative nausea and vomiting)    happens sometimes but better with pre med of zofran  . Presence of permanent cardiac pacemaker   . Syncope     Past Surgical History:  Procedure Laterality Date  . ABDOMINAL HYSTERECTOMY  1969  . APPENDECTOMY  1969   with hysterectomy  . BACK SURGERY  2001   rods in back. surgery on back x 3  . COLONOSCOPY    . I&D EXTREMITY Left 03/27/2018   Procedure: IRRIGATION AND DEBRIDEMENT LEFT ELBOW / OLECRANON BURSA;  Surgeon: Christena FlakePoggi, John J, MD;  Location: [facility] OR[facility];  Service: Orthopedics;  Laterality: Left;  . I&D EXTREMITY Left 06/05/2018   Procedure: IRRIGATION AND DEBRIDEMENT EXTREMITY;  Surgeon: Christena FlakePoggi, John J, MD;  Location: [facility] OR[facility];  Service: Orthopedics;  Laterality: Left;  . INSERT / REPLACE / REMOVE PACEMAKER  2004  . JOINT REPLACEMENT Left 2004   partial knee replacement  . OLECRANON BURSECTOMY Left 03/27/2018   Procedure:  LEFT OLECRANON BURSA;  Surgeon: Christena FlakePoggi, John J, MD;  Location: [facility] ORS;  Service: Orthopedics;  Laterality: Left;  . PACEMAKER INSERTION  2014   dual chamber for complete heart block    There were no vitals filed for this visit.  Subjective Assessment - 05/21/19 1407    Subjective  Getting the stitches on her elbow off this Friday. Walking is shaky in the morning. Gets better as the day goes on. Not where she wants to be.     Pertinent History  Imparied gait. Pt states that she has had balance issues for too long. Feels more comfortable walking with her rw. Had tests which showed that she has neuropathy. However [facility] said that her balance is coming from her back. Just wants to be strong so she can walk.  Started walking with her rw off and on for the past several months.  Also feels like she might need a new sleep number bed. Also had 2 wound surgeries at [name]. Pt fell in February 2019. Had to stop PT due to the L elbow surgery April 06, 2018 to remove a bursa. The wound still has not healed.   Currently sees a wound doctor.   Feels numbness L foot from the ankle down.  No  R foot numbness.  Sometimes unsure of where her L LE is in space.  Pt also states that she tires too quickly.  Has not been able to walk her dog in 2 years. Has not really been doing exercises at home except the sit <> stand. Pt states that prolonged sitting without back support will increase back pain     Limitations  Standing;Walking    How long can you sit comfortably?  1 hr    How long can you stand comfortably?  10 minutes    How long can you walk comfortably?  2 or 3 blocks    Diagnostic tests  EMG testing BLEs and BUEs which were negative    Patient Stated Goals  Improve LE strength. Wants to use the machines at the gym. Walk more steadily. Be able to take her dog (16 lbs) out on a leash.     Currently in Pain?  No/denies    Pain Score  0-No pain    Pain Onset  More than a month ago                                PT Education - 05/21/19 1408    Education provided  Yes    Education Details  ther-ex    [employer]) Educated  Patient    Methods  Explanation;Demonstration;Tactile cues;Verbal cues    Comprehension  Returned demonstration;Verbalized understanding         OBJECTIVE  Pt ambulating with rw   L elbow wound  No latex band allergies    MedbridgeAccess Code: [email or web address]   Pt has pacemaker   Therapeutic exercise:  Gait around gym, with SPC and CGA 150 ft. No LOB. Pt states she feels more confident with PT hand held assist   Then 200 ft with SPC, CGA. LOB x 2 with PT min A to recover. Pt states that she feels like she did not pick her feet up.     seated trunk extension isometrics at neutral, manually resisted 10x5 seconds for 3 sets             Standing hip abduction with B UE assist 4 lbs (upgrade) ankle weight to promote glute med muscle strength R 5x4 L 5x4  Leg press seat 2,  B LE plate 45 for 01B5  Walking and stepping over shoe box with PT hand held assist. Min to mod A.  R 6x L 6x LOB x 2 with mod A to recover secondary to decreased foot clearance  Standing hip flexion with ankle DF with one UE assist to promote foot clearance             R 10x2  L 10x2  Forward step up onto 4 inch step with one UE assist   R 10x2  L 10x2    Improved exercise technique, movement at target joints, use of target muscles after min to mod verbal, visual, tactile cues.    Patient response to treatment: Good muscle use felt with exercises. Slight difficulty with picking her feet up after seated leg press exercise which returned to normal after seated rest break. No complain of increased pain throughout session.    Clinical impression Continued working on LE strength to promote ability to support herself when standing and walking.  Worked on increasing foot clearance and stepping over obstacles to decrease fall risk and improve balance and balance confidence. LOB x 2 when trying  to step over a shoe box secondary to needing more foot clearance. Pt also demonstrates difficulty ambulating more than 200 ft secondary to difficulty with endurance, needing rest breaks. Pt will benefit from continued skilled physical therapy services to improve LE strength, balance, function, and ability to ambulate.       PT Short Term Goals - 04/25/19 1723      PT SHORT TERM GOAL #1   Title  Patient will be independent with her HEP to improve strength and balance.     Time  3    Period  Weeks    Status  On-going    Target Date  05/08/19        PT Long Term Goals - 04/25/19 1455      PT LONG TERM GOAL #1   Title  Patient will improve her 10 MWT to 0.8 m/s or more with rw to promote better community ambulation.     Baseline  0.5 seconds with rw (09/06/2018); 0.63 seconds average with rw (10/18/2018); 0.69 m/s average using rw (12/10/2018); 0.59 m/s using SPC and CGA (01/08/2019); 0.60 m/s using SPC (01/21/2019); 0.56 m/s with SPC to no AD assist, CGA (02/04/2019); 0.45 m/s average (04/17/2019)    Time  8    Period  Weeks    Status  On-going    Target Date  06/13/19      PT LONG TERM GOAL #2   Title  Patient will improve her DGI score using rw or least restrictive AD to 19/24 or more to promote balance.     Baseline  13/24 using rw (09/06/2018); 14/24 using rw (10/29/2018); 16/24 (12/10/2018); 14/24 using SPC (01/21/2019); 9/24 using SPC (04/17/2019)     Time  8    Period  Weeks    Status  On-going    Target Date  06/13/19      PT LONG TERM GOAL #3   Title  Patient will improve bilateral LE strength by at least 1/2 MMT to promote ability to support herself when performing standing tasks and improve balance.     Time  8    Period  Weeks    Status  On-going    Target Date  06/13/19      PT LONG TERM GOAL #4   Title  Patient will have a  decrease in back pain/ache to 5/10 or less to promote ability to perform standing tasks with less difficulty.    Baseline  8/10 at most for the past 3 months (09/06/2018); 5-6/10 at worst for the past 7 days (10/18/2018); 5/10 at worst for the past 7 days (12/10/2018)    Time  6    Period  Weeks    Status  Achieved    Target Date  01/24/19            Plan - 05/21/19 1423    Clinical Impression Statement  Continued working on LE strength to promote ability to support herself when standing and walking. Worked on increasing foot clearance and stepping over obstacles to decrease fall risk and improve balance and balance confidence. LOB x 2 when trying to step over a shoe box secondary to needing more foot clearance. Pt also demonstrates difficulty ambulating more than 200 ft secondary to difficulty with endurance, needing rest breaks. Pt will benefit from continued skilled physical therapy services to improve LE strength, balance, function, and ability to ambulate.     Personal Factors and Comorbidities  Age;Comorbidity 3+;Past/Current Experience;Fitness;Time since onset of injury/illness/exacerbation  Comorbidities   pacemaker, difficulty with healing of L elbow wound, peripheral vascular disease, hx of back surgery    Examination-Activity Limitations  Carry;Lift;Locomotion Level    Stability/Clinical Decision Making  Evolving/Moderate complexity    Rehab Potential  Fair    Clinical Impairments Affecting Rehab Potential  (-) chronicity of condition, age, comorbidities; (+) motivated    PT Frequency  2x / week    PT Duration  8 weeks    PT Treatment/Interventions  Aquatic Therapy;Gait training;Therapeutic activities;Therapeutic exercise;Balance training;Neuromuscular re-education;Patient/family education;Manual techniques;Dry needling    PT Next Visit Plan  trunk, hip, scapular strengthening, balance, gait, manual techniques PRN    PT Home Exercise Plan  Sit<>stand, standing hip ext and abd,  bridging, and heel raises    Consulted and Agree with Plan of Care  Patient       Patient will benefit from skilled therapeutic intervention in order to improve the following deficits and impairments:  Abnormal gait, Decreased activity tolerance, Decreased balance, Decreased endurance, Decreased range of motion, Decreased strength, Difficulty walking, Improper body mechanics, Postural dysfunction, Pain, Decreased knowledge of use of DME  Visit Diagnosis: Unsteadiness on feet  Difficulty in walking, not elsewhere classified  Muscle weakness (generalized)  History of falling     Problem List Patient Active Problem List   Diagnosis Date Noted  . Septic olecranon bursitis of left elbow 03/27/2018  . Lymphedema 10/11/2017  . Chronic venous insufficiency 10/11/2017  . Leg pain 10/11/2017  . Leg swelling 10/11/2017  . COPD (chronic obstructive pulmonary disease) ([place]) 10/11/2017  . Essential hypertension 10/11/2017    Joneen Boers PT, DPT   05/21/2019, 3:21 PM  [place] PHYSICAL AND SPORTS MEDICINE [address], [place], [address] Phone: [phone]   Fax:  [phone]  Name: [name] MRN: [id number] Date of Birth: [birth year]

## 2019-05-23 ENCOUNTER — Other Ambulatory Visit: Payer: Self-pay

## 2019-05-23 ENCOUNTER — Ambulatory Visit: Payer: Medicare Other

## 2019-05-23 DIAGNOSIS — Z9181 History of falling: Secondary | ICD-10-CM

## 2019-05-23 DIAGNOSIS — M6281 Muscle weakness (generalized): Secondary | ICD-10-CM

## 2019-05-23 DIAGNOSIS — R2681 Unsteadiness on feet: Secondary | ICD-10-CM | POA: Diagnosis not present

## 2019-05-23 DIAGNOSIS — R262 Difficulty in walking, not elsewhere classified: Secondary | ICD-10-CM

## 2019-05-23 NOTE — Therapy (Signed)
[place] [facility] PHYSICAL AND SPORTS MEDICINE [address] [place], [place], [address] Phone: [phone]   Fax:  [phone]  Physical Therapy Treatment  Patient Details  Name: Natasha BassetJanice V Barnett MRN: [id number] Date of Birth: 10/28/1930 Referring Provider (PT): Cristopher PeruHemang Shah, MD   Encounter Date: 05/23/2019  PT End of Session - 05/23/19 1504    Visit Number  44    Number of Visits  78    Date for PT Re-Evaluation  06/13/19    Authorization Type  4    Authorization Time Period  of 10 progress report    PT Start Time  1504    PT Stop Time  1547    PT Time Calculation (min)  43 min    Equipment Utilized During Treatment  Gait belt   RW   Activity Tolerance  Patient tolerated treatment well;Patient limited by fatigue    Behavior During Therapy  [facility] for tasks assessed/performed       Past Medical History:  Diagnosis Date  . Anemia   . GERD (gastroesophageal reflux disease)   . Hypertension   . Peripheral vascular disease (HCC)    possible neuropathies in lower extremeties  . PONV (postoperative nausea and vomiting)    happens sometimes but better with pre med of zofran  . Presence of permanent cardiac pacemaker   . Syncope     Past Surgical History:  Procedure Laterality Date  . ABDOMINAL HYSTERECTOMY  1969  . APPENDECTOMY  1969   with hysterectomy  . BACK SURGERY  2001   rods in back. surgery on back x 3  . COLONOSCOPY    . I&D EXTREMITY Left 03/27/2018   Procedure: IRRIGATION AND DEBRIDEMENT LEFT ELBOW / OLECRANON BURSA;  Surgeon: Christena FlakePoggi, John J, MD;  Location: [facility];  Service: Orthopedics;  Laterality: Left;  . I&D EXTREMITY Left 06/05/2018   Procedure: IRRIGATION AND DEBRIDEMENT EXTREMITY;  Surgeon: Christena FlakePoggi, John J, MD;  Location: [facility];  Service: Orthopedics;  Laterality: Left;  . INSERT / REPLACE / REMOVE PACEMAKER  2004  . JOINT REPLACEMENT Left 2004   partial knee replacement  . OLECRANON BURSECTOMY Left 03/27/2018   Procedure:  LEFT OLECRANON BURSA;  Surgeon: Christena FlakePoggi, John J, MD;  Location: [facility];  Service: Orthopedics;  Laterality: Left;  . PACEMAKER INSERTION  2014   dual chamber for complete heart block    There were no vitals filed for this visit.  Subjective Assessment - 05/23/19 1507    Subjective  Feels worn out. Had a busy day.    Pertinent History  Imparied gait. Pt states that she has had balance issues for too long. Feels more comfortable walking with her rw. Had tests which showed that she has neuropathy. However [facility] said that her balance is coming from her back. Just wants to be strong so she can walk.  Started walking with her rw off and on for the past several months.  Also feels like she might need a new sleep number bed. Also had 2 wound surgeries at [name]. Pt fell in February 2019. Had to stop PT due to the L elbow surgery April 06, 2018 to remove a bursa. The wound still has not healed.   Currently sees a wound doctor.   Feels numbness L foot from the ankle down.  No R foot numbness.  Sometimes unsure of where her L LE is in space.  Pt also states that she tires too  quickly.  Has not been able to walk her dog in 2 years. Has not really been doing exercises at home except the sit <> stand. Pt states that prolonged sitting without back support will increase back pain     Limitations  Standing;Walking    How long can you sit comfortably?  1 hr    How long can you stand comfortably?  10 minutes    How long can you walk comfortably?  2 or 3 blocks    Diagnostic tests  EMG testing BLEs and BUEs which were negative    Patient Stated Goals  Improve LE strength. Wants to use the machines at the gym. Walk more steadily. Be able to take her dog (16 lbs) out on a leash.     Currently in Pain?  Other (Comment)   no complain of pain   Pain Onset  More than a month ago                               PT Education - 05/23/19 1513    Education provided  Yes     Education Details  ther-ex    [employer]) Educated  Patient    Methods  Explanation;Demonstration;Tactile cues;Verbal cues    Comprehension  Returned demonstration;Verbalized understanding         OBJECTIVE  Pt ambulating with rw   L elbow wound  No latex band allergies    MedbridgeAccess Code: [email or web address]   Pt has pacemaker   Therapeutic exercise:  Gait around gym, with SPC and CGA 200 ft. No LOB. Pt states she feels more confident with PT hand held assist  Then 150 ft with SPC with CGA. B UE assist 1x. No LOB  seated trunk extension isometrics at neutral, manually resisted 10x5 seconds for 3 sets   Walking and stepping over shoe box with PT hand held assist. Min to mod A.  R 6x L 6x   No LOB. Cues to clear feet when stepping over  Leg press seat 2,  B LE plate 45 for [id number]  Standing hip abduction with B UE assist 4 lbs ankle weight to promote glute med muscle strength R 5x4 L 5x4  Standing hip flexion with ankle DF with one UE assist to promote foot clearance R 10x2             L 10x2   Forward step up onto 4 inch step with one UE assist              R 10x2             L 10x2    Improved exercise technique, movement at target joints, use of target muscles aftermin tomod verbal, visual, tactile cues.    Patient response to treatment: Good muscle use felt with exercises.  No complain of increased pain throughout session.    Clinical impression  Continued working on LE strengthening and obstacle negotiation to improve balance, decrease fall risk and decrease difficulty ambulating. Able to step over shoe box obstacle today without LOB with PT min to mod A. Demonstrates improved ability to ambulate without UE assist with increased balance confidence. Pt will benefit from continued skilled physical therapy services to improve LE strength,  balance, function, and ability to ambulate.      PT Short Term Goals - 04/25/19 1723      PT SHORT TERM GOAL #1   Title  Patient will  be independent with her HEP to improve strength and balance.     Time  3    Period  Weeks    Status  On-going    Target Date  05/08/19        PT Long Term Goals - 04/25/19 1455      PT LONG TERM GOAL #1   Title  Patient will improve her 10 MWT to 0.8 m/s or more with rw to promote better community ambulation.     Baseline  0.5 seconds with rw (09/06/2018); 0.63 seconds average with rw (10/18/2018); 0.69 m/s average using rw (12/10/2018); 0.59 m/s using SPC and CGA (01/08/2019); 0.60 m/s using SPC (01/21/2019); 0.56 m/s with SPC to no AD assist, CGA (02/04/2019); 0.45 m/s average (04/17/2019)    Time  8    Period  Weeks    Status  On-going    Target Date  06/13/19      PT LONG TERM GOAL #2   Title  Patient will improve her DGI score using rw or least restrictive AD to 19/24 or more to promote balance.     Baseline  13/24 using rw (09/06/2018); 14/24 using rw (10/29/2018); 16/24 (12/10/2018); 14/24 using SPC (01/21/2019); 9/24 using SPC (04/17/2019)     Time  8    Period  Weeks    Status  On-going    Target Date  06/13/19      PT LONG TERM GOAL #3   Title  Patient will improve bilateral LE strength by at least 1/2 MMT to promote ability to support herself when performing standing tasks and improve balance.     Time  8    Period  Weeks    Status  On-going    Target Date  06/13/19      PT LONG TERM GOAL #4   Title  Patient will have a decrease in back pain/ache to 5/10 or less to promote ability to perform standing tasks with less difficulty.    Baseline  8/10 at most for the past 3 months (09/06/2018); 5-6/10 at worst for the past 7 days (10/18/2018); 5/10 at worst for the past 7 days (12/10/2018)    Time  6    Period  Weeks    Status  Achieved    Target Date  01/24/19            Plan - 05/23/19 1504    Clinical Impression Statement  Continued  working on LE strengthening and obstacle negotiation to improve balance, decrease fall risk and decrease difficulty ambulating. Able to step over shoe box obstacle today without LOB with PT min to mod A. Demonstrates improved ability to ambulate without UE assist with increased balance confidence. Pt will benefit from continued skilled physical therapy services to improve LE strength, balance, function, and ability to ambulate.    Personal Factors and Comorbidities  Age;Comorbidity 3+;Past/Current Experience;Fitness;Time since onset of injury/illness/exacerbation    Comorbidities   pacemaker, difficulty with healing of L elbow wound, peripheral vascular disease, hx of back surgery    Examination-Activity Limitations  Carry;Lift;Locomotion Level    Stability/Clinical Decision Making  Evolving/Moderate complexity    Rehab Potential  Fair    Clinical Impairments Affecting Rehab Potential  (-) chronicity of condition, age, comorbidities; (+) motivated    PT Frequency  2x / week    PT Duration  8 weeks    PT Treatment/Interventions  Aquatic Therapy;Gait training;Therapeutic activities;Therapeutic exercise;Balance training;Neuromuscular re-education;Patient/family education;Manual techniques;Dry needling    PT Next Visit  Plan  trunk, hip, scapular strengthening, balance, gait, manual techniques PRN    PT Home Exercise Plan  Sit<>stand, standing hip ext and abd, bridging, and heel raises    Consulted and Agree with Plan of Care  Patient       Patient will benefit from skilled therapeutic intervention in order to improve the following deficits and impairments:  Abnormal gait, Decreased activity tolerance, Decreased balance, Decreased endurance, Decreased range of motion, Decreased strength, Difficulty walking, Improper body mechanics, Postural dysfunction, Pain, Decreased knowledge of use of DME  Visit Diagnosis: Difficulty in walking, not elsewhere classified - Plan:   Muscle weakness (generalized) -  Plan:   History of falling - Plan:  Unsteadiness on feet - Plan:      Problem List Patient Active Problem List   Diagnosis Date Noted  . Septic olecranon bursitis of left elbow 03/27/2018  . Lymphedema 10/11/2017  . Chronic venous insufficiency 10/11/2017  . Leg pain 10/11/2017  . Leg swelling 10/11/2017  . COPD (chronic obstructive pulmonary disease) (HCC) 10/11/2017  . Essential hypertension 10/11/2017    Loralyn Freshwater PT, DPT   05/23/2019, 5:30 PM  [place] [facility] PHYSICAL AND SPORTS MEDICINE [address] [place], [address] Phone: [phone]   Fax:  [phone]  Name: [name] MRN: [id number] Date of Birth: Aug 01, 1930

## 2019-05-28 ENCOUNTER — Other Ambulatory Visit: Payer: Self-pay

## 2019-05-28 ENCOUNTER — Ambulatory Visit: Payer: Medicare Other

## 2019-05-28 DIAGNOSIS — R2681 Unsteadiness on feet: Secondary | ICD-10-CM | POA: Diagnosis not present

## 2019-05-28 DIAGNOSIS — M6281 Muscle weakness (generalized): Secondary | ICD-10-CM

## 2019-05-28 DIAGNOSIS — Z9181 History of falling: Secondary | ICD-10-CM

## 2019-05-28 DIAGNOSIS — R262 Difficulty in walking, not elsewhere classified: Secondary | ICD-10-CM

## 2019-05-28 NOTE — Therapy (Signed)
[place] [facility] PHYSICAL AND SPORTS MEDICINE [address] [place], [place], [address] Phone: [phone]   Fax:  [phone]  Physical Therapy Treatment  Patient Details  Name: [name] MRN: [id number] Date of Birth: 08/09/1930 Referring Provider (PT): Cristopher PeruHemang Shah, MD   Encounter Date: 05/28/2019  PT End of Session - 05/28/19 1406    Visit Number  45    Number of Visits  78    Date for PT Re-Evaluation  06/13/19    Authorization Type  5    Authorization Time Period  of 10 progress report    PT Start Time  1406    PT Stop Time  1446    PT Time Calculation (min)  40 min    Equipment Utilized During Treatment  Gait belt   RW   Activity Tolerance  Patient tolerated treatment well;Patient limited by fatigue    Behavior During Therapy  [facility] for tasks assessed/performed       Past Medical History:  Diagnosis Date  . Anemia   . GERD (gastroesophageal reflux disease)   . Hypertension   . Peripheral vascular disease (HCC)    possible neuropathies in lower extremeties  . PONV (postoperative nausea and vomiting)    happens sometimes but better with pre med of zofran  . Presence of permanent cardiac pacemaker   . Syncope     Past Surgical History:  Procedure Laterality Date  . ABDOMINAL HYSTERECTOMY  1969  . APPENDECTOMY  1969   with hysterectomy  . BACK SURGERY  2001   rods in back. surgery on back x 3  . COLONOSCOPY    . I&D EXTREMITY Left 03/27/2018   Procedure: IRRIGATION AND DEBRIDEMENT LEFT ELBOW / OLECRANON BURSA;  Surgeon: Christena FlakePoggi, John J, MD;  Location: [facility];  Service: Orthopedics;  Laterality: Left;  . I&D EXTREMITY Left 06/05/2018   Procedure: IRRIGATION AND DEBRIDEMENT EXTREMITY;  Surgeon: Christena FlakePoggi, John J, MD;  Location: [facility];  Service: Orthopedics;  Laterality: Left;  . INSERT / REPLACE / REMOVE PACEMAKER  2004  . JOINT REPLACEMENT Left 2004   partial knee replacement  . OLECRANON BURSECTOMY Left 03/27/2018   Procedure:  LEFT OLECRANON BURSA;  Surgeon: Christena FlakePoggi, John J, MD;  Location: [facility];  Service: Orthopedics;  Laterality: Left;  . PACEMAKER INSERTION  2014   dual chamber for complete heart block    There were no vitals filed for this visit.  Subjective Assessment - 05/28/19 1408    Subjective  Did not have time to put [name] brace on. Was told to wear her elbow splint all the time.    Pertinent History  Imparied gait. Pt states that she has had balance issues for too long. Feels more comfortable walking with her rw. Had tests which showed that she has neuropathy. However [facility] said that her balance is coming from her back. Just wants to be strong so she can walk.  Started walking with her rw off and on for the past several months.  Also feels like she might need a new sleep number bed. Also had 2 wound surgeries at [name]. Pt fell in February 2019. Had to stop PT due to the L elbow surgery April 06, 2018 to remove a bursa. The wound still has not healed.   Currently sees a wound doctor.   Feels numbness L foot from the ankle down.  No R foot numbness.  Sometimes unsure of where  her L LE is in space.  Pt also states that she tires too quickly.  Has not been able to walk her dog in 2 years. Has not really been doing exercises at home except the sit <> stand. Pt states that prolonged sitting without back support will increase back pain     Limitations  Standing;Walking    How long can you sit comfortably?  1 hr    How long can you stand comfortably?  10 minutes    How long can you walk comfortably?  2 or 3 blocks    Diagnostic tests  EMG testing BLEs and BUEs which were negative    Patient Stated Goals  Improve LE strength. Wants to use the machines at the gym. Walk more steadily. Be able to take her dog (16 lbs) out on a leash.     Currently in Pain?  No/denies    Pain Onset  More than a month ago                               PT Education - 05/28/19 1411     Education provided  Yes    Education Details  ther-ex    [employer]) Educated  Patient    Methods  Explanation;Demonstration;Tactile cues;Verbal cues    Comprehension  Returned demonstration;Verbalized understanding          OBJECTIVE  Pt ambulating with rw   L elbow wound  No latex band allergies    [employer] Code: [email or web address]   Pt has pacemaker   Therapeutic exercise:  PT provided assist for sit <> stand activities to not use L UE secondary to pt did not wear her brace to protect L elbow  seated trunk extension isometrics at neutral, manually resisted 10x5 seconds for 3 sets  Standing hip abduction with B UE assist4lbsankle weight to promote glute med muscle strength R10x2 L10x2  Standing mini squat without UE assist 10x3  Gait around gym,with SPC and CGA 150 ft. Cues for increased hip and knee flexion  Forward step up onto 4 inch step with one UE assist  R 10x2 L 10x2  Then 150 ft with SPC with CGA. B UE assist 1x    Seated knee extension 5 lbs  R 10x3  L 10x3  Standing alternating hip flexion with ankle DF R UE assist 10x2 each LE to promote foot clearance.    Improved exercise technique, movement at target joints, use of target muscles after mod verbal, visual, tactile cues.     Patient response to treatment: Good muscle use felt with exercises.  No complain of increased pain throughout session.   Clinical impression Continued working on LE strengthening to promote ability to support herself without UE assist as well as foot clearance to promote ability to step over obstacles without falling. Also continued working on gait with SPC to promote mobility with least restrictive AD. Decreased gait speed and foot clearance at time when ambulating with the [facility], needing cues for hip and knee flexion to promote foot clearance. Pt tolerated session well without aggravation  of symptoms. Pt will benefit from continued skilled physical therapy services to improve LE strength, balance, and function.       PT Short Term Goals - 04/25/19 1723      PT SHORT TERM GOAL #1   Title  Patient will be independent with her HEP to improve strength and balance.     Time  3    Period  Weeks    Status  On-going    Target Date  05/08/19        PT Long Term Goals - 04/25/19 1455      PT LONG TERM GOAL #1   Title  Patient will improve her 10 MWT to 0.8 m/s or more with rw to promote better community ambulation.     Baseline  0.5 seconds with rw (09/06/2018); 0.63 seconds average with rw (10/18/2018); 0.69 m/s average using rw (12/10/2018); 0.59 m/s using SPC and CGA (01/08/2019); 0.60 m/s using SPC (01/21/2019); 0.56 m/s with SPC to no AD assist, CGA (02/04/2019); 0.45 m/s average (04/17/2019)    Time  8    Period  Weeks    Status  On-going    Target Date  06/13/19      PT LONG TERM GOAL #2   Title  Patient will improve her DGI score using rw or least restrictive AD to 19/24 or more to promote balance.     Baseline  13/24 using rw (09/06/2018); 14/24 using rw (10/29/2018); 16/24 (12/10/2018); 14/24 using SPC (01/21/2019); 9/24 using SPC (04/17/2019)     Time  8    Period  Weeks    Status  On-going    Target Date  06/13/19      PT LONG TERM GOAL #3   Title  Patient will improve bilateral LE strength by at least 1/2 MMT to promote ability to support herself when performing standing tasks and improve balance.     Time  8    Period  Weeks    Status  On-going    Target Date  06/13/19      PT LONG TERM GOAL #4   Title  Patient will have a decrease in back pain/ache to 5/10 or less to promote ability to perform standing tasks with less difficulty.    Baseline  8/10 at most for the past 3 months (09/06/2018); 5-6/10 at worst for the past 7 days (10/18/2018); 5/10 at worst for the past 7 days (12/10/2018)    Time  6    Period  Weeks    Status  Achieved    Target Date  01/24/19             Plan - 05/28/19 1406    Clinical Impression Statement  Continued working on LE strengthening to promote ability to support herself without UE assist as well as foot clearance to promote ability to step over obstacles without falling. Also continued working on gait with SPC to promote mobility with least restrictive AD. Decreased gait speed and foot clearance at time when ambulating with the [facility], needing cues for hip and knee flexion to promote foot clearance. Pt tolerated session well without aggravation of symptoms. Pt will benefit from continued skilled physical therapy services to improve LE strength, balance, and function.    Personal Factors and Comorbidities  Age;Comorbidity 3+;Past/Current Experience;Fitness;Time since onset of injury/illness/exacerbation    Comorbidities   pacemaker, difficulty with healing of L elbow wound, peripheral vascular disease, hx of back surgery    Examination-Activity Limitations  Carry;Lift;Locomotion Level    Stability/Clinical Decision Making  Evolving/Moderate complexity    Rehab Potential  Fair    Clinical Impairments Affecting Rehab Potential  (-) chronicity of condition, age, comorbidities; (+) motivated    PT Frequency  2x / week    PT Duration  8 weeks    PT Treatment/Interventions  Aquatic Therapy;Gait training;Therapeutic activities;Therapeutic exercise;Balance training;Neuromuscular re-education;Patient/family  education;Manual techniques;Dry needling    PT Next Visit Plan  trunk, hip, scapular strengthening, balance, gait, manual techniques PRN    PT Home Exercise Plan  Sit<>stand, standing hip ext and abd, bridging, and heel raises    Consulted and Agree with Plan of Care  Patient       Patient will benefit from skilled therapeutic intervention in order to improve the following deficits and impairments:  Abnormal gait, Decreased activity tolerance, Decreased balance, Decreased endurance, Decreased range of motion, Decreased  strength, Difficulty walking, Improper body mechanics, Postural dysfunction, Pain, Decreased knowledge of use of DME  Visit Diagnosis: 1. Difficulty in walking, not elsewhere classified   2. Muscle weakness (generalized)   3. History of falling   4. Unsteadiness on feet        Problem List Patient Active Problem List   Diagnosis Date Noted  . Septic olecranon bursitis of left elbow 03/27/2018  . Lymphedema 10/11/2017  . Chronic venous insufficiency 10/11/2017  . Leg pain 10/11/2017  . Leg swelling 10/11/2017  . COPD (chronic obstructive pulmonary disease) (HCC) 10/11/2017  . Essential hypertension 10/11/2017   [name] PT, DPT   05/28/2019, 3:21 PM  [place] [facility] PHYSICAL AND SPORTS MEDICINE [address] [place], [address] Phone: [phone]   Fax:  [phone]  Name: [name] MRN: [id number] Date of Birth: 11/01/1930

## 2019-05-30 ENCOUNTER — Ambulatory Visit: Payer: Medicare Other

## 2019-06-04 ENCOUNTER — Ambulatory Visit: Payer: Medicare Other

## 2019-06-04 ENCOUNTER — Other Ambulatory Visit: Payer: Self-pay

## 2019-06-04 DIAGNOSIS — R2681 Unsteadiness on feet: Secondary | ICD-10-CM

## 2019-06-04 DIAGNOSIS — Z9181 History of falling: Secondary | ICD-10-CM

## 2019-06-04 DIAGNOSIS — R262 Difficulty in walking, not elsewhere classified: Secondary | ICD-10-CM

## 2019-06-04 DIAGNOSIS — M6281 Muscle weakness (generalized): Secondary | ICD-10-CM

## 2019-06-04 NOTE — Therapy (Signed)
[place] [facility] [address] [place], [address] Phone: [phone]   Fax:  [phone]  Physical Therapy Treatment  Patient Details  Name: [name] MRN: [id number] Date of Birth: 1930/09/29 Referring Provider (PT): Cristopher Peru, MD   Encounter Date: 06/04/2019  PT End of Session - 06/04/19 1404    Visit Number  46    Number of Visits  78    Date for PT Re-Evaluation  06/13/19    Authorization Type  6    Authorization Time Period  of 10 progress report    PT Start Time  1404    PT Stop Time  1445    PT Time Calculation (min)  41 min    Equipment Utilized During Treatment  Gait belt   RW   Activity Tolerance  Patient tolerated treatment well;Patient limited by fatigue    Behavior During Therapy  [facility] for tasks assessed/performed       Past Medical History:  Diagnosis Date  . Anemia   . GERD (gastroesophageal reflux disease)   . Hypertension   . Peripheral vascular disease (HCC)    possible neuropathies in lower extremeties  . PONV (postoperative nausea and vomiting)    happens sometimes but better with pre med of zofran  . Presence of permanent cardiac pacemaker   . Syncope     Past Surgical History:  Procedure Laterality Date  . ABDOMINAL HYSTERECTOMY  1969  . APPENDECTOMY  1969   with hysterectomy  . BACK SURGERY  2001   rods in back. surgery on back x 3  . COLONOSCOPY    . I&D EXTREMITY Left 03/27/2018   Procedure: IRRIGATION AND DEBRIDEMENT LEFT ELBOW / OLECRANON BURSA;  Surgeon: Christena Flake, MD;  Location: [facility] ORS;  Service: Orthopedics;  Laterality: Left;  . I&D EXTREMITY Left 06/05/2018   Procedure: IRRIGATION AND DEBRIDEMENT EXTREMITY;  Surgeon: Christena Flake, MD;  Location: [facility] ORS;  Service: Orthopedics;  Laterality: Left;  . INSERT / REPLACE / REMOVE PACEMAKER  2004  . JOINT REPLACEMENT Left 2004   partial knee replacement  . OLECRANON BURSECTOMY Left 03/27/2018   Procedure:  LEFT OLECRANON BURSA;  Surgeon: Christena Flake, MD;  Location: [facility] ORS;  Service: Orthopedics;  Laterality: Left;  . PACEMAKER INSERTION  2014   dual chamber for complete heart block    There were no vitals filed for this visit.  Subjective Assessment - 06/04/19 1406    Subjective  Doing ok. Feels like she has to get out ot the house more. Having an infusion for osteoporosis tomorrow 06/05/2019. Also seeing her doctor for [name] surgery this Friday.    Pertinent History  Imparied gait. Pt states that she has had balance issues for too long. Feels more comfortable walking with her rw. Had tests which showed that she has neuropathy. However the doctor at [facility] said that her balance is coming from her back. Just wants to be strong so she can walk.  Started walking with her rw off and on for the past several months.  Also feels like she might need a new sleep number bed. Also had 2 wound surgeries at [name]. Pt fell in February 2019. Had to stop PT due to the L elbow surgery April 06, 2018 to remove a bursa. The wound still has not healed.   Currently sees a wound doctor.   Feels numbness L foot from the ankle down.  No R foot numbness.  Sometimes unsure of where her L LE is in space.  Pt also states that she tires too quickly.  Has not been able to walk her dog in 2 years. Has not really been doing exercises at home except the sit <> stand. Pt states that prolonged sitting without back support will increase back pain     Limitations  Standing;Walking    How long can you sit comfortably?  1 hr    How long can you stand comfortably?  10 minutes    How long can you walk comfortably?  2 or 3 blocks    Diagnostic tests  EMG testing BLEs and BUEs which were negative    Patient Stated Goals  Improve LE strength. Wants to use the machines at the gym. Walk more steadily. Be able to take her dog (16 lbs) out on a leash.     Currently in Pain?  Other (Comment)   no complain of pain   Pain Onset   More than a month ago                               PT Education - 06/04/19 1411    Education provided  Yes    Education Details  ther-ex    [employer]) Educated  Patient    Methods  Explanation;Demonstration;Tactile cues;Verbal cues    Comprehension  Returned demonstration;Verbalized understanding        OBJECTIVE  Pt ambulating with rw   L elbow wound  No latex band allergies    MedbridgeAccess Code: [email or web address]   Pt has pacemaker   Therapeutic exercise:  Pt wearing [name] brace   seated trunk extension isometrics at neutral, manually resisted 10x10 seconds (upgrade) for 3 sets  Standing hip abduction with B UE assist4lbsankle weight to promote glute med muscle strength R10x2 L10x2  Gait around gym,with SPC and CGA to SBA 100 ft.  Rest break provided afterwards. Pt states that she seems to tire easily Blood pressure R arm sitting, normal cuff, mechanically taken: 130/49, HR 70  Then 150 ft with SPC, CGA, cues for hip flexion to promote foot clearance.   Standing march with one UE assist 10x3 each LE to promote hip flexion during gait   Stepping over shoe box with PT hand held assist. Min to mod A.  R6x L6x  No LOB  Leg press seat 2,  B LE plate 45 for [id number]    Improved exercise technique, movement at target joints, use of target muscles after min to mod verbal, visual, tactile cues.     Patient response to treatment: Good muscle use felt with exercises. No complain of increased pain throughout session.   Clinical impression Continued working on B LE strength to promote ability to support herself when walking and when performing standing tasks and improve balance. Able to clear shoebox when stepping over it with min to mod A hand held assist. Difficulty ambulating greater than 150 ft with SPC or hand held assist on average in  which decreased endurance may play a factor.  Pt will benefit from continued skilled physical therapy services to improve LE strength, balance, and function.      PT Short Term Goals - 04/25/19 1723      PT SHORT TERM GOAL #1   Title  Patient will be independent with her HEP to improve strength and balance.     Time  3    Period  Weeks    Status  On-going    Target Date  05/08/19        PT Long Term Goals - 04/25/19 1455      PT LONG TERM GOAL #1   Title  Patient will improve her 10 MWT to 0.8 m/s or more with rw to promote better community ambulation.     Baseline  0.5 seconds with rw (09/06/2018); 0.63 seconds average with rw (10/18/2018); 0.69 m/s average using rw (12/10/2018); 0.59 m/s using SPC and CGA (01/08/2019); 0.60 m/s using SPC (01/21/2019); 0.56 m/s with SPC to no AD assist, CGA (02/04/2019); 0.45 m/s average (04/17/2019)    Time  8    Period  Weeks    Status  On-going    Target Date  06/13/19      PT LONG TERM GOAL #2   Title  Patient will improve her DGI score using rw or least restrictive AD to 19/24 or more to promote balance.     Baseline  13/24 using rw (09/06/2018); 14/24 using rw (10/29/2018); 16/24 (12/10/2018); 14/24 using SPC (01/21/2019); 9/24 using SPC (04/17/2019)     Time  8    Period  Weeks    Status  On-going    Target Date  06/13/19      PT LONG TERM GOAL #3   Title  Patient will improve bilateral LE strength by at least 1/2 MMT to promote ability to support herself when performing standing tasks and improve balance.     Time  8    Period  Weeks    Status  On-going    Target Date  06/13/19      PT LONG TERM GOAL #4   Title  Patient will have a decrease in back pain/ache to 5/10 or less to promote ability to perform standing tasks with less difficulty.    Baseline  8/10 at most for the past 3 months (09/06/2018); 5-6/10 at worst for the past 7 days (10/18/2018); 5/10 at worst for the past 7 days (12/10/2018)    Time  6    Period  Weeks    Status   Achieved    Target Date  01/24/19            Plan - 06/04/19 1458    Clinical Impression Statement  Continued working on B LE strength to promote ability to support herself when walking and when performing standing tasks and improve balance. Able to clear shoebox when stepping over it with min to mod A hand held assist. Difficulty ambulating greater than 150 ft with SPC or hand held assist on average in which decreased endurance may play a factor.  Pt will benefit from continued skilled physical therapy services to improve LE strength, balance, and function.    Personal Factors and Comorbidities  Age;Comorbidity 3+;Past/Current Experience;Fitness;Time since onset of injury/illness/exacerbation    Comorbidities   pacemaker, difficulty with healing of L elbow wound, peripheral vascular disease, hx of back surgery    Examination-Activity Limitations  Carry;Lift;Locomotion Level    Stability/Clinical Decision Making  Evolving/Moderate complexity    Rehab Potential  Fair    Clinical Impairments Affecting Rehab Potential  (-) chronicity of condition, age, comorbidities; (+) motivated    PT Frequency  2x / week    PT Duration  8 weeks    PT Treatment/Interventions  Aquatic Therapy;Gait training;Therapeutic activities;Therapeutic exercise;Balance training;Neuromuscular re-education;Patient/family education;Manual techniques;Dry needling    PT Next Visit Plan  trunk, hip,  scapular strengthening, balance, gait, manual techniques PRN    PT Home Exercise Plan  Sit<>stand, standing hip ext and abd, bridging, and heel raises    Consulted and Agree with Plan of Care  Patient       Patient will benefit from skilled therapeutic intervention in order to improve the following deficits and impairments:  Abnormal gait, Decreased activity tolerance, Decreased balance, Decreased endurance, Decreased range of motion, Decreased strength, Difficulty walking, Improper body mechanics, Postural dysfunction, Pain,  Decreased knowledge of use of DME  Visit Diagnosis: 1. Difficulty in walking, not elsewhere classified   2. Muscle weakness (generalized)   3. History of falling   4. Unsteadiness on feet        Problem List Patient Active Problem List   Diagnosis Date Noted  . Septic olecranon bursitis of left elbow 03/27/2018  . Lymphedema 10/11/2017  . Chronic venous insufficiency 10/11/2017  . Leg pain 10/11/2017  . Leg swelling 10/11/2017  . COPD (chronic obstructive pulmonary disease) (HCC) 10/11/2017  . Essential hypertension 10/11/2017    Loralyn FreshwaterMiguel Airam Heidecker PT, DPT   06/04/2019, 3:00 PM  [place] [facility] [address] [place], [place], [address] Phone: [phone]   Fax:  [phone]  Name: Katherine BassetJanice V Jeng MRN: [id number] Date of Birth: 06/20/1930

## 2019-06-06 ENCOUNTER — Ambulatory Visit: Payer: Medicare Other

## 2019-06-06 ENCOUNTER — Other Ambulatory Visit: Payer: Self-pay

## 2019-06-06 DIAGNOSIS — R2681 Unsteadiness on feet: Secondary | ICD-10-CM

## 2019-06-06 DIAGNOSIS — R262 Difficulty in walking, not elsewhere classified: Secondary | ICD-10-CM

## 2019-06-06 DIAGNOSIS — M6281 Muscle weakness (generalized): Secondary | ICD-10-CM

## 2019-06-06 DIAGNOSIS — Z9181 History of falling: Secondary | ICD-10-CM

## 2019-06-06 NOTE — Therapy (Signed)
[place] [facility] [address] [place], [place], [address] Phone: [phone]   Fax:  [phone]  Physical Therapy Treatment  Patient Details  Name: [name] MRN: [id number] Date of Birth: 10/14/1930 Referring Provider (PT): Cristopher PeruHemang Shah, MD   Encounter Date: 06/06/2019  PT End of Session - 06/06/19 1403    Visit Number  47    Number of Visits  78    Date for PT Re-Evaluation  06/13/19    Authorization Type  7    Authorization Time Period  of 10 progress report    PT Start Time  1403    PT Stop Time  1450    PT Time Calculation (min)  47 min    Equipment Utilized During Treatment  Gait belt   RW   Activity Tolerance  Patient tolerated treatment well;Patient limited by fatigue    Behavior During Therapy  [facility] tasks assessed/performed       Past Medical History:  Diagnosis Date  . Anemia   . GERD (gastroesophageal reflux disease)   . Hypertension   . Peripheral vascular disease (HCC)    possible neuropathies in lower extremeties  . PONV (postoperative nausea and vomiting)    happens sometimes but better with pre med of zofran  . Presence of permanent cardiac pacemaker   . Syncope     Past Surgical History:  Procedure Laterality Date  . ABDOMINAL HYSTERECTOMY  1969  . APPENDECTOMY  1969   with hysterectomy  . BACK SURGERY  2001   rods in back. surgery on back x 3  . COLONOSCOPY    . I&D EXTREMITY Left 03/27/2018   Procedure: IRRIGATION AND DEBRIDEMENT LEFT ELBOW / OLECRANON BURSA;  Surgeon: Christena FlakePoggi, John J, MD;  Location: [facility];  Service: Orthopedics;  Laterality: Left;  . I&D EXTREMITY Left 06/05/2018   Procedure: IRRIGATION AND DEBRIDEMENT EXTREMITY;  Surgeon: Christena FlakePoggi, John J, MD;  Location: [facility];  Service: Orthopedics;  Laterality: Left;  . INSERT / REPLACE / REMOVE PACEMAKER  2004  . JOINT REPLACEMENT Left 2004   partial knee replacement  . OLECRANON BURSECTOMY Left 03/27/2018   Procedure:  LEFT OLECRANON BURSA;  Surgeon: Christena FlakePoggi, John J, MD;  Location: [facility] OR[facility];  Service: Orthopedics;  Laterality: Left;  . PACEMAKER INSERTION  2014   dual chamber for complete heart block    There were no vitals filed for this visit.  Subjective Assessment - 06/06/19 1405    Subjective  Got a shot for her osteoporosis yesterday at 1 pm. Feels off balance currently after it. Both hips ached this morning. Fine now. Currently feels jittery and nervous. Knees feel wobbly.    Pertinent History  Imparied gait. Pt states that she has had balance issues for too long. Feels more comfortable walking with her rw. Had tests which showed that she has neuropathy. [facility] her [facility] is coming from her back. Just wants to be strong so she can walk.  Started walking with her rw off and on for the past several months.  Also feels like she might need a new sleep number bed. Also had 2 wound surgeries at [name]. Pt fell in February 2019. Had to stop PT due to the L elbow surgery April 06, 2018 to remove a bursa. The wound still has not healed.   Currently sees a wound doctor.   Feels numbness L foot from the ankle down.  No R foot numbness.  Sometimes unsure of where her L LE is in space.  Pt also states that she tires too quickly.  Has not been able to walk her dog in 2 years. Has not really been doing exercises at home except the sit <> stand. Pt states that prolonged sitting without back support will increase back pain     Limitations  Standing;Walking    How long can you sit comfortably?  1 hr    How long can you stand comfortably?  10 minutes    How long can you walk comfortably?  2 or 3 blocks    Diagnostic tests  EMG testing BLEs and BUEs which were negative    Patient Stated Goals  Improve LE strength. Wants to use the machines at the gym. Walk more steadily. Be able to take her dog (16 lbs) out on a leash.     Currently in Pain?  No/denies    Pain Score  0-No pain    Pain  Onset  More than a month ago         [facility] PT Assessment - 06/06/19 1438      Observation/Other Assessments   Observations  10 meter walk (0.53 m/s)                           PT Education - 06/06/19 1418    Education provided  Yes    Education Details  ther-ex    [employer]) Educated  Patient    Methods  Explanation;Demonstration;Tactile cues;Verbal cues    Comprehension  Returned demonstration;Verbalized understanding      OBJECTIVE  Pt ambulating with rw   L elbow wound  No latex band allergies    [employer] Code: [email or web address]   Pt has pacemaker   Therapeutic exercise:  Pt wearing [name] brace   seated trunk extension isometrics at neutral, manually resisted 10x10 seconds (upgrade) for 3 sets   Gait around gym,with SPC and CGA to SBA 100 ft.    Then 150 ft CGA with SBA   Cues for hip flexion to promote foot clearance.   Forward step ups onto 6 inch step with B UE assist   R 5x2  L 5x2  Standing hip abduction with B UE assist4lbsankle weight to promote glute med muscle strength R10x3 L10x3  gait x 10 m with SPC, cues for increased speed 2x   20 seconds, then 18 seconds (0.53 m/s)   Better time compared to 04/17/2019 measurement    Standing march with one UE assist 10x3 each LE to promote hip flexion during gait     Stepping over shoe box with SPC, CGA to mod A.  R4x L4x               LOB a few times. Min A to mod A to recover. Circumduction compensation     Improved exercise technique, movement at target joints, use of target muscles after min to mod verbal, visual, tactile cues.    Patient response to treatment: Good muscle use felt with exercises. No complain of increased pain throughout session.   Clinical impression Continued working on trunk and LE strengthening to promote ability to support herself with her LE as  well as with gait with SPC to promote balance and mobility. Pt demonstrates faster 10 meter walk time today compared to 04/17/19 measurement. Demonstrates difficulty stepping over a shoe box obstacle using SPC with LOB a few  times and decreased foot clearance and increased circumduction compensation in which fear of falling may play a factor. Able to clear both feet stepping over shoe box during previous sessions with PT hand held assist. Pt seems to have more confidence holding PT hand for assist. Pt will benefit from continued skilled physical therapy services to improve LE strength, balance, function, and decrease difficulty with gait.     PT Short Term Goals - 04/25/19 1723      PT SHORT TERM GOAL #1   Title  Patient will be independent with her HEP to improve strength and balance.     Time  3    Period  Weeks    Status  On-going    Target Date  05/08/19        PT Long Term Goals - 04/25/19 1455      PT LONG TERM GOAL #1   Title  Patient will improve her 10 MWT to 0.8 m/s or more with rw to promote better community ambulation.     Baseline  0.5 seconds with rw (09/06/2018); 0.63 seconds average with rw (10/18/2018); 0.69 m/s average using rw (12/10/2018); 0.59 m/s using SPC and CGA (01/08/2019); 0.60 m/s using SPC (01/21/2019); 0.56 m/s with SPC to no AD assist, CGA (02/04/2019); 0.45 m/s average (04/17/2019)    Time  8    Period  Weeks    Status  On-going    Target Date  06/13/19      PT LONG TERM GOAL #2   Title  Patient will improve her DGI score using rw or least restrictive AD to 19/24 or more to promote balance.     Baseline  13/24 using rw (09/06/2018); 14/24 using rw (10/29/2018); 16/24 (12/10/2018); 14/24 using SPC (01/21/2019); 9/24 using SPC (04/17/2019)     Time  8    Period  Weeks    Status  On-going    Target Date  06/13/19      PT LONG TERM GOAL #3   Title  Patient will improve bilateral LE strength by at least 1/2 MMT to promote ability to support herself when performing  standing tasks and improve balance.     Time  8    Period  Weeks    Status  On-going    Target Date  06/13/19      PT LONG TERM GOAL #4   Title  Patient will have a decrease in back pain/ache to 5/10 or less to promote ability to perform standing tasks with less difficulty.    Baseline  8/10 at most for the past 3 months (09/06/2018); 5-6/10 at worst for the past 7 days (10/18/2018); 5/10 at worst for the past 7 days (12/10/2018)    Time  6    Period  Weeks    Status  Achieved    Target Date  01/24/19            Plan - 06/06/19 1417    Clinical Impression Statement  Continued working on trunk and LE strengthening to promote ability to support herself with her LE as well as with gait with SPC to promote balance and mobility. Pt demonstrates faster 10 meter walk time today compared to 04/17/19 measurement. Demonstrates difficulty stepping over a shoe box obstacle using SPC with LOB a few times and decreased foot clearance and increased circumduction compensation in which fear of falling may play a factor. Able to clear both feet stepping over shoe box during previous sessions with PT hand held  assist. Pt seems to have more confidence holding PT hand for assist. Pt will benefit from continued skilled physical therapy services to improve LE strength, balance, function, and decrease difficulty with gait.    Personal Factors and Comorbidities  Age;Comorbidity 3+;Past/Current Experience;Fitness;Time since onset of injury/illness/exacerbation    Comorbidities   pacemaker, difficulty with healing of L elbow wound, peripheral vascular disease, hx of back surgery    Examination-Activity Limitations  Carry;Lift;Locomotion Level    Stability/Clinical Decision Making  Evolving/Moderate complexity    Rehab Potential  Fair    Clinical Impairments Affecting Rehab Potential  (-) chronicity of condition, age, comorbidities; (+) motivated    PT Frequency  2x / week    PT Duration  8 weeks    PT  Treatment/Interventions  Aquatic Therapy;Gait training;Therapeutic activities;Therapeutic exercise;Balance training;Neuromuscular re-education;Patient/family education;Manual techniques;Dry needling    PT Next Visit Plan  trunk, hip, scapular strengthening, balance, gait, manual techniques PRN    PT Home Exercise Plan  Sit<>stand, standing hip ext and abd, bridging, and heel raises    Consulted and Agree with Plan of Care  Patient       Patient will benefit from skilled therapeutic intervention in order to improve the following deficits and impairments:  Abnormal gait, Decreased activity tolerance, Decreased balance, Decreased endurance, Decreased range of motion, Decreased strength, Difficulty walking, Improper body mechanics, Postural dysfunction, Pain, Decreased knowledge of use of DME  Visit Diagnosis: 1. Difficulty in walking, not elsewhere classified   2. Muscle weakness (generalized)   3. History of falling   4. Unsteadiness on feet        Problem List Patient Active Problem List   Diagnosis Date Noted  . Septic olecranon bursitis of left elbow 03/27/2018  . Lymphedema 10/11/2017  . Chronic venous insufficiency 10/11/2017  . Leg pain 10/11/2017  . Leg swelling 10/11/2017  . COPD (chronic obstructive pulmonary disease) (HCC) 10/11/2017  . Essential hypertension 10/11/2017     Loralyn Freshwater PT, DPT   06/06/2019, 6:26 PM  [facility] PHYSICAL AND SPORTS MEDICINE [address], [place], [address] Phone: [phone]   Fax:  [phone]  Name: [name] MRN: [id number] Date of Birth: [birth year]

## 2019-06-11 ENCOUNTER — Ambulatory Visit: Payer: Medicare Other

## 2019-06-11 ENCOUNTER — Other Ambulatory Visit: Payer: Self-pay

## 2019-06-11 DIAGNOSIS — R262 Difficulty in walking, not elsewhere classified: Secondary | ICD-10-CM

## 2019-06-11 DIAGNOSIS — R2681 Unsteadiness on feet: Secondary | ICD-10-CM

## 2019-06-11 DIAGNOSIS — Z9181 History of falling: Secondary | ICD-10-CM

## 2019-06-11 DIAGNOSIS — M545 Low back pain, unspecified: Secondary | ICD-10-CM

## 2019-06-11 DIAGNOSIS — G8929 Other chronic pain: Secondary | ICD-10-CM

## 2019-06-11 DIAGNOSIS — M6281 Muscle weakness (generalized): Secondary | ICD-10-CM

## 2019-06-11 NOTE — Therapy (Signed)
[facility] PHYSICAL AND SPOR[facility] MEDICINE [address] [place], [place], [address] Phone: [phone]   Fax:  [phone]  Physical Therapy Treatment  Patient Details  Name: [name] MRN: [id number] Date of Birth: 02/23/1930 Referring Provider (PT): Cristopher PeruHemang Shah, MD   Encounter Date: 06/11/2019  PT End of Session - 06/11/19 1507    Visit Number  48    Number of Visits  94    Date for PT Re-Evaluation  08/08/19    Authorization Type  8    Authorization Time Period  of 10 progress report    PT Start Time  1507    PT Stop Time  1553    PT Time Calculation (min)  46 min    Equipment Utilized During Treatment  Gait belt   RW   Activity Tolerance  Patient tolerated treatment well;Patient limited by fatigue    Behavior During Therapy  [facility] for tasks assessed/performed       Past Medical History:  Diagnosis Date  . Anemia   . GERD (gastroesophageal reflux disease)   . Hypertension   . Peripheral vascular disease (HCC)    possible neuropathies in lower extremeties  . PONV (postoperative nausea and vomiting)    happens sometimes but better with pre med of zofran  . Presence of permanent cardiac pacemaker   . Syncope     Past Surgical History:  Procedure Laterality Date  . ABDOMINAL HYSTERECTOMY  1969  . APPENDECTOMY  1969   with hysterectomy  . BACK SURGERY  2001   rods in back. surgery on back x 3  . COLONOSCOPY    . I&D EXTREMITY Left 03/27/2018   Procedure: IRRIGATION AND DEBRIDEMENT LEFT ELBOW / OLECRANON BURSA;  Surgeon: Christena FlakePoggi, John J, MD;  Location: [facility] OR[facility];  Service: Orthopedics;  Laterality: Left;  . I&D EXTREMITY Left 06/05/2018   Procedure: IRRIGATION AND DEBRIDEMENT EXTREMITY;  Surgeon: Christena FlakePoggi, John J, MD;  Location: [facility] OR[facility];  Service: Orthopedics;  Laterality: Left;  . INSERT / REPLACE / REMOVE PACEMAKER  2004  . JOINT REPLACEMENT Left 2004   partial knee replacement  . OLECRANON BURSECTOMY Left 03/27/2018   Procedure:  LEFT OLECRANON BURSA;  Surgeon: Christena FlakePoggi, John J, MD;  Location: [facility] ORS;  Service: Orthopedics;  Laterality: Left;  . PACEMAKER INSERTION  2014   dual chamber for complete heart block    There were no vitals filed for this visit.  Subjective Assessment - 06/11/19 1508    Subjective  Doing ok. L knee feels like an achy weak when she get up and walk or stand on it. Does not hurt. Just and achy weak feeling. Pt states that [name] is almost healed. Just has a little drainage on it. Has another appointment with her surgeon in a few weeks.    Pertinent History  Imparied gait. Pt states that she has had balance issues for too long. Feels more comfortable walking with her rw. Had tests which showed that she has neuropathy. However [facility] that her balance is coming from her back. Just wants to be strong so she can walk.  Started walking with her rw off and on for the past several months.  Also feels like she might need a new sleep number bed. Also had 2 wound surgeries at [name]. Pt fell in February 2019. Had to stop PT due to the L elbow surgery April 06, 2018 to remove a bursa. The  wound still has not healed.   Currently sees a wound doctor.   Feels numbness L foot from the ankle down.  No R foot numbness.  Sometimes unsure of where her L LE is in space.  Pt also states that she tires too quickly.  Has not been able to walk her dog in 2 years. Has not really been doing exercises at home except the sit <> stand. Pt states that prolonged sitting without back support will increase back pain     Limitations  Standing;Walking    How long can you sit comfortably?  1 hr    How long can you stand comfortably?  10 minutes    How long can you walk comfortably?  2 or 3 blocks    Diagnostic tests  EMG testing BLEs and BUEs which were negative    Patient Stated Goals  Improve LE strength. Wants to use the machines at the gym. Walk more steadily. Be able to take her dog (16 lbs) out on a  leash.     Currently in Pain?  No/denies    Pain Score  0-No pain    Pain Onset  More than a month ago         [facility] PT Assessment - 06/11/19 1546      Dynamic Gait Index   Level Surface  Mild Impairment    Change in Gait Speed  Mild Impairment    Gait with Horizontal Head Turns  Moderate Impairment    Gait with Vertical Head Turns  Moderate Impairment    Gait and Pivot Turn  Mild Impairment    Step Over Obstacle  Moderate Impairment    Step Around Obstacles  Normal    Steps  Mild Impairment    Total Score  14    DGI comment:  with SPC. < 19/24 suggests increased risk for falls   Performed with [facility]                          PT Education - 06/11/19 1523    Education provided  Yes    Education Details  ther-ex    [employer]) Educated  Patient    Methods  Explanation;Demonstration;Tactile cues;Verbal cues    Comprehension  Returned demonstration;Verbalized understanding        OBJECTIVE  Pt ambulating with rw   L elbow wound  No latex band allergies    MedbridgeAccess Code: [email or web address]   Pt has pacemaker  Manual therapy  Seated STM L lateral knee (IT Band, L vastus lateralis, lateral hamstrings.   Decreased L patellar knee weak ache feeling with gait afterwards.   Therapeutic exercise:   seated trunk extension isometrics at neutral, manually resisted 10x10secondsfor 3 sets   Directed patient with gait with normal gait speed, with changes in speed, 180 degree pivot turn, with R and L cervical rotation position, with cervical flexion and extension position, stepping around obstacles, stepping over an obstacle, ascending and descending 4 regular steps with UE assist   Reviewed progress/current status with PT towards goals.   Reviewed plan of care: continue another 2x/week for 8 weeks    Improved exercise technique, movement at target joints, use of target muscles after min to mod verbal, visual, tactile cues.     Patient response to treatment: Decreased L knee symptoms after manual therapy to decrease lateral knee and thigh tension. No complain of increased pain throughout session.   Clinical impression Pt demonstrates improved 10  meter walk speed and DGI score using SPC compared to previous measurement on 04/17/2019 after returning to the clinic from a hiatus due to COVID-19 related restrictions. Pt seems to be back at previous levels prior to the hiatus. Decreased L knee/patella ache and weak feeling after manual therapy to decrease L lateral hamstrings, vastus lateralis, and iliotibial band tension. Pt still demonstrates B LE weakness, decreased balance, fear of falling, difficulty performing standing tasks and difficulty ambulating with her SPC and would benefit from continued skilled physical therapy services to address the aforementioned deficits and help patient return to her prior level of function. Challenges to progress include chronicity of condition and fear of falling.      PT Short Term Goals - 04/25/19 1723      PT SHORT TERM GOAL #1   Title  Patient will be independent with her HEP to improve strength and balance.     Time  3    Period  Weeks    Status  On-going    Target Date  05/08/19        PT Long Term Goals - 06/11/19 1547      PT LONG TERM GOAL #1   Title  Patient will improve her 10 MWT to 0.8 m/s or more with rw to promote better community ambulation.     Baseline  0.5 seconds with rw (09/06/2018); 0.63 seconds average with rw (10/18/2018); 0.69 m/s average using rw (12/10/2018); 0.59 m/s using SPC and CGA (01/08/2019); 0.60 m/s using SPC (01/21/2019); 0.56 m/s with SPC to no AD assist, CGA (02/04/2019); 0.45 m/s average (04/17/2019); 0.53 m/s with SPC (06/06/2019)    Time  8    Period  Weeks    Status  On-going    Target Date  08/08/19      PT LONG TERM GOAL #2   Title  Patient will improve her DGI score using rw or least restrictive AD to 19/24 or more to promote  balance.     Baseline  13/24 using rw (09/06/2018); 14/24 using rw (10/29/2018); 16/24 (12/10/2018); 14/24 using SPC (01/21/2019); 9/24 using SPC (04/17/2019); 14/24 (06/11/2019)    Time  8    Period  Weeks    Status  On-going    Target Date  08/08/19      PT LONG TERM GOAL #3   Title  Patient will improve bilateral LE strength by at least 1/2 MMT to promote ability to support herself when performing standing tasks and improve balance.     Time  8    Period  Weeks    Status  On-going    Target Date  08/08/19      PT LONG TERM GOAL #4   Title  Patient will have a decrease in back pain/ache to 5/10 or less to promote ability to perform standing tasks with less difficulty.    Baseline  8/10 at most for the past 3 months (09/06/2018); 5-6/10 at worst for the past 7 days (10/18/2018); 5/10 at worst for the past 7 days (12/10/2018)    Time  6    Period  Weeks    Status  Achieved            Plan - 06/11/19 1526    Clinical Impression Statement  Pt demonstrates improved 10 meter walk speed and DGI score using SPC compared to previous measurement on 04/17/2019 after returning to the clinic from a hiatus due to COVID-19 related restrictions. Pt seems to be back at previous  levels prior to the hiatus. Decreased L knee/patella ache and weak feeling after manual therapy to decrease L lateral hamstrings, vastus lateralis, and iliotibial band tension. Pt still demonstrates B LE weakness, decreased balance, fear of falling, difficulty performing standing tasks and difficulty ambulating with her SPC and would benefit from continued skilled physical therapy services to address the aforementioned deficits and help patient return to her prior level of function. Challenges to progress include chronicity of condition and fear of falling.    Personal Factors and Comorbidities  Age;Comorbidity 3+;Past/Current Experience;Fitness;Time since onset of injury/illness/exacerbation    Comorbidities   pacemaker, difficulty  with healing of L elbow wound, peripheral vascular disease, hx of back surgery    Examination-Activity Limitations  Carry;Lift;Locomotion Level    Stability/Clinical Decision Making  Evolving/Moderate complexity    Clinical Decision Making  Low    Clinical Presentation due to:  improved DGI and 10 MWT since last measured    Rehab Potential  Fair    Clinical Impairments Affecting Rehab Potential  (-) chronicity of condition, age, comorbidities; (+) motivated    PT Frequency  2x / week    PT Duration  8 weeks    PT Treatment/Interventions  Aquatic Therapy;Gait training;Therapeutic activities;Therapeutic exercise;Balance training;Neuromuscular re-education;Patient/family education;Manual techniques;Dry needling    PT Next Visit Plan  trunk, hip, scapular strengthening, balance, gait, manual techniques PRN    PT Home Exercise Plan  Sit<>stand, standing hip ext and abd, bridging, and heel raises    Consulted and Agree with Plan of Care  Patient       Patient will benefit from skilled therapeutic intervention in order to improve the following deficits and impairments:  Abnormal gait, Decreased activity tolerance, Decreased balance, Decreased endurance, Decreased range of motion, Decreased strength, Difficulty walking, Improper body mechanics, Postural dysfunction, Pain, Decreased knowledge of use of DME  Visit Diagnosis: 1. Difficulty in walking, not elsewhere classified   2. Muscle weakness (generalized)   3. History of falling   4. Unsteadiness on feet   5. Chronic low back pain, unspecified back pain laterality, unspecified whether sciatica present        Problem List Patient Active Problem List   Diagnosis Date Noted  . Septic olecranon bursitis of left elbow 03/27/2018  . Lymphedema 10/11/2017  . Chronic venous insufficiency 10/11/2017  . Leg pain 10/11/2017  . Leg swelling 10/11/2017  . COPD (chronic obstructive pulmonary disease) ([place]) 10/11/2017  . Essential hypertension  10/11/2017    Joneen Boers PT, DPT   06/11/2019, 4:19 PM  [facility] PHYS[facility] AND SPORTS MEDICINE [address], [place], [address] Phone: [phone]   Fax:  [phone]  Name: [name] MRN: [id number] Date of Birth: 08-Nov-1930

## 2019-06-13 ENCOUNTER — Other Ambulatory Visit: Payer: Self-pay

## 2019-06-13 ENCOUNTER — Ambulatory Visit: Payer: Medicare Other | Attending: Neurology

## 2019-06-13 DIAGNOSIS — R2689 Other abnormalities of gait and mobility: Secondary | ICD-10-CM | POA: Insufficient documentation

## 2019-06-13 DIAGNOSIS — R262 Difficulty in walking, not elsewhere classified: Secondary | ICD-10-CM | POA: Diagnosis not present

## 2019-06-13 DIAGNOSIS — M545 Low back pain, unspecified: Secondary | ICD-10-CM

## 2019-06-13 DIAGNOSIS — S41102S Unspecified open wound of left upper arm, sequela: Secondary | ICD-10-CM | POA: Insufficient documentation

## 2019-06-13 DIAGNOSIS — M25512 Pain in left shoulder: Secondary | ICD-10-CM | POA: Insufficient documentation

## 2019-06-13 DIAGNOSIS — M79605 Pain in left leg: Secondary | ICD-10-CM | POA: Insufficient documentation

## 2019-06-13 DIAGNOSIS — M6281 Muscle weakness (generalized): Secondary | ICD-10-CM | POA: Diagnosis present

## 2019-06-13 DIAGNOSIS — M25511 Pain in right shoulder: Secondary | ICD-10-CM | POA: Insufficient documentation

## 2019-06-13 DIAGNOSIS — Z9181 History of falling: Secondary | ICD-10-CM | POA: Insufficient documentation

## 2019-06-13 DIAGNOSIS — M79604 Pain in right leg: Secondary | ICD-10-CM | POA: Diagnosis present

## 2019-06-13 DIAGNOSIS — G8929 Other chronic pain: Secondary | ICD-10-CM | POA: Diagnosis present

## 2019-06-13 DIAGNOSIS — M542 Cervicalgia: Secondary | ICD-10-CM | POA: Diagnosis present

## 2019-06-13 DIAGNOSIS — R2681 Unsteadiness on feet: Secondary | ICD-10-CM

## 2019-06-13 NOTE — Therapy (Signed)
[place] PHYSICAL AND SPORTS MEDICINE [address], [place], [address] Phone: [phone]   Fax:  [phone]  Physical Therapy Treatment  Patient Details  Name: [name] MRN: [id number] Date of Birth: 11-22-1930 Referring Provider (PT): Jennings Books, MD    Encounter Date: 06/13/2019  PT End of Session - 06/13/19 1413    Visit Number  28    Number of Visits  35    Date for PT Re-Evaluation  08/08/19    Authorization Type  9    Authorization Time Period  of 10 progress report    PT Start Time  1414    PT Stop Time  1454    PT Time Calculation (min)  40 min    Equipment Utilized During Treatment  Gait belt   RW   Activity Tolerance  Patient tolerated treatment well;Patient limited by fatigue    Behavior During Therapy  [facility] for tasks assessed/performed       Past Medical History:  Diagnosis Date  . Anemia   . GERD (gastroesophageal reflux disease)   . Hypertension   . Peripheral vascular disease (HCC)    possible neuropathies in lower extremeties  . PONV (postoperative nausea and vomiting)    happens sometimes but better with pre med of zofran  . Presence of permanent cardiac pacemaker   . Syncope     Past Surgical History:  Procedure Laterality Date  . ABDOMINAL HYSTERECTOMY  1969  . APPENDECTOMY  1969   with hysterectomy  . BACK SURGERY  2001   rods in back. surgery on back x 3  . COLONOSCOPY    . I&D EXTREMITY Left 03/27/2018   Procedure: IRRIGATION AND DEBRIDEMENT LEFT ELBOW / OLECRANON BURSA;  Surgeon: Corky Mull, MD;  Location: [facility] OR[facility];  Service: Orthopedics;  Laterality: Left;  . I&D EXTREMITY Left 06/05/2018   Procedure: IRRIGATION AND DEBRIDEMENT EXTREMITY;  Surgeon: Corky Mull, MD;  Location: [facility] ORS;  Service: Orthopedics;  Laterality: Left;  . INSERT / REPLACE / REMOVE PACEMAKER  2004  . JOINT REPLACEMENT Left 2004   partial knee replacement  . OLECRANON BURSECTOMY Left 03/27/2018   Procedure:  LEFT OLECRANON BURSA;  Surgeon: Corky Mull, MD;  Location: [facility] ORS;  Service: Orthopedics;  Laterality: Left;  . PACEMAKER INSERTION  2014   dual chamber for complete heart block    There were no vitals filed for this visit.  Subjective Assessment - 06/13/19 1415    Subjective  Feeling tired today. Had a doctor's appointment earlier today. Still feels the weak achy feeling L knee.    Pertinent History  Imparied gait. Pt states that she has had balance issues for too long. Feels more comfortable walking with her rw. Had tests which showed that she has neuropathy. However the doctor at [facility] said that her balance is coming from her back. Just wants to be strong so she can walk.  Started walking with her rw off and on for the past several months.  Also feels like she might need a new sleep number bed. Also had 2 wound surgeries at [name]. Pt fell in February 2019. Had to stop PT due to the L elbow surgery April 06, 2018 to remove a bursa. The wound still has not healed.   Currently sees a wound doctor.   Feels numbness L foot from the ankle down.  No R foot numbness.  Sometimes unsure of where her L LE  is in space.  Pt also states that she tires too quickly.  Has not been able to walk her dog in 2 years. Has not really been doing exercises at home except the sit <> stand. Pt states that prolonged sitting without back support will increase back pain     Limitations  Standing;Walking    How long can you sit comfortably?  1 hr    How long can you stand comfortably?  10 minutes    How long can you walk comfortably?  2 or 3 blocks    Diagnostic tests  EMG testing BLEs and BUEs which were negative    Patient Stated Goals  Improve LE strength. Wants to use the machines at the gym. Walk more steadily. Be able to take her dog (16 lbs) out on a leash.     Currently in Pain?  No/denies    Pain Onset  More than a month ago                               PT Education -  06/13/19 1418    Education provided  Yes    Education Details  ther-ex    [employer]) Educated  Patient    Methods  Explanation;Demonstration;Tactile cues;Verbal cues    Comprehension  Returned demonstration;Verbalized understanding       OBJECTIVE  Pt ambulating with rw   L elbow wound  No latex band allergies    MedbridgeAccess Code: [email or web address]   Pt has pacemaker  Manual therapy  Seated STM L lateral knee (IT Band, L vastus lateralis, lateral hamstrings.    Therapeutic exercise:   seated trunk extension isometrics at neutral, manually resisted 10x10secondsfor 3 sets   Seated hip adduction ball and glute max squeeze 10x5 seconds for 2 sets   Standing hip abduction with B UE assist4lbsankle weight to promote glute med muscle strength R10x3 L10x3   forward step up onto 4 inch step with one UE assist   R 10x2  L 10x2  SLS with occasional light touch assist   R 5x5 seconds  L 5x5 seconds    Able to maintain 5 second SLS L LE about 3 times.   Standing march with one UE assist 10x2 each LE to promote hip flexion during gait    Improved exercise technique, movement at target joints, use of target muscles aftermin tomod verbal, visual, tactile cues.    Patient response to treatment: No L knee symptoms after manual therapy and during exercises     Clinical impression  Improving balance with pt able to maintain single leg stand on L LE for about 5 seconds 3 times without UE or contralateral LE assist. Continued working on LE strength to promote ability to support herself with her LE with less need for AD. No L knee symptoms after manual therapy and during exercises. Pt will benefit from continued skilled physical therapy services to improve LE strength, balance, ability to ambulate, and decrease fall risk.      PT Short Term Goals - 04/25/19 1723      PT SHORT TERM GOAL #1   Title  Patient will  be independent with her HEP to improve strength and balance.     Time  3    Period  Weeks    Status  On-going    Target Date  05/08/19        PT Long Term Goals - 06/11/19 1547  PT LONG TERM GOAL #1   Title  Patient will improve her 10 MWT to 0.8 m/s or more with rw to promote better community ambulation.     Baseline  0.5 seconds with rw (09/06/2018); 0.63 seconds average with rw (10/18/2018); 0.69 m/s average using rw (12/10/2018); 0.59 m/s using SPC and CGA (01/08/2019); 0.60 m/s using SPC (01/21/2019); 0.56 m/s with SPC to no AD assist, CGA (02/04/2019); 0.45 m/s average (04/17/2019); 0.53 m/s with SPC (06/06/2019)    Time  8    Period  Weeks    Status  On-going    Target Date  08/08/19      PT LONG TERM GOAL #2   Title  Patient will improve her DGI score using rw or least restrictive AD to 19/24 or more to promote balance.     Baseline  13/24 using rw (09/06/2018); 14/24 using rw (10/29/2018); 16/24 (12/10/2018); 14/24 using SPC (01/21/2019); 9/24 using SPC (04/17/2019); 14/24 (06/11/2019)    Time  8    Period  Weeks    Status  On-going    Target Date  08/08/19      PT LONG TERM GOAL #3   Title  Patient will improve bilateral LE strength by at least 1/2 MMT to promote ability to support herself when performing standing tasks and improve balance.     Time  8    Period  Weeks    Status  On-going    Target Date  08/08/19      PT LONG TERM GOAL #4   Title  Patient will have a decrease in back pain/ache to 5/10 or less to promote ability to perform standing tasks with less difficulty.    Baseline  8/10 at most for the past 3 months (09/06/2018); 5-6/10 at worst for the past 7 days (10/18/2018); 5/10 at worst for the past 7 days (12/10/2018)    Time  6    Period  Weeks    Status  Achieved            Plan - 06/13/19 1412    Clinical Impression Statement  Improving balance with pt able to maintain single leg stand on L LE for about 5 seconds 3 times without UE or contralateral LE  assist. Continued working on LE strength to promote ability to support herself with her LE with less need for AD. No L knee symptoms after manual therapy and during exercises. Pt will benefit from continued skilled physical therapy services to improve LE strength, balance, ability to ambulate, and decrease fall risk.    Personal Factors and Comorbidities  Age;Comorbidity 3+;Past/Current Experience;Fitness;Time since onset of injury/illness/exacerbation    Comorbidities   pacemaker, difficulty with healing of L elbow wound, peripheral vascular disease, hx of back surgery    Examination-Activity Limitations  Carry;Lift;Locomotion Level    Stability/Clinical Decision Making  Evolving/Moderate complexity    Rehab Potential  Fair    Clinical Impairments Affecting Rehab Potential  (-) chronicity of condition, age, comorbidities; (+) motivated    PT Frequency  2x / week    PT Duration  8 weeks    PT Treatment/Interventions  Aquatic Therapy;Gait training;Therapeutic activities;Therapeutic exercise;Balance training;Neuromuscular re-education;Patient/family education;Manual techniques;Dry needling    PT Next Visit Plan  trunk, hip, scapular strengthening, balance, gait, manual techniques PRN    PT Home Exercise Plan  Sit<>stand, standing hip ext and abd, bridging, and heel raises    Consulted and Agree with Plan of Care  Patient  Patient will benefit from skilled therapeutic intervention in order to improve the following deficits and impairments:  Abnormal gait, Decreased activity tolerance, Decreased balance, Decreased endurance, Decreased range of motion, Decreased strength, Difficulty walking, Improper body mechanics, Postural dysfunction, Pain, Decreased knowledge of use of DME  Visit Diagnosis: 1. Difficulty in walking, not elsewhere classified   2. Muscle weakness (generalized)   3. History of falling   4. Unsteadiness on feet   5. Chronic low back pain, unspecified back pain laterality,  unspecified whether sciatica present        Problem List Patient Active Problem List   Diagnosis Date Noted  . Septic olecranon bursitis of left elbow 03/27/2018  . Lymphedema 10/11/2017  . Chronic venous insufficiency 10/11/2017  . Leg pain 10/11/2017  . Leg swelling 10/11/2017  . COPD (chronic obstructive pulmonary disease) (HCC) 10/11/2017  . Essential hypertension 10/11/2017     Loralyn Freshwater PT, DPT   06/13/2019, 6:23 PM  [facility] PH[facility] AND SPORTS MEDICINE [address], [place], [address] Phone: [phone]   Fax:  [phone]  Name: [name] MRN: [id number] Date of Birth: Jun 02, 1930

## 2019-06-18 ENCOUNTER — Ambulatory Visit: Payer: Medicare Other

## 2019-06-18 ENCOUNTER — Other Ambulatory Visit: Payer: Self-pay

## 2019-06-18 DIAGNOSIS — R2681 Unsteadiness on feet: Secondary | ICD-10-CM

## 2019-06-18 DIAGNOSIS — R262 Difficulty in walking, not elsewhere classified: Secondary | ICD-10-CM | POA: Diagnosis not present

## 2019-06-18 DIAGNOSIS — Z9181 History of falling: Secondary | ICD-10-CM

## 2019-06-18 DIAGNOSIS — M6281 Muscle weakness (generalized): Secondary | ICD-10-CM

## 2019-06-18 DIAGNOSIS — G8929 Other chronic pain: Secondary | ICD-10-CM

## 2019-06-18 NOTE — Therapy (Signed)
[facility] [address] [place], [address] Phone: [phone]   Fax:  [phone]  Physical Therapy Treatment And Progress Report  (04/30/2019 - 06/18/2019)  Patient Details  Name: COHEN DOLEMAN MRN: [id number] Date of Birth: [birth year] Referring Provider (PT): Jennings Books, MD   Encounter Date: 06/18/2019  PT End of Session - 06/18/19 1405    Visit Number  50    Number of Visits  94    Date for PT Re-Evaluation  08/08/19    Authorization Type  10    Authorization Time Period  of 10 progress report    PT Start Time  1405    PT Stop Time  1454    PT Time Calculation (min)  49 min    Equipment Utilized During Treatment  Gait belt   RW   Activity Tolerance  Patient tolerated treatment well;Patient limited by fatigue    Behavior During Therapy  [facility] for tasks assessed/performed       Past Medical History:  Diagnosis Date  . Anemia   . GERD (gastroesophageal reflux disease)   . Hypertension   . Peripheral vascular disease (HCC)    possible neuropathies in lower extremeties  . PONV (postoperative nausea and vomiting)    happens sometimes but better with pre med of zofran  . Presence of permanent cardiac pacemaker   . Syncope     Past Surgical History:  Procedure Laterality Date  . ABDOMINAL HYSTERECTOMY  1969  . APPENDECTOMY  1969   with hysterectomy  . BACK SURGERY  2001   rods in back. surgery on back x 3  . COLONOSCOPY    . I&D EXTREMITY Left 03/27/2018   Procedure: IRRIGATION AND DEBRIDEMENT LEFT ELBOW / OLECRANON BURSA;  Surgeon: Corky Mull, MD;  Location: [facility] ORS;  Service: Orthopedics;  Laterality: Left;  . I&D EXTREMITY Left 06/05/2018   Procedure: IRRIGATION AND DEBRIDEMENT EXTREMITY;  Surgeon: Corky Mull, MD;  Location: [facility] ORS;  Service: Orthopedics;  Laterality: Left;  . INSERT / REPLACE / REMOVE PACEMAKER  2004  . JOINT REPLACEMENT Left 2004   partial knee replacement  .  OLECRANON BURSECTOMY Left 03/27/2018   Procedure: LEFT OLECRANON BURSA;  Surgeon: Corky Mull, MD;  Location: [facility] ORS;  Service: Orthopedics;  Laterality: Left;  . PACEMAKER INSERTION  2014   dual chamber for complete heart block    There were no vitals filed for this visit.  Subjective Assessment - 06/18/19 1408    Subjective  Pt states that she had a chaotic morning. Too much stuff going on and unhappy people. No pain or discomfort. Not having consistent L knee achy weak feeling in her knee. Massaging the lateral thigh helps it. Pt's housekeeper also quit because she did not want to wear a mask.    Pertinent History  Imparied gait. Pt states that she has had balance issues for too long. Feels more comfortable walking with her rw. Had tests which showed that she has neuropathy. However the doctor at [facility] said that her balance is coming from her back. Just wants to be strong so she can walk.  Started walking with her rw off and on for the past several months.  Also feels like she might need a new sleep number bed. Also had 2 wound surgeries at [name]. Pt fell in February 2019. Had to stop PT due to the L elbow surgery April 06, 2018 to  remove a bursa. The wound still has not healed.   Currently sees a wound doctor.   Feels numbness L foot from the ankle down.  No R foot numbness.  Sometimes unsure of where her L LE is in space.  Pt also states that she tires too quickly.  Has not been able to walk her dog in 2 years. Has not really been doing exercises at home except the sit <> stand. Pt states that prolonged sitting without back support will increase back pain     Limitations  Standing;Walking    How long can you sit comfortably?  1 hr    How long can you stand comfortably?  10 minutes    How long can you walk comfortably?  2 or 3 blocks    Diagnostic tests  EMG testing BLEs and BUEs which were negative    Patient Stated Goals  Improve LE strength. Wants to use the machines at the gym.  Walk more steadily. Be able to take her dog (16 lbs) out on a leash.     Currently in Pain?  No/denies    Pain Score  0-No pain    Pain Onset  More than a month ago         [facility] PT Assessment - 06/18/19 1410      Strength   Right Hip Flexion  4/5    Right Hip Extension  4/5   seated manually resisted   Right Hip ABduction  4-/5   seated manually resisted clamshell   Left Hip Flexion  5/5    Left Hip Extension  4-/5   seated manually resisted   Left Hip ABduction  4/5   seated manually resisted clamshell   Right Knee Flexion  4-/5   R low back tension   Right Knee Extension  4+/5   R low back tension   Left Knee Flexion  4-/5    Left Knee Extension  5/5                           PT Education - 06/18/19 1446    Education provided  Yes    Education Details  ther-ex    Person(s) Educated  Patient    Methods  Explanation;Demonstration;Tactile cues;Verbal cues    Comprehension  Returned demonstration;Verbalized understanding      OBJECTIVE  Pt ambulating with rw     No latex band allergies    MedbridgeAccess Code: [email or web address]   Pt has pacemaker    Therapeutic exercise:   Seated manually resisted hip flexion, hip extension, clamshell isometric, knee extension, knee flexion 1-2x each way for each LE  Reviewed progress/current status with LE strength with pt.   Reviewed progress/current status with PT towards goals   seated trunk extension isometrics at neutral, manually resisted 10x10secondsfor 2 sets  Gait with SPC CGA to SBA 170 ft  Then 120 ft. LOB x 1 during first 20 ft with min A to recover  Side stepping with SPC 32 ft to the R and 32 ft to the L CGA to promote glute med muscle strength. Low back discomfort which eases off after sitting rest break  Standing march using SPC on R 10x3 each LE to promote foot clearance during gait with SPC     SLS with occasional light touch assist              R 5x5  seconds  L 5x5 seconds               Improved exercise technique, movement at target joints, use of target muscles after mod verbal, visual, tactile cues.    Patient response to treatment: Low back discomfort after side stepping exercises which eased with sitting rest break. No complain of L knee symptoms throughout session.      Clinical impression Pt demonstrates improved DGI and 10 MWT scores compared to when last measured. Similar strength compared to May 2020 progress report. Continued working on gait with least restrictive AD, glute strengthening, and balance to promote ability to perform standing tasks as well as decrease difficulty ambulating. Pt will benefit from continued skilled physical therapy services to improve strength, function, and gait.       PT Short Term Goals - 04/25/19 1723      PT SHORT TERM GOAL #1   Title  Patient will be independent with her HEP to improve strength and balance.     Time  3    Period  Weeks    Status  On-going    Target Date  05/08/19        PT Long Term Goals - 06/11/19 1547      PT LONG TERM GOAL #1   Title  Patient will improve her 10 MWT to 0.8 m/s or more with rw to promote better community ambulation.     Baseline  0.5 seconds with rw (09/06/2018); 0.63 seconds average with rw (10/18/2018); 0.69 m/s average using rw (12/10/2018); 0.59 m/s using SPC and CGA (01/08/2019); 0.60 m/s using SPC (01/21/2019); 0.56 m/s with SPC to no AD assist, CGA (02/04/2019); 0.45 m/s average (04/17/2019); 0.53 m/s with SPC (06/06/2019)    Time  8    Period  Weeks    Status  On-going    Target Date  08/08/19      PT LONG TERM GOAL #2   Title  Patient will improve her DGI score using rw or least restrictive AD to 19/24 or more to promote balance.     Baseline  13/24 using rw (09/06/2018); 14/24 using rw (10/29/2018); 16/24 (12/10/2018); 14/24 using SPC (01/21/2019); 9/24 using SPC (04/17/2019); 14/24 (06/11/2019)    Time  8    Period  Weeks     Status  On-going    Target Date  08/08/19      PT LONG TERM GOAL #3   Title  Patient will improve bilateral LE strength by at least 1/2 MMT to promote ability to support herself when performing standing tasks and improve balance.     Time  8    Period  Weeks    Status  On-going    Target Date  08/08/19      PT LONG TERM GOAL #4   Title  Patient will have a decrease in back pain/ache to 5/10 or less to promote ability to perform standing tasks with less difficulty.    Baseline  8/10 at most for the past 3 months (09/06/2018); 5-6/10 at worst for the past 7 days (10/18/2018); 5/10 at worst for the past 7 days (12/10/2018)    Time  6    Period  Weeks    Status  Achieved            Plan - 06/18/19 1611    Clinical Impression Statement  Pt demonstrates improved DGI and 10 MWT scores compared to when last measured. Similar strength compared to May 2020 progress report. Continued working  on gait with least restrictive AD, glute strengthening, and balance to promote ability to perform standing tasks as well as decrease difficulty ambulating. Pt will benefit from continued skilled physical therapy services to improve strength, function, and gait.    Personal Factors and Comorbidities  Age;Comorbidity 3+;Past/Current Experience;Fitness;Time since onset of injury/illness/exacerbation    Comorbidities   pacemaker, difficulty with healing of L elbow wound, peripheral vascular disease, hx of back surgery    Examination-Activity Limitations  Carry;Lift;Locomotion Level    Stability/Clinical Decision Making  Evolving/Moderate complexity    Rehab Potential  Fair    Clinical Impairments Affecting Rehab Potential  (-) chronicity of condition, age, comorbidities; (+) motivated    PT Frequency  2x / week    PT Duration  8 weeks    PT Treatment/Interventions  Aquatic Therapy;Gait training;Therapeutic activities;Therapeutic exercise;Balance training;Neuromuscular re-education;Patient/family  education;Manual techniques;Dry needling    PT Next Visit Plan  trunk, hip, scapular strengthening, balance, gait, manual techniques PRN    PT Home Exercise Plan  Sit<>stand, standing hip ext and abd, bridging, and heel raises    Consulted and Agree with Plan of Care  Patient       Patient will benefit from skilled therapeutic intervention in order to improve the following deficits and impairments:  Abnormal gait, Decreased activity tolerance, Decreased balance, Decreased endurance, Decreased range of motion, Decreased strength, Difficulty walking, Improper body mechanics, Postural dysfunction, Pain, Decreased knowledge of use of DME  Visit Diagnosis: 1. Difficulty in walking, not elsewhere classified   2. Muscle weakness (generalized)   3. History of falling   4. Unsteadiness on feet   5. Chronic low back pain, unspecified back pain laterality, unspecified whether sciatica present        Problem List Patient Active Problem List   Diagnosis Date Noted  . Septic olecranon bursitis of left elbow 03/27/2018  . Lymphedema 10/11/2017  . Chronic venous insufficiency 10/11/2017  . Leg pain 10/11/2017  . Leg swelling 10/11/2017  . COPD (chronic obstructive pulmonary disease) (HCC) 10/11/2017  . Essential hypertension 10/11/2017    Loralyn FreshwaterMiguel Laygo PT, DPT   06/18/2019, 4:18 PM  [facility] [address] [place], [place], [address] Phone: [phone]   Fax:  [phone]  Name: Katherine BassetJanice V Fontan MRN: [id number] Date of Birth: 05/04/1930

## 2019-06-20 ENCOUNTER — Other Ambulatory Visit: Payer: Self-pay

## 2019-06-20 ENCOUNTER — Ambulatory Visit: Payer: Medicare Other

## 2019-06-20 DIAGNOSIS — R262 Difficulty in walking, not elsewhere classified: Secondary | ICD-10-CM | POA: Diagnosis not present

## 2019-06-20 DIAGNOSIS — R2681 Unsteadiness on feet: Secondary | ICD-10-CM

## 2019-06-20 DIAGNOSIS — M545 Low back pain, unspecified: Secondary | ICD-10-CM

## 2019-06-20 DIAGNOSIS — M6281 Muscle weakness (generalized): Secondary | ICD-10-CM

## 2019-06-20 DIAGNOSIS — Z9181 History of falling: Secondary | ICD-10-CM

## 2019-06-20 DIAGNOSIS — G8929 Other chronic pain: Secondary | ICD-10-CM

## 2019-06-20 NOTE — Therapy (Signed)
[facility] [address], [place], [address] Phone: [phone]   Fax:  [phone]  Physical Therapy Treatment  Patient Details  Name: [name] MRN: [id number] Date of Birth: 19-Sep-1930 Referring Provider (PT): Jennings Books, MD   Encounter Date: 06/20/2019  PT End of Session - 06/20/19 1504    Visit Number  51    Number of Visits  94    Date for PT Re-Evaluation  08/08/19    Authorization Type  1    Authorization Time Period  of 10 progress report    PT Start Time  1421    PT Stop Time  1505    PT Time Calculation (min)  44 min    Equipment Utilized During Treatment  Gait belt   RW   Activity Tolerance  Patient tolerated treatment well;Patient limited by fatigue    Behavior During Therapy  [facility] for tasks assessed/performed       Past Medical History:  Diagnosis Date  . Anemia   . GERD (gastroesophageal reflux disease)   . Hypertension   . Peripheral vascular disease (HCC)    possible neuropathies in lower extremeties  . PONV (postoperative nausea and vomiting)    happens sometimes but better with pre med of zofran  . Presence of permanent cardiac pacemaker   . Syncope     Past Surgical History:  Procedure Laterality Date  . ABDOMINAL HYSTERECTOMY  1969  . APPENDECTOMY  1969   with hysterectomy  . BACK SURGERY  2001   rods in back. surgery on back x 3  . COLONOSCOPY    . I&D EXTREMITY Left 03/27/2018   Procedure: IRRIGATION AND DEBRIDEMENT LEFT ELBOW / OLECRANON BURSA;  Surgeon: Corky Mull, MD;  Location: [facility] OR[facility];  Service: Orthopedics;  Laterality: Left;  . I&D EXTREMITY Left 06/05/2018   Procedure: IRRIGATION AND DEBRIDEMENT EXTREMITY;  Surgeon: Corky Mull, MD;  Location: [facility] OR[facility];  Service: Orthopedics;  Laterality: Left;  . INSERT / REPLACE / REMOVE PACEMAKER  2004  . JOINT REPLACEMENT Left 2004   partial knee replacement  . OLECRANON BURSECTOMY Left 03/27/2018   Procedure:  LEFT OLECRANON BURSA;  Surgeon: Corky Mull, MD;  Location: [facility] ORS;  Service: Orthopedics;  Laterality: Left;  . PACEMAKER INSERTION  2014   dual chamber for complete heart block    There were no vitals filed for this visit.  Subjective Assessment - 06/20/19 1421    Subjective  Pt states doing ok. No pain. L elbow is tender when pressed.    Pertinent History  Imparied gait. Pt states that she has had balance issues for too long. Feels more comfortable walking with her rw. Had tests which showed that she has neuropathy. However the doctor at [facility] said that her balance is coming from her back. Just wants to be strong so she can walk.  Started walking with her rw off and on for the past several months.  Also feels like she might need a new sleep number bed. Also had 2 wound surgeries at [name]. Pt fell in February 2019. Had to stop PT due to the L elbow surgery April 06, 2018 to remove a bursa. The wound still has not healed.   Currently sees a wound doctor.   Feels numbness L foot from the ankle down.  No R foot numbness.  Sometimes unsure of where her L LE is in space.  Pt also  states that she tires too quickly.  Has not been able to walk her dog in 2 years. Has not really been doing exercises at home except the sit <> stand. Pt states that prolonged sitting without back support will increase back pain     Limitations  Standing;Walking    How long can you sit comfortably?  1 hr    How long can you stand comfortably?  10 minutes    How long can you walk comfortably?  2 or 3 blocks    Diagnostic tests  EMG testing BLEs and BUEs which were negative    Patient Stated Goals  Improve LE strength. Wants to use the machines at the gym. Walk more steadily. Be able to take her dog (16 lbs) out on a leash.     Currently in Pain?  No/denies    Pain Score  0-No pain    Pain Onset  More than a month ago                               PT Education - 06/20/19 1422     Education provided  Yes    Education Details  ther-ex    Starwood HotelsPerson(s) Educated  Patient    Methods  Explanation;Demonstration;Tactile cues;Verbal cues    Comprehension  Returned demonstration;Verbalized understanding        OBJECTIVE  Pt ambulating with rw     No latex band allergies    MedbridgeAccess Code: [email or web address]   Pt has pacemaker    Therapeutic exercise:   Gait with SPC CGA 100 ft x 2   seated trunk extension isometrics at neutral, manually resisted 10x10secondsfor 2 sets  SLS with occasional light touch assist  R 5x5 seconds L 5x5 seconds  Standing hip abduction with B UE assist 4 lbs each LE   R 10x3   L 10x3   Standing march using SPC on R 10x3 each LE to promote foot clearance during gait with SPC. CGA  Stepping over 3 mini hurdles using SPC 5x CGA  Difficulty clearing obstacles at times secondary to not clearing her feet. LOB multiple times, min to mod A to recover. Demonstrates fear of falling   Gait with SPC CGA to SBA 150 ft         Improved exercise technique, movement at target joints, use of target muscles after min to mod verbal, visual, tactile cues.    Patient response to treatment: Pt tolerated session well without aggravation of symptoms.     Clinical impression Continued working on LE strengthening to promote ability to support herself when performing standing tasks. Cues needed to place and maintain her center of gravity over her base of support to help with single leg related balance. Difficulty with foot clearance using SPC in which fear of falling may play a factor. LOB multiple times when performing exercise with min to mod A to recover. Pt tolerated session well without aggravation of symptoms. Pt will benefit from continued skilled physical therapy services to improve LE strength, balance, function, and ability to ambulate.      PT Short Term Goals -  04/25/19 1723      PT SHORT TERM GOAL #1   Title  Patient will be independent with her HEP to improve strength and balance.     Time  3    Period  Weeks    Status  On-going    Target Date  05/08/19  PT Long Term Goals - 06/11/19 1547      PT LONG TERM GOAL #1   Title  Patient will improve her 10 MWT to 0.8 m/s or more with rw to promote better community ambulation.     Baseline  0.5 seconds with rw (09/06/2018); 0.63 seconds average with rw (10/18/2018); 0.69 m/s average using rw (12/10/2018); 0.59 m/s using SPC and CGA (01/08/2019); 0.60 m/s using SPC (01/21/2019); 0.56 m/s with SPC to no AD assist, CGA (02/04/2019); 0.45 m/s average (04/17/2019); 0.53 m/s with SPC (06/06/2019)    Time  8    Period  Weeks    Status  On-going    Target Date  08/08/19      PT LONG TERM GOAL #2   Title  Patient will improve her DGI score using rw or least restrictive AD to 19/24 or more to promote balance.     Baseline  13/24 using rw (09/06/2018); 14/24 using rw (10/29/2018); 16/24 (12/10/2018); 14/24 using SPC (01/21/2019); 9/24 using SPC (04/17/2019); 14/24 (06/11/2019)    Time  8    Period  Weeks    Status  On-going    Target Date  08/08/19      PT LONG TERM GOAL #3   Title  Patient will improve bilateral LE strength by at least 1/2 MMT to promote ability to support herself when performing standing tasks and improve balance.     Time  8    Period  Weeks    Status  On-going    Target Date  08/08/19      PT LONG TERM GOAL #4   Title  Patient will have a decrease in back pain/ache to 5/10 or less to promote ability to perform standing tasks with less difficulty.    Baseline  8/10 at most for the past 3 months (09/06/2018); 5-6/10 at worst for the past 7 days (10/18/2018); 5/10 at worst for the past 7 days (12/10/2018)    Time  6    Period  Weeks    Status  Achieved            Plan - 06/20/19 1418    Clinical Impression Statement  Continued working on LE strengthening to promote ability to  support herself when performing standing tasks. Cues needed to place and maintain her center of gravity over her base of support to help with single leg related balance. Difficulty with foot clearance using SPC in which fear of falling may play a factor. LOB multiple times when performing exercise with min to mod A to recover. Pt tolerated session well without aggravation of symptoms. Pt will benefit from continued skilled physical therapy services to improve LE strength, balance, function, and ability to ambulate.    Personal Factors and Comorbidities  Age;Comorbidity 3+;Past/Current Experience;Fitness;Time since onset of injury/illness/exacerbation    Comorbidities   pacemaker, difficulty with healing of L elbow wound, peripheral vascular disease, hx of back surgery    Examination-Activity Limitations  Carry;Lift;Locomotion Level    Stability/Clinical Decision Making  Evolving/Moderate complexity    Rehab Potential  Fair    Clinical Impairments Affecting Rehab Potential  (-) chronicity of condition, age, comorbidities; (+) motivated    PT Frequency  2x / week    PT Duration  8 weeks    PT Treatment/Interventions  Aquatic Therapy;Gait training;Therapeutic activities;Therapeutic exercise;Balance training;Neuromuscular re-education;Patient/family education;Manual techniques;Dry needling    PT Next Visit Plan  trunk, hip, scapular strengthening, balance, gait, manual techniques PRN    PT Home Exercise  Plan  Sit<>stand, standing hip ext and abd, bridging, and heel raises    Consulted and Agree with Plan of Care  Patient       Patient will benefit from skilled therapeutic intervention in order to improve the following deficits and impairments:  Abnormal gait, Decreased activity tolerance, Decreased balance, Decreased endurance, Decreased range of motion, Decreased strength, Difficulty walking, Improper body mechanics, Postural dysfunction, Pain, Decreased knowledge of use of DME  Visit Diagnosis: 1.  Difficulty in walking, not elsewhere classified   2. Muscle weakness (generalized)   3. History of falling   4. Unsteadiness on feet   5. Chronic low back pain, unspecified back pain laterality, unspecified whether sciatica present        Problem List Patient Active Problem List   Diagnosis Date Noted  . Septic olecranon bursitis of left elbow 03/27/2018  . Lymphedema 10/11/2017  . Chronic venous insufficiency 10/11/2017  . Leg pain 10/11/2017  . Leg swelling 10/11/2017  . COPD (chronic obstructive pulmonary disease) (HCC) 10/11/2017  . Essential hypertension 10/11/2017    Loralyn Freshwater PT, DPT   06/20/2019, 3:27 PM  [facility] [address], [place], [address] Phone: [phone]   Fax:  [phone]  Name: [name] MRN: [id number] Date of Birth: March 06, 1930

## 2019-06-25 ENCOUNTER — Ambulatory Visit: Payer: Medicare Other

## 2019-06-25 ENCOUNTER — Other Ambulatory Visit: Payer: Self-pay

## 2019-06-25 DIAGNOSIS — R2681 Unsteadiness on feet: Secondary | ICD-10-CM

## 2019-06-25 DIAGNOSIS — R2689 Other abnormalities of gait and mobility: Secondary | ICD-10-CM

## 2019-06-25 DIAGNOSIS — M545 Low back pain, unspecified: Secondary | ICD-10-CM

## 2019-06-25 DIAGNOSIS — S41102S Unspecified open wound of left upper arm, sequela: Secondary | ICD-10-CM

## 2019-06-25 DIAGNOSIS — M25512 Pain in left shoulder: Secondary | ICD-10-CM

## 2019-06-25 DIAGNOSIS — R262 Difficulty in walking, not elsewhere classified: Secondary | ICD-10-CM

## 2019-06-25 DIAGNOSIS — M79604 Pain in right leg: Secondary | ICD-10-CM

## 2019-06-25 DIAGNOSIS — Z9181 History of falling: Secondary | ICD-10-CM

## 2019-06-25 DIAGNOSIS — G8929 Other chronic pain: Secondary | ICD-10-CM

## 2019-06-25 DIAGNOSIS — M542 Cervicalgia: Secondary | ICD-10-CM

## 2019-06-25 DIAGNOSIS — M6281 Muscle weakness (generalized): Secondary | ICD-10-CM

## 2019-06-25 NOTE — Therapy (Signed)
[facility] [address], [place], [address] Phone: [phone]   Fax:  [phone]  Physical Therapy Treatment  Patient Details  Name: [name] MRN: [id number] Date of Birth: [birth year] Referring Provider (PT): Cristopher Peru, MD   Encounter Date: 06/25/2019  PT End of Session - 06/25/19 1412    Visit Number  52    Number of Visits  94    Date for PT Re-Evaluation  08/08/19    Authorization Type  2    Authorization Time Period  of 10 progress report    PT Start Time  1405    PT Stop Time  1435    PT Time Calculation (min)  30 min    Equipment Utilized During Treatment  Gait belt    Activity Tolerance  Patient tolerated treatment well;Patient limited by fatigue    Behavior During Therapy  [facility] for tasks assessed/performed       Past Medical History:  Diagnosis Date  . Anemia   . GERD (gastroesophageal reflux disease)   . Hypertension   . Peripheral vascular disease (HCC)    possible neuropathies in lower extremeties  . PONV (postoperative nausea and vomiting)    happens sometimes but better with pre med of zofran  . Presence of permanent cardiac pacemaker   . Syncope     Past Surgical History:  Procedure Laterality Date  . ABDOMINAL HYSTERECTOMY  1969  . APPENDECTOMY  1969   with hysterectomy  . BACK SURGERY  2001   rods in back. surgery on back x 3  . COLONOSCOPY    . I&D EXTREMITY Left 03/27/2018   Procedure: IRRIGATION AND DEBRIDEMENT LEFT ELBOW / OLECRANON BURSA;  Surgeon: Christena Flake, MD;  Location: [facility];  Service: Orthopedics;  Laterality: Left;  . I&D EXTREMITY Left 06/05/2018   Procedure: IRRIGATION AND DEBRIDEMENT EXTREMITY;  Surgeon: Christena Flake, MD;  Location: [facility];  Service: Orthopedics;  Laterality: Left;  . INSERT / REPLACE / REMOVE PACEMAKER  2004  . JOINT REPLACEMENT Left 2004   partial knee replacement  . OLECRANON BURSECTOMY Left 03/27/2018   Procedure: LEFT  OLECRANON BURSA;  Surgeon: Christena Flake, MD;  Location: [facility];  Service: Orthopedics;  Laterality: Left;  . PACEMAKER INSERTION  2014   dual chamber for complete heart block    There were no vitals filed for this visit.  Subjective Assessment - 06/25/19 1407    Subjective  Pt reports today is a rough day regarding her neuropathy. She also says her elbow steri strips have now come off and she is concerned that its going too long before she follows up with doctor.    Pertinent History  Imparied gait. Pt states that she has had balance issues for too long. Feels more comfortable walking with her rw. Had tests which showed that she has neuropathy. However the doctor at [facility] said that her balance is coming from her back. Just wants to be strong so she can walk.  Started walking with her rw off and on for the past several months.  Also feels like she might need a new sleep number bed. Also had 2 wound surgeries at [name]. Pt fell in February 2019. Had to stop PT due to the L elbow surgery April 06, 2018 to remove a bursa. The wound still has not healed.   Currently sees a wound doctor.   Feels numbness L foot from  the ankle down.  No R foot numbness.  Sometimes unsure of where her L LE is in space.  Pt also states that she tires too quickly.  Has not been able to walk her dog in 2 years. Has not really been doing exercises at home except the sit <> stand. Pt states that prolonged sitting without back support will increase back pain     Currently in Pain?  --   Pt reports no pain while sitting and resting.      INTERVENTION THIS DATE:   *discussion and education on wound care, inspection of left elbow wound per request of patient.  -seated trunk extension isometrics at neutral, pillow resisted 10x10secondsfor2sets -Standing Marching, Single UE support on RW 2x10 bilat  -seated marching stabilization on dyndisc 2x10 bilat alternating pattern.           PT Short Term Goals -  04/25/19 1723      PT SHORT TERM GOAL #1   Title  Patient will be independent with her HEP to improve strength and balance.     Time  3    Period  Weeks    Status  On-going    Target Date  05/08/19        PT Long Term Goals - 06/11/19 1547      PT LONG TERM GOAL #1   Title  Patient will improve her 10 MWT to 0.8 m/s or more with rw to promote better community ambulation.     Baseline  0.5 seconds with rw (09/06/2018); 0.63 seconds average with rw (10/18/2018); 0.69 m/s average using rw (12/10/2018); 0.59 m/s using SPC and CGA (01/08/2019); 0.60 m/s using SPC (01/21/2019); 0.56 m/s with SPC to no AD assist, CGA (02/04/2019); 0.45 m/s average (04/17/2019); 0.53 m/s with SPC (06/06/2019)    Time  8    Period  Weeks    Status  On-going    Target Date  08/08/19      PT LONG TERM GOAL #2   Title  Patient will improve her DGI score using rw or least restrictive AD to 19/24 or more to promote balance.     Baseline  13/24 using rw (09/06/2018); 14/24 using rw (10/29/2018); 16/24 (12/10/2018); 14/24 using SPC (01/21/2019); 9/24 using SPC (04/17/2019); 14/24 (06/11/2019)    Time  8    Period  Weeks    Status  On-going    Target Date  08/08/19      PT LONG TERM GOAL #3   Title  Patient will improve bilateral LE strength by at least 1/2 MMT to promote ability to support herself when performing standing tasks and improve balance.     Time  8    Period  Weeks    Status  On-going    Target Date  08/08/19      PT LONG TERM GOAL #4   Title  Patient will have a decrease in back pain/ache to 5/10 or less to promote ability to perform standing tasks with less difficulty.    Baseline  8/10 at most for the past 3 months (09/06/2018); 5-6/10 at worst for the past 7 days (10/18/2018); 5/10 at worst for the past 7 days (12/10/2018)    Time  6    Period  Weeks    Status  Achieved            Plan - 06/25/19 1433    Clinical Impression Statement  Pt doing well this date overall, is more limited in balance  this date 2/2 neuropathy. Pt bring concerns about elbow wound, asks for inspection, upon which wound looks good, dry, closed, some mild scabbing at proximal end. Pt has many questions this session which do limit time for exericse. Overall pt tolerates activities well, but does tolerate activies well with minimal fatigue. Advanced trunk support to seated marching on dynadisc for advanced core stabilization.    Comorbidities   pacemaker, difficulty with healing of L elbow wound, peripheral vascular disease, hx of back surgery    Rehab Potential  Fair    PT Frequency  2x / week    PT Duration  8 weeks    PT Treatment/Interventions  Aquatic Therapy;Gait training;Therapeutic activities;Therapeutic exercise;Balance training;Neuromuscular re-education;Patient/family education;Manual techniques;Dry needling    PT Next Visit Plan  trunk, hip, scapular strengthening, balance, gait, manual techniques PRN    PT Home Exercise Plan  Sit<>stand, standing hip ext and abd, bridging, and heel raises    Consulted and Agree with Plan of Care  Patient       Patient will benefit from skilled therapeutic intervention in order to improve the following deficits and impairments:  Abnormal gait, Decreased activity tolerance, Decreased balance, Decreased endurance, Decreased range of motion, Decreased strength, Difficulty walking, Improper body mechanics, Postural dysfunction, Pain, Decreased knowledge of use of DME  Visit Diagnosis: 1. Difficulty in walking, not elsewhere classified   2. Muscle weakness (generalized)   3. History of falling   4. Unsteadiness on feet   5. Chronic low back pain, unspecified back pain laterality, unspecified whether sciatica present   6. Wound of left upper extremity, sequela   7. Pain in both lower extremities   8. Other abnormalities of gait and mobility   9. Chronic right shoulder pain   10. Neck pain   11. Chronic left shoulder pain        Problem List Patient Active Problem  List   Diagnosis Date Noted  . Septic olecranon bursitis of left elbow 03/27/2018  . Lymphedema 10/11/2017  . Chronic venous insufficiency 10/11/2017  . Leg pain 10/11/2017  . Leg swelling 10/11/2017  . COPD (chronic obstructive pulmonary disease) ([place]) 10/11/2017  . Essential hypertension 10/11/2017    2:44 PM, 06/25/19 Etta Grandchild, PT, DPT Physical Therapist - [place] [phone] (Office)    Berlene Dixson C 06/25/2019, 2:40 PM  [place] [facility] [address] [place], [address] Phone: [phone]   Fax:  [phone]  Name: [name] MRN: [id number] Date of Birth: 04-Nov-1930

## 2019-06-27 ENCOUNTER — Other Ambulatory Visit: Payer: Self-pay

## 2019-06-27 ENCOUNTER — Ambulatory Visit: Payer: Medicare Other

## 2019-06-27 DIAGNOSIS — R262 Difficulty in walking, not elsewhere classified: Secondary | ICD-10-CM

## 2019-06-27 DIAGNOSIS — M545 Low back pain, unspecified: Secondary | ICD-10-CM

## 2019-06-27 DIAGNOSIS — R2681 Unsteadiness on feet: Secondary | ICD-10-CM

## 2019-06-27 DIAGNOSIS — G8929 Other chronic pain: Secondary | ICD-10-CM

## 2019-06-27 DIAGNOSIS — S41102S Unspecified open wound of left upper arm, sequela: Secondary | ICD-10-CM

## 2019-06-27 DIAGNOSIS — M79605 Pain in left leg: Secondary | ICD-10-CM

## 2019-06-27 DIAGNOSIS — M6281 Muscle weakness (generalized): Secondary | ICD-10-CM

## 2019-06-27 DIAGNOSIS — R2689 Other abnormalities of gait and mobility: Secondary | ICD-10-CM

## 2019-06-27 DIAGNOSIS — M79604 Pain in right leg: Secondary | ICD-10-CM

## 2019-06-27 DIAGNOSIS — M25511 Pain in right shoulder: Secondary | ICD-10-CM

## 2019-06-27 DIAGNOSIS — M542 Cervicalgia: Secondary | ICD-10-CM

## 2019-06-27 DIAGNOSIS — Z9181 History of falling: Secondary | ICD-10-CM

## 2019-06-27 NOTE — Therapy (Signed)
[facility] [address] [place], [address] Phone: [phone]   Fax:  [phone]  Physical Therapy Treatment  Patient Details  Name: [name] MRN: [id number] Date of Birth: Nov 21, 1930 Referring Provider (PT): Cristopher Peru, MD   Encounter Date: 06/27/2019  PT End of Session - 06/27/19 1309    Visit Number  53    Number of Visits  94    Date for PT Re-Evaluation  08/08/19    Authorization Type  3    Authorization Time Period  10    PT Start Time  1305    PT Stop Time  1345    PT Time Calculation (min)  40 min    Activity Tolerance  Patient tolerated treatment well;Patient limited by fatigue    Behavior During Therapy  [facility] for tasks assessed/performed       Past Medical History:  Diagnosis Date  . Anemia   . GERD (gastroesophageal reflux disease)   . Hypertension   . Peripheral vascular disease (HCC)    possible neuropathies in lower extremeties  . PONV (postoperative nausea and vomiting)    happens sometimes but better with pre med of zofran  . Presence of permanent cardiac pacemaker   . Syncope     Past Surgical History:  Procedure Laterality Date  . ABDOMINAL HYSTERECTOMY  1969  . APPENDECTOMY  1969   with hysterectomy  . BACK SURGERY  2001   rods in back. surgery on back x 3  . COLONOSCOPY    . I&D EXTREMITY Left 03/27/2018   Procedure: IRRIGATION AND DEBRIDEMENT LEFT ELBOW / OLECRANON BURSA;  Surgeon: Christena Flake, MD;  Location: [facility];  Service: Orthopedics;  Laterality: Left;  . I&D EXTREMITY Left 06/05/2018   Procedure: IRRIGATION AND DEBRIDEMENT EXTREMITY;  Surgeon: Christena Flake, MD;  Location: [facility];  Service: Orthopedics;  Laterality: Left;  . INSERT / REPLACE / REMOVE PACEMAKER  2004  . JOINT REPLACEMENT Left 2004   partial knee replacement  . OLECRANON BURSECTOMY Left 03/27/2018   Procedure: LEFT OLECRANON BURSA;  Surgeon: Christena Flake, MD;  Location: [facility];   Service: Orthopedics;  Laterality: Left;  . PACEMAKER INSERTION  2014   dual chamber for complete heart block    There were no vitals filed for this visit.  Subjective Assessment - 06/27/19 1307    Subjective  Pt repors she is doing well today, but trying to not hurry, however she does have back to back appointments today.    Pertinent History  Imparied gait. Pt states that she has had balance issues for too long. Feels more comfortable walking with her rw. Had tests which showed that she has neuropathy. However the doctor at [facility] said that her balance is coming from her back. Just wants to be strong so she can walk.  Started walking with her rw off and on for the past several months.  Also feels like she might need a new sleep number bed. Also had 2 wound surgeries at [name]. Pt fell in February 2019. Had to stop PT due to the L elbow surgery April 06, 2018 to remove a bursa. The wound still has not healed.   Currently sees a wound doctor.   Feels numbness L foot from the ankle down.  No R foot numbness.  Sometimes unsure of where her L LE is in space.  Pt also states that she tires too quickly.  Has not been able to walk her dog in 2 years. Has not really been doing exercises at home except the sit <> stand. Pt states that prolonged sitting without back support will increase back pain     Currently in Pain?  --   "just wobbling pain, which doesn't hurt, just feels unpredictable"     INTERVENTION THIS DATE:  SET 1 -seated marching,alternate pattern 2x20, seated on dynadisc, arms across chest -Standing hip abduction with BUE assist 4 lbs each LE 2x10 SET 2 -seated trunk extension isometrics at neutral, pillow resisted, 2x10x5sec (added BUE horizontal abduction or row this date) -Standing Marching, Single UE support on RW 2x10 bilat  SET 3 -Normal stance on Foam 5x30sec  SET 4 -semi-narrow stance firm surface eyes closed 8x15sec  SET 5  -normal stance on foam with  horizontal head turns alternating sides, slow to allow for postural accomodation (~ 4 minutes total)  *minguard assist providedthroughout session as patient has low confidence with dynamic balance in general, and does well to have at least 1 hand assist when moving around in stance.     PT Short Term Goals - 04/25/19 1723      PT SHORT TERM GOAL #1   Title  Patient will be independent with her HEP to improve strength and balance.     Time  3    Period  Weeks    Status  On-going    Target Date  05/08/19        PT Long Term Goals - 06/11/19 1547      PT LONG TERM GOAL #1   Title  Patient will improve her 10 MWT to 0.8 m/s or more with rw to promote better community ambulation.     Baseline  0.5 seconds with rw (09/06/2018); 0.63 seconds average with rw (10/18/2018); 0.69 m/s average using rw (12/10/2018); 0.59 m/s using SPC and CGA (01/08/2019); 0.60 m/s using SPC (01/21/2019); 0.56 m/s with SPC to no AD assist, CGA (02/04/2019); 0.45 m/s average (04/17/2019); 0.53 m/s with SPC (06/06/2019)    Time  8    Period  Weeks    Status  On-going    Target Date  08/08/19      PT LONG TERM GOAL #2   Title  Patient will improve her DGI score using rw or least restrictive AD to 19/24 or more to promote balance.     Baseline  13/24 using rw (09/06/2018); 14/24 using rw (10/29/2018); 16/24 (12/10/2018); 14/24 using SPC (01/21/2019); 9/24 using SPC (04/17/2019); 14/24 (06/11/2019)    Time  8    Period  Weeks    Status  On-going    Target Date  08/08/19      PT LONG TERM GOAL #3   Title  Patient will improve bilateral LE strength by at least 1/2 MMT to promote ability to support herself when performing standing tasks and improve balance.     Time  8    Period  Weeks    Status  On-going    Target Date  08/08/19      PT LONG TERM GOAL #4   Title  Patient will have a decrease in back pain/ache to 5/10 or less to promote ability to perform standing tasks with less difficulty.    Baseline  8/10 at most for  the past 3 months (09/06/2018); 5-6/10 at worst for the past 7 days (10/18/2018); 5/10 at worst for the past 7 days (12/10/2018)    Time  6  Period  Weeks    Status  Achieved            Plan - 06/27/19 1351    Clinical Impression Statement  In general, patient demonstrating good tolerance to therapy session this date, reasonable accommodations are alllowed in-session to allow adequate rest between activities as needed. All interventional executed without any exacerbation of pain or other symptoms. Pt demonstrates focused motivation to fully participate in therapy to the best of ability. Session continued to focus on static and dynamic stabilization activities. Pt able to fit in much more activity this date, as rest breaks are provided throughout. Pt is able to progress some balance interventions, although deficits remain obvious to author, as well as easy fatiguability. Pt given intermittent multimodal cues to teach best possible form with exercises. Pt continues to make steady progress toward most goals. No home exercise updates made at this time.    Rehab Potential  Fair    PT Frequency  2x / week    PT Duration  8 weeks    PT Treatment/Interventions  Aquatic Therapy;Gait training;Therapeutic activities;Therapeutic exercise;Balance training;Neuromuscular re-education;Patient/family education;Manual techniques;Dry needling    PT Next Visit Plan  trunk, hip, scapular strengthening, balance, gait, manual techniques PRN    PT Home Exercise Plan  Sit<>stand, standing hip ext and abd, bridging, and heel raises    Consulted and Agree with Plan of Care  Patient       Patient will benefit from skilled therapeutic intervention in order to improve the following deficits and impairments:  Abnormal gait, Decreased activity tolerance, Decreased balance, Decreased endurance, Decreased range of motion, Decreased strength, Difficulty walking, Improper body mechanics, Postural dysfunction, Pain, Decreased  knowledge of use of DME  Visit Diagnosis: 1. Muscle weakness (generalized)   2. Difficulty in walking, not elsewhere classified   3. History of falling   4. Unsteadiness on feet   5. Chronic low back pain, unspecified back pain laterality, unspecified whether sciatica present   6. Wound of left upper extremity, sequela   7. Pain in both lower extremities   8. Other abnormalities of gait and mobility   9. Chronic right shoulder pain   10. Neck pain   11. Chronic left shoulder pain        Problem List Patient Active Problem List   Diagnosis Date Noted  . Septic olecranon bursitis of left elbow 03/27/2018  . Lymphedema 10/11/2017  . Chronic venous insufficiency 10/11/2017  . Leg pain 10/11/2017  . Leg swelling 10/11/2017  . COPD (chronic obstructive pulmonary disease) ([place]) 10/11/2017  . Essential hypertension 10/11/2017   1:56 PM, 06/27/19 Etta Grandchild, PT, DPT Physical Therapist - [place] [phone] (Office)    Elleen Coulibaly C 06/27/2019, 1:56 PM  [place] [facility] [address], [place], [address] Phone: [phone]   Fax:  [phone]  Name: MALONI MUSLEH MRN: [id number] Date of Birth: 02-21-1930

## 2019-06-28 ENCOUNTER — Encounter: Payer: Medicare Other | Attending: Physician Assistant | Admitting: Physician Assistant

## 2019-06-28 DIAGNOSIS — L97812 Non-pressure chronic ulcer of other part of right lower leg with fat layer exposed: Secondary | ICD-10-CM | POA: Insufficient documentation

## 2019-06-28 NOTE — Progress Notes (Signed)
ROZANNA, CORMANY ([id number]) Visit Report for 06/28/2019 Abuse/Suicide Risk Screen Details Patient Name: [name], [name] Date of Service: 06/28/2019 2:15 PM Medical Record Number: [id number] Patient Account Number: [id number] Date of Birth/Sex: Nov 30, 1930 (85 y.o. F) Treating RN: Army Melia Primary Care Hutch Rhett: Fulton Reek Other Clinician: Referring Lavaya Defreitas: Referral, Self Treating Marcheta Horsey/Extender: Melburn Hake, HOYT Weeks in Treatment: 0 Abuse/Suicide Risk Screen Items Answer ABUSE RISK SCREEN: Has anyone close to you tried to hurt or harm you recentlyo No Do you feel uncomfortable with anyone in your familyo No Has anyone forced you do things that you didnot want to doo No Electronic Signature(s) Signed: 06/28/2019 4:56:53 PM By: Army Melia Entered By: Army Melia on 06/28/2019 14:32:36 [name] ([id number]) -------------------------------------------------------------------------------- Activities of Daily Living Details Patient Name: [name] Date of Service: 06/28/2019 2:15 PM Medical Record Number: [id number] Patient Account Number: [id number] Date of Birth/Sex: [birth year] (85 y.o. F) Treating RN: Army Melia Primary Care Aniayah Alaniz: Fulton Reek Other Clinician: Referring Makynna Manocchio: Referral, Self Treating Ramal Eckhardt/Extender: Melburn Hake, HOYT Weeks in Treatment: 0 Activities of Daily Living Items Answer Activities of Daily Living (Please select one for each item) Drive Automobile Completely Able Take Medications Completely Able Use Telephone Completely Able Care for Appearance Completely Able Use Toilet Completely Able Bath / Shower Completely Able Dress Self Completely Able Feed Self Completely Able Walk Completely Able Get In / Out Bed Completely Able Housework Completely Able Prepare Meals Completely Able Handle Money Completely Able Shop for Self Completely Able Electronic Signature(s) Signed: 06/28/2019 4:56:53 PM By: Army Melia Entered By: Army Melia on 06/28/2019 14:32:52 [name] ([id number]) -------------------------------------------------------------------------------- Education Screening Details Patient Name: [name] Date of Service: 06/28/2019 2:15 PM Medical Record Number: [id number] Patient Account Number: [id number] Date of Birth/Sex: 06-13-1930 (85 y.o. F) Treating RN: Army Melia Primary Care Vassie Kugel: Fulton Reek Other Clinician: Referring Taleeya Blondin: Referral, Self Treating Cadon Raczka/Extender: Sharalyn Ink in Treatment: 0 Primary Learner Assessed: Patient Learning Preferences/Education Level/Primary Language Learning Preference: Explanation, Demonstration Highest Education Level: College or Above Preferred Language: English Cognitive Barrier Language Barrier: No Translator Needed: No Memory Deficit: No Emotional Barrier: No Cultural/Religious Beliefs Affecting Medical Care: No Physical Barrier Impaired Vision: No Impaired Hearing: No Decreased Hand dexterity: No Knowledge/Comprehension Knowledge Level: High Comprehension Level: High Ability to understand written High instructions: Ability to understand verbal High instructions: Motivation Anxiety Level: Calm Cooperation: Cooperative Education Importance: Acknowledges Need Interest in Health Problems: Asks Questions Perception: Coherent Willingness to Engage in Self- High Management Activities: Readiness to Engage in Self- High Management Activities: Electronic Signature(s) Signed: 06/28/2019 4:56:53 PM By: Army Melia Entered By: Army Melia on 06/28/2019 14:33:37 [name] ([id number]) -------------------------------------------------------------------------------- Fall Risk Assessment Details Patient Name: [name] Date of Service: 06/28/2019 2:15 PM Medical Record Number: [id number] Patient Account Number: [id number] Date of Birth/Sex: [birth year] (85 y.o. F) Treating RN:  Army Melia Primary Care Jaylena Holloway: Fulton Reek Other Clinician: Referring Makynzie Dobesh: Referral, Self Treating Petrita Blunck/Extender: Melburn Hake, HOYT Weeks in Treatment: 0 Fall Risk Assessment Items Have you had 2 or more falls in the last 12 monthso 0 No Have you had any fall that resulted in injury in the last 12 monthso 0 No FALLS RISK SCREEN History of falling - immediate or within 3 months 0 No Secondary diagnosis (Do you have 2 or more medical diagnoseso) 0 No Ambulatory aid None/bed rest/wheelchair/nurse 0 No Crutches/cane/walker 0 No Furniture 0 No Intravenous therapy Access/Saline/Heparin Lock 0 No Gait/Transferring Normal/ bed rest/ wheelchair 0 No  Weak (short steps with or without shuffle, stooped but able to lift head while 0 No walking, may seek support from furniture) Impaired (short steps with shuffle, may have difficulty arising from chair, head 0 No down, impaired balance) Mental Status Oriented to own ability 0 No Electronic Signature(s) Signed: 06/28/2019 4:56:53 PM By: Rodell Perna Entered By: Rodell Perna on 06/28/2019 14:33:48 [name] ([id number]) -------------------------------------------------------------------------------- Foot Assessment Details Patient Name: [name] Date of Service: 06/28/2019 2:15 PM Medical Record Number: [id number] Patient Account Number: [id number] Date of Birth/Sex: 1930/07/21 (85 y.o. F) Treating RN: Rodell Perna Primary Care Michaella Imai: Aram Beecham Other Clinician: Referring Berthold Glace: Referral, Self Treating Primitivo Merkey/Extender: Linwood Dibbles, HOYT Weeks in Treatment: 0 Foot Assessment Items Site Locations + = Sensation present, - = Sensation absent, C = Callus, U = Ulcer R = Redness, W = Warmth, M = Maceration, PU = Pre-ulcerative lesion F = Fissure, S = Swelling, D = Dryness Assessment Right: Left: Other Deformity: No No Prior Foot Ulcer: No No Prior Amputation: No No Charcot Joint: No No Ambulatory  Status: Ambulatory Without Help Gait: Steady Electronic Signature(s) Signed: 06/28/2019 4:56:53 PM By: Rodell Perna Entered By: Rodell Perna on 06/28/2019 14:34:35 [name] ([id number]) -------------------------------------------------------------------------------- Nutrition Risk Screening Details Patient Name: [name] Date of Service: 06/28/2019 2:15 PM Medical Record Number: [id number] Patient Account Number: [id number] Date of Birth/Sex: November 13, 1930 (85 y.o. F) Treating RN: Rodell Perna Primary Care Kataleia Quaranta: Aram Beecham Other Clinician: Referring Sharyn Brilliant: Referral, Self Treating Marcedes Tech/Extender: Linwood Dibbles, HOYT Weeks in Treatment: 0 Height (in): 60 Weight (lbs): 125 Body Mass Index (BMI): 24.4 Nutrition Risk Screening Items Score Screening NUTRITION RISK SCREEN: I have an illness or condition that made me change the kind and/or amount of 0 No food I eat I eat fewer than two meals per day 0 No I eat few fruits and vegetables, or milk products 0 No I have three or more drinks of beer, liquor or wine almost every day 0 No I have tooth or mouth problems that make it hard for me to eat 0 No I don't always have enough money to buy the food I need 0 No I eat alone most of the time 0 No I take three or more different prescribed or over-the-counter drugs a day 0 No Without wanting to, I have lost or gained 10 pounds in the last six months 0 No I am not always physically able to shop, cook and/or feed myself 0 No Nutrition Protocols Good Risk Protocol 0 No interventions needed Moderate Risk Protocol High Risk Proctocol Risk Level: Good Risk Score: 0 Electronic Signature(s) Signed: 06/28/2019 4:56:53 PM By: Rodell Perna Entered By: Rodell Perna on 06/28/2019 14:33:54

## 2019-06-30 NOTE — Progress Notes (Signed)
Natasha, Barnett ([id number]) Visit Report for 06/28/2019 Allergy List Details Patient Name: Natasha, Barnett. Date of Service: 06/28/2019 2:15 PM Medical Record Number: [id number] Patient Account Number: [id number] Date of Birth/Sex: Jul 08, 1930 (85 y.o. F) Treating RN: Army Melia Primary Care Jeraldine Primeau: Fulton Reek Other Clinician: Referring Shown Dissinger: Referral, Self Treating Sharetta Ricchio/Extender: STONE III, HOYT Weeks in Treatment: 0 Allergies Active Allergies Penicillins Reaction: rash Severity: Moderate Sporanox Reaction: elevated LFT Severity: Moderate adhesive Reaction: skin alergy Severity: Moderate codeine Reaction: n/v Severity: Moderate Cipro Reaction: diarrhea Severity: Moderate bioxin Reaction: upset stomach doxycycline Reaction: flu like systems Allergy Notes Electronic Signature(s) Signed: 06/28/2019 4:56:53 PM By: Army Melia Entered By: Army Melia on 06/28/2019 14:29:46 [name] ([id number]) -------------------------------------------------------------------------------- Arrival Information Details Patient Name: [name] Date of Service: 06/28/2019 2:15 PM Medical Record Number: [id number] Patient Account Number: [id number] Date of Birth/Sex: January 11, 1930 (85 y.o. F) Treating RN: Army Melia Primary Care Karl Erway: Fulton Reek Other Clinician: Referring Drevin Ortner: Referral, Self Treating Nyaisha Simao/Extender: Melburn Hake, HOYT Weeks in Treatment: 0 Visit Information Patient Arrived: Walker Arrival Time: 14:22 Accompanied By: self Transfer Assistance: None History Since Last Visit Added or deleted any medications: No Any new allergies or adverse reactions: No Had a fall or experienced change in activities of daily living that may affect risk of falls: No Signs or symptoms of abuse/neglect since last visito No Hospitalized since last visit: No Has Dressing in Place as Prescribed: Yes Electronic Signature(s) Signed: 06/28/2019 4:56:53 PM  By: Army Melia Entered By: Army Melia on 06/28/2019 14:23:07 [name] ([id number]) -------------------------------------------------------------------------------- Clinic Level of Care Assessment Details Patient Name: [name] Date of Service: 06/28/2019 2:15 PM Medical Record Number: [id number] Patient Account Number: [id number] Date of Birth/Sex: 1930/03/27 (85 y.o. F) Treating RN: Montey Hora Primary Care Bradyn Vassey: Fulton Reek Other Clinician: Referring Dashanna Kinnamon: Referral, Self Treating Ji Fairburn/Extender: Melburn Hake, HOYT Weeks in Treatment: 0 Clinic Level of Care Assessment Items TOOL 2 Quantity Score []  - Use when only an EandM is performed on the INITIAL visit 0 ASSESSMENTS - Nursing Assessment / Reassessment X - General Physical Exam (combine w/ comprehensive assessment (listed just below) when 1 20 performed on new pt. evals) X- 1 25 Comprehensive Assessment (HX, ROS, Risk Assessments, Wounds Hx, etc.) ASSESSMENTS - Wound and Skin Assessment / Reassessment X - Simple Wound Assessment / Reassessment - one wound 1 5 []  - 0 Complex Wound Assessment / Reassessment - multiple wounds []  - 0 Dermatologic / Skin Assessment (not related to wound area) ASSESSMENTS - Ostomy and/or Continence Assessment and Care []  - Incontinence Assessment and Management 0 []  - 0 Ostomy Care Assessment and Management (repouching, etc.) PROCESS - Coordination of Care X - Simple Patient / Family Education for ongoing care 1 15 []  - 0 Complex (extensive) Patient / Family Education for ongoing care X- 1 10 Staff obtains Programmer, systems, Records, Test Results / Process Orders []  - 0 Staff telephones HHA, Nursing Homes / Clarify orders / etc []  - 0 Routine Transfer to another Facility (non-emergent condition) []  - 0 Routine Hospital Admission (non-emergent condition) X- 1 15 New Admissions / Biomedical engineer / Ordering NPWT, Apligraf, etc. []  - 0 Emergency Hospital  Admission (emergent condition) X- 1 10 Simple Discharge Coordination []  - 0 Complex (extensive) Discharge Coordination PROCESS - Special Needs []  - Pediatric / Minor Patient Management 0 []  - 0 Isolation Patient Management Natasha, Barnett ([id number]) []  - 0 Hearing / Language / Visual special needs []  - 0 Assessment of Community assistance (  transportation, D/C planning, etc.) []  - 0 Additional assistance / Altered mentation []  - 0 Support Surface(s) Assessment (bed, cushion, seat, etc.) INTERVENTIONS - Wound Cleansing / Measurement X - Wound Imaging (photographs - any number of wounds) 1 5 []  - 0 Wound Tracing (instead of photographs) X- 1 5 Simple Wound Measurement - one wound []  - 0 Complex Wound Measurement - multiple wounds X- 1 5 Simple Wound Cleansing - one wound []  - 0 Complex Wound Cleansing - multiple wounds INTERVENTIONS - Wound Dressings X - Small Wound Dressing one or multiple wounds 1 10 []  - 0 Medium Wound Dressing one or multiple wounds []  - 0 Large Wound Dressing one or multiple wounds []  - 0 Application of Medications - injection INTERVENTIONS - Miscellaneous []  - External ear exam 0 []  - 0 Specimen Collection (cultures, biopsies, blood, body fluids, etc.) []  - 0 Specimen(s) / Culture(s) sent or taken to Lab for analysis []  - 0 Patient Transfer (multiple staff / / Similar devices) []  - 0 Simple Staple / Suture removal (25 or less) []  - 0 Complex Staple / Suture removal (26 or more) []  - 0 Hypo / Hyperglycemic Management (close monitor of Blood Glucose) X- 1 15 Ankle / Brachial Index (ABI) - do not check if billed separately Has the patient been seen at the hospital within the last three years: Yes Total Score: 140 Level Of Care: New/Established - Level 4 Electronic Signature(s) Signed: 06/28/2019 5:37:40 PM By: Entered By: on 06/28/2019 14:58:28  ( ) -------------------------------------------------------------------------------- Encounter Discharge Information Details Patient Name: Date of Service: 06/28/2019 2:15 PM Medical Record Number: Patient Account Number: Date of Birth/Sex: 1930-05-01 (85 y.o. F) Treating RN: Primary Care Parisa Pinela: Other Clinician: Referring Belita Warsame: Referral, Self Treating Evalynn Hankins/Extender: , HOYT Weeks in Treatment: 0 Encounter Discharge Information Items Post Procedure Vitals Discharge Condition: Stable Temperature (F): 98.5 Ambulatory Status: Walker Pulse (bpm): 68 Discharge Destination: Home Respiratory Rate (breaths/min): 16 Transportation: Private Auto Blood Pressure (mmHg): 135/69 Accompanied By: self Schedule Follow-up Appointment: Yes Clinical Summary of Care: Electronic Signature(s) Signed: 06/28/2019 5:37:40 PM By: Curtis Sites Entered By: Curtis Sites on 06/28/2019 15:02:24 [name] ([id number]) -------------------------------------------------------------------------------- Lower Extremity Assessment Details Patient Name: [name] Date of Service: 06/28/2019 2:15 PM Medical Record Number: [id number] Patient Account Number: [id number] Date of Birth/Sex: 1930-07-30 (85 y.o. F) Treating RN: Curtis Sites Primary Care Kenzee Bassin: Aram Beecham Other Clinician: Referring Pinkie Manger: Referral, Self Treating Novalie Leamy/Extender: Linwood Dibbles, HOYT Weeks in Treatment: 0 Edema Assessment Assessed: [Left: No] [Right: No] Edema: [Left: N] [Right: o] Calf Left: Right: Point of Measurement: 30 cm From Medial Instep cm 34.6 cm Ankle Left: Right: Point of Measurement: 7 cm From Medial Instep cm 21.3 cm Vascular Assessment Pulses: Dorsalis Pedis Palpable: [Right:Yes] Blood Pressure: Brachial: [Right:135] Dorsalis Pedis: 138 Ankle: Posterior Tibial: 160 Ankle Brachial Index:  [Right:1.19] Electronic Signature(s) Signed: 06/28/2019 4:56:53 PM By: Curtis Sites Entered By: Curtis Sites on 06/28/2019 14:35:30 [name] ([id number]) -------------------------------------------------------------------------------- Multi Wound Chart Details Patient Name: [name] Date of Service: 06/28/2019 2:15 PM Medical Record Number: [id number] Patient Account Number: [id number] Date of Birth/Sex: 03-04-1930 (85 y.o. F) Treating RN: Rodell Perna Primary Care Dulcey Riederer: Aram Beecham Other Clinician: Referring Janene Yousuf: Referral, Self Treating Jimena Wieczorek/Extender: Linwood Dibbles, HOYT Weeks in Treatment: 0 Vital Signs Height(in): 60 Pulse(bpm): 68 Weight(lbs): 125 Blood Pressure(mmHg): 135/39 Body Mass Index(BMI): 24 Temperature(F): 98.5 Respiratory Rate 16 (breaths/min):  Photos: [N/A:N/A] Wound Location: Right Lower Leg - Anterior N/A N/A Wounding Event: Trauma N/A N/A Primary Etiology: Skin Tear N/A N/A Date Acquired: 06/27/2019 N/A N/A Weeks of Treatment: 0 N/A N/A Wound Status: Open N/A N/A Measurements L x W x D 4x3.4x0.5 N/A N/A (cm) Area (cm) : 10.681 N/A N/A Volume (cm) : 5.341 N/A N/A Classification: Partial Thickness N/A N/A Exudate Amount: Medium N/A N/A Exudate Type: Sanguinous N/A N/A Exudate Color: red N/A N/A Granulation Amount: Large (67-100%) N/A N/A Granulation Quality: Red N/A N/A Necrotic Amount: Small (1-33%) N/A N/A Necrotic Tissue: Eschar, Adherent Slough N/A N/A Exposed Structures: Fat Layer (Subcutaneous N/A N/A Tissue) Exposed: Yes Fascia: No Tendon: No Muscle: No Joint: No Bone: No Epithelialization: None N/A N/A Treatment Notes [name], [name] ([id number]) Electronic Signature(s) Signed: 06/28/2019 5:37:40 PM By: Curtis Sitesorthy, Joanna Entered By: Curtis Sitesorthy, Joanna on 06/28/2019 14:48:15 [name], [name] ([id number]) -------------------------------------------------------------------------------- Multi-Disciplinary Care Plan  Details Patient Name: [name], [name] Date of Service: 06/28/2019 2:15 PM Medical Record Number: [id number] Patient Account Number: [id number] Date of Birth/Sex: 11/25/1930 (85 y.o. F) Treating RN: Curtis Sitesorthy, Joanna Primary Care Stanislaus Kaltenbach: Aram BeechamSparks, Jeffrey Other Clinician: Referring Jovane Foutz: Referral, Self Treating Robb Sibal/Extender: Linwood DibblesSTONE III, HOYT Weeks in Treatment: 0 Active Inactive Abuse / Safety / Falls / Self Care Management Nursing Diagnoses: Potential for falls Goals: Patient will remain injury free related to falls Date Initiated: 06/28/2019 Target Resolution Date: 09/21/2019 Goal Status: Active Interventions: Assess fall risk on admission and as needed Notes: Wound/Skin Impairment Nursing Diagnoses: Impaired tissue integrity Goals: Ulcer/skin breakdown will heal within 14 weeks Date Initiated: 06/28/2019 Target Resolution Date: 09/21/2019 Goal Status: Active Interventions: Assess patient/caregiver ability to obtain necessary supplies Assess patient/caregiver ability to perform ulcer/skin care regimen upon admission and as needed Assess ulceration(s) every visit Notes: Electronic Signature(s) Signed: 06/28/2019 5:37:40 PM By: Curtis Sitesorthy, Joanna Entered By: Curtis Sitesorthy, Joanna on 06/28/2019 14:47:37 [name], [name] ([id number]) -------------------------------------------------------------------------------- Pain Assessment Details Patient Name: [name], Katti V. Date of Service: 06/28/2019 2:15 PM Medical Record Number: [id number] Patient Account Number: [id number] Date of Birth/Sex: 10/10/1930 (85 y.o. F) Treating RN: Rodell PernaScott, Dajea Primary Care Srihith Aquilino: Aram BeechamSparks, Jeffrey Other Clinician: Referring Payton Moder: Referral, Self Treating Zein Helbing/Extender: Linwood DibblesSTONE III, HOYT Weeks in Treatment: 0 Active Problems Location of Pain Severity and Description of Pain Patient Has Paino No Site Locations Pain Management and Medication Current Pain Management: Electronic Signature(s) Signed:  06/28/2019 4:56:53 PM By: Rodell PernaScott, Dajea Entered By: Rodell PernaScott, Dajea on 06/28/2019 14:23:13 [name], [name] ([id number]) -------------------------------------------------------------------------------- Patient/Caregiver Education Details Patient Name: [name], [name] Date of Service: 06/28/2019 2:15 PM Medical Record Number: [id number] Patient Account Number: [id number] Date of Birth/Gender: 06/01/1930 (85 y.o. F) Treating RN: Curtis Sitesorthy, Joanna Primary Care Physician: Aram BeechamSparks, Jeffrey Other Clinician: Referring Physician: Referral, Self Treating Physician/Extender: Skeet SimmerSTONE III, HOYT Weeks in Treatment: 0 Education Assessment Education Provided To: Patient Education Topics Provided Wound/Skin Impairment: Handouts: Other: wound care as ordered Methods: Demonstration, Explain/Verbal Responses: State content correctly Electronic Signature(s) Signed: 06/28/2019 5:37:40 PM By: Curtis Sitesorthy, Joanna Entered By: Curtis Sitesorthy, Joanna on 06/28/2019 14:58:58 [name], Jaszmine V. ([id number]) -------------------------------------------------------------------------------- Wound Assessment Details Patient Name: [name], Janera V. Date of Service: 06/28/2019 2:15 PM Medical Record Number: [id number] Patient Account Number: [id number] Date of Birth/Sex: 01/25/1930 (85 y.o. F) Treating RN: Rodell PernaScott, Dajea Primary Care Emmalee Solivan: Aram BeechamSparks, Jeffrey Other Clinician: Referring Gladyce Mcray: Referral, Self Treating Ardis Fullwood/Extender: STONE III, HOYT Weeks in Treatment: 0 Wound Status Wound Number: [id number] Primary Etiology: Skin Tear Wound Location: Right Lower Leg - Anterior Wound Status: Open Wounding Event: Trauma Date Acquired:  06/27/2019 Weeks Of Treatment: 0 Clustered Wound: No Photos Wound Measurements Length: (cm) 4 % Reduction Width: (cm) 3.4 % Reduction Depth: (cm) 0.5 Epithelializ Area: (cm) 10.681 Tunneling: Volume: (cm) 5.341 Undermining in Area: in Volume: ation: None No : No Wound Description Classification:  Partial Thickness Foul Odor A Exudate Amount: Medium Slough/Fibr Exudate Type: Sanguinous Exudate Color: red fter Cleansing: No ino Yes Wound Bed Granulation Amount: Large (67-100%) Exposed Structure Granulation Quality: Red Fascia Exposed: No Necrotic Amount: Small (1-33%) Fat Layer (Subcutaneous Tissue) Exposed: Yes Necrotic Quality: Eschar, Adherent Slough Tendon Exposed: No Muscle Exposed: No Joint Exposed: No Bone Exposed: No Treatment Notes Wound #11 (Right, Anterior Lower Leg) NINNIE, PERKOWSKI ([id number]) Notes xeroform, non adherent pad, conform and netting Electronic Signature(s) Signed: 06/28/2019 4:56:53 PM By: Rodell Perna Entered By: Rodell Perna on 06/28/2019 14:28:16 [name] ([id number]) -------------------------------------------------------------------------------- Vitals Details Patient Name: [name] Date of Service: 06/28/2019 2:15 PM Medical Record Number: [id number] Patient Account Number: [id number] Date of Birth/Sex: October 26, 1930 (85 y.o. F) Treating RN: Rodell Perna Primary Care Martrice Apt: Aram Beecham Other Clinician: Referring Nikolis Berent: Referral, Self Treating Courtni Balash/Extender: Linwood Dibbles, HOYT Weeks in Treatment: 0 Vital Signs Time Taken: 14:23 Temperature (F): 98.5 Height (in): 60 Pulse (bpm): 68 Source: Stated Respiratory Rate (breaths/min): 16 Weight (lbs): 125 Blood Pressure (mmHg): 135/39 Source: Stated Reference Range: 80 - 120 mg / dl Body Mass Index (BMI): 24.4 Electronic Signature(s) Signed: 06/28/2019 4:56:53 PM By: Rodell Perna Entered By: Rodell Perna on 06/28/2019 14:25:35

## 2019-06-30 NOTE — Progress Notes (Addendum)
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 could consider either another round of an amniotic advanced treatment product example epifix or perhaps regranex. Understandably the patient is a bit frustrated. We did send her to plastic surgery for a consult. Other than prescribing her a brace to immobilize the elbow they did not think she was a candidate for any further surgery. Notable that the patient is not using the brace today 3/18-Patient  returns for attention to the left elbow area which apparently looked red at the home health visit. Patient's elbow looks the same if not better compared with last visit. The area of ulceration remains the same, the base appears healthy. We are continuing to use endoform she has been encouraged to use the brace to keep the elbow straight 3/25; the patient has an appointment at the [facility] wound care center on 4/2. I had actually put her out indefinitely however she seems to want to come back here every week. This week she complains of increased pain and malodor. Dimensions of the elbow wound are larger. We have been using endoform 4/8; the patient went to [facility] where they apparently gave her meta honey and some border foam with a Tubigrip. She has been using this for a week. She says she was very impressed with them there. They did not offer her any surgical consultation. She seems to be coming back here for a second opinion on this, she does not wish to drive to [facility] every week 4/22; still using Medihoney foam border and a Tubigrip. Actually do not think she has anything on the arm at all specifically she is not using her brace 5/6; she has been using medihoney without a lot of improvement. Undermining maximum at 9:00 at 0.5 cm may be somewhat better. She is still complaining of discomfort. She uses her elbow brace at night but is not using anything on the arm during the day, she finds it too restrictive 5/13; we switch the patient to endoform AG last week. She is complaining of more pain and worried about some circumferential erythema around the wound. I had planned to consider epifix in this wound however the patient came in with a request to see a plastic surgeon in [place] by the name of Dr. Harl Bowie who cared for a friend of hers. Noteworthy that I have already sent her to one plastic surgeon and she went for another second opinion at [facility] and they apparently did not send her to a plastic  surgeon nevertheless the patient is fairly convinced that she might benefit from a skin graft which I am doubtful. She also has rheumatoid arthritis and is on methotrexate. This was originally a surgical wound for a bursectomy a year or 2 ago Readmission: 06/28/19 on evaluation today patient presents today for reevaluation but this is due to a new issue her elbow has completely healed and looks excellent. Her right anterior lower leg has a skin tear which was sustained from her dog  who jumped up on her and inadvertently scratch the area causing the skin tear. This happened yesterday. She is having some discomfort but fortunately nothing too significant which is good news. No fevers, chills, nausea, or vomiting noted at this time. The skin fortunately was knocked one completely off and we are gonna see about we approximate in the skin as best we can in using Steri-Strips to hold this in place obviously if we can get some of this to reattach that would be beneficial. [name], [name] ([id number]) Patient History Information obtained from Patient. Allergies Penicillins (Severity: Moderate, Reaction: rash), Sporanox (Severity: Moderate, Reaction: elevated LFT), adhesive (Severity: Moderate, Reaction: skin alergy), codeine (Severity: Moderate, Reaction: n/v), Cipro (Severity: Moderate, Reaction: diarrhea), bioxin (Reaction: upset stomach), doxycycline (Reaction: flu like systems) Family History Heart Disease - Siblings,Father, No family history of Cancer, Diabetes, Hereditary Spherocytosis, Hypertension, Kidney Disease, Lung Disease, Seizures, Stroke, Thyroid Problems, Tuberculosis. Social History Never smoker, Alcohol Use - Daily, Drug Use - No History, Caffeine Use - Never. Medical History Eyes Patient has history of Cataracts, Glaucoma, Optic Neuritis Ear/Nose/Mouth/Throat Patient has history of Chronic sinus problems/congestion, Middle ear problems Medical And Surgical History  Notes Constitutional Symptoms (General Health) Back pain Ear/Nose/Mouth/Throat bilateral hearing aides Review of Systems (ROS) Ear/Nose/Mouth/Throat Denies complaints or symptoms of Difficult clearing ears, Sinusitis. Hematologic/Lymphatic Denies complaints or symptoms of Bleeding / Clotting Disorders, Human Immunodeficiency Virus. Respiratory Denies complaints or symptoms of Chronic or frequent coughs, Shortness of Breath. Cardiovascular Denies complaints or symptoms of Chest pain, LE edema. Gastrointestinal Denies complaints or symptoms of Frequent diarrhea, Nausea, Vomiting. Endocrine Denies complaints or symptoms of Hepatitis, Thyroid disease, Polydypsia (Excessive Thirst). Genitourinary Denies complaints or symptoms of Kidney failure/ Dialysis, Incontinence/dribbling. Immunological Denies complaints or symptoms of Hives, Itching. Integumentary (Skin) Denies complaints or symptoms of Wounds, Bleeding or bruising tendency, Breakdown, Swelling. Musculoskeletal Denies complaints or symptoms of Muscle Pain, Muscle Weakness. Neurologic Denies complaints or symptoms of Numbness/parasthesias, Focal/Weakness. Psychiatric Denies complaints or symptoms of Anxiety, Claustrophobia. [name], [name] ([id number]) Objective Constitutional patient is hypertensive.. pulse regular and within target range for patient.Marland Kitchen respirations regular, non-labored and within target range for patient.Marland Kitchen temperature within target range for patient.. Well-nourished and well-hydrated in no acute distress. Vitals Time Taken: 2:23 PM, Height: 60 in, Source: Stated, Weight: 125 lbs, Source: Stated, BMI: 24.4, Temperature: 98.5 F, Pulse: 68 bpm, Respiratory Rate: 16 breaths/min, Blood Pressure: 135/39 mmHg. Eyes conjunctiva clear no eyelid edema noted. pupils equal round and reactive to light and accommodation. Ears, Nose, Mouth, and Throat no gross abnormality of ear auricles or external auditory canals.  normal hearing noted during conversation. mucus membranes moist. Respiratory normal breathing without difficulty. clear to auscultation bilaterally. Cardiovascular regular rate and rhythm with normal S1, S2. 2+ dorsalis pedis/posterior tibialis pulses. no clubbing, cyanosis, significant edema, Gastrointestinal (GI) soft, non-tender, non-distended, +BS. no ventral hernia noted. Musculoskeletal Patient unable to walk without assistance. no significant deformity or arthritic changes, no loss or range of motion, no clubbing. Psychiatric this patient is able to make decisions and demonstrates good insight into disease process. Alert and Oriented x 3. pleasant and cooperative. General Notes: At this point on evaluation I did actually go ahead and clean up the wound as best I could underneath and then again we approximated the edges of the skin tear as best I could over where they were 20 back. The patient tolerated this with only minimal discomfort which was good news. Once I approximated this I then applied to Steri-Strips over the  area which he tolerated without complication and this seemed to do quite well. With that being said as I explained to the patient the big question is gonna be whether or not this will actually reattach and if so how much of it reattach is and how it much becomes necrotic and may need to be removed. The patient understands. Integumentary (Hair, Skin) Wound #11 status is Open. Original cause of wound was Trauma. The wound is located on the Right,Anterior Lower Leg. The wound measures 4cm length x 3.4cm width x 0.5cm depth; 10.681cm^2 area and 5.341cm^3 volume. There is Fat Layer (Subcutaneous Tissue) Exposed exposed. There is no tunneling or undermining noted. There is a medium amount of sanguinous drainage noted. There is large (67-100%) red granulation within the wound bed. There is a small (1-33%) amount of necrotic tissue within the wound bed including Eschar and  Adherent Slough. Assessment [name], [name] ([id number]) Active Problems ICD-10 Unspecified open wound, right lower leg, initial encounter Non-pressure chronic ulcer of other part of right lower leg with fat layer exposed Struck by dog, initial encounter Procedures Wound #11 Pre-procedure diagnosis of Wound #11 is a Skin Tear located on the Right,Anterior Lower Leg . There was a Chemical/Enzymatic/Mechanical debridement performed by STONE III, HOYT E., PA-C. With the following instrument(s): saline and gauze after achieving pain control using Lidocaine 4% Topical Solution. Other agent used was Saline. A time out was conducted at 14:55, prior to the start of the procedure. There was no bleeding. The procedure was tolerated well with a pain level of 0 throughout and a pain level of 0 following the procedure. Post Debridement Measurements: 4cm length x 3.4cm width x 0.2cm depth; 2.136cm^3 volume. Character of Wound/Ulcer Post Debridement is improved. Post procedure Diagnosis Wound #11: Same as Pre-Procedure Plan Wound Cleansing: Wound #11 Right,Anterior Lower Leg: Clean wound with Normal Saline. Primary Wound Dressing: Wound #11 Right,Anterior Lower Leg: Xeroform Secondary Dressing: Wound #11 Right,Anterior Lower Leg: Conform/Kerlix Non-adherent pad Dressing Change Frequency: Wound #11 Right,Anterior Lower Leg: Other: - Tuesdays and Fridays Follow-up Appointments: Wound #11 Right,Anterior Lower Leg: Return Appointment in 1 week. The following medication(s) was prescribed: azithromycin oral 250 mg tablet 1 2 tablets oral Taken by mouth on day 1 then 1 tab po daily for 4 days starting 07/02/2019 We will see her back for reevaluation next week to see were things stand and she's in agreement that plan. If anything changes worsens in the meantime shall contact the office and let me know. Otherwise my hope is that she'll be able to get all this under control and that hopefully the skin  tear will be approximate and reattach as much as possible to limit the need for healing by second intent. We will see what it looks like next week. [name], [name] ([id number]) Please see above for specific wound care orders. We will see patient for re-evaluation in 1 week(s) here in the clinic. If anything worsens or changes patient will contact our office for additional recommendations. Electronic Signature(s) Signed: 07/02/2019 6:14:16 PM By: Lenda Kelp PA-C Previous Signature: 06/30/2019 6:25:26 PM Version By: Lenda Kelp PA-C Entered By: Lenda Kelp on 07/02/2019 14:47:12 [name] ([id number]) -------------------------------------------------------------------------------- ROS/PFSH Details Patient Name: [name] Date of Service: 06/28/2019 2:15 PM Medical Record Number: [id number] Patient Account Number: [id number] Date of Birth/Sex: 1930/03/21 (85 y.o. F) Treating RN: Rodell Perna Primary Care Provider: Aram Beecham Other Clinician: Referring Provider: Aram Beecham Treating Provider/Extender: Linwood Dibbles, HOYT Weeks in Treatment:  0 Information Obtained From Patient Ear/Nose/Mouth/Throat Complaints and Symptoms: Negative for: Difficult clearing ears; Sinusitis Medical History: Positive for: Chronic sinus problems/congestion; Middle ear problems Past Medical History Notes: bilateral hearing aides Hematologic/Lymphatic Complaints and Symptoms: Negative for: Bleeding / Clotting Disorders; Human Immunodeficiency Virus Respiratory Complaints and Symptoms: Negative for: Chronic or frequent coughs; Shortness of Breath Cardiovascular Complaints and Symptoms: Negative for: Chest pain; LE edema Gastrointestinal Complaints and Symptoms: Negative for: Frequent diarrhea; Nausea; Vomiting Endocrine Complaints and Symptoms: Negative for: Hepatitis; Thyroid disease; Polydypsia (Excessive Thirst) Genitourinary Complaints and Symptoms: Negative for: Kidney  failure/ Dialysis; Incontinence/dribbling Immunological Complaints and Symptoms: Negative for: Hives; Itching Integumentary (Skin) [name], [name] ([id number]) Complaints and Symptoms: Negative for: Wounds; Bleeding or bruising tendency; Breakdown; Swelling Musculoskeletal Complaints and Symptoms: Negative for: Muscle Pain; Muscle Weakness Neurologic Complaints and Symptoms: Negative for: Numbness/parasthesias; Focal/Weakness Psychiatric Complaints and Symptoms: Negative for: Anxiety; Claustrophobia Constitutional Symptoms (General Health) Medical History: Past Medical History Notes: Back pain Eyes Medical History: Positive for: Cataracts; Glaucoma; Optic Neuritis HBO Extended History Items Ear/Nose/Mouth/Throat: Ear/Nose/Mouth/Throat: Eyes: Eyes: Chronic sinus problems/congestion Middle ear problems Cataracts Glaucoma Immunizations Pneumococcal Vaccine: Received Pneumococcal Vaccination: No Implantable Devices Yes Family and Social History Cancer: No; Diabetes: No; Heart Disease: Yes - Siblings,Father; Hereditary Spherocytosis: No; Hypertension: No; Kidney Disease: No; Lung Disease: No; Seizures: No; Stroke: No; Thyroid Problems: No; Tuberculosis: No; Never smoker; Alcohol Use: Daily; Drug Use: No History; Caffeine Use: Never; Financial Concerns: No; Food, Clothing or Shelter Needs: No; Support System Lacking: No; Transportation Concerns: No Electronic Signature(s) Signed: 06/28/2019 4:56:53 PM By: Rodell Perna Signed: 06/30/2019 6:25:26 PM By: Lenda Kelp PA-C Entered By: Rodell Perna on 06/28/2019 14:32:28 [name] ([id number]) -------------------------------------------------------------------------------- SuperBill Details Patient Name: [name] Date of Service: 06/28/2019 Medical Record Number: [id number] Patient Account Number: [id number] Date of Birth/Sex: 26-Aug-1930 (85 y.o. F) Treating RN: Curtis Sites Primary Care Provider: Aram Beecham  Other Clinician: Referring Provider: Aram Beecham Treating Provider/Extender: Linwood Dibbles, HOYT Weeks in Treatment: 0 Diagnosis Coding ICD-10 Codes Code Description S81.801A Unspecified open wound, right lower leg, initial encounter L97.812 Non-pressure chronic ulcer of other part of right lower leg with fat layer exposed W54.1XXA Struck by dog, initial encounter Facility Procedures CPT4 Code: 08657846 Description: [id number] - WOUND CARE VISIT-LEV 4 EST PT Modifier: Quantity: 1 Physician Procedures CPT4 Code Description: 2841324 [id number] - WC PHYS LEVEL 4 - EST PT ICD-10 Diagnosis Description S81.801A Unspecified open wound, right lower leg, initial encounter L97.812 Non-pressure chronic ulcer of other part of right lower leg wi W54.1XXA Struck by  dog, initial encounter Modifier: th fat layer expo Quantity: 1 sed Electronic Signature(s) Signed: 06/30/2019 6:25:26 PM By: Lenda Kelp PA-C Previous Signature: 06/28/2019 5:37:40 PM Version By: Curtis Sites Entered By: Lenda Kelp on 06/30/2019 17:51:05

## 2019-07-02 ENCOUNTER — Ambulatory Visit: Payer: Medicare Other

## 2019-07-02 ENCOUNTER — Other Ambulatory Visit: Payer: Self-pay

## 2019-07-02 DIAGNOSIS — L97812 Non-pressure chronic ulcer of other part of right lower leg with fat layer exposed: Secondary | ICD-10-CM | POA: Diagnosis not present

## 2019-07-03 NOTE — Progress Notes (Signed)
IVONNA, KINNICK ([id number]) Visit Report for 07/02/2019 Arrival Information Details Patient Name: [name], [name]. Date of Service: 07/02/2019 1:45 PM Medical Record Number: [id number] Patient Account Number: [id number] Date of Birth/Sex: March 11, 1930 (85 y.o. F) Treating RN: Huel Coventry Primary Care Latish Toutant: Aram Beecham Other Clinician: Referring Myiah Petkus: Aram Beecham Treating Shequila Neglia/Extender: Linwood Dibbles, HOYT Weeks in Treatment: 0 Visit Information History Since Last Visit Added or deleted any medications: No Patient Arrived: Dan Humphreys Any new allergies or adverse reactions: No Arrival Time: 13:48 Had a fall or experienced change in No Accompanied By: self activities of daily living that may affect Transfer Assistance: None risk of falls: Patient Identification Verified: Yes Signs or symptoms of abuse/neglect since last visito No Secondary Verification Process Completed: Yes Hospitalized since last visit: No Implantable device outside of the clinic excluding No cellular tissue based products placed in the center since last visit: Has Dressing in Place as Prescribed: Yes Pain Present Now: No Electronic Signature(s) Signed: 07/02/2019 5:26:03 PM By: Elliot Gurney, BSN, RN, CWS, Kim RN, BSN Entered By: Elliot Gurney, BSN, RN, CWS, Kim on 07/02/2019 13:49:22 [name] ([id number]) -------------------------------------------------------------------------------- Clinic Level of Care Assessment Details Patient Name: [name] Date of Service: 07/02/2019 1:45 PM Medical Record Number: [id number] Patient Account Number: [id number] Date of Birth/Sex: 09/21/1930 (85 y.o. F) Treating RN: Huel Coventry Primary Care Viyan Rosamond: Aram Beecham Other Clinician: Referring Zamira Hickam: Aram Beecham Treating Tara Wich/Extender: Linwood Dibbles, HOYT Weeks in Treatment: 0 Clinic Level of Care Assessment Items TOOL 4 Quantity Score []  - Use when only an EandM is performed on FOLLOW-UP visit  0 ASSESSMENTS - Nursing Assessment / Reassessment []  - Reassessment of Co-morbidities (includes updates in patient status) 0 []  - 0 Reassessment of Adherence to Treatment Plan ASSESSMENTS - Wound and Skin Assessment / Reassessment X - Simple Wound Assessment / Reassessment - one wound 1 5 []  - 0 Complex Wound Assessment / Reassessment - multiple wounds []  - 0 Dermatologic / Skin Assessment (not related to wound area) ASSESSMENTS - Focused Assessment []  - Circumferential Edema Measurements - multi extremities 0 []  - 0 Nutritional Assessment / Counseling / Intervention []  - 0 Lower Extremity Assessment (monofilament, tuning fork, pulses) []  - 0 Peripheral Arterial Disease Assessment (using hand held doppler) ASSESSMENTS - Ostomy and/or Continence Assessment and Care []  - Incontinence Assessment and Management 0 []  - 0 Ostomy Care Assessment and Management (repouching, etc.) PROCESS - Coordination of Care X - Simple Patient / Family Education for ongoing care 1 15 []  - 0 Complex (extensive) Patient / Family Education for ongoing care []  - 0 Staff obtains , Records, Test Results / Process Orders []  - 0 Staff telephones HHA, Nursing Homes / Clarify orders / etc []  - 0 Routine Transfer to another Facility (non-emergent condition) []  - 0 Routine Hospital Admission (non-emergent condition) []  - 0 New Admissions / / Ordering NPWT, Apligraf, etc. []  - 0 Emergency Hospital Admission (emergent condition) X- 1 10 Simple Discharge Coordination OLAMIDE, LAHAIE ( ) []  - 0 Complex (extensive) Discharge Coordination PROCESS - Special Needs []  - Pediatric / Minor Patient Management 0 []  - 0 Isolation Patient Management []  - 0 Hearing / Language / Visual special needs []  - 0 Assessment of Community assistance (transportation, D/C planning, etc.) []  - 0 Additional assistance / Altered mentation []  - 0 Support Surface(s) Assessment (bed,  cushion, seat, etc.) INTERVENTIONS - Wound Cleansing / Measurement X - Simple Wound Cleansing - one wound 1 5 []  - 0 Complex Wound Cleansing -  multiple wounds X- 1 5 Wound Imaging (photographs - any number of wounds) []  - 0 Wound Tracing (instead of photographs) X- 1 5 Simple Wound Measurement - one wound []  - 0 Complex Wound Measurement - multiple wounds INTERVENTIONS - Wound Dressings []  - Small Wound Dressing one or multiple wounds 0 X- 1 15 Medium Wound Dressing one or multiple wounds []  - 0 Large Wound Dressing one or multiple wounds []  - 0 Application of Medications - topical []  - 0 Application of Medications - injection INTERVENTIONS - Miscellaneous []  - External ear exam 0 []  - 0 Specimen Collection (cultures, biopsies, blood, body fluids, etc.) []  - 0 Specimen(s) / Culture(s) sent or taken to Lab for analysis []  - 0 Patient Transfer (multiple staff / Civil Service fast streamer / Similar devices) []  - 0 Simple Staple / Suture removal (25 or less) []  - 0 Complex Staple / Suture removal (26 or more) []  - 0 Hypo / Hyperglycemic Management (close monitor of Blood Glucose) []  - 0 Ankle / Brachial Index (ABI) - do not check if billed separately X- 1 5 Vital Signs LIYANNA, CARTWRIGHT ([id number]) Has the patient been seen at the hospital within the last three years: Yes Total Score: 65 Level Of Care: New/Established - Level 2 Electronic Signature(s) Signed: 07/02/2019 5:26:03 PM By: Gretta Cool, BSN, RN, CWS, Kim RN, BSN Entered By: Gretta Cool, BSN, RN, CWS, Kim on 07/02/2019 14:04:41 [name] ([id number]) -------------------------------------------------------------------------------- Encounter Discharge Information Details Patient Name: [name] Date of Service: 07/02/2019 1:45 PM Medical Record Number: [id number] Patient Account Number: [id number] Date of Birth/Sex: July 20, 1930 (85 y.o. F) Treating RN: Cornell Barman Primary Care Isaid Salvia: Fulton Reek Other  Clinician: Referring Kaelan Emami: Fulton Reek Treating Kyla Duffy/Extender: Melburn Hake, HOYT Weeks in Treatment: 0 Encounter Discharge Information Items Discharge Condition: Stable Ambulatory Status: Walker Discharge Destination: Home Transportation: Private Auto Accompanied By: self Schedule Follow-up Appointment: Yes Clinical Summary of Care: Electronic Signature(s) Signed: 07/02/2019 5:26:03 PM By: Gretta Cool, BSN, RN, CWS, Kim RN, BSN Entered By: Gretta Cool, BSN, RN, CWS, Kim on 07/02/2019 14:04:10 [name] ([id number]) -------------------------------------------------------------------------------- Wound Assessment Details Patient Name: [name] Date of Service: 07/02/2019 1:45 PM Medical Record Number: [id number] Patient Account Number: [id number] Date of Birth/Sex: 09/24/1930 (85 y.o. F) Treating RN: Cornell Barman Primary Care Pearlee Arvizu: Fulton Reek Other Clinician: Referring Lani Havlik: Fulton Reek Treating Fitzroy Mikami/Extender: Melburn Hake, HOYT Weeks in Treatment: 0 Wound Status Wound Number: 11 Primary Skin Tear Etiology: Wound Location: Right Lower Leg - Anterior Wound Open Wounding Event: Trauma Status: Date Acquired: 06/27/2019 Comorbid Cataracts, Glaucoma, Optic Neuritis, Chronic Weeks Of Treatment: 0 History: sinus problems/congestion, Middle ear Clustered Wound: No problems Photos Wound Measurements Length: (cm) 4 % Reduction Width: (cm) 3.4 % Reduction Depth: (cm) 0.5 Epithelializ Area: (cm) 10.681 Volume: (cm) 5.341 in Area: 0% in Volume: 0% ation: None Wound Description Classification: Partial Thickness Foul Odor A Exudate Amount: Medium Slough/Fibr Exudate Type: Sanguinous Exudate Color: red fter Cleansing: No ino Yes Wound Bed Granulation Amount: Large (67-100%) Exposed Structure Granulation Quality: Red Fascia Exposed: No Necrotic Amount: Small (1-33%) Fat Layer (Subcutaneous Tissue) Exposed: Yes Necrotic Quality: Eschar, Adherent  Slough Tendon Exposed: No Muscle Exposed: No Joint Exposed: No Bone Exposed: No Treatment Notes Wound #11 (Right, Anterior Lower Leg) JENESSA, GILLINGHAM ([id number]) Notes xeroform, non adherent pad, conform and netting Electronic Signature(s) Signed: 07/02/2019 5:26:03 PM By: Gretta Cool, BSN, RN, CWS, Kim RN, BSN Entered By: Gretta Cool, BSN, RN, CWS, Kim on 07/02/2019 13:50:11

## 2019-07-04 ENCOUNTER — Ambulatory Visit: Payer: Medicare Other

## 2019-07-05 ENCOUNTER — Other Ambulatory Visit: Payer: Self-pay

## 2019-07-05 ENCOUNTER — Other Ambulatory Visit
Admission: RE | Admit: 2019-07-05 | Discharge: 2019-07-05 | Disposition: A | Discharge status: Home / Self Care | Payer: Medicare Other | Source: Ambulatory Visit | Attending: Physician Assistant | Admitting: Physician Assistant

## 2019-07-05 ENCOUNTER — Encounter: Payer: Medicare Other | Admitting: Physician Assistant

## 2019-07-05 DIAGNOSIS — L089 Local infection of the skin and subcutaneous tissue, unspecified: Secondary | ICD-10-CM | POA: Insufficient documentation

## 2019-07-05 DIAGNOSIS — L97812 Non-pressure chronic ulcer of other part of right lower leg with fat layer exposed: Secondary | ICD-10-CM | POA: Diagnosis not present

## 2019-07-05 NOTE — Progress Notes (Addendum)
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 foam border and a Tubigrip. Actually do not think she has anything on the arm at all specifically she is not using her brace 5/6; she has been using medihoney without a lot of improvement. Undermining maximum at 9:00 at 0.5 cm may be somewhat better. She is still complaining of discomfort. She uses her elbow brace at night but is not using anything on the arm during the day, she finds it too restrictive 5/13; we switch the patient to endoform AG last week. She is complaining of more pain and worried about some circumferential erythema around the wound. I had planned to consider epifix in this wound however  the patient came in with a request to see a plastic surgeon in [place] by the name of Dr. Wyline Mood who cared for a friend of hers. Noteworthy that I have already sent her to one plastic surgeon and she went for another second opinion at [facility] and they apparently did not send her to a plastic surgeon nevertheless the patient is fairly convinced that she might benefit from a skin graft which I am doubtful. She also has rheumatoid arthritis and is on methotrexate. This was originally a surgical wound for a bursectomy a year or 2 ago Readmission: 06/28/19 on evaluation today patient presents today for reevaluation but this is due to a new issue her elbow has completely healed and looks excellent. Her right anterior lower leg has a skin tear which was sustained from her dog who jumped up on her and inadvertently scratch the area causing the skin tear. This happened yesterday. She is having some discomfort but fortunately nothing too significant which is good news. No fevers, chills, nausea, or vomiting noted at this time. The skin fortunately was knocked one completely off and we are gonna see about we approximate in the skin as best we can in using Steri-Strips to hold this in place obviously if we can get some of this to reattach that would be beneficial [name], [name]. ([id number]) 07/05/2019 on evaluation today patient actually appears to be doing okay although unfortunately the skin flap that we were attempting to Steri-Stripped down last week did not take. She is developing a lot of fluid underneath the wound area unfortunately which again is not ideal. I think that this necrotic tissue needs to be removed and again it actually just wiped off during the evaluation today as I was attempting to clean the wound there did not appear to be any significant issues underlying which is good although there was some purulent drainage  I did want to go ahead and see about obtaining a culture from today in  order to ensure that the Z-Pak that I placed her on earlier in the week was appropriate for treating what ever infection may be causing the issue currently. This was a deep wound culture obtained today. Patient History Information obtained from Patient. Family History Heart Disease - Siblings,Father, No family history of Cancer, Diabetes, Hereditary Spherocytosis, Hypertension, Kidney Disease, Lung Disease, Seizures, Stroke, Thyroid Problems, Tuberculosis. Social History Never smoker, Alcohol Use - Daily, Drug Use - No History, Caffeine Use - Never. Medical History Eyes Patient has history of Cataracts, Glaucoma, Optic Neuritis Ear/Nose/Mouth/Throat Patient has history of Chronic sinus problems/congestion, Middle ear problems Medical And Surgical History Notes Constitutional Symptoms (General Health) Back pain Ear/Nose/Mouth/Throat bilateral hearing aides Review of Systems (ROS) Constitutional Symptoms (General Health) Denies complaints or symptoms of Fatigue, Fever, Chills, Marked Weight Change. Respiratory Denies complaints or symptoms of Chronic or frequent coughs, Shortness of Breath. Cardiovascular Denies complaints or symptoms of Chest pain, LE edema. Psychiatric Denies complaints or symptoms of Anxiety, Claustrophobia. Objective Constitutional Well-nourished and well-hydrated in no acute distress. Vitals Time Taken: 3:03 PM, Height: 60 in, Weight: 125 lbs, BMI: 24.4, Temperature: 98.2 F, Pulse: 72 bpm, Respiratory Rate: 16 breaths/min, Blood Pressure: 124/38 mmHg. NUMA, HEATWOLE VMarland Kitchen ([id number]) Respiratory normal breathing without difficulty. Psychiatric this patient is able to make decisions and demonstrates good insight into disease process. Alert and Oriented x 3. pleasant and cooperative. General Notes: Patient's wound bed currently again did have necrotic tissue that pretty much is wiped off there is further necrotic tissue noted but she is very tender to touch I  think this is due partially due to infection and partially due to the fact that this is obviously just an open wound. Patient's wound currently otherwise appears to be doing okay. I did obtain a wound culture once the necrotic tissue was removed of the deep wound section. Post debridement the wound bed appears to be doing significantly better which is excellent. With that being said there is still further necrotic tissue that is going to have to work itself off over the next several weeks for now though due to her pain and obvious signs of infection I did not want to be too aggressive in this. Integumentary (Hair, Skin) Wound #11 status is Open. Original cause of wound was Trauma. The wound is located on the Right,Anterior Lower Leg. The wound measures 4.5cm length x 3.4cm width x 0.5cm depth; 12.017cm^2 area and 6.008cm^3 volume. There is Fat Layer (Subcutaneous Tissue) Exposed exposed. There is no tunneling or undermining noted. There is a medium amount of sanguinous drainage noted. The wound margin is flat and intact. There is large (67-100%) red granulation within the wound bed. There is a small (1-33%) amount of necrotic tissue within the wound bed including Eschar and Adherent Slough. Assessment Active Problems ICD-10 Unspecified open wound, right lower leg, initial encounter Non-pressure chronic ulcer of other part of right lower leg with fat layer exposed Struck by dog, initial encounter Plan Wound Cleansing: Wound #11 Right,Anterior Lower Leg: Clean wound with Normal Saline. Primary Wound Dressing: Wound #11 Right,Anterior Lower Leg: Silver Alginate Secondary Dressing: Wound #11 Right,Anterior Lower Leg: Boardered Foam Dressing Dressing Change Frequency: Wound #11 Right,Anterior Lower Leg: Other: - Tuesdays and Fridays Follow-up Appointments: Wound #11 Right,Anterior Lower Leg: Return Appointment in 1 week. DAMIAN, HOFSTRA VMarland Kitchen ([id number]) Edema Control: Wound #11  Right,Anterior Lower Leg: Other: - TubiGrip F Laboratory ordered were:  Wound culture routine My suggestion currently is good to be that we continue with the above wound care measures for the next week and the patient is in agreement with the plan. We will subsequently see with things stand at follow-up. If anything changes or worsens in the meantime patient will contact the office and let me know. Otherwise we will see where things stand at follow-up. We will see what the results of the culture show and make any adjustments as necessary going forward with regard to the antibiotic therapy. Electronic Signature(s) Signed: 07/05/2019 3:43:34 PM By: Worthy Keeler PA-C Entered By: Worthy Keeler on 07/05/2019 15:43:34 [name] ([id number]) -------------------------------------------------------------------------------- ROS/PFSH Details Patient Name: [name] Date of Service: 07/05/2019 3:00 PM Medical Record Number: [id number] Patient Account Number: [id number] Date of Birth/Sex: [birth year] (85 y.o. F) Treating RN: Montey Hora Primary Care Provider: Fulton Reek Other Clinician: Referring Provider: Fulton Reek Treating Provider/Extender: Melburn Hake, Jaques Mineer Weeks in Treatment: 1 Information Obtained From Patient Constitutional Symptoms (General Health) Complaints and Symptoms: Negative for: Fatigue; Fever; Chills; Marked Weight Change Medical History: Past Medical History Notes: Back pain Respiratory Complaints and Symptoms: Negative for: Chronic or frequent coughs; Shortness of Breath Cardiovascular Complaints and Symptoms: Negative for: Chest pain; LE edema Psychiatric Complaints and Symptoms: Negative for: Anxiety; Claustrophobia Eyes Medical History: Positive for: Cataracts; Glaucoma; Optic Neuritis Ear/Nose/Mouth/Throat Medical History: Positive for: Chronic sinus problems/congestion; Middle ear problems Past Medical History Notes: bilateral hearing  aides HBO Extended History Items Ear/Nose/Mouth/Throat: Eyes: Eyes: Ear/Nose/Mouth/Throat: Chronic sinus Cataracts Glaucoma Middle ear problems problems/congestion Immunizations Pneumococcal Vaccine: Received Pneumococcal Vaccination: No Implantable Devices TYSHANA, NISHIDA ([id number]) Yes Family and Social History Cancer: No; Diabetes: No; Heart Disease: Yes - Siblings,Father; Hereditary Spherocytosis: No; Hypertension: No; Kidney Disease: No; Lung Disease: No; Seizures: No; Stroke: No; Thyroid Problems: No; Tuberculosis: No; Never smoker; Alcohol Use: Daily; Drug Use: No History; Caffeine Use: Never; Financial Concerns: No; Food, Clothing or Shelter Needs: No; Support System Lacking: No; Transportation Concerns: No Physician Affirmation I have reviewed and agree with the above information. Electronic Signature(s) Signed: 07/05/2019 4:09:36 PM By: Montey Hora Signed: 07/05/2019 4:51:26 PM By: Worthy Keeler PA-C Entered By: Worthy Keeler on 07/05/2019 15:41:47 [name] ([id number]) -------------------------------------------------------------------------------- SuperBill Details Patient Name: [name] Date of Service: 07/05/2019 Medical Record Number: [id number] Patient Account Number: [id number] Date of Birth/Sex: [birth year] (85 y.o. F) Treating RN: Montey Hora Primary Care Provider: Fulton Reek Other Clinician: Referring Provider: Fulton Reek Treating Provider/Extender: Melburn Hake, Shyenne Maggard Weeks in Treatment: 1 Diagnosis Coding ICD-10 Codes Code Description S81.801A Unspecified open wound, right lower leg, initial encounter L97.812 Non-pressure chronic ulcer of other part of right lower leg with fat layer exposed W54.1XXA Struck by dog, initial encounter Facility Procedures CPT4 Code: 16073710 Description: [phone] - WOUND CARE VISIT-LEV 3 EST PT Modifier: Quantity: 1 Physician Procedures CPT4 Code Description: [id number] - WC PHYS LEVEL 4 -  EST PT ICD-10 Diagnosis Description S81.801A Unspecified open wound, right lower leg, initial encounter L97.812 Non-pressure chronic ulcer of other part of right lower leg wi W54.1XXA Struck by  dog, initial encounter Modifier: th fat layer expo Quantity: 1 sed Electronic Signature(s) Signed: 07/05/2019 3:43:47 PM By: Worthy Keeler PA-C Previous Signature: 07/05/2019 3:38:45 PM Version By: Montey Hora Entered By: Worthy Keeler on 07/05/2019 15:43:47

## 2019-07-09 ENCOUNTER — Other Ambulatory Visit: Payer: Self-pay

## 2019-07-09 ENCOUNTER — Ambulatory Visit: Payer: Medicare Other

## 2019-07-09 DIAGNOSIS — R262 Difficulty in walking, not elsewhere classified: Secondary | ICD-10-CM

## 2019-07-09 DIAGNOSIS — G8929 Other chronic pain: Secondary | ICD-10-CM

## 2019-07-09 DIAGNOSIS — R2681 Unsteadiness on feet: Secondary | ICD-10-CM

## 2019-07-09 DIAGNOSIS — M6281 Muscle weakness (generalized): Secondary | ICD-10-CM

## 2019-07-09 DIAGNOSIS — Z9181 History of falling: Secondary | ICD-10-CM

## 2019-07-09 NOTE — Therapy (Signed)
[place] [facility] PH[facility] AND [facility] MEDICINE [address] [place], [place], [address] Phone: [phone]   Fax:  [phone]  Physical Therapy Treatment  Patient Details  Name: [name] MRN: [id number] Date of Birth: 08/04/1930 Referring Provider (PT): Cristopher PeruHemang Shah, MD   Encounter Date: 07/09/2019  PT End of Session - 07/09/19 1400    Visit Number  [id number]    Number of Visits  94    Date for PT Re-Evaluation  08/08/19    Authorization Type  4    Authorization Time Period  10    PT Start Time  1400    PT Stop Time  1442    PT Time Calculation (min)  42 min    Activity Tolerance  Patient tolerated treatment well;Patient limited by fatigue    Behavior During Therapy  [facility] for tasks assessed/performed       Past Medical History:  Diagnosis Date  . Anemia   . GERD (gastroesophageal reflux disease)   . Hypertension   . Peripheral vascular disease (HCC)    possible neuropathies in lower extremeties  . PONV (postoperative nausea and vomiting)    happens sometimes but better with pre med of zofran  . Presence of permanent cardiac pacemaker   . Syncope     Past Surgical History:  Procedure Laterality Date  . ABDOMINAL HYSTERECTOMY  1969  . APPENDECTOMY  1969   with hysterectomy  . BACK SURGERY  2001   rods in back. surgery on back x 3  . COLONOSCOPY    . I&D EXTREMITY Left 03/27/2018   Procedure: IRRIGATION AND DEBRIDEMENT LEFT ELBOW / OLECRANON BURSA;  Surgeon: Christena FlakePoggi, John J, MD;  Location: [facility] ORS;  Service: Orthopedics;  Laterality: Left;  . I&D EXTREMITY Left 06/05/2018   Procedure: IRRIGATION AND DEBRIDEMENT EXTREMITY;  Surgeon: Christena FlakePoggi, John J, MD;  Location: [facility] ORS;  Service: Orthopedics;  Laterality: Left;  . INSERT / REPLACE / REMOVE PACEMAKER  2004  . JOINT REPLACEMENT Left 2004   partial knee replacement  . OLECRANON BURSECTOMY Left 03/27/2018   Procedure: LEFT OLECRANON BURSA;  Surgeon: Christena FlakePoggi, John J, MD;  Location: [facility] ORS;   Service: Orthopedics;  Laterality: Left;  . PACEMAKER INSERTION  2014   dual chamber for complete heart block    There were no vitals filed for this visit.  Subjective Assessment - 07/09/19 1402    Subjective  Pt states that her dog accidentally cut her R leg with her toe nail almost 2 weeks ago. Pt cleaned her wound. Called the wound center as well.  L elbow is leaking a little.  R leg discomfort with walking.    Pertinent History  Imparied gait. Pt states that she has had balance issues for too long. Feels more comfortable walking with her rw. Had tests which showed that she has neuropathy. [facility] is coming from her back. Just wants to be strong so she can walk.  Started walking with her rw off and on for the past several months.  Also feels like she might need a new sleep number bed. Also had 2 wound surgeries at [name]. Pt fell in February 2019. Had to stop PT due to the L elbow surgery April 06, 2018 to remove a bursa. The wound still has not healed.   Currently sees a wound doctor.   Feels numbness L foot from the ankle down.  No R foot numbness.  Sometimes unsure of where her L LE is in space.  Pt also states that she tires too quickly.  Has not been able to walk her dog in 2 years. Has not really been doing exercises at home except the sit <> stand. Pt states that prolonged sitting without back support will increase back pain     Currently in Pain?  Other (Comment)   no complain of pain                              PT Education - 07/09/19 1409    Education provided  Yes    Education Details  Ther-ex    Person(s) Educated  Patient    Methods  Explanation;Demonstration;Tactile cues;Verbal cues    Comprehension  Returned demonstration;Verbalized understanding      OBJECTIVE  Pt ambulating with rw   No latex band allergies    MedbridgeAccess Code: [email or web address]   Pt has pacemaker  Wound  with dressing R tibialis anterior muscle.   Therapeutic exercise:   seated trunk extension isometrics at neutral, manually resisted 10x10secondsfor2sets   SLS with occasional light touch assist  R 5x5 seconds for 2 sets L 5x5 seconds for 2 sets  Standing hip abduction with B UE assist              R 10x2 with 5 second holds              L 10x2 with 5 second holds   No resistance secondary to wound in leg  Gait with SPC CGA 50 ft. Pt seams very fearful of falling.   forward step up onto 4 inch with contralateral UE assist  R 10x2  L 10x2  Gait with SPC CGA 100 ft. Better able to perform compared to first gait attempt. Still demonstrates fear of falling but not as much as first attempt.   Standing mini squats without UE assist 10x    Improved exercise technique, movement at target joints, use of target muscles after min to mod verbal, visual, tactile cues.   Patient response to treatment: Good muscle use felt with exercises. Some R anterior lateral leg discomfort with gait. No complain of increased back pain.    Clinical impression  Continued working on LE strengthening as tolerated for R leg to promote ability to perform standing tasks as well as walking. Difficulty with gait with SPC initially secondary to fear of falling. Better able to ambulate with SPC and CGA during second attempt. Some discomfort on R leg with gait secondary to wound. Pt will benefit from continued skilled physical therapy services to improve LE strength, balance, function, and decrease difficulty ambulating.        PT Short Term Goals - 04/25/19 1723      PT SHORT TERM GOAL #1   Title  Patient will be independent with her HEP to improve strength and balance.     Time  3    Period  Weeks    Status  On-going    Target Date  05/08/19        PT Long Term Goals - 06/11/19 1547      PT LONG TERM GOAL #1   Title  Patient will improve her 10 MWT  to 0.8 m/s or more with rw to promote better community ambulation.     Baseline  0.5 seconds with rw (09/06/2018); 0.63 seconds average with rw (10/18/2018); 0.69 m/s average using rw (  12/10/2018); 0.59 m/s using SPC and CGA (01/08/2019); 0.60 m/s using SPC (01/21/2019); 0.56 m/s with SPC to no AD assist, CGA (02/04/2019); 0.45 m/s average (04/17/2019); 0.53 m/s with SPC (06/06/2019)    Time  8    Period  Weeks    Status  On-going    Target Date  08/08/19      PT LONG TERM GOAL #2   Title  Patient will improve her DGI score using rw or least restrictive AD to 19/24 or more to promote balance.     Baseline  13/24 using rw (09/06/2018); 14/24 using rw (10/29/2018); 16/24 (12/10/2018); 14/24 using SPC (01/21/2019); 9/24 using SPC (04/17/2019); 14/24 (06/11/2019)    Time  8    Period  Weeks    Status  On-going    Target Date  08/08/19      PT LONG TERM GOAL #3   Title  Patient will improve bilateral LE strength by at least 1/2 MMT to promote ability to support herself when performing standing tasks and improve balance.     Time  8    Period  Weeks    Status  On-going    Target Date  08/08/19      PT LONG TERM GOAL #4   Title  Patient will have a decrease in back pain/ache to 5/10 or less to promote ability to perform standing tasks with less difficulty.    Baseline  8/10 at most for the past 3 months (09/06/2018); 5-6/10 at worst for the past 7 days (10/18/2018); 5/10 at worst for the past 7 days (12/10/2018)    Time  6    Period  Weeks    Status  Achieved            Plan - 07/09/19 1429    Clinical Impression Statement  Continued working on LE strengthening as tolerated for R leg to promote ability to perform standing tasks as well as walking. Difficulty with gait with SPC initially secondary to fear of falling. Better able to ambulate with SPC and CGA during second attempt. Some discomfort on R leg with gait secondary to wound. Pt will benefit from continued skilled physical therapy services to  improve LE strength, balance, function, and decrease difficulty ambulating.    Rehab Potential  Fair    PT Frequency  2x / week    PT Duration  8 weeks    PT Treatment/Interventions  Aquatic Therapy;Gait training;Therapeutic activities;Therapeutic exercise;Balance training;Neuromuscular re-education;Patient/family education;Manual techniques;Dry needling    PT Next Visit Plan  trunk, hip, scapular strengthening, balance, gait, manual techniques PRN    PT Home Exercise Plan  Sit<>stand, standing hip ext and abd, bridging, and heel raises    Consulted and Agree with Plan of Care  Patient       Patient will benefit from skilled therapeutic intervention in order to improve the following deficits and impairments:  Abnormal gait, Decreased activity tolerance, Decreased balance, Decreased endurance, Decreased range of motion, Decreased strength, Difficulty walking, Improper body mechanics, Postural dysfunction, Pain, Decreased knowledge of use of DME  Visit Diagnosis: 1. Muscle weakness (generalized)   2. Difficulty in walking, not elsewhere classified   3. History of falling   4. Unsteadiness on feet   5. Chronic low back pain, unspecified back pain laterality, unspecified whether sciatica present        Problem List Patient Active Problem List   Diagnosis Date Noted  . Septic olecranon bursitis of left elbow 03/27/2018  . Lymphedema 10/11/2017  .  Chronic venous insufficiency 10/11/2017  . Leg pain 10/11/2017  . Leg swelling 10/11/2017  . COPD (chronic obstructive pulmonary disease) (HCC) 10/11/2017  . Essential hypertension 10/11/2017    Loralyn Freshwater PT, DPT   07/09/2019, 5:44 PM  [address] [facility] PHYSICAL AND SPORTS MEDICINE [address] [place], [address] Phone: [phone]   Fax:  [phone]  Name: [name] MRN: [id number] Date of Birth: December 28, 1929

## 2019-07-09 NOTE — Progress Notes (Signed)
FINNLEY, CANION ([id number]) Visit Report for 07/05/2019 Arrival Information Details Patient Name: [name], [name]. Date of Service: 07/05/2019 3:00 PM Medical Record Number: [id number] Patient Account Number: [id number] Date of Birth/Sex: 01/31/1930 (85 y.o. F) Treating RN: Rodell Perna Primary Care Charli Halle: Aram Beecham Other Clinician: Referring Umaiza Matusik: Aram Beecham Treating Kanae Ignatowski/Extender: Linwood Dibbles, HOYT Weeks in Treatment: 1 Visit Information History Since Last Visit Added or deleted any medications: No Patient Arrived: Walker Any new allergies or adverse reactions: No Arrival Time: 15:04 Had a fall or experienced change in No Accompanied By: self activities of daily living that may affect Transfer Assistance: None risk of falls: Signs or symptoms of abuse/neglect since last visito No Hospitalized since last visit: No Has Dressing in Place as Prescribed: Yes Pain Present Now: Yes Electronic Signature(s) Signed: 07/05/2019 3:54:07 PM By: Rodell Perna Entered By: Rodell Perna on 07/05/2019 15:05:00 [name] ([id number]) -------------------------------------------------------------------------------- Clinic Level of Care Assessment Details Patient Name: [name] Date of Service: 07/05/2019 3:00 PM Medical Record Number: [id number] Patient Account Number: [id number] Date of Birth/Sex: February 29, 1930 (85 y.o. F) Treating RN: Curtis Sites Primary Care Bobbie Virden: Aram Beecham Other Clinician: Referring Krishon Adkison: Aram Beecham Treating Louna Rothgeb/Extender: Linwood Dibbles, HOYT Weeks in Treatment: 1 Clinic Level of Care Assessment Items TOOL 4 Quantity Score []  - Use when only an EandM is performed on FOLLOW-UP visit 0 ASSESSMENTS - Nursing Assessment / Reassessment X - Reassessment of Co-morbidities (includes updates in patient status) 1 10 X- 1 5 Reassessment of Adherence to Treatment Plan ASSESSMENTS - Wound and Skin Assessment / Reassessment X - Simple  Wound Assessment / Reassessment - one wound 1 5 []  - 0 Complex Wound Assessment / Reassessment - multiple wounds []  - 0 Dermatologic / Skin Assessment (not related to wound area) ASSESSMENTS - Focused Assessment []  - Circumferential Edema Measurements - multi extremities 0 []  - 0 Nutritional Assessment / Counseling / Intervention X- 1 5 Lower Extremity Assessment (monofilament, tuning fork, pulses) []  - 0 Peripheral Arterial Disease Assessment (using hand held doppler) ASSESSMENTS - Ostomy and/or Continence Assessment and Care []  - Incontinence Assessment and Management 0 []  - 0 Ostomy Care Assessment and Management (repouching, etc.) PROCESS - Coordination of Care X - Simple Patient / Family Education for ongoing care 1 15 []  - 0 Complex (extensive) Patient / Family Education for ongoing care X- 1 10 Staff obtains Chiropractor, Records, Test Results / Process Orders []  - 0 Staff telephones HHA, Nursing Homes / Clarify orders / etc []  - 0 Routine Transfer to another Facility (non-emergent condition) []  - 0 Routine Hospital Admission (non-emergent condition) []  - 0 New Admissions / Manufacturing engineer / Ordering NPWT, Apligraf, etc. []  - 0 Emergency Hospital Admission (emergent condition) X- 1 10 Simple Discharge Coordination JOYLYN, BEZIO ([id number]) []  - 0 Complex (extensive) Discharge Coordination PROCESS - Special Needs []  - Pediatric / Minor Patient Management 0 []  - 0 Isolation Patient Management []  - 0 Hearing / Language / Visual special needs []  - 0 Assessment of Community assistance (transportation, D/C planning, etc.) []  - 0 Additional assistance / Altered mentation []  - 0 Support Surface(s) Assessment (bed, cushion, seat, etc.) INTERVENTIONS - Wound Cleansing / Measurement X - Simple Wound Cleansing - one wound 1 5 []  - 0 Complex Wound Cleansing - multiple wounds X- 1 5 Wound Imaging (photographs - any number of wounds) []  - 0 Wound Tracing  (instead of photographs) X- 1 5 Simple Wound Measurement - one wound []  - 0 Complex Wound  Measurement - multiple wounds INTERVENTIONS - Wound Dressings X - Small Wound Dressing one or multiple wounds 1 10 []  - 0 Medium Wound Dressing one or multiple wounds []  - 0 Large Wound Dressing one or multiple wounds []  - 0 Application of Medications - topical []  - 0 Application of Medications - injection INTERVENTIONS - Miscellaneous []  - External ear exam 0 X- 1 5 Specimen Collection (cultures, biopsies, blood, body fluids, etc.) X- 1 5 Specimen(s) / Culture(s) sent or taken to Lab for analysis []  - 0 Patient Transfer (multiple staff / Civil Service fast streamer / Similar devices) []  - 0 Simple Staple / Suture removal (25 or less) []  - 0 Complex Staple / Suture removal (26 or more) []  - 0 Hypo / Hyperglycemic Management (close monitor of Blood Glucose) []  - 0 Ankle / Brachial Index (ABI) - do not check if billed separately X- 1 5 Vital Signs [name], [name] ([id number]) Has the patient been seen at the hospital within the last three years: Yes Total Score: 100 Level Of Care: ____ Electronic Signature(s) Signed: 07/05/2019 4:09:36 PM By: Montey Hora Entered By: Montey Hora on 07/05/2019 15:38:36 [name] ([id number]) -------------------------------------------------------------------------------- Encounter Discharge Information Details Patient Name: [name] Date of Service: 07/05/2019 3:00 PM Medical Record Number: [id number] Patient Account Number: [id number] Date of Birth/Sex: [birth year] (85 y.o. F) Treating RN: Cornell Barman Primary Care Dyllon Henken: Fulton Reek Other Clinician: Referring Greenley Martone: Fulton Reek Treating Ainslee Sou/Extender: Melburn Hake, HOYT Weeks in Treatment: 1 Encounter Discharge Information Items Discharge Condition: Stable Ambulatory Status: Walker Discharge Destination: Home Transportation: Private Auto Accompanied By: self Schedule Follow-up  Appointment: Yes Clinical Summary of Care: Electronic Signature(s) Signed: 07/09/2019 11:55:38 AM By: Gretta Cool, BSN, RN, CWS, Kim RN, BSN Entered By: Gretta Cool, BSN, RN, CWS, Kim on 07/05/2019 15:38:11 [name] ([id number]) -------------------------------------------------------------------------------- Lower Extremity Assessment Details Patient Name: [name], [name] Date of Service: 07/05/2019 3:00 PM Medical Record Number: [id number] Patient Account Number: [id number] Date of Birth/Sex: 11/01/1930 (85 y.o. F) Treating RN: Army Melia Primary Care Mekai Wilkinson: Fulton Reek Other Clinician: Referring Anntionette Madkins: Fulton Reek Treating Darly Massi/Extender: Melburn Hake, HOYT Weeks in Treatment: 1 Vascular Assessment Pulses: Dorsalis Pedis Palpable: [Right:Yes] Posterior Tibial Palpable: [Right:Yes] Electronic Signature(s) Signed: 07/05/2019 3:54:07 PM By: Army Melia Entered By: Army Melia on 07/05/2019 15:11:39 [name] ([id number]) -------------------------------------------------------------------------------- Multi Wound Chart Details Patient Name: [name] Date of Service: 07/05/2019 3:00 PM Medical Record Number: [id number] Patient Account Number: [id number] Date of Birth/Sex: 03-Feb-1930 (85 y.o. F) Treating RN: Montey Hora Primary Care Mayla Biddy: Fulton Reek Other Clinician: Referring Scheryl Sanborn: Fulton Reek Treating Jamion Carter/Extender: Melburn Hake, HOYT Weeks in Treatment: 1 Vital Signs Height(in): 60 Pulse(bpm): 72 Weight(lbs): 125 Blood Pressure(mmHg): 124/38 Body Mass Index(BMI): 24 Temperature(F): 98.2 Respiratory Rate 16 (breaths/min): Photos: [N/A:N/A] Wound Location: Right Lower Leg - Anterior N/A N/A Wounding Event: Trauma N/A N/A Primary Etiology: Skin Tear N/A N/A Comorbid History: Cataracts, Glaucoma, Optic N/A N/A Neuritis, Chronic sinus problems/congestion, Middle ear problems Date Acquired: 06/27/2019 N/A N/A Weeks of  Treatment: 1 N/A N/A Wound Status: Open N/A N/A Measurements L x W x D 4.5x3.4x0.5 N/A N/A (cm) Area (cm) : 12.017 N/A N/A Volume (cm) : 6.008 N/A N/A % Reduction in Area: -12.50% N/A N/A % Reduction in Volume: -12.50% N/A N/A Classification: Partial Thickness N/A N/A Exudate Amount: Medium N/A N/A Exudate Type: Sanguinous N/A N/A Exudate Color: red N/A N/A Wound Margin: Flat and Intact N/A N/A Granulation Amount: Large (67-100%) N/A N/A Granulation Quality: Red N/A N/A  Necrotic Amount: Small (1-33%) N/A N/A Necrotic Tissue: Eschar, Adherent Slough N/A N/A Exposed Structures: Fat Layer (Subcutaneous N/A N/A Tissue) Exposed: Yes Fascia: No Tendon: No Natasha BassetHENRY, [name] ([id number]) Muscle: No Joint: No Bone: No Epithelialization: None N/A N/A Treatment Notes Electronic Signature(s) Signed: 07/05/2019 4:09:36 PM By: Curtis Sitesorthy, Joanna Entered By: Curtis Sitesorthy, Joanna on 07/05/2019 15:19:02 Natasha BassetHENRY, [name] ([id number]) -------------------------------------------------------------------------------- Multi-Disciplinary Care Plan Details Patient Name: Natasha BassetHENRY, Demitra V. Date of Service: 07/05/2019 3:00 PM Medical Record Number: [id number] Patient Account Number: [id number] Date of Birth/Sex: 04/11/1930 (85 y.o. F) Treating RN: Curtis Sitesorthy, Joanna Primary Care Gwenette Wellons: Aram BeechamSparks, Jeffrey Other Clinician: Referring Ife Vitelli: Aram BeechamSparks, Jeffrey Treating Kamerin Axford/Extender: Linwood DibblesSTONE III, HOYT Weeks in Treatment: 1 Active Inactive Abuse / Safety / Falls / Self Care Management Nursing Diagnoses: Potential for falls Goals: Patient will remain injury free related to falls Date Initiated: 06/28/2019 Target Resolution Date: 09/21/2019 Goal Status: Active Interventions: Assess fall risk on admission and as needed Notes: Wound/Skin Impairment Nursing Diagnoses: Impaired tissue integrity Goals: Ulcer/skin breakdown will heal within 14 weeks Date Initiated: 06/28/2019 Target Resolution Date:  09/21/2019 Goal Status: Active Interventions: Assess patient/caregiver ability to obtain necessary supplies Assess patient/caregiver ability to perform ulcer/skin care regimen upon admission and as needed Assess ulceration(s) every visit Notes: Electronic Signature(s) Signed: 07/05/2019 4:09:36 PM By: Curtis Sitesorthy, Joanna Entered By: Curtis Sitesorthy, Joanna on 07/05/2019 15:18:54 Natasha BassetHENRY, [name] ([id number]) -------------------------------------------------------------------------------- Pain Assessment Details Patient Name: Natasha BassetHENRY, [name] Date of Service: 07/05/2019 3:00 PM Medical Record Number: [id number] Patient Account Number: [id number] Date of Birth/Sex: 11/27/1930 (85 y.o. F) Treating RN: Rodell PernaScott, Dajea Primary Care Jahzier Villalon: Aram BeechamSparks, Jeffrey Other Clinician: Referring Askia Hazelip: Aram BeechamSparks, Jeffrey Treating [name]: Linwood DibblesSTONE III, HOYT Weeks in Treatment: 1 Active Problems Location of Pain Severity and Description of Pain Patient Has Paino Yes Site Locations Pain Location: Pain in Ulcers Rate the pain. Current Pain Level: 5 Pain Management and Medication Current Pain Management: Electronic Signature(s) Signed: 07/05/2019 3:54:07 PM By: Rodell PernaScott, Dajea Entered By: Rodell PernaScott, Dajea on 07/05/2019 15:05:21 Natasha BassetHENRY, [name] ([id number]) -------------------------------------------------------------------------------- Patient/Caregiver Education Details Patient Name: Natasha BassetHENRY, [name] Date of Service: 07/05/2019 3:00 PM Medical Record Number: [id number] Patient Account Number: [id number] Date of Birth/Gender: 07/23/1930 (85 y.o. F) Treating RN: Curtis Sitesorthy, Joanna Primary Care Physician: Aram BeechamSparks, Jeffrey Other Clinician: Referring Physician: Aram BeechamSparks, Jeffrey Treating Physician/Extender: Skeet SimmerSTONE III, HOYT Weeks in Treatment: 1 Education Assessment Education Provided To: Patient Education Topics Provided Wound/Skin Impairment: Handouts: Other: wound care as ordered Methods: Demonstration,  Explain/Verbal Responses: State content correctly Electronic Signature(s) Signed: 07/05/2019 4:09:36 PM By: Curtis Sitesorthy, Joanna Entered By: Curtis Sitesorthy, Joanna on 07/05/2019 15:39:09 Natasha BassetHENRY, [name] ([id number]) -------------------------------------------------------------------------------- Wound Assessment Details Patient Name: Natasha BassetHENRY, [name] Date of Service: 07/05/2019 3:00 PM Medical Record Number: [id number] Patient Account Number: [id number] Date of Birth/Sex: 08/06/1930 (85 y.o. F) Treating RN: Rodell PernaScott, Dajea Primary Care Suvi Archuletta: Aram BeechamSparks, Jeffrey Other Clinician: Referring Meenakshi Sazama: Aram BeechamSparks, Jeffrey Treating Aoki Wedemeyer/Extender: Linwood DibblesSTONE III, HOYT Weeks in Treatment: 1 Wound Status Wound Number: 11 Primary Skin Tear Etiology: Wound Location: Right Lower Leg - Anterior Wound Open Wounding Event: Trauma Status: Date Acquired: 06/27/2019 Comorbid Cataracts, Glaucoma, Optic Neuritis, Chronic Weeks Of Treatment: 1 History: sinus problems/congestion, Middle ear problems Clustered Wound: No Photos Wound Measurements Length: (cm) 4.5 % Reduction Width: (cm) 3.4 % Reduction Depth: (cm) 0.5 Epithelializ Area: (cm) 12.017 Tunneling: Volume: (cm) 6.008 Undermining in Area: -12.5% in Volume: -12.5% ation: None No : No Wound Description Classification: Partial Thickness Foul Odor A Wound Margin: Flat and Intact Slough/Fibr Exudate Amount: Medium Exudate Type: Sanguinous Exudate Color: red  fter Cleansing: No ino Yes Wound Bed Granulation Amount: Large (67-100%) Exposed Structure Granulation Quality: Red Fascia Exposed: No Necrotic Amount: Small (1-33%) Fat Layer (Subcutaneous Tissue) Exposed: Yes Necrotic Quality: Eschar, Adherent Slough Tendon Exposed: No Muscle Exposed: No Joint Exposed: No Bone Exposed: No Treatment Notes TINZLEY, DALIA ([id number]) Wound #11 (Right, Anterior Lower Leg) Notes silvercell, bordered foam dressing, tubigrip g Electronic Signature(s) Signed:  07/05/2019 3:54:07 PM By: Rodell Perna Entered By: Rodell Perna on 07/05/2019 15:11:20 [name] ([id number]) -------------------------------------------------------------------------------- Vitals Details Patient Name: [name] Date of Service: 07/05/2019 3:00 PM Medical Record Number: [id number] Patient Account Number: [id number] Date of Birth/Sex: 1930-10-13 (85 y.o. F) Treating RN: Rodell Perna Primary Care Calyb Mcquarrie: Aram Beecham Other Clinician: Referring Valory Wetherby: Aram Beecham Treating Donyell Ding/Extender: Linwood Dibbles, HOYT Weeks in Treatment: 1 Vital Signs Time Taken: 15:03 Temperature (F): 98.2 Height (in): 60 Pulse (bpm): 72 Weight (lbs): 125 Respiratory Rate (breaths/min): 16 Body Mass Index (BMI): 24.4 Blood Pressure (mmHg): 124/38 Reference Range: 80 - 120 mg / dl Electronic Signature(s) Signed: 07/05/2019 3:54:07 PM By: Rodell Perna Entered By: Rodell Perna on 07/05/2019 15:05:58

## 2019-07-10 LAB — AEROBIC/ANAEROBIC CULTURE W GRAM STAIN (SURGICAL/DEEP WOUND)

## 2019-07-11 ENCOUNTER — Other Ambulatory Visit: Payer: Self-pay

## 2019-07-11 ENCOUNTER — Ambulatory Visit: Payer: Medicare Other

## 2019-07-11 DIAGNOSIS — Z9181 History of falling: Secondary | ICD-10-CM

## 2019-07-11 DIAGNOSIS — R262 Difficulty in walking, not elsewhere classified: Secondary | ICD-10-CM

## 2019-07-11 DIAGNOSIS — R2681 Unsteadiness on feet: Secondary | ICD-10-CM

## 2019-07-11 DIAGNOSIS — M6281 Muscle weakness (generalized): Secondary | ICD-10-CM

## 2019-07-11 NOTE — Therapy (Addendum)
[place] [facility] [address], [place], [address] Phone: [phone]   Fax:  [phone]  Physical Therapy Treatment  Patient Details  Name: [name] MRN: [id number] Date of Birth: Mar 05, 1930 Referring Provider (PT): Jennings Books, MD   Encounter Date: 07/11/2019  PT End of Session - 07/11/19 1404    Visit Number  55    Number of Visits  25    Date for PT Re-Evaluation  08/08/19    Authorization Type  5    Authorization Time Period  10    PT Start Time  1404    PT Stop Time  1449    PT Time Calculation (min)  45 min    Activity Tolerance  Patient tolerated treatment well;Patient limited by fatigue    Behavior During Therapy  [facility] for tasks assessed/performed       Past Medical History:  Diagnosis Date  . Anemia   . GERD (gastroesophageal reflux disease)   . Hypertension   . Peripheral vascular disease (HCC)    possible neuropathies in lower extremeties  . PONV (postoperative nausea and vomiting)    happens sometimes but better with pre med of zofran  . Presence of permanent cardiac pacemaker   . Syncope     Past Surgical History:  Procedure Laterality Date  . ABDOMINAL HYSTERECTOMY  1969  . APPENDECTOMY  1969   with hysterectomy  . BACK SURGERY  2001   rods in back. surgery on back x 3  . COLONOSCOPY    . I&D EXTREMITY Left 03/27/2018   Procedure: IRRIGATION AND DEBRIDEMENT LEFT ELBOW / OLECRANON BURSA;  Surgeon: Corky Mull, MD;  Location: [facility] ORS;  Service: Orthopedics;  Laterality: Left;  . I&D EXTREMITY Left 06/05/2018   Procedure: IRRIGATION AND DEBRIDEMENT EXTREMITY;  Surgeon: Corky Mull, MD;  Location: [facility] ORS;  Service: Orthopedics;  Laterality: Left;  . INSERT / REPLACE / REMOVE PACEMAKER  2004  . JOINT REPLACEMENT Left 2004   partial knee replacement  . OLECRANON BURSECTOMY Left 03/27/2018   Procedure: LEFT OLECRANON BURSA;  Surgeon: Corky Mull, MD;  Location: [facility] ORS;   Service: Orthopedics;  Laterality: Left;  . PACEMAKER INSERTION  2014   dual chamber for complete heart block    There were no vitals filed for this visit.  Subjective Assessment - 07/11/19 1406    Subjective  Pt states that her leg is bothering her but its better. Has been using her SPC at home some and is doing ok. Uses her rw predominantly. L knee has not been bothering her recently.    Pertinent History  Imparied gait. Pt states that she has had balance issues for too long. Feels more comfortable walking with her rw. Had tests which showed that she has neuropathy. However the doctor at [facility] said that her balance is coming from her back. Just wants to be strong so she can walk.  Started walking with her rw off and on for the past several months.  Also feels like she might need a new sleep number bed. Also had 2 wound surgeries at [name]. Pt fell in February 2019. Had to stop PT due to the L elbow surgery April 06, 2018 to remove a bursa. The wound still has not healed.   Currently sees a wound doctor.   Feels numbness L foot from the ankle down.  No R foot numbness.  Sometimes unsure of where her  L LE is in space.  Pt also states that she tires too quickly.  Has not been able to walk her dog in 2 years. Has not really been doing exercises at home except the sit <> stand. Pt states that prolonged sitting without back support will increase back pain     Currently in Pain?  No/denies    Pain Score  0-No pain                               PT Education - 07/11/19 1416    Education provided  Yes    Education Details  ther-ex    Starwood HotelsPerson(s) Educated  Patient    Methods  Explanation;Demonstration;Tactile cues;Verbal cues    Comprehension  Returned demonstration;Verbalized understanding         OBJECTIVE  Pt ambulating with rw   No latex band allergies    MedbridgeAccess Code: [email or web address]   Pt has pacemaker  Wound with dressing R tibialis  anterior muscle.   Therapeutic exercise:   seated trunk extension isometrics at neutral, manually resisted 10x10secondsfor2sets  Gait with SPC 100 ft CGA to SBA   Standing hip abduction with B UE assist  R 10x with 5 second holds  L 10x with 5 second holds              No resistance secondary to wound in leg  Side stepping CGA 32 ft to the R and 16 ft + 16 ft ft to the L  Rest break when going to the L secondary to fatigue.   SLS with occasional light touch assist  R 5x5 seconds for 2 sets L 5x5 seconds for 2 sets  Stepping over a mini hurdle with SPC assist and CGA from PT 2x2 each LE  Difficulty secondary to fear of falling.  Better able to perform during second set.    Improved exercise technique, movement at target joints, use of target muscles after min to mod verbal, visual, tactile cues.      Patient response to treatment: Good muscle use felt with exercises. No complain of anterior leg pain today with gait or exercises.    Clinical impression Continued working on glute med strengthening to promote single leg balance during gait and when stepping over obstacles. Able to maintain single leg balance about 3 seconds average at least for each LE.  Difficulty with obstacle negotiation using SPC secondary to fear of falling. Slight decrease fear observed with repetition with rest breaks in between. Demonstrating increasing confidence using her SPC at home based on her subjective reports. Pt tolerated session well without aggravation of symptoms. Pt will benefit from continued skilled physical therapy services to improve LE strength, balance, and function.        PT Short Term Goals - 04/25/19 1723      PT SHORT TERM GOAL #1   Title  Patient will be independent with her HEP to improve strength and balance.     Time  3    Period  Weeks    Status  On-going    Target Date  05/08/19        PT Long Term  Goals - 06/11/19 1547      PT LONG TERM GOAL #1   Title  Patient will improve her 10 MWT to 0.8 m/s or more with rw to promote better community ambulation.     Baseline  0.5 seconds with rw (09/06/2018); 0.63  seconds average with rw (10/18/2018); 0.69 m/s average using rw (12/10/2018); 0.59 m/s using SPC and CGA (01/08/2019); 0.60 m/s using SPC (01/21/2019); 0.56 m/s with SPC to no AD assist, CGA (02/04/2019); 0.45 m/s average (04/17/2019); 0.53 m/s with SPC (06/06/2019)    Time  8    Period  Weeks    Status  On-going    Target Date  08/08/19      PT LONG TERM GOAL #2   Title  Patient will improve her DGI score using rw or least restrictive AD to 19/24 or more to promote balance.     Baseline  13/24 using rw (09/06/2018); 14/24 using rw (10/29/2018); 16/24 (12/10/2018); 14/24 using SPC (01/21/2019); 9/24 using SPC (04/17/2019); 14/24 (06/11/2019)    Time  8    Period  Weeks    Status  On-going    Target Date  08/08/19      PT LONG TERM GOAL #3   Title  Patient will improve bilateral LE strength by at least 1/2 MMT to promote ability to support herself when performing standing tasks and improve balance.     Time  8    Period  Weeks    Status  On-going    Target Date  08/08/19      PT LONG TERM GOAL #4   Title  Patient will have a decrease in back pain/ache to 5/10 or less to promote ability to perform standing tasks with less difficulty.    Baseline  8/10 at most for the past 3 months (09/06/2018); 5-6/10 at worst for the past 7 days (10/18/2018); 5/10 at worst for the past 7 days (12/10/2018)    Time  6    Period  Weeks    Status  Achieved            Plan - 07/11/19 1405    Clinical Impression Statement  Continued working on glute med strengthening to promote single leg balance during gait and when stepping over obstacles. Able to maintain single leg balance about 3 seconds average at least for each LE.  Difficulty with obstacle negotiation using SPC secondary to fear of falling. Slight  decrease fear observed with repetition with rest breaks in between. Demonstrating increasing confidence using her SPC at home based on her subjective reports. Pt tolerated session well without aggravation of symptoms. Pt will benefit from continued skilled physical therapy services to improve LE strength, balance, and function.    Rehab Potential  Fair    PT Frequency  2x / week    PT Duration  8 weeks    PT Treatment/Interventions  Aquatic Therapy;Gait training;Therapeutic activities;Therapeutic exercise;Balance training;Neuromuscular re-education;Patient/family education;Manual techniques;Dry needling    PT Next Visit Plan  trunk, hip, scapular strengthening, balance, gait, manual techniques PRN    PT Home Exercise Plan  Sit<>stand, standing hip ext and abd, bridging, and heel raises    Consulted and Agree with Plan of Care  Patient       Patient will benefit from skilled therapeutic intervention in order to improve the following deficits and impairments:  Abnormal gait, Decreased activity tolerance, Decreased balance, Decreased endurance, Decreased range of motion, Decreased strength, Difficulty walking, Improper body mechanics, Postural dysfunction, Pain, Decreased knowledge of use of DME  Visit Diagnosis: 1. Muscle weakness (generalized)   2. Difficulty in walking, not elsewhere classified   3. History of falling   4. Unsteadiness on feet        Problem List Patient Active Problem List   Diagnosis  Date Noted  . Septic olecranon bursitis of left elbow 03/27/2018  . Lymphedema 10/11/2017  . Chronic venous insufficiency 10/11/2017  . Leg pain 10/11/2017  . Leg swelling 10/11/2017  . COPD (chronic obstructive pulmonary disease) (HCC) 10/11/2017  . Essential hypertension 10/11/2017    Loralyn FreshwaterMiguel Jeziel Hoffmann PT, DPT   07/11/2019, 6:45 PM  [facility] [address] [place], [place], [address] Phone: [phone]   Fax:   [phone]  Name: [name] MRN: [id number] Date of Birth: 06/24/1930

## 2019-07-12 ENCOUNTER — Encounter: Payer: Medicare Other | Admitting: Physician Assistant

## 2019-07-12 DIAGNOSIS — L97812 Non-pressure chronic ulcer of other part of right lower leg with fat layer exposed: Secondary | ICD-10-CM | POA: Diagnosis not present

## 2019-07-12 NOTE — Progress Notes (Addendum)
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 centimeter. Surface of the wound looks somewhat better. No debridement is required we used endoform after we ended the trial of puraply last week 3/11; really no improvement at all. Still 1 cm undermining from  roughly 9-3 o'clock. This is about a centimeter. The base of the wound looks fairly healthy. No debridement. We have been using endoform. I am really out of most usual options here. I could consider either another round of an amniotic advanced treatment product example epifix or perhaps regranex. Understandably the patient is a bit frustrated. We did send her to plastic surgery for a consult. Other than prescribing her a brace to immobilize the elbow they did not think she was a candidate for any further surgery. Notable that the patient is not using the brace today 3/18-Patient returns for attention to the left elbow area which apparently looked red at the home health visit. Patient's elbow looks the same if not better compared with last visit. The area of ulceration remains the same, the base appears healthy. We are continuing to use endoform she has been encouraged to use the brace to keep the elbow straight 3/25; the patient has an appointment at the [facility] wound care center on 4/2. I had actually put her out indefinitely however she seems to want to come back here every week. This week she complains of increased pain and malodor. Dimensions of the elbow wound are larger. We have been using endoform 4/8; the patient went to [facility] where they apparently gave her meta honey and some border foam with a Tubigrip. She has been using this for a week. She says she was very impressed with them there. They did not offer her any surgical consultation. She seems to be coming back here for a second opinion on this, she does not wish to drive to [facility] every week 0/99; still using Medihoney foam border and a Tubigrip. Actually do not think she has anything on the arm at all specifically she is not using her brace 5/6; she has been using medihoney without a lot of improvement. Undermining maximum at 9:00 at 0.5 cm may be somewhat better. She is still complaining of discomfort. She uses her elbow brace at night but  is not using anything on the arm during the day, she finds it too restrictive 5/13; we switch the patient to endoform AG last week. She is complaining of more pain and worried about some circumferential erythema around the wound. I had planned to consider epifix in this wound however the patient came in with a request to see a plastic surgeon in [place] by the name of Dr.  Branch who cared for a friend of hers. Noteworthy that I have already sent her to one plastic surgeon and she went for another second opinion at [facility] and they apparently did not send her to a plastic surgeon nevertheless the patient is fairly convinced that she might benefit from a skin graft which I am doubtful. She also has rheumatoid arthritis and is on methotrexate. This was originally a surgical wound for a bursectomy a year or 2 ago Readmission: 06/28/19 on evaluation today patient presents today for reevaluation but this is due to a new issue her elbow has completely healed and looks excellent. Her right anterior lower leg has a skin tear which was sustained from her dog who jumped up on her and inadvertently scratch the area causing the skin tear. This happened yesterday. She is having some discomfort but fortunately nothing too significant which is good news. No fevers, chills, nausea, or vomiting noted at this time. The skin fortunately was knocked one completely off and we are gonna see about we approximate in the skin as best we can in using Steri-Strips to hold this in place obviously if we can get some of this to reattach that would be beneficial NATALINE, BASARA. ([id number]) 07/05/2019 on evaluation today patient actually appears to be doing okay although unfortunately the skin flap that we were attempting to Steri-Stripped down last week did not take. She is developing a lot of fluid underneath the wound area unfortunately which again is not ideal. I think that this necrotic tissue needs to be removed and again  it actually just wiped off during the evaluation today as I was attempting to clean the wound there did not appear to be any significant issues underlying which is good although there was some purulent drainage I did want to go ahead and see about obtaining a culture from today in order to ensure that the Z-Pak that I placed her on earlier in the week was appropriate for treating what ever infection may be causing the issue currently. This was a deep wound culture obtained today. 07/12/2019 on evaluation today patient actually appears to be doing quite well with regard to her right lower extremity ulcer all things considering. There is still little bit of hematoma not it that is noted in the central portion of the wound along with some necrotic tissue but she is still having quite a bit of discomfort. For that reason I did not perform sharp debridement today although this wound does need some debridement of one type or another. I think we may attempt Iodoflex to see if this can be of benefit. Fortunately there is no signs of active infection at this point. Patient History Information obtained from Patient. Family History Heart Disease - Siblings,Father, No family history of Cancer, Diabetes, Hereditary Spherocytosis, Hypertension, Kidney Disease, Lung Disease, Seizures, Stroke, Thyroid Problems, Tuberculosis. Social History Never smoker, Alcohol Use - Daily, Drug Use - No History, Caffeine Use - Never. Medical History Eyes Patient has history of Cataracts, Glaucoma, Optic Neuritis Ear/Nose/Mouth/Throat Patient has history of Chronic sinus problems/congestion, Middle ear problems Medical And Surgical History Notes Constitutional Symptoms (General Health) Back pain Ear/Nose/Mouth/Throat bilateral hearing aides Review of Systems (ROS) Constitutional Symptoms (General Health) Denies complaints or symptoms of Fatigue, Fever, Chills, Marked Weight Change. Respiratory Denies complaints or  symptoms of Chronic or frequent coughs, Shortness of Breath. Cardiovascular Denies complaints or symptoms of Chest pain, LE edema. Psychiatric Denies complaints or symptoms of Anxiety, Claustrophobia. [address] [place], [place] ([id number])  Constitutional Well-nourished and well-hydrated in no acute distress. Vitals Time Taken: 1:05 PM, Height: 60 in, Weight: 125 lbs, BMI: 24.4, Temperature: 98.8 F, Pulse: 63 bpm, Respiratory Rate: 18 breaths/min, Blood Pressure: 153/56 mmHg. Respiratory normal breathing without difficulty. clear to auscultation bilaterally. Cardiovascular regular rate and rhythm with normal S1, S2. Psychiatric this patient is able to make decisions and demonstrates good insight into disease process. Alert and Oriented x 3. pleasant and cooperative. General Notes: Upon inspection patient's wound bed had signs of good granulation at this time. There does not appear to be any evidence of severe infection I think she is doing better in that regard. With that being said I do believe some debridement is necessary but I think Iodoflex will do well to help clean up the surface of this wound. We may need to perform some sharp debridement in the future but again we will get a hold off today secondary to the discomfort that she is experiencing. Also think that the patient needs a compression wrap in order to help control some of the fluid buildup currently. Integumentary (Hair, Skin) Wound #11 status is Open. Original cause of wound was Trauma. The wound is located on the Right,Anterior Lower Leg. The wound measures 4.5cm length x 2.5cm width x 0.3cm depth; 8.836cm^2 area and 2.651cm^3 volume. There is Fat Layer (Subcutaneous Tissue) Exposed exposed. There is no tunneling or undermining noted. There is a medium amount of sanguinous drainage noted. The wound margin is flat and intact. There is large (67-100%) red granulation within the wound bed. There is a small (1-33%) amount of  necrotic tissue within the wound bed including Eschar and Adherent Slough. Assessment Active Problems ICD-10 Unspecified open wound, right lower leg, initial encounter Non-pressure chronic ulcer of other part of right lower leg with fat layer exposed Struck by dog, initial encounter Procedures Wound #11 Pre-procedure diagnosis of Wound #11 is a Skin Tear located on the Right,Anterior Lower Leg . There was a Selective/Open Wound Skin/Epidermis Debridement with a total area of 11.25 sq cm performed by STONE III, HOYT E., PA-C. With the following instrument(s): Forceps, and Scissors to remove Non-Viable tissue/material. Material removed includes Skin: Dermis and Skin: Epidermis and after achieving pain control using Lidocaine 4% Topical Solution. No specimens were taken. A time out was conducted at 13:24, prior to the start of the procedure. There was no bleeding. The procedure was tolerated well with a pain level of 0 throughout and a pain level of 0 following the procedure. Post Debridement Measurements: 4.5cm length x SIARA, GORDER V. ([id number]) 2.5cm width x 0.3cm depth; 2.651cm^3 volume. Character of Wound/Ulcer Post Debridement is improved. Post procedure Diagnosis Wound #11: Same as Pre-Procedure Plan Wound Cleansing: Wound #11 Right,Anterior Lower Leg: Clean wound with Normal Saline. May shower with protection. - Please do not get your wrap wet Primary Wound Dressing: Wound #11 Right,Anterior Lower Leg: Iodoflex Secondary Dressing: Wound #11 Right,Anterior Lower Leg: XtraSorb Dressing Change Frequency: Wound #11 Right,Anterior Lower Leg: Other: - Tuesdays and Fridays Follow-up Appointments: Wound #11 Right,Anterior Lower Leg: Return Appointment in 1 week. Nurse Visit as needed - Tuesday 07/16/19 Edema Control: Wound #11 Right,Anterior Lower Leg: 3 Layer Compression System - Right Lower Extremity 1. I am going to suggest that we switch her dressing to Iodoflex at this  point. The patient is in agreement with that plan. 2. I would also suggest that we switch to a 3 layer compression wrap to help with the edema control I think this will make  a big difference with healing. We will see her back for reevaluation next week for nurse visit on Tuesday and then I will see her on Thursday since she cannot make her Friday appointment next week. 3. I suggested the patient continue to elevate her leg as much as possible I think this is definitely appropriate and will help her as far as healing is concerned. We will see patient back for reevaluation in 1 week here in the clinic. If anything worsens or changes patient will contact our office for additional recommendations. Electronic Signature(s) Signed: 07/12/2019 1:48:24 PM By: Lenda Kelp PA-C Entered By: Lenda Kelp on 07/12/2019 13:48:23 [name] ([id number]) -------------------------------------------------------------------------------- ROS/PFSH Details Patient Name: [name] Date of Service: 07/12/2019 1:00 PM Medical Record Number: [id number] Patient Account Number: [id number] Date of Birth/Sex: [birth year] (85 y.o. F) Treating RN: Curtis Sites Primary Care Provider: Aram Beecham Other Clinician: Referring Provider: Aram Beecham Treating Provider/Extender: Linwood Dibbles, HOYT Weeks in Treatment: 2 Information Obtained From Patient Constitutional Symptoms (General Health) Complaints and Symptoms: Negative for: Fatigue; Fever; Chills; Marked Weight Change Medical History: Past Medical History Notes: Back pain Respiratory Complaints and Symptoms: Negative for: Chronic or frequent coughs; Shortness of Breath Cardiovascular Complaints and Symptoms: Negative for: Chest pain; LE edema Psychiatric Complaints and Symptoms: Negative for: Anxiety; Claustrophobia Eyes Medical History: Positive for: Cataracts; Glaucoma; Optic Neuritis Ear/Nose/Mouth/Throat Medical History: Positive  for: Chronic sinus problems/congestion; Middle ear problems Past Medical History Notes: bilateral hearing aides HBO Extended History Items Ear/Nose/Mouth/Throat: Eyes: Eyes: Ear/Nose/Mouth/Throat: Chronic sinus Cataracts Glaucoma Middle ear problems problems/congestion Immunizations Pneumococcal Vaccine: Received Pneumococcal Vaccination: No Implantable Devices CARIANA, KARGE ([id number]) Yes Family and Social History Cancer: No; Diabetes: No; Heart Disease: Yes - Siblings,Father; Hereditary Spherocytosis: No; Hypertension: No; Kidney Disease: No; Lung Disease: No; Seizures: No; Stroke: No; Thyroid Problems: No; Tuberculosis: No; Never smoker; Alcohol Use: Daily; Drug Use: No History; Caffeine Use: Never; Financial Concerns: No; Food, Clothing or Shelter Needs: No; Support System Lacking: No; Transportation Concerns: No Physician Affirmation I have reviewed and agree with the above information. Electronic Signature(s) Signed: 07/12/2019 4:33:47 PM By: Curtis Sites Signed: 07/14/2019 6:04:32 PM By: Lenda Kelp PA-C Entered By: Lenda Kelp on 07/12/2019 13:47:09 [name] ([id number]) -------------------------------------------------------------------------------- SuperBill Details Patient Name: [name] Date of Service: 07/12/2019 Medical Record Number: [id number] Patient Account Number: [id number] Date of Birth/Sex: 1930-04-22 (85 y.o. F) Treating RN: Curtis Sites Primary Care Provider: Aram Beecham Other Clinician: Referring Provider: Aram Beecham Treating Provider/Extender: Linwood Dibbles, HOYT Weeks in Treatment: 2 Diagnosis Coding ICD-10 Codes Code Description S81.801A Unspecified open wound, right lower leg, initial encounter L97.812 Non-pressure chronic ulcer of other part of right lower leg with fat layer exposed W54.1XXA Struck by dog, initial encounter Facility Procedures CPT4 Code Description: [id number] [phone] - DEBRIDE WOUND 1ST 20 SQ CM OR  < ICD-10 Diagnosis Description L97.812 Non-pressure chronic ulcer of other part of right lower leg with Modifier: fat layer expo Quantity: 1 sed Physician Procedures CPT4 Code Description: [id number] - WC PHYS DEBR WO ANESTH 20 SQ CM ICD-10 Diagnosis Description L97.812 Non-pressure chronic ulcer of other part of right lower leg with Modifier: fat layer expo Quantity: 1 sed Electronic Signature(s) Signed: 07/12/2019 1:49:03 PM By: Lenda Kelp PA-C Entered By: Lenda Kelp on 07/12/2019 13:49:03

## 2019-07-12 NOTE — Progress Notes (Signed)
DELAYZA, LUNGREN ([id number]) Visit Report for 07/12/2019 Arrival Information Details Patient Name: [name], [name]. Date of Service: 07/12/2019 1:00 PM Medical Record Number: [id number] Patient Account Number: [id number] Date of Birth/Sex: 02-Aug-1930 (85 y.o. F) Treating RN: Arnette Norris Primary Care Cyerra Yim: Aram Beecham Other Clinician: Referring Keidrick Murty: Aram Beecham Treating Claudy Abdallah/Extender: Linwood Dibbles, HOYT Weeks in Treatment: 2 Visit Information History Since Last Visit Added or deleted any medications: No Patient Arrived: Walker Any new allergies or adverse reactions: No Arrival Time: 13:04 Had a fall or experienced change in No Accompanied By: self activities of daily living that may affect Transfer Assistance: None risk of falls: Patient Identification Verified: Yes Signs or symptoms of abuse/neglect since last visito No Secondary Verification Process Completed: Yes Hospitalized since last visit: No Has Dressing in Place as Prescribed: Yes Has Compression in Place as Prescribed: Yes Pain Present Now: Yes Electronic Signature(s) Signed: 07/12/2019 4:25:02 PM By: Arnette Norris Entered By: Arnette Norris on 07/12/2019 13:04:56 [name] ([id number]) -------------------------------------------------------------------------------- Encounter Discharge Information Details Patient Name: [name] Date of Service: 07/12/2019 1:00 PM Medical Record Number: [id number] Patient Account Number: [id number] Date of Birth/Sex: 1929/12/31 (85 y.o. F) Treating RN: Curtis Sites Primary Care Ajaya Crutchfield: Aram Beecham Other Clinician: Referring Simranjit Thayer: Aram Beecham Treating Anetta Olvera/Extender: Linwood Dibbles, HOYT Weeks in Treatment: 2 Encounter Discharge Information Items Post Procedure Vitals Discharge Condition: Stable Temperature (F): 98.8 Ambulatory Status: Walker Pulse (bpm): 63 Discharge Destination: Home Respiratory Rate (breaths/min):  16 Transportation: Private Auto Blood Pressure (mmHg): 153/56 Accompanied By: self Schedule Follow-up Appointment: Yes Clinical Summary of Care: Electronic Signature(s) Signed: 07/12/2019 4:33:47 PM By: Curtis Sites Entered By: Curtis Sites on 07/12/2019 13:34:50 [name] ([id number]) -------------------------------------------------------------------------------- Lower Extremity Assessment Details Patient Name: [name] Date of Service: 07/12/2019 1:00 PM Medical Record Number: [id number] Patient Account Number: [id number] Date of Birth/Sex: 1930-09-19 (85 y.o. F) Treating RN: Arnette Norris Primary Care Cynthea Zachman: Aram Beecham Other Clinician: Referring Circe Chilton: Aram Beecham Treating Maurita Havener/Extender: Linwood Dibbles, HOYT Weeks in Treatment: 2 Vascular Assessment Pulses: Dorsalis Pedis Palpable: [Right:Yes] Posterior Tibial Palpable: [Right:Yes] Electronic Signature(s) Signed: 07/12/2019 4:25:02 PM By: Arnette Norris Entered By: Arnette Norris on 07/12/2019 13:17:37 [name] ([id number]) -------------------------------------------------------------------------------- Multi Wound Chart Details Patient Name: [name] Date of Service: 07/12/2019 1:00 PM Medical Record Number: [id number] Patient Account Number: [id number] Date of Birth/Sex: 1930/02/08 (85 y.o. F) Treating RN: Curtis Sites Primary Care Daymon Hora: Aram Beecham Other Clinician: Referring Bradie Lacock: Aram Beecham Treating Paxon Propes/Extender: Linwood Dibbles, HOYT Weeks in Treatment: 2 Vital Signs Height(in): 60 Pulse(bpm): 63 Weight(lbs): 125 Blood Pressure(mmHg): 153/56 Body Mass Index(BMI): 24 Temperature(F): 98.8 Respiratory Rate 18 (breaths/min): Photos: [N/A:N/A] Wound Location: Right Lower Leg - Anterior N/A N/A Wounding Event: Trauma N/A N/A Primary Etiology: Skin Tear N/A N/A Comorbid History: Cataracts, Glaucoma, Optic N/A N/A Neuritis, Chronic  sinus problems/congestion, Middle ear problems Date Acquired: 06/27/2019 N/A N/A Weeks of Treatment: 2 N/A N/A Wound Status: Open N/A N/A Measurements L x W x D 4.5x2.5x0.3 N/A N/A (cm) Area (cm) : 8.836 N/A N/A Volume (cm) : 2.651 N/A N/A % Reduction in Area: 17.30% N/A N/A % Reduction in Volume: 50.40% N/A N/A Classification: Partial Thickness N/A N/A Exudate Amount: Medium N/A N/A Exudate Type: Sanguinous N/A N/A Exudate Color: red N/A N/A Wound Margin: Flat and Intact N/A N/A Granulation Amount: Large (67-100%) N/A N/A Granulation Quality: Red N/A N/A Necrotic Amount: Small (1-33%) N/A N/A Necrotic Tissue: Eschar, Adherent Slough N/A N/A Exposed Structures: Fat Layer (Subcutaneous N/A N/A  Tissue) Exposed: Yes Fascia: No Tendon: No SIARAH, DELEO ([id number]) Muscle: No Joint: No Bone: No Epithelialization: None N/A N/A Treatment Notes Electronic Signature(s) Signed: 07/12/2019 4:33:47 PM By: Montey Hora Entered By: Montey Hora on 07/12/2019 13:27:01 [name] ([id number]) -------------------------------------------------------------------------------- [place] Details Patient Name: [name] Date of Service: 07/12/2019 1:00 PM Medical Record Number: [id number] Patient Account Number: [id number] Date of Birth/Sex: [birth year] (85 y.o. F) Treating RN: Montey Hora Primary Care Laticha Ferrucci: Fulton Reek Other Clinician: Referring Moyses Pavey: Fulton Reek Treating Melo Stauber/Extender: Melburn Hake, HOYT Weeks in Treatment: 2 Active Inactive Abuse / Safety / Falls / Self Care Management Nursing Diagnoses: Potential for falls Goals: Patient will remain injury free related to falls Date Initiated: 06/28/2019 Target Resolution Date: 09/21/2019 Goal Status: Active Interventions: Assess fall risk on admission and as needed Notes: Wound/Skin Impairment Nursing Diagnoses: Impaired tissue integrity Goals: Ulcer/skin breakdown  will heal within 14 weeks Date Initiated: 06/28/2019 Target Resolution Date: 09/21/2019 Goal Status: Active Interventions: Assess patient/caregiver ability to obtain necessary supplies Assess patient/caregiver ability to perform ulcer/skin care regimen upon admission and as needed Assess ulceration(s) every visit Notes: Electronic Signature(s) Signed: 07/12/2019 4:33:47 PM By: Montey Hora Entered By: Montey Hora on 07/12/2019 13:24:40 [name] ([id number]) -------------------------------------------------------------------------------- Pain Assessment Details Patient Name: [name] Date of Service: 07/12/2019 1:00 PM Medical Record Number: [id number] Patient Account Number: [id number] Date of Birth/Sex: 06/18/1930 (85 y.o. F) Treating RN: Harold Barban Primary Care Anniebelle Devore: Fulton Reek Other Clinician: Referring Whitaker Holderman: Fulton Reek Treating Warrene Kapfer/Extender: Melburn Hake, HOYT Weeks in Treatment: 2 Active Problems Location of Pain Severity and Description of Pain Patient Has Paino Yes Site Locations Rate the pain. Current Pain Level: 2 Pain Management and Medication Current Pain Management: Electronic Signature(s) Signed: 07/12/2019 4:25:02 PM By: Harold Barban Entered By: Harold Barban on 07/12/2019 13:05:08 [name] ([id number]) -------------------------------------------------------------------------------- Patient/Caregiver Education Details Patient Name: [name] Date of Service: 07/12/2019 1:00 PM Medical Record Number: [id number] Patient Account Number: [id number] Date of Birth/Gender: 1930-05-11 (85 y.o. F) Treating RN: Montey Hora Primary Care Physician: Fulton Reek Other Clinician: Referring Physician: Fulton Reek Treating Physician/Extender: Sharalyn Ink in Treatment: 2 Education Assessment Education Provided To: Patient Education Topics Provided Venous: Handouts: Other: need for  compression Methods: Explain/Verbal Responses: State content correctly Electronic Signature(s) Signed: 07/12/2019 4:33:47 PM By: Montey Hora Entered By: Montey Hora on 07/12/2019 13:33:38 [name] ([id number]) -------------------------------------------------------------------------------- Wound Assessment Details Patient Name: [name] Date of Service: 07/12/2019 1:00 PM Medical Record Number: [id number] Patient Account Number: [id number] Date of Birth/Sex: Nov 28, 1930 (85 y.o. F) Treating RN: Harold Barban Primary Care Andrus Sharp: Fulton Reek Other Clinician: Referring Tuyen Uncapher: Fulton Reek Treating Fernado Brigante/Extender: Melburn Hake, HOYT Weeks in Treatment: 2 Wound Status Wound Number: 11 Primary Skin Tear Etiology: Wound Location: Right Lower Leg - Anterior Wound Open Wounding Event: Trauma Status: Date Acquired: 06/27/2019 Comorbid Cataracts, Glaucoma, Optic Neuritis, Chronic Weeks Of Treatment: 2 History: sinus problems/congestion, Middle ear problems Clustered Wound: No Photos Wound Measurements Length: (cm) 4.5 % Reduction Width: (cm) 2.5 % Reduction Depth: (cm) 0.3 Epithelializ Area: (cm) 8.836 Tunneling: Volume: (cm) 2.651 Undermining in Area: 17.3% in Volume: 50.4% ation: None No : No Wound Description Classification: Partial Thickness Foul Odor A Wound Margin: Flat and Intact Slough/Fibr Exudate Amount: Medium Exudate Type: Sanguinous Exudate Color: red fter Cleansing: No ino Yes Wound Bed Granulation Amount: Large (67-100%) Exposed Structure Granulation Quality: Red Fascia Exposed: No Necrotic Amount: Small (1-33%) Fat Layer (Subcutaneous Tissue) Exposed:  Yes Necrotic Quality: Eschar, Adherent Slough Tendon Exposed: No Muscle Exposed: No Joint Exposed: No Bone Exposed: No Treatment Notes Natasha BassetHENRY, Temiloluwa V. ([id number]) Wound #11 (Right, Anterior Lower Leg) Notes iodoflex, xtrasorb, 3 layer wrap Electronic  Signature(s) Signed: 07/12/2019 4:25:02 PM By: Arnette NorrisBiell, Kristina Entered By: Arnette NorrisBiell, Kristina on 07/12/2019 13:17:15 Natasha BassetHENRY, Cathline V. ([id number]) -------------------------------------------------------------------------------- Vitals Details Patient Name: Natasha BassetHENRY, Marlie V. Date of Service: 07/12/2019 1:00 PM Medical Record Number: [id number] Patient Account Number: [id number] Date of Birth/Sex: 04/20/1930 (85 y.o. F) Treating RN: Arnette NorrisBiell, Kristina Primary Care Kelleen Stolze: Aram BeechamSparks, Jeffrey Other Clinician: Referring Dariane Natzke: Aram BeechamSparks, Jeffrey Treating Sandrea Boer/Extender: Linwood DibblesSTONE III, HOYT Weeks in Treatment: 2 Vital Signs Time Taken: 13:05 Temperature (F): 98.8 Height (in): 60 Pulse (bpm): 63 Weight (lbs): 125 Respiratory Rate (breaths/min): 18 Body Mass Index (BMI): 24.4 Blood Pressure (mmHg): 153/56 Reference Range: 80 - 120 mg / dl Electronic Signature(s) Signed: 07/12/2019 4:25:02 PM By: Arnette NorrisBiell, Kristina Entered By: Arnette NorrisBiell, Kristina on 07/12/2019 13:09:33

## 2019-07-16 ENCOUNTER — Other Ambulatory Visit: Payer: Self-pay

## 2019-07-16 ENCOUNTER — Ambulatory Visit: Payer: Medicare Other

## 2019-07-16 ENCOUNTER — Encounter: Payer: Medicare Other | Attending: Physician Assistant

## 2019-07-16 DIAGNOSIS — M419 Scoliosis, unspecified: Secondary | ICD-10-CM | POA: Insufficient documentation

## 2019-07-16 DIAGNOSIS — M1990 Unspecified osteoarthritis, unspecified site: Secondary | ICD-10-CM | POA: Insufficient documentation

## 2019-07-16 DIAGNOSIS — M351 Other overlap syndromes: Secondary | ICD-10-CM | POA: Diagnosis not present

## 2019-07-16 DIAGNOSIS — W548XXA Other contact with dog, initial encounter: Secondary | ICD-10-CM | POA: Insufficient documentation

## 2019-07-16 DIAGNOSIS — S81811A Laceration without foreign body, right lower leg, initial encounter: Secondary | ICD-10-CM | POA: Insufficient documentation

## 2019-07-16 DIAGNOSIS — L97812 Non-pressure chronic ulcer of other part of right lower leg with fat layer exposed: Secondary | ICD-10-CM | POA: Insufficient documentation

## 2019-07-16 DIAGNOSIS — R760 Raised antibody titer: Secondary | ICD-10-CM | POA: Insufficient documentation

## 2019-07-16 DIAGNOSIS — M069 Rheumatoid arthritis, unspecified: Secondary | ICD-10-CM | POA: Diagnosis not present

## 2019-07-16 DIAGNOSIS — Z950 Presence of cardiac pacemaker: Secondary | ICD-10-CM | POA: Insufficient documentation

## 2019-07-16 DIAGNOSIS — G5620 Lesion of ulnar nerve, unspecified upper limb: Secondary | ICD-10-CM | POA: Insufficient documentation

## 2019-07-16 DIAGNOSIS — G5602 Carpal tunnel syndrome, left upper limb: Secondary | ICD-10-CM | POA: Insufficient documentation

## 2019-07-16 DIAGNOSIS — Z79899 Other long term (current) drug therapy: Secondary | ICD-10-CM | POA: Insufficient documentation

## 2019-07-16 NOTE — Progress Notes (Signed)
[name], [name] ([id number]) Visit Report for 07/16/2019 Arrival Information Details Patient Name: [name], [name]. Date of Service: 07/16/2019 10:45 AM Medical Record Number: [id number] Patient Account Number: [id number] Date of Birth/Sex: 1930-03-10 (85 y.o. F) Treating RN: Army Melia Primary Care Delmos Velaquez: Fulton Reek Other Clinician: Referring Meiah Zamudio: Fulton Reek Treating Setsuko Robins/Extender: Melburn Hake, HOYT Weeks in Treatment: 2 Visit Information History Since Last Visit Added or deleted any medications: No Patient Arrived: Walker Any new allergies or adverse reactions: No Arrival Time: 11:09 Had a fall or experienced change in No Accompanied By: self activities of daily living that may affect Transfer Assistance: None risk of falls: Signs or symptoms of abuse/neglect since last visito No Hospitalized since last visit: No Has Dressing in Place as Prescribed: Yes Pain Present Now: No Electronic Signature(s) Signed: 07/16/2019 1:06:05 PM By: Army Melia Entered By: Army Melia on 07/16/2019 11:09:24 [name] ([id number]) -------------------------------------------------------------------------------- Compression Therapy Details Patient Name: [name] Date of Service: 07/16/2019 10:45 AM Medical Record Number: [id number] Patient Account Number: [id number] Date of Birth/Sex: Oct 18, 1930 (85 y.o. F) Treating RN: Army Melia Primary Care Ayianna Darnold: Fulton Reek Other Clinician: Referring Kinsley Holderman: Fulton Reek Treating Erinne Gillentine/Extender: Melburn Hake, HOYT Weeks in Treatment: 2 Compression Therapy Performed for Wound Assessment: Wound #11 Right,Anterior Lower Leg Performed By: Clinician Army Melia, RN Compression Type: Three Layer Pre Treatment ABI: 1.2 Electronic Signature(s) Signed: 07/16/2019 1:06:05 PM By: Army Melia Entered By: Army Melia on 07/16/2019 11:11:19 [name]  ([id number]) -------------------------------------------------------------------------------- Encounter Discharge Information Details Patient Name: [name] Date of Service: 07/16/2019 10:45 AM Medical Record Number: [id number] Patient Account Number: [id number] Date of Birth/Sex: [birth year] (85 y.o. F) Treating RN: Army Melia Primary Care Jakyra Kenealy: Fulton Reek Other Clinician: Referring Jyll Tomaro: Fulton Reek Treating Dannis Deroche/Extender: Melburn Hake, HOYT Weeks in Treatment: 2 Encounter Discharge Information Items Discharge Condition: Stable Ambulatory Status: Walker Discharge Destination: Home Transportation: Other Accompanied By: self Schedule Follow-up Appointment: Yes Clinical Summary of Care: Electronic Signature(s) Signed: 07/16/2019 1:06:05 PM By: Army Melia Entered By: Army Melia on 07/16/2019 11:12:25 [name] ([id number]) -------------------------------------------------------------------------------- Wound Assessment Details Patient Name: [name] Date of Service: 07/16/2019 10:45 AM Medical Record Number: [id number] Patient Account Number: [id number] Date of Birth/Sex: 10/24/1930 (85 y.o. F) Treating RN: Army Melia Primary Care Alexios Keown: Fulton Reek Other Clinician: Referring Aerial Dilley: Fulton Reek Treating Ilanna Deihl/Extender: Melburn Hake, HOYT Weeks in Treatment: 2 Wound Status Wound Number: 11 Primary Skin Tear Etiology: Wound Location: Right Lower Leg - Anterior Wound Open Wounding Event: Trauma Status: Date Acquired: 06/27/2019 Comorbid Cataracts, Glaucoma, Optic Neuritis, Chronic Weeks Of Treatment: 2 History: sinus problems/congestion, Middle ear Clustered Wound: No problems Photos Wound Measurements Length: (cm) 3.5 % Reduction Width: (cm) 4.7 % Reduction Depth: (cm) 0.3 Epithelializ Area: (cm) 12.92 Tunneling: Volume: (cm) 3.876 Undermining in Area: -21% in Volume: 27.4% ation: None No : No Wound  Description Classification: Partial Thickness Foul Odor A Wound Margin: Flat and Intact Slough/Fibr Exudate Amount: Medium Exudate Type: Sanguinous Exudate Color: red fter Cleansing: No ino Yes Wound Bed Granulation Amount: Large (67-100%) Exposed Structure Granulation Quality: Red Fascia Exposed: No Necrotic Amount: Small (1-33%) Fat Layer (Subcutaneous Tissue) Exposed: Yes Necrotic Quality: Eschar, Adherent Slough Tendon Exposed: No Muscle Exposed: No Joint Exposed: No Bone Exposed: No Treatment Notes WYATT, GALVAN ([id number]) Wound #11 (Right, Anterior Lower Leg) Notes iodoflex, xtrasorb, 3 layer wrap Electronic Signature(s) Signed: 07/16/2019 1:06:05 PM By: Army Melia Entered By: Army Melia on 07/16/2019 11:10:40

## 2019-07-18 ENCOUNTER — Encounter: Payer: Medicare Other | Admitting: Physician Assistant

## 2019-07-18 ENCOUNTER — Other Ambulatory Visit: Payer: Self-pay

## 2019-07-18 DIAGNOSIS — S81811A Laceration without foreign body, right lower leg, initial encounter: Secondary | ICD-10-CM | POA: Diagnosis not present

## 2019-07-18 NOTE — Progress Notes (Signed)
[name], [name] ([id number]) Visit Report for 07/18/2019 Arrival Information Details Patient Name: [name], [name] Date of Service: 07/18/2019 10:45 AM Medical Record Number: [id number] Patient Account Number: [id number] Date of Birth/Sex: 04/16/1930 (85 y.o. F) Treating RN: Rodell PernaScott, Dajea Primary Care Eathel Pajak: Aram BeechamSparks, Jeffrey Other Clinician: Referring Christine Schiefelbein: Aram BeechamSparks, Jeffrey Treating Stanislaus Kaltenbach/Extender: Linwood DibblesSTONE III, HOYT Weeks in Treatment: 2 Visit Information History Since Last Visit Added or deleted any medications: No Patient Arrived: [place] Any new allergies or adverse reactions: No Arrival Time: 10:45 Had a fall or experienced change in No Accompanied By: self activities of daily living that may affect Transfer Assistance: None risk of falls: Patient Identification Verified: Yes Signs or symptoms of abuse/neglect since last visito No Secondary Verification Process Completed: Yes Hospitalized since last visit: No Implantable device outside of the clinic excluding No cellular tissue based products placed in the center since last visit: Has Dressing in Place as Prescribed: Yes Pain Present Now: Yes Electronic Signature(s) Signed: 07/18/2019 3:35:22 PM By: Dayton MartesWallace, RCP,RRT,CHT, Sallie RCP, RRT, CHT Entered By: Dayton MartesWallace, RCP,RRT,CHT, Sallie on 07/18/2019 10:46:11 [name], [name] ([id number]) -------------------------------------------------------------------------------- Encounter Discharge Information Details Patient Name: [name], [name] Date of Service: 07/18/2019 10:45 AM Medical Record Number: [id number] Patient Account Number: [id number] Date of Birth/Sex: 04/09/1930 (85 y.o. F) Treating RN: Rodell PernaScott, Dajea Primary Care Cleotis Sparr: Aram BeechamSparks, Jeffrey Other Clinician: Referring Joei Frangos: Aram BeechamSparks, Jeffrey Treating Antoinetta Berrones/Extender: Linwood DibblesSTONE III, HOYT Weeks in Treatment: 2 Encounter Discharge Information Items Post Procedure Vitals Discharge Condition: Stable Temperature (F):  98.4 Ambulatory Status: Cane Pulse (bpm): 68 Discharge Destination: Home Respiratory Rate (breaths/min): 16 Transportation: Private Auto Blood Pressure (mmHg): 114/50 Accompanied By: self Schedule Follow-up Appointment: Yes Clinical Summary of Care: Electronic Signature(s) Signed: 07/18/2019 11:48:34 AM By: Rodell PernaScott, Dajea Entered By: Rodell PernaScott, Dajea on 07/18/2019 11:27:56 [name], [name] ([id number]) -------------------------------------------------------------------------------- Lower Extremity Assessment Details Patient Name: [name], [name] Date of Service: 07/18/2019 10:45 AM Medical Record Number: [id number] Patient Account Number: [id number] Date of Birth/Sex: 05/27/1930 (85 y.o. F) Treating RN: Curtis Sitesorthy, Joanna Primary Care Sharae Zappulla: Aram BeechamSparks, Jeffrey Other Clinician: Referring Delta Pichon: Aram BeechamSparks, Jeffrey Treating Amron Guerrette/Extender: Linwood DibblesSTONE III, HOYT Weeks in Treatment: 2 Edema Assessment Assessed: [Left: No] [Right: No] Edema: [Left: N] [Right: o] Calf Left: Right: Point of Measurement: 30 cm From Medial Instep cm 34 cm Ankle Left: Right: Point of Measurement: 10 cm From Medial Instep cm 21 cm Vascular Assessment Pulses: Dorsalis Pedis Palpable: [Right:Yes] Electronic Signature(s) Signed: 07/18/2019 4:34:48 PM By: Curtis Sitesorthy, Joanna Entered By: Curtis Sitesorthy, Joanna on 07/18/2019 10:54:32 [name], [name] ([id number]) -------------------------------------------------------------------------------- Multi Wound Chart Details Patient Name: [name], [name] Date of Service: 07/18/2019 10:45 AM Medical Record Number: [id number] Patient Account Number: [id number] Date of Birth/Sex: 10/30/1930 (85 y.o. F) Treating RN: Rodell PernaScott, Dajea Primary Care Jovann Luse: Aram BeechamSparks, Jeffrey Other Clinician: Referring Aidah Forquer: Aram BeechamSparks, Jeffrey Treating Sequan Auxier/Extender: Linwood DibblesSTONE III, HOYT Weeks in Treatment: 2 Vital Signs Height(in): 60 Pulse(bpm): 68 Weight(lbs): 125 Blood Pressure(mmHg): 114/50 Body Mass Index(BMI):  24 Temperature(F): 98.4 Respiratory Rate 18 (breaths/min): Photos: [N/A:N/A] Wound Location: Right Lower Leg - Anterior N/A N/A Wounding Event: Trauma N/A N/A Primary Etiology: Skin Tear N/A N/A Comorbid History: Cataracts, Glaucoma, Optic N/A N/A Neuritis, Chronic sinus problems/congestion, Middle ear problems Date Acquired: 06/27/2019 N/A N/A Weeks of Treatment: 2 N/A N/A Wound Status: Open N/A N/A Measurements L x W x D 3.4x4.1x0.2 N/A N/A (cm) Area (cm) : 10.948 N/A N/A Volume (cm) : 2.19 N/A N/A % Reduction in Area: -2.50% N/A N/A % Reduction in Volume: 59.00% N/A N/A Classification: Partial Thickness N/A N/A  Exudate Amount: Medium N/A N/A Exudate Type: Sanguinous N/A N/A Exudate Color: red N/A N/A Wound Margin: Flat and Intact N/A N/A Granulation Amount: Medium (34-66%) N/A N/A Granulation Quality: Red N/A N/A Necrotic Amount: Medium (34-66%) N/A N/A Necrotic Tissue: Eschar, Adherent Slough N/A N/A Exposed Structures: Fat Layer (Subcutaneous N/A N/A Tissue) Exposed: Yes Fascia: No Tendon: No LING, FLESCH ([id number]) Muscle: No Joint: No Bone: No Epithelialization: None N/A N/A Treatment Notes Electronic Signature(s) Signed: 07/18/2019 11:48:34 AM By: Rodell Perna Entered By: Rodell Perna on 07/18/2019 11:16:06 [name] ([id number]) -------------------------------------------------------------------------------- Multi-Disciplinary Care Plan Details Patient Name: [name] Date of Service: 07/18/2019 10:45 AM Medical Record Number: [id number] Patient Account Number: [id number] Date of Birth/Sex: [birth year] (85 y.o. F) Treating RN: Rodell Perna Primary Care Shalayna Ornstein: Aram Beecham Other Clinician: Referring Yuval Nolet: Aram Beecham Treating Kayci Belleville/Extender: Linwood Dibbles, HOYT Weeks in Treatment: 2 Active Inactive Abuse / Safety / Falls / Self Care Management Nursing Diagnoses: Potential for falls Goals: Patient will remain injury free  related to falls Date Initiated: 06/28/2019 Target Resolution Date: 09/21/2019 Goal Status: Active Interventions: Assess fall risk on admission and as needed Notes: Wound/Skin Impairment Nursing Diagnoses: Impaired tissue integrity Goals: Ulcer/skin breakdown will heal within 14 weeks Date Initiated: 06/28/2019 Target Resolution Date: 09/21/2019 Goal Status: Active Interventions: Assess patient/caregiver ability to obtain necessary supplies Assess patient/caregiver ability to perform ulcer/skin care regimen upon admission and as needed Assess ulceration(s) every visit Notes: Electronic Signature(s) Signed: 07/18/2019 11:48:34 AM By: Rodell Perna Entered By: Rodell Perna on 07/18/2019 11:15:53 [name] ([id number]) -------------------------------------------------------------------------------- Pain Assessment Details Patient Name: [name] Date of Service: 07/18/2019 10:45 AM Medical Record Number: [id number] Patient Account Number: [id number] Date of Birth/Sex: 10/28/1930 (85 y.o. F) Treating RN: Rodell Perna Primary Care Gayle Collard: Aram Beecham Other Clinician: Referring Courtnie Brenes: Aram Beecham Treating Dalyn Kjos/Extender: Linwood Dibbles, HOYT Weeks in Treatment: 2 Active Problems Location of Pain Severity and Description of Pain Patient Has Paino Yes Site Locations Rate the pain. Current Pain Level: 2 Pain Management and Medication Current Pain Management: Electronic Signature(s) Signed: 07/18/2019 11:48:34 AM By: Rodell Perna Signed: 07/18/2019 3:35:22 PM By: Dayton Martes RCP, RRT, CHT Entered By: Dayton Martes on 07/18/2019 10:46:41 [name] ([id number]) -------------------------------------------------------------------------------- Patient/Caregiver Education Details Patient Name: [name] Date of Service: 07/18/2019 10:45 AM Medical Record Number: [id number] Patient Account Number: [id number] Date of  Birth/Gender: 06-Jun-1930 (85 y.o. F) Treating RN: Rodell Perna Primary Care Physician: Aram Beecham Other Clinician: Referring Physician: Aram Beecham Treating Physician/Extender: Skeet Simmer in Treatment: 2 Education Assessment Education Provided To: Patient Education Topics Provided Wound/Skin Impairment: Handouts: Caring for Your Ulcer Methods: Demonstration, Explain/Verbal Responses: State content correctly Electronic Signature(s) Signed: 07/18/2019 11:48:34 AM By: Rodell Perna Entered By: Rodell Perna on 07/18/2019 11:26:52 [name] ([id number]) -------------------------------------------------------------------------------- Wound Assessment Details Patient Name: [name] Date of Service: 07/18/2019 10:45 AM Medical Record Number: [id number] Patient Account Number: [id number] Date of Birth/Sex: January 12, 1930 (85 y.o. F) Treating RN: Curtis Sites Primary Care Harlyn Italiano: Aram Beecham Other Clinician: Referring Saxton Chain: Aram Beecham Treating Roseanne Juenger/Extender: Linwood Dibbles, HOYT Weeks in Treatment: 2 Wound Status Wound Number: 11 Primary Skin Tear Etiology: Wound Location: Right Lower Leg - Anterior Wound Open Wounding Event: Trauma Status: Date Acquired: 06/27/2019 Comorbid Cataracts, Glaucoma, Optic Neuritis, Chronic Weeks Of Treatment: 2 History: sinus problems/congestion, Middle ear problems Clustered Wound: No Photos Wound Measurements Length: (cm) 3.4 % Reduction Width: (cm) 4.1 % Reduction Depth: (cm) 0.2 Epithelializ Area: (cm) 10.948  Tunneling: Volume: (cm) 2.19 Undermining in Area: -2.5% in Volume: 59% ation: None No : No Wound Description Classification: Partial Thickness Foul Odor A Wound Margin: Flat and Intact Slough/Fibr Exudate Amount: Medium Exudate Type: Sanguinous Exudate Color: red fter Cleansing: No ino Yes Wound Bed Granulation Amount: Medium (34-66%) Exposed Structure Granulation Quality: Red Fascia  Exposed: No Necrotic Amount: Medium (34-66%) Fat Layer (Subcutaneous Tissue) Exposed: Yes Necrotic Quality: Eschar, Adherent Slough Tendon Exposed: No Muscle Exposed: No Joint Exposed: No Bone Exposed: No Treatment Notes [name], [name] ([id number]) Wound #11 (Right, Anterior Lower Leg) Notes silver collage, xtrasorb, 3 layer wrap Electronic Signature(s) Signed: 07/18/2019 4:34:48 PM By: Montey Hora Entered By: Montey Hora on 07/18/2019 11:00:05 [name] ([id number]) -------------------------------------------------------------------------------- [place] Details Patient Name: [name] Date of Service: 07/18/2019 10:45 AM Medical Record Number: [id number] Patient Account Number: [id number] Date of Birth/Sex: Sep 02, 1930 (85 y.o. F) Treating RN: Army Melia Primary Care Cleofas Hudgins: Fulton Reek Other Clinician: Referring Lorna Strother: Fulton Reek Treating Leelynd Maldonado/Extender: Melburn Hake, HOYT Weeks in Treatment: 2 Vital Signs Time Taken: 10:45 Temperature (F): 98.4 Height (in): 60 Pulse (bpm): 68 Weight (lbs): 125 Respiratory Rate (breaths/min): 18 Body Mass Index (BMI): 24.4 Blood Pressure (mmHg): 114/50 Reference Range: 80 - 120 mg / dl Electronic Signature(s) Signed: 07/18/2019 3:35:22 PM By: Lorine Bears RCP, RRT, CHT Entered By: Lorine Bears on 07/18/2019 10:51:16

## 2019-07-18 NOTE — Progress Notes (Addendum)
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 however the undermining is not really changed 2/26; still not much improvement. She has undermining from 4-9 o'clock 0.9 cm. Surface of the wound covered and adherent debris. 3/4; still no improvement. Undermining from 4-9 o'clock still around a centimeter. Surface of the wound looks somewhat better. No debridement is required we used endoform after we ended the trial of puraply last week 3/11; really no improvement at  all. Still 1 cm undermining from roughly 9-3 o'clock. This is about a centimeter. The base of the wound looks fairly healthy. No debridement. We have been using endoform. I am really out of most usual options here. I could consider either another round of an amniotic advanced treatment product example epifix or perhaps regranex. Understandably the patient is a bit frustrated. We did send her to plastic surgery for a consult. Other than prescribing her a brace to immobilize the elbow they did not think she was a candidate for any further surgery. Notable that the patient is not using the brace today 3/18-Patient returns for attention to the left elbow area which apparently looked red at the home health visit. Patient's elbow looks the same if not better compared with last visit. The area of ulceration remains the same, the base appears healthy. We are continuing to use endoform she has been encouraged to use the brace to keep the elbow straight 3/25; the patient has an appointment at the [facility] wound care center on 4/2. I had actually put her out indefinitely however she seems to want to come back here every week. This week she complains of increased pain and malodor. Dimensions of the elbow wound are larger. We have been using endoform 4/8; the patient went to [facility] where they apparently gave her meta honey and some border foam with a Tubigrip. She has been using this for a week. She says she was very impressed with them there. They did not offer her any surgical consultation. She seems to be coming back here for a second opinion on this, she does not wish to drive to [facility] every week 9/81; still using Medihoney foam border and a Tubigrip. Actually do not think she has anything on the arm at all specifically she is not using her brace 5/6; she has been using medihoney without a lot of improvement. Undermining maximum at 9:00 at 0.5 cm may be somewhat better. She is still complaining of discomfort. She  uses her elbow brace at night but is not using anything on the arm during the day, she finds it too restrictive 5/13; we switch the patient to endoform AG last week. She is complaining of more pain and worried about some circumferential erythema around the wound. I had planned to consider epifix  in this wound however the patient came in with a request to see a plastic surgeon in [place] by the name of Dr. Wyline Mood who cared for a friend of hers. Noteworthy that I have already sent her to one plastic surgeon and she went for another second opinion at [facility] and they apparently did not send her to a plastic surgeon nevertheless the patient is fairly convinced that she might benefit from a skin graft which I am doubtful. She also has rheumatoid arthritis and is on methotrexate. This was originally a surgical wound for a bursectomy a year or 2 ago Readmission: 06/28/19 on evaluation today patient presents today for reevaluation but this is due to a new issue her elbow has completely healed and looks excellent. Her right anterior lower leg has a skin tear which was sustained from her dog who jumped up on her and inadvertently scratch the area causing the skin tear. This happened yesterday. She is having some discomfort but fortunately nothing too significant which is good news. No fevers, chills, nausea, or vomiting noted at this time. The skin fortunately was knocked one completely off and we are gonna see about we approximate in the skin as best we can in using Steri-Strips to hold this in place obviously if we can get some of this to reattach that would be beneficial DORCAS, MELITO. ([id number]) 07/05/2019 on evaluation today patient actually appears to be doing okay although unfortunately the skin flap that we were attempting to Steri-Stripped down last week did not take. She is developing a lot of fluid underneath the wound area unfortunately which again is not ideal. I think that this necrotic  tissue needs to be removed and again it actually just wiped off during the evaluation today as I was attempting to clean the wound there did not appear to be any significant issues underlying which is good although there was some purulent drainage I did want to go ahead and see about obtaining a culture from today in order to ensure that the Z-Pak that I placed her on earlier in the week was appropriate for treating what ever infection may be causing the issue currently. This was a deep wound culture obtained today. 07/12/2019 on evaluation today patient actually appears to be doing quite well with regard to her right lower extremity ulcer all things considering. There is still little bit of hematoma not it that is noted in the central portion of the wound along with some necrotic tissue but she is still having quite a bit of discomfort. For that reason I did not perform sharp debridement today although this wound does need some debridement of one type or another. I think we may attempt Iodoflex to see if this can be of benefit. Fortunately there is no signs of active infection at this point. 07/18/19 upon evaluation today patient appears to be doing better with regard to her ulcer on her right lower extremity. She's been tolerating the dressing changes without complication. The good news is she seems to be making excellent progress. Overall I'm pleased with there being no signs of infection. With that being said she does tell me that she's been having some discomfort with the wrap she's unsure of exactly what about the rafters causing her trouble. Patient History Information obtained from Patient. Family History Heart Disease - Siblings,Father, No family history of Cancer, Diabetes, Hereditary Spherocytosis, Hypertension, Kidney Disease, Lung Disease, Seizures, Stroke, Thyroid Problems, Tuberculosis. Social History Never smoker, Alcohol Use - Daily, Drug Use -  No History, Caffeine Use -  Never. Medical History Eyes Patient has history of Cataracts, Glaucoma, Optic Neuritis Ear/Nose/Mouth/Throat Patient has history of Chronic sinus problems/congestion, Middle ear problems Medical And Surgical History Notes Constitutional Symptoms (General Health) Back pain Ear/Nose/Mouth/Throat bilateral hearing aides Review of Systems (ROS) Constitutional Symptoms ([place]) Denies complaints or symptoms of Fatigue, Fever, Chills, Marked Weight Change. Respiratory Denies complaints or symptoms of Chronic or frequent coughs, Shortness of Breath. Cardiovascular Complains or has symptoms of LE edema. Denies complaints or symptoms of Chest pain. Psychiatric Denies complaints or symptoms of Anxiety, Claustrophobia. BRITINY, DEFRAIN ([id number]) Objective Constitutional Well-nourished and well-hydrated in no acute distress. Vitals Time Taken: 10:45 AM, Height: 60 in, Weight: 125 lbs, BMI: 24.4, Temperature: 98.4 F, Pulse: 68 bpm, Respiratory Rate: 18 breaths/min, Blood Pressure: 114/50 mmHg. Respiratory normal breathing without difficulty. Psychiatric this patient is able to make decisions and demonstrates good insight into disease process. Alert and Oriented x 3. pleasant and cooperative. General Notes: On inspection patient's wound bed showed evidence of some Slough noted on the surface of the wound which did require sharp agreement. She tolerated the debridement without complication and post debridement wound bed appears to be doing much better which is excellent news. She did have some bleeding on it up having to use one silver nitrate sticks the can with a cut arise the bleeding but others this she had no other complications. Integumentary (Hair, Skin) Wound #11 status is Open. Original cause of wound was Trauma. The wound is located on the Right,Anterior Lower Leg. The wound measures 3.4cm length x 4.1cm width x 0.2cm depth; 10.948cm^2 area and 2.19cm^3 volume. There is  Fat Layer (Subcutaneous Tissue) Exposed exposed. There is no tunneling or undermining noted. There is a medium amount of sanguinous drainage noted. The wound margin is flat and intact. There is medium (34-66%) red granulation within the wound bed. There is a medium (34-66%) amount of necrotic tissue within the wound bed including Eschar and Adherent Slough. Assessment Active Problems ICD-10 Unspecified open wound, right lower leg, initial encounter Non-pressure chronic ulcer of other part of right lower leg with fat layer exposed Struck by dog, initial encounter Procedures Wound #11 Pre-procedure diagnosis of Wound #11 is a Skin Tear located on the Right,Anterior Lower Leg . There was a Excisional Skin/Subcutaneous Tissue Debridement with a total area of 13.94 sq cm performed by STONE III, Kyrianna Barletta E., PA-C. With the following instrument(s): Curette to remove Viable and Non-Viable tissue/material. Material removed includes Eschar, Subcutaneous Tissue, and Slough. A time out was conducted at 11:15, prior to the start of the procedure. A Minimum amount of bleeding was controlled with Pressure. The procedure was tolerated well. Post Debridement Measurements: 3.4cm length x 4.1cm width x 0.1cm depth; 1.095cm^3 volume. TANASHA, MENEES ([id number]) Character of Wound/Ulcer Post Debridement is stable. Post procedure Diagnosis Wound #11: Same as Pre-Procedure Plan Wound Cleansing: Wound #11 Right,Anterior Lower Leg: Clean wound with Normal Saline. May shower with protection. - Please do not get your wrap wet Primary Wound Dressing: Wound #11 Right,Anterior Lower Leg: Silver Collagen Secondary Dressing: Wound #11 Right,Anterior Lower Leg: XtraSorb Dressing Change Frequency: Wound #11 Right,Anterior Lower Leg: Dressing is to be changed Monday and Thursday. Follow-up Appointments: Wound #11 Right,Anterior Lower Leg: Return Appointment in 1 week. Nurse Visit as needed - Tuesday 07/22/19 Edema  Control: Wound #11 Right,Anterior Lower Leg: 3 Layer Compression System - Right Lower Extremity 1. At this point my suggestion is gonna be that we actually go  ahead and continue with the silver collagen dressing covered with the extras orbit seems to be beneficial for her. This again is changed on Monday and Thursday along with her wraps. 2. We will continue with the three layer compression wrap for the time being and she is in agreement with that plan. Subsequently I did suggest for the patient that she needs to as much as possible try to elevate her legs which I think can also be beneficial. Please see above for specific wound care orders. We will see patient for re-evaluation in 1 week(s) here in the clinic. If anything worsens or changes patient will contact our office for additional recommendations. Electronic Signature(s) Signed: 07/19/2019 2:47:02 AM By: Lenda KelpStone III, Leeman Johnsey PA-C Entered By: Lenda KelpStone III, Reilyn Nelson on 07/19/2019 02:10:21 Natasha BassetHENRY, Emberlyn V. ([id number]) -------------------------------------------------------------------------------- ROS/PFSH Details Patient Name: Natasha BassetHENRY, Emmamae V. Date of Service: 07/18/2019 10:45 AM Medical Record Number: [id number] Patient Account Number: [id number] Date of Birth/Sex: 12/23/1929 (85 y.o. F) Treating RN: Rodell PernaScott, Dajea Primary Care Provider: Aram BeechamSparks, Jeffrey Other Clinician: Referring Provider: Aram BeechamSparks, Jeffrey Treating Provider/Extender: Linwood DibblesSTONE III, Devon Kingdon Weeks in Treatment: 2 Information Obtained From Patient Constitutional Symptoms (General Health) Complaints and Symptoms: Negative for: Fatigue; Fever; Chills; Marked Weight Change Medical History: Past Medical History Notes: Back pain Respiratory Complaints and Symptoms: Negative for: Chronic or frequent coughs; Shortness of Breath Cardiovascular Complaints and Symptoms: Positive for: LE edema Negative for: Chest pain Psychiatric Complaints and Symptoms: Negative for: Anxiety;  Claustrophobia Eyes Medical History: Positive for: Cataracts; Glaucoma; Optic Neuritis Ear/Nose/Mouth/Throat Medical History: Positive for: Chronic sinus problems/congestion; Middle ear problems Past Medical History Notes: bilateral hearing aides HBO Extended History Items Ear/Nose/Mouth/Throat: Eyes: Eyes: Ear/Nose/Mouth/Throat: Chronic sinus Cataracts Glaucoma Middle ear problems problems/congestion Immunizations Pneumococcal Vaccine: Received Pneumococcal Vaccination: No Natasha BassetHENRY, Natally V. ([id number]) Implantable Devices Yes Family and Social History Cancer: No; Diabetes: No; Heart Disease: Yes - Siblings,Father; Hereditary Spherocytosis: No; Hypertension: No; Kidney Disease: No; Lung Disease: No; Seizures: No; Stroke: No; Thyroid Problems: No; Tuberculosis: No; Never smoker; Alcohol Use: Daily; Drug Use: No History; Caffeine Use: Never; Financial Concerns: No; Food, Clothing or Shelter Needs: No; Support System Lacking: No; Transportation Concerns: No Physician Affirmation I have reviewed and agree with the above information. Electronic Signature(s) Signed: 07/19/2019 2:47:02 AM By: Lenda KelpStone III, Yasmene Salomone PA-C Signed: 07/19/2019 11:48:59 AM By: Rodell PernaScott, Dajea Entered By: Lenda KelpStone III, Carnie Bruemmer on 07/19/2019 02:08:37 Natasha BassetHENRY, Undine V. ([id number]) -------------------------------------------------------------------------------- SuperBill Details Patient Name: Natasha BassetHENRY, Tykerria V. Date of Service: 07/18/2019 Medical Record Number: [id number] Patient Account Number: [id number] Date of Birth/Sex: 07/02/1930 (85 y.o. F) Treating RN: Rodell PernaScott, Dajea Primary Care Provider: Aram BeechamSparks, Jeffrey Other Clinician: Referring Provider: Aram BeechamSparks, Jeffrey Treating Provider/Extender: Linwood DibblesSTONE III, Bri Wakeman Weeks in Treatment: 2 Diagnosis Coding ICD-10 Codes Code Description S81.801A Unspecified open wound, right lower leg, initial encounter L97.812 Non-pressure chronic ulcer of other part of right lower leg with fat layer  exposed W54.1XXA Struck by dog, initial encounter Facility Procedures CPT4 Code Description: 5366440336100012 [id number] - DEB SUBQ TISSUE 20 SQ CM/< ICD-10 Diagnosis Description L97.812 Non-pressure chronic ulcer of other part of right lower leg wit Modifier: h fat layer expo Quantity: 1 sed Physician Procedures CPT4 Code Description: 95638756770168 11042 - WC PHYS SUBQ TISS 20 SQ CM ICD-10 Diagnosis Description L97.812 Non-pressure chronic ulcer of other part of right lower leg wit Modifier: h fat layer expo Quantity: 1 sed Electronic Signature(s) Signed: 07/19/2019 2:47:02 AM By: Lenda KelpStone III, Koehn Salehi PA-C Entered By: Lenda KelpStone III, Prisila Dlouhy on 07/19/2019 02:10:31

## 2019-07-22 ENCOUNTER — Other Ambulatory Visit: Payer: Self-pay

## 2019-07-22 DIAGNOSIS — S81811A Laceration without foreign body, right lower leg, initial encounter: Secondary | ICD-10-CM | POA: Diagnosis not present

## 2019-07-22 NOTE — Progress Notes (Signed)
[name], [name] ([id number]) Visit Report for 07/22/2019 Arrival Information Details Patient Name: [name], [name]. Date of Service: 07/22/2019 1:30 PM Medical Record Number: [id number] Patient Account Number: [id number] Date of Birth/Sex: [birth year] (85 y.o. F) Treating RN: Army Melia Primary Care Hafiz Irion: Fulton Reek Other Clinician: Referring Kais Monje: Fulton Reek Treating Kataya Guimont/Extender: Melburn Hake, HOYT Weeks in Treatment: 3 Visit Information History Since Last Visit Added or deleted any medications: No Patient Arrived: [place] Any new allergies or adverse reactions: No Arrival Time: 13:43 Had a fall or experienced change in No Accompanied By: family activities of daily living that may affect Transfer Assistance: None risk of falls: Signs or symptoms of abuse/neglect since last visito No Hospitalized since last visit: No Pain Present Now: Yes Electronic Signature(s) Signed: 07/22/2019 4:13:40 PM By: Army Melia Entered By: Army Melia on 07/22/2019 13:43:47 [name] ([id number]) -------------------------------------------------------------------------------- Clinic Level of Care Assessment Details Patient Name: [name] Date of Service: 07/22/2019 1:30 PM Medical Record Number: [id number] Patient Account Number: [id number] Date of Birth/Sex: August 30, 1930 (85 y.o. F) Treating RN: Army Melia Primary Care Clarkson Rosselli: Fulton Reek Other Clinician: Referring Keigan Girten: Fulton Reek Treating Paula Zietz/Extender: Melburn Hake, HOYT Weeks in Treatment: 3 Clinic Level of Care Assessment Items TOOL 4 Quantity Score []  - Use when only an EandM is performed on FOLLOW-UP visit 0 ASSESSMENTS - Nursing Assessment / Reassessment X - Reassessment of Co-morbidities (includes updates in patient status) 1 10 X- 1 5 Reassessment of Adherence to Treatment Plan ASSESSMENTS - Wound and Skin Assessment / Reassessment X - Simple Wound Assessment / Reassessment - one  wound 1 5 []  - 0 Complex Wound Assessment / Reassessment - multiple wounds []  - 0 Dermatologic / Skin Assessment (not related to wound area) ASSESSMENTS - Focused Assessment []  - Circumferential Edema Measurements - multi extremities 0 []  - 0 Nutritional Assessment / Counseling / Intervention []  - 0 Lower Extremity Assessment (monofilament, tuning fork, pulses) []  - 0 Peripheral Arterial Disease Assessment (using hand held doppler) ASSESSMENTS - Ostomy and/or Continence Assessment and Care []  - Incontinence Assessment and Management 0 []  - 0 Ostomy Care Assessment and Management (repouching, etc.) PROCESS - Coordination of Care X - Simple Patient / Family Education for ongoing care 1 15 []  - 0 Complex (extensive) Patient / Family Education for ongoing care []  - 0 Staff obtains Programmer, systems, Records, Test Results / Process Orders []  - 0 Staff telephones HHA, Nursing Homes / Clarify orders / etc []  - 0 Routine Transfer to another Facility (non-emergent condition) []  - 0 Routine Hospital Admission (non-emergent condition) []  - 0 New Admissions / Biomedical engineer / Ordering NPWT, Apligraf, etc. []  - 0 Emergency Hospital Admission (emergent condition) X- 1 10 Simple Discharge Coordination [name], [name] ([id number]) []  - 0 Complex (extensive) Discharge Coordination PROCESS - Special Needs []  - Pediatric / Minor Patient Management 0 []  - 0 Isolation Patient Management []  - 0 Hearing / Language / Visual special needs []  - 0 Assessment of Community assistance (transportation, D/C planning, etc.) []  - 0 Additional assistance / Altered mentation []  - 0 Support Surface(s) Assessment (bed, cushion, seat, etc.) INTERVENTIONS - Wound Cleansing / Measurement X - Simple Wound Cleansing - one wound 1 5 []  - 0 Complex Wound Cleansing - multiple wounds X- 1 5 Wound Imaging (photographs - any number of wounds) []  - 0 Wound Tracing (instead of photographs) X- 1 5 Simple  Wound Measurement - one wound []  - 0 Complex Wound Measurement - multiple wounds INTERVENTIONS - Wound  Dressings []  - Small Wound Dressing one or multiple wounds 0 X- 1 15 Medium Wound Dressing one or multiple wounds []  - 0 Large Wound Dressing one or multiple wounds []  - 0 Application of Medications - topical []  - 0 Application of Medications - injection INTERVENTIONS - Miscellaneous []  - External ear exam 0 []  - 0 Specimen Collection (cultures, biopsies, blood, body fluids, etc.) []  - 0 Specimen(s) / Culture(s) sent or taken to Lab for analysis []  - 0 Patient Transfer (multiple staff / Lift / Similar devices) []  - 0 Simple Staple / Suture removal (25 or less) []  - 0 Complex Staple / Suture removal (26 or more) []  - 0 Hypo / Hyperglycemic Management (close monitor of Blood Glucose) []  - 0 Ankle / Brachial Index (ABI) - do not check if billed separately X- 1 5 Vital Signs [name], [name] ( ) Has the patient been seen at the hospital within the last three years: Yes Total Score: 80 Level Of Care: New/Established - Level 3 Electronic Signature(s) Signed: 07/22/2019 4:13:40 PM By: Entered By: on 07/22/2019 14:08:17 (Michiel Sites) -------------------------------------------------------------------------------- Encounter Discharge Information Details Patient Name: Date of Service: 07/22/2019 1:30 PM Medical Record Number: Patient Account Number: Date of Birth/Sex: 26-May-1930 (85 y.o. F) Treating RN: 09/21/2019 Primary Care Jerris Keltz: Rodell Perna Other Clinician: Referring Meshilem Machuca: Rodell Perna Treating Keiaira Donlan/Extender: 09/21/2019, HOYT Weeks in Treatment: 3 Encounter Discharge Information Items Discharge Condition: Stable Ambulatory Status: Wheelchair Discharge Destination: Home Transportation: Private Auto Accompanied By: family Schedule Follow-up Appointment:  Yes Clinical Summary of Care: Electronic Signature(s) Signed: 07/22/2019 4:13:40 PM By: [id number] Entered By: [name] on 07/22/2019 14:07:34 [id number] ([id number]) -------------------------------------------------------------------------------- Wound Assessment Details Patient Name: 13/05/1930 Date of Service: 07/22/2019 1:30 PM Medical Record Number: Rodell Perna Patient Account Number: Aram Beecham Date of Birth/Sex: 23-Jun-1930 (85 y.o. F) Treating RN: 09/21/2019 Primary Care Chiante Peden: Rodell Perna Other Clinician: Referring Enza Shone: Rodell Perna Treating Remmi Armenteros/Extender: 09/21/2019, HOYT Weeks in Treatment: 3 Wound Status Wound Number: 11 Primary Skin Tear Etiology: Wound Location: Right Lower Leg - Anterior Wound Open Wounding Event: Trauma Status: Date Acquired: 06/27/2019 Comorbid Cataracts, Glaucoma, Optic Neuritis, Chronic Weeks Of Treatment: 3 History: sinus problems/congestion, Middle ear Clustered Wound: No problems Wound Measurements Length: (cm) 3.2 Width: (cm) 4.5 Depth: (cm) 0.1 Area: (cm) 11.31 Volume: (cm) 1.131 % Reduction in Area: -5.9% % Reduction in Volume: 78.8% Epithelialization: None Tunneling: No Undermining: No Wound Description Classification: Partial Thickness Wound Margin: Flat and Intact Exudate Amount: Medium Exudate Type: Sanguinous Exudate Color: red Foul Odor After Cleansing: No Slough/Fibrino Yes Wound Bed Granulation Amount: Medium (34-66%) Exposed Structure Granulation Quality: Red Fascia Exposed: No Necrotic Amount: Medium (34-66%) Fat Layer (Subcutaneous Tissue) Exposed: Yes Necrotic Quality: Eschar, Adherent Slough Tendon Exposed: No Muscle Exposed: No Joint Exposed: No Bone Exposed: No Treatment Notes Wound #11 (Right, Anterior Lower Leg) Notes silver collage, xtrasorb, tubi grip Electronic Signature(s) Signed: 07/22/2019 4:13:40 PM By: [name] Entered By: 09/21/2019 on 07/22/2019  14:00:45

## 2019-07-23 ENCOUNTER — Ambulatory Visit: Payer: Medicare Other

## 2019-07-25 ENCOUNTER — Encounter: Payer: Medicare Other | Admitting: Physician Assistant

## 2019-07-25 ENCOUNTER — Other Ambulatory Visit: Payer: Self-pay

## 2019-07-25 DIAGNOSIS — S81811A Laceration without foreign body, right lower leg, initial encounter: Secondary | ICD-10-CM | POA: Diagnosis not present

## 2019-07-25 NOTE — Progress Notes (Signed)
[name], [name] ([id number]) Visit Report for 07/25/2019 Arrival Information Details Patient Name: [name], [name] Date of Service: 07/25/2019 1:00 PM Medical Record Number: [id number] Patient Account Number: [id number] Date of Birth/Sex: 1930-04-26 (85 y.o. F) Treating RN: Army Melia Primary Care Basilio Meadow: Fulton Reek Other Clinician: Referring Tysheena Ginzburg: Fulton Reek Treating Terrion Gencarelli/Extender: Melburn Hake, HOYT Weeks in Treatment: 3 Visit Information History Since Last Visit Added or deleted any medications: No Patient Arrived: [place] Any new allergies or adverse reactions: No Arrival Time: 13:16 Had a fall or experienced change in No Accompanied By: self activities of daily living that may affect Transfer Assistance: None risk of falls: Patient Identification Verified: Yes Signs or symptoms of abuse/neglect since last visito No Secondary Verification Process Completed: Yes Hospitalized since last visit: No Implantable device outside of the clinic excluding No cellular tissue based products placed in the center since last visit: Has Dressing in Place as Prescribed: Yes Pain Present Now: No Electronic Signature(s) Signed: 07/25/2019 4:02:39 PM By: Lorine Bears RCP, RRT, CHT Entered By: Lorine Bears on 07/25/2019 13:18:37 [name] ([id number]) -------------------------------------------------------------------------------- Lower Extremity Assessment Details Patient Name: [name] Date of Service: 07/25/2019 1:00 PM Medical Record Number: [id number] Patient Account Number: [id number] Date of Birth/Sex: 06/04/1930 (85 y.o. F) Treating RN: Montey Hora Primary Care Josefina Rynders: Fulton Reek Other Clinician: Referring Rhyland Hinderliter: Fulton Reek Treating Scorpio Fortin/Extender: Melburn Hake, HOYT Weeks in Treatment: 3 Edema Assessment Assessed: [Left: No] [Right: No] Edema: [Left: Ye] [Right: s] Calf Left: Right: Point of  Measurement: 30 cm From Medial Instep cm 34.5 cm Ankle Left: Right: Point of Measurement: 10 cm From Medial Instep cm 21.5 cm Vascular Assessment Pulses: Dorsalis Pedis Palpable: [Right:Yes] Electronic Signature(s) Signed: 07/25/2019 4:45:21 PM By: Montey Hora Entered By: Montey Hora on 07/25/2019 13:22:29 [name] ([id number]) -------------------------------------------------------------------------------- Multi Wound Chart Details Patient Name: [name] Date of Service: 07/25/2019 1:00 PM Medical Record Number: [id number] Patient Account Number: [id number] Date of Birth/Sex: 12-11-1930 (85 y.o. F) Treating RN: Army Melia Primary Care Gwynn Chalker: Fulton Reek Other Clinician: Referring Dodge Ator: Fulton Reek Treating Mohmmad Saleeby/Extender: Melburn Hake, HOYT Weeks in Treatment: 3 Vital Signs Height(in): 60 Pulse(bpm): 70 Weight(lbs): 125 Blood Pressure(mmHg): 139/52 Body Mass Index(BMI): 24 Temperature(F): 98.3 Respiratory Rate 18 (breaths/min): Photos: [N/A:N/A] Wound Location: Right Lower Leg - Anterior N/A N/A Wounding Event: Trauma N/A N/A Primary Etiology: Skin Tear N/A N/A Comorbid History: Cataracts, Glaucoma, Optic N/A N/A Neuritis, Chronic sinus problems/congestion, Middle ear problems Date Acquired: 06/27/2019 N/A N/A Weeks of Treatment: 3 N/A N/A Wound Status: Open N/A N/A Measurements L x W x D 3.2x4x0.2 N/A N/A (cm) Area (cm) : 10.053 N/A N/A Volume (cm) : 2.011 N/A N/A % Reduction in Area: 5.90% N/A N/A % Reduction in Volume: 62.30% N/A N/A Classification: Partial Thickness N/A N/A Exudate Amount: Medium N/A N/A Exudate Type: Sanguinous N/A N/A Exudate Color: red N/A N/A Wound Margin: Flat and Intact N/A N/A Granulation Amount: Medium (34-66%) N/A N/A Granulation Quality: Red N/A N/A Necrotic Amount: Medium (34-66%) N/A N/A Necrotic Tissue: Eschar, Adherent Slough N/A N/A Exposed Structures: Fat Layer (Subcutaneous N/A  N/A Tissue) Exposed: Yes Fascia: No Tendon: No [name], [name] ([id number]) Muscle: No Joint: No Bone: No Epithelialization: None N/A N/A Treatment Notes Electronic Signature(s) Signed: 07/25/2019 4:39:13 PM By: Army Melia Entered By: Army Melia on 07/25/2019 13:38:01 [name] ([id number]) -------------------------------------------------------------------------------- [place] Plan Details Patient Name: [name] Date of Service: 07/25/2019 1:00 PM Medical Record Number: [id number] Patient Account Number:  [id number] Date of Birth/Sex: Sep 08, 1930 (85 y.o. F) Treating RN: Rodell Perna Primary Care Katheryn Culliton: Aram Beecham Other Clinician: Referring Rozanna Cormany: Aram Beecham Treating Lennis Rader/Extender: Linwood Dibbles, HOYT Weeks in Treatment: 3 Active Inactive Abuse / Safety / Falls / Self Care Management Nursing Diagnoses: Potential for falls Goals: Patient will remain injury free related to falls Date Initiated: 06/28/2019 Target Resolution Date: 09/21/2019 Goal Status: Active Interventions: Assess fall risk on admission and as needed Notes: Wound/Skin Impairment Nursing Diagnoses: Impaired tissue integrity Goals: Ulcer/skin breakdown will heal within 14 weeks Date Initiated: 06/28/2019 Target Resolution Date: 09/21/2019 Goal Status: Active Interventions: Assess patient/caregiver ability to obtain necessary supplies Assess patient/caregiver ability to perform ulcer/skin care regimen upon admission and as needed Assess ulceration(s) every visit Notes: Electronic Signature(s) Signed: 07/25/2019 4:39:13 PM By: Rodell Perna Entered By: Rodell Perna on 07/25/2019 13:37:52 [name] ([id number]) -------------------------------------------------------------------------------- Pain Assessment Details Patient Name: [name] Date of Service: 07/25/2019 1:00 PM Medical Record Number: [id number] Patient Account Number: [id number] Date  of Birth/Sex: November 22, 1930 (85 y.o. F) Treating RN: Rodell Perna Primary Care Zayne Draheim: Aram Beecham Other Clinician: Referring Namiyah Grantham: Aram Beecham Treating Lorri Fukuhara/Extender: Linwood Dibbles, HOYT Weeks in Treatment: 3 Active Problems Location of Pain Severity and Description of Pain Patient Has Paino No Site Locations Pain Management and Medication Current Pain Management: Electronic Signature(s) Signed: 07/25/2019 4:02:39 PM By: Sallee Provencal, RRT, CHT Signed: 07/25/2019 4:39:13 PM By: Rodell Perna Entered By: Dayton Martes on 07/25/2019 13:18:46 [name] ([id number]) -------------------------------------------------------------------------------- Patient/Caregiver Education Details Patient Name: [name] Date of Service: 07/25/2019 1:00 PM Medical Record Number: [id number] Patient Account Number: [id number] Date of Birth/Gender: Jul 11, 1930 (85 y.o. F) Treating RN: Rodell Perna Primary Care Physician: Aram Beecham Other Clinician: Referring Physician: Aram Beecham Treating Physician/Extender: Skeet Simmer in Treatment: 3 Education Assessment Education Provided To: Patient Education Topics Provided Wound/Skin Impairment: Handouts: Caring for Your Ulcer Methods: Demonstration, Explain/Verbal Responses: State content correctly Electronic Signature(s) Signed: 07/25/2019 4:39:13 PM By: Rodell Perna Entered By: Rodell Perna on 07/25/2019 13:53:40 [name] ([id number]) -------------------------------------------------------------------------------- Wound Assessment Details Patient Name: [name] Date of Service: 07/25/2019 1:00 PM Medical Record Number: [id number] Patient Account Number: [id number] Date of Birth/Sex: 19-Aug-1930 (85 y.o. F) Treating RN: Curtis Sites Primary Care Shaira Sova: Aram Beecham Other Clinician: Referring Lilou Kneip: Aram Beecham Treating Karel Mowers/Extender: Linwood Dibbles,  HOYT Weeks in Treatment: 3 Wound Status Wound Number: [id number] Primary Skin Tear Etiology: Wound Location: Right Lower Leg - Anterior Wound Open Wounding Event: Trauma Status: Date Acquired: 06/27/2019 Comorbid Cataracts, Glaucoma, Optic Neuritis, Chronic Weeks Of Treatment: 3 History: sinus problems/congestion, Middle ear problems Clustered Wound: No Photos Wound Measurements Length: (cm) 3.2 % Reduction Width: (cm) 4 % Reduction Depth: (cm) 0.2 Epithelializ Area: (cm) 10.053 Tunneling: Volume: (cm) 2.011 Undermining in Area: 5.9% in Volume: 62.3% ation: None No : No Wound Description Classification: Partial Thickness Foul Odor A Wound Margin: Flat and Intact Slough/Fibr Exudate Amount: Medium Exudate Type: Sanguinous Exudate Color: red fter Cleansing: No ino Yes Wound Bed Granulation Amount: Medium (34-66%) Exposed Structure Granulation Quality: Red Fascia Exposed: No Necrotic Amount: Medium (34-66%) Fat Layer (Subcutaneous Tissue) Exposed: Yes Necrotic Quality: Eschar, Adherent Slough Tendon Exposed: No Muscle Exposed: No Joint Exposed: No Bone Exposed: No Electronic Signature(s) SHAUNTE, TUFT ([id number]) Signed: 07/25/2019 4:45:21 PM By: Curtis Sites Entered By: Curtis Sites on 07/25/2019 13:26:10 [name] ([id number]) -------------------------------------------------------------------------------- Vitals Details Patient Name: [name] Date of Service: 07/25/2019 1:00 PM Medical Record Number:  [id number] Patient Account Number: [id number] Date of Birth/Sex: 12/30/1929 (85 y.o. F) Treating RN: Rodell PernaScott, Dajea Primary Care Mariel Gaudin: Aram BeechamSparks, Jeffrey Other Clinician: Referring Sydell Prowell: Aram BeechamSparks, Jeffrey Treating Ronette Hank/Extender: Linwood DibblesSTONE III, HOYT Weeks in Treatment: 3 Vital Signs Time Taken: 13:18 Temperature (F): 98.3 Height (in): 60 Pulse (bpm): 70 Weight (lbs): 125 Respiratory Rate (breaths/min): 18 Body Mass Index (BMI): 24.4 Blood  Pressure (mmHg): 139/52 Reference Range: 80 - 120 mg / dl Electronic Signature(s) Signed: 07/25/2019 4:02:39 PM By: Dayton MartesWallace, RCP,RRT,CHT, Sallie RCP, RRT, CHT Entered By: Dayton MartesWallace, RCP,RRT,CHT, Sallie on 07/25/2019 13:19:55

## 2019-07-25 NOTE — Progress Notes (Addendum)
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 noticed some redness increasing pain and she came in early. She reports the same amount of drainage. We have been using endoform. She is approved through puraply however I will only consider starting that next week 1/22; she completed the antibiotics last week. Culture I did was negative. In spite of this there is less erythema and pain complaints in the wound. Puraply #1 applied today 1/29; Puraply #2 today. Wound surface looks a lot better post debridement of adherent fibrinous material.  However undermining from 6-12 is measuring worse 2/5; Puraply #3. Using her elbow brace 2/12 puraply #4 2/19 puraply #5. The 9:00 undermining measured at 0.5 cm. Undermining from 4-11 o'clock. Surface of the wound looks better and the circumference of the wound is smaller however the undermining is not really changed 2/26; still not much improvement. She has undermining from 4-9 o'clock 0.9 cm. Surface of the wound covered and adherent debris. 3/4; still no improvement. Undermining from 4-9 o'clock still around a centimeter. Surface of the wound looks somewhat better. No debridement is required we used endoform after we ended the trial of puraply last week 3/11; really no improvement at all. Still 1 cm undermining from roughly 9-3 o'clock. This is about a centimeter. The base of the wound looks fairly healthy. No debridement. We have been using endoform. I am really out of most usual options here. I could consider either another round of an amniotic advanced treatment product example epifix or perhaps regranex. Understandably the patient is a bit frustrated. We did send her to plastic surgery for a consult. Other than prescribing her a brace to immobilize the elbow they did not think she was a candidate for any further surgery. Notable that the patient is not using the brace today 3/18-Patient returns for attention to the left elbow area which apparently looked red at the home health visit. Patient's elbow looks the same if not better compared with last visit. The area of ulceration remains the same, the base appears healthy. We are continuing to use endoform she has been encouraged to use the brace to keep the elbow straight 3/25; the patient has an appointment at the [facility] wound care center on 4/2. I had actually put her out indefinitely however she seems to want to come back here every week. This week she complains of increased pain and malodor. Dimensions of the elbow wound are larger. We  have been using endoform 4/8; the patient went to [facility] where they apparently gave her meta honey and some border foam with a Tubigrip. She has been using this for a week. She says she was very impressed with them there. They did not offer her any surgical consultation. She seems to be coming back here for a second opinion on this, she does not wish to drive  to [facility] every week 4/22; still using Medihoney foam border and a Tubigrip. Actually do not think she has anything on the arm at all specifically she is not using her brace 5/6; she has been using medihoney without a lot of improvement. Undermining maximum at 9:00 at 0.5 cm may be somewhat better. She is still complaining of discomfort. She uses her elbow brace at night but is not using anything on the arm during the day, she finds it too restrictive 5/13; we switch the patient to endoform AG last week. She is complaining of more pain and worried about some circumferential erythema around the wound. I had planned to consider epifix in this wound however the patient came in with a request to see a plastic surgeon in [place] by the name of Dr. Wyline Mood who cared for a friend of hers. Noteworthy that I have already sent her to one plastic surgeon and she went for another second opinion at [facility] and they apparently did not send her to a plastic surgeon nevertheless the patient is fairly convinced that she might benefit from a skin graft which I am doubtful. She also has rheumatoid arthritis and is on methotrexate. This was originally a surgical wound for a bursectomy a year or 2 ago Readmission: 06/28/19 on evaluation today patient presents today for reevaluation but this is due to a new issue her elbow has completely healed and looks excellent. Her right anterior lower leg has a skin tear which was sustained from her dog who jumped up on her and inadvertently scratch the area causing the skin tear. This happened yesterday. She is having some  discomfort but fortunately nothing too significant which is good news. No fevers, chills, nausea, or vomiting noted at this time. The skin fortunately was knocked one completely off and we are gonna see about we approximate in the skin as best we can in using Steri-Strips to hold this in place obviously if we can get some of this to reattach that would be beneficial AMEIRA, ALESSANDRINI. ([id number]) 07/05/2019 on evaluation today patient actually appears to be doing okay although unfortunately the skin flap that we were attempting to Steri-Stripped down last week did not take. She is developing a lot of fluid underneath the wound area unfortunately which again is not ideal. I think that this necrotic tissue needs to be removed and again it actually just wiped off during the evaluation today as I was attempting to clean the wound there did not appear to be any significant issues underlying which is good although there was some purulent drainage I did want to go ahead and see about obtaining a culture from today in order to ensure that the Z-Pak that I placed her on earlier in the week was appropriate for treating what ever infection may be causing the issue currently. This was a deep wound culture obtained today. 07/12/2019 on evaluation today patient actually appears to be doing quite well with regard to her right lower extremity ulcer all things considering. There is still little bit of hematoma not it that is noted in the central portion of the wound along with some necrotic tissue but she is still having quite a bit of discomfort. For that reason I did not perform sharp debridement today although this wound does need some debridement of one type or another. I think we may attempt Iodoflex to see if this can be of benefit. Fortunately there is no signs of active infection at this point. 07/18/19 upon evaluation  today patient appears to be doing better with regard to her ulcer on her right lower extremity.  She's been tolerating the dressing changes without complication. The good news is she seems to be making excellent progress. Overall I'm pleased with there being no signs of infection. With that being said she does tell me that she's been having some discomfort with the wrap she's unsure of exactly what about the rafters causing her trouble. 07/25/2019 on evaluation today patient appears to be doing a little better in regard to her lower extremity ulcer. Unfortunately the alginate seems to be getting really stuck to the wound bed and surrounding periwound. Fortunately there is no signs of active infection at this time. There does not appear to be any evidence of infection currently. Patient History Information obtained from Patient. Family History Heart Disease - Siblings,Father, No family history of Cancer, Diabetes, Hereditary Spherocytosis, Hypertension, Kidney Disease, Lung Disease, Seizures, Stroke, Thyroid Problems, Tuberculosis. Social History Never smoker, Alcohol Use - Daily, Drug Use - No History, Caffeine Use - Never. Medical History Eyes Patient has history of Cataracts, Glaucoma, Optic Neuritis Ear/Nose/Mouth/Throat Patient has history of Chronic sinus problems/congestion, Middle ear problems Medical And Surgical History Notes Constitutional Symptoms (General Health) Back pain Ear/Nose/Mouth/Throat bilateral hearing aides Review of Systems (ROS) Constitutional Symptoms (General Health) Denies complaints or symptoms of Fatigue, Fever, Chills, Marked Weight Change. Respiratory Denies complaints or symptoms of Chronic or frequent coughs, Shortness of Breath. Cardiovascular Denies complaints or symptoms of Chest pain, LE edema. Psychiatric Denies complaints or symptoms of Anxiety, Claustrophobia. [place], VOHS ([id number]) Objective Constitutional Well-nourished and well-hydrated in no acute distress. Vitals Time Taken: 1:18 PM, Height: 60 in, Weight: 125 lbs, BMI:  24.4, Temperature: 98.3 F, Pulse: 70 bpm, Respiratory Rate: 18 breaths/min, Blood Pressure: 139/52 mmHg. Respiratory normal breathing without difficulty. Psychiatric this patient is able to make decisions and demonstrates good insight into disease process. Alert and Oriented x 3. pleasant and cooperative. General Notes: Upon inspection patient's wound bed again did show some slough noted on the surface of the wound I was able to clean away some of the slough as well as the dry dressing around the edge as well as some of the necrotic tissue in the central portion of the wound she tolerated this with some discomfort however. Fortunately post debridement however she seems to be doing much better she was not having as much pain I did put a little lidocaine on post debridement as well. Integumentary (Hair, Skin) Wound #11 status is Open. Original cause of wound was Trauma. The wound is located on the Right,Anterior Lower Leg. The wound measures 3.2cm length x 4cm width x 0.2cm depth; 10.053cm^2 area and 2.011cm^3 volume. There is Fat Layer (Subcutaneous Tissue) Exposed exposed. There is no tunneling or undermining noted. There is a medium amount of sanguinous drainage noted. The wound margin is flat and intact. There is medium (34-66%) red granulation within the wound bed. There is a medium (34-66%) amount of necrotic tissue within the wound bed including Eschar and Adherent Slough. Assessment Active Problems ICD-10 Unspecified open wound, right lower leg, initial encounter Non-pressure chronic ulcer of other part of right lower leg with fat layer exposed Struck by dog, initial encounter Procedures Wound #11 Pre-procedure diagnosis of Wound #11 is a Skin Tear located on the Right,Anterior Lower Leg . There was a Excisional Skin/Subcutaneous Tissue Debridement with a total area of 12.8 sq cm performed by STONE III, Garry Nicolini E., PA-C. With the following instrument(s): Curette Material  removed  includes Eschar, Subcutaneous Tissue, Slough, and Other: dressing [name], [name] ([id number]) material. A time out was conducted at 13:41, prior to the start of the procedure. There was no bleeding. The procedure was tolerated well. Post Debridement Measurements: 3.2cm length x 4cm width x 0.2cm depth; 2.011cm^3 volume. Character of Wound/Ulcer Post Debridement is stable. Post procedure Diagnosis Wound #11: Same as Pre-Procedure Plan Wound Cleansing: Wound #11 Right,Anterior Lower Leg: Clean wound with Normal Saline. May shower with protection. - Please do not get your wrap wet Primary Wound Dressing: Wound #11 Right,Anterior Lower Leg: Santyl Ointment Secondary Dressing: Wound #11 Right,Anterior Lower Leg: ABD pad Saline moistened gauze Other - stretch net on top Dressing Change Frequency: Wound #11 Right,Anterior Lower Leg: Change dressing every week Follow-up Appointments: Wound #11 Right,Anterior Lower Leg: Return Appointment in 1 week. Nurse Visit as needed Edema Control: Wound #11 Right,Anterior Lower Leg: Other: - tubi grip F over stretch net The following medication(s) was prescribed: Santyl topical 250 unit/gram ointment ointment topical Apply nickel thick to the wound bed and then cover with a dressing as directed in clinic. starting 07/25/2019 1. I would recommend that we go ahead and switch the patient's dressing to Santyl I think this will be better for her as far as helping loosen up the necrotic tissue. 2. So that she does not have to wrap anything around this we will use stretch netting and subsequently an 8 to secure the Santyl and moistened saline gauze in place. 3. We will continue with the Tubigrip since this seems to be helping and is not as tight as the 3 layer compression wrap she still did not really want to do that but I think she needs to have something on her leg. 4. I recommend she continue to elevate her legs as much as possible. We will see patient  back for reevaluation in 1 week here in the clinic. If anything worsens or changes patient will contact our office for additional recommendations. Electronic Signature(s) Signed: 07/25/2019 2:14:29 PM By: Lenda Kelp PA-C Previous Signature: 07/25/2019 2:13:44 PM Version By: Lenda Kelp PA-C Entered By: Lenda Kelp on 07/25/2019 14:14:29 [name] ([id number]) [name], [name] ([id number]) -------------------------------------------------------------------------------- ROS/PFSH Details Patient Name: [name] Date of Service: 07/25/2019 1:00 PM Medical Record Number: [id number] Patient Account Number: [id number] Date of Birth/Sex: November 01, 1930 (85 y.o. F) Treating RN: Rodell Perna Primary Care Provider: Aram Beecham Other Clinician: Referring Provider: Aram Beecham Treating Provider/Extender: Linwood Dibbles, Tiegan Terpstra Weeks in Treatment: 3 Information Obtained From Patient Constitutional Symptoms (General Health) Complaints and Symptoms: Negative for: Fatigue; Fever; Chills; Marked Weight Change Medical History: Past Medical History Notes: Back pain Respiratory Complaints and Symptoms: Negative for: Chronic or frequent coughs; Shortness of Breath Cardiovascular Complaints and Symptoms: Negative for: Chest pain; LE edema Psychiatric Complaints and Symptoms: Negative for: Anxiety; Claustrophobia Eyes Medical History: Positive for: Cataracts; Glaucoma; Optic Neuritis Ear/Nose/Mouth/Throat Medical History: Positive for: Chronic sinus problems/congestion; Middle ear problems Past Medical History Notes: bilateral hearing aides HBO Extended History Items Ear/Nose/Mouth/Throat: Eyes: Eyes: Ear/Nose/Mouth/Throat: Chronic sinus Cataracts Glaucoma Middle ear problems problems/congestion Immunizations Pneumococcal Vaccine: Received Pneumococcal Vaccination: No Implantable Devices [name], [name] ([id number]) Yes Family and Social History Cancer: No;  Diabetes: No; Heart Disease: Yes - Siblings,Father; Hereditary Spherocytosis: No; Hypertension: No; Kidney Disease: No; Lung Disease: No; Seizures: No; Stroke: No; Thyroid Problems: No; Tuberculosis: No; Never smoker; Alcohol Use: Daily; Drug Use: No History; Caffeine Use: Never; Financial Concerns: No; Food, Clothing or Shelter  Needs: No; Support System Lacking: No; Transportation Concerns: No Physician Affirmation I have reviewed and agree with the above information. Electronic Signature(s) Signed: 07/25/2019 4:39:13 PM By: Rodell Perna Signed: 07/25/2019 10:42:11 PM By: Lenda Kelp PA-C Entered By: Lenda Kelp on 07/25/2019 13:59:16 [name] ([id number]) -------------------------------------------------------------------------------- SuperBill Details Patient Name: [name] Date of Service: 07/25/2019 Medical Record Number: [id number] Patient Account Number: [id number] Date of Birth/Sex: 09-Sep-1930 (85 y.o. F) Treating RN: Rodell Perna Primary Care Provider: Aram Beecham Other Clinician: Referring Provider: Aram Beecham Treating Provider/Extender: Linwood Dibbles, Tylynn Braniff Weeks in Treatment: 3 Diagnosis Coding ICD-10 Codes Code Description S81.801A Unspecified open wound, right lower leg, initial encounter L97.812 Non-pressure chronic ulcer of other part of right lower leg with fat layer exposed W54.1XXA Struck by dog, initial encounter Facility Procedures CPT4 Code Description: [id number] [phone] - DEB SUBQ TISSUE 20 SQ CM/< ICD-10 Diagnosis Description L97.812 Non-pressure chronic ulcer of other part of right lower leg wit S81.801A Unspecified open wound, right lower leg, initial encounter Modifier: h fat layer expo Quantity: 1 sed Physician Procedures CPT4 Code Description: [id number] - WC PHYS SUBQ TISS 20 SQ CM ICD-10 Diagnosis Description L97.812 Non-pressure chronic ulcer of other part of right lower leg wit S81.801A Unspecified open wound, right lower leg,  initial encounter Modifier: h fat layer expo Quantity: 1 sed Electronic Signature(s) Signed: 07/25/2019 2:14:39 PM By: Lenda Kelp PA-C Entered By: Lenda Kelp on 07/25/2019 14:14:38

## 2019-07-30 ENCOUNTER — Ambulatory Visit: Payer: Medicare Other

## 2019-08-01 ENCOUNTER — Other Ambulatory Visit: Payer: Self-pay

## 2019-08-01 ENCOUNTER — Encounter: Payer: Medicare Other | Admitting: Physician Assistant

## 2019-08-01 DIAGNOSIS — S81811A Laceration without foreign body, right lower leg, initial encounter: Secondary | ICD-10-CM | POA: Diagnosis not present

## 2019-08-01 NOTE — Progress Notes (Addendum)
[name], [name] ([id number]) Visit Report for 08/01/2019 Arrival Information Details Patient Name: [name], [name]. Date of Service: 08/01/2019 1:00 PM Medical Record Number: [id number] Patient Account Number: [id number] Date of Birth/Sex: 1930/09/28 (85 y.o. F) Treating RN: Army Melia Primary Care Regie Bunner: Fulton Reek Other Clinician: Referring Sydell Prowell: Fulton Reek Treating Arnetia Bronk/Extender: Melburn Hake, HOYT Weeks in Treatment: 4 Visit Information History Since Last Visit Added or deleted any medications: No Patient Arrived: Walker Any new allergies or adverse reactions: No Arrival Time: 13:04 Had a fall or experienced change in No Accompanied By: son-Eric activities of daily living that may affect Transfer Assistance: None risk of falls: Patient Identification Verified: Yes Signs or symptoms of abuse/neglect since last visito No Secondary Verification Process Completed: Yes Hospitalized since last visit: No Implantable device outside of the clinic excluding No cellular tissue based products placed in the center since last visit: Has Dressing in Place as Prescribed: Yes Pain Present Now: No Electronic Signature(s) Signed: 08/01/2019 3:50:31 PM By: Lorine Bears RCP, RRT, CHT Entered By: Lorine Bears on 08/01/2019 13:07:07 [name] ([id number]) -------------------------------------------------------------------------------- Encounter Discharge Information Details Patient Name: [name] Date of Service: 08/01/2019 1:00 PM Medical Record Number: [id number] Patient Account Number: [id number] Date of Birth/Sex: [birth year] (85 y.o. F) Treating RN: Harold Barban Primary Care Rie Mcneil: Fulton Reek Other Clinician: Referring Jionni Helming: Fulton Reek Treating Azlyn Wingler/Extender: Melburn Hake, HOYT Weeks in Treatment: 4 Encounter Discharge Information Items Post Procedure Vitals Discharge Condition: Stable Temperature (F):  98.1 Ambulatory Status: Walker Pulse (bpm): 73 Discharge Destination: Home Respiratory Rate (breaths/min): 16 Transportation: Private Auto Blood Pressure (mmHg): 145/36 Accompanied By: self Schedule Follow-up Appointment: Yes Clinical Summary of Care: Electronic Signature(s) Signed: 08/01/2019 4:24:46 PM By: Harold Barban Entered By: Harold Barban on 08/01/2019 14:10:10 [name] ([id number]) -------------------------------------------------------------------------------- Lower Extremity Assessment Details Patient Name: [name] Date of Service: 08/01/2019 1:00 PM Medical Record Number: [id number] Patient Account Number: [id number] Date of Birth/Sex: 02/18/1930 (85 y.o. F) Treating RN: Montey Hora Primary Care Micayla Brathwaite: Fulton Reek Other Clinician: Referring Tasnia Spegal: Fulton Reek Treating Joelene Barriere/Extender: Melburn Hake, HOYT Weeks in Treatment: 4 Edema Assessment Assessed: [Left: No] [Right: No] Edema: [Left: Ye] [Right: s] Calf Left: Right: Point of Measurement: 30 cm From Medial Instep cm 33.7 cm Ankle Left: Right: Point of Measurement: 10 cm From Medial Instep cm 22 cm Vascular Assessment Pulses: Dorsalis Pedis Palpable: [Right:Yes] Electronic Signature(s) Signed: 08/01/2019 4:23:33 PM By: Montey Hora Entered By: Montey Hora on 08/01/2019 13:13:51 [name] ([id number]) -------------------------------------------------------------------------------- Multi Wound Chart Details Patient Name: [name] Date of Service: 08/01/2019 1:00 PM Medical Record Number: [id number] Patient Account Number: [id number] Date of Birth/Sex: January 29, 1930 (85 y.o. F) Treating RN: Army Melia Primary Care Arlesia Kiel: Fulton Reek Other Clinician: Referring Abel Hageman: Fulton Reek Treating Magdalen Cabana/Extender: Melburn Hake, HOYT Weeks in Treatment: 4 Vital Signs Height(in): 60 Pulse(bpm): 73 Weight(lbs): 125 Blood Pressure(mmHg): 145/36 Body Mass  Index(BMI): 24 Temperature(F): 98.1 Respiratory Rate 18 (breaths/min): Photos: [N/A:N/A] Wound Location: Right Lower Leg - Anterior N/A N/A Wounding Event: Trauma N/A N/A Primary Etiology: Skin Tear N/A N/A Comorbid History: Cataracts, Glaucoma, Optic N/A N/A Neuritis, Chronic sinus problems/congestion, Middle ear problems Date Acquired: 06/27/2019 N/A N/A Weeks of Treatment: 4 N/A N/A Wound Status: Open N/A N/A Measurements L x W x D 3.2x3.7x0.2 N/A N/A (cm) Area (cm) : 9.299 N/A N/A Volume (cm) : 1.86 N/A N/A % Reduction in Area: 12.90% N/A N/A % Reduction in Volume: 65.20% N/A N/A Classification: Partial Thickness N/A N/A  Exudate Amount: Medium N/A N/A Exudate Type: Sanguinous N/A N/A Exudate Color: red N/A N/A Wound Margin: Flat and Intact N/A N/A Granulation Amount: Medium (34-66%) N/A N/A Granulation Quality: Red N/A N/A Necrotic Amount: Medium (34-66%) N/A N/A Necrotic Tissue: Eschar, Adherent Slough N/A N/A Exposed Structures: Fat Layer (Subcutaneous N/A N/A Tissue) Exposed: Yes Fascia: No Tendon: No [name], [name] ([id number]) Muscle: No Joint: No Bone: No Epithelialization: None N/A N/A Treatment Notes Electronic Signature(s) Signed: 08/01/2019 4:30:40 PM By: Rodell Perna Entered By: Rodell Perna on 08/01/2019 13:46:06 [name] ([id number]) -------------------------------------------------------------------------------- Multi-Disciplinary Care Plan Details Patient Name: [name] Date of Service: 08/01/2019 1:00 PM Medical Record Number: [id number] Patient Account Number: [id number] Date of Birth/Sex: 08-12-1930 (85 y.o. F) Treating RN: Rodell Perna Primary Care Kayshaun Polanco: Aram Beecham Other Clinician: Referring Marella Vanderpol: Aram Beecham Treating Parker Wherley/Extender: Linwood Dibbles, HOYT Weeks in Treatment: 4 Active Inactive Abuse / Safety / Falls / Self Care Management Nursing Diagnoses: Potential for falls Goals: Patient will remain  injury free related to falls Date Initiated: 06/28/2019 Target Resolution Date: 09/21/2019 Goal Status: Active Interventions: Assess fall risk on admission and as needed Notes: Wound/Skin Impairment Nursing Diagnoses: Impaired tissue integrity Goals: Ulcer/skin breakdown will heal within 14 weeks Date Initiated: 06/28/2019 Target Resolution Date: 09/21/2019 Goal Status: Active Interventions: Assess patient/caregiver ability to obtain necessary supplies Assess patient/caregiver ability to perform ulcer/skin care regimen upon admission and as needed Assess ulceration(s) every visit Notes: Electronic Signature(s) Signed: 08/01/2019 4:30:40 PM By: Rodell Perna Entered By: Rodell Perna on 08/01/2019 13:45:30 [name] ([id number]) -------------------------------------------------------------------------------- Pain Assessment Details Patient Name: [name] Date of Service: 08/01/2019 1:00 PM Medical Record Number: [id number] Patient Account Number: [id number] Date of Birth/Sex: March 05, 1930 (85 y.o. F) Treating RN: Rodell Perna Primary Care Domonik Levario: Aram Beecham Other Clinician: Referring Jalayah Gutridge: Aram Beecham Treating Nahia Nissan/Extender: Linwood Dibbles, HOYT Weeks in Treatment: 4 Active Problems Location of Pain Severity and Description of Pain Patient Has Paino No Site Locations Pain Management and Medication Current Pain Management: Electronic Signature(s) Signed: 08/01/2019 3:50:31 PM By: Sallee Provencal, RRT, CHT Signed: 08/01/2019 4:30:40 PM By: Rodell Perna Entered By: Dayton Martes on 08/01/2019 13:07:16 [name] ([id number]) -------------------------------------------------------------------------------- Patient/Caregiver Education Details Patient Name: [name] Date of Service: 08/01/2019 1:00 PM Medical Record Number: [id number] Patient Account Number: [id number] Date of Birth/Gender: 1930/01/30 (85 y.o.  F) Treating RN: Rodell Perna Primary Care Physician: Aram Beecham Other Clinician: Referring Physician: Aram Beecham Treating Physician/Extender: Skeet Simmer in Treatment: 4 Education Assessment Education Provided To: Patient Education Topics Provided Wound/Skin Impairment: Handouts: Caring for Your Ulcer Methods: Demonstration, Explain/Verbal Responses: State content correctly Electronic Signature(s) Signed: 08/01/2019 4:30:40 PM By: Rodell Perna Entered By: Rodell Perna on 08/01/2019 13:53:36 [name] ([id number]) -------------------------------------------------------------------------------- Wound Assessment Details Patient Name: [name] Date of Service: 08/01/2019 1:00 PM Medical Record Number: [id number] Patient Account Number: [id number] Date of Birth/Sex: 02-15-1930 (85 y.o. F) Treating RN: Curtis Sites Primary Care Louann Hopson: Aram Beecham Other Clinician: Referring Randi [employer]: Aram Beecham Treating Brenson Hartman/Extender: Linwood Dibbles, HOYT Weeks in Treatment: 4 Wound Status Wound Number: [id number] Primary Skin Tear Etiology: Wound Location: Right Lower Leg - Anterior Wound Open Wounding Event: Trauma Status: Date Acquired: 06/27/2019 Comorbid Cataracts, Glaucoma, Optic Neuritis, Chronic Weeks Of Treatment: 4 History: sinus problems/congestion, Middle ear problems Clustered Wound: No Photos Wound Measurements Length: (cm) 3.2 % Reduction Width: (cm) 3.7 % Reduction Depth: (cm) 0.2 Epithelializ Area: (cm) 9.299 Tunneling: Volume: (cm) 1.86 Undermining in Area:  12.9% in Volume: 65.2% ation: None No : No Wound Description Classification: Partial Thickness Foul Odor A Wound Margin: Flat and Intact Slough/Fibr Exudate Amount: Medium Exudate Type: Sanguinous Exudate Color: red fter Cleansing: No ino Yes Wound Bed Granulation Amount: Medium (34-66%) Exposed Structure Granulation Quality: Red Fascia Exposed: No Necrotic Amount:  Medium (34-66%) Fat Layer (Subcutaneous Tissue) Exposed: Yes Necrotic Quality: Eschar, Adherent Slough Tendon Exposed: No Muscle Exposed: No Joint Exposed: No Bone Exposed: No Electronic Signature(s) [name], [name] ([id number]) Signed: 08/01/2019 4:23:33 PM By: Curtis Sites Entered By: Curtis Sites on 08/01/2019 13:17:10 [name] ([id number]) -------------------------------------------------------------------------------- Vitals Details Patient Name: [name] Date of Service: 08/01/2019 1:00 PM Medical Record Number: [id number] Patient Account Number: [id number] Date of Birth/Sex: 02-11-1930 (85 y.o. F) Treating RN: Rodell Perna Primary Care Jiovany Scheffel: Aram Beecham Other Clinician: Referring Marlo Goodrich: Aram Beecham Treating Emeka Lindner/Extender: Linwood Dibbles, HOYT Weeks in Treatment: 4 Vital Signs Time Taken: 13:05 Temperature (F): 98.1 Height (in): 60 Pulse (bpm): 73 Weight (lbs): 125 Respiratory Rate (breaths/min): 18 Body Mass Index (BMI): 24.4 Blood Pressure (mmHg): 145/36 Reference Range: 80 - 120 mg / dl Electronic Signature(s) Signed: 08/01/2019 3:50:31 PM By: Dayton Martes RCP, RRT, CHT Entered By: Dayton Martes on 08/01/2019 13:08:07

## 2019-08-01 NOTE — Progress Notes (Addendum)
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 offered to attempt to close this. We have been using endoform 1/15; wound actually is not doing as well as last week. She was actually not supposed to come into this clinic again until next week but apparently her attendant noticed some redness increasing pain and she came in early. She reports the same amount of drainage. We have been using endoform. She is approved through puraply however I will only consider starting that next week 1/22; she completed the antibiotics last week. Culture I did was negative. In spite of this there is less erythema and pain complaints in the wound. Puraply #1 applied today 1/29; Puraply #2 today. Wound surface looks a lot  better post debridement of adherent fibrinous material. However undermining from 6-12 is measuring worse 2/5; Puraply #3. Using her elbow brace 2/12 puraply #4 2/19 puraply #5. The 9:00 undermining measured at 0.5 cm. Undermining from 4-11 o'clock. Surface of the wound looks better and the circumference of the wound is smaller however the undermining is not really changed 2/26; still not much improvement. She has undermining from 4-9 o'clock 0.9 cm. Surface of the wound covered and adherent debris. 3/4; still no improvement. Undermining from 4-9 o'clock still around a centimeter. Surface of the wound looks somewhat better. No debridement is required we used endoform after we ended the trial of puraply last week 3/11; really no improvement at all. Still 1 cm undermining from roughly 9-3 o'clock. This is about a centimeter. The base of the wound looks fairly healthy. No debridement. We have been using endoform. I am really out of most usual options here. I could consider either another round of an amniotic advanced treatment product example epifix or perhaps regranex. Understandably the patient is a bit frustrated. We did send her to plastic surgery for a consult. Other than prescribing her a brace to immobilize the elbow they did not think she was a candidate for any further surgery. Notable that the patient is not using the brace today 3/18-Patient returns for attention to the left elbow area which apparently looked red at the home health visit. Patient's elbow looks the same if not better compared with last visit. The area of ulceration remains the same, the base appears healthy. We are continuing to use endoform she has been encouraged to use the brace to keep the elbow straight 3/25; the patient has an appointment at the [facility] wound care center on 4/2. I had actually put her out indefinitely however she seems to want to come back here every week. This week she complains of increased pain and  malodor. Dimensions of the elbow wound are larger. We have been using endoform 4/8; the patient went to [facility] where they apparently gave her meta honey and some border foam with a Tubigrip. She has been using this for a week. She says she was very impressed with them there. They did not offer her any surgical consultation. She seems to be coming back here for a second opinion on this, she does not wish to drive to [facility] every week 2/95; still  using Medihoney foam border and a Tubigrip. Actually do not think she has anything on the arm at all specifically she is not using her brace 5/6; she has been using medihoney without a lot of improvement. Undermining maximum at 9:00 at 0.5 cm may be somewhat better. She is still complaining of discomfort. She uses her elbow brace at night but is not using anything on the arm during the day, she finds it too restrictive 5/13; we switch the patient to endoform AG last week. She is complaining of more pain and worried about some circumferential erythema around the wound. I had planned to consider epifix in this wound however the patient came in with a request to see a plastic surgeon in [place] by the name of Dr. Wyline Mood who cared for a friend of hers. Noteworthy that I have already sent her to one plastic surgeon and she went for another second opinion at [facility] and they apparently did not send her to a plastic surgeon nevertheless the patient is fairly convinced that she might benefit from a skin graft which I am doubtful. She also has rheumatoid arthritis and is on methotrexate. This was originally a surgical wound for a bursectomy a year or 2 ago Readmission: 06/28/19 on evaluation today patient presents today for reevaluation but this is due to a new issue her elbow has completely healed and looks excellent. Her right anterior lower leg has a skin tear which was sustained from her dog who jumped up on her and inadvertently scratch the area causing the skin  tear. This happened yesterday. She is having some discomfort but fortunately nothing too significant which is good news. No fevers, chills, nausea, or vomiting noted at this time. The skin fortunately was knocked one completely off and we are gonna see about we approximate in the skin as best we can in using Steri-Strips to hold this in place obviously if we can get some of this to reattach that would be beneficial CARYLON, TAMBURRO. ([id number]) 07/05/2019 on evaluation today patient actually appears to be doing okay although unfortunately the skin flap that we were attempting to Steri-Stripped down last week did not take. She is developing a lot of fluid underneath the wound area unfortunately which again is not ideal. I think that this necrotic tissue needs to be removed and again it actually just wiped off during the evaluation today as I was attempting to clean the wound there did not appear to be any significant issues underlying which is good although there was some purulent drainage I did want to go ahead and see about obtaining a culture from today in order to ensure that the Z-Pak that I placed her on earlier in the week was appropriate for treating what ever infection may be causing the issue currently. This was a deep wound culture obtained today. 07/12/2019 on evaluation today patient actually appears to be doing quite well with regard to her right lower extremity ulcer all things considering. There is still little bit of hematoma not it that is noted in the central portion of the wound along with some necrotic tissue but she is still having quite a bit of discomfort. For that reason I did not perform sharp debridement today although this wound does need some debridement of one type or another. I think we may attempt Iodoflex to see if this can be of benefit. Fortunately there is no signs of active infection at this point. 07/18/19 upon evaluation today patient appears to be doing  better with  regard to her ulcer on her right lower extremity. She's been tolerating the dressing changes without complication. The good news is she seems to be making excellent progress. Overall I'm pleased with there being no signs of infection. With that being said she does tell me that she's been having some discomfort with the wrap she's unsure of exactly what about the rafters causing her trouble. 07/25/2019 on evaluation today patient appears to be doing a little better in regard to her lower extremity ulcer. Unfortunately the alginate seems to be getting really stuck to the wound bed and surrounding periwound. Fortunately there is no signs of active infection at this time. There does not appear to be any evidence of infection currently. 08/01/2019 on evaluation today patient actually appears to be doing much better with regard to her right lower extremity ulcer. She has been tolerating the dressing changes without complication. Fortunately her wound does not show any signs of infection and seems to be making good progress. No fevers, chills, nausea, vomiting, or diarrhea. Patient History Information obtained from Patient. Family History Heart Disease - Siblings,Father, No family history of Cancer, Diabetes, Hereditary Spherocytosis, Hypertension, Kidney Disease, Lung Disease, Seizures, Stroke, Thyroid Problems, Tuberculosis. Social History Never smoker, Alcohol Use - Daily, Drug Use - No History, Caffeine Use - Never. Medical History Eyes Patient has history of Cataracts, Glaucoma, Optic Neuritis Ear/Nose/Mouth/Throat Patient has history of Chronic sinus problems/congestion, Middle ear problems Medical And Surgical History Notes Constitutional Symptoms (General Health) Back pain Ear/Nose/Mouth/Throat bilateral hearing aides Review of Systems (ROS) Constitutional Symptoms (General Health) Denies complaints or symptoms of Fatigue, Fever, Chills, Marked Weight Change. Respiratory Denies  complaints or symptoms of Chronic or frequent coughs, Shortness of Breath. Cardiovascular Denies complaints or symptoms of Chest pain, LE edema. Psychiatric XAVIERA, FLATEN ([id number]) Denies complaints or symptoms of Anxiety, Claustrophobia. Objective Constitutional Well-nourished and well-hydrated in no acute distress. Vitals Time Taken: 1:05 PM, Height: 60 in, Weight: 125 lbs, BMI: 24.4, Temperature: 98.1 F, Pulse: 73 bpm, Respiratory Rate: 18 breaths/min, Blood Pressure: 145/36 mmHg. Respiratory normal breathing without difficulty. Psychiatric this patient is able to make decisions and demonstrates good insight into disease process. Alert and Oriented x 3. pleasant and cooperative. General Notes: Upon inspection today patient's wound bed actually showed signs of fairly good granulation underneath some slough which was buildup on the surface of the wound. Subsequently this did require sharp debridement to clear away the necrotic tissue and post debridement wound bed appears to be doing better although not completely clean it is much improved compared to where it was previous. Integumentary (Hair, Skin) Wound #11 status is Open. Original cause of wound was Trauma. The wound is located on the Right,Anterior Lower Leg. The wound measures 3.2cm length x 3.7cm width x 0.2cm depth; 9.299cm^2 area and 1.86cm^3 volume. There is Fat Layer (Subcutaneous Tissue) Exposed exposed. There is no tunneling or undermining noted. There is a medium amount of sanguinous drainage noted. The wound margin is flat and intact. There is medium (34-66%) red granulation within the wound bed. There is a medium (34-66%) amount of necrotic tissue within the wound bed including Eschar and Adherent Slough. Assessment Active Problems ICD-10 Unspecified open wound, right lower leg, initial encounter Non-pressure chronic ulcer of other part of right lower leg with fat layer exposed Struck by dog, initial  encounter Procedures JENNIFFER, VESSELS ([id number]) Wound #11 Pre-procedure diagnosis of Wound #11 is a Skin Tear located on the Right,Anterior Lower Leg . There was  a Excisional Skin/Subcutaneous Tissue Debridement with a total area of 11.84 sq cm performed by STONE III, HOYT E., PA-C. With the following instrument(s): Curette to remove Viable and Non-Viable tissue/material. Material removed includes Subcutaneous Tissue and Slough and. A time out was conducted at 13:50, prior to the start of the procedure. A Minimum amount of bleeding was controlled with N/A. The procedure was tolerated well. Post Debridement Measurements: 3.2cm length x 3.7cm width x 0.2cm depth; 1.86cm^3 volume. Character of Wound/Ulcer Post Debridement is stable. Post procedure Diagnosis Wound #11: Same as Pre-Procedure Plan Wound Cleansing: Wound #11 Right,Anterior Lower Leg: Clean wound with Normal Saline. May shower with protection. - Please do not get your wrap wet Skin Barriers/Peri-Wound Care: Wound #11 Right,Anterior Lower Leg: Barrier cream Primary Wound Dressing: Wound #11 Right,Anterior Lower Leg: Santyl Ointment Secondary Dressing: Wound #11 Right,Anterior Lower Leg: ABD pad Saline moistened gauze Other - stretch net on top Dressing Change Frequency: Wound #11 Right,Anterior Lower Leg: Change dressing every day. Follow-up Appointments: Wound #11 Right,Anterior Lower Leg: Return Appointment in 1 week. Edema Control: Wound #11 Right,Anterior Lower Leg: Other: - tubi grip F over stretch net 1. I am in a suggest that we continue with the Santyl for the time being I do believe this has been helpful for her and hopefully will continue to be such. 2. I am going to suggest as well that we go ahead and continue with the Tubigrip I do feel like this is keeping her swelling under okay control not as good as a wrap but she is able to at least tolerate this. 3. We will add barrier cream around the edges of  the wound to prevent further breakdown of the wound region due to the amount of moisture that seems to be aggravating the skin surrounding. 4. I still think elevate your legs as much as possible also be beneficial at this point. We will see patient back for reevaluation in 1 week here in the clinic. If anything worsens or changes patient will contact our office for additional recommendations. SAMARRA, RIDGELY ([id number]) Electronic Signature(s) Signed: 08/01/2019 2:11:54 PM By: Lenda Kelp PA-C Entered By: Lenda Kelp on 08/01/2019 14:11:54 Natasha Basset ([id number]) -------------------------------------------------------------------------------- ROS/PFSH Details Patient Name: Natasha Basset Date of Service: 08/01/2019 1:00 PM Medical Record Number: [id number] Patient Account Number: [id number] Date of Birth/Sex: [birth year] (85 y.o. F) Treating RN: Rodell Perna Primary Care Provider: Aram Beecham Other Clinician: Referring Provider: Aram Beecham Treating Provider/Extender: Linwood Dibbles, HOYT Weeks in Treatment: 4 Information Obtained From Patient Constitutional Symptoms (General Health) Complaints and Symptoms: Negative for: Fatigue; Fever; Chills; Marked Weight Change Medical History: Past Medical History Notes: Back pain Respiratory Complaints and Symptoms: Negative for: Chronic or frequent coughs; Shortness of Breath Cardiovascular Complaints and Symptoms: Negative for: Chest pain; LE edema Psychiatric Complaints and Symptoms: Negative for: Anxiety; Claustrophobia Eyes Medical History: Positive for: Cataracts; Glaucoma; Optic Neuritis Ear/Nose/Mouth/Throat Medical History: Positive for: Chronic sinus problems/congestion; Middle ear problems Past Medical History Notes: bilateral hearing aides HBO Extended History Items Ear/Nose/Mouth/Throat: Eyes: Eyes: Ear/Nose/Mouth/Throat: Chronic sinus Cataracts Glaucoma Middle ear  problems problems/congestion Immunizations Pneumococcal Vaccine: Received Pneumococcal Vaccination: No Implantable Devices SAQUOIA, SIANEZ ([id number]) Yes Family and Social History Cancer: No; Diabetes: No; Heart Disease: Yes - Siblings,Father; Hereditary Spherocytosis: No; Hypertension: No; Kidney Disease: No; Lung Disease: No; Seizures: No; Stroke: No; Thyroid Problems: No; Tuberculosis: No; Never smoker; Alcohol Use: Daily; Drug Use: No History; Caffeine Use: Never; Financial Concerns: No; Food, Clothing or Shelter  Needs: No; Support System Lacking: No; Transportation Concerns: No Physician Affirmation I have reviewed and agree with the above information. Electronic Signature(s) Signed: 08/01/2019 4:30:40 PM By: Rodell Perna Signed: 08/01/2019 5:39:21 PM By: Lenda Kelp PA-C Entered By: Lenda Kelp on 08/01/2019 14:10:55 Natasha Basset ([id number]) -------------------------------------------------------------------------------- SuperBill Details Patient Name: Natasha Basset Date of Service: 08/01/2019 Medical Record Number: [id number] Patient Account Number: [id number] Date of Birth/Sex: 1930-03-30 (85 y.o. F) Treating RN: Rodell Perna Primary Care Provider: Aram Beecham Other Clinician: Referring Provider: Aram Beecham Treating Provider/Extender: Linwood Dibbles, HOYT Weeks in Treatment: 4 Diagnosis Coding ICD-10 Codes Code Description S81.801A Unspecified open wound, right lower leg, initial encounter L97.812 Non-pressure chronic ulcer of other part of right lower leg with fat layer exposed W54.1XXA Struck by dog, initial encounter Facility Procedures CPT4 Code Description: [id number] - DEB SUBQ TISSUE 20 SQ CM/< ICD-10 Diagnosis Description L97.812 Non-pressure chronic ulcer of other part of right lower leg wit Modifier: h fat layer expo Quantity: 1 sed Physician Procedures CPT4 Code Description: 7846962 11042 - WC PHYS SUBQ TISS 20 SQ CM ICD-10 Diagnosis  Description L97.812 Non-pressure chronic ulcer of other part of right lower leg wit Modifier: h fat layer expo Quantity: 1 sed Electronic Signature(s) Signed: 08/01/2019 2:12:18 PM By: Lenda Kelp PA-C Entered By: Lenda Kelp on 08/01/2019 14:12:18

## 2019-08-08 ENCOUNTER — Encounter: Payer: Medicare Other | Admitting: Physician Assistant

## 2019-08-08 ENCOUNTER — Other Ambulatory Visit: Payer: Self-pay

## 2019-08-08 DIAGNOSIS — S81811A Laceration without foreign body, right lower leg, initial encounter: Secondary | ICD-10-CM | POA: Diagnosis not present

## 2019-08-08 NOTE — Progress Notes (Addendum)
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 adhered. She is wearing the brace that was given or prescribed to her by plastics. She did not have a procedure offered to attempt to close this. We have been using endoform 1/15; wound actually is not doing as well as last week. She was actually not supposed to come into this clinic again until next week but apparently her attendant noticed some redness increasing pain and she came in early. She reports the same amount of drainage. We have  been using endoform. She is approved through puraply however I will only consider starting that next week 1/22; she completed the antibiotics last week. Culture I did was negative. In spite of this there is less erythema and pain complaints in the wound. Puraply #1 applied today 1/29; Puraply #2 today. Wound surface looks a lot better post debridement of adherent fibrinous material. However undermining from 6-12 is measuring worse 2/5; Puraply #3. Using her elbow brace 2/12 puraply #4 2/19 puraply #5. The 9:00 undermining measured at 0.5 cm. Undermining from 4-11 o'clock. Surface of the wound looks better and the circumference of the wound is smaller however the undermining is not really changed 2/26; still not much improvement. She has undermining from 4-9 o'clock 0.9 cm. Surface of the wound covered and adherent debris. 3/4; still no improvement. Undermining from 4-9 o'clock still around a centimeter. Surface of the wound looks somewhat better. No debridement is required we used endoform after we ended the trial of puraply last week 3/11; really no improvement at all. Still 1 cm undermining from roughly 9-3 o'clock. This is about a centimeter. The base of the wound looks fairly healthy. No debridement. We have been using endoform. I am really out of most usual options here. I could consider either another round of an amniotic advanced treatment product example epifix or perhaps regranex. Understandably the patient is a bit frustrated. We did send her to plastic surgery for a consult. Other than prescribing her a brace to immobilize the elbow they did not think she was a candidate for any further surgery. Notable that the patient is not using the brace today 3/18-Patient returns for attention to the left elbow area which apparently looked red at the home health visit. Patient's elbow looks the same if not better compared with last visit. The area of ulceration remains the same, the base appears  healthy. We are continuing to use endoform she has been encouraged to use the brace to keep the elbow straight 3/25; the [facility]. I had actually put her out indefinitely however she seems to want to come back here every week. This week she complains of increased pain and malodor. Dimensions of the elbow wound are larger. We have been using endoform 4/8; [facility]  meta honey and some border foam with a Tubigrip. She has been using this for a week. She says she was very impressed with them there. They did not offer her any surgical consultation. She seems to be coming back here for a second opinion on this, she does not wish to drive to [facility] every week 8/85; still using Medihoney foam border and a Tubigrip. Actually do not think she has anything on the arm at all specifically she is not using her brace 5/6; she has been using medihoney without a lot of improvement. Undermining maximum at 9:00 at 0.5 cm may be somewhat better. She is still complaining of discomfort. She uses her elbow brace at night but is not using anything on the arm during the day, she finds it too restrictive 5/13; we switch the patient to endoform AG last week. She is complaining of more pain and worried about some circumferential erythema around the wound. I had planned to consider epifix in this wound however the patient came in with a request to see a plastic surgeon in [place] by the name of Dr. Wyline Mood who cared for a friend of hers. Noteworthy that I have already sent her to one plastic surgeon and she went for another second opinion at [facility] and they apparently did not send her to a plastic surgeon nevertheless the patient is fairly convinced that she might benefit from a skin graft which I am doubtful. She also has rheumatoid arthritis and is on methotrexate. This was originally a surgical wound for a bursectomy  a year or 2 ago Readmission: 06/28/19 on evaluation today patient presents today for reevaluation but this is due to a new issue her elbow has completely healed and looks excellent. Her right anterior lower leg has a skin tear which was sustained from her dog who jumped up on her and inadvertently scratch the area causing the skin tear. This happened yesterday. She is having some discomfort but fortunately nothing too significant which is good news. No fevers, chills, nausea, or vomiting noted at this time. The skin fortunately was knocked one completely off and we are gonna see about we approximate in the skin as best we can in using Steri-Strips to hold this in place obviously if we can get some of this to reattach that would be beneficial [name], [name]. ([id number]) 07/05/2019 on evaluation today patient actually appears to be doing okay although unfortunately the skin flap that we were attempting to Steri-Stripped down last week did not take. She is developing a lot of fluid underneath the wound area unfortunately which again is not ideal. I think that this necrotic tissue needs to be removed and again it actually just wiped off during the evaluation today as I was attempting to clean the wound there did not appear to be any significant issues underlying which is good although there was some purulent drainage I did want to go ahead and see about obtaining a culture from today in order to ensure that the Z-Pak that I placed her on earlier in the week was appropriate for treating what ever infection may be causing the issue currently. This was a deep wound culture obtained today. 07/12/2019 on evaluation today patient actually appears to be doing quite well with regard to her right lower extremity ulcer all things considering. There is still little bit of hematoma not it that is noted in the central portion of the wound along with some necrotic tissue but she is still having quite a  bit of  discomfort. For that reason I did not perform sharp debridement today although this wound does need some debridement of one type or another. I think we may attempt Iodoflex to see if this can be of benefit. Fortunately there is no signs of active infection at this point. 07/18/19 upon evaluation today patient appears to be doing better with regard to her ulcer on her right lower extremity. She's been tolerating the dressing changes without complication. The good news is she seems to be making excellent progress. Overall I'm pleased with there being no signs of infection. With that being said she does tell me that she's been having some discomfort with the wrap she's unsure of exactly what about the rafters causing her trouble. 07/25/2019 on evaluation today patient appears to be doing a little better in regard to her lower extremity ulcer. Unfortunately the alginate seems to be getting really stuck to the wound bed and surrounding periwound. Fortunately there is no signs of active infection at this time. There does not appear to be any evidence of infection currently. 08/01/2019 on evaluation today patient actually appears to be doing much better with regard to her right lower extremity ulcer. She has been tolerating the dressing changes without complication. Fortunately her wound does not show any signs of infection and seems to be making good progress. No fevers, chills, nausea, vomiting, or diarrhea. 08/08/2019 on evaluation today patient actually appears to be doing better with regard to her leg ulcer. She has been tolerating the dressing changes without complication. With that being said I think we may want to switch the dressing up today just based on the appearance of the wound bed in general. Fortunately there is no evidence of infection currently. Her pain seems to be much better. Her son is present with her today. Patient History Information obtained from Patient. Family History Heart  Disease - Siblings,Father, No family history of Cancer, Diabetes, Hereditary Spherocytosis, Hypertension, Kidney Disease, Lung Disease, Seizures, Stroke, Thyroid Problems, Tuberculosis. Social History Never smoker, Alcohol Use - Daily, Drug Use - No History, Caffeine Use - Never. Medical History Eyes Patient has history of Cataracts, Glaucoma, Optic Neuritis Ear/Nose/Mouth/Throat Patient has history of Chronic sinus problems/congestion, Middle ear problems Medical And Surgical History Notes Constitutional Symptoms (General Health) Back pain Ear/Nose/Mouth/Throat bilateral hearing aides Review of Systems (ROS) Constitutional Symptoms (Blessing) Denies complaints or symptoms of Fatigue, Fever, Chills, Marked Weight Change. [name], [name] ([id number]) Respiratory Denies complaints or symptoms of Chronic or frequent coughs, Shortness of Breath. Cardiovascular Denies complaints or symptoms of Chest pain, LE edema. Psychiatric Denies complaints or symptoms of Anxiety, Claustrophobia. Objective Constitutional Well-nourished and well-hydrated in no acute distress. Vitals Time Taken: 10:40 AM, Height: 60 in, Weight: 125 lbs, BMI: 24.4, Temperature: 98.3 F, Pulse: 67 bpm, Respiratory Rate: 18 breaths/min, Blood Pressure: 146/57 mmHg. Respiratory normal breathing without difficulty. Psychiatric this patient is able to make decisions and demonstrates good insight into disease process. Alert and Oriented x 3. pleasant and cooperative. General Notes: Patient's wound bed currently showed signs of good granulation there was some slough noted and did require sharp debridement at this point. Post debridement the wound bed appears to be doing much better which is excellent news. Overall very pleased with how things seem to be progressing. Integumentary (Hair, Skin) Wound #11 status is Open. Original cause of wound was Trauma. The wound is located on the Right,Anterior Lower Leg.  The wound measures 3.5cm length x 4.5cm width x 0.2cm depth; 12.37cm^2  area and 2.474cm^3 volume. There is Fat Layer (Subcutaneous Tissue) Exposed exposed. There is no tunneling or undermining noted. There is a medium amount of sanguinous drainage noted. The wound margin is flat and intact. There is medium (34-66%) red granulation within the wound bed. There is a medium (34-66%) amount of necrotic tissue within the wound bed including Eschar and Adherent Slough. Assessment Active Problems ICD-10 Unspecified open wound, right lower leg, initial encounter Non-pressure chronic ulcer of other part of right lower leg with fat layer exposed Struck by dog, initial encounter [name], [name] ([id number]) Procedures Wound #11 Pre-procedure diagnosis of Wound #11 is a Skin Tear located on the Right,Anterior Lower Leg . There was a Excisional Skin/Subcutaneous Tissue Debridement with a total area of 15.75 sq cm performed by STONE III, HOYT E., PA-C. With the following instrument(s): Curette to remove Viable and Non-Viable tissue/material. Material removed includes Subcutaneous Tissue and Slough and after achieving pain control using Lidocaine. A time out was conducted at 11:14, prior to the start of the procedure. A Minimum amount of bleeding was controlled with Pressure. The procedure was tolerated well. Post Debridement Measurements: 3.5cm length x 4.5cm width x 0.2cm depth; 2.474cm^3 volume. Character of Wound/Ulcer Post Debridement is stable. Post procedure Diagnosis Wound #11: Same as Pre-Procedure Plan Wound Cleansing: Wound #11 Right,Anterior Lower Leg: Clean wound with Normal Saline. May shower with protection. - Please do not get your wrap wet Skin Barriers/Peri-Wound Care: Wound #11 Right,Anterior Lower Leg: Barrier cream - peri-wound Primary Wound Dressing: Wound #11 Right,Anterior Lower Leg: Hydrafera Blue Ready Transfer Secondary Dressing: Wound #11 Right,Anterior Lower Leg: ABD  pad Conform/Kerlix Dressing Change Frequency: Wound #11 Right,Anterior Lower Leg: Change dressing every other day. Follow-up Appointments: Wound #11 Right,Anterior Lower Leg: Return Appointment in 1 week. Edema Control: Wound #11 Right,Anterior Lower Leg: Other: - tubi grip F 1. I would recommend that we switch the dressing to [facility], The dressing I think this is much more appropriate with the appearance of the wound at this point and will hopefully help this to heal more effectively and quickly. She is in agreement with this plan. We will subsequently see where things stand at follow-up in 1 week's time. 2. I still think that she needs to have some compression and if nothing else is tolerated I definitely think the Tubigrip should be continued. We will just secure the dressing with Kerlix and then subsequently apply the Tubigrip over top. We will still use the barrier cream as well. We will see patient back for reevaluation in 1 week here in the clinic. If anything worsens or changes patient will contact our office for additional recommendations. [name], [name] ([id number]) Electronic Signature(s) Signed: 08/09/2019 8:16:46 AM By: Lenda Kelp PA-C Entered By: Lenda Kelp on 08/09/2019 08:16:46 [name] ([id number]) -------------------------------------------------------------------------------- ROS/PFSH Details Patient Name: [name] Date of Service: 08/08/2019 10:30 AM Medical Record Number: [id number] Patient Account Number: [id number] Date of Birth/Sex: 1930/09/05 (85 y.o. F) Treating RN: Rodell Perna Primary Care Provider: Aram Beecham Other Clinician: Referring Provider: Aram Beecham Treating Provider/Extender: Linwood Dibbles, HOYT Weeks in Treatment: 5 Information Obtained From Patient Constitutional Symptoms (General Health) Complaints and Symptoms: Negative for: Fatigue; Fever; Chills; Marked Weight Change Medical History: Past Medical History  Notes: Back pain Respiratory Complaints and Symptoms: Negative for: Chronic or frequent coughs; Shortness of Breath Cardiovascular Complaints and Symptoms: Negative for: Chest pain; LE edema Psychiatric Complaints and Symptoms: Negative for: Anxiety; Claustrophobia Eyes Medical History: Positive for: Cataracts; Glaucoma;  Optic Neuritis Ear/Nose/Mouth/Throat Medical History: Positive for: Chronic sinus problems/congestion; Middle ear problems Past Medical History Notes: bilateral hearing aides HBO Extended History Items Ear/Nose/Mouth/Throat: Eyes: Eyes: Ear/Nose/Mouth/Throat: Chronic sinus Cataracts Glaucoma Middle ear problems problems/congestion Immunizations Pneumococcal Vaccine: Received Pneumococcal Vaccination: No Implantable Devices [name], [name] ([id number]) Yes Family and Social History Cancer: No; Diabetes: No; Heart Disease: Yes - Siblings,Father; Hereditary Spherocytosis: No; Hypertension: No; Kidney Disease: No; Lung Disease: No; Seizures: No; Stroke: No; Thyroid Problems: No; Tuberculosis: No; Never smoker; Alcohol Use: Daily; Drug Use: No History; Caffeine Use: Never; Financial Concerns: No; Food, Clothing or Shelter Needs: No; Support System Lacking: No; Transportation Concerns: No Physician Affirmation I have reviewed and agree with the above information. Electronic Signature(s) Signed: 08/09/2019 9:37:06 AM By: Rodell Perna Signed: 08/11/2019 6:59:38 PM By: Lenda Kelp PA-C Entered By: Lenda Kelp on 08/09/2019 08:15:45 [name] ([id number]) -------------------------------------------------------------------------------- SuperBill Details Patient Name: [name] Date of Service: 08/08/2019 Medical Record Number: [id number] Patient Account Number: [id number] Date of Birth/Sex: [birth year] (85 y.o. F) Treating RN: Rodell Perna Primary Care Provider: Aram Beecham Other Clinician: Referring Provider: Aram Beecham Treating  Provider/Extender: Linwood Dibbles, HOYT Weeks in Treatment: 5 Diagnosis Coding ICD-10 Codes Code Description S81.801A Unspecified open wound, right lower leg, initial encounter L97.812 Non-pressure chronic ulcer of other part of right lower leg with fat layer exposed W54.1XXA Struck by dog, initial encounter Facility Procedures CPT4 Code Description: [id number] [phone] - DEB SUBQ TISSUE 20 SQ CM/< ICD-10 Diagnosis Description L97.812 Non-pressure chronic ulcer of other part of right lower leg wit Modifier: h fat layer expo Quantity: 1 sed Physician Procedures CPT4 Code Description: [id number] - WC PHYS SUBQ TISS 20 SQ CM ICD-10 Diagnosis Description L97.812 Non-pressure chronic ulcer of other part of right lower leg wit Modifier: h fat layer expo Quantity: 1 sed Electronic Signature(s) Signed: 08/11/2019 6:59:38 PM By: Lenda Kelp PA-C Entered By: Lenda Kelp on 08/08/2019 22:28:35

## 2019-08-09 NOTE — Progress Notes (Signed)
Natasha, Barnett ([id number]) Visit Report for 08/08/2019 Arrival Information Details Patient Name: [name], [name]. Date of Service: 08/08/2019 10:30 AM Medical Record Number: [id number] Patient Account Number: [id number] Date of Birth/Sex: January 29, 1930 (85 y.o. F) Treating RN: Harold Barban Primary Care Shervon Kerwin: Fulton Reek Other Clinician: Referring Jaydian Santana: Fulton Reek Treating Marcas Bowsher/Extender: Melburn Hake, HOYT Weeks in Treatment: 5 Visit Information History Since Last Visit Added or deleted any medications: No Patient Arrived: Walker Any new allergies or adverse reactions: No Arrival Time: 10:39 Had a fall or experienced change in No Accompanied By: son activities of daily living that may affect Transfer Assistance: None risk of falls: Patient Identification Verified: Yes Signs or symptoms of abuse/neglect since last visito No Secondary Verification Process Completed: Yes Hospitalized since last visit: No Has Dressing in Place as Prescribed: Yes Pain Present Now: No Electronic Signature(s) Signed: 08/08/2019 4:43:55 PM By: Harold Barban Entered By: Harold Barban on 08/08/2019 10:40:38 [name] ([id number]) -------------------------------------------------------------------------------- Encounter Discharge Information Details Patient Name: [name] Date of Service: 08/08/2019 10:30 AM Medical Record Number: [id number] Patient Account Number: [id number] Date of Birth/Sex: May 02, 1930 (85 y.o. F) Treating RN: Army Melia Primary Care Tahja Liao: Fulton Reek Other Clinician: Referring Ireland Chagnon: Fulton Reek Treating Lailanie Hasley/Extender: Melburn Hake, HOYT Weeks in Treatment: 5 Encounter Discharge Information Items Post Procedure Vitals Discharge Condition: Stable Temperature (F): 98.3 Ambulatory Status: Walker Pulse (bpm): 67 Discharge Destination: Home Respiratory Rate (breaths/min): 18 Transportation: Private Auto Blood Pressure (mmHg):  146/57 Accompanied By: son Schedule Follow-up Appointment: Yes Clinical Summary of Care: Electronic Signature(s) Signed: 08/08/2019 11:49:33 AM By: Army Melia Entered By: Army Melia on 08/08/2019 11:28:56 [name] ([id number]) -------------------------------------------------------------------------------- Lower Extremity Assessment Details Patient Name: [name] Date of Service: 08/08/2019 10:30 AM Medical Record Number: [id number] Patient Account Number: [id number] Date of Birth/Sex: [birth year] (85 y.o. F) Treating RN: Harold Barban Primary Care Markale Birdsell: Fulton Reek Other Clinician: Referring Shiv Shuey: Fulton Reek Treating Marge Vandermeulen/Extender: Melburn Hake, HOYT Weeks in Treatment: 5 Edema Assessment Assessed: [Left: No] [Right: No] [Left: Edema] [Right: :] Calf Left: Right: Point of Measurement: 30 cm From Medial Instep cm 34 cm Ankle Left: Right: Point of Measurement: 10 cm From Medial Instep cm 23 cm Vascular Assessment Pulses: Dorsalis Pedis Palpable: [Right:Yes] Posterior Tibial Palpable: [Right:Yes] Electronic Signature(s) Signed: 08/08/2019 4:43:55 PM By: Harold Barban Entered By: Harold Barban on 08/08/2019 10:50:54 [name] ([id number]) -------------------------------------------------------------------------------- Multi Wound Chart Details Patient Name: [name] Date of Service: 08/08/2019 10:30 AM Medical Record Number: [id number] Patient Account Number: [id number] Date of Birth/Sex: 1930-10-27 (85 y.o. F) Treating RN: Army Melia Primary Care Itzia Cunliffe: Fulton Reek Other Clinician: Referring Brittnie Lewey: Fulton Reek Treating Saher Davee/Extender: Melburn Hake, HOYT Weeks in Treatment: 5 Vital Signs Height(in): 60 Pulse(bpm): 27 Weight(lbs): 125 Blood Pressure(mmHg): 146/57 Body Mass Index(BMI): 24 Temperature(F): 98.3 Respiratory Rate 18 (breaths/min): Photos: [N/A:N/A] Wound Location: Right Lower Leg -  Anterior N/A N/A Wounding Event: Trauma N/A N/A Primary Etiology: Skin Tear N/A N/A Comorbid History: Cataracts, Glaucoma, Optic N/A N/A Neuritis, Chronic sinus problems/congestion, Middle ear problems Date Acquired: 06/27/2019 N/A N/A Weeks of Treatment: 5 N/A N/A Wound Status: Open N/A N/A Measurements L x W x D 3.5x4.5x0.2 N/A N/A (cm) Area (cm) : 12.37 N/A N/A Volume (cm) : 2.474 N/A N/A % Reduction in Area: -15.80% N/A N/A % Reduction in Volume: 53.70% N/A N/A Classification: Partial Thickness N/A N/A Exudate Amount: Medium N/A N/A Exudate Type: Sanguinous N/A N/A Exudate Color: red N/A N/A Wound Margin: Flat and Intact N/A  N/A Granulation Amount: Medium (34-66%) N/A N/A Granulation Quality: Red N/A N/A Necrotic Amount: Medium (34-66%) N/A N/A Necrotic Tissue: Eschar, Adherent Slough N/A N/A Exposed Structures: Fat Layer (Subcutaneous N/A N/A Tissue) Exposed: Yes Fascia: No Tendon: No APPHIA, CROPLEY ([id number]) Muscle: No Joint: No Bone: No Epithelialization: None N/A N/A Treatment Notes Electronic Signature(s) Signed: 08/08/2019 11:49:33 AM By: Rodell Perna Entered By: Rodell Perna on 08/08/2019 11:09:18 [name] ([id number]) -------------------------------------------------------------------------------- Multi-Disciplinary Care Plan Details Patient Name: [name] Date of Service: 08/08/2019 10:30 AM Medical Record Number: [id number] Patient Account Number: [id number] Date of Birth/Sex: September 30, 1930 (85 y.o. F) Treating RN: Rodell Perna Primary Care Shaunda Tipping: Aram Beecham Other Clinician: Referring Wynee Matarazzo: Aram Beecham Treating Jessicah Croll/Extender: Linwood Dibbles, HOYT Weeks in Treatment: 5 Active Inactive Abuse / Safety / Falls / Self Care Management Nursing Diagnoses: Potential for falls Goals: Patient will remain injury free related to falls Date Initiated: 06/28/2019 Target Resolution Date: 09/21/2019 Goal Status:  Active Interventions: Assess fall risk on admission and as needed Notes: Wound/Skin Impairment Nursing Diagnoses: Impaired tissue integrity Goals: Ulcer/skin breakdown will heal within 14 weeks Date Initiated: 06/28/2019 Target Resolution Date: 09/21/2019 Goal Status: Active Interventions: Assess patient/caregiver ability to obtain necessary supplies Assess patient/caregiver ability to perform ulcer/skin care regimen upon admission and as needed Assess ulceration(s) every visit Notes: Electronic Signature(s) Signed: 08/08/2019 11:49:33 AM By: Rodell Perna Entered By: Rodell Perna on 08/08/2019 11:08:56 [name] ([id number]) -------------------------------------------------------------------------------- Pain Assessment Details Patient Name: [name] Date of Service: 08/08/2019 10:30 AM Medical Record Number: [id number] Patient Account Number: [id number] Date of Birth/Sex: [birth year] (85 y.o. F) Treating RN: Arnette Norris Primary Care Beyonka Pitney: Aram Beecham Other Clinician: Referring Tonye Tancredi: Aram Beecham Treating Corey Caulfield/Extender: Linwood Dibbles, HOYT Weeks in Treatment: 5 Active Problems Location of Pain Severity and Description of Pain Patient Has Paino No Site Locations Pain Management and Medication Current Pain Management: Electronic Signature(s) Signed: 08/08/2019 4:43:55 PM By: Arnette Norris Entered By: Arnette Norris on 08/08/2019 10:41:11 [name] ([id number]) -------------------------------------------------------------------------------- Patient/Caregiver Education Details Patient Name: [name] Date of Service: 08/08/2019 10:30 AM Medical Record Number: [id number] Patient Account Number: [id number] Date of Birth/Gender: 1930/06/11 (85 y.o. F) Treating RN: Rodell Perna Primary Care Physician: Aram Beecham Other Clinician: Referring Physician: Aram Beecham Treating Physician/Extender: Skeet Simmer in Treatment:  5 Education Assessment Education Provided To: Patient Education Topics Provided Wound/Skin Impairment: Handouts: Caring for Your Ulcer Methods: Demonstration, Explain/Verbal Responses: State content correctly Electronic Signature(s) Signed: 08/08/2019 11:49:33 AM By: Rodell Perna Entered By: Rodell Perna on 08/08/2019 11:17:58 [name] ([id number]) -------------------------------------------------------------------------------- Wound Assessment Details Patient Name: [name] Date of Service: 08/08/2019 10:30 AM Medical Record Number: [id number] Patient Account Number: [id number] Date of Birth/Sex: 1930/08/23 (85 y.o. F) Treating RN: Arnette Norris Primary Care Stepen Prins: Aram Beecham Other Clinician: Referring Royale Lennartz: Aram Beecham Treating Sherrilyn Nairn/Extender: Linwood Dibbles, HOYT Weeks in Treatment: 5 Wound Status Wound Number: 11 Primary Skin Tear Etiology: Wound Location: Right Lower Leg - Anterior Wound Open Wounding Event: Trauma Status: Date Acquired: 06/27/2019 Comorbid Cataracts, Glaucoma, Optic Neuritis, Chronic Weeks Of Treatment: 5 History: sinus problems/congestion, Middle ear problems Clustered Wound: No Photos Wound Measurements Length: (cm) 3.5 Width: (cm) 4.5 Depth: (cm) 0.2 Area: (cm) 12.37 Volume: (cm) 2.474 % Reduction in Area: -15.8% % Reduction in Volume: 53.7% Epithelialization: None Tunneling: No Undermining: No Wound Description Classification: Partial Thickness Foul Odor Wound Margin: Flat and Intact Slough/Fib Exudate Amount: Medium Exudate Type: Sanguinous Exudate Color: red After Cleansing: No rino  Yes Wound Bed Granulation Amount: Medium (34-66%) Exposed Structure Granulation Quality: Red Fascia Exposed: No Necrotic Amount: Medium (34-66%) Fat Layer (Subcutaneous Tissue) Exposed: Yes Necrotic Quality: Eschar, Adherent Slough Tendon Exposed: No Muscle Exposed: No Joint Exposed: No Bone Exposed: No Treatment  Notes Natasha BassetHENRY, Keileigh V. ([id number]) Wound #11 (Right, Anterior Lower Leg) Notes Hblue, zinc barrier,, ABD, , tubi grip F Electronic Signature(s) Signed: 08/08/2019 11:49:33 AM By: Rodell PernaScott, Dajea Signed: 08/08/2019 4:43:55 PM By: Arnette NorrisBiell, Kristina Entered By: Rodell PernaScott, Dajea on 08/08/2019 11:13:37 Natasha BassetHENRY, Taaliyah V. ([id number]) -------------------------------------------------------------------------------- Vitals Details Patient Name: Natasha BassetHENRY, Sameerah V. Date of Service: 08/08/2019 10:30 AM Medical Record Number: [id number] Patient Account Number: [id number] Date of Birth/Sex: 01/23/1930 (85 y.o. F) Treating RN: Arnette NorrisBiell, Kristina Primary Care Langston Summerfield: Aram BeechamSparks, Jeffrey Other Clinician: Referring Ayoub Arey: Aram BeechamSparks, Jeffrey Treating Connor Foxworthy/Extender: Linwood DibblesSTONE III, HOYT Weeks in Treatment: 5 Vital Signs Time Taken: 10:40 Temperature (F): 98.3 Height (in): 60 Pulse (bpm): 67 Weight (lbs): 125 Respiratory Rate (breaths/min): 18 Body Mass Index (BMI): 24.4 Blood Pressure (mmHg): 146/57 Reference Range: 80 - 120 mg / dl Electronic Signature(s) Signed: 08/08/2019 4:43:55 PM By: Arnette NorrisBiell, Kristina Entered By: Arnette NorrisBiell, Kristina on 08/08/2019 10:41:31

## 2019-08-13 ENCOUNTER — Other Ambulatory Visit: Payer: Self-pay

## 2019-08-13 ENCOUNTER — Ambulatory Visit: Payer: Medicare Other | Attending: Neurology

## 2019-08-13 DIAGNOSIS — G8929 Other chronic pain: Secondary | ICD-10-CM | POA: Diagnosis present

## 2019-08-13 DIAGNOSIS — R262 Difficulty in walking, not elsewhere classified: Secondary | ICD-10-CM | POA: Insufficient documentation

## 2019-08-13 DIAGNOSIS — Z9181 History of falling: Secondary | ICD-10-CM | POA: Diagnosis present

## 2019-08-13 DIAGNOSIS — M6281 Muscle weakness (generalized): Secondary | ICD-10-CM | POA: Diagnosis not present

## 2019-08-13 DIAGNOSIS — M545 Low back pain: Secondary | ICD-10-CM | POA: Diagnosis present

## 2019-08-13 DIAGNOSIS — R2681 Unsteadiness on feet: Secondary | ICD-10-CM

## 2019-08-13 NOTE — Therapy (Signed)
[facility] [address] [place], [place], [address] Phone: [phone]   Fax:  [phone]  Physical Therapy Treatment  Patient Details  Name: Natasha BassetJanice V Barnett MRN: [id number] Date of Birth: 03/12/1930 Referring Provider (PT): Cristopher PeruHemang Shah, MD   Encounter Date: 08/13/2019  PT End of Session - 08/13/19 1441    Visit Number  56    Number of Visits  110    Date for PT Re-Evaluation  10/10/19    Authorization Type  6    Authorization Time Period  10    PT Start Time  1441    PT Stop Time  1535    PT Time Calculation (min)  54 min    Activity Tolerance  Patient tolerated treatment well;Patient limited by fatigue    Behavior During Therapy  [facility] for tasks assessed/performed       Past Medical History:  Diagnosis Date  . Anemia   . GERD (gastroesophageal reflux disease)   . Hypertension   . Peripheral vascular disease (HCC)    possible neuropathies in lower extremeties  . PONV (postoperative nausea and vomiting)    happens sometimes but better with pre med of zofran  . Presence of permanent cardiac pacemaker   . Syncope     Past Surgical History:  Procedure Laterality Date  . ABDOMINAL HYSTERECTOMY  1969  . APPENDECTOMY  1969   with hysterectomy  . BACK SURGERY  2001   rods in back. surgery on back x 3  . COLONOSCOPY    . I&D EXTREMITY Left 03/27/2018   Procedure: IRRIGATION AND DEBRIDEMENT LEFT ELBOW / OLECRANON BURSA;  Surgeon: Christena FlakePoggi, John J, MD;  Location: [facility] ORS;  Service: Orthopedics;  Laterality: Left;  . I&D EXTREMITY Left 06/05/2018   Procedure: IRRIGATION AND DEBRIDEMENT EXTREMITY;  Surgeon: Christena FlakePoggi, John J, MD;  Location: [facility] ORS;  Service: Orthopedics;  Laterality: Left;  . INSERT / REPLACE / REMOVE PACEMAKER  2004  . JOINT REPLACEMENT Left 2004   partial knee replacement  . OLECRANON BURSECTOMY Left 03/27/2018   Procedure: LEFT OLECRANON BURSA;  Surgeon: Christena FlakePoggi, John J, MD;  Location: [facility] ORS;   Service: Orthopedics;  Laterality: Left;  . PACEMAKER INSERTION  2014   dual chamber for complete heart block    There were no vitals filed for this visit.  Subjective Assessment - 08/13/19 1442    Subjective  R leg is sore. Had a staph infection. Does not know if it is still there. Her leg is not healed yet. Walking is not as good. R leg does not hurt all the time but it hurts at night.  Has been doing a lot of sitting. Her husband also died recently.    Pertinent History  Imparied gait. Pt states that she has had balance issues for too long. Feels more comfortable walking with her rw. Had tests which showed that she has neuropathy. [facility] her balance is coming from her back. Just wants to be strong so she can walk.  Started walking with her rw off and on for the past several months.  Also feels like she might need a new sleep number bed. Also had 2 wound surgeries at [name]. Pt fell in February 2019. Had to stop PT due to the L elbow surgery April 06, 2018 to remove a bursa. The wound still has not healed.   Currently sees a wound doctor.   Feels  numbness L foot from the ankle down.  No R foot numbness.  Sometimes unsure of where her L LE is in space.  Pt also states that she tires too quickly.  Has not been able to walk her dog in 2 years. Has not really been doing exercises at home except the sit <> stand. Pt states that prolonged sitting without back support will increase back pain     Currently in Pain?  Other (Comment)   no back pain but has R leg pain from wound        [facility] PT Assessment - 08/13/19 1512      Strength   Right Hip Flexion  4-/5    Right Hip Extension  4/5   seated manually resisted   Right Hip ABduction  4-/5   seated manually resisted clamshell   Left Hip Flexion  4+/5    Left Hip Extension  4/5   seated manually resisted   Left Hip ABduction  4/5   seated manually resisted clamshell   Right Knee Flexion  4+/5    Right  Knee Extension  4/5    Left Knee Flexion  4+/5    Left Knee Extension  5/5      Dynamic Gait Index   Level Surface  Mild Impairment    Change in Gait Speed  Severe Impairment    Gait with Horizontal Head Turns  Severe Impairment    DGI comment:  Pt requested to stop                           PT Education - 08/13/19 1451    Education provided  Yes    Education Details  ther-ex    [employer]) Educated  Patient    Methods  Explanation;Demonstration;Tactile cues;Verbal cues    Comprehension  Returned demonstration;Verbalized understanding          OBJECTIVE  Pt ambulating with rw   No latex band allergies    MedbridgeAccess Code: [email or web address]   Pt has pacemaker  Wound with dressing R tibialis anterior muscle.  Therapeutic exercise:   Gait x 10 meters with SPC CGA 2x   35 seconds, 42 seconds (38.5 seconds average; 0.26 m/s)     seated trunk extension isometrics at neutral, manually resisted   10x5 seconds   Then 10x10secondsfor2sets  Directed patient with gait with normal gait speed, with changes in speed with R and L cervical rotation position,   Sit <> stand with B UE assist, emphasis on eccentric lowering to promote LE strength.   Seated manually resisted hip extension, clamehell isometrics, hip flexion, knee flexion, knee extension    Seated manually resisted clamshell isometrics 10x5 seconds for 3 sets  Seated hip adduction folded pillow and glute max squeeze 10x3 with 5 second holds   Reviewed plan of care: 2x/week for 8 weeks    Improved exercise technique, movement at target joints, use of target muscles after mod verbal, visual, tactile cues.         Patient response to treatment: Good muscle use felt with exercises. No complain of anterior leg pain today with gait or exercises.     Clinical Impression Pt returns to PT after a 1 month hiatus secondary to her husband passing away. Per pt  reports, she has not performed much physical activity and has done a lot of sitting instead. She currently presents with similar strength compared to previous measurement on 06/18/19 with slight weakness  in some muscles, decreased gait speed, increased fear of falling observed, and difficulty performing the DGI secondary to unsteadiness with gait using the [facility]. Pt will benefit from continued skilled physical therapy services to regain, strength, balance, and function.       PT Short Term Goals - 04/25/19 1723      PT SHORT TERM GOAL #1   Title  Patient will be independent with her HEP to improve strength and balance.     Time  3    Period  Weeks    Status  On-going    Target Date  05/08/19        PT Long Term Goals - 08/13/19 1607      PT LONG TERM GOAL #1   Title  Patient will improve her 10 MWT to 0.8 m/s or more with rw to promote better community ambulation.     Baseline  0.5 seconds with rw (09/06/2018); 0.63 seconds average with rw (10/18/2018); 0.69 m/s average using rw (12/10/2018); 0.59 m/s using SPC and CGA (01/08/2019); 0.60 m/s using SPC (01/21/2019); 0.56 m/s with SPC to no AD assist, CGA (02/04/2019); 0.45 m/s average (04/17/2019); 0.53 m/s with SPC (06/06/2019); 0.26 m/s average wiht SPC (08/13/2019)    Time  8    Period  Weeks    Status  On-going    Target Date  10/10/19      PT LONG TERM GOAL #2   Title  Patient will improve her DGI score using rw or least restrictive AD to 19/24 or more to promote balance.     Baseline  13/24 using rw (09/06/2018); 14/24 using rw (10/29/2018); 16/24 (12/10/2018); 14/24 using SPC (01/21/2019); 9/24 using SPC (04/17/2019); 14/24 (06/11/2019; unable to complete test today secondary to pt request (08/13/2019)    Time  8    Period  Weeks    Status  On-going    Target Date  10/10/19      PT LONG TERM GOAL #3   Title  Patient will improve bilateral LE strength by at least 1/2 MMT to promote ability to support herself when performing standing tasks and  improve balance.     Time  8    Period  Weeks    Status  On-going    Target Date  10/10/19      PT LONG TERM GOAL #4   Title  Patient will have a decrease in back pain/ache to 5/10 or less to promote ability to perform standing tasks with less difficulty.    Baseline  8/10 at most for the past 3 months (09/06/2018); 5-6/10 at worst for the past 7 days (10/18/2018); 5/10 at worst for the past 7 days (12/10/2018)    Time  6    Period  Weeks    Status  Achieved            Plan - 08/13/19 1455    Clinical Impression Statement  Pt returns to PT after a 1 month hiatus secondary to her husband passing away. Per pt reports, she has not performed much physical activity and has done a lot of sitting instead. She currently presents with similar strength compared to previous measurement on 06/18/19 with slight weakness in some muscles, decreased gait speed, increased fear of falling observed, and difficulty performing the DGI secondary to unsteadiness with gait using the [facility]. Pt will benefit from continued skilled physical therapy services to regain, strength, balance, and function.    Personal Factors and Comorbidities  Age;Comorbidity 3+;Time since  onset of injury/illness/exacerbation;Past/Current Experience    Comorbidities  Pacemaker, L elbow and R tibialis anterior wounds with difficulty healing, back surgery    Examination-Activity Limitations  Carry;Locomotion Level;Stairs    Stability/Clinical Decision Making  Evolving/Moderate complexity    Clinical Decision Making  Moderate    Clinical Presentation due to:  decreased gait speed and balance    Rehab Potential  Fair    Clinical Impairments Affecting Rehab Potential  age, fear of falling, weakness    PT Frequency  2x / week    PT Duration  8 weeks    PT Treatment/Interventions  Aquatic Therapy;Gait training;Therapeutic activities;Therapeutic exercise;Balance training;Neuromuscular re-education;Patient/family education;Manual techniques;Dry  needling    PT Next Visit Plan  trunk, hip, scapular strengthening, balance, gait, manual techniques PRN    PT Home Exercise Plan  --    Consulted and Agree with Plan of Care  Patient       Patient will benefit from skilled therapeutic intervention in order to improve the following deficits and impairments:  Abnormal gait, Decreased activity tolerance, Decreased balance, Decreased endurance, Decreased range of motion, Decreased strength, Difficulty walking, Improper body mechanics, Postural dysfunction, Pain, Decreased knowledge of use of DME  Visit Diagnosis: Muscle weakness (generalized) - Plan: PT plan of care cert/re-cert  Difficulty in walking, not elsewhere classified - Plan: PT plan of care cert/re-cert  History of falling - Plan: PT plan of care cert/re-cert  Unsteadiness on feet - Plan: PT plan of care cert/re-cert  Chronic low back pain, unspecified back pain laterality, unspecified whether sciatica present - Plan: PT plan of care cert/re-cert     Problem List Patient Active Problem List   Diagnosis Date Noted  . Septic olecranon bursitis of left elbow 03/27/2018  . Lymphedema 10/11/2017  . Chronic venous insufficiency 10/11/2017  . Leg pain 10/11/2017  . Leg swelling 10/11/2017  . COPD (chronic obstructive pulmonary disease) ([place]) 10/11/2017  . Essential hypertension 10/11/2017    Joneen Boers PT, DPT   08/13/2019, 4:36 PM  [facility] [address] [place], [address] Phone: [phone]   Fax:  [phone]  Name: [name] MRN: [id number] Date of Birth: 03-18-1930

## 2019-08-15 ENCOUNTER — Other Ambulatory Visit: Payer: Self-pay

## 2019-08-15 ENCOUNTER — Ambulatory Visit: Payer: Medicare Other

## 2019-08-15 ENCOUNTER — Encounter: Payer: Medicare Other | Attending: Physician Assistant | Admitting: Physician Assistant

## 2019-08-15 DIAGNOSIS — W541XXA Struck by dog, initial encounter: Secondary | ICD-10-CM | POA: Insufficient documentation

## 2019-08-15 DIAGNOSIS — S81801A Unspecified open wound, right lower leg, initial encounter: Secondary | ICD-10-CM | POA: Diagnosis not present

## 2019-08-15 DIAGNOSIS — L97812 Non-pressure chronic ulcer of other part of right lower leg with fat layer exposed: Secondary | ICD-10-CM | POA: Insufficient documentation

## 2019-08-15 DIAGNOSIS — R262 Difficulty in walking, not elsewhere classified: Secondary | ICD-10-CM

## 2019-08-15 DIAGNOSIS — M351 Other overlap syndromes: Secondary | ICD-10-CM | POA: Insufficient documentation

## 2019-08-15 DIAGNOSIS — M6281 Muscle weakness (generalized): Secondary | ICD-10-CM

## 2019-08-15 DIAGNOSIS — Z9181 History of falling: Secondary | ICD-10-CM

## 2019-08-15 DIAGNOSIS — R2681 Unsteadiness on feet: Secondary | ICD-10-CM

## 2019-08-15 DIAGNOSIS — M069 Rheumatoid arthritis, unspecified: Secondary | ICD-10-CM | POA: Diagnosis not present

## 2019-08-15 DIAGNOSIS — Z79899 Other long term (current) drug therapy: Secondary | ICD-10-CM | POA: Insufficient documentation

## 2019-08-15 NOTE — Therapy (Signed)
[facility]ICINE [address] [place], [place], [address] Phone: [phone]   Fax:  [phone]  Physical Therapy Treatment  Patient Details  Name: [name] MRN: [id number] Date of Birth: 06/06/1930 Referring Provider (PT): Cristopher PeruHemang Shah, MD   Encounter Date: 08/15/2019  PT End of Session - 08/15/19 1433    Visit Number  57    Number of Visits  110    Date for PT Re-Evaluation  10/10/19    Authorization Type  7    Authorization Time Period  10    PT Start Time  1433    PT Stop Time  1519    PT Time Calculation (min)  46 min    Activity Tolerance  Patient tolerated treatment well;Patient limited by fatigue    Behavior During Therapy  [facility] for tasks assessed/performed       Past Medical History:  Diagnosis Date  . Anemia   . GERD (gastroesophageal reflux disease)   . Hypertension   . Peripheral vascular disease (HCC)    possible neuropathies in lower extremeties  . PONV (postoperative nausea and vomiting)    happens sometimes but better with pre med of zofran  . Presence of permanent cardiac pacemaker   . Syncope     Past Surgical History:  Procedure Laterality Date  . ABDOMINAL HYSTERECTOMY  1969  . APPENDECTOMY  1969   with hysterectomy  . BACK SURGERY  2001   rods in back. surgery on back x 3  . COLONOSCOPY    . I&D EXTREMITY Left 03/27/2018   Procedure: IRRIGATION AND DEBRIDEMENT LEFT ELBOW / OLECRANON BURSA;  Surgeon: Christena FlakePoggi, John J, MD;  Location: [facility] ORS;  Service: Orthopedics;  Laterality: Left;  . I&D EXTREMITY Left 06/05/2018   Procedure: IRRIGATION AND DEBRIDEMENT EXTREMITY;  Surgeon: Christena FlakePoggi, John J, MD;  Location: [facility] ORS;  Service: Orthopedics;  Laterality: Left;  . INSERT / REPLACE / REMOVE PACEMAKER  2004  . JOINT REPLACEMENT Left 2004   partial knee replacement  . OLECRANON BURSECTOMY Left 03/27/2018   Procedure: LEFT OLECRANON BURSA;  Surgeon: Christena FlakePoggi, John J, MD;  Location: [facility] ORS;   Service: Orthopedics;  Laterality: Left;  . PACEMAKER INSERTION  2014   dual chamber for complete heart block    There were no vitals filed for this visit.  Subjective Assessment - 08/15/19 1436    Subjective  Went to the wound center this morning. R leg is not infected anymore. Still swolen and drains which slows the healing down.    Pertinent History  Imparied gait. Pt states that she has had balance issues for too long. Feels more comfortable walking with her rw. Had tests which showed that she has neuropathy. [facility] is coming from her back. Just wants to be strong so she can walk.  Started walking with her rw off and on for the past several months.  Also feels like she might need a new sleep number bed. Also had 2 wound surgeries at [name]. Pt fell in February 2019. Had to stop PT due to the L elbow surgery April 06, 2018 to remove a bursa. The wound still has not healed.   Currently sees a wound doctor.   Feels numbness L foot from the ankle down.  No R foot numbness.  Sometimes unsure of where her L LE is in space.  Pt also states that she tires too  quickly.  Has not been able to walk her dog in 2 years. Has not really been doing exercises at home except the sit <> stand. Pt states that prolonged sitting without back support will increase back pain     Currently in Pain?  No/denies   not other than her R leg                              PT Education - 08/15/19 1439    Education provided  Yes    Education Details  ther-ex    Person(s) Educated  Patient    Methods  Explanation;Demonstration;Tactile cues;Verbal cues    Comprehension  Returned demonstration;Verbalized understanding      OBJECTIVE  Pt ambulating with rw   No latex band allergies    MedbridgeAccess Code: [email or web address]   Pt has pacemaker  Wound with dressing R tibialis anterior muscle.  Therapeutic exercise:  seated  trunk extension isometrics at neutral, manually resisted                           Then 10x10secondsfor2sets  Standing mini squat without UE assist 10x2  SLS with light touch UE assist to promote glute med muscle strengthening and balance.   L 10x5 seconds  R 10x5 seconds  Gait with handheld CGA 150 ft  Attempted use of SPC but pt unable to take a step secondary to feeling more confident with holding PT hand instead.   Stepping over 2 mini hurdles with PT hand held assist   2x R LE  2x L LE  Forward step up onto 4 inch step with PT hand held assist and min A  R 5x  L 5x  Some difficulty observed when pt did not use the treadmill bar for support   Standing hip abduction with B UE assist   R 10x 5 seconds  L 10x 5 seconds   Seated manually resisted clamshell isometrics 10x5 seconds for 3 sets to promote glute med muscle strength.     Improved exercise technique, movement at target joints, use of target muscles after mod verbal, visual, tactile cues.         Patient response to treatment: Good muscle use felt with exercises.No complain of anterior leg pain today with gait or exercises.    Clinical Impression  Continued working on glute med strengthening to promote single leg stance strength and balance during stance phase of gait to decrease fall risk, difficulty ambulating with least restrictive AD and improve balance confidence. Unable to ambulate with SPC today secondary to fear of falling observed. Unable to take a step with the AD on R side but able to take a step with R hand held assist from PT secondary to being more confident with PT support. Pt will benefit from continued skilled physical therapy services to improve LE strength, balance, function and decrease difficulty ambulating.        PT Short Term Goals - 04/25/19 1723      PT SHORT TERM GOAL #1   Title  Patient will be independent with her HEP to improve strength and balance.      Time  3    Period  Weeks    Status  On-going    Target Date  05/08/19        PT Long Term Goals - 08/13/19 1607      PT LONG TERM GOAL #  1   Title  Patient will improve her 10 MWT to 0.8 m/s or more with rw to promote better community ambulation.     Baseline  0.5 seconds with rw (09/06/2018); 0.63 seconds average with rw (10/18/2018); 0.69 m/s average using rw (12/10/2018); 0.59 m/s using SPC and CGA (01/08/2019); 0.60 m/s using SPC (01/21/2019); 0.56 m/s with SPC to no AD assist, CGA (02/04/2019); 0.45 m/s average (04/17/2019); 0.53 m/s with SPC (06/06/2019); 0.26 m/s average wiht SPC (08/13/2019)    Time  8    Period  Weeks    Status  On-going    Target Date  10/10/19      PT LONG TERM GOAL #2   Title  Patient will improve her DGI score using rw or least restrictive AD to 19/24 or more to promote balance.     Baseline  13/24 using rw (09/06/2018); 14/24 using rw (10/29/2018); 16/24 (12/10/2018); 14/24 using SPC (01/21/2019); 9/24 using SPC (04/17/2019); 14/24 (06/11/2019; unable to complete test today secondary to pt request (08/13/2019)    Time  8    Period  Weeks    Status  On-going    Target Date  10/10/19      PT LONG TERM GOAL #3   Title  Patient will improve bilateral LE strength by at least 1/2 MMT to promote ability to support herself when performing standing tasks and improve balance.     Time  8    Period  Weeks    Status  On-going    Target Date  10/10/19      PT LONG TERM GOAL #4   Title  Patient will have a decrease in back pain/ache to 5/10 or less to promote ability to perform standing tasks with less difficulty.    Baseline  8/10 at most for the past 3 months (09/06/2018); 5-6/10 at worst for the past 7 days (10/18/2018); 5/10 at worst for the past 7 days (12/10/2018)    Time  6    Period  Weeks    Status  Achieved            Plan - 08/15/19 1439    Clinical Impression Statement  Continued working on glute med strengthening to promote single leg stance strength and  balance during stance phase of gait to decrease fall risk, difficulty ambulating with least restrictive AD and improve balance confidence. Unable to ambulate with SPC today secondary to fear of falling observed. Unable to take a step with the AD on R side but able to take a step with R hand held assist from PT secondary to being more confident with PT support. Pt will benefit from continued skilled physical therapy services to improve LE strength, balance, function and decrease difficulty ambulating.    Personal Factors and Comorbidities  Age;Comorbidity 3+;Time since onset of injury/illness/exacerbation;Past/Current Experience    Comorbidities  Pacemaker, L elbow and R tibialis anterior wounds with difficulty healing, back surgery    Examination-Activity Limitations  Carry;Locomotion Level;Stairs    Stability/Clinical Decision Making  Evolving/Moderate complexity    Rehab Potential  Fair    Clinical Impairments Affecting Rehab Potential  age, fear of falling, weakness    PT Frequency  2x / week    PT Duration  8 weeks    PT Treatment/Interventions  Aquatic Therapy;Gait training;Therapeutic activities;Therapeutic exercise;Balance training;Neuromuscular re-education;Patient/family education;Manual techniques;Dry needling    PT Next Visit Plan  trunk, hip, scapular strengthening, balance, gait, manual techniques PRN    Consulted and Agree with Plan  of Care  Patient       Patient will benefit from skilled therapeutic intervention in order to improve the following deficits and impairments:  Abnormal gait, Decreased activity tolerance, Decreased balance, Decreased endurance, Decreased range of motion, Decreased strength, Difficulty walking, Improper body mechanics, Postural dysfunction, Pain, Decreased knowledge of use of DME  Visit Diagnosis: Muscle weakness (generalized)  Difficulty in walking, not elsewhere classified  History of falling  Unsteadiness on feet     Problem List Patient  Active Problem List   Diagnosis Date Noted  . Septic olecranon bursitis of left elbow 03/27/2018  . Lymphedema 10/11/2017  . Chronic venous insufficiency 10/11/2017  . Leg pain 10/11/2017  . Leg swelling 10/11/2017  . COPD (chronic obstructive pulmonary disease) ([place]) 10/11/2017  . Essential hypertension 10/11/2017    Joneen Boers PT, DPT   08/15/2019, 3:57 PM  [place] [facility] [address], [place], [address] Phone: [phone]   Fax:  [phone]  Name: EMALY BOSCHERT MRN: [id number] Date of Birth: 10-06-1930

## 2019-08-17 NOTE — Progress Notes (Signed)
[name], [name] ([id number]) Visit Report for 08/15/2019 Arrival Information Details Patient Name: [name], [name]. Date of Service: 08/15/2019 11:00 AM Medical Record Number: [id number] Patient Account Number: [id number] Date of Birth/Sex: [birth year] (85 y.o. Female) Treating RN: Army Melia Primary Care Xachary Hambly: Fulton Reek Other Clinician: Referring Prestyn Stanco: Fulton Reek Treating Tanya Marvin/Extender: Melburn Hake, HOYT Weeks in Treatment: 6 Visit Information History Since Last Visit Added or deleted any medications: No Patient Arrived: [name] Any new allergies or adverse reactions: No Arrival Time: 11:08 Had a fall or experienced change in No Accompanied By: self activities of daily living that may affect Transfer Assistance: None risk of falls: Patient Identification Verified: Yes Signs or symptoms of abuse/neglect since last visito No Secondary Verification Process Completed: Yes Hospitalized since last visit: No Implantable device outside of the clinic excluding No cellular tissue based products placed in the center since last visit: Has Dressing in Place as Prescribed: Yes Pain Present Now: Yes Electronic Signature(s) Signed: 08/15/2019 3:22:37 PM By: Lorine Bears RCP, RRT, CHT Entered By: Lorine Bears on 08/15/2019 11:09:44 [name] ([id number]) -------------------------------------------------------------------------------- Encounter Discharge Information Details Patient Name: [name] Date of Service: 08/15/2019 11:00 AM Medical Record Number: [id number] Patient Account Number: [id number] Date of Birth/Sex: 1930-12-02 (85 y.o. Female) Treating RN: Army Melia Primary Care Dale Strausser: Fulton Reek Other Clinician: Referring Rockie Schnoor: Fulton Reek Treating Murrel Freet/Extender: Melburn Hake, HOYT Weeks in Treatment: 6 Encounter Discharge Information Items Post Procedure Vitals Discharge Condition: Stable Temperature (F):  98.3 Ambulatory Status: Walker Pulse (bpm): 69 Discharge Destination: Home Respiratory Rate (breaths/min): 16 Transportation: Private Auto Blood Pressure (mmHg): 131/45 Accompanied By: self Schedule Follow-up Appointment: Yes Clinical Summary of Care: Electronic Signature(s) Signed: 08/15/2019 12:59:19 PM By: Army Melia Entered By: Army Melia on 08/15/2019 11:36:48 [name] ([id number]) -------------------------------------------------------------------------------- Lower Extremity Assessment Details Patient Name: [name] Date of Service: 08/15/2019 11:00 AM Medical Record Number: [id number] Patient Account Number: [id number] Date of Birth/Sex: [birth year] (85 y.o. Female) Treating RN: Montey Hora Primary Care Alexea Blase: Fulton Reek Other Clinician: Referring Bradleigh Sonnen: Fulton Reek Treating Maclovio Henson/Extender: Melburn Hake, HOYT Weeks in Treatment: 6 Edema Assessment Assessed: [Left: No] [Right: No] Edema: [Left: Ye] [Right: s] Calf Left: Right: Point of Measurement: 30 cm From Medial Instep cm 33.5 cm Ankle Left: Right: Point of Measurement: 10 cm From Medial Instep cm 24 cm Vascular Assessment Pulses: Dorsalis Pedis Palpable: [Right:Yes] Electronic Signature(s) Signed: 08/15/2019 4:26:35 PM By: Montey Hora Entered By: Montey Hora on 08/15/2019 11:20:00 [name] ([id number]) -------------------------------------------------------------------------------- Multi Wound Chart Details Patient Name: [name] Date of Service: 08/15/2019 11:00 AM Medical Record Number: [id number] Patient Account Number: [id number] Date of Birth/Sex: [birth year] (85 y.o. Female) Treating RN: Army Melia Primary Care Heba Ige: Fulton Reek Other Clinician: Referring Elgene Coral: Fulton Reek Treating Virgel Haro/Extender: Melburn Hake, HOYT Weeks in Treatment: 6 Vital Signs Height(in): 60 Pulse(bpm): 69 Weight(lbs): 125 Blood Pressure(mmHg): 131/45 Body  Mass Index(BMI): 24 Temperature(F): 98.3 Respiratory Rate 18 (breaths/min): Photos: [N/A:N/A] Wound Location: Right Lower Leg - Anterior N/A N/A Wounding Event: Trauma N/A N/A Primary Etiology: Skin Tear N/A N/A Comorbid History: Cataracts, Glaucoma, Optic N/A N/A Neuritis, Chronic sinus problems/congestion, Middle ear problems Date Acquired: 06/27/2019 N/A N/A Weeks of Treatment: 6 N/A N/A Wound Status: Open N/A N/A Measurements L x W x D 2.8x3.9x0.2 N/A N/A (cm) Area (cm) : 8.577 N/A N/A Volume (cm) : 1.715 N/A N/A % Reduction in Area: 19.70% N/A N/A % Reduction in Volume: 67.90% N/A N/A Classification: Partial Thickness N/A N/A  Exudate Amount: Medium N/A N/A Exudate Type: Sanguinous N/A N/A Exudate Color: red N/A N/A Wound Margin: Flat and Intact N/A N/A Granulation Amount: Medium (34-66%) N/A N/A Granulation Quality: Red N/A N/A Necrotic Amount: Medium (34-66%) N/A N/A Exposed Structures: Fat Layer (Subcutaneous N/A N/A Tissue) Exposed: Yes Fascia: No Tendon: No Muscle: No [name], [name] ([id number]) Joint: No Bone: No Epithelialization: None N/A N/A Treatment Notes Electronic Signature(s) Signed: 08/15/2019 12:59:19 PM By: Rodell Perna Entered By: Rodell Perna on 08/15/2019 11:31:37 [name] ([id number]) -------------------------------------------------------------------------------- Multi-Disciplinary Care Plan Details Patient Name: [name] Date of Service: 08/15/2019 11:00 AM Medical Record Number: [id number] Patient Account Number: [id number] Date of Birth/Sex: [birth year] (85 y.o. Female) Treating RN: Rodell Perna Primary Care Lalaine Overstreet: Aram Beecham Other Clinician: Referring Zakai Gonyea: Aram Beecham Treating Loyal Rudy/Extender: Linwood Dibbles, HOYT Weeks in Treatment: 6 Active Inactive Abuse / Safety / Falls / Self Care Management Nursing Diagnoses: Potential for falls Goals: Patient will remain injury free related to falls Date  Initiated: 06/28/2019 Target Resolution Date: 09/21/2019 Goal Status: Active Interventions: Assess fall risk on admission and as needed Notes: Wound/Skin Impairment Nursing Diagnoses: Impaired tissue integrity Goals: Ulcer/skin breakdown will heal within 14 weeks Date Initiated: 06/28/2019 Target Resolution Date: 09/21/2019 Goal Status: Active Interventions: Assess patient/caregiver ability to obtain necessary supplies Assess patient/caregiver ability to perform ulcer/skin care regimen upon admission and as needed Assess ulceration(s) every visit Notes: Electronic Signature(s) Signed: 08/15/2019 12:59:19 PM By: Rodell Perna Entered By: Rodell Perna on 08/15/2019 11:31:13 [name] ([id number]) -------------------------------------------------------------------------------- Pain Assessment Details Patient Name: [name] Date of Service: 08/15/2019 11:00 AM Medical Record Number: [id number] Patient Account Number: [id number] Date of Birth/Sex: 09/08/1930 (85 y.o. Female) Treating RN: Rodell Perna Primary Care Cynthia Cogle: Aram Beecham Other Clinician: Referring Ethelene Closser: Aram Beecham Treating Kenesha Moshier/Extender: Linwood Dibbles, HOYT Weeks in Treatment: 6 Active Problems Location of Pain Severity and Description of Pain Patient Has Paino Yes Site Locations Rate the pain. Current Pain Level: 6 Pain Management and Medication Current Pain Management: Electronic Signature(s) Signed: 08/15/2019 12:59:19 PM By: Rodell Perna Signed: 08/15/2019 3:22:37 PM By: Dayton Martes RCP, RRT, CHT Entered By: Dayton Martes on 08/15/2019 11:09:57 [name] ([id number]) -------------------------------------------------------------------------------- Patient/Caregiver Education Details Patient Name: [name] Date of Service: 08/15/2019 11:00 AM Medical Record Number: [id number] Patient Account Number: [id number] Date of Birth/Gender: [birth year] (85  y.o. Female) Treating RN: Rodell Perna Primary Care Physician: Aram Beecham Other Clinician: Referring Physician: Aram Beecham Treating Physician/Extender: Skeet Simmer in Treatment: 6 Education Assessment Education Provided To: Patient Education Topics Provided Wound/Skin Impairment: Handouts: Caring for Your Ulcer Methods: Demonstration, Explain/Verbal Responses: State content correctly Electronic Signature(s) Signed: 08/15/2019 12:59:19 PM By: Rodell Perna Entered By: Rodell Perna on 08/15/2019 11:35:24 [name] ([id number]) -------------------------------------------------------------------------------- Wound Assessment Details Patient Name: [name] Date of Service: 08/15/2019 11:00 AM Medical Record Number: [id number] Patient Account Number: [id number] Date of Birth/Sex: [birth year] (85 y.o. Female) Treating RN: Curtis Sites Primary Care Cristabel Bicknell: Aram Beecham Other Clinician: Referring Tedi Hughson: Aram Beecham Treating Kimerly Rowand/Extender: Linwood Dibbles, HOYT Weeks in Treatment: 6 Wound Status Wound Number: 11 Primary Skin Tear Etiology: Wound Location: Right Lower Leg - Anterior Wound Open Wounding Event: Trauma Status: Date Acquired: 06/27/2019 Comorbid Cataracts, Glaucoma, Optic Neuritis, Chronic Weeks Of Treatment: 6 History: sinus problems/congestion, Middle ear problems Clustered Wound: No Photos Wound Measurements Length: (cm) 2.8 Width: (cm) 3.9 Depth: (cm) 0.2 Area: (cm) 8.577 Volume: (cm) 1.715 % Reduction in Area: 19.7% % Reduction in Volume:  67.9% Epithelialization: None Tunneling: No Undermining: No Wound Description Classification: Partial Thickness Foul Odor Af Wound Margin: Flat and Intact Slough/Fibri Exudate Amount: Medium Exudate Type: Sanguinous Exudate Color: red ter Cleansing: No no Yes Wound Bed Granulation Amount: Medium (34-66%) Exposed Structure Granulation Quality: Red Fascia Exposed:  No Necrotic Amount: Medium (34-66%) Fat Layer (Subcutaneous Tissue) Exposed: Yes Necrotic Quality: Adherent Slough Tendon Exposed: No Muscle Exposed: No Joint Exposed: No Bone Exposed: No Treatment Notes [name], [name] ([id number]) Wound #11 (Right, Anterior Lower Leg) Notes Hblue, zinc barrier,, ABD, conform tubi grip F Electronic Signature(s) Signed: 08/15/2019 4:26:35 PM By: Curtis Sites Entered By: Curtis Sites on 08/15/2019 11:23:39 [name] ([id number]) -------------------------------------------------------------------------------- Vitals Details Patient Name: [name] Date of Service: 08/15/2019 11:00 AM Medical Record Number: [id number] Patient Account Number: [id number] Date of Birth/Sex: [birth year] (85 y.o. Female) Treating RN: Rodell Perna Primary Care Baylynn Shifflett: Aram Beecham Other Clinician: Referring Lylla Eifler: Aram Beecham Treating Cristie Mckinney/Extender: Linwood Dibbles, HOYT Weeks in Treatment: 6 Vital Signs Time Taken: 11:10 Temperature (F): 98.3 Height (in): 60 Pulse (bpm): 69 Weight (lbs): 125 Respiratory Rate (breaths/min): 18 Body Mass Index (BMI): 24.4 Blood Pressure (mmHg): 131/45 Reference Range: 80 - 120 mg / dl Electronic Signature(s) Signed: 08/15/2019 3:22:37 PM By: Dayton Martes RCP, RRT, CHT Entered By: Dayton Martes on 08/15/2019 11:13:55

## 2019-08-20 ENCOUNTER — Ambulatory Visit: Payer: Medicare Other

## 2019-08-20 ENCOUNTER — Other Ambulatory Visit: Payer: Self-pay

## 2019-08-20 DIAGNOSIS — Z9181 History of falling: Secondary | ICD-10-CM

## 2019-08-20 DIAGNOSIS — M6281 Muscle weakness (generalized): Secondary | ICD-10-CM | POA: Diagnosis not present

## 2019-08-20 DIAGNOSIS — R262 Difficulty in walking, not elsewhere classified: Secondary | ICD-10-CM

## 2019-08-20 DIAGNOSIS — R2681 Unsteadiness on feet: Secondary | ICD-10-CM

## 2019-08-20 NOTE — Therapy (Signed)
[place] [facility] [address], [place], [address] Phone: [phone]   Fax:  [phone]  Physical Therapy Treatment  Patient Details  Name: [name] MRN: [id number] Date of Birth: 1929/12/28 Referring Provider (PT): Jennings Books, MD   Encounter Date: 08/20/2019  PT End of Session - 08/20/19 1434    Visit Number  [id number]    Number of Visits  110    Date for PT Re-Evaluation  10/10/19    Authorization Type  8    Authorization Time Period  10    PT Start Time  1435    PT Stop Time  1517    PT Time Calculation (min)  42 min    Activity Tolerance  Patient tolerated treatment well;Patient limited by fatigue    Behavior During Therapy  [facility] for tasks assessed/performed       Past Medical History:  Diagnosis Date  . Anemia   . GERD (gastroesophageal reflux disease)   . Hypertension   . Peripheral vascular disease (HCC)    possible neuropathies in lower extremeties  . PONV (postoperative nausea and vomiting)    happens sometimes but better with pre med of zofran  . Presence of permanent cardiac pacemaker   . Syncope     Past Surgical History:  Procedure Laterality Date  . ABDOMINAL HYSTERECTOMY  1969  . APPENDECTOMY  1969   with hysterectomy  . BACK SURGERY  2001   rods in back. surgery on back x 3  . COLONOSCOPY    . I&D EXTREMITY Left 03/27/2018   Procedure: IRRIGATION AND DEBRIDEMENT LEFT ELBOW / OLECRANON BURSA;  Surgeon: Corky Mull, MD;  Location: [facility] OR[facility];  Service: Orthopedics;  Laterality: Left;  . I&D EXTREMITY Left 06/05/2018   Procedure: IRRIGATION AND DEBRIDEMENT EXTREMITY;  Surgeon: Corky Mull, MD;  Location: [facility] OR[facility];  Service: Orthopedics;  Laterality: Left;  . INSERT / REPLACE / REMOVE PACEMAKER  2004  . JOINT REPLACEMENT Left 2004   partial knee replacement  . OLECRANON BURSECTOMY Left 03/27/2018   Procedure: LEFT OLECRANON BURSA;  Surgeon: Corky Mull, MD;  Location: [facility] OR[facility];   Service: Orthopedics;  Laterality: Left;  . PACEMAKER INSERTION  2014   dual chamber for complete heart block    There were no vitals filed for this visit.  Subjective Assessment - 08/20/19 1440    Subjective  Had a busy morning. Got a new piece to her mattress. Her walking is not as good and feels like stress is related to it.    Pertinent History  Imparied gait. Pt states that she has had balance issues for too long. Feels more comfortable walking with her rw. Had tests which showed that she has neuropathy. However the doctor at [facility] said that her balance is coming from her back. Just wants to be strong so she can walk.  Started walking with her rw off and on for the past several months.  Also feels like she might need a new sleep number bed. Also had 2 wound surgeries at [name]. Pt fell in February 2019. Had to stop PT due to the L elbow surgery April 06, 2018 to remove a bursa. The wound still has not healed.   Currently sees a wound doctor.   Feels numbness L foot from the ankle down.  No R foot numbness.  Sometimes unsure of where her L LE is in space.  Pt also states that  she tires too quickly.  Has not been able to walk her dog in 2 years. Has not really been doing exercises at home except the sit <> stand. Pt states that prolonged sitting without back support will increase back pain     Currently in Pain?  No/denies                               PT Education - 08/20/19 1449    Education provided  Yes    Education Details  ther-ex    Starwood HotelsPerson(s) Educated  Patient    Methods  Explanation;Demonstration;Tactile cues;Verbal cues    Comprehension  Returned demonstration;Verbalized understanding        OBJECTIVE  Pt ambulating with rw   No latex band allergies    MedbridgeAccess Code: [email or web address]   Pt has pacemaker  Wound with dressing R tibialis anterior muscle.  Therapeutic exercise:  seated trunk extension isometrics at  neutral, manually resisted  Then 10x10secondsfor2sets  Standing hip abduction with B UE assist              R 10x3  3 lbs             L 10x3 3 lbs     Forward step up onto 4 inch step with B UE assist   R 10x, then 5x  L 10x, then 5x  Sit to stand with B UE assist, stand to sit without UE assist. 1 pillow on chair   5x Cues for femoral control    SLS with light touch UE assist to promote glute med muscle strengthening and balance.              L 10x5 seconds             R 10x5 seconds  Gait with hand held assist with PT 100 ft  Very fearful (observed) when trying to ambulate with the [facility] to the point of being unable to move. Able to ambulate with PT handheld assist. Pt states that she feels that it is psychological.     Gait with SPC adjacent to treadmill hand bars. 5 ft x 2. Able to ambulate with SPC.    Improved exercise technique, movement at target joints, use of target muscles after mod verbal, visual, tactile cues.       Patient response to treatment: Good muscle use felt with exercises.  Clinical Impression  Continued working on LE strength to promote balance and ability to support herself when ambulating with least restrictive AD. Pt demonstrates tendency for increased fear of falling observed when trying to ambulate with a SPC with pt being unable to take a step forward. Pt however able to ambulate with PT one hand held assist or when ambulating with SPC adjacent to the sturdy treadmil bar. Pt will benefit from continued skilled physical therapy services to continue to improve LE strength, balance, balance confidence and decrease difficulty with gait. Pt will benefit from contiued skilled treatment in the clinic secondary to her pattern of regression in progress observed when pt does not attend her appointments in the clinic.     PT Short Term Goals - 04/25/19 1723      PT SHORT TERM GOAL #1   Title  Patient will be  independent with her HEP to improve strength and balance.     Time  3    Period  Weeks    Status  On-going  Target Date  05/08/19        PT Long Term Goals - 08/13/19 1607      PT LONG TERM GOAL #1   Title  Patient will improve her 10 MWT to 0.8 m/s or more with rw to promote better community ambulation.     Baseline  0.5 seconds with rw (09/06/2018); 0.63 seconds average with rw (10/18/2018); 0.69 m/s average using rw (12/10/2018); 0.59 m/s using SPC and CGA (01/08/2019); 0.60 m/s using SPC (01/21/2019); 0.56 m/s with SPC to no AD assist, CGA (02/04/2019); 0.45 m/s average (04/17/2019); 0.53 m/s with SPC (06/06/2019); 0.26 m/s average wiht SPC (08/13/2019)    Time  8    Period  Weeks    Status  On-going    Target Date  10/10/19      PT LONG TERM GOAL #2   Title  Patient will improve her DGI score using rw or least restrictive AD to 19/24 or more to promote balance.     Baseline  13/24 using rw (09/06/2018); 14/24 using rw (10/29/2018); 16/24 (12/10/2018); 14/24 using SPC (01/21/2019); 9/24 using SPC (04/17/2019); 14/24 (06/11/2019; unable to complete test today secondary to pt request (08/13/2019)    Time  8    Period  Weeks    Status  On-going    Target Date  10/10/19      PT LONG TERM GOAL #3   Title  Patient will improve bilateral LE strength by at least 1/2 MMT to promote ability to support herself when performing standing tasks and improve balance.     Time  8    Period  Weeks    Status  On-going    Target Date  10/10/19      PT LONG TERM GOAL #4   Title  Patient will have a decrease in back pain/ache to 5/10 or less to promote ability to perform standing tasks with less difficulty.    Baseline  8/10 at most for the past 3 months (09/06/2018); 5-6/10 at worst for the past 7 days (10/18/2018); 5/10 at worst for the past 7 days (12/10/2018)    Time  6    Period  Weeks    Status  Achieved            Plan - 08/20/19 1457    Clinical Impression Statement  Continued working on LE  strength to promote balance and ability to support herself when ambulating with least restrictive AD. Pt demonstrates tendency for increased fear of falling observed when trying to ambulate with a SPC with pt being unable to take a step forward. Pt however able to ambulate with PT one hand held assist or when ambulating with SPC adjacent to the sturdy treadmil bar. Pt will benefit from continued skilled physical therapy services to continue to improve LE strength, balance, balance confidence and decrease difficulty with gait. Pt will benefit from contiued skilled treatment in the clinic secondary to her pattern of regression in progress observed when pt does not attend her appointments in the clinic.    Personal Factors and Comorbidities  Age;Comorbidity 3+;Time since onset of injury/illness/exacerbation;Past/Current Experience    Comorbidities  Pacemaker, L elbow and R tibialis anterior wounds with difficulty healing, back surgery    Examination-Activity Limitations  Carry;Locomotion Level;Stairs    Stability/Clinical Decision Making  Evolving/Moderate complexity    Rehab Potential  Fair    Clinical Impairments Affecting Rehab Potential  age, fear of falling, weakness    PT Frequency  2x / week  PT Duration  8 weeks    PT Treatment/Interventions  Aquatic Therapy;Gait training;Therapeutic activities;Therapeutic exercise;Balance training;Neuromuscular re-education;Patient/family education;Manual techniques;Dry needling    PT Next Visit Plan  trunk, hip, scapular strengthening, balance, gait, manual techniques PRN    Consulted and Agree with Plan of Care  Patient       Patient will benefit from skilled therapeutic intervention in order to improve the following deficits and impairments:  Abnormal gait, Decreased activity tolerance, Decreased balance, Decreased endurance, Decreased range of motion, Decreased strength, Difficulty walking, Improper body mechanics, Postural dysfunction, Pain, Decreased  knowledge of use of DME  Visit Diagnosis: Muscle weakness (generalized)  Difficulty in walking, not elsewhere classified  History of falling  Unsteadiness on feet     Problem List Patient Active Problem List   Diagnosis Date Noted  . Septic olecranon bursitis of left elbow 03/27/2018  . Lymphedema 10/11/2017  . Chronic venous insufficiency 10/11/2017  . Leg pain 10/11/2017  . Leg swelling 10/11/2017  . COPD (chronic obstructive pulmonary disease) (HCC) 10/11/2017  . Essential hypertension 10/11/2017    Loralyn Freshwater PT, DPT   08/20/2019, 6:56 PM  [facility] [address], [place], [address] Phone: [phone]   Fax:  [phone]  Name: [name] MRN: [id number] Date of Birth: 24-Jun-1930

## 2019-08-20 NOTE — Progress Notes (Signed)
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 any signs of infection at this time. That was the big concern currently as far as the patient was concerned. Nonetheless I do feel like she is making progress in regard to the feeling of this ulcer it has been slow. She did see a Engineer, petroleum they are talking about putting her in a brace in order to allow this area to heal more appropriately. 12/19/2018; not much change in this  from the last time I have saw this.'s much smaller area than when she first came in and with less circumferential undermining however this is never really adhered. She is wearing the brace that was given or prescribed to her by plastics. She did not have a procedure offered to attempt to close this. We have been using endoform 1/15; wound actually is not doing as well as last week. She was actually not supposed to come into this clinic again until next week but apparently her attendant noticed some redness increasing pain and she came in early. She reports the same amount of drainage. We have been using endoform. She is approved through puraply however I will only consider starting that next week 1/22; she completed the antibiotics last week. Culture I did was negative. In spite of this there is less erythema and pain complaints in the wound. Puraply #1 applied today 1/29; Puraply #2 today. Wound surface looks a lot better post debridement of adherent fibrinous material. However undermining from 6-12 is measuring worse 2/5; Puraply #3. Using her elbow brace 2/12 puraply #4 2/19 puraply #5. The 9:00 undermining measured at 0.5 cm. Undermining from 4-11 o'clock. Surface of the wound looks better and the circumference of the wound is smaller however the undermining is not really changed 2/26; still not much improvement. She has undermining from 4-9 o'clock 0.9 cm. Surface of the wound covered and adherent debris. 3/4; still no improvement. Undermining from 4-9 o'clock still around a centimeter. Surface of the wound looks somewhat better. No debridement is required we used endoform after we ended the trial of puraply last week 3/11; really no improvement at all. Still 1 cm undermining from roughly 9-3 o'clock. This is about a centimeter. The base of the wound looks fairly healthy. No debridement. We have been using endoform. I am really out of most usual options here. I could consider either another  round of an amniotic advanced treatment product example epifix or perhaps regranex. Understandably the patient is a bit frustrated. We did send her to plastic surgery for a consult. Other than prescribing her a brace to immobilize the elbow they did not think she was a candidate for any further surgery. Notable that the patient is not using the brace today 3/18-Patient returns for attention to the left elbow area which apparently looked red at the home health visit. Patient's elbow  looks the same if not better compared with last visit. The area of ulceration remains the same, the base appears healthy. We are continuing to use endoform she has been encouraged to use the brace to keep the elbow straight 3/25; the patient has an appointment at the [facility] wound care center on 4/2. I had actually put her out indefinitely however she seems to want to come back here every week. This week she complains of increased pain and malodor. Dimensions of the elbow wound are larger. We have been using endoform 4/8; the patient went to [facility] where they apparently gave her meta honey and some border foam with a Tubigrip. She has been using this for a week. She says she was very impressed with them there. They did not offer her any surgical consultation. She seems to be coming back here for a second opinion on this, she does not wish to drive to [facility] every week 1/61; still using Medihoney foam border and a Tubigrip. Actually do not think she has anything on the arm at all specifically she is not using her brace 5/6; she has been using medihoney without a lot of improvement. Undermining maximum at 9:00 at 0.5 cm may be somewhat better. She is still complaining of discomfort. She uses her elbow brace at night but is not using anything on the arm during the day, she finds it too restrictive 5/13; we switch the patient to endoform AG last week. She is complaining of more pain and worried about some circumferential erythema  around the wound. I had planned to consider epifix in this wound however the patient came in with a request to see a plastic surgeon in [place] by the name of Dr. Wyline Mood who cared for a friend of hers. Noteworthy that I have already sent her to one plastic surgeon and she went for another second opinion at [facility] and they apparently did not send her to a plastic surgeon nevertheless the patient is fairly convinced that she might benefit from a skin graft which I am doubtful. She also has rheumatoid arthritis and is on methotrexate. This was originally a surgical wound for a bursectomy a year or 2 ago Readmission: 06/28/19 on evaluation today patient presents today for reevaluation but this is due to a new issue her elbow has completely healed and looks excellent. Her right anterior lower leg has a skin tear which was sustained from her dog who jumped up on her and inadvertently scratch the area causing the skin tear. This happened yesterday. She is having some discomfort but fortunately nothing too significant which is good news. No fevers, chills, nausea, or vomiting noted at this time. The skin fortunately was knocked one completely off and we are gonna see about we approximate in the skin as best we can in using Steri-Strips to hold this in place obviously if we can get some of this to reattach that would be beneficial [name], [name]. ([id number]) 07/05/2019 on evaluation today patient actually appears to be doing okay although unfortunately the skin flap that we were attempting to Steri-Stripped down last week did not take. She is developing a lot of fluid underneath the wound area unfortunately which again is not ideal. I think that this necrotic tissue needs to be removed and again it actually just wiped off during the evaluation today as I was attempting to clean the wound there did not appear to be any significant issues underlying which is good although there was some purulent drainage I  did want to go ahead and see about obtaining a culture from today in order to ensure that the Z-Pak that I placed her on earlier in the week was appropriate for treating what ever infection may be causing the issue currently. This was a deep wound culture obtained today. 07/12/2019 on evaluation today patient actually appears to be doing quite well with regard to her right lower extremity ulcer all things considering. There is still little bit of hematoma not it that is noted in the central portion of the wound along with some necrotic tissue but she is still having quite a bit of discomfort. For that reason I did not perform sharp debridement today although this wound does need some debridement of one type or another. I think we may attempt Iodoflex to see if this can be of benefit. Fortunately there is no signs of active infection at this point. 07/18/19 upon evaluation today patient appears to be doing better with regard to her ulcer on her right lower extremity. She's been tolerating the dressing changes without complication. The good news is she seems to be making excellent progress. Overall I'm pleased with there being no signs of infection. With that being said she does tell me that she's been having some discomfort with the wrap she's unsure of exactly what about the rafters causing her trouble. 07/25/2019 on evaluation today patient appears to be doing a little better in regard to her lower extremity ulcer. Unfortunately the alginate seems to be getting really stuck to the wound bed and surrounding periwound. Fortunately there is no signs of active infection at this time. There does not appear to be any evidence of infection currently. 08/01/2019 on evaluation today patient actually appears to be doing much better with regard to her right lower extremity ulcer. She has been tolerating the dressing changes without complication. Fortunately her wound does not show any signs of infection and seems  to be making good progress. No fevers, chills, nausea, vomiting, or diarrhea. 08/08/2019 on evaluation today patient actually appears to be doing better with regard to her leg ulcer. She has been tolerating the dressing changes without complication. With that being said I think we may want to switch the dressing up today just based on the appearance of the wound bed in general. Fortunately there is no evidence of infection currently. Her pain seems to be much better. Her son is present with her today. 08/15/2019 on evaluation today patient appears to be doing very well with regard to her right lower extremity ulcer. She still complains about feeling like the compression stocking/Tubigrip is too tight for her. Nonetheless I still think this is beneficial and is helping the wound to heal more effectively. Overall I am extremely pleased at this time with what I am seeing. Patient History Information obtained from Patient. Family History Heart Disease - Siblings,Father, No family history of Cancer, Diabetes, Hereditary Spherocytosis, Hypertension, Kidney Disease, Lung Disease, Seizures, Stroke, Thyroid Problems, Tuberculosis. Social History Never smoker, Alcohol Use - Daily, Drug Use - No History, Caffeine Use - Never. Medical History Eyes Patient has history of Cataracts, Glaucoma, Optic Neuritis Ear/Nose/Mouth/Throat Patient has history of Chronic sinus problems/congestion, Middle ear problems Medical And Surgical History Notes Constitutional Symptoms (General Health) Back pain Ear/Nose/Mouth/Throat bilateral hearing aides Natasha BassetHENRY, [name] ([id number]) Review of Systems (ROS) Constitutional Symptoms (General Health) Denies complaints or symptoms of Fatigue, Fever, Chills, Marked Weight Change. Respiratory Denies complaints or symptoms of Chronic or frequent coughs, Shortness of Breath. Cardiovascular Complains  or has symptoms of LE edema. Denies complaints or symptoms of Chest  pain. Psychiatric Denies complaints or symptoms of Anxiety, Claustrophobia. Objective Constitutional Well-nourished and well-hydrated in no acute distress. Vitals Time Taken: 11:10 AM, Height: 60 in, Weight: 125 lbs, BMI: 24.4, Temperature: 98.3 F, Pulse: 69 bpm, Respiratory Rate: 18 breaths/min, Blood Pressure: 131/45 mmHg. Respiratory normal breathing without difficulty. Psychiatric this patient is able to make decisions and demonstrates good insight into disease process. Alert and Oriented x 3. pleasant and cooperative. General Notes: Upon inspection today patient's wound bed actually showed signs of excellent improvement she did require sharp debridement but not nearly as much as we have had to do in the past. Overall I am extremely happy that she is doing as well as she is. Post debridement the wound bed appears to be doing much better though not completely clean and there is much as I could before she had too much discomfort and I had to stop. Integumentary (Hair, Skin) Wound #11 status is Open. Original cause of wound was Trauma. The wound is located on the Right,Anterior Lower Leg. The wound measures 2.8cm length x 3.9cm width x 0.2cm depth; 8.577cm^2 area and 1.715cm^3 volume. There is Fat Layer (Subcutaneous Tissue) Exposed exposed. There is no tunneling or undermining noted. There is a medium amount of sanguinous drainage noted. The wound margin is flat and intact. There is medium (34-66%) red granulation within the wound bed. There is a medium (34-66%) amount of necrotic tissue within the wound bed including Adherent Slough. Assessment Active Problems ICD-10 Unspecified open wound, right lower leg, initial encounter [name], [name] ([id number]) Non-pressure chronic ulcer of other part of right lower leg with fat layer exposed Struck by dog, initial encounter Procedures Wound #11 Pre-procedure diagnosis of Wound #11 is a Skin Tear located on the Right,Anterior Lower Leg .  There was a Excisional Skin/Subcutaneous Tissue Debridement with a total area of 10.92 sq cm performed by STONE III, HOYT E., PA-C. With the following instrument(s): Curette to remove Viable and Non-Viable tissue/material. Material removed includes Subcutaneous Tissue and Slough and after achieving pain control using Lidocaine. A time out was conducted at 11:32, prior to the start of the procedure. A Minimum amount of bleeding was controlled with Silver Nitrate. The procedure was tolerated well. Post Debridement Measurements: 2.8cm length x 3.9cm width x 0.2cm depth; 1.715cm^3 volume. Character of Wound/Ulcer Post Debridement is stable. Post procedure Diagnosis Wound #11: Same as Pre-Procedure Plan Wound Cleansing: Wound #11 Right,Anterior Lower Leg: Clean wound with Normal Saline. May shower with protection. - Please do not get your wrap wet Skin Barriers/Peri-Wound Care: Wound #11 Right,Anterior Lower Leg: Barrier cream - peri-wound Primary Wound Dressing: Wound #11 Right,Anterior Lower Leg: Hydrafera Blue Ready Transfer Secondary Dressing: Wound #11 Right,Anterior Lower Leg: ABD pad Conform/Kerlix Dressing Change Frequency: Wound #11 Right,Anterior Lower Leg: Change dressing every other day. Follow-up Appointments: Wound #11 Right,Anterior Lower Leg: Return Appointment in 1 week. Edema Control: Wound #11 Right,Anterior Lower Leg: Other: - tubi grip F 1. I would recommend that we continue with the current wound care measures which is the [facility] dressing I do believe this has been beneficial for her. 2. I am also going to suggest that we continue with the Tubigrip as this seems to be helpful with maintaining at least some [name], [name] ([id number]) fluid control since she cannot tolerate the wrap. 3. I do recommend she continue to elevate her legs as much as possible this will help with the  edema as well. We will see patient back for reevaluation in 1 week here in the  clinic. If anything worsens or changes patient will contact our office for additional recommendations. Electronic Signature(s) Signed: 08/16/2019 7:42:05 PM By: Lenda Kelp PA-C Previous Signature: 08/16/2019 7:17:29 PM Version By: Lenda Kelp PA-C Entered By: Lenda Kelp on 08/16/2019 19:42:05 [name] ([id number]) -------------------------------------------------------------------------------- ROS/PFSH Details Patient Name: [name] Date of Service: 08/15/2019 11:00 AM Medical Record Number: [id number] Patient Account Number: [id number] Date of Birth/Sex: September 28, 1930 (85 y.o. Female) Treating RN: Rodell Perna Primary Care Provider: Aram Beecham Other Clinician: Referring Provider: Aram Beecham Treating Provider/Extender: Linwood Dibbles, HOYT Weeks in Treatment: 6 Information Obtained From Patient Constitutional Symptoms (General Health) Complaints and Symptoms: Negative for: Fatigue; Fever; Chills; Marked Weight Change Medical History: Past Medical History Notes: Back pain Respiratory Complaints and Symptoms: Negative for: Chronic or frequent coughs; Shortness of Breath Cardiovascular Complaints and Symptoms: Positive for: LE edema Negative for: Chest pain Psychiatric Complaints and Symptoms: Negative for: Anxiety; Claustrophobia Eyes Medical History: Positive for: Cataracts; Glaucoma; Optic Neuritis Ear/Nose/Mouth/Throat Medical History: Positive for: Chronic sinus problems/congestion; Middle ear problems Past Medical History Notes: bilateral hearing aides HBO Extended History Items Ear/Nose/Mouth/Throat: Eyes: Eyes: Ear/Nose/Mouth/Throat: Chronic sinus Cataracts Glaucoma Middle ear problems problems/congestion Immunizations Pneumococcal Vaccine: Received Pneumococcal Vaccination: No [name], [name] ([id number]) Implantable Devices Yes Family and Social History Cancer: No; Diabetes: No; Heart Disease: Yes - Siblings,Father; Hereditary  Spherocytosis: No; Hypertension: No; Kidney Disease: No; Lung Disease: No; Seizures: No; Stroke: No; Thyroid Problems: No; Tuberculosis: No; Never smoker; Alcohol Use: Daily; Drug Use: No History; Caffeine Use: Never; Financial Concerns: No; Food, Clothing or Shelter Needs: No; Support System Lacking: No; Transportation Concerns: No Physician Affirmation I have reviewed and agree with the above information. Electronic Signature(s) Signed: 08/16/2019 7:48:01 PM By: Lenda Kelp PA-C Signed: 08/20/2019 11:37:35 AM By: Rodell Perna Entered By: Lenda Kelp on 08/16/2019 19:16:35 [name] ([id number]) -------------------------------------------------------------------------------- SuperBill Details Patient Name: [name] Date of Service: 08/15/2019 Medical Record Number: [id number] Patient Account Number: [id number] Date of Birth/Sex: 20-May-1930 (85 y.o. Female) Treating RN: Rodell Perna Primary Care Provider: Aram Beecham Other Clinician: Referring Provider: Aram Beecham Treating Provider/Extender: Linwood Dibbles, HOYT Weeks in Treatment: 6 Diagnosis Coding ICD-10 Codes Code Description S81.801A Unspecified open wound, right lower leg, initial encounter L97.812 Non-pressure chronic ulcer of other part of right lower leg with fat layer exposed W54.1XXA Struck by dog, initial encounter Facility Procedures CPT4 Code Description: [id number] [phone] - DEB SUBQ TISSUE 20 SQ CM/< ICD-10 Diagnosis Description L97.812 Non-pressure chronic ulcer of other part of right lower leg wit S81.801A Unspecified open wound, right lower leg, initial encounter Modifier: h fat layer expo Quantity: 1 sed Physician Procedures CPT4 Code Description: [id number] 11042 - WC PHYS SUBQ TISS 20 SQ CM ICD-10 Diagnosis Description L97.812 Non-pressure chronic ulcer of other part of right lower leg wit S81.801A Unspecified open wound, right lower leg, initial encounter Modifier: h fat layer expo Quantity: 1  sed Electronic Signature(s) Signed: 08/15/2019 5:51:42 PM By: Lenda Kelp PA-C Entered By: Lenda Kelp on 08/15/2019 17:51:42

## 2019-08-22 ENCOUNTER — Other Ambulatory Visit: Payer: Self-pay

## 2019-08-22 ENCOUNTER — Encounter: Payer: Medicare Other | Admitting: Physician Assistant

## 2019-08-22 DIAGNOSIS — S81801A Unspecified open wound, right lower leg, initial encounter: Secondary | ICD-10-CM | POA: Diagnosis not present

## 2019-08-22 NOTE — Progress Notes (Signed)
[name], [name] ([id number]) Visit Report for 08/22/2019 Arrival Information Details Patient Name: [name], [name] Date of Service: 08/22/2019 1:00 PM Medical Record Number: [id number] Patient Account Number: [id number] Date of Birth/Sex: 02/03/1930 (85 y.o. F) Treating RN: Rodell PernaScott, Dajea Primary Care Tashay Bozich: Aram BeechamSparks, Jeffrey Other Clinician: Referring Devaris Quirk: Aram BeechamSparks, Jeffrey Treating Kya Mayfield/Extender: Linwood DibblesSTONE III, HOYT Weeks in Treatment: 7 Visit Information History Since Last Visit Added or deleted any medications: No Patient Arrived: Walker Any new allergies or adverse reactions: No Arrival Time: 12:57 Had a fall or experienced change in No Accompanied By: self activities of daily living that may affect Transfer Assistance: None risk of falls: Signs or symptoms of abuse/neglect since last visito No Hospitalized since last visit: No Implantable device outside of the clinic excluding No cellular tissue based products placed in the center since last visit: Has Dressing in Place as Prescribed: Yes Pain Present Now: No Electronic Signature(s) Signed: 08/22/2019 5:06:05 PM By: Dayton MartesWallace, RCP,RRT,CHT, Sallie RCP, RRT, CHT Entered By: Dayton MartesWallace, RCP,RRT,CHT, Sallie on 08/22/2019 12:58:56 [name], [name] ([id number]) -------------------------------------------------------------------------------- Encounter Discharge Information Details Patient Name: [name], [name] Date of Service: 08/22/2019 1:00 PM Medical Record Number: [id number] Patient Account Number: [id number] Date of Birth/Sex: 04/04/1930 (85 y.o. F) Treating RN: Rodell PernaScott, Dajea Primary Care Thais Silberstein: Aram BeechamSparks, Jeffrey Other Clinician: Referring Garen Woolbright: Aram BeechamSparks, Jeffrey Treating Yolanda Huffstetler/Extender: Linwood DibblesSTONE III, HOYT Weeks in Treatment: 7 Encounter Discharge Information Items Post Procedure Vitals Discharge Condition: Stable Temperature (F): 98.3 Ambulatory Status: Walker Pulse (bpm): 72 Discharge Destination: Home Respiratory  Rate (breaths/min): 16 Transportation: Private Auto Blood Pressure (mmHg): 139/54 Accompanied By: self Schedule Follow-up Appointment: Yes Clinical Summary of Care: Electronic Signature(s) Signed: 08/22/2019 1:49:14 PM By: Rodell PernaScott, Dajea Entered By: Rodell PernaScott, Dajea on 08/22/2019 13:33:12 [name], [name] ([id number]) -------------------------------------------------------------------------------- Lower Extremity Assessment Details Patient Name: [name], [name] Date of Service: 08/22/2019 1:00 PM Medical Record Number: [id number] Patient Account Number: [id number] Date of Birth/Sex: 03/13/1930 (85 y.o. F) Treating RN: Curtis Sitesorthy, Joanna Primary Care Tannon Peerson: Aram BeechamSparks, Jeffrey Other Clinician: Referring Alexie Lanni: Aram BeechamSparks, Jeffrey Treating Rosland Riding/Extender: Linwood DibblesSTONE III, HOYT Weeks in Treatment: 7 Edema Assessment Assessed: [Left: No] [Right: No] Edema: [Left: Ye] [Right: s] Calf Left: Right: Point of Measurement: 30 cm From Medial Instep cm 34 cm Ankle Left: Right: Point of Measurement: 10 cm From Medial Instep cm 23 cm Vascular Assessment Pulses: Dorsalis Pedis Palpable: [Right:Yes] Electronic Signature(s) Signed: 08/22/2019 4:59:38 PM By: Curtis Sitesorthy, Joanna Entered By: Curtis Sitesorthy, Joanna on 08/22/2019 13:08:32 [name], [name] ([id number]) -------------------------------------------------------------------------------- Multi Wound Chart Details Patient Name: [name], [name] Date of Service: 08/22/2019 1:00 PM Medical Record Number: [id number] Patient Account Number: [id number] Date of Birth/Sex: 02/19/1930 (85 y.o. F) Treating RN: Rodell PernaScott, Dajea Primary Care Livy Ross: Aram BeechamSparks, Jeffrey Other Clinician: Referring Sie Formisano: Aram BeechamSparks, Jeffrey Treating Felcia Huebert/Extender: Linwood DibblesSTONE III, HOYT Weeks in Treatment: 7 Vital Signs Height(in): 60 Pulse(bpm): 72 Weight(lbs): 125 Blood Pressure(mmHg): 139/54 Body Mass Index(BMI): 24 Temperature(F): 98.3 Respiratory Rate 16 (breaths/min): Photos:  [N/A:N/A] Wound Location: Right Lower Leg - Anterior N/A N/A Wounding Event: Trauma N/A N/A Primary Etiology: Skin Tear N/A N/A Comorbid History: Cataracts, Glaucoma, Optic N/A N/A Neuritis, Chronic sinus problems/congestion, Middle ear problems Date Acquired: 06/27/2019 N/A N/A Weeks of Treatment: 7 N/A N/A Wound Status: Open N/A N/A Measurements L x W x D 2.8x3.2x0.2 N/A N/A (cm) Area (cm) : 7.037 N/A N/A Volume (cm) : 1.407 N/A N/A % Reduction in Area: 34.10% N/A N/A % Reduction in Volume: 73.70% N/A N/A Classification: Partial Thickness N/A N/A Exudate Amount: Medium N/A N/A Exudate Type: Serous N/A  N/A Exudate Color: amber N/A N/A Wound Margin: Flat and Intact N/A N/A Granulation Amount: Medium (34-66%) N/A N/A Granulation Quality: Red N/A N/A Necrotic Amount: Medium (34-66%) N/A N/A Exposed Structures: Fat Layer (Subcutaneous N/A N/A Tissue) Exposed: Yes Fascia: No Tendon: No Muscle: No SHARECE, FLEISCHHACKER ([id number]) Joint: No Bone: No Epithelialization: None N/A N/A Treatment Notes Electronic Signature(s) Signed: 08/22/2019 1:49:14 PM By: Rodell Perna Entered By: Rodell Perna on 08/22/2019 13:24:58 [name] ([id number]) -------------------------------------------------------------------------------- Multi-Disciplinary Care Plan Details Patient Name: [name] Date of Service: 08/22/2019 1:00 PM Medical Record Number: [id number] Patient Account Number: [id number] Date of Birth/Sex: [birth year] (85 y.o. F) Treating RN: Rodell Perna Primary Care Zackaria Burkey: Aram Beecham Other Clinician: Referring Kelani Robart: Aram Beecham Treating Deserie Dirks/Extender: Linwood Dibbles, HOYT Weeks in Treatment: 7 Active Inactive Abuse / Safety / Falls / Self Care Management Nursing Diagnoses: Potential for falls Goals: Patient will remain injury free related to falls Date Initiated: 06/28/2019 Target Resolution Date: 09/21/2019 Goal Status: Active Interventions: Assess  fall risk on admission and as needed Notes: Wound/Skin Impairment Nursing Diagnoses: Impaired tissue integrity Goals: Ulcer/skin breakdown will heal within 14 weeks Date Initiated: 06/28/2019 Target Resolution Date: 09/21/2019 Goal Status: Active Interventions: Assess patient/caregiver ability to obtain necessary supplies Assess patient/caregiver ability to perform ulcer/skin care regimen upon admission and as needed Assess ulceration(s) every visit Notes: Electronic Signature(s) Signed: 08/22/2019 1:49:14 PM By: Rodell Perna Entered By: Rodell Perna on 08/22/2019 13:24:49 [name] ([id number]) -------------------------------------------------------------------------------- Pain Assessment Details Patient Name: [name] Date of Service: 08/22/2019 1:00 PM Medical Record Number: [id number] Patient Account Number: [id number] Date of Birth/Sex: June 29, 1930 (85 y.o. F) Treating RN: Rodell Perna Primary Care Deana Krock: Aram Beecham Other Clinician: Referring Marjie Chea: Aram Beecham Treating Eswin Worrell/Extender: Linwood Dibbles, HOYT Weeks in Treatment: 7 Active Problems Location of Pain Severity and Description of Pain Patient Has Paino No Site Locations Pain Management and Medication Current Pain Management: Electronic Signature(s) Signed: 08/22/2019 1:49:14 PM By: Rodell Perna Signed: 08/22/2019 5:06:05 PM By: Dayton Martes RCP, RRT, CHT Entered By: Dayton Martes on 08/22/2019 12:59:08 [name] ([id number]) -------------------------------------------------------------------------------- Patient/Caregiver Education Details Patient Name: [name] Date of Service: 08/22/2019 1:00 PM Medical Record Number: [id number] Patient Account Number: [id number] Date of Birth/Gender: 09-12-1930 (85 y.o. F) Treating RN: Rodell Perna Primary Care Physician: Aram Beecham Other Clinician: Referring Physician: Aram Beecham Treating  Physician/Extender: Skeet Simmer in Treatment: 7 Education Assessment Education Provided To: Patient Education Topics Provided Wound/Skin Impairment: Handouts: Caring for Your Ulcer Methods: Demonstration, Explain/Verbal Responses: State content correctly Electronic Signature(s) Signed: 08/22/2019 1:49:14 PM By: Rodell Perna Entered By: Rodell Perna on 08/22/2019 13:31:52 [name] ([id number]) -------------------------------------------------------------------------------- Wound Assessment Details Patient Name: [name] Date of Service: 08/22/2019 1:00 PM Medical Record Number: [id number] Patient Account Number: [id number] Date of Birth/Sex: December 06, 1930 (85 y.o. F) Treating RN: Curtis Sites Primary Care Geisha Abernathy: Aram Beecham Other Clinician: Referring Tanji Storrs: Aram Beecham Treating Urania Pearlman/Extender: Linwood Dibbles, HOYT Weeks in Treatment: 7 Wound Status Wound Number: 11 Primary Skin Tear Etiology: Wound Location: Right Lower Leg - Anterior Wound Open Wounding Event: Trauma Status: Date Acquired: 06/27/2019 Comorbid Cataracts, Glaucoma, Optic Neuritis, Chronic Weeks Of Treatment: 7 History: sinus problems/congestion, Middle ear problems Clustered Wound: No Photos Wound Measurements Length: (cm) 2.8 % Reduction in Width: (cm) 3.2 % Reduction in Depth: (cm) 0.2 Epithelializat Area: (cm) 7.037 Tunneling: Volume: (cm) 1.407 Undermining: Area: 34.1% Volume: 73.7% ion: None No No Wound Description Classification: Partial Thickness Foul Odor Aft  Wound Margin: Flat and Intact Slough/Fibrin Exudate Amount: Medium Exudate Type: Serous Exudate Color: amber er Cleansing: No o Yes Wound Bed Granulation Amount: Medium (34-66%) Exposed Structure Granulation Quality: Red Fascia Exposed: No Necrotic Amount: Medium (34-66%) Fat Layer (Subcutaneous Tissue) Exposed: Yes Necrotic Quality: Adherent Slough Tendon Exposed: No Muscle Exposed:  No Joint Exposed: No Bone Exposed: No Treatment Notes MELENIE, MINNIEAR ([id number]) Wound #11 (Right, Anterior Lower Leg) Notes Hblue, ABD, conform tubi grip F Electronic Signature(s) Signed: 08/22/2019 4:59:38 PM By: Montey Hora Entered By: Montey Hora on 08/22/2019 13:11:00 [name] ([id number]) -------------------------------------------------------------------------------- Vitals Details Patient Name: [name] Date of Service: 08/22/2019 1:00 PM Medical Record Number: [id number] Patient Account Number: [id number] Date of Birth/Sex: Jul 11, 1930 (85 y.o. F) Treating RN: Army Melia Primary Care Stephfon Bovey: Fulton Reek Other Clinician: Referring Rufus Beske: Fulton Reek Treating Waris Rodger/Extender: Melburn Hake, HOYT Weeks in Treatment: 7 Vital Signs Time Taken: 12:58 Temperature (F): 98.3 Height (in): 60 Pulse (bpm): 72 Weight (lbs): 125 Respiratory Rate (breaths/min): 16 Body Mass Index (BMI): 24.4 Blood Pressure (mmHg): 139/54 Reference Range: 80 - 120 mg / dl Electronic Signature(s) Signed: 08/22/2019 5:06:05 PM By: Lorine Bears RCP, RRT, CHT Entered By: Lorine Bears on 08/22/2019 12:59:39

## 2019-08-22 NOTE — Progress Notes (Addendum)
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 at this time. That was the big concern currently as far as the patient was concerned. Nonetheless I do feel like she is making progress in regard to the feeling of this ulcer it has been slow. She did see a Engineer, petroleum they are talking about putting her in a brace in order to allow this area to heal more  appropriately. 12/19/2018; not much change in this from the last time I have saw this.'s much smaller area than when she first came in and with less circumferential undermining however this is never really adhered. She is wearing the brace that was given or prescribed to her by plastics. She did not have a procedure offered to attempt to close this. We have been using endoform 1/15; wound actually is not doing as well as last week. She was actually not supposed to come into this clinic again until next week but apparently her attendant noticed some redness increasing pain and she came in early. She reports the same amount of drainage. We have been using endoform. She is approved through puraply however I will only consider starting that next week 1/22; she completed the antibiotics last week. Culture I did was negative. In spite of this there is less erythema and pain complaints in the wound. Puraply #1 applied today 1/29; Puraply #2 today. Wound surface looks a lot better post debridement of adherent fibrinous material. However undermining from 6-12 is measuring worse 2/5; Puraply #3. Using her elbow brace 2/12 puraply #4 2/19 puraply #5. The 9:00 undermining measured at 0.5 cm. Undermining from 4-11 o'clock. Surface of the wound looks better and the circumference of the wound is smaller however the undermining is not really changed 2/26; still not much improvement. She has undermining from 4-9 o'clock 0.9 cm. Surface of the wound covered and adherent debris. 3/4; still no improvement. Undermining from 4-9 o'clock still around a centimeter. Surface of the wound looks somewhat better. No debridement is required we used endoform after we ended the trial of puraply last week 3/11; really no improvement at all. Still 1 cm undermining from roughly 9-3 o'clock. This is about a centimeter. The base of the wound looks fairly healthy. No debridement. We have been using endoform. I am really out of most  usual options here. I could consider either another round of an amniotic advanced treatment product example epifix or perhaps regranex. Understandably the patient is a bit frustrated. We did send her to plastic surgery for a consult. Other than prescribing her a brace to immobilize the elbow they did not think she was a candidate for any further surgery. Notable that the patient is not using the brace today 3/18-Patient returns for attention to the left elbow area which apparently looked red at the home health visit.  Patient's elbow looks the same if not better compared with last visit. The area of ulceration remains the same, the base appears healthy. We are continuing to use endoform she has been encouraged to use the brace to keep the elbow straight 3/25; the patient has an appointment at the [facility] wound care center on 4/2. I had actually put her out indefinitely however she seems to want to come back here every week. This week she complains of increased pain and malodor. Dimensions of the elbow wound are larger. We have been using endoform 4/8; the patient went to [facility] where they apparently gave her meta honey and some border foam with a Tubigrip. She has been using this for a week. She says she was very impressed with them there. They did not offer her any surgical consultation. She seems to be coming back here for a second opinion on this, she does not wish to drive to [facility] every week 7/65; still using Medihoney foam border and a Tubigrip. Actually do not think she has anything on the arm at all specifically she is not using her brace 5/6; she has been using medihoney without a lot of improvement. Undermining maximum at 9:00 at 0.5 cm may be somewhat better. She is still complaining of discomfort. She uses her elbow brace at night but is not using anything on the arm during the day, she finds it too restrictive 5/13; we switch the patient to endoform AG last week. She is complaining of more  pain and worried about some circumferential erythema around the wound. I had planned to consider epifix in this wound however the patient came in with a request to see a plastic surgeon in [place] by the name of Dr. Wyline Mood who cared for a friend of hers. Noteworthy that I have already sent her to one plastic surgeon and she went for another second opinion at [facility] and they apparently did not send her to a plastic surgeon nevertheless the patient is fairly convinced that she might benefit from a skin graft which I am doubtful. She also has rheumatoid arthritis and is on methotrexate. This was originally a surgical wound for a bursectomy a year or 2 ago Readmission: 06/28/19 on evaluation today patient presents today for reevaluation but this is due to a new issue her elbow has completely healed and looks excellent. Her right anterior lower leg has a skin tear which was sustained from her dog who jumped up on her and inadvertently scratch the area causing the skin tear. This happened yesterday. She is having some discomfort but fortunately nothing too significant which is good news. No fevers, chills, nausea, or vomiting noted at this time. The skin fortunately was knocked one completely off and we are gonna see about we approximate in the skin as best we can in using Steri-Strips to hold this in place obviously if we can get some of this to reattach that would be beneficial SOMALY, JOCHEM. ([id number]) 07/05/2019 on evaluation today patient actually appears to be doing okay although unfortunately the skin flap that we were attempting to Steri-Stripped down last week did not take. She is developing a lot of fluid underneath the wound area unfortunately which again is not ideal. I think that this necrotic tissue needs to be removed and again it actually just wiped off during the evaluation today as I was attempting to clean the wound there did not appear to be any significant issues underlying  which is good although there was some  purulent drainage I did want to go ahead and see about obtaining a culture from today in order to ensure that the Z-Pak that I placed her on earlier in the week was appropriate for treating what ever infection may be causing the issue currently. This was a deep wound culture obtained today. 07/12/2019 on evaluation today patient actually appears to be doing quite well with regard to her right lower extremity ulcer all things considering. There is still little bit of hematoma not it that is noted in the central portion of the wound along with some necrotic tissue but she is still having quite a bit of discomfort. For that reason I did not perform sharp debridement today although this wound does need some debridement of one type or another. I think we may attempt Iodoflex to see if this can be of benefit. Fortunately there is no signs of active infection at this point. 07/18/19 upon evaluation today patient appears to be doing better with regard to her ulcer on her right lower extremity. She's been tolerating the dressing changes without complication. The good news is she seems to be making excellent progress. Overall I'm pleased with there being no signs of infection. With that being said she does tell me that she's been having some discomfort with the wrap she's unsure of exactly what about the rafters causing her trouble. 07/25/2019 on evaluation today patient appears to be doing a little better in regard to her lower extremity ulcer. Unfortunately the alginate seems to be getting really stuck to the wound bed and surrounding periwound. Fortunately there is no signs of active infection at this time. There does not appear to be any evidence of infection currently. 08/01/2019 on evaluation today patient actually appears to be doing much better with regard to her right lower extremity ulcer. She has been tolerating the dressing changes without complication. Fortunately  her wound does not show any signs of infection and seems to be making good progress. No fevers, chills, nausea, vomiting, or diarrhea. 08/08/2019 on evaluation today patient actually appears to be doing better with regard to her leg ulcer. She has been tolerating the dressing changes without complication. With that being said I think we may want to switch the dressing up today just based on the appearance of the wound bed in general. Fortunately there is no evidence of infection currently. Her pain seems to be much better. Her son is present with her today. 08/15/2019 on evaluation today patient appears to be doing very well with regard to her right lower extremity ulcer. She still complains about feeling like the compression stocking/Tubigrip is too tight for her. Nonetheless I still think this is beneficial and is helping the wound to heal more effectively. Overall I am extremely pleased at this time with what I am seeing. 08/22/2019 on evaluation today patient actually appears to be doing quite well with regard to her right lower extremity. She has been tolerating the dressing changes without complication. Fortunately there is no signs of active infection at this time. She still complains about the Tubigrip but nonetheless I think this is something that is of utmost importance for her to continue to wear if she is going to see this area heal appropriately. Patient History Information obtained from Patient. Family History Heart Disease - Siblings,Father, No family history of Cancer, Diabetes, Hereditary Spherocytosis, Hypertension, Kidney Disease, Lung Disease, Seizures, Stroke, Thyroid Problems, Tuberculosis. Social History Never smoker, Alcohol Use - Daily, Drug Use - No History, Caffeine Use -  Never. Medical History Eyes Patient has history of Cataracts, Glaucoma, Optic Neuritis Ear/Nose/Mouth/Throat Patient has history of Chronic sinus problems/congestion, Middle ear problems KIAN, GAMARRA ([id number]) Medical And Surgical History Notes Constitutional Symptoms (General Health) Back pain Ear/Nose/Mouth/Throat bilateral hearing aides Review of Systems (ROS) Constitutional Symptoms ([place]) Denies complaints or symptoms of Fatigue, Fever, Chills, Marked Weight Change. Respiratory Denies complaints or symptoms of Chronic or frequent coughs, Shortness of Breath. Cardiovascular Denies complaints or symptoms of Chest pain, LE edema. Psychiatric Denies complaints or symptoms of Anxiety, Claustrophobia. Objective Constitutional Well-nourished and well-hydrated in no acute distress. Vitals Time Taken: 12:58 PM, Height: 60 in, Weight: 125 lbs, BMI: 24.4, Temperature: 98.3 F, Pulse: 72 bpm, Respiratory Rate: 16 breaths/min, Blood Pressure: 139/54 mmHg. Respiratory normal breathing without difficulty. Psychiatric this patient is able to make decisions and demonstrates good insight into disease process. Alert and Oriented x 3. pleasant and cooperative. General Notes: Patient's wound bed currently did require some sharp debridement to remove necrotic material/slough from the surface of the wound she tolerated this today without complication post debridement wound bed appears to be doing much better. Integumentary (Hair, Skin) Wound #11 status is Open. Original cause of wound was Trauma. The wound is located on the Right,Anterior Lower Leg. The wound measures 2.8cm length x 3.2cm width x 0.2cm depth; 7.037cm^2 area and 1.407cm^3 volume. There is Fat Layer (Subcutaneous Tissue) Exposed exposed. There is no tunneling or undermining noted. There is a medium amount of serous drainage noted. The wound margin is flat and intact. There is medium (34-66%) red granulation within the wound bed. There is a medium (34-66%) amount of necrotic tissue within the wound bed including Adherent Slough. Assessment KORRINA, ZERN ([id number]) Active Problems ICD-10 Unspecified  open wound, right lower leg, initial encounter Non-pressure chronic ulcer of other part of right lower leg with fat layer exposed Struck by dog, initial encounter Procedures Wound #11 Pre-procedure diagnosis of Wound #11 is a Skin Tear located on the Right,Anterior Lower Leg . There was a Excisional Skin/Subcutaneous Tissue Debridement with a total area of 8.96 sq cm performed by STONE III, HOYT E., PA-C. With the following instrument(s): Curette to remove Viable and Non-Viable tissue/material. Material removed includes Subcutaneous Tissue and Slough and after achieving pain control using Lidocaine. A time out was conducted at 13:27, prior to the start of the procedure. A Minimum amount of bleeding was controlled with Pressure. The procedure was tolerated well. Post Debridement Measurements: 2.8cm length x 3.2cm width x 0.2cm depth; 1.407cm^3 volume. Character of Wound/Ulcer Post Debridement is stable. Post procedure Diagnosis Wound #11: Same as Pre-Procedure Plan Wound Cleansing: Wound #11 Right,Anterior Lower Leg: Clean wound with Normal Saline. May shower with protection. - Please do not get your wrap wet Primary Wound Dressing: Wound #11 Right,Anterior Lower Leg: Hydrafera Blue Ready Transfer Secondary Dressing: Wound #11 Right,Anterior Lower Leg: ABD pad Conform/Kerlix Dressing Change Frequency: Wound #11 Right,Anterior Lower Leg: Change dressing every other day. Follow-up Appointments: Wound #11 Right,Anterior Lower Leg: Return Appointment in 1 week. Edema Control: Wound #11 Right,Anterior Lower Leg: Other: - tubi grip F 1. I would recommend currently that we go ahead and continue with the [facility] dressing as this seems to be beneficial for the patient. 2. I am in a suggest as well that we also continue to utilize the Tubigrip as this seems to be helping with her edema quite well. BRAELEY, BUSKEY ([id number]) 3. I am also going to suggest at this time that the  patient continue to elevate her legs as much as possible obviously I think this plays a big role in helping the area to heal. We will see patient back for reevaluation in 1 week here in the clinic. If anything worsens or changes patient will contact our office for additional recommendations. Electronic Signature(s) Signed: 08/23/2019 5:42:12 PM By: Lenda Kelp PA-C Entered By: Lenda Kelp on 08/23/2019 17:42:12 [name] ([id number]) -------------------------------------------------------------------------------- ROS/PFSH Details Patient Name: [name] Date of Service: 08/22/2019 1:00 PM Medical Record Number: [id number] Patient Account Number: [id number] Date of Birth/Sex: Jan 01, 1930 (85 y.o. F) Treating RN: Rodell Perna Primary Care Provider: Aram Beecham Other Clinician: Referring Provider: Aram Beecham Treating Provider/Extender: Linwood Dibbles, HOYT Weeks in Treatment: 7 Information Obtained From Patient Constitutional Symptoms (General Health) Complaints and Symptoms: Negative for: Fatigue; Fever; Chills; Marked Weight Change Medical History: Past Medical History Notes: Back pain Respiratory Complaints and Symptoms: Negative for: Chronic or frequent coughs; Shortness of Breath Cardiovascular Complaints and Symptoms: Negative for: Chest pain; LE edema Psychiatric Complaints and Symptoms: Negative for: Anxiety; Claustrophobia Eyes Medical History: Positive for: Cataracts; Glaucoma; Optic Neuritis Ear/Nose/Mouth/Throat Medical History: Positive for: Chronic sinus problems/congestion; Middle ear problems Past Medical History Notes: bilateral hearing aides HBO Extended History Items Ear/Nose/Mouth/Throat: Eyes: Eyes: Ear/Nose/Mouth/Throat: Chronic sinus Cataracts Glaucoma Middle ear problems problems/congestion Immunizations Pneumococcal Vaccine: Received Pneumococcal Vaccination: No Implantable Devices SALINA, STANFIELD  ([id number]) Yes Family and Social History Cancer: No; Diabetes: No; Heart Disease: Yes - Siblings,Father; Hereditary Spherocytosis: No; Hypertension: No; Kidney Disease: No; Lung Disease: No; Seizures: No; Stroke: No; Thyroid Problems: No; Tuberculosis: No; Never smoker; Alcohol Use: Daily; Drug Use: No History; Caffeine Use: Never; Financial Concerns: No; Food, Clothing or Shelter Needs: No; Support System Lacking: No; Transportation Concerns: No Physician Affirmation I have reviewed and agree with the above information. Electronic Signature(s) Signed: 08/23/2019 6:01:04 PM By: Lenda Kelp PA-C Signed: 08/26/2019 11:57:01 AM By: Rodell Perna Entered By: Lenda Kelp on 08/23/2019 17:40:36 [name] ([id number]) -------------------------------------------------------------------------------- SuperBill Details Patient Name: [name] Date of Service: 08/22/2019 Medical Record Number: [id number] Patient Account Number: [id number] Date of Birth/Sex: [birth year] (85 y.o. F) Treating RN: Rodell Perna Primary Care Provider: Aram Beecham Other Clinician: Referring Provider: Aram Beecham Treating Provider/Extender: Linwood Dibbles, HOYT Weeks in Treatment: 7 Diagnosis Coding ICD-10 Codes Code Description S81.801A Unspecified open wound, right lower leg, initial encounter L97.812 Non-pressure chronic ulcer of other part of right lower leg with fat layer exposed W54.1XXA Struck by dog, initial encounter Facility Procedures CPT4 Code Description: [id number] [phone] - DEB SUBQ TISSUE 20 SQ CM/< ICD-10 Diagnosis Description L97.812 Non-pressure chronic ulcer of other part of right lower leg wit S81.801A Unspecified open wound, right lower leg, initial encounter Modifier: h fat layer expo Quantity: 1 sed Physician Procedures CPT4 Code Description: [id number] - WC PHYS SUBQ TISS 20 SQ CM ICD-10 Diagnosis Description L97.812 Non-pressure chronic ulcer of other part of right lower leg  wit S81.801A Unspecified open wound, right lower leg, initial encounter Modifier: h fat layer expo Quantity: 1 sed Electronic Signature(s) Signed: 08/23/2019 5:42:29 PM By: Lenda Kelp PA-C Entered By: Lenda Kelp on 08/23/2019 17:42:28

## 2019-08-27 ENCOUNTER — Ambulatory Visit: Payer: Medicare Other

## 2019-08-29 ENCOUNTER — Ambulatory Visit: Payer: Medicare Other

## 2019-08-29 ENCOUNTER — Encounter: Payer: Medicare Other | Admitting: Physician Assistant

## 2019-08-29 ENCOUNTER — Other Ambulatory Visit: Payer: Self-pay

## 2019-08-29 DIAGNOSIS — R262 Difficulty in walking, not elsewhere classified: Secondary | ICD-10-CM

## 2019-08-29 DIAGNOSIS — S81801A Unspecified open wound, right lower leg, initial encounter: Secondary | ICD-10-CM | POA: Diagnosis not present

## 2019-08-29 DIAGNOSIS — Z9181 History of falling: Secondary | ICD-10-CM

## 2019-08-29 DIAGNOSIS — R2681 Unsteadiness on feet: Secondary | ICD-10-CM

## 2019-08-29 DIAGNOSIS — M6281 Muscle weakness (generalized): Secondary | ICD-10-CM | POA: Diagnosis not present

## 2019-08-29 NOTE — Progress Notes (Signed)
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 partner. He recommended putting her in a elbow brace and I believe is written a prescription for it. He also recommended Motrin 800 mg 3 times daily. This is prescription strength ibuprofen although he did not write his prescription. This apparently was for 2 weeks. Culture I did last time grew a few methicillin sensitive staph aureus. After some difficulty due to  drug intolerances/allergies and drug interactions I settled on a 5-day course of azithromycin LYNNETTA, TOM ([id number]) 12/03/18 on evaluation today patient actually appears to be doing fairly well in regard to her elbow when compared to last time I evaluated her. With that being said there does not appear to be any signs of infection at this time. That was the big concern currently as far as the patient was concerned. Nonetheless I do feel like she is making progress in regard to the feeling of this ulcer it has been slow. She did see a Engineer, petroleum they are talking about putting her in a brace in order to allow this area to heal more appropriately. 12/19/2018; not much change in this from the last time I have saw this.'s much smaller area than when she first came in and with less circumferential undermining however this is never really adhered. She is wearing the brace that was given or prescribed to her by plastics. She did not have a procedure offered to attempt to close this. We have been using endoform 1/15; wound actually is not doing as well as last week. She was actually not supposed to come into this clinic again until next week but apparently her attendant noticed some redness increasing pain and she came in early. She reports the same amount of drainage. We have been using endoform. She is approved through puraply however I will only consider starting that next week 1/22; she completed the antibiotics last week. Culture I did was negative. In spite of this there is less erythema and pain complaints in the wound. Puraply #1 applied today 1/29; Puraply #2 today. Wound surface looks a lot better post debridement of adherent fibrinous material. However undermining from 6-12 is measuring worse 2/5; Puraply #3. Using her elbow brace 2/12 puraply #4 2/19 puraply #5. The 9:00 undermining measured at 0.5 cm. Undermining from 4-11 o'clock. Surface of the wound looks better and the  circumference of the wound is smaller however the undermining is not really changed 2/26; still not much improvement. She has undermining from 4-9 o'clock 0.9 cm. Surface of the wound covered and adherent debris. 3/4; still no improvement. Undermining from 4-9 o'clock still around a centimeter. Surface of the wound looks somewhat better. No debridement is required we used endoform after we ended the trial of puraply last week 3/11; really no improvement  at all. Still 1 cm undermining from roughly 9-3 o'clock. This is about a centimeter. The base of the wound looks fairly healthy. No debridement. We have been using endoform. I am really out of most usual options here. I could consider either another round of an amniotic advanced treatment product example epifix or perhaps regranex. Understandably the patient is a bit frustrated. We did send her to plastic surgery for a consult. Other than prescribing her a brace to immobilize the elbow they did not think she was a candidate for any further surgery. Notable that the patient is not using the brace today 3/18-Patient returns for attention to the left elbow area which apparently looked red at the home health visit. Patient's elbow looks the same if not better compared with last visit. The area of ulceration remains the same, the base appears healthy. We are continuing to use endoform she has been encouraged to use the brace to keep the elbow straight 3/25; the [facility]. I had actually put her out indefinitely however she seems to want to come back here every week. This week she complains of increased pain and malodor. Dimensions of the elbow wound are larger. We have been using endoform 4/8; [facility] gave her meta honey and some border foam with a Tubigrip. She has been using this for a week. She says she was very impressed with them there. They did not offer  her any surgical consultation. She seems to be coming back here for a second opinion on this, she does not wish to drive to [facility] every week 1/614/22; still using Medihoney foam border and a Tubigrip. Actually do not think she has anything on the arm at all specifically she is not using her brace 5/6; she has been using medihoney without a lot of improvement. Undermining maximum at 9:00 at 0.5 cm may be somewhat better. She is still complaining of discomfort. She uses her elbow brace at night but is not using anything on the arm during the day, she finds it too restrictive 5/13; we switch the patient to endoform AG last week. She is complaining of more pain and worried about some circumferential erythema around the wound. I had planned to consider epifix in this wound however the patient came in with a request to see a plastic [place] in [place] by the name of Dr. Wyline MoodBranch who cared for a friend of hers. Noteworthy that I have already sent her to one plastic surgeon and she went [facility] and they apparently did not send her to a plastic surgeon nevertheless the patient is fairly convinced that she might benefit from a skin graft which I am doubtful. She also has rheumatoid arthritis and is on methotrexate. This was originally a surgical wound for a bursectomy a year or 2 ago Readmission: 06/28/19 on evaluation today patient presents today for reevaluation but this is due to a new issue her elbow has completely healed and looks excellent. Her right anterior lower leg has a skin tear which was sustained from her dog who jumped up on her and inadvertently scratch the area causing the skin tear. This happened yesterday. She is having some discomfort but fortunately nothing too significant which is good news. No fevers, chills, nausea, or vomiting noted at this time. The skin fortunately was knocked one completely off and we are gonna see about we approximate in the  skin as best  we can in using Steri-Strips to hold this in place obviously if we can get some of this to reattach that would be beneficial JACLENE, BARTELT ([id number]) 07/05/2019 on evaluation today patient actually appears to be doing okay although unfortunately the skin flap that we were attempting to Steri-Stripped down last week did not take. She is developing a lot of fluid underneath the wound area unfortunately which again is not ideal. I think that this necrotic tissue needs to be removed and again it actually just wiped off during the evaluation today as I was attempting to clean the wound there did not appear to be any significant issues underlying which is good although there was some purulent drainage I did want to go ahead and see about obtaining a culture from today in order to ensure that the Z-Pak that I placed her on earlier in the week was appropriate for treating what ever infection may be causing the issue currently. This was a deep wound culture obtained today. 07/12/2019 on evaluation today patient actually appears to be doing quite well with regard to her right lower extremity ulcer all things considering. There is still little bit of hematoma not it that is noted in the central portion of the wound along with some necrotic tissue but she is still having quite a bit of discomfort. For that reason I did not perform sharp debridement today although this wound does need some debridement of one type or another. I think we may attempt Iodoflex to see if this can be of benefit. Fortunately there is no signs of active infection at this point. 07/18/19 upon evaluation today patient appears to be doing better with regard to her ulcer on her right lower extremity. She's been tolerating the dressing changes without complication. The good news is she seems to be making excellent progress. Overall I'm pleased with there being no signs of infection. With that being said she does tell me that she's been  having some discomfort with the wrap she's unsure of exactly what about the rafters causing her trouble. 07/25/2019 on evaluation today patient appears to be doing a little better in regard to her lower extremity ulcer. Unfortunately the alginate seems to be getting really stuck to the wound bed and surrounding periwound. Fortunately there is no signs of active infection at this time. There does not appear to be any evidence of infection currently. 08/01/2019 on evaluation today patient actually appears to be doing much better with regard to her right lower extremity ulcer. She has been tolerating the dressing changes without complication. Fortunately her wound does not show any signs of infection and seems to be making good progress. No fevers, chills, nausea, vomiting, or diarrhea. 08/08/2019 on evaluation today patient actually appears to be doing better with regard to her leg ulcer. She has been tolerating the dressing changes without complication. With that being said I think we may want to switch the dressing up today just based on the appearance of the wound bed in general. Fortunately there is no evidence of infection currently. Her pain seems to be much better. Her son is present with her today. 08/15/2019 on evaluation today patient appears to be doing very well with regard to her right lower extremity ulcer. She still complains about feeling like the compression stocking/Tubigrip is too tight for her. Nonetheless I still think this is beneficial and is helping the wound to heal more effectively. Overall I am extremely pleased at this time with  what I am seeing. 08/22/2019 on evaluation today patient actually appears to be doing quite well with regard to her right lower extremity. She has been tolerating the dressing changes without complication. Fortunately there is no signs of active infection at this time. She still complains about the Tubigrip but nonetheless I think this is something that  is of utmost importance for her to continue to wear if she is going to see this area heal appropriately. 08/29/2019 on evaluation today patient actually appears to be doing well visually in regard to her wound. Unfortunately in regard to overall pain she seems to be having more pain today which I am somewhat concerned about. There does not appear to be any signs of active infection that I can tell but again with increased pain that is definitely a concern here today. I do think it may be time to switch up the dressing as well to something that will be a little bit more effective hopefully and new tissue growth she has done well with collagen in the past. Patient History Information obtained from Patient. Family History Heart Disease - Siblings,Father, No family history of Cancer, Diabetes, Hereditary Spherocytosis, Hypertension, Kidney Disease, Lung Disease, Seizures, Stroke, Thyroid Problems, Tuberculosis. Social History Never smoker, Alcohol Use - Daily, Drug Use - No History, Caffeine Use - Never. CARLEA, BADOUR ([id number]) Medical History Eyes Patient has history of Cataracts, Glaucoma, Optic Neuritis Ear/Nose/Mouth/Throat Patient has history of Chronic sinus problems/congestion, Middle ear problems Medical And Surgical History Notes Constitutional Symptoms (General Health) Back pain Ear/Nose/Mouth/Throat bilateral hearing aides Review of Systems (ROS) Constitutional Symptoms (General Health) Denies complaints or symptoms of Fatigue, Fever, Chills, Marked Weight Change. Respiratory Denies complaints or symptoms of Chronic or frequent coughs, Shortness of Breath. Cardiovascular Denies complaints or symptoms of Chest pain, LE edema. Psychiatric Denies complaints or symptoms of Anxiety, Claustrophobia. Objective Constitutional Well-nourished and well-hydrated in no acute distress. Vitals Time Taken: 10:59 AM, Height: 60 in, Weight: 125 lbs, BMI: 24.4, Temperature: 98.2 F,  Pulse: 72 bpm, Respiratory Rate: 16 breaths/min, Blood Pressure: 135/42 mmHg. Respiratory normal breathing without difficulty. clear to auscultation bilaterally. Cardiovascular regular rate and rhythm with normal S1, S2. Psychiatric this patient is able to make decisions and demonstrates good insight into disease process. Alert and Oriented x 3. pleasant and cooperative. General Notes: Since wound bed did require some sharp debridement I was able to perform some debridement but really not everything was completely cleared away at this time. Fortunately I am pleased with the fact that she seems to be tolerating the dressing changes in general she did not have any trouble seemingly with the Tubigrip this time around she was happy to be able to get a shower which also think is beneficial. I do believe it is time to switch to Prisma. Integumentary (Hair, Skin) Wound #11 status is Open. Original cause of wound was Trauma. The wound is located on the Right,Anterior Lower Leg. The wound measures 3cm length x 4cm width x 0.4cm depth; 9.425cm^2 area and 3.77cm^3 volume. There is Fat Layer (Subcutaneous Tissue) Exposed exposed. There is no tunneling or undermining noted. There is a medium amount of serous drainage noted. The wound margin is flat and intact. There is medium (34-66%) red granulation within the wound bed. There MCKINNLEY, COTTIER ([id number]) is a medium (34-66%) amount of necrotic tissue within the wound bed including Adherent Slough. Assessment Active Problems ICD-10 Unspecified open wound, right lower leg, initial encounter Non-pressure chronic ulcer of other part of right  lower leg with fat layer exposed Struck by dog, initial encounter Procedures Wound #11 Pre-procedure diagnosis of Wound #11 is a Skin Tear located on the Right,Anterior Lower Leg . There was a Excisional Skin/Subcutaneous Tissue Debridement with a total area of 12 sq cm performed by STONE III, Ojani Berenson E., PA-C. With  the following instrument(s): Curette to remove Viable and Non-Viable tissue/material. Material removed includes Subcutaneous Tissue and Slough and after achieving pain control using Lidocaine. A time out was conducted at 11:10, prior to the start of the procedure. A Minimum amount of bleeding was controlled with Pressure. The procedure was tolerated well. Post Debridement Measurements: 3cm length x 4cm width x 0.1cm depth; 0.942cm^3 volume. Character of Wound/Ulcer Post Debridement is stable. Post procedure Diagnosis Wound #11: Same as Pre-Procedure Plan Wound Cleansing: Wound #11 Right,Anterior Lower Leg: Clean wound with Normal Saline. Primary Wound Dressing: Wound #11 Right,Anterior Lower Leg: Silver Collagen Secondary Dressing: Wound #11 Right,Anterior Lower Leg: Dry Gauze Conform/Kerlix Dressing Change Frequency: Wound #11 Right,Anterior Lower Leg: Change dressing every other day. Follow-up Appointments: Wound #11 Right,Anterior Lower Leg: Return Appointment in 1 week. Edema Control: Wound #11 Right,Anterior Lower Leg: Other: - tubi grip SAMHITHA, ROSEN V. ([id number]) 1. Patient's wound bed currently showed signs of good granulation although there was some slough I was able to clear this away we will get a switch to Prisma to see if that can be beneficial for her. 2. I am also get a recommend subsequently that we go ahead and obtain a wound culture which was obtained today to send to the lab for further evaluation to see if there is any evidence of infection. This is mainly due to the fact she has pain visually there does not appear to be any evidence of active infection. 3. We will get a continue with the Tubigrip as well as I feel like this is definitely beneficial for the patient from the standpoint of compression. We will see patient back for reevaluation in 1 week here in the clinic. If anything worsens or changes patient will contact our office for additional  recommendations. Electronic Signature(s) Signed: 08/29/2019 11:31:46 AM By: Lenda Kelp PA-C Previous Signature: 08/29/2019 11:30:26 AM Version By: Lenda Kelp PA-C Entered By: Lenda Kelp on 08/29/2019 11:31:46 [name] ([id number]) -------------------------------------------------------------------------------- ROS/PFSH Details Patient Name: [name] Date of Service: 08/29/2019 10:45 AM Medical Record Number: [id number] Patient Account Number: [id number] Date of Birth/Sex: [birth year] (85 y.o. F) Treating RN: Rodell Perna Primary Care Provider: Aram Beecham Other Clinician: Referring Provider: Aram Beecham Treating Provider/Extender: Linwood Dibbles, Shawna Kiener Weeks in Treatment: 8 Information Obtained From Patient Constitutional Symptoms (General Health) Complaints and Symptoms: Negative for: Fatigue; Fever; Chills; Marked Weight Change Medical History: Past Medical History Notes: Back pain Respiratory Complaints and Symptoms: Negative for: Chronic or frequent coughs; Shortness of Breath Cardiovascular Complaints and Symptoms: Negative for: Chest pain; LE edema Psychiatric Complaints and Symptoms: Negative for: Anxiety; Claustrophobia Eyes Medical History: Positive for: Cataracts; Glaucoma; Optic Neuritis Ear/Nose/Mouth/Throat Medical History: Positive for: Chronic sinus problems/congestion; Middle ear problems Past Medical History Notes: bilateral hearing aides HBO Extended History Items Ear/Nose/Mouth/Throat: Eyes: Eyes: Ear/Nose/Mouth/Throat: Chronic sinus Cataracts Glaucoma Middle ear problems problems/congestion Immunizations Pneumococcal Vaccine: Received Pneumococcal Vaccination: No Implantable Devices CRISSIE, ALOI ([id number]) Yes Family and Social History Cancer: No; Diabetes: No; Heart Disease: Yes - Siblings,Father; Hereditary Spherocytosis: No; Hypertension: No; Kidney Disease: No; Lung Disease: No; Seizures: No; Stroke: No;  Thyroid Problems: No; Tuberculosis: No; Never smoker; Alcohol  Use: Daily; Drug Use: No History; Caffeine Use: Never; Financial Concerns: No; Food, Clothing or Shelter Needs: No; Support System Lacking: No; Transportation Concerns: No Physician Affirmation I have reviewed and agree with the above information. Electronic Signature(s) Signed: 08/29/2019 11:30:00 AM By: Rodell Perna Signed: 08/29/2019 12:06:15 PM By: Lenda Kelp PA-C Entered By: Lenda Kelp on 08/29/2019 11:28:34 [name] ([id number]) -------------------------------------------------------------------------------- SuperBill Details Patient Name: [name] Date of Service: 08/29/2019 Medical Record Number: [id number] Patient Account Number: [id number] Date of Birth/Sex: Sep 05, 1930 (85 y.o. F) Treating RN: Rodell Perna Primary Care Provider: Aram Beecham Other Clinician: Referring Provider: Aram Beecham Treating Provider/Extender: Linwood Dibbles, Poseidon Pam Weeks in Treatment: 8 Diagnosis Coding ICD-10 Codes Code Description S81.801A Unspecified open wound, right lower leg, initial encounter L97.812 Non-pressure chronic ulcer of other part of right lower leg with fat layer exposed W54.1XXA Struck by dog, initial Scientist, physiological Code Description: [id number] [phone] - DEB SUBQ TISSUE 20 SQ CM/< ICD-10 Diagnosis Description L97.812 Non-pressure chronic ulcer of other part of right lower leg wit Modifier: h fat layer expo Quantity: 1 sed Physician Procedures CPT4 Code Description: 7846962 99214 - WC PHYS LEVEL 4 - EST PT ICD-10 Diagnosis Description S81.801A Unspecified open wound, right lower leg, initial encounter L97.812 Non-pressure chronic ulcer of other part of right lower leg wit W54.1XXA Struck by  dog, initial encounter Modifier: 25 h fat layer expo Quantity: 1 sed CPT4 Code Description: [id number] 11042 - WC PHYS SUBQ TISS 20 SQ CM ICD-10 Diagnosis Description L97.812 Non-pressure chronic  ulcer of other part of right lower leg wit Modifier: h fat layer expo Quantity: 1 sed Electronic Signature(s) Signed: 08/29/2019 11:32:05 AM By: Lenda Kelp PA-C Entered By: Lenda Kelp on 08/29/2019 11:32:04

## 2019-08-29 NOTE — Therapy (Signed)
[facility] [address] [place], [address] Phone: [phone]   Fax:  [phone]  Physical Therapy Treatment  Patient Details  Name: [name] MRN: [id number] Date of Birth: August 16, 1930 Referring Provider (PT): Cristopher Peru, MD   Encounter Date: 08/29/2019  PT End of Session - 08/29/19 1404    Visit Number  59    Number of Visits  110    Date for PT Re-Evaluation  10/10/19    Authorization Type  9    Authorization Time Period  10    PT Start Time  1404    PT Stop Time  1448    PT Time Calculation (min)  44 min    Activity Tolerance  Patient tolerated treatment well;Patient limited by fatigue    Behavior During Therapy  [facility] for tasks assessed/performed       Past Medical History:  Diagnosis Date  . Anemia   . GERD (gastroesophageal reflux disease)   . Hypertension   . Peripheral vascular disease (HCC)    possible neuropathies in lower extremeties  . PONV (postoperative nausea and vomiting)    happens sometimes but better with pre med of zofran  . Presence of permanent cardiac pacemaker   . Syncope     Past Surgical History:  Procedure Laterality Date  . ABDOMINAL HYSTERECTOMY  1969  . APPENDECTOMY  1969   with hysterectomy  . BACK SURGERY  2001   rods in back. surgery on back x 3  . COLONOSCOPY    . I&D EXTREMITY Left 03/27/2018   Procedure: IRRIGATION AND DEBRIDEMENT LEFT ELBOW / OLECRANON BURSA;  Surgeon: Christena Flake, MD;  Location: [facility] ORS;  Service: Orthopedics;  Laterality: Left;  . I&D EXTREMITY Left 06/05/2018   Procedure: IRRIGATION AND DEBRIDEMENT EXTREMITY;  Surgeon: Christena Flake, MD;  Location: [facility] ORS;  Service: Orthopedics;  Laterality: Left;  . INSERT / REPLACE / REMOVE PACEMAKER  2004  . JOINT REPLACEMENT Left 2004   partial knee replacement  . OLECRANON BURSECTOMY Left 03/27/2018   Procedure: LEFT OLECRANON BURSA;  Surgeon: Christena Flake, MD;  Location: [facility] ORS;   Service: Orthopedics;  Laterality: Left;  . PACEMAKER INSERTION  2014   dual chamber for complete heart block    There were no vitals filed for this visit.  Subjective Assessment - 08/29/19 1406    Subjective  Yesterday afternoon was on the go. Her wound doctor did another culture. York Spaniel that it looked better but is concerned that it is still sore and touchy. R leg is sore.    Pertinent History  Imparied gait. Pt states that she has had balance issues for too long. Feels more comfortable walking with her rw. Had tests which showed that she has neuropathy. However the doctor at [facility] said that her balance is coming from her back. Just wants to be strong so she can walk.  Started walking with her rw off and on for the past several months.  Also feels like she might need a new sleep number bed. Also had 2 wound surgeries at [name]. Pt fell in February 2019. Had to stop PT due to the L elbow surgery April 06, 2018 to remove a bursa. The wound still has not healed.   Currently sees a wound doctor.   Feels numbness L foot from the ankle down.  No R foot numbness.  Sometimes unsure of where her L LE is in  space.  Pt also states that she tires too quickly.  Has not been able to walk her dog in 2 years. Has not really been doing exercises at home except the sit <> stand. Pt states that prolonged sitting without back support will increase back pain     Currently in Pain?  No/denies                               PT Education - 08/29/19 1413    Education provided  Yes    Education Details  ther-ex    [employer]) Educated  Patient    Methods  Explanation;Demonstration;Tactile cues;Verbal cues    Comprehension  Verbalized understanding;Returned demonstration        OBJECTIVE  Pt ambulating with rw   No latex band allergies    MedbridgeAccess Code: [email or web address]   Pt has pacemaker  Wound with dressing R tibialis anterior muscle.  Therapeutic  exercise:  Gait with PT handheld assist 100 ft x2 and throughout the session  Forward step up onto 1st regular step with B UE assist 10x, then 5x each LE  Lateral step ups onto 4 inch step with 1 riser with B UE assist   R 10x  L 10x   seated trunk extension isometrics at neutral, manually resisted  Then 10x10secondsfor2sets  Side step with PT hand held assist 3 ft to the L  R leg discomfort from wound   Standing hip abduction with B UE assist  R 10x3  3 lbs L 10x3 3 lbs     SLS with light touch UE assist to promote glute med muscle strengthening and balance.  L 10x5 seconds R 10x5 seconds   Improved exercise technique, movement at target joints, use of target muscles after min to mod verbal, visual, tactile cues.       Patient response to treatment: Good muscle use felt with exercises.  Clinical Impression Continued working on gait training without use of rw but with PT hand held assist to promote balance. Also continued working on LE strengthening to promote balance and ability to perform standing tasks and decrease difficulty with gait. Pt tolerated session well without aggravation of symptoms. Pt will benefit from continued skilled physical therapy services to address the aforementioned deficits.     PT Short Term Goals - 04/25/19 1723      PT SHORT TERM GOAL #1   Title  Patient will be independent with her HEP to improve strength and balance.     Time  3    Period  Weeks    Status  On-going    Target Date  05/08/19        PT Long Term Goals - 08/13/19 1607      PT LONG TERM GOAL #1   Title  Patient will improve her 10 MWT to 0.8 m/s or more with rw to promote better community ambulation.     Baseline  0.5 seconds with rw (09/06/2018); 0.63 seconds average with rw (10/18/2018); 0.69 m/s average using rw (12/10/2018); 0.59 m/s using SPC and CGA (01/08/2019); 0.60 m/s  using SPC (01/21/2019); 0.56 m/s with SPC to no AD assist, CGA (02/04/2019); 0.45 m/s average (04/17/2019); 0.53 m/s with SPC (06/06/2019); 0.26 m/s average wiht SPC (08/13/2019)    Time  8    Period  Weeks    Status  On-going    Target Date  10/10/19      PT LONG  TERM GOAL #2   Title  Patient will improve her DGI score using rw or least restrictive AD to 19/24 or more to promote balance.     Baseline  13/24 using rw (09/06/2018); 14/24 using rw (10/29/2018); 16/24 (12/10/2018); 14/24 using SPC (01/21/2019); 9/24 using SPC (04/17/2019); 14/24 (06/11/2019; unable to complete test today secondary to pt request (08/13/2019)    Time  8    Period  Weeks    Status  On-going    Target Date  10/10/19      PT LONG TERM GOAL #3   Title  Patient will improve bilateral LE strength by at least 1/2 MMT to promote ability to support herself when performing standing tasks and improve balance.     Time  8    Period  Weeks    Status  On-going    Target Date  10/10/19      PT LONG TERM GOAL #4   Title  Patient will have a decrease in back pain/ache to 5/10 or less to promote ability to perform standing tasks with less difficulty.    Baseline  8/10 at most for the past 3 months (09/06/2018); 5-6/10 at worst for the past 7 days (10/18/2018); 5/10 at worst for the past 7 days (12/10/2018)    Time  6    Period  Weeks    Status  Achieved            Plan - 08/29/19 1413    Clinical Impression Statement  Continued working on gait training without use of rw but with PT hand held assist to promote balance. Also continued working on LE strengthening to promote balance and ability to perform standing tasks and decrease difficulty with gait. Pt tolerated session well without aggravation of symptoms. Pt will benefit from continued skilled physical therapy services to address the aforementioned deficits.    Personal Factors and Comorbidities  Age;Comorbidity 3+;Time since onset of injury/illness/exacerbation;Past/Current  Experience    Comorbidities  Pacemaker, L elbow and R tibialis anterior wounds with difficulty healing, back surgery    Examination-Activity Limitations  Carry;Locomotion Level;Stairs    Stability/Clinical Decision Making  Evolving/Moderate complexity    Rehab Potential  Fair    Clinical Impairments Affecting Rehab Potential  age, fear of falling, weakness    PT Frequency  2x / week    PT Duration  8 weeks    PT Treatment/Interventions  Aquatic Therapy;Gait training;Therapeutic activities;Therapeutic exercise;Balance training;Neuromuscular re-education;Patient/family education;Manual techniques;Dry needling    PT Next Visit Plan  trunk, hip, scapular strengthening, balance, gait, manual techniques PRN    Consulted and Agree with Plan of Care  Patient       Patient will benefit from skilled therapeutic intervention in order to improve the following deficits and impairments:  Abnormal gait, Decreased activity tolerance, Decreased balance, Decreased endurance, Decreased range of motion, Decreased strength, Difficulty walking, Improper body mechanics, Postural dysfunction, Pain, Decreased knowledge of use of DME  Visit Diagnosis: Muscle weakness (generalized)  Difficulty in walking, not elsewhere classified  History of falling  Unsteadiness on feet     Problem List Patient Active Problem List   Diagnosis Date Noted  . Septic olecranon bursitis of left elbow 03/27/2018  . Lymphedema 10/11/2017  . Chronic venous insufficiency 10/11/2017  . Leg pain 10/11/2017  . Leg swelling 10/11/2017  . COPD (chronic obstructive pulmonary disease) (HCC) 10/11/2017  . Essential hypertension 10/11/2017    Loralyn FreshwaterMiguel Sahasra Belue PT, DPT   08/29/2019, 3:11 PM  [facility] [address], [place], [address] Phone: [phone]   Fax:  [phone]  Name: [name] MRN: [id number] Date of Birth: 19-Dec-1929

## 2019-08-30 ENCOUNTER — Other Ambulatory Visit
Admission: RE | Admit: 2019-08-30 | Discharge: 2019-08-30 | Disposition: A | Discharge status: Home / Self Care | Payer: Self-pay | Source: Ambulatory Visit | Attending: Physician Assistant | Admitting: Physician Assistant

## 2019-08-30 DIAGNOSIS — L089 Local infection of the skin and subcutaneous tissue, unspecified: Secondary | ICD-10-CM | POA: Insufficient documentation

## 2019-08-30 NOTE — Progress Notes (Signed)
[name], [name] ([id number]) Visit Report for 08/29/2019 Arrival Information Details Patient Name: [name], [name] Date of Service: 08/29/2019 10:45 AM Medical Record Number: [id number] Patient Account Number: [id number] Date of Birth/Sex: 04-30-1930 (85 y.o. F) Treating RN: Huel Coventry Primary Care Tonita Bills: Aram Beecham Other Clinician: Referring Bravery Ketcham: Aram Beecham Treating Robyn Galati/Extender: Linwood Dibbles, HOYT Weeks in Treatment: 8 Visit Information History Since Last Visit Added or deleted any medications: No Patient Arrived: Dan Humphreys Any new allergies or adverse reactions: No Arrival Time: 10:58 Had a fall or experienced change in No Accompanied By: self activities of daily living that may affect Transfer Assistance: None risk of falls: Patient Identification Verified: Yes Signs or symptoms of abuse/neglect since last visito No Secondary Verification Process Completed: Yes Hospitalized since last visit: No Patient Requires Transmission-Based Precautions: No Implantable device outside of the clinic excluding No Patient Has Alerts: No cellular tissue based products placed in the center since last visit: Has Dressing in Place as Prescribed: Yes Pain Present Now: No Electronic Signature(s) Signed: 08/29/2019 4:49:58 PM By: Elliot Gurney, BSN, RN, CWS, Kim RN, BSN Entered By: Elliot Gurney, BSN, RN, CWS, Kim on 08/29/2019 10:59:10 [name] ([id number]) -------------------------------------------------------------------------------- Encounter Discharge Information Details Patient Name: [name] Date of Service: 08/29/2019 10:45 AM Medical Record Number: [id number] Patient Account Number: [id number] Date of Birth/Sex: 10-23-1930 (85 y.o. F) Treating RN: Rodell Perna Primary Care Kameshia Madruga: Aram Beecham Other Clinician: Referring Odessia Asleson: Aram Beecham Treating Hawthorne Day/Extender: Linwood Dibbles, HOYT Weeks in Treatment: 8 Encounter Discharge Information Items Post Procedure  Vitals Discharge Condition: Stable Temperature (F): 98.2 Ambulatory Status: Unstable Pulse (bpm): 78 Discharge Destination: Home Respiratory Rate (breaths/min): 16 Transportation: Private Auto Blood Pressure (mmHg): 135/42 Accompanied By: self Schedule Follow-up Appointment: Yes Clinical Summary of Care: Electronic Signature(s) Signed: 08/29/2019 11:30:00 AM By: Rodell Perna Entered By: Rodell Perna on 08/29/2019 11:19:52 [name] ([id number]) -------------------------------------------------------------------------------- Lower Extremity Assessment Details Patient Name: [name] Date of Service: 08/29/2019 10:45 AM Medical Record Number: [id number] Patient Account Number: [id number] Date of Birth/Sex: 12/14/1929 (85 y.o. F) Treating RN: Huel Coventry Primary Care Rylen Swindler: Aram Beecham Other Clinician: Referring Franceska Strahm: Aram Beecham Treating Lylie Blacklock/Extender: Linwood Dibbles, HOYT Weeks in Treatment: 8 Edema Assessment Assessed: [Left: No] [Right: No] [Left: Edema] [Right: :] Calf Left: Right: Point of Measurement: 30 cm From Medial Instep cm 33.5 cm Ankle Left: Right: Point of Measurement: 10 cm From Medial Instep cm 23 cm Vascular Assessment Pulses: Dorsalis Pedis Palpable: [Right:Yes] Posterior Tibial Palpable: [Right:Yes] Electronic Signature(s) Signed: 08/29/2019 4:49:58 PM By: Elliot Gurney, BSN, RN, CWS, Kim RN, BSN Entered By: Elliot Gurney, BSN, RN, CWS, Kim on 08/29/2019 11:05:34 [name] ([id number]) -------------------------------------------------------------------------------- Multi Wound Chart Details Patient Name: [name] Date of Service: 08/29/2019 10:45 AM Medical Record Number: [id number] Patient Account Number: [id number] Date of Birth/Sex: 1930-05-07 (85 y.o. F) Treating RN: Rodell Perna Primary Care Florabel Faulks: Aram Beecham Other Clinician: Referring Guage Efferson: Aram Beecham Treating Zuriel Yeaman/Extender: Linwood Dibbles, HOYT Weeks in  Treatment: 8 Vital Signs Height(in): 60 Pulse(bpm): 72 Weight(lbs): 125 Blood Pressure(mmHg): 135/42 Body Mass Index(BMI): 24 Temperature(F): 98.2 Respiratory Rate 16 (breaths/min): Photos: [N/A:N/A] Wound Location: Right Lower Leg - Anterior N/A N/A Wounding Event: Trauma N/A N/A Primary Etiology: Skin Tear N/A N/A Comorbid History: Cataracts, Glaucoma, Optic N/A N/A Neuritis, Chronic sinus problems/congestion, Middle ear problems Date Acquired: 06/27/2019 N/A N/A Weeks of Treatment: 8 N/A N/A Wound Status: Open N/A N/A Measurements L x W x D 3x4x0.4 N/A N/A (cm) Area (cm) : 9.425 N/A N/A Volume (cm) :  3.77 N/A N/A % Reduction in Area: 11.80% N/A N/A % Reduction in Volume: 29.40% N/A N/A Classification: Partial Thickness N/A N/A Exudate Amount: Medium N/A N/A Exudate Type: Serous N/A N/A Exudate Color: amber N/A N/A Wound Margin: Flat and Intact N/A N/A Granulation Amount: Medium (34-66%) N/A N/A Granulation Quality: Red N/A N/A Necrotic Amount: Medium (34-66%) N/A N/A Exposed Structures: Fat Layer (Subcutaneous N/A N/A Tissue) Exposed: Yes Fascia: No Tendon: No Muscle: No [name], [name] ([id number]) Joint: No Bone: No Epithelialization: None N/A N/A Treatment Notes Electronic Signature(s) Signed: 08/29/2019 11:30:00 AM By: Army Melia Entered By: Army Melia on 08/29/2019 11:09:04 [name] ([id number]) -------------------------------------------------------------------------------- [place] Details Patient Name: [name] Date of Service: 08/29/2019 10:45 AM Medical Record Number: [id number] Patient Account Number: [id number] Date of Birth/Sex: 1930/10/18 (85 y.o. F) Treating RN: Army Melia Primary Care Dinita Migliaccio: Fulton Reek Other Clinician: Referring Ugochukwu Chichester: Fulton Reek Treating Giulia Hickey/Extender: Melburn Hake, HOYT Weeks in Treatment: 8 Active Inactive Abuse / Safety / Falls / Self Care Management Nursing  Diagnoses: Potential for falls Goals: Patient will remain injury free related to falls Date Initiated: 06/28/2019 Target Resolution Date: 09/21/2019 Goal Status: Active Interventions: Assess fall risk on admission and as needed Notes: Wound/Skin Impairment Nursing Diagnoses: Impaired tissue integrity Goals: Ulcer/skin breakdown will heal within 14 weeks Date Initiated: 06/28/2019 Target Resolution Date: 09/21/2019 Goal Status: Active Interventions: Assess patient/caregiver ability to obtain necessary supplies Assess patient/caregiver ability to perform ulcer/skin care regimen upon admission and as needed Assess ulceration(s) every visit Notes: Electronic Signature(s) Signed: 08/29/2019 11:30:00 AM By: Army Melia Entered By: Army Melia on 08/29/2019 11:08:48 [name] ([id number]) -------------------------------------------------------------------------------- Pain Assessment Details Patient Name: [name] Date of Service: 08/29/2019 10:45 AM Medical Record Number: [id number] Patient Account Number: [id number] Date of Birth/Sex: 1930-10-13 (85 y.o. F) Treating RN: Cornell Barman Primary Care Imonie Tuch: Fulton Reek Other Clinician: Referring Jenniffer Vessels: Fulton Reek Treating Caius Silbernagel/Extender: Melburn Hake, HOYT Weeks in Treatment: 8 Active Problems Location of Pain Severity and Description of Pain Patient Has Paino No Site Locations Pain Management and Medication Current Pain Management: Notes Patient denies pain at this time. Electronic Signature(s) Signed: 08/29/2019 4:49:58 PM By: Gretta Cool, BSN, RN, CWS, Kim RN, BSN Entered By: Gretta Cool, BSN, RN, CWS, Kim on 08/29/2019 10:59:39 [name] ([id number]) -------------------------------------------------------------------------------- Patient/Caregiver Education Details Patient Name: [name] Date of Service: 08/29/2019 10:45 AM Medical Record Number: [id number] Patient Account Number: [id number] Date  of Birth/Gender: Apr 13, 1930 (85 y.o. F) Treating RN: Army Melia Primary Care Physician: Fulton Reek Other Clinician: Referring Physician: Fulton Reek Treating Physician/Extender: Sharalyn Ink in Treatment: 8 Education Assessment Education Provided To: Patient Education Topics Provided Wound/Skin Impairment: Handouts: Caring for Your Ulcer Methods: Demonstration, Explain/Verbal Responses: State content correctly Electronic Signature(s) Signed: 08/29/2019 11:30:00 AM By: Army Melia Entered By: Army Melia on 08/29/2019 11:18:54 [name] ([id number]) -------------------------------------------------------------------------------- Wound Assessment Details Patient Name: [name] Date of Service: 08/29/2019 10:45 AM Medical Record Number: [id number] Patient Account Number: [id number] Date of Birth/Sex: 1930-07-11 (85 y.o. F) Treating RN: Cornell Barman Primary Care Darlinda Bellows: Fulton Reek Other Clinician: Referring Anabia Weatherwax: Fulton Reek Treating Spyros Winch/Extender: Melburn Hake, HOYT Weeks in Treatment: 8 Wound Status Wound Number: 11 Primary Skin Tear Etiology: Wound Location: Right Lower Leg - Anterior Wound Open Wounding Event: Trauma Status: Date Acquired: 06/27/2019 Comorbid Cataracts, Glaucoma, Optic Neuritis, Chronic Weeks Of Treatment: 8 History: sinus problems/congestion, Middle ear problems Clustered Wound: No Photos Wound Measurements Length: (cm) 3 % Reduction i  Width: (cm) 4 % Reduction i Depth: (cm) 0.4 Epithelializa Area: (cm) 9.425 Tunneling: Volume: (cm) 3.77 Undermining: n Area: 11.8% n Volume: 29.4% tion: None No No Wound Description Classification: Partial Thickness Foul Odor Af Wound Margin: Flat and Intact Slough/Fibri Exudate Amount: Medium Exudate Type: Serous Exudate Color: amber ter Cleansing: No no Yes Wound Bed Granulation Amount: Medium (34-66%) Exposed Structure Granulation Quality: Red Fascia  Exposed: No Necrotic Amount: Medium (34-66%) Fat Layer (Subcutaneous Tissue) Exposed: Yes Necrotic Quality: Adherent Slough Tendon Exposed: No Muscle Exposed: No Joint Exposed: No Bone Exposed: No Treatment Notes RAYSHAWN, VISCONTI ([id number]) Wound #11 (Right, Anterior Lower Leg) Notes prisma,gauze, conform tubi grip F Electronic Signature(s) Signed: 08/29/2019 4:49:58 PM By: Elliot Gurney, BSN, RN, CWS, Kim RN, BSN Entered By: Elliot Gurney, BSN, RN, CWS, Kim on 08/29/2019 11:04:13 [name] ([id number]) -------------------------------------------------------------------------------- Vitals Details Patient Name: [name] Date of Service: 08/29/2019 10:45 AM Medical Record Number: [id number] Patient Account Number: [id number] Date of Birth/Sex: 02/06/1930 (85 y.o. F) Treating RN: Huel Coventry Primary Care Nakyra Bourn: Aram Beecham Other Clinician: Referring Ynez [name]: Aram Beecham Treating Jannatul Wojdyla/Extender: Linwood Dibbles, HOYT Weeks in Treatment: 8 Vital Signs Time Taken: 10:59 Temperature (F): 98.2 Height (in): 60 Pulse (bpm): 72 Weight (lbs): 125 Respiratory Rate (breaths/min): 16 Body Mass Index (BMI): 24.4 Blood Pressure (mmHg): 135/42 Reference Range: 80 - 120 mg / dl Electronic Signature(s) Signed: 08/29/2019 4:49:58 PM By: Elliot Gurney, BSN, RN, CWS, Kim RN, BSN Entered By: Elliot Gurney, BSN, RN, CWS, Kim on 08/29/2019 11:01:14

## 2019-09-01 LAB — AEROBIC CULTURE W GRAM STAIN (SUPERFICIAL SPECIMEN)
Culture: NO GROWTH
Gram Stain: NONE SEEN

## 2019-09-03 ENCOUNTER — Ambulatory Visit: Payer: Medicare Other

## 2019-09-03 ENCOUNTER — Other Ambulatory Visit: Payer: Self-pay

## 2019-09-03 DIAGNOSIS — M6281 Muscle weakness (generalized): Secondary | ICD-10-CM | POA: Diagnosis not present

## 2019-09-03 DIAGNOSIS — R2681 Unsteadiness on feet: Secondary | ICD-10-CM

## 2019-09-03 DIAGNOSIS — R262 Difficulty in walking, not elsewhere classified: Secondary | ICD-10-CM

## 2019-09-03 DIAGNOSIS — Z9181 History of falling: Secondary | ICD-10-CM

## 2019-09-03 NOTE — Therapy (Signed)
[facility] [address] [place], [address] Phone: [phone]   Fax:  [phone]  Physical Therapy Treatment (06/20/2019 - 09/03/2019)  Patient Details  Name: [name] MRN: [id number] Date of Birth: 14-Apr-1930 Referring Provider (PT): Cristopher Peru, MD   Encounter Date: 09/03/2019  PT End of Session - 09/03/19 1403    Visit Number  60    Number of Visits  110    Date for PT Re-Evaluation  10/10/19    Authorization Type  10    Authorization Time Period  10    PT Start Time  1404    PT Stop Time  1446    PT Time Calculation (min)  42 min    Activity Tolerance  Patient tolerated treatment well;Patient limited by fatigue    Behavior During Therapy  [facility] for tasks assessed/performed       Past Medical History:  Diagnosis Date  . Anemia   . GERD (gastroesophageal reflux disease)   . Hypertension   . Peripheral vascular disease (HCC)    possible neuropathies in lower extremeties  . PONV (postoperative nausea and vomiting)    happens sometimes but better with pre med of zofran  . Presence of permanent cardiac pacemaker   . Syncope     Past Surgical History:  Procedure Laterality Date  . ABDOMINAL HYSTERECTOMY  1969  . APPENDECTOMY  1969   with hysterectomy  . BACK SURGERY  2001   rods in back. surgery on back x 3  . COLONOSCOPY    . I&D EXTREMITY Left 03/27/2018   Procedure: IRRIGATION AND DEBRIDEMENT LEFT ELBOW / OLECRANON BURSA;  Surgeon: Christena Flake, MD;  Location: [facility] ORS;  Service: Orthopedics;  Laterality: Left;  . I&D EXTREMITY Left 06/05/2018   Procedure: IRRIGATION AND DEBRIDEMENT EXTREMITY;  Surgeon: Christena Flake, MD;  Location: [facility] OR[facility];  Service: Orthopedics;  Laterality: Left;  . INSERT / REPLACE / REMOVE PACEMAKER  2004  . JOINT REPLACEMENT Left 2004   partial knee replacement  . OLECRANON BURSECTOMY Left 03/27/2018   Procedure: LEFT OLECRANON BURSA;  Surgeon: Christena Flake, MD;   Location: [facility] OR[facility];  Service: Orthopedics;  Laterality: Left;  . PACEMAKER INSERTION  2014   dual chamber for complete heart block    There were no vitals filed for this visit.  Subjective Assessment - 09/03/19 1406    Subjective  Pt states that she changed the dressing for her R leg wound with her son and the medicine burns and makes it hard for her to walk.    Pertinent History  Imparied gait. Pt states that she has had balance issues for too long. Feels more comfortable walking with her rw. Had tests which showed that she has neuropathy. However the doctor at [facility] said that her balance is coming from her back. Just wants to be strong so she can walk.  Started walking with her rw off and on for the past several months.  Also feels like she might need a new sleep number bed. Also had 2 wound surgeries at [name]. Pt fell in February 2019. Had to stop PT due to the L elbow surgery April 06, 2018 to remove a bursa. The wound still has not healed.   Currently sees a wound doctor.   Feels numbness L foot from the ankle down.  No R foot numbness.  Sometimes unsure of where her L LE is in space.  Pt also states that she tires too quickly.  Has not been able to walk her dog in 2 years. Has not really been doing exercises at home except the sit <> stand. Pt states that prolonged sitting without back support will increase back pain     Currently in Pain?  No/denies   Except R leg wound.                              PT Education - 09/03/19 1428    Education provided  Yes    Education Details  ther-ex    [employer]) Educated  Patient    Methods  Explanation;Demonstration;Tactile cues;Verbal cues    Comprehension  Returned demonstration;Verbalized understanding      OBJECTIVE  Pt ambulating with rw   No latex band allergies    MedbridgeAccess Code: [email or web address]   Pt has pacemaker  Wound with dressing R tibialis anterior muscle.  Therapeutic  exercise:    seated trunk extension isometrics at neutral, manually resisted  Then 10x10secondsfor2sets  SLS with light touch UE assist to promote glute med muscle strengthening and balance.  L 10x5 seconds R 10x5 seconds   Improved exercise technique, movement at target joints, use of target muscles after min to mod verbal, visual, tactile cues.     Gait training  Gait with PT handheld assist 50 ft with SPC  10 ft with NBQC, pt did not like it secondary to it being more unsteady to use for her, then 90 ft with SPC, CGA. Difficulty performing secondary to fear of falling.    100 ft x 2 with SPC, improved ability to ambulate with SPC with practice. Cues for increased R LE step length    Worked on repetitions with gait with least restrictive AD to improve balance confidence.   Improved technique, movement at target joints, use of target muscles after mod verbal, visual, tactile cues.        Patient response to treatment: Good muscle use felt with exercises.  Clinical Impression Pt demonstrates increased fear of falling today observed compared to previous sessions, making it very challenging for her to ambulate with SPC. Unable to take 10 MWT and DGI with use of SPC today secondary to the fear. Focused on today's session on increasing balance confidence with gait with SPC. Pt initially averaged 50 ft with gait with SPC towards start of session with multiple attempts for furniture walk and stops. Pt able to ambulate 100 ft 2x with minimal to no attempts to furniture walk with improved step length (after cues) and improved smoothness of gait towards end of session. Pt will benefit from continued skilled physical therapy services to improve balance confidence, strength, function, and decrease difficulty with ambulation and promote ability to ambulate with least restrictive device as well as to prevent regression of progress.  Challenges to progress include observed fear of falling, decreased balance confidence as well as recent death of her husband and R tibialis anterior wound.        PT Short Term Goals - 04/25/19 1723      PT SHORT TERM GOAL #1   Title  Patient will be independent with her HEP to improve strength and balance.     Time  3    Period  Weeks    Status  On-going    Target Date  05/08/19        PT Long Term Goals - 09/03/19 1505  PT LONG TERM GOAL #1   Title  Patient will improve her 10 MWT to 0.8 m/s or more with rw to promote better community ambulation.     Baseline  0.5 seconds with rw (09/06/2018); 0.63 seconds average with rw (10/18/2018); 0.69 m/s average using rw (12/10/2018); 0.59 m/s using SPC and CGA (01/08/2019); 0.60 m/s using SPC (01/21/2019); 0.56 m/s with SPC to no AD assist, CGA (02/04/2019); 0.45 m/s average (04/17/2019); 0.53 m/s with SPC (06/06/2019); 0.26 m/s average wiht SPC (08/13/2019); unable to assess today secondary to difficulty ambulating with [facility] today (09/03/2019)    Time  8    Period  Weeks    Status  On-going      PT LONG TERM GOAL #2   Title  Patient will improve her DGI score using rw or least restrictive AD to 19/24 or more to promote balance.     Baseline  13/24 using rw (09/06/2018); 14/24 using rw (10/29/2018); 16/24 (12/10/2018); 14/24 using SPC (01/21/2019); 9/24 using Glide (04/17/2019); 14/24 (06/11/2019; unable to complete test today secondary to pt request (08/13/2019); unable to assess today secondary to difficulty ambulating with [facility] today (09/03/2019)    Time  8    Period  Weeks    Status  On-going    Target Date  10/10/19      PT LONG TERM GOAL #3   Title  Patient will improve bilateral LE strength by at least 1/2 MMT to promote ability to support herself when performing standing tasks and improve balance.     Time  8    Period  Weeks    Status  On-going    Target Date  10/10/19      PT LONG TERM GOAL #4   Title  Patient will have a decrease in back  pain/ache to 5/10 or less to promote ability to perform standing tasks with less difficulty.    Baseline  8/10 at most for the past 3 months (09/06/2018); 5-6/10 at worst for the past 7 days (10/18/2018); 5/10 at worst for the past 7 days (12/10/2018)    Time  6    Period  Weeks    Status  Achieved            Plan - 09/03/19 1429    Clinical Impression Statement  Pt demonstrates increased fear of falling today observed compared to previous sessions, making it very challenging for her to ambulate with SPC. Unable to take 10 MWT and DGI with use of SPC today secondary to the fear. Focused on today's session on increasing balance confidence with gait with SPC. Pt initially averaged 50 ft with gait with SPC towards start of session with multiple attempts for furniture walk and stops. Pt able to ambulate 100 ft 2x with minimal to no attempts to furniture walk with improved step length (after cues) and improved smoothness of gait towards end of session. Pt will benefit from continued skilled physical therapy services to improve balance confidence, strength, function, and decrease difficulty with ambulation and promote ability to ambulate with least restrictive device as well as to prevent regression of progress. Challenges to progress include observed fear of falling, decreased balance confidence as well as recent death of her husband and R tibialis anterior wound.    Personal Factors and Comorbidities  Age;Comorbidity 3+;Time since onset of injury/illness/exacerbation;Past/Current Experience    Comorbidities  Pacemaker, L elbow and R tibialis anterior wounds with difficulty healing, back surgery    Examination-Activity Limitations  Carry;Locomotion Level;Stairs  Stability/Clinical Decision Making  Evolving/Moderate complexity    Clinical Decision Making  Moderate    Clinical Presentation due to:  increased difficulty with gait with SPC    Rehab Potential  Fair    Clinical Impairments Affecting Rehab  Potential  age, fear of falling, weakness    PT Frequency  2x / week    PT Duration  8 weeks    PT Treatment/Interventions  Aquatic Therapy;Gait training;Therapeutic activities;Therapeutic exercise;Balance training;Neuromuscular re-education;Patient/family education;Manual techniques;Dry needling    PT Next Visit Plan  trunk, hip, scapular strengthening, balance, gait, manual techniques PRN    Consulted and Agree with Plan of Care  Patient       Patient will benefit from skilled therapeutic intervention in order to improve the following deficits and impairments:  Abnormal gait, Decreased activity tolerance, Decreased balance, Decreased endurance, Decreased range of motion, Decreased strength, Difficulty walking, Improper body mechanics, Postural dysfunction, Pain, Decreased knowledge of use of DME  Visit Diagnosis: Muscle weakness (generalized)  Difficulty in walking, not elsewhere classified  History of falling  Unsteadiness on feet     Problem List Patient Active Problem List   Diagnosis Date Noted  . Septic olecranon bursitis of left elbow 03/27/2018  . Lymphedema 10/11/2017  . Chronic venous insufficiency 10/11/2017  . Leg pain 10/11/2017  . Leg swelling 10/11/2017  . COPD (chronic obstructive pulmonary disease) (HCC) 10/11/2017  . Essential hypertension 10/11/2017      Thank you for your referral.   Loralyn FreshwaterMiguel Wyeth Hoffer PT, DPT   09/03/2019, 3:17 PM  [facility] [address] [place], [place], [address] Phone: [phone]   Fax:  [phone]   Name: [name] MRN: [id number] Date of Birth: 08/13/1930

## 2019-09-05 ENCOUNTER — Ambulatory Visit: Payer: Medicare Other

## 2019-09-05 ENCOUNTER — Other Ambulatory Visit: Payer: Self-pay

## 2019-09-05 ENCOUNTER — Encounter: Payer: Medicare Other | Admitting: Physician Assistant

## 2019-09-05 DIAGNOSIS — S81801A Unspecified open wound, right lower leg, initial encounter: Secondary | ICD-10-CM | POA: Diagnosis not present

## 2019-09-05 NOTE — Progress Notes (Addendum)
[name], Barnett ([id number]) Visit Report for 09/05/2019 Arrival Information Details Patient Name: [name]. Date of Service: 09/05/2019 12:45 PM Medical Record Number: [id number] Patient Account Number: [id number] Date of Birth/Sex: 04-29-1930 (85 y.o. F) Treating RN: Arnette Norris Primary Care Chukwuemeka Artola: Aram Beecham Other Clinician: Referring Shoshana Johal: Aram Beecham Treating Desera Graffeo/Extender: Linwood Dibbles, HOYT Weeks in Treatment: 9 Visit Information History Since Last Visit Added or deleted any medications: No Patient Arrived: Walker Any new allergies or adverse reactions: No Arrival Time: 13:05 Had a fall or experienced change in No Accompanied By: self activities of daily living that may affect Transfer Assistance: None risk of falls: Patient Identification Verified: Yes Signs or symptoms of abuse/neglect since last visito No Secondary Verification Process Completed: Yes Hospitalized since last visit: No Patient Requires Transmission-Based Precautions: No Has Dressing in Place as Prescribed: Yes Patient Has Alerts: No Has Compression in Place as Prescribed: Yes Pain Present Now: Yes Electronic Signature(s) Signed: 09/05/2019 4:36:59 PM By: Arnette Norris Entered By: Arnette Norris on 09/05/2019 13:06:07 [name] ([id number]) -------------------------------------------------------------------------------- Encounter Discharge Information Details Patient Name: [name] Date of Service: 09/05/2019 12:45 PM Medical Record Number: [id number] Patient Account Number: [id number] Date of Birth/Sex: 27-Jul-1930 (85 y.o. F) Treating RN: Rodell Perna Primary Care Aleigha Gilani: Aram Beecham Other Clinician: Referring Benn Tarver: Aram Beecham Treating Antrell Tipler/Extender: Linwood Dibbles, HOYT Weeks in Treatment: 9 Encounter Discharge Information Items Post Procedure Vitals Discharge Condition: Stable Temperature (F): 98.3 Ambulatory Status: Walker Pulse (bpm):  65 Discharge Destination: Home Respiratory Rate (breaths/min): 16 Transportation: Private Auto Blood Pressure (mmHg): 132/47 Accompanied By: self Schedule Follow-up Appointment: Yes Clinical Summary of Care: Electronic Signature(s) Signed: 09/05/2019 2:41:20 PM By: Rodell Perna Entered By: Rodell Perna on 09/05/2019 13:36:01 [name] ([id number]) -------------------------------------------------------------------------------- Lower Extremity Assessment Details Patient Name: [name] Date of Service: 09/05/2019 12:45 PM Medical Record Number: [id number] Patient Account Number: [id number] Date of Birth/Sex: 1930/06/26 (85 y.o. F) Treating RN: Arnette Norris Primary Care Alayja Armas: Aram Beecham Other Clinician: Referring Claron Rosencrans: Aram Beecham Treating Heer Justiss/Extender: Linwood Dibbles, HOYT Weeks in Treatment: 9 Edema Assessment Assessed: [Left: No] [Right: No] [Left: Edema] [Right: :] Calf Left: Right: Point of Measurement: 30 cm From Medial Instep cm 33.5 cm Ankle Left: Right: Point of Measurement: 10 cm From Medial Instep cm 22.7 cm Vascular Assessment Pulses: Dorsalis Pedis Palpable: [Right:Yes] Posterior Tibial Palpable: [Right:Yes] Electronic Signature(s) Signed: 09/05/2019 4:36:59 PM By: Arnette Norris Entered By: Arnette Norris on 09/05/2019 13:18:18 [name] ([id number]) -------------------------------------------------------------------------------- Multi Wound Chart Details Patient Name: [name] Date of Service: 09/05/2019 12:45 PM Medical Record Number: [id number] Patient Account Number: [id number] Date of Birth/Sex: [birth year] (85 y.o. F) Treating RN: Rodell Perna Primary Care Shameeka Silliman: Aram Beecham Other Clinician: Referring Latice Waitman: Aram Beecham Treating Kylah Maresh/Extender: Linwood Dibbles, HOYT Weeks in Treatment: 9 Vital Signs Height(in): 60 Pulse(bpm): 65 Weight(lbs): 125 Blood Pressure(mmHg): 132/47 Body Mass  Index(BMI): 24 Temperature(F): 98.3 Respiratory Rate 18 (breaths/min): Photos: [N/A:N/A] Wound Location: Right Lower Leg - Anterior N/A N/A Wounding Event: Trauma N/A N/A Primary Etiology: Skin Tear N/A N/A Comorbid History: Cataracts, Glaucoma, Optic N/A N/A Neuritis, Chronic sinus problems/congestion, Middle ear problems Date Acquired: 06/27/2019 N/A N/A Weeks of Treatment: 9 N/A N/A Wound Status: Open N/A N/A Measurements L x W x D 3x4x0.2 N/A N/A (cm) Area (cm) : 9.425 N/A N/A Volume (cm) : 1.885 N/A N/A % Reduction in Area: 11.80% N/A N/A % Reduction in Volume: 64.70% N/A N/A Classification: Partial Thickness N/A N/A Exudate Amount: Medium N/A N/A  Exudate Type: Serous N/A N/A Exudate Color: amber N/A N/A Wound Margin: Flat and Intact N/A N/A Granulation Amount: Medium (34-66%) N/A N/A Granulation Quality: Red N/A N/A Necrotic Amount: Medium (34-66%) N/A N/A Exposed Structures: Fat Layer (Subcutaneous N/A N/A Tissue) Exposed: Yes Fascia: No Tendon: No Muscle: No [name] ([id number]) Joint: No Bone: No Epithelialization: None N/A N/A Treatment Notes Electronic Signature(s) Signed: 09/05/2019 2:41:20 PM By: Army Melia Entered By: Army Melia on 09/05/2019 13:29:05 [name] ([id number]) -------------------------------------------------------------------------------- [place] Details Patient Name: [name] Date of Service: 09/05/2019 12:45 PM Medical Record Number: [id number] Patient Account Number: [id number] Date of Birth/Sex: 25-Nov-1930 (85 y.o. F) Treating RN: Army Melia Primary Care Dravyn Severs: Fulton Reek Other Clinician: Referring Jese Comella: Fulton Reek Treating Alessio Bogan/Extender: Melburn Hake, HOYT Weeks in Treatment: 9 Active Inactive Abuse / Safety / Falls / Self Care Management Nursing Diagnoses: Potential for falls Goals: Patient will remain injury free related to falls Date Initiated:  06/28/2019 Target Resolution Date: 09/21/2019 Goal Status: Active Interventions: Assess fall risk on admission and as needed Notes: Wound/Skin Impairment Nursing Diagnoses: Impaired tissue integrity Goals: Ulcer/skin breakdown will heal within 14 weeks Date Initiated: 06/28/2019 Target Resolution Date: 09/21/2019 Goal Status: Active Interventions: Assess patient/caregiver ability to obtain necessary supplies Assess patient/caregiver ability to perform ulcer/skin care regimen upon admission and as needed Assess ulceration(s) every visit Notes: Electronic Signature(s) Signed: 09/05/2019 2:41:20 PM By: Army Melia Entered By: Army Melia on 09/05/2019 13:28:56 [name] ([id number]) -------------------------------------------------------------------------------- Pain Assessment Details Patient Name: [name] Date of Service: 09/05/2019 12:45 PM Medical Record Number: [id number] Patient Account Number: [id number] Date of Birth/Sex: [birth year] (85 y.o. F) Treating RN: Harold Barban Primary Care Bethany Cumming: Fulton Reek Other Clinician: Referring [place] Willow: Fulton Reek Treating Katanya Schlie/Extender: Melburn Hake, HOYT Weeks in Treatment: 9 Active Problems Location of Pain Severity and Description of Pain Patient Has Paino Yes Site Locations Rate the pain. Current Pain Level: 5 Pain Management and Medication Current Pain Management: Electronic Signature(s) Signed: 09/05/2019 4:36:59 PM By: Harold Barban Entered By: Harold Barban on 09/05/2019 13:07:03 [name] ([id number]) -------------------------------------------------------------------------------- Patient/Caregiver Education Details Patient Name: [name] Date of Service: 09/05/2019 12:45 PM Medical Record Number: [id number] Patient Account Number: [id number] Date of Birth/Gender: 10-Mar-1930 (85 y.o. F) Treating RN: Army Melia Primary Care Physician: Fulton Reek Other  Clinician: Referring Physician: Fulton Reek Treating Physician/Extender: Sharalyn Ink in Treatment: 9 Education Assessment Education Provided To: Patient Education Topics Provided Wound/Skin Impairment: Handouts: Caring for Your Ulcer Methods: Demonstration, Explain/Verbal Responses: State content correctly Electronic Signature(s) Signed: 09/05/2019 2:41:20 PM By: Army Melia Entered By: Army Melia on 09/05/2019 13:34:46 [name] ([id number]) -------------------------------------------------------------------------------- Wound Assessment Details Patient Name: [name] Date of Service: 09/05/2019 12:45 PM Medical Record Number: [id number] Patient Account Number: [id number] Date of Birth/Sex: Oct 08, 1930 (85 y.o. F) Treating RN: Harold Barban Primary Care Audiel Scheiber: Fulton Reek Other Clinician: Referring Ellene Bloodsaw: Fulton Reek Treating Alyviah Crandle/Extender: Melburn Hake, HOYT Weeks in Treatment: 9 Wound Status Wound Number: 11 Primary Skin Tear Etiology: Wound Location: Right Lower Leg - Anterior Wound Open Wounding Event: Trauma Status: Date Acquired: 06/27/2019 Comorbid Cataracts, Glaucoma, Optic Neuritis, Chronic Weeks Of Treatment: 9 History: sinus problems/congestion, Middle ear problems Clustered Wound: No Photos Wound Measurements Length: (cm) 3 % Reduction i Width: (cm) 4 % Reduction i Depth: (cm) 0.2 Epithelializa Area: (cm) 9.425 Volume: (cm) 1.885 n Area: 11.8% n Volume: 64.7% tion: None Wound Description Classification: Partial Thickness Foul Odor Af Wound Margin: Flat  and Intact Slough/Fibri Exudate Amount: Medium Exudate Type: Serous Exudate Color: amber ter Cleansing: No no Yes Wound Bed Granulation Amount: Medium (34-66%) Exposed Structure Granulation Quality: Red Fascia Exposed: No Necrotic Amount: Medium (34-66%) Fat Layer (Subcutaneous Tissue) Exposed: Yes Necrotic Quality: Adherent Slough Tendon Exposed:  No Muscle Exposed: No Joint Exposed: No Bone Exposed: No Treatment Notes JODI, MCGURL ([id number]) Wound #11 (Right, Anterior Lower Leg) Notes prisma,gauze, conform tubi grip F Electronic Signature(s) Signed: 09/05/2019 4:36:59 PM By: Arnette Norris Entered By: Arnette Norris on 09/05/2019 13:16:29 [name] ([id number]) -------------------------------------------------------------------------------- Vitals Details Patient Name: [name] Date of Service: 09/05/2019 12:45 PM Medical Record Number: [id number] Patient Account Number: [id number] Date of Birth/Sex: [birth year] (85 y.o. F) Treating RN: Arnette Norris Primary Care Leiah Giannotti: Aram Beecham Other Clinician: Referring Abshir Paolini: Aram Beecham Treating Trachelle Low/Extender: Linwood Dibbles, HOYT Weeks in Treatment: 9 Vital Signs Time Taken: 13:00 Temperature (F): 98.3 Height (in): 60 Pulse (bpm): 65 Weight (lbs): 125 Respiratory Rate (breaths/min): 18 Body Mass Index (BMI): 24.4 Blood Pressure (mmHg): 132/47 Reference Range: 80 - 120 mg / dl Electronic Signature(s) Signed: 09/05/2019 4:36:59 PM By: Arnette Norris Entered By: Arnette Norris on 09/05/2019 13:07:46

## 2019-09-05 NOTE — Progress Notes (Addendum)
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 upcoming appointment with plastic surgery to assist with additional treatment options for the left elbow wound on 11/19/18. There is significant amount surrounding undermining of the wound is 0.9 cm. Currently prescribed methotrexate. Wound is being treated with Indoform and border dressing. No drainage from wound. No fever, chills. or pain. 11/21/18; the  patient continues to have the wound looking roughly the same with undermining from about 12 to 6:00. This has not changed all that much. She does have skin irritation around the wound that looks like drainage maceration issues. The patient states that she was not able to have her wound dressing changed because of illness in the person he usually does this. She also did not attend her clinic appointment today with Dr. Marina Goodell because of transportation issues. She is rebooked for some time in mid January 11/28/2018; the patient has less undermining using endoform. As a understandings she saw Dr. Thad Ranger who is Dr. Lonni Fix partner. He recommended putting her in a elbow brace and I believe is written a prescription for it. He also recommended Motrin 800 mg 3 times daily. This is prescription strength ibuprofen although he did not write his prescription. This apparently was for 2 weeks. Culture I did last time grew a few methicillin sensitive staph aureus. After some difficulty due to drug intolerances/allergies and drug interactions I settled on a 5-day course of azithromycin KAWTHAR, ENNEN ([id number]) 12/03/18 on evaluation today patient actually appears to be doing fairly well in regard to her elbow when compared to last time I evaluated her. With that being said there does not appear to be any signs of infection at this time. That was the big concern currently as far as the patient was concerned. Nonetheless I do feel like she is making progress in regard to the feeling of this ulcer it has been slow. She did see a Engineer, petroleum they are talking about putting her in a brace in order to allow this area to heal more appropriately. 12/19/2018; not much change in this from the last time I have saw this.'s much smaller area than when she first came in and with less circumferential undermining however this is never really adhered. She is wearing the brace that was given or prescribed to her by  plastics. She did not have a procedure offered to attempt to close this. We have been using endoform 1/15; wound actually is not doing as well as last week. She was actually not supposed to come into this clinic again until next week but apparently her attendant noticed some redness increasing pain and she came in early. She reports the same amount  of drainage. We have been using endoform. She is approved through puraply however I will only consider starting that next week 1/22; she completed the antibiotics last week. Culture I did was negative. In spite of this there is less erythema and pain complaints in the wound. Puraply #1 applied today 1/29; Puraply #2 today. Wound surface looks a lot better post debridement of adherent fibrinous material. However undermining from 6-12 is measuring worse 2/5; Puraply #3. Using her elbow brace 2/12 puraply #4 2/19 puraply #5. The 9:00 undermining measured at 0.5 cm. Undermining from 4-11 o'clock. Surface of the wound looks better and the circumference of the wound is smaller however the undermining is not really changed 2/26; still not much improvement. She has undermining from 4-9 o'clock 0.9 cm. Surface of the wound covered and adherent debris. 3/4; still no improvement. Undermining from 4-9 o'clock still around a centimeter. Surface of the wound looks somewhat better. No debridement is required we used endoform after we ended the trial of puraply last week 3/11; really no improvement at all. Still 1 cm undermining from roughly 9-3 o'clock. This is about a centimeter. The base of the wound looks fairly healthy. No debridement. We have been using endoform. I am really out of most usual options here. I could consider either another round of an amniotic advanced treatment product example epifix or perhaps regranex. Understandably the patient is a bit frustrated. We did send her to plastic surgery for a consult. Other than prescribing her a brace to  immobilize the elbow they did not think she was a candidate for any further surgery. Notable that the patient is not using the brace today 3/18-Patient returns for attention to the left elbow area which apparently looked red at the home health visit. Patient's elbow looks the same if not better compared with last visit. The area of ulceration remains the same, the base appears healthy. We are continuing to use endoform she has been encouraged to use the brace to keep the elbow straight 3/25; the patient [facility] on [facility]. I had actually put her out indefinitely however she seems to want to come back here every week. This week she complains of increased pain and malodor. Dimensions of the elbow wound are larger. We have been using endoform 4/8; [facility] her meta honey and some border foam with a Tubigrip. She has been using this for a week. She says she was very impressed with them there. They did not offer her any surgical consultation. She seems to be coming back here for a second opinion on this, she does not wish to drive to [facility] every week 1/614/22; still using Medihoney foam border and a Tubigrip. Actually do not think she has anything on the arm at all specifically she is not using her brace 5/6; she has been using medihoney without a lot of improvement. Undermining maximum at 9:00 at 0.5 cm may be somewhat better. She is still complaining of discomfort. She uses her elbow brace at night but is not using anything on the arm during the day, she finds it too restrictive 5/13; we switch the patient to endoform AG last week. She is complaining of more pain and worried about some circumferential erythema around the wound. I had planned to consider epifix in this wound however the patient came in with a request to see a [place] in [place] by the name of Dr. Wyline MoodBranch  who cared for a friend of hers. Noteworthy  that I have already sent her to one plastic surgeon and she went for another second opinion at [facility] and they apparently did not send her to a plastic surgeon nevertheless the patient is fairly convinced that she might benefit from a skin graft which I am doubtful. She also has rheumatoid arthritis and is on methotrexate. This was originally a surgical wound for a bursectomy a year or 2 ago Readmission: 06/28/19 on evaluation today patient presents today for reevaluation but this is due to a new issue her elbow has completely healed and looks excellent. Her right anterior lower leg has a skin tear which was sustained from her dog who jumped up on her and inadvertently scratch the area causing the skin tear. This happened yesterday. She is having some discomfort but fortunately nothing too significant which is good news. No fevers, chills, nausea, or vomiting noted at this time. The skin fortunately was knocked one completely off and we are gonna see about we approximate in the skin as best we can in using Steri-Strips to hold this in place obviously if we can get some of this to reattach that would be beneficial KAMARIE, VENO. ([id number]) 07/05/2019 on evaluation today patient actually appears to be doing okay although unfortunately the skin flap that we were attempting to Steri-Stripped down last week did not take. She is developing a lot of fluid underneath the wound area unfortunately which again is not ideal. I think that this necrotic tissue needs to be removed and again it actually just wiped off during the evaluation today as I was attempting to clean the wound there did not appear to be any significant issues underlying which is good although there was some purulent drainage I did want to go ahead and see about obtaining a culture from today in order to ensure that the Z-Pak that I placed her on earlier in the week was appropriate for treating what ever infection may be causing the issue  currently. This was a deep wound culture obtained today. 07/12/2019 on evaluation today patient actually appears to be doing quite well with regard to her right lower extremity ulcer all things considering. There is still little bit of hematoma not it that is noted in the central portion of the wound along with some necrotic tissue but she is still having quite a bit of discomfort. For that reason I did not perform sharp debridement today although this wound does need some debridement of one type or another. I think we may attempt Iodoflex to see if this can be of benefit. Fortunately there is no signs of active infection at this point. 07/18/19 upon evaluation today patient appears to be doing better with regard to her ulcer on her right lower extremity. She's been tolerating the dressing changes without complication. The good news is she seems to be making excellent progress. Overall I'm pleased with there being no signs of infection. With that being said she does tell me that she's been having some discomfort with the wrap she's unsure of exactly what about the rafters causing her trouble. 07/25/2019 on evaluation today patient appears to be doing a little better in regard to her lower extremity ulcer. Unfortunately the alginate seems to be getting really stuck to the wound bed and surrounding periwound. Fortunately there is no signs of active infection at this time. There does not appear to be any evidence of infection currently. 08/01/2019 on evaluation today patient actually  appears to be doing much better with regard to her right lower extremity ulcer. She has been tolerating the dressing changes without complication. Fortunately her wound does not show any signs of infection and seems to be making good progress. No fevers, chills, nausea, vomiting, or diarrhea. 08/08/2019 on evaluation today patient actually appears to be doing better with regard to her leg ulcer. She has been tolerating the  dressing changes without complication. With that being said I think we may want to switch the dressing up today just based on the appearance of the wound bed in general. Fortunately there is no evidence of infection currently. Her pain seems to be much better. Her son is present with her today. 08/15/2019 on evaluation today patient appears to be doing very well with regard to her right lower extremity ulcer. She still complains about feeling like the compression stocking/Tubigrip is too tight for her. Nonetheless I still think this is beneficial and is helping the wound to heal more effectively. Overall I am extremely pleased at this time with what I am seeing. 08/22/2019 on evaluation today patient actually appears to be doing quite well with regard to her right lower extremity. She has been tolerating the dressing changes without complication. Fortunately there is no signs of active infection at this time. She still complains about the Tubigrip but nonetheless I think this is something that is of utmost importance for her to continue to wear if she is going to see this area heal appropriately. 08/29/2019 on evaluation today patient actually appears to be doing well visually in regard to her wound. Unfortunately in regard to overall pain she seems to be having more pain today which I am somewhat concerned about. There does not appear to be any signs of active infection that I can tell but again with increased pain that is definitely a concern here today. I do think it may be time to switch up the dressing as well to something that will be a little bit more effective hopefully and new tissue growth she has done well with collagen in the past. 09/05/2019 on evaluation today patient actually appears to be doing well with regard to her leg ulcer. She has been tolerating the dressing changes without complication. Fortunately there is no signs of active infection. We did obtain a wound culture last week due  to the fact that she still is very touchy as far as the surface of the wound is concerned. Unfortunately I am not sure what happened to that culture as there is no record in the computer system. Nonetheless we need to follow-up on this and try to see if we can identify what exactly happened with this specimen as we have not gotten a result back either obviously. Patient History Information obtained from Patient. Family History LIHANNA, BIEVER ([id number]) Heart Disease - Siblings,Father, No family history of Cancer, Diabetes, Hereditary Spherocytosis, Hypertension, Kidney Disease, Lung Disease, Seizures, Stroke, Thyroid Problems, Tuberculosis. Social History Never smoker, Alcohol Use - Daily, Drug Use - No History, Caffeine Use - Never. Medical History Eyes Patient has history of Cataracts, Glaucoma, Optic Neuritis Ear/Nose/Mouth/Throat Patient has history of Chronic sinus problems/congestion, Middle ear problems Medical And Surgical History Notes Constitutional Symptoms (General Health) Back pain Ear/Nose/Mouth/Throat bilateral hearing aides Review of Systems (ROS) Constitutional Symptoms ([place]) Denies complaints or symptoms of Fatigue, Fever, Chills, Marked Weight Change. Respiratory Denies complaints or symptoms of Chronic or frequent coughs, Shortness of Breath. Cardiovascular Denies complaints or symptoms of Chest pain,  LE edema. Psychiatric Denies complaints or symptoms of Anxiety, Claustrophobia. Objective Constitutional Well-nourished and well-hydrated in no acute distress. Vitals Time Taken: 1:00 PM, Height: 60 in, Weight: 125 lbs, BMI: 24.4, Temperature: 98.3 F, Pulse: 65 bpm, Respiratory Rate: 18 breaths/min, Blood Pressure: 132/47 mmHg. Respiratory normal breathing without difficulty. clear to auscultation bilaterally. Cardiovascular regular rate and rhythm with normal S1, S2. Psychiatric this patient is able to make decisions and demonstrates good  insight into disease process. Alert and Oriented x 3. pleasant and cooperative. General Notes: Patient's wound bed currently showed some signs of slough and biofilm buildup on the surface of the wound that did require some sharp debridement today. I did this as carefully as possible she still seem to have pain but it did not [place], [place] V. ([id number]) appear to be quite as bad as last week. Nonetheless I do think that I am going to obtain a new culture just in case the other one cannot be found we will send this but again I would wait and see if we can find the original culture from last week first obviously that would be best. Integumentary (Hair, Skin) Wound #11 status is Open. Original cause of wound was Trauma. The wound is located on the Right,Anterior Lower Leg. The wound measures 3cm length x 4cm width x 0.2cm depth; 9.425cm^2 area and 1.885cm^3 volume. There is Fat Layer (Subcutaneous Tissue) Exposed exposed. There is a medium amount of serous drainage noted. The wound margin is flat and intact. There is medium (34-66%) red granulation within the wound bed. There is a medium (34-66%) amount of necrotic tissue within the wound bed including Adherent Slough. Assessment Active Problems ICD-10 Unspecified open wound, right lower leg, initial encounter Non-pressure chronic ulcer of other part of right lower leg with fat layer exposed Struck by dog, initial encounter Procedures Wound #11 Pre-procedure diagnosis of Wound #11 is a Skin Tear located on the Right,Anterior Lower Leg . There was a Excisional Skin/Subcutaneous Tissue Debridement with a total area of 12 sq cm performed by STONE III, Kelsi Benham E., PA-C. With the following instrument(s): Curette to remove Viable and Non-Viable tissue/material. Material removed includes Subcutaneous Tissue and Slough and after achieving pain control using Lidocaine. A time out was conducted at 13:33, prior to the start of the procedure. A Minimum  amount of bleeding was controlled with Pressure. The procedure was tolerated well. Post Debridement Measurements: 3cm length x 4cm width x 0.2cm depth; 1.885cm^3 volume. Character of Wound/Ulcer Post Debridement is stable. Post procedure Diagnosis Wound #11: Same as Pre-Procedure Plan Wound Cleansing: Wound #11 Right,Anterior Lower Leg: Clean wound with Normal Saline. Primary Wound Dressing: Wound #11 Right,Anterior Lower Leg: Silver Collagen Secondary Dressing: Wound #11 Right,Anterior Lower Leg: Dry Gauze Conform/Kerlix Dressing Change Frequency: SANIYAH, MONDESIR ([id number]) Wound #11 Right,Anterior Lower Leg: Change dressing every other day. Follow-up Appointments: Wound #11 Right,Anterior Lower Leg: Return Appointment in 1 week. Edema Control: Wound #11 Right,Anterior Lower Leg: Other: - tubi grip F 1. I would recommend that we continue with the current wound care measures including the silver collagen at this time. 2. I am in a suggest as well that we continue to secure this with gauze and then subsequently utilize the Tubigrip as that does help with her edema. 3. I am also going to suggest at this point that the patient continue with the every other day dressing changes in order to ensure that the collagen has time to work in between. 4. With regard to the wound  culture we did obtain a new specimen case we need to send this but again I am not sure what happened to the original specimen that we sent last week. We will see patient back for reevaluation in 1 week here in the clinic. If anything worsens or changes patient will contact our office for additional recommendations. Electronic Signature(s) Signed: 09/05/2019 1:41:13 PM By: Lenda KelpStone III, Shawna Kiener PA-C Entered By: Lenda KelpStone III, Darleny Sem on 09/05/2019 13:41:12 Natasha BassetHENRY, Alondria V. ([id number]) -------------------------------------------------------------------------------- ROS/PFSH Details Patient Name: Natasha BassetHENRY, Tyler V. Date of  Service: 09/05/2019 12:45 PM Medical Record Number: [id number] Patient Account Number: [id number] Date of Birth/Sex: 12/16/1929 (85 y.o. F) Treating RN: Rodell PernaScott, Dajea Primary Care Provider: Aram BeechamSparks, Jeffrey Other Clinician: Referring Provider: Aram BeechamSparks, Jeffrey Treating Provider/Extender: Linwood DibblesSTONE III, Selden Noteboom Weeks in Treatment: 9 Information Obtained From Patient Constitutional Symptoms (General Health) Complaints and Symptoms: Negative for: Fatigue; Fever; Chills; Marked Weight Change Medical History: Past Medical History Notes: Back pain Respiratory Complaints and Symptoms: Negative for: Chronic or frequent coughs; Shortness of Breath Cardiovascular Complaints and Symptoms: Negative for: Chest pain; LE edema Psychiatric Complaints and Symptoms: Negative for: Anxiety; Claustrophobia Eyes Medical History: Positive for: Cataracts; Glaucoma; Optic Neuritis Ear/Nose/Mouth/Throat Medical History: Positive for: Chronic sinus problems/congestion; Middle ear problems Past Medical History Notes: bilateral hearing aides HBO Extended History Items Ear/Nose/Mouth/Throat: Eyes: Eyes: Ear/Nose/Mouth/Throat: Chronic sinus Cataracts Glaucoma Middle ear problems problems/congestion Immunizations Pneumococcal Vaccine: Received Pneumococcal Vaccination: No Implantable Devices Natasha BassetHENRY, Timberlyn V. ([id number]) Yes Family and Social History Cancer: No; Diabetes: No; Heart Disease: Yes - Siblings,Father; Hereditary Spherocytosis: No; Hypertension: No; Kidney Disease: No; Lung Disease: No; Seizures: No; Stroke: No; Thyroid Problems: No; Tuberculosis: No; Never smoker; Alcohol Use: Daily; Drug Use: No History; Caffeine Use: Never; Financial Concerns: No; Food, Clothing or Shelter Needs: No; Support System Lacking: No; Transportation Concerns: No Physician Affirmation I have reviewed and agree with the above information. Electronic Signature(s) Signed: 09/05/2019 2:41:20 PM By: Rodell PernaScott, Dajea Signed:  09/05/2019 4:47:50 PM By: Lenda KelpStone III, Chareese Sergent PA-C Entered By: Lenda KelpStone III, Princesa Willig on 09/05/2019 13:39:56 Natasha BassetHENRY, Jaydi V. ([id number]) -------------------------------------------------------------------------------- SuperBill Details Patient Name: Natasha BassetHENRY, Mirah V. Date of Service: 09/05/2019 Medical Record Number: [id number] Patient Account Number: [id number] Date of Birth/Sex: 09/09/1930 (85 y.o. F) Treating RN: Rodell PernaScott, Dajea Primary Care Provider: Aram BeechamSparks, Jeffrey Other Clinician: Referring Provider: Aram BeechamSparks, Jeffrey Treating Provider/Extender: Linwood DibblesSTONE III, Ignacio Lowder Weeks in Treatment: 9 Diagnosis Coding ICD-10 Codes Code Description S81.801A Unspecified open wound, right lower leg, initial encounter L97.812 Non-pressure chronic ulcer of other part of right lower leg with fat layer exposed W54.1XXA Struck by dog, initial Scientist, physiologicalencounter Facility Procedures CPT4 Code Description: [id number] - DEB SUBQ TISSUE 20 SQ CM/< ICD-10 Diagnosis Description L97.812 Non-pressure chronic ulcer of other part of right lower leg wit Modifier: h fat layer expo Quantity: 1 sed Physician Procedures CPT4 Code Description: [id number] 99214 - WC PHYS LEVEL 4 - EST PT ICD-10 Diagnosis Description S81.801A Unspecified open wound, right lower leg, initial encounter L97.812 Non-pressure chronic ulcer of other part of right lower leg wit W54.1XXA Struck by  dog, initial encounter Modifier: 25 h fat layer expo Quantity: 1 sed CPT4 Code Description: [id number] 11042 - WC PHYS SUBQ TISS 20 SQ CM ICD-10 Diagnosis Description L97.812 Non-pressure chronic ulcer of other part of right lower leg wit Modifier: h fat layer expo Quantity: 1 sed Electronic Signature(s) Signed: 09/05/2019 1:41:33 PM By: Lenda KelpStone III, Robi Dewolfe PA-C Entered By: Lenda KelpStone III, Aalaysia Liggins on 09/05/2019 13:41:32

## 2019-09-06 ENCOUNTER — Other Ambulatory Visit
Admission: RE | Admit: 2019-09-06 | Discharge: 2019-09-06 | Disposition: A | Discharge status: Home / Self Care | Payer: Medicare Other | Source: Ambulatory Visit | Attending: Physician Assistant | Admitting: Physician Assistant

## 2019-09-06 DIAGNOSIS — B999 Unspecified infectious disease: Secondary | ICD-10-CM | POA: Insufficient documentation

## 2019-09-08 LAB — AEROBIC CULTURE W GRAM STAIN (SUPERFICIAL SPECIMEN)
Culture: NORMAL
Gram Stain: NONE SEEN

## 2019-09-10 ENCOUNTER — Other Ambulatory Visit: Payer: Self-pay

## 2019-09-10 ENCOUNTER — Ambulatory Visit: Payer: Medicare Other

## 2019-09-10 DIAGNOSIS — M6281 Muscle weakness (generalized): Secondary | ICD-10-CM

## 2019-09-10 DIAGNOSIS — R2681 Unsteadiness on feet: Secondary | ICD-10-CM

## 2019-09-10 DIAGNOSIS — Z9181 History of falling: Secondary | ICD-10-CM

## 2019-09-10 DIAGNOSIS — R262 Difficulty in walking, not elsewhere classified: Secondary | ICD-10-CM

## 2019-09-10 NOTE — Therapy (Signed)
[place] [facility] [address] [place], [address] Phone: [phone]   Fax:  [phone]  Physical Therapy Treatment  Patient Details  Name: [name] MRN: [id number] Date of Birth: [birth year] Referring Provider (PT): Jennings Books, MD   Encounter Date: 09/10/2019  PT End of Session - 09/10/19 1402    Visit Number  61    Number of Visits  110    Date for PT Re-Evaluation  10/10/19    Authorization Type  1    Authorization Time Period  10    PT Start Time  1402    PT Stop Time  1447    PT Time Calculation (min)  45 min    Activity Tolerance  Patient tolerated treatment well;Patient limited by fatigue    Behavior During Therapy  [facility] for tasks assessed/performed       Past Medical History:  Diagnosis Date  . Anemia   . GERD (gastroesophageal reflux disease)   . Hypertension   . Peripheral vascular disease (HCC)    possible neuropathies in lower extremeties  . PONV (postoperative nausea and vomiting)    happens sometimes but better with pre med of zofran  . Presence of permanent cardiac pacemaker   . Syncope     Past Surgical History:  Procedure Laterality Date  . ABDOMINAL HYSTERECTOMY  1969  . APPENDECTOMY  1969   with hysterectomy  . BACK SURGERY  2001   rods in back. surgery on back x 3  . COLONOSCOPY    . I&D EXTREMITY Left 03/27/2018   Procedure: IRRIGATION AND DEBRIDEMENT LEFT ELBOW / OLECRANON BURSA;  Surgeon: Corky Mull, MD;  Location: [facility] ORS;  Service: Orthopedics;  Laterality: Left;  . I&D EXTREMITY Left 06/05/2018   Procedure: IRRIGATION AND DEBRIDEMENT EXTREMITY;  Surgeon: Corky Mull, MD;  Location: [facility] OR[facility];  Service: Orthopedics;  Laterality: Left;  . INSERT / REPLACE / REMOVE PACEMAKER  2004  . JOINT REPLACEMENT Left 2004   partial knee replacement  . OLECRANON BURSECTOMY Left 03/27/2018   Procedure: LEFT OLECRANON BURSA;  Surgeon: Corky Mull, MD;  Location: [facility] OR[facility];   Service: Orthopedics;  Laterality: Left;  . PACEMAKER INSERTION  2014   dual chamber for complete heart block    There were no vitals filed for this visit.  Subjective Assessment - 09/10/19 1403    Subjective  Took a shower for the first time in a few weeks. Has trouble with balance. Some days are not as good as others. L foot is numb but can feel.    Pertinent History  Imparied gait. Pt states that she has had balance issues for too long. Feels more comfortable walking with her rw. Had tests which showed that she has neuropathy. However the doctor at [facility] said that her balance is coming from her back. Just wants to be strong so she can walk.  Started walking with her rw off and on for the past several months.  Also feels like she might need a new sleep number bed. Also had 2 wound surgeries at [name]. Pt fell in February 2019. Had to stop PT due to the L elbow surgery April 06, 2018 to remove a bursa. The wound still has not healed.   Currently sees a wound doctor.   Feels numbness L foot from the ankle down.  No R foot numbness.  Sometimes unsure of where her L LE is in space.  Pt also states that she tires too quickly.  Has not been able to walk her dog in 2 years. Has not really been doing exercises at home except the sit <> stand. Pt states that prolonged sitting without back support will increase back pain     Currently in Pain?  No/denies   except R leg wound                              PT Education - 09/10/19 1407    Education provided  Yes    Education Details  ther-ex    Starwood HotelsPerson(s) Educated  Patient    Methods  Explanation;Demonstration;Tactile cues;Verbal cues    Comprehension  Verbalized understanding;Returned demonstration        OBJECTIVE  Pt ambulating with rw   No latex band allergies    MedbridgeAccess Code: [email or web address]   Pt has pacemaker  Wound with dressing R tibialis anterior muscle.  Therapeutic  exercise:   seated trunk extension isometrics at neutral, manually resisted10x10secondsfor2sets  SLS with light touch UE assist to promote glute med muscle strengthening and balance.  L 10x5 seconds R 10x5 seconds  Standing hip abduction with B UE assist 3 lbs  R 10x3  L 10x3  Forward step up onto 4 inch step with contralateral UE assist   R 10x2  L 10x2  standing alternating toe taps onto treadmill platform with light touch assist.   10x2 each LE   LOB x 1, PT mod A to recover.    Standing mini squats with light touch to no UE assist 10x    Improved exercise technique, movement at target joints, use of target muscles after min to mod verbal, visual, tactile cues.     Gait training  Gait with SPC on R side, CGA 100 ft x 2 Able to ambulate with slightly more confidence observed when pt is distracted talking about a topic she is interested in such as dogs   Some unsteadiness secondary to fear of falling and uncertain with her feet. LOB x 1, PT min A to recover.     Improved technique, movement at target joints, use of target muscles after min to mod verbal, visual, tactile cues.        Patient response to treatment: Good muscle use felt with exercises.  Clinical Impression Continued working on LE strengthening and balance to promote ability to support herself with her LE and decrease fall risk. Also continued working on gait with SPC to help return pt to her prior level of function. Better able to ambulate today [facility] when pt is distracted, talking about a topic that she likes, decreasing focus on fear of falling observed. Pt will benefit from continued skilled physical therapy services to improve LE strength, function, balance, and decrease falling.    PT Short Term Goals - 04/25/19 1723      PT SHORT TERM GOAL #1   Title  Patient will be independent with her HEP to improve strength and  balance.     Time  3    Period  Weeks    Status  On-going    Target Date  05/08/19        PT Long Term Goals - 09/03/19 1505      PT LONG TERM GOAL #1   Title  Patient will improve her 10 MWT to 0.8 m/s or more with rw to promote better community  ambulation.     Baseline  0.5 seconds with rw (09/06/2018); 0.63 seconds average with rw (10/18/2018); 0.69 m/s average using rw (12/10/2018); 0.59 m/s using SPC and CGA (01/08/2019); 0.60 m/s using SPC (01/21/2019); 0.56 m/s with SPC to no AD assist, CGA (02/04/2019); 0.45 m/s average (04/17/2019); 0.53 m/s with SPC (06/06/2019); 0.26 m/s average wiht SPC (08/13/2019); unable to assess today secondary to difficulty ambulating with [facility] today (09/03/2019)    Time  8    Period  Weeks    Status  On-going      PT LONG TERM GOAL #2   Title  Patient will improve her DGI score using rw or least restrictive AD to 19/24 or more to promote balance.     Baseline  13/24 using rw (09/06/2018); 14/24 using rw (10/29/2018); 16/24 (12/10/2018); 14/24 using SPC (01/21/2019); 9/24 using SPC (04/17/2019); 14/24 (06/11/2019; unable to complete test today secondary to pt request (08/13/2019); unable to assess today secondary to difficulty ambulating with [facility] today (09/03/2019)    Time  8    Period  Weeks    Status  On-going    Target Date  10/10/19      PT LONG TERM GOAL #3   Title  Patient will improve bilateral LE strength by at least 1/2 MMT to promote ability to support herself when performing standing tasks and improve balance.     Time  8    Period  Weeks    Status  On-going    Target Date  10/10/19      PT LONG TERM GOAL #4   Title  Patient will have a decrease in back pain/ache to 5/10 or less to promote ability to perform standing tasks with less difficulty.    Baseline  8/10 at most for the past 3 months (09/06/2018); 5-6/10 at worst for the past 7 days (10/18/2018); 5/10 at worst for the past 7 days (12/10/2018)    Time  6    Period  Weeks    Status  Achieved             Plan - 09/10/19 1407    Clinical Impression Statement  Continued working on LE strengthening and balance to promote ability to support herself with her LE and decrease fall risk. Also continued working on gait with SPC to help return pt to her prior level of function. Better able to ambulate today compared to last week with [facility] when pt is distracted, talking about a topic that she likes, decreasing focus on fear of falling observed. Pt will benefit from continued skilled physical therapy services to improve LE strength, function, balance, and decrease falling.    Personal Factors and Comorbidities  Age;Comorbidity 3+;Time since onset of injury/illness/exacerbation;Past/Current Experience    Comorbidities  Pacemaker, L elbow and R tibialis anterior wounds with difficulty healing, back surgery    Examination-Activity Limitations  Carry;Locomotion Level;Stairs    Stability/Clinical Decision Making  Evolving/Moderate complexity    Rehab Potential  Fair    Clinical Impairments Affecting Rehab Potential  age, fear of falling, weakness    PT Frequency  2x / week    PT Duration  8 weeks    PT Treatment/Interventions  Aquatic Therapy;Gait training;Therapeutic activities;Therapeutic exercise;Balance training;Neuromuscular re-education;Patient/family education;Manual techniques;Dry needling    PT Next Visit Plan  trunk, hip, scapular strengthening, balance, gait, manual techniques PRN    Consulted and Agree with Plan of Care  Patient       Patient will benefit from skilled therapeutic intervention  in order to improve the following deficits and impairments:  Abnormal gait, Decreased activity tolerance, Decreased balance, Decreased endurance, Decreased range of motion, Decreased strength, Difficulty walking, Improper body mechanics, Postural dysfunction, Pain, Decreased knowledge of use of DME  Visit Diagnosis: Muscle weakness (generalized)  Difficulty in walking, not elsewhere  classified  History of falling  Unsteadiness on feet     Problem List Patient Active Problem List   Diagnosis Date Noted  . Septic olecranon bursitis of left elbow 03/27/2018  . Lymphedema 10/11/2017  . Chronic venous insufficiency 10/11/2017  . Leg pain 10/11/2017  . Leg swelling 10/11/2017  . COPD (chronic obstructive pulmonary disease) (HCC) 10/11/2017  . Essential hypertension 10/11/2017    Loralyn Freshwater PT, DPT   09/10/2019, 3:12 PM  [facility] [address] [place], [address] Phone: [phone]   Fax:  [phone]  Name: [name] MRN: [id number] Date of Birth: [birth year]

## 2019-09-12 ENCOUNTER — Other Ambulatory Visit: Payer: Self-pay

## 2019-09-12 ENCOUNTER — Ambulatory Visit: Payer: Medicare Other | Attending: Neurology

## 2019-09-12 ENCOUNTER — Encounter: Payer: Medicare Other | Attending: Physician Assistant | Admitting: Physician Assistant

## 2019-09-12 DIAGNOSIS — Z9181 History of falling: Secondary | ICD-10-CM | POA: Insufficient documentation

## 2019-09-12 DIAGNOSIS — R262 Difficulty in walking, not elsewhere classified: Secondary | ICD-10-CM | POA: Diagnosis present

## 2019-09-12 DIAGNOSIS — M545 Low back pain: Secondary | ICD-10-CM | POA: Insufficient documentation

## 2019-09-12 DIAGNOSIS — M6281 Muscle weakness (generalized): Secondary | ICD-10-CM

## 2019-09-12 DIAGNOSIS — G8929 Other chronic pain: Secondary | ICD-10-CM | POA: Insufficient documentation

## 2019-09-12 DIAGNOSIS — R2681 Unsteadiness on feet: Secondary | ICD-10-CM | POA: Insufficient documentation

## 2019-09-12 DIAGNOSIS — L97819 Non-pressure chronic ulcer of other part of right lower leg with unspecified severity: Secondary | ICD-10-CM | POA: Diagnosis present

## 2019-09-12 NOTE — Progress Notes (Signed)
[name], [name] ([id number]) Visit Report for 09/12/2019 Arrival Information Details Patient Name: [name], [name]. Date of Service: 09/12/2019 10:45 AM Medical Record Number: [id number] Patient Account Number: [id number] Date of Birth/Sex: 02-12-1930 (85 y.o. F) Treating RN: Army Melia Primary Care Kim Lauver: Fulton Reek Other Clinician: Referring Camar Guyton: Fulton Reek Treating Madelaine Whipple/Extender: Melburn Hake, HOYT Weeks in Treatment: 10 Visit Information History Since Last Visit Added or deleted any medications: No Patient Arrived: Walker Any new allergies or adverse reactions: No Arrival Time: 10:50 Had a fall or experienced change in No Accompanied By: self activities of daily living that may affect Transfer Assistance: None risk of falls: Patient Identification Verified: Yes Signs or symptoms of abuse/neglect since last visito No Secondary Verification Process Completed: Yes Hospitalized since last visit: No Patient Requires Transmission-Based Precautions: No Implantable device outside of the clinic excluding No Patient Has Alerts: No cellular tissue based products placed in the center since last visit: Has Dressing in Place as Prescribed: Yes Pain Present Now: No Electronic Signature(s) Signed: 09/12/2019 4:27:18 PM By: Lorine Bears RCP, RRT, CHT Entered By: Lorine Bears on 09/12/2019 10:53:11 [name] ([id number]) -------------------------------------------------------------------------------- Clinic Level of Care Assessment Details Patient Name: [name] Date of Service: 09/12/2019 10:45 AM Medical Record Number: [id number] Patient Account Number: [id number] Date of Birth/Sex: 10/16/1930 (85 y.o. F) Treating RN: Army Melia Primary Care Suellen Durocher: Fulton Reek Other Clinician: Referring Breckan Cafiero: Fulton Reek Treating Sahirah Rudell/Extender: Melburn Hake, HOYT Weeks in Treatment: 10 Clinic Level of Care Assessment  Items TOOL 4 Quantity Score []  - Use when only an EandM is performed on FOLLOW-UP visit 0 ASSESSMENTS - Nursing Assessment / Reassessment X - Reassessment of Co-morbidities (includes updates in patient status) 1 10 X- 1 5 Reassessment of Adherence to Treatment Plan ASSESSMENTS - Wound and Skin Assessment / Reassessment X - Simple Wound Assessment / Reassessment - one wound 1 5 []  - 0 Complex Wound Assessment / Reassessment - multiple wounds []  - 0 Dermatologic / Skin Assessment (not related to wound area) ASSESSMENTS - Focused Assessment []  - Circumferential Edema Measurements - multi extremities 0 []  - 0 Nutritional Assessment / Counseling / Intervention []  - 0 Lower Extremity Assessment (monofilament, tuning fork, pulses) []  - 0 Peripheral Arterial Disease Assessment (using hand held doppler) ASSESSMENTS - Ostomy and/or Continence Assessment and Care []  - Incontinence Assessment and Management 0 []  - 0 Ostomy Care Assessment and Management (repouching, etc.) PROCESS - Coordination of Care X - Simple Patient / Family Education for ongoing care 1 15 []  - 0 Complex (extensive) Patient / Family Education for ongoing care []  - 0 Staff obtains Programmer, systems, Records, Test Results / Process Orders []  - 0 Staff telephones HHA, Nursing Homes / Clarify orders / etc []  - 0 Routine Transfer to another Facility (non-emergent condition) []  - 0 Routine Hospital Admission (non-emergent condition) []  - 0 New Admissions / Biomedical engineer / Ordering NPWT, Apligraf, etc. []  - 0 Emergency Hospital Admission (emergent condition) X- 1 10 Simple Discharge Coordination [name], [name] ([id number]) []  - 0 Complex (extensive) Discharge Coordination PROCESS - Special Needs []  - Pediatric / Minor Patient Management 0 []  - 0 Isolation Patient Management []  - 0 Hearing / Language / Visual special needs []  - 0 Assessment of Community assistance (transportation, D/C planning, etc.) []  -  0 Additional assistance / Altered mentation []  - 0 Support Surface(s) Assessment (bed, cushion, seat, etc.) INTERVENTIONS - Wound Cleansing / Measurement X - Simple Wound Cleansing - one wound 1 5 []  -  0 Complex Wound Cleansing - multiple wounds X- 1 5 Wound Imaging (photographs - any number of wounds) []  - 0 Wound Tracing (instead of photographs) X- 1 5 Simple Wound Measurement - one wound []  - 0 Complex Wound Measurement - multiple wounds INTERVENTIONS - Wound Dressings []  - Small Wound Dressing one or multiple wounds 0 X- 1 15 Medium Wound Dressing one or multiple wounds []  - 0 Large Wound Dressing one or multiple wounds []  - 0 Application of Medications - topical []  - 0 Application of Medications - injection INTERVENTIONS - Miscellaneous []  - External ear exam 0 []  - 0 Specimen Collection (cultures, biopsies, blood, body fluids, etc.) []  - 0 Specimen(s) / Culture(s) sent or taken to Lab for analysis []  - 0 Patient Transfer (multiple staff / Nurse, adultHoyer Lift / Similar devices) []  - 0 Simple Staple / Suture removal (25 or less) []  - 0 Complex Staple / Suture removal (26 or more) []  - 0 Hypo / Hyperglycemic Management (close monitor of Blood Glucose) []  - 0 Ankle / Brachial Index (ABI) - do not check if billed separately X- 1 5 Vital Signs [name], [name] ([id number]) Has the patient been seen at the hospital within the last three years: Yes Total Score: 80 Level Of Care: New/Established - Level 3 Electronic Signature(s) Signed: 09/12/2019 5:01:07 PM By: Rodell PernaScott, Dajea Entered By: Rodell PernaScott, Dajea on 09/12/2019 11:07:30 [name], [name] ([id number]) -------------------------------------------------------------------------------- Encounter Discharge Information Details Patient Name: [name], [name] Date of Service: 09/12/2019 10:45 AM Medical Record Number: [id number] Patient Account Number: [id number] Date of Birth/Sex: 02/20/1930 (85 y.o. F) Treating RN: Rodell PernaScott,  Dajea Primary Care Rashana Andrew: Aram BeechamSparks, Jeffrey Other Clinician: Referring Kirsta Probert: Aram BeechamSparks, Jeffrey Treating Camrynn Mcclintic/Extender: Linwood DibblesSTONE III, HOYT Weeks in Treatment: 10 Encounter Discharge Information Items Discharge Condition: Stable Ambulatory Status: Walker Discharge Destination: Home Transportation: Private Auto Accompanied By: self Schedule Follow-up Appointment: Yes Clinical Summary of Care: Electronic Signature(s) Signed: 09/12/2019 5:01:07 PM By: Rodell PernaScott, Dajea Entered By: Rodell PernaScott, Dajea on 09/12/2019 11:08:53 [name], [name] ([id number]) -------------------------------------------------------------------------------- Lower Extremity Assessment Details Patient Name: [name], [name] Date of Service: 09/12/2019 10:45 AM Medical Record Number: [id number] Patient Account Number: [id number] Date of Birth/Sex: 10/25/1930 (85 y.o. F) Treating RN: Curtis Sitesorthy, Joanna Primary Care Jake Fuhrmann: Aram BeechamSparks, Jeffrey Other Clinician: Referring Radley Teston: Aram BeechamSparks, Jeffrey Treating Antwoin Lackey/Extender: Linwood DibblesSTONE III, HOYT Weeks in Treatment: 10 Edema Assessment Assessed: [Left: No] [Right: No] Edema: [Left: Ye] [Right: s] Calf Left: Right: Point of Measurement: 30 cm From Medial Instep cm 34 cm Ankle Left: Right: Point of Measurement: 10 cm From Medial Instep cm 23 cm Vascular Assessment Pulses: Dorsalis Pedis Palpable: [Right:Yes] Electronic Signature(s) Signed: 09/12/2019 5:10:54 PM By: Curtis Sitesorthy, Joanna Entered By: Curtis Sitesorthy, Joanna on 09/12/2019 10:59:03 [name], [name] ([id number]) -------------------------------------------------------------------------------- Multi Wound Chart Details Patient Name: [name], [name] Date of Service: 09/12/2019 10:45 AM Medical Record Number: [id number] Patient Account Number: [id number] Date of Birth/Sex: 09/03/1930 (85 y.o. F) Treating RN: Rodell PernaScott, Dajea Primary Care Curley Fayette: Aram BeechamSparks, Jeffrey Other Clinician: Referring Lasondra Hodgkins: Aram BeechamSparks, Jeffrey Treating Candon Caras/Extender:  Linwood DibblesSTONE III, HOYT Weeks in Treatment: 10 Vital Signs Height(in): 60 Pulse(bpm): 70 Weight(lbs): 125 Blood Pressure(mmHg): 142/45 Body Mass Index(BMI): 24 Temperature(F): 98.4 Respiratory Rate 16 (breaths/min): Photos: [N/A:N/A] Wound Location: Right Lower Leg - Anterior N/A N/A Wounding Event: Trauma N/A N/A Primary Etiology: Skin Tear N/A N/A Comorbid History: Cataracts, Glaucoma, Optic N/A N/A Neuritis, Chronic sinus problems/congestion, Middle ear problems Date Acquired: 06/27/2019 N/A N/A Weeks of Treatment: 10 N/A N/A Wound Status: Open N/A N/A Measurements L x W  x D 2.6x3x0.2 N/A N/A (cm) Area (cm) : 6.126 N/A N/A Volume (cm) : 1.225 N/A N/A % Reduction in Area: 42.60% N/A N/A % Reduction in Volume: 77.10% N/A N/A Classification: Partial Thickness N/A N/A Exudate Amount: Medium N/A N/A Exudate Type: Serous N/A N/A Exudate Color: amber N/A N/A Wound Margin: Flat and Intact N/A N/A Granulation Amount: Medium (34-66%) N/A N/A Granulation Quality: Red N/A N/A Necrotic Amount: Medium (34-66%) N/A N/A Exposed Structures: Fat Layer (Subcutaneous N/A N/A Tissue) Exposed: Yes Fascia: No Tendon: No Muscle: No [name], [name] ([id number]) Joint: No Bone: No Epithelialization: None N/A N/A Treatment Notes Electronic Signature(s) Signed: 09/12/2019 5:01:07 PM By: Rodell Perna Entered By: Rodell Perna on 09/12/2019 11:05:06 Natasha Basset ([id number]) -------------------------------------------------------------------------------- Multi-Disciplinary Care Plan Details Patient Name: Natasha Basset Date of Service: 09/12/2019 10:45 AM Medical Record Number: [id number] Patient Account Number: [id number] Date of Birth/Sex: [birth year] (85 y.o. F) Treating RN: Rodell Perna Primary Care Frimy Uffelman: Aram Beecham Other Clinician: Referring Sonya Gunnoe: Aram Beecham Treating Abia Monaco/Extender: Linwood Dibbles, HOYT Weeks in Treatment: 10 Active Inactive Abuse / Safety / Falls /  Self Care Management Nursing Diagnoses: Potential for falls Goals: Patient will remain injury free related to falls Date Initiated: 06/28/2019 Target Resolution Date: 09/21/2019 Goal Status: Active Interventions: Assess fall risk on admission and as needed Notes: Wound/Skin Impairment Nursing Diagnoses: Impaired tissue integrity Goals: Ulcer/skin breakdown will heal within 14 weeks Date Initiated: 06/28/2019 Target Resolution Date: 09/21/2019 Goal Status: Active Interventions: Assess patient/caregiver ability to obtain necessary supplies Assess patient/caregiver ability to perform ulcer/skin care regimen upon admission and as needed Assess ulceration(s) every visit Notes: Electronic Signature(s) Signed: 09/12/2019 5:01:07 PM By: Rodell Perna Entered By: Rodell Perna on 09/12/2019 11:04:57 Natasha Basset ([id number]) -------------------------------------------------------------------------------- Pain Assessment Details Patient Name: Natasha Basset Date of Service: 09/12/2019 10:45 AM Medical Record Number: [id number] Patient Account Number: [id number] Date of Birth/Sex: November 22, 1930 (85 y.o. F) Treating RN: Rodell Perna Primary Care Inetta Dicke: Aram Beecham Other Clinician: Referring Tiffiny Worthy: Aram Beecham Treating Frona Yost/Extender: Linwood Dibbles, HOYT Weeks in Treatment: 10 Active Problems Location of Pain Severity and Description of Pain Patient Has Paino No Site Locations Pain Management and Medication Current Pain Management: Electronic Signature(s) Signed: 09/12/2019 4:27:18 PM By: Dayton Martes RCP, RRT, CHT Signed: 09/12/2019 5:01:07 PM By: Rodell Perna Entered By: Dayton Martes on 09/12/2019 10:53:21 Natasha Basset ([id number]) -------------------------------------------------------------------------------- Patient/Caregiver Education Details Patient Name: Natasha Basset Date of Service: 09/12/2019 10:45 AM Medical Record Number:  [id number] Patient Account Number: [id number] Date of Birth/Gender: 09-27-1930 (85 y.o. F) Treating RN: Rodell Perna Primary Care Physician: Aram Beecham Other Clinician: Referring Physician: Aram Beecham Treating Physician/Extender: Skeet Simmer in Treatment: 10 Education Assessment Education Provided To: Patient Education Topics Provided Wound/Skin Impairment: Handouts: Caring for Your Ulcer Methods: Demonstration, Explain/Verbal Responses: State content correctly Electronic Signature(s) Signed: 09/12/2019 5:01:07 PM By: Rodell Perna Entered By: Rodell Perna on 09/12/2019 11:07:44 Natasha Basset ([id number]) -------------------------------------------------------------------------------- Wound Assessment Details Patient Name: Natasha Basset Date of Service: 09/12/2019 10:45 AM Medical Record Number: [id number] Patient Account Number: [id number] Date of Birth/Sex: [birth year] (85 y.o. F) Treating RN: Curtis Sites Primary Care Tashauna Caisse: Aram Beecham Other Clinician: Referring Ming Kunka: Aram Beecham Treating Macintyre Alexa/Extender: Linwood Dibbles, HOYT Weeks in Treatment: 10 Wound Status Wound Number: 11 Primary Skin Tear Etiology: Wound Location: Right Lower Leg - Anterior Wound Open Wounding Event: Trauma Status: Date Acquired: 06/27/2019 Comorbid Cataracts, Glaucoma, Optic Neuritis, Chronic Weeks Of Treatment: 10 History: sinus problems/congestion, Middle ear problems  Clustered Wound: No Photos Wound Measurements Length: (cm) 2.6 % Reduction i Width: (cm) 3 % Reduction i Depth: (cm) 0.2 Epithelializa Area: (cm) 6.126 Tunneling: Volume: (cm) 1.225 Undermining: n Area: 42.6% n Volume: 77.1% tion: None No No Wound Description Classification: Partial Thickness Foul Odor Af Wound Margin: Flat and Intact Slough/Fibri Exudate Amount: Medium Exudate Type: Serous Exudate Color: amber ter Cleansing: No no Yes Wound Bed Granulation Amount: Medium  (34-66%) Exposed Structure Granulation Quality: Red Fascia Exposed: No Necrotic Amount: Medium (34-66%) Fat Layer (Subcutaneous Tissue) Exposed: Yes Necrotic Quality: Adherent Slough Tendon Exposed: No Muscle Exposed: No Joint Exposed: No Bone Exposed: No Treatment Notes [name], [name] ([id number]) Wound #11 (Right, Anterior Lower Leg) Notes prisma,gauze, conform tubi grip F Electronic Signature(s) Signed: 09/12/2019 5:10:54 PM By: Curtis Sites Entered By: Curtis Sites on 09/12/2019 10:57:46 Natasha Basset ([id number]) -------------------------------------------------------------------------------- Vitals Details Patient Name: Natasha Basset Date of Service: 09/12/2019 10:45 AM Medical Record Number: [id number] Patient Account Number: [id number] Date of Birth/Sex: September 16, 1930 (85 y.o. F) Treating RN: Rodell Perna Primary Care Yazmeen Woolf: Aram Beecham Other Clinician: Referring Damaria Stofko: Aram Beecham Treating Gladiola Madore/Extender: Linwood Dibbles, HOYT Weeks in Treatment: 10 Vital Signs Time Taken: 10:53 Temperature (F): 98.4 Height (in): 60 Pulse (bpm): 70 Weight (lbs): 125 Respiratory Rate (breaths/min): 16 Body Mass Index (BMI): 24.4 Blood Pressure (mmHg): 142/45 Reference Range: 80 - 120 mg / dl Electronic Signature(s) Signed: 09/12/2019 4:27:18 PM By: Dayton Martes RCP, RRT, CHT Entered By: Dayton Martes on 09/12/2019 10:54:29

## 2019-09-12 NOTE — Therapy (Signed)
[facility] PHYSICAL AND SPORTS MEDICINE [address], [place], [address] Phone: [phone]   Fax:  [phone]  Physical Therapy Treatment  Patient Details  Name: [name] MRN: [id number] Date of Birth: 1930/05/31 Referring Provider (PT): Cristopher Peru, MD   Encounter Date: 09/12/2019  PT End of Session - 09/12/19 1440    Visit Number  [id number]    Number of Visits  110    Date for PT Re-Evaluation  10/10/19    Authorization Type  2    Authorization Time Period  10    PT Start Time  1441   pt arrived late   PT Stop Time  1521    PT Time Calculation (min)  40 min    Activity Tolerance  Patient tolerated treatment well;Patient limited by fatigue    Behavior During Therapy  [facility] for tasks assessed/performed       Past Medical History:  Diagnosis Date  . Anemia   . GERD (gastroesophageal reflux disease)   . Hypertension   . Peripheral vascular disease (HCC)    possible neuropathies in lower extremeties  . PONV (postoperative nausea and vomiting)    happens sometimes but better with pre med of zofran  . Presence of permanent cardiac pacemaker   . Syncope     Past Surgical History:  Procedure Laterality Date  . ABDOMINAL HYSTERECTOMY  1969  . APPENDECTOMY  1969   with hysterectomy  . BACK SURGERY  2001   rods in back. surgery on back x 3  . COLONOSCOPY    . I&D EXTREMITY Left 03/27/2018   Procedure: IRRIGATION AND DEBRIDEMENT LEFT ELBOW / OLECRANON BURSA;  Surgeon: Christena Flake, MD;  Location: [facility] ORS;  Service: Orthopedics;  Laterality: Left;  . I&D EXTREMITY Left 06/05/2018   Procedure: IRRIGATION AND DEBRIDEMENT EXTREMITY;  Surgeon: Christena Flake, MD;  Location: [facility] OR[facility];  Service: Orthopedics;  Laterality: Left;  . INSERT / REPLACE / REMOVE PACEMAKER  2004  . JOINT REPLACEMENT Left 2004   partial knee replacement  . OLECRANON BURSECTOMY Left 03/27/2018   Procedure: LEFT OLECRANON BURSA;  Surgeon: Christena Flake, MD;   Location: [facility] ORS;  Service: Orthopedics;  Laterality: Left;  . PACEMAKER INSERTION  2014   dual chamber for complete heart block    There were no vitals filed for this visit.  Subjective Assessment - 09/12/19 1442    Subjective  Pt stepped up a curb today. Pt was very shaky that she could not step up onto a curb just now. A lady held the walker to help her get up.    Pertinent History  Imparied gait. Pt states that she has had balance issues for too long. Feels more comfortable walking with her rw. Had tests which showed that she has neuropathy. However the doctor at [facility] said that her balance is coming from her back. Just wants to be strong so she can walk.  Started walking with her rw off and on for the past several months.  Also feels like she might need a new sleep number bed. Also had 2 wound surgeries at [name]. Pt fell in February 2019. Had to stop PT due to the L elbow surgery April 06, 2018 to remove a bursa. The wound still has not healed.   Currently sees a wound doctor.   Feels numbness L foot from the ankle down.  No R foot numbness.  Sometimes unsure of where her  L LE is in space.  Pt also states that she tires too quickly.  Has not been able to walk her dog in 2 years. Has not really been doing exercises at home except the sit <> stand. Pt states that prolonged sitting without back support will increase back pain     Currently in Pain?  Other (Comment)                               PT Education - 09/12/19 1451    Education provided  Yes    Education Details  ther-ex    [employer]) Educated  Patient    Methods  Explanation;Demonstration;Tactile cues;Verbal cues    Comprehension  Verbalized understanding;Returned demonstration        OBJECTIVE  Pt ambulating with rw   No latex band allergies    MedbridgeAccess Code: [email or web address]   Pt has pacemaker  Wound with dressing R tibialis anterior muscle.  Therapeutic  exercise:  Static standing with feet shoulder width apart  Gentle manual perturbations all directions, emphasis on maintaining center of gravity over base of support. 2x 1 minute    Then with R foot in front 1x 1 minute  Then with L foot in front 1x 1 minute   Forward step up onto a 4 inch step with B UE assist from treadmill platform CGA, emphasis in placing body weight onto foot and more use of LE muscles to step up instead of pulling with the arms. 10x 2 each LE  SLS with light touch UE assist to promote glute med muscle strengthening and balance.  L 10x5 seconds R 10x5 seconds  Forward step up onto curb with B UE assist with rw CGA 2x each LE Able to perform with cues for weight shifting onto base of support (foot) to promote more efficient quadriceps muscle use to push herself up onto the curb. Also cues to press down on rw instead of pulling it to help her get up onto the curb safely.   Better able to step up onto curb with R LE compared to weaker L LE.   Improved exercise technique, movement at target joints, use of target muscles after mod verbal, visual, tactile cues.    Patient response to treatment: Good muscle use felt with exercises.Pt tolerated session well without aggravation of symptoms.   Clinical Impression Worked on gradual progression of ability to maintain her center of gravity over her base of support for balance and improve efficiency of LE muscle use to step up onto a higher surface. Pt initially demonstrates decreased confidence with stepping up onto a curb based on subjective report. Pt able to step up onto a curb using either her R or L LE and rw assist. Better able to step up onto curb with R LE compared to L LE. Pt tolerated session well without aggravation of symptoms. Pt will benefit from continued skilled physical therapy services to improve LE strength, and function, as well as to maintain progress and prevent regression as  evident whenever pt takes a hiatus from PT.      PT Short Term Goals - 04/25/19 1723      PT SHORT TERM GOAL #1   Title  Patient will be independent with her HEP to improve strength and balance.     Time  3    Period  Weeks    Status  On-going    Target Date  05/08/19  PT Long Term Goals - 09/03/19 1505      PT LONG TERM GOAL #1   Title  Patient will improve her 10 MWT to 0.8 m/s or more with rw to promote better community ambulation.     Baseline  0.5 seconds with rw (09/06/2018); 0.63 seconds average with rw (10/18/2018); 0.69 m/s average using rw (12/10/2018); 0.59 m/s using SPC and CGA (01/08/2019); 0.60 m/s using SPC (01/21/2019); 0.56 m/s with SPC to no AD assist, CGA (02/04/2019); 0.45 m/s average (04/17/2019); 0.53 m/s with SPC (06/06/2019); 0.26 m/s average wiht SPC (08/13/2019); unable to assess today secondary to difficulty ambulating with [facility] today (09/03/2019)    Time  8    Period  Weeks    Status  On-going      PT LONG TERM GOAL #2   Title  Patient will improve her DGI score using rw or least restrictive AD to 19/24 or more to promote balance.     Baseline  13/24 using rw (09/06/2018); 14/24 using rw (10/29/2018); 16/24 (12/10/2018); 14/24 using SPC (01/21/2019); 9/24 using [place] (04/17/2019); 14/24 (06/11/2019; unable to complete test today secondary to pt request (08/13/2019); unable to assess today secondary to difficulty ambulating with [facility] today (09/03/2019)    Time  8    Period  Weeks    Status  On-going    Target Date  10/10/19      PT LONG TERM GOAL #3   Title  Patient will improve bilateral LE strength by at least 1/2 MMT to promote ability to support herself when performing standing tasks and improve balance.     Time  8    Period  Weeks    Status  On-going    Target Date  10/10/19      PT LONG TERM GOAL #4   Title  Patient will have a decrease in back pain/ache to 5/10 or less to promote ability to perform standing tasks with less difficulty.    Baseline  8/10 at  most for the past 3 months (09/06/2018); 5-6/10 at worst for the past 7 days (10/18/2018); 5/10 at worst for the past 7 days (12/10/2018)    Time  6    Period  Weeks    Status  Achieved            Plan - 09/12/19 1440    Clinical Impression Statement  Worked on gradual progression of ability to maintain her center of gravity over her base of support for balance and improve efficiency of LE muscle use to step up onto a higher surface. Pt initially demonstrates decreased confidence with stepping up onto a curb based on subjective report. Pt able to step up onto a curb using either her R or L LE and rw assist. Better able to step up onto curb with R LE compared to L LE. Pt tolerated session well without aggravation of symptoms. Pt will benefit from continued skilled physical therapy services to improve LE strength, and function, as well as to maintain progress and prevent regression as evident whenever pt takes a hiatus from PT.    Personal Factors and Comorbidities  Age;Comorbidity 3+;Time since onset of injury/illness/exacerbation;Past/Current Experience    Comorbidities  Pacemaker, L elbow and R tibialis anterior wounds with difficulty healing, back surgery    Examination-Activity Limitations  Carry;Locomotion Level;Stairs    Stability/Clinical Decision Making  Evolving/Moderate complexity    Rehab Potential  Fair    Clinical Impairments Affecting Rehab Potential  age, fear of falling, weakness  PT Frequency  2x / week    PT Duration  8 weeks    PT Treatment/Interventions  Aquatic Therapy;Gait training;Therapeutic activities;Therapeutic exercise;Balance training;Neuromuscular re-education;Patient/family education;Manual techniques;Dry needling    PT Next Visit Plan  trunk, hip, scapular strengthening, balance, gait, manual techniques PRN    Consulted and Agree with Plan of Care  Patient       Patient will benefit from skilled therapeutic intervention in order to improve the following  deficits and impairments:  Abnormal gait, Decreased activity tolerance, Decreased balance, Decreased endurance, Decreased range of motion, Decreased strength, Difficulty walking, Improper body mechanics, Postural dysfunction, Pain, Decreased knowledge of use of DME  Visit Diagnosis: Muscle weakness (generalized)  Difficulty in walking, not elsewhere classified  History of falling  Unsteadiness on feet     Problem List Patient Active Problem List   Diagnosis Date Noted  . Septic olecranon bursitis of left elbow 03/27/2018  . Lymphedema 10/11/2017  . Chronic venous insufficiency 10/11/2017  . Leg pain 10/11/2017  . Leg swelling 10/11/2017  . COPD (chronic obstructive pulmonary disease) (HCC) 10/11/2017  . Essential hypertension 10/11/2017    Loralyn FreshwaterMiguel Alexys Lobello PT, DPT   09/12/2019, 6:47 PM  [facility] [address] [place], [place], [address] Phone: [phone]   Fax:  [phone]  Name: [name] MRN: [id number] Date of Birth: 09/17/1930

## 2019-09-13 NOTE — Progress Notes (Signed)
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 staph aureus. After some difficulty due to drug intolerances/allergies and drug interactions I settled on a 5-day course of azithromycin NAJEE, COWENS ([id number]) 12/03/18 on evaluation today patient actually appears to be doing fairly well in regard to her elbow when compared to last time I evaluated her. With that being said there does not appear to be any signs of infection at this time. That was the big concern currently as far as the patient was concerned. Nonetheless I do feel like she is making progress in regard to the feeling of this ulcer it has been slow. She did see a Engineer, petroleum they are talking about putting her in a brace in order to allow this area to  heal more appropriately. 12/19/2018; not much change in this from the last time I have saw this.'s much smaller area than when she first came in and with less circumferential undermining however this is never really adhered. She is wearing the brace that was given or prescribed to her by plastics. She did not have a procedure offered to attempt to close this. We have been using endoform 1/15; wound actually is not doing as well as last week. She was actually not supposed to come into this clinic again until next week but apparently her attendant noticed some redness increasing pain and she came in early. She reports the same amount of drainage. We have been using endoform. She is approved through puraply however I will only consider starting that next week 1/22; she completed the antibiotics last week. Culture I did was negative. In spite of this there is less erythema and pain complaints in the wound. Puraply #1 applied today 1/29; Puraply #2 today. Wound surface looks a lot better post debridement of adherent fibrinous material. However undermining from 6-12 is measuring worse 2/5; Puraply #3. Using her elbow brace 2/12 puraply #4 2/19 puraply #5. The 9:00 undermining measured at 0.5 cm. Undermining from 4-11 o'clock. Surface of the wound looks better and the circumference of the wound is smaller however the undermining is not really changed 2/26; still not much improvement. She has undermining from 4-9 o'clock 0.9 cm. Surface of the wound covered and adherent debris. 3/4; still no improvement. Undermining from 4-9 o'clock still around a centimeter. Surface of the wound looks somewhat better. No debridement is required we used endoform after we ended the trial of puraply last week 3/11; really no improvement at all. Still 1 cm undermining from roughly 9-3 o'clock. This is about a centimeter. The base of the wound looks fairly healthy. No debridement. We have been using endoform. I am really out  of most usual options here. I could consider either another round of an amniotic advanced treatment product example epifix or perhaps regranex. Understandably the patient is a bit frustrated. We did send her to plastic surgery for a consult. Other than prescribing her a brace to immobilize the elbow they did not think she was a candidate for any further surgery. Notable that the patient is not using the brace today 3/18-Patient returns for attention to the left elbow area which apparently looked red at the home health visit. Patient's elbow looks the same if not better compared with last visit. The area of ulceration remains the same, the base appears healthy. We are continuing to use endoform she has been encouraged to use the brace to keep the elbow straight 3/25; the patient has an appointment at the [facility] wound care center on 4/2. I had actually put her out indefinitely however she seems to want to come back here every week. This week she complains of increased pain and malodor. Dimensions of the elbow wound are larger. We have been using endoform 4/8; the patient went to [facility] where they apparently gave her meta honey and some border foam with a Tubigrip. She has been using this for a week. She says she was very impressed with them there. They did not offer her any surgical consultation. She seems to be coming back here for a second opinion on this, she does not wish to drive to [facility] every week 1/61; still using Medihoney foam border and a Tubigrip. Actually do not think she has anything on the arm at all specifically she is not using her brace 5/6; she has been using medihoney without a lot of improvement. Undermining maximum at 9:00 at 0.5 cm may be somewhat better. She is still complaining of discomfort. She uses her elbow brace at night but is not using anything on the arm during the day, she finds it too restrictive 5/13; we switch the patient to endoform AG last week. She is complaining  of more pain and worried about some circumferential erythema around the wound. I had planned to consider epifix in this wound however the patient came in with a request to see a plastic surgeon in [place] by the name of Dr. Wyline Mood who cared for a friend of hers. Noteworthy that I have already sent her to one plastic surgeon and she went for another second opinion at [facility] and they apparently did not send her to a plastic surgeon nevertheless the patient is fairly convinced that she might benefit from a skin graft which I am doubtful. She also has rheumatoid arthritis and is on methotrexate. This was originally a surgical wound for a bursectomy a year or 2 ago Readmission: 06/28/19 on evaluation today patient presents today for reevaluation but this is due to a new issue her elbow has completely healed and looks excellent. Her right anterior lower leg has a skin tear which was sustained from her dog who jumped up on her and inadvertently scratch the area causing the skin tear. This happened yesterday. She is having some discomfort but fortunately nothing too significant which is good news. No fevers, chills, nausea, or vomiting noted at this time. The skin fortunately was knocked one completely off and we are gonna see about we approximate in the skin as best we can in using Steri-Strips to hold this in place obviously if we can get some of this to reattach that would be beneficial [name], [name] ([id number]) 07/05/2019 on evaluation  today patient actually appears to be doing okay although unfortunately the skin flap that we were attempting to Steri-Stripped down last week did not take. She is developing a lot of fluid underneath the wound area unfortunately which again is not ideal. I think that this necrotic tissue needs to be removed and again it actually just wiped off during the evaluation today as I was attempting to clean the wound there did not appear to be any significant  issues underlying which is good although there was some purulent drainage I did want to go ahead and see about obtaining a culture from today in order to ensure that the Z-Pak that I placed her on earlier in the week was appropriate for treating what ever infection may be causing the issue currently. This was a deep wound culture obtained today. 07/12/2019 on evaluation today patient actually appears to be doing quite well with regard to her right lower extremity ulcer all things considering. There is still little bit of hematoma not it that is noted in the central portion of the wound along with some necrotic tissue but she is still having quite a bit of discomfort. For that reason I did not perform sharp debridement today although this wound does need some debridement of one type or another. I think we may attempt Iodoflex to see if this can be of benefit. Fortunately there is no signs of active infection at this point. 07/18/19 upon evaluation today patient appears to be doing better with regard to her ulcer on her right lower extremity. She's been tolerating the dressing changes without complication. The good news is she seems to be making excellent progress. Overall I'm pleased with there being no signs of infection. With that being said she does tell me that she's been having some discomfort with the wrap she's unsure of exactly what about the rafters causing her trouble. 07/25/2019 on evaluation today patient appears to be doing a little better in regard to her lower extremity ulcer. Unfortunately the alginate seems to be getting really stuck to the wound bed and surrounding periwound. Fortunately there is no signs of active infection at this time. There does not appear to be any evidence of infection currently. 08/01/2019 on evaluation today patient actually appears to be doing much better with regard to her right lower extremity ulcer. She has been tolerating the dressing changes without  complication. Fortunately her wound does not show any signs of infection and seems to be making good progress. No fevers, chills, nausea, vomiting, or diarrhea. 08/08/2019 on evaluation today patient actually appears to be doing better with regard to her leg ulcer. She has been tolerating the dressing changes without complication. With that being said I think we may want to switch the dressing up today just based on the appearance of the wound bed in general. Fortunately there is no evidence of infection currently. Her pain seems to be much better. Her son is present with her today. 08/15/2019 on evaluation today patient appears to be doing very well with regard to her right lower extremity ulcer. She still complains about feeling like the compression stocking/Tubigrip is too tight for her. Nonetheless I still think this is beneficial and is helping the wound to heal more effectively. Overall I am extremely pleased at this time with what I am seeing. 08/22/2019 on evaluation today patient actually appears to be doing quite well with regard to her right lower extremity. She has been tolerating the dressing changes without complication. Fortunately there  is no signs of active infection at this time. She still complains about the Tubigrip but nonetheless I think this is something that is of utmost importance for her to continue to wear if she is going to see this area heal appropriately. 08/29/2019 on evaluation today patient actually appears to be doing well visually in regard to her wound. Unfortunately in regard to overall pain she seems to be having more pain today which I am somewhat concerned about. There does not appear to be any signs of active infection that I can tell but again with increased pain that is definitely a concern here today. I do think it may be time to switch up the dressing as well to something that will be a little bit more effective hopefully and new tissue growth she has done  well with collagen in the past. 09/05/2019 on evaluation today patient actually appears to be doing well with regard to her leg ulcer. She has been tolerating the dressing changes without complication. Fortunately there is no signs of active infection. We did obtain a wound culture last week due to the fact that she still is very touchy as far as the surface of the wound is concerned. Unfortunately I am not sure what happened to that culture as there is no record in the computer system. Nonetheless we need to follow-up on this and try to see if we can identify what exactly happened with this specimen as we have not gotten a result back either obviously. 09/12/2019 on evaluation today patient actually appears to be showing signs of good improvement upon evaluation today. She states her pain is not nearly as bad as what it was which is also good news. She did have a wound culture which was reviewed today that showed no growth of bacteria which is good news. This coupled with the fact that her wound appears to be healing better, measuring smaller, and overall even the slough buildup is minimal compared to what we have seen previously, I feel like she is actually showing signs of excellent improvement today. [name], [name] ([id number]) Patient History Information obtained from Patient. Family History Heart Disease - Siblings,Father, No family history of Cancer, Diabetes, Hereditary Spherocytosis, Hypertension, Kidney Disease, Lung Disease, Seizures, Stroke, Thyroid Problems, Tuberculosis. Social History Never smoker, Alcohol Use - Daily, Drug Use - No History, Caffeine Use - Never. Medical History Eyes Patient has history of Cataracts, Glaucoma, Optic Neuritis Ear/Nose/Mouth/Throat Patient has history of Chronic sinus problems/congestion, Middle ear problems Medical And Surgical History Notes Constitutional Symptoms (General Health) Back pain Ear/Nose/Mouth/Throat bilateral hearing  aides Review of Systems (ROS) Constitutional Symptoms (General Health) Denies complaints or symptoms of Fatigue, Fever, Chills, Marked Weight Change. Respiratory Denies complaints or symptoms of Chronic or frequent coughs, Shortness of Breath. Cardiovascular Denies complaints or symptoms of Chest pain, LE edema. Psychiatric Denies complaints or symptoms of Anxiety, Claustrophobia. Objective Constitutional Well-nourished and well-hydrated in no acute distress. Vitals Time Taken: 10:53 AM, Height: 60 in, Weight: 125 lbs, BMI: 24.4, Temperature: 98.4 F, Pulse: 70 bpm, Respiratory Rate: 16 breaths/min, Blood Pressure: 142/45 mmHg. Respiratory normal breathing without difficulty. clear to auscultation bilaterally. Cardiovascular regular rate and rhythm with normal S1, S2. Psychiatric [name], [name] ([id number]) this patient is able to make decisions and demonstrates good insight into disease process. Alert and Oriented x 3. pleasant and cooperative. General Notes: Patient's wound VAC currently showed signs of good granulation at this time there was some minimal slough noted which did require mechanical debridement  I was able to clean this away without complication post debridement wound bed appears to be doing much better which is great news. No sharp debridement was required today. Integumentary (Hair, Skin) Wound #11 status is Open. Original cause of wound was Trauma. The wound is located on the Right,Anterior Lower Leg. The wound measures 2.6cm length x 3cm width x 0.2cm depth; 6.126cm^2 area and 1.225cm^3 volume. There is Fat Layer (Subcutaneous Tissue) Exposed exposed. There is no tunneling or undermining noted. There is a medium amount of serous drainage noted. The wound margin is flat and intact. There is medium (34-66%) red granulation within the wound bed. There is a medium (34-66%) amount of necrotic tissue within the wound bed including Adherent Slough. Assessment Active  Problems ICD-10 Unspecified open wound, right lower leg, initial encounter Non-pressure chronic ulcer of other part of right lower leg with fat layer exposed Struck by dog, initial encounter Plan Wound Cleansing: Wound #11 Right,Anterior Lower Leg: Clean wound with Normal Saline. Primary Wound Dressing: Wound #11 Right,Anterior Lower Leg: Silver Collagen Secondary Dressing: Wound #11 Right,Anterior Lower Leg: Dry Gauze Conform/Kerlix Dressing Change Frequency: Wound #11 Right,Anterior Lower Leg: Change dressing every other day. Follow-up Appointments: Wound #11 Right,Anterior Lower Leg: Return Appointment in 1 week. Edema Control: Wound #11 Right,Anterior Lower Leg: Other: - tubi grip [name], [name] ([id number]) 1. I am in a suggest currently that we continue with the silver collagen dressing as that seems to be helpful for the patient. 2. I do not need to initiate any antibiotics at this time since the culture was negative and there does not appear to be any signs that anything is worsening at this time and there is no evidence of infection. 3. I am good recommend as well that we continue with the Tubigrip as I do feel like that is helping her she does not really like this but nonetheless it is beneficial. We will see patient back for reevaluation in 1 week here in the clinic. If anything worsens or changes patient will contact our office for additional recommendations. Electronic Signature(s) Signed: 09/12/2019 3:24:34 PM By: Worthy Keeler PA-C Entered By: Worthy Keeler on 09/12/2019 15:24:34 [name] ([id number]) -------------------------------------------------------------------------------- ROS/PFSH Details Patient Name: [name] Date of Service: 09/12/2019 10:45 AM Medical Record Number: [id number] Patient Account Number: [id number] Date of Birth/Sex: 08-Jul-1930 (85 y.o. F) Treating RN: Army Melia Primary Care Provider: Fulton Reek Other  Clinician: Referring Provider: Fulton Reek Treating Provider/Extender: Melburn Hake, HOYT Weeks in Treatment: 10 Information Obtained From Patient Constitutional Symptoms (General Health) Complaints and Symptoms: Negative for: Fatigue; Fever; Chills; Marked Weight Change Medical History: Past Medical History Notes: Back pain Respiratory Complaints and Symptoms: Negative for: Chronic or frequent coughs; Shortness of Breath Cardiovascular Complaints and Symptoms: Negative for: Chest pain; LE edema Psychiatric Complaints and Symptoms: Negative for: Anxiety; Claustrophobia Eyes Medical History: Positive for: Cataracts; Glaucoma; Optic Neuritis Ear/Nose/Mouth/Throat Medical History: Positive for: Chronic sinus problems/congestion; Middle ear problems Past Medical History Notes: bilateral hearing aides HBO Extended History Items Ear/Nose/Mouth/Throat: Eyes: Eyes: Ear/Nose/Mouth/Throat: Chronic sinus Cataracts Glaucoma Middle ear problems problems/congestion Immunizations Pneumococcal Vaccine: Received Pneumococcal Vaccination: No Implantable Devices [name], [name] ([id number]) Yes Family and Social History Cancer: No; Diabetes: No; Heart Disease: Yes - Siblings,Father; Hereditary Spherocytosis: No; Hypertension: No; Kidney Disease: No; Lung Disease: No; Seizures: No; Stroke: No; Thyroid Problems: No; Tuberculosis: No; Never smoker; Alcohol Use: Daily; Drug Use: No History; Caffeine Use: Never; Financial Concerns: No; Food, Clothing or Shelter Needs:  No; Support System Lacking: No; Transportation Concerns: No Physician Affirmation I have reviewed and agree with the above information. Electronic Signature(s) Signed: 09/12/2019 5:01:07 PM By: Rodell Perna Signed: 09/12/2019 5:50:00 PM By: Lenda Kelp PA-C Entered By: Lenda Kelp on 09/12/2019 15:23:49 Natasha Basset  ([id number]) -------------------------------------------------------------------------------- SuperBill Details Patient Name: Natasha Basset Date of Service: 09/12/2019 Medical Record Number: [id number] Patient Account Number: [id number] Date of Birth/Sex: 1930/06/18 (85 y.o. F) Treating RN: Rodell Perna Primary Care Provider: Aram Beecham Other Clinician: Referring Provider: Aram Beecham Treating Provider/Extender: Linwood Dibbles, HOYT Weeks in Treatment: 10 Diagnosis Coding ICD-10 Codes Code Description S81.801A Unspecified open wound, right lower leg, initial encounter L97.812 Non-pressure chronic ulcer of other part of right lower leg with fat layer exposed W54.1XXA Struck by dog, initial encounter Facility Procedures CPT4 Code: [id number] Description: [phone] - WOUND CARE VISIT-LEV 3 EST PT Modifier: Quantity: 1 Physician Procedures CPT4 Code Description: [id number] - WC PHYS LEVEL 4 - EST PT ICD-10 Diagnosis Description S81.801A Unspecified open wound, right lower leg, initial encounter L97.812 Non-pressure chronic ulcer of other part of right lower leg wi W54.1XXA Struck by  dog, initial encounter Modifier: th fat layer expo Quantity: 1 sed Electronic Signature(s) Signed: 09/12/2019 3:24:51 PM By: Lenda Kelp PA-C Entered By: Lenda Kelp on 09/12/2019 15:24:50

## 2019-09-17 ENCOUNTER — Ambulatory Visit: Payer: Medicare Other

## 2019-09-17 ENCOUNTER — Other Ambulatory Visit: Payer: Self-pay

## 2019-09-17 DIAGNOSIS — R262 Difficulty in walking, not elsewhere classified: Secondary | ICD-10-CM

## 2019-09-17 DIAGNOSIS — M6281 Muscle weakness (generalized): Secondary | ICD-10-CM | POA: Diagnosis not present

## 2019-09-17 DIAGNOSIS — R2681 Unsteadiness on feet: Secondary | ICD-10-CM

## 2019-09-17 DIAGNOSIS — Z9181 History of falling: Secondary | ICD-10-CM

## 2019-09-17 NOTE — Therapy (Signed)
[facility] [address] [place], [address] Phone: [phone]   Fax:  [phone]  Physical Therapy Treatment  Patient Details  Name: [name] MRN: [id number] Date of Birth: 31-Jul-1930 Referring Provider (PT): Jennings Books, MD   Encounter Date: 09/17/2019  PT End of Session - 09/17/19 1306    Visit Number  65    Number of Visits  110    Date for PT Re-Evaluation  10/10/19    Authorization Type  3    Authorization Time Period  10    PT Start Time  1307    PT Stop Time  1351    PT Time Calculation (min)  44 min    Activity Tolerance  Patient tolerated treatment well;Patient limited by fatigue    Behavior During Therapy  [facility] for tasks assessed/performed       Past Medical History:  Diagnosis Date  . Anemia   . GERD (gastroesophageal reflux disease)   . Hypertension   . Peripheral vascular disease (HCC)    possible neuropathies in lower extremeties  . PONV (postoperative nausea and vomiting)    happens sometimes but better with pre med of zofran  . Presence of permanent cardiac pacemaker   . Syncope     Past Surgical History:  Procedure Laterality Date  . ABDOMINAL HYSTERECTOMY  1969  . APPENDECTOMY  1969   with hysterectomy  . BACK SURGERY  2001   rods in back. surgery on back x 3  . COLONOSCOPY    . I&D EXTREMITY Left 03/27/2018   Procedure: IRRIGATION AND DEBRIDEMENT LEFT ELBOW / OLECRANON BURSA;  Surgeon: Corky Mull, MD;  Location: [facility] ORS;  Service: Orthopedics;  Laterality: Left;  . I&D EXTREMITY Left 06/05/2018   Procedure: IRRIGATION AND DEBRIDEMENT EXTREMITY;  Surgeon: Corky Mull, MD;  Location: [facility] ORS;  Service: Orthopedics;  Laterality: Left;  . INSERT / REPLACE / REMOVE PACEMAKER  2004  . JOINT REPLACEMENT Left 2004   partial knee replacement  . OLECRANON BURSECTOMY Left 03/27/2018   Procedure: LEFT OLECRANON BURSA;  Surgeon: Corky Mull, MD;  Location: [facility] ORS;   Service: Orthopedics;  Laterality: Left;  . PACEMAKER INSERTION  2014   dual chamber for complete heart block    There were no vitals filed for this visit.  Subjective Assessment - 09/17/19 1309    Subjective  Pt did not have time to eat lunch today. R leg is so much better. Still has issues with L elbow.    Pertinent History  Imparied gait. Pt states that she has had balance issues for too long. Feels more comfortable walking with her rw. Had tests which showed that she has neuropathy. However the doctor at [facility] said that her balance is coming from her back. Just wants to be strong so she can walk.  Started walking with her rw off and on for the past several months.  Also feels like she might need a new sleep number bed. Also had 2 wound surgeries at [name]. Pt fell in February 2019. Had to stop PT due to the L elbow surgery April 06, 2018 to remove a bursa. The wound still has not healed.   Currently sees a wound doctor.   Feels numbness L foot from the ankle down.  No R foot numbness.  Sometimes unsure of where her L LE is in space.  Pt also states that she tires too quickly.  Has not been able to walk her dog in 2 years. Has not really been doing exercises at home except the sit <> stand. Pt states that prolonged sitting without back support will increase back pain     Currently in Pain?  No/denies                               PT Education - 09/17/19 1310    Education provided  Yes    Education Details  ther-ex    Starwood HotelsPerson(s) Educated  Patient    Methods  Explanation;Demonstration;Tactile cues;Verbal cues    Comprehension  Verbalized understanding;Returned demonstration         OBJECTIVE  Pt ambulating with rw   No latex band allergies    MedbridgeAccess Code: [email or web address]   Pt has pacemaker  Wound with dressing R tibialis anterior muscle.  Therapeutic exercise:    Forward step up onto a 4 inch step with B UE assist from  treadmill platform CGA, emphasis in placing body weight onto foot and more use of LE muscles to step up instead of pulling with the arms. 10x 2 each LE  Forward step up onto curb using rw  R 1x. Able to perform well  L 6x. Improved ability to forward weight shift and perform properly after cues and practice  Improved exercise technique, movement at target joints, use of target muscles after mod verbal, visual, tactile cues.       Gait training   Gait with PT hand held assist 150 ft   Cues to increase step length.    Gait with R hand use on rw only, CGA 100 ft to prepare [facility] for [facility] use.   Slight unsteadiness  Gait with SPC 50 ft x 50 ft , CGA  Slight unsteadiness. Cues for increased step length. Improved confidence with gait with SPC after practice. Improved step length and ability to ambulate with SPC during 2nd 50 ft.    Improved technique, movement at target joints, use of target muscles after mod verbal, visual, tactile cues.          Patient response to treatment: Good muscle use felt with exercises.Pt tolerated session well without aggravation of symptoms.   Clinical Impression Continued working on improving LE strength to step up onto a curb to promote ability to get to where she needs to go after she gets out of the car. Able to step up onto a curb with use of rw. Better able to do so with her R LE compared to L LE secondary to weakness.  Continued working on ambulating with least restrictive AD, working [facility]. Practiced gait with only R UE assist with decreasing level of assist to promote balance and decrease fall risk. Still demonstrates difficulty ambulating with SPC but better ability to do so with improved balance confidence and step length observed with practice. Pt will benefit from continued skilled physical therapy services to continue progress with functional LE strength, improve balance confidence to decrease fall risk, and improve ability to ambulate  with less need for AD.  Skilled PT services currently essential based on functional decline observed whenever pt takes a hiatus from PT.       PT Short Term Goals - 04/25/19 1723      PT SHORT TERM GOAL #1   Title  Patient will be independent with her HEP to improve strength and balance.     Time  3  Period  Weeks    Status  On-going    Target Date  05/08/19        PT Long Term Goals - 09/03/19 1505      PT LONG TERM GOAL #1   Title  Patient will improve her 10 MWT to 0.8 m/s or more with rw to promote better community ambulation.     Baseline  0.5 seconds with rw (09/06/2018); 0.63 seconds average with rw (10/18/2018); 0.69 m/s average using rw (12/10/2018); 0.59 m/s using SPC and CGA (01/08/2019); 0.60 m/s using SPC (01/21/2019); 0.56 m/s with SPC to no AD assist, CGA (02/04/2019); 0.45 m/s average (04/17/2019); 0.53 m/s with SPC (06/06/2019); 0.26 m/s average wiht SPC (08/13/2019); unable to assess today secondary to difficulty ambulating with [facility] today (09/03/2019)    Time  8    Period  Weeks    Status  On-going      PT LONG TERM GOAL #2   Title  Patient will improve her DGI score using rw or least restrictive AD to 19/24 or more to promote balance.     Baseline  13/24 using rw (09/06/2018); 14/24 using rw (10/29/2018); 16/24 (12/10/2018); 14/24 using SPC (01/21/2019); 9/24 using SPC (04/17/2019); 14/24 (06/11/2019; unable to complete test today secondary to pt request (08/13/2019); unable to assess today secondary to difficulty ambulating with [facility] today (09/03/2019)    Time  8    Period  Weeks    Status  On-going    Target Date  10/10/19      PT LONG TERM GOAL #3   Title  Patient will improve bilateral LE strength by at least 1/2 MMT to promote ability to support herself when performing standing tasks and improve balance.     Time  8    Period  Weeks    Status  On-going    Target Date  10/10/19      PT LONG TERM GOAL #4   Title  Patient will have a decrease in back pain/ache to 5/10 or  less to promote ability to perform standing tasks with less difficulty.    Baseline  8/10 at most for the past 3 months (09/06/2018); 5-6/10 at worst for the past 7 days (10/18/2018); 5/10 at worst for the past 7 days (12/10/2018)    Time  6    Period  Weeks    Status  Achieved            Plan - 09/17/19 1344    Clinical Impression Statement  Continued working on improving LE strength to step up onto a curb to promote ability to get to where she needs to go after she gets out of the car. Able to step up onto a curb with use of rw. Better able to do so with her R LE compared to L LE secondary to weakness.  Continued working on ambulating with least restrictive AD, working towards [facility]. Practiced gait with only R UE assist with decreasing level of assist to promote balance and decrease fall risk. Still demonstrates difficulty ambulating with SPC but better ability to do so with improved balance confidence and step length observed with practice. Pt will benefit from continued skilled physical therapy services to continue progress with functional LE strength, improve balance confidence to decrease fall risk, and improve ability to ambulate with less need for AD.  Skilled PT services currently essential based on functional decline observed whenever pt takes a hiatus from PT.    Personal Factors and Comorbidities  Age;Comorbidity 3+;Time since onset of injury/illness/exacerbation;Past/Current Experience    Comorbidities  Pacemaker, L elbow and R tibialis anterior wounds with difficulty healing, back surgery    Examination-Activity Limitations  Carry;Locomotion Level;Stairs    Stability/Clinical Decision Making  Evolving/Moderate complexity    Rehab Potential  Fair    Clinical Impairments Affecting Rehab Potential  age, fear of falling, weakness    PT Frequency  2x / week    PT Duration  8 weeks    PT Treatment/Interventions  Aquatic Therapy;Gait training;Therapeutic activities;Therapeutic  exercise;Balance training;Neuromuscular re-education;Patient/family education;Manual techniques;Dry needling    PT Next Visit Plan  trunk, hip, scapular strengthening, balance, gait, manual techniques PRN    Consulted and Agree with Plan of Care  Patient       Patient will benefit from skilled therapeutic intervention in order to improve the following deficits and impairments:  Abnormal gait, Decreased activity tolerance, Decreased balance, Decreased endurance, Decreased range of motion, Decreased strength, Difficulty walking, Improper body mechanics, Postural dysfunction, Pain, Decreased knowledge of use of DME  Visit Diagnosis: Muscle weakness (generalized)  Difficulty in walking, not elsewhere classified  History of falling  Unsteadiness on feet     Problem List Patient Active Problem List   Diagnosis Date Noted  . Septic olecranon bursitis of left elbow 03/27/2018  . Lymphedema 10/11/2017  . Chronic venous insufficiency 10/11/2017  . Leg pain 10/11/2017  . Leg swelling 10/11/2017  . COPD (chronic obstructive pulmonary disease) (HCC) 10/11/2017  . Essential hypertension 10/11/2017   Loralyn FreshwaterMiguel  PT, DPT   09/17/2019, 2:09 PM  [facility] [address] [place], [place], [address] Phone: [phone]   Fax:  [phone]  Name: [name] MRN: [id number] Date of Birth: 08/12/1930

## 2019-09-19 ENCOUNTER — Other Ambulatory Visit: Payer: Self-pay

## 2019-09-19 ENCOUNTER — Encounter: Payer: Medicare Other | Admitting: Physician Assistant

## 2019-09-19 ENCOUNTER — Ambulatory Visit: Payer: Medicare Other

## 2019-09-19 DIAGNOSIS — L97819 Non-pressure chronic ulcer of other part of right lower leg with unspecified severity: Secondary | ICD-10-CM | POA: Diagnosis not present

## 2019-09-19 NOTE — Progress Notes (Addendum)
NAKHIA, HAMBURGER ([id number]) Visit Report for 09/19/2019 Arrival Information Details Patient Name: [name], [name]. Date of Service: 09/19/2019 1:00 PM Medical Record Number: [id number] Patient Account Number: [id number] Date of Birth/Sex: [birth year] (85 y.o. F) Treating RN: Rodell Perna Primary Care Camora Tremain: Aram Beecham Other Clinician: Referring Lenorris Karger: Aram Beecham Treating Davied Nocito/Extender: Linwood Dibbles, HOYT Weeks in Treatment: 11 Visit Information History Since Last Visit Added or deleted any medications: No Patient Arrived: Walker Any new allergies or adverse reactions: No Arrival Time: 13:08 Had a fall or experienced change in No Accompanied By: self activities of daily living that may affect Transfer Assistance: None risk of falls: Patient Identification Verified: Yes Signs or symptoms of abuse/neglect since last visito No Secondary Verification Process Completed: Yes Hospitalized since last visit: No Patient Requires Transmission-Based Precautions: No Implantable device outside of the clinic excluding No Patient Has Alerts: No cellular tissue based products placed in the center since last visit: Has Dressing in Place as Prescribed: Yes Has Compression in Place as Prescribed: Yes Pain Present Now: Yes Electronic Signature(s) Signed: 09/19/2019 4:27:49 PM By: Dayton Martes RCP, RRT, CHT Entered By: Dayton Martes on 09/19/2019 13:13:32 [name] ([id number]) -------------------------------------------------------------------------------- Encounter Discharge Information Details Patient Name: [name] Date of Service: 09/19/2019 1:00 PM Medical Record Number: [id number] Patient Account Number: [id number] Date of Birth/Sex: 1930/01/27 (85 y.o. F) Treating RN: Rodell Perna Primary Care Romaldo Saville: Aram Beecham Other Clinician: Referring Sheryle Vice: Aram Beecham Treating Araceli Coufal/Extender: Linwood Dibbles, HOYT Weeks in  Treatment: 11 Encounter Discharge Information Items Post Procedure Vitals Discharge Condition: Stable Temperature (F): 98.4 Ambulatory Status: Walker Pulse (bpm): 66 Discharge Destination: Home Respiratory Rate (breaths/min): 16 Transportation: Private Auto Blood Pressure (mmHg): 143/62 Accompanied By: self Schedule Follow-up Appointment: Yes Clinical Summary of Care: Electronic Signature(s) Signed: 09/30/2019 8:11:06 AM By: Rodell Perna Entered By: Rodell Perna on 09/19/2019 13:49:57 [name] ([id number]) -------------------------------------------------------------------------------- Lower Extremity Assessment Details Patient Name: [name] Date of Service: 09/19/2019 1:00 PM Medical Record Number: [id number] Patient Account Number: [id number] Date of Birth/Sex: 05-02-1930 (85 y.o. F) Treating RN: Curtis Sites Primary Care Janiah Devinney: Aram Beecham Other Clinician: Referring Shreeya Recendiz: Aram Beecham Treating Jossilyn Benda/Extender: Linwood Dibbles, HOYT Weeks in Treatment: 11 Edema Assessment Assessed: [Left: No] [Right: No] Edema: [Left: Ye] [Right: s] Calf Left: Right: Point of Measurement: 30 cm From Medial Instep cm 34.2 cm Ankle Left: Right: Point of Measurement: 10 cm From Medial Instep cm 23.2 cm Vascular Assessment Pulses: Dorsalis Pedis Palpable: [Right:Yes] Electronic Signature(s) Signed: 09/19/2019 4:46:01 PM By: Curtis Sites Entered By: Curtis Sites on 09/19/2019 13:21:38 [name] ([id number]) -------------------------------------------------------------------------------- Multi Wound Chart Details Patient Name: [name] Date of Service: 09/19/2019 1:00 PM Medical Record Number: [id number] Patient Account Number: [id number] Date of Birth/Sex: 02/11/1930 (85 y.o. F) Treating RN: Rodell Perna Primary Care Shantina Chronister: Aram Beecham Other Clinician: Referring Isaura Schiller: Aram Beecham Treating Aunica Dauphinee/Extender: Linwood Dibbles,  HOYT Weeks in Treatment: 11 Vital Signs Height(in): 60 Pulse(bpm): 66 Weight(lbs): 125 Blood Pressure(mmHg): 143/62 Body Mass Index(BMI): 24 Temperature(F): 98.4 Respiratory Rate 16 (breaths/min): Photos: [N/A:N/A] Wound Location: Right Lower Leg - Anterior N/A N/A Wounding Event: Trauma N/A N/A Primary Etiology: Skin Tear N/A N/A Comorbid History: Cataracts, Glaucoma, Optic N/A N/A Neuritis, Chronic sinus problems/congestion, Middle ear problems Date Acquired: 06/27/2019 N/A N/A Weeks of Treatment: 11 N/A N/A Wound Status: Open N/A N/A Measurements L x W x D 2.5x2.4x0.2 N/A N/A (cm) Area (cm) : 4.712 N/A N/A Volume (cm) : 0.942 N/A N/A % Reduction in  Area: 55.90% N/A N/A % Reduction in Volume: 82.40% N/A N/A Classification: Full Thickness Without N/A N/A Exposed Support Structures Exudate Amount: Medium N/A N/A Exudate Type: Serous N/A N/A Exudate Color: amber N/A N/A Wound Margin: Flat and Intact N/A N/A Granulation Amount: Medium (34-66%) N/A N/A Granulation Quality: Red N/A N/A Necrotic Amount: Medium (34-66%) N/A N/A Exposed Structures: Fat Layer (Subcutaneous N/A N/A Tissue) Exposed: Yes Fascia: No Tendon: No [name], [name] ([id number]) Muscle: No Joint: No Bone: No Epithelialization: None N/A N/A Treatment Notes Electronic Signature(s) Signed: 09/30/2019 8:11:06 AM By: Army Melia Entered By: Army Melia on 09/19/2019 13:42:39 [name] ([id number]) -------------------------------------------------------------------------------- [place] Details Patient Name: [name] Date of Service: 09/19/2019 1:00 PM Medical Record Number: [id number] Patient Account Number: [id number] Date of Birth/Sex: 1930-01-15 (85 y.o. F) Treating RN: Army Melia Primary Care Dimonique Bourdeau: Fulton Reek Other Clinician: Referring Jorje Vanatta: Fulton Reek Treating Natasha Syme/Extender: Melburn Hake, HOYT Weeks in Treatment: 11 Active  Inactive Abuse / Safety / Falls / Self Care Management Nursing Diagnoses: Potential for falls Goals: Patient will remain injury free related to falls Date Initiated: 06/28/2019 Target Resolution Date: 09/21/2019 Goal Status: Active Interventions: Assess fall risk on admission and as needed Notes: Wound/Skin Impairment Nursing Diagnoses: Impaired tissue integrity Goals: Ulcer/skin breakdown will heal within 14 weeks Date Initiated: 06/28/2019 Target Resolution Date: 09/21/2019 Goal Status: Active Interventions: Assess patient/caregiver ability to obtain necessary supplies Assess patient/caregiver ability to perform ulcer/skin care regimen upon admission and as needed Assess ulceration(s) every visit Notes: Electronic Signature(s) Signed: 09/30/2019 8:11:06 AM By: Army Melia Entered By: Army Melia on 09/19/2019 13:42:28 [name] ([id number]) -------------------------------------------------------------------------------- Pain Assessment Details Patient Name: [name] Date of Service: 09/19/2019 1:00 PM Medical Record Number: [id number] Patient Account Number: [id number] Date of Birth/Sex: 01/02/1930 (85 y.o. F) Treating RN: Montey Hora Primary Care Hellen Shanley: Fulton Reek Other Clinician: Referring Macie Baum: Fulton Reek Treating Adryana Mogensen/Extender: Melburn Hake, HOYT Weeks in Treatment: 11 Active Problems Location of Pain Severity and Description of Pain Patient Has Paino Yes Site Locations Pain Location: Pain in Ulcers With Dressing Change: Yes Duration of the Pain. Constant / Intermittento Intermittent Pain Management and Medication Current Pain Management: Notes feels "touchy" Electronic Signature(s) Signed: 09/19/2019 4:46:01 PM By: Montey Hora Entered By: Montey Hora on 09/19/2019 13:14:14 [name] ([id number]) -------------------------------------------------------------------------------- Patient/Caregiver Education  Details Patient Name: [name] Date of Service: 09/19/2019 1:00 PM Medical Record Number: [id number] Patient Account Number: [id number] Date of Birth/Gender: Apr 28, 1930 (85 y.o. F) Treating RN: Army Melia Primary Care Physician: Fulton Reek Other Clinician: Referring Physician: Fulton Reek Treating Physician/Extender: Sharalyn Ink in Treatment: 11 Education Assessment Education Provided To: Patient Education Topics Provided Wound/Skin Impairment: Handouts: Caring for Your Ulcer Methods: Demonstration, Explain/Verbal Responses: State content correctly Electronic Signature(s) Signed: 09/30/2019 8:11:06 AM By: Army Melia Entered By: Army Melia on 09/19/2019 13:49:01 [name] ([id number]) -------------------------------------------------------------------------------- Wound Assessment Details Patient Name: [name] Date of Service: 09/19/2019 1:00 PM Medical Record Number: [id number] Patient Account Number: [id number] Date of Birth/Sex: December 01, 1930 (85 y.o. F) Treating RN: Montey Hora Primary Care Deeandra Jerry: Fulton Reek Other Clinician: Referring Solomia Harrell: Fulton Reek Treating Luciann Gossett/Extender: Melburn Hake, HOYT Weeks in Treatment: 11 Wound Status Wound Number: 11 Primary Skin Tear Etiology: Wound Location: Right Lower Leg - Anterior Wound Open Wounding Event: Trauma Status: Date Acquired: 06/27/2019 Comorbid Cataracts, Glaucoma, Optic Neuritis, Chronic Weeks Of Treatment: 11 History: sinus problems/congestion, Middle ear problems Clustered Wound: No Photos Wound Measurements Length: (cm) 2.5  Width: (cm) 2.4 Depth: (cm) 0.2 Area: (cm) 4.712 Volume: (cm) 0.942 % Reduction in Area: 55.9% % Reduction in Volume: 82.4% Epithelialization: None Tunneling: No Undermining: No Wound Description Full Thickness Without Exposed Support Classification: Structures Wound Margin: Flat and  Intact Exudate Medium Amount: Exudate Type: Serous Exudate Color: amber Foul Odor After Cleansing: No Slough/Fibrino Yes Wound Bed Granulation Amount: Medium (34-66%) Exposed Structure Granulation Quality: Red Fascia Exposed: No Necrotic Amount: Medium (34-66%) Fat Layer (Subcutaneous Tissue) Exposed: Yes Necrotic Quality: Adherent Slough Tendon Exposed: No Muscle Exposed: No Joint Exposed: No Bone Exposed: No CATELIN, MANTHE ([id number]) Electronic Signature(s) Signed: 09/19/2019 1:37:21 PM By: Curtis Sites Entered By: Curtis Sites on 09/19/2019 13:37:21 [name] ([id number]) -------------------------------------------------------------------------------- Vitals Details Patient Name: [name] Date of Service: 09/19/2019 1:00 PM Medical Record Number: [id number] Patient Account Number: [id number] Date of Birth/Sex: 29-Mar-1930 (85 y.o. F) Treating RN: Curtis Sites Primary Care Drucella Karbowski: Aram Beecham Other Clinician: Referring Brainard Highfill: Aram Beecham Treating Maebel Marasco/Extender: Linwood Dibbles, HOYT Weeks in Treatment: 11 Vital Signs Time Taken: 13:14 Temperature (F): 98.4 Height (in): 60 Pulse (bpm): 66 Weight (lbs): 125 Respiratory Rate (breaths/min): 16 Body Mass Index (BMI): 24.4 Blood Pressure (mmHg): 143/62 Reference Range: 80 - 120 mg / dl Electronic Signature(s) Signed: 09/19/2019 4:46:01 PM By: Curtis Sites Entered By: Curtis Sites on 09/19/2019 13:14:45

## 2019-09-19 NOTE — Progress Notes (Addendum)
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 closed down as much as I would like 09/19/18; Graphix #6 I think there is improvement in the undermining from 7 to 9:00. Wound bed looks healthy. 09/26/18 Graffix #7 undermining is 0.5 cm maximally at roughly 8:00. From 12 to 7:00 the tissue is adherent which is a major improvement there is some advancing skin from this side. 10/03/18; Graphix #8 no major changes from last week 10/10/18 Graffix #9 There are improvements. There appears to be granulation coming up to the surface here and there is a lot less undermining at 8:00. 10/17/18. Graffix #10; Dimensions are improved less undermining surface  felt the but the wound is still open. Initially a surgical wound following a bursectomy 10/24/18; Graffix #11. This is really stalled over the last 2 weeks. If there is no further improvement this will be the last application.The final option for this difficult area would be plastic surgery and will set up a consult with Dr. Marina Goodell in [facility] 10/31/18; wound looks about the same. The undermining superiorly is 0.7 cm. On the lateral edges perhaps some improvement there is no drainage. 11-07-2018 patient seen today for follow-up and management of left elbow wound. She has completed a total of 11 treatments of the graffix with not much improvement. She has an upcoming appointment with plastic surgery to assist with additional treatment options for the left elbow wound on 11/19/18. There is significant amount surrounding undermining of the wound is 0.9 cm. Currently prescribed methotrexate. Wound is being treated with Indoform and border dressing. No drainage from wound. No fever, chills. or pain. 11/21/18; the patient continues to have the wound looking roughly the same with undermining from about 12 to 6:00. This has not changed all that much. She does have skin irritation around the wound that looks like drainage maceration issues. The patient states that she was not able to have her wound dressing changed because of illness in the person he usually does this. She also did not attend her clinic appointment today with Dr. Marina Goodell because of transportation issues. She is rebooked for some time in mid January 11/28/2018; the patient has less undermining using endoform. As a understandings she saw Dr. Thad Ranger who is Dr. Lonni Fix partner. He recommended putting her in a elbow brace and I believe is written a prescription for it. He also recommended Motrin 800 mg 3 times daily. This is prescription strength ibuprofen although he did not write his prescription. This apparently was for 2 weeks.  Culture I did last time grew a few methicillin sensitive staph aureus. After some difficulty due to drug intolerances/allergies and drug interactions I settled on a 5-day course of azithromycin LILLIAH, PRIEGO ([id number]) 12/03/18 on evaluation today patient actually appears to be doing fairly well in regard to her elbow when compared to last time I evaluated her. With that being said there does not appear to be any signs of infection at this time. That was the big concern currently as far as the patient was  concerned. Nonetheless I do feel like she is making progress in regard to the feeling of this ulcer it has been slow. She did see a Engineer, petroleum they are talking about putting her in a brace in order to allow this area to heal more appropriately. 12/19/2018; not much change in this from the last time I have saw this.'s much smaller area than when she first came in and with less circumferential undermining however this is never really adhered. She is wearing the brace that was given or prescribed to her by plastics. She did not have a procedure offered to attempt to close this. We have been using endoform 1/15; wound actually is not doing as well as last week. She was actually not supposed to come into this clinic again until next week but apparently her attendant noticed some redness increasing pain and she came in early. She reports the same amount of drainage. We have been using endoform. She is approved through puraply however I will only consider starting that next week 1/22; she completed the antibiotics last week. Culture I did was negative. In spite of this there is less erythema and pain complaints in the wound. Puraply #1 applied today 1/29; Puraply #2 today. Wound surface looks a lot better post debridement of adherent fibrinous material. However undermining from 6-12 is measuring worse 2/5; Puraply #3. Using her elbow brace 2/12 puraply #4 2/19 puraply #5. The 9:00 undermining  measured at 0.5 cm. Undermining from 4-11 o'clock. Surface of the wound looks better and the circumference of the wound is smaller however the undermining is not really changed 2/26; still not much improvement. She has undermining from 4-9 o'clock 0.9 cm. Surface of the wound covered and adherent debris. 3/4; still no improvement. Undermining from 4-9 o'clock still around a centimeter. Surface of the wound looks somewhat better. No debridement is required we used endoform after we ended the trial of puraply last week 3/11; really no improvement at all. Still 1 cm undermining from roughly 9-3 o'clock. This is about a centimeter. The base of the wound looks fairly healthy. No debridement. We have been using endoform. I am really out of most usual options here. I could consider either another round of an amniotic advanced treatment product example epifix or perhaps regranex. Understandably the patient is a bit frustrated. We did send her to plastic surgery for a consult. Other than prescribing her a brace to immobilize the elbow they did not think she was a candidate for any further surgery. Notable that the patient is not using the brace today 3/18-Patient returns for attention to the left elbow area which apparently looked red at the home health visit. Patient's elbow looks the same if not better compared with last visit. The area of ulceration remains the same, the base appears healthy. We are continuing to use endoform she has been encouraged to use the brace to keep the elbow straight 3/25; the patient has an appointment at the [facility] wound care center on 4/2. I had actually put her out indefinitely however she seems to want to come back here every week. This week she complains of increased pain and malodor. Dimensions of the elbow wound are larger. We have been using endoform 4/8; the patient went to [facility] where they apparently gave her meta honey and some border foam with a Tubigrip. She has been  using this for a week. She says she was very impressed with them there. They did not offer her any surgical consultation.  She seems to be coming back here for a second opinion on this, she does not wish to drive to [facility] every week 0/984/22; still using Medihoney foam border and a Tubigrip. Actually do not think she has anything on the arm at all specifically she is not using her brace 5/6; she has been using medihoney without a lot of improvement. Undermining maximum at 9:00 at 0.5 cm may be somewhat better. She is still complaining of discomfort. She uses her elbow brace at night but is not using anything on the arm during the day, she finds it too restrictive 5/13; we switch the patient to endoform AG last week. She is complaining of more pain and worried about some circumferential erythema around the wound. I had planned to consider epifix in this wound however the patient came in with a request to see a plastic surgeon in [place] by the name of Dr. Wyline MoodBranch who cared for a friend of hers. Noteworthy that I have already sent her to one plastic surgeon and she went for another second opinion at [facility] and they apparently did not send her to a plastic surgeon nevertheless the patient is fairly convinced that she might benefit from a skin graft which I am doubtful. She also has rheumatoid arthritis and is on methotrexate. This was originally a surgical wound for a bursectomy a year or 2 ago Readmission: 06/28/19 on evaluation today patient presents today for reevaluation but this is due to a new issue her elbow has completely healed and looks excellent. Her right anterior lower leg has a skin tear which was sustained from her dog who jumped up on her and inadvertently scratch the area causing the skin tear. This happened yesterday. She is having some discomfort but fortunately nothing too significant which is good news. No fevers, chills, nausea, or vomiting noted at this time. The skin fortunately  was knocked one completely off and we are gonna see about we approximate in the skin as best we can in using Steri-Strips to hold this in place obviously if we can get some of this to reattach that would be beneficial [name], Jannetta V. ([id number]) 07/05/2019 on evaluation today patient actually appears to be doing okay although unfortunately the skin flap that we were attempting to Steri-Stripped down last week did not take. She is developing a lot of fluid underneath the wound area unfortunately which again is not ideal. I think that this necrotic tissue needs to be removed and again it actually just wiped off during the evaluation today as I was attempting to clean the wound there did not appear to be any significant issues underlying which is good although there was some purulent drainage I did want to go ahead and see about obtaining a culture from today in order to ensure that the Z-Pak that I placed her on earlier in the week was appropriate for treating what ever infection may be causing the issue currently. This was a deep wound culture obtained today. 07/12/2019 on evaluation today patient actually appears to be doing quite well with regard to her right lower extremity ulcer all things considering. There is still little bit of hematoma not it that is noted in the central portion of the wound along with some necrotic tissue but she is still having quite a bit of discomfort. For that reason I did not perform sharp debridement today although this wound does need some debridement of one type or another. I think we may attempt Iodoflex to see if  this can be of benefit. Fortunately there is no signs of active infection at this point. 07/18/19 upon evaluation today patient appears to be doing better with regard to her ulcer on her right lower extremity. She's been tolerating the dressing changes without complication. The good news is she seems to be making excellent progress. Overall I'm pleased with  there being no signs of infection. With that being said she does tell me that she's been having some discomfort with the wrap she's unsure of exactly what about the rafters causing her trouble. 07/25/2019 on evaluation today patient appears to be doing a little better in regard to her lower extremity ulcer. Unfortunately the alginate seems to be getting really stuck to the wound bed and surrounding periwound. Fortunately there is no signs of active infection at this time. There does not appear to be any evidence of infection currently. 08/01/2019 on evaluation today patient actually appears to be doing much better with regard to her right lower extremity ulcer. She has been tolerating the dressing changes without complication. Fortunately her wound does not show any signs of infection and seems to be making good progress. No fevers, chills, nausea, vomiting, or diarrhea. 08/08/2019 on evaluation today patient actually appears to be doing better with regard to her leg ulcer. She has been tolerating the dressing changes without complication. With that being said I think we may want to switch the dressing up today just based on the appearance of the wound bed in general. Fortunately there is no evidence of infection currently. Her pain seems to be much better. Her son is present with her today. 08/15/2019 on evaluation today patient appears to be doing very well with regard to her right lower extremity ulcer. She still complains about feeling like the compression stocking/Tubigrip is too tight for her. Nonetheless I still think this is beneficial and is helping the wound to heal more effectively. Overall I am extremely pleased at this time with what I am seeing. 08/22/2019 on evaluation today patient actually appears to be doing quite well with regard to her right lower extremity. She has been tolerating the dressing changes without complication. Fortunately there is no signs of active infection at this time.  She still complains about the Tubigrip but nonetheless I think this is something that is of utmost importance for her to continue to wear if she is going to see this area heal appropriately. 08/29/2019 on evaluation today patient actually appears to be doing well visually in regard to her wound. Unfortunately in regard to overall pain she seems to be having more pain today which I am somewhat concerned about. There does not appear to be any signs of active infection that I can tell but again with increased pain that is definitely a concern here today. I do think it may be time to switch up the dressing as well to something that will be a little bit more effective hopefully and new tissue growth she has done well with collagen in the past. 09/05/2019 on evaluation today patient actually appears to be doing well with regard to her leg ulcer. She has been tolerating the dressing changes without complication. Fortunately there is no signs of active infection. We did obtain a wound culture last week due to the fact that she still is very touchy as far as the surface of the wound is concerned. Unfortunately I am not sure what happened to that culture as there is no record in the computer system. Nonetheless we need  to follow-up on this and try to see if we can identify what exactly happened with this specimen as we have not gotten a result back either obviously. 09/12/2019 on evaluation today patient actually appears to be showing signs of good improvement upon evaluation today. She states her pain is not nearly as bad as what it was which is also good news. She did have a wound culture which was reviewed today that showed no growth of bacteria which is good news. This coupled with the fact that her wound appears to be healing better, measuring smaller, and overall even the slough buildup is minimal compared to what we have seen previously, I feel like she is actually showing signs of excellent improvement  today. AMIA, RYNDERS ([id number]) 09/19/2019 on evaluation today patient appears to be doing well with regard to her wounds on evaluation today. She is showing signs of continued improvement week by week and everything is measuring significantly smaller which is great news. Overall very pleased with the progress that she has made. Patient History Information obtained from Patient. Family History Heart Disease - Siblings,Father, No family history of Cancer, Diabetes, Hereditary Spherocytosis, Hypertension, Kidney Disease, Lung Disease, Seizures, Stroke, Thyroid Problems, Tuberculosis. Social History Never smoker, Alcohol Use - Daily, Drug Use - No History, Caffeine Use - Never. Medical History Eyes Patient has history of Cataracts, Glaucoma, Optic Neuritis Ear/Nose/Mouth/Throat Patient has history of Chronic sinus problems/congestion, Middle ear problems Medical And Surgical History Notes Constitutional Symptoms (General Health) Back pain Ear/Nose/Mouth/Throat bilateral hearing aides Review of Systems (ROS) Constitutional Symptoms (General Health) Denies complaints or symptoms of Fatigue, Fever, Chills, Marked Weight Change. Respiratory Denies complaints or symptoms of Chronic or frequent coughs, Shortness of Breath. Cardiovascular Denies complaints or symptoms of Chest pain, LE edema. Psychiatric Denies complaints or symptoms of Anxiety, Claustrophobia. Objective Constitutional Well-nourished and well-hydrated in no acute distress. Vitals Time Taken: 1:14 PM, Height: 60 in, Weight: 125 lbs, BMI: 24.4, Temperature: 98.4 F, Pulse: 66 bpm, Respiratory Rate: 16 breaths/min, Blood Pressure: 143/62 mmHg. Respiratory normal breathing without difficulty. clear to auscultation bilaterally. NATALIJA, MAVIS ([id number]) Cardiovascular regular rate and rhythm with normal S1, S2. Psychiatric this patient is able to make decisions and demonstrates good insight into disease process.  Alert and Oriented x 3. pleasant and cooperative. General Notes: Patient's wound bed currently showed signs of excellent granulation at this time there was some slough noted that required sharp debridement to clear away the necrotic tissue post debridement the wound bed appears to be doing much better which is excellent news. Overall she is happy with how things stand at this time. She just wishes she did not have to wear the Tubigrip for compression. Integumentary (Hair, Skin) Wound #11 status is Open. Original cause of wound was Trauma. The wound is located on the Right,Anterior Lower Leg. The wound measures 2.5cm length x 2.4cm width x 0.2cm depth; 4.712cm^2 area and 0.942cm^3 volume. There is Fat Layer (Subcutaneous Tissue) Exposed exposed. There is no tunneling or undermining noted. There is a medium amount of serous drainage noted. The wound margin is flat and intact. There is medium (34-66%) red granulation within the wound bed. There is a medium (34-66%) amount of necrotic tissue within the wound bed including Adherent Slough. Assessment Active Problems ICD-10 Unspecified open wound, right lower leg, initial encounter Non-pressure chronic ulcer of other part of right lower leg with fat layer exposed Struck by dog, initial encounter Procedures Wound #11 Pre-procedure diagnosis of Wound #11 is a Skin  Tear located on the Right,Anterior Lower Leg . There was a Excisional Skin/Subcutaneous Tissue Debridement with a total area of 6 sq cm performed by STONE III, HOYT E., PA-C. With the following instrument(s): Curette to remove Viable and Non-Viable tissue/material. Material removed includes Subcutaneous Tissue and Slough and after achieving pain control using Lidocaine. A time out was conducted at 13:46, prior to the start of the procedure. A Minimum amount of bleeding was controlled with Pressure. The procedure was tolerated well. Post Debridement Measurements: 2.5cm length x 2.4cm width  x 0.1cm depth; 0.471cm^3 volume. Character of Wound/Ulcer Post Debridement is stable. Post procedure Diagnosis Wound #11: Same as Pre-Procedure Plan Wound Cleansing: Wound #11 Right,Anterior Lower Leg: ESME, DURKIN ([id number]) Clean wound with Normal Saline. Wound #11 Right,Anterior Lower Leg: Clean wound with Normal Saline. Primary Wound Dressing: Wound #11 Right,Anterior Lower Leg: Silver Collagen Wound #11 Right,Anterior Lower Leg: Silver Collagen Secondary Dressing: Wound #11 Right,Anterior Lower Leg: Dry Gauze Conform/Kerlix Wound #11 Right,Anterior Lower Leg: Dry Gauze Conform/Kerlix Dressing Change Frequency: Wound #11 Right,Anterior Lower Leg: Change dressing every other day. Wound #11 Right,Anterior Lower Leg: Change dressing every other day. Follow-up Appointments: Wound #11 Right,Anterior Lower Leg: Return Appointment in 1 week. Wound #11 Right,Anterior Lower Leg: Return Appointment in 1 week. Edema Control: Wound #11 Right,Anterior Lower Leg: Other: - tubi grip F Wound #11 Right,Anterior Lower Leg: Other: - tubi grip F 1. My suggestion currently is can be that we go ahead and initiate a continuation of the silver collagen at this time as that seems to be doing well for the patient. 2. We will secure everything with Kerlix and then subsequently Tubigrip applied over top of this for at least some compression. 3. I do recommend the patient continue to elevate her legs as much as possible as this I believe definitely will help with the edema control. We will see patient back for reevaluation in 1 week here in the clinic. If anything worsens or changes patient will contact our office for additional recommendations. Electronic Signature(s) Signed: 09/19/2019 1:53:35 PM By: Lenda Kelp PA-C Entered By: Lenda Kelp on 09/19/2019 13:53:35 [name]  ([id number]) -------------------------------------------------------------------------------- ROS/PFSH Details Patient Name: [name] Date of Service: 09/19/2019 1:00 PM Medical Record Number: [id number] Patient Account Number: [id number] Date of Birth/Sex: [birth year] (85 y.o. F) Treating RN: Rodell Perna Primary Care Provider: Aram Beecham Other Clinician: Referring Provider: Aram Beecham Treating Provider/Extender: Linwood Dibbles, HOYT Weeks in Treatment: 11 Information Obtained From Patient Constitutional Symptoms (General Health) Complaints and Symptoms: Negative for: Fatigue; Fever; Chills; Marked Weight Change Medical History: Past Medical History Notes: Back pain Respiratory Complaints and Symptoms: Negative for: Chronic or frequent coughs; Shortness of Breath Cardiovascular Complaints and Symptoms: Negative for: Chest pain; LE edema Psychiatric Complaints and Symptoms: Negative for: Anxiety; Claustrophobia Eyes Medical History: Positive for: Cataracts; Glaucoma; Optic Neuritis Ear/Nose/Mouth/Throat Medical History: Positive for: Chronic sinus problems/congestion; Middle ear problems Past Medical History Notes: bilateral hearing aides HBO Extended History Items Ear/Nose/Mouth/Throat: Eyes: Eyes: Ear/Nose/Mouth/Throat: Chronic sinus Cataracts Glaucoma Middle ear problems problems/congestion Immunizations Pneumococcal Vaccine: Received Pneumococcal Vaccination: No Implantable Devices AIZA, VOLLRATH ([id number]) Yes Family and Social History Cancer: No; Diabetes: No; Heart Disease: Yes - Siblings,Father; Hereditary Spherocytosis: No; Hypertension: No; Kidney Disease: No; Lung Disease: No; Seizures: No; Stroke: No; Thyroid Problems: No; Tuberculosis: No; Never smoker; Alcohol Use: Daily; Drug Use: No History; Caffeine Use: Never; Financial Concerns: No; Food, Clothing or Shelter Needs: No; Support System Lacking: No; Transportation Concerns:  No Physician Affirmation I have reviewed and agree with the above information. Electronic Signature(s) Signed: 09/19/2019 5:11:53 PM By: Lenda Kelp PA-C Signed: 09/30/2019 8:11:06 AM By: Rodell Perna Entered By: Lenda Kelp on 09/19/2019 13:52:35 [name] ([id number]) -------------------------------------------------------------------------------- SuperBill Details Patient Name: [name] Date of Service: 09/19/2019 Medical Record Number: [id number] Patient Account Number: [id number] Date of Birth/Sex: December 28, 1929 (85 y.o. F) Treating RN: Rodell Perna Primary Care Provider: Aram Beecham Other Clinician: Referring Provider: Aram Beecham Treating Provider/Extender: Linwood Dibbles, HOYT Weeks in Treatment: 11 Diagnosis Coding ICD-10 Codes Code Description S81.801A Unspecified open wound, right lower leg, initial encounter L97.812 Non-pressure chronic ulcer of other part of right lower leg with fat layer exposed W54.1XXA Struck by dog, initial encounter Facility Procedures CPT4 Code Description: 78295621 [id number] - DEB SUBQ TISSUE 20 SQ CM/< ICD-10 Diagnosis Description L97.812 Non-pressure chronic ulcer of other part of right lower leg wit Modifier: h fat layer expo Quantity: 1 sed Physician Procedures CPT4 Code Description: 7846962 11042 - WC PHYS SUBQ TISS 20 SQ CM ICD-10 Diagnosis Description L97.812 Non-pressure chronic ulcer of other part of right lower leg wit Modifier: h fat layer expo Quantity: 1 sed Electronic Signature(s) Signed: 09/19/2019 1:53:45 PM By: Lenda Kelp PA-C Entered By: Lenda Kelp on 09/19/2019 13:53:44

## 2019-09-24 ENCOUNTER — Ambulatory Visit: Payer: Medicare Other

## 2019-09-24 ENCOUNTER — Other Ambulatory Visit: Payer: Self-pay

## 2019-09-24 DIAGNOSIS — M6281 Muscle weakness (generalized): Secondary | ICD-10-CM

## 2019-09-24 DIAGNOSIS — R262 Difficulty in walking, not elsewhere classified: Secondary | ICD-10-CM

## 2019-09-24 DIAGNOSIS — R2681 Unsteadiness on feet: Secondary | ICD-10-CM

## 2019-09-24 DIAGNOSIS — Z9181 History of falling: Secondary | ICD-10-CM

## 2019-09-24 DIAGNOSIS — G8929 Other chronic pain: Secondary | ICD-10-CM

## 2019-09-24 DIAGNOSIS — M545 Low back pain, unspecified: Secondary | ICD-10-CM

## 2019-09-24 NOTE — Therapy (Signed)
[facility] [address] [place], [address] Phone: [phone]   Fax:  [phone]  Physical Therapy Treatment  Patient Details  Name: [name] MRN: [id number] Date of Birth: [birth year] Referring Provider (PT): Jennings Books, MD   Encounter Date: 09/24/2019  PT End of Session - 09/24/19 1128    Visit Number  69    Number of Visits  110    Date for PT Re-Evaluation  10/10/19    Authorization Type  4    Authorization Time Period  10    PT Start Time  1128    PT Stop Time  1207    PT Time Calculation (min)  39 min    Activity Tolerance  Patient tolerated treatment well;Patient limited by fatigue    Behavior During Therapy  [facility] for tasks assessed/performed       Past Medical History:  Diagnosis Date  . Anemia   . GERD (gastroesophageal reflux disease)   . Hypertension   . Peripheral vascular disease (HCC)    possible neuropathies in lower extremeties  . PONV (postoperative nausea and vomiting)    happens sometimes but better with pre med of zofran  . Presence of permanent cardiac pacemaker   . Syncope     Past Surgical History:  Procedure Laterality Date  . ABDOMINAL HYSTERECTOMY  1969  . APPENDECTOMY  1969   with hysterectomy  . BACK SURGERY  2001   rods in back. surgery on back x 3  . COLONOSCOPY    . I&D EXTREMITY Left 03/27/2018   Procedure: IRRIGATION AND DEBRIDEMENT LEFT ELBOW / OLECRANON BURSA;  Surgeon: Corky Mull, MD;  Location: [facility] ORS;  Service: Orthopedics;  Laterality: Left;  . I&D EXTREMITY Left 06/05/2018   Procedure: IRRIGATION AND DEBRIDEMENT EXTREMITY;  Surgeon: Corky Mull, MD;  Location: [facility] OR[facility];  Service: Orthopedics;  Laterality: Left;  . INSERT / REPLACE / REMOVE PACEMAKER  2004  . JOINT REPLACEMENT Left 2004   partial knee replacement  . OLECRANON BURSECTOMY Left 03/27/2018   Procedure: LEFT OLECRANON BURSA;  Surgeon: Corky Mull, MD;  Location: [facility] OR[facility];   Service: Orthopedics;  Laterality: Left;  . PACEMAKER INSERTION  2014   dual chamber for complete heart block    There were no vitals filed for this visit.  Subjective Assessment - 09/24/19 1128    Subjective  Feels shaky and jerky. Not a good morning. L elbow had blood on the bandaid. Also has a lot on her mind.    Pertinent History  Imparied gait. Pt states that she has had balance issues for too long. Feels more comfortable walking with her rw. Had tests which showed that she has neuropathy. However the doctor at [facility] said that her balance is coming from her back. Just wants to be strong so she can walk.  Started walking with her rw off and on for the past several months.  Also feels like she might need a new sleep number bed. Also had 2 wound surgeries at [name]. Pt fell in February 2019. Had to stop PT due to the L elbow surgery April 06, 2018 to remove a bursa. The wound still has not healed.   Currently sees a wound doctor.   Feels numbness L foot from the ankle down.  No R foot numbness.  Sometimes unsure of where her L LE is in space.  Pt also states that she tires too  quickly.  Has not been able to walk her dog in 2 years. Has not really been doing exercises at home except the sit <> stand. Pt states that prolonged sitting without back support will increase back pain     Currently in Pain?  No/denies    Pain Score  0-No pain                               PT Education - 09/24/19 1137    Education provided  Yes    Education Details  ther-ex    [employer]) Educated  Patient    Methods  Explanation;Demonstration;Tactile cues;Verbal cues    Comprehension  Verbalized understanding;Returned demonstration      OBJECTIVE  Pt ambulating with rw   No latex band allergies    MedbridgeAccess Code: [email or web address]   Pt has pacemaker  Wound with dressing R tibialis anterior muscle.  Therapeutic exercise:  Seated manually resisted trunk  extension isometrics in neutral 10x2 with 10 second holds   Seated B scapular retraction 10x10 seconds for 2 sets to promote thoracic extension and decrease low back extension pressure  Seated B ankle DF/PF 10x3 to promote LE neural mobility  Seated hip extension isometrics    R 10x5 seconds for 2 sets   L 10x5 seconds for 2 sets  Seated clamshell isometrics, hips less than 90 degrees flexion 10x5 seconds for 3 sets to promote glute med muscle strengthening  Seated hip adduction ball and glute max squeeze 10x5 seconds for 3 sets  Standing static mini squats without UE assist 10x3  Static mini lunge with B UE assist 10x3 each LE.    Able to perform previous 2 exercises in entirety without sitting rest break    Improved exercise technique, movement at target joints, use of target muscles after mod verbal, visual, tactile cues.    Patient response to treatment: Good muscle use felt with exercises.Pt tolerated session well without aggravation of symptoms.  Clinical Impression Worked on glute and trunk strengthening as well as thoracic extension to decrease low back extension pressure to see if it will help pt ambulate with less reports of jitteriness with gait. Possible decrease in symptoms but uncertain reported by pt. Pt tolerated session well without aggravation of symptoms. Pt will benefit from continued skilled physica therapy services to improve LE strength, function, balance, and decrease difficulty with gait.    PT Short Term Goals - 04/25/19 1723      PT SHORT TERM GOAL #1   Title  Patient will be independent with her HEP to improve strength and balance.     Time  3    Period  Weeks    Status  On-going    Target Date  05/08/19        PT Long Term Goals - 09/03/19 1505      PT LONG TERM GOAL #1   Title  Patient will improve her 10 MWT to 0.8 m/s or more with rw to promote better community ambulation.     Baseline  0.5 seconds with rw (09/06/2018); 0.63 seconds  average with rw (10/18/2018); 0.69 m/s average using rw (12/10/2018); 0.59 m/s using SPC and CGA (01/08/2019); 0.60 m/s using SPC (01/21/2019); 0.56 m/s with SPC to no AD assist, CGA (02/04/2019); 0.45 m/s average (04/17/2019); 0.53 m/s with SPC (06/06/2019); 0.26 m/s average wiht SPC (08/13/2019); unable to assess today secondary to difficulty ambulating with [facility] today (09/03/2019)  Time  8    Period  Weeks    Status  On-going      PT LONG TERM GOAL #2   Title  Patient will improve her DGI score using rw or least restrictive AD to 19/24 or more to promote balance.     Baseline  13/24 using rw (09/06/2018); 14/24 using rw (10/29/2018); 16/24 (12/10/2018); 14/24 using SPC (01/21/2019); 9/24 using SPC (04/17/2019); 14/24 (06/11/2019; unable to complete test today secondary to pt request (08/13/2019); [facility] to assess today [facility])    Time  8    Period  Weeks    Status  On-going    Target Date  10/10/19      PT LONG TERM GOAL #3   Title  Patient will improve bilateral LE strength by at least 1/2 MMT to promote ability to support herself when performing standing tasks and improve balance.     Time  8    Period  Weeks    Status  On-going    Target Date  10/10/19      PT LONG TERM GOAL #4   Title  Patient will have a decrease in back pain/ache to 5/10 or less to promote ability to perform standing tasks with less difficulty.    Baseline  8/10 at most for the past 3 months (09/06/2018); 5-6/10 at worst for the past 7 days (10/18/2018); 5/10 at worst for the past 7 days (12/10/2018)    Time  6    Period  Weeks    Status  Achieved            Plan - 09/24/19 1137    Clinical Impression Statement  Worked on glute and trunk strengthening as well as thoracic extension to decrease low back extension pressure to see if it will help pt ambulate with less reports of jitteriness with gait. Possible decrease in symptoms but uncertain reported by pt. Pt tolerated  session well without aggravation of symptoms. Pt will benefit from continued skilled physica therapy services to improve LE strength, function, balance, and decrease difficulty with gait.    Personal Factors and Comorbidities  Age;Comorbidity 3+;Time since onset of injury/illness/exacerbation;Past/Current Experience    Comorbidities  Pacemaker, L elbow and R tibialis anterior wounds with difficulty healing, back surgery    Examination-Activity Limitations  Carry;Locomotion Level;Stairs    Stability/Clinical Decision Making  Evolving/Moderate complexity    Rehab Potential  Fair    Clinical Impairments Affecting Rehab Potential  age, fear of falling, weakness    PT Frequency  2x / week    PT Duration  8 weeks    PT Treatment/Interventions  Aquatic Therapy;Gait training;Therapeutic activities;Therapeutic exercise;Balance training;Neuromuscular re-education;Patient/family education;Manual techniques;Dry needling    PT Next Visit Plan  trunk, hip, scapular strengthening, balance, gait, manual techniques PRN    Consulted and Agree with Plan of Care  Patient       Patient will benefit from skilled therapeutic intervention in order to improve the following deficits and impairments:  Abnormal gait, Decreased activity tolerance, Decreased balance, Decreased endurance, Decreased range of motion, Decreased strength, Difficulty walking, Improper body mechanics, Postural dysfunction, Pain, Decreased knowledge of use of DME  Visit Diagnosis: Muscle weakness (generalized)  Difficulty in walking, not elsewhere classified  History of falling  Unsteadiness on feet  Chronic low back pain, unspecified back pain laterality, unspecified whether sciatica present     Problem List Patient Active Problem List   Diagnosis Date Noted  . Septic  olecranon bursitis of left elbow 03/27/2018  . Lymphedema 10/11/2017  . Chronic venous insufficiency 10/11/2017  . Leg pain 10/11/2017  . Leg swelling 10/11/2017  .  COPD (chronic obstructive pulmonary disease) (HCC) 10/11/2017  . Essential hypertension 10/11/2017    Loralyn Freshwater PT, DPT   09/24/2019, 12:31 PM  [facility] [address], [place], [address] Phone: [phone]   Fax:  [phone]  Name: [name] MRN: [id number] Date of Birth: 05-12-1930

## 2019-09-26 ENCOUNTER — Encounter: Payer: Medicare Other | Admitting: Physician Assistant

## 2019-09-26 ENCOUNTER — Other Ambulatory Visit: Payer: Self-pay

## 2019-09-26 ENCOUNTER — Ambulatory Visit: Payer: Medicare Other

## 2019-09-26 DIAGNOSIS — L97819 Non-pressure chronic ulcer of other part of right lower leg with unspecified severity: Secondary | ICD-10-CM | POA: Diagnosis not present

## 2019-09-26 NOTE — Progress Notes (Addendum)
[name], [name] ([id number]) Visit Report for 09/26/2019 Arrival Information Details Patient Name: [name], [name]. Date of Service: 09/26/2019 1:00 PM Medical Record Number: [id number] Patient Account Number: [id number] Date of Birth/Sex: Sep 28, 1930 (85 y.o. F) Treating RN: [name] Primary Care [name]: [name] Other Clinician: Referring [name]: [name] Treating Alylah Blakney/Extender: [name]: 12 Visit Information History Since Last Visit Added or deleted any medications: No Patient Arrived: Walker Any new allergies or adverse reactions: No Arrival Time: 12:59 Had a fall or experienced change in No Accompanied By: self activities of daily living that may affect Transfer Assistance: None risk of falls: Patient Identification Verified: Yes Signs or symptoms of abuse/neglect since last visito No Secondary Verification Process Completed: Yes Hospitalized since last visit: No Patient Requires Transmission-Based Precautions: No Implantable device outside of the clinic excluding No Patient Has Alerts: No cellular tissue based products placed in the center since last visit: Has Dressing in Place as Prescribed: Yes Pain Present Now: No Electronic Signature(s) Signed: 09/26/2019 2:37:26 PM By: [name] [name] Entered By: [name] on 09/26/2019 12:59:47 [name] ([id number]) -------------------------------------------------------------------------------- Clinic Level of Care Assessment Details Patient Name: [name] Date of Service: 09/26/2019 1:00 PM Medical Record Number: [id number] Patient Account Number: [id number] Date of Birth/Sex: Nov 06, 1930 (85 y.o. F) Treating RN: [name] Primary Care Dashel Barnett: [name] Other Clinician: Referring [name]: [name] Treating [name]/Extender: [name]: 12 Clinic Level of Care Assessment  Items TOOL 4 Quantity Score []  - Use when only an EandM is performed on FOLLOW-UP visit 0 ASSESSMENTS - Nursing Assessment / Reassessment []  - Reassessment of Co-morbidities (includes updates in patient status) 0 X- 1 5 Reassessment of Adherence to Barnett Plan ASSESSMENTS - Wound and Skin Assessment / Reassessment X - Simple Wound Assessment / Reassessment - one wound 1 5 []  - 0 Complex Wound Assessment / Reassessment - multiple wounds []  - 0 Dermatologic / Skin Assessment (not related to wound area) ASSESSMENTS - Focused Assessment []  - Circumferential Edema Measurements - multi extremities 0 []  - 0 Nutritional Assessment / Counseling / Intervention []  - 0 Lower Extremity Assessment (monofilament, tuning fork, pulses) []  - 0 Peripheral Arterial Disease Assessment (using hand held doppler) ASSESSMENTS - Ostomy and/or Continence Assessment and Care []  - Incontinence Assessment and Management 0 []  - 0 Ostomy Care Assessment and Management (repouching, etc.) PROCESS - Coordination of Care X - Simple Patient / Family Education for ongoing care 1 15 []  - 0 Complex (extensive) Patient / Family Education for ongoing care []  - 0 Staff obtains Programmer, systems, Records, Test Results / Process Orders []  - 0 Staff telephones HHA, Nursing Homes / Clarify orders / etc []  - 0 Routine Transfer to another Facility (non-emergent condition) []  - 0 Routine Hospital Admission (non-emergent condition) []  - 0 New Admissions / Biomedical engineer / Ordering NPWT, Apligraf, etc. []  - 0 Emergency Hospital Admission (emergent condition) X- 1 10 Simple Discharge Coordination [name] ([id number]) []  - 0 Complex (extensive) Discharge Coordination PROCESS - Special Needs []  - Pediatric / Minor Patient Management 0 []  - 0 Isolation Patient Management []  - 0 Hearing / Language / Visual special needs []  - 0 Assessment of Community assistance (transportation, D/C planning, etc.) []  -  0 Additional assistance / Altered mentation []  - 0 Support Surface(s) Assessment (bed, cushion, seat, etc.) INTERVENTIONS - Wound Cleansing / Measurement X - Simple Wound Cleansing - one wound 1 5 []  -  0 Complex Wound Cleansing - multiple wounds X- 1 5 Wound Imaging (photographs - any number of wounds) []  - 0 Wound Tracing (instead of photographs) X- 1 5 Simple Wound Measurement - one wound []  - 0 Complex Wound Measurement - multiple wounds INTERVENTIONS - Wound Dressings []  - Small Wound Dressing one or multiple wounds 0 X- 1 15 Medium Wound Dressing one or multiple wounds []  - 0 Large Wound Dressing one or multiple wounds []  - 0 Application of Medications - topical []  - 0 Application of Medications - injection INTERVENTIONS - Miscellaneous []  - External ear exam 0 []  - 0 Specimen Collection (cultures, biopsies, blood, body fluids, etc.) []  - 0 Specimen(s) / Culture(s) sent or taken to Lab for analysis []  - 0 Patient Transfer (multiple staff / Nurse, adult / Similar devices) []  - 0 Simple Staple / Suture removal (25 or less) []  - 0 Complex Staple / Suture removal (26 or more) []  - 0 Hypo / Hyperglycemic Management (close monitor of Blood Glucose) []  - 0 Ankle / Brachial Index (ABI) - do not check if billed separately X- 1 5 Vital Signs [name], [name] ([id number]) Has the patient been seen at the hospital within the last three years: Yes Total Score: 70 Level Of Care: New/Established - Level 2 Electronic Signature(s) Signed: 09/26/2019 5:55:45 PM By: [name], [name] Entered By: [name], BSN, RN, CWS, Kim on 09/26/2019 17:48:33 [name] ([id number]) -------------------------------------------------------------------------------- Encounter Discharge Information Details Patient Name: [name] Date of Service: 09/26/2019 1:00 PM Medical Record Number: [id number] Patient Account Number: [id number] Date of Birth/Sex: 1930/07/19 (85 y.o.  F) Treating RN: [name] Primary Care [name]: [name] Other Clinician: Referring [name]: [name] Treating [name]: Natasha Dibbles, [name] Weeks in Barnett: 12 Encounter Discharge Information Items Discharge Condition: Stable Ambulatory Status: Walker Discharge Destination: Home Transportation: Private Auto Accompanied By: self Schedule Follow-up Appointment: Yes Clinical Summary of Care: Electronic Signature(s) Signed: 09/26/2019 5:49:38 PM By: [name], [name] Entered By: [name], BSN, RN, CWS, Kim on 09/26/2019 17:49:38 [name] ([id number]) -------------------------------------------------------------------------------- Lower Extremity Assessment Details Patient Name: [name] Date of Service: 09/26/2019 1:00 PM Medical Record Number: [id number] Patient Account Number: [id number] Date of Birth/Sex: 10/12/1930 (85 y.o. F) Treating RN: Curtis Sites Primary Care Leilan Bochenek: [name] Other Clinician: Referring Alizae Bechtel: [name] Treating Taye Cato/Extender: Natasha Dibbles, [name] Weeks in Barnett: 12 Edema Assessment Assessed: [Left: No] [Right: No] Edema: [Left: Ye] [Right: s] Calf Left: Right: Point of Measurement: 30 cm From Medial Instep cm 34 cm Ankle Left: Right: Point of Measurement: 10 cm From Medial Instep cm 23 cm Vascular Assessment Pulses: Dorsalis Pedis Palpable: [Right:Yes] Electronic Signature(s) Signed: 09/26/2019 5:17:59 PM By: Curtis Sites Entered By: Curtis Sites on 09/26/2019 13:10:10 [name] ([id number]) -------------------------------------------------------------------------------- Multi Wound Chart Details Patient Name: [name] Date of Service: 09/26/2019 1:00 PM Medical Record Number: [id number] Patient Account Number: [id number] Date of Birth/Sex: 18-Jul-1930 (85 y.o. F) Treating RN: [name] Primary Care Kalany Diekmann: [name] Other Clinician: Referring  Sherre Wooton: [name] Treating Naythen Heikkila/Extender: Natasha Dibbles, [name] Weeks in Barnett: 12 Vital Signs Height(in): 60 Pulse(bpm): 60 Weight(lbs): 125 Blood Pressure(mmHg): 127/47 Body Mass Index(BMI): 24 Temperature(F): 97.9 Respiratory Rate 16 (breaths/min): Photos: [N/A:N/A] Wound Location: Right Lower Leg - Anterior N/A N/A Wounding Event: Trauma N/A N/A Primary Etiology: Skin Tear N/A N/A Comorbid History: Cataracts, Glaucoma, Optic N/A N/A Neuritis, Chronic sinus problems/congestion, Middle ear problems Date Acquired: 06/27/2019 N/A  N/A Weeks of Barnett: 12 N/A N/A Wound Status: Open N/A N/A Measurements L x W x D 2.4x2.3x0.2 N/A N/A (cm) Area (cm) : 4.335 N/A N/A Volume (cm) : 0.867 N/A N/A % Reduction in Area: 59.40% N/A N/A % Reduction in Volume: 83.80% N/A N/A Classification: Full Thickness Without N/A N/A Exposed Support Structures Exudate Amount: Medium N/A N/A Exudate Type: Serous N/A N/A Exudate Color: amber N/A N/A Wound Margin: Flat and Intact N/A N/A Granulation Amount: Medium (34-66%) N/A N/A Granulation Quality: Red N/A N/A Necrotic Amount: Medium (34-66%) N/A N/A Exposed Structures: Fat Layer (Subcutaneous N/A N/A Tissue) Exposed: Yes Fascia: No Tendon: No [name], [name] ([id number]) Muscle: No Joint: No Bone: No Epithelialization: None N/A N/A Barnett Notes Electronic Signature(s) Signed: 09/26/2019 5:55:45 PM By: [name], [name] Entered By: [name], BSN, RN, CWS, Kim on 09/26/2019 13:22:26 [name] ([id number]) -------------------------------------------------------------------------------- Multi-Disciplinary Care Plan Details Patient Name: [name] Date of Service: 09/26/2019 1:00 PM Medical Record Number: [id number] Patient Account Number: [id number] Date of Birth/Sex: 08/18/1930 (85 y.o. F) Treating RN: [name] Primary Care Blaise Grieshaber: [name] Other Clinician: Referring Mee Macdonnell:  [name] Treating Marley Pakula/Extender: Natasha Dibbles, [name] Weeks in Barnett: 12 Active Inactive Abuse / Safety / Falls / Self Care Management Nursing Diagnoses: Potential for falls Goals: Patient will remain injury free related to falls Date Initiated: 06/28/2019 Target Resolution Date: 09/21/2019 Goal Status: Active Interventions: Assess fall risk on admission and as needed Notes: Wound/Skin Impairment Nursing Diagnoses: Impaired tissue integrity Goals: Ulcer/skin breakdown will heal within 14 weeks Date Initiated: 06/28/2019 Target Resolution Date: 09/21/2019 Goal Status: Active Interventions: Assess patient/caregiver ability to obtain necessary supplies Assess patient/caregiver ability to perform ulcer/skin care regimen upon admission and as needed Assess ulceration(s) every visit Notes: Electronic Signature(s) Signed: 09/26/2019 5:55:45 PM By: [name], [name] Entered By: [name], BSN, RN, CWS, Kim on 09/26/2019 13:22:17 [name] ([id number]) -------------------------------------------------------------------------------- Pain Assessment Details Patient Name: [name] Date of Service: 09/26/2019 1:00 PM Medical Record Number: [id number] Patient Account Number: [id number] Date of Birth/Sex: 09-25-1930 (85 y.o. F) Treating RN: [name] Primary Care Djibril Glogowski: [name] Other Clinician: Referring Leticia Mcdiarmid: [name] Treating Masoud Nyce/Extender: Natasha Dibbles, [name] Weeks in Barnett: 12 Active Problems Location of Pain Severity and Description of Pain Patient Has Paino No Site Locations Pain Management and Medication Current Pain Management: Electronic Signature(s) Signed: 09/26/2019 2:37:26 PM By: Dayton Martes [name] Signed: 09/26/2019 5:55:45 PM By: [name], [name] Entered By: Dayton Martes on 09/26/2019 12:59:55 [name]  ([id number]) -------------------------------------------------------------------------------- Patient/Caregiver Education Details Patient Name: [name] Date of Service: 09/26/2019 1:00 PM Medical Record Number: [id number] Patient Account Number: [id number] Date of Birth/Gender: October 01, 1930 (85 y.o. F) Treating RN: [name] Primary Care Physician: [name] Other Clinician: Referring Physician: Aram Barnett Treating Physician/Extender: Skeet Simmer in Barnett: 12 Education Assessment Education Provided To: Patient Education Topics Provided Wound/Skin Impairment: Handouts: Caring for Your Ulcer Methods: Demonstration, Explain/Verbal Responses: Return demonstration correctly Electronic Signature(s) Signed: 09/26/2019 5:55:45 PM By: [name], [name] Entered By: [name], BSN, RN, CWS, Kim on 09/26/2019 17:49:03 [name] ([id number]) -------------------------------------------------------------------------------- Wound Assessment Details Patient Name: [name] Date of Service: 09/26/2019 1:00 PM Medical Record Number: [id number] Patient Account Number: [id number] Date of Birth/Sex: 1930-08-24 (85 y.o. F) Treating RN: Curtis Sites Primary Care Talani Brazee: [name] Other Clinician: Referring Fany Cavanaugh: [name] Treating  Tammey Deeg/Extender: Natasha DibblesSTONE III, [name] Weeks in Barnett: 12 Wound Status Wound Number: 11 Primary Skin Tear Etiology: Wound Location: Right Lower Leg - Anterior Wound Open Wounding Event: Trauma Status: Date Acquired: 06/27/2019 Comorbid Cataracts, Glaucoma, Optic Neuritis, Chronic Weeks Of Barnett: 12 History: sinus problems/congestion, Middle ear problems Clustered Wound: No Photos Wound Measurements Length: (cm) 2.4 Width: (cm) 2.3 Depth: (cm) 0.2 Area: (cm) 4.335 Volume: (cm) 0.867 % Reduction in Area: 59.4% % Reduction in Volume: 83.8% Epithelialization: None Tunneling:  No Undermining: No Wound Description Full Thickness Without Exposed Support Classification: Structures Wound Margin: Flat and Intact Exudate Medium Amount: Exudate Type: Serous Exudate Color: amber Foul Odor After Cleansing: No Slough/Fibrino Yes Wound Bed Granulation Amount: Medium (34-66%) Exposed Structure Granulation Quality: Red Fascia Exposed: No Necrotic Amount: Medium (34-66%) Fat Layer (Subcutaneous Tissue) Exposed: Yes Necrotic Quality: Adherent Slough Tendon Exposed: No Muscle Exposed: No Joint Exposed: No Bone Exposed: No [name], Natasha VMarland Kitchen. ([id number]) Barnett Notes Wound #11 (Right, Anterior Lower Leg) Notes prisma, conform, tubi grip F Electronic Signature(s) Signed: 09/26/2019 5:17:59 PM By: Curtis Sitesorthy, Joanna Entered By: Curtis Sitesorthy, Joanna on 09/26/2019 13:11:33 [name], Jeananne Barnett. ([id number]) -------------------------------------------------------------------------------- Vitals Details Patient Name: [name], [name]. Date of Service: 09/26/2019 1:00 PM Medical Record Number: [id number] Patient Account Number: [id number] Date of Birth/Sex: 07/03/1930 (85 y.o. F) Treating RN: Natasha CoventryWoody, Kim Primary Care Aeris Hersman: Natasha BeechamSparks, Jeffrey Other Clinician: Referring Bryana Froemming: Natasha BeechamSparks, Jeffrey Treating Musette Kisamore/Extender: Natasha DibblesSTONE III, [name] Weeks in Barnett: 12 Vital Signs Time Taken: 12:59 Temperature (F): 97.9 Height (in): 60 Pulse (bpm): 60 Weight (lbs): 125 Respiratory Rate (breaths/min): 16 Body Mass Index (BMI): 24.4 Blood Pressure (mmHg): 127/47 Reference Range: 80 - 120 mg / dl Electronic Signature(s) Signed: 09/26/2019 2:37:26 PM By: Dayton MartesWallace, NatashaRRT,Barnett, Sallie [name] Entered By: Dayton MartesWallace, NatashaRRT,Barnett, Sallie on 09/26/2019 13:03:17

## 2019-09-26 NOTE — Progress Notes (Addendum)
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 No drainage from wound. No fever, chills. or pain. 11/21/18; the patient continues to have the wound looking roughly the same with undermining from about 12 to 6:00. This has not changed all that much. She does have skin irritation around the wound that looks like drainage maceration issues. The patient states that she was not able to have her wound dressing changed because of illness in the person he usually does this. She also did not attend her clinic appointment today with Dr. Marina Goodell because of transportation issues. She is rebooked for some time in mid January 11/28/2018; the patient has less undermining using endoform. As a understandings she  saw Dr. Thad Ranger who is Dr. Lonni Fix partner. He recommended putting her in a elbow brace and I believe is written a prescription for it. He also recommended Motrin 800 mg 3 times daily. This is prescription strength ibuprofen although he did not write his prescription. This apparently was for 2 weeks. Culture I did last time grew a few methicillin sensitive staph aureus. After some difficulty due to drug intolerances/allergies and drug interactions I settled on a 5-day course of azithromycin TASHAI, [name] ([id number]) 12/03/18 on evaluation today patient actually appears to be doing fairly well in regard to her elbow when compared to last time I evaluated her. With that being said there does not appear to be any signs of infection at this time. That was the big concern currently as far as the patient was concerned. Nonetheless I do feel like she is making progress in regard to the feeling of this ulcer it has been slow. She did see a Engineer, petroleum they are talking about putting her in a brace in order to allow this area to heal more appropriately. 12/19/2018; not much change in this from the last time I have saw this.'s much smaller area than when she first came in and with less circumferential undermining however this is never really adhered. She is wearing the brace that was given or prescribed to her by plastics. She did not have a procedure offered to attempt to close this. We have been using endoform 1/15; wound actually is not doing as well as last week. She was actually not supposed to come into this clinic again until next week but apparently her attendant noticed some redness increasing pain and she came in early. She reports the same amount of drainage. We have been using endoform. She is approved through puraply however I will only consider starting that next week 1/22; she completed the antibiotics last week. Culture I did was negative. In spite of  this there is less erythema and  pain complaints in the wound. Puraply #1 applied today 1/29; Puraply #2 today. Wound surface looks a lot better post debridement of adherent fibrinous material. However undermining from 6-12 is measuring worse 2/5; Puraply #3. Using her elbow brace 2/12 puraply #4 2/19 puraply #5. The 9:00 undermining measured at 0.5 cm. Undermining from 4-11 o'clock. Surface of the wound looks better and the circumference of the wound is smaller however the undermining is not really changed 2/26; still not much improvement. She has undermining from 4-9 o'clock 0.9 cm. Surface of the wound covered and adherent debris. 3/4; still no improvement. Undermining from 4-9 o'clock still around a centimeter. Surface of the wound looks somewhat better. No debridement is required we used endoform after we ended the trial of puraply last week 3/11; really no improvement at all. Still 1 cm undermining from roughly 9-3 o'clock. This is about a centimeter. The base of the wound looks fairly healthy. No debridement. We have been using endoform. I am really out of most usual options here. I could consider either another round of an amniotic advanced treatment product example epifix or perhaps regranex. Understandably the patient is a bit frustrated. We did send her to plastic surgery for a consult. Other than prescribing her a brace to immobilize the elbow they did not think she was a candidate for any further surgery. Notable that the patient is not using the brace today 3/18-Patient returns for attention to the left elbow area which apparently looked red at the home health visit. Patient's elbow looks the same if not better compared with last visit. The area of ulceration remains the same, the base appears healthy. We are continuing to use endoform she has been encouraged to use the brace to keep the elbow straight 3/25; the patient has an appointment at the [facility] wound care center on 4/2. I had actually put her out  indefinitely however she seems to want to come back here every week. This week she complains of increased pain and malodor. Dimensions of the elbow wound are larger. We have been using endoform 4/8; the patient went to [facility] where they apparently gave her meta honey and some border foam with a Tubigrip. She has been using this for a week. She says she was very impressed with them there. They did not offer her any surgical consultation. She seems to be coming back here for a second opinion on this, she does not wish to drive to [facility] every week 1/61; still using Medihoney foam border and a Tubigrip. Actually do not think she has anything on the arm at all specifically she is not using her brace 5/6; she has been using medihoney without a lot of improvement. Undermining maximum at 9:00 at 0.5 cm may be somewhat better. She is still complaining of discomfort. She uses her elbow brace at night but is not using anything on the arm during the day, she finds it too restrictive 5/13; we switch the patient to endoform AG last week. She is complaining of more pain and worried about some circumferential erythema around the wound. I had planned to consider epifix in this wound however the patient came in with a request to see a plastic surgeon in [place] by the name of Dr. Wyline Mood who cared for a friend of hers. Noteworthy that I have already sent her to one plastic surgeon and she went for another second opinion at [facility] and they apparently did not send her to  a plastic surgeon nevertheless the patient is fairly convinced that she might benefit from a skin graft which I am doubtful. She also has rheumatoid arthritis and is on methotrexate. This was originally a surgical wound for a bursectomy a year or 2 ago Readmission: 06/28/19 on evaluation today patient presents today for reevaluation but this is due to a new issue her elbow has completely healed and looks excellent. Her right anterior lower leg has a  skin tear which was sustained from her dog who jumped up on her and inadvertently scratch the area causing the skin tear. This happened yesterday. She is having some discomfort but fortunately nothing too significant which is good news. No fevers, chills, nausea, or vomiting noted at this time. The skin fortunately was knocked one completely off and we are gonna see about we approximate in the skin as best we can in using Steri-Strips to hold this in place obviously if we can get some of this to reattach that would be beneficial TAKASHA, VETERE. ([id number]) 07/05/2019 on evaluation today patient actually appears to be doing okay although unfortunately the skin flap that we were attempting to Steri-Stripped down last week did not take. She is developing a lot of fluid underneath the wound area unfortunately which again is not ideal. I think that this necrotic tissue needs to be removed and again it actually just wiped off during the evaluation today as I was attempting to clean the wound there did not appear to be any significant issues underlying which is good although there was some purulent drainage I did want to go ahead and see about obtaining a culture from today in order to ensure that the Z-Pak that I placed her on earlier in the week was appropriate for treating what ever infection may be causing the issue currently. This was a deep wound culture obtained today. 07/12/2019 on evaluation today patient actually appears to be doing quite well with regard to her right lower extremity ulcer all things considering. There is still little bit of hematoma not it that is noted in the central portion of the wound along with some necrotic tissue but she is still having quite a bit of discomfort. For that reason I did not perform sharp debridement today although this wound does need some debridement of one type or another. I think we may attempt Iodoflex to see if this can be of benefit. Fortunately there  is no signs of active infection at this point. 07/18/19 upon evaluation today patient appears to be doing better with regard to her ulcer on her right lower extremity. She's been tolerating the dressing changes without complication. The good news is she seems to be making excellent progress. Overall I'm pleased with there being no signs of infection. With that being said she does tell me that she's been having some discomfort with the wrap she's unsure of exactly what about the rafters causing her trouble. 07/25/2019 on evaluation today patient appears to be doing a little better in regard to her lower extremity ulcer. Unfortunately the alginate seems to be getting really stuck to the wound bed and surrounding periwound. Fortunately there is no signs of active infection at this time. There does not appear to be any evidence of infection currently. 08/01/2019 on evaluation today patient actually appears to be doing much better with regard to her right lower extremity ulcer. She has been tolerating the dressing changes without complication. Fortunately her wound does not show any signs of infection and seems  to be making good progress. No fevers, chills, nausea, vomiting, or diarrhea. 08/08/2019 on evaluation today patient actually appears to be doing better with regard to her leg ulcer. She has been tolerating the dressing changes without complication. With that being said I think we may want to switch the dressing up today just based on the appearance of the wound bed in general. Fortunately there is no evidence of infection currently. Her pain seems to be much better. Her son is present with her today. 08/15/2019 on evaluation today patient appears to be doing very well with regard to her right lower extremity ulcer. She still complains about feeling like the compression stocking/Tubigrip is too tight for her. Nonetheless I still think this is beneficial and is helping the wound to heal more effectively.  Overall I am extremely pleased at this time with what I am seeing. 08/22/2019 on evaluation today patient actually appears to be doing quite well with regard to her right lower extremity. She has been tolerating the dressing changes without complication. Fortunately there is no signs of active infection at this time. She still complains about the Tubigrip but nonetheless I think this is something that is of utmost importance for her to continue to wear if she is going to see this area heal appropriately. 08/29/2019 on evaluation today patient actually appears to be doing well visually in regard to her wound. Unfortunately in regard to overall pain she seems to be having more pain today which I am somewhat concerned about. There does not appear to be any signs of active infection that I can tell but again with increased pain that is definitely a concern here today. I do think it may be time to switch up the dressing as well to something that will be a little bit more effective hopefully and new tissue growth she has done well with collagen in the past. 09/05/2019 on evaluation today patient actually appears to be doing well with regard to her leg ulcer. She has been tolerating the dressing changes without complication. Fortunately there is no signs of active infection. We did obtain a wound culture last week due to the fact that she still is very touchy as far as the surface of the wound is concerned. Unfortunately I am not sure what happened to that culture as there is no record in the computer system. Nonetheless we need to follow-up on this and try to see if we can identify what exactly happened with this specimen as we have not gotten a result back either obviously. 09/12/2019 on evaluation today patient actually appears to be showing signs of good improvement upon evaluation today. She states her pain is not nearly as bad as what it was which is also good news. She did have a wound culture which  was reviewed today that showed no growth of bacteria which is good news. This coupled with the fact that her wound appears to be healing better, measuring smaller, and overall even the slough buildup is minimal compared to what we have seen previously, I feel like she is actually showing signs of excellent improvement today. Natasha BassetHENRY, Donicia V. ([id number]) 09/19/2019 on evaluation today patient appears to be doing well with regard to her wounds on evaluation today. She is showing signs of continued improvement week by week and everything is measuring significantly smaller which is great news. Overall very pleased with the progress that she has made. 09/26/2019 on evaluation today patient appears to be doing fairly well with regard to  her right lower extremity ulcer. She does continue to have some drainage and again the smaller of the 2 wounds is not making as much progress as I like to see. We have been doing the silver collagen for some time I think we may want to switch to [facility] she has a little bit of hyper granular tissue this may help to keep things from becoming too dramatic in that regard. Fortunately there is no signs of infection at this time. Patient History Information obtained from Patient. Family History Heart Disease - Siblings,Father, No family history of Cancer, Diabetes, Hereditary Spherocytosis, Hypertension, Kidney Disease, Lung Disease, Seizures, Stroke, Thyroid Problems, Tuberculosis. Social History Never smoker, Alcohol Use - Daily, Drug Use - No History, Caffeine Use - Never. Medical History Eyes Patient has history of Cataracts, Glaucoma, Optic Neuritis Ear/Nose/Mouth/Throat Patient has history of Chronic sinus problems/congestion, Middle ear problems Medical And Surgical History Notes Constitutional Symptoms (General Health) Back pain Ear/Nose/Mouth/Throat bilateral hearing aides Review of Systems (ROS) Constitutional Symptoms (General Health) Denies  complaints or symptoms of Fatigue, Fever, Chills, Marked Weight Change. Respiratory Denies complaints or symptoms of Chronic or frequent coughs, Shortness of Breath. Cardiovascular Denies complaints or symptoms of Chest pain, LE edema. Psychiatric Denies complaints or symptoms of Anxiety, Claustrophobia. Objective Constitutional Well-nourished and well-hydrated in no acute distress. Vitals Time Taken: 12:59 PM, Height: 60 in, Weight: 125 lbs, BMI: 24.4, Temperature: 97.9 F, Pulse: 60 bpm, Respiratory DELORESE, SELLIN V. ([id number]) Rate: 16 breaths/min, Blood Pressure: 127/47 mmHg. Respiratory normal breathing without difficulty. clear to auscultation bilaterally. Cardiovascular regular rate and rhythm with normal S1, S2. Psychiatric this patient is able to make decisions and demonstrates good insight into disease process. Alert and Oriented x 3. pleasant and cooperative. General Notes: Patient's wound bed currently showed signs of good granulation at this time with only minimal hyper granulation and no significant slough buildup I was able to carefully debride this away with saline and gauze today without any complication. She tolerated that with minimal pain. Integumentary (Hair, Skin) Wound #11 status is Open. Original cause of wound was Trauma. The wound is located on the Right,Anterior Lower Leg. The wound measures 2.4cm length x 2.3cm width x 0.2cm depth; 4.335cm^2 area and 0.867cm^3 volume. There is Fat Layer (Subcutaneous Tissue) Exposed exposed. There is no tunneling or undermining noted. There is a medium amount of serous drainage noted. The wound margin is flat and intact. There is medium (34-66%) red granulation within the wound bed. There is a medium (34-66%) amount of necrotic tissue within the wound bed including Adherent Slough. Assessment Active Problems ICD-10 Unspecified open wound, right lower leg, initial encounter Non-pressure chronic ulcer of other part of right  lower leg with fat layer exposed Struck by dog, initial encounter Plan Wound Cleansing: Wound #11 Right,Anterior Lower Leg: Clean wound with Normal Saline. Primary Wound Dressing: Wound #11 Right,Anterior Lower Leg: Hydrafera Blue Ready Transfer Secondary Dressing: Wound #11 Right,Anterior Lower Leg: Conform/Kerlix Dressing Change Frequency: Wound #11 Right,Anterior Lower Leg: Change dressing every other day. Follow-up Appointments: Wound #11 Right,Anterior Lower Leg: SHUNDA, RABADI ([id number]) Return Appointment in 1 week. Edema Control: Wound #11 Right,Anterior Lower Leg: Other: - tubi grip F 1. I would recommend currently that we go ahead and switch to a Hydrofera Blue dressing and we will give this a try to see if that would be beneficial for her. 2. We will have her attempt to do the Tubigrip just from the ankle up to the base of her knee  and just wear a loose sock on her foot and see how this does. If her foot swells we will have to go back to covering the foot as well. 3. I would recommend she try to elevate her leg as much as possible when she is not up walking around. We will see patient back for reevaluation in 1 week here in the clinic. If anything worsens or changes patient will contact our office for additional recommendations. Electronic Signature(s) Signed: 09/26/2019 1:31:07 PM By: Lenda Kelp PA-C Entered By: Lenda Kelp on 09/26/2019 13:31:07 [name] ([id number]) -------------------------------------------------------------------------------- ROS/PFSH Details Patient Name: [name] Date of Service: 09/26/2019 1:00 PM Medical Record Number: [id number] Patient Account Number: [id number] Date of Birth/Sex: 08-Oct-1930 (85 y.o. F) Treating RN: Huel Coventry Primary Care Provider: Aram Beecham Other Clinician: Referring Provider: Aram Beecham Treating Provider/Extender: Linwood Dibbles, Angie Piercey Weeks in Treatment: 12 Information Obtained  From Patient Constitutional Symptoms (General Health) Complaints and Symptoms: Negative for: Fatigue; Fever; Chills; Marked Weight Change Medical History: Past Medical History Notes: Back pain Respiratory Complaints and Symptoms: Negative for: Chronic or frequent coughs; Shortness of Breath Cardiovascular Complaints and Symptoms: Negative for: Chest pain; LE edema Psychiatric Complaints and Symptoms: Negative for: Anxiety; Claustrophobia Eyes Medical History: Positive for: Cataracts; Glaucoma; Optic Neuritis Ear/Nose/Mouth/Throat Medical History: Positive for: Chronic sinus problems/congestion; Middle ear problems Past Medical History Notes: bilateral hearing aides HBO Extended History Items Ear/Nose/Mouth/Throat: Eyes: Eyes: Ear/Nose/Mouth/Throat: Chronic sinus Cataracts Glaucoma Middle ear problems problems/congestion Immunizations Pneumococcal Vaccine: Received Pneumococcal Vaccination: No Implantable Devices CHARLINA, DWIGHT ([id number]) Yes Family and Social History Cancer: No; Diabetes: No; Heart Disease: Yes - Siblings,Father; Hereditary Spherocytosis: No; Hypertension: No; Kidney Disease: No; Lung Disease: No; Seizures: No; Stroke: No; Thyroid Problems: No; Tuberculosis: No; Never smoker; Alcohol Use: Daily; Drug Use: No History; Caffeine Use: Never; Financial Concerns: No; Food, Clothing or Shelter Needs: No; Support System Lacking: No; Transportation Concerns: No Physician Affirmation I have reviewed and agree with the above information. Electronic Signature(s) Signed: 09/26/2019 5:55:45 PM By: Elliot Gurney, BSN, RN, CWS, Kim RN, BSN Signed: 09/26/2019 6:39:08 PM By: Lenda Kelp PA-C Entered By: Lenda Kelp on 09/26/2019 13:30:22 [name] ([id number]) -------------------------------------------------------------------------------- SuperBill Details Patient Name: [name] Date of Service: 09/26/2019 Medical Record Number:  [id number] Patient Account Number: [id number] Date of Birth/Sex: 02-14-1930 (85 y.o. F) Treating RN: Huel Coventry Primary Care Provider: Aram Beecham Other Clinician: Referring Provider: Aram Beecham Treating Provider/Extender: Linwood Dibbles, Jackie Littlejohn Weeks in Treatment: 12 Diagnosis Coding ICD-10 Codes Code Description S81.801A Unspecified open wound, right lower leg, initial encounter L97.812 Non-pressure chronic ulcer of other part of right lower leg with fat layer exposed W54.1XXA Struck by dog, initial encounter Facility Procedures CPT4 Code: [id number] Description: [phone] - WOUND CARE VISIT-LEV 2 EST PT Modifier: Quantity: 1 Physician Procedures CPT4 Code Description: [id number] - WC PHYS LEVEL 4 - EST PT ICD-10 Diagnosis Description S81.801A Unspecified open wound, right lower leg, initial encounter L97.812 Non-pressure chronic ulcer of other part of right lower leg wi W54.1XXA Struck by  dog, initial encounter Modifier: th fat layer expo Quantity: 1 sed Electronic Signature(s) Signed: 09/26/2019 5:48:51 PM By: Elliot Gurney, BSN, RN, CWS, Kim RN, BSN Signed: 09/26/2019 6:39:08 PM By: Lenda Kelp PA-C Previous Signature: 09/26/2019 1:31:20 PM Version By: Lenda Kelp PA-C Entered By: Elliot Gurney, BSN, RN, CWS, Kim on 09/26/2019 17:48:50

## 2019-10-02 ENCOUNTER — Other Ambulatory Visit: Payer: Self-pay

## 2019-10-02 ENCOUNTER — Ambulatory Visit: Payer: Medicare Other

## 2019-10-02 DIAGNOSIS — Z9181 History of falling: Secondary | ICD-10-CM

## 2019-10-02 DIAGNOSIS — M545 Low back pain, unspecified: Secondary | ICD-10-CM

## 2019-10-02 DIAGNOSIS — R262 Difficulty in walking, not elsewhere classified: Secondary | ICD-10-CM

## 2019-10-02 DIAGNOSIS — M6281 Muscle weakness (generalized): Secondary | ICD-10-CM

## 2019-10-02 DIAGNOSIS — R2681 Unsteadiness on feet: Secondary | ICD-10-CM

## 2019-10-02 DIAGNOSIS — G8929 Other chronic pain: Secondary | ICD-10-CM

## 2019-10-02 NOTE — Therapy (Signed)
[facility] [address], [place], [address] Phone: [phone]   Fax:  [phone]  Physical Therapy Treatment  Patient Details  Name: [name] MRN: [id number] Date of Birth: 1930-08-11 Referring Provider (PT): Jennings Books, MD   Encounter Date: 10/02/2019  PT End of Session - 10/02/19 1537    Visit Number  65    Number of Visits  110    Date for PT Re-Evaluation  10/10/19    Authorization Type  5    Authorization Time Period  10    PT Start Time  1537    PT Stop Time  1616    PT Time Calculation (min)  39 min    Activity Tolerance  Patient tolerated treatment well;Patient limited by fatigue    Behavior During Therapy  [facility] for tasks assessed/performed       Past Medical History:  Diagnosis Date  . Anemia   . GERD (gastroesophageal reflux disease)   . Hypertension   . Peripheral vascular disease (HCC)    possible neuropathies in lower extremeties  . PONV (postoperative nausea and vomiting)    happens sometimes but better with pre med of zofran  . Presence of permanent cardiac pacemaker   . Syncope     Past Surgical History:  Procedure Laterality Date  . ABDOMINAL HYSTERECTOMY  1969  . APPENDECTOMY  1969   with hysterectomy  . BACK SURGERY  2001   rods in back. surgery on back x 3  . COLONOSCOPY    . I&D EXTREMITY Left 03/27/2018   Procedure: IRRIGATION AND DEBRIDEMENT LEFT ELBOW / OLECRANON BURSA;  Surgeon: Corky Mull, MD;  Location: [facility] OR[facility];  Service: Orthopedics;  Laterality: Left;  . I&D EXTREMITY Left 06/05/2018   Procedure: IRRIGATION AND DEBRIDEMENT EXTREMITY;  Surgeon: Corky Mull, MD;  Location: [facility] OR[facility];  Service: Orthopedics;  Laterality: Left;  . INSERT / REPLACE / REMOVE PACEMAKER  2004  . JOINT REPLACEMENT Left 2004   partial knee replacement  . OLECRANON BURSECTOMY Left 03/27/2018   Procedure: LEFT OLECRANON BURSA;  Surgeon: Corky Mull, MD;  Location: [facility] OR[facility];   Service: Orthopedics;  Laterality: Left;  . PACEMAKER INSERTION  2014   dual chamber for complete heart block    There were no vitals filed for this visit.  Subjective Assessment - 10/02/19 1540    Subjective  Pt states having a problem in her R eye this morning. Just got back from the eye center. Her R eye was irritated. Adjusting her sleep number bed helped with her back pain. The jitters in her legs were bad yesteday. Not bad today.    Pertinent History  Imparied gait. Pt states that she has had balance issues for too long. Feels more comfortable walking with her rw. Had tests which showed that she has neuropathy. However the doctor at [facility] said that her balance is coming from her back. Just wants to be strong so she can walk.  Started walking with her rw off and on for the past several months.  Also feels like she might need a new sleep number bed. Also had 2 wound surgeries at [name]. Pt fell in February 2019. Had to stop PT due to the L elbow surgery April 06, 2018 to remove a bursa. The wound still has not healed.   Currently sees a wound doctor.   Feels numbness L foot from the ankle down.  No  R foot numbness.  Sometimes unsure of where her L LE is in space.  Pt also states that she tires too quickly.  Has not been able to walk her dog in 2 years. Has not really been doing exercises at home except the sit <> stand. Pt states that prolonged sitting without back support will increase back pain     Currently in Pain?  No/denies                               PT Education - 10/02/19 1543    Education provided  Yes    Education Details  ther-ex    [employer]) Educated  Patient    Methods  Explanation;Demonstration;Tactile cues;Verbal cues    Comprehension  Returned demonstration;Verbalized understanding      OBJECTIVE  Pt ambulating with rw   No latex band allergies    MedbridgeAccess Code: [email or web address]   Pt has pacemaker  Wound with  dressing R tibialis anterior muscle.  Therapeutic exercise:  Seated manually resisted trunk flexion isometrics in neutral 10x5 seconds for 3 sets  Decreased feeling of "jitteriness" with gait with rw  Seated B shoulder extension isometrics, hands on thighs 10x3 with 5 second holds   Seated hip extension isometrics               R 10x5 seconds for 3 sets              L 10x5 seconds for 3 sets  Seated hip adduction ball and glute max squeeze 10x3 with 5 second holds    Seated B ankle DF/PF 10x3 to promote LE neural mobility   Improved exercise technique, movement at target joints, use of target muscles after min to mod verbal, visual, tactile cues.    Patient response to treatment: Good muscle use felt with exercises.Pt tolerated session well without aggravation of symptoms.  Clinical Impression Worked on strengthening of trunk flexor muscles secondary to decreased symptoms of jitteriness reported by pt, as well as glute med and max strengthening to help decrease low back extension pressure. No change in level of "jitteriness" per pt with gait with rw after session. Pt will benefit from continued skilled physical therapy services to improve or maintain strength, function and ability to ambulate. Pt demonstrates tendency for functional regression when pt takes breaks from PT.        PT Short Term Goals - 04/25/19 1723      PT SHORT TERM GOAL #1   Title  Patient will be independent with her HEP to improve strength and balance.     Time  3    Period  Weeks    Status  On-going    Target Date  05/08/19        PT Long Term Goals - 09/03/19 1505      PT LONG TERM GOAL #1   Title  Patient will improve her 10 MWT to 0.8 m/s or more with rw to promote better community ambulation.     Baseline  0.5 seconds with rw (09/06/2018); 0.63 seconds average with rw (10/18/2018); 0.69 m/s average using rw (12/10/2018); 0.59 m/s using SPC and CGA (01/08/2019); 0.60 m/s using SPC  (01/21/2019); 0.56 m/s with SPC to no AD assist, CGA (02/04/2019); 0.45 m/s average (04/17/2019); 0.53 m/s with SPC (06/06/2019); 0.26 m/s average wiht SPC (08/13/2019); unable to assess today secondary to difficulty ambulating with [facility] today (09/03/2019)    Time  8    Period  Weeks    Status  On-going      PT LONG TERM GOAL #2   Title  Patient will improve her DGI score using rw or least restrictive AD to 19/24 or more to promote balance.     Baseline  13/24 using rw (09/06/2018); 14/24 using rw (10/29/2018); 16/24 (12/10/2018); 14/24 using SPC (01/21/2019); 9/24 using SPC (04/17/2019); 14/24 (06/11/2019; unable to complete test today secondary to pt request (08/13/2019); [facility] to assess today [facility])    Time  8    Period  Weeks    Status  On-going    Target Date  10/10/19      PT LONG TERM GOAL #3   Title  Patient will improve bilateral LE strength by at least 1/2 MMT to promote ability to support herself when performing standing tasks and improve balance.     Time  8    Period  Weeks    Status  On-going    Target Date  10/10/19      PT LONG TERM GOAL #4   Title  Patient will have a decrease in back pain/ache to 5/10 or less to promote ability to perform standing tasks with less difficulty.    Baseline  8/10 at most for the past 3 months (09/06/2018); 5-6/10 at worst for the past 7 days (10/18/2018); 5/10 at worst for the past 7 days (12/10/2018)    Time  6    Period  Weeks    Status  Achieved            Plan - 10/02/19 1542    Clinical Impression Statement  Worked on strengthening of trunk flexor muscles secondary to decreased symptoms of jitteriness reported by pt, as well as glute med and max strengthening to help decrease low back extension pressure. No change in level of "jitteriness" per pt with gait with rw after session. Pt will benefit from continued skilled physical therapy services to improve or maintain strength, function and  ability to ambulate. Pt demonstrates tendency for functional regression when pt takes breaks from PT.    Personal Factors and Comorbidities  Age;Comorbidity 3+;Time since onset of injury/illness/exacerbation;Past/Current Experience    Comorbidities  Pacemaker, L elbow and R tibialis anterior wounds with difficulty healing, back surgery    Examination-Activity Limitations  Carry;Locomotion Level;Stairs    Stability/Clinical Decision Making  Evolving/Moderate complexity    Rehab Potential  Fair    Clinical Impairments Affecting Rehab Potential  age, fear of falling, weakness    PT Frequency  2x / week    PT Duration  8 weeks    PT Treatment/Interventions  Aquatic Therapy;Gait training;Therapeutic activities;Therapeutic exercise;Balance training;Neuromuscular re-education;Patient/family education;Manual techniques;Dry needling    PT Next Visit Plan  trunk, hip, scapular strengthening, balance, gait, manual techniques PRN    Consulted and Agree with Plan of Care  Patient       Patient will benefit from skilled therapeutic intervention in order to improve the following deficits and impairments:  Abnormal gait, Decreased activity tolerance, Decreased balance, Decreased endurance, Decreased range of motion, Decreased strength, Difficulty walking, Improper body mechanics, Postural dysfunction, Pain, Decreased knowledge of use of DME  Visit Diagnosis: Difficulty in walking, not elsewhere classified  Muscle weakness (generalized)  History of falling  Unsteadiness on feet  Chronic low back pain, unspecified back pain laterality, unspecified whether sciatica present     Problem List Patient Active Problem List   Diagnosis  Date Noted  . Septic olecranon bursitis of left elbow 03/27/2018  . Lymphedema 10/11/2017  . Chronic venous insufficiency 10/11/2017  . Leg pain 10/11/2017  . Leg swelling 10/11/2017  . COPD (chronic obstructive pulmonary disease) (HCC) 10/11/2017  . Essential  hypertension 10/11/2017     Loralyn Freshwater PT, DPT    10/02/2019, 5:21 PM  [facility] [address] [place], [address] Phone: [phone]   Fax:  [phone]  Name: AYSLINN GROSHEK MRN: [id number] Date of Birth: Aug 20, 1930

## 2019-10-03 ENCOUNTER — Encounter: Payer: Medicare Other | Admitting: Physician Assistant

## 2019-10-03 DIAGNOSIS — L97819 Non-pressure chronic ulcer of other part of right lower leg with unspecified severity: Secondary | ICD-10-CM | POA: Diagnosis not present

## 2019-10-04 NOTE — Progress Notes (Signed)
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 improvement in the undermining from 7 to 9:00. Wound bed looks healthy. 09/26/18 Graffix #7 undermining is 0.5 cm maximally at roughly 8:00. From 12 to 7:00 the tissue is adherent which is a major improvement there is some advancing skin from this side. 10/03/18; Graphix #8 no major changes from last week 10/10/18 Graffix #9 There are improvements. There appears to be granulation coming up to the surface here and there is a lot less  undermining at 8:00. 10/17/18. Graffix #10; Dimensions are improved less undermining surface felt the but the wound is still open. Initially a surgical wound following a bursectomy 10/24/18; Graffix #11. This is really stalled over the last 2 weeks. If there is no further improvement this will be the last application.The final option for this difficult area would be plastic surgery and will set up a consult with Dr. Lorayne Bender in [facility] 10/31/18; wound looks about the same. The undermining superiorly is 0.7 cm. On the lateral edges perhaps some improvement there is no drainage. 11-07-2018 patient seen today for follow-up and management of left elbow wound. She has completed a total of 11 treatments of the graffix with not much improvement. She has an upcoming appointment with plastic surgery to assist with additional treatment options for the left elbow wound on 11/19/18. There is significant amount surrounding undermining of the wound is 0.9 cm. Currently prescribed methotrexate. Wound is being treated with Indoform and border dressing. No drainage from wound. No fever, chills. or pain. 11/21/18; the patient continues to have the wound looking roughly the same with undermining from about 12 to 6:00. This has not changed all that much. She does have skin irritation around the wound that looks like drainage maceration issues. The patient states that she was not able to have her wound dressing changed because of illness in the person he usually does this. She also did not attend her clinic appointment today with Dr. Lorayne Bender because of transportation issues. She is rebooked for some time in mid January 11/28/2018; the patient has less undermining using endoform. As a understandings she saw Dr. Doy Mince who is Dr. Rebekah Chesterfield partner. He recommended putting her in a elbow brace and I believe is written a prescription for it. He also recommended Motrin 800 mg 3 times daily. This is prescription  strength ibuprofen although he did not write his prescription. This apparently was for 2 weeks. Culture I did last time grew a few methicillin sensitive staph aureus. After some difficulty due to drug intolerances/allergies and drug interactions I settled on a 5-day course of azithromycin [name], [name] ([id number]) 12/03/18 on evaluation today patient actually appears to be doing fairly well in regard to her elbow when compared to last time I evaluated her.  With that being said there does not appear to be any signs of infection at this time. That was the big concern currently as far as the patient was concerned. Nonetheless I do feel like she is making progress in regard to the feeling of this ulcer it has been slow. She did see a Engineer, petroleum they are talking about putting her in a brace in order to allow this area to heal more appropriately. 12/19/2018; not much change in this from the last time I have saw this.'s much smaller area than when she first came in and with less circumferential undermining however this is never really adhered. She is wearing the brace that was given or prescribed to her by plastics. She did not have a procedure offered to attempt to close this. We have been using endoform 1/15; wound actually is not doing as well as last week. She was actually not supposed to come into this clinic again until next week but apparently her attendant noticed some redness increasing pain and she came in early. She reports the same amount of drainage. We have been using endoform. She is approved through puraply however I will only consider starting that next week 1/22; she completed the antibiotics last week. Culture I did was negative. In spite of this there is less erythema and pain complaints in the wound. Puraply #1 applied today 1/29; Puraply #2 today. Wound surface looks a lot better post debridement of adherent fibrinous material. However undermining from 6-12 is measuring  worse 2/5; Puraply #3. Using her elbow brace 2/12 puraply #4 2/19 puraply #5. The 9:00 undermining measured at 0.5 cm. Undermining from 4-11 o'clock. Surface of the wound looks better and the circumference of the wound is smaller however the undermining is not really changed 2/26; still not much improvement. She has undermining from 4-9 o'clock 0.9 cm. Surface of the wound covered and adherent debris. 3/4; still no improvement. Undermining from 4-9 o'clock still around a centimeter. Surface of the wound looks somewhat better. No debridement is required we used endoform after we ended the trial of puraply last week 3/11; really no improvement at all. Still 1 cm undermining from roughly 9-3 o'clock. This is about a centimeter. The base of the wound looks fairly healthy. No debridement. We have been using endoform. I am really out of most usual options here. I could consider either another round of an amniotic advanced treatment product example epifix or perhaps regranex. Understandably the patient is a bit frustrated. We did send her to plastic surgery for a consult. Other than prescribing her a brace to immobilize the elbow they did not think she was a candidate for any further surgery. Notable that the patient is not using the brace today 3/18-Patient returns for attention to the left elbow area which apparently looked red at the home health visit. Patient's elbow looks the same if not better compared with last visit. The area of ulceration remains the same, the base appears healthy. We are continuing to use endoform she has been encouraged to use the brace to keep the elbow straight 3/25; the patient has an appointment at the [facility] wound care center on 4/2. I had actually put her out indefinitely however she seems to want to come back here every week. This week she complains of increased pain and malodor. Dimensions of the elbow wound are larger. We have been using endoform 4/8; the patient went  to [facility] where they apparently gave her meta honey and some border  foam with a Tubigrip. She has been using this for a week. She says she was very impressed with them there. They did not offer her any surgical consultation. She seems to be coming back here for a second opinion on this, she does not wish to drive to [facility] every week 1/61; still using Medihoney foam border and a Tubigrip. Actually do not think she has anything on the arm at all specifically she is not using her brace 5/6; she has been using medihoney without a lot of improvement. Undermining maximum at 9:00 at 0.5 cm may be somewhat better. She is still complaining of discomfort. She uses her elbow brace at night but is not using anything on the arm during the day, she finds it too restrictive 5/13; we switch the patient to endoform AG last week. She is complaining of more pain and worried about some circumferential erythema around the wound. I had planned to consider epifix in this wound however the patient came in with a request to see a plastic surgeon in [place] by the name of Dr. Wyline Mood who cared for a friend of hers. Noteworthy that I have already sent her to one plastic surgeon and she went for another second opinion at [facility] and they apparently did not send her to a plastic surgeon nevertheless the patient is fairly convinced that she might benefit from a skin graft which I am doubtful. She also has rheumatoid arthritis and is on methotrexate. This was originally a surgical wound for a bursectomy a year or 2 ago Readmission: 06/28/19 on evaluation today patient presents today for reevaluation but this is due to a Natasha issue her elbow has completely healed and looks excellent. Her right anterior lower leg has a skin tear which was sustained from her dog who jumped up on her and inadvertently scratch the area causing the skin tear. This happened yesterday. She is having some discomfort but fortunately nothing too significant  which is good news. No fevers, chills, nausea, or vomiting noted at this time. The skin fortunately was knocked one completely off and we are gonna see about we approximate in the skin as best we can in using Steri-Strips to hold this in place obviously if we can get some of this to reattach that would be beneficial [name], [name]. ([id number]) 07/05/2019 on evaluation today patient actually appears to be doing okay although unfortunately the skin flap that we were attempting to Steri-Stripped down last week did not take. She is developing a lot of fluid underneath the wound area unfortunately which again is not ideal. I think that this necrotic tissue needs to be removed and again it actually just wiped off during the evaluation today as I was attempting to clean the wound there did not appear to be any significant issues underlying which is good although there was some purulent drainage I did want to go ahead and see about obtaining a culture from today in order to ensure that the Z-Pak that I placed her on earlier in the week was appropriate for treating what ever infection may be causing the issue currently. This was a deep wound culture obtained today. 07/12/2019 on evaluation today patient actually appears to be doing quite well with regard to her right lower extremity ulcer all things considering. There is still little bit of hematoma not it that is noted in the central portion of the wound along with some necrotic tissue but she is still having quite a bit of discomfort. For that  reason I did not perform sharp debridement today although this wound does need some debridement of one type or another. I think we may attempt Iodoflex to see if this can be of benefit. Fortunately there is no signs of active infection at this point. 07/18/19 upon evaluation today patient appears to be doing better with regard to her ulcer on her right lower extremity. She's been tolerating the dressing changes without  complication. The good news is she seems to be making excellent progress. Overall I'm pleased with there being no signs of infection. With that being said she does tell me that she's been having some discomfort with the wrap she's unsure of exactly what about the rafters causing her trouble. 07/25/2019 on evaluation today patient appears to be doing a little better in regard to her lower extremity ulcer. Unfortunately the alginate seems to be getting really stuck to the wound bed and surrounding periwound. Fortunately there is no signs of active infection at this time. There does not appear to be any evidence of infection currently. 08/01/2019 on evaluation today patient actually appears to be doing much better with regard to her right lower extremity ulcer. She has been tolerating the dressing changes without complication. Fortunately her wound does not show any signs of infection and seems to be making good progress. No fevers, chills, nausea, vomiting, or diarrhea. 08/08/2019 on evaluation today patient actually appears to be doing better with regard to her leg ulcer. She has been tolerating the dressing changes without complication. With that being said I think we may want to switch the dressing up today just based on the appearance of the wound bed in general. Fortunately there is no evidence of infection currently. Her pain seems to be much better. Her son is present with her today. 08/15/2019 on evaluation today patient appears to be doing very well with regard to her right lower extremity ulcer. She still complains about feeling like the compression stocking/Tubigrip is too tight for her. Nonetheless I still think this is beneficial and is helping the wound to heal more effectively. Overall I am extremely pleased at this time with what I am seeing. 08/22/2019 on evaluation today patient actually appears to be doing quite well with regard to her right lower extremity. She has been tolerating the  dressing changes without complication. Fortunately there is no signs of active infection at this time. She still complains about the Tubigrip but nonetheless I think this is something that is of utmost importance for her to continue to wear if she is going to see this area heal appropriately. 08/29/2019 on evaluation today patient actually appears to be doing well visually in regard to her wound. Unfortunately in regard to overall pain she seems to be having more pain today which I am somewhat concerned about. There does not appear to be any signs of active infection that I can tell but again with increased pain that is definitely a concern here today. I do think it may be time to switch up the dressing as well to something that will be a little bit more effective hopefully and Natasha tissue growth she has done well with collagen in the past. 09/05/2019 on evaluation today patient actually appears to be doing well with regard to her leg ulcer. She has been tolerating the dressing changes without complication. Fortunately there is no signs of active infection. We did obtain a wound culture last week due to the fact that she still is very touchy as far as  the surface of the wound is concerned. Unfortunately I am not sure what happened to that culture as there is no record in the computer system. Nonetheless we need to follow-up on this and try to see if we can identify what exactly happened with this specimen as we have not gotten a result back either obviously. 09/12/2019 on evaluation today patient actually appears to be showing signs of good improvement upon evaluation today. She states her pain is not nearly as bad as what it was which is also good news. She did have a wound culture which was reviewed today that showed no growth of bacteria which is good news. This coupled with the fact that her wound appears to be healing better, measuring smaller, and overall even the slough buildup is minimal  compared to what we have seen previously, I feel like she is actually showing signs of excellent improvement today. Natasha BassetHENRY, Demitra V. ([id number]) 09/19/2019 on evaluation today patient appears to be doing well with regard to her wounds on evaluation today. She is showing signs of continued improvement week by week and everything is measuring significantly smaller which is great news. Overall very pleased with the progress that she has made. 09/26/2019 on evaluation today patient appears to be doing fairly well with regard to her right lower extremity ulcer. She does continue to have some drainage and again the smaller of the 2 wounds is not making as much progress as I like to see. We have been doing the silver collagen for some time I think we may want to switch to [facility] Incydrofera Blue she has a little bit of hyper granular tissue this may help to keep things from becoming too dramatic in that regard. Fortunately there is no signs of infection at this time. 10/03/2019 on evaluation today patient appears to be doing fairly well with regard to her lower extremity ulcers at this time. She has been tolerating the dressing changes without complication. Fortunately the wound is measuring smaller which is great news she continues to make great progress. Patient History Information obtained from Patient. Family History Heart Disease - Siblings,Father, No family history of Cancer, Diabetes, Hereditary Spherocytosis, Hypertension, Kidney Disease, Lung Disease, Seizures, Stroke, Thyroid Problems, Tuberculosis. Social History Never smoker, Alcohol Use - Daily, Drug Use - No History, Caffeine Use - Never. Medical History Eyes Patient has history of Cataracts, Glaucoma, Optic Neuritis Ear/Nose/Mouth/Throat Patient has history of Chronic sinus problems/congestion, Middle ear problems Medical And Surgical History Notes Constitutional Symptoms (General Health) Back pain Ear/Nose/Mouth/Throat bilateral hearing  aides Review of Systems (ROS) Constitutional Symptoms (General Health) Denies complaints or symptoms of Fatigue, Fever, Chills, Marked Weight Change. Respiratory Denies complaints or symptoms of Chronic or frequent coughs, Shortness of Breath. Cardiovascular Denies complaints or symptoms of Chest pain, LE edema. Psychiatric Denies complaints or symptoms of Anxiety, Claustrophobia. [address]Objective Natasha BassetHENRY, Bryer V. ([id number]) Constitutional Well-nourished and well-hydrated in no acute distress. Vitals Time Taken: 11:05 AM, Height: 60 in, Weight: 125 lbs, BMI: 24.4, Temperature: 97.8 F, Pulse: 60 bpm, Respiratory Rate: 16 breaths/min, Blood Pressure: 120/56 mmHg. Respiratory normal breathing without difficulty. clear to auscultation bilaterally. Cardiovascular regular rate and rhythm with normal S1, S2. Psychiatric this patient is able to make decisions and demonstrates good insight into disease process. Alert and Oriented x 3. pleasant and cooperative. General Notes: Patient's wound bed again showed some signs of necrotic tissue on the surface of the wound that did require sharp debridement today. I was able to remove the necrotic tissue without complication  by way of debridement she tolerated this without complication and post debridement the wound bed appears to be doing much better. Integumentary (Hair, Skin) Wound #11 status is Open. Original cause of wound was Trauma. The wound is located on the Right,Anterior Lower Leg. The wound measures 1.6cm length x 2.2cm width x 0.2cm depth; 2.765cm^2 area and 0.553cm^3 volume. There is Fat Layer (Subcutaneous Tissue) Exposed exposed. There is no tunneling or undermining noted. There is a medium amount of serous drainage noted. The wound margin is flat and intact. There is medium (34-66%) red granulation within the wound bed. There is a medium (34-66%) amount of necrotic tissue within the wound bed including Adherent Slough. Assessment Active  Problems ICD-10 Unspecified open wound, right lower leg, initial encounter Non-pressure chronic ulcer of other part of right lower leg with fat layer exposed Struck by dog, initial encounter Procedures Wound #11 Pre-procedure diagnosis of Wound #11 is a Skin Tear located on the Right,Anterior Lower Leg . There was a Excisional Skin/Subcutaneous Tissue Debridement with a total area of 3.52 sq cm performed by STONE III, Cyrilla Durkin E., PA-C. With the following instrument(s): Curette to remove Viable and Non-Viable tissue/material. Material removed includes Subcutaneous Tissue and Slough and after achieving pain control using Lidocaine. A time out was conducted at 11:38, prior to the start of the procedure. A Minimum amount of bleeding was controlled with Pressure. The procedure was tolerated well. Post Debridement Measurements: 1.6cm length x 2.2cm width x 0.1cm depth; 0.276cm^3 volume. Character of Wound/Ulcer Post Debridement is stable. Post procedure Diagnosis Wound #11: Same as Pre-Procedure [name], [name] ([id number]) Plan Wound Cleansing: Wound #11 Right,Anterior Lower Leg: Clean wound with Normal Saline. Primary Wound Dressing: Wound #11 Right,Anterior Lower Leg: Hydrafera Blue Ready Transfer Secondary Dressing: Wound #11 Right,Anterior Lower Leg: Conform/Kerlix Dressing Change Frequency: Wound #11 Right,Anterior Lower Leg: Change dressing every other day. Follow-up Appointments: Wound #11 Right,Anterior Lower Leg: Return Appointment in 2 weeks. Edema Control: Wound #11 Right,Anterior Lower Leg: Other: - tubi grip F 1. I would recommend currently that we go ahead and continue with the current measures with regard to the patient's wound care since she seems to be doing well and she is in agreement with this plan. This includes the Hydrofera Blue dressing to the wound. 2. I would recommend we continue to cover this with Kerlix and then subsequently change the dressing every other  day utilizing Tubigrip to hold everything in place. 3. I still recommend that she elevate her leg is much as possible to help with the edema control. We will see patient back for reevaluation in 1 week here in the clinic. If anything worsens or changes patient will contact our office for additional recommendations. Electronic Signature(s) Signed: 10/03/2019 1:35:21 PM By: Lenda Kelp PA-C Entered By: Lenda Kelp on 10/03/2019 13:35:20 [name] ([id number]) -------------------------------------------------------------------------------- ROS/PFSH Details Patient Name: [name] Date of Service: 10/03/2019 11:00 AM Medical Record Number: [id number] Patient Account Number: [id number] Date of Birth/Sex: 1930/11/15 (85 y.o. F) Treating RN: Rodell Perna Primary Care Provider: Aram Beecham Other Clinician: Referring Provider: Aram Beecham Treating Provider/Extender: Linwood Dibbles, Bradey Luzier Weeks in Treatment: 13 Information Obtained From Patient Constitutional Symptoms (General Health) Complaints and Symptoms: Negative for: Fatigue; Fever; Chills; Marked Weight Change Medical History: Past Medical History Notes: Back pain Respiratory Complaints and Symptoms: Negative for: Chronic or frequent coughs; Shortness of Breath Cardiovascular Complaints and Symptoms: Negative for: Chest pain; LE edema Psychiatric Complaints and Symptoms: Negative for: Anxiety; Claustrophobia Eyes  Medical History: Positive for: Cataracts; Glaucoma; Optic Neuritis Ear/Nose/Mouth/Throat Medical History: Positive for: Chronic sinus problems/congestion; Middle ear problems Past Medical History Notes: bilateral hearing aides HBO Extended History Items Ear/Nose/Mouth/Throat: Eyes: Eyes: Ear/Nose/Mouth/Throat: Chronic sinus Cataracts Glaucoma Middle ear problems problems/congestion Immunizations Pneumococcal Vaccine: Received Pneumococcal Vaccination: No Implantable Devices [name], [name] ([id number]) Yes Family and Social History Cancer: No; Diabetes: No; Heart Disease: Yes - Siblings,Father; Hereditary Spherocytosis: No; Hypertension: No; Kidney Disease: No; Lung Disease: No; Seizures: No; Stroke: No; Thyroid Problems: No; Tuberculosis: No; Never smoker; Alcohol Use: Daily; Drug Use: No History; Caffeine Use: Never; Financial Concerns: No; Food, Clothing or Shelter Needs: No; Support System Lacking: No; Transportation Concerns: No Physician Affirmation I have reviewed and agree with the above information. Electronic Signature(s) Signed: 10/03/2019 4:16:03 PM By: Rodell Perna Signed: 10/03/2019 6:36:47 PM By: Lenda Kelp PA-C Entered By: Lenda Kelp on 10/03/2019 13:34:26 [name] ([id number]) -------------------------------------------------------------------------------- SuperBill Details Patient Name: [name] Date of Service: 10/03/2019 Medical Record Number: [id number] Patient Account Number: [id number] Date of Birth/Sex: [birth year] (85 y.o. F) Treating RN: Rodell Perna Primary Care Provider: Aram Beecham Other Clinician: Referring Provider: Aram Beecham Treating Provider/Extender: Linwood Dibbles, Jagger Beahm Weeks in Treatment: 13 Diagnosis Coding ICD-10 Codes Code Description S81.801A Unspecified open wound, right lower leg, initial encounter L97.812 Non-pressure chronic ulcer of other part of right lower leg with fat layer exposed W54.1XXA Struck by dog, initial encounter Facility Procedures CPT4 Code Description: [id number] [phone] - DEB SUBQ TISSUE 20 SQ CM/< ICD-10 Diagnosis Description L97.812 Non-pressure chronic ulcer of other part of right lower leg wit Modifier: h fat layer expo Quantity: 1 sed Physician Procedures CPT4 Code Description: [id number] 11042 - WC PHYS SUBQ TISS 20 SQ CM ICD-10 Diagnosis Description L97.812 Non-pressure chronic ulcer of other part of right lower leg wit Modifier: h fat layer expo Quantity: 1  sed Electronic Signature(s) Signed: 10/03/2019 1:36:20 PM By: Lenda Kelp PA-C Entered By: Lenda Kelp on 10/03/2019 13:36:20

## 2019-10-05 NOTE — Progress Notes (Signed)
[name], Dilana V. ([id number]) Visit Report for 10/03/2019 Arrival Information Details Patient Name: [name], Ethie V. Date of Service: 10/03/2019 11:00 AM Medical Record Number: [id number] Patient Account Number: [id number] Date of Birth/Sex: 10/20/1930 (85 y.o. F) Treating RN: Rodell PernaScott, Dajea Primary Care Sevan Mcbroom: Aram BeechamSparks, Jeffrey Other Clinician: Referring Nyhla Mountjoy: Aram BeechamSparks, Jeffrey Treating Daniela Siebers/Extender: Linwood DibblesSTONE III, HOYT Weeks in Treatment: 13 Visit Information History Since Last Visit Added or deleted any medications: Yes Patient Arrived: Walker Any new allergies or adverse reactions: No Arrival Time: 11:09 Had a fall or experienced change in No Accompanied By: self activities of daily living that may affect Transfer Assistance: None risk of falls: Patient Identification Verified: Yes Signs or symptoms of abuse/neglect since last visito No Secondary Verification Process Completed: Yes Hospitalized since last visit: No Patient Requires Transmission-Based Precautions: No Implantable device outside of the clinic excluding No Patient Has Alerts: No cellular tissue based products placed in the center since last visit: Has Dressing in Place as Prescribed: Yes Pain Present Now: No Electronic Signature(s) Signed: 10/03/2019 4:02:16 PM By: Dayton MartesWallace, RCP,RRT,CHT, Sallie RCP, RRT, CHT Entered By: Dayton MartesWallace, RCP,RRT,CHT, Sallie on 10/03/2019 11:10:28 [name], Satomi V. ([id number]) -------------------------------------------------------------------------------- Encounter Discharge Information Details Patient Name: [name], Cing V. Date of Service: 10/03/2019 11:00 AM Medical Record Number: [id number] Patient Account Number: [id number] Date of Birth/Sex: 05/18/1930 (85 y.o. F) Treating RN: Rodell PernaScott, Dajea Primary Care Grizelda Piscopo: Aram BeechamSparks, Jeffrey Other Clinician: Referring Cyla Haluska: Aram BeechamSparks, Jeffrey Treating Aleighna Wojtas/Extender: Linwood DibblesSTONE III, HOYT Weeks in Treatment: 7313 Encounter Discharge Information  Items Post Procedure Vitals Discharge Condition: Stable Temperature (F): 97.8 Ambulatory Status: Walker Pulse (bpm): 60 Discharge Destination: Home Respiratory Rate (breaths/min): 16 Transportation: Private Auto Blood Pressure (mmHg): 120/56 Accompanied By: self Schedule Follow-up Appointment: Yes Clinical Summary of Care: Electronic Signature(s) Signed: 10/03/2019 1:28:54 PM By: Rodell PernaScott, Dajea Entered By: Rodell PernaScott, Dajea on 10/03/2019 11:42:10 [name], Victora V. ([id number]) -------------------------------------------------------------------------------- Lower Extremity Assessment Details Patient Name: [name], Jasmia V. Date of Service: 10/03/2019 11:00 AM Medical Record Number: [id number] Patient Account Number: [id number] Date of Birth/Sex: 02/10/1930 (85 y.o. F) Treating RN: Huel CoventryWoody, Kim Primary Care Phillips Goulette: Aram BeechamSparks, Jeffrey Other Clinician: Referring Kathrine Rieves: Aram BeechamSparks, Jeffrey Treating Jann Ra/Extender: Linwood DibblesSTONE III, HOYT Weeks in Treatment: 13 Edema Assessment Assessed: [Left: No] [Right: Yes] Edema: [Left: Ye] [Right: s] Calf Left: Right: Point of Measurement: 30 cm From Medial Instep cm 34 cm Ankle Left: Right: Point of Measurement: 10 cm From Medial Instep cm 23 cm Vascular Assessment Pulses: Dorsalis Pedis Palpable: [Right:Yes] Electronic Signature(s) Signed: 10/04/2019 5:23:28 PM By: Elliot GurneyWoody, BSN, RN, CWS, Kim RN, BSN Entered By: Elliot GurneyWoody, BSN, RN, CWS, Kim on 10/03/2019 11:19:09 [name], Jeraldean V. ([id number]) -------------------------------------------------------------------------------- Multi Wound Chart Details Patient Name: [name], Lajuanda V. Date of Service: 10/03/2019 11:00 AM Medical Record Number: [id number] Patient Account Number: [id number] Date of Birth/Sex: 08/25/1930 (85 y.o. F) Treating RN: Rodell PernaScott, Dajea Primary Care Aurilla Coulibaly: Aram BeechamSparks, Jeffrey Other Clinician: Referring Shaune Malacara: Aram BeechamSparks, Jeffrey Treating Brettney Ficken/Extender: Linwood DibblesSTONE III, HOYT Weeks in Treatment:  13 Vital Signs Height(in): 60 Pulse(bpm): 60 Weight(lbs): 125 Blood Pressure(mmHg): 120/56 Body Mass Index(BMI): 24 Temperature(F): 97.8 Respiratory Rate 16 (breaths/min): Photos: [N/A:N/A] Wound Location: Right Lower Leg - Anterior N/A N/A Wounding Event: Trauma N/A N/A Primary Etiology: Skin Tear N/A N/A Comorbid History: Cataracts, Glaucoma, Optic N/A N/A Neuritis, Chronic sinus problems/congestion, Middle ear problems Date Acquired: 06/27/2019 N/A N/A Weeks of Treatment: 13 N/A N/A Wound Status: Open N/A N/A Measurements L x W x D 1.6x2.2x0.2 N/A N/A (cm) Area (cm) : 2.765 N/A N/A Volume (cm) : 0.553 N/A N/A % Reduction  in Area: 74.10% N/A N/A % Reduction in Volume: 89.60% N/A N/A Classification: Full Thickness Without N/A N/A Exposed Support Structures Exudate Amount: Medium N/A N/A Exudate Type: Serous N/A N/A Exudate Color: amber N/A N/A Wound Margin: Flat and Intact N/A N/A Granulation Amount: Medium (34-66%) N/A N/A Granulation Quality: Red N/A N/A Necrotic Amount: Medium (34-66%) N/A N/A Exposed Structures: Fat Layer (Subcutaneous N/A N/A Tissue) Exposed: Yes Fascia: No Tendon: No BINDI, KLOMP ([id number]) Muscle: No Joint: No Bone: No Epithelialization: None N/A N/A Treatment Notes Electronic Signature(s) Signed: 10/03/2019 1:28:54 PM By: Army Melia Entered By: Army Melia on 10/03/2019 11:35:50 [name] ([id number]) -------------------------------------------------------------------------------- [place] Details Patient Name: [name] Date of Service: 10/03/2019 11:00 AM Medical Record Number: [id number] Patient Account Number: [id number] Date of Birth/Sex: [birth year] (85 y.o. F) Treating RN: Army Melia Primary Care Jannis Atkins: Fulton Reek Other Clinician: Referring Gaege Sangalang: Fulton Reek Treating Chessie Neuharth/Extender: Melburn Hake, HOYT Weeks in Treatment: 8 Active Inactive Abuse / Safety / Falls /  Self Care Management Nursing Diagnoses: Potential for falls Goals: Patient will remain injury free related to falls Date Initiated: 06/28/2019 Target Resolution Date: 09/21/2019 Goal Status: Active Interventions: Assess fall risk on admission and as needed Notes: Wound/Skin Impairment Nursing Diagnoses: Impaired tissue integrity Goals: Ulcer/skin breakdown will heal within 14 weeks Date Initiated: 06/28/2019 Target Resolution Date: 09/21/2019 Goal Status: Active Interventions: Assess patient/caregiver ability to obtain necessary supplies Assess patient/caregiver ability to perform ulcer/skin care regimen upon admission and as needed Assess ulceration(s) every visit Notes: Electronic Signature(s) Signed: 10/03/2019 1:28:54 PM By: Army Melia Entered By: Army Melia on 10/03/2019 11:36:50 [name] ([id number]) -------------------------------------------------------------------------------- Pain Assessment Details Patient Name: [name] Date of Service: 10/03/2019 11:00 AM Medical Record Number: [id number] Patient Account Number: [id number] Date of Birth/Sex: 1930-11-28 (85 y.o. F) Treating RN: Army Melia Primary Care Lashante Fryberger: Fulton Reek Other Clinician: Referring Keita Demarco: Fulton Reek Treating Rainer Mounce/Extender: Melburn Hake, HOYT Weeks in Treatment: 13 Active Problems Location of Pain Severity and Description of Pain Patient Has Paino No Site Locations Pain Management and Medication Current Pain Management: Electronic Signature(s) Signed: 10/03/2019 1:28:54 PM By: Army Melia Signed: 10/03/2019 4:02:16 PM By: Lorine Bears RCP, RRT, CHT Entered By: Lorine Bears on 10/03/2019 11:10:38 [name] ([id number]) -------------------------------------------------------------------------------- Patient/Caregiver Education Details Patient Name: [name] Date of Service: 10/03/2019 11:00 AM Medical Record  Number: [id number] Patient Account Number: [id number] Date of Birth/Gender: April 03, 1930 (85 y.o. F) Treating RN: Army Melia Primary Care Physician: Fulton Reek Other Clinician: Referring Physician: Fulton Reek Treating Physician/Extender: Sharalyn Ink in Treatment: 13 Education Assessment Education Provided To: Patient Education Topics Provided Wound/Skin Impairment: Handouts: Caring for Your Ulcer Methods: Demonstration, Explain/Verbal Responses: State content correctly Electronic Signature(s) Signed: 10/03/2019 1:28:54 PM By: Army Melia Entered By: Army Melia on 10/03/2019 11:41:22 [name] ([id number]) -------------------------------------------------------------------------------- Wound Assessment Details Patient Name: [name] Date of Service: 10/03/2019 11:00 AM Medical Record Number: [id number] Patient Account Number: [id number] Date of Birth/Sex: 1930/05/24 (85 y.o. F) Treating RN: Cornell Barman Primary Care Giah Fickett: Fulton Reek Other Clinician: Referring Candita Borenstein: Fulton Reek Treating Johnavon Mcclafferty/Extender: Melburn Hake, HOYT Weeks in Treatment: 13 Wound Status Wound Number: 11 Primary Skin Tear Etiology: Wound Location: Right Lower Leg - Anterior Wound Open Wounding Event: Trauma Status: Date Acquired: 06/27/2019 Comorbid Cataracts, Glaucoma, Optic Neuritis, Chronic Weeks Of Treatment: 13 History: sinus problems/congestion, Middle ear problems Clustered Wound: No Photos Wound Measurements Length: (cm) 1.6 Width: (cm) 2.2 Depth: (cm) 0.2 Area: (  cm) 2.765 Volume: (cm) 0.553 % Reduction in Area: 74.1% % Reduction in Volume: 89.6% Epithelialization: None Tunneling: No Undermining: No Wound Description Full Thickness Without Exposed Support Classification: Structures Wound Margin: Flat and Intact Exudate Medium Amount: Exudate Type: Serous Exudate Color: amber Foul Odor After Cleansing: No Slough/Fibrino Yes Wound  Bed Granulation Amount: Medium (34-66%) Exposed Structure Granulation Quality: Red Fascia Exposed: No Necrotic Amount: Medium (34-66%) Fat Layer (Subcutaneous Tissue) Exposed: Yes Necrotic Quality: Adherent Slough Tendon Exposed: No Muscle Exposed: No Joint Exposed: No Bone Exposed: No DANYALE, RIDINGER ([id number]) Treatment Notes Wound #11 (Right, Anterior Lower Leg) Notes hydrafera, conform, tubi grip F Electronic Signature(s) Signed: 10/04/2019 5:23:28 PM By: Elliot Gurney, BSN, RN, CWS, Kim RN, BSN Entered By: Elliot Gurney, BSN, RN, CWS, Kim on 10/03/2019 11:17:59 Natasha Basset ([id number]) -------------------------------------------------------------------------------- Vitals Details Patient Name: Natasha Basset Date of Service: 10/03/2019 11:00 AM Medical Record Number: [id number] Patient Account Number: [id number] Date of Birth/Sex: 08-Dec-1930 (85 y.o. F) Treating RN: Rodell Perna Primary Care Devon Pretty: Aram Beecham Other Clinician: Referring Cayci Mcnabb: Aram Beecham Treating Hiilei Gerst/Extender: Linwood Dibbles, HOYT Weeks in Treatment: 13 Vital Signs Time Taken: 11:05 Temperature (F): 97.8 Height (in): 60 Pulse (bpm): 60 Weight (lbs): 125 Respiratory Rate (breaths/min): 16 Body Mass Index (BMI): 24.4 Blood Pressure (mmHg): 120/56 Reference Range: 80 - 120 mg / dl Electronic Signature(s) Signed: 10/03/2019 4:02:16 PM By: Dayton Martes RCP, RRT, CHT Entered By: Dayton Martes on 10/03/2019 11:11:50

## 2019-10-09 DIAGNOSIS — E538 Deficiency of other specified B group vitamins: Secondary | ICD-10-CM

## 2019-10-09 HISTORY — DX: Deficiency of other specified B group vitamins: E53.8

## 2019-10-10 ENCOUNTER — Ambulatory Visit: Payer: Medicare Other | Admitting: Internal Medicine

## 2019-10-16 ENCOUNTER — Other Ambulatory Visit: Payer: Self-pay

## 2019-10-16 ENCOUNTER — Ambulatory Visit: Payer: Medicare Other | Attending: Neurology

## 2019-10-16 DIAGNOSIS — Z9181 History of falling: Secondary | ICD-10-CM | POA: Diagnosis present

## 2019-10-16 DIAGNOSIS — M545 Low back pain: Secondary | ICD-10-CM | POA: Diagnosis present

## 2019-10-16 DIAGNOSIS — M6281 Muscle weakness (generalized): Secondary | ICD-10-CM | POA: Diagnosis present

## 2019-10-16 DIAGNOSIS — R2681 Unsteadiness on feet: Secondary | ICD-10-CM | POA: Insufficient documentation

## 2019-10-16 DIAGNOSIS — R262 Difficulty in walking, not elsewhere classified: Secondary | ICD-10-CM | POA: Insufficient documentation

## 2019-10-16 DIAGNOSIS — G8929 Other chronic pain: Secondary | ICD-10-CM | POA: Diagnosis present

## 2019-10-16 NOTE — Therapy (Signed)
[facility] [address], [place], [address] Phone: [phone]   Fax:  [phone]  Physical Therapy Treatment  Patient Details  Name: [name] MRN: [id number] Date of Birth: [birth year] Referring Provider (PT): Jennings Books, MD   Encounter Date: 10/16/2019  PT End of Session - 10/16/19 1505    Visit Number  33    Number of Visits  122    Date for PT Re-Evaluation  11/28/19    Authorization Type  6    Authorization Time Period  10    PT Start Time  1505   pt late   PT Stop Time  1601    PT Time Calculation (min)  56 min    Activity Tolerance  Patient tolerated treatment well;Patient limited by fatigue    Behavior During Therapy  [facility] for tasks assessed/performed       Past Medical History:  Diagnosis Date  . Anemia   . GERD (gastroesophageal reflux disease)   . Hypertension   . Peripheral vascular disease (HCC)    possible neuropathies in lower extremeties  . PONV (postoperative nausea and vomiting)    happens sometimes but better with pre med of zofran  . Presence of permanent cardiac pacemaker   . Syncope     Past Surgical History:  Procedure Laterality Date  . ABDOMINAL HYSTERECTOMY  1969  . APPENDECTOMY  1969   with hysterectomy  . BACK SURGERY  2001   rods in back. surgery on back x 3  . COLONOSCOPY    . I&D EXTREMITY Left 03/27/2018   Procedure: IRRIGATION AND DEBRIDEMENT LEFT ELBOW / OLECRANON BURSA;  Surgeon: Corky Mull, MD;  Location: [facility] ORS;  Service: Orthopedics;  Laterality: Left;  . I&D EXTREMITY Left 06/05/2018   Procedure: IRRIGATION AND DEBRIDEMENT EXTREMITY;  Surgeon: Corky Mull, MD;  Location: [facility] ORS;  Service: Orthopedics;  Laterality: Left;  . INSERT / REPLACE / REMOVE PACEMAKER  2004  . JOINT REPLACEMENT Left 2004   partial knee replacement  . OLECRANON BURSECTOMY Left 03/27/2018   Procedure: LEFT OLECRANON BURSA;  Surgeon: Corky Mull, MD;  Location:  [facility] ORS;  Service: Orthopedics;  Laterality: Left;  . PACEMAKER INSERTION  2014   dual chamber for complete heart block    There were no vitals filed for this visit.  Subjective Assessment - 10/16/19 1508    Subjective  Walking is terrible, not any better. Early afternoons are better. The arthritis in her hands bother her. The L LE neuropathy bothers her. Saw her PCP (Dr. Doy Hutching) who wants pt to see her neurologist again. Has not done much in the past couple of weeks. Thinks that PT is helping. Feels encouraged with function when going to PT.  Both knees feel wobbly and feels that PT helps keep her knees for wobbling. Feels that PT helps her push herself to move both mentally and physically. No knee pain. Not really having back pain. L elbow is an issue again and has to see her doctor again. The positivity exposure at PT helps.    Pertinent History  Imparied gait. Pt states that she has had balance issues for too long. Feels more comfortable walking with her rw. Had tests which showed that she has neuropathy. However the doctor at [facility] said that her balance is coming from her back. Just wants to be strong so she can walk.  Started walking with her rw off and on  for the past several months.  Also feels like she might need a new sleep number bed. Also had 2 wound surgeries at [name]. Pt fell in February 2019. Had to stop PT due to the L elbow surgery April 06, 2018 to remove a bursa. The wound still has not healed.   Currently sees a wound doctor.   Feels numbness L foot from the ankle down.  No R foot numbness.  Sometimes unsure of where her L LE is in space.  Pt also states that she tires too quickly.  Has not been able to walk her dog in 2 years. Has not really been doing exercises at home except the sit <> stand. Pt states that prolonged sitting without back support will increase back pain     Currently in Pain?  No/denies    Pain Score  0-No pain                                PT Education - 10/16/19 1921    Education provided  Yes    Education Details  gait    Person(s) Educated  Patient    Methods  Explanation;Demonstration;Tactile cues;Verbal cues    Comprehension  Returned demonstration;Verbalized understanding          OBJECTIVE  Pt ambulating with rw   No latex band allergies    MedbridgeAccess Code: [email or web address]   Pt has pacemaker  Wound with dressing R tibialis anterior muscle.  Gait:  Gait with PT hand held assist min A 100 ft  Gait x 10 meters with SPC CGA to min A   1.29 minutes (89 seconds; 0.11 m/s), 1.12 minutes (72 seconds; 0.14 m/s);  0.13 m/s average with SPC   Fear of falling and less confidence with SPC compared to PT hand held assist observed.   Gait x 10 meters with PT hand held assist    27 seconds (0.37 m/s), 22 seconds (0.45 m/s); 0.41 m/s average with PT hand held assist    Pt demonstrates increased step length and more smooth gait with PT hand held assist   Increased confidence and decreased fear with gait with PT assist    Gait with narrow based quad cane CGA 75 ft  Better able to ambulate with [facility] secondary to AD feeling more secure.    Reviewed plan of care: continue 2x/week for 6 weeks   Gait with PT handheld assist 72 ft with CGA and no UE assist for last 3 ft.    Improved technique, movement at target joints, use of target muscles after mod verbal, visual, tactile cues.       Patient response to treatment: Pt able to ambulate 3 ft without UE assist at end of session  Clinical Impression  Pt returns to PT after a 2 week hiatus. She currently demonstrates decreased gait speed using a SPC with increased fear of falling and decreased confidence observed. Pt able to ambulate more quickly, smoothly and significant decrease in unsteadiness with PT hand held assist and improved confidence and decreased fear of  falling observed. Continued working on gait with least restrictive AD to improve pt confidence and balance. Pt will benefit from continued skilled physical therapy to regain and maintain progress previously gained and to prevent loss of function.     PT Short Term Goals - 04/25/19 1723      PT SHORT TERM GOAL #1  Title  Patient will be independent with her HEP to improve strength and balance.     Time  3    Period  Weeks    Status  On-going    Target Date  05/08/19        PT Long Term Goals - 10/16/19 1923      PT LONG TERM GOAL #1   Title  Patient will improve her 10 MWT to 0.8 m/s or more with rw to promote better community ambulation.     Baseline  0.5 seconds with rw (09/06/2018); 0.63 seconds average with rw (10/18/2018); 0.69 m/s average using rw (12/10/2018); 0.59 m/s using SPC and CGA (01/08/2019); 0.60 m/s using SPC (01/21/2019); 0.56 m/s with SPC to no AD assist, CGA (02/04/2019); 0.45 m/s average (04/17/2019); 0.53 m/s with SPC (06/06/2019); 0.26 m/s average wiht SPC (08/13/2019); unable to assess today secondary to difficulty ambulating with SPC today (09/03/2019);  0.13 m/s average with SPC and 0.41 seconds average with PT hand held assist (10/16/2019)    Time  6    Period  Weeks    Status  On-going    Target Date  11/28/19      PT LONG TERM GOAL #2   Title  Patient will improve her DGI score using rw or least restrictive AD to 19/24 or more to promote balance.     Baseline  13/24 using rw (09/06/2018); 14/24 using rw (10/29/2018); 16/24 (12/10/2018); 14/24 using SPC (01/21/2019); 9/24 using SPC (04/17/2019); 14/24 (06/11/2019; unable to complete test today secondary to pt request (08/13/2019); unable to assess today secondary to difficulty [facility]ulating [facility] (09/03/2019)    Time  6    Period  Weeks    Status  Deferred    Target Date  11/28/19      PT LONG TERM GOAL #3   Title  Patient will improve bilateral LE strength by at least 1/2 MMT to promote ability to support herself  when performing standing tasks and improve balance.     Time  6    Period  Weeks    Status  On-going    Target Date  11/28/19      PT LONG TERM GOAL #4   Title  Patient will have a decrease in back pain/ache to 5/10 or less to promote ability to perform standing tasks with less difficulty.    Baseline  8/10 at most for the past 3 months (09/06/2018); 5-6/10 at worst for the past 7 days (10/18/2018); 5/10 at worst for the past 7 days (12/10/2018)    Time  6    Period  Weeks    Status  Achieved            Plan - 10/16/19 1609    Clinical Impression Statement  Pt returns to PT after a 2 week hiatus. She currently demonstrates decreased gait speed using a SPC with increased fear of falling and decreased confidence observed. Pt able to ambulate more quickly, smoothly and significant decrease in unsteadiness with PT hand held assist and improved confidence and decreased fear of falling observed. Continued working on gait with least restrictive AD to improve pt confidence and balance. Pt will benefit from continued skilled physical therapy to regain and maintain progress previously gained and to prevent loss of function.    Personal Factors and Comorbidities  Age;Comorbidity 3+;Time since onset of injury/illness/exacerbation;Past/Current Experience    Comorbidities  Pacemaker, L elbow and R tibialis anterior wounds with difficulty healing, back surgery  Examination-Activity Limitations  Carry;Locomotion Level;Stairs    Stability/Clinical Decision Making  Evolving/Moderate complexity    Clinical Decision Making  Moderate    Clinical Presentation due to:  increased difficulty ambulating with SPC    Rehab Potential  Fair    Clinical Impairments Affecting Rehab Potential  age, fear of falling, weakness    PT Frequency  2x / week    PT Duration  6 weeks    PT Treatment/Interventions  Aquatic Therapy;Gait training;Therapeutic activities;Therapeutic exercise;Balance training;Neuromuscular  re-education;Patient/family education;Manual techniques;Dry needling    PT Next Visit Plan  trunk, hip, scapular strengthening, balance, gait, manual techniques PRN    Consulted and Agree with Plan of Care  Patient       Patient will benefit from skilled therapeutic intervention in order to improve the following deficits and impairments:  Abnormal gait, Decreased activity tolerance, Decreased balance, Decreased endurance, Decreased range of motion, Decreased strength, Difficulty walking, Improper body mechanics, Postural dysfunction, Pain, Decreased knowledge of use of DME  Visit Diagnosis: Difficulty in walking, not elsewhere classified - Plan: PT plan of care cert/re-cert  Muscle weakness (generalized) - Plan: PT plan of care cert/re-cert  History of falling - Plan: PT plan of care cert/re-cert  Unsteadiness on feet - Plan: PT plan of care cert/re-cert  Chronic low back pain, unspecified back pain laterality, unspecified whether sciatica present - Plan: PT plan of care cert/re-cert     Problem List Patient Active Problem List   Diagnosis Date Noted  . Septic olecranon bursitis of left elbow 03/27/2018  . Lymphedema 10/11/2017  . Chronic venous insufficiency 10/11/2017  . Leg pain 10/11/2017  . Leg swelling 10/11/2017  . COPD (chronic obstructive pulmonary disease) (HCC) 10/11/2017  . Essential hypertension 10/11/2017    Loralyn Freshwater PT, DPT   10/16/2019, 7:34 PM  [place] [address], [place], [address] Phone: [phone]   Fax:  [phone]  Name: [name] MRN: [id number] Date of Birth: Jul 19, 1930

## 2019-10-17 ENCOUNTER — Encounter: Payer: Medicare Other | Attending: Physician Assistant | Admitting: Physician Assistant

## 2019-10-17 DIAGNOSIS — L97812 Non-pressure chronic ulcer of other part of right lower leg with fat layer exposed: Secondary | ICD-10-CM | POA: Diagnosis not present

## 2019-10-17 NOTE — Progress Notes (Signed)
[name], [name] ([id number]) Visit Report for 10/17/2019 Arrival Information Details Patient Name: [name], [name] Date of Service: 10/17/2019 11:00 AM Medical Record Number: [id number] Patient Account Number: [id number] Date of Birth/Sex: 22-Mar-1930 (85 y.o. F) Treating RN: Rodell Perna Primary Care Hai Grabe: Aram Beecham Other Clinician: Referring Genola Yuille: Aram Beecham Treating Taylinn Brabant/Extender: Linwood Dibbles, HOYT Weeks in Treatment: 15 Visit Information History Since Last Visit Added or deleted any medications: No Patient Arrived: Walker Any new allergies or adverse reactions: No Arrival Time: 11:04 Had a fall or experienced change in No Accompanied By: self activities of daily living that may affect Transfer Assistance: None risk of falls: Patient Identification Verified: Yes Signs or symptoms of abuse/neglect since last visito No Secondary Verification Process Completed: Yes Hospitalized since last visit: No Patient Requires Transmission-Based Precautions: No Implantable device outside of the clinic excluding No Patient Has Alerts: No cellular tissue based products placed in the center since last visit: Has Dressing in Place as Prescribed: Yes Pain Present Now: No Electronic Signature(s) Signed: 10/17/2019 4:57:32 PM By: Dayton Martes RCP, RRT, CHT Entered By: Dayton Martes on 10/17/2019 11:05:51 [name] ([id number]) -------------------------------------------------------------------------------- Encounter Discharge Information Details Patient Name: [name] Date of Service: 10/17/2019 11:00 AM Medical Record Number: [id number] Patient Account Number: [id number] Date of Birth/Sex: March 11, 1930 (85 y.o. F) Treating RN: Rodell Perna Primary Care Janya Eveland: Aram Beecham Other Clinician: Referring Billie Trager: Aram Beecham Treating Nivia Gervase/Extender: Linwood Dibbles, HOYT Weeks in Treatment: 15 Encounter Discharge Information  Items Post Procedure Vitals Discharge Condition: Stable Temperature (F): 98.2 Ambulatory Status: Walker Pulse (bpm): 66 Discharge Destination: Home Respiratory Rate (breaths/min): 16 Transportation: Private Auto Blood Pressure (mmHg): 137/60 Accompanied By: self Schedule Follow-up Appointment: Yes Clinical Summary of Care: Electronic Signature(s) Signed: 10/17/2019 4:39:17 PM By: Rodell Perna Entered By: Rodell Perna on 10/17/2019 11:39:01 [name] ([id number]) -------------------------------------------------------------------------------- Multi Wound Chart Details Patient Name: [name] Date of Service: 10/17/2019 11:00 AM Medical Record Number: [id number] Patient Account Number: [id number] Date of Birth/Sex: 1930/05/19 (85 y.o. F) Treating RN: Rodell Perna Primary Care Kedrick Mcnamee: Aram Beecham Other Clinician: Referring Kaysi Ourada: Aram Beecham Treating Donnice Nielsen/Extender: Linwood Dibbles, HOYT Weeks in Treatment: 15 Vital Signs Height(in): 60 Pulse(bpm): 66 Weight(lbs): 125 Blood Pressure(mmHg): 137/60 Body Mass Index(BMI): 24 Temperature(F): 98.2 Respiratory Rate 16 (breaths/min): Photos: [N/A:N/A] Wound Location: Right Lower Leg - Anterior N/A N/A Wounding Event: Trauma N/A N/A Primary Etiology: Skin Tear N/A N/A Comorbid History: Cataracts, Glaucoma, Optic N/A N/A Neuritis, Chronic sinus problems/congestion, Middle ear problems Date Acquired: 06/27/2019 N/A N/A Weeks of Treatment: 15 N/A N/A Wound Status: Open N/A N/A Measurements L x W x D 2x2x0.1 N/A N/A (cm) Area (cm) : 3.142 N/A N/A Volume (cm) : 0.314 N/A N/A % Reduction in Area: 70.60% N/A N/A % Reduction in Volume: 94.10% N/A N/A Classification: Full Thickness Without N/A N/A Exposed Support Structures Exudate Amount: Medium N/A N/A Exudate Type: Serous N/A N/A Exudate Color: amber N/A N/A Wound Margin: Flat and Intact N/A N/A Granulation Amount: Medium (34-66%) N/A N/A Granulation  Quality: Red N/A N/A Necrotic Amount: Medium (34-66%) N/A N/A Necrotic Tissue: Eschar, Adherent Slough N/A N/A Exposed Structures: Fat Layer (Subcutaneous N/A N/A Tissue) Exposed: Yes Fascia: No EMMALYNNE, COURTNEY ([id number]) Tendon: No Muscle: No Joint: No Bone: No Epithelialization: None N/A N/A Treatment Notes Electronic Signature(s) Signed: 10/17/2019 4:39:17 PM By: Rodell Perna Entered By: Rodell Perna on 10/17/2019 11:25:44 [name] ([id number]) -------------------------------------------------------------------------------- Multi-Disciplinary Care Plan Details Patient Name: [name] Date of Service:  10/17/2019 11:00 AM Medical Record Number: [id number] Patient Account Number: [id number] Date of Birth/Sex: Jul 08, 1930 (85 y.o. F) Treating RN: Army Melia Primary Care Reynolds Kittel: Fulton Reek Other Clinician: Referring Jackson Coffield: Fulton Reek Treating Keenan Dimitrov/Extender: Melburn Hake, HOYT Weeks in Treatment: 15 Active Inactive Abuse / Safety / Falls / Self Care Management Nursing Diagnoses: Potential for falls Goals: Patient will remain injury free related to falls Date Initiated: 06/28/2019 Target Resolution Date: 09/21/2019 Goal Status: Active Interventions: Assess fall risk on admission and as needed Notes: Wound/Skin Impairment Nursing Diagnoses: Impaired tissue integrity Goals: Ulcer/skin breakdown will heal within 14 weeks Date Initiated: 06/28/2019 Target Resolution Date: 09/21/2019 Goal Status: Active Interventions: Assess patient/caregiver ability to obtain necessary supplies Assess patient/caregiver ability to perform ulcer/skin care regimen upon admission and as needed Assess ulceration(s) every visit Notes: Electronic Signature(s) Signed: 10/17/2019 4:39:17 PM By: Army Melia Entered By: Army Melia on 10/17/2019 11:25:24 [name] ([id number]) -------------------------------------------------------------------------------- Pain  Assessment Details Patient Name: [name] Date of Service: 10/17/2019 11:00 AM Medical Record Number: [id number] Patient Account Number: [id number] Date of Birth/Sex: Feb 18, 1930 (85 y.o. F) Treating RN: Army Melia Primary Care Ryman Rathgeber: Fulton Reek Other Clinician: Referring Jakwon Gayton: Fulton Reek Treating Kruze Atchley/Extender: Melburn Hake, HOYT Weeks in Treatment: 15 Active Problems Location of Pain Severity and Description of Pain Patient Has Paino No Site Locations Pain Management and Medication Current Pain Management: Electronic Signature(s) Signed: 10/17/2019 4:39:17 PM By: Army Melia Signed: 10/17/2019 4:57:32 PM By: Lorine Bears RCP, RRT, CHT Entered By: Lorine Bears on 10/17/2019 11:06:00 [name] ([id number]) -------------------------------------------------------------------------------- Patient/Caregiver Education Details Patient Name: [name] Date of Service: 10/17/2019 11:00 AM Medical Record Number: [id number] Patient Account Number: [id number] Date of Birth/Gender: 02-Jan-1930 (85 y.o. F) Treating RN: Army Melia Primary Care Physician: Fulton Reek Other Clinician: Referring Physician: Fulton Reek Treating Physician/Extender: Sharalyn Ink in Treatment: 15 Education Assessment Education Provided To: Patient Education Topics Provided Wound/Skin Impairment: Handouts: Caring for Your Ulcer Methods: Demonstration, Explain/Verbal Responses: State content correctly Electronic Signature(s) Signed: 10/17/2019 4:39:17 PM By: Army Melia Entered By: Army Melia on 10/17/2019 11:38:16 [name] ([id number]) -------------------------------------------------------------------------------- Wound Assessment Details Patient Name: [name] Date of Service: 10/17/2019 11:00 AM Medical Record Number: [id number] Patient Account Number: [id number] Date of Birth/Sex: [birth year] (85 y.o.  F) Treating RN: Cornell Barman Primary Care Judine Arciniega: Fulton Reek Other Clinician: Referring Jyllian Haynie: Fulton Reek Treating Christohper Dube/Extender: Melburn Hake, HOYT Weeks in Treatment: 15 Wound Status Wound Number: 11 Primary Skin Tear Etiology: Wound Location: Right Lower Leg - Anterior Wound Open Wounding Event: Trauma Status: Date Acquired: 06/27/2019 Comorbid Cataracts, Glaucoma, Optic Neuritis, Chronic Weeks Of Treatment: 15 History: sinus problems/congestion, Middle ear problems Clustered Wound: No Photos Wound Measurements Length: (cm) 2 Width: (cm) 2 Depth: (cm) 0.1 Area: (cm) 3.142 Volume: (cm) 0.314 % Reduction in Area: 70.6% % Reduction in Volume: 94.1% Epithelialization: None Tunneling: No Wound Description Full Thickness Without Exposed Support Classification: Structures Wound Margin: Flat and Intact Exudate Medium Amount: Exudate Type: Serous Exudate Color: amber Foul Odor After Cleansing: No Slough/Fibrino Yes Wound Bed Granulation Amount: Medium (34-66%) Exposed Structure Granulation Quality: Red Fascia Exposed: No Necrotic Amount: Medium (34-66%) Fat Layer (Subcutaneous Tissue) Exposed: Yes Necrotic Quality: Eschar, Adherent Slough Tendon Exposed: No Muscle Exposed: No Joint Exposed: No Bone Exposed: No [name], [name] ([id number]) Treatment Notes Wound #11 (Right, Anterior Lower Leg) Notes hydrafera, conform, tubi grip F Electronic Signature(s) Signed: 10/17/2019 5:19:38 PM By: Gretta Cool, BSN, RN, CWS, Kim RN, BSN Entered  By: Elliot GurneyWoody, BSN, RN, CWS, Kim on 10/17/2019 11:19:06 [name], [name] ([id number]) -------------------------------------------------------------------------------- Vitals Details Patient Name: [name], [name] Date of Service: 10/17/2019 11:00 AM Medical Record Number: [id number] Patient Account Number: [id number] Date of Birth/Sex: 04/11/1930 (85 y.o. F) Treating RN: Rodell PernaScott, Dajea Primary Care Dennise Raabe: Aram BeechamSparks, Jeffrey Other  Clinician: Referring Jourdan Durbin: Aram BeechamSparks, Jeffrey Treating Meeka Cartelli/Extender: Linwood DibblesSTONE III, HOYT Weeks in Treatment: 15 Vital Signs Time Taken: 11:07 Temperature (F): 98.2 Height (in): 60 Pulse (bpm): 66 Weight (lbs): 125 Respiratory Rate (breaths/min): 16 Body Mass Index (BMI): 24.4 Blood Pressure (mmHg): 137/60 Reference Range: 80 - 120 mg / dl Electronic Signature(s) Signed: 10/17/2019 4:57:32 PM By: Dayton MartesWallace, RCP,RRT,CHT, Sallie RCP, RRT, CHT Entered By: Weyman RodneyWallace, RCP,RRT,CHT, Lucio EdwardSallie on 10/17/2019 11:08:54

## 2019-10-17 NOTE — Progress Notes (Signed)
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 have been using Iodoflex 08/01/18; difficult wound over her left olecranon process. She started back on methotrexate last week because of rheumatologic pain in her hands. We have been using Iodoflex to try and clean out the wound bed. She has been approved for Graphix PL 08/15/18; 2 week follow-up. Difficult wound over her left olecranon process. Graphix PL #1 with collagen backing 08/22/18; one-week follow-up. Difficult wound over her left olecranon process. Graphix PL #2 08/29/18; no major improvement. Difficult wound over her left olecranon process. Still considerable undermining. Graphics PL #3 1 week follow-up. Graphix #4 09/12/18 graphics #5. Some improvement in wound area although the undermining superiorly still has not closed down as much as I would  like 09/19/18; Graphix #6 I think there is improvement in the undermining from 7 to 9:00. Wound bed looks healthy. 09/26/18 Graffix #7 undermining is 0.5 cm maximally at roughly 8:00. From 12 to 7:00 the tissue is adherent which is a major improvement there is some advancing skin from this side. 10/03/18; Graphix #8 no major changes from last week 10/10/18 Graffix #9 There are improvements. There appears to be granulation coming up to the surface here and there is a lot less undermining at 8:00. 10/17/18. Graffix #10; Dimensions are improved less undermining surface felt the but the wound is still open. Initially a surgical wound following a bursectomy 10/24/18; Graffix #11. This is really stalled over the last 2 weeks. If there is no further improvement this will be the last application.The final option for this difficult area would be plastic surgery and will set up a consult with Dr. Marina Goodell in [facility] 10/31/18; wound looks about the same. The undermining superiorly is 0.7 cm. On the lateral edges perhaps some improvement there is no drainage. 11-07-2018 patient seen today for follow-up and management of left elbow wound. She has completed a total of 11 treatments of the graffix with not much improvement. She has an upcoming appointment with plastic surgery to assist with additional treatment options for the left elbow wound on 11/19/18. There is significant amount surrounding undermining of the wound is 0.9 cm. Currently prescribed methotrexate. Wound is being treated with Indoform and border dressing. No drainage from wound. No fever, chills. or pain. 11/21/18; the patient continues to have the wound looking roughly the same with undermining from about 12 to 6:00. This has not changed all that much. She does have skin irritation around the wound that looks like drainage maceration issues. The patient states that she was not able to have her wound dressing changed because of illness in the  person he usually does this. She also did not attend her clinic appointment today with Dr. Marina Goodell because of transportation issues. She is rebooked for some time in mid January 11/28/2018; the patient has less undermining using endoform. As a understandings she saw Dr. Thad Ranger who is Dr. Lonni Fix partner. He recommended putting  her in a elbow brace and I believe is written a prescription for it. He also recommended Motrin 800 mg 3 times daily. This is prescription strength ibuprofen although he did not write his prescription. This apparently was for 2 weeks. Culture I did last time grew a few methicillin sensitive staph aureus. After some difficulty due to drug intolerances/allergies and drug interactions I settled on a 5-day course of azithromycin GERILYNN, MCCULLARS ([id number]) 12/03/18 on evaluation today patient actually appears to be doing fairly well in regard to her elbow when compared to last time I evaluated her. With that being said there does not appear to be any signs of infection at this time. That was the big concern currently as far as the patient was concerned. Nonetheless I do feel like she is making progress in regard to the feeling of this ulcer it has been slow. She did see a Engineer, petroleum they are talking about putting her in a brace in order to allow this area to heal more appropriately. 12/19/2018; not much change in this from the last time I have saw this.'s much smaller area than when she first came in and with less circumferential undermining however this is never really adhered. She is wearing the brace that was given or prescribed to her by plastics. She did not have a procedure offered to attempt to close this. We have been using endoform 1/15; wound actually is not doing as well as last week. She was actually not supposed to come into this clinic again until next week but apparently her attendant noticed some redness increasing pain and she came in early. She  reports the same amount of drainage. We have been using endoform. She is approved through puraply however I will only consider starting that next week 1/22; she completed the antibiotics last week. Culture I did was negative. In spite of this there is less erythema and pain complaints in the wound. Puraply #1 applied today 1/29; Puraply #2 today. Wound surface looks a lot better post debridement of adherent fibrinous material. However undermining from 6-12 is measuring worse 2/5; Puraply #3. Using her elbow brace 2/12 puraply #4 2/19 puraply #5. The 9:00 undermining measured at 0.5 cm. Undermining from 4-11 o'clock. Surface of the wound looks better and the circumference of the wound is smaller however the undermining is not really changed 2/26; still not much improvement. She has undermining from 4-9 o'clock 0.9 cm. Surface of the wound covered and adherent debris. 3/4; still no improvement. Undermining from 4-9 o'clock still around a centimeter. Surface of the wound looks somewhat better. No debridement is required we used endoform after we ended the trial of puraply last week 3/11; really no improvement at all. Still 1 cm undermining from roughly 9-3 o'clock. This is about a centimeter. The base of the wound looks fairly healthy. No debridement. We have been using endoform. I am really out of most usual options here. I could consider either another round of an amniotic advanced treatment product example epifix or perhaps regranex. Understandably the patient is a bit frustrated. We did send her to plastic surgery for a consult. Other than prescribing her a brace to immobilize the elbow they did not think she was a candidate for any further surgery. Notable that the patient is not using the brace today 3/18-Patient returns for attention to the left elbow area which apparently looked red at the home health visit. Patient's elbow looks the same if not better compared with last visit.  The area of  ulceration remains the same, the base appears healthy. We are continuing to use endoform she has been encouraged to use the brace to keep the elbow straight 3/25; the patient has an appointment at the [facility] wound care center on 4/2. I had actually put her out indefinitely however she seems to want to come back here every week. This week she complains of increased pain and malodor. Dimensions of the elbow wound are larger. We have been using endoform 4/8; the patient went to [facility] where they apparently gave her meta honey and some border foam with a Tubigrip. She has been using this for a week. She says she was very impressed with them there. They did not offer her any surgical consultation. She seems to be coming back here for a second opinion on this, she does not wish to drive to [facility] every week 1/61; still using Medihoney foam border and a Tubigrip. Actually do not think she has anything on the arm at all specifically she is not using her brace 5/6; she has been using medihoney without a lot of improvement. Undermining maximum at 9:00 at 0.5 cm may be somewhat better. She is still complaining of discomfort. She uses her elbow brace at night but is not using anything on the arm during the day, she finds it too restrictive 5/13; we switch the patient to endoform AG last week. She is complaining of more pain and worried about some circumferential erythema around the wound. I had planned to consider epifix in this wound however the patient came in with a request to see a plastic surgeon in [place] by the name of Dr. Wyline Mood who cared for a friend of hers. Noteworthy that I have already sent her to one plastic surgeon and she went for another second opinion at [facility] and they apparently did not send her to a plastic surgeon nevertheless the patient is fairly convinced that she might benefit from a skin graft which I am doubtful. She also has rheumatoid arthritis and is on methotrexate. This was  originally a surgical wound for a bursectomy a year or 2 ago Readmission: 06/28/19 on evaluation today patient presents today for reevaluation but this is due to a new issue her elbow has completely healed and looks excellent. Her right anterior lower leg has a skin tear which was sustained from her dog who jumped up on her and inadvertently scratch the area causing the skin tear. This happened yesterday. She is having some discomfort but fortunately nothing too significant which is good news. No fevers, chills, nausea, or vomiting noted at this time. The skin fortunately was knocked one completely off and we are gonna see about we approximate in the skin as best we can in using Steri-Strips to hold this in place obviously if we can get some of this to reattach that would be beneficial MEGHIN, THIVIERGE. ([id number]) 07/05/2019 on evaluation today patient actually appears to be doing okay although unfortunately the skin flap that we were attempting to Steri-Stripped down last week did not take. She is developing a lot of fluid underneath the wound area unfortunately which again is not ideal. I think that this necrotic tissue needs to be removed and again it actually just wiped off during the evaluation today as I was attempting to clean the wound there did not appear to be any significant issues underlying which is good although there was some purulent drainage I did want to go ahead and see about obtaining  a culture from today in order to ensure that the Z-Pak that I placed her on earlier in the week was appropriate for treating what ever infection may be causing the issue currently. This was a deep wound culture obtained today. 07/12/2019 on evaluation today patient actually appears to be doing quite well with regard to her right lower extremity ulcer all things considering. There is still little bit of hematoma not it that is noted in the central portion of the wound along with some necrotic tissue  but she is still having quite a bit of discomfort. For that reason I did not perform sharp debridement today although this wound does need some debridement of one type or another. I think we may attempt Iodoflex to see if this can be of benefit. Fortunately there is no signs of active infection at this point. 07/18/19 upon evaluation today patient appears to be doing better with regard to her ulcer on her right lower extremity. She's been tolerating the dressing changes without complication. The good news is she seems to be making excellent progress. Overall I'm pleased with there being no signs of infection. With that being said she does tell me that she's been having some discomfort with the wrap she's unsure of exactly what about the rafters causing her trouble. 07/25/2019 on evaluation today patient appears to be doing a little better in regard to her lower extremity ulcer. Unfortunately the alginate seems to be getting really stuck to the wound bed and surrounding periwound. Fortunately there is no signs of active infection at this time. There does not appear to be any evidence of infection currently. 08/01/2019 on evaluation today patient actually appears to be doing much better with regard to her right lower extremity ulcer. She has been tolerating the dressing changes without complication. Fortunately her wound does not show any signs of infection and seems to be making good progress. No fevers, chills, nausea, vomiting, or diarrhea. 08/08/2019 on evaluation today patient actually appears to be doing better with regard to her leg ulcer. She has been tolerating the dressing changes without complication. With that being said I think we may want to switch the dressing up today just based on the appearance of the wound bed in general. Fortunately there is no evidence of infection currently. Her pain seems to be much better. Her son is present with her today. 08/15/2019 on evaluation today patient  appears to be doing very well with regard to her right lower extremity ulcer. She still complains about feeling like the compression stocking/Tubigrip is too tight for her. Nonetheless I still think this is beneficial and is helping the wound to heal more effectively. Overall I am extremely pleased at this time with what I am seeing. 08/22/2019 on evaluation today patient actually appears to be doing quite well with regard to her right lower extremity. She has been tolerating the dressing changes without complication. Fortunately there is no signs of active infection at this time. She still complains about the Tubigrip but nonetheless I think this is something that is of utmost importance for her to continue to wear if she is going to see this area heal appropriately. 08/29/2019 on evaluation today patient actually appears to be doing well visually in regard to her wound. Unfortunately in regard to overall pain she seems to be having more pain today which I am somewhat concerned about. There does not appear to be any signs of active infection that I can tell but again with increased pain  that is definitely a concern here today. I do think it may be time to switch up the dressing as well to something that will be a little bit more effective hopefully and new tissue growth she has done well with collagen in the past. 09/05/2019 on evaluation today patient actually appears to be doing well with regard to her leg ulcer. She has been tolerating the dressing changes without complication. Fortunately there is no signs of active infection. We did obtain a wound culture last week due to the fact that she still is very touchy as far as the surface of the wound is concerned. Unfortunately I am not sure what happened to that culture as there is no record in the computer system. Nonetheless we need to follow-up on this and try to see if we can identify what exactly happened with this specimen as we have not gotten a  result back either obviously. 09/12/2019 on evaluation today patient actually appears to be showing signs of good improvement upon evaluation today. She states her pain is not nearly as bad as what it was which is also good news. She did have a wound culture which was reviewed today that showed no growth of bacteria which is good news. This coupled with the fact that her wound appears to be healing better, measuring smaller, and overall even the slough buildup is minimal compared to what we have seen previously, I feel like she is actually showing signs of excellent improvement today. SHANTANA, CHRISTON ([id number]) 09/19/2019 on evaluation today patient appears to be doing well with regard to her wounds on evaluation today. She is showing signs of continued improvement week by week and everything is measuring significantly smaller which is great news. Overall very pleased with the progress that she has made. 09/26/2019 on evaluation today patient appears to be doing fairly well with regard to her right lower extremity ulcer. She does continue to have some drainage and again the smaller of the 2 wounds is not making as much progress as I like to see. We have been doing the silver collagen for some time I think we may want to switch to [facility] she has a little bit of hyper granular tissue this may help to keep things from becoming too dramatic in that regard. Fortunately there is no signs of infection at this time. 10/03/2019 on evaluation today patient appears to be doing fairly well with regard to her lower extremity ulcers at this time. She has been tolerating the dressing changes without complication. Fortunately the wound is measuring smaller which is great news she continues to make great progress. 10/17/2019 on evaluation today patient appears to be doing well with regard to her wounds of her right lower extremity. She states she has been having some increased pain apparently we gave her a  little bit smaller size Tubigrip last time she was here this may have been what was causing some issues. She is out of the Tubigrip that we had ordered for her and therefore did not have any of the smaller size. Fortunately there is no evidence of active infection at this time. No fever chills noted Patient History Information obtained from Patient. Family History Heart Disease - Siblings,Father, No family history of Cancer, Diabetes, Hereditary Spherocytosis, Hypertension, Kidney Disease, Lung Disease, Seizures, Stroke, Thyroid Problems, Tuberculosis. Social History Never smoker, Alcohol Use - Daily, Drug Use - No History, Caffeine Use - Never. Medical History Eyes Patient has history of Cataracts, Glaucoma, Optic Neuritis Ear/Nose/Mouth/Throat  Patient has history of Chronic sinus problems/congestion, Middle ear problems Medical And Surgical History Notes Constitutional Symptoms (General Health) Back pain Ear/Nose/Mouth/Throat bilateral hearing aides Review of Systems (ROS) Constitutional Symptoms (General Health) Denies complaints or symptoms of Fatigue, Fever, Chills, Marked Weight Change. Respiratory Denies complaints or symptoms of Chronic or frequent coughs, Shortness of Breath. Cardiovascular Denies complaints or symptoms of Chest pain, LE edema. Psychiatric Denies complaints or symptoms of Anxiety, Claustrophobia. TANGY, DROZDOWSKI ([id number]) Objective Constitutional Well-nourished and well-hydrated in no acute distress. Vitals Time Taken: 11:07 AM, Height: 60 in, Weight: 125 lbs, BMI: 24.4, Temperature: 98.2 F, Pulse: 66 bpm, Respiratory Rate: 16 breaths/min, Blood Pressure: 137/60 mmHg. Respiratory normal breathing without difficulty. Psychiatric this patient is able to make decisions and demonstrates good insight into disease process. Alert and Oriented x 3. pleasant and cooperative. General Notes: Patient's wound bed today did require some sharp debridement  clear away necrotic tissue from the surface of the wound and the patient tolerated this without complication. Post debridement the wound bed appears to be doing much better which is great news. I do believe that she is still showing signs of improving with regard to the leg and I am pleased with how the [facility] has done I would recommend that we continue as such. Integumentary (Hair, Skin) Wound #11 status is Open. Original cause of wound was Trauma. The wound is located on the Right,Anterior Lower Leg. The wound measures 2cm length x 2cm width x 0.1cm depth; 3.142cm^2 area and 0.314cm^3 volume. There is Fat Layer (Subcutaneous Tissue) Exposed exposed. There is no tunneling noted. There is a medium amount of serous drainage noted. The wound margin is flat and intact. There is medium (34-66%) red granulation within the wound bed. There is a medium (34- 66%) amount of necrotic tissue within the wound bed including Eschar and Adherent Slough. Assessment Active Problems ICD-10 Unspecified open wound, right lower leg, initial encounter Non-pressure chronic ulcer of other part of right lower leg with fat layer exposed Struck by dog, initial encounter Procedures Wound #11 Pre-procedure diagnosis of Wound #11 is a Skin Tear located on the Right,Anterior Lower Leg . There was a Excisional Skin/Subcutaneous Tissue Debridement with a total area of 4 sq cm performed by STONE III, HOYT E., PA-C. With the following instrument(s): Curette to remove Viable and Non-Viable tissue/material. Material removed includes Subcutaneous Tissue and Slough and after achieving pain control using Lidocaine. A time out was conducted at 11:26, prior to the start of the procedure. There was no bleeding. The procedure was tolerated well. Post Debridement Measurements: 2cm length x 2cm width x 0.1cm depth; 0.314cm^3 volume. ALIVEAH, GALLANT ([id number]) Character of Wound/Ulcer Post Debridement is stable. Post procedure  Diagnosis Wound #11: Same as Pre-Procedure Plan Wound Cleansing: Wound #11 Right,Anterior Lower Leg: Clean wound with Normal Saline. Primary Wound Dressing: Wound #11 Right,Anterior Lower Leg: Hydrafera Blue Ready Transfer Secondary Dressing: Wound #11 Right,Anterior Lower Leg: Conform/Kerlix Non-adherent pad Dressing Change Frequency: Wound #11 Right,Anterior Lower Leg: Change dressing every other day. Follow-up Appointments: Wound #11 Right,Anterior Lower Leg: Return Appointment in 1 week. Edema Control: Wound #11 Right,Anterior Lower Leg: Other: - tubi grip F 1. My suggestion currently is good to be that we go ahead and continue with the [facility] dressing since that seems to be doing well for the patient she is in agreement the plan. 2. I am also going to suggest that we go ahead and continue with the Tubigrip although we will use the  larger size in order to help not bother her as much as far as the compression is concerned. 3. I do recommend she continue to elevate her legs as much as possible to help with edema control. We will see patient back for reevaluation in 1 week here in the clinic. If anything worsens or changes patient will contact our office for additional recommendations. Electronic Signature(s) Signed: 10/17/2019 12:45:14 PM By: Lenda Kelp PA-C Entered By: Lenda Kelp on 10/17/2019 12:45:14 [name] ([id number]) -------------------------------------------------------------------------------- ROS/PFSH Details Patient Name: [name] Date of Service: 10/17/2019 11:00 AM Medical Record Number: [id number] Patient Account Number: [id number] Date of Birth/Sex: 10/24/1930 (85 y.o. F) Treating RN: Rodell Perna Primary Care Provider: Aram Beecham Other Clinician: Referring Provider: Aram Beecham Treating Provider/Extender: Linwood Dibbles, HOYT Weeks in Treatment: 15 Information Obtained From Patient Constitutional Symptoms (General  Health) Complaints and Symptoms: Negative for: Fatigue; Fever; Chills; Marked Weight Change Medical History: Past Medical History Notes: Back pain Respiratory Complaints and Symptoms: Negative for: Chronic or frequent coughs; Shortness of Breath Cardiovascular Complaints and Symptoms: Negative for: Chest pain; LE edema Psychiatric Complaints and Symptoms: Negative for: Anxiety; Claustrophobia Eyes Medical History: Positive for: Cataracts; Glaucoma; Optic Neuritis Ear/Nose/Mouth/Throat Medical History: Positive for: Chronic sinus problems/congestion; Middle ear problems Past Medical History Notes: bilateral hearing aides HBO Extended History Items Ear/Nose/Mouth/Throat: Eyes: Eyes: Ear/Nose/Mouth/Throat: Chronic sinus Cataracts Glaucoma Middle ear problems problems/congestion Immunizations Pneumococcal Vaccine: Received Pneumococcal Vaccination: No Implantable Devices GLORIANA, PILTZ ([id number]) Yes Family and Social History Cancer: No; Diabetes: No; Heart Disease: Yes - Siblings,Father; Hereditary Spherocytosis: No; Hypertension: No; Kidney Disease: No; Lung Disease: No; Seizures: No; Stroke: No; Thyroid Problems: No; Tuberculosis: No; Never smoker; Alcohol Use: Daily; Drug Use: No History; Caffeine Use: Never; Financial Concerns: No; Food, Clothing or Shelter Needs: No; Support System Lacking: No; Transportation Concerns: No Physician Affirmation I have reviewed and agree with the above information. Electronic Signature(s) Signed: 10/17/2019 4:39:17 PM By: Rodell Perna Signed: 10/17/2019 6:06:55 PM By: Lenda Kelp PA-C Entered By: Lenda Kelp on 10/17/2019 12:44:28 [name] ([id number]) -------------------------------------------------------------------------------- SuperBill Details Patient Name: [name] Date of Service: 10/17/2019 Medical Record Number: [id number] Patient Account Number: [id number] Date of Birth/Sex: [birth year] (85 y.o.  F) Treating RN: Rodell Perna Primary Care Provider: Aram Beecham Other Clinician: Referring Provider: Aram Beecham Treating Provider/Extender: Linwood Dibbles, HOYT Weeks in Treatment: 15 Diagnosis Coding ICD-10 Codes Code Description S81.801A Unspecified open wound, right lower leg, initial encounter L97.812 Non-pressure chronic ulcer of other part of right lower leg with fat layer exposed W54.1XXA Struck by dog, initial encounter Facility Procedures CPT4 Code Description: [id number] [phone] - DEB SUBQ TISSUE 20 SQ CM/< ICD-10 Diagnosis Description L97.812 Non-pressure chronic ulcer of other part of right lower leg wit Modifier: h fat layer expo Quantity: 1 sed Physician Procedures CPT4 Code Description: [id number] - WC PHYS SUBQ TISS 20 SQ CM ICD-10 Diagnosis Description L97.812 Non-pressure chronic ulcer of other part of right lower leg wit Modifier: h fat layer expo Quantity: 1 sed Electronic Signature(s) Signed: 10/17/2019 12:46:52 PM By: Lenda Kelp PA-C Entered By: Lenda Kelp on 10/17/2019 12:46:52

## 2019-10-21 ENCOUNTER — Ambulatory Visit: Payer: Medicare Other

## 2019-10-23 ENCOUNTER — Ambulatory Visit: Payer: Medicare Other

## 2019-10-24 ENCOUNTER — Other Ambulatory Visit: Payer: Self-pay

## 2019-10-24 ENCOUNTER — Encounter: Payer: Medicare Other | Admitting: Physician Assistant

## 2019-10-24 DIAGNOSIS — L97812 Non-pressure chronic ulcer of other part of right lower leg with fat layer exposed: Secondary | ICD-10-CM | POA: Diagnosis not present

## 2019-10-24 NOTE — Progress Notes (Addendum)
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 there is a lot less undermining at 8:00. 10/17/18. Graffix #10; Dimensions are improved less undermining surface felt the but the wound is still open. Initially a surgical wound following a bursectomy 10/24/18; Graffix #11. This is really stalled over the last 2 weeks. If there is no further improvement this will be the last application.The final option for this  difficult area would be plastic surgery and will set up a consult with Dr. Marina Goodell in [facility] 10/31/18; wound looks about the same. The undermining superiorly is 0.7 cm. On the lateral edges perhaps some improvement there is no drainage. 11-07-2018 patient seen today for follow-up and management of left elbow wound. She has completed a total of 11 treatments of the graffix with not much improvement. She has an upcoming appointment with plastic surgery to assist with additional treatment options for the left elbow wound on 11/19/18. There is significant amount surrounding undermining of the wound is 0.9 cm. Currently prescribed methotrexate. Wound is being treated with Indoform and border dressing. No drainage from wound. No fever, chills. or pain. 11/21/18; the patient continues to have the wound looking roughly the same with undermining from about 12 to 6:00. This has not changed all that much. She does have skin irritation around the wound that looks like drainage maceration issues. The patient states that she was not able to have her wound dressing changed because of illness in the person he usually does this. She also did not attend her clinic appointment today with Dr. Marina Goodell because of transportation issues. She is rebooked for some time in mid January 11/28/2018; the patient has less undermining using endoform. As a understandings she saw Dr. Thad Ranger who is Dr. Lonni Fix partner. He recommended putting her in a elbow brace and I believe is written a prescription for it. He also recommended Motrin 800 mg 3 times daily. This is prescription strength ibuprofen although he did not write his prescription. This apparently was for 2 weeks. Culture I did last time grew a few methicillin sensitive staph aureus. After some difficulty due to drug intolerances/allergies and drug interactions I settled on a 5-day course of azithromycin [name], [name] ([id number]) 12/03/18 on evaluation today  patient actually appears to be doing fairly well in regard to her  elbow when compared to last time I evaluated her. With that being said there does not appear to be any signs of infection at this time. That was the big concern currently as far as the patient was concerned. Nonetheless I do feel like she is making progress in regard to the feeling of this ulcer it has been slow. She did see a Engineer, petroleum they are talking about putting her in a brace in order to allow this area to heal more appropriately. 12/19/2018; not much change in this from the last time I have saw this.'s much smaller area than when she first came in and with less circumferential undermining however this is never really adhered. She is wearing the brace that was given or prescribed to her by plastics. She did not have a procedure offered to attempt to close this. We have been using endoform 1/15; wound actually is not doing as well as last week. She was actually not supposed to come into this clinic again until next week but apparently her attendant noticed some redness increasing pain and she came in early. She reports the same amount of drainage. We have been using endoform. She is approved through puraply however I will only consider starting that next week 1/22; she completed the antibiotics last week. Culture I did was negative. In spite of this there is less erythema and pain complaints in the wound. Puraply #1 applied today 1/29; Puraply #2 today. Wound surface looks a lot better post debridement of adherent fibrinous material. However undermining from 6-12 is measuring worse 2/5; Puraply #3. Using her elbow brace 2/12 puraply #4 2/19 puraply #5. The 9:00 undermining measured at 0.5 cm. Undermining from 4-11 o'clock. Surface of the wound looks better and the circumference of the wound is smaller however the undermining is not really changed 2/26; still not much improvement. She has undermining from 4-9 o'clock 0.9  cm. Surface of the wound covered and adherent debris. 3/4; still no improvement. Undermining from 4-9 o'clock still around a centimeter. Surface of the wound looks somewhat better. No debridement is required we used endoform after we ended the trial of puraply last week 3/11; really no improvement at all. Still 1 cm undermining from roughly 9-3 o'clock. This is about a centimeter. The base of the wound looks fairly healthy. No debridement. We have been using endoform. I am really out of most usual options here. I could consider either another round of an amniotic advanced treatment product example epifix or perhaps regranex. Understandably the patient is a bit frustrated. We did send her to plastic surgery for a consult. Other than prescribing her a brace to immobilize the elbow they did not think she was a candidate for any further surgery. Notable that the patient is not using the brace today 3/18-Patient returns for attention to the left elbow area which apparently looked red at the home health visit. Patient's elbow looks the same if not better compared with last visit. The area of ulceration remains the same, the base appears healthy. We are continuing to use endoform she has been encouraged to use the brace to keep the elbow straight 3/25; the patient has an appointment at the [facility] wound care center on 4/2. I had actually put her out indefinitely however she seems to want to come back here every week. This week she complains of increased pain and malodor. Dimensions of the elbow wound are larger. We have been using endoform 4/8; the patient went to [facility] where they  apparently gave her meta honey and some border foam with a Tubigrip. She has been using this for a week. She says she was very impressed with them there. They did not offer her any surgical consultation. She seems to be coming back here for a second opinion on this, she does not wish to drive to [facility] every week 1/61; still using  Medihoney foam border and a Tubigrip. Actually do not think she has anything on the arm at all specifically she is not using her brace 5/6; she has been using medihoney without a lot of improvement. Undermining maximum at 9:00 at 0.5 cm may be somewhat better. She is still complaining of discomfort. She uses her elbow brace at night but is not using anything on the arm during the day, she finds it too restrictive 5/13; we switch the patient to endoform AG last week. She is complaining of more pain and worried about some circumferential erythema around the wound. I had planned to consider epifix in this wound however the patient came in with a request to see a plastic surgeon in [place] by the name of Dr. Wyline Mood who cared for a friend of hers. Noteworthy that I have already sent her to one plastic surgeon and she went for another second opinion at [facility] and they apparently did not send her to a plastic surgeon nevertheless the patient is fairly convinced that she might benefit from a skin graft which I am doubtful. She also has rheumatoid arthritis and is on methotrexate. This was originally a surgical wound for a bursectomy a year or 2 ago Readmission: 06/28/19 on evaluation today patient presents today for reevaluation but this is due to a new issue her elbow has completely healed and looks excellent. Her right anterior lower leg has a skin tear which was sustained from her dog who jumped up on her and inadvertently scratch the area causing the skin tear. This happened yesterday. She is having some discomfort but fortunately nothing too significant which is good news. No fevers, chills, nausea, or vomiting noted at this time. The skin fortunately was knocked one completely off and we are gonna see about we approximate in the skin as best we can in using Steri-Strips to hold this in place obviously if we can get some of this to reattach that would be beneficial [name], [name].  ([id number]) 07/05/2019 on evaluation today patient actually appears to be doing okay although unfortunately the skin flap that we were attempting to Steri-Stripped down last week did not take. She is developing a lot of fluid underneath the wound area unfortunately which again is not ideal. I think that this necrotic tissue needs to be removed and again it actually just wiped off during the evaluation today as I was attempting to clean the wound there did not appear to be any significant issues underlying which is good although there was some purulent drainage I did want to go ahead and see about obtaining a culture from today in order to ensure that the Z-Pak that I placed her on earlier in the week was appropriate for treating what ever infection may be causing the issue currently. This was a deep wound culture obtained today. 07/12/2019 on evaluation today patient actually appears to be doing quite well with regard to her right lower extremity ulcer all things considering. There is still little bit of hematoma not it that is noted in the central portion of the wound along with some necrotic tissue but she is  still having quite a bit of discomfort. For that reason I did not perform sharp debridement today although this wound does need some debridement of one type or another. I think we may attempt Iodoflex to see if this can be of benefit. Fortunately there is no signs of active infection at this point. 07/18/19 upon evaluation today patient appears to be doing better with regard to her ulcer on her right lower extremity. She's been tolerating the dressing changes without complication. The good news is she seems to be making excellent progress. Overall I'm pleased with there being no signs of infection. With that being said she does tell me that she's been having some discomfort with the wrap she's unsure of exactly what about the rafters causing her trouble. 07/25/2019 on evaluation today patient  appears to be doing a little better in regard to her lower extremity ulcer. Unfortunately the alginate seems to be getting really stuck to the wound bed and surrounding periwound. Fortunately there is no signs of active infection at this time. There does not appear to be any evidence of infection currently. 08/01/2019 on evaluation today patient actually appears to be doing much better with regard to her right lower extremity ulcer. She has been tolerating the dressing changes without complication. Fortunately her wound does not show any signs of infection and seems to be making good progress. No fevers, chills, nausea, vomiting, or diarrhea. 08/08/2019 on evaluation today patient actually appears to be doing better with regard to her leg ulcer. She has been tolerating the dressing changes without complication. With that being said I think we may want to switch the dressing up today just based on the appearance of the wound bed in general. Fortunately there is no evidence of infection currently. Her pain seems to be much better. Her son is present with her today. 08/15/2019 on evaluation today patient appears to be doing very well with regard to her right lower extremity ulcer. She still complains about feeling like the compression stocking/Tubigrip is too tight for her. Nonetheless I still think this is beneficial and is helping the wound to heal more effectively. Overall I am extremely pleased at this time with what I am seeing. 08/22/2019 on evaluation today patient actually appears to be doing quite well with regard to her right lower extremity. She has been tolerating the dressing changes without complication. Fortunately there is no signs of active infection at this time. She still complains about the Tubigrip but nonetheless I think this is something that is of utmost importance for her to continue to wear if she is going to see this area heal appropriately. 08/29/2019 on evaluation today patient  actually appears to be doing well visually in regard to her wound. Unfortunately in regard to overall pain she seems to be having more pain today which I am somewhat concerned about. There does not appear to be any signs of active infection that I can tell but again with increased pain that is definitely a concern here today. I do think it may be time to switch up the dressing as well to something that will be a little bit more effective hopefully and new tissue growth she has done well with collagen in the past. 09/05/2019 on evaluation today patient actually appears to be doing well with regard to her leg ulcer. She has been tolerating the dressing changes without complication. Fortunately there is no signs of active infection. We did obtain a wound culture last week due to the fact  that she still is very touchy as far as the surface of the wound is concerned. Unfortunately I am not sure what happened to that culture as there is no record in the computer system. Nonetheless we need to follow-up on this and try to see if we can identify what exactly happened with this specimen as we have not gotten a result back either obviously. 09/12/2019 on evaluation today patient actually appears to be showing signs of good improvement upon evaluation today. She states her pain is not nearly as bad as what it was which is also good news. She did have a wound culture which was reviewed today that showed no growth of bacteria which is good news. This coupled with the fact that her wound appears to be healing better, measuring smaller, and overall even the slough buildup is minimal compared to what we have seen previously, I feel like she is actually showing signs of excellent improvement today. [name], [name] ([id number]) 09/19/2019 on evaluation today patient appears to be doing well with regard to her wounds on evaluation today. She is showing signs of continued improvement week by week and everything is  measuring significantly smaller which is great news. Overall very pleased with the progress that she has made. 09/26/2019 on evaluation today patient appears to be doing fairly well with regard to her right lower extremity ulcer. She does continue to have some drainage and again the smaller of the 2 wounds is not making as much progress as I like to see. We have been doing the silver collagen for some time I think we may want to switch to [facility] she has a little bit of hyper granular tissue this may help to keep things from becoming too dramatic in that regard. Fortunately there is no signs of infection at this time. 10/03/2019 on evaluation today patient appears to be doing fairly well with regard to her lower extremity ulcers at this time. She has been tolerating the dressing changes without complication. Fortunately the wound is measuring smaller which is great news she continues to make great progress. 10/17/2019 on evaluation today patient appears to be doing well with regard to her wounds of her right lower extremity. She states she has been having some increased pain apparently we gave her a little bit smaller size Tubigrip last time she was here this may have been what was causing some issues. She is out of the Tubigrip that we had ordered for her and therefore did not have any of the smaller size. Fortunately there is no evidence of active infection at this time. No fever chills noted 10/24/2019 upon evaluation today patient's wounds actually appear to be showing signs of good improvement which is excellent news. She has been tolerating the dressing changes without complication and overall I am rather pleased at this point with how things seem to be progressing. The patient likewise is very happy. She still was not happy with having to use the Tubigrip she states that bunches up in her shoe. Patient History Information obtained from Patient. Family History Heart Disease -  Siblings,Father, No family history of Cancer, Diabetes, Hereditary Spherocytosis, Hypertension, Kidney Disease, Lung Disease, Seizures, Stroke, Thyroid Problems, Tuberculosis. Social History Never smoker, Alcohol Use - Daily, Drug Use - No History, Caffeine Use - Never. Medical History Eyes Patient has history of Cataracts, Glaucoma, Optic Neuritis Ear/Nose/Mouth/Throat Patient has history of Chronic sinus problems/congestion, Middle ear problems Medical And Surgical History Notes Constitutional Symptoms (General Health) Back pain Ear/Nose/Mouth/Throat  bilateral hearing aides Review of Systems (ROS) Constitutional Symptoms (General Health) Denies complaints or symptoms of Fatigue, Fever, Chills, Marked Weight Change. Respiratory Denies complaints or symptoms of Chronic or frequent coughs, Shortness of Breath. Cardiovascular Denies complaints or symptoms of Chest pain, LE edema. Psychiatric Denies complaints or symptoms of Anxiety, Claustrophobia. [name], [name] ([id number]) Objective Constitutional Well-nourished and well-hydrated in no acute distress. Vitals Time Taken: 1:52 PM, Height: 60 in, Weight: 125 lbs, BMI: 24.4, Temperature: 98.5 F, Pulse: 72 bpm, Respiratory Rate: 16 breaths/min, Blood Pressure: 132/52 mmHg. Respiratory normal breathing without difficulty. clear to auscultation bilaterally. Cardiovascular regular rate and rhythm with normal S1, S2. Psychiatric this patient is able to make decisions and demonstrates good insight into disease process. Alert and Oriented x 3. pleasant and cooperative. General Notes: Patient's wound bed currently did not require any sharp debridement she actually seems to be doing quite well with no significant slough buildup I think she is doing excellent with the Hydrofera Blue at this point. Overall I am extremely pleased with the progress that is been made. Especially since [facility]. The smaller wound which  is just lateral to the main wound actually appears to be almost completely closed. Integumentary (Hair, Skin) Wound #11 status is Open. Original cause of wound was Trauma. The wound is located on the Right,Anterior Lower Leg. The wound measures 2cm length x 2cm width x 0.1cm depth; 3.142cm^2 area and 0.314cm^3 volume. There is Fat Layer (Subcutaneous Tissue) Exposed exposed. There is no tunneling or undermining noted. There is a medium amount of serous drainage noted. The wound margin is flat and intact. There is medium (34-66%) red granulation within the wound bed. There is a medium (34-66%) amount of necrotic tissue within the wound bed including Eschar and Adherent Slough. Assessment Active Problems ICD-10 Unspecified open wound, right lower leg, initial encounter Non-pressure chronic ulcer of other part of right lower leg with fat layer exposed Struck by dog, initial encounter Plan ZincHENRY, Lawernce KeasJANICE V. ([id number]) Wound Cleansing: Wound #11 Right,Anterior Lower Leg: Clean wound with Normal Saline. Primary Wound Dressing: Wound #11 Right,Anterior Lower Leg: Hydrafera Blue Ready Transfer Secondary Dressing: Wound #11 Right,Anterior Lower Leg: Conform/Kerlix Non-adherent pad Dressing Change Frequency: Wound #11 Right,Anterior Lower Leg: Change dressing every other day. Follow-up Appointments: Wound #11 Right,Anterior Lower Leg: Return Appointment in 1 week. Edema Control: Wound #11 Right,Anterior Lower Leg: Other: - tubi grip F 1. I would recommend currently that we [facility] the [facility] dressing as that seems to have done extremely well for the patient. 2. I am also going to suggest that we continue with the Tubigrip although I did consent to trialing this week only using Tubigrip from her ankle to just below her knee and not over her foot. She will use a normal sock on her foot and we will see how things do since she was having such trouble with a bunching up in her  shoe. 3. I still recommend that she elevate her legs as much as possible when she is seated in order to help with edema control. We will see patient back for reevaluation in 1 week here in the clinic. If anything worsens or changes patient will contact our office for additional recommendations. Electronic Signature(s) Signed: 10/24/2019 3:11:28 PM By: Lenda KelpStone III, Hoyt PA-C Entered By: Lenda KelpStone III, Hoyt on 10/24/2019 15:11:28 [name], [name] ([id number]) -------------------------------------------------------------------------------- ROS/PFSH Details Patient Name: [name], Tyiesha V. Date of Service: 10/24/2019 2:00 PM Medical Record Number: [id number] Patient Account  Number: [id number] Date of Birth/Sex: 01/12/1930 (85 y.o. F) Treating RN: Rodell PernaScott, Dajea Primary Care Provider: Aram BeechamSparks, Jeffrey Other Clinician: Referring Provider: Aram BeechamSparks, Jeffrey Treating Provider/Extender: Linwood DibblesSTONE III, HOYT Weeks in Treatment: 16 Information Obtained From Patient Constitutional Symptoms (General Health) Complaints and Symptoms: Negative for: Fatigue; Fever; Chills; Marked Weight Change Medical History: Past Medical History Notes: Back pain Respiratory Complaints and Symptoms: Negative for: Chronic or frequent coughs; Shortness of Breath Cardiovascular Complaints and Symptoms: Negative for: Chest pain; LE edema Psychiatric Complaints and Symptoms: Negative for: Anxiety; Claustrophobia Eyes Medical History: Positive for: Cataracts; Glaucoma; Optic Neuritis Ear/Nose/Mouth/Throat Medical History: Positive for: Chronic sinus problems/congestion; Middle ear problems Past Medical History Notes: bilateral hearing aides HBO Extended History Items Ear/Nose/Mouth/Throat: Eyes: Eyes: Ear/Nose/Mouth/Throat: Chronic sinus Cataracts Glaucoma Middle ear problems problems/congestion Immunizations Pneumococcal Vaccine: Received Pneumococcal Vaccination: No Implantable Devices [name], Shavone V.  ([id number]) Yes Family and Social History Cancer: No; Diabetes: No; Heart Disease: Yes - Siblings,Father; Hereditary Spherocytosis: No; Hypertension: No; Kidney Disease: No; Lung Disease: No; Seizures: No; Stroke: No; Thyroid Problems: No; Tuberculosis: No; Never smoker; Alcohol Use: Daily; Drug Use: No History; Caffeine Use: Never; Financial Concerns: No; Food, Clothing or Shelter Needs: No; Support System Lacking: No; Transportation Concerns: No Physician Affirmation I have reviewed and agree with the above information. Electronic Signature(s) Signed: 10/24/2019 3:26:34 PM By: Rodell PernaScott, Dajea Signed: 10/24/2019 3:41:54 PM By: Lenda KelpStone III, Hoyt PA-C Entered By: Lenda KelpStone III, Hoyt on 10/24/2019 15:10:30 [name], Charina V. ([id number]) -------------------------------------------------------------------------------- SuperBill Details Patient Name: [name], Kalene V. Date of Service: 10/24/2019 Medical Record Number: [id number] Patient Account Number: [id number] Date of Birth/Sex: 02/12/1930 (85 y.o. F) Treating RN: Rodell PernaScott, Dajea Primary Care Provider: Aram BeechamSparks, Jeffrey Other Clinician: Referring Provider: Aram BeechamSparks, Jeffrey Treating Provider/Extender: Linwood DibblesSTONE III, HOYT Weeks in Treatment: 16 Diagnosis Coding ICD-10 Codes Code Description S81.801A Unspecified open wound, right lower leg, initial encounter L97.812 Non-pressure chronic ulcer of other part of right lower leg with fat layer exposed W54.1XXA Struck by dog, initial encounter Facility Procedures CPT4 Code: [id number] Description: [phone] - WOUND CARE VISIT-LEV 3 EST PT Modifier: Quantity: 1 Physician Procedures CPT4 Code Description: [id number] - WC PHYS LEVEL 4 - EST PT ICD-10 Diagnosis Description S81.801A Unspecified open wound, right lower leg, initial encounter L97.812 Non-pressure chronic ulcer of other part of right lower leg wi W54.1XXA Struck by  dog, initial encounter Modifier: th fat layer expo Quantity: 1 sed Electronic  Signature(s) Signed: 10/24/2019 3:11:39 PM By: Lenda KelpStone III, Hoyt PA-C Entered By: Lenda KelpStone III, Hoyt on 10/24/2019 15:11:39

## 2019-10-25 NOTE — Progress Notes (Signed)
[name], Rosella V. ([id number]) Visit Report for 10/24/2019 Arrival Information Details Patient Name: [name], Nuria V. Date of Service: 10/24/2019 2:00 PM Medical Record Number: [id number] Patient Account Number: [id number] Date of Birth/Sex: 07/04/1930 (85 y.o. F) Treating RN: Rodell PernaScott, Dajea Primary Care Isador Castille: Aram BeechamSparks, Jeffrey Other Clinician: Referring Dedria Endres: Aram BeechamSparks, Jeffrey Treating Monica Codd/Extender: Linwood DibblesSTONE III, HOYT Weeks in Treatment: 16 Visit Information History Since Last Visit Added or deleted any medications: No Patient Arrived: Walker Any new allergies or adverse reactions: No Arrival Time: 13:53 Had a fall or experienced change in No Accompanied By: Silvio PateShelia activities of daily living that may affect Transfer Assistance: None risk of falls: Patient Identification Verified: Yes Signs or symptoms of abuse/neglect since last visito No Secondary Verification Process Completed: Yes Hospitalized since last visit: No Patient Requires Transmission-Based Precautions: No Implantable device outside of the clinic excluding No Patient Has Alerts: No cellular tissue based products placed in the center since last visit: Has Dressing in Place as Prescribed: Yes Pain Present Now: No Electronic Signature(s) Signed: 10/24/2019 2:48:23 PM By: Dayton MartesWallace, RCP,RRT,CHT, Sallie RCP, RRT, CHT Entered By: Dayton MartesWallace, RCP,RRT,CHT, Sallie on 10/24/2019 13:54:31 [name], Chayla V. ([id number]) -------------------------------------------------------------------------------- Clinic Level of Care Assessment Details Patient Name: [name], Almarie V. Date of Service: 10/24/2019 2:00 PM Medical Record Number: [id number] Patient Account Number: [id number] Date of Birth/Sex: 12/15/1929 (85 y.o. F) Treating RN: Rodell PernaScott, Dajea Primary Care Gaspare Netzel: Aram BeechamSparks, Jeffrey Other Clinician: Referring Dejay Kronk: Aram BeechamSparks, Jeffrey Treating Shawni Volkov/Extender: Linwood DibblesSTONE III, HOYT Weeks in Treatment: 16 Clinic Level of Care Assessment  Items TOOL 4 Quantity Score []  - Use when only an EandM is performed on FOLLOW-UP visit 0 ASSESSMENTS - Nursing Assessment / Reassessment X - Reassessment of Co-morbidities (includes updates in patient status) 1 10 X- 1 5 Reassessment of Adherence to Treatment Plan ASSESSMENTS - Wound and Skin Assessment / Reassessment X - Simple Wound Assessment / Reassessment - one wound 1 5 []  - 0 Complex Wound Assessment / Reassessment - multiple wounds []  - 0 Dermatologic / Skin Assessment (not related to wound area) ASSESSMENTS - Focused Assessment []  - Circumferential Edema Measurements - multi extremities 0 []  - 0 Nutritional Assessment / Counseling / Intervention X- 1 5 Lower Extremity Assessment (monofilament, tuning fork, pulses) []  - 0 Peripheral Arterial Disease Assessment (using hand held doppler) ASSESSMENTS - Ostomy and/or Continence Assessment and Care []  - Incontinence Assessment and Management 0 []  - 0 Ostomy Care Assessment and Management (repouching, etc.) PROCESS - Coordination of Care []  - Simple Patient / Family Education for ongoing care 0 []  - 0 Complex (extensive) Patient / Family Education for ongoing care X- 1 10 Staff obtains ChiropractorConsents, Records, Test Results / Process Orders []  - 0 Staff telephones HHA, Nursing Homes / Clarify orders / etc []  - 0 Routine Transfer to another Facility (non-emergent condition) []  - 0 Routine Hospital Admission (non-emergent condition) []  - 0 New Admissions / Manufacturing engineernsurance Authorizations / Ordering NPWT, Apligraf, etc. []  - 0 Emergency Hospital Admission (emergent condition) X- 1 10 Simple Discharge Coordination [name], Tinzley V. ([id number]) []  - 0 Complex (extensive) Discharge Coordination PROCESS - Special Needs []  - Pediatric / Minor Patient Management 0 []  - 0 Isolation Patient Management []  - 0 Hearing / Language / Visual special needs []  - 0 Assessment of Community assistance (transportation, D/C planning, etc.) []  -  0 Additional assistance / Altered mentation []  - 0 Support Surface(s) Assessment (bed, cushion, seat, etc.) INTERVENTIONS - Wound Cleansing / Measurement X - Simple Wound Cleansing - one wound 1 5 []  -  0 Complex Wound Cleansing - multiple wounds X- 1 5 Wound Imaging (photographs - any number of wounds) []  - 0 Wound Tracing (instead of photographs) X- 1 5 Simple Wound Measurement - one wound []  - 0 Complex Wound Measurement - multiple wounds INTERVENTIONS - Wound Dressings []  - Small Wound Dressing one or multiple wounds 0 X- 1 15 Medium Wound Dressing one or multiple wounds []  - 0 Large Wound Dressing one or multiple wounds []  - 0 Application of Medications - topical []  - 0 Application of Medications - injection INTERVENTIONS - Miscellaneous []  - External ear exam 0 []  - 0 Specimen Collection (cultures, biopsies, blood, body fluids, etc.) []  - 0 Specimen(s) / Culture(s) sent or taken to Lab for analysis []  - 0 Patient Transfer (multiple staff / Civil Service fast streamer / Similar devices) []  - 0 Simple Staple / Suture removal (25 or less) []  - 0 Complex Staple / Suture removal (26 or more) []  - 0 Hypo / Hyperglycemic Management (close monitor of Blood Glucose) []  - 0 Ankle / Brachial Index (ABI) - do not check if billed separately X- 1 5 Vital Signs LAYNA, ROEPER ([id number]) Has the patient been seen at the hospital within the last three years: Yes Total Score: 80 Level Of Care: New/Established - Level 3 Electronic Signature(s) Signed: 10/24/2019 3:26:34 PM By: Army Melia Entered By: Army Melia on 10/24/2019 14:32:40 [name] ([id number]) -------------------------------------------------------------------------------- Encounter Discharge Information Details Patient Name: [name] Date of Service: 10/24/2019 2:00 PM Medical Record Number: [id number] Patient Account Number: [id number] Date of Birth/Sex: 23-Sep-1930 (85 y.o. F) Treating RN: Army Melia Primary Care Javontae Marlette: Fulton Reek Other Clinician: Referring Mashawn Brazil: Fulton Reek Treating Zekiel Torian/Extender: Melburn Hake, HOYT Weeks in Treatment: 16 Encounter Discharge Information Items Discharge Condition: Stable Ambulatory Status: Walker Discharge Destination: Home Transportation: Other Accompanied By: self Schedule Follow-up Appointment: Yes Clinical Summary of Care: Electronic Signature(s) Signed: 10/24/2019 3:26:34 PM By: Army Melia Entered By: Army Melia on 10/24/2019 14:33:38 [name] ([id number]) -------------------------------------------------------------------------------- Lower Extremity Assessment Details Patient Name: [name] Date of Service: 10/24/2019 2:00 PM Medical Record Number: [id number] Patient Account Number: [id number] Date of Birth/Sex: 06-24-1930 (85 y.o. F) Treating RN: Cornell Barman Primary Care Jaquanna Ballentine: Fulton Reek Other Clinician: Referring Yaminah Clayborn: Fulton Reek Treating Benay Pomeroy/Extender: Melburn Hake, HOYT Weeks in Treatment: 16 Edema Assessment Assessed: [Left: No] [Right: No] [Left: Edema] [Right: :] Calf Left: Right: Point of Measurement: 30 cm From Medial Instep cm 33.5 cm Ankle Left: Right: Point of Measurement: 10 cm From Medial Instep cm 21.6 cm Vascular Assessment Pulses: Dorsalis Pedis Palpable: [Right:Yes] Electronic Signature(s) Signed: 10/25/2019 3:15:39 PM By: Gretta Cool, BSN, RN, CWS, Kim RN, BSN Entered By: Gretta Cool, BSN, RN, CWS, Kim on 10/24/2019 14:05:46 [name] ([id number]) -------------------------------------------------------------------------------- Multi Wound Chart Details Patient Name: [name] Date of Service: 10/24/2019 2:00 PM Medical Record Number: [id number] Patient Account Number: [id number] Date of Birth/Sex: May 09, 1930 (85 y.o. F) Treating RN: Army Melia Primary Care Alanah Sakuma: Fulton Reek Other Clinician: Referring Tanush Drees: Fulton Reek Treating  Audreana Hancox/Extender: Melburn Hake, HOYT Weeks in Treatment: 16 Vital Signs Height(in): 60 Pulse(bpm): 72 Weight(lbs): 125 Blood Pressure(mmHg): 132/52 Body Mass Index(BMI): 24 Temperature(F): 98.5 Respiratory Rate 16 (breaths/min): Photos: [N/A:N/A] Wound Location: Right Lower Leg - Anterior N/A N/A Wounding Event: Trauma N/A N/A Primary Etiology: Skin Tear N/A N/A Comorbid History: Cataracts, Glaucoma, Optic N/A N/A Neuritis, Chronic sinus problems/congestion, Middle ear problems Date Acquired: 06/27/2019 N/A N/A Weeks of Treatment: 16 N/A N/A Wound Status: Open  N/A N/A Measurements L x W x D 2x2x0.1 N/A N/A (cm) Area (cm) : 3.142 N/A N/A Volume (cm) : 0.314 N/A N/A % Reduction in Area: 70.60% N/A N/A % Reduction in Volume: 94.10% N/A N/A Classification: Full Thickness Without N/A N/A Exposed Support Structures Exudate Amount: Medium N/A N/A Exudate Type: Serous N/A N/A Exudate Color: amber N/A N/A Wound Margin: Flat and Intact N/A N/A Granulation Amount: Medium (34-66%) N/A N/A Granulation Quality: Red N/A N/A Necrotic Amount: Medium (34-66%) N/A N/A Necrotic Tissue: Eschar, Adherent Slough N/A N/A Exposed Structures: Fat Layer (Subcutaneous N/A N/A Tissue) Exposed: Yes Fascia: No [name], Promyse V. ([id number]) Tendon: No Muscle: No Joint: No Bone: No Epithelialization: None N/A N/A Treatment Notes Electronic Signature(s) Signed: 10/24/2019 3:26:34 PM By: Rodell PernaScott, Dajea Entered By: Rodell PernaScott, Dajea on 10/24/2019 14:24:26 [name], Iretha V. ([id number]) -------------------------------------------------------------------------------- Multi-Disciplinary Care Plan Details Patient Name: [name], Toccara V. Date of Service: 10/24/2019 2:00 PM Medical Record Number: [id number] Patient Account Number: [id number] Date of Birth/Sex: 09/17/1930 (85 y.o. F) Treating RN: Rodell PernaScott, Dajea Primary Care Akai Dollard: Aram BeechamSparks, Jeffrey Other Clinician: Referring Corbyn Wildey: Aram BeechamSparks, Jeffrey Treating  Illyana Schorsch/Extender: Linwood DibblesSTONE III, HOYT Weeks in Treatment: 16 Active Inactive Abuse / Safety / Falls / Self Care Management Nursing Diagnoses: Potential for falls Goals: Patient will remain injury free related to falls Date Initiated: 06/28/2019 Target Resolution Date: 09/21/2019 Goal Status: Active Interventions: Assess fall risk on admission and as needed Notes: Wound/Skin Impairment Nursing Diagnoses: Impaired tissue integrity Goals: Ulcer/skin breakdown will heal within 14 weeks Date Initiated: 06/28/2019 Target Resolution Date: 09/21/2019 Goal Status: Active Interventions: Assess patient/caregiver ability to obtain necessary supplies Assess patient/caregiver ability to perform ulcer/skin care regimen upon admission and as needed Assess ulceration(s) every visit Notes: Electronic Signature(s) Signed: 10/24/2019 3:26:34 PM By: Rodell PernaScott, Dajea Entered By: Rodell PernaScott, Dajea on 10/24/2019 14:24:17 [name], Peachie V. ([id number]) -------------------------------------------------------------------------------- Pain Assessment Details Patient Name: [name], Janaysha V. Date of Service: 10/24/2019 2:00 PM Medical Record Number: [id number] Patient Account Number: [id number] Date of Birth/Sex: 09/06/1930 (85 y.o. F) Treating RN: Rodell PernaScott, Dajea Primary Care [name] [name]: Aram BeechamSparks, Jeffrey Other Clinician: Referring Deshauna Cayson: Aram BeechamSparks, Jeffrey Treating Chaya Dehaan/Extender: Linwood DibblesSTONE III, HOYT Weeks in Treatment: 16 Active Problems Location of Pain Severity and Description of Pain Patient Has Paino No Site Locations Pain Management and Medication Current Pain Management: Electronic Signature(s) Signed: 10/24/2019 2:48:23 PM By: Sallee ProvencalWallace, RCP,RRT,CHT, Sallie RCP, RRT, CHT Signed: 10/24/2019 3:26:34 PM By: Rodell PernaScott, Dajea Entered By: Dayton MartesWallace, RCP,RRT,CHT, Sallie on 10/24/2019 13:54:40 [name], Travia V. ([id number]) -------------------------------------------------------------------------------- Patient/Caregiver  Education Details Patient Name: [name], Danaly V. Date of Service: 10/24/2019 2:00 PM Medical Record Number: [id number] Patient Account Number: [id number] Date of Birth/Gender: 03/23/1930 (85 y.o. F) Treating RN: Rodell PernaScott, Dajea Primary Care Physician: Aram BeechamSparks, Jeffrey Other Clinician: Referring Physician: Aram BeechamSparks, Jeffrey Treating Physician/Extender: Skeet SimmerSTONE III, HOYT Weeks in Treatment: 16 Education Assessment Education Provided To: Patient Education Topics Provided Wound/Skin Impairment: Handouts: Caring for Your Ulcer Methods: Demonstration, Explain/Verbal Responses: State content correctly Electronic Signature(s) Signed: 10/24/2019 3:26:34 PM By: Rodell PernaScott, Dajea Entered By: Rodell PernaScott, Dajea on 10/24/2019 14:32:52 [name], Rhea V. ([id number]) -------------------------------------------------------------------------------- Wound Assessment Details Patient Name: [name], Tinley V. Date of Service: 10/24/2019 2:00 PM Medical Record Number: [id number] Patient Account Number: [id number] Date of Birth/Sex: 07/28/1930 (85 y.o. F) Treating RN: Huel CoventryWoody, Kim Primary Care Andrianna Manalang: Aram BeechamSparks, Jeffrey Other Clinician: Referring Ebba Goll: Aram BeechamSparks, Jeffrey Treating Miche Loughridge/Extender: Linwood DibblesSTONE III, HOYT Weeks in Treatment: 16 Wound Status Wound Number: 11 Primary Skin Tear Etiology: Wound Location: Right Lower Leg - Anterior Wound Open Wounding Event: Trauma Status: Date Acquired:  06/27/2019 Comorbid Cataracts, Glaucoma, Optic Neuritis, Chronic Weeks Of Treatment: 16 History: sinus problems/congestion, Middle ear problems Clustered Wound: No Photos Wound Measurements Length: (cm) 2 Width: (cm) 2 Depth: (cm) 0.1 Area: (cm) 3.142 Volume: (cm) 0.314 % Reduction in Area: 70.6% % Reduction in Volume: 94.1% Epithelialization: None Tunneling: No Undermining: No Wound Description Full Thickness Without Exposed Support Classification: Structures Wound Margin: Flat and  Intact Exudate Medium Amount: Exudate Type: Serous Exudate Color: amber Foul Odor After Cleansing: No Slough/Fibrino Yes Wound Bed Granulation Amount: Medium (34-66%) Exposed Structure Granulation Quality: Red Fascia Exposed: No Necrotic Amount: Medium (34-66%) Fat Layer (Subcutaneous Tissue) Exposed: Yes Necrotic Quality: Eschar, Adherent Slough Tendon Exposed: No Muscle Exposed: No Joint Exposed: No Bone Exposed: No DUNIA, PRINGLE ([id number]) Treatment Notes Wound #11 (Right, Anterior Lower Leg) Notes hydrafera, conform, tubi grip F Electronic Signature(s) Signed: 10/25/2019 3:15:39 PM By: Elliot Gurney, BSN, RN, CWS, Kim RN, BSN Entered By: Elliot Gurney, BSN, RN, CWS, Kim on 10/24/2019 14:03:52 Natasha Basset ([id number]) -------------------------------------------------------------------------------- Vitals Details Patient Name: Natasha Basset Date of Service: 10/24/2019 2:00 PM Medical Record Number: [id number] Patient Account Number: [id number] Date of Birth/Sex: 1930/09/29 (85 y.o. F) Treating RN: Rodell Perna Primary Care Demia Viera: Aram Beecham Other Clinician: Referring Raysean Graumann: Aram Beecham Treating Alyric Parkin/Extender: Linwood Dibbles, HOYT Weeks in Treatment: 16 Vital Signs Time Taken: 13:52 Temperature (F): 98.5 Height (in): 60 Pulse (bpm): 72 Weight (lbs): 125 Respiratory Rate (breaths/min): 16 Body Mass Index (BMI): 24.4 Blood Pressure (mmHg): 132/52 Reference Range: 80 - 120 mg / dl Electronic Signature(s) Signed: 10/24/2019 2:48:23 PM By: Dayton Martes RCP, RRT, CHT Entered By: Dayton Martes on 10/24/2019 13:56:24

## 2019-10-28 ENCOUNTER — Ambulatory Visit: Payer: Medicare Other

## 2019-10-30 ENCOUNTER — Ambulatory Visit: Payer: Medicare Other

## 2019-10-31 ENCOUNTER — Other Ambulatory Visit: Payer: Self-pay

## 2019-10-31 ENCOUNTER — Encounter: Payer: Medicare Other | Admitting: Physician Assistant

## 2019-10-31 DIAGNOSIS — L97812 Non-pressure chronic ulcer of other part of right lower leg with fat layer exposed: Secondary | ICD-10-CM | POA: Diagnosis not present

## 2019-10-31 NOTE — Progress Notes (Addendum)
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 wound is stable with no overt evidence of infection. She has counseled with her rheumatologist. She is wanting to restart her methotrexate; a culture was obtained to rule out occult infection before starting her methotrexate. We will continue with Iodosorb/Iodoflex and she will follow-up next week. 08/01/18; this is a difficult wound over her left olecranon process.  There is been concerned about infection although cultures including one done last week were negative. Pending 3 weeks ago I gave her an empiric course of antibiotics. She is having a lot of rheumatologic pain in her hands with pain and stiffness. She wants to go on her weekly methotrexate and I think it would be reasonable to do so. We have been using Iodoflex 08/01/18; difficult wound over her left olecranon process. She started back on methotrexate last week because of rheumatologic pain in her hands. We have been using Iodoflex to try and clean out the wound bed. She has been approved for Graphix PL 08/15/18; 2 week follow-up. Difficult wound over her left olecranon process. Graphix PL #1 with collagen backing 08/22/18; one-week follow-up. Difficult wound over her left olecranon process. Graphix PL #2 08/29/18; no major improvement. Difficult wound over her left olecranon process. Still considerable undermining. Graphics PL #3 1 week follow-up. Graphix #4 09/12/18 graphics #5. Some improvement in wound area although the undermining superiorly still has not closed down as much as I would like 09/19/18; Graphix #6 I think there is improvement in the undermining from 7 to 9:00. Wound bed looks healthy. 09/26/18 Graffix #7 undermining is 0.5 cm maximally at roughly 8:00. From 12 to 7:00 the tissue is adherent which is a major improvement there is some advancing skin from this side. 10/03/18; Graphix #8 no major changes from last week 10/10/18 Graffix #9 There are improvements. There appears to be granulation coming up to the surface here and there is a lot less undermining at 8:00. 10/17/18. Graffix #10; Dimensions are improved less undermining surface felt the but the wound is still open. Initially a surgical wound following a bursectomy 10/24/18; Graffix #11. This is really stalled over the last 2 weeks. If there is no further improvement this will be the last application.The final option for this  difficult area would be plastic surgery and will set up a consult with Dr. Marina Goodell in [facility] 10/31/18; wound looks about the same. The undermining superiorly is 0.7 cm. On the lateral edges perhaps some improvement there is no drainage. 11-07-2018 patient seen today for follow-up and management of left elbow wound.  She has completed a total of 11 treatments of the graffix with not much improvement. She has an upcoming appointment with plastic surgery to assist with additional treatment options for the left elbow wound on 11/19/18. There is significant amount surrounding undermining of the wound is 0.9 cm. Currently prescribed methotrexate. Wound is being treated with Indoform and border dressing. No drainage from wound. No fever, chills. or pain. 11/21/18; the patient continues to have the wound looking roughly the same with undermining from about 12 to 6:00. This has not changed all that much. She does have skin irritation around the wound that looks like drainage maceration issues. The patient states that she was not able to have her wound dressing changed because of illness in the person he usually does this. She also did not attend her clinic appointment today with Dr. Marina Goodell because of transportation issues. She is rebooked for some time in mid January 11/28/2018; the patient has less undermining using endoform. As a understandings she saw Dr. Thad Ranger who is Dr. Lonni Fix partner. He recommended putting her in a elbow brace and I believe is written a prescription for it. He also recommended Motrin 800 mg 3 times daily. This is prescription strength ibuprofen although he did not write his prescription. This apparently was for 2 weeks. Culture I did last time grew a few methicillin sensitive staph aureus. After some difficulty due to drug intolerances/allergies and drug interactions I settled on a 5-day course of azithromycin [name], [name] ([id number]) 12/03/18 on evaluation today  patient actually appears to be doing fairly well in regard to her elbow when compared to last time I evaluated her. With that being said there does not appear to be any signs of infection at this time. That was the big concern currently as far as the patient was concerned. Nonetheless I do feel like she is making progress in regard to the feeling of this ulcer it has been slow. She did see a Engineer, petroleum they are talking about putting her in a brace in order to allow this area to heal more appropriately. 12/19/2018; not much change in this from the last time I have saw this.'s much smaller area than when she first came in and with less circumferential undermining however this is never really adhered. She is wearing the brace that was given or prescribed to her by plastics. She did not have a procedure offered to attempt to close this. We have been using endoform 1/15; wound actually is not doing as well as last week. She was actually not supposed to come into this clinic again until next week but apparently her attendant noticed some redness increasing pain and she came in early. She reports the same amount of drainage. We have been using endoform. She is approved through puraply however I will only consider starting that next week 1/22; she completed the antibiotics last week. Culture I did was negative. In spite of this there is less erythema and pain complaints in the wound. Puraply #1 applied today 1/29; Puraply #2 today. Wound surface looks a lot better post debridement of adherent fibrinous material. However undermining from 6-12 is measuring worse 2/5; Puraply #3. Using her elbow brace 2/12 puraply #4 2/19 puraply #5. The 9:00 undermining measured at 0.5 cm. Undermining from 4-11 o'clock. Surface of the wound looks better and the circumference of the wound is smaller however the undermining is not really changed 2/26; still not much improvement. She has undermining from 4-9 o'clock 0.9  cm.  Surface of the wound covered and adherent debris. 3/4; still no improvement. Undermining from 4-9 o'clock still around a centimeter. Surface of the wound looks somewhat better. No debridement is required we used endoform after we ended the trial of puraply last week 3/11; really no improvement at all. Still 1 cm undermining from roughly 9-3 o'clock. This is about a centimeter. The base of the wound looks fairly healthy. No debridement. We have been using endoform. I am really out of most usual options here. I could consider either another round of an amniotic advanced treatment product example epifix or perhaps regranex. Understandably the patient is a bit frustrated. We did send her to plastic surgery for a consult. Other than prescribing her a brace to immobilize the elbow they did not think she was a candidate for any further surgery. Notable that the patient is not using the brace today 3/18-Patient returns for attention to the left elbow area which apparently looked red at the home health visit. Patient's elbow looks the same if not better compared with last visit. The area of ulceration remains the same, the base appears healthy. We are continuing to use endoform she has been encouraged to use the brace to keep the elbow straight 3/25; the patient has an appointment at the [facility] wound care center on 4/2. I had actually put her out indefinitely however she seems to want to come back here every week. This week she complains of increased pain and malodor. Dimensions of the elbow wound are larger. We have been using endoform 4/8; the patient went to [facility] where they apparently gave her meta honey and some border foam with a Tubigrip. She has been using this for a week. She says she was very impressed with them there. They did not offer her any surgical consultation. She seems to be coming back here for a second opinion on this, she does not wish to drive to [facility] every week 4/09; still using  Medihoney foam border and a Tubigrip. Actually do not think she has anything on the arm at all specifically she is not using her brace 5/6; she has been using medihoney without a lot of improvement. Undermining maximum at 9:00 at 0.5 cm may be somewhat better. She is still complaining of discomfort. She uses her elbow brace at night but is not using anything on the arm during the day, she finds it too restrictive 5/13; we switch the patient to endoform AG last week. She is complaining of more pain and worried about some circumferential erythema around the wound. I had planned to consider epifix in this wound however the patient came in with a request to see a plastic surgeon in [place] by the name of Dr. Wyline Mood who cared for a friend of hers. Noteworthy that I have already sent her to one plastic surgeon and she went for another second opinion at [facility] and they apparently did not send her to a plastic surgeon nevertheless the patient is fairly convinced that she might benefit from a skin graft which I am doubtful. She also has rheumatoid arthritis and is on methotrexate. This was originally a surgical wound for a bursectomy a year or 2 ago Readmission: 06/28/19 on evaluation today patient presents today for reevaluation but this is due to a new issue her elbow has completely healed and looks excellent. Her right anterior lower leg has a skin tear which was sustained from her dog who jumped up on her and inadvertently scratch the area causing the  skin tear. This happened yesterday. She is having some discomfort but fortunately nothing too significant which is good news. No fevers, chills, nausea, or vomiting noted at this time. The skin fortunately was knocked one completely off and we are gonna see about we approximate in the skin as best we can in using Steri-Strips to hold this in place obviously if we can get some of this to reattach that would be beneficial [name], [name].  ([id number]) 07/05/2019 on evaluation today patient actually appears to be doing okay although unfortunately the skin flap that we were attempting to Steri-Stripped down last week did not take. She is developing a lot of fluid underneath the wound area unfortunately which again is not ideal. I think that this necrotic tissue needs to be removed and again it actually just wiped off during the evaluation today as I was attempting to clean the wound there did not appear to be any significant issues underlying which is good although there was some purulent drainage I did want to go ahead and see about obtaining a culture from today in order to ensure that the Z-Pak that I placed her on earlier in the week was appropriate for treating what ever infection may be causing the issue currently. This was a deep wound culture obtained today. 07/12/2019 on evaluation today patient actually appears to be doing quite well with regard to her right lower extremity ulcer all things considering. There is still little bit of hematoma not it that is noted in the central portion of the wound along with some necrotic tissue but she is still having quite a bit of discomfort. For that reason I did not perform sharp debridement today although this wound does need some debridement of one type or another. I think we may attempt Iodoflex to see if this can be of benefit. Fortunately there is no signs of active infection at this point. 07/18/19 upon evaluation today patient appears to be doing better with regard to her ulcer on her right lower extremity. She's been tolerating the dressing changes without complication. The good news is she seems to be making excellent progress. Overall I'm pleased with there being no signs of infection. With that being said she does tell me that she's been having some discomfort with the wrap she's unsure of exactly what about the rafters causing her trouble. 07/25/2019 on evaluation today patient  appears to be doing a little better in regard to her lower extremity ulcer. Unfortunately the alginate seems to be getting really stuck to the wound bed and surrounding periwound. Fortunately there is no signs of active infection at this time. There does not appear to be any evidence of infection currently. 08/01/2019 on evaluation today patient actually appears to be doing much better with regard to her right lower extremity ulcer. She has been tolerating the dressing changes without complication. Fortunately her wound does not show any signs of infection and seems to be making good progress. No fevers, chills, nausea, vomiting, or diarrhea. 08/08/2019 on evaluation today patient actually appears to be doing better with regard to her leg ulcer. She has been tolerating the dressing changes without complication. With that being said I think we may want to switch the dressing up today just based on the appearance of the wound bed in general. Fortunately there is no evidence of infection currently. Her pain seems to be much better. Her son is present with her today. 08/15/2019 on evaluation today patient appears to be doing very well  with regard to her right lower extremity ulcer. She still complains about feeling like the compression stocking/Tubigrip is too tight for her. Nonetheless I still think this is beneficial and is helping the wound to heal more effectively. Overall I am extremely pleased at this time with what I am seeing. 08/22/2019 on evaluation today patient actually appears to be doing quite well with regard to her right lower extremity. She has been tolerating the dressing changes without complication. Fortunately there is no signs of active infection at this time. She still complains about the Tubigrip but nonetheless I think this is something that is of utmost importance for her to continue to wear if she is going to see this area heal appropriately. 08/29/2019 on evaluation today patient  actually appears to be doing well visually in regard to her wound. Unfortunately in regard to overall pain she seems to be having more pain today which I am somewhat concerned about. There does not appear to be any signs of active infection that I can tell but again with increased pain that is definitely a concern here today. I do think it may be time to switch up the dressing as well to something that will be a little bit more effective hopefully and new tissue growth she has done well with collagen in the past. 09/05/2019 on evaluation today patient actually appears to be doing well with regard to her leg ulcer. She has been tolerating the dressing changes without complication. Fortunately there is no signs of active infection. We did obtain a wound culture last week due to the fact that she still is very touchy as far as the surface of the wound is concerned. Unfortunately I am not sure what happened to that culture as there is no record in the computer system. Nonetheless we need to follow-up on this and try to see if we can identify what exactly happened with this specimen as we have not gotten a result back either obviously. 09/12/2019 on evaluation today patient actually appears to be showing signs of good improvement upon evaluation today. She states her pain is not nearly as bad as what it was which is also good news. She did have a wound culture which was reviewed today that showed no growth of bacteria which is good news. This coupled with the fact that her wound appears to be healing better, measuring smaller, and overall even the slough buildup is minimal compared to what we have seen previously, I feel like she is actually showing signs of excellent improvement today. [name], [name] ([id number]) 09/19/2019 on evaluation today patient appears to be doing well with regard to her wounds on evaluation today. She is showing signs of continued improvement week by week and everything is  measuring significantly smaller which is great news. Overall very pleased with the progress that she has made. 09/26/2019 on evaluation today patient appears to be doing fairly well with regard to her right lower extremity ulcer. She does continue to have some drainage and again the smaller of the 2 wounds is not making as much progress as I like to see. We have been doing the silver collagen for some time I think we may want to switch to [facility] she has a little bit of hyper granular tissue this may help to keep things from becoming too dramatic in that regard. Fortunately there is no signs of infection at this time. 10/03/2019 on evaluation today patient appears to be doing fairly well with regard to  her lower extremity ulcers at this time. She has been tolerating the dressing changes without complication. Fortunately the wound is measuring smaller which is great news she continues to make great progress. 10/17/2019 on evaluation today patient appears to be doing well with regard to her wounds of her right lower extremity. She states she has been having some increased pain apparently we gave her a little bit smaller size Tubigrip last time she was here this may have been what was causing some issues. She is out of the Tubigrip that we had ordered for her and therefore did not have any of the smaller size. Fortunately there is no evidence of active infection at this time. No fever chills noted 10/24/2019 upon evaluation today patient's wounds actually appear to be showing signs of good improvement which is excellent news. She has been tolerating the dressing changes without complication and overall I am rather pleased at this point with how things seem to be progressing. The patient likewise is very happy. She still was not happy with having to use the Tubigrip she states that bunches up in her shoe. 10/31/2019 on evaluation today patient actually appears to be doing quite well with regard  to her leg ulcer. She has been tolerating the dressing changes without complication. Fortunately there is no signs of active infection at this time. Overall I am very pleased with the progress that she is made. She does wonder if we could switch to the bordered adhesive Hydrofera Blue I think that something we can definitely look into for her. Patient History Information obtained from Patient. Family History Heart Disease - Siblings,Father, No family history of Cancer, Diabetes, Hereditary Spherocytosis, Hypertension, Kidney Disease, Lung Disease, Seizures, Stroke, Thyroid Problems, Tuberculosis. Social History Never smoker, Alcohol Use - Daily, Drug Use - No History, Caffeine Use - Never. Medical History Eyes Patient has history of Cataracts, Glaucoma, Optic Neuritis Ear/Nose/Mouth/Throat Patient has history of Chronic sinus problems/congestion, Middle ear problems Medical And Surgical History Notes Constitutional Symptoms (General Health) Back pain Ear/Nose/Mouth/Throat bilateral hearing aides Review of Systems (ROS) Constitutional Symptoms (General Health) Denies complaints or symptoms of Fatigue, Fever, Chills, Marked Weight Change. Respiratory Denies complaints or symptoms of Chronic or frequent coughs, Shortness of Breath. Cardiovascular KEERTHANA, VANROSSUM ([id number]) Denies complaints or symptoms of Chest pain, LE edema. Psychiatric Denies complaints or symptoms of Anxiety, Claustrophobia. Objective Constitutional Well-nourished and well-hydrated in no acute distress. Vitals Time Taken: 2:05 PM, Height: 60 in, Weight: 125 lbs, BMI: 24.4, Temperature: 98.5 F, Pulse: 66 bpm, Respiratory Rate: 16 breaths/min, Blood Pressure: 157/48 mmHg. Respiratory normal breathing without difficulty. clear to auscultation bilaterally. Cardiovascular regular rate and rhythm with normal S1, S2. Psychiatric this patient is able to make decisions and demonstrates good insight into disease  process. Alert and Oriented x 3. pleasant and cooperative. General Notes: Patient's wound bed currently showed signs of good granulation at this time and good epithelization. It did require some debridement to clear away some of the necrotic tissue around the edge of the wound as well as the surface of the wound to free up the edges and the base to allow for additional epithelization. She tolerated this without complication post debridement the wound bed appears to be doing much better and this is excellent news. Integumentary (Hair, Skin) Wound #11 status is Open. Original cause of wound was Trauma. The wound is located on the Right,Anterior Lower Leg. The wound measures 1.2cm length x 2cm width x 0.1cm depth; 1.885cm^2 area and 0.188cm^3 volume. There  is Fat Layer (Subcutaneous Tissue) Exposed exposed. There is no tunneling or undermining noted. There is a medium amount of serous drainage noted. The wound margin is flat and intact. There is large (67-100%) red granulation within the wound bed. There is a small (1-33%) amount of necrotic tissue within the wound bed including Adherent Slough. Assessment Active Problems ICD-10 Unspecified open wound, right lower leg, initial encounter Non-pressure chronic ulcer of other part of right lower leg with fat layer exposed Struck by dog, initial encounter [name], [name] ([id number]) Procedures Wound #11 Pre-procedure diagnosis of Wound #11 is a Skin Tear located on the Right,Anterior Lower Leg . There was a Excisional Skin/Subcutaneous Tissue Debridement with a total area of 2.4 sq cm performed by STONE III, HOYT E., PA-C. With the following instrument(s): Curette to remove Viable and Non-Viable tissue/material. Material removed includes Subcutaneous Tissue and Slough and. A time out was conducted at 14:29, prior to the start of the procedure. A Minimum amount of bleeding was controlled with Pressure. The procedure was tolerated well. Post  Debridement Measurements: 1.2cm length x 2cm width x 0.1cm depth; 0.188cm^3 volume. Character of Wound/Ulcer Post Debridement is stable. Post procedure Diagnosis Wound #11: Same as Pre-Procedure Plan Wound Cleansing: Wound #11 Right,Anterior Lower Leg: Clean wound with Normal Saline. Primary Wound Dressing: Wound #11 Right,Anterior Lower Leg: Hydrafera Blue Ready Transfer - bordered foam Dressing Change Frequency: Wound #11 Right,Anterior Lower Leg: Change dressing every other day. Follow-up Appointments: Wound #11 Right,Anterior Lower Leg: Return Appointment in 2 weeks. Edema Control: Wound #11 Right,Anterior Lower Leg: Other: - tubi grip F 1 my suggestion currently is good to be that we go ahead and continue with [facility] although we will try the bordered Hydrofera Blue dressing today to see if this aggravates her skin in any way. If it does then she will take that off and put one of the normal dressings on we gave that to her as well if it does not she will call us Monday we will see about getting that ordered for her. 2. I am to recommend that she still continue to wear either the Tubigrip or the compression socks and that something that I did give her actually measurements for today to contact elastic therapy in [place] to see about ordering mild compression socks. I think the 10-15 range would be perfect for her. We will see patient back for reevaluation in 2 weeks here in the clinic. If anything worsens or changes patient will contact our office for additional recommendations. Electronic Signature(s) Signed: 10/31/2019 2:44:26 PM By: Lenda Kelp PA-C Entered By: Lenda Kelp on 10/31/2019 14:44:26 [name] ([id number]) [address] Lawernce Keas ([id number]) -------------------------------------------------------------------------------- ROS/PFSH Details Patient Name: [name] Date of Service: 10/31/2019 2:00 PM Medical Record Number: [id number] Patient  Account Number: [id number] Date of Birth/Sex: 25-Apr-1930 (85 y.o. F) Treating RN: Rodell Perna Primary Care Provider: Aram [place] Other Clinician: Referring Provider: Aram Beecham Treating Provider/Extender: Linwood Dibbles, HOYT Weeks in Treatment: 17 Information Obtained From Patient Constitutional Symptoms (General Health) Complaints and Symptoms: Negative for: Fatigue; Fever; Chills; Marked Weight Change Medical History: Past Medical History Notes: Back pain Respiratory Complaints and Symptoms: Negative for: Chronic or frequent coughs; Shortness of Breath Cardiovascular Complaints and Symptoms: Negative for: Chest pain; LE edema Psychiatric Complaints and Symptoms: Negative for: Anxiety; Claustrophobia Eyes Medical History: Positive for: Cataracts; Glaucoma; Optic Neuritis Ear/Nose/Mouth/Throat Medical History: Positive for: Chronic sinus problems/congestion; Middle ear problems Past Medical History Notes: bilateral hearing aides HBO Extended  History Items Ear/Nose/Mouth/Throat: Eyes: Eyes: Ear/Nose/Mouth/Throat: Chronic sinus Cataracts Glaucoma Middle ear problems problems/congestion Immunizations Pneumococcal Vaccine: Received Pneumococcal Vaccination: No Implantable Devices [name], [name] ([id number]) Yes Family and Social History Cancer: No; Diabetes: No; Heart Disease: Yes - Siblings,Father; Hereditary Spherocytosis: No; Hypertension: No; Kidney Disease: No; Lung Disease: No; Seizures: No; Stroke: No; Thyroid Problems: No; Tuberculosis: No; Never smoker; Alcohol Use: Daily; Drug Use: No History; Caffeine Use: Never; Financial Concerns: No; Food, Clothing or Shelter Needs: No; Support System Lacking: No; Transportation Concerns: No Physician Affirmation I have reviewed and agree with the above information. Electronic Signature(s) Signed: 11/01/2019 8:45:19 AM By: Rodell Perna Signed: 11/01/2019 1:53:04 PM By: Lenda Kelp PA-C Entered By: Lenda Kelp on 10/31/2019 14:42:57 [name] ([id number]) -------------------------------------------------------------------------------- SuperBill Details Patient Name: [name] Date of Service: 10/31/2019 Medical Record Number: [id number] Patient Account Number: [id number] Date of Birth/Sex: 09-12-1930 (85 y.o. F) Treating RN: Rodell Perna Primary Care Provider: Aram Beecham Other Clinician: Referring Provider: Aram Beecham Treating Provider/Extender: Linwood Dibbles, HOYT Weeks in Treatment: 17 Diagnosis Coding ICD-10 Codes Code Description S81.801A Unspecified open wound, right lower leg, initial encounter L97.812 Non-pressure chronic ulcer of other part of right lower leg with fat layer exposed W54.1XXA Struck by dog, initial encounter Facility Procedures CPT4 Code Description: 62952841 779 071 1409 - DEB SUBQ TISSUE 20 SQ CM/< ICD-10 Diagnosis Description L97.812 Non-pressure chronic ulcer of other part of right lower leg wit Modifier: h fat layer expo Quantity: 1 sed Physician Procedures CPT4 Code Description: 1027253 11042 - WC PHYS SUBQ TISS 20 SQ CM ICD-10 Diagnosis Description L97.812 Non-pressure chronic ulcer of other part of right lower leg wit Modifier: h fat layer expo Quantity: 1 sed Electronic Signature(s) Signed: 10/31/2019 2:46:36 PM By: Lenda Kelp PA-C Entered By: Lenda Kelp on 10/31/2019 14:46:36

## 2019-10-31 NOTE — Progress Notes (Addendum)
[name], [name] ([id number]) Visit Report for 10/31/2019 Arrival Information Details Patient Name: [name], [name] Date of Service: 10/31/2019 2:00 PM Medical Record Number: [id number] Patient Account Number: [id number] Date of Birth/Sex: 02-05-1930 (85 y.o. F) Treating RN: Army Melia Primary Care Dajia Gunnels: Fulton Reek Other Clinician: Referring Treina Arscott: Fulton Reek Treating Fortune Brannigan/Extender: Melburn Hake, HOYT Weeks in Treatment: 30 Visit Information History Since Last Visit Added or deleted any medications: No Patient Arrived: Walker Any new allergies or adverse reactions: No Arrival Time: 14:06 Had a fall or experienced change in No Accompanied By: self activities of daily living that may affect Transfer Assistance: None risk of falls: Patient Identification Verified: Yes Signs or symptoms of abuse/neglect since last visito No Secondary Verification Process Completed: Yes Hospitalized since last visit: No Patient Requires Transmission-Based Precautions: No Implantable device outside of the clinic excluding No Patient Has Alerts: No cellular tissue based products placed in the center since last visit: Pain Present Now: No Electronic Signature(s) Signed: 10/31/2019 3:09:53 PM By: Lorine Bears RCP, RRT, CHT Entered By: Lorine Bears on 10/31/2019 14:07:38 [name] ([id number]) -------------------------------------------------------------------------------- Encounter Discharge Information Details Patient Name: [name] Date of Service: 10/31/2019 2:00 PM Medical Record Number: [id number] Patient Account Number: [id number] Date of Birth/Sex: 09/09/1930 (85 y.o. F) Treating RN: Army Melia Primary Care Torris House: Fulton Reek Other Clinician: Referring Becka Lagasse: Fulton Reek Treating Liron Eissler/Extender: Melburn Hake, HOYT Weeks in Treatment: 41 Encounter Discharge Information Items Post Procedure Vitals Discharge  Condition: Stable Temperature (F): 98.5 Ambulatory Status: Unstable Pulse (bpm): 66 Discharge Destination: Home Respiratory Rate (breaths/min): 16 Transportation: Private Auto Blood Pressure (mmHg): 157/48 Accompanied By: self Schedule Follow-up Appointment: Yes Clinical Summary of Care: Electronic Signature(s) Signed: 11/01/2019 8:45:19 AM By: Army Melia Entered By: Army Melia on 10/31/2019 14:36:30 [name] ([id number]) -------------------------------------------------------------------------------- Lower Extremity Assessment Details Patient Name: [name] Date of Service: 10/31/2019 2:00 PM Medical Record Number: [id number] Patient Account Number: [id number] Date of Birth/Sex: 07-30-1930 (85 y.o. F) Treating RN: Montey Hora Primary Care Chucky Homes: Fulton Reek Other Clinician: Referring Markos Theil: Fulton Reek Treating Jake Goodson/Extender: Melburn Hake, HOYT Weeks in Treatment: 17 Edema Assessment Assessed: [Left: No] [Right: No] Edema: [Left: Ye] [Right: s] Calf Left: Right: Point of Measurement: 30 cm From Medial Instep cm 33.5 cm Ankle Left: Right: Point of Measurement: 10 cm From Medial Instep cm 21.5 cm Vascular Assessment Pulses: Dorsalis Pedis Palpable: [Right:Yes] Electronic Signature(s) Signed: 10/31/2019 4:34:34 PM By: Montey Hora Entered By: Montey Hora on 10/31/2019 14:11:19 [name] ([id number]) -------------------------------------------------------------------------------- Multi Wound Chart Details Patient Name: [name] Date of Service: 10/31/2019 2:00 PM Medical Record Number: [id number] Patient Account Number: [id number] Date of Birth/Sex: 11/29/1930 (85 y.o. F) Treating RN: Army Melia Primary Care Aalani Aikens: Fulton Reek Other Clinician: Referring Catrina Fellenz: Fulton Reek Treating Ishitha Roper/Extender: Melburn Hake, HOYT Weeks in Treatment: 17 Vital Signs Height(in): 60 Pulse(bpm): 68 Weight(lbs):  125 Blood Pressure(mmHg): 157/48 Body Mass Index(BMI): 24 Temperature(F): 98.5 Respiratory Rate 16 (breaths/min): Photos: [N/A:N/A] Wound Location: Right Lower Leg - Anterior N/A N/A Wounding Event: Trauma N/A N/A Primary Etiology: Skin Tear N/A N/A Comorbid History: Cataracts, Glaucoma, Optic N/A N/A Neuritis, Chronic sinus problems/congestion, Middle ear problems Date Acquired: 06/27/2019 N/A N/A Weeks of Treatment: 17 N/A N/A Wound Status: Open N/A N/A Measurements L x W x D 1.2x2x0.1 N/A N/A (cm) Area (cm) : 1.885 N/A N/A Volume (cm) : 0.188 N/A N/A % Reduction in Area: 82.40% N/A N/A % Reduction in Volume: 96.50% N/A N/A Classification: Full Thickness  Without N/A N/A Exposed Support Structures Exudate Amount: Medium N/A N/A Exudate Type: Serous N/A N/A Exudate Color: amber N/A N/A Wound Margin: Flat and Intact N/A N/A Granulation Amount: Large (67-100%) N/A N/A Granulation Quality: Red N/A N/A Necrotic Amount: Small (1-33%) N/A N/A Exposed Structures: Fat Layer (Subcutaneous N/A N/A Tissue) Exposed: Yes Fascia: No Tendon: No [name], [name] ([id number]) Muscle: No Joint: No Bone: No Epithelialization: None N/A N/A Treatment Notes Electronic Signature(s) Signed: 11/01/2019 8:45:19 AM By: Rodell Perna Entered By: Rodell Perna on 10/31/2019 14:26:43 [name] ([id number]) -------------------------------------------------------------------------------- Multi-Disciplinary Care Plan Details Patient Name: [name] Date of Service: 10/31/2019 2:00 PM Medical Record Number: [id number] Patient Account Number: [id number] Date of Birth/Sex: [birth year] (85 y.o. F) Treating RN: Rodell Perna Primary Care Mohamadou Maciver: Aram Beecham Other Clinician: Referring Korrine Sicard: Aram Beecham Treating Zaviyar Rahal/Extender: Linwood Dibbles, HOYT Weeks in Treatment: 69 Active Inactive Abuse / Safety / Falls / Self Care Management Nursing Diagnoses: Potential for  falls Goals: Patient will remain injury free related to falls Date Initiated: 06/28/2019 Target Resolution Date: 09/21/2019 Goal Status: Active Interventions: Assess fall risk on admission and as needed Notes: Wound/Skin Impairment Nursing Diagnoses: Impaired tissue integrity Goals: Ulcer/skin breakdown will heal within 14 weeks Date Initiated: 06/28/2019 Target Resolution Date: 09/21/2019 Goal Status: Active Interventions: Assess patient/caregiver ability to obtain necessary supplies Assess patient/caregiver ability to perform ulcer/skin care regimen upon admission and as needed Assess ulceration(s) every visit Notes: Electronic Signature(s) Signed: 11/01/2019 8:45:19 AM By: Rodell Perna Entered By: Rodell Perna on 10/31/2019 14:26:34 [name] ([id number]) -------------------------------------------------------------------------------- Pain Assessment Details Patient Name: [name] Date of Service: 10/31/2019 2:00 PM Medical Record Number: [id number] Patient Account Number: [id number] Date of Birth/Sex: 02-27-1930 (85 y.o. F) Treating RN: Rodell Perna Primary Care Amaka Gluth: Aram Beecham Other Clinician: Referring Terry Abila: Aram Beecham Treating Quina Wilbourne/Extender: Linwood Dibbles, HOYT Weeks in Treatment: 17 Active Problems Location of Pain Severity and Description of Pain Patient Has Paino No Site Locations Pain Management and Medication Current Pain Management: Electronic Signature(s) Signed: 10/31/2019 3:09:53 PM By: Dayton Martes RCP, RRT, CHT Signed: 11/01/2019 8:45:19 AM By: Rodell Perna Entered By: Dayton Martes on 10/31/2019 14:07:46 [name] ([id number]) -------------------------------------------------------------------------------- Patient/Caregiver Education Details Patient Name: [name] Date of Service: 10/31/2019 2:00 PM Medical Record Number: [id number] Patient Account Number: [id number] Date  of Birth/Gender: 12/05/1930 (85 y.o. F) Treating RN: Rodell Perna Primary Care Physician: Aram Beecham Other Clinician: Referring Physician: Aram Beecham Treating Physician/Extender: Skeet Simmer in Treatment: 17 Education Assessment Education Provided To: Patient Education Topics Provided Wound/Skin Impairment: Handouts: Caring for Your Ulcer Methods: Demonstration, Explain/Verbal Responses: State content correctly Electronic Signature(s) Signed: 11/01/2019 8:45:19 AM By: Rodell Perna Entered By: Rodell Perna on 10/31/2019 14:35:27 [name] ([id number]) -------------------------------------------------------------------------------- Wound Assessment Details Patient Name: [name] Date of Service: 10/31/2019 2:00 PM Medical Record Number: [id number] Patient Account Number: [id number] Date of Birth/Sex: [birth year] (85 y.o. F) Treating RN: Curtis Sites Primary Care Deashia Soule: Aram Beecham Other Clinician: Referring Chue Berkovich: Aram Beecham Treating Iyauna Sing/Extender: Linwood Dibbles, HOYT Weeks in Treatment: 17 Wound Status Wound Number: 11 Primary Skin Tear Etiology: Wound Location: Right Lower Leg - Anterior Wound Open Wounding Event: Trauma Status: Date Acquired: 06/27/2019 Comorbid Cataracts, Glaucoma, Optic Neuritis, Chronic Weeks Of Treatment: 17 History: sinus problems/congestion, Middle ear problems Clustered Wound: No Photos Wound Measurements Length: (cm) 1.2 Width: (cm) 2 Depth: (cm) 0.1 Area: (cm) 1.885 Volume: (cm) 0.188 % Reduction in Area: 82.4% % Reduction in Volume: 96.5%  Epithelialization: None Tunneling: No Undermining: No Wound Description Full Thickness Without Exposed Support Classification: Structures Wound Margin: Flat and Intact Exudate Medium Amount: Exudate Type: Serous Exudate Color: amber Foul Odor After Cleansing: No Slough/Fibrino Yes Wound Bed Granulation Amount: Large (67-100%) Exposed  Structure Granulation Quality: Red Fascia Exposed: No Necrotic Amount: Small (1-33%) Fat Layer (Subcutaneous Tissue) Exposed: Yes Necrotic Quality: Adherent Slough Tendon Exposed: No Muscle Exposed: No Joint Exposed: No Bone Exposed: No [name], [name] Kitchen ([id number]) Treatment Notes Wound #11 (Right, Anterior Lower Leg) Notes hydrafera, BFD, tubi grip F Electronic Signature(s) Signed: 10/31/2019 4:34:34 PM By: Curtis Sites Entered By: Curtis Sites on 10/31/2019 14:13:55 [name] ([id number]) -------------------------------------------------------------------------------- Vitals Details Patient Name: [name] Date of Service: 10/31/2019 2:00 PM Medical Record Number: [id number] Patient Account Number: [id number] Date of Birth/Sex: 1930/12/02 (85 y.o. F) Treating RN: Rodell Perna Primary Care Romesha Scherer: Aram Beecham Other Clinician: Referring Akosua Constantine: Aram Beecham Treating Riane Rung/Extender: Linwood Dibbles, HOYT Weeks in Treatment: 17 Vital Signs Time Taken: 14:05 Temperature (F): 98.5 Height (in): 60 Pulse (bpm): 66 Weight (lbs): 125 Respiratory Rate (breaths/min): 16 Body Mass Index (BMI): 24.4 Blood Pressure (mmHg): 157/48 Reference Range: 80 - 120 mg / dl Electronic Signature(s) Signed: 10/31/2019 3:09:53 PM By: Dayton Martes RCP, RRT, CHT Entered By: Dayton Martes on 10/31/2019 14:08:23

## 2019-11-15 ENCOUNTER — Encounter: Payer: Medicare Other | Attending: Physician Assistant | Admitting: Physician Assistant

## 2019-11-15 ENCOUNTER — Other Ambulatory Visit: Payer: Self-pay

## 2019-11-15 ENCOUNTER — Other Ambulatory Visit: Payer: Self-pay | Admitting: Internal Medicine

## 2019-11-15 DIAGNOSIS — S81801A Unspecified open wound, right lower leg, initial encounter: Secondary | ICD-10-CM | POA: Insufficient documentation

## 2019-11-15 DIAGNOSIS — W541XXA Struck by dog, initial encounter: Secondary | ICD-10-CM | POA: Diagnosis not present

## 2019-11-15 DIAGNOSIS — Z1231 Encounter for screening mammogram for malignant neoplasm of breast: Secondary | ICD-10-CM

## 2019-11-15 NOTE — Progress Notes (Signed)
[name], Loura V. ([id number]) Visit Report for 11/15/2019 Arrival Information Details Patient Name: [name], [name] Date of Service: 11/15/2019 11:00 AM Medical Record Number: [id number] Patient Account Number: [id number] Date of Birth/Sex: 02/19/1930 (85 y.o. F) Treating RN: Curtis Sitesorthy, Joanna Primary Care Ranie Chinchilla: Aram BeechamSparks, Jeffrey Other Clinician: Referring Miyonna Ormiston: Aram BeechamSparks, Jeffrey Treating Daviyon Widmayer/Extender: Linwood DibblesSTONE III, HOYT Weeks in Treatment: 20 Visit Information History Since Last Visit Added or deleted any medications: No Patient Arrived: Walker Any new allergies or adverse reactions: No Arrival Time: 11:08 Had a fall or experienced change in No Accompanied By: self activities of daily living that may affect Transfer Assistance: None risk of falls: Patient Identification Verified: Yes Signs or symptoms of abuse/neglect since last visito No Secondary Verification Process Completed: Yes Hospitalized since last visit: No Patient Requires Transmission-Based Precautions: No Implantable device outside of the clinic excluding No Patient Has Alerts: No cellular tissue based products placed in the center since last visit: Has Dressing in Place as Prescribed: Yes Pain Present Now: No Electronic Signature(s) Signed: 11/15/2019 3:46:50 PM By: Dayton MartesWallace, RCP,RRT,CHT, Sallie RCP, RRT, CHT Entered By: Dayton MartesWallace, RCP,RRT,CHT, Sallie on 11/15/2019 11:10:35 [name], [name] ([id number]) -------------------------------------------------------------------------------- Clinic Level of Care Assessment Details Patient Name: [name], [name] Date of Service: 11/15/2019 11:00 AM Medical Record Number: [id number] Patient Account Number: [id number] Date of Birth/Sex: 03/28/1930 (85 y.o. F) Treating RN: Curtis Sitesorthy, Joanna Primary Care Zyrus Hetland: Aram BeechamSparks, Jeffrey Other Clinician: Referring Jahzeel Poythress: Aram BeechamSparks, Jeffrey Treating Sara Keys/Extender: Linwood DibblesSTONE III, HOYT Weeks in Treatment: 20 Clinic Level of Care Assessment  Items TOOL 4 Quantity Score []  - Use when only an EandM is performed on FOLLOW-UP visit 0 ASSESSMENTS - Nursing Assessment / Reassessment X - Reassessment of Co-morbidities (includes updates in patient status) 1 10 X- 1 5 Reassessment of Adherence to Treatment Plan ASSESSMENTS - Wound and Skin Assessment / Reassessment X - Simple Wound Assessment / Reassessment - one wound 1 5 []  - 0 Complex Wound Assessment / Reassessment - multiple wounds []  - 0 Dermatologic / Skin Assessment (not related to wound area) ASSESSMENTS - Focused Assessment []  - Circumferential Edema Measurements - multi extremities 0 []  - 0 Nutritional Assessment / Counseling / Intervention X- 1 5 Lower Extremity Assessment (monofilament, tuning fork, pulses) []  - 0 Peripheral Arterial Disease Assessment (using hand held doppler) ASSESSMENTS - Ostomy and/or Continence Assessment and Care []  - Incontinence Assessment and Management 0 []  - 0 Ostomy Care Assessment and Management (repouching, etc.) PROCESS - Coordination of Care X - Simple Patient / Family Education for ongoing care 1 15 []  - 0 Complex (extensive) Patient / Family Education for ongoing care X- 1 10 Staff obtains ChiropractorConsents, Records, Test Results / Process Orders []  - 0 Staff telephones HHA, Nursing Homes / Clarify orders / etc []  - 0 Routine Transfer to another Facility (non-emergent condition) []  - 0 Routine Hospital Admission (non-emergent condition) []  - 0 New Admissions / Manufacturing engineernsurance Authorizations / Ordering NPWT, Apligraf, etc. []  - 0 Emergency Hospital Admission (emergent condition) X- 1 10 Simple Discharge Coordination [name], [name] ([id number]) []  - 0 Complex (extensive) Discharge Coordination PROCESS - Special Needs []  - Pediatric / Minor Patient Management 0 []  - 0 Isolation Patient Management []  - 0 Hearing / Language / Visual special needs []  - 0 Assessment of Community assistance (transportation, D/C planning, etc.) []  -  0 Additional assistance / Altered mentation []  - 0 Support Surface(s) Assessment (bed, cushion, seat, etc.) INTERVENTIONS - Wound Cleansing / Measurement X - Simple Wound Cleansing - one wound 1 5 []  -  0 Complex Wound Cleansing - multiple wounds X- 1 5 Wound Imaging (photographs - any number of wounds) []  - 0 Wound Tracing (instead of photographs) X- 1 5 Simple Wound Measurement - one wound []  - 0 Complex Wound Measurement - multiple wounds INTERVENTIONS - Wound Dressings X - Small Wound Dressing one or multiple wounds 1 10 []  - 0 Medium Wound Dressing one or multiple wounds []  - 0 Large Wound Dressing one or multiple wounds []  - 0 Application of Medications - topical []  - 0 Application of Medications - injection INTERVENTIONS - Miscellaneous []  - External ear exam 0 []  - 0 Specimen Collection (cultures, biopsies, blood, body fluids, etc.) []  - 0 Specimen(s) / Culture(s) sent or taken to Lab for analysis []  - 0 Patient Transfer (multiple staff / / Similar devices) []  - 0 Simple Staple / Suture removal (25 or less) []  - 0 Complex Staple / Suture removal (26 or more) []  - 0 Hypo / Hyperglycemic Management (close monitor of Blood Glucose) []  - 0 Ankle / Brachial Index (ABI) - do not check if billed separately X- 1 5 Vital Signs [name], [name] ( ) Has the patient been seen at the hospital within the last three years: Yes Total Score: 90 Level Of Care: New/Established - Level 3 Electronic Signature(s) Signed: 11/15/2019 5:08:51 PM By: Entered By: on 11/15/2019 11:35:58 ( ) -------------------------------------------------------------------------------- Encounter Discharge Information Details Patient Name: Date of Service: 11/15/2019 11:00 AM Medical Record Number: Patient Account Number: Date of Birth/Sex: 06-20-1930 (85 y.o. F) Treating RN: Natasha Basset Primary Care Damein Gaunce: [phone] Other Clinician: Referring Tiawana Forgy: 14/03/2019 Treating Crawford Tamura/Extender: Curtis Sites, HOYT Weeks in Treatment: 20 Encounter Discharge Information Items Discharge Condition: Stable Ambulatory Status: Walker Discharge Destination: Home Transportation: Private Auto Accompanied By: self Schedule Follow-up Appointment: Yes Clinical Summary of Care: Electronic Signature(s) Signed: 11/15/2019 5:08:51 PM By: 14/03/2019 Entered By: Natasha Basset on 11/15/2019 11:36:57 Natasha Basset (14/03/2019) -------------------------------------------------------------------------------- Lower Extremity Assessment Details Patient Name: [id number] Date of Service: 11/15/2019 11:00 AM Medical Record Number: 13/05/[id number] Patient Account Number: [id number] Date of Birth/Sex: 22-Oct-1930 (85 y.o. F) Treating RN: Aram Beecham Primary Care Marce Schartz: Linwood Dibbles Other Clinician: Referring Amerie Beaumont: 14/03/2019 Treating Salar Molden/Extender: Curtis Sites, HOYT Weeks in Treatment: 20 Electronic Signature(s) Signed: 11/15/2019 4:16:49 PM By: 14/03/2019 Entered By: Natasha Basset on 11/15/2019 11:17:25 Natasha Basset (14/03/2019) -------------------------------------------------------------------------------- Multi Wound Chart Details Patient Name: [id number] Date of Service: 11/15/2019 11:00 AM Medical Record Number: 13/05/[id number] Patient Account Number: [id number] Date of Birth/Sex: 10/30/1930 (85 y.o. F) Treating RN: Aram Beecham Primary Care Luan Urbani: Linwood Dibbles Other Clinician: Referring Rockie Vawter: 14/03/2019 Treating Ashtan Girtman/Extender: Arnette Norris, HOYT Weeks in Treatment: 20 Vital Signs Height(in): 60 Pulse(bpm): 70 Weight(lbs): 125 Blood Pressure(mmHg): 140/45 Body Mass Index(BMI): 24 Temperature(F): 98.4 Respiratory Rate 16 (breaths/min): Photos: [N/A:N/A] Wound Location: Right Lower Leg - Anterior N/A  N/A Wounding Event: Trauma N/A N/A Primary Etiology: Skin Tear N/A N/A Comorbid History: Cataracts, Glaucoma, Optic N/A N/A Neuritis, Chronic sinus problems/congestion, Middle ear problems Date Acquired: 06/27/2019 N/A N/A Weeks of Treatment: 20 N/A N/A Wound Status: Open N/A N/A Measurements L x W x D 1.2x0.4x0.1 N/A N/A (cm) Area (cm) : 0.377 N/A N/A Volume (cm) : 0.038 N/A N/A % Reduction in Area: 96.50% N/A N/A % Reduction in Volume: 99.30% N/A N/A Classification: Full Thickness Without N/A N/A Exposed Support Structures Exudate Amount: Medium N/A N/A  Exudate Type: Serous N/A N/A Exudate Color: amber N/A N/A Wound Margin: Flat and Intact N/A N/A Granulation Amount: Large (67-100%) N/A N/A Granulation Quality: Red N/A N/A Necrotic Amount: Small (1-33%) N/A N/A Exposed Structures: Fat Layer (Subcutaneous N/A N/A Tissue) Exposed: Yes Fascia: No Tendon: No [name], [name] ([id number]) Muscle: No Joint: No Bone: No Epithelialization: None N/A N/A Treatment Notes Electronic Signature(s) Signed: 11/15/2019 5:08:51 PM By: Montey Hora Entered By: Montey Hora on 11/15/2019 11:30:04 [name] ([id number]) -------------------------------------------------------------------------------- [place] Plan Details Patient Name: [name] Date of Service: 11/15/2019 11:00 AM Medical Record Number: [id number] Patient Account Number: [id number] Date of Birth/Sex: 1930/10/29 (85 y.o. F) Treating RN: Montey Hora Primary Care Jean Alejos: Fulton Reek Other Clinician: Referring Floraine Buechler: Fulton Reek Treating Jaelle Campanile/Extender: Melburn Hake, HOYT Weeks in Treatment: 20 Active Inactive Abuse / Safety / Falls / Self Care Management Nursing Diagnoses: Potential for falls Goals: Patient will remain injury free related to falls Date Initiated: 06/28/2019 Target Resolution Date: 09/21/2019 Goal Status: Active Interventions: Assess fall risk on  admission and as needed Notes: Wound/Skin Impairment Nursing Diagnoses: Impaired tissue integrity Goals: Ulcer/skin breakdown will heal within 14 weeks Date Initiated: 06/28/2019 Target Resolution Date: 09/21/2019 Goal Status: Active Interventions: Assess patient/caregiver ability to obtain necessary supplies Assess patient/caregiver ability to perform ulcer/skin care regimen upon admission and as needed Assess ulceration(s) every visit Notes: Electronic Signature(s) Signed: 11/15/2019 5:08:51 PM By: Montey Hora Entered By: Montey Hora on 11/15/2019 11:29:56 [name] ([id number]) -------------------------------------------------------------------------------- Pain Assessment Details Patient Name: [name] Date of Service: 11/15/2019 11:00 AM Medical Record Number: [id number] Patient Account Number: [id number] Date of Birth/Sex: [birth year] (85 y.o. F) Treating RN: Montey Hora Primary Care Christapher Gillian: Fulton Reek Other Clinician: Referring Dania Marsan: Fulton Reek Treating Pearley Millington/Extender: Melburn Hake, HOYT Weeks in Treatment: 20 Active Problems Location of Pain Severity and Description of Pain Patient Has Paino No Site Locations Pain Management and Medication Current Pain Management: Electronic Signature(s) Signed: 11/15/2019 3:46:50 PM By: Paulla Fore, RRT, CHT Signed: 11/15/2019 5:08:51 PM By: Montey Hora Entered By: Lorine Bears on 11/15/2019 11:10:44 [name] ([id number]) -------------------------------------------------------------------------------- Patient/Caregiver Education Details Patient Name: [name] Date of Service: 11/15/2019 11:00 AM Medical Record Number: [id number] Patient Account Number: [id number] Date of Birth/Gender: 04-Apr-1930 (85 y.o. F) Treating RN: Montey Hora Primary Care Physician: Fulton Reek Other Clinician: Referring Physician: Fulton Reek Treating  Physician/Extender: Sharalyn Ink in Treatment: 20 Education Assessment Education Provided To: Patient Education Topics Provided Wound/Skin Impairment: Handouts: Other: wound care as ordered Methods: Demonstration, Explain/Verbal Responses: State content correctly Electronic Signature(s) Signed: 11/15/2019 5:08:51 PM By: Montey Hora Entered By: Montey Hora on 11/15/2019 11:36:17 [name] ([id number]) -------------------------------------------------------------------------------- Wound Assessment Details Patient Name: [name] Date of Service: 11/15/2019 11:00 AM Medical Record Number: [id number] Patient Account Number: [id number] Date of Birth/Sex: 07-12-1930 (85 y.o. F) Treating RN: Harold Barban Primary Care Phallon Haydu: Fulton Reek Other Clinician: Referring Catia Todorov: Fulton Reek Treating Aul Mangieri/Extender: Melburn Hake, HOYT Weeks in Treatment: 20 Wound Status Wound Number: 11 Primary Skin Tear Etiology: Wound Location: Right Lower Leg - Anterior Wound Open Wounding Event: Trauma Status: Date Acquired: 06/27/2019 Comorbid Cataracts, Glaucoma, Optic Neuritis, Chronic Weeks Of Treatment: 20 History: sinus problems/congestion, Middle ear problems Clustered Wound: No Photos Wound Measurements Length: (cm) 1.2 Width: (cm) 0.4 Depth: (cm) 0.1 Area: (cm) 0.377 Volume: (cm) 0.038 % Reduction in Area: 96.5% % Reduction in Volume: 99.3% Epithelialization: None Tunneling: No Undermining: No Wound Description Full Thickness  Without Exposed Support Classification: Structures Wound Margin: Flat and Intact Exudate Medium Amount: Exudate Type: Serous Exudate Color: amber Foul Odor After Cleansing: No Slough/Fibrino Yes Wound Bed Granulation Amount: Large (67-100%) Exposed Structure Granulation Quality: Red Fascia Exposed: No Necrotic Amount: Small (1-33%) Fat Layer (Subcutaneous Tissue) Exposed: Yes Necrotic Quality: Adherent  Slough Tendon Exposed: No Muscle Exposed: No Joint Exposed: No Bone Exposed: No [name], [name] ([id number]) Treatment Notes Wound #11 (Right, Anterior Lower Leg) Notes hydrafera, BFD, patient's own compression Electronic Signature(s) Signed: 11/15/2019 4:16:49 PM By: Arnette NorrisBiell, Kristina Entered By: Arnette NorrisBiell, Kristina on 11/15/2019 11:17:11 [name], [name] ([id number]) -------------------------------------------------------------------------------- Vitals Details Patient Name: [name], Charo V. Date of Service: 11/15/2019 11:00 AM Medical Record Number: [id number] Patient Account Number: [id number] Date of Birth/Sex: 12/19/1929 (85 y.o. F) Treating RN: Curtis Sitesorthy, Joanna Primary Care Emilynn Srinivasan: Aram BeechamSparks, Jeffrey Other Clinician: Referring Donielle Kaigler: Aram BeechamSparks, Jeffrey Treating Sachin Ferencz/Extender: Linwood DibblesSTONE III, HOYT Weeks in Treatment: 20 Vital Signs Time Taken: 11:10 Temperature (F): 98.4 Height (in): 60 Pulse (bpm): 70 Weight (lbs): 125 Respiratory Rate (breaths/min): 16 Body Mass Index (BMI): 24.4 Blood Pressure (mmHg): 140/45 Reference Range: 80 - 120 mg / dl Electronic Signature(s) Signed: 11/15/2019 3:46:50 PM By: Dayton MartesWallace, RCP,RRT,CHT, Sallie RCP, RRT, CHT Entered By: Dayton MartesWallace, RCP,RRT,CHT, Sallie on 11/15/2019 11:11:15

## 2019-11-15 NOTE — Progress Notes (Addendum)
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 considerable undermining. Graphics PL #3 1 week follow-up. Graphix #4 09/12/18 graphics #5. Some improvement in wound area although the undermining superiorly still has not closed down as much as I would like 09/19/18; Graphix #6 I think there is improvement in the undermining from 7 to 9:00. Wound bed looks healthy. 09/26/18 Graffix #7 undermining is 0.5 cm maximally at roughly 8:00. From 12 to 7:00 the tissue is adherent which is a major improvement  there is some advancing skin from this side. 10/03/18; Graphix #8 no major changes from last week 10/10/18 Graffix #9 There are improvements. There appears to be granulation coming up to the surface here and there is a lot less undermining at 8:00. 10/17/18. Graffix #10; Dimensions are improved less undermining surface felt the but the wound is still open. Initially a surgical wound following a bursectomy 10/24/18; Graffix #11. This is really stalled over the last 2 weeks. If there is no further improvement this will be the last application.The final option for this difficult area would be plastic surgery and will set up a consult with Dr. Marina Goodell in [facility] 10/31/18; wound looks about the same. The undermining superiorly is 0.7 cm. On the lateral edges perhaps some improvement there is no drainage. 11-07-2018 patient seen today for follow-up and management of left elbow wound. She has completed a total of 11 treatments of the graffix with not much improvement. She has an upcoming appointment with plastic surgery to assist with additional treatment options for the left elbow wound on 11/19/18. There is significant amount surrounding undermining of the wound is 0.9 cm. Currently prescribed methotrexate. Wound is being treated with Indoform and border dressing. No drainage from wound. No fever, chills. or pain. 11/21/18; the patient continues to have the wound looking roughly the same with undermining from about 12 to 6:00. This has not changed all that much. She does have skin irritation around the wound that looks like drainage maceration issues. The patient states that she was not able to have her wound dressing changed because of illness in the person he usually does this. She also did not attend her clinic appointment today with Dr. Marina Goodell because of transportation issues. She is rebooked for some time in mid January 11/28/2018; the patient has less undermining using endoform. As a  understandings she saw Dr. Thad Ranger who is Dr. Lonni Fix partner. He recommended putting her in a elbow brace and I believe  is written a prescription for it. He also recommended Motrin 800 mg 3 times daily. This is prescription strength ibuprofen although he did not write his prescription. This apparently was for 2 weeks. Culture I did last time grew a few methicillin sensitive staph aureus. After some difficulty due to drug intolerances/allergies and drug interactions I settled on a 5-day course of azithromycin [name], [name] ([id number]) 12/03/18 on evaluation today patient actually appears to be doing fairly well in regard to her elbow when compared to last time I evaluated her. With that being said there does not appear to be any signs of infection at this time. That was the big concern currently as far as the patient was concerned. Nonetheless I do feel like she is making progress in regard to the feeling of this ulcer it has been slow. She did see a Engineer, petroleum they are talking about putting her in a brace in order to allow this area to heal more appropriately. 12/19/2018; not much change in this from the last time I have saw this.'s much smaller area than when she first came in and with less circumferential undermining however this is never really adhered. She is wearing the brace that was given or prescribed to her by plastics. She did not have a procedure offered to attempt to close this. We have been using endoform 1/15; wound actually is not doing as well as last week. She was actually not supposed to come into this clinic again until next week but apparently her attendant noticed some redness increasing pain and she came in early. She reports the same amount of drainage. We have been using endoform. She is approved through puraply however I will only consider starting that next week 1/22; she completed the antibiotics last week. Culture I did was negative. In spite of this there is  less erythema and pain complaints in the wound. Puraply #1 applied today 1/29; Puraply #2 today. Wound surface looks a lot better post debridement of adherent fibrinous material. However undermining from 6-12 is measuring worse 2/5; Puraply #3. Using her elbow brace 2/12 puraply #4 2/19 puraply #5. The 9:00 undermining measured at 0.5 cm. Undermining from 4-11 o'clock. Surface of the wound looks better and the circumference of the wound is smaller however the undermining is not really changed 2/26; still not much improvement. She has undermining from 4-9 o'clock 0.9 cm. Surface of the wound covered and adherent debris. 3/4; still no improvement. Undermining from 4-9 o'clock still around a centimeter. Surface of the wound looks somewhat better. No debridement is required we used endoform after we ended the trial of puraply last week 3/11; really no improvement at all. Still 1 cm undermining from roughly 9-3 o'clock. This is about a centimeter. The base of the wound looks fairly healthy. No debridement. We have been using endoform. I am really out of most usual options here. I could consider either another round of an amniotic advanced treatment product example epifix or perhaps regranex. Understandably the patient is a bit frustrated. We did send her to plastic surgery for a consult. Other than prescribing her a brace to immobilize the elbow they did not think she was a candidate for any further surgery. Notable that the patient is not using the brace today 3/18-Patient returns for attention to the left elbow area which apparently looked red at the home health visit. Patient's elbow looks the same if not better compared with last visit. The area of ulceration remains the same, the  base appears healthy. We are continuing to use endoform she has been encouraged to use the brace to keep the elbow straight 3/25; the patient has an appointment at the [facility] wound care center on 4/2. I had actually put  her out indefinitely however she seems to want to come back here every week. This week she complains of increased pain and malodor. Dimensions of the elbow wound are larger. We have been using endoform 4/8; the patient went to [facility] where they apparently gave her meta honey and some border foam with a Tubigrip. She has been using this for a week. She says she was very impressed with them there. They did not offer her any surgical consultation. She seems to be coming back here for a second opinion on this, she does not wish to drive to [facility] every week 1/61; still using Medihoney foam border and a Tubigrip. Actually do not think she has anything on the arm at all specifically she is not using her brace 5/6; she has been using medihoney without a lot of improvement. Undermining maximum at 9:00 at 0.5 cm may be somewhat better. She is still complaining of discomfort. She uses her elbow brace at night but is not using anything on the arm during the day, she finds it too restrictive 5/13; we switch the patient to endoform AG last week. She is complaining of more pain and worried about some circumferential erythema around the wound. I had planned to consider epifix in this wound however the patient came in with a request to see a plastic surgeon in [place] by the name of Dr. Wyline Mood who cared for a friend of hers. Noteworthy that I have already sent her to one plastic surgeon and she went for another second opinion at [facility] and they apparently did not send her to a plastic surgeon nevertheless the patient is fairly convinced that she might benefit from a skin graft which I am doubtful. She also has rheumatoid arthritis and is on methotrexate. This was originally a surgical wound for a bursectomy a year or 2 ago Readmission: 06/28/19 on evaluation today patient presents today for reevaluation but this is due to a new issue her elbow has completely healed and looks excellent. Her right anterior lower leg  has a skin tear which was sustained from her dog who jumped up on her and inadvertently scratch the area causing the skin tear. This happened yesterday. She is having some discomfort but fortunately nothing too significant which is good news. No fevers, chills, nausea, or vomiting noted at this time. The skin fortunately was knocked one completely off and we are gonna see about we approximate in the skin as best we can in using Steri-Strips to hold this in place obviously if we can get some of this to reattach that would be beneficial [name], [name]. ([id number]) 07/05/2019 on evaluation today patient actually appears to be doing okay although unfortunately the skin flap that we were attempting to Steri-Stripped down last week did not take. She is developing a lot of fluid underneath the wound area unfortunately which again is not ideal. I think that this necrotic tissue needs to be removed and again it actually just wiped off during the evaluation today as I was attempting to clean the wound there did not appear to be any significant issues underlying which is good although there was some purulent drainage I did want to go ahead and see about obtaining a culture from today in order to ensure  that the Z-Pak that I placed her on earlier in the week was appropriate for treating what ever infection may be causing the issue currently. This was a deep wound culture obtained today. 07/12/2019 on evaluation today patient actually appears to be doing quite well with regard to her right lower extremity ulcer all things considering. There is still little bit of hematoma not it that is noted in the central portion of the wound along with some necrotic tissue but she is still having quite a bit of discomfort. For that reason I did not perform sharp debridement today although this wound does need some debridement of one type or another. I think we may attempt Iodoflex to see if this can be of benefit. Fortunately  there is no signs of active infection at this point. 07/18/19 upon evaluation today patient appears to be doing better with regard to her ulcer on her right lower extremity. She's been tolerating the dressing changes without complication. The good news is she seems to be making excellent progress. Overall I'm pleased with there being no signs of infection. With that being said she does tell me that she's been having some discomfort with the wrap she's unsure of exactly what about the rafters causing her trouble. 07/25/2019 on evaluation today patient appears to be doing a little better in regard to her lower extremity ulcer. Unfortunately the alginate seems to be getting really stuck to the wound bed and surrounding periwound. Fortunately there is no signs of active infection at this time. There does not appear to be any evidence of infection currently. 08/01/2019 on evaluation today patient actually appears to be doing much better with regard to her right lower extremity ulcer. She has been tolerating the dressing changes without complication. Fortunately her wound does not show any signs of infection and seems to be making good progress. No fevers, chills, nausea, vomiting, or diarrhea. 08/08/2019 on evaluation today patient actually appears to be doing better with regard to her leg ulcer. She has been tolerating the dressing changes without complication. With that being said I think we may want to switch the dressing up today just based on the appearance of the wound bed in general. Fortunately there is no evidence of infection currently. Her pain seems to be much better. Her son is present with her today. 08/15/2019 on evaluation today patient appears to be doing very well with regard to her right lower extremity ulcer. She still complains about feeling like the compression stocking/Tubigrip is too tight for her. Nonetheless I still think this is beneficial and is helping the wound to heal more  effectively. Overall I am extremely pleased at this time with what I am seeing. 08/22/2019 on evaluation today patient actually appears to be doing quite well with regard to her right lower extremity. She has been tolerating the dressing changes without complication. Fortunately there is no signs of active infection at this time. She still complains about the Tubigrip but nonetheless I think this is something that is of utmost importance for her to continue to wear if she is going to see this area heal appropriately. 08/29/2019 on evaluation today patient actually appears to be doing well visually in regard to her wound. Unfortunately in regard to overall pain she seems to be having more pain today which I am somewhat concerned about. There does not appear to be any signs of active infection that I can tell but again with increased pain that is definitely a concern here today. I  do think it may be time to switch up the dressing as well to something that will be a little bit more effective hopefully and new tissue growth she has done well with collagen in the past. 09/05/2019 on evaluation today patient actually appears to be doing well with regard to her leg ulcer. She has been tolerating the dressing changes without complication. Fortunately there is no signs of active infection. We did obtain a wound culture last week due to the fact that she still is very touchy as far as the surface of the wound is concerned. Unfortunately I am not sure what happened to that culture as there is no record in the computer system. Nonetheless we need to follow-up on this and try to see if we can identify what exactly happened with this specimen as we have not gotten a result back either obviously. 09/12/2019 on evaluation today patient actually appears to be showing signs of good improvement upon evaluation today. She states her pain is not nearly as bad as what it was which is also good news. She did have a wound  culture which was reviewed today that showed no growth of bacteria which is good news. This coupled with the fact that her wound appears to be healing better, measuring smaller, and overall even the slough buildup is minimal compared to what we have seen previously, I feel like she is actually showing signs of excellent improvement today. [name], [name] ([id number]) 09/19/2019 on evaluation today patient appears to be doing well with regard to her wounds on evaluation today. She is showing signs of continued improvement week by week and everything is measuring significantly smaller which is great news. Overall very pleased with the progress that she has made. 09/26/2019 on evaluation today patient appears to be doing fairly well with regard to her right lower extremity ulcer. She does continue to have some drainage and again the smaller of the 2 wounds is not making as much progress as I like to see. We have been doing the silver collagen for some time I think we may want to switch to [facility] she has a little bit of hyper granular tissue this may help to keep things from becoming too dramatic in that regard. Fortunately there is no signs of infection at this time. 10/03/2019 on evaluation today patient appears to be doing fairly well with regard to her lower extremity ulcers at this time. She has been tolerating the dressing changes without complication. Fortunately the wound is measuring smaller which is great news she continues to make great progress. 10/17/2019 on evaluation today patient appears to be doing well with regard to her wounds of her right lower extremity. She states she has been having some increased pain apparently we gave her a little bit smaller size Tubigrip last time she was here this may have been what was causing some issues. She is out of the Tubigrip that we had ordered for her and therefore did not have any of the smaller size. Fortunately there is no evidence of  active infection at this time. No fever chills noted 10/24/2019 upon evaluation today patient's wounds actually appear to be showing signs of good improvement which is excellent news. She has been tolerating the dressing changes without complication and overall I am rather pleased at this point with how things seem to be progressing. The patient likewise is very happy. She still was not happy with having to use the Tubigrip she states that bunches up in  her shoe. 10/31/2019 on evaluation today patient actually appears to be doing quite well with regard to her leg ulcer. She has been tolerating the dressing changes without complication. Fortunately there is no signs of active infection at this time. Overall I am very pleased with the progress that she is made. She does wonder if we could switch to the bordered adhesive Hydrofera Blue I think that something we can definitely look into for her. 11/15/2019 patient on evaluation today appears to be making excellent progress. Her wound is significantly smaller and the wound bed appears to be dramatically improved compared to even the last visit. Fortunately she is having really little to no pain at this time. No fevers, chills, nausea, vomiting, or diarrhea. Patient History Information obtained from Patient. Family History Heart Disease - Siblings,Father, No family history of Cancer, Diabetes, Hereditary Spherocytosis, Hypertension, Kidney Disease, Lung Disease, Seizures, Stroke, Thyroid Problems, Tuberculosis. Social History Never smoker, Alcohol Use - Daily, Drug Use - No History, Caffeine Use - Never. Medical History Eyes Patient has history of Cataracts, Glaucoma, Optic Neuritis Ear/Nose/Mouth/Throat Patient has history of Chronic sinus problems/congestion, Middle ear problems Medical And Surgical History Notes Constitutional Symptoms (General Health) Back pain Ear/Nose/Mouth/Throat bilateral hearing aides Review of Systems  (ROS) Constitutional Symptoms (General Health) [name], [name] ([id number]) Denies complaints or symptoms of Fatigue, Fever, Chills, Marked Weight Change. Respiratory Denies complaints or symptoms of Chronic or frequent coughs, Shortness of Breath. Cardiovascular Denies complaints or symptoms of Chest pain, LE edema. Psychiatric Denies complaints or symptoms of Anxiety, Claustrophobia. Objective Constitutional Well-nourished and well-hydrated in no acute distress. Vitals Time Taken: 11:10 AM, Height: 60 in, Weight: 125 lbs, BMI: 24.4, Temperature: 98.4 F, Pulse: 70 bpm, Respiratory Rate: 16 breaths/min, Blood Pressure: 140/45 mmHg. Respiratory normal breathing without difficulty. clear to auscultation bilaterally. Cardiovascular regular rate and rhythm with normal S1, S2. Psychiatric this patient is able to make decisions and demonstrates good insight into disease process. Alert and Oriented x 3. pleasant and cooperative. General Notes: Patient's wound bed currently showed signs of good granulation at this time. Fortunately there was minimal slough noted I was able to mechanically clear this away with saline and gauze along with a Q-tip she tolerated this without complication no sharp debridement was necessary today. Integumentary (Hair, Skin) Wound #11 status is Open. Original cause of wound was Trauma. The wound is located on the Right,Anterior Lower Leg. The wound measures 1.2cm length x 0.4cm width x 0.1cm depth; 0.377cm^2 area and 0.038cm^3 volume. There is Fat Layer (Subcutaneous Tissue) Exposed exposed. There is no tunneling or undermining noted. There is a medium amount of serous drainage noted. The wound margin is flat and intact. There is large (67-100%) red granulation within the wound bed. There is a small (1-33%) amount of necrotic tissue within the wound bed including Adherent Slough. Assessment Active Problems ICD-10 Unspecified open wound, right lower leg, initial  encounter Non-pressure chronic ulcer of other part of right lower leg with fat layer exposed Struck by dog, initial encounter [name], [name] ([id number]) Plan Wound Cleansing: Wound #11 Right,Anterior Lower Leg: Clean wound with Normal Saline. Primary Wound Dressing: Wound #11 Right,Anterior Lower Leg: Hydrafera Blue Ready Transfer - bordered foam Dressing Change Frequency: Wound #11 Right,Anterior Lower Leg: Change dressing every other day. Follow-up Appointments: Return Appointment in 1 week. Edema Control: Wound #11 Right,Anterior Lower Leg: Patient to wear own compression stockings 1. My suggestion currently is good to be that we go ahead and initiate treatment with the continuation  of the Hydrofera Blue bordered foam dressing which seems to be doing well for her the patient is actually able to change this on her own which she also likes. 2. I would recommend as well she continue to use her own compression stockings which also seem to be beneficial for her. I am very pleased in that regard. 3. I am also can recommend she continue to elevate her legs as much as possible obviously the more of this she can do the better. We will see patient back for reevaluation in 1 week here in the clinic. If anything worsens or changes patient will contact our office for additional recommendations. Electronic Signature(s) Signed: 11/15/2019 1:07:23 PM By: Lenda Kelp PA-C Entered By: Lenda Kelp on 11/15/2019 13:07:23 [name] ([id number]) -------------------------------------------------------------------------------- ROS/PFSH Details Patient Name: [name] Date of Service: 11/15/2019 11:00 AM Medical Record Number: [id number] Patient Account Number: [id number] Date of Birth/Sex: Oct 09, 1930 (85 y.o. F) Treating RN: Curtis Sites Primary Care Provider: Aram Beecham Other Clinician: Referring Provider: Aram Beecham Treating Provider/Extender: Linwood Dibbles,  HOYT Weeks in Treatment: 20 Information Obtained From Patient Constitutional Symptoms (General Health) Complaints and Symptoms: Negative for: Fatigue; Fever; Chills; Marked Weight Change Medical History: Past Medical History Notes: Back pain Respiratory Complaints and Symptoms: Negative for: Chronic or frequent coughs; Shortness of Breath Cardiovascular Complaints and Symptoms: Negative for: Chest pain; LE edema Psychiatric Complaints and Symptoms: Negative for: Anxiety; Claustrophobia Eyes Medical History: Positive for: Cataracts; Glaucoma; Optic Neuritis Ear/Nose/Mouth/Throat Medical History: Positive for: Chronic sinus problems/congestion; Middle ear problems Past Medical History Notes: bilateral hearing aides HBO Extended History Items Ear/Nose/Mouth/Throat: Eyes: Eyes: Ear/Nose/Mouth/Throat: Chronic sinus Cataracts Glaucoma Middle ear problems problems/congestion Immunizations Pneumococcal Vaccine: Received Pneumococcal Vaccination: No Implantable Devices [name], [name] ([id number]) Yes Family and Social History Cancer: No; Diabetes: No; Heart Disease: Yes - Siblings,Father; Hereditary Spherocytosis: No; Hypertension: No; Kidney Disease: No; Lung Disease: No; Seizures: No; Stroke: No; Thyroid Problems: No; Tuberculosis: No; Never smoker; Alcohol Use: Daily; Drug Use: No History; Caffeine Use: Never; Financial Concerns: No; Food, Clothing or Shelter Needs: No; Support System Lacking: No; Transportation Concerns: No Physician Affirmation I have reviewed and agree with the above information. Electronic Signature(s) Signed: 11/15/2019 5:08:51 PM By: Curtis Sites Signed: 11/16/2019 11:37:39 PM By: Lenda Kelp PA-C Entered By: Lenda Kelp on 11/15/2019 13:06:39 [name] ([id number]) -------------------------------------------------------------------------------- SuperBill Details Patient Name: [name] Date of Service: 11/15/2019 Medical  Record Number: [id number] Patient Account Number: [id number] Date of Birth/Sex: February 08, 1930 (85 y.o. F) Treating RN: Curtis Sites Primary Care Provider: Aram Beecham Other Clinician: Referring Provider: Aram Beecham Treating Provider/Extender: Linwood Dibbles, HOYT Weeks in Treatment: 20 Diagnosis Coding ICD-10 Codes Code Description S81.801A Unspecified open wound, right lower leg, initial encounter L97.812 Non-pressure chronic ulcer of other part of right lower leg with fat layer exposed W54.1XXA Struck by dog, initial encounter Facility Procedures CPT4 Code: [id number] Description: [phone] - WOUND CARE VISIT-LEV 3 EST PT Modifier: Quantity: 1 Physician Procedures CPT4 Code Description: [id number] - WC PHYS LEVEL 4 - EST PT ICD-10 Diagnosis Description S81.801A Unspecified open wound, right lower leg, initial encounter L97.812 Non-pressure chronic ulcer of other part of right lower leg wi W54.1XXA Struck by  dog, initial encounter Modifier: th fat layer expo Quantity: 1 sed Electronic Signature(s) Signed: 11/15/2019 1:07:59 PM By: Lenda Kelp PA-C Entered By: Lenda Kelp on 11/15/2019 13:07:59

## 2019-11-25 ENCOUNTER — Other Ambulatory Visit: Payer: Self-pay

## 2019-11-25 ENCOUNTER — Encounter: Payer: Medicare Other | Admitting: Physician Assistant

## 2019-11-25 DIAGNOSIS — S81801A Unspecified open wound, right lower leg, initial encounter: Secondary | ICD-10-CM | POA: Diagnosis not present

## 2019-11-25 NOTE — Progress Notes (Addendum)
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 Iodoflex to try and clean out the wound bed. She has been approved for Graphix PL 08/15/18; 2 week follow-up. Difficult wound over her left olecranon process. Graphix PL #1 with collagen backing 08/22/18; one-week follow-up. Difficult wound over her left olecranon process. Graphix PL #2 08/29/18; no major improvement. Difficult wound over her left olecranon process. Still considerable undermining. Graphics PL #3 1 week follow-up. Graphix  #4 09/12/18 graphics #5. Some improvement in wound area although the undermining superiorly still has not closed down as much as I would like 09/19/18; Graphix #6 I think there is improvement in the undermining from 7 to 9:00. Wound bed looks healthy. 09/26/18 Graffix #7 undermining is 0.5 cm maximally at roughly 8:00. From 12 to 7:00 the tissue is adherent which is a major improvement there is some advancing skin from this side. 10/03/18; Graphix #8 no major changes from last week 10/10/18 Graffix #9 There are improvements. There appears to be granulation coming up to the surface here and there is a lot less undermining at 8:00. 10/17/18. Graffix #10; Dimensions are improved less undermining surface felt the but the wound is still open. Initially a surgical wound following a bursectomy 10/24/18; Graffix #11. This is really stalled over the last 2 weeks. If there is no further improvement this will be the last application.The final option for this difficult area would be plastic surgery and will set up a consult with Dr. Marina Goodell in [facility] 10/31/18; wound looks about the same. The undermining superiorly is 0.7 cm. On the lateral edges perhaps some improvement there is no drainage. 11-07-2018 patient seen today for follow-up and management of left elbow wound. She has completed a total of 11 treatments of the graffix with not much improvement. She has an upcoming appointment with plastic surgery to assist with additional treatment options for the left elbow wound on 11/19/18. There is significant amount surrounding undermining of the wound is 0.9 cm. Currently prescribed methotrexate. Wound is being treated with Indoform and border dressing. No drainage from wound. No fever, chills. or pain. 11/21/18; the patient continues to have the wound looking roughly the same with undermining from about 12 to 6:00. This has not changed all that much. She does have skin irritation around the wound that  looks like drainage maceration issues. The patient states  that she was not able to have her wound dressing changed because of illness in the person he usually does this. She also did not attend her clinic appointment today with Dr. Lorayne Bender because of transportation issues. She is rebooked for some time in mid January 11/28/2018; the patient has less undermining using endoform. As a understandings she saw Dr. Doy Mince who is Dr. Rebekah Chesterfield partner. He recommended putting her in a elbow brace and I believe is written a prescription for it. He also recommended Motrin 800 mg 3 times daily. This is prescription strength ibuprofen although he did not write his prescription. This apparently was for 2 weeks. Culture I did last time grew a few methicillin sensitive staph aureus. After some difficulty due to drug intolerances/allergies and drug interactions I settled on a 5-day course of azithromycin EUGENIE, HAREWOOD ([id number]) 12/03/18 on evaluation today patient actually appears to be doing fairly well in regard to her elbow when compared to last time I evaluated her. With that being said there does not appear to be any signs of infection at this time. That was the big concern currently as far as the patient was concerned. Nonetheless I do feel like she is making progress in regard to the feeling of this ulcer it has been slow. She did see a Psychiatric nurse they are talking about putting her in a brace in order to allow this area to heal more appropriately. 12/19/2018; not much change in this from the last time I have saw this.'s much smaller area than when she first came in and with less circumferential undermining however this is never really adhered. She is wearing the brace that was given or prescribed to her by plastics. She did not have a procedure offered to attempt to close this. We have been using endoform 1/15; wound actually is not doing as well as last week. She was actually not supposed to  come into this clinic again until next week but apparently her attendant noticed some redness increasing pain and she came in early. She reports the same amount of drainage. We have been using endoform. She is approved through puraply however I will only consider starting that next week 1/22; she completed the antibiotics last week. Culture I did was negative. In spite of this there is less erythema and pain complaints in the wound. Puraply #1 applied today 1/29; Puraply #2 today. Wound surface looks a lot better post debridement of adherent fibrinous material. However undermining from 6-12 is measuring worse 2/5; Puraply #3. Using her elbow brace 2/12 puraply #4 2/19 puraply #5. The 9:00 undermining measured at 0.5 cm. Undermining from 4-11 o'clock. Surface of the wound looks better and the circumference of the wound is smaller however the undermining is not really changed 2/26; still not much improvement. She has undermining from 4-9 o'clock 0.9 cm. Surface of the wound covered and adherent debris. 3/4; still no improvement. Undermining from 4-9 o'clock still around a centimeter. Surface of the wound looks somewhat better. No debridement is required we used endoform after we ended the trial of puraply last week 3/11; really no improvement at all. Still 1 cm undermining from roughly 9-3 o'clock. This is about a centimeter. The base of the wound looks fairly healthy. No debridement. We have been using endoform. I am really out of most usual options here. I could consider either another round of an amniotic advanced treatment product example epifix or perhaps regranex. Understandably the patient is a bit frustrated. We did send  her to plastic surgery for a consult. Other than prescribing her a brace to immobilize the elbow they did not think she was a candidate for any further surgery. Notable that the patient is not using the brace today 3/18-Patient returns for attention to the left elbow area  which apparently looked red at the home health visit. Patient's elbow looks the same if not better compared with last visit. The area of ulceration remains the same, the base appears healthy. We are continuing to use endoform she has been encouraged to use the brace to keep the elbow straight 3/25; the patient has an appointment at the [facility] wound care center on 4/2. I had actually put her out indefinitely however she seems to want to come back here every week. This week she complains of increased pain and malodor. Dimensions of the elbow wound are larger. We have been using endoform 4/8; the patient went to [facility] where they apparently gave her meta honey and some border foam with a Tubigrip. She has been using this for a week. She says she was very impressed with them there. They did not offer her any surgical consultation. She seems to be coming back here for a second opinion on this, she does not wish to drive to [facility] every week 1/61; still using Medihoney foam border and a Tubigrip. Actually do not think she has anything on the arm at all specifically she is not using her brace 5/6; she has been using medihoney without a lot of improvement. Undermining maximum at 9:00 at 0.5 cm may be somewhat better. She is still complaining of discomfort. She uses her elbow brace at night but is not using anything on the arm during the day, she finds it too restrictive 5/13; we switch the patient to endoform AG last week. She is complaining of more pain and worried about some circumferential erythema around the wound. I had planned to consider epifix in this wound however the patient came in with a request to see a plastic surgeon in [place] by the name of Dr. Wyline Mood who cared for a friend of hers. Noteworthy that I have already sent her to one plastic surgeon and she went for another second opinion at [facility] and they apparently did not send her to a plastic surgeon nevertheless the patient is fairly  convinced that she might benefit from a skin graft which I am doubtful. She also has rheumatoid arthritis and is on methotrexate. This was originally a surgical wound for a bursectomy a year or 2 ago Readmission: 06/28/19 on evaluation today patient presents today for reevaluation but this is due to a new issue her elbow has completely healed and looks excellent. Her right anterior lower leg has a skin tear which was sustained from her dog who jumped up on her and inadvertently scratch the area causing the skin tear. This happened yesterday. She is having some discomfort but fortunately nothing too significant which is good news. No fevers, chills, nausea, or vomiting noted at this time. The skin fortunately was knocked one completely off and we are gonna see about we approximate in the skin as best we can in using Steri-Strips to hold this in place obviously if we can get some of this to reattach that would be beneficial NADINA, FOMBY. ([id number]) 07/05/2019 on evaluation today patient actually appears to be doing okay although unfortunately the skin flap that we were attempting to Steri-Stripped down last week did not take. She is developing a lot of  fluid underneath the wound area unfortunately which again is not ideal. I think that this necrotic tissue needs to be removed and again it actually just wiped off during the evaluation today as I was attempting to clean the wound there did not appear to be any significant issues underlying which is good although there was some purulent drainage I did want to go ahead and see about obtaining a culture from today in order to ensure that the Z-Pak that I placed her on earlier in the week was appropriate for treating what ever infection may be causing the issue currently. This was a deep wound culture obtained today. 07/12/2019 on evaluation today patient actually appears to be doing quite well with regard to her right lower extremity ulcer all things  considering. There is still little bit of hematoma not it that is noted in the central portion of the wound along with some necrotic tissue but she is still having quite a bit of discomfort. For that reason I did not perform sharp debridement today although this wound does need some debridement of one type or another. I think we may attempt Iodoflex to see if this can be of benefit. Fortunately there is no signs of active infection at this point. 07/18/19 upon evaluation today patient appears to be doing better with regard to her ulcer on her right lower extremity. She's been tolerating the dressing changes without complication. The good news is she seems to be making excellent progress. Overall I'm pleased with there being no signs of infection. With that being said she does tell me that she's been having some discomfort with the wrap she's unsure of exactly what about the rafters causing her trouble. 07/25/2019 on evaluation today patient appears to be doing a little better in regard to her lower extremity ulcer. Unfortunately the alginate seems to be getting really stuck to the wound bed and surrounding periwound. Fortunately there is no signs of active infection at this time. There does not appear to be any evidence of infection currently. 08/01/2019 on evaluation today patient actually appears to be doing much better with regard to her right lower extremity ulcer. She has been tolerating the dressing changes without complication. Fortunately her wound does not show any signs of infection and seems to be making good progress. No fevers, chills, nausea, vomiting, or diarrhea. 08/08/2019 on evaluation today patient actually appears to be doing better with regard to her leg ulcer. She has been tolerating the dressing changes without complication. With that being said I think we may want to switch the dressing up today just based on the appearance of the wound bed in general. Fortunately there is no  evidence of infection currently. Her pain seems to be much better. Her son is present with her today. 08/15/2019 on evaluation today patient appears to be doing very well with regard to her right lower extremity ulcer. She still complains about feeling like the compression stocking/Tubigrip is too tight for her. Nonetheless I still think this is beneficial and is helping the wound to heal more effectively. Overall I am extremely pleased at this time with what I am seeing. 08/22/2019 on evaluation today patient actually appears to be doing quite well with regard to her right lower extremity. She has been tolerating the dressing changes without complication. Fortunately there is no signs of active infection at this time. She still complains about the Tubigrip but nonetheless I think this is something that is of utmost importance for her to continue  to wear if she is going to see this area heal appropriately. 08/29/2019 on evaluation today patient actually appears to be doing well visually in regard to her wound. Unfortunately in regard to overall pain she seems to be having more pain today which I am somewhat concerned about. There does not appear to be any signs of active infection that I can tell but again with increased pain that is definitely a concern here today. I do think it may be time to switch up the dressing as well to something that will be a little bit more effective hopefully and new tissue growth she has done well with collagen in the past. 09/05/2019 on evaluation today patient actually appears to be doing well with regard to her leg ulcer. She has been tolerating the dressing changes without complication. Fortunately there is no signs of active infection. We did obtain a wound culture last week due to the fact that she still is very touchy as far as the surface of the wound is concerned. Unfortunately I am not sure what happened to that culture as there is no record in the computer system.  Nonetheless we need to follow-up on this and try to see if we can identify what exactly happened with this specimen as we have not gotten a result back either obviously. 09/12/2019 on evaluation today patient actually appears to be showing signs of good improvement upon evaluation today. She states her pain is not nearly as bad as what it was which is also good news. She did have a wound culture which was reviewed today that showed no growth of bacteria which is good news. This coupled with the fact that her wound appears to be healing better, measuring smaller, and overall even the slough buildup is minimal compared to what we have seen previously, I feel like she is actually showing signs of excellent improvement today. MARNESHA, GAGEN ([id number]) 09/19/2019 on evaluation today patient appears to be doing well with regard to her wounds on evaluation today. She is showing signs of continued improvement week by week and everything is measuring significantly smaller which is great news. Overall very pleased with the progress that she has made. 09/26/2019 on evaluation today patient appears to be doing fairly well with regard to her right lower extremity ulcer. She does continue to have some drainage and again the smaller of the 2 wounds is not making as much progress as I like to see. We have been doing the silver collagen for some time I think we may want to switch to [facility] she has a little bit of hyper granular tissue this may help to keep things from becoming too dramatic in that regard. Fortunately there is no signs of infection at this time. 10/03/2019 on evaluation today patient appears to be doing fairly well with regard to her lower extremity ulcers at this time. She has been tolerating the dressing changes without complication. Fortunately the wound is measuring smaller which is great news she continues to make great progress. 10/17/2019 on evaluation today patient appears to be  doing well with regard to her wounds of her right lower extremity. She states she has been having some increased pain apparently we gave her a little bit smaller size Tubigrip last time she was here this may have been what was causing some issues. She is out of the Tubigrip that we had ordered for her and therefore did not have any of the smaller size. Fortunately there is no evidence  of active infection at this time. No fever chills noted 10/24/2019 upon evaluation today patient's wounds actually appear to be showing signs of good improvement which is excellent news. She has been tolerating the dressing changes without complication and overall I am rather pleased at this point with how things seem to be progressing. The patient likewise is very happy. She still was not happy with having to use the Tubigrip she states that bunches up in her shoe. 10/31/2019 on evaluation today patient actually appears to be doing quite well with regard to her leg ulcer. She has been tolerating the dressing changes without complication. Fortunately there is no signs of active infection at this time. Overall I am very pleased with the progress that she is made. She does wonder if we could switch to the bordered adhesive Hydrofera Blue I think that something we can definitely look into for her. 11/15/2019 patient on evaluation today appears to be making excellent progress. Her wound is significantly smaller and the wound bed appears to be dramatically improved compared to even the last visit. Fortunately she is having really little to no pain at this time. No fevers, chills, nausea, vomiting, or diarrhea. 11/25/2019 on evaluation today patient appears to be doing well with regard to her lower extremity ulcer. She has been tolerating dressing changes without complication. Fortunately there is no signs of active infection at this time. No fevers, chills, nausea, vomiting, or diarrhea. Patient History Information  obtained from Patient. Family History Heart Disease - Siblings,Father, No family history of Cancer, Diabetes, Hereditary Spherocytosis, Hypertension, Kidney Disease, Lung Disease, Seizures, Stroke, Thyroid Problems, Tuberculosis. Social History Never smoker, Alcohol Use - Daily, Drug Use - No History, Caffeine Use - Never. Medical History Eyes Patient has history of Cataracts, Glaucoma, Optic Neuritis Ear/Nose/Mouth/Throat Patient has history of Chronic sinus problems/congestion, Middle ear problems Medical And Surgical History Notes Constitutional Symptoms (General Health) Back pain Ear/Nose/Mouth/Throat MINIE, ROADCAP ([id number]) bilateral hearing aides Review of Systems (ROS) Constitutional Symptoms (General Health) Denies complaints or symptoms of Fatigue, Fever, Chills, Marked Weight Change. Respiratory Denies complaints or symptoms of Chronic or frequent coughs, Shortness of Breath. Cardiovascular Denies complaints or symptoms of Chest pain, LE edema. Psychiatric Denies complaints or symptoms of Anxiety, Claustrophobia. Objective Constitutional Well-nourished and well-hydrated in no acute distress. Vitals Time Taken: 1:40 PM, Height: 60 in, Weight: 125 lbs, BMI: 24.4, Temperature: 98.4 F, Pulse: 69 bpm, Respiratory Rate: 16 breaths/min, Blood Pressure: 154/55 mmHg. Respiratory normal breathing without difficulty. clear to auscultation bilaterally. Cardiovascular regular rate and rhythm with normal S1, S2. Psychiatric this patient is able to make decisions and demonstrates good insight into disease process. Alert and Oriented x 3. pleasant and cooperative. General Notes: Patient's wound bed currently showed signs of excellent granulation at this time. Fortunately there is no evidence of active infection and overall I feel like she is healing quite nicely. Apparently the dressing was very stuck to the wound bed unfortunately. For that reason I would recommend that we  use some Mepitel in between the wound and the [facility] order to prevent this from sticking to the wound bed. Integumentary (Hair, Skin) Wound #11 status is Open. Original cause of wound was Trauma. The wound is located on the Right,Anterior Lower Leg. The wound measures 0.6cm length x 0.3cm width x 0.1cm depth; 0.141cm^2 area and 0.014cm^3 volume. Assessment Active Problems ICD-10 Unspecified open wound, right lower leg, initial encounter DAZIYAH, COGAN ([id number]) Non-pressure chronic ulcer of other part of right lower leg  with fat layer exposed Struck by dog, initial encounter Plan Wound Cleansing: Wound #11 Right,Anterior Lower Leg: Clean wound with Normal Saline. Primary Wound Dressing: Wound #11 Right,Anterior Lower Leg: Hydrafera Blue Ready Transfer - bordered foam Other: - Mepitel to wound bed prior to Hblue Dressing Change Frequency: Wound #11 Right,Anterior Lower Leg: Change dressing every other day. Follow-up Appointments: Return Appointment in 1 week. Edema Control: Wound #11 Right,Anterior Lower Leg: Patient to wear own compression stockings 1. I would recommend currently that we continue with the [facility] bordered foam dressing that she is using at home. Underneath this we will use Mepitel to prevent this from sticking to the wound bed is apparently it was very stuck this week. 2. I would recommend as well that we continue to have her use the compression stockings she has been using her own and that seems to be doing well. 3. With regard to the wound itself I feel like this is almost completely healed I do not see any evidence of active infection and overall I am very pleased and hopeful that she will be completely healed come next week. We will see patient back for reevaluation in 1 week here in the clinic. If anything worsens or changes patient will contact our office for additional recommendations. Electronic Signature(s) Signed: 11/25/2019 2:56:31  PM By: Lenda Kelp PA-C Entered By: Lenda Kelp on 11/25/2019 14:56:31 [name] ([id number]) -------------------------------------------------------------------------------- ROS/PFSH Details Patient Name: [name] Date of Service: 11/25/2019 1:45 PM Medical Record Number: [id number] Patient Account Number: [id number] Date of Birth/Sex: [birth year] (85 y.o. F) Treating RN: Arnette Norris Primary Care Provider: Aram Beecham Other Clinician: Referring Provider: Aram Beecham Treating Provider/Extender: Linwood Dibbles, Cynthia Stainback Weeks in Treatment: 21 Information Obtained From Patient Constitutional Symptoms (General Health) Complaints and Symptoms: Negative for: Fatigue; Fever; Chills; Marked Weight Change Medical History: Past Medical History Notes: Back pain Respiratory Complaints and Symptoms: Negative for: Chronic or frequent coughs; Shortness of Breath Cardiovascular Complaints and Symptoms: Negative for: Chest pain; LE edema Psychiatric Complaints and Symptoms: Negative for: Anxiety; Claustrophobia Eyes Medical History: Positive for: Cataracts; Glaucoma; Optic Neuritis Ear/Nose/Mouth/Throat Medical History: Positive for: Chronic sinus problems/congestion; Middle ear problems Past Medical History Notes: bilateral hearing aides HBO Extended History Items Ear/Nose/Mouth/Throat: Eyes: Eyes: Ear/Nose/Mouth/Throat: Chronic sinus Cataracts Glaucoma Middle ear problems problems/congestion Immunizations Pneumococcal Vaccine: Received Pneumococcal Vaccination: No Implantable Devices SEMIYAH, NEWGENT ([id number]) Yes Family and Social History Cancer: No; Diabetes: No; Heart Disease: Yes - Siblings,Father; Hereditary Spherocytosis: No; Hypertension: No; Kidney Disease: No; Lung Disease: No; Seizures: No; Stroke: No; Thyroid Problems: No; Tuberculosis: No; Never smoker; Alcohol Use: Daily; Drug Use: No History; Caffeine Use: Never; Financial Concerns: No;  Food, Clothing or Shelter Needs: No; Support System Lacking: No; Transportation Concerns: No Physician Affirmation I have reviewed and agree with the above information. Electronic Signature(s) Signed: 11/25/2019 4:05:06 PM By: Arnette Norris Signed: 11/25/2019 5:30:09 PM By: Lenda Kelp PA-C Entered By: Lenda Kelp on 11/25/2019 14:55:22 [name] ([id number]) -------------------------------------------------------------------------------- SuperBill Details Patient Name: [name] Date of Service: 11/25/2019 Medical Record Number: [id number] Patient Account Number: [id number] Date of Birth/Sex: Mar 08, 1930 (85 y.o. F) Treating RN: Arnette Norris Primary Care Provider: Aram Beecham Other Clinician: Referring Provider: Aram Beecham Treating Provider/Extender: Linwood Dibbles, Zaxton Angerer Weeks in Treatment: 21 Diagnosis Coding ICD-10 Codes Code Description S81.801A Unspecified open wound, right lower leg, initial encounter L97.812 Non-pressure chronic ulcer of other part of right lower leg with fat layer exposed W54.1XXA Struck by dog,  initial encounter Facility Procedures CPT4 Code: [id number] Description: [phone] - WOUND CARE VISIT-LEV 2 EST PT Modifier: Quantity: 1 Physician Procedures CPT4 Code Description: [id number] - WC PHYS LEVEL 4 - EST PT ICD-10 Diagnosis Description S81.801A Unspecified open wound, right lower leg, initial encounter L97.812 Non-pressure chronic ulcer of other part of right lower leg wi W54.1XXA Struck by  dog, initial encounter Modifier: th fat layer expo Quantity: 1 sed Electronic Signature(s) Signed: 11/25/2019 2:56:44 PM By: Lenda Kelp PA-C Entered By: Lenda Kelp on 11/25/2019 14:56:43

## 2019-11-28 NOTE — Progress Notes (Signed)
[name], [name] ([id number]) Visit Report for 11/25/2019 Arrival Information Details Patient Name: [name], [name] Date of Service: 11/25/2019 1:45 PM Medical Record Number: [id number] Patient Account Number: [id number] Date of Birth/Sex: 1930/04/17 (85 y.o. F) Treating RN: Huel Coventry Primary Care Mindel Friscia: Aram Beecham Other Clinician: Referring Ruby Dilone: Aram Beecham Treating Damarius Karnes/Extender: Linwood Dibbles, HOYT Weeks in Treatment: 21 Visit Information History Since Last Visit Added or deleted any medications: No Patient Arrived: Dan Humphreys Any new allergies or adverse reactions: No Arrival Time: 13:38 Had a fall or experienced change in No Accompanied By: son in activities of daily living that may affect lobby risk of falls: Transfer Assistance: None Signs or symptoms of abuse/neglect since last visito No Patient Identification Verified: Yes Hospitalized since last visit: No Secondary Verification Process Completed: Yes Implantable device outside of the clinic excluding No Patient Requires Transmission-Based No cellular tissue based products placed in the center Precautions: since last visit: Patient Has Alerts: No Pain Present Now: No Electronic Signature(s) Signed: 11/28/2019 11:42:17 AM By: Elliot Gurney, BSN, RN, CWS, Kim RN, BSN Entered By: Elliot Gurney, BSN, RN, CWS, Kim on 11/25/2019 13:39:01 [name] ([id number]) -------------------------------------------------------------------------------- Clinic Level of Care Assessment Details Patient Name: [name] Date of Service: 11/25/2019 1:45 PM Medical Record Number: [id number] Patient Account Number: [id number] Date of Birth/Sex: 1930/12/11 (85 y.o. F) Treating RN: Arnette Norris Primary Care Kadarrius Yanke: Aram Beecham Other Clinician: Referring Gearl Baratta: Aram Beecham Treating Jamason Peckham/Extender: Linwood Dibbles, HOYT Weeks in Treatment: 21 Clinic Level of Care Assessment Items TOOL 4 Quantity Score []  - Use when  only an EandM is performed on FOLLOW-UP visit 0 ASSESSMENTS - Nursing Assessment / Reassessment X - Reassessment of Co-morbidities (includes updates in patient status) 1 10 X- 1 5 Reassessment of Adherence to Treatment Plan ASSESSMENTS - Wound and Skin Assessment / Reassessment X - Simple Wound Assessment / Reassessment - one wound 1 5 []  - 0 Complex Wound Assessment / Reassessment - multiple wounds []  - 0 Dermatologic / Skin Assessment (not related to wound area) ASSESSMENTS - Focused Assessment []  - Circumferential Edema Measurements - multi extremities 0 []  - 0 Nutritional Assessment / Counseling / Intervention []  - 0 Lower Extremity Assessment (monofilament, tuning fork, pulses) []  - 0 Peripheral Arterial Disease Assessment (using hand held doppler) ASSESSMENTS - Ostomy and/or Continence Assessment and Care []  - Incontinence Assessment and Management 0 []  - 0 Ostomy Care Assessment and Management (repouching, etc.) PROCESS - Coordination of Care X - Simple Patient / Family Education for ongoing care 1 15 []  - 0 Complex (extensive) Patient / Family Education for ongoing care []  - 0 Staff obtains , Records, Test Results / Process Orders []  - 0 Staff telephones HHA, Nursing Homes / Clarify orders / etc []  - 0 Routine Transfer to another Facility (non-emergent condition) []  - 0 Routine Hospital Admission (non-emergent condition) []  - 0 New Admissions / / Ordering NPWT, Apligraf, etc. []  - 0 Emergency Hospital Admission (emergent condition) X- 1 10 Simple Discharge Coordination [name], [name] ( ) []  - 0 Complex (extensive) Discharge Coordination PROCESS - Special Needs []  - Pediatric / Minor Patient Management 0 []  - 0 Isolation Patient Management []  - 0 Hearing / Language / Visual special needs []  - 0 Assessment of Community assistance (transportation, D/C planning, etc.) []  - 0 Additional assistance / Altered  mentation []  - 0 Support Surface(s) Assessment (bed, cushion, seat, etc.) INTERVENTIONS - Wound Cleansing / Measurement X - Simple Wound Cleansing - one wound 1 5 []  -  0 Complex Wound Cleansing - multiple wounds X- 1 5 Wound Imaging (photographs - any number of wounds) []  - 0 Wound Tracing (instead of photographs) X- 1 5 Simple Wound Measurement - one wound []  - 0 Complex Wound Measurement - multiple wounds INTERVENTIONS - Wound Dressings X - Small Wound Dressing one or multiple wounds 1 10 []  - 0 Medium Wound Dressing one or multiple wounds []  - 0 Large Wound Dressing one or multiple wounds []  - 0 Application of Medications - topical []  - 0 Application of Medications - injection INTERVENTIONS - Miscellaneous []  - External ear exam 0 []  - 0 Specimen Collection (cultures, biopsies, blood, body fluids, etc.) []  - 0 Specimen(s) / Culture(s) sent or taken to Lab for analysis []  - 0 Patient Transfer (multiple staff / Civil Service fast streamer / Similar devices) []  - 0 Simple Staple / Suture removal (25 or less) []  - 0 Complex Staple / Suture removal (26 or more) []  - 0 Hypo / Hyperglycemic Management (close monitor of Blood Glucose) []  - 0 Ankle / Brachial Index (ABI) - do not check if billed separately X- 1 5 Vital Signs [name], [name] ([id number]) Has the patient been seen at the hospital within the last three years: Yes Total Score: 75 Level Of Care: New/Established - Level 2 Electronic Signature(s) Signed: 11/25/2019 4:05:06 PM By: Harold Barban Entered By: Harold Barban on 11/25/2019 14:47:41 [name] ([id number]) -------------------------------------------------------------------------------- Encounter Discharge Information Details Patient Name: [name] Date of Service: 11/25/2019 1:45 PM Medical Record Number: [id number] Patient Account Number: [id number] Date of Birth/Sex: 29-May-1930 (85 y.o. F) Treating RN: Harold Barban Primary Care [name] [place]:  Fulton Reek Other Clinician: Referring Ladale Sherburn: Fulton Reek Treating Braydin Aloi/Extender: Melburn Hake, HOYT Weeks in Treatment: 21 Encounter Discharge Information Items Discharge Condition: Stable Ambulatory Status: Ambulatory Discharge Destination: Home Transportation: Private Auto Accompanied By: self Schedule Follow-up Appointment: Yes Clinical Summary of Care: Electronic Signature(s) Signed: 11/25/2019 4:05:06 PM By: Harold Barban Entered By: Harold Barban on 11/25/2019 14:52:18 [name] ([id number]) -------------------------------------------------------------------------------- Lower Extremity Assessment Details Patient Name: [name] Date of Service: 11/25/2019 1:45 PM Medical Record Number: [id number] Patient Account Number: [id number] Date of Birth/Sex: 1930/03/31 (85 y.o. F) Treating RN: Cornell Barman Primary Care Shonette Rhames: Fulton Reek Other Clinician: Referring Luismario Coston: Fulton Reek Treating Ahmon Tosi/Extender: Melburn Hake, HOYT Weeks in Treatment: 21 Electronic Signature(s) Signed: 11/28/2019 11:42:17 AM By: Gretta Cool, BSN, RN, CWS, Kim RN, BSN Entered By: Gretta Cool, BSN, RN, CWS, Kim on 11/25/2019 13:47:27 [name] ([id number]) -------------------------------------------------------------------------------- Multi Wound Chart Details Patient Name: [name] Date of Service: 11/25/2019 1:45 PM Medical Record Number: [id number] Patient Account Number: [id number] Date of Birth/Sex: 03-22-1930 (85 y.o. F) Treating RN: Harold Barban Primary Care Makynli Stills: Fulton Reek Other Clinician: Referring Ahjanae Cassel: Fulton Reek Treating Lillyrose Reitan/Extender: Melburn Hake, HOYT Weeks in Treatment: 21 Vital Signs Height(in): 60 Pulse(bpm): 61 Weight(lbs): 125 Blood Pressure(mmHg): 154/55 Body Mass Index(BMI): 24 Temperature(F): 98.4 Respiratory Rate 16 (breaths/min): Photos: [11:No Photos] [N/A:N/A] Wound Location: [11:Right, Anterior  Lower Leg] [N/A:N/A] Wounding Event: [11:Trauma] [N/A:N/A] Primary Etiology: [11:Skin Tear] [N/A:N/A] Date Acquired: [11:06/27/2019] [N/A:N/A] Weeks of Treatment: [11:21] [N/A:N/A] Wound Status: [11:Open] [N/A:N/A] Measurements L x W x D [11:0.6x0.3x0.1] [N/A:N/A] (cm) Area (cm) : [11:0.141] [N/A:N/A] Volume (cm) : [11:0.014] [N/A:N/A] % Reduction in Area: [11:98.70%] [N/A:N/A] % Reduction in Volume: [11:99.70%] [N/A:N/A] Classification: [11:Full Thickness Without Exposed Support Structures] [N/A:N/A] Treatment Notes Electronic Signature(s) Signed: 11/25/2019 4:05:06 PM By: Harold Barban Entered By: Harold Barban on 11/25/2019 14:46:26 [name] ([id number]) --------------------------------------------------------------------------------  Multi-Disciplinary Care Plan Details Patient Name: [name], [name]. Date of Service: 11/25/2019 1:45 PM Medical Record Number: [id number] Patient Account Number: [id number] Date of Birth/Sex: 1929-12-15 (85 y.o. F) Treating RN: Arnette Norris Primary Care Aroush Chasse: Aram Beecham Other Clinician: Referring Crimson Dubberly: Aram Beecham Treating Nikeisha Klutz/Extender: Linwood Dibbles, HOYT Weeks in Treatment: 21 Active Inactive Abuse / Safety / Falls / Self Care Management Nursing Diagnoses: Potential for falls Goals: Patient will remain injury free related to falls Date Initiated: 06/28/2019 Target Resolution Date: 09/21/2019 Goal Status: Active Interventions: Assess fall risk on admission and as needed Notes: Wound/Skin Impairment Nursing Diagnoses: Impaired tissue integrity Goals: Ulcer/skin breakdown will heal within 14 weeks Date Initiated: 06/28/2019 Target Resolution Date: 09/21/2019 Goal Status: Active Interventions: Assess patient/caregiver ability to obtain necessary supplies Assess patient/caregiver ability to perform ulcer/skin care regimen upon admission and as needed Assess ulceration(s) every visit Notes: Electronic  Signature(s) Signed: 11/25/2019 4:05:06 PM By: Arnette Norris Entered By: Arnette Norris on 11/25/2019 14:46:03 [name] ([id number]) -------------------------------------------------------------------------------- Pain Assessment Details Patient Name: [name] Date of Service: 11/25/2019 1:45 PM Medical Record Number: [id number] Patient Account Number: [id number] Date of Birth/Sex: July 29, 1930 (85 y.o. F) Treating RN: Huel Coventry Primary Care Kadance Mccuistion: Aram Beecham Other Clinician: Referring Tameka Hoiland: Aram Beecham Treating Mihailo Sage/Extender: Linwood Dibbles, HOYT Weeks in Treatment: 21 Active Problems Location of Pain Severity and Description of Pain Patient Has Paino No Site Locations Pain Management and Medication Current Pain Management: Notes Patient denies pain at this time. Electronic Signature(s) Signed: 11/28/2019 11:42:17 AM By: Elliot Gurney, BSN, RN, CWS, Kim RN, BSN Entered By: Elliot Gurney, BSN, RN, CWS, Kim on 11/25/2019 13:39:18 [name] ([id number]) -------------------------------------------------------------------------------- Patient/Caregiver Education Details Patient Name: [name] Date of Service: 11/25/2019 1:45 PM Medical Record Number: [id number] Patient Account Number: [id number] Date of Birth/Gender: 1930-03-09 (85 y.o. F) Treating RN: Arnette Norris Primary Care Physician: Aram Beecham Other Clinician: Referring Physician: Aram Beecham Treating Physician/Extender: Skeet Simmer in Treatment: 21 Education Assessment Education Provided To: Patient Education Topics Provided Wound/Skin Impairment: Handouts: Caring for Your Ulcer Methods: Demonstration, Explain/Verbal Responses: State content correctly Electronic Signature(s) Signed: 11/25/2019 4:05:06 PM By: Arnette Norris Entered By: Arnette Norris on 11/25/2019 14:46:43 [name]  ([id number]) -------------------------------------------------------------------------------- Wound Assessment Details Patient Name: [name] Date of Service: 11/25/2019 1:45 PM Medical Record Number: [id number] Patient Account Number: [id number] Date of Birth/Sex: 01-Feb-1930 (85 y.o. F) Treating RN: Huel Coventry Primary Care Megyn Leng: Aram Beecham Other Clinician: Referring Renetta Suman: Aram Beecham Treating Trinitey Roache/Extender: Linwood Dibbles, HOYT Weeks in Treatment: 21 Wound Status Wound Number: 11 Primary Etiology: Skin Tear Wound Location: Right, Anterior Lower Leg Wound Status: Open Wounding Event: Trauma Date Acquired: 06/27/2019 Weeks Of Treatment: 21 Clustered Wound: No Wound Measurements Length: (cm) 0.6 Width: (cm) 0.3 Depth: (cm) 0.1 Area: (cm) 0.141 Volume: (cm) 0.014 % Reduction in Area: 98.7% % Reduction in Volume: 99.7% Wound Description Full Thickness Without Exposed Support Classification: Structures Treatment Notes Wound #11 (Right, Anterior Lower Leg) Notes hydrafera, BFD, patient's own compression Electronic Signature(s) Signed: 11/28/2019 11:42:17 AM By: Elliot Gurney, BSN, RN, CWS, Kim RN, BSN Entered By: Elliot Gurney, BSN, RN, CWS, Kim on 11/25/2019 13:46:44 [name] ([id number]) -------------------------------------------------------------------------------- Vitals Details Patient Name: [name] Date of Service: 11/25/2019 1:45 PM Medical Record Number: [id number] Patient Account Number: [id number] Date of Birth/Sex: Jun 04, 1930 (85 y.o. F) Treating RN: Huel Coventry Primary Care Jahliyah Trice: Aram Beecham Other Clinician: Referring Ellyana Crigler: Aram Beecham Treating Micharl Helmes/Extender: Linwood Dibbles, HOYT Weeks in Treatment: 21 Vital Signs Time  Taken: 13:40 Temperature (F): 98.4 Height (in): 60 Pulse (bpm): 69 Weight (lbs): 125 Respiratory Rate (breaths/min): 16 Body Mass Index (BMI): 24.4 Blood Pressure (mmHg): 154/55 Reference Range: 80  - 120 mg / dl Electronic Signature(s) Signed: 11/28/2019 11:42:17 AM By: Elliot GurneyWoody, BSN, RN, CWS, Kim RN, BSN Entered By: Elliot GurneyWoody, BSN, RN, CWS, Kim on 11/25/2019 13:42:45

## 2019-12-02 ENCOUNTER — Other Ambulatory Visit: Payer: Self-pay

## 2019-12-02 ENCOUNTER — Encounter: Payer: Medicare Other | Admitting: Physician Assistant

## 2019-12-02 DIAGNOSIS — S81801A Unspecified open wound, right lower leg, initial encounter: Secondary | ICD-10-CM | POA: Diagnosis not present

## 2019-12-02 NOTE — Progress Notes (Signed)
[name], [name] ([id number]) Visit Report for 12/02/2019 Arrival Information Details Patient Name: [name], [name]. Date of Service: 12/02/2019 1:00 PM Medical Record Number: [id number] Patient Account Number: [id number] Date of Birth/Sex: October 21, 1930 (85 y.o. F) Treating RN: Rodell Perna Primary Care Lalania Haseman: Aram Beecham Other Clinician: Referring Aerik Polan: Aram Beecham Treating Ashten Prats/Extender: Linwood Dibbles, HOYT Weeks in Treatment: 22 Visit Information History Since Last Visit Added or deleted any medications: No Patient Arrived: Walker Any new allergies or adverse reactions: No Arrival Time: 13:02 Had a fall or experienced change in No Accompanied By: self activities of daily living that may affect Transfer Assistance: None risk of falls: Patient Identification Verified: Yes Signs or symptoms of abuse/neglect since last visito No Patient Requires Transmission-Based Precautions: No Hospitalized since last visit: No Patient Has Alerts: No Has Dressing in Place as Prescribed: Yes Pain Present Now: No Electronic Signature(s) Signed: 12/02/2019 4:09:33 PM By: Rodell Perna Entered By: Rodell Perna on 12/02/2019 13:02:29 [name] ([id number]) -------------------------------------------------------------------------------- Clinic Level of Care Assessment Details Patient Name: [name] Date of Service: 12/02/2019 1:00 PM Medical Record Number: [id number] Patient Account Number: [id number] Date of Birth/Sex: 05/18/1930 (85 y.o. F) Treating RN: Arnette Norris Primary Care Yonatan Guitron: Aram Beecham Other Clinician: Referring Kalisha Keadle: Aram Beecham Treating Gaylen Pereira/Extender: Linwood Dibbles, HOYT Weeks in Treatment: 22 Clinic Level of Care Assessment Items TOOL 4 Quantity Score []  - Use when only an EandM is performed on FOLLOW-UP visit 0 ASSESSMENTS - Nursing Assessment / Reassessment X - Reassessment of Co-morbidities (includes updates in patient status) 1  10 X- 1 5 Reassessment of Adherence to Treatment Plan ASSESSMENTS - Wound and Skin Assessment / Reassessment X - Simple Wound Assessment / Reassessment - one wound 1 5 []  - 0 Complex Wound Assessment / Reassessment - multiple wounds []  - 0 Dermatologic / Skin Assessment (not related to wound area) ASSESSMENTS - Focused Assessment []  - Circumferential Edema Measurements - multi extremities 0 []  - 0 Nutritional Assessment / Counseling / Intervention []  - 0 Lower Extremity Assessment (monofilament, tuning fork, pulses) []  - 0 Peripheral Arterial Disease Assessment (using hand held doppler) ASSESSMENTS - Ostomy and/or Continence Assessment and Care []  - Incontinence Assessment and Management 0 []  - 0 Ostomy Care Assessment and Management (repouching, etc.) PROCESS - Coordination of Care X - Simple Patient / Family Education for ongoing care 1 15 []  - 0 Complex (extensive) Patient / Family Education for ongoing care []  - 0 Staff obtains , Records, Test Results / Process Orders []  - 0 Staff telephones HHA, Nursing Homes / Clarify orders / etc []  - 0 Routine Transfer to another Facility (non-emergent condition) []  - 0 Routine Hospital Admission (non-emergent condition) []  - 0 New Admissions / / Ordering NPWT, Apligraf, etc. []  - 0 Emergency Hospital Admission (emergent condition) X- 1 10 Simple Discharge Coordination ASHLA, MURPH ( ) []  - 0 Complex (extensive) Discharge Coordination PROCESS - Special Needs []  - Pediatric / Minor Patient Management 0 []  - 0 Isolation Patient Management []  - 0 Hearing / Language / Visual special needs []  - 0 Assessment of Community assistance (transportation, D/C planning, etc.) []  - 0 Additional assistance / Altered mentation []  - 0 Support Surface(s) Assessment (bed, cushion, seat, etc.) INTERVENTIONS - Wound Cleansing / Measurement X - Simple Wound Cleansing - one wound 1 5 []  - 0 Complex  Wound Cleansing - multiple wounds X- 1 5 Wound Imaging (photographs - any number of wounds) []  - 0 Wound Tracing (instead of photographs) X-  1 5 Simple Wound Measurement - one wound []  - 0 Complex Wound Measurement - multiple wounds INTERVENTIONS - Wound Dressings X - Small Wound Dressing one or multiple wounds 1 10 []  - 0 Medium Wound Dressing one or multiple wounds []  - 0 Large Wound Dressing one or multiple wounds []  - 0 Application of Medications - topical []  - 0 Application of Medications - injection INTERVENTIONS - Miscellaneous []  - External ear exam 0 []  - 0 Specimen Collection (cultures, biopsies, blood, body fluids, etc.) []  - 0 Specimen(s) / Culture(s) sent or taken to Lab for analysis []  - 0 Patient Transfer (multiple staff / Nurse, adultHoyer Lift / Similar devices) []  - 0 Simple Staple / Suture removal (25 or less) []  - 0 Complex Staple / Suture removal (26 or more) []  - 0 Hypo / Hyperglycemic Management (close monitor of Blood Glucose) []  - 0 Ankle / Brachial Index (ABI) - do not check if billed separately X- 1 5 Vital Signs [name], [name] ([id number]) Has the patient been seen at the hospital within the last three years: Yes Total Score: 75 Level Of Care: New/Established - Level 2 Electronic Signature(s) Signed: 12/02/2019 3:59:48 PM By: Arnette NorrisBiell, Kristina Entered By: Arnette NorrisBiell, Kristina on 12/02/2019 13:33:19 [name], [name] ([id number]) -------------------------------------------------------------------------------- Encounter Discharge Information Details Patient Name: [name], [name] Date of Service: 12/02/2019 1:00 PM Medical Record Number: [id number] Patient Account Number: [id number] Date of Birth/Sex: 05/04/1930 (85 y.o. F) Treating RN: Arnette NorrisBiell, Kristina Primary Care Kort Stettler: Aram BeechamSparks, Jeffrey Other Clinician: Referring Markeshia Giebel: Aram BeechamSparks, Jeffrey Treating Emrey Thornley/Extender: Linwood DibblesSTONE III, HOYT Weeks in Treatment: 22 Encounter Discharge Information Items Discharge  Condition: Stable Ambulatory Status: Walker Discharge Destination: Home Transportation: Private Auto Accompanied By: self Schedule Follow-up Appointment: Yes Clinical Summary of Care: Electronic Signature(s) Signed: 12/02/2019 3:59:48 PM By: Arnette NorrisBiell, Kristina Entered By: Arnette NorrisBiell, Kristina on 12/02/2019 13:35:16 [name], [name] ([id number]) -------------------------------------------------------------------------------- Lower Extremity Assessment Details Patient Name: [name], [name] Date of Service: 12/02/2019 1:00 PM Medical Record Number: [id number] Patient Account Number: [id number] Date of Birth/Sex: 02/07/1930 (85 y.o. F) Treating RN: Rodell PernaScott, Dajea Primary Care [place] Landgrebe: Aram BeechamSparks, Jeffrey Other Clinician: Referring Zoei Amison: Aram BeechamSparks, Jeffrey Treating Porsche Noguchi/Extender: STONE III, HOYT Weeks in Treatment: 22 Edema Assessment Assessed: [Left: No] [Right: No] Edema: [Left: N] [Right: o] Vascular Assessment Pulses: Dorsalis Pedis Palpable: [Right:Yes] Electronic Signature(s) Signed: 12/02/2019 4:09:33 PM By: Rodell PernaScott, Dajea Entered By: Rodell PernaScott, Dajea on 12/02/2019 13:06:50 [name], [name] ([id number]) -------------------------------------------------------------------------------- Multi Wound Chart Details Patient Name: [name], [name] Date of Service: 12/02/2019 1:00 PM Medical Record Number: [id number] Patient Account Number: [id number] Date of Birth/Sex: 12/31/1929 (85 y.o. F) Treating RN: Arnette NorrisBiell, Kristina Primary Care Karne Ozga: Aram BeechamSparks, Jeffrey Other Clinician: Referring Loucile Posner: Aram BeechamSparks, Jeffrey Treating Lasharon Dunivan/Extender: Linwood DibblesSTONE III, HOYT Weeks in Treatment: 22 Vital Signs Height(in): 60 Pulse(bpm): 66 Weight(lbs): 125 Blood Pressure(mmHg): 142/60 Body Mass Index(BMI): 24 Temperature(F): 97.5 Respiratory Rate 16 (breaths/min): Photos: [N/A:N/A] Wound Location: Right Lower Leg - Anterior N/A N/A Wounding Event: Trauma N/A N/A Primary Etiology: Skin Tear N/A  N/A Comorbid History: Cataracts, Glaucoma, Optic N/A N/A Neuritis, Chronic sinus problems/congestion, Middle ear problems Date Acquired: 06/27/2019 N/A N/A Weeks of Treatment: 22 N/A N/A Wound Status: Open N/A N/A Measurements L x W x D 0.5x1x0.1 N/A N/A (cm) Area (cm) : 0.393 N/A N/A Volume (cm) : 0.039 N/A N/A % Reduction in Area: 96.30% N/A N/A % Reduction in Volume: 99.30% N/A N/A Classification: Full Thickness Without N/A N/A Exposed Support Structures Exudate Amount: Medium N/A N/A Exudate Type: Serosanguineous N/A N/A Exudate Color: red,  brown N/A N/A Granulation Amount: Large (67-100%) N/A N/A Granulation Quality: Red N/A N/A Necrotic Amount: Small (1-33%) N/A N/A Exposed Structures: Fat Layer (Subcutaneous N/A N/A Tissue) Exposed: Yes Fascia: No Tendon: No Muscle: No SHAWNETTA, LEIN ([id number]) Joint: No Bone: No Treatment Notes Electronic Signature(s) Signed: 12/02/2019 3:59:48 PM By: Harold Barban Entered By: Harold Barban on 12/02/2019 13:29:39 [name] ([id number]) -------------------------------------------------------------------------------- [place] Details Patient Name: [name] Date of Service: 12/02/2019 1:00 PM Medical Record Number: [id number] Patient Account Number: [id number] Date of Birth/Sex: [birth year] (85 y.o. F) Treating RN: Harold Barban Primary Care Nefertiti Mohamad: Fulton Reek Other Clinician: Referring Ayaka Andes: Fulton Reek Treating Baljit Liebert/Extender: Melburn Hake, HOYT Weeks in Treatment: 51 Active Inactive Abuse / Safety / Falls / Self Care Management Nursing Diagnoses: Potential for falls Goals: Patient will remain injury free related to falls Date Initiated: 06/28/2019 Target Resolution Date: 09/21/2019 Goal Status: Active Interventions: Assess fall risk on admission and as needed Notes: Wound/Skin Impairment Nursing Diagnoses: Impaired tissue integrity Goals: Ulcer/skin breakdown  will heal within 14 weeks Date Initiated: 06/28/2019 Target Resolution Date: 09/21/2019 Goal Status: Active Interventions: Assess patient/caregiver ability to obtain necessary supplies Assess patient/caregiver ability to perform ulcer/skin care regimen upon admission and as needed Assess ulceration(s) every visit Notes: Electronic Signature(s) Signed: 12/02/2019 3:59:48 PM By: Harold Barban Entered By: Harold Barban on 12/02/2019 13:29:26 [name] ([id number]) -------------------------------------------------------------------------------- Pain Assessment Details Patient Name: [name] Date of Service: 12/02/2019 1:00 PM Medical Record Number: [id number] Patient Account Number: [id number] Date of Birth/Sex: 11/02/1930 (85 y.o. F) Treating RN: Army Melia Primary Care [place] Alfredo: Fulton Reek Other Clinician: Referring Maura Braaten: Fulton Reek Treating Cadence Minton/Extender: Melburn Hake, HOYT Weeks in Treatment: 22 Active Problems Location of Pain Severity and Description of Pain Patient Has Paino No Site Locations Pain Management and Medication Current Pain Management: Electronic Signature(s) Signed: 12/02/2019 4:09:33 PM By: Army Melia Entered By: Army Melia on 12/02/2019 13:02:37 [name] ([id number]) -------------------------------------------------------------------------------- Patient/Caregiver Education Details Patient Name: [name] Date of Service: 12/02/2019 1:00 PM Medical Record Number: [id number] Patient Account Number: [id number] Date of Birth/Gender: July 27, 1930 (85 y.o. F) Treating RN: Harold Barban Primary Care Physician: Fulton Reek Other Clinician: Referring Physician: Fulton Reek Treating Physician/Extender: Sharalyn Ink in Treatment: 22 Education Assessment Education Provided To: Patient Education Topics Provided Wound/Skin Impairment: Handouts: Caring for Your Ulcer Methods: Demonstration,  Explain/Verbal Responses: State content correctly Electronic Signature(s) Signed: 12/02/2019 3:59:48 PM By: Harold Barban Entered By: Harold Barban on 12/02/2019 13:29:59 [name] ([id number]) -------------------------------------------------------------------------------- Wound Assessment Details Patient Name: [name] Date of Service: 12/02/2019 1:00 PM Medical Record Number: [id number] Patient Account Number: [id number] Date of Birth/Sex: 1930/04/21 (85 y.o. F) Treating RN: Army Melia Primary Care Yechiel Erny: Fulton Reek Other Clinician: Referring Cire Clute: Fulton Reek Treating Berna Gitto/Extender: Melburn Hake, HOYT Weeks in Treatment: 22 Wound Status Wound Number: 11 Primary Skin Tear Etiology: Wound Location: Right Lower Leg - Anterior Wound Open Wounding Event: Trauma Status: Date Acquired: 06/27/2019 Comorbid Cataracts, Glaucoma, Optic Neuritis, Chronic Weeks Of Treatment: 22 History: sinus problems/congestion, Middle ear problems Clustered Wound: No Photos Wound Measurements Length: (cm) 0.5 Width: (cm) 1 Depth: (cm) 0.1 Area: (cm) 0.393 Volume: (cm) 0.039 % Reduction in Area: 96.3% % Reduction in Volume: 99.3% Tunneling: No Undermining: No Wound Description Full Thickness Without Exposed Support Fou Classification: Structures Slo Exudate Medium Amount: Exudate Type: Serosanguineous Exudate Color: red, brown l Odor After Cleansing: No ugh/Fibrino Yes Wound Bed Granulation Amount: Large (67-100%) Exposed Structure Granulation  Quality: Red Fascia Exposed: No Necrotic Amount: Small (1-33%) Fat Layer (Subcutaneous Tissue) Exposed: Yes Necrotic Quality: Adherent Slough Tendon Exposed: No Muscle Exposed: No Joint Exposed: No Bone Exposed: No Treatment Notes [name], Nastashia V. ([id number]) Wound #11 (Right, Anterior Lower Leg) Notes hydrafera, BFD, patient's own compression Electronic Signature(s) Signed: 12/02/2019 1:07:50 PM  By: Rodell PernaScott, Dajea Entered By: Rodell PernaScott, Dajea on 12/02/2019 13:07:49 [name], Shaunessy V. ([id number]) -------------------------------------------------------------------------------- Vitals Details Patient Name: [name], Caelynn V. Date of Service: 12/02/2019 1:00 PM Medical Record Number: [id number] Patient Account Number: [id number] Date of Birth/Sex: 08/10/1930 (85 y.o. F) Treating RN: Rodell PernaScott, Dajea Primary Care Tramya Schoenfelder: Aram BeechamSparks, Jeffrey Other Clinician: Referring Gailyn Crook: Aram BeechamSparks, Jeffrey Treating Alize Borrayo/Extender: Linwood DibblesSTONE III, HOYT Weeks in Treatment: 22 Vital Signs Time Taken: 13:02 Temperature (F): 97.5 Height (in): 60 Pulse (bpm): 66 Weight (lbs): 125 Respiratory Rate (breaths/min): 16 Body Mass Index (BMI): 24.4 Blood Pressure (mmHg): 142/60 Reference Range: 80 - 120 mg / dl Electronic Signature(s) Signed: 12/02/2019 4:09:33 PM By: Rodell PernaScott, Dajea Entered By: Rodell PernaScott, Dajea on 12/02/2019 13:04:59

## 2019-12-02 NOTE — Progress Notes (Addendum)
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 in follow-up evaluation for a left elbow wound. There is essentially no change. She is currently on Zithromax and will complete that on Friday, there is no indication to extend this. We will change to iodosorb/iodoflex and monitor for response 07/25/18-She is seen in follow-up evaluation for left elbow wound. The wound is stable with no overt evidence of infection. She has counseled with her rheumatologist. She is wanting to restart her methotrexate; a culture was obtained to rule out occult infection before starting her methotrexate. We will continue with Iodosorb/Iodoflex and she will follow-up next week. 08/01/18; this is a difficult wound over her left olecranon process. There is been concerned about infection although cultures including one done last week were negative. Pending 3 weeks ago I gave her an empiric course of antibiotics. She is having a lot of rheumatologic pain in her hands with pain and stiffness. She wants to go on her weekly methotrexate and I think it would be reasonable to  do so. We have been using Iodoflex 08/01/18; difficult wound over her left olecranon process. She started back on methotrexate last week because of rheumatologic pain in her hands. We have been using Iodoflex to try and clean out the wound bed. She has been approved for Graphix PL 08/15/18; 2 week follow-up. Difficult wound over her left olecranon process. Graphix PL #1 with collagen backing 08/22/18; one-week follow-up. Difficult wound over her left olecranon process. Graphix PL #2 08/29/18; no major improvement. Difficult wound over her left olecranon process. Still considerable undermining. Graphics PL #3 1 week follow-up. Graphix #4 09/12/18 graphics #5. Some improvement in wound area although the undermining superiorly still has not closed down as much as I would like 09/19/18; Graphix #6 I think there is improvement in the undermining from 7 to 9:00. Wound bed looks healthy. 09/26/18 Graffix #7 undermining is 0.5 cm maximally at roughly 8:00. From 12 to 7:00 the tissue is adherent which is a major improvement there is some advancing skin from this side. 10/03/18; Graphix #8 no major changes from last week 10/10/18 Graffix #9 There are improvements. There appears to be granulation coming up to the surface here and there is a lot less undermining at 8:00. 10/17/18. Graffix #10; Dimensions are improved less undermining surface felt the but the wound is still open. Initially a surgical wound following a bursectomy 10/24/18; Graffix #11. This is really stalled over the last 2 weeks. If there is no further improvement this will be the last application.The final option for this difficult area would be plastic surgery and will set up a consult with Dr. Marina Goodell in [facility] 10/31/18; wound looks about the same. The undermining superiorly is 0.7 cm. On the lateral edges perhaps some improvement there is no drainage. 11-07-2018 patient seen today for follow-up and management of left elbow wound. She has  completed a total of 11 treatments of the graffix  with not much improvement. She has an upcoming appointment with plastic surgery to assist with additional treatment options for the left elbow wound on 11/19/18. There is significant amount surrounding undermining of the wound is 0.9 cm. Currently prescribed methotrexate. Wound is being treated with Indoform and border dressing. No drainage from wound. No fever, chills. or pain. 11/21/18; the patient continues to have the wound looking roughly the same with undermining from about 12 to 6:00. This has not changed all that much. She does have skin irritation around the wound that looks like drainage maceration issues. The patient states that she was not able to have her wound dressing changed because of illness in the person he usually does this. She also did not attend her clinic appointment today with Dr. Marina Goodell because of transportation issues. She is rebooked for some time in mid January 11/28/2018; the patient has less undermining using endoform. As a understandings she saw Dr. Thad Ranger who is Dr. Lonni Fix partner. He recommended putting her in a elbow brace and I believe is written a prescription for it. He also recommended Motrin 800 mg 3 times daily. This is prescription strength ibuprofen although he did not write his prescription. This apparently was for 2 weeks. Culture I did last time grew a few methicillin sensitive staph aureus. After some difficulty due to drug intolerances/allergies and drug interactions I settled on a 5-day course of azithromycin [name], [name] ([id number]) 12/03/18 on evaluation today patient actually appears to be doing fairly well in regard to her elbow when compared to last time I evaluated her. With that being said there does not appear to be any signs of infection at this time. That was the big concern currently as far as the patient was concerned. Nonetheless I do feel like she is making progress in regard  to the feeling of this ulcer it has been slow. She did see a Engineer, petroleum they are talking about putting her in a brace in order to allow this area to heal more appropriately. 12/19/2018; not much change in this from the last time I have saw this.'s much smaller area than when she first came in and with less circumferential undermining however this is never really adhered. She is wearing the brace that was given or prescribed to her by plastics. She did not have a procedure offered to attempt to close this. We have been using endoform 1/15; wound actually is not doing as well as last week. She was actually not supposed to come into this clinic again until next week but apparently her attendant noticed some redness increasing pain and she came in early. She reports the same amount of drainage. We have been using endoform. She is approved through puraply however I will only consider starting that next week 1/22; she completed the antibiotics last week. Culture I did was negative. In spite of this there is less erythema and pain complaints in the wound. Puraply #1 applied today 1/29; Puraply #2 today. Wound surface looks a lot better post debridement of adherent fibrinous material. However undermining from 6-12 is measuring worse 2/5; Puraply #3. Using her elbow brace 2/12 puraply #4 2/19 puraply #5. The 9:00 undermining measured at 0.5 cm. Undermining from 4-11 o'clock. Surface of the wound looks better and the circumference of the wound is smaller however the undermining is not really changed 2/26; still not much improvement. She has undermining from 4-9 o'clock 0.9 cm. Surface of the wound covered and adherent debris. 3/4; still no improvement.  Undermining from 4-9 o'clock still around a centimeter. Surface of the wound looks somewhat better. No debridement is required we used endoform after we ended the trial of puraply last week 3/11; really no improvement at all. Still 1 cm undermining from  roughly 9-3 o'clock. This is about a centimeter. The base of the wound looks fairly healthy. No debridement. We have been using endoform. I am really out of most usual options here. I could consider either another round of an amniotic advanced treatment product example epifix or perhaps regranex. Understandably the patient is a bit frustrated. We did send her to plastic surgery for a consult. Other than prescribing her a brace to immobilize the elbow they did not think she was a candidate for any further surgery. Notable that the patient is not using the brace today 3/18-Patient returns for attention to the left elbow area which apparently looked red at the home health visit. Patient's elbow looks the same if not better compared with last visit. The area of ulceration remains the same, the base appears healthy. We are continuing to use endoform she has been encouraged to use the brace to keep the elbow straight 3/25; the patient has an appointment at the [facility] wound care center on 4/2. I had actually put her out indefinitely however she seems to want to come back here every week. This week she complains of increased pain and malodor. Dimensions of the elbow wound are larger. We have been using endoform 4/8; the patient went to [facility] where they apparently gave her meta honey and some border foam with a Tubigrip. She has been using this for a week. She says she was very impressed with them there. They did not offer her any surgical consultation. She seems to be coming back here for a second opinion on this, she does not wish to drive to [facility] every week 9/14; still using Medihoney foam border and a Tubigrip. Actually do not think she has anything on the arm at all specifically she is not using her brace 5/6; she has been using medihoney without a lot of improvement. Undermining maximum at 9:00 at 0.5 cm may be somewhat better. She is still complaining of discomfort. She uses her elbow brace at night but  is not using anything on the arm during the day, she finds it too restrictive 5/13; we switch the patient to endoform AG last week. She is complaining of more pain and worried about some circumferential erythema around the wound. I had planned to consider epifix in this wound however the patient came in with a request to see a plastic surgeon in [place] by the name of Dr. Wyline Mood who cared for a friend of hers. Noteworthy that I have already sent her to one plastic surgeon and she went for another second opinion at [facility] and they apparently did not send her to a plastic surgeon nevertheless the patient is fairly convinced that she might benefit from a skin graft which I am doubtful. She also has rheumatoid arthritis and is on methotrexate. This was originally a surgical wound for a bursectomy a year or 2 ago Readmission: 06/28/19 on evaluation today patient presents today for reevaluation but this is due to a new issue her elbow has completely healed and looks excellent. Her right anterior lower leg has a skin tear which was sustained from her dog who jumped up on her and inadvertently scratch the area causing the skin tear. This happened yesterday. She is having some discomfort but  fortunately nothing too significant which is good news. No fevers, chills, nausea, or vomiting noted at this time. The skin fortunately was knocked one completely off and we are gonna see about we approximate in the skin as best we can in using Steri-Strips to hold this in place obviously if we can get some of this to reattach that would be beneficial ELYSABETH, AUST. ([id number]) 07/05/2019 on evaluation today patient actually appears to be doing okay although unfortunately the skin flap that we were attempting to Steri-Stripped down last week did not take. She is developing a lot of fluid underneath the wound area unfortunately which again is not ideal. I think that this necrotic tissue needs to be removed and again  it actually just wiped off during the evaluation today as I was attempting to clean the wound there did not appear to be any significant issues underlying which is good although there was some purulent drainage I did want to go ahead and see about obtaining a culture from today in order to ensure that the Z-Pak that I placed her on earlier in the week was appropriate for treating what ever infection may be causing the issue currently. This was a deep wound culture obtained today. 07/12/2019 on evaluation today patient actually appears to be doing quite well with regard to her right lower extremity ulcer all things considering. There is still little bit of hematoma not it that is noted in the central portion of the wound along with some necrotic tissue but she is still having quite a bit of discomfort. For that reason I did not perform sharp debridement today although this wound does need some debridement of one type or another. I think we may attempt Iodoflex to see if this can be of benefit. Fortunately there is no signs of active infection at this point. 07/18/19 upon evaluation today patient appears to be doing better with regard to her ulcer on her right lower extremity. She's been tolerating the dressing changes without complication. The good news is she seems to be making excellent progress. Overall I'm pleased with there being no signs of infection. With that being said she does tell me that she's been having some discomfort with the wrap she's unsure of exactly what about the rafters causing her trouble. 07/25/2019 on evaluation today patient appears to be doing a little better in regard to her lower extremity ulcer. Unfortunately the alginate seems to be getting really stuck to the wound bed and surrounding periwound. Fortunately there is no signs of active infection at this time. There does not appear to be any evidence of infection currently. 08/01/2019 on evaluation today patient actually  appears to be doing much better with regard to her right lower extremity ulcer. She has been tolerating the dressing changes without complication. Fortunately her wound does not show any signs of infection and seems to be making good progress. No fevers, chills, nausea, vomiting, or diarrhea. 08/08/2019 on evaluation today patient actually appears to be doing better with regard to her leg ulcer. She has been tolerating the dressing changes without complication. With that being said I think we may want to switch the dressing up today just based on the appearance of the wound bed in general. Fortunately there is no evidence of infection currently. Her pain seems to be much better. Her son is present with her today. 08/15/2019 on evaluation today patient appears to be doing very well with regard to her right lower extremity ulcer. She still complains  about feeling like the compression stocking/Tubigrip is too tight for her. Nonetheless I still think this is beneficial and is helping the wound to heal more effectively. Overall I am extremely pleased at this time with what I am seeing. 08/22/2019 on evaluation today patient actually appears to be doing quite well with regard to her right lower extremity. She has been tolerating the dressing changes without complication. Fortunately there is no signs of active infection at this time. She still complains about the Tubigrip but nonetheless I think this is something that is of utmost importance for her to continue to wear if she is going to see this area heal appropriately. 08/29/2019 on evaluation today patient actually appears to be doing well visually in regard to her wound. Unfortunately in regard to overall pain she seems to be having more pain today which I am somewhat concerned about. There does not appear to be any signs of active infection that I can tell but again with increased pain that is definitely a concern here today. I do think it may be time to  switch up the dressing as well to something that will be a little bit more effective hopefully and new tissue growth she has done well with collagen in the past. 09/05/2019 on evaluation today patient actually appears to be doing well with regard to her leg ulcer. She has been tolerating the dressing changes without complication. Fortunately there is no signs of active infection. We did obtain a wound culture last week due to the fact that she still is very touchy as far as the surface of the wound is concerned. Unfortunately I am not sure what happened to that culture as there is no record in the computer system. Nonetheless we need to follow-up on this and try to see if we can identify what exactly happened with this specimen as we have not gotten a result back either obviously. 09/12/2019 on evaluation today patient actually appears to be showing signs of good improvement upon evaluation today. She states her pain is not nearly as bad as what it was which is also good news. She did have a wound culture which was reviewed today that showed no growth of bacteria which is good news. This coupled with the fact that her wound appears to be healing better, measuring smaller, and overall even the slough buildup is minimal compared to what we have seen previously, I feel like she is actually showing signs of excellent improvement today. KELISHA, DALL ([id number]) 09/19/2019 on evaluation today patient appears to be doing well with regard to her wounds on evaluation today. She is showing signs of continued improvement week by week and everything is measuring significantly smaller which is great news. Overall very pleased with the progress that she has made. 09/26/2019 on evaluation today patient appears to be doing fairly well with regard to her right lower extremity ulcer. She does continue to have some drainage and again the smaller of the 2 wounds is not making as much progress as I like to see.  We have been doing the silver collagen for some time I think we may want to switch to [facility] she has a little bit of hyper granular tissue this may help to keep things from becoming too dramatic in that regard. Fortunately there is no signs of infection at this time. 10/03/2019 on evaluation today patient appears to be doing fairly well with regard to her lower extremity ulcers at this time. She has been tolerating  the dressing changes without complication. Fortunately the wound is measuring smaller which is great news she continues to make great progress. 10/17/2019 on evaluation today patient appears to be doing well with regard to her wounds of her right lower extremity. She states she has been having some increased pain apparently we gave her a little bit smaller size Tubigrip last time she was here this may have been what was causing some issues. She is out of the Tubigrip that we had ordered for her and therefore did not have any of the smaller size. Fortunately there is no evidence of active infection at this time. No fever chills noted 10/24/2019 upon evaluation today patient's wounds actually appear to be showing signs of good improvement which is excellent news. She has been tolerating the dressing changes without complication and overall I am rather pleased at this point with how things seem to be progressing. The patient likewise is very happy. She still was not happy with having to use the Tubigrip she states that bunches up in her shoe. 10/31/2019 on evaluation today patient actually appears to be doing quite well with regard to her leg ulcer. She has been tolerating the dressing changes without complication. Fortunately there is no signs of active infection at this time. Overall I am very pleased with the progress that she is made. She does wonder if we could switch to the bordered adhesive Hydrofera Blue I think that something we can definitely look into for her. 11/15/2019  patient on evaluation today appears to be making excellent progress. Her wound is significantly smaller and the wound bed appears to be dramatically improved compared to even the last visit. Fortunately she is having really little to no pain at this time. No fevers, chills, nausea, vomiting, or diarrhea. 11/25/2019 on evaluation today patient appears to be doing well with regard to her lower extremity ulcer. She has been tolerating dressing changes without complication. Fortunately there is no signs of active infection at this time. No fevers, chills, nausea, vomiting, or diarrhea. 12/02/2019 on evaluation today patient unfortunately is not doing quite as well as what we would have liked to have seen based on last week's evaluation. Fortunately there is no signs of active infection at this time. With that being said she is continuing to experience some issues with hyper granulation she was doing much better with the [facility] prior to putting the contact layer in between her skin and the [facility] I think that we need to go back to that. She just can have to be very careful with removing the dressing in order to ensure it does not pull skin and get stuck. Patient History Information obtained from Patient. Family History Heart Disease - Siblings,Father, No family history of Cancer, Diabetes, Hereditary Spherocytosis, Hypertension, Kidney Disease, Lung Disease, Seizures, Stroke, Thyroid Problems, Tuberculosis. Social History Never smoker, Alcohol Use - Daily, Drug Use - No History, Caffeine Use - Never. Medical History Eyes Patient has history of Cataracts, Glaucoma, Optic Neuritis Ear/Nose/Mouth/Throat SHAKTHI, SCIPIO ([id number]) Patient has history of Chronic sinus problems/congestion, Middle ear problems Medical And Surgical History Notes Constitutional Symptoms (General Health) Back pain Ear/Nose/Mouth/Throat bilateral hearing aides Review of Systems (ROS) Constitutional  Symptoms (General Health) Denies complaints or symptoms of Fatigue, Fever, Chills, Marked Weight Change. Hematologic/Lymphatic Denies complaints or symptoms of Bleeding / Clotting Disorders, Human Immunodeficiency Virus. Respiratory Denies complaints or symptoms of Chronic or frequent coughs, Shortness of Breath. Psychiatric Denies complaints or symptoms of Anxiety, Claustrophobia. Objective Constitutional  Well-nourished and well-hydrated in no acute distress. Vitals Time Taken: 1:02 PM, Height: 60 in, Weight: 125 lbs, BMI: 24.4, Temperature: 97.5 F, Pulse: 66 bpm, Respiratory Rate: 16 breaths/min, Blood Pressure: 142/60 mmHg. Respiratory normal breathing without difficulty. clear to auscultation bilaterally. Cardiovascular regular rate and rhythm with normal S1, S2. Psychiatric this patient is able to make decisions and demonstrates good insight into disease process. Alert and Oriented x 3. pleasant and cooperative. General Notes: Patient's wound bed currently showed signs of good granulation at this time. Fortunately there is no signs of active infection which is good news. With that being said some of the granulation tissue is somewhat hyper granular which is kind of slowing down the healing and preventing this from completely closing. Fortunately there is no evidence of active infection however which is excellent news. I discussed this with the patient today as well. Integumentary (Hair, Skin) Wound #11 status is Open. Original cause of wound was Trauma. The wound is located on the Right,Anterior Lower Leg. The wound measures 0.5cm length x 1cm width x 0.1cm depth; 0.393cm^2 area and 0.039cm^3 volume. There is Fat Layer (Subcutaneous Tissue) Exposed exposed. There is no tunneling or undermining noted. There is a medium amount of serosanguineous drainage noted. There is large (67-100%) red granulation within the wound bed. There is a small (1-33%) amount of necrotic tissue within  the wound bed including Adherent Slough. RAYCHELL, HOLCOMB ([id number]) Assessment Active Problems ICD-10 Unspecified open wound, right lower leg, initial encounter Non-pressure chronic ulcer of other part of right lower leg with fat layer exposed Struck by dog, initial encounter Plan Wound Cleansing: Wound #11 Right,Anterior Lower Leg: Clean wound with Normal Saline. Primary Wound Dressing: Wound #11 Right,Anterior Lower Leg: Hydrafera Blue Ready Transfer - bordered foam Dressing Change Frequency: Wound #11 Right,Anterior Lower Leg: Change dressing every other day. Follow-up Appointments: Return Appointment in 2 weeks. Edema Control: Wound #11 Right,Anterior Lower Leg: Patient to wear own compression stockings 1. I would recommend currently that we go ahead and initiate a continuation of the [facility] although we will discontinue the contact layer as I feel like this has been counterproductive unfortunately. 2. I would recommend as well that she continue with her compression stocking which I do think is controlling her edema quite well. We will see patient back for reevaluation in 2 weeks here in the clinic. If anything worsens or changes patient will contact our office for additional recommendations. Electronic Signature(s) Signed: 12/02/2019 1:40:24 PM By: Lenda Kelp PA-C Entered By: Lenda Kelp on 12/02/2019 13:40:24 [name] ([id number]) -------------------------------------------------------------------------------- ROS/PFSH Details Patient Name: [name] Date of Service: 12/02/2019 1:00 PM Medical Record Number: [id number] Patient Account Number: [id number] Date of Birth/Sex: 1930/03/11 (85 y.o. F) Treating RN: Arnette Norris Primary Care Provider: Aram Beecham Other Clinician: Referring Provider: Aram Beecham Treating Provider/Extender: Linwood Dibbles, Essance Gatti Weeks in Treatment: 22 Information Obtained From Patient Constitutional Symptoms  (General Health) Complaints and Symptoms: Negative for: Fatigue; Fever; Chills; Marked Weight Change Medical History: Past Medical History Notes: Back pain Hematologic/Lymphatic Complaints and Symptoms: Negative for: Bleeding / Clotting Disorders; Human Immunodeficiency Virus Respiratory Complaints and Symptoms: Negative for: Chronic or frequent coughs; Shortness of Breath Psychiatric Complaints and Symptoms: Negative for: Anxiety; Claustrophobia Eyes Medical History: Positive for: Cataracts; Glaucoma; Optic Neuritis Ear/Nose/Mouth/Throat Medical History: Positive for: Chronic sinus problems/congestion; Middle ear problems Past Medical History Notes: bilateral hearing aides HBO Extended History Items Ear/Nose/Mouth/Throat: Eyes: Eyes: Ear/Nose/Mouth/Throat: Chronic sinus Cataracts Glaucoma Middle ear problems problems/congestion  Immunizations Pneumococcal Vaccine: Received Pneumococcal Vaccination: No Implantable Devices Natasha BassetHENRY, Earsie V. ([id number]) Yes Family and Social History Cancer: No; Diabetes: No; Heart Disease: Yes - Siblings,Father; Hereditary Spherocytosis: No; Hypertension: No; Kidney Disease: No; Lung Disease: No; Seizures: No; Stroke: No; Thyroid Problems: No; Tuberculosis: No; Never smoker; Alcohol Use: Daily; Drug Use: No History; Caffeine Use: Never; Financial Concerns: No; Food, Clothing or Shelter Needs: No; Support System Lacking: No; Transportation Concerns: No Physician Affirmation I have reviewed and agree with the above information. Electronic Signature(s) Signed: 12/02/2019 3:59:48 PM By: Arnette NorrisBiell, Kristina Signed: 12/02/2019 6:06:28 PM By: Lenda KelpStone III, Tametra Ahart PA-C Entered By: Lenda KelpStone III, Daphnie Venturini on 12/02/2019 13:39:17 Natasha BassetHENRY, Jaela V. ([id number]) -------------------------------------------------------------------------------- SuperBill Details Patient Name: Natasha BassetHENRY, Kuuipo V. Date of Service: 12/02/2019 Medical Record Number: [id number] Patient Account  Number: [id number] Date of Birth/Sex: 03/31/1930 (85 y.o. F) Treating RN: Arnette NorrisBiell, Kristina Primary Care Provider: Aram BeechamSparks, Jeffrey Other Clinician: Referring Provider: Aram BeechamSparks, Jeffrey Treating Provider/Extender: Linwood DibblesSTONE III, Znya Albino Weeks in Treatment: 22 Diagnosis Coding ICD-10 Codes Code Description S81.801A Unspecified open wound, right lower leg, initial encounter L97.812 Non-pressure chronic ulcer of other part of right lower leg with fat layer exposed W54.1XXA Struck by dog, initial encounter Facility Procedures CPT4 Code: [id number] Description: [id number] - WOUND CARE VISIT-LEV 2 EST PT Modifier: Quantity: 1 Physician Procedures CPT4 Code Description: [id number] - WC PHYS LEVEL 4 - EST PT ICD-10 Diagnosis Description S81.801A Unspecified open wound, right lower leg, initial encounter L97.812 Non-pressure chronic ulcer of other part of right lower leg wi W54.1XXA Struck by  dog, initial encounter Modifier: th fat layer expo Quantity: 1 sed Electronic Signature(s) Signed: 12/02/2019 1:40:41 PM By: Lenda KelpStone III, Donnel Venuto PA-C Entered By: Lenda KelpStone III, Allisson Schindel on 12/02/2019 13:40:40

## 2019-12-16 ENCOUNTER — Encounter: Payer: Medicare Other | Attending: Physician Assistant | Admitting: Physician Assistant

## 2019-12-16 ENCOUNTER — Other Ambulatory Visit: Payer: Self-pay

## 2019-12-16 DIAGNOSIS — M069 Rheumatoid arthritis, unspecified: Secondary | ICD-10-CM | POA: Insufficient documentation

## 2019-12-16 DIAGNOSIS — L97812 Non-pressure chronic ulcer of other part of right lower leg with fat layer exposed: Secondary | ICD-10-CM | POA: Insufficient documentation

## 2019-12-16 DIAGNOSIS — Z79899 Other long term (current) drug therapy: Secondary | ICD-10-CM | POA: Insufficient documentation

## 2019-12-16 DIAGNOSIS — Z950 Presence of cardiac pacemaker: Secondary | ICD-10-CM | POA: Diagnosis not present

## 2019-12-16 NOTE — Progress Notes (Addendum)
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 ([id number]) 06/04/18 on evaluation today patient actually presents for follow-up concerning her left elbow and left lower from the ulcer she seems to be doing very well at this point in time. She has been tolerating the dressing changes without complication. With that being said her elbow is not significantly better she actually is scheduled for surgery  tomorrow. 07/04/18; the patient had an area on her left leg that is remaining closed. The open area she has now is a postsurgical wound on the left elbow. I think we have clearance from the surgeon to see this now. We're using Prisma 07/11/18; we're currently dealing with a surgical wound on the left olecranon process. The patient complains of a lot of pain and drainage. When I saw her last week we did an x-ray that showed soft tissue wound and probable elbow joint effusion but no erosion to suggest osteomyelitis. The culture I did of this was somewhat surprisingly negative. She has a small open wound with not a viable surface there is considerable undermining relative to the wound size. She is on methotrexate for rheumatoid arthritis/overlap syndrome also plaquenil. We've been using silver collagen 07/18/18-She is seen in follow-up evaluation for a left elbow wound. There is essentially no change. She is currently on Zithromax and will complete that on Friday, there is no indication to extend this. We will change to iodosorb/iodoflex and monitor for response 07/25/18-She is seen in follow-up evaluation for left elbow wound. The wound is stable with no overt evidence of infection. She has counseled with her rheumatologist. She is wanting to restart her methotrexate; a culture was obtained to rule out occult infection before starting her methotrexate. We will continue with Iodosorb/Iodoflex and she will follow-up next week. 08/01/18; this is a difficult wound over her left olecranon process. There is been concerned about infection although cultures including one done last week were negative. Pending 3 weeks ago I gave her an empiric course of antibiotics. She is having a lot of rheumatologic pain in her hands with pain and stiffness. She wants to go on her weekly methotrexate and I think it would be reasonable to do so. We have been using Iodoflex 08/01/18; difficult wound over her left olecranon process.  She started back on methotrexate last week because of rheumatologic pain in her hands. We have been using Iodoflex to try and clean out the wound bed. She has been approved for Graphix PL 08/15/18; 2 week follow-up. Difficult wound over her left olecranon process. Graphix PL #1 with collagen backing 08/22/18; one-week follow-up. Difficult wound over her left olecranon process. Graphix PL #2 08/29/18; no  major improvement. Difficult wound over her left olecranon process. Still considerable undermining. Graphics PL #3 1 week follow-up. Graphix #4 09/12/18 graphics #5. Some improvement in wound area although the undermining superiorly still has not closed down as much as I would like 09/19/18; Graphix #6 I think there is improvement in the undermining from 7 to 9:00. Wound bed looks healthy. 09/26/18 Graffix #7 undermining is 0.5 cm maximally at roughly 8:00. From 12 to 7:00 the tissue is adherent which is a major improvement there is some advancing skin from this side. 10/03/18; Graphix #8 no major changes from last week 10/10/18 Graffix #9 There are improvements. There appears to be granulation coming up to the surface here and there is a lot less undermining at 8:00. 10/17/18. Graffix #10; Dimensions are improved less undermining surface felt the but the wound is still open. Initially a surgical wound following a bursectomy 10/24/18; Graffix #11. This is really stalled over the last 2 weeks. If there is no further improvement this will be the last application.The final option for this difficult area would be plastic surgery and will set up a consult with Dr. Marina [name] in [facility] 10/31/18; wound looks about the same. The undermining superiorly is 0.7 cm. On the lateral edges perhaps some improvement there is no drainage. 11-07-2018 patient seen today for follow-up and management of left elbow wound. She has completed a total of 11 treatments of the graffix with not much improvement. She has an  upcoming appointment with plastic surgery to assist with additional treatment options for the left elbow wound on 11/19/18. There is significant amount surrounding undermining of the wound is 0.9 cm. Currently prescribed methotrexate. Wound is being treated with Indoform and border dressing. No drainage from wound. No fever, chills. or pain. 11/21/18; the patient continues to have the wound looking roughly the same with undermining from about 12 to 6:00. This has not changed all that much. She does have skin irritation around the wound that looks like drainage maceration issues. The patient states that she was not able to have her wound dressing changed because of illness in the person he usually does this. She also did not attend her clinic appointment today with Dr. Marina [name] because of transportation issues. She is rebooked for some time in mid January 11/28/2018; the patient has less undermining using endoform. As a understandings she saw Dr. Thad Ranger who is Dr. Lonni Fix partner. He recommended putting her in a elbow brace and I believe is written a prescription for it. He also recommended Motrin 800 mg 3 times daily. This is prescription strength ibuprofen although he did not write his prescription. This apparently was for 2 weeks. Culture I did last time grew a few methicillin sensitive staph aureus. After some difficulty due to drug intolerances/allergies and drug interactions I settled on a 5-day course of azithromycin [name], [name] ([id number]) 12/03/18 on evaluation today patient actually appears to be doing fairly well in regard to her elbow when compared to last time I evaluated her. With that being said there does not appear to be any signs of infection at this time. That was the big concern currently as far as the patient was concerned. Nonetheless I do feel like she is making progress in regard to the feeling of this ulcer it has been slow. She did see a Engineer, petroleum they are  talking about putting her in a brace in order to allow this area to heal more appropriately. 12/19/2018; not much change in this from the  last time I have saw this.'s much smaller area than when she first came in and with less circumferential undermining however this is never really adhered. She is wearing the brace that was given or prescribed to her by plastics. She did not have a procedure offered to attempt to close this. We have been using endoform 1/15; wound actually is not doing as well as last week. She was actually not supposed to come into this clinic again until next week but apparently her attendant noticed some redness increasing pain and she came in early. She reports the same amount of drainage. We have been using endoform. She is approved through puraply however I will only consider starting that next week 1/22; she completed the antibiotics last week. Culture I did was negative. In spite of this there is less erythema and pain complaints in the wound. Puraply #1 applied today 1/29; Puraply #2 today. Wound surface looks a lot better post debridement of adherent fibrinous material. However undermining from 6-12 is measuring worse 2/5; Puraply #3. Using her elbow brace 2/12 puraply #4 2/19 puraply #5. The 9:00 undermining measured at 0.5 cm. Undermining from 4-11 o'clock. Surface of the wound looks better and the circumference of the wound is smaller however the undermining is not really changed 2/26; still not much improvement. She has undermining from 4-9 o'clock 0.9 cm. Surface of the wound covered and adherent debris. 3/4; still no improvement. Undermining from 4-9 o'clock still around a centimeter. Surface of the wound looks somewhat better. No debridement is required we used endoform after we ended the trial of puraply last week 3/11; really no improvement at all. Still 1 cm undermining from roughly 9-3 o'clock. This is about a centimeter. The base of the wound looks fairly  healthy. No debridement. We have been using endoform. I am really out of most usual options here. I could consider either another round of an amniotic advanced treatment product example epifix or perhaps regranex. Understandably the patient is a bit frustrated. We did send her to plastic surgery for a consult. Other than prescribing her a brace to immobilize the elbow they did not think she was a candidate for any further surgery. Notable that the patient is not using the brace today 3/18-Patient returns for attention to the left elbow area which apparently looked red at the home health visit. Patient's elbow looks the same if not better compared with last visit. The area of ulceration remains the same, the base appears healthy. We are continuing to use endoform she has been encouraged to use the brace to keep the elbow straight 3/25; the patient has an appointment at the [facility] wound care center on 4/2. I had actually put her out indefinitely however she seems to want to come back here every week. This week she complains of increased pain and malodor. Dimensions of the elbow wound are larger. We have been using endoform 4/8; the patient went to [facility] where they apparently gave her meta honey and some border foam with a Tubigrip. She has been using this for a week. She says she was very impressed with them there. They did not offer her any surgical consultation. She seems to be coming back here for a second opinion on this, she does not wish to drive to [facility] every week 6/27; still using Medihoney foam border and a Tubigrip. Actually do not think she has anything on the arm at all specifically she is not using her brace 5/6; she has been using  medihoney without a lot of improvement. Undermining maximum at 9:00 at 0.5 cm may be somewhat better. She is still complaining of discomfort. She uses her elbow brace at night but is not using anything on the arm during the day, she finds it too  restrictive 5/13; we switch the patient to endoform AG last week. She is complaining of more pain and worried about some circumferential erythema around the wound. I had planned to consider epifix in this wound however the patient came in with a request to see a plastic surgeon in [place] by the name of Dr. Wyline Mood who cared for a friend of hers. Noteworthy that I have already sent her to one plastic surgeon and she went for another second opinion at [facility] and they apparently did not send her to a plastic surgeon nevertheless the patient is fairly convinced that she might benefit from a skin graft which I am doubtful. She also has rheumatoid arthritis and is on methotrexate. This was originally a surgical wound for a bursectomy a year or 2 ago Readmission: 06/28/19 on evaluation today patient presents today for reevaluation but this is due to a new issue her elbow has completely healed and looks excellent. Her right anterior lower leg has a skin tear which was sustained from her dog who jumped up on her and inadvertently scratch the area causing the skin tear. This happened yesterday. She is having some discomfort but fortunately nothing too significant which is good news. No fevers, chills, nausea, or vomiting noted at this time. The skin fortunately was knocked one completely off and we are gonna see about we approximate in the skin as best we can in using Steri-Strips to hold this in place obviously if we can get some of this to reattach that would be beneficial [name], [name]. ([id number]) 07/05/2019 on evaluation today patient actually appears to be doing okay although unfortunately the skin flap that we were attempting to Steri-Stripped down last week did not take. She is developing a lot of fluid underneath the wound area unfortunately which again is not ideal. I think that this necrotic tissue needs to be removed and again it actually just wiped off during the evaluation today as I was  attempting to clean the wound there did not appear to be any significant issues underlying which is good although there was some purulent drainage I did want to go ahead and see about obtaining a culture from today in order to ensure that the Z-Pak that I placed her on earlier in the week was appropriate for treating what ever infection may be causing the issue currently. This was a deep wound culture obtained today. 07/12/2019 on evaluation today patient actually appears to be doing quite well with regard to her right lower extremity ulcer all things considering. There is still little bit of hematoma not it that is noted in the central portion of the wound along with some necrotic tissue but she is still having quite a bit of discomfort. For that reason I did not perform sharp debridement today although this wound does need some debridement of one type or another. I think we may attempt Iodoflex to see if this can be of benefit. Fortunately there is no signs of active infection at this point. 07/18/19 upon evaluation today patient appears to be doing better with regard to her ulcer on her right lower extremity. She's been tolerating the dressing changes without complication. The good news is she seems to be making excellent progress.  Overall I'm pleased with there being no signs of infection. With that being said she does tell me that she's been having some discomfort with the wrap she's unsure of exactly what about the rafters causing her trouble. 07/25/2019 on evaluation today patient appears to be doing a little better in regard to her lower extremity ulcer. Unfortunately the alginate seems to be getting really stuck to the wound bed and surrounding periwound. Fortunately there is no signs of active infection at this time. There does not appear to be any evidence of infection currently. 08/01/2019 on evaluation today patient actually appears to be doing much better with regard to her right lower  extremity ulcer. She has been tolerating the dressing changes without complication. Fortunately her wound does not show any signs of infection and seems to be making good progress. No fevers, chills, nausea, vomiting, or diarrhea. 08/08/2019 on evaluation today patient actually appears to be doing better with regard to her leg ulcer. She has been tolerating the dressing changes without complication. With that being said I think we may want to switch the dressing up today just based on the appearance of the wound bed in general. Fortunately there is no evidence of infection currently. Her pain seems to be much better. Her son is present with her today. 08/15/2019 on evaluation today patient appears to be doing very well with regard to her right lower extremity ulcer. She still complains about feeling like the compression stocking/Tubigrip is too tight for her. Nonetheless I still think this is beneficial and is helping the wound to heal more effectively. Overall I am extremely pleased at this time with what I am seeing. 08/22/2019 on evaluation today patient actually appears to be doing quite well with regard to her right lower extremity. She has been tolerating the dressing changes without complication. Fortunately there is no signs of active infection at this time. She still complains about the Tubigrip but nonetheless I think this is something that is of utmost importance for her to continue to wear if she is going to see this area heal appropriately. 08/29/2019 on evaluation today patient actually appears to be doing well visually in regard to her wound. Unfortunately in regard to overall pain she seems to be having more pain today which I am somewhat concerned about. There does not appear to be any signs of active infection that I can tell but again with increased pain that is definitely a concern here today. I do think it may be time to switch up the dressing as well to something that will be a  little bit more effective hopefully and new tissue growth she has done well with collagen in the past. 09/05/2019 on evaluation today patient actually appears to be doing well with regard to her leg ulcer. She has been tolerating the dressing changes without complication. Fortunately there is no signs of active infection. We did obtain a wound culture last week due to the fact that she still is very touchy as far as the surface of the wound is concerned. Unfortunately I am not sure what happened to that culture as there is no record in the computer system. Nonetheless we need to follow-up on this and try to see if we can identify what exactly happened with this specimen as we have not gotten a result back either obviously. 09/12/2019 on evaluation today patient actually appears to be showing signs of good improvement upon evaluation today. She states her pain is not nearly as bad as  what it was which is also good news. She did have a wound culture which was reviewed today that showed no growth of bacteria which is good news. This coupled with the fact that her wound appears to be healing better, measuring smaller, and overall even the slough buildup is minimal compared to what we have seen previously, I feel like she is actually showing signs of excellent improvement today. [name], [name] ([id number]) 09/19/2019 on evaluation today patient appears to be doing well with regard to her wounds on evaluation today. She is showing signs of continued improvement week by week and everything is measuring significantly smaller which is great news. Overall very pleased with the progress that she has made. 09/26/2019 on evaluation today patient appears to be doing fairly well with regard to her right lower extremity ulcer. She does continue to have some drainage and again the smaller of the 2 wounds is not making as much progress as I like to see. We have been doing the silver collagen for some time I think we  may want to switch to [facility] she has a little bit of hyper granular tissue this may help to keep things from becoming too dramatic in that regard. Fortunately there is no signs of infection at this time. 10/03/2019 on evaluation today patient appears to be doing fairly well with regard to her lower extremity ulcers at this time. She has been tolerating the dressing changes without complication. Fortunately the wound is measuring smaller which is great news she continues to make great progress. 10/17/2019 on evaluation today patient appears to be doing well with regard to her wounds of her right lower extremity. She states she has been having some increased pain apparently we gave her a little bit smaller size Tubigrip last time she was here this may have been what was causing some issues. She is out of the Tubigrip that we had ordered for her and therefore did not have any of the smaller size. Fortunately there is no evidence of active infection at this time. No fever chills noted 10/24/2019 upon evaluation today patient's wounds actually appear to be showing signs of good improvement which is excellent news. She has been tolerating the dressing changes without complication and overall I am rather pleased at this point with how things seem to be progressing. The patient likewise is very happy. She still was not happy with having to use the Tubigrip she states that bunches up in her shoe. 10/31/2019 on evaluation today patient actually appears to be doing quite well with regard to her leg ulcer. She has been tolerating the dressing changes without complication. Fortunately there is no signs of active infection at this time. Overall I am very pleased with the progress that she is made. She does wonder if we could switch to the bordered adhesive Hydrofera Blue I think that something we can definitely look into for her. 11/15/2019 patient on evaluation today appears to be making excellent  progress. Her wound is significantly smaller and the wound bed appears to be dramatically improved compared to even the last visit. Fortunately she is having really little to no pain at this time. No fevers, chills, nausea, vomiting, or diarrhea. 11/25/2019 on evaluation today patient appears to be doing well with regard to her lower extremity ulcer. She has been tolerating dressing changes without complication. Fortunately there is no signs of active infection at this time. No fevers, chills, nausea, vomiting, or diarrhea. 12/02/2019 on evaluation today patient unfortunately is  not doing quite as well as what we would have liked to have seen based on last week's evaluation. Fortunately there is no signs of active infection at this time. With that being said she is continuing to experience some issues with hyper granulation she was doing much better with the [facility] prior to putting the contact layer in between her skin and the [facility] I think that we need to go back to that. She just can have to be very careful with removing the dressing in order to ensure it does not pull skin and get stuck. 12/16/2019 on evaluation today patient appears to be doing better her wound is measuring significantly smaller at this time compared to where things were previous. Fortunately there is no evidence of active infection at this point which is also good news. No fever chills noted. Objective Constitutional Well-nourished and well-hydrated in no acute distress. [name], [name] ([id number]) Vitals Time Taken: 12:55 PM, Height: 60 in, Weight: 125 lbs, BMI: 24.4, Temperature: 98.4 F, Pulse: 68 bpm, Respiratory Rate: 16 breaths/min, Blood Pressure: 146/58 mmHg. Respiratory normal breathing without difficulty. Psychiatric this patient is able to make decisions and demonstrates good insight into disease process. Alert and Oriented x 3. pleasant and cooperative. General Notes: Patient's wound did  actually require some sharp debridement to clear away some of the dry skin and drainage from around the edge of the wound as well as a slight film on the surface of the wound of slough/biofilm. She tolerated that today without complication and post debridement the wound bed actually appears to be doing significantly better which is great news. Integumentary (Hair, Skin) Wound #11 status is Open. Original cause of wound was Trauma. The wound is located on the Right,Anterior Lower Leg. The wound measures 0.2cm length x 0.2cm width x 0.1cm depth; 0.031cm^2 area and 0.003cm^3 volume. There is Fat Layer (Subcutaneous Tissue) Exposed exposed. There is no tunneling or undermining noted. There is a medium amount of serosanguineous drainage noted. There is large (67-100%) red granulation within the wound bed. There is a small (1-33%) amount of necrotic tissue within the wound bed including Adherent Slough. Assessment Active Problems ICD-10 Unspecified open wound, right lower leg, initial encounter Non-pressure chronic ulcer of other part of right lower leg with fat layer exposed Struck by dog, initial encounter Procedures Wound #11 Pre-procedure diagnosis of Wound #11 is a Skin Tear located on the Right,Anterior Lower Leg . There was a Excisional Skin/Subcutaneous Tissue Debridement with a total area of 0.04 sq cm performed by STONE III, Zilpha Mcandrew E., PA-C. With the following instrument(s): Curette to remove Viable and Non-Viable tissue/material. Material removed includes Subcutaneous Tissue and Slough and after achieving pain control using Lidocaine. No specimens were taken. A time out was conducted at 13:17, prior to the start of the procedure. There was no bleeding. The procedure was tolerated well with a pain level of 0 throughout and a pain level of 0 following the procedure. Post Debridement Measurements: 0.2cm length x 0.2cm width x 0.1cm depth; 0.003cm^3 volume. Character of Wound/Ulcer Post  Debridement is improved. Post procedure Diagnosis Wound #11: Same as Pre-Procedure [name], [name] ([id number]) Plan Wound Cleansing: Wound #11 Right,Anterior Lower Leg: Clean wound with Normal Saline. Primary Wound Dressing: Wound #11 Right,Anterior Lower Leg: Collagen Secondary Dressing: Wound #11 Right,Anterior Lower Leg: Conform/Kerlix - conform Non-adherent pad - Telfa Pad Dressing Change Frequency: Wound #11 Right,Anterior Lower Leg: Change dressing every other day. Follow-up Appointments: Return Appointment in 1 week. Edema Control:  Wound #11 Right,Anterior Lower Leg: Patient to wear own compression stockings 1. My suggestion at this point is going to be that we go ahead and continue to monitor the wound at this time I am going to switch away from the [facility] dressing as primary and we will utilize plain collagen moistened with hydrogel as I feel like the wound is becoming somewhat dry which may be slowing down a bit. We will cover this area with a border foam dressing. 2.With regard to the patient's edema she has been doing excellent wearing her own compression stockings and I feel like she should continue as such at this point I do not see any need to make any changes since she is healing and doing so well. We will see patient back for reevaluation in 1 week here in the clinic. If anything worsens or changes patient will contact our office for additional recommendations. Electronic Signature(s) Signed: 12/16/2019 1:32:51 PM By: Lenda Kelp PA-C Previous Signature: 12/16/2019 1:32:42 PM Version By: Lenda Kelp PA-C Entered By: Lenda Kelp on 12/16/2019 13:32:50 [name] ([id number]) -------------------------------------------------------------------------------- SuperBill Details Patient Name: [name] Date of Service: 12/16/2019 Medical Record Number: [id number] Patient Account Number: [id number] Date of Birth/Sex: 1930-11-24 (86 y.o.  F) Treating RN: Arnette Norris Primary Care Provider: Aram Beecham Other Clinician: Referring Provider: Aram Beecham Treating Provider/Extender: Linwood Dibbles, Aiyanna Awtrey Weeks in Treatment: 24 Diagnosis Coding ICD-10 Codes Code Description S81.801A Unspecified open wound, right lower leg, initial encounter L97.812 Non-pressure chronic ulcer of other part of right lower leg with fat layer exposed W54.1XXA Struck by dog, initial encounter Facility Procedures CPT4 Code Description: [id number] [phone] - DEB SUBQ TISSUE 20 SQ CM/< ICD-10 Diagnosis Description L97.812 Non-pressure chronic ulcer of other part of right lower leg wit Modifier: h fat layer expo Quantity: 1 sed Physician Procedures CPT4 Code Description: 7846962 11042 - WC PHYS SUBQ TISS 20 SQ CM ICD-10 Diagnosis Description L97.812 Non-pressure chronic ulcer of other part of right lower leg wit Modifier: h fat layer expo Quantity: 1 sed Electronic Signature(s) Signed: 12/16/2019 1:33:11 PM By: Lenda Kelp PA-C Entered By: Lenda Kelp on 12/16/2019 13:33:11

## 2019-12-16 NOTE — Progress Notes (Signed)
BRANDILYN, NANNINGA ([id number]) Visit Report for 12/16/2019 Arrival Information Details Patient Name: [name], [name] Date of Service: 12/16/2019 12:45 PM Medical Record Number: [id number] Patient Account Number: [id number] Date of Birth/Sex: Nov 26, 1930 (86 y.o. F) Treating RN: Montey Hora Primary Care Jonni Oelkers: Fulton Reek Other Clinician: Referring Dinisha Cai: Fulton Reek Treating Shuronda Santino/Extender: Melburn Hake, HOYT Weeks in Treatment: 24 Visit Information History Since Last Visit Added or deleted any medications: No Patient Arrived: Walker Any new allergies or adverse reactions: No Arrival Time: 12:53 Had a fall or experienced change in No Accompanied By: self activities of daily living that may affect Transfer Assistance: None risk of falls: Patient Identification Verified: Yes Signs or symptoms of abuse/neglect since last visito No Secondary Verification Process Completed: Yes Hospitalized since last visit: No Patient Requires Transmission-Based Precautions: No Implantable device outside of the clinic excluding No Patient Has Alerts: No cellular tissue based products placed in the center since last visit: Has Dressing in Place as Prescribed: Yes Pain Present Now: No Electronic Signature(s) Signed: 12/16/2019 4:34:19 PM By: Montey Hora Entered By: Montey Hora on 12/16/2019 12:54:58 [name] ([id number]) -------------------------------------------------------------------------------- Encounter Discharge Information Details Patient Name: [name] Date of Service: 12/16/2019 12:45 PM Medical Record Number: [id number] Patient Account Number: [id number] Date of Birth/Sex: [birth year] (86 y.o. F) Treating RN: Harold Barban Primary Care Erin Obando: Fulton Reek Other Clinician: Referring Kirstina Leinweber: Fulton Reek Treating Melanee Cordial/Extender: Melburn Hake, HOYT Weeks in Treatment: 24 Encounter Discharge Information Items Post Procedure Vitals Discharge  Condition: Stable Temperature (F): 98.4 Ambulatory Status: Walker Pulse (bpm): 68 Discharge Destination: Home Respiratory Rate (breaths/min): 16 Transportation: Private Auto Blood Pressure (mmHg): 146/58 Accompanied By: self Schedule Follow-up Appointment: Yes Clinical Summary of Care: Electronic Signature(s) Signed: 12/16/2019 4:45:12 PM By: Harold Barban Entered By: Harold Barban on 12/16/2019 13:24:15 [name] ([id number]) -------------------------------------------------------------------------------- Lower Extremity Assessment Details Patient Name: [name] Date of Service: 12/16/2019 12:45 PM Medical Record Number: [id number] Patient Account Number: [id number] Date of Birth/Sex: 04-10-1930 (86 y.o. F) Treating RN: Montey Hora Primary Care Anmol Paschen: Fulton Reek Other Clinician: Referring Diontae Route: Fulton Reek Treating Christal Lagerstrom/Extender: Melburn Hake, HOYT Weeks in Treatment: 24 Edema Assessment Assessed: [Left: No] [Right: No] Edema: [Left: Ye] [Right: s] Vascular Assessment Pulses: Dorsalis Pedis Palpable: [Right:Yes] Electronic Signature(s) Signed: 12/16/2019 4:34:19 PM By: Montey Hora Entered By: Montey Hora on 12/16/2019 13:03:49 [name] ([id number]) -------------------------------------------------------------------------------- Multi Wound Chart Details Patient Name: [name] Date of Service: 12/16/2019 12:45 PM Medical Record Number: [id number] Patient Account Number: [id number] Date of Birth/Sex: [birth year] (86 y.o. F) Treating RN: Harold Barban Primary Care Genetta Fiero: Fulton Reek Other Clinician: Referring Charlize Hathaway: Fulton Reek Treating Baley Lorimer/Extender: Melburn Hake, HOYT Weeks in Treatment: 24 Vital Signs Height(in): 60 Pulse(bpm): 68 Weight(lbs): 125 Blood Pressure(mmHg): 146/58 Body Mass Index(BMI): 24 Temperature(F): 98.4 Respiratory Rate 16 (breaths/min): Photos: [N/A:N/A] Wound Location:  Right Lower Leg - Anterior N/A N/A Wounding Event: Trauma N/A N/A Primary Etiology: Skin Tear N/A N/A Comorbid History: Cataracts, Glaucoma, Optic N/A N/A Neuritis, Chronic sinus problems/congestion, Middle ear problems Date Acquired: 06/27/2019 N/A N/A Weeks of Treatment: 24 N/A N/A Wound Status: Open N/A N/A Measurements L x W x D 0.2x0.2x0.1 N/A N/A (cm) Area (cm) : 0.031 N/A N/A Volume (cm) : 0.003 N/A N/A % Reduction in Area: 99.70% N/A N/A % Reduction in Volume: 99.90% N/A N/A Classification: Full Thickness Without N/A N/A Exposed Support Structures Exudate Amount: Medium N/A N/A Exudate Type: Serosanguineous N/A N/A Exudate Color: red, brown N/A N/A Granulation Amount: Large (67-100%)  N/A N/A Granulation Quality: Red N/A N/A Necrotic Amount: Small (1-33%) N/A N/A Exposed Structures: Fat Layer (Subcutaneous N/A N/A Tissue) Exposed: Yes Fascia: No Tendon: No Muscle: No [name], [name] ([id number]) Joint: No Bone: No Epithelialization: Medium (34-66%) N/A N/A Treatment Notes Electronic Signature(s) Signed: 12/16/2019 4:45:12 PM By: Arnette Norris Entered By: Arnette Norris on 12/16/2019 13:15:33 [name] ([id number]) -------------------------------------------------------------------------------- Multi-Disciplinary Care Plan Details Patient Name: [name] Date of Service: 12/16/2019 12:45 PM Medical Record Number: [id number] Patient Account Number: [id number] Date of Birth/Sex: 21-Apr-1930 (86 y.o. F) Treating RN: Arnette Norris Primary Care Fabiola Mudgett: Aram Beecham Other Clinician: Referring Aireanna Luellen: Aram Beecham Treating Keyari Kleeman/Extender: Linwood Dibbles, HOYT Weeks in Treatment: 24 Active Inactive Abuse / Safety / Falls / Self Care Management Nursing Diagnoses: Potential for falls Goals: Patient will remain injury free related to falls Date Initiated: 06/28/2019 Target Resolution Date: 09/21/2019 Goal Status: Active Interventions: Assess  fall risk on admission and as needed Notes: Wound/Skin Impairment Nursing Diagnoses: Impaired tissue integrity Goals: Ulcer/skin breakdown will heal within 14 weeks Date Initiated: 06/28/2019 Target Resolution Date: 09/21/2019 Goal Status: Active Interventions: Assess patient/caregiver ability to obtain necessary supplies Assess patient/caregiver ability to perform ulcer/skin care regimen upon admission and as needed Assess ulceration(s) every visit Notes: Electronic Signature(s) Signed: 12/16/2019 4:45:12 PM By: Arnette Norris Entered By: Arnette Norris on 12/16/2019 13:15:26 [name] ([id number]) -------------------------------------------------------------------------------- Pain Assessment Details Patient Name: [name] Date of Service: 12/16/2019 12:45 PM Medical Record Number: [id number] Patient Account Number: [id number] Date of Birth/Sex: Jun 16, 1930 (86 y.o. F) Treating RN: Curtis Sites Primary Care Nyanna Heideman: Aram Beecham Other Clinician: Referring Elexius Minar: Aram Beecham Treating Malcolm Quast/Extender: Linwood Dibbles, HOYT Weeks in Treatment: 24 Active Problems Location of Pain Severity and Description of Pain Patient Has Paino No Site Locations Pain Management and Medication Current Pain Management: Electronic Signature(s) Signed: 12/16/2019 4:34:19 PM By: Curtis Sites Entered By: Curtis Sites on 12/16/2019 12:55:05 [name] ([id number]) -------------------------------------------------------------------------------- Patient/Caregiver Education Details Patient Name: [name] Date of Service: 12/16/2019 12:45 PM Medical Record Number: [id number] Patient Account Number: [id number] Date of Birth/Gender: [birth year] (86 y.o. F) Treating RN: Arnette Norris Primary Care Physician: Aram Beecham Other Clinician: Referring Physician: Aram Beecham Treating Physician/Extender: Skeet Simmer in Treatment: 24 Education  Assessment Education Provided To: Patient Education Topics Provided Wound/Skin Impairment: Handouts: Caring for Your Ulcer Methods: Demonstration, Explain/Verbal Responses: State content correctly Electronic Signature(s) Signed: 12/16/2019 4:45:12 PM By: Arnette Norris Entered By: Arnette Norris on 12/16/2019 13:16:09 [name] ([id number]) -------------------------------------------------------------------------------- Wound Assessment Details Patient Name: [name] Date of Service: 12/16/2019 12:45 PM Medical Record Number: [id number] Patient Account Number: [id number] Date of Birth/Sex: [birth year] (86 y.o. F) Treating RN: Curtis Sites Primary Care Maryann Mccall: Aram Beecham Other Clinician: Referring Mikinzie Maciejewski: Aram Beecham Treating Abraham Entwistle/Extender: Linwood Dibbles, HOYT Weeks in Treatment: 24 Wound Status Wound Number: 11 Primary Skin Tear Etiology: Wound Location: Right Lower Leg - Anterior Wound Open Wounding Event: Trauma Status: Date Acquired: 06/27/2019 Comorbid Cataracts, Glaucoma, Optic Neuritis, Chronic Weeks Of Treatment: 24 History: sinus problems/congestion, Middle ear problems Clustered Wound: No Photos Wound Measurements Length: (cm) 0.2 Width: (cm) 0.2 Depth: (cm) 0.1 Area: (cm) 0.031 Volume: (cm) 0.003 % Reduction in Area: 99.7% % Reduction in Volume: 99.9% Epithelialization: Medium (34-66%) Tunneling: No Undermining: No Wound Description Full Thickness Without Exposed Support Foul Classification: Structures Slou Exudate Medium Amount: Exudate Type: Serosanguineous Exudate Color: red, brown Odor After Cleansing: No gh/Fibrino Yes Wound Bed Granulation Amount: Large (67-100%) Exposed Structure Granulation  Quality: Red Fascia Exposed: No Necrotic Amount: Small (1-33%) Fat Layer (Subcutaneous Tissue) Exposed: Yes Necrotic Quality: Adherent Slough Tendon Exposed: No Muscle Exposed: No Joint Exposed: No Bone Exposed:  No Treatment Notes [name], [name] ([id number]) Wound #11 (Right, Anterior Lower Leg) Notes Collagen Telfa pad, conform, patient's own compression Electronic Signature(s) Signed: 12/16/2019 4:34:19 PM By: Curtis Sites Entered By: Curtis Sites on 12/16/2019 13:02:57 [name] ([id number]) -------------------------------------------------------------------------------- Vitals Details Patient Name: [name] Date of Service: 12/16/2019 12:45 PM Medical Record Number: [id number] Patient Account Number: [id number] Date of Birth/Sex: [birth year] (86 y.o. F) Treating RN: Curtis Sites Primary Care Alfonzia Woolum: Aram Beecham Other Clinician: Referring Warnie Belair: Aram Beecham Treating Kinser Fellman/Extender: Linwood Dibbles, HOYT Weeks in Treatment: 24 Vital Signs Time Taken: 12:55 Temperature (F): 98.4 Height (in): 60 Pulse (bpm): 68 Weight (lbs): 125 Respiratory Rate (breaths/min): 16 Body Mass Index (BMI): 24.4 Blood Pressure (mmHg): 146/58 Reference Range: 80 - 120 mg / dl Electronic Signature(s) Signed: 12/16/2019 4:34:19 PM By: Curtis Sites Entered By: Curtis Sites on 12/16/2019 12:58:08

## 2019-12-23 ENCOUNTER — Other Ambulatory Visit: Payer: Self-pay

## 2019-12-23 ENCOUNTER — Encounter: Payer: Medicare Other | Admitting: Physician Assistant

## 2019-12-23 DIAGNOSIS — L97812 Non-pressure chronic ulcer of other part of right lower leg with fat layer exposed: Secondary | ICD-10-CM | POA: Diagnosis not present

## 2019-12-23 NOTE — Progress Notes (Signed)
ANNALESE, Barnett ([id number]) Visit Report for 12/23/2019 Arrival Information Details Patient Name: [name], [name] Date of Service: 12/23/2019 1:00 PM Medical Record Number: [id number] Patient Account Number: [id number] Date of Birth/Sex: 03-06-1930 (86 y.o. F) Treating Barnett: [name] Primary Care [name]: [name] Other Clinician: Referring [name]: [name] Treating [name]/Extender: [name], [name]: 25 Visit Information History Since Last Visit Added or deleted any medications: No Patient Arrived: [name] Any new allergies or adverse reactions: No Arrival Time: 12:58 Had a fall or experienced change in No Accompanied By: caregiver activities of daily living that may affect Transfer Assistance: None risk of falls: Patient Identification Verified: Yes Signs or symptoms of abuse/neglect since last visito No Secondary Verification Process Completed: Yes Hospitalized since last visit: No Patient Requires Transmission-Based No Implantable device outside of the clinic excluding No Precautions: cellular tissue based products placed in the center Patient Has Alerts: No since last visit: Has Dressing in Place as Prescribed: Yes Pain Present Now: No Electronic Signature(s) Signed: 12/23/2019 5:00:56 PM By: [name], BSN, Barnett, CWS, [name], BSN Entered By: [name], BSN, Barnett, CWS, Natasha on 12/23/2019 12:59:22 [name] ([id number]) -------------------------------------------------------------------------------- Clinic Level of Care Assessment Details Patient Name: [name] Date of Service: 12/23/2019 1:00 PM Medical Record Number: [id number] Patient Account Number: [id number] Date of Birth/Sex: Apr 19, 1930 (86 y.o. F) Treating Barnett: [name] Primary Care [name]: [name] Other Clinician: Referring [name]: [name] Treating [name]/Extender: [name], [name]: 25 Clinic Level of Care Assessment Items TOOL 4  Quantity Score []  - Use when only an EandM is performed on FOLLOW-UP visit 0 ASSESSMENTS - Nursing Assessment / Reassessment X - Reassessment of Co-morbidities (includes updates in patient status) 1 10 X- 1 5 Reassessment of Adherence to Barnett Plan ASSESSMENTS - Wound and Skin Assessment / Reassessment X - Simple Wound Assessment / Reassessment - one wound 1 5 []  - 0 Complex Wound Assessment / Reassessment - multiple wounds []  - 0 Dermatologic / Skin Assessment (not related to wound area) ASSESSMENTS - Focused Assessment []  - Circumferential Edema Measurements - multi extremities 0 []  - 0 Nutritional Assessment / Counseling / Intervention []  - 0 Lower Extremity Assessment (monofilament, tuning fork, pulses) []  - 0 Peripheral Arterial Disease Assessment (using hand held doppler) ASSESSMENTS - Ostomy and/or Continence Assessment and Care []  - Incontinence Assessment and Management 0 []  - 0 Ostomy Care Assessment and Management (repouching, etc.) PROCESS - Coordination of Care X - Simple Patient / Family Education for ongoing care 1 15 []  - 0 Complex (extensive) Patient / Family Education for ongoing care []  - 0 Staff obtains Programmer, systems, Records, Test Results / Process Orders []  - 0 Staff telephones HHA, Nursing Homes / Clarify orders / etc []  - 0 Routine Transfer to another Facility (non-emergent condition) []  - 0 Routine Hospital Admission (non-emergent condition) []  - 0 New Admissions / Biomedical engineer / Ordering NPWT, Apligraf, etc. []  - 0 Emergency Hospital Admission (emergent condition) X- 1 10 Simple Discharge Coordination [name] ([id number]) []  - 0 Complex (extensive) Discharge Coordination PROCESS - Special Needs []  - Pediatric / Minor Patient Management 0 []  - 0 Isolation Patient Management []  - 0 Hearing / Language / Visual special needs []  - 0 Assessment of Community assistance (transportation, D/C planning, etc.) []  - 0 Additional  assistance / Altered mentation []  - 0 Support Surface(s) Assessment (bed, cushion, seat, etc.) INTERVENTIONS - Wound Cleansing / Measurement X - Simple Wound Cleansing - one  wound 1 5 []  - 0 Complex Wound Cleansing - multiple wounds X- 1 5 Wound Imaging (photographs - any number of wounds) []  - 0 Wound Tracing (instead of photographs) X- 1 5 Simple Wound Measurement - one wound []  - 0 Complex Wound Measurement - multiple wounds INTERVENTIONS - Wound Dressings []  - Small Wound Dressing one or multiple wounds 0 []  - 0 Medium Wound Dressing one or multiple wounds []  - 0 Large Wound Dressing one or multiple wounds []  - 0 Application of Medications - topical []  - 0 Application of Medications - injection INTERVENTIONS - Miscellaneous []  - External ear exam 0 []  - 0 Specimen Collection (cultures, biopsies, blood, body fluids, etc.) []  - 0 Specimen(s) / Culture(s) sent or taken to Lab for analysis []  - 0 Patient Transfer (multiple staff / / Similar devices) []  - 0 Simple Staple / Suture removal (25 or less) []  - 0 Complex Staple / Suture removal (26 or more) []  - 0 Hypo / Hyperglycemic Management (close monitor of Blood Glucose) []  - 0 Ankle / Brachial Index (ABI) - do not check if billed separately X- 1 5 Vital Signs [name], [name] ( ) Has the patient been seen at the hospital within the last three years: Yes Total Score: 65 Level Of Care: New/Established - Level 2 Electronic Signature(s) Signed: 12/23/2019 5:00:56 PM By: , BSN, Barnett, CWS, [name], BSN Entered By: , BSN, Barnett, CWS, Natasha on 12/23/2019 13:22:01 ( ) -------------------------------------------------------------------------------- Encounter Discharge Information Details Patient Name: Date of Service: 12/23/2019 1:00 PM Medical Record Number: Patient Account Number: Nurse, adult Date of Birth/Sex: 02/11/1930 (86 y.o. F) Treating Barnett:  Primary Care [name]: Other Clinician: Referring [name]: [name] Treating [name] Hallum/Extender: [id number], [name]: 25 Encounter Discharge Information Items Discharge Condition: Stable Ambulatory Status: Ambulatory Discharge Destination: Home Transportation: Private Auto Accompanied By: caregiver Schedule Follow-up Appointment: Yes Clinical Summary of Care: Electronic Signature(s) Signed: 12/23/2019 5:00:56 PM By: Elliot Gurney, BSN, Barnett, CWS, [name], BSN Entered By: Elliot Gurney, BSN, Barnett, CWS, Natasha on 12/23/2019 13:23:02 [name] ([id number]) -------------------------------------------------------------------------------- Lower Extremity Assessment Details Patient Name: [name], [name] Date of Service: 12/23/2019 1:00 PM Medical Record Number: [id number] Patient Account Number: [id number] Date of Birth/Sex: 03/11/1930 (86 y.o. F) Treating Barnett: Huel Coventry Primary Care Denis Carreon: Aram Beecham Other Clinician: Referring Liel Rudden: Aram Beecham Treating Radiance Deady/Extender: Linwood Dibbles, [name]: 25 Edema Assessment Assessed: [Left: No] [Right: No] Edema: [Left: N] [Right: o] Vascular Assessment Pulses: Dorsalis Pedis Palpable: [Right:Yes] Electronic Signature(s) Signed: 12/23/2019 5:00:56 PM By: Elliot Gurney, BSN, Barnett, CWS, [name], BSN Entered By: Elliot Gurney, BSN, Barnett, CWS, Natasha on 12/23/2019 13:00:44 [name] ([id number]) -------------------------------------------------------------------------------- Multi Wound Chart Details Patient Name: [name] Date of Service: 12/23/2019 1:00 PM Medical Record Number: [id number] Patient Account Number: [id number] Date of Birth/Sex: Aug 21, 1930 (86 y.o. F) Treating Barnett: Huel Coventry Primary Care Khaleah Duer: Aram Beecham Other Clinician: Referring Elijahjames Fuelling: Aram Beecham Treating Deren Degrazia/Extender: Linwood Dibbles, [name]: 25 Vital Signs Height(in): 60 Pulse(bpm):  68 Weight(lbs): 125 Blood Pressure(mmHg): 151/53 Body Mass Index(BMI): 24 Temperature(F): 97.7 Respiratory Rate 16 (breaths/min): Photos: [N/A:N/A] Wound Location: Right Lower Leg - Anterior N/A N/A Wounding Event: Trauma N/A N/A Primary Etiology: Skin Tear N/A N/A Comorbid History: Cataracts, Glaucoma, Optic N/A N/A Neuritis, Chronic sinus problems/congestion, Middle ear problems Date Acquired: 06/27/2019 N/A N/A Weeks of Barnett: 25 N/A N/A Wound Status: Healed - Epithelialized N/A N/A Measurements  L x W x D 0x0x0 N/A N/A (cm) Area (cm) : 0 N/A N/A Volume (cm) : 0 N/A N/A % Reduction in Area: 100.00% N/A N/A % Reduction in Volume: 100.00% N/A N/A Classification: Full Thickness Without N/A N/A Exposed Support Structures Epithelialization: Large (67-100%) N/A N/A Barnett Notes Electronic Signature(s) Signed: 12/23/2019 5:00:56 PM By: Elliot Gurney, BSN, Barnett, CWS, [name], BSN Entered By: Elliot Gurney, BSN, Barnett, CWS, Natasha on 12/23/2019 13:21:14 [name] ([id number]) -------------------------------------------------------------------------------- Multi-Disciplinary Care Plan Details Patient Name: [name] Date of Service: 12/23/2019 1:00 PM Medical Record Number: [id number] Patient Account Number: [id number] Date of Birth/Sex: 1930/05/08 (86 y.o. F) Treating Barnett: Huel Coventry Primary Care Rosella Crandell: Aram Beecham Other Clinician: Referring Sarin Comunale: Aram Beecham Treating Kalyiah Saintil/Extender: Linwood Dibbles, [name]: 25 Active Inactive Electronic Signature(s) Signed: 12/23/2019 5:00:56 PM By: Elliot Gurney, BSN, Barnett, CWS, [name], BSN Entered By: Elliot Gurney, BSN, Barnett, CWS, Natasha on 12/23/2019 13:21:04 [name] ([id number]) -------------------------------------------------------------------------------- Pain Assessment Details Patient Name: [name] Date of Service: 12/23/2019 1:00 PM Medical Record Number: [id number] Patient Account Number: [id number] Date of  Birth/Sex: Jun 08, 1930 (86 y.o. F) Treating Barnett: Huel Coventry Primary Care Cadarius Nevares: Aram Beecham Other Clinician: Referring Janaisa Birkland: Aram Beecham Treating Clearance Chenault/Extender: Linwood Dibbles, [name]: 25 Active Problems Location of Pain Severity and Description of Pain Patient Has Paino No Site Locations Pain Management and Medication Current Pain Management: Electronic Signature(s) Signed: 12/23/2019 5:00:56 PM By: Elliot Gurney, BSN, Barnett, CWS, [name], BSN Entered By: Elliot Gurney, BSN, Barnett, CWS, Natasha on 12/23/2019 12:59:29 [name] ([id number]) -------------------------------------------------------------------------------- Patient/Caregiver Education Details Patient Name: [name] Date of Service: 12/23/2019 1:00 PM Medical Record Number: [id number] Patient Account Number: [id number] Date of Birth/Gender: Nov 20, 1930 (86 y.o. F) Treating Barnett: Huel Coventry Primary Care Physician: Aram Beecham Other Clinician: Referring Physician: Aram Beecham Treating Physician/Extender: Skeet Simmer in Barnett: 25 Education Assessment Education Provided To: Patient Education Topics Provided Venous: Handouts: Controlling Swelling with Compression Stockings Methods: Demonstration, Explain/Verbal Responses: State content correctly Wound/Skin Impairment: Electronic Signature(s) Signed: 12/23/2019 5:00:56 PM By: Elliot Gurney, BSN, Barnett, CWS, [name], BSN Entered By: Elliot Gurney, BSN, Barnett, CWS, Natasha on 12/23/2019 13:22:40 [name] ([id number]) -------------------------------------------------------------------------------- Wound Assessment Details Patient Name: [name] Date of Service: 12/23/2019 1:00 PM Medical Record Number: [id number] Patient Account Number: [id number] Date of Birth/Sex: [birth year] (86 y.o. F) Treating Barnett: Huel Coventry Primary Care Daelyn Pettaway: Aram Beecham Other Clinician: Referring Lawan Nanez: Aram Beecham Treating Bryanna Yim/Extender: Linwood Dibbles,  [name]: 25 Wound Status Wound Number: [id number] Primary Skin Tear Etiology: Wound Location: Right Lower Leg - Anterior Wound Healed - Epithelialized Wounding Event: Trauma Status: Date Acquired: 06/27/2019 Comorbid Cataracts, Glaucoma, Optic Neuritis, Chronic Weeks Of Barnett: 25 History: sinus problems/congestion, Middle ear problems Clustered Wound: No Photos Wound Measurements Length: (cm) 0 % Width: (cm) 0 % Depth: (cm) 0 E Area: (cm) 0 Volume: (cm) 0 Reduction in Area: 100% Reduction in Volume: 100% pithelialization: Large (67-100%) Tunneling: No Undermining: No Wound Description Full Thickness Without Exposed Support Classification: Structures Electronic Signature(s) Signed: 12/23/2019 5:00:56 PM By: Elliot Gurney, BSN, Barnett, CWS, [name], BSN Entered By: Elliot Gurney, BSN, Barnett, CWS, Natasha on 12/23/2019 13:00:21 [name] ([id number]) -------------------------------------------------------------------------------- Vitals Details Patient Name: [name] Date of Service: 12/23/2019 1:00 PM Medical Record Number: [id number] Patient Account Number: [id number] Date of Birth/Sex: 1930-01-15 (86 y.o. F) Treating Barnett: Huel Coventry Primary Care Samyiah Halvorsen: Aram Beecham Other Clinician: Referring Pihu Basil: Aram Beecham Treating Naesha Buckalew/Extender: Linwood Dibbles, Natasha  Weeks in Barnett: 25 Vital Signs Time Taken: 13:00 Temperature (F): 97.7 Height (in): 60 Pulse (bpm): 68 Weight (lbs): 125 Respiratory Rate (breaths/min): 16 Body Mass Index (BMI): 24.4 Blood Pressure (mmHg): 151/53 Reference Range: 80 - 120 mg / dl Electronic Signature(s) Signed: 12/23/2019 5:00:56 PM By: Elliot Gurney, BSN, Barnett, CWS, [name], BSN Entered By: Elliot Gurney, BSN, Barnett, CWS, Natasha on 12/23/2019 13:02:55

## 2019-12-23 NOTE — Progress Notes (Addendum)
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 week were negative. Pending 3 weeks ago I gave her an empiric course of antibiotics. She is having a lot of rheumatologic pain in her hands with pain and stiffness. She wants to go on her weekly methotrexate and I think it would be reasonable to do so. We have been using Iodoflex 08/01/18; difficult wound over her left olecranon process. She started back on methotrexate last week because of rheumatologic  pain in her hands. We have been using Iodoflex to try and clean out the wound bed. She has been approved for Graphix PL 08/15/18; 2 week follow-up. Difficult wound over her left olecranon process. Graphix PL #1 with collagen backing 08/22/18; one-week follow-up. Difficult wound over her left olecranon process. Graphix PL #2 08/29/18; no major improvement. Difficult wound over her left olecranon process. Still considerable undermining. Graphics PL #3 1 week follow-up. Graphix #4 09/12/18 graphics #5. Some improvement in wound area although the undermining superiorly still has not closed down as much as I would like 09/19/18; Graphix #6 I think there is improvement in the undermining from 7 to 9:00. Wound bed looks healthy. 09/26/18 Graffix #7 undermining is 0.5 cm maximally at roughly 8:00. From 12 to 7:00 the tissue is adherent which is a major improvement there is some advancing skin from this side. 10/03/18; Graphix #8 no major changes from last week 10/10/18 Graffix #9 There are improvements. There appears to be granulation coming up to the surface here and there is a lot less undermining at 8:00. 10/17/18. Graffix #10; Dimensions are improved less undermining surface felt the but the wound is still open. Initially a surgical wound following a bursectomy 10/24/18; Graffix #11. This is really stalled over the last 2 weeks. If there is no further improvement this will be the last application.The final option for this difficult area would be plastic surgery and will set up a consult with Dr. Marina Goodell in [facility] 10/31/18; wound looks about the same. The undermining superiorly is 0.7 cm. On the lateral edges perhaps some improvement there is no drainage. 11-07-2018 patient seen today for follow-up and management of left elbow wound. She has completed a total of 11 treatments of the graffix with  not much improvement. She has an upcoming appointment with plastic surgery to assist with additional  treatment options for the left elbow wound on 11/19/18. There is significant amount surrounding undermining of the wound is 0.9 cm. Currently prescribed methotrexate. Wound is being treated with Indoform and border dressing. No drainage from wound. No fever, chills. or pain. 11/21/18; the patient continues to have the wound looking roughly the same with undermining from about 12 to 6:00. This has not changed all that much. She does have skin irritation around the wound that looks like drainage maceration issues. The patient states that she was not able to have her wound dressing changed because of illness in the person he usually does this. She also did not attend her clinic appointment today with Dr. Marina Goodell because of transportation issues. She is rebooked for some time in mid January 11/28/2018; the patient has less undermining using endoform. As a understandings she saw Dr. Thad Ranger who is Dr. Lonni Fix partner. He recommended putting her in a elbow brace and I believe is written a prescription for it. He also recommended Motrin 800 mg 3 times daily. This is prescription strength ibuprofen although he did not write his prescription. This apparently was for 2 weeks. Culture I did last time grew a few methicillin sensitive staph aureus. After some difficulty due to drug intolerances/allergies and drug interactions I settled on a 5-day course of azithromycin DIANDRA, CIMINI ([id number]) 12/03/18 on evaluation today patient actually appears to be doing fairly well in regard to her elbow when compared to last time I evaluated her. With that being said there does not appear to be any signs of infection at this time. That was the big concern currently as far as the patient was concerned. Nonetheless I do feel like she is making progress in regard to the feeling of this ulcer it has been slow. She did see a Engineer, petroleum they are talking about putting her in a brace in order to allow this area to  heal more appropriately. 12/19/2018; not much change in this from the last time I have saw this.'s much smaller area than when she first came in and with less circumferential undermining however this is never really adhered. She is wearing the brace that was given or prescribed to her by plastics. She did not have a procedure offered to attempt to close this. We have been using endoform 1/15; wound actually is not doing as well as last week. She was actually not supposed to come into this clinic again until next week but apparently her attendant noticed some redness increasing pain and she came in early. She reports the same amount of drainage. We have been using endoform. She is approved through puraply however I will only consider starting that next week 1/22; she completed the antibiotics last week. Culture I did was negative. In spite of this there is less erythema and pain complaints in the wound. Puraply #1 applied today 1/29; Puraply #2 today. Wound surface looks a lot better post debridement of adherent fibrinous material. However undermining from 6-12 is measuring worse 2/5; Puraply #3. Using her elbow brace 2/12 puraply #4 2/19 puraply #5. The 9:00 undermining measured at 0.5 cm. Undermining from 4-11 o'clock. Surface of the wound looks better and the circumference of the wound is smaller however the undermining is not really changed 2/26; still not much improvement. She has undermining from 4-9 o'clock 0.9 cm. Surface of the wound covered and adherent debris. 3/4; still no improvement.  Undermining from 4-9 o'clock still around a centimeter. Surface of the wound looks somewhat better. No debridement is required we used endoform after we ended the trial of puraply last week 3/11; really no improvement at all. Still 1 cm undermining from roughly 9-3 o'clock. This is about a centimeter. The base of the wound looks fairly healthy. No debridement. We have been using endoform. I am really out  of most usual options here. I could consider either another round of an amniotic advanced treatment product example epifix or perhaps regranex. Understandably the patient is a bit frustrated. We did send her to plastic surgery for a consult. Other than prescribing her a brace to immobilize the elbow they did not think she was a candidate for any further surgery. Notable that the patient is not using the brace today 3/18-Patient returns for attention to the left elbow area which apparently looked red at the home health visit. Patient's elbow looks the same if not better compared with last visit. The area of ulceration remains the same, the base appears healthy. We are continuing to use endoform she has been encouraged to use the brace to keep the elbow straight 3/25; the patient has an appointment at the [facility] wound care center on 4/2. I had actually put her out indefinitely however she seems to want to come back here every week. This week she complains of increased pain and malodor. Dimensions of the elbow wound are larger. We have been using endoform 4/8; the patient went to [facility] where they apparently gave her meta honey and some border foam with a Tubigrip. She has been using this for a week. She says she was very impressed with them there. They did not offer her any surgical consultation. She seems to be coming back here for a second opinion on this, she does not wish to drive to [facility] every week 1/61; still using Medihoney foam border and a Tubigrip. Actually do not think she has anything on the arm at all specifically she is not using her brace 5/6; she has been using medihoney without a lot of improvement. Undermining maximum at 9:00 at 0.5 cm may be somewhat better. She is still complaining of discomfort. She uses her elbow brace at night but is not using anything on the arm during the day, she finds it too restrictive 5/13; we switch the patient to endoform AG last week. She is complaining  of more pain and worried about some circumferential erythema around the wound. I had planned to consider epifix in this wound however the patient came in with a request to see a plastic surgeon in [place] by the name of Dr. Wyline Mood who cared for a friend of hers. Noteworthy that I have already sent her to one plastic surgeon and she went for another second opinion at [facility] and they apparently did not send her to a plastic surgeon nevertheless the patient is fairly convinced that she might benefit from a skin graft which I am doubtful. She also has rheumatoid arthritis and is on methotrexate. This was originally a surgical wound for a bursectomy a year or 2 ago Readmission: 06/28/19 on evaluation today patient presents today for reevaluation but this is due to a new issue her elbow has completely healed and looks excellent. Her right anterior lower leg has a skin tear which was sustained from her dog who jumped up on her and inadvertently scratch the area causing the skin tear. This happened yesterday. She is having some discomfort but  fortunately nothing too significant which is good news. No fevers, chills, nausea, or vomiting noted at this time. The skin fortunately was knocked one completely off and we are gonna see about we approximate in the skin as best we can in using Steri-Strips to hold this in place obviously if we can get some of this to reattach that would be beneficial EMILLIE, CHASEN. ([id number]) 07/05/2019 on evaluation today patient actually appears to be doing okay although unfortunately the skin flap that we were attempting to Steri-Stripped down last week did not take. She is developing a lot of fluid underneath the wound area unfortunately which again is not ideal. I think that this necrotic tissue needs to be removed and again it actually just wiped off during the evaluation today as I was attempting to clean the wound there did not appear to be any significant  issues underlying which is good although there was some purulent drainage I did want to go ahead and see about obtaining a culture from today in order to ensure that the Z-Pak that I placed her on earlier in the week was appropriate for treating what ever infection may be causing the issue currently. This was a deep wound culture obtained today. 07/12/2019 on evaluation today patient actually appears to be doing quite well with regard to her right lower extremity ulcer all things considering. There is still little bit of hematoma not it that is noted in the central portion of the wound along with some necrotic tissue but she is still having quite a bit of discomfort. For that reason I did not perform sharp debridement today although this wound does need some debridement of one type or another. I think we may attempt Iodoflex to see if this can be of benefit. Fortunately there is no signs of active infection at this point. 07/18/19 upon evaluation today patient appears to be doing better with regard to her ulcer on her right lower extremity. She's been tolerating the dressing changes without complication. The good news is she seems to be making excellent progress. Overall I'm pleased with there being no signs of infection. With that being said she does tell me that she's been having some discomfort with the wrap she's unsure of exactly what about the rafters causing her trouble. 07/25/2019 on evaluation today patient appears to be doing a little better in regard to her lower extremity ulcer. Unfortunately the alginate seems to be getting really stuck to the wound bed and surrounding periwound. Fortunately there is no signs of active infection at this time. There does not appear to be any evidence of infection currently. 08/01/2019 on evaluation today patient actually appears to be doing much better with regard to her right lower extremity ulcer. She has been tolerating the dressing changes without  complication. Fortunately her wound does not show any signs of infection and seems to be making good progress. No fevers, chills, nausea, vomiting, or diarrhea. 08/08/2019 on evaluation today patient actually appears to be doing better with regard to her leg ulcer. She has been tolerating the dressing changes without complication. With that being said I think we may want to switch the dressing up today just based on the appearance of the wound bed in general. Fortunately there is no evidence of infection currently. Her pain seems to be much better. Her son is present with her today. 08/15/2019 on evaluation today patient appears to be doing very well with regard to her right lower extremity ulcer. She still complains  about feeling like the compression stocking/Tubigrip is too tight for her. Nonetheless I still think this is beneficial and is helping the wound to heal more effectively. Overall I am extremely pleased at this time with what I am seeing. 08/22/2019 on evaluation today patient actually appears to be doing quite well with regard to her right lower extremity. She has been tolerating the dressing changes without complication. Fortunately there is no signs of active infection at this time. She still complains about the Tubigrip but nonetheless I think this is something that is of utmost importance for her to continue to wear if she is going to see this area heal appropriately. 08/29/2019 on evaluation today patient actually appears to be doing well visually in regard to her wound. Unfortunately in regard to overall pain she seems to be having more pain today which I am somewhat concerned about. There does not appear to be any signs of active infection that I can tell but again with increased pain that is definitely a concern here today. I do think it may be time to switch up the dressing as well to something that will be a little bit more effective hopefully and new tissue growth she has done  well with collagen in the past. 09/05/2019 on evaluation today patient actually appears to be doing well with regard to her leg ulcer. She has been tolerating the dressing changes without complication. Fortunately there is no signs of active infection. We did obtain a wound culture last week due to the fact that she still is very touchy as far as the surface of the wound is concerned. Unfortunately I am not sure what happened to that culture as there is no record in the computer system. Nonetheless we need to follow-up on this and try to see if we can identify what exactly happened with this specimen as we have not gotten a result back either obviously. 09/12/2019 on evaluation today patient actually appears to be showing signs of good improvement upon evaluation today. She states her pain is not nearly as bad as what it was which is also good news. She did have a wound culture which was reviewed today that showed no growth of bacteria which is good news. This coupled with the fact that her wound appears to be healing better, measuring smaller, and overall even the slough buildup is minimal compared to what we have seen previously, I feel like she is actually showing signs of excellent improvement today. GEORJEAN, TOYA ([id number]) 09/19/2019 on evaluation today patient appears to be doing well with regard to her wounds on evaluation today. She is showing signs of continued improvement week by week and everything is measuring significantly smaller which is great news. Overall very pleased with the progress that she has made. 09/26/2019 on evaluation today patient appears to be doing fairly well with regard to her right lower extremity ulcer. She does continue to have some drainage and again the smaller of the 2 wounds is not making as much progress as I like to see. We have been doing the silver collagen for some time I think we may want to switch to [facility] she has a little bit of  hyper granular tissue this may help to keep things from becoming too dramatic in that regard. Fortunately there is no signs of infection at this time. 10/03/2019 on evaluation today patient appears to be doing fairly well with regard to her lower extremity ulcers at this time. She has been tolerating  the dressing changes without complication. Fortunately the wound is measuring smaller which is great news she continues to make great progress. 10/17/2019 on evaluation today patient appears to be doing well with regard to her wounds of her right lower extremity. She states she has been having some increased pain apparently we gave her a little bit smaller size Tubigrip last time she was here this may have been what was causing some issues. She is out of the Tubigrip that we had ordered for her and therefore did not have any of the smaller size. Fortunately there is no evidence of active infection at this time. No fever chills noted 10/24/2019 upon evaluation today patient's wounds actually appear to be showing signs of good improvement which is excellent news. She has been tolerating the dressing changes without complication and overall I am rather pleased at this point with how things seem to be progressing. The patient likewise is very happy. She still was not happy with having to use the Tubigrip she states that bunches up in her shoe. 10/31/2019 on evaluation today patient actually appears to be doing quite well with regard to her leg ulcer. She has been tolerating the dressing changes without complication. Fortunately there is no signs of active infection at this time. Overall I am very pleased with the progress that she is made. She does wonder if we could switch to the bordered adhesive Hydrofera Blue I think that something we can definitely look into for her. 11/15/2019 patient on evaluation today appears to be making excellent progress. Her wound is significantly smaller and the wound bed  appears to be dramatically improved compared to even the last visit. Fortunately she is having really little to no pain at this time. No fevers, chills, nausea, vomiting, or diarrhea. 11/25/2019 on evaluation today patient appears to be doing well with regard to her lower extremity ulcer. She has been tolerating dressing changes without complication. Fortunately there is no signs of active infection at this time. No fevers, chills, nausea, vomiting, or diarrhea. 12/02/2019 on evaluation today patient unfortunately is not doing quite as well as what we would have liked to have seen based on last week's evaluation. Fortunately there is no signs of active infection at this time. With that being said she is continuing to experience some issues with hyper granulation she was doing much better with the [facility] prior to putting the contact layer in between her skin and the [facility] I think that we need to go back to that. She just can have to be very careful with removing the dressing in order to ensure it does not pull skin and get stuck. 12/16/2019 on evaluation today patient appears to be doing better her wound is measuring significantly smaller at this time compared to where things were previous. Fortunately there is no evidence of active infection at this point which is also good news. No fever chills noted. 12/23/2019 upon evaluation today patient appears to be doing excellent in regard to her lower extremity ulcer. She has been tolerating the dressing changes without complication. Fortunately there is no signs of active infection at this time. Overall I am very pleased with the fact that she is doing so well today. SIRENA, KISSELBURG ([id number]) Objective Constitutional Well-nourished and well-hydrated in no acute distress. Vitals Time Taken: 1:00 PM, Height: 60 in, Weight: 125 lbs, BMI: 24.4, Temperature: 97.7 F, Pulse: 68 bpm, Respiratory Rate: 16 breaths/min, Blood Pressure: 151/53  mmHg. Respiratory normal breathing without difficulty. Psychiatric  this patient is able to make decisions and demonstrates good insight into disease process. Alert and Oriented x 3. pleasant and cooperative. General Notes: Patient's wound bed again showed signs of complete epithelization there does not appear to be any active infection no open wounds at this time. Overall very pleased with how things have progressed. Integumentary (Hair, Skin) Wound #11 status is Healed - Epithelialized. Original cause of wound was Trauma. The wound is located on the Right,Anterior Lower Leg. The wound measures 0cm length x 0cm width x 0cm depth; 0cm^2 area and 0cm^3 volume. There is no tunneling or undermining noted. Assessment Active Problems ICD-10 Unspecified open wound, right lower leg, initial encounter Non-pressure chronic ulcer of other part of right lower leg with fat layer exposed Struck by dog, initial encounter Plan Discharge From [facility] Services: Discharge from Wound Care Center - treatment complete 1. My main concern at this point is that she continues to have injuries from her dog wanting to jump up on her. In fact she has a bruise on her leg that appears to have occurred just in the past few days and again this was due to her dog getting up on her leg. Nonetheless I am concerned about the fact that if she continues to have issues like this occur that she could end up with an even more severe wound and we had to do with her this point. I recommended that she needs to wear something like a derma saver of which have actually recommended before she in fact did order 1. She just was not very happy with wearing it. Nonetheless I think something like that is what she really needs to do in order to keep things under control. This will help protect her legs from her dog jumping up on her and causing skin tears or the like. MADALYNNE, GUTMANN ([id number]) 2. The patient also needs to continue to wear her  compression stockings in order to keep the edema under control. I think this is of utmost importance. If she were to wear the derma saver she would wear the stocking first and then the derma saver over top of this. We will see the patient back for follow-up visit as needed. Electronic Signature(s) Signed: 12/23/2019 1:25:25 PM By: Lenda Kelp PA-C Entered By: Lenda Kelp on 12/23/2019 13:25:25 [name] ([id number]) -------------------------------------------------------------------------------- SuperBill Details Patient Name: [name] Date of Service: 12/23/2019 Medical Record Number: [id number] Patient Account Number: [id number] Date of Birth/Sex: [birth year] (86 y.o. F) Treating RN: Huel Coventry Primary Care Provider: Aram Beecham Other Clinician: Referring Provider: Aram Beecham Treating Provider/Extender: Linwood Dibbles, Danuel Felicetti Weeks in Treatment: 25 Diagnosis Coding ICD-10 Codes Code Description S81.801A Unspecified open wound, right lower leg, initial encounter L97.812 Non-pressure chronic ulcer of other part of right lower leg with fat layer exposed W54.1XXA Struck by dog, initial encounter Facility Procedures CPT4 Code: [id number] Description: [phone] - WOUND CARE VISIT-LEV 2 EST PT Modifier: Quantity: 1 Physician Procedures CPT4 Code Description: [id number] - WC PHYS LEVEL 3 - EST PT ICD-10 Diagnosis Description S81.801A Unspecified open wound, right lower leg, initial encounter L97.812 Non-pressure chronic ulcer of other part of right lower leg wi W54.1XXA Struck by  dog, initial encounter Modifier: th fat layer expo Quantity: 1 sed Electronic Signature(s) Signed: 12/23/2019 1:25:38 PM By: Lenda Kelp PA-C Entered By: Lenda Kelp on 12/23/2019 13:25:37

## 2019-12-30 ENCOUNTER — Encounter: Payer: Medicare Other | Admitting: Physician Assistant

## 2020-03-31 ENCOUNTER — Emergency Department: Payer: Medicare Other

## 2020-03-31 ENCOUNTER — Encounter: Payer: Self-pay | Admitting: Emergency Medicine

## 2020-03-31 ENCOUNTER — Other Ambulatory Visit: Payer: Self-pay

## 2020-03-31 DIAGNOSIS — Z950 Presence of cardiac pacemaker: Secondary | ICD-10-CM | POA: Diagnosis not present

## 2020-03-31 DIAGNOSIS — W19XXXA Unspecified fall, initial encounter: Secondary | ICD-10-CM | POA: Diagnosis not present

## 2020-03-31 DIAGNOSIS — S0101XA Laceration without foreign body of scalp, initial encounter: Secondary | ICD-10-CM | POA: Insufficient documentation

## 2020-03-31 DIAGNOSIS — S50312A Abrasion of left elbow, initial encounter: Secondary | ICD-10-CM | POA: Insufficient documentation

## 2020-03-31 DIAGNOSIS — Z79899 Other long term (current) drug therapy: Secondary | ICD-10-CM | POA: Insufficient documentation

## 2020-03-31 DIAGNOSIS — Y998 Other external cause status: Secondary | ICD-10-CM | POA: Insufficient documentation

## 2020-03-31 DIAGNOSIS — Z96652 Presence of left artificial knee joint: Secondary | ICD-10-CM | POA: Insufficient documentation

## 2020-03-31 DIAGNOSIS — Y92010 Kitchen of single-family (private) house as the place of occurrence of the external cause: Secondary | ICD-10-CM | POA: Diagnosis not present

## 2020-03-31 DIAGNOSIS — S0990XA Unspecified injury of head, initial encounter: Secondary | ICD-10-CM | POA: Diagnosis present

## 2020-03-31 DIAGNOSIS — Y9389 Activity, other specified: Secondary | ICD-10-CM | POA: Insufficient documentation

## 2020-03-31 DIAGNOSIS — I10 Essential (primary) hypertension: Secondary | ICD-10-CM | POA: Diagnosis not present

## 2020-03-31 DIAGNOSIS — J449 Chronic obstructive pulmonary disease, unspecified: Secondary | ICD-10-CM | POA: Insufficient documentation

## 2020-03-31 NOTE — ED Triage Notes (Signed)
Pt to triage via w/c with no distress noted, mask in place; pt reports hx neuropathy causing frequent falls; at 830pm, fell in kitchen hitting back of head and left elbow on brick; denies LOC; lac noted to occipitut with no active bleeding

## 2020-04-01 ENCOUNTER — Emergency Department
Admission: EM | Admit: 2020-04-01 | Discharge: 2020-04-01 | Disposition: A | Discharge status: Home / Self Care | Payer: Medicare Other | Attending: Emergency Medicine | Admitting: Emergency Medicine

## 2020-04-01 DIAGNOSIS — S0101XA Laceration without foreign body of scalp, initial encounter: Secondary | ICD-10-CM

## 2020-04-01 DIAGNOSIS — S50312A Abrasion of left elbow, initial encounter: Secondary | ICD-10-CM

## 2020-04-01 DIAGNOSIS — W19XXXA Unspecified fall, initial encounter: Secondary | ICD-10-CM

## 2020-04-01 HISTORY — DX: Polyneuropathy, unspecified: G62.9

## 2020-04-01 NOTE — ED Provider Notes (Signed)
[facility] Emergency Department Provider Note  ____________________________________________  Time seen: Approximately 3:10 AM  I have reviewed the triage vital signs and the nursing notes.   HISTORY  Chief Complaint Laceration   HPI [name] is a 86 y.o. female   with a history as listed below not on anticoagulation who presents for evaluation of a mechanical fall.  Patient reports that she has neuropathy and therefore falls very often.  At 8:30 PM she was walking a friend out the door when she lost her balance and fell.  She sustained lacerations to the back of her head and a skin abrasion to the left elbow.  She denies any significant pain and reports mild soreness of her head.  She declines Tylenol for pain.  Tetanus shot is up-to-date.  She denies neck pain or back pain, lower extremity pain, chest pain or shortness of breath, abdominal pain.  She reports that the fall was mechanical in nature.  Past Medical History:  Diagnosis Date  . Anemia   . GERD (gastroesophageal reflux disease)   . Hypertension   . Neuropathy   . Peripheral vascular disease (HCC)    possible neuropathies in lower extremeties  . PONV (postoperative nausea and vomiting)    happens sometimes but better with pre med of zofran  . Presence of permanent cardiac pacemaker   . Syncope     Patient Active Problem List   Diagnosis Date Noted  . Septic olecranon bursitis of left elbow 03/27/2018  . Lymphedema 10/11/2017  . Chronic venous insufficiency 10/11/2017  . Leg pain 10/11/2017  . Leg swelling 10/11/2017  . COPD (chronic obstructive pulmonary disease) ([place]) 10/11/2017  . Essential hypertension 10/11/2017    Past Surgical History:  Procedure Laterality Date  . ABDOMINAL HYSTERECTOMY  1969  . APPENDECTOMY  1969   with hysterectomy  . BACK SURGERY  2001   rods in back. surgery on back x 3  . COLONOSCOPY    . I & D EXTREMITY Left 03/27/2018   Procedure: IRRIGATION  AND DEBRIDEMENT LEFT ELBOW / OLECRANON BURSA;  Surgeon: Corky Mull, MD;  Location: [facility] ORS;  Service: Orthopedics;  Laterality: Left;  . I & D EXTREMITY Left 06/05/2018   Procedure: IRRIGATION AND DEBRIDEMENT EXTREMITY;  Surgeon: Corky Mull, MD;  Location: [facility] ORS;  Service: Orthopedics;  Laterality: Left;  . INSERT / REPLACE / REMOVE PACEMAKER  2004  . JOINT REPLACEMENT Left 2004   partial knee replacement  . OLECRANON BURSECTOMY Left 03/27/2018   Procedure: LEFT OLECRANON BURSA;  Surgeon: Corky Mull, MD;  Location: [facility] ORS;  Service: Orthopedics;  Laterality: Left;  . PACEMAKER INSERTION  2014   dual chamber for complete heart block    Prior to Admission medications   Medication Sig Start Date End Date Taking? Authorizing Provider  acetaminophen (TYLENOL) 650 MG CR tablet Take 1,300 mg by mouth 2 (two) times daily.    [provider]  Ascorbic Acid (VITAMIN C) 1000 MG tablet Take 1,000 mg by mouth daily.    [provider]  Azelaic Acid (FINACEA) 15 % cream Apply 1 application topically every other day. (alternates between Retin-A)    [provider]  Calcium Carbonate-Vitamin D (CALCIUM-D) 600-400 MG-UNIT TABS Take 1 tablet by mouth 2 (two) times daily.    [provider]  Calcium Polycarbophil (FIBER-CAPS PO) Take 1 capsule by mouth at bedtime.     [provider]  Cholecalciferol (VITAMIN D-3) 5000  units TABS Take 5,000 Units by mouth daily.    [provider]  cyanocobalamin (,VITAMIN B-12,) 1000 MCG/ML injection Inject 1,000 mcg into the muscle every 30 (thirty) days.    [provider]  diclofenac sodium (VOLTAREN) 1 % GEL Apply 2-4 g topically 4 (four) times daily as needed (for back pain.).    [provider]  estradiol (ESTRACE) 0.1 MG/GM vaginal cream Place 1 Applicatorful vaginally at bedtime as needed (for vaginal discomfort).    [provider]  folic acid (FOLVITE) 1 MG tablet Take 2 mg  by mouth daily.    [provider]  hydroxychloroquine (PLAQUENIL) 200 MG tablet Take 200 mg by mouth daily.    [provider]  methotrexate 50 MG/2ML injection Inject 25 mg into the muscle every Saturday. On hold until after surgery 08/21/17   [provider]  Misc Natural Products (CALMME PO) Take 5 mLs by mouth at bedtime. Magnesium 1 tsp at night (dissolved in 4 oz. Water)    [provider]  Multiple Vitamin (MULTIVITAMIN) tablet Take 1 tablet by mouth daily.    [provider]  nebivolol (BYSTOLIC) 5 MG tablet Take 5 mg by mouth daily.    [provider]  Omega-3 Fatty Acids (FISH OIL) 1000 MG CAPS Take 1,000 mg by mouth 2 (two) times daily.    [provider]  omeprazole (PRILOSEC) 40 MG capsule Take 40 mg by mouth daily before breakfast.     [provider]  ondansetron (ZOFRAN) 4 MG tablet Take 4 mg by mouth every 8 (eight) hours as needed for nausea or vomiting.    [provider]  Probiotic Product (PHILLIPS COLON HEALTH PO) Take 1 tablet by mouth daily.    [provider]  traMADol (ULTRAM) 50 MG tablet Take 1 tablet (50 mg total) by mouth every 6 (six) hours as needed for moderate pain. 06/05/18   Poggi, Excell Seltzer, MD  tretinoin (RETIN-A) 0.05 % cream Apply 1 application topically every other day. (alternate between finacea)    [provider]  vitamin B-12 (CYANOCOBALAMIN) 1000 MCG tablet Take 1,000 mcg by mouth daily.    [provider]    Allergies Codeine, Doxycycline, Morphine, Tramadol, Adhesive [tape], Amitriptyline, Ciprofloxacin, Clarithromycin, Erythromycin, Itraconazole, Nsaids, Penicillins, and Zoloft [sertraline hcl]  Family History  Problem Relation Age of Onset  . Breast cancer Mother 74    Social History Social History   Tobacco Use  . Smoking status: Never Smoker  . Smokeless tobacco: Never Used  Substance Use Topics  . Alcohol use: Yes    Comment:  occasional glass of wine  . Drug use: Never    Review of Systems  Constitutional: Negative for fever. Eyes: Negative for visual changes. ENT: Negative for facial injury or neck injury Cardiovascular: Negative for chest injury. Respiratory: Negative for shortness of breath. Negative for chest wall injury. Gastrointestinal: Negative for abdominal pain or injury. Genitourinary: Negative for dysuria. Musculoskeletal: Negative for back injury, negative for arm or leg pain. Skin: + L elbow abrasion and scalp laceration Neurological: + head injury.   ____________________________________________   PHYSICAL EXAM:  VITAL SIGNS: ED Triage Vitals  Enc Vitals Group     BP 03/31/20 2213 (!) 151/76     Pulse Rate 03/31/20 2213 87     Resp 03/31/20 2213 18     Temp 03/31/20 2213 97.8 F (36.6 C)     Temp Source 03/31/20 2213 Oral     SpO2 03/31/20  2213 98 %     Weight 03/31/20 2214 125 lb (56.7 kg)     Height 03/31/20 2214 5' (1.524 m)     Head Circumference --      Peak Flow --      Pain Score 03/31/20 2228 3     Pain Loc --      Pain Edu? --      Excl. in GC? --     Full spinal precautions maintained throughout the trauma exam. Constitutional: Alert and oriented. No acute distress. Does not appear intoxicated. HEENT Head: Normocephalic with 3cm scalp laceration Face: No facial bony tenderness. Stable midface Ears: No hemotympanum bilaterally. No Barnett sign Eyes: No eye injury. PERRL. No raccoon eyes Nose: Nontender. No epistaxis. No rhinorrhea Mouth/Throat: Mucous membranes are moist. No oropharyngeal blood. No dental injury. Airway patent without stridor. Normal voice. Neck: no C-collar. No midline c-spine tenderness.  Cardiovascular: Normal rate, regular rhythm. Normal and symmetric distal pulses are present in all extremities. Pulmonary/Chest: Chest wall is stable and nontender to palpation/compression. Normal respiratory effort. Breath sounds are normal. No crepitus.    Abdominal: Soft, nontender, non distended. Musculoskeletal: Nontender with normal full range of motion in all extremities. No deformities. No thoracic or lumbar midline spinal tenderness. Pelvis is stable. Skin: Skin is warm, dry and intact. L elbow abrasion Psychiatric: Speech and behavior are appropriate. Neurological: Normal speech and language. Moves all extremities to command. No gross focal neurologic deficits are appreciated.  Glascow Coma Score: 4 - Opens eyes on own 6 - Follows simple motor commands 5 - Alert and oriented GCS: 15   ____________________________________________   LABS (all labs ordered are listed, but only abnormal results are displayed)  Labs Reviewed - No data to display ____________________________________________  EKG  none  ____________________________________________  RADIOLOGY  I have personally reviewed the images performed during this visit and I agree with the Radiologist's read.   Interpretation by Radiologist:  DG Elbow Complete Left  Result Date: 03/31/2020 CLINICAL DATA:  86 year old female with fall and trauma to the left elbow. EXAM: LEFT ELBOW - COMPLETE 3+ VIEW COMPARISON:  Left elbow radiograph dated 07/04/2018. FINDINGS: There is no acute fracture or dislocation. The bones are osteopenic. Mild degenerative changes. Probable mild elevation of the anterior fat pad which may represent mild joint effusion. This is similar to prior radiograph. The soft tissues are unremarkable. IMPRESSION: No acute fracture or dislocation. Electronically Signed   By: Elgie Collard M.D.   On: 03/31/2020 23:08   CT Head Wo Contrast  Result Date: 03/31/2020 CLINICAL DATA:  Headache after fall occipital laceration EXAM: CT HEAD WITHOUT CONTRAST TECHNIQUE: Contiguous axial images were obtained from the base of the skull through the vertex without intravenous contrast. COMPARISON:  04/03/2018 FINDINGS: Brain: No acute territorial infarction, hemorrhage or  intracranial mass. Atrophy. Moderate hypodensity in the white matter consistent with chronic small vessel ischemic change. Stable prominent ventricle size. Vascular: No hyperdense vessels.  Carotid vascular calcification Skull: Normal. Negative for fracture or focal lesion. Sinuses/Orbits: Opacified left sphenoid sinus. Other: Right posterior scalp laceration and hematoma IMPRESSION: 1. No CT evidence for acute intracranial abnormality. 2. Atrophy and chronic small vessel ischemic change of the white matter Electronically Signed   By: [name] Pang M.D.   On: 03/31/2020 23:05   CT Cervical Spine Wo Contrast  Result Date: 03/31/2020 CLINICAL DATA:  Fall EXAM: CT CERVICAL SPINE WITHOUT CONTRAST TECHNIQUE: Multidetector CT imaging of the cervical spine was performed without intravenous contrast. Multiplanar  CT image reconstructions were also generated. COMPARISON:  01/27/2017 FINDINGS: Alignment: 5 mm anterolisthesis C2 on C3, chronic. Trace anterolisthesis C7 on T1. Facet alignment is maintained. Skull base and vertebrae: No acute fracture. No primary bone lesion or focal pathologic process. Soft tissues and spinal canal: No prevertebral fluid or swelling. No visible canal hematoma. Disc levels: Post fusion hardware C4 through C6. fused appearance at C3-C4. Advanced degenerative change C6-C7. Advanced degenerative changes C2-C3. Facet degenerative changes at multiple levels. Upper chest: Negative. Other: None IMPRESSION: 1. No acute fracture identified. 2. Post fusion hardware C4 through C6. Chronic 5 mm anterolisthesis C2 on C3 Electronically Signed   By: [name] Pang M.D.   On: 03/31/2020 23:12     ____________________________________________   PROCEDURES  Procedure(s) performed:yes .Marland KitchenLaceration Repair  Date/Time: 04/01/2020 3:13 AM Performed by: Nita Sickle, MD Authorized by: Nita Sickle, MD   Consent:    Consent obtained:  Verbal   Consent given by:  Patient   Risks discussed:   Infection, pain, retained foreign body, poor cosmetic result and poor wound healing Anesthesia (see MAR for exact dosages):    Anesthesia method:  None Laceration details:    Location:  Scalp   Scalp location:  R parietal   Length (cm):  3 Repair type:    Repair type:  Simple Pre-procedure details:    Preparation:  Patient was prepped and draped in usual sterile fashion and imaging obtained to evaluate for foreign bodies Exploration:    Hemostasis achieved with:  Direct pressure   Wound exploration: entire depth of wound probed and visualized     Wound extent: no fascia violation noted, no foreign bodies/material noted, no underlying fracture noted and no vascular damage noted     Contaminated: no   Treatment:    Area cleansed with:  Saline and Betadine   Amount of cleaning:  Extensive   Irrigation solution:  Sterile saline   Visualized foreign bodies/material removed: no   Skin repair:    Repair method:  Staples   Number of staples:  2 Approximation:    Approximation:  Close Post-procedure details:    Dressing:  Open (no dressing)   Patient tolerance of procedure:  Tolerated well, no immediate complications   Critical Care performed:  None ____________________________________________   INITIAL IMPRESSION / ASSESSMENT AND PLAN / ED COURSE  86 y.o. female   with a history as listed below not on anticoagulation who presents for evaluation of a mechanical fall.  Patient sustained scalp laceration which was repaired per procedure note above.  Wound care discussed with patient.  History gathered from patient and son who was in the room.  Fall was mechanical in nature.  Elbow abrasion.  Wound was cleaned and dressed.  Wound care discussed with patient.  No other injuries.  CT head, cervical spine, and x-ray of the elbow were visualized and read by me as negative, confirmed by radiology.  Old medical records were reviewed.  Patient discharged to the care of his son.  Recommended  removing staples in 7 days.       ____________________________________________  Please note:  Patient was evaluated in Emergency Department today for the symptoms described in the history of present illness. Patient was evaluated in the context of the global COVID-19 pandemic, which necessitated consideration that the patient might be at risk for infection with the SARS-CoV-2 virus that causes COVID-19. Institutional protocols and algorithms that pertain to the evaluation of patients at risk for COVID-19 are in a state  of rapid change based on information released by regulatory bodies including the CDC and federal and state organizations. These policies and algorithms were followed during the patient's care in the ED.  Some ED evaluations and interventions may be delayed as a result of limited staffing during the pandemic.   ____________________________________________   FINAL CLINICAL IMPRESSION(S) / ED DIAGNOSES   Final diagnoses:  Fall, initial encounter  Laceration of scalp, initial encounter  Abrasion of left elbow, initial encounter      NEW MEDICATIONS STARTED DURING THIS VISIT:  ED Discharge Orders    None       Note:  This document was prepared using Dragon voice recognition software and may include unintentional dictation errors.    Don Perking, [place], MD 04/01/20 [phone]

## 2020-04-01 NOTE — ED Notes (Signed)
EDP at bedside  

## 2020-04-01 NOTE — Discharge Instructions (Addendum)
Keep laceration dry and clean. Wash with warm water and soap. Apply topical bacitracin. Protect from the sun to minimize scarring. Cover it with SPF 70 or higher and use hat when out in the sun for 6-9 months. You received 2 staples that must be removed in 7 days.  Watch for signs of infection: pus, redness of the skin surrounding it, or fever. If these develop see your doctor or return to the ER for antibiotics.  

## 2020-05-01 ENCOUNTER — Other Ambulatory Visit: Payer: Self-pay | Admitting: Internal Medicine

## 2020-05-01 DIAGNOSIS — M7989 Other specified soft tissue disorders: Secondary | ICD-10-CM

## 2020-05-07 ENCOUNTER — Other Ambulatory Visit: Payer: Self-pay

## 2020-05-07 ENCOUNTER — Ambulatory Visit
Admission: RE | Admit: 2020-05-07 | Discharge: 2020-05-07 | Disposition: A | Discharge status: Home / Self Care | Payer: Medicare Other | Source: Ambulatory Visit | Attending: Internal Medicine | Admitting: Internal Medicine

## 2020-05-07 DIAGNOSIS — M7989 Other specified soft tissue disorders: Secondary | ICD-10-CM

## 2020-05-08 ENCOUNTER — Ambulatory Visit: Payer: Medicare Other

## 2020-08-12 ENCOUNTER — Other Ambulatory Visit: Payer: Self-pay

## 2020-08-12 ENCOUNTER — Encounter: Payer: Medicare Other | Attending: Internal Medicine | Admitting: Internal Medicine

## 2020-08-12 DIAGNOSIS — M351 Other overlap syndromes: Secondary | ICD-10-CM | POA: Insufficient documentation

## 2020-08-12 DIAGNOSIS — S50812A Abrasion of left forearm, initial encounter: Secondary | ICD-10-CM | POA: Diagnosis not present

## 2020-08-12 DIAGNOSIS — G5602 Carpal tunnel syndrome, left upper limb: Secondary | ICD-10-CM | POA: Diagnosis not present

## 2020-08-12 DIAGNOSIS — M1990 Unspecified osteoarthritis, unspecified site: Secondary | ICD-10-CM | POA: Diagnosis not present

## 2020-08-12 DIAGNOSIS — Z950 Presence of cardiac pacemaker: Secondary | ICD-10-CM | POA: Insufficient documentation

## 2020-08-12 DIAGNOSIS — I10 Essential (primary) hypertension: Secondary | ICD-10-CM | POA: Diagnosis not present

## 2020-08-12 DIAGNOSIS — G5620 Lesion of ulnar nerve, unspecified upper limb: Secondary | ICD-10-CM | POA: Insufficient documentation

## 2020-08-12 DIAGNOSIS — W548XXA Other contact with dog, initial encounter: Secondary | ICD-10-CM | POA: Insufficient documentation

## 2020-08-12 DIAGNOSIS — M419 Scoliosis, unspecified: Secondary | ICD-10-CM | POA: Diagnosis not present

## 2020-08-12 DIAGNOSIS — M069 Rheumatoid arthritis, unspecified: Secondary | ICD-10-CM | POA: Insufficient documentation

## 2020-08-13 NOTE — Progress Notes (Signed)
[name], [name] ([id number]) Visit Report for 08/12/2020 Abuse/Suicide Risk Screen Details Patient Name: [name], [name] Date of Service: 08/12/2020 12:45 PM Medical Record Number: [id number] Patient Account Number: [id number] Date of Birth/Sex: September 18, 1930 (86 y.o. F) Treating RN: Tyler Aas Primary Care Tyger Wichman: Aram Beecham Other Clinician: Referring Avanelle Pixley: Referral, Self Treating Demitra Danley/Extender: Altamese [place] in Treatment: 0 Abuse/Suicide Risk Screen Items Answer ABUSE RISK SCREEN: Has anyone close to you tried to hurt or harm you recentlyo No Do you feel uncomfortable with anyone in your familyo No Has anyone forced you do things that you didnot want to doo No Electronic Signature(s) Signed: 08/12/2020 4:49:26 PM By: Tyler Aas Entered By: Tyler Aas on 08/12/2020 14:22:25 [name] ([id number]) -------------------------------------------------------------------------------- Activities of Daily Living Details Patient Name: [name] Date of Service: 08/12/2020 12:45 PM Medical Record Number: [id number] Patient Account Number: [id number] Date of Birth/Sex: Sep 23, 1930 (86 y.o. F) Treating RN: Tyler Aas Primary Care Kindall Swaby: Aram Beecham Other Clinician: Referring Onyekachi Gathright: Referral, Self Treating Armstead Heiland/Extender: Altamese [place] in Treatment: 0 Activities of Daily Living Items Answer Activities of Daily Living (Please select one for each item) Drive Automobile Completely Able Take Medications Completely Able Use Telephone Completely Able Care for Appearance Completely Able Use Toilet Completely Able Bath / Shower Completely Able Dress Self Completely Able Feed Self Completely Able Walk Completely Able Get In / Out Bed Completely Able Housework Completely Able Prepare Meals Completely Able Handle Money Need Assistance Shop for Self Completely Able Electronic Signature(s) Signed: 08/12/2020 4:49:26 PM By:  Tyler Aas Entered By: Tyler Aas on 08/12/2020 14:22:31 [name] ([id number]) -------------------------------------------------------------------------------- Education Screening Details Patient Name: [name] Date of Service: 08/12/2020 12:45 PM Medical Record Number: [id number] Patient Account Number: [id number] Date of Birth/Sex: 29-Sep-1930 (86 y.o. F) Treating RN: Tyler Aas Primary Care Rhianon Zabawa: Aram Beecham Other Clinician: Referring Cymone Yeske: Referral, Self Treating Festus Pursel/Extender: Altamese [place] in Treatment: 0 Learning Preferences/Education Level/Primary Language Learning Preference: Explanation, Demonstration, Printed Material Highest Education Level: High School Preferred Language: English Cognitive Barrier Language Barrier: No Translator Needed: No Memory Deficit: No Emotional Barrier: No Cultural/Religious Beliefs Affecting Medical Care: No Physical Barrier Impaired Vision: No Impaired Hearing: No Decreased Hand dexterity: No Knowledge/Comprehension Knowledge Level: High Comprehension Level: High Ability to understand written instructions: High Motivation Anxiety Level: Calm Cooperation: Cooperative Interest in Health Problems: Asks Questions Perception: Coherent Willingness to Engage in Self-Management High Activities: Readiness to Engage in Self-Management High Activities: Electronic Signature(s) Signed: 08/12/2020 4:49:26 PM By: Tyler Aas Entered By: Tyler Aas on 08/12/2020 14:22:37 [name] ([id number]) -------------------------------------------------------------------------------- Fall Risk Assessment Details Patient Name: [name] Date of Service: 08/12/2020 12:45 PM Medical Record Number: [id number] Patient Account Number: [id number] Date of Birth/Sex: 07-09-1930 (86 y.o. F) Treating RN: Tyler Aas Primary Care Dmauri Rosenow: Aram Beecham Other Clinician: Referring  Merary Garguilo: Referral, Self Treating Shanora Christensen/Extender: Altamese [place] in Treatment: 0 Fall Risk Assessment Items Have you had 2 or more falls in the last 12 monthso 0 No Have you had any fall that resulted in injury in the last 12 monthso 0 No FALLS RISK SCREEN History of falling - immediate or within 3 months 0 No Secondary diagnosis (Do you have 2 or more medical diagnoseso) 0 No Ambulatory aid None/bed rest/wheelchair/nurse 0 No Crutches/cane/walker 0 No Furniture 0 No Intravenous therapy Access/Saline/Heparin Lock 0 No Gait/Transferring Normal/ bed rest/ wheelchair 0 No Weak (short steps with or without shuffle, stooped but able to lift head while walking,  may 0 No seek support from furniture) Impaired (short steps with shuffle, may have difficulty arising from chair, head down, impaired 0 No balance) Mental Status Oriented to own ability 0 No Electronic Signature(s) Signed: 08/12/2020 4:49:26 PM By: Tyler Aas Entered By: Tyler Aas on 08/12/2020 14:22:43 [name] ([id number]) -------------------------------------------------------------------------------- Foot Assessment Details Patient Name: [name] Date of Service: 08/12/2020 12:45 PM Medical Record Number: [id number] Patient Account Number: [id number] Date of Birth/Sex: 09-Jan-1930 (86 y.o. F) Treating RN: Tyler Aas Primary Care Tanis Hensarling: Aram Beecham Other Clinician: Referring Klint Lezcano: Referral, Self Treating Devika Dragovich/Extender: Altamese [place] in Treatment: 0 Foot Assessment Items Site Locations + = Sensation present, - = Sensation absent, C = Callus, U = Ulcer R = Redness, W = Warmth, M = Maceration, PU = Pre-ulcerative lesion F = Fissure, S = Swelling, D = Dryness Assessment Right: Left: Other Deformity: No No Prior Foot Ulcer: No No Prior Amputation: No No Charcot Joint: No No Ambulatory Status: Gait: Electronic Signature(s) Signed: 08/12/2020 4:49:26 PM  By: Tyler Aas Entered By: Tyler Aas on 08/12/2020 14:22:52 [name] ([id number]) -------------------------------------------------------------------------------- Nutrition Risk Screening Details Patient Name: [name] Date of Service: 08/12/2020 12:45 PM Medical Record Number: [id number] Patient Account Number: [id number] Date of Birth/Sex: [birth year] (86 y.o. F) Treating RN: Tyler Aas Primary Care Santez Woodcox: Aram Beecham Other Clinician: Referring Idy Rawling: Referral, Self Treating Madalin Hughart/Extender: Altamese [place] in Treatment: 0 Height (in): 60 Weight (lbs): 125 Body Mass Index (BMI): 24.4 Nutrition Risk Screening Items Score Screening NUTRITION RISK SCREEN: I have an illness or condition that made me change the kind and/or amount of food I eat 0 No I eat fewer than two meals per day 0 No I eat few fruits and vegetables, or milk products 0 No I have three or more drinks of beer, liquor or wine almost every day 0 No I have tooth or mouth problems that make it hard for me to eat 0 No I don't always have enough money to buy the food I need 0 No I eat alone most of the time 1 Yes I take three or more different prescribed or over-the-counter drugs a day 1 Yes Without wanting to, I have lost or gained 10 pounds in the last six months 0 No I am not always physically able to shop, cook and/or feed myself 0 No Nutrition Protocols Good Risk Protocol 0 No interventions needed Moderate Risk Protocol High Risk Proctocol Risk Level: Good Risk Score: 2 Electronic Signature(s) Signed: 08/12/2020 4:49:26 PM By: Tyler Aas Entered By: Tyler Aas on 08/12/2020 14:22:48

## 2020-08-14 NOTE — Progress Notes (Signed)
TRENECIA, GAFFKE ([id number]) Visit Report for 08/12/2020 Allergy List Details Patient Name: [name]. Date of Service: 08/12/2020 12:45 PM Medical Record Number: [id number] Patient Account Number: [id number] Date of Birth/Sex: Apr 25, 1930 (86 y.o. F) Treating RN: Tyler Aas Primary Care Casin Federici: Aram Beecham Other Clinician: Referring Lianah Peed: Referral, Self Treating Maleeah Crossman/Extender: Maxwell Caul Weeks in Treatment: 0 Allergies Active Allergies Penicillins Reaction: rash Severity: Moderate Sporanox Reaction: elevated LFT Severity: Moderate adhesive Reaction: skin alergy Severity: Moderate codeine Reaction: n/v Severity: Moderate Cipro Reaction: diarrhea Severity: Moderate bioxin Reaction: upset stomach doxycycline Reaction: flu like systems azithromycin Reaction: upset stomach Severity: Mild Allergy Notes Electronic Signature(s) Signed: 08/12/2020 4:49:26 PM By: Tyler Aas Entered By: Tyler Aas on 08/12/2020 14:21:54 [name] ([id number]) -------------------------------------------------------------------------------- Arrival Information Details Patient Name: [name] Date of Service: 08/12/2020 12:45 PM Medical Record Number: [id number] Patient Account Number: [id number] Date of Birth/Sex: 05-Jun-1930 (86 y.o. F) Treating RN: Huel Coventry Primary Care Fenix Rorke: Aram Beecham Other Clinician: Referring Annagrace Carr: Referral, Self Treating Mikella Linsley/Extender: Altamese [place] in Treatment: 0 Visit Information Patient Arrived: Wheel Chair Arrival Time: 12:53 Accompanied By: friend Transfer Assistance: None Patient Identification Verified: Yes Secondary Verification Process Completed: Yes History Since Last Visit Added or deleted any medications: No Any new allergies or adverse reactions: No Had a fall or experienced change in activities of daily living that may affect risk of falls: No Signs or symptoms of  abuse/neglect since last visito No Hospitalized since last visit: No Implantable device outside of the clinic excluding cellular tissue based products placed in the center since last visit: No Electronic Signature(s) Signed: 08/12/2020 3:55:47 PM By: Dayton Martes RCP, RRT, CHT Entered By: Dayton Martes on 08/12/2020 12:57:19 [name] ([id number]) -------------------------------------------------------------------------------- Clinic Level of Care Assessment Details Patient Name: [name] Date of Service: 08/12/2020 12:45 PM Medical Record Number: [id number] Patient Account Number: [id number] Date of Birth/Sex: 01-Jul-1930 (86 y.o. F) Treating RN: Huel Coventry Primary Care Ava Deguire: Aram Beecham Other Clinician: Referring Haseeb Fiallos: Referral, Self Treating Trajan [place]/Extender: Altamese [place] in Treatment: 0 Clinic Level of Care Assessment Items TOOL 2 Quantity Score []  - Use when only an EandM is performed on the INITIAL visit 0 ASSESSMENTS - Nursing Assessment / Reassessment X - General Physical Exam (combine w/ comprehensive assessment (listed just below) when performed on new 1 20 pt. evals) X- 1 25 Comprehensive Assessment (HX, ROS, Risk Assessments, Wounds Hx, etc.) ASSESSMENTS - Wound and Skin Assessment / Reassessment X - Simple Wound Assessment / Reassessment - one wound 1 5 []  - 0 Complex Wound Assessment / Reassessment - multiple wounds []  - 0 Dermatologic / Skin Assessment (not related to wound area) ASSESSMENTS - Ostomy and/or Continence Assessment and Care []  - Incontinence Assessment and Management 0 []  - 0 Ostomy Care Assessment and Management (repouching, etc.) PROCESS - Coordination of Care X - Simple Patient / Family Education for ongoing care 1 15 []  - 0 Complex (extensive) Patient / Family Education for ongoing care X- 1 10 Staff obtains Chiropractor, Records, Test Results / Process Orders []  - 0 Staff telephones  HHA, Nursing Homes / Clarify orders / etc []  - 0 Routine Transfer to another Facility (non-emergent condition) []  - 0 Routine Hospital Admission (non-emergent condition) X- 1 15 New Admissions / Manufacturing engineer / Ordering NPWT, Apligraf, etc. []  - 0 Emergency Hospital Admission (emergent condition) X- 1 10 Simple Discharge Coordination []  - 0 Complex (extensive) Discharge Coordination PROCESS - Special Needs []  -  Pediatric / Minor Patient Management 0 []  - 0 Isolation Patient Management []  - 0 Hearing / Language / Visual special needs []  - 0 Assessment of Community assistance (transportation, D/C planning, etc.) []  - 0 Additional assistance / Altered mentation []  - 0 Support Surface(s) Assessment (bed, cushion, seat, etc.) INTERVENTIONS - Wound Cleansing / Measurement X - Wound Imaging (photographs - any number of wounds) 1 5 []  - 0 Wound Tracing (instead of photographs) X- 1 5 Simple Wound Measurement - one wound []  - 0 Complex Wound Measurement - multiple wounds [name], [name] ( ) X- 1 5 Simple Wound Cleansing - one wound []  - 0 Complex Wound Cleansing - multiple wounds INTERVENTIONS - Wound Dressings []  - Small Wound Dressing one or multiple wounds 0 X- 1 15 Medium Wound Dressing one or multiple wounds []  - 0 Large Wound Dressing one or multiple wounds []  - 0 Application of Medications - injection INTERVENTIONS - Miscellaneous []  - External ear exam 0 []  - 0 Specimen Collection (cultures, biopsies, blood, body fluids, etc.) []  - 0 Specimen(s) / Culture(s) sent or taken to Lab for analysis []  - 0 Patient Transfer (multiple staff / / Similar devices) []  - 0 Simple Staple / Suture removal (25 or less) []  - 0 Complex Staple / Suture removal (26 or more) []  - 0 Hypo / Hyperglycemic Management (close monitor of Blood Glucose) []  - 0 Ankle / Brachial Index (ABI) - do not check if billed separately Has the patient been seen at the  hospital within the last three years: Yes Total Score: 130 Level Of Care: New/Established - Level 4 Electronic Signature(s) Signed: 08/13/2020 6:11:40 PM By: , BSN, RN, CWS, Kim RN, BSN Entered By: , BSN, RN, CWS, Kim on 08/12/2020 13:34:45 [name] ([id number]) -------------------------------------------------------------------------------- Encounter Discharge Information Details Patient Name: Date of Service: 08/12/2020 12:45 PM Medical Record Number: Patient Account Number: Date of Birth/Sex: 1930/07/11 (86 y.o. F) Treating RN: Primary Care Sriyan Cutting: Other Clinician: Referring Viaan Knippenberg: Referral, Self Treating Javonnie Illescas/Extender: Nurse, adult in Treatment: 0 Encounter Discharge Information Items Discharge Condition: Stable Ambulatory Status: Walker Discharge Destination: Home Transportation: Private Auto Accompanied By: Schedule Follow-up Appointment: Yes Clinical Summary of Care: Electronic Signature(s) Signed: 08/13/2020 6:11:40 PM By: , BSN, RN, CWS, Kim RN, BSN Entered By: , BSN, RN, CWS, Kim on 08/12/2020 13:36:57 Elliot Gurney (Elliot Gurney) -------------------------------------------------------------------------------- Lower Extremity Assessment Details Patient Name: 10/12/2020 Date of Service: 08/12/2020 12:45 PM Medical Record Number: [id number] Patient Account Number: [name] Date of Birth/Sex: 04/24/1930 (86 y.o. F) Treating RN: [id number] Primary Care Jonay Hitchcock: 13/05/1930 Other Clinician: Referring Rika Daughdrill: Referral, Self Treating Dhrithi Riche/Extender: 06-28-1977 Weeks in Treatment: 0 Electronic Signature(s) Signed: 08/12/2020 4:49:26 PM By: Aram Beecham Entered By: Altamese [place] on 08/12/2020 13:15:17 10/13/2020 (Elliot Gurney) -------------------------------------------------------------------------------- Multi Wound Chart  Details Patient Name: Elliot Gurney Date of Service: 08/12/2020 12:45 PM Medical Record Number: [name] Patient Account Number: [id number] Date of Birth/Sex: 1930/08/01 (86 y.o. F) Treating RN: [id number] Primary Care Adama Ivins: [id number] Other Clinician: Referring Chord Takahashi: Referral, Self Treating Isael Stille/Extender: 13/05/1930 in Treatment: 0 Vital Signs Height(in): 60 Pulse(bpm): 69 Weight(lbs): 125 Blood Pressure(mmHg): 142/57 Body Mass Index(BMI): 24 Temperature(F): 97.9 Respiratory Rate(breaths/min): 16 Photos: [N/A:N/A] Wound Location: Left Forearm N/A N/A Wounding Event: Trauma N/A N/A Primary Etiology: Trauma, Other N/A N/A Comorbid History: Cataracts, Glaucoma, Optic N/A N/A Neuritis, Chronic sinus problems/congestion, Middle ear  problems, Hypertension, Rheumatoid Arthritis Date Acquired: 08/10/2020 N/A N/A Weeks of Treatment: 0 N/A N/A Wound Status: Open N/A N/A Measurements L x W x D (cm) 3x4.5x0.1 N/A N/A Area (cm) : 10.603 N/A N/A Volume (cm) : 1.06 N/A N/A Classification: Partial Thickness N/A N/A Exudate Amount: Medium N/A N/A Exudate Type: Serosanguineous N/A N/A Exudate Color: red, brown N/A N/A Wound Margin: Flat and Intact N/A N/A Granulation Amount: None Present (0%) N/A N/A Necrotic Amount: None Present (0%) N/A N/A Exposed Structures: Fat Layer (Subcutaneous Tissue): N/A N/A Yes Fascia: No Tendon: No Muscle: No Joint: No Bone: No Treatment Notes Wound #12 (Left Forearm) Notes Xeroform gauze, ABD, Conform, stretch net to secure. Electronic Signature(s) Signed: 08/13/2020 9:14:54 AM By: Baltazar Najjar MD [name] ([id number]) Entered By: Baltazar Najjar on 08/12/2020 13:39:06 [name] ([id number]) -------------------------------------------------------------------------------- Multi-Disciplinary Care Plan Details Patient Name: [name], [name] Date of Service: 08/12/2020 12:45 PM Medical Record Number:  [id number] Patient Account Number: [id number] Date of Birth/Sex: 1930/09/01 (86 y.o. F) Treating RN: Huel Coventry Primary Care Marra Fraga: Aram Beecham Other Clinician: Referring Linn Clavin: Referral, Self Treating Rashed Edler/Extender: Altamese [place] in Treatment: 0 Active Inactive Abuse / Safety / Falls / Self Care Management Nursing Diagnoses: History of Falls Goals: Patient/caregiver will verbalize understanding of skin care regimen Date Initiated: 08/12/2020 Target Resolution Date: 08/26/2020 Goal Status: Active Interventions: Assess fall risk on admission and as needed Notes: Orientation to the Wound Care Program Nursing Diagnoses: Knowledge deficit related to the wound healing center program Goals: Patient/caregiver will verbalize understanding of the Wound Healing Center Program Date Initiated: 08/12/2020 Target Resolution Date: 08/26/2020 Goal Status: Active Interventions: Provide education on orientation to the wound center Notes: Wound/Skin Impairment Nursing Diagnoses: Impaired tissue integrity Goals: Ulcer/skin breakdown will have a volume reduction of 30% by week 4 Date Initiated: 08/12/2020 Target Resolution Date: 09/09/2020 Goal Status: Active Interventions: Assess ulceration(s) every visit Treatment Activities: Referred to DME Isela Stantz for dressing supplies : 08/12/2020 Notes: Electronic Signature(s) Signed: 08/13/2020 6:11:40 PM By: Elliot Gurney, BSN, RN, CWS, Kim RN, BSN Entered By: Elliot Gurney, BSN, RN, CWS, Kim on 08/12/2020 13:31:15 [name] ([id number]) -------------------------------------------------------------------------------- Pain Assessment Details Patient Name: [name] Date of Service: 08/12/2020 12:45 PM Medical Record Number: [id number] Patient Account Number: [id number] Date of Birth/Sex: 04-10-1930 (86 y.o. F) Treating RN: Huel Coventry Primary Care Emmaline Wahba: Aram Beecham Other Clinician: Referring Derrell Milanes: Referral, Self Treating  Vernard Gram/Extender: Altamese [place] in Treatment: 0 Active Problems Location of Pain Severity and Description of Pain Patient Has Paino No Site Locations Pain Management and Medication Current Pain Management: Electronic Signature(s) Signed: 08/12/2020 3:55:47 PM By: Dayton Martes RCP, RRT, CHT Signed: 08/13/2020 6:11:40 PM By: Elliot Gurney, BSN, RN, CWS, Kim RN, BSN Entered By: Dayton Martes on 08/12/2020 12:57:29 [name] ([id number]) -------------------------------------------------------------------------------- Patient/Caregiver Education Details Patient Name: [name] Date of Service: 08/12/2020 12:45 PM Medical Record Number: [id number] Patient Account Number: [id number] Date of Birth/Gender: [birth year] (86 y.o. F) Treating RN: Huel Coventry Primary Care Physician: Aram Beecham Other Clinician: Referring Physician: Referral, Self Treating Physician/Extender: Altamese  in Treatment: 0 Education Assessment Education Provided To: Patient Education Topics Provided Safety: Handouts: Safe Transfers, Safety Methods: Demonstration, Explain/Verbal Responses: State content correctly Welcome To The [facility] Center: Handouts: Welcome To The [facility] Methods: Demonstration, Explain/Verbal Responses: State content correctly Wound/Skin Impairment: Handouts: Caring for Your Ulcer Methods: Demonstration, Explain/Verbal Responses: State content correctly Electronic Signature(s) Signed: 08/13/2020 6:11:40 PM By: Elliot Gurney, BSN,  RN, CWS, Kim RN, BSN Entered By: Elliot Gurney, BSN, RN, CWS, Kim on 08/12/2020 13:35:39 [name] ([id number]) -------------------------------------------------------------------------------- Wound Assessment Details Patient Name: [name] Date of Service: 08/12/2020 12:45 PM Medical Record Number: [id number] Patient Account Number: [id number] Date of Birth/Sex: [birth year] (86 y.o. F) Treating RN:  Tyler Aas Primary Care Bobak Oguinn: Aram Beecham Other Clinician: Referring Haseeb Fiallos: Referral, Self Treating Lonzell Dorris/Extender: Altamese [place] in Treatment: 0 Wound Status Wound Number: 12 Primary Trauma, Other Etiology: Wound Location: Left Forearm Wound Open Wounding Event: Trauma Status: Date Acquired: 08/10/2020 Comorbid Cataracts, Glaucoma, Optic Neuritis, Chronic sinus Weeks Of Treatment: 0 History: problems/congestion, Middle ear problems, Hypertension, Clustered Wound: No Rheumatoid Arthritis Photos Wound Measurements Length: (cm) Width: (cm) Depth: (cm) Area: (cm) Volume: (cm) 3 % Reduction in Area: 4.5 % Reduction in Volume: 0.1 10.603 1.06 Wound Description Classification: Partial Thickness Fou Wound Margin: Flat and Intact Slo Exudate Amount: Medium Exudate Type: Serosanguineous Exudate Color: red, brown l Odor After Cleansing: No ugh/Fibrino No Wound Bed Granulation Amount: None Present (0%) Exposed Structure Necrotic Amount: None Present (0%) Fascia Exposed: No Fat Layer (Subcutaneous Tissue) Exposed: Yes Tendon Exposed: No Muscle Exposed: No Joint Exposed: No Bone Exposed: No Treatment Notes Wound #12 (Left Forearm) Notes Xeroform gauze, ABD, Conform, stretch net to secure. Electronic Signature(s) [name], [name] ([id number]) Signed: 08/12/2020 4:49:26 PM By: Tyler Aas Entered By: Tyler Aas on 08/12/2020 13:21:13 [name] ([id number]) -------------------------------------------------------------------------------- Vitals Details Patient Name: [name] Date of Service: 08/12/2020 12:45 PM Medical Record Number: [id number] Patient Account Number: [id number] Date of Birth/Sex: 1930-09-03 (86 y.o. F) Treating RN: Huel Coventry Primary Care Justine Cossin: Aram Beecham Other Clinician: Referring Mikhai Bienvenue: Referral, Self Treating Elishia Kaczorowski/Extender: Altamese [place] in Treatment: 0 Vital Signs Time  Taken: 12:55 Temperature (F): 97.9 Height (in): 60 Pulse (bpm): 69 Source: Stated Respiratory Rate (breaths/min): 16 Weight (lbs): 125 Blood Pressure (mmHg): 142/57 Source: Stated Reference Range: 80 - 120 mg / dl Body Mass Index (BMI): 24.4 Electronic Signature(s) Signed: 08/12/2020 3:55:47 PM By: Dayton Martes RCP, RRT, CHT Entered By: Dayton Martes on 08/12/2020 12:58:53

## 2020-08-14 NOTE — Progress Notes (Signed)
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 the left elbow. I think we have clearance from the surgeon to see this now. We're using Prisma 07/11/18; we're currently dealing with a surgical wound on the left olecranon process. The patient complains of a lot of pain and drainage. When I saw her last week we did an x-ray that showed soft tissue wound and probable elbow joint effusion but no erosion to suggest osteomyelitis. The culture I did of this was somewhat surprisingly negative. She has  a small open wound with not a viable surface there is considerable undermining relative to the wound size. She is on methotrexate for rheumatoid arthritis/overlap syndrome also plaquenil. We've been using silver collagen 07/18/18-She is seen in follow-up evaluation for a left elbow wound. There is essentially no change. She is currently on Zithromax and will complete that on Friday, there is no indication to extend this. We will change to iodosorb/iodoflex and monitor for response 07/25/18-She is seen in follow-up evaluation for left elbow wound. The wound is stable with no overt evidence of infection. She has counseled with her rheumatologist. She is wanting to restart her methotrexate; a culture was obtained to rule out occult infection before starting her methotrexate. We will continue with Iodosorb/Iodoflex and she will follow-up next week. 08/01/18; this is a difficult wound over her left olecranon process. There is been concerned about infection although cultures including one done last week were negative. Pending 3 weeks ago I gave her an empiric course of antibiotics. She is having a lot of rheumatologic pain in her hands with pain and stiffness. She wants to go on her weekly methotrexate and I think it would be reasonable to do so. We have been using Iodoflex 08/01/18; difficult wound over her left olecranon process. She started back on methotrexate last week because of rheumatologic pain in her hands. We have been using Iodoflex to try and clean out the wound bed. She has been approved for Graphix PL 08/15/18; 2 week follow-up. Difficult wound over her left olecranon process. Graphix PL #1 with collagen backing 08/22/18; one-week follow-up. Difficult wound over  her left olecranon process. Graphix PL #2 08/29/18; no major improvement. Difficult wound over her left olecranon process. Still considerable undermining. Graphics PL #3 1 week follow-up. Graphix #4 09/12/18 graphics #5. Some improvement in  wound area although the undermining superiorly still has not closed down as much as I would like 09/19/18; Graphix #6 I think there is improvement in the undermining from 7 to 9:00. Wound bed looks healthy. 09/26/18 Graffix #7 undermining is 0.5 cm maximally at roughly 8:00. From 12 to 7:00 the tissue is adherent which is a major improvement there is some advancing skin from this side. 10/03/18; Graphix #8 no major changes from last week 10/10/18 Graffix #9 There are improvements. There appears to be granulation coming up to the surface here and there is a lot less undermining at 8:00. 10/17/18. Graffix #10; Dimensions are improved less undermining surface felt the but the wound is still open. Initially a surgical wound following a bursectomy 10/24/18; Graffix #11. This is really stalled over the last 2 weeks. If there is no further improvement this will be the last application.The final option for this difficult area would be plastic surgery and will set up a consult with Dr. Marina Goodell in [facility] 10/31/18; wound looks about the same. The undermining superiorly is 0.7 cm. On the lateral edges perhaps some improvement there is no drainage. 11-07-2018 patient seen today for follow-up and management of left elbow wound. She has completed a total of 11 treatments of the graffix with not much improvement. She has an upcoming appointment with plastic surgery to assist with additional treatment options for the left elbow wound on 11/19/18. There is significant amount surrounding undermining of the wound is 0.9 cm. Currently prescribed methotrexate. Wound is being treated with Indoform and border dressing. No drainage from wound. No fever, chills. or pain. 11/21/18; the patient continues to have the wound looking roughly the same with undermining from about 12 to 6:00. This has not changed all that much. She does have skin irritation around the wound that looks like drainage maceration issues. The patient  states that she was not able to have her wound dressing changed because of illness in the person he usually does this. She also did not attend her clinic appointment today with Dr. Marina Goodell because of transportation issues. She is rebooked for some time in mid January 11/28/2018; the patient has less undermining using endoform. As a understandings she saw Dr. Thad Ranger who is Dr. Lonni Fix partner. He recommended putting her in a elbow brace and I believe is written a prescription for it. He also recommended Motrin 800 mg 3 times daily. This is prescription strength ibuprofen although he did not write his prescription. This apparently was for 2 weeks. Culture I did last time grew a few methicillin sensitive staph aureus. After some difficulty due to drug intolerances/allergies and drug interactions I settled on a 5-day course of AMORE, [name] ([id number]) azithromycin 12/03/18 on evaluation today patient actually appears to be doing fairly well in regard to her elbow when compared to last time I evaluated her. With that being said there does not appear to be any signs of infection at this time. That was the big concern currently as far as the patient was concerned. Nonetheless I do feel like she is making progress in regard to the feeling of this ulcer it has been slow. She did see a Engineer, petroleum they are talking about putting her in a brace in order to allow this area to heal more  appropriately. 12/19/2018; not much change in this from the last time I have saw this.'s much smaller area than when she first came in and with less circumferential undermining however this is never really adhered. She is wearing the brace that was given or prescribed to her by plastics. She did not have a procedure offered to attempt to close this. We have been using endoform 1/15; wound actually is not doing as well as last week. She was actually not supposed to come into this clinic again until next week but  apparently her attendant noticed some redness increasing pain and she came in early. She reports the same amount of drainage. We have been using endoform. She is approved through puraply however I will only consider starting that next week 1/22; she completed the antibiotics last week. Culture I did was negative. In spite of this there is less erythema and pain complaints in the wound. Puraply #1 applied today 1/29; Puraply #2 today. Wound surface looks a lot better post debridement of adherent fibrinous material. However undermining from 6-12 is measuring worse 2/5; Puraply #3. Using her elbow brace 2/12 puraply #4 2/19 puraply #5. The 9:00 undermining measured at 0.5 cm. Undermining from 4-11 o'clock. Surface of the wound looks better and the circumference of the wound is smaller however the undermining is not really changed 2/26; still not much improvement. She has undermining from 4-9 o'clock 0.9 cm. Surface of the wound covered and adherent debris. 3/4; still no improvement. Undermining from 4-9 o'clock still around a centimeter. Surface of the wound looks somewhat better. No debridement is required we used endoform after we ended the trial of puraply last week 3/11; really no improvement at all. Still 1 cm undermining from roughly 9-3 o'clock. This is about a centimeter. The base of the wound looks fairly healthy. No debridement. We have been using endoform. I am really out of most usual options here. I could consider either another round of an amniotic advanced treatment product example epifix or perhaps regranex. Understandably the patient is a bit frustrated. We did send her to plastic surgery for a consult. Other than prescribing her a brace to immobilize the elbow they did not think she was a candidate for any further surgery. Notable that the patient is not using the brace today 3/18-Patient returns for attention to the left elbow area which apparently looked red at the home health  visit. Patient's elbow looks the same if not better compared with last visit. The area of ulceration remains the same, the base appears healthy. We are continuing to use endoform she has been encouraged to use the brace to keep the elbow straight 3/25; the patient has an appointment at the [facility] wound care center on 4/2. I had actually put her out indefinitely however she seems to want to come back here every week. This week she complains of increased pain and malodor. Dimensions of the elbow wound are larger. We have been using endoform 4/8; the patient went to [facility] where they apparently gave her meta honey and some border foam with a Tubigrip. She has been using this for a week. She says she was very impressed with them there. They did not offer her any surgical consultation. She seems to be coming back here for a second opinion on this, she does not wish to drive to [facility] every week 1/61; still using Medihoney foam border and a Tubigrip. Actually do not think she has anything on the arm at all specifically she  is not using her brace 5/6; she has been using medihoney without a lot of improvement. Undermining maximum at 9:00 at 0.5 cm may be somewhat better. She is still complaining of discomfort. She uses her elbow brace at night but is not using anything on the arm during the day, she finds it too restrictive 5/13; we switch the patient to endoform AG last week. She is complaining of more pain and worried about some circumferential erythema around the wound. I had planned to consider epifix in this wound however the patient came in with a request to see a plastic surgeon in [place] by the name of Dr. Wyline Mood who cared for a friend of hers. Noteworthy that I have already sent her to one plastic surgeon and she went for another second opinion at [facility] and they apparently did not send her to a plastic surgeon nevertheless the patient is fairly convinced that she might benefit from a skin graft  which I am doubtful. She also has rheumatoid arthritis and is on methotrexate. This was originally a surgical wound for a bursectomy a year or 2 ago Readmission: 06/28/19 on evaluation today patient presents today for reevaluation but this is due to a new issue her elbow has completely healed and looks excellent. Her right anterior lower leg has a skin tear which was sustained from her dog who jumped up on her and inadvertently scratch the area causing the skin tear. This happened yesterday. She is having some discomfort but fortunately nothing too significant which is good news. No fevers, chills, nausea, or vomiting noted at this time. The skin fortunately was knocked one completely off and we are gonna see about we approximate in the skin as best we can in using Steri-Strips to hold this in place obviously if we can get some of this to reattach that would be beneficial 07/05/2019 on evaluation today patient actually appears to be doing okay although unfortunately the skin flap that we were attempting to Steri- Stripped down last week did not take. She is developing a lot of fluid underneath the wound area unfortunately which again is not ideal. I think that this necrotic tissue needs to be removed and again it actually just wiped off during the evaluation today as I was attempting to clean the wound there did not appear to be any significant issues underlying which is good although there was some purulent drainage I did want to go ahead and see about obtaining a culture from today in order to ensure that the Z-Pak that I placed her on earlier in the week was appropriate for treating what ever infection may be causing the issue currently. This was a deep wound culture obtained today. 07/12/2019 on evaluation today patient actually appears to be doing quite well with regard to her right lower extremity ulcer all things considering. There is still little bit of hematoma not it that is noted in the  central portion of the wound along with some necrotic tissue but she is still having quite a bit of discomfort. For that reason I did not perform sharp debridement today although this wound does need some debridement of one type or another. I think we may attempt Iodoflex to see if this can be of benefit. Fortunately there is no signs of active infection at this point. 07/18/19 upon evaluation today patient appears to be doing better with regard to her ulcer on her right lower extremity. She's been tolerating the dressing changes without complication. The good news is  she seems to be making excellent progress. Overall I'm pleased with there being no signs of infection. With that being said she does tell me that she's been having some discomfort with the wrap she's unsure of exactly what about the rafters causing her trouble. 07/25/2019 on evaluation today patient appears to be doing a little better in regard to her lower extremity ulcer. Unfortunately the alginate seems to be getting really stuck to the wound bed and surrounding periwound. Fortunately there is no signs of active infection at this time. There does not appear to be any evidence of infection currently. 08/01/2019 on evaluation today patient actually appears to be doing much better with regard to her right lower extremity ulcer. She has been tolerating the dressing changes without complication. Fortunately her wound does not show any signs of infection and seems to be making good [name], [name]. ([id number]) progress. No fevers, chills, nausea, vomiting, or diarrhea. 08/08/2019 on evaluation today patient actually appears to be doing better with regard to her leg ulcer. She has been tolerating the dressing changes without complication. With that being said I think we may want to switch the dressing up today just based on the appearance of the wound bed in general. Fortunately there is no evidence of infection currently. Her pain seems to be  much better. Her son is present with her today. 08/15/2019 on evaluation today patient appears to be doing very well with regard to her right lower extremity ulcer. She still complains about feeling like the compression stocking/Tubigrip is too tight for her. Nonetheless I still think this is beneficial and is helping the wound to heal more effectively. Overall I am extremely pleased at this time with what I am seeing. 08/22/2019 on evaluation today patient actually appears to be doing quite well with regard to her right lower extremity. She has been tolerating the dressing changes without complication. Fortunately there is no signs of active infection at this time. She still complains about the Tubigrip but nonetheless I think this is something that is of utmost importance for her to continue to wear if she is going to see this area heal appropriately. 08/29/2019 on evaluation today patient actually appears to be doing well visually in regard to her wound. Unfortunately in regard to overall pain she seems to be having more pain today which I am somewhat concerned about. There does not appear to be any signs of active infection that I can tell but again with increased pain that is definitely a concern here today. I do think it may be time to switch up the dressing as well to something that will be a little bit more effective hopefully and new tissue growth she has done well with collagen in the past. 09/05/2019 on evaluation today patient actually appears to be doing well with regard to her leg ulcer. She has been tolerating the dressing changes without complication. Fortunately there is no signs of active infection. We did obtain a wound culture last week due to the fact that she still is very touchy as far as the surface of the wound is concerned. Unfortunately I am not sure what happened to that culture as there is no record in the computer system. Nonetheless we need to follow-up on this and try to see  if we can identify what exactly happened with this specimen as we have not gotten a result back either obviously. 09/12/2019 on evaluation today patient actually appears to be showing signs of good improvement upon evaluation  today. She states her pain is not nearly as bad as what it was which is also good news. She did have a wound culture which was reviewed today that showed no growth of bacteria which is good news. This coupled with the fact that her wound appears to be healing better, measuring smaller, and overall even the slough buildup is minimal compared to what we have seen previously, I feel like she is actually showing signs of excellent improvement today. 09/19/2019 on evaluation today patient appears to be doing well with regard to her wounds on evaluation today. She is showing signs of continued improvement week by week and everything is measuring significantly smaller which is great news. Overall very pleased with the progress that she has made. 09/26/2019 on evaluation today patient appears to be doing fairly well with regard to her right lower extremity ulcer. She does continue to have some drainage and again the smaller of the 2 wounds is not making as much progress as I like to see. We have been doing the silver collagen for some time I think we may want to switch to [facility] she has a little bit of hyper granular tissue this may help to keep things from becoming too dramatic in that regard. Fortunately there is no signs of infection at this time. 10/03/2019 on evaluation today patient appears to be doing fairly well with regard to her lower extremity ulcers at this time. She has been tolerating the dressing changes without complication. Fortunately the wound is measuring smaller which is great news she continues to make great progress. 10/17/2019 on evaluation today patient appears to be doing well with regard to her wounds of her right lower extremity. She states she has  been having some increased pain apparently we gave her a little bit smaller size Tubigrip last time she was here this may have been what was causing some issues. She is out of the Tubigrip that we had ordered for her and therefore did not have any of the smaller size. Fortunately there is no evidence of active infection at this time. No fever chills noted 10/24/2019 upon evaluation today patient's wounds actually appear to be showing signs of good improvement which is excellent news. She has been tolerating the dressing changes without complication and overall I am rather pleased at this point with how things seem to be progressing. The patient likewise is very happy. She still was not happy with having to use the Tubigrip she states that bunches up in her shoe. 10/31/2019 on evaluation today patient actually appears to be doing quite well with regard to her leg ulcer. She has been tolerating the dressing changes without complication. Fortunately there is no signs of active infection at this time. Overall I am very pleased with the progress that she is made. She does wonder if we could switch to the bordered adhesive Hydrofera Blue I think that something we can definitely look into for her. 11/15/2019 patient on evaluation today appears to be making excellent progress. Her wound is significantly smaller and the wound bed appears to be dramatically improved compared to even the last visit. Fortunately she is having really little to no pain at this time. No fevers, chills, nausea, vomiting, or diarrhea. 11/25/2019 on evaluation today patient appears to be doing well with regard to her lower extremity ulcer. She has been tolerating dressing changes without complication. Fortunately there is no signs of active infection at this time. No fevers, chills, nausea, vomiting, or diarrhea. 12/02/2019  on evaluation today patient unfortunately is not doing quite as well as what we would have liked to have seen  based on last week's evaluation. Fortunately there is no signs of active infection at this time. With that being said she is continuing to experience some issues with hyper granulation she was doing much better with the [facility] prior to putting the contact layer in between her skin and the [facility] [address] I think that we need to go back to that. She just can have to be very careful with removing the dressing in order to ensure it does not pull skin and get stuck. 12/16/2019 on evaluation today patient appears to be doing better her wound is measuring significantly smaller at this time compared to where things were previous. Fortunately there is no evidence of active infection at this point which is also good news. No fever chills noted. 12/23/2019 upon evaluation today patient appears to be doing excellent in regard to her lower extremity ulcer. She has been tolerating the dressing changes without complication. Fortunately there is no signs of active infection at this time. Overall I am very pleased with the fact that she is doing so well today. READMISSION 08/12/2020 [name] ([id number]) Mrs. Duplechain is a now 86 year old woman we have had in this clinic on at least 2 occasions. She spent a protracted period of time here for a postsurgical wound on her left olecranon and then more recently towards the end of last year earlier this year was in for a skin tear on her left lower leg caused by her dog. She comes in today with a reasonably acute area on her left forearm again apparently caused by her dog although it was not a bite injury. She was seen in urgent care and they managed to reoppose a lot of the skin tear although the patient states that is now reopened slightly. They have been using Neosporin. She has very fragile skin probably a combination of chronic steroid use for rheumatoid arthritis [although I do not see that she is on that now] solar skin damage. I note that she takes  methotrexate and Plaquenil for her rheumatoid arthritis Patient History Information obtained from Patient. Allergies Penicillins (Severity: Moderate, Reaction: rash), Sporanox (Severity: Moderate, Reaction: elevated LFT), adhesive (Severity: Moderate, Reaction: skin alergy), codeine (Severity: Moderate, Reaction: n/v), Cipro (Severity: Moderate, Reaction: diarrhea), bioxin (Reaction: upset stomach), doxycycline (Reaction: flu like systems) Family History Heart Disease - Siblings,Father, No family history of Cancer, Diabetes, Hereditary Spherocytosis, Hypertension, Kidney Disease, Lung Disease, Seizures, Stroke, Thyroid Problems, Tuberculosis. Social History Never smoker, Alcohol Use - Daily, Drug Use - No History, Caffeine Use - Never. Medical History Eyes Patient has history of Cataracts, Glaucoma, Optic Neuritis Ear/Nose/Mouth/Throat Patient has history of Chronic sinus problems/congestion, Middle ear problems Cardiovascular Patient has history of Hypertension Musculoskeletal Patient has history of Rheumatoid Arthritis Medical And Surgical History Notes Constitutional Symptoms (General Health) Back pain Ear/Nose/Mouth/Throat bilateral hearing aides Review of Systems (ROS) Constitutional Symptoms (General Health) Denies complaints or symptoms of Fatigue, Fever, Chills, Marked Weight Change. Hematologic/Lymphatic Denies complaints or symptoms of Bleeding / Clotting Disorders, Human Immunodeficiency Virus. Respiratory Denies complaints or symptoms of Chronic or frequent coughs, Shortness of Breath. Cardiovascular Denies complaints or symptoms of Chest pain, LE edema. Gastrointestinal Denies complaints or symptoms of Frequent diarrhea, Nausea, Vomiting. Endocrine Denies complaints or symptoms of Hepatitis, Thyroid disease, Polydypsia (Excessive Thirst). Genitourinary Denies complaints or symptoms of Kidney failure/ Dialysis, Incontinence/dribbling. Immunological Denies  complaints or symptoms of  Hives, Itching. Integumentary (Skin) Complains or has symptoms of Wounds - left lower arm. Neurologic Denies complaints or symptoms of Numbness/parasthesias, Focal/Weakness. Psychiatric Denies complaints or symptoms of Anxiety, Claustrophobia. Objective Constitutional Sitting or standing Blood Pressure is within target range for patient.. Pulse regular and within target range for patient.Marland Kitchen Respirations regular, nonMyana Schlup, [place] V. ([id number]) labored and within target range.. Temperature is normal and within the target range for the patient.Marland Kitchen appears in no distress. Vitals Time Taken: 12:55 PM, Height: 60 in, Source: Stated, Weight: 125 lbs, Source: Stated, BMI: 24.4, Temperature: 97.9 F, Pulse: 69 bpm, Respiratory Rate: 16 breaths/min, Blood Pressure: 142/57 mmHg. General Notes: Wound exam; left forearm. Small horizontal wound. Surface of this looks quite good. I think the inferior skin is where the urgent care doctor re-opposed this. Fortunately the skin that was pulled up actually looks quite good sometimes that is not the case. No debridement was required. Her radial pulse and brachial pulses are palpable. Noticeable skin damage which is either solar induced skin damage or possibly prednisone use. There is no evidence of infection. Integumentary (Hair, Skin) Wound #12 status is Open. Original cause of wound was Trauma. The wound is located on the Left Forearm. The wound measures 3cm length x 4.5cm width x 0.1cm depth; 10.603cm^2 area and 1.06cm^3 volume. There is Fat Layer (Subcutaneous Tissue) exposed. There is a medium amount of serosanguineous drainage noted. The wound margin is flat and intact. There is no granulation within the wound bed. There is no necrotic tissue within the wound bed. Assessment Active Problems ICD-10 Abrasion of left forearm, subsequent encounter Struck by dog, initial encounter Plan Wound Cleansing: Wound #12 Left  Forearm: Dial antibacterial soap, wash wounds, rinse and pat dry prior to dressing wounds Anesthetic (add to Medication List): Wound #12 Left Forearm: Topical Lidocaine 4% cream applied to wound bed prior to debridement (In Clinic Only). Primary Wound Dressing: Wound #12 Left Forearm: Xeroform Secondary Dressing: Wound #12 Left Forearm: ABD and Kerlix/Conform - Stretch net to secure Dressing Change Frequency: Wound #12 Left Forearm: Change Dressing Monday, Wednesday, Friday Follow-up Appointments: Wound #12 Left Forearm: Return Appointment in 2 weeks. 1. We will use Xeroform Curlex and net 2. This can be changed every second day/3 times a week 3. Fortunately the skin underneath this looks viable. 4. Follow-up in 2 weeks I spent 25 minutes in review of this patient's past medical history, face-to-face evaluation and preparation of this record Electronic Signature(s) Signed: 08/13/2020 9:14:54 AM By: Baltazar Najjar MD Entered By: Baltazar Najjar on 08/12/2020 13:47:09 [name] ([id number]) -------------------------------------------------------------------------------- ROS/PFSH Details Patient Name: [name] Date of Service: 08/12/2020 12:45 PM Medical Record Number: [id number] Patient Account Number: [id number] Date of Birth/Sex: 10/27/1930 (86 y.o. F) Treating RN: Tyler Aas Primary Care Provider: Aram Beecham Other Clinician: Referring Provider: Referral, Self Treating Provider/Extender: Altamese [place] in Treatment: 0 Information Obtained From Patient Constitutional Symptoms (General Health) Complaints and Symptoms: Negative for: Fatigue; Fever; Chills; Marked Weight Change Medical History: Past Medical History Notes: Back pain Hematologic/Lymphatic Complaints and Symptoms: Negative for: Bleeding / Clotting Disorders; Human Immunodeficiency Virus Respiratory Complaints and Symptoms: Negative for: Chronic or frequent coughs; Shortness of  Breath Cardiovascular Complaints and Symptoms: Negative for: Chest pain; LE edema Medical History: Positive for: Hypertension Gastrointestinal Complaints and Symptoms: Negative for: Frequent diarrhea; Nausea; Vomiting Endocrine Complaints and Symptoms: Negative for: Hepatitis; Thyroid disease; Polydypsia (Excessive Thirst) Genitourinary Complaints and Symptoms: Negative for: Kidney failure/ Dialysis; Incontinence/dribbling Immunological Complaints and Symptoms: Negative for:  Hives; Itching Integumentary (Skin) Complaints and Symptoms: Positive for: Wounds - left lower arm Neurologic Complaints and Symptoms: Negative for: Numbness/parasthesias; Focal/Weakness [place], [place] VMarland Kitchen ([id number]) Psychiatric Complaints and Symptoms: Negative for: Anxiety; Claustrophobia Eyes Medical History: Positive for: Cataracts; Glaucoma; Optic Neuritis Ear/Nose/Mouth/Throat Medical History: Positive for: Chronic sinus problems/congestion; Middle ear problems Past Medical History Notes: bilateral hearing aides Musculoskeletal Medical History: Positive for: Rheumatoid Arthritis Oncologic HBO Extended History Items Eyes: Eyes: Ear/Nose/Mouth/Throat: Ear/Nose/Mouth/Throat: Cataracts Glaucoma Chronic sinus problems/congestion Middle ear problems Immunizations Pneumococcal Vaccine: Received Pneumococcal Vaccination: No Implantable Devices Yes Family and Social History Cancer: No; Diabetes: No; Heart Disease: Yes - Siblings,Father; Hereditary Spherocytosis: No; Hypertension: No; Kidney Disease: No; Lung Disease: No; Seizures: No; Stroke: No; Thyroid Problems: No; Tuberculosis: No; Never smoker; Alcohol Use: Daily; Drug Use: No History; Caffeine Use: Never; Financial Concerns: No; Food, Clothing or Shelter Needs: No; Support System Lacking: No; Transportation Concerns: No Electronic Signature(s) Signed: 08/12/2020 4:49:26 PM By: Tyler Aas Signed: 08/13/2020 9:14:54 AM By: Baltazar Najjar  MD Entered By: Tyler Aas on 08/12/2020 14:22:19 [name] ([id number]) -------------------------------------------------------------------------------- SuperBill Details Patient Name: [name] Date of Service: 08/12/2020 Medical Record Number: [id number] Patient Account Number: [id number] Date of Birth/Sex: 12-Nov-1930 (86 y.o. F) Treating RN: Huel Coventry Primary Care Provider: Aram Beecham Other Clinician: Referring Provider: Referral, Self Treating Provider/Extender: Altamese Moro in Treatment: 0 Diagnosis Coding ICD-10 Codes Code Description S50.812D Abrasion of left forearm, subsequent encounter W54.1XXA Struck by dog, initial encounter Facility Procedures CPT4 Code: 93570177 Description: [phone] - WOUND CARE VISIT-LEV 4 EST PT Modifier: Quantity: 1 Physician Procedures CPT4 Code: 0092330 Description: 99213 - WC PHYS LEVEL 3 - EST PT Modifier: Quantity: 1 CPT4 Code: Description: ICD-10 Diagnosis Description S50.812D Abrasion of left forearm, subsequent encounter W54.1XXA Struck by dog, initial encounter Modifier: Quantity: Electronic Signature(s) Signed: 08/13/2020 9:14:54 AM By: Baltazar Najjar MD Entered By: Baltazar Najjar on 08/12/2020 13:47:29

## 2020-08-26 ENCOUNTER — Other Ambulatory Visit: Payer: Self-pay

## 2020-08-26 ENCOUNTER — Encounter: Payer: Medicare Other | Admitting: Internal Medicine

## 2020-08-26 DIAGNOSIS — S50812A Abrasion of left forearm, initial encounter: Secondary | ICD-10-CM | POA: Diagnosis not present

## 2020-09-07 ENCOUNTER — Other Ambulatory Visit: Payer: Self-pay | Admitting: Internal Medicine

## 2020-09-07 DIAGNOSIS — R531 Weakness: Secondary | ICD-10-CM

## 2020-09-07 DIAGNOSIS — R270 Ataxia, unspecified: Secondary | ICD-10-CM

## 2020-09-09 ENCOUNTER — Other Ambulatory Visit: Payer: Self-pay

## 2020-09-09 ENCOUNTER — Encounter: Payer: Medicare Other | Admitting: Internal Medicine

## 2020-09-09 DIAGNOSIS — S50812A Abrasion of left forearm, initial encounter: Secondary | ICD-10-CM | POA: Diagnosis not present

## 2020-09-09 NOTE — Progress Notes (Signed)
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 left elbow in order to correct the issue. The patient is still somewhat unsure of this but feels like this may be better than trying to take time to let this heal over a longer period of time through normal wound care measures. Again I explained that I agree this may be a faster way to go if her surgeon feels that this is indeed a good direction to take. Obviously only he can  make the judgment on whether or not the surgery would likely be successful. 06/04/18 on evaluation today patient actually presents for follow-up concerning her left elbow and left lower from the ulcer she seems to be doing very well at this point in time. She has been tolerating the dressing changes without complication. With that being said her elbow is not significantly better she actually is scheduled for surgery tomorrow. 07/04/18; the patient had an area on her left leg that is remaining closed. The open area she has now is a postsurgical wound on the left elbow. I think we have clearance from the surgeon to see this now. We're using Prisma 07/11/18; we're currently dealing with a surgical wound on the left olecranon process. The patient complains of a lot of pain and drainage. When I saw her last week we did an x-ray that showed soft tissue wound and probable elbow joint effusion but no erosion to suggest osteomyelitis. The culture I did of this was somewhat surprisingly negative. She has a small open wound with not a viable surface there is considerable undermining relative to the wound size. She is on methotrexate for rheumatoid arthritis/overlap syndrome also plaquenil. We've been using silver collagen 07/18/18-She is seen in follow-up evaluation for a left elbow wound. There is essentially no change. She is currently on Zithromax and will complete that on Friday, there is no indication to extend this. We will change to iodosorb/iodoflex and monitor for response 07/25/18-She is seen in follow-up evaluation for left elbow wound. The wound is stable with no overt evidence of infection. She has counseled with her rheumatologist. She is wanting to restart her methotrexate; a culture was obtained to rule out occult infection  before starting her methotrexate. We will continue with Iodosorb/Iodoflex and she will follow-up next week. 08/01/18; this is a difficult wound over her left olecranon process. There  is been concerned about infection although cultures including one done last week were negative. Pending 3 weeks ago I gave her an empiric course of antibiotics. She is having a lot of rheumatologic pain in her hands with pain and stiffness. She wants to go on her weekly methotrexate and I think it would be reasonable to do so. We have been using Iodoflex 08/01/18; difficult wound over her left olecranon process. She started back on methotrexate last week because of rheumatologic pain in her hands. We have been using Iodoflex to try and clean out the wound bed. She has been approved for Graphix PL 08/15/18; 2 week follow-up. Difficult wound over her left olecranon process. Graphix PL #1 with collagen backing 08/22/18; one-week follow-up. Difficult wound over her left olecranon process. Graphix PL #2 08/29/18; no major improvement. Difficult wound over her left olecranon process. Still considerable undermining. Graphics PL #3 1 week follow-up. Graphix #4 09/12/18 graphics #5. Some improvement in wound area although the undermining superiorly still has not closed down as much as I would like 09/19/18; Graphix #6 I think there is improvement in the undermining from 7 to 9:00. Wound bed looks healthy. 09/26/18 Graffix #7 undermining is 0.5 cm maximally at roughly 8:00. From 12 to 7:00 the tissue is adherent which is a major improvement there is some advancing skin from this side. 10/03/18; Graphix #8 no major changes from last week 10/10/18 Graffix #9 There are improvements. There appears to be granulation coming up to the surface here and there is a lot less undermining at 8:00. 10/17/18. Graffix #10; Dimensions are improved less undermining surface felt the but the wound is still open. Initially a surgical wound following a bursectomy 10/24/18; Graffix #11. This is really stalled over the last 2 weeks. If there is no further improvement this will be the last application.The final option for this difficult  area would be plastic surgery and will set up a consult with Dr. Marina Goodell in [facility] 10/31/18; wound looks about the same. The undermining superiorly is 0.7 cm. On the lateral edges perhaps some improvement there is no drainage. 11-07-2018 patient seen today for follow-up and management of left elbow wound. She has completed a total of 11 treatments of the graffix with not much improvement. She has an upcoming appointment with plastic surgery to assist with additional treatment options for the left elbow wound on 11/19/18. There is significant amount surrounding undermining of the wound is 0.9 cm. Currently prescribed methotrexate. Wound is being treated with Indoform and border dressing. No drainage from wound. No fever, chills. or pain. 11/21/18; the patient continues to have the wound looking roughly the same with undermining from about 12 to 6:00. This has not changed all that much. She does have skin irritation around the wound that looks like drainage maceration issues. The patient states that she was not able to have her wound dressing changed because of illness in the person he usually does this. She also did not attend her clinic appointment today with Dr. Marina Goodell because of transportation issues. She is rebooked for some time in mid January 11/28/2018; the patient has less undermining using endoform. As a understandings she saw Dr. Thad Ranger who is Dr. Lonni Fix partner. He recommended putting her in a elbow brace and I believe is written a prescription for it. He also recommended Motrin 800  mg 3 times daily. This is prescription strength ibuprofen although he did not write his prescription. This apparently was for 2 weeks. Culture I did last time grew a few methicillin sensitive staph aureus. After some difficulty due to drug intolerances/allergies and drug interactions I settled on a 5-day course of azithromycin 12/03/18 on evaluation today patient actually appears to be doing fairly  well in regard to her elbow when compared to last time I evaluated her. With that being said there does not appear to be any signs of infection at this time. That was the big concern currently as far as the patient was concerned. Nonetheless I do feel like she is making progress in regard to the feeling of this ulcer it has been slow. She did see a plastic surgeon CHERON, CORYELL ([id number]) they are talking about putting her in a brace in order to allow this area to heal more appropriately. 12/19/2018; not much change in this from the last time I have saw this.'s much smaller area than when she first came in and with less circumferential undermining however this is never really adhered. She is wearing the brace that was given or prescribed to her by plastics. She did not have a procedure offered to attempt to close this. We have been using endoform 1/15; wound actually is not doing as well as last week. She was actually not supposed to come into this clinic again until next week but apparently her attendant noticed some redness increasing pain and she came in early. She reports the same amount of drainage. We have been using endoform. She is approved through puraply however I will only consider starting that next week 1/22; she completed the antibiotics last week. Culture I did was negative. In spite of this there is less erythema and pain complaints in the wound. Puraply #1 applied today 1/29; Puraply #2 today. Wound surface looks a lot better post debridement of adherent fibrinous material. However undermining from 6-12 is measuring worse 2/5; Puraply #3. Using her elbow brace 2/12 puraply #4 2/19 puraply #5. The 9:00 undermining measured at 0.5 cm. Undermining from 4-11 o'clock. Surface of the wound looks better and the circumference of the wound is smaller however the undermining is not really changed 2/26; still not much improvement. She has undermining from 4-9 o'clock 0.9 cm. Surface of the  wound covered and adherent debris. 3/4; still no improvement. Undermining from 4-9 o'clock still around a centimeter. Surface of the wound looks somewhat better. No debridement is required we used endoform after we ended the trial of puraply last week 3/11; really no improvement at all. Still 1 cm undermining from roughly 9-3 o'clock. This is about a centimeter. The base of the wound looks fairly healthy. No debridement. We have been using endoform. I am really out of most usual options here. I could consider either another round of an amniotic advanced treatment product example epifix or perhaps regranex. Understandably the patient is a bit frustrated. We did send her to plastic surgery for a consult. Other than prescribing her a brace to immobilize the elbow they did not think she was a candidate for any further surgery. Notable that the patient is not using the brace today 3/18-Patient returns for attention to the left elbow area which apparently looked red at the home health visit. Patient's elbow looks the same if not better compared with last visit. The area of ulceration remains the same, the base appears healthy. We are continuing to use endoform she  has been encouraged to use the brace to keep the elbow straight 3/25; the patient has an appointment at the [facility] wound care center on 4/2. I had actually put her out indefinitely however she seems to want to come back here every week. This week she complains of increased pain and malodor. Dimensions of the elbow wound are larger. We have been using endoform 4/8; the patient went to [facility] where they apparently gave her meta honey and some border foam with a Tubigrip. She has been using this for a week. She says she was very impressed with them there. They did not offer her any surgical consultation. She seems to be coming back here for a second opinion on this, she does not wish to drive to [facility] every week 1/61; still using Medihoney foam border  and a Tubigrip. Actually do not think she has anything on the arm at all specifically she is not using her brace 5/6; she has been using medihoney without a lot of improvement. Undermining maximum at 9:00 at 0.5 cm may be somewhat better. She is still complaining of discomfort. She uses her elbow brace at night but is not using anything on the arm during the day, she finds it too restrictive 5/13; we switch the patient to endoform AG last week. She is complaining of more pain and worried about some circumferential erythema around the wound. I had planned to consider epifix in this wound however the patient came in with a request to see a plastic surgeon in [place] by the name of Dr. Wyline Mood who cared for a friend of hers. Noteworthy that I have already sent her to one plastic surgeon and she went for another second opinion at [facility] and they apparently did not send her to a plastic surgeon nevertheless the patient is fairly convinced that she might benefit from a skin graft which I am doubtful. She also has rheumatoid arthritis and is on methotrexate. This was originally a surgical wound for a bursectomy a year or 2 ago Readmission: 06/28/19 on evaluation today patient presents today for reevaluation but this is due to a new issue her elbow has completely healed and looks excellent. Her right anterior lower leg has a skin tear which was sustained from her dog who jumped up on her and inadvertently scratch the area causing the skin tear. This happened yesterday. She is having some discomfort but fortunately nothing too significant which is good news. No fevers, chills, nausea, or vomiting noted at this time. The skin fortunately was knocked one completely off and we are gonna see about we approximate in the skin as best we can in using Steri-Strips to hold this in place obviously if we can get some of this to reattach that would be beneficial 07/05/2019 on evaluation today patient actually appears to  be doing okay although unfortunately the skin flap that we were attempting to Steri- Stripped down last week did not take. She is developing a lot of fluid underneath the wound area unfortunately which again is not ideal. I think that this necrotic tissue needs to be removed and again it actually just wiped off during the evaluation today as I was attempting to clean the wound there did not appear to be any significant issues underlying which is good although there was some purulent drainage I did want to go ahead and see about obtaining a culture from today in order to ensure that the Z-Pak that I placed her on earlier in the week was  appropriate for treating what ever infection may be causing the issue currently. This was a deep wound culture obtained today. 07/12/2019 on evaluation today patient actually appears to be doing quite well with regard to her right lower extremity ulcer all things considering. There is still little bit of hematoma not it that is noted in the central portion of the wound along with some necrotic tissue but she is still having quite a bit of discomfort. For that reason I did not perform sharp debridement today although this wound does need some debridement of one type or another. I think we may attempt Iodoflex to see if this can be of benefit. Fortunately there is no signs of active infection at this point. 07/18/19 upon evaluation today patient appears to be doing better with regard to her ulcer on her right lower extremity. She's been tolerating the dressing changes without complication. The good news is she seems to be making excellent progress. Overall I'm pleased with there being no signs of infection. With that being said she does tell me that she's been having some discomfort with the wrap she's unsure of exactly what about the rafters causing her trouble. 07/25/2019 on evaluation today patient appears to be doing a little better in regard to her lower extremity ulcer.  Unfortunately the alginate seems to be getting really stuck to the wound bed and surrounding periwound. Fortunately there is no signs of active infection at this time. There does not appear to be any evidence of infection currently. 08/01/2019 on evaluation today patient actually appears to be doing much better with regard to her right lower extremity ulcer. She has been tolerating the dressing changes without complication. Fortunately her wound does not show any signs of infection and seems to be making good progress. No fevers, chills, nausea, vomiting, or diarrhea. 08/08/2019 on evaluation today patient actually appears to be doing better with regard to her leg ulcer. She has been tolerating the dressing changes without complication. With that being said I think we may want to switch the dressing up today just based on the appearance of the wound bed in general. Fortunately there is no evidence of infection currently. Her pain seems to be much better. Her son is present with her AADVIKA, KONEN ([id number]) today. 08/15/2019 on evaluation today patient appears to be doing very well with regard to her right lower extremity ulcer. She still complains about feeling like the compression stocking/Tubigrip is too tight for her. Nonetheless I still think this is beneficial and is helping the wound to heal more effectively. Overall I am extremely pleased at this time with what I am seeing. 08/22/2019 on evaluation today patient actually appears to be doing quite well with regard to her right lower extremity. She has been tolerating the dressing changes without complication. Fortunately there is no signs of active infection at this time. She still complains about the Tubigrip but nonetheless I think this is something that is of utmost importance for her to continue to wear if she is going to see this area heal appropriately. 08/29/2019 on evaluation today patient actually appears to be doing well visually in  regard to her wound. Unfortunately in regard to overall pain she seems to be having more pain today which I am somewhat concerned about. There does not appear to be any signs of active infection that I can tell but again with increased pain that is definitely a concern here today. I do think it may be time to switch up  the dressing as well to something that will be a little bit more effective hopefully and new tissue growth she has done well with collagen in the past. 09/05/2019 on evaluation today patient actually appears to be doing well with regard to her leg ulcer. She has been tolerating the dressing changes without complication. Fortunately there is no signs of active infection. We did obtain a wound culture last week due to the fact that she still is very touchy as far as the surface of the wound is concerned. Unfortunately I am not sure what happened to that culture as there is no record in the computer system. Nonetheless we need to follow-up on this and try to see if we can identify what exactly happened with this specimen as we have not gotten a result back either obviously. 09/12/2019 on evaluation today patient actually appears to be showing signs of good improvement upon evaluation today. She states her pain is not nearly as bad as what it was which is also good news. She did have a wound culture which was reviewed today that showed no growth of bacteria which is good news. This coupled with the fact that her wound appears to be healing better, measuring smaller, and overall even the slough buildup is minimal compared to what we have seen previously, I feel like she is actually showing signs of excellent improvement today. 09/19/2019 on evaluation today patient appears to be doing well with regard to her wounds on evaluation today. She is showing signs of continued improvement week by week and everything is measuring significantly smaller which is great news. Overall very pleased with the  progress that she has made. 09/26/2019 on evaluation today patient appears to be doing fairly well with regard to her right lower extremity ulcer. She does continue to have some drainage and again the smaller of the 2 wounds is not making as much progress as I like to see. We have been doing the silver collagen for some time I think we may want to switch to [facility] she has a little bit of hyper granular tissue this may help to keep things from becoming too dramatic in that regard. Fortunately there is no signs of infection at this time. 10/03/2019 on evaluation today patient appears to be doing fairly well with regard to her lower extremity ulcers at this time. She has been tolerating the dressing changes without complication. Fortunately the wound is measuring smaller which is great news she continues to make great progress. 10/17/2019 on evaluation today patient appears to be doing well with regard to her wounds of her right lower extremity. She states she has been having some increased pain apparently we gave her a little bit smaller size Tubigrip last time she was here this may have been what was causing some issues. She is out of the Tubigrip that we had ordered for her and therefore did not have any of the smaller size. Fortunately there is no evidence of active infection at this time. No fever chills noted 10/24/2019 upon evaluation today patient's wounds actually appear to be showing signs of good improvement which is excellent news. She has been tolerating the dressing changes without complication and overall I am rather pleased at this point with how things seem to be progressing. The patient likewise is very happy. She still was not happy with having to use the Tubigrip she states that bunches up in her shoe. 10/31/2019 on evaluation today patient actually appears to be doing quite well  with regard to her leg ulcer. She has been tolerating the dressing changes without complication.  Fortunately there is no signs of active infection at this time. Overall I am very pleased with the progress that she is made. She does wonder if we could switch to the bordered adhesive Hydrofera Blue I think that something we can definitely look into for her. 11/15/2019 patient on evaluation today appears to be making excellent progress. Her wound is significantly smaller and the wound bed appears to be dramatically improved compared to even the last visit. Fortunately she is having really little to no pain at this time. No fevers, chills, nausea, vomiting, or diarrhea. 11/25/2019 on evaluation today patient appears to be doing well with regard to her lower extremity ulcer. She has been tolerating dressing changes without complication. Fortunately there is no signs of active infection at this time. No fevers, chills, nausea, vomiting, or diarrhea. 12/02/2019 on evaluation today patient unfortunately is not doing quite as well as what we would have liked to have seen based on last week's evaluation. Fortunately there is no signs of active infection at this time. With that being said she is continuing to experience some issues with hyper granulation she was doing much better with the [facility] prior to putting the contact layer in between her skin and the [facility] I think that we need to go back to that. She just can have to be very careful with removing the dressing in order to ensure it does not pull skin and get stuck. 12/16/2019 on evaluation today patient appears to be doing better her wound is measuring significantly smaller at this time compared to where things were previous. Fortunately there is no evidence of active infection at this point which is also good news. No fever chills noted. 12/23/2019 upon evaluation today patient appears to be doing excellent in regard to her lower extremity ulcer. She has been tolerating the dressing changes without complication. Fortunately there is no  signs of active infection at this time. Overall I am very pleased with the fact that she is doing so well today. READMISSION 08/12/2020 Mrs. Ebron is a now 86 year old woman we have had in this clinic on at least 2 occasions. She spent a protracted period of time here for a postsurgical wound on her left olecranon and then more recently towards the end of last year earlier this year was in for a skin tear on her left lower leg caused by her dog. She comes in today with a reasonably acute area on her left forearm again apparently caused by her dog although it was not a bite injury. She was seen in urgent care and they managed to reoppose a lot of the skin tear although the patient states that is now reopened slightly. They have been using Neosporin. She has very fragile skin probably a combination of chronic steroid use for rheumatoid ABEL, RA ([id number]) arthritis [although I do not see that she is on that now] solar skin damage. I note that she takes methotrexate and Plaquenil for her rheumatoid arthritis 9/15; this patient had a skin tear on her left forearm from trauma with her dog. She was seen in urgent care and they reapposed most of the separated skin. Fortunately for her most of this is remained intact and the skin above and below the wound actually looks healthy. Where there was a separation and the 2 flaps of skin there is still an open area however the granulation tissue looks  healthy here. We have been using Xeroform however I had like to change to [facility] 9/29; skin tear on her left arm from a dog scratch. The skin was reapposed in an urgent care and surprisingly stayed viable. We used Hydrofera Blue last time the wound is smaller Objective Constitutional Sitting or standing Blood Pressure is within target range for patient.. Pulse regular and within target range for patient.Marland Kitchen Respirations regular, non- labored and within target range.. Temperature is normal and within  the target range for the patient.Marland Kitchen appears in no distress. Vitals Time Taken: 1:40 PM, Height: 60 in, Weight: 125 lbs, BMI: 24.4, Temperature: 98.1 F, Pulse: 68 bpm, Respiratory Rate: 16 breaths/min, Blood Pressure: 138/64 mmHg. General Notes: Wound exam; left forearm. Unfortunately I think the inferior rim of this skin tear has folded back on itself. I was able to remove nonviable material from the top edge of the wound however the skin was difficult. Bleeding controlled with direct pressure Integumentary (Hair, Skin) Wound #12 status is Open. Original cause of wound was Trauma. The wound is located on the Left Forearm. The wound measures 0.3cm length x 1.7cm width x 0.1cm depth; 0.401cm^2 area and 0.04cm^3 volume. There is Fat Layer (Subcutaneous Tissue) exposed. There is no tunneling or undermining noted. There is a medium amount of serosanguineous drainage noted. The wound margin is flat and intact. There is medium (34- 66%) red granulation within the wound bed. There is no necrotic tissue within the wound bed. Assessment Active Problems ICD-10 Abrasion of left forearm, subsequent encounter Struck by dog, initial encounter Procedures Wound #12 Pre-procedure diagnosis of Wound #12 is a Trauma, Other located on the Left Forearm . There was a Excisional Skin/Subcutaneous Tissue Debridement with a total area of 0.51 sq cm performed by Maxwell Caul, MD. With the following instrument(s): Curette to remove Viable and Non-Viable tissue/material. Material removed includes Eschar, Subcutaneous Tissue, and Slough after achieving pain control using Lidocaine. A time out was conducted at 13:50, prior to the start of the procedure. A Minimum amount of bleeding was controlled with Pressure. The procedure was tolerated well. Post Debridement Measurements: 0.3cm length x 1.7cm width x 0.1cm depth; 0.04cm^3 volume. Character of Wound/Ulcer Post Debridement is stable. Post procedure Diagnosis Wound  #12: Same as Pre-Procedure Plan Wound Cleansing: Wound #12 Left Forearm: Dial antibacterial soap, wash wounds, rinse and pat dry prior to dressing wounds SIMI, BRIEL ([id number]) Anesthetic (add to Medication List): Wound #12 Left Forearm: Topical Lidocaine 4% cream applied to wound bed prior to debridement (In Clinic Only). Primary Wound Dressing: Wound #12 Left Forearm: Hydrafera Blue Ready Transfer Secondary Dressing: Wound #12 Left Forearm: ABD and Kerlix/Conform - Stretch net to secure Dressing Change Frequency: Wound #12 Left Forearm: Change Dressing Monday, Wednesday, Friday Follow-up Appointments: Wound #12 Left Forearm: Return Appointment in 2 weeks. 1. I am continuing with hydrofera blue 2 this is going to have to have fully epithelialized from the superior edge of the wound which I debrided today. Inferiorly there is a rolled edge of normal skin. I am reluctant to debride this Foley unless necessary Electronic Signature(s) Signed: 09/09/2020 4:16:06 PM By: Baltazar Najjar MD Entered By: Baltazar Najjar on 09/09/2020 14:07:21 [name] ([id number]) -------------------------------------------------------------------------------- SuperBill Details Patient Name: [name] Date of Service: 09/09/2020 Medical Record Number: [id number] Patient Account Number: [id number] Date of Birth/Sex: 1930/05/07 (86 y.o. F) Treating RN: Huel Coventry Primary Care Provider: Aram Beecham Other Clinician: Referring Provider: Aram Beecham Treating Provider/Extender: Altamese [place]  in Treatment: 4 Diagnosis Coding ICD-10 Codes Code Description S50.812D Abrasion of left forearm, subsequent encounter W54.1XXA Struck by dog, initial encounter Facility Procedures CPT4 Code: 16109604 Description: [phone] - DEB SUBQ TISSUE 20 SQ CM/< Modifier: Quantity: 1 CPT4 Code: Description: ICD-10 Diagnosis Description S50.812D Abrasion of left forearm, subsequent  encounter Modifier: Quantity: Physician Procedures CPT4 Code: 1191478 Description: 11042 - WC PHYS SUBQ TISS 20 SQ CM Modifier: Quantity: 1 CPT4 Code: Description: ICD-10 Diagnosis Description S50.812D Abrasion of left forearm, subsequent encounter Modifier: Quantity: Electronic Signature(s) Signed: 09/09/2020 4:16:06 PM By: Baltazar Najjar MD Entered By: Baltazar Najjar on 09/09/2020 14:07:40

## 2020-09-10 NOTE — Progress Notes (Signed)
KEYLEEN, CERRATO ([id number]) Visit Report for 09/09/2020 Arrival Information Details Patient Name: [name], [name]. Date of Service: 09/09/2020 1:30 PM Medical Record Number: [id number] Patient Account Number: [id number] Date of Birth/Sex: 1930/12/03 (86 y.o. F) Treating RN: Huel Coventry Primary Care Madline Oesterling: Aram Beecham Other Clinician: Referring Aletha Allebach: Aram Beecham Treating Jasminemarie Sherrard/Extender: Altamese [place] in Treatment: 4 Visit Information History Since Last Visit Added or deleted any medications: No Patient Arrived: Wheel Chair Any new allergies or adverse reactions: No Arrival Time: 13:34 Had a fall or experienced change in No Accompanied By: friend activities of daily living that may affect Transfer Assistance: None risk of falls: Patient Identification Verified: Yes Signs or symptoms of abuse/neglect since last visito No Secondary Verification Process Completed: Yes Hospitalized since last visit: No Implantable device outside of the clinic excluding No cellular tissue based products placed in the center since last visit: Has Dressing in Place as Prescribed: Yes Pain Present Now: No Electronic Signature(s) Signed: 09/10/2020 4:27:56 PM By: Dayton Martes RCP, RRT, CHT Entered By: Dayton Martes on 09/09/2020 13:40:19 [name] ([id number]) -------------------------------------------------------------------------------- Encounter Discharge Information Details Patient Name: [name] Date of Service: 09/09/2020 1:30 PM Medical Record Number: [id number] Patient Account Number: [id number] Date of Birth/Sex: Jul 10, 1930 (86 y.o. F) Treating RN: Rodell Perna Primary Care Ilee Randleman: Aram Beecham Other Clinician: Referring Quyen Cutsforth: Aram Beecham Treating Avalynn Bowe/Extender: Altamese [place] in Treatment: 4 Encounter Discharge Information Items Post Procedure Vitals Discharge Condition: Stable Temperature (F):  98.1 Ambulatory Status: Wheelchair Pulse (bpm): 68 Discharge Destination: Home Respiratory Rate (breaths/min): 16 Transportation: Private Auto Blood Pressure (mmHg): 138/64 Accompanied By: family Schedule Follow-up Appointment: No Clinical Summary of Care: Electronic Signature(s) Signed: 09/09/2020 3:20:27 PM By: Rodell Perna Entered By: Rodell Perna on 09/09/2020 13:55:37 [name] ([id number]) -------------------------------------------------------------------------------- Lower Extremity Assessment Details Patient Name: [name] Date of Service: 09/09/2020 1:30 PM Medical Record Number: [id number] Patient Account Number: [id number] Date of Birth/Sex: 06-May-1930 (86 y.o. F) Treating RN: Tyler Aas Primary Care Ousman Dise: Aram Beecham Other Clinician: Referring Seddrick Flax: Aram Beecham Treating Rakesha Dalporto/Extender: Altamese [place] in Treatment: 4 Electronic Signature(s) Signed: 09/09/2020 4:12:45 PM By: Tyler Aas Entered By: Tyler Aas on 09/09/2020 13:48:09 [name] ([id number]) -------------------------------------------------------------------------------- Multi Wound Chart Details Patient Name: [name] Date of Service: 09/09/2020 1:30 PM Medical Record Number: [id number] Patient Account Number: [id number] Date of Birth/Sex: 1930/06/09 (86 y.o. F) Treating RN: Rodell Perna Primary Care Lashai Grosch: Aram Beecham Other Clinician: Referring Kathelene Rumberger: Aram Beecham Treating [place] Prout/Extender: Altamese [place] in Treatment: 4 Vital Signs Height(in): 60 Pulse(bpm): 68 Weight(lbs): 125 Blood Pressure(mmHg): 138/64 Body Mass Index(BMI): 24 Temperature(F): 98.1 Respiratory Rate(breaths/min): 16 Photos: [N/A:N/A] Wound Location: Left Forearm N/A N/A Wounding Event: Trauma N/A N/A Primary Etiology: Trauma, Other N/A N/A Comorbid History: Cataracts, Glaucoma, Optic N/A N/A Neuritis, Chronic  sinus problems/congestion, Middle ear problems, Hypertension, Rheumatoid Arthritis Date Acquired: 08/10/2020 N/A N/A Barnett of Treatment: 4 N/A N/A Wound Status: Open N/A N/A Measurements L x W x D (cm) 0.3x1.7x0.1 N/A N/A Area (cm) : 0.401 N/A N/A Volume (cm) : 0.04 N/A N/A % Reduction in Area: 96.20% N/A N/A % Reduction in Volume: 96.20% N/A N/A Classification: Partial Thickness N/A N/A Exudate Amount: Medium N/A N/A Exudate Type: Serosanguineous N/A N/A Exudate Color: red, brown N/A N/A Wound Margin: Flat and Intact N/A N/A Granulation Amount: Medium (34-66%) N/A N/A Granulation Quality: Red N/A N/A Necrotic Amount: None Present (0%) N/A N/A Exposed Structures: Fat Layer (Subcutaneous Tissue):  N/A N/A Yes Fascia: No Tendon: No Muscle: No Joint: No Bone: No Epithelialization: Medium (34-66%) N/A N/A Debridement: Debridement - Excisional N/A N/A Pre-procedure Verification/Time 13:50 N/A N/A Out Taken: Pain Control: Lidocaine N/A N/A Tissue Debrided: Necrotic/Eschar, Subcutaneous, N/A N/A Slough Level: Skin/Subcutaneous Tissue N/A N/A Debridement Area (sq cm): 0.51 N/A N/A Instrument: Curette N/A N/A Bleeding: Minimum N/A N/A Hemostasis Achieved: Pressure N/A N/A MOSLEY, STITZ ([id number]) Debridement Treatment Procedure was tolerated well N/A N/A Response: Post Debridement 0.3x1.7x0.1 N/A N/A Measurements L x W x D (cm) Post Debridement Volume: 0.04 N/A N/A (cm) Procedures Performed: Debridement N/A N/A Treatment Notes Wound #12 (Left Forearm) Notes Hydrofera blue, ABD, Conform, stretch net to secure. Electronic Signature(s) Signed: 09/09/2020 4:16:06 PM By: Baltazar Najjar MD Entered By: Baltazar Najjar on 09/09/2020 13:59:19 [name] ([id number]) -------------------------------------------------------------------------------- Multi-Disciplinary Care Plan Details Patient Name: [name], [name] Date of Service: 09/09/2020 1:30 PM Medical Record  Number: [id number] Patient Account Number: [id number] Date of Birth/Sex: 1930/04/12 (86 y.o. F) Treating RN: Rodell Perna Primary Care Zylon Creamer: Aram Beecham Other Clinician: Referring Terance Pomplun: Aram Beecham Treating Diago Haik/Extender: Altamese [place] in Treatment: 4 Active Inactive Abuse / Safety / Falls / Self Care Management Nursing Diagnoses: History of Falls Goals: Patient/caregiver will verbalize understanding of skin care regimen Date Initiated: 08/12/2020 Target Resolution Date: 08/26/2020 Goal Status: Active Interventions: Assess fall risk on admission and as needed Notes: Orientation to the Wound Care Program Nursing Diagnoses: Knowledge deficit related to the wound healing center program Goals: Patient/caregiver will verbalize understanding of the Wound Healing Center Program Date Initiated: 08/12/2020 Target Resolution Date: 08/26/2020 Goal Status: Active Interventions: Provide education on orientation to the wound center Notes: Wound/Skin Impairment Nursing Diagnoses: Impaired tissue integrity Goals: Ulcer/skin breakdown will have a volume reduction of 30% by week 4 Date Initiated: 08/12/2020 Target Resolution Date: 09/09/2020 Goal Status: Active Interventions: Assess ulceration(s) every visit Treatment Activities: Referred to DME Darran Gabay for dressing supplies : 08/12/2020 Notes: Electronic Signature(s) Signed: 09/09/2020 3:20:27 PM By: Rodell Perna Entered By: Rodell Perna on 09/09/2020 13:50:19 [name] ([id number]) -------------------------------------------------------------------------------- Pain Assessment Details Patient Name: [name] Date of Service: 09/09/2020 1:30 PM Medical Record Number: [id number] Patient Account Number: [id number] Date of Birth/Sex: [birth year] (86 y.o. F) Treating RN: Tyler Aas Primary Care Leyah Bocchino: Aram Beecham Other Clinician: Referring Makaley Storts: Aram Beecham Treating Danamarie Minami/Extender:  Altamese [place] in Treatment: 4 Active Problems Location of Pain Severity and Description of Pain Patient Has Paino No Site Locations Pain Management and Medication Current Pain Management: Electronic Signature(s) Signed: 09/09/2020 4:12:45 PM By: Tyler Aas Entered By: Tyler Aas on 09/09/2020 13:44:38 [name] ([id number]) -------------------------------------------------------------------------------- Patient/Caregiver Education Details Patient Name: [name] Date of Service: 09/09/2020 1:30 PM Medical Record Number: [id number] Patient Account Number: [id number] Date of Birth/Gender: Feb 18, 1930 (86 y.o. F) Treating RN: Rodell Perna Primary Care Physician: Aram Beecham Other Clinician: Referring Physician: Aram Beecham Treating Physician/Extender: Altamese [place] in Treatment: 4 Education Assessment Education Provided To: Patient Education Topics Provided Wound/Skin Impairment: Handouts: Caring for Your Ulcer Methods: Demonstration, Explain/Verbal Responses: State content correctly Electronic Signature(s) Signed: 09/09/2020 3:20:27 PM By: Rodell Perna Entered By: Rodell Perna on 09/09/2020 13:54:44 [name] ([id number]) -------------------------------------------------------------------------------- Wound Assessment Details Patient Name: [name] Date of Service: 09/09/2020 1:30 PM Medical Record Number: [id number] Patient Account Number: [id number] Date of Birth/Sex: [birth year] (86 y.o. F) Treating RN: Tyler Aas Primary Care Andrej Spagnoli: Aram Beecham Other Clinician: Referring Tiffany Talarico: Aram Beecham Treating Triva Hueber/Extender: Leanord Hawking  [name] in Treatment: 4 Wound Status Wound Number: 12 Primary Trauma, Other Etiology: Wound Location: Left Forearm Wound Open Wounding Event: Trauma Status: Date Acquired: 08/10/2020 Comorbid Cataracts, Glaucoma, Optic Neuritis, Chronic sinus Barnett Of  Treatment: 4 History: problems/congestion, Middle ear problems, Hypertension, Clustered Wound: No Rheumatoid Arthritis Photos Wound Measurements Length: (cm) 0.3 % Re Width: (cm) 1.7 % Re Depth: (cm) 0.1 Epit Area: (cm) 0.401 Tun Volume: (cm) 0.04 Und duction in Area: 96.2% duction in Volume: 96.2% helialization: Medium (34-66%) neling: No ermining: No Wound Description Classification: Partial Thickness Fou Wound Margin: Flat and Intact Slo Exudate Amount: Medium Exudate Type: Serosanguineous Exudate Color: red, brown l Odor After Cleansing: No ugh/Fibrino No Wound Bed Granulation Amount: Medium (34-66%) Exposed Structure Granulation Quality: Red Fascia Exposed: No Necrotic Amount: None Present (0%) Fat Layer (Subcutaneous Tissue) Exposed: Yes Tendon Exposed: No Muscle Exposed: No Joint Exposed: No Bone Exposed: No Treatment Notes Wound #12 (Left Forearm) Notes Hydrofera blue, ABD, Conform, stretch net to secure. Electronic Signature(s) [name], [name] ([id number]) Signed: 09/09/2020 4:12:45 PM By: Tyler Aas Entered By: Tyler Aas on 09/09/2020 13:47:55 [name] ([id number]) -------------------------------------------------------------------------------- Vitals Details Patient Name: [name] Date of Service: 09/09/2020 1:30 PM Medical Record Number: [id number] Patient Account Number: [id number] Date of Birth/Sex: 04/07/1930 (86 y.o. F) Treating RN: Huel Coventry Primary Care Avery Klingbeil: Aram Beecham Other Clinician: Referring Honor Fairbank: Aram Beecham Treating Shiloh Southern/Extender: Altamese [place] in Treatment: 4 Vital Signs Time Taken: 13:40 Temperature (F): 98.1 Height (in): 60 Pulse (bpm): 68 Weight (lbs): 125 Respiratory Rate (breaths/min): 16 Body Mass Index (BMI): 24.4 Blood Pressure (mmHg): 138/64 Reference Range: 80 - 120 mg / dl Electronic Signature(s) Signed: 09/10/2020 4:27:56 PM By: Dayton Martes  RCP, RRT, CHT Entered By: Dayton Martes on 09/09/2020 13:40:53

## 2020-09-21 ENCOUNTER — Ambulatory Visit
Admission: RE | Admit: 2020-09-21 | Discharge: 2020-09-21 | Disposition: A | Discharge status: Home / Self Care | Payer: Medicare Other | Source: Ambulatory Visit | Attending: Internal Medicine | Admitting: Internal Medicine

## 2020-09-21 ENCOUNTER — Other Ambulatory Visit: Payer: Self-pay

## 2020-09-21 DIAGNOSIS — R531 Weakness: Secondary | ICD-10-CM | POA: Diagnosis present

## 2020-09-21 DIAGNOSIS — R270 Ataxia, unspecified: Secondary | ICD-10-CM | POA: Insufficient documentation

## 2020-09-23 ENCOUNTER — Encounter: Payer: Medicare Other | Attending: Internal Medicine | Admitting: Internal Medicine

## 2020-09-23 ENCOUNTER — Other Ambulatory Visit: Payer: Self-pay

## 2020-09-23 DIAGNOSIS — W548XXD Other contact with dog, subsequent encounter: Secondary | ICD-10-CM | POA: Diagnosis not present

## 2020-09-23 DIAGNOSIS — S50812D Abrasion of left forearm, subsequent encounter: Secondary | ICD-10-CM | POA: Diagnosis not present

## 2020-09-28 NOTE — Progress Notes (Signed)
[name], [name] ([id number]) Visit Report for 09/23/2020 Arrival Information Details Patient Name: NECOLA, BLUESTEIN Date of Service: 09/23/2020 12:30 PM Medical Record Number: [id number] Patient Account Number: [id number] Date of Birth/Sex: 03/03/1930 (86 y.o. F) Treating RN: Rodell Perna Primary Care Mianna Iezzi: Aram Beecham Other Clinician: Referring Skyeler Scalese: Aram Beecham Treating Salih Williamson/Extender: Altamese [place] in Treatment: 6 Visit Information History Since Last Visit Added or deleted any medications: No Patient Arrived: Wheel Chair Any new allergies or adverse reactions: No Arrival Time: 12:53 Had a fall or experienced change in No Accompanied By: family activities of daily living that may affect Transfer Assistance: None risk of falls: Patient Identification Verified: Yes Signs or symptoms of abuse/neglect since last visito No Hospitalized since last visit: No Has Dressing in Place as Prescribed: Yes Pain Present Now: No Electronic Signature(s) Signed: 09/23/2020 2:40:35 PM By: Rodell Perna Entered By: Rodell Perna on 09/23/2020 12:53:37 [name] ([id number]) -------------------------------------------------------------------------------- Clinic Level of Care Assessment Details Patient Name: [name] Date of Service: 09/23/2020 12:30 PM Medical Record Number: [id number] Patient Account Number: [id number] Date of Birth/Sex: 11-26-1930 (86 y.o. F) Treating RN: Huel Coventry Primary Care Toshie Demelo: Aram Beecham Other Clinician: Referring Chiquita Heckert: Aram Beecham Treating Loveda Colaizzi/Extender: Altamese [place] in Treatment: 6 Clinic Level of Care Assessment Items TOOL 4 Quantity Score []  - Use when only an EandM is performed on FOLLOW-UP visit 0 ASSESSMENTS - Nursing Assessment / Reassessment X - Reassessment of Co-morbidities (includes updates in patient status) 1 10 X- 1 5 Reassessment of Adherence to Treatment Plan ASSESSMENTS -  Wound and Skin Assessment / Reassessment X - Simple Wound Assessment / Reassessment - one wound 1 5 []  - 0 Complex Wound Assessment / Reassessment - multiple wounds []  - 0 Dermatologic / Skin Assessment (not related to wound area) ASSESSMENTS - Focused Assessment []  - Circumferential Edema Measurements - multi extremities 0 []  - 0 Nutritional Assessment / Counseling / Intervention []  - 0 Lower Extremity Assessment (monofilament, tuning fork, pulses) []  - 0 Peripheral Arterial Disease Assessment (using hand held doppler) ASSESSMENTS - Ostomy and/or Continence Assessment and Care []  - Incontinence Assessment and Management 0 []  - 0 Ostomy Care Assessment and Management (repouching, etc.) PROCESS - Coordination of Care X - Simple Patient / Family Education for ongoing care 1 15 []  - 0 Complex (extensive) Patient / Family Education for ongoing care []  - 0 Staff obtains , Records, Test Results / Process Orders []  - 0 Staff telephones HHA, Nursing Homes / Clarify orders / etc []  - 0 Routine Transfer to another Facility (non-emergent condition) []  - 0 Routine Hospital Admission (non-emergent condition) []  - 0 New Admissions / / Ordering NPWT, Apligraf, etc. []  - 0 Emergency Hospital Admission (emergent condition) []  - 0 Simple Discharge Coordination []  - 0 Complex (extensive) Discharge Coordination PROCESS - Special Needs []  - Pediatric / Minor Patient Management 0 []  - 0 Isolation Patient Management []  - 0 Hearing / Language / Visual special needs []  - 0 Assessment of Community assistance (transportation, D/C planning, etc.) []  - 0 Additional assistance / Altered mentation []  - 0 Support Surface(s) Assessment (bed, cushion, seat, etc.) INTERVENTIONS - Wound Cleansing / Measurement [name], [name] ( ) X- 1 5 Simple Wound Cleansing - one wound []  - 0 Complex Wound Cleansing - multiple wounds X- 1 5 Wound Imaging (photographs -  any number of wounds) []  - 0 Wound Tracing (instead of photographs) X- 1 5 Simple Wound Measurement - one wound []  -  0 Complex Wound Measurement - multiple wounds INTERVENTIONS - Wound Dressings X - Small Wound Dressing one or multiple wounds 1 10 []  - 0 Medium Wound Dressing one or multiple wounds []  - 0 Large Wound Dressing one or multiple wounds []  - 0 Application of Medications - topical []  - 0 Application of Medications - injection INTERVENTIONS - Miscellaneous []  - External ear exam 0 []  - 0 Specimen Collection (cultures, biopsies, blood, body fluids, etc.) []  - 0 Specimen(s) / Culture(s) sent or taken to Lab for analysis []  - 0 Patient Transfer (multiple staff / / Similar devices) []  - 0 Simple Staple / Suture removal (25 or less) []  - 0 Complex Staple / Suture removal (26 or more) []  - 0 Hypo / Hyperglycemic Management (close monitor of Blood Glucose) []  - 0 Ankle / Brachial Index (ABI) - do not check if billed separately X- 1 5 Vital Signs Has the patient been seen at the hospital within the last three years: Yes Total Score: 65 Level Of Care: New/Established - Level 2 Electronic Signature(s) Signed: 09/28/2020 10:00:34 AM By: , BSN, RN, CWS, Kim RN, BSN Entered By: , BSN, RN, CWS, Kim on 09/23/2020 13:16:28 ( ) -------------------------------------------------------------------------------- Encounter Discharge Information Details Patient Name: Date of Service: 09/23/2020 12:30 PM Medical Record Number: Patient Account Number: Date of Birth/Sex: [birth year] (86 y.o. F) Treating RN: 09/30/2020 Primary Care Shaylynn Nulty: Elliot Gurney Other Clinician: Referring Auden Wettstein: Elliot Gurney Treating Honore Wipperfurth/Extender: 09/25/2020 in Treatment: 6 Encounter Discharge Information Items Discharge Condition: Stable Ambulatory Status: Wheelchair Discharge Destination:  Home Transportation: Private Auto Accompanied By: caregiver Schedule Follow-up Appointment: Yes Clinical Summary of Care: Electronic Signature(s) Signed: 09/28/2020 10:00:34 AM By: [id number], BSN, RN, CWS, Kim RN, BSN Entered By: [name], BSN, RN, CWS, Kim on 09/23/2020 13:18:21 [id number] ([id number]) -------------------------------------------------------------------------------- Lower Extremity Assessment Details Patient Name: [name], [name] Date of Service: 09/23/2020 12:30 PM Medical Record Number: Huel Coventry Patient Account Number: Aram Beecham Date of Birth/Sex: 05-30-1930 (86 y.o. F) Treating RN: 09/30/2020 Primary Care Lolitha Tortora: Elliot Gurney Other Clinician: Referring Amadu Schlageter: Elliot Gurney Treating Celest Reitz/Extender: 09/25/2020 in Treatment: 6 Electronic Signature(s) Signed: 09/23/2020 2:40:35 PM By: [id number] Entered By: [name] on 09/23/2020 12:56:19 [id number] ([id number]) -------------------------------------------------------------------------------- Multi Wound Chart Details Patient Name: 13/05/1930 Date of Service: 09/23/2020 12:30 PM Medical Record Number: Rodell Perna Patient Account Number: Aram Beecham Date of Birth/Sex: [birth year] (86 y.o. F) Treating RN: 09/25/2020 Primary Care Rosann Gorum: Rodell Perna Other Clinician: Referring Angelly Spearing: Rodell Perna Treating Christeena Krogh/Extender: 09/25/2020 in Treatment: 6 Vital Signs Height(in): 60 Pulse(bpm): 67 Weight(lbs): 125 Blood Pressure(mmHg): 132/67 Body Mass Index(BMI): 24 Temperature(F): 98.1 Respiratory Rate(breaths/min): 16 Photos: [N/A:N/A] Wound Location: Left Forearm N/A N/A Wounding Event: Trauma N/A N/A Primary Etiology: Trauma, Other N/A N/A Comorbid History: Cataracts, Glaucoma, Optic N/A N/A Neuritis, Chronic sinus problems/congestion, Middle ear problems, Hypertension, Rheumatoid Arthritis Date Acquired: 08/10/2020 N/A N/A Weeks of  Treatment: 6 N/A N/A Wound Status: Healed - Epithelialized N/A N/A Measurements L x W x D (cm) 0x0x0 N/A N/A Area (cm) : 0 N/A N/A Volume (cm) : 0 N/A N/A % Reduction in Area: 100.00% N/A N/A % Reduction in Volume: 100.00% N/A N/A Classification: Partial Thickness N/A N/A Exudate Amount: Medium N/A N/A Exudate Type: Serosanguineous N/A N/A Exudate Color: red, brown N/A N/A Wound Margin: Flat and Intact N/A N/A Granulation Amount: None Present (0%) N/A N/A Necrotic Amount: Large (  67-100%) N/A N/A Necrotic Tissue: Eschar N/A N/A Exposed Structures: Fat Layer (Subcutaneous Tissue): N/A N/A Yes Fascia: No Tendon: No Muscle: No Joint: No Bone: No Epithelialization: Medium (34-66%) N/A N/A Treatment Notes Electronic Signature(s) Signed: 09/23/2020 4:31:41 PM By: Baltazar Najjar MD Entered By: Baltazar Najjar on 09/23/2020 13:18:47 [name] ([id number]) -------------------------------------------------------------------------------- Multi-Disciplinary Care Plan Details Patient Name: [name] Date of Service: 09/23/2020 12:30 PM Medical Record Number: [id number] Patient Account Number: [id number] Date of Birth/Sex: 1930/09/27 (86 y.o. F) Treating RN: Huel Coventry Primary Care Norwood Quezada: Aram Beecham Other Clinician: Referring Chirstopher Iovino: Aram Beecham Treating Enos Muhl/Extender: Altamese [place] in Treatment: 6 Active Inactive Electronic Signature(s) Signed: 09/28/2020 10:00:34 AM By: Elliot Gurney, BSN, RN, CWS, Kim RN, BSN Entered By: Elliot Gurney, BSN, RN, CWS, Kim on 09/23/2020 13:12:10 [name] ([id number]) -------------------------------------------------------------------------------- Pain Assessment Details Patient Name: [name] Date of Service: 09/23/2020 12:30 PM Medical Record Number: [id number] Patient Account Number: [id number] Date of Birth/Sex: 1930/02/07 (86 y.o. F) Treating RN: Rodell Perna Primary Care Gale Klar: Aram Beecham Other  Clinician: Referring Alix Lahmann: Aram Beecham Treating Jenniefer Salak/Extender: Altamese [place] in Treatment: 6 Active Problems Location of Pain Severity and Description of Pain Patient Has Paino No Site Locations Pain Management and Medication Current Pain Management: Electronic Signature(s) Signed: 09/23/2020 2:40:35 PM By: Rodell Perna Entered By: Rodell Perna on 09/23/2020 12:54:14 [name] ([id number]) -------------------------------------------------------------------------------- Patient/Caregiver Education Details Patient Name: [name] Date of Service: 09/23/2020 12:30 PM Medical Record Number: [id number] Patient Account Number: [id number] Date of Birth/Gender: [birth year] (86 y.o. F) Treating RN: Huel Coventry Primary Care Physician: Aram Beecham Other Clinician: Referring Physician: Aram Beecham Treating Physician/Extender: Altamese [place] in Treatment: 6 Education Assessment Education Provided To: Patient Education Topics Provided Wound/Skin Impairment: Handouts: Caring for Your Ulcer, Other: treatment complete Methods: Demonstration, Explain/Verbal Responses: State content correctly Electronic Signature(s) Signed: 09/28/2020 10:00:34 AM By: Elliot Gurney, BSN, RN, CWS, Kim RN, BSN Entered By: Elliot Gurney, BSN, RN, CWS, Kim on 09/23/2020 13:17:40 [name] ([id number]) -------------------------------------------------------------------------------- Wound Assessment Details Patient Name: [name] Date of Service: 09/23/2020 12:30 PM Medical Record Number: [id number] Patient Account Number: [id number] Date of Birth/Sex: February 13, 1930 (86 y.o. F) Treating RN: Huel Coventry Primary Care Zi Newbury: Aram Beecham Other Clinician: Referring Yzabelle Calles: Aram Beecham Treating Ebubechukwu Jedlicka/Extender: Altamese [place] in Treatment: 6 Wound Status Wound Number: 12 Primary Trauma, Other Etiology: Wound Location: Left Forearm Wound Healed -  Epithelialized Wounding Event: Trauma Status: Date Acquired: 08/10/2020 Comorbid Cataracts, Glaucoma, Optic Neuritis, Chronic sinus Weeks Of Treatment: 6 History: problems/congestion, Middle ear problems, Hypertension, Clustered Wound: No Rheumatoid Arthritis Photos Wound Measurements Length: (cm) 0 Width: (cm) 0 Depth: (cm) 0 Area: (cm) 0 Volume: (cm) 0 % Reduction in Area: 100% % Reduction in Volume: 100% Epithelialization: Medium (34-66%) Tunneling: No Undermining: No Wound Description Classification: Partial Thickness Wound Margin: Flat and Intact Exudate Amount: Medium Exudate Type: Serosanguineous Exudate Color: red, brown Foul Odor After Cleansing: No Slough/Fibrino No Wound Bed Granulation Amount: None Present (0%) Exposed Structure Necrotic Amount: Large (67-100%) Fascia Exposed: No Necrotic Quality: Eschar Fat Layer (Subcutaneous Tissue) Exposed: Yes Tendon Exposed: No Muscle Exposed: No Joint Exposed: No Bone Exposed: No Electronic Signature(s) Signed: 09/28/2020 10:00:34 AM By: Elliot Gurney, BSN, RN, CWS, Kim RN, BSN Entered By: Elliot Gurney, BSN, RN, CWS, Kim on 09/23/2020 13:11:50 [name] ([id number]) -------------------------------------------------------------------------------- Vitals Details Patient Name: [name] Date of Service: 09/23/2020 12:30 PM Medical Record Number: [id number] Patient Account Number: [id number] Date of Birth/Sex: [birth year] (  86 y.o. F) Treating RN: Rodell Perna Primary Care Abdulloh Ullom: Aram Beecham Other Clinician: Referring Geoffry Bannister: Aram Beecham Treating Sharayah Renfrow/Extender: Altamese [place] in Treatment: 6 Vital Signs Time Taken: 12:53 Temperature (F): 98.1 Height (in): 60 Pulse (bpm): 67 Weight (lbs): 125 Respiratory Rate (breaths/min): 16 Body Mass Index (BMI): 24.4 Blood Pressure (mmHg): 132/67 Reference Range: 80 - 120 mg / dl Electronic Signature(s) Signed: 09/23/2020 2:40:35 PM By: Rodell Perna Entered By: Rodell Perna on 09/23/2020 12:54:07

## 2020-09-28 NOTE — Progress Notes (Signed)
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 for response 07/25/18-She is seen in follow-up evaluation for left elbow wound. The wound is stable with no overt evidence of infection. She has counseled with her rheumatologist. She is wanting to restart her methotrexate; a culture was obtained to rule out occult infection before starting her methotrexate. We will continue with Iodosorb/Iodoflex and she will follow-up next week. 08/01/18; this is a difficult wound over her left olecranon process. There  is been concerned about infection although cultures including one done last week were negative. Pending 3 weeks ago I gave her an empiric course of antibiotics. She is having a lot of rheumatologic pain in her hands with pain and stiffness. She wants to go on her weekly methotrexate and I think it would be reasonable to do so. We have been using Iodoflex 08/01/18; difficult wound over her left olecranon process. She started back on methotrexate last week because of rheumatologic pain in her hands. We have been using Iodoflex to try and clean out the wound bed. She has been approved for Graphix PL 08/15/18; 2 week follow-up. Difficult wound over her left olecranon process. Graphix PL #1 with collagen backing 08/22/18; one-week follow-up. Difficult wound over her left olecranon process. Graphix PL #2 08/29/18; no major improvement. Difficult wound over her left olecranon process. Still considerable undermining. Graphics PL #3 1 week follow-up. Graphix #4 09/12/18 graphics #5. Some improvement in wound area although the undermining superiorly still has not closed down as much as I would like 09/19/18; Graphix #6 I think there is improvement in the undermining from 7 to 9:00. Wound bed looks healthy. 09/26/18 Graffix #7 undermining is 0.5 cm maximally at roughly 8:00. From 12 to 7:00 the tissue is adherent which is a major improvement there is  some advancing skin from this side. 10/03/18; Graphix #8 no major changes from last week 10/10/18 Graffix #9 There are improvements. There appears to be granulation coming up to the surface here and there is a lot less undermining at 8:00. 10/17/18. Graffix #10; Dimensions are improved less undermining surface felt the but the wound is still open. Initially a surgical wound following a bursectomy 10/24/18; Graffix #11. This is really stalled over the last 2 weeks. If there is no further improvement this will be the last application.The final option for this difficult  area would be plastic surgery and will set up a consult with Dr. Marina Goodell in [facility] 10/31/18; wound looks about the same. The undermining superiorly is 0.7 cm. On the lateral edges perhaps some improvement there is no drainage. 11-07-2018 patient seen today for follow-up and management of left elbow wound. She has completed a total of 11 treatments of the graffix with not much improvement. She has an upcoming appointment with plastic surgery to assist with additional treatment options for the left elbow wound on 11/19/18. There is significant amount surrounding undermining of the wound is 0.9 cm. Currently prescribed methotrexate. Wound is being treated with Indoform and border dressing. No drainage from wound. No fever, chills. or pain. 11/21/18; the patient continues to have the wound looking roughly the same with undermining from about 12 to 6:00. This has not changed all that much. She does have skin irritation around the wound that looks like drainage maceration issues. The patient states that she was not able to have her wound dressing changed because of illness in the person he usually does this. She also did not attend her clinic appointment today with Dr. Marina Goodell because of transportation issues. She is rebooked for some time in mid January 11/28/2018; the patient has less undermining using endoform. As a understandings she saw Dr. Thad Ranger who is Dr. Lonni Fix partner. He recommended putting her in a elbow brace and I believe is written a prescription for it. He also recommended Motrin 800 mg 3 times daily. This is prescription strength ibuprofen although he did not write his prescription. This apparently was for 2 weeks. Culture I did last time grew a few methicillin sensitive staph aureus. After some difficulty due to drug intolerances/allergies and drug interactions I settled on a 5-day course of azithromycin 12/03/18 on evaluation today patient actually appears to be doing fairly  well in regard to her elbow when compared to last time I evaluated her. With that being said there does not appear to be any signs of infection at this time. That was the big concern currently as far as the patient was concerned. Nonetheless I do feel like she is making progress in regard to the feeling of this ulcer it has been slow. She did see a plastic surgeon Natasha, Barnett ([id number]) they are talking about putting her in a brace in order to allow this area to heal more appropriately. 12/19/2018; not much change in this from the last time I have saw this.'s much smaller area than when she first came in and with less circumferential undermining however this is never really adhered. She is wearing the brace that was given or prescribed to her by plastics. She did not have a procedure offered to attempt to close this. We have been using endoform 1/15; wound actually is not doing as well as last week. She was actually not supposed to come into this clinic again until next week but apparently her attendant noticed some  redness increasing pain and she came in early. She reports the same amount of drainage. We have been using endoform. She is approved through puraply however I will only consider starting that next week 1/22; she completed the antibiotics last week. Culture I did was negative. In spite of this there is less erythema and pain complaints in the wound. Puraply #1 applied today 1/29; Puraply #2 today. Wound surface looks a lot better post debridement of adherent fibrinous material. However undermining from 6-12 is measuring worse 2/5; Puraply #3. Using her elbow brace 2/12 puraply #4 2/19 puraply #5. The 9:00 undermining measured at 0.5 cm. Undermining from 4-11 o'clock. Surface of the wound looks better and the circumference of the wound is smaller however the undermining is not really changed 2/26; still not much improvement. She has undermining from 4-9 o'clock 0.9 cm. Surface of the  wound covered and adherent debris. 3/4; still no improvement. Undermining from 4-9 o'clock still around a centimeter. Surface of the wound looks somewhat better. No debridement is required we used endoform after we ended the trial of puraply last week 3/11; really no improvement at all. Still 1 cm undermining from roughly 9-3 o'clock. This is about a centimeter. The base of the wound looks fairly healthy. No debridement. We have been using endoform. I am really out of most usual options here. I could consider either another round of an amniotic advanced treatment product example epifix or perhaps regranex. Understandably the patient is a bit frustrated. We did send her to plastic surgery for a consult. Other than prescribing her a brace to immobilize the elbow they did not think she was a candidate for any further surgery. Notable that the patient is not using the brace today 3/18-Patient returns for attention to the left elbow area which apparently looked red at the home health visit. Patient's elbow looks the same if not better compared with last visit. The area of ulceration remains the same, the base appears healthy. We are continuing to use endoform she has been encouraged to use the brace to keep the elbow straight 3/25; the patient has an appointment at the [facility] wound care center on 4/2. I had actually put her out indefinitely however she seems to want to come back here every week. This week she complains of increased pain and malodor. Dimensions of the elbow wound are larger. We have been using endoform 4/8; the patient went to [facility] where they apparently gave her meta honey and some border foam with a Tubigrip. She has been using this for a week. She says she was very impressed with them there. They did not offer her any surgical consultation. She seems to be coming back here for a second opinion on this, she does not wish to drive to [facility] every week 4/09; still using Medihoney foam border  and a Tubigrip. Actually do not think she has anything on the arm at all specifically she is not using her brace 5/6; she has been using medihoney without a lot of improvement. Undermining maximum at 9:00 at 0.5 cm may be somewhat better. She is still complaining of discomfort. She uses her elbow brace at night but is not using anything on the arm during the day, she finds it too restrictive 5/13; we switch the patient to endoform AG last week. She is complaining of more pain and worried about some circumferential erythema around the wound. I had planned to consider epifix in this wound however the patient came in with a  request to see a plastic surgeon in [place] by the name of Dr. Wyline Mood who cared for a friend of hers. Noteworthy that I have already sent her to one plastic surgeon and she went for another second opinion at [facility] and they apparently did not send her to a plastic surgeon nevertheless the patient is fairly convinced that she might benefit from a skin graft which I am doubtful. She also has rheumatoid arthritis and is on methotrexate. This was originally a surgical wound for a bursectomy a year or 2 ago Readmission: 06/28/19 on evaluation today patient presents today for reevaluation but this is due to a new issue her elbow has completely healed and looks excellent. Her right anterior lower leg has a skin tear which was sustained from her dog who jumped up on her and inadvertently scratch the area causing the skin tear. This happened yesterday. She is having some discomfort but fortunately nothing too significant which is good news. No fevers, chills, nausea, or vomiting noted at this time. The skin fortunately was knocked one completely off and we are gonna see about we approximate in the skin as best we can in using Steri-Strips to hold this in place obviously if we can get some of this to reattach that would be beneficial 07/05/2019 on evaluation today patient actually appears to  be doing okay although unfortunately the skin flap that we were attempting to Steri- Stripped down last week did not take. She is developing a lot of fluid underneath the wound area unfortunately which again is not ideal. I think that this necrotic tissue needs to be removed and again it actually just wiped off during the evaluation today as I was attempting to clean the wound there did not appear to be any significant issues underlying which is good although there was some purulent drainage I did want to go ahead and see about obtaining a culture from today in order to ensure that the Z-Pak that I placed her on earlier in the week was appropriate for treating what ever infection may be causing the issue currently. This was a deep wound culture obtained today. 07/12/2019 on evaluation today patient actually appears to be doing quite well with regard to her right lower extremity ulcer all things considering. There is still little bit of hematoma not it that is noted in the central portion of the wound along with some necrotic tissue but she is still having quite a bit of discomfort. For that reason I did not perform sharp debridement today although this wound does need some debridement of one type or another. I think we may attempt Iodoflex to see if this can be of benefit. Fortunately there is no signs of active infection at this point. 07/18/19 upon evaluation today patient appears to be doing better with regard to her ulcer on her right lower extremity. She's been tolerating the dressing changes without complication. The good news is she seems to be making excellent progress. Overall I'm pleased with there being no signs of infection. With that being said she does tell me that she's been having some discomfort with the wrap she's unsure of exactly what about the rafters causing her trouble. 07/25/2019 on evaluation today patient appears to be doing a little better in regard to her lower extremity ulcer.  Unfortunately the alginate seems to be getting really stuck to the wound bed and surrounding periwound. Fortunately there is no signs of active infection at this time. There does not appear to be  any evidence of infection currently. 08/01/2019 on evaluation today patient actually appears to be doing much better with regard to her right lower extremity ulcer. She has been tolerating the dressing changes without complication. Fortunately her wound does not show any signs of infection and seems to be making good progress. No fevers, chills, nausea, vomiting, or diarrhea. 08/08/2019 on evaluation today patient actually appears to be doing better with regard to her leg ulcer. She has been tolerating the dressing changes without complication. With that being said I think we may want to switch the dressing up today just based on the appearance of the wound bed in general. Fortunately there is no evidence of infection currently. Her pain seems to be much better. Her son is present with her Natasha, Barnett ([id number]) today. 08/15/2019 on evaluation today patient appears to be doing very well with regard to her right lower extremity ulcer. She still complains about feeling like the compression stocking/Tubigrip is too tight for her. Nonetheless I still think this is beneficial and is helping the wound to heal more effectively. Overall I am extremely pleased at this time with what I am seeing. 08/22/2019 on evaluation today patient actually appears to be doing quite well with regard to her right lower extremity. She has been tolerating the dressing changes without complication. Fortunately there is no signs of active infection at this time. She still complains about the Tubigrip but nonetheless I think this is something that is of utmost importance for her to continue to wear if she is going to see this area heal appropriately. 08/29/2019 on evaluation today patient actually appears to be doing well visually in  regard to her wound. Unfortunately in regard to overall pain she seems to be having more pain today which I am somewhat concerned about. There does not appear to be any signs of active infection that I can tell but again with increased pain that is definitely a concern here today. I do think it may be time to switch up the dressing as well to something that will be a little bit more effective hopefully and new tissue growth she has done well with collagen in the past. 09/05/2019 on evaluation today patient actually appears to be doing well with regard to her leg ulcer. She has been tolerating the dressing changes without complication. Fortunately there is no signs of active infection. We did obtain a wound culture last week due to the fact that she still is very touchy as far as the surface of the wound is concerned. Unfortunately I am not sure what happened to that culture as there is no record in the computer system. Nonetheless we need to follow-up on this and try to see if we can identify what exactly happened with this specimen as we have not gotten a result back either obviously. 09/12/2019 on evaluation today patient actually appears to be showing signs of good improvement upon evaluation today. She states her pain is not nearly as bad as what it was which is also good news. She did have a wound culture which was reviewed today that showed no growth of bacteria which is good news. This coupled with the fact that her wound appears to be healing better, measuring smaller, and overall even the slough buildup is minimal compared to what we have seen previously, I feel like she is actually showing signs of excellent improvement today. 09/19/2019 on evaluation today patient appears to be doing well with regard to her wounds on evaluation  today. She is showing signs of continued improvement week by week and everything is measuring significantly smaller which is great news. Overall very pleased with the  progress that she has made. 09/26/2019 on evaluation today patient appears to be doing fairly well with regard to her right lower extremity ulcer. She does continue to have some drainage and again the smaller of the 2 wounds is not making as much progress as I like to see. We have been doing the silver collagen for some time I think we may want to switch to [facility] she has a little bit of hyper granular tissue this may help to keep things from becoming too dramatic in that regard. Fortunately there is no signs of infection at this time. 10/03/2019 on evaluation today patient appears to be doing fairly well with regard to her lower extremity ulcers at this time. She has been tolerating the dressing changes without complication. Fortunately the wound is measuring smaller which is great news she continues to make great progress. 10/17/2019 on evaluation today patient appears to be doing well with regard to her wounds of her right lower extremity. She states she has been having some increased pain apparently we gave her a little bit smaller size Tubigrip last time she was here this may have been what was causing some issues. She is out of the Tubigrip that we had ordered for her and therefore did not have any of the smaller size. Fortunately there is no evidence of active infection at this time. No fever chills noted 10/24/2019 upon evaluation today patient's wounds actually appear to be showing signs of good improvement which is excellent news. She has been tolerating the dressing changes without complication and overall I am rather pleased at this point with how things seem to be progressing. The patient likewise is very happy. She still was not happy with having to use the Tubigrip she states that bunches up in her shoe. 10/31/2019 on evaluation today patient actually appears to be doing quite well with regard to her leg ulcer. She has been tolerating the dressing changes without complication.  Fortunately there is no signs of active infection at this time. Overall I am very pleased with the progress that she is made. She does wonder if we could switch to the bordered adhesive Hydrofera Blue I think that something we can definitely look into for her. 11/15/2019 patient on evaluation today appears to be making excellent progress. Her wound is significantly smaller and the wound bed appears to be dramatically improved compared to even the last visit. Fortunately she is having really little to no pain at this time. No fevers, chills, nausea, vomiting, or diarrhea. 11/25/2019 on evaluation today patient appears to be doing well with regard to her lower extremity ulcer. She has been tolerating dressing changes without complication. Fortunately there is no signs of active infection at this time. No fevers, chills, nausea, vomiting, or diarrhea. 12/02/2019 on evaluation today patient unfortunately is not doing quite as well as what we would have liked to have seen based on last week's evaluation. Fortunately there is no signs of active infection at this time. With that being said she is continuing to experience some issues with hyper granulation she was doing much better with the [facility] prior to putting the contact layer in between her skin and the [facility] I think that we need to go back to that. She just can have to be very careful with removing the dressing in order  to ensure it does not pull skin and get stuck. 12/16/2019 on evaluation today patient appears to be doing better her wound is measuring significantly smaller at this time compared to where things were previous. Fortunately there is no evidence of active infection at this point which is also good news. No fever chills noted. 12/23/2019 upon evaluation today patient appears to be doing excellent in regard to her lower extremity ulcer. She has been tolerating the dressing changes without complication. Fortunately there is no  signs of active infection at this time. Overall I am very pleased with the fact that she is doing so well today. READMISSION 08/12/2020 Mrs. Cosma is a now 86 year old woman we have had in this clinic on at least 2 occasions. She spent a protracted period of time here for a postsurgical wound on her left olecranon and then more recently towards the end of last year earlier this year was in for a skin tear on her left lower leg caused by her dog. She comes in today with a reasonably acute area on her left forearm again apparently caused by her dog although it was not a bite injury. She was seen in urgent care and they managed to reoppose a lot of the skin tear although the patient states that is now reopened slightly. They have been using Neosporin. She has very fragile skin probably a combination of chronic steroid use for rheumatoid Natasha, Barnett ([id number]) arthritis [although I do not see that she is on that now] solar skin damage. I note that she takes methotrexate and Plaquenil for her rheumatoid arthritis 9/15; this patient had a skin tear on her left forearm from trauma with her dog. She was seen in urgent care and they reapposed most of the separated skin. Fortunately for her most of this is remained intact and the skin above and below the wound actually looks healthy. Where there was a separation and the 2 flaps of skin there is still an open area however the granulation tissue looks healthy here. We have been using Xeroform however I had like to change to [facility] 9/29; skin tear on her left arm from a dog scratch. The skin was reapposed in an urgent care and surprisingly stayed viable. We used Hydrofera Blue last time the wound is smaller 10/13; patient comes back to clinic after 2 weeks for a left arm wound secondary to a dog scratch. The skin was reapposed and in urgent care. We have been using Hydrofera Blue and the wound is closed today. Objective Constitutional Sitting or  standing Blood Pressure is within target range for patient.. Pulse regular and within target range for patient.Marland Kitchen Respirations regular, non- labored and within target range.. Temperature is normal and within the target range for the patient.Marland Kitchen appears in no distress. Vitals Time Taken: 12:53 PM, Height: 60 in, Weight: 125 lbs, BMI: 24.4, Temperature: 98.1 F, Pulse: 67 bpm, Respiratory Rate: 16 breaths/min, Blood Pressure: 132/67 mmHg. General Notes: Wound exam; left forearm. She came in with some eschar over the wounded area. I remove this with a #5 curette there is nothing but healthy epithelialization here. No evidence of surrounding infection. Radial pulse was palpable Integumentary (Hair, Skin) Wound #12 status is Healed - Epithelialized. Original cause of wound was Trauma. The wound is located on the Left Forearm. The wound measures 0cm length x 0cm width x 0cm depth; 0cm^2 area and 0cm^3 volume. There is Fat Layer (Subcutaneous Tissue) exposed. There is no tunneling or undermining noted. There  is a medium amount of serosanguineous drainage noted. The wound margin is flat and intact. There is no granulation within the wound bed. There is a large (67-100%) amount of necrotic tissue within the wound bed including Eschar. Assessment Active Problems ICD-10 Abrasion of left forearm, subsequent encounter Struck by dog, initial encounter Plan Discharge From [facility]: Discharge from Wound Care Center - Completed treatment 1. The patient can be discharged from the wound care center treatment is complete. Electronic Signature(s) Signed: 09/23/2020 4:31:41 PM By: Baltazar Najjar MD Entered By: Baltazar Najjar on 09/23/2020 13:24:04 [name] ([id number]) -------------------------------------------------------------------------------- SuperBill Details Patient Name: [name] Date of Service: 09/23/2020 Medical Record Number: [id number] Patient Account Number: [id number] Date of  Birth/Sex: 02-20-1930 (86 y.o. F) Treating RN: Huel Coventry Primary Care Provider: Aram Beecham Other Clinician: Referring Provider: Aram Beecham Treating Provider/Extender: [place] in Treatment: 6 Diagnosis Coding ICD-10 Codes Code Description [phone] Abrasion of left forearm, subsequent encounter W54.1XXA Struck by dog, initial encounter Facility Procedures CPT4 Code: [id number] Description: [phone] - WOUND CARE VISIT-LEV 2 EST PT Modifier: Quantity: 1 Physician Procedures CPT4 Code: [id number] Description: [phone] - WC PHYS LEVEL 2 - EST PT Modifier: Quantity: 1 CPT4 Code: Description: ICD-10 Diagnosis Description S50.812D Abrasion of left forearm, subsequent encounter Modifier: Quantity: Electronic Signature(s) Signed: 09/23/2020 4:31:41 PM By: Baltazar Najjar MD Entered By: Baltazar Najjar on 09/23/2020 13:24:25

## 2020-10-02 NOTE — Progress Notes (Signed)
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 there is considerable undermining relative to the wound size. She is on methotrexate for rheumatoid arthritis/overlap syndrome also plaquenil. We've been using silver collagen 07/18/18-She is seen in follow-up evaluation for a left elbow wound. There is essentially no change. She is currently on Zithromax and will complete that on Friday, there is no indication to extend this. We will change to iodosorb/iodoflex and monitor for  response 07/25/18-She is seen in follow-up evaluation for left elbow wound. The wound is stable with no overt evidence of infection. She has counseled with her rheumatologist. She is wanting to restart her methotrexate; a culture was obtained to rule out occult infection before starting her methotrexate. We will continue with Iodosorb/Iodoflex and she will follow-up next week. 08/01/18; this is a difficult wound over her left olecranon process. There is been concerned about infection although cultures including one done last week were negative. Pending 3 weeks ago I gave her an empiric course of antibiotics. She is having a lot of rheumatologic pain in her hands with pain and stiffness. She wants to go on her weekly methotrexate and I think it would be reasonable to do so. We have been using Iodoflex 08/01/18; difficult wound over her left olecranon process. She started back on methotrexate last week because of rheumatologic pain in her hands. We have been using Iodoflex to try and clean out the wound bed. She has been approved for Graphix PL 08/15/18; 2 week follow-up. Difficult wound over her left olecranon process. Graphix PL #1 with collagen backing 08/22/18; one-week follow-up. Difficult wound over her left olecranon process. Graphix PL #2 08/29/18; no major improvement. Difficult wound over her left olecranon process. Still considerable undermining. Graphics PL #3 1 week follow-up. Graphix #4 09/12/18 graphics #5. Some improvement in wound  area although the undermining superiorly still has not closed down as much as I would like 09/19/18; Graphix #6 I think there is improvement in the undermining from 7 to 9:00. Wound bed looks healthy. 09/26/18 Graffix #7 undermining is 0.5 cm maximally at roughly 8:00. From 12 to 7:00 the tissue is adherent which is a major improvement there is some advancing skin from this side. 10/03/18; Graphix #8 no major changes from last week 10/10/18 Graffix #9 There are  improvements. There appears to be granulation coming up to the surface here and there is a lot less undermining at 8:00. 10/17/18. Graffix #10; Dimensions are improved less undermining surface felt the but the wound is still open. Initially a surgical wound following a bursectomy 10/24/18; Graffix #11. This is really stalled over the last 2 weeks. If there is no further improvement this will be the last application.The final option for this difficult area would be plastic surgery and will set up a consult with Dr. Marina Goodell in [facility] 10/31/18; wound looks about the same. The undermining superiorly is 0.7 cm. On the lateral edges perhaps some improvement there is no drainage. 11-07-2018 patient seen today for follow-up and management of left elbow wound. She has completed a total of 11 treatments of the graffix with not much improvement. She has an upcoming appointment with plastic surgery to assist with additional treatment options for the left elbow wound on 11/19/18. There is significant amount surrounding undermining of the wound is 0.9 cm. Currently prescribed methotrexate. Wound is being treated with Indoform and border dressing. No drainage from wound. No fever, chills. or pain. 11/21/18; the patient continues to have the wound looking roughly the same with undermining from about 12 to 6:00. This has not changed all that much. She does have skin irritation around the wound that looks like drainage maceration issues. The patient states that she was not able to have her wound dressing changed because of illness in the person he usually does this. She also did not attend her clinic appointment today with Dr. Marina Goodell because of transportation issues. She is rebooked for some time in mid January 11/28/2018; the patient has less undermining using endoform. As a understandings she saw Dr. Thad Ranger who is Dr. Lonni Fix partner. He recommended putting her in a elbow brace and I believe is written a  prescription for it. He also recommended Motrin 800 mg 3 times daily. This is prescription strength ibuprofen although he did not write his prescription. This apparently was for 2 weeks. Culture I did last time grew a few methicillin sensitive staph aureus. After some difficulty due to drug intolerances/allergies and drug interactions I settled on a 5-day course of azithromycin 12/03/18 on evaluation today patient actually appears to be doing fairly well in regard to her elbow when compared to last time I evaluated her. With that being said there does not appear to be any signs of infection at this time. That was the big concern currently as far as the patient was concerned. Nonetheless I do feel like she is making progress in regard to the feeling of this ulcer it has been slow. She did see a plastic surgeon [name], [name] ([id number]) they are talking about putting her in a brace in order to allow this area to heal more appropriately. 12/19/2018; not much change in this from the last time I have saw this.'s much smaller area than when she first came in and with less circumferential undermining however this is never really adhered. She  is wearing the brace that was given or prescribed to her by plastics. She did not have a procedure offered to attempt to close this. We have been using endoform 1/15; wound actually is not doing as well as last week. She was actually not supposed to come into this clinic again until next week but apparently her attendant noticed some redness increasing pain and she came in early. She reports the same amount of drainage. We have been using endoform. She is approved through puraply however I will only consider starting that next week 1/22; she completed the antibiotics last week. Culture I did was negative. In spite of this there is less erythema and pain complaints in the wound. Puraply #1 applied today 1/29; Puraply #2 today. Wound surface looks a lot better post  debridement of adherent fibrinous material. However undermining from 6-12 is measuring worse 2/5; Puraply #3. Using her elbow brace 2/12 puraply #4 2/19 puraply #5. The 9:00 undermining measured at 0.5 cm. Undermining from 4-11 o'clock. Surface of the wound looks better and the circumference of the wound is smaller however the undermining is not really changed 2/26; still not much improvement. She has undermining from 4-9 o'clock 0.9 cm. Surface of the wound covered and adherent debris. 3/4; still no improvement. Undermining from 4-9 o'clock still around a centimeter. Surface of the wound looks somewhat better. No debridement is required we used endoform after we ended the trial of puraply last week 3/11; really no improvement at all. Still 1 cm undermining from roughly 9-3 o'clock. This is about a centimeter. The base of the wound looks fairly healthy. No debridement. We have been using endoform. I am really out of most usual options here. I could consider either another round of an amniotic advanced treatment product example epifix or perhaps regranex. Understandably the patient is a bit frustrated. We did send her to plastic surgery for a consult. Other than prescribing her a brace to immobilize the elbow they did not think she was a candidate for any further surgery. Notable that the patient is not using the brace today 3/18-Patient returns for attention to the left elbow area which apparently looked red at the home health visit. Patient's elbow looks the same if not better compared with last visit. The area of ulceration remains the same, the base appears healthy. We are continuing to use endoform she has been encouraged to use the brace to keep the elbow straight 3/25; the patient has an appointment at the [facility] wound care center on 4/2. I had actually put her out indefinitely however she seems to want to come back here every week. This week she complains of increased pain and malodor.  Dimensions of the elbow wound are larger. We have been using endoform 4/8; the patient went to [facility] where they apparently gave her meta honey and some border foam with a Tubigrip. She has been using this for a week. She says she was very impressed with them there. They did not offer her any surgical consultation. She seems to be coming back here for a second opinion on this, she does not wish to drive to [facility] every week 9/56; still using Medihoney foam border and a Tubigrip. Actually do not think she has anything on the arm at all specifically she is not using her brace 5/6; she has been using medihoney without a lot of improvement. Undermining maximum at 9:00 at 0.5 cm may be somewhat better. She is still complaining of discomfort. She uses her  elbow brace at night but is not using anything on the arm during the day, she finds it too restrictive 5/13; we switch the patient to endoform AG last week. She is complaining of more pain and worried about some circumferential erythema around the wound. I had planned to consider epifix in this wound however the patient came in with a request to see a plastic surgeon in [place] by the name of Dr. Wyline Mood who cared for a friend of hers. Noteworthy that I have already sent her to one plastic surgeon and she went for another second opinion at [facility] and they apparently did not send her to a plastic surgeon nevertheless the patient is fairly convinced that she might benefit from a skin graft which I am doubtful. She also has rheumatoid arthritis and is on methotrexate. This was originally a surgical wound for a bursectomy a year or 2 ago Readmission: 06/28/19 on evaluation today patient presents today for reevaluation but this is due to a new issue her elbow has completely healed and looks excellent. Her right anterior lower leg has a skin tear which was sustained from her dog who jumped up on her and inadvertently scratch the area causing the skin tear. This  happened yesterday. She is having some discomfort but fortunately nothing too significant which is good news. No fevers, chills, nausea, or vomiting noted at this time. The skin fortunately was knocked one completely off and we are gonna see about we approximate in the skin as best we can in using Steri-Strips to hold this in place obviously if we can get some of this to reattach that would be beneficial 07/05/2019 on evaluation today patient actually appears to be doing okay although unfortunately the skin flap that we were attempting to Steri- Stripped down last week did not take. She is developing a lot of fluid underneath the wound area unfortunately which again is not ideal. I think that this necrotic tissue needs to be removed and again it actually just wiped off during the evaluation today as I was attempting to clean the wound there did not appear to be any significant issues underlying which is good although there was some purulent drainage I did want to go ahead and see about obtaining a culture from today in order to ensure that the Z-Pak that I placed her on earlier in the week was appropriate for treating what ever infection may be causing the issue currently. This was a deep wound culture obtained today. 07/12/2019 on evaluation today patient actually appears to be doing quite well with regard to her right lower extremity ulcer all things considering. There is still little bit of hematoma not it that is noted in the central portion of the wound along with some necrotic tissue but she is still having quite a bit of discomfort. For that reason I did not perform sharp debridement today although this wound does need some debridement of one type or another. I think we may attempt Iodoflex to see if this can be of benefit. Fortunately there is no signs of active infection at this point. 07/18/19 upon evaluation today patient appears to be doing better with regard to her ulcer on her right lower  extremity. She's been tolerating the dressing changes without complication. The good news is she seems to be making excellent progress. Overall I'm pleased with there being no signs of infection. With that being said she does tell me that she's been having some discomfort with the wrap she's unsure  of exactly what about the rafters causing her trouble. 07/25/2019 on evaluation today patient appears to be doing a little better in regard to her lower extremity ulcer. Unfortunately the alginate seems to be getting really stuck to the wound bed and surrounding periwound. Fortunately there is no signs of active infection at this time. There does not appear to be any evidence of infection currently. 08/01/2019 on evaluation today patient actually appears to be doing much better with regard to her right lower extremity ulcer. She has been tolerating the dressing changes without complication. Fortunately her wound does not show any signs of infection and seems to be making good progress. No fevers, chills, nausea, vomiting, or diarrhea. 08/08/2019 on evaluation today patient actually appears to be doing better with regard to her leg ulcer. She has been tolerating the dressing changes without complication. With that being said I think we may want to switch the dressing up today just based on the appearance of the wound bed in general. Fortunately there is no evidence of infection currently. Her pain seems to be much better. Her son is present with her MERRILYN, LEGLER ([id number]) today. 08/15/2019 on evaluation today patient appears to be doing very well with regard to her right lower extremity ulcer. She still complains about feeling like the compression stocking/Tubigrip is too tight for her. Nonetheless I still think this is beneficial and is helping the wound to heal more effectively. Overall I am extremely pleased at this time with what I am seeing. 08/22/2019 on evaluation today patient actually appears to  be doing quite well with regard to her right lower extremity. She has been tolerating the dressing changes without complication. Fortunately there is no signs of active infection at this time. She still complains about the Tubigrip but nonetheless I think this is something that is of utmost importance for her to continue to wear if she is going to see this area heal appropriately. 08/29/2019 on evaluation today patient actually appears to be doing well visually in regard to her wound. Unfortunately in regard to overall pain she seems to be having more pain today which I am somewhat concerned about. There does not appear to be any signs of active infection that I can tell but again with increased pain that is definitely a concern here today. I do think it may be time to switch up the dressing as well to something that will be a little bit more effective hopefully and new tissue growth she has done well with collagen in the past. 09/05/2019 on evaluation today patient actually appears to be doing well with regard to her leg ulcer. She has been tolerating the dressing changes without complication. Fortunately there is no signs of active infection. We did obtain a wound culture last week due to the fact that she still is very touchy as far as the surface of the wound is concerned. Unfortunately I am not sure what happened to that culture as there is no record in the computer system. Nonetheless we need to follow-up on this and try to see if we can identify what exactly happened with this specimen as we have not gotten a result back either obviously. 09/12/2019 on evaluation today patient actually appears to be showing signs of good improvement upon evaluation today. She states her pain is not nearly as bad as what it was which is also good news. She did have a wound culture which was reviewed today that showed no growth of bacteria which is  good news. This coupled with the fact that her wound appears to be  healing better, measuring smaller, and overall even the slough buildup is minimal compared to what we have seen previously, I feel like she is actually showing signs of excellent improvement today. 09/19/2019 on evaluation today patient appears to be doing well with regard to her wounds on evaluation today. She is showing signs of continued improvement week by week and everything is measuring significantly smaller which is great news. Overall very pleased with the progress that she has made. 09/26/2019 on evaluation today patient appears to be doing fairly well with regard to her right lower extremity ulcer. She does continue to have some drainage and again the smaller of the 2 wounds is not making as much progress as I like to see. We have been doing the silver collagen for some time I think we may want to switch to [facility] she has a little bit of hyper granular tissue this may help to keep things from becoming too dramatic in that regard. Fortunately there is no signs of infection at this time. 10/03/2019 on evaluation today patient appears to be doing fairly well with regard to her lower extremity ulcers at this time. She has been tolerating the dressing changes without complication. Fortunately the wound is measuring smaller which is great news she continues to make great progress. 10/17/2019 on evaluation today patient appears to be doing well with regard to her wounds of her right lower extremity. She states she has been having some increased pain apparently we gave her a little bit smaller size Tubigrip last time she was here this may have been what was causing some issues. She is out of the Tubigrip that we had ordered for her and therefore did not have any of the smaller size. Fortunately there is no evidence of active infection at this time. No fever chills noted 10/24/2019 upon evaluation today patient's wounds actually appear to be showing signs of good improvement which is excellent  news. She has been tolerating the dressing changes without complication and overall I am rather pleased at this point with how things seem to be progressing. The patient likewise is very happy. She still was not happy with having to use the Tubigrip she states that bunches up in her shoe. 10/31/2019 on evaluation today patient actually appears to be doing quite well with regard to her leg ulcer. She has been tolerating the dressing changes without complication. Fortunately there is no signs of active infection at this time. Overall I am very pleased with the progress that she is made. She does wonder if we could switch to the bordered adhesive Hydrofera Blue I think that something we can definitely look into for her. 11/15/2019 patient on evaluation today appears to be making excellent progress. Her wound is significantly smaller and the wound bed appears to be dramatically improved compared to even the last visit. Fortunately she is having really little to no pain at this time. No fevers, chills, nausea, vomiting, or diarrhea. 11/25/2019 on evaluation today patient appears to be doing well with regard to her lower extremity ulcer. She has been tolerating dressing changes without complication. Fortunately there is no signs of active infection at this time. No fevers, chills, nausea, vomiting, or diarrhea. 12/02/2019 on evaluation today patient unfortunately is not doing quite as well as what we would have liked to have seen based on last week's evaluation. Fortunately there is no signs of active infection at this time.  With that being said she is continuing to experience some issues with hyper granulation she was doing much better with the [facility] prior to putting the contact layer in between her skin and the [facility] I think that we need to go back to that. She just can have to be very careful with removing the dressing in order to ensure it does not pull skin and get stuck. 12/16/2019 on  evaluation today patient appears to be doing better her wound is measuring significantly smaller at this time compared to where things were previous. Fortunately there is no evidence of active infection at this point which is also good news. No fever chills noted. 12/23/2019 upon evaluation today patient appears to be doing excellent in regard to her lower extremity ulcer. She has been tolerating the dressing changes without complication. Fortunately there is no signs of active infection at this time. Overall I am very pleased with the fact that she is doing so well today. READMISSION 08/12/2020 [name] is a now 86 year old woman we have had in this clinic on at least 2 occasions. She spent a protracted period of time here for a postsurgical wound on her left olecranon and then more recently towards the end of last year earlier this year was in for a skin tear on her left lower leg caused by her dog. She comes in today with a reasonably acute area on her left forearm again apparently caused by her dog although it was not a bite injury. She was seen in urgent care and they managed to reoppose a lot of the skin tear although the patient states that is now reopened slightly. They have been using Neosporin. She has very fragile skin probably a combination of chronic steroid use for rheumatoid [name], [name] ([id number]) arthritis [although I do not see that she is on that now] solar skin damage. I note that she takes methotrexate and Plaquenil for her rheumatoid arthritis 9/15; this patient had a skin tear on her left forearm from trauma with her dog. She was seen in urgent care and they reapposed most of the separated skin. Fortunately for her most of this is remained intact and the skin above and below the wound actually looks healthy. Where there was a separation and the 2 flaps of skin there is still an open area however the granulation tissue looks healthy here. We have been using Xeroform  however I had like to change to Hydrofera Blue Objective Constitutional Sitting or standing Blood Pressure is within target range for patient.. Pulse regular and within target range for patient.Marland Barnett Respirations regular, non- labored and within target range.. Temperature is normal and within the target range for the patient.Marland Barnett appears in no distress. Vitals Time Taken: 12:55 PM, Height: 60 in, Weight: 125 lbs, BMI: 24.4, Temperature: 98.1 F, Pulse: 69 bpm, Respiratory Rate: 16 breaths/min, Blood Pressure: 142/55 mmHg. General Notes: Wound exam; left forearm there is a small horizontal wound left. The inferior skin has remained viable as is the superior. I have explained to her that sometimes this is not the case and the patient is left with a much larger wound area. Her wound actually has clean granulation tissue there is no evidence of surrounding infection however she has skin changes I think from chronic steroid use perhaps chronic solar sun exposure. There is no evidence of significant vascular compromise no edema in the arm. Integumentary (Hair, Skin) Wound #12 status is Open. Original cause of wound was Trauma. The wound  is located on the Left Forearm. The wound measures 1cm length x 3.5cm width x 0.1cm depth; 2.749cm^2 area and 0.275cm^3 volume. There is Fat Layer (Subcutaneous Tissue) exposed. There is a medium amount of serosanguineous drainage noted. The wound margin is flat and intact. There is no granulation within the wound bed. There is no necrotic tissue within the wound bed. Assessment Active Problems ICD-10 Abrasion of left forearm, subsequent encounter Struck by dog, initial encounter Plan Wound Cleansing: Wound #12 Left Forearm: Dial antibacterial soap, wash wounds, rinse and pat dry prior to dressing wounds Anesthetic (add to Medication List): Wound #12 Left Forearm: Topical Lidocaine 4% cream applied to wound bed prior to debridement (In Clinic Only). Primary Wound  Dressing: Wound #12 Left Forearm: Hydrafera Blue Ready Transfer Secondary Dressing: Wound #12 Left Forearm: ABD and Kerlix/Conform - Stretch net to secure Dressing Change Frequency: Wound #12 Left Forearm: Change Dressing Monday, Wednesday, Friday Follow-up Appointments: Wound #12 Left Forearm: Return Appointment in 2 weeks. [name], [name] ([id number]) 1. The wound appears healthy. No mechanical debridement is required 2. I have change the dressing from Xeroform to Hydrofera Blue to help with final epithelialization 3. She has a small rim of skin at the inferior margin of the wound which may require removal if this is not fully epithelialized. 4. Fortunately the large area of the skin tear remained viable after the provider in urgent care reapposed this 5. No evidence of surrounding infection Electronic Signature(s) Signed: 08/27/2020 4:37:14 PM By: Baltazar Najjar MD Entered By: Baltazar Najjar on 08/26/2020 13:46:31 [name] ([id number]) -------------------------------------------------------------------------------- SuperBill Details Patient Name: [name] Date of Service: 08/26/2020 Medical Record Number: [id number] Patient Account Number: [id number] Date of Birth/Sex: 1930/08/11 (86 y.o. F) Treating RN: Huel Coventry Primary Care Provider: Aram Beecham Other Clinician: Referring Provider: Aram Beecham Treating Provider/Extender: Altamese [place] in Treatment: 2 Diagnosis Coding ICD-10 Codes Code Description [phone] Abrasion of left forearm, subsequent encounter W54.1XXA Struck by dog, initial encounter Facility Procedures CPT4 Code: [id number] Description: [phone] - WOUND CARE VISIT-LEV 3 EST PT Modifier: Quantity: 1 Physician Procedures CPT4 Code: [id number] Description: 99213 - WC PHYS LEVEL 3 - EST PT Modifier: Quantity: 1 CPT4 Code: Description: ICD-10 Diagnosis Description S50.812D Abrasion of left forearm, subsequent  encounter Modifier: Quantity: Electronic Signature(s) Signed: 08/27/2020 4:37:14 PM By: Baltazar Najjar MD Entered By: Baltazar Najjar on 08/26/2020 13:47:01

## 2020-10-02 NOTE — Progress Notes (Signed)
RAYA, MCKINSTRY ([id number]) Visit Report for 08/26/2020 Arrival Information Details Patient Name: [name], [name] Date of Service: 08/26/2020 1:00 PM Medical Record Number: [id number] Patient Account Number: [id number] Date of Birth/Sex: [birth year] (86 y.o. F) Treating RN: Huel Coventry Primary Care Christinna Sprung: Aram Beecham Other Clinician: Referring Elian Gloster: Aram Beecham Treating Kyler Germer/Extender: Altamese [place] in Treatment: 2 Visit Information History Since Last Visit Added or deleted any medications: No Patient Arrived: Wheel Chair Any new allergies or adverse reactions: No Arrival Time: 12:54 Had a fall or experienced change in No Accompanied By: friend - Nettie Elm activities of daily living that may affect Transfer Assistance: None risk of falls: Patient Identification Verified: Yes Signs or symptoms of abuse/neglect since last visito No Secondary Verification Process Completed: Yes Hospitalized since last visit: No Implantable device outside of the clinic excluding No cellular tissue based products placed in the center since last visit: Has Dressing in Place as Prescribed: Yes Pain Present Now: No Electronic Signature(s) Signed: 08/26/2020 4:20:26 PM By: Dayton Martes RCP, RRT, CHT Entered By: Dayton Martes on 08/26/2020 12:59:57 [name] ([id number]) -------------------------------------------------------------------------------- Clinic Level of Care Assessment Details Patient Name: [name] Date of Service: 08/26/2020 1:00 PM Medical Record Number: [id number] Patient Account Number: [id number] Date of Birth/Sex: January 17, 1930 (86 y.o. F) Treating RN: Huel Coventry Primary Care Greg Eckrich: Aram Beecham Other Clinician: Referring Tysin Salada: Aram Beecham Treating Semya Klinke/Extender: Altamese [place] in Treatment: 2 Clinic Level of Care Assessment Items TOOL 4 Quantity Score []  - Use when only an EandM is  performed on FOLLOW-UP visit 0 ASSESSMENTS - Nursing Assessment / Reassessment X - Reassessment of Co-morbidities (includes updates in patient status) 1 10 X- 1 5 Reassessment of Adherence to Treatment Plan ASSESSMENTS - Wound and Skin Assessment / Reassessment X - Simple Wound Assessment / Reassessment - one wound 1 5 []  - 0 Complex Wound Assessment / Reassessment - multiple wounds []  - 0 Dermatologic / Skin Assessment (not related to wound area) ASSESSMENTS - Focused Assessment []  - Circumferential Edema Measurements - multi extremities 0 []  - 0 Nutritional Assessment / Counseling / Intervention []  - 0 Lower Extremity Assessment (monofilament, tuning fork, pulses) []  - 0 Peripheral Arterial Disease Assessment (using hand held doppler) ASSESSMENTS - Ostomy and/or Continence Assessment and Care []  - Incontinence Assessment and Management 0 []  - 0 Ostomy Care Assessment and Management (repouching, etc.) PROCESS - Coordination of Care X - Simple Patient / Family Education for ongoing care 1 15 []  - 0 Complex (extensive) Patient / Family Education for ongoing care []  - 0 Staff obtains , Records, Test Results / Process Orders []  - 0 Staff telephones HHA, Nursing Homes / Clarify orders / etc []  - 0 Routine Transfer to another Facility (non-emergent condition) []  - 0 Routine Hospital Admission (non-emergent condition) []  - 0 New Admissions / / Ordering NPWT, Apligraf, etc. []  - 0 Emergency Hospital Admission (emergent condition) X- 1 10 Simple Discharge Coordination []  - 0 Complex (extensive) Discharge Coordination PROCESS - Special Needs []  - Pediatric / Minor Patient Management 0 []  - 0 Isolation Patient Management []  - 0 Hearing / Language / Visual special needs []  - 0 Assessment of Community assistance (transportation, D/C planning, etc.) []  - 0 Additional assistance / Altered mentation []  - 0 Support Surface(s) Assessment (bed,  cushion, seat, etc.) INTERVENTIONS - Wound Cleansing / Measurement LONITA, DEBES V. ( ) X- 1 5 Simple Wound Cleansing - one wound []  - 0 Complex Wound Cleansing -  multiple wounds X- 1 5 Wound Imaging (photographs - any number of wounds) []  - 0 Wound Tracing (instead of photographs) X- 1 5 Simple Wound Measurement - one wound []  - 0 Complex Wound Measurement - multiple wounds INTERVENTIONS - Wound Dressings []  - Small Wound Dressing one or multiple wounds 0 X- 1 15 Medium Wound Dressing one or multiple wounds []  - 0 Large Wound Dressing one or multiple wounds []  - 0 Application of Medications - topical []  - 0 Application of Medications - injection INTERVENTIONS - Miscellaneous []  - External ear exam 0 []  - 0 Specimen Collection (cultures, biopsies, blood, body fluids, etc.) []  - 0 Specimen(s) / Culture(s) sent or taken to Lab for analysis []  - 0 Patient Transfer (multiple staff / / Similar devices) []  - 0 Simple Staple / Suture removal (25 or less) []  - 0 Complex Staple / Suture removal (26 or more) []  - 0 Hypo / Hyperglycemic Management (close monitor of Blood Glucose) []  - 0 Ankle / Brachial Index (ABI) - do not check if billed separately X- 1 5 Vital Signs Has the patient been seen at the hospital within the last three years: Yes Total Score: 80 Level Of Care: New/Established - Level 3 Electronic Signature(s) Signed: 10/01/2020 6:08:34 PM By: , BSN, RN, CWS, Kim RN, BSN Entered By: , BSN, RN, CWS, Kim on 08/26/2020 13:37:30 ( ) -------------------------------------------------------------------------------- Encounter Discharge Information Details Patient Name: Date of Service: 08/26/2020 1:00 PM Medical Record Number: Patient Account Number: Nurse, adult Date of Birth/Sex: 1930/06/09 (86 y.o. F) Treating RN: Primary Care Oktober Glazer: Other  Clinician: Referring Rhiann Boucher: 10/03/2020 Treating Tashai Catino/Extender: Elliot Gurney in Treatment: 2 Encounter Discharge Information Items Discharge Condition: Stable Ambulatory Status: Wheelchair Discharge Destination: Home Transportation: Private Auto Accompanied By: caregiver Schedule Follow-up Appointment: Yes Clinical Summary of Care: Electronic Signature(s) Signed: 10/01/2020 6:08:34 PM By: 08/28/2020, BSN, RN, CWS, Kim RN, BSN Entered By: [name], BSN, RN, CWS, Kim on 08/26/2020 13:39:25 [name] (08/28/2020) -------------------------------------------------------------------------------- Lower Extremity Assessment Details Patient Name: [name], [name] Date of Service: 08/26/2020 1:00 PM Medical Record Number: 13/05/1930 Patient Account Number: [id number] Date of Birth/Sex: May 01, 1930 (86 y.o. F) Treating RN: Aram Beecham Primary Care Kunio Cummiskey: Altamese [place] Other Clinician: Referring Aris Moman: 10/03/2020 Treating Audra Kagel/Extender: Elliot Gurney in Treatment: 2 Electronic Signature(s) Signed: 08/26/2020 1:18:34 PM By: 08/28/2020 Signed: 10/01/2020 6:08:34 PM By: [id number] BSN, RN, CWS, Kim RN, BSN Entered By: [name] on 08/26/2020 13:06:46 [id number] ([id number]) -------------------------------------------------------------------------------- Multi Wound Chart Details Patient Name: 13/05/1930 Date of Service: 08/26/2020 1:00 PM Medical Record Number: Huel Coventry Patient Account Number: Aram Beecham Date of Birth/Sex: 01-29-1930 (86 y.o. F) Treating RN: 08/28/2020 Primary Care Rocco Kerkhoff: Benna Dunks Other Clinician: Referring Jirah Rider: 10/03/2020 Treating Tekeyah Santiago/Extender: Elliot Gurney in Treatment: 2 Vital Signs Height(in): 60 Pulse(bpm): 69 Weight(lbs): 125 Blood Pressure(mmHg): 142/55 Body Mass Index(BMI): 24 Temperature(F): 98.1 Respiratory Rate(breaths/min): 16 Photos: [N/A:N/A] Wound  Location: Left Forearm N/A N/A Wounding Event: Trauma N/A N/A Primary Etiology: Trauma, Other N/A N/A Comorbid History: Cataracts, Glaucoma, Optic N/A N/A Neuritis, Chronic sinus problems/congestion, Middle ear problems, Hypertension, Rheumatoid Arthritis Date Acquired: 08/10/2020 N/A N/A Weeks of Treatment: 2 N/A N/A Wound Status: Open N/A N/A Measurements L x W x D (cm) 1x3.5x0.1 N/A N/A Area (cm) : 2.749 N/A N/A Volume (cm) : 0.275 N/A N/A % Reduction in Area: 74.10% N/A N/A % Reduction in Volume:  74.10% N/A N/A Classification: Partial Thickness N/A N/A Exudate Amount: Medium N/A N/A Exudate Type: Serosanguineous N/A N/A Exudate Color: red, brown N/A N/A Wound Margin: Flat and Intact N/A N/A Granulation Amount: None Present (0%) N/A N/A Necrotic Amount: None Present (0%) N/A N/A Exposed Structures: Fat Layer (Subcutaneous Tissue): N/A N/A Yes Fascia: No Tendon: No Muscle: No Joint: No Bone: No Treatment Notes Wound #12 (Left Forearm) Notes Hydrofera blue, ABD, Conform, stretch net to secure. Electronic Signature(s) ANNELIZ, THORPE ([id number]) Signed: 08/27/2020 4:37:14 PM By: Baltazar Najjar MD Entered By: Baltazar Najjar on 08/26/2020 13:40:16 [name] ([id number]) -------------------------------------------------------------------------------- Multi-Disciplinary Care Plan Details Patient Name: CIERRIA, KOGAN Date of Service: 08/26/2020 1:00 PM Medical Record Number: [id number] Patient Account Number: [id number] Date of Birth/Sex: 10-Mar-1930 (86 y.o. F) Treating RN: Huel Coventry Primary Care Jayke Caul: Aram Beecham Other Clinician: Referring Jihaad Bruschi: Aram Beecham Treating Reign Bartnick/Extender: Altamese [place] in Treatment: 2 Active Inactive Abuse / Safety / Falls / Self Care Management Nursing Diagnoses: History of Falls Goals: Patient/caregiver will verbalize understanding of skin care regimen Date Initiated: 08/12/2020 Target Resolution  Date: 08/26/2020 Goal Status: Active Interventions: Assess fall risk on admission and as needed Notes: Orientation to the Wound Care Program Nursing Diagnoses: Knowledge deficit related to the wound healing center program Goals: Patient/caregiver will verbalize understanding of the Wound Healing Center Program Date Initiated: 08/12/2020 Target Resolution Date: 08/26/2020 Goal Status: Active Interventions: Provide education on orientation to the wound center Notes: Wound/Skin Impairment Nursing Diagnoses: Impaired tissue integrity Goals: Ulcer/skin breakdown will have a volume reduction of 30% by week 4 Date Initiated: 08/12/2020 Target Resolution Date: 09/09/2020 Goal Status: Active Interventions: Assess ulceration(s) every visit Treatment Activities: Referred to DME Giomar Gusler for dressing supplies : 08/12/2020 Notes: Electronic Signature(s) Signed: 10/01/2020 6:08:34 PM By: Elliot Gurney, BSN, RN, CWS, Kim RN, BSN Entered By: Elliot Gurney, BSN, RN, CWS, Kim on 08/26/2020 13:35:21 [name] ([id number]) -------------------------------------------------------------------------------- Pain Assessment Details Patient Name: [name] Date of Service: 08/26/2020 1:00 PM Medical Record Number: [id number] Patient Account Number: [id number] Date of Birth/Sex: Dec 02, 1930 (86 y.o. F) Treating RN: Huel Coventry Primary Care Zymeir Salminen: Aram Beecham Other Clinician: Referring Kenyah Luba: Aram Beecham Treating Rielly Brunn/Extender: Altamese [place] in Treatment: 2 Active Problems Location of Pain Severity and Description of Pain Patient Has Paino No Site Locations With Dressing Change: No Pain Management and Medication Current Pain Management: Electronic Signature(s) Signed: 08/26/2020 1:18:34 PM By: Benna Dunks Signed: 10/01/2020 6:08:34 PM By: Elliot Gurney, BSN, RN, CWS, Kim RN, BSN Entered By: Benna Dunks on 08/26/2020 13:04:45 [name]  ([id number]) -------------------------------------------------------------------------------- Patient/Caregiver Education Details Patient Name: [name] Date of Service: 08/26/2020 1:00 PM Medical Record Number: [id number] Patient Account Number: [id number] Date of Birth/Gender: 09-Oct-1930 (86 y.o. F) Treating RN: Huel Coventry Primary Care Physician: Aram Beecham Other Clinician: Referring Physician: Aram Beecham Treating Physician/Extender: Altamese [place] in Treatment: 2 Education Assessment Education Provided To: Patient Education Topics Provided Wound/Skin Impairment: Handouts: Caring for Your Ulcer Methods: Demonstration, Explain/Verbal Responses: State content correctly Electronic Signature(s) Signed: 10/01/2020 6:08:34 PM By: Elliot Gurney, BSN, RN, CWS, Kim RN, BSN Entered By: Elliot Gurney, BSN, RN, CWS, Kim on 08/26/2020 13:37:56 [name] ([id number]) -------------------------------------------------------------------------------- Wound Assessment Details Patient Name: [name] Date of Service: 08/26/2020 1:00 PM Medical Record Number: [id number] Patient Account Number: [id number] Date of Birth/Sex: Oct 03, 1930 (86 y.o. F) Treating RN: Huel Coventry Primary Care Florencio Hollibaugh: Aram Beecham Other Clinician: Referring Kale Dols: Aram Beecham Treating Annaka Cleaver/Extender: Altamese [place] in Treatment:  2 Wound Status Wound Number: 12 Primary Trauma, Other Etiology: Wound Location: Left Forearm Wound Open Wounding Event: Trauma Status: Date Acquired: 08/10/2020 Comorbid Cataracts, Glaucoma, Optic Neuritis, Chronic sinus Weeks Of Treatment: 2 History: problems/congestion, Middle ear problems, Hypertension, Clustered Wound: No Rheumatoid Arthritis Photos Wound Measurements Length: (cm) 1 Width: (cm) 3.5 Depth: (cm) 0.1 Area: (cm) 2.749 Volume: (cm) 0.275 % Reduction in Area: 74.1% % Reduction in Volume: 74.1% Wound  Description Classification: Partial Thickness Wound Margin: Flat and Intact Exudate Amount: Medium Exudate Type: Serosanguineous Exudate Color: red, brown Foul Odor After Cleansing: No Slough/Fibrino No Wound Bed Granulation Amount: None Present (0%) Exposed Structure Necrotic Amount: None Present (0%) Fascia Exposed: No Fat Layer (Subcutaneous Tissue) Exposed: Yes Tendon Exposed: No Muscle Exposed: No Joint Exposed: No Bone Exposed: No Electronic Signature(s) Signed: 08/26/2020 1:18:34 PM By: Benna Dunks Signed: 10/01/2020 6:08:34 PM By: Elliot Gurney, BSN, RN, CWS, Kim RN, BSN Entered By: Benna Dunks on 08/26/2020 13:06:29 [name] ([id number]) -------------------------------------------------------------------------------- Vitals Details Patient Name: [name] Date of Service: 08/26/2020 1:00 PM Medical Record Number: [id number] Patient Account Number: [id number] Date of Birth/Sex: 04/05/1930 (86 y.o. F) Treating RN: Huel Coventry Primary Care Sumedh Shinsato: Aram Beecham Other Clinician: Referring Abrial Arrighi: Aram Beecham Treating Jaelen Gellerman/Extender: Altamese [place] in Treatment: 2 Vital Signs Time Taken: 12:55 Temperature (F): 98.1 Height (in): 60 Pulse (bpm): 69 Weight (lbs): 125 Respiratory Rate (breaths/min): 16 Body Mass Index (BMI): 24.4 Blood Pressure (mmHg): 142/55 Reference Range: 80 - 120 mg / dl Electronic Signature(s) Signed: 08/26/2020 4:20:26 PM By: Dayton Martes RCP, RRT, CHT Entered By: Dayton Martes on 08/26/2020 13:00:30

## 2021-01-11 ENCOUNTER — Other Ambulatory Visit: Payer: Self-pay | Admitting: Internal Medicine

## 2021-01-11 DIAGNOSIS — Z1231 Encounter for screening mammogram for malignant neoplasm of breast: Secondary | ICD-10-CM

## 2021-02-04 ENCOUNTER — Other Ambulatory Visit: Payer: Self-pay | Admitting: Internal Medicine

## 2021-02-04 DIAGNOSIS — M7989 Other specified soft tissue disorders: Secondary | ICD-10-CM

## 2021-02-04 DIAGNOSIS — M79605 Pain in left leg: Secondary | ICD-10-CM

## 2021-02-08 ENCOUNTER — Other Ambulatory Visit: Payer: Self-pay

## 2021-02-08 ENCOUNTER — Ambulatory Visit
Admission: RE | Admit: 2021-02-08 | Discharge: 2021-02-08 | Disposition: A | Discharge status: Home / Self Care | Payer: Medicare Other | Source: Ambulatory Visit | Attending: Internal Medicine | Admitting: Internal Medicine

## 2021-02-08 DIAGNOSIS — M7989 Other specified soft tissue disorders: Secondary | ICD-10-CM | POA: Insufficient documentation

## 2021-02-08 DIAGNOSIS — M79605 Pain in left leg: Secondary | ICD-10-CM | POA: Diagnosis not present

## 2021-02-10 ENCOUNTER — Ambulatory Visit: Payer: Medicare Other

## 2021-02-12 ENCOUNTER — Ambulatory Visit: Payer: Medicare Other

## 2021-07-16 ENCOUNTER — Emergency Department
Admission: EM | Admit: 2021-07-16 | Discharge: 2021-07-16 | Disposition: A | Discharge status: Home / Self Care | Payer: Medicare Other | Attending: Emergency Medicine | Admitting: Emergency Medicine

## 2021-07-16 ENCOUNTER — Other Ambulatory Visit: Payer: Self-pay

## 2021-07-16 ENCOUNTER — Encounter: Payer: Self-pay | Admitting: Intensive Care

## 2021-07-16 ENCOUNTER — Emergency Department: Payer: Medicare Other

## 2021-07-16 DIAGNOSIS — Z950 Presence of cardiac pacemaker: Secondary | ICD-10-CM | POA: Diagnosis not present

## 2021-07-16 DIAGNOSIS — R531 Weakness: Secondary | ICD-10-CM | POA: Insufficient documentation

## 2021-07-16 DIAGNOSIS — Z96652 Presence of left artificial knee joint: Secondary | ICD-10-CM | POA: Insufficient documentation

## 2021-07-16 DIAGNOSIS — J449 Chronic obstructive pulmonary disease, unspecified: Secondary | ICD-10-CM | POA: Diagnosis not present

## 2021-07-16 DIAGNOSIS — I10 Essential (primary) hypertension: Secondary | ICD-10-CM | POA: Diagnosis not present

## 2021-07-16 DIAGNOSIS — Z79899 Other long term (current) drug therapy: Secondary | ICD-10-CM | POA: Diagnosis not present

## 2021-07-16 LAB — URINALYSIS, COMPLETE (UACMP) WITH MICROSCOPIC
Bacteria, UA: NONE SEEN
Bilirubin Urine: NEGATIVE
Glucose, UA: NEGATIVE mg/dL
Ketones, ur: NEGATIVE mg/dL
Nitrite: NEGATIVE
Protein, ur: NEGATIVE mg/dL
Specific Gravity, Urine: 1.008 (ref 1.005–1.030)
pH: 7 (ref 5.0–8.0)

## 2021-07-16 LAB — BASIC METABOLIC PANEL
Anion gap: 7 (ref 5–15)
BUN: 14 mg/dL (ref 8–23)
CO2: 28 mmol/L (ref 22–32)
Calcium: 9.6 mg/dL (ref 8.9–10.3)
Chloride: 97 mmol/L — ABNORMAL LOW (ref 98–111)
Creatinine, Ser: 0.7 mg/dL (ref 0.44–1.00)
GFR, Estimated: >60 mL/min (ref >60)
Glucose, Bld: 102 mg/dL — ABNORMAL HIGH (ref 70–99)
Potassium: 4.6 mmol/L (ref 3.5–5.1)
Sodium: 132 mmol/L — ABNORMAL LOW (ref 135–145)

## 2021-07-16 LAB — CBC
HCT: 36.3 % (ref 36.0–46.0)
Hemoglobin: 12.9 g/dL (ref 12.0–15.0)
MCH: 37.4 pg — ABNORMAL HIGH (ref 26.0–34.0)
MCHC: 35.5 g/dL (ref 30.0–36.0)
MCV: 105.2 fL — ABNORMAL HIGH (ref 80.0–100.0)
Platelets: 233 10*3/uL (ref 150–400)
RBC: 3.45 MIL/uL — ABNORMAL LOW (ref 3.87–5.11)
RDW: 12 % (ref 11.5–15.5)
WBC: 6.9 10*3/uL (ref 4.0–10.5)
nRBC: 0 % (ref 0.0–0.2)

## 2021-07-16 NOTE — ED Triage Notes (Signed)
Patient presents with weakness X1 month.

## 2021-07-16 NOTE — ED Triage Notes (Signed)
First Nurse Note:  Arrives from [facility] for ED evaluation.  AAOx3.  Skin warm and dry. NAD

## 2021-07-16 NOTE — ED Provider Notes (Signed)
[facility] ([facility]) Emergency Department Provider Note   ____________________________________________    I have reviewed the triage vital signs and the nursing notes.   HISTORY  Chief Complaint Weakness     HPI [name] is a 87 y.o. female with a history of neuropathy, lymphedema, COPD and additional past medical history as detailed below.  She presents with chronic weakness.  Patient reports she ambulates with a walker, up until a month ago she was able to get around on her own however more recently she is having increased weakness in her left leg which is always caused her problems but seems to be worse.  Denies headache, denies upper extremity weakness.  No abdominal pain.  No fevers.  Past Medical History:  Diagnosis Date   Anemia    GERD (gastroesophageal reflux disease)    Hypertension    Neuropathy    Peripheral vascular disease (HCC)    possible neuropathies in lower extremeties   PONV (postoperative nausea and vomiting)    happens sometimes but better with pre med of zofran   Presence of permanent cardiac pacemaker    Syncope     Patient Active Problem List   Diagnosis Date Noted   Septic olecranon bursitis of left elbow 03/27/2018   Lymphedema 10/11/2017   Chronic venous insufficiency 10/11/2017   Leg pain 10/11/2017   Leg swelling 10/11/2017   COPD (chronic obstructive pulmonary disease) (HCC) 10/11/2017   Essential hypertension 10/11/2017    Past Surgical History:  Procedure Laterality Date   ABDOMINAL HYSTERECTOMY  1969   APPENDECTOMY  1969   with hysterectomy   BACK SURGERY  2001   rods in back. surgery on back x 3   COLONOSCOPY     I & D EXTREMITY Left 03/27/2018   Procedure: IRRIGATION AND DEBRIDEMENT LEFT ELBOW / OLECRANON BURSA;  Surgeon: Christena Flake, MD;  Location: [facility] ORS;  Service: Orthopedics;  Laterality: Left;   I & D EXTREMITY Left 06/05/2018   Procedure: IRRIGATION AND DEBRIDEMENT EXTREMITY;  Surgeon: Christena Flake, MD;  Location: [facility] ORS;  Service: Orthopedics;  Laterality: Left;   INSERT / REPLACE / REMOVE PACEMAKER  2004   JOINT REPLACEMENT Left 2004   partial knee replacement   OLECRANON BURSECTOMY Left 03/27/2018   Procedure: LEFT OLECRANON BURSA;  Surgeon: Christena Flake, MD;  Location: [facility] ORS;  Service: Orthopedics;  Laterality: Left;   PACEMAKER INSERTION  2014   dual chamber for complete heart block    Prior to Admission medications   Medication Sig Start Date End Date Taking? Authorizing Provider  acetaminophen (TYLENOL) 650 MG CR tablet Take 1,300 mg by mouth 2 (two) times daily.    [provider]  Ascorbic Acid (VITAMIN C) 1000 MG tablet Take 1,000 mg by mouth daily.    [provider]  Azelaic Acid (FINACEA) 15 % cream Apply 1 application topically every other day. (alternates between Retin-A)    [provider]  Calcium Carbonate-Vitamin D (CALCIUM-D) 600-400 MG-UNIT TABS Take 1 tablet by mouth 2 (two) times daily.    [provider]  Calcium Polycarbophil (FIBER-CAPS PO) Take 1 capsule by mouth at bedtime.     [provider]  Cholecalciferol (VITAMIN D-3) 5000 units TABS Take 5,000 Units by mouth daily.    [provider]  cyanocobalamin (,VITAMIN B-12,) 1000 MCG/ML injection Inject 1,000 mcg into the muscle every 30 (thirty) days.    [provider]  diclofenac sodium (VOLTAREN)  1 % GEL Apply 2-4 g topically 4 (four) times daily as needed (for back pain.).    [provider]  estradiol (ESTRACE) 0.1 MG/GM vaginal cream Place 1 Applicatorful vaginally at bedtime as needed (for vaginal discomfort).    [provider]  folic acid (FOLVITE) 1 MG tablet Take 2 mg by mouth daily.    [provider]  hydroxychloroquine (PLAQUENIL) 200 MG tablet Take 200 mg by mouth daily.    [provider]  methotrexate 50 MG/2ML injection Inject 25 mg into the muscle every Saturday. On hold until after  surgery 08/21/17   [provider]  Misc Natural Products (CALMME PO) Take 5 mLs by mouth at bedtime. Magnesium 1 tsp at night (dissolved in 4 oz. Water)    [provider]  Multiple Vitamin (MULTIVITAMIN) tablet Take 1 tablet by mouth daily.    [provider]  nebivolol (BYSTOLIC) 5 MG tablet Take 5 mg by mouth daily.    [provider]  Omega-3 Fatty Acids (FISH OIL) 1000 MG CAPS Take 1,000 mg by mouth 2 (two) times daily.    [provider]  omeprazole (PRILOSEC) 40 MG capsule Take 40 mg by mouth daily before breakfast.     [provider]  ondansetron (ZOFRAN) 4 MG tablet Take 4 mg by mouth every 8 (eight) hours as needed for nausea or vomiting.    [provider]  Probiotic Product (PHILLIPS COLON HEALTH PO) Take 1 tablet by mouth daily.    [provider]  traMADol (ULTRAM) 50 MG tablet Take 1 tablet (50 mg total) by mouth every 6 (six) hours as needed for moderate pain. 06/05/18   Poggi, Excell Seltzer, MD  tretinoin (RETIN-A) 0.05 % cream Apply 1 application topically every other day. (alternate between finacea)    [provider]  vitamin B-12 (CYANOCOBALAMIN) 1000 MCG tablet Take 1,000 mcg by mouth daily.    [provider]     Allergies Codeine, Doxycycline, Morphine, Tramadol, Adhesive [tape], Amitriptyline, Ciprofloxacin, Clarithromycin, Erythromycin, Itraconazole, Nsaids, Penicillins, and Zoloft [sertraline hcl]  Family History  Problem Relation Age of Onset   Breast cancer Mother 27    Social History Social History   Tobacco Use   Smoking status: Never   Smokeless tobacco: Never  Vaping Use   Vaping Use: Never used  Substance Use Topics   Alcohol use: Yes    Alcohol/week: 7.0 standard drinks    Types: 7 Glasses of wine per week    Comment: 1 glass of wine   Drug use: Never    Review of Systems  Constitutional: No fever/chills Eyes: No visual changes.  ENT: No sore  throat. Cardiovascular: Denies chest pain. Respiratory: Denies shortness of breath. Gastrointestinal: No abdominal pain.  No nausea, no vomiting.   Genitourinary: Negative for dysuria. Musculoskeletal: As above Skin: Negative for rash. Neurological: As above   ____________________________________________   PHYSICAL EXAM:  VITAL SIGNS: ED Triage Vitals  Enc Vitals Group     BP 07/16/21 1019 (!) 169/81     Pulse Rate 07/16/21 1019 70     Resp 07/16/21 1019 20     Temp 07/16/21 1019 98.5 F (36.9 C)     Temp Source 07/16/21 1019 Oral     SpO2 07/16/21 1019 99 %     Weight 07/16/21 1019 56.7 kg (125 lb)     Height 07/16/21 1019 1.524 m (5')     Head Circumference --      Peak  Flow --      Pain Score 07/16/21 1023 0     Pain Loc --      Pain Edu? --      Excl. in GC? --     Constitutional: Alert and oriented.  Eyes: Conjunctivae are normal.  Head: Atraumatic. Nose: No congestion/rhinnorhea. Mouth/Throat: Mucous membranes are moist.   Neck:  Painless ROM Cardiovascular: Normal rate, regular rhythm. Grossly normal heart sounds.  Good peripheral circulation. Respiratory: Normal respiratory effort.  No retractions. Lungs CTAB. Gastrointestinal: Soft and nontender. No distention.  No CVA tenderness.  Musculoskeletal: Warm and well perfused, normal cap refill Neurologic:  Normal speech and language.  Chronic weakness to the left lower leg Skin:  Skin is warm, dry and intact. No rash noted. Psychiatric: Mood and affect are normal. Speech and behavior are normal.  ____________________________________________   LABS (all labs ordered are listed, but only abnormal results are displayed)  Labs Reviewed  BASIC METABOLIC PANEL - Abnormal; Notable for the following components:      Result Value   Sodium 132 (*)    Chloride 97 (*)    Glucose, Bld 102 (*)    All other components within normal limits  CBC - Abnormal; Notable for the following components:   RBC 3.45 (*)     MCV 105.2 (*)    MCH 37.4 (*)    All other components within normal limits  URINALYSIS, COMPLETE (UACMP) WITH MICROSCOPIC - Abnormal; Notable for the following components:   Color, Urine STRAW (*)    APPearance CLEAR (*)    Hgb urine dipstick SMALL (*)    Leukocytes,Ua TRACE (*)    All other components within normal limits   ____________________________________________  EKG  ED ECG REPORT I, Jene Every, the attending physician, personally viewed and interpreted this ECG.  Date: 07/16/2021  Rhythm: Paced rhythm QRS Axis: normal Intervals: Abnormal ST/T Wave abnormalities: normal Narrative Interpretation: no evidence of acute ischemia  ____________________________________________  RADIOLOGY  CT head reviewed by me, no acute abnormality ____________________________________________   PROCEDURES  Procedure(s) performed: No  Procedures   Critical Care performed: No ____________________________________________   INITIAL IMPRESSION / ASSESSMENT AND PLAN / ED COURSE  Pertinent labs & imaging results that were available during my care of the patient were reviewed by me and considered in my medical decision making (see chart for details).   Patient presents with chronic left lower extremity weakness, worsening over the last month.  Here for evaluation although recognizes that unlikely there is any reversible cause, has outpatient follow-up with her PCP next week  Will obtain labs, urine, CT head to evaluate for reversible cause  Nothing noted on work-up, appropriate for discharge with outpatient follow-up    ____________________________________________   FINAL CLINICAL IMPRESSION(S) / ED DIAGNOSES  Final diagnoses:  Generalized weakness        Note:  This document was prepared using Dragon voice recognition software and may include unintentional dictation errors.    Jene Every, MD 07/16/21 1357

## 2021-07-16 NOTE — ED Notes (Signed)
Pt ambulated with walker to bedside toilet at this time.

## 2021-07-23 ENCOUNTER — Other Ambulatory Visit: Payer: Self-pay

## 2021-07-23 ENCOUNTER — Encounter: Payer: Self-pay | Admitting: Internal Medicine

## 2021-07-23 ENCOUNTER — Emergency Department: Payer: Medicare Other

## 2021-07-23 ENCOUNTER — Inpatient Hospital Stay
Admission: EM | Admit: 2021-07-23 | Discharge: 2021-07-27 | DRG: 689 | Disposition: A | Discharge status: Skilled Nursing Facility | Payer: Medicare Other | Attending: Internal Medicine | Admitting: Internal Medicine

## 2021-07-23 DIAGNOSIS — I10 Essential (primary) hypertension: Secondary | ICD-10-CM | POA: Diagnosis not present

## 2021-07-23 DIAGNOSIS — G9341 Metabolic encephalopathy: Secondary | ICD-10-CM | POA: Diagnosis not present

## 2021-07-23 DIAGNOSIS — D539 Nutritional anemia, unspecified: Secondary | ICD-10-CM | POA: Diagnosis present

## 2021-07-23 DIAGNOSIS — N3000 Acute cystitis without hematuria: Principal | ICD-10-CM

## 2021-07-23 DIAGNOSIS — M069 Rheumatoid arthritis, unspecified: Secondary | ICD-10-CM | POA: Diagnosis present

## 2021-07-23 DIAGNOSIS — I739 Peripheral vascular disease, unspecified: Secondary | ICD-10-CM | POA: Diagnosis present

## 2021-07-23 DIAGNOSIS — N390 Urinary tract infection, site not specified: Secondary | ICD-10-CM

## 2021-07-23 DIAGNOSIS — R531 Weakness: Secondary | ICD-10-CM

## 2021-07-23 DIAGNOSIS — Z79899 Other long term (current) drug therapy: Secondary | ICD-10-CM

## 2021-07-23 DIAGNOSIS — Z950 Presence of cardiac pacemaker: Secondary | ICD-10-CM

## 2021-07-23 DIAGNOSIS — K219 Gastro-esophageal reflux disease without esophagitis: Secondary | ICD-10-CM | POA: Diagnosis present

## 2021-07-23 DIAGNOSIS — J449 Chronic obstructive pulmonary disease, unspecified: Secondary | ICD-10-CM | POA: Diagnosis present

## 2021-07-23 DIAGNOSIS — G629 Polyneuropathy, unspecified: Secondary | ICD-10-CM | POA: Diagnosis present

## 2021-07-23 DIAGNOSIS — Z20822 Contact with and (suspected) exposure to covid-19: Secondary | ICD-10-CM | POA: Diagnosis present

## 2021-07-23 DIAGNOSIS — I872 Venous insufficiency (chronic) (peripheral): Secondary | ICD-10-CM | POA: Diagnosis present

## 2021-07-23 DIAGNOSIS — G928 Other toxic encephalopathy: Secondary | ICD-10-CM | POA: Diagnosis present

## 2021-07-23 DIAGNOSIS — E871 Hypo-osmolality and hyponatremia: Secondary | ICD-10-CM | POA: Diagnosis not present

## 2021-07-23 DIAGNOSIS — T428X5A Adverse effect of antiparkinsonism drugs and other central muscle-tone depressants, initial encounter: Secondary | ICD-10-CM | POA: Diagnosis present

## 2021-07-23 DIAGNOSIS — Z90710 Acquired absence of both cervix and uterus: Secondary | ICD-10-CM

## 2021-07-23 DIAGNOSIS — R4182 Altered mental status, unspecified: Secondary | ICD-10-CM

## 2021-07-23 DIAGNOSIS — Z803 Family history of malignant neoplasm of breast: Secondary | ICD-10-CM

## 2021-07-23 LAB — COMPREHENSIVE METABOLIC PANEL
ALT: 20 U/L (ref 0–44)
AST: 34 U/L (ref 15–41)
Albumin: 4.1 g/dL (ref 3.5–5.0)
Alkaline Phosphatase: 63 U/L (ref 38–126)
Anion gap: 10 (ref 5–15)
BUN: 19 mg/dL (ref 8–23)
CO2: 23 mmol/L (ref 22–32)
Calcium: 9.3 mg/dL (ref 8.9–10.3)
Chloride: 97 mmol/L — ABNORMAL LOW (ref 98–111)
Creatinine, Ser: 0.8 mg/dL (ref 0.44–1.00)
GFR, Estimated: >60 mL/min (ref >60)
Glucose, Bld: 145 mg/dL — ABNORMAL HIGH (ref 70–99)
Potassium: 4.4 mmol/L (ref 3.5–5.1)
Sodium: 130 mmol/L — ABNORMAL LOW (ref 135–145)
Total Bilirubin: 1.1 mg/dL (ref 0.3–1.2)
Total Protein: 7.1 g/dL (ref 6.5–8.1)

## 2021-07-23 LAB — CBC WITH DIFFERENTIAL/PLATELET
Abs Immature Granulocytes: 0.03 K/uL (ref 0.00–0.07)
Basophils Absolute: 0 K/uL (ref 0.0–0.1)
Basophils Relative: 0 %
Eosinophils Absolute: 0 K/uL (ref 0.0–0.5)
Eosinophils Relative: 0 %
HCT: 34.2 % — ABNORMAL LOW (ref 36.0–46.0)
Hemoglobin: 12.1 g/dL (ref 12.0–15.0)
Immature Granulocytes: 0 %
Lymphocytes Relative: 10 %
Lymphs Abs: 0.9 K/uL (ref 0.7–4.0)
MCH: 36.7 pg — ABNORMAL HIGH (ref 26.0–34.0)
MCHC: 35.4 g/dL (ref 30.0–36.0)
MCV: 103.6 fL — ABNORMAL HIGH (ref 80.0–100.0)
Monocytes Absolute: 0.7 K/uL (ref 0.1–1.0)
Monocytes Relative: 8 %
Neutro Abs: 6.9 K/uL (ref 1.7–7.7)
Neutrophils Relative %: 82 %
Platelets: 250 K/uL (ref 150–400)
RBC: 3.3 MIL/uL — ABNORMAL LOW (ref 3.87–5.11)
RDW: 11.2 % — ABNORMAL LOW (ref 11.5–15.5)
WBC: 8.6 K/uL (ref 4.0–10.5)
nRBC: 0 % (ref 0.0–0.2)

## 2021-07-23 LAB — LACTIC ACID, PLASMA: Lactic Acid, Venous: 1.7 mmol/L (ref 0.5–1.9)

## 2021-07-23 LAB — URINALYSIS, COMPLETE (UACMP) WITH MICROSCOPIC
Bilirubin Urine: NEGATIVE
Glucose, UA: NEGATIVE mg/dL
Ketones, ur: 5 mg/dL — AB
Nitrite: POSITIVE — AB
Protein, ur: 30 mg/dL — AB
Specific Gravity, Urine: 1.012 (ref 1.005–1.030)
Squamous Epithelial / HPF: NONE SEEN (ref 0–5)
pH: 7 (ref 5.0–8.0)

## 2021-07-23 LAB — TROPONIN I (HIGH SENSITIVITY)
Troponin I (High Sensitivity): 17 ng/L (ref <18)
Troponin I (High Sensitivity): 20 ng/L — ABNORMAL HIGH (ref <18)

## 2021-07-23 LAB — PROCALCITONIN: Procalcitonin: <0.1 ng/mL

## 2021-07-23 LAB — TSH: TSH: 4.113 u[IU]/mL (ref 0.350–4.500)

## 2021-07-23 MED ORDER — ENOXAPARIN SODIUM 40 MG/0.4ML IJ SOSY
40.0000 mg | PREFILLED_SYRINGE | INTRAMUSCULAR | Status: DC
  Administered 2021-07-23 – 2021-07-26 (×4): 40 mg via SUBCUTANEOUS
  Filled 2021-07-23 (×4): qty 0.4

## 2021-07-23 MED ORDER — ACETAMINOPHEN 325 MG PO TABS
650.0000 mg | ORAL_TABLET | Freq: Four times a day (QID) | ORAL | Status: DC | PRN
  Administered 2021-07-24 – 2021-07-26 (×2): 650 mg via ORAL
  Filled 2021-07-23 (×2): qty 2

## 2021-07-23 MED ORDER — SODIUM CHLORIDE 0.9 % IV BOLUS
500.0000 mL | Freq: Once | INTRAVENOUS | Status: AC
  Administered 2021-07-23: 500 mL via INTRAVENOUS

## 2021-07-23 MED ORDER — SODIUM CHLORIDE 0.9 % IV SOLN
1.0000 g | INTRAVENOUS | Status: AC
  Administered 2021-07-24 – 2021-07-26 (×3): 1 g via INTRAVENOUS
  Filled 2021-07-23: qty 1
  Filled 2021-07-23: qty 10
  Filled 2021-07-23: qty 1
  Filled 2021-07-23: qty 10

## 2021-07-23 MED ORDER — NEBIVOLOL HCL 5 MG PO TABS: 5.0000 mg | ORAL_TABLET | Freq: Every day | ORAL | Status: DC

## 2021-07-23 MED ORDER — PANTOPRAZOLE SODIUM 40 MG PO TBEC
40.0000 mg | DELAYED_RELEASE_TABLET | Freq: Every day | ORAL | Status: DC
Start: 2021-07-23 — End: 2021-07-27
  Administered 2021-07-23 – 2021-07-27 (×5): 40 mg via ORAL
  Filled 2021-07-23 (×5): qty 1

## 2021-07-23 MED ORDER — SODIUM CHLORIDE 0.9 % IV SOLN
1.0000 g | Freq: Once | INTRAVENOUS | Status: AC
  Administered 2021-07-23: 1 g via INTRAVENOUS
  Filled 2021-07-23: qty 10

## 2021-07-23 MED ORDER — ACETAMINOPHEN 650 MG RE SUPP: 650.0000 mg | Freq: Four times a day (QID) | RECTAL | Status: DC | PRN

## 2021-07-23 MED ORDER — PROPRANOLOL HCL ER 60 MG PO CP24
60.0000 mg | ORAL_CAPSULE | Freq: Every day | ORAL | Status: DC
  Administered 2021-07-23 – 2021-07-27 (×5): 60 mg via ORAL
  Filled 2021-07-23 (×6): qty 1

## 2021-07-23 MED ORDER — ONDANSETRON HCL 4 MG PO TABS: 4.0000 mg | ORAL_TABLET | Freq: Four times a day (QID) | ORAL | Status: DC | PRN

## 2021-07-23 MED ORDER — ONDANSETRON HCL 4 MG/2ML IJ SOLN
4.0000 mg | Freq: Four times a day (QID) | INTRAMUSCULAR | Status: DC | PRN
  Administered 2021-07-23 – 2021-07-24 (×2): 4 mg via INTRAVENOUS
  Filled 2021-07-23 (×2): qty 2

## 2021-07-23 MED ORDER — SODIUM CHLORIDE 0.9 % IV SOLN: INTRAVENOUS | Status: DC

## 2021-07-23 NOTE — Subjective & Objective (Signed)
CC: confusion, weakness HPI: 87 year old female with a history of hypertension, chronic venous insufficiency, reflux who presents to the ER today with a 3-day history of weakness, 1 day history of altered mental status.  Patient was seen last week in the ER due to altered mental status.  CT scan performed at that time was negative for stroke.  Patient was followed up with her primary care physician.  She had been complaining of some back spasms.  P.o. baclofen was ordered.  Patient's son(Zettie Gootee) states that when the patient was started on baclofen, he immediately noticed patient's mentation getting worse.  Patient only took 2 days of baclofen.  Last dose was yesterday.  Patient lives at home alone.  She has home health.  Prior to her altered mental status, the patient was still able to call her son and carry on activities of daily living with assistance.  Son states the patient's mentation was quite good.  Patient does have decreased hearing which she uses hearing aids.  Her last 48 hours, son has noticed increasing confusion.  No fevers.  Son states he saw his father with UTI and confusion.  Patient brought to the ER for evaluation.  In ER, CT head was negative for acute stroke.  Patient has permanent pacemaker and cannot have an MRI.  UA was positive for nitrites.  Serum sodium was low at 130.  Due to the patient's UTI, acute metabolic encephalopathy and hyponatremia, triad hospitalist consulted for admission.

## 2021-07-23 NOTE — ED Triage Notes (Signed)
Complains of neuropathy in bilateral hands. Pressed life alert button because of weakness in left leg. Came in by EMS who stated that house had strong urine odor.

## 2021-07-23 NOTE — ED Notes (Signed)
Son at bedside. Advised that she is confused and this is abnormal for her. No history of dementia. This started yesterday. She started a new medication a few days ago.

## 2021-07-23 NOTE — Assessment & Plan Note (Signed)
Stable

## 2021-07-23 NOTE — ED Notes (Signed)
RN to bedside to introduce self to pt. Pt sleeping.  

## 2021-07-23 NOTE — Assessment & Plan Note (Signed)
Multifactorial nature.  Could be due to recent baclofen administration versus UTI.  We will stop baclofen.  Continue IV antibiotics.  We will see if her mentation clears up after treatment.  Patient is not taking any other mind altering medications.  Son states the patient is no longer taking Ultram.

## 2021-07-23 NOTE — ED Provider Notes (Signed)
[facility] Emergency Department Provider Note   ____________________________________________   Event Date/Time   First MD Initiated Contact with Patient 07/23/21 [phone]     (approximate)  I have reviewed the triage vital signs and the nursing notes.   HISTORY  Chief Complaint Altered Mental Status  Level V caveat: Limited by confusion  HPI [name] is a 87 y.o. female brought to the ED via EMS from home with a chief complaint of altered mentation.  Patient with a history of hypertension, COPD, neuropathy who was seen in the ED 07/16/2021 and by her PCP 07/21/2021 for chronic left lower leg weakness now progressing and associated with ataxia.  She was referred by her PCP to neurology.  Patient lives alone and hit her life alert button for left lower leg weakness and gait instability while trying to ambulate to the restroom.  She was found by EMS to be confused.  Denies fall/injury/trauma.  Denies fever, cough, chest pain, shortness of breath, abdominal pain, nausea, vomiting or dizziness.     Past Medical History:  Diagnosis Date   Anemia    GERD (gastroesophageal reflux disease)    Hypertension    Neuropathy    Peripheral vascular disease (HCC)    possible neuropathies in lower extremeties   PONV (postoperative nausea and vomiting)    happens sometimes but better with pre med of zofran   Presence of permanent cardiac pacemaker    Syncope     Patient Active Problem List   Diagnosis Date Noted   Septic olecranon bursitis of left elbow 03/27/2018   Lymphedema 10/11/2017   Chronic venous insufficiency 10/11/2017   Leg pain 10/11/2017   Leg swelling 10/11/2017   COPD (chronic obstructive pulmonary disease) (HCC) 10/11/2017   Essential hypertension 10/11/2017    Past Surgical History:  Procedure Laterality Date   ABDOMINAL HYSTERECTOMY  1969   APPENDECTOMY  1969   with hysterectomy   BACK SURGERY  2001   rods in back. surgery on back x 3    COLONOSCOPY     I & D EXTREMITY Left 03/27/2018   Procedure: IRRIGATION AND DEBRIDEMENT LEFT ELBOW / OLECRANON BURSA;  Surgeon: Christena Flake, MD;  Location: [facility] ORS;  Service: Orthopedics;  Laterality: Left;   I & D EXTREMITY Left 06/05/2018   Procedure: IRRIGATION AND DEBRIDEMENT EXTREMITY;  Surgeon: Christena Flake, MD;  Location: [facility] ORS;  Service: Orthopedics;  Laterality: Left;   INSERT / REPLACE / REMOVE PACEMAKER  2004   JOINT REPLACEMENT Left 2004   partial knee replacement   OLECRANON BURSECTOMY Left 03/27/2018   Procedure: LEFT OLECRANON BURSA;  Surgeon: Christena Flake, MD;  Location: [facility] ORS;  Service: Orthopedics;  Laterality: Left;   PACEMAKER INSERTION  2014   dual chamber for complete heart block    Prior to Admission medications   Medication Sig Start Date End Date Taking? Authorizing Provider  acetaminophen (TYLENOL) 650 MG CR tablet Take 1,300 mg by mouth 2 (two) times daily.    [provider]  Ascorbic Acid (VITAMIN C) 1000 MG tablet Take 1,000 mg by mouth daily.    [provider]  Azelaic Acid (FINACEA) 15 % cream Apply 1 application topically every other day. (alternates between Retin-A)    [provider]  Calcium Carbonate-Vitamin D (CALCIUM-D) 600-400 MG-UNIT TABS Take 1 tablet by mouth 2 (two) times daily.    [provider]  Calcium Polycarbophil (FIBER-CAPS PO) Take 1 capsule by mouth at  bedtime.     [provider]  Cholecalciferol (VITAMIN D-3) 5000 units TABS Take 5,000 Units by mouth daily.    [provider]  cyanocobalamin (,VITAMIN B-12,) 1000 MCG/ML injection Inject 1,000 mcg into the muscle every 30 (thirty) days.    [provider]  diclofenac sodium (VOLTAREN) 1 % GEL Apply 2-4 g topically 4 (four) times daily as needed (for back pain.).    [provider]  estradiol (ESTRACE) 0.1 MG/GM vaginal cream Place 1 Applicatorful vaginally at bedtime as needed (for vaginal discomfort).     [provider]  folic acid (FOLVITE) 1 MG tablet Take 2 mg by mouth daily.    [provider]  hydroxychloroquine (PLAQUENIL) 200 MG tablet Take 200 mg by mouth daily.    [provider]  methotrexate 50 MG/2ML injection Inject 25 mg into the muscle every Saturday. On hold until after surgery 08/21/17   [provider]  Misc Natural Products (CALMME PO) Take 5 mLs by mouth at bedtime. Magnesium 1 tsp at night (dissolved in 4 oz. Water)    [provider]  Multiple Vitamin (MULTIVITAMIN) tablet Take 1 tablet by mouth daily.    [provider]  nebivolol (BYSTOLIC) 5 MG tablet Take 5 mg by mouth daily.    [provider]  Omega-3 Fatty Acids (FISH OIL) 1000 MG CAPS Take 1,000 mg by mouth 2 (two) times daily.    [provider]  omeprazole (PRILOSEC) 40 MG capsule Take 40 mg by mouth daily before breakfast.     [provider]  ondansetron (ZOFRAN) 4 MG tablet Take 4 mg by mouth every 8 (eight) hours as needed for nausea or vomiting.    [provider]  Probiotic Product (PHILLIPS COLON HEALTH PO) Take 1 tablet by mouth daily.    [provider]  traMADol (ULTRAM) 50 MG tablet Take 1 tablet (50 mg total) by mouth every 6 (six) hours as needed for moderate pain. 06/05/18   Poggi, Excell Seltzer, MD  tretinoin (RETIN-A) 0.05 % cream Apply 1 application topically every other day. (alternate between finacea)    [provider]  vitamin B-12 (CYANOCOBALAMIN) 1000 MCG tablet Take 1,000 mcg by mouth daily.    [provider]    Allergies Codeine, Doxycycline, Morphine, Tramadol, Adhesive [tape], Amitriptyline, Ciprofloxacin, Clarithromycin, Erythromycin, Itraconazole, Nsaids, Penicillins, and Zoloft [sertraline hcl]  Family History  Problem Relation Age of Onset   Breast cancer Mother 40    Social History Social History   Tobacco Use   Smoking status: Never   Smokeless tobacco: Never   Vaping Use   Vaping Use: Never used  Substance Use Topics   Alcohol use: Yes    Alcohol/week: 7.0 standard drinks    Types: 7 Glasses of wine per week    Comment: 1 glass of wine   Drug use: Never    Review of Systems  Constitutional: No fever/chills Eyes: No visual changes. ENT: No sore throat. Cardiovascular: Denies chest pain. Respiratory: Denies shortness of breath. Gastrointestinal: No abdominal pain.  No nausea, no vomiting.  No diarrhea.  No constipation. Genitourinary: Negative for dysuria. Musculoskeletal: Positive for chronic left lower leg weakness.  Negative for back pain. Skin: Negative for rash. Neurological: Positive for altered mentation.  Negative for headaches, focal weakness or numbness.   ____________________________________________   PHYSICAL EXAM:  VITAL SIGNS: ED Triage Vitals  Enc Vitals Group     BP      Pulse  Resp      Temp      Temp src      SpO2      Weight      Height      Head Circumference      Peak Flow      Pain Score      Pain Loc      Pain Edu?      Excl. in GC?     Constitutional: Alert and oriented.  Elderly appearing and in mild acute distress. Eyes: Conjunctivae are normal. PERRL. EOMI. Head: Atraumatic. Nose: Atraumatic. Mouth/Throat: Mucous membranes are mildly dry.  No dental malocclusion. Neck: No stridor.  No cervical spine tenderness to palpation. Cardiovascular: Normal rate, regular rhythm. Grossly normal heart sounds.  Good peripheral circulation. Respiratory: Normal respiratory effort.  No retractions. Lungs CTAB. Gastrointestinal: Soft and nontender to light palpation. No distention. No abdominal bruits. No CVA tenderness. Musculoskeletal: Pelvis stable.  Left hip mildly tender to palpation.  No shortening or rotation noted.  No lower extremity tenderness nor edema.  No joint effusions. Neurologic: Alert and oriented to person and place.  Normal speech and language.  Left lower leg 4/5 strength compared  to the right.  Intermittent confusion.   Skin:  Skin is warm, dry and intact. No rash noted. Psychiatric: Mood and affect are normal. Speech and behavior are normal.  ____________________________________________   LABS (all labs ordered are listed, but only abnormal results are displayed)  Labs Reviewed  CBC WITH DIFFERENTIAL/PLATELET - Abnormal; Notable for the following components:      Result Value   RBC 3.30 (*)    HCT 34.2 (*)    MCV 103.6 (*)    MCH 36.7 (*)    RDW 11.2 (*)    All other components within normal limits  COMPREHENSIVE METABOLIC PANEL - Abnormal; Notable for the following components:   Sodium 130 (*)    Chloride 97 (*)    Glucose, Bld 145 (*)    All other components within normal limits  URINALYSIS, COMPLETE (UACMP) WITH MICROSCOPIC - Abnormal; Notable for the following components:   Color, Urine YELLOW (*)    APPearance HAZY (*)    Hgb urine dipstick SMALL (*)    Ketones, ur 5 (*)    Protein, ur 30 (*)    Nitrite POSITIVE (*)    Leukocytes,Ua SMALL (*)    Bacteria, UA RARE (*)    All other components within normal limits  URINE CULTURE  CULTURE, BLOOD (ROUTINE X 2)  CULTURE, BLOOD (ROUTINE X 2)  RESP PANEL BY RT-PCR (FLU A&B, COVID) ARPGX2  LACTIC ACID, PLASMA  LACTIC ACID, PLASMA  PROCALCITONIN  TROPONIN I (HIGH SENSITIVITY)   ____________________________________________  EKG  ED ECG REPORT I, Americus Perkey J, the attending physician, personally viewed and interpreted this ECG.   Date: 07/23/2021  EKG Time: 0535  Rate: 101  Rhythm: sinus tachycardia  Axis: Paced  Intervals:nonspecific intraventricular conduction delay  ST&T Change: Paced  ____________________________________________  RADIOLOGY Don Perking, personally viewed and evaluated these images (plain radiographs) as part of my medical decision making, as well as reviewing the written report by the radiologist.  ED MD interpretation: Pending  Official radiology report(s): No  results found.  ____________________________________________   PROCEDURES  Procedure(s) performed (including Critical Care):  .1-3 Lead EKG Interpretation  Date/Time: 07/23/2021 5:37 AM Performed by: Irean Hong, MD Authorized by: Irean Hong, MD     Interpretation: normal     ECG  rate:  80   ECG rate assessment: normal     Rhythm: sinus rhythm     Ectopy: none     Conduction: normal   Comments:     Patient placed on cardiac monitor to evaluate for arrhythmias   ____________________________________________   INITIAL IMPRESSION / ASSESSMENT AND PLAN / ED COURSE  As part of my medical decision making, I reviewed the following data within the electronic MEDICAL RECORD NUMBER Nursing notes reviewed and incorporated, Labs reviewed, EKG interpreted, Old chart reviewed, Radiograph reviewed, and Notes from prior ED visits     87 year old female presenting with altered mentation. Differential diagnosis includes, but is not limited to, alcohol, illicit or prescription medications, or other toxic ingestion; intracranial pathology such as stroke or intracerebral hemorrhage; fever or infectious causes including sepsis; hypoxemia and/or hypercarbia; uremia; trauma; endocrine related disorders such as diabetes, hypoglycemia, and thyroid-related diseases; hypertensive encephalopathy; etc.   Will obtain sepsis work-up, CT head, left hip x-ray.  I personally reviewed patient's most recent ED visit as well as her clinic visit from 07/21/2021.  It seems like left lower leg weakness has been a chronic issue.  Ataxia may be a newer issue.  She is intermittently confused on exam.  Given that patient lives alone, I anticipate she will require hospitalization as she would be an unsafe discharge home.  Clinical Course as of 07/23/21 5456  Caleen Essex Jul 23, 2021  [id number] Care transferred to oncoming provider pending results of respiratory panel and imaging studies.  Anticipate hospitalization for altered mentation,  gait instability, UTI. [JS]    Clinical Course User Index [JS] Irean Hong, MD     ____________________________________________   FINAL CLINICAL IMPRESSION(S) / ED DIAGNOSES  Final diagnoses:  Altered mental status, unspecified altered mental status type  Lower urinary tract infectious disease     ED Discharge Orders     None        Note:  This document was prepared using Dragon voice recognition software and may include unintentional dictation errors.    Irean Hong, MD 07/23/21 [phone]

## 2021-07-23 NOTE — Assessment & Plan Note (Signed)
Patient started on gentle IV fluids.  Repeat her chemistry in the morning.  Check TSH.  Patient is not on any diuretics at home.

## 2021-07-23 NOTE — Assessment & Plan Note (Signed)
Chronic. 

## 2021-07-23 NOTE — Progress Notes (Signed)
Notified MD of patient complaints. EKG completed. No new orders a this time.

## 2021-07-23 NOTE — H&P (Signed)
History and Physical    [name] ZOX:[id number] DOB: 1930/09/19 DOA: 07/23/2021  PCP: Marguarite Arbour, MD   Patient coming from: Home  I have personally briefly reviewed patient's old medical records in [facility]  CC: confusion, weakness HPI: 87 year old female with a history of hypertension, chronic venous insufficiency, reflux who presents to the ER today with a 3-day history of weakness, 1 day history of altered mental status.  Patient was seen last week in the ER due to altered mental status.  CT scan performed at that time was negative for stroke.  Patient was followed up with her primary care physician.  She had been complaining of some back spasms.  P.o. baclofen was ordered.  Patient's son(Ashia Dehner) states that when the patient was started on baclofen, he immediately noticed patient's mentation getting worse.  Patient only took 2 days of baclofen.  Last dose was yesterday.  Patient lives at home alone.  She has home health.  Prior to her altered mental status, the patient was still able to call her son and carry on activities of daily living with assistance.  Son states the patient's mentation was quite good.  Patient does have decreased hearing which she uses hearing aids.  Her last 48 hours, son has noticed increasing confusion.  No fevers.  Son states he saw his father with UTI and confusion.  Patient brought to the ER for evaluation.  In ER, CT head was negative for acute stroke.  Patient has permanent pacemaker and cannot have an MRI.  UA was positive for nitrites.  Serum sodium was low at 130.  Due to the patient's UTI, acute metabolic encephalopathy and hyponatremia, [facility] consulted for admission.   ED Course: pt seen in ER. Urine and blood cx obtained. Started on IV Rocephin. CT head negative.  Review of Systems:  Review of Systems  Unable to perform ROS: Mental status change   Past Medical History:  Diagnosis Date   Anemia    GERD (gastroesophageal  reflux disease)    Hypertension    Neuropathy    Peripheral vascular disease (HCC)    possible neuropathies in lower extremeties   PONV (postoperative nausea and vomiting)    happens sometimes but better with pre med of zofran   Presence of permanent cardiac pacemaker    Syncope     Past Surgical History:  Procedure Laterality Date   ABDOMINAL HYSTERECTOMY  1969   APPENDECTOMY  1969   with hysterectomy   BACK SURGERY  2001   rods in back. surgery on back x 3   COLONOSCOPY     I & D EXTREMITY Left 03/27/2018   Procedure: IRRIGATION AND DEBRIDEMENT LEFT ELBOW / OLECRANON BURSA;  Surgeon: Christena Flake, MD;  Location: [facility] ORS;  Service: Orthopedics;  Laterality: Left;   I & D EXTREMITY Left 06/05/2018   Procedure: IRRIGATION AND DEBRIDEMENT EXTREMITY;  Surgeon: Christena Flake, MD;  Location: [facility] ORS;  Service: Orthopedics;  Laterality: Left;   INSERT / REPLACE / REMOVE PACEMAKER  2004   JOINT REPLACEMENT Left 2004   partial knee replacement   OLECRANON BURSECTOMY Left 03/27/2018   Procedure: LEFT OLECRANON BURSA;  Surgeon: Christena Flake, MD;  Location: [facility] ORS;  Service: Orthopedics;  Laterality: Left;   PACEMAKER INSERTION  2014   dual chamber for complete heart block     reports that she has never smoked. She has never used smokeless tobacco. She reports current alcohol use of about  7.0 standard drinks per week. She reports that she does not use drugs.  Allergies  Allergen Reactions   Codeine Nausea And Vomiting   Doxycycline Other (See Comments)    Flu-like s/s   Morphine Nausea And Vomiting and Anxiety    Hallucinations and anxiety   Tramadol Nausea And Vomiting and Other (See Comments)    Causes poor balance   Adhesive [Tape] Rash   Amitriptyline Other (See Comments)    Caused nervousness. Patient states she has never taken this.   Ciprofloxacin Diarrhea   Clarithromycin Nausea Only    Flu like symptoms   Erythromycin Rash   Itraconazole Other (See Comments)     Elevated liver enzymes   Nsaids Nausea Only    Unknown   Penicillins Rash    Hives also. Doctor told her to never ever take pcn unless it is her only option   Zoloft [Sertraline Hcl] Other (See Comments)    Patient unaware of any reaction. Probably upset stomach    Family History  Problem Relation Age of Onset   Breast cancer Mother 57    Prior to Admission medications   Medication Sig Start Date End Date Taking? Authorizing Provider  acetaminophen (TYLENOL) 650 MG CR tablet Take 1,300 mg by mouth 2 (two) times daily.    [provider]  Ascorbic Acid (VITAMIN C) 1000 MG tablet Take 1,000 mg by mouth daily.    [provider]  Azelaic Acid (FINACEA) 15 % cream Apply 1 application topically every other day. (alternates between Retin-A)    [provider]  Calcium Carbonate-Vitamin D (CALCIUM-D) 600-400 MG-UNIT TABS Take 1 tablet by mouth 2 (two) times daily.    [provider]  Calcium Polycarbophil (FIBER-CAPS PO) Take 1 capsule by mouth at bedtime.     [provider]  Cholecalciferol (VITAMIN D-3) 5000 units TABS Take 5,000 Units by mouth daily.    [provider]  cyanocobalamin (,VITAMIN B-12,) 1000 MCG/ML injection Inject 1,000 mcg into the muscle every 30 (thirty) days.    [provider]  diclofenac sodium (VOLTAREN) 1 % GEL Apply 2-4 g topically 4 (four) times daily as needed (for back pain.).    [provider]  estradiol (ESTRACE) 0.1 MG/GM vaginal cream Place 1 Applicatorful vaginally at bedtime as needed (for vaginal discomfort).    [provider]  folic acid (FOLVITE) 1 MG tablet Take 2 mg by mouth daily.    [provider]  hydroxychloroquine (PLAQUENIL) 200 MG tablet Take 200 mg by mouth daily.    [provider]  methotrexate 50 MG/2ML injection Inject 25 mg into the muscle every Saturday. On hold until after surgery 08/21/17   [provider]  Misc Natural Products  (CALMME PO) Take 5 mLs by mouth at bedtime. Magnesium 1 tsp at night (dissolved in 4 oz. Water)    [provider]  Multiple Vitamin (MULTIVITAMIN) tablet Take 1 tablet by mouth daily.    [provider]  nebivolol (BYSTOLIC) 5 MG tablet Take 5 mg by mouth daily.    [provider]  Omega-3 Fatty Acids (FISH OIL) 1000 MG CAPS Take 1,000 mg by mouth 2 (two) times daily.    [provider]  omeprazole (PRILOSEC) 40 MG capsule Take 40 mg by mouth daily before breakfast.     [provider]  ondansetron (ZOFRAN) 4 MG tablet Take 4 mg by mouth every 8 (eight) hours as needed for nausea or vomiting.  [provider]  Probiotic Product (PHILLIPS [employer]ON HEALTH PO) Take 1 tablet by mouth daily.    [provider]  traMADol (ULTRAM) 50 MG tablet Take 1 tablet (50 mg total) by mouth every 6 (six) hours as needed for moderate pain. 06/05/18   Poggi, Excell Seltzer, MD  tretinoin (RETIN-A) 0.05 % cream Apply 1 application topically every other day. (alternate between finacea)    [provider]  vitamin B-12 (CYANOCOBALAMIN) 1000 MCG tablet Take 1,000 mcg by mouth daily.    [provider]    Physical Exam: Vitals:   07/23/21 0830 07/23/21 0955 07/23/21 1000 07/23/21 1030  BP: (!) 171/63 (!) 145/127 (!) 150/87 (!) 169/66  Pulse: 81 75 76 76  Resp: (!) 21  Temp:      TempSrc:      SpO2: 100% 98% 100% 100%  Weight:      Height:        Physical Exam Vitals and nursing note reviewed.  Constitutional:      General: She is not in acute distress.    Appearance: She is normal weight. She is not ill-appearing, toxic-appearing or diaphoretic.  HENT:     Head: Normocephalic and atraumatic.     Nose: Nose normal.  Cardiovascular:     Rate and Rhythm: Normal rate and regular rhythm.     Pulses: Normal pulses.     Heart sounds: Murmur heard.  Systolic murmur is present.  Pulmonary:     Effort: Pulmonary effort is normal. No  respiratory distress.     Breath sounds: No wheezing.  Abdominal:     General: Abdomen is flat. Bowel sounds are normal. There is no distension.     Palpations: Abdomen is soft.     Tenderness: There is no abdominal tenderness.  Musculoskeletal:     Right lower leg: No edema.     Left lower leg: No edema.  Skin:    Capillary Refill: Capillary refill takes less than 2 seconds.     Comments: Chronic venous insufficiency bilateral lower legs  Neurological:     General: No focal deficit present.     Mental Status: She is alert. She is disoriented.     Labs on Admission: I have personally reviewed following labs and imaging studies  CBC: Recent Labs  Lab 07/23/21 0527  WBC 8.6  NEUTROABS 6.9  HGB 12.1  HCT 34.2*  MCV 103.6*  PLT 250   Basic Metabolic Panel: Recent Labs  Lab 07/23/21 0527  NA 130*  K 4.4  CL 97*  CO2 23  GLUCOSE 145*  BUN 19  CREATININE 0.80  CALCIUM 9.3   GFR: Estimated Creatinine Clearance: 38.4 mL/min (by C-G formula based on SCr of 0.8 mg/dL). Liver Function Tests: Recent Labs  Lab 07/23/21 0527  AST 34  ALT 20  ALKPHOS 63  BILITOT 1.1  PROT 7.1  ALBUMIN 4.1   No results for input(s): LIPASE, AMYLASE in the last 168 hours. No results for input(s): AMMONIA in the last 168 hours. Coagulation Profile: No results for input(s): INR, PROTIME in the last 168 hours. Cardiac Enzymes: No results for input(s): CKTOTAL, CKMB, CKMBINDEX, TROPONINI in the last 168 hours. BNP (last 3 results) No results for input(s): PROBNP in the last 8760 hours. HbA1C: No results for input(s): HGBA1C in the last 72 hours. CBG: No results for input(s): GLUCAP in the last 168 hours. Lipid Profile: No results for input(s): CHOL, HDL, LDLCALC, TRIG, CHOLHDL, LDLDIRECT in  the last 72 hours. Thyroid Function Tests: No results for input(s): TSH, T4TOTAL, FREET4, T3FREE, THYROIDAB in the last 72 hours. Anemia Panel: No results for input(s): VITAMINB12, FOLATE,  FERRITIN, TIBC, IRON, RETICCTPCT in the last 72 hours. Urine analysis:    Component Value Date/Time   COLORURINE YELLOW (A) 07/23/2021 0527   APPEARANCEUR HAZY (A) 07/23/2021 0527   LABSPEC 1.012 07/23/2021 0527   PHURINE 7.0 07/23/2021 0527   GLUCOSEU NEGATIVE 07/23/2021 0527   HGBUR SMALL (A) 07/23/2021 0527   BILIRUBINUR NEGATIVE 07/23/2021 0527   KETONESUR 5 (A) 07/23/2021 0527   PROTEINUR 30 (A) 07/23/2021 0527   NITRITE POSITIVE (A) 07/23/2021 0527   LEUKOCYTESUR SMALL (A) 07/23/2021 0527    Radiological Exams on Admission: I have personally reviewed images DG Chest 1 View  Result Date: 07/23/2021 CLINICAL DATA:  87 year old female with left lower extremity weakness and hip pain. EXAM: CHEST  1 VIEW COMPARISON:  Chest radiographs 09/09/2013 and earlier. FINDINGS: Chronic dextroconvex thoracic scoliosis. Prior cervical ACDF and thoracolumbar junction spinal fusion. Mildly lower lung volumes since 2014. Borderline to mild cardiomegaly. Chronic dual lead left chest cardiac pacemaker. Allowing for portable technique the lungs are clear. No pneumothorax, pulmonary edema or pleural effusion. Negative visible bowel gas pattern. IMPRESSION: No acute cardiopulmonary abnormality. Electronically Signed   By: Odessa Fleming M.D.   On: 07/23/2021 07:36   CT Head Wo Contrast  Result Date: 07/23/2021 CLINICAL DATA:  87 year old female with altered mental status. Left leg weakness. EXAM: CT HEAD WITHOUT CONTRAST TECHNIQUE: Contiguous axial images were obtained from the base of the skull through the vertex without intravenous contrast. COMPARISON:  Brain MRI 07/27/2010.  Head CT 07/16/2021. FINDINGS: Brain: Stable cerebral volume. No midline shift, ventriculomegaly, mass effect, evidence of mass lesion, intracranial hemorrhage or evidence of cortically based acute infarction. Patchy and confluent bilateral white matter hypodensity appears stable. Vascular: Extensive Calcified atherosclerosis at the skull base.  No suspicious intracranial vascular hyperdensity. Skull: No acute osseous abnormality identified. Sinuses/Orbits: Continued opacification of the left sphenoid sinus with periosteal thickening. Other Visualized paranasal sinuses and mastoids are stable and well aerated. Tympanic cavities appear clear. Other: No acute orbit or scalp soft tissue finding. IMPRESSION: 1. No acute intracranial abnormality. Stable CT appearance ofVolume loss and advanced white matter changes. 2. Chronic left sphenoid sinusitis. Electronically Signed   By: Odessa Fleming M.D.   On: 07/23/2021 06:51   DG Hip Unilat With Pelvis 2-3 Views Left  Result Date: 07/23/2021 CLINICAL DATA:  87 year old female with left lower extremity weakness and hip pain. EXAM: DG HIP (WITH OR WITHOUT PELVIS) 2-3V LEFT COMPARISON:  CT Abdomen and Pelvis 01/27/2015. FINDINGS: Extensive chronic lower lumbar through right sacroiliac fusion hardware partially redemonstrated femoral heads are normally located. Grossly intact proximal right femur. Pelvis appears stable and intact. No proximal left femur fracture. Chronic increased femoral head sclerosis raising the possibility of chronic AVN, but no acute osseous abnormality identified. Calcified femoral artery atherosclerosis. Negative visible bowel gas pattern. IMPRESSION: 1. Questionable chronic femoral head AVN but No acute osseous abnormality identified about the left hip or pelvis. 2. Extensive prior spinal fusion. 3. Calcified femoral artery atherosclerosis. Electronically Signed   By: Odessa Fleming M.D.   On: 07/23/2021 07:35    EKG: I have personally reviewed EKG: v-paced rhythm   Assessment/Plan Principal Problem:   Acute cystitis without hematuria Active Problems:   Acute metabolic encephalopathy   Hyponatremia   Essential hypertension   Chronic venous insufficiency   Gastro-esophageal reflux  disease without esophagitis    Acute cystitis without hematuria Admit to observation status.  Continue IV  Rocephin.  Urine culture sent.  Blood culture sent from the ER.  Repeat labs in the morning.  Acute metabolic encephalopathy Multifactorial nature.  Could be due to recent baclofen administration versus UTI.  We will stop baclofen.  Continue IV antibiotics.  We will see if her mentation clears up after treatment.  Patient is not taking any other mind altering medications.  Son states the patient is no longer taking Ultram.  Hyponatremia Patient started on gentle IV fluids.  Repeat her chemistry in the morning.  Check TSH.  Patient is not on any diuretics at home.  Essential hypertension Stable.  Chronic venous insufficiency Chronic.  Gastro-esophageal reflux disease without esophagitis Stable.  DVT prophylaxis: Lovenox Code Status: Full Code Family Communication: discussed with pt's son Minerva Areola at bedside  Disposition Plan: plan on returning home with home health(already established with [facility])  Consults called: none  Admission status: Observation, Med-Surg   Carollee Herter, DO [facility] 07/23/2021, 10:39 AM

## 2021-07-23 NOTE — Assessment & Plan Note (Signed)
Admit to observation status.  Continue IV Rocephin.  Urine culture sent.  Blood culture sent from the ER.  Repeat labs in the morning.

## 2021-07-23 NOTE — Progress Notes (Signed)
Patient c/o right arm pain with nausea. EKG completed. MD paged

## 2021-07-23 NOTE — ED Notes (Signed)
Pt family flagged down staff and advised he doesn't like the sound of the alarms and he hit multiple buttons to get it to be quiet. This RN advised him to please NOT touch the monitor as it alarms [place] of abnormalities.

## 2021-07-23 NOTE — ED Provider Notes (Signed)
-----------------------------------------   7:09 AM on 07/23/2021 -----------------------------------------  Blood pressure (!) 139/111, pulse 80, temperature 99 F (37.2 C), temperature source Oral, resp. rate (!) 21, height 5' (1.524 m), weight 56.7 kg, SpO2 100 %.  Assuming care from Dr. [name] Frame.  In short, [name] is a 87 y.o. female with a chief complaint of Altered Mental Status .  Refer to the original H&P for additional details.  The current plan of care is to follow-up imaging and additional labs, plan for admission for confusion, UTI, and weakness.  ----------------------------------------- 8:22 AM on 07/23/2021 ----------------------------------------- Chest x-ray reviewed by me with no infiltrate, edema, or effusion.  Left hip x-ray with questionable chronic AVN but with no acute changes.  Case discussed with hospitalist for admission.    Chesley Noon, MD 07/23/21 [phone]

## 2021-07-24 DIAGNOSIS — J449 Chronic obstructive pulmonary disease, unspecified: Secondary | ICD-10-CM | POA: Diagnosis present

## 2021-07-24 DIAGNOSIS — G928 Other toxic encephalopathy: Secondary | ICD-10-CM | POA: Diagnosis present

## 2021-07-24 DIAGNOSIS — K219 Gastro-esophageal reflux disease without esophagitis: Secondary | ICD-10-CM

## 2021-07-24 DIAGNOSIS — G9341 Metabolic encephalopathy: Secondary | ICD-10-CM | POA: Diagnosis not present

## 2021-07-24 DIAGNOSIS — T428X5A Adverse effect of antiparkinsonism drugs and other central muscle-tone depressants, initial encounter: Secondary | ICD-10-CM | POA: Diagnosis present

## 2021-07-24 DIAGNOSIS — Z90710 Acquired absence of both cervix and uterus: Secondary | ICD-10-CM | POA: Diagnosis not present

## 2021-07-24 DIAGNOSIS — Z79899 Other long term (current) drug therapy: Secondary | ICD-10-CM | POA: Diagnosis not present

## 2021-07-24 DIAGNOSIS — I739 Peripheral vascular disease, unspecified: Secondary | ICD-10-CM | POA: Diagnosis present

## 2021-07-24 DIAGNOSIS — G629 Polyneuropathy, unspecified: Secondary | ICD-10-CM | POA: Diagnosis present

## 2021-07-24 DIAGNOSIS — I10 Essential (primary) hypertension: Secondary | ICD-10-CM | POA: Diagnosis present

## 2021-07-24 DIAGNOSIS — I872 Venous insufficiency (chronic) (peripheral): Secondary | ICD-10-CM | POA: Diagnosis present

## 2021-07-24 DIAGNOSIS — M069 Rheumatoid arthritis, unspecified: Secondary | ICD-10-CM | POA: Diagnosis present

## 2021-07-24 DIAGNOSIS — E871 Hypo-osmolality and hyponatremia: Secondary | ICD-10-CM | POA: Diagnosis present

## 2021-07-24 DIAGNOSIS — D539 Nutritional anemia, unspecified: Secondary | ICD-10-CM | POA: Diagnosis present

## 2021-07-24 DIAGNOSIS — Z950 Presence of cardiac pacemaker: Secondary | ICD-10-CM | POA: Diagnosis not present

## 2021-07-24 DIAGNOSIS — R531 Weakness: Secondary | ICD-10-CM | POA: Diagnosis present

## 2021-07-24 DIAGNOSIS — Z20822 Contact with and (suspected) exposure to covid-19: Secondary | ICD-10-CM | POA: Diagnosis present

## 2021-07-24 DIAGNOSIS — Z803 Family history of malignant neoplasm of breast: Secondary | ICD-10-CM | POA: Diagnosis not present

## 2021-07-24 DIAGNOSIS — N3000 Acute cystitis without hematuria: Secondary | ICD-10-CM | POA: Diagnosis present

## 2021-07-24 LAB — URINE CULTURE

## 2021-07-24 LAB — COMPREHENSIVE METABOLIC PANEL
ALT: 20 U/L (ref 0–44)
AST: 36 U/L (ref 15–41)
Albumin: 3.7 g/dL (ref 3.5–5.0)
Alkaline Phosphatase: 56 U/L (ref 38–126)
Anion gap: 8 (ref 5–15)
BUN: 10 mg/dL (ref 8–23)
CO2: 26 mmol/L (ref 22–32)
Calcium: 8.8 mg/dL — ABNORMAL LOW (ref 8.9–10.3)
Chloride: 100 mmol/L (ref 98–111)
Creatinine, Ser: 0.69 mg/dL (ref 0.44–1.00)
GFR, Estimated: >60 mL/min (ref >60)
Glucose, Bld: 93 mg/dL (ref 70–99)
Potassium: 3.9 mmol/L (ref 3.5–5.1)
Sodium: 134 mmol/L — ABNORMAL LOW (ref 135–145)
Total Bilirubin: 1.1 mg/dL (ref 0.3–1.2)
Total Protein: 6.4 g/dL — ABNORMAL LOW (ref 6.5–8.1)

## 2021-07-24 LAB — CBC WITH DIFFERENTIAL/PLATELET
Abs Immature Granulocytes: 0.04 10*3/uL (ref 0.00–0.07)
Basophils Absolute: 0.1 10*3/uL (ref 0.0–0.1)
Basophils Relative: 1 %
Eosinophils Absolute: 0.1 10*3/uL (ref 0.0–0.5)
Eosinophils Relative: 1 %
HCT: 32.7 % — ABNORMAL LOW (ref 36.0–46.0)
Hemoglobin: 11.7 g/dL — ABNORMAL LOW (ref 12.0–15.0)
Immature Granulocytes: 0 %
Lymphocytes Relative: 14 %
Lymphs Abs: 1.3 10*3/uL (ref 0.7–4.0)
MCH: 36.7 pg — ABNORMAL HIGH (ref 26.0–34.0)
MCHC: 35.8 g/dL (ref 30.0–36.0)
MCV: 102.5 fL — ABNORMAL HIGH (ref 80.0–100.0)
Monocytes Absolute: 1.5 10*3/uL — ABNORMAL HIGH (ref 0.1–1.0)
Monocytes Relative: 16 %
Neutro Abs: 6.6 10*3/uL (ref 1.7–7.7)
Neutrophils Relative %: 68 %
Platelets: 223 10*3/uL (ref 150–400)
RBC: 3.19 MIL/uL — ABNORMAL LOW (ref 3.87–5.11)
RDW: 11.2 % — ABNORMAL LOW (ref 11.5–15.5)
WBC: 9.6 10*3/uL (ref 4.0–10.5)
nRBC: 0 % (ref 0.0–0.2)

## 2021-07-24 LAB — MAGNESIUM: Magnesium: 2 mg/dL (ref 1.7–2.4)

## 2021-07-24 LAB — RESP PANEL BY RT-PCR (FLU A&B, COVID) ARPGX2
Influenza A by PCR: NEGATIVE
Influenza B by PCR: NEGATIVE
SARS Coronavirus 2 by RT PCR: NEGATIVE

## 2021-07-24 MED ORDER — FOLIC ACID 1 MG PO TABS
2.0000 mg | ORAL_TABLET | Freq: Every day | ORAL | Status: DC
  Administered 2021-07-24 – 2021-07-27 (×4): 2 mg via ORAL
  Filled 2021-07-24 (×4): qty 2

## 2021-07-24 NOTE — Progress Notes (Signed)
According to notes, patient presents with confusion. PT recommends SNF. CSW left a VM for son requesting a return call to discuss recommendation.

## 2021-07-24 NOTE — Progress Notes (Signed)
Patient ID: [name], female   DOB: 1930-11-04, 87 y.o.   MRN: [id number] [facility] PROGRESS NOTE  [name] [id number] DOB: [birth year] DOA: 07/23/2021 PCP: Marguarite Arbour, MD  HPI/Subjective: Patient brought in with confusion and weakness.  Patient has difficulty hearing and goes off on tangents when I try to ask her specific questions.  Hard to get any history from her.  No family at bedside and left a message for the patient's son on the phone.  Patient found to have an acute cystitis.  Objective: Vitals:   07/24/21 0502 07/24/21 0759  BP: (!) 161/60 (!) 151/60  Pulse: 81 68  Resp: 16 18  Temp: 98.7 F (37.1 C) 98.2 F (36.8 C)  SpO2: 96% 98%   No intake or output data in the 24 hours ending 07/24/21 1249 Filed Weights   07/23/21 0528  Weight: 56.7 kg    ROS: Review of Systems  Respiratory:  Negative for shortness of breath.   Cardiovascular:  Negative for chest pain.  Gastrointestinal:  Positive for abdominal pain. Negative for nausea and vomiting.  Genitourinary:  Negative for dysuria.  Exam: Physical Exam HENT:     Head: Normocephalic.     Mouth/Throat:     Pharynx: No oropharyngeal exudate.  Eyes:     General: Lids are normal.     Conjunctiva/sclera: Conjunctivae normal.     Pupils: Pupils are equal, round, and reactive to light.  Cardiovascular:     Rate and Rhythm: Normal rate and regular rhythm.     Heart sounds: Normal heart sounds, S1 normal and S2 normal.  Pulmonary:     Breath sounds: Normal breath sounds. No decreased breath sounds, wheezing, rhonchi or rales.  Abdominal:     Palpations: Abdomen is soft.     Tenderness: There is abdominal tenderness in the suprapubic area.  Musculoskeletal:     Right lower leg: No swelling.     Left lower leg: No swelling.  Skin:    General: Skin is warm.     Findings: No rash.  Neurological:     Mental Status: She is alert.     Comments: Answer some questions but goes off on tangents and  hard to focus.      Scheduled Meds:  enoxaparin (LOVENOX) injection  40 mg Subcutaneous Q24H   folic acid  2 mg Oral Daily   pantoprazole  40 mg Oral Daily   propranolol ER  60 mg Oral Daily   Continuous Infusions:  cefTRIAXone (ROCEPHIN)  IV 1 g (07/24/21 1000)    Assessment/Plan:  Acute metabolic encephalopathy.  Could be secondary to baclofen versus acute cystitis.  Continue to hold baclofen and anything that can cause altered mental status.  Continue antibiotics for acute cystitis.  Would like to speak with family and have family assess her prior to making any dispositions. Acute cystitis without hematuria.  Continue Rocephin.  Initial urine culture showing multiple species.  We will send off another urine culture. Hyponatremia.  Sodium just a few points lower than the normal range at 130 and improved to 134 with IV fluids. Weakness.  Physical therapy evaluation Essential hypertension on propranolol ER GERD on Protonix Chronic venous insufficiency     Code Status:     Code Status Orders  (From admission, onward)           Start     Ordered   07/23/21 1131  Full code  Continuous  07/23/21 1130           Code Status History     Date Active Date Inactive Code Status Order ID Comments User Context   07/23/2021 1031 07/23/2021 1130 Full Code [id number]  Carollee Herter, DO ED   06/05/2018 1201 06/05/2018 1632 Full Code [id number]  Christena Flake, MD Inpatient   03/27/2018 1839 03/28/2018 1618 Full Code [id number]  Poggi, Excell Seltzer, MD Inpatient      Family Communication: Left message for son Disposition Plan: Status is: Inpatient  Dispo: The patient is from: Home              Anticipated d/c is to: Home depending on clinical course              Patient currently being treated for acute metabolic encephalopathy and acute cystitis   Difficult to place patient.  No.  Antibiotics: Rocephin  Time spent: 27 minutes  Brendyn [facility] [employer]

## 2021-07-24 NOTE — Evaluation (Signed)
Physical Therapy Evaluation Patient Details Name: [name] MRN: [id number] DOB: 12/31/1929 Today's Date: 07/24/2021   History of Present Illness  Per ED/MD Notes:  [name] is a 87 y.o. female brought to the ED via EMS from home with a chief complaint of altered mentation.  Patient with a history of hypertension, COPD, neuropathy who was seen in the ED 07/16/2021 and by her PCP 07/21/2021 for chronic left lower leg weakness now progressing and associated with ataxia.  She was referred by her PCP to neurology.  Patient lives alone and hit her life alert button for left lower leg weakness and gait instability while trying to ambulate to the restroom.  She was found by EMS to be confused.  Denies fall/injury/trauma.  Denies fever, cough, chest pain, shortness of breath, abdominal pain, nausea, vomiting or dizziness.    Clinical Impression  Pt received in Semi-Fowler's position and agreeable to therapy.  Pt confused about situation upon arrival and continuously speaks about her dog.  Pt is A&O x2, with ability to give her name and DOB.  Pt believes she is in a dog-friendly hotel and that her DIL is bringing her puppy at any moment, which is why she calls out the dog's name throughout session.  Pt agreed to performing bed-level exercises and notes that she is soiled and states she has been for several hours.  Pt agreed to attempt to stand up so therapist could assist with bedding change.  Pt able to stand with modA and multiple attempts with use of FWW, however is unable to keep her balance and has uncontrolled descent back to the bed.  Therapist encouraged her to remain upright with use of FWW, however pt attempts to pick walker up and move it forward rather than standing still.  Pt requires consistent verbal cuing and is able to stand with CGA while therapist changed bedding.  Pt then encouraged to take side-steps towards the head of bed for proper positioning in the bed.  Pt requires assistance from  therapist in order to navigate the walker without having uncontrolled descent.  Pt has ataxic gait pattern is very unstable.  Pt is able to perform marches in place, however requires modA to remain upright when performing.  PT then transferred back to bed, positioned for comfort and given call bell and all needs met.  MD in room at conclusion of session.  Nursing notified of bedding change and pt no longer having a brief due to it being completely soiled.  Pt will benefit from skilled PT intervention to increase independence and safety with basic mobility in preparation for discharge to the venue listed below.         Follow Up Recommendations SNF    Equipment Recommendations  Rolling walker with 5" wheels;3in1 (PT)    Recommendations for Other Services       Precautions / Restrictions Precautions Precautions: None Restrictions Weight Bearing Restrictions: No      Mobility  Bed Mobility Overal bed mobility: Needs Assistance Bed Mobility: Supine to Sit     Supine to sit: Mod assist     General bed mobility comments: Pt requires HHA for coming up into sitting position and multimodal cuing for maintaining uprught posture seated at EOB.  Pt tends to have a posterior lean, but is able to correct when cued.    Transfers Overall transfer level: Needs assistance Equipment used: Rolling walker (2 wheeled) Transfers: Sit to/from Stand Sit to Stand: Mod assist  General transfer comment: Pt requires modA from therapist to come inot standing, mostly with navigating the walker and making sure the patient does not fall posteriorly or allow the walker to move while pt is in standing.  Ambulation/Gait Ambulation/Gait assistance: Mod assist Gait Distance (Feet): 3 Feet Assistive device: Rolling walker (2 wheeled) Gait Pattern/deviations: Step-to pattern;Decreased step length - right;Decreased step length - left;Decreased stance time - left;Decreased weight shift to  left;Ataxic Gait velocity: decreased   General Gait Details: Pt too unsteady to leave edge of bed.  Pt demonstrates ataxic gait pattern with "shaking" movements and lack of posterior control wanting to lower herself posteriorly throughout the session.  Stairs            Wheelchair Mobility    Modified Rankin (Stroke Patients Only)       Balance Overall balance assessment: Needs assistance Sitting-balance support: Bilateral upper extremity supported;Feet supported Sitting balance-Leahy Scale: Poor Sitting balance - Comments: Pt unable to obtian or keep upright posture without UE support. Postural control: Posterior lean Standing balance support: Bilateral upper extremity supported Standing balance-Leahy Scale: Poor Standing balance comment: Pt unable to remain upright without modA from therapist to prevent uncontrolled descent.                             Pertinent Vitals/Pain Pain Assessment: No/denies pain    Home Living Family/patient expects to be discharged to:: Private residence Living Arrangements: Alone;Other (Comment) (pt notes that she lives alone but has dog walkers that check in on her.) Available Help at Discharge: Family Type of Home: House Home Access: Stairs to enter Entrance Stairs-Rails: Right Entrance Stairs-Number of Steps: 4 Home Layout: Two level;Able to live on main level with bedroom/bathroom Home Equipment: [name] - 2 wheels;Cane - single point Additional Comments: All information above pulled from past.  Pt is poor historian and difficult to direct conversation with.    Prior Function Level of Independence: Independent         Comments: Pt notes having 1 fall in the past year.     Hand Dominance   Dominant Hand: Right    Extremity/Trunk Assessment   Upper Extremity Assessment Upper Extremity Assessment: Generalized weakness    Lower Extremity Assessment Lower Extremity Assessment: Generalized weakness;LLE  deficits/detail LLE Deficits / Details: Pt has severe L LE weakness and has difficulty standing on this LE for a prolonged period of time.       Communication   Communication: No difficulties  Cognition Arousal/Alertness: Awake/alert Behavior During Therapy: WFL for tasks assessed/performed Overall Cognitive Status: No family/caregiver present to determine baseline cognitive functioning                                 General Comments: Pt is only AxO x2, noting that she is housed in a hotel that allows pets.  Pt talks a lot about random things throughout session.  Very attached to dog Clemmy.      General Comments      Exercises Total Joint Exercises Ankle Circles/Pumps: AROM;Strengthening;Both;10 reps;Supine Quad Sets: AROM;Strengthening;Both;10 reps;Supine Gluteal Sets: AROM;Strengthening;Both;10 reps;Supine Heel Slides: AROM;Strengthening;Both;10 reps;Supine Straight Leg Raises: AROM;Strengthening;Both;10 reps;Supine Marching in Standing: AROM;Strengthening;Both;5 reps;Standing Other Exercises Other Exercises: Pt educated on role of PT and the services provided during hospital stay.  Pt also educated on the importance of exercise throughout hospital stay to decrease muscle atrophy.   Assessment/Plan  PT Assessment Patient needs continued PT services  PT Problem List Decreased strength;Decreased activity tolerance;Decreased balance;Decreased mobility;Decreased coordination;Decreased cognition;Decreased knowledge of use of DME;Decreased safety awareness       PT Treatment Interventions DME instruction;Gait training;Functional mobility training;Therapeutic activities;Therapeutic exercise;Balance training;Neuromuscular re-education    PT Goals (Current goals can be found in the Care Plan section)  Acute Rehab PT Goals Patient Stated Goal: to see dog, Clemmy. PT Goal Formulation: With patient Time For Goal Achievement: 08/07/21 Potential to Achieve Goals:  Poor    Frequency Min 2X/week   Barriers to discharge Inaccessible home environment According to pt, she lives alone.  Given the confusion of the pt, living alone is not safe at this time.    Co-evaluation               AM-PAC PT "6 Clicks" Mobility  Outcome Measure Help needed turning from your back to your side while in a flat bed without using bedrails?: A Lot Help needed moving from lying on your back to sitting on the side of a flat bed without using bedrails?: A Lot Help needed moving to and from a bed to a chair (including a wheelchair)?: A Lot Help needed standing up from a chair using your arms (e.g., wheelchair or bedside chair)?: A Lot Help needed to walk in hospital room?: Total Help needed climbing 3-5 steps with a railing? : Total 6 Click Score: 10    End of Session Equipment Utilized During Treatment: Gait belt Activity Tolerance: Patient limited by fatigue Patient left: in bed;with call bell/phone within reach;with bed alarm set;Other (comment) (with MD in room.) Nurse Communication: Mobility status PT Visit Diagnosis: Unsteadiness on feet (R26.81);Other abnormalities of gait and mobility (R26.89);Repeated falls (R29.6);Muscle weakness (generalized) (M62.81);History of falling (Z91.81);Difficulty in walking, not elsewhere classified (R26.2)    Time: 0340-3524 PT Time Calculation (min) (ACUTE ONLY): 40 min   Charges:   PT Evaluation $PT Eval Low Complexity: 1 Low PT Treatments $Therapeutic Exercise: 23-37 mins $Therapeutic Activity: 8-22 mins        Gwenlyn Saran, PT, DPT 07/24/21, 2:40 PM   Christie Nottingham 07/24/2021, 2:31 PM

## 2021-07-25 DIAGNOSIS — M069 Rheumatoid arthritis, unspecified: Secondary | ICD-10-CM

## 2021-07-25 DIAGNOSIS — R531 Weakness: Secondary | ICD-10-CM

## 2021-07-25 MED ORDER — HYDROXYCHLOROQUINE SULFATE 200 MG PO TABS
200.0000 mg | ORAL_TABLET | Freq: Every day | ORAL | Status: DC
  Administered 2021-07-25 – 2021-07-27 (×3): 200 mg via ORAL
  Filled 2021-07-25 (×3): qty 1

## 2021-07-25 MED ORDER — AMLODIPINE BESYLATE 5 MG PO TABS
5.0000 mg | ORAL_TABLET | Freq: Every day | ORAL | Status: DC
  Administered 2021-07-25 – 2021-07-27 (×3): 5 mg via ORAL
  Filled 2021-07-25 (×3): qty 1

## 2021-07-25 NOTE — TOC Initial Note (Signed)
Transition of Care [facility]) - Initial/Assessment Note    Patient Details  Name: [name] MRN: [id number] Date of Birth: 1930-04-05  [facility]) CM/SW Contact:    Liliana Cline, LCSW Phone Number: 07/25/2021, 10:50 AM  Clinical Narrative:            CSW spoke to patient's son Kyrsten Deleeuw regarding discharge planning. Patient lives alone but currently has a private paid sitter during the day. Family is working on setting up 24 hour private pay care at home. PCP is Dr. Judithann Sheen. [facility]acy [facility]. No SNF history. Patient has a RW and wheelchair at home. Patient has had 2 covid vaccines, has not had the booster. Son or sitter provide transportation for patient. Patient's son is agreeable to SNF recommendation from PT. Prefers the [place] area. Would like to review Medicare.gov ratings of bed offers when available. Prefers TOC send bed offers to him via email [email or web address]). TOC started SNF workup.    Expected Discharge Plan: Skilled Nursing Facility Barriers to Discharge: Continued Medical Work up   Patient Goals and CMS Choice Patient states their goals for this hospitalization and ongoing recovery are:: SNF CMS Medicare.gov Compare Post Acute Care list provided to:: Patient Represenative (must comment) Choice offered to / list presented to : Adult Children  Expected Discharge Plan and Services Expected Discharge Plan: Skilled Nursing Facility       Living arrangements for the past 2 months: Single Family Home                                      Prior Living Arrangements/Services Living arrangements for the past 2 months: Single Family Home Lives with:: Self Patient language and need for interpreter reviewed:: Yes Do you feel safe going back to the place where you live?: Yes      Need for Family Participation in Patient Care: Yes (Comment) Care giver support system in place?: Yes (comment) Current home services: Sitter Criminal  Activity/Legal Involvement Pertinent to Current Situation/Hospitalization: No - Comment as needed  Activities of Daily Living Home Assistive Devices/Equipment: Environmental consultant (specify type) ADL Screening (condition at time of admission) Patient's cognitive ability adequate to safely complete daily activities?: No Is the patient deaf or have difficulty hearing?: No Does the patient have difficulty seeing, even when wearing glasses/contacts?: No Does the patient have difficulty concentrating, remembering, or making decisions?: Yes Patient able to express need for assistance with ADLs?: Yes Does the patient have difficulty dressing or bathing?: Yes Independently performs ADLs?: No Communication: Independent Dressing (OT): Needs assistance Is this a change from baseline?: Pre-admission baseline Grooming: Independent Feeding: Independent Bathing: Needs assistance Is this a change from baseline?: Pre-admission baseline Toileting: Needs assistance Is this a change from baseline?: Pre-admission baseline In/Out Bed: Needs assistance Is this a change from baseline?: Pre-admission baseline Walks in Home: Needs assistance Is this a change from baseline?: Pre-admission baseline Does the patient have difficulty walking or climbing stairs?: Yes Weakness of Legs: Both Weakness of Arms/Hands: Both  Permission Sought/Granted Permission sought to share information with : Facility Industrial/product designer granted to share information with : Yes, Verbal Permission Granted (by son)     Permission granted to share info w AGENCY: SNFs        Emotional Assessment       Orientation: : Oriented to Self Alcohol / Substance Use: Not Applicable Psych Involvement: No (comment)  Admission diagnosis:  Lower urinary tract infectious disease [N39.0] Acute cystitis without hematuria [N30.00] Altered mental status, unspecified altered mental status type [R41.82] Acute metabolic encephalopathy  [G93.41] Patient Active Problem List   Diagnosis Date Noted   Acute cystitis without hematuria 07/23/2021   Acute metabolic encephalopathy 07/23/2021   Hyponatremia 07/23/2021   Septic olecranon bursitis of left elbow 03/27/2018   Lymphedema 10/11/2017   Chronic venous insufficiency 10/11/2017   Leg pain 10/11/2017   Leg swelling 10/11/2017   COPD (chronic obstructive pulmonary disease) (HCC) 10/11/2017   Essential hypertension 10/11/2017   Gastro-esophageal reflux disease without esophagitis 11/26/2015   Rheumatoid arthritis (HCC) 05/11/2011   PCP:  Marguarite Arbour, MD Pharmacy:   [facility] - [place], [place] - [address] [place] [address] Phone: [phone] Fax: [phone]     Social Determinants of Health (SDOH) Interventions    Readmission Risk Interventions Readmission Risk Prevention Plan 07/25/2021  Transportation Screening Complete  PCP or Specialist Appt within 5-7 Days Complete  Home Care Screening Complete  Medication Review (RN CM) Complete  Some recent data might be hidden

## 2021-07-25 NOTE — NC FL2 (Signed)
[place] MEDICAID FL2 LEVEL OF CARE SCREENING TOOL     IDENTIFICATION  Patient Name: [name] Birthdate: [birth year] Sex: female Admission Date (Current Location): 07/23/2021  [facility] and [place] Number:  Chiropodist and Address:  [facility], [address], [place], [place] [address]      Provider Number: [id number]  Attending Physician Name and Address:  Alford Highland, MD  Relative Name and Phone Number:       Current Level of Care: Hospital Recommended Level of Care: Skilled Nursing Facility Prior Approval Number:    Date Approved/Denied:   PASRR Number: [id number]  Discharge Plan:      Current Diagnoses: Patient Active Problem List   Diagnosis Date Noted   Acute cystitis without hematuria 07/23/2021   Acute metabolic encephalopathy 07/23/2021   Hyponatremia 07/23/2021   Septic olecranon bursitis of left elbow 03/27/2018   Lymphedema 10/11/2017   Chronic venous insufficiency 10/11/2017   Leg pain 10/11/2017   Leg swelling 10/11/2017   COPD (chronic obstructive pulmonary disease) (HCC) 10/11/2017   Essential hypertension 10/11/2017   Gastro-esophageal reflux disease without esophagitis 11/26/2015   Rheumatoid arthritis (HCC) 05/11/2011    Orientation RESPIRATION BLADDER Height & Weight     Self  Normal Incontinent Weight: 125 lb (56.7 kg) Height:  5' (152.4 cm)  BEHAVIORAL SYMPTOMS/MOOD NEUROLOGICAL BOWEL NUTRITION STATUS        Diet (regular; thin liquids)  AMBULATORY STATUS COMMUNICATION OF NEEDS Skin   Extensive Assist Verbally Normal                       Personal Care Assistance Level of Assistance  Bathing, Feeding, Dressing Bathing Assistance: Maximum assistance Feeding assistance: Limited assistance Dressing Assistance: Maximum assistance     Functional Limitations Info             SPECIAL CARE FACTORS FREQUENCY  PT (By licensed PT), OT (By licensed OT)     PT Frequency: 5 x/week OT  Frequency: 5 x/week            Contractures      Additional Factors Info  Code Status, Allergies Code Status Info: full Allergies Info: Codeine, Doxycycline, Morphine, Tramadol, Adhesive (Tape), Amitriptyline, Ciprofloxacin, Clarithromycin, Erythromycin, Itraconazole, Nsaids, Penicillins, Zoloft (Sertraline Hcl)           Current Medications (07/25/2021):  This is the current hospital active medication list Current Facility-Administered Medications  Medication Dose Route Frequency Provider Last Rate Last Admin   acetaminophen (TYLENOL) tablet 650 mg  650 mg Oral Q6H PRN Carollee Herter, DO   650 mg at 07/24/21 2245   Or   acetaminophen (TYLENOL) suppository 650 mg  650 mg Rectal Q6H PRN Carollee Herter, DO       amLODipine (NORVASC) tablet 5 mg  5 mg Oral Daily Wieting, Richard, MD   5 mg at 07/25/21 0950   cefTRIAXone (ROCEPHIN) 1 g in sodium chloride 0.9 % 100 mL IVPB  1 g Intravenous Q24H Carollee Herter, DO 200 mL/hr at 07/25/21 0855 1 g at 07/25/21 0855   enoxaparin (LOVENOX) injection 40 mg  40 mg Subcutaneous Q24H Carollee Herter, DO   40 mg at 07/24/21 2245   folic acid (FOLVITE) tablet 2 mg  2 mg Oral Daily Alford Highland, MD   2 mg at 07/25/21 0950   ondansetron (ZOFRAN) tablet 4 mg  4 mg Oral Q6H PRN Carollee Herter, DO       Or   ondansetron (  ZOFRAN) injection 4 mg  4 mg Intravenous Q6H PRN Carollee Herter, DO   4 mg at 07/24/21 0957   pantoprazole (PROTONIX) EC tablet 40 mg  40 mg Oral Daily Carollee Herter, DO   40 mg at 07/25/21 0950   propranolol ER (INDERAL LA) 24 hr capsule 60 mg  60 mg Oral Daily Carollee Herter, DO   60 mg at 07/25/21 0677     Discharge Medications: Please see discharge summary for a list of discharge medications.  Relevant Imaging Results:  Relevant Lab Results:   Additional Information SS #: [id number]  Taria Castrillo E Jerrico Covello, LCSW

## 2021-07-25 NOTE — Progress Notes (Signed)
Spoke to son Minerva Areola, he will call me back in about an hour to discuss SNF.  Alfonso Ramus, [place] [phone]

## 2021-07-25 NOTE — Progress Notes (Signed)
Patient ID: [name], female   DOB: [birth year], 87 y.o.   MRN: [id number] [facility] PROGRESS NOTE  [name]:[id number] DOB: Jun 13, 1930 DOA: 07/23/2021 PCP: Marguarite Arbour, MD  HPI/Subjective: Patient feels okay.  Patient communicating better today than yesterday.  Patient very hard of hearing and that also is hard with communication.  No abdominal pain.  No burning on urination.  No chest pain or shortness of breath.  Objective: Vitals:   07/25/21 1253 07/25/21 1254  BP: (!) 171/60 (!) 171/60  Pulse: 60 63  Resp: 18 18  Temp: 98.2 F (36.8 C) 98.2 F (36.8 C)  SpO2: 98% 98%    Intake/Output Summary (Last 24 hours) at 07/25/2021 1411 Last data filed at 07/25/2021 0900 Gross per 24 hour  Intake 100 ml  Output 600 ml  Net -500 ml   Filed Weights   07/23/21 0528  Weight: 56.7 kg    ROS: Review of Systems  Respiratory:  Negative for shortness of breath.   Cardiovascular:  Negative for chest pain.  Gastrointestinal:  Negative for abdominal pain, nausea and vomiting.  Genitourinary:  Negative for dysuria.  Exam: Physical Exam HENT:     Head: Normocephalic.     Mouth/Throat:     Pharynx: No oropharyngeal exudate.  Eyes:     General: Lids are normal.     Conjunctiva/sclera: Conjunctivae normal.  Cardiovascular:     Rate and Rhythm: Normal rate and regular rhythm.     Heart sounds: Normal heart sounds, S1 normal and S2 normal.  Pulmonary:     Breath sounds: No decreased breath sounds, wheezing, rhonchi or rales.  Abdominal:     Palpations: Abdomen is soft.     Tenderness: There is no abdominal tenderness.  Musculoskeletal:     Right lower leg: No swelling.     Left lower leg: No swelling.  Skin:    General: Skin is warm.     Findings: No rash.  Neurological:     Mental Status: She is alert.     Comments: Answers some questions and follow some simple commands.      Scheduled Meds:  amLODipine  5 mg Oral Daily   enoxaparin (LOVENOX)  injection  40 mg Subcutaneous Q24H   folic acid  2 mg Oral Daily   pantoprazole  40 mg Oral Daily   propranolol ER  60 mg Oral Daily   Continuous Infusions:  cefTRIAXone (ROCEPHIN)  IV 1 g (07/25/21 0855)   Brief history.  87 year old female with past medical history of hypertension, chronic venous insufficiency, GERD, rheumatoid arthritis and anemia presenting with confusion and weakness and found to have a UTI.  Assessment/Plan:  Acute metabolic encephalopathy likely secondary to newly started baclofen versus acute cystitis.  Continue to hold baclofen and treat UTI.  Patient's son thinks mental status has improved since coming in.  I will check B12 and RPR. Acute cystitis without hematuria on Rocephin.  Initial urine culture showing multiple species.  I ordered another urine culture yesterday but do not see any results yet. Hyponatremia.  Sodium just a few points lower than normal range at 130 upon coming into the hospital.  134 as of yesterday with IV fluids. Weakness.  Physical therapy recommends rehab.  Son is agreeable to rehab and that we will get him more time to set up 24/7 care in her home. Essential hypertension.  Blood pressure very elevated this morning and Norvasc was added.  Patient already on propranolol ER. GERD  on Protonix Chronic venous insufficiency Rheumatoid arthritis.  Restart Plaquenil. Macrocytic anemia on folic acid already.  Check B12 level.  Looks like does get B12 injections monthly.    Code Status:     Code Status Orders  (From admission, onward)           Start     Ordered   07/23/21 1131  Full code  Continuous        07/23/21 1130           Code Status History     Date Active Date Inactive Code Status Order ID Comments User Context   07/23/2021 1031 07/23/2021 1130 Full Code [id number]  Carollee Herter, DO ED   06/05/2018 1201 06/05/2018 1632 Full Code [id number]  Christena Flake, MD Inpatient   03/27/2018 1839 03/28/2018 1618 Full Code [id number]   Poggi, Excell Seltzer, MD Inpatient      Family Communication: Spoke with son outside the room Disposition Plan: Status is: Inpatient  Dispo: The patient is from: Home              Anticipated d/c is to: Rehab              Patient currently transitional care team looking into rehab beds.  Being treated for acute metabolic encephalopathy and acute cystitis.   Difficult to place patient.  No  Antibiotics: Rocephin  Time spent: 27 minutes  [employer]

## 2021-07-25 NOTE — Progress Notes (Signed)
   07/25/21 0753  Assess: MEWS Score  Temp 98.6 F (37 C)  BP (!) 205/76  Pulse Rate 64  Resp 18  Level of Consciousness Alert  SpO2 97 %  O2 Device Room Air  Assess: MEWS Score  MEWS Temp 0  MEWS Systolic 2  MEWS Pulse 0  MEWS RR 0  MEWS LOC 0  MEWS Score 2  MEWS Score Color Yellow  Assess: if the MEWS score is Yellow or Red  Were vital signs taken at a resting state? Yes  Focused Assessment Change from prior assessment (see assessment flowsheet)  Does the patient meet 2 or more of the SIRS criteria? No  MEWS guidelines implemented *See Row Information* Yes  Treat  MEWS Interventions Administered scheduled meds/treatments  Pain Scale 0-10  Pain Score 0  Escalate  MEWS: Escalate Yellow: discuss with charge nurse/RN and consider discussing with provider and RRT  Notify: Charge Nurse/RN  Date Charge Nurse/RN Notified 07/25/21  Time Charge Nurse/RN Notified 0830  Notify: Provider  Provider Name/Title Dr. Renae Gloss  Date Provider Notified 07/25/21  Time Provider Notified 0830  Notification Type Page  Notification Reason Other (Comment) (Yellow [place] High BP)  Provider response At bedside  Date of Provider Response 07/25/21 (0840)  Time of Provider Response 0840  Document  Patient Outcome Other (Comment) (keep on current unit monitor BP)  Progress note created (see row info) Yes (Gave scheduled BP medication early per MD)  Assess: SIRS CRITERIA  SIRS Temperature  0  SIRS Pulse 0  SIRS Respirations  0  SIRS WBC 0  SIRS Score Sum  0

## 2021-07-26 LAB — BASIC METABOLIC PANEL
Anion gap: 10 (ref 5–15)
BUN: 14 mg/dL (ref 8–23)
CO2: 26 mmol/L (ref 22–32)
Calcium: 9 mg/dL (ref 8.9–10.3)
Chloride: 95 mmol/L — ABNORMAL LOW (ref 98–111)
Creatinine, Ser: 0.73 mg/dL (ref 0.44–1.00)
GFR, Estimated: >60 mL/min (ref >60)
Glucose, Bld: 99 mg/dL (ref 70–99)
Potassium: 4.1 mmol/L (ref 3.5–5.1)
Sodium: 131 mmol/L — ABNORMAL LOW (ref 135–145)

## 2021-07-26 LAB — RPR: RPR Ser Ql: NONREACTIVE

## 2021-07-26 LAB — RESP PANEL BY RT-PCR (FLU A&B, COVID) ARPGX2
Influenza A by PCR: NEGATIVE
Influenza B by PCR: NEGATIVE
SARS Coronavirus 2 by RT PCR: NEGATIVE

## 2021-07-26 LAB — CBC
HCT: 35 % — ABNORMAL LOW (ref 36.0–46.0)
Hemoglobin: 12.6 g/dL (ref 12.0–15.0)
MCH: 37.2 pg — ABNORMAL HIGH (ref 26.0–34.0)
MCHC: 36 g/dL (ref 30.0–36.0)
MCV: 103.2 fL — ABNORMAL HIGH (ref 80.0–100.0)
Platelets: 253 10*3/uL (ref 150–400)
RBC: 3.39 MIL/uL — ABNORMAL LOW (ref 3.87–5.11)
RDW: 11.2 % — ABNORMAL LOW (ref 11.5–15.5)
WBC: 7.3 10*3/uL (ref 4.0–10.5)
nRBC: 0 % (ref 0.0–0.2)

## 2021-07-26 LAB — VITAMIN B12: Vitamin B-12: 1882 pg/mL — ABNORMAL HIGH (ref 180–914)

## 2021-07-26 MED ORDER — BISACODYL 10 MG RE SUPP
10.0000 mg | Freq: Every day | RECTAL | Status: DC
  Administered 2021-07-26: 10 mg via RECTAL
  Filled 2021-07-26: qty 1

## 2021-07-26 NOTE — Progress Notes (Signed)
[facility]  - [place] at [facility]   PATIENT NAME: [name]    MR#:  [id number]  DATE OF BIRTH:  04-15-1930  SUBJECTIVE:  son at bedside. Patient had earlier mild headache resolved with Tylenol. Currently eating her lunch. Denies any complaints. No dysuria. REVIEW OF SYSTEMS:   Review of Systems  Constitutional:  Negative for chills, fever and weight loss.  HENT:  Negative for ear discharge, ear pain and nosebleeds.   Eyes:  Negative for blurred vision, pain and discharge.  Respiratory:  Negative for sputum production, shortness of breath, wheezing and stridor.   Cardiovascular:  Negative for chest pain, palpitations, orthopnea and PND.  Gastrointestinal:  Negative for abdominal pain, diarrhea, nausea and vomiting.  Genitourinary:  Negative for frequency and urgency.  Musculoskeletal:  Negative for back pain and joint pain.  Neurological:  Positive for weakness. Negative for sensory change, speech change and focal weakness.  Psychiatric/Behavioral:  Negative for depression and hallucinations. The patient is not nervous/anxious.   Tolerating Diet:yes Tolerating PT: SNF  DRUG ALLERGIES:   Allergies  Allergen Reactions   Codeine Nausea And Vomiting   Doxycycline Other (See Comments)    Flu-like s/s   Morphine Nausea And Vomiting and Anxiety    Hallucinations and anxiety   Tramadol Nausea And Vomiting and Other (See Comments)    Causes poor balance   Adhesive [Tape] Rash   Amitriptyline Other (See Comments)    Caused nervousness. Patient states she has never taken this.   Ciprofloxacin Diarrhea   Clarithromycin Nausea Only    Flu like symptoms   Erythromycin Rash   Itraconazole Other (See Comments)    Elevated liver enzymes   Nsaids Nausea Only    Unknown   Penicillins Rash    Hives also. Doctor told her to never ever take pcn unless it is her only option   Zoloft [Sertraline Hcl] Other (See Comments)    Patient unaware of any reaction. Probably  upset stomach    VITALS:  Blood pressure (!) 162/64, pulse 69, temperature 98.9 F (37.2 C), resp. rate 18, height 5' (1.524 m), weight 56.7 kg, SpO2 99 %.  PHYSICAL EXAMINATION:   Physical Exam  GENERAL:  87 y.o.-year-old patient lying in the bed with no acute distress.  LUNGS: Normal breath sounds bilaterally, no wheezing, rales, rhonchi. No use of accessory muscles of respiration.  CARDIOVASCULAR: S1, S2 normal. No murmurs, rubs, or gallops.  ABDOMEN: Soft, nontender, nondistended. Bowel sounds present. No organomegaly or mass.  EXTREMITIES: No cyanosis, clubbing or edema b/l.    NEUROLOGIC: non focal PSYCHIATRIC:  patient is alert and awake SKIN: No obvious rash, lesion, or ulcer.   LABORATORY PANEL:  CBC Recent Labs  Lab 07/26/21 0448  WBC 7.3  HGB 12.6  HCT 35.0*  PLT 253    Chemistries  Recent Labs  Lab 07/24/21 0514 07/26/21 0448  NA 134* 131*  K 3.9 4.1  CL 100 95*  CO2 26 26  GLUCOSE 93 99  BUN 10 14  CREATININE 0.69 0.73  CALCIUM 8.8* 9.0  MG 2.0  --   AST 36  --   ALT 20  --   ALKPHOS 56  --   BILITOT 1.1  --    Cardiac Enzymes No results for input(s): TROPONINI in the last 168 hours. RADIOLOGY:  No results found. ASSESSMENT AND PLAN:   87 year old female with past medical history of hypertension, chronic venous insufficiency, GERD, rheumatoid arthritis and anemia presenting with confusion  and weakness and found to have a UTI.  Acute metabolic encephalopathy likely due to acute cystitis versus new medication baclofen -- symptoms resolved -- hold baclofen -- patient got three days of IV Rocephin. She remains asymptomatic. -- urine culture multiple species  Weakness -- PT recommends rehab. Son is in agreement with rehab.  Hypertension -- continue current home meds propranolol. -- tolerating Norvasc well  chronic venous insufficiency  history of rheumatoid arthritis. Patient on Plaquenil  Genella Rife continue  Protonix   Procedures:none Family communication : son Minerva Areola at bedside Consults : none CODE STATUS: full DVT Prophylaxis : enoxaparin Level of care: Med-Surg Status is: Inpatient  Remains inpatient appropriate because: patient has to have three night stay prior to discharge into rehab.  Dispo: The patient is from: Home              Anticipated d/c is to: SNF              Patient currently is medically stable to d/c.   Difficult to place patient No        TOTAL TIME TAKING CARE OF THIS PATIENT: 25 minutes.  >50% time spent on counselling and coordination of care  Note: This dictation was prepared with Dragon dictation along with smaller phrase technology. Any transcriptional errors that result from this process are unintentional.  Enedina Finner M.D    [facility]   CC: Primary care physician; Marguarite Arbour, MD Patient ID: [name], female   DOB: May 29, 1930, 87 y.o.   MRN: [id number]

## 2021-07-26 NOTE — TOC Progression Note (Signed)
Transition of Care [facility]) - Progression Note    Patient Details  Name: [name] MRN: [id number] Date of Birth: [birth year]  [facility] CM/SW Contact  Chapman Fitch, RN Phone Number: 07/26/2021, 2:29 PM  Clinical Narrative:    Reviewed bed offers with so Minerva Areola.  He has accepted offer at [facility].  Accepted in the Hub and notified Kristy  at [facility] MD to order repeat covid test    Expected Discharge Plan: Skilled Nursing Facility Barriers to Discharge: Continued Medical Work up  Expected Discharge Plan and Services Expected Discharge Plan: Skilled Nursing Facility       Living arrangements for the past 2 months: Single Family Home                                       Social Determinants of Health (SDOH) Interventions    Readmission Risk Interventions Readmission Risk Prevention Plan 07/25/2021  Transportation Screening Complete  PCP or Specialist Appt within 5-7 Days Complete  Home Care Screening Complete  Medication Review (RN CM) Complete  Some recent data might be hidden

## 2021-07-27 ENCOUNTER — Encounter: Payer: Self-pay | Admitting: Internal Medicine

## 2021-07-27 LAB — URINE CULTURE: Culture: <10000 — AB

## 2021-07-27 MED ORDER — AMLODIPINE BESYLATE 5 MG PO TABS: 5.0000 mg | ORAL_TABLET | Freq: Every day | ORAL | 0 refills | Status: DC

## 2021-07-27 MED ORDER — ALPRAZOLAM 0.25 MG PO TABS: 0.2500 mg | ORAL_TABLET | Freq: Two times a day (BID) | ORAL | 0 refills | Status: AC | PRN

## 2021-07-27 MED ORDER — HALOPERIDOL LACTATE 5 MG/ML IJ SOLN
1.0000 mg | Freq: Four times a day (QID) | INTRAMUSCULAR | Status: DC | PRN
  Administered 2021-07-27: 2 mg via INTRAMUSCULAR
  Filled 2021-07-27: qty 1

## 2021-07-27 NOTE — Discharge Summary (Signed)
[facility] - [place] at [facility]   PATIENT NAME: [name]    MR#:  [id number]  DATE OF BIRTH:  1930/03/21  DATE OF ADMISSION:  07/23/2021 ADMITTING PHYSICIAN: Alford Highland, MD  DATE OF DISCHARGE: 07/27/2021  PRIMARY CARE PHYSICIAN: Marguarite Arbour, MD    ADMISSION DIAGNOSIS:  Lower urinary tract infectious disease [N39.0] Acute cystitis without hematuria [N30.00] Altered mental status, unspecified altered mental status type [R41.82] Acute metabolic encephalopathy [G93.41]  DISCHARGE DIAGNOSIS:  acute metabolic encephalopathy resolved acute cystitis completed treatment  SECONDARY DIAGNOSIS:   Past Medical History:  Diagnosis Date   Anemia    GERD (gastroesophageal reflux disease)    Hypertension    Neuropathy    Peripheral vascular disease (HCC)    possible neuropathies in lower extremeties   PONV (postoperative nausea and vomiting)    happens sometimes but better with pre med of zofran   Presence of permanent cardiac pacemaker    Syncope     HOSPITAL COURSE:   87 year old female with past medical history of hypertension, chronic venous insufficiency, GERD, rheumatoid arthritis and anemia presenting with confusion and weakness and found to have a UTI.   Acute metabolic encephalopathy likely due to acute cystitis versus new medication baclofen -- symptoms resolved -- hold baclofen -- patient got three days of IV Rocephin. She remains asymptomatic. -- urine culture multiple species   Weakness -- PT recommends rehab. Son is in agreement with rehab and chose [facility]   Hypertension -- continue current home meds propranolol. -- tolerating Norvasc well   chronic venous insufficiency   history of rheumatoid arthritis. Patient on Plaquenil and Methotrexate (per PCP clinical notes--med list)   Genella Rife continue Protonix     Procedures:none Family communication : son Minerva Areola on 8/15 Consults : none CODE STATUS: full DVT Prophylaxis :  enoxaparin Level of care: Med-Surg Status is: Inpatient     Dispo: The patient is from: Home              Anticipated d/c is to: SNF              Patient currently is medically stable to d/c.              Difficult to place patient No    CONSULTS OBTAINED:    DRUG ALLERGIES:   Allergies  Allergen Reactions   Codeine Nausea And Vomiting   Doxycycline Other (See Comments)    Flu-like s/s   Morphine Nausea And Vomiting and Anxiety    Hallucinations and anxiety   Tramadol Nausea And Vomiting and Other (See Comments)    Causes poor balance   Adhesive [Tape] Rash   Amitriptyline Other (See Comments)    Caused nervousness. Patient states she has never taken this.   Ciprofloxacin Diarrhea   Clarithromycin Nausea Only    Flu like symptoms   Erythromycin Rash   Itraconazole Other (See Comments)    Elevated liver enzymes   Nsaids Nausea Only    Unknown   Penicillins Rash    Hives also. Doctor told her to never ever take pcn unless it is her only option   Zoloft [Sertraline Hcl] Other (See Comments)    Patient unaware of any reaction. Probably upset stomach    DISCHARGE MEDICATIONS:   Allergies as of 07/27/2021       Reactions   Codeine Nausea And Vomiting   Doxycycline Other (See Comments)   Flu-like s/s   Morphine Nausea And Vomiting, Anxiety   Hallucinations  and anxiety   Tramadol Nausea And Vomiting, Other (See Comments)   Causes poor balance   Adhesive [tape] Rash   Amitriptyline Other (See Comments)   Caused nervousness. Patient states she has never taken this.   Ciprofloxacin Diarrhea   Clarithromycin Nausea Only   Flu like symptoms   Erythromycin Rash   Itraconazole Other (See Comments)   Elevated liver enzymes   Nsaids Nausea Only   Unknown   Penicillins Rash   Hives also. Doctor told her to never ever take pcn unless it is her only option   Zoloft [sertraline Hcl] Other (See Comments)   Patient unaware of any reaction. Probably upset stomach         Medication List     STOP taking these medications    baclofen 10 MG tablet Commonly known as: LIORESAL   diclofenac sodium 1 % Gel Commonly known as: VOLTAREN   nebivolol 5 MG tablet Commonly known as: BYSTOLIC       TAKE these medications    acetaminophen 650 MG CR tablet Commonly known as: TYLENOL Take 1,300 mg by mouth 2 (two) times daily.   ALPRAZolam 0.25 MG tablet Commonly known as: XANAX Take 1 tablet (0.25 mg total) by mouth 2 (two) times daily as needed for anxiety.   amLODipine 5 MG tablet Commonly known as: NORVASC Take 1 tablet (5 mg total) by mouth daily.   Calcium-D 600-400 MG-UNIT Tabs Take 1 tablet by mouth 2 (two) times daily.   CALMME PO Take 5 mLs by mouth at bedtime. Magnesium 1 tsp at night (dissolved in 4 oz. Water)   cyanocobalamin 1000 MCG/ML injection Commonly known as: (VITAMIN B-12) Inject 1,000 mcg into the muscle every 30 (thirty) days.   estradiol 0.1 MG/GM vaginal cream Commonly known as: ESTRACE Place 1 Applicatorful vaginally at bedtime as needed (for vaginal discomfort).   FIBER-CAPS PO Take 1 capsule by mouth at bedtime.   Finacea 15 % gel Generic drug: Azelaic Acid Apply 1 application topically every other day. (alternates between Retin-A)   folic acid 1 MG tablet Commonly known as: FOLVITE Take 2 mg by mouth daily.   hydroxychloroquine 200 MG tablet Commonly known as: PLAQUENIL Take 200 mg by mouth daily.   megestrol 40 MG/ML suspension Commonly known as: MEGACE Take 400 mg by mouth daily.   methotrexate 2.5 MG tablet Take 20 mg by mouth once a week.   multivitamin tablet Take 1 tablet by mouth daily.   omeprazole 40 MG capsule Commonly known as: PRILOSEC Take 40 mg by mouth daily before breakfast.   ondansetron 4 MG tablet Commonly known as: ZOFRAN Take 4 mg by mouth every 8 (eight) hours as needed for nausea or vomiting.   PHILLIPS COLON HEALTH PO Take 1 tablet by mouth daily.   propranolol ER 60  MG 24 hr capsule Commonly known as: INDERAL LA Take 60 mg by mouth daily.   tretinoin 0.05 % cream Commonly known as: RETIN-A Apply 1 application topically every other day. (alternate between finacea)   vitamin C 1000 MG tablet Take 1,000 mg by mouth daily.   Vitamin D-3 125 MCG (5000 UT) Tabs Take 5,000 Units by mouth daily.        If you experience worsening of your admission symptoms, develop shortness of breath, life threatening emergency, suicidal or homicidal thoughts you must seek medical attention immediately by calling 911 or calling your MD immediately  if symptoms less severe.  You Must read complete instructions/literature along with  all the possible adverse reactions/side effects for all the Medicines you take and that have been prescribed to you. Take any new Medicines after you have completely understood and accept all the possible adverse reactions/side effects.   Please note  You were cared for by a hospitalist during your hospital stay. If you have any questions about your discharge medications or the care you received while you were in the hospital after you are discharged, you can call the unit and asked to speak with the hospitalist on call if the hospitalist that took care of you is not available. Once you are discharged, your primary care physician will handle any further medical issues. Please note that NO REFILLS for any discharge medications will be authorized once you are discharged, as it is imperative that you return to your primary care physician (or establish a relationship with a primary care physician if you do not have one) for your aftercare needs so that they can reassess your need for medications and monitor your lab values. Today   SUBJECTIVE   No new complaints Wants to get out and sit in the chair  VITAL SIGNS:  Blood pressure (!) 118/49, pulse 65, temperature 97.9 F (36.6 C), temperature source Oral, resp. rate 20, height 5' (1.524 m),  weight 55.2 kg, SpO2 97 %.  I/O:   Intake/Output Summary (Last 24 hours) at 07/27/2021 0847 Last data filed at 07/26/2021 2152 Gross per 24 hour  Intake 0 ml  Output 0 ml  Net 0 ml    PHYSICAL EXAMINATION:  GENERAL:  87 y.o.-year-old patient lying in the bed with no acute distress.  LUNGS: Normal breath sounds bilaterally, no wheezing, rales, rhonchi. No use of accessory muscles of respiration.  CARDIOVASCULAR: S1, S2 normal. No murmurs, rubs, or gallops.  ABDOMEN: Soft, nontender, nondistended. Bowel sounds present. No organomegaly or mass.  EXTREMITIES: No cyanosis, clubbing or edema b/l.    NEUROLOGIC: non focal PSYCHIATRIC:  patient is alert and awake SKIN: No obvious rash, lesion, or ulcer.    DATA REVIEW:   CBC  Recent Labs  Lab 07/26/21 0448  WBC 7.3  HGB 12.6  HCT 35.0*  PLT 253    Chemistries  Recent Labs  Lab 07/24/21 0514 07/26/21 0448  NA 134* 131*  K 3.9 4.1  CL 100 95*  CO2 26 26  GLUCOSE 93 99  BUN 10 14  CREATININE 0.69 0.73  CALCIUM 8.8* 9.0  MG 2.0  --   AST 36  --   ALT 20  --   ALKPHOS 56  --   BILITOT 1.1  --     Microbiology Results   Recent Results (from the past 240 hour(s))  Urine Culture     Status: Abnormal   Collection Time: 07/23/21  5:27 AM   Specimen: In/Out Cath Urine  Result Value Ref Range Status   Specimen Description   Final    IN/OUT CATH URINE Performed at [facility], [address], [place], [place] [address]    Special Requests   Final    NONE Performed at [facility], [address] [place], [place] [address]    Culture MULTIPLE SPECIES PRESENT, SUGGEST RECOLLECTION (A)  Final   Report Status 07/24/2021 FINAL  Final  Culture, blood (routine x 2)     Status: None (Preliminary result)   Collection Time: 07/23/21  5:27 AM   Specimen: BLOOD LEFT FOREARM  Result Value Ref Range Status   Specimen Description BLOOD LEFT  FOREARM  Final   Special Requests   Final    BOTTLES DRAWN AEROBIC  AND ANAEROBIC Blood Culture adequate volume   Culture   Final    NO GROWTH 4 DAYS Performed at [facility], [address] [place], [place] [address]    Report Status PENDING  Incomplete  Culture, blood (routine x 2)     Status: None (Preliminary result)   Collection Time: 07/23/21  5:32 AM   Specimen: BLOOD RIGHT FOREARM  Result Value Ref Range Status   Specimen Description BLOOD RIGHT FOREARM  Final   Special Requests   Final    BOTTLES DRAWN AEROBIC AND ANAEROBIC Blood Culture adequate volume   Culture   Final    NO GROWTH 4 DAYS Performed at [facility] [address] [place], [place] [address]    Report Status PENDING  Incomplete  Resp Panel by RT-PCR (Flu A&B, Covid) Urine, Catheterized     Status: None   Collection Time: 07/23/21  9:46 PM   Specimen: Urine, Catheterized; Nasopharyngeal(NP) swabs in vial transport medium  Result Value Ref Range Status   SARS Coronavirus 2 by RT PCR NEGATIVE NEGATIVE Final    Comment: (NOTE) SARS-CoV-2 target nucleic acids are NOT DETECTED.  The SARS-CoV-2 RNA is generally detectable in upper respiratory specimens during the acute phase of infection. The lowest concentration of SARS-CoV-2 viral copies this assay can detect is 138 copies/mL. A negative result does not preclude SARS-Cov-2 infection and should not be used as the sole basis for treatment or other patient management decisions. A negative result may occur with  improper specimen collection/handling, submission of specimen other than nasopharyngeal swab, presence of viral mutation(s) within the areas targeted by this assay, and inadequate number of viral copies(<138 copies/mL). A negative result must be combined with clinical observations, patient history, and epidemiological information. The expected result is Negative.  Fact Sheet for Patients:  BloggerCourse.com  Fact Sheet for Healthcare Providers:   SeriousBroker.it  This test is no t yet approved or cleared by the [place] FDA and  has been authorized for detection and/or diagnosis of SARS-CoV-2 by FDA under an Emergency Use Authorization (EUA). This EUA will remain  in effect (meaning this test can be used) for the duration of the COVID-19 declaration under Section 564(b)(1) of the Act, 21 U.S.C.section 360bbb-3(b)(1), unless the authorization is terminated  or revoked sooner.       Influenza A by PCR NEGATIVE NEGATIVE Final   Influenza B by PCR NEGATIVE NEGATIVE Final    Comment: (NOTE) The Xpert Xpress SARS-CoV-2/FLU/RSV plus assay is intended as an aid in the diagnosis of influenza from Nasopharyngeal swab specimens and should not be used as a sole basis for treatment. Nasal washings and aspirates are unacceptable for Xpert Xpress SARS-CoV-2/FLU/RSV testing.  Fact Sheet for Patients: BloggerCourse.com  Fact Sheet for Healthcare Providers: SeriousBroker.it  This test is not yet approved or cleared by the [place] FDA and has been authorized for detection and/or diagnosis of SARS-CoV-2 by FDA under an Emergency Use Authorization (EUA). This EUA will remain in effect (meaning this test can be used) for the duration of the COVID-19 declaration under Section 564(b)(1) of the Act, 21 U.S.C. section 360bbb-3(b)(1), unless the authorization is terminated or revoked.  Performed at [facility], [address] [place], [place] [address]   Urine Culture     Status: Abnormal   Collection Time: 07/25/21  7:00 PM   Specimen: Urine, Random  Result  Value Ref Range Status   Specimen Description   Final    URINE, RANDOM Performed at [facility], [address] [place], [place] [address]    Special Requests   Final    NONE Performed at [facility], [address] [place], [place] [address]    Culture (A)   Final    <10,000 COLONIES/mL INSIGNIFICANT GROWTH Performed at [facility], [address] [place], [place] [address]    Report Status 07/27/2021 FINAL  Final  Resp Panel by RT-PCR (Flu A&B, Covid) Nasopharyngeal Swab     Status: None   Collection Time: 07/26/21  2:32 PM   Specimen: Nasopharyngeal Swab; Nasopharyngeal(NP) swabs in vial transport medium  Result Value Ref Range Status   SARS Coronavirus 2 by RT PCR NEGATIVE NEGATIVE Final    Comment: (NOTE) SARS-CoV-2 target nucleic acids are NOT DETECTED.  The SARS-CoV-2 RNA is generally detectable in upper respiratory specimens during the acute phase of infection. The lowest concentration of SARS-CoV-2 viral copies this assay can detect is 138 copies/mL. A negative result does not preclude SARS-Cov-2 infection and should not be used as the sole basis for treatment or other patient management decisions. A negative result may occur with  improper specimen collection/handling, submission of specimen other than nasopharyngeal swab, presence of viral mutation(s) within the areas targeted by this assay, and inadequate number of viral copies(<138 copies/mL). A negative result must be combined with clinical observations, patient history, and epidemiological information. The expected result is Negative.  Fact Sheet for Patients:  BloggerCourse.com  Fact Sheet for Healthcare Providers:  SeriousBroker.it  This test is no t yet approved or cleared by the [place] FDA and  has been authorized for detection and/or diagnosis of SARS-CoV-2 by FDA under an Emergency Use Authorization (EUA). This EUA will remain  in effect (meaning this test can be used) for the duration of the COVID-19 declaration under Section 564(b)(1) of the Act, 21 U.S.C.section 360bbb-3(b)(1), unless the authorization is terminated  or revoked sooner.       Influenza A by PCR NEGATIVE NEGATIVE Final    Influenza B by PCR NEGATIVE NEGATIVE Final    Comment: (NOTE) The Xpert Xpress SARS-CoV-2/FLU/RSV plus assay is intended as an aid in the diagnosis of influenza from Nasopharyngeal swab specimens and should not be used as a sole basis for treatment. Nasal washings and aspirates are unacceptable for Xpert Xpress SARS-CoV-2/FLU/RSV testing.  Fact Sheet for Patients: BloggerCourse.com  Fact Sheet for Healthcare Providers: SeriousBroker.it  This test is not yet approved or cleared by the [place] FDA and has been authorized for detection and/or diagnosis of SARS-CoV-2 by FDA under an Emergency Use Authorization (EUA). This EUA will remain in effect (meaning this test can be used) for the duration of the COVID-19 declaration under Section 564(b)(1) of the Act, 21 U.S.C. section 360bbb-3(b)(1), unless the authorization is terminated or revoked.  Performed at [facility], [address], [place], [place] [address]     RADIOLOGY:  No results found.   CODE STATUS:     Code Status Orders  (From admission, onward)           Start     Ordered   07/23/21 1131  Full code  Continuous        07/23/21 1130           Code Status History     Date Active Date Inactive Code Status Order ID Comments User Context  07/23/2021 1031 07/23/2021 1130 Full Code [id number]  Carollee Herter, DO ED   06/05/2018 1201 06/05/2018 1632 Full Code [id number]  Christena Flake, MD Inpatient   03/27/2018 1839 03/28/2018 1618 Full Code [id number]  Poggi, Excell Seltzer, MD Inpatient        TOTAL TIME TAKING CARE OF THIS PATIENT: 40 minutes.    Enedina Finner M.D  [facility]    CC: Primary care physician; Marguarite Arbour, MD

## 2021-07-27 NOTE — Progress Notes (Signed)
Report called to Charlie at [facility]. No questions/concerns at this time. Patient is aware of discharge plan, no questions or concerns at this time. EMS has been called for transport, IV removed, VSS.

## 2021-07-27 NOTE — TOC Transition Note (Signed)
Transition of Care [facility]) - CM/SW Discharge Note   Patient Details  Name: [name] MRN: [id number] Date of Birth: February 15, 1930  Transition of Care [facility]) CM/SW Contact:  Chapman Fitch, RN Phone Number: 07/27/2021, 10:31 AM   Clinical Narrative:      Patient will DC [facility] Anticipated DC date: 07/27/21/ Family notified:Son [name] Transport by:EMS  Per MD patient ready for DC to . RN, patient's family, and facility notified of DC. Discharge Summary sent to facility. RN given number for report. DC packet on chart. Ambulance transport requested for patient.  TOC signing off.  Bevelyn Ngo [facility] [phone]    Barriers to Discharge: Continued Medical Work up   Patient Goals and CMS Choice Patient states their goals for this hospitalization and ongoing recovery are:: SNF CMS Medicare.gov Compare Post Acute Care list provided to:: Patient Represenative (must comment) Choice offered to / list presented to : Adult Children  Discharge Placement                       Discharge Plan and Services                                     Social Determinants of Health (SDOH) Interventions     Readmission Risk Interventions Readmission Risk Prevention Plan 07/25/2021  Transportation Screening Complete  PCP or Specialist Appt within 5-7 Days Complete  Home Care Screening Complete  Medication Review (RN CM) Complete  Some recent data might be hidden

## 2021-07-27 NOTE — Care Management Important Message (Signed)
Important Message  Patient Details  Name: [name] MRN: [id number] Date of Birth: 08-Jun-1930   Medicare Important Message Given:  Yes     Johnell Comings 07/27/2021, 11:09 AM

## 2021-07-28 LAB — CULTURE, BLOOD (ROUTINE X 2)
Culture: NO GROWTH
Culture: NO GROWTH
Special Requests: ADEQUATE
Special Requests: ADEQUATE

## 2021-09-23 ENCOUNTER — Other Ambulatory Visit: Payer: Self-pay | Admitting: Internal Medicine

## 2021-09-23 DIAGNOSIS — I10 Essential (primary) hypertension: Secondary | ICD-10-CM

## 2021-09-23 DIAGNOSIS — M25551 Pain in right hip: Secondary | ICD-10-CM

## 2021-10-29 ENCOUNTER — Ambulatory Visit
Admission: RE | Admit: 2021-10-29 | Discharge: 2021-10-29 | Disposition: A | Discharge status: Home / Self Care | Payer: Medicare Other | Source: Ambulatory Visit | Attending: Internal Medicine | Admitting: Internal Medicine

## 2021-10-29 ENCOUNTER — Other Ambulatory Visit: Payer: Self-pay

## 2021-10-29 DIAGNOSIS — I10 Essential (primary) hypertension: Secondary | ICD-10-CM | POA: Insufficient documentation

## 2021-10-29 DIAGNOSIS — M25551 Pain in right hip: Secondary | ICD-10-CM | POA: Diagnosis present

## 2021-12-12 DIAGNOSIS — Z950 Presence of cardiac pacemaker: Secondary | ICD-10-CM | POA: Diagnosis present

## 2021-12-12 DIAGNOSIS — H903 Sensorineural hearing loss, bilateral: Secondary | ICD-10-CM | POA: Insufficient documentation

## 2022-02-17 ENCOUNTER — Encounter: Payer: Medicare Other | Attending: Internal Medicine | Admitting: Internal Medicine

## 2022-02-17 ENCOUNTER — Other Ambulatory Visit: Payer: Self-pay

## 2022-02-17 DIAGNOSIS — S81812A Laceration without foreign body, left lower leg, initial encounter: Secondary | ICD-10-CM | POA: Insufficient documentation

## 2022-02-17 DIAGNOSIS — I872 Venous insufficiency (chronic) (peripheral): Secondary | ICD-10-CM | POA: Insufficient documentation

## 2022-02-17 DIAGNOSIS — L97812 Non-pressure chronic ulcer of other part of right lower leg with fat layer exposed: Secondary | ICD-10-CM | POA: Insufficient documentation

## 2022-02-17 DIAGNOSIS — X58XXXA Exposure to other specified factors, initial encounter: Secondary | ICD-10-CM | POA: Diagnosis not present

## 2022-02-17 DIAGNOSIS — L97822 Non-pressure chronic ulcer of other part of left lower leg with fat layer exposed: Secondary | ICD-10-CM | POA: Insufficient documentation

## 2022-02-17 DIAGNOSIS — I10 Essential (primary) hypertension: Secondary | ICD-10-CM | POA: Diagnosis not present

## 2022-02-17 DIAGNOSIS — M069 Rheumatoid arthritis, unspecified: Secondary | ICD-10-CM | POA: Insufficient documentation

## 2022-02-17 DIAGNOSIS — Z950 Presence of cardiac pacemaker: Secondary | ICD-10-CM | POA: Insufficient documentation

## 2022-02-17 DIAGNOSIS — S81811A Laceration without foreign body, right lower leg, initial encounter: Secondary | ICD-10-CM | POA: Diagnosis not present

## 2022-02-17 DIAGNOSIS — J449 Chronic obstructive pulmonary disease, unspecified: Secondary | ICD-10-CM | POA: Insufficient documentation

## 2022-02-17 DIAGNOSIS — M419 Scoliosis, unspecified: Secondary | ICD-10-CM | POA: Insufficient documentation

## 2022-02-21 NOTE — Progress Notes (Signed)
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 usual options here. I could consider either another round of an amniotic advanced treatment product example epifix or perhaps regranex. Understandably the patient is a bit frustrated. We did send her to plastic surgery for a consult. Other than prescribing her a brace to immobilize the elbow they did not think she was a candidate for any further surgery. Notable that the patient is not using the brace today 3/18-Patient returns for attention to the left elbow area which apparently looked red at the home health visit. Patient's elbow looks the same if not better compared with last visit. The area of ulceration remains the same, the base appears healthy. We are continuing to use endoform she has been encouraged to use the brace to keep the elbow straight 3/25; the patient has an appointment at the [facility] wound care center on 4/2. I had actually put her out indefinitely however she seems to want to come back here every week. This week she complains of increased pain and malodor. Dimensions of the elbow wound are larger. We have been using endoform 4/8; the patient went to [facility] where they apparently gave her meta honey and  some border foam with a Tubigrip. She has been using this for a week. She says she was very impressed with them there. They did not offer her any surgical consultation. She seems to be coming back here for a second opinion on this, she does not wish to drive to [facility] every week 4/22; still using Medihoney foam border and a Tubigrip. Actually do not think she has anything on the arm at all specifically she is not using her brace 5/6; she has been using medihoney without a lot of improvement. Undermining maximum at 9:00 at 0.5 cm may be somewhat better. She is still complaining of discomfort. She uses her elbow brace at night but is not using anything on the arm during the day, she finds it too restrictive 5/13; we switch the patient to endoform AG last week. She is complaining of more pain and worried about some circumferential erythema around  the wound. I had planned to consider epifix in this wound however the patient came in with a request to see a plastic surgeon in [place] by the name of Dr. Harl Bowie who cared for a friend of hers. Noteworthy that I have already sent her to one plastic surgeon and she went for another second opinion at [facility] and they apparently did not send her to a plastic surgeon nevertheless the patient is fairly convinced that she might benefit from a skin graft which I am doubtful. She also has rheumatoid arthritis and is on methotrexate. This was originally a surgical wound for a bursectomy a year or 2 ago Readmission: 06/28/19 on evaluation today patient presents today for reevaluation but this is due to a new issue her elbow has completely healed and looks excellent. Her right anterior lower leg has a skin tear which was sustained from her dog who jumped up on her and inadvertently scratch the area causing the skin tear. This happened yesterday. She is having some discomfort but fortunately nothing too significant which is good news. No fevers, chills, nausea, or  vomiting noted at this time. The skin fortunately was knocked one completely off and we are gonna see about we approximate in the skin as best we can in using Steri-Strips to hold this in place obviously if we can get some of this to reattach that would be beneficial 07/05/2019 on evaluation today patient actually appears to be doing okay although unfortunately the skin flap that we were attempting to [place] down last week did not take. She is developing a lot of fluid underneath the wound area unfortunately which again is not ideal. I think that this necrotic tissue needs to be removed and again it actually just wiped off during the evaluation today as I was attempting to clean the wound there did not appear to be any significant issues underlying which is good although there was some purulent drainage I did want to go ahead and see about obtaining a culture from today in order to ensure that the Z-Pak that I placed her on earlier in the week was appropriate for treating what ever infection may be causing the issue currently. This was a deep wound culture obtained today. 07/12/2019 on evaluation today patient actually appears to be doing quite well with regard to her right lower extremity ulcer all things considering. There is still little bit of hematoma not it that is noted in the central portion of the wound along with some necrotic tissue but she is still having quite a bit of discomfort. For that reason I did not perform sharp debridement today although this wound does need some debridement of one type or another. I think we may attempt Iodoflex to see if this can be of benefit. Fortunately there is no signs of active infection at this point. 07/18/19 upon evaluation today patient appears to be doing better with regard to her ulcer on her right lower extremity. She's been tolerating the dressing changes without complication. The good news is she seems to be making excellent progress. Overall  I'm pleased with there being no signs of infection. With that being said she does tell me that she's been having some discomfort with the wrap she's unsure of exactly what about the rafters causing her trouble. 07/25/2019 on evaluation today patient appears to be doing a little better in regard to her lower extremity ulcer. Unfortunately the alginate seems to be getting really stuck to the wound bed and surrounding  periwound. Fortunately there is no signs of active infection at this time. There does not appear to be any evidence of infection currently. 08/01/2019 on evaluation today patient actually appears to be doing much better with regard to her right lower extremity ulcer. She has been tolerating the dressing changes without complication. Fortunately her wound does not show any signs of infection and seems to be making good progress. No fevers, chills, nausea, vomiting, or diarrhea. 08/08/2019 on evaluation today patient actually appears to be doing better with regard to her leg ulcer. She has been tolerating the dressing changes without complication. With that being said I think we may want to switch the dressing up today just based on the appearance of the wound bed in general. Fortunately there is no evidence of infection currently. Her pain seems to be much better. Her son is present with her [name], [name] ([id number]) today. 08/15/2019 on evaluation today patient appears to be doing very well with regard to her right lower extremity ulcer. She still complains about feeling like the compression stocking/Tubigrip is too tight for her. Nonetheless I still think this is beneficial and is helping the wound to heal more effectively. Overall I am extremely pleased at this time with what I am seeing. 08/22/2019 on evaluation today patient actually appears to be doing quite well with regard to her right lower extremity. She has been tolerating the dressing changes without complication. Fortunately  there is no signs of active infection at this time. She still complains about the Tubigrip but nonetheless I think this is something that is of utmost importance for her to continue to wear if she is going to see this area heal appropriately. 08/29/2019 on evaluation today patient actually appears to be doing well visually in regard to her wound. Unfortunately in regard to overall pain she seems to be having more pain today which I am somewhat concerned about. There does not appear to be any signs of active infection that I can tell but again with increased pain that is definitely a concern here today. I do think it may be time to switch up the dressing as well to something that will be a little bit more effective hopefully and new tissue growth she has done well with collagen in the past. 09/05/2019 on evaluation today patient actually appears to be doing well with regard to her leg ulcer. She has been tolerating the dressing changes without complication. Fortunately there is no signs of active infection. We did obtain a wound culture last week due to the fact that she still is very touchy as far as the surface of the wound is concerned. Unfortunately I am not sure what happened to that culture as there is no record in the computer system. Nonetheless we need to follow-up on this and try to see if we can identify what exactly happened with this specimen as we have not gotten a result back either obviously. 09/12/2019 on evaluation today patient actually appears to be showing signs of good improvement upon evaluation today. She states her pain is not nearly as bad as what it was which is also good news. She did have a wound culture which was reviewed today that showed no growth of bacteria which is good news. This coupled with the fact that her wound appears to be healing better, measuring smaller, and overall even the slough buildup is minimal compared to what we have seen previously, I feel like she is  actually showing signs of excellent  improvement today. 09/19/2019 on evaluation today patient appears to be doing well with regard to her wounds on evaluation today. She is showing signs of continued improvement week by week and everything is measuring significantly smaller which is great news. Overall very pleased with the progress that she has made. 09/26/2019 on evaluation today patient appears to be doing fairly well with regard to her right lower extremity ulcer. She does continue to have some drainage and again the smaller of the 2 wounds is not making as much progress as I like to see. We have been doing the silver collagen for some time I think we may want to switch to [facility] she has a little bit of hyper granular tissue this may help to keep things from becoming too dramatic in that regard. Fortunately there is no signs of infection at this time. 10/03/2019 on evaluation today patient appears to be doing fairly well with regard to her lower extremity ulcers at this time. She has been tolerating the dressing changes without complication. Fortunately the wound is measuring smaller which is great news she continues to make great progress. 10/17/2019 on evaluation today patient appears to be doing well with regard to her wounds of her right lower extremity. She states she has been having some increased pain apparently we gave her a little bit smaller size Tubigrip last time she was here this may have been what was causing some issues. She is out of the Tubigrip that we had ordered for her and therefore did not have any of the smaller size. Fortunately there is no evidence of active infection at this time. No fever chills noted 10/24/2019 upon evaluation today patient's wounds actually appear to be showing signs of good improvement which is excellent news. She has been tolerating the dressing changes without complication and overall I am rather pleased at this point with how things seem to  be progressing. The patient likewise is very happy. She still was not happy with having to use the Tubigrip she states that bunches up in her shoe. 10/31/2019 on evaluation today patient actually appears to be doing quite well with regard to her leg ulcer. She has been tolerating the dressing changes without complication. Fortunately there is no signs of active infection at this time. Overall I am very pleased with the progress that she is made. She does wonder if we could switch to the bordered adhesive Hydrofera Blue I think that something we can definitely look into for her. 11/15/2019 patient on evaluation today appears to be making excellent progress. Her wound is significantly smaller and the wound bed appears to be dramatically improved compared to even the last visit. Fortunately she is having really little to no pain at this time. No fevers, chills, nausea, vomiting, or diarrhea. 11/25/2019 on evaluation today patient appears to be doing well with regard to her lower extremity ulcer. She has been tolerating dressing changes without complication. Fortunately there is no signs of active infection at this time. No fevers, chills, nausea, vomiting, or diarrhea. 12/02/2019 on evaluation today patient unfortunately is not doing quite as well as what we would have liked to have seen based on last week's evaluation. Fortunately there is no signs of active infection at this time. With that being said she is continuing to experience some issues with hyper granulation she was doing much better with the [facility] prior to putting the contact layer in between her skin and the [facility] I think that we need to  go back to that. She just can have to be very careful with removing the dressing in order to ensure it does not pull skin and get stuck. 12/16/2019 on evaluation today patient appears to be doing better her wound is measuring significantly smaller at this time compared to where things were  previous. Fortunately there is no evidence of active infection at this point which is also good news. No fever chills noted. 12/23/2019 upon evaluation today patient appears to be doing excellent in regard to her lower extremity ulcer. She has been tolerating the dressing changes without complication. Fortunately there is no signs of active infection at this time. Overall I am very pleased with the fact that she is doing so well today. READMISSION 08/12/2020 Mrs. Kareem is a now 88 year old woman we have had in this clinic on at least 2 occasions. She spent a protracted period of time here for a postsurgical wound on her left olecranon and then more recently towards the end of last year earlier this year was in for a skin tear on her left lower leg caused by her dog. She comes in today with a reasonably acute area on her left forearm again apparently caused by her dog although it was not a bite injury. She was seen in urgent care and they managed to reoppose a lot of the skin tear although the patient states that is now reopened slightly. They have been using Neosporin. She has very fragile skin probably a combination of chronic steroid use for rheumatoid [name], [name] ([id number]) arthritis [although I do not see that she is on that now] solar skin damage. I note that she takes methotrexate and Plaquenil for her rheumatoid arthritis 9/15; this patient had a skin tear on her left forearm from trauma with her dog. She was seen in urgent care and they reapposed most of the separated skin. Fortunately for her most of this is remained intact and the skin above and below the wound actually looks healthy. Where there was a separation and the 2 flaps of skin there is still an open area however the granulation tissue looks healthy here. We have been using Xeroform however I had like to change to [facility] 9/29; skin tear on her left arm from a dog scratch. The skin was reapposed in an urgent care and  surprisingly stayed viable. We used Hydrofera Blue last time the wound is smaller 10/13; patient comes back to clinic after 2 weeks for a left arm wound secondary to a dog scratch. The skin was reapposed and in urgent care. We have been using Hydrofera Blue and the wound is closed today. Readmissiono3/08/2022 Ms. Grasiela Jonsson is an 88 year old female with a past medical history of chronic venous insufficiency and lymphedema, COPD, rheumatoid arthritis and hypertension that presents with a 2-week history of wounds to her lower extremities bilaterally. She states she had a skin tag to the right posterior leg that came off and became a wound. She states the area has slowly gotten larger. She was seen by her primary care physician's office on 02/15/2022 for this issue. She was prescribed Bactroban and cefdinir. She has 2 other wounds 1 located to each leg. These were from trauma. She states one was from trying to put on compression stockings. Her ABIs in office on the left was 0.93 and 1.0 on the right. She currently denies systemic signs of infection. Electronic Signature(s) Signed: 02/17/2022 10:02:07 AM By: Kalman Shan DO Entered By: Kalman Shan on 02/17/2022 09:51:39  [name], MURADYAN ([id number]) -------------------------------------------------------------------------------- Physical Exam Details Patient Name: [name], [name] Date of Service: 02/17/2022 8:45 AM Medical Record Number: [id number] Patient Account Number: [id number] Date of Birth/Sex: 1930/10/16 (88 y.o. F) Treating RN: Carlene Coria Primary Care Provider: [place] [name] Other Clinician: Referring Provider: Gay Filler Treating Provider/Extender: Yaakov Guthrie in Treatment: 0 Constitutional . Cardiovascular . Psychiatric . Notes Right lower extremity: To the posterior aspect there is an open wound with granulation tissue and scant nonviable tissue. Tenderness to the touch. To the anterior aspect there is a  small open wound with granulation tissue present. No signs of surrounding infection. Left lower extremity: To the proximal lateral aspect there is an open wound with granulation tissue and nonviable tissue. Mostly dried blood. No signs of surrounding infection. 2+ pitting edema to the knee. Venous stasis dermatitis bilaterally Electronic Signature(s) Signed: 02/17/2022 10:02:07 AM By: Kalman Shan DO Entered By: Kalman Shan on 02/17/2022 10:00:48 [name] ([id number]) -------------------------------------------------------------------------------- Physician Orders Details Patient Name: [name] Date of Service: 02/17/2022 8:45 AM Medical Record Number: [id number] Patient Account Number: [id number] Date of Birth/Sex: 05-24-1930 (88 y.o. F) Treating RN: Carlene Coria Primary Care Provider: [place] [name] Other Clinician: Referring Provider: Gay Filler Treating Provider/Extender: Yaakov Guthrie in Treatment: 0 Verbal / Phone Orders: No Diagnosis Coding ICD-10 Coding Code Description [phone] Laceration without foreign body, left lower leg, initial encounter S81.811A Laceration without foreign body, right lower leg, initial encounter L97.812 Non-pressure chronic ulcer of other part of right lower leg with fat layer exposed L97.822 Non-pressure chronic ulcer of other part of left lower leg with fat layer exposed I87.2 Venous insufficiency (chronic) (peripheral) Follow-up Appointments o Return Appointment in 1 week. Bathing/ Shower/ Hygiene o May shower; gently cleanse wound with antibacterial soap, rinse and pat dry prior to dressing wounds Edema Control - Lymphedema / Segmental Compressive Device / Other o Tubigrip single layer applied. - size c o Elevate, Exercise Daily and Avoid Standing for Long Periods of Time. o Elevate legs to the level of the heart and pump ankles as often as possible o Elevate leg(s) parallel to the floor when  sitting. Wound Treatment Wound #13 - Lower Leg Wound Laterality: Left Cleanser: Byram Ancillary Kit - 15 Day Supply (DME) (Generic) 1 x Per Day/30 Days Discharge Instructions: Use supplies as instructed; Kit contains: (15) Saline Bullets; (15) 3x3 Gauze; 15 pr Gloves Cleanser: Soap and Water 1 x Per Day/30 Days Discharge Instructions: Gently cleanse wound with antibacterial soap, rinse and pat dry prior to dressing wounds Topical: Mupirocin Ointment 1 x Per Day/30 Days Discharge Instructions: Apply as directed by provider. Primary Dressing: Xeroform-HBD 2x2 (in/in) (DME) (Generic) 1 x Per Day/30 Days Discharge Instructions: Apply Xeroform-HBD 2x2 (in/in) as directed Secondary Dressing: ABD Pad 5x9 (in/in) (DME) (Generic) 1 x Per Day/30 Days Discharge Instructions: Cover with ABD pad Secured With: Medipore Tape - 60M Medipore H Soft Cloth Surgical Tape, 2x2 (in/yd) (DME) (Generic) 1 x Per Day/30 Days Wound #14 - Lower Leg Wound Laterality: Right, Anterior Cleanser: Byram Ancillary Kit - 15 Day Supply (DME) (Generic) 1 x Per Day/30 Days Discharge Instructions: Use supplies as instructed; Kit contains: (15) Saline Bullets; (15) 3x3 Gauze; 15 pr Gloves Cleanser: Soap and Water 1 x Per Day/30 Days Discharge Instructions: Gently cleanse wound with antibacterial soap, rinse and pat dry prior to dressing wounds Topical: Mupirocin Ointment 1 x Per Day/30 Days Discharge Instructions: Apply as directed by provider. Primary Dressing: Xeroform-HBD 2x2 (in/in) (DME) (Generic) 1 x  Per Day/30 Days Discharge Instructions: Apply Xeroform-HBD 2x2 (in/in) as directed Secondary Dressing: ABD Pad 5x9 (in/in) (DME) (Generic) 1 x Per Day/30 Days LUCIENNE, SAWYERS ([id number]) Discharge Instructions: Cover with ABD pad Secured With: Medipore Tape - 42M Medipore H Soft Cloth Surgical Tape, 2x2 (in/yd) (DME) (Generic) 1 x Per Day/30 Days Wound #15 - Lower Leg Wound Laterality: Right Cleanser: Byram Ancillary Kit - 15  Day Supply (DME) (Generic) 1 x Per Day/30 Days Discharge Instructions: Use supplies as instructed; Kit contains: (15) Saline Bullets; (15) 3x3 Gauze; 15 pr Gloves Cleanser: Soap and Water 1 x Per Day/30 Days Discharge Instructions: Gently cleanse wound with antibacterial soap, rinse and pat dry prior to dressing wounds Primary Dressing: Hydrofera Blue Ready Transfer Foam, 2.5x2.5 (in/in) (DME) (Generic) 1 x Per Day/30 Days Discharge Instructions: Apply Hydrofera Blue Ready to wound bed as directed Secondary Dressing: ABD Pad 5x9 (in/in) (Generic) 1 x Per Day/30 Days Discharge Instructions: Cover with ABD pad Secured With: Medipore Tape - 42M Medipore H Soft Cloth Surgical Tape, 2x2 (in/yd) (DME) (Generic) 1 x Per Day/30 Days Electronic Signature(s) Signed: 02/21/2022 9:16:25 AM By: Kalman Shan DO Signed: 02/21/2022 4:21:53 PM By: Carlene Coria RN Previous Signature: 02/17/2022 10:34:43 AM Version By: Kalman Shan DO Entered By: Carlene Coria on 02/21/2022 09:13:13 [name] ([id number]) -------------------------------------------------------------------------------- Problem List Details Patient Name: [name] Date of Service: 02/17/2022 8:45 AM Medical Record Number: [id number] Patient Account Number: [id number] Date of Birth/Sex: 01-Apr-1930 (88 y.o. F) Treating RN: Carlene Coria Primary Care Provider: [place] [name] Other Clinician: Referring Provider: Gay Filler Treating Provider/Extender: Yaakov Guthrie in Treatment: 0 Active Problems ICD-10 Encounter Code Description Active Date MDM Diagnosis S81.812A Laceration without foreign body, left lower leg, initial encounter 02/17/2022 No Yes S81.811A Laceration without foreign body, right lower leg, initial encounter 02/17/2022 No Yes L97.812 Non-pressure chronic ulcer of other part of right lower leg with fat layer 02/17/2022 No Yes exposed L97.822 Non-pressure chronic ulcer of other part of left lower leg with fat  layer 02/17/2022 No Yes exposed I87.2 Venous insufficiency (chronic) (peripheral) 02/17/2022 No Yes Inactive Problems Resolved Problems Electronic Signature(s) Signed: 02/17/2022 10:02:07 AM By: Kalman Shan DO Entered By: Kalman Shan on 02/17/2022 09:48:15 [name] ([id number]) -------------------------------------------------------------------------------- Progress Note Details Patient Name: [name] Date of Service: 02/17/2022 8:45 AM Medical Record Number: [id number] Patient Account Number: [id number] Date of Birth/Sex: 08-09-1930 (88 y.o. F) Treating RN: Carlene Coria Primary Care Provider: [place] [name] Other Clinician: Referring Provider: Gay Filler Treating Provider/Extender: Yaakov Guthrie in Treatment: 0 Subjective Chief Complaint Information obtained from Patient Right leg skin tear due to A dog scratch 08/12/2020; patient returns to clinic with a skin tear on her left forearm for our review 02/17/2022; patient presents with multiple open wounds to her lower extremities bilaterally due to trauma. History of Present Illness (HPI) 88 year old patient who is looking much younger than his stated age comes in with a history of having a laceration to her left lower extremity which she sustained about a week ago. She has several medical comorbidities including degenerative arthritis, scoliosis, history of back surgery, pacemaker placement,AMA positive, ulnar neuropathy and left carpal tunnel syndrome. she is also had sclerotherapy for varicose veins in May 2003. her medications include some prednisone at the present time which she may be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 beneficial and is helping the wound to heal more effectively. Overall I am extremely pleased at this time with what I am seeing. 08/22/2019 on evaluation today patient actually appears to be doing quite well with regard to her right lower extremity. She has been tolerating the dressing changes without complication. Fortunately there is no signs of active infection at this time. She still complains about the Tubigrip but nonetheless I think this is something that is of utmost importance for her to continue to wear if she is going to see this area heal appropriately. 08/29/2019 on evaluation today patient actually appears to be doing well visually in regard to her wound.  Unfortunately in regard to overall pain she seems to be having more pain today which I am somewhat concerned about. There does not appear to be any signs of active infection that I can tell but again with increased pain that is definitely a concern here today. I do think it may be time to switch up the dressing as well to something that will be a little bit more effective hopefully and new tissue growth she has done well with collagen in the past. 09/05/2019 on evaluation today patient actually appears to be doing well with regard to her leg ulcer. She has been tolerating the dressing changes without complication. Fortunately there is no signs of active infection. We did obtain a wound culture last week due to the fact that she still is very touchy as far as the surface of the wound is concerned. Unfortunately I am not sure what happened to that culture as there is  no record in the computer system. Nonetheless we need to follow-up on this and try to see if we can identify what exactly happened with this specimen as we have not gotten a result back either obviously. 09/12/2019 on evaluation today patient actually appears to be showing signs of good improvement upon evaluation today. She states her pain is not nearly as bad as what it was which is also good news. She did have a wound culture which was reviewed today that showed no growth of bacteria which is good news. This coupled with the fact that her wound appears to be healing better, measuring smaller, and overall even the slough buildup is minimal compared to what we have seen previously, I feel like she is actually showing signs of excellent improvement today. 09/19/2019 on evaluation today patient appears to be doing well with regard to her wounds on evaluation today. She is showing signs of continued improvement week by week and everything is measuring significantly smaller which is great news. Overall very pleased with the progress that she has made. 09/26/2019 on evaluation today patient appears to be doing fairly well with regard to her right lower extremity ulcer. She does continue to have some drainage and again the smaller of the 2 wounds is not making as much progress as I like to see. We have been doing the silver collagen for some time I think we may want to switch to [facility] she has a little bit of hyper granular tissue this may help to keep things from becoming too dramatic in that regard. Fortunately there is no signs of infection at this time. 10/03/2019 on evaluation today patient appears to be doing fairly well with regard to her lower extremity ulcers at this time. She has been tolerating the dressing changes without complication. Fortunately the wound is measuring smaller which is great news she continues to make great progress. 10/17/2019 on evaluation today patient appears to be  doing well with regard to her wounds of her right lower extremity. She states she has been having some increased pain apparently we gave her a little bit smaller size Tubigrip last time she was here this may have been what was causing some issues. She is out of the Tubigrip that we had ordered for her and therefore did not have any of the smaller size. Fortunately there is no evidence of active infection at this time. No fever chills noted 10/24/2019 upon evaluation today patient's wounds actually appear to be showing signs of good improvement which is excellent news. She has been tolerating  the dressing changes without complication and overall I am rather pleased at this point with how things seem to be progressing. The patient likewise is very happy. She still was not happy with having to use the Tubigrip she states that bunches up in her shoe. 10/31/2019 on evaluation today patient actually appears to be doing quite well with regard to her leg ulcer. She has been tolerating the dressing changes without complication. Fortunately there is no signs of active infection at this time. Overall I am very pleased with the progress that she is made. She does wonder if we could switch to the bordered adhesive Hydrofera Blue I think that something we can definitely look into for her. 11/15/2019 patient on evaluation today appears to be making excellent progress. Her wound is significantly smaller and the wound bed appears to be dramatically improved compared to even the last visit. Fortunately she is having really little to no pain at this time. No fevers, chills, nausea, vomiting, or diarrhea. 11/25/2019 on evaluation today patient appears to be doing well with regard to her lower extremity ulcer. She has been tolerating dressing changes without complication. Fortunately there is no signs of active infection at this time. No fevers, chills, nausea, vomiting, or diarrhea. 12/02/2019 on evaluation today patient  unfortunately is not doing quite as well as what we would have liked to have seen based on last week's evaluation. Fortunately there is no signs of active infection at this time. With that being said she is continuing to experience some issues with hyper granulation she was doing much better with the [facility] prior to putting the contact layer in between her skin and the [facility] I think that we need to go back to that. She just can have to be very careful with removing the dressing in order to ensure it does not pull skin and get stuck. 12/16/2019 on evaluation today patient appears to be doing better her wound is measuring significantly smaller at this time compared to where things were previous. Fortunately there is no evidence of active infection at this point which is also good news. No fever chills noted. 12/23/2019 upon evaluation today patient appears to be doing excellent in regard to her lower extremity ulcer. She has been tolerating the dressing changes without complication. Fortunately there is no signs of active infection at this time. Overall I am very pleased with the fact that she is doing so well today. READMISSION 08/12/2020 [name] ([id number]) Mrs. Renn is a now 88 year old woman we have had in this clinic on at least 2 occasions. She spent a protracted period of time here for a postsurgical wound on her left olecranon and then more recently towards the end of last year earlier this year was in for a skin tear on her left lower leg caused by her dog. She comes in today with a reasonably acute area on her left forearm again apparently caused by her dog although it was not a bite injury. She was seen in urgent care and they managed to reoppose a lot of the skin tear although the patient states that is now reopened slightly. They have been using Neosporin. She has very fragile skin probably a combination of chronic steroid use for rheumatoid arthritis [although I do  not see that she is on that now] solar skin damage. I note that she takes methotrexate and Plaquenil for her rheumatoid arthritis 9/15; this patient had a skin tear on her left forearm from trauma with  her dog. She was seen in urgent care and they reapposed most of the separated skin. Fortunately for her most of this is remained intact and the skin above and below the wound actually looks healthy. Where there was a separation and the 2 flaps of skin there is still an open area however the granulation tissue looks healthy here. We have been using Xeroform however I had like to change to [facility] 9/29; skin tear on her left arm from a dog scratch. The skin was reapposed in an urgent care and surprisingly stayed viable. We used Hydrofera Blue last time the wound is smaller 10/13; patient comes back to clinic after 2 weeks for a left arm wound secondary to a dog scratch. The skin was reapposed and in urgent care. We have been using Hydrofera Blue and the wound is closed today. Readmission 02/17/2022 Ms. Marybeth Dandy is an 88 year old female with a past medical history of chronic venous insufficiency and lymphedema, COPD, rheumatoid arthritis and hypertension that presents with a 2-week history of wounds to her lower extremities bilaterally. She states she had a skin tag to the right posterior leg that came off and became a wound. She states the area has slowly gotten larger. She was seen by her primary care physician's office on 02/15/2022 for this issue. She was prescribed Bactroban and cefdinir. She has 2 other wounds 1 located to each leg. These were from trauma. She states one was from trying to put on compression stockings. Her ABIs in office on the left was 0.93 and 1.0 on the right. She currently denies systemic signs of infection. Patient History Information obtained from Patient. Allergies Penicillins (Severity: Moderate, Reaction: rash), Sporanox (Severity: Moderate, Reaction: elevated  LFT), adhesive (Severity: Moderate, Reaction: skin alergy), codeine (Severity: Moderate, Reaction: n/v), Cipro (Severity: Moderate, Reaction: diarrhea), bioxin (Reaction: upset stomach), doxycycline (Reaction: flu like systems), azithromycin (Severity: Mild, Reaction: upset stomach) Family History Heart Disease - Siblings,Father, No family history of Cancer, Diabetes, Hereditary Spherocytosis, Hypertension, Kidney Disease, Lung Disease, Seizures, Stroke, Thyroid Problems, Tuberculosis. Social History Never smoker, Alcohol Use - Daily, Drug Use - No History, Caffeine Use - Never. Medical History Eyes Patient has history of Cataracts, Glaucoma, Optic Neuritis Ear/Nose/Mouth/Throat Patient has history of Chronic sinus problems/congestion, Middle ear problems Cardiovascular Patient has history of Hypertension Musculoskeletal Patient has history of Rheumatoid Arthritis Medical And Surgical History Notes Constitutional Symptoms (General Health) Back pain Ear/Nose/Mouth/Throat bilateral hearing aides Objective Constitutional Vitals Time Taken: 8:57 AM, Height: 60 in, Source: Stated, Weight: 132 lbs, BMI: 25.8, Temperature: 98.2 F, Pulse: 76 bpm, Respiratory Rate: 18 breaths/min, Blood Pressure: 150/77 mmHg. General Notes: Right lower extremity: To the posterior aspect there is an open wound with granulation tissue and scant nonviable tissue. Tenderness to the touch. To the anterior aspect there is a small open wound with granulation tissue present. No signs of surrounding infection. [name], [name] ([id number]) Left lower extremity: To the proximal lateral aspect there is an open wound with granulation tissue and nonviable tissue. Mostly dried blood. No signs of surrounding infection. 2+ pitting edema to the knee. Venous stasis dermatitis bilaterally Integumentary (Hair, Skin) Wound #13 status is Open. Original cause of wound was Trauma. The date acquired was: 02/09/2022. The wound is  located on the Left Lower Leg. The wound measures 1.5cm length x 1.3cm width x 0.1cm depth; 1.532cm^2 area and 0.153cm^3 volume. There is no tunneling or undermining noted. There is a medium amount of serosanguineous drainage noted. There is no granulation within  the wound bed. There is a large (67-100%) amount of necrotic tissue within the wound bed including Eschar and Adherent Slough. Wound #14 status is Open. Original cause of wound was Trauma. The date acquired was: 02/09/2022. The wound is located on the Right,Anterior Lower Leg. The wound measures 1.2cm length x 0.6cm width x 0.1cm depth; 0.565cm^2 area and 0.057cm^3 volume. There is Fat Layer (Subcutaneous Tissue) exposed. There is no tunneling or undermining noted. There is a medium amount of serosanguineous drainage noted. There is a large (67-100%) amount of necrotic tissue within the wound bed including Adherent Slough. Wound #15 status is Open. Original cause of wound was Trauma. The date acquired was: 02/09/2022. The wound is located on the Right Lower Leg. The wound measures 5cm length x 3cm width x 0.1cm depth; 11.781cm^2 area and 1.178cm^3 volume. There is Fat Layer (Subcutaneous Tissue) exposed. There is no tunneling or undermining noted. There is a medium amount of serosanguineous drainage noted. There is medium (34-66%) red granulation within the wound bed. There is a medium (34-66%) amount of necrotic tissue within the wound bed including Adherent Slough. Assessment Active Problems ICD-10 Laceration without foreign body, left lower leg, initial encounter Laceration without foreign body, right lower leg, initial encounter Non-pressure chronic ulcer of other part of right lower leg with fat layer exposed Non-pressure chronic ulcer of other part of left lower leg with fat layer exposed Venous insufficiency (chronic) (peripheral) Patient presents for nonhealing ulcers to her lower extremities bilaterally. She has 1 large wound to  the posterior aspect of the right leg that is tender to palpation. She is currently on cefdinir and mupirocin for the wound bed. I recommended she continue this treatment and add Hydrofera Blue to the wound bed. She also has other small skin tears. The one to the left leg may need debridement in the near future. For now I recommended Xeroform dressings. She has evidence of chronic venous insufficiency on exam. For now I think she would do best with Tubigrip to help with compression. We gave her this in office today. As the wounds improved she would benefit from going back to her compression stockings versus in office wraps. Follow-up in 1 week. 49 minutes was spent on the encounter including face-to-face, EMR review and coordination of care Plan 1. Hydrofera Blue 2. Xeroform 3. Tubigrip 4. Follow-up in 1 week Electronic Signature(s) Signed: 02/17/2022 10:02:07 AM By: Kalman Shan DO Entered By: Kalman Shan on 02/17/2022 10:01:30 [name] ([id number]) -------------------------------------------------------------------------------- ROS/PFSH Details Patient Name: [name] Date of Service: 02/17/2022 8:45 AM Medical Record Number: [id number] Patient Account Number: [id number] Date of Birth/Sex: [birth year] (88 y.o. F) Treating RN: Carlene Coria Primary Care Provider: [place] [name] Other Clinician: Referring Provider: Gay Filler Treating Provider/Extender: Yaakov Guthrie in Treatment: 0 Information Obtained From Patient Constitutional Symptoms (General Health) Medical History: Past Medical History Notes: Back pain Eyes Medical History: Positive for: Cataracts; Glaucoma; Optic Neuritis Ear/Nose/Mouth/Throat Medical History: Positive for: Chronic sinus problems/congestion; Middle ear problems Past Medical History Notes: bilateral hearing aides Cardiovascular Medical History: Positive for: Hypertension Musculoskeletal Medical History: Positive for:  Rheumatoid Arthritis HBO Extended History Items Eyes: Eyes: Ear/Nose/Mouth/Throat: Ear/Nose/Mouth/Throat: Cataracts Glaucoma Chronic sinus problems/congestion Middle ear problems Immunizations Pneumococcal Vaccine: Received Pneumococcal Vaccination: No Implantable Devices Yes Family and Social History Cancer: No; Diabetes: No; Heart Disease: Yes - Siblings,Father; Hereditary Spherocytosis: No; Hypertension: No; Kidney Disease: No; Lung Disease: No; Seizures: No; Stroke: No; Thyroid Problems: No; Tuberculosis: No; Never smoker; Alcohol Use: Daily; Drug  Use: No History; Caffeine Use: Never; Financial Concerns: No; Food, Clothing or Shelter Needs: No; Support System Lacking: No; Transportation Concerns: No Electronic Signature(s) Signed: 02/17/2022 10:02:07 AM By: Kalman Shan DO Signed: 02/21/2022 4:21:53 PM By: Carlene Coria RN Entered By: Carlene Coria on 02/17/2022 09:01:06 [name] ([id number]) -------------------------------------------------------------------------------- [place] Details Patient Name: [name] Date of Service: 02/17/2022 Medical Record Number: [id number] Patient Account Number: [id number] Date of Birth/Sex: 1930/05/26 (88 y.o. F) Treating RN: Carlene Coria Primary Care Provider: [place] [name] Other Clinician: Referring Provider: Gay Filler Treating Provider/Extender: Yaakov Guthrie in Treatment: 0 Diagnosis Coding ICD-10 Codes Code Description [phone] Laceration without foreign body, left lower leg, initial encounter S81.811A Laceration without foreign body, right lower leg, initial encounter L97.812 Non-pressure chronic ulcer of other part of right lower leg with fat layer exposed L97.822 Non-pressure chronic ulcer of other part of left lower leg with fat layer exposed I87.2 Venous insufficiency (chronic) (peripheral) Facility Procedures CPT4 Code: [id number] Description: 99213 - WOUND CARE VISIT-LEV 3 EST PT Modifier: Quantity:  1 Physician Procedures CPT4 Code: 6659935 Description: 70177 - WC PHYS LEVEL 4 - EST PT Modifier: Quantity: 1 CPT4 Code: Description: ICD-10 Diagnosis Description S81.812A Laceration without foreign body, left lower leg, initial encounter S81.811A Laceration without foreign body, right lower leg, initial encounter L97.812 Non-pressure chronic ulcer of other part of right  lower leg with fat la L97.822 Non-pressure chronic ulcer of other part of left lower leg with fat lay Modifier: yer exposed er exposed Quantity: Electronic Signature(s) Signed: 02/18/2022 11:37:58 AM By: Carlene Coria RN Signed: 02/21/2022 9:05:59 AM By: Kalman Shan DO Previous Signature: 02/17/2022 10:02:07 AM Version By: Kalman Shan DO Entered By: Carlene Coria on 02/18/2022 11:37:58

## 2022-02-21 NOTE — Progress Notes (Signed)
[name], [name] ([id number]) Visit Report for 02/17/2022 Allergy List Details Patient Name: [name], [name]. Date of Service: 02/17/2022 8:45 AM Medical Record Number: [id number] Patient Account Number: [id number] Date of Birth/Sex: 1930-01-08 (88 y.o. F) Treating RN: Yevonne Pax Primary Care Aya Geisel: Aram Beecham Other Clinician: Referring Nasiah Lehenbauer: Arvilla Meres Treating Shirlena Brinegar/Extender: Tilda Franco in Treatment: 0 Allergies Active Allergies Penicillins Reaction: rash Severity: Moderate Sporanox Reaction: elevated LFT Severity: Moderate adhesive Reaction: skin alergy Severity: Moderate codeine Reaction: n/v Severity: Moderate Cipro Reaction: diarrhea Severity: Moderate bioxin Reaction: upset stomach Type: Medication doxycycline Reaction: flu like systems azithromycin Reaction: upset stomach Severity: Mild Allergy Notes Electronic Signature(s) Signed: 02/21/2022 4:21:53 PM By: Yevonne Pax RN Entered By: Yevonne Pax on 02/17/2022 08:59:15 [name] ([id number]) -------------------------------------------------------------------------------- Arrival Information Details Patient Name: [name] Date of Service: 02/17/2022 8:45 AM Medical Record Number: [id number] Patient Account Number: [id number] Date of Birth/Sex: [birth year] (88 y.o. F) Treating RN: Yevonne Pax Primary Care Timoteo Carreiro: Aram Beecham Other Clinician: Referring Adham Johnson: Arvilla Meres Treating Tyresha Fede/Extender: Tilda Franco in Treatment: 0 Visit Information Patient Arrived: Wheel Chair Arrival Time: 08:51 Accompanied By: son Transfer Assistance: None Patient Identification Verified: Yes Secondary Verification Process Completed: Yes Patient Requires Transmission-Based Precautions: No Patient Has Alerts: No History Since Last Visit All ordered tests and consults were completed: No Added or deleted any medications: No Any new allergies or adverse reactions:  No Had a fall or experienced change in activities of daily living that may affect risk of falls: No Signs or symptoms of abuse/neglect since last visito No Hospitalized since last visit: No Implantable device outside of the clinic excluding cellular tissue based products placed in the center since last visit: No Has Dressing in Place as Prescribed: Yes Electronic Signature(s) Signed: 02/21/2022 4:21:53 PM By: Yevonne Pax RN Entered By: Yevonne Pax on 02/17/2022 08:57:02 [name] ([id number]) -------------------------------------------------------------------------------- Clinic Level of Care Assessment Details Patient Name: [name] Date of Service: 02/17/2022 8:45 AM Medical Record Number: [id number] Patient Account Number: [id number] Date of Birth/Sex: 10/21/1930 (88 y.o. F) Treating RN: Yevonne Pax Primary Care Maryn Freelove: Aram Beecham Other Clinician: Referring Eathen Budreau: Arvilla Meres Treating Leno Mathes/Extender: Tilda Franco in Treatment: 0 Clinic Level of Care Assessment Items TOOL 1 Quantity Score X - Use when EandM and Procedure is performed on INITIAL visit 1 0 ASSESSMENTS - Nursing Assessment / Reassessment X - General Physical Exam (combine w/ comprehensive assessment (listed just below) when performed on new 1 20 pt. evals) X- 1 25 Comprehensive Assessment (HX, ROS, Risk Assessments, Wounds Hx, etc.) ASSESSMENTS - Wound and Skin Assessment / Reassessment  - Dermatologic / Skin Assessment (not related to wound area) 0 ASSESSMENTS - Ostomy and/or Continence Assessment and Care  - Incontinence Assessment and Management 0  - 0 Ostomy Care Assessment and Management (repouching, etc.) PROCESS - Coordination of Care X - Simple Patient / Family Education for ongoing care 1 15  - 0 Complex (extensive) Patient / Family Education for ongoing care  - 0 Staff obtains Chiropractor, Records, Test Results / Process Orders  - 0 Staff telephones  HHA, Nursing Homes / Clarify orders / etc  - 0 Routine Transfer to another Facility (non-emergent condition)  - 0 Routine Hospital Admission (non-emergent condition) X- 1 15 New Admissions / Manufacturing engineer / Ordering NPWT, Apligraf, etc.  - 0 Emergency Hospital Admission (emergent condition) PROCESS - Special Needs  - Pediatric / Minor Patient Management 0  - 0 Isolation Patient Management  - 0 Hearing /  Language / Visual special needs  - 0 Assessment of Community assistance (transportation, D/C planning, etc.)  - 0 Additional assistance / Altered mentation  - 0 Support Surface(s) Assessment (bed, cushion, seat, etc.) INTERVENTIONS - Miscellaneous  - External ear exam 0  - 0 Patient Transfer (multiple staff / Nurse, adult / Similar devices)  - 0 Simple Staple / Suture removal (25 or less)  - 0 Complex Staple / Suture removal (26 or more)  - 0 Hypo/Hyperglycemic Management (do not check if billed separately) X- 1 15 Ankle / Brachial Index (ABI) - do not check if billed separately Has the patient been seen at the hospital within the last three years: Yes Total Score: 90 Level Of Care: New/Established - Level 3 [name], [name] ([id number]) Electronic Signature(s) Signed: 02/21/2022 4:21:53 PM By: Yevonne Pax RN Entered By: Yevonne Pax on 02/18/2022 11:37:45 [name] ([id number]) -------------------------------------------------------------------------------- Encounter Discharge Information Details Patient Name: [name] Date of Service: 02/17/2022 8:45 AM Medical Record Number: [id number] Patient Account Number: [id number] Date of Birth/Sex: 01/04/1930 (88 y.o. F) Treating RN: Yevonne Pax Primary Care Makiah Foye: Aram Beecham Other Clinician: Referring Avonelle Viveros: Arvilla Meres Treating Trinady Milewski/Extender: Tilda Franco in Treatment: 0 Encounter Discharge Information Items Post Procedure Vitals Discharge  Condition: Stable Temperature (F): 98.2 Ambulatory Status: Ambulatory Pulse (bpm): 76 Discharge Destination: Home Respiratory Rate (breaths/min): 18 Transportation: Private Auto Blood Pressure (mmHg): 150/77 Accompanied By: self Schedule Follow-up Appointment: Yes Clinical Summary of Care: Patient Declined Electronic Signature(s) Signed: 02/18/2022 11:39:57 AM By: Yevonne Pax RN Entered By: Yevonne Pax on 02/18/2022 11:39:57 [name] ([id number]) -------------------------------------------------------------------------------- Lower Extremity Assessment Details Patient Name: [name] Date of Service: 02/17/2022 8:45 AM Medical Record Number: [id number] Patient Account Number: [id number] Date of Birth/Sex: November 23, 1930 (88 y.o. F) Treating RN: Yevonne Pax Primary Care Delonna Ney: Aram Beecham Other Clinician: Referring Delroy Ordway: Arvilla Meres Treating Tarig Zimmers/Extender: Tilda Franco in Treatment: 0 Edema Assessment Assessed: [Left: No] [Right: No] Edema: [Left: Yes] [Right: Yes] Calf Left: Right: Point of Measurement: 32 cm From Medial Instep 34 cm 37 cm Ankle Left: Right: Point of Measurement: 10 cm From Medial Instep 22 cm 27 cm Knee To Floor Left: Right: From Medial Instep 42 cm 42 cm Vascular Assessment Pulses: Dorsalis Pedis Palpable: [Left:Yes] [Right:Yes] Blood Pressure: Brachial: [Left:150] [Right:150] Ankle: [Left:Dorsalis Pedis: 140 0.93] [Right:Dorsalis Pedis: 150 1.00] Electronic Signature(s) Signed: 02/17/2022 9:35:24 AM By: Yevonne Pax RN Entered By: Yevonne Pax on 02/17/2022 09:35:24 [name] ([id number]) -------------------------------------------------------------------------------- Multi Wound Chart Details Patient Name: [name] Date of Service: 02/17/2022 8:45 AM Medical Record Number: [id number] Patient Account Number: [id number] Date of Birth/Sex: 1930/08/23 (88 y.o. F) Treating RN: Yevonne Pax Primary  Care Jennene Downie: Aram Beecham Other Clinician: Referring Jaleiah Asay: Arvilla Meres Treating Sibley Rolison/Extender: Tilda Franco in Treatment: 0 Vital Signs Height(in): 60 Pulse(bpm): 76 Weight(lbs): 132 Blood Pressure(mmHg): 150/77 Body Mass Index(BMI): 25.8 Temperature(F): 98.2 Respiratory Rate(breaths/min): 18 Photos: Wound Location: Left Lower Leg Right, Anterior Lower Leg Right Lower Leg Wounding Event: Trauma Trauma Trauma Primary Etiology: Skin Tear Venous Leg Ulcer Venous Leg Ulcer Comorbid History: Cataracts, Glaucoma, Optic Cataracts, Glaucoma, Optic Cataracts, Glaucoma, Optic Neuritis, Chronic sinus Neuritis, Chronic sinus Neuritis, Chronic sinus problems/congestion, Middle ear problems/congestion, Middle ear problems/congestion, Middle ear problems, Hypertension, problems, Hypertension, problems, Hypertension, Rheumatoid Arthritis Rheumatoid Arthritis Rheumatoid Arthritis Date Acquired: 02/09/2022 02/09/2022 02/09/2022 Weeks of Treatment: 0 0 0 Wound Status: Open Open Open Wound Recurrence: No No No Measurements L x W x D (cm) 1.5x1.3x0.1 1.2x0.6x0.1  5x3x0.1 Area (cm) : 1.532 0.565 11.781 Volume (cm) : 0.153 0.057 1.178 Classification: Full Thickness Without Exposed Full Thickness Without Exposed Full Thickness Without Exposed Support Structures Support Structures Support Structures Exudate Amount: Medium Medium Medium Exudate Type: Serosanguineous Serosanguineous Serosanguineous Exudate Color: red, brown red, brown red, brown Granulation Amount: None Present (0%) N/A Medium (34-66%) Granulation Quality: N/A N/A Red Necrotic Amount: Large (67-100%) N/A Medium (34-66%) Necrotic Tissue: Eschar, Adherent Slough Adherent [employer] Exposed Structures: Fascia: No Fat Layer (Subcutaneous Tissue): Fat Layer (Subcutaneous Tissue): Fat Layer (Subcutaneous Tissue): Yes Yes No Fascia: No Fascia: No Tendon: No Tendon: No Tendon: No Muscle: No Muscle:  No Muscle: No Joint: No Joint: No Joint: No Bone: No Bone: No Bone: No Epithelialization: None None None Treatment Notes Electronic Signature(s) Signed: 02/17/2022 10:02:07 AM By: Geralyn Corwin DO Entered By: Geralyn Corwin on 02/17/2022 09:48:23 [name] ([id number]) -------------------------------------------------------------------------------- Multi-Disciplinary Care Plan Details Patient Name: [name] Date of Service: 02/17/2022 8:45 AM Medical Record Number: [id number] Patient Account Number: [id number] Date of Birth/Sex: Feb 06, 1930 (88 y.o. F) Treating RN: Yevonne Pax Primary Care Jeiry Birnbaum: Aram Beecham Other Clinician: Referring Suleika Donavan: Arvilla Meres Treating Jorje Vanatta/Extender: Tilda Franco in Treatment: 0 Active Inactive Wound/Skin Impairment Nursing Diagnoses: Knowledge deficit related to ulceration/compromised skin integrity Goals: Patient/caregiver will verbalize understanding of skin care regimen Date Initiated: 02/17/2022 Target Resolution Date: 03/20/2022 Goal Status: Active Ulcer/skin breakdown will have a volume reduction of 30% by week 4 Date Initiated: 02/17/2022 Target Resolution Date: 03/20/2022 Goal Status: Active Ulcer/skin breakdown will have a volume reduction of 50% by week 8 Date Initiated: 02/17/2022 Target Resolution Date: 04/19/2022 Goal Status: Active Ulcer/skin breakdown will have a volume reduction of 80% by week 12 Date Initiated: 02/17/2022 Target Resolution Date: 05/20/2022 Goal Status: Active Ulcer/skin breakdown will heal within 14 weeks Date Initiated: 02/17/2022 Target Resolution Date: 06/19/2022 Goal Status: Active Interventions: Assess patient/caregiver ability to obtain necessary supplies Assess patient/caregiver ability to perform ulcer/skin care regimen upon admission and as needed Assess ulceration(s) every visit Notes: Electronic Signature(s) Signed: 02/21/2022 4:21:53 PM By: Yevonne Pax RN Entered By:  Yevonne Pax on 02/17/2022 09:42:25 [name] ([id number]) -------------------------------------------------------------------------------- Pain Assessment Details Patient Name: [name] Date of Service: 02/17/2022 8:45 AM Medical Record Number: [id number] Patient Account Number: [id number] Date of Birth/Sex: [birth year] (88 y.o. F) Treating RN: Yevonne Pax Primary Care Auriel Kist: Aram Beecham Other Clinician: Referring Keysean Savino: Arvilla Meres Treating Faruq Rosenberger/Extender: Tilda Franco in Treatment: 0 Active Problems Location of Pain Severity and Description of Pain Patient Has Paino No Site Locations Pain Management and Medication Current Pain Management: Electronic Signature(s) Signed: 02/21/2022 4:21:53 PM By: Yevonne Pax RN Entered By: Yevonne Pax on 02/17/2022 08:57:17 [name] ([id number]) -------------------------------------------------------------------------------- Patient/Caregiver Education Details Patient Name: [name] Date of Service: 02/17/2022 8:45 AM Medical Record Number: [id number] Patient Account Number: [id number] Date of Birth/Gender: 1930/04/24 (88 y.o. F) Treating RN: Yevonne Pax Primary Care Physician: Aram Beecham Other Clinician: Referring Physician: Arvilla Meres Treating Physician/Extender: Tilda Franco in Treatment: 0 Education Assessment Education Provided To: Patient Education Topics Provided Wound/Skin Impairment: Methods: Explain/Verbal Responses: State content correctly Electronic Signature(s) Signed: 02/21/2022 4:21:53 PM By: Yevonne Pax RN Entered By: Yevonne Pax on 02/18/2022 11:38:11 [name] ([id number]) -------------------------------------------------------------------------------- Wound Assessment Details Patient Name: [name] Date of Service: 02/17/2022 8:45 AM Medical Record Number: [id number] Patient Account Number: [id number] Date of Birth/Sex: Dec 03, 1930 (88  y.o. F) Treating RN: Yevonne Pax Primary Care Fionna Merriott: Aram Beecham Other  Clinician: Referring Daleyza Gadomski: Arvilla MeresMeyer, Ashley Treating Verania Salberg/Extender: Tilda FrancoHoffman, Jessica Weeks in Treatment: 0 Wound Status Wound Number: 13 Primary Skin Tear Etiology: Wound Location: Left Lower Leg Wound Open Wounding Event: Trauma Status: Date Acquired: 02/09/2022 Comorbid Cataracts, Glaucoma, Optic Neuritis, Chronic sinus Weeks Of Treatment: 0 History: problems/congestion, Middle ear problems, Hypertension, Clustered Wound: No Rheumatoid Arthritis Photos Wound Measurements Length: (cm) 1.5 Width: (cm) 1.3 Depth: (cm) 0.1 Area: (cm) 1.532 Volume: (cm) 0.153 % Reduction in Area: % Reduction in Volume: Epithelialization: None Tunneling: No Undermining: No Wound Description Classification: Full Thickness Without Exposed Support Structu Exudate Amount: Medium Exudate Type: Serosanguineous Exudate Color: red, brown res Foul Odor After Cleansing: No Slough/Fibrino Yes Wound Bed Granulation Amount: None Present (0%) Exposed Structure Necrotic Amount: Large (67-100%) Fascia Exposed: No Necrotic Quality: Eschar, Adherent Slough Fat Layer (Subcutaneous Tissue) Exposed: No Tendon Exposed: No Muscle Exposed: No Joint Exposed: No Bone Exposed: No Treatment Notes Wound #13 (Lower Leg) Wound Laterality: Left Cleanser Peri-Wound Care Topical Mupirocin Ointment Natasha BassetHENRY, [name] ([id number]) Discharge Instruction: Apply as directed by Harmoni Lucus. Primary Dressing Xeroform-HBD 2x2 (in/in) Discharge Instruction: Apply Xeroform-HBD 2x2 (in/in) as directed Secondary Dressing ABD Pad 5x9 (in/in) Discharge Instruction: Cover with ABD pad Secured With Medipore Tape - 88M Medipore H Soft Cloth Surgical Tape, 2x2 (in/yd) Compression Wrap Compression Stockings Add-Ons Electronic Signature(s) Signed: 02/21/2022 4:21:53 PM By: Yevonne PaxEpps, Carrie RN Entered By: Yevonne PaxEpps, Carrie on 02/17/2022 09:23:58 Natasha BassetHENRY,  [name] ([id number]) -------------------------------------------------------------------------------- Wound Assessment Details Patient Name: Natasha BassetHENRY, [name] Date of Service: 02/17/2022 8:45 AM Medical Record Number: [id number] Patient Account Number: [id number] Date of Birth/Sex: 06/03/1930 (88 y.o. F) Treating RN: Yevonne PaxEpps, Carrie Primary Care Marilynne Dupuis: Aram BeechamSparks, Jeffrey Other Clinician: Referring Lainy Wrobleski: Arvilla MeresMeyer, Ashley Treating Siriah Treat/Extender: Tilda FrancoHoffman, Jessica Weeks in Treatment: 0 Wound Status Wound Number: 14 Primary Venous Leg Ulcer Etiology: Wound Location: Right, Anterior Lower Leg Wound Open Wounding Event: Trauma Status: Date Acquired: 02/09/2022 Comorbid Cataracts, Glaucoma, Optic Neuritis, Chronic sinus Weeks Of Treatment: 0 History: problems/congestion, Middle ear problems, Hypertension, Clustered Wound: No Rheumatoid Arthritis Photos Wound Measurements Length: (cm) 1.2 Width: (cm) 0.6 Depth: (cm) 0.1 Area: (cm) 0.565 Volume: (cm) 0.057 % Reduction in Area: % Reduction in Volume: Epithelialization: None Tunneling: No Undermining: No Wound Description Classification: Full Thickness Without Exposed Support Struc Exudate Amount: Medium Exudate Type: Serosanguineous Exudate Color: red, brown tures Foul Odor After Cleansing: No Slough/Fibrino Yes Wound Bed Necrotic Amount: Large (67-100%) Exposed Structure Necrotic Quality: Adherent Slough Fascia Exposed: No Fat Layer (Subcutaneous Tissue) Exposed: Yes Tendon Exposed: No Muscle Exposed: No Joint Exposed: No Bone Exposed: No Treatment Notes Wound #14 (Lower Leg) Wound Laterality: Right, Anterior Cleanser Peri-Wound Care Topical Mupirocin Ointment Natasha BassetHENRY, [name] ([id number]) Discharge Instruction: Apply as directed by Anayah Arvanitis. Primary Dressing Xeroform-HBD 2x2 (in/in) Discharge Instruction: Apply Xeroform-HBD 2x2 (in/in) as directed Secondary Dressing ABD Pad 5x9 (in/in) Discharge Instruction:  Cover with ABD pad Secured With Medipore Tape - 88M Medipore H Soft Cloth Surgical Tape, 2x2 (in/yd) Compression Wrap Compression Stockings Add-Ons Electronic Signature(s) Signed: 02/21/2022 4:21:53 PM By: Yevonne PaxEpps, Carrie RN Entered By: Yevonne PaxEpps, Carrie on 02/17/2022 09:26:54 Natasha BassetHENRY, [name] ([id number]) -------------------------------------------------------------------------------- Wound Assessment Details Patient Name: Natasha BassetHENRY, [name] Date of Service: 02/17/2022 8:45 AM Medical Record Number: [id number] Patient Account Number: [id number] Date of Birth/Sex: 02/09/1930 (88 y.o. F) Treating RN: Yevonne PaxEpps, Carrie Primary Care Cruz Devilla: Aram BeechamSparks, Jeffrey Other Clinician: Referring Linet Brash: Arvilla MeresMeyer, Ashley Treating Natasia Sanko/Extender: Tilda FrancoHoffman, Jessica Weeks in Treatment: 0 Wound Status Wound Number: 15 Primary Venous Leg Ulcer Etiology: Wound Location: Right Lower  Leg Wound Open Wounding Event: Trauma Status: Date Acquired: 02/09/2022 Comorbid Cataracts, Glaucoma, Optic Neuritis, Chronic sinus Weeks Of Treatment: 0 History: problems/congestion, Middle ear problems, Hypertension, Clustered Wound: No Rheumatoid Arthritis Photos Wound Measurements Length: (cm) 5 Width: (cm) 3 Depth: (cm) 0.1 Area: (cm) 11.781 Volume: (cm) 1.178 % Reduction in Area: % Reduction in Volume: Epithelialization: None Tunneling: No Undermining: No Wound Description Classification: Full Thickness Without Exposed Support Structu Exudate Amount: Medium Exudate Type: Serosanguineous Exudate Color: red, brown res Foul Odor After Cleansing: No Slough/Fibrino Yes Wound Bed Granulation Amount: Medium (34-66%) Exposed Structure Granulation Quality: Red Fascia Exposed: No Necrotic Amount: Medium (34-66%) Fat Layer (Subcutaneous Tissue) Exposed: Yes Necrotic Quality: Adherent Slough Tendon Exposed: No Muscle Exposed: No Joint Exposed: No Bone Exposed: No Treatment Notes Wound #15 (Lower Leg) Wound Laterality:  Right Cleanser Peri-Wound Care Topical Primary Dressing [name], [name] ([id number]) Hydrofera Blue Ready Transfer Foam, 2.5x2.5 (in/in) Discharge Instruction: Apply Hydrofera Blue Ready to wound bed as directed Secondary Dressing ABD Pad 5x9 (in/in) Discharge Instruction: Cover with ABD pad Secured With Medipore Tape - 60M Medipore H Soft Cloth Surgical Tape, 2x2 (in/yd) Compression Wrap Compression Stockings Add-Ons Electronic Signature(s) Signed: 02/21/2022 4:21:53 PM By: Yevonne Pax RN Entered By: Yevonne Pax on 02/17/2022 40:98:11 [name] ([id number]) -------------------------------------------------------------------------------- Vitals Details Patient Name: [name] Date of Service: 02/17/2022 8:45 AM Medical Record Number: [id number] Patient Account Number: [id number] Date of Birth/Sex: 1930/08/09 (88 y.o. F) Treating RN: Yevonne Pax Primary Care Gala Padovano: Aram Beecham Other Clinician: Referring Sherma Vanmetre: Arvilla Meres Treating Ieesha Abbasi/Extender: Tilda Franco in Treatment: 0 Vital Signs Time Taken: 08:57 Temperature (F): 98.2 Height (in): 60 Pulse (bpm): 76 Source: Stated Respiratory Rate (breaths/min): 18 Weight (lbs): 132 Blood Pressure (mmHg): 150/77 Body Mass Index (BMI): 25.8 Reference Range: 80 - 120 mg / dl Electronic Signature(s) Signed: 02/21/2022 4:21:53 PM By: Yevonne Pax RN Entered By: Yevonne Pax on 02/17/2022 08:58:13

## 2022-02-21 NOTE — Progress Notes (Signed)
[name] ([id number]) ?Visit Report for 02/17/2022 ?Abuse Risk Screen Details ?Patient Name: [name], [name] ?Date of Service: 02/17/2022 8:45 AM ?Medical Record Number: [id number] ?Patient Account Number: [id number] ?Date of Birth/Sex: 1930-03-06 (88 y.o. F) ?Treating RN: Yevonne Pax ?Primary Care Akeya Ryther: Aram Beecham Other Clinician: ?Referring Jerold Yoss: Arvilla Meres ?Treating Jordell Outten/Extender: Geralyn Corwin ?Weeks in Treatment: 0 ?Abuse Risk Screen Items ?Answer ?ABUSE RISK SCREEN: ?Has anyone close to you tried to hurt or harm you recentlyo No ?Do you feel uncomfortable with anyone in your familyo No ?Has anyone forced you do things that you didnot want to doo No ?Electronic Signature(s) ?Signed: 02/21/2022 4:21:53 PM By: Yevonne Pax RN ?Entered By: Yevonne Pax on 02/17/2022 09:01:13 ?NAMIAH, KALLIN ([id number]) ?-------------------------------------------------------------------------------- ?Activities of Daily Living Details ?Patient Name: [name] ?Date of Service: 02/17/2022 8:45 AM ?Medical Record Number: [id number] ?Patient Account Number: [id number] ?Date of Birth/Sex: 07-27-1930 (88 y.o. F) ?Treating RN: Yevonne Pax ?Primary Care Jacobb Alen: Aram Beecham Other Clinician: ?Referring Ernie Kasler: Arvilla Meres ?Treating Richey Doolittle/Extender: Geralyn Corwin ?Weeks in Treatment: 0 ?Activities of Daily Living Items ?Answer ?Activities of Daily Living (Please select one for each item) ?Drive Automobile Not Able ?Take Medications Completely Able ?Use Telephone Completely Able ?Care for Appearance Completely Able ?Use Toilet Completely Able ?Bath / Shower Need Assistance ?Dress Self Completely Able ?Feed Self Completely Able ?Walk Completely Able ?Get In / Out Bed Completely Able ?Housework Need Assistance ?Prepare Meals Need Assistance ?Handle Money Need Assistance ?Shop for Self Need Assistance ?Electronic Signature(s) ?Signed: 02/21/2022 4:21:53 PM By: Yevonne Pax RN ?Entered By: Yevonne Pax  on 02/17/2022 09:02:00 ?KIELEY, WEHRLI ([id number]) ?-------------------------------------------------------------------------------- ?Education Screening Details ?Patient Name: [name] ?Date of Service: 02/17/2022 8:45 AM ?Medical Record Number: [id number] ?Patient Account Number: [id number] ?Date of Birth/Sex: 07/27/1930 (88 y.o. F) ?Treating RN: Yevonne Pax ?Primary Care Abrianna Sidman: Aram Beecham Other Clinician: ?Referring Yosmar Ryker: Arvilla Meres ?Treating Xaden Kaufman/Extender: Geralyn Corwin ?Weeks in Treatment: 0 ?Primary Learner Assessed: Patient ?Learning Preferences/Education Level/Primary Language ?Learning Preference: Explanation ?Highest Education Level: College or Above ?Preferred Language: English ?Cognitive Barrier ?Language Barrier: No ?Translator Needed: No ?Memory Deficit: No ?Emotional Barrier: No ?Cultural/Religious Beliefs Affecting Medical Care: No ?Physical Barrier ?Impaired Vision: Yes Glasses ?Impaired Hearing: No ?Decreased Hand dexterity: No ?Knowledge/Comprehension ?Knowledge Level: Medium ?Comprehension Level: High ?Ability to understand written instructions: High ?Ability to understand verbal instructions: High ?Motivation ?Anxiety Level: Anxious ?Cooperation: Cooperative ?Education Importance: Acknowledges Need ?Interest in Health Problems: Asks Questions ?Perception: Coherent ?Willingness to Engage in Self-Management ?High ?Activities: ?Readiness to Engage in Self-Management ?High ?Activities: ?Electronic Signature(s) ?Signed: 02/21/2022 4:21:53 PM By: Yevonne Pax RN ?Entered By: Yevonne Pax on 02/17/2022 09:02:45 ?EEVA, BRANDT ([id number]) ?-------------------------------------------------------------------------------- ?Fall Risk Assessment Details ?Patient Name: [name] ?Date of Service: 02/17/2022 8:45 AM ?Medical Record Number: [id number] ?Patient Account Number: [id number] ?Date of Birth/Sex: [birth year] (88 y.o. F) ?Treating RN: Yevonne Pax ?Primary Care Jacarri Gesner:  Aram Beecham Other Clinician: ?Referring Shabre Kreher: Arvilla Meres ?Treating Jurgen Groeneveld/Extender: Geralyn Corwin ?Weeks in Treatment: 0 ?Fall Risk Assessment Items ?Have you had 2 or more falls in the last 12 monthso 0 No ?Have you had any fall that resulted in injury in the last 12 monthso 0 No ?FALLS RISK SCREEN ?History of falling - immediate or within 3 months 0 No ?Secondary diagnosis (Do you have 2 or more medical diagnoseso) 0 No ?Ambulatory aid ?None/bed rest/wheelchair/nurse 0 No ?Crutches/cane/walker 0 No ?Furniture 0 No ?Intravenous therapy Access/Saline/Heparin Lock 0 No ?Gait/Transferring ?Normal/ bed rest/ wheelchair 0 No ?Weak (short steps  with or without shuffle, stooped but able to lift head while walking, may ?0 No ?seek support from furniture) ?Impaired (short steps with shuffle, may have difficulty arising from chair, head down, impaired ?0 No ?balance) ?Mental Status ?Oriented to own ability 0 No ?Electronic Signature(s) ?Signed: 02/21/2022 4:21:53 PM By: Yevonne PaxEpps, Carrie RN ?Entered By: Yevonne PaxEpps, Carrie on 02/17/2022 09:02:55 ?Natasha BassetHENRY,  Barnett. ([id number]) ?-------------------------------------------------------------------------------- ?Foot Assessment Details ?Patient Name: Natasha BassetHENRY,  Barnett. ?Date of Service: 02/17/2022 8:45 AM ?Medical Record Number: [id number] ?Patient Account Number: [id number] ?Date of Birth/Sex: 08/13/1930 (88 y.o. F) ?Treating RN: Yevonne PaxEpps, Carrie ?Primary Care Kayleanna Lorman: Aram BeechamSparks, Jeffrey Other Clinician: ?Referring Raynie Steinhaus: Arvilla MeresMeyer, Ashley ?Treating Burr Soffer/Extender: Geralyn CorwinHoffman, Jessica ?Weeks in Treatment: 0 ?Foot Assessment Items ?Site Locations ?+ = Sensation present, - = Sensation absent, C = Callus, U = Ulcer ?R = Redness, W = Warmth, M = Maceration, PU = Pre-ulcerative lesion ?F = Fissure, S = Swelling, D = Dryness ?Assessment ?Right: Left: ?Other Deformity: No No ?Prior Foot Ulcer: No No ?Prior Amputation: No No ?Charcot Joint: No No ?Ambulatory Status: Ambulatory With Help ?Assistance  Device: Walker ?Gait: Unsteady ?Electronic Signature(s) ?Signed: 02/21/2022 4:21:53 PM By: Yevonne PaxEpps, Carrie RN ?Entered By: Yevonne PaxEpps, Carrie on 02/17/2022 09:07:48 ?Natasha BassetHENRY,  Barnett. ([id number]) ?-------------------------------------------------------------------------------- ?Nutrition Risk Screening Details ?Patient Name: Natasha BassetHENRY,  Barnett. ?Date of Service: 02/17/2022 8:45 AM ?Medical Record Number: [id number] ?Patient Account Number: [id number] ?Date of Birth/Sex: 11/10/1930 (88 y.o. F) ?Treating RN: Yevonne PaxEpps, Carrie ?Primary Care Timaya Bojarski: Aram BeechamSparks, Jeffrey Other Clinician: ?Referring Tahisha Hakim: Arvilla MeresMeyer, Ashley ?Treating Glinda Natzke/Extender: Geralyn CorwinHoffman, Jessica ?Weeks in Treatment: 0 ?Height (in): 60 ?Weight (lbs): 132 ?Body Mass Index (BMI): 25.8 ?Nutrition Risk Screening Items ?Score Screening ?NUTRITION RISK SCREEN: ?I have an illness or condition that made me change the kind and/or amount of food I eat 0 No ?I eat fewer than two meals per day 0 No ?I eat few fruits and vegetables, or milk products 0 No ?I have three or more drinks of beer, liquor or wine almost every day 0 No ?I have tooth or mouth problems that make it hard for me to eat 0 No ?I don't always have enough money to buy the food I need 0 No ?I eat alone most of the time 0 No ?I take three or more different prescribed or over-the-counter drugs a day 1 Yes ?Without wanting to, I have lost or gained 10 pounds in the last six months 0 No ?I am not always physically able to shop, cook and/or feed myself 2 Yes ?Nutrition Protocols ?Good Risk Protocol ?Moderate Risk Protocol 0 Provide education on nutrition ?High Risk Proctocol ?Risk Level: Moderate Risk ?Score: 3 ?Electronic Signature(s) ?Signed: 02/21/2022 4:21:53 PM By: Yevonne PaxEpps, Carrie RN ?Entered By: Yevonne PaxEpps, Carrie on 02/17/2022 09:03:53 ?

## 2022-02-24 ENCOUNTER — Other Ambulatory Visit: Payer: Self-pay

## 2022-02-24 ENCOUNTER — Encounter: Payer: Medicare Other | Admitting: Physician Assistant

## 2022-02-24 DIAGNOSIS — S81812A Laceration without foreign body, left lower leg, initial encounter: Secondary | ICD-10-CM | POA: Diagnosis not present
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
## 2022-02-24 NOTE — Progress Notes (Signed)
[name], Railee V. ([id number]) ?Visit Report for 02/24/2022 ?Arrival Information Details ?Patient Name: [name], [name] ?Date of Service: 02/24/2022 3:45 PM ?Medical Record Number: [id number] ?Patient Account Number: [id number] ?Date of Birth/Sex: 02/26/1930 (88 y.o. F) ?Treating RN: Angelina PihGordon, Caitlin ?Primary Care Londyn Wotton: Aram BeechamSparks, Jeffrey Other Clinician: ?Referring Sanaiya Welliver: Aram BeechamSparks, Jeffrey ?Treating Bijal Siglin/Extender: Allen DerryStone, Hoyt ?Weeks in Treatment: 1 ?Visit Information History Since Last Visit ?Added or deleted any medications: No ?Patient Arrived: Wheel Chair ?Any new allergies or adverse reactions: No ?Arrival Time: 15:45 ?Had a fall or experienced change in No ?Accompanied By: family ?activities of daily living that may affect ?Transfer Assistance: EasyPivot Patient ?risk of falls: ?Lift ?Hospitalized since last visit: No ?Patient Identification Verified: Yes ?Has Dressing in Place as Prescribed: Yes ?Secondary Verification Process Completed: Yes ?Pain Present Now: Yes ?Patient Requires Transmission-Based No ?Precautions: ?Patient Has Alerts: No ?Electronic Signature(s) ?Signed: 02/24/2022 4:54:52 PM By: Angelina PihGordon, Caitlin ?Entered By: Angelina PihGordon, Caitlin on 02/24/2022 15:46:18 ?[name], [name] ([id number]) ?-------------------------------------------------------------------------------- ?Clinic Level of Care Assessment Details ?Patient Name: [name], [name] ?Date of Service: 02/24/2022 3:45 PM ?Medical Record Number: [id number] ?Patient Account Number: [id number] ?Date of Birth/Sex: 11/15/1930 (88 y.o. F) ?Treating RN: Angelina PihGordon, Caitlin ?Primary Care Delron Comer: Aram BeechamSparks, Jeffrey Other Clinician: ?Referring Trever Streater: Aram BeechamSparks, Jeffrey ?Treating Haakon Titsworth/Extender: Allen DerryStone, Hoyt ?Weeks in Treatment: 1 ?Clinic Level of Care Assessment Items ?TOOL 4 Quantity Score ?[]  - Use when only an EandM is performed on FOLLOW-UP visit 0 ?ASSESSMENTS - Nursing Assessment / Reassessment ?[]  - Reassessment of Co-morbidities (includes updates in patient  status) 0 ?X- 1 5 ?Reassessment of Adherence to Treatment Plan ?ASSESSMENTS - Wound and Skin Assessment / Reassessment ?[]  - Simple Wound Assessment / Reassessment - one wound 0 ?X- 3 5 ?Complex Wound Assessment / Reassessment - multiple wounds ?[]  - 0 ?Dermatologic / Skin Assessment (not related to wound area) ?ASSESSMENTS - Focused Assessment ?X - Circumferential Edema Measurements - multi extremities 1 5 ?[]  - 0 ?Nutritional Assessment / Counseling / Intervention ?[]  - 0 ?Lower Extremity Assessment (monofilament, tuning fork, pulses) ?[]  - 0 ?Peripheral Arterial Disease Assessment (using hand held doppler) ?ASSESSMENTS - Ostomy and/or Continence Assessment and Care ?[]  - Incontinence Assessment and Management 0 ?[]  - 0 ?Ostomy Care Assessment and Management (repouching, etc.) ?PROCESS - Coordination of Care ?X - Simple Patient / Family Education for ongoing care 1 15 ?[]  - 0 ?Complex (extensive) Patient / Family Education for ongoing care ?[]  - 0 ?Staff obtains Consents, Records, Test Results / Process Orders ?[]  - 0 ?Staff telephones HHA, Nursing Homes / Clarify orders / etc ?[]  - 0 ?Routine Transfer to another Facility (non-emergent condition) ?[]  - 0 ?Routine Hospital Admission (non-emergent condition) ?[]  - 0 ?New Admissions / [employer] Authorizations / Ordering NPWT, Apligraf, etc. ?[]  - 0 ?Emergency Hospital Admission (emergent condition) ?X- 1 10 ?Simple Discharge Coordination ?[]  - 0 ?Complex (extensive) Discharge Coordination ?PROCESS - Special Needs ?[]  - Pediatric / Minor Patient Management 0 ?[]  - 0 ?Isolation Patient Management ?[]  - 0 ?Hearing / Language / Visual special needs ?[]  - 0 ?Assessment of Community assistance (transportation, D/C planning, etc.) ?[]  - 0 ?Additional assistance / Altered mentation ?[]  - 0 ?Support Surface(s) Assessment (bed, cushion, seat, etc.) ?INTERVENTIONS - Wound Cleansing / Measurement ?[name], Jorgina V. ([id number]) ?[]  - 0 ?Simple Wound Cleansing - one wound ?X- 3  5 ?Complex Wound Cleansing - multiple wounds ?X- 1 5 ?Wound Imaging (photographs - any number of wounds) ?[]  - 0 ?Wound Tracing (instead of photographs) ?[]  - 0 ?Simple Wound Measurement -  one wound ?X- 3 5 ?Complex Wound Measurement - multiple wounds ?INTERVENTIONS - Wound Dressings ?X - Small Wound Dressing one or multiple wounds 3 10 ?  - 0 ?Medium Wound Dressing one or multiple wounds ?  - 0 ?Large Wound Dressing one or multiple wounds ?  - 0 ?Application of Medications - topical ?  - 0 ?Application of Medications - injection ?INTERVENTIONS - Miscellaneous ?  - External ear exam 0 ?  - 0 ?Specimen Collection (cultures, biopsies, blood, body fluids, etc.) ?  - 0 ?Specimen(s) / Culture(s) sent or taken to Lab for analysis ?  - 0 ?Patient Transfer (multiple staff / Nurse, adult / Similar devices) ?  - 0 ?Simple Staple / Suture removal (25 or less) ?  - 0 ?Complex Staple / Suture removal (26 or more) ?  - 0 ?Hypo / Hyperglycemic Management (close monitor of Blood Glucose) ?  - 0 ?Ankle / Brachial Index (ABI) - do not check if billed separately ?X- 1 5 ?Vital Signs ?Has the patient been seen at the hospital within the last three years: Yes ?Total Score: 120 ?Level Of Care: New/Established - Level ?4 ?Electronic Signature(s) ?Signed: 02/24/2022 4:54:52 PM By: Angelina Pih ?Entered By: Angelina Pih on 02/24/2022 16:52:40 ?JERSIE, BEEL ([id number]) ?-------------------------------------------------------------------------------- ?Encounter Discharge Information Details ?Patient Name: [name] ?Date of Service: 02/24/2022 3:45 PM ?Medical Record Number: [id number] ?Patient Account Number: [id number] ?Date of Birth/Sex: September 23, 1930 (88 y.o. F) ?Treating RN: Angelina Pih ?Primary Care Deyanira Fesler: Aram Beecham Other Clinician: ?Referring Lynnda Wiersma: Aram Beecham ?Treating Erich Kochan/Extender: Allen Derry ?Weeks in Treatment: 1 ?Encounter Discharge Information Items ?Discharge Condition:  Stable ?Ambulatory Status: Wheelchair ?Discharge Destination: Home ?Transportation: Private Auto ?Accompanied By: daughter in law ?Schedule Follow-up Appointment: Yes ?Clinical Summary of Care: ?Electronic Signature(s) ?Signed: 02/24/2022 4:54:52 PM By: Angelina Pih ?Entered By: Angelina Pih on 02/24/2022 16:54:34 ?ONICA, DAVIDOVICH ([id number]) ?-------------------------------------------------------------------------------- ?Lower Extremity Assessment Details ?Patient Name: [name] ?Date of Service: 02/24/2022 3:45 PM ?Medical Record Number: [id number] ?Patient Account Number: [id number] ?Date of Birth/Sex: 08-07-1930 (88 y.o. F) ?Treating RN: Angelina Pih ?Primary Care Yoltzin Barg: Aram Beecham Other Clinician: ?Referring Virginia Curl: Aram Beecham ?Treating Dyamond Tolosa/Extender: Allen Derry ?Weeks in Treatment: 1 ?Edema Assessment ?Assessed: [Left: No] [Right: No] ?Edema: [Left: Yes] [Right: Yes] ?Calf ?Left: Right: ?Point of Measurement: 32 cm From Medial Instep 34.5 cm 35 cm ?Ankle ?Left: Right: ?Point of Measurement: 10 cm From Medial Instep 22.1 cm 21.7 cm ?Vascular Assessment ?Pulses: ?Dorsalis Pedis ?Palpable: [Left:Yes] [Right:Yes] ?Posterior Tibial ?Palpable: [Left:Yes] [Right:Yes] ?Electronic Signature(s) ?Signed: 02/24/2022 4:54:52 PM By: Angelina Pih ?Entered By: Angelina Pih on 02/24/2022 16:12:52 ?BEBE, MONCURE ([id number]) ?-------------------------------------------------------------------------------- ?Multi Wound Chart Details ?Patient Name: [name] ?Date of Service: 02/24/2022 3:45 PM ?Medical Record Number: [id number] ?Patient Account Number: [id number] ?Date of Birth/Sex: 09-05-1930 (88 y.o. F) ?Treating RN: Angelina Pih ?Primary Care Taheem Fricke: Aram Beecham Other Clinician: ?Referring Troi Florendo: Aram Beecham ?Treating Murray Guzzetta/Extender: Allen Derry ?Weeks in Treatment: 1 ?Vital Signs ?Height(in): 60 ?Pulse(bpm): 68 ?Weight(lbs): 132 ?Blood Pressure(mmHg):  164/82 ?Body Mass Index(BMI): 25.8 ?Temperature(??F): 97.7 ?Respiratory Rate(breaths/min): 18 ?Photos: ?Wound Location: Left Lower Leg Right, Anterior Lower Leg Right Lower Leg ?Wounding Event: Trauma Trauma Trauma ?Primary E

## 2022-03-03 ENCOUNTER — Encounter: Payer: Medicare Other | Admitting: Physician Assistant

## 2022-03-04 ENCOUNTER — Encounter: Payer: Medicare Other | Admitting: Physician Assistant

## 2022-03-04 ENCOUNTER — Other Ambulatory Visit: Payer: Self-pay

## 2022-03-04 DIAGNOSIS — S81812A Laceration without foreign body, left lower leg, initial encounter: Secondary | ICD-10-CM | POA: Diagnosis not present
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
## 2022-03-08 NOTE — Progress Notes (Signed)
[name], Ardel V. ([id number]) ?Visit Report for 03/04/2022 ?Arrival Information Details ?Patient Name: [name], [name] ?Date of Service: 03/04/2022 11:00 AM ?Medical Record Number: [id number] ?Patient Account Number: [id number] ?Date of Birth/Sex: 03/07/1930 (88 y.o. F) ?Treating RN: Yevonne PaxEpps, Carrie ?Primary Care Deshawn Skelley: Aram BeechamSparks, Jeffrey Other Clinician: ?Referring Moiz Ryant: Aram BeechamSparks, Jeffrey ?Treating Geoff Dacanay/Extender: Allen DerryStone, Hoyt ?Weeks in Treatment: 2 ?Visit Information History Since Last Visit ?All ordered tests and consults were completed: No ?Patient Arrived: Wheel Chair ?Added or deleted any medications: No ?Arrival Time: 11:00 ?Any new allergies or adverse reactions: No ?Accompanied By: son ?Had a fall or experienced change in No ?Transfer Assistance: None ?activities of daily living that may affect ?Patient Identification Verified: Yes ?risk of falls: ?Secondary Verification Process Completed: Yes ?Signs or symptoms of abuse/neglect since last visito No ?Patient Requires Transmission-Based Precautions: No ?Hospitalized since last visit: No ?Patient Has Alerts: No ?Implantable device outside of the clinic excluding No ?cellular tissue based products placed in the center ?since last visit: ?Has Dressing in Place as Prescribed: Yes ?Has Compression in Place as Prescribed: Yes ?Pain Present Now: No ?Electronic Signature(s) ?Signed: 03/08/2022 4:01:37 PM By: Yevonne PaxEpps, Carrie RN ?Entered By: Yevonne PaxEpps, Carrie on 03/04/2022 11:06:11 ?[name], Yaniyah V. ([id number]) ?-------------------------------------------------------------------------------- ?Clinic Level of Care Assessment Details ?Patient Name: [name], [name] ?Date of Service: 03/04/2022 11:00 AM ?Medical Record Number: [id number] ?Patient Account Number: [id number] ?Date of Birth/Sex: 11/16/1930 (88 y.o. F) ?Treating RN: Yevonne PaxEpps, Carrie ?Primary Care Joshwa Hemric: Aram BeechamSparks, Jeffrey Other Clinician: ?Referring Terril Amaro: Aram BeechamSparks, Jeffrey ?Treating Jonasia Coiner/Extender: Allen DerryStone, Hoyt ?Weeks in  Treatment: 2 ?Clinic Level of Care Assessment Items ?TOOL 4 Quantity Score ?X - Use when only an EandM is performed on FOLLOW-UP visit 1 0 ?ASSESSMENTS - Nursing Assessment / Reassessment ?X - Reassessment of Co-morbidities (includes updates in patient status) 1 10 ?X- 1 5 ?Reassessment of Adherence to Treatment Plan ?ASSESSMENTS - Wound and Skin Assessment / Reassessment ?[]  - Simple Wound Assessment / Reassessment - one wound 0 ?X- 3 5 ?Complex Wound Assessment / Reassessment - multiple wounds ?[]  - 0 ?Dermatologic / Skin Assessment (not related to wound area) ?ASSESSMENTS - Focused Assessment ?[]  - Circumferential Edema Measurements - multi extremities 0 ?[]  - 0 ?Nutritional Assessment / Counseling / Intervention ?[]  - 0 ?Lower Extremity Assessment (monofilament, tuning fork, pulses) ?[]  - 0 ?Peripheral Arterial Disease Assessment (using hand held doppler) ?ASSESSMENTS - Ostomy and/or Continence Assessment and Care ?[]  - Incontinence Assessment and Management 0 ?[]  - 0 ?Ostomy Care Assessment and Management (repouching, etc.) ?PROCESS - Coordination of Care ?X - Simple Patient / Family Education for ongoing care 1 15 ?[]  - 0 ?Complex (extensive) Patient / Family Education for ongoing care ?[]  - 0 ?Staff obtains Consents, Records, Test Results / Process Orders ?[]  - 0 ?Staff telephones HHA, Nursing Homes / Clarify orders / etc ?[]  - 0 ?Routine Transfer to another Facility (non-emergent condition) ?[]  - 0 ?Routine Hospital Admission (non-emergent condition) ?[]  - 0 ?New Admissions / Manufacturing engineernsurance Authorizations / Ordering NPWT, Apligraf, etc. ?[]  - 0 ?Emergency Hospital Admission (emergent condition) ?X- 1 10 ?Simple Discharge Coordination ?[]  - 0 ?Complex (extensive) Discharge Coordination ?PROCESS - Special Needs ?[]  - Pediatric / Minor Patient Management 0 ?[]  - 0 ?Isolation Patient Management ?[]  - 0 ?Hearing / Language / Visual special needs ?[]  - 0 ?Assessment of Community assistance (transportation, D/C  planning, etc.) ?[]  - 0 ?Additional assistance / Altered mentation ?[]  - 0 ?Support Surface(s) Assessment (bed, cushion, seat, etc.) ?INTERVENTIONS - Wound Cleansing / Measurement ?[name], [name] ([id number]) ?[]  -  0 ?Simple Wound Cleansing - one wound ?X- 3 5 ?Complex Wound Cleansing - multiple wounds ?X- 1 5 ?Wound Imaging (photographs - any number of wounds) ?  - 0 ?Wound Tracing (instead of photographs) ?  - 0 ?Simple Wound Measurement - one wound ?X- 3 5 ?Complex Wound Measurement - multiple wounds ?INTERVENTIONS - Wound Dressings ?X - Small Wound Dressing one or multiple wounds 3 10 ?  - 0 ?Medium Wound Dressing one or multiple wounds ?  - 0 ?Large Wound Dressing one or multiple wounds ?  - 0 ?Application of Medications - topical ?  - 0 ?Application of Medications - injection ?INTERVENTIONS - Miscellaneous ?  - External ear exam 0 ?  - 0 ?Specimen Collection (cultures, biopsies, blood, body fluids, etc.) ?  - 0 ?Specimen(s) / Culture(s) sent or taken to Lab for analysis ?  - 0 ?Patient Transfer (multiple staff / Nurse, adult / Similar devices) ?  - 0 ?Simple Staple / Suture removal (25 or less) ?  - 0 ?Complex Staple / Suture removal (26 or more) ?  - 0 ?Hypo / Hyperglycemic Management (close monitor of Blood Glucose) ?  - 0 ?Ankle / Brachial Index (ABI) - do not check if billed separately ?X- 1 5 ?Vital Signs ?Has the patient been seen at the hospital within the last three years: Yes ?Total Score: 125 ?Level Of Care: New/Established - Level ?4 ?Electronic Signature(s) ?Signed: 03/08/2022 4:01:37 PM By: Yevonne Pax RN ?Entered By: Yevonne Pax on 03/04/2022 11:31:32 ?DAVEN, PINCKNEY ([id number]) ?-------------------------------------------------------------------------------- ?Encounter Discharge Information Details ?Patient Name: AISLING, EMIGH ?Date of Service: 03/04/2022 11:00 AM ?Medical Record Number: [id number] ?Patient Account Number: [id number] ?Date of Birth/Sex: 25-Mar-1930 (88 y.o.  F) ?Treating RN: Yevonne Pax ?Primary Care Jonathin Heinicke: Aram Beecham Other Clinician: ?Referring Kajah Santizo: Aram Beecham ?Treating Mailey Landstrom/Extender: Allen Derry ?Weeks in Treatment: 2 ?Encounter Discharge Information Items ?Discharge Condition: Stable ?Ambulatory Status: Ambulatory ?Discharge Destination: Home ?Transportation: Private Auto ?Accompanied By: son ?Schedule Follow-up Appointment: Yes ?Clinical Summary of Care: Patient Declined ?Electronic Signature(s) ?Signed: 03/08/2022 4:01:37 PM By: Yevonne Pax RN ?Entered By: Yevonne Pax on 03/04/2022 12:34:26 ?ILLEANA, EDICK ([id number]) ?-------------------------------------------------------------------------------- ?Lower Extremity Assessment Details ?Patient Name: AYVEN, GLASCO ?Date of Service: 03/04/2022 11:00 AM ?Medical Record Number: [id number] ?Patient Account Number: [id number] ?Date of Birth/Sex: [birth year] (88 y.o. F) ?Treating RN: Yevonne Pax ?Primary Care Darrick Greenlaw: Aram Beecham Other Clinician: ?Referring Melanee Cordial: Aram Beecham ?Treating Keri Tavella/Extender: Allen Derry ?Weeks in Treatment: 2 ?Edema Assessment ?Assessed: [Left: No] [Right: No] ?[Left: Edema] [Right: :] ?Calf ?Left: Right: ?Point of Measurement: 32 cm From Medial Instep 34 cm 35 cm ?Ankle ?Left: Right: ?Point of Measurement: 10 cm From Medial Instep 22 cm 21 cm ?Vascular Assessment ?Pulses: ?Dorsalis Pedis ?Palpable: [Left:Yes] [Right:Yes] ?Electronic Signature(s) ?Signed: 03/08/2022 4:01:37 PM By: Yevonne Pax RN ?Entered By: Yevonne Pax on 03/04/2022 11:17:58 ?GLORIANNA, GOTT ([id number]) ?-------------------------------------------------------------------------------- ?Multi Wound Chart Details ?Patient Name: ROSE, HEGNER ?Date of Service: 03/04/2022 11:00 AM ?Medical Record Number: [id number] ?Patient Account Number: [id number] ?Date of Birth/Sex: 11-06-1930 (88 y.o. F) ?Treating RN: Yevonne Pax ?Primary Care Henessy Rohrer: Aram Beecham Other Clinician: ?Referring  Tommaso Cavitt: Aram Beecham ?Treating Carles Florea/Extender: Allen Derry ?Weeks in Treatment: 2 ?Vital Signs ?Height(in): 60 ?Pulse(bpm): 75 ?Weight(lbs): 132 ?Blood Pressure(mmHg): 139/72 ?Body Mass Index(BMI): 25.8 ?Temperature(?

## 2022-03-10 ENCOUNTER — Encounter: Payer: Medicare Other | Admitting: Physician Assistant

## 2022-03-10 DIAGNOSIS — S81812A Laceration without foreign body, left lower leg, initial encounter: Secondary | ICD-10-CM | POA: Diagnosis not present

## 2022-03-10 NOTE — Progress Notes (Addendum)
[name], [name] ([id number]) ?Visit Report for 03/10/2022 ?Arrival Information Details ?Patient Name: [name], [name] ?Date of Service: 03/10/2022 2:00 PM ?Medical Record Number: [id number] ?Patient Account Number: [id number] ?Date of Birth/Sex: 1930/02/11 (88 y.o. F) ?Treating RN: Carlene Coria ?Primary Care Nayelis Bonito: Fulton Reek Other Clinician: ?Referring Deloy Archey: Fulton Reek ?Treating Aviel Davalos/Extender: Jeri Cos ?Weeks in Treatment: 3 ?Visit Information History Since Last Visit ?All ordered tests and consults were completed: No ?Patient Arrived: Wheel Chair ?Added or deleted any medications: No ?Arrival Time: 13:58 ?Any new allergies or adverse reactions: No ?Accompanied By: caregiver ?Had a fall or experienced change in No ?Transfer Assistance: None ?activities of daily living that may affect ?Patient Identification Verified: Yes ?risk of falls: ?Secondary Verification Process Completed: Yes ?Signs or symptoms of abuse/neglect since last visito No ?Patient Requires Transmission-Based Precautions: No ?Hospitalized since last visit: No ?Patient Has Alerts: No ?Implantable device outside of the clinic excluding No ?cellular tissue based products placed in the center ?since last visit: ?Has Dressing in Place as Prescribed: Yes ?Has Compression in Place as Prescribed: Yes ?Pain Present Now: No ?Electronic Signature(s) ?Signed: 03/11/2022 4:01:15 PM By: Carlene Coria RN ?Entered By: Carlene Coria on 03/10/2022 14:00:59 ?[name], [name] ([id number]) ?-------------------------------------------------------------------------------- ?Clinic Level of Care Assessment Details ?Patient Name: [name], [name] ?Date of Service: 03/10/2022 2:00 PM ?Medical Record Number: [id number] ?Patient Account Number: [id number] ?Date of Birth/Sex: 1930-10-21 (88 y.o. F) ?Treating RN: Carlene Coria ?Primary Care Dajane Valli: Fulton Reek Other Clinician: ?Referring Carrington Olazabal: Fulton Reek ?Treating Monty Spicher/Extender: Jeri Cos ?Weeks in  Treatment: 3 ?Clinic Level of Care Assessment Items ?TOOL 4 Quantity Score ?X - Use when only an EandM is performed on FOLLOW-UP visit 1 0 ?ASSESSMENTS - Nursing Assessment / Reassessment ?X - Reassessment of Co-morbidities (includes updates in patient status) 1 10 ?X- 1 5 ?Reassessment of Adherence to Treatment Plan ?ASSESSMENTS - Wound and Skin Assessment / Reassessment ?[]  - Simple Wound Assessment / Reassessment - one wound 0 ?X- 3 5 ?Complex Wound Assessment / Reassessment - multiple wounds ?[]  - 0 ?Dermatologic / Skin Assessment (not related to wound area) ?ASSESSMENTS - Focused Assessment ?[]  - Circumferential Edema Measurements - multi extremities 0 ?[]  - 0 ?Nutritional Assessment / Counseling / Intervention ?[]  - 0 ?Lower Extremity Assessment (monofilament, tuning fork, pulses) ?[]  - 0 ?Peripheral Arterial Disease Assessment (using hand held doppler) ?ASSESSMENTS - Ostomy and/or Continence Assessment and Care ?[]  - Incontinence Assessment and Management 0 ?[]  - 0 ?Ostomy Care Assessment and Management (repouching, etc.) ?PROCESS - Coordination of Care ?X - Simple Patient / Family Education for ongoing care 1 15 ?[]  - 0 ?Complex (extensive) Patient / Family Education for ongoing care ?[]  - 0 ?Staff obtains Consents, Records, Test Results / Process Orders ?[]  - 0 ?Staff telephones HHA, Nursing Homes / Clarify orders / etc ?[]  - 0 ?Routine Transfer to another Facility (non-emergent condition) ?[]  - 0 ?Routine Hospital Admission (non-emergent condition) ?[]  - 0 ?New Admissions / Biomedical engineer / Ordering NPWT, Apligraf, etc. ?[]  - 0 ?Emergency Hospital Admission (emergent condition) ?X- 1 10 ?Simple Discharge Coordination ?[]  - 0 ?Complex (extensive) Discharge Coordination ?PROCESS - Special Needs ?[]  - Pediatric / Minor Patient Management 0 ?[]  - 0 ?Isolation Patient Management ?[]  - 0 ?Hearing / Language / Visual special needs ?[]  - 0 ?Assessment of Community assistance (transportation, D/C  planning, etc.) ?[]  - 0 ?Additional assistance / Altered mentation ?[]  - 0 ?Support Surface(s) Assessment (bed, cushion, seat, etc.) ?INTERVENTIONS - Wound Cleansing / Measurement ?[name], [name] ([id number]) ?[]  -  0 ?Simple Wound Cleansing - one wound ?X- 3 5 ?Complex Wound Cleansing - multiple wounds ?X- 1 5 ?Wound Imaging (photographs - any number of wounds) ?[]  - 0 ?Wound Tracing (instead of photographs) ?[]  - 0 ?Simple Wound Measurement - one wound ?X- 3 5 ?Complex Wound Measurement - multiple wounds ?INTERVENTIONS - Wound Dressings ?X - Small Wound Dressing one or multiple wounds 3 10 ?[]  - 0 ?Medium Wound Dressing one or multiple wounds ?[]  - 0 ?Large Wound Dressing one or multiple wounds ?[]  - 0 ?Application of Medications - topical ?[]  - 0 ?Application of Medications - injection ?INTERVENTIONS - Miscellaneous ?[]  - External ear exam 0 ?[]  - 0 ?Specimen Collection (cultures, biopsies, blood, body fluids, etc.) ?[]  - 0 ?Specimen(s) / Culture(s) sent or taken to Lab for analysis ?[]  - 0 ?Patient Transfer (multiple staff / [employer] fast streamer / Similar devices) ?[]  - 0 ?Simple Staple / Suture removal (25 or less) ?[]  - 0 ?Complex Staple / Suture removal (26 or more) ?[]  - 0 ?Hypo / Hyperglycemic Management (close monitor of Blood Glucose) ?[]  - 0 ?Ankle / Brachial Index (ABI) - do not check if billed separately ?X- 1 5 ?Vital Signs ?Has the patient been seen at the hospital within the last three years: Yes ?Total Score: 125 ?Level Of Care: New/Established - Level ?4 ?Electronic Signature(s) ?Signed: 03/11/2022 4:01:15 PM By: Carlene Coria RN ?Entered By: Carlene Coria on 03/10/2022 14:36:34 ?[name], [name] ([id number]) ?-------------------------------------------------------------------------------- ?Encounter Discharge Information Details ?Patient Name: [name], [name] ?Date of Service: 03/10/2022 2:00 PM ?Medical Record Number: [id number] ?Patient Account Number: [id number] ?Date of Birth/Sex: [birth year] (88 y.o.  F) ?Treating RN: Carlene Coria ?Primary Care Kaelob Persky: Fulton Reek Other Clinician: ?Referring Mieko Kneebone: Fulton Reek ?Treating Sonika Levins/Extender: Jeri Cos ?Weeks in Treatment: 3 ?Encounter Discharge Information Items ?Discharge Condition: Stable ?Ambulatory Status: Wheelchair ?Discharge Destination: Home ?Transportation: Private Auto ?Accompanied By: caregiver ?Schedule Follow-up Appointment: Yes ?Clinical Summary of Care: Patient Declined ?Electronic Signature(s) ?Signed: 03/11/2022 4:01:15 PM By: Carlene Coria RN ?Entered By: Carlene Coria on 03/10/2022 14:37:46 ?[name], [name] ([id number]) ?-------------------------------------------------------------------------------- ?Lower Extremity Assessment Details ?Patient Name: [name], [name] ?Date of Service: 03/10/2022 2:00 PM ?Medical Record Number: [id number] ?Patient Account Number: [id number] ?Date of Birth/Sex: 08-19-1930 (88 y.o. F) ?Treating RN: Carlene Coria ?Primary Care Lolamae Voisin: Fulton Reek Other Clinician: ?Referring Jaykub Mackins: Fulton Reek ?Treating Ahmet Schank/Extender: Jeri Cos ?Weeks in Treatment: 3 ?Edema Assessment ?Assessed: [Left: No] [Right: No] ?Edema: [Left: Yes] [Right: Yes] ?Calf ?Left: Right: ?Point of Measurement: 32 cm From Medial Instep 34 cm 35 cm ?Ankle ?Left: Right: ?Point of Measurement: 10 cm From Medial Instep 22 cm 21 cm ?Electronic Signature(s) ?Signed: 03/11/2022 4:01:15 PM By: Carlene Coria RN ?Entered By: Carlene Coria on 03/10/2022 14:14:30 ?[name], [name] ([id number]) ?-------------------------------------------------------------------------------- ?Multi Wound Chart Details ?Patient Name: [name], [name] ?Date of Service: 03/10/2022 2:00 PM ?Medical Record Number: [id number] ?Patient Account Number: [id number] ?Date of Birth/Sex: 09-17-1930 (88 y.o. F) ?Treating RN: Carlene Coria ?Primary Care Kinisha Soper: Fulton Reek Other Clinician: ?Referring Addilyne Backs: Fulton Reek ?Treating Tayler Lassen/Extender: Jeri Cos ?Weeks in  Treatment: 3 ?Vital Signs ?Height(in): 60 ?Pulse(bpm): 68 ?Weight(lbs): 132 ?Blood Pressure(mmHg): 130/56 ?Body Mass Index(BMI): 25.8 ?Temperature(??F): 97.7 ?Respiratory Rate(breaths/min): 18 ?Photos: ?Wound Locat
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
## 2022-03-21 ENCOUNTER — Encounter: Payer: Medicare Other | Attending: Physician Assistant | Admitting: Physician Assistant

## 2022-03-21 DIAGNOSIS — I10 Essential (primary) hypertension: Secondary | ICD-10-CM | POA: Insufficient documentation

## 2022-03-21 DIAGNOSIS — X58XXXA Exposure to other specified factors, initial encounter: Secondary | ICD-10-CM | POA: Diagnosis not present

## 2022-03-21 DIAGNOSIS — L97822 Non-pressure chronic ulcer of other part of left lower leg with fat layer exposed: Secondary | ICD-10-CM | POA: Diagnosis not present

## 2022-03-21 DIAGNOSIS — L97812 Non-pressure chronic ulcer of other part of right lower leg with fat layer exposed: Secondary | ICD-10-CM | POA: Diagnosis not present

## 2022-03-21 DIAGNOSIS — I872 Venous insufficiency (chronic) (peripheral): Secondary | ICD-10-CM | POA: Insufficient documentation

## 2022-03-21 DIAGNOSIS — S81811A Laceration without foreign body, right lower leg, initial encounter: Secondary | ICD-10-CM | POA: Diagnosis not present

## 2022-03-21 DIAGNOSIS — M069 Rheumatoid arthritis, unspecified: Secondary | ICD-10-CM | POA: Insufficient documentation

## 2022-03-21 DIAGNOSIS — J449 Chronic obstructive pulmonary disease, unspecified: Secondary | ICD-10-CM | POA: Insufficient documentation

## 2022-03-21 DIAGNOSIS — S81812A Laceration without foreign body, left lower leg, initial encounter: Secondary | ICD-10-CM | POA: Diagnosis present
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
## 2022-03-21 NOTE — Progress Notes (Addendum)
Natasha, Barnett ([id number]) ?Visit Report for 03/21/2022 ?Arrival Information Details ?Patient Name: [name], [name] ?Date of Service: 03/21/2022 11:30 AM ?Medical Record Number: [id number] ?Patient Account Number: [id number] ?Date of Birth/Sex: 04-15-1930 (88 y.o. F) ?Treating RN: Donnamarie Poag ?Primary Care Eris Hannan: Fulton Reek Other Clinician: ?Referring Delight Bickle: Fulton Reek ?Treating Alyannah Sanks/Extender: Jeri Cos ?Weeks in Treatment: 4 ?Visit Information History Since Last Visit ?Added or deleted any medications: No ?Patient Arrived: Wheel Chair ?Had a fall or experienced change in No ?Arrival Time: 11:40 ?activities of daily living that may affect ?Accompanied By: son ?risk of falls: ?Transfer Assistance: Manual ?Hospitalized since last visit: No ?Patient Identification Verified: Yes ?Has Dressing in Place as Prescribed: Yes ?Secondary Verification Process Completed: Yes ?Pain Present Now: Yes ?Patient Requires Transmission-Based Precautions: No ?Patient Has Alerts: Yes ?Patient Alerts: NOT diabetic ?Electronic Signature(s) ?Signed: 03/21/2022 3:34:42 PM By: Donnamarie Poag ?Entered ByDonnamarie Poag on 03/21/2022 11:44:19 ?VENEDA, KIRKSEY ([id number]) ?-------------------------------------------------------------------------------- ?Encounter Discharge Information Details ?Patient Name: Natasha, Barnett ?Date of Service: 03/21/2022 11:30 AM ?Medical Record Number: [id number] ?Patient Account Number: [id number] ?Date of Birth/Sex: July 07, 1930 (88 y.o. F) ?Treating RN: Donnamarie Poag ?Primary Care Takyla Kuchera: Fulton Reek Other Clinician: ?Referring Cashis Rill: Fulton Reek ?Treating Lashawnda Hancox/Extender: Jeri Cos ?Weeks in Treatment: 4 ?Encounter Discharge Information Items Post Procedure Vitals ?Discharge Condition: Stable ?Temperature (?F): 97.9 ?Ambulatory Status: Wheelchair ?Pulse (bpm): 78 ?Discharge Destination: Home ?Respiratory Rate (breaths/min): 16 ?Transportation: Private Auto ?Blood Pressure (mmHg):  115/81 ?Accompanied By: son ?Schedule Follow-up Appointment: Yes ?Clinical Summary of Care: ?Electronic Signature(s) ?Signed: 03/21/2022 3:34:42 PM By: Donnamarie Poag ?Entered ByDonnamarie Poag on 03/21/2022 12:22:10 ?NORISSA, BARTEE ([id number]) ?-------------------------------------------------------------------------------- ?Lower Extremity Assessment Details ?Patient Name: Natasha, Barnett ?Date of Service: 03/21/2022 11:30 AM ?Medical Record Number: [id number] ?Patient Account Number: [id number] ?Date of Birth/Sex: 1930-02-28 (88 y.o. F) ?Treating RN: Donnamarie Poag ?Primary Care Harper Smoker: Fulton Reek Other Clinician: ?Referring Antolin Belsito: Fulton Reek ?Treating Brown Dunlap/Extender: Jeri Cos ?Weeks in Treatment: 4 ?Edema Assessment ?Assessed: [Left: Yes] [Right: Yes] ?[Left: Edema] [Right: :] ?Calf ?Left: Right: ?Point of Measurement: 32 cm From Medial Instep 33.5 cm 33.5 cm ?Ankle ?Left: Right: ?Point of Measurement: 10 cm From Medial Instep 23 cm 22.5 cm ?Electronic Signature(s) ?Signed: 03/21/2022 3:34:42 PM By: Donnamarie Poag ?Entered ByDonnamarie Poag on 03/21/2022 11:54:30 ?[name], [name] ([id number]) ?-------------------------------------------------------------------------------- ?Multi Wound Chart Details ?Patient Name: Natasha, Barnett ?Date of Service: 03/21/2022 11:30 AM ?Medical Record Number: [id number] ?Patient Account Number: [id number] ?Date of Birth/Sex: 05-06-1930 (88 y.o. F) ?Treating RN: Donnamarie Poag ?Primary Care Chrishaun Sasso: Fulton Reek Other Clinician: ?Referring Gen Clagg: Fulton Reek ?Treating Arihana Ambrocio/Extender: Jeri Cos ?Weeks in Treatment: 4 ?Vital Signs ?Height(in): 60 ?Pulse(bpm): 75 ?Weight(lbs): 132 ?Blood Pressure(mmHg): 115/81 ?Body Mass Index(BMI): 25.8 ?Temperature(??F): 97.39 ?Respiratory Rate(breaths/min): 16 ?Photos: ?Wound Location: Left Lower Leg Right, Anterior Lower Leg Right Lower Leg ?Wounding Event: Trauma Trauma Trauma ?Primary Etiology: Skin Tear Venous Leg Ulcer Venous  Leg Ulcer ?Comorbid History: Cataracts, Glaucoma, Optic Cataracts, Glaucoma, Optic Cataracts, Glaucoma, Optic ?Neuritis, Chronic sinus Neuritis, Chronic sinus Neuritis, Chronic sinus ?problems/congestion, Middle ear problems/congestion, Middle ear problems/congestion, Middle ear ?problems, Hypertension, problems, Hypertension, problems, Hypertension, ?Rheumatoid Arthritis Rheumatoid Arthritis Rheumatoid Arthritis ?Date Acquired: 02/09/2022 02/09/2022 02/09/2022 ?Weeks of Treatment: _0 ?Wound Status: Open Healed - Epithelialized Open ?Wound Recurrence: No No No ?Measurements L x W x D (cm) 0.5x0.5x0.1 0x0x0 3x1.7x0.1 ?Area (cm?) : 0.196 0 4.006 ?Volume (cm?) : 0.02 0 0.401 ?% Reduction in Area: 87.20% 100.00% 66.00% ?% Reduction in Volume: 86.90% 100.00% 66.00% ?Classification: Full  Thickness Without Exposed Full Thickness Without Exposed Full Thickness Without Exposed ?Support Structures Support Structures Support Structures ?Exudate Amount: Small None Present Medium ?Exudate Type: Serosanguineous N/A Serosanguineous ?Exudate Color: red, brown N/A red, brown ?Granulation Amount: Large (67-100%) None Present (0%) Large (67-100%) ?Granulation Quality: N/A N/A Red ?Necrotic Amount: Small (1-33%) None Present (0%) Small (1-33%) ?Exposed Structures: ?Fat Layer (Subcutaneous Tissue): ?Fascia: No Fat Layer (Subcutaneous Tissue): ?Yes Fat Layer (Subcutaneous Tissue): Yes ?Fascia: No No ?Fascia: No ?Tendon: No ?Tendon: No ?Tendon: No ?Muscle: No ?Muscle: No ?Muscle: No ?Joint: No ?Joint: No ?Joint: No ?Bone: No ?Bone: No ?Bone: No ?Epithelialization: None Large (67-100%) None ?Treatment Notes ?Electronic Signature(s) ?Signed: 03/21/2022 3:34:42 PM By: Donnamarie Poag ?Entered ByDonnamarie Poag on 03/21/2022 11:55:37 ?SHONTIA, GILLOOLY ([id number]) ?SHAYANNA, THATCH ([id number]) ?-------------------------------------------------------------------------------- ?Multi-Disciplinary Care Plan Details ?Patient Name: Barnett, Natasha ?Date of  Service: 03/21/2022 11:30 AM ?Medical Record Number: [id number] ?Patient Account Number: [id number] ?Date of Birth/Sex: August 25, 1930 (88 y.o. F) ?Treating RN: Donnamarie Poag ?Primary Care Ebin Palazzi: Fulton Reek Other Clinician: ?Referring Esaiah Wanless: Fulton Reek ?Treating Kenika Sahm/Extender: Jeri Cos ?Weeks in Treatment: 4 ?Active Inactive ?Wound/Skin Impairment ?Nursing Diagnoses: ?Knowledge deficit related to ulceration/compromised skin integrity ?Goals: ?Patient/caregiver will verbalize understanding of skin care regimen ?Date Initiated: 02/17/2022 ?Date Inactivated: 03/21/2022 ?Target Resolution Date: 03/20/2022 ?Goal Status: Met ?Ulcer/skin breakdown will have a volume reduction of 30% by week 4 ?Date Initiated: 02/17/2022 ?Date Inactivated: 03/21/2022 ?Target Resolution Date: 03/20/2022 ?Goal Status: Met ?Ulcer/skin breakdown will have a volume reduction of 50% by week 8 ?Date Initiated: 02/17/2022 ?Target Resolution Date: 04/19/2022 ?Goal Status: Active ?Ulcer/skin breakdown will have a volume reduction of 80% by week 12 ?Date Initiated: 02/17/2022 ?Target Resolution Date: 05/20/2022 ?Goal Status: Active ?Ulcer/skin breakdown will heal within 14 weeks ?Date Initiated: 02/17/2022 ?Target Resolution Date: 06/19/2022 ?Goal Status: Active ?Interventions: ?Assess patient/caregiver ability to obtain necessary supplies ?Assess patient/caregiver ability to perform ulcer/skin care regimen upon admission and as needed ?Assess ulceration(s) every visit ?Notes: ?Electronic Signature(s) ?Signed: 03/21/2022 3:34:42 PM By: Donnamarie Poag ?Entered ByDonnamarie Poag on 03/21/2022 11:54:44 ?DELSY, ETZKORN ([id number]) ?-------------------------------------------------------------------------------- ?Pain Assessment Details ?Patient Name: MARGARINE, GROSSHANS ?Date of Service: 03/21/2022 11:30 AM ?Medical Record Number: [id number] ?Patient Account Number: [id number] ?Date of Birth/Sex: [birth year] (88 y.o. F) ?Treating RN: Donnamarie Poag ?Primary Care Xana Bradt:  Fulton Reek Other Clinician: ?Referring Trevonn Hallum: Fulton Reek ?Treating Torrey Horseman/Extender: Jeri Cos ?Weeks in Treatment: 4 ?Active Problems ?Location of Pain Severity and Description of Pain ?Patient Has

## 2022-03-29 ENCOUNTER — Encounter: Payer: Medicare Other | Admitting: Physician Assistant

## 2022-03-29 DIAGNOSIS — S81812A Laceration without foreign body, left lower leg, initial encounter: Secondary | ICD-10-CM | POA: Diagnosis not present
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
## 2022-03-30 NOTE — Progress Notes (Signed)
[name], [name] ([id number]) ?Visit Report for 03/29/2022 ?Arrival Information Details ?Patient Name: [name], [name] ?Date of Service: 03/29/2022 2:00 PM ?Medical Record Number: [id number] ?Patient Account Number: [id number] ?Date of Birth/Sex: Mar 28, 1930 (88 y.o. F) ?Treating RN: Yevonne Pax ?Primary Care Joycelynn Fritsche: Aram Beecham Other Clinician: ?Referring Cyndie Woodbeck: Aram Beecham ?Treating Avalie Oconnor/Extender: Allen Derry ?Weeks in Treatment: 5 ?Visit Information History Since Last Visit ?All ordered tests and consults were completed: No ?Patient Arrived: Wheel Chair ?Added or deleted any medications: No ?Arrival Time: 14:10 ?Any new allergies or adverse reactions: No ?Accompanied By: son ?Had a fall or experienced change in No ?Transfer Assistance: None ?activities of daily living that may affect ?Patient Identification Verified: Yes ?risk of falls: ?Secondary Verification Process Completed: Yes ?Signs or symptoms of abuse/neglect since last visito No ?Patient Requires Transmission-Based Precautions: No ?Hospitalized since last visit: No ?Patient Has Alerts: Yes ?Implantable device outside of the clinic excluding No ?Patient Alerts: NOT diabetic ?cellular tissue based products placed in the center ?since last visit: ?Has Dressing in Place as Prescribed: Yes ?Pain Present Now: No ?Electronic Signature(s) ?Signed: 03/29/2022 4:47:42 PM By: Yevonne Pax RN ?Entered By: Yevonne Pax on 03/29/2022 14:17:15 ?[name], [name] ([id number]) ?-------------------------------------------------------------------------------- ?Clinic Level of Care Assessment Details ?Patient Name: YEMAYA, BARNIER ?Date of Service: 03/29/2022 2:00 PM ?Medical Record Number: [id number] ?Patient Account Number: [id number] ?Date of Birth/Sex: 07-24-1930 (88 y.o. F) ?Treating RN: Yevonne Pax ?Primary Care Lilley Hubble: Aram Beecham Other Clinician: ?Referring Shiloh Swopes: Aram Beecham ?Treating Violett Hobbs/Extender: Allen Derry ?Weeks in Treatment:  5 ?Clinic Level of Care Assessment Items ?TOOL 4 Quantity Score ?X - Use when only an EandM is performed on FOLLOW-UP visit 1 0 ?ASSESSMENTS - Nursing Assessment / Reassessment ?X - Reassessment of Co-morbidities (includes updates in patient status) 1 10 ?X- 1 5 ?Reassessment of Adherence to Treatment Plan ?ASSESSMENTS - Wound and Skin Assessment / Reassessment ?  - Simple Wound Assessment / Reassessment - one wound 0 ?X- 2 5 ?Complex Wound Assessment / Reassessment - multiple wounds ?  - 0 ?Dermatologic / Skin Assessment (not related to wound area) ?ASSESSMENTS - Focused Assessment ?  - Circumferential Edema Measurements - multi extremities 0 ?  - 0 ?Nutritional Assessment / Counseling / Intervention ?  - 0 ?Lower Extremity Assessment (monofilament, tuning fork, pulses) ?  - 0 ?Peripheral Arterial Disease Assessment (using hand held doppler) ?ASSESSMENTS - Ostomy and/or Continence Assessment and Care ?  - Incontinence Assessment and Management 0 ?  - 0 ?Ostomy Care Assessment and Management (repouching, etc.) ?PROCESS - Coordination of Care ?X - Simple Patient / Family Education for ongoing care 1 15 ?  - 0 ?Complex (extensive) Patient / Family Education for ongoing care ?  - 0 ?Staff obtains Consents, Records, Test Results / Process Orders ?  - 0 ?Staff telephones HHA, Nursing Homes / Clarify orders / etc ?  - 0 ?Routine Transfer to another Facility (non-emergent condition) ?  - 0 ?Routine Hospital Admission (non-emergent condition) ?  - 0 ?New Admissions / Manufacturing engineer / Ordering NPWT, Apligraf, etc. ?  - 0 ?Emergency Hospital Admission (emergent condition) ?X- 1 10 ?Simple Discharge Coordination ?  - 0 ?Complex (extensive) Discharge Coordination ?PROCESS - Special Needs ?  - Pediatric / Minor Patient Management 0 ?  - 0 ?Isolation Patient Management ?  - 0 ?Hearing / Language / Visual special needs ?  - 0 ?Assessment of Community assistance (transportation, D/C planning, etc.) ?   - 0 ?Additional assistance / Altered mentation ?  - 0 ?Support Surface(s) Assessment (bed, cushion, seat, etc.) ?INTERVENTIONS - Wound Cleansing / Measurement ?[name], [name] ([id number]) ?  - 0 ?  Simple Wound Cleansing - one wound ?X- 2 5 ?Complex Wound Cleansing - multiple wounds ?X- 1 5 ?Wound Imaging (photographs - any number of wounds) ?[]  - 0 ?Wound Tracing (instead of photographs) ?[]  - 0 ?Simple Wound Measurement - one wound ?X- 2 5 ?Complex Wound Measurement - multiple wounds ?INTERVENTIONS - Wound Dressings ?X - Small Wound Dressing one or multiple wounds 2 10 ?[]  - 0 ?Medium Wound Dressing one or multiple wounds ?[]  - 0 ?Large Wound Dressing one or multiple wounds ?[]  - 0 ?Application of Medications - topical ?[]  - 0 ?Application of Medications - injection ?INTERVENTIONS - Miscellaneous ?[]  - External ear exam 0 ?[]  - 0 ?Specimen Collection (cultures, biopsies, blood, body fluids, etc.) ?[]  - 0 ?Specimen(s) / Culture(s) sent or taken to Lab for analysis ?[]  - 0 ?Patient Transfer (multiple staff / Nurse, adultHoyer Lift / Similar devices) ?[]  - 0 ?Simple Staple / Suture removal (25 or less) ?[]  - 0 ?Complex Staple / Suture removal (26 or more) ?[]  - 0 ?Hypo / Hyperglycemic Management (close monitor of Blood Glucose) ?[]  - 0 ?Ankle / Brachial Index (ABI) - do not check if billed separately ?X- 1 5 ?Vital Signs ?Has the patient been seen at the hospital within the last three years: Yes ?Total Score: 100 ?Level Of Care: New/Established - Level ?3 ?Electronic Signature(s) ?Signed: 03/29/2022 4:54:34 PM By: Yevonne PaxEpps, Carrie RN ?Previous Signature: 03/29/2022 4:47:42 PM Version By: Yevonne PaxEpps, Carrie RN ?Entered By: Yevonne PaxEpps, Carrie on 03/29/2022 16:53:40 ?[name], [name] ([id number]) ?-------------------------------------------------------------------------------- ?Encounter Discharge Information Details ?Patient Name: [name], [name] ?Date of Service: 03/29/2022 2:00 PM ?Medical Record Number: [id number] ?Patient Account Number:  [id number] ?Date of Birth/Sex: 12/09/1930 (88 y.o. F) ?Treating RN: Yevonne PaxEpps, Carrie ?Primary Care Paxon Propes: Aram BeechamSparks, Jeffrey Other Clinician: ?Referring Estill Llerena: Aram BeechamSparks, Jeffrey ?Treating Maebell Lyvers/Extender: Allen DerryStone, Hoyt ?Weeks in Treatment: 5 ?Encounter Discharge Information Items ?Discharge Condition: Stable ?Ambulatory Status: Wheelchair ?Discharge Destination: Home ?Transportation: Private Auto ?Accompanied By: son ?Schedule Follow-up Appointment: Yes ?Clinical Summary of Care: Patient Declined ?Electronic Signature(s) ?Signed: 03/29/2022 4:33:49 PM By: Yevonne PaxEpps, Carrie RN ?Entered By: Yevonne PaxEpps, Carrie on 03/29/2022 16:33:49 ?[name], [name] ([id number]) ?-------------------------------------------------------------------------------- ?Lower Extremity Assessment Details ?Patient Name: [name], [name] ?Date of Service: 03/29/2022 2:00 PM ?Medical Record Number: [id number] ?Patient Account Number: [id number] ?Date of Birth/Sex: 01/15/1930 (88 y.o. F) ?Treating RN: Yevonne PaxEpps, Carrie ?Primary Care Selyna Klahn: Aram BeechamSparks, Jeffrey Other Clinician: ?Referring Priscille Shadduck: Aram BeechamSparks, Jeffrey ?Treating Mandrell Vangilder/Extender: Allen DerryStone, Hoyt ?Weeks in Treatment: 5 ?Edema Assessment ?Assessed: [Left: No] [Right: No] ?[Left: Edema] [Right: :] ?Calf ?Left: Right: ?Point of Measurement: 32 cm From Medial Instep 33 cm 33 cm ?Ankle ?Left: Right: ?Point of Measurement: 10 cm From Medial Instep 23 cm 22 cm ?Vascular Assessment ?Pulses: ?Dorsalis Pedis ?Palpable: [Left:Yes] [Right:Yes] ?Electronic Signature(s) ?Signed: 03/29/2022 4:47:42 PM By: Yevonne PaxEpps, Carrie RN ?Entered By: Yevonne PaxEpps, Carrie on 03/29/2022 14:33:56 ?[name], Alletta V. ([id number]) ?-------------------------------------------------------------------------------- ?Multi Wound Chart Details ?Patient Name: [name], Azusena V. ?Date of Service: 03/29/2022 2:00 PM ?Medical Record Number: [id number] ?Patient Account Number: [id number] ?Date of Birth/Sex: 10/05/1930 (88 y.o. F) ?Treating RN: Yevonne PaxEpps, Carrie ?Primary Care Connelly Spruell:  Aram BeechamSparks, Jeffrey Other Clinician: ?Referring Jawana Reagor: Aram BeechamSparks, Jeffrey ?Treating Mame Twombly/Extender: Allen DerryStone, Hoyt ?Weeks in Treatment: 5 ?Vital Signs ?Height(in): 60 ?Pulse(bpm): 66 ?Weight(lbs): 132 ?Blood Pressure(mmHg)

## 2022-04-07 ENCOUNTER — Encounter: Payer: Medicare Other | Admitting: Internal Medicine

## 2022-04-07 DIAGNOSIS — S81812A Laceration without foreign body, left lower leg, initial encounter: Secondary | ICD-10-CM | POA: Diagnosis not present
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
## 2022-04-08 NOTE — Progress Notes (Signed)
[name], [name] ([id number]) ?Visit Report for 04/07/2022 ?Arrival Information Details ?Patient Name: [name], [name] ?Date of Service: 04/07/2022 1:15 PM ?Medical Record Number: [id number] ?Patient Account Number: [id number] ?Date of Birth/Sex: January 05, 1930 (88 y.o. F) ?Treating RN: Carlene Coria ?Primary Care Tiondra Fang: Fulton Reek Other Clinician: ?Referring Almond Fitzgibbon: Fulton Reek ?Treating Burman Bruington/Extender: Ricard Dillon ?Weeks in Treatment: 7 ?Visit Information History Since Last Visit ?All ordered tests and consults were completed: No ?Patient Arrived: Wheel Chair ?Added or deleted any medications: No ?Arrival Time: 13:21 ?Any new allergies or adverse reactions: No ?Accompanied By: caregiver ?Had a fall or experienced change in No ?Transfer Assistance: None ?activities of daily living that may affect ?Patient Identification Verified: Yes ?risk of falls: ?Secondary Verification Process Completed: Yes ?Signs or symptoms of abuse/neglect since last visito No ?Patient Requires Transmission-Based Precautions: No ?Hospitalized since last visit: No ?Patient Has Alerts: Yes ?Implantable device outside of the clinic excluding No ?Patient Alerts: NOT diabetic ?cellular tissue based products placed in the center ?since last visit: ?Has Dressing in Place as Prescribed: Yes ?Has Compression in Place as Prescribed: Yes ?Pain Present Now: No ?Electronic Signature(s) ?Signed: 04/08/2022 1:52:28 PM By: Carlene Coria RN ?Entered By: Carlene Coria on 04/07/2022 13:25:50 ?[name], [name] ([id number]) ?-------------------------------------------------------------------------------- ?Clinic Level of Care Assessment Details ?Patient Name: [name], [name] ?Date of Service: 04/07/2022 1:15 PM ?Medical Record Number: [id number] ?Patient Account Number: [id number] ?Date of Birth/Sex: 04/08/1930 (88 y.o. F) ?Treating RN: Carlene Coria ?Primary Care Shevon Sian: Fulton Reek Other Clinician: ?Referring Puanani Gene: Fulton Reek ?Treating  Mckenzee Beem/Extender: Ricard Dillon ?Weeks in Treatment: 7 ?Clinic Level of Care Assessment Items ?TOOL 4 Quantity Score ?X - Use when only an EandM is performed on FOLLOW-UP visit 1 0 ?ASSESSMENTS - Nursing Assessment / Reassessment ?X - Reassessment of Co-morbidities (includes updates in patient status) 1 10 ?X- 1 5 ?Reassessment of Adherence to Treatment Plan ?ASSESSMENTS - Wound and Skin Assessment / Reassessment ?[]  - Simple Wound Assessment / Reassessment - one wound 0 ?X- 2 5 ?Complex Wound Assessment / Reassessment - multiple wounds ?[]  - 0 ?Dermatologic / Skin Assessment (not related to wound area) ?ASSESSMENTS - Focused Assessment ?[]  - Circumferential Edema Measurements - multi extremities 0 ?[]  - 0 ?Nutritional Assessment / Counseling / Intervention ?[]  - 0 ?Lower Extremity Assessment (monofilament, tuning fork, pulses) ?[]  - 0 ?Peripheral Arterial Disease Assessment (using hand held doppler) ?ASSESSMENTS - Ostomy and/or Continence Assessment and Care ?[]  - Incontinence Assessment and Management 0 ?[]  - 0 ?Ostomy Care Assessment and Management (repouching, etc.) ?PROCESS - Coordination of Care ?X - Simple Patient / Family Education for ongoing care 1 15 ?[]  - 0 ?Complex (extensive) Patient / Family Education for ongoing care ?[]  - 0 ?Staff obtains Consents, Records, Test Results / Process Orders ?[]  - 0 ?Staff telephones HHA, Nursing Homes / Clarify orders / etc ?[]  - 0 ?Routine Transfer to another Facility (non-emergent condition) ?[]  - 0 ?Routine Hospital Admission (non-emergent condition) ?[]  - 0 ?New Admissions / Biomedical engineer / Ordering NPWT, Apligraf, etc. ?[]  - 0 ?Emergency Hospital Admission (emergent condition) ?X- 1 10 ?Simple Discharge Coordination ?[]  - 0 ?Complex (extensive) Discharge Coordination ?PROCESS - Special Needs ?[]  - Pediatric / Minor Patient Management 0 ?[]  - 0 ?Isolation Patient Management ?[]  - 0 ?Hearing / Language / Visual special needs ?[]  - 0 ?Assessment of  Community assistance (transportation, D/C planning, etc.) ?[]  - 0 ?Additional assistance / Altered mentation ?[]  - 0 ?Support Surface(s) Assessment (bed, cushion, seat, etc.) ?INTERVENTIONS - Wound Cleansing /  Measurement ?[name], [name] ([id number]) ?[]  - 0 ?Simple Wound Cleansing - one wound ?X- 2 5 ?Complex Wound Cleansing - multiple wounds ?X- 1 5 ?Wound Imaging (photographs - any number of wounds) ?[]  - 0 ?Wound Tracing (instead of photographs) ?[]  - 0 ?Simple Wound Measurement - one wound ?X- 2 5 ?Complex Wound Measurement - multiple wounds ?INTERVENTIONS - Wound Dressings ?X - Small Wound Dressing one or multiple wounds 2 10 ?[]  - 0 ?Medium Wound Dressing one or multiple wounds ?[]  - 0 ?Large Wound Dressing one or multiple wounds ?[]  - 0 ?Application of Medications - topical ?[]  - 0 ?Application of Medications - injection ?INTERVENTIONS - Miscellaneous ?[]  - External ear exam 0 ?[]  - 0 ?Specimen Collection (cultures, biopsies, blood, body fluids, etc.) ?[]  - 0 ?Specimen(s) / Culture(s) sent or taken to Lab for analysis ?[]  - 0 ?Patient Transfer (multiple staff / Civil Service fast streamer / Similar devices) ?[]  - 0 ?Simple Staple / Suture removal (25 or less) ?[]  - 0 ?Complex Staple / Suture removal (26 or more) ?[]  - 0 ?Hypo / Hyperglycemic Management (close monitor of Blood Glucose) ?[]  - 0 ?Ankle / Brachial Index (ABI) - do not check if billed separately ?X- 1 5 ?Vital Signs ?Has the patient been seen at the hospital within the last three years: Yes ?Total Score: 100 ?Level Of Care: New/Established - Level ?3 ?Electronic Signature(s) ?Signed: 04/08/2022 1:52:28 PM By: Carlene Coria RN ?Entered By: Carlene Coria on 04/07/2022 13:59:57 ?[name], [name] ([id number]) ?-------------------------------------------------------------------------------- ?Encounter Discharge Information Details ?Patient Name: [name], [name] ?Date of Service: 04/07/2022 1:15 PM ?Medical Record Number: [id number] ?Patient Account Number: [id number] ?Date  of Birth/Sex: 20-Mar-1930 (88 y.o. F) ?Treating RN: Carlene Coria ?Primary Care Oluwadara Gorman: Fulton Reek Other Clinician: ?Referring Quintella Mura: Fulton Reek ?Treating Lakishia Bourassa/Extender: Ricard Dillon ?Weeks in Treatment: 7 ?Encounter Discharge Information Items ?Discharge Condition: Stable ?Ambulatory Status: Wheelchair ?Discharge Destination: Home ?Transportation: Private Auto ?Accompanied By: caregiver ?Schedule Follow-up Appointment: Yes ?Clinical Summary of Care: Patient Declined ?Electronic Signature(s) ?Signed: 04/08/2022 1:52:28 PM By: Carlene Coria RN ?Entered By: Carlene Coria on 04/07/2022 14:01:16 ?[name], [name] ([id number]) ?-------------------------------------------------------------------------------- ?Lower Extremity Assessment Details ?Patient Name: [name], [name] ?Date of Service: 04/07/2022 1:15 PM ?Medical Record Number: [id number] ?Patient Account Number: [id number] ?Date of Birth/Sex: 08/08/1930 (88 y.o. F) ?Treating RN: Carlene Coria ?Primary Care Perez Dirico: Fulton Reek Other Clinician: ?Referring Maxim Bedel: Fulton Reek ?Treating Carmelita Amparo/Extender: Ricard Dillon ?Weeks in Treatment: 7 ?Edema Assessment ?Assessed: [Left: No] [Right: No] ?Edema: [Left: Yes] [Right: Yes] ?Calf ?Left: Right: ?Point of Measurement: 32 cm From Medial Instep 33 cm 33 cm ?Ankle ?Left: Right: ?Point of Measurement: 10 cm From Medial Instep 23 cm 22 cm ?Electronic Signature(s) ?Signed: 04/08/2022 1:52:28 PM By: Carlene Coria RN ?Entered ByCarlene Coria on 04/07/2022 13:36:24 ?[name], [name] ([id number]) ?-------------------------------------------------------------------------------- ?Multi Wound Chart Details ?Patient Name: [name], [name] ?Date of Service: 04/07/2022 1:15 PM ?Medical Record Number: [id number] ?Patient Account Number: [id number] ?Date of Birth/Sex: December 01, 1930 (88 y.o. F) ?Treating RN: Carlene Coria ?Primary Care Nashaun Hillmer: Fulton Reek Other Clinician: ?Referring Noreen Mackintosh: Fulton Reek ?Treating Davell Beckstead/Extender: Ricard Dillon ?Weeks in Treatment: 7 ?Vital Signs ?Height(in): 60 ?Pulse(bpm): 68 ?Weight(lbs): 132 ?Blood Pressure(mmHg): 152/83 ?Body Mass Index(BMI): 25.8 ?Temperature(??F):

## 2022-04-15 DIAGNOSIS — M7070 Other bursitis of hip, unspecified hip: Secondary | ICD-10-CM | POA: Insufficient documentation

## 2022-04-15 DIAGNOSIS — G3184 Mild cognitive impairment, so stated: Secondary | ICD-10-CM | POA: Diagnosis present

## 2022-04-18 ENCOUNTER — Encounter: Payer: Medicare Other | Attending: Internal Medicine | Admitting: Internal Medicine

## 2022-04-18 DIAGNOSIS — I872 Venous insufficiency (chronic) (peripheral): Secondary | ICD-10-CM | POA: Insufficient documentation

## 2022-04-18 DIAGNOSIS — S81812A Laceration without foreign body, left lower leg, initial encounter: Secondary | ICD-10-CM | POA: Insufficient documentation

## 2022-04-18 DIAGNOSIS — L97812 Non-pressure chronic ulcer of other part of right lower leg with fat layer exposed: Secondary | ICD-10-CM | POA: Insufficient documentation

## 2022-04-18 DIAGNOSIS — S81811A Laceration without foreign body, right lower leg, initial encounter: Secondary | ICD-10-CM | POA: Diagnosis not present

## 2022-04-18 DIAGNOSIS — M419 Scoliosis, unspecified: Secondary | ICD-10-CM | POA: Diagnosis not present

## 2022-04-18 DIAGNOSIS — Z950 Presence of cardiac pacemaker: Secondary | ICD-10-CM | POA: Insufficient documentation

## 2022-04-18 DIAGNOSIS — X58XXXA Exposure to other specified factors, initial encounter: Secondary | ICD-10-CM | POA: Diagnosis not present

## 2022-04-18 DIAGNOSIS — L97822 Non-pressure chronic ulcer of other part of left lower leg with fat layer exposed: Secondary | ICD-10-CM | POA: Diagnosis not present

## 2022-04-20 NOTE — Progress Notes (Signed)
[name], [name] ([id number]) ?Visit Report for 04/18/2022 ?Arrival Information Details ?Patient Name: [name], [name] ?Date of Service: 04/18/2022 2:45 PM ?Medical Record Number: [id number] ?Patient Account Number: [id number] ?Date of Birth/Sex: 01/25/1930 (88 y.o. F) ?Treating RN: Yevonne PaxEpps, Carrie ?Primary Care Fredonia Casalino: Aram BeechamSparks, Jeffrey Other Clinician: ?Referring Omeed Osuna: Aram BeechamSparks, Jeffrey ?Treating Eleftherios Dudenhoeffer/Extender: Maxwell CaulOBSON, [name] ?Weeks in Treatment: 8 ?Visit Information History Since Last Visit ?All ordered tests and consults were completed: No ?Patient Arrived: Wheel Chair ?Added or deleted any medications: No ?Arrival Time: 15:05 ?Any new allergies or adverse reactions: No ?Accompanied By: son ?Had a fall or experienced change in No ?Transfer Assistance: None ?activities of daily living that may affect ?Patient Identification Verified: Yes ?risk of falls: ?Secondary Verification Process Completed: Yes ?Signs or symptoms of abuse/neglect since last visito No ?Patient Requires Transmission-Based Precautions: No ?Hospitalized since last visit: No ?Patient Has Alerts: Yes ?Implantable device outside of the clinic excluding No ?Patient Alerts: NOT diabetic ?cellular tissue based products placed in the center ?since last visit: ?Has Dressing in Place as Prescribed: Yes ?Pain Present Now: No ?Electronic Signature(s) ?Signed: 04/20/2022 3:40:11 PM By: Yevonne PaxEpps, Carrie RN ?Entered By: Yevonne PaxEpps, Carrie on 04/18/2022 15:08:51 ?[name], [place] V. ([id number]) ?-------------------------------------------------------------------------------- ?Clinic Level of Care Assessment Details ?Patient Name: [name], [name] ?Date of Service: 04/18/2022 2:45 PM ?Medical Record Number: [id number] ?Patient Account Number: [id number] ?Date of Birth/Sex: 10/07/1930 (88 y.o. F) ?Treating RN: Yevonne PaxEpps, Carrie ?Primary Care Taavi Hoose: Aram BeechamSparks, Jeffrey Other Clinician: ?Referring Loveah Like: Aram BeechamSparks, Jeffrey ?Treating Chigozie Basaldua/Extender: Maxwell CaulOBSON, [name] ?Weeks in Treatment:  8 ?Clinic Level of Care Assessment Items ?TOOL 4 Quantity Score ?[]  - Use when only an EandM is performed on FOLLOW-UP visit 0 ?ASSESSMENTS - Nursing Assessment / Reassessment ?[]  - Reassessment of Co-morbidities (includes updates in patient status) 0 ?[]  - 0 ?Reassessment of Adherence to Treatment Plan ?ASSESSMENTS - Wound and Skin Assessment / Reassessment ?[]  - Simple Wound Assessment / Reassessment - one wound 0 ?[]  - 0 ?Complex Wound Assessment / Reassessment - multiple wounds ?[]  - 0 ?Dermatologic / Skin Assessment (not related to wound area) ?ASSESSMENTS - Focused Assessment ?[]  - Circumferential Edema Measurements - multi extremities 0 ?[]  - 0 ?Nutritional Assessment / Counseling / Intervention ?[]  - 0 ?Lower Extremity Assessment (monofilament, tuning fork, pulses) ?[]  - 0 ?Peripheral Arterial Disease Assessment (using hand held doppler) ?ASSESSMENTS - Ostomy and/or Continence Assessment and Care ?[]  - Incontinence Assessment and Management 0 ?[]  - 0 ?Ostomy Care Assessment and Management (repouching, etc.) ?PROCESS - Coordination of Care ?[]  - Simple Patient / Family Education for ongoing care 0 ?[]  - 0 ?Complex (extensive) Patient / Family Education for ongoing care ?[]  - 0 ?Staff obtains Consents, Records, Test Results / Process Orders ?[]  - 0 ?Staff telephones HHA, Nursing Homes / Clarify orders / etc ?[]  - 0 ?Routine Transfer to another Facility (non-emergent condition) ?[]  - 0 ?Routine Hospital Admission (non-emergent condition) ?[]  - 0 ?New Admissions / Manufacturing engineernsurance Authorizations / Ordering NPWT, Apligraf, etc. ?[]  - 0 ?Emergency Hospital Admission (emergent condition) ?[]  - 0 ?Simple Discharge Coordination ?[]  - 0 ?Complex (extensive) Discharge Coordination ?PROCESS - Special Needs ?[]  - Pediatric / Minor Patient Management 0 ?[]  - 0 ?Isolation Patient Management ?[]  - 0 ?Hearing / Language / Visual special needs ?[]  - 0 ?Assessment of Community assistance (transportation, D/C planning, etc.) ?[]  -  0 ?Additional assistance / Altered mentation ?[]  - 0 ?Support Surface(s) Assessment (bed, cushion, seat, etc.) ?INTERVENTIONS - Wound Cleansing / Measurement ?[name], [name] ([id number]) ?[]  - 0 ?Simple  Wound Cleansing - one wound ?  - 0 ?Complex Wound Cleansing - multiple wounds ?  - 0 ?Wound Imaging (photographs - any number of wounds) ?  - 0 ?Wound Tracing (instead of photographs) ?  - 0 ?Simple Wound Measurement - one wound ?  - 0 ?Complex Wound Measurement - multiple wounds ?INTERVENTIONS - Wound Dressings ?  - Small Wound Dressing one or multiple wounds 0 ?  - 0 ?Medium Wound Dressing one or multiple wounds ?  - 0 ?Large Wound Dressing one or multiple wounds ?  - 0 ?Application of Medications - topical ?  - 0 ?Application of Medications - injection ?INTERVENTIONS - Miscellaneous ?  - External ear exam 0 ?  - 0 ?Specimen Collection (cultures, biopsies, blood, body fluids, etc.) ?  - 0 ?Specimen(s) / Culture(s) sent or taken to Lab for analysis ?  - 0 ?Patient Transfer (multiple staff / Nurse, adult / Similar devices) ?  - 0 ?Simple Staple / Suture removal (25 or less) ?  - 0 ?Complex Staple / Suture removal (26 or more) ?  - 0 ?Hypo / Hyperglycemic Management (close monitor of Blood Glucose) ?  - 0 ?Ankle / Brachial Index (ABI) - do not check if billed separately ?  - 0 ?Vital Signs ?Has the patient been seen at the hospital within the last three years: Yes ?Total Score: 0 ?Level Of Care: ____ ?Electronic Signature(s) ?Signed: 04/20/2022 3:40:11 PM By: Yevonne Pax RN ?Entered By: Yevonne Pax on 04/18/2022 15:50:20 ?[name], [name] ([id number]) ?-------------------------------------------------------------------------------- ?Encounter Discharge Information Details ?Patient Name: Natasha Basset ?Date of Service: 04/18/2022 2:45 PM ?Medical Record Number: [id number] ?Patient Account Number: [id number] ?Date of Birth/Sex: [birth year] (88 y.o. F) ?Treating RN: Yevonne Pax ?Primary Care Taisha Pennebaker:  Aram Beecham Other Clinician: ?Referring Lakai Moree: Aram Beecham ?Treating Trusten Hume/Extender: Maxwell Caul ?Weeks in Treatment: 8 ?Encounter Discharge Information Items Post Procedure Vitals ?Discharge Condition: Stable ?Temperature (?F): 97.7 ?Ambulatory Status: Wheelchair ?Pulse (bpm): 68 ?Discharge Destination: Home ?Respiratory Rate (breaths/min): 18 ?Transportation: Private Auto ?Blood Pressure (mmHg): 162/76 ?Accompanied By: self ?Schedule Follow-up Appointment: Yes ?Clinical Summary of Care: Patient Declined ?Electronic Signature(s) ?Signed: 04/20/2022 3:40:11 PM By: Yevonne Pax RN ?Entered By: Yevonne Pax on 04/18/2022 15:52:11 ?[name], [name] ([id number]) ?-------------------------------------------------------------------------------- ?Lower Extremity Assessment Details ?Patient Name: Natasha Basset ?Date of Service: 04/18/2022 2:45 PM ?Medical Record Number: [id number] ?Patient Account Number: [id number] ?Date of Birth/Sex: [birth year] (88 y.o. F) ?Treating RN: Yevonne Pax ?Primary Care Josejulian Tarango: Aram Beecham Other Clinician: ?Referring Hanalei Glace: Aram Beecham ?Treating Brick Ketcher/Extender: Maxwell Caul ?Weeks in Treatment: 8 ?Edema Assessment ?Assessed: [Left: No] [Right: No] ?[Left: Edema] [Right: :] ?Calf ?Left: Right: ?Point of Measurement: 32 cm From Medial Instep 33 cm 33 cm ?Ankle ?Left: Right: ?Point of Measurement: 10 cm From Medial Instep 23 cm 22 cm ?Electronic Signature(s) ?Signed: 04/20/2022 3:40:11 PM By: Yevonne Pax RN ?Entered By: Yevonne Pax on 04/18/2022 15:27:37 ?[name], [name] ([id number]) ?-------------------------------------------------------------------------------- ?Multi Wound Chart Details ?Patient Name: Natasha Basset ?Date of Service: 04/18/2022 2:45 PM ?Medical Record Number: [id number] ?Patient Account Number: [id number] ?Date of Birth/Sex: 08-12-1930 (88 y.o. F) ?Treating RN: Yevonne Pax ?Primary Care Bethel Gaglio: Aram Beecham Other Clinician: ?Referring  Ceyda Peterka: Aram Beecham ?Treating Jenner Rosier/Extender: Maxwell Caul ?Weeks in Treatment: 8 ?Vital Signs ?Height(in): 60 ?Pulse(bpm): 68 ?Weight(lbs): 132 ?Blood Pressure(mmHg): 162/76 ?Body Mass Index(B
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
## 2022-04-28 ENCOUNTER — Encounter: Payer: Medicare Other | Admitting: Physician Assistant

## 2022-04-28 DIAGNOSIS — S81811A Laceration without foreign body, right lower leg, initial encounter: Secondary | ICD-10-CM | POA: Diagnosis not present

## 2022-04-29 NOTE — Progress Notes (Signed)
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 been tolerating the dressing changes without complication. With that being said she does note that she's been having a lot of burning with the current dressing which is specifically the Iodoflex. Obviously this is a known side effect of the iodine in the dressing and I believe that may be giving her trouble. No fevers, chills, nausea, or  vomiting noted at this time. Otherwise the wound does appear to be doing well. 01/30/18 on evaluation today patient appears to be doing well in regard to her right anterior lower extremity ulcer. She notes that this does seem to be smaller and she wonders why we did not start the Prisma dressing sooner since it has made such a big difference in such a short amount of time. I explained that obviously we have to wait for the wound to get to a certain point along his healing path before we can initiate the Prisma otherwise it will not be effective. Therefore once the wound became clean it was then time to initiate the Prisma. Nonetheless good news is she is noting excellent improvement she does still have some discomfort but nothing as significant as previously noted. 04/17/18 on evaluation today patient appears to be doing very well and in fact her right lower extremity ulcer has completely healed at this point I'm pleased with this. The left lower extremity ulcer seem to be doing better although she still does have some openings noted the Prisma I think is helping more than the Xeroform was in my pinion. With that being said she still has a lot of healing to do in this regard. 04/27/18 on evaluation today patient appears to be doing very well in regard to her left lower Trinity ulcers. She has been tolerating the dressing changes without complication. I do have a note from her orthopedic surgeon today and they would like for me to help with treating her left elbow surgery site where she had the bursa removed and this was performed roughly 4 weeks ago according to the note that I reviewed. She has been placed on Bactrim DS by need for her leg wounds this probably helped a little bit with the left elbow surgery site. Obviously I do think this is something we can try to help her out with. 05/04/18 on evaluation today patient appears to be doing well in regard to her left anterior lower Trinity ulcers. She is  making good progress which is great news. Unfortunately her elbow which we are also managing at this point in time has not made as much progress unfortunately.  She has been tolerating the dressing changes without complication. She did see Dr. Benson Setting earlier today and he states that he's willing to give this three weeks to see if she's making any progress with wound care. However he states that she's really not then he will need to go back in and perform KUMIKO, FISHMAN V. ([id number]) further surgery. Obviously she is trying to avoid surgery if at all possible although I'm not sure if this is going to be possible or least not that quickly. 05/11/18 on evaluation today patient appears to be doing very well in regard to her left lower extremity ulcers. Unfortunately in regard to her elbow this is very slow coming about as far as any improvement is concerned. I do feel like there may be a little bit more granulation noted in the base of the wound but nothing too significant unfortunately. I still can probe bone in the proximal portion of the wound which obviously explain to the patient is not good. She will be having a follow-up with her orthopedic surgeon in the next couple of weeks. In the meantime we are trying to do as much as we can to try to show signs of improvement in healing to avoid the need for any additional and further surgery. Nonetheless I explained to the patient yet again today I'm not sure if that is going to be feasible or not obviously it's more risk for her to continue to have an open wound with bone exposure then to the back in for additional surgery even though I know she doesn't want to go that route. 05/15/18 on evaluation today patient presents for follow-up concerning her ongoing lower extremity ulcers on the left as well as the left elbow ulcer. She has at this point in time been tolerating the dressing changes without complication. Her left lower extremity ulcer appears to be doing  very well. In regard to the left elbow ulcer she actually does seem to have additional granulation today which is good news. I am definitely seeing signs of improvement although obviously this is somewhat slow improvement. Nonetheless I'm hopeful we will be able to avoid her having to have any further surgery but again that would definitely be a conversation between herself as well as her surgeon once he sees her for reevaluation. Otherwise she does want to see about having a three order compression stockings for her today 05/21/18 on evaluation today patient appears to be doing well in regard to her left lower surety ulcer. This is almost completely healed and seems to be progressing very nicely. With that being said her left elbow is another story. I'm not really convinced in the past three weeks we've seen a significant improvement in this wound. With that being said if this is something that there is no surgical option for him we have to continue to work on this from the standpoint of conservative management with wound care she may make improvement given time. Nonetheless it appears that her surgeon is somewhat concerned about the possibility of infection and really is leaning towards additional surgery to try and help close this wound. Nonetheless the patient is still unsure of exactly what to do. 05/29/18 on evaluation today patient appears to be doing well in regard to her left lower extremity ulcer. She's been tolerating the dressing changes without complication which is good news. With that being said she's been having issues specifically with her elbow she did see her surgeon Dr. Roland Rack and he is recommending a  repeat surgery to the left elbow in order to correct the issue. The patient is still somewhat unsure of this but feels like this may be better than trying to take time to let this heal over a longer period of time through normal wound care measures. Again I explained that I agree this  may be a faster way to go if her surgeon feels that this is indeed a good direction to take. Obviously only he can make the judgment on whether or not the surgery would likely be successful. 06/04/18 on evaluation today patient actually presents for follow-up concerning her left elbow and left lower from the ulcer she seems to be doing very well at this point in time. She has been tolerating the dressing changes without complication. With that being said her elbow is not significantly better she actually is scheduled for surgery tomorrow. 07/04/18; the patient had an area on her left leg that is remaining closed. The open area she has now is a postsurgical wound on the left elbow. I think we have clearance from the surgeon to see this now. We're using Prisma 07/11/18; we're currently dealing with a surgical wound on the left olecranon process. The patient complains of a lot of pain and drainage. When I saw her last week we did an x-ray that showed soft tissue wound and probable elbow joint effusion but no erosion to suggest osteomyelitis. The culture I did of this was somewhat surprisingly negative. She has a small open wound with not a viable surface there is considerable undermining relative to the wound size. She is on methotrexate for rheumatoid arthritis/overlap syndrome also plaquenil. We've been using silver collagen 07/18/18-She is seen in follow-up evaluation for a left elbow wound. There is essentially no change. She is currently on Zithromax and will complete that on Friday, there is no indication to extend this. We will change to iodosorb/iodoflex and monitor for response 07/25/18-She is seen in follow-up evaluation for left elbow wound. The wound is stable with no overt evidence of infection. She has counseled with her rheumatologist. She is wanting to restart her methotrexate; a culture was obtained to rule out occult infection before starting her methotrexate. We will continue with  Iodosorb/Iodoflex and she will follow-up next week. 08/01/18; this is a difficult wound over her left olecranon process. There is been concerned about infection although cultures including one done last week were negative. Pending 3 weeks ago I gave her an empiric course of antibiotics. She is having a lot of rheumatologic pain in her hands with pain and stiffness. She wants to go on her weekly methotrexate and I think it would be reasonable to do so. We have been using Iodoflex 08/01/18; difficult wound over her left olecranon process. She started back on methotrexate last week because of rheumatologic pain in her hands. We have been using Iodoflex to try and clean out the wound bed. She has been approved for Graphix PL 08/15/18; 2 week follow-up. Difficult wound over her left olecranon process. Graphix PL #1 with collagen backing 08/22/18; one-week follow-up. Difficult wound over her left olecranon process. Graphix PL #2 08/29/18; no major improvement. Difficult wound over her left olecranon process. Still considerable undermining. Graphics PL #3 1 week follow-up. Graphix #4 09/12/18 graphics #5. Some improvement in wound area although the undermining superiorly still has not closed down as much as I would like 09/19/18; Graphix #6 I think there is improvement in the undermining from 7 to 9:00. Wound bed looks healthy. 09/26/18  Graffix #7 undermining is 0.5 cm maximally at roughly 8:00. From 12 to 7:00 the tissue is adherent which is a major improvement there is some advancing skin from this side. 10/03/18; Graphix #8 no major changes from last week 10/10/18 Graffix #9 There are improvements. There appears to be granulation coming up to the surface here and there is a lot less undermining at 8:00. 10/17/18. Graffix #10; Dimensions are improved less undermining surface felt the but the wound is still open. Initially a surgical wound following a bursectomy 10/24/18; Graffix #11. This is really stalled over  the last 2 weeks. If there is no further improvement this will be the last application.The final option for this difficult area would be plastic surgery and will set up a consult with Dr. Lorayne Bender in [facility] 10/31/18; wound looks about the same. The undermining superiorly is 0.7 cm. On the lateral edges perhaps some improvement there is no drainage. 11-07-2018 patient seen today for follow-up and management of left elbow wound. She has completed a total of 11 treatments of the graffix with not much improvement. She has an upcoming appointment with plastic surgery to assist with additional treatment options for the left elbow wound on 11/19/18. There is significant amount surrounding undermining of the wound is 0.9 cm. Currently prescribed methotrexate. Wound is being treated with Indoform and border dressing. No drainage from wound. No fever, chills. or pain. 11/21/18; the patient continues to have the wound looking roughly the same with undermining from about 12 to 6:00. This has not changed all that much. She does have skin irritation around the wound that looks like drainage maceration issues. The patient states that she was not able to have her wound dressing changed because of illness in the person he usually does this. She also did not attend her clinic appointment today with Dr. Lorayne Bender because of transportation issues. She is rebooked for some time in mid January 11/28/2018; the patient has less undermining using endoform. As a understandings she saw Dr. Doy Mince who is Dr. Rebekah Chesterfield partner. He recommended putting her in a elbow brace and I believe is written a prescription for it. He also recommended Motrin 800 mg 3 times daily. This is prescription strength ibuprofen although he did not write his prescription. This apparently was for 2 weeks. Culture I did last time grew a few TAKELIA, URIETA V. ([id number]) methicillin sensitive staph aureus. After some difficulty due to drug  intolerances/allergies and drug interactions I settled on a 5-day course of azithromycin 12/03/18 on evaluation today patient actually appears to be doing fairly well in regard to her elbow when compared to last time I evaluated her. With that being said there does not appear to be any signs of infection at this time. That was the big concern currently as far as the patient was concerned. Nonetheless I do feel like she is making progress in regard to the feeling of this ulcer it has been slow. She did see a Psychiatric nurse they are talking about putting her in a brace in order to allow this area to heal more appropriately. 12/19/2018; not much change in this from the last time I have saw this.'s much smaller area than when she first came in and with less circumferential undermining however this is never really adhered. She is wearing the brace that was given or prescribed to her by plastics. She did not have a procedure offered to attempt to close this. We have been using endoform 1/15; wound actually is not doing  as well as last week. She was actually not supposed to come into this clinic again until next week but apparently her attendant noticed some redness increasing pain and she came in early. She reports the same amount of drainage. We have been using endoform. She is approved through puraply however I will only consider starting that next week 1/22; she completed the antibiotics last week. Culture I did was negative. In spite of this there is less erythema and pain complaints in the wound. Puraply #1 applied today 1/29; Puraply #2 today. Wound surface looks a lot better post debridement of adherent fibrinous material. However undermining from 6-12 is measuring worse 2/5; Puraply #3. Using her elbow brace 2/12 puraply #4 2/19 puraply #5. The 9:00 undermining measured at 0.5 cm. Undermining from 4-11 o'clock. Surface of the wound looks better and the circumference of the wound is smaller  however the undermining is not really changed 2/26; still not much improvement. She has undermining from 4-9 o'clock 0.9 cm. Surface of the wound covered and adherent debris. 3/4; still no improvement. Undermining from 4-9 o'clock still around a centimeter. Surface of the wound looks somewhat better. No debridement is required we used endoform after we ended the trial of puraply last week 3/11; really no improvement at all. Still 1 cm undermining from roughly 9-3 o'clock. This is about a centimeter. The base of the wound looks fairly healthy. No debridement. We have been using endoform. I am really out of most usual options here. I could consider either another round of an amniotic advanced treatment product example epifix or perhaps regranex. Understandably the patient is a bit frustrated. We did send her to plastic surgery for a consult. Other than prescribing her a brace to immobilize the elbow they did not think she was a candidate for any further surgery. Notable that the patient is not using the brace today 3/18-Patient returns for attention to the left elbow area which apparently looked red at the home health visit. Patient's elbow looks the same if not better compared with last visit. The area of ulceration remains the same, the base appears healthy. We are continuing to use endoform she has been encouraged to use the brace to keep the elbow straight 3/25; the patient has an appointment at the [facility] wound care center on 4/2. I had actually put her out indefinitely however she seems to want to come back here every week. This week she complains of increased pain and malodor. Dimensions of the elbow wound are larger. We have been using endoform 4/8; the patient went to [facility] where they apparently gave her meta honey and some border foam with a Tubigrip. She has been using this for a week. She says she was very impressed with them there. They did not offer her any surgical consultation. She seems  to be coming back here for a second opinion on this, she does not wish to drive to [facility] every week 4/22; still using Medihoney foam border and a Tubigrip. Actually do not think she has anything on the arm at all specifically she is not using her brace 5/6; she has been using medihoney without a lot of improvement. Undermining maximum at 9:00 at 0.5 cm may be somewhat better. She is still complaining of discomfort. She uses her elbow brace at night but is not using anything on the arm during the day, she finds it too restrictive 5/13; we switch the patient to endoform AG last week. She is complaining of more pain  and worried about some circumferential erythema around the wound. I had planned to consider epifix in this wound however the patient came in with a request to see a plastic surgeon in [place] by the name of Dr. Harl Bowie who cared for a friend of hers. Noteworthy that I have already sent her to one plastic surgeon and she went for another second opinion at [facility] and they apparently did not send her to a plastic surgeon nevertheless the patient is fairly convinced that she might benefit from a skin graft which I am doubtful. She also has rheumatoid arthritis and is on methotrexate. This was originally a surgical wound for a bursectomy a year or 2 ago Readmission: 06/28/19 on evaluation today patient presents today for reevaluation but this is due to a new issue her elbow has completely healed and looks excellent. Her right anterior lower leg has a skin tear which was sustained from her dog who jumped up on her and inadvertently scratch the area causing the skin tear. This happened yesterday. She is having some discomfort but fortunately nothing too significant which is good news. No fevers, chills, nausea, or vomiting noted at this time. The skin fortunately was knocked one completely off and we are gonna see about we approximate in the skin as best we can in using Steri-Strips to hold this  in place obviously if we can get some of this to reattach that would be beneficial 07/05/2019 on evaluation today patient actually appears to be doing okay although unfortunately the skin flap that we were attempting to Bowers down last week did not take. She is developing a lot of fluid underneath the wound area unfortunately which again is not ideal. I think that this necrotic tissue needs to be removed and again it actually just wiped off during the evaluation today as I was attempting to clean the wound there did not appear to be any significant issues underlying which is good although there was some purulent drainage I did want to go ahead and see about obtaining a culture from today in order to ensure that the Z-Pak that I placed her on earlier in the week was appropriate for treating what ever infection may be causing the issue currently. This was a deep wound culture obtained today. 07/12/2019 on evaluation today patient actually appears to be doing quite well with regard to her right lower extremity ulcer all things considering. There is still little bit of hematoma not it that is noted in the central portion of the wound along with some necrotic tissue but she is still having quite a bit of discomfort. For that reason I did not perform sharp debridement today although this wound does need some debridement of one type or another. I think we may attempt Iodoflex to see if this can be of benefit. Fortunately there is no signs of active infection at this point. 07/18/19 upon evaluation today patient appears to be doing better with regard to her ulcer on her right lower extremity. She's been tolerating the dressing changes without complication. The good news is she seems to be making excellent progress. Overall I'm pleased with there being no signs of infection. With that being said she does tell me that she's been having some discomfort with the wrap she's unsure of exactly what about the  rafters causing her trouble. 07/25/2019 on evaluation today patient appears to be doing a little better in regard to her lower extremity ulcer. Unfortunately the alginate seems to be getting really  stuck to the wound bed and surrounding periwound. Fortunately there is no signs of active infection at this time. There does not appear to be any evidence of infection currently. 08/01/2019 on evaluation today patient actually appears to be doing much better with regard to her right lower extremity ulcer. She has been [place], [place] V. ([id number]) tolerating the dressing changes without complication. Fortunately her wound does not show any signs of infection and seems to be making good progress. No fevers, chills, nausea, vomiting, or diarrhea. 08/08/2019 on evaluation today patient actually appears to be doing better with regard to her leg ulcer. She has been tolerating the dressing changes without complication. With that being said I think we may want to switch the dressing up today just based on the appearance of the wound bed in general. Fortunately there is no evidence of infection currently. Her pain seems to be much better. Her son is present with her today. 08/15/2019 on evaluation today patient appears to be doing very well with regard to her right lower extremity ulcer. She still complains about feeling like the compression stocking/Tubigrip is too tight for her. Nonetheless I still think this is beneficial and is helping the wound to heal more effectively. Overall I am extremely pleased at this time with what I am seeing. 08/22/2019 on evaluation today patient actually appears to be doing quite well with regard to her right lower extremity. She has been tolerating the dressing changes without complication. Fortunately there is no signs of active infection at this time. She still complains about the Tubigrip but nonetheless I think this is something that is of utmost importance for her to continue to  wear if she is going to see this area heal appropriately. 08/29/2019 on evaluation today patient actually appears to be doing well visually in regard to her wound. Unfortunately in regard to overall pain she seems to be having more pain today which I am somewhat concerned about. There does not appear to be any signs of active infection that I can tell but again with increased pain that is definitely a concern here today. I do think it may be time to switch up the dressing as well to something that will be a little bit more effective hopefully and new tissue growth she has done well with collagen in the past. 09/05/2019 on evaluation today patient actually appears to be doing well with regard to her leg ulcer. She has been tolerating the dressing changes without complication. Fortunately there is no signs of active infection. We did obtain a wound culture last week due to the fact that she still is very touchy as far as the surface of the wound is concerned. Unfortunately I am not sure what happened to that culture as there is no record in the computer system. Nonetheless we need to follow-up on this and try to see if we can identify what exactly happened with this specimen as we have not gotten a result back either obviously. 09/12/2019 on evaluation today patient actually appears to be showing signs of good improvement upon evaluation today. She states her pain is not nearly as bad as what it was which is also good news. She did have a wound culture which was reviewed today that showed no growth of bacteria which is good news. This coupled with the fact that her wound appears to be healing better, measuring smaller, and overall even the slough buildup is minimal compared to what we have seen previously, I feel like she  is actually showing signs of excellent improvement today. 09/19/2019 on evaluation today patient appears to be doing well with regard to her wounds on evaluation today. She is showing signs  of continued improvement week by week and everything is measuring significantly smaller which is great news. Overall very pleased with the progress that she has made. 09/26/2019 on evaluation today patient appears to be doing fairly well with regard to her right lower extremity ulcer. She does continue to have some drainage and again the smaller of the 2 wounds is not making as much progress as I like to see. We have been doing the silver collagen for some time I think we may want to switch to [facility] she has a little bit of hyper granular tissue this may help to keep things from becoming too dramatic in that regard. Fortunately there is no signs of infection at this time. 10/03/2019 on evaluation today patient appears to be doing fairly well with regard to her lower extremity ulcers at this time. She has been tolerating the dressing changes without complication. Fortunately the wound is measuring smaller which is great news she continues to make great progress. 10/17/2019 on evaluation today patient appears to be doing well with regard to her wounds of her right lower extremity. She states she has been having some increased pain apparently we gave her a little bit smaller size Tubigrip last time she was here this may have been what was causing some issues. She is out of the Tubigrip that we had ordered for her and therefore did not have any of the smaller size. Fortunately there is no evidence of active infection at this time. No fever chills noted 10/24/2019 upon evaluation today patient's wounds actually appear to be showing signs of good improvement which is excellent news. She has been tolerating the dressing changes without complication and overall I am rather pleased at this point with how things seem to be progressing. The patient likewise is very happy. She still was not happy with having to use the Tubigrip she states that bunches up in her shoe. 10/31/2019 on evaluation today  patient actually appears to be doing quite well with regard to her leg ulcer. She has been tolerating the dressing changes without complication. Fortunately there is no signs of active infection at this time. Overall I am very pleased with the progress that she is made. She does wonder if we could switch to the bordered adhesive Hydrofera Blue I think that something we can definitely look into for her. 11/15/2019 patient on evaluation today appears to be making excellent progress. Her wound is significantly smaller and the wound bed appears to be dramatically improved compared to even the last visit. Fortunately she is having really little to no pain at this time. No fevers, chills, nausea, vomiting, or diarrhea. 11/25/2019 on evaluation today patient appears to be doing well with regard to her lower extremity ulcer. She has been tolerating dressing changes without complication. Fortunately there is no signs of active infection at this time. No fevers, chills, nausea, vomiting, or diarrhea. 12/02/2019 on evaluation today patient unfortunately is not doing quite as well as what we would have liked to have seen based on last week's evaluation. Fortunately there is no signs of active infection at this time. With that being said she is continuing to experience some issues with hyper granulation she was doing much better with the [facility] prior to putting the contact layer in between her skin and the [facility]  Blue I think that we need to go back to that. She just can have to be very careful with removing the dressing in order to ensure it does not pull skin and get stuck. 12/16/2019 on evaluation today patient appears to be doing better her wound is measuring significantly smaller at this time compared to where things were previous. Fortunately there is no evidence of active infection at this point which is also good news. No fever chills noted. 12/23/2019 upon evaluation today patient appears to be  doing excellent in regard to her lower extremity ulcer. She has been tolerating the dressing changes without complication. Fortunately there is no signs of active infection at this time. Overall I am very pleased with the fact that she is doing so well today. READMISSION 08/12/2020 [name] ([id number]) [name] is a now 88 year old woman we have had in this clinic on at least 2 occasions. She spent a protracted period of time here for a postsurgical wound on her left olecranon and then more recently towards the end of last year earlier this year was in for a skin tear on her left lower leg caused by her dog. She comes in today with a reasonably acute area on her left forearm again apparently caused by her dog although it was not a bite injury. She was seen in urgent care and they managed to reoppose a lot of the skin tear although the patient states that is now reopened slightly. They have been using Neosporin. She has very fragile skin probably a combination of chronic steroid use for rheumatoid arthritis [although I do not see that she is on that now] solar skin damage. I note that she takes methotrexate and Plaquenil for her rheumatoid arthritis 9/15; this patient had a skin tear on her left forearm from trauma with her dog. She was seen in urgent care and they reapposed most of the separated skin. Fortunately for her most of this is remained intact and the skin above and below the wound actually looks healthy. Where there was a separation and the 2 flaps of skin there is still an open area however the granulation tissue looks healthy here. We have been using Xeroform however I had like to change to [facility] 9/29; skin tear on her left arm from a dog scratch. The skin was reapposed in an urgent care and surprisingly stayed viable. We used Hydrofera Blue last time the wound is smaller 10/13; patient comes back to clinic after 2 weeks for a left arm wound secondary to a dog scratch.  The skin was reapposed and in urgent care. We have been using Hydrofera Blue and the wound is closed today. Readmission 02/17/2022 Ms. [name] is an 88 year old female with a past medical history of chronic venous insufficiency and lymphedema, COPD, rheumatoid arthritis and hypertension that presents with a 2-week history of wounds to her lower extremities bilaterally. She states she had a skin tag to the right posterior leg that came off and became a wound. She states the area has slowly gotten larger. She was seen by her primary care physician's office on 02/15/2022 for this issue. She was prescribed Bactroban and cefdinir. She has 2 other wounds 1 located to each leg. These were from trauma. She states one was from trying to put on compression stockings. Her ABIs in office on the left was 0.93 and 1.0 on the right. She currently denies systemic signs of infection. 02/24/2022 upon evaluation today patient appears to be doing  well currently in regard to her skin tears. In general I feel like she is making good progress. Fortunately I do not see any evidence of active infection locally or systemically which is great news. No fevers, chills, nausea, vomiting, or diarrhea. 03/04/2022 upon evaluation patient actually appears to be doing well in regard to the wounds on her legs. She has been tolerating the dressing changes without complication. Overall I think all are showing signs of improvement which is great news. 03/10/2022 upon evaluation today patient appears to be doing better in regard to her wounds in general. I am very pleased with where things stand. I do believe that she is showing signs of good improvement which is great news. No fevers, chills, nausea, vomiting, or diarrhea. 03-21-2022 upon evaluation today patient appears to be doing well with regard to her wound on the left leg and the anterior right leg both are showing signs of significant improvement. Unfortunately she has a lot of  slough and biofilm buildup on the surface of the posterior wound which is not good. I do not see any signs of active infection locally or systemically which is great news. No fevers, chills, nausea, vomiting, or diarrhea. 03-29-2022 upon evaluation today patient appears to be doing well currently in regard to her wounds. She has been tolerating the dressing changes without complication. Fortunately there does not appear to be any signs of active infection at this time which is great news. No fevers, chills, nausea, vomiting, or diarrhea. 4/27; right posterior calf and left lateral calf both initially traumatic in the setting of significant venous insufficiency. She did not tolerate compression wraps apparently because she could not get shoes on her feet and therefore we have been using Tubigrip's and silver alginate 5/8; right posterior calf 100% covered with adherent debris. The area on the left lateral really looks quite good under illumination islands of new epithelialization. We have been using Xeroform on the left Iodoflex on the right bilateral 2 layer Tubigrip's 04-28-2022 upon evaluation today patient appears to be doing well with regard to her wound she has been tolerating the dressing changes which is great news. Overall I feel like the Xeroform is doing well on the left leg this is pretty much healed on the right leg she is very close to being healed as well I think we can probably switch over to Xeroform here as well. Objective Constitutional Well-nourished and well-hydrated in no acute distress. Vitals Time Taken: 2:11 PM, Height: 60 in, Weight: 132 lbs, BMI: 25.8, Temperature: 98.1 F, Pulse: 70 bpm, Respiratory Rate: 18 breaths/min, Blood Pressure: 144/72 mmHg. Respiratory normal breathing without difficulty. Psychiatric this patient is able to make decisions and demonstrates good insight into disease process. Alert and Oriented x 3. pleasant and cooperative. General Notes:  Patient's wounds did not require any sharp debridement which is great news and overall I am extremely pleased with where we YAZMINE, SOREY ([id number]) stand at this point. I do believe that she is very close to complete resolution she does need to get her some compression socks however. Integumentary (Hair, Skin) Wound #13 status is Open. Original cause of wound was Trauma. The date acquired was: 02/09/2022. The wound has been in treatment 10 weeks. The wound is located on the Left Lower Leg. The wound measures 0.1cm length x 0.1cm width x 0.1cm depth; 0.008cm^2 area and 0.001cm^3 volume. There is Fat Layer (Subcutaneous Tissue) exposed. There is no tunneling or undermining noted. There is a small amount of serosanguineous  drainage noted. There is large (67-100%) granulation within the wound bed. There is no necrotic tissue within the wound bed. Wound #15 status is Open. Original cause of wound was Trauma. The date acquired was: 02/09/2022. The wound has been in treatment 10 weeks. The wound is located on the Right Lower Leg. The wound measures 0.9cm length x 0.6cm width x 0.1cm depth; 0.424cm^2 area and 0.042cm^3 volume. There is Fat Layer (Subcutaneous Tissue) exposed. There is no tunneling or undermining noted. There is a medium amount of serosanguineous drainage noted. There is small (1-33%) red granulation within the wound bed. There is a large (67-100%) amount of necrotic tissue within the wound bed including Adherent Slough. Assessment Active Problems ICD-10 Laceration without foreign body, left lower leg, initial encounter Laceration without foreign body, right lower leg, initial encounter Non-pressure chronic ulcer of other part of right lower leg with fat layer exposed Non-pressure chronic ulcer of other part of left lower leg with fat layer exposed Venous insufficiency (chronic) (peripheral) Plan Follow-up Appointments: Return Appointment in 1 week. Bathing/ Shower/ Hygiene: May  shower; gently cleanse wound with antibacterial soap, rinse and pat dry prior to dressing wounds No tub bath. Anesthetic (Use 'Patient Medications' Section for Anesthetic Order Entry): Lidocaine applied to wound bed Edema Control - Lymphedema / Segmental Compressive Device / Other: Tubigrip double layer applied - size c Elevate, Exercise Daily and Avoid Standing for Long Periods of Time. Elevate legs to the level of the heart and pump ankles as often as possible Elevate leg(s) parallel to the floor when sitting. Off-Loading: Turn and reposition every 2 hours - recommend prop legs/heels so wounds do not touch the bed/chair Additional Orders / Instructions: Follow Nutritious Diet and Increase Protein Intake WOUND #13: - Lower Leg Wound Laterality: Left Cleanser: Byram Ancillary Kit - 15 Day Supply (Generic) Every Other Day/30 Days Discharge Instructions: Use supplies as instructed; Kit contains: (15) Saline Bullets; (15) 3x3 Gauze; 15 pr Gloves Cleanser: Soap and Water Every Other Day/30 Days Discharge Instructions: Gently cleanse wound with antibacterial soap, rinse and pat dry prior to dressing wounds Primary Dressing: Xeroform-HBD 2x2 (in/in) (Generic) Every Other Day/30 Days Discharge Instructions: Apply Xeroform-HBD 2x2 (in/in) as directed Secondary Dressing: ABD Pad 5x9 (in/in) (Generic) Every Other Day/30 Days Discharge Instructions: Cover with ABD pad Secured With: Medipore Tape - 66M Medipore H Soft Cloth Surgical Tape, 2x2 (in/yd) (Generic) Every Other Day/30 Days Secured With: Kerlix Roll Sterile or Non-Sterile 6-ply 4.5x4 (yd/yd) Every Other Day/30 Days Discharge Instructions: Apply Kerlix as directed WOUND #15: - Lower Leg Wound Laterality: Right Cleanser: Byram Ancillary Kit - 15 Day Supply (Generic) Every Other Day/30 Days Discharge Instructions: Use supplies as instructed; Kit contains: (15) Saline Bullets; (15) 3x3 Gauze; 15 pr Gloves Cleanser: Soap and Water Every Other  Day/30 Days Discharge Instructions: Gently cleanse wound with antibacterial soap, rinse and pat dry prior to dressing wounds Primary Dressing: Xeroform-HBD 2x2 (in/in) Every Other Day/30 Days Discharge Instructions: Apply Xeroform-HBD 2x2 (in/in) as directed Secondary Dressing: ABD Pad 5x9 (in/in) (Generic) Every Other Day/30 Days Discharge Instructions: Cover with ABD pad Secured With: Medipore Tape - 66M Medipore H Soft Cloth Surgical Tape, 2x2 (in/yd) (Generic) Every Other Day/30 Days Secured With: Kerlix Roll Sterile or Non-Sterile 6-ply 4.5x4 (yd/yd) Every Other Day/30 Days Discharge Instructions: Apply Kerlix as directed JERMYA, DOWDING ([id number]) 1. I am good recommend that we going continue with the wound care measures as before and the patient is in agreement with plan. This includes the  use of the Xeroform gauze remember using this to both wound locations today. 2. I am also can recommend that we have the patient continue to monitor for any signs of worsening or infection. Obviously if anything changes she should let me know. We will see patient back for reevaluation in 1 week here in the clinic. If anything worsens or changes patient will contact our office for additional recommendations. Electronic Signature(s) Signed: 04/28/2022 5:34:49 PM By: Worthy Keeler PA-C Entered By: Worthy Keeler on 04/28/2022 17:34:49 [name] ([id number]) -------------------------------------------------------------------------------- SuperBill Details Patient Name: [name] Date of Service: 04/28/2022 Medical Record Number: [id number] Patient Account Number: [id number] Date of Birth/Sex: 29-Jun-1930 (88 y.o. F) Treating RN: Carlene Coria Primary Care Provider: Fulton Reek Other Clinician: Referring Provider: Fulton Reek Treating Provider/Extender: Skipper Cliche in Treatment: 10 Diagnosis Coding ICD-10 Codes Code Description [phone] Laceration without foreign body,  left lower leg, initial encounter S81.811A Laceration without foreign body, right lower leg, initial encounter L97.812 Non-pressure chronic ulcer of other part of right lower leg with fat layer exposed L97.822 Non-pressure chronic ulcer of other part of left lower leg with fat layer exposed I87.2 Venous insufficiency (chronic) (peripheral) Facility Procedures CPT4 Code: [id number] Description: 99213 - WOUND CARE VISIT-LEV 3 EST PT Modifier: Quantity: 1 Physician Procedures CPT4 Code: [id number] Description: 14709 - WC PHYS LEVEL 3 - EST PT Modifier: Quantity: 1 CPT4 Code: Description: ICD-10 Diagnosis Description S81.812A Laceration without foreign body, left lower leg, initial encounter S81.811A Laceration without foreign body, right lower leg, initial encounter L97.812 Non-pressure chronic ulcer of other part of right  lower leg with fat la L97.822 Non-pressure chronic ulcer of other part of left lower leg with fat lay Modifier: yer exposed er exposed Quantity: Electronic Signature(s) Signed: 04/28/2022 5:43:07 PM By: Worthy Keeler PA-C Previous Signature: 04/28/2022 4:24:09 PM Version By: Carlene Coria RN Entered By: Worthy Keeler on 04/28/2022 17:43:06

## 2022-05-05 ENCOUNTER — Encounter: Payer: Medicare Other | Admitting: Physician Assistant

## 2022-05-05 DIAGNOSIS — S81811A Laceration without foreign body, right lower leg, initial encounter: Secondary | ICD-10-CM | POA: Diagnosis not present

## 2022-05-05 NOTE — Progress Notes (Signed)
[name], [name] ([id number]) Visit Report for 05/05/2022 Arrival Information Details Patient Name: [name], [name]. Date of Service: 05/05/2022 12:30 PM Medical Record Number: [id number] Patient Account Number: [id number] Date of Birth/Sex: 07-16-1930 (88 y.o. F) Treating RN: Carlene Coria Primary Care Kanna Dafoe: Fulton Reek Other Clinician: Referring Flavius Repsher: Fulton Reek Treating Karstyn Birkey/Extender: Skipper Cliche in Treatment: 11 Visit Information History Since Last Visit All ordered tests and consults were completed: No Patient Arrived: Wheel Chair Added or deleted any medications: No Arrival Time: 12:35 Any new allergies or adverse reactions: No Accompanied By: self Had a fall or experienced change in No Transfer Assistance: None activities of daily living that may affect Patient Identification Verified: Yes risk of falls: Secondary Verification Process Completed: Yes Signs or symptoms of abuse/neglect since last visito No Patient Requires Transmission-Based Precautions: No Hospitalized since last visit: No Patient Has Alerts: Yes Implantable device outside of the clinic excluding No Patient Alerts: NOT diabetic cellular tissue based products placed in the center since last visit: Has Dressing in Place as Prescribed: Yes Pain Present Now: No Electronic Signature(s) Signed: 05/05/2022 2:19:12 PM By: Carlene Coria RN Entered By: Carlene Coria on 05/05/2022 12:43:21 [name] ([id number]) -------------------------------------------------------------------------------- Clinic Level of Care Assessment Details Patient Name: [name] Date of Service: 05/05/2022 12:30 PM Medical Record Number: [id number] Patient Account Number: [id number] Date of Birth/Sex: 14-Oct-1930 (88 y.o. F) Treating RN: Carlene Coria Primary Care Rashard Ryle: Fulton Reek Other Clinician: Referring Parnell Spieler: Fulton Reek Treating Derran Sear/Extender: Skipper Cliche in Treatment:  11 Clinic Level of Care Assessment Items TOOL 4 Quantity Score X - Use when only an EandM is performed on FOLLOW-UP visit 1 0 ASSESSMENTS - Nursing Assessment / Reassessment X - Reassessment of Co-morbidities (includes updates in patient status) 1 10 X- 1 5 Reassessment of Adherence to Treatment Plan ASSESSMENTS - Wound and Skin Assessment / Reassessment '[]'  - Simple Wound Assessment / Reassessment - one wound 0 X- 2 5 Complex Wound Assessment / Reassessment - multiple wounds '[]'  - 0 Dermatologic / Skin Assessment (not related to wound area) ASSESSMENTS - Focused Assessment '[]'  - Circumferential Edema Measurements - multi extremities 0 '[]'  - 0 Nutritional Assessment / Counseling / Intervention '[]'  - 0 Lower Extremity Assessment (monofilament, tuning fork, pulses) '[]'  - 0 Peripheral Arterial Disease Assessment (using hand held doppler) ASSESSMENTS - Ostomy and/or Continence Assessment and Care '[]'  - Incontinence Assessment and Management 0 '[]'  - 0 Ostomy Care Assessment and Management (repouching, etc.) PROCESS - Coordination of Care X - Simple Patient / Family Education for ongoing care 1 15 '[]'  - 0 Complex (extensive) Patient / Family Education for ongoing care '[]'  - 0 Staff obtains Programmer, systems, Records, Test Results / Process Orders '[]'  - 0 Staff telephones HHA, Nursing Homes / Clarify orders / etc '[]'  - 0 Routine Transfer to another Facility (non-emergent condition) '[]'  - 0 Routine Hospital Admission (non-emergent condition) '[]'  - 0 New Admissions / Biomedical engineer / Ordering NPWT, Apligraf, etc. '[]'  - 0 Emergency Hospital Admission (emergent condition) X- 1 10 Simple Discharge Coordination '[]'  - 0 Complex (extensive) Discharge Coordination PROCESS - Special Needs '[]'  - Pediatric / Minor Patient Management 0 '[]'  - 0 Isolation Patient Management '[]'  - 0 Hearing / Language / Visual special needs '[]'  - 0 Assessment of Community assistance (transportation, D/C planning,  etc.) '[]'  - 0 Additional assistance / Altered mentation '[]'  - 0 Support Surface(s) Assessment (bed, cushion, seat, etc.) INTERVENTIONS - Wound Cleansing / Measurement CELSEY, ASSELIN V. ([id number]) '[]'  - 0  Simple Wound Cleansing - one wound X- 2 5 Complex Wound Cleansing - multiple wounds X- 1 5 Wound Imaging (photographs - any number of wounds) '[]'  - 0 Wound Tracing (instead of photographs) '[]'  - 0 Simple Wound Measurement - one wound X- 2 5 Complex Wound Measurement - multiple wounds INTERVENTIONS - Wound Dressings X - Small Wound Dressing one or multiple wounds 2 10 '[]'  - 0 Medium Wound Dressing one or multiple wounds '[]'  - 0 Large Wound Dressing one or multiple wounds '[]'  - 0 Application of Medications - topical '[]'  - 0 Application of Medications - injection INTERVENTIONS - Miscellaneous '[]'  - External ear exam 0 '[]'  - 0 Specimen Collection (cultures, biopsies, blood, body fluids, etc.) '[]'  - 0 Specimen(s) / Culture(s) sent or taken to Lab for analysis '[]'  - 0 Patient Transfer (multiple staff / Civil Service fast streamer / Similar devices) '[]'  - 0 Simple Staple / Suture removal (25 or less) '[]'  - 0 Complex Staple / Suture removal (26 or more) '[]'  - 0 Hypo / Hyperglycemic Management (close monitor of Blood Glucose) '[]'  - 0 Ankle / Brachial Index (ABI) - do not check if billed separately X- 1 5 Vital Signs Has the patient been seen at the hospital within the last three years: Yes Total Score: 100 Level Of Care: New/Established - Level 3 Electronic Signature(s) Signed: 05/05/2022 2:19:12 PM By: Carlene Coria RN Entered By: Carlene Coria on 05/05/2022 13:07:28 [name] ([id number]) -------------------------------------------------------------------------------- Encounter Discharge Information Details Patient Name: [name] Date of Service: 05/05/2022 12:30 PM Medical Record Number: [id number] Patient Account Number: [id number] Date of Birth/Sex: 1930-01-22 (88 y.o. F) Treating RN:  Carlene Coria Primary Care Fitz Matsuo: Fulton Reek Other Clinician: Referring Dahlila Pfahler: Fulton Reek Treating Shiloh Southern/Extender: Skipper Cliche in Treatment: 11 Encounter Discharge Information Items Discharge Condition: Stable Ambulatory Status: Wheelchair Discharge Destination: Home Transportation: Private Auto Accompanied By: caregiver Schedule Follow-up Appointment: Yes Clinical Summary of Care: Electronic Signature(s) Signed: 05/05/2022 2:19:12 PM By: Carlene Coria RN Entered By: Carlene Coria on 05/05/2022 13:08:42 [name] ([id number]) -------------------------------------------------------------------------------- Lower Extremity Assessment Details Patient Name: [name] Date of Service: 05/05/2022 12:30 PM Medical Record Number: [id number] Patient Account Number: [id number] Date of Birth/Sex: 1930/11/27 (88 y.o. F) Treating RN: Carlene Coria Primary Care Carlus Stay: Fulton Reek Other Clinician: Referring Jeancarlos Marchena: Fulton Reek Treating Madelyn Tlatelpa/Extender: Skipper Cliche in Treatment: 11 Edema Assessment Assessed: [Left: No] [Right: No] Edema: [Left: Yes] [Right: Yes] Calf Left: Right: Point of Measurement: 32 cm From Medial Instep 32 cm 34 cm Ankle Left: Right: Point of Measurement: 10 cm From Medial Instep 22 cm 20 cm Vascular Assessment Pulses: Dorsalis Pedis Palpable: [Left:Yes] [Right:Yes] Electronic Signature(s) Signed: 05/05/2022 2:19:12 PM By: Carlene Coria RN Entered By: Carlene Coria on 05/05/2022 12:57:55 [name] ([id number]) -------------------------------------------------------------------------------- Multi Wound Chart Details Patient Name: [name] Date of Service: 05/05/2022 12:30 PM Medical Record Number: [id number] Patient Account Number: [id number] Date of Birth/Sex: 1930/10/02 (88 y.o. F) Treating RN: Carlene Coria Primary Care Napoleon Monacelli: Fulton Reek Other Clinician: Referring Briah Nary: Fulton Reek Treating Gerritt Galentine/Extender: Skipper Cliche in Treatment: 11 Vital Signs Height(in): 60 Pulse(bpm): 51 Weight(lbs): 132 Blood Pressure(mmHg): 156/66 Body Mass Index(BMI): 25.8 Temperature(F): 98.3 Respiratory Rate(breaths/min): 18 Photos: [N/A:N/A] Wound Location: Left Lower Leg Right Lower Leg N/A Wounding Event: Trauma Trauma N/A Primary Etiology: Skin Tear Venous Leg Ulcer N/A Comorbid History: Cataracts, Glaucoma, Optic Cataracts, Glaucoma, Optic N/A Neuritis, Chronic sinus Neuritis, Chronic sinus problems/congestion, Middle ear problems/congestion, Middle ear problems, Hypertension, problems, Hypertension,  Rheumatoid Arthritis Rheumatoid Arthritis Date Acquired: 02/09/2022 02/09/2022 N/A Weeks of Treatment: 11 11 N/A Wound Status: Open Open N/A Wound Recurrence: No No N/A Measurements L x W x D (cm) 3x3.5x0.1 0.7x0.5x0.1 N/A Area (cm) : 8.247 0.275 N/A Volume (cm) : 0.825 0.027 N/A % Reduction in Area: -438.30% 97.70% N/A % Reduction in Volume: -439.20% 97.70% N/A Classification: Full Thickness Without Exposed Full Thickness Without Exposed N/A Support Structures Support Structures Exudate Amount: Small Medium N/A Exudate Type: Serosanguineous Serosanguineous N/A Exudate Color: red, brown red, brown N/A Granulation Amount: Large (67-100%) Small (1-33%) N/A Granulation Quality: N/A Red N/A Necrotic Amount: None Present (0%) Large (67-100%) N/A Exposed Structures: Fat Layer (Subcutaneous Tissue): Fat Layer (Subcutaneous Tissue): N/A Yes Yes Fascia: No Fascia: No Tendon: No Tendon: No Muscle: No Muscle: No Joint: No Joint: No Bone: No Bone: No Epithelialization: None None N/A Treatment Notes Electronic Signature(s) Signed: 05/05/2022 2:19:12 PM By: Carlene Coria RN Entered By: Carlene Coria on 05/05/2022 12:58:18 [name] ([id number]) [name]  ([id number]) -------------------------------------------------------------------------------- [place] Details Patient Name: [name] Date of Service: 05/05/2022 12:30 PM Medical Record Number: [id number] Patient Account Number: [id number] Date of Birth/Sex: May 28, 1930 (88 y.o. F) Treating RN: Carlene Coria Primary Care Tauheedah Bok: Fulton Reek Other Clinician: Referring Bryden Darden: Fulton Reek Treating Johnavon Mcclafferty/Extender: Skipper Cliche in Treatment: 11 Active Inactive Wound/Skin Impairment Nursing Diagnoses: Knowledge deficit related to ulceration/compromised skin integrity Goals: Patient/caregiver will verbalize understanding of skin care regimen Date Initiated: 02/17/2022 Date Inactivated: 03/21/2022 Target Resolution Date: 03/20/2022 Goal Status: Met Ulcer/skin breakdown will have a volume reduction of 30% by week 4 Date Initiated: 02/17/2022 Date Inactivated: 03/21/2022 Target Resolution Date: 03/20/2022 Goal Status: Met Ulcer/skin breakdown will have a volume reduction of 50% by week 8 Date Initiated: 02/17/2022 Target Resolution Date: 04/19/2022 Goal Status: Active Ulcer/skin breakdown will have a volume reduction of 80% by week 12 Date Initiated: 02/17/2022 Target Resolution Date: 05/20/2022 Goal Status: Active Ulcer/skin breakdown will heal within 14 weeks Date Initiated: 02/17/2022 Target Resolution Date: 06/19/2022 Goal Status: Active Interventions: Assess patient/caregiver ability to obtain necessary supplies Assess patient/caregiver ability to perform ulcer/skin care regimen upon admission and as needed Assess ulceration(s) every visit Notes: Electronic Signature(s) Signed: 05/05/2022 2:19:12 PM By: Carlene Coria RN Entered By: Carlene Coria on 05/05/2022 12:58:02 [name] ([id number]) -------------------------------------------------------------------------------- Pain Assessment Details Patient Name: [name] Date of Service:  05/05/2022 12:30 PM Medical Record Number: [id number] Patient Account Number: [id number] Date of Birth/Sex: 09-10-1930 (88 y.o. F) Treating RN: Carlene Coria Primary Care Felma Pfefferle: Fulton Reek Other Clinician: Referring Shelli Portilla: Fulton Reek Treating Weronika Birch/Extender: Skipper Cliche in Treatment: 11 Active Problems Location of Pain Severity and Description of Pain Patient Has Paino No Site Locations Pain Management and Medication Current Pain Management: Electronic Signature(s) Signed: 05/05/2022 2:19:12 PM By: Carlene Coria RN Entered By: Carlene Coria on 05/05/2022 12:44:29 [name] ([id number]) -------------------------------------------------------------------------------- Patient/Caregiver Education Details Patient Name: [name] Date of Service: 05/05/2022 12:30 PM Medical Record Number: [id number] Patient Account Number: [id number] Date of Birth/Gender: 29-Feb-1930 (88 y.o. F) Treating RN: Carlene Coria Primary Care Physician: Fulton Reek Other Clinician: Referring Physician: Fulton Reek Treating Physician/Extender: Skipper Cliche in Treatment: 11 Education Assessment Education Provided To: Patient Education Topics Provided Wound/Skin Impairment: Methods: Explain/Verbal Responses: State content correctly Electronic Signature(s) Signed: 05/05/2022 2:19:12 PM By: Carlene Coria RN Entered By: Carlene Coria on 05/05/2022 13:07:45 [name] ([id number]) -------------------------------------------------------------------------------- Wound Assessment Details Patient Name: [name] Date of Service:  05/05/2022 12:30 PM Medical Record Number: [id number] Patient Account Number: [id number] Date of Birth/Sex: [birth year] (88 y.o. F) Treating RN: Carlene Coria Primary Care Jayceion Lisenby: Fulton Reek Other Clinician: Referring Stassi Fadely: Fulton Reek Treating Jasiel Belisle/Extender: Skipper Cliche in Treatment: 11 Wound Status Wound Number:  13 Primary Skin Tear Etiology: Wound Location: Left Lower Leg Wound Open Wounding Event: Trauma Status: Date Acquired: 02/09/2022 Comorbid Cataracts, Glaucoma, Optic Neuritis, Chronic sinus Weeks Of Treatment: 11 History: problems/congestion, Middle ear problems, Hypertension, Clustered Wound: No Rheumatoid Arthritis Photos Wound Measurements Length: (cm) 3 Width: (cm) 3.5 Depth: (cm) 0.1 Area: (cm) 8.247 Volume: (cm) 0.825 % Reduction in Area: -438.3% % Reduction in Volume: -439.2% Epithelialization: None Tunneling: No Undermining: No Wound Description Classification: Full Thickness Without Exposed Support Structures Exudate Amount: Small Exudate Type: Serosanguineous Exudate Color: red, brown Foul Odor After Cleansing: No Slough/Fibrino No Wound Bed Granulation Amount: Large (67-100%) Exposed Structure Necrotic Amount: None Present (0%) Fascia Exposed: No Fat Layer (Subcutaneous Tissue) Exposed: Yes Tendon Exposed: No Muscle Exposed: No Joint Exposed: No Bone Exposed: No Treatment Notes Wound #13 (Lower Leg) Wound Laterality: Left Cleanser Byram Ancillary Kit - 15 Day Supply Discharge Instruction: Use supplies as instructed; Kit contains: (15) Saline Bullets; (15) 3x3 Gauze; 15 pr Gloves Soap and [address] NADEEN, SHIPMAN ([id number]) Discharge Instruction: Gently cleanse wound with antibacterial soap, rinse and pat dry prior to dressing wounds Peri-Wound Care Topical Primary Dressing Xeroform-HBD 2x2 (in/in) Discharge Instruction: Apply Xeroform-HBD 2x2 (in/in) as directed Secondary Dressing ABD Pad 5x9 (in/in) Discharge Instruction: Cover with ABD pad Secured With Medipore Tape - 39M Medipore H Soft Cloth Surgical Tape, 2x2 (in/yd) Kerlix Roll Sterile or Non-Sterile 6-ply 4.5x4 (yd/yd) Discharge Instruction: Apply Kerlix as directed Compression Wrap Compression Stockings Add-Ons Electronic Signature(s) Signed: 05/05/2022 2:19:12 PM By: Carlene Coria  RN Entered By: Carlene Coria on 05/05/2022 12:56:18 [name] ([id number]) -------------------------------------------------------------------------------- Wound Assessment Details Patient Name: [name] Date of Service: 05/05/2022 12:30 PM Medical Record Number: [id number] Patient Account Number: [id number] Date of Birth/Sex: January 12, 1930 (88 y.o. F) Treating RN: Carlene Coria Primary Care Ravinder Lukehart: Fulton Reek Other Clinician: Referring Jalea Bronaugh: Fulton Reek Treating Mehar Sagen/Extender: Skipper Cliche in Treatment: 11 Wound Status Wound Number: 15 Primary Venous Leg Ulcer Etiology: Wound Location: Right Lower Leg Wound Open Wounding Event: Trauma Status: Date Acquired: 02/09/2022 Comorbid Cataracts, Glaucoma, Optic Neuritis, Chronic sinus Weeks Of Treatment: 11 History: problems/congestion, Middle ear problems, Hypertension, Clustered Wound: No Rheumatoid Arthritis Photos Wound Measurements Length: (cm) 0.7 Width: (cm) 0.5 Depth: (cm) 0.1 Area: (cm) 0.275 Volume: (cm) 0.027 % Reduction in Area: 97.7% % Reduction in Volume: 97.7% Epithelialization: None Tunneling: No Undermining: No Wound Description Classification: Full Thickness Without Exposed Support Structures Exudate Amount: Medium Exudate Type: Serosanguineous Exudate Color: red, brown Foul Odor After Cleansing: No Slough/Fibrino Yes Wound Bed Granulation Amount: Small (1-33%) Exposed Structure Granulation Quality: Red Fascia Exposed: No Necrotic Amount: Large (67-100%) Fat Layer (Subcutaneous Tissue) Exposed: Yes Necrotic Quality: Adherent Slough Tendon Exposed: No Muscle Exposed: No Joint Exposed: No Bone Exposed: No Treatment Notes Wound #15 (Lower Leg) Wound Laterality: Right Cleanser Byram Ancillary Kit - 15 Day Supply Discharge Instruction: Use supplies as instructed; Kit contains: (15) Saline Bullets; (15) 3x3 Gauze; 15 pr Gloves Soap and [address] KEYLI, DUROSS  ([id number]) Discharge Instruction: Gently cleanse wound with antibacterial soap, rinse and pat dry prior to dressing wounds Peri-Wound Care Topical Primary Dressing Xeroform-HBD 2x2 (in/in) Discharge Instruction: Apply Xeroform-HBD 2x2 (in/in) as directed Secondary Dressing ABD Pad 5x9 (in/in)  Discharge Instruction: Cover with ABD pad Secured With [place] H Soft Cloth Surgical Tape, 2x2 (in/yd) Kerlix Roll Sterile or Non-Sterile 6-ply 4.5x4 (yd/yd) Discharge Instruction: Apply Kerlix as directed Compression Wrap Compression Stockings Add-Ons Electronic Signature(s) Signed: 05/05/2022 2:19:12 PM By: Carlene Coria RN Entered By: Carlene Coria on 05/05/2022 12:56:38 [name] ([id number]) -------------------------------------------------------------------------------- [place] Details Patient Name: [name] Date of Service: 05/05/2022 12:30 PM Medical Record Number: [id number] Patient Account Number: [id number] Date of Birth/Sex: 1930/04/17 (88 y.o. F) Treating RN: Carlene Coria Primary Care Jona Zappone: Fulton Reek Other Clinician: Referring Sasha Rogel: Fulton Reek Treating Doyel Mulkern/Extender: Skipper Cliche in Treatment: 11 Vital Signs Time Taken: 12:43 Temperature (F): 98.3 Height (in): 60 Pulse (bpm): 66 Weight (lbs): 132 Respiratory Rate (breaths/min): 18 Body Mass Index (BMI): 25.8 Blood Pressure (mmHg): 156/66 Reference Range: 80 - 120 mg / dl Electronic Signature(s) Signed: 05/05/2022 2:19:12 PM By: Carlene Coria RN Entered By: Carlene Coria on 05/05/2022 12:44:01

## 2022-05-05 NOTE — Progress Notes (Addendum)
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 today patient seems to be doing fairly well in regard to her right lower extremity ulcer. She has been tolerating the dressing changes without complication. With that being said she does note that she's been having a lot of burning with the current dressing which is specifically the Iodoflex. Obviously this is a known side effect of the iodine in the dressing and I believe that may be giving her trouble. No fevers, chills,  nausea, or vomiting noted at this time. Otherwise the wound does appear to be doing well. 01/30/18 on evaluation today patient appears to be doing well in regard to her right anterior lower extremity ulcer. She notes that this does seem to be smaller and she wonders why we did not start the Prisma dressing sooner since it has made such a big difference in such a short amount of time. I explained that obviously we have to wait for the wound to get to a certain point along his healing path before we can initiate the Prisma otherwise it will not be effective. Therefore once the wound became clean it was then time to initiate the Prisma. Nonetheless good news is she is noting excellent improvement she does still have some discomfort but nothing as significant as previously noted. 04/17/18 on evaluation today patient appears to be doing very well and in fact her right lower extremity ulcer has completely healed at this point I'm pleased with this. The left lower extremity ulcer seem to be doing better although she still does have some openings noted the Prisma I think is helping more than the Xeroform was in my pinion. With that being said she still has a lot of healing to do in this regard. 04/27/18 on evaluation today patient appears to be doing very well in regard to her left lower Trinity ulcers. She has been tolerating the dressing changes without complication. I do have a note from her orthopedic surgeon today and they would like for me to help with treating her left elbow surgery site where she had the bursa removed and this was performed roughly 4 weeks ago according to the note that I reviewed. She has been placed on Bactrim DS by need for her leg wounds this probably helped a little bit with the left elbow surgery site. Obviously I do think this is something we can try to help her out with. 05/04/18 on evaluation today patient appears to be doing well in regard to her left anterior lower Trinity  ulcers. She is making good  progress which is great news. Unfortunately her elbow which we are also managing at this point in time has not made as much progress unfortunately. She has been tolerating the dressing changes without complication. She did see Dr. Benson Setting earlier today and he states that he's willing to give this three weeks to see if she's making any progress with wound care. However he states that she's really not then he will need to go back in and perform PUJA, CAFFEY V. ([id number]) further surgery. Obviously she is trying to avoid surgery if at all possible although I'm not sure if this is going to be possible or least not that quickly. 05/11/18 on evaluation today patient appears to be doing very well in regard to her left lower extremity ulcers. Unfortunately in regard to her elbow this is very slow coming about as far as any improvement is concerned. I do feel like there may be a little bit more granulation noted in the base of the wound but nothing too significant unfortunately. I still can probe bone in the proximal portion of the wound which obviously explain to the patient is not good. She will be having a follow-up with her orthopedic surgeon in the next couple of weeks. In the meantime we are trying to do as much as we can to try to show signs of improvement in healing to avoid the need for any additional and further surgery. Nonetheless I explained to the patient yet again today I'm not sure if that is going to be feasible or not obviously it's more risk for her to continue to have an open wound with bone exposure then to the back in for additional surgery even though I know she doesn't want to go that route. 05/15/18 on evaluation today patient presents for follow-up concerning her ongoing lower extremity ulcers on the left as well as the left elbow ulcer. She has at this point in time been tolerating the dressing changes without complication. Her left lower extremity ulcer  appears to be doing very well. In regard to the left elbow ulcer she actually does seem to have additional granulation today which is good news. I am definitely seeing signs of improvement although obviously this is somewhat slow improvement. Nonetheless I'm hopeful we will be able to avoid her having to have any further surgery but again that would definitely be a conversation between herself as well as her surgeon once he sees her for reevaluation. Otherwise she does want to see about having a three order compression stockings for her today 05/21/18 on evaluation today patient appears to be doing well in regard to her left lower surety ulcer. This is almost completely healed and seems to be progressing very nicely. With that being said her left elbow is another story. I'm not really convinced in the past three weeks we've seen a significant improvement in this wound. With that being said if this is something that there is no surgical option for him we have to continue to work on this from the standpoint of conservative management with wound care she may make improvement given time. Nonetheless it appears that her surgeon is somewhat concerned about the possibility of infection and really is leaning towards additional surgery to try and help close this wound. Nonetheless the patient is still unsure of exactly what to do. 05/29/18 on evaluation today patient appears to be doing well in regard to her left lower extremity ulcer. She's been tolerating the dressing changes without complication which is good  news. With that being said she's been having issues specifically with her elbow she did see her surgeon Dr. Roland Rack and he is recommending a repeat surgery to the left elbow in order to correct the issue. The patient is still somewhat unsure of this but feels like this may be better than trying to take time to let this heal over a longer period of time through normal wound care measures. Again I explained  that I agree this may be a faster way to go if her surgeon feels that this is indeed a good direction to take. Obviously only he can make the judgment on whether or not the surgery would likely be successful. 06/04/18 on evaluation today patient actually presents for follow-up concerning her left elbow and left lower from the ulcer she seems to be doing very well at this point in time. She has been tolerating the dressing changes without complication. With that being said her elbow is not significantly better she actually is scheduled for surgery tomorrow. 07/04/18; the patient had an area on her left leg that is remaining closed. The open area she has now is a postsurgical wound on the left elbow. I think we have clearance from the surgeon to see this now. We're using Prisma 07/11/18; we're currently dealing with a surgical wound on the left olecranon process. The patient complains of a lot of pain and drainage. When I saw her last week we did an x-ray that showed soft tissue wound and probable elbow joint effusion but no erosion to suggest osteomyelitis. The culture I did of this was somewhat surprisingly negative. She has a small open wound with not a viable surface there is considerable undermining relative to the wound size. She is on methotrexate for rheumatoid arthritis/overlap syndrome also plaquenil. We've been using silver collagen 07/18/18-She is seen in follow-up evaluation for a left elbow wound. There is essentially no change. She is currently on Zithromax and will complete that on Friday, there is no indication to extend this. We will change to iodosorb/iodoflex and monitor for response 07/25/18-She is seen in follow-up evaluation for left elbow wound. The wound is stable with no overt evidence of infection. She has counseled with her rheumatologist. She is wanting to restart her methotrexate; a culture was obtained to rule out occult infection before starting her methotrexate. We will  continue with Iodosorb/Iodoflex and she will follow-up next week. 08/01/18; this is a difficult wound over her left olecranon process. There is been concerned about infection although cultures including one done last week were negative. Pending 3 weeks ago I gave her an empiric course of antibiotics. She is having a lot of rheumatologic pain in her hands with pain and stiffness. She wants to go on her weekly methotrexate and I think it would be reasonable to do so. We have been using Iodoflex 08/01/18; difficult wound over her left olecranon process. She started back on methotrexate last week because of rheumatologic pain in her hands. We have been using Iodoflex to try and clean out the wound bed. She has been approved for Graphix PL 08/15/18; 2 week follow-up. Difficult wound over her left olecranon process. Graphix PL #1 with collagen backing 08/22/18; one-week follow-up. Difficult wound over her left olecranon process. Graphix PL #2 08/29/18; no major improvement. Difficult wound over her left olecranon process. Still considerable undermining. Graphics PL #3 1 week follow-up. Graphix #4 09/12/18 graphics #5. Some improvement in wound area although the undermining superiorly still has not closed down as  much as I would like 09/19/18; Graphix #6 I think there is improvement in the undermining from 7 to 9:00. Wound bed looks healthy. 09/26/18 Graffix #7 undermining is 0.5 cm maximally at roughly 8:00. From 12 to 7:00 the tissue is adherent which is a major improvement there is some advancing skin from this side. 10/03/18; Graphix #8 no major changes from last week 10/10/18 Graffix #9 There are improvements. There appears to be granulation coming up to the surface here and there is a lot less undermining at 8:00. 10/17/18. Graffix #10; Dimensions are improved less undermining surface felt the but the wound is still open. Initially a surgical wound following a bursectomy 10/24/18; Graffix #11. This is  really stalled over the last 2 weeks. If there is no further improvement this will be the last application.The final option for this difficult area would be plastic surgery and will set up a consult with Dr. Lorayne Bender in [facility] 10/31/18; wound looks about the same. The undermining superiorly is 0.7 cm. On the lateral edges perhaps some improvement there is no drainage. 11-07-2018 patient seen today for follow-up and management of left elbow wound. She has completed a total of 11 treatments of the graffix with not much improvement. She has an upcoming appointment with plastic surgery to assist with additional treatment options for the left elbow wound on 11/19/18. There is significant amount surrounding undermining of the wound is 0.9 cm. Currently prescribed methotrexate. Wound is being treated with Indoform and border dressing. No drainage from wound. No fever, chills. or pain. 11/21/18; the patient continues to have the wound looking roughly the same with undermining from about 12 to 6:00. This has not changed all that much. She does have skin irritation around the wound that looks like drainage maceration issues. The patient states that she was not able to have her wound dressing changed because of illness in the person he usually does this. She also did not attend her clinic appointment today with Dr. Lorayne Bender because of transportation issues. She is rebooked for some time in mid January 11/28/2018; the patient has less undermining using endoform. As a understandings she saw Dr. Doy Mince who is Dr. Rebekah Chesterfield partner. He recommended putting her in a elbow brace and I believe is written a prescription for it. He also recommended Motrin 800 mg 3 times daily. This is prescription strength ibuprofen although he did not write his prescription. This apparently was for 2 weeks. Culture I did last time grew a few [name], [name] ([id number]) methicillin sensitive staph aureus. After some difficulty  due to drug intolerances/allergies and drug interactions I settled on a 5-day course of azithromycin 12/03/18 on evaluation today patient actually appears to be doing fairly well in regard to her elbow when compared to last time I evaluated her. With that being said there does not appear to be any signs of infection at this time. That was the big concern currently as far as the patient was concerned. Nonetheless I do feel like she is making progress in regard to the feeling of this ulcer it has been slow. She did see a Psychiatric nurse they are talking about putting her in a brace in order to allow this area to heal more appropriately. 12/19/2018; not much change in this from the last time I have saw this.'s much smaller area than when she first came in and with less circumferential undermining however this is never really adhered. She is wearing the brace that was given or prescribed to her  by plastics. She did not have a procedure offered to attempt to close this. We have been using endoform 1/15; wound actually is not doing as well as last week. She was actually not supposed to come into this clinic again until next week but apparently her attendant noticed some redness increasing pain and she came in early. She reports the same amount of drainage. We have been using endoform. She is approved through puraply however I will only consider starting that next week 1/22; she completed the antibiotics last week. Culture I did was negative. In spite of this there is less erythema and pain complaints in the wound. Puraply #1 applied today 1/29; Puraply #2 today. Wound surface looks a lot better post debridement of adherent fibrinous material. However undermining from 6-12 is measuring worse 2/5; Puraply #3. Using her elbow brace 2/12 puraply #4 2/19 puraply #5. The 9:00 undermining measured at 0.5 cm. Undermining from 4-11 o'clock. Surface of the wound looks better and the circumference of the wound is  smaller however the undermining is not really changed 2/26; still not much improvement. She has undermining from 4-9 o'clock 0.9 cm. Surface of the wound covered and adherent debris. 3/4; still no improvement. Undermining from 4-9 o'clock still around a centimeter. Surface of the wound looks somewhat better. No debridement is required we used endoform after we ended the trial of puraply last week 3/11; really no improvement at all. Still 1 cm undermining from roughly 9-3 o'clock. This is about a centimeter. The base of the wound looks fairly healthy. No debridement. We have been using endoform. I am really out of most usual options here. I could consider either another round of an amniotic advanced treatment product example epifix or perhaps regranex. Understandably the patient is a bit frustrated. We did send her to plastic surgery for a consult. Other than prescribing her a brace to immobilize the elbow they did not think she was a candidate for any further surgery. Notable that the patient is not using the brace today 3/18-Patient returns for attention to the left elbow area which apparently looked red at the home health visit. Patient's elbow looks the same if not better compared with last visit. The area of ulceration remains the same, the base appears healthy. We are continuing to use endoform she has been encouraged to use the brace to keep the elbow straight 3/25; the patient has an appointment at the [facility] wound care center on 4/2. I had actually put her out indefinitely however she seems to want to come back here every week. This week she complains of increased pain and malodor. Dimensions of the elbow wound are larger. We have been using endoform 4/8; the patient went to [facility] where they apparently gave her meta honey and some border foam with a Tubigrip. She has been using this for a week. She says she was very impressed with them there. They did not offer her any surgical consultation. She  seems to be coming back here for a second opinion on this, she does not wish to drive to [facility] every week 4/22; still using Medihoney foam border and a Tubigrip. Actually do not think she has anything on the arm at all specifically she is not using her brace 5/6; she has been using medihoney without a lot of improvement. Undermining maximum at 9:00 at 0.5 cm may be somewhat better. She is still complaining of discomfort. She uses her elbow brace at night but is not using anything on the  arm during the day, she finds it too restrictive 5/13; we switch the patient to endoform AG last week. She is complaining of more pain and worried about some circumferential erythema around the wound. I had planned to consider epifix in this wound however the patient came in with a request to see a plastic surgeon in [place] by the name of Dr. Harl Bowie who cared for a friend of hers. Noteworthy that I have already sent her to one plastic surgeon and she went for another second opinion at [facility] and they apparently did not send her to a plastic surgeon nevertheless the patient is fairly convinced that she might benefit from a skin graft which I am doubtful. She also has rheumatoid arthritis and is on methotrexate. This was originally a surgical wound for a bursectomy a year or 2 ago Readmission: 06/28/19 on evaluation today patient presents today for reevaluation but this is due to a new issue her elbow has completely healed and looks excellent. Her right anterior lower leg has a skin tear which was sustained from her dog who jumped up on her and inadvertently scratch the area causing the skin tear. This happened yesterday. She is having some discomfort but fortunately nothing too significant which is good news. No fevers, chills, nausea, or vomiting noted at this time. The skin fortunately was knocked one completely off and we are gonna see about we approximate in the skin as best we can in using Steri-Strips to hold  this in place obviously if we can get some of this to reattach that would be beneficial 07/05/2019 on evaluation today patient actually appears to be doing okay although unfortunately the skin flap that we were attempting to [place] down last week did not take. She is developing a lot of fluid underneath the wound area unfortunately which again is not ideal. I think that this necrotic tissue needs to be removed and again it actually just wiped off during the evaluation today as I was attempting to clean the wound there did not appear to be any significant issues underlying which is good although there was some purulent drainage I did want to go ahead and see about obtaining a culture from today in order to ensure that the Z-Pak that I placed her on earlier in the week was appropriate for treating what ever infection may be causing the issue currently. This was a deep wound culture obtained today. 07/12/2019 on evaluation today patient actually appears to be doing quite well with regard to her right lower extremity ulcer all things considering. There is still little bit of hematoma not it that is noted in the central portion of the wound along with some necrotic tissue but she is still having quite a bit of discomfort. For that reason I did not perform sharp debridement today although this wound does need some debridement of one type or another. I think we may attempt Iodoflex to see if this can be of benefit. Fortunately there is no signs of active infection at this point. 07/18/19 upon evaluation today patient appears to be doing better with regard to her ulcer on her right lower extremity. She's been tolerating the dressing changes without complication. The good news is she seems to be making excellent progress. Overall I'm pleased with there being no signs of infection. With that being said she does tell me that she's been having some discomfort with the wrap she's unsure of exactly what  about the rafters causing her trouble. 07/25/2019 on  evaluation today patient appears to be doing a little better in regard to her lower extremity ulcer. Unfortunately the alginate seems to be getting really stuck to the wound bed and surrounding periwound. Fortunately there is no signs of active infection at this time. There does not appear to be any evidence of infection currently. 08/01/2019 on evaluation today patient actually appears to be doing much better with regard to her right lower extremity ulcer. She has been Moorcroft, Prichard V. ([id number]) tolerating the dressing changes without complication. Fortunately her wound does not show any signs of infection and seems to be making good progress. No fevers, chills, nausea, vomiting, or diarrhea. 08/08/2019 on evaluation today patient actually appears to be doing better with regard to her leg ulcer. She has been tolerating the dressing changes without complication. With that being said I think we may want to switch the dressing up today just based on the appearance of the wound bed in general. Fortunately there is no evidence of infection currently. Her pain seems to be much better. Her son is present with her today. 08/15/2019 on evaluation today patient appears to be doing very well with regard to her right lower extremity ulcer. She still complains about feeling like the compression stocking/Tubigrip is too tight for her. Nonetheless I still think this is beneficial and is helping the wound to heal more effectively. Overall I am extremely pleased at this time with what I am seeing. 08/22/2019 on evaluation today patient actually appears to be doing quite well with regard to her right lower extremity. She has been tolerating the dressing changes without complication. Fortunately there is no signs of active infection at this time. She still complains about the Tubigrip but nonetheless I think this is something that is of utmost importance for her to  continue to wear if she is going to see this area heal appropriately. 08/29/2019 on evaluation today patient actually appears to be doing well visually in regard to her wound. Unfortunately in regard to overall pain she seems to be having more pain today which I am somewhat concerned about. There does not appear to be any signs of active infection that I can tell but again with increased pain that is definitely a concern here today. I do think it may be time to switch up the dressing as well to something that will be a little bit more effective hopefully and new tissue growth she has done well with collagen in the past. 09/05/2019 on evaluation today patient actually appears to be doing well with regard to her leg ulcer. She has been tolerating the dressing changes without complication. Fortunately there is no signs of active infection. We did obtain a wound culture last week due to the fact that she still is very touchy as far as the surface of the wound is concerned. Unfortunately I am not sure what happened to that culture as there is no record in the computer system. Nonetheless we need to follow-up on this and try to see if we can identify what exactly happened with this specimen as we have not gotten a result back either obviously. 09/12/2019 on evaluation today patient actually appears to be showing signs of good improvement upon evaluation today. She states her pain is not nearly as bad as what it was which is also good news. She did have a wound culture which was reviewed today that showed no growth of bacteria which is good news. This coupled with the fact that her wound appears  to be healing better, measuring smaller, and overall even the slough buildup is minimal compared to what we have seen previously, I feel like she is actually showing signs of excellent improvement today. 09/19/2019 on evaluation today patient appears to be doing well with regard to her wounds on evaluation today. She is  showing signs of continued improvement week by week and everything is measuring significantly smaller which is great news. Overall very pleased with the progress that she has made. 09/26/2019 on evaluation today patient appears to be doing fairly well with regard to her right lower extremity ulcer. She does continue to have some drainage and again the smaller of the 2 wounds is not making as much progress as I like to see. We have been doing the silver collagen for some time I think we may want to switch to [facility] she has a little bit of hyper granular tissue this may help to keep things from becoming too dramatic in that regard. Fortunately there is no signs of infection at this time. 10/03/2019 on evaluation today patient appears to be doing fairly well with regard to her lower extremity ulcers at this time. She has been tolerating the dressing changes without complication. Fortunately the wound is measuring smaller which is great news she continues to make great progress. 10/17/2019 on evaluation today patient appears to be doing well with regard to her wounds of her right lower extremity. She states she has been having some increased pain apparently we gave her a little bit smaller size Tubigrip last time she was here this may have been what was causing some issues. She is out of the Tubigrip that we had ordered for her and therefore did not have any of the smaller size. Fortunately there is no evidence of active infection at this time. No fever chills noted 10/24/2019 upon evaluation today patient's wounds actually appear to be showing signs of good improvement which is excellent news. She has been tolerating the dressing changes without complication and overall I am rather pleased at this point with how things seem to be progressing. The patient likewise is very happy. She still was not happy with having to use the Tubigrip she states that bunches up in her shoe. 10/31/2019 on  evaluation today patient actually appears to be doing quite well with regard to her leg ulcer. She has been tolerating the dressing changes without complication. Fortunately there is no signs of active infection at this time. Overall I am very pleased with the progress that she is made. She does wonder if we could switch to the bordered adhesive Hydrofera Blue I think that something we can definitely look into for her. 11/15/2019 patient on evaluation today appears to be making excellent progress. Her wound is significantly smaller and the wound bed appears to be dramatically improved compared to even the last visit. Fortunately she is having really little to no pain at this time. No fevers, chills, nausea, vomiting, or diarrhea. 11/25/2019 on evaluation today patient appears to be doing well with regard to her lower extremity ulcer. She has been tolerating dressing changes without complication. Fortunately there is no signs of active infection at this time. No fevers, chills, nausea, vomiting, or diarrhea. 12/02/2019 on evaluation today patient unfortunately is not doing quite as well as what we would have liked to have seen based on last week's evaluation. Fortunately there is no signs of active infection at this time. With that being said she is continuing to experience some issues  with hyper granulation she was doing much better with the [facility] prior to putting the contact layer in between her skin and the [facility] I think that we need to go back to that. She just can have to be very careful with removing the dressing in order to ensure it does not pull skin and get stuck. 12/16/2019 on evaluation today patient appears to be doing better her wound is measuring significantly smaller at this time compared to where things were previous. Fortunately there is no evidence of active infection at this point which is also good news. No fever chills noted. 12/23/2019 upon evaluation today patient  appears to be doing excellent in regard to her lower extremity ulcer. She has been tolerating the dressing changes without complication. Fortunately there is no signs of active infection at this time. Overall I am very pleased with the fact that she is doing so well today. READMISSION 08/12/2020 [name] ([id number]) Mrs. Rogness is a now 88 year old woman we have had in this clinic on at least 2 occasions. She spent a protracted period of time here for a postsurgical wound on her left olecranon and then more recently towards the end of last year earlier this year was in for a skin tear on her left lower leg caused by her dog. She comes in today with a reasonably acute area on her left forearm again apparently caused by her dog although it was not a bite injury. She was seen in urgent care and they managed to reoppose a lot of the skin tear although the patient states that is now reopened slightly. They have been using Neosporin. She has very fragile skin probably a combination of chronic steroid use for rheumatoid arthritis [although I do not see that she is on that now] solar skin damage. I note that she takes methotrexate and Plaquenil for her rheumatoid arthritis 9/15; this patient had a skin tear on her left forearm from trauma with her dog. She was seen in urgent care and they reapposed most of the separated skin. Fortunately for her most of this is remained intact and the skin above and below the wound actually looks healthy. Where there was a separation and the 2 flaps of skin there is still an open area however the granulation tissue looks healthy here. We have been using Xeroform however I had like to change to [facility] 9/29; skin tear on her left arm from a dog scratch. The skin was reapposed in an urgent care and surprisingly stayed viable. We used Hydrofera Blue last time the wound is smaller 10/13; patient comes back to clinic after 2 weeks for a left arm wound secondary to  a dog scratch. The skin was reapposed and in urgent care. We have been using Hydrofera Blue and the wound is closed today. Readmission 02/17/2022 Ms. [name] is an 88 year old female with a past medical history of chronic venous insufficiency and lymphedema, COPD, rheumatoid arthritis and hypertension that presents with a 2-week history of wounds to her lower extremities bilaterally. She states she had a skin tag to the right posterior leg that came off and became a wound. She states the area has slowly gotten larger. She was seen by her primary care physician's office on 02/15/2022 for this issue. She was prescribed Bactroban and cefdinir. She has 2 other wounds 1 located to each leg. These were from trauma. She states one was from trying to put on compression stockings. Her ABIs in office on  the left was 0.93 and 1.0 on the right. She currently denies systemic signs of infection. 02/24/2022 upon evaluation today patient appears to be doing well currently in regard to her skin tears. In general I feel like she is making good progress. Fortunately I do not see any evidence of active infection locally or systemically which is great news. No fevers, chills, nausea, vomiting, or diarrhea. 03/04/2022 upon evaluation patient actually appears to be doing well in regard to the wounds on her legs. She has been tolerating the dressing changes without complication. Overall I think all are showing signs of improvement which is great news. 03/10/2022 upon evaluation today patient appears to be doing better in regard to her wounds in general. I am very pleased with where things stand. I do believe that she is showing signs of good improvement which is great news. No fevers, chills, nausea, vomiting, or diarrhea. 03-21-2022 upon evaluation today patient appears to be doing well with regard to her wound on the left leg and the anterior right leg both are showing signs of significant improvement. Unfortunately she has  a lot of slough and biofilm buildup on the surface of the posterior wound which is not good. I do not see any signs of active infection locally or systemically which is great news. No fevers, chills, nausea, vomiting, or diarrhea. 03-29-2022 upon evaluation today patient appears to be doing well currently in regard to her wounds. She has been tolerating the dressing changes without complication. Fortunately there does not appear to be any signs of active infection at this time which is great news. No fevers, chills, nausea, vomiting, or diarrhea. 4/27; right posterior calf and left lateral calf both initially traumatic in the setting of significant venous insufficiency. She did not tolerate compression wraps apparently because she could not get shoes on her feet and therefore we have been using Tubigrip's and silver alginate 5/8; right posterior calf 100% covered with adherent debris. The area on the left lateral really looks quite good under illumination islands of new epithelialization. We have been using Xeroform on the left Iodoflex on the right bilateral 2 layer Tubigrip's 04-28-2022 upon evaluation today patient appears to be doing well with regard to her wound she has been tolerating the dressing changes which is great news. Overall I feel like the Xeroform is doing well on the left leg this is pretty much healed on the right leg she is very close to being healed as well I think we can probably switch over to Xeroform here as well. 05-05-2022 upon evaluation today patient appears to be doing excellent in regard to her wounds both are showing signs of being very close to complete resolution which is awesome news. Fortunately I do not see any signs of active infection at this time which is great news and in general I think we are headed in the right direction. Objective Constitutional Well-nourished and well-hydrated in no acute distress. Vitals Time Taken: 12:43 PM, Height: 60 in, Weight: 132  lbs, BMI: 25.8, Temperature: 98.3 F, Pulse: 66 bpm, Respiratory Rate: 18 breaths/min, Blood Pressure: 156/66 mmHg. Respiratory normal breathing without difficulty. Psychiatric [name], [name] ([id number]) this patient is able to make decisions and demonstrates good insight into disease process. Alert and Oriented x 3. pleasant and cooperative. General Notes: Patient's wounds again did not require any debridement and in fact were pretty much closed I do not know if either are really open but if they are closed this brand-new skin I do want  to protect it for at least 1 more week possibly to just depending on how things go I do not want her to heal only to come back shortly following with new issues all over again. Integumentary (Hair, Skin) Wound #13 status is Open. Original cause of wound was Trauma. The date acquired was: 02/09/2022. The wound has been in treatment 11 weeks. The wound is located on the Left Lower Leg. The wound measures 3cm length x 3.5cm width x 0.1cm depth; 8.247cm^2 area and 0.825cm^3 volume. There is Fat Layer (Subcutaneous Tissue) exposed. There is no tunneling or undermining noted. There is a small amount of serosanguineous drainage noted. There is large (67-100%) granulation within the wound bed. There is no necrotic tissue within the wound bed. Wound #15 status is Open. Original cause of wound was Trauma. The date acquired was: 02/09/2022. The wound has been in treatment 11 weeks. The wound is located on the Right Lower Leg. The wound measures 0.7cm length x 0.5cm width x 0.1cm depth; 0.275cm^2 area and 0.027cm^3 volume. There is Fat Layer (Subcutaneous Tissue) exposed. There is no tunneling or undermining noted. There is a medium amount of serosanguineous drainage noted. There is small (1-33%) red granulation within the wound bed. There is a large (67-100%) amount of necrotic tissue within the wound bed including Adherent Slough. Assessment Active  Problems ICD-10 Laceration without foreign body, left lower leg, initial encounter Laceration without foreign body, right lower leg, initial encounter Non-pressure chronic ulcer of other part of right lower leg with fat layer exposed Non-pressure chronic ulcer of other part of left lower leg with fat layer exposed Venous insufficiency (chronic) (peripheral) Plan Follow-up Appointments: Return Appointment in 1 week. Bathing/ Shower/ Hygiene: May shower; gently cleanse wound with antibacterial soap, rinse and pat dry prior to dressing wounds No tub bath. Anesthetic (Use 'Patient Medications' Section for Anesthetic Order Entry): Lidocaine applied to wound bed Edema Control - Lymphedema / Segmental Compressive Device / Other: Tubigrip double layer applied - size c Elevate, Exercise Daily and Avoid Standing for Long Periods of Time. Elevate legs to the level of the heart and pump ankles as often as possible Elevate leg(s) parallel to the floor when sitting. Off-Loading: Turn and reposition every 2 hours - recommend prop legs/heels so wounds do not touch the bed/chair Additional Orders / Instructions: Follow Nutritious Diet and Increase Protein Intake WOUND #13: - Lower Leg Wound Laterality: Left Cleanser: Byram Ancillary Kit - 15 Day Supply (Generic) Every Other Day/30 Days Discharge Instructions: Use supplies as instructed; Kit contains: (15) Saline Bullets; (15) 3x3 Gauze; 15 pr Gloves Cleanser: Soap and Water Every Other Day/30 Days Discharge Instructions: Gently cleanse wound with antibacterial soap, rinse and pat dry prior to dressing wounds Primary Dressing: Xeroform-HBD 2x2 (in/in) (Generic) Every Other Day/30 Days Discharge Instructions: Apply Xeroform-HBD 2x2 (in/in) as directed Secondary Dressing: ABD Pad 5x9 (in/in) (Generic) Every Other Day/30 Days Discharge Instructions: Cover with ABD pad Secured With: Medipore Tape - 79M Medipore H Soft Cloth Surgical Tape, 2x2 (in/yd)  (Generic) Every Other Day/30 Days Secured With: Kerlix Roll Sterile or Non-Sterile 6-ply 4.5x4 (yd/yd) Every Other Day/30 Days Discharge Instructions: Apply Kerlix as directed WOUND #15: - Lower Leg Wound Laterality: Right Cleanser: Byram Ancillary Kit - 15 Day Supply (Generic) Every Other Day/30 Days Discharge Instructions: Use supplies as instructed; Kit contains: (15) Saline Bullets; (15) 3x3 Gauze; 15 pr Gloves Cleanser: Soap and Water Every Other Day/30 Days Discharge Instructions: Gently cleanse wound with antibacterial soap, rinse and pat  dry prior to dressing wounds Primary Dressing: Xeroform-HBD 2x2 (in/in) Every Other Day/30 Days Discharge Instructions: Apply Xeroform-HBD 2x2 (in/in) as directed Secondary Dressing: ABD Pad 5x9 (in/in) (Generic) Every Other Day/30 Days Discharge Instructions: Cover with ABD pad [name], [name] ([id number]) Secured With: Faxon Surgical Tape, 2x2 (in/yd) (Generic) Every Other Day/30 Days Secured With: Kerlix Roll Sterile or Non-Sterile 6-ply 4.5x4 (yd/yd) Every Other Day/30 Days Discharge Instructions: Apply Kerlix as directed 1. I am going to recommend currently that we go and continue with the wound care measures as before and the patient is in agreement with plan this includes the use of the Xeroform gauze to both wound locations. 2. Also can recommend that we have the patient continue to monitor for any signs of worsening infection. Office if anything changes she should let me know we will cover with an ABD pad and roll gauze to secure in place followed by Tubigrip. We will see patient back for reevaluation in 1 week here in the clinic. If anything worsens or changes patient will contact our office for additional recommendations. Electronic Signature(s) Signed: 05/05/2022 5:26:18 PM By: Worthy Keeler PA-C Entered By: Worthy Keeler on 05/05/2022 17:26:17 [name]  ([id number]) -------------------------------------------------------------------------------- SuperBill Details Patient Name: [name] Date of Service: 05/05/2022 Medical Record Number: [id number] Patient Account Number: [id number] Date of Birth/Sex: [birth year] (88 y.o. F) Treating RN: Carlene Coria Primary Care Provider: Fulton Reek Other Clinician: Referring Provider: Fulton Reek Treating Provider/Extender: Skipper Cliche in Treatment: 11 Diagnosis Coding ICD-10 Codes Code Description [phone] Laceration without foreign body, left lower leg, initial encounter S81.811A Laceration without foreign body, right lower leg, initial encounter L97.812 Non-pressure chronic ulcer of other part of right lower leg with fat layer exposed L97.822 Non-pressure chronic ulcer of other part of left lower leg with fat layer exposed I87.2 Venous insufficiency (chronic) (peripheral) Facility Procedures CPT4 Code: [id number] Description: 99213 - WOUND CARE VISIT-LEV 3 EST PT Modifier: Quantity: 1 Physician Procedures CPT4 Code: 2060156 Description: 15379 - WC PHYS LEVEL 3 - EST PT Modifier: Quantity: 1 CPT4 Code: Description: ICD-10 Diagnosis Description S81.812A Laceration without foreign body, left lower leg, initial encounter S81.811A Laceration without foreign body, right lower leg, initial encounter L97.822 Non-pressure chronic ulcer of other part of left  lower leg with fat lay L97.812 Non-pressure chronic ulcer of other part of right lower leg with fat la Modifier: er exposed yer exposed Quantity: Electronic Signature(s) Signed: 05/05/2022 5:26:36 PM By: Worthy Keeler PA-C Previous Signature: 05/05/2022 2:19:12 PM Version By: Carlene Coria RN Entered By: Worthy Keeler on 05/05/2022 17:26:36

## 2022-05-13 ENCOUNTER — Encounter: Payer: Medicare Other | Attending: Physician Assistant | Admitting: Physician Assistant

## 2022-05-13 DIAGNOSIS — X58XXXA Exposure to other specified factors, initial encounter: Secondary | ICD-10-CM | POA: Insufficient documentation

## 2022-05-13 DIAGNOSIS — J449 Chronic obstructive pulmonary disease, unspecified: Secondary | ICD-10-CM | POA: Insufficient documentation

## 2022-05-13 DIAGNOSIS — M069 Rheumatoid arthritis, unspecified: Secondary | ICD-10-CM | POA: Insufficient documentation

## 2022-05-13 DIAGNOSIS — S81812A Laceration without foreign body, left lower leg, initial encounter: Secondary | ICD-10-CM | POA: Insufficient documentation

## 2022-05-13 DIAGNOSIS — I10 Essential (primary) hypertension: Secondary | ICD-10-CM | POA: Insufficient documentation

## 2022-05-13 DIAGNOSIS — M419 Scoliosis, unspecified: Secondary | ICD-10-CM | POA: Insufficient documentation

## 2022-05-13 DIAGNOSIS — L97812 Non-pressure chronic ulcer of other part of right lower leg with fat layer exposed: Secondary | ICD-10-CM | POA: Insufficient documentation

## 2022-05-13 DIAGNOSIS — L97822 Non-pressure chronic ulcer of other part of left lower leg with fat layer exposed: Secondary | ICD-10-CM | POA: Insufficient documentation

## 2022-05-13 DIAGNOSIS — Z950 Presence of cardiac pacemaker: Secondary | ICD-10-CM | POA: Insufficient documentation

## 2022-05-13 DIAGNOSIS — I872 Venous insufficiency (chronic) (peripheral): Secondary | ICD-10-CM | POA: Insufficient documentation

## 2022-05-13 DIAGNOSIS — S81811A Laceration without foreign body, right lower leg, initial encounter: Secondary | ICD-10-CM | POA: Insufficient documentation

## 2022-05-13 NOTE — Progress Notes (Addendum)
Natasha, Barnett ([id number]) Visit Report for 05/13/2022 Arrival Information Details Patient Name: [name], [name]. Date of Service: 05/13/2022 2:15 PM Medical Record Number: [id number] Patient Account Number: [id number] Date of Birth/Sex: 23-Mar-1930 (88 y.o. F) Treating RN: Cornell Barman Primary Care Alisha Bacus: Fulton Reek Other Clinician: Massie Kluver Referring Dondra Rhett: Fulton Reek Treating Michayla Mcneil/Extender: Skipper Cliche in Treatment: 12 Visit Information History Since Last Visit Added or deleted any medications: No Patient Arrived: Wheel Chair Any new allergies or adverse reactions: No Arrival Time: 14:47 Had a fall or experienced change in No Transfer Assistance: EasyPivot Patient activities of daily living that may affect Lift risk of falls: Patient Requires Transmission-Based No Signs or symptoms of abuse/neglect since last visito No Precautions: Hospitalized since last visit: No Patient Has Alerts: Yes Pain Present Now: No Patient Alerts: NOT diabetic Electronic Signature(s) Signed: 05/17/2022 4:20:44 PM By: Massie Kluver Entered By: Massie Kluver on 05/13/2022 14:48:13 [name] ([id number]) -------------------------------------------------------------------------------- Clinic Level of Care Assessment Details Patient Name: [name] Date of Service: 05/13/2022 2:15 PM Medical Record Number: [id number] Patient Account Number: [id number] Date of Birth/Sex: January 02, 1930 (88 y.o. F) Treating RN: Cornell Barman Primary Care Christyana Corwin: Fulton Reek Other Clinician: Massie Kluver Referring Keyondra Lagrand: Fulton Reek Treating Johnna Bollier/Extender: Skipper Cliche in Treatment: 12 Clinic Level of Care Assessment Items TOOL 4 Quantity Score _0  - Use when only an EandM is performed on FOLLOW-UP visit 0 ASSESSMENTS - Nursing Assessment / Reassessment X - Reassessment of Co-morbidities (includes updates in patient status) 1 10 X- 1 5 Reassessment of  Adherence to Treatment Plan ASSESSMENTS - Wound and Skin Assessment / Reassessment _1  - Simple Wound Assessment / Reassessment - one wound 0 X- 2 5 Complex Wound Assessment / Reassessment - multiple wounds _2  - 0 Dermatologic / Skin Assessment (not related to wound area) ASSESSMENTS - Focused Assessment _3  - Circumferential Edema Measurements - multi extremities 0 _4  - 0 Nutritional Assessment / Counseling / Intervention _5  - 0 Lower Extremity Assessment (monofilament, tuning fork, pulses) _6  - 0 Peripheral Arterial Disease Assessment (using hand held doppler) ASSESSMENTS - Ostomy and/or Continence Assessment and Care _7  - Incontinence Assessment and Management 0 _8  - 0 Ostomy Care Assessment and Management (repouching, etc.) PROCESS - Coordination of Care X - Simple Patient / Family Education for ongoing care 1 15 _9  - 0 Complex (extensive) Patient / Family Education for ongoing care _10  - 0 Staff obtains Programmer, systems, Records, Test Results / Process Orders _11  - 0 Staff telephones HHA, Nursing Homes / Clarify orders / etc _12  - 0 Routine Transfer to another Facility (non-emergent condition) _13  - 0 Routine Hospital Admission (non-emergent condition) _14  - 0 New Admissions / Biomedical engineer / Ordering NPWT, Apligraf, etc. _15  - 0 Emergency Hospital Admission (emergent condition) X- 1 10 Simple Discharge Coordination _16  - 0 Complex (extensive) Discharge Coordination PROCESS - Special Needs _17  - Pediatric / Minor Patient Management 0 _18  - 0 Isolation Patient Management _19  - 0 Hearing / Language / Visual special needs _20  - 0 Assessment of Community assistance (transportation, D/C planning, etc.) _21  - 0 Additional assistance / Altered mentation _22  - 0 Support Surface(s) Assessment (bed, cushion, seat, etc.) INTERVENTIONS - Wound Cleansing / Measurement SHANETTE, TAMARGO V. ([id number]) _23  - 0 Simple Wound Cleansing - one wound X- 2 5 Complex Wound Cleansing - multiple  wounds X- 1 5 Wound Imaging (photographs - any number of wounds) _24  - 0 Wound Tracing (instead of photographs) _25  - 0 Simple Wound Measurement - one  wound X- 2 5 Complex Wound Measurement - multiple wounds INTERVENTIONS - Wound Dressings _0  - Small Wound Dressing one or multiple wounds 0 X- 2 15 Medium Wound Dressing one or multiple wounds _1  - 0 Large Wound Dressing one or multiple wounds <XQJJHERDEYCXKGYJ>_8<\/HUDJSHFWYOVZCHYI>_5  - 0 Application of Medications - topical <OYDXAJOINOMVEHMC>_9<\/OBSJGGEZMOQHUTML>_4  - 0 Application of Medications - injection INTERVENTIONS - Miscellaneous _4  - External ear exam 0 _5  - 0 Specimen Collection (cultures, biopsies, blood, body fluids, etc.) _6  - 0 Specimen(s) / Culture(s) sent or taken to Lab for analysis _7  - 0 Patient Transfer (multiple staff / Harrel Lemon Lift / Similar devices) _8  - 0 Simple Staple / Suture removal (25 or less) _9  - 0 Complex Staple / Suture removal (26 or more) _10  - 0 Hypo / Hyperglycemic Management (close monitor of Blood Glucose) _11  - 0 Ankle / Brachial Index (ABI) - do not check if billed separately X- 1 5 Vital Signs Has the patient been seen at the hospital within the last three years: Yes Total Score: 110 Level Of Care: New/Established - Level 3 Electronic Signature(s) Signed: 05/17/2022 4:20:44 PM By: Massie Kluver Entered By: Massie Kluver on 05/13/2022 16:43:08 [name] ([id number]) -------------------------------------------------------------------------------- Encounter Discharge Information Details Patient Name: [name] Date of Service: 05/13/2022 2:15 PM Medical Record Number: [id number] Patient Account Number: [id number] Date of Birth/Sex: 1930/06/15 (88 y.o. F) Treating RN: Cornell Barman Primary Care Starlette Thurow: Fulton Reek Other Clinician: Massie Kluver Referring Zelena Bushong: Fulton Reek Treating Rai Sinagra/Extender: Skipper Cliche in Treatment: 12 Encounter Discharge Information Items Discharge Condition: Stable Ambulatory Status:  Wheelchair Discharge Destination: Home Transportation: Private Auto Schedule Follow-up Appointment: Yes Clinical Summary of Care: Electronic Signature(s) Signed: 05/17/2022 4:20:44 PM By: Massie Kluver Entered By: Massie Kluver on 05/13/2022 16:44:33 [name] ([id number]) -------------------------------------------------------------------------------- Lower Extremity Assessment Details Patient Name: [name] Date of Service: 05/13/2022 2:15 PM Medical Record Number: [id number] Patient Account Number: [id number] Date of Birth/Sex: 06/30/1930 (88 y.o. F) Treating RN: Cornell Barman Primary Care Tayquan Gassman: Fulton Reek Other Clinician: Massie Kluver Referring Santiago Stenzel: Fulton Reek Treating Kaston Faughn/Extender: Skipper Cliche in Treatment: 12 Edema Assessment Assessed: Shirlyn Goltz: Yes] Patrice Paradise: Yes] Edema: [Left: Yes] [Right: Yes] Calf Left: Right: Point of Measurement: 32 cm From Medial Instep 32.8 cm 32.5 cm Ankle Left: Right: Point of Measurement: 10 cm From Medial Instep 21.5 cm 21 cm Vascular Assessment Pulses: Dorsalis Pedis Palpable: [Left:Yes] [Right:Yes] Electronic Signature(s) Signed: 05/13/2022 3:55:46 PM By: Gretta Cool, BSN, RN, CWS, Kim RN, BSN Signed: 05/17/2022 4:20:44 PM By: Massie Kluver Entered By: Massie Kluver on 05/13/2022 15:07:53 [name] ([id number]) -------------------------------------------------------------------------------- Multi Wound Chart Details Patient Name: [name] Date of Service: 05/13/2022 2:15 PM Medical Record Number: [id number] Patient Account Number: [id number] Date of Birth/Sex: 11/20/1930 (88 y.o. F) Treating RN: Cornell Barman Primary Care Josalyn Dettmann: Fulton Reek Other Clinician: Massie Kluver Referring Shatima Zalar: Fulton Reek Treating Kendria Halberg/Extender: Skipper Cliche in Treatment: 12 Vital Signs Height(in): 60 Pulse(bpm): 40 Weight(lbs): 132 Blood Pressure(mmHg): 131/57 Body Mass Index(BMI):  25.8 Temperature(F): 98.1 Respiratory Rate(breaths/min): 18 Photos: [N/A:N/A] Wound Location: Left Lower Leg Right Lower Leg N/A Wounding Event: Trauma Trauma N/A Primary Etiology: Skin Tear Venous Leg Ulcer N/A Comorbid History: Cataracts, Glaucoma, Optic Cataracts, Glaucoma, Optic N/A Neuritis, Chronic sinus Neuritis, Chronic sinus problems/congestion, Middle ear problems/congestion, Middle ear problems, Hypertension, problems, Hypertension, Rheumatoid Arthritis Rheumatoid Arthritis Date Acquired: 02/09/2022 02/09/2022 N/A Weeks of Treatment: 12 12 N/A Wound Status: Open Open N/A Wound Recurrence: No No N/A Measurements L x W x D (  cm) 3x1.9x0.1 1.3x0.9x0.1 N/A Area (cm) : 4.477 0.919 N/A Volume (cm) : 0.448 0.092 N/A % Reduction in Area: -192.20% 92.20% N/A % Reduction in Volume: -192.80% 92.20% N/A Classification: Full Thickness Without Exposed Full Thickness Without Exposed N/A Support Structures Support Structures Exudate Amount: Small Medium N/A Exudate Type: Serosanguineous Serosanguineous N/A Exudate Color: red, brown red, brown N/A Granulation Amount: Large (67-100%) Small (1-33%) N/A Granulation Quality: N/A Red N/A Necrotic Amount: None Present (0%) Large (67-100%) N/A Exposed Structures: Fat Layer (Subcutaneous Tissue): Fat Layer (Subcutaneous Tissue): N/A Yes Yes Fascia: No Fascia: No Tendon: No Tendon: No Muscle: No Muscle: No Joint: No Joint: No Bone: No Bone: No Epithelialization: None None N/A Treatment Notes Electronic Signature(s) Signed: 05/17/2022 4:20:44 PM By: Massie Kluver Entered By: Massie Kluver on 05/13/2022 15:08:15 [name] ([id number]) [name] ([id number]) -------------------------------------------------------------------------------- [place] Details Patient Name: [name] Date of Service: 05/13/2022 2:15 PM Medical Record Number: [id number] Patient Account Number: [id number] Date of  Birth/Sex: 1930/07/22 (88 y.o. F) Treating RN: Cornell Barman Primary Care Solan Vosler: Fulton Reek Other Clinician: Massie Kluver Referring Mckinzi Eriksen: Fulton Reek Treating Nihal Doan/Extender: Skipper Cliche in Treatment: 12 Active Inactive Wound/Skin Impairment Nursing Diagnoses: Knowledge deficit related to ulceration/compromised skin integrity Goals: Patient/caregiver will verbalize understanding of skin care regimen Date Initiated: 02/17/2022 Date Inactivated: 03/21/2022 Target Resolution Date: 03/20/2022 Goal Status: Met Ulcer/skin breakdown will have a volume reduction of 30% by week 4 Date Initiated: 02/17/2022 Date Inactivated: 03/21/2022 Target Resolution Date: 03/20/2022 Goal Status: Met Ulcer/skin breakdown will have a volume reduction of 50% by week 8 Date Initiated: 02/17/2022 Target Resolution Date: 04/19/2022 Goal Status: Active Ulcer/skin breakdown will have a volume reduction of 80% by week 12 Date Initiated: 02/17/2022 Target Resolution Date: 05/20/2022 Goal Status: Active Ulcer/skin breakdown will heal within 14 weeks Date Initiated: 02/17/2022 Target Resolution Date: 06/19/2022 Goal Status: Active Interventions: Assess patient/caregiver ability to obtain necessary supplies Assess patient/caregiver ability to perform ulcer/skin care regimen upon admission and as needed Assess ulceration(s) every visit Notes: Electronic Signature(s) Signed: 05/13/2022 3:55:46 PM By: Gretta Cool, BSN, RN, CWS, Kim RN, BSN Signed: 05/17/2022 4:20:44 PM By: Massie Kluver Entered By: Massie Kluver on 05/13/2022 15:08:03 [name] ([id number]) -------------------------------------------------------------------------------- Pain Assessment Details Patient Name: [name] Date of Service: 05/13/2022 2:15 PM Medical Record Number: [id number] Patient Account Number: [id number] Date of Birth/Sex: 1930/10/25 (88 y.o. F) Treating RN: Cornell Barman Primary Care Jesselle Laflamme: Fulton Reek Other  Clinician: Massie Kluver Referring Palmer Shorey: Fulton Reek Treating Michi Herrmann/Extender: Skipper Cliche in Treatment: 12 Active Problems Location of Pain Severity and Description of Pain Patient Has Paino No Site Locations Pain Management and Medication Current Pain Management: Electronic Signature(s) Signed: 05/13/2022 3:55:46 PM By: Gretta Cool, BSN, RN, CWS, Kim RN, BSN Signed: 05/17/2022 4:20:44 PM By: Massie Kluver Entered By: Massie Kluver on 05/13/2022 14:51:15 [name] ([id number]) -------------------------------------------------------------------------------- Patient/Caregiver Education Details Patient Name: [name] Date of Service: 05/13/2022 2:15 PM Medical Record Number: [id number] Patient Account Number: [id number] Date of Birth/Gender: 1930-03-04 (88 y.o. F) Treating RN: Cornell Barman Primary Care Physician: Fulton Reek Other Clinician: Massie Kluver Referring Physician: Fulton Reek Treating Physician/Extender: Skipper Cliche in Treatment: 12 Education Assessment Education Provided To: Patient Education Topics Provided Wound/Skin Impairment: Handouts: Other: continue wound care as directed Methods: Explain/Verbal Responses: State content correctly Electronic Signature(s) Signed: 05/17/2022 4:20:44 PM By: Massie Kluver Entered By: Massie Kluver on 05/13/2022 16:43:44 [name] ([id number]) -------------------------------------------------------------------------------- Wound Assessment Details Patient Name: [name] Date  of Service: 05/13/2022 2:15 PM Medical Record Number: [id number] Patient Account Number: [id number] Date of Birth/Sex: 1930-06-01 (88 y.o. F) Treating RN: Cornell Barman Primary Care Jaidon Sponsel: Fulton Reek Other Clinician: Massie Kluver Referring Caid Radin: Fulton Reek Treating Damein Gaunce/Extender: Skipper Cliche in Treatment: 12 Wound Status Wound Number: 13 Primary Skin Tear Etiology: Wound  Location: Left Lower Leg Wound Open Wounding Event: Trauma Status: Date Acquired: 02/09/2022 Comorbid Cataracts, Glaucoma, Optic Neuritis, Chronic sinus Weeks Of Treatment: 12 History: problems/congestion, Middle ear problems, Hypertension, Clustered Wound: No Rheumatoid Arthritis Photos Wound Measurements Length: (cm) 3 Width: (cm) 1.9 Depth: (cm) 0.1 Area: (cm) 4.477 Volume: (cm) 0.448 % Reduction in Area: -192.2% % Reduction in Volume: -192.8% Epithelialization: None Wound Description Classification: Full Thickness Without Exposed Support Structu Exudate Amount: Medium Exudate Type: Serosanguineous Exudate Color: red, brown res Foul Odor After Cleansing: No Slough/Fibrino No Wound Bed Granulation Amount: Large (67-100%) Exposed Structure Necrotic Amount: None Present (0%) Fascia Exposed: No Fat Layer (Subcutaneous Tissue) Exposed: Yes Tendon Exposed: No Muscle Exposed: No Joint Exposed: No Bone Exposed: No Electronic Signature(s) Signed: 05/19/2022 1:47:34 PM By: Carlene Coria RN Signed: 05/24/2022 12:11:19 PM By: Gretta Cool, BSN, RN, CWS, Kim RN, BSN Previous Signature: 05/13/2022 3:55:46 PM Version By: Gretta Cool, BSN, RN, CWS, Kim RN, BSN Previous Signature: 05/17/2022 4:20:44 PM Version By: Massie Kluver Entered By: Carlene Coria on 05/19/2022 13:47:34 [name] ([id number]) -------------------------------------------------------------------------------- Wound Assessment Details Patient Name: [name] Date of Service: 05/13/2022 2:15 PM Medical Record Number: [id number] Patient Account Number: [id number] Date of Birth/Sex: October 26, 1930 (88 y.o. F) Treating RN: Cornell Barman Primary Care Zahira Brummond: Fulton Reek Other Clinician: Massie Kluver Referring Allee Busk: Fulton Reek Treating Ivyrose Hashman/Extender: Skipper Cliche in Treatment: 12 Wound Status Wound Number: 15 Primary Venous Leg Ulcer Etiology: Wound Location: Right Lower Leg Wound Open Wounding Event:  Trauma Status: Date Acquired: 02/09/2022 Comorbid Cataracts, Glaucoma, Optic Neuritis, Chronic sinus Weeks Of Treatment: 12 History: problems/congestion, Middle ear problems, Hypertension, Clustered Wound: No Rheumatoid Arthritis Photos Wound Measurements Length: (cm) 1.3 Width: (cm) 0.9 Depth: (cm) 0.1 Area: (cm) 0.919 Volume: (cm) 0.092 % Reduction in Area: 92.2% % Reduction in Volume: 92.2% Epithelialization: None Wound Description Classification: Full Thickness Without Exposed Support Structu Exudate Amount: Medium Exudate Type: Serosanguineous Exudate Color: red, brown res Foul Odor After Cleansing: No Slough/Fibrino Yes Wound Bed Granulation Amount: Small (1-33%) Exposed Structure Granulation Quality: Red Fascia Exposed: No Necrotic Amount: Large (67-100%) Fat Layer (Subcutaneous Tissue) Exposed: Yes Necrotic Quality: Adherent Slough Tendon Exposed: No Muscle Exposed: No Joint Exposed: No Bone Exposed: No Electronic Signature(s) Signed: 05/13/2022 3:55:46 PM By: Gretta Cool, BSN, RN, CWS, Kim RN, BSN Signed: 05/17/2022 4:20:44 PM By: Massie Kluver Entered By: Massie Kluver on 05/13/2022 15:04:54 [name] ([id number]) -------------------------------------------------------------------------------- [place] Details Patient Name: [name] Date of Service: 05/13/2022 2:15 PM Medical Record Number: [id number] Patient Account Number: [id number] Date of Birth/Sex: 18-Mar-1930 (88 y.o. F) Treating RN: Cornell Barman Primary Care Harold Moncus: Fulton Reek Other Clinician: Massie Kluver Referring Elizibeth Breau: Fulton Reek Treating Carrington Mullenax/Extender: Skipper Cliche in Treatment: 12 Vital Signs Time Taken: 14:49 Temperature (F): 98.1 Height (in): 60 Pulse (bpm): 61 Weight (lbs): 132 Respiratory Rate (breaths/min): 18 Body Mass Index (BMI): 25.8 Blood Pressure (mmHg): 131/57 Reference Range: 80 - 120 mg / dl Electronic Signature(s) Signed: 05/17/2022 4:20:44  PM By: Massie Kluver Entered By: Massie Kluver on 05/13/2022 14:50:46

## 2022-05-13 NOTE — Progress Notes (Addendum)
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 initially traumatic wound on her right anterior leg. Reasonably small wound but still adherent thick necrotic debris. We have been using Santyl 12/06/17 traumatic wound on the right anterior leg. Small wound but again adherent necrotic debris on the surface 95%. We have been using Santyl 12/13/86; small  lright anterior traumatic leg wound. Using Santyl that again with adherent debris perhaps down to 50%. I changed her to Iodoflex today 12/20/17; right anterior leg traumatic wound. She again presents with debris about 50% of the wound. I changed her to Iodoflex last week but so far not a lot in the way of response 12/27/17; right anterior leg traumatic wound. She again presents with debris on the wound although it looks better. She is using Iodoflex entering her third week now. Still requiring debridement 01/16/18 on evaluation today patient seems to be doing fairly well in regard to her right lower extremity ulcer. She has been tolerating the dressing changes without complication. With that being said she does note that she's been having a lot of burning with the current dressing which is specifically the Iodoflex. Obviously this is a known side effect of the iodine in the dressing and I believe that may be giving her trouble. No fevers, chills, nausea, or vomiting noted at this time. Otherwise the wound does appear to be doing well. 01/30/18 on evaluation today patient appears to be doing well in regard to her right anterior lower extremity ulcer. She notes that this does seem to be smaller and she wonders why we did not start the Prisma dressing sooner since it has made such a big difference in such a short amount of time. I explained that obviously we have to wait for the wound to get to a certain point along his healing path before we can initiate the Prisma otherwise it will not be effective. Therefore once the wound became clean it was then time to initiate the Prisma. Nonetheless good news is she is noting excellent improvement she does still have some discomfort but nothing as significant as previously noted. 04/17/18 on evaluation today patient appears to be doing very well and in fact her right lower extremity ulcer has completely healed at this point I'm pleased with this. The left lower  extremity ulcer seem to be doing better although she still does have some openings noted the Prisma I think is helping more than the Xeroform was in my pinion. With that being said she still has a lot of healing to do in this  regard. 04/27/18 on evaluation today patient appears to be doing very well in regard to her left lower Trinity ulcers. She has been tolerating the dressing changes without complication. I do have a note from her orthopedic surgeon today and they would like for me to help with treating her left elbow surgery site where she had the bursa removed and this was performed roughly 4 weeks ago according to the note that I reviewed. She has been placed on Bactrim DS by need for her leg wounds this probably helped a little bit with the left elbow surgery site. Obviously I do think this is something we can try to help her out with. 05/04/18 on evaluation today patient appears to be doing well in regard to her left anterior lower Trinity ulcers. She is making good progress which is great news. Unfortunately her elbow which we are also managing at this point in time has not made as much progress unfortunately. She has been tolerating the dressing changes without complication. She did see Dr. Benson Setting earlier today and he states that he's willing to give this three weeks to see if she's making any progress with wound care. However he states that she's really not then he will need to go back in and perform EIZA, CANNIFF V. ([id number]) further surgery. Obviously she is trying to avoid surgery if at all possible although I'm not sure if this is going to be possible or least not that quickly. 05/11/18 on evaluation today patient appears to be doing very well in regard to her left lower extremity ulcers. Unfortunately in regard to her elbow this is very slow coming about as far as any improvement is concerned. I do feel like there may be a little bit more granulation noted in the base of the wound but  nothing too significant unfortunately. I still can probe bone in the proximal portion of the wound which obviously explain to the patient is not good. She will be having a follow-up with her orthopedic surgeon in the next couple of weeks. In the meantime we are trying to do as much as we can to try to show signs of improvement in healing to avoid the need for any additional and further surgery. Nonetheless I explained to the patient yet again today I'm not sure if that is going to be feasible or not obviously it's more risk for her to continue to have an open wound with bone exposure then to the back in for additional surgery even though I know she doesn't want to go that route. 05/15/18 on evaluation today patient presents for follow-up concerning her ongoing lower extremity ulcers on the left as well as the left elbow ulcer. She has at this point in time been tolerating the dressing changes without complication. Her left lower extremity ulcer appears to be doing very well. In regard to the left elbow ulcer she actually does seem to have additional granulation today which is good news. I am definitely seeing signs of improvement although obviously this is somewhat slow improvement. Nonetheless I'm hopeful we will be able to avoid her having to have any further surgery but again that would definitely be a conversation between herself as well as her surgeon once he sees her for reevaluation. Otherwise she does want to see about having a three order compression stockings for her today 05/21/18 on evaluation today patient appears to be doing well in regard to her left lower surety ulcer. This is almost completely healed and seems to  be progressing very nicely. With that being said her left elbow is another story. I'm not really convinced in the past three weeks we've seen a significant improvement in this wound. With that being said if this is something that there is no surgical option for him we have to  continue to work on this from the standpoint of conservative management with wound care she may make improvement given time. Nonetheless it appears that her surgeon is somewhat concerned about the possibility of infection and really is leaning towards additional surgery to try and help close this wound. Nonetheless the patient is still unsure of exactly what to do. 05/29/18 on evaluation today patient appears to be doing well in regard to her left lower extremity ulcer. She's been tolerating the dressing changes without complication which is good news. With that being said she's been having issues specifically with her elbow she did see her surgeon Dr. Roland Rack and he is recommending a repeat surgery to the left elbow in order to correct the issue. The patient is still somewhat unsure of this but feels like this may be better than trying to take time to let this heal over a longer period of time through normal wound care measures. Again I explained that I agree this may be a faster way to go if her surgeon feels that this is indeed a good direction to take. Obviously only he can make the judgment on whether or not the surgery would likely be successful. 06/04/18 on evaluation today patient actually presents for follow-up concerning her left elbow and left lower from the ulcer she seems to be doing very well at this point in time. She has been tolerating the dressing changes without complication. With that being said her elbow is not significantly better she actually is scheduled for surgery tomorrow. 07/04/18; the patient had an area on her left leg that is remaining closed. The open area she has now is a postsurgical wound on the left elbow. I think we have clearance from the surgeon to see this now. We're using Prisma 07/11/18; we're currently dealing with a surgical wound on the left olecranon process. The patient complains of a lot of pain and drainage. When I saw her last week we did an x-ray that  showed soft tissue wound and probable elbow joint effusion but no erosion to suggest osteomyelitis. The culture I did of this was somewhat surprisingly negative. She has a small open wound with not a viable surface there is considerable undermining relative to the wound size. She is on methotrexate for rheumatoid arthritis/overlap syndrome also plaquenil. We've been using silver collagen 07/18/18-She is seen in follow-up evaluation for a left elbow wound. There is essentially no change. She is currently on Zithromax and will complete that on Friday, there is no indication to extend this. We will change to iodosorb/iodoflex and monitor for response 07/25/18-She is seen in follow-up evaluation for left elbow wound. The wound is stable with no overt evidence of infection. She has counseled with her rheumatologist. She is wanting to restart her methotrexate; a culture was obtained to rule out occult infection before starting her methotrexate. We will continue with Iodosorb/Iodoflex and she will follow-up next week. 08/01/18; this is a difficult wound over her left olecranon process. There is been concerned about infection although cultures including one done last week were negative. Pending 3 weeks ago I gave her an empiric course of antibiotics. She is having a lot of rheumatologic pain in her hands with  pain and stiffness. She wants to go on her weekly methotrexate and I think it would be reasonable to do so. We have been using Iodoflex 08/01/18; difficult wound over her left olecranon process. She started back on methotrexate last week because of rheumatologic pain in her hands. We have been using Iodoflex to try and clean out the wound bed. She has been approved for Graphix PL 08/15/18; 2 week follow-up. Difficult wound over her left olecranon process. Graphix PL #1 with collagen backing 08/22/18; one-week follow-up. Difficult wound over her left olecranon process. Graphix PL #2 08/29/18; no major improvement.  Difficult wound over her left olecranon process. Still considerable undermining. Graphics PL #3 1 week follow-up. Graphix #4 09/12/18 graphics #5. Some improvement in wound area although the undermining superiorly still has not closed down as much as I would like 09/19/18; Graphix #6 I think there is improvement in the undermining from 7 to 9:00. Wound bed looks healthy. 09/26/18 Graffix #7 undermining is 0.5 cm maximally at roughly 8:00. From 12 to 7:00 the tissue is adherent which is a major improvement there is some advancing skin from this side. 10/03/18; Graphix #8 no major changes from last week 10/10/18 Graffix #9 There are improvements. There appears to be granulation coming up to the surface here and there is a lot less undermining at 8:00. 10/17/18. Graffix #10; Dimensions are improved less undermining surface felt the but the wound is still open. Initially a surgical wound following a bursectomy 10/24/18; Graffix #11. This is really stalled over the last 2 weeks. If there is no further improvement this will be the last application.The final option for this difficult area would be plastic surgery and will set up a consult with Dr. Lorayne Bender in [facility] 10/31/18; wound looks about the same. The undermining superiorly is 0.7 cm. On the lateral edges perhaps some improvement there is no drainage. 11-07-2018 patient seen today for follow-up and management of left elbow wound. She has completed a total of 11 treatments of the graffix with not much improvement. She has an upcoming appointment with plastic surgery to assist with additional treatment options for the left elbow wound on 11/19/18. There is significant amount surrounding undermining of the wound is 0.9 cm. Currently prescribed methotrexate. Wound is being treated with Indoform and border dressing. No drainage from wound. No fever, chills. or pain. 11/21/18; the patient continues to have the wound looking roughly the same with  undermining from about 12 to 6:00. This has not changed all that much. She does have skin irritation around the wound that looks like drainage maceration issues. The patient states that she was not able to have her wound dressing changed because of illness in the person he usually does this. She also did not attend her clinic appointment today with Dr. Lorayne Bender because of transportation issues. She is rebooked for some time in mid January 11/28/2018; the patient has less undermining using endoform. As a understandings she saw Dr. Doy Mince who is Dr. Rebekah Chesterfield partner. He recommended putting her in a elbow brace and I believe is written a prescription for it. He also recommended Motrin 800 mg 3 times daily. This is prescription strength ibuprofen although he did not write his prescription. This apparently was for 2 weeks. Culture I did last time grew a few DEMITRA, DANLEY V. ([id number]) methicillin sensitive staph aureus. After some difficulty due to drug intolerances/allergies and drug interactions I settled on a 5-day course of azithromycin 12/03/18 on evaluation today patient actually appears to be  doing fairly well in regard to her elbow when compared to last time I evaluated her. With that being said there does not appear to be any signs of infection at this time. That was the big concern currently as far as the patient was concerned. Nonetheless I do feel like she is making progress in regard to the feeling of this ulcer it has been slow. She did see a Psychiatric nurse they are talking about putting her in a brace in order to allow this area to heal more appropriately. 12/19/2018; not much change in this from the last time I have saw this.'s much smaller area than when she first came in and with less circumferential undermining however this is never really adhered. She is wearing the brace that was given or prescribed to her by plastics. She did not have a procedure offered to attempt to close  this. We have been using endoform 1/15; wound actually is not doing as well as last week. She was actually not supposed to come into this clinic again until next week but apparently her attendant noticed some redness increasing pain and she came in early. She reports the same amount of drainage. We have been using endoform. She is approved through puraply however I will only consider starting that next week 1/22; she completed the antibiotics last week. Culture I did was negative. In spite of this there is less erythema and pain complaints in the wound. Puraply #1 applied today 1/29; Puraply #2 today. Wound surface looks a lot better post debridement of adherent fibrinous material. However undermining from 6-12 is measuring worse 2/5; Puraply #3. Using her elbow brace 2/12 puraply #4 2/19 puraply #5. The 9:00 undermining measured at 0.5 cm. Undermining from 4-11 o'clock. Surface of the wound looks better and the circumference of the wound is smaller however the undermining is not really changed 2/26; still not much improvement. She has undermining from 4-9 o'clock 0.9 cm. Surface of the wound covered and adherent debris. 3/4; still no improvement. Undermining from 4-9 o'clock still around a centimeter. Surface of the wound looks somewhat better. No debridement is required we used endoform after we ended the trial of puraply last week 3/11; really no improvement at all. Still 1 cm undermining from roughly 9-3 o'clock. This is about a centimeter. The base of the wound looks fairly healthy. No debridement. We have been using endoform. I am really out of most usual options here. I could consider either another round of an amniotic advanced treatment product example epifix or perhaps regranex. Understandably the patient is a bit frustrated. We did send her to plastic surgery for a consult. Other than prescribing her a brace to immobilize the elbow they did not think she was a candidate for any  further surgery. Notable that the patient is not using the brace today 3/18-Patient returns for attention to the left elbow area which apparently looked red at the home health visit. Patient's elbow looks the same if not better compared with last visit. The area of ulceration remains the same, the base appears healthy. We are continuing to use endoform she has been encouraged to use the brace to keep the elbow straight 3/25; the patient has an appointment at the [facility] wound care center on 4/2. I had actually put her out indefinitely however she seems to want to come back here every week. This week she complains of increased pain and malodor. Dimensions of the elbow wound are larger. We have been using endoform  4/8; the patient went to [facility] where they apparently gave her meta honey and some border foam with a Tubigrip. She has been using this for a week. She says she was very impressed with them there. They did not offer her any surgical consultation. She seems to be coming back here for a second opinion on this, she does not wish to drive to [facility] every week 4/22; still using Medihoney foam border and a Tubigrip. Actually do not think she has anything on the arm at all specifically she is not using her brace 5/6; she has been using medihoney without a lot of improvement. Undermining maximum at 9:00 at 0.5 cm may be somewhat better. She is still complaining of discomfort. She uses her elbow brace at night but is not using anything on the arm during the day, she finds it too restrictive 5/13; we switch the patient to endoform AG last week. She is complaining of more pain and worried about some circumferential erythema around the wound. I had planned to consider epifix in this wound however the patient came in with a request to see a plastic surgeon in [place] by the name of Dr. Harl Bowie who cared for a friend of hers. Noteworthy that I have already sent her to one plastic surgeon and she went for  another second opinion at [facility] and they apparently did not send her to a plastic surgeon nevertheless the patient is fairly convinced that she might benefit from a skin graft which I am doubtful. She also has rheumatoid arthritis and is on methotrexate. This was originally a surgical wound for a bursectomy a year or 2 ago Readmission: 06/28/19 on evaluation today patient presents today for reevaluation but this is due to a new issue her elbow has completely healed and looks excellent. Her right anterior lower leg has a skin tear which was sustained from her dog who jumped up on her and inadvertently scratch the area causing the skin tear. This happened yesterday. She is having some discomfort but fortunately nothing too significant which is good news. No fevers, chills, nausea, or vomiting noted at this time. The skin fortunately was knocked one completely off and we are gonna see about we approximate in the skin as best we can in using Steri-Strips to hold this in place obviously if we can get some of this to reattach that would be beneficial 07/05/2019 on evaluation today patient actually appears to be doing okay although unfortunately the skin flap that we were attempting to [place] down last week did not take. She is developing a lot of fluid underneath the wound area unfortunately which again is not ideal. I think that this necrotic tissue needs to be removed and again it actually just wiped off during the evaluation today as I was attempting to clean the wound there did not appear to be any significant issues underlying which is good although there was some purulent drainage I did want to go ahead and see about obtaining a culture from today in order to ensure that the Z-Pak that I placed her on earlier in the week was appropriate for treating what ever infection may be causing the issue currently. This was a deep wound culture obtained today. 07/12/2019 on evaluation today patient  actually appears to be doing quite well with regard to her right lower extremity ulcer all things considering. There is still little bit of hematoma not it that is noted in the central portion of the wound along with some  necrotic tissue but she is still having quite a bit of discomfort. For that reason I did not perform sharp debridement today although this wound does need some debridement of one type or another. I think we may attempt Iodoflex to see if this can be of benefit. Fortunately there is no signs of active infection at this point. 07/18/19 upon evaluation today patient appears to be doing better with regard to her ulcer on her right lower extremity. She's been tolerating the dressing changes without complication. The good news is she seems to be making excellent progress. Overall I'm pleased with there being no signs of infection. With that being said she does tell me that she's been having some discomfort with the wrap she's unsure of exactly what about the rafters causing her trouble. 07/25/2019 on evaluation today patient appears to be doing a little better in regard to her lower extremity ulcer. Unfortunately the alginate seems to be getting really stuck to the wound bed and surrounding periwound. Fortunately there is no signs of active infection at this time. There does not appear to be any evidence of infection currently. 08/01/2019 on evaluation today patient actually appears to be doing much better with regard to her right lower extremity ulcer. She has been [place], [place] V. ([id number]) tolerating the dressing changes without complication. Fortunately her wound does not show any signs of infection and seems to be making good progress. No fevers, chills, nausea, vomiting, or diarrhea. 08/08/2019 on evaluation today patient actually appears to be doing better with regard to her leg ulcer. She has been tolerating the dressing changes without complication. With that being said I think we  may want to switch the dressing up today just based on the appearance of the wound bed in general. Fortunately there is no evidence of infection currently. Her pain seems to be much better. Her son is present with her today. 08/15/2019 on evaluation today patient appears to be doing very well with regard to her right lower extremity ulcer. She still complains about feeling like the compression stocking/Tubigrip is too tight for her. Nonetheless I still think this is beneficial and is helping the wound to heal more effectively. Overall I am extremely pleased at this time with what I am seeing. 08/22/2019 on evaluation today patient actually appears to be doing quite well with regard to her right lower extremity. She has been tolerating the dressing changes without complication. Fortunately there is no signs of active infection at this time. She still complains about the Tubigrip but nonetheless I think this is something that is of utmost importance for her to continue to wear if she is going to see this area heal appropriately. 08/29/2019 on evaluation today patient actually appears to be doing well visually in regard to her wound. Unfortunately in regard to overall pain she seems to be having more pain today which I am somewhat concerned about. There does not appear to be any signs of active infection that I can tell but again with increased pain that is definitely a concern here today. I do think it may be time to switch up the dressing as well to something that will be a little bit more effective hopefully and new tissue growth she has done well with collagen in the past. 09/05/2019 on evaluation today patient actually appears to be doing well with regard to her leg ulcer. She has been tolerating the dressing changes without complication. Fortunately there is no signs of active infection. We did obtain a  wound culture last week due to the fact that she still is very touchy as far as the surface of the  wound is concerned. Unfortunately I am not sure what happened to that culture as there is no record in the computer system. Nonetheless we need to follow-up on this and try to see if we can identify what exactly happened with this specimen as we have not gotten a result back either obviously. 09/12/2019 on evaluation today patient actually appears to be showing signs of good improvement upon evaluation today. She states her pain is not nearly as bad as what it was which is also good news. She did have a wound culture which was reviewed today that showed no growth of bacteria which is good news. This coupled with the fact that her wound appears to be healing better, measuring smaller, and overall even the slough buildup is minimal compared to what we have seen previously, I feel like she is actually showing signs of excellent improvement today. 09/19/2019 on evaluation today patient appears to be doing well with regard to her wounds on evaluation today. She is showing signs of continued improvement week by week and everything is measuring significantly smaller which is great news. Overall very pleased with the progress that she has made. 09/26/2019 on evaluation today patient appears to be doing fairly well with regard to her right lower extremity ulcer. She does continue to have some drainage and again the smaller of the 2 wounds is not making as much progress as I like to see. We have been doing the silver collagen for some time I think we may want to switch to [facility] she has a little bit of hyper granular tissue this may help to keep things from becoming too dramatic in that regard. Fortunately there is no signs of infection at this time. 10/03/2019 on evaluation today patient appears to be doing fairly well with regard to her lower extremity ulcers at this time. She has been tolerating the dressing changes without complication. Fortunately the wound is measuring smaller which is great news  she continues to make great progress. 10/17/2019 on evaluation today patient appears to be doing well with regard to her wounds of her right lower extremity. She states she has been having some increased pain apparently we gave her a little bit smaller size Tubigrip last time she was here this may have been what was causing some issues. She is out of the Tubigrip that we had ordered for her and therefore did not have any of the smaller size. Fortunately there is no evidence of active infection at this time. No fever chills noted 10/24/2019 upon evaluation today patient's wounds actually appear to be showing signs of good improvement which is excellent news. She has been tolerating the dressing changes without complication and overall I am rather pleased at this point with how things seem to be progressing. The patient likewise is very happy. She still was not happy with having to use the Tubigrip she states that bunches up in her shoe. 10/31/2019 on evaluation today patient actually appears to be doing quite well with regard to her leg ulcer. She has been tolerating the dressing changes without complication. Fortunately there is no signs of active infection at this time. Overall I am very pleased with the progress that she is made. She does wonder if we could switch to the bordered adhesive Hydrofera Blue I think that something we can definitely look into for her. 11/15/2019 patient on  evaluation today appears to be making excellent progress. Her wound is significantly smaller and the wound bed appears to be dramatically improved compared to even the last visit. Fortunately she is having really little to no pain at this time. No fevers, chills, nausea, vomiting, or diarrhea. 11/25/2019 on evaluation today patient appears to be doing well with regard to her lower extremity ulcer. She has been tolerating dressing changes without complication. Fortunately there is no signs of active infection at this  time. No fevers, chills, nausea, vomiting, or diarrhea. 12/02/2019 on evaluation today patient unfortunately is not doing quite as well as what we would have liked to have seen based on last week's evaluation. Fortunately there is no signs of active infection at this time. With that being said she is continuing to experience some issues with hyper granulation she was doing much better with the [facility] prior to putting the contact layer in between her skin and the [facility] I think that we need to go back to that. She just can have to be very careful with removing the dressing in order to ensure it does not pull skin and get stuck. 12/16/2019 on evaluation today patient appears to be doing better her wound is measuring significantly smaller at this time compared to where things were previous. Fortunately there is no evidence of active infection at this point which is also good news. No fever chills noted. 12/23/2019 upon evaluation today patient appears to be doing excellent in regard to her lower extremity ulcer. She has been tolerating the dressing changes without complication. Fortunately there is no signs of active infection at this time. Overall I am very pleased with the fact that she is doing so well today. READMISSION 08/12/2020 [name] ([id number]) Mrs. Prange is a now 88 year old woman we have had in this clinic on at least 2 occasions. She spent a protracted period of time here for a postsurgical wound on her left olecranon and then more recently towards the end of last year earlier this year was in for a skin tear on her left lower leg caused by her dog. She comes in today with a reasonably acute area on her left forearm again apparently caused by her dog although it was not a bite injury. She was seen in urgent care and they managed to reoppose a lot of the skin tear although the patient states that is now reopened slightly. They have been using Neosporin. She has very  fragile skin probably a combination of chronic steroid use for rheumatoid arthritis [although I do not see that she is on that now] solar skin damage. I note that she takes methotrexate and Plaquenil for her rheumatoid arthritis 9/15; this patient had a skin tear on her left forearm from trauma with her dog. She was seen in urgent care and they reapposed most of the separated skin. Fortunately for her most of this is remained intact and the skin above and below the wound actually looks healthy. Where there was a separation and the 2 flaps of skin there is still an open area however the granulation tissue looks healthy here. We have been using Xeroform however I had like to change to [facility] 9/29; skin tear on her left arm from a dog scratch. The skin was reapposed in an urgent care and surprisingly stayed viable. We used Hydrofera Blue last time the wound is smaller 10/13; patient comes back to clinic after 2 weeks for a left arm wound secondary to  a dog scratch. The skin was reapposed and in urgent care. We have been using Hydrofera Blue and the wound is closed today. Readmission 02/17/2022 Ms. Shantoya Geurts is an 88 year old female with a past medical history of chronic venous insufficiency and lymphedema, COPD, rheumatoid arthritis and hypertension that presents with a 2-week history of wounds to her lower extremities bilaterally. She states she had a skin tag to the right posterior leg that came off and became a wound. She states the area has slowly gotten larger. She was seen by her primary care physician's office on 02/15/2022 for this issue. She was prescribed Bactroban and cefdinir. She has 2 other wounds 1 located to each leg. These were from trauma. She states one was from trying to put on compression stockings. Her ABIs in office on the left was 0.93 and 1.0 on the right. She currently denies systemic signs of infection. 02/24/2022 upon evaluation today patient appears to be doing well  currently in regard to her skin tears. In general I feel like she is making good progress. Fortunately I do not see any evidence of active infection locally or systemically which is great news. No fevers, chills, nausea, vomiting, or diarrhea. 03/04/2022 upon evaluation patient actually appears to be doing well in regard to the wounds on her legs. She has been tolerating the dressing changes without complication. Overall I think all are showing signs of improvement which is great news. 03/10/2022 upon evaluation today patient appears to be doing better in regard to her wounds in general. I am very pleased with where things stand. I do believe that she is showing signs of good improvement which is great news. No fevers, chills, nausea, vomiting, or diarrhea. 03-21-2022 upon evaluation today patient appears to be doing well with regard to her wound on the left leg and the anterior right leg both are showing signs of significant improvement. Unfortunately she has a lot of slough and biofilm buildup on the surface of the posterior wound which is not good. I do not see any signs of active infection locally or systemically which is great news. No fevers, chills, nausea, vomiting, or diarrhea. 03-29-2022 upon evaluation today patient appears to be doing well currently in regard to her wounds. She has been tolerating the dressing changes without complication. Fortunately there does not appear to be any signs of active infection at this time which is great news. No fevers, chills, nausea, vomiting, or diarrhea. 4/27; right posterior calf and left lateral calf both initially traumatic in the setting of significant venous insufficiency. She did not tolerate compression wraps apparently because she could not get shoes on her feet and therefore we have been using Tubigrip's and silver alginate 5/8; right posterior calf 100% covered with adherent debris. The area on the left lateral really looks quite good under  illumination islands of new epithelialization. We have been using Xeroform on the left Iodoflex on the right bilateral 2 layer Tubigrip's 04-28-2022 upon evaluation today patient appears to be doing well with regard to her wound she has been tolerating the dressing changes which is great news. Overall I feel like the Xeroform is doing well on the left leg this is pretty much healed on the right leg she is very close to being healed as well I think we can probably switch over to Xeroform here as well. 05-05-2022 upon evaluation today patient appears to be doing excellent in regard to her wounds both are showing signs of being very close to complete resolution  which is awesome news. Fortunately I do not see any signs of active infection at this time which is great news and in general I think we are headed in the right direction. 05-13-2022 upon evaluation today patient appears to be doing well with regard to her wounds. She however does have some breakdown due to the fact that I think is staying too moist on the left lateral aspect. The right is also staying a little bit too moist in my opinion. Subsequently I think that we may want to switch the dressings currently. I would recommend switching over to a silver cell dressing. Objective Constitutional Well-nourished and well-hydrated in no acute distress. Vitals Time Taken: 2:49 PM, Height: 60 in, Weight: 132 lbs, BMI: 25.8, Temperature: 98.1 F, Pulse: 61 bpm, Respiratory Rate: 18 breaths/min, Blood Pressure: 131/57 mmHg. ARIJANA, NARAYAN VMarland Kitchen ([id number]) Respiratory normal breathing without difficulty. Psychiatric this patient is able to make decisions and demonstrates good insight into disease process. Alert and Oriented x 3. pleasant and cooperative. General Notes: Patient's wound bed showed signs of good granulation and epithelization at this point. Fortunately I do not see any signs of infection locally or systemically which is great and overall I  am extremely pleased with where things stand today. Integumentary (Hair, Skin) Wound #13 status is Open. Original cause of wound was Trauma. The date acquired was: 02/09/2022. The wound has been in treatment 12 weeks. The wound is located on the Left Lower Leg. The wound measures 3cm length x 1.9cm width x 0.1cm depth; 4.477cm^2 area and 0.448cm^3 volume. There is Fat Layer (Subcutaneous Tissue) exposed. There is a medium amount of serosanguineous drainage noted. There is large (67- 100%) granulation within the wound bed. There is no necrotic tissue within the wound bed. Wound #15 status is Open. Original cause of wound was Trauma. The date acquired was: 02/09/2022. The wound has been in treatment 12 weeks. The wound is located on the Right Lower Leg. The wound measures 1.3cm length x 0.9cm width x 0.1cm depth; 0.919cm^2 area and 0.092cm^3 volume. There is Fat Layer (Subcutaneous Tissue) exposed. There is a medium amount of serosanguineous drainage noted. There is small (1-33%) red granulation within the wound bed. There is a large (67-100%) amount of necrotic tissue within the wound bed including Adherent Slough. Assessment Active Problems ICD-10 Laceration without foreign body, left lower leg, initial encounter Laceration without foreign body, right lower leg, initial encounter Non-pressure chronic ulcer of other part of right lower leg with fat layer exposed Non-pressure chronic ulcer of other part of left lower leg with fat layer exposed Venous insufficiency (chronic) (peripheral) Plan Follow-up Appointments: Return Appointment in 1 week. Bathing/ Shower/ Hygiene: May shower; gently cleanse wound with antibacterial soap, rinse and pat dry prior to dressing wounds No tub bath. Anesthetic (Use 'Patient Medications' Section for Anesthetic Order Entry): Lidocaine applied to wound bed Edema Control - Lymphedema / Segmental Compressive Device / Other: Tubigrip double layer applied - size  c Elevate, Exercise Daily and Avoid Standing for Long Periods of Time. Elevate legs to the level of the heart and pump ankles as often as possible Elevate leg(s) parallel to the floor when sitting. Off-Loading: Turn and reposition every 2 hours - recommend prop legs/heels so wounds do not touch the bed/chair Additional Orders / Instructions: Follow Nutritious Diet and Increase Protein Intake WOUND #13: - Lower Leg Wound Laterality: Left Cleanser: Byram Ancillary Kit - 15 Day Supply (Generic) Every Other Day/30 Days Discharge Instructions: Use supplies as instructed; Kit  contains: (15) Saline Bullets; (15) 3x3 Gauze; 15 pr Gloves Cleanser: Soap and Water Every Other Day/30 Days Discharge Instructions: Gently cleanse wound with antibacterial soap, rinse and pat dry prior to dressing wounds Primary Dressing: Silvercel Small 2x2 (in/in) (DME) (Generic) Every Other Day/30 Days Discharge Instructions: Apply Silvercel Small 2x2 (in/in) as instructed Secondary Dressing: ABD Pad 5x9 (in/in) (Generic) Every Other Day/30 Days Discharge Instructions: Cover with ABD pad Secured With: Medipore Tape - 14M Medipore H Soft Cloth Surgical Tape, 2x2 (in/yd) (Generic) Every Other Day/30 Days Secured With: Kerlix Roll Sterile or Non-Sterile 6-ply 4.5x4 (yd/yd) Every Other Day/30 Days Discharge Instructions: Apply Kerlix as directed WOUND #15: - Lower Leg Wound Laterality: Right Cleanser: Byram Ancillary Kit - 15 Day Supply (Generic) Every Other Day/30 Days Discharge Instructions: Use supplies as instructed; Kit contains: (15) Saline Bullets; (15) 3x3 Gauze; 15 pr Gloves Cleanser: Soap and Water Every Other Day/30 Days Discharge Instructions: Gently cleanse wound with antibacterial soap, rinse and pat dry prior to dressing wounds Primary Dressing: Silvercel Small 2x2 (in/in) (DME) (Generic) Every Other Day/30 Days MIRAYAH, WREN ([id number]) Discharge Instructions: Apply Silvercel Small 2x2 (in/in) as  instructed Secondary Dressing: ABD Pad 5x9 (in/in) (Generic) Every Other Day/30 Days Discharge Instructions: Cover with ABD pad Secured With: Medipore Tape - 14M Medipore H Soft Cloth Surgical Tape, 2x2 (in/yd) (Generic) Every Other Day/30 Days Secured With: Kerlix Roll Sterile or Non-Sterile 6-ply 4.5x4 (yd/yd) Every Other Day/30 Days Discharge Instructions: Apply Kerlix as directed 1. I am glad suggested we go and continue with the Tubigrip I think that is doing well. 2. Also, recommend switching to a silver alginate dressing I think this can do better than the Xeroform. 3. I would also recommend the patient continue to elevate her legs much as possible to help with edema control I think this is still to be important. 4. With regard to removing the silver cell I did recommend that she wet this down really well before removal in order to make sure that it does not stick to any good skin. We will see patient back for reevaluation in 1 week here in the clinic. If anything worsens or changes patient will contact our office for additional recommendations. Electronic Signature(s) Signed: 06/06/2022 8:42:13 AM By: Gretta Cool, BSN, RN, CWS, Kim RN, BSN Signed: 06/06/2022 8:42:36 AM By: Worthy Keeler PA-C Previous Signature: 05/13/2022 3:14:50 PM Version By: Worthy Keeler PA-C Entered By: Gretta Cool BSN, RN, CWS, Kim on 06/06/2022 08:42:13 [name] ([id number]) -------------------------------------------------------------------------------- SuperBill Details Patient Name: [name] Date of Service: 05/13/2022 Medical Record Number: [id number] Patient Account Number: [id number] Date of Birth/Sex: [birth year] (88 y.o. F) Treating RN: Cornell Barman Primary Care Provider: Fulton Reek Other Clinician: Massie Kluver Referring Provider: Fulton Reek Treating Provider/Extender: Skipper Cliche in Treatment: 12 Diagnosis Coding ICD-10 Codes Code Description [phone] Laceration without  foreign body, left lower leg, initial encounter S81.811A Laceration without foreign body, right lower leg, initial encounter L97.812 Non-pressure chronic ulcer of other part of right lower leg with fat layer exposed L97.822 Non-pressure chronic ulcer of other part of left lower leg with fat layer exposed I87.2 Venous insufficiency (chronic) (peripheral) Physician Procedures CPT4 Code: [id number] Description: 99213 - WC PHYS LEVEL 3 - EST PT Modifier: Quantity: 1 CPT4 Code: Description: ICD-10 Diagnosis Description S81.812A Laceration without foreign body, left lower leg, initial encounter S81.811A Laceration without foreign body, right lower leg, initial encounter L97.812 Non-pressure chronic ulcer of other part of right  lower leg with fat  la [phone] Non-pressure chronic ulcer of other part of left lower leg with fat lay Modifier: yer exposed er exposed Quantity: Electronic Signature(s) Signed: 05/16/2022 4:20:03 PM By: Worthy Keeler PA-C Signed: 05/17/2022 4:20:44 PM By: Massie Kluver Previous Signature: 05/13/2022 3:15:07 PM Version By: Worthy Keeler PA-C Entered By: Massie Kluver on 05/13/2022 16:43:22

## 2022-05-24 ENCOUNTER — Encounter: Payer: Medicare Other | Admitting: Internal Medicine

## 2022-05-24 DIAGNOSIS — L97812 Non-pressure chronic ulcer of other part of right lower leg with fat layer exposed: Secondary | ICD-10-CM | POA: Diagnosis not present

## 2022-05-24 DIAGNOSIS — M419 Scoliosis, unspecified: Secondary | ICD-10-CM | POA: Diagnosis not present

## 2022-05-24 DIAGNOSIS — J449 Chronic obstructive pulmonary disease, unspecified: Secondary | ICD-10-CM | POA: Diagnosis not present

## 2022-05-24 DIAGNOSIS — L97822 Non-pressure chronic ulcer of other part of left lower leg with fat layer exposed: Secondary | ICD-10-CM | POA: Diagnosis not present

## 2022-05-24 DIAGNOSIS — I10 Essential (primary) hypertension: Secondary | ICD-10-CM | POA: Diagnosis not present

## 2022-05-24 DIAGNOSIS — S81812A Laceration without foreign body, left lower leg, initial encounter: Secondary | ICD-10-CM | POA: Diagnosis not present

## 2022-05-24 DIAGNOSIS — I872 Venous insufficiency (chronic) (peripheral): Secondary | ICD-10-CM | POA: Diagnosis not present

## 2022-05-24 DIAGNOSIS — M069 Rheumatoid arthritis, unspecified: Secondary | ICD-10-CM | POA: Diagnosis not present

## 2022-05-24 DIAGNOSIS — Z950 Presence of cardiac pacemaker: Secondary | ICD-10-CM | POA: Diagnosis not present

## 2022-05-24 DIAGNOSIS — X58XXXA Exposure to other specified factors, initial encounter: Secondary | ICD-10-CM | POA: Diagnosis not present

## 2022-05-24 DIAGNOSIS — S81811A Laceration without foreign body, right lower leg, initial encounter: Secondary | ICD-10-CM | POA: Diagnosis present

## 2022-05-24 NOTE — Progress Notes (Signed)
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 the dressing changes without complication. With that being said she does note that she's been having a lot of burning with the current dressing which is specifically the Iodoflex. Obviously this is a known side effect of the iodine in the dressing and I believe that may be giving her trouble. No fevers, chills, nausea, or vomiting noted at this time. Otherwise the wound does appear to be doing well. 01/30/18 on evaluation today patient appears  to be doing well in regard to her right anterior lower extremity ulcer. She notes that this does seem to be smaller and she wonders why we did not start the Prisma dressing sooner since it has made such a big difference in such a short amount of time. I explained that obviously we have to wait for the wound to get to a certain point along his healing path before we can initiate the Prisma otherwise it will not be effective. Therefore once the wound became clean it was then time to initiate the Prisma. Nonetheless good news is she is noting excellent improvement she does still have some discomfort but nothing as significant as previously noted. 04/17/18 on evaluation today patient appears to be doing very well and in fact her right lower extremity ulcer has completely healed at this point I'm pleased with this. The left lower extremity ulcer seem to be doing better although she still does have some openings noted the Prisma I think is helping more than the Xeroform was in my pinion. With that being said she still has a lot of healing to do in this regard. 04/27/18 on evaluation today patient appears to be doing very well in regard to her left lower Trinity ulcers. She has been tolerating the dressing changes without complication. I do have a note from her orthopedic surgeon today and they would like for me to help with treating her left elbow surgery site where she had the bursa removed and this was performed roughly 4 weeks ago according to the note that I reviewed. She has been placed on Bactrim DS by need for her leg wounds this probably helped a little bit with the left elbow surgery site. Obviously I do think this is something we can try to help her out with. 05/04/18 on evaluation today patient appears to be doing well in regard to her left anterior lower Trinity ulcers. She is making good progress which is great news. Unfortunately her elbow which we are also managing at  this point in time has not  made as much progress unfortunately. She has been tolerating the dressing changes without complication. She did see Dr. Benson Setting earlier today and he states that he's willing to give this three weeks to see if she's making any progress with wound care. However he states that she's really not then he will need to go back in and perform further surgery. Obviously she is trying to avoid surgery if at all possible although I'm not sure if this is going to be possible or least not that quickly. 05/11/18 on evaluation today patient appears to be doing very well in regard to her left lower extremity ulcers. Unfortunately in regard to her elbow this is very slow coming about as far as any improvement is concerned. I do feel like there may be a little bit more granulation noted in the base of the wound but nothing too significant unfortunately. I still can probe bone in the proximal portion of the wound which obviously explain to [name], [name]. ([id number]) the patient is not good. She will be having a follow-up with her orthopedic surgeon in the next couple of weeks. In the meantime we are trying to do as much as we can to try to show signs of improvement in healing to avoid the need for any additional and further surgery. Nonetheless I explained to the patient yet again today I'm not sure if that is going to be feasible or not obviously it's more risk for her to continue to have an open wound with bone exposure then to the back in for additional surgery even though I know she doesn't want to go that route. 05/15/18 on evaluation today patient presents for follow-up concerning her ongoing lower extremity ulcers on the left as well as the left elbow ulcer. She has at this point in time been tolerating the dressing changes without complication. Her left lower extremity ulcer appears to be doing very well. In regard to the left elbow ulcer she actually does seem to have additional granulation today which is good news.  I am definitely seeing signs of improvement although obviously this is somewhat slow improvement. Nonetheless I'm hopeful we will be able to avoid her having to have any further surgery but again that would definitely be a conversation between herself as well as her surgeon once he sees her for reevaluation. Otherwise she does want to see about having a three order compression stockings for her today 05/21/18 on evaluation today patient appears to be doing well in regard to her left lower surety ulcer. This is almost completely healed and seems to be progressing very nicely. With that being said her left elbow is another story. I'm not really convinced in the past three weeks we've seen a significant improvement in this wound. With that being said if this is something that there is no surgical option for him we have to continue to work on this from the standpoint of conservative management with wound care she may make improvement given time. Nonetheless it appears that her surgeon is somewhat concerned about the possibility of infection and really is leaning towards additional surgery to try and help close this wound. Nonetheless the patient is still unsure of exactly what to do. 05/29/18 on evaluation today patient appears to be doing well in regard to her left lower extremity ulcer. She's been tolerating the dressing changes without complication which is good news. With that being said she's been having issues specifically with her elbow  she did see her surgeon Dr. Roland Rack and he is recommending a repeat surgery to the left elbow in order to correct the issue. The patient is still somewhat unsure of this but feels like this may be better than trying to take time to let this heal over a longer period of time through normal wound care measures. Again I explained that I agree this may be a faster way to go if her surgeon feels that this is indeed a good direction to take. Obviously only he can make the  judgment on whether or not the surgery would likely be successful. 06/04/18 on evaluation today patient actually presents for follow-up concerning her left elbow and left lower from the ulcer she seems to be doing very well at this point in time. She has been tolerating the dressing changes without complication. With that being said her elbow is not significantly better she actually is scheduled for surgery tomorrow. 07/04/18; the patient had an area on her left leg that is remaining closed. The open area she has now is a postsurgical wound on the left elbow. I think we have clearance from the surgeon to see this now. We're using Prisma 07/11/18; we're currently dealing with a surgical wound on the left olecranon process. The patient complains of a lot of pain and drainage. When I saw her last week we did an x-ray that showed soft tissue wound and probable elbow joint effusion but no erosion to suggest osteomyelitis. The culture I did of this was somewhat surprisingly negative. She has a small open wound with not a viable surface there is considerable undermining relative to the wound size. She is on methotrexate for rheumatoid arthritis/overlap syndrome also plaquenil. We've been using silver collagen 07/18/18-She is seen in follow-up evaluation for a left elbow wound. There is essentially no change. She is currently on Zithromax and will complete that on Friday, there is no indication to extend this. We will change to iodosorb/iodoflex and monitor for response 07/25/18-She is seen in follow-up evaluation for left elbow wound. The wound is stable with no overt evidence of infection. She has counseled with her rheumatologist. She is wanting to restart her methotrexate; a culture was obtained to rule out occult infection before starting her methotrexate. We will continue with Iodosorb/Iodoflex and she will follow-up next week. 08/01/18; this is a difficult wound over her left olecranon process. There is been  concerned about infection although cultures including one done last week were negative. Pending 3 weeks ago I gave her an empiric course of antibiotics. She is having a lot of rheumatologic pain in her hands with pain and stiffness. She wants to go on her weekly methotrexate and I think it would be reasonable to do so. We have been using Iodoflex 08/01/18; difficult wound over her left olecranon process. She started back on methotrexate last week because of rheumatologic pain in her hands. We have been using Iodoflex to try and clean out the wound bed. She has been approved for Graphix PL 08/15/18; 2 week follow-up. Difficult wound over her left olecranon process. Graphix PL #1 with collagen backing 08/22/18; one-week follow-up. Difficult wound over her left olecranon process. Graphix PL #2 08/29/18; no major improvement. Difficult wound over her left olecranon process. Still considerable undermining. Graphics PL #3 1 week follow-up. Graphix #4 09/12/18 graphics #5. Some improvement in wound area although the undermining superiorly still has not closed down as much as I would like 09/19/18; Graphix #6 I think there is improvement  in the undermining from 7 to 9:00. Wound bed looks healthy. 09/26/18 Graffix #7 undermining is 0.5 cm maximally at roughly 8:00. From 12 to 7:00 the tissue is adherent which is a major improvement there is some advancing skin from this side. 10/03/18; Graphix #8 no major changes from last week 10/10/18 Graffix #9 There are improvements. There appears to be granulation coming up to the surface here and there is a lot less undermining at 8:00. 10/17/18. Graffix #10; Dimensions are improved less undermining surface felt the but the wound is still open. Initially a surgical wound following a bursectomy 10/24/18; Graffix #11. This is really stalled over the last 2 weeks. If there is no further improvement this will be the last application.The final option for this difficult area would  be plastic surgery and will set up a consult with Dr. Lorayne Bender in [facility] 10/31/18; wound looks about the same. The undermining superiorly is 0.7 cm. On the lateral edges perhaps some improvement there is no drainage. 11-07-2018 patient seen today for follow-up and management of left elbow wound. She has completed a total of 11 treatments of the graffix with not much improvement. She has an upcoming appointment with plastic surgery to assist with additional treatment options for the left elbow wound on 11/19/18. There is significant amount surrounding undermining of the wound is 0.9 cm. Currently prescribed methotrexate. Wound is being treated with Indoform and border dressing. No drainage from wound. No fever, chills. or pain. 11/21/18; the patient continues to have the wound looking roughly the same with undermining from about 12 to 6:00. This has not changed all that much. She does have skin irritation around the wound that looks like drainage maceration issues. The patient states that she was not able to have her wound dressing changed because of illness in the person he usually does this. She also did not attend her clinic appointment today with Dr. Lorayne Bender because of transportation issues. She is rebooked for some time in mid January 11/28/2018; the patient has less undermining using endoform. As a understandings she saw Dr. Doy Mince who is Dr. Rebekah Chesterfield partner. He recommended putting her in a elbow brace and I believe is written a prescription for it. He also recommended Motrin 800 mg 3 times daily. This is prescription strength ibuprofen although he did not write his prescription. This apparently was for 2 weeks. Culture I did last time grew a few methicillin sensitive staph aureus. After some difficulty due to drug intolerances/allergies and drug interactions I settled on a 5-day course of azithromycin 12/03/18 on evaluation today patient actually appears to be doing fairly well in  regard to her elbow when compared to last time I evaluated her. With that being said there does not appear to be any signs of infection at this time. That was the big concern currently as far as the patient was concerned. Nonetheless I do feel like she is making progress in regard to the feeling of this ulcer it has been slow. She did see a plastic surgeon MELODEE, LUPE ([id number]) they are talking about putting her in a brace in order to allow this area to heal more appropriately. 12/19/2018; not much change in this from the last time I have saw this.'s much smaller area than when she first came in and with less circumferential undermining however this is never really adhered. She is wearing the brace that was given or prescribed to her by plastics. She did not have a procedure offered to attempt to close  this. We have been using endoform 1/15; wound actually is not doing as well as last week. She was actually not supposed to come into this clinic again until next week but apparently her attendant noticed some redness increasing pain and she came in early. She reports the same amount of drainage. We have been using endoform. She is approved through puraply however I will only consider starting that next week 1/22; she completed the antibiotics last week. Culture I did was negative. In spite of this there is less erythema and pain complaints in the wound. Puraply #1 applied today 1/29; Puraply #2 today. Wound surface looks a lot better post debridement of adherent fibrinous material. However undermining from 6-12 is measuring worse 2/5; Puraply #3. Using her elbow brace 2/12 puraply #4 2/19 puraply #5. The 9:00 undermining measured at 0.5 cm. Undermining from 4-11 o'clock. Surface of the wound looks better and the circumference of the wound is smaller however the undermining is not really changed 2/26; still not much improvement. She has undermining from 4-9 o'clock 0.9 cm. Surface of the wound  covered and adherent debris. 3/4; still no improvement. Undermining from 4-9 o'clock still around a centimeter. Surface of the wound looks somewhat better. No debridement is required we used endoform after we ended the trial of puraply last week 3/11; really no improvement at all. Still 1 cm undermining from roughly 9-3 o'clock. This is about a centimeter. The base of the wound looks fairly healthy. No debridement. We have been using endoform. I am really out of most usual options here. I could consider either another round of an amniotic advanced treatment product example epifix or perhaps regranex. Understandably the patient is a bit frustrated. We did send her to plastic surgery for a consult. Other than prescribing her a brace to immobilize the elbow they did not think she was a candidate for any further surgery. Notable that the patient is not using the brace today 3/18-Patient returns for attention to the left elbow area which apparently looked red at the home health visit. Patient's elbow looks the same if not better compared with last visit. The area of ulceration remains the same, the base appears healthy. We are continuing to use endoform she has been encouraged to use the brace to keep the elbow straight 3/25; the patient has an appointment at the [facility] wound care center on 4/2. I had actually put her out indefinitely however she seems to want to come back here every week. This week she complains of increased pain and malodor. Dimensions of the elbow wound are larger. We have been using endoform 4/8; the patient went to [facility] where they apparently gave her meta honey and some border foam with a Tubigrip. She has been using this for a week. She says she was very impressed with them there. They did not offer her any surgical consultation. She seems to be coming back here for a second opinion on this, she does not wish to drive to [facility] every week 4/22; still using Medihoney foam border and a  Tubigrip. Actually do not think she has anything on the arm at all specifically she is not using her brace 5/6; she has been using medihoney without a lot of improvement. Undermining maximum at 9:00 at 0.5 cm may be somewhat better. She is still complaining of discomfort. She uses her elbow brace at night but is not using anything on the arm during the day, she finds it too restrictive 5/13; we switch the  patient to endoform AG last week. She is complaining of more pain and worried about some circumferential erythema around the wound. I had planned to consider epifix in this wound however the patient came in with a request to see a plastic surgeon in [place] by the name of Dr. Harl Bowie who cared for a friend of hers. Noteworthy that I have already sent her to one plastic surgeon and she went for another second opinion at [facility] and they apparently did not send her to a plastic surgeon nevertheless the patient is fairly convinced that she might benefit from a skin graft which I am doubtful. She also has rheumatoid arthritis and is on methotrexate. This was originally a surgical wound for a bursectomy a year or 2 ago Readmission: 06/28/19 on evaluation today patient presents today for reevaluation but this is due to a new issue her elbow has completely healed and looks excellent. Her right anterior lower leg has a skin tear which was sustained from her dog who jumped up on her and inadvertently scratch the area causing the skin tear. This happened yesterday. She is having some discomfort but fortunately nothing too significant which is good news. No fevers, chills, nausea, or vomiting noted at this time. The skin fortunately was knocked one completely off and we are gonna see about we approximate in the skin as best we can in using Steri-Strips to hold this in place obviously if we can get some of this to reattach that would be beneficial 07/05/2019 on evaluation today patient actually appears to be  doing okay although unfortunately the skin flap that we were attempting to [place] down last week did not take. She is developing a lot of fluid underneath the wound area unfortunately which again is not ideal. I think that this necrotic tissue needs to be removed and again it actually just wiped off during the evaluation today as I was attempting to clean the wound there did not appear to be any significant issues underlying which is good although there was some purulent drainage I did want to go ahead and see about obtaining a culture from today in order to ensure that the Z-Pak that I placed her on earlier in the week was appropriate for treating what ever infection may be causing the issue currently. This was a deep wound culture obtained today. 07/12/2019 on evaluation today patient actually appears to be doing quite well with regard to her right lower extremity ulcer all things considering. There is still little bit of hematoma not it that is noted in the central portion of the wound along with some necrotic tissue but she is still having quite a bit of discomfort. For that reason I did not perform sharp debridement today although this wound does need some debridement of one type or another. I think we may attempt Iodoflex to see if this can be of benefit. Fortunately there is no signs of active infection at this point. 07/18/19 upon evaluation today patient appears to be doing better with regard to her ulcer on her right lower extremity. She's been tolerating the dressing changes without complication. The good news is she seems to be making excellent progress. Overall I'm pleased with there being no signs of infection. With that being said she does tell me that she's been having some discomfort with the wrap she's unsure of exactly what about the rafters causing her trouble. 07/25/2019 on evaluation today patient appears to be doing a little better in regard to her  lower extremity ulcer.  Unfortunately the alginate seems to be getting really stuck to the wound bed and surrounding periwound. Fortunately there is no signs of active infection at this time. There does not appear to be any evidence of infection currently. 08/01/2019 on evaluation today patient actually appears to be doing much better with regard to her right lower extremity ulcer. She has been tolerating the dressing changes without complication. Fortunately her wound does not show any signs of infection and seems to be making good progress. No fevers, chills, nausea, vomiting, or diarrhea. 08/08/2019 on evaluation today patient actually appears to be doing better with regard to her leg ulcer. She has been tolerating the dressing changes without complication. With that being said I think we may want to switch the dressing up today just based on the appearance of the wound bed in general. Fortunately there is no evidence of infection currently. Her pain seems to be much better. Her son is present with her ELZINA, DEVERA ([id number]) today. 08/15/2019 on evaluation today patient appears to be doing very well with regard to her right lower extremity ulcer. She still complains about feeling like the compression stocking/Tubigrip is too tight for her. Nonetheless I still think this is beneficial and is helping the wound to heal more effectively. Overall I am extremely pleased at this time with what I am seeing. 08/22/2019 on evaluation today patient actually appears to be doing quite well with regard to her right lower extremity. She has been tolerating the dressing changes without complication. Fortunately there is no signs of active infection at this time. She still complains about the Tubigrip but nonetheless I think this is something that is of utmost importance for her to continue to wear if she is going to see this area heal appropriately. 08/29/2019 on evaluation today patient actually appears to be doing well visually in  regard to her wound. Unfortunately in regard to overall pain she seems to be having more pain today which I am somewhat concerned about. There does not appear to be any signs of active infection that I can tell but again with increased pain that is definitely a concern here today. I do think it may be time to switch up the dressing as well to something that will be a little bit more effective hopefully and new tissue growth she has done well with collagen in the past. 09/05/2019 on evaluation today patient actually appears to be doing well with regard to her leg ulcer. She has been tolerating the dressing changes without complication. Fortunately there is no signs of active infection. We did obtain a wound culture last week due to the fact that she still is very touchy as far as the surface of the wound is concerned. Unfortunately I am not sure what happened to that culture as there is no record in the computer system. Nonetheless we need to follow-up on this and try to see if we can identify what exactly happened with this specimen as we have not gotten a result back either obviously. 09/12/2019 on evaluation today patient actually appears to be showing signs of good improvement upon evaluation today. She states her pain is not nearly as bad as what it was which is also good news. She did have a wound culture which was reviewed today that showed no growth of bacteria which is good news. This coupled with the fact that her wound appears to be healing better, measuring smaller, and overall even the slough buildup is  minimal compared to what we have seen previously, I feel like she is actually showing signs of excellent improvement today. 09/19/2019 on evaluation today patient appears to be doing well with regard to her wounds on evaluation today. She is showing signs of continued improvement week by week and everything is measuring significantly smaller which is great news. Overall very pleased with the  progress that she has made. 09/26/2019 on evaluation today patient appears to be doing fairly well with regard to her right lower extremity ulcer. She does continue to have some drainage and again the smaller of the 2 wounds is not making as much progress as I like to see. We have been doing the silver collagen for some time I think we may want to switch to [facility] she has a little bit of hyper granular tissue this may help to keep things from becoming too dramatic in that regard. Fortunately there is no signs of infection at this time. 10/03/2019 on evaluation today patient appears to be doing fairly well with regard to her lower extremity ulcers at this time. She has been tolerating the dressing changes without complication. Fortunately the wound is measuring smaller which is great news she continues to make great progress. 10/17/2019 on evaluation today patient appears to be doing well with regard to her wounds of her right lower extremity. She states she has been having some increased pain apparently we gave her a little bit smaller size Tubigrip last time she was here this may have been what was causing some issues. She is out of the Tubigrip that we had ordered for her and therefore did not have any of the smaller size. Fortunately there is no evidence of active infection at this time. No fever chills noted 10/24/2019 upon evaluation today patient's wounds actually appear to be showing signs of good improvement which is excellent news. She has been tolerating the dressing changes without complication and overall I am rather pleased at this point with how things seem to be progressing. The patient likewise is very happy. She still was not happy with having to use the Tubigrip she states that bunches up in her shoe. 10/31/2019 on evaluation today patient actually appears to be doing quite well with regard to her leg ulcer. She has been tolerating the dressing changes without complication.  Fortunately there is no signs of active infection at this time. Overall I am very pleased with the progress that she is made. She does wonder if we could switch to the bordered adhesive Hydrofera Blue I think that something we can definitely look into for her. 11/15/2019 patient on evaluation today appears to be making excellent progress. Her wound is significantly smaller and the wound bed appears to be dramatically improved compared to even the last visit. Fortunately she is having really little to no pain at this time. No fevers, chills, nausea, vomiting, or diarrhea. 11/25/2019 on evaluation today patient appears to be doing well with regard to her lower extremity ulcer. She has been tolerating dressing changes without complication. Fortunately there is no signs of active infection at this time. No fevers, chills, nausea, vomiting, or diarrhea. 12/02/2019 on evaluation today patient unfortunately is not doing quite as well as what we would have liked to have seen based on last week's evaluation. Fortunately there is no signs of active infection at this time. With that being said she is continuing to experience some issues with hyper granulation she was doing much better with the [facility] prior  to putting the contact layer in between her skin and the [facility] I think that we need to go back to that. She just can have to be very careful with removing the dressing in order to ensure it does not pull skin and get stuck. 12/16/2019 on evaluation today patient appears to be doing better her wound is measuring significantly smaller at this time compared to where things were previous. Fortunately there is no evidence of active infection at this point which is also good news. No fever chills noted. 12/23/2019 upon evaluation today patient appears to be doing excellent in regard to her lower extremity ulcer. She has been tolerating the dressing changes without complication. Fortunately there is no  signs of active infection at this time. Overall I am very pleased with the fact that she is doing so well today. READMISSION 08/12/2020 Mrs. Axelson is a now 88 year old woman we have had in this clinic on at least 2 occasions. She spent a protracted period of time here for a postsurgical wound on her left olecranon and then more recently towards the end of last year earlier this year was in for a skin tear on her left lower leg caused by her dog. She comes in today with a reasonably acute area on her left forearm again apparently caused by her dog although it was not a bite injury. She was seen in urgent care and they managed to reoppose a lot of the skin tear although the patient states that is now reopened slightly. They have been using Neosporin. She has very fragile skin probably a combination of chronic steroid use for rheumatoid SHILYNN, HOCH ([id number]) arthritis [although I do not see that she is on that now] solar skin damage. I note that she takes methotrexate and Plaquenil for her rheumatoid arthritis 9/15; this patient had a skin tear on her left forearm from trauma with her dog. She was seen in urgent care and they reapposed most of the separated skin. Fortunately for her most of this is remained intact and the skin above and below the wound actually looks healthy. Where there was a separation and the 2 flaps of skin there is still an open area however the granulation tissue looks healthy here. We have been using Xeroform however I had like to change to [facility] 9/29; skin tear on her left arm from a dog scratch. The skin was reapposed in an urgent care and surprisingly stayed viable. We used Hydrofera Blue last time the wound is smaller 10/13; patient comes back to clinic after 2 weeks for a left arm wound secondary to a dog scratch. The skin was reapposed and in urgent care. We have been using Hydrofera Blue and the wound is closed today. Readmission 02/17/2022 Ms. Natasia Sanko is an 88 year old female with a past medical history of chronic venous insufficiency and lymphedema, COPD, rheumatoid arthritis and hypertension that presents with a 2-week history of wounds to her lower extremities bilaterally. She states she had a skin tag to the right posterior leg that came off and became a wound. She states the area has slowly gotten larger. She was seen by her primary care physician's office on 02/15/2022 for this issue. She was prescribed Bactroban and cefdinir. She has 2 other wounds 1 located to each leg. These were from trauma. She states one was from trying to put on compression stockings. Her ABIs in office on the left was 0.93 and 1.0 on the right. She currently denies systemic  signs of infection. 02/24/2022 upon evaluation today patient appears to be doing well currently in regard to her skin tears. In general I feel like she is making good progress. Fortunately I do not see any evidence of active infection locally or systemically which is great news. No fevers, chills, nausea, vomiting, or diarrhea. 03/04/2022 upon evaluation patient actually appears to be doing well in regard to the wounds on her legs. She has been tolerating the dressing changes without complication. Overall I think all are showing signs of improvement which is great news. 03/10/2022 upon evaluation today patient appears to be doing better in regard to her wounds in general. I am very pleased with where things stand. I do believe that she is showing signs of good improvement which is great news. No fevers, chills, nausea, vomiting, or diarrhea. 03-21-2022 upon evaluation today patient appears to be doing well with regard to her wound on the left leg and the anterior right leg both are showing signs of significant improvement. Unfortunately she has a lot of slough and biofilm buildup on the surface of the posterior wound which is not good. I do not see any signs of active infection locally or  systemically which is great news. No fevers, chills, nausea, vomiting, or diarrhea. 03-29-2022 upon evaluation today patient appears to be doing well currently in regard to her wounds. She has been tolerating the dressing changes without complication. Fortunately there does not appear to be any signs of active infection at this time which is great news. No fevers, chills, nausea, vomiting, or diarrhea. 4/27; right posterior calf and left lateral calf both initially traumatic in the setting of significant venous insufficiency. She did not tolerate compression wraps apparently because she could not get shoes on her feet and therefore we have been using Tubigrip's and silver alginate 5/8; right posterior calf 100% covered with adherent debris. The area on the left lateral really looks quite good under illumination islands of new epithelialization. We have been using Xeroform on the left Iodoflex on the right bilateral 2 layer Tubigrip's 04-28-2022 upon evaluation today patient appears to be doing well with regard to her wound she has been tolerating the dressing changes which is great news. Overall I feel like the Xeroform is doing well on the left leg this is pretty much healed on the right leg she is very close to being healed as well I think we can probably switch over to Xeroform here as well. 05-05-2022 upon evaluation today patient appears to be doing excellent in regard to her wounds both are showing signs of being very close to complete resolution which is awesome news. Fortunately I do not see any signs of active infection at this time which is great news and in general I think we are headed in the right direction. 05-13-2022 upon evaluation today patient appears to be doing well with regard to her wounds. She however does have some breakdown due to the fact that I think is staying too moist on the left lateral aspect. The right is also staying a little bit too moist in my opinion. Subsequently I  think that we may want to switch the dressings currently. I would recommend switching over to a silver cell dressing. 6/13; apparently the patient was felt to be having home health by [facility] therefore she did not receive supplies for her wound care. Current dressing remained on for for 5 days. Removed today by her intake nurse with purulent drainage and the left lateral lower leg  is considerably larger also slightly larger on the right. They are changing this usually every second day. At this point I am not really sure why the patient flagged as having home health Objective Constitutional Sitting or standing Blood Pressure is within target range for patient.. Pulse regular and within target range for patient.Marland Kitchen Respirations regular, non- labored and within target range.. Temperature is normal and within the target range for the patient.Marland Kitchen appears in no distress. Vitals Time Taken: 10:15 AM, Height: 60 in, Weight: 132 lbs, BMI: 25.8, Temperature: 98.3 F, Pulse: 78 bpm, Respiratory Rate: 16 breaths/min, Blood Pressure: 141/69 mmHg. PINKEY, MCJUNKIN ([id number]) General Notes: Wound exam; the area on the left lateral lower leg is considerably larger than her last visit. The surface of this does not look bad however there is erythema superiorly around the posterior aspect of the wound and increased tenderness. Also larger on the right although overall this is a much smaller wounded area Integumentary (Hair, Skin) Wound #13 status is Open. Original cause of wound was Trauma. The date acquired was: 02/09/2022. The wound has been in treatment 13 weeks. The wound is located on the Left Lower Leg. The wound measures 12cm length x 8cm width x 0.1cm depth; 75.398cm^2 area and 7.54cm^3 volume. There is Fat Layer (Subcutaneous Tissue) exposed. There is no tunneling or undermining noted. There is a medium amount of serosanguineous drainage noted. There is large (67-100%) granulation within the wound bed. There is  no necrotic tissue within the wound bed. Wound #15 status is Open. Original cause of wound was Trauma. The date acquired was: 02/09/2022. The wound has been in treatment 13 weeks. The wound is located on the Right Lower Leg. The wound measures 6cm length x 3cm width x 0.1cm depth; 14.137cm^2 area and 1.414cm^3 volume. There is Fat Layer (Subcutaneous Tissue) exposed. There is no tunneling or undermining noted. There is a medium amount of serosanguineous drainage noted. There is small (1-33%) red granulation within the wound bed. There is a large (67-100%) amount of necrotic tissue within the wound bed including Adherent Slough. Assessment Active Problems ICD-10 Laceration without foreign body, left lower leg, initial encounter Laceration without foreign body, right lower leg, initial encounter Non-pressure chronic ulcer of other part of right lower leg with fat layer exposed Non-pressure chronic ulcer of other part of left lower leg with fat layer exposed Venous insufficiency (chronic) (peripheral) Plan Follow-up Appointments: Return Appointment in 1 week. Bathing/ Shower/ Hygiene: May shower; gently cleanse wound with antibacterial soap, rinse and pat dry prior to dressing wounds No tub bath. Anesthetic (Use 'Patient Medications' Section for Anesthetic Order Entry): Lidocaine applied to wound bed Edema Control - Lymphedema / Segmental Compressive Device / Other: Tubigrip double layer applied - size c Elevate, Exercise Daily and Avoid Standing for Long Periods of Time. Elevate legs to the level of the heart and pump ankles as often as possible Elevate leg(s) parallel to the floor when sitting. Off-Loading: Turn and reposition every 2 hours - recommend prop legs/heels so wounds do not touch the bed/chair Additional Orders / Instructions: Follow Nutritious Diet and Increase Protein Intake The following medication(s) was prescribed: sulfamethoxazole-trimethoprim oral 800 mg-160 mg tablet 1  tablet oral bid for 5 days for cellulitis left leg starting 05/24/2022 WOUND #13: - Lower Leg Wound Laterality: Left Cleanser: Byram Ancillary Kit - 15 Day Supply (Generic) Every Other Day/30 Days Discharge Instructions: Use supplies as instructed; Kit contains: (15) Saline Bullets; (15) 3x3 Gauze; 15 pr Gloves Cleanser: Soap and  Water Every Other Day/30 Days Discharge Instructions: Gently cleanse wound with antibacterial soap, rinse and pat dry prior to dressing wounds Primary Dressing: Silvercel Small 2x2 (in/in) (Generic) Every Other Day/30 Days Discharge Instructions: Apply Silvercel Small 2x2 (in/in) as instructed Secondary Dressing: ABD Pad 5x9 (in/in) (Generic) Every Other Day/30 Days Discharge Instructions: Cover with ABD pad Secured With: Medipore Tape - 68M Medipore H Soft Cloth Surgical Tape, 2x2 (in/yd) (Generic) Every Other Day/30 Days Secured With: Kerlix Roll Sterile or Non-Sterile 6-ply 4.5x4 (yd/yd) Every Other Day/30 Days Discharge Instructions: Apply Kerlix as directed WOUND #15: - Lower Leg Wound Laterality: Right Cleanser: Byram Ancillary Kit - 15 Day Supply (Generic) Every Other Day/30 Days Discharge Instructions: Use supplies as instructed; Kit contains: (15) Saline Bullets; (15) 3x3 Gauze; 15 pr Gloves Cleanser: Soap and Water Every Other Day/30 Days Discharge Instructions: Gently cleanse wound with antibacterial soap, rinse and pat dry prior to dressing wounds Primary Dressing: Silvercel Small 2x2 (in/in) (Generic) Every Other Day/30 Days Discharge Instructions: Apply Silvercel Small 2x2 (in/in) as instructed Secondary Dressing: ABD Pad 5x9 (in/in) (Generic) Every Other Day/30 Days ARIN, VANOSDOL ([id number]) Discharge Instructions: Cover with ABD pad Secured With: Medipore Tape - 68M Medipore H Soft Cloth Surgical Tape, 2x2 (in/yd) (Generic) Every Other Day/30 Days Secured With: Kerlix Roll Sterile or Non-Sterile 6-ply 4.5x4 (yd/yd) Every Other Day/30  Days Discharge Instructions: Apply Kerlix as directed 1. I empirically put her on Septra DS for 5 days 2. Concern with regards to periwound cellulitis 3. Still the same dressing 4. The edema control on the left leg is marginal. I had some discussion about putting her in compression but for this visit we kept her using the Tubigrip changed every second day with silver alginate as the primary dressing Electronic Signature(s) Unsigned Entered By: Linton Ham on 05/24/2022 11:02:58 Signature(s): Date(s): ILIANY, LOSIER ([id number]) -------------------------------------------------------------------------------- [place] Details Patient Name: SAMIKSHA, PELLICANO Date of Service: 05/24/2022 Medical Record Number: [id number] Patient Account Number: [id number] Date of Birth/Sex: [birth year] (88 y.o. F) Treating RN: Carlene Coria Primary Care Provider: Fulton Reek Other Clinician: Referring Provider: Fulton Reek Treating Provider/Extender: Tito Dine in Treatment: 13 Diagnosis Coding ICD-10 Codes Code Description [phone] Laceration without foreign body, left lower leg, initial encounter S81.811A Laceration without foreign body, right lower leg, initial encounter L97.812 Non-pressure chronic ulcer of other part of right lower leg with fat layer exposed L97.822 Non-pressure chronic ulcer of other part of left lower leg with fat layer exposed I87.2 Venous insufficiency (chronic) (peripheral) Facility Procedures CPT4 Code: [id number] Description: 99213 - WOUND CARE VISIT-LEV 3 EST PT Modifier: Quantity: 1 Physician Procedures CPT4 Code: [id number] Description: 78242 - WC PHYS LEVEL 3 - EST PT Modifier: Quantity: 1 CPT4 Code: Description: ICD-10 Diagnosis Description S81.812A Laceration without foreign body, left lower leg, initial encounter L97.822 Non-pressure chronic ulcer of other part of left lower leg with fat lay L97.812 Non-pressure chronic ulcer of other part of   right lower leg with fat la I87.2 Venous insufficiency (chronic) (peripheral) Modifier: er exposed yer exposed Quantity: Electronic Signature(s) Unsigned Entered By: Linton Ham on 05/24/2022 11:03:45 Signature(s): Date(s):

## 2022-05-24 NOTE — Progress Notes (Signed)
VANNA, SAILER ([id number]) Visit Report for 05/24/2022 Arrival Information Details Patient Name: [name], [name] Date of Service: 05/24/2022 10:15 AM Medical Record Number: [id number] Patient Account Number: [id number] Date of Birth/Sex: [birth year] (88 y.o. F) Treating RN: Carlene Coria Primary Care Kenneth Cuaresma: Fulton Reek Other Clinician: Referring Jasman Pfeifle: Fulton Reek Treating Takai Chiaramonte/Extender: Tito Dine in Treatment: 61 Visit Information History Since Last Visit All ordered tests and consults were completed: No Patient Arrived: Wheel Chair Added or deleted any medications: No Arrival Time: 10:09 Any new allergies or adverse reactions: No Accompanied By: caregiver Had a fall or experienced change in No Transfer Assistance: None activities of daily living that may affect Patient Identification Verified: Yes risk of falls: Secondary Verification Process Completed: Yes Signs or symptoms of abuse/neglect since last visito No Patient Requires Transmission-Based Precautions: No Hospitalized since last visit: No Patient Has Alerts: Yes Implantable device outside of the clinic excluding No Patient Alerts: NOT diabetic cellular tissue based products placed in the center since last visit: Has Dressing in Place as Prescribed: Yes Has Compression in Place as Prescribed: Yes Pain Present Now: Yes Electronic Signature(s) Signed: 05/24/2022 2:49:16 PM By: Carlene Coria RN Entered By: Carlene Coria on 05/24/2022 10:15:43 [name] ([id number]) -------------------------------------------------------------------------------- Clinic Level of Care Assessment Details Patient Name: [name] Date of Service: 05/24/2022 10:15 AM Medical Record Number: [id number] Patient Account Number: [id number] Date of Birth/Sex: 1930/03/31 (88 y.o. F) Treating RN: Carlene Coria Primary Care Thor Nannini: Fulton Reek Other Clinician: Referring Brenen Beigel: Fulton Reek Treating  Resa Rinks/Extender: Tito Dine in Treatment: 13 Clinic Level of Care Assessment Items TOOL 4 Quantity Score X - Use when only an EandM is performed on FOLLOW-UP visit 1 0 ASSESSMENTS - Nursing Assessment / Reassessment X - Reassessment of Co-morbidities (includes updates in patient status) 1 10 X- 1 5 Reassessment of Adherence to Treatment Plan ASSESSMENTS - Wound and Skin Assessment / Reassessment _0  - Simple Wound Assessment / Reassessment - one wound 0 X- 2 5 Complex Wound Assessment / Reassessment - multiple wounds _1  - 0 Dermatologic / Skin Assessment (not related to wound area) ASSESSMENTS - Focused Assessment _2  - Circumferential Edema Measurements - multi extremities 0 _3  - 0 Nutritional Assessment / Counseling / Intervention _4  - 0 Lower Extremity Assessment (monofilament, tuning fork, pulses) _5  - 0 Peripheral Arterial Disease Assessment (using hand held doppler) ASSESSMENTS - Ostomy and/or Continence Assessment and Care _6  - Incontinence Assessment and Management 0 _7  - 0 Ostomy Care Assessment and Management (repouching, etc.) PROCESS - Coordination of Care X - Simple Patient / Family Education for ongoing care 1 15 _8  - 0 Complex (extensive) Patient / Family Education for ongoing care _9  - 0 Staff obtains Programmer, systems, Records, Test Results / Process Orders _10  - 0 Staff telephones HHA, Nursing Homes / Clarify orders / etc _11  - 0 Routine Transfer to another Facility (non-emergent condition) _12  - 0 Routine Hospital Admission (non-emergent condition) _13  - 0 New Admissions / Biomedical engineer / Ordering NPWT, Apligraf, etc. _14  - 0 Emergency Hospital Admission (emergent condition) X- 1 10 Simple Discharge Coordination _15  - 0 Complex (extensive) Discharge Coordination PROCESS - Special Needs _16  - Pediatric / Minor Patient Management 0 _17  - 0 Isolation Patient Management _18  - 0 Hearing / Language / Visual special needs _19  - 0 Assessment of  Community assistance (transportation, D/C planning, etc.) _20  - 0 Additional assistance / Altered mentation _21  - 0 Support Surface(s) Assessment (bed, cushion, seat, etc.) INTERVENTIONS - Wound Cleansing /  Measurement [name], [name] ([id number]) X- 1 5 Simple Wound Cleansing - one wound _0  - 0 Complex Wound Cleansing - multiple wounds X- 1 5 Wound Imaging (photographs - any number of wounds) _1  - 0 Wound Tracing (instead of photographs) X- 1 5 Simple Wound Measurement - one wound _2  - 0 Complex Wound Measurement - multiple wounds INTERVENTIONS - Wound Dressings X - Small Wound Dressing one or multiple wounds 1 10 X- 1 15 Medium Wound Dressing one or multiple wounds _3  - 0 Large Wound Dressing one or multiple wounds <IPJASNKNLZJQBHAL>_9<\/FXTKWIOXBDZHGDJM>_4  - 0 Application of Medications - topical <QASTMHDQQIWLNLGX>_2<\/JJHERDEYCXKGYJEH>_6  - 0 Application of Medications - injection INTERVENTIONS - Miscellaneous _6  - External ear exam 0 _7  - 0 Specimen Collection (cultures, biopsies, blood, body fluids, etc.) _8  - 0 Specimen(s) / Culture(s) sent or taken to Lab for analysis _9  - 0 Patient Transfer (multiple staff / Civil Service fast streamer / Similar devices) _10  - 0 Simple Staple / Suture removal (25 or less) _11  - 0 Complex Staple / Suture removal (26 or more) _12  - 0 Hypo / Hyperglycemic Management (close monitor of Blood Glucose) _13  - 0 Ankle / Brachial Index (ABI) - do not check if billed separately X- 1 5 Vital Signs Has the patient been seen at the hospital within the last three years: Yes Total Score: 95 Level Of Care: New/Established - Level 3 Electronic Signature(s) Signed: 05/24/2022 2:49:16 PM By: Carlene Coria RN Entered By: Carlene Coria on 05/24/2022 10:52:15 [name] ([id number]) -------------------------------------------------------------------------------- Encounter Discharge Information Details Patient Name: [name] Date of Service: 05/24/2022 10:15 AM Medical Record Number: [id number] Patient Account Number:  [id number] Date of Birth/Sex: 1930-08-09 (88 y.o. F) Treating RN: Carlene Coria Primary Care Rakeen Gaillard: Fulton Reek Other Clinician: Referring Nohely Whitehorn: Fulton Reek Treating Katia Hannen/Extender: Tito Dine in Treatment: 13 Encounter Discharge Information Items Discharge Condition: Stable Ambulatory Status: Wheelchair Discharge Destination: Home Transportation: Private Auto Accompanied By: self Schedule Follow-up Appointment: Yes Clinical Summary of Care: Patient Declined Electronic Signature(s) Signed: 05/24/2022 2:49:16 PM By: Carlene Coria RN Entered By: Carlene Coria on 05/24/2022 10:53:21 [name] ([id number]) -------------------------------------------------------------------------------- Lower Extremity Assessment Details Patient Name: [name] Date of Service: 05/24/2022 10:15 AM Medical Record Number: [id number] Patient Account Number: [id number] Date of Birth/Sex: [birth year] (88 y.o. F) Treating RN: Carlene Coria Primary Care Durante Violett: Fulton Reek Other Clinician: Referring Flordia Kassem: Fulton Reek Treating Nalea Salce/Extender: Tito Dine in Treatment: 13 Edema Assessment Assessed: [Left: No] [Right: No] Edema: [Left: Ye] [Right: s] Calf Left: Right: Point of Measurement: 32 cm From Medial Instep 33 cm 35 cm Ankle Left: Right: Point of Measurement: 10 cm From Medial Instep 22 cm 20 cm Electronic Signature(s) Signed: 05/24/2022 2:49:16 PM By: Carlene Coria RN Entered By: Carlene Coria on 05/24/2022 10:32:47 [name] ([id number]) -------------------------------------------------------------------------------- Multi Wound Chart Details Patient Name: [name] Date of Service: 05/24/2022 10:15 AM Medical Record Number: [id number] Patient Account Number: [id number] Date of Birth/Sex: Oct 09, 1930 (88 y.o. F) Treating RN: Carlene Coria Primary Care Ron Beske: Fulton Reek Other Clinician: Referring Kamarian Sahakian: Fulton Reek Treating Katelinn Justice/Extender: Tito Dine in Treatment: 13 Vital Signs Height(in): 60 Pulse(bpm): 81 Weight(lbs): 132 Blood Pressure(mmHg): 141/69 Body Mass Index(BMI): 25.8 Temperature(F): 98.3 Respiratory Rate(breaths/min): 16 Photos: [N/A:N/A] Wound Location: Left Lower Leg Right Lower Leg N/A Wounding Event: Trauma Trauma N/A Primary Etiology: Skin Tear Venous Leg Ulcer N/A Comorbid History: Cataracts, Glaucoma, Optic Cataracts, Glaucoma, Optic N/A Neuritis, Chronic sinus Neuritis, Chronic sinus problems/congestion, Middle ear problems/congestion, Middle  ear problems, Hypertension, problems, Hypertension, Rheumatoid Arthritis Rheumatoid Arthritis Date Acquired: 02/09/2022 02/09/2022 N/A Weeks of Treatment: 13 13 N/A Wound Status: Open Open N/A Wound Recurrence: No No N/A Measurements L x W x D (cm) 12x8x0.1 6x3x0.1 N/A Area (cm) : 75.398 14.137 N/A Volume (cm) : 7.54 1.414 N/A % Reduction in Area: -4821.50% -20.00% N/A % Reduction in Volume: -4828.10% -20.00% N/A Classification: Full Thickness Without Exposed Full Thickness Without Exposed N/A Support Structures Support Structures Exudate Amount: Medium Medium N/A Exudate Type: Serosanguineous Serosanguineous N/A Exudate Color: red, brown red, brown N/A Granulation Amount: Large (67-100%) Small (1-33%) N/A Granulation Quality: N/A Red N/A Necrotic Amount: None Present (0%) Large (67-100%) N/A Exposed Structures: Fat Layer (Subcutaneous Tissue): Fat Layer (Subcutaneous Tissue): N/A Yes Yes Fascia: No Fascia: No Tendon: No Tendon: No Muscle: No Muscle: No Joint: No Joint: No Bone: No Bone: No Epithelialization: None None N/A Treatment Notes Electronic Signature(s) Signed: 05/24/2022 2:49:16 PM By: Carlene Coria RN Entered By: Carlene Coria on 05/24/2022 10:33:26 [name] ([id number]) [name], [name]  ([id number]) -------------------------------------------------------------------------------- Uncertain Details Patient Name: [name] Date of Service: 05/24/2022 10:15 AM Medical Record Number: [id number] Patient Account Number: [id number] Date of Birth/Sex: [birth year] (88 y.o. F) Treating RN: Carlene Coria Primary Care Johnwilliam Shepperson: Fulton Reek Other Clinician: Referring Deette Revak: Fulton Reek Treating Luisana Lutzke/Extender: Tito Dine in Treatment: 13 Active Inactive Wound/Skin Impairment Nursing Diagnoses: Knowledge deficit related to ulceration/compromised skin integrity Goals: Patient/caregiver will verbalize understanding of skin care regimen Date Initiated: 02/17/2022 Date Inactivated: 03/21/2022 Target Resolution Date: 03/20/2022 Goal Status: Met Ulcer/skin breakdown will have a volume reduction of 30% by week 4 Date Initiated: 02/17/2022 Date Inactivated: 03/21/2022 Target Resolution Date: 03/20/2022 Goal Status: Met Ulcer/skin breakdown will have a volume reduction of 50% by week 8 Date Initiated: 02/17/2022 Target Resolution Date: 04/19/2022 Goal Status: Active Ulcer/skin breakdown will have a volume reduction of 80% by week 12 Date Initiated: 02/17/2022 Target Resolution Date: 05/20/2022 Goal Status: Active Ulcer/skin breakdown will heal within 14 weeks Date Initiated: 02/17/2022 Target Resolution Date: 06/19/2022 Goal Status: Active Interventions: Assess patient/caregiver ability to obtain necessary supplies Assess patient/caregiver ability to perform ulcer/skin care regimen upon admission and as needed Assess ulceration(s) every visit Notes: Electronic Signature(s) Signed: 05/24/2022 2:49:16 PM By: Carlene Coria RN Entered By: Carlene Coria on 05/24/2022 10:33:17 [name] ([id number]) -------------------------------------------------------------------------------- Pain Assessment Details Patient Name: [name] Date of Service:  05/24/2022 10:15 AM Medical Record Number: [id number] Patient Account Number: [id number] Date of Birth/Sex: [birth year] (88 y.o. F) Treating RN: Carlene Coria Primary Care Kydan Shanholtzer: Fulton Reek Other Clinician: Referring Md Smola: Fulton Reek Treating Deanna Boehlke/Extender: Tito Dine in Treatment: 13 Active Problems Location of Pain Severity and Description of Pain Patient Has Paino Yes Site Locations With Dressing Change: Yes Duration of the Pain. Constant / Intermittento Intermittent How Long Does it Lasto Hours: Minutes: 15 Rate the pain. Current Pain Level: 0 Worst Pain Level: 10 Least Pain Level: 0 Tolerable Pain Level: 5 Character of Pain Describe the Pain: Aching, Burning Pain Management and Medication Current Pain Management: Medication: Yes Cold Application: No Rest: Yes Massage: No Activity: No T.E.N.S.: No Heat Application: No Leg drop or elevation: No Is the Current Pain Management Adequate: Inadequate How does your wound impact your activities of daily livingo Sleep: No Bathing: No Appetite: No Relationship With Others: No Bladder Continence: No Emotions: No Bowel Continence: No Work: No Toileting: No Drive: No Dressing: No Hobbies: No Electronic Signature(s) Signed: 05/24/2022  2:49:16 PM By: Carlene Coria RN Entered By: Carlene Coria on 05/24/2022 10:17:43 [name] ([id number]) -------------------------------------------------------------------------------- Patient/Caregiver Education Details Patient Name: [name] Date of Service: 05/24/2022 10:15 AM Medical Record Number: [id number] Patient Account Number: [id number] Date of Birth/Gender: [birth year] (88 y.o. F) Treating RN: Carlene Coria Primary Care Physician: Fulton Reek Other Clinician: Referring Physician: Fulton Reek Treating Physician/Extender: Tito Dine in Treatment: 13 Education Assessment Education Provided To: Patient Education  Topics Provided Wound/Skin Impairment: Methods: Explain/Verbal Responses: State content correctly Electronic Signature(s) Signed: 05/24/2022 2:49:16 PM By: Carlene Coria RN Entered By: Carlene Coria on 05/24/2022 10:52:36 [name] ([id number]) -------------------------------------------------------------------------------- Wound Assessment Details Patient Name: [name] Date of Service: 05/24/2022 10:15 AM Medical Record Number: [id number] Patient Account Number: [id number] Date of Birth/Sex: Jul 18, 1930 (88 y.o. F) Treating RN: Carlene Coria Primary Care Alonia Dibuono: Fulton Reek Other Clinician: Referring Alfonza Toft: Fulton Reek Treating Tayvion Lauder/Extender: Tito Dine in Treatment: 13 Wound Status Wound Number: 13 Primary Skin Tear Etiology: Wound Location: Left Lower Leg Wound Open Wounding Event: Trauma Status: Date Acquired: 02/09/2022 Comorbid Cataracts, Glaucoma, Optic Neuritis, Chronic sinus Weeks Of Treatment: 13 History: problems/congestion, Middle ear problems, Hypertension, Clustered Wound: No Rheumatoid Arthritis Photos Wound Measurements Length: (cm) 12 Width: (cm) 8 Depth: (cm) 0.1 Area: (cm) 75.398 Volume: (cm) 7.54 % Reduction in Area: -4821.5% % Reduction in Volume: -4828.1% Epithelialization: None Tunneling: No Undermining: No Wound Description Classification: Full Thickness Without Exposed Support Structu Exudate Amount: Medium Exudate Type: Serosanguineous Exudate Color: red, brown res Foul Odor After Cleansing: No Slough/Fibrino No Wound Bed Granulation Amount: Large (67-100%) Exposed Structure Necrotic Amount: None Present (0%) Fascia Exposed: No Fat Layer (Subcutaneous Tissue) Exposed: Yes Tendon Exposed: No Muscle Exposed: No Joint Exposed: No Bone Exposed: No Treatment Notes Wound #13 (Lower Leg) Wound Laterality: Left Cleanser [place]ram Ancillary Kit - 15 Day Supply Discharge Instruction: Use supplies as  instructed; Kit contains: (15) Saline Bullets; (15) 3x3 Gauze; 15 pr Gloves Soap and [address] [name], [name] ([id number]) Discharge Instruction: Gently cleanse wound with antibacterial soap, rinse and pat dry prior to dressing wounds Peri-Wound Care Topical Primary Dressing [place]cel Small 2x2 (in/in) Discharge Instruction: Apply [place]cel Small 2x2 (in/in) as instructed Secondary Dressing ABD Pad 5x9 (in/in) Discharge Instruction: Cover with ABD pad Secured With Medipore Tape - 19M Medipore H Soft Cloth Surgical Tape, 2x2 (in/yd) Kerlix Roll Sterile or Non-Sterile 6-ply 4.5x4 (yd/yd) Discharge Instruction: Apply Kerlix as directed Compression Wrap Compression Stockings Add-Ons Electronic Signature(s) Signed: 05/24/2022 2:49:16 PM By: Carlene Coria RN Entered By: Carlene Coria on 05/24/2022 10:27:19 [name] ([id number]) -------------------------------------------------------------------------------- Wound Assessment Details Patient Name: [name] Date of Service: 05/24/2022 10:15 AM Medical Record Number: [id number] Patient Account Number: [id number] Date of Birth/Sex: Apr 09, 1930 (88 y.o. F) Treating RN: Carlene Coria Primary Care Margorie Renner: Fulton Reek Other Clinician: Referring Lynx Goodrich: Fulton Reek Treating Leylany Nored/Extender: Tito Dine in Treatment: 13 Wound Status Wound Number: 15 Primary Venous Leg Ulcer Etiology: Wound Location: Right Lower Leg Wound Open Wounding Event: Trauma Status: Date Acquired: 02/09/2022 Comorbid Cataracts, Glaucoma, Optic Neuritis, Chronic sinus Weeks Of Treatment: 13 History: problems/congestion, Middle ear problems, Hypertension, Clustered Wound: No Rheumatoid Arthritis Photos Wound Measurements Length: (cm) 6 Width: (cm) 3 Depth: (cm) 0.1 Area: (cm) 14.137 Volume: (cm) 1.414 % Reduction in Area: -20% % Reduction in Volume: -20% Epithelialization: None Tunneling: No Undermining: No Wound  Description Classification: Full Thickness Without Exposed Support Structu Exudate Amount: Medium Exudate Type: Serosanguineous Exudate Color: red, brown  res Foul Odor After Cleansing: No Slough/Fibrino Yes Wound Bed Granulation Amount: Small (1-33%) Exposed Structure Granulation Quality: Red Fascia Exposed: No Necrotic Amount: Large (67-100%) Fat Layer (Subcutaneous Tissue) Exposed: Yes Necrotic Quality: Adherent Slough Tendon Exposed: No Muscle Exposed: No Joint Exposed: No Bone Exposed: No Treatment Notes Wound #15 (Lower Leg) Wound Laterality: Right Cleanser Byram Ancillary Kit - 15 Day Supply Discharge Instruction: Use supplies as instructed; Kit contains: (15) Saline Bullets; (15) 3x3 Gauze; 15 pr Gloves Soap and [address] [name], [name] ([id number]) Discharge Instruction: Gently cleanse wound with antibacterial soap, rinse and pat dry prior to dressing wounds Peri-Wound Care Topical Primary Dressing Silvercel Small 2x2 (in/in) Discharge Instruction: [place] 2x2 (in/in) as instructed Secondary Dressing ABD Pad 5x9 (in/in) Discharge Instruction: Cover with ABD pad Secured With Medipore Tape - 43M Medipore H Soft Cloth Surgical Tape, 2x2 (in/yd) Kerlix Roll Sterile or Non-Sterile 6-ply 4.5x4 (yd/yd) Discharge Instruction: Apply Kerlix as directed Compression Wrap Compression Stockings Add-Ons Electronic Signature(s) Signed: 05/24/2022 2:49:16 PM By: Carlene Coria RN Entered By: Carlene Coria on 05/24/2022 10:28:08 [name] ([id number]) -------------------------------------------------------------------------------- Vitals Details Patient Name: [name] Date of Service: 05/24/2022 10:15 AM Medical Record Number: [id number] Patient Account Number: [id number] Date of Birth/Sex: [birth year] (88 y.o. F) Treating RN: Carlene Coria Primary Care Avyn Coate: Fulton Reek Other Clinician: Referring Zenith Lamphier: Fulton Reek Treating Keirstin Musil/Extender:  Tito Dine in Treatment: 13 Vital Signs Time Taken: 10:15 Temperature (F): 98.3 Height (in): 60 Pulse (bpm): 78 Weight (lbs): 132 Respiratory Rate (breaths/min): 16 Body Mass Index (BMI): 25.8 Blood Pressure (mmHg): 141/69 Reference Range: 80 - 120 mg / dl Electronic Signature(s) Signed: 05/24/2022 2:49:16 PM By: Carlene Coria RN Entered By: Carlene Coria on 05/24/2022 10:16:14

## 2022-05-31 ENCOUNTER — Encounter: Payer: Medicare Other | Admitting: Physician Assistant

## 2022-05-31 DIAGNOSIS — S81811A Laceration without foreign body, right lower leg, initial encounter: Secondary | ICD-10-CM | POA: Diagnosis not present

## 2022-05-31 NOTE — Progress Notes (Signed)
[name], [name] ([id number]) Visit Report for 05/31/2022 Arrival Information Details Patient Name: [name], [name]. Date of Service: 05/31/2022 10:15 AM Medical Record Number: [id number] Patient Account Number: [id number] Date of Birth/Sex: 1930/12/04 (88 y.o. F) Treating RN: Carlene Coria Primary Care Elide Stalzer: Fulton Reek Other Clinician: Referring Dvaughn Fickle: Fulton Reek Treating Verenise Moulin/Extender: Skipper Cliche in Treatment: 14 Visit Information History Since Last Visit All ordered tests and consults were completed: No Patient Arrived: Wheel Chair Added or deleted any medications: No Arrival Time: 10:20 Any new allergies or adverse reactions: No Accompanied By: son Had a fall or experienced change in No Transfer Assistance: None activities of daily living that may affect Patient Identification Verified: Yes risk of falls: Secondary Verification Process Completed: Yes Signs or symptoms of abuse/neglect since last visito No Patient Requires Transmission-Based Precautions: No Hospitalized since last visit: No Patient Has Alerts: Yes Implantable device outside of the clinic excluding No Patient Alerts: NOT diabetic cellular tissue based products placed in the center since last visit: Has Dressing in Place as Prescribed: Yes Has Compression in Place as Prescribed: Yes Pain Present Now: No Electronic Signature(s) Unsigned Entered ByCarlene Coria on 05/31/2022 10:20:56 Signature(s): Date(s): [name], [name] ([id number]) -------------------------------------------------------------------------------- Clinic Level of Care Assessment Details Patient Name: [name]. Date of Service: 05/31/2022 10:15 AM Medical Record Number: [id number] Patient Account Number: [id number] Date of Birth/Sex: 1930/05/07 (88 y.o. F) Treating RN: Carlene Coria Primary Care Vance Belcourt: Fulton Reek Other Clinician: Referring Taydem Cavagnaro: Fulton Reek Treating Ella Guillotte/Extender: Skipper Cliche in Treatment: 14 Clinic Level of Care Assessment Items TOOL 4 Quantity Score X - Use when only an EandM is performed on FOLLOW-UP visit 1 0 ASSESSMENTS - Nursing Assessment / Reassessment X - Reassessment of Co-morbidities (includes updates in patient status) 1 10 X- 1 5 Reassessment of Adherence to Treatment Plan ASSESSMENTS - Wound and Skin Assessment / Reassessment '[]'  - Simple Wound Assessment / Reassessment - one wound 0 X- 2 5 Complex Wound Assessment / Reassessment - multiple wounds '[]'  - 0 Dermatologic / Skin Assessment (not related to wound area) ASSESSMENTS - Focused Assessment '[]'  - Circumferential Edema Measurements - multi extremities 0 '[]'  - 0 Nutritional Assessment / Counseling / Intervention '[]'  - 0 Lower Extremity Assessment (monofilament, tuning fork, pulses) '[]'  - 0 Peripheral Arterial Disease Assessment (using hand held doppler) ASSESSMENTS - Ostomy and/or Continence Assessment and Care '[]'  - Incontinence Assessment and Management 0 '[]'  - 0 Ostomy Care Assessment and Management (repouching, etc.) PROCESS - Coordination of Care X - Simple Patient / Family Education for ongoing care 1 15 '[]'  - 0 Complex (extensive) Patient / Family Education for ongoing care '[]'  - 0 Staff obtains Programmer, systems, Records, Test Results / Process Orders '[]'  - 0 Staff telephones HHA, Nursing Homes / Clarify orders / etc '[]'  - 0 Routine Transfer to another Facility (non-emergent condition) '[]'  - 0 Routine Hospital Admission (non-emergent condition) '[]'  - 0 New Admissions / Biomedical engineer / Ordering NPWT, Apligraf, etc. '[]'  - 0 Emergency Hospital Admission (emergent condition) '[]'  - 0 Simple Discharge Coordination '[]'  - 0 Complex (extensive) Discharge Coordination PROCESS - Special Needs '[]'  - Pediatric / Minor Patient Management 0 '[]'  - 0 Isolation Patient Management '[]'  - 0 Hearing / Language / Visual special needs '[]'  - 0 Assessment of Community assistance  (transportation, D/C planning, etc.) '[]'  - 0 Additional assistance / Altered mentation '[]'  - 0 Support Surface(s) Assessment (bed, cushion, seat, etc.) INTERVENTIONS - Wound Cleansing / Measurement [name], [name] ([id number]) '[]'  -  0 Simple Wound Cleansing - one wound X- 2 5 Complex Wound Cleansing - multiple wounds X- 1 5 Wound Imaging (photographs - any number of wounds) '[]'  - 0 Wound Tracing (instead of photographs) '[]'  - 0 Simple Wound Measurement - one wound X- 2 5 Complex Wound Measurement - multiple wounds INTERVENTIONS - Wound Dressings X - Small Wound Dressing one or multiple wounds 1 10 X- 1 15 Medium Wound Dressing one or multiple wounds '[]'  - 0 Large Wound Dressing one or multiple wounds '[]'  - 0 Application of Medications - topical '[]'  - 0 Application of Medications - injection INTERVENTIONS - Miscellaneous '[]'  - External ear exam 0 '[]'  - 0 Specimen Collection (cultures, biopsies, blood, body fluids, etc.) '[]'  - 0 Specimen(s) / Culture(s) sent or taken to Lab for analysis '[]'  - 0 Patient Transfer (multiple staff / Civil Service fast streamer / Similar devices) '[]'  - 0 Simple Staple / Suture removal (25 or less) '[]'  - 0 Complex Staple / Suture removal (26 or more) '[]'  - 0 Hypo / Hyperglycemic Management (close monitor of Blood Glucose) '[]'  - 0 Ankle / Brachial Index (ABI) - do not check if billed separately X- 1 5 Vital Signs Has the patient been seen at the hospital within the last three years: Yes Total Score: 95 Level Of Care: New/Established - Level 3 Electronic Signature(s) Unsigned Entered ByCarlene Coria on 05/31/2022 10:39:15 Signature(s): Date(s): [name], [name] ([id number]) -------------------------------------------------------------------------------- Encounter Discharge Information Details Patient Name: [name] Date of Service: 05/31/2022 10:15 AM Medical Record Number: [id number] Patient Account Number: [id number] Date of Birth/Sex: October 30, 1930 (88 y.o.  F) Treating RN: Carlene Coria Primary Care Callin Ashe: Fulton Reek Other Clinician: Referring Joshua Zeringue: Fulton Reek Treating Jakarius Flamenco/Extender: Skipper Cliche in Treatment: 14 Encounter Discharge Information Items Discharge Condition: Stable Ambulatory Status: Ambulatory Discharge Destination: Home Transportation: Private Auto Accompanied By: son Schedule Follow-up Appointment: Yes Clinical Summary of Care: Electronic Signature(s) Unsigned Entered ByCarlene Coria on 05/31/2022 10:40:28 Signature(s): Date(s): [name], [name] ([id number]) -------------------------------------------------------------------------------- Lower Extremity Assessment Details Patient Name: [name], [name]. Date of Service: 05/31/2022 10:15 AM Medical Record Number: [id number] Patient Account Number: [id number] Date of Birth/Sex: Jul 25, 1930 (88 y.o. F) Treating RN: Carlene Coria Primary Care Jance Siek: Fulton Reek Other Clinician: Referring Corry Storie: Fulton Reek Treating Kristeen Lantz/Extender: Skipper Cliche in Treatment: 14 Edema Assessment Assessed: [Left: No] [Right: No] Edema: [Left: No] [Right: No] Calf Left: Right: Point of Measurement: 32 cm From Medial Instep 33 cm 35 cm Ankle Left: Right: Point of Measurement: 10 cm From Medial Instep 22 cm 20 cm Vascular Assessment Pulses: Dorsalis Pedis Palpable: [Left:Yes] [Right:Yes] Electronic Signature(s) Unsigned Entered ByCarlene Coria on 05/31/2022 10:30:29 Signature(s): Date(s): NICLE, CONNOLE ([id number]) -------------------------------------------------------------------------------- Multi Wound Chart Details Patient Name: [name], [name]. Date of Service: 05/31/2022 10:15 AM Medical Record Number: [id number] Patient Account Number: [id number] Date of Birth/Sex: June 17, 1930 (88 y.o. F) Treating RN: Carlene Coria Primary Care Arleth Mccullar: Fulton Reek Other Clinician: Referring Deseray Daponte: Fulton Reek Treating  Kiante Petrovich/Extender: Skipper Cliche in Treatment: 14 Vital Signs Height(in): 60 Pulse(bpm): 66 Weight(lbs): 132 Blood Pressure(mmHg): 153/72 Body Mass Index(BMI): 25.8 Temperature(F): 98.2 Respiratory Rate(breaths/min): 18 Photos: [N/A:N/A] Wound Location: Left Lower Leg Right Lower Leg N/A Wounding Event: Trauma Trauma N/A Primary Etiology: Skin Tear Venous Leg Ulcer N/A Comorbid History: Cataracts, Glaucoma, Optic Cataracts, Glaucoma, Optic N/A Neuritis, Chronic sinus Neuritis, Chronic sinus problems/congestion, Middle ear problems/congestion, Middle ear problems, Hypertension, problems, Hypertension, Rheumatoid Arthritis Rheumatoid Arthritis Date Acquired: 02/09/2022 02/09/2022 N/A Weeks of Treatment: 14 14  N/A Wound Status: Open Open N/A Wound Recurrence: No No N/A Measurements L x W x D (cm) 11x6x0.1 2x1x0.1 N/A Area (cm) : 51.836 1.571 N/A Volume (cm) : 5.184 0.157 N/A % Reduction in Area: -3283.60% 86.70% N/A % Reduction in Volume: -3288.20% 86.70% N/A Classification: Full Thickness Without Exposed Full Thickness Without Exposed N/A Support Structures Support Structures Exudate Amount: Medium Medium N/A Exudate Type: Serosanguineous Serosanguineous N/A Exudate Color: red, brown red, brown N/A Granulation Amount: Large (67-100%) Small (1-33%) N/A Granulation Quality: N/A Red N/A Necrotic Amount: None Present (0%) Large (67-100%) N/A Exposed Structures: Fat Layer (Subcutaneous Tissue): Fat Layer (Subcutaneous Tissue): N/A Yes Yes Fascia: No Fascia: No Tendon: No Tendon: No Muscle: No Muscle: No Joint: No Joint: No Bone: No Bone: No Epithelialization: None None N/A Treatment Notes Electronic Signature(s) Unsigned Entered ByCarlene Coria on 05/31/2022 10:34:18 [name] ([id number]) Signature(s): Date(s): ZSOFIA, PROUT ([id number]) -------------------------------------------------------------------------------- [place]  Details Patient Name: [name], [name]. Date of Service: 05/31/2022 10:15 AM Medical Record Number: [id number] Patient Account Number: [id number] Date of Birth/Sex: [birth year] (88 y.o. F) Treating RN: Carlene Coria Primary Care Rivkah Wolz: Fulton Reek Other Clinician: Referring Haani Bakula: Fulton Reek Treating Feleica Fulmore/Extender: Skipper Cliche in Treatment: 14 Active Inactive Wound/Skin Impairment Nursing Diagnoses: Knowledge deficit related to ulceration/compromised skin integrity Goals: Patient/caregiver will verbalize understanding of skin care regimen Date Initiated: 02/17/2022 Date Inactivated: 03/21/2022 Target Resolution Date: 03/20/2022 Goal Status: Met Ulcer/skin breakdown will have a volume reduction of 30% by week 4 Date Initiated: 02/17/2022 Date Inactivated: 03/21/2022 Target Resolution Date: 03/20/2022 Goal Status: Met Ulcer/skin breakdown will have a volume reduction of 50% by week 8 Date Initiated: 02/17/2022 Target Resolution Date: 04/19/2022 Goal Status: Active Ulcer/skin breakdown will have a volume reduction of 80% by week 12 Date Initiated: 02/17/2022 Target Resolution Date: 05/20/2022 Goal Status: Active Ulcer/skin breakdown will heal within 14 weeks Date Initiated: 02/17/2022 Target Resolution Date: 06/19/2022 Goal Status: Active Interventions: Assess patient/caregiver ability to obtain necessary supplies Assess patient/caregiver ability to perform ulcer/skin care regimen upon admission and as needed Assess ulceration(s) every visit Notes: Electronic Signature(s) Unsigned Entered ByCarlene Coria on 05/31/2022 10:30:56 Signature(s): Date(s): DENIYA, CRAIGO ([id number]) -------------------------------------------------------------------------------- Pain Assessment Details Patient Name: [name], [name]. Date of Service: 05/31/2022 10:15 AM Medical Record Number: [id number] Patient Account Number: [id number] Date of Birth/Sex: 1930/08/02 (88 y.o. F) Treating RN:  Carlene Coria Primary Care Fronia Depass: Fulton Reek Other Clinician: Referring Coltan Spinello: Fulton Reek Treating Anthoni Geerts/Extender: Skipper Cliche in Treatment: 14 Active Problems Location of Pain Severity and Description of Pain Patient Has Paino No Site Locations Pain Management and Medication Current Pain Management: Electronic Signature(s) Unsigned Entered ByCarlene Coria on 05/31/2022 10:21:54 Signature(s): Date(s): KHRYSTAL, JEANMARIE ([id number]) -------------------------------------------------------------------------------- Patient/Caregiver Education Details Patient Name: [name], [name] Date of Service: 05/31/2022 10:15 AM Medical Record Number: [id number] Patient Account Number: [id number] Date of Birth/Gender: 02-Sep-1930 (88 y.o. F) Treating RN: Carlene Coria Primary Care Physician: Fulton Reek Other Clinician: Referring Physician: Fulton Reek Treating Physician/Extender: Skipper Cliche in Treatment: 14 Education Assessment Education Provided To: Patient Education Topics Provided Wound/Skin Impairment: Methods: Explain/Verbal Responses: State content correctly Electronic Signature(s) Unsigned Entered ByCarlene Coria on 05/31/2022 10:39:35 Signature(s): Date(s): [name], [name] ([id number]) -------------------------------------------------------------------------------- Wound Assessment Details Patient Name: [name], DIFONZO. Date of Service: 05/31/2022 10:15 AM Medical Record Number: [id number] Patient Account Number: [id number] Date of Birth/Sex: [birth year] (88 y.o. F) Treating RN: Carlene Coria Primary Care Elmer Boutelle: Fulton Reek Other Clinician: Referring Dione Petron: Fulton Reek Treating  Yamilee Harmes/Extender: Jeri Cos Weeks in Treatment: 14 Wound Status Wound Number: 13 Primary Skin Tear Etiology: Wound Location: Left Lower Leg Wound Open Wounding Event: Trauma Status: Date Acquired: 02/09/2022 Comorbid Cataracts, Glaucoma, Optic  Neuritis, Chronic sinus Weeks Of Treatment: 14 History: problems/congestion, Middle ear problems, Hypertension, Clustered Wound: No Rheumatoid Arthritis Photos Wound Measurements Length: (cm) 11 Width: (cm) 6 Depth: (cm) 0.1 Area: (cm) 51.836 Volume: (cm) 5.184 % Reduction in Area: -3283.6% % Reduction in Volume: -3288.2% Epithelialization: None Tunneling: No Undermining: No Wound Description Classification: Full Thickness Without Exposed Support Structu Exudate Amount: Medium Exudate Type: Serosanguineous Exudate Color: red, brown res Foul Odor After Cleansing: No Slough/Fibrino No Wound Bed Granulation Amount: Large (67-100%) Exposed Structure Necrotic Amount: None Present (0%) Fascia Exposed: No Fat Layer (Subcutaneous Tissue) Exposed: Yes Tendon Exposed: No Muscle Exposed: No Joint Exposed: No Bone Exposed: No Treatment Notes Wound #13 (Lower Leg) Wound Laterality: Left Cleanser Byram Ancillary Kit - 15 Day Supply Discharge Instruction: Use supplies as instructed; Kit contains: (15) Saline Bullets; (15) 3x3 Gauze; 15 pr Gloves Soap and [address] RETTIE, LAIRD ([id number]) Discharge Instruction: Gently cleanse wound with antibacterial soap, rinse and pat dry prior to dressing wounds Peri-Wound Care Topical Primary Dressing Silvercel Small 2x2 (in/in) Discharge Instruction: Apply Silvercel Small 2x2 (in/in) as instructed Secondary Dressing ABD Pad 5x9 (in/in) Discharge Instruction: Cover with ABD pad Secured With Medipore Tape - 62M Medipore H Soft Cloth Surgical Tape, 2x2 (in/yd) Kerlix Roll Sterile or Non-Sterile 6-ply 4.5x4 (yd/yd) Discharge Instruction: Apply Kerlix as directed Compression Wrap Compression Stockings Add-Ons Electronic Signature(s) Unsigned Entered ByCarlene Coria on 05/31/2022 10:29:17 Signature(s): Date(s): KITTIE, KRIZAN ([id number]) -------------------------------------------------------------------------------- Wound  Assessment Details Patient Name: SHEILYN, BOEHLKE. Date of Service: 05/31/2022 10:15 AM Medical Record Number: [id number] Patient Account Number: [id number] Date of Birth/Sex: 1930/01/25 (88 y.o. F) Treating RN: Carlene Coria Primary Care Nikeia Henkes: Fulton Reek Other Clinician: Referring Peony Barner: Fulton Reek Treating Emmerson Taddei/Extender: Skipper Cliche in Treatment: 14 Wound Status Wound Number: 15 Primary Venous Leg Ulcer Etiology: Wound Location: Right Lower Leg Wound Open Wounding Event: Trauma Status: Date Acquired: 02/09/2022 Comorbid Cataracts, Glaucoma, Optic Neuritis, Chronic sinus Weeks Of Treatment: 14 History: problems/congestion, Middle ear problems, Hypertension, Clustered Wound: No Rheumatoid Arthritis Photos Wound Measurements Length: (cm) 2 Width: (cm) 1 Depth: (cm) 0.1 Area: (cm) 1.571 Volume: (cm) 0.157 % Reduction in Area: 86.7% % Reduction in Volume: 86.7% Epithelialization: None Tunneling: No Undermining: No Wound Description Classification: Full Thickness Without Exposed Support Structures Exudate Amount: Medium Exudate Type: Serosanguineous Exudate Color: red, brown Foul Odor After Cleansing: No Slough/Fibrino Yes Wound Bed Granulation Amount: Small (1-33%) Exposed Structure Granulation Quality: Red Fascia Exposed: No Necrotic Amount: Large (67-100%) Fat Layer (Subcutaneous Tissue) Exposed: Yes Necrotic Quality: Adherent Slough Tendon Exposed: No Muscle Exposed: No Joint Exposed: No Bone Exposed: No Treatment Notes Wound #15 (Lower Leg) Wound Laterality: Right Cleanser Byram Ancillary Kit - 15 Day Supply Discharge Instruction: Use supplies as instructed; Kit contains: (15) Saline Bullets; (15) 3x3 Gauze; 15 pr Gloves Soap and [address] CONSUELO, SUTHERS ([id number]) Discharge Instruction: Gently cleanse wound with antibacterial soap, rinse and pat dry prior to dressing wounds Peri-Wound Care Topical Primary Dressing Silvercel Small  2x2 (in/in) Discharge Instruction: Apply Silvercel Small 2x2 (in/in) as instructed Secondary Dressing ABD Pad 5x9 (in/in) Discharge Instruction: Cover with ABD pad Secured With Medipore Tape - 62M Medipore H Soft Cloth Surgical Tape, 2x2 (in/yd) Kerlix Roll Sterile or Non-Sterile 6-ply 4.5x4 (yd/yd) Discharge Instruction: Apply Kerlix as directed  Compression Wrap Compression Stockings Add-Ons Electronic Signature(s) Unsigned Entered ByCarlene Coria on 05/31/2022 10:29:57 Signature(s): Date(s): KAYDANCE, BOWIE ([id number]) -------------------------------------------------------------------------------- [place] Details Patient Name: LAJOY, VANAMBURG Date of Service: 05/31/2022 10:15 AM Medical Record Number: [id number] Patient Account Number: [id number] Date of Birth/Sex: Oct 08, 1930 (88 y.o. F) Treating RN: Carlene Coria Primary Care Jerriah Ines: Fulton Reek Other Clinician: Referring Ida Uppal: Fulton Reek Treating Noelene Gang/Extender: Skipper Cliche in Treatment: 14 Vital Signs Time Taken: 10:21 Temperature (F): 98.2 Height (in): 60 Pulse (bpm): 76 Weight (lbs): 132 Respiratory Rate (breaths/min): 18 Body Mass Index (BMI): 25.8 Blood Pressure (mmHg): 153/72 Reference Range: 80 - 120 mg / dl Electronic Signature(s) Unsigned Entered ByCarlene Coria on 05/31/2022 10:21:43 Signature(s): Date(s):

## 2022-05-31 NOTE — Progress Notes (Signed)
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 Iodoflex to see if this can be of benefit. Fortunately there is no signs of active infection at this point. 07/18/19 upon evaluation today patient appears to be doing better with regard to her ulcer on her right lower extremity. She's been tolerating the dressing changes without complication. The good news is she seems to be making excellent progress. Overall I'm pleased with there being no signs of infection. With that being said she does tell me that she's been having some discomfort with the wrap she's unsure of exactly what about the rafters causing her trouble. 07/25/2019 on evaluation today patient appears to be doing a little better in regard to her lower extremity ulcer. Unfortunately the alginate seems to be getting really stuck to the wound bed and surrounding periwound. Fortunately there is no signs of active infection  at this time. There does not appear to be any evidence of infection currently. 08/01/2019 on evaluation today patient actually appears to be doing much better with regard to her right lower extremity ulcer. She has been tolerating the dressing changes without complication. Fortunately her wound does not show any signs of infection and seems to be making good progress. No fevers, chills, nausea, vomiting, or diarrhea. 08/08/2019 on  evaluation today patient actually appears to be doing better with regard to her leg ulcer. She has been tolerating the dressing changes without complication. With that being said I think we may want to switch the dressing up today just based on the appearance of the wound bed in general. Fortunately there is no evidence of infection currently. Her pain seems to be much better. Her son is present with her [name], [name] ([id number]) today. 08/15/2019 on evaluation today patient appears to be doing very well with regard to her right lower extremity ulcer. She still complains about feeling like the compression stocking/Tubigrip is too tight for her. Nonetheless I still think this is beneficial and is helping the wound to heal more effectively. Overall I am extremely pleased at this time with what I am seeing. 08/22/2019 on evaluation today patient actually appears to be doing quite well with regard to her right lower extremity. She has been tolerating the dressing changes without complication. Fortunately there is no signs of active infection at this time. She still complains about the Tubigrip but nonetheless I think this is something that is of utmost importance for her to continue to wear if she is going to see this area heal appropriately. 08/29/2019 on evaluation today patient actually appears to be doing well visually in regard to her wound. Unfortunately in regard to overall pain she seems to be having more pain today which I am somewhat concerned about. There does not appear to be any signs of active infection that I can tell but again with increased pain that is definitely a concern here today. I do think it may be time to switch up the dressing as well to something that will be a little bit more effective hopefully and new tissue growth she has done well with collagen in the past. 09/05/2019 on evaluation today patient actually appears to be doing well with regard to her leg ulcer. She has been  tolerating the dressing changes without complication. Fortunately there is no signs of active infection. We did obtain a wound culture last week due to the fact that she still is very touchy as far as the surface of the wound is concerned. Unfortunately I am not sure what happened to that culture as there is no record in the computer system. Nonetheless we need to follow-up on this and try to see if we can identify what exactly happened with this specimen as we have not gotten a result back either obviously. 09/12/2019 on evaluation today patient actually appears to be showing signs of good improvement upon evaluation today. She states her pain is not nearly as bad as what it was which is also good news. She did have a wound culture which was reviewed today that showed no growth of bacteria which is good news. This coupled with the fact that her wound appears to be healing better, measuring smaller, and overall even the slough buildup is minimal compared to what we have seen previously, I feel like she is actually showing signs of excellent improvement today. 09/19/2019 on evaluation today patient appears to  be doing well with regard to her wounds on evaluation today. She is showing signs of continued improvement week by week and everything is measuring significantly smaller which is great news. Overall very pleased with the progress that she has made. 09/26/2019 on evaluation today patient appears to be doing fairly well with regard to her right lower extremity ulcer. She does continue to have some drainage and again the smaller of the 2 wounds is not making as much progress as I like to see. We have been doing the silver collagen for some time I think we may want to switch to [facility] she has a little bit of hyper granular tissue this may help to keep things from becoming too dramatic in that regard. Fortunately there is no signs of infection at this time. 10/03/2019 on evaluation today patient  appears to be doing fairly well with regard to her lower extremity ulcers at this time. She has been tolerating the dressing changes without complication. Fortunately the wound is measuring smaller which is great news she continues to make great progress. 10/17/2019 on evaluation today patient appears to be doing well with regard to her wounds of her right lower extremity. She states she has been having some increased pain apparently we gave her a little bit smaller size Tubigrip last time she was here this may have been what was causing some issues. She is out of the Tubigrip that we had ordered for her and therefore did not have any of the smaller size. Fortunately there is no evidence of active infection at this time. No fever chills noted 10/24/2019 upon evaluation today patient's wounds actually appear to be showing signs of good improvement which is excellent news. She has been tolerating the dressing changes without complication and overall I am rather pleased at this point with how things seem to be progressing. The patient likewise is very happy. She still was not happy with having to use the Tubigrip she states that bunches up in her shoe. 10/31/2019 on evaluation today patient actually appears to be doing quite well with regard to her leg ulcer. She has been tolerating the dressing changes without complication. Fortunately there is no signs of active infection at this time. Overall I am very pleased with the progress that she is made. She does wonder if we could switch to the bordered adhesive Hydrofera Blue I think that something we can definitely look into for her. 11/15/2019 patient on evaluation today appears to be making excellent progress. Her wound is significantly smaller and the wound bed appears to be dramatically improved compared to even the last visit. Fortunately she is having really little to no pain at this time. No fevers, chills, nausea, vomiting, or diarrhea. 11/25/2019 on  evaluation today patient appears to be doing well with regard to her lower extremity ulcer. She has been tolerating dressing changes without complication. Fortunately there is no signs of active infection at this time. No fevers, chills, nausea, vomiting, or diarrhea. 12/02/2019 on evaluation today patient unfortunately is not doing quite as well as what we would have liked to have seen based on last week's evaluation. Fortunately there is no signs of active infection at this time. With that being said she is continuing to experience some issues with hyper granulation she was doing much better with the [facility] prior to putting the contact layer in between her skin and the [facility] I think that we need to go back to that. She just can have  to be very careful with removing the dressing in order to ensure it does not pull skin and get stuck. 12/16/2019 on evaluation today patient appears to be doing better her wound is measuring significantly smaller at this time compared to where things were previous. Fortunately there is no evidence of active infection at this point which is also good news. No fever chills noted. 12/23/2019 upon evaluation today patient appears to be doing excellent in regard to her lower extremity ulcer. She has been tolerating the dressing changes without complication. Fortunately there is no signs of active infection at this time. Overall I am very pleased with the fact that she is doing so well today. READMISSION 08/12/2020 [name] is a now 88 year old woman we have had in this clinic on at least 2 occasions. She spent a protracted period of time here for a postsurgical wound on her left olecranon and then more recently towards the end of last year earlier this year was in for a skin tear on her left lower leg caused by her dog. She comes in today with a reasonably acute area on her left forearm again apparently caused by her dog although it was not a bite injury. She  was seen in urgent care and they managed to reoppose a lot of the skin tear although the patient states that is now reopened slightly. They have been using Neosporin. She has very fragile skin probably a combination of chronic steroid use for rheumatoid [name], [name] ([id number]) arthritis [although I do not see that she is on that now] solar skin damage. I note that she takes methotrexate and Plaquenil for her rheumatoid arthritis 9/15; this patient had a skin tear on her left forearm from trauma with her dog. She was seen in urgent care and they reapposed most of the separated skin. Fortunately for her most of this is remained intact and the skin above and below the wound actually looks healthy. Where there was a separation and the 2 flaps of skin there is still an open area however the granulation tissue looks healthy here. We have been using Xeroform however I had like to change to [facility] 9/29; skin tear on her left arm from a dog scratch. The skin was reapposed in an urgent care and surprisingly stayed viable. We used Hydrofera Blue last time the wound is smaller 10/13; patient comes back to clinic after 2 weeks for a left arm wound secondary to a dog scratch. The skin was reapposed and in urgent care. We have been using Hydrofera Blue and the wound is closed today. Readmissiono3/08/2022 Ms. [name] is an 88 year old female with a past medical history of chronic venous insufficiency and lymphedema, COPD, rheumatoid arthritis and hypertension that presents with a 2-week history of wounds to her lower extremities bilaterally. She states she had a skin tag to the right posterior leg that came off and became a wound. She states the area has slowly gotten larger. She was seen by her primary care physician's office on 02/15/2022 for this issue. She was prescribed Bactroban and cefdinir. She has 2 other wounds 1 located to each leg. These were from trauma. She states one was from trying  to put on compression stockings. Her ABIs in office on the left was 0.93 and 1.0 on the right. She currently denies systemic signs of infection. 02/24/2022 upon evaluation today patient appears to be doing well currently in regard to her skin tears. In general I feel like she is making  good progress. Fortunately I do not see any evidence of active infection locally or systemically which is great news. No fevers, chills, nausea, vomiting, or diarrhea. 03/04/2022 upon evaluation patient actually appears to be doing well in regard to the wounds on her legs. She has been tolerating the dressing changes without complication. Overall I think all are showing signs of improvement which is great news. 03/10/2022 upon evaluation today patient appears to be doing better in regard to her wounds in general. I am very pleased with where things stand. I do believe that she is showing signs of good improvement which is great news. No fevers, chills, nausea, vomiting, or diarrhea. 03-21-2022 upon evaluation today patient appears to be doing well with regard to her wound on the left leg and the anterior right leg both are showing signs of significant improvement. Unfortunately she has a lot of slough and biofilm buildup on the surface of the posterior wound which is not good. I do not see any signs of active infection locally or systemically which is great news. No fevers, chills, nausea, vomiting, or diarrhea. 03-29-2022 upon evaluation today patient appears to be doing well currently in regard to her wounds. She has been tolerating the dressing changes without complication. Fortunately there does not appear to be any signs of active infection at this time which is great news. No fevers, chills, nausea, vomiting, or diarrhea. 4/27; right posterior calf and left lateral calf both initially traumatic in the setting of significant venous insufficiency. She did not tolerate compression wraps apparently because she could not  get shoes on her feet and therefore we have been using Tubigrip's and silver alginate 5/8; right posterior calf 100% covered with adherent debris. The area on the left lateral really looks quite good under illumination islands of new epithelialization. We have been using Xeroform on the left Iodoflex on the right bilateral 2 layer Tubigrip's 04-28-2022 upon evaluation today patient appears to be doing well with regard to her wound she has been tolerating the dressing changes which is great news. Overall I feel like the Xeroform is doing well on the left leg this is pretty much healed on the right leg she is very close to being healed as well I think we can probably switch over to Xeroform here as well. 05-05-2022 upon evaluation today patient appears to be doing excellent in regard to her wounds both are showing signs of being very close to complete resolution which is awesome news. Fortunately I do not see any signs of active infection at this time which is great news and in general I think we are headed in the right direction. 05-13-2022 upon evaluation today patient appears to be doing well with regard to her wounds. She however does have some breakdown due to the fact that I think is staying too moist on the left lateral aspect. The right is also staying a little bit too moist in my opinion. Subsequently I think that we may want to switch the dressings currently. I would recommend switching over to a silver cell dressing. 6/13; apparently the patient was felt to be having home health by [facility] therefore she did not receive supplies for her wound care. Current dressing remained on for for 5 days. Removed today by her intake nurse with purulent drainage and the left lateral lower leg is considerably larger also slightly larger on the right. They are changing this usually every second day. At this point I am not really sure why the patient flagged  as having home health 05-31-2022 upon evaluation today  patient appears to be doing well with regard to her wounds in general. Fortunately I do not see any signs of active infection at this time systemically though locally she did have an infection last week and she is doing some better but was only able to take 3 days total of her medication. I will go ahead and get her sent in a prescription for doxycycline because the Bactrim was causing her stomach upset and I think probably the treatment matter is that she will benefit from at least 7 to 10 days of this doxycycline to make sure the infection is completely gone. Electronic Signature(s) Signed: 05/31/2022 10:49:34 AM By: Worthy Keeler PA-C Entered By: Worthy Keeler on 05/31/2022 10:49:34 [name] ([id number]) -------------------------------------------------------------------------------- Physical Exam Details Patient Name: [name] Date of Service: 05/31/2022 10:15 AM Medical Record Number: [id number] Patient Account Number: [id number] Date of Birth/Sex: 1930/05/31 (88 y.o. F) Treating RN: Carlene Coria Primary Care Provider: Fulton Reek Other Clinician: Referring Provider: Fulton Reek Treating Provider/Extender: Skipper Cliche in Treatment: 87 Constitutional Well-nourished and well-hydrated in no acute distress. Respiratory normal breathing without difficulty. Psychiatric this patient is able to make decisions and demonstrates good insight into disease process. Alert and Oriented x 3. pleasant and cooperative. Notes Upon inspection patient's wound bed actually showed signs of good granulation and epithelization at this point. Fortunately there does not appear to be any evidence of infection locally or systemically which is great news and overall I am very pleased with where things stand currently. Electronic Signature(s) Signed: 05/31/2022 10:49:48 AM By: Worthy Keeler PA-C Entered By: Worthy Keeler on 05/31/2022 10:49:47 [name]  ([id number]) -------------------------------------------------------------------------------- Physician Orders Details Patient Name: [name] Date of Service: 05/31/2022 10:15 AM Medical Record Number: [id number] Patient Account Number: [id number] Date of Birth/Sex: 09/11/1930 (88 y.o. F) Treating RN: Carlene Coria Primary Care Provider: Fulton Reek Other Clinician: Referring Provider: Fulton Reek Treating Provider/Extender: Skipper Cliche in Treatment: 14 Verbal / Phone Orders: No Diagnosis Coding ICD-10 Coding Code Description [phone] Laceration without foreign body, left lower leg, initial encounter S81.811A Laceration without foreign body, right lower leg, initial encounter L97.812 Non-pressure chronic ulcer of other part of right lower leg with fat layer exposed L97.822 Non-pressure chronic ulcer of other part of left lower leg with fat layer exposed I87.2 Venous insufficiency (chronic) (peripheral) Follow-up Appointments o Return Appointment in 1 week. Bathing/ Shower/ Hygiene o May shower; gently cleanse wound with antibacterial soap, rinse and pat dry prior to dressing wounds o No tub bath. Anesthetic (Use 'Patient Medications' Section for Anesthetic Order Entry) o Lidocaine applied to wound bed Edema Control - Lymphedema / Segmental Compressive Device / Other o Tubigrip double layer applied - size c o Elevate, Exercise Daily and Avoid Standing for Long Periods of Time. o Elevate legs to the level of the heart and pump ankles as often as possible o Elevate leg(s) parallel to the floor when sitting. Off-Loading o Turn and reposition every 2 hours - recommend prop legs/heels so wounds do not touch the bed/chair Additional Orders / Instructions o Follow Nutritious Diet and Increase Protein Intake Wound Treatment Wound #13 - Lower Leg Wound Laterality: Left Cleanser: Byram Ancillary Kit - 15 Day Supply (Generic) Every Other Day/30  Days Discharge Instructions: Use supplies as instructed; Kit contains: (15) Saline Bullets; (15) 3x3 Gauze; 15 pr Gloves Cleanser: Soap and Water Every Other Day/30 Days Discharge Instructions: Gently  cleanse wound with antibacterial soap, rinse and pat dry prior to dressing wounds Primary Dressing: Silvercel Small 2x2 (in/in) (Generic) Every Other Day/30 Days Discharge Instructions: Apply Silvercel Small 2x2 (in/in) as instructed Secondary Dressing: ABD Pad 5x9 (in/in) (Generic) Every Other Day/30 Days Discharge Instructions: Cover with ABD pad Secured With: Medipore Tape - 58M Medipore H Soft Cloth Surgical Tape, 2x2 (in/yd) (Generic) Every Other Day/30 Days Secured With: Kerlix Roll Sterile or Non-Sterile 6-ply 4.5x4 (yd/yd) Every Other Day/30 Days Discharge Instructions: Apply Kerlix as directed Wound #15 - Lower Leg Wound Laterality: Right Cleanser: Byram Ancillary Kit - 15 Day Supply (Generic) Every Other Day/30 Days Discharge Instructions: Use supplies as instructed; Kit contains: (15) Saline Bullets; (15) 3x3 Gauze; 15 pr Gloves [name], [name] ([id number]) Cleanser: Soap and Water Every Other Day/30 Days Discharge Instructions: Gently cleanse wound with antibacterial soap, rinse and pat dry prior to dressing wounds Primary Dressing: Silvercel Small 2x2 (in/in) (Generic) Every Other Day/30 Days Discharge Instructions: Apply Silvercel Small 2x2 (in/in) as instructed Secondary Dressing: ABD Pad 5x9 (in/in) (Generic) Every Other Day/30 Days Discharge Instructions: Cover with ABD pad Secured With: Medipore Tape - 58M Medipore H Soft Cloth Surgical Tape, 2x2 (in/yd) (Generic) Every Other Day/30 Days Secured With: Kerlix Roll Sterile or Non-Sterile 6-ply 4.5x4 (yd/yd) Every Other Day/30 Days Discharge Instructions: Apply Kerlix as directed Electronic Signature(s) Unsigned Entered ByCarlene Coria on 05/31/2022 10:38:11 Signature(s): Date(s): [name], [name]  ([id number]) -------------------------------------------------------------------------------- Problem List Details Patient Name: [name], [name] Date of Service: 05/31/2022 10:15 AM Medical Record Number: [id number] Patient Account Number: [id number] Date of Birth/Sex: Apr 01, 1930 (88 y.o. F) Treating RN: Carlene Coria Primary Care Provider: Fulton Reek Other Clinician: Referring Provider: Fulton Reek Treating Provider/Extender: Skipper Cliche in Treatment: 14 Active Problems ICD-10 Encounter Code Description Active Date MDM Diagnosis S81.812A Laceration without foreign body, left lower leg, initial encounter 02/17/2022 No Yes S81.811A Laceration without foreign body, right lower leg, initial encounter 02/17/2022 No Yes L97.812 Non-pressure chronic ulcer of other part of right lower leg with fat layer 02/17/2022 No Yes exposed L97.822 Non-pressure chronic ulcer of other part of left lower leg with fat layer 02/17/2022 No Yes exposed I87.2 Venous insufficiency (chronic) (peripheral) 02/17/2022 No Yes Inactive Problems Resolved Problems Electronic Signature(s) Signed: 05/31/2022 10:18:30 AM By: Worthy Keeler PA-C Entered By: Worthy Keeler on 05/31/2022 10:18:30 [name] ([id number]) -------------------------------------------------------------------------------- SuperBill Details Patient Name: [name] Date of Service: 05/31/2022 Medical Record Number: [id number] Patient Account Number: [id number] Date of Birth/Sex: 05-17-1930 (88 y.o. F) Treating RN: Carlene Coria Primary Care Provider: Fulton Reek Other Clinician: Referring Provider: Fulton Reek Treating Provider/Extender: Skipper Cliche in Treatment: 14 Diagnosis Coding ICD-10 Codes Code Description [phone] Laceration without foreign body, left lower leg, initial encounter S81.811A Laceration without foreign body, right lower leg, initial encounter L97.812 Non-pressure chronic ulcer of other part  of right lower leg with fat layer exposed L97.822 Non-pressure chronic ulcer of other part of left lower leg with fat layer exposed I87.2 Venous insufficiency (chronic) (peripheral) Facility Procedures CPT4 Code: [id number] Description: 99213 - WOUND CARE VISIT-LEV 3 EST PT Modifier: Quantity: 1 Electronic Signature(s) Unsigned Entered ByCarlene Coria on 05/31/2022 10:39:24 Signature(s): Date(s):

## 2022-06-07 ENCOUNTER — Encounter: Payer: Medicare Other | Admitting: Physician Assistant

## 2022-06-07 DIAGNOSIS — S81811A Laceration without foreign body, right lower leg, initial encounter: Secondary | ICD-10-CM | POA: Diagnosis not present

## 2022-06-07 NOTE — Progress Notes (Addendum)
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 as much since she was dealing with this issue due to the fact that she notes that the wound seems to leak and bleed more when she has the compression hose on. 11/22/17; patient was readmitted to clinic last week with a traumatic wound on her right anterior leg. This is a reasonably small wound but covered in an adherent necrotic debris. She is been using Santyl. 11/29/17 minimal improvement in wound dimensions to this initially traumatic  wound on her right anterior leg. Reasonably small wound but still adherent thick necrotic debris. We have been using Santyl 12/06/17 traumatic wound on the right anterior leg. Small wound but again adherent necrotic debris on the surface 95%. We have been using Santyl 12/13/86; small lright anterior traumatic leg wound. Using Santyl that again with adherent debris perhaps down to 50%. I changed her to Iodoflex today 12/20/17; right anterior leg traumatic wound. She again presents with debris about 50% of the wound. I changed her to Iodoflex last week but so far not a lot in the way of response 12/27/17; right anterior leg traumatic wound. She again presents with debris on the wound although it looks better. She is using Iodoflex entering her third week now. Still requiring debridement 01/16/18 on evaluation today patient seems to be doing fairly well in regard to her right lower extremity ulcer. She has been tolerating the dressing changes without complication. With that being said she does note that she's been having a lot of burning with the current dressing which is specifically the Iodoflex. Obviously this is a known side effect of the iodine in the dressing and I believe that may be giving her trouble. No fevers, chills, nausea, or vomiting noted at this time. Otherwise the wound does appear to be doing well. 01/30/18 on evaluation today patient appears to be doing well in regard to her right anterior lower extremity ulcer. She notes that this does seem to be smaller and she wonders why we did not start the Prisma dressing sooner since it has made such a big difference in such a short amount of time. I explained that obviously we have to wait for the wound to get to a certain point along his healing path before we can initiate the Prisma otherwise it will not be effective. Therefore once the wound became clean it was then time to initiate the Prisma. Nonetheless good news is she is noting excellent  improvement  she does still have some discomfort but nothing as significant as previously noted. 04/17/18 on evaluation today patient appears to be doing very well and in fact her right lower extremity ulcer has completely healed at this point I'm pleased with this. The left lower extremity ulcer seem to be doing better although she still does have some openings noted the Prisma I think is helping more than the Xeroform was in my pinion. With that being said she still has a lot of healing to do in this regard. 04/27/18 on evaluation today patient appears to be doing very well in regard to her left lower Trinity ulcers. She has been tolerating the dressing changes without complication. I do have a note from her orthopedic surgeon today and they would like for me to help with treating her left elbow surgery site where she had the bursa removed and this was performed roughly 4 weeks ago according to the note that I reviewed. She has been placed on Bactrim DS by need for her leg wounds this probably helped a little bit with the left elbow surgery site. Obviously I do think this is something we can try to help her out with. 05/04/18 on evaluation today patient appears to be doing well in regard to her left anterior lower Trinity ulcers. She is making good progress which is great news. Unfortunately her elbow which we are also managing at this point in time has not made as much progress unfortunately. She has been tolerating the dressing changes without complication. She did see Dr. Benson Setting earlier today and he states that he's willing to give this three weeks to see if she's making any progress with wound care. However he states that she's really not then he will need to go back in and perform VAIDEHI, BRADDY Barnett. ([id number]) further surgery. Obviously she is trying to avoid surgery if at all possible although I'm not sure if this is going to be possible or least not that quickly. 05/11/18 on evaluation today patient  appears to be doing very well in regard to her left lower extremity ulcers. Unfortunately in regard to her elbow this is very slow coming about as far as any improvement is concerned. I do feel like there may be a little bit more granulation noted in the base of the wound but nothing too significant unfortunately. I still can probe bone in the proximal portion of the wound which obviously explain to the patient is not good. She will be having a follow-up with her orthopedic surgeon in the next couple of weeks. In the meantime we are trying to do as much as we can to try to show signs of improvement in healing to avoid the need for any additional and further surgery. Nonetheless I explained to the patient yet again today I'm not sure if that is going to be feasible or not obviously it's more risk for her to continue to have an open wound with bone exposure then to the back in for additional surgery even though I know she doesn't want to go that route. 05/15/18 on evaluation today patient presents for follow-up concerning her ongoing lower extremity ulcers on the left as well as the left elbow ulcer. She has at this point in time been tolerating the dressing changes without complication. Her left lower extremity ulcer appears to be doing very well. In regard to the left elbow ulcer she actually does seem to have additional granulation today which is good news. I am definitely seeing  signs of improvement although obviously this is somewhat slow improvement. Nonetheless I'm hopeful we will be able to avoid her having to have any further surgery but again that would definitely be a conversation between herself as well as her surgeon once he sees her for reevaluation. Otherwise she does want to see about having a three order compression stockings for her today 05/21/18 on evaluation today patient appears to be doing well in regard to her left lower surety ulcer. This is almost completely healed and seems to be  progressing very nicely. With that being said her left elbow is another story. I'm not really convinced in the past three weeks we've seen a significant improvement in this wound. With that being said if this is something that there is no surgical option for him we have to continue to work on this from the standpoint of conservative management with wound care she may make improvement given time. Nonetheless it appears that her surgeon is somewhat concerned about the possibility of infection and really is leaning towards additional surgery to try and help close this wound. Nonetheless the patient is still unsure of exactly what to do. 05/29/18 on evaluation today patient appears to be doing well in regard to her left lower extremity ulcer. She's been tolerating the dressing changes without complication which is good news. With that being said she's been having issues specifically with her elbow she did see her surgeon Dr. Roland Rack and he is recommending a repeat surgery to the left elbow in order to correct the issue. The patient is still somewhat unsure of this but feels like this may be better than trying to take time to let this heal over a longer period of time through normal wound care measures. Again I explained that I agree this may be a faster way to go if her surgeon feels that this is indeed a good direction to take. Obviously only he can make the judgment on whether or not the surgery would likely be successful. 06/04/18 on evaluation today patient actually presents for follow-up concerning her left elbow and left lower from the ulcer she seems to be doing very well at this point in time. She has been tolerating the dressing changes without complication. With that being said her elbow is not significantly better she actually is scheduled for surgery tomorrow. 07/04/18; the patient had an area on her left leg that is remaining closed. The open area she has now is a postsurgical wound on the left  elbow. I think we have clearance from the surgeon to see this now. We're using Prisma 07/11/18; we're currently dealing with a surgical wound on the left olecranon process. The patient complains of a lot of pain and drainage. When I saw her last week we did an x-ray that showed soft tissue wound and probable elbow joint effusion but no erosion to suggest osteomyelitis. The culture I did of this was somewhat surprisingly negative. She has a small open wound with not a viable surface there is considerable undermining relative to the wound size. She is on methotrexate for rheumatoid arthritis/overlap syndrome also plaquenil. We've been using silver collagen 07/18/18-She is seen in follow-up evaluation for a left elbow wound. There is essentially no change. She is currently on Zithromax and will complete that on Friday, there is no indication to extend this. We will change to iodosorb/iodoflex and monitor for response 07/25/18-She is seen in follow-up evaluation for left elbow wound. The wound is stable with no overt evidence  of infection. She has counseled with her rheumatologist. She is wanting to restart her methotrexate; a culture was obtained to rule out occult infection before starting her methotrexate. We will continue with Iodosorb/Iodoflex and she will follow-up next week. 08/01/18; this is a difficult wound over her left olecranon process. There is been concerned about infection although cultures including one done last week were negative. Pending 3 weeks ago I gave her an empiric course of antibiotics. She is having a lot of rheumatologic pain in her hands with pain and stiffness. She wants to go on her weekly methotrexate and I think it would be reasonable to do so. We have been using Iodoflex 08/01/18; difficult wound over her left olecranon process. She started back on methotrexate last week because of rheumatologic pain in her hands. We have been using Iodoflex to try and clean out the wound bed.  She has been approved for Graphix PL 08/15/18; 2 week follow-up. Difficult wound over her left olecranon process. Graphix PL #1 with collagen backing 08/22/18; one-week follow-up. Difficult wound over her left olecranon process. Graphix PL #2 08/29/18; no major improvement. Difficult wound over her left olecranon process. Still considerable undermining. Graphics PL #3 1 week follow-up. Graphix #4 09/12/18 graphics #5. Some improvement in wound area although the undermining superiorly still has not closed down as much as I would like 09/19/18; Graphix #6 I think there is improvement in the undermining from 7 to 9:00. Wound bed looks healthy. 09/26/18 Graffix #7 undermining is 0.5 cm maximally at roughly 8:00. From 12 to 7:00 the tissue is adherent which is a major improvement there is some advancing skin from this side. 10/03/18; Graphix #8 no major changes from last week 10/10/18 Graffix #9 There are improvements. There appears to be granulation coming up to the surface here and there is a lot less undermining at 8:00. 10/17/18. Graffix #10; Dimensions are improved less undermining surface felt the but the wound is still open. Initially a surgical wound following a bursectomy 10/24/18; Graffix #11. This is really stalled over the last 2 weeks. If there is no further improvement this will be the last application.The final option for this difficult area would be plastic surgery and will set up a consult with Dr. Lorayne Bender in [facility] 10/31/18; wound looks about the same. The undermining superiorly is 0.7 cm. On the lateral edges perhaps some improvement there is no drainage. 11-07-2018 patient seen today for follow-up and management of left elbow wound. She has completed a total of 11 treatments of the graffix with not much improvement. She has an upcoming appointment with plastic surgery to assist with additional treatment options for the left elbow wound on 11/19/18. There is significant amount  surrounding undermining of the wound is 0.9 cm. Currently prescribed methotrexate. Wound is being treated with Indoform and border dressing. No drainage from wound. No fever, chills. or pain. 11/21/18; the patient continues to have the wound looking roughly the same with undermining from about 12 to 6:00. This has not changed all that much. She does have skin irritation around the wound that looks like drainage maceration issues. The patient states that she was not able to have her wound dressing changed because of illness in the person he usually does this. She also did not attend her clinic appointment today with Dr. Lorayne Bender because of transportation issues. She is rebooked for some time in mid January 11/28/2018; the patient has less undermining using endoform. As a understandings she saw Dr. Doy Mince who is Dr.  [place] partner. He recommended putting her in a elbow brace and I believe is written a prescription for it. He also recommended Motrin 800 mg 3 times daily. This is prescription strength ibuprofen although he did not write his prescription. This apparently was for 2 weeks. Culture I did last time grew a few [name], [name]. ([id number]) methicillin sensitive staph aureus. After some difficulty due to drug intolerances/allergies and drug interactions I settled on a 5-day course of azithromycin 12/03/18 on evaluation today patient actually appears to be doing fairly well in regard to her elbow when compared to last time I evaluated her. With that being said there does not appear to be any signs of infection at this time. That was the big concern currently as far as the patient was concerned. Nonetheless I do feel like she is making progress in regard to the feeling of this ulcer it has been slow. She did see a Psychiatric nurse they are talking about putting her in a brace in order to allow this area to heal more appropriately. 12/19/2018; not much change in this from the last time I have  saw this.'s much smaller area than when she first came in and with less circumferential undermining however this is never really adhered. She is wearing the brace that was given or prescribed to her by plastics. She did not have a procedure offered to attempt to close this. We have been using endoform 1/15; wound actually is not doing as well as last week. She was actually not supposed to come into this clinic again until next week but apparently her attendant noticed some redness increasing pain and she came in early. She reports the same amount of drainage. We have been using endoform. She is approved through puraply however I will only consider starting that next week 1/22; she completed the antibiotics last week. Culture I did was negative. In spite of this there is less erythema and pain complaints in the wound. Puraply #1 applied today 1/29; Puraply #2 today. Wound surface looks a lot better post debridement of adherent fibrinous material. However undermining from 6-12 is measuring worse 2/5; Puraply #3. Using her elbow brace 2/12 puraply #4 2/19 puraply #5. The 9:00 undermining measured at 0.5 cm. Undermining from 4-11 o'clock. Surface of the wound looks better and the circumference of the wound is smaller however the undermining is not really changed 2/26; still not much improvement. She has undermining from 4-9 o'clock 0.9 cm. Surface of the wound covered and adherent debris. 3/4; still no improvement. Undermining from 4-9 o'clock still around a centimeter. Surface of the wound looks somewhat better. No debridement is required we used endoform after we ended the trial of puraply last week 3/11; really no improvement at all. Still 1 cm undermining from roughly 9-3 o'clock. This is about a centimeter. The base of the wound looks fairly healthy. No debridement. We have been using endoform. I am really out of most usual options here. I could consider either another round of an amniotic  advanced treatment product example epifix or perhaps regranex. Understandably the patient is a bit frustrated. We did send her to plastic surgery for a consult. Other than prescribing her a brace to immobilize the elbow they did not think she was a candidate for any further surgery. Notable that the patient is not using the brace today 3/18-Patient returns for attention to the left elbow area which apparently looked red at the home health visit. Patient's elbow looks the same if  not better compared with last visit. The area of ulceration remains the same, the base appears healthy. We are continuing to use endoform she has been encouraged to use the brace to keep the elbow straight 3/25; the patient has an appointment at the [facility] wound care center on 4/2. I had actually put her out indefinitely however she seems to want to come back here every week. This week she complains of increased pain and malodor. Dimensions of the elbow wound are larger. We have been using endoform 4/8; the patient went to [facility] where they apparently gave her meta honey and some border foam with a Tubigrip. She has been using this for a week. She says she was very impressed with them there. They did not offer her any surgical consultation. She seems to be coming back here for a second opinion on this, she does not wish to drive to [facility] every week 4/22; still using Medihoney foam border and a Tubigrip. Actually do not think she has anything on the arm at all specifically she is not using her brace 5/6; she has been using medihoney without a lot of improvement. Undermining maximum at 9:00 at 0.5 cm may be somewhat better. She is still complaining of discomfort. She uses her elbow brace at night but is not using anything on the arm during the day, she finds it too restrictive 5/13; we switch the patient to endoform AG last week. She is complaining of more pain and worried about some circumferential erythema around the wound. I had  planned to consider epifix in this wound however the patient came in with a request to see a plastic surgeon in [place] by the name of Dr. Harl Bowie who cared for a friend of hers. Noteworthy that I have already sent her to one plastic surgeon and she went for another second opinion at [facility] and they apparently did not send her to a plastic surgeon nevertheless the patient is fairly convinced that she might benefit from a skin graft which I am doubtful. She also has rheumatoid arthritis and is on methotrexate. This was originally a surgical wound for a bursectomy a year or 2 ago Readmission: 06/28/19 on evaluation today patient presents today for reevaluation but this is due to a new issue her elbow has completely healed and looks excellent. Her right anterior lower leg has a skin tear which was sustained from her dog who jumped up on her and inadvertently scratch the area causing the skin tear. This happened yesterday. She is having some discomfort but fortunately nothing too significant which is good news. No fevers, chills, nausea, or vomiting noted at this time. The skin fortunately was knocked one completely off and we are gonna see about we approximate in the skin as best we can in using Steri-Strips to hold this in place obviously if we can get some of this to reattach that would be beneficial 07/05/2019 on evaluation today patient actually appears to be doing okay although unfortunately the skin flap that we were attempting to [place] down last week did not take. She is developing a lot of fluid underneath the wound area unfortunately which again is not ideal. I think that this necrotic tissue needs to be removed and again it actually just wiped off during the evaluation today as I was attempting to clean the wound there did not appear to be any significant issues underlying which is good although there was some purulent drainage I did want to go ahead and see  about obtaining a culture  from today in order to ensure that the Z-Pak that I placed her on earlier in the week was appropriate for treating what ever infection may be causing the issue currently. This was a deep wound culture obtained today. 07/12/2019 on evaluation today patient actually appears to be doing quite well with regard to her right lower extremity ulcer all things considering. There is still little bit of hematoma not it that is noted in the central portion of the wound along with some necrotic tissue but she is still having quite a bit of discomfort. For that reason I did not perform sharp debridement today although this wound does need some debridement of one type or another. I think we may attempt Iodoflex to see if this can be of benefit. Fortunately there is no signs of active infection at this point. 07/18/19 upon evaluation today patient appears to be doing better with regard to her ulcer on her right lower extremity. She's been tolerating the dressing changes without complication. The good news is she seems to be making excellent progress. Overall I'm pleased with there being no signs of infection. With that being said she does tell me that she's been having some discomfort with the wrap she's unsure of exactly what about the rafters causing her trouble. 07/25/2019 on evaluation today patient appears to be doing a little better in regard to her lower extremity ulcer. Unfortunately the alginate seems to be getting really stuck to the wound bed and surrounding periwound. Fortunately there is no signs of active infection at this time. There does not appear to be any evidence of infection currently. 08/01/2019 on evaluation today patient actually appears to be doing much better with regard to her right lower extremity ulcer. She has been [place], [place] Barnett. ([id number]) tolerating the dressing changes without complication. Fortunately her wound does not show any signs of infection and seems to be making good progress.  No fevers, chills, nausea, vomiting, or diarrhea. 08/08/2019 on evaluation today patient actually appears to be doing better with regard to her leg ulcer. She has been tolerating the dressing changes without complication. With that being said I think we may want to switch the dressing up today just based on the appearance of the wound bed in general. Fortunately there is no evidence of infection currently. Her pain seems to be much better. Her son is present with her today. 08/15/2019 on evaluation today patient appears to be doing very well with regard to her right lower extremity ulcer. She still complains about feeling like the compression stocking/Tubigrip is too tight for her. Nonetheless I still think this is beneficial and is helping the wound to heal more effectively. Overall I am extremely pleased at this time with what I am seeing. 08/22/2019 on evaluation today patient actually appears to be doing quite well with regard to her right lower extremity. She has been tolerating the dressing changes without complication. Fortunately there is no signs of active infection at this time. She still complains about the Tubigrip but nonetheless I think this is something that is of utmost importance for her to continue to wear if she is going to see this area heal appropriately. 08/29/2019 on evaluation today patient actually appears to be doing well visually in regard to her wound. Unfortunately in regard to overall pain she seems to be having more pain today which I am somewhat concerned about. There does not appear to be any signs of active infection that I can  tell but again with increased pain that is definitely a concern here today. I do think it may be time to switch up the dressing as well to something that will be a little bit more effective hopefully and new tissue growth she has done well with collagen in the past. 09/05/2019 on evaluation today patient actually appears to be doing well with regard  to her leg ulcer. She has been tolerating the dressing changes without complication. Fortunately there is no signs of active infection. We did obtain a wound culture last week due to the fact that she still is very touchy as far as the surface of the wound is concerned. Unfortunately I am not sure what happened to that culture as there is no record in the computer system. Nonetheless we need to follow-up on this and try to see if we can identify what exactly happened with this specimen as we have not gotten a result back either obviously. 09/12/2019 on evaluation today patient actually appears to be showing signs of good improvement upon evaluation today. She states her pain is not nearly as bad as what it was which is also good news. She did have a wound culture which was reviewed today that showed no growth of bacteria which is good news. This coupled with the fact that her wound appears to be healing better, measuring smaller, and overall even the slough buildup is minimal compared to what we have seen previously, I feel like she is actually showing signs of excellent improvement today. 09/19/2019 on evaluation today patient appears to be doing well with regard to her wounds on evaluation today. She is showing signs of continued improvement week by week and everything is measuring significantly smaller which is great news. Overall very pleased with the progress that she has made. 09/26/2019 on evaluation today patient appears to be doing fairly well with regard to her right lower extremity ulcer. She does continue to have some drainage and again the smaller of the 2 wounds is not making as much progress as I like to see. We have been doing the silver collagen for some time I think we may want to switch to [facility] she has a little bit of hyper granular tissue this may help to keep things from becoming too dramatic in that regard. Fortunately there is no signs of infection at this  time. 10/03/2019 on evaluation today patient appears to be doing fairly well with regard to her lower extremity ulcers at this time. She has been tolerating the dressing changes without complication. Fortunately the wound is measuring smaller which is great news she continues to make great progress. 10/17/2019 on evaluation today patient appears to be doing well with regard to her wounds of her right lower extremity. She states she has been having some increased pain apparently we gave her a little bit smaller size Tubigrip last time she was here this may have been what was causing some issues. She is out of the Tubigrip that we had ordered for her and therefore did not have any of the smaller size. Fortunately there is no evidence of active infection at this time. No fever chills noted 10/24/2019 upon evaluation today patient's wounds actually appear to be showing signs of good improvement which is excellent news. She has been tolerating the dressing changes without complication and overall I am rather pleased at this point with how things seem to be progressing. The patient likewise is very happy. She still was not happy with having  to use the Tubigrip she states that bunches up in her shoe. 10/31/2019 on evaluation today patient actually appears to be doing quite well with regard to her leg ulcer. She has been tolerating the dressing changes without complication. Fortunately there is no signs of active infection at this time. Overall I am very pleased with the progress that she is made. She does wonder if we could switch to the bordered adhesive Hydrofera Blue I think that something we can definitely look into for her. 11/15/2019 patient on evaluation today appears to be making excellent progress. Her wound is significantly smaller and the wound bed appears to be dramatically improved compared to even the last visit. Fortunately she is having really little to no pain at this time. No fevers, chills,  nausea, vomiting, or diarrhea. 11/25/2019 on evaluation today patient appears to be doing well with regard to her lower extremity ulcer. She has been tolerating dressing changes without complication. Fortunately there is no signs of active infection at this time. No fevers, chills, nausea, vomiting, or diarrhea. 12/02/2019 on evaluation today patient unfortunately is not doing quite as well as what we would have liked to have seen based on last week's evaluation. Fortunately there is no signs of active infection at this time. With that being said she is continuing to experience some issues with hyper granulation she was doing much better with the [facility] prior to putting the contact layer in between her skin and the [facility] I think that we need to go back to that. She just can have to be very careful with removing the dressing in order to ensure it does not pull skin and get stuck. 12/16/2019 on evaluation today patient appears to be doing better her wound is measuring significantly smaller at this time compared to where things were previous. Fortunately there is no evidence of active infection at this point which is also good news. No fever chills noted. 12/23/2019 upon evaluation today patient appears to be doing excellent in regard to her lower extremity ulcer. She has been tolerating the dressing changes without complication. Fortunately there is no signs of active infection at this time. Overall I am very pleased with the fact that she is doing so well today. READMISSION 08/12/2020 [name] ([id number]) [name] is a now 88 year old woman we have had in this clinic on at least 2 occasions. She spent a protracted period of time here for a postsurgical wound on her left olecranon and then more recently towards the end of last year earlier this year was in for a skin tear on her left lower leg caused by her dog. She comes in today with a reasonably acute area on her left  forearm again apparently caused by her dog although it was not a bite injury. She was seen in urgent care and they managed to reoppose a lot of the skin tear although the patient states that is now reopened slightly. They have been using Neosporin. She has very fragile skin probably a combination of chronic steroid use for rheumatoid arthritis [although I do not see that she is on that now] solar skin damage. I note that she takes methotrexate and Plaquenil for her rheumatoid arthritis 9/15; this patient had a skin tear on her left forearm from trauma with her dog. She was seen in urgent care and they reapposed most of the separated skin. Fortunately for her most of this is remained intact and the skin above and below the wound actually  looks healthy. Where there was a separation and the 2 flaps of skin there is still an open area however the granulation tissue looks healthy here. We have been using Xeroform however I had like to change to [facility] 9/29; skin tear on her left arm from a dog scratch. The skin was reapposed in an urgent care and surprisingly stayed viable. We used Hydrofera Blue last time the wound is smaller 10/13; patient comes back to clinic after 2 weeks for a left arm wound secondary to a dog scratch. The skin was reapposed and in urgent care. We have been using Hydrofera Blue and the wound is closed today. Readmission 02/17/2022 Ms. Jazman Reuter is an 88 year old female with a past medical history of chronic venous insufficiency and lymphedema, COPD, rheumatoid arthritis and hypertension that presents with a 2-week history of wounds to her lower extremities bilaterally. She states she had a skin tag to the right posterior leg that came off and became a wound. She states the area has slowly gotten larger. She was seen by her primary care physician's office on 02/15/2022 for this issue. She was prescribed Bactroban and cefdinir. She has 2 other wounds 1 located to each leg.  These were from trauma. She states one was from trying to put on compression stockings. Her ABIs in office on the left was 0.93 and 1.0 on the right. She currently denies systemic signs of infection. 02/24/2022 upon evaluation today patient appears to be doing well currently in regard to her skin tears. In general I feel like she is making good progress. Fortunately I do not see any evidence of active infection locally or systemically which is great news. No fevers, chills, nausea, vomiting, or diarrhea. 03/04/2022 upon evaluation patient actually appears to be doing well in regard to the wounds on her legs. She has been tolerating the dressing changes without complication. Overall I think all are showing signs of improvement which is great news. 03/10/2022 upon evaluation today patient appears to be doing better in regard to her wounds in general. I am very pleased with where things stand. I do believe that she is showing signs of good improvement which is great news. No fevers, chills, nausea, vomiting, or diarrhea. 03-21-2022 upon evaluation today patient appears to be doing well with regard to her wound on the left leg and the anterior right leg both are showing signs of significant improvement. Unfortunately she has a lot of slough and biofilm buildup on the surface of the posterior wound which is not good. I do not see any signs of active infection locally or systemically which is great news. No fevers, chills, nausea, vomiting, or diarrhea. 03-29-2022 upon evaluation today patient appears to be doing well currently in regard to her wounds. She has been tolerating the dressing changes without complication. Fortunately there does not appear to be any signs of active infection at this time which is great news. No fevers, chills, nausea, vomiting, or diarrhea. 4/27; right posterior calf and left lateral calf both initially traumatic in the setting of significant venous insufficiency. She did not  tolerate compression wraps apparently because she could not get shoes on her feet and therefore we have been using Tubigrip's and silver alginate 5/8; right posterior calf 100% covered with adherent debris. The area on the left lateral really looks quite good under illumination islands of new epithelialization. We have been using Xeroform on the left Iodoflex on the right bilateral 2 layer Tubigrip's 04-28-2022 upon evaluation today patient appears  to be doing well with regard to her wound she has been tolerating the dressing changes which is great news. Overall I feel like the Xeroform is doing well on the left leg this is pretty much healed on the right leg she is very close to being healed as well I think we can probably switch over to Xeroform here as well. 05-05-2022 upon evaluation today patient appears to be doing excellent in regard to her wounds both are showing signs of being very close to complete resolution which is awesome news. Fortunately I do not see any signs of active infection at this time which is great news and in general I think we are headed in the right direction. 05-13-2022 upon evaluation today patient appears to be doing well with regard to her wounds. She however does have some breakdown due to the fact that I think is staying too moist on the left lateral aspect. The right is also staying a little bit too moist in my opinion. Subsequently I think that we may want to switch the dressings currently. I would recommend switching over to a silver cell dressing. 6/13; apparently the patient was felt to be having home health by [facility] therefore she did not receive supplies for her wound care. Current dressing remained on for for 5 days. Removed today by her intake nurse with purulent drainage and the left lateral lower leg is considerably larger also slightly larger on the right. They are changing this usually every second day. At this point I am not really sure why the patient  flagged as having home health 05-31-2022 upon evaluation today patient appears to be doing well with regard to her wounds in general. Fortunately I do not see any signs of active infection at this time systemically though locally she did have an infection last week and she is doing some better but was only able to take 3 days total of her medication. I will go ahead and get her sent in a prescription for doxycycline because the Bactrim was causing her stomach upset and I think probably the treatment matter is that she will benefit from at least 7 to 10 days of this doxycycline to make sure the infection is completely gone. 06-07-2022 upon evaluation today patient appears to be doing better. Her right leg is completely healed which is great news. The left leg is improving though not completely healed I think we are on the right track here still and I am very pleased with where things stand at this point. There does not appear to be any sign of infection at all at this time. ANSLEIGH, SAFER ([id number]) Objective Constitutional Well-nourished and well-hydrated in no acute distress. Vitals Time Taken: 10:20 AM, Height: 60 in, Weight: 132 lbs, BMI: 25.8, Temperature: 98.1 F, Pulse: 71 bpm, Respiratory Rate: 18 breaths/min, Blood Pressure: 152/66 mmHg. Respiratory normal breathing without difficulty. Psychiatric this patient is able to make decisions and demonstrates good insight into disease process. Alert and Oriented x 3. pleasant and cooperative. General Notes: Upon inspection patient's wound bed on the right leg is completely closed on the left leg this is significantly improved though not completely closed at this point. Fortunately I do not see any evidence of active infection locally or systemically at this time which is excellent. Integumentary (Hair, Skin) Wound #13 status is Open. Original cause of wound was Trauma. The date acquired was: 02/09/2022. The wound has been in treatment 15  weeks. The wound is located on the Left  Lower Leg. The wound measures 4cm length x 5cm width x 0.1cm depth; 15.708cm^2 area and 1.571cm^3 volume. There is Fat Layer (Subcutaneous Tissue) exposed. There is no tunneling or undermining noted. There is a medium amount of serosanguineous drainage noted. There is large (67-100%) granulation within the wound bed. There is no necrotic tissue within the wound bed. Wound #15 status is Open. Original cause of wound was Trauma. The date acquired was: 02/09/2022. The wound has been in treatment 15 weeks. The wound is located on the Right Lower Leg. The wound measures 0cm length x 0cm width x 0cm depth; 0cm^2 area and 0cm^3 volume. There is no tunneling or undermining noted. There is a none present amount of drainage noted. There is no granulation within the wound bed. There is no necrotic tissue within the wound bed. Assessment Active Problems ICD-10 Laceration without foreign body, left lower leg, initial encounter Laceration without foreign body, right lower leg, initial encounter Non-pressure chronic ulcer of other part of right lower leg with fat layer exposed Non-pressure chronic ulcer of other part of left lower leg with fat layer exposed Venous insufficiency (chronic) (peripheral) Plan Follow-up Appointments: Return Appointment in 1 week. Bathing/ Shower/ Hygiene: May shower; gently cleanse wound with antibacterial soap, rinse and pat dry prior to dressing wounds No tub bath. Anesthetic (Use 'Patient Medications' Section for Anesthetic Order Entry): Lidocaine applied to wound bed Edema Control - Lymphedema / Segmental Compressive Device / Other: Tubigrip double layer applied - size c Elevate, Exercise Daily and Avoid Standing for Long Periods of Time. Elevate legs to the level of the heart and pump ankles as often as possible Elevate leg(s) parallel to the floor when sitting. Off-Loading: Turn and reposition every 2 hours - recommend prop  legs/heels so wounds do not touch the bed/chair Additional Orders / Instructions: Follow Nutritious Diet and Increase Protein Intake WOUND #13: - Lower Leg Wound Laterality: Left Cleanser: Byram Ancillary Kit - 15 Day Supply (Generic) Every Other Day/30 Days Discharge Instructions: Use supplies as instructed; Kit contains: (15) Saline Bullets; (15) 3x3 Gauze; 15 pr Gloves Cleanser: Soap and Water Every Other Day/30 Days Discharge Instructions: Gently cleanse wound with antibacterial soap, rinse and pat dry prior to dressing wounds SHIRLY, BARTOSIEWICZ ([id number]) Primary Dressing: Silvercel Small 2x2 (in/in) (Generic) Every Other Day/30 Days Discharge Instructions: Apply Silvercel Small 2x2 (in/in) as instructed Secondary Dressing: ABD Pad 5x9 (in/in) (Generic) Every Other Day/30 Days Discharge Instructions: Cover with ABD pad Secured With: Medipore Tape - 49M Medipore H Soft Cloth Surgical Tape, 2x2 (in/yd) (Generic) Every Other Day/30 Days Secured With: Kerlix Roll Sterile or Non-Sterile 6-ply 4.5x4 (yd/yd) Every Other Day/30 Days Discharge Instructions: Apply Kerlix as directed 1. I Minna recommend currently that we continue with the wound care measures as before and the patient is in agreement with plan. This includes the use of the silver alginate dressing which I do believe is doing quite well. 2. I am also can recommend that we have the patient continue to monitor for any signs of worsening or infection. If anything changes she should contact the office and let me know as soon as possible. We will see patient back for reevaluation in 1 week here in the clinic. If anything worsens or changes patient will contact our office for additional recommendations. Electronic Signature(s) Signed: 06/07/2022 1:27:13 PM By: Worthy Keeler PA-C Entered By: Worthy Keeler on 06/07/2022 13:27:13 [name]  ([id number]) -------------------------------------------------------------------------------- SuperBill Details Patient Name: [name] Date of  Service: 06/07/2022 Medical Record Number: [id number] Patient Account Number: [id number] Date of Birth/Sex: 12-Feb-1930 (88 y.o. F) Treating RN: [name] Primary Care Provider: Fulton Reek Other Clinician: Referring Provider: Fulton Reek Treating Provider/Extender: Skipper Cliche in Treatment: 15 Diagnosis Coding ICD-10 Codes Code Description [phone] Laceration without foreign body, left lower leg, initial encounter S81.811A Laceration without foreign body, right lower leg, initial encounter L97.812 Non-pressure chronic ulcer of other part of right lower leg with fat layer exposed L97.822 Non-pressure chronic ulcer of other part of left lower leg with fat layer exposed I87.2 Venous insufficiency (chronic) (peripheral) Facility Procedures CPT4 Code: [id number] Description: 99213 - WOUND CARE VISIT-LEV 3 EST PT Modifier: Quantity: 1 Physician Procedures CPT4 Code: [id number] Description: 35521 - WC PHYS LEVEL 3 - EST PT Modifier: Quantity: 1 CPT4 Code: Description: ICD-10 Diagnosis Description S81.812A Laceration without foreign body, left lower leg, initial encounter S81.811A Laceration without foreign body, right lower leg, initial encounter L97.812 Non-pressure chronic ulcer of other part of right  lower leg with fat la L97.822 Non-pressure chronic ulcer of other part of left lower leg with fat lay Modifier: yer exposed er exposed Quantity: Electronic Signature(s) Signed: 06/07/2022 1:27:27 PM By: Worthy Keeler PA-C Entered By: Worthy Keeler on 06/07/2022 13:27:27

## 2022-06-08 NOTE — Progress Notes (Signed)
AARTI, MANKOWSKI ([id number]) Visit Report for 06/07/2022 Arrival Information Details Patient Name: [name], [name]. Date of Service: 06/07/2022 10:15 AM Medical Record Number: [id number] Patient Account Number: [id number] Date of Birth/Sex: 1930/09/07 (88 y.o. F) Treating RN: Carlene Coria Primary Care Darragh Nay: Fulton Reek Other Clinician: Referring Ammanda Dobbins: Fulton Reek Treating Violanda Bobeck/Extender: Skipper Cliche in Treatment: 15 Visit Information History Since Last Visit All ordered tests and consults were completed: No Patient Arrived: Wheel Chair Added or deleted any medications: No Arrival Time: 10:14 Any new allergies or adverse reactions: No Accompanied By: caregiver Had a fall or experienced change in No Transfer Assistance: None activities of daily living that may affect Patient Identification Verified: Yes risk of falls: Patient Requires Transmission-Based Precautions: No Signs or symptoms of abuse/neglect since last visito No Patient Has Alerts: Yes Hospitalized since last visit: No Patient Alerts: NOT diabetic Implantable device outside of the clinic excluding No cellular tissue based products placed in the center since last visit: Has Dressing in Place as Prescribed: Yes Has Compression in Place as Prescribed: Yes Pain Present Now: No Electronic Signature(s) Signed: 06/08/2022 8:01:30 AM By: Carlene Coria RN Entered By: Carlene Coria on 06/07/2022 10:20:08 [name] ([id number]) -------------------------------------------------------------------------------- Clinic Level of Care Assessment Details Patient Name: [name] Date of Service: 06/07/2022 10:15 AM Medical Record Number: [id number] Patient Account Number: [id number] Date of Birth/Sex: Dec 29, 1929 (88 y.o. F) Treating RN: Carlene Coria Primary Care Emmory Solivan: Fulton Reek Other Clinician: Referring Sharmain Lastra: Fulton Reek Treating Jagdeep Ancheta/Extender: Skipper Cliche in Treatment:  15 Clinic Level of Care Assessment Items TOOL 4 Quantity Score X - Use when only an EandM is performed on FOLLOW-UP visit 1 0 ASSESSMENTS - Nursing Assessment / Reassessment X - Reassessment of Co-morbidities (includes updates in patient status) 1 10 X- 1 5 Reassessment of Adherence to Treatment Plan ASSESSMENTS - Wound and Skin Assessment / Reassessment [] - Simple Wound Assessment / Reassessment - one wound 0 X- 2 5 Complex Wound Assessment / Reassessment - multiple wounds [] - 0 Dermatologic / Skin Assessment (not related to wound area) ASSESSMENTS - Focused Assessment [] - Circumferential Edema Measurements - multi extremities 0 [] - 0 Nutritional Assessment / Counseling / Intervention [] - 0 Lower Extremity Assessment (monofilament, tuning fork, pulses) [] - 0 Peripheral Arterial Disease Assessment (using hand held doppler) ASSESSMENTS - Ostomy and/or Continence Assessment and Care [] - Incontinence Assessment and Management 0 [] - 0 Ostomy Care Assessment and Management (repouching, etc.) PROCESS - Coordination of Care X - Simple Patient / Family Education for ongoing care 1 15 [] - 0 Complex (extensive) Patient / Family Education for ongoing care [] - 0 Staff obtains Programmer, systems, Records, Test Results / Process Orders [] - 0 Staff telephones HHA, Nursing Homes / Clarify orders / etc [] - 0 Routine Transfer to another Facility (non-emergent condition) [] - 0 Routine Hospital Admission (non-emergent condition) [] - 0 New Admissions / Biomedical engineer / Ordering NPWT, Apligraf, etc. [] - 0 Emergency Hospital Admission (emergent condition) X- 1 10 Simple Discharge Coordination [] - 0 Complex (extensive) Discharge Coordination PROCESS - Special Needs [] - Pediatric / Minor Patient Management 0 [] - 0 Isolation Patient Management [] - 0 Hearing / Language / Visual special needs [] - 0 Assessment of Community assistance (transportation, D/C planning,  etc.) [] - 0 Additional assistance / Altered mentation [] - 0 Support Surface(s) Assessment (bed, cushion, seat, etc.) INTERVENTIONS - Wound Cleansing / Measurement IDALIS, HOELTING V. ([id number]) [] -  0 Simple Wound Cleansing - one wound X- 2 5 Complex Wound Cleansing - multiple wounds X- 1 5 Wound Imaging (photographs - any number of wounds) [] - 0 Wound Tracing (instead of photographs) [] - 0 Simple Wound Measurement - one wound X- 2 5 Complex Wound Measurement - multiple wounds INTERVENTIONS - Wound Dressings X - Small Wound Dressing one or multiple wounds 1 10 [] - 0 Medium Wound Dressing one or multiple wounds [] - 0 Large Wound Dressing one or multiple wounds X- 1 5 Application of Medications - topical [] - 0 Application of Medications - injection INTERVENTIONS - Miscellaneous [] - External ear exam 0 [] - 0 Specimen Collection (cultures, biopsies, blood, body fluids, etc.) [] - 0 Specimen(s) / Culture(s) sent or taken to Lab for analysis [] - 0 Patient Transfer (multiple staff / Civil Service fast streamer / Similar devices) [] - 0 Simple Staple / Suture removal (25 or less) [] - 0 Complex Staple / Suture removal (26 or more) [] - 0 Hypo / Hyperglycemic Management (close monitor of Blood Glucose) [] - 0 Ankle / Brachial Index (ABI) - do not check if billed separately X- 1 5 Vital Signs Has the patient been seen at the hospital within the last three years: Yes Total Score: 95 Level Of Care: New/Established - Level 3 Electronic Signature(s) Signed: 06/08/2022 8:01:30 AM By: Carlene Coria RN Entered By: Carlene Coria on 06/07/2022 10:46:50 [name] ([id number]) -------------------------------------------------------------------------------- Encounter Discharge Information Details Patient Name: [name] Date of Service: 06/07/2022 10:15 AM Medical Record Number: [id number] Patient Account Number: [id number] Date of Birth/Sex: 03-31-1930 (88 y.o. F) Treating RN:  Carlene Coria Primary Care : Fulton Reek Other Clinician: Referring : Fulton Reek Treating /Extender: Skipper Cliche in Treatment: 15 Encounter Discharge Information Items Discharge Condition: Stable Ambulatory Status: Wheelchair Discharge Destination: Home Transportation: Private Auto Accompanied By: self Schedule Follow-up Appointment: Yes Clinical Summary of Care: Electronic Signature(s) Signed: 06/08/2022 8:01:30 AM By: Carlene Coria RN Entered By: Carlene Coria on 06/07/2022 10:48:13 [name] ([id number]) -------------------------------------------------------------------------------- Lower Extremity Assessment Details Patient Name: [name] Date of Service: 06/07/2022 10:15 AM Medical Record Number: [id number] Patient Account Number: [id number] Date of Birth/Sex: 03-28-1930 (88 y.o. F) Treating RN: Carlene Coria Primary Care : Fulton Reek Other Clinician: Referring : Fulton Reek Treating /Extender: Skipper Cliche in Treatment: 15 Edema Assessment Assessed: [Left: No] [Right: No] Edema: [Left: Yes] [Right: Yes] Calf Left: Right: Point of Measurement: 32 cm From Medial Instep 33 cm 35 cm Ankle Left: Right: Point of Measurement: 10 cm From Medial Instep 22 cm 20 cm Vascular Assessment Pulses: Dorsalis Pedis Palpable: [Left:Yes] [Right:Yes] Electronic Signature(s) Signed: 06/08/2022 8:01:30 AM By: Carlene Coria RN Entered By: Carlene Coria on 06/07/2022 10:35:53 [name] ([id number]) -------------------------------------------------------------------------------- Multi Wound Chart Details Patient Name: [name] Date of Service: 06/07/2022 10:15 AM Medical Record Number: [id number] Patient Account Number: [id number] Date of Birth/Sex: 08-Oct-1930 (88 y.o. F) Treating RN: Carlene Coria Primary Care : Fulton Reek Other Clinician: Referring : Fulton Reek Treating /Extender: Skipper Cliche in Treatment: 15 Vital Signs Height(in): 60 Pulse(bpm): 72 Weight(lbs): 132 Blood Pressure(mmHg): 152/66 Body Mass Index(BMI): 25.8 Temperature(F): 98.1 Respiratory Rate(breaths/min): 18 Photos: [15:No Photos] [N/A:N/A] Wound Location: Left Lower Leg Right Lower Leg N/A Wounding Event: Trauma Trauma N/A Primary Etiology: Skin Tear Venous Leg Ulcer N/A Comorbid History: Cataracts, Glaucoma, Optic Cataracts, Glaucoma, Optic N/A Neuritis, Chronic sinus Neuritis, Chronic sinus problems/congestion, Middle ear problems/congestion, Middle ear problems,  Hypertension, problems, Hypertension, Rheumatoid Arthritis Rheumatoid Arthritis Date Acquired: 02/09/2022 02/09/2022 N/A Weeks of Treatment: 15 15 N/A Wound Status: Open Open N/A Wound Recurrence: No No N/A Measurements L x W x D (cm) 4x5x0.1 0x0x0 N/A Area (cm) : 15.708 0 N/A Volume (cm) : 1.571 0 N/A % Reduction in Area: -925.30% 100.00% N/A % Reduction in Volume: -926.80% 100.00% N/A Classification: Full Thickness Without Exposed Full Thickness Without Exposed N/A Support Structures Support Structures Exudate Amount: Medium None Present N/A Exudate Type: Serosanguineous N/A N/A Exudate Color: red, brown N/A N/A Granulation Amount: Large (67-100%) None Present (0%) N/A Necrotic Amount: None Present (0%) None Present (0%) N/A Exposed Structures: Fat Layer (Subcutaneous Tissue): Fascia: No N/A Yes Fat Layer (Subcutaneous Tissue): Fascia: No No Tendon: No Tendon: No Muscle: No Muscle: No Joint: No Joint: No Bone: No Bone: No Epithelialization: None None N/A Treatment Notes Electronic Signature(s) Signed: 06/08/2022 8:01:30 AM By: Carlene Coria RN Entered By: Carlene Coria on 06/07/2022 10:36:06 [name] ([id number]) -------------------------------------------------------------------------------- [place] Details Patient Name: [name] Date of Service: 06/07/2022 10:15 AM Medical Record Number: [id number] Patient Account Number: [id number] Date of Birth/Sex: [birth year] (88 y.o. F) Treating RN: Carlene Coria Primary Care Carolee Channell: Fulton Reek Other Clinician: Referring Mckynzie Liwanag: Fulton Reek Treating Shatora Weatherbee/Extender: Skipper Cliche in Treatment: 15 Active Inactive Wound/Skin Impairment Nursing Diagnoses: Knowledge deficit related to ulceration/compromised skin integrity Goals: Patient/caregiver will verbalize understanding of skin care regimen Date Initiated: 02/17/2022 Date Inactivated: 03/21/2022 Target Resolution Date: 03/20/2022 Goal Status: Met Ulcer/skin breakdown will have a volume reduction of 30% by week 4 Date Initiated: 02/17/2022 Date Inactivated: 03/21/2022 Target Resolution Date: 03/20/2022 Goal Status: Met Ulcer/skin breakdown will have a volume reduction of 50% by week 8 Date Initiated: 02/17/2022 Target Resolution Date: 04/19/2022 Goal Status: Active Ulcer/skin breakdown will have a volume reduction of 80% by week 12 Date Initiated: 02/17/2022 Target Resolution Date: 05/20/2022 Goal Status: Active Ulcer/skin breakdown will heal within 14 weeks Date Initiated: 02/17/2022 Target Resolution Date: 06/19/2022 Goal Status: Active Interventions: Assess patient/caregiver ability to obtain necessary supplies Assess patient/caregiver ability to perform ulcer/skin care regimen upon admission and as needed Assess ulceration(s) every visit Notes: Electronic Signature(s) Signed: 06/08/2022 8:01:30 AM By: Carlene Coria RN Entered By: Carlene Coria on 06/07/2022 10:35:58 [name] ([id number]) -------------------------------------------------------------------------------- Pain Assessment Details Patient Name: [name] Date of Service: 06/07/2022 10:15 AM Medical Record Number: [id number] Patient Account Number: [id number] Date of Birth/Sex: 1930/03/19 (88 y.o. F) Treating RN: Carlene Coria Primary  Care Andriana Casa: Fulton Reek Other Clinician: Referring Marlei Glomski: Fulton Reek Treating Zarrah Loveland/Extender: Skipper Cliche in Treatment: 15 Active Problems Location of Pain Severity and Description of Pain Patient Has Paino No Site Locations Pain Management and Medication Current Pain Management: Electronic Signature(s) Signed: 06/08/2022 8:01:30 AM By: Carlene Coria RN Entered By: Carlene Coria on 06/07/2022 10:32:23 [name] ([id number]) -------------------------------------------------------------------------------- Patient/Caregiver Education Details Patient Name: [name] Date of Service: 06/07/2022 10:15 AM Medical Record Number: [id number] Patient Account Number: [id number] Date of Birth/Gender: 1930-11-22 (88 y.o. F) Treating RN: Carlene Coria Primary Care Physician: Fulton Reek Other Clinician: Referring Physician: Fulton Reek Treating Physician/Extender: Skipper Cliche in Treatment: 15 Education Assessment Education Provided To: Patient Education Topics Provided Wound/Skin Impairment: Methods: Explain/Verbal Responses: State content correctly Electronic Signature(s) Signed: 06/08/2022 8:01:30 AM By: Carlene Coria RN Entered By: Carlene Coria on 06/07/2022 10:47:32 [name] ([id number]) -------------------------------------------------------------------------------- Wound Assessment Details Patient Name: [name] Date of Service: 06/07/2022 10:15 AM Medical  Record Number: [id number] Patient Account Number: [id number] Date of Birth/Sex: 10/27/1930 (88 y.o. F) Treating RN: Carlene Coria Primary Care : Fulton Reek Other Clinician: Referring : Fulton Reek Treating /Extender: Skipper Cliche in Treatment: 15 Wound Status Wound Number: 13 Primary Skin Tear Etiology: Wound Location: Left Lower Leg Wound Open Wounding Event: Trauma Status: Date Acquired: 02/09/2022 Comorbid Cataracts, Glaucoma,  Optic Neuritis, Chronic sinus Weeks Of Treatment: 15 History: problems/congestion, Middle ear problems, Hypertension, Clustered Wound: No Rheumatoid Arthritis Photos Wound Measurements Length: (cm) 4 Width: (cm) 5 Depth: (cm) 0.1 Area: (cm) 15.708 Volume: (cm) 1.571 % Reduction in Area: -925.3% % Reduction in Volume: -926.8% Epithelialization: None Tunneling: No Undermining: No Wound Description Classification: Full Thickness Without Exposed Support Structures Exudate Amount: Medium Exudate Type: Serosanguineous Exudate Color: red, brown Foul Odor After Cleansing: No Slough/Fibrino No Wound Bed Granulation Amount: Large (67-100%) Exposed Structure Necrotic Amount: None Present (0%) Fascia Exposed: No Fat Layer (Subcutaneous Tissue) Exposed: Yes Tendon Exposed: No Muscle Exposed: No Joint Exposed: No Bone Exposed: No Treatment Notes Wound #13 (Lower Leg) Wound Laterality: Left Cleanser [place] Ancillary Kit - 15 Day Supply Discharge Instruction: Use supplies as instructed; Kit contains: (15) Saline Bullets; (15) 3x3 Gauze; 15 pr Gloves Soap and [address] FLOYD, LUSIGNAN ([id number]) Discharge Instruction: Gently cleanse wound with antibacterial soap, rinse and pat dry prior to dressing wounds Peri-Wound Care Topical Primary Dressing Silvercel Small 2x2 (in/in) Discharge Instruction: Apply Silvercel Small 2x2 (in/in) as instructed Secondary Dressing ABD Pad 5x9 (in/in) Discharge Instruction: Cover with ABD pad Secured With Medipore Tape - 17M Medipore H Soft Cloth Surgical Tape, 2x2 (in/yd) Kerlix Roll Sterile or Non-Sterile 6-ply 4.5x4 (yd/yd) Discharge Instruction: Apply Kerlix as directed Compression Wrap Compression Stockings Add-Ons Electronic Signature(s) Signed: 06/08/2022 8:01:30 AM By: Carlene Coria RN Entered By: Carlene Coria on 06/07/2022 10:33:41 [name]  ([id number]) -------------------------------------------------------------------------------- Wound Assessment Details Patient Name: [name] Date of Service: 06/07/2022 10:15 AM Medical Record Number: [id number] Patient Account Number: [id number] Date of Birth/Sex: 11-Aug-1930 (88 y.o. F) Treating RN: Carlene Coria Primary Care : Fulton Reek Other Clinician: Referring : Fulton Reek Treating /Extender: Skipper Cliche in Treatment: 15 Wound Status Wound Number: 15 Primary Venous Leg Ulcer Etiology: Wound Location: Right Lower Leg Wound Open Wounding Event: Trauma Status: Date Acquired: 02/09/2022 Comorbid Cataracts, Glaucoma, Optic Neuritis, Chronic sinus Weeks Of Treatment: 15 History: problems/congestion, Middle ear problems, Hypertension, Clustered Wound: No Rheumatoid Arthritis Wound Measurements Length: (cm) 0 Width: (cm) 0 Depth: (cm) 0 Area: (cm) 0 Volume: (cm) 0 % Reduction in Area: 100% % Reduction in Volume: 100% Epithelialization: None Tunneling: No Undermining: No Wound Description Classification: Full Thickness Without Exposed Support Structure Exudate Amount: None Present s Foul Odor After Cleansing: No Slough/Fibrino No Wound Bed Granulation Amount: None Present (0%) Exposed Structure Necrotic Amount: None Present (0%) Fascia Exposed: No Fat Layer (Subcutaneous Tissue) Exposed: No Tendon Exposed: No Muscle Exposed: No Joint Exposed: No Bone Exposed: No Electronic Signature(s) Signed: 06/08/2022 8:01:30 AM By: Carlene Coria RN Entered By: Carlene Coria on 06/07/2022 10:35:23 [name] ([id number]) -------------------------------------------------------------------------------- [place] Details Patient Name: [name] Date of Service: 06/07/2022 10:15 AM Medical Record Number: [id number] Patient Account Number: [id number] Date of Birth/Sex: Jun 15, 1930 (88 y.o. F) Treating RN: Carlene Coria Primary Care  : Fulton Reek Other Clinician: Referring : Fulton Reek Treating /Extender: Skipper Cliche in Treatment: 15 Vital Signs Time Taken: 10:20 Temperature (F): 98.1 Height (in): 60 Pulse (bpm): 71 Weight (lbs): 132 Respiratory Rate (  breaths/min): 18 Body Mass Index (BMI): 25.8 Blood Pressure (mmHg): 152/66 Reference Range: 80 - 120 mg / dl Electronic Signature(s) Signed: 06/08/2022 8:01:30 AM By: Carlene Coria RN Entered By: Carlene Coria on 06/07/2022 10:21:36

## 2022-06-13 ENCOUNTER — Encounter: Payer: Medicare Other | Attending: Physician Assistant | Admitting: Physician Assistant

## 2022-06-13 DIAGNOSIS — S81811A Laceration without foreign body, right lower leg, initial encounter: Secondary | ICD-10-CM | POA: Diagnosis not present

## 2022-06-13 DIAGNOSIS — L97822 Non-pressure chronic ulcer of other part of left lower leg with fat layer exposed: Secondary | ICD-10-CM | POA: Diagnosis not present

## 2022-06-13 DIAGNOSIS — M351 Other overlap syndromes: Secondary | ICD-10-CM | POA: Insufficient documentation

## 2022-06-13 DIAGNOSIS — X58XXXA Exposure to other specified factors, initial encounter: Secondary | ICD-10-CM | POA: Insufficient documentation

## 2022-06-13 DIAGNOSIS — M069 Rheumatoid arthritis, unspecified: Secondary | ICD-10-CM | POA: Diagnosis not present

## 2022-06-13 DIAGNOSIS — S81812A Laceration without foreign body, left lower leg, initial encounter: Secondary | ICD-10-CM | POA: Diagnosis not present

## 2022-06-13 DIAGNOSIS — L97812 Non-pressure chronic ulcer of other part of right lower leg with fat layer exposed: Secondary | ICD-10-CM | POA: Diagnosis not present

## 2022-06-13 DIAGNOSIS — I872 Venous insufficiency (chronic) (peripheral): Secondary | ICD-10-CM | POA: Diagnosis not present

## 2022-06-13 NOTE — Progress Notes (Addendum)
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 site which she rates to be a 3-5/10 depending on whether the area is being cleansed/touched or not. She always has some pain however. She does see [facility] and does have compression hose that she typically wears. She states however she has not been wearing them as much since she was dealing with this issue due to the fact that she notes that the wound seems to leak and bleed more when she has the compression hose  on. 11/22/17; patient was readmitted to clinic last week with a traumatic wound on her right anterior leg. This is a reasonably small wound but covered in an adherent necrotic debris. She is been using Santyl. 11/29/17 minimal improvement in wound dimensions to this initially traumatic wound on her right anterior leg. Reasonably small wound but still adherent thick necrotic debris. We have been using Santyl 12/06/17 traumatic wound on the right anterior leg. Small wound but again adherent necrotic debris on the surface 95%. We have been using Santyl 12/13/86; small lright anterior traumatic leg wound. Using Santyl that again with adherent debris perhaps down to 50%. I changed her to Iodoflex today 12/20/17; right anterior leg traumatic wound. She again presents with debris about 50% of the wound. I changed her to Iodoflex last week but so far not a lot in the way of response 12/27/17; right anterior leg traumatic wound. She again presents with debris on the wound although it looks better. She is using Iodoflex entering her third week now. Still requiring debridement 01/16/18 on evaluation today patient seems to be doing fairly well in regard to her right lower extremity ulcer. She has been tolerating the dressing changes without complication. With that being said she does note that she's been having a lot of burning with the current dressing which is specifically the Iodoflex. Obviously this is a known side effect of the iodine in the dressing and I believe that may be giving her trouble. No fevers, chills, nausea, or vomiting noted at this time. Otherwise the wound does appear to be doing well. 01/30/18 on evaluation today patient appears to be doing well in regard to her right anterior lower extremity ulcer. She notes that this does seem to be smaller and she wonders why we did not start the Prisma dressing sooner since it has made such a big difference in such a short amount of time. I explained that  obviously we have to wait for  the wound to get to a certain point along his healing path before we can initiate the Prisma otherwise it will not be effective. Therefore once the wound became clean it was then time to initiate the Prisma. Nonetheless good news is she is noting excellent improvement she does still have some discomfort but nothing as significant as previously noted. 04/17/18 on evaluation today patient appears to be doing very well and in fact her right lower extremity ulcer has completely healed at this point I'm pleased with this. The left lower extremity ulcer seem to be doing better although she still does have some openings noted the Prisma I think is helping more than the Xeroform was in my pinion. With that being said she still has a lot of healing to do in this regard. 04/27/18 on evaluation today patient appears to be doing very well in regard to her left lower Trinity ulcers. She has been tolerating the dressing changes without complication. I do have a note from her orthopedic surgeon today and they would like for me to help with treating her left elbow surgery site where she had the bursa removed and this was performed roughly 4 weeks ago according to the note that I reviewed. She has been placed on Bactrim DS by need for her leg wounds this probably helped a little bit with the left elbow surgery site. Obviously I do think this is something we can try to help her out with. 05/04/18 on evaluation today patient appears to be doing well in regard to her left anterior lower Trinity ulcers. She is making good progress which is great news. Unfortunately her elbow which we are also managing at this point in time has not made as much progress unfortunately. She has been tolerating the dressing changes without complication. She did see Dr. Benson Setting earlier today and he states that he's willing to give this three weeks to see if she's making any progress with wound care. However he states that  she's really not then he will need to go back in and perform [name], [name] ([id number]) further surgery. Obviously she is trying to avoid surgery if at all possible although I'm not sure if this is going to be possible or least not that quickly. 05/11/18 on evaluation today patient appears to be doing very well in regard to her left lower extremity ulcers. Unfortunately in regard to her elbow this is very slow coming about as far as any improvement is concerned. I do feel like there may be a little bit more granulation noted in the base of the wound but nothing too significant unfortunately. I still can probe bone in the proximal portion of the wound which obviously explain to the patient is not good. She will be having a follow-up with her orthopedic surgeon in the next couple of weeks. In the meantime we are trying to do as much as we can to try to show signs of improvement in healing to avoid the need for any additional and further surgery. Nonetheless I explained to the patient yet again today I'm not sure if that is going to be feasible or not obviously it's more risk for her to continue to have an open wound with bone exposure then to the back in for additional surgery even though I know she doesn't want to go that route. 05/15/18 on evaluation today patient presents for follow-up concerning her ongoing lower extremity ulcers on the left as well as the left elbow ulcer. She has  at this point in time been tolerating the dressing changes without complication. Her left lower extremity ulcer appears to be doing very well. In regard to the left elbow ulcer she actually does seem to have additional granulation today which is good news. I am definitely seeing signs of improvement although obviously this is somewhat slow improvement. Nonetheless I'm hopeful we will be able to avoid her having to have any further surgery but again that would definitely be a conversation between herself as well as her  surgeon once he sees her for reevaluation. Otherwise she does want to see about having a three order compression stockings for her today 05/21/18 on evaluation today patient appears to be doing well in regard to her left lower surety ulcer. This is almost completely healed and seems to be progressing very nicely. With that being said her left elbow is another story. I'm not really convinced in the past three weeks we've seen a significant improvement in this wound. With that being said if this is something that there is no surgical option for him we have to continue to work on this from the standpoint of conservative management with wound care she may make improvement given time. Nonetheless it appears that her surgeon is somewhat concerned about the possibility of infection and really is leaning towards additional surgery to try and help close this wound. Nonetheless the patient is still unsure of exactly what to do. 05/29/18 on evaluation today patient appears to be doing well in regard to her left lower extremity ulcer. She's been tolerating the dressing changes without complication which is good news. With that being said she's been having issues specifically with her elbow she did see her surgeon Dr. Roland Rack and he is recommending a repeat surgery to the left elbow in order to correct the issue. The patient is still somewhat unsure of this but feels like this may be better than trying to take time to let this heal over a longer period of time through normal wound care measures. Again I explained that I agree this may be a faster way to go if her surgeon feels that this is indeed a good direction to take. Obviously only he can make the judgment on whether or not the surgery would likely be successful. 06/04/18 on evaluation today patient actually presents for follow-up concerning her left elbow and left lower from the ulcer she seems to be doing very well at this point in time. She has been tolerating  the dressing changes without complication. With that being said her elbow is not significantly better she actually is scheduled for surgery tomorrow. 07/04/18; the patient had an area on her left leg that is remaining closed. The open area she has now is a postsurgical wound on the left elbow. I think we have clearance from the surgeon to see this now. We're using Prisma 07/11/18; we're currently dealing with a surgical wound on the left olecranon process. The patient complains of a lot of pain and drainage. When I saw her last week we did an x-ray that showed soft tissue wound and probable elbow joint effusion but no erosion to suggest osteomyelitis. The culture I did of this was somewhat surprisingly negative. She has a small open wound with not a viable surface there is considerable undermining relative to the wound size. She is on methotrexate for rheumatoid arthritis/overlap syndrome also plaquenil. We've been using silver collagen 07/18/18-She is seen in follow-up evaluation for a left elbow wound. There is essentially  no change. She is currently on Zithromax and will complete that on Friday, there is no indication to extend this. We will change to iodosorb/iodoflex and monitor for response 07/25/18-She is seen in follow-up evaluation for left elbow wound. The wound is stable with no overt evidence of infection. She has counseled with her rheumatologist. She is wanting to restart her methotrexate; a culture was obtained to rule out occult infection before starting her methotrexate. We will continue with Iodosorb/Iodoflex and she will follow-up next week. 08/01/18; this is a difficult wound over her left olecranon process. There is been concerned about infection although cultures including one done last week were negative. Pending 3 weeks ago I gave her an empiric course of antibiotics. She is having a lot of rheumatologic pain in her hands with pain and stiffness. She wants to go on her weekly  methotrexate and I think it would be reasonable to do so. We have been using Iodoflex 08/01/18; difficult wound over her left olecranon process. She started back on methotrexate last week because of rheumatologic pain in her hands. We have been using Iodoflex to try and clean out the wound bed. She has been approved for Graphix PL 08/15/18; 2 week follow-up. Difficult wound over her left olecranon process. Graphix PL #1 with collagen backing 08/22/18; one-week follow-up. Difficult wound over her left olecranon process. Graphix PL #2 08/29/18; no major improvement. Difficult wound over her left olecranon process. Still considerable undermining. Graphics PL #3 1 week follow-up. Graphix #4 09/12/18 graphics #5. Some improvement in wound area although the undermining superiorly still has not closed down as much as I would like 09/19/18; Graphix #6 I think there is improvement in the undermining from 7 to 9:00. Wound bed looks healthy. 09/26/18 Graffix #7 undermining is 0.5 cm maximally at roughly 8:00. From 12 to 7:00 the tissue is adherent which is a major improvement there is some advancing skin from this side. 10/03/18; Graphix #8 no major changes from last week 10/10/18 Graffix #9 There are improvements. There appears to be granulation coming up to the surface here and there is a lot less undermining at 8:00. 10/17/18. Graffix #10; Dimensions are improved less undermining surface felt the but the wound is still open. Initially a surgical wound following a bursectomy 10/24/18; Graffix #11. This is really stalled over the last 2 weeks. If there is no further improvement this will be the last application.The final option for this difficult area would be plastic surgery and will set up a consult with Dr. Lorayne Bender in [facility] 10/31/18; wound looks about the same. The undermining superiorly is 0.7 cm. On the lateral edges perhaps some improvement there is no drainage. 11-07-2018 patient seen today for  follow-up and management of left elbow wound. She has completed a total of 11 treatments of the graffix with not much improvement. She has an upcoming appointment with plastic surgery to assist with additional treatment options for the left elbow wound on 11/19/18. There is significant amount surrounding undermining of the wound is 0.9 cm. Currently prescribed methotrexate. Wound is being treated with Indoform and border dressing. No drainage from wound. No fever, chills. or pain. 11/21/18; the patient continues to have the wound looking roughly the same with undermining from about 12 to 6:00. This has not changed all that much. She does have skin irritation around the wound that looks like drainage maceration issues. The patient states that she was not able to have her wound dressing changed because of illness in the person  he usually does this. She also did not attend her clinic appointment today with Dr. Lorayne Bender because of transportation issues. She is rebooked for some time in mid January 11/28/2018; the patient has less undermining using endoform. As a understandings she saw Dr. Doy Mince who is Dr. Rebekah Chesterfield partner. He recommended putting her in a elbow brace and I believe is written a prescription for it. He also recommended Motrin 800 mg 3 times daily. This is prescription strength ibuprofen although he did not write his prescription. This apparently was for 2 weeks. Culture I did last time grew a few [name], [name] ([id number]) methicillin sensitive staph aureus. After some difficulty due to drug intolerances/allergies and drug interactions I settled on a 5-day course of azithromycin 12/03/18 on evaluation today patient actually appears to be doing fairly well in regard to her elbow when compared to last time I evaluated her. With that being said there does not appear to be any signs of infection at this time. That was the big concern currently as far as the patient was concerned.  Nonetheless I do feel like she is making progress in regard to the feeling of this ulcer it has been slow. She did see a Psychiatric nurse they are talking about putting her in a brace in order to allow this area to heal more appropriately. 12/19/2018; not much change in this from the last time I have saw this.'s much smaller area than when she first came in and with less circumferential undermining however this is never really adhered. She is wearing the brace that was given or prescribed to her by plastics. She did not have a procedure offered to attempt to close this. We have been using endoform 1/15; wound actually is not doing as well as last week. She was actually not supposed to come into this clinic again until next week but apparently her attendant noticed some redness increasing pain and she came in early. She reports the same amount of drainage. We have been using endoform. She is approved through puraply however I will only consider starting that next week 1/22; she completed the antibiotics last week. Culture I did was negative. In spite of this there is less erythema and pain complaints in the wound. Puraply #1 applied today 1/29; Puraply #2 today. Wound surface looks a lot better post debridement of adherent fibrinous material. However undermining from 6-12 is measuring worse 2/5; Puraply #3. Using her elbow brace 2/12 puraply #4 2/19 puraply #5. The 9:00 undermining measured at 0.5 cm. Undermining from 4-11 o'clock. Surface of the wound looks better and the circumference of the wound is smaller however the undermining is not really changed 2/26; still not much improvement. She has undermining from 4-9 o'clock 0.9 cm. Surface of the wound covered and adherent debris. 3/4; still no improvement. Undermining from 4-9 o'clock still around a centimeter. Surface of the wound looks somewhat better. No debridement is required we used endoform after we ended the trial of puraply last week 3/11;  really no improvement at all. Still 1 cm undermining from roughly 9-3 o'clock. This is about a centimeter. The base of the wound looks fairly healthy. No debridement. We have been using endoform. I am really out of most usual options here. I could consider either another round of an amniotic advanced treatment product example epifix or perhaps regranex. Understandably the patient is a bit frustrated. We did send her to plastic surgery for a consult. Other than prescribing her a brace to immobilize the  elbow they did not think she was a candidate for any further surgery. Notable that the patient is not using the brace today 3/18-Patient returns for attention to the left elbow area which apparently looked red at the home health visit. Patient's elbow looks the same if not better compared with last visit. The area of ulceration remains the same, the base appears healthy. We are continuing to use endoform she has been encouraged to use the brace to keep the elbow straight 3/25; the patient has an appointment at the [facility] wound care center on 4/2. I had actually put her out indefinitely however she seems to want to come back here every week. This week she complains of increased pain and malodor. Dimensions of the elbow wound are larger. We have been using endoform 4/8; the patient went to [facility] where they apparently gave her meta honey and some border foam with a Tubigrip. She has been using this for a week. She says she was very impressed with them there. They did not offer her any surgical consultation. She seems to be coming back here for a second opinion on this, she does not wish to drive to [facility] every week 4/22; still using Medihoney foam border and a Tubigrip. Actually do not think she has anything on the arm at all specifically she is not using her brace 5/6; she has been using medihoney without a lot of improvement. Undermining maximum at 9:00 at 0.5 cm may be somewhat better. She is  still complaining of discomfort. She uses her elbow brace at night but is not using anything on the arm during the day, she finds it too restrictive 5/13; we switch the patient to endoform AG last week. She is complaining of more pain and worried about some circumferential erythema around the wound. I had planned to consider epifix in this wound however the patient came in with a request to see a plastic surgeon in [place] by the name of Dr. Harl Bowie who cared for a friend of hers. Noteworthy that I have already sent her to one plastic surgeon and she went for another second opinion at [facility] and they apparently did not send her to a plastic surgeon nevertheless the patient is fairly convinced that she might benefit from a skin graft which I am doubtful. She also has rheumatoid arthritis and is on methotrexate. This was originally a surgical wound for a bursectomy a year or 2 ago Readmission: 06/28/19 on evaluation today patient presents today for reevaluation but this is due to a new issue her elbow has completely healed and looks excellent. Her right anterior lower leg has a skin tear which was sustained from her dog who jumped up on her and inadvertently scratch the area causing the skin tear. This happened yesterday. She is having some discomfort but fortunately nothing too significant which is good news. No fevers, chills, nausea, or vomiting noted at this time. The skin fortunately was knocked one completely off and we are gonna see about we approximate in the skin as best we can in using Steri-Strips to hold this in place obviously if we can get some of this to reattach that would be beneficial 07/05/2019 on evaluation today patient actually appears to be doing okay although unfortunately the skin flap that we were attempting to Erick down last week did not take. She is developing a lot of fluid underneath the wound area unfortunately which again is not ideal. I think that this  necrotic tissue needs to  be removed and again it actually just wiped off during the evaluation today as I was attempting to clean the wound there did not appear to be any significant issues underlying which is good although there was some purulent drainage I did want to go ahead and see about obtaining a culture from today in order to ensure that the Z-Pak that I placed her on earlier in the week was appropriate for treating what ever infection may be causing the issue currently. This was a deep wound culture obtained today. 07/12/2019 on evaluation today patient actually appears to be doing quite well with regard to her right lower extremity ulcer all things considering. There is still little bit of hematoma not it that is noted in the central portion of the wound along with some necrotic tissue but she is still having quite a bit of discomfort. For that reason I did not perform sharp debridement today although this wound does need some debridement of one type or another. I think we may attempt Iodoflex to see if this can be of benefit. Fortunately there is no signs of active infection at this point. 07/18/19 upon evaluation today patient appears to be doing better with regard to her ulcer on her right lower extremity. She's been tolerating the dressing changes without complication. The good news is she seems to be making excellent progress. Overall I'm pleased with there being no signs of infection. With that being said she does tell me that she's been having some discomfort with the wrap she's unsure of exactly what about the rafters causing her trouble. 07/25/2019 on evaluation today patient appears to be doing a little better in regard to her lower extremity ulcer. Unfortunately the alginate seems to be getting really stuck to the wound bed and surrounding periwound. Fortunately there is no signs of active infection at this time. There does not appear to be any evidence of infection  currently. 08/01/2019 on evaluation today patient actually appears to be doing much better with regard to her right lower extremity ulcer. She has been [place], [place] V. ([id number]) tolerating the dressing changes without complication. Fortunately her wound does not show any signs of infection and seems to be making good progress. No fevers, chills, nausea, vomiting, or diarrhea. 08/08/2019 on evaluation today patient actually appears to be doing better with regard to her leg ulcer. She has been tolerating the dressing changes without complication. With that being said I think we may want to switch the dressing up today just based on the appearance of the wound bed in general. Fortunately there is no evidence of infection currently. Her pain seems to be much better. Her son is present with her today. 08/15/2019 on evaluation today patient appears to be doing very well with regard to her right lower extremity ulcer. She still complains about feeling like the compression stocking/Tubigrip is too tight for her. Nonetheless I still think this is beneficial and is helping the wound to heal more effectively. Overall I am extremely pleased at this time with what I am seeing. 08/22/2019 on evaluation today patient actually appears to be doing quite well with regard to her right lower extremity. She has been tolerating the dressing changes without complication. Fortunately there is no signs of active infection at this time. She still complains about the Tubigrip but nonetheless I think this is something that is of utmost importance for her to continue to wear if she is going to see this area heal appropriately. 08/29/2019 on evaluation today  patient actually appears to be doing well visually in regard to her wound. Unfortunately in regard to overall pain she seems to be having more pain today which I am somewhat concerned about. There does not appear to be any signs of active infection that I can tell but again  with increased pain that is definitely a concern here today. I do think it may be time to switch up the dressing as well to something that will be a little bit more effective hopefully and new tissue growth she has done well with collagen in the past. 09/05/2019 on evaluation today patient actually appears to be doing well with regard to her leg ulcer. She has been tolerating the dressing changes without complication. Fortunately there is no signs of active infection. We did obtain a wound culture last week due to the fact that she still is very touchy as far as the surface of the wound is concerned. Unfortunately I am not sure what happened to that culture as there is no record in the computer system. Nonetheless we need to follow-up on this and try to see if we can identify what exactly happened with this specimen as we have not gotten a result back either obviously. 09/12/2019 on evaluation today patient actually appears to be showing signs of good improvement upon evaluation today. She states her pain is not nearly as bad as what it was which is also good news. She did have a wound culture which was reviewed today that showed no growth of bacteria which is good news. This coupled with the fact that her wound appears to be healing better, measuring smaller, and overall even the slough buildup is minimal compared to what we have seen previously, I feel like she is actually showing signs of excellent improvement today. 09/19/2019 on evaluation today patient appears to be doing well with regard to her wounds on evaluation today. She is showing signs of continued improvement week by week and everything is measuring significantly smaller which is great news. Overall very pleased with the progress that she has made. 09/26/2019 on evaluation today patient appears to be doing fairly well with regard to her right lower extremity ulcer. She does continue to have some drainage and again the smaller of the 2  wounds is not making as much progress as I like to see. We have been doing the silver collagen for some time I think we may want to switch to [facility] she has a little bit of hyper granular tissue this may help to keep things from becoming too dramatic in that regard. Fortunately there is no signs of infection at this time. 10/03/2019 on evaluation today patient appears to be doing fairly well with regard to her lower extremity ulcers at this time. She has been tolerating the dressing changes without complication. Fortunately the wound is measuring smaller which is great news she continues to make great progress. 10/17/2019 on evaluation today patient appears to be doing well with regard to her wounds of her right lower extremity. She states she has been having some increased pain apparently we gave her a little bit smaller size Tubigrip last time she was here this may have been what was causing some issues. She is out of the Tubigrip that we had ordered for her and therefore did not have any of the smaller size. Fortunately there is no evidence of active infection at this time. No fever chills noted 10/24/2019 upon evaluation today patient's wounds actually appear to be  showing signs of good improvement which is excellent news. She has been tolerating the dressing changes without complication and overall I am rather pleased at this point with how things seem to be progressing. The patient likewise is very happy. She still was not happy with having to use the Tubigrip she states that bunches up in her shoe. 10/31/2019 on evaluation today patient actually appears to be doing quite well with regard to her leg ulcer. She has been tolerating the dressing changes without complication. Fortunately there is no signs of active infection at this time. Overall I am very pleased with the progress that she is made. She does wonder if we could switch to the bordered adhesive Hydrofera Blue I think that  something we can definitely look into for her. 11/15/2019 patient on evaluation today appears to be making excellent progress. Her wound is significantly smaller and the wound bed appears to be dramatically improved compared to even the last visit. Fortunately she is having really little to no pain at this time. No fevers, chills, nausea, vomiting, or diarrhea. 11/25/2019 on evaluation today patient appears to be doing well with regard to her lower extremity ulcer. She has been tolerating dressing changes without complication. Fortunately there is no signs of active infection at this time. No fevers, chills, nausea, vomiting, or diarrhea. 12/02/2019 on evaluation today patient unfortunately is not doing quite as well as what we would have liked to have seen based on last week's evaluation. Fortunately there is no signs of active infection at this time. With that being said she is continuing to experience some issues with hyper granulation she was doing much better with the [facility] prior to putting the contact layer in between her skin and the [facility] I think that we need to go back to that. She just can have to be very careful with removing the dressing in order to ensure it does not pull skin and get stuck. 12/16/2019 on evaluation today patient appears to be doing better her wound is measuring significantly smaller at this time compared to where things were previous. Fortunately there is no evidence of active infection at this point which is also good news. No fever chills noted. 12/23/2019 upon evaluation today patient appears to be doing excellent in regard to her lower extremity ulcer. She has been tolerating the dressing changes without complication. Fortunately there is no signs of active infection at this time. Overall I am very pleased with the fact that she is doing so well today. READMISSION 08/12/2020 [name] ([id number]) [name] is a now 88 year old woman we have  had in this clinic on at least 2 occasions. She spent a protracted period of time here for a postsurgical wound on her left olecranon and then more recently towards the end of last year earlier this year was in for a skin tear on her left lower leg caused by her dog. She comes in today with a reasonably acute area on her left forearm again apparently caused by her dog although it was not a bite injury. She was seen in urgent care and they managed to reoppose a lot of the skin tear although the patient states that is now reopened slightly. They have been using Neosporin. She has very fragile skin probably a combination of chronic steroid use for rheumatoid arthritis [although I do not see that she is on that now] solar skin damage. I note that she takes methotrexate and Plaquenil for her rheumatoid arthritis 9/15;  this patient had a skin tear on her left forearm from trauma with her dog. She was seen in urgent care and they reapposed most of the separated skin. Fortunately for her most of this is remained intact and the skin above and below the wound actually looks healthy. Where there was a separation and the 2 flaps of skin there is still an open area however the granulation tissue looks healthy here. We have been using Xeroform however I had like to change to [facility] 9/29; skin tear on her left arm from a dog scratch. The skin was reapposed in an urgent care and surprisingly stayed viable. We used Hydrofera Blue last time the wound is smaller 10/13; patient comes back to clinic after 2 weeks for a left arm wound secondary to a dog scratch. The skin was reapposed and in urgent care. We have been using Hydrofera Blue and the wound is closed today. Readmission 02/17/2022 Ms. Velva Barnett is an 88 year old female with a past medical history of chronic venous insufficiency and lymphedema, COPD, rheumatoid arthritis and hypertension that presents with a 2-week history of wounds to her lower  extremities bilaterally. She states she had a skin tag to the right posterior leg that came off and became a wound. She states the area has slowly gotten larger. She was seen by her primary care physician's office on 02/15/2022 for this issue. She was prescribed Bactroban and cefdinir. She has 2 other wounds 1 located to each leg. These were from trauma. She states one was from trying to put on compression stockings. Her ABIs in office on the left was 0.93 and 1.0 on the right. She currently denies systemic signs of infection. 02/24/2022 upon evaluation today patient appears to be doing well currently in regard to her skin tears. In general I feel like she is making good progress. Fortunately I do not see any evidence of active infection locally or systemically which is great news. No fevers, chills, nausea, vomiting, or diarrhea. 03/04/2022 upon evaluation patient actually appears to be doing well in regard to the wounds on her legs. She has been tolerating the dressing changes without complication. Overall I think all are showing signs of improvement which is great news. 03/10/2022 upon evaluation today patient appears to be doing better in regard to her wounds in general. I am very pleased with where things stand. I do believe that she is showing signs of good improvement which is great news. No fevers, chills, nausea, vomiting, or diarrhea. 03-21-2022 upon evaluation today patient appears to be doing well with regard to her wound on the left leg and the anterior right leg both are showing signs of significant improvement. Unfortunately she has a lot of slough and biofilm buildup on the surface of the posterior wound which is not good. I do not see any signs of active infection locally or systemically which is great news. No fevers, chills, nausea, vomiting, or diarrhea. 03-29-2022 upon evaluation today patient appears to be doing well currently in regard to her wounds. She has been tolerating the  dressing changes without complication. Fortunately there does not appear to be any signs of active infection at this time which is great news. No fevers, chills, nausea, vomiting, or diarrhea. 4/27; right posterior calf and left lateral calf both initially traumatic in the setting of significant venous insufficiency. She did not tolerate compression wraps apparently because she could not get shoes on her feet and therefore we have been using Tubigrip's and silver alginate  5/8; right posterior calf 100% covered with adherent debris. The area on the left lateral really looks quite good under illumination islands of new epithelialization. We have been using Xeroform on the left Iodoflex on the right bilateral 2 layer Tubigrip's 04-28-2022 upon evaluation today patient appears to be doing well with regard to her wound she has been tolerating the dressing changes which is great news. Overall I feel like the Xeroform is doing well on the left leg this is pretty much healed on the right leg she is very close to being healed as well I think we can probably switch over to Xeroform here as well. 05-05-2022 upon evaluation today patient appears to be doing excellent in regard to her wounds both are showing signs of being very close to complete resolution which is awesome news. Fortunately I do not see any signs of active infection at this time which is great news and in general I think we are headed in the right direction. 05-13-2022 upon evaluation today patient appears to be doing well with regard to her wounds. She however does have some breakdown due to the fact that I think is staying too moist on the left lateral aspect. The right is also staying a little bit too moist in my opinion. Subsequently I think that we may want to switch the dressings currently. I would recommend switching over to a silver cell dressing. 6/13; apparently the patient was felt to be having home health by [facility] therefore she did not  receive supplies for her wound care. Current dressing remained on for for 5 days. Removed today by her intake nurse with purulent drainage and the left lateral lower leg is considerably larger also slightly larger on the right. They are changing this usually every second day. At this point I am not really sure why the patient flagged as having home health 05-31-2022 upon evaluation today patient appears to be doing well with regard to her wounds in general. Fortunately I do not see any signs of active infection at this time systemically though locally she did have an infection last week and she is doing some better but was only able to take 3 days total of her medication. I will go ahead and get her sent in a prescription for doxycycline because the Bactrim was causing her stomach upset and I think probably the treatment matter is that she will benefit from at least 7 to 10 days of this doxycycline to make sure the infection is completely gone. 06-07-2022 upon evaluation today patient appears to be doing better. Her right leg is completely healed which is great news. The left leg is improving though not completely healed I think we are on the right track here still and I am very pleased with where things stand at this point. There does not appear to be any sign of infection at all at this time. 06-13-2022 upon evaluation today patient appears to be doing well with regard to her wound in fact this is showing signs of excellent improvement which is great news. Fortunately there does not appear to be any evidence of active infection locally or systemically at this time. No fevers, chills, nausea, vomiting, or diarrhea. [name], [name] ([id number]) Objective Constitutional Well-nourished and well-hydrated in no acute distress. Vitals Time Taken: 3:20 PM, Height: 60 in, Weight: 132 lbs, BMI: 25.8, Temperature: 97.8 F, Pulse: 61 bpm, Respiratory Rate: 16 breaths/min, Blood Pressure: 136/76  mmHg. Respiratory normal breathing without difficulty. Psychiatric this patient is able  to make decisions and demonstrates good insight into disease process. Alert and Oriented x 3. pleasant and cooperative. General Notes: Upon inspection patient's wound bed actually showed signs of good granulation and epithelization at this point. Fortunately I do not see any signs of active infection I think she is making excellent progress at this time. Integumentary (Hair, Skin) Wound #13 status is Open. Original cause of wound was Trauma. The date acquired was: 02/09/2022. The wound has been in treatment 16 weeks. The wound is located on the Left Lower Leg. The wound measures 2cm length x 1.8cm width x 0.1cm depth; 2.827cm^2 area and 0.283cm^3 volume. There is Fat Layer (Subcutaneous Tissue) exposed. There is no tunneling or undermining noted. There is a medium amount of serosanguineous drainage noted. There is large (67-100%) granulation within the wound bed. There is no necrotic tissue within the wound bed. Assessment Active Problems ICD-10 Laceration without foreign body, left lower leg, initial encounter Laceration without foreign body, right lower leg, initial encounter Non-pressure chronic ulcer of other part of right lower leg with fat layer exposed Non-pressure chronic ulcer of other part of left lower leg with fat layer exposed Venous insufficiency (chronic) (peripheral) Plan Follow-up Appointments: Return Appointment in 1 week. Bathing/ Shower/ Hygiene: May shower; gently cleanse wound with antibacterial soap, rinse and pat dry prior to dressing wounds No tub bath. Anesthetic (Use 'Patient Medications' Section for Anesthetic Order Entry): Lidocaine applied to wound bed Edema Control - Lymphedema / Segmental Compressive Device / Other: Tubigrip double layer applied - size c Elevate, Exercise Daily and Avoid Standing for Long Periods of Time. Elevate legs to the level of the heart and pump  ankles as often as possible Elevate leg(s) parallel to the floor when sitting. Off-Loading: Turn and reposition every 2 hours - recommend prop legs/heels so wounds do not touch the bed/chair Additional Orders / Instructions: Follow Nutritious Diet and Increase Protein Intake WOUND #13: - Lower Leg Wound Laterality: Left Cleanser: Byram Ancillary Kit - 15 Day Supply (Generic) Every Other Day/30 Days Discharge Instructions: Use supplies as instructed; Kit contains: (15) Saline Bullets; (15) 3x3 Gauze; 15 pr Gloves Cleanser: Soap and Water Every Other Day/30 Days Discharge Instructions: Gently cleanse wound with antibacterial soap, rinse and pat dry prior to dressing wounds MAHOGANI, HOLOHAN ([id number]) Primary Dressing: Silvercel Small 2x2 (in/in) (Generic) Every Other Day/30 Days Discharge Instructions: Apply Silvercel Small 2x2 (in/in) as instructed Secondary Dressing: ABD Pad 5x9 (in/in) (Generic) Every Other Day/30 Days Discharge Instructions: Cover with ABD pad Secured With: Medipore Tape - 8M Medipore H Soft Cloth Surgical Tape, 2x2 (in/yd) (Generic) Every Other Day/30 Days Secured With: Kerlix Roll Sterile or Non-Sterile 6-ply 4.5x4 (yd/yd) Every Other Day/30 Days Discharge Instructions: Apply Kerlix as directed 1. I Georgina Peer suggest that we go ahead and continue with the wound care measures as before and the patient is in agreement this includes the silver alginate dressing which I think is doing quite well. 2. Also can recommend ABD pad to cover followed by the Tubigrip double layer which is doing awesome for her. We will see patient back for reevaluation in 1 week here in the clinic. If anything worsens or changes patient will contact our office for additional recommendations. Electronic Signature(s) Signed: 06/13/2022 3:37:04 PM By: Worthy Keeler PA-C Entered By: Worthy Keeler on 06/13/2022 15:37:03 [name]  ([id number]) -------------------------------------------------------------------------------- SuperBill Details Patient Name: [name] Date of Service: 06/13/2022 Medical Record Number: [id number] Patient Account Number: [id number] Date of Birth/Sex:  07/15/1930 (88 y.o. F) Treating RN: [name] Primary Care Provider: Fulton Reek Other Clinician: Referring Provider: Fulton Reek Treating Provider/Extender: Skipper Cliche in Treatment: 16 Diagnosis Coding ICD-10 Codes Code Description [phone] Laceration without foreign body, left lower leg, initial encounter S81.811A Laceration without foreign body, right lower leg, initial encounter L97.812 Non-pressure chronic ulcer of other part of right lower leg with fat layer exposed L97.822 Non-pressure chronic ulcer of other part of left lower leg with fat layer exposed I87.2 Venous insufficiency (chronic) (peripheral) Facility Procedures CPT4 Code: [id number] Description: [phone] - WOUND CARE VISIT-LEV 2 EST PT Modifier: Quantity: 1 Physician Procedures CPT4 Code: [id number] Description: 22482 - WC PHYS LEVEL 3 - EST PT Modifier: Quantity: 1 CPT4 Code: Description: ICD-10 Diagnosis Description S81.812A Laceration without foreign body, left lower leg, initial encounter S81.811A Laceration without foreign body, right lower leg, initial encounter L97.812 Non-pressure chronic ulcer of other part of right  lower leg with fat la L97.822 Non-pressure chronic ulcer of other part of left lower leg with fat lay Modifier: yer exposed er exposed Quantity: Electronic Signature(s) Signed: 06/13/2022 3:37:14 PM By: Worthy Keeler PA-C Entered By: Worthy Keeler on 06/13/2022 15:37:13

## 2022-06-13 NOTE — Progress Notes (Addendum)
TELISSA, PALMISANO ([id number]) Visit Report for 06/13/2022 Arrival Information Details Patient Name: [name], [name]. Date of Service: 06/13/2022 2:45 PM Medical Record Number: [id number] Patient Account Number: [id number] Date of Birth/Sex: 08/11/1930 (88 y.o. F) Treating RN: Carlene Coria Primary Care Tonette Koehne: Fulton Reek Other Clinician: Referring Rheana Casebolt: Fulton Reek Treating Odus Clasby/Extender: Skipper Cliche in Treatment: 73 Visit Information History Since Last Visit All ordered tests and consults were completed: No Patient Arrived: Wheel Chair Added or deleted any medications: No Arrival Time: 15:12 Any new allergies or adverse reactions: No Accompanied By: son Had a fall or experienced change in No Transfer Assistance: None activities of daily living that may affect Patient Identification Verified: Yes risk of falls: Secondary Verification Process Completed: Yes Signs or symptoms of abuse/neglect since last visito No Patient Requires Transmission-Based Precautions: No Hospitalized since last visit: No Patient Has Alerts: Yes Implantable device outside of the clinic excluding No Patient Alerts: NOT diabetic cellular tissue based products placed in the center since last visit: Has Dressing in Place as Prescribed: Yes Pain Present Now: No Electronic Signature(s) Signed: 06/13/2022 4:23:03 PM By: Carlene Coria RN Entered By: Carlene Coria on 06/13/2022 15:17:29 [name] ([id number]) -------------------------------------------------------------------------------- Clinic Level of Care Assessment Details Patient Name: [name] Date of Service: 06/13/2022 2:45 PM Medical Record Number: [id number] Patient Account Number: [id number] Date of Birth/Sex: 11/03/1930 (88 y.o. F) Treating RN: Carlene Coria Primary Care Jerol Rufener: Fulton Reek Other Clinician: Referring Iktan Aikman: Fulton Reek Treating Prithvi Kooi/Extender: Skipper Cliche in Treatment: 16 Clinic  Level of Care Assessment Items TOOL 4 Quantity Score X - Use when only an EandM is performed on FOLLOW-UP visit 1 0 ASSESSMENTS - Nursing Assessment / Reassessment X - Reassessment of Co-morbidities (includes updates in patient status) 1 10 X- 1 5 Reassessment of Adherence to Treatment Plan ASSESSMENTS - Wound and Skin Assessment / Reassessment X - Simple Wound Assessment / Reassessment - one wound 1 5 '[]'  - 0 Complex Wound Assessment / Reassessment - multiple wounds '[]'  - 0 Dermatologic / Skin Assessment (not related to wound area) ASSESSMENTS - Focused Assessment '[]'  - Circumferential Edema Measurements - multi extremities 0 '[]'  - 0 Nutritional Assessment / Counseling / Intervention '[]'  - 0 Lower Extremity Assessment (monofilament, tuning fork, pulses) '[]'  - 0 Peripheral Arterial Disease Assessment (using hand held doppler) ASSESSMENTS - Ostomy and/or Continence Assessment and Care '[]'  - Incontinence Assessment and Management 0 '[]'  - 0 Ostomy Care Assessment and Management (repouching, etc.) PROCESS - Coordination of Care X - Simple Patient / Family Education for ongoing care 1 15 '[]'  - 0 Complex (extensive) Patient / Family Education for ongoing care '[]'  - 0 Staff obtains Programmer, systems, Records, Test Results / Process Orders '[]'  - 0 Staff telephones HHA, Nursing Homes / Clarify orders / etc '[]'  - 0 Routine Transfer to another Facility (non-emergent condition) '[]'  - 0 Routine Hospital Admission (non-emergent condition) '[]'  - 0 New Admissions / Biomedical engineer / Ordering NPWT, Apligraf, etc. '[]'  - 0 Emergency Hospital Admission (emergent condition) X- 1 10 Simple Discharge Coordination '[]'  - 0 Complex (extensive) Discharge Coordination PROCESS - Special Needs '[]'  - Pediatric / Minor Patient Management 0 '[]'  - 0 Isolation Patient Management '[]'  - 0 Hearing / Language / Visual special needs '[]'  - 0 Assessment of Community assistance (transportation, D/C planning, etc.) '[]'  -  0 Additional assistance / Altered mentation '[]'  - 0 Support Surface(s) Assessment (bed, cushion, seat, etc.) INTERVENTIONS - Wound Cleansing / Measurement KALIFA, CADDEN V. ([id number]) X- 1  5 Simple Wound Cleansing - one wound '[]'  - 0 Complex Wound Cleansing - multiple wounds X- 1 5 Wound Imaging (photographs - any number of wounds) '[]'  - 0 Wound Tracing (instead of photographs) X- 1 5 Simple Wound Measurement - one wound '[]'  - 0 Complex Wound Measurement - multiple wounds INTERVENTIONS - Wound Dressings X - Small Wound Dressing one or multiple wounds 1 10 '[]'  - 0 Medium Wound Dressing one or multiple wounds '[]'  - 0 Large Wound Dressing one or multiple wounds '[]'  - 0 Application of Medications - topical '[]'  - 0 Application of Medications - injection INTERVENTIONS - Miscellaneous '[]'  - External ear exam 0 '[]'  - 0 Specimen Collection (cultures, biopsies, blood, body fluids, etc.) '[]'  - 0 Specimen(s) / Culture(s) sent or taken to Lab for analysis '[]'  - 0 Patient Transfer (multiple staff / Civil Service fast streamer / Similar devices) '[]'  - 0 Simple Staple / Suture removal (25 or less) '[]'  - 0 Complex Staple / Suture removal (26 or more) '[]'  - 0 Hypo / Hyperglycemic Management (close monitor of Blood Glucose) '[]'  - 0 Ankle / Brachial Index (ABI) - do not check if billed separately X- 1 5 Vital Signs Has the patient been seen at the hospital within the last three years: Yes Total Score: 75 Level Of Care: New/Established - Level 2 Electronic Signature(s) Signed: 06/13/2022 4:23:03 PM By: Carlene Coria RN Entered By: Carlene Coria on 06/13/2022 15:27:35 [name] ([id number]) -------------------------------------------------------------------------------- Encounter Discharge Information Details Patient Name: [name] Date of Service: 06/13/2022 2:45 PM Medical Record Number: [id number] Patient Account Number: [id number] Date of Birth/Sex: Sep 08, 1930 (88 y.o. F) Treating RN: Carlene Coria Primary Care Chanz Cahall: Fulton Reek Other Clinician: Referring Calistro Rauf: Fulton Reek Treating Rahman Ferrall/Extender: Skipper Cliche in Treatment: 16 Encounter Discharge Information Items Discharge Condition: Stable Ambulatory Status: Wheelchair Discharge Destination: Home Transportation: Private Auto Accompanied By: son Schedule Follow-up Appointment: Yes Clinical Summary of Care: Patient Declined Electronic Signature(s) Signed: 06/13/2022 4:23:03 PM By: Carlene Coria RN Entered By: Carlene Coria on 06/13/2022 15:28:34 [name] ([id number]) -------------------------------------------------------------------------------- Lower Extremity Assessment Details Patient Name: [name] Date of Service: 06/13/2022 2:45 PM Medical Record Number: [id number] Patient Account Number: [id number] Date of Birth/Sex: 1930/09/11 (88 y.o. F) Treating RN: Carlene Coria Primary Care Latisia Hilaire: Fulton Reek Other Clinician: Referring Kristyanna Barcelo: Fulton Reek Treating Jiali Linney/Extender: Skipper Cliche in Treatment: 16 Edema Assessment Assessed: [Left: No] [Right: No] Edema: [Left: Yes] [Right: Yes] Calf Left: Right: Point of Measurement: 32 cm From Medial Instep 33 cm 35 cm Ankle Left: Right: Point of Measurement: 10 cm From Medial Instep 22 cm 20 cm Electronic Signature(s) Signed: 06/13/2022 4:23:03 PM By: Carlene Coria RN Entered By: Carlene Coria on 06/13/2022 15:22:54 [name] ([id number]) -------------------------------------------------------------------------------- Multi Wound Chart Details Patient Name: [name] Date of Service: 06/13/2022 2:45 PM Medical Record Number: [id number] Patient Account Number: [id number] Date of Birth/Sex: 11/23/1930 (88 y.o. F) Treating RN: Carlene Coria Primary Care Munira Polson: Fulton Reek Other Clinician: Referring Ruchy Wildrick: Fulton Reek Treating Alegandro Macnaughton/Extender: Skipper Cliche in Treatment: 16 Vital  Signs Height(in): 60 Pulse(bpm): 94 Weight(lbs): 132 Blood Pressure(mmHg): 136/76 Body Mass Index(BMI): 25.8 Temperature(F): 97.8 Respiratory Rate(breaths/min): 16 Photos: [13:No Photos] [N/A:N/A] Wound Location: [13:Left Lower Leg] [N/A:N/A] Wounding Event: [13:Trauma] [N/A:N/A] Primary Etiology: [13:Skin Tear] [N/A:N/A] Comorbid History: [13:Cataracts, Glaucoma, Optic Neuritis, Chronic sinus problems/congestion, Middle ear problems, Hypertension, Rheumatoid Arthritis] [N/A:N/A] Date Acquired: [13:02/09/2022] [N/A:N/A] Weeks of Treatment: [13:16] [N/A:N/A] Wound Status: [13:Open] [N/A:N/A] Wound Recurrence: [13:No] [N/A:N/A] Measurements L  x W x D (cm) [13:2x1.8x0.1] [N/A:N/A] Area (cm) : [13:2.827] [N/A:N/A] Volume (cm) : [13:0.283] [N/A:N/A] % Reduction in Area: [13:-84.50%] [N/A:N/A] % Reduction in Volume: [13:-85.00%] [N/A:N/A] Classification: [13:Full Thickness Without Exposed Support Structures] [N/A:N/A] Exudate Amount: [13:Medium] [N/A:N/A] Exudate Type: [13:Serosanguineous] [N/A:N/A] Exudate Color: [13:red, brown] [N/A:N/A] Granulation Amount: [13:Large (67-100%)] [N/A:N/A] Necrotic Amount: [13:None Present (0%)] [N/A:N/A] Exposed Structures: [13:Fat Layer (Subcutaneous Tissue): Yes Fascia: No Tendon: No Muscle: No Joint: No Bone: No None] [N/A:N/A N/A] Treatment Notes Electronic Signature(s) Signed: 06/13/2022 4:23:03 PM By: Carlene Coria RN Entered By: Carlene Coria on 06/13/2022 15:23:13 [name] ([id number]) -------------------------------------------------------------------------------- [place] Details Patient Name: [name] Date of Service: 06/13/2022 2:45 PM Medical Record Number: [id number] Patient Account Number: [id number] Date of Birth/Sex: 1929-12-17 (88 y.o. F) Treating RN: Carlene Coria Primary Care Francyne Arreaga: Fulton Reek Other Clinician: Referring Aldwin Micalizzi: Fulton Reek Treating Arnika Larzelere/Extender: Skipper Cliche in Treatment: 16 Active Inactive Wound/Skin Impairment Nursing Diagnoses: Knowledge deficit related to ulceration/compromised skin integrity Goals: Patient/caregiver will verbalize understanding of skin care regimen Date Initiated: 02/17/2022 Date Inactivated: 03/21/2022 Target Resolution Date: 03/20/2022 Goal Status: Met Ulcer/skin breakdown will have a volume reduction of 30% by week 4 Date Initiated: 02/17/2022 Date Inactivated: 03/21/2022 Target Resolution Date: 03/20/2022 Goal Status: Met Ulcer/skin breakdown will have a volume reduction of 50% by week 8 Date Initiated: 02/17/2022 Target Resolution Date: 04/19/2022 Goal Status: Active Ulcer/skin breakdown will have a volume reduction of 80% by week 12 Date Initiated: 02/17/2022 Target Resolution Date: 05/20/2022 Goal Status: Active Ulcer/skin breakdown will heal within 14 weeks Date Initiated: 02/17/2022 Target Resolution Date: 06/19/2022 Goal Status: Active Interventions: Assess patient/caregiver ability to obtain necessary supplies Assess patient/caregiver ability to perform ulcer/skin care regimen upon admission and as needed Assess ulceration(s) every visit Notes: Electronic Signature(s) Signed: 06/13/2022 4:23:03 PM By: Carlene Coria RN Entered By: Carlene Coria on 06/13/2022 15:23:02 [name] ([id number]) -------------------------------------------------------------------------------- Pain Assessment Details Patient Name: [name] Date of Service: 06/13/2022 2:45 PM Medical Record Number: [id number] Patient Account Number: [id number] Date of Birth/Sex: 03-21-1930 (88 y.o. F) Treating RN: Carlene Coria Primary Care Nyari Olsson: Fulton Reek Other Clinician: Referring Ryson Bacha: Fulton Reek Treating Promise Bushong/Extender: Skipper Cliche in Treatment: 16 Active Problems Location of Pain Severity and Description of Pain Patient Has Paino No Site Locations Pain Management and Medication Current Pain  Management: Electronic Signature(s) Signed: 06/13/2022 4:23:03 PM By: Carlene Coria RN Entered By: Carlene Coria on 06/13/2022 15:21:03 [name] ([id number]) -------------------------------------------------------------------------------- Patient/Caregiver Education Details Patient Name: [name] Date of Service: 06/13/2022 2:45 PM Medical Record Number: [id number] Patient Account Number: [id number] Date of Birth/Gender: 10/13/1930 (88 y.o. F) Treating RN: Carlene Coria Primary Care Physician: Fulton Reek Other Clinician: Referring Physician: Fulton Reek Treating Physician/Extender: Skipper Cliche in Treatment: 16 Education Assessment Education Provided To: Patient Education Topics Provided Wound/Skin Impairment: Methods: Explain/Verbal Responses: State content correctly Electronic Signature(s) Signed: 06/13/2022 4:23:03 PM By: Carlene Coria RN Entered By: Carlene Coria on 06/13/2022 15:27:56 [name] ([id number]) -------------------------------------------------------------------------------- Wound Assessment Details Patient Name: [name] Date of Service: 06/13/2022 2:45 PM Medical Record Number: [id number] Patient Account Number: [id number] Date of Birth/Sex: 10/14/1930 (88 y.o. F) Treating RN: Carlene Coria Primary Care Shekera Beavers: Fulton Reek Other Clinician: Referring Kylia Grajales: Fulton Reek Treating Palmira Stickle/Extender: Skipper Cliche in Treatment: 16 Wound Status Wound Number: 13 Primary Skin Tear Etiology: Wound Location: Left Lower Leg Wound Open Wounding Event: Trauma Status: Date Acquired: 02/09/2022 Comorbid Cataracts, Glaucoma, Optic Neuritis, Chronic sinus Weeks  Of Treatment: 16 History: problems/congestion, Middle ear problems, Hypertension, Clustered Wound: No Rheumatoid Arthritis Wound Measurements Length: (cm) 2 Width: (cm) 1.8 Depth: (cm) 0.1 Area: (cm) 2.827 Volume: (cm) 0.283 % Reduction in Area: -84.5% %  Reduction in Volume: -85% Epithelialization: None Tunneling: No Undermining: No Wound Description Classification: Full Thickness Without Exposed Support Structures Exudate Amount: Medium Exudate Type: Serosanguineous Exudate Color: red, brown Foul Odor After Cleansing: No Slough/Fibrino No Wound Bed Granulation Amount: Large (67-100%) Exposed Structure Necrotic Amount: None Present (0%) Fascia Exposed: No Fat Layer (Subcutaneous Tissue) Exposed: Yes Tendon Exposed: No Muscle Exposed: No Joint Exposed: No Bone Exposed: No Treatment Notes Wound #13 (Lower Leg) Wound Laterality: Left Cleanser [place]ram Ancillary Kit - 15 Day Supply Discharge Instruction: Use supplies as instructed; Kit contains: (15) Saline Bullets; (15) 3x3 Gauze; 15 pr Gloves Soap and Water Discharge Instruction: Gently cleanse wound with antibacterial soap, rinse and pat dry prior to dressing wounds Peri-Wound Care Topical Primary Dressing Silvercel Small 2x2 (in/in) Discharge Instruction: Apply Silvercel Small 2x2 (in/in) as instructed Secondary Dressing ABD Pad 5x9 (in/in) Discharge Instruction: Cover with ABD pad Secured With Medipore Tape - 83M Medipore H Soft Cloth Surgical Tape, 2x2 (in/yd) JESSI, PITSTICK VMarland Kitchen ([id number]) Kerlix Roll Sterile or Non-Sterile 6-ply 4.5x4 (yd/yd) Discharge Instruction: Apply Kerlix as directed Compression Wrap Compression Stockings Add-Ons Electronic Signature(s) Signed: 06/13/2022 4:23:03 PM By: Carlene Coria RN Entered By: Carlene Coria on 06/13/2022 15:22:18 [name] ([id number]) -------------------------------------------------------------------------------- Vitals Details Patient Name: [name] Date of Service: 06/13/2022 2:45 PM Medical Record Number: [id number] Patient Account Number: [id number] Date of Birth/Sex: July 10, 1930 (88 y.o. F) Treating RN: Carlene Coria Primary Care Mikesha Migliaccio: Fulton Reek Other Clinician: Referring Disney Ruggiero: Fulton Reek Treating Samrat Hayward/Extender: Skipper Cliche in Treatment: 16 Vital Signs Time Taken: 15:20 Temperature (F): 97.8 Height (in): 60 Pulse (bpm): 61 Weight (lbs): 132 Respiratory Rate (breaths/min): 16 Body Mass Index (BMI): 25.8 Blood Pressure (mmHg): 136/76 Reference Range: 80 - 120 mg / dl Electronic Signature(s) Signed: 06/13/2022 4:23:03 PM By: Carlene Coria RN Entered By: Carlene Coria on 06/13/2022 15:20:53

## 2022-06-21 ENCOUNTER — Encounter: Payer: Medicare Other | Admitting: Physician Assistant

## 2022-06-21 DIAGNOSIS — S81812A Laceration without foreign body, left lower leg, initial encounter: Secondary | ICD-10-CM | POA: Diagnosis not present

## 2022-06-21 NOTE — Progress Notes (Signed)
[name], [name] ([id number]) Visit Report for 06/21/2022 Chief Complaint Document Details Patient Name: [name], [name]. Date of Service: 06/21/2022 11:15 AM Medical Record Number: [id number] Patient Account Number: [id number] Date of Birth/Sex: 1930-04-24 (88 y.o. F) Treating RN: Huel Coventry Primary Care Provider: Aram Beecham Other Clinician: Referring Provider: Aram Beecham Treating Provider/Extender: Rowan Blase in Treatment: 17 Information Obtained from: Patient Chief Complaint Right leg skin tear due to A dog scratch 08/12/2020; patient returns to clinic with a skin tear on her left forearm for our review 02/17/2022; patient presents with multiple open wounds to her lower extremities bilaterally due to trauma. Electronic Signature(s) Signed: 06/21/2022 11:16:49 AM By: Lenda Kelp PA-C Entered By: Lenda Kelp on 06/21/2022 11:16:49 [name] ([id number]) -------------------------------------------------------------------------------- Problem List Details Patient Name: [name] Date of Service: 06/21/2022 11:15 AM Medical Record Number: [id number] Patient Account Number: [id number] Date of Birth/Sex: February 04, 1930 (88 y.o. F) Treating RN: Huel Coventry Primary Care Provider: Aram Beecham Other Clinician: Referring Provider: Aram Beecham Treating Provider/Extender: Rowan Blase in Treatment: 17 Active Problems ICD-10 Encounter Code Description Active Date MDM Diagnosis S81.812A Laceration without foreign body, left lower leg, initial encounter 02/17/2022 No Yes S81.811A Laceration without foreign body, right lower leg, initial encounter 02/17/2022 No Yes L97.812 Non-pressure chronic ulcer of other part of right lower leg with fat layer 02/17/2022 No Yes exposed L97.822 Non-pressure chronic ulcer of other part of left lower leg with fat layer 02/17/2022 No Yes exposed I87.2 Venous insufficiency (chronic) (peripheral) 02/17/2022 No Yes Inactive  Problems Resolved Problems Electronic Signature(s) Signed: 06/21/2022 11:16:45 AM By: Lenda Kelp PA-C Entered By: Lenda Kelp on 06/21/2022 11:16:45

## 2022-06-21 NOTE — Progress Notes (Addendum)
PAHOUA, SCHREINER ([id number]) Visit Report for 06/21/2022 Arrival Information Details Patient Name: [name], [name] Date of Service: 06/21/2022 11:15 AM Medical Record Number: [id number] Patient Account Number: [id number] Date of Birth/Sex: Feb 08, 1930 (88 y.o. F) Treating RN: Cornell Barman Primary Care Chiara Coltrin: Fulton Reek Other Clinician: Referring Carl Bleecker: Fulton Reek Treating Bransen Fassnacht/Extender: Skipper Cliche in Treatment: 54 Visit Information History Since Last Visit Added or deleted any medications: No Patient Arrived: Wheel Chair Has Dressing in Place as Prescribed: Yes Arrival Time: 11:22 Has Compression in Place as Prescribed: Yes Accompanied By: caregiver Pain Present Now: No Transfer Assistance: None Patient Identification Verified: Yes Secondary Verification Process Completed: Yes Patient Requires Transmission-Based Precautions: No Patient Has Alerts: Yes Patient Alerts: NOT diabetic Electronic Signature(s) Signed: 06/21/2022 3:46:53 PM By: Gretta Cool, BSN, RN, CWS, Kim RN, BSN Entered By: Gretta Cool, BSN, RN, CWS, Kim on 06/21/2022 11:24:33 [name] ([id number]) -------------------------------------------------------------------------------- Clinic Level of Care Assessment Details Patient Name: [name] Date of Service: 06/21/2022 11:15 AM Medical Record Number: [id number] Patient Account Number: [id number] Date of Birth/Sex: 05-16-1930 (88 y.o. F) Treating RN: Cornell Barman Primary Care Kara Mierzejewski: Fulton Reek Other Clinician: Referring Julyssa Kyer: Fulton Reek Treating Aniaya Bacha/Extender: Skipper Cliche in Treatment: 17 Clinic Level of Care Assessment Items TOOL 4 Quantity Score _0  - Use when only an EandM is performed on FOLLOW-UP visit 0 ASSESSMENTS - Nursing Assessment / Reassessment X - Reassessment of Co-morbidities (includes updates in patient status) 1 10 X- 1 5 Reassessment of Adherence to Treatment Plan ASSESSMENTS - Wound and Skin  Assessment / Reassessment X - Simple Wound Assessment / Reassessment - one wound 1 5 _1  - 0 Complex Wound Assessment / Reassessment - multiple wounds _2  - 0 Dermatologic / Skin Assessment (not related to wound area) ASSESSMENTS - Focused Assessment _3  - Circumferential Edema Measurements - multi extremities 0 _4  - 0 Nutritional Assessment / Counseling / Intervention _5  - 0 Lower Extremity Assessment (monofilament, tuning fork, pulses) _6  - 0 Peripheral Arterial Disease Assessment (using hand held doppler) ASSESSMENTS - Ostomy and/or Continence Assessment and Care _7  - Incontinence Assessment and Management 0 _8  - 0 Ostomy Care Assessment and Management (repouching, etc.) PROCESS - Coordination of Care X - Simple Patient / Family Education for ongoing care 1 15 _9  - 0 Complex (extensive) Patient / Family Education for ongoing care X- 1 10 Staff obtains Programmer, systems, Records, Test Results / Process Orders _10  - 0 Staff telephones HHA, Nursing Homes / Clarify orders / etc _11  - 0 Routine Transfer to another Facility (non-emergent condition) _12  - 0 Routine Hospital Admission (non-emergent condition) _13  - 0 New Admissions / Biomedical engineer / Ordering NPWT, Apligraf, etc. _14  - 0 Emergency Hospital Admission (emergent condition) X- 1 10 Simple Discharge Coordination _15  - 0 Complex (extensive) Discharge Coordination PROCESS - Special Needs _16  - Pediatric / Minor Patient Management 0 _17  - 0 Isolation Patient Management _18  - 0 Hearing / Language / Visual special needs _19  - 0 Assessment of Community assistance (transportation, D/C planning, etc.) _20  - 0 Additional assistance / Altered mentation _21  - 0 Support Surface(s) Assessment (bed, cushion, seat, etc.) INTERVENTIONS - Wound Cleansing / Measurement ASHLIN, KREPS V. ([id number]) X- 1 5 Simple Wound Cleansing - one wound _22  - 0 Complex Wound Cleansing - multiple wounds X- 1 5 Wound Imaging (photographs - any number  of wounds) _23  - 0 Wound Tracing (instead of photographs) X- 1 5 Simple Wound Measurement - one wound _24  - 0 Complex Wound Measurement - multiple wounds  INTERVENTIONS - Wound Dressings _0  - Small Wound Dressing one or multiple wounds 0 _1  - 0 Medium Wound Dressing one or multiple wounds X- 1 20 Large Wound Dressing one or multiple wounds <BPZWCHENIDPOEUMP>_5<\/TIRWERXVQMGQQPYP>_9  - 0 Application of Medications - topical <JKDTOIZTIWPYKDXI>_3<\/JASNKNLZJQBHALPF>_7  - 0 Application of Medications - injection INTERVENTIONS - Miscellaneous _4  - External ear exam 0 _5  - 0 Specimen Collection (cultures, biopsies, blood, body fluids, etc.) _6  - 0 Specimen(s) / Culture(s) sent or taken to Lab for analysis _7  - 0 Patient Transfer (multiple staff / Civil Service fast streamer / Similar devices) _8  - 0 Simple Staple / Suture removal (25 or less) _9  - 0 Complex Staple / Suture removal (26 or more) _10  - 0 Hypo / Hyperglycemic Management (close monitor of Blood Glucose) _11  - 0 Ankle / Brachial Index (ABI) - do not check if billed separately X- 1 5 Vital Signs Has the patient been seen at the hospital within the last three years: Yes Total Score: 95 Level Of Care: New/Established - Level 3 Electronic Signature(s) Signed: 06/21/2022 3:46:53 PM By: Gretta Cool, BSN, RN, CWS, Kim RN, BSN Entered By: Gretta Cool, BSN, RN, CWS, Kim on 06/21/2022 12:09:03 [name] ([id number]) -------------------------------------------------------------------------------- Encounter Discharge Information Details Patient Name: [name] Date of Service: 06/21/2022 11:15 AM Medical Record Number: [id number] Patient Account Number: [id number] Date of Birth/Sex: Feb 12, 1930 (88 y.o. F) Treating RN: Cornell Barman Primary Care Daksh Coates: Fulton Reek Other Clinician: Referring Shandrika Ambers: Fulton Reek Treating Maddi Collar/Extender: Skipper Cliche in Treatment: 17 Encounter Discharge Information Items Discharge Condition: Stable Ambulatory Status: Ambulatory Discharge Destination: Home Transportation:  Private Auto Accompanied By: self Schedule Follow-up Appointment: Yes Clinical Summary of Care: Electronic Signature(s) Signed: 06/21/2022 12:10:11 PM By: Gretta Cool, BSN, RN, CWS, Kim RN, BSN Entered By: Gretta Cool, BSN, RN, CWS, Kim on 06/21/2022 12:10:11 [name] ([id number]) -------------------------------------------------------------------------------- Lower Extremity Assessment Details Patient Name: EMMALYN, HINSON Date of Service: 06/21/2022 11:15 AM Medical Record Number: [id number] Patient Account Number: [id number] Date of Birth/Sex: 1930-05-03 (88 y.o. F) Treating RN: Cornell Barman Primary Care Jawara Latorre: Fulton Reek Other Clinician: Referring Lillan Mccreadie: Fulton Reek Treating Emilo Gras/Extender: Skipper Cliche in Treatment: 17 Edema Assessment Assessed: [Left: No] Patrice Paradise: No] [Left: Edema] [Right: :] Calf Left: Right: Point of Measurement: 32 cm From Medial Instep 34 cm Ankle Left: Right: Point of Measurement: 10 cm From Medial Instep 22 cm Knee To Floor Left: Right: From Medial Instep 37 cm Vascular Assessment Pulses: Dorsalis Pedis Palpable: [Left:Yes] Electronic Signature(s) Signed: 06/21/2022 3:46:53 PM By: Gretta Cool, BSN, RN, CWS, Kim RN, BSN Entered By: Gretta Cool, BSN, RN, CWS, Kim on 06/21/2022 11:36:46 [name] ([id number]) -------------------------------------------------------------------------------- Multi Wound Chart Details Patient Name: [name] Date of Service: 06/21/2022 11:15 AM Medical Record Number: [id number] Patient Account Number: [id number] Date of Birth/Sex: January 19, 1930 (88 y.o. F) Treating RN: Cornell Barman Primary Care Nykeem Citro: Fulton Reek Other Clinician: Referring Kurt Hoffmeier: Fulton Reek Treating Victorino Fatzinger/Extender: Skipper Cliche in Treatment: 17 Vital Signs Height(in): 60 Pulse(bpm): 56 Weight(lbs): 132 Blood Pressure(mmHg): 129/77 Body Mass Index(BMI): 25.8 Temperature(F): 97.9 Respiratory Rate(breaths/min):  16 Photos: [N/A:N/A] Wound Location: Left Lower Leg N/A N/A Wounding Event: Trauma N/A N/A Primary Etiology: Skin Tear N/A N/A Comorbid History: Cataracts, Glaucoma, Optic N/A N/A Neuritis, Chronic sinus problems/congestion, Middle ear problems, Hypertension, Rheumatoid Arthritis Date Acquired: 02/09/2022 N/A N/A Weeks of Treatment: 17 N/A N/A Wound Status: Open N/A N/A Wound Recurrence: No N/A N/A Measurements L x W x D (cm) 5x7x0.1 N/A N/A Area (cm) : 27.489 N/A N/A Volume (cm) :  2.749 N/A N/A % Reduction in Area: -1694.30% N/A N/A % Reduction in Volume: -1696.70% N/A N/A Classification: Full Thickness Without Exposed N/A N/A Support Structures Exudate Amount: Medium N/A N/A Exudate Type: Serosanguineous N/A N/A Exudate Color: red, brown N/A N/A Granulation Amount: Medium (34-66%) N/A N/A Granulation Quality: Red N/A N/A Necrotic Amount: Medium (34-66%) N/A N/A Exposed Structures: Fat Layer (Subcutaneous Tissue): N/A N/A Yes Fascia: No Tendon: No Muscle: No Joint: No Bone: No Epithelialization: None N/A N/A Treatment Notes Electronic Signature(s) Signed: 06/21/2022 3:46:53 PM By: Gretta Cool, BSN, RN, CWS, Kim RN, BSN Entered By: Gretta Cool, BSN, RN, CWS, Kim on 06/21/2022 11:48:13 [name] ([id number]) VERBENA, BOEDING ([id number]) -------------------------------------------------------------------------------- [place] Details Patient Name: ICHELLE, HARRAL. Date of Service: 06/21/2022 11:15 AM Medical Record Number: [id number] Patient Account Number: [id number] Date of Birth/Sex: 1930-02-12 (88 y.o. F) Treating RN: Cornell Barman Primary Care Yevonne Yokum: Fulton Reek Other Clinician: Referring Clotiel Troop: Fulton Reek Treating Jadore Mcguffin/Extender: Skipper Cliche in Treatment: 17 Active Inactive Necrotic Tissue Nursing Diagnoses: Impaired tissue integrity related to necrotic/devitalized tissue Knowledge deficit related to management of  necrotic/devitalized tissue Goals: Necrotic/devitalized tissue will be minimized in the wound bed Date Initiated: 06/21/2022 Target Resolution Date: 06/21/2022 Goal Status: Active Patient/caregiver will verbalize understanding of reason and process for debridement of necrotic tissue Date Initiated: 06/21/2022 Target Resolution Date: 06/21/2022 Goal Status: Active Interventions: Assess patient pain level pre-, during and post procedure and prior to discharge Provide education on necrotic tissue and debridement process Treatment Activities: Excisional debridement : 06/21/2022 Notes: Wound/Skin Impairment Nursing Diagnoses: Knowledge deficit related to ulceration/compromised skin integrity Goals: Patient/caregiver will verbalize understanding of skin care regimen Date Initiated: 02/17/2022 Date Inactivated: 03/21/2022 Target Resolution Date: 03/20/2022 Goal Status: Met Ulcer/skin breakdown will have a volume reduction of 30% by week 4 Date Initiated: 02/17/2022 Date Inactivated: 03/21/2022 Target Resolution Date: 03/20/2022 Goal Status: Met Ulcer/skin breakdown will have a volume reduction of 50% by week 8 Date Initiated: 02/17/2022 Target Resolution Date: 04/19/2022 Goal Status: Active Ulcer/skin breakdown will have a volume reduction of 80% by week 12 Date Initiated: 02/17/2022 Target Resolution Date: 05/20/2022 Goal Status: Active Ulcer/skin breakdown will heal within 14 weeks Date Initiated: 02/17/2022 Target Resolution Date: 06/19/2022 Goal Status: Active Interventions: Assess patient/caregiver ability to obtain necessary supplies Assess patient/caregiver ability to perform ulcer/skin care regimen upon admission and as needed Assess ulceration(s) every visit Notes: JADAN, ROUILLARD ([id number]) Electronic Signature(s) Signed: 06/21/2022 3:46:53 PM By: Gretta Cool, BSN, RN, CWS, Kim RN, BSN Entered By: Gretta Cool, BSN, RN, CWS, Kim on 06/21/2022 11:37:14 [name]  ([id number]) -------------------------------------------------------------------------------- Pain Assessment Details Patient Name: [name] Date of Service: 06/21/2022 11:15 AM Medical Record Number: [id number] Patient Account Number: [id number] Date of Birth/Sex: 19-May-1930 (88 y.o. F) Treating RN: Cornell Barman Primary Care Dayle Sherpa: Fulton Reek Other Clinician: Referring Cherity Blickenstaff: Fulton Reek Treating Elisavet Buehrer/Extender: Skipper Cliche in Treatment: 17 Active Problems Location of Pain Severity and Description of Pain Patient Has Paino No Site Locations Pain Management and Medication Current Pain Management: Electronic Signature(s) Signed: 06/21/2022 3:46:53 PM By: Gretta Cool, BSN, RN, CWS, Kim RN, BSN Entered By: Gretta Cool, BSN, RN, CWS, Kim on 06/21/2022 11:27:09 [name] ([id number]) -------------------------------------------------------------------------------- Patient/Caregiver Education Details Patient Name: [name] Date of Service: 06/21/2022 11:15 AM Medical Record Number: [id number] Patient Account Number: [id number] Date of Birth/Gender: December 24, 1929 (88 y.o. F) Treating RN: Cornell Barman Primary Care Physician: Fulton Reek Other Clinician: Referring Physician: Fulton Reek Treating Physician/Extender: Skipper Cliche in Treatment: 17 Education  Assessment Education Provided To: Patient Education Topics Provided Wound Debridement: Handouts: Wound Debridement Methods: Demonstration, Explain/Verbal Responses: State content correctly Wound/Skin Impairment: Handouts: Caring for Your Ulcer Methods: Demonstration, Explain/Verbal Responses: State content correctly Electronic Signature(s) Signed: 06/21/2022 3:46:53 PM By: Gretta Cool, BSN, RN, CWS, Kim RN, BSN Entered By: Gretta Cool, BSN, RN, CWS, Kim on 06/21/2022 12:09:26 [name] ([id number]) -------------------------------------------------------------------------------- Wound Assessment  Details Patient Name: [name] Date of Service: 06/21/2022 11:15 AM Medical Record Number: [id number] Patient Account Number: [id number] Date of Birth/Sex: 25-Dec-1929 (88 y.o. F) Treating RN: Cornell Barman Primary Care Lavanya Roa: Fulton Reek Other Clinician: Referring Marda Breidenbach: Fulton Reek Treating Kaneshia Cater/Extender: Skipper Cliche in Treatment: 17 Wound Status Wound Number: 13 Primary Skin Tear Etiology: Wound Location: Left Lower Leg Wound Open Wounding Event: Trauma Status: Date Acquired: 02/09/2022 Comorbid Cataracts, Glaucoma, Optic Neuritis, Chronic sinus Weeks Of Treatment: 17 History: problems/congestion, Middle ear problems, Hypertension, Clustered Wound: No Rheumatoid Arthritis Photos Wound Measurements Length: (cm) 5 Width: (cm) 7 Depth: (cm) 0.1 Area: (cm) 27.489 Volume: (cm) 2.749 % Reduction in Area: -1694.3% % Reduction in Volume: -1696.7% Epithelialization: None Wound Description Classification: Full Thickness Without Exposed Support Structures Exudate Amount: Medium Exudate Type: Serosanguineous Exudate Color: red, brown Foul Odor After Cleansing: No Slough/Fibrino No Wound Bed Granulation Amount: Medium (34-66%) Exposed Structure Granulation Quality: Red Fascia Exposed: No Necrotic Amount: Medium (34-66%) Fat Layer (Subcutaneous Tissue) Exposed: Yes Necrotic Quality: Adherent Slough Tendon Exposed: No Muscle Exposed: No Joint Exposed: No Bone Exposed: No Treatment Notes Wound #13 (Lower Leg) Wound Laterality: Left Cleanser Soap and Water Discharge Instruction: Gently cleanse wound with antibacterial soap, rinse and pat dry prior to dressing wounds Peri-Wound Care SAIGE, CANTON ([id number]) Topical Primary Dressing Silvercel 4 1/4x 4 1/4 (in/in) Discharge Instruction: Apply Silvercel 4 1/4x 4 1/4 (in/in) as instructed Secondary Dressing ABD Pad 5x9 (in/in) Discharge Instruction: Cover with ABD pad Secured With Medipore  Tape - 17M Medipore H Soft Cloth Surgical Tape, 2x2 (in/yd) Kerlix Roll Sterile or Non-Sterile 6-ply 4.5x4 (yd/yd) Discharge Instruction: Apply Kerlix as directed Compression Wrap Compression Stockings Add-Ons Electronic Signature(s) Signed: 06/21/2022 3:46:53 PM By: Gretta Cool, BSN, RN, CWS, Kim RN, BSN Entered By: Gretta Cool, BSN, RN, CWS, Kim on 06/21/2022 11:34:26 [name] ([id number]) -------------------------------------------------------------------------------- [place] Details Patient Name: [name] Date of Service: 06/21/2022 11:15 AM Medical Record Number: [id number] Patient Account Number: [id number] Date of Birth/Sex: 1930-07-26 (88 y.o. F) Treating RN: Cornell Barman Primary Care Evona Westra: Fulton Reek Other Clinician: Referring Cherina Dhillon: Fulton Reek Treating Harjot Zavadil/Extender: Skipper Cliche in Treatment: 17 Vital Signs Time Taken: 11:24 Temperature (F): 97.9 Height (in): 60 Pulse (bpm): 73 Weight (lbs): 132 Respiratory Rate (breaths/min): 16 Body Mass Index (BMI): 25.8 Blood Pressure (mmHg): 129/77 Reference Range: 80 - 120 mg / dl Electronic Signature(s) Signed: 06/21/2022 3:46:53 PM By: Gretta Cool, BSN, RN, CWS, Kim RN, BSN Entered By: Gretta Cool, BSN, RN, CWS, Kim on 06/21/2022 11:26:34

## 2022-06-30 ENCOUNTER — Other Ambulatory Visit
Admission: RE | Admit: 2022-06-30 | Discharge: 2022-06-30 | Disposition: A | Discharge status: Home / Self Care | Payer: Medicare Other | Source: Ambulatory Visit | Attending: Physician Assistant | Admitting: Physician Assistant

## 2022-06-30 ENCOUNTER — Encounter: Payer: Medicare Other | Admitting: Physician Assistant

## 2022-06-30 DIAGNOSIS — B999 Unspecified infectious disease: Secondary | ICD-10-CM | POA: Insufficient documentation

## 2022-06-30 DIAGNOSIS — S81812A Laceration without foreign body, left lower leg, initial encounter: Secondary | ICD-10-CM | POA: Diagnosis not present

## 2022-06-30 NOTE — Progress Notes (Addendum)
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 can be of benefit. Fortunately there is no signs of active infection at this point. 07/18/19 upon evaluation today patient appears to be doing better with regard to her ulcer on her right lower extremity. She's been tolerating the dressing changes without complication. The good news is she seems to be making excellent progress. Overall I'm pleased with there being no signs of infection. With that being said she does tell me that she's been having some discomfort with the wrap she's unsure of exactly what about the rafters causing her trouble. 07/25/2019 on evaluation today patient appears to be doing a little better in regard to her lower extremity ulcer. Unfortunately the alginate seems to be getting really stuck to the wound bed and surrounding periwound. Fortunately there is no  signs of active infection at this time. There does not appear to be any evidence of infection currently. 08/01/2019 on evaluation today patient actually appears to be doing much better with regard to her right lower extremity ulcer. She has been tolerating the dressing changes without complication. Fortunately her wound does not show any signs of infection and seems to be making good progress. No fevers, chills, nausea, vomiting, or  diarrhea. 08/08/2019 on evaluation today patient actually appears to be doing better with regard to her leg ulcer. She has been tolerating the dressing changes without complication. With that being said I think we may want to switch the dressing up today just based on the appearance of the wound bed in general. Fortunately there is no evidence of infection currently. Her pain seems to be much better. Her son is present with her [name], [name] ([id number]) today. 08/15/2019 on evaluation today patient appears to be doing very well with regard to her right lower extremity ulcer. She still complains about feeling like the compression stocking/Tubigrip is too tight for her. Nonetheless I still think this is beneficial and is helping the wound to heal more effectively. Overall I am extremely pleased at this time with what I am seeing. 08/22/2019 on evaluation today patient actually appears to be doing quite well with regard to her right lower extremity. She has been tolerating the dressing changes without complication. Fortunately there is no signs of active infection at this time. She still complains about the Tubigrip but nonetheless I think this is something that is of utmost importance for her to continue to wear if she is going to see this area heal appropriately. 08/29/2019 on evaluation today patient actually appears to be doing well visually in regard to her wound. Unfortunately in regard to overall pain she seems to be having more pain today which I am somewhat concerned about. There does not appear to be any signs of active infection that I can tell but again with increased pain that is definitely a concern here today. I do think it may be time to switch up the dressing as well to something that will be a little bit more effective hopefully and new tissue growth she has done well with collagen in the past. 09/05/2019 on evaluation today patient actually appears to be doing well with regard to her leg  ulcer. She has been tolerating the dressing changes without complication. Fortunately there is no signs of active infection. We did obtain a wound culture last week due to the fact that she still is very touchy as far as the surface of the wound is concerned. Unfortunately I am not sure what happened to that culture as there is no record in the computer system. Nonetheless we need to follow-up on this and try to see if we can identify what exactly happened with this specimen as we have not gotten a result back either obviously. 09/12/2019 on evaluation today patient actually appears to be showing signs of good improvement upon evaluation today. She states her pain is not nearly as bad as what it was which is also good news. She did have a wound culture which was reviewed today that showed no growth of bacteria which is good news. This coupled with the fact that her wound appears to be healing better, measuring smaller, and overall even the slough buildup is minimal compared to what we have seen previously, I feel like she is actually showing signs of excellent improvement today. 09/19/2019 on evaluation  today patient appears to be doing well with regard to her wounds on evaluation today. She is showing signs of continued improvement week by week and everything is measuring significantly smaller which is great news. Overall very pleased with the progress that she has made. 09/26/2019 on evaluation today patient appears to be doing fairly well with regard to her right lower extremity ulcer. She does continue to have some drainage and again the smaller of the 2 wounds is not making as much progress as I like to see. We have been doing the silver collagen for some time I think we may want to switch to [facility] she has a little bit of hyper granular tissue this may help to keep things from becoming too dramatic in that regard. Fortunately there is no signs of infection at this time. 10/03/2019 on  evaluation today patient appears to be doing fairly well with regard to her lower extremity ulcers at this time. She has been tolerating the dressing changes without complication. Fortunately the wound is measuring smaller which is great news she continues to make great progress. 10/17/2019 on evaluation today patient appears to be doing well with regard to her wounds of her right lower extremity. She states she has been having some increased pain apparently we gave her a little bit smaller size Tubigrip last time she was here this may have been what was causing some issues. She is out of the Tubigrip that we had ordered for her and therefore did not have any of the smaller size. Fortunately there is no evidence of active infection at this time. No fever chills noted 10/24/2019 upon evaluation today patient's wounds actually appear to be showing signs of good improvement which is excellent news. She has been tolerating the dressing changes without complication and overall I am rather pleased at this point with how things seem to be progressing. The patient likewise is very happy. She still was not happy with having to use the Tubigrip she states that bunches up in her shoe. 10/31/2019 on evaluation today patient actually appears to be doing quite well with regard to her leg ulcer. She has been tolerating the dressing changes without complication. Fortunately there is no signs of active infection at this time. Overall I am very pleased with the progress that she is made. She does wonder if we could switch to the bordered adhesive Hydrofera Blue I think that something we can definitely look into for her. 11/15/2019 patient on evaluation today appears to be making excellent progress. Her wound is significantly smaller and the wound bed appears to be dramatically improved compared to even the last visit. Fortunately she is having really little to no pain at this time. No fevers, chills, nausea, vomiting, or  diarrhea. 11/25/2019 on evaluation today patient appears to be doing well with regard to her lower extremity ulcer. She has been tolerating dressing changes without complication. Fortunately there is no signs of active infection at this time. No fevers, chills, nausea, vomiting, or diarrhea. 12/02/2019 on evaluation today patient unfortunately is not doing quite as well as what we would have liked to have seen based on last week's evaluation. Fortunately there is no signs of active infection at this time. With that being said she is continuing to experience some issues with hyper granulation she was doing much better with the [facility] prior to putting the contact layer in between her skin and the [facility] I think that we need to go back to that.  She just can have to be very careful with removing the dressing in order to ensure it does not pull skin and get stuck. 12/16/2019 on evaluation today patient appears to be doing better her wound is measuring significantly smaller at this time compared to where things were previous. Fortunately there is no evidence of active infection at this point which is also good news. No fever chills noted. 12/23/2019 upon evaluation today patient appears to be doing excellent in regard to her lower extremity ulcer. She has been tolerating the dressing changes without complication. Fortunately there is no signs of active infection at this time. Overall I am very pleased with the fact that she is doing so well today. READMISSION 08/12/2020 Mrs. Joshi is a now 88 year old woman we have had in this clinic on at least 2 occasions. She spent a protracted period of time here for a postsurgical wound on her left olecranon and then more recently towards the end of last year earlier this year was in for a skin tear on her left lower leg caused by her dog. She comes in today with a reasonably acute area on her left forearm again apparently caused by her dog although  it was not a bite injury. She was seen in urgent care and they managed to reoppose a lot of the skin tear although the patient states that is now reopened slightly. They have been using Neosporin. She has very fragile skin probably a combination of chronic steroid use for rheumatoid [name], [name] ([id number]) arthritis [although I do not see that she is on that now] solar skin damage. I note that she takes methotrexate and Plaquenil for her rheumatoid arthritis 9/15; this patient had a skin tear on her left forearm from trauma with her dog. She was seen in urgent care and they reapposed most of the separated skin. Fortunately for her most of this is remained intact and the skin above and below the wound actually looks healthy. Where there was a separation and the 2 flaps of skin there is still an open area however the granulation tissue looks healthy here. We have been using Xeroform however I had like to change to [facility] 9/29; skin tear on her left arm from a dog scratch. The skin was reapposed in an urgent care and surprisingly stayed viable. We used Hydrofera Blue last time the wound is smaller 10/13; patient comes back to clinic after 2 weeks for a left arm wound secondary to a dog scratch. The skin was reapposed and in urgent care. We have been using Hydrofera Blue and the wound is closed today. Readmissiono3/08/2022 Ms. Khalise Billard is an 88 year old female with a past medical history of chronic venous insufficiency and lymphedema, COPD, rheumatoid arthritis and hypertension that presents with a 2-week history of wounds to her lower extremities bilaterally. She states she had a skin tag to the right posterior leg that came off and became a wound. She states the area has slowly gotten larger. She was seen by her primary care physician's office on 02/15/2022 for this issue. She was prescribed Bactroban and cefdinir. She has 2 other wounds 1 located to each leg. These were from trauma.  She states one was from trying to put on compression stockings. Her ABIs in office on the left was 0.93 and 1.0 on the right. She currently denies systemic signs of infection. 02/24/2022 upon evaluation today patient appears to be doing well currently in regard to her skin tears. In general I feel  like she is making good progress. Fortunately I do not see any evidence of active infection locally or systemically which is great news. No fevers, chills, nausea, vomiting, or diarrhea. 03/04/2022 upon evaluation patient actually appears to be doing well in regard to the wounds on her legs. She has been tolerating the dressing changes without complication. Overall I think all are showing signs of improvement which is great news. 03/10/2022 upon evaluation today patient appears to be doing better in regard to her wounds in general. I am very pleased with where things stand. I do believe that she is showing signs of good improvement which is great news. No fevers, chills, nausea, vomiting, or diarrhea. 03-21-2022 upon evaluation today patient appears to be doing well with regard to her wound on the left leg and the anterior right leg both are showing signs of significant improvement. Unfortunately she has a lot of slough and biofilm buildup on the surface of the posterior wound which is not good. I do not see any signs of active infection locally or systemically which is great news. No fevers, chills, nausea, vomiting, or diarrhea. 03-29-2022 upon evaluation today patient appears to be doing well currently in regard to her wounds. She has been tolerating the dressing changes without complication. Fortunately there does not appear to be any signs of active infection at this time which is great news. No fevers, chills, nausea, vomiting, or diarrhea. 4/27; right posterior calf and left lateral calf both initially traumatic in the setting of significant venous insufficiency. She did not tolerate compression wraps  apparently because she could not get shoes on her feet and therefore we have been using Tubigrip's and silver alginate 5/8; right posterior calf 100% covered with adherent debris. The area on the left lateral really looks quite good under illumination islands of new epithelialization. We have been using Xeroform on the left Iodoflex on the right bilateral 2 layer Tubigrip's 04-28-2022 upon evaluation today patient appears to be doing well with regard to her wound she has been tolerating the dressing changes which is great news. Overall I feel like the Xeroform is doing well on the left leg this is pretty much healed on the right leg she is very close to being healed as well I think we can probably switch over to Xeroform here as well. 05-05-2022 upon evaluation today patient appears to be doing excellent in regard to her wounds both are showing signs of being very close to complete resolution which is awesome news. Fortunately I do not see any signs of active infection at this time which is great news and in general I think we are headed in the right direction. 05-13-2022 upon evaluation today patient appears to be doing well with regard to her wounds. She however does have some breakdown due to the fact that I think is staying too moist on the left lateral aspect. The right is also staying a little bit too moist in my opinion. Subsequently I think that we may want to switch the dressings currently. I would recommend switching over to a silver cell dressing. 6/13; apparently the patient was felt to be having home health by [facility] therefore she did not receive supplies for her wound care. Current dressing remained on for for 5 days. Removed today by her intake nurse with purulent drainage and the left lateral lower leg is considerably larger also slightly larger on the right. They are changing this usually every second day. At this point I am not really sure  why the patient flagged as having home  health 05-31-2022 upon evaluation today patient appears to be doing well with regard to her wounds in general. Fortunately I do not see any signs of active infection at this time systemically though locally she did have an infection last week and she is doing some better but was only able to take 3 days total of her medication. I will go ahead and get her sent in a prescription for doxycycline because the Bactrim was causing her stomach upset and I think probably the treatment matter is that she will benefit from at least 7 to 10 days of this doxycycline to make sure the infection is completely gone. 06-07-2022 upon evaluation today patient appears to be doing better. Her right leg is completely healed which is great news. The left leg is improving though not completely healed I think we are on the right track here still and I am very pleased with where things stand at this point. There does not appear to be any sign of infection at all at this time. 06-13-2022 upon evaluation today patient appears to be doing well with regard to her wound in fact this is showing signs of excellent improvement which is great news. Fortunately there does not appear to be any evidence of active infection locally or systemically at this time. No fevers, chills, nausea, vomiting, or diarrhea. 06-21-2022 upon evaluation today patient's wound on the right leg is still doing well the left leg had a lot of dry skin trapping fluid up underneath. Fortunately there does not appear to be any evidence of infection locally or systemically at this time which is great news and overall very pleased in that regard. Nonetheless I do believe that we need to try to get some of this away so that the alginate can collect the drainage. 06-30-2022 upon evaluation patient continues to have a wound that just seems to continue to morph and change as we progress here. I do not see any signs of infection systemically though locally I am seeing some  issues here with erythema and minimal warmth I think that she could benefit CICLALY, MULCAHEY. ([id number]) from topical antibiotic therapy possibly with Jodie Echevaria initially I will probably use gentamicin while we obtain a culture and ensure we know what exactly we are dealing with here. Electronic Signature(s) Signed: 06/30/2022 4:46:22 PM By: Lenda Kelp PA-C Entered By: Lenda Kelp on 06/30/2022 16:46:22 [name] ([id number]) -------------------------------------------------------------------------------- Physical Exam Details Patient Name: [name] Date of Service: 06/30/2022 2:15 PM Medical Record Number: [id number] Patient Account Number: [id number] Date of Birth/Sex: Nov 16, 1930 (88 y.o. F) Treating RN: Yevonne Pax Primary Care Provider: Aram Beecham Other Clinician: Betha Loa Referring Provider: Aram Beecham Treating Provider/Extender: Rowan Blase in Treatment: 38 Constitutional Well-nourished and well-hydrated in no acute distress. Respiratory normal breathing without difficulty. Psychiatric this patient is able to make decisions and demonstrates good insight into disease process. Alert and Oriented x 3. pleasant and cooperative. Notes Upon inspection patient's wound bed actually showed signs of good granulation and epithelization at this point. Fortunately I do not see any signs of infection systemically which is great news. With that being said locally that is a different story. We do need to try to get this sealed up. Electronic Signature(s) Signed: 06/30/2022 4:46:48 PM By: Lenda Kelp PA-C Entered By: Lenda Kelp on 06/30/2022 16:46:48 [name] ([id number]) -------------------------------------------------------------------------------- Physician Orders Details Patient Name: [name] Date of Service: 06/30/2022  2:15 PM Medical Record Number: [id number] Patient Account Number: [id number] Date of Birth/Sex:  1930-02-24 (88 y.o. F) Treating RN: Yevonne Pax Primary Care Provider: Aram Beecham Other Clinician: Betha Loa Referring Provider: Aram Beecham Treating Provider/Extender: Rowan Blase in Treatment: 27 Verbal / Phone Orders: No Diagnosis Coding ICD-10 Coding Code Description [phone] Laceration without foreign body, left lower leg, initial encounter S81.811A Laceration without foreign body, right lower leg, initial encounter L97.812 Non-pressure chronic ulcer of other part of right lower leg with fat layer exposed L97.822 Non-pressure chronic ulcer of other part of left lower leg with fat layer exposed I87.2 Venous insufficiency (chronic) (peripheral) Follow-up Appointments o Return Appointment in 1 week. Bathing/ Shower/ Hygiene o May shower; gently cleanse wound with antibacterial soap, rinse and pat dry prior to dressing wounds o No tub bath. Anesthetic (Use 'Patient Medications' Section for Anesthetic Order Entry) o Lidocaine applied to wound bed Edema Control - Lymphedema / Segmental Compressive Device / Other o Tubigrip double layer applied - size c o Elevate, Exercise Daily and Avoid Standing for Long Periods of Time. o Elevate legs to the level of the heart and pump ankles as often as possible o Elevate leg(s) parallel to the floor when sitting. Off-Loading o Turn and reposition every 2 hours - recommend prop legs/heels so wounds do not touch the bed/chair Additional Orders / Instructions o Follow Nutritious Diet and Increase Protein Intake Wound Treatment Wound #13 - Lower Leg Wound Laterality: Left Cleanser: Soap and Water Every Other Day/30 Days Discharge Instructions: Gently cleanse wound with antibacterial soap, rinse and pat dry prior to dressing wounds Topical: Gentamicin Every Other Day/30 Days Discharge Instructions: Apply thin layer to wound as directed by provider. Primary Dressing: Cutimed Sorbact 1.5x 2.38 (in/in) (DME)  (Generic) Every Other Day/30 Days Discharge Instructions: A bacteria- and fungi binding wound dressing, suitable for cavities and fistulas. It is suitable as a wound filler and allows the passage of wound exudate into a secondary dressing. The dressing helps reducing odor and pain and can improve healing. Secondary Dressing: Zetuvit Plus 4x8 (in/in) (DME) (Generic) Every Other Day/30 Days Laboratory o Bacteria identified in Wound by Culture (MICRO) - Culture obtained left lower leg oooo LOINC Code: 6462-6 oooo Convenience Name: Wound culture routine [name], [name] ([id number]) Patient Medications Allergies: Penicillins, Sporanox, adhesive, codeine, Cipro, azithromycin, bioxin, doxycycline Notifications Medication Indication Start End gentamicin 06/30/2022 DOSE topical 0.1 % cream - cream topical applied in a thin film to the wound bed 3 times per week with each dressing change x 30 days Electronic Signature(s) Signed: 06/30/2022 4:49:57 PM By: Lenda Kelp PA-C Entered By: Lenda Kelp on 06/30/2022 16:49:56 [name] ([id number]) -------------------------------------------------------------------------------- Problem List Details Patient Name: [name] Date of Service: 06/30/2022 2:15 PM Medical Record Number: [id number] Patient Account Number: [id number] Date of Birth/Sex: 1930-11-18 (88 y.o. F) Treating RN: Yevonne Pax Primary Care Provider: Aram Beecham Other Clinician: Betha Loa Referring Provider: Aram Beecham Treating Provider/Extender: Rowan Blase in Treatment: 19 Active Problems ICD-10 Encounter Code Description Active Date MDM Diagnosis S81.812A Laceration without foreign body, left lower leg, initial encounter 02/17/2022 No Yes S81.811A Laceration without foreign body, right lower leg, initial encounter 02/17/2022 No Yes L97.812 Non-pressure chronic ulcer of other part of right lower leg with fat layer 02/17/2022 No Yes exposed L97.822  Non-pressure chronic ulcer of other part of left lower leg with fat layer 02/17/2022 No Yes exposed I87.2 Venous insufficiency (chronic) (peripheral) 02/17/2022 No Yes Inactive Problems Resolved Problems  Electronic Signature(s) Signed: 06/30/2022 2:27:04 PM By: Lenda Kelp PA-C Entered By: Lenda Kelp on 06/30/2022 14:27:04 [name] ([id number]) -------------------------------------------------------------------------------- Progress Note Details Patient Name: [name] Date of Service: 06/30/2022 2:15 PM Medical Record Number: [id number] Patient Account Number: [id number] Date of Birth/Sex: [birth year] (88 y.o. F) Treating RN: Yevonne Pax Primary Care Provider: Aram Beecham Other Clinician: Betha Loa Referring Provider: Aram Beecham Treating Provider/Extender: Rowan Blase in Treatment: 19 Subjective Chief Complaint Information obtained from Patient Right leg skin tear due to A dog scratch 08/12/2020; patient returns to clinic with a skin tear on her left forearm for our review 02/17/2022; patient presents with multiple open wounds to her lower extremities bilaterally due to trauma. History of Present Illness (HPI) 88 year old patient who is looking much younger than his stated age comes in with a history of having a laceration to her left lower extremity which she sustained about a week ago. She has several medical comorbidities including degenerative arthritis, scoliosis, history of back surgery, pacemaker placement,AMA positive, ulnar neuropathy and left carpal tunnel syndrome. she is also had sclerotherapy for varicose veins in May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 stocking/Tubigrip is too tight for her. Nonetheless I still think this is beneficial and is helping the wound to heal more effectively. Overall I am extremely pleased at this time with what I am seeing. 08/22/2019 on evaluation today patient actually appears to be doing quite well with regard to her right lower extremity. She has been tolerating the dressing changes without complication. Fortunately there is no signs of active infection at this time. She still complains about the Tubigrip but nonetheless I think this is something that is of utmost importance for her to continue to wear if she is going to see this area heal appropriately. 08/29/2019 on evaluation today patient actually appears to be doing well visually in regard to her wound. Unfortunately in regard to overall pain she seems to be having more pain today which I am somewhat concerned about. There does not appear to be any signs of active infection that I can tell but again with increased pain that is definitely a concern here today. I do think it may be time to switch up the dressing as well to something that will be a little bit more effective hopefully and new tissue growth she has done well with collagen in the past. 09/05/2019 on evaluation today patient actually appears to be doing well with regard to her leg ulcer. She has been tolerating the dressing changes without complication. Fortunately there is no signs of active infection. We did obtain a wound culture last week due to the fact that she still is very touchy as far as the surface of the  wound is concerned. Unfortunately I am not sure what happened to that culture as there is no record in the computer system. Nonetheless we need to follow-up on this and try to see if we can identify what exactly happened with this specimen as we have not gotten a result back either obviously. 09/12/2019 on evaluation today patient actually appears to be showing signs of good improvement upon evaluation today. She states her pain is not nearly as bad as what it was which is also good news. She did have a wound culture which was reviewed today that showed no growth of bacteria which is good news. This coupled with the fact that her wound appears to be healing better, measuring smaller, and overall even the slough buildup is minimal compared to what we have seen previously, I feel like she is actually showing signs of excellent improvement today. 09/19/2019 on evaluation today patient appears to be doing well with regard to her wounds on evaluation today. She is showing signs of continued improvement week by week and everything is measuring significantly smaller which is great news. Overall very pleased with the progress that she has made. 09/26/2019 on evaluation today patient appears to be doing fairly well with regard to her right lower  extremity ulcer. She does continue to have some drainage and again the smaller of the 2 wounds is not making as much progress as I like to see. We have been doing the silver collagen for some time I think we may want to switch to [facility] she has a little bit of hyper granular tissue this may help to keep things from becoming too dramatic in that regard. Fortunately there is no signs of infection at this time. 10/03/2019 on evaluation today patient appears to be doing fairly well with regard to her lower extremity ulcers at this time. She has been tolerating the dressing changes without complication. Fortunately the wound is measuring smaller which is great news  she continues to make great progress. 10/17/2019 on evaluation today patient appears to be doing well with regard to her wounds of her right lower extremity. She states she has been having some increased pain apparently we gave her a little bit smaller size Tubigrip last time she was here this may have been what was causing some issues. She is out of the Tubigrip that we had ordered for her and therefore did not have any of the smaller size. Fortunately there is no evidence of active infection at this time. No fever chills noted 10/24/2019 upon evaluation today patient's wounds actually appear to be showing signs of good improvement which is excellent news. She has been tolerating the dressing changes without complication and overall I am rather pleased at this point with how things seem to be progressing. The patient likewise is very happy. She still was not happy with having to use the Tubigrip she states that bunches up in her shoe. 10/31/2019 on evaluation today patient actually appears to be doing quite well with regard to her leg ulcer. She has been tolerating the dressing changes without complication. Fortunately there is no signs of active infection at this time. Overall I am very pleased with the progress that she is made. She does wonder if we could switch to the bordered adhesive Hydrofera Blue I think that something we can definitely look into for her. 11/15/2019 patient on evaluation today appears to be making excellent progress. Her wound is significantly smaller and the wound bed appears to be dramatically improved compared to even the last visit. Fortunately she is having really little to no pain at this time. No fevers, chills, nausea, vomiting, or diarrhea. 11/25/2019 on evaluation today patient appears to be doing well with regard to her lower extremity ulcer. She has been tolerating dressing changes without complication. Fortunately there is no signs of active infection at this  time. No fevers, chills, nausea, vomiting, or diarrhea. 12/02/2019 on evaluation today patient unfortunately is not doing quite as well as what we would have liked to have seen based on last week's evaluation. Fortunately there is no signs of active infection at this time. With that being said she is continuing to experience some issues with hyper granulation she was doing much better with the [facility] prior to putting the contact layer in between her skin and the [facility] I think that we need to go back to that. She just can have to be very careful with removing the dressing in order to ensure it does not pull skin and get stuck. 12/16/2019 on evaluation today patient appears to be doing better her wound is measuring significantly smaller at this time compared to where things were previous. Fortunately there is no evidence of active infection at this point which is  also good news. No fever chills noted. 12/23/2019 upon evaluation today patient appears to be doing excellent in regard to her lower extremity ulcer. She has been tolerating the dressing changes without complication. Fortunately there is no signs of active infection at this time. Overall I am very pleased with the fact that she is doing so well today. READMISSION 08/12/2020 [name] ([id number]) Mrs. Withington is a now 88 year old woman we have had in this clinic on at least 2 occasions. She spent a protracted period of time here for a postsurgical wound on her left olecranon and then more recently towards the end of last year earlier this year was in for a skin tear on her left lower leg caused by her dog. She comes in today with a reasonably acute area on her left forearm again apparently caused by her dog although it was not a bite injury. She was seen in urgent care and they managed to reoppose a lot of the skin tear although the patient states that is now reopened slightly. They have been using Neosporin. She has very  fragile skin probably a combination of chronic steroid use for rheumatoid arthritis [although I do not see that she is on that now] solar skin damage. I note that she takes methotrexate and Plaquenil for her rheumatoid arthritis 9/15; this patient had a skin tear on her left forearm from trauma with her dog. She was seen in urgent care and they reapposed most of the separated skin. Fortunately for her most of this is remained intact and the skin above and below the wound actually looks healthy. Where there was a separation and the 2 flaps of skin there is still an open area however the granulation tissue looks healthy here. We have been using Xeroform however I had like to change to [facility] 9/29; skin tear on her left arm from a dog scratch. The skin was reapposed in an urgent care and surprisingly stayed viable. We used Hydrofera Blue last time the wound is smaller 10/13; patient comes back to clinic after 2 weeks for a left arm wound secondary to a dog scratch. The skin was reapposed and in urgent care. We have been using Hydrofera Blue and the wound is closed today. Readmission 02/17/2022 Ms. Tyrhonda Georgiades is an 88 year old female with a past medical history of chronic venous insufficiency and lymphedema, COPD, rheumatoid arthritis and hypertension that presents with a 2-week history of wounds to her lower extremities bilaterally. She states she had a skin tag to the right posterior leg that came off and became a wound. She states the area has slowly gotten larger. She was seen by her primary care physician's office on 02/15/2022 for this issue. She was prescribed Bactroban and cefdinir. She has 2 other wounds 1 located to each leg. These were from trauma. She states one was from trying to put on compression stockings. Her ABIs in office on the left was 0.93 and 1.0 on the right. She currently denies systemic signs of infection. 02/24/2022 upon evaluation today patient appears to be doing well  currently in regard to her skin tears. In general I feel like she is making good progress. Fortunately I do not see any evidence of active infection locally or systemically which is great news. No fevers, chills, nausea, vomiting, or diarrhea. 03/04/2022 upon evaluation patient actually appears to be doing well in regard to the wounds on her legs. She has been tolerating the dressing changes without complication. Overall I think all  are showing signs of improvement which is great news. 03/10/2022 upon evaluation today patient appears to be doing better in regard to her wounds in general. I am very pleased with where things stand. I do believe that she is showing signs of good improvement which is great news. No fevers, chills, nausea, vomiting, or diarrhea. 03-21-2022 upon evaluation today patient appears to be doing well with regard to her wound on the left leg and the anterior right leg both are showing signs of significant improvement. Unfortunately she has a lot of slough and biofilm buildup on the surface of the posterior wound which is not good. I do not see any signs of active infection locally or systemically which is great news. No fevers, chills, nausea, vomiting, or diarrhea. 03-29-2022 upon evaluation today patient appears to be doing well currently in regard to her wounds. She has been tolerating the dressing changes without complication. Fortunately there does not appear to be any signs of active infection at this time which is great news. No fevers, chills, nausea, vomiting, or diarrhea. 4/27; right posterior calf and left lateral calf both initially traumatic in the setting of significant venous insufficiency. She did not tolerate compression wraps apparently because she could not get shoes on her feet and therefore we have been using Tubigrip's and silver alginate 5/8; right posterior calf 100% covered with adherent debris. The area on the left lateral really looks quite good under  illumination islands of new epithelialization. We have been using Xeroform on the left Iodoflex on the right bilateral 2 layer Tubigrip's 04-28-2022 upon evaluation today patient appears to be doing well with regard to her wound she has been tolerating the dressing changes which is great news. Overall I feel like the Xeroform is doing well on the left leg this is pretty much healed on the right leg she is very close to being healed as well I think we can probably switch over to Xeroform here as well. 05-05-2022 upon evaluation today patient appears to be doing excellent in regard to her wounds both are showing signs of being very close to complete resolution which is awesome news. Fortunately I do not see any signs of active infection at this time which is great news and in general I think we are headed in the right direction. 05-13-2022 upon evaluation today patient appears to be doing well with regard to her wounds. She however does have some breakdown due to the fact that I think is staying too moist on the left lateral aspect. The right is also staying a little bit too moist in my opinion. Subsequently I think that we may want to switch the dressings currently. I would recommend switching over to a silver cell dressing. 6/13; apparently the patient was felt to be having home health by [facility] therefore she did not receive supplies for her wound care. Current dressing remained on for for 5 days. Removed today by her intake nurse with purulent drainage and the left lateral lower leg is considerably larger also slightly larger on the right. They are changing this usually every second day. At this point I am not really sure why the patient flagged as having home health 05-31-2022 upon evaluation today patient appears to be doing well with regard to her wounds in general. Fortunately I do not see any signs of active infection at this time systemically though locally she did have an infection last week and  she is doing some better but was only able to take  3 days total of her medication. I will go ahead and get her sent in a prescription for doxycycline because the Bactrim was causing her stomach upset and I think probably the treatment matter is that she will benefit from at least 7 to 10 days of this doxycycline to make sure the infection is completely gone. 06-07-2022 upon evaluation today patient appears to be doing better. Her right leg is completely healed which is great news. The left leg is improving though not completely healed I think we are on the right track here still and I am very pleased with where things stand at this point. There does not appear to be any sign of infection at all at this time. 06-13-2022 upon evaluation today patient appears to be doing well with regard to her wound in fact this is showing signs of excellent improvement which is great news. Fortunately there does not appear to be any evidence of active infection locally or systemically at this time. No fevers, chills, nausea, vomiting, or diarrhea. KIERSTEN, COSS ([id number]) 06-21-2022 upon evaluation today patient's wound on the right leg is still doing well the left leg had a lot of dry skin trapping fluid up underneath. Fortunately there does not appear to be any evidence of infection locally or systemically at this time which is great news and overall very pleased in that regard. Nonetheless I do believe that we need to try to get some of this away so that the alginate can collect the drainage. 06-30-2022 upon evaluation patient continues to have a wound that just seems to continue to morph and change as we progress here. I do not see any signs of infection systemically though locally I am seeing some issues here with erythema and minimal warmth I think that she could benefit from topical antibiotic therapy possibly with Jodie Echevaria initially I will probably use gentamicin while we obtain a culture and ensure we know  what exactly we are dealing with here. Objective Constitutional Well-nourished and well-hydrated in no acute distress. Vitals Time Taken: 2:33 PM, Height: 60 in, Weight: 132 lbs, BMI: 25.8, Temperature: 98.1 F, Pulse: 68 bpm, Respiratory Rate: 16 breaths/min, Blood Pressure: 128/62 mmHg. Respiratory normal breathing without difficulty. Psychiatric this patient is able to make decisions and demonstrates good insight into disease process. Alert and Oriented x 3. pleasant and cooperative. General Notes: Upon inspection patient's wound bed actually showed signs of good granulation and epithelization at this point. Fortunately I do not see any signs of infection systemically which is great news. With that being said locally that is a different story. We do need to try to get this sealed up. Integumentary (Hair, Skin) Wound #13 status is Open. Original cause of wound was Trauma. The date acquired was: 02/09/2022. The wound has been in treatment 19 weeks. The wound is located on the Left Lower Leg. The wound measures 9.5cm length x 8cm width x 0.1cm depth; 59.69cm^2 area and 5.969cm^3 volume. There is Fat Layer (Subcutaneous Tissue) exposed. There is a medium amount of serosanguineous drainage noted. There is medium (34- 66%) red granulation within the wound bed. There is a medium (34-66%) amount of necrotic tissue within the wound bed including Adherent Slough. Assessment Active Problems ICD-10 Laceration without foreign body, left lower leg, initial encounter Laceration without foreign body, right lower leg, initial encounter Non-pressure chronic ulcer of other part of right lower leg with fat layer exposed Non-pressure chronic ulcer of other part of left lower leg with fat layer exposed Venous  insufficiency (chronic) (peripheral) Plan Follow-up Appointments: Return Appointment in 1 week. Bathing/ Shower/ Hygiene: May shower; gently cleanse wound with antibacterial soap, rinse and pat dry  prior to dressing wounds No tub bath. Anesthetic (Use 'Patient Medications' Section for Anesthetic Order Entry): Lidocaine applied to wound bed Edema Control - Lymphedema / Segmental Compressive Device / Other: Tubigrip double layer applied - size c Elevate, Exercise Daily and Avoid Standing for Long Periods of Time. JAIA, ALONGE ([id number]) Elevate legs to the level of the heart and pump ankles as often as possible Elevate leg(s) parallel to the floor when sitting. Off-Loading: Turn and reposition every 2 hours - recommend prop legs/heels so wounds do not touch the bed/chair Additional Orders / Instructions: Follow Nutritious Diet and Increase Protein Intake Laboratory ordered were: Wound culture routine - Culture obtained left lower leg The following medication(s) was prescribed: gentamicin topical 0.1 % cream cream topical applied in a thin film to the wound bed 3 times per week with each dressing change x 30 days starting 06/30/2022 WOUND #13: - Lower Leg Wound Laterality: Left Cleanser: Soap and Water Every Other Day/30 Days Discharge Instructions: Gently cleanse wound with antibacterial soap, rinse and pat dry prior to dressing wounds Topical: Gentamicin Every Other Day/30 Days Discharge Instructions: Apply thin layer to wound as directed by provider. Primary Dressing: Cutimed Sorbact 1.5x 2.38 (in/in) (DME) (Generic) Every Other Day/30 Days Discharge Instructions: A bacteria- and fungi binding wound dressing, suitable for cavities and fistulas. It is suitable as a wound filler and allows the passage of wound exudate into a secondary dressing. The dressing helps reducing odor and pain and can improve healing. Secondary Dressing: Zetuvit Plus 4x8 (in/in) (DME) (Generic) Every Other Day/30 Days 1. I am going to suggest that we going continue with the wound care measures as before with regard to compression I am wanting them to be able to change this at home and so that currently  is happening 3 times per week. 2. I am going to switch to a treatment with topical gentamicin followed by Sorbact in order to help siphon away any drainage and bacteria from the surface of the wound. 3. I am also can recommend that we continue with the Tubigrip size C which does seem to be doing decently well and allows for them to change this at home as well which is also good news. 4. I am also can recommend that we depending on the results of the culture may want to consider starting [place] topical antibiotic therapy. Again I think that that could be beneficial although if she does well with the gentamicin along that may not even be necessary. We will see where things go from here. The topical gentamicin was sent into the pharmacy. I am trying to avoid oral medications as she is not very tolerant of the majority of these. We will see patient back for reevaluation in 1 week here in the clinic. If anything worsens or changes patient will contact our office for additional recommendations. Electronic Signature(s) Signed: 06/30/2022 4:50:13 PM By: Lenda Kelp PA-C Entered By: Lenda Kelp on 06/30/2022 16:50:13 [name] ([id number]) -------------------------------------------------------------------------------- SuperBill Details Patient Name: [name] Date of Service: 06/30/2022 Medical Record Number: [id number] Patient Account Number: [id number] Date of Birth/Sex: 12-09-1930 (88 y.o. F) Treating RN: Yevonne Pax Primary Care Provider: Aram Beecham Other Clinician: Betha Loa Referring Provider: Aram Beecham Treating Provider/Extender: Rowan Blase in Treatment: 19 Diagnosis Coding ICD-10 Codes Code Description [phone] Laceration without  foreign body, left lower leg, initial encounter S81.811A Laceration without foreign body, right lower leg, initial encounter L97.812 Non-pressure chronic ulcer of other part of right lower leg with fat layer  exposed L97.822 Non-pressure chronic ulcer of other part of left lower leg with fat layer exposed I87.2 Venous insufficiency (chronic) (peripheral) Facility Procedures CPT4 Code: 16109604 Description: 99213 - WOUND CARE VISIT-LEV 3 EST PT Modifier: Quantity: 1 Physician Procedures CPT4 Code: 5409811 Description: 99214 - WC PHYS LEVEL 4 - EST PT Modifier: Quantity: 1 CPT4 Code: Description: ICD-10 Diagnosis Description S81.812A Laceration without foreign body, left lower leg, initial encounter S81.811A Laceration without foreign body, right lower leg, initial encounter L97.812 Non-pressure chronic ulcer of other part of right  lower leg with fat la L97.822 Non-pressure chronic ulcer of other part of left lower leg with fat lay Modifier: yer exposed er exposed Quantity: Electronic Signature(s) Signed: 06/30/2022 4:50:36 PM By: Lenda Kelp PA-C Entered By: Lenda Kelp on 06/30/2022 16:50:36

## 2022-06-30 NOTE — Progress Notes (Addendum)
[name], [name] ([id number]) Visit Report for 06/30/2022 Arrival Information Details Patient Name: [name], [name]. Date of Service: 06/30/2022 2:15 PM Medical Record Number: [id number] Patient Account Number: [id number] Date of Birth/Sex: 22-Sep-1930 (88 Barnett.o. F) Treating RN: Carlene Coria Primary Care Ivylynn Hoppes: Fulton Reek Other Clinician: Massie Kluver Referring Shadow Schedler: Fulton Reek Treating Parneet Glantz/Extender: Skipper Cliche in Treatment: 19 Visit Information History Since Last Visit All ordered tests and consults were completed: No Patient Arrived: Wheel Chair Added or deleted any medications: No Arrival Time: 14:25 Any new allergies or adverse reactions: No Transfer Assistance: EasyPivot Patient Lift Had a fall or experienced change in No activities of daily living that may affect Patient Requires Transmission-Based No risk of falls: Precautions: Hospitalized since last visit: No Patient Has Alerts: Yes Pain Present Now: Yes Patient Alerts: NOT diabetic Electronic Signature(s) Signed: 07/01/2022 4:34:39 PM By: Massie Kluver Entered By: Massie Kluver on 06/30/2022 14:31:29 [name] ([id number]) -------------------------------------------------------------------------------- Clinic Level of Care Assessment Details Patient Name: [name] Date of Service: 06/30/2022 2:15 PM Medical Record Number: [id number] Patient Account Number: [id number] Date of Birth/Sex: Jul 07, 1930 (88 Barnett.o. F) Treating RN: Carlene Coria Primary Care Jessieca Rhem: Fulton Reek Other Clinician: Massie Kluver Referring Jelena Malicoat: Fulton Reek Treating Lakeya Mulka/Extender: Skipper Cliche in Treatment: 19 Clinic Level of Care Assessment Items TOOL 4 Quantity Score _0  - Use when only an EandM is performed on FOLLOW-UP visit 0 ASSESSMENTS - Nursing Assessment / Reassessment X - Reassessment of Co-morbidities (includes updates in patient status) 1 10 X- 1 5 Reassessment of  Adherence to Treatment Plan ASSESSMENTS - Wound and Skin Assessment / Reassessment X - Simple Wound Assessment / Reassessment - one wound 1 5 _1  - 0 Complex Wound Assessment / Reassessment - multiple wounds _2  - 0 Dermatologic / Skin Assessment (not related to wound area) ASSESSMENTS - Focused Assessment _3  - Circumferential Edema Measurements - multi extremities 0 _4  - 0 Nutritional Assessment / Counseling / Intervention _5  - 0 Lower Extremity Assessment (monofilament, tuning fork, pulses) _6  - 0 Peripheral Arterial Disease Assessment (using hand held doppler) ASSESSMENTS - Ostomy and/or Continence Assessment and Care _7  - Incontinence Assessment and Management 0 _8  - 0 Ostomy Care Assessment and Management (repouching, etc.) PROCESS - Coordination of Care X - Simple Patient / Family Education for ongoing care 1 15 _9  - 0 Complex (extensive) Patient / Family Education for ongoing care _10  - 0 Staff obtains Programmer, systems, Records, Test Results / Process Orders _11  - 0 Staff telephones HHA, Nursing Homes / Clarify orders / etc _12  - 0 Routine Transfer to another Facility (non-emergent condition) _13  - 0 Routine Hospital Admission (non-emergent condition) _14  - 0 New Admissions / Biomedical engineer / Ordering NPWT, Apligraf, etc. _15  - 0 Emergency Hospital Admission (emergent condition) X- 1 10 Simple Discharge Coordination _16  - 0 Complex (extensive) Discharge Coordination PROCESS - Special Needs _17  - Pediatric / Minor Patient Management 0 _18  - 0 Isolation Patient Management _19  - 0 Hearing / Language / Visual special needs _20  - 0 Assessment of Community assistance (transportation, D/C planning, etc.) _21  - 0 Additional assistance / Altered mentation _22  - 0 Support Surface(s) Assessment (bed, cushion, seat, etc.) INTERVENTIONS - Wound Cleansing / Measurement [name], [name] ([id number]) X- 1 5 Simple Wound Cleansing - one wound _23  - 0 Complex Wound Cleansing - multiple  wounds X- 1 5 Wound Imaging (photographs - any number of wounds) _24  - 0 Wound Tracing (instead of photographs) X- 1 5 Simple Wound Measurement - one  wound _0  - 0 Complex Wound Measurement - multiple wounds INTERVENTIONS - Wound Dressings _1  - Small Wound Dressing one or multiple wounds 0 X- 1 15 Medium Wound Dressing one or multiple wounds _2  - 0 Large Wound Dressing one or multiple wounds <YTKZSWFUXNATFTDD>_2<\/KGURKYHCWCBJSEGB>_1  - 0 Application of Medications - topical <DVVOHYWVPXTGGYIR>_4<\/WNIOEVOJJKKXFGHW>_2  - 0 Application of Medications - injection INTERVENTIONS - Miscellaneous _5  - External ear exam 0 _6  - 0 Specimen Collection (cultures, biopsies, blood, body fluids, etc.) X- 1 5 Specimen(s) / Culture(s) sent or taken to Lab for analysis _7  - 0 Patient Transfer (multiple staff / Harrel Lemon Lift / Similar devices) _8  - 0 Simple Staple / Suture removal (25 or less) _9  - 0 Complex Staple / Suture removal (26 or more) _10  - 0 Hypo / Hyperglycemic Management (close monitor of Blood Glucose) _11  - 0 Ankle / Brachial Index (ABI) - do not check if billed separately X- 1 5 Vital Signs Has the patient been seen at the hospital within the last three years: Yes Total Score: 85 Level Of Care: New/Established - Level 3 Electronic Signature(s) Signed: 07/01/2022 4:34:39 PM By: Massie Kluver Entered By: Massie Kluver on 06/30/2022 15:26:32 [name] ([id number]) -------------------------------------------------------------------------------- Lower Extremity Assessment Details Patient Name: [name] Date of Service: 06/30/2022 2:15 PM Medical Record Number: [id number] Patient Account Number: [id number] Date of Birth/Sex: 19-Mar-1930 (88 Barnett.o. F) Treating RN: Carlene Coria Primary Care Antanette Richwine: Fulton Reek Other Clinician: Massie Kluver Referring Akon Reinoso: Fulton Reek Treating Lovetta Condie/Extender: Skipper Cliche in Treatment: 19 Edema Assessment Assessed: [Left: Yes] [Right: No] Edema: [Left: Ye] [Right: s] Calf Left:  Right: Point of Measurement: 32 cm From Medial Instep 36 cm Ankle Left: Right: Point of Measurement: 10 cm From Medial Instep 23.4 cm Vascular Assessment Pulses: Dorsalis Pedis Palpable: [Left:Yes] Posterior Tibial Palpable: [Left:Yes] Electronic Signature(s) Signed: 07/01/2022 4:34:39 PM By: Massie Kluver Signed: 07/05/2022 3:54:37 PM By: Carlene Coria RN Entered By: Massie Kluver on 06/30/2022 14:51:00 [name] ([id number]) -------------------------------------------------------------------------------- Multi Wound Chart Details Patient Name: [name] Date of Service: 06/30/2022 2:15 PM Medical Record Number: [id number] Patient Account Number: [id number] Date of Birth/Sex: 1930-07-29 (88 Barnett.o. F) Treating RN: Carlene Coria Primary Care Jackston Oaxaca: Fulton Reek Other Clinician: Massie Kluver Referring Kaiana Marion: Fulton Reek Treating Lakrisha Iseman/Extender: Skipper Cliche in Treatment: 19 Vital Signs Height(in): 60 Pulse(bpm): 84 Weight(lbs): 132 Blood Pressure(mmHg): 128/62 Body Mass Index(BMI): 25.8 Temperature(F): 98.1 Respiratory Rate(breaths/min): 16 Photos: [N/A:N/A] Wound Location: Left Lower Leg N/A N/A Wounding Event: Trauma N/A N/A Primary Etiology: Skin Tear N/A N/A Comorbid History: Cataracts, Glaucoma, Optic N/A N/A Neuritis, Chronic sinus problems/congestion, Middle ear problems, Hypertension, Rheumatoid Arthritis Date Acquired: 02/09/2022 N/A N/A Weeks of Treatment: 19 N/A N/A Wound Status: Open N/A N/A Wound Recurrence: No N/A N/A Measurements L x W x D (cm) 9.5x8x0.1 N/A N/A Area (cm) : 59.69 N/A N/A Volume (cm) : 5.969 N/A N/A % Reduction in Area: -3796.20% N/A N/A % Reduction in Volume: -3801.30% N/A N/A Classification: Full Thickness Without Exposed N/A N/A Support Structures Exudate Amount: Medium N/A N/A Exudate Type: Serosanguineous N/A N/A Exudate Color: red, brown N/A N/A Granulation Amount: Medium (34-66%) N/A  N/A Granulation Quality: Red N/A N/A Necrotic Amount: Medium (34-66%) N/A N/A Exposed Structures: Fat Layer (Subcutaneous Tissue): N/A N/A Yes Fascia: No Tendon: No Muscle: No Joint: No Bone: No Epithelialization: None N/A N/A Treatment Notes Electronic Signature(s) Signed: 07/01/2022 4:34:39 PM By: Massie Kluver Entered By: Massie Kluver on 06/30/2022 14:51:14 [name] ([id number]) [name], [name] ([id number]) --------------------------------------------------------------------------------  Multi-Disciplinary Care Plan Details Patient Name: JOBY, [name]. Date of Service: 06/30/2022 2:15 PM Medical Record Number: [id number] Patient Account Number: [id number] Date of Birth/Sex: 06/13/1930 (88 Barnett.o. F) Treating RN: Carlene Coria Primary Care Najir Roop: Fulton Reek Other Clinician: Massie Kluver Referring Marjorie Deprey: Fulton Reek Treating Wilkin Lippy/Extender: Skipper Cliche in Treatment: 19 Active Inactive Necrotic Tissue Nursing Diagnoses: Impaired tissue integrity related to necrotic/devitalized tissue Knowledge deficit related to management of necrotic/devitalized tissue Goals: Necrotic/devitalized tissue will be minimized in the wound bed Date Initiated: 06/21/2022 Target Resolution Date: 06/21/2022 Goal Status: Active Patient/caregiver will verbalize understanding of reason and process for debridement of necrotic tissue Date Initiated: 06/21/2022 Target Resolution Date: 06/21/2022 Goal Status: Active Interventions: Assess patient pain level pre-, during and post procedure and prior to discharge Provide education on necrotic tissue and debridement process Treatment Activities: Excisional debridement : 06/21/2022 Notes: Wound/Skin Impairment Nursing Diagnoses: Knowledge deficit related to ulceration/compromised skin integrity Goals: Patient/caregiver will verbalize understanding of skin care regimen Date Initiated: 02/17/2022 Date Inactivated:  03/21/2022 Target Resolution Date: 03/20/2022 Goal Status: Met Ulcer/skin breakdown will have a volume reduction of 30% by week 4 Date Initiated: 02/17/2022 Date Inactivated: 03/21/2022 Target Resolution Date: 03/20/2022 Goal Status: Met Ulcer/skin breakdown will have a volume reduction of 50% by week 8 Date Initiated: 02/17/2022 Target Resolution Date: 04/19/2022 Goal Status: Active Ulcer/skin breakdown will have a volume reduction of 80% by week 12 Date Initiated: 02/17/2022 Target Resolution Date: 05/20/2022 Goal Status: Active Ulcer/skin breakdown will heal within 14 weeks Date Initiated: 02/17/2022 Target Resolution Date: 06/19/2022 Goal Status: Active Interventions: Assess patient/caregiver ability to obtain necessary supplies Assess patient/caregiver ability to perform ulcer/skin care regimen upon admission and as needed Assess ulceration(s) every visit Notes: [name], [name] ([id number]) Electronic Signature(s) Signed: 07/01/2022 4:34:39 PM By: Massie Kluver Signed: 07/05/2022 3:54:37 PM By: Carlene Coria RN Entered By: Massie Kluver on 06/30/2022 14:51:05 [name] ([id number]) -------------------------------------------------------------------------------- Pain Assessment Details Patient Name: [name] Date of Service: 06/30/2022 2:15 PM Medical Record Number: [id number] Patient Account Number: [id number] Date of Birth/Sex: 05/11/1930 (88 Barnett.o. F) Treating RN: Carlene Coria Primary Care Chasidy Janak: Fulton Reek Other Clinician: Massie Kluver Referring Chameka Mcmullen: Fulton Reek Treating Chardai Gangemi/Extender: Skipper Cliche in Treatment: 19 Active Problems Location of Pain Severity and Description of Pain Patient Has Paino Yes Site Locations Pain Location: Generalized Pain, Pain in Ulcers Duration of the Pain. Constant / Intermittento Intermittent Rate the pain. Current Pain Level: 1 Worst Pain Level: 9 Character of Pain Describe the Pain: Aching, Throbbing Pain  Management and Medication Current Pain Management: Medication: Yes Rest: No Activity: Yes Electronic Signature(s) Signed: 07/01/2022 4:34:39 PM By: Massie Kluver Signed: 07/05/2022 3:54:37 PM By: Carlene Coria RN Entered By: Massie Kluver on 06/30/2022 14:36:44 [name] ([id number]) -------------------------------------------------------------------------------- Wound Assessment Details Patient Name: [name] Date of Service: 06/30/2022 2:15 PM Medical Record Number: [id number] Patient Account Number: [id number] Date of Birth/Sex: April 03, 1930 (88 Barnett.o. F) Treating RN: Carlene Coria Primary Care Karsten Vaughn: Fulton Reek Other Clinician: Massie Kluver Referring Jamyiah Labella: Fulton Reek Treating Bobbye Reinitz/Extender: Skipper Cliche in Treatment: 19 Wound Status Wound Number: 13 Primary Skin Tear Etiology: Wound Location: Left Lower Leg Wound Open Wounding Event: Trauma Status: Date Acquired: 02/09/2022 Comorbid Cataracts, Glaucoma, Optic Neuritis, Chronic sinus Weeks Of Treatment: 19 History: problems/congestion, Middle ear problems, Hypertension, Clustered Wound: No Rheumatoid Arthritis Photos Wound Measurements Length: (cm) 9.5 Width: (cm) 8 Depth: (cm) 0.1 Area: (cm) 59.69 Volume: (cm) 5.969 % Reduction in Area: -3796.2% % Reduction in Volume: -  3801.3% Epithelialization: None Wound Description Classification: Full Thickness Without Exposed Support Structu Exudate Amount: Medium Exudate Type: Serosanguineous Exudate Color: red, brown res Foul Odor After Cleansing: No Slough/Fibrino No Wound Bed Granulation Amount: Medium (34-66%) Exposed Structure Granulation Quality: Red Fascia Exposed: No Necrotic Amount: Medium (34-66%) Fat Layer (Subcutaneous Tissue) Exposed: Yes Necrotic Quality: Adherent Slough Tendon Exposed: No Muscle Exposed: No Joint Exposed: No Bone Exposed: No Electronic Signature(s) Signed: 07/01/2022 4:34:39 PM By: Massie Kluver Signed: 07/05/2022 3:54:37 PM By: Carlene Coria RN Entered By: Massie Kluver on 06/30/2022 14:48:14 [name] ([id number]) -------------------------------------------------------------------------------- Vitals Details Patient Name: [name] Date of Service: 06/30/2022 2:15 PM Medical Record Number: [id number] Patient Account Number: [id number] Date of Birth/Sex: September 27, 1930 (88 Barnett.o. F) Treating RN: Carlene Coria Primary Care Jodell Weitman: Fulton Reek Other Clinician: Massie Kluver Referring Marialice Newkirk: Fulton Reek Treating Junella Domke/Extender: Skipper Cliche in Treatment: 19 Vital Signs Time Taken: 14:33 Temperature (F): 98.1 Height (in): 60 Pulse (bpm): 68 Weight (lbs): 132 Respiratory Rate (breaths/min): 16 Body Mass Index (BMI): 25.8 Blood Pressure (mmHg): 128/62 Reference Range: 80 - 120 mg / dl Electronic Signature(s) Signed: 07/01/2022 4:34:39 PM By: Massie Kluver Entered By: Massie Kluver on 06/30/2022 14:36:38

## 2022-07-03 LAB — AEROBIC CULTURE W GRAM STAIN (SUPERFICIAL SPECIMEN): Gram Stain: NONE SEEN

## 2022-07-07 ENCOUNTER — Encounter: Payer: Medicare Other | Admitting: Internal Medicine

## 2022-07-07 DIAGNOSIS — S81812A Laceration without foreign body, left lower leg, initial encounter: Secondary | ICD-10-CM | POA: Diagnosis not present

## 2022-07-07 NOTE — Progress Notes (Signed)
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 a 3-5/10 depending on whether the area is being cleansed/touched or not. She always has some pain however. She does see [facility] and does have compression hose that she typically wears. She states however she has not been wearing them as much since she was dealing with this issue due to the fact that she notes that the wound seems to leak and bleed more when she has the compression hose on. 11/22/17; patient was readmitted to clinic last  week with a traumatic wound on her right anterior leg. This is a reasonably small wound but covered in an adherent necrotic debris. She is been using Santyl. 11/29/17 minimal improvement in wound dimensions to this initially traumatic wound on her right anterior leg. Reasonably small wound but still adherent thick necrotic debris. We have been using Santyl 12/06/17 traumatic wound on the right anterior leg. Small wound but again adherent necrotic debris on the surface 95%. We have been using Santyl 12/13/86; small lright anterior traumatic leg wound. Using Santyl that again with adherent debris perhaps down to 50%. I changed her to Iodoflex today 12/20/17; right anterior leg traumatic wound. She again presents with debris about 50% of the wound. I changed her to Iodoflex last week but so far not a lot in the way of response 12/27/17; right anterior leg traumatic wound. She again presents with debris on the wound although it looks better. She is using Iodoflex entering her third week now. Still requiring debridement 01/16/18 on evaluation today patient seems to be doing fairly well in regard to her right lower extremity ulcer. She has been tolerating the dressing changes without complication. With that being said she does note that she's been having a lot of burning with the current dressing which is specifically the Iodoflex. Obviously this is a known side effect of the iodine in the dressing and I believe that may be giving her trouble. No fevers, chills, nausea, or vomiting noted at this time. Otherwise the wound does appear to be doing well. 01/30/18 on evaluation today patient appears to be doing well in regard to her right anterior lower extremity ulcer. She notes that this does seem to be smaller and  she wonders why we did not start the Prisma dressing sooner since it has made such a big difference in such a short amount of time. I explained that obviously we have to wait for the wound to get to a  certain point along his healing path before we can initiate the Prisma otherwise it will not be effective. Therefore once the wound became clean it was then time to initiate the Prisma. Nonetheless good news is she is noting excellent improvement she does still have some discomfort but nothing as significant as previously noted. 04/17/18 on evaluation today patient appears to be doing very well and in fact her right lower extremity ulcer has completely healed at this point I'm pleased with this. The left lower extremity ulcer seem to be doing better although she still does have some openings noted the Prisma I think is helping more than the Xeroform was in my pinion. With that being said she still has a lot of healing to do in this regard. 04/27/18 on evaluation today patient appears to be doing very well in regard to her left lower Trinity ulcers. She has been tolerating the dressing changes without complication. I do have a note from her orthopedic surgeon today and they would like for me to help with treating her left elbow surgery site where she had the bursa removed and this was performed roughly 4 weeks ago according to the note that I reviewed. She has been placed on Bactrim DS by need for her leg wounds this probably helped a little bit with the left elbow surgery site. Obviously I do think this is something we can try to help her out with. 05/04/18 on evaluation today patient appears to be doing well in regard to her left anterior lower Trinity ulcers. She is making good progress which is great news. Unfortunately her elbow which we are also managing at this point in time has not made as much progress unfortunately. She has been tolerating the dressing changes without complication. She did see Dr. Darleen Crocker earlier today and he states that he's willing to give this three weeks to see if she's making any progress with wound care. However he states that she's really not then he will need to go back in  and perform further surgery. Obviously she is trying to avoid surgery if at all possible although I'm not sure if this is going to be possible or least not that quickly. 05/11/18 on evaluation today patient appears to be doing very well in regard to her left lower extremity ulcers. Unfortunately in regard to her elbow this is very slow coming about as far as any improvement is concerned. I do feel like there may be a little bit more granulation noted in the base of the wound but nothing too significant unfortunately. I still can probe bone in the proximal portion of the wound which obviously explain to [place], Liborio Nixon V. ([id number]) the patient is not good. She will be having a follow-up with her orthopedic surgeon in the next couple of weeks. In the meantime we are trying to do as much as we can to try to show signs of improvement in healing to avoid the need for any additional and further surgery. Nonetheless I explained to the patient yet again today I'm not sure if that is going to be feasible or not obviously it's more risk for her to continue to have an open wound with bone exposure then to the back in for additional surgery even  though I know she doesn't want to go that route. 05/15/18 on evaluation today patient presents for follow-up concerning her ongoing lower extremity ulcers on the left as well as the left elbow ulcer. She has at this point in time been tolerating the dressing changes without complication. Her left lower extremity ulcer appears to be doing very well. In regard to the left elbow ulcer she actually does seem to have additional granulation today which is good news. I am definitely seeing signs of improvement although obviously this is somewhat slow improvement. Nonetheless I'm hopeful we will be able to avoid her having to have any further surgery but again that would definitely be a conversation between herself as well as her surgeon once he sees her for reevaluation. Otherwise  she does want to see about having a three order compression stockings for her today 05/21/18 on evaluation today patient appears to be doing well in regard to her left lower surety ulcer. This is almost completely healed and seems to be progressing very nicely. With that being said her left elbow is another story. I'm not really convinced in the past three weeks we've seen a significant improvement in this wound. With that being said if this is something that there is no surgical option for him we have to continue to work on this from the standpoint of conservative management with wound care she may make improvement given time. Nonetheless it appears that her surgeon is somewhat concerned about the possibility of infection and really is leaning towards additional surgery to try and help close this wound. Nonetheless the patient is still unsure of exactly what to do. 05/29/18 on evaluation today patient appears to be doing well in regard to her left lower extremity ulcer. She's been tolerating the dressing changes without complication which is good news. With that being said she's been having issues specifically with her elbow she did see her surgeon Dr. Joice Lofts and he is recommending a repeat surgery to the left elbow in order to correct the issue. The patient is still somewhat unsure of this but feels like this may be better than trying to take time to let this heal over a longer period of time through normal wound care measures. Again I explained that I agree this may be a faster way to go if her surgeon feels that this is indeed a good direction to take. Obviously only he can make the judgment on whether or not the surgery would likely be successful. 06/04/18 on evaluation today patient actually presents for follow-up concerning her left elbow and left lower from the ulcer she seems to be doing very well at this point in time. She has been tolerating the dressing changes without complication. With that  being said her elbow is not significantly better she actually is scheduled for surgery tomorrow. 07/04/18; the patient had an area on her left leg that is remaining closed. The open area she has now is a postsurgical wound on the left elbow. I think we have clearance from the surgeon to see this now. We're using Prisma 07/11/18; we're currently dealing with a surgical wound on the left olecranon process. The patient complains of a lot of pain and drainage. When I saw her last week we did an x-ray that showed soft tissue wound and probable elbow joint effusion but no erosion to suggest osteomyelitis. The culture I did of this was somewhat surprisingly negative. She has a small open wound with not a viable surface there is  considerable undermining relative to the wound size. She is on methotrexate for rheumatoid arthritis/overlap syndrome also plaquenil. We've been using silver collagen 07/18/18-She is seen in follow-up evaluation for a left elbow wound. There is essentially no change. She is currently on Zithromax and will complete that on Friday, there is no indication to extend this. We will change to iodosorb/iodoflex and monitor for response 07/25/18-She is seen in follow-up evaluation for left elbow wound. The wound is stable with no overt evidence of infection. She has counseled with her rheumatologist. She is wanting to restart her methotrexate; a culture was obtained to rule out occult infection before starting her methotrexate. We will continue with Iodosorb/Iodoflex and she will follow-up next week. 08/01/18; this is a difficult wound over her left olecranon process. There is been concerned about infection although cultures including one done last week were negative. Pending 3 weeks ago I gave her an empiric course of antibiotics. She is having a lot of rheumatologic pain in her hands with pain and stiffness. She wants to go on her weekly methotrexate and I think it would be reasonable to do so. We  have been using Iodoflex 08/01/18; difficult wound over her left olecranon process. She started back on methotrexate last week because of rheumatologic pain in her hands. We have been using Iodoflex to try and clean out the wound bed. She has been approved for Graphix PL 08/15/18; 2 week follow-up. Difficult wound over her left olecranon process. Graphix PL #1 with collagen backing 08/22/18; one-week follow-up. Difficult wound over her left olecranon process. Graphix PL #2 08/29/18; no major improvement. Difficult wound over her left olecranon process. Still considerable undermining. Graphics PL #3 1 week follow-up. Graphix #4 09/12/18 graphics #5. Some improvement in wound area although the undermining superiorly still has not closed down as much as I would like 09/19/18; Graphix #6 I think there is improvement in the undermining from 7 to 9:00. Wound bed looks healthy. 09/26/18 Graffix #7 undermining is 0.5 cm maximally at roughly 8:00. From 12 to 7:00 the tissue is adherent which is a major improvement there is some advancing skin from this side. 10/03/18; Graphix #8 no major changes from last week 10/10/18 Graffix #9 There are improvements. There appears to be granulation coming up to the surface here and there is a lot less undermining at 8:00. 10/17/18. Graffix #10; Dimensions are improved less undermining surface felt the but the wound is still open. Initially a surgical wound following a bursectomy 10/24/18; Graffix #11. This is really stalled over the last 2 weeks. If there is no further improvement this will be the last application.The final option for this difficult area would be plastic surgery and will set up a consult with Dr. Marina Goodell in [facility] 10/31/18; wound looks about the same. The undermining superiorly is 0.7 cm. On the lateral edges perhaps some improvement there is no drainage. 11-07-2018 patient seen today for follow-up and management of left elbow wound. She has completed a  total of 11 treatments of the graffix with not much improvement. She has an upcoming appointment with plastic surgery to assist with additional treatment options for the left elbow wound on 11/19/18. There is significant amount surrounding undermining of the wound is 0.9 cm. Currently prescribed methotrexate. Wound is being treated with Indoform and border dressing. No drainage from wound. No fever, chills. or pain. 11/21/18; the patient continues to have the wound looking roughly the same with undermining from about 12 to 6:00. This has not changed all  that much. She does have skin irritation around the wound that looks like drainage maceration issues. The patient states that she was not able to have her wound dressing changed because of illness in the person he usually does this. She also did not attend her clinic appointment today with Dr. Marina Goodell because of transportation issues. She is rebooked for some time in mid January 11/28/2018; the patient has less undermining using endoform. As a understandings she saw Dr. Thad Ranger who is Dr. Lonni Fix partner. He recommended putting her in a elbow brace and I believe is written a prescription for it. He also recommended Motrin 800 mg 3 times daily. This is prescription strength ibuprofen although he did not write his prescription. This apparently was for 2 weeks. Culture I did last time grew a few methicillin sensitive staph aureus. After some difficulty due to drug intolerances/allergies and drug interactions I settled on a 5-day course of azithromycin 12/03/18 on evaluation today patient actually appears to be doing fairly well in regard to her elbow when compared to last time I evaluated her. With that being said there does not appear to be any signs of infection at this time. That was the big concern currently as far as the patient was concerned. Nonetheless I do feel like she is making progress in regard to the feeling of this ulcer it has been  slow. She did see a plastic surgeon JAYMA, VOLPI ([id number]) they are talking about putting her in a brace in order to allow this area to heal more appropriately. 12/19/2018; not much change in this from the last time I have saw this.'s much smaller area than when she first came in and with less circumferential undermining however this is never really adhered. She is wearing the brace that was given or prescribed to her by plastics. She did not have a procedure offered to attempt to close this. We have been using endoform 1/15; wound actually is not doing as well as last week. She was actually not supposed to come into this clinic again until next week but apparently her attendant noticed some redness increasing pain and she came in early. She reports the same amount of drainage. We have been using endoform. She is approved through puraply however I will only consider starting that next week 1/22; she completed the antibiotics last week. Culture I did was negative. In spite of this there is less erythema and pain complaints in the wound. Puraply #1 applied today 1/29; Puraply #2 today. Wound surface looks a lot better post debridement of adherent fibrinous material. However undermining from 6-12 is measuring worse 2/5; Puraply #3. Using her elbow brace 2/12 puraply #4 2/19 puraply #5. The 9:00 undermining measured at 0.5 cm. Undermining from 4-11 o'clock. Surface of the wound looks better and the circumference of the wound is smaller however the undermining is not really changed 2/26; still not much improvement. She has undermining from 4-9 o'clock 0.9 cm. Surface of the wound covered and adherent debris. 3/4; still no improvement. Undermining from 4-9 o'clock still around a centimeter. Surface of the wound looks somewhat better. No debridement is required we used endoform after we ended the trial of puraply last week 3/11; really no improvement at all. Still 1 cm undermining from roughly 9-3  o'clock. This is about a centimeter. The base of the wound looks fairly healthy. No debridement. We have been using endoform. I am really out of most usual options here. I could consider either another round of  an amniotic advanced treatment product example epifix or perhaps regranex. Understandably the patient is a bit frustrated. We did send her to plastic surgery for a consult. Other than prescribing her a brace to immobilize the elbow they did not think she was a candidate for any further surgery. Notable that the patient is not using the brace today 3/18-Patient returns for attention to the left elbow area which apparently looked red at the home health visit. Patient's elbow looks the same if not better compared with last visit. The area of ulceration remains the same, the base appears healthy. We are continuing to use endoform she has been encouraged to use the brace to keep the elbow straight 3/25; the patient has an appointment at the [facility] wound care center on 4/2. I had actually put her out indefinitely however she seems to want to come back here every week. This week she complains of increased pain and malodor. Dimensions of the elbow wound are larger. We have been using endoform 4/8; the patient went to [facility] where they apparently gave her meta honey and some border foam with a Tubigrip. She has been using this for a week. She says she was very impressed with them there. They did not offer her any surgical consultation. She seems to be coming back here for a second opinion on this, she does not wish to drive to [facility] every week 1/30; still using Medihoney foam border and a Tubigrip. Actually do not think she has anything on the arm at all specifically she is not using her brace 5/6; she has been using medihoney without a lot of improvement. Undermining maximum at 9:00 at 0.5 cm may be somewhat better. She is still complaining of discomfort. She uses her elbow brace at night but is not using  anything on the arm during the day, she finds it too restrictive 5/13; we switch the patient to endoform AG last week. She is complaining of more pain and worried about some circumferential erythema around the wound. I had planned to consider epifix in this wound however the patient came in with a request to see a plastic surgeon in [place] by the name of Dr. Wyline Mood who cared for a friend of hers. Noteworthy that I have already sent her to one plastic surgeon and she went for another second opinion at [facility] and they apparently did not send her to a plastic surgeon nevertheless the patient is fairly convinced that she might benefit from a skin graft which I am doubtful. She also has rheumatoid arthritis and is on methotrexate. This was originally a surgical wound for a bursectomy a year or 2 ago Readmission: 06/28/19 on evaluation today patient presents today for reevaluation but this is due to a new issue her elbow has completely healed and looks excellent. Her right anterior lower leg has a skin tear which was sustained from her dog who jumped up on her and inadvertently scratch the area causing the skin tear. This happened yesterday. She is having some discomfort but fortunately nothing too significant which is good news. No fevers, chills, nausea, or vomiting noted at this time. The skin fortunately was knocked one completely off and we are gonna see about we approximate in the skin as best we can in using Steri-Strips to hold this in place obviously if we can get some of this to reattach that would be beneficial 07/05/2019 on evaluation today patient actually appears to be doing okay although unfortunately the skin flap that we were  attempting to Steri- Stripped down last week did not take. She is developing a lot of fluid underneath the wound area unfortunately which again is not ideal. I think that this necrotic tissue needs to be removed and again it actually just wiped off during the  evaluation today as I was attempting to clean the wound there did not appear to be any significant issues underlying which is good although there was some purulent drainage I did want to go ahead and see about obtaining a culture from today in order to ensure that the Z-Pak that I placed her on earlier in the week was appropriate for treating what ever infection may be causing the issue currently. This was a deep wound culture obtained today. 07/12/2019 on evaluation today patient actually appears to be doing quite well with regard to her right lower extremity ulcer all things considering. There is still little bit of hematoma not it that is noted in the central portion of the wound along with some necrotic tissue but she is still having quite a bit of discomfort. For that reason I did not perform sharp debridement today although this wound does need some debridement of one type or another. I think we may attempt Iodoflex to see if this can be of benefit. Fortunately there is no signs of active infection at this point. 07/18/19 upon evaluation today patient appears to be doing better with regard to her ulcer on her right lower extremity. She's been tolerating the dressing changes without complication. The good news is she seems to be making excellent progress. Overall I'm pleased with there being no signs of infection. With that being said she does tell me that she's been having some discomfort with the wrap she's unsure of exactly what about the rafters causing her trouble. 07/25/2019 on evaluation today patient appears to be doing a little better in regard to her lower extremity ulcer. Unfortunately the alginate seems to be getting really stuck to the wound bed and surrounding periwound. Fortunately there is no signs of active infection at this time. There does not appear to be any evidence of infection currently. 08/01/2019 on evaluation today patient actually appears to be doing much better with regard  to her right lower extremity ulcer. She has been tolerating the dressing changes without complication. Fortunately her wound does not show any signs of infection and seems to be making good progress. No fevers, chills, nausea, vomiting, or diarrhea. 08/08/2019 on evaluation today patient actually appears to be doing better with regard to her leg ulcer. She has been tolerating the dressing changes without complication. With that being said I think we may want to switch the dressing up today just based on the appearance of the wound bed in general. Fortunately there is no evidence of infection currently. Her pain seems to be much better. Her son is present with her [name], [name] ([id number]) today. 08/15/2019 on evaluation today patient appears to be doing very well with regard to her right lower extremity ulcer. She still complains about feeling like the compression stocking/Tubigrip is too tight for her. Nonetheless I still think this is beneficial and is helping the wound to heal more effectively. Overall I am extremely pleased at this time with what I am seeing. 08/22/2019 on evaluation today patient actually appears to be doing quite well with regard to her right lower extremity. She has been tolerating the dressing changes without complication. Fortunately there is no signs of active infection at this time. She still  complains about the Tubigrip but nonetheless I think this is something that is of utmost importance for her to continue to wear if she is going to see this area heal appropriately. 08/29/2019 on evaluation today patient actually appears to be doing well visually in regard to her wound. Unfortunately in regard to overall pain she seems to be having more pain today which I am somewhat concerned about. There does not appear to be any signs of active infection that I can tell but again with increased pain that is definitely a concern here today. I do think it may be time to switch up the  dressing as well to something that will be a little bit more effective hopefully and new tissue growth she has done well with collagen in the past. 09/05/2019 on evaluation today patient actually appears to be doing well with regard to her leg ulcer. She has been tolerating the dressing changes without complication. Fortunately there is no signs of active infection. We did obtain a wound culture last week due to the fact that she still is very touchy as far as the surface of the wound is concerned. Unfortunately I am not sure what happened to that culture as there is no record in the computer system. Nonetheless we need to follow-up on this and try to see if we can identify what exactly happened with this specimen as we have not gotten a result back either obviously. 09/12/2019 on evaluation today patient actually appears to be showing signs of good improvement upon evaluation today. She states her pain is not nearly as bad as what it was which is also good news. She did have a wound culture which was reviewed today that showed no growth of bacteria which is good news. This coupled with the fact that her wound appears to be healing better, measuring smaller, and overall even the slough buildup is minimal compared to what we have seen previously, I feel like she is actually showing signs of excellent improvement today. 09/19/2019 on evaluation today patient appears to be doing well with regard to her wounds on evaluation today. She is showing signs of continued improvement week by week and everything is measuring significantly smaller which is great news. Overall very pleased with the progress that she has made. 09/26/2019 on evaluation today patient appears to be doing fairly well with regard to her right lower extremity ulcer. She does continue to have some drainage and again the smaller of the 2 wounds is not making as much progress as I like to see. We have been doing the silver collagen for some  time I think we may want to switch to [facility] she has a little bit of hyper granular tissue this may help to keep things from becoming too dramatic in that regard. Fortunately there is no signs of infection at this time. 10/03/2019 on evaluation today patient appears to be doing fairly well with regard to her lower extremity ulcers at this time. She has been tolerating the dressing changes without complication. Fortunately the wound is measuring smaller which is great news she continues to make great progress. 10/17/2019 on evaluation today patient appears to be doing well with regard to her wounds of her right lower extremity. She states she has been having some increased pain apparently we gave her a little bit smaller size Tubigrip last time she was here this may have been what was causing some issues. She is out of the Tubigrip that we had ordered for  her and therefore did not have any of the smaller size. Fortunately there is no evidence of active infection at this time. No fever chills noted 10/24/2019 upon evaluation today patient's wounds actually appear to be showing signs of good improvement which is excellent news. She has been tolerating the dressing changes without complication and overall I am rather pleased at this point with how things seem to be progressing. The patient likewise is very happy. She still was not happy with having to use the Tubigrip she states that bunches up in her shoe. 10/31/2019 on evaluation today patient actually appears to be doing quite well with regard to her leg ulcer. She has been tolerating the dressing changes without complication. Fortunately there is no signs of active infection at this time. Overall I am very pleased with the progress that she is made. She does wonder if we could switch to the bordered adhesive Hydrofera Blue I think that something we can definitely look into for her. 11/15/2019 patient on evaluation today appears to be making  excellent progress. Her wound is significantly smaller and the wound bed appears to be dramatically improved compared to even the last visit. Fortunately she is having really little to no pain at this time. No fevers, chills, nausea, vomiting, or diarrhea. 11/25/2019 on evaluation today patient appears to be doing well with regard to her lower extremity ulcer. She has been tolerating dressing changes without complication. Fortunately there is no signs of active infection at this time. No fevers, chills, nausea, vomiting, or diarrhea. 12/02/2019 on evaluation today patient unfortunately is not doing quite as well as what we would have liked to have seen based on last week's evaluation. Fortunately there is no signs of active infection at this time. With that being said she is continuing to experience some issues with hyper granulation she was doing much better with the [facility] prior to putting the contact layer in between her skin and the [facility] I think that we need to go back to that. She just can have to be very careful with removing the dressing in order to ensure it does not pull skin and get stuck. 12/16/2019 on evaluation today patient appears to be doing better her wound is measuring significantly smaller at this time compared to where things were previous. Fortunately there is no evidence of active infection at this point which is also good news. No fever chills noted. 12/23/2019 upon evaluation today patient appears to be doing excellent in regard to her lower extremity ulcer. She has been tolerating the dressing changes without complication. Fortunately there is no signs of active infection at this time. Overall I am very pleased with the fact that she is doing so well today. READMISSION 08/12/2020 [name] is a now 88 year old woman we have had in this clinic on at least 2 occasions. She spent a protracted period of time here for a postsurgical wound on her left olecranon and  then more recently towards the end of last year earlier this year was in for a skin tear on her left lower leg caused by her dog. She comes in today with a reasonably acute area on her left forearm again apparently caused by her dog although it was not a bite injury. She was seen in urgent care and they managed to reoppose a lot of the skin tear although the patient states that is now reopened slightly. They have been using Neosporin. She has very fragile skin probably a combination of chronic  steroid use for rheumatoid [name], [name] ([id number]) arthritis [although I do not see that she is on that now] solar skin damage. I note that she takes methotrexate and Plaquenil for her rheumatoid arthritis 9/15; this patient had a skin tear on her left forearm from trauma with her dog. She was seen in urgent care and they reapposed most of the separated skin. Fortunately for her most of this is remained intact and the skin above and below the wound actually looks healthy. Where there was a separation and the 2 flaps of skin there is still an open area however the granulation tissue looks healthy here. We have been using Xeroform however I had like to change to [facility] 9/29; skin tear on her left arm from a dog scratch. The skin was reapposed in an urgent care and surprisingly stayed viable. We used Hydrofera Blue last time the wound is smaller 10/13; patient comes back to clinic after 2 weeks for a left arm wound secondary to a dog scratch. The skin was reapposed and in urgent care. We have been using Hydrofera Blue and the wound is closed today. Readmission 02/17/2022 Ms. Voncille Simm is an 88 year old female with a past medical history of chronic venous insufficiency and lymphedema, COPD, rheumatoid arthritis and hypertension that presents with a 2-week history of wounds to her lower extremities bilaterally. She states she had a skin tag to the right posterior leg that came off and became a wound.  She states the area has slowly gotten larger. She was seen by her primary care physician's office on 02/15/2022 for this issue. She was prescribed Bactroban and cefdinir. She has 2 other wounds 1 located to each leg. These were from trauma. She states one was from trying to put on compression stockings. Her ABIs in office on the left was 0.93 and 1.0 on the right. She currently denies systemic signs of infection. 02/24/2022 upon evaluation today patient appears to be doing well currently in regard to her skin tears. In general I feel like she is making good progress. Fortunately I do not see any evidence of active infection locally or systemically which is great news. No fevers, chills, nausea, vomiting, or diarrhea. 03/04/2022 upon evaluation patient actually appears to be doing well in regard to the wounds on her legs. She has been tolerating the dressing changes without complication. Overall I think all are showing signs of improvement which is great news. 03/10/2022 upon evaluation today patient appears to be doing better in regard to her wounds in general. I am very pleased with where things stand. I do believe that she is showing signs of good improvement which is great news. No fevers, chills, nausea, vomiting, or diarrhea. 03-21-2022 upon evaluation today patient appears to be doing well with regard to her wound on the left leg and the anterior right leg both are showing signs of significant improvement. Unfortunately she has a lot of slough and biofilm buildup on the surface of the posterior wound which is not good. I do not see any signs of active infection locally or systemically which is great news. No fevers, chills, nausea, vomiting, or diarrhea. 03-29-2022 upon evaluation today patient appears to be doing well currently in regard to her wounds. She has been tolerating the dressing changes without complication. Fortunately there does not appear to be any signs of active infection at this time  which is great news. No fevers, chills, nausea, vomiting, or diarrhea. 4/27; right posterior calf and left lateral calf  both initially traumatic in the setting of significant venous insufficiency. She did not tolerate compression wraps apparently because she could not get shoes on her feet and therefore we have been using Tubigrip's and silver alginate 5/8; right posterior calf 100% covered with adherent debris. The area on the left lateral really looks quite good under illumination islands of new epithelialization. We have been using Xeroform on the left Iodoflex on the right bilateral 2 layer Tubigrip's 04-28-2022 upon evaluation today patient appears to be doing well with regard to her wound she has been tolerating the dressing changes which is great news. Overall I feel like the Xeroform is doing well on the left leg this is pretty much healed on the right leg she is very close to being healed as well I think we can probably switch over to Xeroform here as well. 05-05-2022 upon evaluation today patient appears to be doing excellent in regard to her wounds both are showing signs of being very close to complete resolution which is awesome news. Fortunately I do not see any signs of active infection at this time which is great news and in general I think we are headed in the right direction. 05-13-2022 upon evaluation today patient appears to be doing well with regard to her wounds. She however does have some breakdown due to the fact that I think is staying too moist on the left lateral aspect. The right is also staying a little bit too moist in my opinion. Subsequently I think that we may want to switch the dressings currently. I would recommend switching over to a silver cell dressing. 6/13; apparently the patient was felt to be having home health by [facility] therefore she did not receive supplies for her wound care. Current dressing remained on for for 5 days. Removed today by her intake nurse with  purulent drainage and the left lateral lower leg is considerably larger also slightly larger on the right. They are changing this usually every second day. At this point I am not really sure why the patient flagged as having home health 05-31-2022 upon evaluation today patient appears to be doing well with regard to her wounds in general. Fortunately I do not see any signs of active infection at this time systemically though locally she did have an infection last week and she is doing some better but was only able to take 3 days total of her medication. I will go ahead and get her sent in a prescription for doxycycline because the Bactrim was causing her stomach upset and I think probably the treatment matter is that she will benefit from at least 7 to 10 days of this doxycycline to make sure the infection is completely gone. 06-07-2022 upon evaluation today patient appears to be doing better. Her right leg is completely healed which is great news. The left leg is improving though not completely healed I think we are on the right track here still and I am very pleased with where things stand at this point. There does not appear to be any sign of infection at all at this time. 06-13-2022 upon evaluation today patient appears to be doing well with regard to her wound in fact this is showing signs of excellent improvement which is great news. Fortunately there does not appear to be any evidence of active infection locally or systemically at this time. No fevers, chills, nausea, vomiting, or diarrhea. 06-21-2022 upon evaluation today patient's wound on the right leg is still doing well the  left leg had a lot of dry skin trapping fluid up underneath. Fortunately there does not appear to be any evidence of infection locally or systemically at this time which is great news and overall very pleased in that regard. Nonetheless I do believe that we need to try to get some of this away so that the alginate can  collect the drainage. 06-30-2022 upon evaluation patient continues to have a wound that just seems to continue to morph and change as we progress here. I do not see any signs of infection systemically though locally I am seeing some issues here with erythema and minimal warmth I think that she could benefit [name], [name]. ([id number]) from topical antibiotic therapy possibly with Jodie Echevaria initially I will probably use gentamicin while we obtain a culture and ensure we know what exactly we are dealing with here. 7/27; patient with a wound on her left anterior lower leg she is complaining of pain although this does not look to be too much different from what our staff is used to seeing. The wounds are superficial and not draining. A culture was done of the left lower leg wound last week which showed MRSA. She is allergic to doxycycline and Bactrim interacts with methotrexate. I therefore prescribed linezolid" and E scribed that to pharmacy on 7/24. Unfortunately this was not communicated to the patient and I have expressed my regret about this.Fortunately I do not know the delay is caused any further difficulties Objective Constitutional Patient is hypertensive.. Pulse regular and within target range for patient.Marland Kitchen Respirations regular, non-labored and within target range.. Temperature is normal and within the target range for the patient.Marland Kitchen appears in no distress. No evidence of systemic illness. Vitals Time Taken: 1:24 PM, Height: 60 in, Weight: 132 lbs, BMI: 25.8, Temperature: 97.7 F, Pulse: 71 bpm, Respiratory Rate: 18 breaths/min, Blood Pressure: 154/57 mmHg. General Notes: Wound exam; right lateral lower leg wounds are superficial and actually look clean however she is quite tender especially inferiorly and laterally to the lower aspect of the wound. She is not obviously an imminent danger however I cannot rule out infection Integumentary (Hair, Skin) Wound #13 status is Open. Original cause  of wound was Trauma. The date acquired was: 02/09/2022. The wound has been in treatment 20 weeks. The wound is located on the Left Lower Leg. The wound measures 9cm length x 11cm width x 0.1cm depth; 77.754cm^2 area and 7.775cm^3 volume. There is Fat Layer (Subcutaneous Tissue) exposed. There is no tunneling or undermining noted. There is a medium amount of serosanguineous drainage noted. There is medium (34-66%) red granulation within the wound bed. There is a medium (34-66%) amount of necrotic tissue within the wound bed including Adherent Slough. Assessment Active Problems ICD-10 Laceration without foreign body, left lower leg, initial encounter Non-pressure chronic ulcer of other part of right lower leg with fat layer exposed Non-pressure chronic ulcer of other part of left lower leg with fat layer exposed Venous insufficiency (chronic) (peripheral) Methicillin susceptible Staphylococcus aureus infection as the cause of diseases classified elsewhere Plan Follow-up Appointments: Return Appointment in 1 week. Bathing/ Shower/ Hygiene: May shower; gently cleanse wound with antibacterial soap, rinse and pat dry prior to dressing wounds No tub bath. Anesthetic (Use 'Patient Medications' Section for Anesthetic Order Entry): Lidocaine applied to wound bed Edema Control - Lymphedema / Segmental Compressive Device / Other: Tubigrip double layer applied - size c Elevate, Exercise Daily and Avoid Standing for Long Periods of Time. Elevate legs to the level of  the heart and pump ankles as often as possible Elevate leg(s) parallel to the floor when sitting. Off-Loading: Turn and reposition every 2 hours - recommend prop legs/heels so wounds do not touch the bed/chair Additional Orders / Instructions: SHARONA, ROVNER ([id number]) Follow Nutritious Diet and Increase Protein Intake WOUND #13: - Lower Leg Wound Laterality: Left Cleanser: Soap and Water Every Other Day/30 Days Discharge  Instructions: Gently cleanse wound with antibacterial soap, rinse and pat dry prior to dressing wounds Topical: Gentamicin Every Other Day/30 Days Discharge Instructions: Apply thin layer to wound as directed by provider. Primary Dressing: Cutimed Sorbact 1.5x 2.38 (in/in) (Generic) Every Other Day/30 Days Discharge Instructions: A bacteria- and fungi binding wound dressing, suitable for cavities and fistulas. It is suitable as a wound filler and allows the passage of wound exudate into a secondary dressing. The dressing helps reducing odor and pain and can improve healing. Secondary Dressing: Zetuvit Plus 4x8 (in/in) (Generic) Every Other Day/30 Days 1. I did not alter the primary dressing which is gentamicin with Sorbact and Zetuvit 2I have E scribed linezolid 600 twice daily for 7 days Electronic Signature(s) Signed: 07/07/2022 4:27:18 PM By: Baltazar Najjar MD Entered By: Baltazar Najjar on 07/07/2022 14:22:53 [name] ([id number]) -------------------------------------------------------------------------------- SuperBill Details Patient Name: [name] Date of Service: 07/07/2022 Medical Record Number: [id number] Patient Account Number: [id number] Date of Birth/Sex: [birth year] (88 y.o. F) Treating RN: Yevonne Pax Primary Care Provider: Aram Beecham Other Clinician: Referring Provider: Aram Beecham Treating Provider/Extender: Altamese Kamiah in Treatment: 20 Diagnosis Coding ICD-10 Codes Code Description [phone] Laceration without foreign body, left lower leg, initial encounter S81.811A Laceration without foreign body, right lower leg, initial encounter L97.812 Non-pressure chronic ulcer of other part of right lower leg with fat layer exposed L97.822 Non-pressure chronic ulcer of other part of left lower leg with fat layer exposed I87.2 Venous insufficiency (chronic) (peripheral) Facility Procedures CPT4 Code: [id number] Description: 25366 - WOUND CARE  VISIT-LEV 2 EST PT Modifier: Quantity: 1 Electronic Signature(s) Signed: 07/07/2022 4:27:18 PM By: Baltazar Najjar MD Signed: 07/07/2022 4:52:51 PM By: Yevonne Pax RN Entered By: Yevonne Pax on 07/07/2022 13:59:05

## 2022-07-07 NOTE — Progress Notes (Signed)
[name], [name] ([id number]) Visit Report for 07/07/2022 Arrival Information Details Patient Name: [name], [name]. Date of Service: 07/07/2022 1:15 PM Medical Record Number: [id number] Patient Account Number: [id number] Date of Birth/Sex: 1930-05-05 (88 y.o. F) Treating RN: Carlene Coria Primary Care Jaskirat Schwieger: Fulton Reek Other Clinician: Referring Sanad Fearnow: Fulton Reek Treating Amillion Macchia/Extender: Tito Dine in Treatment: 13 Visit Information History Since Last Visit All ordered tests and consults were completed: No Patient Arrived: Wheel Chair Added or deleted any medications: No Arrival Time: 13:19 Any new allergies or adverse reactions: No Accompanied By: caregiver Had a fall or experienced change in No Transfer Assistance: None activities of daily living that may affect Patient Identification Verified: Yes risk of falls: Secondary Verification Process Completed: Yes Signs or symptoms of abuse/neglect since last visito No Patient Requires Transmission-Based Precautions: No Hospitalized since last visit: No Patient Has Alerts: Yes Implantable device outside of the clinic excluding No Patient Alerts: NOT diabetic cellular tissue based products placed in the center since last visit: Has Dressing in Place as Prescribed: Yes Has Compression in Place as Prescribed: Yes Pain Present Now: No Electronic Signature(s) Signed: 07/07/2022 4:52:51 PM By: Carlene Coria RN Entered By: Carlene Coria on 07/07/2022 13:24:35 [name] ([id number]) -------------------------------------------------------------------------------- Clinic Level of Care Assessment Details Patient Name: [name] Date of Service: 07/07/2022 1:15 PM Medical Record Number: [id number] Patient Account Number: [id number] Date of Birth/Sex: Sep 28, 1930 (88 y.o. F) Treating RN: Carlene Coria Primary Care Deshanna Kama: Fulton Reek Other Clinician: Referring Crystal Scarberry: Fulton Reek Treating  Alexsys Eskin/Extender: Tito Dine in Treatment: 20 Clinic Level of Care Assessment Items TOOL 4 Quantity Score X - Use when only an EandM is performed on FOLLOW-UP visit 1 0 ASSESSMENTS - Nursing Assessment / Reassessment X - Reassessment of Co-morbidities (includes updates in patient status) 1 10 X- 1 5 Reassessment of Adherence to Treatment Plan ASSESSMENTS - Wound and Skin Assessment / Reassessment X - Simple Wound Assessment / Reassessment - one wound 1 5 '[]'  - 0 Complex Wound Assessment / Reassessment - multiple wounds '[]'  - 0 Dermatologic / Skin Assessment (not related to wound area) ASSESSMENTS - Focused Assessment '[]'  - Circumferential Edema Measurements - multi extremities 0 '[]'  - 0 Nutritional Assessment / Counseling / Intervention '[]'  - 0 Lower Extremity Assessment (monofilament, tuning fork, pulses) '[]'  - 0 Peripheral Arterial Disease Assessment (using hand held doppler) ASSESSMENTS - Ostomy and/or Continence Assessment and Care '[]'  - Incontinence Assessment and Management 0 '[]'  - 0 Ostomy Care Assessment and Management (repouching, etc.) PROCESS - Coordination of Care X - Simple Patient / Family Education for ongoing care 1 15 '[]'  - 0 Complex (extensive) Patient / Family Education for ongoing care '[]'  - 0 Staff obtains Programmer, systems, Records, Test Results / Process Orders '[]'  - 0 Staff telephones HHA, Nursing Homes / Clarify orders / etc '[]'  - 0 Routine Transfer to another Facility (non-emergent condition) '[]'  - 0 Routine Hospital Admission (non-emergent condition) '[]'  - 0 New Admissions / Biomedical engineer / Ordering NPWT, Apligraf, etc. '[]'  - 0 Emergency Hospital Admission (emergent condition) X- 1 10 Simple Discharge Coordination '[]'  - 0 Complex (extensive) Discharge Coordination PROCESS - Special Needs '[]'  - Pediatric / Minor Patient Management 0 '[]'  - 0 Isolation Patient Management '[]'  - 0 Hearing / Language / Visual special needs '[]'  - 0 Assessment of  Community assistance (transportation, D/C planning, etc.) '[]'  - 0 Additional assistance / Altered mentation '[]'  - 0 Support Surface(s) Assessment (bed, cushion, seat, etc.) INTERVENTIONS - Wound  Cleansing / Measurement [name], [name] ([id number]) X- 1 5 Simple Wound Cleansing - one wound '[]'  - 0 Complex Wound Cleansing - multiple wounds X- 1 5 Wound Imaging (photographs - any number of wounds) '[]'  - 0 Wound Tracing (instead of photographs) X- 1 5 Simple Wound Measurement - one wound '[]'  - 0 Complex Wound Measurement - multiple wounds INTERVENTIONS - Wound Dressings X - Small Wound Dressing one or multiple wounds 1 10 '[]'  - 0 Medium Wound Dressing one or multiple wounds '[]'  - 0 Large Wound Dressing one or multiple wounds '[]'  - 0 Application of Medications - topical '[]'  - 0 Application of Medications - injection INTERVENTIONS - Miscellaneous '[]'  - External ear exam 0 '[]'  - 0 Specimen Collection (cultures, biopsies, blood, body fluids, etc.) '[]'  - 0 Specimen(s) / Culture(s) sent or taken to Lab for analysis '[]'  - 0 Patient Transfer (multiple staff / Civil Service fast streamer / Similar devices) '[]'  - 0 Simple Staple / Suture removal (25 or less) '[]'  - 0 Complex Staple / Suture removal (26 or more) '[]'  - 0 Hypo / Hyperglycemic Management (close monitor of Blood Glucose) '[]'  - 0 Ankle / Brachial Index (ABI) - do not check if billed separately X- 1 5 Vital Signs Has the patient been seen at the hospital within the last three years: Yes Total Score: 75 Level Of Care: New/Established - Level 2 Electronic Signature(s) Signed: 07/07/2022 4:52:51 PM By: Carlene Coria RN Entered By: Carlene Coria on 07/07/2022 13:58:59 [name] ([id number]) -------------------------------------------------------------------------------- Encounter Discharge Information Details Patient Name: [name] Date of Service: 07/07/2022 1:15 PM Medical Record Number: [id number] Patient Account Number: [id number] Date  of Birth/Sex: 05-22-1930 (88 y.o. F) Treating RN: Carlene Coria Primary Care Joakim Huesman: Fulton Reek Other Clinician: Referring Kahlil Cowans: Fulton Reek Treating Raffaella Edison/Extender: Tito Dine in Treatment: 20 Encounter Discharge Information Items Discharge Condition: Stable Ambulatory Status: Ambulatory Discharge Destination: Home Transportation: Private Auto Accompanied By: self Schedule Follow-up Appointment: Yes Clinical Summary of Care: Electronic Signature(s) Signed: 07/07/2022 4:52:51 PM By: Carlene Coria RN Entered By: Carlene Coria on 07/07/2022 14:00:01 [name] ([id number]) -------------------------------------------------------------------------------- Lower Extremity Assessment Details Patient Name: [name] Date of Service: 07/07/2022 1:15 PM Medical Record Number: [id number] Patient Account Number: [id number] Date of Birth/Sex: [birth year] (88 y.o. F) Treating RN: Carlene Coria Primary Care Amery Vandenbos: Fulton Reek Other Clinician: Referring Galen Russman: Fulton Reek Treating Kashena Novitski/Extender: Tito Dine in Treatment: 20 Edema Assessment Assessed: [Left: No] [Right: No] Edema: [Left: Ye] [Right: s] Calf Left: Right: Point of Measurement: 32 cm From Medial Instep 35 cm Ankle Left: Right: Point of Measurement: 10 cm From Medial Instep 23.4 cm Vascular Assessment Pulses: Dorsalis Pedis Palpable: [Left:Yes] Electronic Signature(s) Signed: 07/07/2022 4:52:51 PM By: Carlene Coria RN Entered By: Carlene Coria on 07/07/2022 13:33:10 [name] ([id number]) -------------------------------------------------------------------------------- Multi Wound Chart Details Patient Name: [name] Date of Service: 07/07/2022 1:15 PM Medical Record Number: [id number] Patient Account Number: [id number] Date of Birth/Sex: [birth year] (88 y.o. F) Treating RN: Carlene Coria Primary Care Nowell Sites: Fulton Reek Other  Clinician: Referring Judine Arciniega: Fulton Reek Treating Hanna Aultman/Extender: Tito Dine in Treatment: 20 Vital Signs Height(in): 60 Pulse(bpm): 61 Weight(lbs): 132 Blood Pressure(mmHg): 154/57 Body Mass Index(BMI): 25.8 Temperature(F): 97.7 Respiratory Rate(breaths/min): 18 Photos: [N/A:N/A] Wound Location: Left Lower Leg N/A N/A Wounding Event: Trauma N/A N/A Primary Etiology: Skin Tear N/A N/A Comorbid History: Cataracts, Glaucoma, Optic N/A N/A Neuritis, Chronic sinus problems/congestion, Middle ear problems, Hypertension, Rheumatoid Arthritis Date Acquired: 02/09/2022 N/A  N/A Weeks of Treatment: 20 N/A N/A Wound Status: Open N/A N/A Wound Recurrence: No N/A N/A Measurements L x W x D (cm) 9x11x0.1 N/A N/A Area (cm) : 77.754 N/A N/A Volume (cm) : 7.775 N/A N/A % Reduction in Area: -4975.30% N/A N/A % Reduction in Volume: -4981.70% N/A N/A Classification: Full Thickness Without Exposed N/A N/A Support Structures Exudate Amount: Medium N/A N/A Exudate Type: Serosanguineous N/A N/A Exudate Color: red, brown N/A N/A Granulation Amount: Medium (34-66%) N/A N/A Granulation Quality: Red N/A N/A Necrotic Amount: Medium (34-66%) N/A N/A Exposed Structures: Fat Layer (Subcutaneous Tissue): N/A N/A Yes Fascia: No Tendon: No Muscle: No Joint: No Bone: No Epithelialization: None N/A N/A Treatment Notes Electronic Signature(s) Signed: 07/07/2022 4:52:51 PM By: Carlene Coria RN Entered By: Carlene Coria on 07/07/2022 13:34:39 [name] ([id number]) [name] ([id number]) -------------------------------------------------------------------------------- Multi-Disciplinary Care Plan Details Patient Name: [name] Date of Service: 07/07/2022 1:15 PM Medical Record Number: [id number] Patient Account Number: [id number] Date of Birth/Sex: 05-19-1930 (88 y.o. F) Treating RN: Carlene Coria Primary Care Mahlon Gabrielle: Fulton Reek Other  Clinician: Referring Chestine Belknap: Fulton Reek Treating Punam Broussard/Extender: Tito Dine in Treatment: 20 Active Inactive Necrotic Tissue Nursing Diagnoses: Impaired tissue integrity related to necrotic/devitalized tissue Knowledge deficit related to management of necrotic/devitalized tissue Goals: Necrotic/devitalized tissue will be minimized in the wound bed Date Initiated: 06/21/2022 Target Resolution Date: 07/22/2022 Goal Status: Active Patient/caregiver will verbalize understanding of reason and process for debridement of necrotic tissue Date Initiated: 06/21/2022 Date Inactivated: 07/07/2022 Target Resolution Date: 07/22/2022 Goal Status: Met Interventions: Assess patient pain level pre-, during and post procedure and prior to discharge Provide education on necrotic tissue and debridement process Treatment Activities: Excisional debridement : 06/21/2022 Notes: Electronic Signature(s) Signed: 07/07/2022 4:52:51 PM By: Carlene Coria RN Entered By: Carlene Coria on 07/07/2022 13:34:30 [name] ([id number]) -------------------------------------------------------------------------------- Pain Assessment Details Patient Name: [name] Date of Service: 07/07/2022 1:15 PM Medical Record Number: [id number] Patient Account Number: [id number] Date of Birth/Sex: 05-09-1930 (88 y.o. F) Treating RN: Carlene Coria Primary Care Zlaty Alexa: Fulton Reek Other Clinician: Referring Gailya Tauer: Fulton Reek Treating Dorr Perrot/Extender: Tito Dine in Treatment: 20 Active Problems Location of Pain Severity and Description of Pain Patient Has Paino No Site Locations Pain Management and Medication Current Pain Management: Electronic Signature(s) Signed: 07/07/2022 4:52:51 PM By: Carlene Coria RN Entered By: Carlene Coria on 07/07/2022 13:31:02 [name]  ([id number]) -------------------------------------------------------------------------------- Patient/Caregiver Education Details Patient Name: [name] Date of Service: 07/07/2022 1:15 PM Medical Record Number: [id number] Patient Account Number: [id number] Date of Birth/Gender: 11-13-1930 (88 y.o. F) Treating RN: Carlene Coria Primary Care Physician: Fulton Reek Other Clinician: Referring Physician: Fulton Reek Treating Physician/Extender: Tito Dine in Treatment: 20 Education Assessment Education Provided To: Patient Education Topics Provided Wound Debridement: Methods: Explain/Verbal Responses: State content correctly Electronic Signature(s) Signed: 07/07/2022 4:52:51 PM By: Carlene Coria RN Entered By: Carlene Coria on 07/07/2022 13:59:16 [name] ([id number]) -------------------------------------------------------------------------------- Wound Assessment Details Patient Name: [name] Date of Service: 07/07/2022 1:15 PM Medical Record Number: [id number] Patient Account Number: [id number] Date of Birth/Sex: June 07, 1930 (88 y.o. F) Treating RN: Carlene Coria Primary Care Christia Domke: Fulton Reek Other Clinician: Referring Susane Bey: Fulton Reek Treating Bralyn Espino/Extender: Tito Dine in Treatment: 20 Wound Status Wound Number: 13 Primary Skin Tear Etiology: Wound Location: Left Lower Leg Wound Open Wounding Event: Trauma Status: Date Acquired: 02/09/2022 Comorbid Cataracts, Glaucoma, Optic Neuritis, Chronic sinus Weeks Of Treatment: 20 History: problems/congestion, Middle  ear problems, Hypertension, Clustered Wound: No Rheumatoid Arthritis Photos Wound Measurements Length: (cm) 9 Width: (cm) 11 Depth: (cm) 0.1 Area: (cm) 77.754 Volume: (cm) 7.775 % Reduction in Area: -4975.3% % Reduction in Volume: -4981.7% Epithelialization: None Tunneling: No Undermining: No Wound Description Classification: Full  Thickness Without Exposed Support Structures Exudate Amount: Medium Exudate Type: Serosanguineous Exudate Color: red, brown Foul Odor After Cleansing: No Slough/Fibrino Yes Wound Bed Granulation Amount: Medium (34-66%) Exposed Structure Granulation Quality: Red Fascia Exposed: No Necrotic Amount: Medium (34-66%) Fat Layer (Subcutaneous Tissue) Exposed: Yes Necrotic Quality: Adherent Slough Tendon Exposed: No Muscle Exposed: No Joint Exposed: No Bone Exposed: No Treatment Notes Wound #13 (Lower Leg) Wound Laterality: Left Cleanser Soap and Water Discharge Instruction: Gently cleanse wound with antibacterial soap, rinse and pat dry prior to dressing wounds Coffee ([id number]) Topical Gentamicin Discharge Instruction: Apply thin layer to wound as directed by Hezikiah Retzloff. Primary Dressing Cutimed Sorbact 1.5x 2.38 (in/in) Discharge Instruction: A bacteria- and fungi binding wound dressing, suitable for cavities and fistulas. It is suitable as a wound filler and allows the passage of wound exudate into a secondary dressing. The dressing helps reducing odor and pain and can improve healing. Secondary Dressing Zetuvit Plus 4x8 (in/in) Secured With Compression Wrap Compression Stockings Add-Ons Electronic Signature(s) Signed: 07/07/2022 4:52:51 PM By: Carlene Coria RN Entered By: Carlene Coria on 07/07/2022 13:32:08 [name] ([id number]) -------------------------------------------------------------------------------- [place] Details Patient Name: [name] Date of Service: 07/07/2022 1:15 PM Medical Record Number: [id number] Patient Account Number: [id number] Date of Birth/Sex: Oct 14, 1930 (88 y.o. F) Treating RN: Carlene Coria Primary Care Shannin Naab: Fulton Reek Other Clinician: Referring Laina Guerrieri: Fulton Reek Treating Reyaan Thoma/Extender: Tito Dine in Treatment: 20 Vital Signs Time Taken: 13:24 Temperature (F):  97.7 Height (in): 60 Pulse (bpm): 71 Weight (lbs): 132 Respiratory Rate (breaths/min): 18 Body Mass Index (BMI): 25.8 Blood Pressure (mmHg): 154/57 Reference Range: 80 - 120 mg / dl Electronic Signature(s) Signed: 07/07/2022 4:52:51 PM By: Carlene Coria RN Entered By: Carlene Coria on 07/07/2022 13:24:59

## 2022-07-15 ENCOUNTER — Ambulatory Visit: Payer: Medicare Other | Admitting: Physician Assistant

## 2022-07-21 ENCOUNTER — Encounter: Payer: Medicare Other | Attending: Physician Assistant | Admitting: Physician Assistant

## 2022-07-21 DIAGNOSIS — I872 Venous insufficiency (chronic) (peripheral): Secondary | ICD-10-CM | POA: Insufficient documentation

## 2022-07-21 DIAGNOSIS — B9561 Methicillin susceptible Staphylococcus aureus infection as the cause of diseases classified elsewhere: Secondary | ICD-10-CM | POA: Insufficient documentation

## 2022-07-21 DIAGNOSIS — L97812 Non-pressure chronic ulcer of other part of right lower leg with fat layer exposed: Secondary | ICD-10-CM | POA: Insufficient documentation

## 2022-07-21 DIAGNOSIS — L97822 Non-pressure chronic ulcer of other part of left lower leg with fat layer exposed: Secondary | ICD-10-CM | POA: Diagnosis not present

## 2022-07-21 NOTE — Progress Notes (Addendum)
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 to see if this can be of benefit. Fortunately there is no signs of active infection at this point. 07/18/19 upon evaluation today patient appears to be doing better with regard to her ulcer on her right lower extremity. She's been tolerating the dressing changes without complication. The good news is she seems to be making excellent progress. Overall I'm pleased with there being no signs of infection. With that being said she does tell me that she's been having some discomfort with the wrap she's unsure of exactly what about the rafters causing her trouble. 07/25/2019 on evaluation today patient appears to be doing a little better in regard to her lower extremity ulcer. Unfortunately the alginate seems to be getting really stuck to the wound bed and surrounding periwound. Fortunately there is no  signs of active infection at this time. There does not appear to be any evidence of infection currently. 08/01/2019 on evaluation today patient actually appears to be doing much better with regard to her right lower extremity ulcer. She has been tolerating the dressing changes without complication. Fortunately her wound does not show any signs of infection and seems to be making good progress. No fevers, chills, nausea, vomiting, or  diarrhea. 08/08/2019 on evaluation today patient actually appears to be doing better with regard to her leg ulcer. She has been tolerating the dressing changes without complication. With that being said I think we may want to switch the dressing up today just based on the appearance of the wound bed in general. Fortunately there is no evidence of infection currently. Her pain seems to be much better. Her son is present with her [name], [name] ([id number]) today. 08/15/2019 on evaluation today patient appears to be doing very well with regard to her right lower extremity ulcer. She still complains about feeling like the compression stocking/Tubigrip is too tight for her. Nonetheless I still think this is beneficial and is helping the wound to heal more effectively. Overall I am extremely pleased at this time with what I am seeing. 08/22/2019 on evaluation today patient actually appears to be doing quite well with regard to her right lower extremity. She has been tolerating the dressing changes without complication. Fortunately there is no signs of active infection at this time. She still complains about the Tubigrip but nonetheless I think this is something that is of utmost importance for her to continue to wear if she is going to see this area heal appropriately. 08/29/2019 on evaluation today patient actually appears to be doing well visually in regard to her wound. Unfortunately in regard to overall pain she seems to be having more pain today which I am somewhat concerned about. There does not appear to be any signs of active infection that I can tell but again with increased pain that is definitely a concern here today. I do think it may be time to switch up the dressing as well to something that will be a little bit more effective hopefully and new tissue growth she has done well with collagen in the past. 09/05/2019 on evaluation today patient actually appears to be doing well with regard to her leg  ulcer. She has been tolerating the dressing changes without complication. Fortunately there is no signs of active infection. We did obtain a wound culture last week due to the fact that she still is very touchy as far as the surface of the wound is concerned. Unfortunately I am not sure what happened to that culture as there is no record in the computer system. Nonetheless we need to follow-up on this and try to see if we can identify what exactly happened with this specimen as we have not gotten a result back either obviously. 09/12/2019 on evaluation today patient actually appears to be showing signs of good improvement upon evaluation today. She states her pain is not nearly as bad as what it was which is also good news. She did have a wound culture which was reviewed today that showed no growth of bacteria which is good news. This coupled with the fact that her wound appears to be healing better, measuring smaller, and overall even the slough buildup is minimal compared to what we have seen previously, I feel like she is actually showing signs of excellent improvement today. 09/19/2019 on evaluation  today patient appears to be doing well with regard to her wounds on evaluation today. She is showing signs of continued improvement week by week and everything is measuring significantly smaller which is great news. Overall very pleased with the progress that she has made. 09/26/2019 on evaluation today patient appears to be doing fairly well with regard to her right lower extremity ulcer. She does continue to have some drainage and again the smaller of the 2 wounds is not making as much progress as I like to see. We have been doing the silver collagen for some time I think we may want to switch to [facility] she has a little bit of hyper granular tissue this may help to keep things from becoming too dramatic in that regard. Fortunately there is no signs of infection at this time. 10/03/2019 on  evaluation today patient appears to be doing fairly well with regard to her lower extremity ulcers at this time. She has been tolerating the dressing changes without complication. Fortunately the wound is measuring smaller which is great news she continues to make great progress. 10/17/2019 on evaluation today patient appears to be doing well with regard to her wounds of her right lower extremity. She states she has been having some increased pain apparently we gave her a little bit smaller size Tubigrip last time she was here this may have been what was causing some issues. She is out of the Tubigrip that we had ordered for her and therefore did not have any of the smaller size. Fortunately there is no evidence of active infection at this time. No fever chills noted 10/24/2019 upon evaluation today patient's wounds actually appear to be showing signs of good improvement which is excellent news. She has been tolerating the dressing changes without complication and overall I am rather pleased at this point with how things seem to be progressing. The patient likewise is very happy. She still was not happy with having to use the Tubigrip she states that bunches up in her shoe. 10/31/2019 on evaluation today patient actually appears to be doing quite well with regard to her leg ulcer. She has been tolerating the dressing changes without complication. Fortunately there is no signs of active infection at this time. Overall I am very pleased with the progress that she is made. She does wonder if we could switch to the bordered adhesive Hydrofera Blue I think that something we can definitely look into for her. 11/15/2019 patient on evaluation today appears to be making excellent progress. Her wound is significantly smaller and the wound bed appears to be dramatically improved compared to even the last visit. Fortunately she is having really little to no pain at this time. No fevers, chills, nausea, vomiting, or  diarrhea. 11/25/2019 on evaluation today patient appears to be doing well with regard to her lower extremity ulcer. She has been tolerating dressing changes without complication. Fortunately there is no signs of active infection at this time. No fevers, chills, nausea, vomiting, or diarrhea. 12/02/2019 on evaluation today patient unfortunately is not doing quite as well as what we would have liked to have seen based on last week's evaluation. Fortunately there is no signs of active infection at this time. With that being said she is continuing to experience some issues with hyper granulation she was doing much better with the [facility] prior to putting the contact layer in between her skin and the [facility] I think that we need to go back to that.  She just can have to be very careful with removing the dressing in order to ensure it does not pull skin and get stuck. 12/16/2019 on evaluation today patient appears to be doing better her wound is measuring significantly smaller at this time compared to where things were previous. Fortunately there is no evidence of active infection at this point which is also good news. No fever chills noted. 12/23/2019 upon evaluation today patient appears to be doing excellent in regard to her lower extremity ulcer. She has been tolerating the dressing changes without complication. Fortunately there is no signs of active infection at this time. Overall I am very pleased with the fact that she is doing so well today. READMISSION 08/12/2020 Mrs. Joshi is a now 88 year old woman we have had in this clinic on at least 2 occasions. She spent a protracted period of time here for a postsurgical wound on her left olecranon and then more recently towards the end of last year earlier this year was in for a skin tear on her left lower leg caused by her dog. She comes in today with a reasonably acute area on her left forearm again apparently caused by her dog although  it was not a bite injury. She was seen in urgent care and they managed to reoppose a lot of the skin tear although the patient states that is now reopened slightly. They have been using Neosporin. She has very fragile skin probably a combination of chronic steroid use for rheumatoid SALISHA, BARDSLEY ([id number]) arthritis [although I do not see that she is on that now] solar skin damage. I note that she takes methotrexate and Plaquenil for her rheumatoid arthritis 9/15; this patient had a skin tear on her left forearm from trauma with her dog. She was seen in urgent care and they reapposed most of the separated skin. Fortunately for her most of this is remained intact and the skin above and below the wound actually looks healthy. Where there was a separation and the 2 flaps of skin there is still an open area however the granulation tissue looks healthy here. We have been using Xeroform however I had like to change to [facility] 9/29; skin tear on her left arm from a dog scratch. The skin was reapposed in an urgent care and surprisingly stayed viable. We used Hydrofera Blue last time the wound is smaller 10/13; patient comes back to clinic after 2 weeks for a left arm wound secondary to a dog scratch. The skin was reapposed and in urgent care. We have been using Hydrofera Blue and the wound is closed today. Readmissiono3/08/2022 Ms. Khalise Billard is an 88 year old female with a past medical history of chronic venous insufficiency and lymphedema, COPD, rheumatoid arthritis and hypertension that presents with a 2-week history of wounds to her lower extremities bilaterally. She states she had a skin tag to the right posterior leg that came off and became a wound. She states the area has slowly gotten larger. She was seen by her primary care physician's office on 02/15/2022 for this issue. She was prescribed Bactroban and cefdinir. She has 2 other wounds 1 located to each leg. These were from trauma.  She states one was from trying to put on compression stockings. Her ABIs in office on the left was 0.93 and 1.0 on the right. She currently denies systemic signs of infection. 02/24/2022 upon evaluation today patient appears to be doing well currently in regard to her skin tears. In general I feel  like she is making good progress. Fortunately I do not see any evidence of active infection locally or systemically which is great news. No fevers, chills, nausea, vomiting, or diarrhea. 03/04/2022 upon evaluation patient actually appears to be doing well in regard to the wounds on her legs. She has been tolerating the dressing changes without complication. Overall I think all are showing signs of improvement which is great news. 03/10/2022 upon evaluation today patient appears to be doing better in regard to her wounds in general. I am very pleased with where things stand. I do believe that she is showing signs of good improvement which is great news. No fevers, chills, nausea, vomiting, or diarrhea. 03-21-2022 upon evaluation today patient appears to be doing well with regard to her wound on the left leg and the anterior right leg both are showing signs of significant improvement. Unfortunately she has a lot of slough and biofilm buildup on the surface of the posterior wound which is not good. I do not see any signs of active infection locally or systemically which is great news. No fevers, chills, nausea, vomiting, or diarrhea. 03-29-2022 upon evaluation today patient appears to be doing well currently in regard to her wounds. She has been tolerating the dressing changes without complication. Fortunately there does not appear to be any signs of active infection at this time which is great news. No fevers, chills, nausea, vomiting, or diarrhea. 4/27; right posterior calf and left lateral calf both initially traumatic in the setting of significant venous insufficiency. She did not tolerate compression wraps  apparently because she could not get shoes on her feet and therefore we have been using Tubigrip's and silver alginate 5/8; right posterior calf 100% covered with adherent debris. The area on the left lateral really looks quite good under illumination islands of new epithelialization. We have been using Xeroform on the left Iodoflex on the right bilateral 2 layer Tubigrip's 04-28-2022 upon evaluation today patient appears to be doing well with regard to her wound she has been tolerating the dressing changes which is great news. Overall I feel like the Xeroform is doing well on the left leg this is pretty much healed on the right leg she is very close to being healed as well I think we can probably switch over to Xeroform here as well. 05-05-2022 upon evaluation today patient appears to be doing excellent in regard to her wounds both are showing signs of being very close to complete resolution which is awesome news. Fortunately I do not see any signs of active infection at this time which is great news and in general I think we are headed in the right direction. 05-13-2022 upon evaluation today patient appears to be doing well with regard to her wounds. She however does have some breakdown due to the fact that I think is staying too moist on the left lateral aspect. The right is also staying a little bit too moist in my opinion. Subsequently I think that we may want to switch the dressings currently. I would recommend switching over to a silver cell dressing. 6/13; apparently the patient was felt to be having home health by [facility] therefore she did not receive supplies for her wound care. Current dressing remained on for for 5 days. Removed today by her intake nurse with purulent drainage and the left lateral lower leg is considerably larger also slightly larger on the right. They are changing this usually every second day. At this point I am not really sure  why the patient flagged as having home  health 05-31-2022 upon evaluation today patient appears to be doing well with regard to her wounds in general. Fortunately I do not see any signs of active infection at this time systemically though locally she did have an infection last week and she is doing some better but was only able to take 3 days total of her medication. I will go ahead and get her sent in a prescription for doxycycline because the Bactrim was causing her stomach upset and I think probably the treatment matter is that she will benefit from at least 7 to 10 days of this doxycycline to make sure the infection is completely gone. 06-07-2022 upon evaluation today patient appears to be doing better. Her right leg is completely healed which is great news. The left leg is improving though not completely healed I think we are on the right track here still and I am very pleased with where things stand at this point. There does not appear to be any sign of infection at all at this time. 06-13-2022 upon evaluation today patient appears to be doing well with regard to her wound in fact this is showing signs of excellent improvement which is great news. Fortunately there does not appear to be any evidence of active infection locally or systemically at this time. No fevers, chills, nausea, vomiting, or diarrhea. 06-21-2022 upon evaluation today patient's wound on the right leg is still doing well the left leg had a lot of dry skin trapping fluid up underneath. Fortunately there does not appear to be any evidence of infection locally or systemically at this time which is great news and overall very pleased in that regard. Nonetheless I do believe that we need to try to get some of this away so that the alginate can collect the drainage. 06-30-2022 upon evaluation patient continues to have a wound that just seems to continue to morph and change as we progress here. I do not see any signs of infection systemically though locally I am seeing some  issues here with erythema and minimal warmth I think that she could benefit [name], [name]. ([id number]) from topical antibiotic therapy possibly with [employer] initially I will probably use gentamicin while we obtain a culture and ensure we know what exactly we are dealing with here. 7/27; patient with a wound on her left anterior lower leg she is complaining of pain although this does not look to be too much different from what our staff is used to seeing. The wounds are superficial and not draining. A culture was done of the left lower leg wound last week which showed MRSA. She is allergic to doxycycline and Bactrim interacts with methotrexate. I therefore prescribed linezolid" and E scribed that to pharmacy on 7/24. Unfortunately this was not communicated to the patient and I have expressed my regret about this.Fortunately I do not know the delay is caused any further difficulties 07-21-2022 upon evaluation today patient appears to be doing well currently in regard to the wound on her left leg. This is pretty much about completely healed which is great news I do believe the linezolid following the culture was good for her and has made a big difference here. Fortunately I do not see any signs of active infection locally or systemically at this time which is great news. No fevers, chills, nausea, vomiting, or diarrhea. Electronic Signature(s) Signed: 07/21/2022 3:04:28 PM By: Worthy Keeler PA-C Entered By: Worthy Keeler on 07/21/2022 15:04:28  [name], [name] ([id number]) -------------------------------------------------------------------------------- Physical Exam Details Patient Name: FELICITI, GENTRY Date of Service: 07/21/2022 2:15 PM Medical Record Number: [id number] Patient Account Number: [id number] Date of Birth/Sex: 02-04-1930 (88 y.o. F) Treating RN: Cornell Barman Primary Care Provider: Fulton Reek Other Clinician: Massie Kluver Referring Provider: Fulton Reek Treating  Provider/Extender: Skipper Cliche in Treatment: 19 Constitutional Well-nourished and well-hydrated in no acute distress. Respiratory normal breathing without difficulty. Psychiatric this patient is able to make decisions and demonstrates good insight into disease process. Alert and Oriented x 3. pleasant and cooperative. Notes Upon inspection patient's wound again showed signs of almost complete epithelization in regard to the left leg. On the right leg she does have a very small area where she had a tiny skin tear fortunately there is nothing too severe going on here and I do believe that this is pretty much still back up there is not even really an open wound at this point is more of a bruise that is where the patient snagged herself with her fingernail. Since her fingernails have been cut which I do believe is good news. Electronic Signature(s) Signed: 07/21/2022 3:04:59 PM By: Worthy Keeler PA-C Entered By: Worthy Keeler on 07/21/2022 15:04:59 [name] ([id number]) -------------------------------------------------------------------------------- Physician Orders Details Patient Name: [name] Date of Service: 07/21/2022 2:15 PM Medical Record Number: [id number] Patient Account Number: [id number] Date of Birth/Sex: 1930-11-16 (88 y.o. F) Treating RN: Cornell Barman Primary Care Provider: Fulton Reek Other Clinician: Massie Kluver Referring Provider: Fulton Reek Treating Provider/Extender: Skipper Cliche in Treatment: 22 Verbal / Phone Orders: No Diagnosis Coding ICD-10 Coding Code Description [phone] Laceration without foreign body, left lower leg, initial encounter L97.812 Non-pressure chronic ulcer of other part of right lower leg with fat layer exposed L97.822 Non-pressure chronic ulcer of other part of left lower leg with fat layer exposed I87.2 Venous insufficiency (chronic) (peripheral) B95.61 Methicillin susceptible Staphylococcus aureus  infection as the cause of diseases classified elsewhere Follow-up Appointments o Return Appointment in 1 week. Bathing/ Shower/ Hygiene o May shower; gently cleanse wound with antibacterial soap, rinse and pat dry prior to dressing wounds o No tub bath. Anesthetic (Use 'Patient Medications' Section for Anesthetic Order Entry) o Lidocaine applied to wound bed Edema Control - Lymphedema / Segmental Compressive Device / Other o Tubigrip double layer applied - size c o Elevate, Exercise Daily and Avoid Standing for Long Periods of Time. o Elevate legs to the level of the heart and pump ankles as often as possible o Elevate leg(s) parallel to the floor when sitting. Off-Loading o Turn and reposition every 2 hours - recommend prop legs/heels so wounds do not touch the bed/chair Additional Orders / Instructions o Follow Nutritious Diet and Increase Protein Intake Wound Treatment Wound #13 - Lower Leg Wound Laterality: Left Cleanser: Soap and Water Every Other Day/30 Days Discharge Instructions: Gently cleanse wound with antibacterial soap, rinse and pat dry prior to dressing wounds Topical: Gentamicin Every Other Day/30 Days Discharge Instructions: Apply thin layer to wound as directed by provider. Primary Dressing: Cutimed Sorbact 1.5x 2.38 (in/in) (Generic) Every Other Day/30 Days Discharge Instructions: A bacteria- and fungi binding wound dressing, suitable for cavities and fistulas. It is suitable as a wound filler and allows the passage of wound exudate into a secondary dressing. The dressing helps reducing odor and pain and can improve healing. Secondary Dressing: Zetuvit Plus 4x8 (in/in) (Generic) Every Other Day/30 Days Electronic Signature(s) Signed: 07/22/2022 4:28:06 PM By: Massie Kluver Signed:  07/22/2022 5:04:29 PM By: Worthy Keeler PA-C Entered By: Massie Kluver on 07/21/2022 14:46:13 [name]  ([id number]) -------------------------------------------------------------------------------- Problem List Details Patient Name: [name] Date of Service: 07/21/2022 2:15 PM Medical Record Number: [id number] Patient Account Number: [id number] Date of Birth/Sex: 07/05/1930 (88 y.o. F) Treating RN: Cornell Barman Primary Care Provider: Fulton Reek Other Clinician: Massie Kluver Referring Provider: Fulton Reek Treating Provider/Extender: Skipper Cliche in Treatment: 22 Active Problems ICD-10 Encounter Code Description Active Date MDM Diagnosis S81.812A Laceration without foreign body, left lower leg, initial encounter 02/17/2022 No Yes L97.812 Non-pressure chronic ulcer of other part of right lower leg with fat layer 02/17/2022 No Yes exposed L97.822 Non-pressure chronic ulcer of other part of left lower leg with fat layer 02/17/2022 No Yes exposed I87.2 Venous insufficiency (chronic) (peripheral) 02/17/2022 No Yes B95.61 Methicillin susceptible Staphylococcus aureus infection as the cause of 07/07/2022 No Yes diseases classified elsewhere Inactive Problems ICD-10 Code Description Active Date Inactive Date S81.811A Laceration without foreign body, right lower leg, initial encounter 02/17/2022 02/17/2022 Resolved Problems Electronic Signature(s) Signed: 07/21/2022 1:47:47 PM By: Worthy Keeler PA-C Entered By: Worthy Keeler on 07/21/2022 13:47:46 [name] ([id number]) -------------------------------------------------------------------------------- Progress Note Details Patient Name: [name] Date of Service: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 is no signs of active infection at this time. There does not appear to be any evidence of infection currently. 08/01/2019 on evaluation today patient actually appears to be doing much better with regard to her right lower extremity ulcer. She has been Rockwell, Langhor[place] V. ([id number]) tolerating the dressing changes without complication. Fortunately her wound does not show any signs of infection and seems to be making good progress. No fevers, chills, nausea, vomiting, or diarrhea. 08/08/2019 on evaluation today patient actually appears to be doing better with regard to her leg ulcer. She has been tolerating the dressing changes without complication. With that being said I think we may want to switch the dressing up today just based on the appearance of the wound bed in general. Fortunately there is no evidence of infection currently. Her pain seems to be much better. Her son is present with her today. 08/15/2019 on evaluation today patient appears to be doing very well with regard to her right lower extremity ulcer. She still complains about feeling like the compression stocking/Tubigrip is too tight for her. Nonetheless I still think this is beneficial and is helping the wound to heal more effectively. Overall I am extremely pleased at this time with what I am seeing. 08/22/2019 on evaluation today patient actually appears to be doing quite well with regard to her right lower extremity. She has been tolerating the dressing changes without complication. Fortunately there is no signs of active infection at this time. She still complains about the Tubigrip  but nonetheless I think this is something that is of utmost importance for her to continue to wear if she is going to see this area heal appropriately. 08/29/2019 on evaluation today patient actually appears to be doing well visually in regard to her wound. Unfortunately in regard to overall pain she seems to be having more pain today which I am somewhat concerned about. There does not appear to be any signs of active infection that I can tell but again with increased pain that is definitely a concern here today. I do think it may be time to switch up the dressing as well to something that will be a little bit more effective hopefully and new tissue growth she has done well with collagen in the past. 09/05/2019 on evaluation today patient actually appears to be doing well with regard to her leg ulcer. She has been tolerating the dressing changes without complication. Fortunately there is no signs of active infection. We did obtain a wound culture last week due to the fact that she still is very touchy as far as the surface of the wound is concerned. Unfortunately I am not sure what happened to that culture as there is no record in the computer system. Nonetheless we need to follow-up on this and try to see if  we can identify what exactly happened with this specimen as we have not gotten a result back either obviously. 09/12/2019 on evaluation today patient actually appears to be showing signs of good improvement upon evaluation today. She states her pain is not nearly as bad as what it was which is also good news. She did have a wound culture which was reviewed today that showed no growth of bacteria which is good news. This coupled with the fact that her wound appears to be healing better, measuring smaller, and overall even the slough buildup is minimal compared to what we have seen previously, I feel like she is actually showing signs of excellent improvement today. 09/19/2019 on evaluation today  patient appears to be doing well with regard to her wounds on evaluation today. She is showing signs of continued improvement week by week and everything is measuring significantly smaller which is great news. Overall very pleased with the progress that she has made. 09/26/2019 on evaluation today patient appears to be doing fairly well with regard to her right lower extremity ulcer. She does continue to have some drainage and again the smaller of the 2 wounds is not making as much progress as I like to see. We have been doing the silver collagen for some time I think we may want to switch to [facility] she has a little bit of hyper granular tissue this may help to keep things from becoming too dramatic in that regard. Fortunately there is no signs of infection at this time. 10/03/2019 on evaluation today patient appears to be doing fairly well with regard to her lower extremity ulcers at this time. She has been tolerating the dressing changes without complication. Fortunately the wound is measuring smaller which is great news she continues to make great progress. 10/17/2019 on evaluation today patient appears to be doing well with regard to her wounds of her right lower extremity. She states she has been having some increased pain apparently we gave her a little bit smaller size Tubigrip last time she was here this may have been what was causing some issues. She is out of the Tubigrip that we had ordered for her and therefore did not have any of the smaller size. Fortunately there is no evidence of active infection at this time. No fever chills noted 10/24/2019 upon evaluation today patient's wounds actually appear to be showing signs of good improvement which is excellent news. She has been tolerating the dressing changes without complication and overall I am rather pleased at this point with how things seem to be progressing. The patient likewise is very happy. She still was not happy with  having to use the Tubigrip she states that bunches up in her shoe. 10/31/2019 on evaluation today patient actually appears to be doing quite well with regard to her leg ulcer. She has been tolerating the dressing changes without complication. Fortunately there is no signs of active infection at this time. Overall I am very pleased with the progress that she is made. She does wonder if we could switch to the bordered adhesive Hydrofera Blue I think that something we can definitely look into for her. 11/15/2019 patient on evaluation today appears to be making excellent progress. Her wound is significantly smaller and the wound bed appears to be dramatically improved compared to even the last visit. Fortunately she is having really little to no pain at this time. No fevers, chills, nausea, vomiting, or diarrhea. 11/25/2019 on evaluation today patient appears to be  doing well with regard to her lower extremity ulcer. She has been tolerating dressing changes without complication. Fortunately there is no signs of active infection at this time. No fevers, chills, nausea, vomiting, or diarrhea. 12/02/2019 on evaluation today patient unfortunately is not doing quite as well as what we would have liked to have seen based on last week's evaluation. Fortunately there is no signs of active infection at this time. With that being said she is continuing to experience some issues with hyper granulation she was doing much better with the [facility] prior to putting the contact layer in between her skin and the [facility] I think that we need to go back to that. She just can have to be very careful with removing the dressing in order to ensure it does not pull skin and get stuck. 12/16/2019 on evaluation today patient appears to be doing better her wound is measuring significantly smaller at this time compared to where things were previous. Fortunately there is no evidence of active infection at this point which  is also good news. No fever chills noted. 12/23/2019 upon evaluation today patient appears to be doing excellent in regard to her lower extremity ulcer. She has been tolerating the dressing changes without complication. Fortunately there is no signs of active infection at this time. Overall I am very pleased with the fact that she is doing so well today. READMISSION 08/12/2020 [name] ([id number]) Mrs. Signorile is a now 88 year old woman we have had in this clinic on at least 2 occasions. She spent a protracted period of time here for a postsurgical wound on her left olecranon and then more recently towards the end of last year earlier this year was in for a skin tear on her left lower leg caused by her dog. She comes in today with a reasonably acute area on her left forearm again apparently caused by her dog although it was not a bite injury. She was seen in urgent care and they managed to reoppose a lot of the skin tear although the patient states that is now reopened slightly. They have been using Neosporin. She has very fragile skin probably a combination of chronic steroid use for rheumatoid arthritis [although I do not see that she is on that now] solar skin damage. I note that she takes methotrexate and Plaquenil for her rheumatoid arthritis 9/15; this patient had a skin tear on her left forearm from trauma with her dog. She was seen in urgent care and they reapposed most of the separated skin. Fortunately for her most of this is remained intact and the skin above and below the wound actually looks healthy. Where there was a separation and the 2 flaps of skin there is still an open area however the granulation tissue looks healthy here. We have been using Xeroform however I had like to change to [facility] 9/29; skin tear on her left arm from a dog scratch. The skin was reapposed in an urgent care and surprisingly stayed viable. We used Hydrofera Blue last time the wound is  smaller 10/13; patient comes back to clinic after 2 weeks for a left arm wound secondary to a dog scratch. The skin was reapposed and in urgent care. We have been using Hydrofera Blue and the wound is closed today. Readmission 02/17/2022 Ms. Karie Bridgman is an 88 year old female with a past medical history of chronic venous insufficiency and lymphedema, COPD, rheumatoid arthritis and hypertension that presents with a 2-week history of  wounds to her lower extremities bilaterally. She states she had a skin tag to the right posterior leg that came off and became a wound. She states the area has slowly gotten larger. She was seen by her primary care physician's office on 02/15/2022 for this issue. She was prescribed Bactroban and cefdinir. She has 2 other wounds 1 located to each leg. These were from trauma. She states one was from trying to put on compression stockings. Her ABIs in office on the left was 0.93 and 1.0 on the right. She currently denies systemic signs of infection. 02/24/2022 upon evaluation today patient appears to be doing well currently in regard to her skin tears. In general I feel like she is making good progress. Fortunately I do not see any evidence of active infection locally or systemically which is great news. No fevers, chills, nausea, vomiting, or diarrhea. 03/04/2022 upon evaluation patient actually appears to be doing well in regard to the wounds on her legs. She has been tolerating the dressing changes without complication. Overall I think all are showing signs of improvement which is great news. 03/10/2022 upon evaluation today patient appears to be doing better in regard to her wounds in general. I am very pleased with where things stand. I do believe that she is showing signs of good improvement which is great news. No fevers, chills, nausea, vomiting, or diarrhea. 03-21-2022 upon evaluation today patient appears to be doing well with regard to her wound on the left leg and the  anterior right leg both are showing signs of significant improvement. Unfortunately she has a lot of slough and biofilm buildup on the surface of the posterior wound which is not good. I do not see any signs of active infection locally or systemically which is great news. No fevers, chills, nausea, vomiting, or diarrhea. 03-29-2022 upon evaluation today patient appears to be doing well currently in regard to her wounds. She has been tolerating the dressing changes without complication. Fortunately there does not appear to be any signs of active infection at this time which is great news. No fevers, chills, nausea, vomiting, or diarrhea. 4/27; right posterior calf and left lateral calf both initially traumatic in the setting of significant venous insufficiency. She did not tolerate compression wraps apparently because she could not get shoes on her feet and therefore we have been using Tubigrip's and silver alginate 5/8; right posterior calf 100% covered with adherent debris. The area on the left lateral really looks quite good under illumination islands of new epithelialization. We have been using Xeroform on the left Iodoflex on the right bilateral 2 layer Tubigrip's 04-28-2022 upon evaluation today patient appears to be doing well with regard to her wound she has been tolerating the dressing changes which is great news. Overall I feel like the Xeroform is doing well on the left leg this is pretty much healed on the right leg she is very close to being healed as well I think we can probably switch over to Xeroform here as well. 05-05-2022 upon evaluation today patient appears to be doing excellent in regard to her wounds both are showing signs of being very close to complete resolution which is awesome news. Fortunately I do not see any signs of active infection at this time which is great news and in general I think we are headed in the right direction. 05-13-2022 upon evaluation today patient appears  to be doing well with regard to her wounds. She however does have some breakdown due  to the fact that I think is staying too moist on the left lateral aspect. The right is also staying a little bit too moist in my opinion. Subsequently I think that we may want to switch the dressings currently. I would recommend switching over to a silver cell dressing. 6/13; apparently the patient was felt to be having home health by [facility] therefore she did not receive supplies for her wound care. Current dressing remained on for for 5 days. Removed today by her intake nurse with purulent drainage and the left lateral lower leg is considerably larger also slightly larger on the right. They are changing this usually every second day. At this point I am not really sure why the patient flagged as having home health 05-31-2022 upon evaluation today patient appears to be doing well with regard to her wounds in general. Fortunately I do not see any signs of active infection at this time systemically though locally she did have an infection last week and she is doing some better but was only able to take 3 days total of her medication. I will go ahead and get her sent in a prescription for doxycycline because the Bactrim was causing her stomach upset and I think probably the treatment matter is that she will benefit from at least 7 to 10 days of this doxycycline to make sure the infection is completely gone. 06-07-2022 upon evaluation today patient appears to be doing better. Her right leg is completely healed which is great news. The left leg is improving though not completely healed I think we are on the right track here still and I am very pleased with where things stand at this point. There does not appear to be any sign of infection at all at this time. 06-13-2022 upon evaluation today patient appears to be doing well with regard to her wound in fact this is showing signs of excellent improvement which is great news.  Fortunately there does not appear to be any evidence of active infection locally or systemically at this time. No fevers, chills, nausea, vomiting, or diarrhea. [name], [name] ([id number]) 06-21-2022 upon evaluation today patient's wound on the right leg is still doing well the left leg had a lot of dry skin trapping fluid up underneath. Fortunately there does not appear to be any evidence of infection locally or systemically at this time which is great news and overall very pleased in that regard. Nonetheless I do believe that we need to try to get some of this away so that the alginate can collect the drainage. 06-30-2022 upon evaluation patient continues to have a wound that just seems to continue to morph and change as we progress here. I do not see any signs of infection systemically though locally I am seeing some issues here with erythema and minimal warmth I think that she could benefit from topical antibiotic therapy possibly with [employer] initially I will probably use gentamicin while we obtain a culture and ensure we know what exactly we are dealing with here. 7/27; patient with a wound on her left anterior lower leg she is complaining of pain although this does not look to be too much different from what our staff is used to seeing. The wounds are superficial and not draining. A culture was done of the left lower leg wound last week which showed MRSA. She is allergic to doxycycline and Bactrim interacts with methotrexate. I therefore prescribed linezolid" and E scribed that to pharmacy on 7/24. Unfortunately this was  not communicated to the patient and I have expressed my regret about this.Fortunately I do not know the delay is caused any further difficulties 07-21-2022 upon evaluation today patient appears to be doing well currently in regard to the wound on her left leg. This is pretty much about completely healed which is great news I do believe the linezolid following the culture was  good for her and has made a big difference here. Fortunately I do not see any signs of active infection locally or systemically at this time which is great news. No fevers, chills, nausea, vomiting, or diarrhea. Objective Constitutional Well-nourished and well-hydrated in no acute distress. Vitals Time Taken: 2:12 PM, Height: 60 in, Weight: 132 lbs, BMI: 25.8, Temperature: 97.8 F, Pulse: 65 bpm, Respiratory Rate: 18 breaths/min, Blood Pressure: 138/71 mmHg. Respiratory normal breathing without difficulty. Psychiatric this patient is able to make decisions and demonstrates good insight into disease process. Alert and Oriented x 3. pleasant and cooperative. General Notes: Upon inspection patient's wound again showed signs of almost complete epithelization in regard to the left leg. On the right leg she does have a very small area where she had a tiny skin tear fortunately there is nothing too severe going on here and I do believe that this is pretty much still back up there is not even really an open wound at this point is more of a bruise that is where the patient snagged herself with her fingernail. Since her fingernails have been cut which I do believe is good news. Integumentary (Hair, Skin) Wound #13 status is Open. Original cause of wound was Trauma. The date acquired was: 02/09/2022. The wound has been in treatment 22 weeks. The wound is located on the Left Lower Leg. The wound measures 0.3cm length x 0.2cm width x 0.1cm depth; 0.047cm^2 area and 0.005cm^3 volume. There is Fat Layer (Subcutaneous Tissue) exposed. There is a medium amount of serosanguineous drainage noted. There is medium (34- 66%) red granulation within the wound bed. There is a medium (34-66%) amount of necrotic tissue within the wound bed including Adherent Slough. Assessment Active Problems ICD-10 Laceration without foreign body, left lower leg, initial encounter Non-pressure chronic ulcer of other part of right  lower leg with fat layer exposed Non-pressure chronic ulcer of other part of left lower leg with fat layer exposed Venous insufficiency (chronic) (peripheral) Methicillin susceptible Staphylococcus aureus infection as the cause of diseases classified elsewhere ZO, MARET ([id number]) Plan Follow-up Appointments: Return Appointment in 1 week. Bathing/ Shower/ Hygiene: May shower; gently cleanse wound with antibacterial soap, rinse and pat dry prior to dressing wounds No tub bath. Anesthetic (Use 'Patient Medications' Section for Anesthetic Order Entry): Lidocaine applied to wound bed Edema Control - Lymphedema / Segmental Compressive Device / Other: Tubigrip double layer applied - size c Elevate, Exercise Daily and Avoid Standing for Long Periods of Time. Elevate legs to the level of the heart and pump ankles as often as possible Elevate leg(s) parallel to the floor when sitting. Off-Loading: Turn and reposition every 2 hours - recommend prop legs/heels so wounds do not touch the bed/chair Additional Orders / Instructions: Follow Nutritious Diet and Increase Protein Intake WOUND #13: - Lower Leg Wound Laterality: Left Cleanser: Soap and Water Every Other Day/30 Days Discharge Instructions: Gently cleanse wound with antibacterial soap, rinse and pat dry prior to dressing wounds Topical: Gentamicin Every Other Day/30 Days Discharge Instructions: Apply thin layer to wound as directed by provider. Primary Dressing: Cutimed Sorbact 1.5x 2.38 (in/in) (  Generic) Every Other Day/30 Days Discharge Instructions: A bacteria- and fungi binding wound dressing, suitable for cavities and fistulas. It is suitable as a wound filler and allows the passage of wound exudate into a secondary dressing. The dressing helps reducing odor and pain and can improve healing. Secondary Dressing: Zetuvit Plus 4x8 (in/in) (Generic) Every Other Day/30 Days 1. I would recommend currently that we going continue with  the wound care measures as before and the patient is in agreement with plan. This includes the use of the gentamicin followed by the Sorbact which has done a great job. 2. I am also can recommend that we have the patient continue with ABD pad to cover followed by the Tubigrip to hold in place. 3. I am also can recommend the patient continue to monitor for any signs of worsening or infection. Obviously if anything changes she should let me know the right now the good news is the linezolid has done a great job for her. We will see patient back for reevaluation in 1 week here in the clinic. If anything worsens or changes patient will contact our office for additional recommendations. Electronic Signature(s) Signed: 07/21/2022 3:05:53 PM By: Worthy Keeler PA-C Entered By: Worthy Keeler on 07/21/2022 15:05:53 [name] ([id number]) -------------------------------------------------------------------------------- SuperBill Details Patient Name: [name] Date of Service: 07/21/2022 Medical Record Number: [id number] Patient Account Number: [id number] Date of Birth/Sex: [birth year] (88 y.o. F) Treating RN: Cornell Barman Primary Care Provider: Fulton Reek Other Clinician: Massie Kluver Referring Provider: Fulton Reek Treating Provider/Extender: Skipper Cliche in Treatment: 22 Diagnosis Coding ICD-10 Codes Code Description [phone] Laceration without foreign body, left lower leg, initial encounter L97.812 Non-pressure chronic ulcer of other part of right lower leg with fat layer exposed L97.822 Non-pressure chronic ulcer of other part of left lower leg with fat layer exposed I87.2 Venous insufficiency (chronic) (peripheral) B95.61 Methicillin susceptible Staphylococcus aureus infection as the cause of diseases classified elsewhere Facility Procedures CPT4 Code: [id number] Description: 99213 - WOUND CARE VISIT-LEV 3 EST PT Modifier: Quantity: 1 Physician Procedures CPT4  Code: [id number] Description: [id number] - WC PHYS LEVEL 3 - EST PT Modifier: Quantity: 1 CPT4 Code: Description: ICD-10 Diagnosis Description S81.812A Laceration without foreign body, left lower leg, initial encounter L97.812 Non-pressure chronic ulcer of other part of right lower leg with fat la L97.822 Non-pressure chronic ulcer of other part of left  lower leg with fat lay I87.2 Venous insufficiency (chronic) (peripheral) Modifier: yer exposed er exposed Quantity: Electronic Signature(s) Signed: 07/21/2022 3:08:00 PM By: Worthy Keeler PA-C Entered By: Worthy Keeler on 07/21/2022 15:08:00

## 2022-07-21 NOTE — Progress Notes (Addendum)
Natasha, Barnett ([id number]) Visit Report for 07/21/2022 Arrival Information Details Patient Name: [name], [name]. Date of Service: 07/21/2022 2:15 PM Medical Record Number: [id number] Patient Account Number: [id number] Date of Birth/Sex: 09-24-1930 (88 y.o. F) Treating RN: Cornell Barman Primary Care Nickola Lenig: Fulton Reek Other Clinician: Massie Kluver Referring Hairo Garraway: Fulton Reek Treating Azir Muzyka/Extender: Skipper Cliche in Treatment: 53 Visit Information History Since Last Visit All ordered tests and consults were completed: No Patient Arrived: Wheel Chair Added or deleted any medications: No Arrival Time: 14:13 Any new allergies or adverse reactions: No Transfer Assistance: EasyPivot Patient Lift Had a fall or experienced change in No activities of daily living that may affect Patient Requires Transmission-Based No risk of falls: Precautions: Hospitalized since last visit: No Patient Has Alerts: Yes Pain Present Now: No Patient Alerts: NOT diabetic Electronic Signature(s) Signed: 07/22/2022 4:28:06 PM By: Massie Kluver Entered By: Massie Kluver on 07/21/2022 14:15:26 [name] ([id number]) -------------------------------------------------------------------------------- Clinic Level of Care Assessment Details Patient Name: [name] Date of Service: 07/21/2022 2:15 PM Medical Record Number: [id number] Patient Account Number: [id number] Date of Birth/Sex: Aug 24, 1930 (88 y.o. F) Treating RN: Cornell Barman Primary Care Kasen Sako: Fulton Reek Other Clinician: Massie Kluver Referring Edelyn Heidel: Fulton Reek Treating Carlin Attridge/Extender: Skipper Cliche in Treatment: 22 Clinic Level of Care Assessment Items TOOL 4 Quantity Score '[]'  - Use when only an EandM is performed on FOLLOW-UP visit 0 ASSESSMENTS - Nursing Assessment / Reassessment X - Reassessment of Co-morbidities (includes updates in patient status) 1 10 X- 1 5 Reassessment of Adherence  to Treatment Plan ASSESSMENTS - Wound and Skin Assessment / Reassessment X - Simple Wound Assessment / Reassessment - one wound 1 5 '[]'  - 0 Complex Wound Assessment / Reassessment - multiple wounds '[]'  - 0 Dermatologic / Skin Assessment (not related to wound area) ASSESSMENTS - Focused Assessment '[]'  - Circumferential Edema Measurements - multi extremities 0 '[]'  - 0 Nutritional Assessment / Counseling / Intervention '[]'  - 0 Lower Extremity Assessment (monofilament, tuning fork, pulses) '[]'  - 0 Peripheral Arterial Disease Assessment (using hand held doppler) ASSESSMENTS - Ostomy and/or Continence Assessment and Care '[]'  - Incontinence Assessment and Management 0 '[]'  - 0 Ostomy Care Assessment and Management (repouching, etc.) PROCESS - Coordination of Care X - Simple Patient / Family Education for ongoing care 1 15 '[]'  - 0 Complex (extensive) Patient / Family Education for ongoing care '[]'  - 0 Staff obtains Programmer, systems, Records, Test Results / Process Orders '[]'  - 0 Staff telephones HHA, Nursing Homes / Clarify orders / etc '[]'  - 0 Routine Transfer to another Facility (non-emergent condition) '[]'  - 0 Routine Hospital Admission (non-emergent condition) '[]'  - 0 New Admissions / Biomedical engineer / Ordering NPWT, Apligraf, etc. '[]'  - 0 Emergency Hospital Admission (emergent condition) X- 1 10 Simple Discharge Coordination '[]'  - 0 Complex (extensive) Discharge Coordination PROCESS - Special Needs '[]'  - Pediatric / Minor Patient Management 0 '[]'  - 0 Isolation Patient Management '[]'  - 0 Hearing / Language / Visual special needs '[]'  - 0 Assessment of Community assistance (transportation, D/C planning, etc.) '[]'  - 0 Additional assistance / Altered mentation '[]'  - 0 Support Surface(s) Assessment (bed, cushion, seat, etc.) INTERVENTIONS - Wound Cleansing / Measurement TAISLEY, MORDAN V. ([id number]) X- 1 5 Simple Wound Cleansing - one wound '[]'  - 0 Complex Wound Cleansing - multiple wounds X-  1 5 Wound Imaging (photographs - any number of wounds) '[]'  - 0 Wound Tracing (instead of photographs) X- 1 5 Simple Wound Measurement - one  wound '[]'  - 0 Complex Wound Measurement - multiple wounds INTERVENTIONS - Wound Dressings '[]'  - Small Wound Dressing one or multiple wounds 0 X- 1 15 Medium Wound Dressing one or multiple wounds '[]'  - 0 Large Wound Dressing one or multiple wounds X- 1 5 Application of Medications - topical '[]'  - 0 Application of Medications - injection INTERVENTIONS - Miscellaneous '[]'  - External ear exam 0 '[]'  - 0 Specimen Collection (cultures, biopsies, blood, body fluids, etc.) '[]'  - 0 Specimen(s) / Culture(s) sent or taken to Lab for analysis '[]'  - 0 Patient Transfer (multiple staff / Civil Service fast streamer / Similar devices) '[]'  - 0 Simple Staple / Suture removal (25 or less) '[]'  - 0 Complex Staple / Suture removal (26 or more) '[]'  - 0 Hypo / Hyperglycemic Management (close monitor of Blood Glucose) '[]'  - 0 Ankle / Brachial Index (ABI) - do not check if billed separately X- 1 5 Vital Signs Has the patient been seen at the hospital within the last three years: Yes Total Score: 85 Level Of Care: New/Established - Level 3 Electronic Signature(s) Signed: 07/22/2022 4:28:06 PM By: Massie Kluver Entered By: Massie Kluver on 07/21/2022 14:47:02 [name] ([id number]) -------------------------------------------------------------------------------- Encounter Discharge Information Details Patient Name: [name] Date of Service: 07/21/2022 2:15 PM Medical Record Number: [id number] Patient Account Number: [id number] Date of Birth/Sex: 1930/01/03 (88 y.o. F) Treating RN: Cornell Barman Primary Care Erek Kowal: Fulton Reek Other Clinician: Massie Kluver Referring Lavona Norsworthy: Fulton Reek Treating Elliot Meldrum/Extender: Skipper Cliche in Treatment: 22 Encounter Discharge Information Items Discharge Condition: Stable Ambulatory Status: Wheelchair Discharge  Destination: Home Transportation: Private Auto Accompanied By: caregiver Schedule Follow-up Appointment: Yes Clinical Summary of Care: Electronic Signature(s) Signed: 07/22/2022 4:28:06 PM By: Massie Kluver Entered By: Massie Kluver on 07/21/2022 14:49:01 [name] ([id number]) -------------------------------------------------------------------------------- Lower Extremity Assessment Details Patient Name: [name] Date of Service: 07/21/2022 2:15 PM Medical Record Number: [id number] Patient Account Number: [id number] Date of Birth/Sex: 06-05-1930 (88 y.o. F) Treating RN: Cornell Barman Primary Care Shadeed Colberg: Fulton Reek Other Clinician: Massie Kluver Referring Jarvin Ogren: Fulton Reek Treating Berlynn Warsame/Extender: Skipper Cliche in Treatment: 22 Edema Assessment Assessed: [Left: Yes] [Right: No] Edema: [Left: N] [Right: o] Calf Left: Right: Point of Measurement: 32 cm From Medial Instep 30.5 cm Ankle Left: Right: Point of Measurement: 10 cm From Medial Instep 21.5 cm Vascular Assessment Pulses: Dorsalis Pedis Palpable: [Left:Yes] Posterior Tibial Palpable: [Left:Yes] Electronic Signature(s) Signed: 07/22/2022 9:12:30 AM By: Gretta Cool, BSN, RN, CWS, Kim RN, BSN Signed: 07/22/2022 4:28:06 PM By: Massie Kluver Entered By: Massie Kluver on 07/21/2022 14:28:35 [name] ([id number]) -------------------------------------------------------------------------------- Multi Wound Chart Details Patient Name: [name] Date of Service: 07/21/2022 2:15 PM Medical Record Number: [id number] Patient Account Number: [id number] Date of Birth/Sex: Jul 05, 1930 (88 y.o. F) Treating RN: Cornell Barman Primary Care Karlee Staff: Fulton Reek Other Clinician: Massie Kluver Referring Brekyn Huntoon: Fulton Reek Treating Jatinder Mcdonagh/Extender: Skipper Cliche in Treatment: 22 Vital Signs Height(in): 60 Pulse(bpm): 36 Weight(lbs): 132 Blood Pressure(mmHg): 138/71 Body Mass  Index(BMI): 25.8 Temperature(F): 97.8 Respiratory Rate(breaths/min): 18 Photos: [N/A:N/A] Wound Location: Left Lower Leg N/A N/A Wounding Event: Trauma N/A N/A Primary Etiology: Skin Tear N/A N/A Comorbid History: Cataracts, Glaucoma, Optic N/A N/A Neuritis, Chronic sinus problems/congestion, Middle ear problems, Hypertension, Rheumatoid Arthritis Date Acquired: 02/09/2022 N/A N/A Weeks of Treatment: 22 N/A N/A Wound Status: Open N/A N/A Wound Recurrence: No N/A N/A Measurements L x W x D (cm) 0.3x0.2x0.1 N/A N/A Area (cm) : 0.047 N/A N/A Volume (cm) : 0.005  N/A N/A % Reduction in Area: 96.90% N/A N/A % Reduction in Volume: 96.70% N/A N/A Classification: Full Thickness Without Exposed N/A N/A Support Structures Exudate Amount: Medium N/A N/A Exudate Type: Serosanguineous N/A N/A Exudate Color: red, brown N/A N/A Granulation Amount: Medium (34-66%) N/A N/A Granulation Quality: Red N/A N/A Necrotic Amount: Medium (34-66%) N/A N/A Exposed Structures: Fat Layer (Subcutaneous Tissue): N/A N/A Yes Fascia: No Tendon: No Muscle: No Joint: No Bone: No Epithelialization: None N/A N/A Treatment Notes Electronic Signature(s) Signed: 07/22/2022 4:28:06 PM By: Massie Kluver Entered By: Massie Kluver on 07/21/2022 14:28:56 [name] ([id number]) Mallie Mussel, Darel Hong ([id number]) -------------------------------------------------------------------------------- [employer] Details Patient Name: [name] Date of Service: 07/21/2022 2:15 PM Medical Record Number: [id number] Patient Account Number: [id number] Date of Birth/Sex: 1930-05-30 (88 y.o. F) Treating RN: Cornell Barman Primary Care Amando Chaput: Fulton Reek Other Clinician: Massie Kluver Referring Margarethe Virgen: Fulton Reek Treating Azaryah Oleksy/Extender: Skipper Cliche in Treatment: 22 Active Inactive Necrotic Tissue Nursing Diagnoses: Impaired tissue integrity related to necrotic/devitalized  tissue Knowledge deficit related to management of necrotic/devitalized tissue Goals: Necrotic/devitalized tissue will be minimized in the wound bed Date Initiated: 06/21/2022 Target Resolution Date: 07/22/2022 Goal Status: Active Patient/caregiver will verbalize understanding of reason and process for debridement of necrotic tissue Date Initiated: 06/21/2022 Date Inactivated: 07/07/2022 Target Resolution Date: 07/22/2022 Goal Status: Met Interventions: Assess patient pain level pre-, during and post procedure and prior to discharge Provide education on necrotic tissue and debridement process Treatment Activities: Excisional debridement : 06/21/2022 Notes: Electronic Signature(s) Signed: 07/22/2022 9:12:30 AM By: Gretta Cool, BSN, RN, CWS, Kim RN, BSN Signed: 07/22/2022 4:28:06 PM By: Massie Kluver Entered By: Massie Kluver on 07/21/2022 14:28:41 [name] ([id number]) -------------------------------------------------------------------------------- Pain Assessment Details Patient Name: [name] Date of Service: 07/21/2022 2:15 PM Medical Record Number: [id number] Patient Account Number: [id number] Date of Birth/Sex: 09-Oct-1930 (88 y.o. F) Treating RN: Cornell Barman Primary Care Adonia Porada: Fulton Reek Other Clinician: Massie Kluver Referring Jalaina Salyers: Fulton Reek Treating Rustin Erhart/Extender: Skipper Cliche in Treatment: 22 Active Problems Location of Pain Severity and Description of Pain Patient Has Paino No Site Locations Pain Management and Medication Current Pain Management: Electronic Signature(s) Signed: 07/22/2022 9:12:30 AM By: Gretta Cool, BSN, RN, CWS, Kim RN, BSN Signed: 07/22/2022 4:28:06 PM By: Massie Kluver Entered By: Massie Kluver on 07/21/2022 14:18:43 [name] ([id number]) -------------------------------------------------------------------------------- Patient/Caregiver Education Details Patient Name: [name] Date of Service: 07/21/2022  2:15 PM Medical Record Number: [id number] Patient Account Number: [id number] Date of Birth/Gender: Oct 25, 1930 (88 y.o. F) Treating RN: Cornell Barman Primary Care Physician: Fulton Reek Other Clinician: Massie Kluver Referring Physician: Fulton Reek Treating Physician/Extender: Skipper Cliche in Treatment: 22 Education Assessment Education Provided To: Patient Education Topics Provided Wound/Skin Impairment: Handouts: Other: continue wound care as directed Electronic Signature(s) Signed: 07/22/2022 4:28:06 PM By: Massie Kluver Entered By: Massie Kluver on 07/21/2022 14:47:51 [name] ([id number]) -------------------------------------------------------------------------------- Wound Assessment Details Patient Name: [name] Date of Service: 07/21/2022 2:15 PM Medical Record Number: [id number] Patient Account Number: [id number] Date of Birth/Sex: [birth year] (88 y.o. F) Treating RN: Cornell Barman Primary Care Chantea Surace: Fulton Reek Other Clinician: Massie Kluver Referring Lester Crickenberger: Fulton Reek Treating Beverlyann Broxterman/Extender: Skipper Cliche in Treatment: 22 Wound Status Wound Number: 13 Primary Skin Tear Etiology: Wound Location: Left Lower Leg Wound Open Wounding Event: Trauma Status: Date Acquired: 02/09/2022 Comorbid Cataracts, Glaucoma, Optic Neuritis, Chronic sinus Weeks Of Treatment: 22 History: problems/congestion, Middle ear problems, Hypertension, Clustered Wound: No Rheumatoid Arthritis Photos Wound Measurements Length: (  cm) 0.3 Width: (cm) 0.2 Depth: (cm) 0.1 Area: (cm) 0.047 Volume: (cm) 0.005 % Reduction in Area: 96.9% % Reduction in Volume: 96.7% Epithelialization: None Wound Description Classification: Full Thickness Without Exposed Support Structu Exudate Amount: Medium Exudate Type: Serosanguineous Exudate Color: red, brown res Foul Odor After Cleansing: No Slough/Fibrino Yes Wound Bed Granulation Amount: Medium  (34-66%) Exposed Structure Granulation Quality: Red Fascia Exposed: No Necrotic Amount: Medium (34-66%) Fat Layer (Subcutaneous Tissue) Exposed: Yes Necrotic Quality: Adherent Slough Tendon Exposed: No Muscle Exposed: No Joint Exposed: No Bone Exposed: No Treatment Notes Wound #13 (Lower Leg) Wound Laterality: Left Cleanser Soap and Water Discharge Instruction: Gently cleanse wound with antibacterial soap, rinse and pat dry prior to dressing wounds [place] ([id number]) Topical Gentamicin Discharge Instruction: Apply thin layer to wound as directed by Harli Engelken. Primary Dressing Cutimed Sorbact 1.5x 2.38 (in/in) Discharge Instruction: A bacteria- and fungi binding wound dressing, suitable for cavities and fistulas. It is suitable as a wound filler and allows the passage of wound exudate into a secondary dressing. The dressing helps reducing odor and pain and can improve healing. Secondary Dressing Zetuvit Plus 4x8 (in/in) Secured With Compression Wrap Compression Stockings Environmental education officer) Signed: 07/22/2022 9:12:30 AM By: Gretta Cool, BSN, RN, CWS, Kim RN, BSN Signed: 07/22/2022 4:28:06 PM By: Massie Kluver Entered By: Massie Kluver on 07/21/2022 14:26:42 [name] ([id number]) -------------------------------------------------------------------------------- [place] Details Patient Name: [name] Date of Service: 07/21/2022 2:15 PM Medical Record Number: [id number] Patient Account Number: [id number] Date of Birth/Sex: 1929-12-21 (88 y.o. F) Treating RN: Cornell Barman Primary Care Rito Lecomte: Fulton Reek Other Clinician: Massie Kluver Referring Emmelyn Schmale: Fulton Reek Treating Paeton Latouche/Extender: Skipper Cliche in Treatment: 22 Vital Signs Time Taken: 14:12 Temperature (F): 97.8 Height (in): 60 Pulse (bpm): 65 Weight (lbs): 132 Respiratory Rate (breaths/min): 18 Body Mass Index (BMI): 25.8 Blood Pressure (mmHg):  138/71 Reference Range: 80 - 120 mg / dl Electronic Signature(s) Signed: 07/22/2022 4:28:06 PM By: Massie Kluver Entered By: Massie Kluver on 07/21/2022 14:17:54

## 2022-07-28 ENCOUNTER — Ambulatory Visit: Payer: Medicare Other | Admitting: Physician Assistant

## 2022-08-09 ENCOUNTER — Ambulatory Visit: Payer: Medicare Other | Admitting: Physician Assistant

## 2022-08-22 ENCOUNTER — Encounter: Payer: Medicare Other | Attending: Physician Assistant | Admitting: Physician Assistant

## 2022-08-22 DIAGNOSIS — N390 Urinary tract infection, site not specified: Secondary | ICD-10-CM | POA: Diagnosis not present

## 2022-08-22 DIAGNOSIS — A419 Sepsis, unspecified organism: Secondary | ICD-10-CM | POA: Diagnosis not present

## 2022-08-22 NOTE — Progress Notes (Signed)
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 active infection at this point. 07/18/19 upon evaluation today patient appears to be doing better with regard to her ulcer on her right lower extremity. She's been tolerating the dressing changes without complication. The good news is she seems to be making excellent progress. Overall I'm pleased with there being no signs of infection. With that being said she does tell me that she's been having some discomfort with the wrap she's unsure of exactly what about the rafters causing her trouble. 07/25/2019 on evaluation today patient appears to be doing a little better in regard to her lower extremity ulcer. Unfortunately the alginate seems to be getting really stuck to the wound bed and surrounding periwound. Fortunately there is no signs of active infection  at this time. There does not appear to be any evidence of infection currently. 08/01/2019 on evaluation today patient actually appears to be doing much better with regard to her right lower extremity ulcer. She has been tolerating the dressing changes without complication. Fortunately her wound does not show any signs of infection and seems to be making good progress. No fevers, chills, nausea, vomiting, or diarrhea. 08/08/2019 on  evaluation today patient actually appears to be doing better with regard to her leg ulcer. She has been tolerating the dressing changes without complication. With that being said I think we may want to switch the dressing up today just based on the appearance of the wound bed in general. Fortunately there is no evidence of infection currently. Her pain seems to be much better. Her son is present with her AMYIA, LODWICK ([id number]) today. 08/15/2019 on evaluation today patient appears to be doing very well with regard to her right lower extremity ulcer. She still complains about feeling like the compression stocking/Tubigrip is too tight for her. Nonetheless I still think this is beneficial and is helping the wound to heal more effectively. Overall I am extremely pleased at this time with what I am seeing. 08/22/2019 on evaluation today patient actually appears to be doing quite well with regard to her right lower extremity. She has been tolerating the dressing changes without complication. Fortunately there is no signs of active infection at this time. She still complains about the Tubigrip but nonetheless I think this is something that is of utmost importance for her to continue to wear if she is going to see this area heal appropriately. 08/29/2019 on evaluation today patient actually appears to be doing well visually in regard to her wound. Unfortunately in regard to overall pain she seems to be having more pain today which I am somewhat concerned about. There does not appear to be any signs of active infection that I can tell but again with increased pain that is definitely a concern here today. I do think it may be time to switch up the dressing as well to something that will be a little bit more effective hopefully and new tissue growth she has done well with collagen in the past. 09/05/2019 on evaluation today patient actually appears to be doing well with regard to her leg ulcer. She has been  tolerating the dressing changes without complication. Fortunately there is no signs of active infection. We did obtain a wound culture last week due to the fact that she still is very touchy as far as the surface of the wound is concerned. Unfortunately I am not sure what happened to that culture as there is no record in the computer system. Nonetheless we need to follow-up on this and try to see if we can identify what exactly happened with this specimen as we have not gotten a result back either obviously. 09/12/2019 on evaluation today patient actually appears to be showing signs of good improvement upon evaluation today. She states her pain is not nearly as bad as what it was which is also good news. She did have a wound culture which was reviewed today that showed no growth of bacteria which is good news. This coupled with the fact that her wound appears to be healing better, measuring smaller, and overall even the slough buildup is minimal compared to what we have seen previously, I feel like she is actually showing signs of excellent improvement today. 09/19/2019 on evaluation today patient appears to  be doing well with regard to her wounds on evaluation today. She is showing signs of continued improvement week by week and everything is measuring significantly smaller which is great news. Overall very pleased with the progress that she has made. 09/26/2019 on evaluation today patient appears to be doing fairly well with regard to her right lower extremity ulcer. She does continue to have some drainage and again the smaller of the 2 wounds is not making as much progress as I like to see. We have been doing the silver collagen for some time I think we may want to switch to [facility] she has a little bit of hyper granular tissue this may help to keep things from becoming too dramatic in that regard. Fortunately there is no signs of infection at this time. 10/03/2019 on evaluation today patient  appears to be doing fairly well with regard to her lower extremity ulcers at this time. She has been tolerating the dressing changes without complication. Fortunately the wound is measuring smaller which is great news she continues to make great progress. 10/17/2019 on evaluation today patient appears to be doing well with regard to her wounds of her right lower extremity. She states she has been having some increased pain apparently we gave her a little bit smaller size Tubigrip last time she was here this may have been what was causing some issues. She is out of the Tubigrip that we had ordered for her and therefore did not have any of the smaller size. Fortunately there is no evidence of active infection at this time. No fever chills noted 10/24/2019 upon evaluation today patient's wounds actually appear to be showing signs of good improvement which is excellent news. She has been tolerating the dressing changes without complication and overall I am rather pleased at this point with how things seem to be progressing. The patient likewise is very happy. She still was not happy with having to use the Tubigrip she states that bunches up in her shoe. 10/31/2019 on evaluation today patient actually appears to be doing quite well with regard to her leg ulcer. She has been tolerating the dressing changes without complication. Fortunately there is no signs of active infection at this time. Overall I am very pleased with the progress that she is made. She does wonder if we could switch to the bordered adhesive Hydrofera Blue I think that something we can definitely look into for her. 11/15/2019 patient on evaluation today appears to be making excellent progress. Her wound is significantly smaller and the wound bed appears to be dramatically improved compared to even the last visit. Fortunately she is having really little to no pain at this time. No fevers, chills, nausea, vomiting, or diarrhea. 11/25/2019 on  evaluation today patient appears to be doing well with regard to her lower extremity ulcer. She has been tolerating dressing changes without complication. Fortunately there is no signs of active infection at this time. No fevers, chills, nausea, vomiting, or diarrhea. 12/02/2019 on evaluation today patient unfortunately is not doing quite as well as what we would have liked to have seen based on last week's evaluation. Fortunately there is no signs of active infection at this time. With that being said she is continuing to experience some issues with hyper granulation she was doing much better with the [facility] prior to putting the contact layer in between her skin and the [facility] I think that we need to go back to that. She just can have  to be very careful with removing the dressing in order to ensure it does not pull skin and get stuck. 12/16/2019 on evaluation today patient appears to be doing better her wound is measuring significantly smaller at this time compared to where things were previous. Fortunately there is no evidence of active infection at this point which is also good news. No fever chills noted. 12/23/2019 upon evaluation today patient appears to be doing excellent in regard to her lower extremity ulcer. She has been tolerating the dressing changes without complication. Fortunately there is no signs of active infection at this time. Overall I am very pleased with the fact that she is doing so well today. READMISSION 08/12/2020 Mrs. Mifflin is a now 88 year old woman we have had in this clinic on at least 2 occasions. She spent a protracted period of time here for a postsurgical wound on her left olecranon and then more recently towards the end of last year earlier this year was in for a skin tear on her left lower leg caused by her dog. She comes in today with a reasonably acute area on her left forearm again apparently caused by her dog although it was not a bite injury. She  was seen in urgent care and they managed to reoppose a lot of the skin tear although the patient states that is now reopened slightly. They have been using Neosporin. She has very fragile skin probably a combination of chronic steroid use for rheumatoid ESSICA, KIKER ([id number]) arthritis [although I do not see that she is on that now] solar skin damage. I note that she takes methotrexate and Plaquenil for her rheumatoid arthritis 9/15; this patient had a skin tear on her left forearm from trauma with her dog. She was seen in urgent care and they reapposed most of the separated skin. Fortunately for her most of this is remained intact and the skin above and below the wound actually looks healthy. Where there was a separation and the 2 flaps of skin there is still an open area however the granulation tissue looks healthy here. We have been using Xeroform however I had like to change to [facility] 9/29; skin tear on her left arm from a dog scratch. The skin was reapposed in an urgent care and surprisingly stayed viable. We used Hydrofera Blue last time the wound is smaller 10/13; patient comes back to clinic after 2 weeks for a left arm wound secondary to a dog scratch. The skin was reapposed and in urgent care. We have been using Hydrofera Blue and the wound is closed today. Readmissiono3/08/2022 Ms. Samanvitha Germany is an 88 year old female with a past medical history of chronic venous insufficiency and lymphedema, COPD, rheumatoid arthritis and hypertension that presents with a 2-week history of wounds to her lower extremities bilaterally. She states she had a skin tag to the right posterior leg that came off and became a wound. She states the area has slowly gotten larger. She was seen by her primary care physician's office on 02/15/2022 for this issue. She was prescribed Bactroban and cefdinir. She has 2 other wounds 1 located to each leg. These were from trauma. She states one was from trying  to put on compression stockings. Her ABIs in office on the left was 0.93 and 1.0 on the right. She currently denies systemic signs of infection. 02/24/2022 upon evaluation today patient appears to be doing well currently in regard to her skin tears. In general I feel like she is making  good progress. Fortunately I do not see any evidence of active infection locally or systemically which is great news. No fevers, chills, nausea, vomiting, or diarrhea. 03/04/2022 upon evaluation patient actually appears to be doing well in regard to the wounds on her legs. She has been tolerating the dressing changes without complication. Overall I think all are showing signs of improvement which is great news. 03/10/2022 upon evaluation today patient appears to be doing better in regard to her wounds in general. I am very pleased with where things stand. I do believe that she is showing signs of good improvement which is great news. No fevers, chills, nausea, vomiting, or diarrhea. 03-21-2022 upon evaluation today patient appears to be doing well with regard to her wound on the left leg and the anterior right leg both are showing signs of significant improvement. Unfortunately she has a lot of slough and biofilm buildup on the surface of the posterior wound which is not good. I do not see any signs of active infection locally or systemically which is great news. No fevers, chills, nausea, vomiting, or diarrhea. 03-29-2022 upon evaluation today patient appears to be doing well currently in regard to her wounds. She has been tolerating the dressing changes without complication. Fortunately there does not appear to be any signs of active infection at this time which is great news. No fevers, chills, nausea, vomiting, or diarrhea. 4/27; right posterior calf and left lateral calf both initially traumatic in the setting of significant venous insufficiency. She did not tolerate compression wraps apparently because she could not  get shoes on her feet and therefore we have been using Tubigrip's and silver alginate 5/8; right posterior calf 100% covered with adherent debris. The area on the left lateral really looks quite good under illumination islands of new epithelialization. We have been using Xeroform on the left Iodoflex on the right bilateral 2 layer Tubigrip's 04-28-2022 upon evaluation today patient appears to be doing well with regard to her wound she has been tolerating the dressing changes which is great news. Overall I feel like the Xeroform is doing well on the left leg this is pretty much healed on the right leg she is very close to being healed as well I think we can probably switch over to Xeroform here as well. 05-05-2022 upon evaluation today patient appears to be doing excellent in regard to her wounds both are showing signs of being very close to complete resolution which is awesome news. Fortunately I do not see any signs of active infection at this time which is great news and in general I think we are headed in the right direction. 05-13-2022 upon evaluation today patient appears to be doing well with regard to her wounds. She however does have some breakdown due to the fact that I think is staying too moist on the left lateral aspect. The right is also staying a little bit too moist in my opinion. Subsequently I think that we may want to switch the dressings currently. I would recommend switching over to a silver cell dressing. 6/13; apparently the patient was felt to be having home health by [facility] therefore she did not receive supplies for her wound care. Current dressing remained on for for 5 days. Removed today by her intake nurse with purulent drainage and the left lateral lower leg is considerably larger also slightly larger on the right. They are changing this usually every second day. At this point I am not really sure why the patient flagged  as having home health 05-31-2022 upon evaluation today  patient appears to be doing well with regard to her wounds in general. Fortunately I do not see any signs of active infection at this time systemically though locally she did have an infection last week and she is doing some better but was only able to take 3 days total of her medication. I will go ahead and get her sent in a prescription for doxycycline because the Bactrim was causing her stomach upset and I think probably the treatment matter is that she will benefit from at least 7 to 10 days of this doxycycline to make sure the infection is completely gone. 06-07-2022 upon evaluation today patient appears to be doing better. Her right leg is completely healed which is great news. The left leg is improving though not completely healed I think we are on the right track here still and I am very pleased with where things stand at this point. There does not appear to be any sign of infection at all at this time. 06-13-2022 upon evaluation today patient appears to be doing well with regard to her wound in fact this is showing signs of excellent improvement which is great news. Fortunately there does not appear to be any evidence of active infection locally or systemically at this time. No fevers, chills, nausea, vomiting, or diarrhea. 06-21-2022 upon evaluation today patient's wound on the right leg is still doing well the left leg had a lot of dry skin trapping fluid up underneath. Fortunately there does not appear to be any evidence of infection locally or systemically at this time which is great news and overall very pleased in that regard. Nonetheless I do believe that we need to try to get some of this away so that the alginate can collect the drainage. 06-30-2022 upon evaluation patient continues to have a wound that just seems to continue to morph and change as we progress here. I do not see any signs of infection systemically though locally I am seeing some issues here with erythema and minimal  warmth I think that she could benefit HADAR, ELGERSMA. ([id number]) from topical antibiotic therapy possibly with Jodie Echevaria initially I will probably use gentamicin while we obtain a culture and ensure we know what exactly we are dealing with here. 7/27; patient with a wound on her left anterior lower leg she is complaining of pain although this does not look to be too much different from what our staff is used to seeing. The wounds are superficial and not draining. A culture was done of the left lower leg wound last week which showed MRSA. She is allergic to doxycycline and Bactrim interacts with methotrexate. I therefore prescribed linezolid" and E scribed that to pharmacy on 7/24. Unfortunately this was not communicated to the patient and I have expressed my regret about this.Fortunately I do not know the delay is caused any further difficulties 07-21-2022 upon evaluation today patient appears to be doing well currently in regard to the wound on her left leg. This is pretty much about completely healed which is great news I do believe the linezolid following the culture was good for her and has made a big difference here. Fortunately I do not see any signs of active infection locally or systemically at this time which is great news. No fevers, chills, nausea, vomiting, or diarrhea. 08-22-2022 upon evaluation today patient appears to be doing excellent in fact she is completely healed which is great news. Fortunately I do  not see any evidence of active infection locally or systemically at this point also excellent. Electronic Signature(s) Signed: 08/22/2022 3:53:27 PM By: Lenda Kelp PA-C Entered By: Lenda Kelp on 08/22/2022 15:53:27 [name] ([id number]) -------------------------------------------------------------------------------- Physical Exam Details Patient Name: [name] Date of Service: 08/22/2022 1:15 PM Medical Record Number: [id number] Patient Account Number:  [id number] Date of Birth/Sex: 1930/02/27 (88 y.o. F) Treating RN: Yevonne Pax Primary Care Provider: Aram Beecham Other Clinician: Referring Provider: Aram Beecham Treating Provider/Extender: Rowan Blase in Treatment: 26 Constitutional Well-nourished and well-hydrated in no acute distress. Respiratory normal breathing without difficulty. Psychiatric this patient is able to make decisions and demonstrates good insight into disease process. Alert and Oriented x 3. pleasant and cooperative. Notes Patient's wound bed actually showed signs of good granulation and epithelization at this point. Fortunately I see no signs of anything open everything is completely epithelialized she has been using her compression socks she could wear the ones that she got from elastic therapy she states there was no way she can get them on. Nonetheless her legs seem to be doing well with her current compression stockings they are much less firm but at the same time if she can wear them without complication that is better than nothing. Electronic Signature(s) Signed: 08/22/2022 3:53:56 PM By: Lenda Kelp PA-C Entered By: Lenda Kelp on 08/22/2022 15:53:55 [name] ([id number]) -------------------------------------------------------------------------------- Physician Orders Details Patient Name: [name] Date of Service: 08/22/2022 1:15 PM Medical Record Number: [id number] Patient Account Number: [id number] Date of Birth/Sex: 1930/08/13 (88 y.o. F) Treating RN: Yevonne Pax Primary Care Provider: Aram Beecham Other Clinician: Referring Provider: Aram Beecham Treating Provider/Extender: Rowan Blase in Treatment: 85 Verbal / Phone Orders: No Diagnosis Coding ICD-10 Coding Code Description [phone] Laceration without foreign body, left lower leg, initial encounter L97.812 Non-pressure chronic ulcer of other part of right lower leg with fat layer exposed L97.822  Non-pressure chronic ulcer of other part of left lower leg with fat layer exposed I87.2 Venous insufficiency (chronic) (peripheral) B95.61 Methicillin susceptible Staphylococcus aureus infection as the cause of diseases classified elsewhere Discharge From [facility] Services o Discharge from [facility] Treatment Complete o Wear compression garments daily. Put garments on first thing when you wake up and remove them before bed. o Moisturize legs daily after removing compression garments. Electronic Signature(s) Signed: 08/22/2022 4:14:55 PM By: Yevonne Pax RN Signed: 08/23/2022 5:16:25 PM By: Lenda Kelp PA-C Entered By: Yevonne Pax on 08/22/2022 13:57:47 [name] ([id number]) -------------------------------------------------------------------------------- Problem List Details Patient Name: [name] Date of Service: 08/22/2022 1:15 PM Medical Record Number: [id number] Patient Account Number: [id number] Date of Birth/Sex: 1930-09-17 (88 y.o. F) Treating RN: Yevonne Pax Primary Care Provider: Aram Beecham Other Clinician: Referring Provider: Aram Beecham Treating Provider/Extender: Rowan Blase in Treatment: 26 Active Problems ICD-10 Encounter Code Description Active Date MDM Diagnosis S81.812A Laceration without foreign body, left lower leg, initial encounter 02/17/2022 No Yes L97.812 Non-pressure chronic ulcer of other part of right lower leg with fat layer 02/17/2022 No Yes exposed L97.822 Non-pressure chronic ulcer of other part of left lower leg with fat layer 02/17/2022 No Yes exposed I87.2 Venous insufficiency (chronic) (peripheral) 02/17/2022 No Yes B95.61 Methicillin susceptible Staphylococcus aureus infection as the cause of 07/07/2022 No Yes diseases classified elsewhere Inactive Problems ICD-10 Code Description Active Date Inactive Date S81.811A Laceration without foreign body, right lower leg, initial encounter 02/17/2022 02/17/2022 Resolved  Problems Electronic Signature(s) Signed: 08/22/2022 1:48:19 PM  By: Lenda Kelp PA-C Entered By: Lenda Kelp on 08/22/2022 13:48:18 [name] ([id number]) -------------------------------------------------------------------------------- Progress Note Details Patient Name: [name] Date of Service: 08/22/2022 1:15 PM Medical Record Number: [id number] Patient Account Number: [id number] Date of Birth/Sex: 04/06/1930 (88 y.o. F) Treating RN: Yevonne Pax Primary Care Provider: Aram Beecham Other Clinician: Referring Provider: Aram Beecham Treating Provider/Extender: Rowan Blase in Treatment: 26 Subjective Chief Complaint Information obtained from Patient Right leg skin tear due to A dog scratch 08/12/2020; patient returns to clinic with a skin tear on her left forearm for our review 02/17/2022; patient presents with multiple open wounds to her lower extremities bilaterally due to trauma. History of Present Illness (HPI) 88 year old patient who is looking much younger than his stated age comes in with a history of having a laceration to her left lower extremity which she sustained about a week ago. She has several medical comorbidities including degenerative arthritis, scoliosis, history of back surgery, pacemaker placement,AMA positive, ulnar neuropathy and left carpal tunnel syndrome. she is also had sclerotherapy for varicose veins in May 2003. her medications include some prednisone at the present time which she may be coming off soon. She went to the [facility] where they have been dressing her wound and she is hear for review. 08/18/2016 -- a small traumatic ulceration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 actually appears to be doing quite well with regard to her right lower extremity. She has been tolerating the dressing changes without complication. Fortunately there is no signs of active infection at this time. She still complains about the Tubigrip but nonetheless I think this is something that is of utmost importance for her to continue to wear if she is going to see this area heal appropriately. 08/29/2019 on evaluation today patient actually appears to be doing well visually in regard to her wound. Unfortunately in regard to overall pain she seems to be having more pain today which I am somewhat concerned about. There does not appear to be any signs of active infection that I can tell but again with increased pain that is definitely a concern here today. I do think it may be time to switch up the dressing as well to something that will be a little bit more effective hopefully and new tissue growth she has done well with collagen in the past. 09/05/2019 on evaluation today patient actually appears to be doing well with regard to her leg ulcer. She has been tolerating the dressing changes without complication. Fortunately there is no signs of active infection. We did obtain a wound culture last week due to the fact that she still is very touchy as far as the surface of the wound is concerned. Unfortunately I am not sure what happened to that culture as there is no record in the computer system. Nonetheless we need to follow-up on this and try to see if we can identify what exactly  happened with this specimen as we have not gotten a result back either obviously. 09/12/2019 on evaluation today patient actually appears to be showing signs of good improvement upon evaluation today. She states her pain is not nearly as bad as what it was which is also good news. She did have a wound culture which was reviewed today that showed no growth of bacteria which is good news. This coupled with the fact that her wound appears to be healing better, measuring smaller, and overall even the slough buildup is minimal compared to what we have seen previously, I feel like she is actually showing signs of excellent improvement today. 09/19/2019 on evaluation today patient appears to be doing well with regard to her wounds on evaluation today. She is showing signs of continued improvement week by week and everything is measuring significantly smaller which is great news. Overall very pleased with the progress that she has made. 09/26/2019 on evaluation today patient appears to be doing fairly well with regard to her right lower extremity ulcer. She does continue to have some  drainage and again the smaller of the 2 wounds is not making as much progress as I like to see. We have been doing the silver collagen for some time I think we may want to switch to [facility] she has a little bit of hyper granular tissue this may help to keep things from becoming too dramatic in that regard. Fortunately there is no signs of infection at this time. 10/03/2019 on evaluation today patient appears to be doing fairly well with regard to her lower extremity ulcers at this time. She has been tolerating the dressing changes without complication. Fortunately the wound is measuring smaller which is great news she continues to make great progress. 10/17/2019 on evaluation today patient appears to be doing well with regard to her wounds of her right lower extremity. She states she has been having some increased pain  apparently we gave her a little bit smaller size Tubigrip last time she was here this may have been what was causing some issues. She is out of the Tubigrip that we had ordered for her and therefore did not have any of the smaller size. Fortunately there is no evidence of active infection at this time. No fever chills noted 10/24/2019 upon evaluation today patient's wounds actually appear to be showing signs of good improvement which is excellent news. She has been tolerating the dressing changes without complication and overall I am rather pleased at this point with how things seem to be progressing. The patient likewise is very happy. She still was not happy with having to use the Tubigrip she states that bunches up in her shoe. 10/31/2019 on evaluation today patient actually appears to be doing quite well with regard to her leg ulcer. She has been tolerating the dressing changes without complication. Fortunately there is no signs of active infection at this time. Overall I am very pleased with the progress that she is made. She does wonder if we could switch to the bordered adhesive Hydrofera Blue I think that something we can definitely look into for her. 11/15/2019 patient on evaluation today appears to be making excellent progress. Her wound is significantly smaller and the wound bed appears to be dramatically improved compared to even the last visit. Fortunately she is having really little to no pain at this time. No fevers, chills, nausea, vomiting, or diarrhea. 11/25/2019 on evaluation today patient appears to be doing well with regard to her lower extremity ulcer. She has been tolerating dressing changes without complication. Fortunately there is no signs of active infection at this time. No fevers, chills, nausea, vomiting, or diarrhea. 12/02/2019 on evaluation today patient unfortunately is not doing quite as well as what we would have liked to have seen based on last week's evaluation.  Fortunately there is no signs of active infection at this time. With that being said she is continuing to experience some issues with hyper granulation she was doing much better with the [facility] prior to putting the contact layer in between her skin and the [facility] I think that we need to go back to that. She just can have to be very careful with removing the dressing in order to ensure it does not pull skin and get stuck. 12/16/2019 on evaluation today patient appears to be doing better her wound is measuring significantly smaller at this time compared to where things were previous. Fortunately there is no evidence of active infection at this point which is also good news. No fever chills noted. 12/23/2019  upon evaluation today patient appears to be doing excellent in regard to her lower extremity ulcer. She has been tolerating the dressing changes without complication. Fortunately there is no signs of active infection at this time. Overall I am very pleased with the fact that she is doing so well today. READMISSION 08/12/2020 [name] ([id number]) Mrs. Burtt is a now 88 year old woman we have had in this clinic on at least 2 occasions. She spent a protracted period of time here for a postsurgical wound on her left olecranon and then more recently towards the end of last year earlier this year was in for a skin tear on her left lower leg caused by her dog. She comes in today with a reasonably acute area on her left forearm again apparently caused by her dog although it was not a bite injury. She was seen in urgent care and they managed to reoppose a lot of the skin tear although the patient states that is now reopened slightly. They have been using Neosporin. She has very fragile skin probably a combination of chronic steroid use for rheumatoid arthritis [although I do not see that she is on that now] solar skin damage. I note that she takes methotrexate and Plaquenil for her  rheumatoid arthritis 9/15; this patient had a skin tear on her left forearm from trauma with her dog. She was seen in urgent care and they reapposed most of the separated skin. Fortunately for her most of this is remained intact and the skin above and below the wound actually looks healthy. Where there was a separation and the 2 flaps of skin there is still an open area however the granulation tissue looks healthy here. We have been using Xeroform however I had like to change to [facility] 9/29; skin tear on her left arm from a dog scratch. The skin was reapposed in an urgent care and surprisingly stayed viable. We used Hydrofera Blue last time the wound is smaller 10/13; patient comes back to clinic after 2 weeks for a left arm wound secondary to a dog scratch. The skin was reapposed and in urgent care. We have been using Hydrofera Blue and the wound is closed today. Readmission 02/17/2022 Ms. Lalanya Rufener is an 88 year old female with a past medical history of chronic venous insufficiency and lymphedema, COPD, rheumatoid arthritis and hypertension that presents with a 2-week history of wounds to her lower extremities bilaterally. She states she had a skin tag to the right posterior leg that came off and became a wound. She states the area has slowly gotten larger. She was seen by her primary care physician's office on 02/15/2022 for this issue. She was prescribed Bactroban and cefdinir. She has 2 other wounds 1 located to each leg. These were from trauma. She states one was from trying to put on compression stockings. Her ABIs in office on the left was 0.93 and 1.0 on the right. She currently denies systemic signs of infection. 02/24/2022 upon evaluation today patient appears to be doing well currently in regard to her skin tears. In general I feel like she is making good progress. Fortunately I do not see any evidence of active infection locally or systemically which is great news. No fevers,  chills, nausea, vomiting, or diarrhea. 03/04/2022 upon evaluation patient actually appears to be doing well in regard to the wounds on her legs. She has been tolerating the dressing changes without complication. Overall I think all are showing signs of improvement which is great  news. 03/10/2022 upon evaluation today patient appears to be doing better in regard to her wounds in general. I am very pleased with where things stand. I do believe that she is showing signs of good improvement which is great news. No fevers, chills, nausea, vomiting, or diarrhea. 03-21-2022 upon evaluation today patient appears to be doing well with regard to her wound on the left leg and the anterior right leg both are showing signs of significant improvement. Unfortunately she has a lot of slough and biofilm buildup on the surface of the posterior wound which is not good. I do not see any signs of active infection locally or systemically which is great news. No fevers, chills, nausea, vomiting, or diarrhea. 03-29-2022 upon evaluation today patient appears to be doing well currently in regard to her wounds. She has been tolerating the dressing changes without complication. Fortunately there does not appear to be any signs of active infection at this time which is great news. No fevers, chills, nausea, vomiting, or diarrhea. 4/27; right posterior calf and left lateral calf both initially traumatic in the setting of significant venous insufficiency. She did not tolerate compression wraps apparently because she could not get shoes on her feet and therefore we have been using Tubigrip's and silver alginate 5/8; right posterior calf 100% covered with adherent debris. The area on the left lateral really looks quite good under illumination islands of new epithelialization. We have been using Xeroform on the left Iodoflex on the right bilateral 2 layer Tubigrip's 04-28-2022 upon evaluation today patient appears to be doing well with  regard to her wound she has been tolerating the dressing changes which is great news. Overall I feel like the Xeroform is doing well on the left leg this is pretty much healed on the right leg she is very close to being healed as well I think we can probably switch over to Xeroform here as well. 05-05-2022 upon evaluation today patient appears to be doing excellent in regard to her wounds both are showing signs of being very close to complete resolution which is awesome news. Fortunately I do not see any signs of active infection at this time which is great news and in general I think we are headed in the right direction. 05-13-2022 upon evaluation today patient appears to be doing well with regard to her wounds. She however does have some breakdown due to the fact that I think is staying too moist on the left lateral aspect. The right is also staying a little bit too moist in my opinion. Subsequently I think that we may want to switch the dressings currently. I would recommend switching over to a silver cell dressing. 6/13; apparently the patient was felt to be having home health by [facility] therefore she did not receive supplies for her wound care. Current dressing remained on for for 5 days. Removed today by her intake nurse with purulent drainage and the left lateral lower leg is considerably larger also slightly larger on the right. They are changing this usually every second day. At this point I am not really sure why the patient flagged as having home health 05-31-2022 upon evaluation today patient appears to be doing well with regard to her wounds in general. Fortunately I do not see any signs of active infection at this time systemically though locally she did have an infection last week and she is doing some better but was only able to take 3 days total of her medication. I will go  ahead and get her sent in a prescription for doxycycline because the Bactrim was causing her stomach upset and I  think probably the treatment matter is that she will benefit from at least 7 to 10 days of this doxycycline to make sure the infection is completely gone. 06-07-2022 upon evaluation today patient appears to be doing better. Her right leg is completely healed which is great news. The left leg is improving though not completely healed I think we are on the right track here still and I am very pleased with where things stand at this point. There does not appear to be any sign of infection at all at this time. 06-13-2022 upon evaluation today patient appears to be doing well with regard to her wound in fact this is showing signs of excellent improvement which is great news. Fortunately there does not appear to be any evidence of active infection locally or systemically at this time. No fevers, chills, nausea, vomiting, or diarrhea. CURLEY, FAYETTE ([id number]) 06-21-2022 upon evaluation today patient's wound on the right leg is still doing well the left leg had a lot of dry skin trapping fluid up underneath. Fortunately there does not appear to be any evidence of infection locally or systemically at this time which is great news and overall very pleased in that regard. Nonetheless I do believe that we need to try to get some of this away so that the alginate can collect the drainage. 06-30-2022 upon evaluation patient continues to have a wound that just seems to continue to morph and change as we progress here. I do not see any signs of infection systemically though locally I am seeing some issues here with erythema and minimal warmth I think that she could benefit from topical antibiotic therapy possibly with Jodie Echevaria initially I will probably use gentamicin while we obtain a culture and ensure we know what exactly we are dealing with here. 7/27; patient with a wound on her left anterior lower leg she is complaining of pain although this does not look to be too much different from what our staff is used to  seeing. The wounds are superficial and not draining. A culture was done of the left lower leg wound last week which showed MRSA. She is allergic to doxycycline and Bactrim interacts with methotrexate. I therefore prescribed linezolid" and E scribed that to pharmacy on 7/24. Unfortunately this was not communicated to the patient and I have expressed my regret about this.Fortunately I do not know the delay is caused any further difficulties 07-21-2022 upon evaluation today patient appears to be doing well currently in regard to the wound on her left leg. This is pretty much about completely healed which is great news I do believe the linezolid following the culture was good for her and has made a big difference here. Fortunately I do not see any signs of active infection locally or systemically at this time which is great news. No fevers, chills, nausea, vomiting, or diarrhea. 08-22-2022 upon evaluation today patient appears to be doing excellent in fact she is completely healed which is great news. Fortunately I do not see any evidence of active infection locally or systemically at this point also excellent. Objective Constitutional Well-nourished and well-hydrated in no acute distress. Vitals Time Taken: 1:31 PM, Height: 60 in, Weight: 132 lbs, BMI: 25.8, Temperature: 98.1 F, Pulse: 69 bpm, Respiratory Rate: 18 breaths/min, Blood Pressure: 173/80 mmHg. Respiratory normal breathing without difficulty. Psychiatric this patient is able to make decisions and  demonstrates good insight into disease process. Alert and Oriented x 3. pleasant and cooperative. General Notes: Patient's wound bed actually showed signs of good granulation and epithelization at this point. Fortunately I see no signs of anything open everything is completely epithelialized she has been using her compression socks she could wear the ones that she got from elastic therapy she states there was no way she can get them on.  Nonetheless her legs seem to be doing well with her current compression stockings they are much less firm but at the same time if she can wear them without complication that is better than nothing. Integumentary (Hair, Skin) Wound #13 status is Healed - Epithelialized. Original cause of wound was Trauma. The date acquired was: 02/09/2022. The wound has been in treatment 26 weeks. The wound is located on the Left Lower Leg. The wound measures 0cm length x 0cm width x 0cm depth; 0cm^2 area and 0cm^3 volume. There is no tunneling or undermining noted. There is a none present amount of drainage noted. There is no granulation within the wound bed. There is no necrotic tissue within the wound bed. Assessment Active Problems ICD-10 Laceration without foreign body, left lower leg, initial encounter Non-pressure chronic ulcer of other part of right lower leg with fat layer exposed Non-pressure chronic ulcer of other part of left lower leg with fat layer exposed Venous insufficiency (chronic) (peripheral) Methicillin susceptible Staphylococcus aureus infection as the cause of diseases classified elsewhere GARI, TROVATO ([id number]) Plan Discharge From [facility] Services: Discharge from Wound Care Center Treatment Complete Wear compression garments daily. Put garments on first thing when you wake up and remove them before bed. Moisturize legs daily after removing compression garments. 1. Would recommend that we go ahead and continue with the wound care measures as before and the patient is in agreement with plan this is in regard to the use of the compression stockings which I do think she should pretty much have on all the time. She is going to wear them throughout the day and off at bedtime only. 2. I am also can recommend that we have the patient continue to elevate her legs much as possible obviously I think this is important for her to still continue to keep a close eye on. We will see her back for  follow-up visit as needed. Electronic Signature(s) Signed: 08/22/2022 3:54:37 PM By: Lenda Kelp PA-C Entered By: Lenda Kelp on 08/22/2022 15:54:36 [name] ([id number]) -------------------------------------------------------------------------------- SuperBill Details Patient Name: [name] Date of Service: 08/22/2022 Medical Record Number: [id number] Patient Account Number: [id number] Date of Birth/Sex: 23-Jan-1930 (88 y.o. F) Treating RN: Yevonne Pax Primary Care Provider: Aram Beecham Other Clinician: Referring Provider: Aram Beecham Treating Provider/Extender: Rowan Blase in Treatment: 26 Diagnosis Coding ICD-10 Codes Code Description [phone] Laceration without foreign body, left lower leg, initial encounter L97.812 Non-pressure chronic ulcer of other part of right lower leg with fat layer exposed L97.822 Non-pressure chronic ulcer of other part of left lower leg with fat layer exposed I87.2 Venous insufficiency (chronic) (peripheral) B95.61 Methicillin susceptible Staphylococcus aureus infection as the cause of diseases classified elsewhere Facility Procedures CPT4 Code: [id number] Description: [phone] - WOUND CARE VISIT-LEV 2 EST PT Modifier: Quantity: 1 Physician Procedures CPT4 Code: [id number] Description: 99213 - WC PHYS LEVEL 3 - EST PT Modifier: Quantity: 1 CPT4 Code: Description: ICD-10 Diagnosis Description S81.812A Laceration without foreign body, left lower leg, initial encounter L97.812 Non-pressure chronic ulcer of other part of right lower leg  with fat la L97.822 Non-pressure chronic ulcer of other part of left  lower leg with fat lay I87.2 Venous insufficiency (chronic) (peripheral) Modifier: yer exposed er exposed Quantity: Electronic Signature(s) Signed: 08/22/2022 4:00:07 PM By: Lenda Kelp PA-C Entered By: Lenda Kelp on 08/22/2022 16:00:07

## 2022-08-22 NOTE — Progress Notes (Signed)
[name], [name] ([id number]) Visit Report for 08/22/2022 Arrival Information Details Patient Name: [name], [name] Date of Service: 08/22/2022 1:15 PM Medical Record Number: [id number] Patient Account Number: [id number] Date of Birth/Sex: 1930/04/21 (88 y.o. F) Treating RN: Yevonne Pax Primary Care Ayana Imhof: Aram Beecham Other Clinician: Referring Oakley Orban: Aram Beecham Treating Jerre Diguglielmo/Extender: Rowan Blase in Treatment: 26 Visit Information History Since Last Visit All ordered tests and consults were completed: No Patient Arrived: Wheel Chair Added or deleted any medications: No Arrival Time: 13:26 Any new allergies or adverse reactions: No Accompanied By: caregiver Had a fall or experienced change in No Transfer Assistance: None activities of daily living that may affect Patient Identification Verified: Yes risk of falls: Secondary Verification Process Completed: Yes Signs or symptoms of abuse/neglect since last visito No Patient Requires Transmission-Based Precautions: No Hospitalized since last visit: No Patient Has Alerts: Yes Implantable device outside of the clinic excluding No Patient Alerts: NOT diabetic cellular tissue based products placed in the center since last visit: Has Dressing in Place as Prescribed: Yes Has Compression in Place as Prescribed: Yes Pain Present Now: No Electronic Signature(s) Signed: 08/22/2022 4:14:55 PM By: Yevonne Pax RN Entered By: Yevonne Pax on 08/22/2022 13:31:40 [name] ([id number]) -------------------------------------------------------------------------------- Clinic Level of Care Assessment Details Patient Name: [name] Date of Service: 08/22/2022 1:15 PM Medical Record Number: [id number] Patient Account Number: [id number] Date of Birth/Sex: 1930/07/07 (88 y.o. F) Treating RN: Yevonne Pax Primary Care Querida Beretta: Aram Beecham Other Clinician: Referring Llewyn Heap: Aram Beecham Treating  Mylei Brackeen/Extender: Rowan Blase in Treatment: 26 Clinic Level of Care Assessment Items TOOL 4 Quantity Score X - Use when only an EandM is performed on FOLLOW-UP visit 1 0 ASSESSMENTS - Nursing Assessment / Reassessment X - Reassessment of Co-morbidities (includes updates in patient status) 1 10 X- 1 5 Reassessment of Adherence to Treatment Plan ASSESSMENTS - Wound and Skin Assessment / Reassessment X - Simple Wound Assessment / Reassessment - one wound 1 5 []  - 0 Complex Wound Assessment / Reassessment - multiple wounds []  - 0 Dermatologic / Skin Assessment (not related to wound area) ASSESSMENTS - Focused Assessment []  - Circumferential Edema Measurements - multi extremities 0 []  - 0 Nutritional Assessment / Counseling / Intervention []  - 0 Lower Extremity Assessment (monofilament, tuning fork, pulses) []  - 0 Peripheral Arterial Disease Assessment (using hand held doppler) ASSESSMENTS - Ostomy and/or Continence Assessment and Care []  - Incontinence Assessment and Management 0 []  - 0 Ostomy Care Assessment and Management (repouching, etc.) PROCESS - Coordination of Care X - Simple Patient / Family Education for ongoing care 1 15 []  - 0 Complex (extensive) Patient / Family Education for ongoing care []  - 0 Staff obtains , Records, Test Results / Process Orders []  - 0 Staff telephones HHA, Nursing Homes / Clarify orders / etc []  - 0 Routine Transfer to another Facility (non-emergent condition) []  - 0 Routine Hospital Admission (non-emergent condition) []  - 0 New Admissions / / Ordering NPWT, Apligraf, etc. []  - 0 Emergency Hospital Admission (emergent condition) X- 1 10 Simple Discharge Coordination []  - 0 Complex (extensive) Discharge Coordination PROCESS - Special Needs []  - Pediatric / Minor Patient Management 0 []  - 0 Isolation Patient Management []  - 0 Hearing / Language / Visual special needs []  - 0 Assessment of  Community assistance (transportation, D/C planning, etc.) []  - 0 Additional assistance / Altered mentation []  - 0 Support Surface(s) Assessment (bed, cushion, seat, etc.) INTERVENTIONS - Wound Cleansing /  Measurement [name], [name] ([id number]) X- 1 5 Simple Wound Cleansing - one wound []  - 0 Complex Wound Cleansing - multiple wounds X- 1 5 Wound Imaging (photographs - any number of wounds) []  - 0 Wound Tracing (instead of photographs) X- 1 5 Simple Wound Measurement - one wound []  - 0 Complex Wound Measurement - multiple wounds INTERVENTIONS - Wound Dressings []  - Small Wound Dressing one or multiple wounds 0 []  - 0 Medium Wound Dressing one or multiple wounds []  - 0 Large Wound Dressing one or multiple wounds []  - 0 Application of Medications - topical []  - 0 Application of Medications - injection INTERVENTIONS - Miscellaneous []  - External ear exam 0 []  - 0 Specimen Collection (cultures, biopsies, blood, body fluids, etc.) []  - 0 Specimen(s) / Culture(s) sent or taken to Lab for analysis []  - 0 Patient Transfer (multiple staff / / Similar devices) []  - 0 Simple Staple / Suture removal (25 or less) []  - 0 Complex Staple / Suture removal (26 or more) []  - 0 Hypo / Hyperglycemic Management (close monitor of Blood Glucose) []  - 0 Ankle / Brachial Index (ABI) - do not check if billed separately X- 1 5 Vital Signs Has the patient been seen at the hospital within the last three years: Yes Total Score: 65 Level Of Care: New/Established - Level 2 Electronic Signature(s) Signed: 08/22/2022 4:14:55 PM By: RN Entered By: on 08/22/2022 13:58:14 ( ) -------------------------------------------------------------------------------- Encounter Discharge Information Details Patient Name: Date of Service: 08/22/2022 1:15 PM Medical Record Number: Patient Account Number: Date of  Birth/Sex: [birth year] (88 y.o. F) Treating RN: Primary Care Rebeccah Ivins: Other Clinician: Referring Maylene Crocker: Treating Shenelle Klas/Extender: in Treatment: 26 Encounter Discharge Information Items Discharge Condition: Stable Ambulatory Status: Wheelchair Discharge Destination: Home Transportation: Private Auto Accompanied By: caregiver Schedule Follow-up Appointment: Yes Clinical Summary of Care: Electronic Signature(s) Signed: 08/22/2022 4:14:55 PM By: Yevonne Pax RN Entered By: 10/22/2022 on 08/22/2022 13:59:00 [id number] ([name]) -------------------------------------------------------------------------------- Lower Extremity Assessment Details Patient Name: 10/22/2022 Date of Service: 08/22/2022 1:15 PM Medical Record Number: [id number] Patient Account Number: 13/05/1930 Date of Birth/Sex: 10-May-1930 (88 y.o. F) Treating RN: Aram Beecham Primary Care Aryn Safran: Aram Beecham Other Clinician: Referring Oviya Ammar: Rowan Blase Treating Jessicalynn Deshong/Extender: 27 in Treatment: 26 Edema Assessment Assessed: [Left: No] [Right: No] Edema: [Left: Ye] [Right: s] Calf Left: Right: Point of Measurement: 32 cm From Medial Instep 30 cm Ankle Left: Right: Point of Measurement: 10 cm From Medial Instep 21 cm Vascular Assessment Pulses: Dorsalis Pedis Palpable: [Left:Yes] Electronic Signature(s) Signed: 08/22/2022 4:14:55 PM By: Yevonne Pax RN Entered By: Yevonne Pax on 08/22/2022 13:43:43 [name] ([id number]) -------------------------------------------------------------------------------- Multi Wound Chart Details Patient Name: [name] Date of Service: 08/22/2022 1:15 PM Medical Record Number: [id number] Patient Account Number: [id number] Date of Birth/Sex: 1930-05-24 (88 y.o. F) Treating RN: Yevonne Pax Primary Care Shante Archambeault: Aram Beecham Other Clinician: Referring  Farhaan Mabee: Aram Beecham Treating Herby Amick/Extender: Rowan Blase in Treatment: 26 Vital Signs Height(in): 60 Pulse(bpm): 69 Weight(lbs): 132 Blood Pressure(mmHg): 173/80 Body Mass Index(BMI): 25.8 Temperature(F): 98.1 Respiratory Rate(breaths/min): 18 Photos: [N/A:N/A] Wound Location: Left Lower Leg N/A N/A Wounding Event: Trauma N/A N/A Primary Etiology: Skin Tear N/A N/A Comorbid History: Cataracts, Glaucoma, Optic N/A N/A Neuritis, Chronic sinus problems/congestion, Middle ear problems, Hypertension, Rheumatoid Arthritis Date Acquired: 02/09/2022 N/A N/A Weeks of Treatment: 26 N/A  N/A Wound Status: Healed - Epithelialized N/A N/A Wound Recurrence: No N/A N/A Measurements L x W x D (cm) 0x0x0 N/A N/A Area (cm) : 0 N/A N/A Volume (cm) : 0 N/A N/A % Reduction in Area: 100.00% N/A N/A % Reduction in Volume: 100.00% N/A N/A Classification: Full Thickness Without Exposed N/A N/A Support Structures Exudate Amount: None Present N/A N/A Granulation Amount: None Present (0%) N/A N/A Necrotic Amount: None Present (0%) N/A N/A Exposed Structures: Fascia: No N/A N/A Fat Layer (Subcutaneous Tissue): No Tendon: No Muscle: No Joint: No Bone: No Epithelialization: Large (67-100%) N/A N/A Treatment Notes Electronic Signature(s) Signed: 08/22/2022 4:14:55 PM By: Yevonne Pax RN Entered By: Yevonne Pax on 08/22/2022 13:54:50 [name] ([id number]) -------------------------------------------------------------------------------- Multi-Disciplinary Care Plan Details Patient Name: [name] Date of Service: 08/22/2022 1:15 PM Medical Record Number: [id number] Patient Account Number: [id number] Date of Birth/Sex: [birth year] (88 y.o. F) Treating RN: Yevonne Pax Primary Care Pinkney Venard: Aram Beecham Other Clinician: Referring Huzaifa Viney: Aram Beecham Treating Brooklinn Longbottom/Extender: Rowan Blase in Treatment: 26 Active Inactive Electronic  Signature(s) Signed: 08/22/2022 4:14:55 PM By: Yevonne Pax RN Entered By: Yevonne Pax on 08/22/2022 13:54:42 [name] ([id number]) -------------------------------------------------------------------------------- Pain Assessment Details Patient Name: [name] Date of Service: 08/22/2022 1:15 PM Medical Record Number: [id number] Patient Account Number: [id number] Date of Birth/Sex: [birth year] (88 y.o. F) Treating RN: Yevonne Pax Primary Care Rishaan Gunner: Aram Beecham Other Clinician: Referring Annalei Friesz: Aram Beecham Treating Brayleigh Rybacki/Extender: Rowan Blase in Treatment: 26 Active Problems Location of Pain Severity and Description of Pain Patient Has Paino No Site Locations Pain Management and Medication Current Pain Management: Electronic Signature(s) Signed: 08/22/2022 4:14:55 PM By: Yevonne Pax RN Entered By: Yevonne Pax on 08/22/2022 13:32:14 [name] ([id number]) -------------------------------------------------------------------------------- Patient/Caregiver Education Details Patient Name: [name] Date of Service: 08/22/2022 1:15 PM Medical Record Number: [id number] Patient Account Number: [id number] Date of Birth/Gender: 05-29-1930 (88 y.o. F) Treating RN: Yevonne Pax Primary Care Physician: Aram Beecham Other Clinician: Referring Physician: Aram Beecham Treating Physician/Extender: Rowan Blase in Treatment: 39 Education Assessment Education Provided To: Patient Education Topics Provided Wound/Skin Impairment: Methods: Explain/Verbal Responses: State content correctly Electronic Signature(s) Signed: 08/22/2022 4:14:55 PM By: Yevonne Pax RN Entered By: Yevonne Pax on 08/22/2022 13:58:30 [name] ([id number]) -------------------------------------------------------------------------------- Wound Assessment Details Patient Name: [name] Date of Service: 08/22/2022 1:15 PM Medical Record Number:  [id number] Patient Account Number: [id number] Date of Birth/Sex: February 05, 1930 (88 y.o. F) Treating RN: Yevonne Pax Primary Care Matej Sappenfield: Aram Beecham Other Clinician: Referring Rosie Torrez: Aram Beecham Treating Tamre Cass/Extender: Rowan Blase in Treatment: 26 Wound Status Wound Number: 13 Primary Skin Tear Etiology: Wound Location: Left Lower Leg Wound Healed - Epithelialized Wounding Event: Trauma Status: Date Acquired: 02/09/2022 Comorbid Cataracts, Glaucoma, Optic Neuritis, Chronic sinus Weeks Of Treatment: 26 History: problems/congestion, Middle ear problems, Hypertension, Clustered Wound: No Rheumatoid Arthritis Photos Wound Measurements Length: (cm) 0 Width: (cm) 0 Depth: (cm) 0 Area: (cm) 0 Volume: (cm) 0 % Reduction in Area: 100% % Reduction in Volume: 100% Epithelialization: Large (67-100%) Tunneling: No Undermining: No Wound Description Classification: Full Thickness Without Exposed Support Structure Exudate Amount: None Present s Foul Odor After Cleansing: No Slough/Fibrino No Wound Bed Granulation Amount: None Present (0%) Exposed Structure Necrotic Amount: None Present (0%) Fascia Exposed: No Fat Layer (Subcutaneous Tissue) Exposed: No Tendon Exposed: No Muscle Exposed: No Joint Exposed: No Bone Exposed: No Treatment Notes Wound #13 (Lower Leg) Wound Laterality: Left Cleanser Peri-Wound Care Topical Primary Dressing Candiss Norse  Seth Bake ([id number]) Secondary Dressing Secured With Compression Wrap Compression Stockings Add-Ons Electronic Signature(s) Signed: 08/22/2022 4:14:55 PM By: Yevonne Pax RN Entered By: Yevonne Pax on 08/22/2022 13:43:04 [name] ([id number]) -------------------------------------------------------------------------------- Vitals Details Patient Name: [name] Date of Service: 08/22/2022 1:15 PM Medical Record Number: [id number] Patient Account Number: [id number] Date of Birth/Sex: 1930/10/14 (88  y.o. F) Treating RN: Yevonne Pax Primary Care Daymon Hora: Aram Beecham Other Clinician: Referring Laine Fonner: Aram Beecham Treating Janneth Krasner/Extender: Rowan Blase in Treatment: 26 Vital Signs Time Taken: 13:31 Temperature (F): 98.1 Height (in): 60 Pulse (bpm): 69 Weight (lbs): 132 Respiratory Rate (breaths/min): 18 Body Mass Index (BMI): 25.8 Blood Pressure (mmHg): 173/80 Reference Range: 80 - 120 mg / dl Electronic Signature(s) Signed: 08/22/2022 4:14:55 PM By: Yevonne Pax RN Entered By: Yevonne Pax on 08/22/2022 13:32:03

## 2022-08-25 ENCOUNTER — Other Ambulatory Visit: Payer: Self-pay

## 2022-08-25 ENCOUNTER — Inpatient Hospital Stay
Admission: EM | Admit: 2022-08-25 | Discharge: 2022-08-27 | DRG: 871 | Disposition: A | Discharge status: Home Health | Payer: Medicare Other | Attending: Obstetrics and Gynecology | Admitting: Obstetrics and Gynecology

## 2022-08-25 ENCOUNTER — Emergency Department: Payer: Medicare Other

## 2022-08-25 DIAGNOSIS — Z888 Allergy status to other drugs, medicaments and biological substances status: Secondary | ICD-10-CM

## 2022-08-25 DIAGNOSIS — F411 Generalized anxiety disorder: Secondary | ICD-10-CM | POA: Diagnosis present

## 2022-08-25 DIAGNOSIS — R4182 Altered mental status, unspecified: Principal | ICD-10-CM | POA: Diagnosis present

## 2022-08-25 DIAGNOSIS — M069 Rheumatoid arthritis, unspecified: Secondary | ICD-10-CM | POA: Diagnosis present

## 2022-08-25 DIAGNOSIS — Z885 Allergy status to narcotic agent status: Secondary | ICD-10-CM | POA: Diagnosis not present

## 2022-08-25 DIAGNOSIS — F0390 Unspecified dementia without behavioral disturbance: Secondary | ICD-10-CM | POA: Diagnosis present

## 2022-08-25 DIAGNOSIS — E538 Deficiency of other specified B group vitamins: Secondary | ICD-10-CM | POA: Diagnosis present

## 2022-08-25 DIAGNOSIS — I10 Essential (primary) hypertension: Secondary | ICD-10-CM | POA: Diagnosis present

## 2022-08-25 DIAGNOSIS — Z981 Arthrodesis status: Secondary | ICD-10-CM

## 2022-08-25 DIAGNOSIS — D649 Anemia, unspecified: Secondary | ICD-10-CM | POA: Diagnosis present

## 2022-08-25 DIAGNOSIS — Z8616 Personal history of COVID-19: Secondary | ICD-10-CM

## 2022-08-25 DIAGNOSIS — N390 Urinary tract infection, site not specified: Secondary | ICD-10-CM | POA: Diagnosis present

## 2022-08-25 DIAGNOSIS — Z950 Presence of cardiac pacemaker: Secondary | ICD-10-CM

## 2022-08-25 DIAGNOSIS — Z90710 Acquired absence of both cervix and uterus: Secondary | ICD-10-CM

## 2022-08-25 DIAGNOSIS — Z96652 Presence of left artificial knee joint: Secondary | ICD-10-CM | POA: Diagnosis present

## 2022-08-25 DIAGNOSIS — G629 Polyneuropathy, unspecified: Secondary | ICD-10-CM | POA: Diagnosis present

## 2022-08-25 DIAGNOSIS — Z91048 Other nonmedicinal substance allergy status: Secondary | ICD-10-CM

## 2022-08-25 DIAGNOSIS — R6520 Severe sepsis without septic shock: Secondary | ICD-10-CM | POA: Diagnosis present

## 2022-08-25 DIAGNOSIS — Z20822 Contact with and (suspected) exposure to covid-19: Secondary | ICD-10-CM | POA: Diagnosis present

## 2022-08-25 DIAGNOSIS — R410 Disorientation, unspecified: Secondary | ICD-10-CM | POA: Diagnosis not present

## 2022-08-25 DIAGNOSIS — G9341 Metabolic encephalopathy: Secondary | ICD-10-CM | POA: Diagnosis present

## 2022-08-25 DIAGNOSIS — Z880 Allergy status to penicillin: Secondary | ICD-10-CM

## 2022-08-25 DIAGNOSIS — K219 Gastro-esophageal reflux disease without esophagitis: Secondary | ICD-10-CM | POA: Diagnosis present

## 2022-08-25 DIAGNOSIS — I739 Peripheral vascular disease, unspecified: Secondary | ICD-10-CM | POA: Diagnosis present

## 2022-08-25 DIAGNOSIS — J449 Chronic obstructive pulmonary disease, unspecified: Secondary | ICD-10-CM | POA: Diagnosis present

## 2022-08-25 DIAGNOSIS — N179 Acute kidney failure, unspecified: Secondary | ICD-10-CM | POA: Diagnosis present

## 2022-08-25 DIAGNOSIS — D539 Nutritional anemia, unspecified: Secondary | ICD-10-CM | POA: Diagnosis present

## 2022-08-25 DIAGNOSIS — Z886 Allergy status to analgesic agent status: Secondary | ICD-10-CM

## 2022-08-25 DIAGNOSIS — R531 Weakness: Secondary | ICD-10-CM

## 2022-08-25 DIAGNOSIS — E860 Dehydration: Secondary | ICD-10-CM | POA: Diagnosis present

## 2022-08-25 DIAGNOSIS — A419 Sepsis, unspecified organism: Secondary | ICD-10-CM | POA: Diagnosis present

## 2022-08-25 DIAGNOSIS — Z881 Allergy status to other antibiotic agents status: Secondary | ICD-10-CM

## 2022-08-25 DIAGNOSIS — N3000 Acute cystitis without hematuria: Secondary | ICD-10-CM | POA: Diagnosis present

## 2022-08-25 DIAGNOSIS — G3184 Mild cognitive impairment, so stated: Secondary | ICD-10-CM | POA: Diagnosis present

## 2022-08-25 LAB — COMPREHENSIVE METABOLIC PANEL
ALT: 26 U/L (ref 0–44)
AST: 41 U/L (ref 15–41)
Albumin: 4.1 g/dL (ref 3.5–5.0)
Alkaline Phosphatase: 88 U/L (ref 38–126)
Anion gap: 9 (ref 5–15)
BUN: 19 mg/dL (ref 8–23)
CO2: 27 mmol/L (ref 22–32)
Calcium: 9.4 mg/dL (ref 8.9–10.3)
Chloride: 102 mmol/L (ref 98–111)
Creatinine, Ser: 1.15 mg/dL — ABNORMAL HIGH (ref 0.44–1.00)
GFR, Estimated: 46 mL/min — ABNORMAL LOW (ref >60)
Glucose, Bld: 125 mg/dL — ABNORMAL HIGH (ref 70–99)
Potassium: 4.7 mmol/L (ref 3.5–5.1)
Sodium: 138 mmol/L (ref 135–145)
Total Bilirubin: 1.1 mg/dL (ref 0.3–1.2)
Total Protein: 7.4 g/dL (ref 6.5–8.1)

## 2022-08-25 LAB — URINALYSIS, ROUTINE W REFLEX MICROSCOPIC
Bilirubin Urine: NEGATIVE
Glucose, UA: NEGATIVE mg/dL
Ketones, ur: NEGATIVE mg/dL
Nitrite: NEGATIVE
Protein, ur: 100 mg/dL — AB
Specific Gravity, Urine: 1.021 (ref 1.005–1.030)
WBC, UA: >50 WBC/hpf — ABNORMAL HIGH (ref 0–5)
pH: 5 (ref 5.0–8.0)

## 2022-08-25 LAB — CBC
HCT: 36.7 % (ref 36.0–46.0)
Hemoglobin: 12.2 g/dL (ref 12.0–15.0)
MCH: 34.1 pg — ABNORMAL HIGH (ref 26.0–34.0)
MCHC: 33.2 g/dL (ref 30.0–36.0)
MCV: 102.5 fL — ABNORMAL HIGH (ref 80.0–100.0)
Platelets: 234 10*3/uL (ref 150–400)
RBC: 3.58 MIL/uL — ABNORMAL LOW (ref 3.87–5.11)
RDW: 14.4 % (ref 11.5–15.5)
WBC: 20.8 10*3/uL — ABNORMAL HIGH (ref 4.0–10.5)
nRBC: 0 % (ref 0.0–0.2)

## 2022-08-25 LAB — TSH: TSH: 3.319 u[IU]/mL (ref 0.350–4.500)

## 2022-08-25 LAB — T4, FREE: Free T4: 0.88 ng/dL (ref 0.61–1.12)

## 2022-08-25 LAB — TROPONIN I (HIGH SENSITIVITY)
Troponin I (High Sensitivity): 8 ng/L (ref <18)
Troponin I (High Sensitivity): 10 ng/L (ref <18)

## 2022-08-25 LAB — LACTIC ACID, PLASMA
Lactic Acid, Venous: 1.7 mmol/L (ref 0.5–1.9)
Lactic Acid, Venous: 2.3 mmol/L (ref 0.5–1.9)

## 2022-08-25 MED ORDER — SODIUM CHLORIDE 0.9 % IV SOLN
1.0000 g | INTRAVENOUS | Status: DC
  Administered 2022-08-26 – 2022-08-27 (×2): 1 g via INTRAVENOUS
  Filled 2022-08-25: qty 10
  Filled 2022-08-25: qty 1

## 2022-08-25 MED ORDER — HEPARIN SODIUM (PORCINE) 5000 UNIT/ML IJ SOLN
5000.0000 [IU] | Freq: Two times a day (BID) | INTRAMUSCULAR | Status: DC
  Administered 2022-08-25 – 2022-08-26 (×2): 5000 [IU] via SUBCUTANEOUS
  Filled 2022-08-25 (×2): qty 1

## 2022-08-25 MED ORDER — ORAL CARE MOUTH RINSE: 15.0000 mL | OROMUCOSAL | Status: DC | PRN

## 2022-08-25 MED ORDER — HYDROXYCHLOROQUINE SULFATE 200 MG PO TABS
200.0000 mg | ORAL_TABLET | Freq: Every day | ORAL | Status: DC
  Filled 2022-08-25: qty 1

## 2022-08-25 MED ORDER — LEVOFLOXACIN IN D5W 500 MG/100ML IV SOLN
500.0000 mg | Freq: Once | INTRAVENOUS | Status: AC
  Administered 2022-08-25: 500 mg via INTRAVENOUS
  Filled 2022-08-25: qty 100

## 2022-08-25 MED ORDER — LACTATED RINGERS IV SOLN: INTRAVENOUS | Status: AC

## 2022-08-25 MED ORDER — ACETAMINOPHEN 325 MG PO TABS
650.0000 mg | ORAL_TABLET | Freq: Four times a day (QID) | ORAL | Status: DC | PRN
  Administered 2022-08-25 – 2022-08-26 (×2): 650 mg via ORAL
  Filled 2022-08-25 (×2): qty 2

## 2022-08-25 NOTE — ED Triage Notes (Signed)
Pt here with her caregivers who states she was diagnosed with covid last month but now she is confused- pt was placed on an antibiotic for a UTI, but was never tested for one- pt has also had a cough- pt has been weaker than normal as well

## 2022-08-25 NOTE — ED Provider Notes (Signed)
[facility] Provider Note    Event Date/Time   First MD Initiated Contact with Patient 08/25/22 1707     (approximate)   History   Altered Mental Status   HPI  RACHELLA BASDEN is a 88 y.o. female with history of hypertension, GERD, PVD, anemia, syncope and neuropathy presents emergency department with weakness and altered mental status.  Patient was seen at her neurologist office and they sent her here for CT and evaluation.  She has 2 caregivers with her.  Patient had COVID 2 weeks ago and is still coughing.  They state that she is on a antibiotic for a UTI but was never tested for UTI.  She has had a cough and appears to be weak.  Patient is denying chest pain/shortness of breath.  She does use a walker at home.  Was worried maybe a low-grade fever per the caregivers.  No recent falls.      Physical Exam   Triage Vital Signs: ED Triage Vitals  Enc Vitals Group     BP 08/25/22 1436 97/73     Pulse Rate 08/25/22 1436 92     Resp 08/25/22 1436 18     Temp 08/25/22 1436 99.1 F (37.3 C)     Temp Source 08/25/22 1436 Oral     SpO2 08/25/22 1436 95 %     Weight 08/25/22 1440 140 lb (63.5 kg)     Height 08/25/22 1440 5' (1.524 m)     Head Circumference --      Peak Flow --      Pain Score 08/25/22 1440 5     Pain Loc --      Pain Edu? --      Excl. in GC? --     Most recent vital signs: Vitals:   08/25/22 1740 08/25/22 1850  BP: 100/75 120/77  Pulse: 88 85  Resp: 17 18  Temp: 98.1 F (36.7 C)   SpO2: 98% 96%     General: Awake, no distress.   CV:  Good peripheral perfusion. regular rate and  rhythm Resp:  Normal effort. Lungs some rhonchi noted in lower lungs bilaterally Abd:  No distention.  Nontender Other:      ED Results / Procedures / Treatments   Labs (all labs ordered are listed, but only abnormal results are displayed) Labs Reviewed  COMPREHENSIVE METABOLIC PANEL - Abnormal; Notable for the following components:       Result Value   Glucose, Bld 125 (*)    Creatinine, Ser 1.15 (*)    GFR, Estimated 46 (*)    All other components within normal limits  CBC - Abnormal; Notable for the following components:   WBC 20.8 (*)    RBC 3.58 (*)    MCV 102.5 (*)    MCH 34.1 (*)    All other components within normal limits  URINALYSIS, ROUTINE W REFLEX MICROSCOPIC - Abnormal; Notable for the following components:   Color, Urine AMBER (*)    APPearance HAZY (*)    Hgb urine dipstick SMALL (*)    Protein, ur 100 (*)    Leukocytes,Ua MODERATE (*)    WBC, UA >50 (*)    Bacteria, UA RARE (*)    Non Squamous Epithelial PRESENT (*)    All other components within normal limits  CULTURE, BLOOD (ROUTINE X 2)  CULTURE, BLOOD (ROUTINE X 2)  LACTIC ACID, PLASMA  LACTIC ACID, PLASMA  TROPONIN I (HIGH SENSITIVITY)  TROPONIN  I (HIGH SENSITIVITY)     EKG  EKG   RADIOLOGY Chest x-ray, CT of the head    PROCEDURES:   Procedures   MEDICATIONS ORDERED IN ED: Medications  levofloxacin (LEVAQUIN) IVPB 500 mg (500 mg Intravenous New Bag/Given 08/25/22 1848)     IMPRESSION / MDM / ASSESSMENT AND PLAN / ED COURSE  I reviewed the triage vital signs and the nursing notes.                              Differential diagnosis includes, but is not limited to, altered mental status, sepsis, UTI, COVID, COVID-pneumonia  Patient's presentation is most consistent with acute presentation with potential threat to life or bodily function.   Patient's O2 saturations 95% room air, she appears to be stable this time  Lab work is concerning for sepsis as her CBC is elevated WBC of 20.8.  Urinalysis also shows infection with moderate amount of leuks, greater than 50 WBCs.  Comprehensive metabolic panel and troponin are reassuring.  Chest x-ray, CT of the head ordered, lactic acid  Chest x-ray independently reviewed and interpreted by me as being negative for any acute abnormality  CT of the head independently  reviewed and interpreted by me as being negative for any acute abnormality  Lactic acid is reassuring at this time.  However due to the patient's altered mental status, weakness, elevated WBC of 20.8 and UTI we will admit the patient for altered mental status and UTI. Consult to hospitalist for admission  Started Levaquin IV.  Patient has multiple allergies including penicillin.  The side effect of Cipro for her listed is diarrhea.  No rash, difficulty breathing or hives.   Dr Allena Katz admitting  FINAL CLINICAL IMPRESSION(S) / ED DIAGNOSES   Final diagnoses:  Altered mental status, unspecified altered mental status type  Acute UTI     Rx / DC Orders   ED Discharge Orders     None        Note:  This document was prepared using Dragon voice recognition software and may include unintentional dictation errors.    Faythe Ghee, PA-C 08/25/22 Barnabas Harries, MD 08/25/22 2240

## 2022-08-25 NOTE — ED Notes (Signed)
Pt placed on pure wik pt sheets and cloths dry also brief placed

## 2022-08-26 ENCOUNTER — Encounter: Payer: Self-pay | Admitting: Internal Medicine

## 2022-08-26 ENCOUNTER — Inpatient Hospital Stay: Payer: Medicare Other

## 2022-08-26 DIAGNOSIS — R410 Disorientation, unspecified: Secondary | ICD-10-CM | POA: Diagnosis not present

## 2022-08-26 LAB — COMPREHENSIVE METABOLIC PANEL
ALT: 23 U/L (ref 0–44)
AST: 36 U/L (ref 15–41)
Albumin: 3.4 g/dL — ABNORMAL LOW (ref 3.5–5.0)
Alkaline Phosphatase: 75 U/L (ref 38–126)
Anion gap: 13 (ref 5–15)
BUN: 19 mg/dL (ref 8–23)
CO2: 24 mmol/L (ref 22–32)
Calcium: 8.7 mg/dL — ABNORMAL LOW (ref 8.9–10.3)
Chloride: 100 mmol/L (ref 98–111)
Creatinine, Ser: 1.12 mg/dL — ABNORMAL HIGH (ref 0.44–1.00)
GFR, Estimated: 47 mL/min — ABNORMAL LOW (ref >60)
Glucose, Bld: 108 mg/dL — ABNORMAL HIGH (ref 70–99)
Potassium: 4.3 mmol/L (ref 3.5–5.1)
Sodium: 137 mmol/L (ref 135–145)
Total Bilirubin: 1.1 mg/dL (ref 0.3–1.2)
Total Protein: 6.5 g/dL (ref 6.5–8.1)

## 2022-08-26 LAB — CBC
HCT: 31 % — ABNORMAL LOW (ref 36.0–46.0)
Hemoglobin: 10.4 g/dL — ABNORMAL LOW (ref 12.0–15.0)
MCH: 34.2 pg — ABNORMAL HIGH (ref 26.0–34.0)
MCHC: 33.5 g/dL (ref 30.0–36.0)
MCV: 102 fL — ABNORMAL HIGH (ref 80.0–100.0)
Platelets: 189 10*3/uL (ref 150–400)
RBC: 3.04 MIL/uL — ABNORMAL LOW (ref 3.87–5.11)
RDW: 14.1 % (ref 11.5–15.5)
WBC: 18.6 10*3/uL — ABNORMAL HIGH (ref 4.0–10.5)
nRBC: 0 % (ref 0.0–0.2)

## 2022-08-26 LAB — BASIC METABOLIC PANEL
Anion gap: 8 (ref 5–15)
BUN: 16 mg/dL (ref 8–23)
CO2: 25 mmol/L (ref 22–32)
Calcium: 8.3 mg/dL — ABNORMAL LOW (ref 8.9–10.3)
Chloride: 99 mmol/L (ref 98–111)
Creatinine, Ser: 1.16 mg/dL — ABNORMAL HIGH (ref 0.44–1.00)
GFR, Estimated: 45 mL/min — ABNORMAL LOW (ref >60)
Glucose, Bld: 131 mg/dL — ABNORMAL HIGH (ref 70–99)
Potassium: 3.7 mmol/L (ref 3.5–5.1)
Sodium: 132 mmol/L — ABNORMAL LOW (ref 135–145)

## 2022-08-26 LAB — PROTIME-INR
INR: 1.2 (ref 0.8–1.2)
Prothrombin Time: 15 seconds (ref 11.4–15.2)

## 2022-08-26 LAB — LACTIC ACID, PLASMA
Lactic Acid, Venous: 3.3 mmol/L (ref 0.5–1.9)
Lactic Acid, Venous: 1.6 mmol/L (ref 0.5–1.9)

## 2022-08-26 LAB — MRSA NEXT GEN BY PCR, NASAL: MRSA by PCR Next Gen: NOT DETECTED

## 2022-08-26 LAB — D-DIMER, QUANTITATIVE: D-Dimer, Quant: 1.21 ug/mL-FEU — ABNORMAL HIGH (ref 0.00–0.50)

## 2022-08-26 LAB — SARS CORONAVIRUS 2 BY RT PCR: SARS Coronavirus 2 by RT PCR: POSITIVE — AB

## 2022-08-26 LAB — CORTISOL-AM, BLOOD: Cortisol - AM: 22 ug/dL (ref 6.7–22.6)

## 2022-08-26 LAB — PROCALCITONIN: Procalcitonin: 0.94 ng/mL

## 2022-08-26 MED ORDER — ALPRAZOLAM 0.25 MG PO TABS: 0.2500 mg | ORAL_TABLET | Freq: Two times a day (BID) | ORAL | Status: DC | PRN

## 2022-08-26 MED ORDER — IOHEXOL 350 MG/ML SOLN
75.0000 mL | Freq: Once | INTRAVENOUS | Status: AC | PRN
  Administered 2022-08-26: 75 mL via INTRAVENOUS

## 2022-08-26 MED ORDER — ENOXAPARIN SODIUM 30 MG/0.3ML IJ SOSY
30.0000 mg | PREFILLED_SYRINGE | INTRAMUSCULAR | Status: DC
  Administered 2022-08-27: 30 mg via SUBCUTANEOUS
  Filled 2022-08-26: qty 0.3

## 2022-08-26 MED ORDER — HEPARIN SODIUM (PORCINE) 5000 UNIT/ML IJ SOLN
5000.0000 [IU] | Freq: Two times a day (BID) | INTRAMUSCULAR | Status: AC
  Administered 2022-08-26: 5000 [IU] via SUBCUTANEOUS
  Filled 2022-08-26: qty 1

## 2022-08-26 MED ORDER — IPRATROPIUM-ALBUTEROL 0.5-2.5 (3) MG/3ML IN SOLN
3.0000 mL | RESPIRATORY_TRACT | Status: DC | PRN
  Administered 2022-08-26 (×2): 3 mL via RESPIRATORY_TRACT
  Filled 2022-08-26 (×2): qty 3

## 2022-08-26 NOTE — Assessment & Plan Note (Addendum)
Not sure question of history on EKG. EKG today shows:

## 2022-08-26 NOTE — Assessment & Plan Note (Signed)
Combination of electrolyte abnormalities, infection. Physical therapy evaluation prior to discharge for safety and gait stability.  Strengthening recommendations.

## 2022-08-26 NOTE — Assessment & Plan Note (Signed)
Lab Results  Component Value Date   CREATININE 1.15 (H) 08/25/2022   CREATININE 0.73 07/26/2021   CREATININE 0.69 07/24/2021  Attribute to dehydration.  Also could be due to medication.  Avoid contrast study  and renally dose all needed meds.

## 2022-08-26 NOTE — Assessment & Plan Note (Signed)
Patient has history of mild cognitive impairment which I suspect is secondary to underlying dementia. Neurology referral for neurocognitive evaluation for formal dementia eval of upon outpatient basis.  Per primary care.

## 2022-08-26 NOTE — Assessment & Plan Note (Signed)
Continue PPI therapy and Zofran as needed.

## 2022-08-26 NOTE — Assessment & Plan Note (Signed)
    Latest Ref Rng & Units 08/25/2022    3:00 PM 07/26/2021    4:48 AM 07/24/2021    5:14 AM  CBC  WBC 4.0 - 10.5 K/uL 20.8  7.3  9.6   Hemoglobin 12.0 - 15.0 g/dL 10.2  58.5  27.7   Hematocrit 36.0 - 46.0 % 36.7  35.0  32.7   Platelets 150 - 400 K/uL 234  253  223   Resolved. We will follow.

## 2022-08-26 NOTE — Assessment & Plan Note (Signed)
Patient meets severe sepsis criteria with UTI as a source. We will continue patient on IV antibiotic therapy.

## 2022-08-26 NOTE — Assessment & Plan Note (Signed)
Attributed to underlying dementia and mild worsening secondary to possibly UTI. Acute kidney injury. Dehydration.

## 2022-08-26 NOTE — Assessment & Plan Note (Signed)
Vitals:   08/25/22 1436 08/25/22 1740 08/25/22 1850 08/25/22 1945  BP: 97/73 100/75 120/77 (!) 114/53   08/25/22 2000 08/25/22 2203 08/25/22 2351  BP: (!) 120/51 109/64 (!) 109/49  Home regimen of propranolol and amlodipine currently held secondary to hypotension.

## 2022-08-26 NOTE — Assessment & Plan Note (Signed)
Patient started on Rocephin which we will continue and follow cultures.

## 2022-08-26 NOTE — Assessment & Plan Note (Addendum)
SpO2: 94 % Stable currently. As needed albuterol.

## 2022-08-26 NOTE — Assessment & Plan Note (Signed)
Patient is severe debilitating RA affecting her MCPs more on the left hand. Continue home regimen once med rec is available for Plaquenil question methotrexate and Xeljanz.

## 2022-08-26 NOTE — Progress Notes (Addendum)
PROGRESS NOTE    SALEEMAH Barnett  ZLD:[id number] DOB: [birth year] DOA: 08/25/2022 PCP: Marguarite Arbour, MD  Outpatient Specialists: rheum, neuro, cardiology    Brief Narrative:   From admission h and p [name] is an 88 y.o. female seen today brought  for confusion. Caretaker at bedside states pt lives alone at home and does not have 24 hour care , and is alone at night.  Patient had COVID a few weeks ago and is still coughing.  At baseline patient is reported to ambulate with a walker. Today per caretaker she was found slumped but weak not syncopal. Pt was given abx per pcp for suspected UTI.   Assessment & Plan:   Principal Problem:   AMS (altered mental status) Active Problems:   Generalized weakness   Severe sepsis (HCC)   UTI (urinary tract infection)   AKI (acute kidney injury) (HCC)   Anemia   COPD (chronic obstructive pulmonary disease) (HCC)   Essential (primary) hypertension   Gastro-esophageal reflux disease without esophagitis   Mild cognitive impairment of uncertain or unknown etiology   Presence of cardiac pacemaker   Rheumatoid arthritis (HCC)  # Acute encephalopathy Appears to be 2/2 uti - treat underlying problem  # Acute cystitis # Urosepsis Sepsis by fever, tachycardia, elevated lactate. Ua suggestive of infection.  - add on urine culture - continue ceftriaxone - abdominal imaging if fails to improve over next 24 hours - continue fluids and trend lactate - f/u blood culture  # Cough # Recent covid infection CXR clear, not hypoxic - f/u dimer, would check CTA if elevated - f/u covid  # AKI Likely prerenal from infection, reduced po from encephalopathy - continue fluids and trend  # COPD Normal O2 but some cough and dyspnea - consider steroids  # GAD - home xanax prn  # HTN Here BPs soft in setting of likely infection - hold home amlodipine  # RA - hold home MTX given concern for infection   Not sure question of history on  EKG.  # Pacemaker status EKG shows paced rhythm       DVT prophylaxis: lovenox Code Status: full Family Communication: son updated telephonically 9/15  Level of care: Telemetry Medical Status is: Inpatient Remains inpatient appropriate because: severity of illness    Consultants:  none  Procedures: none  Antimicrobials:  ceftriaxone    Subjective: Confused, no complaints  Objective: Vitals:   08/25/22 2203 08/25/22 2351 08/26/22 0546 08/26/22 0804  BP: 109/64 (!) 109/49 132/79 (!) 127/53  Pulse: 96 98 94 (!) 109  Resp: (!) 24 17 18 19   Temp: 99.3 F (37.4 C) 98 F (36.7 C) 98.3 F (36.8 C) 100.2 F (37.9 C)  TempSrc: Oral  Oral Oral  SpO2: 98% 94% 94% 93%  Weight: 64.7 kg     Height: 5' (1.524 m)       Intake/Output Summary (Last 24 hours) at 08/26/2022 0929 Last data filed at 08/26/2022 0500 Gross per 24 hour  Intake 343.95 ml  Output 50 ml  Net 293.95 ml   Filed Weights   08/25/22 1440 08/25/22 2203  Weight: 63.5 kg 64.7 kg    Examination:  General exam: Appears calm and comfortable, confused Respiratory system: Clear to auscultation. Respiratory effort normal. Cardiovascular system: tachycardic, RRR. No JVD, soft systolic murmur, no rubs, gallops or clicks. No pedal edema. Gastrointestinal system: Abdomen is nondistended, soft and nontender. No organomegaly or masses felt. Normal bowel sounds heard. Central nervous system:  alert, moving all 4 symmetrically Extremities: Symmetric 5 x 5 power. Skin: No rashes, lesions or ulcers Psychiatry: confused    Data Reviewed: I have personally reviewed following labs and imaging studies  CBC: Recent Labs  Lab 08/25/22 1500 08/26/22 0425  WBC 20.8* 18.6*  HGB 12.2 10.4*  HCT 36.7 31.0*  MCV 102.5* 102.0*  PLT 234 189   Basic Metabolic Panel: Recent Labs  Lab 08/25/22 1500 08/26/22 0425  NA 138 137  K 4.7 4.3  CL 102 100  CO2 27 24  GLUCOSE 125* 108*  BUN 19 19  CREATININE 1.15* 1.12*   CALCIUM 9.4 8.7*   GFR: Estimated Creatinine Clearance: 28.6 mL/min (A) (by C-G formula based on SCr of 1.12 mg/dL (H)). Liver Function Tests: Recent Labs  Lab 08/25/22 1500 08/26/22 0425  AST 41 36  ALT 26 23  ALKPHOS 88 75  BILITOT 1.1 1.1  PROT 7.4 6.5  ALBUMIN 4.1 3.4*   No results for input(s): "LIPASE", "AMYLASE" in the last 168 hours. No results for input(s): "AMMONIA" in the last 168 hours. Coagulation Profile: Recent Labs  Lab 08/26/22 0425  INR 1.2   Cardiac Enzymes: No results for input(s): "CKTOTAL", "CKMB", "CKMBINDEX", "TROPONINI" in the last 168 hours. BNP (last 3 results) No results for input(s): "PROBNP" in the last 8760 hours. HbA1C: No results for input(s): "HGBA1C" in the last 72 hours. CBG: No results for input(s): "GLUCAP" in the last 168 hours. Lipid Profile: No results for input(s): "CHOL", "HDL", "LDLCALC", "TRIG", "CHOLHDL", "LDLDIRECT" in the last 72 hours. Thyroid Function Tests: Recent Labs    08/25/22 2004  TSH 3.319  FREET4 0.88   Anemia Panel: No results for input(s): "VITAMINB12", "FOLATE", "FERRITIN", "TIBC", "IRON", "RETICCTPCT" in the last 72 hours. Urine analysis:    Component Value Date/Time   COLORURINE AMBER (A) 08/25/2022 1542   APPEARANCEUR HAZY (A) 08/25/2022 1542   LABSPEC 1.021 08/25/2022 1542   PHURINE 5.0 08/25/2022 1542   GLUCOSEU NEGATIVE 08/25/2022 1542   HGBUR SMALL (A) 08/25/2022 1542   BILIRUBINUR NEGATIVE 08/25/2022 1542   KETONESUR NEGATIVE 08/25/2022 1542   PROTEINUR 100 (A) 08/25/2022 1542   NITRITE NEGATIVE 08/25/2022 1542   LEUKOCYTESUR MODERATE (A) 08/25/2022 1542   Sepsis Labs: @LABRCNTIP (procalcitonin:4,lacticidven:4)  ) Recent Results (from the past 240 hour(s))  Culture, blood (routine x 2)     Status: None (Preliminary result)   Collection Time: 08/25/22  5:18 PM   Specimen: BLOOD RIGHT FOREARM  Result Value Ref Range Status   Specimen Description BLOOD RIGHT FOREARM  Final   Special  Requests   Final    BOTTLES DRAWN AEROBIC AND ANAEROBIC Blood Culture adequate volume   Culture   Final    NO GROWTH < 12 HOURS Performed at [facility], [address], [place], [place]    Report Status PENDING  Incomplete  Culture, blood (routine x 2)     Status: None (Preliminary result)   Collection Time: 08/25/22  5:24 PM   Specimen: Right Antecubital; Blood  Result Value Ref Range Status   Specimen Description RIGHT ANTECUBITAL  Final   Special Requests   Final    BOTTLES DRAWN AEROBIC AND ANAEROBIC Blood Culture adequate volume   Culture   Final    NO GROWTH < 12 HOURS Performed at [facility], [address] [place], [place]    Report Status PENDING  Incomplete         Radiology Studies: DG Chest Portable  1 View  Result Date: 08/25/2022 CLINICAL DATA:  cough recent covid, weakness EXAM: PORTABLE CHEST 1 VIEW COMPARISON:  Radiograph 07/23/2021 FINDINGS: Unchanged cardiomediastinal silhouette with dual chamber pacemaker leads. There is no focal airspace consolidation. There is no large effusion. No evidence of pneumothorax. There is no acute osseous abnormality. Cervical spine fusion hardware noted. Bilateral shoulder degenerative changes. Partially visualized lumbar spine fusion hardware. IMPRESSION: No evidence of acute cardiopulmonary disease. Electronically Signed   By: Caprice Renshaw M.D.   On: 08/25/2022 17:52   CT HEAD WO CONTRAST ( )  Result Date: 08/25/2022 CLINICAL DATA:  Mental status change EXAM: CT HEAD WITHOUT CONTRAST TECHNIQUE: Contiguous axial images were obtained from the base of the skull through the vertex without intravenous contrast. RADIATION DOSE REDUCTION: This exam was performed according to the departmental dose-optimization program which includes automated exposure control, adjustment of the mA and/or kV according to patient size and/or use of iterative reconstruction technique. COMPARISON:  CT 07/23/2021  FINDINGS: Brain: No acute territorial infarction, hemorrhage, or intracranial mass. Atrophy. Moderate chronic small vessel ischemic changes of the white matter. Prominent ventricles likely related to atrophy, stable in size. Vascular: No hyperdense vessels. Vertebral and carotid vascular calcification. Skull: Normal. Negative for fracture or focal lesion. Sinuses/Orbits: Patchy mucosal thickening in the sinuses Other: None IMPRESSION: 1. No CT evidence for acute intracranial abnormality. 2. Atrophy and chronic small vessel ischemic changes of the white matter Electronically Signed   By: [name] Pang M.D.   On: 08/25/2022 17:50        Scheduled Meds:  heparin  5,000 Units Subcutaneous Q12H   Continuous Infusions:  cefTRIAXone (ROCEPHIN)  IV 1 g (08/26/22 0835)   lactated ringers 50 mL/hr at 08/26/22 0329     LOS: 1 day     Silvano Bilis, MD [facility]   If 7PM-7AM, please contact night-coverage [email or web address] Password [facility] 08/26/2022, 9:29 AM

## 2022-08-26 NOTE — Progress Notes (Signed)
   08/26/22 0804  Assess: MEWS Score  Temp 100.2 F (37.9 C)  BP (!) 127/53  MAP (mmHg) 72  Pulse Rate (!) 109  ECG Heart Rate (!) 113  Resp 19  Level of Consciousness Alert  SpO2 93 %  Assess: MEWS Score  MEWS Temp 0  MEWS Systolic 0  MEWS Pulse 2  MEWS RR 0  MEWS LOC 0  MEWS Score 2  MEWS Score Color Yellow  Assess: if the MEWS score is Yellow or Red  Were vital signs taken at a resting state? Yes  Focused Assessment No change from prior assessment  MEWS guidelines implemented *See Row Information* Yes  Treat  Pain Scale 0-10  Pain Score 10  Pain Type Acute pain  Pain Location Head  Pain Intervention(s) Medication (See eMAR)  Escalate  MEWS: Escalate Yellow: discuss with charge nurse/RN and consider discussing with provider and RRT  Notify: Charge Nurse/RN  Name of Charge Nurse/RN Notified jo RN  Date Charge Nurse/RN Notified 08/26/22  Time Charge Nurse/RN Notified 0815  Notify: Provider  Provider Name/Title WOUK  Date Provider Notified 08/26/22  Time Provider Notified 0845  Method of Notification  (secure chat)  Notification Reason  (message read [phone])  Assess: SIRS CRITERIA  SIRS Temperature  0  SIRS Pulse 1  SIRS Respirations  0  SIRS WBC 1  SIRS Score Sum  2   0845 notified MD LA 2.3 last night. Gave tylenol see new orders.  1048. Notified MD Wouk LA 3.3

## 2022-08-26 NOTE — H&P (Signed)
History and Physical    Chief Complaint: Confusion    HISTORY OF PRESENT ILLNESS: [name] is an 88 y.o. female seen today brought  for confusion. Caretaker at bedside states pt lives alone at home and does not have 24 hour care , and is alone at night.  Patient had COVID a few weeks ago and is still coughing.  At baseline patient is reported to ambulate with a walker. Today per caretaker she was found slumped but weak not syncopal. Pt was given abx per pcp for suspected UTI.   PAST MEDICAL HISTORY: Past Medical History:  Diagnosis Date   Anemia    B12 deficiency 10/09/2019   GERD (gastroesophageal reflux disease)    Hypertension    Neuropathy    Peripheral vascular disease (HCC)    possible neuropathies in lower extremeties   PONV (postoperative nausea and vomiting)    happens sometimes but better with pre med of zofran   Presence of permanent cardiac pacemaker    Syncope     Review Of Systems: Review of Systems  Unable to perform ROS: Age  Constitutional:  Positive for fatigue.  Neurological:  Positive for weakness.       Confusion     ALLERGIES: Allergies  Allergen Reactions   Codeine Nausea And Vomiting   Doxycycline Other (See Comments)    Flu-like s/s   Morphine Nausea And Vomiting and Anxiety    Hallucinations and anxiety   Tramadol Nausea And Vomiting and Other (See Comments)    Causes poor balance   Anakinra    Celecoxib    Hydroxychloroquine     Other reaction(s): Unknown   Adhesive [Tape] Rash   Amitriptyline Other (See Comments)    Caused nervousness. Patient states she has never taken this.   Ciprofloxacin Diarrhea   Clarithromycin Nausea Only    Flu like symptoms   Erythromycin Rash   Itraconazole Other (See Comments)    Elevated liver enzymes   Nsaids Nausea Only    Unknown   Penicillins Rash    Hives also. Doctor told her to never ever take pcn unless it is her only option   Zoloft [Sertraline Hcl] Other (See Comments)     Patient unaware of any reaction. Probably upset stomach    PAST SURGICAL HISTORY: Past Surgical History:  Procedure Laterality Date   [place]   with hysterectomy   BACK SURGERY  2001   rods in back. surgery on back x 3   COLONOSCOPY     I & D EXTREMITY Left 03/27/2018   Procedure: IRRIGATION AND DEBRIDEMENT LEFT ELBOW / OLECRANON BURSA;  Surgeon: Corky Mull, MD;  Location: [facility];  Service: Orthopedics;  Laterality: Left;   I & D EXTREMITY Left 06/05/2018   Procedure: IRRIGATION AND DEBRIDEMENT EXTREMITY;  Surgeon: Corky Mull, MD;  Location: [facility];  Service: Orthopedics;  Laterality: Left;   INSERT / REPLACE / REMOVE PACEMAKER  2004   JOINT REPLACEMENT Left 2004   partial knee replacement   OLECRANON BURSECTOMY Left 03/27/2018   Procedure: LEFT OLECRANON BURSA;  Surgeon: Corky Mull, MD;  Location: [facility];  Service: Orthopedics;  Laterality: Left;   PACEMAKER INSERTION  2014   dual chamber for complete heart block     SOCIAL HISTORY: Social History   Socioeconomic History   Marital status: Widowed    Spouse name: Not on file   Number of children: Not on file  Years of education: Not on file   Highest education level: Not on file  Occupational History   Not on file  Tobacco Use   Smoking status: Never   Smokeless tobacco: Never  Vaping Use   Vaping Use: Never used  Substance and Sexual Activity   Alcohol use: Yes    Alcohol/week: 7.0 standard drinks of alcohol    Types: 7 Glasses of wine per week    Comment: 1 glass of wine   Drug use: Never   Sexual activity: Not Currently  Other Topics Concern   Not on file  Social History Narrative   ** Merged History Encounter **       Social Determinants of Health   Financial Resource Strain: Not on file  Food Insecurity: No Food Insecurity (08/25/2022)   Hunger Vital Sign    Worried About Running Out of Food in the Last Year: Never true    Ran Out of Food in the  Last Year: Never true  Transportation Needs: No Transportation Needs (08/25/2022)   PRAPARE - Hydrologist (Medical): No    Lack of Transportation (Non-Medical): No  Physical Activity: Not on file  Stress: Not on file  Social Connections: Not on file      CURRENT MEDS:        Current Facility-Administered Medications (Analgesics):    acetaminophen (TYLENOL) tablet 650 mg   Current Facility-Administered Medications (Hematological):    heparin injection 5,000 Units   Current Facility-Administered Medications (Other):    cefTRIAXone (ROCEPHIN) 1 g in sodium chloride 0.9 % 100 mL IVPB   lactated ringers infusion   Oral care mouth rinse  No current outpatient medications on file.    ED Course: Pt in Ed is alert awake oriented to self and place but not year. Vitals:   08/25/22 1945 08/25/22 2000 08/25/22 2203 08/25/22 2351  BP: (!) 114/53 (!) 120/51 109/64 (!) 109/49  Pulse: 92 91 96 98  Resp:  16 (!) 24 17  Temp:   99.3 F (37.4 C) 98 F (36.7 C)  TempSrc:   Oral   SpO2: 94% 96% 98% 94%  Weight:   64.7 kg   Height:   5' (1.524 m)    >Total I/O In: 100 [IV Piggyback:100] Out: -  >SpO2: 94 % > Patient meets sepsis criteria as of infection is UTI . Results for orders placed or performed during the hospital encounter of 08/25/22 (from the past 24 hour(s))  Comprehensive metabolic panel     Status: Abnormal   Collection Time: 08/25/22  3:00 PM  Result Value Ref Range   Sodium 138 135 - 145 mmol/L   Potassium 4.7 3.5 - 5.1 mmol/L   Chloride 102 98 - 111 mmol/L   CO2 27 22 - 32 mmol/L   Glucose, Bld 125 (H) 70 - 99 mg/dL   BUN 19 8 - 23 mg/dL   Creatinine, Ser 1.15 (H) 0.44 - 1.00 mg/dL   Calcium 9.4 8.9 - 10.3 mg/dL   Total Protein 7.4 6.5 - 8.1 g/dL   Albumin 4.1 3.5 - 5.0 g/dL   AST 41 15 - 41 U/L   ALT 26 0 - 44 U/L   Alkaline Phosphatase 88 38 - 126 U/L   Total Bilirubin 1.1 0.3 - 1.2 mg/dL   GFR, Estimated 46 (L) >60 mL/min    Anion gap 9 5 - 15  CBC     Status: Abnormal   Collection Time: 08/25/22  3:00 PM  Result Value Ref Range   WBC 20.8 (H) 4.0 - 10.5 K/uL   RBC 3.58 (L) 3.87 - 5.11 MIL/uL   Hemoglobin 12.2 12.0 - 15.0 g/dL   HCT 36.7 36.0 - 46.0 %   MCV 102.5 (H) 80.0 - 100.0 fL   MCH 34.1 (H) 26.0 - 34.0 pg   MCHC 33.2 30.0 - 36.0 g/dL   RDW 14.4 11.5 - 15.5 %   Platelets 234 150 - 400 K/uL   nRBC 0.0 0.0 - 0.2 %  Troponin I (High Sensitivity)     Status: None   Collection Time: 08/25/22  3:00 PM  Result Value Ref Range   Troponin I (High Sensitivity) 8 <18 ng/L  Urinalysis, Routine w reflex microscopic Urine, Clean Catch     Status: Abnormal   Collection Time: 08/25/22  3:42 PM  Result Value Ref Range   Color, Urine AMBER (A) YELLOW   APPearance HAZY (A) CLEAR   Specific Gravity, Urine 1.021 1.005 - 1.030   pH 5.0 5.0 - 8.0   Glucose, UA NEGATIVE NEGATIVE mg/dL   Hgb urine dipstick SMALL (A) NEGATIVE   Bilirubin Urine NEGATIVE NEGATIVE   Ketones, ur NEGATIVE NEGATIVE mg/dL   Protein, ur 100 (A) NEGATIVE mg/dL   Nitrite NEGATIVE NEGATIVE   Leukocytes,Ua MODERATE (A) NEGATIVE   RBC / HPF 6-10 0 - 5 RBC/hpf   WBC, UA >50 (H) 0 - 5 WBC/hpf   Bacteria, UA RARE (A) NONE SEEN   Squamous Epithelial / LPF 0-5 0 - 5   Mucus PRESENT    Hyaline Casts, UA PRESENT    Non Squamous Epithelial PRESENT (A) NONE SEEN  Troponin I (High Sensitivity)     Status: None   Collection Time: 08/25/22  5:24 PM  Result Value Ref Range   Troponin I (High Sensitivity) 10 <18 ng/L  Lactic acid, plasma     Status: None   Collection Time: 08/25/22  5:24 PM  Result Value Ref Range   Lactic Acid, Venous 1.7 0.5 - 1.9 mmol/L  TSH     Status: None   Collection Time: 08/25/22  8:04 PM  Result Value Ref Range   TSH 3.319 0.350 - 4.500 uIU/mL  T4, free     Status: None   Collection Time: 08/25/22  8:04 PM  Result Value Ref Range   Free T4 0.88 0.61 - 1.12 ng/dL  Lactic acid, plasma     Status: Abnormal    Collection Time: 08/25/22  9:14 PM  Result Value Ref Range   Lactic Acid, Venous 2.3 (HH) 0.5 - 1.9 mmol/L   Meds ordered this encounter  Medications   levofloxacin (LEVAQUIN) IVPB 500 mg    Order Specific Question:   Antibiotic Indication:    Answer:   UTI   DISCONTD: hydroxychloroquine (PLAQUENIL) tablet 200 mg   heparin injection 5,000 Units   lactated ringers infusion   Oral care mouth rinse   acetaminophen (TYLENOL) tablet 650 mg   cefTRIAXone (ROCEPHIN) 1 g in sodium chloride 0.9 % 100 mL IVPB    Order Specific Question:   Antibiotic Indication:    Answer:   UTI    Admission Imaging : DG Chest Portable 1 View  Result Date: 08/25/2022 CLINICAL DATA:  cough recent covid, weakness EXAM: PORTABLE CHEST 1 VIEW COMPARISON:  Radiograph 07/23/2021 FINDINGS: Unchanged cardiomediastinal silhouette with dual chamber pacemaker leads. There is no focal airspace consolidation. There is no large effusion. No evidence of pneumothorax. There is  no acute osseous abnormality. Cervical spine fusion hardware noted. Bilateral shoulder degenerative changes. Partially visualized lumbar spine fusion hardware. IMPRESSION: No evidence of acute cardiopulmonary disease. Electronically Signed   By: Caprice Renshaw M.D.   On: 08/25/2022 17:52   CT HEAD WO CONTRAST ( )  Result Date: 08/25/2022 CLINICAL DATA:  Mental status change EXAM: CT HEAD WITHOUT CONTRAST TECHNIQUE: Contiguous axial images were obtained from the base of the skull through the vertex without intravenous contrast. RADIATION DOSE REDUCTION: This exam was performed according to the departmental dose-optimization program which includes automated exposure control, adjustment of the mA and/or kV according to patient size and/or use of iterative reconstruction technique. COMPARISON:  CT 07/23/2021 FINDINGS: Brain: No acute territorial infarction, hemorrhage, or intracranial mass. Atrophy. Moderate chronic small vessel ischemic changes of the white matter.  Prominent ventricles likely related to atrophy, stable in size. Vascular: No hyperdense vessels. Vertebral and carotid vascular calcification. Skull: Normal. Negative for fracture or focal lesion. Sinuses/Orbits: Patchy mucosal thickening in the sinuses Other: None IMPRESSION: 1. No CT evidence for acute intracranial abnormality. 2. Atrophy and chronic small vessel ischemic changes of the white matter Electronically Signed   By: [name] Pang M.D.   On: 08/25/2022 17:50      Physical Examination: Blood pressure (!) 109/49, pulse 98, temperature 98 F (36.7 C), resp. rate 17, height 5' (1.524 m), weight 64.7 kg, SpO2 94 %. Physical Exam Vitals and nursing note reviewed.  Constitutional:      General: She is not in acute distress.    Appearance: Normal appearance. She is not ill-appearing, toxic-appearing or diaphoretic.  HENT:     Head: Normocephalic and atraumatic.     Right Ear: Hearing and external ear normal.     Left Ear: Hearing and external ear normal.     Nose: Nose normal. No nasal deformity.     Mouth/Throat:     Lips: Pink.     Mouth: Mucous membranes are dry.     Tongue: No lesions.  Cardiovascular:     Rate and Rhythm: Normal rate and regular rhythm.     Pulses: Normal pulses.     Heart sounds: Normal heart sounds.  Pulmonary:     Effort: Pulmonary effort is normal.     Breath sounds: Normal breath sounds.  Abdominal:     General: Bowel sounds are normal. There is no distension.     Palpations: Abdomen is soft. There is no mass.     Tenderness: There is no abdominal tenderness. There is no guarding.     Hernia: No hernia is present.  Musculoskeletal:     Right lower leg: No edema.     Left lower leg: No edema.  Skin:    General: Skin is warm.  Neurological:     General: No focal deficit present.     Mental Status: She is alert and oriented to person, place, and time.     Cranial Nerves: Cranial nerves 2-12 are intact.     Motor: Motor function is intact.   Psychiatric:        Attention and Perception: Attention normal.        Mood and Affect: Mood normal.        Speech: Speech normal.        Behavior: Behavior normal. Behavior is cooperative.        Cognition and Memory: Cognition normal.      Assessment and Plan: * AMS (altered mental status) Attributed to underlying dementia and  mild worsening secondary to possibly UTI. Acute kidney injury. Dehydration.   Severe sepsis [facility]) Patient meets severe sepsis criteria with UTI as a source. We will continue patient on IV antibiotic therapy.   Generalized weakness Combination of electrolyte abnormalities, infection. Physical therapy evaluation prior to discharge for safety and gait stability.  Strengthening recommendations.  UTI (urinary tract infection) Patient started on Rocephin which we will continue and follow cultures.   AKI (acute kidney injury) [facility]) Lab Results  Component Value Date   CREATININE 1.15 (H) 08/25/2022   CREATININE 0.73 07/26/2021   CREATININE 0.69 07/24/2021  Attribute to dehydration.  Also could be due to medication.  Avoid contrast study  and renally dose all needed meds.   Anemia    Latest Ref Rng & Units 08/25/2022    3:00 PM 07/26/2021    4:48 AM 07/24/2021    5:14 AM  CBC  WBC 4.0 - 10.5 K/uL 20.8  7.3  9.6   Hemoglobin 12.0 - 15.0 g/dL 12.2  12.6  11.7   Hematocrit 36.0 - 46.0 % 36.7  35.0  32.7   Platelets 150 - 400 K/uL 234  253  223   Resolved. We will follow.    COPD (chronic obstructive pulmonary disease) (HCC) SpO2: 94 % Stable currently. As needed albuterol.  Essential (primary) hypertension Vitals:   08/25/22 1436 08/25/22 1740 08/25/22 1850 08/25/22 1945  BP: 97/73 100/75 120/77 (!) 114/53   08/25/22 2000 08/25/22 2203 08/25/22 2351  BP: (!) 120/51 109/64 (!) 109/49  Home regimen of propranolol and amlodipine currently held secondary to hypotension.   Gastro-esophageal reflux disease without esophagitis Continue PPI  therapy and Zofran as needed.  Mild cognitive impairment of uncertain or unknown etiology Patient has history of mild cognitive impairment which I suspect is secondary to underlying dementia. Neurology referral for neurocognitive evaluation for formal dementia eval of upon outpatient basis.  Per primary care.  Presence of cardiac pacemaker Not sure question of history on EKG. EKG today shows:   Rheumatoid arthritis [facility]) Patient is severe debilitating RA affecting her MCPs more on the left hand. Continue home regimen once med rec is available for Plaquenil question methotrexate and Xeljanz.         Unresulted Labs (From admission, onward)     Start     Ordered   08/26/22 0500  Protime-INR  Tomorrow morning,   STAT        08/25/22 2005   08/26/22 0500  Procalcitonin  Tomorrow morning,   URGENT        08/25/22 2005   08/26/22 0500  Cortisol-am, blood  Tomorrow morning,   URGENT        08/25/22 2005   08/26/22 0500  CBC  Tomorrow morning,   STAT        08/25/22 2005   08/26/22 0500  Comprehensive metabolic panel  Tomorrow morning,   STAT        08/25/22 2005   08/25/22 1718  Culture, blood (routine x 2)  BLOOD CULTURE X 2,   STAT      08/25/22 1718             DVT prophylaxis:  Heparin  Code Status:  Full code  Family Communication:  Ailean Kolodziejczyk son: [phone]  Disposition Plan:  To be determined  Consults called:  None  Admission status: Inpatient  Unit/ Expected LOS: Med telemetry/2 to 3 days.   Para Skeans MD [facility]  6 PM- 2 AM.  Please contact me via secure Chat 6 PM-2 AM. [phone] ( Pager ) To contact the [facility] Attending or Consulting provider 7A - 7P or covering provider during after hours 7P -7A, for this patient.   Check the care team in [facility] and look for a) attending/consulting [facility] provider listed and b) the [facility] team listed Log into [email or web address] and use [facility] universal password to access. If you do not have the  password, please contact the hospital operator. Locate the [facility] provider you are looking for under [facility] and page to a number that you can be directly reached. If you still have difficulty reaching the provider, please page the [facility] (Director on Call) for the Hospitalists listed on amion for assistance. [email or web address] 08/26/2022, 1:54 AM

## 2022-08-27 DIAGNOSIS — R410 Disorientation, unspecified: Secondary | ICD-10-CM | POA: Diagnosis not present

## 2022-08-27 LAB — LACTIC ACID, PLASMA: Lactic Acid, Venous: 1.4 mmol/L (ref 0.5–1.9)

## 2022-08-27 LAB — URINE CULTURE: Culture: 20000 — AB

## 2022-08-27 LAB — CBC
HCT: 28.9 % — ABNORMAL LOW (ref 36.0–46.0)
Hemoglobin: 9.5 g/dL — ABNORMAL LOW (ref 12.0–15.0)
MCH: 33.3 pg (ref 26.0–34.0)
MCHC: 32.9 g/dL (ref 30.0–36.0)
MCV: 101.4 fL — ABNORMAL HIGH (ref 80.0–100.0)
Platelets: 169 10*3/uL (ref 150–400)
RBC: 2.85 MIL/uL — ABNORMAL LOW (ref 3.87–5.11)
RDW: 14.3 % (ref 11.5–15.5)
WBC: 9 10*3/uL (ref 4.0–10.5)
nRBC: 0 % (ref 0.0–0.2)

## 2022-08-27 LAB — BASIC METABOLIC PANEL
Anion gap: 8 (ref 5–15)
BUN: 12 mg/dL (ref 8–23)
CO2: 25 mmol/L (ref 22–32)
Calcium: 8.7 mg/dL — ABNORMAL LOW (ref 8.9–10.3)
Chloride: 102 mmol/L (ref 98–111)
Creatinine, Ser: 0.91 mg/dL (ref 0.44–1.00)
GFR, Estimated: >60 mL/min (ref >60)
Glucose, Bld: 100 mg/dL — ABNORMAL HIGH (ref 70–99)
Potassium: 3.9 mmol/L (ref 3.5–5.1)
Sodium: 135 mmol/L (ref 135–145)

## 2022-08-27 MED ORDER — ENOXAPARIN SODIUM 40 MG/0.4ML IJ SOSY: 40.0000 mg | PREFILLED_SYRINGE | INTRAMUSCULAR | Status: DC

## 2022-08-27 MED ORDER — CEFADROXIL 500 MG PO CAPS: 500.0000 mg | ORAL_CAPSULE | Freq: Two times a day (BID) | ORAL | 0 refills | Status: DC

## 2022-08-27 NOTE — Discharge Summary (Signed)
[name]:[id number] DOB: 07/05/1930 DOA: 08/25/2022  PCP: Natasha Arbour, MD  Admit date: 08/25/2022 Discharge date: 08/27/2022  Time spent: 35 minutes  Recommendations for Outpatient Follow-up:  Pcp f/u     Discharge Diagnoses:  Principal Problem:   AMS (altered mental status) Active Problems:   Generalized weakness   Severe sepsis (HCC)   UTI (urinary tract infection)   AKI (acute kidney injury) (HCC)   Anemia   COPD (chronic obstructive pulmonary disease) (HCC)   Essential (primary) hypertension   Gastro-esophageal reflux disease without esophagitis   Mild cognitive impairment of uncertain or unknown etiology   Presence of cardiac pacemaker   Rheumatoid arthritis [facility])   Discharge Condition: stable  Diet recommendation: heart healthy  Filed Weights   08/25/22 1440 08/25/22 2203  Weight: 63.5 kg 64.7 kg    History of present illness:  From admission h and p From admission h and p [name] is an 88 y.o. female seen today brought  for confusion. Caretaker at bedside states pt lives alone at home and does not have 24 hour care , and is alone at night.  Patient had COVID a few weeks ago and is still coughing.  At baseline patient is reported to ambulate with a walker. Today per caretaker she was found slumped but weak not syncopal. Pt was given abx per pcp for suspected UTI.  Hospital Course:  Patient presented encephalopathic with fever, tachycardia, elevated lactate, and mild aki. This in setting of recent covid (diagnosed 8/17) which she had recovered from. Urinalysis suggestive of infection so was treated for presumed uti though culture grew multiple species. Also with mild cough - cta with no pe but does show possible atypical infection vs pneumonia. Patient treated for both with cephalosporin and fluids. Lactate normalized and aki resolved and patient tolerating by mouth. Encephalopathy much improved. PT evaluated, will order home health. Respiratory  status improved and white count normalized. Given response to cephalosporin will continue oral cephalosporin (duricef) at discharge which would cover cap and uti. Advising pcp f/u 1 week. Hold methotrexate until infection resolved.  Procedures: none   Consultations: none  Discharge Exam: Vitals:   08/27/22 0906 08/27/22 1208  BP:  (!) 148/60  Pulse:  80  Resp:  16  Temp:  98.2 F (36.8 C)  SpO2: 94% 97%    General: NAD Cardiovascular: RRR Respiratory: CTAB  Discharge Instructions   Discharge Instructions     Diet - low sodium heart healthy   Complete by: As directed    Increase activity slowly   Complete by: As directed       Allergies as of 08/27/2022       Reactions   Codeine Nausea And Vomiting   Doxycycline Other (See Comments)   Flu-like s/s   Morphine Nausea And Vomiting, Anxiety   Hallucinations and anxiety   Tramadol Nausea And Vomiting, Other (See Comments)   Causes poor balance   Anakinra    Celecoxib    Hydroxychloroquine    Other reaction(s): Unknown   Adhesive [tape] Rash   Amitriptyline Other (See Comments)   Caused nervousness. Patient states she has never taken this.   Ciprofloxacin Diarrhea   Clarithromycin Nausea Only   Flu like symptoms   Erythromycin Rash   Itraconazole Other (See Comments)   Elevated liver enzymes   Nsaids Nausea Only   Unknown   Penicillins Rash   Hives also. Doctor told her to never ever take pcn unless it is  her only option   Zoloft [sertraline Hcl] Other (See Comments)   Patient unaware of any reaction. Probably upset stomach        Medication List     STOP taking these medications    gabapentin 100 MG capsule Commonly known as: NEURONTIN   methotrexate 2.5 MG tablet   nitrofurantoin (macrocrystal-monohydrate) 100 MG capsule Commonly known as: MACROBID       TAKE these medications    acetaminophen 500 MG tablet Commonly known as: TYLENOL Take 250 mg by mouth 2 (two) times daily.    albuterol 108 (90 Base) MCG/ACT inhaler Commonly known as: VENTOLIN HFA Inhale 2 puffs into the lungs every 6 (six) hours as needed.   ALPRAZolam 0.25 MG tablet Commonly known as: XANAX Take 1 tablet (0.25 mg total) by mouth 2 (two) times daily as needed for anxiety.   amLODipine 5 MG tablet Commonly known as: NORVASC Take 1 tablet (5 mg total) by mouth daily.   Calcium-D 600-400 MG-UNIT Tabs Take 1 tablet by mouth 2 (two) times daily.   CALMME PO Take 5 mLs by mouth at bedtime. Magnesium 1 tsp at night (dissolved in 4 oz. Water)   cefadroxil 500 MG capsule Commonly known as: DURICEF Take 1 capsule (500 mg total) by mouth 2 (two) times daily. Start taking on: August 28, 2022   cyanocobalamin 1000 MCG/ML injection Commonly known as: VITAMIN B12 Inject 1,000 mcg into the muscle every 30 (thirty) days.   estradiol 0.1 MG/GM vaginal cream Commonly known as: ESTRACE Place 1 Applicatorful vaginally at bedtime as needed (for vaginal discomfort).   FIBER-CAPS PO Take 1 capsule by mouth at bedtime.   folic acid 1 MG tablet Commonly known as: FOLVITE Take 1 mg by mouth daily.   multivitamin tablet Take 1 tablet by mouth daily.   omeprazole 40 MG capsule Commonly known as: PRILOSEC Take 40 mg by mouth daily before breakfast.   ondansetron 4 MG tablet Commonly known as: ZOFRAN Take 4 mg by mouth every 8 (eight) hours as needed for nausea or vomiting.   PHILLIPS COLON HEALTH PO Take 1 tablet by mouth daily.   propranolol ER 60 MG 24 hr capsule Commonly known as: INDERAL LA Take 60 mg by mouth daily.   vitamin C 1000 MG tablet Take 1,000 mg by mouth daily.   Vitamin D-3 125 MCG (5000 UT) Tabs Take 5,000 Units by mouth daily.       Allergies  Allergen Reactions   Codeine Nausea And Vomiting   Doxycycline Other (See Comments)    Flu-like s/s   Morphine Nausea And Vomiting and Anxiety    Hallucinations and anxiety   Tramadol Nausea And Vomiting and Other  (See Comments)    Causes poor balance   Anakinra    Celecoxib    Hydroxychloroquine     Other reaction(s): Unknown   Adhesive [Tape] Rash   Amitriptyline Other (See Comments)    Caused nervousness. Patient states she has never taken this.   Ciprofloxacin Diarrhea   Clarithromycin Nausea Only    Flu like symptoms   Erythromycin Rash   Itraconazole Other (See Comments)    Elevated liver enzymes   Nsaids Nausea Only    Unknown   Penicillins Rash    Hives also. Doctor told her to never ever take pcn unless it is her only option   Zoloft [Sertraline Hcl] Other (See Comments)    Patient unaware of any reaction. Probably upset stomach    Follow-up Information  Natasha Arbour, MD Follow up.   Specialty: Internal Medicine Contact information: [address] [place] [address] [phone]                  The results of significant diagnostics from this hospitalization (including imaging, microbiology, ancillary and laboratory) are listed below for reference.    Significant Diagnostic Studies: CT Angio Chest Pulmonary Embolism (PE) W or WO Contrast  Result Date: 08/26/2022 CLINICAL DATA:  dyspnea, recent covid EXAM: CT ANGIOGRAPHY CHEST WITH CONTRAST TECHNIQUE: Multidetector CT imaging of the chest was performed using the standard protocol during bolus administration of intravenous contrast. Multiplanar CT image reconstructions and MIPs were obtained to evaluate the vascular anatomy. RADIATION DOSE REDUCTION: This exam was performed according to the departmental dose-optimization program which includes automated exposure control, adjustment of the mA and/or kV according to patient size and/or use of iterative reconstruction technique. CONTRAST:  75mL OMNIPAQUE IOHEXOL 350 MG/ML SOLN COMPARISON:  CT abdomen pelvis dated November 27, 2015. CT cervical spine dated January 27, 2017. FINDINGS: Cardiovascular: Satisfactory opacification of the pulmonary arteries to the  segmental level. No evidence of pulmonary embolism. Mild cardiomegaly. Trace pericardial fluid. LEFT chest cardiac pacing device. Atherosclerotic calcifications of the nonaneurysmal thoracic aorta. Aortic valve calcifications. Atherosclerotic calcifications of the coronary arteries. Mediastinum/Nodes: No axillary or mediastinal adenopathy. Mildly prominent preaortic lymph node measures 8 mm in the short axis (series 5, image 108). Visualized thyroid is unremarkable. Lungs/Pleura: Scattered areas of endobronchial debris in a basilar predominant distribution. Bibasilar ground-glass and reticulonodular opacities. Minimal centrilobular emphysema. Biapical scarring, similar in comparison to priors. Upper Abdomen: There is partial visualization of a 2.6 cm peripheral hypodense masslike area of the liver (series 5, image 291). Musculoskeletal: Postsurgical changes of the cervical and lumbar spine. Dextrocurvature of the thoracic spine. Review of the MIP images confirms the above findings. IMPRESSION: 1. No acute pulmonary embolism. 2. Patchy reticulonodular opacities with scattered areas of endobronchial debris of bilateral lower lobes. Findings may reflect atypical infection or recurrent aspiration with scattered areas of atelectasis. 3. At the very edge of the imaged liver, there is a possible 2.6 cm mass of the peripheral RIGHT liver versus summation artifact. Consider furtherd dedicated evaluation with CT abdomen pelvis with contrast for improved visualization. Aortic Atherosclerosis (ICD10-I70.0). Electronically Signed   By: Meda Klinefelter M.D.   On: 08/26/2022 12:15   DG Chest Portable 1 View  Result Date: 08/25/2022 CLINICAL DATA:  cough recent covid, weakness EXAM: PORTABLE CHEST 1 VIEW COMPARISON:  Radiograph 07/23/2021 FINDINGS: Unchanged cardiomediastinal silhouette with dual chamber pacemaker leads. There is no focal airspace consolidation. There is no large effusion. No evidence of pneumothorax. There  is no acute osseous abnormality. Cervical spine fusion hardware noted. Bilateral shoulder degenerative changes. Partially visualized lumbar spine fusion hardware. IMPRESSION: No evidence of acute cardiopulmonary disease. Electronically Signed   By: Caprice Renshaw M.D.   On: 08/25/2022 17:52   CT HEAD WO CONTRAST ( )  Result Date: 08/25/2022 CLINICAL DATA:  Mental status change EXAM: CT HEAD WITHOUT CONTRAST TECHNIQUE: Contiguous axial images were obtained from the base of the skull through the vertex without intravenous contrast. RADIATION DOSE REDUCTION: This exam was performed according to the departmental dose-optimization program which includes automated exposure control, adjustment of the mA and/or kV according to patient size and/or use of iterative reconstruction technique. COMPARISON:  CT 07/23/2021 FINDINGS: Brain: No acute territorial infarction, hemorrhage, or intracranial mass. Atrophy. Moderate chronic small vessel ischemic changes of the white matter. Prominent  ventricles likely related to atrophy, stable in size. Vascular: No hyperdense vessels. Vertebral and carotid vascular calcification. Skull: Normal. Negative for fracture or focal lesion. Sinuses/Orbits: Patchy mucosal thickening in the sinuses Other: None IMPRESSION: 1. No CT evidence for acute intracranial abnormality. 2. Atrophy and chronic small vessel ischemic changes of the white matter Electronically Signed   By: [name] Pang M.D.   On: 08/25/2022 17:50    Microbiology: Recent Results (from the past 240 hour(s))  Urine Culture     Status: Abnormal   Collection Time: 08/25/22  3:00 PM   Specimen: Urine, Clean Catch  Result Value Ref Range Status   Specimen Description   Final    URINE, CLEAN CATCH Performed at [facility], [address], [place], [place] [address]    Special Requests   Final    NONE Performed at [facility], [address] [place], [place] [address]    Culture (A)  Final     20,000 COLONIES/mL MULTIPLE SPECIES PRESENT, SUGGEST RECOLLECTION   Report Status 08/27/2022 FINAL  Final  Culture, blood (routine x 2)     Status: None (Preliminary result)   Collection Time: 08/25/22  5:18 PM   Specimen: BLOOD RIGHT FOREARM  Result Value Ref Range Status   Specimen Description BLOOD RIGHT FOREARM  Final   Special Requests   Final    BOTTLES DRAWN AEROBIC AND ANAEROBIC Blood Culture adequate volume   Culture   Final    NO GROWTH 2 DAYS Performed at [facility], [address], [place], [place] [address]    Report Status PENDING  Incomplete  Culture, blood (routine x 2)     Status: None (Preliminary result)   Collection Time: 08/25/22  5:24 PM   Specimen: Right Antecubital; Blood  Result Value Ref Range Status   Specimen Description RIGHT ANTECUBITAL  Final   Special Requests   Final    BOTTLES DRAWN AEROBIC AND ANAEROBIC Blood Culture adequate volume   Culture   Final    NO GROWTH 2 DAYS Performed at [facility], [address] [place], [place] [address]    Report Status PENDING  Incomplete  MRSA Next Gen by PCR, Nasal     Status: None   Collection Time: 08/26/22  2:59 PM   Specimen: Nasal Mucosa; Nasal Swab  Result Value Ref Range Status   MRSA by PCR Next Gen NOT DETECTED NOT DETECTED Final    Comment: (NOTE) The GeneXpert MRSA Assay (FDA approved for NASAL specimens only), is one component of a comprehensive MRSA colonization surveillance program. It is not intended to diagnose MRSA infection nor to guide or monitor treatment for MRSA infections. Test performance is not FDA approved in patients less than 79 years old. Performed at [facility], [address] [place], [place] [address]   SARS Coronavirus 2 by RT PCR (hospital order, performed in [facility] hospital lab) *cepheid single result test* Anterior Nasal Swab     Status: Abnormal   Collection Time: 08/26/22  4:16 PM   Specimen: Anterior Nasal Swab  Result  Value Ref Range Status   SARS Coronavirus 2 by RT PCR POSITIVE (A) NEGATIVE Final    Comment: (NOTE) SARS-CoV-2 target nucleic acids are DETECTED  SARS-CoV-2 RNA is generally detectable in upper respiratory specimens  during the acute phase of infection.  Positive results are indicative  of the presence of the identified virus, but do not rule out bacterial infection or co-infection with other  pathogens not detected by the test.  Clinical correlation with patient history and  other diagnostic information is necessary to determine patient infection status.  The expected result is negative.  Fact Sheet for Patients:   https://www.patel.info/   Fact Sheet for Healthcare Providers:   https://hall.com/    This test is not yet approved or cleared by the [place] FDA and  has been authorized for detection and/or diagnosis of SARS-CoV-2 by FDA under an Emergency Use Authorization (EUA).  This EUA will remain in effect (meaning this test can be used) for the duration of  the COVID-19 declaration under Section 564(b)(1)  of the Act, 21 U.S.C. section 360-bbb-3(b)(1), unless the authorization is terminated or revoked sooner.   Performed at [facility], [address] [place], [place] [address]      Labs: Basic Metabolic Panel: Recent Labs  Lab 08/25/22 1500 08/26/22 0425 08/26/22 1551 08/27/22 0723  NA 138 137 132* 135  K 4.7 4.3 3.7 3.9  CL 102 100 99 102  CO2 27 24 25 25   GLUCOSE 125* 108* 131* 100*  BUN 19 19 16 12   CREATININE 1.15* 1.12* 1.16* 0.91  CALCIUM 9.4 8.7* 8.3* 8.7*   Liver Function Tests: Recent Labs  Lab 08/25/22 1500 08/26/22 0425  AST 41 36  ALT 26 23  ALKPHOS 88 75  BILITOT 1.1 1.1  PROT 7.4 6.5  ALBUMIN 4.1 3.4*   No results for input(s): "LIPASE", "AMYLASE" in the last 168 hours. No results for input(s): "AMMONIA" in the last 168 hours. CBC: Recent Labs  Lab 08/25/22 1500  08/26/22 0425 08/27/22 0723  WBC 20.8* 18.6* 9.0  HGB 12.2 10.4* 9.5*  HCT 36.7 31.0* 28.9*  MCV 102.5* 102.0* 101.4*  PLT 234 189 169   Cardiac Enzymes: No results for input(s): "CKTOTAL", "CKMB", "CKMBINDEX", "TROPONINI" in the last 168 hours. BNP: BNP (last 3 results) No results for input(s): "BNP" in the last 8760 hours.  ProBNP (last 3 results) No results for input(s): "PROBNP" in the last 8760 hours.  CBG: No results for input(s): "GLUCAP" in the last 168 hours.     Signed:  Desma Maxim MD.  [facility] 08/27/2022, 1:06 PM

## 2022-08-27 NOTE — Evaluation (Signed)
Occupational Therapy Evaluation Patient Details Name: [name] MRN: [id number] DOB: June 18, 1930 Today's Date: 08/27/2022   History of Present Illness Kyarra Stear. Medwid is an 88 y/o female who presented to the ER secondary to AMS, progressive weakness; admitted for management of sepsis, metabolic encephalopathy related to UTI (and recent covid).   Clinical Impression   Patient seen for OT evaluation, son and caregiver present. Patient presenting with decreased cognition, safety awareness, strength, and endurance impacting safety and independence in ADLs. At baseline, pt requires assistance for all ADLs, IADLs, and functional mobility using a RW. Pt lives alone, however, has caregivers that come 7 days/wk to provide physical assistance. OT evaluation was limited this date 2/2 pt endorsing nausea. Anticipate pt requires Min A for functional transfers, Min A for grooming tasks and UB dressing, Mod-Max for toileting and LB dressing. Patient will benefit from acute OT to increase overall independence in the areas of ADLs and functional mobility in order to safely discharge home. Pt could benefit from [facility] following D/C, to decrease falls risk, improve balance, and assist pt in engaging in meaningful occupations. Pt would benefit from 24/7 supervision/assistance upon D/C 2/2 safety concerns with being home alone.    Recommendations for follow up therapy are one component of a multi-disciplinary discharge planning process, led by the attending physician.  Recommendations may be updated based on patient status, additional functional criteria and insurance authorization.   Follow Up Recommendations  Home health OT    Assistance Recommended at Discharge Frequent or constant Supervision/Assistance  Patient can return home with the following A little help with walking and/or transfers;A little help with bathing/dressing/bathroom;Assistance with cooking/housework;Direct supervision/assist for financial  management;Assist for transportation;Direct supervision/assist for medications management;Help with stairs or ramp for entrance    Functional Status Assessment  Patient has had a recent decline in their functional status and demonstrates the ability to make significant improvements in function in a reasonable and predictable amount of time.  Equipment Recommendations  None recommended by OT    Recommendations for Other Services       Precautions / Restrictions Precautions Precautions: Fall Restrictions Weight Bearing Restrictions: No      Mobility Bed Mobility               General bed mobility comments: NT, pt deferred OOB mobility 2/2 nausea    Transfers                          Balance                                           ADL either performed or assessed with clinical judgement   ADL Overall ADL's : Needs assistance/impaired     Grooming: Wash/dry face;Set up;Bed level                                 General ADL Comments: Pt deferred ADLs this date 2/2 nausea, however, agreeable to bed level grooming tasks.     Vision Baseline Vision/History: 1 Wears glasses Patient Visual Report: No change from baseline       Perception     Praxis      Pertinent Vitals/Pain Pain Assessment Pain Assessment: No/denies pain     Hand Dominance Right   Extremity/Trunk Assessment Upper  Extremity Assessment Upper Extremity Assessment: Overall WFL for tasks assessed   Lower Extremity Assessment Lower Extremity Assessment: Overall WFL for tasks assessed       Communication Communication Communication: HOH   Cognition Arousal/Alertness: Awake/alert Behavior During Therapy: Restless Overall Cognitive Status: Difficult to assess                                 General Comments: Oriented to self, month, and [facility]; inconsistently following one step commands. Pt making statements that  demonstrate she believes to be at home, asking if her clothes are on the floor in the bathroom or in the closet. Pt also demonstrated difficulty with STM, reported that PT came several weeks ago (evaluated this am).     General Comments       Exercises Other Exercises Other Exercises: OT provided education re: role of OT, OT POC, post acute recs, sitting up for all meals, EOB/OOB mobility with assistance, home/fall safety.     Shoulder Instructions      Home Living Family/patient expects to be discharged to:: Private residence Living Arrangements: Alone Available Help at Discharge: Family;Personal care attendant;Available PRN/intermittently Type of Home: House Home Access: Ramped entrance     Home Layout: Two level;Able to live on main level with bedroom/bathroom     Bathroom Shower/Tub: Sponge bathes at baseline   Bathroom Toilet: Standard     Home Equipment: Conservation officer, nature (2 wheels)   Additional Comments: Per caregiver at bedside, has caregiver assist 4-8 hours/day 7 days/wk; alone at night.      Prior Functioning/Environment Prior Level of Function : Needs assist  Cognitive Assist : ADLs (cognitive)     Physical Assist : ADLs (physical);Mobility (physical)     Mobility Comments: At baseline, supervision/mod indep with RW for mobility; has been requiring cga/min assist since recent illness ADLs Comments: Caregivers provide assistance with all ADLs/IADLs - cooking, cleaning, laundry, driving, and medication management. Pt washes up at the sink.        OT Problem List: Decreased strength;Decreased knowledge of use of DME or AE;Decreased activity tolerance;Impaired balance (sitting and/or standing);Decreased cognition;Decreased safety awareness      OT Treatment/Interventions: Self-care/ADL training;Therapeutic exercise;Patient/family education;Balance training;Energy conservation;Therapeutic activities;DME and/or AE instruction;Cognitive remediation/compensation     OT Goals(Current goals can be found in the care plan section) Acute Rehab OT Goals OT Goal Formulation: With family Time For Goal Achievement: 09/10/22 Potential to Achieve Goals: Good ADL Goals Pt Will Perform Grooming: standing;with supervision Pt Will Perform Upper Body Dressing: sitting;with supervision Pt Will Perform Lower Body Dressing: with min assist;sitting/lateral leans;sit to/from stand Pt Will Transfer to Toilet: stand pivot transfer;bedside commode;with min guard assist Pt Will Perform Toileting - Clothing Manipulation and hygiene: with min guard assist;sit to/from stand  OT Frequency: Min 2X/week    Co-evaluation              AM-PAC OT "6 Clicks" Daily Activity     Outcome Measure Help from another person eating meals?: None Help from another person taking care of personal grooming?: A Little Help from another person toileting, which includes using toliet, bedpan, or urinal?: A Lot Help from another person bathing (including washing, rinsing, drying)?: A Lot Help from another person to put on and taking off regular upper body clothing?: A Little Help from another person to put on and taking off regular lower body clothing?: A Lot 6 Click Score: 16   End  of Session Nurse Communication: Other (comment) (requesting nausea medication)  Activity Tolerance: Treatment limited secondary to medical complications (Comment) (nausea) Patient left: in bed;with call bell/phone within reach;with bed alarm set;with nursing/sitter in room;with family/visitor present  OT Visit Diagnosis: Muscle weakness (generalized) (M62.81);Other symptoms and signs involving cognitive function;Other abnormalities of gait and mobility (R26.89)                Time: 9528-4132 OT Time Calculation (min): 28 min Charges:  OT General Charges $OT Visit: 1 Visit OT Evaluation $OT Eval Low Complexity: 1 Low  [facility] MS, OTR/L ascom [phone]  08/27/22, 1:32 PM

## 2022-08-27 NOTE — TOC Transition Note (Addendum)
Transition of Care [facility]) - CM/SW Discharge Note   Patient Details  Name: [name] MRN: [id number] Date of Birth: [birth year]  [facility]) CM/SW Contact:  Harriet Masson, RN Phone Number:[phone] 08/27/2022, 2:07 PM   Clinical Narrative:    TOC spoke with pt concerning PT recommendations and pt receptive to accepting however mentioned agency starting with and "E". Attempted referral with [facility] (lvm left to Meg) and [facility] Corene Cornea) who accepted this pt for PT services. Lvm with son Randall Hiss concerning discharged disposition for today and other details left with Rise Paganini Freight forwarder) in the room to relay to the son upon her discharge today.   Pt states she lives alone however has 3 different sitters throughout the day. Pt PCP is Dr. Doy Hutching and she goes through [name] for her medications. No other needs at this time as pt has a very good support system for her ongoing care in the home.   TOC team aware of the arrangements with no other needs requested at this time.  Final next level of care: [place] Barriers to Discharge: Barriers Resolved   Patient Goals and CMS Choice     Choice offered to / list presented to : Patient  Discharge Placement                  Name of family member notified: Randall Hiss (son) and Rise Paganini Freight forwarder) Patient and family notified of of transfer: 08/27/22  Discharge Plan and Services                          HH Arranged: PT [facility] Agency: [place] ([place]) Date HH Agency Contacted: 08/27/22 Time [place]: [phone] Representative spoke with at [place]: [place] ([place]) Interventions     Readmission Risk Interventions    07/25/2021   10:49 AM  Readmission Risk Prevention Plan  Transportation Screening Complete  PCP or Specialist Appt within 5-7 Days Complete  Home Care Screening Complete  Medication Review (RN CM) Complete

## 2022-08-27 NOTE — Progress Notes (Signed)
PHARMACIST - PHYSICIAN COMMUNICATION  CONCERNING:  Enoxaparin (Lovenox) for DVT Prophylaxis    RECOMMENDATION: Patient was prescribed enoxaparin 30mg  q24 hours for VTE prophylaxis.   Filed Weights   08/25/22 1440 08/25/22 2203  Weight: 63.5 kg (140 lb) 64.7 kg (142 lb 10.2 oz)    Body mass index is 27.86 kg/m.  Estimated Creatinine Clearance: 35.2 mL/min (by C-G formula based on SCr of 0.91 mg/dL).  Patient is candidate for enoxaparin 40mg  every 24 hours based on CrCl >84ml/min and Weight >45kg  DESCRIPTION: Pharmacy has adjusted enoxaparin dose per [facility] policy.  Patient is now receiving enoxaparin 40 mg every 24 hours   Benita Gutter 08/27/2022 12:03 PM

## 2022-08-27 NOTE — Progress Notes (Signed)
MD order received in [facility] to discharge pt home with home health today; TOC previously established Home Health Physical Therapy services with [facility]; verbally reviewed AVS with pt and pt's son, Indiana Gamero; no questions voiced at this time; pt discharged via wheelchair by nursing to the [place] entrance

## 2022-08-27 NOTE — Discharge Instructions (Signed)
[place] [facility]

## 2022-08-27 NOTE — Evaluation (Signed)
Physical Therapy Evaluation Patient Details Name: [name] MRN: [id number] DOB: [birth year] Today's Date: 08/27/2022  History of Present Illness  presented to ER secondary to AMS, progressive weakness; admitted for management of sepsis, metabolic encephalopathy related to UTI (and recent covid)  Clinical Impression  Patient seated edge of bed with CNA/RN upon arrival to room, completing AM ADL activities.  Patient alert and oriented to self only; believes self to be in home environment (often referring to "clothes over there" as if in home).  Inconsistently follows commands; generally limited by [facility], general cognitive deficits and both internal/external distractions (generally restless and fidgeting with things throughout session).  Bilat Ue/LE strength and ROM grossly symmetrical and WFL; no focal weakness appreciated.  Able to complete bed mobility with min/mod assist; sit/stand, basic transfers and toileting with RW, min assist for safety.  Constant assist for balance/safety; constant assist for walker position and management (to help guide movement and task completion).  Additional gait deferred due to breakfast tray arrival; will continue to assess/progress in subsequent sessions as appropriate. Of note, patient audibly wheezing during session; sats >94% on RA at rest and with activity. Would benefit from skilled PT to address above deficits and promote optimal return to PLOF.; Recommend transition to HHPT upon discharge from acute hospitalization, anticipating need for 24 hour sup upon initial discharge for safety purposes.         Recommendations for follow up therapy are one component of a multi-disciplinary discharge planning process, led by the attending physician.  Recommendations may be updated based on patient status, additional functional criteria and insurance authorization.  Follow Up Recommendations Home health PT      Assistance Recommended at Discharge Frequent or constant  Supervision/Assistance  Patient can return home with the following  A little help with walking and/or transfers;A little help with bathing/dressing/bathroom    Equipment Recommendations    Recommendations for Other Services       Functional Status Assessment Patient has had a recent decline in their functional status and demonstrates the ability to make significant improvements in function in a reasonable and predictable amount of time.     Precautions / Restrictions Precautions Precautions: Fall Restrictions Weight Bearing Restrictions: No      Mobility  Bed Mobility               General bed mobility comments: seated edge of bed with CNA/RN on admission to room    Transfers Overall transfer level: Needs assistance Equipment used: Rolling walker (2 wheels) Transfers: Sit to/from Stand, Bed to chair/wheelchair/BSC Sit to Stand: Min assist Stand pivot transfers: Min assist         General transfer comment: pulls on RW despite cuing; constant assist for walker position and management (to guide movement) due to cognitive deficits    Ambulation/Gait               General Gait Details: deferred this date due to breakfast arrival  Stairs            Wheelchair Mobility    Modified Rankin (Stroke Patients Only)       Balance Overall balance assessment: Needs assistance Sitting-balance support: No upper extremity supported, Feet supported Sitting balance-Leahy Scale: Good     Standing balance support: Bilateral upper extremity supported Standing balance-Leahy Scale: Fair                               Pertinent  Vitals/Pain Pain Assessment Pain Assessment: No/denies pain    Home Living Family/patient expects to be discharged to:: Private residence Living Arrangements: Alone Available Help at Discharge: Family;Personal care attendant;Available PRN/intermittently Type of Home: House Home Access: Ramped entrance       Home  Layout: Two level;Able to live on main level with bedroom/bathroom Home Equipment: Rolling Walker (2 wheels) Additional Comments: Per caregiver at bedside, has caregiver assist 4-8 hours/day; alone at night.    Prior Function Prior Level of Function : Needs assist             Mobility Comments: At baseline, supervision/mod indep with RW for mobilitiy; has been requiring cga/min assist since recent illness       Hand Dominance   Dominant Hand: Right    Extremity/Trunk Assessment   Upper Extremity Assessment Upper Extremity Assessment: Overall WFL for tasks assessed    Lower Extremity Assessment Lower Extremity Assessment: Overall WFL for tasks assessed (grossly at least 4-/5 throughout; no focal weakness appreciated)       Communication   Communication: HOH  Cognition Arousal/Alertness: Awake/alert Behavior During Therapy: Restless Overall Cognitive Status: Difficult to assess (due to [facility])                                 General Comments: Oriented to self only; inconsistently follows commands (limited by [facility], cognition and internal/external distraction).  Believes self to be in her home, often referring to "clothes over there", or "medicine in the cabinet in the bathroom"        General Comments      Exercises Other Exercises Other Exercises: Toilet transfer, SPT with RW, min assist (constant cuing/assist for RW management); min assist for sit/stand and static standing balance with RW; mod assist for LBD and clothing management; dep assist for peri-care   Assessment/Plan    PT Assessment Patient needs continued PT services  PT Problem List Decreased activity tolerance;Decreased balance;Decreased mobility;Decreased cognition;Decreased knowledge of use of DME;Decreased safety awareness;Decreased knowledge of precautions;Cardiopulmonary status limiting activity       PT Treatment Interventions DME instruction;Gait training;Functional mobility  training;Therapeutic activities;Therapeutic exercise;Balance training;Patient/family education    PT Goals (Current goals can be found in the Care Plan section)  Acute Rehab PT Goals PT Goal Formulation: Patient unable to participate in goal setting Time For Goal Achievement: 09/10/22 Potential to Achieve Goals: Fair    Frequency Min 2X/week     Co-evaluation               AM-PAC PT "6 Clicks" Mobility  Outcome Measure Help needed turning from your back to your side while in a flat bed without using bedrails?: A Little Help needed moving from lying on your back to sitting on the side of a flat bed without using bedrails?: A Lot Help needed moving to and from a bed to a chair (including a wheelchair)?: A Little Help needed standing up from a chair using your arms (e.g., wheelchair or bedside chair)?: A Little Help needed to walk in hospital room?: A Little Help needed climbing 3-5 steps with a railing? : A Lot 6 Click Score: 16    End of Session   Activity Tolerance: Patient tolerated treatment well Patient left: in bed;with call bell/phone within reach;with bed alarm set Nurse Communication: Mobility status PT Visit Diagnosis: Muscle weakness (generalized) (M62.81);Difficulty in walking, not elsewhere classified (R26.2)    Time: [id number] PT Time Calculation (min) (  ACUTE ONLY): 25 min   Charges:   PT Evaluation $PT Eval Moderate Complexity: 1 Mod PT Treatments $Therapeutic Activity: 8-22 mins        Rashawd Laskaris H. Owens Shark, PT, DPT, NCS 08/27/22, 9:16 AM [phone]

## 2022-08-27 NOTE — Plan of Care (Signed)

## 2022-08-30 LAB — CULTURE, BLOOD (ROUTINE X 2)
Culture: NO GROWTH
Culture: NO GROWTH
Special Requests: ADEQUATE
Special Requests: ADEQUATE

## 2022-11-23 ENCOUNTER — Emergency Department
Admission: EM | Admit: 2022-11-23 | Discharge: 2022-11-23 | Disposition: A | Discharge status: Home / Self Care | Payer: Medicare Other | Attending: Student in an Organized Health Care Education/Training Program | Admitting: Student in an Organized Health Care Education/Training Program

## 2022-11-23 ENCOUNTER — Other Ambulatory Visit: Payer: Self-pay

## 2022-11-23 ENCOUNTER — Emergency Department: Payer: Medicare Other

## 2022-11-23 DIAGNOSIS — R410 Disorientation, unspecified: Secondary | ICD-10-CM | POA: Insufficient documentation

## 2022-11-23 DIAGNOSIS — Z1152 Encounter for screening for COVID-19: Secondary | ICD-10-CM | POA: Diagnosis not present

## 2022-11-23 DIAGNOSIS — I10 Essential (primary) hypertension: Secondary | ICD-10-CM | POA: Diagnosis not present

## 2022-11-23 DIAGNOSIS — R519 Headache, unspecified: Secondary | ICD-10-CM

## 2022-11-23 DIAGNOSIS — I159 Secondary hypertension, unspecified: Secondary | ICD-10-CM

## 2022-11-23 DIAGNOSIS — N390 Urinary tract infection, site not specified: Secondary | ICD-10-CM

## 2022-11-23 LAB — URINALYSIS, ROUTINE W REFLEX MICROSCOPIC
Bilirubin Urine: NEGATIVE
Glucose, UA: NEGATIVE mg/dL
Ketones, ur: NEGATIVE mg/dL
Nitrite: NEGATIVE
Protein, ur: 30 mg/dL — AB
Specific Gravity, Urine: 1.015 (ref 1.005–1.030)
pH: 6 (ref 5.0–8.0)

## 2022-11-23 LAB — CBC
HCT: 40.6 % (ref 36.0–46.0)
Hemoglobin: 13.6 g/dL (ref 12.0–15.0)
MCH: 32.6 pg (ref 26.0–34.0)
MCHC: 33.5 g/dL (ref 30.0–36.0)
MCV: 97.4 fL (ref 80.0–100.0)
Platelets: 242 10*3/uL (ref 150–400)
RBC: 4.17 MIL/uL (ref 3.87–5.11)
RDW: 13.1 % (ref 11.5–15.5)
WBC: 8.2 10*3/uL (ref 4.0–10.5)
nRBC: 0 % (ref 0.0–0.2)

## 2022-11-23 LAB — COMPREHENSIVE METABOLIC PANEL
ALT: 22 U/L (ref 0–44)
AST: 34 U/L (ref 15–41)
Albumin: 4.3 g/dL (ref 3.5–5.0)
Alkaline Phosphatase: 77 U/L (ref 38–126)
Anion gap: 7 (ref 5–15)
BUN: 21 mg/dL (ref 8–23)
CO2: 26 mmol/L (ref 22–32)
Calcium: 9.3 mg/dL (ref 8.9–10.3)
Chloride: 103 mmol/L (ref 98–111)
Creatinine, Ser: 0.96 mg/dL (ref 0.44–1.00)
GFR, Estimated: 56 mL/min — ABNORMAL LOW (ref >60)
Glucose, Bld: 105 mg/dL — ABNORMAL HIGH (ref 70–99)
Potassium: 4.7 mmol/L (ref 3.5–5.1)
Sodium: 136 mmol/L (ref 135–145)
Total Bilirubin: 0.8 mg/dL (ref 0.3–1.2)
Total Protein: 7.8 g/dL (ref 6.5–8.1)

## 2022-11-23 LAB — CBG MONITORING, ED: Glucose-Capillary: 95 mg/dL (ref 70–99)

## 2022-11-23 LAB — RESP PANEL BY RT-PCR (RSV, FLU A&B, COVID)  RVPGX2
Influenza A by PCR: NEGATIVE
Influenza B by PCR: NEGATIVE
Resp Syncytial Virus by PCR: NEGATIVE
SARS Coronavirus 2 by RT PCR: NEGATIVE

## 2022-11-23 MED ORDER — PROPRANOLOL HCL ER 60 MG PO CP24
60.0000 mg | ORAL_CAPSULE | Freq: Every day | ORAL | Status: DC
  Administered 2022-11-23: 60 mg via ORAL
  Filled 2022-11-23: qty 1

## 2022-11-23 MED ORDER — CEFADROXIL 500 MG PO CAPS: 500.0000 mg | ORAL_CAPSULE | Freq: Two times a day (BID) | ORAL | 0 refills | Status: AC

## 2022-11-23 MED ORDER — ACETAMINOPHEN 325 MG PO TABS
650.0000 mg | ORAL_TABLET | Freq: Once | ORAL | Status: AC
  Administered 2022-11-23: 650 mg via ORAL
  Filled 2022-11-23: qty 2

## 2022-11-23 MED ORDER — AMLODIPINE BESYLATE 5 MG PO TABS
5.0000 mg | ORAL_TABLET | Freq: Once | ORAL | Status: AC
  Administered 2022-11-23: 5 mg via ORAL
  Filled 2022-11-23: qty 1

## 2022-11-23 NOTE — ED Notes (Signed)
,  bfd

## 2022-11-23 NOTE — ED Notes (Signed)
EKG to EDP McHugh in person.

## 2022-11-23 NOTE — ED Notes (Signed)
Pt denies being on blood thinners/anticoagulants.

## 2022-11-23 NOTE — ED Notes (Signed)
First nurse note: Pt here from [facility] clinic due to R sided headache and auditory hallucinations.  Caregiver with pt.

## 2022-11-23 NOTE — ED Provider Notes (Signed)
[facility] Provider Note    Event Date/Time   First MD Initiated Contact with Patient 11/23/22 1338     (approximate)   History   Headache   HPI  [name] is a 88 y.o. female history of rheumatoid arthritis, COPD, hypertension, mild cognitive impairment, pacemaker presents to the ER for evaluation of headache that started this morning.  Left-sided and frontal.  No photophobia no phonophobia no neck pain.  Not sudden onset.  No associated nausea or vomiting.  Did not take her blood pressure medications.  Denies any numbness or tingling.  No trouble walking.  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  States that over the past few weeks to months she has noted steady decline.  This morning patient reported was thing that she was hearing music which is abnormal for her heart there is no music playing.       Physical Exam   Triage Vital Signs: ED Triage Vitals  Enc Vitals Group     BP 11/23/22 1147 (!) 197/93     Pulse Rate 11/23/22 1147 88     Resp 11/23/22 1147 17     Temp 11/23/22 1147 98.4 F (36.9 C)     Temp Source 11/23/22 1147 Oral     SpO2 11/23/22 1147 95 %     Weight 11/23/22 1154 140 lb (63.5 kg)     Height 11/23/22 1154 5\' 3"  (1.6 m)     Head Circumference --      Peak Flow --      Pain Score 11/23/22 1152 7     Pain Loc --      Pain Edu? --      Excl. in GC? --     Most recent vital signs: Vitals:   11/23/22 1147  BP: (!) 197/93  Pulse: 88  Resp: 17  Temp: 98.4 F (36.9 C)  SpO2: 95%     Constitutional: Alert  Eyes: Conjunctivae are normal.  Head: Atraumatic. Nose: No congestion/rhinnorhea. Mouth/Throat: Mucous membranes are moist.   Neck: Painless ROM.  Cardiovascular:   Good peripheral circulation. Respiratory: Normal respiratory effort.  No retractions.  Gastrointestinal: Soft and nontender.  Musculoskeletal:  no deformity Neurologic:  MAE spontaneously. No gross focal neurologic deficits are appreciated.  No  neck stiffness.  Cranial nerves are intact.  Disoriented to year. Skin:  Skin is warm, dry and intact. No rash noted. Psychiatric: Mood and affect are normal. Speech and behavior are normal.    ED Results / Procedures / Treatments   Labs (all labs ordered are listed, but only abnormal results are displayed) Labs Reviewed  URINALYSIS, ROUTINE W REFLEX MICROSCOPIC - Abnormal; Notable for the following components:      Result Value   Color, Urine YELLOW (*)    APPearance CLEAR (*)    Hgb urine dipstick MODERATE (*)    Protein, ur 30 (*)    Leukocytes,Ua TRACE (*)    Bacteria, UA RARE (*)    All other components within normal limits  COMPREHENSIVE METABOLIC PANEL - Abnormal; Notable for the following components:   Glucose, Bld 105 (*)    GFR, Estimated 56 (*)    All other components within normal limits  RESP PANEL BY RT-PCR (RSV, FLU A&B, COVID)  [id number]  URINE CULTURE  CBC  CBG MONITORING, ED  CBG MONITORING, ED     EKG  ED ECG REPORT I, 11/25/22, the attending physician, personally viewed and interpreted this ECG.  Date: 11/23/2022  EKG Time: 12:08  Rate: 85  Rhythm: paced  Axis: left  Intervals: normal  ST&T Change: paced rhythm, no stemi    RADIOLOGY Please see ED Course for my review and interpretation.  I personally reviewed all radiographic images ordered to evaluate for the above acute complaints and reviewed radiology reports and findings.  These findings were personally discussed with the patient.  Please see medical record for radiology report.    PROCEDURES:  Critical Care performed: No  Procedures   MEDICATIONS ORDERED IN ED: Medications  propranolol ER (INDERAL LA) 24 hr capsule 60 mg (60 mg Oral Given 11/23/22 1507)  amLODipine (NORVASC) tablet 5 mg (5 mg Oral Given 11/23/22 1459)  acetaminophen (TYLENOL) tablet 650 mg (650 mg Oral Given 11/23/22 1459)     IMPRESSION / MDM / ASSESSMENT AND PLAN / ED COURSE  I reviewed the  triage vital signs and the nursing notes.                              Differential diagnosis includes, but is not limited to, hypertensive urgency, electrolyte abnormality, SAH, IPH, mass, tension, UTI, dementia  Patient presenting to the ER for evaluation of symptoms as described above.  Based on symptoms, risk factors and considered above differential, this presenting complaint could reflect a potentially life-threatening illness therefore the patient will be placed on continuous pulse oximetry and telemetry for monitoring.  Laboratory evaluation will be sent to evaluate for the above complaints.  Does not have any focal deficits.  She is hypertensive but well-appearing.  Does not seem clinically consistent with meningitis or encephalitis.  6 CT imaging will be ordered to evaluate for mass or bleed.  Low suspicion for subarachnoid.    Clinical Course as of 11/23/22 1522  Wed Nov 23, 2022  1348 CT imaging on my review and interpretation does not show any evidence of mass or bleed. [PR]  1511 Patient reassessed remains well-appearing.  She denies any chest pain or shortness of breath.  Urinalysis does have rare bacteria.  States that she does get UTIs that will cause some confusion from time to time.  Given her lack of focal deficits and well appearance I do not feel that further diagnostic imaging clinically indicated from a neurostandpoint at this point.  Not amenable to MRI as she has a pacemaker.  Will test for viral illnesses.  Will give symptomatic management.  Will treat for UTI.  I do suspect that there is likely some underlying dementia given her baseline confusion.  Patient does appear stable and appropriate for outpatient follow-up. [PR]    Clinical Course User Index [PR] Willy Eddy, MD    FINAL CLINICAL IMPRESSION(S) / ED DIAGNOSES   Final diagnoses:  Secondary hypertension  Nonintractable headache, unspecified chronicity pattern, unspecified headache type     Rx / DC  Orders   ED Discharge Orders          Ordered    cefadroxil (DURICEF) 500 MG capsule  2 times daily        11/23/22 1522             Note:  This document was prepared using Dragon voice recognition software and may include unintentional dictation errors.    Willy Eddy, MD 11/23/22 [phone]

## 2022-11-23 NOTE — Discharge Instructions (Addendum)
Please seek medical attention for any high fevers, chest pain, shortness of breath, change in behavior, persistent vomiting, bloody stool or any other new or concerning symptoms.  

## 2022-11-23 NOTE — ED Notes (Signed)
Pt denies SOB, CP, speech is currently clear, pt is hypertensive.

## 2022-11-23 NOTE — ED Triage Notes (Signed)
Pt in from [facility] with R-sided headache; pt hearing music this morning per caregiver though no music was playing; caregiver states chronic confusion; history of PM, COPD, and needs to have PM updated soon per caregiver. Caregiver states pt's strength, balance, and speech are pt's baseline. Pt A&Ox3 (states year is "1970").

## 2022-11-23 NOTE — ED Notes (Signed)
Blue, lt grn and lav tubes sent to lab.  

## 2022-11-24 LAB — URINE CULTURE: Culture: <10000 — AB

## 2023-05-02 DIAGNOSIS — E039 Hypothyroidism, unspecified: Secondary | ICD-10-CM | POA: Diagnosis present

## 2023-05-07 ENCOUNTER — Other Ambulatory Visit: Payer: Self-pay

## 2023-05-07 ENCOUNTER — Emergency Department: Payer: Medicare Other

## 2023-05-07 ENCOUNTER — Inpatient Hospital Stay
Admission: EM | Admit: 2023-05-07 | Discharge: 2023-05-16 | DRG: 194 | Disposition: A | Discharge status: Home Health | Payer: Medicare Other | Attending: Obstetrics and Gynecology | Admitting: Obstetrics and Gynecology

## 2023-05-07 ENCOUNTER — Encounter: Payer: Self-pay | Admitting: Internal Medicine

## 2023-05-07 ENCOUNTER — Inpatient Hospital Stay: Payer: Medicare Other

## 2023-05-07 DIAGNOSIS — Z880 Allergy status to penicillin: Secondary | ICD-10-CM

## 2023-05-07 DIAGNOSIS — E039 Hypothyroidism, unspecified: Secondary | ICD-10-CM | POA: Diagnosis present

## 2023-05-07 DIAGNOSIS — I10 Essential (primary) hypertension: Secondary | ICD-10-CM

## 2023-05-07 DIAGNOSIS — E8770 Fluid overload, unspecified: Secondary | ICD-10-CM | POA: Diagnosis present

## 2023-05-07 DIAGNOSIS — I442 Atrioventricular block, complete: Secondary | ICD-10-CM | POA: Diagnosis present

## 2023-05-07 DIAGNOSIS — R410 Disorientation, unspecified: Secondary | ICD-10-CM | POA: Diagnosis not present

## 2023-05-07 DIAGNOSIS — N1831 Chronic kidney disease, stage 3a: Secondary | ICD-10-CM | POA: Diagnosis present

## 2023-05-07 DIAGNOSIS — Z950 Presence of cardiac pacemaker: Secondary | ICD-10-CM | POA: Diagnosis not present

## 2023-05-07 DIAGNOSIS — I739 Peripheral vascular disease, unspecified: Secondary | ICD-10-CM | POA: Diagnosis present

## 2023-05-07 DIAGNOSIS — J441 Chronic obstructive pulmonary disease with (acute) exacerbation: Secondary | ICD-10-CM | POA: Diagnosis present

## 2023-05-07 DIAGNOSIS — K219 Gastro-esophageal reflux disease without esophagitis: Secondary | ICD-10-CM | POA: Diagnosis present

## 2023-05-07 DIAGNOSIS — Z888 Allergy status to other drugs, medicaments and biological substances status: Secondary | ICD-10-CM

## 2023-05-07 DIAGNOSIS — R0603 Acute respiratory distress: Secondary | ICD-10-CM | POA: Diagnosis present

## 2023-05-07 DIAGNOSIS — Z90710 Acquired absence of both cervix and uterus: Secondary | ICD-10-CM | POA: Diagnosis not present

## 2023-05-07 DIAGNOSIS — I878 Other specified disorders of veins: Secondary | ICD-10-CM | POA: Diagnosis present

## 2023-05-07 DIAGNOSIS — M069 Rheumatoid arthritis, unspecified: Secondary | ICD-10-CM | POA: Diagnosis present

## 2023-05-07 DIAGNOSIS — N3000 Acute cystitis without hematuria: Secondary | ICD-10-CM | POA: Diagnosis present

## 2023-05-07 DIAGNOSIS — R0609 Other forms of dyspnea: Secondary | ICD-10-CM | POA: Diagnosis present

## 2023-05-07 DIAGNOSIS — Z1152 Encounter for screening for COVID-19: Secondary | ICD-10-CM

## 2023-05-07 DIAGNOSIS — Z96652 Presence of left artificial knee joint: Secondary | ICD-10-CM | POA: Diagnosis present

## 2023-05-07 DIAGNOSIS — J9601 Acute respiratory failure with hypoxia: Secondary | ICD-10-CM | POA: Diagnosis present

## 2023-05-07 DIAGNOSIS — Z803 Family history of malignant neoplasm of breast: Secondary | ICD-10-CM

## 2023-05-07 DIAGNOSIS — I129 Hypertensive chronic kidney disease with stage 1 through stage 4 chronic kidney disease, or unspecified chronic kidney disease: Secondary | ICD-10-CM | POA: Diagnosis present

## 2023-05-07 DIAGNOSIS — R7303 Prediabetes: Secondary | ICD-10-CM | POA: Diagnosis present

## 2023-05-07 DIAGNOSIS — G3184 Mild cognitive impairment, so stated: Secondary | ICD-10-CM | POA: Diagnosis present

## 2023-05-07 DIAGNOSIS — I5031 Acute diastolic (congestive) heart failure: Secondary | ICD-10-CM | POA: Diagnosis not present

## 2023-05-07 DIAGNOSIS — R262 Difficulty in walking, not elsewhere classified: Secondary | ICD-10-CM | POA: Diagnosis present

## 2023-05-07 DIAGNOSIS — E871 Hypo-osmolality and hyponatremia: Secondary | ICD-10-CM | POA: Diagnosis not present

## 2023-05-07 DIAGNOSIS — R531 Weakness: Secondary | ICD-10-CM

## 2023-05-07 DIAGNOSIS — R4182 Altered mental status, unspecified: Secondary | ICD-10-CM | POA: Diagnosis present

## 2023-05-07 DIAGNOSIS — Z885 Allergy status to narcotic agent status: Secondary | ICD-10-CM | POA: Diagnosis not present

## 2023-05-07 DIAGNOSIS — R000 Tachycardia, unspecified: Secondary | ICD-10-CM | POA: Diagnosis present

## 2023-05-07 DIAGNOSIS — M810 Age-related osteoporosis without current pathological fracture: Secondary | ICD-10-CM | POA: Diagnosis present

## 2023-05-07 DIAGNOSIS — R270 Ataxia, unspecified: Secondary | ICD-10-CM | POA: Diagnosis present

## 2023-05-07 DIAGNOSIS — Z79899 Other long term (current) drug therapy: Secondary | ICD-10-CM | POA: Diagnosis not present

## 2023-05-07 DIAGNOSIS — E639 Nutritional deficiency, unspecified: Secondary | ICD-10-CM | POA: Diagnosis present

## 2023-05-07 DIAGNOSIS — Z881 Allergy status to other antibiotic agents status: Secondary | ICD-10-CM | POA: Diagnosis not present

## 2023-05-07 DIAGNOSIS — M0609 Rheumatoid arthritis without rheumatoid factor, multiple sites: Secondary | ICD-10-CM

## 2023-05-07 DIAGNOSIS — G629 Polyneuropathy, unspecified: Secondary | ICD-10-CM | POA: Diagnosis present

## 2023-05-07 DIAGNOSIS — J101 Influenza due to other identified influenza virus with other respiratory manifestations: Principal | ICD-10-CM | POA: Diagnosis present

## 2023-05-07 LAB — CBC
HCT: 34.7 % — ABNORMAL LOW (ref 36.0–46.0)
Hemoglobin: 12 g/dL (ref 12.0–15.0)
MCH: 33.4 pg (ref 26.0–34.0)
MCHC: 34.6 g/dL (ref 30.0–36.0)
MCV: 96.7 fL (ref 80.0–100.0)
Platelets: 160 10*3/uL (ref 150–400)
RBC: 3.59 MIL/uL — ABNORMAL LOW (ref 3.87–5.11)
RDW: 11.9 % (ref 11.5–15.5)
WBC: 7.4 10*3/uL (ref 4.0–10.5)
nRBC: 0 % (ref 0.0–0.2)

## 2023-05-07 LAB — TROPONIN I (HIGH SENSITIVITY): Troponin I (High Sensitivity): 16 ng/L (ref <18)

## 2023-05-07 LAB — HEPATIC FUNCTION PANEL
ALT: 22 U/L (ref 0–44)
AST: 38 U/L (ref 15–41)
Albumin: 3.6 g/dL (ref 3.5–5.0)
Alkaline Phosphatase: 70 U/L (ref 38–126)
Bilirubin, Direct: 0.2 mg/dL (ref 0.0–0.2)
Indirect Bilirubin: 0.7 mg/dL (ref 0.3–0.9)
Total Bilirubin: 0.9 mg/dL (ref 0.3–1.2)
Total Protein: 6.6 g/dL (ref 6.5–8.1)

## 2023-05-07 LAB — URINALYSIS, ROUTINE W REFLEX MICROSCOPIC
Bacteria, UA: NONE SEEN
Bilirubin Urine: NEGATIVE
Glucose, UA: NEGATIVE mg/dL
Ketones, ur: 5 mg/dL — AB
Leukocytes,Ua: NEGATIVE
Nitrite: NEGATIVE
Protein, ur: NEGATIVE mg/dL
Specific Gravity, Urine: 1.005 (ref 1.005–1.030)
Squamous Epithelial / HPF: NONE SEEN /HPF (ref 0–5)
WBC, UA: NONE SEEN WBC/hpf (ref 0–5)
pH: 7 (ref 5.0–8.0)

## 2023-05-07 LAB — BASIC METABOLIC PANEL
Anion gap: 11 (ref 5–15)
BUN: 14 mg/dL (ref 8–23)
CO2: 23 mmol/L (ref 22–32)
Calcium: 8.6 mg/dL — ABNORMAL LOW (ref 8.9–10.3)
Chloride: 94 mmol/L — ABNORMAL LOW (ref 98–111)
Creatinine, Ser: 0.83 mg/dL (ref 0.44–1.00)
GFR, Estimated: >60 mL/min (ref >60)
Glucose, Bld: 105 mg/dL — ABNORMAL HIGH (ref 70–99)
Potassium: 4.1 mmol/L (ref 3.5–5.1)
Sodium: 128 mmol/L — ABNORMAL LOW (ref 135–145)

## 2023-05-07 LAB — SARS CORONAVIRUS 2 BY RT PCR: SARS Coronavirus 2 by RT PCR: NEGATIVE

## 2023-05-07 LAB — OSMOLALITY, URINE: Osmolality, Ur: 301 mOsm/kg (ref 300–900)

## 2023-05-07 LAB — SODIUM, URINE, RANDOM: Sodium, Ur: 114 mmol/L

## 2023-05-07 LAB — BRAIN NATRIURETIC PEPTIDE: B Natriuretic Peptide: 573.1 pg/mL — ABNORMAL HIGH (ref 0.0–100.0)

## 2023-05-07 LAB — PROCALCITONIN: Procalcitonin: <0.1 ng/mL

## 2023-05-07 MED ORDER — ENOXAPARIN SODIUM 40 MG/0.4ML IJ SOSY
40.0000 mg | PREFILLED_SYRINGE | INTRAMUSCULAR | Status: DC
  Administered 2023-05-08 – 2023-05-15 (×8): 40 mg via SUBCUTANEOUS
  Filled 2023-05-07 (×11): qty 0.4

## 2023-05-07 MED ORDER — ONDANSETRON HCL 4 MG PO TABS: 4.0000 mg | ORAL_TABLET | Freq: Four times a day (QID) | ORAL | Status: DC | PRN

## 2023-05-07 MED ORDER — ONDANSETRON HCL 4 MG/2ML IJ SOLN: 4.0000 mg | Freq: Four times a day (QID) | INTRAMUSCULAR | Status: DC | PRN

## 2023-05-07 MED ORDER — PREDNISONE 20 MG PO TABS
60.0000 mg | ORAL_TABLET | Freq: Once | ORAL | Status: AC
  Administered 2023-05-07: 60 mg via ORAL
  Filled 2023-05-07: qty 3

## 2023-05-07 MED ORDER — PROPRANOLOL HCL ER 60 MG PO CP24
60.0000 mg | ORAL_CAPSULE | Freq: Every day | ORAL | Status: DC
  Administered 2023-05-08 – 2023-05-16 (×8): 60 mg via ORAL
  Filled 2023-05-07 (×9): qty 1

## 2023-05-07 MED ORDER — PREDNISONE 20 MG PO TABS
40.0000 mg | ORAL_TABLET | Freq: Every day | ORAL | Status: AC
  Administered 2023-05-08 – 2023-05-11 (×4): 40 mg via ORAL
  Filled 2023-05-07 (×4): qty 2

## 2023-05-07 MED ORDER — POLYETHYLENE GLYCOL 3350 17 G PO PACK
17.0000 g | PACK | Freq: Every day | ORAL | Status: DC | PRN
  Administered 2023-05-10 – 2023-05-11 (×2): 17 g via ORAL
  Filled 2023-05-07 (×2): qty 1

## 2023-05-07 MED ORDER — ACETAMINOPHEN 325 MG PO TABS
650.0000 mg | ORAL_TABLET | Freq: Four times a day (QID) | ORAL | Status: DC | PRN
  Administered 2023-05-07 – 2023-05-15 (×3): 650 mg via ORAL
  Filled 2023-05-07 (×3): qty 2

## 2023-05-07 MED ORDER — LEVOTHYROXINE SODIUM 50 MCG PO TABS
50.0000 ug | ORAL_TABLET | Freq: Every day | ORAL | Status: DC
  Administered 2023-05-08 – 2023-05-16 (×7): 50 ug via ORAL
  Filled 2023-05-07 (×9): qty 1

## 2023-05-07 MED ORDER — SODIUM CHLORIDE 0.9% FLUSH
3.0000 mL | Freq: Two times a day (BID) | INTRAVENOUS | Status: DC
  Administered 2023-05-07 – 2023-05-16 (×15): 3 mL via INTRAVENOUS

## 2023-05-07 MED ORDER — IPRATROPIUM-ALBUTEROL 0.5-2.5 (3) MG/3ML IN SOLN
3.0000 mL | Freq: Four times a day (QID) | RESPIRATORY_TRACT | Status: DC
  Administered 2023-05-07 – 2023-05-08 (×5): 3 mL via RESPIRATORY_TRACT
  Filled 2023-05-07 (×5): qty 3

## 2023-05-07 MED ORDER — ACETAMINOPHEN 650 MG RE SUPP: 650.0000 mg | Freq: Four times a day (QID) | RECTAL | Status: DC | PRN

## 2023-05-07 MED ORDER — IPRATROPIUM-ALBUTEROL 0.5-2.5 (3) MG/3ML IN SOLN
3.0000 mL | Freq: Once | RESPIRATORY_TRACT | Status: AC
  Administered 2023-05-07: 3 mL via RESPIRATORY_TRACT
  Filled 2023-05-07: qty 3

## 2023-05-07 MED ORDER — FUROSEMIDE 10 MG/ML IJ SOLN
40.0000 mg | Freq: Once | INTRAMUSCULAR | Status: AC
  Administered 2023-05-07: 40 mg via INTRAVENOUS
  Filled 2023-05-07: qty 4

## 2023-05-07 MED ORDER — PANTOPRAZOLE SODIUM 40 MG PO TBEC
40.0000 mg | DELAYED_RELEASE_TABLET | Freq: Every day | ORAL | Status: DC
  Administered 2023-05-08 – 2023-05-16 (×8): 40 mg via ORAL
  Filled 2023-05-07 (×9): qty 1

## 2023-05-07 NOTE — ED Provider Notes (Signed)
[facility] Provider Note    Event Date/Time   First MD Initiated Contact with Patient 05/07/23 1241     (approximate)   History   Weakness and Shortness of Breath   HPI  [name] is a 89 y.o. female past medical history of COPD, pacemaker placement, peripheral vascular disease, hypertension who presents because of dyspnea, decreased ability to ambulate and urine incontinence.  Patient is accompanied by her son who provides most of the history.  Over the last several days she has had some shortness of breath.  Today appeared to be very wheezy.  She has also had some cough.  Has had urinary incontinence over the last several days which is not normal for her.  Today was also not able to ambulate.  Typically would walk with a walker.  She has had some increased confusion off of her baseline.  Patient tells me that she feels like her balance is off.  Denies any dizziness or lightheadedness.  Denies any focal numbness tingling weakness just feels like her legs cannot move.  Patient denies shortness of breath chest pain.  Does endorse cough.  Denies abdominal pain nausea vomiting diarrhea or urinary symptoms.   Patient was seen by her primary care doctor on 5/21 where is noted that she has ongoing ataxia and is high risk for falls.  Blood pressure was still elevated and thus benazepril was added on.      Past Medical History:  Diagnosis Date   Anemia    B12 deficiency 10/09/2019   GERD (gastroesophageal reflux disease)    Hypertension    Neuropathy    Peripheral vascular disease (HCC)    possible neuropathies in lower extremeties   PONV (postoperative nausea and vomiting)    happens sometimes but better with pre med of zofran   Presence of permanent cardiac pacemaker    Syncope     Patient Active Problem List   Diagnosis Date Noted   AKI (acute kidney injury) (HCC) 08/25/2022   Severe sepsis (HCC) 08/25/2022   AMS (altered mental status) 08/25/2022    UTI (urinary tract infection) 08/25/2022   Mild cognitive impairment of uncertain or unknown etiology 04/15/2022   Presence of cardiac pacemaker 12/12/2021   Generalized weakness    Acute metabolic encephalopathy 07/23/2021   Hyponatremia 07/23/2021   Septic olecranon bursitis of left elbow 03/27/2018   Lymphedema 10/11/2017   Chronic venous insufficiency 10/11/2017   Leg pain 10/11/2017   Leg swelling 10/11/2017   COPD (chronic obstructive pulmonary disease) (HCC) 10/11/2017   Methotrexate, long term, current use 02/22/2017   Gastro-esophageal reflux disease without esophagitis 11/26/2015   Anemia 06/11/2014   Essential (primary) hypertension 06/11/2014   History of spinal surgery 12/20/2011   Rheumatoid arthritis (HCC) 05/11/2011     Physical Exam  Triage Vital Signs: ED Triage Vitals  Enc Vitals Group     BP 05/07/23 1232 (!) 138/117     Pulse Rate 05/07/23 1232 (!) 107     Resp 05/07/23 1232 19     Temp 05/07/23 1232 98 F (36.7 C)     Temp src --      SpO2 05/07/23 1232 93 %     Weight --      Height --      Head Circumference --      Peak Flow --      Pain Score 05/07/23 1231 4     Pain Loc --  Pain Edu? --      Excl. in GC? --     Most recent vital signs: Vitals:   05/07/23 1232  BP: (!) 138/117  Pulse: (!) 107  Resp: 19  Temp: 98 F (36.7 C)  SpO2: 93%     General: Awake, no distress.  CV:  Good peripheral perfusion.  2+ pitting edema bilateral lower extremities, symmetric Resp:  Normal effort.  Mildly tachypneic, wet sounding cough, patient has expiratory wheezing somewhat decreased air movement Abd:  No distention.  Abdomen is soft and nontender Neuro:             Awake, Alert, Oriented x 3  Other:  Patient's face is symmetric, normal tongue excursion, 5 out of 5 strength in bilateral upper and lower extremities   ED Results / Procedures / Treatments  Labs (all labs ordered are listed, but only abnormal results are displayed) Labs  Reviewed  BASIC METABOLIC PANEL - Abnormal; Notable for the following components:      Result Value   Sodium 128 (*)    Chloride 94 (*)    Glucose, Bld 105 (*)    Calcium 8.6 (*)    All other components within normal limits  CBC - Abnormal; Notable for the following components:   RBC 3.59 (*)    HCT 34.7 (*)    All other components within normal limits  BRAIN NATRIURETIC PEPTIDE - Abnormal; Notable for the following components:   B Natriuretic Peptide 573.1 (*)    All other components within normal limits  SARS CORONAVIRUS 2 BY RT PCR  HEPATIC FUNCTION PANEL  URINALYSIS, ROUTINE W REFLEX MICROSCOPIC  TROPONIN I (HIGH SENSITIVITY)     EKG  I reviewed and interpreted the EKG which shows atrial sensed ventricularly paced rhythm with large voltage T waves but a appear similar to prior no Sgarbossa criteria to suggest ischemia   RADIOLOGY I reviewed and interpreted the CXR which does not show any acute cardiopulmonary process   PROCEDURES:  Critical Care performed: No  Procedures  The patient is on the cardiac monitor to evaluate for evidence of arrhythmia and/or significant heart rate changes.   MEDICATIONS ORDERED IN ED: Medications  furosemide (LASIX) injection 40 mg (has no administration in time range)  ipratropium-albuterol (DUONEB) 0.5-2.5 (3) MG/3ML nebulizer solution 3 mL (3 mLs Nebulization Given 05/07/23 1344)  predniSONE (DELTASONE) tablet 60 mg (60 mg Oral Given 05/07/23 1356)     IMPRESSION / MDM / ASSESSMENT AND PLAN / ED COURSE  I reviewed the triage vital signs and the nursing notes.                              Patient's presentation is most consistent with acute complicated illness / injury requiring diagnostic workup.  Differential diagnosis includes, but is not limited to, COPD exacerbation, pneumonia, viral illness, urinary tract infection, CVA, intracranial hemorrhage, CHF  The patient is a 89 year old female who presents today for several  issues.  Her son notes that she has been more short of breath over the last several days has also been slightly more confused not been able to ambulate has had urinary incontinence.  She is typically able to walk with a walker but was very unsteady a and yesterday had a fall but her caregiver did not catch her she did not hit her head.  Apparently looked quite short of breath today earlier and was wheezing significantly when she was  in bed.  Has not had any GI symptoms or fevers.  Does have new urinary incontinence.  She did see her primary doctor several days ago who noted chronic ataxia that is unchanged.  Was started on new blood pressure medication.  Urinalysis was done at that time which was not consistent with UTI.  Patient is mildly tachycardic and hypertensive on arrival.  Sats when I am in the room are normal.  She is somewhat wheezy and has a wet sounding cough.  Also has significant pitting edema in the bilateral lower extremities.  Neurologic exam is nonfocal and she is alert and oriented x 3.  Abdominal exam is benign.  Plan to obtain chest x-ray and basic labs.  EKG shows a sensed ventricular paced rhythm.  Will give a DuoNeb and prednisone as I do see she has a history of COPD which would account for her wheezing.  Will also send a BNP to evaluate for CHF as she does have peripheral edema which apparently is normal for her.  Chest x-ray is clear no pulmonary edema.  BNP is mildly elevated though at 500 and with her peripheral edema question whether she has some CHF.  BMP is notable for mild hyponatremia with sodium 128.  Question whether this could be hypervolemic hyponatremia.  I did attempt to ambulate the patient and she was very unsteady and became very dyspneic with O2 sats in the high 80s.  At this point I do feel that she will need admission.  Still unclear whether this is COPD versus cardiac wheeze.  Given the chest x-ray is clear did treat with prednisone and DuoNeb.      FINAL  CLINICAL IMPRESSION(S) / ED DIAGNOSES   Final diagnoses:  Dyspnea on exertion     Rx / DC Orders   ED Discharge Orders     None        Note:  This document was prepared using Dragon voice recognition software and may include unintentional dictation errors.   Georga Hacking, MD 05/07/23 [phone]

## 2023-05-07 NOTE — H&P (Addendum)
History and Physical    Patient: [name] ZOX:[id number] DOB: 20-Nov-1930 DOA: 05/07/2023 DOS: the patient was seen and examined on 05/07/2023 PCP: Marguarite Arbour, MD  Patient coming from: Home  Chief Complaint:  Chief Complaint  Patient presents with   Weakness   Shortness of Breath   HPI: [name] is a 89 y.o. 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  History predominantly obtained through chart review due to patient's altered mental status.  Patient tells me that she has a cough that started today and she has been wheezing but is unsure for how long.  She denies any fevers, chills, nausea, vomiting, chest pain.  Per chart review, over the last few days, patient's son has noticed that [name] has been having increasing shortness of breath.  Today, he noted that she was wheezing a lot and had a cough.  Over the last couple days she has had some urinary incontinence while coughing.  Today, he is also noted increased generalized weakness and confusion compared to baseline, stating she can usually walk with a walker but cannot do so today.  ED course: On arrival to the ED, patient was hypertensive at 138/117 with heart rate of 97.  She was saturating at 93% on room air, but desaturated to 88% on ambulation.  She was afebrile at 98.  Initial workup notable for WBC of 7.4, sodium 128, chloride 94, bicarb 23, glucose 105, creatinine 0.83 with GFR above 60.  BNP elevated at 573.  Chest x-ray was obtained with no active cardiopulmonary disease.  CT of the head is pending.  COVID-19 PCR negative.  Patient started on prednisone, DuoNebs and Lasix.  [facility] contacted for admission.  Review of Systems: As mentioned in the history of present illness. All other  systems reviewed and are negative.  Past Medical History:  Diagnosis Date   Anemia    B12 deficiency 10/09/2019   GERD (gastroesophageal reflux disease)    Hypertension    Neuropathy    Peripheral vascular disease (HCC)    possible neuropathies in lower extremeties   PONV (postoperative nausea and vomiting)    happens sometimes but better with pre med of zofran   Presence of permanent cardiac pacemaker    Syncope    Past Surgical History:  Procedure Laterality Date   ABDOMINAL HYSTERECTOMY  1969   APPENDECTOMY  1969   with hysterectomy   BACK SURGERY  2001   rods in back. surgery on back x 3   COLONOSCOPY     I & D EXTREMITY Left 03/27/2018   Procedure: IRRIGATION AND DEBRIDEMENT LEFT ELBOW / OLECRANON BURSA;  Surgeon: Christena Flake, MD;  Location: [facility] OR[facility];  Service: Orthopedics;  Laterality: Left;   I & D EXTREMITY Left 06/05/2018   Procedure: IRRIGATION AND DEBRIDEMENT EXTREMITY;  Surgeon: Christena Flake, MD;  Location: [facility] OR[facility];  Service: Orthopedics;  Laterality: Left;   INSERT / REPLACE / REMOVE PACEMAKER  2004   JOINT REPLACEMENT Left 2004   partial knee replacement   OLECRANON BURSECTOMY Left 03/27/2018   Procedure: LEFT OLECRANON BURSA;  Surgeon: Christena Flake, MD;  Location: [facility] OR[facility];  Service: Orthopedics;  Laterality: Left;   PACEMAKER INSERTION  2014   dual chamber for complete heart block   Social History:  reports that  she has never smoked. She has never used smokeless tobacco. She reports current alcohol use of about 7.0 standard drinks of alcohol per week. She reports that she does not use drugs.  Allergies  Allergen Reactions   Codeine Nausea And Vomiting   Doxycycline Other (See Comments)    Flu-like s/s   Morphine Nausea And Vomiting and Anxiety    Hallucinations and anxiety   Tramadol Nausea And Vomiting and Other (See Comments)    Causes poor balance   Anakinra    Celecoxib    Hydroxychloroquine     Other reaction(s): Unknown   Adhesive [Tape] Rash    Amitriptyline Other (See Comments)    Caused nervousness. Patient states she has never taken this.   Ciprofloxacin Diarrhea   Clarithromycin Nausea Only    Flu like symptoms   Erythromycin Rash   Itraconazole Other (See Comments)    Elevated liver enzymes   Nsaids Nausea Only    Unknown   Penicillins Rash    Hives also. Doctor told her to never ever take pcn unless it is her only option   Zoloft [Sertraline Hcl] Other (See Comments)    Patient unaware of any reaction. Probably upset stomach    Family History  Problem Relation Age of Onset   Breast cancer Mother 76    Prior to Admission medications   Medication Sig Start Date End Date Taking? Authorizing Provider  acetaminophen (TYLENOL) 500 MG tablet Take 250 mg by mouth 2 (two) times daily.    [provider]  albuterol (VENTOLIN HFA) 108 (90 Base) MCG/ACT inhaler Inhale 2 puffs into the lungs every 6 (six) hours as needed. 08/20/22   [provider]  ALPRAZolam Prudy Feeler) 0.25 MG tablet Take 1 tablet (0.25 mg total) by mouth 2 (two) times daily as needed for anxiety. 07/27/21   Enedina Finner, MD  amLODipine (NORVASC) 5 MG tablet Take 1 tablet (5 mg total) by mouth daily. 07/27/21   Enedina Finner, MD  Ascorbic Acid (VITAMIN C) 1000 MG tablet Take 1,000 mg by mouth daily.    [provider]  Calcium Carbonate-Vitamin D (CALCIUM-D) 600-400 MG-UNIT TABS Take 1 tablet by mouth 2 (two) times daily.    [provider]  Calcium Polycarbophil (FIBER-CAPS PO) Take 1 capsule by mouth at bedtime.     [provider]  Cholecalciferol (VITAMIN D-3) 5000 units TABS Take 5,000 Units by mouth daily.    [provider]  cyanocobalamin (,VITAMIN B-12,) 1000 MCG/ML injection Inject 1,000 mcg into the muscle every 30 (thirty) days.    [provider]  estradiol (ESTRACE) 0.1 MG/GM vaginal cream Place 1 Applicatorful vaginally at bedtime as needed (for vaginal discomfort).    [provider]   folic acid (FOLVITE) 1 MG tablet Take 1 mg by mouth daily.    [provider]  Misc Natural Products (CALMME PO) Take 5 mLs by mouth at bedtime. Magnesium 1 tsp at night (dissolved in 4 oz. Water)    [provider]  Multiple Vitamin (MULTIVITAMIN) tablet Take 1 tablet by mouth daily.    [provider]  omeprazole (PRILOSEC) 40 MG capsule Take 40 mg by mouth daily before breakfast.     [provider]  ondansetron (ZOFRAN) 4 MG tablet Take 4 mg by mouth every 8 (eight) hours as needed for nausea or vomiting.    [provider]  Probiotic Product (PHILLIPS COLON HEALTH PO) Take 1 tablet by mouth daily.    [provider]  propranolol ER (INDERAL LA) 60 MG 24 hr capsule Take 60 mg by mouth daily. 07/05/21   [provider]    Physical Exam: Vitals:   05/07/23 1500 05/07/23 1530 05/07/23 1615 05/07/23 1650  BP:    (!) 162/70  Pulse: 89 89 81 84  Resp: 20 16 (!) 22 16  Temp:    99.1 F (37.3 C)  SpO2: 99% 97% 96% 95%   Physical Exam Vitals and nursing note reviewed.  Constitutional:      General: She is not in acute distress. HENT:     Head: Normocephalic and atraumatic.  Cardiovascular:     Rate and Rhythm: Normal rate and regular rhythm.     Heart sounds: Murmur (Decrescendo systolic murmur) heard.  Pulmonary:     Effort: Pulmonary effort is normal. Tachypnea present. No accessory muscle usage or respiratory distress.     Breath sounds: Decreased breath sounds (Predominantly lower fields) present. No wheezing, rhonchi or rales.  Musculoskeletal:     Right lower leg: 1+ Pitting Edema present.     Left lower leg: 1+ Pitting Edema present.  Skin:    General: Skin is warm and dry.     Findings: Bruising (Bruising in various stages of healing of bilateral upper and lower extremities) present. No erythema or rash.  Neurological:     Mental Status: She is alert.     Comments:  Patient is alert and oriented to person and  place, but not time and is unclear on the details of her situation.  Able to follow commands but easily distractible 4/5 strength of bilateral upper extremities 3/5 strength of bilateral lower extremities No dysarthria or facial asymmetry  Psychiatric:        Mood and Affect: Mood normal.        Behavior: Behavior normal.    Data Reviewed: CBC with WBC 7.4, hemoglobin 12.0 and platelets of 160 CMP with sodium 128, potassium 4.1, chloride 94, bicarb 23, glucose 105, BUN 14, creatinine 0.83, GFR above 60.  AST 38 and ALT 22. COVID-19 PCR negative  EKG personally reviewed.  Atrial sensing with ventricular pacing.  Rate of 94.   CT CHEST WO CONTRAST  Result Date: 05/07/2023 CLINICAL DATA:  Respiratory illness.  Shortness of breath. EXAM: CT CHEST WITHOUT CONTRAST TECHNIQUE: Multidetector CT imaging of the chest was performed following the standard protocol without IV contrast. RADIATION DOSE REDUCTION: This exam was performed according to the departmental dose-optimization program which includes automated exposure control, adjustment of the mA and/or kV according to patient size and/or use of iterative reconstruction technique. COMPARISON:  Chest radiograph performed earlier on the same date. FINDINGS: Cardiovascular: Heart is normal in size. Prominent coronary artery atherosclerotic calcifications. No pericardial effusion. Aorta is normal in caliber with atherosclerotic calcification of the aortic arch and a tortuous course. Pacemaker leads in the right atrium and right ventricle. Mediastinum/Nodes: Subcentimeter right paratracheal and prevascular lymph nodes, likely reactive. Thyroid, trachea and esophagus demonstrate no significant abnormality. Lungs/Pleura: Bibasilar dependent atelectasis. Mild centrilobular emphysematous changes. Lungs are otherwise clear. No pleural effusion or pneumothorax. Upper Abdomen: Cholelithiasis without evidence of acute cholecystitis. Musculoskeletal: Osteopenia and  moderate kyphosis. Posterior spinal fusion at thoracolumbar junction. Partially imaged hardware of the lower cervical spine. IMPRESSION: 1. Bibasilar dependent atelectasis. Lungs are otherwise clear. 2. Prominent coronary artery atherosclerotic calcifications. 3. Cholelithiasis without evidence of acute cholecystitis. 4. Osteopenia and moderate kyphosis. Posterior spinal fusion at thoracolumbar junction. 5. Aortic atherosclerosis. 6. Emphysema. Aortic Atherosclerosis (ICD10-I70.0)  and Emphysema (ICD10-J43.9). Electronically Signed   By: Larose Hires D.O.   On: 05/07/2023 17:17   CT HEAD WO CONTRAST ( )  Result Date: 05/07/2023 CLINICAL DATA:  Headache. EXAM: CT HEAD WITHOUT CONTRAST TECHNIQUE: Contiguous axial images were obtained from the base of the skull through the vertex without intravenous contrast. RADIATION DOSE REDUCTION: This exam was performed according to the departmental dose-optimization program which includes automated exposure control, adjustment of the mA and/or kV according to patient size and/or use of iterative reconstruction technique. COMPARISON:  CT head dated November 23, 2022. FINDINGS: Brain: No evidence of acute infarction, hemorrhage, hydrocephalus, extra-axial collection or mass lesion/mass effect. Stable moderate atrophy and chronic microvascular ischemic changes. Vascular: Calcified atherosclerosis at the skull base. No hyperdense vessel. Skull: Normal. Negative for fracture or focal lesion. Sinuses/Orbits: No acute finding. Chronic complete opacification of the left sphenoid sinus. Other: None. IMPRESSION: 1. No acute intracranial abnormality. Stable moderate atrophy and chronic microvascular ischemic changes. Electronically Signed   By: Obie Dredge M.D.   On: 05/07/2023 15:42   DG Chest 2 View  Result Date: 05/07/2023 CLINICAL DATA:  Worsening shortness of breath and weakness over the past few days. EXAM: CHEST - 2 VIEW COMPARISON:  Chest x-ray dated November 23, 2022.  FINDINGS: Unchanged left chest wall pacemaker. The heart size and mediastinal contours are within normal limits. Normal pulmonary vascularity. No focal consolidation, pleural effusion, or pneumothorax. No acute osseous abnormality. Prior T12-L5 posterior fusion. IMPRESSION: No active cardiopulmonary disease. Electronically Signed   By: Obie Dredge M.D.   On: 05/07/2023 13:18    Results are pending, will review when available.  Assessment and Plan:  * Acute hypoxic respiratory failure (HCC) Patient presenting with several days of shortness of breath, wheezing and cough.  BNP is elevated at 573 and she does have some lower extremity edema, however does not appear grossly hypervolemic.  History of aortic stenosis. History of COPD as well although no wheezing on examination. Chest x-ray was negative, however will obtain a CT of the chest to evaluate further.   - Continue supplemental oxygen with exertion to maintain oxygen saturation above 88% - Wean as tolerated - Continue prednisone 40 mg to complete a 5-day course - RVP and procalcitonin pending - DuoNebs every 6 hours - S/p Lasix 40 mg IV once - Daily weights - Strict in and out - Echocardiogram ordered  AMS (altered mental status) Per chart review, patient has history of mild cognitive impairment however her son noted increased confusion today.  On exam, she does have some difficulty answering questions regarding recent history.  She seems very distractible.  No dysarthria or facial asymmetry.  No focal weakness to suggest CVA.  Mild hyponatremia noted, however would not expect this to be contributing  - Delirium precautions - Recheck BMP in the morning - Urinalysis pending  Hyponatremia Previous history of chronic hyponatremia as low as 126.  Most recently 132 5 days prior.  TSH within normal limits approximately 5 days ago.  She has received Lasix for hypervolemia, which may be contributing  - Check urine sodium and osmolality -  Recheck BMP in a.m.  Essential (primary) hypertension - Resume home propranolol  Generalized weakness On examination, patient has diffuse weakness that is worse in the bilateral lower extremities.  Per recent visit with PCP, this is chronic.  -PT/OT  Rheumatoid arthritis (HCC) No longer on DMARDs.  Not reporting any pain at this time.  Hypothyroidism - Continue home Synthroid  Advance Care  Planning:   Code Status: Full Code full code as no family at bedside and patient is unable to make decisions given AMS.  Consults: None  Family Communication: Patient's son stepped out of the room, will update when he becomes available  Severity of Illness: The appropriate patient status for this patient is INPATIENT. Inpatient status is judged to be reasonable and necessary in order to provide the required intensity of service to ensure the patient's safety. The patient's presenting symptoms, physical exam findings, and initial radiographic and laboratory data in the context of their chronic comorbidities is felt to place them at high risk for further clinical deterioration. Furthermore, it is not anticipated that the patient will be medically stable for discharge from the hospital within 2 midnights of admission.   * I certify that at the point of admission it is my clinical judgment that the patient will require inpatient hospital care spanning beyond 2 midnights from the point of admission due to high intensity of service, high risk for further deterioration and high frequency of surveillance required.*  Author: Verdene Lennert, MD 05/07/2023 6:23 PM  For on call review [email or web address].

## 2023-05-07 NOTE — Assessment & Plan Note (Addendum)
Per chart review, patient has history of mild cognitive impairment however her son noted increased confusion today.  On exam, she does have some difficulty answering questions regarding recent history.  She seems very distractible.  No dysarthria or facial asymmetry.  No focal weakness to suggest CVA.  Mild hyponatremia noted, however would not expect this to be contributing  - Delirium precautions - Recheck BMP in the morning - Urinalysis pending

## 2023-05-07 NOTE — ED Notes (Signed)
Advised nurse that patient has ready bed 

## 2023-05-07 NOTE — Assessment & Plan Note (Signed)
Patient presenting with several days of shortness of breath, wheezing and cough.  BNP is elevated at 573 and she does have some lower extremity edema, however does not appear grossly hypervolemic.  History of aortic stenosis. History of COPD as well although no wheezing on examination. Chest x-ray was negative, however will obtain a CT of the chest to evaluate further.   - Continue supplemental oxygen with exertion to maintain oxygen saturation above 88% - Wean as tolerated - Continue prednisone 40 mg to complete a 5-day course - RVP and procalcitonin pending - DuoNebs every 6 hours - S/p Lasix 40 mg IV once - Daily weights - Strict in and out - Echocardiogram ordered

## 2023-05-07 NOTE — Assessment & Plan Note (Signed)
No longer on DMARDs.  Not reporting any pain at this time.

## 2023-05-07 NOTE — ED Triage Notes (Signed)
Pt comes with c/o increased sob and weakness for few days. Pt states more sob when using her walker. Pt states headache.

## 2023-05-07 NOTE — Assessment & Plan Note (Signed)
On examination, patient has diffuse weakness that is worse in the bilateral lower extremities.  Per recent visit with PCP, this is chronic.  -PT/OT

## 2023-05-07 NOTE — Assessment & Plan Note (Signed)
-   Continue home Synthroid °

## 2023-05-07 NOTE — Assessment & Plan Note (Signed)
Previous history of chronic hyponatremia as low as 126.  Most recently 132 5 days prior.  TSH within normal limits approximately 5 days ago.  She has received Lasix for hypervolemia, which may be contributing  - Check urine sodium and osmolality - Recheck BMP in a.m.

## 2023-05-07 NOTE — Assessment & Plan Note (Addendum)
-   Resume home propranolol

## 2023-05-08 ENCOUNTER — Inpatient Hospital Stay
Admission: EM | Admit: 2023-05-08 | Discharge: 2023-05-08 | Disposition: A | Discharge status: Home / Self Care | Payer: Medicare Other | Source: Home / Self Care | Attending: Internal Medicine | Admitting: Internal Medicine

## 2023-05-08 ENCOUNTER — Inpatient Hospital Stay (HOSPITAL_COMMUNITY)
Admission: EM | Admit: 2023-05-08 | Discharge: 2023-05-08 | Disposition: A | Discharge status: Home / Self Care | Payer: Medicare Other | Source: Home / Self Care | Attending: Internal Medicine | Admitting: Internal Medicine

## 2023-05-08 DIAGNOSIS — J9601 Acute respiratory failure with hypoxia: Secondary | ICD-10-CM | POA: Diagnosis not present

## 2023-05-08 DIAGNOSIS — I5031 Acute diastolic (congestive) heart failure: Secondary | ICD-10-CM

## 2023-05-08 LAB — RESPIRATORY PANEL BY PCR

## 2023-05-08 LAB — BASIC METABOLIC PANEL
Anion gap: 11 (ref 5–15)
BUN: 22 mg/dL (ref 8–23)
CO2: 25 mmol/L (ref 22–32)
Calcium: 8.6 mg/dL — ABNORMAL LOW (ref 8.9–10.3)
Chloride: 93 mmol/L — ABNORMAL LOW (ref 98–111)
Creatinine, Ser: 1.03 mg/dL — ABNORMAL HIGH (ref 0.44–1.00)
GFR, Estimated: 52 mL/min — ABNORMAL LOW (ref >60)
Glucose, Bld: 105 mg/dL — ABNORMAL HIGH (ref 70–99)
Potassium: 3.9 mmol/L (ref 3.5–5.1)
Sodium: 129 mmol/L — ABNORMAL LOW (ref 135–145)

## 2023-05-08 LAB — PHOSPHORUS: Phosphorus: 4.5 mg/dL (ref 2.5–4.6)

## 2023-05-08 LAB — CBC
HCT: 37.3 % (ref 36.0–46.0)
Hemoglobin: 12.9 g/dL (ref 12.0–15.0)
MCH: 32.9 pg (ref 26.0–34.0)
MCHC: 34.6 g/dL (ref 30.0–36.0)
MCV: 95.2 fL (ref 80.0–100.0)
Platelets: 160 10*3/uL (ref 150–400)
RBC: 3.92 MIL/uL (ref 3.87–5.11)
RDW: 11.9 % (ref 11.5–15.5)
WBC: 9.1 10*3/uL (ref 4.0–10.5)
nRBC: 0 % (ref 0.0–0.2)

## 2023-05-08 LAB — MAGNESIUM: Magnesium: 2.1 mg/dL (ref 1.7–2.4)

## 2023-05-08 LAB — OSMOLALITY: Osmolality: 275 mOsm/kg (ref 275–295)

## 2023-05-08 MED ORDER — GUAIFENESIN-DM 100-10 MG/5ML PO SYRP
10.0000 mL | ORAL_SOLUTION | Freq: Four times a day (QID) | ORAL | Status: DC | PRN
  Administered 2023-05-08 – 2023-05-10 (×3): 10 mL via ORAL
  Filled 2023-05-08 (×3): qty 10

## 2023-05-08 MED ORDER — FLUTICASONE FUROATE-VILANTEROL 200-25 MCG/ACT IN AEPB
1.0000 | INHALATION_SPRAY | Freq: Every day | RESPIRATORY_TRACT | Status: DC
  Administered 2023-05-08 – 2023-05-16 (×8): 1 via RESPIRATORY_TRACT
  Filled 2023-05-08: qty 28

## 2023-05-08 MED ORDER — PERFLUTREN LIPID MICROSPHERE
1.0000 mL | INTRAVENOUS | Status: AC | PRN
  Administered 2023-05-08: 4 mL via INTRAVENOUS

## 2023-05-08 MED ORDER — SODIUM CHLORIDE 1 G PO TABS
1.0000 g | ORAL_TABLET | Freq: Three times a day (TID) | ORAL | Status: DC
  Administered 2023-05-08 – 2023-05-10 (×6): 1 g via ORAL
  Filled 2023-05-08 (×6): qty 1

## 2023-05-08 MED ORDER — IPRATROPIUM-ALBUTEROL 0.5-2.5 (3) MG/3ML IN SOLN
3.0000 mL | Freq: Three times a day (TID) | RESPIRATORY_TRACT | Status: DC
  Filled 2023-05-08: qty 3

## 2023-05-08 NOTE — TOC Progression Note (Signed)
Transition of Care [facility]) - Progression Note    Patient Details  Name: [name] MRN: [id number] Date of Birth: 09-26-1930  Transition of Care [facility]) CM/SW Contact  Allena Katz, LCSW Phone Number: 05/08/2023, 1:52 PM  Clinical Narrative:      CSW LVM with patients son to discuss SNF recommendation for patient as pt is not fully oriented at this time.       Expected Discharge Plan and Services                                               Social Determinants of Health (SDOH) Interventions SDOH Screenings   Food Insecurity: No Food Insecurity (05/07/2023)  Housing: Patient Declined (05/07/2023)  Transportation Needs: No Transportation Needs (05/07/2023)  Utilities: Not At Risk (05/07/2023)  Tobacco Use: Low Risk  (05/07/2023)    Readmission Risk Interventions    07/25/2021   10:49 AM  Readmission Risk Prevention Plan  Transportation Screening Complete  PCP or Specialist Appt within 5-7 Days Complete  Home Care Screening Complete  Medication Review (RN CM) Complete

## 2023-05-08 NOTE — Evaluation (Signed)
Occupational Therapy Evaluation Patient Details Name: [name] MRN: [id number] DOB: 1930-09-24 Today's Date: 05/08/2023   History of Present Illness [name] is a 89 y.o. 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   Clinical Impression   [name] was seen for OT evaluation this date. Prior to hospital admission, pt was able to mobilize household distances with a 4WW, and perform BADL management with intermittent assist for LB dressing and sponge bathing at baseline . Pt lives alone, but has multiple PCA's who assist her during the day and family who live nearby who also assist PRN. Pt presents to acute OT demonstrating impaired ADL performance and functional mobility 2/2 generalized weakness, decreased balance, decreased safety awareness, and decreased activity tolerance (See OT problem list for additional functional deficits). Pt currently requires MAX A to perform short functional transfers, MAX A for LB ADL management, at SET UP assist for UB ADL management including feeding and grooming.  Pt would benefit from skilled OT services to address noted impairments and functional limitations (see below for any additional details) in order to maximize safety and independence while minimizing falls risk and caregiver burden. Anticipate the need for ongoing therapy services upon acute hospital DC (<3 hours/day).       Recommendations for follow up therapy are one component of a multi-disciplinary discharge planning process, led by the attending physician.  Recommendations may be updated based on patient status, additional functional criteria and insurance authorization.   Assistance Recommended at  Discharge Frequent or constant Supervision/Assistance  Patient can return home with the following A lot of help with bathing/dressing/bathroom;A lot of help with walking and/or transfers;Assistance with cooking/housework;Help with stairs or ramp for entrance;Assist for transportation    Functional Status Assessment  Patient has had a recent decline in their functional status and demonstrates the ability to make significant improvements in function in a reasonable and predictable amount of time.  Equipment Recommendations  None recommended by OT    Recommendations for Other Services       Precautions / Restrictions Precautions Precautions: Fall Restrictions Weight Bearing Restrictions: No      Mobility Bed Mobility Overal bed mobility: Needs Assistance Bed Mobility: Supine to Sit     Supine to sit: Mod assist, HOB elevated     General bed mobility comments: MOD A for trunk elevation. Pt noted with difficulty sequencing & use of bed rails.    Transfers Overall transfer level: Needs assistance Equipment used: 1 person hand held assist, Rolling walker (2 wheels) Transfers: Sit to/from Stand, Bed to chair/wheelchair/BSC Sit to Stand: From elevated surface, Max assist   Squat pivot transfers: Max assist      Lateral/Scoot Transfers: Max assist General transfer comment: Attempted STS with RW, however pt is unable to come to full stand, endorses fear of falling. Pt still eager to get to chair is able to lateral scoot toward [facility] with MAX A and Squat pivot with MAX A to recliner. Anticipate +2 for more advanced functional mobility.      Balance Overall balance assessment: Needs assistance Sitting-balance support: Feet supported, Single extremity supported Sitting balance-Leahy Scale: Fair Sitting balance - Comments: steady static sitting, endorses need for back support with cues to weight shift during functional task.   Standing balance support: Reliant  on assistive device for  balance, Bilateral upper extremity supported Standing balance-Leahy Scale: Poor Standing balance comment: Unable to come to full stand                           ADL either performed or assessed with clinical judgement   ADL Overall ADL's : Needs assistance/impaired                                       General ADL Comments: Significantly functionally limited by generalized weakness, decreased activity tolerance, fear of falling, and baseline neuropathy/arthritis in BUE/BLE. Requires  MAX A to squat pivot transfer to recliner. MAX A to don bilat hospital socks while sitting at EOB. SET UP assist for meal tray items.     Vision Patient Visual Report: No change from baseline       Perception     Praxis      Pertinent Vitals/Pain Pain Assessment Pain Assessment: No/denies pain     Hand Dominance Right   Extremity/Trunk Assessment Upper Extremity Assessment Upper Extremity Assessment: Generalized weakness   Lower Extremity Assessment Lower Extremity Assessment: Generalized weakness       Communication Communication Communication: HOH   Cognition Arousal/Alertness: Awake/alert Behavior During Therapy: WFL for tasks assessed/performed Overall Cognitive Status: Difficult to assess                                 General Comments: Pt noted with difficutly accurately answering questiongs/following VCs. Did not have hearing aids in, as they were out of batteries but has them in room with her in charging case.     General Comments       Exercises Other Exercises Other Exercises: Pt eduated on role of OT in acute setting, safe use of AE/DME for ADL management, falls prevention strategies for home and hospital and DC recs.   Shoulder Instructions      Home Living Family/patient expects to be discharged to:: Private residence Living Arrangements: Alone Available Help at Discharge: Family;Personal care attendant;Available  PRN/intermittently Type of Home: House Home Access: Ramped entrance     Home Layout: Two level;Able to live on main level with bedroom/bathroom     Bathroom Shower/Tub: Sponge bathes at baseline;Tub/shower unit   Bathroom Toilet: Handicapped height     Home Equipment: Agricultural consultant (2 wheels);Shower seat;Grab bars - tub/shower;Grab bars - toilet;BSC/3in1   Additional Comments: Per caregiver at bedside, has caregiver assist 4-8 hours/day 7 days/wk; alone at night.      Prior Functioning/Environment Prior Level of Function : Needs assist;History of Falls (last six months)       Physical Assist : ADLs (physical);Mobility (physical) Mobility (physical): Transfers;Gait ADLs (physical): Bathing;Dressing;IADLs Mobility Comments: At baseline, supervision/mod indep with RW for mobility; Hx of neuropathy per son this makes her unstable on her feet. 2 near miss falls in last 6 months in which she was cought by a caregiver. ADLs Comments: Caregivers provide assistance with all ADLs/IADLs - cooking, cleaning, laundry, driving, and medication management. Pt washes up at the sink. Needs assist to don compression stockings        OT Problem List: Decreased strength;Decreased coordination;Impaired sensation;Decreased activity tolerance;Decreased safety awareness;Impaired balance (sitting and/or standing);Decreased knowledge of use of DME or AE;Impaired UE functional use      OT Treatment/Interventions:  Self-care/ADL training;Therapeutic exercise;Therapeutic activities;DME and/or AE instruction;Patient/family education;Balance training;Energy conservation    OT Goals(Current goals can be found in the care plan section) Acute Rehab OT Goals Patient Stated Goal: To go home OT Goal Formulation: With patient Time For Goal Achievement: 05/22/23 Potential to Achieve Goals: Good  OT Frequency: Min 2X/week    Co-evaluation              AM-PAC OT "6 Clicks" Daily Activity     Outcome  Measure Help from another person eating meals?: A Little Help from another person taking care of personal grooming?: A Little Help from another person toileting, which includes using toliet, bedpan, or urinal?: A Lot Help from another person bathing (including washing, rinsing, drying)?: A Lot Help from another person to put on and taking off regular upper body clothing?: A Little Help from another person to put on and taking off regular lower body clothing?: A Lot 6 Click Score: 15   End of Session Equipment Utilized During Treatment: Gait belt;Rolling walker (2 wheels)  Activity Tolerance: Patient tolerated treatment well Patient left: in chair;with call bell/phone within reach;with chair alarm set  OT Visit Diagnosis: Other abnormalities of gait and mobility (R26.89)                Time: 1610-9604 OT Time Calculation (min): 46 min Charges:  OT General Charges $OT Visit: 1 Visit OT Evaluation $OT Eval Moderate Complexity: 1 Mod OT Treatments $Self Care/Home Management : 23-37 mins  Rockney Ghee, M.S., OTR/L 05/08/23, 10:36 AM

## 2023-05-08 NOTE — Progress Notes (Signed)
  Echocardiogram 2D Echocardiogram has been performed. Definity IV ultrasound imaging agent used on this study.  [name] 05/08/2023, 2:45 PM

## 2023-05-08 NOTE — Progress Notes (Signed)
Refused labs at this time per lab tech.. Reported she wants to wait until her son arrives at 6 am

## 2023-05-08 NOTE — Progress Notes (Signed)
[facility] Progress Note  Patient: [name]    ZOX:[id number]  DOA: 05/07/2023     Date of Service: the patient was seen and examined on 05/08/2023  Chief Complaint  Patient presents with   Weakness   Shortness of Breath   Brief hospital course: [name] is a 89 y.o. 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  Patient was complaining of cough and wheezing, unsure for how long.  No any other symptoms.  Patient has peripheral neuropathy and ambulatory dysfunction as well. ED workup: Tachycardic heart rate 107, tachypneic respiratory rate 22, BP 162/70 saturating well on room air Hyponatremia sodium 128, blood glucose 105, calcium 8.6, BNP 573 elevated, troponin negative, procalcitonin negative, WBC within normal range, CT head negative for any acute findings, CXR: negative for pneumonia, CT chest:: Bibasilar dependent atelectasis. Lungs are otherwise clear.  COVID-negative, RVP pending [facility] hospitalist consulted for further management as below.  Assessment and Plan:  Acute respiratory distress secondary to influenza A viral infection COPD exacerbation due to influenza A viral infection RVP positive for influenza A Currently patient is saturating well on room air, use supplemental oxygen as needed Patient was on Advair at home which was switched to [facility] Ellipta inhaler during hospital stay Continue prednisone 40 mg p.o. daily for total 5 days S/p Lasix 40 mg IV one-time dose in the ED Continue DuoNeb every 6 hourly scheduled, transition to as needed after improvement Follow 2D echocardiogram  Isotonic hyponatremia, serum osmole at 275 WNL Most likely due to nutritional deficiency Started sodium chloride 1 g p.o. 3 times daily Sodium  128--129 Monitor sodium level daily  AMS (altered mental status) mild cognitive impairment and confusion was noticed on admission it could be secondary to influenza A viral infection and COPD exacerbation vs hyponatremia CT head negative for any acute findings Continue delirium precautions and supportive care.   Hypertension, continue propranolol 60 mg p.o. daily home dose Monitor BP and titrate medications accordingly  GERD, continue PPI Hypothyroid, continue Synthroid Rheumatoid arthritis (HCC) No longer on DMARDs.  Not reporting any pain at this time.  Generalized weakness On examination, patient has diffuse weakness that is worse in the bilateral lower extremities.  Per recent visit with PCP, this is chronic.  -PT/OT  Body mass index is 25.89 kg/m.  Interventions:     Diet: Regular diet DVT Prophylaxis: Subcutaneous Lovenox   Advance goals of care discussion: Full code  Family Communication: family was not present at bedside, at the time of interview.  The pt provided permission to discuss medical plan with the family. Opportunity was given to ask question and all questions were answered satisfactorily.  5/27 d/w patient's son over the phone.  Management plan discussed.  All question and concerns answered.  Disposition:  Pt is from Home, admitted with respiratory distress, COPD exacerbation, influenza A positive, ambulatory dysfunction, PT and OT eval pending, may need SNF placement, which precludes a safe discharge. Discharge to SNF, TBD after PT and OT eval, when bed will be available..  Subjective: No significant events overnight, patient presented with shortness of breath, feels improvement, patient was complaining of having peripheral neuropathy, and stating that she needs a lot of help at home but she is not getting better with the current PT at home.  Denies any other complaints.  Physical Exam: General: NAD, lying comfortably Appear in no distress, affect  appropriate Eyes: PERRLA ENT: Oral Mucosa Clear, moist  Neck: no JVD,  Cardiovascular: S1 and S2 Present, no Murmur,  Respiratory: Equal air entry bilaterally, no crackles, mild wheezing. Abdomen: Bowel Sound present, Soft and no tenderness,  Skin: no rashes Extremities: mild Pedal edema, no calf tenderness, chronic venous stasis pigmentation Neurologic: without any new focal findings Gait not checked due to patient safety concerns  Vitals:   05/07/23 2134 05/08/23 0005 05/08/23 0735 05/08/23 1133  BP: 118/65 (!) 156/82 139/68 130/68  Pulse: 74 78 95 92  Resp: 18 18 18 16   Temp:  98 F (36.7 C) 98 F (36.7 C) 98.1 F (36.7 C)  SpO2: 98% 96% 96% 97%  Weight:        Intake/Output Summary (Last 24 hours) at 05/08/2023 1229 Last data filed at 05/07/2023 2100 Gross per 24 hour  Intake --  Output 300 ml  Net -300 ml   Filed Weights   05/07/23 1700  Weight: 66.3 kg    Data Reviewed: I have personally reviewed and interpreted daily labs, tele strips, imagings as discussed above. I reviewed all nursing notes, pharmacy notes, vitals, pertinent old records I have discussed plan of care as described above with RN and patient/family.  CBC: Recent Labs  Lab 05/07/23 1320 05/08/23 0852  WBC 7.4 9.1  HGB 12.0 12.9  HCT 34.7* 37.3  MCV 96.7 95.2  PLT 160 160   Basic Metabolic Panel: Recent Labs  Lab 05/07/23 1320 05/08/23 0852  NA 128* 129*  K 4.1 3.9  CL 94* 93*  CO2 23 25  GLUCOSE 105* 105*  BUN 14 22  CREATININE 0.83 1.03*  CALCIUM 8.6* 8.6*  MG  --  2.1  PHOS  --  4.5    Studies: CT CHEST WO CONTRAST  Result Date: 05/07/2023 CLINICAL DATA:  Respiratory illness.  Shortness of breath. EXAM: CT CHEST WITHOUT CONTRAST TECHNIQUE: Multidetector CT imaging of the chest was performed following the standard protocol without IV contrast. RADIATION DOSE REDUCTION: This exam was performed according to the departmental dose-optimization program which includes automated  exposure control, adjustment of the mA and/or kV according to patient size and/or use of iterative reconstruction technique. COMPARISON:  Chest radiograph performed earlier on the same date. FINDINGS: Cardiovascular: Heart is normal in size. Prominent coronary artery atherosclerotic calcifications. No pericardial effusion. Aorta is normal in caliber with atherosclerotic calcification of the aortic arch and a tortuous course. Pacemaker leads in the right atrium and right ventricle. Mediastinum/Nodes: Subcentimeter right paratracheal and prevascular lymph nodes, likely reactive. Thyroid, trachea and esophagus demonstrate no significant abnormality. Lungs/Pleura: Bibasilar dependent atelectasis. Mild centrilobular emphysematous changes. Lungs are otherwise clear. No pleural effusion or pneumothorax. Upper Abdomen: Cholelithiasis without evidence of acute cholecystitis. Musculoskeletal: Osteopenia and moderate kyphosis. Posterior spinal fusion at thoracolumbar junction. Partially imaged hardware of the lower cervical spine. IMPRESSION: 1. Bibasilar dependent atelectasis. Lungs are otherwise clear. 2. Prominent coronary artery atherosclerotic calcifications. 3. Cholelithiasis without evidence of acute cholecystitis. 4. Osteopenia and moderate kyphosis. Posterior spinal fusion at thoracolumbar junction. 5. Aortic atherosclerosis. 6. Emphysema. Aortic Atherosclerosis (ICD10-I70.0) and Emphysema (ICD10-J43.9). Electronically Signed   By: Larose Hires D.O.   On: 05/07/2023 17:17   CT HEAD WO CONTRAST ( )  Result Date: 05/07/2023 CLINICAL DATA:  Headache. EXAM: CT HEAD WITHOUT CONTRAST TECHNIQUE: Contiguous axial images were obtained from the base of the skull through the vertex without intravenous contrast. RADIATION DOSE REDUCTION: This  exam was performed according to the departmental dose-optimization program which includes automated exposure control, adjustment of the mA and/or kV according to patient size and/or  use of iterative reconstruction technique. COMPARISON:  CT head dated November 23, 2022. FINDINGS: Brain: No evidence of acute infarction, hemorrhage, hydrocephalus, extra-axial collection or mass lesion/mass effect. Stable moderate atrophy and chronic microvascular ischemic changes. Vascular: Calcified atherosclerosis at the skull base. No hyperdense vessel. Skull: Normal. Negative for fracture or focal lesion. Sinuses/Orbits: No acute finding. Chronic complete opacification of the left sphenoid sinus. Other: None. IMPRESSION: 1. No acute intracranial abnormality. Stable moderate atrophy and chronic microvascular ischemic changes. Electronically Signed   By: Obie Dredge M.D.   On: 05/07/2023 15:42   DG Chest 2 View  Result Date: 05/07/2023 CLINICAL DATA:  Worsening shortness of breath and weakness over the past few days. EXAM: CHEST - 2 VIEW COMPARISON:  Chest x-ray dated November 23, 2022. FINDINGS: Unchanged left chest wall pacemaker. The heart size and mediastinal contours are within normal limits. Normal pulmonary vascularity. No focal consolidation, pleural effusion, or pneumothorax. No acute osseous abnormality. Prior T12-L5 posterior fusion. IMPRESSION: No active cardiopulmonary disease. Electronically Signed   By: Obie Dredge M.D.   On: 05/07/2023 13:18    Scheduled Meds:  enoxaparin (LOVENOX) injection  40 mg Subcutaneous Q24H   fluticasone furoate-vilanterol  1 puff Inhalation Daily   ipratropium-albuterol  3 mL Nebulization Q6H   levothyroxine  50 mcg Oral Q0600   pantoprazole  40 mg Oral Daily   predniSONE  40 mg Oral Q breakfast   propranolol ER  60 mg Oral Daily   sodium chloride flush  3 mL Intravenous Q12H   sodium chloride  1 g Oral TID WC   Continuous Infusions: PRN Meds: acetaminophen **OR** acetaminophen, guaiFENesin-dextromethorphan, ondansetron **OR** ondansetron (ZOFRAN) IV, polyethylene glycol  Time spent: 35 minutes  Author: Gillis Santa. MD [facility] 05/08/2023 12:29 PM  To reach On-call, see care teams to locate the attending and reach out to them via [email or web address]. If 7PM-7AM, please contact night-coverage If you still have difficulty reaching the attending provider, please page the [facility] (Director on Call) for [facility] on amion for assistance.

## 2023-05-08 NOTE — Evaluation (Signed)
Physical Therapy Evaluation Patient Details Name: [name] MRN: [id number] DOB: 30-Aug-1930 Today's Date: 05/08/2023  History of Present Illness  [name] is a 89 y.o. 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  MD assessment includes:  acute hypoxic respiratory failure, AMS, hyponatremia, and generalized weakness   Clinical Impression  Pt was pleasant and motivated to participate during the session and put forth good effort throughout. Pt required extensive multi-modal cues for proper sequencing during transfer training but presented with poor carryover of all commands and ultimately required +2 near total assist to come to standing.  Once in standing pt presented with posterior instability requiring physical assist to correct and was unable to advance either LE during multiple attempts.  Pt will benefit from continued PT services upon discharge to safely address deficits listed in patient problem list for decreased caregiver assistance and eventual return to PLOF.         Recommendations for follow up therapy are one component of a multi-disciplinary discharge planning process, led by the attending physician.  Recommendations may be updated based on patient status, additional functional criteria and insurance authorization.  Follow Up Recommendations Can patient physically be transported by private vehicle: No     Assistance Recommended at Discharge Frequent or constant Supervision/Assistance  Patient can return home with the following  Two people to help with walking and/or transfers;A lot of help with bathing/dressing/bathroom;Assistance with cooking/housework;Direct supervision/assist for  medications management;Assist for transportation;Help with stairs or ramp for entrance    Equipment Recommendations None recommended by PT  Recommendations for Other Services       Functional Status Assessment Patient has had a recent decline in their functional status and/or demonstrates limited ability to make significant improvements in function in a reasonable and predictable amount of time     Precautions / Restrictions Precautions Precautions: Fall Restrictions Weight Bearing Restrictions: No      Mobility  Bed Mobility               General bed mobility comments: NT, pt in recliner    Transfers Overall transfer level: Needs assistance Equipment used: Rolling walker (2 wheels) Transfers: Sit to/from Stand Sit to Stand: +2 physical assistance, Max assist           General transfer comment: Max multi-modal cuing for proper sequencing with very poor carryover; +2 max A to come to standing with posterior instability once in standing that required constant min to mod A to correct    Ambulation/Gait               General Gait Details: Pt unable to advance either LE  Stairs            Wheelchair Mobility    Modified Rankin (Stroke Patients Only)       Balance Overall balance assessment: Needs assistance, History of Falls Sitting-balance support: Feet supported, Single extremity supported Sitting balance-Leahy Scale: Fair     Standing balance support: Reliant on assistive device for balance, Bilateral upper extremity supported Standing balance-Leahy Scale: Poor Standing balance comment: Posterior lean/instability while in standing                             Pertinent Vitals/Pain Pain Assessment  Pain Assessment: No/denies pain    Home Living Family/patient expects to be discharged to:: Private residence Living Arrangements: Alone Available Help at Discharge: Family;Personal care attendant;Available PRN/intermittently Type  of Home: House Home Access: Ramped entrance       Home Layout: Two level;Able to live on main level with bedroom/bathroom Home Equipment: Rolling Walker (2 wheels);Shower seat;Grab bars - tub/shower;Grab bars - toilet;BSC/3in1;Wheelchair - manual Additional Comments: Per family at bedside, has caregiver assist 4-8 hours/day 7 days/wk; alone at night.    Prior Function Prior Level of Function : Needs assist;History of Falls (last six months)       Physical Assist : ADLs (physical);Mobility (physical) Mobility (physical): Transfers;Gait ADLs (physical): Bathing;Dressing;IADLs Mobility Comments: At baseline, supervision/mod indep with RW for mobility household distances, uses w/c for community access; Hx of neuropathy per son this makes her unstable on her feet. 2 falls in last month in which she was lowered to the floor by PCA. ADLs Comments: Caregivers provide assistance with all ADLs/IADLs - cooking, cleaning, laundry, driving, and medication management. Pt washes up at the sink. Needs assist to don compression stockings     Hand Dominance   Dominant Hand: Right    Extremity/Trunk Assessment   Upper Extremity Assessment Upper Extremity Assessment: Generalized weakness    Lower Extremity Assessment Lower Extremity Assessment: Generalized weakness       Communication   Communication: HOH  Cognition Arousal/Alertness: Awake/alert Behavior During Therapy: WFL for tasks assessed/performed Overall Cognitive Status: Difficult to assess                                          General Comments      Exercises     Assessment/Plan    PT Assessment Patient needs continued PT services  PT Problem List Decreased strength;Decreased activity tolerance;Decreased balance;Decreased mobility       PT Treatment Interventions DME instruction;Gait training;Functional mobility training;Therapeutic activities;Therapeutic exercise;Balance training;Patient/family  education    PT Goals (Current goals can be found in the Care Plan section)  Acute Rehab PT Goals Patient Stated Goal: To get stronger PT Goal Formulation: With patient Time For Goal Achievement: 05/21/23 Potential to Achieve Goals: Fair    Frequency Min 2X/week     Co-evaluation               AM-PAC PT "6 Clicks" Mobility  Outcome Measure Help needed turning from your back to your side while in a flat bed without using bedrails?: A Lot Help needed moving from lying on your back to sitting on the side of a flat bed without using bedrails?: A Lot Help needed moving to and from a bed to a chair (including a wheelchair)?: Total Help needed standing up from a chair using your arms (e.g., wheelchair or bedside chair)?: Total Help needed to walk in hospital room?: Total Help needed climbing 3-5 steps with a railing? : Total 6 Click Score: 8    End of Session Equipment Utilized During Treatment: Gait belt Activity Tolerance: Patient tolerated treatment well Patient left: in chair;with call bell/phone within reach;with chair alarm set;with family/visitor present Nurse Communication: Mobility status PT Visit Diagnosis: Unsteadiness on feet (R26.81);History of falling (Z91.81);Difficulty in walking, not elsewhere classified (R26.2);Muscle weakness (generalized) (M62.81)    Time: 1610-9604 PT Time Calculation (min) (ACUTE ONLY): 26 min   Charges:   PT Evaluation $PT Eval Moderate Complexity: 1 Mod  Ovidio Hanger PT, DPT 05/08/23, 11:58 AM

## 2023-05-09 DIAGNOSIS — J9601 Acute respiratory failure with hypoxia: Secondary | ICD-10-CM | POA: Diagnosis not present

## 2023-05-09 LAB — BASIC METABOLIC PANEL
Anion gap: 10 (ref 5–15)
BUN: 25 mg/dL — ABNORMAL HIGH (ref 8–23)
CO2: 28 mmol/L (ref 22–32)
Calcium: 8.8 mg/dL — ABNORMAL LOW (ref 8.9–10.3)
Chloride: 94 mmol/L — ABNORMAL LOW (ref 98–111)
Creatinine, Ser: 1 mg/dL (ref 0.44–1.00)
GFR, Estimated: 54 mL/min — ABNORMAL LOW (ref >60)
Glucose, Bld: 112 mg/dL — ABNORMAL HIGH (ref 70–99)
Potassium: 4.3 mmol/L (ref 3.5–5.1)
Sodium: 132 mmol/L — ABNORMAL LOW (ref 135–145)

## 2023-05-09 LAB — CBC
HCT: 38.5 % (ref 36.0–46.0)
Hemoglobin: 13 g/dL (ref 12.0–15.0)
MCH: 32.7 pg (ref 26.0–34.0)
MCHC: 33.8 g/dL (ref 30.0–36.0)
MCV: 97 fL (ref 80.0–100.0)
Platelets: 161 10*3/uL (ref 150–400)
RBC: 3.97 MIL/uL (ref 3.87–5.11)
RDW: 11.6 % (ref 11.5–15.5)
WBC: 9.6 10*3/uL (ref 4.0–10.5)
nRBC: 0 % (ref 0.0–0.2)

## 2023-05-09 LAB — ECHOCARDIOGRAM COMPLETE
AR max vel: 1.33 cm2
AV Area VTI: 1.38 cm2
AV Area mean vel: 1.15 cm2
AV Mean grad: 7 mmHg
AV Peak grad: 13.7 mmHg
Ao pk vel: 1.85 m/s
Area-P 1/2: 2.79 cm2
MV VTI: 0.71 cm2
S' Lateral: 2.4 cm
Weight: 2338.64 oz

## 2023-05-09 LAB — MAGNESIUM: Magnesium: 2.2 mg/dL (ref 1.7–2.4)

## 2023-05-09 LAB — PHOSPHORUS: Phosphorus: 3.9 mg/dL (ref 2.5–4.6)

## 2023-05-09 MED ORDER — HYDRALAZINE HCL 50 MG PO TABS
50.0000 mg | ORAL_TABLET | Freq: Four times a day (QID) | ORAL | Status: DC | PRN
  Administered 2023-05-14: 50 mg via ORAL
  Filled 2023-05-09: qty 1

## 2023-05-09 MED ORDER — IPRATROPIUM-ALBUTEROL 0.5-2.5 (3) MG/3ML IN SOLN
3.0000 mL | Freq: Four times a day (QID) | RESPIRATORY_TRACT | Status: DC | PRN
  Administered 2023-05-09 – 2023-05-10 (×3): 3 mL via RESPIRATORY_TRACT
  Filled 2023-05-09 (×3): qty 3

## 2023-05-09 MED ORDER — HYDRALAZINE HCL 20 MG/ML IJ SOLN
10.0000 mg | Freq: Four times a day (QID) | INTRAMUSCULAR | Status: DC | PRN
  Filled 2023-05-09: qty 1

## 2023-05-09 MED ORDER — AMLODIPINE BESYLATE 5 MG PO TABS
5.0000 mg | ORAL_TABLET | Freq: Every day | ORAL | Status: DC
  Administered 2023-05-09 – 2023-05-15 (×6): 5 mg via ORAL
  Filled 2023-05-09 (×8): qty 1

## 2023-05-09 NOTE — Progress Notes (Signed)
[facility] Progress Note  Patient: [name]    ZOX:[id number]  DOA: 05/07/2023     Date of Service: the patient was seen and examined on 05/09/2023  Chief Complaint  Patient presents with   Weakness   Shortness of Breath   Brief hospital course: [name] is a 89 y.o. 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  Patient was complaining of cough and wheezing, unsure for how long.  No any other symptoms.  Patient has peripheral neuropathy and ambulatory dysfunction as well. ED workup: Tachycardic heart rate 107, tachypneic respiratory rate 22, BP 162/70 saturating well on room air Hyponatremia sodium 128, blood glucose 105, calcium 8.6, BNP 573 elevated, troponin negative, procalcitonin negative, WBC within normal range, CT head negative for any acute findings, CXR: negative for pneumonia, CT chest:: Bibasilar dependent atelectasis. Lungs are otherwise clear.  COVID-negative, RVP pending [facility] hospitalist consulted for further management as below.  Assessment and Plan:  # Acute respiratory distress secondary to influenza A viral infection # COPD exacerbation due to influenza A viral infection RVP positive for influenza A Currently patient is saturating well on room air, use supplemental oxygen as needed Patient was on Advair at home which was switched to [facility] Ellipta inhaler during hospital stay Continue prednisone 40 mg p.o. daily for total 5 days S/p Lasix 40 mg IV one-time dose in the ED Continue DuoNeb every 6 hourly scheduled, transition to as needed after improvement TTE LVEF 65 to 70%, no wall motion abnormality, no significant valvular abnormality.  Isotonic hyponatremia, serum osmole at 275 WNL Most likely due to nutritional  deficiency Started sodium chloride 1 g p.o. 3 times daily Sodium 128--129--132 Monitor sodium level daily  AMS (altered mental status) mild cognitive impairment and confusion was noticed on admission it could be secondary to influenza A viral infection and COPD exacerbation vs hyponatremia CT head negative for any acute findings Continue delirium precautions and supportive care.   Hypertension, continue propranolol 60 mg p.o. daily home dose Resumed amlodipine 5 mg p.o. daily home dose Use hydralazine as needed Monitor BP and titrate medications accordingly  GERD, continue PPI Hypothyroid, continue Synthroid Rheumatoid arthritis (HCC) No longer on DMARDs.  Not reporting any pain at this time.  Generalized weakness On examination, patient has diffuse weakness that is worse in the bilateral lower extremities.  Per recent visit with PCP, this is chronic.  -PT/OT  Body mass index is 25.89 kg/m.  Interventions:     Diet: Regular diet DVT Prophylaxis: Subcutaneous Lovenox   Advance goals of care discussion: Full code  Family Communication: family was not present at bedside, at the time of interview.  The pt provided permission to discuss medical plan with the family. Opportunity was given to ask question and all questions were answered satisfactorily.  5/27 d/w patient's son over the phone.  Management plan discussed.  All question and concerns answered.  Disposition:  Pt is from Home, admitted with respiratory distress, COPD exacerbation, influenza A positive, ambulatory dysfunction, pending SNF placement, which precludes a safe discharge. Discharge to SNF,  when bed will be available..  Subjective: No significant events overnight, patient denies any worsening of shortness of breath.  No chest pain or palpitations, denies any body ache.  No any concerns.  Physical Exam: General: NAD, lying comfortably Appear in  no distress, affect appropriate Eyes: PERRLA ENT: Oral Mucosa  Clear, moist  Neck: no JVD,  Cardiovascular: S1 and S2 Present, no Murmur,  Respiratory: Equal air entry bilaterally, no crackles, mild wheezing. Abdomen: Bowel Sound present, Soft and no tenderness,  Skin: no rashes Extremities: mild Pedal edema, no calf tenderness, chronic venous stasis pigmentation Neurologic: without any new focal findings Gait not checked due to patient safety concerns  Vitals:   05/08/23 1956 05/08/23 2333 05/09/23 0503 05/09/23 0831  BP: (!) 143/81 (!) 182/72 (!) 175/88 (!) 170/62  Pulse: 78 70 69 78  Resp: 19 18 18 20   Temp: 98.1 F (36.7 C) 98.3 F (36.8 C) 98 F (36.7 C) 97.6 F (36.4 C)  SpO2: 96% 99% 98% 100%  Weight:        Intake/Output Summary (Last 24 hours) at 05/09/2023 1442 Last data filed at 05/09/2023 1434 Gross per 24 hour  Intake 120 ml  Output --  Net 120 ml   Filed Weights   05/07/23 1700  Weight: 66.3 kg    Data Reviewed: I have personally reviewed and interpreted daily labs, tele strips, imagings as discussed above. I reviewed all nursing notes, pharmacy notes, vitals, pertinent old records I have discussed plan of care as described above with RN and patient/family.  CBC: Recent Labs  Lab 05/07/23 1320 05/08/23 0852 05/09/23 0543  WBC 7.4 9.1 9.6  HGB 12.0 12.9 13.0  HCT 34.7* 37.3 38.5  MCV 96.7 95.2 97.0  PLT 160 160 161   Basic Metabolic Panel: Recent Labs  Lab 05/07/23 1320 05/08/23 0852 05/09/23 0543  NA 128* 129* 132*  K 4.1 3.9 4.3  CL 94* 93* 94*  CO2 23 25 28   GLUCOSE 105* 105* 112*  BUN 14 22 25*  CREATININE 0.83 1.03* 1.00  CALCIUM 8.6* 8.6* 8.8*  MG  --  2.1 2.2  PHOS  --  4.5 3.9    Studies: No results found.  Scheduled Meds:  amLODipine  5 mg Oral Daily   enoxaparin (LOVENOX) injection  40 mg Subcutaneous Q24H   fluticasone furoate-vilanterol  1 puff Inhalation Daily   levothyroxine  50 mcg Oral Q0600   pantoprazole  40 mg Oral Daily   predniSONE  40 mg Oral Q breakfast    propranolol ER  60 mg Oral Daily   sodium chloride flush  3 mL Intravenous Q12H   sodium chloride  1 g Oral TID WC   Continuous Infusions: PRN Meds: acetaminophen **OR** acetaminophen, guaiFENesin-dextromethorphan, hydrALAZINE **OR** hydrALAZINE, ipratropium-albuterol, ondansetron **OR** ondansetron (ZOFRAN) IV, polyethylene glycol  Time spent: 35 minutes  Author: Gillis Santa. MD [facility] 05/09/2023 2:42 PM  To reach On-call, see care teams to locate the attending and reach out to them via [email or web address]. If 7PM-7AM, please contact night-coverage If you still have difficulty reaching the attending provider, please page the [facility] (Director on Call) for [facility] on amion for assistance.

## 2023-05-09 NOTE — TOC Progression Note (Addendum)
Transition of Care [facility]) - Progression Note    Patient Details  Name: [name] MRN: [id number] Date of Birth: September 12, 1930  [facility]) CM/SW Contact  Allena Katz, LCSW Phone Number: 05/09/2023, 9:19 AM  Clinical Narrative:     Second voicemail left for patients son regarding SNF.     2:43PM Third attempt to reach son regarding SNF placement.  Expected Discharge Plan and Services                                               Social Determinants of Health (SDOH) Interventions SDOH Screenings   Food Insecurity: No Food Insecurity (05/07/2023)  Housing: Patient Declined (05/07/2023)  Transportation Needs: No Transportation Needs (05/07/2023)  Utilities: Not At Risk (05/07/2023)  Tobacco Use: Low Risk  (05/07/2023)    Readmission Risk Interventions    07/25/2021   10:49 AM  Readmission Risk Prevention Plan  Transportation Screening Complete  PCP or Specialist Appt within 5-7 Days Complete  Home Care Screening Complete  Medication Review (RN CM) Complete

## 2023-05-09 NOTE — NC FL2 (Signed)
[place] MEDICAID FL2 LEVEL OF CARE FORM     IDENTIFICATION  Patient Name: [name] Birthdate: 1930-07-27 Sex: female Admission Date (Current Location): 05/07/2023  [facility] and [place] Number:  Chiropodist and Address:  [facility], [address] [place], [place] [address]      Provider Number: [id number]  Attending Physician Name and Address:  Gillis Santa, MD  Relative Name and Phone Number:       Current Level of Care: Hospital Recommended Level of Care: Skilled Nursing Facility Prior Approval Number:    Date Approved/Denied:   PASRR Number: [id number]  Discharge Plan: SNF    Current Diagnoses: Patient Active Problem List   Diagnosis Date Noted   Acute hypoxic respiratory failure (HCC) 05/07/2023   Hypothyroidism 05/02/2023   AKI (acute kidney injury) (HCC) 08/25/2022   Severe sepsis (HCC) 08/25/2022   AMS (altered mental status) 08/25/2022   UTI (urinary tract infection) 08/25/2022   Mild cognitive impairment of uncertain or unknown etiology 04/15/2022   Bursitis of hip 04/15/2022   Presence of cardiac pacemaker 12/12/2021   Perceptive hearing loss, both sides 12/12/2021   Generalized weakness    Acute metabolic encephalopathy 07/23/2021   Hyponatremia 07/23/2021   Age-related osteoporosis without current pathological fracture 04/19/2018   Septic olecranon bursitis of left elbow 03/27/2018   Lymphedema 10/11/2017   Chronic venous insufficiency 10/11/2017   Leg pain 10/11/2017   Leg swelling 10/11/2017   COPD (chronic obstructive pulmonary disease) (HCC) 10/11/2017   Cervical spondylosis with myelopathy 03/21/2017   Methotrexate, long term, current use 02/22/2017   Ataxia 10/04/2016   Restless leg syndrome 10/04/2016   Gastro-esophageal reflux disease without esophagitis 11/26/2015   Allergic rhinitis 11/26/2015   Cervical radiculitis 02/02/2015   Dysphagia, oropharyngeal phase 10/20/2014   Anemia 06/11/2014    Essential (primary) hypertension 06/11/2014   Atrioventricular block, complete (HCC) 11/12/2013   Left bundle-branch block, unspecified 11/13/2012   Cholelithiasis 05/21/2012   History of spinal surgery 12/20/2011   Anal fissure 12/20/2011   Rheumatoid arthritis (HCC) 05/11/2011    Orientation RESPIRATION BLADDER Height & Weight     Self, Place  Normal External catheter, Incontinent Weight: 146 lb 2.6 oz (66.3 kg) Height:     BEHAVIORAL SYMPTOMS/MOOD NEUROLOGICAL BOWEL NUTRITION STATUS      Incontinent Diet  AMBULATORY STATUS COMMUNICATION OF NEEDS Skin   Limited Assist Verbally Normal                       Personal Care Assistance Level of Assistance  Bathing, Feeding, Dressing Bathing Assistance: Maximum assistance Feeding assistance: Limited assistance Dressing Assistance: Maximum assistance     Functional Limitations Info  Sight, Speech, Hearing Sight Info: Impaired Hearing Info: Impaired Speech Info: Adequate    SPECIAL CARE FACTORS FREQUENCY  OT (By licensed OT), PT (By licensed PT)     PT Frequency: 5 times a week OT Frequency: 5 times a week            Contractures Contractures Info: Not present    Additional Factors Info  Allergies, Code Status, Isolation Precautions Code Status Info: FULL Allergies Info: Codeine  Doxycycline  Morphine  Tramadol  Anakinra  Celecoxib  Hydroxychloroquine  Adhesive (Tape)  Amitriptyline  Ciprofloxacin  Clarithromycin  Erythromycin  Itraconazole  Nsaids  Penicillins  Zoloft (Sertraline Hcl)     Isolation Precautions Info: Droplet precautions     Current Medications (05/09/2023):  This is the current hospital active  medication list Current Facility-Administered Medications  Medication Dose Route Frequency Provider Last Rate Last Admin   acetaminophen (TYLENOL) tablet 650 mg  650 mg Oral Q6H PRN Verdene Lennert, MD   650 mg at 05/08/23 1450   Or   acetaminophen (TYLENOL) suppository 650 mg  650 mg Rectal Q6H PRN  Verdene Lennert, MD       amLODipine (NORVASC) tablet 5 mg  5 mg Oral Daily Gillis Santa, MD   5 mg at 05/09/23 1021   enoxaparin (LOVENOX) injection 40 mg  40 mg Subcutaneous Q24H Verdene Lennert, MD   40 mg at 05/08/23 1733   fluticasone furoate-vilanterol (BREO ELLIPTA) 200-25 MCG/ACT 1 puff  1 puff Inhalation Daily Gillis Santa, MD   1 puff at 05/09/23 0815   guaiFENesin-dextromethorphan (ROBITUSSIN DM) 100-10 MG/5ML syrup 10 mL  10 mL Oral Q6H PRN Gillis Santa, MD   10 mL at 05/09/23 1023   hydrALAZINE (APRESOLINE) tablet 50 mg  50 mg Oral Q6H PRN Gillis Santa, MD       Or   hydrALAZINE (APRESOLINE) injection 10 mg  10 mg Intravenous Q6H PRN Gillis Santa, MD       ipratropium-albuterol (DUONEB) 0.5-2.5 (3) MG/3ML nebulizer solution 3 mL  3 mL Nebulization Q6H PRN Gillis Santa, MD   3 mL at 05/09/23 1211   levothyroxine (SYNTHROID) tablet 50 mcg  50 mcg Oral Q0600 Verdene Lennert, MD   50 mcg at 05/09/23 0543   ondansetron (ZOFRAN) tablet 4 mg  4 mg Oral Q6H PRN Verdene Lennert, MD       Or   ondansetron (ZOFRAN) injection 4 mg  4 mg Intravenous Q6H PRN Verdene Lennert, MD       pantoprazole (PROTONIX) EC tablet 40 mg  40 mg Oral Daily Verdene Lennert, MD   40 mg at 05/09/23 1610   polyethylene glycol (MIRALAX / GLYCOLAX) packet 17 g  17 g Oral Daily PRN Verdene Lennert, MD       predniSONE (DELTASONE) tablet 40 mg  40 mg Oral Q breakfast Verdene Lennert, MD   40 mg at 05/09/23 9604   propranolol ER (INDERAL LA) 24 hr capsule 60 mg  60 mg Oral Daily Verdene Lennert, MD   60 mg at 05/09/23 5409   sodium chloride flush (NS) 0.9 % injection 3 mL  3 mL Intravenous Q12H Verdene Lennert, MD   3 mL at 05/09/23 0816   sodium chloride tablet 1 g  1 g Oral TID WC Gillis Santa, MD   1 g at 05/09/23 1207     Discharge Medications: Please see discharge summary for a list of discharge medications.  Relevant Imaging Results:  Relevant Lab Results:   Additional Information SS  [id number]  Allena Katz, LCSW

## 2023-05-10 DIAGNOSIS — J9601 Acute respiratory failure with hypoxia: Secondary | ICD-10-CM | POA: Diagnosis not present

## 2023-05-10 LAB — URINALYSIS, W/ REFLEX TO CULTURE (INFECTION SUSPECTED)
Bilirubin Urine: NEGATIVE
Glucose, UA: NEGATIVE mg/dL
Ketones, ur: NEGATIVE mg/dL
Leukocytes,Ua: NEGATIVE
Nitrite: NEGATIVE
Protein, ur: NEGATIVE mg/dL
Specific Gravity, Urine: 1.009 (ref 1.005–1.030)
pH: 8 (ref 5.0–8.0)

## 2023-05-10 LAB — CBC
HCT: 39.7 % (ref 36.0–46.0)
Hemoglobin: 13.6 g/dL (ref 12.0–15.0)
MCH: 32.9 pg (ref 26.0–34.0)
MCHC: 34.3 g/dL (ref 30.0–36.0)
MCV: 95.9 fL (ref 80.0–100.0)
Platelets: 185 10*3/uL (ref 150–400)
RBC: 4.14 MIL/uL (ref 3.87–5.11)
RDW: 11.7 % (ref 11.5–15.5)
WBC: 10.4 10*3/uL (ref 4.0–10.5)
nRBC: 0 % (ref 0.0–0.2)

## 2023-05-10 LAB — BASIC METABOLIC PANEL
Anion gap: 9 (ref 5–15)
BUN: 23 mg/dL (ref 8–23)
CO2: 28 mmol/L (ref 22–32)
Calcium: 8.9 mg/dL (ref 8.9–10.3)
Chloride: 98 mmol/L (ref 98–111)
Creatinine, Ser: 0.9 mg/dL (ref 0.44–1.00)
GFR, Estimated: >60 mL/min (ref >60)
Glucose, Bld: 92 mg/dL (ref 70–99)
Potassium: 3.9 mmol/L (ref 3.5–5.1)
Sodium: 135 mmol/L (ref 135–145)

## 2023-05-10 LAB — MAGNESIUM: Magnesium: 2.1 mg/dL (ref 1.7–2.4)

## 2023-05-10 LAB — PHOSPHORUS: Phosphorus: 2.5 mg/dL (ref 2.5–4.6)

## 2023-05-10 MED ORDER — ZINC OXIDE 40 % EX OINT
TOPICAL_OINTMENT | CUTANEOUS | Status: DC | PRN
  Filled 2023-05-10: qty 113

## 2023-05-10 MED ORDER — GUAIFENESIN ER 600 MG PO TB12
600.0000 mg | ORAL_TABLET | Freq: Two times a day (BID) | ORAL | Status: DC
  Administered 2023-05-10 – 2023-05-16 (×12): 600 mg via ORAL
  Filled 2023-05-10 (×13): qty 1

## 2023-05-10 MED ORDER — TRAZODONE HCL 50 MG PO TABS
25.0000 mg | ORAL_TABLET | Freq: Every day | ORAL | Status: DC
  Administered 2023-05-10 – 2023-05-11 (×2): 25 mg via ORAL
  Filled 2023-05-10 (×2): qty 1

## 2023-05-10 MED ORDER — QUETIAPINE FUMARATE 25 MG PO TABS
25.0000 mg | ORAL_TABLET | Freq: Two times a day (BID) | ORAL | Status: DC
  Administered 2023-05-10 – 2023-05-11 (×3): 25 mg via ORAL
  Filled 2023-05-10 (×3): qty 1

## 2023-05-10 MED ORDER — HYDROCOD POLI-CHLORPHE POLI ER 10-8 MG/5ML PO SUER
5.0000 mL | Freq: Two times a day (BID) | ORAL | Status: DC | PRN
  Administered 2023-05-10: 5 mL via ORAL
  Filled 2023-05-10 (×2): qty 5

## 2023-05-10 NOTE — Progress Notes (Signed)
[facility] Progress Note  Patient: [name]    ZOX:[id number]  DOA: 05/07/2023     Date of Service: the patient was seen and examined on 05/10/2023  Chief Complaint  Patient presents with   Weakness   Shortness of Breath   Brief hospital course: APSARA ONAN is a 89 y.o. 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  Patient was complaining of cough and wheezing, unsure for how long.  No any other symptoms.  Patient has peripheral neuropathy and ambulatory dysfunction as well. ED workup: Tachycardic heart rate 107, tachypneic respiratory rate 22, BP 162/70 saturating well on room air Hyponatremia sodium 128, blood glucose 105, calcium 8.6, BNP 573 elevated, troponin negative, procalcitonin negative, WBC within normal range, CT head negative for any acute findings, CXR: negative for pneumonia, CT chest:: Bibasilar dependent atelectasis. Lungs are otherwise clear.  COVID-negative, RVP pending [facility] hospitalist consulted for further management as below.  Assessment and Plan:  # Acute respiratory distress secondary to influenza A viral infection # COPD exacerbation due to influenza A viral infection RVP positive for influenza A Currently patient is saturating well on room air, use supplemental oxygen as needed Patient was on Advair at home which was switched to [facility] Ellipta inhaler during hospital stay Continue prednisone 40 mg p.o. daily for total 5 days S/p Lasix 40 mg IV one-time dose in the ED Continue DuoNeb every 6 hourly scheduled, transition to as needed after improvement TTE LVEF 65 to 70%, no wall motion abnormality, no significant valvular abnormality.  Isotonic hyponatremia, serum osmole at 275 WNL Most likely due to nutritional  deficiency Started sodium chloride 1 g p.o. 3 times daily Sodium 128--129--132 Monitor sodium level daily  AMS (altered mental status) mild cognitive impairment and confusion was noticed on admission it could be secondary to influenza A viral infection and COPD exacerbation vs hyponatremia CT head negative for any acute findings Continue delirium precautions and supportive care. 5/29 started Seroquel 25 mg p.o. twice daily and trazodone 12.5 mg p.o. nightly   Hypertension, continue propranolol 60 mg p.o. daily home dose Resumed amlodipine 5 mg p.o. daily home dose Use hydralazine as needed Monitor BP and titrate medications accordingly  GERD, continue PPI Hypothyroid, continue Synthroid Rheumatoid arthritis (HCC) No longer on DMARDs.  Not reporting any pain at this time.  Generalized weakness On examination, patient has diffuse weakness that is worse in the bilateral lower extremities.  Per recent visit with PCP, this is chronic.  -PT/OT  Body mass index is 25.89 kg/m.  Interventions:     Diet: Regular diet DVT Prophylaxis: Subcutaneous Lovenox   Advance goals of care discussion: Full code  Family Communication: family was not present at bedside, at the time of interview.  The pt provided permission to discuss medical plan with the family. Opportunity was given to ask question and all questions were answered satisfactorily.  5/27 d/w patient's son over the phone.  Management plan discussed.  All question and concerns answered.  Disposition:  Pt is from Home, admitted with respiratory distress, COPD exacerbation, influenza A positive, ambulatory dysfunction, still has significant wheezing and confusion, which precludes a safe discharge. Discharge to SNF,  when clinically stable, may need to be monitored today, plan is for discharge tomorrow a.m.   Subjective: No significant events overnight, patient is intermittently  confused today, still having significant wheezing and  cough, patient is feeling that it is breaking up and bringing some phlegm.  No chest pain or palpitations, no any other active issues.   Physical Exam: General: NAD, lying comfortably Appear in no distress, affect appropriate Eyes: PERRLA ENT: Oral Mucosa Clear, moist  Neck: no JVD,  Cardiovascular: S1 and S2 Present, no Murmur,  Respiratory: Equal air entry bilaterally, mild crackles, significant wheezing bilaterally Abdomen: Bowel Sound present, Soft and no tenderness,  Skin: no rashes Extremities: mild Pedal edema, no calf tenderness, chronic venous stasis pigmentation Neurologic: without any new focal findings Gait not checked due to patient safety concerns  Vitals:   05/10/23 0500 05/10/23 0523 05/10/23 0859 05/10/23 1125  BP:  (!) 189/132 (!) 182/104 (!) 145/63  Pulse:  74 85 73  Resp:  18 18   Temp:  98.2 F (36.8 C)    TempSrc:  Oral    SpO2:  94% 96%   Weight: 65.2 kg       Intake/Output Summary (Last 24 hours) at 05/10/2023 1430 Last data filed at 05/10/2023 1610 Gross per 24 hour  Intake 0 ml  Output 1175 ml  Net -1175 ml   Filed Weights   05/07/23 1700 05/10/23 0500  Weight: 66.3 kg 65.2 kg    Data Reviewed: I have personally reviewed and interpreted daily labs, tele strips, imagings as discussed above. I reviewed all nursing notes, pharmacy notes, vitals, pertinent old records I have discussed plan of care as described above with RN and patient/family.  CBC: Recent Labs  Lab 05/07/23 1320 05/08/23 0852 05/09/23 0543 05/10/23 0537  WBC 7.4 9.1 9.6 10.4  HGB 12.0 12.9 13.0 13.6  HCT 34.7* 37.3 38.5 39.7  MCV 96.7 95.2 97.0 95.9  PLT 160 160 161 185   Basic Metabolic Panel: Recent Labs  Lab 05/07/23 1320 05/08/23 0852 05/09/23 0543 05/10/23 0537  NA 128* 129* 132* 135  K 4.1 3.9 4.3 3.9  CL 94* 93* 94* 98  CO2 23 25 28 28   GLUCOSE 105* 105* 112* 92  BUN 14 22 25* 23  CREATININE 0.83 1.03* 1.00 0.90  CALCIUM 8.6* 8.6* 8.8* 8.9  MG  --   2.1 2.2 2.1  PHOS  --  4.5 3.9 2.5    Studies: No results found.  Scheduled Meds:  amLODipine  5 mg Oral Daily   enoxaparin (LOVENOX) injection  40 mg Subcutaneous Q24H   fluticasone furoate-vilanterol  1 puff Inhalation Daily   guaiFENesin  600 mg Oral BID   levothyroxine  50 mcg Oral Q0600   pantoprazole  40 mg Oral Daily   predniSONE  40 mg Oral Q breakfast   propranolol ER  60 mg Oral Daily   QUEtiapine  25 mg Oral BID   sodium chloride flush  3 mL Intravenous Q12H   Continuous Infusions: PRN Meds: acetaminophen **OR** acetaminophen, chlorpheniramine-HYDROcodone, hydrALAZINE **OR** hydrALAZINE, ipratropium-albuterol, liver oil-zinc oxide, ondansetron **OR** ondansetron (ZOFRAN) IV, polyethylene glycol  Time spent: 35 minutes  Author: Gillis Santa. MD [facility] 05/10/2023 2:30 PM  To reach On-call, see care teams to locate the attending and reach out to them via [email or web address]. If 7PM-7AM, please contact night-coverage If you still have difficulty reaching the attending provider, please page the [facility] (Director on Call) for [facility] on amion for assistance.

## 2023-05-10 NOTE — Progress Notes (Signed)
PT Cancellation Note  Patient Details Name: [name] MRN: [id number] DOB: [birth year]   Cancelled Treatment:    Reason Eval/Treat Not Completed: Patient declined, no reason specified. Patient declined despite encouragement. Will re-attempt at later time/date.    [name] 05/10/2023, 2:30 PM

## 2023-05-10 NOTE — TOC Progression Note (Addendum)
Transition of Care [facility]) - Progression Note    Patient Details  Name: KRISTELL SCALLY MRN: [id number] Date of Birth: 1930-09-23  [facility] of [facility] CM/SW Contact  Allena Katz, LCSW Phone Number: 05/10/2023, 8:29 AM  Clinical Narrative:    CSW spoke with patient's son regarding SNF placement. Pt reported that he would like patient to go to [facility] as pt has been to [facility] in the past. Pt has been accepted at [facility] and can go when medically ready. Pt's son states she lives alone currently with aides coming in 7 days a week 8am to 9pm. Son reports he has been wanting additional assistance to stay with her at night. Son is currently utilizing people he works with for the support during the day and would like a referral to home care. [facility] referral made to [facility].    Expected Discharge Plan and Services                                               Social Determinants of Health (SDOH) Interventions SDOH Screenings   Food Insecurity: No Food Insecurity (05/07/2023)  Housing: Patient Declined (05/07/2023)  Transportation Needs: No Transportation Needs (05/07/2023)  Utilities: Not At Risk (05/07/2023)  Tobacco Use: Low Risk  (05/07/2023)    Readmission Risk Interventions    07/25/2021   10:49 AM  Readmission Risk Prevention Plan  Transportation Screening Complete  PCP or Specialist Appt within 5-7 Days Complete  Home Care Screening Complete  Medication Review (RN CM) Complete

## 2023-05-11 ENCOUNTER — Inpatient Hospital Stay: Payer: Medicare Other

## 2023-05-11 DIAGNOSIS — J9601 Acute respiratory failure with hypoxia: Secondary | ICD-10-CM | POA: Diagnosis not present

## 2023-05-11 LAB — BASIC METABOLIC PANEL
Anion gap: 9 (ref 5–15)
BUN: 19 mg/dL (ref 8–23)
CO2: 27 mmol/L (ref 22–32)
Calcium: 8.6 mg/dL — ABNORMAL LOW (ref 8.9–10.3)
Chloride: 94 mmol/L — ABNORMAL LOW (ref 98–111)
Creatinine, Ser: 0.74 mg/dL (ref 0.44–1.00)
GFR, Estimated: >60 mL/min (ref >60)
Glucose, Bld: 82 mg/dL (ref 70–99)
Potassium: 4 mmol/L (ref 3.5–5.1)
Sodium: 130 mmol/L — ABNORMAL LOW (ref 135–145)

## 2023-05-11 LAB — MAGNESIUM: Magnesium: 2.1 mg/dL (ref 1.7–2.4)

## 2023-05-11 LAB — CBC
HCT: 40 % (ref 36.0–46.0)
Hemoglobin: 13.6 g/dL (ref 12.0–15.0)
MCH: 32.5 pg (ref 26.0–34.0)
MCHC: 34 g/dL (ref 30.0–36.0)
MCV: 95.5 fL (ref 80.0–100.0)
Platelets: 172 10*3/uL (ref 150–400)
RBC: 4.19 MIL/uL (ref 3.87–5.11)
RDW: 11.5 % (ref 11.5–15.5)
WBC: 9.7 10*3/uL (ref 4.0–10.5)
nRBC: 0 % (ref 0.0–0.2)

## 2023-05-11 LAB — PHOSPHORUS: Phosphorus: 3.3 mg/dL (ref 2.5–4.6)

## 2023-05-11 MED ORDER — QUETIAPINE FUMARATE 25 MG PO TABS
50.0000 mg | ORAL_TABLET | Freq: Two times a day (BID) | ORAL | Status: DC
  Administered 2023-05-11: 50 mg via ORAL
  Filled 2023-05-11 (×2): qty 2

## 2023-05-11 MED ORDER — SODIUM CHLORIDE 1 G PO TABS: 1.0000 g | ORAL_TABLET | Freq: Three times a day (TID) | ORAL | Status: DC

## 2023-05-11 MED ORDER — HALOPERIDOL LACTATE 5 MG/ML IJ SOLN: 2.0000 mg | Freq: Four times a day (QID) | INTRAMUSCULAR | Status: DC | PRN

## 2023-05-11 MED ORDER — HALOPERIDOL LACTATE 5 MG/ML IJ SOLN
2.0000 mg | Freq: Once | INTRAMUSCULAR | Status: AC
  Administered 2023-05-11: 2 mg via INTRAVENOUS
  Filled 2023-05-11: qty 1

## 2023-05-11 MED ORDER — QUETIAPINE FUMARATE 25 MG PO TABS
25.0000 mg | ORAL_TABLET | Freq: Once | ORAL | Status: DC
  Filled 2023-05-11: qty 1

## 2023-05-11 MED ORDER — SODIUM CHLORIDE 1 G PO TABS
1.0000 g | ORAL_TABLET | Freq: Three times a day (TID) | ORAL | Status: AC
  Administered 2023-05-11 – 2023-05-13 (×4): 1 g via ORAL
  Filled 2023-05-11 (×5): qty 1

## 2023-05-11 NOTE — Progress Notes (Signed)
Occupational Therapy Treatment Patient Details Name: [name] MRN: [id number] DOB: November 03, 1930 Today's Date: 05/11/2023   History of present illness [name] is a 89 y.o. 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  MD assessment includes:  acute hypoxic respiratory failure, AMS, hyponatremia, and generalized weakness   OT comments  [name] continues to present with generalized weakness, limited endurance, impaired balance, and fatigue. During today's session she required encouragement to participate in any manner, at first asserting she was too tired to move at all. Pt eventually agreed to attempt sitting EOB. She required Mod A for repositioning b/l LE and trunk and then ongoing support for maintaining sitting balance. Pt is able to remain in seated position for < 4 minutes before requesting return to bed. Will continue to offer OT while hospitalized. DC recommendations remain appropriate.     Recommendations for follow up therapy are one component of a multi-disciplinary discharge planning process, led by the attending physician.  Recommendations may be updated based on patient status, additional functional criteria and insurance authorization.    Assistance Recommended at Discharge Frequent or constant Supervision/Assistance  Patient can return home with the following  A lot of help with bathing/dressing/bathroom;A lot of help with walking and/or transfers;Assistance with cooking/housework;Help with stairs or ramp for entrance;Assist for transportation   Equipment Recommendations  None recommended by OT    Recommendations for Other Services      Precautions / Restrictions  Precautions Precautions: Fall Restrictions Weight Bearing Restrictions: No       Mobility Bed Mobility Overal bed mobility: Needs Assistance Bed Mobility: Supine to Sit, Sit to Supine     Supine to sit: Mod assist Sit to supine: Mod assist   General bed mobility comments: Requires Mod A for repositioning LE and trunk    Transfers                   General transfer comment: Pt refuses, citing fatigue     Balance Overall balance assessment: Needs assistance Sitting-balance support: Feet supported Sitting balance-Leahy Scale: Poor Sitting balance - Comments: external support required                                   ADL either performed or assessed with clinical judgement   ADL                                              Extremity/Trunk Assessment Upper Extremity Assessment Upper Extremity Assessment: Generalized weakness   Lower Extremity Assessment Lower Extremity Assessment: Generalized weakness        Vision       Perception     Praxis      Cognition Arousal/Alertness: Lethargic Behavior During Therapy: WFL for tasks assessed/performed Overall Cognitive Status: No family/caregiver present to determine baseline cognitive functioning                                          Exercises Other Exercises Other  Exercises: Educ re: importance of regular mobility, even if limited to bed mobility, short stands, and transfers    Shoulder Instructions       General Comments      Pertinent Vitals/ Pain       Pain Assessment Pain Assessment: Faces Faces Pain Scale: Hurts little more Pain Location: LE Pain Descriptors / Indicators: Aching Pain Intervention(s): Repositioned, Monitored during session  Home Living                                          Prior Functioning/Environment              Frequency  Min 2X/week        Progress Toward Goals  OT  Goals(current goals can now be found in the care plan section)  Progress towards OT goals: Not progressing toward goals - comment  Acute Rehab OT Goals OT Goal Formulation: With patient Time For Goal Achievement: 05/22/23 Potential to Achieve Goals: Good  Plan Discharge plan remains appropriate;Frequency remains appropriate    Co-evaluation                 AM-PAC OT "6 Clicks" Daily Activity     Outcome Measure   Help from another person eating meals?: A Little Help from another person taking care of personal grooming?: A Little Help from another person toileting, which includes using toliet, bedpan, or urinal?: A Lot Help from another person bathing (including washing, rinsing, drying)?: A Lot Help from another person to put on and taking off regular upper body clothing?: A Little Help from another person to put on and taking off regular lower body clothing?: A Lot 6 Click Score: 15    End of Session    OT Visit Diagnosis: Other abnormalities of gait and mobility (R26.89);Unsteadiness on feet (R26.81);Muscle weakness (generalized) (M62.81)   Activity Tolerance Patient limited by lethargy   Patient Left in bed;with nursing/sitter in room;with call bell/phone within reach;with bed alarm set   Nurse Communication          Time: 1552-1600 OT Time Calculation (min): 8 min  Charges: OT General Charges $OT Visit: 1 Visit OT Treatments $Self Care/Home Management : 8-22 mins Latina Craver, PhD, MS, OTR/L 05/11/23, 4:21 PM

## 2023-05-11 NOTE — Plan of Care (Signed)

## 2023-05-11 NOTE — Plan of Care (Signed)
  Problem: Education: Goal: Knowledge of General Education information will improve Description: Including pain rating scale, medication(s)/side effects and non-pharmacologic comfort measures Outcome: Progressing   Problem: Activity: Goal: Risk for activity intolerance will decrease Outcome: Progressing   Problem: Nutrition: Goal: Adequate nutrition will be maintained Outcome: Progressing   Problem: Coping: Goal: Level of anxiety will decrease Outcome: Progressing   Problem: Elimination: Goal: Will not experience complications related to bowel motility Outcome: Progressing   Problem: Pain Managment: Goal: General experience of comfort will improve Outcome: Progressing   Problem: Skin Integrity: Goal: Risk for impaired skin integrity will decrease Outcome: Progressing   

## 2023-05-11 NOTE — Progress Notes (Signed)
Physical Therapy Treatment Patient Details Name: [name] MRN: [id number] DOB: 24-Sep-1930 Today's Date: 05/11/2023   History of Present Illness [name] is a 89 y.o. 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  MD assessment includes:  acute hypoxic respiratory failure, AMS, hyponatremia, and generalized weakness    PT Comments    Patient sleeping on arrival to room but wakes up easily to voice. She is confused but cooperative. Mod A - Max A required for bed mobility. Limited sitting tolerance before requesting to return to bed. Patient appears fatigued with minimal activity. Recommend to continue PT to maximize independence and decrease caregiver burden.    Recommendations for follow up therapy are one component of a multi-disciplinary discharge planning process, led by the attending physician.  Recommendations may be updated based on patient status, additional functional criteria and insurance authorization.  Follow Up Recommendations  Can patient physically be transported by private vehicle: No    Assistance Recommended at Discharge Frequent or constant Supervision/Assistance  Patient can return home with the following Two people to help with walking and/or transfers;A lot of help with bathing/dressing/bathroom;Assistance with cooking/housework;Direct supervision/assist for medications management;Assist for transportation;Help with stairs or ramp for entrance   Equipment Recommendations  None recommended by PT    Recommendations for Other Services       Precautions / Restrictions Precautions Precautions: Fall Restrictions Weight Bearing Restrictions: No     Mobility  Bed  Mobility Overal bed mobility: Needs Assistance Bed Mobility: Supine to Sit, Sit to Supine     Supine to sit: Mod assist Sit to supine: Mod assist   General bed mobility comments: assisted with trunk and BLE. verbal cues for technique. patient sat up only briefly before requesting to get back to bed    Transfers                   General transfer comment: not attempted due to willingness to remain seated    Ambulation/Gait                   Stairs             Wheelchair Mobility    Modified Rankin (Stroke Patients Only)       Balance Overall balance assessment: Needs assistance Sitting-balance support: Feet unsupported Sitting balance-Leahy Scale: Poor Sitting balance - Comments: external support required                                    Cognition Arousal/Alertness: Lethargic, Suspect due to medications Behavior During Therapy: Flat affect Overall Cognitive Status: Difficult to assess                                 General Comments: patient had Haldol earlier today per nusre report with some confusion today. sleeping unless stimulated today with easy arousal to voice        Exercises      General Comments        Pertinent Vitals/Pain Pain Assessment Pain Assessment: Faces Faces Pain Scale: Hurts a little bit Pain Location: lower legs Pain Descriptors / Indicators: Discomfort Pain Intervention(s):  Monitored during session    Home Living                          Prior Function            PT Goals (current goals can now be found in the care plan section) Acute Rehab PT Goals Patient Stated Goal: none stated PT Goal Formulation: With patient Time For Goal Achievement: 05/21/23 Potential to Achieve Goals: Fair Progress towards PT goals: Progressing toward goals    Frequency    Min 2X/week      PT Plan Current plan remains appropriate    Co-evaluation               AM-PAC PT "6 Clicks" Mobility   Outcome Measure  Help needed turning from your back to your side while in a flat bed without using bedrails?: A Lot Help needed moving from lying on your back to sitting on the side of a flat bed without using bedrails?: A Lot Help needed moving to and from a bed to a chair (including a wheelchair)?: Total Help needed standing up from a chair using your arms (e.g., wheelchair or bedside chair)?: Total Help needed to walk in hospital room?: Total Help needed climbing 3-5 steps with a railing? : Total 6 Click Score: 8    End of Session   Activity Tolerance: Patient limited by fatigue Patient left: in bed;with call bell/phone within reach;with bed alarm set Nurse Communication: Mobility status PT Visit Diagnosis: Unsteadiness on feet (R26.81);History of falling (Z91.81);Difficulty in walking, not elsewhere classified (R26.2);Muscle weakness (generalized) (M62.81)     Time: 4098-1191 PT Time Calculation (min) (ACUTE ONLY): 10 min  Charges:  $Therapeutic Activity: 8-22 mins                     Donna Bernard, PT, MPT    Ina Homes 05/11/2023, 2:26 PM

## 2023-05-11 NOTE — Progress Notes (Signed)
[facility] Progress Note  Patient: [name]    ZOX:[id number]  DOA: 05/07/2023     Date of Service: the patient was seen and examined on 05/11/2023  Chief Complaint  Patient presents with   Weakness   Shortness of Breath   Brief hospital course: [name] is a 89 y.o. 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  Patient was complaining of cough and wheezing, unsure for how long.  No any other symptoms.  Patient has peripheral neuropathy and ambulatory dysfunction as well. ED workup: Tachycardic heart rate 107, tachypneic respiratory rate 22, BP 162/70 saturating well on room air Hyponatremia sodium 128, blood glucose 105, calcium 8.6, BNP 573 elevated, troponin negative, procalcitonin negative, WBC within normal range, CT head negative for any acute findings, CXR: negative for pneumonia, CT chest:: Bibasilar dependent atelectasis. Lungs are otherwise clear.  COVID-negative, RVP pending [facility] hospitalist consulted for further management as below.  Assessment and Plan:  # Acute respiratory distress secondary to influenza A viral infection # COPD exacerbation due to influenza A viral infection RVP positive for influenza A Currently patient is saturating well on room air, use supplemental oxygen as needed Patient was on Advair at home which was switched to [facility] inhaler during hospital stay S/p prednisone 40 mg p.o. daily for total 5 days S/p Lasix 40 mg IV one-time dose in the ED Continue DuoNeb every 6 hourly scheduled, transition to as needed after improvement TTE LVEF 65 to 70%, no wall motion abnormality, no significant valvular abnormality. 5/30 CXR No acute cardiopulmonary abnormality.    # Isotonic hyponatremia, serum osmole at 275  WNL Most likely due to nutritional deficiency Started sodium chloride 1 g p.o. 3 times daily Sodium 128--129--132--130 Monitor sodium level daily  AMS (altered mental status) mild cognitive impairment and confusion was noticed on admission it could be secondary to influenza A viral infection and COPD exacerbation vs hyponatremia CT head negative for any acute findings Continue delirium precautions and supportive care. 5/29 started Seroquel 25 mg p.o. twice daily and trazodone 25 mg p.o. nightly 5/30 increase Seroquel 50 mg p.o. twice daily,  Hypertension, continue propranolol 60 mg p.o. daily home dose Resumed amlodipine 5 mg p.o. daily home dose Use hydralazine as needed Monitor BP and titrate medications accordingly  GERD, continue PPI Hypothyroid, continue Synthroid Rheumatoid arthritis (HCC) No longer on DMARDs.  Not reporting any pain at this time.  Generalized weakness On examination, patient has diffuse weakness that is worse in the bilateral lower extremities.  Per recent visit with PCP, this is chronic.  -PT/OT eval done, recommend SNF placement  Body mass index is 25.89 kg/m.  Interventions:     Diet: Regular diet DVT Prophylaxis: Subcutaneous Lovenox   Advance goals of care discussion: Full code  Family Communication: family was not present at bedside, at the time of interview.  The pt provided permission to discuss medical plan with the family. Opportunity was given to ask question and all questions were answered satisfactorily.  5/27 d/w patient's son over the phone.  Management plan discussed.  All question and concerns answered. 5/28 d/w patient's son at bedside.  Disposition:  Pt is from Home, admitted with respiratory distress, COPD exacerbation, influenza A positive, ambulatory dysfunction, still has significant confusion, which precludes a safe discharge. Discharge to SNF,  when clinically stable, may need 1-2 more days to improve.   Subjective: No  significant events overnight, in the morning time patient was very confused and disoriented, she thought she is at home.  She was asking for her on close and she stripped her gown. Haldol was given and increased Seroquel today.  We will continue to monitor   Physical Exam: General: NAD, very confused and disoriented Eyes: PERRLA ENT: Oral Mucosa Clear, moist  Neck: no JVD,  Cardiovascular: S1 and S2 Present, no Murmur,  Respiratory: Equal air entry bilaterally, no crackles, mild wheezing  Abdomen: Bowel Sound present, Soft and no tenderness,  Skin: no rashes Extremities: mild Pedal edema, no calf tenderness, chronic venous stasis pigmentation Neurologic: without any new focal findings Gait not checked due to patient safety concerns  Vitals:   05/10/23 1738 05/10/23 1935 05/11/23 0604 05/11/23 0740  BP: (!) 157/73 (!) 148/71 (!) 174/84 (!) 153/90  Pulse: 65 64 83 77  Resp: 18 20 18 16   Temp: 98.8 F (37.1 C) 97.9 F (36.6 C) 98.6 F (37 C) 97.8 F (36.6 C)  TempSrc:  Oral  Oral  SpO2: 98% 98% 99% 100%  Weight:        Intake/Output Summary (Last 24 hours) at 05/11/2023 1415 Last data filed at 05/11/2023 1032 Gross per 24 hour  Intake 0 ml  Output 500 ml  Net -500 ml   Filed Weights   05/07/23 1700 05/10/23 0500  Weight: 66.3 kg 65.2 kg    Data Reviewed: I have personally reviewed and interpreted daily labs, tele strips, imagings as discussed above. I reviewed all nursing notes, pharmacy notes, vitals, pertinent old records I have discussed plan of care as described above with RN and patient/family.  CBC: Recent Labs  Lab 05/07/23 1320 05/08/23 0852 05/09/23 0543 05/10/23 0537 05/11/23 0533  WBC 7.4 9.1 9.6 10.4 9.7  HGB 12.0 12.9 13.0 13.6 13.6  HCT 34.7* 37.3 38.5 39.7 40.0  MCV 96.7 95.2 97.0 95.9 95.5  PLT 160 160 161 185 172   Basic Metabolic Panel: Recent Labs  Lab 05/07/23 1320 05/08/23 0852 05/09/23 0543 05/10/23 0537 05/11/23 0533  NA 128*  129* 132* 135 130*  K 4.1 3.9 4.3 3.9 4.0  CL 94* 93* 94* 98 94*  CO2 23 25 28 28 27   GLUCOSE 105* 105* 112* 92 82  BUN 14 22 25* 23 19  CREATININE 0.83 1.03* 1.00 0.90 0.74  CALCIUM 8.6* 8.6* 8.8* 8.9 8.6*  MG  --  2.1 2.2 2.1 2.1  PHOS  --  4.5 3.9 2.5 3.3    Studies: DG Chest Port 1 View  Result Date: 05/11/2023 CLINICAL DATA:  89 year old female with shortness of breath and altered mental status. EXAM: PORTABLE CHEST 1 VIEW COMPARISON:  CT chest 05/07/2023 and earlier. FINDINGS: Portable AP upright view at 0933 hours. Stable lung volumes. Mediastinal contours are stable, within normal limits. Stable left chest dual lead pacemaker. Partially visible cervical and thoracolumbar fusion hardware. Allowing for portable technique the lungs are clear. No pneumothorax or pleural effusion. Negative visible bowel gas. Stable visualized osseous structures. IMPRESSION: No acute cardiopulmonary abnormality. Electronically Signed   By: Odessa Fleming M.D.   On: 05/11/2023 09:46    Scheduled Meds:  amLODipine  5 mg Oral Daily   enoxaparin (LOVENOX) injection  40 mg Subcutaneous Q24H   fluticasone furoate-vilanterol  1 puff Inhalation Daily   guaiFENesin  600 mg Oral BID   levothyroxine  50 mcg Oral Q0600  pantoprazole  40 mg Oral Daily   propranolol ER  60 mg Oral Daily   QUEtiapine  25 mg Oral Once   QUEtiapine  50 mg Oral BID   sodium chloride flush  3 mL Intravenous Q12H   traZODone  25 mg Oral QHS   Continuous Infusions: PRN Meds: acetaminophen **OR** acetaminophen, chlorpheniramine-HYDROcodone, hydrALAZINE **OR** hydrALAZINE, ipratropium-albuterol, liver oil-zinc oxide, ondansetron **OR** ondansetron (ZOFRAN) IV, polyethylene glycol  Time spent: 35 minutes  Author: Gillis Santa. MD [facility] 05/11/2023 2:15 PM  To reach On-call, see care teams to locate the attending and reach out to them via [email or web address]. If 7PM-7AM, please contact night-coverage If you still have difficulty  reaching the attending provider, please page the [facility] (Director on Call) for [facility] on amion for assistance.

## 2023-05-11 NOTE — Progress Notes (Addendum)
Patient was extremely confused today. She was refusing her medications this morning but I was able to talk her in to taking them. Patient took her gown off and was trying to get out of bed. Went to room and dressed patient and repositioned her in the bed. Patient was being loud with staff, calling staff names and trying to hit at staff. Made MD aware, see new orders. Patient also did not eat much breakfast this morning and she refused that eat lunch today. Patients son was called and updated and the patients other son did come to visit patient this afternoon, patient was still confused. Patient also refused to have evening vital signs taken.

## 2023-05-11 NOTE — Progress Notes (Signed)
Patient was having confusion this morning. She did not know where she was and did not know why she was here. Patients son was here to visit and recognized the confusion as well and was concerned. Made MD aware.

## 2023-05-12 DIAGNOSIS — J9601 Acute respiratory failure with hypoxia: Secondary | ICD-10-CM | POA: Diagnosis not present

## 2023-05-12 LAB — URINALYSIS, COMPLETE (UACMP) WITH MICROSCOPIC
Bilirubin Urine: NEGATIVE
Glucose, UA: NEGATIVE mg/dL
Hgb urine dipstick: NEGATIVE
Ketones, ur: 5 mg/dL — AB
Nitrite: NEGATIVE
Protein, ur: 100 mg/dL — AB
Specific Gravity, Urine: 1.031 — ABNORMAL HIGH (ref 1.005–1.030)
pH: 8 (ref 5.0–8.0)

## 2023-05-12 LAB — BASIC METABOLIC PANEL
Anion gap: 9 (ref 5–15)
BUN: 27 mg/dL — ABNORMAL HIGH (ref 8–23)
CO2: 28 mmol/L (ref 22–32)
Calcium: 8.8 mg/dL — ABNORMAL LOW (ref 8.9–10.3)
Chloride: 95 mmol/L — ABNORMAL LOW (ref 98–111)
Creatinine, Ser: 0.82 mg/dL (ref 0.44–1.00)
GFR, Estimated: >60 mL/min (ref >60)
Glucose, Bld: 107 mg/dL — ABNORMAL HIGH (ref 70–99)
Potassium: 4.2 mmol/L (ref 3.5–5.1)
Sodium: 132 mmol/L — ABNORMAL LOW (ref 135–145)

## 2023-05-12 LAB — CBC
HCT: 41.7 % (ref 36.0–46.0)
Hemoglobin: 14.3 g/dL (ref 12.0–15.0)
MCH: 32.9 pg (ref 26.0–34.0)
MCHC: 34.3 g/dL (ref 30.0–36.0)
MCV: 95.9 fL (ref 80.0–100.0)
Platelets: 204 10*3/uL (ref 150–400)
RBC: 4.35 MIL/uL (ref 3.87–5.11)
RDW: 11.3 % — ABNORMAL LOW (ref 11.5–15.5)
WBC: 14.7 10*3/uL — ABNORMAL HIGH (ref 4.0–10.5)
nRBC: 0 % (ref 0.0–0.2)

## 2023-05-12 MED ORDER — HALOPERIDOL LACTATE 5 MG/ML IJ SOLN
5.0000 mg | Freq: Four times a day (QID) | INTRAMUSCULAR | Status: DC | PRN
  Administered 2023-05-15: 5 mg via INTRAVENOUS
  Filled 2023-05-12: qty 1

## 2023-05-12 NOTE — Progress Notes (Signed)
Physical Therapy Treatment Patient Details Name: [name] MRN: [id number] DOB: 12-09-1930 Today's Date: 05/12/2023   History of Present Illness [name] is a 89 y.o. 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  MD assessment includes:  acute hypoxic respiratory failure, AMS, hyponatremia, and generalized weakness    PT Comments    Patient is agitated and confused during session. She does better with distraction and allowing increased time for command following. She is agreeable to sit on the side of the bed with assistance required. Sitting balance was fair and patient sat upright for around 8 minutes. Attempted transfer training, however patient declined due to fear of falling despite trying with and without rolling walker. Patient self limited progression of further activity. Recommend PT follow to maximize independence and facilitate return to prior level of function.    Recommendations for follow up therapy are one component of a multi-disciplinary discharge planning process, led by the attending physician.  Recommendations may be updated based on patient status, additional functional criteria and insurance authorization.  Follow Up Recommendations  Can patient physically be transported by private vehicle: No    Assistance Recommended at Discharge Frequent or constant Supervision/Assistance  Patient can return home with the following Two people to help with walking and/or transfers;A lot of help with bathing/dressing/bathroom;Assistance with cooking/housework;Direct supervision/assist for medications management;Assist for transportation;Help with stairs or ramp for entrance   Equipment  Recommendations  None recommended by PT    Recommendations for Other Services       Precautions / Restrictions Precautions Precautions: Fall Restrictions Weight Bearing Restrictions: No     Mobility  Bed Mobility Overal bed mobility: Needs Assistance Bed Mobility: Supine to Sit, Sit to Supine     Supine to sit: Mod assist Sit to supine: Mod assist   General bed mobility comments: assistance for LE and trunk support. cues for sequencing and technique. increased time and effort required    Transfers                   General transfer comment: patient refused to attempt standing. tried with and without rolling walker with distraction and cues for task initiation. patient declined due to fear of falling.    Ambulation/Gait                   Stairs             Wheelchair Mobility    Modified Rankin (Stroke Patients Only)       Balance                                            Cognition Arousal/Alertness: Awake/alert Behavior During Therapy: Agitated Overall Cognitive Status: No family/caregiver present to determine baseline cognitive functioning                                 General Comments: patient is agitated and needs distraction to participate with PT. she is able to follow single step commands some of the time with extra time. she is confused.        Exercises  General Comments General comments (skin integrity, edema, etc.): patient requested to take hospital socks off and place her own shoes on which she then refused for therapist to remove at end of session.      Pertinent Vitals/Pain Pain Assessment Pain Assessment: Faces Faces Pain Scale: Hurts a little bit Pain Location: feet Pain Descriptors / Indicators: Other (Comment) ("neuropathy") Pain Intervention(s): Limited activity within patient's tolerance    Home Living                          Prior Function             PT Goals (current goals can now be found in the care plan section) Acute Rehab PT Goals Patient Stated Goal: to go home PT Goal Formulation: With patient Time For Goal Achievement: 05/21/23 Potential to Achieve Goals: Fair Progress towards PT goals: Progressing toward goals    Frequency    Min 2X/week      PT Plan Current plan remains appropriate    Co-evaluation              AM-PAC PT "6 Clicks" Mobility   Outcome Measure  Help needed turning from your back to your side while in a flat bed without using bedrails?: A Lot Help needed moving from lying on your back to sitting on the side of a flat bed without using bedrails?: A Lot Help needed moving to and from a bed to a chair (including a wheelchair)?: Total Help needed standing up from a chair using your arms (e.g., wheelchair or bedside chair)?: Total Help needed to walk in hospital room?: Total Help needed climbing 3-5 steps with a railing? : Total 6 Click Score: 8    End of Session         PT Visit Diagnosis: Unsteadiness on feet (R26.81);History of falling (Z91.81);Difficulty in walking, not elsewhere classified (R26.2);Muscle weakness (generalized) (M62.81)     Time: 2956-2130 PT Time Calculation (min) (ACUTE ONLY): 17 min  Charges:  $Therapeutic Activity: 8-22 mins                     Donna Bernard, PT, MPT    [name] 05/12/2023, 12:50 PM

## 2023-05-12 NOTE — TOC Progression Note (Signed)
Transition of Care [facility]) - Progression Note    Patient Details  Name: [name] MRN: [id number] Date of Birth: [birth year]  [facility]) CM/SW Contact  Allena Katz, LCSW Phone Number: 05/12/2023, 2:44 PM  Clinical Narrative:   CSW met with both sons who report they would like to try to take the patient home with 24/7 care through [facility] when agitation improves. Pt to be here over the weekend and TOC to reassess on Monday if family is able to take her home.          Expected Discharge Plan and Services                                               Social Determinants of Health (SDOH) Interventions SDOH Screenings   Food Insecurity: No Food Insecurity (05/07/2023)  Housing: Patient Declined (05/07/2023)  Transportation Needs: No Transportation Needs (05/07/2023)  Utilities: Not At Risk (05/07/2023)  Tobacco Use: Low Risk  (05/07/2023)    Readmission Risk Interventions    07/25/2021   10:49 AM  Readmission Risk Prevention Plan  Transportation Screening Complete  PCP or Specialist Appt within 5-7 Days Complete  Home Care Screening Complete  Medication Review (RN CM) Complete

## 2023-05-12 NOTE — Progress Notes (Signed)
[facility] Progress Note  Patient: [name]    GNF:[id number]  DOA: 05/07/2023     Date of Service: the patient was seen and examined on 05/12/2023  Chief Complaint  Patient presents with   Weakness   Shortness of Breath   Brief hospital course: [name] is a 89 y.o. 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  Patient was complaining of cough and wheezing, unsure for how long.  No any other symptoms.  Patient has peripheral neuropathy and ambulatory dysfunction as well. ED workup: Tachycardic heart rate 107, tachypneic respiratory rate 22, BP 162/70 saturating well on room air Hyponatremia sodium 128, blood glucose 105, calcium 8.6, BNP 573 elevated, troponin negative, procalcitonin negative, WBC within normal range, CT head negative for any acute findings, CXR: negative for pneumonia, CT chest:: Bibasilar dependent atelectasis. Lungs are otherwise clear.  COVID-negative, RVP pending [facility] hospitalist consulted for further management as below.  Assessment and Plan:  # Acute respiratory distress secondary to influenza A viral infection # COPD exacerbation due to influenza A viral infection RVP positive for influenza A Currently patient is saturating well on room air, use supplemental oxygen as needed Patient was on Advair at home which was switched to [facility] inhaler during hospital stay S/p prednisone 40 mg p.o. daily for total 5 days S/p Lasix 40 mg IV one-time dose in the ED Continue DuoNeb every 6 hourly scheduled, transition to as needed after improvement TTE LVEF 65 to 70%, no wall motion abnormality, no significant valvular abnormality. 5/30 CXR No acute cardiopulmonary abnormality.   # Isotonic hyponatremia, serum osmole at 275  WNL Most likely due to nutritional deficiency Started sodium chloride 1 g p.o. 3 times daily Sodium 128--129--132--130 Monitor sodium level daily  Delirium (altered mental status) mild cognitive impairment and confusion was noticed on admission it could be secondary to influenza A viral infection and COPD exacerbation vs hyponatremia. She has a history of delirium. Normal b12 in February of this year, normal tsh earlier this month CT head negative for any acute findings Continue delirium precautions and supportive care. Will increase dose of prn haldol, discontinue standing seroquel and trazodone - given ongoing delirium will broaden w/u with ua and culture, blood cultures   Hypertension, continue propranolol 60 mg p.o. daily home dose Resumed amlodipine 5 mg p.o. daily home dose Use hydralazine as needed Monitor BP and titrate medications accordingly  GERD, continue PPI Hypothyroid, continue Synthroid Rheumatoid arthritis (HCC) No longer on DMARDs.  Not reporting any pain at this time.  Generalized weakness On examination, patient has diffuse weakness that is worse in the bilateral lower extremities.  Per recent visit with PCP, this is chronic.  -PT/OT eval done, recommend SNF placement, pending that  Body mass index is 25.89 kg/m.  Interventions:     Diet: Regular diet DVT Prophylaxis: Subcutaneous Lovenox   Advance goals of care discussion: Full code  Family Communication: no answer when either son telephoned today  Disposition:  Pt is from Home, admitted with respiratory distress, COPD exacerbation, influenza A positive, ambulatory dysfunction, still has significant confusion, which precludes a safe discharge. Discharge to SNF,  when clinically stable, currently too agitated for snf  Subjective: breathing comfortably, remains agitated and confused   Physical Exam: General: NAD, very confused and disoriented, agitated Eyes: normal conjunctiva  ENT: Oral Mucosa Clear,  moist  Cardiovascular: S1 and S2 Present, no Murmur,  Respiratory: Equal air entry bilaterally, no crackles, mild wheezing  Abdomen: Bowel Sound present, Soft and no tenderness,  Skin: no rashes Extremities: mild Pedal edema, no calf tenderness, chronic venous stasis pigmentation Neurologic: without any new focal findings    Vitals:   05/11/23 2022 05/12/23 0413 05/12/23 0500 05/12/23 0758  BP: 119/64 (!) 147/90  (!) 153/93  Pulse: 74 72  81  Resp: 20 18  14   Temp: (!) 97.5 F (36.4 C) (!) 97.5 F (36.4 C)  97.9 F (36.6 C)  TempSrc:      SpO2: 98% 98%  100%  Weight:   65.8 kg     Intake/Output Summary (Last 24 hours) at 05/12/2023 1242 Last data filed at 05/12/2023 0454 Gross per 24 hour  Intake --  Output 800 ml  Net -800 ml   Filed Weights   05/07/23 1700 05/10/23 0500 05/12/23 0500  Weight: 66.3 kg 65.2 kg 65.8 kg    Data Reviewed: I have personally reviewed and interpreted daily labs, tele strips, imagings as discussed above. I reviewed all nursing notes, pharmacy notes, vitals, pertinent old records I have discussed plan of care as described above with RN and patient/family.  CBC: Recent Labs  Lab 05/08/23 0852 05/09/23 0543 05/10/23 0537 05/11/23 0533 05/12/23 0840  WBC 9.1 9.6 10.4 9.7 14.7*  HGB 12.9 13.0 13.6 13.6 14.3  HCT 37.3 38.5 39.7 40.0 41.7  MCV 95.2 97.0 95.9 95.5 95.9  PLT 160 161 185 172 204   Basic Metabolic Panel: Recent Labs  Lab 05/08/23 0852 05/09/23 0543 05/10/23 0537 05/11/23 0533 05/12/23 0840  NA 129* 132* 135 130* 132*  K 3.9 4.3 3.9 4.0 4.2  CL 93* 94* 98 94* 95*  CO2 25 28 28 27 28   GLUCOSE 105* 112* 92 82 107*  BUN 22 25* 23 19 27*  CREATININE 1.03* 1.00 0.90 0.74 0.82  CALCIUM 8.6* 8.8* 8.9 8.6* 8.8*  MG 2.1 2.2 2.1 2.1  --   PHOS 4.5 3.9 2.5 3.3  --     Studies: No results found.  Scheduled Meds:  amLODipine  5 mg Oral Daily   enoxaparin (LOVENOX) injection  40 mg Subcutaneous Q24H   fluticasone  furoate-vilanterol  1 puff Inhalation Daily   guaiFENesin  600 mg Oral BID   levothyroxine  50 mcg Oral Q0600   pantoprazole  40 mg Oral Daily   propranolol ER  60 mg Oral Daily   QUEtiapine  25 mg Oral Once   QUEtiapine  50 mg Oral BID   sodium chloride flush  3 mL Intravenous Q12H   sodium chloride  1 g Oral TID WC   traZODone  25 mg Oral QHS   Continuous Infusions: PRN Meds: acetaminophen **OR** acetaminophen, chlorpheniramine-HYDROcodone, haloperidol lactate, hydrALAZINE **OR** hydrALAZINE, ipratropium-albuterol, liver oil-zinc oxide, ondansetron **OR** ondansetron (ZOFRAN) IV, polyethylene glycol  Time spent: 35 minutes  Author: Shonna Chock, MD [facility] 05/12/2023 12:42 PM  To reach On-call, see care teams to locate the attending and reach out to them via [email or web address]. If 7PM-7AM, please contact night-coverage If you still have difficulty reaching the attending provider, please page the [facility] (Director on Call) for [facility] on amion for assistance.

## 2023-05-12 NOTE — Progress Notes (Signed)
Patient refused to take medications.Hitting, snatching and throwing water cup. Refused breakfast as well. Dr. Ashok Pall notified of medication refusal and fighting.

## 2023-05-12 NOTE — Plan of Care (Signed)
°  Problem: Education: °Goal: Knowledge of General Education information will improve °Description: Including pain rating scale, medication(s)/side effects and non-pharmacologic comfort measures °Outcome: Progressing °  °Problem: Coping: °Goal: Level of anxiety will decrease °Outcome: Progressing °  °Problem: Elimination: °Goal: Will not experience complications related to bowel motility °Outcome: Progressing °  °Problem: Pain Managment: °Goal: General experience of comfort will improve °Outcome: Progressing °  °Problem: Safety: °Goal: Ability to remain free from injury will improve °Outcome: Progressing °  °Problem: Skin Integrity: °Goal: Risk for impaired skin integrity will decrease °Outcome: Progressing °  °

## 2023-05-13 DIAGNOSIS — J9601 Acute respiratory failure with hypoxia: Secondary | ICD-10-CM | POA: Diagnosis not present

## 2023-05-13 LAB — BASIC METABOLIC PANEL
Anion gap: 10 (ref 5–15)
BUN: 27 mg/dL — ABNORMAL HIGH (ref 8–23)
CO2: 22 mmol/L (ref 22–32)
Calcium: 8.6 mg/dL — ABNORMAL LOW (ref 8.9–10.3)
Chloride: 98 mmol/L (ref 98–111)
Creatinine, Ser: 0.92 mg/dL (ref 0.44–1.00)
GFR, Estimated: 59 mL/min — ABNORMAL LOW (ref >60)
Glucose, Bld: 111 mg/dL — ABNORMAL HIGH (ref 70–99)
Potassium: 4.1 mmol/L (ref 3.5–5.1)
Sodium: 130 mmol/L — ABNORMAL LOW (ref 135–145)

## 2023-05-13 LAB — CBC
HCT: 41.1 % (ref 36.0–46.0)
Hemoglobin: 13.9 g/dL (ref 12.0–15.0)
MCH: 32.9 pg (ref 26.0–34.0)
MCHC: 33.8 g/dL (ref 30.0–36.0)
MCV: 97.2 fL (ref 80.0–100.0)
Platelets: 207 10*3/uL (ref 150–400)
RBC: 4.23 MIL/uL (ref 3.87–5.11)
RDW: 11.8 % (ref 11.5–15.5)
WBC: 14.6 10*3/uL — ABNORMAL HIGH (ref 4.0–10.5)
nRBC: 0 % (ref 0.0–0.2)

## 2023-05-13 LAB — URINE CULTURE: Culture: >=100000 — AB

## 2023-05-13 LAB — CULTURE, BLOOD (ROUTINE X 2): Culture: NO GROWTH

## 2023-05-13 NOTE — Progress Notes (Signed)
PROGRESS NOTE    [name]  VWU:[id number] DOB: Jan 10, 1930 DOA: 05/07/2023 PCP: Marguarite Arbour, MD    Brief Narrative:  This 89 y.o. 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  Patient was complaining of cough and wheezing for few days to few weeks.  No any other symptoms.  Patient has peripheral neuropathy and ambulatory dysfunction as well. ED workup: Tachycardic heart rate 107, tachypneic respiratory rate 22, BP 162/70 saturating well on room air Labs : Hyponatremia sodium 128, blood glucose 105, calcium 8.6, BNP 573 elevated, troponin negative, procalcitonin negative, WBC within normal range, CT head negative for any acute findings, CXR: negative for pneumonia, CT chest:: Bibasilar dependent atelectasis. Lungs are otherwise clear.  COVID-negative, RVP pending [facility] hospitalist consulted for further management as below.  Assessment & Plan:   Principal Problem:   Acute hypoxic respiratory failure (HCC) Active Problems:   AMS (altered mental status)   Hyponatremia   Essential (primary) hypertension   Generalized weakness   Rheumatoid arthritis (HCC)   Hypothyroidism  # Acute respiratory distress secondary to influenza A viral infection: # COPD exacerbation due to influenza A viral infection: She presented with worsening / progressive shortness of breath. RVP positive for influenza A Currently patient is saturating well on room air, uses supplemental oxygen as needed Patient was on Advair at home which was switched to [facility] inhaler during hospital stay S/p prednisone 40 mg p.o. daily for total 5 days S/p Lasix 40 mg IV one-time dose in the ED Continue DuoNeb every 6 hourly scheduled, transition to as needed after improvement TTE LVEF 65 to  70%, no wall motion abnormality, no significant valvular abnormality. 5/30 CXR No acute cardiopulmonary abnormality.    # Isotonic hyponatremia, serum osmole at 275 WNL Most likely due to nutritional deficiency. Started sodium chloride 1 g p.o. 3 times daily Sodium 128--129--132--130 Monitor sodium level daily.   Delirium (altered mental status): Mild cognitive impairment and confusion was noticed on admission,  it could be secondary to influenza A viral infection and COPD exacerbation vs hyponatremia.  She has a history of delirium. Normal b12 in February of this year, normal TSH earlier this month CT head negative for any acute findings. Continue delirium precautions and supportive care. Will increase dose of prn haldol, discontinue standing seroquel and trazodone UA shows trace leukocyte esterase.  Patient denies any urinary symptoms. Follow-up blood and urine cultures.   Hypertension: Continue propranolol 60 mg p.o. daily  Continue  amlodipine 5 mg p.o. daily Use hydralazine as needed Monitor BP and titrate medications accordingly   GERD: Continue PPI. Hypothyroidism:  Continue Synthroid Rheumatoid arthritis (HCC) No longer on DMARDs.  Not reporting any pain at this time.   Generalized weakness On examination, patient has diffuse weakness that is worse in the bilateral lower extremities.  Per recent visit with PCP, this is chronic.  -PT/OT eval done, recommend SNF placement, pending Insurance auth   Body mass index is 25.89 kg/m.  Interventions:  DVT prophylaxis: Lovenox Code Status: Full code Family Communication: No family at bed side. Disposition Plan:    Status is: Inpatient Remains inpatient appropriate because:  Admitted with respiratory distress, COPD exacerbation, influenza A positive, ambulatory dysfunction, still has significant confusion, which precludes a safe discharge. Discharge to SNF,  when clinically stable, currently too agitated for  SNF     Consultants:  None  Procedures: None  Antimicrobials:  Anti-infectives (From admission, onward)    None      Subjective: Patient was seen and examined at bedside.  Overnight events noted.   She does not appear agitated today , appears much calmer.  Objective: Vitals:   05/12/23 2109 05/13/23 0419 05/13/23 0500 05/13/23 0839  BP: (!) 144/61 (!) 152/82  (!) 133/110  Pulse: 79 81  86  Resp: 18 18  16   Temp: 98.2 F (36.8 C) 98 F (36.7 C)  97.6 F (36.4 C)  TempSrc: Oral   Oral  SpO2: 98% 100%  98%  Weight:   66.1 kg    No intake or output data in the 24 hours ending 05/13/23 1139 Filed Weights   05/10/23 0500 05/12/23 0500 05/13/23 0500  Weight: 65.2 kg 65.8 kg 66.1 kg    Examination:  General exam: Appears calm and comfortable, deconditioned, not in any acute distress. Respiratory system: Clear to auscultation. Respiratory effort normal.  RR 16 Cardiovascular system: S1 & S2 heard, RRR. No JVD, murmurs, rubs, gallops or clicks. No pedal edema. Gastrointestinal system: Abdomen is soft, non tender, non distended, BS+ Central nervous system: Alert and oriented x 1. No focal neurological deficits. Extremities: No edema, no cyanosis, no clubbing Skin: No rashes, lesions or ulcers Psychiatry: Judgement and insight appear normal. Mood & affect appropriate.     Data Reviewed: I have personally reviewed following labs and imaging studies  CBC: Recent Labs  Lab 05/09/23 0543 05/10/23 0537 05/11/23 0533 05/12/23 0840 05/13/23 0934  WBC 9.6 10.4 9.7 14.7* 14.6*  HGB 13.0 13.6 13.6 14.3 13.9  HCT 38.5 39.7 40.0 41.7 41.1  MCV 97.0 95.9 95.5 95.9 97.2  PLT 161 185 172 204 207   Basic Metabolic Panel: Recent Labs  Lab 05/08/23 0852 05/09/23 0543 05/10/23 0537 05/11/23 0533 05/12/23 0840 05/13/23 0934  NA 129* 132* 135 130* 132* 130*  K 3.9 4.3 3.9 4.0 4.2 4.1  CL 93* 94* 98 94* 95* 98  CO2 25 28 28 27 28 22   GLUCOSE 105* 112* 92 82 107* 111*  BUN 22  25* 23 19 27* 27*  CREATININE 1.03* 1.00 0.90 0.74 0.82 0.92  CALCIUM 8.6* 8.8* 8.9 8.6* 8.8* 8.6*  MG 2.1 2.2 2.1 2.1  --   --   PHOS 4.5 3.9 2.5 3.3  --   --    GFR: Estimated Creatinine Clearance: 37.1 mL/min (by C-G formula based on SCr of 0.92 mg/dL). Liver Function Tests: Recent Labs  Lab 05/07/23 1320  AST 38  ALT 22  ALKPHOS 70  BILITOT 0.9  PROT 6.6  ALBUMIN 3.6   No results for input(s): "LIPASE", "AMYLASE" in the last 168 hours. No results for input(s): "AMMONIA" in the last 168 hours. Coagulation Profile: No results for input(s): "INR", "PROTIME" in the last 168 hours. Cardiac Enzymes: No results for input(s): "CKTOTAL", "CKMB", "CKMBINDEX", "TROPONINI" in the last 168 hours. BNP (last 3 results) No results for input(s): "PROBNP" in the last 8760 hours. HbA1C: No results for input(s): "HGBA1C" in the last 72 hours. CBG: No results for input(s): "GLUCAP" in the last 168 hours. Lipid Profile: No results for input(s): "CHOL", "HDL", "LDLCALC", "TRIG", "CHOLHDL", "LDLDIRECT" in the last 72 hours. Thyroid Function Tests: No results for input(s): "TSH", "T4TOTAL", "FREET4", "T3FREE", "THYROIDAB" in the last 72 hours. Anemia Panel: No results for input(s): "VITAMINB12", "FOLATE", "FERRITIN", "TIBC", "IRON", "RETICCTPCT" in the last 72 hours. Sepsis  Labs: Recent Labs  Lab 05/07/23 1322  PROCALCITON <0.10    Recent Results (from the past 240 hour(s))  SARS Coronavirus 2 by RT PCR (hospital order, performed in [facility] hospital lab) *cepheid single result test* Anterior Nasal Swab     Status: None   Collection Time: 05/07/23  1:40 PM   Specimen: Anterior Nasal Swab  Result Value Ref Range Status   SARS Coronavirus 2 by RT PCR NEGATIVE NEGATIVE Final    Comment: (NOTE) SARS-CoV-2 target nucleic acids are NOT DETECTED.  The SARS-CoV-2 RNA is generally detectable in upper and lower respiratory specimens during the acute phase of infection. The  lowest concentration of SARS-CoV-2 viral copies this assay can detect is 250 copies / mL. A negative result does not preclude SARS-CoV-2 infection and should not be used as the sole basis for treatment or other patient management decisions.  A negative result may occur with improper specimen collection / handling, submission of specimen other than nasopharyngeal swab, presence of viral mutation(s) within the areas targeted by this assay, and inadequate number of viral copies (<250 copies / mL). A negative result must be combined with clinical observations, patient history, and epidemiological information.  Fact Sheet for Patients:   RoadLapTop.co.za  Fact Sheet for Healthcare Providers: http://kim-miller.com/  This test is not yet approved or  cleared by the [place] FDA and has been authorized for detection and/or diagnosis of SARS-CoV-2 by FDA under an Emergency Use Authorization (EUA).  This EUA will remain in effect (meaning this test can be used) for the duration of the COVID-19 declaration under Section 564(b)(1) of the Act, 21 U.S.C. section 360bbb-3(b)(1), unless the authorization is terminated or revoked sooner.  Performed at [facility], [address] [place], [place] [address]   Respiratory (~20 pathogens) panel by PCR     Status: Abnormal   Collection Time: 05/08/23  5:05 AM   Specimen: Nasopharyngeal Swab; Respiratory  Result Value Ref Range Status   Adenovirus NOT DETECTED NOT DETECTED Final   Coronavirus 229E NOT DETECTED NOT DETECTED Final    Comment: (NOTE) The Coronavirus on the Respiratory Panel, DOES NOT test for the novel  Coronavirus (2019 nCoV)    Coronavirus HKU1 NOT DETECTED NOT DETECTED Final   Coronavirus NL63 NOT DETECTED NOT DETECTED Final   Coronavirus OC43 NOT DETECTED NOT DETECTED Final   Metapneumovirus NOT DETECTED NOT DETECTED Final   Rhinovirus / Enterovirus NOT DETECTED NOT  DETECTED Final   Influenza A H3 DETECTED (A) NOT DETECTED Final   Influenza B NOT DETECTED NOT DETECTED Final   Parainfluenza Virus 1 NOT DETECTED NOT DETECTED Final   Parainfluenza Virus 2 NOT DETECTED NOT DETECTED Final   Parainfluenza Virus 3 NOT DETECTED NOT DETECTED Final   Parainfluenza Virus 4 NOT DETECTED NOT DETECTED Final   Respiratory Syncytial Virus NOT DETECTED NOT DETECTED Final   Bordetella pertussis NOT DETECTED NOT DETECTED Final   Bordetella Parapertussis NOT DETECTED NOT DETECTED Final   Chlamydophila pneumoniae NOT DETECTED NOT DETECTED Final   Mycoplasma pneumoniae NOT DETECTED NOT DETECTED Final    Comment: Performed at [facility], [address], [place], [place] [address]  Culture, blood (Routine X 2) w Reflex to ID Panel     Status: None (Preliminary result)   Collection Time: 05/12/23  3:21 PM   Specimen: BLOOD  Result Value Ref Range Status   Specimen Description BLOOD RIGHT ANTECUBITAL  Final   Special Requests   Final  BOTTLES DRAWN AEROBIC AND ANAEROBIC Blood Culture adequate volume   Culture   Final    NO GROWTH < 24 HOURS Performed at [facility], [address] [place], [place] [address]    Report Status PENDING  Incomplete  Culture, blood (Routine X 2) w Reflex to ID Panel     Status: None (Preliminary result)   Collection Time: 05/12/23  3:22 PM   Specimen: BLOOD  Result Value Ref Range Status   Specimen Description BLOOD LEFT ANTECUBITAL  Final   Special Requests   Final    BOTTLES DRAWN AEROBIC AND ANAEROBIC Blood Culture adequate volume   Culture   Final    NO GROWTH < 24 HOURS Performed at [facility], [address], [place], [place] [address]    Report Status PENDING  Incomplete    Radiology Studies: No results found.  Scheduled Meds:  amLODipine  5 mg Oral Daily   enoxaparin (LOVENOX) injection  40 mg Subcutaneous Q24H   fluticasone furoate-vilanterol  1 puff Inhalation Daily   guaiFENesin  600 mg  Oral BID   levothyroxine  50 mcg Oral Q0600   pantoprazole  40 mg Oral Daily   propranolol ER  60 mg Oral Daily   sodium chloride flush  3 mL Intravenous Q12H   sodium chloride  1 g Oral TID WC   Continuous Infusions:   LOS: 6 days    Time spent: 50 mins    Willeen Niece, MD [facility]   If 7PM-7AM, please contact night-coverage

## 2023-05-14 DIAGNOSIS — J9601 Acute respiratory failure with hypoxia: Secondary | ICD-10-CM | POA: Diagnosis not present

## 2023-05-14 LAB — URINE CULTURE

## 2023-05-14 MED ORDER — POLYETHYLENE GLYCOL 3350 17 G PO PACK
17.0000 g | PACK | Freq: Every day | ORAL | Status: DC
  Administered 2023-05-14 – 2023-05-15 (×2): 17 g via ORAL
  Filled 2023-05-14 (×2): qty 1

## 2023-05-14 MED ORDER — OSELTAMIVIR PHOSPHATE 30 MG PO CAPS
30.0000 mg | ORAL_CAPSULE | Freq: Two times a day (BID) | ORAL | Status: DC
  Administered 2023-05-15 – 2023-05-16 (×3): 30 mg via ORAL
  Filled 2023-05-14 (×3): qty 1

## 2023-05-14 MED ORDER — SODIUM CHLORIDE 0.9 % IV SOLN
1.0000 g | INTRAVENOUS | Status: DC
  Administered 2023-05-14: 1 g via INTRAVENOUS
  Filled 2023-05-14: qty 10

## 2023-05-14 MED ORDER — OSELTAMIVIR PHOSPHATE 75 MG PO CAPS
75.0000 mg | ORAL_CAPSULE | Freq: Once | ORAL | Status: AC
  Administered 2023-05-14: 75 mg via ORAL
  Filled 2023-05-14: qty 1

## 2023-05-14 MED ORDER — CEFADROXIL 500 MG PO CAPS
500.0000 mg | ORAL_CAPSULE | Freq: Two times a day (BID) | ORAL | Status: DC
  Administered 2023-05-15 – 2023-05-16 (×3): 500 mg via ORAL
  Filled 2023-05-14 (×3): qty 1

## 2023-05-14 NOTE — Plan of Care (Signed)

## 2023-05-14 NOTE — Progress Notes (Signed)
A consult was placed to the IV Nurse for new IV access;  both patient's hands, arms bruised severely with thin, fragile skin noted; no suitable veins noted in upper arms; only able to place a 24 ga in the left hand for her antibiotic;  RN at bedside during IV restart, as pt is pleasantly confused .  She will not allow iv site to be wrapped loosely with gauze;  pt has no other suitable iv access if this site is lost.

## 2023-05-14 NOTE — Plan of Care (Signed)
  Problem: Education: Goal: Knowledge of General Education information will improve Description: Including pain rating scale, medication(s)/side effects and non-pharmacologic comfort measures Outcome: Progressing   Problem: Health Behavior/Discharge Planning: Goal: Ability to manage health-related needs will improve Outcome: Progressing   Problem: Activity: Goal: Risk for activity intolerance will decrease Outcome: Progressing   Problem: Nutrition: Goal: Adequate nutrition will be maintained Outcome: Progressing   Problem: Coping: Goal: Level of anxiety will decrease Outcome: Progressing   Problem: Elimination: Goal: Will not experience complications related to bowel motility Outcome: Progressing   Problem: Pain Managment: Goal: General experience of comfort will improve Outcome: Progressing   Problem: Safety: Goal: Ability to remain free from injury will improve Outcome: Progressing   Problem: Skin Integrity: Goal: Risk for impaired skin integrity will decrease Outcome: Progressing   

## 2023-05-14 NOTE — Progress Notes (Signed)
PROGRESS NOTE    [name]  ZOX:[id number] DOB: [birth year] DOA: 05/07/2023 PCP: Marguarite Arbour, MD    Brief Narrative:  This 89 y.o. 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  Patient was complaining of cough and wheezing for few days to few weeks.  No any other symptoms.  Patient has peripheral neuropathy and ambulatory dysfunction as well. ED workup: Tachycardic heart rate 107, tachypneic respiratory rate 22, BP 162/70 saturating well on room air Labs : Hyponatremia sodium 128, blood glucose 105, calcium 8.6, BNP 573 elevated, troponin negative, procalcitonin negative, WBC within normal range, CT head negative for any acute findings, CXR: negative for pneumonia, CT chest:: Bibasilar dependent atelectasis. Lungs are otherwise clear.  COVID-negative, RVP pending [facility] hospitalist consulted for further management as below.  Assessment & Plan:   Principal Problem:   Acute hypoxic respiratory failure (HCC) Active Problems:   AMS (altered mental status)   Hyponatremia   Essential (primary) hypertension   Generalized weakness   Rheumatoid arthritis (HCC)   Hypothyroidism  # Acute respiratory distress secondary to influenza A viral infection: # COPD exacerbation due to influenza A viral infection: She presented with worsening / progressive shortness of breath. RVP positive for influenza A Currently patient is saturating well on room air, uses supplemental oxygen as needed Patient was on Advair at home which was switched to [facility] inhaler during hospital stay S/p prednisone 40 mg p.o. daily for total 5 days S/p Lasix 40 mg IV one-time dose in the ED Continue DuoNeb every 6 hourly scheduled, transition to as needed after improvement TTE LVEF 65 to  70%, no wall motion abnormality, no significant valvular abnormality. 5/30 CXR No acute cardiopulmonary abnormality.  - will start tamiflu today   # Isotonic hyponatremia, serum osmole at 275 WNL Most likely due to nutritional deficiency. Started sodium chloride 1 g p.o. 3 times daily Sodium 128--129--132--130 Monitor sodium level daily.   Delirium (altered mental status): Mild cognitive impairment and confusion was noticed on admission,  it could be secondary to influenza A viral infection and COPD exacerbation vs hyponatremia.  She has a history of delirium. Normal b12 in February of this year, normal TSH earlier this month CT head negative for any acute findings. Continue delirium precautions and supportive care. Have discontinued standing antipsychotics Possible uti  Acute Cystitis? Proetus growing in urine culture. Urine much more cloudy than on presentation. Does have a hx of cystitis-induced delirium - will start ceftriaxone and f/u culture results   Hypertension: Continue propranolol 60 mg p.o. daily  Continue  amlodipine 5 mg p.o. daily Use hydralazine as needed Monitor BP and titrate medications accordingly   GERD: Continue PPI. Hypothyroidism:  Continue Synthroid Rheumatoid arthritis (HCC) No longer on DMARDs.  Not reporting any pain at this time.   Generalized weakness On examination, patient has diffuse weakness that is worse in the bilateral lower extremities.  Per recent visit with PCP, this is chronic.  -PT/OT eval done, recommend SNF placement, pending Insurance auth, though sounds like family now interested in taking home   Body mass index is 25.89 kg/m.  Interventions:  DVT prophylaxis: Lovenox Code Status: Full code Family Communication: No family at bed side. No answer when all contacts called, message left Disposition Plan:    Status is: Inpatient    Consultants:  None  Procedures: None  Antimicrobials:  Anti-infectives (From admission,  onward)    None      Subjective: Calm, confused, no pain  Objective: Vitals:   05/13/23 2020 05/14/23 0500 05/14/23 0519 05/14/23 0812  BP: 122/60  (!) 160/73 (!) 128/111  Pulse: 73  70 70  Resp: 16  18 18   Temp: 98.1 F (36.7 C)  97.9 F (36.6 C) 98 F (36.7 C)  TempSrc:      SpO2: 97%  98% 98%  Weight:  66.1 kg      Intake/Output Summary (Last 24 hours) at 05/14/2023 1031 Last data filed at 05/14/2023 0612 Gross per 24 hour  Intake --  Output 400 ml  Net -400 ml   Filed Weights   05/12/23 0500 05/13/23 0500 05/14/23 0500  Weight: 65.8 kg 66.1 kg 66.1 kg    Examination:  General exam: Appears calm and comfortable, deconditioned, not in any acute distress. Respiratory system: scattered exp wheeze Cardiovascular system: S1 & S2 heard, RRR. No JVD, murmurs, rubs, gallops or clicks. No pedal edema. Gastrointestinal system: Abdomen is soft, non tender, non distended, BS+ Central nervous system: Alert and oriented x 1. No focal neurological deficits. Extremities: No edema, no cyanosis, no clubbing Skin: No rashes, lesions or ulcers Psychiatry: pleasantly confused    Data Reviewed: I have personally reviewed following labs and imaging studies  CBC: Recent Labs  Lab 05/09/23 0543 05/10/23 0537 05/11/23 0533 05/12/23 0840 05/13/23 0934  WBC 9.6 10.4 9.7 14.7* 14.6*  HGB 13.0 13.6 13.6 14.3 13.9  HCT 38.5 39.7 40.0 41.7 41.1  MCV 97.0 95.9 95.5 95.9 97.2  PLT 161 185 172 204 207   Basic Metabolic Panel: Recent Labs  Lab 05/08/23 0852 05/09/23 0543 05/10/23 0537 05/11/23 0533 05/12/23 0840 05/13/23 0934  NA 129* 132* 135 130* 132* 130*  K 3.9 4.3 3.9 4.0 4.2 4.1  CL 93* 94* 98 94* 95* 98  CO2 25 28 28 27 28 22   GLUCOSE 105* 112* 92 82 107* 111*  BUN 22 25* 23 19 27* 27*  CREATININE 1.03* 1.00 0.90 0.74 0.82 0.92  CALCIUM 8.6* 8.8* 8.9 8.6* 8.8* 8.6*  MG 2.1 2.2 2.1 2.1  --   --   PHOS 4.5 3.9 2.5 3.3  --   --    GFR: Estimated Creatinine  Clearance: 37.1 mL/min (by C-G formula based on SCr of 0.92 mg/dL). Liver Function Tests: Recent Labs  Lab 05/07/23 1320  AST 38  ALT 22  ALKPHOS 70  BILITOT 0.9  PROT 6.6  ALBUMIN 3.6   No results for input(s): "LIPASE", "AMYLASE" in the last 168 hours. No results for input(s): "AMMONIA" in the last 168 hours. Coagulation Profile: No results for input(s): "INR", "PROTIME" in the last 168 hours. Cardiac Enzymes: No results for input(s): "CKTOTAL", "CKMB", "CKMBINDEX", "TROPONINI" in the last 168 hours. BNP (last 3 results) No results for input(s): "PROBNP" in the last 8760 hours. HbA1C: No results for input(s): "HGBA1C" in the last 72 hours. CBG: No results for input(s): "GLUCAP" in the last 168 hours. Lipid Profile: No results for input(s): "CHOL", "HDL", "LDLCALC", "TRIG", "CHOLHDL", "LDLDIRECT" in the last 72 hours. Thyroid Function Tests: No results for input(s): "TSH", "T4TOTAL", "FREET4", "T3FREE", "THYROIDAB" in the last 72 hours. Anemia Panel: No results for input(s): "VITAMINB12", "FOLATE", "FERRITIN", "TIBC", "IRON", "RETICCTPCT" in the last 72 hours. Sepsis Labs: Recent Labs  Lab 05/07/23 1322  PROCALCITON <0.10    Recent Results (from the past 240 hour(s))  SARS Coronavirus 2 by RT PCR (hospital order, performed in [facility] hospital lab) *cepheid single result test* Anterior Nasal Swab     Status: None   Collection Time: 05/07/23  1:40 PM   Specimen: Anterior Nasal Swab  Result Value Ref Range Status   SARS Coronavirus 2 by RT PCR NEGATIVE NEGATIVE Final    Comment: (NOTE) SARS-CoV-2 target nucleic acids are NOT DETECTED.  The SARS-CoV-2 RNA is generally detectable in upper and lower respiratory specimens during the acute phase of infection. The lowest concentration of SARS-CoV-2 viral copies this assay can detect is 250 copies / mL. A negative result does not preclude SARS-CoV-2 infection and should not be used as the sole basis for treatment or  other patient management decisions.  A negative result may occur with improper specimen collection / handling, submission of specimen other than nasopharyngeal swab, presence of viral mutation(s) within the areas targeted by this assay, and inadequate number of viral copies (<250 copies / mL). A negative result must be combined with clinical observations, patient history, and epidemiological information.  Fact Sheet for Patients:   RoadLapTop.co.za  Fact Sheet for Healthcare Providers: http://kim-miller.com/  This test is not yet approved or  cleared by the [place] FDA and has been authorized for detection and/or diagnosis of SARS-CoV-2 by FDA under an Emergency Use Authorization (EUA).  This EUA will remain in effect (meaning this test can be used) for the duration of the COVID-19 declaration under Section 564(b)(1) of the Act, 21 U.S.C. section 360bbb-3(b)(1), unless the authorization is terminated or revoked sooner.  Performed at [facility], [address] [place], [place] [address]   Respiratory (~20 pathogens) panel by PCR     Status: Abnormal   Collection Time: 05/08/23  5:05 AM   Specimen: Nasopharyngeal Swab; Respiratory  Result Value Ref Range Status   Adenovirus NOT DETECTED NOT DETECTED Final   Coronavirus 229E NOT DETECTED NOT DETECTED Final    Comment: (NOTE) The Coronavirus on the Respiratory Panel, DOES NOT test for the novel  Coronavirus (2019 nCoV)    Coronavirus HKU1 NOT DETECTED NOT DETECTED Final   Coronavirus NL63 NOT DETECTED NOT DETECTED Final   Coronavirus OC43 NOT DETECTED NOT DETECTED Final   Metapneumovirus NOT DETECTED NOT DETECTED Final   Rhinovirus / Enterovirus NOT DETECTED NOT DETECTED Final   Influenza A H3 DETECTED (A) NOT DETECTED Final   Influenza B NOT DETECTED NOT DETECTED Final   Parainfluenza Virus 1 NOT DETECTED NOT DETECTED Final   Parainfluenza Virus 2 NOT DETECTED NOT  DETECTED Final   Parainfluenza Virus 3 NOT DETECTED NOT DETECTED Final   Parainfluenza Virus 4 NOT DETECTED NOT DETECTED Final   Respiratory Syncytial Virus NOT DETECTED NOT DETECTED Final   Bordetella pertussis NOT DETECTED NOT DETECTED Final   Bordetella Parapertussis NOT DETECTED NOT DETECTED Final   Chlamydophila pneumoniae NOT DETECTED NOT DETECTED Final   Mycoplasma pneumoniae NOT DETECTED NOT DETECTED Final    Comment: Performed at [facility], [address] [place], [place] [address]  Urine Culture (for pregnant, neutropenic or urologic patients or patients with an indwelling urinary catheter)     Status: Abnormal (Preliminary result)   Collection Time: 05/12/23  1:50 PM   Specimen: Urine, Clean Catch  Result Value Ref Range Status   Specimen Description   Final    URINE, CLEAN CATCH Performed at [facility], [address], [place], [place] [address]    Special Requests  Final    NONE Performed at [facility], [address] [place], [place] [address]    Culture (A)  Final    >=100,000 COLONIES/mL PROTEUS MIRABILIS SUSCEPTIBILITIES TO FOLLOW Performed at [facility], [address] [place], [place] [address]    Report Status PENDING  Incomplete  Culture, blood (Routine X 2) w Reflex to ID Panel     Status: None (Preliminary result)   Collection Time: 05/12/23  3:21 PM   Specimen: BLOOD  Result Value Ref Range Status   Specimen Description BLOOD RIGHT ANTECUBITAL  Final   Special Requests   Final    BOTTLES DRAWN AEROBIC AND ANAEROBIC Blood Culture adequate volume   Culture   Final    NO GROWTH 2 DAYS Performed at [facility], [address] [place], [place] [address]    Report Status PENDING  Incomplete  Culture, blood (Routine X 2) w Reflex to ID Panel     Status: None (Preliminary result)   Collection Time: 05/12/23  3:22 PM   Specimen: BLOOD  Result Value Ref Range Status   Specimen Description BLOOD LEFT  ANTECUBITAL  Final   Special Requests   Final    BOTTLES DRAWN AEROBIC AND ANAEROBIC Blood Culture adequate volume   Culture   Final    NO GROWTH 2 DAYS Performed at [facility], [address] [place], [place] [address]    Report Status PENDING  Incomplete    Radiology Studies: No results found.  Scheduled Meds:  amLODipine  5 mg Oral Daily   enoxaparin (LOVENOX) injection  40 mg Subcutaneous Q24H   fluticasone furoate-vilanterol  1 puff Inhalation Daily   guaiFENesin  600 mg Oral BID   levothyroxine  50 mcg Oral Q0600   pantoprazole  40 mg Oral Daily   propranolol ER  60 mg Oral Daily   sodium chloride flush  3 mL Intravenous Q12H   Continuous Infusions:   LOS: 7 days    Time spent: 40 mins    Silvano Bilis, MD [facility]   If 7PM-7AM, please contact night-coverage

## 2023-05-15 DIAGNOSIS — J9601 Acute respiratory failure with hypoxia: Secondary | ICD-10-CM | POA: Diagnosis not present

## 2023-05-15 LAB — CULTURE, BLOOD (ROUTINE X 2): Special Requests: ADEQUATE

## 2023-05-15 NOTE — TOC Progression Note (Addendum)
Transition of Care [facility]) - Progression Note    Patient Details  Name: [name] MRN: [id number] Date of Birth: 10/28/1930  [facility]) CM/SW Contact  Margarito Liner, LCSW Phone Number: 05/15/2023, 12:55 PM  Clinical Narrative:   [facility] will start services tomorrow at 8:00 pm. Their schedule will be 8:00 pm-8:00 am while patient's other PCS agency is there 8:00 am-8:00 pm. Per TOC handoff, patient needs a 3-in-1 and wheelchair. There are currently not DME recommendations and PT eval states that patient already has a 3-in-1 and wheelchair. Left voicemail for son, Minerva Areola.  2:20 pm: Received call back from [place]. He confirmed patient already has a 3-in-1 and wheelchair at home. He will pick her up when discharged tomorrow.  Expected Discharge Plan and Services                                               Social Determinants of Health (SDOH) Interventions SDOH Screenings   Food Insecurity: No Food Insecurity (05/07/2023)  Housing: Patient Declined (05/07/2023)  Transportation Needs: No Transportation Needs (05/07/2023)  Utilities: Not At Risk (05/07/2023)  Tobacco Use: Low Risk  (05/07/2023)    Readmission Risk Interventions    07/25/2021   10:49 AM  Readmission Risk Prevention Plan  Transportation Screening Complete  PCP or Specialist Appt within 5-7 Days Complete  Home Care Screening Complete  Medication Review (RN CM) Complete

## 2023-05-15 NOTE — Progress Notes (Signed)
Occupational Therapy Treatment Patient Details Name: [name] MRN: [id number] DOB: 08-Aug-1930 Today's Date: 05/15/2023   History of present illness DESI FRED is a 89 y.o. 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  MD assessment includes:  acute hypoxic respiratory failure, AMS, hyponatremia, and generalized weakness   OT comments  Pt seen for OT tx this date. Pt initially declining noting that the MD was going to come see her soon and she would get up for him. With encouragement, pt agreeable to EOB ADL session but unwilling to attempt standing 2/2 fear of falling. Pt cited grippy socks as too slick but declined to don her own socks and shoes to attempt. Pt completed grooming tasks seated EOB with set up and SBA for safety. No overt LOB noted - an improvement from previous OT session. Pt oriented to self and hospital, but thought it was Tuesday in April. Follows simple commands with time/cues. Less assist required for bed mobility this date. Pt demonstrating progress towards goals. Continues to benefit from skilled OT services to maximize safety/return to PLOF.    Recommendations for follow up therapy are one component of a multi-disciplinary discharge planning process, led by the attending physician.  Recommendations may be updated based on patient status, additional functional criteria and insurance authorization.    Assistance Recommended at Discharge Frequent or constant Supervision/Assistance  Patient can return home with the following  A lot of help with bathing/dressing/bathroom;A lot of help with walking and/or transfers;Assistance with cooking/housework;Help with stairs or ramp for  entrance;Assist for transportation   Equipment Recommendations       Recommendations for Other Services      Precautions / Restrictions Precautions Precautions: Fall Restrictions Weight Bearing Restrictions: No       Mobility Bed Mobility Overal bed mobility: Needs Assistance Bed Mobility: Supine to Sit, Sit to Supine     Supine to sit: Min assist Sit to supine: Min guard   General bed mobility comments: MIN A for shifting hips forward so pt more steady EOB    Transfers                   General transfer comment: Pt declined 2/2 fear of falling     Balance Overall balance assessment: Needs assistance Sitting-balance support: Feet supported, No upper extremity supported Sitting balance-Leahy Scale: Fair Sitting balance - Comments: SBA for static sitting balance, no UE support                                   ADL either performed or assessed with clinical judgement   ADL Overall ADL's : Needs assistance/impaired     Grooming: Sitting;Supervision/safety;Set up;Wash/dry face;Oral care                                      Extremity/Trunk Assessment              Vision       Perception     Praxis      Cognition Arousal/Alertness: Awake/alert Behavior During Therapy: Anxious Overall Cognitive Status: No family/caregiver present to determine baseline cognitive functioning  General Comments: Pt anxious, fearful of falling and declining mobility, with encouragement, agreeable to EOB, oriented to self and hospital only, decr safety/awareness of deficits        Exercises      Shoulder Instructions       General Comments      Pertinent Vitals/ Pain       Pain Assessment Pain Assessment: No/denies pain  Home Living                                          Prior Functioning/Environment              Frequency  Min 2X/week         Progress Toward Goals  OT Goals(current goals can now be found in the care plan section)  Progress towards OT goals: Progressing toward goals  Acute Rehab OT Goals Patient Stated Goal: to go home OT Goal Formulation: With patient Time For Goal Achievement: 05/22/23 Potential to Achieve Goals: Good  Plan Discharge plan remains appropriate;Frequency remains appropriate    Co-evaluation                 AM-PAC OT "6 Clicks" Daily Activity     Outcome Measure   Help from another person eating meals?: A Little Help from another person taking care of personal grooming?: A Little Help from another person toileting, which includes using toliet, bedpan, or urinal?: A Lot Help from another person bathing (including washing, rinsing, drying)?: A Lot Help from another person to put on and taking off regular upper body clothing?: A Little Help from another person to put on and taking off regular lower body clothing?: A Lot 6 Click Score: 15    End of Session    OT Visit Diagnosis: Other abnormalities of gait and mobility (R26.89);Unsteadiness on feet (R26.81);Muscle weakness (generalized) (M62.81)   Activity Tolerance Patient tolerated treatment well   Patient Left in bed;with call bell/phone within reach;with bed alarm set   Nurse Communication Other (comment) (nurse tech - pt took out IV, nurse tech noted she will let RN know)        Time: 1046-1100 OT Time Calculation (min): 14 min  Charges: OT General Charges $OT Visit: 1 Visit OT Treatments $Self Care/Home Management : 8-22 mins  Arman Filter., MPH, MS, OTR/L ascom [phone] 05/15/23, 11:12 AM

## 2023-05-15 NOTE — Progress Notes (Signed)
PROGRESS NOTE    [name]  ZOX:[id number] DOB: October 02, 1930 DOA: 05/07/2023 PCP: Marguarite Arbour, MD    Brief Narrative:  This 89 y.o. 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  Patient was complaining of cough and wheezing for few days to few weeks.  No any other symptoms.  Patient has peripheral neuropathy and ambulatory dysfunction as well. ED workup: Tachycardic heart rate 107, tachypneic respiratory rate 22, BP 162/70 saturating well on room air Labs : Hyponatremia sodium 128, blood glucose 105, calcium 8.6, BNP 573 elevated, troponin negative, procalcitonin negative, WBC within normal range, CT head negative for any acute findings, CXR: negative for pneumonia, CT chest:: Bibasilar dependent atelectasis. Lungs are otherwise clear.  COVID-negative, RVP pending [facility] hospitalist consulted for further management as below.  Assessment & Plan:   Principal Problem:   Acute hypoxic respiratory failure (HCC) Active Problems:   AMS (altered mental status)   Hyponatremia   Essential (primary) hypertension   Generalized weakness   Rheumatoid arthritis (HCC)   Hypothyroidism  # Acute respiratory distress secondary to influenza A viral infection: # COPD exacerbation due to influenza A viral infection: She presented with worsening / progressive shortness of breath. RVP positive for influenza A TTE LVEF 65 to 70%, no wall motion abnormality, no significant valvular abnormality. 5/30 CXR No acute cardiopulmonary abnormality.  Currently patient is saturating well on room air, uses supplemental oxygen as needed Patient was on Advair at home which was switched to [facility] inhaler during hospital stay S/p prednisone 40 mg p.o. daily for total 5 days S/p Lasix  40 mg IV one-time dose in the ED - continue tamiflu started 6/2   # Isotonic hyponatremia, serum osmole at 275 WNL Most likely due to nutritional deficiency. Started sodium chloride 1 g p.o. 3 times daily Sodium stable now low 130s   Delirium (altered mental status): Mild cognitive impairment and confusion was noticed on admission,  it could be secondary to influenza A viral infection and COPD exacerbation vs hyponatremia.  She has a history of delirium. Normal b12 in February of this year, normal TSH earlier this month CT head negative for any acute findings. Continue delirium precautions and supportive care. Much improved  Acute Cystitis Proetus growing in urine culture. Urine much more cloudy than on presentation. Does have a hx of cystitis-induced delirium - continue duricef   Hypertension: Controlled Continue propranolol 60 mg p.o. daily  Continue  amlodipine 5 mg p.o. daily Use hydralazine as needed Monitor BP and titrate medications accordingly   GERD: Continue PPI. Hypothyroidism:  Continue Synthroid Rheumatoid arthritis (HCC) No longer on DMARDs.  Not reporting any pain at this time.   Generalized weakness Family desires home with home health, toc assisting with additional care, home today or tomorrow pending TOC conversation with family   Body mass index is 25.89 kg/m.  Interventions:  DVT prophylaxis: Lovenox Code Status: Full code Family Communication: son updated telephonically 6/3   Status is: Inpatient    Consultants:  None  Procedures: None  Antimicrobials:  Anti-infectives (From admission, onward)    Start     Dose/Rate Route Frequency Ordered Stop   05/15/23 1000  oseltamivir (TAMIFLU) capsule 30 mg        30 mg Oral 2 times daily 05/14/23 1034 05/19/23 0959   05/15/23 1000  cefadroxil (DURICEF) capsule 500 mg        500 mg Oral 2 times daily 05/14/23 1515     05/14/23 1200  oseltamivir (TAMIFLU) capsule 75 mg        75 mg Oral  Once  05/14/23 1034 05/14/23 1204   05/14/23 1200  cefTRIAXone (ROCEPHIN) 1 g in sodium chloride 0.9 % 100 mL IVPB  Status:  Discontinued        1 g 200 mL/hr over 30 Minutes Intravenous Every 24 hours 05/14/23 1036 05/14/23 1515      Subjective: Calm, confused, no pain  Objective: Vitals:   05/14/23 1708 05/14/23 2206 05/15/23 0522 05/15/23 0819  BP: (!) 142/65 (!) 154/70 (!) 147/63 (!) 142/64  Pulse: 66 83 75 71  Resp: 20 18 18 15   Temp: 98.1 F (36.7 C) 98.4 F (36.9 C) 97.9 F (36.6 C) 97.8 F (36.6 C)  TempSrc: Oral     SpO2: 98% 96% 99% 97%  Weight:        Intake/Output Summary (Last 24 hours) at 05/15/2023 1110 Last data filed at 05/15/2023 0500 Gross per 24 hour  Intake 130.28 ml  Output 400 ml  Net -269.72 ml   Filed Weights   05/12/23 0500 05/13/23 0500 05/14/23 0500  Weight: 65.8 kg 66.1 kg 66.1 kg    Examination:  General exam: Appears calm and comfortable, deconditioned, not in any acute distress. Respiratory system: scattered exp wheeze Cardiovascular system: S1 & S2 heard, RRR. No JVD, murmurs, rubs, gallops or clicks. No pedal edema. Gastrointestinal system: Abdomen is soft, non tender, non distended, BS+ Central nervous system: Alert and oriented x 1. No focal neurological deficits. Extremities: No edema, no cyanosis, no clubbing Skin: No rashes, lesions or ulcers Psychiatry: pleasantly confused    Data Reviewed: I have personally reviewed following labs and imaging studies  CBC: Recent Labs  Lab 05/09/23 0543 05/10/23 0537 05/11/23 0533 05/12/23 0840 05/13/23 0934  WBC 9.6 10.4 9.7 14.7* 14.6*  HGB 13.0 13.6 13.6 14.3 13.9  HCT 38.5 39.7 40.0 41.7 41.1  MCV 97.0 95.9 95.5 95.9 97.2  PLT 161 185 172 204 207   Basic Metabolic Panel: Recent Labs  Lab 05/09/23 0543 05/10/23 0537 05/11/23 0533 05/12/23 0840 05/13/23 0934  NA 132* 135 130* 132* 130*  K 4.3 3.9 4.0 4.2 4.1  CL 94* 98 94* 95* 98  CO2 28 28 27 28 22   GLUCOSE 112* 92 82  107* 111*  BUN 25* 23 19 27* 27*  CREATININE 1.00 0.90 0.74 0.82 0.92  CALCIUM 8.8* 8.9 8.6* 8.8* 8.6*  MG 2.2 2.1 2.1  --   --   PHOS 3.9 2.5 3.3  --   --    GFR: Estimated Creatinine Clearance: 37.1 mL/min (by C-G formula based on SCr of 0.92 mg/dL). Liver Function Tests: No results for input(s): "AST", "ALT", "ALKPHOS", "BILITOT", "PROT", "ALBUMIN" in the last 168 hours.  No results for input(s): "LIPASE", "AMYLASE" in the last 168 hours. No results for input(s): "AMMONIA" in the last 168 hours. Coagulation Profile: No results for input(s): "INR", "PROTIME" in the last 168 hours. Cardiac Enzymes: No results for input(s): "CKTOTAL", "CKMB", "CKMBINDEX", "TROPONINI" in the last 168 hours. BNP (last 3 results) No results for input(s): "PROBNP" in the last 8760 hours. HbA1C: No results for input(s): "HGBA1C" in the last 72 hours. CBG: No results for input(s): "GLUCAP" in the last 168 hours. Lipid Profile: No results for input(s): "CHOL", "HDL", "LDLCALC", "TRIG", "CHOLHDL", "LDLDIRECT" in  the last 72 hours. Thyroid Function Tests: No results for input(s): "TSH", "T4TOTAL", "FREET4", "T3FREE", "THYROIDAB" in the last 72 hours. Anemia Panel: No results for input(s): "VITAMINB12", "FOLATE", "FERRITIN", "TIBC", "IRON", "RETICCTPCT" in the last 72 hours. Sepsis Labs: No results for input(s): "PROCALCITON", "LATICACIDVEN" in the last 168 hours.   Recent Results (from the past 240 hour(s))  SARS Coronavirus 2 by RT PCR (hospital order, performed in [facility] hospital lab) *cepheid single result test* Anterior Nasal Swab     Status: None   Collection Time: 05/07/23  1:40 PM   Specimen: Anterior Nasal Swab  Result Value Ref Range Status   SARS Coronavirus 2 by RT PCR NEGATIVE NEGATIVE Final    Comment: (NOTE) SARS-CoV-2 target nucleic acids are NOT DETECTED.  The SARS-CoV-2 RNA is generally detectable in upper and lower respiratory specimens during the acute phase of infection.  The lowest concentration of SARS-CoV-2 viral copies this assay can detect is 250 copies / mL. A negative result does not preclude SARS-CoV-2 infection and should not be used as the sole basis for treatment or other patient management decisions.  A negative result may occur with improper specimen collection / handling, submission of specimen other than nasopharyngeal swab, presence of viral mutation(s) within the areas targeted by this assay, and inadequate number of viral copies (<250 copies / mL). A negative result must be combined with clinical observations, patient history, and epidemiological information.  Fact Sheet for Patients:   RoadLapTop.co.za  Fact Sheet for Healthcare Providers: http://kim-miller.com/  This test is not yet approved or  cleared by the [place] FDA and has been authorized for detection and/or diagnosis of SARS-CoV-2 by FDA under an Emergency Use Authorization (EUA).  This EUA will remain in effect (meaning this test can be used) for the duration of the COVID-19 declaration under Section 564(b)(1) of the Act, 21 U.S.C. section 360bbb-3(b)(1), unless the authorization is terminated or revoked sooner.  Performed at [facility], [address] [place], [place] [address]   Respiratory (~20 pathogens) panel by PCR     Status: Abnormal   Collection Time: 05/08/23  5:05 AM   Specimen: Nasopharyngeal Swab; Respiratory  Result Value Ref Range Status   Adenovirus NOT DETECTED NOT DETECTED Final   Coronavirus 229E NOT DETECTED NOT DETECTED Final    Comment: (NOTE) The Coronavirus on the Respiratory Panel, DOES NOT test for the novel  Coronavirus (2019 nCoV)    Coronavirus HKU1 NOT DETECTED NOT DETECTED Final   Coronavirus NL63 NOT DETECTED NOT DETECTED Final   Coronavirus OC43 NOT DETECTED NOT DETECTED Final   Metapneumovirus NOT DETECTED NOT DETECTED Final   Rhinovirus / Enterovirus NOT DETECTED NOT  DETECTED Final   Influenza A H3 DETECTED (A) NOT DETECTED Final   Influenza B NOT DETECTED NOT DETECTED Final   Parainfluenza Virus 1 NOT DETECTED NOT DETECTED Final   Parainfluenza Virus 2 NOT DETECTED NOT DETECTED Final   Parainfluenza Virus 3 NOT DETECTED NOT DETECTED Final   Parainfluenza Virus 4 NOT DETECTED NOT DETECTED Final   Respiratory Syncytial Virus NOT DETECTED NOT DETECTED Final   Bordetella pertussis NOT DETECTED NOT DETECTED Final   Bordetella Parapertussis NOT DETECTED NOT DETECTED Final   Chlamydophila pneumoniae NOT DETECTED NOT DETECTED Final   Mycoplasma pneumoniae NOT DETECTED NOT DETECTED Final    Comment: Performed at [facility], [address] [place], [place] [address]  Urine Culture (for pregnant, neutropenic or urologic patients or patients with an indwelling urinary  catheter)     Status: Abnormal   Collection Time: 05/12/23  1:50 PM   Specimen: Urine, Clean Catch  Result Value Ref Range Status   Specimen Description   Final    URINE, CLEAN CATCH Performed at [facility], [address] [place], [place] [address]    Special Requests   Final    NONE Performed at [facility], [address] [place], [place] [address]    Culture >=100,000 COLONIES/mL PROTEUS MIRABILIS (A)  Final   Report Status 05/14/2023 FINAL  Final   Organism ID, Bacteria PROTEUS MIRABILIS (A)  Final      Susceptibility   Proteus mirabilis - MIC*    AMPICILLIN <=2 SENSITIVE Sensitive     CEFAZOLIN <=4 SENSITIVE Sensitive     CEFEPIME <=0.12 SENSITIVE Sensitive     CEFTRIAXONE <=0.25 SENSITIVE Sensitive     CIPROFLOXACIN <=0.25 SENSITIVE Sensitive     GENTAMICIN <=1 SENSITIVE Sensitive     IMIPENEM 1 SENSITIVE Sensitive     NITROFURANTOIN 128 RESISTANT Resistant     TRIMETH/SULFA <=20 SENSITIVE Sensitive     AMPICILLIN/SULBACTAM <=2 SENSITIVE Sensitive     PIP/TAZO <=4 SENSITIVE Sensitive     * >=100,000 COLONIES/mL PROTEUS MIRABILIS  Culture, blood  (Routine X 2) w Reflex to ID Panel     Status: None (Preliminary result)   Collection Time: 05/12/23  3:21 PM   Specimen: BLOOD  Result Value Ref Range Status   Specimen Description BLOOD RIGHT ANTECUBITAL  Final   Special Requests   Final    BOTTLES DRAWN AEROBIC AND ANAEROBIC Blood Culture adequate volume   Culture   Final    NO GROWTH 3 DAYS Performed at [facility], [address], [place], [place] [address]    Report Status PENDING  Incomplete  Culture, blood (Routine X 2) w Reflex to ID Panel     Status: None (Preliminary result)   Collection Time: 05/12/23  3:22 PM   Specimen: BLOOD  Result Value Ref Range Status   Specimen Description BLOOD LEFT ANTECUBITAL  Final   Special Requests   Final    BOTTLES DRAWN AEROBIC AND ANAEROBIC Blood Culture adequate volume   Culture   Final    NO GROWTH 3 DAYS Performed at [facility], [address] [place], [place] [address]    Report Status PENDING  Incomplete    Radiology Studies: No results found.  Scheduled Meds:  amLODipine  5 mg Oral Daily   cefadroxil  500 mg Oral BID   enoxaparin (LOVENOX) injection  40 mg Subcutaneous Q24H   fluticasone furoate-vilanterol  1 puff Inhalation Daily   guaiFENesin  600 mg Oral BID   levothyroxine  50 mcg Oral Q0600   oseltamivir  30 mg Oral BID   pantoprazole  40 mg Oral Daily   polyethylene glycol  17 g Oral Daily   propranolol ER  60 mg Oral Daily   sodium chloride flush  3 mL Intravenous Q12H   Continuous Infusions:   LOS: 8 days    Time spent: 30 mins    Silvano Bilis, MD [facility]   If 7PM-7AM, please contact night-coverage

## 2023-05-15 NOTE — Progress Notes (Signed)
Confirmed with Mont Dutton, infection prevention nurse that droplet isolation can be discontinued for patient who tested positive for flu A

## 2023-05-15 NOTE — Progress Notes (Signed)
Patient yelling out, attempting to get out of bed and being physically aggressive with staff. Attempts at re-direction made without success. Unable to reach son, Minerva Areola via phone. Pt medicated with PRN haldol per order. Pt continued to be aggressive, attempting to hit, kick, bite and scratch staff members. Pt also spitting at staff. Bilat mitts applied to prevent removal of IV lines, etc.

## 2023-05-15 NOTE — Progress Notes (Signed)
Physical Therapy Treatment Patient Details Name: [name] MRN: [id number] DOB: 11-Jun-1930 Today's Date: 05/15/2023   History of Present Illness [name] is a 89 y.o. 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  MD assessment includes:  acute hypoxic respiratory failure, AMS, hyponatremia, and generalized weakness    PT Comments    Patient had received Haldol earlier this afternoon prior to PT session. She was confused but cooperative during PT session today. She participated with seated level therapeutic exercises. She asked to get in the chair but then declined to attempt standing due to fear of falling. She continues to require physical assistance with bed mobility with fair sitting balance. She will need assistance to get OOB when she is willing to attempt transfers. Anticipated the need for frequent supervision/assistance at discharge. PT will continue to follow in attempts to maximize independence and decrease caregiver burden.    Recommendations for follow up therapy are one component of a multi-disciplinary discharge planning process, led by the attending physician.  Recommendations may be updated based on patient status, additional functional criteria and insurance authorization.  Follow Up Recommendations  Can patient physically be transported by private vehicle: No    Assistance Recommended at Discharge Frequent or constant Supervision/Assistance  Patient can return home with the following Two people to help with walking and/or transfers;A lot of help with bathing/dressing/bathroom;Assistance with cooking/housework;Direct supervision/assist for medications management;Assist for  transportation;Help with stairs or ramp for entrance   Equipment Recommendations  None recommended by PT    Recommendations for Other Services       Precautions / Restrictions Precautions Precautions: Fall Restrictions Weight Bearing Restrictions: No     Mobility  Bed Mobility Overal bed mobility: Needs Assistance Bed Mobility: Supine to Sit, Sit to Supine     Supine to sit: Min guard Sit to supine: Supervision   General bed mobility comments: assistance for trunk support to sit upright. she requested to perform mobility without therapist assistance when able. cues for technique    Transfers                   General transfer comment: patient requesting to get to the chair but refused to stand due to fear of falling. she required maximal assistance to perform incremental scooting to the left in sitting with cues for technique    Ambulation/Gait                   Stairs             Wheelchair Mobility    Modified Rankin (Stroke Patients Only)       Balance Overall balance assessment: Needs assistance Sitting-balance support: Feet supported, No upper extremity supported Sitting balance-Leahy Scale: Fair Sitting balance - Comments: no loss of balance                                    Cognition Arousal/Alertness: Awake/alert Behavior During Therapy: WFL for tasks assessed/performed Overall Cognitive Status: No family/caregiver present to determine baseline cognitive functioning  General Comments: patient was medicated with haldol prior to PT session. patient is confused but was cooperative during session. bilateral hand mitts removed during session as patient was calm and cooperative. she requested to leave mitts off- alerted RN who is in agreement to leave off for now.        Exercises General Exercises - Lower Extremity Long Arc Quad: AROM, Strengthening, Both, 5 reps,  Seated Other Exercises Other Exercises: visual cues for exercise technique    General Comments        Pertinent Vitals/Pain Pain Assessment Pain Assessment: No/denies pain    Home Living                          Prior Function            PT Goals (current goals can now be found in the care plan section) Acute Rehab PT Goals Patient Stated Goal: to go home PT Goal Formulation: With patient Time For Goal Achievement: 05/21/23 Potential to Achieve Goals: Fair Progress towards PT goals: Progressing toward goals    Frequency    Min 2X/week      PT Plan Current plan remains appropriate    Co-evaluation              AM-PAC PT "6 Clicks" Mobility   Outcome Measure  Help needed turning from your back to your side while in a flat bed without using bedrails?: A Little Help needed moving from lying on your back to sitting on the side of a flat bed without using bedrails?: A Little Help needed moving to and from a bed to a chair (including a wheelchair)?: Total Help needed standing up from a chair using your arms (e.g., wheelchair or bedside chair)?: Total Help needed to walk in hospital room?: Total Help needed climbing 3-5 steps with a railing? : Total 6 Click Score: 10    End of Session   Activity Tolerance: Patient tolerated treatment well Patient left: in bed;with call bell/phone within reach;with bed alarm set (mitts were left off with nurse aware, patient calm and resting in bed) Nurse Communication: Mobility status PT Visit Diagnosis: Unsteadiness on feet (R26.81);History of falling (Z91.81);Difficulty in walking, not elsewhere classified (R26.2);Muscle weakness (generalized) (M62.81)     Time: 1610-9604 PT Time Calculation (min) (ACUTE ONLY): 18 min  Charges:  $Therapeutic Activity: 8-22 mins                     Donna Bernard, PT, MPT    Ina Homes 05/15/2023, 3:33 PM

## 2023-05-16 DIAGNOSIS — J9601 Acute respiratory failure with hypoxia: Secondary | ICD-10-CM | POA: Diagnosis not present

## 2023-05-16 LAB — CULTURE, BLOOD (ROUTINE X 2)
Culture: NO GROWTH
Special Requests: ADEQUATE

## 2023-05-16 MED ORDER — OSELTAMIVIR PHOSPHATE 30 MG PO CAPS: 30.0000 mg | ORAL_CAPSULE | Freq: Two times a day (BID) | ORAL | 0 refills | Status: DC

## 2023-05-16 MED ORDER — CEFADROXIL 500 MG PO CAPS: 500.0000 mg | ORAL_CAPSULE | Freq: Two times a day (BID) | ORAL | 0 refills | Status: DC

## 2023-05-16 NOTE — Care Management Important Message (Signed)
Important Message  Patient Details  Name: NOSHIN DEGRAZIA MRN: [id number] Date of Birth: February 13, 1930   Medicare Important Message Given:  Yes  I reviewed the Important Message from Medicare with the patient and her HCPOA, son Minerva Areola and obtained a signature. Will send form to Health Information Management to be scanned into the patients medical record. I wished her a speedy recovery and thanked them both for their time.   Olegario Messier A Jaun Galluzzo 05/16/2023, 11:08 AM

## 2023-05-16 NOTE — Progress Notes (Signed)
Received Md order to discharge patient to home, I reviewed discharge instructions, home meds, prescriptions and follow up appointments with patient and son Tilla Bridson and both verbalized understanding.

## 2023-05-16 NOTE — TOC Transition Note (Addendum)
Transition of Care [facility]) - CM/SW Discharge Note   Patient Details  Name: [name] MRN: [id number] Date of Birth: [birth year]  Transition of Care [facility] CM/SW Contact:  Allena Katz, LCSW Phone Number: 05/16/2023, 11:58 AM   Clinical Narrative:   Pt has orders to discharge home. Family has care through friends during the day and have alerted them to be available as patient is being discharged today. Pt has [facility] care lined up for today. RN will come out to do admission assessment at 3 and will start care tonight at 8. Verlon Au with [facility] notified. Minerva Areola will be here around 1 and is agreeable to [facility] to be called. Medical Necessity printed to the unit. Minerva Areola has reported pt has a wheelchair and 3IN1 at home and states no additional DME is needed. Barbara Cower with [facility] notified of discharge home with [facility] PT/OT/RN/SW Start of care 05/17/2023.  CSW signing off.     Final next level of care: Home w Home Health Services Barriers to Discharge: Barriers Resolved   Patient Goals and CMS Choice CMS Medicare.gov Compare Post Acute Care list provided to:: Patient Represenative (must comment) (ERIC) Choice offered to / list presented to : [facility] POA / Guardian  Discharge Placement                    Name of family member notified: Minerva Areola Patient and family notified of of transfer: 05/16/23  Discharge Plan and Services Additional resources added to the After Visit Summary for                              [facility] Agency: [facility] Date [facility] Agency Contacted: 05/09/23 Time HH Agency Contacted: 1100 Representative spoke with at [facility] Agency: Barbara Cower  Social Determinants of Health (SDOH) Interventions SDOH Screenings   Food Insecurity: No Food Insecurity (05/07/2023)  Housing: Patient Declined (05/07/2023)  Transportation Needs: No Transportation Needs (05/07/2023)  Utilities: Not At Risk (05/07/2023)  Tobacco Use: Low Risk  (05/07/2023)     Readmission Risk  Interventions    07/25/2021   10:49 AM  Readmission Risk Prevention Plan  Transportation Screening Complete  PCP or Specialist Appt within 5-7 Days Complete  Home Care Screening Complete  Medication Review (RN CM) Complete

## 2023-05-16 NOTE — Discharge Summary (Signed)
[name]:[id number] DOB: [birth year] DOA: 05/07/2023  PCP: Marguarite Arbour, MD  Admit date: 05/07/2023 Discharge date: 05/16/2023  Time spent: 35 minutes  Recommendations for Outpatient Follow-up:  Pcp f/u, bmp then    Discharge Diagnoses:  Principal Problem:   Acute hypoxic respiratory failure (HCC) Active Problems:   AMS (altered mental status)   Hyponatremia   Essential (primary) hypertension   Generalized weakness   Rheumatoid arthritis (HCC)   Hypothyroidism   Discharge Condition: stable  Diet recommendation: heart healthy  Filed Weights   05/13/23 0500 05/14/23 0500 05/16/23 0614  Weight: 66.1 kg 66.1 kg 62.6 kg    History of present illness:  From admission h and p  [name] is a 89 y.o. 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   History predominantly obtained through chart review due to patient's altered mental status.  Patient tells me that she has a cough that started today and she has been wheezing but is unsure for how long.  She denies any fevers, chills, nausea, vomiting, chest pain.   Per chart review, over the last few days, patient's son has noticed that [name] has been having increasing shortness of breath.  Today, he noted that she was wheezing a lot and had a cough.  Over the last couple days she has had some urinary incontinence while coughing.  Today, he is also noted increased generalized weakness and confusion compared to baseline, stating she can usually walk with a walker but cannot do so today.  Hospital Course:   # Acute respiratory distress secondary to influenza A viral infection: # COPD exacerbation due to influenza A viral infection: She presented with worsening / progressive shortness of  breath. RVP positive for influenza A TTE LVEF 65 to 70%, no wall motion abnormality, no significant valvular abnormality. 5/30 CXR No acute cardiopulmonary abnormality.  Currently patient is saturating well on room air, uses supplemental oxygen as needed Patient was on Advair at home which was switched to [facility] inhaler during hospital stay S/p prednisone 40 mg p.o. daily for total 5 days S/p Lasix 40 mg IV one-time dose in the ED - continue tamiflu started 6/2   # Isotonic hyponatremia, serum osmole at 275 WNL Most likely due to nutritional deficiency. Started sodium chloride 1 g p.o. 3 times daily Sodium stable now low 130s - attention to this at pcp f/u   Delirium (altered mental status): Mild cognitive impairment and confusion was noticed on admission,  it could be secondary to influenza A viral infection and COPD exacerbation vs hyponatremia.  She has a history of delirium. Normal b12 in February of this year, normal TSH earlier this month CT head negative for any acute findings. Delirium resolved   Acute Cystitis Proetus growing in urine culture. Urine much more cloudy than on presentation. Does have a hx of cystitis-induced delirium - continue duricef for 5 day course   Hypertension: Controlled off home ccb/acei   GERD: Continue PPI. Hypothyroidism:  Continue Synthroid Rheumatoid arthritis (HCC) No longer on DMARDs.  Not reporting any pain at this time.   Generalized weakness Family desires home with home health, ordered. They now have 24/7 care at home  Procedures: none   Consultations: none  Discharge Exam: Vitals:   05/16/23 0556 05/16/23 0856  BP: (!) 124/57  116/60  Pulse: 72 70  Resp: 18 18  Temp: 98.2 F (36.8 C) 98.2 F (36.8 C)  SpO2: 97% 97%    General exam: Appears calm and comfortable, deconditioned, not in any acute distress. Respiratory system: scattered exp wheeze Cardiovascular system: S1 & S2 heard, RRR. No JVD, murmurs, rubs,  gallops or clicks. No pedal edema. Gastrointestinal system: Abdomen is soft, non tender, non distended, BS+ Central nervous system: Alert and oriented x 1. No focal neurological deficits. Extremities: No edema, no cyanosis, no clubbing Skin: No rashes, lesions or ulcers Psychiatry: pleasantly confused  Discharge Instructions   Discharge Instructions     Diet - low sodium heart healthy   Complete by: As directed    Increase activity slowly   Complete by: As directed       Allergies as of 05/16/2023       Reactions   Codeine Nausea And Vomiting   Doxycycline Other (See Comments)   Flu-like s/s   Morphine Nausea And Vomiting, Anxiety   Hallucinations and anxiety   Tramadol Nausea And Vomiting, Other (See Comments)   Causes poor balance   Anakinra    Celecoxib    Hydroxychloroquine    Other reaction(s): Unknown   Adhesive [tape] Rash   Amitriptyline Other (See Comments)   Caused nervousness. Patient states she has never taken this.   Ciprofloxacin Diarrhea   Clarithromycin Nausea Only   Flu like symptoms   Erythromycin Rash   Itraconazole Other (See Comments)   Elevated liver enzymes   Nsaids Nausea Only   Unknown   Penicillins Rash   Hives also. Doctor told her to never ever take pcn unless it is her only option   Zoloft [sertraline Hcl] Other (See Comments)   Patient unaware of any reaction. Probably upset stomach        Medication List     STOP taking these medications    amLODipine 5 MG tablet Commonly known as: NORVASC   benazepril 10 MG tablet Commonly known as: LOTENSIN       TAKE these medications    acetaminophen 500 MG tablet Commonly known as: TYLENOL Take 250 mg by mouth 2 (two) times daily.   albuterol 108 (90 Base) MCG/ACT inhaler Commonly known as: VENTOLIN HFA Inhale 2 puffs into the lungs every 6 (six) hours as needed.   ALPRAZolam 0.25 MG tablet Commonly known as: XANAX Take 1 tablet (0.25 mg total) by mouth 2 (two) times daily  as needed for anxiety.   Calcium-D 600-400 MG-UNIT Tabs Take 1 tablet by mouth 2 (two) times daily.   CALMME PO Take 5 mLs by mouth at bedtime. Magnesium 1 tsp at night (dissolved in 4 oz. Water)   cefadroxil 500 MG capsule Commonly known as: DURICEF Take 1 capsule (500 mg total) by mouth 2 (two) times daily.   clobetasol cream 0.05 % Commonly known as: TEMOVATE Apply 1 Application topically 2 (two) times daily.   cyanocobalamin 1000 MCG/ML injection Commonly known as: VITAMIN B12 Inject 1,000 mcg into the muscle every 30 (thirty) days.   estradiol 0.1 MG/GM vaginal cream Commonly known as: ESTRACE Place 1 Applicatorful vaginally at bedtime as needed (for vaginal discomfort).   FIBER-CAPS PO Take 1 capsule by mouth at bedtime.   fluticasone-salmeterol 250-50 MCG/ACT Aepb Commonly known as: ADVAIR Inhale 1 puff into the lungs in the morning and at bedtime.   folic acid 1 MG tablet Commonly known as: FOLVITE Take 1 mg by mouth daily.   hydrocortisone  2.5 % cream Apply 1 Application topically 2 (two) times daily.   ketoconazole 2 % cream Commonly known as: NIZORAL Apply 1 Application topically daily.   levothyroxine 50 MCG tablet Commonly known as: SYNTHROID Take 50 mcg by mouth daily.   multivitamin tablet Take 1 tablet by mouth daily.   mupirocin ointment 2 % Commonly known as: BACTROBAN Apply 1 Application topically 2 (two) times daily.   omeprazole 40 MG capsule Commonly known as: PRILOSEC Take 40 mg by mouth daily before breakfast.   ondansetron 4 MG tablet Commonly known as: ZOFRAN Take 4 mg by mouth every 8 (eight) hours as needed for nausea or vomiting.   oseltamivir 30 MG capsule Commonly known as: TAMIFLU Take 1 capsule (30 mg total) by mouth 2 (two) times daily.   PHILLIPS COLON HEALTH PO Take 1 tablet by mouth daily.   propranolol ER 60 MG 24 hr capsule Commonly known as: INDERAL LA Take 60 mg by mouth daily.   vitamin C 1000 MG  tablet Take 1,000 mg by mouth daily.   Vitamin D-3 125 MCG (5000 UT) Tabs Take 5,000 Units by mouth daily.       Allergies  Allergen Reactions   Codeine Nausea And Vomiting   Doxycycline Other (See Comments)    Flu-like s/s   Morphine Nausea And Vomiting and Anxiety    Hallucinations and anxiety   Tramadol Nausea And Vomiting and Other (See Comments)    Causes poor balance   Anakinra    Celecoxib    Hydroxychloroquine     Other reaction(s): Unknown   Adhesive [Tape] Rash   Amitriptyline Other (See Comments)    Caused nervousness. Patient states she has never taken this.   Ciprofloxacin Diarrhea   Clarithromycin Nausea Only    Flu like symptoms   Erythromycin Rash   Itraconazole Other (See Comments)    Elevated liver enzymes   Nsaids Nausea Only    Unknown   Penicillins Rash    Hives also. Doctor told her to never ever take pcn unless it is her only option   Zoloft [Sertraline Hcl] Other (See Comments)    Patient unaware of any reaction. Probably upset stomach    Follow-up Information     Sparks, Duane Lope, MD Follow up.   Specialty: Internal Medicine Contact information: [address] [place] [address] [phone]                  The results of significant diagnostics from this hospitalization (including imaging, microbiology, ancillary and laboratory) are listed below for reference.    Significant Diagnostic Studies: DG Chest Port 1 View  Result Date: 05/11/2023 CLINICAL DATA:  89 year old female with shortness of breath and altered mental status. EXAM: PORTABLE CHEST 1 VIEW COMPARISON:  CT chest 05/07/2023 and earlier. FINDINGS: Portable AP upright view at 0933 hours. Stable lung volumes. Mediastinal contours are stable, within normal limits. Stable left chest dual lead pacemaker. Partially visible cervical and thoracolumbar fusion hardware. Allowing for portable technique the lungs are clear. No pneumothorax or pleural effusion. Negative  visible bowel gas. Stable visualized osseous structures. IMPRESSION: No acute cardiopulmonary abnormality. Electronically Signed   By: Odessa Fleming M.D.   On: 05/11/2023 09:46   ECHOCARDIOGRAM COMPLETE  Result Date: 05/09/2023    ECHOCARDIOGRAM REPORT   Patient Name:   ATLANTIS BINI Date of Exam: 05/08/2023 Medical Rec #:  [id number]      Height:       63.0 in Accession #:  [id number]     Weight:       146.2 lb Date of Birth:  25-Sep-1930      BSA:          1.692 m Patient Age:    89 years       BP:           139/68 mmHg Patient Gender: F              HR:           79 bpm. Exam Location:  [facility] Procedure: 2D Echo and Intracardiac Opacification Agent Indications:     CHF I50.31  History:         Patient has no prior history of Echocardiogram examinations.  Sonographer:     Overton Mam RDCS, FASE Referring Phys:  [id number] Verdene Lennert Diagnosing Phys: Yvonne Kendall MD  Sonographer Comments: Technically difficult study due to poor echo windows, suboptimal apical window and no subcostal window. Image acquisition challenging due to uncooperative patient and Image acquisition challenging due to respiratory motion. IMPRESSIONS  1. Left ventricular ejection fraction, by estimation, is 65 to 70%. The left ventricle has normal function. The left ventricle has no regional wall motion abnormalities. Left ventricular diastolic function could not be evaluated.  2. Pulmonary artery pressure is at least mildly elevated (RVSP 30-35 mmHg plus central venous pressure). Right ventricular systolic function is normal. The right ventricular size is normal.  3. Left atrial size was mildly dilated.  4. The mitral valve is abnormal. Trivial mitral valve regurgitation. There appears to be at least moderate mitral valve stenosis based on pressure gradient, though evaluation is limited mitral stenosis. The mean mitral valve gradient is 9.0 mmHg. Severe  mitral annular calcification.  5. The aortic valve has an indeterminant number of  cusps. There is mild thickening of the aortic valve. Aortic valve regurgitation is not visualized. Aortic valve sclerosis is present, with no evidence of aortic valve stenosis.  6. Pulmonic valve regurgitation not well assessed. FINDINGS  Left Ventricle: Left ventricular ejection fraction, by estimation, is 65 to 70%. The left ventricle has normal function. The left ventricle has no regional wall motion abnormalities. Definity contrast agent was given IV to delineate the left ventricular  endocardial borders. The left ventricular internal cavity size was normal in size. There is no left ventricular hypertrophy. Left ventricular diastolic function could not be evaluated due to mitral stenosis. Left ventricular diastolic function could not  be evaluated. Right Ventricle: Pulmonary artery pressure is at least mildly elevated (RVSP 30-35 mmHg plus central venous pressure). The right ventricular size is normal. No increase in right ventricular wall thickness. Right ventricular systolic function is normal. Left Atrium: Left atrial size was mildly dilated. Right Atrium: Right atrial size was normal in size. Pericardium: The pericardium was not well visualized. Mitral Valve: The mitral valve is abnormal. There is severe thickening of the mitral valve leaflet(s). There is moderate calcification of the mitral valve leaflet(s). Severe mitral annular calcification. Trivial mitral valve regurgitation. There appears to be at least moderate mitral valve stenosis based on pressure gradient, though evaluation is limited mitral valve stenosis. MV peak gradient, 21.3 mmHg. The mean mitral valve gradient is 9.0 mmHg. Tricuspid Valve: The tricuspid valve is grossly normal. Tricuspid valve regurgitation is mild. Aortic Valve: The aortic valve has an indeterminant number of cusps. There is mild thickening of the aortic valve. Aortic valve regurgitation is not visualized. Aortic valve sclerosis is present, with no evidence of aortic valve  stenosis. Aortic valve mean gradient measures 7.0 mmHg. Aortic valve peak gradient measures 13.7 mmHg. Aortic valve area, by VTI measures 1.38 cm. Pulmonic Valve: The pulmonic valve was not well visualized. Pulmonic valve regurgitation not well assessed. Aorta: The aortic root and ascending aorta are structurally normal, with no evidence of dilitation. Pulmonary Artery: The pulmonary artery is not well seen. IAS/Shunts: The interatrial septum was not well visualized. Additional Comments: A device lead is visualized.  LEFT VENTRICLE PLAX 2D LVIDd:         3.80 cm   Diastology LVIDs:         2.40 cm   LV e' medial:    4.13 cm/s LV PW:         1.10 cm   LV E/e' medial:  29.5 LV IVS:        1.20 cm   LV e' lateral:   8.59 cm/s LVOT diam:     1.80 cm   LV E/e' lateral: 14.2 LV SV:         42 LV SV Index:   25 LVOT Area:     2.54 cm  RIGHT VENTRICLE RV Basal diam:  4.00 cm RV S prime:     17.80 cm/s LEFT ATRIUM           Index        RIGHT ATRIUM           Index LA diam:      3.80 cm 2.25 cm/m   RA Area:     17.10 cm LA Vol (A4C): 60.0 ml 35.45 ml/m  RA Volume:   51.80 ml  30.61 ml/m  AORTIC VALVE                     PULMONIC VALVE AV Area (Vmax):    1.33 cm      PV Vmax:        0.71 m/s AV Area (Vmean):   1.15 cm      PV Peak grad:   2.0 mmHg AV Area (VTI):     1.38 cm      RVOT Peak grad: 2 mmHg AV Vmax:           185.00 cm/s AV Vmean:          128.000 cm/s AV VTI:            0.307 m AV Peak Grad:      13.7 mmHg AV Mean Grad:      7.0 mmHg LVOT Vmax:         96.50 cm/s LVOT Vmean:        57.800 cm/s LVOT VTI:          0.166 m LVOT/AV VTI ratio: 0.54  AORTA Ao Root diam: 3.20 cm Ao Asc diam:  2.90 cm MITRAL VALVE                TRICUSPID VALVE MV Area (PHT): 2.79 cm     TR Peak grad:   32.3 mmHg MV Area VTI:   0.71 cm     TR Vmax:        284.00 cm/s MV Peak grad:  21.3 mmHg MV Mean grad:  9.0 mmHg     SHUNTS MV Vmax:       2.31 m/s     Systemic VTI:  0.17 m MV Vmean:      137.0 cm/s   Systemic Diam: 1.80 cm MV  Decel Time: 272 msec  MV E velocity: 122.00 cm/s MV A velocity: 203.00 cm/s MV E/A ratio:  0.60 Yvonne Kendall MD Electronically signed by Yvonne Kendall MD Signature Date/Time: 05/09/2023/6:56:23 AM    Final (Updated)    CT CHEST WO CONTRAST  Result Date: 05/07/2023 CLINICAL DATA:  Respiratory illness.  Shortness of breath. EXAM: CT CHEST WITHOUT CONTRAST TECHNIQUE: Multidetector CT imaging of the chest was performed following the standard protocol without IV contrast. RADIATION DOSE REDUCTION: This exam was performed according to the departmental dose-optimization program which includes automated exposure control, adjustment of the mA and/or kV according to patient size and/or use of iterative reconstruction technique. COMPARISON:  Chest radiograph performed earlier on the same date. FINDINGS: Cardiovascular: Heart is normal in size. Prominent coronary artery atherosclerotic calcifications. No pericardial effusion. Aorta is normal in caliber with atherosclerotic calcification of the aortic arch and a tortuous course. Pacemaker leads in the right atrium and right ventricle. Mediastinum/Nodes: Subcentimeter right paratracheal and prevascular lymph nodes, likely reactive. Thyroid, trachea and esophagus demonstrate no significant abnormality. Lungs/Pleura: Bibasilar dependent atelectasis. Mild centrilobular emphysematous changes. Lungs are otherwise clear. No pleural effusion or pneumothorax. Upper Abdomen: Cholelithiasis without evidence of acute cholecystitis. Musculoskeletal: Osteopenia and moderate kyphosis. Posterior spinal fusion at thoracolumbar junction. Partially imaged hardware of the lower cervical spine. IMPRESSION: 1. Bibasilar dependent atelectasis. Lungs are otherwise clear. 2. Prominent coronary artery atherosclerotic calcifications. 3. Cholelithiasis without evidence of acute cholecystitis. 4. Osteopenia and moderate kyphosis. Posterior spinal fusion at thoracolumbar junction. 5. Aortic  atherosclerosis. 6. Emphysema. Aortic Atherosclerosis (ICD10-I70.0) and Emphysema (ICD10-J43.9). Electronically Signed   By: Larose Hires D.O.   On: 05/07/2023 17:17   CT HEAD WO CONTRAST ( )  Result Date: 05/07/2023 CLINICAL DATA:  Headache. EXAM: CT HEAD WITHOUT CONTRAST TECHNIQUE: Contiguous axial images were obtained from the base of the skull through the vertex without intravenous contrast. RADIATION DOSE REDUCTION: This exam was performed according to the departmental dose-optimization program which includes automated exposure control, adjustment of the mA and/or kV according to patient size and/or use of iterative reconstruction technique. COMPARISON:  CT head dated November 23, 2022. FINDINGS: Brain: No evidence of acute infarction, hemorrhage, hydrocephalus, extra-axial collection or mass lesion/mass effect. Stable moderate atrophy and chronic microvascular ischemic changes. Vascular: Calcified atherosclerosis at the skull base. No hyperdense vessel. Skull: Normal. Negative for fracture or focal lesion. Sinuses/Orbits: No acute finding. Chronic complete opacification of the left sphenoid sinus. Other: None. IMPRESSION: 1. No acute intracranial abnormality. Stable moderate atrophy and chronic microvascular ischemic changes. Electronically Signed   By: Obie Dredge M.D.   On: 05/07/2023 15:42   DG Chest 2 View  Result Date: 05/07/2023 CLINICAL DATA:  Worsening shortness of breath and weakness over the past few days. EXAM: CHEST - 2 VIEW COMPARISON:  Chest x-ray dated November 23, 2022. FINDINGS: Unchanged left chest wall pacemaker. The heart size and mediastinal contours are within normal limits. Normal pulmonary vascularity. No focal consolidation, pleural effusion, or pneumothorax. No acute osseous abnormality. Prior T12-L5 posterior fusion. IMPRESSION: No active cardiopulmonary disease. Electronically Signed   By: Obie Dredge M.D.   On: 05/07/2023 13:18    Microbiology: Recent Results  (from the past 240 hour(s))  SARS Coronavirus 2 by RT PCR (hospital order, performed in [facility] hospital lab) *cepheid single result test* Anterior Nasal Swab     Status: None   Collection Time: 05/07/23  1:40 PM   Specimen: Anterior Nasal Swab  Result Value Ref Range Status   SARS Coronavirus 2 by RT PCR  NEGATIVE NEGATIVE Final    Comment: (NOTE) SARS-CoV-2 target nucleic acids are NOT DETECTED.  The SARS-CoV-2 RNA is generally detectable in upper and lower respiratory specimens during the acute phase of infection. The lowest concentration of SARS-CoV-2 viral copies this assay can detect is 250 copies / mL. A negative result does not preclude SARS-CoV-2 infection and should not be used as the sole basis for treatment or other patient management decisions.  A negative result may occur with improper specimen collection / handling, submission of specimen other than nasopharyngeal swab, presence of viral mutation(s) within the areas targeted by this assay, and inadequate number of viral copies (<250 copies / mL). A negative result must be combined with clinical observations, patient history, and epidemiological information.  Fact Sheet for Patients:   RoadLapTop.co.za  Fact Sheet for Healthcare Providers: http://kim-miller.com/  This test is not yet approved or  cleared by the [place] FDA and has been authorized for detection and/or diagnosis of SARS-CoV-2 by FDA under an Emergency Use Authorization (EUA).  This EUA will remain in effect (meaning this test can be used) for the duration of the COVID-19 declaration under Section 564(b)(1) of the Act, 21 U.S.C. section 360bbb-3(b)(1), unless the authorization is terminated or revoked sooner.  Performed at [facility], [address] [place], [place] [address]   Respiratory (~20 pathogens) panel by PCR     Status: Abnormal   Collection Time: 05/08/23  5:05 AM    Specimen: Nasopharyngeal Swab; Respiratory  Result Value Ref Range Status   Adenovirus NOT DETECTED NOT DETECTED Final   Coronavirus 229E NOT DETECTED NOT DETECTED Final    Comment: (NOTE) The Coronavirus on the Respiratory Panel, DOES NOT test for the novel  Coronavirus (2019 nCoV)    Coronavirus HKU1 NOT DETECTED NOT DETECTED Final   Coronavirus NL63 NOT DETECTED NOT DETECTED Final   Coronavirus OC43 NOT DETECTED NOT DETECTED Final   Metapneumovirus NOT DETECTED NOT DETECTED Final   Rhinovirus / Enterovirus NOT DETECTED NOT DETECTED Final   Influenza A H3 DETECTED (A) NOT DETECTED Final   Influenza B NOT DETECTED NOT DETECTED Final   Parainfluenza Virus 1 NOT DETECTED NOT DETECTED Final   Parainfluenza Virus 2 NOT DETECTED NOT DETECTED Final   Parainfluenza Virus 3 NOT DETECTED NOT DETECTED Final   Parainfluenza Virus 4 NOT DETECTED NOT DETECTED Final   Respiratory Syncytial Virus NOT DETECTED NOT DETECTED Final   Bordetella pertussis NOT DETECTED NOT DETECTED Final   Bordetella Parapertussis NOT DETECTED NOT DETECTED Final   Chlamydophila pneumoniae NOT DETECTED NOT DETECTED Final   Mycoplasma pneumoniae NOT DETECTED NOT DETECTED Final    Comment: Performed at [facility], [address], [place], [place] [address]  Urine Culture (for pregnant, neutropenic or urologic patients or patients with an indwelling urinary catheter)     Status: Abnormal   Collection Time: 05/12/23  1:50 PM   Specimen: Urine, Clean Catch  Result Value Ref Range Status   Specimen Description   Final    URINE, CLEAN CATCH Performed at [facility], [address], [place], [place] [address]    Special Requests   Final    NONE Performed at [facility], [address] [place], [place] [address]    Culture >=100,000 COLONIES/mL PROTEUS MIRABILIS (A)  Final   Report Status 05/14/2023 FINAL  Final   Organism ID, Bacteria PROTEUS MIRABILIS (A)  Final      Susceptibility    Proteus mirabilis -  MIC*    AMPICILLIN <=2 SENSITIVE Sensitive     CEFAZOLIN <=4 SENSITIVE Sensitive     CEFEPIME <=0.12 SENSITIVE Sensitive     CEFTRIAXONE <=0.25 SENSITIVE Sensitive     CIPROFLOXACIN <=0.25 SENSITIVE Sensitive     GENTAMICIN <=1 SENSITIVE Sensitive     IMIPENEM 1 SENSITIVE Sensitive     NITROFURANTOIN 128 RESISTANT Resistant     TRIMETH/SULFA <=20 SENSITIVE Sensitive     AMPICILLIN/SULBACTAM <=2 SENSITIVE Sensitive     PIP/TAZO <=4 SENSITIVE Sensitive     * >=100,000 COLONIES/mL PROTEUS MIRABILIS  Culture, blood (Routine X 2) w Reflex to ID Panel     Status: None (Preliminary result)   Collection Time: 05/12/23  3:21 PM   Specimen: BLOOD  Result Value Ref Range Status   Specimen Description BLOOD RIGHT ANTECUBITAL  Final   Special Requests   Final    BOTTLES DRAWN AEROBIC AND ANAEROBIC Blood Culture adequate volume   Culture   Final    NO GROWTH 4 DAYS Performed at [facility], [address], [place], [place] [address]    Report Status PENDING  Incomplete  Culture, blood (Routine X 2) w Reflex to ID Panel     Status: None (Preliminary result)   Collection Time: 05/12/23  3:22 PM   Specimen: BLOOD  Result Value Ref Range Status   Specimen Description BLOOD LEFT ANTECUBITAL  Final   Special Requests   Final    BOTTLES DRAWN AEROBIC AND ANAEROBIC Blood Culture adequate volume   Culture   Final    NO GROWTH 4 DAYS Performed at [facility], [address] [place], [place] [address]    Report Status PENDING  Incomplete     Labs: Basic Metabolic Panel: Recent Labs  Lab 05/10/23 0537 05/11/23 0533 05/12/23 0840 05/13/23 0934  NA 135 130* 132* 130*  K 3.9 4.0 4.2 4.1  CL 98 94* 95* 98  CO2 28 27 28 22   GLUCOSE 92 82 107* 111*  BUN 23 19 27* 27*  CREATININE 0.90 0.74 0.82 0.92  CALCIUM 8.9 8.6* 8.8* 8.6*  MG 2.1 2.1  --   --   PHOS 2.5 3.3  --   --    Liver Function Tests: No results for input(s): "AST", "ALT", "ALKPHOS",  "BILITOT", "PROT", "ALBUMIN" in the last 168 hours. No results for input(s): "LIPASE", "AMYLASE" in the last 168 hours. No results for input(s): "AMMONIA" in the last 168 hours. CBC: Recent Labs  Lab 05/10/23 0537 05/11/23 0533 05/12/23 0840 05/13/23 0934  WBC 10.4 9.7 14.7* 14.6*  HGB 13.6 13.6 14.3 13.9  HCT 39.7 40.0 41.7 41.1  MCV 95.9 95.5 95.9 97.2  PLT 185 172 204 207   Cardiac Enzymes: No results for input(s): "CKTOTAL", "CKMB", "CKMBINDEX", "TROPONINI" in the last 168 hours. BNP: BNP (last 3 results) Recent Labs    05/07/23 1320  BNP 573.1*    ProBNP (last 3 results) No results for input(s): "PROBNP" in the last 8760 hours.  CBG: No results for input(s): "GLUCAP" in the last 168 hours.     Signed:  Silvano Bilis MD.  [facility] 05/16/2023, 11:08 AM

## 2023-05-17 LAB — CULTURE, BLOOD (ROUTINE X 2)

## 2023-11-26 ENCOUNTER — Emergency Department: Payer: Medicare Other

## 2023-11-26 ENCOUNTER — Other Ambulatory Visit: Payer: Self-pay

## 2023-11-26 DIAGNOSIS — Z803 Family history of malignant neoplasm of breast: Secondary | ICD-10-CM

## 2023-11-26 DIAGNOSIS — F0394 Unspecified dementia, unspecified severity, with anxiety: Secondary | ICD-10-CM | POA: Diagnosis present

## 2023-11-26 DIAGNOSIS — Z90710 Acquired absence of both cervix and uterus: Secondary | ICD-10-CM

## 2023-11-26 DIAGNOSIS — I872 Venous insufficiency (chronic) (peripheral): Secondary | ICD-10-CM | POA: Diagnosis present

## 2023-11-26 DIAGNOSIS — E039 Hypothyroidism, unspecified: Secondary | ICD-10-CM | POA: Diagnosis present

## 2023-11-26 DIAGNOSIS — K219 Gastro-esophageal reflux disease without esophagitis: Secondary | ICD-10-CM | POA: Diagnosis present

## 2023-11-26 DIAGNOSIS — F03911 Unspecified dementia, unspecified severity, with agitation: Secondary | ICD-10-CM | POA: Diagnosis present

## 2023-11-26 DIAGNOSIS — Z881 Allergy status to other antibiotic agents status: Secondary | ICD-10-CM

## 2023-11-26 DIAGNOSIS — Z880 Allergy status to penicillin: Secondary | ICD-10-CM

## 2023-11-26 DIAGNOSIS — I10 Essential (primary) hypertension: Secondary | ICD-10-CM | POA: Diagnosis present

## 2023-11-26 DIAGNOSIS — Z79890 Hormone replacement therapy (postmenopausal): Secondary | ICD-10-CM

## 2023-11-26 DIAGNOSIS — J441 Chronic obstructive pulmonary disease with (acute) exacerbation: Secondary | ICD-10-CM | POA: Diagnosis present

## 2023-11-26 DIAGNOSIS — Z950 Presence of cardiac pacemaker: Secondary | ICD-10-CM

## 2023-11-26 DIAGNOSIS — T380X5A Adverse effect of glucocorticoids and synthetic analogues, initial encounter: Secondary | ICD-10-CM | POA: Diagnosis present

## 2023-11-26 DIAGNOSIS — X58XXXA Exposure to other specified factors, initial encounter: Secondary | ICD-10-CM | POA: Diagnosis present

## 2023-11-26 DIAGNOSIS — I050 Rheumatic mitral stenosis: Secondary | ICD-10-CM | POA: Diagnosis present

## 2023-11-26 DIAGNOSIS — Z1623 Resistance to quinolones and fluoroquinolones: Secondary | ICD-10-CM | POA: Diagnosis present

## 2023-11-26 DIAGNOSIS — N390 Urinary tract infection, site not specified: Secondary | ICD-10-CM | POA: Diagnosis present

## 2023-11-26 DIAGNOSIS — R7989 Other specified abnormal findings of blood chemistry: Secondary | ICD-10-CM | POA: Diagnosis present

## 2023-11-26 DIAGNOSIS — E8770 Fluid overload, unspecified: Secondary | ICD-10-CM | POA: Diagnosis present

## 2023-11-26 DIAGNOSIS — A411 Sepsis due to other specified staphylococcus: Principal | ICD-10-CM | POA: Diagnosis present

## 2023-11-26 DIAGNOSIS — E871 Hypo-osmolality and hyponatremia: Secondary | ICD-10-CM | POA: Diagnosis present

## 2023-11-26 DIAGNOSIS — R531 Weakness: Secondary | ICD-10-CM | POA: Diagnosis not present

## 2023-11-26 DIAGNOSIS — I442 Atrioventricular block, complete: Secondary | ICD-10-CM | POA: Diagnosis present

## 2023-11-26 DIAGNOSIS — R6520 Severe sepsis without septic shock: Secondary | ICD-10-CM | POA: Diagnosis present

## 2023-11-26 DIAGNOSIS — N179 Acute kidney failure, unspecified: Secondary | ICD-10-CM | POA: Diagnosis present

## 2023-11-26 DIAGNOSIS — E878 Other disorders of electrolyte and fluid balance, not elsewhere classified: Secondary | ICD-10-CM | POA: Diagnosis present

## 2023-11-26 DIAGNOSIS — E876 Hypokalemia: Secondary | ICD-10-CM | POA: Diagnosis present

## 2023-11-26 DIAGNOSIS — J90 Pleural effusion, not elsewhere classified: Secondary | ICD-10-CM | POA: Diagnosis present

## 2023-11-26 DIAGNOSIS — Z7951 Long term (current) use of inhaled steroids: Secondary | ICD-10-CM

## 2023-11-26 DIAGNOSIS — A4150 Gram-negative sepsis, unspecified: Principal | ICD-10-CM | POA: Diagnosis present

## 2023-11-26 DIAGNOSIS — Z888 Allergy status to other drugs, medicaments and biological substances status: Secondary | ICD-10-CM

## 2023-11-26 DIAGNOSIS — I739 Peripheral vascular disease, unspecified: Secondary | ICD-10-CM | POA: Diagnosis present

## 2023-11-26 DIAGNOSIS — Z885 Allergy status to narcotic agent status: Secondary | ICD-10-CM

## 2023-11-26 DIAGNOSIS — Z79899 Other long term (current) drug therapy: Secondary | ICD-10-CM

## 2023-11-26 DIAGNOSIS — Z96652 Presence of left artificial knee joint: Secondary | ICD-10-CM | POA: Diagnosis present

## 2023-11-26 LAB — CBC
HCT: 35.9 % — ABNORMAL LOW (ref 36.0–46.0)
Hemoglobin: 12.1 g/dL (ref 12.0–15.0)
MCH: 32.8 pg (ref 26.0–34.0)
MCHC: 33.7 g/dL (ref 30.0–36.0)
MCV: 97.3 fL (ref 80.0–100.0)
Platelets: 202 10*3/uL (ref 150–400)
RBC: 3.69 MIL/uL — ABNORMAL LOW (ref 3.87–5.11)
RDW: 12.5 % (ref 11.5–15.5)
WBC: 18.2 10*3/uL — ABNORMAL HIGH (ref 4.0–10.5)
nRBC: 0 % (ref 0.0–0.2)

## 2023-11-26 LAB — BASIC METABOLIC PANEL
Anion gap: 9 (ref 5–15)
BUN: 39 mg/dL — ABNORMAL HIGH (ref 8–23)
CO2: 24 mmol/L (ref 22–32)
Calcium: 8.9 mg/dL (ref 8.9–10.3)
Chloride: 93 mmol/L — ABNORMAL LOW (ref 98–111)
Creatinine, Ser: 1.64 mg/dL — ABNORMAL HIGH (ref 0.44–1.00)
GFR, Estimated: 29 mL/min — ABNORMAL LOW (ref >60)
Glucose, Bld: 111 mg/dL — ABNORMAL HIGH (ref 70–99)
Potassium: 4.8 mmol/L (ref 3.5–5.1)
Sodium: 126 mmol/L — ABNORMAL LOW (ref 135–145)

## 2023-11-26 LAB — TROPONIN I (HIGH SENSITIVITY): Troponin I (High Sensitivity): 61 ng/L — ABNORMAL HIGH (ref <18)

## 2023-11-26 NOTE — ED Notes (Addendum)
Via [facility] c/o weakness. Has UTI. Been taking macrobid x2days. Hx dementia

## 2023-11-26 NOTE — ED Triage Notes (Signed)
Pt reports hx neuropathy in bilateral legs, pt states recently she has been unable to walk due to it. Pt poor historian, hx dementia. Pt denies pain

## 2023-11-27 ENCOUNTER — Inpatient Hospital Stay
Admission: EM | Admit: 2023-11-27 | Discharge: 2023-12-05 | DRG: 872 | Disposition: A | Discharge status: Skilled Nursing Facility | Payer: Medicare Other | Attending: Osteopathic Medicine | Admitting: Osteopathic Medicine

## 2023-11-27 DIAGNOSIS — R6520 Severe sepsis without septic shock: Secondary | ICD-10-CM | POA: Diagnosis present

## 2023-11-27 DIAGNOSIS — E039 Hypothyroidism, unspecified: Secondary | ICD-10-CM | POA: Diagnosis present

## 2023-11-27 DIAGNOSIS — F419 Anxiety disorder, unspecified: Secondary | ICD-10-CM | POA: Diagnosis not present

## 2023-11-27 DIAGNOSIS — Z79890 Hormone replacement therapy (postmenopausal): Secondary | ICD-10-CM | POA: Diagnosis not present

## 2023-11-27 DIAGNOSIS — F0394 Unspecified dementia, unspecified severity, with anxiety: Secondary | ICD-10-CM | POA: Diagnosis present

## 2023-11-27 DIAGNOSIS — I872 Venous insufficiency (chronic) (peripheral): Secondary | ICD-10-CM | POA: Diagnosis present

## 2023-11-27 DIAGNOSIS — Z881 Allergy status to other antibiotic agents status: Secondary | ICD-10-CM | POA: Diagnosis not present

## 2023-11-27 DIAGNOSIS — N179 Acute kidney failure, unspecified: Secondary | ICD-10-CM | POA: Diagnosis present

## 2023-11-27 DIAGNOSIS — A411 Sepsis due to other specified staphylococcus: Secondary | ICD-10-CM | POA: Diagnosis present

## 2023-11-27 DIAGNOSIS — I10 Essential (primary) hypertension: Secondary | ICD-10-CM | POA: Diagnosis present

## 2023-11-27 DIAGNOSIS — E871 Hypo-osmolality and hyponatremia: Secondary | ICD-10-CM | POA: Diagnosis present

## 2023-11-27 DIAGNOSIS — K219 Gastro-esophageal reflux disease without esophagitis: Secondary | ICD-10-CM | POA: Insufficient documentation

## 2023-11-27 DIAGNOSIS — I442 Atrioventricular block, complete: Secondary | ICD-10-CM | POA: Diagnosis present

## 2023-11-27 DIAGNOSIS — J90 Pleural effusion, not elsewhere classified: Secondary | ICD-10-CM | POA: Diagnosis present

## 2023-11-27 DIAGNOSIS — R531 Weakness: Secondary | ICD-10-CM | POA: Diagnosis present

## 2023-11-27 DIAGNOSIS — A4150 Gram-negative sepsis, unspecified: Secondary | ICD-10-CM | POA: Diagnosis present

## 2023-11-27 DIAGNOSIS — X58XXXA Exposure to other specified factors, initial encounter: Secondary | ICD-10-CM | POA: Diagnosis present

## 2023-11-27 DIAGNOSIS — R7989 Other specified abnormal findings of blood chemistry: Secondary | ICD-10-CM | POA: Diagnosis present

## 2023-11-27 DIAGNOSIS — E876 Hypokalemia: Secondary | ICD-10-CM | POA: Diagnosis present

## 2023-11-27 DIAGNOSIS — Z880 Allergy status to penicillin: Secondary | ICD-10-CM | POA: Diagnosis not present

## 2023-11-27 DIAGNOSIS — J441 Chronic obstructive pulmonary disease with (acute) exacerbation: Secondary | ICD-10-CM | POA: Diagnosis present

## 2023-11-27 DIAGNOSIS — A419 Sepsis, unspecified organism: Secondary | ICD-10-CM

## 2023-11-27 DIAGNOSIS — F03911 Unspecified dementia, unspecified severity, with agitation: Secondary | ICD-10-CM | POA: Diagnosis present

## 2023-11-27 DIAGNOSIS — I050 Rheumatic mitral stenosis: Secondary | ICD-10-CM | POA: Diagnosis present

## 2023-11-27 DIAGNOSIS — T380X5A Adverse effect of glucocorticoids and synthetic analogues, initial encounter: Secondary | ICD-10-CM | POA: Diagnosis present

## 2023-11-27 DIAGNOSIS — Z885 Allergy status to narcotic agent status: Secondary | ICD-10-CM | POA: Diagnosis not present

## 2023-11-27 DIAGNOSIS — N390 Urinary tract infection, site not specified: Secondary | ICD-10-CM

## 2023-11-27 DIAGNOSIS — I739 Peripheral vascular disease, unspecified: Secondary | ICD-10-CM | POA: Diagnosis present

## 2023-11-27 DIAGNOSIS — Z1623 Resistance to quinolones and fluoroquinolones: Secondary | ICD-10-CM | POA: Diagnosis present

## 2023-11-27 LAB — URINALYSIS, ROUTINE W REFLEX MICROSCOPIC
Bilirubin Urine: NEGATIVE
Glucose, UA: NEGATIVE mg/dL
Hgb urine dipstick: NEGATIVE
Ketones, ur: 5 mg/dL — AB
Leukocytes,Ua: NEGATIVE
Nitrite: NEGATIVE
Protein, ur: 30 mg/dL — AB
Specific Gravity, Urine: 1.017 (ref 1.005–1.030)
pH: 5 (ref 5.0–8.0)

## 2023-11-27 LAB — CBC
HCT: 35.4 % — ABNORMAL LOW (ref 36.0–46.0)
Hemoglobin: 11.4 g/dL — ABNORMAL LOW (ref 12.0–15.0)
MCH: 33.1 pg (ref 26.0–34.0)
MCHC: 32.2 g/dL (ref 30.0–36.0)
MCV: 102.9 fL — ABNORMAL HIGH (ref 80.0–100.0)
Platelets: 186 10*3/uL (ref 150–400)
RBC: 3.44 MIL/uL — ABNORMAL LOW (ref 3.87–5.11)
RDW: 12.7 % (ref 11.5–15.5)
WBC: 15.9 10*3/uL — ABNORMAL HIGH (ref 4.0–10.5)
nRBC: 0 % (ref 0.0–0.2)

## 2023-11-27 LAB — BASIC METABOLIC PANEL
Anion gap: 10 (ref 5–15)
BUN: 35 mg/dL — ABNORMAL HIGH (ref 8–23)
CO2: 22 mmol/L (ref 22–32)
Calcium: 7.8 mg/dL — ABNORMAL LOW (ref 8.9–10.3)
Chloride: 99 mmol/L (ref 98–111)
Creatinine, Ser: 1.33 mg/dL — ABNORMAL HIGH (ref 0.44–1.00)
GFR, Estimated: 38 mL/min — ABNORMAL LOW (ref >60)
Glucose, Bld: 97 mg/dL (ref 70–99)
Potassium: 4.1 mmol/L (ref 3.5–5.1)
Sodium: 131 mmol/L — ABNORMAL LOW (ref 135–145)

## 2023-11-27 LAB — PROTIME-INR
INR: 1.2 (ref 0.8–1.2)
Prothrombin Time: 15 s (ref 11.4–15.2)

## 2023-11-27 LAB — TROPONIN I (HIGH SENSITIVITY): Troponin I (High Sensitivity): 52 ng/L — ABNORMAL HIGH (ref <18)

## 2023-11-27 LAB — CORTISOL-AM, BLOOD: Cortisol - AM: 29.5 ug/dL — ABNORMAL HIGH (ref 6.7–22.6)

## 2023-11-27 LAB — PROCALCITONIN: Procalcitonin: 2.87 ng/mL

## 2023-11-27 LAB — BRAIN NATRIURETIC PEPTIDE: B Natriuretic Peptide: 495.5 pg/mL — ABNORMAL HIGH (ref 0.0–100.0)

## 2023-11-27 LAB — LACTIC ACID, PLASMA
Lactic Acid, Venous: 2 mmol/L (ref 0.5–1.9)
Lactic Acid, Venous: 1.9 mmol/L (ref 0.5–1.9)

## 2023-11-27 MED ORDER — SODIUM CHLORIDE 0.9 % IV BOLUS: 1000.0000 mL | Freq: Once | INTRAVENOUS | Status: DC

## 2023-11-27 MED ORDER — IPRATROPIUM-ALBUTEROL 0.5-2.5 (3) MG/3ML IN SOLN
3.0000 mL | Freq: Four times a day (QID) | RESPIRATORY_TRACT | Status: DC
Start: 2023-11-27 — End: 2023-11-28
  Administered 2023-11-27 – 2023-11-28 (×5): 3 mL via RESPIRATORY_TRACT
  Filled 2023-11-27 (×5): qty 3

## 2023-11-27 MED ORDER — VANCOMYCIN HCL 500 MG/100ML IV SOLN
500.0000 mg | Freq: Once | INTRAVENOUS | Status: DC
  Filled 2023-11-27: qty 100

## 2023-11-27 MED ORDER — LEVOTHYROXINE SODIUM 50 MCG PO TABS
50.0000 ug | ORAL_TABLET | Freq: Every day | ORAL | Status: DC
  Administered 2023-11-27 – 2023-12-05 (×7): 50 ug via ORAL
  Filled 2023-11-27 (×8): qty 1

## 2023-11-27 MED ORDER — FOLIC ACID 1 MG PO TABS
1.0000 mg | ORAL_TABLET | Freq: Every day | ORAL | Status: DC
  Administered 2023-11-27 – 2023-12-05 (×8): 1 mg via ORAL
  Filled 2023-11-27 (×9): qty 1

## 2023-11-27 MED ORDER — HYDROCOD POLI-CHLORPHE POLI ER 10-8 MG/5ML PO SUER
5.0000 mL | Freq: Two times a day (BID) | ORAL | Status: DC | PRN
  Administered 2023-11-28: 5 mL via ORAL
  Filled 2023-11-27 (×2): qty 5

## 2023-11-27 MED ORDER — ACETAMINOPHEN 650 MG RE SUPP: 650.0000 mg | Freq: Four times a day (QID) | RECTAL | Status: DC | PRN

## 2023-11-27 MED ORDER — PREDNISONE 20 MG PO TABS
40.0000 mg | ORAL_TABLET | Freq: Every day | ORAL | Status: DC
  Administered 2023-11-28: 40 mg via ORAL
  Filled 2023-11-27: qty 2

## 2023-11-27 MED ORDER — HALOPERIDOL LACTATE 5 MG/ML IJ SOLN
1.0000 mg | Freq: Once | INTRAMUSCULAR | Status: AC
  Administered 2023-11-27: 1 mg via INTRAMUSCULAR
  Filled 2023-11-27: qty 1

## 2023-11-27 MED ORDER — ADULT MULTIVITAMIN W/MINERALS CH
1.0000 | ORAL_TABLET | Freq: Every day | ORAL | Status: DC
  Administered 2023-11-27 – 2023-12-05 (×9): 1 via ORAL
  Filled 2023-11-27 (×9): qty 1

## 2023-11-27 MED ORDER — ACETAMINOPHEN 325 MG PO TABS: 650.0000 mg | ORAL_TABLET | Freq: Four times a day (QID) | ORAL | Status: DC | PRN

## 2023-11-27 MED ORDER — METHYLPREDNISOLONE SODIUM SUCC 40 MG IJ SOLR
40.0000 mg | Freq: Two times a day (BID) | INTRAMUSCULAR | Status: AC
  Administered 2023-11-27 (×2): 40 mg via INTRAVENOUS
  Filled 2023-11-27 (×2): qty 1

## 2023-11-27 MED ORDER — VANCOMYCIN HCL 500 MG/100ML IV SOLN
500.0000 mg | INTRAVENOUS | Status: AC
  Administered 2023-11-28 – 2023-12-01 (×4): 500 mg via INTRAVENOUS
  Filled 2023-11-27 (×6): qty 100

## 2023-11-27 MED ORDER — VITAMIN D3 25 MCG (1000 UNIT) PO TABS
5000.0000 [IU] | ORAL_TABLET | Freq: Every day | ORAL | Status: DC
  Administered 2023-11-27 – 2023-12-05 (×9): 5000 [IU] via ORAL
  Filled 2023-11-27 (×17): qty 5

## 2023-11-27 MED ORDER — ONDANSETRON HCL 4 MG/2ML IJ SOLN: 4.0000 mg | Freq: Four times a day (QID) | INTRAMUSCULAR | Status: DC | PRN

## 2023-11-27 MED ORDER — SODIUM CHLORIDE 0.9 % IV BOLUS
2000.0000 mL | Freq: Once | INTRAVENOUS | Status: AC
  Administered 2023-11-27: 2000 mL via INTRAVENOUS

## 2023-11-27 MED ORDER — RISAQUAD PO CAPS
1.0000 | ORAL_CAPSULE | Freq: Every day | ORAL | Status: DC
  Administered 2023-11-27 – 2023-12-05 (×9): 1 via ORAL
  Filled 2023-11-27 (×9): qty 1

## 2023-11-27 MED ORDER — SODIUM CHLORIDE 0.9% FLUSH
10.0000 mL | Freq: Two times a day (BID) | INTRAVENOUS | Status: DC
  Administered 2023-11-27 – 2023-12-04 (×14): 10 mL via INTRAVENOUS

## 2023-11-27 MED ORDER — MOMETASONE FURO-FORMOTEROL FUM 200-5 MCG/ACT IN AERO
2.0000 | INHALATION_SPRAY | Freq: Two times a day (BID) | RESPIRATORY_TRACT | Status: DC
  Administered 2023-11-27 – 2023-12-05 (×14): 2 via RESPIRATORY_TRACT
  Filled 2023-11-27: qty 8.8

## 2023-11-27 MED ORDER — ONDANSETRON HCL 4 MG PO TABS: 4.0000 mg | ORAL_TABLET | Freq: Four times a day (QID) | ORAL | Status: DC | PRN

## 2023-11-27 MED ORDER — CYANOCOBALAMIN 1000 MCG/ML IJ SOLN: 1000.0000 ug | INTRAMUSCULAR | Status: DC

## 2023-11-27 MED ORDER — VITAMIN C 500 MG PO TABS
1000.0000 mg | ORAL_TABLET | Freq: Every day | ORAL | Status: DC
  Administered 2023-11-27 – 2023-12-05 (×9): 1000 mg via ORAL
  Filled 2023-11-27 (×9): qty 2

## 2023-11-27 MED ORDER — TRAZODONE HCL 50 MG PO TABS
25.0000 mg | ORAL_TABLET | Freq: Every evening | ORAL | Status: DC | PRN
  Administered 2023-11-27 – 2023-12-04 (×3): 25 mg via ORAL
  Filled 2023-11-27 (×4): qty 1

## 2023-11-27 MED ORDER — ALBUTEROL SULFATE (2.5 MG/3ML) 0.083% IN NEBU
2.5000 mg | INHALATION_SOLUTION | Freq: Four times a day (QID) | RESPIRATORY_TRACT | Status: DC | PRN
  Administered 2023-11-27 – 2023-11-29 (×2): 2.5 mg via RESPIRATORY_TRACT
  Filled 2023-11-27 (×2): qty 3

## 2023-11-27 MED ORDER — SODIUM CHLORIDE 0.9 % IV SOLN
2.0000 g | INTRAVENOUS | Status: DC
  Administered 2023-11-27 – 2023-11-28 (×2): 2 g via INTRAVENOUS
  Filled 2023-11-27 (×2): qty 20

## 2023-11-27 MED ORDER — ALBUTEROL SULFATE HFA 108 (90 BASE) MCG/ACT IN AERS: 2.0000 | INHALATION_SPRAY | Freq: Four times a day (QID) | RESPIRATORY_TRACT | Status: DC | PRN

## 2023-11-27 MED ORDER — GUAIFENESIN ER 600 MG PO TB12
600.0000 mg | ORAL_TABLET | Freq: Two times a day (BID) | ORAL | Status: DC
  Administered 2023-11-27 – 2023-12-05 (×15): 600 mg via ORAL
  Filled 2023-11-27 (×17): qty 1

## 2023-11-27 MED ORDER — OYSTER SHELL CALCIUM/D3 500-5 MG-MCG PO TABS
1.0000 | ORAL_TABLET | Freq: Two times a day (BID) | ORAL | Status: DC
  Administered 2023-11-27 – 2023-12-05 (×15): 1 via ORAL
  Filled 2023-11-27 (×17): qty 1

## 2023-11-27 MED ORDER — HALOPERIDOL LACTATE 5 MG/ML IJ SOLN
2.0000 mg | Freq: Once | INTRAMUSCULAR | Status: AC
  Administered 2023-11-27: 2 mg via INTRAMUSCULAR
  Filled 2023-11-27: qty 1

## 2023-11-27 MED ORDER — LACTATED RINGERS IV SOLN
150.0000 mL/h | INTRAVENOUS | Status: DC
  Administered 2023-11-27: 150 mL/h via INTRAVENOUS

## 2023-11-27 MED ORDER — PANTOPRAZOLE SODIUM 40 MG PO TBEC
40.0000 mg | DELAYED_RELEASE_TABLET | Freq: Every day | ORAL | Status: DC
  Administered 2023-11-27 – 2023-12-05 (×9): 40 mg via ORAL
  Filled 2023-11-27 (×9): qty 1

## 2023-11-27 MED ORDER — ENOXAPARIN SODIUM 30 MG/0.3ML IJ SOSY
30.0000 mg | PREFILLED_SYRINGE | INTRAMUSCULAR | Status: DC
  Administered 2023-11-27 – 2023-12-02 (×6): 30 mg via SUBCUTANEOUS
  Filled 2023-11-27 (×6): qty 0.3

## 2023-11-27 MED ORDER — VANCOMYCIN HCL 10 G IV SOLR
1750.0000 mg | Freq: Once | INTRAVENOUS | Status: AC
  Administered 2023-11-27: 1750 mg via INTRAVENOUS
  Filled 2023-11-27: qty 17.5

## 2023-11-27 MED ORDER — PROPRANOLOL HCL ER 60 MG PO CP24
60.0000 mg | ORAL_CAPSULE | Freq: Every day | ORAL | Status: DC
  Administered 2023-11-27 – 2023-12-05 (×9): 60 mg via ORAL
  Filled 2023-11-27 (×9): qty 1

## 2023-11-27 MED ORDER — ESTRADIOL 0.1 MG/GM VA CREA: 1.0000 | TOPICAL_CREAM | Freq: Every evening | VAGINAL | Status: DC | PRN

## 2023-11-27 MED ORDER — ALPRAZOLAM 0.25 MG PO TABS
0.2500 mg | ORAL_TABLET | Freq: Two times a day (BID) | ORAL | Status: DC | PRN
  Administered 2023-11-29 – 2023-12-04 (×2): 0.25 mg via ORAL
  Filled 2023-11-27 (×3): qty 1

## 2023-11-27 MED ORDER — MAGNESIUM HYDROXIDE 400 MG/5ML PO SUSP
30.0000 mL | Freq: Every day | ORAL | Status: DC | PRN
  Filled 2023-11-27: qty 30

## 2023-11-27 NOTE — Progress Notes (Signed)
Pharmacy Antibiotic Note  [name] is a 89 y.o. female admitted on 11/27/2023 with sepsis.  Pharmacy has been consulted for Vancomycin dosing.  Plan: Pt given Vancomycin 1750 mg once. Vancomycin 500 mg IV Q 24 hrs. Goal AUC 400-550. Expected AUC: 480.2 SCr used: 1.64, Vd used: 0.72  Pharmacy will continue to follow and will adjust abx dosing whenever warranted.  Temp (24hrs), Avg:98.3 F (36.8 C), Min:98.3 F (36.8 C), Max:98.3 F (36.8 C)   Recent Labs  Lab 11/26/23 2210  WBC 18.2*  CREATININE 1.64*    Estimated Creatinine Clearance: 21.1 mL/min (A) (by C-G formula based on SCr of 1.64 mg/dL (H)).    Allergies  Allergen Reactions   Codeine Nausea And Vomiting   Doxycycline Other (See Comments)    Flu-like s/s   Morphine Nausea And Vomiting and Anxiety    Hallucinations and anxiety   Tramadol Nausea And Vomiting and Other (See Comments)    Causes poor balance   Anakinra    Celecoxib    Hydroxychloroquine     Other reaction(s): Unknown   Adhesive [Tape] Rash   Amitriptyline Other (See Comments)    Caused nervousness. Patient states she has never taken this.   Ciprofloxacin Diarrhea   Clarithromycin Nausea Only    Flu like symptoms   Erythromycin Rash   Itraconazole Other (See Comments)    Elevated liver enzymes   Nsaids Nausea Only    Unknown   Penicillins Rash    Hives also. Doctor told her to never ever take pcn unless it is her only option   Zoloft [Sertraline Hcl] Other (See Comments)    Patient unaware of any reaction. Probably upset stomach    Antimicrobials this admission: 12/16 Vancomycin >>  12/16 Ceftriaxone >>   Microbiology results: 12/16 BCx: Pending 12/16 UCx: Pending   Thank you for allowing pharmacy to be a part of this patient's care.  Otelia Sergeant, PharmD, [facility] 11/27/2023 4:27 AM

## 2023-11-27 NOTE — Assessment & Plan Note (Signed)
-   We will continue PPI therapy 

## 2023-11-27 NOTE — Hospital Course (Addendum)
HPI: [name] is a 89 y.o. Caucasian female with medical history significant for essential hypertension, GERD, peripheral neuropathy, COPD and and PVD, who presented to the ED with acute onset of generalized weakness with urinary frequency and urgency without dysuria or hematuria or flank pain, over the last 1 to 2 weeks, w/ nausea and vomiting during the last week. She was given Macrobid for an outpatient diagnosis UTI and urine culture grew Staph epidermidis on 12/10.   Hospital course / significant events:  12/16: to ED, admitted to hospitalist service w/ sepsis d/t UTI, COPD exacerbation. Fluid overload after IV fluids, possible underlying HFpEF (Echo 04/2023 normal EF w/ indeterminate diastolic fxn, moderate MS).  12/17: continue abx 12/18-12/24: pending SNF placement for rehab. Awaiting family to provide HCPOA paperwork  Consultants:  none  Procedures/Surgeries: none      ASSESSMENT & PLAN:   Severe sepsis d/t UTI - resolved S/p abx treatment Medically stable  Recheck as needed  Persistent leukocytosis likely d/t steroids for COPD exacerbation Trending down  No s/s infection at this time  Monitor CBC  AKI resolved Monitor BMP   COPD exacerbation resolved Completed prednisone  Continue home dulera Bronchodilators prn  Guaifenesin prn   Elevated BNP-495.5 on admission.  Low suspicion of acute CHF at this time. TTE showed normal EF, moderate mitral stenosis Euvolemic  Montior volume status   Acute on chronic hyponatremia Improved  Monitor BMP  Hypokalemia Replace as needed Monitor BMP  BLE edema Venous insufficiency  Continue compression   HTN Propranolol   Hypothyroid Synthroid  GERD Po PPI   *** based on BMI: Body mass index is 29.62 kg/m.  Underweight - under 18  overweight - 25 to 29 obese - 30 or more Class 1 obesity: BMI of 30.0 to 34 Class 2 obesity: BMI of 35.0 to 39 Class 3 obesity: BMI of 40.0 to 49 Super Morbid Obesity: BMI  50-59 Super-super Morbid Obesity: BMI 60+ Significantly low or high BMI is associated with higher medical risk.  Weight management advised as adjunct to other disease management and risk reduction treatments    DVT prophylaxis: *** IV fluids: *** continuous IV fluids  Nutrition: *** Central lines / invasive devices: ***  Code Status: *** ACP documentation reviewed: *** none on file in [facility]  TOC needs: *** Barriers to dispo / significant pending items: ***

## 2023-11-27 NOTE — Progress Notes (Signed)
PHARMACIST - PHYSICIAN COMMUNICATION  CONCERNING:  Enoxaparin (Lovenox) for DVT Prophylaxis    RECOMMENDATION: Patient was prescribed enoxaprin 40mg  q24 hours for VTE prophylaxis.   Filed Weights   11/26/23 2208  Weight: 81.6 kg (180 lb)    Body mass index is 35.15 kg/m.  Estimated Creatinine Clearance: 21.1 mL/min (A) (by C-G formula based on SCr of 1.64 mg/dL (H)).  Patient is candidate for enoxaparin 30mg  every 24 hours based on CrCl <49ml/min or Weight <45kg  DESCRIPTION: Pharmacy has adjusted enoxaparin dose per [facility] policy.  Patient is now receiving enoxaparin 30 mg every 24 hours   Otelia Sergeant, PharmD, [facility] 11/27/2023 4:35 AM

## 2023-11-27 NOTE — Progress Notes (Signed)
No charge progress note.  [name] is a 89 y.o. Caucasian female with medical history significant for essential hypertension, GERD, peripheral neuropathy, COPD and and peripheral vascular disease, who presented to the emergency room with acute onset of generalized weakness with recent urinary frequency and urgency without dysuria or hematuria or flank pain, over the last 1 to 2 weeks.  She admitted to nausea and vomiting during the last week.  She was given Macrobid for an outpatient diagnosis UTI and urine culture grew Staph epidermidis on 12/10.   On presentation hemodynamically stable, labs with hyponatremia of 126, BUN 39, creatinine 1.64, troponin 61>>52, lactic acid 2, leukocytosis at 18.2, UA with 6-10 WBCs and 6-10 RBCs and rare bacteria.  Blood and urine cultures ordered. EKG with atrial sensed ventricular paced rhythm. CXR with small pleural effusions and borderline cardiomegaly.  Patient was started on IV Rocephin and vancomycin based on recent outpatient urine culture.  12/16: Vital stable, procalcitonin at 2.87, lactic acidosis resolved, preliminary blood cultures negative in 12-hour, pending urine culture. Patient also had some orthopnea and significant lower extremity edema, prior echo done in May 2024 with normal EF, indeterminate diastolic function and moderate mitral stenosis.  BNP elevated at 495 ,avoiding further IV fluid.  On exam patient had very few basal crackles with 2+ lower extremity edema, no shortness of breath or hypoxia.  Holding Lasix for now as patient is being treated for sepsis, did received IV fluid.  Stopping further IV fluid and if developed any respiratory compromise then she will need Lasix.  -Continue current management

## 2023-11-27 NOTE — ED Provider Notes (Signed)
[facility] Provider Note    Event Date/Time   First MD Initiated Contact with Patient 11/27/23 [phone]     (approximate)   History   Weakness   HPI  [name] is a 89 y.o. female who presents to the ED for evaluation of Weakness   I review an outpatient urine culture results in 12/13.  Staph epidermidis resistant to Levaquin, Cipro penicillin and Bactrim..  Looks like she was prescribed Macrobid.  Started that same day.  Susceptible to vancomycin  Patient presents to the ED alongside her caregiver for evaluation of generalized weakness, inability to walk despite adherence to the Macrobid.   Physical Exam   Triage Vital Signs: ED Triage Vitals  Encounter Vitals Group     BP 11/26/23 2210 (!) 144/59     Systolic BP Percentile --      Diastolic BP Percentile --      Pulse Rate 11/26/23 2210 81     Resp 11/26/23 2210 18     Temp 11/26/23 2210 98.3 F (36.8 C)     Temp Source 11/26/23 2210 Oral     SpO2 11/26/23 2210 100 %     Weight 11/26/23 2208 180 lb (81.6 kg)     Height 11/26/23 2208 5' (1.524 m)     Head Circumference --      Peak Flow --      Pain Score 11/26/23 2208 0     Pain Loc --      Pain Education --      Exclude from Growth Chart --     Most recent vital signs: Vitals:   11/26/23 2210 11/27/23 0353  BP: (!) 144/59   Pulse: 81   Resp: 18 (!) 22  Temp: 98.3 F (36.8 C)   SpO2: 100%     General: Awake, no distress.  Pleasantly demented. CV:  Good peripheral perfusion.  Resp:  Normal effort.  Faint and scattered expiratory wheezes.  Occasional coughing.  No distress. Abd:  No distention.  Suprapubic tenderness without peritoneal features.  Benign upper abdomen. MSK:  No deformity noted.  Neuro:  No focal deficits appreciated. Other:     ED Results / Procedures / Treatments   Labs (all labs ordered are listed, but only abnormal results are displayed) Labs Reviewed  BASIC METABOLIC PANEL - Abnormal; Notable for  the following components:      Result Value   Sodium 126 (*)    Chloride 93 (*)    Glucose, Bld 111 (*)    BUN 39 (*)    Creatinine, Ser 1.64 (*)    GFR, Estimated 29 (*)    All other components within normal limits  CBC - Abnormal; Notable for the following components:   WBC 18.2 (*)    RBC 3.69 (*)    HCT 35.9 (*)    All other components within normal limits  TROPONIN I (HIGH SENSITIVITY) - Abnormal; Notable for the following components:   Troponin I (High Sensitivity) 61 (*)    All other components within normal limits  TROPONIN I (HIGH SENSITIVITY) - Abnormal; Notable for the following components:   Troponin I (High Sensitivity) 52 (*)    All other components within normal limits  CULTURE, BLOOD (ROUTINE X 2)  CULTURE, BLOOD (ROUTINE X 2)  URINE CULTURE  URINALYSIS, ROUTINE W REFLEX MICROSCOPIC  LACTIC ACID, PLASMA  LACTIC ACID, PLASMA  CBG MONITORING, ED    EKG Paced rhythm with a rate of  79 bpm.  Normal axis.  Left bundle morphology.  No STEMI by Sgarbossa criteria  RADIOLOGY CXR interpreted by me without evidence of acute cardiopulmonary pathology.  Official radiology report(s): DG Chest 2 View Result Date: 11/27/2023 CLINICAL DATA:  Weakness EXAM: CHEST - 2 VIEW COMPARISON:  05/11/2023 FINDINGS: Left-sided pacing device. Small pleural effusions. Borderline cardiomegaly. Aortic atherosclerosis. No pneumothorax. Hardware in the cervical spine IMPRESSION: Small pleural effusions. Borderline cardiomegaly. Electronically Signed   By: [name] Pang M.D.   On: 11/27/2023 00:10    PROCEDURES and INTERVENTIONS:  .1-3 Lead EKG Interpretation  Performed by: Delton Prairie, MD Authorized by: Delton Prairie, MD     Interpretation: normal     ECG rate:  90   ECG rate assessment: normal     Rhythm: sinus rhythm     Ectopy: none     Conduction: normal   .Critical Care  Performed by: Delton Prairie, MD Authorized by: Delton Prairie, MD   Critical care provider statement:     Critical care time (minutes):  30   Critical care time was exclusive of:  Separately billable procedures and treating other patients   Critical care was necessary to treat or prevent imminent or life-threatening deterioration of the following conditions:  Sepsis   Critical care was time spent personally by me on the following activities:  Development of treatment plan with patient or surrogate, discussions with consultants, evaluation of patient's response to treatment, examination of patient, ordering and review of laboratory studies, ordering and review of radiographic studies, ordering and performing treatments and interventions, pulse oximetry, re-evaluation of patient's condition and review of old charts   Medications  vancomycin (VANCOCIN) 1,750 mg in sodium chloride 0.9 % 500 mL IVPB (has no administration in time range)  sodium chloride flush (NS) 0.9 % injection 10 mL (has no administration in time range)  sodium chloride 0.9 % bolus 2,000 mL (has no administration in time range)     IMPRESSION / MDM / ASSESSMENT AND PLAN / ED COURSE  I reviewed the triage vital signs and the nursing notes.  Differential diagnosis includes, but is not limited to, sepsis, AKI, dehydration, electrolyte derangement, pneumonia, cardiac dysrhythmia  {Patient presents with symptoms of an acute illness or injury that is potentially life-threatening.  Patient presents for evaluation of worsening generalized weakness in the setting of recently diagnosed UTI, with evidence of sepsis from UTI alongside an AKI, requiring medical admission.  Mild tachypnea, otherwise normal vital signs.  Leukocytosis is noted.  AKI and hyponatremia.  Downtrending mildly elevated troponins and she has no chest pain.  EKG is nonischemic.  CXR is clear.  Awaiting a catheterized urine sample but I am able to see a UA done as an outpatient recently and I suspect worsening urinary tract infectious disease precipitating sepsis and systemic  symptoms.  We will start IV vancomycin due to susceptibilities of this recent outpatient culture, send fresh cultures and consult medicine for admission.  Clinical Course as of 11/27/23 0401  Mon Nov 27, 2023  0401 I consult with medicine who agrees to admit [DS]    Clinical Course User Index [DS] Delton Prairie, MD     FINAL CLINICAL IMPRESSION(S) / ED DIAGNOSES   Final diagnoses:  Generalized weakness  Sepsis due to urinary tract infection (HCC)  AKI (acute kidney injury) (HCC)     Rx / DC Orders   ED Discharge Orders     None        Note:  This document  was prepared using Conservation officer, historic buildings and may include unintentional dictation errors.   Delton Prairie, MD 11/27/23 [phone]

## 2023-11-27 NOTE — Sepsis Progress Note (Signed)
Elink following code sepsis °

## 2023-11-27 NOTE — Evaluation (Signed)
Occupational Therapy Evaluation Patient Details Name: [name] MRN: [id number] DOB: Dec 06, 1930 Today's Date: 11/27/2023   History of Present Illness Pt is a 89 y.o. Caucasian female with medical history significant for essential hypertension, GERD, peripheral neuropathy, COPD and and peripheral vascular disease, who presented to the emergency room with acute onset of generalized weakness with recent urinary frequency and urgency. MD assessment includes sepsis due to gram-negative UTI and COPD with acute exacerbation.   Clinical Impression   [name] was seen for OT evaluation this date. Prior to hospital admission, pt was SBA + RW for mobility, assist for ADLs. Pt lives alone with 24/7 PCAs. Pt currently requires MOD A exit bed, MAX A return to bed. Poor sitting tolerance, pt agitated with further mobility attempts citing fear of falling (required +2 to stand from elevated stretcher height in PT session). Pt would benefit from skilled OT to address noted impairments and functional limitations (see below for any additional details). Upon hospital discharge, recommend OT follow up.      If plan is discharge home, recommend the following: Two people to help with walking and/or transfers;A lot of help with bathing/dressing/bathroom;Supervision due to cognitive status    Functional Status Assessment  Patient has had a recent decline in their functional status and demonstrates the ability to make significant improvements in function in a reasonable and predictable amount of time.  Equipment Recommendations  Other (comment) (defer)    Recommendations for Other Services       Precautions / Restrictions Precautions Precautions: Fall Restrictions Weight Bearing Restrictions Per Provider Order: No      Mobility Bed Mobility Overal bed mobility: Needs Assistance Bed Mobility: Supine to Sit, Sit to Supine     Supine to sit: Mod assist Sit to supine: Max assist        Transfers                    General transfer comment: unsafe to attempt, poor sitting tolerance      Balance Overall balance assessment: Needs assistance Sitting-balance support: Bilateral upper extremity supported Sitting balance-Leahy Scale: Fair                                     ADL either performed or assessed with clinical judgement   ADL Overall ADL's : Needs assistance/impaired                                       General ADL Comments: MAX A for LB access in sitting. CGA seated grooming tasks      Pertinent Vitals/Pain Pain Assessment Pain Assessment: No/denies pain     Extremity/Trunk Assessment Upper Extremity Assessment Upper Extremity Assessment: Generalized weakness   Lower Extremity Assessment Lower Extremity Assessment: Generalized weakness       Communication Communication Communication: Hearing impairment Cueing Techniques: Verbal cues;Tactile cues;Visual cues   Cognition Arousal: Alert Behavior During Therapy: Restless Overall Cognitive Status: No family/caregiver present to determine baseline cognitive functioning                                 General Comments: pt repeatedly asking "do you always come do this at night?" unable to reorient to daytime.  Home Living Family/patient expects to be discharged to:: Private residence Living Arrangements: Alone Available Help at Discharge: Personal care attendant;Available 24 hours/day Type of Home: House Home Access: Ramped entrance     Home Layout: Two level;Able to live on main level with bedroom/bathroom     Bathroom Shower/Tub: Sponge bathes at baseline;Tub/shower unit   Bathroom Toilet: Handicapped height     Home Equipment: Agricultural consultant (2 wheels);Shower seat;Grab bars - tub/shower;Grab bars - toilet;BSC/3in1;Wheelchair - manual   Additional Comments: PCA 24/7      Prior Functioning/Environment Prior Level of Function  : Needs assist  Cognitive Assist : Mobility (cognitive)           Mobility Comments: Mod Ind to SBA with limited household ambulation, assistance needed with bed mobility, w/c in the community, no falls but multiple near falls in the last 6 months per son ADLs Comments: Caregivers provide assistance with all ADLs/IADLs        OT Problem List: Decreased strength;Decreased activity tolerance;Decreased range of motion;Impaired balance (sitting and/or standing);Decreased cognition      OT Treatment/Interventions: Self-care/ADL training;Therapeutic exercise;Energy conservation;DME and/or AE instruction;Therapeutic activities;Patient/family education    OT Goals(Current goals can be found in the care plan section) Acute Rehab OT Goals Patient Stated Goal: to go home OT Goal Formulation: With patient Time For Goal Achievement: 12/11/23 Potential to Achieve Goals: Fair ADL Goals Pt Will Perform Grooming: standing;with min assist Pt Will Perform Lower Body Dressing: with contact guard assist;sit to/from stand Pt Will Transfer to Toilet: ambulating;regular height toilet;with min assist  OT Frequency: Min 1X/week    Co-evaluation              AM-PAC OT "6 Clicks" Daily Activity     Outcome Measure Help from another person eating meals?: None Help from another person taking care of personal grooming?: A Little Help from another person toileting, which includes using toliet, bedpan, or urinal?: A Lot Help from another person bathing (including washing, rinsing, drying)?: A Lot Help from another person to put on and taking off regular upper body clothing?: A Lot Help from another person to put on and taking off regular lower body clothing?: A Lot 6 Click Score: 15   End of Session    Activity Tolerance: Patient tolerated treatment well Patient left: in bed;with call bell/phone within reach;with bed alarm set  OT Visit Diagnosis: Other abnormalities of gait and mobility  (R26.89);Muscle weakness (generalized) (M62.81)                Time: 4401-0272 OT Time Calculation (min): 8 min Charges:  OT General Charges $OT Visit: 1 Visit OT Evaluation $OT Eval Low Complexity: 1 Low  Kathie Dike, M.S. OTR/L  11/27/23, 12:20 PM  ascom [phone]

## 2023-11-27 NOTE — Assessment & Plan Note (Signed)
-   We will continue Synthroid. 

## 2023-11-27 NOTE — ED Notes (Addendum)
Pt very confused at this time. Pt pulled out two of her IV's while family at bedside. Pt is attempting to get out of bed, pt sts that there are ants all over the wall and want to leave the room. RN is attempting to reach out of provider.

## 2023-11-27 NOTE — Assessment & Plan Note (Signed)
-   We will continue Xanax. 

## 2023-11-27 NOTE — H&P (Signed)
[place]   PATIENT NAME: [name]    MR#:  [id number]  DATE OF BIRTH:  11/11/1930  DATE OF ADMISSION:  11/27/2023  PRIMARY CARE PHYSICIAN: Marguarite Arbour, MD   Patient is coming from: Home  REQUESTING/REFERRING PHYSICIAN:, Dylan, MD  CHIEF COMPLAINT:   Chief Complaint  Patient presents with   Weakness    HISTORY OF PRESENT ILLNESS:  [name] is a 89 y.o. Caucasian female with medical history significant for essential hypertension, GERD, peripheral neuropathy, COPD and and peripheral vascular disease, who presented to the emergency room with acute onset of generalized weakness with recent urinary frequency and urgency without dysuria or hematuria or flank pain, over the last 1 to 2 weeks.  She admitted to nausea and vomiting during the last week.  She denied any abdominal pain.  No diarrhea or melena or bright red bleeding per rectum.  She has been having occasional cough with inability to expectorate as well as wheezing since Saturday.  She was given Macrobid for an outpatient diagnosis UTI and urine culture grew Staph epidermidis on 12/10.  She admitted to worsening lower extremity edema.  No fever or chills.  No chest pain or palpitations.  ED Course: When she came to the ER, BP was 144/59 with otherwise normal vital signs.  Respiratory rate was 18 and later 22.  Labs revealed hyponatremia 126 and hypochloremia of 93 with a BUN of 39 creatinine 1.64.  High-sensitivity troponin I was 61 and later 52 lactic acid 2.  CBC showed leukocytosis of 18.2.  UA showed 6-10 WBCs and 6-10 RBCs with rare bacteria and 30 protein.  Urine culture was sent.  2 blood cultures were sent.   EKG as reviewed by me : EKG showed atrial sensed ventricular paced rhythm with a rate of 79. Imaging: Two-view chest x-ray showed small pleural effusions and borderline cardiomegaly.  The patient was given IV vancomycin based on his recent outpatient urine culture.  She will be admitted to a  medical telemetry bed for further evaluation and management. PAST MEDICAL HISTORY:   Past Medical History:  Diagnosis Date   Anemia    B12 deficiency 10/09/2019   GERD (gastroesophageal reflux disease)    Hypertension    Neuropathy    Peripheral vascular disease (HCC)    possible neuropathies in lower extremeties   PONV (postoperative nausea and vomiting)    happens sometimes but better with pre med of zofran   Presence of permanent cardiac pacemaker    Syncope     PAST SURGICAL HISTORY:   Past Surgical History:  Procedure Laterality Date   ABDOMINAL HYSTERECTOMY  1969   APPENDECTOMY  1969   with hysterectomy   BACK SURGERY  2001   rods in back. surgery on back x 3   COLONOSCOPY     I & D EXTREMITY Left 03/27/2018   Procedure: IRRIGATION AND DEBRIDEMENT LEFT ELBOW / OLECRANON BURSA;  Surgeon: Christena Flake, MD;  Location: [facility] ORS;  Service: Orthopedics;  Laterality: Left;   I & D EXTREMITY Left 06/05/2018   Procedure: IRRIGATION AND DEBRIDEMENT EXTREMITY;  Surgeon: Christena Flake, MD;  Location: [facility] ORS;  Service: Orthopedics;  Laterality: Left;   INSERT / REPLACE / REMOVE PACEMAKER  2004   JOINT REPLACEMENT Left 2004   partial knee replacement   OLECRANON BURSECTOMY Left 03/27/2018   Procedure: LEFT OLECRANON BURSA;  Surgeon: Christena Flake, MD;  Location: [facility] ORS;  Service: Orthopedics;  Laterality: Left;   PACEMAKER INSERTION  2014   dual chamber for complete heart block    SOCIAL HISTORY:   Social History   Tobacco Use   Smoking status: Never   Smokeless tobacco: Never  Substance Use Topics   Alcohol use: Yes    Alcohol/week: 7.0 standard drinks of alcohol    Types: 7 Glasses of wine per week    Comment: 1 glass of wine    FAMILY HISTORY:   Family History  Problem Relation Age of Onset   Breast cancer Mother 50    DRUG ALLERGIES:   Allergies  Allergen Reactions   Codeine Nausea And Vomiting   Doxycycline Other (See Comments)    Flu-like s/s    Morphine Nausea And Vomiting and Anxiety    Hallucinations and anxiety   Tramadol Nausea And Vomiting and Other (See Comments)    Causes poor balance   Anakinra    Celecoxib    Hydroxychloroquine     Other reaction(s): Unknown   Adhesive [Tape] Rash   Amitriptyline Other (See Comments)    Caused nervousness. Patient states she has never taken this.   Ciprofloxacin Diarrhea   Clarithromycin Nausea Only    Flu like symptoms   Erythromycin Rash   Itraconazole Other (See Comments)    Elevated liver enzymes   Nsaids Nausea Only    Unknown   Penicillins Rash    Hives also. Doctor told her to never ever take pcn unless it is her only option   Zoloft [Sertraline Hcl] Other (See Comments)    Patient unaware of any reaction. Probably upset stomach    REVIEW OF SYSTEMS:   ROS As per history of present illness. All pertinent systems were reviewed above. Constitutional, HEENT, cardiovascular, respiratory, GI, GU, musculoskeletal, neuro, psychiatric, endocrine, integumentary and hematologic systems were reviewed and are otherwise negative/unremarkable except for positive findings mentioned above in the HPI.   MEDICATIONS AT HOME:   Prior to Admission medications   Medication Sig Start Date End Date Taking? Authorizing Provider  acetaminophen (TYLENOL) 500 MG tablet Take 250 mg by mouth 2 (two) times daily.    [provider]  albuterol (VENTOLIN HFA) 108 (90 Base) MCG/ACT inhaler Inhale 2 puffs into the lungs every 6 (six) hours as needed. 08/20/22   [provider]  ALPRAZolam Prudy Feeler) 0.25 MG tablet Take 1 tablet (0.25 mg total) by mouth 2 (two) times daily as needed for anxiety. 07/27/21   Enedina Finner, MD  Ascorbic Acid (VITAMIN C) 1000 MG tablet Take 1,000 mg by mouth daily.    [provider]  Calcium Carbonate-Vitamin D (CALCIUM-D) 600-400 MG-UNIT TABS Take 1 tablet by mouth 2 (two) times daily.    [provider]  Calcium Polycarbophil (FIBER-CAPS PO)  Take 1 capsule by mouth at bedtime.     [provider]  cefadroxil (DURICEF) 500 MG capsule Take 1 capsule (500 mg total) by mouth 2 (two) times daily. 05/16/23   Wouk, Wilfred Curtis, MD  Cholecalciferol (VITAMIN D-3) 5000 units TABS Take 5,000 Units by mouth daily.    [provider]  clobetasol cream (TEMOVATE) 0.05 % Apply 1 Application topically 2 (two) times daily. 01/09/23   [provider]  cyanocobalamin (,VITAMIN B-12,) 1000 MCG/ML injection Inject 1,000 mcg into the muscle every 30 (thirty) days.    [provider]  estradiol (ESTRACE) 0.1 MG/GM vaginal cream Place 1 Applicatorful vaginally at bedtime as needed (for vaginal discomfort).    [provider]  fluticasone-salmeterol (ADVAIR) 250-50 MCG/ACT AEPB Inhale 1 puff into the lungs in the morning and at bedtime. 10/11/22   [provider]  folic acid (FOLVITE) 1 MG tablet Take 1 mg by mouth daily. Patient not taking: Reported on 05/10/2023    [provider]  hydrocortisone 2.5 % cream Apply 1 Application topically 2 (two) times daily. 01/09/23   [provider]  ketoconazole (NIZORAL) 2 % cream Apply 1 Application topically daily. 10/17/22   [provider]  levothyroxine (SYNTHROID) 50 MCG tablet Take 50 mcg by mouth daily.    [provider]  Misc Natural Products (CALMME PO) Take 5 mLs by mouth at bedtime. Magnesium 1 tsp at night (dissolved in 4 oz. Water)    [provider]  Multiple Vitamin (MULTIVITAMIN) tablet Take 1 tablet by mouth daily.    [provider]  mupirocin ointment (BACTROBAN) 2 % Apply 1 Application topically 2 (two) times daily. 04/07/23   [provider]  omeprazole (PRILOSEC) 40 MG capsule Take 40 mg by mouth daily before breakfast.     [provider]  ondansetron (ZOFRAN) 4 MG tablet Take 4 mg by mouth every 8 (eight) hours as needed for nausea or vomiting. Patient not taking: Reported on  05/10/2023    [provider]  oseltamivir (TAMIFLU) 30 MG capsule Take 1 capsule (30 mg total) by mouth 2 (two) times daily. 05/16/23   Wouk, Wilfred Curtis, MD  Probiotic Product (PHILLIPS COLON HEALTH PO) Take 1 tablet by mouth daily.    [provider]  propranolol ER (INDERAL LA) 60 MG 24 hr capsule Take 60 mg by mouth daily. 07/05/21   [provider]      VITAL SIGNS:  Blood pressure (!) 100/56, pulse 72, temperature 98.3 F (36.8 C), temperature source Oral, resp. rate (!) 22, height 5' (1.524 m), weight 81.6 kg, SpO2 97%.  PHYSICAL EXAMINATION:  Physical Exam  GENERAL:  89 y.o.-year-old patient lying in the bed with mild respiratory distress with conversational dyspnea. EYES: Pupils equal, round, reactive to light and accommodation. No scleral icterus. Extraocular muscles intact.  HEENT: Head atraumatic, normocephalic. Oropharynx and nasopharynx clear.  NECK:  Supple, no jugular venous distention. No thyroid enlargement, no tenderness.  LUNGS: Diminished expiratory airflow with associated diffuse expiratory wheezes.  No use of accessory muscles of respiration.  CARDIOVASCULAR: Regular rate and rhythm, S1, S2 normal. No murmurs, rubs, or gallops.  ABDOMEN: Soft, nondistended, nontender. Bowel sounds present. No organomegaly or mass.  EXTREMITIES: 1+ bilateral lower extremity pitting edema with no cyanosis, or clubbing.  NEUROLOGIC: Cranial nerves II through XII are intact. Muscle strength 5/5 in all extremities. Sensation intact. Gait not checked.  PSYCHIATRIC: The patient is alert and oriented x 3.  Normal affect and good eye contact. SKIN: No obvious rash, lesion, or ulcer.   LABORATORY PANEL:   CBC Recent Labs  Lab 11/26/23 2210  WBC 18.2*  HGB 12.1  HCT 35.9*  PLT 202   ------------------------------------------------------------------------------------------------------------------  Chemistries  Recent Labs  Lab 11/26/23 2210  NA 126*  K  4.8  CL 93*  CO2 24  GLUCOSE 111*  BUN 39*  CREATININE 1.64*  CALCIUM 8.9   ------------------------------------------------------------------------------------------------------------------  Cardiac Enzymes No results for input(s): "TROPONINI" in the last 168 hours. ------------------------------------------------------------------------------------------------------------------  RADIOLOGY:  DG Chest 2 View Result Date: 11/27/2023 CLINICAL DATA:  Weakness EXAM: CHEST - 2 VIEW COMPARISON:  05/11/2023 FINDINGS: Left-sided pacing device. Small pleural effusions. Borderline cardiomegaly. Aortic atherosclerosis. No pneumothorax. Hardware  in the cervical spine IMPRESSION: Small pleural effusions. Borderline cardiomegaly. Electronically Signed   By: [name] Pang M.D.   On: 11/27/2023 00:10      IMPRESSION AND PLAN:  Assessment and Plan: * Sepsis due to gram-negative UTI [facility]) - The patient will be admitted to a medical telemetry bed. - We will continue antibiotic therapy with IV Rocephin and vancomycin. - We will stop her Macrobid. - We will follow urine and blood cultures. - She was given 2 L bolus of IV normal saline and will place her on continued hydration with IV lactated ringer.  COPD with acute exacerbation (HCC) - We will place her on scheduled and as needed DuoNebs. - Mucolytic therapy will be provided. - The patient will be on IV Rocephin as mentioned above. - We will add IV steroids. - I will hold off long-acting beta agonist. - We will stop her Macrobid for the possibility of induced interstitial lung disease.  Hypothyroidism - We will continue Synthroid.  Essential hypertension - We will continue antihypertensive therapy with holding parameters for borderline BP.  Anxiety - We will continue Xanax.  GERD without esophagitis - We will continue PPI therapy.   DVT prophylaxis: Lovenox.  Advanced Care Planning:  Code Status: full code.  Family Communication:   The plan of care was discussed in details with the patient (and family). I answered all questions. The patient agreed to proceed with the above mentioned plan. Further management will depend upon hospital course. Disposition Plan: Back to previous home environment Consults called: none.  All the records are reviewed and case discussed with ED provider.  Status is: Inpatient    At the time of the admission, it appears that the appropriate admission status for this patient is inpatient.  This is judged to be reasonable and necessary in order to provide the required intensity of service to ensure the patient's safety given the presenting symptoms, physical exam findings and initial radiographic and laboratory data in the context of comorbid conditions.  The patient requires inpatient status due to high intensity of service, high risk of further deterioration and high frequency of surveillance required.  I certify that at the time of admission, it is my clinical judgment that the patient will require inpatient hospital care extending more than 2 midnights.                            Dispo: The patient is from: Home              Anticipated d/c is to: Home              Patient currently is not medically stable to d/c.              Difficult to place patient: No  Hannah Beat M.D on 11/27/2023 at 5:50 AM  [facility]   From 7 PM-7 AM, contact night-coverage [email or web address]  CC: Primary care physician; Marguarite Arbour, MD

## 2023-11-27 NOTE — Assessment & Plan Note (Signed)
-   We will continue antihypertensive therapy with holding parameters for borderline BP.

## 2023-11-27 NOTE — Evaluation (Signed)
Physical Therapy Evaluation Patient Details Name: [name] MRN: [id number] DOB: 1930/01/27 Today's Date: 11/27/2023  History of Present Illness  Pt is a 89 y.o. Caucasian female with medical history significant for essential hypertension, GERD, peripheral neuropathy, COPD and and peripheral vascular disease, who presented to the emergency room with acute onset of generalized weakness with recent urinary frequency and urgency. MD assessment includes sepsis due to gram-negative UTI and COPD with acute exacerbation.   Clinical Impression  Pt was pleasant and motivated to participate during the session and put forth good effort throughout. Pt required physical assistance with all functional tasks and presented with very poor functional strength in standing.  Pt was able to take 1-2 very small shuffling steps at the EOB but required +2 assist to prevent fall from instability and bilateral knee buckling.  At baseline pt is a limited household ambulator and presents with a significant decline in functional mobility compared to her stated baseline.  Pt's SpO2 and HR monitored continuously during the session on telemetry and WNL throughout on room air.  Pt will benefit from continued PT services upon discharge to safely address deficits listed in patient problem list for decreased caregiver assistance and eventual return to PLOF.          If plan is discharge home, recommend the following: A lot of help with walking and/or transfers;A little help with bathing/dressing/bathroom;Assistance with cooking/housework;Direct supervision/assist for medications management;Assist for transportation;Help with stairs or ramp for entrance   Can travel by private vehicle   No    Equipment Recommendations None recommended by PT  Recommendations for Other Services       Functional Status Assessment Patient has had a recent decline in their functional status and demonstrates the ability to make significant  improvements in function in a reasonable and predictable amount of time.     Precautions / Restrictions Precautions Precautions: Fall Restrictions Weight Bearing Restrictions Per Provider Order: No      Mobility  Bed Mobility Overal bed mobility: Needs Assistance Bed Mobility: Supine to Sit, Sit to Supine     Supine to sit: Mod assist Sit to supine: Max assist   General bed mobility comments: Assist needed for BLE and trunk control    Transfers Overall transfer level: Needs assistance Equipment used: Rolling walker (2 wheels) Transfers: Sit to/from Stand Sit to Stand: Min assist, +2 safety/equipment, From elevated surface           General transfer comment: Min A to come to full upright standing with pt often leaning posteriorly on the bed for support    Ambulation/Gait Ambulation/Gait assistance: Mod assist, +2 physical assistance Gait Distance (Feet): 1 Feet Assistive device: Rolling walker (2 wheels) Gait Pattern/deviations: Step-to pattern, Decreased step length - left, Decreased stance time - right Gait velocity: decreased     General Gait Details: Pt able to take 1-2 very small shuffling steps at the EOB but with very poor stability requiring +2 Mod A to prevent fall  Stairs            Wheelchair Mobility     Tilt Bed    Modified Rankin (Stroke Patients Only)       Balance Overall balance assessment: Needs assistance Sitting-balance support: Bilateral upper extremity supported Sitting balance-Leahy Scale: Fair     Standing balance support: Bilateral upper extremity supported, During functional activity, Reliant on assistive device for balance Standing balance-Leahy Scale: Poor  Pertinent Vitals/Pain Pain Assessment Pain Assessment: No/denies pain    Home Living Family/patient expects to be discharged to:: Private residence Living Arrangements: Alone Available Help at Discharge: Personal care  attendant;Available 24 hours/day Type of Home: House Home Access: Ramped entrance       Home Layout: Two level;Able to live on main level with bedroom/bathroom Home Equipment: Rolling Walker (2 wheels);Shower seat;Grab bars - tub/shower;Grab bars - toilet;BSC/3in1;Wheelchair - manual Additional Comments: PCA 24/7    Prior Function Prior Level of Function : Needs assist  Cognitive Assist : Mobility (cognitive)           Mobility Comments: Mod Ind to SBA with limited household ambulation, assistance needed with bed mobility, w/c in the community, no falls but multiple near falls in the last 6 months per son ADLs Comments: Caregivers provide assistance with all ADLs/IADLs     Extremity/Trunk Assessment   Upper Extremity Assessment Upper Extremity Assessment: Generalized weakness    Lower Extremity Assessment Lower Extremity Assessment: Generalized weakness       Communication   Communication Communication: Hearing impairment Cueing Techniques: Verbal cues;Tactile cues;Visual cues  Cognition Arousal: Alert Behavior During Therapy: WFL for tasks assessed/performed Overall Cognitive Status: Within Functional Limits for tasks assessed                                          General Comments      Exercises     Assessment/Plan    PT Assessment Patient needs continued PT services  PT Problem List Decreased strength;Decreased activity tolerance;Decreased balance;Decreased mobility;Decreased knowledge of use of DME;Decreased safety awareness       PT Treatment Interventions DME instruction;Gait training;Functional mobility training;Therapeutic activities;Therapeutic exercise;Balance training;Patient/family education    PT Goals (Current goals can be found in the Care Plan section)  Acute Rehab PT Goals Patient Stated Goal: To get stronger PT Goal Formulation: With patient Time For Goal Achievement: 12/10/23 Potential to Achieve Goals: Fair     Frequency Min 1X/week     Co-evaluation               AM-PAC PT "6 Clicks" Mobility  Outcome Measure Help needed turning from your back to your side while in a flat bed without using bedrails?: A Little Help needed moving from lying on your back to sitting on the side of a flat bed without using bedrails?: A Lot Help needed moving to and from a bed to a chair (including a wheelchair)?: A Lot Help needed standing up from a chair using your arms (e.g., wheelchair or bedside chair)?: A Lot Help needed to walk in hospital room?: Total Help needed climbing 3-5 steps with a railing? : Total 6 Click Score: 11    End of Session Equipment Utilized During Treatment: Gait belt Activity Tolerance: Patient tolerated treatment well Patient left: in bed;with call bell/phone within reach;with bed alarm set;with family/visitor present;with nursing/sitter in room Nurse Communication: Mobility status PT Visit Diagnosis: Unsteadiness on feet (R26.81);Difficulty in walking, not elsewhere classified (R26.2);Muscle weakness (generalized) (M62.81)    Time: 2952-8413 PT Time Calculation (min) (ACUTE ONLY): 22 min   Charges:   PT Evaluation $PT Eval Moderate Complexity: 1 Mod   PT General Charges $$ ACUTE PT VISIT: 1 Visit       D. Scott Kody Vigil PT, DPT 11/27/23, 10:30 AM

## 2023-11-27 NOTE — ED Notes (Signed)
This tech and orientee helped change pt brief

## 2023-11-27 NOTE — Progress Notes (Signed)
Pharmacy Antibiotic Note  [name] is a 89 y.o. female admitted on 11/27/2023 with sepsis.  Pharmacy has been consulted for Vancomycin dosing.  -?UTI  Plan: Pt given Vancomycin 1750 mg once in ED 12/16. Scr improved 1.64>>1.33 Will continue Vancomycin 500 mg IV Q 24 hrs. Goal AUC 400-550. Expected AUC: 408 SCr used: 1.33, Vd used: 0.72 Cmin 13.3   -f/u renal fxn, cultures, length of therapy, etc   Temp (24hrs), Avg:98 F (36.7 C), Min:97.5 F (36.4 C), Max:98.3 F (36.8 C)   Recent Labs  Lab 11/26/23 2210 11/27/23 0354 11/27/23 0608 11/27/23 0811  WBC 18.2*  --  15.9*  --   CREATININE 1.64*  --   --  1.33*  LATICACIDVEN  --  2.0* 1.9  --     Estimated Creatinine Clearance: 26.1 mL/min (A) (by C-G formula based on SCr of 1.33 mg/dL (H)).    Allergies  Allergen Reactions   Codeine Nausea And Vomiting   Doxycycline Other (See Comments)    Flu-like s/s   Morphine Nausea And Vomiting and Anxiety    Hallucinations and anxiety   Tramadol Nausea And Vomiting and Other (See Comments)    Causes poor balance   Anakinra    Celecoxib    Hydroxychloroquine     Other reaction(s): Unknown   Adhesive [Tape] Rash   Amitriptyline Other (See Comments)    Caused nervousness. Patient states she has never taken this.   Ciprofloxacin Diarrhea   Clarithromycin Nausea Only    Flu like symptoms   Erythromycin Rash   Itraconazole Other (See Comments)    Elevated liver enzymes   Nsaids Nausea Only    Unknown   Penicillins Rash    Hives also. Doctor told her to never ever take pcn unless it is her only option   Zoloft [Sertraline Hcl] Other (See Comments)    Patient unaware of any reaction. Probably upset stomach    Antimicrobials this admission: 12/16 Vancomycin >>  12/16 Ceftriaxone >>   Microbiology results: 12/16 BCx: NG<12 hrs 12/16 UCx: Pending   Thank you for allowing pharmacy to be a part of this patient's care.  Bari Mantis PharmD Clinical  Pharmacist 11/27/2023

## 2023-11-27 NOTE — Progress Notes (Signed)
CODE SEPSIS - PHARMACY COMMUNICATION  **Broad Spectrum Antibiotics should be administered within 1 hour of Sepsis diagnosis**  Time Code Sepsis Called/Page Received: 0340  Antibiotics Ordered: Vancomycin  Time of 1st antibiotic administration: 0407  Otelia Sergeant, PharmD, [facility] 11/27/2023 3:41 AM

## 2023-11-27 NOTE — Assessment & Plan Note (Addendum)
-   The patient will be admitted to a medical telemetry bed. - We will continue antibiotic therapy with IV Rocephin and vancomycin. - We will stop her Macrobid. - We will follow urine and blood cultures. - She was given 2 L bolus of IV normal saline and will place her on continued hydration with IV lactated ringer.

## 2023-11-27 NOTE — Assessment & Plan Note (Addendum)
-   We will place her on scheduled and as needed DuoNebs. - Mucolytic therapy will be provided. - The patient will be on IV Rocephin as mentioned above. - We will add IV steroids. - I will hold off long-acting beta agonist. - We will stop her Macrobid for the possibility of induced interstitial lung disease.

## 2023-11-28 DIAGNOSIS — A4150 Gram-negative sepsis, unspecified: Secondary | ICD-10-CM | POA: Diagnosis not present

## 2023-11-28 DIAGNOSIS — N390 Urinary tract infection, site not specified: Secondary | ICD-10-CM | POA: Diagnosis not present

## 2023-11-28 LAB — BASIC METABOLIC PANEL
Anion gap: 7 (ref 5–15)
BUN: 34 mg/dL — ABNORMAL HIGH (ref 8–23)
CO2: 22 mmol/L (ref 22–32)
Calcium: 8.3 mg/dL — ABNORMAL LOW (ref 8.9–10.3)
Chloride: 97 mmol/L — ABNORMAL LOW (ref 98–111)
Creatinine, Ser: 1.11 mg/dL — ABNORMAL HIGH (ref 0.44–1.00)
GFR, Estimated: 47 mL/min — ABNORMAL LOW (ref >60)
Glucose, Bld: 113 mg/dL — ABNORMAL HIGH (ref 70–99)
Potassium: 4.5 mmol/L (ref 3.5–5.1)
Sodium: 126 mmol/L — ABNORMAL LOW (ref 135–145)

## 2023-11-28 LAB — URINE CULTURE: Culture: NO GROWTH

## 2023-11-28 LAB — CBC
HCT: 34 % — ABNORMAL LOW (ref 36.0–46.0)
Hemoglobin: 12 g/dL (ref 12.0–15.0)
MCH: 33.8 pg (ref 26.0–34.0)
MCHC: 35.3 g/dL (ref 30.0–36.0)
MCV: 95.8 fL (ref 80.0–100.0)
Platelets: 248 10*3/uL (ref 150–400)
RBC: 3.55 MIL/uL — ABNORMAL LOW (ref 3.87–5.11)
RDW: 12.2 % (ref 11.5–15.5)
WBC: 18.2 10*3/uL — ABNORMAL HIGH (ref 4.0–10.5)
nRBC: 0 % (ref 0.0–0.2)

## 2023-11-28 MED ORDER — ENSURE ENLIVE PO LIQD
237.0000 mL | Freq: Two times a day (BID) | ORAL | Status: DC
  Administered 2023-12-01 – 2023-12-05 (×6): 237 mL via ORAL

## 2023-11-28 MED ORDER — IPRATROPIUM-ALBUTEROL 0.5-2.5 (3) MG/3ML IN SOLN
3.0000 mL | Freq: Three times a day (TID) | RESPIRATORY_TRACT | Status: DC
  Administered 2023-11-28 (×2): 3 mL via RESPIRATORY_TRACT
  Filled 2023-11-28 (×2): qty 3

## 2023-11-28 NOTE — Progress Notes (Addendum)
Progress Note    [name]  ZOX:[id number] DOB: 01-14-1930  DOA: 11/27/2023 PCP: Marguarite Arbour, MD      Brief Narrative:    Medical records reviewed and are as summarized below:  [name] is a 89 y.o. female with medical history significant for hypertension, GERD, peripheral neuropathy, COPD, peripheral vascular disease, dementia, who was brought to the hospital because of general weakness, urinary frequency and urgency of bowel 1 to 2 weeks duration.  She also had nausea and vomiting.  She had been given Macrobid for an outpatient diagnosis of UTI.  Urine culture from 11/21/2023 showed methicillin-resistant Staph epidermidis.  Labs significant for sodium 126, BUN 39, creatinine 1.64, troponin 61, BNP 495.5, lactic acid 2, WBC 18.2   She was admitted to the hospital for severe sepsis secondary to acute UTI, COPD exacerbation, AKI and hyponatremia.   Assessment/Plan:   Principal Problem:   Sepsis due to gram-negative UTI (HCC) Active Problems:   COPD with acute exacerbation (HCC)   Hypothyroidism   GERD without esophagitis   Anxiety   Essential hypertension   Nutrition Problem: Increased nutrient needs Etiology: chronic illness (COPD)  Signs/Symptoms: estimated needs   Body mass index is 29.62 kg/m.   Severe sepsis secondary to acute UTI: Urine culture from 11/21/2023 showed methicillin-resistant Staph epidermidis.  Continue IV vancomycin.  Plan to treat for minimum of 5 days.  Discontinue IV ceftriaxone.   AKI: Improved.   Acute on chronic hyponatremia: Sodium level is stable.   COPD exacerbation: Continue prednisone and bronchodilators   Elevated BNP-495.5. No acute CHF at this time. 2D echo showed normal EF, moderate mitral stenosis   Comorbidities include hypertension, hypothyroidism, anxiety, GERD   Diet Order             Diet regular Room service appropriate? Yes; Fluid consistency: Thin  Diet effective now                             Consultants: None  Procedures: None    Medications:    acidophilus  1 capsule Oral Daily   vitamin C  1,000 mg Oral Daily   calcium-vitamin D  1 tablet Oral BID   cholecalciferol  5,000 Units Oral Daily   [START ON 12/12/2023] cyanocobalamin  1,000 mcg Intramuscular Q30 days   enoxaparin (LOVENOX) injection  30 mg Subcutaneous Q24H   feeding supplement  237 mL Oral BID BM   folic acid  1 mg Oral Daily   guaiFENesin  600 mg Oral BID   ipratropium-albuterol  3 mL Nebulization TID   levothyroxine  50 mcg Oral Q0600   mometasone-formoterol  2 puff Inhalation BID   multivitamin with minerals  1 tablet Oral Daily   pantoprazole  40 mg Oral Daily   predniSONE  40 mg Oral Q breakfast   propranolol ER  60 mg Oral Daily   sodium chloride flush  10 mL Intravenous Q12H   Continuous Infusions:  cefTRIAXone (ROCEPHIN)  IV Stopped (11/28/23 0529)   vancomycin 100 mL/hr at 11/28/23 0631     Anti-infectives (From admission, onward)    Start     Dose/Rate Route Frequency Ordered Stop   11/28/23 0500  vancomycin (VANCOREADY) IVPB 500 mg/100 mL        500 mg 100 mL/hr over 60 Minutes Intravenous Every 24 hours 11/27/23 1649     11/28/23 0000  vancomycin (VANCOREADY) IVPB 500 mg/100 mL  Status:  Discontinued        500 mg 100 mL/hr over 60 Minutes Intravenous  Once 11/27/23 0433 11/27/23 1649   11/27/23 0600  cefTRIAXone (ROCEPHIN) 2 g in sodium chloride 0.9 % 100 mL IVPB        2 g 200 mL/hr over 30 Minutes Intravenous Every 24 hours 11/27/23 0423 12/04/23 0559   11/27/23 0345  vancomycin (VANCOCIN) 1,750 mg in sodium chloride 0.9 % 500 mL IVPB        1,750 mg 258.8 mL/hr over 120 Minutes Intravenous  Once 11/27/23 0336 11/27/23 0644              Family Communication/Anticipated D/C date and plan/Code Status   DVT prophylaxis: enoxaparin (LOVENOX) injection 30 mg Start: 11/27/23 1000     Code Status: Full Code  Family Communication: Private  caregiver at the bedside Disposition Plan: Plan to discharge to SNF   Status is: Inpatient Remains inpatient appropriate because: Sepsis from UTI       Subjective:   Interval events noted.  She is confused and cannot provide any history.  Private caregiver at the bedside.  Objective:    Vitals:   11/27/23 1500 11/27/23 1830 11/27/23 2300 11/28/23 0817  BP: 126/67 (!) 96/53 (!) 146/67 (!) 112/59  Pulse: 69 60 66 66  Resp: 18 17 15 17   Temp: 98.1 F (36.7 C) 98 F (36.7 C) 97.8 F (36.6 C) (!) 97.5 F (36.4 C)  TempSrc: Oral Oral Oral Oral  SpO2: 99% 95% 96% 98%  Weight:   68.8 kg   Height:   5' (1.524 m)    No data found.   Intake/Output Summary (Last 24 hours) at 11/28/2023 1353 Last data filed at 11/28/2023 0631 Gross per 24 hour  Intake 372.97 ml  Output 300 ml  Net 72.97 ml   Filed Weights   11/26/23 2208 11/27/23 2300  Weight: 81.6 kg 68.8 kg    Exam:  GEN: NAD SKIN: Warm and dry EYES: No pallor or icterus ENT: MMM CV: RRR PULM: CTA B ABD: soft, ND, NT, +BS CNS: Alert but disoriented, non focal EXT: B/l leg edema, no tenderness        Data Reviewed:   I have personally reviewed following labs and imaging studies:  Labs: Labs show the following:   Basic Metabolic Panel: Recent Labs  Lab 11/26/23 2210 11/27/23 0811 11/28/23 0441  NA 126* 131* 126*  K 4.8 4.1 4.5  CL 93* 99 97*  CO2 24 22 22   GLUCOSE 111* 97 113*  BUN 39* 35* 34*  CREATININE 1.64* 1.33* 1.11*  CALCIUM 8.9 7.8* 8.3*   GFR Estimated Creatinine Clearance: 28.6 mL/min (A) (by C-G formula based on SCr of 1.11 mg/dL (H)). Liver Function Tests: No results for input(s): "AST", "ALT", "ALKPHOS", "BILITOT", "PROT", "ALBUMIN" in the last 168 hours. No results for input(s): "LIPASE", "AMYLASE" in the last 168 hours. No results for input(s): "AMMONIA" in the last 168 hours. Coagulation profile Recent Labs  Lab 11/27/23 0608  INR 1.2    CBC: Recent Labs  Lab  11/26/23 2210 11/27/23 0608 11/28/23 0441  WBC 18.2* 15.9* 18.2*  HGB 12.1 11.4* 12.0  HCT 35.9* 35.4* 34.0*  MCV 97.3 102.9* 95.8  PLT 202 186 248   Cardiac Enzymes: No results for input(s): "CKTOTAL", "CKMB", "CKMBINDEX", "TROPONINI" in the last 168 hours. BNP (last 3 results) No results for input(s): "PROBNP" in the last 8760 hours. CBG: No results for input(s): "GLUCAP" in  the last 168 hours. D-Dimer: No results for input(s): "DDIMER" in the last 72 hours. Hgb A1c: No results for input(s): "HGBA1C" in the last 72 hours. Lipid Profile: No results for input(s): "CHOL", "HDL", "LDLCALC", "TRIG", "CHOLHDL", "LDLDIRECT" in the last 72 hours. Thyroid function studies: No results for input(s): "TSH", "T4TOTAL", "T3FREE", "THYROIDAB" in the last 72 hours.  Invalid input(s): "FREET3" Anemia work up: No results for input(s): "VITAMINB12", "FOLATE", "FERRITIN", "TIBC", "IRON", "RETICCTPCT" in the last 72 hours. Sepsis Labs: Recent Labs  Lab 11/26/23 2210 11/27/23 0354 11/27/23 0608 11/27/23 0811 11/28/23 0441  PROCALCITON  --   --   --  2.87  --   WBC 18.2*  --  15.9*  --  18.2*  LATICACIDVEN  --  2.0* 1.9  --   --     Microbiology Recent Results (from the past 240 hours)  Blood culture (routine x 2)     Status: None (Preliminary result)   Collection Time: 11/27/23  3:54 AM   Specimen: BLOOD RIGHT ARM  Result Value Ref Range Status   Specimen Description BLOOD RIGHT ARM  Final   Special Requests   Final    BOTTLES DRAWN AEROBIC AND ANAEROBIC Blood Culture results may not be optimal due to an inadequate volume of blood received in culture bottles   Culture   Final    NO GROWTH 1 DAY Performed at [facility], [address] [place], [place] [address]    Report Status PENDING  Incomplete  Blood culture (routine x 2)     Status: None (Preliminary result)   Collection Time: 11/27/23  3:54 AM   Specimen: BLOOD LEFT ARM  Result Value Ref Range Status    Specimen Description BLOOD LEFT ARM  Final   Special Requests   Final    BOTTLES DRAWN AEROBIC AND ANAEROBIC Blood Culture results may not be optimal due to an inadequate volume of blood received in culture bottles   Culture   Final    NO GROWTH 1 DAY Performed at [facility], [address], [place], [place] [address]    Report Status PENDING  Incomplete  Urine Culture     Status: None   Collection Time: 11/27/23  3:54 AM   Specimen: Urine, Catheterized  Result Value Ref Range Status   Specimen Description   Final    URINE, CATHETERIZED Performed at [facility], [address] [place], [place] [address]    Special Requests   Final    NONE Performed at [facility], [address], [place], [place] [address]    Culture   Final    NO GROWTH Performed at [facility], [address] [place], [place] [address]    Report Status 11/28/2023 FINAL  Final    Procedures and diagnostic studies:  DG Chest 2 View Result Date: 11/27/2023 CLINICAL DATA:  Weakness EXAM: CHEST - 2 VIEW COMPARISON:  05/11/2023 FINDINGS: Left-sided pacing device. Small pleural effusions. Borderline cardiomegaly. Aortic atherosclerosis. No pneumothorax. Hardware in the cervical spine IMPRESSION: Small pleural effusions. Borderline cardiomegaly. Electronically Signed   By: [name] Pang M.D.   On: 11/27/2023 00:10               LOS: 1 day   Pati Thinnes  [facility]   Pager on [email or web address]. If 7PM-7AM, please contact night-coverage at [email or web address]     11/28/2023, 1:53 PM

## 2023-11-28 NOTE — Progress Notes (Signed)
Initial Nutrition Assessment  DOCUMENTATION CODES:   Not applicable  INTERVENTION:   -Liberalize diet to regular for widest variety of meal selections -Ensure Enlive po BID, each supplement provides 350 kcal and 20 grams of protein.  -MVI with minerals daily   NUTRITION DIAGNOSIS:   Increased nutrient needs related to chronic illness (COPD) as evidenced by estimated needs.  GOAL:   Patient will meet greater than or equal to 90% of their needs  MONITOR:   PO intake, Supplement acceptance  REASON FOR ASSESSMENT:   Consult Assessment of nutrition requirement/status  ASSESSMENT:   Pt with medical history significant for essential hypertension, GERD, peripheral neuropathy, COPD and and peripheral vascular disease, who presented with acute onset of generalized weakness with recent urinary frequency and urgency without dysuria or hematuria or flank pain, over the last 1 to 2 weeks PTA.  Pt admitted secondary to UTI and COPD exacerbation.   Reviewed I/O's: +73 ml x 24 hours  UOP: 300 ml x 24 hours  Spoke with pt at bedside, who was pleasantly confused at time of visit. Pt reports she has a good appetite and denies nay chewing or swallowing difficulties. Pt states "everyone tried to eat, but we all have our favorites. We just do the best we can".   No wt loss noted over the past year. Suspect wt on 11/26/23 is an outlier. Noted mild edema on lower extremities which may be masking true weight loss as well as fat and muscle depletions.   Medications reviewed and include vitamin C, oscal with vitamin D, vitamin D3, protonix, and prednisone.   Labs reviewed: Na: 126, CBGS: 95 (inpatient orders for glycemic control are none).    NUTRITION - FOCUSED PHYSICAL EXAM:  Flowsheet Row Most Recent Value  Orbital Region No depletion  Upper Arm Region No depletion  Thoracic and Lumbar Region No depletion  Buccal Region No depletion  Temple Region No depletion  Clavicle Bone Region No  depletion  Clavicle and Acromion Bone Region No depletion  Scapular Bone Region No depletion  Dorsal Hand No depletion  Patellar Region No depletion  Anterior Thigh Region No depletion  Posterior Calf Region No depletion  Edema (RD Assessment) Mild  Hair Reviewed  Eyes Reviewed  Mouth Reviewed  Skin Reviewed  Nails Reviewed       Diet Order:   Diet Order             Diet regular Room service appropriate? Yes; Fluid consistency: Thin  Diet effective now                   EDUCATION NEEDS:   Not appropriate for education at this time  Skin:  Skin Assessment: Reviewed RN Assessment  Last BM:  11/26/23  Height:   Ht Readings from Last 1 Encounters:  11/27/23 5' (1.524 m)    Weight:   Wt Readings from Last 1 Encounters:  11/27/23 68.8 kg    Ideal Body Weight:  45.5 kg  BMI:  Body mass index is 29.62 kg/m.  Estimated Nutritional Needs:   Kcal:  1550-1750  Protein:  75-90 grams  Fluid:  > 1.5 L    Levada Schilling, RD, LDN, CDCES Registered Dietitian III Certified Diabetes Care and Education Specialist If unable to reach this RD, please use "RD Inpatient" group chat on secure chat between hours of 8am-4 pm daily

## 2023-11-28 NOTE — Plan of Care (Signed)
Continuing IV vancomycin, vital signs stable. Turning patient q2hr d/t difficulty turning self in bed. Pt only oriented to self, told that she is completely oriented at baseline. Currently wearing mitts d/t trying to pull at IV line. Trialed removing and pt was still trying to pull at IV. Will trial again within the next 24 hours.   Problem: Fluid Volume: Goal: Hemodynamic stability will improve Outcome: Progressing   Problem: Clinical Measurements: Goal: Diagnostic test results will improve Outcome: Progressing Goal: Signs and symptoms of infection will decrease Outcome: Progressing   Problem: Respiratory: Goal: Ability to maintain adequate ventilation will improve Outcome: Progressing   Problem: Education: Goal: Knowledge of General Education information will improve Description: Including pain rating scale, medication(s)/side effects and non-pharmacologic comfort measures Outcome: Progressing   Problem: Health Behavior/Discharge Planning: Goal: Ability to manage health-related needs will improve Outcome: Progressing   Problem: Clinical Measurements: Goal: Ability to maintain clinical measurements within normal limits will improve Outcome: Progressing Goal: Will remain free from infection Outcome: Progressing Goal: Diagnostic test results will improve Outcome: Progressing Goal: Respiratory complications will improve Outcome: Progressing Goal: Cardiovascular complication will be avoided Outcome: Progressing   Problem: Activity: Goal: Risk for activity intolerance will decrease Outcome: Progressing   Problem: Nutrition: Goal: Adequate nutrition will be maintained Outcome: Progressing   Problem: Coping: Goal: Level of anxiety will decrease Outcome: Progressing   Problem: Elimination: Goal: Will not experience complications related to bowel motility Outcome: Progressing Goal: Will not experience complications related to urinary retention Outcome: Progressing    Problem: Pain Management: Goal: General experience of comfort will improve Outcome: Progressing   Problem: Safety: Goal: Ability to remain free from injury will improve Outcome: Progressing   Problem: Skin Integrity: Goal: Risk for impaired skin integrity will decrease Outcome: Progressing

## 2023-11-28 NOTE — TOC Initial Note (Signed)
Transition of Care [facility]) - Initial/Assessment Note    Patient Details  Name: [name] MRN: [id number] Date of Birth: [birth year]  Transition [facility]) CM/SW Contact:    Marlowe Sax, RN Phone Number: 11/28/2023, 11:31 AM  Clinical Narrative:                  Met with the patient and her caregiver in the room, FL2 completed, PASSR obtained, Bedsearch completed, will review bed offers once obtained Expected Discharge Plan: Skilled Nursing Facility Barriers to Discharge: SNF Pending bed offer   Patient Goals and CMS Choice            Expected Discharge Plan and Services   Discharge Planning Services: CM Consult   Living arrangements for the past 2 months: Single Family Home                 DME Arranged: N/A DME Agency: NA       HH Arranged: NA          Prior Living Arrangements/Services Living arrangements for the past 2 months: Single Family Home Lives with:: Self, Other (Comment) (caregivers) Patient language and need for interpreter reviewed:: Yes Do you feel safe going back to the place where you live?: Yes      Need for Family Participation in Patient Care: Yes (Comment) Care giver support system in place?: Yes (comment) Current home services: DME, Sitter Criminal Activity/Legal Involvement Pertinent to Current Situation/Hospitalization: No - Comment as needed  Activities of Daily Living   ADL Screening (condition at time of admission) Independently performs ADLs?: No Does the patient have a NEW difficulty with bathing/dressing/toileting/self-feeding that is expected to last >3 days?: Yes (Initiates electronic notice to provider for possible OT consult) Does the patient have a NEW difficulty with getting in/out of bed, walking, or climbing stairs that is expected to last >3 days?: Yes (Initiates electronic notice to provider for possible PT consult) Does the patient have a NEW difficulty with communication that is expected to last >3 days?:  No Is the patient deaf or have difficulty hearing?: No Does the patient have difficulty seeing, even when wearing glasses/contacts?: No Does the patient have difficulty concentrating, remembering, or making decisions?: Yes  Permission Sought/Granted   Permission granted to share information with : Yes, Verbal Permission Granted              Emotional Assessment Appearance:: Appears stated age Attitude/Demeanor/Rapport: Engaged, Unable to Assess Affect (typically observed): Unable to Assess Orientation: : Oriented to Self Alcohol / Substance Use: Not Applicable Psych Involvement: No (comment)  Admission diagnosis:  Generalized weakness [R53.1] AKI (acute kidney injury) (HCC) [N17.9] Sepsis due to urinary tract infection (HCC) [A41.9, N39.0] Sepsis due to gram-negative UTI (HCC) [A41.50, N39.0] Patient Active Problem List   Diagnosis Date Noted   Sepsis due to gram-negative UTI (HCC) 11/27/2023   COPD with acute exacerbation (HCC) 11/27/2023   GERD without esophagitis 11/27/2023   Anxiety 11/27/2023   Essential hypertension 11/27/2023   Acute hypoxic respiratory failure (HCC) 05/07/2023   Hypothyroidism 05/02/2023   AKI (acute kidney injury) (HCC) 08/25/2022   Severe sepsis (HCC) 08/25/2022   AMS (altered mental status) 08/25/2022   UTI (urinary tract infection) 08/25/2022   Mild cognitive impairment of uncertain or unknown etiology 04/15/2022   Bursitis of hip 04/15/2022   Presence of cardiac pacemaker 12/12/2021   Perceptive hearing loss, both sides 12/12/2021   Generalized weakness    Acute metabolic encephalopathy 07/23/2021  Hyponatremia 07/23/2021   Age-related osteoporosis without current pathological fracture 04/19/2018   Septic olecranon bursitis of left elbow 03/27/2018   Lymphedema 10/11/2017   Chronic venous insufficiency 10/11/2017   Leg pain 10/11/2017   Leg swelling 10/11/2017   COPD (chronic obstructive pulmonary disease) (HCC) 10/11/2017   Cervical  spondylosis with myelopathy 03/21/2017   Methotrexate, long term, current use 02/22/2017   Ataxia 10/04/2016   Restless leg syndrome 10/04/2016   Gastro-esophageal reflux disease without esophagitis 11/26/2015   Allergic rhinitis 11/26/2015   Cervical radiculitis 02/02/2015   Dysphagia, oropharyngeal phase 10/20/2014   Anemia 06/11/2014   Essential (primary) hypertension 06/11/2014   Atrioventricular block, complete (HCC) 11/12/2013   Left bundle-branch block, unspecified 11/13/2012   Cholelithiasis 05/21/2012   History of spinal surgery 12/20/2011   Anal fissure 12/20/2011   Rheumatoid arthritis (HCC) 05/11/2011   PCP:  Marguarite Arbour, MD Pharmacy:   [facility] - [place], [place] - [address] [place] [address] Phone: [phone] Fax: [phone]     Social Drivers of Health (SDOH) Social History: SDOH Screenings   Food Insecurity: Patient Unable To Answer (11/27/2023)  Housing: Patient Unable To Answer (11/27/2023)  Transportation Needs: Patient Unable To Answer (11/27/2023)  Utilities: Patient Unable To Answer (11/27/2023)  Tobacco Use: Low Risk  (11/26/2023)   SDOH Interventions:     Readmission Risk Interventions    07/25/2021   10:49 AM  Readmission Risk Prevention Plan  Transportation Screening Complete  PCP or Specialist Appt within 5-7 Days Complete  Home Care Screening Complete  Medication Review (RN CM) Complete

## 2023-11-28 NOTE — Progress Notes (Signed)
Pharmacy Antibiotic Note  [name] is a 89 y.o. female admitted on 11/27/2023 with sepsis.  PMH includes recurrent UTI, HTN, GERD, peripheral neuropathy, COPD and peripheral vascular disease . Patient presents to ED with generalized weakness s/p Macrobid for UTI. Pharmacy has been consulted for Vancomycin dosing after outpatient urine culture positive to Staph epi on 07/14/2023 and 11/21/2023 (R= FQ, Oxacillin, Bactrim).  Day# 2 of Antibiotics, Scr improved from 1.64>>1.11 WBC 18.2, Procalcitonin = 2.87 with unclear source of infection  Plan: Will continue Vancomycin 500 mg IV Q 24 hrs. Goal AUC 400-550. Expected AUC: 419 Expected Cmin 13.1 Scr used 1.11 2. Patient also on ceftriaxone per MD orders 3. Follow renal fxn, cultures and clinical progress to adjust therapy as needed   Temp (24hrs), Avg:97.9 F (36.6 C), Min:97.5 F (36.4 C), Max:98.1 F (36.7 C)   Recent Labs  Lab 11/26/23 2210 11/27/23 0354 11/27/23 0608 11/27/23 0811 11/28/23 0441  WBC 18.2*  --  15.9*  --  18.2*  CREATININE 1.64*  --   --  1.33* 1.11*  LATICACIDVEN  --  2.0* 1.9  --   --     Estimated Creatinine Clearance: 28.6 mL/min (A) (by C-G formula based on SCr of 1.11 mg/dL (H)).    Allergies  Allergen Reactions   Codeine Nausea And Vomiting   Doxycycline Other (See Comments)    Flu-like s/s   Morphine Nausea And Vomiting and Anxiety    Hallucinations and anxiety   Tramadol Nausea And Vomiting and Other (See Comments)    Causes poor balance   Anakinra    Celecoxib    Hydroxychloroquine     Other reaction(s): Unknown   Adhesive [Tape] Rash   Amitriptyline Other (See Comments)    Caused nervousness. Patient states she has never taken this.   Ciprofloxacin Diarrhea   Clarithromycin Nausea Only    Flu like symptoms   Erythromycin Rash   Itraconazole Other (See Comments)    Elevated liver enzymes   Nsaids Nausea Only    Unknown   Penicillins Rash    Hives also. Doctor told her to never  ever take pcn unless it is her only option   Zoloft [Sertraline Hcl] Other (See Comments)    Patient unaware of any reaction. Probably upset stomach    Antimicrobials this admission: 12/16 Vancomycin >>  12/16 Ceftriaxone >>   Microbiology results: 12/16 BCx: NG x 1 day 12/16 UCx: NG  Thank you for allowing pharmacy to be a part of this patient's care.  Markice Torbert Rodriguez-Guzman PharmD, BCPS 11/28/2023 10:23 AM

## 2023-11-28 NOTE — Plan of Care (Signed)
  Problem: Fluid Volume: Goal: Hemodynamic stability will improve Outcome: Progressing   Problem: Clinical Measurements: Goal: Diagnostic test results will improve Outcome: Progressing Goal: Signs and symptoms of infection will decrease Outcome: Progressing   Problem: Respiratory: Goal: Ability to maintain adequate ventilation will improve Outcome: Progressing   Problem: Education: Goal: Knowledge of General Education information will improve Description: Including pain rating scale, medication(s)/side effects and non-pharmacologic comfort measures Outcome: Progressing   Problem: Health Behavior/Discharge Planning: Goal: Ability to manage health-related needs will improve Outcome: Progressing   Problem: Clinical Measurements: Goal: Ability to maintain clinical measurements within normal limits will improve Outcome: Progressing Goal: Will remain free from infection Outcome: Progressing Goal: Diagnostic test results will improve Outcome: Progressing Goal: Respiratory complications will improve Outcome: Progressing Goal: Cardiovascular complication will be avoided Outcome: Progressing   Problem: Activity: Goal: Risk for activity intolerance will decrease Outcome: Progressing   Problem: Nutrition: Goal: Adequate nutrition will be maintained Outcome: Progressing   Problem: Coping: Goal: Level of anxiety will decrease Outcome: Progressing   Problem: Elimination: Goal: Will not experience complications related to bowel motility Outcome: Progressing Goal: Will not experience complications related to urinary retention Outcome: Progressing   Problem: Pain Management: Goal: General experience of comfort will improve Outcome: Progressing   Problem: Safety: Goal: Ability to remain free from injury will improve Outcome: Progressing   Problem: Skin Integrity: Goal: Risk for impaired skin integrity will decrease Outcome: Progressing

## 2023-11-28 NOTE — [place] FL2 (Signed)
[place] MEDICAID FL2 LEVEL OF CARE FORM     IDENTIFICATION  Patient Name: [name] Birthdate: August 26, 1930 Sex: female Admission Date (Current Location): 11/27/2023  [facility] and [place] Number:  Chiropodist and Address:  [facility], [address] [place], [place] [address]      Provider Number: [id number]  Attending Physician Name and Address:  Lurene Shadow, MD  Relative Name and Phone Number:  eric son [phone]    Current Level of Care: Hospital Recommended Level of Care: Skilled Nursing Facility Prior Approval Number:    Date Approved/Denied:   PASRR Number: [id number]  Discharge Plan: SNF    Current Diagnoses: Patient Active Problem List   Diagnosis Date Noted   Sepsis due to gram-negative UTI (HCC) 11/27/2023   COPD with acute exacerbation (HCC) 11/27/2023   GERD without esophagitis 11/27/2023   Anxiety 11/27/2023   Essential hypertension 11/27/2023   Acute hypoxic respiratory failure (HCC) 05/07/2023   Hypothyroidism 05/02/2023   AKI (acute kidney injury) (HCC) 08/25/2022   Severe sepsis (HCC) 08/25/2022   AMS (altered mental status) 08/25/2022   UTI (urinary tract infection) 08/25/2022   Mild cognitive impairment of uncertain or unknown etiology 04/15/2022   Bursitis of hip 04/15/2022   Presence of cardiac pacemaker 12/12/2021   Perceptive hearing loss, both sides 12/12/2021   Generalized weakness    Acute metabolic encephalopathy 07/23/2021   Hyponatremia 07/23/2021   Age-related osteoporosis without current pathological fracture 04/19/2018   Septic olecranon bursitis of left elbow 03/27/2018   Lymphedema 10/11/2017   Chronic venous insufficiency 10/11/2017   Leg pain 10/11/2017   Leg swelling 10/11/2017   COPD (chronic obstructive pulmonary disease) (HCC) 10/11/2017   Cervical spondylosis with myelopathy 03/21/2017   Methotrexate, long term, current use 02/22/2017   Ataxia 10/04/2016   Restless leg  syndrome 10/04/2016   Gastro-esophageal reflux disease without esophagitis 11/26/2015   Allergic rhinitis 11/26/2015   Cervical radiculitis 02/02/2015   Dysphagia, oropharyngeal phase 10/20/2014   Anemia 06/11/2014   Essential (primary) hypertension 06/11/2014   Atrioventricular block, complete (HCC) 11/12/2013   Left bundle-branch block, unspecified 11/13/2012   Cholelithiasis 05/21/2012   History of spinal surgery 12/20/2011   Anal fissure 12/20/2011   Rheumatoid arthritis (HCC) 05/11/2011    Orientation RESPIRATION BLADDER Height & Weight     Self  Normal External catheter Weight: 68.8 kg Height:  5' (152.4 cm)  BEHAVIORAL SYMPTOMS/MOOD NEUROLOGICAL BOWEL NUTRITION STATUS      Incontinent Diet (see dc summary)  AMBULATORY STATUS COMMUNICATION OF NEEDS Skin   Extensive Assist Verbally Normal                       Personal Care Assistance Level of Assistance  Bathing, Feeding, Dressing Bathing Assistance: Maximum assistance Feeding assistance: Limited assistance Dressing Assistance: Maximum assistance     Functional Limitations Info  Sight, Hearing, Speech Sight Info: Adequate Hearing Info: Adequate Speech Info: Adequate    SPECIAL CARE FACTORS FREQUENCY  PT (By licensed PT), OT (By licensed OT)     PT Frequency: 5 times per week OT Frequency: 5 times per week            Contractures Contractures Info: Not present    Additional Factors Info  Code Status, Allergies Code Status Info: full code Allergies Info: Codeine, Doxycycline, Morphine, Tramadol, Anakinra, Celecoxib, Hydroxychloroquine, Adhesive (Tape), Amitriptyline, Ciprofloxacin, Clarithromycin, Erythromycin, Itraconazole, Nsaids, Penicillins, Zoloft (Sertraline Hc  Current Medications (11/28/2023):  This is the current hospital active medication list Current Facility-Administered Medications  Medication Dose Route Frequency Provider Last Rate Last Admin   acetaminophen (TYLENOL)  tablet 650 mg  650 mg Oral Q6H PRN Mansy, Jan A, MD       Or   acetaminophen (TYLENOL) suppository 650 mg  650 mg Rectal Q6H PRN Mansy, Jan A, MD       acidophilus (RISAQUAD) capsule 1 capsule  1 capsule Oral Daily Mansy, Jan A, MD   1 capsule at 11/28/23 0937   albuterol (PROVENTIL) (2.5 MG/3ML) 0.083% nebulizer solution 2.5 mg  2.5 mg Nebulization Q6H PRN Otelia Sergeant, RPH   2.5 mg at 11/27/23 2309   ALPRAZolam (XANAX) tablet 0.25 mg  0.25 mg Oral BID PRN Mansy, Jan A, MD       ascorbic acid (VITAMIN C) tablet 1,000 mg  1,000 mg Oral Daily Mansy, Jan A, MD   1,000 mg at 11/28/23 8295   calcium-vitamin D (OSCAL WITH D) 500-5 MG-MCG per tablet 1 tablet  1 tablet Oral BID Mansy, Jan A, MD   1 tablet at 11/28/23 6213   cefTRIAXone (ROCEPHIN) 2 g in sodium chloride 0.9 % 100 mL IVPB  2 g Intravenous Q24H Mansy, Jan A, MD   Stopped at 11/28/23 0529   chlorpheniramine-HYDROcodone (TUSSIONEX) 10-8 MG/5ML suspension 5 mL  5 mL Oral Q12H PRN Mansy, Jan A, MD       cholecalciferol (VITAMIN D3) tablet 5,000 Units  5,000 Units Oral Daily Mansy, Jan A, MD   5,000 Units at 11/28/23 0937   [START ON 12/12/2023] cyanocobalamin (VITAMIN B12) injection 1,000 mcg  1,000 mcg Intramuscular Q30 days Mansy, Jan A, MD       enoxaparin (LOVENOX) injection 30 mg  30 mg Subcutaneous Q24H Mansy, Jan A, MD   30 mg at 11/28/23 0865   estradiol (ESTRACE) vaginal cream 1 Applicatorful  1 Applicatorful Vaginal QHS PRN Mansy, Jan A, MD       feeding supplement (ENSURE ENLIVE / ENSURE PLUS) liquid 237 mL  237 mL Oral BID BM Lurene Shadow, MD       folic acid (FOLVITE) tablet 1 mg  1 mg Oral Daily Mansy, Jan A, MD   1 mg at 11/28/23 0937   guaiFENesin (MUCINEX) 12 hr tablet 600 mg  600 mg Oral BID Mansy, Jan A, MD   600 mg at 11/28/23 7846   ipratropium-albuterol (DUONEB) 0.5-2.5 (3) MG/3ML nebulizer solution 3 mL  3 mL Nebulization TID Mansy, Jan A, MD       levothyroxine (SYNTHROID) tablet 50 mcg  50 mcg Oral Q0600 Mansy, Jan  A, MD   50 mcg at 11/28/23 0533   magnesium hydroxide (MILK OF MAGNESIA) suspension 30 mL  30 mL Oral Daily PRN Mansy, Jan A, MD       mometasone-formoterol [facility]) 200-5 MCG/ACT inhaler 2 puff  2 puff Inhalation BID Mansy, Jan A, MD   2 puff at 11/27/23 2107   multivitamin with minerals tablet 1 tablet  1 tablet Oral Daily Mansy, Jan A, MD   1 tablet at 11/28/23 0937   ondansetron (ZOFRAN) tablet 4 mg  4 mg Oral Q6H PRN Mansy, Jan A, MD       Or   ondansetron [facility]) injection 4 mg  4 mg Intravenous Q6H PRN Mansy, Jan A, MD       pantoprazole (PROTONIX) EC tablet 40 mg  40 mg Oral Daily Mansy, Vernetta Honey, MD  40 mg at 11/28/23 0937   predniSONE (DELTASONE) tablet 40 mg  40 mg Oral Q breakfast Mansy, Jan A, MD   40 mg at 11/28/23 4098   propranolol ER (INDERAL LA) 24 hr capsule 60 mg  60 mg Oral Daily Mansy, Jan A, MD   60 mg at 11/28/23 0940   sodium chloride flush (NS) 0.9 % injection 10 mL  10 mL Intravenous Q12H Delton [place], MD   10 mL at 11/28/23 1046   traZODone (DESYREL) tablet 25 mg  25 mg Oral QHS PRN Mansy, Jan A, MD   25 mg at 11/27/23 2308   vancomycin (VANCOREADY) IVPB 500 mg/100 mL  500 mg Intravenous Q24H Rockwell Alexandria, RPH 100 mL/hr at 11/28/23 0631 Infusion Verify at 11/28/23 0631     Discharge Medications: Please see discharge summary for a list of discharge medications.  Relevant Imaging Results:  Relevant Lab Results:   Additional Information SS [id number]  Marlowe Sax, RN

## 2023-11-29 DIAGNOSIS — N390 Urinary tract infection, site not specified: Secondary | ICD-10-CM | POA: Diagnosis not present

## 2023-11-29 DIAGNOSIS — A4150 Gram-negative sepsis, unspecified: Secondary | ICD-10-CM | POA: Diagnosis not present

## 2023-11-29 LAB — CBC WITH DIFFERENTIAL/PLATELET
Abs Immature Granulocytes: 0.41 10*3/uL — ABNORMAL HIGH (ref 0.00–0.07)
Basophils Absolute: 0.1 10*3/uL (ref 0.0–0.1)
Basophils Relative: 0 %
Eosinophils Absolute: 0.3 10*3/uL (ref 0.0–0.5)
Eosinophils Relative: 2 %
HCT: 35.5 % — ABNORMAL LOW (ref 36.0–46.0)
Hemoglobin: 12.3 g/dL (ref 12.0–15.0)
Immature Granulocytes: 2 %
Lymphocytes Relative: 7 %
Lymphs Abs: 1.2 10*3/uL (ref 0.7–4.0)
MCH: 33.4 pg (ref 26.0–34.0)
MCHC: 34.6 g/dL (ref 30.0–36.0)
MCV: 96.5 fL (ref 80.0–100.0)
Monocytes Absolute: 2.6 10*3/uL — ABNORMAL HIGH (ref 0.1–1.0)
Monocytes Relative: 14 %
Neutro Abs: 14.2 10*3/uL — ABNORMAL HIGH (ref 1.7–7.7)
Neutrophils Relative %: 75 %
Platelets: 209 10*3/uL (ref 150–400)
RBC: 3.68 MIL/uL — ABNORMAL LOW (ref 3.87–5.11)
RDW: 12.4 % (ref 11.5–15.5)
WBC: 18.9 10*3/uL — ABNORMAL HIGH (ref 4.0–10.5)
nRBC: 0 % (ref 0.0–0.2)

## 2023-11-29 LAB — BASIC METABOLIC PANEL
Anion gap: 11 (ref 5–15)
BUN: 36 mg/dL — ABNORMAL HIGH (ref 8–23)
CO2: 20 mmol/L — ABNORMAL LOW (ref 22–32)
Calcium: 8.5 mg/dL — ABNORMAL LOW (ref 8.9–10.3)
Chloride: 99 mmol/L (ref 98–111)
Creatinine, Ser: 1.08 mg/dL — ABNORMAL HIGH (ref 0.44–1.00)
GFR, Estimated: 48 mL/min — ABNORMAL LOW (ref >60)
Glucose, Bld: 93 mg/dL (ref 70–99)
Potassium: 4.3 mmol/L (ref 3.5–5.1)
Sodium: 130 mmol/L — ABNORMAL LOW (ref 135–145)

## 2023-11-29 NOTE — Plan of Care (Signed)
  Problem: Fluid Volume: Goal: Hemodynamic stability will improve Outcome: Progressing   Problem: Clinical Measurements: Goal: Diagnostic test results will improve Outcome: Progressing Goal: Signs and symptoms of infection will decrease Outcome: Progressing   Problem: Respiratory: Goal: Ability to maintain adequate ventilation will improve Outcome: Progressing   Problem: Education: Goal: Knowledge of General Education information will improve Description: Including pain rating scale, medication(s)/side effects and non-pharmacologic comfort measures Outcome: Progressing   Problem: Health Behavior/Discharge Planning: Goal: Ability to manage health-related needs will improve Outcome: Progressing   Problem: Clinical Measurements: Goal: Ability to maintain clinical measurements within normal limits will improve Outcome: Progressing Goal: Will remain free from infection Outcome: Progressing Goal: Diagnostic test results will improve Outcome: Progressing Goal: Respiratory complications will improve Outcome: Progressing Goal: Cardiovascular complication will be avoided Outcome: Progressing   Problem: Activity: Goal: Risk for activity intolerance will decrease Outcome: Progressing   Problem: Nutrition: Goal: Adequate nutrition will be maintained Outcome: Progressing   Problem: Coping: Goal: Level of anxiety will decrease Outcome: Progressing   Problem: Elimination: Goal: Will not experience complications related to bowel motility Outcome: Progressing Goal: Will not experience complications related to urinary retention Outcome: Progressing   Problem: Pain Management: Goal: General experience of comfort will improve Outcome: Progressing   Problem: Safety: Goal: Ability to remain free from injury will improve Outcome: Progressing   Problem: Skin Integrity: Goal: Risk for impaired skin integrity will decrease Outcome: Progressing

## 2023-11-29 NOTE — Progress Notes (Signed)
Progress Note    [name]  JXB:[id number] DOB: [birth year]  DOA: 11/27/2023 PCP: Marguarite Arbour, MD      Brief Narrative:    Medical records reviewed and are as summarized below:  [name] is a 89 y.o. female with medical history significant for hypertension, GERD, peripheral neuropathy, COPD, peripheral vascular disease, dementia, who was brought to the hospital because of general weakness, urinary frequency and urgency of bowel 1 to 2 weeks duration.  She also had nausea and vomiting.  She had been given Macrobid for an outpatient diagnosis of UTI.  Urine culture from 11/21/2023 showed methicillin-resistant Staph epidermidis.  Labs significant for sodium 126, BUN 39, creatinine 1.64, troponin 61, BNP 495.5, lactic acid 2, WBC 18.2   She was admitted to the hospital for severe sepsis secondary to acute UTI, COPD exacerbation, AKI and hyponatremia.   Assessment/Plan:   Principal Problem:   Sepsis due to gram-negative UTI (HCC) Active Problems:   COPD with acute exacerbation (HCC)   Hypothyroidism   GERD without esophagitis   Anxiety   Essential hypertension   Nutrition Problem: Increased nutrient needs Etiology: chronic illness (COPD)  Signs/Symptoms: estimated needs   Body mass index is 29.62 kg/m.   Severe sepsis secondary to acute UTI: Urine culture from 11/21/2023 showed methicillin-resistant Staph epidermidis.  Continue IV vancomycin through 12/01/2023.  IV ceftriaxone was discontinued on 11/28/2023. Persistent leukocytosis may be due to steroids.   AKI: Improved.   Acute on chronic hyponatremia: Sodium level is stable.   COPD exacerbation: Improved.  Continue bronchodilators.  Discontinue prednisone.   Elevated BNP-495.5. No acute CHF at this time. 2D echo showed normal EF, moderate mitral stenosis   Comorbidities include hypertension, hypothyroidism, anxiety, GERD   Diet Order             Diet regular Room service  appropriate? Yes; Fluid consistency: Thin  Diet effective now                            Consultants: None  Procedures: None    Medications:    acidophilus  1 capsule Oral Daily   vitamin C  1,000 mg Oral Daily   calcium-vitamin D  1 tablet Oral BID   cholecalciferol  5,000 Units Oral Daily   [START ON 12/12/2023] cyanocobalamin  1,000 mcg Intramuscular Q30 days   enoxaparin (LOVENOX) injection  30 mg Subcutaneous Q24H   feeding supplement  237 mL Oral BID BM   folic acid  1 mg Oral Daily   guaiFENesin  600 mg Oral BID   levothyroxine  50 mcg Oral Q0600   mometasone-formoterol  2 puff Inhalation BID   multivitamin with minerals  1 tablet Oral Daily   pantoprazole  40 mg Oral Daily   propranolol ER  60 mg Oral Daily   sodium chloride flush  10 mL Intravenous Q12H   Continuous Infusions:  vancomycin 100 mL/hr at 11/28/23 0631     Anti-infectives (From admission, onward)    Start     Dose/Rate Route Frequency Ordered Stop   11/28/23 0500  vancomycin (VANCOREADY) IVPB 500 mg/100 mL        500 mg 100 mL/hr over 60 Minutes Intravenous Every 24 hours 11/27/23 1649 12/01/23 1259   11/28/23 0000  vancomycin (VANCOREADY) IVPB 500 mg/100 mL  Status:  Discontinued        500 mg 100 mL/hr over 60 Minutes Intravenous  Once 11/27/23 0433 11/27/23 1649   11/27/23 0600  cefTRIAXone (ROCEPHIN) 2 g in sodium chloride 0.9 % 100 mL IVPB  Status:  Discontinued        2 g 200 mL/hr over 30 Minutes Intravenous Every 24 hours 11/27/23 0423 11/28/23 1403   11/27/23 0345  vancomycin (VANCOCIN) 1,750 mg in sodium chloride 0.9 % 500 mL IVPB        1,750 mg 258.8 mL/hr over 120 Minutes Intravenous  Once 11/27/23 0336 11/27/23 0644              Family Communication/Anticipated D/C date and plan/Code Status   DVT prophylaxis: enoxaparin (LOVENOX) injection 30 mg Start: 11/27/23 1000     Code Status: Full Code  Family Communication: Private caregiver at the  bedside Disposition Plan: Plan to discharge to SNF   Status is: Inpatient Remains inpatient appropriate because: Sepsis from UTI       Subjective:   Interval events noted.  She was agitated and combative overnight.  She is confused and cannot provide any history.  Private caregiver was at the bedside.  Objective:    Vitals:   11/28/23 1447 11/28/23 2021 11/28/23 2225 11/29/23 0727  BP: (!) 118/48  (!) 172/90 (!) 151/73  Pulse: (!) 57  79 71  Resp: 16  18 16   Temp: 97.9 F (36.6 C)  (!) 97.5 F (36.4 C) 97.7 F (36.5 C)  TempSrc:      SpO2: 95% 93% 95% 99%  Weight:      Height:       No data found.   Intake/Output Summary (Last 24 hours) at 11/29/2023 1228 Last data filed at 11/29/2023 1059 Gross per 24 hour  Intake 120 ml  Output 100 ml  Net 20 ml   Filed Weights   11/26/23 2208 11/27/23 2300  Weight: 81.6 kg 68.8 kg    Exam:  GEN: NAD SKIN: Warm and dry EYES: No pallor or icterus ENT: MMM CV: RRR PULM: CTA B ABD: soft, ND, NT, +BS CNS: Alert but confused, non focal EXT: Chronic edema and erythema of bilateral legs       Data Reviewed:   I have personally reviewed following labs and imaging studies:  Labs: Labs show the following:   Basic Metabolic Panel: Recent Labs  Lab 11/26/23 2210 11/27/23 0811 11/28/23 0441 11/29/23 0752  NA 126* 131* 126* 130*  K 4.8 4.1 4.5 4.3  CL 93* 99 97* 99  CO2 24 22 22  20*  GLUCOSE 111* 97 113* 93  BUN 39* 35* 34* 36*  CREATININE 1.64* 1.33* 1.11* 1.08*  CALCIUM 8.9 7.8* 8.3* 8.5*   GFR Estimated Creatinine Clearance: 29.4 mL/min (A) (by C-G formula based on SCr of 1.08 mg/dL (H)). Liver Function Tests: No results for input(s): "AST", "ALT", "ALKPHOS", "BILITOT", "PROT", "ALBUMIN" in the last 168 hours. No results for input(s): "LIPASE", "AMYLASE" in the last 168 hours. No results for input(s): "AMMONIA" in the last 168 hours. Coagulation profile Recent Labs  Lab 11/27/23 0608  INR 1.2     CBC: Recent Labs  Lab 11/26/23 2210 11/27/23 0608 11/28/23 0441 11/29/23 0846  WBC 18.2* 15.9* 18.2* 18.9*  NEUTROABS  --   --   --  14.2*  HGB 12.1 11.4* 12.0 12.3  HCT 35.9* 35.4* 34.0* 35.5*  MCV 97.3 102.9* 95.8 96.5  PLT 202 186 248 209   Cardiac Enzymes: No results for input(s): "CKTOTAL", "CKMB", "CKMBINDEX", "TROPONINI" in the last 168 hours. BNP (last 3  results) No results for input(s): "PROBNP" in the last 8760 hours. CBG: No results for input(s): "GLUCAP" in the last 168 hours. D-Dimer: No results for input(s): "DDIMER" in the last 72 hours. Hgb A1c: No results for input(s): "HGBA1C" in the last 72 hours. Lipid Profile: No results for input(s): "CHOL", "HDL", "LDLCALC", "TRIG", "CHOLHDL", "LDLDIRECT" in the last 72 hours. Thyroid function studies: No results for input(s): "TSH", "T4TOTAL", "T3FREE", "THYROIDAB" in the last 72 hours.  Invalid input(s): "FREET3" Anemia work up: No results for input(s): "VITAMINB12", "FOLATE", "FERRITIN", "TIBC", "IRON", "RETICCTPCT" in the last 72 hours. Sepsis Labs: Recent Labs  Lab 11/26/23 2210 11/27/23 0354 11/27/23 1610 11/27/23 0811 11/28/23 0441 11/29/23 0846  PROCALCITON  --   --   --  2.87  --   --   WBC 18.2*  --  15.9*  --  18.2* 18.9*  LATICACIDVEN  --  2.0* 1.9  --   --   --     Microbiology Recent Results (from the past 240 hours)  Blood culture (routine x 2)     Status: None (Preliminary result)   Collection Time: 11/27/23  3:54 AM   Specimen: BLOOD RIGHT ARM  Result Value Ref Range Status   Specimen Description BLOOD RIGHT ARM  Final   Special Requests   Final    BOTTLES DRAWN AEROBIC AND ANAEROBIC Blood Culture results may not be optimal due to an inadequate volume of blood received in culture bottles   Culture   Final    NO GROWTH 2 DAYS Performed at [facility], [address] [place], [place] [address]    Report Status PENDING  Incomplete  Blood culture (routine x 2)      Status: None (Preliminary result)   Collection Time: 11/27/23  3:54 AM   Specimen: BLOOD LEFT ARM  Result Value Ref Range Status   Specimen Description BLOOD LEFT ARM  Final   Special Requests   Final    BOTTLES DRAWN AEROBIC AND ANAEROBIC Blood Culture results may not be optimal due to an inadequate volume of blood received in culture bottles   Culture   Final    NO GROWTH 2 DAYS Performed at [facility], [address], [place], [place] [address]    Report Status PENDING  Incomplete  Urine Culture     Status: None   Collection Time: 11/27/23  3:54 AM   Specimen: Urine, Catheterized  Result Value Ref Range Status   Specimen Description   Final    URINE, CATHETERIZED Performed at [facility], [address], [place], [place] [address]    Special Requests   Final    NONE Performed at [facility], [address], [place], [place] [address]    Culture   Final    NO GROWTH Performed at [facility], [address], [place], [place] [address]    Report Status 11/28/2023 FINAL  Final    Procedures and diagnostic studies:  No results found.              LOS: 2 days   Terrin Meddaugh  [facility]   Pager on [email or web address]. If 7PM-7AM, please contact night-coverage at [email or web address]     11/29/2023, 12:28 PM

## 2023-11-29 NOTE — Progress Notes (Signed)
Physical Therapy Treatment Patient Details Name: [name] MRN: [id number] DOB: [birth year] Today's Date: 11/29/2023   History of Present Illness Pt is a 89 y.o. Caucasian female with medical history significant for essential hypertension, GERD, peripheral neuropathy, COPD and and peripheral vascular disease, who presented to the emergency room with acute onset of generalized weakness with recent urinary frequency and urgency. MD assessment includes sepsis due to gram-negative UTI and COPD with acute exacerbation.    PT Comments  Pt pleasantly confused, caregiver present and reports she has been weaker recently, but very limited since she has been here.  She showed good effort with getting to EOB, did need assist as well as direct assist with all aspects of mobility/transfers.  Pt tends to lean hips back and needs verbal and tactile cuing to insure forward weight shift into walker.  Overall good effort, but needing constant guidance to insure she neither tries to sit before she is over the landing surface or even simply to insure she stayed up in the walker.  Pt will benefit from further PT, continue with POC.     If plan is discharge home, recommend the following: A lot of help with walking and/or transfers;A little help with bathing/dressing/bathroom;Assistance with cooking/housework;Direct supervision/assist for medications management;Assist for transportation;Help with stairs or ramp for entrance   Can travel by private vehicle     No  Equipment Recommendations  None recommended by PT    Recommendations for Other Services       Precautions / Restrictions Precautions Precautions: Fall Restrictions Weight Bearing Restrictions Per Provider Order: No     Mobility  Bed Mobility Overal bed mobility: Needs Assistance Bed Mobility: Supine to Sit, Sit to Supine     Supine to sit: Mod assist, Max assist Sit to supine: Mod assist   General bed mobility comments: Pt showed good  effort but needed mod assist with LEs and trunk needed plenty of cuing and encouragement with transition to rise    Transfers Overall transfer level: Needs assistance Equipment used: Rolling walker (2 wheels) Transfers: Sit to/from Stand Sit to Stand: +2 safety/equipment, From elevated surface, Mod assist           General transfer comment: Pt was able to rise to standing but self selected keeping hips back and struggling to attain upright posture in walker    Ambulation/Gait Ambulation/Gait assistance: Mod assist Gait Distance (Feet): 4 Feet Assistive device: Rolling walker (2 wheels)         General Gait Details: Pt managed to take a few shuffling steps from bed to recliner, did need constant cuing/guarding but did not have LOBs (tactile cuing to keep hips forward in walker)   Stairs             Wheelchair Mobility     Tilt Bed    Modified Rankin (Stroke Patients Only)       Balance Overall balance assessment: Needs assistance Sitting-balance support: Bilateral upper extremity supported Sitting balance-Leahy Scale: Fair     Standing balance support: Bilateral upper extremity supported, During functional activity, Reliant on assistive device for balance Standing balance-Leahy Scale: Poor Standing balance comment: tending to lean back w/o constant cuing and guarding                            Cognition Arousal: Alert Behavior During Therapy: Restless Overall Cognitive Status: Impaired/Different from baseline (daughter states "She is seeing bugs so not yet her normal.")  General Comments: Pt very pleasant, mild confusion t/o but able to participate well with consistent, simle cuing        Exercises      General Comments        Pertinent Vitals/Pain Pain Assessment Pain Assessment: Faces Faces Pain Scale: Hurts even more Pain Location: At rest pt c/o more pain in LEs than in back or  shoulder, however back and shoulder did appear to be a very signficant limiter during attempted mobility    Home Living                          Prior Function            PT Goals (current goals can now be found in the care plan section) Progress towards PT goals: Progressing toward goals    Frequency    Min 1X/week      PT Plan      Co-evaluation              AM-PAC PT "6 Clicks" Mobility   Outcome Measure  Help needed turning from your back to your side while in a flat bed without using bedrails?: A Little Help needed moving from lying on your back to sitting on the side of a flat bed without using bedrails?: A Lot Help needed moving to and from a bed to a chair (including a wheelchair)?: A Lot Help needed standing up from a chair using your arms (e.g., wheelchair or bedside chair)?: A Lot Help needed to walk in hospital room?: Total Help needed climbing 3-5 steps with a railing? : Total 6 Click Score: 11    End of Session Equipment Utilized During Treatment: Gait belt Activity Tolerance: Patient tolerated treatment well Patient left: with chair alarm set;with call bell/phone within reach Nurse Communication: Mobility status PT Visit Diagnosis: Unsteadiness on feet (R26.81);Difficulty in walking, not elsewhere classified (R26.2);Muscle weakness (generalized) (M62.81)     Time: 1610-9604 PT Time Calculation (min) (ACUTE ONLY): 18 min  Charges:    $Therapeutic Activity: 8-22 mins PT General Charges $$ ACUTE PT VISIT: 1 Visit                     Malachi Pro, DPT 11/29/2023, 1:05 PM

## 2023-11-29 NOTE — Plan of Care (Signed)
  Problem: Fluid Volume: Goal: Hemodynamic stability will improve Outcome: Progressing   Problem: Clinical Measurements: Goal: Diagnostic test results will improve Outcome: Progressing Goal: Signs and symptoms of infection will decrease Outcome: Progressing   Problem: Respiratory: Goal: Ability to maintain adequate ventilation will improve Outcome: Progressing   Problem: Clinical Measurements: Goal: Ability to maintain clinical measurements within normal limits will improve Outcome: Progressing Goal: Will remain free from infection Outcome: Progressing Goal: Diagnostic test results will improve Outcome: Progressing Goal: Respiratory complications will improve Outcome: Progressing Goal: Cardiovascular complication will be avoided Outcome: Progressing   Problem: Activity: Goal: Risk for activity intolerance will decrease Outcome: Progressing

## 2023-11-29 NOTE — Progress Notes (Signed)
Pharmacy Antibiotic Note  [name] is a 89 y.o. female admitted on 11/27/2023 with sepsis.  PMH includes recurrent UTI, HTN, GERD, peripheral neuropathy, COPD and peripheral vascular disease . Patient presents to ED with generalized weakness s/p Macrobid for UTI. Pharmacy has been consulted for Vancomycin dosing after outpatient urine culture positive to Staph epi on 07/14/2023 and 11/21/2023 (R= FQ, Oxacillin, Bactrim).  Day# 3 of Antibiotics, Scr improved from 1.64>>1.11 WBC 18.2, Procalcitonin = 2.87 with unclear source of infection  Plan: Will continue Vancomycin 500 mg IV Q 24 hrs. Per provider - plan to complete 5 days Goal AUC 400-550. Expected AUC: 419 Expected Cmin 13.1 Scr used 1.11 2. Ceftriaxone was discontinued 12/17 3. Follow renal fxn, cultures and clinical progress to adjust therapy as needed   Temp (24hrs), Avg:97.7 F (36.5 C), Min:97.5 F (36.4 C), Max:97.9 F (36.6 C)   Recent Labs  Lab 11/26/23 2210 11/27/23 0354 11/27/23 0608 11/27/23 0811 11/28/23 0441 11/29/23 0752 11/29/23 0846  WBC 18.2*  --  15.9*  --  18.2*  --  18.9*  CREATININE 1.64*  --   --  1.33* 1.11* 1.08*  --   LATICACIDVEN  --  2.0* 1.9  --   --   --   --     Estimated Creatinine Clearance: 29.4 mL/min (A) (by C-G formula based on SCr of 1.08 mg/dL (H)).    Allergies  Allergen Reactions   Codeine Nausea And Vomiting   Doxycycline Other (See Comments)    Flu-like s/s   Morphine Nausea And Vomiting and Anxiety    Hallucinations and anxiety   Tramadol Nausea And Vomiting and Other (See Comments)    Causes poor balance   Anakinra    Celecoxib    Hydroxychloroquine     Other reaction(s): Unknown   Adhesive [Tape] Rash   Amitriptyline Other (See Comments)    Caused nervousness. Patient states she has never taken this.   Ciprofloxacin Diarrhea   Clarithromycin Nausea Only    Flu like symptoms   Erythromycin Rash   Itraconazole Other (See Comments)    Elevated liver enzymes    Nsaids Nausea Only    Unknown   Penicillins Rash    Hives also. Doctor told her to never ever take pcn unless it is her only option   Zoloft [Sertraline Hcl] Other (See Comments)    Patient unaware of any reaction. Probably upset stomach    Antimicrobials this admission: 12/16 Vancomycin >>  12/16 Ceftriaxone >> 12/17  Microbiology results: 12/16 BCx: NG x 1 day 12/16 UCx: NG  Thank you for allowing pharmacy to be a part of this patient's care.  Tawana Pasch Rodriguez-Guzman PharmD, BCPS 11/29/2023 10:11 AM

## 2023-11-29 NOTE — Progress Notes (Signed)
Pt started to get out of bed and staff went to check on patient, patient is with home caregiver at the bedside. Patient wanting to get out of bed and started kicking, grabbing staffs and the caregiver. Staffs tried putting mittens on patient, she started biting and jerking arm away from the staff. Will not follow command and not redirectable. Primary Nurse and Thereasa Parkin went to deescalate and give her anxiety medicine and cough medicine to help patient calm down, patient started spitting on the primary nurse. Phlebotomist came to draw blood. Patient refused and just wanted a cup of water. Phlebotomist went to get cup of water, patient requested for it to be poured out of the cup as she didn't see them put water in it and just get water from the faucet so she can see it. Phleb followed as requested by patient, patient took a sip and spit it on the Phlebotomist.

## 2023-11-30 DIAGNOSIS — N390 Urinary tract infection, site not specified: Secondary | ICD-10-CM | POA: Diagnosis not present

## 2023-11-30 DIAGNOSIS — A4150 Gram-negative sepsis, unspecified: Secondary | ICD-10-CM | POA: Diagnosis not present

## 2023-11-30 NOTE — Progress Notes (Signed)
Progress Note    [name]  ZOX:[id number] DOB: [birth year]  DOA: 11/27/2023 PCP: Marguarite Arbour, MD      Brief Narrative:    Medical records reviewed and are as summarized below:  [name] is a 89 y.o. female with medical history significant for hypertension, GERD, peripheral neuropathy, COPD, peripheral vascular disease, dementia, who was brought to the hospital because of general weakness, urinary frequency and urgency of bowel 1 to 2 weeks duration.  She also had nausea and vomiting.  She had been given Macrobid for an outpatient diagnosis of UTI.  Urine culture from 11/21/2023 showed methicillin-resistant Staph epidermidis.  Labs significant for sodium 126, BUN 39, creatinine 1.64, troponin 61, BNP 495.5, lactic acid 2, WBC 18.2   She was admitted to the hospital for severe sepsis secondary to acute UTI, COPD exacerbation, AKI and hyponatremia.   Assessment/Plan:   Principal Problem:   Sepsis due to gram-negative UTI (HCC) Active Problems:   COPD with acute exacerbation (HCC)   Hypothyroidism   GERD without esophagitis   Anxiety   Essential hypertension   Nutrition Problem: Increased nutrient needs Etiology: chronic illness (COPD)  Signs/Symptoms: estimated needs   Body mass index is 29.62 kg/m.   Severe sepsis secondary to acute UTI: Urine culture from 11/21/2023 showed methicillin-resistant Staph epidermidis.  Continue IV vancomycin through 12/01/2023.  IV ceftriaxone was discontinued on 11/28/2023. Persistent leukocytosis may be due to steroids.  Repeat CBC tomorrow   AKI: Improved.   Acute on chronic hyponatremia: Sodium level is stable and she is asymptomatic.   COPD exacerbation: Improved.  Continue bronchodilators.  Prednisone has been discontinued.   Elevated BNP-495.5. No acute CHF at this time. 2D echo showed normal EF, moderate mitral stenosis   Comorbidities include hypertension, hypothyroidism, anxiety, GERD   I  called her sons, Minerva Areola and Dorinda Hill, but there was no response.  I left a voice message for them to call back if they have any questions.    Diet Order             Diet regular Room service appropriate? Yes; Fluid consistency: Thin  Diet effective now                            Consultants: None  Procedures: None    Medications:    acidophilus  1 capsule Oral Daily   vitamin C  1,000 mg Oral Daily   calcium-vitamin D  1 tablet Oral BID   cholecalciferol  5,000 Units Oral Daily   [START ON 12/12/2023] cyanocobalamin  1,000 mcg Intramuscular Q30 days   enoxaparin (LOVENOX) injection  30 mg Subcutaneous Q24H   feeding supplement  237 mL Oral BID BM   folic acid  1 mg Oral Daily   guaiFENesin  600 mg Oral BID   levothyroxine  50 mcg Oral Q0600   mometasone-formoterol  2 puff Inhalation BID   multivitamin with minerals  1 tablet Oral Daily   pantoprazole  40 mg Oral Daily   propranolol ER  60 mg Oral Daily   sodium chloride flush  10 mL Intravenous Q12H   Continuous Infusions:  vancomycin 500 mg (11/30/23 1134)     Anti-infectives (From admission, onward)    Start     Dose/Rate Route Frequency Ordered Stop   11/28/23 0500  vancomycin (VANCOREADY) IVPB 500 mg/100 mL        500 mg 100 mL/hr over 60  Minutes Intravenous Every 24 hours 11/27/23 1649 12/02/23 0959   11/28/23 0000  vancomycin (VANCOREADY) IVPB 500 mg/100 mL  Status:  Discontinued        500 mg 100 mL/hr over 60 Minutes Intravenous  Once 11/27/23 0433 11/27/23 1649   11/27/23 0600  cefTRIAXone (ROCEPHIN) 2 g in sodium chloride 0.9 % 100 mL IVPB  Status:  Discontinued        2 g 200 mL/hr over 30 Minutes Intravenous Every 24 hours 11/27/23 0423 11/28/23 1403   11/27/23 0345  vancomycin (VANCOCIN) 1,750 mg in sodium chloride 0.9 % 500 mL IVPB        1,750 mg 258.8 mL/hr over 120 Minutes Intravenous  Once 11/27/23 0336 11/27/23 0644              Family Communication/Anticipated D/C date  and plan/Code Status   DVT prophylaxis: enoxaparin (LOVENOX) injection 30 mg Start: 11/27/23 1000     Code Status: Full Code  Family Communication: Private caregiver at the bedside Disposition Plan: Plan to discharge to SNF   Status is: Inpatient Remains inpatient appropriate because: Sepsis from UTI       Subjective:   Interval events noted.  She is confused and cannot provide any history. Meriam Sprague, private caregiver, was at the bedside.  Objective:    Vitals:   11/29/23 2229 11/29/23 2330 11/30/23 0742 11/30/23 1513  BP: (!) 187/82 (!) 157/82 (!) 167/88 131/70  Pulse: 64 72 63 64  Resp: 18  16 18   Temp:   97.6 F (36.4 C) 97.6 F (36.4 C)  TempSrc:      SpO2: 98%  97% 100%  Weight:      Height:       No data found.   Intake/Output Summary (Last 24 hours) at 11/30/2023 1530 Last data filed at 11/30/2023 0500 Gross per 24 hour  Intake 360 ml  Output 750 ml  Net -390 ml   Filed Weights   11/26/23 2208 11/27/23 2300  Weight: 81.6 kg 68.8 kg    Exam:  GEN: NAD SKIN: Warm and dry EYES: No pallor or icterus ENT: MMM CV: RRR PULM: CTA B ABD: soft, ND, NT, +BS CNS: Alert but disoriented, non focal EXT: Chronic erythema and edema of bilateral legs.      Data Reviewed:   I have personally reviewed following labs and imaging studies:  Labs: Labs show the following:   Basic Metabolic Panel: Recent Labs  Lab 11/26/23 2210 11/27/23 0811 11/28/23 0441 11/29/23 0752  NA 126* 131* 126* 130*  K 4.8 4.1 4.5 4.3  CL 93* 99 97* 99  CO2 24 22 22  20*  GLUCOSE 111* 97 113* 93  BUN 39* 35* 34* 36*  CREATININE 1.64* 1.33* 1.11* 1.08*  CALCIUM 8.9 7.8* 8.3* 8.5*   GFR Estimated Creatinine Clearance: 29.4 mL/min (A) (by C-G formula based on SCr of 1.08 mg/dL (H)). Liver Function Tests: No results for input(s): "AST", "ALT", "ALKPHOS", "BILITOT", "PROT", "ALBUMIN" in the last 168 hours. No results for input(s): "LIPASE", "AMYLASE" in the last 168  hours. No results for input(s): "AMMONIA" in the last 168 hours. Coagulation profile Recent Labs  Lab 11/27/23 0608  INR 1.2    CBC: Recent Labs  Lab 11/26/23 2210 11/27/23 0608 11/28/23 0441 11/29/23 0846  WBC 18.2* 15.9* 18.2* 18.9*  NEUTROABS  --   --   --  14.2*  HGB 12.1 11.4* 12.0 12.3  HCT 35.9* 35.4* 34.0* 35.5*  MCV 97.3 102.9* 95.8  96.5  PLT 202 186 248 209   Cardiac Enzymes: No results for input(s): "CKTOTAL", "CKMB", "CKMBINDEX", "TROPONINI" in the last 168 hours. BNP (last 3 results) No results for input(s): "PROBNP" in the last 8760 hours. CBG: No results for input(s): "GLUCAP" in the last 168 hours. D-Dimer: No results for input(s): "DDIMER" in the last 72 hours. Hgb A1c: No results for input(s): "HGBA1C" in the last 72 hours. Lipid Profile: No results for input(s): "CHOL", "HDL", "LDLCALC", "TRIG", "CHOLHDL", "LDLDIRECT" in the last 72 hours. Thyroid function studies: No results for input(s): "TSH", "T4TOTAL", "T3FREE", "THYROIDAB" in the last 72 hours.  Invalid input(s): "FREET3" Anemia work up: No results for input(s): "VITAMINB12", "FOLATE", "FERRITIN", "TIBC", "IRON", "RETICCTPCT" in the last 72 hours. Sepsis Labs: Recent Labs  Lab 11/26/23 2210 11/27/23 0354 11/27/23 1610 11/27/23 0811 11/28/23 0441 11/29/23 0846  PROCALCITON  --   --   --  2.87  --   --   WBC 18.2*  --  15.9*  --  18.2* 18.9*  LATICACIDVEN  --  2.0* 1.9  --   --   --     Microbiology Recent Results (from the past 240 hours)  Blood culture (routine x 2)     Status: None (Preliminary result)   Collection Time: 11/27/23  3:54 AM   Specimen: BLOOD RIGHT ARM  Result Value Ref Range Status   Specimen Description BLOOD RIGHT ARM  Final   Special Requests   Final    BOTTLES DRAWN AEROBIC AND ANAEROBIC Blood Culture results may not be optimal due to an inadequate volume of blood received in culture bottles   Culture   Final    NO GROWTH 3 DAYS Performed at [facility] [address], [place], [place] [address]    Report Status PENDING  Incomplete  Blood culture (routine x 2)     Status: None (Preliminary result)   Collection Time: 11/27/23  3:54 AM   Specimen: BLOOD LEFT ARM  Result Value Ref Range Status   Specimen Description BLOOD LEFT ARM  Final   Special Requests   Final    BOTTLES DRAWN AEROBIC AND ANAEROBIC Blood Culture results may not be optimal due to an inadequate volume of blood received in culture bottles   Culture   Final    NO GROWTH 3 DAYS Performed at [facility], [address], [place], [place] [address]    Report Status PENDING  Incomplete  Urine Culture     Status: None   Collection Time: 11/27/23  3:54 AM   Specimen: Urine, Catheterized  Result Value Ref Range Status   Specimen Description   Final    URINE, CATHETERIZED Performed at [facility], [address], [place], [place] [address]    Special Requests   Final    NONE Performed at [facility], [address], [place], [place] [address]    Culture   Final    NO GROWTH Performed at [facility], [address] [place], [place] [address]    Report Status 11/28/2023 FINAL  Final    Procedures and diagnostic studies:  No results found.              LOS: 3 days   Marcio Hoque  [facility]   Pager on [email or web address]. If 7PM-7AM, please contact night-coverage at [email or web address]     11/30/2023, 3:30 PM

## 2023-11-30 NOTE — Care Management Important Message (Signed)
Important Message  Patient Details  Name: [name] MRN: [id number] Date of Birth: 1930-02-18   Important Message Given:  Yes - Medicare IM     Bernadette Hoit 11/30/2023, 9:48 AM

## 2023-11-30 NOTE — Plan of Care (Signed)
  Problem: Education: Goal: Knowledge of General Education information will improve Description Including pain rating scale, medication(s)/side effects and non-pharmacologic comfort measures Outcome: Progressing   Problem: Health Behavior/Discharge Planning: Goal: Ability to manage health-related needs will improve Outcome: Progressing   

## 2023-12-01 LAB — BASIC METABOLIC PANEL
Anion gap: 9 (ref 5–15)
BUN: 22 mg/dL (ref 8–23)
CO2: 25 mmol/L (ref 22–32)
Calcium: 8.5 mg/dL — ABNORMAL LOW (ref 8.9–10.3)
Chloride: 97 mmol/L — ABNORMAL LOW (ref 98–111)
Creatinine, Ser: 0.84 mg/dL (ref 0.44–1.00)
GFR, Estimated: >60 mL/min (ref >60)
Glucose, Bld: 131 mg/dL — ABNORMAL HIGH (ref 70–99)
Potassium: 3.4 mmol/L — ABNORMAL LOW (ref 3.5–5.1)
Sodium: 131 mmol/L — ABNORMAL LOW (ref 135–145)

## 2023-12-01 MED ORDER — POTASSIUM CHLORIDE 20 MEQ PO PACK
20.0000 meq | PACK | Freq: Once | ORAL | Status: AC
  Administered 2023-12-01: 20 meq via ORAL
  Filled 2023-12-01: qty 1

## 2023-12-01 NOTE — Plan of Care (Signed)
  Problem: Fluid Volume: Goal: Hemodynamic stability will improve Outcome: Progressing   Problem: Clinical Measurements: Goal: Diagnostic test results will improve Outcome: Progressing Goal: Signs and symptoms of infection will decrease Outcome: Progressing   Problem: Respiratory: Goal: Ability to maintain adequate ventilation will improve Outcome: Progressing   Problem: Health Behavior/Discharge Planning: Goal: Ability to manage health-related needs will improve Outcome: Progressing   Problem: Clinical Measurements: Goal: Will remain free from infection Outcome: Progressing Goal: Diagnostic test results will improve Outcome: Progressing Goal: Respiratory complications will improve Outcome: Progressing   Problem: Nutrition: Goal: Adequate nutrition will be maintained Outcome: Progressing   Problem: Pain Management: Goal: General experience of comfort will improve Outcome: Progressing   Problem: Safety: Goal: Ability to remain free from injury will improve Outcome: Progressing

## 2023-12-01 NOTE — Plan of Care (Signed)

## 2023-12-01 NOTE — Progress Notes (Signed)
Progress Note    [name]  VOZ:[id number] DOB: 06-09-1930  DOA: 11/27/2023 PCP: Marguarite Arbour, MD      Brief Narrative:    Medical records reviewed and are as summarized below:  [name] is a 89 y.o. female with medical history significant for hypertension, GERD, peripheral neuropathy, COPD, peripheral vascular disease, dementia, who was brought to the hospital because of general weakness, urinary frequency and urgency of bowel 1 to 2 weeks duration.  She also had nausea and vomiting.  She had been given Macrobid for an outpatient diagnosis of UTI.  Urine culture from 11/21/2023 showed methicillin-resistant Staph epidermidis.  Labs significant for sodium 126, BUN 39, creatinine 1.64, troponin 61, BNP 495.5, lactic acid 2, WBC 18.2   She was admitted to the hospital for severe sepsis secondary to acute UTI, COPD exacerbation, AKI and hyponatremia.   Assessment/Plan:   Principal Problem:   Sepsis due to gram-negative UTI (HCC) Active Problems:   COPD with acute exacerbation (HCC)   Hypothyroidism   GERD without esophagitis   Anxiety   Essential hypertension   Nutrition Problem: Increased nutrient needs Etiology: chronic illness (COPD)  Signs/Symptoms: estimated needs   Body mass index is 29.62 kg/m.   Severe sepsis secondary to acute UTI Resolved: Urine culture from 11/21/2023 showed methicillin-resistant Staph epidermidis.  She is status post 5-day course of vancomycin 11/27/11/20.  She also received 2 doses of IV ceftriaxone which was discontinued on 11/28/2023.  Currently without any symptoms concerning for urinary tract infection.  Persistent leukocytosis: Afebrile. Currently without symptoms concerning for infection.  Stable since yesterday.  Possibly due to steroids.       Latest Ref Rng & Units 11/29/2023    8:46 AM 11/28/2023    4:41 AM 11/27/2023    6:08 AM  CBC  WBC 4.0 - 10.5 K/uL 18.9  18.2  15.9   Hemoglobin 12.0 - 15.0 g/dL 42.5   95.6  38.7   Hematocrit 36.0 - 46.0 % 35.5  34.0  35.4   Platelets 150 - 400 K/uL 209  248  186    Continue to monitor for symptoms and signs of infection.  -Hold on further antibiotics   AKI: Improved.    Latest Ref Rng & Units 12/01/2023   10:42 AM 11/29/2023    7:52 AM 11/28/2023    4:41 AM  BMP  Glucose 70 - 99 mg/dL 564  93  332   BUN 8 - 23 mg/dL 22  36  34   Creatinine 0.44 - 1.00 mg/dL 9.51  8.84  1.66   Sodium 135 - 145 mmol/L 131  130  126   Potassium 3.5 - 5.1 mmol/L 3.4  4.3  4.5   Chloride 98 - 111 mmol/L 97  99  97   CO2 22 - 32 mmol/L 25  20  22    Calcium 8.9 - 10.3 mg/dL 8.5  8.5  8.3      Acute on chronic hyponatremia: Likely secondary to poor solute intake. Sodium level is stable and she is asymptomatic.   COPD exacerbation: Improved.  Continue bronchodilators.  S/p Prednisone   Elevated BNP-495.5: Low suspicion of acute CHF at this time. TTE showed normal EF, moderate mitral stenosis   Comorbidities include hypertension, hypothyroidism, anxiety, GERD    Diet Order             Diet regular Room service appropriate? Yes; Fluid consistency: Thin  Diet effective now  Consultants: None  Procedures: None    Medications:    acidophilus  1 capsule Oral Daily   vitamin C  1,000 mg Oral Daily   calcium-vitamin D  1 tablet Oral BID   cholecalciferol  5,000 Units Oral Daily   [START ON 12/12/2023] cyanocobalamin  1,000 mcg Intramuscular Q30 days   enoxaparin (LOVENOX) injection  30 mg Subcutaneous Q24H   feeding supplement  237 mL Oral BID BM   folic acid  1 mg Oral Daily   guaiFENesin  600 mg Oral BID   levothyroxine  50 mcg Oral Q0600   mometasone-formoterol  2 puff Inhalation BID   multivitamin with minerals  1 tablet Oral Daily   pantoprazole  40 mg Oral Daily   propranolol ER  60 mg Oral Daily   sodium chloride flush  10 mL Intravenous Q12H   Continuous Infusions:  vancomycin 500 mg  (12/01/23 1101)     Anti-infectives (From admission, onward)    Start     Dose/Rate Route Frequency Ordered Stop   11/28/23 0500  vancomycin (VANCOREADY) IVPB 500 mg/100 mL        500 mg 100 mL/hr over 60 Minutes Intravenous Every 24 hours 11/27/23 1649 12/02/23 0959   11/28/23 0000  vancomycin (VANCOREADY) IVPB 500 mg/100 mL  Status:  Discontinued        500 mg 100 mL/hr over 60 Minutes Intravenous  Once 11/27/23 0433 11/27/23 1649   11/27/23 0600  cefTRIAXone (ROCEPHIN) 2 g in sodium chloride 0.9 % 100 mL IVPB  Status:  Discontinued        2 g 200 mL/hr over 30 Minutes Intravenous Every 24 hours 11/27/23 0423 11/28/23 1403   11/27/23 0345  vancomycin (VANCOCIN) 1,750 mg in sodium chloride 0.9 % 500 mL IVPB        1,750 mg 258.8 mL/hr over 120 Minutes Intravenous  Once 11/27/23 0336 11/27/23 0644              Family Communication/Anticipated D/C date and plan/Code Status   DVT prophylaxis: Place and maintain sequential compression device Start: 12/01/23 1132 enoxaparin (LOVENOX) injection 30 mg Start: 11/27/23 1000     Code Status: Full Code  Family Communication: Private caregiver at the bedside Disposition Plan: Plan to discharge to SNF   Status is: Inpatient Remains inpatient appropriate because: Sepsis from UTI   Subjective:   Interval events noted.  Patient denies any chest pain or shortness of breath.  Caregiver is at bedside and supplements history.  Patient denies productive cough, or discomfort with urination.   Objective:    Vitals:   11/30/23 1513 12/01/23 0059 12/01/23 0112 12/01/23 0842  BP: 131/70 (!) 203/87 (!) 180/78 (!) 169/86  Pulse: 64 66 68 69  Resp: 18 16 16 18   Temp: 97.6 F (36.4 C) (!) 97.5 F (36.4 C)  (!) 97.5 F (36.4 C)  TempSrc:      SpO2: 100% 98%  96%  Weight:      Height:       No data found.   Intake/Output Summary (Last 24 hours) at 12/01/2023 1530 Last data filed at 12/01/2023 1300 Gross per 24 hour  Intake  780 ml  Output 400 ml  Net 380 ml   Filed Weights   11/26/23 2208 11/27/23 2300  Weight: 81.6 kg 68.8 kg    Exam: Physical Exam  Constitutional: Well-developed, well-nourished, and in no distress.  Cardiovascular: Normal rate, regular rhythm, No lower extremity edema  Pulmonary: Non labored  breathing on room air, no wheezing or rales  Abdominal: Soft. Normal bowel sounds. Non distended and non tender Neurological: Alert and oriented to person, place, and time. Non focal  Skin: Skin is warm and dry.    Data Reviewed:   I have personally reviewed following labs and imaging studies:  Labs: Labs show the following:   Basic Metabolic Panel: Recent Labs  Lab 11/26/23 2210 11/27/23 0811 11/28/23 0441 11/29/23 0752 12/01/23 1042  NA 126* 131* 126* 130* 131*  K 4.8 4.1 4.5 4.3 3.4*  CL 93* 99 97* 99 97*  CO2 24 22 22  20* 25  GLUCOSE 111* 97 113* 93 131*  BUN 39* 35* 34* 36* 22  CREATININE 1.64* 1.33* 1.11* 1.08* 0.84  CALCIUM 8.9 7.8* 8.3* 8.5* 8.5*   GFR Estimated Creatinine Clearance: 37.7 mL/min (by C-G formula based on SCr of 0.84 mg/dL). Liver Function Tests: No results for input(s): "AST", "ALT", "ALKPHOS", "BILITOT", "PROT", "ALBUMIN" in the last 168 hours. No results for input(s): "LIPASE", "AMYLASE" in the last 168 hours. No results for input(s): "AMMONIA" in the last 168 hours. Coagulation profile Recent Labs  Lab 11/27/23 0608  INR 1.2    CBC: Recent Labs  Lab 11/26/23 2210 11/27/23 0608 11/28/23 0441 11/29/23 0846  WBC 18.2* 15.9* 18.2* 18.9*  NEUTROABS  --   --   --  14.2*  HGB 12.1 11.4* 12.0 12.3  HCT 35.9* 35.4* 34.0* 35.5*  MCV 97.3 102.9* 95.8 96.5  PLT 202 186 248 209   Cardiac Enzymes: No results for input(s): "CKTOTAL", "CKMB", "CKMBINDEX", "TROPONINI" in the last 168 hours. BNP (last 3 results) No results for input(s): "PROBNP" in the last 8760 hours. CBG: No results for input(s): "GLUCAP" in the last 168 hours. D-Dimer: No  results for input(s): "DDIMER" in the last 72 hours. Hgb A1c: No results for input(s): "HGBA1C" in the last 72 hours. Lipid Profile: No results for input(s): "CHOL", "HDL", "LDLCALC", "TRIG", "CHOLHDL", "LDLDIRECT" in the last 72 hours. Thyroid function studies: No results for input(s): "TSH", "T4TOTAL", "T3FREE", "THYROIDAB" in the last 72 hours.  Invalid input(s): "FREET3" Anemia work up: No results for input(s): "VITAMINB12", "FOLATE", "FERRITIN", "TIBC", "IRON", "RETICCTPCT" in the last 72 hours. Sepsis Labs: Recent Labs  Lab 11/26/23 2210 11/27/23 0354 11/27/23 1610 11/27/23 0811 11/28/23 0441 11/29/23 0846  PROCALCITON  --   --   --  2.87  --   --   WBC 18.2*  --  15.9*  --  18.2* 18.9*  LATICACIDVEN  --  2.0* 1.9  --   --   --     Microbiology Recent Results (from the past 240 hours)  Blood culture (routine x 2)     Status: None (Preliminary result)   Collection Time: 11/27/23  3:54 AM   Specimen: BLOOD RIGHT ARM  Result Value Ref Range Status   Specimen Description BLOOD RIGHT ARM  Final   Special Requests   Final    BOTTLES DRAWN AEROBIC AND ANAEROBIC Blood Culture results may not be optimal due to an inadequate volume of blood received in culture bottles   Culture   Final    NO GROWTH 4 DAYS Performed at [facility], [address] [place], [place] [address]    Report Status PENDING  Incomplete  Blood culture (routine x 2)     Status: None (Preliminary result)   Collection Time: 11/27/23  3:54 AM   Specimen: BLOOD LEFT ARM  Result Value Ref Range Status  Specimen Description BLOOD LEFT ARM  Final   Special Requests   Final    BOTTLES DRAWN AEROBIC AND ANAEROBIC Blood Culture results may not be optimal due to an inadequate volume of blood received in culture bottles   Culture   Final    NO GROWTH 4 DAYS Performed at [facility], [address] [place], [place] [address]    Report Status PENDING  Incomplete  Urine Culture     Status:  None   Collection Time: 11/27/23  3:54 AM   Specimen: Urine, Catheterized  Result Value Ref Range Status   Specimen Description   Final    URINE, CATHETERIZED Performed at [facility], [address] [place], [place] [address]    Special Requests   Final    NONE Performed at [facility], [address] [place], [place] [address]    Culture   Final    NO GROWTH Performed at [facility], [address] [place], [place] [address]    Report Status 11/28/2023 FINAL  Final    Procedures and diagnostic studies:  No results found.              LOS: 4 days   Marolyn Haller MD [facility]   Pager on [email or web address]. If 7PM-7AM, please contact night-coverage at [email or web address]     12/01/2023, 3:30 PM

## 2023-12-01 NOTE — Progress Notes (Signed)
Occupational Therapy Treatment Patient Details Name: [name] MRN: [id number] DOB: Apr 16, 1930 Today's Date: 12/01/2023   History of present illness Pt is a 89 y.o. Caucasian female with medical history significant for essential hypertension, GERD, peripheral neuropathy, COPD and and peripheral vascular disease, who presented to the emergency room with acute onset of generalized weakness with recent urinary frequency and urgency. MD assessment includes sepsis due to gram-negative UTI and COPD with acute exacerbation.   OT comments  Pt seen for OT tx this date. Pt received up in the recliner with nursing and expresses desire to return to bed. Pt required MOD A +2 for physical assist to stand and pivot to the EOB from recliner with heavy posterior lean noted and unable to correct despite heavy VC and TC. MAX A +2 for return to supine and MAX A in side lying for pericare. Pt endorsed BLE tenderness. Pt able to follow simple commands this date with cues. Continues to require heavy assist for all aspects of mobility and ADL. Continues to benefit from skilled OT services.       If plan is discharge home, recommend the following:  Two people to help with walking and/or transfers;A lot of help with bathing/dressing/bathroom;Supervision due to cognitive status   Equipment Recommendations  Other (comment) (defer)    Recommendations for Other Services      Precautions / Restrictions Precautions Precautions: Fall Restrictions Weight Bearing Restrictions Per Provider Order: No       Mobility Bed Mobility Overal bed mobility: Needs Assistance Bed Mobility: Rolling, Sit to Supine Rolling: Min assist, Used rails     Sit to supine: Max assist, +2 for physical assistance        Transfers Overall transfer level: Needs assistance Equipment used: Rolling walker (2 wheels) Transfers: Sit to/from Stand Sit to Stand: Mod assist, +2 physical assistance           General transfer  comment: VC for foot/hand placement     Balance Overall balance assessment: Needs assistance Sitting-balance support: Bilateral upper extremity supported, Feet supported, Feet unsupported Sitting balance-Leahy Scale: Fair   Postural control: Posterior lean Standing balance support: Bilateral upper extremity supported, During functional activity, Reliant on assistive device for balance Standing balance-Leahy Scale: Zero Standing balance comment: heavy posterior lean requiring heavy +2 assist to maintain upright during pivot back to  bed                           ADL either performed or assessed with clinical judgement   ADL Overall ADL's : Needs assistance/impaired                                       General ADL Comments: MAX A for pericare in sidelying once returned to bed.    Extremity/Trunk Assessment              Vision       Perception     Praxis      Cognition Arousal: Alert Behavior During Therapy: Anxious Overall Cognitive Status: No family/caregiver present to determine baseline cognitive functioning                                 General Comments: Pt initially appears anxious, perseverating on the fact that the person who was sitting with  her to keep her company had left and she was alone, requesting that her son be notified so he could come sit with her. RN notified. Able to follow simple commands well.        Exercises      Shoulder Instructions       General Comments      Pertinent Vitals/ Pain       Pain Assessment Pain Assessment: Faces Faces Pain Scale: Hurts even more Pain Location: LLE tender Pain Descriptors / Indicators: Aching, Grimacing, Guarding Pain Intervention(s): Monitored during session, Premedicated before session, Limited activity within patient's tolerance, Repositioned  Home Living                                          Prior Functioning/Environment               Frequency  Min 1X/week        Progress Toward Goals  OT Goals(current goals can now be found in the care plan section)  Progress towards OT goals: Progressing toward goals  Acute Rehab OT Goals Patient Stated Goal: go home OT Goal Formulation: With patient Time For Goal Achievement: 12/11/23 Potential to Achieve Goals: Fair  Plan      Co-evaluation                 AM-PAC OT "6 Clicks" Daily Activity     Outcome Measure   Help from another person eating meals?: None Help from another person taking care of personal grooming?: A Little Help from another person toileting, which includes using toliet, bedpan, or urinal?: A Lot Help from another person bathing (including washing, rinsing, drying)?: A Lot Help from another person to put on and taking off regular upper body clothing?: A Lot Help from another person to put on and taking off regular lower body clothing?: A Lot 6 Click Score: 15    End of Session Equipment Utilized During Treatment: Rolling walker (2 wheels)  OT Visit Diagnosis: Other abnormalities of gait and mobility (R26.89);Muscle weakness (generalized) (M62.81)   Activity Tolerance Patient tolerated treatment well   Patient Left in bed;with call bell/phone within reach;with bed alarm set   Nurse Communication Other (comment) (pt requesting her son be contacted)        Time: 4010-2725 OT Time Calculation (min): 28 min  Charges: OT General Charges $OT Visit: 1 Visit OT Treatments $Self Care/Home Management : 23-37 mins  Arman Filter., MPH, MS, OTR/L ascom [phone] 12/01/23, 3:04 PM

## 2023-12-02 LAB — CULTURE, BLOOD (ROUTINE X 2)
Culture: NO GROWTH
Culture: NO GROWTH

## 2023-12-02 LAB — BASIC METABOLIC PANEL
Anion gap: 7 (ref 5–15)
BUN: 17 mg/dL (ref 8–23)
CO2: 26 mmol/L (ref 22–32)
Calcium: 8.5 mg/dL — ABNORMAL LOW (ref 8.9–10.3)
Chloride: 99 mmol/L (ref 98–111)
Creatinine, Ser: 0.61 mg/dL (ref 0.44–1.00)
GFR, Estimated: >60 mL/min (ref >60)
Glucose, Bld: 93 mg/dL (ref 70–99)
Potassium: 3.8 mmol/L (ref 3.5–5.1)
Sodium: 132 mmol/L — ABNORMAL LOW (ref 135–145)

## 2023-12-02 MED ORDER — GERHARDT'S BUTT CREAM
TOPICAL_CREAM | Freq: Every day | CUTANEOUS | Status: DC
  Administered 2023-12-04: 1 via TOPICAL
  Filled 2023-12-02 (×2): qty 60

## 2023-12-02 MED ORDER — ENOXAPARIN SODIUM 40 MG/0.4ML IJ SOSY
40.0000 mg | PREFILLED_SYRINGE | INTRAMUSCULAR | Status: DC
  Administered 2023-12-03 – 2023-12-05 (×3): 40 mg via SUBCUTANEOUS
  Filled 2023-12-02 (×3): qty 0.4

## 2023-12-02 NOTE — TOC Progression Note (Addendum)
Transition of Care [facility]) - Progression Note    Patient Details  Name: [name] MRN: [id number] Date of Birth: 03-04-1930  Transition [facility]) CM/SW Contact  Bing Quarry, RN Phone Number: 12/02/2023, 2:50 PM  Clinical Narrative:  12/21: Spoke with patient's son, ERIC, who states he has POA and HCPOA but is not a legal guardian per se. He will email the forms to be put into the permament record. Discussed bed offers but son stated that he now has spoken to him about a list, choices, or his preferences. Patient has been a [facility] in [place] in the past and that would be first preference. Requested that SNF/STR choice list from Medicare.gov be sent via email along with link to Medicare.gov website for his to review local [place] area SNF's which is preference after [facility] in [place], [place].   Informed [facility] has offered a bed. Will follow up with other preference and choices of local options that did offer a bed.   Gabriel Cirri MSN RN CM  Care Management Department.  [place]  [facility] Direct Dial: [phone] Main Office Phone: [phone] Weekends Only      Expected Discharge Plan: Skilled Nursing Facility Barriers to Discharge: SNF Pending bed offer  Expected Discharge Plan and Services   Discharge Planning Services: CM Consult   Living arrangements for the past 2 months: Single Family Home                 DME Arranged: N/A DME Agency: NA       HH Arranged: NA           Social Determinants of Health (SDOH) Interventions SDOH Screenings   Food Insecurity: Patient Unable To Answer (11/27/2023)  Housing: Patient Unable To Answer (11/27/2023)  Transportation Needs: Patient Unable To Answer (11/27/2023)  Utilities: Patient Unable To Answer (11/27/2023)  Tobacco Use: Low Risk  (11/26/2023)    Readmission Risk Interventions    07/25/2021   10:49 AM  Readmission Risk Prevention Plan  Transportation Screening Complete  PCP or Specialist Appt  within 5-7 Days Complete  Home Care Screening Complete  Medication Review (RN CM) Complete

## 2023-12-02 NOTE — Progress Notes (Signed)
Progress Note    [name]  ZOX:[id number] DOB: October 15, 1930  DOA: 11/27/2023 PCP: Marguarite Arbour, MD      Brief Narrative:    Medical records reviewed and are as summarized below:  [name] is a 89 y.o. female with medical history significant for hypertension, GERD, peripheral neuropathy, COPD, peripheral vascular disease, dementia, who was brought to the hospital because of general weakness, urinary frequency and urgency of bowel 1 to 2 weeks duration.  She also had nausea and vomiting.  She had been given Macrobid for an outpatient diagnosis of UTI.  Urine culture from 11/21/2023 showed methicillin-resistant Staph epidermidis.  Labs significant for sodium 126, BUN 39, creatinine 1.64, troponin 61, BNP 495.5, lactic acid 2, WBC 18.2   She was admitted to the hospital for severe sepsis secondary to acute UTI, COPD exacerbation, AKI and hyponatremia.  She is medically stable for discharge   Assessment/Plan:   Principal Problem:   Sepsis due to gram-negative UTI (HCC) Active Problems:   COPD with acute exacerbation (HCC)   Hypothyroidism   GERD without esophagitis   Anxiety   Essential hypertension   Nutrition Problem: Increased nutrient needs Etiology: chronic illness (COPD)  Signs/Symptoms: estimated needs   Body mass index is 29.62 kg/m.   Severe sepsis secondary to acute UTI Resolved: Urine culture from 11/21/2023 showed methicillin-resistant Staph epidermidis.  She is status post 5-day course of vancomycin 11/27/11/20.  She also received 2 doses of IV ceftriaxone which was discontinued on 11/28/2023.  Currently without any symptoms concerning for urinary tract infection.  Persistent leukocytosis: Afebrile. Currently without symptoms concerning for infection.  Stable since yesterday.  Possibly due to steroids.       Latest Ref Rng & Units 11/29/2023    8:46 AM 11/28/2023    4:41 AM 11/27/2023    6:08 AM  CBC  WBC 4.0 - 10.5 K/uL 18.9  18.2   15.9   Hemoglobin 12.0 - 15.0 g/dL 81.1  91.4  78.2   Hematocrit 36.0 - 46.0 % 35.5  34.0  35.4   Platelets 150 - 400 K/uL 209  248  186    Continue to monitor for symptoms and signs of infection.  -Possibly due to steroids  -Hold on further antibiotics  -CBC in the AM    AKI: REsolved.    Latest Ref Rng & Units 12/02/2023    8:18 AM 12/01/2023   10:42 AM 11/29/2023    7:52 AM  BMP  Glucose 70 - 99 mg/dL 93  956  93   BUN 8 - 23 mg/dL 17  22  36   Creatinine 0.44 - 1.00 mg/dL 2.13  0.86  5.78   Sodium 135 - 145 mmol/L 132  131  130   Potassium 3.5 - 5.1 mmol/L 3.8  3.4  4.3   Chloride 98 - 111 mmol/L 99  97  99   CO2 22 - 32 mmol/L 26  25  20    Calcium 8.9 - 10.3 mg/dL 8.5  8.5  8.5      Acute on chronic hyponatremia: Likely secondary to poor solute intake. Sodium level is stable and she is asymptomatic.    Latest Ref Rng & Units 12/02/2023    8:18 AM 12/01/2023   10:42 AM 11/29/2023    7:52 AM  BMP  Glucose 70 - 99 mg/dL 93  469  93   BUN 8 - 23 mg/dL 17  22  36   Creatinine  0.44 - 1.00 mg/dL 9.51  8.84  1.66   Sodium 135 - 145 mmol/L 132  131  130   Potassium 3.5 - 5.1 mmol/L 3.8  3.4  4.3   Chloride 98 - 111 mmol/L 99  97  99   CO2 22 - 32 mmol/L 26  25  20    Calcium 8.9 - 10.3 mg/dL 8.5  8.5  8.5       COPD exacerbation: Resolved. No dyspnea, cough/sputum production.  Continue bronchodilators.  S/p Prednisone   Elevated BNP-495.5: Low suspicion of acute CHF at this time. TTE showed normal EF, moderate mitral stenosis   Comorbidities include hypertension, hypothyroidism, anxiety, GERD    Diet Order             Diet regular Room service appropriate? Yes; Fluid consistency: Thin  Diet effective now                            Consultants: None  Procedures: None    Medications:    acidophilus  1 capsule Oral Daily   vitamin C  1,000 mg Oral Daily   calcium-vitamin D  1 tablet Oral BID   cholecalciferol  5,000 Units Oral  Daily   [START ON 12/12/2023] cyanocobalamin  1,000 mcg Intramuscular Q30 days   [START ON 12/03/2023] enoxaparin (LOVENOX) injection  40 mg Subcutaneous Q24H   feeding supplement  237 mL Oral BID BM   folic acid  1 mg Oral Daily   Gerhardt's butt cream   Topical Daily   guaiFENesin  600 mg Oral BID   levothyroxine  50 mcg Oral Q0600   mometasone-formoterol  2 puff Inhalation BID   multivitamin with minerals  1 tablet Oral Daily   pantoprazole  40 mg Oral Daily   propranolol ER  60 mg Oral Daily   sodium chloride flush  10 mL Intravenous Q12H   Continuous Infusions:     Anti-infectives (From admission, onward)    Start     Dose/Rate Route Frequency Ordered Stop   11/28/23 0500  vancomycin (VANCOREADY) IVPB 500 mg/100 mL        500 mg 100 mL/hr over 60 Minutes Intravenous Every 24 hours 11/27/23 1649 12/02/23 0959   11/28/23 0000  vancomycin (VANCOREADY) IVPB 500 mg/100 mL  Status:  Discontinued        500 mg 100 mL/hr over 60 Minutes Intravenous  Once 11/27/23 0433 11/27/23 1649   11/27/23 0600  cefTRIAXone (ROCEPHIN) 2 g in sodium chloride 0.9 % 100 mL IVPB  Status:  Discontinued        2 g 200 mL/hr over 30 Minutes Intravenous Every 24 hours 11/27/23 0423 11/28/23 1403   11/27/23 0345  vancomycin (VANCOCIN) 1,750 mg in sodium chloride 0.9 % 500 mL IVPB        1,750 mg 258.8 mL/hr over 120 Minutes Intravenous  Once 11/27/23 0336 11/27/23 0644              Family Communication/Anticipated D/C date and plan/Code Status   DVT prophylaxis: enoxaparin (LOVENOX) injection 40 mg Start: 12/03/23 1000 Place and maintain sequential compression device Start: 12/01/23 1132     Code Status: Full Code  Family Communication: Private caregiver at the bedside Disposition Plan: Plan to discharge to SNF   Status is: Inpatient Remains inpatient appropriate because: Sepsis from UTI   Subjective:   Patient denies any chest pain or shortness of breath.  She is eager  to go  home. Objective:    Vitals:   12/01/23 2335 12/02/23 0538 12/02/23 0857 12/02/23 1535  BP: (!) 183/71 (!) 166/78 (!) 153/74 (!) 143/64  Pulse: 74 71 71 75  Resp: 18 19 18 18   Temp: 98.5 F (36.9 C) 98.3 F (36.8 C) 98.2 F (36.8 C) 97.6 F (36.4 C)  TempSrc: Oral Oral    SpO2: 96% 96% 96% 99%  Weight:      Height:       No data found.   Intake/Output Summary (Last 24 hours) at 12/02/2023 1801 Last data filed at 12/02/2023 1700 Gross per 24 hour  Intake 850 ml  Output 1000 ml  Net -150 ml   Filed Weights   11/26/23 2208 11/27/23 2300  Weight: 81.6 kg 68.8 kg    Exam: Constitutional: In no distress.  Cardiovascular: Normal rate, regular rhythm. No lower extremity edema  Pulmonary: Non labored breathing on room air, no wheezing or rales.  NAbdominal: Soft. Normal bowel sounds. Non distended and non tender Musculoskeletal: Normal range of motion.     Neurological: Alert and oriented to person, place, and time. Non focal  Skin: Skin is warm and dry.   Data Reviewed:   I have personally reviewed following labs and imaging studies:  Labs: Labs show the following:   Basic Metabolic Panel: Recent Labs  Lab 11/27/23 0811 11/28/23 0441 11/29/23 0752 12/01/23 1042 12/02/23 0818  NA 131* 126* 130* 131* 132*  K 4.1 4.5 4.3 3.4* 3.8  CL 99 97* 99 97* 99  CO2 22 22 20* 25 26  GLUCOSE 97 113* 93 131* 93  BUN 35* 34* 36* 22 17  CREATININE 1.33* 1.11* 1.08* 0.84 0.61  CALCIUM 7.8* 8.3* 8.5* 8.5* 8.5*   GFR Estimated Creatinine Clearance: 39.6 mL/min (by C-G formula based on SCr of 0.61 mg/dL). Liver Function Tests: No results for input(s): "AST", "ALT", "ALKPHOS", "BILITOT", "PROT", "ALBUMIN" in the last 168 hours. No results for input(s): "LIPASE", "AMYLASE" in the last 168 hours. No results for input(s): "AMMONIA" in the last 168 hours. Coagulation profile Recent Labs  Lab 11/27/23 0608  INR 1.2    CBC: Recent Labs  Lab 11/26/23 2210 11/27/23 0608  11/28/23 0441 11/29/23 0846  WBC 18.2* 15.9* 18.2* 18.9*  NEUTROABS  --   --   --  14.2*  HGB 12.1 11.4* 12.0 12.3  HCT 35.9* 35.4* 34.0* 35.5*  MCV 97.3 102.9* 95.8 96.5  PLT 202 186 248 209   Cardiac Enzymes: No results for input(s): "CKTOTAL", "CKMB", "CKMBINDEX", "TROPONINI" in the last 168 hours. BNP (last 3 results) No results for input(s): "PROBNP" in the last 8760 hours. CBG: No results for input(s): "GLUCAP" in the last 168 hours. D-Dimer: No results for input(s): "DDIMER" in the last 72 hours. Hgb A1c: No results for input(s): "HGBA1C" in the last 72 hours. Lipid Profile: No results for input(s): "CHOL", "HDL", "LDLCALC", "TRIG", "CHOLHDL", "LDLDIRECT" in the last 72 hours. Thyroid function studies: No results for input(s): "TSH", "T4TOTAL", "T3FREE", "THYROIDAB" in the last 72 hours.  Invalid input(s): "FREET3" Anemia work up: No results for input(s): "VITAMINB12", "FOLATE", "FERRITIN", "TIBC", "IRON", "RETICCTPCT" in the last 72 hours. Sepsis Labs: Recent Labs  Lab 11/26/23 2210 11/27/23 0354 11/27/23 0608 11/27/23 0811 11/28/23 0441 11/29/23 0846  PROCALCITON  --   --   --  2.87  --   --   WBC 18.2*  --  15.9*  --  18.2* 18.9*  LATICACIDVEN  --  2.0*  1.9  --   --   --     Microbiology Recent Results (from the past 240 hours)  Blood culture (routine x 2)     Status: None   Collection Time: 11/27/23  3:54 AM   Specimen: BLOOD RIGHT ARM  Result Value Ref Range Status   Specimen Description BLOOD RIGHT ARM  Final   Special Requests   Final    BOTTLES DRAWN AEROBIC AND ANAEROBIC Blood Culture results may not be optimal due to an inadequate volume of blood received in culture bottles   Culture   Final    NO GROWTH 5 DAYS Performed at [facility], [address], [place], [place] [address]    Report Status 12/02/2023 FINAL  Final  Blood culture (routine x 2)     Status: None   Collection Time: 11/27/23  3:54 AM   Specimen: BLOOD LEFT ARM   Result Value Ref Range Status   Specimen Description BLOOD LEFT ARM  Final   Special Requests   Final    BOTTLES DRAWN AEROBIC AND ANAEROBIC Blood Culture results may not be optimal due to an inadequate volume of blood received in culture bottles   Culture   Final    NO GROWTH 5 DAYS Performed at [facility], [address] [place], [place] [address]    Report Status 12/02/2023 FINAL  Final  Urine Culture     Status: None   Collection Time: 11/27/23  3:54 AM   Specimen: Urine, Catheterized  Result Value Ref Range Status   Specimen Description   Final    URINE, CATHETERIZED Performed at [facility], [address] [place], [place] [address]    Special Requests   Final    NONE Performed at [facility], [address] [place], [place] [address]    Culture   Final    NO GROWTH Performed at [facility], [address] [place], [place] [address]    Report Status 11/28/2023 FINAL  Final    Procedures and diagnostic studies:  No results found.              LOS: 5 days   Marolyn Haller MD [facility]   Pager on [email or web address]. If 7PM-7AM, please contact night-coverage at [email or web address]     12/02/2023, 6:01 PM

## 2023-12-03 LAB — CBC
HCT: 35.5 % — ABNORMAL LOW (ref 36.0–46.0)
Hemoglobin: 12.6 g/dL (ref 12.0–15.0)
MCH: 32.8 pg (ref 26.0–34.0)
MCHC: 35.5 g/dL (ref 30.0–36.0)
MCV: 92.4 fL (ref 80.0–100.0)
Platelets: 224 10*3/uL (ref 150–400)
RBC: 3.84 MIL/uL — ABNORMAL LOW (ref 3.87–5.11)
RDW: 12.1 % (ref 11.5–15.5)
WBC: 19.9 10*3/uL — ABNORMAL HIGH (ref 4.0–10.5)
nRBC: 0 % (ref 0.0–0.2)

## 2023-12-03 NOTE — Plan of Care (Signed)
  Problem: Fluid Volume: Goal: Hemodynamic stability will improve Outcome: Progressing   Problem: Clinical Measurements: Goal: Diagnostic test results will improve Outcome: Progressing Goal: Signs and symptoms of infection will decrease Outcome: Progressing   Problem: Respiratory: Goal: Ability to maintain adequate ventilation will improve Outcome: Progressing   Problem: Education: Goal: Knowledge of General Education information will improve Description: Including pain rating scale, medication(s)/side effects and non-pharmacologic comfort measures Outcome: Progressing   Problem: Health Behavior/Discharge Planning: Goal: Ability to manage health-related needs will improve Outcome: Progressing   Problem: Clinical Measurements: Goal: Ability to maintain clinical measurements within normal limits will improve Outcome: Progressing Goal: Will remain free from infection Outcome: Progressing Goal: Diagnostic test results will improve Outcome: Progressing Goal: Respiratory complications will improve Outcome: Progressing Goal: Cardiovascular complication will be avoided Outcome: Progressing   Problem: Activity: Goal: Risk for activity intolerance will decrease Outcome: Progressing   Problem: Nutrition: Goal: Adequate nutrition will be maintained Outcome: Progressing   Problem: Coping: Goal: Level of anxiety will decrease Outcome: Progressing   Problem: Elimination: Goal: Will not experience complications related to bowel motility Outcome: Progressing Goal: Will not experience complications related to urinary retention Outcome: Progressing   Problem: Pain Management: Goal: General experience of comfort will improve Outcome: Progressing   Problem: Safety: Goal: Ability to remain free from injury will improve Outcome: Progressing   Problem: Skin Integrity: Goal: Risk for impaired skin integrity will decrease Outcome: Progressing

## 2023-12-03 NOTE — TOC Progression Note (Signed)
Transition of Care [facility]) - Progression Note    Patient Details  Name: [name] MRN: [id number] Date of Birth: 1930-08-10  Transition of Care [facility]) CM/SW Contact  Bing Quarry, RN Phone Number: 12/03/2023, 2:50 PM  Clinical Narrative:   12/22: Merlene Laughter, returned call from his office while looking at the [email or web address].gov website. After consideration and review of Medicare.gov choice list website given to him yesterday, his choices are [facility], [employer], [employer] [facility] [employer], [place] in that order.   [facility] has accepted of the earlier search done before son was given choice list to review. [facility], [employer], and [facility] remain pending as of 250 pm today.   Still need HCPOA documents as patient is oriented to person only yesterday and today. Son, Minerva Areola, states he is HCPOA.   TOC to continue to follow through disposition.   Gabriel Cirri MSN RN CM  Care Management Department.  [place]  [facility] Direct Dial: [phone] Main Office Phone: [phone] Weekends Only      Expected Discharge Plan: Skilled Nursing Facility Barriers to Discharge: SNF Pending bed offer  Expected Discharge Plan and Services   Discharge Planning Services: CM Consult   Living arrangements for the past 2 months: Single Family Home                 DME Arranged: N/A DME Agency: NA       HH Arranged: NA           Social Determinants of Health (SDOH) Interventions SDOH Screenings   Food Insecurity: Patient Unable To Answer (11/27/2023)  Housing: Patient Unable To Answer (11/27/2023)  Transportation Needs: Patient Unable To Answer (11/27/2023)  Utilities: Patient Unable To Answer (11/27/2023)  Tobacco Use: Low Risk  (11/26/2023)    Readmission Risk Interventions    07/25/2021   10:49 AM  Readmission Risk Prevention Plan  Transportation Screening Complete  PCP or Specialist Appt within 5-7 Days Complete  Home Care Screening Complete  Medication  Review (RN CM) Complete

## 2023-12-03 NOTE — Progress Notes (Addendum)
Progress Note    [name]  ZOX:[id number] DOB: [birth year]  DOA: 11/27/2023 PCP: Marguarite Arbour, MD      Brief Narrative:    Medical records reviewed and are as summarized below:  [name] is a 89 y.o. female with medical history significant for hypertension, GERD, peripheral neuropathy, COPD, peripheral vascular disease, dementia, who was brought to the hospital because of general weakness, urinary frequency and urgency of bowel 1 to 2 weeks duration.  She also had nausea and vomiting.  She had been given Macrobid for an outpatient diagnosis of UTI.  Urine culture from 11/21/2023 showed methicillin-resistant Staph epidermidis.  Labs significant for sodium 126, BUN 39, creatinine 1.64, troponin 61, BNP 495.5, lactic acid 2, WBC 18.2   She was admitted to the hospital for severe sepsis secondary to acute UTI, COPD exacerbation, AKI and hyponatremia.  She is medically stable for discharge   Assessment/Plan:   Principal Problem:   Sepsis due to gram-negative UTI (HCC) Active Problems:   COPD with acute exacerbation (HCC)   Hypothyroidism   GERD without esophagitis   Anxiety   Essential hypertension   Nutrition Problem: Increased nutrient needs Etiology: chronic illness (COPD)  Signs/Symptoms: estimated needs   Body mass index is 29.62 kg/m.     Severe sepsis secondary to acute UTI Resolved: Urine culture from 11/21/2023 showed methicillin-resistant Staph epidermidis.  She is status post 5-day course of vancomycin 11/27/11/20.  She also received 2 doses of IV ceftriaxone which was discontinued on 11/28/2023.  Currently without any symptoms concerning for urinary tract infection.  Persistent leukocytosis: Afebrile. Currently without symptoms concerning for infection.  Stable since yesterday.  Possibly due to steroids.       Latest Ref Rng & Units 12/03/2023    8:08 AM 11/29/2023    8:46 AM 11/28/2023    4:41 AM  CBC  WBC 4.0 - 10.5 K/uL 19.9   18.9  18.2   Hemoglobin 12.0 - 15.0 g/dL 81.1  91.4  78.2   Hematocrit 36.0 - 46.0 % 35.5  35.5  34.0   Platelets 150 - 400 K/uL 224  209  248    Continue to monitor for symptoms and signs of infection.  -Possibly due to steroids  -Hold on further antibiotics    AKI: Likely due to decreased Effective arterial blood volume in the setting of her sepsis.  REsolved.    Latest Ref Rng & Units 12/02/2023    8:18 AM 12/01/2023   10:42 AM 11/29/2023    7:52 AM  BMP  Glucose 70 - 99 mg/dL 93  956  93   BUN 8 - 23 mg/dL 17  22  36   Creatinine 0.44 - 1.00 mg/dL 2.13  0.86  5.78   Sodium 135 - 145 mmol/L 132  131  130   Potassium 3.5 - 5.1 mmol/L 3.8  3.4  4.3   Chloride 98 - 111 mmol/L 99  97  99   CO2 22 - 32 mmol/L 26  25  20    Calcium 8.9 - 10.3 mg/dL 8.5  8.5  8.5      Acute on chronic hyponatremia: Likely secondary to poor solute intake. Sodium level is stable and she is asymptomatic.    Latest Ref Rng & Units 12/02/2023    8:18 AM 12/01/2023   10:42 AM 11/29/2023    7:52 AM  BMP  Glucose 70 - 99 mg/dL 93  469  93   BUN  8 - 23 mg/dL 17  22  36   Creatinine 0.44 - 1.00 mg/dL 1.47  8.29  5.62   Sodium 135 - 145 mmol/L 132  131  130   Potassium 3.5 - 5.1 mmol/L 3.8  3.4  4.3   Chloride 98 - 111 mmol/L 99  97  99   CO2 22 - 32 mmol/L 26  25  20    Calcium 8.9 - 10.3 mg/dL 8.5  8.5  8.5       COPD exacerbation: Resolved. No dyspnea, cough/sputum production.  Continue bronchodilators.  S/p Prednisone   Elevated BNP-495.5 on admission.  Low suspicion of acute CHF at this time. TTE showed normal EF, moderate mitral stenosis Euvolemic    Comorbidities include hypertension, hypothyroidism, anxiety, GERD    Diet Order             Diet regular Room service appropriate? Yes; Fluid consistency: Thin  Diet effective now                   Consultants: None  Procedures: None    Medications:    acidophilus  1 capsule Oral Daily   vitamin C  1,000 mg Oral  Daily   calcium-vitamin D  1 tablet Oral BID   cholecalciferol  5,000 Units Oral Daily   [START ON 12/12/2023] cyanocobalamin  1,000 mcg Intramuscular Q30 days   enoxaparin (LOVENOX) injection  40 mg Subcutaneous Q24H   feeding supplement  237 mL Oral BID BM   folic acid  1 mg Oral Daily   Gerhardt's butt cream   Topical Daily   guaiFENesin  600 mg Oral BID   levothyroxine  50 mcg Oral Q0600   mometasone-formoterol  2 puff Inhalation BID   multivitamin with minerals  1 tablet Oral Daily   pantoprazole  40 mg Oral Daily   propranolol ER  60 mg Oral Daily   sodium chloride flush  10 mL Intravenous Q12H   Continuous Infusions:     Anti-infectives (From admission, onward)    Start     Dose/Rate Route Frequency Ordered Stop   11/28/23 0500  vancomycin (VANCOREADY) IVPB 500 mg/100 mL        500 mg 100 mL/hr over 60 Minutes Intravenous Every 24 hours 11/27/23 1649 12/02/23 0959   11/28/23 0000  vancomycin (VANCOREADY) IVPB 500 mg/100 mL  Status:  Discontinued        500 mg 100 mL/hr over 60 Minutes Intravenous  Once 11/27/23 0433 11/27/23 1649   11/27/23 0600  cefTRIAXone (ROCEPHIN) 2 g in sodium chloride 0.9 % 100 mL IVPB  Status:  Discontinued        2 g 200 mL/hr over 30 Minutes Intravenous Every 24 hours 11/27/23 0423 11/28/23 1403   11/27/23 0345  vancomycin (VANCOCIN) 1,750 mg in sodium chloride 0.9 % 500 mL IVPB        1,750 mg 258.8 mL/hr over 120 Minutes Intravenous  Once 11/27/23 0336 11/27/23 0644              Family Communication/Anticipated D/C date and plan/Code Status   DVT prophylaxis: enoxaparin (LOVENOX) injection 40 mg Start: 12/03/23 1000 Place and maintain sequential compression device Start: 12/01/23 1132     Code Status: Full Code  Family Communication: Private caregiver at the bedside Disposition Plan: Plan to discharge to SNF   Status is: Inpatient Remains inpatient appropriate because: awaiting safe discharge to SNF.    Subjective:    No SOB or chest  pain. No dysuria. Discussing football game.  Objective:    Vitals:   12/02/23 1535 12/02/23 2033 12/03/23 0809 12/03/23 1558  BP: (!) 143/64 (!) 153/54 (!) 187/74 139/64  Pulse: 75 69 79 67  Resp: 18 18 18 16   Temp: 97.6 F (36.4 C) (!) 97.5 F (36.4 C) 97.6 F (36.4 C) 97.6 F (36.4 C)  TempSrc:  Oral Oral   SpO2: 99% 97% 98% 99%  Weight:      Height:       No data found.   Intake/Output Summary (Last 24 hours) at 12/03/2023 1723 Last data filed at 12/03/2023 1300 Gross per 24 hour  Intake --  Output 375 ml  Net -375 ml   Filed Weights   11/26/23 2208 11/27/23 2300  Weight: 81.6 kg 68.8 kg    Exam:  Constitutional: In no distress.  Cardiovascular: Normal rate, regular rhythm. No lower extremity edema  Pulmonary: Non labored breathing on room air, no wheezing or rales.  Abdominal: Soft. Normal bowel sounds. Non distended and non tender Musculoskeletal: Normal range of motion.     Neurological: Alert and oriented to person, place, and time. Non focal.  Skin: Skin is warm and dry.    Data Reviewed:   I have personally reviewed following labs and imaging studies:  Labs: Labs show the following:   Basic Metabolic Panel: Recent Labs  Lab 11/27/23 0811 11/28/23 0441 11/29/23 0752 12/01/23 1042 12/02/23 0818  NA 131* 126* 130* 131* 132*  K 4.1 4.5 4.3 3.4* 3.8  CL 99 97* 99 97* 99  CO2 22 22 20* 25 26  GLUCOSE 97 113* 93 131* 93  BUN 35* 34* 36* 22 17  CREATININE 1.33* 1.11* 1.08* 0.84 0.61  CALCIUM 7.8* 8.3* 8.5* 8.5* 8.5*   GFR Estimated Creatinine Clearance: 39.6 mL/min (by C-G formula based on SCr of 0.61 mg/dL). Liver Function Tests: No results for input(s): "AST", "ALT", "ALKPHOS", "BILITOT", "PROT", "ALBUMIN" in the last 168 hours. No results for input(s): "LIPASE", "AMYLASE" in the last 168 hours. No results for input(s): "AMMONIA" in the last 168 hours. Coagulation profile Recent Labs  Lab 11/27/23 0608  INR 1.2     CBC: Recent Labs  Lab 11/26/23 2210 11/27/23 0608 11/28/23 0441 11/29/23 0846 12/03/23 0808  WBC 18.2* 15.9* 18.2* 18.9* 19.9*  NEUTROABS  --   --   --  14.2*  --   HGB 12.1 11.4* 12.0 12.3 12.6  HCT 35.9* 35.4* 34.0* 35.5* 35.5*  MCV 97.3 102.9* 95.8 96.5 92.4  PLT 202 186 248 209 224   Cardiac Enzymes: No results for input(s): "CKTOTAL", "CKMB", "CKMBINDEX", "TROPONINI" in the last 168 hours. BNP (last 3 results) No results for input(s): "PROBNP" in the last 8760 hours. CBG: No results for input(s): "GLUCAP" in the last 168 hours. D-Dimer: No results for input(s): "DDIMER" in the last 72 hours. Hgb A1c: No results for input(s): "HGBA1C" in the last 72 hours. Lipid Profile: No results for input(s): "CHOL", "HDL", "LDLCALC", "TRIG", "CHOLHDL", "LDLDIRECT" in the last 72 hours. Thyroid function studies: No results for input(s): "TSH", "T4TOTAL", "T3FREE", "THYROIDAB" in the last 72 hours.  Invalid input(s): "FREET3" Anemia work up: No results for input(s): "VITAMINB12", "FOLATE", "FERRITIN", "TIBC", "IRON", "RETICCTPCT" in the last 72 hours. Sepsis Labs: Recent Labs  Lab 11/27/23 0354 11/27/23 4098 11/27/23 0811 11/28/23 0441 11/29/23 0846 12/03/23 0808  PROCALCITON  --   --  2.87  --   --   --   WBC  --  15.9*  --  18.2* 18.9* 19.9*  LATICACIDVEN 2.0* 1.9  --   --   --   --     Microbiology Recent Results (from the past 240 hours)  Blood culture (routine x 2)     Status: None   Collection Time: 11/27/23  3:54 AM   Specimen: BLOOD RIGHT ARM  Result Value Ref Range Status   Specimen Description BLOOD RIGHT ARM  Final   Special Requests   Final    BOTTLES DRAWN AEROBIC AND ANAEROBIC Blood Culture results may not be optimal due to an inadequate volume of blood received in culture bottles   Culture   Final    NO GROWTH 5 DAYS Performed at [facility], [address], [place], [place] [address]    Report Status 12/02/2023 FINAL  Final  Blood  culture (routine x 2)     Status: None   Collection Time: 11/27/23  3:54 AM   Specimen: BLOOD LEFT ARM  Result Value Ref Range Status   Specimen Description BLOOD LEFT ARM  Final   Special Requests   Final    BOTTLES DRAWN AEROBIC AND ANAEROBIC Blood Culture results may not be optimal due to an inadequate volume of blood received in culture bottles   Culture   Final    NO GROWTH 5 DAYS Performed at [facility], [address], [place], [place] [address]    Report Status 12/02/2023 FINAL  Final  Urine Culture     Status: None   Collection Time: 11/27/23  3:54 AM   Specimen: Urine, Catheterized  Result Value Ref Range Status   Specimen Description   Final    URINE, CATHETERIZED Performed at [facility], [address] [place], [place] [address]    Special Requests   Final    NONE Performed at [facility], [address], [place], [place] [address]    Culture   Final    NO GROWTH Performed at [facility], [address] [place], [place] [address]    Report Status 11/28/2023 FINAL  Final    Procedures and diagnostic studies:  No results found.   LOS: 6 days   Marolyn Haller MD [facility]   Pager on [email or web address]. If 7PM-7AM, please contact night-coverage at [email or web address]   12/03/2023, 5:23 PM

## 2023-12-04 DIAGNOSIS — R531 Weakness: Secondary | ICD-10-CM

## 2023-12-04 LAB — CBC
HCT: 34.1 % — ABNORMAL LOW (ref 36.0–46.0)
Hemoglobin: 12.1 g/dL (ref 12.0–15.0)
MCH: 33 pg (ref 26.0–34.0)
MCHC: 35.5 g/dL (ref 30.0–36.0)
MCV: 92.9 fL (ref 80.0–100.0)
Platelets: 210 10*3/uL (ref 150–400)
RBC: 3.67 MIL/uL — ABNORMAL LOW (ref 3.87–5.11)
RDW: 11.9 % (ref 11.5–15.5)
WBC: 18.4 10*3/uL — ABNORMAL HIGH (ref 4.0–10.5)
nRBC: 0 % (ref 0.0–0.2)

## 2023-12-04 NOTE — Progress Notes (Signed)
Progress Note    [name]  ZOX:[id number] DOB: [birth year]  DOA: 11/27/2023 PCP: Marguarite Arbour, MD      Brief Narrative:    Medical records reviewed and are as summarized below:  [name] is a 89 y.o. female with medical history significant for hypertension, GERD, peripheral neuropathy, COPD, peripheral vascular disease, dementia, who was brought to the hospital because of general weakness, urinary frequency and urgency of bowel 1 to 2 weeks duration.  She also had nausea and vomiting.  She had been given Macrobid for an outpatient diagnosis of UTI.  Urine culture from 11/21/2023 showed methicillin-resistant Staph epidermidis.  Labs significant for sodium 126, BUN 39, creatinine 1.64, troponin 61, BNP 495.5, lactic acid 2, WBC 18.2   She was admitted to the hospital for severe sepsis secondary to acute UTI, COPD exacerbation, AKI and hyponatremia.  She is medically stable for discharge   Assessment/Plan:   Principal Problem:   Sepsis due to gram-negative UTI (HCC) Active Problems:   COPD with acute exacerbation (HCC)   Hypothyroidism   GERD without esophagitis   Anxiety   Essential hypertension   Nutrition Problem: Increased nutrient needs Etiology: chronic illness (COPD)  Signs/Symptoms: estimated needs   Body mass index is 29.62 kg/m.     Severe sepsis secondary to acute UTI Resolved: Urine culture from 11/21/2023 showed methicillin-resistant Staph epidermidis.  She is status post 5-day course of vancomycin 11/27/11/20.  She also received 2 doses of IV ceftriaxone which was discontinued on 11/28/2023.  Currently without any symptoms concerning for urinary tract infection.  Persistent leukocytosis: Afebrile. Currently without symptoms concerning for infection.  Stable since yesterday.  Possibly due to steroids.       Latest Ref Rng & Units 12/04/2023    3:12 AM 12/03/2023    8:08 AM 11/29/2023    8:46 AM  CBC  WBC 4.0 - 10.5 K/uL 18.4   19.9  18.9   Hemoglobin 12.0 - 15.0 g/dL 81.1  91.4  78.2   Hematocrit 36.0 - 46.0 % 34.1  35.5  35.5   Platelets 150 - 400 K/uL 210  224  209    Continue to monitor for symptoms and signs of infection.  -Possibly due to steroids  -Hold on further antibiotics    AKI: REsolved.    Latest Ref Rng & Units 12/02/2023    8:18 AM 12/01/2023   10:42 AM 11/29/2023    7:52 AM  BMP  Glucose 70 - 99 mg/dL 93  956  93   BUN 8 - 23 mg/dL 17  22  36   Creatinine 0.44 - 1.00 mg/dL 2.13  0.86  5.78   Sodium 135 - 145 mmol/L 132  131  130   Potassium 3.5 - 5.1 mmol/L 3.8  3.4  4.3   Chloride 98 - 111 mmol/L 99  97  99   CO2 22 - 32 mmol/L 26  25  20    Calcium 8.9 - 10.3 mg/dL 8.5  8.5  8.5      Acute on chronic hyponatremia: Likely secondary to poor solute intake. Sodium level is stable and she is asymptomatic.    Latest Ref Rng & Units 12/02/2023    8:18 AM 12/01/2023   10:42 AM 11/29/2023    7:52 AM  BMP  Glucose 70 - 99 mg/dL 93  469  93   BUN 8 - 23 mg/dL 17  22  36   Creatinine 0.44 - 1.00  mg/dL 1.61  0.96  0.45   Sodium 135 - 145 mmol/L 132  131  130   Potassium 3.5 - 5.1 mmol/L 3.8  3.4  4.3   Chloride 98 - 111 mmol/L 99  97  99   CO2 22 - 32 mmol/L 26  25  20    Calcium 8.9 - 10.3 mg/dL 8.5  8.5  8.5     BLE edema Likely due to venous insufficiency  Compression stockings   COPD exacerbation: Resolved. No dyspnea, cough/sputum production.  Continue bronchodilators.  S/p Prednisone   Elevated BNP-495.5 on admission.  Low suspicion of acute CHF at this time. TTE showed normal EF, moderate mitral stenosis Euvolemic    Comorbidities include hypertension, hypothyroidism, anxiety, GERD    Diet Order             Diet regular Room service appropriate? Yes; Fluid consistency: Thin  Diet effective now                   Consultants: None  Procedures: None    Medications:    acidophilus  1 capsule Oral Daily   vitamin C  1,000 mg Oral Daily    calcium-vitamin D  1 tablet Oral BID   cholecalciferol  5,000 Units Oral Daily   [START ON 12/12/2023] cyanocobalamin  1,000 mcg Intramuscular Q30 days   enoxaparin (LOVENOX) injection  40 mg Subcutaneous Q24H   feeding supplement  237 mL Oral BID BM   folic acid  1 mg Oral Daily   Gerhardt's butt cream   Topical Daily   guaiFENesin  600 mg Oral BID   levothyroxine  50 mcg Oral Q0600   mometasone-formoterol  2 puff Inhalation BID   multivitamin with minerals  1 tablet Oral Daily   pantoprazole  40 mg Oral Daily   propranolol ER  60 mg Oral Daily   sodium chloride flush  10 mL Intravenous Q12H   Continuous Infusions:     Anti-infectives (From admission, onward)    Start     Dose/Rate Route Frequency Ordered Stop   11/28/23 0500  vancomycin (VANCOREADY) IVPB 500 mg/100 mL        500 mg 100 mL/hr over 60 Minutes Intravenous Every 24 hours 11/27/23 1649 12/02/23 0959   11/28/23 0000  vancomycin (VANCOREADY) IVPB 500 mg/100 mL  Status:  Discontinued        500 mg 100 mL/hr over 60 Minutes Intravenous  Once 11/27/23 0433 11/27/23 1649   11/27/23 0600  cefTRIAXone (ROCEPHIN) 2 g in sodium chloride 0.9 % 100 mL IVPB  Status:  Discontinued        2 g 200 mL/hr over 30 Minutes Intravenous Every 24 hours 11/27/23 0423 11/28/23 1403   11/27/23 0345  vancomycin (VANCOCIN) 1,750 mg in sodium chloride 0.9 % 500 mL IVPB        1,750 mg 258.8 mL/hr over 120 Minutes Intravenous  Once 11/27/23 0336 11/27/23 0644              Family Communication/Anticipated D/C date and plan/Code Status   DVT prophylaxis: enoxaparin (LOVENOX) injection 40 mg Start: 12/03/23 1000 Place and maintain sequential compression device Start: 12/01/23 1132     Code Status: Full Code  Family Communication: Private caregiver at the bedside Disposition Plan: Plan to discharge to SNF   Status is: Inpatient Remains inpatient appropriate because: awaiting safe discharge to SNF.    Subjective:   No SOB  or chest pain. NO dysuria.  Objective:  Vitals:   12/03/23 1558 12/03/23 2300 12/04/23 0741 12/04/23 1630  BP: 139/64 (!) 153/70 (!) 170/91 (!) 140/52  Pulse: 67 65 75 70  Resp: 16 17 18 18   Temp: 97.6 F (36.4 C) 98.1 F (36.7 C) 98.2 F (36.8 C) (!) 97.4 F (36.3 C)  TempSrc: Oral Oral    SpO2: 99% 99% 99% 100%  Weight:      Height:       No data found.  No intake or output data in the 24 hours ending 12/04/23 1904  Filed Weights   11/26/23 2208 11/27/23 2300  Weight: 81.6 kg 68.8 kg    Exam:  Physical Exam  Constitutional: In no distress.  Cardiovascular: Normal rate, regular rhythm. 2+ to the knees Pulmonary: Non labored breathing on room air, no wheezing or rales.  Abdominal: Soft. Normal bowel sounds. Non distended and non tender Musculoskeletal: Normal range of motion.     Neurological: Alert and oriented to person, place, and time. Non focal  Skin: Skin is warm and dry.   Data Reviewed:   I have personally reviewed following labs and imaging studies:  Labs: Labs show the following:   Basic Metabolic Panel: Recent Labs  Lab 11/28/23 0441 11/29/23 0752 12/01/23 1042 12/02/23 0818  NA 126* 130* 131* 132*  K 4.5 4.3 3.4* 3.8  CL 97* 99 97* 99  CO2 22 20* 25 26  GLUCOSE 113* 93 131* 93  BUN 34* 36* 22 17  CREATININE 1.11* 1.08* 0.84 0.61  CALCIUM 8.3* 8.5* 8.5* 8.5*   GFR Estimated Creatinine Clearance: 39.6 mL/min (by C-G formula based on SCr of 0.61 mg/dL). Liver Function Tests: No results for input(s): "AST", "ALT", "ALKPHOS", "BILITOT", "PROT", "ALBUMIN" in the last 168 hours. No results for input(s): "LIPASE", "AMYLASE" in the last 168 hours. No results for input(s): "AMMONIA" in the last 168 hours. Coagulation profile No results for input(s): "INR", "PROTIME" in the last 168 hours.   CBC: Recent Labs  Lab 11/28/23 0441 11/29/23 0846 12/03/23 0808 12/04/23 0312  WBC 18.2* 18.9* 19.9* 18.4*  NEUTROABS  --  14.2*  --   --   HGB  12.0 12.3 12.6 12.1  HCT 34.0* 35.5* 35.5* 34.1*  MCV 95.8 96.5 92.4 92.9  PLT 248 209 224 210   Cardiac Enzymes: No results for input(s): "CKTOTAL", "CKMB", "CKMBINDEX", "TROPONINI" in the last 168 hours. BNP (last 3 results) No results for input(s): "PROBNP" in the last 8760 hours. CBG: No results for input(s): "GLUCAP" in the last 168 hours. D-Dimer: No results for input(s): "DDIMER" in the last 72 hours. Hgb A1c: No results for input(s): "HGBA1C" in the last 72 hours. Lipid Profile: No results for input(s): "CHOL", "HDL", "LDLCALC", "TRIG", "CHOLHDL", "LDLDIRECT" in the last 72 hours. Thyroid function studies: No results for input(s): "TSH", "T4TOTAL", "T3FREE", "THYROIDAB" in the last 72 hours.  Invalid input(s): "FREET3" Anemia work up: No results for input(s): "VITAMINB12", "FOLATE", "FERRITIN", "TIBC", "IRON", "RETICCTPCT" in the last 72 hours. Sepsis Labs: Recent Labs  Lab 11/28/23 0441 11/29/23 0846 12/03/23 0808 12/04/23 0312  WBC 18.2* 18.9* 19.9* 18.4*    Microbiology Recent Results (from the past 240 hours)  Blood culture (routine x 2)     Status: None   Collection Time: 11/27/23  3:54 AM   Specimen: BLOOD RIGHT ARM  Result Value Ref Range Status   Specimen Description BLOOD RIGHT ARM  Final   Special Requests   Final    BOTTLES DRAWN AEROBIC AND ANAEROBIC Blood  Culture results may not be optimal due to an inadequate volume of blood received in culture bottles   Culture   Final    NO GROWTH 5 DAYS Performed at [facility], [address] [place], [place] [address]    Report Status 12/02/2023 FINAL  Final  Blood culture (routine x 2)     Status: None   Collection Time: 11/27/23  3:54 AM   Specimen: BLOOD LEFT ARM  Result Value Ref Range Status   Specimen Description BLOOD LEFT ARM  Final   Special Requests   Final    BOTTLES DRAWN AEROBIC AND ANAEROBIC Blood Culture results may not be optimal due to an inadequate volume of blood received  in culture bottles   Culture   Final    NO GROWTH 5 DAYS Performed at [facility], [address] [place], [place] [address]    Report Status 12/02/2023 FINAL  Final  Urine Culture     Status: None   Collection Time: 11/27/23  3:54 AM   Specimen: Urine, Catheterized  Result Value Ref Range Status   Specimen Description   Final    URINE, CATHETERIZED Performed at [facility], [address] [place], [place] [address]    Special Requests   Final    NONE Performed at [facility], [address], [place], [place] [address]    Culture   Final    NO GROWTH Performed at [facility], [address] [place], [place] [address]    Report Status 11/28/2023 FINAL  Final    Procedures and diagnostic studies:  No results found.   LOS: 7 days   Marolyn Haller MD [facility]   Pager on [email or web address]. If 7PM-7AM, please contact night-coverage at [email or web address]   12/04/2023, 7:04 PM

## 2023-12-04 NOTE — Care Management Important Message (Signed)
Important Message  Patient Details  Name: COLBEY MCBRIEN MRN: [id number] Date of Birth: 05-29-1930   Important Message Given:  Yes - Medicare IM     Bernadette Hoit 12/04/2023, 11:29 AM

## 2023-12-05 DIAGNOSIS — N390 Urinary tract infection, site not specified: Secondary | ICD-10-CM | POA: Diagnosis not present

## 2023-12-05 DIAGNOSIS — A4150 Gram-negative sepsis, unspecified: Secondary | ICD-10-CM | POA: Diagnosis not present

## 2023-12-05 MED ORDER — GERHARDT'S BUTT CREAM: 1.0000 | TOPICAL_CREAM | Freq: Every day | CUTANEOUS | Status: AC

## 2023-12-05 MED ORDER — ONDANSETRON HCL 4 MG PO TABS: 4.0000 mg | ORAL_TABLET | Freq: Four times a day (QID) | ORAL | Status: AC | PRN

## 2023-12-05 MED ORDER — ESTRADIOL 0.1 MG/GM VA CREA: 1.0000 | TOPICAL_CREAM | VAGINAL | Status: AC

## 2023-12-05 MED ORDER — ACETAMINOPHEN 325 MG PO TABS: 650.0000 mg | ORAL_TABLET | Freq: Four times a day (QID) | ORAL | Status: AC | PRN

## 2023-12-05 MED ORDER — MAGNESIUM HYDROXIDE 400 MG/5ML PO SUSP: 30.0000 mL | Freq: Every day | ORAL | Status: AC | PRN

## 2023-12-05 NOTE — TOC Transition Note (Signed)
Transition of Care [facility]) - Discharge Note   Patient Details  Name: [name] MRN: [id number] Date of Birth: December 08, 1930  [facility] CM/SW Contact:  Hetty Ely, RN Phone Number: 12/05/2023, 12:32 PM   Clinical Narrative:   To discharge to [facility]. Spoke with Son who's receptive. Nurse to call report to [facility], Nurse on [address], [phone]    Final next level of care: Skilled Nursing Facility Barriers to Discharge: Barriers Resolved   Patient Goals and CMS Choice   CMS Medicare.gov Compare Post Acute Care list provided to:: Legal Guardian Choice offered to / list presented to : Adult Children      Discharge Placement                Patient to be transferred to facility by: [facility] Name of family member notified: Son Minerva Areola Patient and family notified of of transfer: 12/05/23  Discharge Plan and Services Additional resources added to the After Visit Summary for     Discharge Planning Services: CM Consult            DME Arranged: N/A DME Agency: NA       HH Arranged: NA HH Agency: NA        Social Drivers of Health (SDOH) Interventions SDOH Screenings   Food Insecurity: Patient Unable To Answer (11/27/2023)  Housing: Patient Unable To Answer (11/27/2023)  Transportation Needs: Patient Unable To Answer (11/27/2023)  Utilities: Patient Unable To Answer (11/27/2023)  Tobacco Use: Low Risk  (11/26/2023)     Readmission Risk Interventions    07/25/2021   10:49 AM  Readmission Risk Prevention Plan  Transportation Screening Complete  PCP or Specialist Appt within 5-7 Days Complete  Home Care Screening Complete  Medication Review (RN CM) Complete

## 2023-12-05 NOTE — Discharge Summary (Signed)
Physician Discharge Summary   Patient: [name] MRN: [id number]  DOB: 26-Jul-1930   Admit:     Date of Admission: 11/27/2023 Admitted from: home   Discharge: Date of discharge: 12/05/23 Disposition: Skilled nursing facility Condition at discharge: good  CODE STATUS: FULL CODE     Discharge Physician: Sunnie Nielsen, DO [facility]     PCP: Marguarite Arbour, MD  Recommendations for Outpatient Follow-up:  Follow up with PCP Marguarite Arbour, MD in 1-2 weeks, recheck CBC, BMP    Discharge Instructions     Diet general   Complete by: As directed    Increase activity slowly   Complete by: As directed          Discharge Diagnoses: Principal Problem:   Sepsis due to gram-negative UTI [facility]) Active Problems:   COPD with acute exacerbation (HCC)   Hypothyroidism   GERD without esophagitis   Anxiety   Essential hypertension      HPI: [name] is a 89 y.o. Caucasian female with medical history significant for essential hypertension, GERD, peripheral neuropathy, COPD and and PVD, who presented to the ED with acute onset of generalized weakness with urinary frequency and urgency without dysuria or hematuria or flank pain, over the last 1 to 2 weeks, w/ nausea and vomiting during the last week. She was given Macrobid for an outpatient diagnosis UTI and urine culture grew Staph epidermidis on 12/10.   Hospital course / significant events:  12/16: to ED, admitted to hospitalist service w/ sepsis d/t UTI, COPD exacerbation. Fluid overload after IV fluids, possible underlying HFpEF (Echo 04/2023 normal EF w/ indeterminate diastolic fxn, moderate MS).  12/17: continue abx 12/18-12/24: pending SNF placement for rehab.   Consultants:  none  Procedures/Surgeries: none      ASSESSMENT & PLAN:   Severe sepsis d/t UTI - resolved S/p abx treatment Medically stable  Recheck as needed  Persistent leukocytosis likely d/t steroids for COPD  exacerbation Trending down  No s/s infection at this time  Monitor CBC  AKI resolved Monitor BMP   COPD exacerbation resolved Completed prednisone  Continue home dulera Bronchodilators prn  Guaifenesin prn   Elevated BNP-495.5 on admission.  Low suspicion of acute CHF at this time. TTE showed normal EF, moderate mitral stenosis Euvolemic  Montior volume status   Acute on chronic hyponatremia Improved  Monitor BMP  Hypokalemia Replace as needed Monitor BMP  BLE edema Venous insufficiency  Continue compression   HTN Propranolol   Hypothyroid Synthroid  GERD Po PPI             Discharge Instructions  Allergies as of 12/05/2023       Reactions   Codeine Nausea And Vomiting   Doxycycline Other (See Comments)   Flu-like s/s   Morphine Nausea And Vomiting, Anxiety   Hallucinations and anxiety   Tramadol Nausea And Vomiting, Other (See Comments)   Causes poor balance   Anakinra    Celecoxib    Hydroxychloroquine    Other reaction(s): Unknown   Adhesive [tape] Rash   Amitriptyline Other (See Comments)   Caused nervousness. Patient states she has never taken this.   Ciprofloxacin Diarrhea   Clarithromycin Nausea Only   Flu like symptoms   Erythromycin Rash   Itraconazole Other (See Comments)   Elevated liver enzymes   Nsaids Nausea Only   Unknown   Penicillins Rash   Hives also. Doctor told her to never ever take pcn unless it  is her only option   Zoloft [sertraline Hcl] Other (See Comments)   Patient unaware of any reaction. Probably upset stomach        Medication List     STOP taking these medications    CALMME PO   cefadroxil 500 MG capsule Commonly known as: DURICEF   clobetasol cream 0.05 % Commonly known as: TEMOVATE   hydrocortisone 2.5 % cream   ketoconazole 2 % cream Commonly known as: NIZORAL   mupirocin ointment 2 % Commonly known as: BACTROBAN   oseltamivir 30 MG capsule Commonly known as: TAMIFLU        TAKE these medications    acetaminophen 325 MG tablet Commonly known as: TYLENOL Take 2 tablets (650 mg total) by mouth every 6 (six) hours as needed for mild pain (pain score 1-3) (or Fever >/= 101). What changed:  medication strength how much to take when to take this reasons to take this   albuterol 108 (90 Base) MCG/ACT inhaler Commonly known as: VENTOLIN HFA Inhale 2 puffs into the lungs every 6 (six) hours as needed.   ALPRAZolam 0.25 MG tablet Commonly known as: XANAX Take 1 tablet (0.25 mg total) by mouth 2 (two) times daily as needed for anxiety.   Calcium-D 600-400 MG-UNIT Tabs Take 1 tablet by mouth 2 (two) times daily.   cyanocobalamin 1000 MCG/ML injection Commonly known as: VITAMIN B12 Inject 1,000 mcg into the muscle every 30 (thirty) days.   estradiol 0.1 MG/GM vaginal cream Commonly known as: ESTRACE Place 1 Applicatorful vaginally 3 (three) times a week. Start taking on: December 06, 2023 What changed:  when to take this reasons to take this   FIBER-CAPS PO Take 1 capsule by mouth at bedtime.   fluticasone-salmeterol 250-50 MCG/ACT Aepb Commonly known as: ADVAIR Inhale 1 puff into the lungs in the morning and at bedtime.   folic acid 1 MG tablet Commonly known as: FOLVITE Take 1 mg by mouth daily.   Gerhardt's butt cream Crea Apply 1 Application topically daily. Start taking on: December 06, 2023   levothyroxine 50 MCG tablet Commonly known as: SYNTHROID Take 50 mcg by mouth daily.   magnesium hydroxide 400 MG/5ML suspension Commonly known as: MILK OF MAGNESIA Take 30 mLs by mouth daily as needed for mild constipation.   multivitamin tablet Take 1 tablet by mouth daily.   omeprazole 40 MG capsule Commonly known as: PRILOSEC Take 40 mg by mouth daily before breakfast.   ondansetron 4 MG tablet Commonly known as: ZOFRAN Take 1 tablet (4 mg total) by mouth every 6 (six) hours as needed for nausea. What changed:  when to take  this reasons to take this   PHILLIPS COLON HEALTH PO Take 1 tablet by mouth daily.   propranolol ER 60 MG 24 hr capsule Commonly known as: INDERAL LA Take 60 mg by mouth daily.   vitamin C 1000 MG tablet Take 1,000 mg by mouth daily.   Vitamin D-3 125 MCG (5000 UT) Tabs Take 5,000 Units by mouth daily.         Contact information for after-discharge care     Destination     [facility] [place] Preferred SNF .   Service: Skilled Nursing Contact information: [address] [place] [address] [phone]                     Allergies  Allergen Reactions   Codeine Nausea And Vomiting   Doxycycline Other (See Comments)  Flu-like s/s   Morphine Nausea And Vomiting and Anxiety    Hallucinations and anxiety   Tramadol Nausea And Vomiting and Other (See Comments)    Causes poor balance   Anakinra    Celecoxib    Hydroxychloroquine     Other reaction(s): Unknown   Adhesive [Tape] Rash   Amitriptyline Other (See Comments)    Caused nervousness. Patient states she has never taken this.   Ciprofloxacin Diarrhea   Clarithromycin Nausea Only    Flu like symptoms   Erythromycin Rash   Itraconazole Other (See Comments)    Elevated liver enzymes   Nsaids Nausea Only    Unknown   Penicillins Rash    Hives also. Doctor told her to never ever take pcn unless it is her only option   Zoloft [Sertraline Hcl] Other (See Comments)    Patient unaware of any reaction. Probably upset stomach     Subjective: pt reports feeling well today, no CP/SOB, tolerating diet, pain controlled   Discharge Exam: BP (!) 160/77 (BP Location: Left Arm)   Pulse 71   Temp 98.4 F (36.9 C)   Resp 16   Ht 5' (1.524 m)   Wt 68.8 kg   SpO2 93%   BMI 29.62 kg/m  General: Pt is alert, awake, not in acute distress Cardiovascular: RRR, S1/S2 +, no rubs, no gallops Respiratory: CTA bilaterally, no wheezing, no rhonchi Abdominal: Soft, NT, ND,  bowel sounds + Extremities: no edema, no cyanosis     The results of significant diagnostics from this hospitalization (including imaging, microbiology, ancillary and laboratory) are listed below for reference.     Microbiology: Recent Results (from the past 240 hours)  Blood culture (routine x 2)     Status: None   Collection Time: 11/27/23  3:54 AM   Specimen: BLOOD RIGHT ARM  Result Value Ref Range Status   Specimen Description BLOOD RIGHT ARM  Final   Special Requests   Final    BOTTLES DRAWN AEROBIC AND ANAEROBIC Blood Culture results may not be optimal due to an inadequate volume of blood received in culture bottles   Culture   Final    NO GROWTH 5 DAYS Performed at [facility], [address], [place], [place] [address]    Report Status 12/02/2023 FINAL  Final  Blood culture (routine x 2)     Status: None   Collection Time: 11/27/23  3:54 AM   Specimen: BLOOD LEFT ARM  Result Value Ref Range Status   Specimen Description BLOOD LEFT ARM  Final   Special Requests   Final    BOTTLES DRAWN AEROBIC AND ANAEROBIC Blood Culture results may not be optimal due to an inadequate volume of blood received in culture bottles   Culture   Final    NO GROWTH 5 DAYS Performed at [facility], [address], [place], [place] [address]    Report Status 12/02/2023 FINAL  Final  Urine Culture     Status: None   Collection Time: 11/27/23  3:54 AM   Specimen: Urine, Catheterized  Result Value Ref Range Status   Specimen Description   Final    URINE, CATHETERIZED Performed at [facility], [address], [place], [place] [address]    Special Requests   Final    NONE Performed at [facility], [address], [place], [place] [address]    Culture   Final    NO GROWTH Performed at [facility],  [address] [place], [place] [address]    Report Status 11/28/2023 FINAL  Final     Labs: BNP (last 3 results) Recent Labs     05/07/23 1320 11/27/23 0608  BNP 573.1* 495.5*   Basic Metabolic Panel: Recent Labs  Lab 11/29/23 0752 12/01/23 1042 12/02/23 0818  NA 130* 131* 132*  K 4.3 3.4* 3.8  CL 99 97* 99  CO2 20* 25 26  GLUCOSE 93 131* 93  BUN 36* 22 17  CREATININE 1.08* 0.84 0.61  CALCIUM 8.5* 8.5* 8.5*   Liver Function Tests: No results for input(s): "AST", "ALT", "ALKPHOS", "BILITOT", "PROT", "ALBUMIN" in the last 168 hours. No results for input(s): "LIPASE", "AMYLASE" in the last 168 hours. No results for input(s): "AMMONIA" in the last 168 hours. CBC: Recent Labs  Lab 11/29/23 0846 12/03/23 0808 12/04/23 0312  WBC 18.9* 19.9* 18.4*  NEUTROABS 14.2*  --   --   HGB 12.3 12.6 12.1  HCT 35.5* 35.5* 34.1*  MCV 96.5 92.4 92.9  PLT 209 224 210   Cardiac Enzymes: No results for input(s): "CKTOTAL", "CKMB", "CKMBINDEX", "TROPONINI" in the last 168 hours. BNP: Invalid input(s): "POCBNP" CBG: No results for input(s): "GLUCAP" in the last 168 hours. D-Dimer No results for input(s): "DDIMER" in the last 72 hours. Hgb A1c No results for input(s): "HGBA1C" in the last 72 hours. Lipid Profile No results for input(s): "CHOL", "HDL", "LDLCALC", "TRIG", "CHOLHDL", "LDLDIRECT" in the last 72 hours. Thyroid function studies No results for input(s): "TSH", "T4TOTAL", "T3FREE", "THYROIDAB" in the last 72 hours.  Invalid input(s): "FREET3" Anemia work up No results for input(s): "VITAMINB12", "FOLATE", "FERRITIN", "TIBC", "IRON", "RETICCTPCT" in the last 72 hours. Urinalysis    Component Value Date/Time   COLORURINE AMBER (A) 11/27/2023 0354   APPEARANCEUR HAZY (A) 11/27/2023 0354   LABSPEC 1.017 11/27/2023 0354   PHURINE 5.0 11/27/2023 0354   GLUCOSEU NEGATIVE 11/27/2023 0354   HGBUR NEGATIVE 11/27/2023 0354   BILIRUBINUR NEGATIVE 11/27/2023 0354   KETONESUR 5 (A) 11/27/2023 0354   PROTEINUR 30 (A) 11/27/2023 0354   NITRITE NEGATIVE 11/27/2023 0354   LEUKOCYTESUR NEGATIVE 11/27/2023 0354    Sepsis Labs Recent Labs  Lab 11/29/23 0846 12/03/23 0808 12/04/23 0312  WBC 18.9* 19.9* 18.4*   Microbiology Recent Results (from the past 240 hours)  Blood culture (routine x 2)     Status: None   Collection Time: 11/27/23  3:54 AM   Specimen: BLOOD RIGHT ARM  Result Value Ref Range Status   Specimen Description BLOOD RIGHT ARM  Final   Special Requests   Final    BOTTLES DRAWN AEROBIC AND ANAEROBIC Blood Culture results may not be optimal due to an inadequate volume of blood received in culture bottles   Culture   Final    NO GROWTH 5 DAYS Performed at [facility], [address] [place], [place] [address]    Report Status 12/02/2023 FINAL  Final  Blood culture (routine x 2)     Status: None   Collection Time: 11/27/23  3:54 AM   Specimen: BLOOD LEFT ARM  Result Value Ref Range Status   Specimen Description BLOOD LEFT ARM  Final   Special Requests   Final    BOTTLES DRAWN AEROBIC AND ANAEROBIC Blood Culture results may not be optimal due to an inadequate volume of blood received in culture bottles   Culture   Final    NO GROWTH 5 DAYS Performed at [facility], [address] [place], [place] [address]    Report Status 12/02/2023 FINAL  Final  Urine Culture     Status: None   Collection Time: 11/27/23  3:54 AM   Specimen: Urine, Catheterized  Result Value Ref Range Status   Specimen Description   Final    URINE, CATHETERIZED Performed at [facility], [address], [place], [place] [address]    Special Requests   Final    NONE Performed at [facility], [address], [place], [place] [address]    Culture   Final    NO GROWTH Performed at [facility], [address], [place], [place] [address]    Report Status 11/28/2023 FINAL  Final   Imaging DG Chest 2 View Result Date: 11/27/2023 CLINICAL DATA:  Weakness EXAM: CHEST - 2 VIEW COMPARISON:  05/11/2023 FINDINGS: Left-sided pacing device. Small pleural  effusions. Borderline cardiomegaly. Aortic atherosclerosis. No pneumothorax. Hardware in the cervical spine IMPRESSION: Small pleural effusions. Borderline cardiomegaly. Electronically Signed   By: [name] Pang M.D.   On: 11/27/2023 00:10      Time coordinating discharge: over 30 minutes  SIGNED:  Sunnie Nielsen DO [facility]

## 2023-12-05 NOTE — Progress Notes (Signed)
Nutrition Follow-up  DOCUMENTATION CODES:   Not applicable  INTERVENTION:   -Continue Ensure Enlive po BID, each supplement provides 350 kcal and 20 grams of protein.  -Continue MVI with minerals daily -Continue regular diet -RD will sign off secondary to medical stability; if further nutrition-related concerns arise, please re-consult RD  NUTRITION DIAGNOSIS:   Increased nutrient needs related to chronic illness (COPD) as evidenced by estimated needs.  Ongoing  GOAL:   Patient will meet greater than or equal to 90% of their needs  Progressing   MONITOR:   PO intake, Supplement acceptance  REASON FOR ASSESSMENT:   Consult Assessment of nutrition requirement/status  ASSESSMENT:   Pt with medical history significant for essential hypertension, GERD, peripheral neuropathy, COPD and and peripheral vascular disease, who presented with acute onset of generalized weakness with recent urinary frequency and urgency without dysuria or hematuria or flank pain, over the last 1 to 2 weeks PTA.  Reviewed I/O's: -264 ml x 24 hours   Pt unavailable at time of visit. Attempted to speak with pt via call to hospital room phone, however, unable to reach.   Pt remains on a regular diet. Noted meal completions 50-80%. Pt is also drinking Ensure supplements.   No new wt since last visit.  Per MD notes, pt is medically stable for discharge and awaiting SNF placement.   Medications reviewed and include vitamin C, vitamin D, vitamin B-12, lovenox, folic acid, mucinex, and protonix.   Labs reviewed: Na: 132. CBGS: 95.    Diet Order:   Diet Order             Diet regular Room service appropriate? Yes; Fluid consistency: Thin  Diet effective now                   EDUCATION NEEDS:   Not appropriate for education at this time  Skin:  Skin Assessment: Reviewed RN Assessment  Last BM:  11/26/23  Height:   Ht Readings from Last 1 Encounters:  11/27/23 5' (1.524 m)     Weight:   Wt Readings from Last 1 Encounters:  11/27/23 68.8 kg    Ideal Body Weight:  45.5 kg  BMI:  Body mass index is 29.62 kg/m.  Estimated Nutritional Needs:   Kcal:  1550-1750  Protein:  75-90 grams  Fluid:  > 1.5 L    Levada Schilling, RD, LDN, CDCES Registered Dietitian III Certified Diabetes Care and Education Specialist If unable to reach this RD, please use "RD Inpatient" group chat on secure chat between hours of 8am-4 pm daily

## 2023-12-05 NOTE — TOC Progression Note (Signed)
Transition of Care [facility]) - Progression Note    Patient Details  Name: FATEMAH MEHROTRA MRN: [id number] Date of Birth: [birth year]  Transition of [facility] CM/SW Contact  Hetty Ely, RN Phone Number: 12/05/2023, 11:10 AM  Clinical Narrative:  Spoke with Son, Minerva Areola about SNF bed offers, he prefers [facility], says he spoke with Administrator there. CM called [facility] spoke with Ms. Jimmey Ralph, who says she will review Referral and call back.    Expected Discharge Plan: Skilled Nursing Facility Barriers to Discharge: SNF Pending bed offer  Expected Discharge Plan and Services   Discharge Planning Services: CM Consult   Living arrangements for the past 2 months: Single Family Home                 DME Arranged: N/A DME Agency: NA       HH Arranged: NA           Social Determinants of Health (SDOH) Interventions SDOH Screenings   Food Insecurity: Patient Unable To Answer (11/27/2023)  Housing: Patient Unable To Answer (11/27/2023)  Transportation Needs: Patient Unable To Answer (11/27/2023)  Utilities: Patient Unable To Answer (11/27/2023)  Tobacco Use: Low Risk  (11/26/2023)    Readmission Risk Interventions    07/25/2021   10:49 AM  Readmission Risk Prevention Plan  Transportation Screening Complete  PCP or Specialist Appt within 5-7 Days Complete  Home Care Screening Complete  Medication Review (RN CM) Complete

## 2023-12-05 NOTE — Plan of Care (Signed)

## 2023-12-05 NOTE — Plan of Care (Signed)
  Problem: Health Behavior/Discharge Planning: Goal: Ability to manage health-related needs will improve Outcome: Progressing   Problem: Clinical Measurements: Goal: Ability to maintain clinical measurements within normal limits will improve Outcome: Progressing Goal: Will remain free from infection Outcome: Progressing Goal: Diagnostic test results will improve Outcome: Progressing   

## 2024-02-27 ENCOUNTER — Emergency Department

## 2024-02-27 ENCOUNTER — Encounter: Payer: Self-pay | Admitting: Emergency Medicine

## 2024-02-27 ENCOUNTER — Emergency Department
Admission: EM | Admit: 2024-02-27 | Discharge: 2024-02-27 | Disposition: A | Discharge status: Home / Self Care | Attending: Emergency Medicine | Admitting: Emergency Medicine

## 2024-02-27 ENCOUNTER — Other Ambulatory Visit: Payer: Self-pay

## 2024-02-27 DIAGNOSIS — I10 Essential (primary) hypertension: Secondary | ICD-10-CM | POA: Insufficient documentation

## 2024-02-27 DIAGNOSIS — R410 Disorientation, unspecified: Secondary | ICD-10-CM | POA: Diagnosis present

## 2024-02-27 LAB — CBC
HCT: 40.4 % (ref 36.0–46.0)
Hemoglobin: 13.5 g/dL (ref 12.0–15.0)
MCH: 31.8 pg (ref 26.0–34.0)
MCHC: 33.4 g/dL (ref 30.0–36.0)
MCV: 95.1 fL (ref 80.0–100.0)
Platelets: 240 10*3/uL (ref 150–400)
RBC: 4.25 MIL/uL (ref 3.87–5.11)
RDW: 12.4 % (ref 11.5–15.5)
WBC: 11.8 10*3/uL — ABNORMAL HIGH (ref 4.0–10.5)
nRBC: 0 % (ref 0.0–0.2)

## 2024-02-27 LAB — COMPREHENSIVE METABOLIC PANEL
ALT: 25 U/L (ref 0–44)
AST: 25 U/L (ref 15–41)
Albumin: 3.1 g/dL — ABNORMAL LOW (ref 3.5–5.0)
Alkaline Phosphatase: 71 U/L (ref 38–126)
Anion gap: 10 (ref 5–15)
BUN: 18 mg/dL (ref 8–23)
CO2: 24 mmol/L (ref 22–32)
Calcium: 9.6 mg/dL (ref 8.9–10.3)
Chloride: 94 mmol/L — ABNORMAL LOW (ref 98–111)
Creatinine, Ser: 0.89 mg/dL (ref 0.44–1.00)
GFR, Estimated: >60 mL/min (ref >60)
Glucose, Bld: 99 mg/dL (ref 70–99)
Potassium: 4.2 mmol/L (ref 3.5–5.1)
Sodium: 128 mmol/L — ABNORMAL LOW (ref 135–145)
Total Bilirubin: 0.9 mg/dL (ref 0.0–1.2)
Total Protein: 6.4 g/dL — ABNORMAL LOW (ref 6.5–8.1)

## 2024-02-27 LAB — URINALYSIS, ROUTINE W REFLEX MICROSCOPIC
Bilirubin Urine: NEGATIVE
Glucose, UA: NEGATIVE mg/dL
Hgb urine dipstick: NEGATIVE
Ketones, ur: 5 mg/dL — AB
Leukocytes,Ua: NEGATIVE
Nitrite: NEGATIVE
Protein, ur: NEGATIVE mg/dL
Specific Gravity, Urine: 1.016 (ref 1.005–1.030)
pH: 6 (ref 5.0–8.0)

## 2024-02-27 MED ORDER — SODIUM CHLORIDE 0.9 % IV BOLUS
500.0000 mL | Freq: Once | INTRAVENOUS | Status: AC
  Administered 2024-02-27: 500 mL via INTRAVENOUS

## 2024-02-27 MED ORDER — SODIUM CHLORIDE 0.9 % IV BOLUS: 1000.0000 mL | Freq: Once | INTRAVENOUS | Status: DC

## 2024-02-27 NOTE — ED Notes (Signed)
 First Nurse Note: Pt to ED via [facility] from [facility]. Pt has been more confused than normal per staff. Pt is being treated for a UTI. Pt fell 4 days ago. Pt has been confused since falling. Pt A & O X 2.   CBG 104 HR 68 SpO2 97% Bp- 151/72.

## 2024-02-27 NOTE — ED Triage Notes (Signed)
 Patient to ED via [facility] from [facility] for AMS. PT dx with UTI 1 week ago- taking antibiotic for same. Son states confusion getting worse not better. Patient alert to person and place.

## 2024-02-27 NOTE — Discharge Instructions (Signed)
 Your workup in the emergency department today has shown a normal CT scan of your head, normal blood work as well as a normal urine sample without signs of infection.  Please follow-up with your primary care doctor regarding today's ER visit and your confusion concerns.  Return to the emergency department for any symptom concerning to yourself or staff members.

## 2024-02-27 NOTE — ED Provider Notes (Signed)
 [facility] Provider Note    Event Date/Time   First MD Initiated Contact with Patient 02/27/24 1511     (approximate)  History   Chief Complaint: Altered Mental Status  HPI  [name] is a 89 y.o. female with a past medical history of anemia, hypertension, presents to the emergency department for increased confusion.  According to the triage note patient is coming from [facility] nursing facility for increased confusion.  Patient was diagnosed with urinary tract infection approximately 1 week ago and is taking an antibiotic for this.  Son states over the past week that the confusion seems to be worsening and not improving.  Here the patient is awake alert she is oriented to person and place but not time.  Patient denies any symptoms.  Does not know why she is here.  Physical Exam   Triage Vital Signs: ED Triage Vitals  Encounter Vitals Group     BP 02/27/24 1127 130/71     Systolic BP Percentile --      Diastolic BP Percentile --      Pulse Rate 02/27/24 1127 70     Resp 02/27/24 1127 18     Temp 02/27/24 1138 (!) 97.3 F (36.3 C)     Temp Source 02/27/24 1138 Axillary     SpO2 02/27/24 1127 100 %     Weight 02/27/24 1127 149 lb 14.6 oz (68 kg)     Height 02/27/24 1127 5' (1.524 m)     Head Circumference --      Peak Flow --      Pain Score 02/27/24 1127 0     Pain Loc --      Pain Education --      Exclude from Growth Chart --     Most recent vital signs: Vitals:   02/27/24 1127 02/27/24 1138  BP: 130/71   Pulse: 70   Resp: 18   Temp:  (!) 97.3 F (36.3 C)  SpO2: 100%     General: Awake, no distress.  CV:  Good peripheral perfusion.  Regular rate and rhythm  Resp:  Normal effort.  Equal breath sounds bilaterally.  Abd:  No distention.  Soft, nontender.  No rebound or guarding.  Benign abdomen.  ED Results / Procedures / Treatments   EKG  EKG viewed and interpreted by myself shows what appears to be an atrial sensed ventricular  paced rhythm at 71 bpm with a widened QRS, left axis deviation, largely normal intervals with nonspecific ST changes.  RADIOLOGY  I have reviewed interpret the CT head images.  No bleed seen on my evaluation. Radiology for the CT scan is negative   MEDICATIONS ORDERED IN ED: Medications  sodium chloride 0.9 % bolus 1,000 mL (has no administration in time range)     IMPRESSION / MDM / ASSESSMENT AND PLAN / ED COURSE  I reviewed the triage vital signs and the nursing notes.  Patient's presentation is most consistent with acute presentation with potential threat to life or bodily function.  Patient presents to the emergency department for 1 week of worsening confusion.  Overall the patient appears well she is awake alert answering questions appropriately, pleasant.  Patient is unable to tell me why she is here but denies any medical complaints or symptoms.  Patient's lab work shows a reassuring CBC, chemistry shows mild hyponatremia at 128 however in reviewing the patient's historical values she tends to stay around 130 making this value today less  significant.  Given the patient's recent urinary tract infection on antibiotics for the same we will obtain a urine sample to evaluate for possible resistant infection.  Will also obtain a CT scan of the head.  Given the patient's slight hyponatremia we will dose 500 cc of normal saline.  Awaiting son's arrival to discuss in further detail as he has briefly stepped out.  Patient's urinalysis has resulted normal.  No leukocytes no nitrates no other sign of infection.  Given the patient's reassuring workup I believe the patient is safe for discharge home.  Discussed results with the son he is agreeable.  Will follow-up with their PCP at [facility].  FINAL CLINICAL IMPRESSION(S) / ED DIAGNOSES   Confusion  Note:  This document was prepared using Dragon voice recognition software and may include unintentional dictation errors.   Minna Antis,  MD 02/27/24 1715

## 2024-04-02 ENCOUNTER — Ambulatory Visit: Attending: Cardiology | Admitting: Cardiology

## 2024-04-02 NOTE — Progress Notes (Deleted)
  Electrophysiology Office Note:   Date:  04/02/2024  ID:  [name], DOB 01/24/1930, MRN [id number]  Primary Cardiologist: None Electrophysiologist: Ardeen Kohler, MD  {Click to update primary MD,subspecialty MD or APP then REFRESH:1}    History of Present Illness:   [name] is a 89 y.o. female with h/o HTN, GERD, peripheral neuropathy, COPD, UTIs, complete heart block s/p pacemaker who is being seen today to establish care for her device.  Discussed the use of AI scribe software for clinical note transcription with the patient, who gave verbal consent to proceed.  History of Present Illness     Review of systems complete and found to be negative unless listed in HPI.   EP Information / Studies Reviewed:    EKG is not ordered today. EKG from 02/27/24 reviewed which showed AS-VP with QRS .      Echo 05/08/23:  1. Left ventricular ejection fraction, by estimation, is 65 to 70%. The  left ventricle has normal function. The left ventricle has no regional  wall motion abnormalities. Left ventricular diastolic function could not  be evaluated.   2. Pulmonary artery pressure is at least mildly elevated (RVSP 30-35 mmHg  plus central venous pressure). Right ventricular systolic function is  normal. The right ventricular size is normal.   3. Left atrial size was mildly dilated.   4. The mitral valve is abnormal. Trivial mitral valve regurgitation.  There appears to be at least moderate mitral valve stenosis based on  pressure gradient, though evaluation is limited mitral stenosis. The mean  mitral valve gradient is 9.0 mmHg. Severe   mitral annular calcification.   5. The aortic valve has an indeterminant number of cusps. There is mild  thickening of the aortic valve. Aortic valve regurgitation is not  visualized. Aortic valve sclerosis is present, with no evidence of aortic  valve stenosis.   6. Pulmonic valve regurgitation not well assessed.   Physical Exam:   VS:   There were no vitals taken for this visit.   Wt Readings from Last 3 Encounters:  02/27/24 149 lb 14.6 oz (68 kg)  11/27/23 151 lb 10.8 oz (68.8 kg)  05/16/23 138 lb 0.1 oz (62.6 kg)     GEN: Well nourished, well developed in no acute distress NECK: No JVD CARDIAC: {EPRHYTHM:28826}, no murmurs, rubs, gallops RESPIRATORY:  Clear to auscultation without rales, wheezing or rhonchi  ABDOMEN: Soft, non-distended EXTREMITIES:  No edema; No deformity   ASSESSMENT AND PLAN:    #. S/p dual chamber MDT pacemaker:  #. Complete heart block:  - In clinic device interrogation was performed.  Appropriate device function and stable lead parameters.  #. Moderate mitral stenosis:  -Heart rates on device check*** - Continue propranolol  60 mg daily..   #. Hypertension *** goal today.  Recommend checking blood pressures 1-2 times per week at home and recording the values.  Recommend bringing these recordings to the primary care physician.   Follow up with {WUJWJ:19147} {EPFOLLOW WG:95621}  Signed, Ardeen Kohler, MD

## 2024-12-27 ENCOUNTER — Telehealth: Payer: Self-pay | Admitting: Internal Medicine

## 2024-12-27 NOTE — Telephone Encounter (Signed)
 Called to confirm/remind patient of their appointment at [facility] on 12/30/[facility].   Appointment:   [] Confirmed  [] Left mess   [] No answer/No voice mail  [] VM Full/unable to leave message  [x] Phone not in service  Patient reminded to bring all medications and/or complete list.  Confirmed patient has transportation. Gave directions, instructed to utilize valet parking.

## 2024-12-30 ENCOUNTER — Telehealth: Payer: Self-pay

## 2024-12-30 ENCOUNTER — Ambulatory Visit: Admitting: Internal Medicine

## 2024-12-30 DIAGNOSIS — I442 Atrioventricular block, complete: Secondary | ICD-10-CM

## 2024-12-30 DIAGNOSIS — Z950 Presence of cardiac pacemaker: Secondary | ICD-10-CM

## 2024-12-30 DIAGNOSIS — I10 Essential (primary) hypertension: Secondary | ICD-10-CM

## 2024-12-30 NOTE — Telephone Encounter (Signed)
 Pt previously followed up with Dr. Auston in [place]. He retired and son asked Fredick to follow up with Cardiology. She ended up on Dr. Nelle schedule. Per Dr. Bensimhon, pt need to be seen by general cardiology.

## 2025-01-09 NOTE — Telephone Encounter (Signed)
 Call x1; spoke w/ Fredick to schedule referral for patient to establish care for her MDT PPM per Dr. Cherrie, the staff that schedules transportation is currently out of the office, they will provide transportation staff my direct number to return call to discuss scheduling.

## 2025-04-08 ENCOUNTER — Ambulatory Visit: Admitting: Cardiology
